# Patient Record
Sex: Female | Born: 1935 | Race: White | Hispanic: No | Smoking: Former smoker
Health system: Southern US, Community
[De-identification: ages and names within clinical notes are randomized; demographics above are authoritative.]

## PROBLEM LIST (undated history)

## (undated) DIAGNOSIS — J449 Chronic obstructive pulmonary disease, unspecified: Secondary | ICD-10-CM

## (undated) DIAGNOSIS — M160 Bilateral primary osteoarthritis of hip: Secondary | ICD-10-CM

## (undated) DIAGNOSIS — I480 Paroxysmal atrial fibrillation: Secondary | ICD-10-CM

## (undated) DIAGNOSIS — J309 Allergic rhinitis, unspecified: Secondary | ICD-10-CM

## (undated) DIAGNOSIS — J189 Pneumonia, unspecified organism: Secondary | ICD-10-CM

## (undated) DIAGNOSIS — N390 Urinary tract infection, site not specified: Secondary | ICD-10-CM

## (undated) DIAGNOSIS — M810 Age-related osteoporosis without current pathological fracture: Secondary | ICD-10-CM

## (undated) DIAGNOSIS — N3281 Overactive bladder: Secondary | ICD-10-CM

## (undated) DIAGNOSIS — R209 Unspecified disturbances of skin sensation: Secondary | ICD-10-CM

## (undated) DIAGNOSIS — S2239XA Fracture of one rib, unspecified side, initial encounter for closed fracture: Secondary | ICD-10-CM

## (undated) DIAGNOSIS — I872 Venous insufficiency (chronic) (peripheral): Secondary | ICD-10-CM

## (undated) DIAGNOSIS — M51369 Other intervertebral disc degeneration, lumbar region without mention of lumbar back pain or lower extremity pain: Secondary | ICD-10-CM

## (undated) DIAGNOSIS — E039 Hypothyroidism, unspecified: Secondary | ICD-10-CM

## (undated) DIAGNOSIS — N993 Prolapse of vaginal vault after hysterectomy: Secondary | ICD-10-CM

## (undated) DIAGNOSIS — N3021 Other chronic cystitis with hematuria: Secondary | ICD-10-CM

## (undated) DIAGNOSIS — N182 Chronic kidney disease, stage 2 (mild): Secondary | ICD-10-CM

## (undated) DIAGNOSIS — K529 Noninfective gastroenteritis and colitis, unspecified: Secondary | ICD-10-CM

## (undated) DIAGNOSIS — M48062 Spinal stenosis, lumbar region with neurogenic claudication: Secondary | ICD-10-CM

## (undated) DIAGNOSIS — H8110 Benign paroxysmal vertigo, unspecified ear: Secondary | ICD-10-CM

## (undated) DIAGNOSIS — B029 Zoster without complications: Secondary | ICD-10-CM

## (undated) DIAGNOSIS — Z9981 Dependence on supplemental oxygen: Secondary | ICD-10-CM

## (undated) DIAGNOSIS — G2581 Restless legs syndrome: Secondary | ICD-10-CM

## (undated) DIAGNOSIS — A0472 Enterocolitis due to Clostridium difficile, not specified as recurrent: Secondary | ICD-10-CM

## (undated) DIAGNOSIS — R200 Anesthesia of skin: Secondary | ICD-10-CM

## (undated) DIAGNOSIS — Z8489 Family history of other specified conditions: Secondary | ICD-10-CM

## (undated) DIAGNOSIS — M5030 Other cervical disc degeneration, unspecified cervical region: Secondary | ICD-10-CM

## (undated) DIAGNOSIS — N289 Disorder of kidney and ureter, unspecified: Secondary | ICD-10-CM

## (undated) DIAGNOSIS — A0471 Enterocolitis due to Clostridium difficile, recurrent: Secondary | ICD-10-CM

## (undated) DIAGNOSIS — R911 Solitary pulmonary nodule: Secondary | ICD-10-CM

## (undated) DIAGNOSIS — N952 Postmenopausal atrophic vaginitis: Secondary | ICD-10-CM

## (undated) DIAGNOSIS — J9611 Chronic respiratory failure with hypoxia: Secondary | ICD-10-CM

## (undated) DIAGNOSIS — D2360 Other benign neoplasm of skin of unspecified upper limb, including shoulder: Secondary | ICD-10-CM

## (undated) DIAGNOSIS — F418 Other specified anxiety disorders: Secondary | ICD-10-CM

## (undated) DIAGNOSIS — I5032 Chronic diastolic (congestive) heart failure: Secondary | ICD-10-CM

## (undated) DIAGNOSIS — I10 Essential (primary) hypertension: Secondary | ICD-10-CM

## (undated) DIAGNOSIS — M5136 Other intervertebral disc degeneration, lumbar region: Secondary | ICD-10-CM

## (undated) DIAGNOSIS — K219 Gastro-esophageal reflux disease without esophagitis: Secondary | ICD-10-CM

## (undated) DIAGNOSIS — Z8669 Personal history of other diseases of the nervous system and sense organs: Secondary | ICD-10-CM

## (undated) DIAGNOSIS — R0789 Other chest pain: Secondary | ICD-10-CM

## (undated) DIAGNOSIS — M19012 Primary osteoarthritis, left shoulder: Secondary | ICD-10-CM

## (undated) DIAGNOSIS — K5730 Diverticulosis of large intestine without perforation or abscess without bleeding: Secondary | ICD-10-CM

## (undated) DIAGNOSIS — N6019 Diffuse cystic mastopathy of unspecified breast: Secondary | ICD-10-CM

## (undated) HISTORY — DX: Venous insufficiency (chronic) (peripheral): I87.2

## (undated) HISTORY — DX: Solitary pulmonary nodule: R91.1

## (undated) HISTORY — DX: Benign paroxysmal vertigo, unspecified ear: H81.10

## (undated) HISTORY — DX: Other benign neoplasm of skin of unspecified upper limb, including shoulder: D23.60

## (undated) HISTORY — PX: OTHER SURGICAL HISTORY: SHX169

## (undated) HISTORY — DX: Allergic rhinitis, unspecified: J30.9

## (undated) HISTORY — DX: Other chronic cystitis with hematuria: N30.21

## (undated) HISTORY — DX: Unspecified disturbances of skin sensation: R20.9

## (undated) HISTORY — DX: Chronic respiratory failure with hypoxia: J96.11

## (undated) HISTORY — PX: COLONOSCOPY: SHX174

## (undated) HISTORY — DX: Noninfective gastroenteritis and colitis, unspecified: K52.9

## (undated) HISTORY — DX: Paroxysmal atrial fibrillation: I48.0

## (undated) HISTORY — DX: Enterocolitis due to Clostridium difficile, recurrent: A04.71

## (undated) HISTORY — DX: Diffuse cystic mastopathy of unspecified breast: N60.19

## (undated) HISTORY — DX: Other cervical disc degeneration, unspecified cervical region: M50.30

## (undated) HISTORY — DX: Chronic kidney disease, stage 2 (mild): N18.2

## (undated) HISTORY — DX: Other intervertebral disc degeneration, lumbar region without mention of lumbar back pain or lower extremity pain: M51.369

## (undated) HISTORY — DX: Bilateral primary osteoarthritis of hip: M16.0

## (undated) HISTORY — DX: Anesthesia of skin: R20.0

## (undated) HISTORY — DX: Restless legs syndrome: G25.81

## (undated) HISTORY — DX: Prolapse of vaginal vault after hysterectomy: N99.3

## (undated) HISTORY — DX: Other chest pain: R07.89

## (undated) HISTORY — DX: Hypothyroidism, unspecified: E03.9

## (undated) HISTORY — DX: Enterocolitis due to Clostridium difficile, not specified as recurrent: A04.72

## (undated) HISTORY — DX: Other intervertebral disc degeneration, lumbar region: M51.36

## (undated) HISTORY — DX: Gastro-esophageal reflux disease without esophagitis: K21.9

## (undated) HISTORY — DX: Spinal stenosis, lumbar region with neurogenic claudication: M48.062

## (undated) HISTORY — DX: Essential (primary) hypertension: I10

## (undated) HISTORY — PX: TONSILLECTOMY: SUR1361

## (undated) HISTORY — DX: Age-related osteoporosis without current pathological fracture: M81.0

## (undated) HISTORY — DX: Primary osteoarthritis, left shoulder: M19.012

## (undated) HISTORY — DX: Chronic diastolic (congestive) heart failure: I50.32

## (undated) HISTORY — DX: Other specified anxiety disorders: F41.8

## (undated) HISTORY — DX: Overactive bladder: N32.81

## (undated) HISTORY — DX: Diverticulosis of large intestine without perforation or abscess without bleeding: K57.30

## (undated) HISTORY — PX: BREAST CYST EXCISION: SHX579

## (undated) HISTORY — DX: Postmenopausal atrophic vaginitis: N95.2

## (undated) HISTORY — PX: APPENDECTOMY: SHX54

## (undated) HISTORY — DX: Personal history of other diseases of the nervous system and sense organs: Z86.69

## (undated) HISTORY — DX: Zoster without complications: B02.9

## (undated) HISTORY — DX: Urinary tract infection, site not specified: N39.0

## (undated) HISTORY — DX: Chronic obstructive pulmonary disease, unspecified: J44.9

## (undated) SURGERY — Surgical Case
Anesthesia: *Unknown

---
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
## 1972-02-04 HISTORY — PX: TUBAL LIGATION: SHX77

## 1976-02-04 HISTORY — PX: ABDOMINAL HYSTERECTOMY: SHX81

## 1998-01-19 ENCOUNTER — Ambulatory Visit (HOSPITAL_COMMUNITY): Admission: RE | Admit: 1998-01-19 | Discharge: 1998-01-20 | Payer: Self-pay

## 1998-12-29 ENCOUNTER — Emergency Department (HOSPITAL_COMMUNITY): Admission: EM | Admit: 1998-12-29 | Discharge: 1998-12-30 | Payer: Self-pay | Admitting: *Deleted

## 1998-12-30 ENCOUNTER — Encounter: Payer: Self-pay | Admitting: Emergency Medicine

## 1999-02-04 HISTORY — PX: CHOLECYSTECTOMY: SHX55

## 2000-07-14 ENCOUNTER — Ambulatory Visit (HOSPITAL_COMMUNITY): Admission: RE | Admit: 2000-07-14 | Discharge: 2000-07-14 | Payer: Self-pay | Admitting: Gastroenterology

## 2000-12-24 ENCOUNTER — Encounter: Payer: Self-pay | Admitting: Emergency Medicine

## 2000-12-24 ENCOUNTER — Emergency Department (HOSPITAL_COMMUNITY): Admission: EM | Admit: 2000-12-24 | Discharge: 2000-12-25 | Payer: Self-pay | Admitting: *Deleted

## 2004-02-04 LAB — HM COLONOSCOPY

## 2007-08-09 ENCOUNTER — Ambulatory Visit: Payer: Self-pay | Admitting: Pulmonary Disease

## 2007-08-09 DIAGNOSIS — J449 Chronic obstructive pulmonary disease, unspecified: Secondary | ICD-10-CM | POA: Insufficient documentation

## 2007-08-09 DIAGNOSIS — J309 Allergic rhinitis, unspecified: Secondary | ICD-10-CM | POA: Insufficient documentation

## 2007-08-10 ENCOUNTER — Telehealth (INDEPENDENT_AMBULATORY_CARE_PROVIDER_SITE_OTHER): Payer: Self-pay | Admitting: *Deleted

## 2007-09-01 ENCOUNTER — Ambulatory Visit: Payer: Self-pay | Admitting: Pulmonary Disease

## 2007-09-20 ENCOUNTER — Ambulatory Visit: Payer: Self-pay | Admitting: Pulmonary Disease

## 2007-10-06 ENCOUNTER — Encounter: Payer: Self-pay | Admitting: Pulmonary Disease

## 2007-11-08 ENCOUNTER — Ambulatory Visit: Payer: Self-pay | Admitting: Pulmonary Disease

## 2007-11-16 ENCOUNTER — Encounter (HOSPITAL_COMMUNITY): Admission: RE | Admit: 2007-11-16 | Discharge: 2008-02-03 | Payer: Self-pay | Admitting: Pulmonary Disease

## 2007-11-30 ENCOUNTER — Encounter: Payer: Self-pay | Admitting: Pulmonary Disease

## 2008-02-04 ENCOUNTER — Encounter (HOSPITAL_COMMUNITY): Admission: RE | Admit: 2008-02-04 | Discharge: 2008-02-17 | Payer: Self-pay | Admitting: Pulmonary Disease

## 2008-02-17 ENCOUNTER — Encounter: Payer: Self-pay | Admitting: Pulmonary Disease

## 2008-03-22 ENCOUNTER — Telehealth (INDEPENDENT_AMBULATORY_CARE_PROVIDER_SITE_OTHER): Payer: Self-pay | Admitting: *Deleted

## 2008-06-16 ENCOUNTER — Ambulatory Visit: Payer: Self-pay | Admitting: Diagnostic Radiology

## 2008-06-16 ENCOUNTER — Emergency Department (HOSPITAL_BASED_OUTPATIENT_CLINIC_OR_DEPARTMENT_OTHER): Admission: EM | Admit: 2008-06-16 | Discharge: 2008-06-16 | Payer: Self-pay | Admitting: Emergency Medicine

## 2008-06-19 ENCOUNTER — Ambulatory Visit: Payer: Self-pay | Admitting: Pulmonary Disease

## 2008-06-25 ENCOUNTER — Emergency Department (HOSPITAL_BASED_OUTPATIENT_CLINIC_OR_DEPARTMENT_OTHER): Admission: EM | Admit: 2008-06-25 | Discharge: 2008-06-25 | Payer: Self-pay | Admitting: Emergency Medicine

## 2008-10-30 ENCOUNTER — Encounter: Payer: Self-pay | Admitting: Family Medicine

## 2008-11-07 ENCOUNTER — Ambulatory Visit: Payer: Self-pay | Admitting: Pulmonary Disease

## 2008-11-29 ENCOUNTER — Ambulatory Visit: Payer: Self-pay | Admitting: Pulmonary Disease

## 2008-11-29 ENCOUNTER — Telehealth: Payer: Self-pay | Admitting: Pulmonary Disease

## 2009-01-10 ENCOUNTER — Ambulatory Visit: Payer: Self-pay | Admitting: Pulmonary Disease

## 2009-01-10 DIAGNOSIS — J984 Other disorders of lung: Secondary | ICD-10-CM | POA: Insufficient documentation

## 2009-01-10 DIAGNOSIS — R0902 Hypoxemia: Secondary | ICD-10-CM | POA: Insufficient documentation

## 2009-01-11 LAB — CONVERTED CEMR LAB
BUN: 9 mg/dL (ref 6–23)
CO2: 28 meq/L (ref 19–32)
Calcium: 9.5 mg/dL (ref 8.4–10.5)
Chloride: 106 meq/L (ref 96–112)
Creatinine, Ser: 0.7 mg/dL (ref 0.4–1.2)
GFR calc non Af Amer: 87.5 mL/min (ref >60)
Glucose, Bld: 92 mg/dL (ref 70–99)
Potassium: 4.1 meq/L (ref 3.5–5.1)
Sodium: 142 meq/L (ref 135–145)

## 2009-01-12 ENCOUNTER — Ambulatory Visit: Payer: Self-pay | Admitting: Cardiology

## 2009-01-16 ENCOUNTER — Telehealth (INDEPENDENT_AMBULATORY_CARE_PROVIDER_SITE_OTHER): Payer: Self-pay | Admitting: *Deleted

## 2009-02-03 LAB — CONVERTED CEMR LAB

## 2009-02-22 ENCOUNTER — Ambulatory Visit: Payer: Self-pay | Admitting: Pulmonary Disease

## 2009-02-23 ENCOUNTER — Telehealth (INDEPENDENT_AMBULATORY_CARE_PROVIDER_SITE_OTHER): Payer: Self-pay | Admitting: *Deleted

## 2009-03-01 ENCOUNTER — Encounter: Payer: Self-pay | Admitting: Family Medicine

## 2009-03-12 ENCOUNTER — Encounter: Payer: Self-pay | Admitting: Pulmonary Disease

## 2009-03-15 ENCOUNTER — Telehealth (INDEPENDENT_AMBULATORY_CARE_PROVIDER_SITE_OTHER): Payer: Self-pay | Admitting: *Deleted

## 2009-03-18 ENCOUNTER — Emergency Department (HOSPITAL_BASED_OUTPATIENT_CLINIC_OR_DEPARTMENT_OTHER): Admission: EM | Admit: 2009-03-18 | Discharge: 2009-03-18 | Payer: Self-pay | Admitting: Emergency Medicine

## 2009-03-18 ENCOUNTER — Ambulatory Visit: Payer: Self-pay | Admitting: Diagnostic Radiology

## 2009-03-19 ENCOUNTER — Telehealth: Payer: Self-pay | Admitting: Pulmonary Disease

## 2009-05-18 ENCOUNTER — Ambulatory Visit: Payer: Self-pay | Admitting: Pulmonary Disease

## 2009-05-21 LAB — CONVERTED CEMR LAB: Theophylline Lvl: 1.3 ug/mL — ABNORMAL LOW (ref 10.0–20.0)

## 2009-06-15 ENCOUNTER — Telehealth (INDEPENDENT_AMBULATORY_CARE_PROVIDER_SITE_OTHER): Payer: Self-pay | Admitting: *Deleted

## 2009-07-19 ENCOUNTER — Ambulatory Visit: Payer: Self-pay | Admitting: Cardiology

## 2009-08-01 ENCOUNTER — Ambulatory Visit: Payer: Self-pay | Admitting: Pulmonary Disease

## 2009-08-24 ENCOUNTER — Telehealth (INDEPENDENT_AMBULATORY_CARE_PROVIDER_SITE_OTHER): Payer: Self-pay | Admitting: *Deleted

## 2009-08-31 ENCOUNTER — Encounter: Payer: Self-pay | Admitting: Family Medicine

## 2009-10-29 ENCOUNTER — Telehealth (INDEPENDENT_AMBULATORY_CARE_PROVIDER_SITE_OTHER): Payer: Self-pay | Admitting: *Deleted

## 2009-11-03 LAB — HM MAMMOGRAPHY

## 2009-12-14 ENCOUNTER — Encounter: Payer: Self-pay | Admitting: Family Medicine

## 2010-01-04 ENCOUNTER — Ambulatory Visit: Payer: Self-pay | Admitting: Pulmonary Disease

## 2010-01-07 ENCOUNTER — Ambulatory Visit: Payer: Self-pay | Admitting: Family Medicine

## 2010-01-07 DIAGNOSIS — M722 Plantar fascial fibromatosis: Secondary | ICD-10-CM | POA: Insufficient documentation

## 2010-01-07 DIAGNOSIS — E063 Autoimmune thyroiditis: Secondary | ICD-10-CM

## 2010-01-07 DIAGNOSIS — I10 Essential (primary) hypertension: Secondary | ICD-10-CM | POA: Insufficient documentation

## 2010-01-07 DIAGNOSIS — E038 Other specified hypothyroidism: Secondary | ICD-10-CM | POA: Insufficient documentation

## 2010-01-07 DIAGNOSIS — Z9189 Other specified personal risk factors, not elsewhere classified: Secondary | ICD-10-CM | POA: Insufficient documentation

## 2010-01-07 DIAGNOSIS — M4800 Spinal stenosis, site unspecified: Secondary | ICD-10-CM | POA: Insufficient documentation

## 2010-01-14 ENCOUNTER — Ambulatory Visit: Payer: Self-pay | Admitting: Family Medicine

## 2010-01-14 DIAGNOSIS — J441 Chronic obstructive pulmonary disease with (acute) exacerbation: Secondary | ICD-10-CM | POA: Insufficient documentation

## 2010-01-14 DIAGNOSIS — J019 Acute sinusitis, unspecified: Secondary | ICD-10-CM | POA: Insufficient documentation

## 2010-01-15 ENCOUNTER — Encounter: Payer: Self-pay | Admitting: Pulmonary Disease

## 2010-02-01 ENCOUNTER — Ambulatory Visit
Admission: RE | Admit: 2010-02-01 | Discharge: 2010-02-01 | Payer: Self-pay | Source: Home / Self Care | Attending: Family Medicine | Admitting: Family Medicine

## 2010-02-01 DIAGNOSIS — J069 Acute upper respiratory infection, unspecified: Secondary | ICD-10-CM | POA: Insufficient documentation

## 2010-02-06 ENCOUNTER — Encounter: Payer: Self-pay | Admitting: Pulmonary Disease

## 2010-02-27 ENCOUNTER — Encounter: Payer: Self-pay | Admitting: Family Medicine

## 2010-03-05 NOTE — Assessment & Plan Note (Signed)
Summary: sob/apc   Visit Type:  Initial Consult PCP:  Corrin Parker  Chief Complaint:  asthma and copd.  History of Present Illness: I met [name] for evaluation of her dyspnea.  I recently evaluated her husband Annette Stable for his dyspnea.  After seeing his improvement she felt she needed further pulmonary evaluation to see if her symptoms could improve as well.  She has been having breathing problems for years.  She was told she had COPD.  She is not sure if she has every had breathing tests before.  She has been having a chronic cough since the 1970's.  She gets dyspneic when she walks up a hill or stairs.  She also occasionally gets hoarse.  She gets occasional wheezing, and cough with clear to yellow sputum.  She does have sinus congestion with postnasal drip, and globus sensation.  She does also get itchy eyes.  She gets hives from penicillin.  She used to have problems with shellfish, but this was more GI irritation.  She has 2 dogs.  She gets breathing problems around dust, smoke, and cats.  She has been using advair for about 6 years which has helped some.  She uses her proair intermittently.  She has been on lisinopril for years.  She had a chest xray from 2002 which showed hyperinflation.     Past Medical History:    Hypertension    COPD    Spinal Stenosis    GERD    Hypothyroidism    Depression    Insomnia  Past Surgical History:    Gallbladder 2001    Tubal Ligation 1974    Benign right breast cyst    Total Abdominal Hysterectomy   Family History:    Unremarkable  Social History:    Married.  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  Quit smoking in 1992, used to smoke 2 packs/day, and started at age 42.  Has second hand tobacco exposure.     Risk Factors:  Tobacco use:  quit    Year quit:  1992    Vital Signs:  Patient Profile:   75 Years Old Female Height:     60 inches Weight:      146.38 pounds O2 Sat:       96 % O2 treatment:    Room Air Temp:     97.8 degrees F oral Pulse rate:   68 / minute BP sitting:   108 / 64  (left arm) Cuff size:   regular  Vitals Entered By: Marinus Maw (August 09, 2007 3:47 PM)             Comments Medications reviewed with patient Marinus Maw  August 09, 2007 3:59 PM      Physical Exam  General:     normal appearance and thin.   Eyes:     PERRLA and EOMI.   Nose:     clear nasal discharge.   Mouth:     no oral lesion Neck:     no LAN, no thyromegaly Lungs:     decreased breath sounds, no wheezing or rales Heart:     regular rhythm, normal rate, and no murmurs.   Abdomen:     thin, soft, nontender Extremities:     no edema, cyanosis, or clubbing Neurologic:     normal strength    Pulmonary Function Test Height (in.): 60 Gender: Female    Problem # 1:  DYSPNEA (ICD-786.05) She has a diagnosis of asthma.  She does have a history of smoking as well as significant second hand smoke exposure.  I will arrange for her to have a chest xray and PFT's to further evaluate this.  I will continue her on advair and proair.  I will start her on spiriva one puff once daily.  Problem # 2:  COPD (ICD-496) WIll further evaluate this as above.  Problem # 3:  ALLERGIC RHINITIS (ICD-477.9) I suspect much of her symptoms are related to allergies and rhinitis.  I have advised her to use nasal irrigation and nasonex 2 sprays once daily.  If this is not improved, then further evaluation may needed for additional allergy therapies.  Problem # 4:  HYPERTENSION (ICD-401.9) She is on lisinopril.  I am not sure how much this is contributing to her respiratory symptoms.  If her symptoms are not improved, then she may need to be switched to an alternative blood pressure medication.  Medications Added to Medication List This Visit: 1)  Advair Diskus 100-50 Mcg/dose Misc (Fluticasone-salmeterol) .... One  inhalation  two times a day 2)  Proair Hfa 108  (90 Base) Mcg/act Aers (Albuterol sulfate) .... Two puffs as needed 3)  Lisinopril 20 Mg Tabs (Lisinopril) .... Take one tablet daily. 4)  Amlodipine Besylate 5 Mg Tabs (Amlodipine besylate) .... Take one tablet daily. 5)  Synthroid 88 Mcg Tabs (Levothyroxine sodium) .... Take one tablet daily. 6)  Venlafaxine Hcl 75 Mg Tabs (Venlafaxine hcl) .... Take 1/ 2 tablet daily. 7)  Diazepam 5 Mg Tabs (Diazepam) .... Take 1/2 by mouth at bedtime 8)  Percocet 5-325 Mg Tabs (Oxycodone-acetaminophen) .... Take one tablet every 6 hours as needed 9)  Spiriva Handihaler 18 Mcg Caps (Tiotropium bromide monohydrate) .... One puff once daily 10)  Nasonex 50 Mcg/act Susp (Mometasone furoate) .... 2 sprays once daily   Patient Instructions: 1)  Nasal irrigation once daily  2)  Nasonex 2 sprays once daily 3)  Spiriva one puff once daily  4)  Continue advair one puff two times a day, and rinse your mouth after using 5)  Continue ProAir 2 puffs upto 4 times per day as needed 6)  Chest Xray today 7)  Will schedule breathing test (PFT) 8)  Follow up in 3 to 4 weeks   Prescriptions: NASONEX 50 MCG/ACT  SUSP (MOMETASONE FUROATE) 2 sprays once daily  #1 x 2   Entered and Authorized by:   Coralyn Helling MD   Signed by:   Coralyn Helling MD on 08/09/2007   Method used:   [facility] to ...       [id number]       [address]       [place], [place]  [address]       Ph: [phone] or [phone]       Fax: [phone]   RxID:   [id number] SPIRIVA HANDIHALER 18 MCG  CAPS (TIOTROPIUM BROMIDE MONOHYDRATE) one puff once daily  #30 x 2   Entered and Authorized by:   Coralyn Helling MD   Signed by:   Coralyn Helling MD on 08/09/2007   Method used:   [facility] to ...       [phone]       [address]       [place], [place]  [address]  Ph: [phone] or [phone]       Fax: [phone]   RxID:   [id number]  ]

## 2010-03-05 NOTE — Assessment & Plan Note (Signed)
Summary: established visit/vfw   Vital Signs:  Patient profile:   75 year old female Height:      60.75 inches (154.31 cm) Weight:      154.50 pounds (70.23 kg) O2 Sat:      90 % on Room air Temp:     97.5 degrees F (36.39 degrees C) oral Pulse rate:   73 / minute BP sitting:   126 / 77  (right arm)  Vitals Entered By: Josph Macho RMA (January 07, 2010 9:39 AM)  O2 Flow:  Room air CC: Establish new patient/ CF Is Patient Diabetic? No   History of Present Illness: 75 y/o Cantrell here to establish care, formerly saw Dr. Dagoberto Ligas. Feeling fine today, no acute complaints. Main chronic problem is severe COPD for which she takes symbicort, spireva, and as needed proair.  Also on nocturnal oxygen.  Seeing pulmonologist regularly and flu and pneumovax are UTD. Quit smoking long ago. Asks for possible switch in diovan and venlafaxine due to insurance rules/pricing.  Preventive Screening-Counseling & Management  Alcohol-Tobacco     Alcohol drinks/day: 0     Smoking Status: quit > 6 months  Clinical Review Panels:  Prevention   Last Mammogram:  historical (11/03/2009)   Last Pap Smear:  historical (02/03/2009)   Last Colonoscopy:  historical (02/04/2004)  Immunizations   Last Flu Vaccine:  Historical (10/04/2009)  Diabetes Management   Creatinine:  0.7 (01/10/2009)   Last Flu Vaccine:  Historical (10/04/2009)   Current Medications (verified): 1)  Spiriva Handihaler 18 Mcg  Caps (Tiotropium Bromide Monohydrate) .... One Puff Every Other Day 2)  Symbicort 160-4.5 Mcg/act Aero (Budesonide-Formoterol Fumarate) .... Two Puffs Two Times A Day 3)  Proair Hfa 108 (90 Base) Mcg/act  Aers (Albuterol Sulfate) .... Two Puffs As Needed 4)  Nasonex 50 Mcg/act  Susp (Mometasone Furoate) .... 2 Sprays Once Daily 5)  Amlodipine Besylate 5 Mg  Tabs (Amlodipine Besylate) .... Take One Tablet Daily. 6)  Synthroid 88 Mcg  Tabs (Levothyroxine Sodium) .... Take One Tablet Daily. 7)  Diazepam 5 Mg   Tabs (Diazepam) .... Take 1/2 By Mouth At Bedtime 8)  Percocet 5-325 Mg  Tabs (Oxycodone-Acetaminophen) .... Take One Tablet Every 6 Hours As Needed 9)  Losartan Potassium 100 Mg Tabs (Losartan Potassium) .Marland Kitchen.. 1 Tab By Mouth Qd 10)  Venlafaxine Hcl 37.5 Mg Xr24h-Cap (Venlafaxine Hcl) .Marland Kitchen.. 1 Tab By Mouth Qd  Allergies (verified): 1)  ! Pcn 2)  ! Ace Inhibitors  Past History:  Past Medical History: Last updated: 08/01/2009 Hypertension COPD      - Spirometry 01/10/09 FEV1 0.97(52%), FVC 2.05(83%) FEV1% 47 Hypoxemia      - O2 86 % RA with exertion 01/10/09      - 2 liters oxygen with exertion and sleep Pulmonary nodule      - CT chest Dec. 2010, June 2011 Spinal Stenosis GERD Hypothyroidism Depression Insomnia Rhinitis ACE inhibitor cough  Family History: Last updated: 08/09/2007 Unremarkable  Social History: Last updated: 08/09/2007 Married.  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  Quit smoking in 1992, used to smoke 2 packs/day, and started at age 75.  Has second hand tobacco exposure.    Past Surgical History: Gallbladder 2001 Tubal Ligation 1974 Benign right breast cyst Total Abdominal Hysterectomy (no BSO per pt)--nonmalignant reasons  Social History: Smoking Status:  quit > 6 months  Physical Exam  General:  VS: noted, all normal. Gen: Alert, well appearing,  oriented x 4. No further exam today.   Impression & Recommendations:  Problem # 1:  HYPOTHYROIDISM (ICD-244.9) Continue synthroid at current dosing, will obtain records to see when last TSH was done.  Problem # 2:  ESSENTIAL HYPERTENSION (ICD-401.9) Stable: med changes due to insurance rules.  The following medications were removed from the medication list:    Diovan 80 Mg Tabs (Valsartan) ..... One by mouth once daily Her updated medication list for this problem includes:    Amlodipine Besylate 5 Mg Tabs (Amlodipine besylate) .Marland Kitchen... Take one tablet  daily.    Losartan Potassium 100 Mg Tabs (Losartan potassium) .Marland Kitchen... 1 tab by mouth qd  Problem # 3:  COPD (ICD-496) Continue current regimen and specialist f/u.   As per Dr. Craige Cotta,  she will start wearing oxygen with exertion and will be set up to get home pulse oximeter.  Her updated medication list for this problem includes:    Spiriva Handihaler 18 Mcg Caps (Tiotropium bromide monohydrate) ..... One puff every other day    Symbicort 160-4.5 Mcg/act Aero (Budesonide-formoterol fumarate) .Marland Kitchen..Marland Kitchen Two puffs two times a day    Proair Hfa 108 (90 Base) Mcg/act Aers (Albuterol sulfate) .Marland Kitchen..Marland Kitchen Two puffs as needed  Problem # 4:  SPINAL STENOSIS (ICD-724.00) Stable.  Continue pain mgmt with Dr. Vear Clock at [facility] pain clinic.  Complete Medication List: 1)  Spiriva Handihaler 18 Mcg Caps (Tiotropium bromide monohydrate) .... One puff every other day 2)  Symbicort 160-4.5 Mcg/act Aero (Budesonide-formoterol fumarate) .... Two puffs two times a day 3)  Proair Hfa 108 (90 Base) Mcg/act Aers (Albuterol sulfate) .... Two puffs as needed 4)  Nasonex 50 Mcg/act Susp (Mometasone furoate) .... 2 sprays once daily 5)  Amlodipine Besylate 5 Mg Tabs (Amlodipine besylate) .... Take one tablet daily. 6)  Synthroid 88 Mcg Tabs (Levothyroxine sodium) .... Take one tablet daily. 7)  Diazepam 5 Mg Tabs (Diazepam) .... Take 1/2 by mouth at bedtime 8)  Percocet 5-325 Mg Tabs (Oxycodone-acetaminophen) .... Take one tablet every 6 hours as needed 9)  Losartan Potassium 100 Mg Tabs (Losartan potassium) .Marland Kitchen.. 1 tab by mouth qd 10)  Venlafaxine Hcl 37.5 Mg Xr24h-cap (Venlafaxine hcl) .Marland Kitchen.. 1 tab by mouth qd  Patient Instructions: 1)  F/u appt in the next 4 months (fasting not necessary). 2)  Sign consent for records from Dr. Dagoberto Ligas. Prescriptions: NASONEX 50 MCG/ACT  SUSP (MOMETASONE FUROATE) 2 sprays once daily  #1 x 6   Entered and Authorized by:   Michell Heinrich M.D.   Signed by:   Michell Heinrich M.D. on  01/07/2010   Method used:   [facility]        [phone][facility]ail[facility]       [address] [place], [place]  [address]       Ph: [phone] or [phone]       Fax: [phone]   RxID:   [id number] VENLAFAXINE HCL 37.5 MG XR24H-CAP (VENLAFAXINE HCL) 1 tab by mouth qd  #30 x 6   Entered and Authorized by:   Michell Heinrich M.D.   Signed by:   Michell Heinrich M.D. on 01/07/2010   Method used:   [facility]        [facility] [phone]       [address]  [place], [place]  [address]       Ph: [phone] or [phone]       Fax: [phone]   RxID:   [id number] LOSARTAN POTASSIUM 100 MG TABS (LOSARTAN POTASSIUM) 1 tab by mouth qd  #30 x 6   Entered and Authorized by:   Michell Heinrich M.D.   Signed by:   Michell Heinrich M.D. on 01/07/2010   Method used:   [facility]        [phone][facility] ([facility]ail)       [address] [place], [place]  [address]       Ph: [phone] or [phone]       Fax: [phone]   RxID:   [id number]    Orders Added: 1)  New Patient Level III [[address]]    Preventive Care Screening  Bone Density:    Date:  11/03/2009    Results:  historical std dev  Mammogram:    Date:  11/03/2009    Results:  historical   Pap Smear:    Date:  02/03/2009    Results:  historical   Colonoscopy:    Date:  02/04/2004    Results:  historical

## 2010-03-05 NOTE — Progress Notes (Signed)
Summary: nurse call  Phone Note Call from Patient Call back at Home Phone [phone]   Caller: Patient Call For: Priscilla Cantrell Reason for Call: Talk to Nurse Summary of Call: pt went ed, had breathing treatment, need to speak to nurse about next move. Initial call taken by: Priscilla Cantrell,  March 19, 2009 12:03 PM  Follow-up for Phone Call        Pt just wanted to make Dr. Craige Cotta aware that she went to ER over the weekend. They gave her breathing treatment and an antibiotic. Pt states her cough is better as well as her breathing.  She just wanted VS to be aware and if he had any recs. Priscilla Cantrell CMA  March 19, 2009 12:16 PM

## 2010-03-05 NOTE — Procedures (Signed)
Summary: Oximetry/[facility]  Oximetry/[facility]   Imported By: Sherian Rein 03/16/2009 14:03:06  _____________________________________________________________________  External Attachment:    Type:   Image     Comment:   External Document

## 2010-03-05 NOTE — Assessment & Plan Note (Signed)
Summary: PER PT CALL/PT DUE/[facility]   Visit Type:  Follow-up Primary Provider/Referring Provider:  Corrin Parker  CC:  COPD follow-up...pt c/o slight increased sob with exertion and dry hacky cough x6-7 days.  History of Present Illness: 75 yo female with GOLD 3 COPD, ACE related cough, pulmonary nodule, and hypoxemia on 2 liters oxygen with exertion and sleep.  She has been more short of breath.  She gets winded more easily with activity.  She is better when she uses her proair air a head of activity.  She has gained some weight.  She has a dry, hacking cough for the past few days.  She is not having wheeze, fever, sputum, or hemoptysis.  She gets an occasional ache in her chest, but this only lasts for a few seconds.  She is using symbicort two times a day, and spiriva once daily.  She does not use proair everyday.  She feels like she is struggling to keep up with everything going on at home, work, and with her health and her husbands health.  Current Medications (verified): 1)  Spiriva Handihaler 18 Mcg  Caps (Tiotropium Bromide Monohydrate) .... One Puff Every Other Day 2)  Symbicort 160-4.5 Mcg/act Aero (Budesonide-Formoterol Fumarate) .... Two Puffs Two Times A Day 3)  Theo-24 100 Mg Xr24h-Cap (Theophylline) .... One By Mouth Once Daily 4)  Proair Hfa 108 (90 Base) Mcg/act  Aers (Albuterol Sulfate) .... Two Puffs As Needed 5)  Nasonex 50 Mcg/act  Susp (Mometasone Furoate) .... 2 Sprays Once Daily 6)  Diovan 80 Mg  Tabs (Valsartan) .... One By Mouth Once Daily 7)  Amlodipine Besylate 5 Mg  Tabs (Amlodipine Besylate) .... Take One Tablet Daily. 8)  Synthroid 88 Mcg  Tabs (Levothyroxine Sodium) .... Take One Tablet Daily. 9)  Venlafaxine Hcl 75 Mg  Tabs (Venlafaxine Hcl) .... Take 1/ 2 Tablet Daily. 10)  Diazepam 5 Mg  Tabs (Diazepam) .... Take 1/2 By Mouth At Bedtime 11)  Percocet 5-325 Mg  Tabs (Oxycodone-Acetaminophen) .... Take One Tablet Every 6 Hours As Needed  Allergies  (verified): 1)  ! Pcn  Past History:  Past Medical History: Last updated: 08/01/2009 Hypertension COPD      - Spirometry 01/10/09 FEV1 0.97(52%), FVC 2.05(83%) FEV1% 47 Hypoxemia      - O2 86 % RA with exertion 01/10/09      - 2 liters oxygen with exertion and sleep Pulmonary nodule      - CT chest Dec. 2010, June 2011 Spinal Stenosis GERD Hypothyroidism Depression Insomnia Rhinitis ACE inhibitor cough  Past Surgical History: Last updated: 08/09/2007 Gallbladder 2001 Tubal Ligation 1974 Benign right breast cyst Total Abdominal Hysterectomy  Social History: Last updated: 08/09/2007 Married.  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  Quit smoking in 1992, used to smoke 2 packs/day, and started at age 44.  Has second hand tobacco exposure.    Vital Signs:  Patient profile:   75 year old female Height:      60 inches (152.40 cm) Weight:      158.25 pounds (71.93 kg) BMI:     31.02 O2 Sat:      94 % on Room air Temp:     97.6 degrees F (36.44 degrees C) oral Pulse rate:   73 / minute BP sitting:   120 / 78  (left arm) Cuff size:   regular  Vitals Entered By: Michel Bickers CMA (January 04, 2010 3:39 PM)  O2  Sat at Rest %:  94 O2 Flow:  Room air CC: COPD follow-up...pt c/o slight increased sob with exertion and dry hacky cough x6-7 days Comments Medications reviewed with patient Michel Bickers CMA  January 04, 2010 3:49 PM   Physical Exam  General:  normal appearance.   Nose:  clear nasal discharge.   Mouth:  no deformity or lesions Neck:  no JVD.   Lungs:  prolonged exhalation, decreased breath sounds, no wheezing or rales Heart:  regular rate and rhythm, S1, S2 without murmurs, rubs, gallops, or clicks Extremities:  minimal ankle edema Neurologic:  normal CN II-XII and strength normal.   Cervical Nodes:  no significant adenopathy Psych:  depressed affect.     Impression & Recommendations:  Problem # 1:  COPD  (ICD-496) I suspect much of her current symptoms are related to deconditioning and natural progression of her disease.  She has not noticed any benefit from theophylline.  Will stop this, and see how this affects her breathing.  Will continue her other regimen.  Encouraged her to maintain her activity level as best as she can.  Problem # 2:  PULMONARY NODULE (ICD-518.89)  This has been stable.  Will plan to repeat CT chest in June 2012.  Problem # 3:  HYPOXEMIA (ICD-799.02)  She is to continue oxygen at 2 liters with exertion and sleep.  Will arrange for ONO on 2 liters.  Will arrange for pulse oximeter at home since her oxygen level does tend to fluctuate.  Problem # 4:  ALLERGIC RHINITIS (ICD-477.9) She is to continue her sinus regimen.  Complete Medication List: 1)  Spiriva Handihaler 18 Mcg Caps (Tiotropium bromide monohydrate) .... One puff every other day 2)  Symbicort 160-4.5 Mcg/act Aero (Budesonide-formoterol fumarate) .... Two puffs two times a day 3)  Proair Hfa 108 (90 Base) Mcg/act Aers (Albuterol sulfate) .... Two puffs as needed 4)  Nasonex 50 Mcg/act Susp (Mometasone furoate) .... 2 sprays once daily 5)  Diovan 80 Mg Tabs (Valsartan) .... One by mouth once daily 6)  Amlodipine Besylate 5 Mg Tabs (Amlodipine besylate) .... Take one tablet daily. 7)  Synthroid 88 Mcg Tabs (Levothyroxine sodium) .... Take one tablet daily. 8)  Venlafaxine Hcl 75 Mg Tabs (Venlafaxine hcl) .... Take 1/ 2 tablet daily. 9)  Diazepam 5 Mg Tabs (Diazepam) .... Take 1/2 by mouth at bedtime 10)  Percocet 5-325 Mg Tabs (Oxycodone-acetaminophen) .... Take one tablet every 6 hours as needed  Other Orders: Est. Patient Level III ([address]) Internal Medicine Referral (Internal) DME Referral (DME)  Patient Instructions: 1)  Stop theophylline 2)  Use oxygen with activity during the day and sleep at night 3)  Follow up in 4 months   Immunization History:  Influenza Immunization History:     Influenza:  historical (10/04/2009)   Appended Document: PER PT CALL/PT DUE/[facility] Ambulatory Pulse Oximetry  Resting; HR__80___    02 Sat__93% on room air___  Lap1 (185 feet)   HR__97___   02 Sat_92% on room air____ Lap2 (185 feet)   HR__111___   02 Sat_86% on room air____    Lap3 (185 feet)   HR_____   02 Sat_____  ___Test Completed without Difficulty _x__Test Stopped due to: patients sats dropped to 86% on room air after walking 2 laps...patient placed on 2 liters of oxygen and her sats recovered to 98%. Pulse was 84.

## 2010-03-05 NOTE — Progress Notes (Signed)
Summary: out of symbicort---rx sent to pharmacy.  Phone Note Call from Patient Call back at Home Phone [phone]   Caller: Patient Call For: sood Summary of Call: pt ran out of symbicort- [facility] as[facility]. (she thought if she called pharm that it would take longer0  Initial call taken by: Tivis Ringer, CNA,  October 29, 2009 12:34 PM  Follow-up for Phone Call        [facility] informing pt rx sent to pharmacy.  Aundra Millet Reynolds LPN  October 29, 2009 1:50 PM     Prescriptions: SYMBICORT 160-4.5 MCG/ACT AERO (BUDESONIDE-FORMOTEROL FUMARATE) two puffs two times a day  #10.2 Inhaler x 2   Entered by:   Arman Filter LPN   Authorized by:   Coralyn Helling MD   Signed by:   Arman Filter LPN on 03/47/4259   Method used:   [facility]        [phone]       [address] [place], [place]  [address]       Ph: [phone] or [phone]       Fax: [phone]   RxID:   [id number]

## 2010-03-05 NOTE — Miscellaneous (Signed)
Summary: Orders Update pft charges  Clinical Lists Changes  Orders: Added new Service order of Carbon Monoxide diffusing w/capacity (94720) - Signed Added new Service order of Lung Volumes (94240) - Signed Added new Service order of Spirometry (Pre & Post) (94060) - Signed 

## 2010-03-05 NOTE — Assessment & Plan Note (Signed)
Summary: 3 months/ mbw   Primary Provider/Referring Provider:  Corrin Parker  CC:  3 mo follow up.  Pt states breathing has been "up and down."  having inc SOB with activity, states she has to occasionally use proair at night x3-4 wks-has never had to do this before.  was started on symbicort at last ov-believes advair was helping more., and Hypertension Management.  History of Present Illness: 75 year old female with hx COPD, HTN, GERD, cough r/t ACE.  She was treated for an exacerbation in October.  She improved some, but is still having difficulty with her breathing.  This is more prominent with exertion.  She is not sure if advair worked better for her.  She  does not have much of a cough or sputum.  She has been getting some sinus congestion.  She is using her nasal rinse.  When she does bring up sputum it is thick and creamy.  She denies hemoptysis, and does not get much wheeze.  She has been using loratidine once daily.  Her husband was hospitalized last week for atrial fibrillation and NSTEMI.  Hypertension History:      Positive major cardiovascular risk factors include female age 61 years old or older and hypertension.  Negative major cardiovascular risk factors include non-tobacco-user status.    CXR  Procedure date:  01/10/2009  Findings:      CHEST - 2 VIEW   Comparison: 08/09/2007   Findings: Trachea is midline.  Heart size normal.  There are changes of COPD.  A vague nodular density projects over the left upper lobe, at the junction of the left sixth posterior and left third anterior ribs.  It is not well seen on the prior study. Bibasilar scarring.  No pleural fluid.   IMPRESSION: COPD.  Vague nodular density in the upper left hemithorax may represent a summation shadow.  Pulmonary nodule cannot be excluded. Chest CT is recommended.  This was discussed with Lawson Fiscal, in Dr. Evlyn Courier office, at 3:10pm on 01/10/2009.   Read By:  Reyes Ivan.,  M.D.         Current Medications (verified): 1)  Spiriva Handihaler 18 Mcg  Caps (Tiotropium Bromide Monohydrate) .... One Puff Once Daily 2)  Symbicort 160-4.5 Mcg/act Aero (Budesonide-Formoterol Fumarate) .... Two Puffs Two Times A Day 3)  Proair Hfa 108 (90 Base) Mcg/act  Aers (Albuterol Sulfate) .... Two Puffs As Needed 4)  Nasonex 50 Mcg/act  Susp (Mometasone Furoate) .... 2 Sprays Once Daily 5)  Diovan 80 Mg  Tabs (Valsartan) .... One By Mouth Once Daily 6)  Amlodipine Besylate 5 Mg  Tabs (Amlodipine Besylate) .... Take One Tablet Daily. 7)  Synthroid 88 Mcg  Tabs (Levothyroxine Sodium) .... Take One Tablet Daily. 8)  Venlafaxine Hcl 75 Mg  Tabs (Venlafaxine Hcl) .... Take 1/ 2 Tablet Daily. 9)  Diazepam 5 Mg  Tabs (Diazepam) .... Take 1/2 By Mouth At Bedtime 10)  Percocet 5-325 Mg  Tabs (Oxycodone-Acetaminophen) .... Take One Tablet Every 6 Hours As Needed 11)  Flax Seed Oil 1000 Mg Caps (Flaxseed (Linseed)) .... Once Daily  Allergies (verified): 1)  ! Pcn  Past History:  Past Medical History: Hypertension COPD      - Spirometry 01/10/09 FEV1 0.97(52%), FVC 2.05(83%) FEV1% 47 Hypoxemia      - O2 86 % RA with exertion 01/10/09      - 2 liters oxygen with exertion and sleep Spinal Stenosis GERD Hypothyroidism Depression Insomnia Rhinitis ACE inhibitor cough  Past Surgical History: Reviewed history from 08/09/2007 and no changes required. Gallbladder 2001 Tubal Ligation 1974 Benign right breast cyst Total Abdominal Hysterectomy  Social History: Reviewed history from 08/09/2007 and no changes required. Married.  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  Quit smoking in 1992, used to smoke 2 packs/day, and started at age 84.  Has second hand tobacco exposure.    Vital Signs:  Patient profile:   75 year old female Height:      60 inches Weight:      155.13 pounds BMI:     30.41 O2 Sat:      88 % on Room air Temp:     98.0 degrees F  oral Pulse rate:   84 / minute BP sitting:   134 / 76  (left arm) Cuff size:   regular  Vitals Entered By: Gweneth Dimitri RN (January 10, 2009 2:23 PM)  O2 Flow:  Room air  O2 Sat Comments pt arrived to exam room with 02 sat of 88%ra. After resting for a few minutes, o2 sat inc to 93% ra with pulse of 66.  Serial Vital Signs/Assessments:  Comments: Ambulatory Pulse Oximetry  Resting; HR__73___    02 Sat__94%RA___  Lap1 (185 feet)   HR_96____   02 Sat__91%RA___ Lap2 (185 feet)   HR_104____   02 Sat__86%RA___    Lap3 (185 feet)   HR_____   02 Sat_____  ___Test Completed without Difficulty _x__Test Stopped due to: pt became S.O.B and desat to 86%RA Zackery Barefoot CMA  January 10, 2009 3:29 PM    By: Zackery Barefoot CMA   CC: 3 mo follow up.  Pt states breathing has been "up and down."  having inc SOB with activity, states she has to occasionally use proair at night x3-4 wks-has never had to do this before.  was started on symbicort at last ov-believes advair was helping more., Hypertension Management Comments Medications reviewed with patient Gweneth Dimitri RN  January 10, 2009 2:23 PM    Physical Exam  General:  normal appearance.   Nose:  clear nasal discharge.   Mouth:  no deformity or lesions Neck:  no masses, thyromegaly, or abnormal cervical nodes Lungs:  prolonged exhalation, decreased breath sounds, no wheezing or rales Heart:  regular rate and rhythm, S1, S2 without murmurs, rubs, gallops, or clicks Abdomen:  bowel sounds positive; abdomen soft and non-tender without masses, or organomegaly Extremities:  no clubbing, cyanosis, edema, or deformity noted Cervical Nodes:  no significant adenopathy   Pulmonary Function Test Date: 01/10/2009 3:14 PM Gender: Female  Pre-Spirometry FVC    Value: 2.05 L/min   % Pred: 82.90 % FEV1    Value: 0.97 L     Pred: 1.86 L     % Pred: 52.40 % FEV1/FVC  Value: 47.44 %     % Pred: 62.70 %  Comments: Severe  obstruction.  Impression & Recommendations:  Problem # 1:  COPD (ICD-496) She has GOLD stage 3/4 COPD.  Will continue her current inhaler regimen.  I will also start her on low dose theophylline.  Problem # 2:  HYPOXEMIA (ICD-799.02) She has significant oxygen desaturation with exertion.  I will start her on 2 liters oxygen with exertion and sleep.  I will also arrange for an overnight oximetry on 2 liters.  Problem # 3:  PULMONARY NODULE (ICD-518.89) She has a vague nodular density in left upper lung on chest xray today.  Will arrange for CT chest with contrast to further evaluate.  Medications Added to Medication List This Visit: 1)  Theo-24 100 Mg Xr24h-cap (Theophylline) .... One by mouth once daily  Complete Medication List: 1)  Spiriva Handihaler 18 Mcg Caps (Tiotropium bromide monohydrate) .... One puff once daily 2)  Symbicort 160-4.5 Mcg/act Aero (Budesonide-formoterol fumarate) .... Two puffs two times a day 3)  Proair Hfa 108 (90 Base) Mcg/act Aers (Albuterol sulfate) .... Two puffs as needed 4)  Nasonex 50 Mcg/act Susp (Mometasone furoate) .... 2 sprays once daily 5)  Diovan 80 Mg Tabs (Valsartan) .... One by mouth once daily 6)  Amlodipine Besylate 5 Mg Tabs (Amlodipine besylate) .... Take one tablet daily. 7)  Synthroid 88 Mcg Tabs (Levothyroxine sodium) .... Take one tablet daily. 8)  Venlafaxine Hcl 75 Mg Tabs (Venlafaxine hcl) .... Take 1/ 2 tablet daily. 9)  Diazepam 5 Mg Tabs (Diazepam) .... Take 1/2 by mouth at bedtime 10)  Percocet 5-325 Mg Tabs (Oxycodone-acetaminophen) .... Take one tablet every 6 hours as needed 11)  Flax Seed Oil 1000 Mg Caps (Flaxseed (linseed)) .... Once daily 12)  Theo-24 100 Mg Xr24h-cap (Theophylline) .... One by mouth once daily  Other Orders: Spirometry w/Graph (94010) Est. Patient Level III (81191) T-2 View CXR (71020TC) DME Referral (DME) Radiology Referral (Radiology) TLB-BMP (Basic Metabolic Panel-BMET) (80048-METABOL)   Hypertension Assessment/Plan:      The patient's hypertensive risk group is category B: At least one risk factor (excluding diabetes) with no target organ damage.  Today's blood pressure is 134/76.    Patient Instructions: 1)  Theo-24, 100 mg once daily  2)  Will arrange for oxygen set up 3)  Will arrange for CT chest 4)  Will call to schedule follow up after review of CT chest Prescriptions: THEO-24 100 MG XR24H-CAP (THEOPHYLLINE) one by mouth once daily  #30 x 3   Entered and Authorized by:   Coralyn Helling MD   Signed by:   Coralyn Helling MD on 01/10/2009   Method used:   [phone][facility]ail[facility]       [address] [place], [place]  [address]       Ph: [phone] or [phone]       Fax: [phone]   RxID:   [id number]    [employer] Spirometry  ID: [id number] Patient: Priscilla Cantrell, Priscilla Cantrell DOB: [birth year] Age: 75 Years Old Sex: Female Race: Other Height: 60 Weight: 155.13 Smoker: No Status: Confirmed Past Medical History:  Hypertension COPD Spinal Stenosis GERD Hypothyroidism Depression Insomnia Rhinitis ACE inhibitor cough Recorded: 01/10/2009 3:14 PM  Parameter  Measured Predicted %Predicted FVC     2.05        2.47        82.90 FEV1     0.97        1.86        52.40 FEV1%   47.44        75.72        62.70 PEF    2.74        4.95        55.40   Interpretation: Severe Obstruction.

## 2010-03-05 NOTE — Progress Notes (Signed)
Summary: congested and cough  Phone Note Call from Patient Call back at [phone]   Caller: Patient Call For: Donatella Walski Summary of Call: pt congested with cough and green mucus taking mucinex [facility]  Initial call taken by: Rickard Patience,  November 29, 2008 9:23 AM  Follow-up for Phone Call        Pt c/o chest congestion, productive cough with green phlegm, and nasal congestion x 1 week. Scheduled pt to see KW at 2:30. Pt aware. Carron Curie CMA  November 29, 2008 9:52 AM

## 2010-03-05 NOTE — Progress Notes (Signed)
Summary: rx  Phone Note Call from Patient Call back at Home Phone [phone] Call back at Work Phone [phone]   Caller: Patient Call For: sood Reason for Call: Talk to Nurse Summary of Call: pt feeling worse today,awoke w/cough, wheezing, getting up greenish plegm, thinks it may be bronchitis.  zpac rx you gave her last time has expired.  What do you recommend?  Can you call something in? [phone] Initial call taken by: Eugene Gavia,  February 23, 2009 11:08 AM  Follow-up for Phone Call        Pt was just seen by Dr Craige Cotta on 1/20.  She now has prod cough with green sputum and c/o wheezing.  She had rx for zpack but this has expired.  Wants something called in.  Will forward to doc of the day.  Please advise thanks! Vernie Murders  February 23, 2009 11:11 AM  per rb on 1/21-(this was handwritten so i am now putting in the system)  should give dr sood's recs. more time call back if symptoms worsen, spoke with pt on 1/26 and she states she is feeling betterdoesn't think she needs anything else. msg was in rb's box not signed so since pt if taken care of i am signing note today--i did call pt back again today to verify she is fine and does not need anything else from [place] at this point  Follow-up by: Philipp Deputy CMA,  March 09, 2009 12:11 PM  Additional Follow-up for Phone Call Additional follow up Details #1::            Additional Follow-up for Phone Call Additional follow up Details #2::    lmtcb   Philipp Deputy [facility]  March 08, 2009 5:13 PM

## 2010-03-05 NOTE — Progress Notes (Signed)
Summary: Advair refill  Phone Note Call from Patient   Caller: Patient Summary of Call: HAVE QUESTIONS ABOUT HER MEDICATIONS Initial call taken by: Rickard Patience,  March 22, 2008 10:19 AM  Follow-up for Phone Call        Called spoke with pt.  Pt was rx Diovan by [facility] to replace Lisinopril.  Electronic refill auth denied  advised pt to have primary MD refill BP med.  Pt states she also needs refill on Advair.  Advised pt will sent [facility].  Pt aware. Follow-up by: Cloyde Reams RN,  March 22, 2008 10:25 AM      Prescriptions: ADVAIR DISKUS 100-50 MCG/DOSE  MISC (FLUTICASONE-SALMETEROL) One  inhalation  two times a day  #1 x 5   Entered by:   Cloyde Reams RN   Authorized by:   Coralyn Helling MD   Signed by:   Cloyde Reams RN on 03/22/2008   Method used:   [facility]ically to        [phone][facility] ([facility]ail)       [address] [place], [place]  [address]       Ph: [phone] or [phone]       Fax: [phone]   RxID:   [id number]

## 2010-03-05 NOTE — Assessment & Plan Note (Signed)
Summary: f/u ///kp   Primary Provider/Referring Provider:  Corrin Parker  CC:  fu visit....COPD...hoarseness.....  History of Present Illness: I saw Priscilla Cantrell in follow up for her COPD with asthma, rhinitis and ACE inhibitor cough.  Her allergies have gotten worse this season.  She has been using claritin and nasonex as needed.  She has been getting hoarse from advair at times.  Otherwise her breathing has been doing well.  She denies cough, wheeze, sputum, or hemoptysis.  Past History:  Past Medical History:    Reviewed history from 11/08/2007 and no changes required:    Hypertension    COPD    Spinal Stenosis    GERD    Hypothyroidism    Depression    Insomnia    Rhinitis    ACE inhibitor cough  Past Surgical History:    Reviewed history from 08/09/2007 and no changes required:    Gallbladder 2001    Tubal Ligation 1974    Benign right breast cyst    Total Abdominal Hysterectomy  Vital Signs:  Patient profile:   75 year old female Weight:      152.50 pounds BMI:     29.89 O2 Sat:      90 % Temp:     97.6 degrees F oral Pulse rate:   86 / minute BP sitting:   130 / 70  (left arm) Cuff size:   regular  Vitals Entered By: Clarise Cruz Duncan Dull) (Jun 19, 2008 3:01 PM)  O2 Sat at Rest %:  90 O2 Flow:  room air  Physical Exam  General:  normal appearance.   Nose:  clear nasal discharge.   Mouth:  no oral lesion Neck:  no LAN, no thyromegaly Lungs:  decreased breath sounds, no wheezing or rales Heart:  regular rhythm, normal rate, and no murmurs.   Abdomen:  thin, soft, nontender Extremities:  no edema, cyanosis, or clubbing Cervical Nodes:  no significant adenopathy   Impression & Recommendations:  Problem # 1:  COPD (ICD-496) She has been doing well.  Will continue her on spiriva.  Will decrease advair to one puff at bedtime, and if ok after a few weeks will try to d/c advair.  Problem # 2:  ALLERGIC RHINITIS (ICD-477.9)  Advised her to continue  nasal irrigation, and continue nasonex and claritin as needed.  Problem # 3:  HOARSENESS (ICD-784.49)  We may need to change her from advair to symbicort if she needs to stay on ICS.  Complete Medication List: 1)  Spiriva Handihaler 18 Mcg Caps (Tiotropium bromide monohydrate) .... One puff once daily 2)  Advair Diskus 100-50 Mcg/dose Misc (Fluticasone-salmeterol) .... One  inhalation  two times a day 3)  Proair Hfa 108 (90 Base) Mcg/act Aers (Albuterol sulfate) .... Two puffs as needed 4)  Nasonex 50 Mcg/act Susp (Mometasone furoate) .... 2 sprays once daily 5)  Diovan 80 Mg Tabs (Valsartan) .... One by mouth once daily 6)  Amlodipine Besylate 5 Mg Tabs (Amlodipine besylate) .... Take one tablet daily. 7)  Synthroid 88 Mcg Tabs (Levothyroxine sodium) .... Take one tablet daily. 8)  Venlafaxine Hcl 75 Mg Tabs (Venlafaxine hcl) .... Take 1/ 2 tablet daily. 9)  Diazepam 5 Mg Tabs (Diazepam) .... Take 1/2 by mouth at bedtime 10)  Percocet 5-325 Mg Tabs (Oxycodone-acetaminophen) .... Take one tablet every 6 hours as needed  Other Orders: Est. Patient Level II (09811)  Patient Instructions: 1)  Spiriva one puff once daily 2)  Advair one puff  in evening for 1 to 2 weeks, and if ok stop advair 3)  Continue nasal irrigation 4)  Use nasonex after nasal irrigation 5)  Use claritin as needed  6)  Follow up in 4 to 6 months

## 2010-03-05 NOTE — Assessment & Plan Note (Signed)
Summary: Acute OV - URI   Primary Provider/Referring Provider:  Corrin Parker  CC:  prod cough with green sputum, green nasal drainage, increased SOB, some wheezing, and low grade temp x1week - states delsym and mucinex do not help.  History of Present Illness: 75 year old female with hx COPD, HTN, GERD, cough r/t ACE.  November 29, 2008 - Acute OV - 1 week hx severe cough productive of green sputum as well as green sinus drainage.  Has had fevers/ chills.  Also c/o increased SOB and increased rescue inhaler use. Has taken mucinex and delsym without improvement. Denies hemoptysis, chest pain, orthopnea, PND, recent travel or antibiotics.    Medications Prior to Update: 1)  Spiriva Handihaler 18 Mcg  Caps (Tiotropium Bromide Monohydrate) .... One Puff Once Daily 2)  Symbicort 160-4.5 Mcg/act Aero (Budesonide-Formoterol Fumarate) .... Two Puffs Two Times A Day 3)  Proair Hfa 108 (90 Base) Mcg/act  Aers (Albuterol Sulfate) .... Two Puffs As Needed 4)  Nasonex 50 Mcg/act  Susp (Mometasone Furoate) .... 2 Sprays Once Daily 5)  Diovan 80 Mg  Tabs (Valsartan) .... One By Mouth Once Daily 6)  Amlodipine Besylate 5 Mg  Tabs (Amlodipine Besylate) .... Take One Tablet Daily. 7)  Synthroid 88 Mcg  Tabs (Levothyroxine Sodium) .... Take One Tablet Daily. 8)  Venlafaxine Hcl 75 Mg  Tabs (Venlafaxine Hcl) .... Take 1/ 2 Tablet Daily. 9)  Diazepam 5 Mg  Tabs (Diazepam) .... Take 1/2 By Mouth At Bedtime 10)  Percocet 5-325 Mg  Tabs (Oxycodone-Acetaminophen) .... Take One Tablet Every 6 Hours As Needed 11)  Flax Seed Oil 1000 Mg Caps (Flaxseed (Linseed)) .... Once Daily  Current Medications (verified): 1)  Spiriva Handihaler 18 Mcg  Caps (Tiotropium Bromide Monohydrate) .... One Puff Once Daily 2)  Symbicort 160-4.5 Mcg/act Aero (Budesonide-Formoterol Fumarate) .... Two Puffs Two Times A Day 3)  Proair Hfa 108 (90 Base) Mcg/act  Aers (Albuterol Sulfate) .... Two Puffs As Needed 4)  Nasonex 50 Mcg/act  Susp  (Mometasone Furoate) .... 2 Sprays Once Daily 5)  Diovan 80 Mg  Tabs (Valsartan) .... One By Mouth Once Daily 6)  Amlodipine Besylate 5 Mg  Tabs (Amlodipine Besylate) .... Take One Tablet Daily. 7)  Synthroid 88 Mcg  Tabs (Levothyroxine Sodium) .... Take One Tablet Daily. 8)  Venlafaxine Hcl 75 Mg  Tabs (Venlafaxine Hcl) .... Take 1/ 2 Tablet Daily. 9)  Diazepam 5 Mg  Tabs (Diazepam) .... Take 1/2 By Mouth At Bedtime 10)  Percocet 5-325 Mg  Tabs (Oxycodone-Acetaminophen) .... Take One Tablet Every 6 Hours As Needed 11)  Flax Seed Oil 1000 Mg Caps (Flaxseed (Linseed)) .... Once Daily  Allergies (verified): 1)  ! Pcn  Past History:  Family History: Last updated: 08/09/2007 Unremarkable  Social History: Last updated: 08/09/2007 Married.  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  Quit smoking in 1992, used to smoke 2 packs/day, and started at age 37.  Has second hand tobacco exposure.    Risk Factors: Smoking Status: quit (08/09/2007)  Review of Systems       The patient complains of shortness of breath with activity, shortness of breath at rest, productive cough, sore throat, headaches, nasal congestion/difficulty breathing through nose, change in color of mucus, and fever.  The patient denies non-productive cough, coughing up blood, chest pain, irregular heartbeats, acid heartburn, indigestion, loss of appetite, weight change, abdominal pain, difficulty swallowing, tooth/dental problems, sneezing, itching, ear ache, anxiety, depression,  hand/feet swelling, joint stiffness or pain, and rash.         See HPI  Vital Signs:  Patient profile:   75 year old female Height:      60 inches Weight:      159.38 pounds BMI:     31.24 O2 Sat:      93 % on Room air Temp:     98.9 degrees F oral Pulse rate:   69 / minute BP sitting:   126 / 74  (left arm) Cuff size:   regular  Vitals Entered By: Boone Master CNA (November 29, 2008 2:51 PM)  O2  Flow:  Room air CC: prod cough with green sputum, green nasal drainage, increased SOB, some wheezing, low grade temp x1week - states delsym and mucinex do not help Is Patient Diabetic? No Comments Medications reviewed with patient Daytime contact number verified with patient. Boone Master CNA  November 29, 2008 2:52 PM    Physical Exam  General:  normal appearance.   Head:  normocephalic and atraumatic Eyes:  PERRLA/EOM intact; conjunctiva and sclera clear Ears:  TMs intact and clear with normal canals Nose:  purulent nasal discharge.   Mouth:  postnasal drip.   Neck:  no masses, thyromegaly, or abnormal cervical nodes Lungs:  Mild bibasilar ronchi. No wheeze. No increased WOB.  Heart:  regular rate and rhythm, S1, S2 without murmurs, rubs, gallops, or clicks Abdomen:  bowel sounds positive; abdomen soft and non-tender without masses, or organomegaly Msk:  no deformity or scoliosis noted with normal posture Extremities:  no clubbing, cyanosis, edema, or deformity noted Skin:  intact without lesions or rashes   Impression & Recommendations:  Problem # 1:  URI (ICD-465.9)  No wheezes or increased WOB.   Avelox 400mg  by mouth daily x 7 days mucinex DM 2 tabs two times a day  Continue nasal irrigation and nasonex Drink lots of extra fluids follow up with Dr. Craige Cotta as preciously scheduled Call office for sooner follow up if needed   Orders: Est. Patient Level III (93235)  Medications Added to Medication List This Visit: 1)  Avelox 400 Mg Tabs (Moxifloxacin hcl) .Marland Kitchen.. 1 tab by mouth daily x 7 days  Patient Instructions: 1)  Avelox 400mg  by mouth daily x 7 days 2)  mucinex DM 2 tabs two times a day  3)  Continue nasal irrigation and nasonex 4)  Drink lots of extra fluids 5)  follow up with Dr. Craige Cotta as preciously scheduled 6)  Call office for sooner follow up if needed  Prescriptions: AVELOX 400 MG TABS (MOXIFLOXACIN HCL) 1 tab by mouth daily x 7 days  #7 x 0   Entered and  Authorized by:   Danford Bad NP   Signed by:   Danford Bad NP on 11/29/2008   Method used:   Samples Given   RxID:   [id number]

## 2010-03-05 NOTE — Assessment & Plan Note (Signed)
Summary: 4 months/apc   Primary Provider/Referring Provider:  Corrin Parker  CC:  COPD.  The patient says she is clearing her throat more and c/o a dry couth..  History of Present Illness: 75 year old female with hx GOLD 3/4 COPD, HTN, GERD, cough r/t ACE, hypoxemia on 2L oxygen with exertion and sleep, and pulmonary nodule.  She has a dry cough since her allergies have flared up some.  This is not bad.  She has also been getting a dry mouth.  She gets some junk in her throat at times.  She has some sinus congestion, but not too bad.  She is not having wheeze, or chest congestion.  Her breathing feels okay.   Current Medications (verified): 1)  Spiriva Handihaler 18 Mcg  Caps (Tiotropium Bromide Monohydrate) .... One Puff Once Daily 2)  Symbicort 160-4.5 Mcg/act Aero (Budesonide-Formoterol Fumarate) .... Two Puffs Two Times A Day 3)  Theo-24 100 Mg Xr24h-Cap (Theophylline) .... One By Mouth Once Daily 4)  Proair Hfa 108 (90 Base) Mcg/act  Aers (Albuterol Sulfate) .... Two Puffs As Needed 5)  Nasonex 50 Mcg/act  Susp (Mometasone Furoate) .... 2 Sprays Once Daily 6)  Diovan 80 Mg  Tabs (Valsartan) .... One By Mouth Once Daily 7)  Amlodipine Besylate 5 Mg  Tabs (Amlodipine Besylate) .... Take One Tablet Daily. 8)  Synthroid 88 Mcg  Tabs (Levothyroxine Sodium) .... Take One Tablet Daily. 9)  Venlafaxine Hcl 75 Mg  Tabs (Venlafaxine Hcl) .... Take 1/ 2 Tablet Daily. 10)  Diazepam 5 Mg  Tabs (Diazepam) .... Take 1/2 By Mouth At Bedtime 11)  Percocet 5-325 Mg  Tabs (Oxycodone-Acetaminophen) .... Take One Tablet Every 6 Hours As Needed 12)  Flax Seed Oil 1000 Mg Caps (Flaxseed (Linseed)) .... Once Daily  Allergies (verified): 1)  ! Pcn  Past History:  Past Medical History: Hypertension COPD      - Spirometry 01/10/09 FEV1 0.97(52%), FVC 2.05(83%) FEV1% 47 Hypoxemia      - O2 86 % RA with exertion 01/10/09      - 2 liters oxygen with exertion and sleep Pulmonary nodule      - CT chest  Dec. 2010 Spinal Stenosis GERD Hypothyroidism Depression Insomnia Rhinitis ACE inhibitor cough  Vital Signs:  Patient profile:   75 year old female Height:      60 inches (152.40 cm) Weight:      160.50 pounds (72.95 kg) BMI:     31.46 O2 Sat:      91 % on Room air Temp:     97.5 degrees F (36.39 degrees C) oral Pulse rate:   80 / minute BP sitting:   118 / 70  (left arm) Cuff size:   regular  Vitals Entered By: Michel Bickers CMA (May 18, 2009 3:27 PM)  O2 Sat at Rest %:  91 O2 Flow:  Room air  Physical Exam  General:  normal appearance.   Nose:  clear nasal discharge.   Mouth:  no deformity or lesions Neck:  no JVD.   Lungs:  prolonged exhalation, decreased breath sounds, no wheezing or rales Heart:  regular rate and rhythm, S1, S2 without murmurs, rubs, gallops, or clicks Extremities:  no clubbing, cyanosis, edema, or deformity noted Cervical Nodes:  no significant adenopathy   Impression & Recommendations:  Problem # 1:  COPD (ICD-496) She is to continue her inhaler regimen and low dose theophylline.  Will check her theophylline level.  Problem # 2:  HYPOXEMIA (ICD-799.02) Will  continue on 2 liters with exertion and sleep.  Problem # 3:  PULMONARY NODULE (ICD-518.89)  She will need follow up CT chest in June 2011.  Problem # 4:  DRY MOUTH (ICD-527.7) She has been getting mouth dryness.  Will have her change spiriva to every other day, and see if this improves her symptoms.  Medications Added to Medication List This Visit: 1)  Spiriva Handihaler 18 Mcg Caps (Tiotropium bromide monohydrate) .... One puff every other day  Complete Medication List: 1)  Spiriva Handihaler 18 Mcg Caps (Tiotropium bromide monohydrate) .... One puff every other day 2)  Symbicort 160-4.5 Mcg/act Aero (Budesonide-formoterol fumarate) .... Two puffs two times a day 3)  Theo-24 100 Mg Xr24h-cap (Theophylline) .... One by mouth once daily 4)  Proair Hfa 108 (90 Base) Mcg/act Aers  (Albuterol sulfate) .... Two puffs as needed 5)  Nasonex 50 Mcg/act Susp (Mometasone furoate) .... 2 sprays once daily 6)  Diovan 80 Mg Tabs (Valsartan) .... One by mouth once daily 7)  Amlodipine Besylate 5 Mg Tabs (Amlodipine besylate) .... Take one tablet daily. 8)  Synthroid 88 Mcg Tabs (Levothyroxine sodium) .... Take one tablet daily. 9)  Venlafaxine Hcl 75 Mg Tabs (Venlafaxine hcl) .... Take 1/ 2 tablet daily. 10)  Diazepam 5 Mg Tabs (Diazepam) .... Take 1/2 by mouth at bedtime 11)  Percocet 5-325 Mg Tabs (Oxycodone-acetaminophen) .... Take one tablet every 6 hours as needed 12)  Flax Seed Oil 1000 Mg Caps (Flaxseed (linseed)) .... Once daily  Other Orders: Est. Patient Level III (09811) T-Theophylline Level ([address]1478-29562)  Patient Instructions: 1)  Try using spiriva one puff every other day  2)  Blood test today 3)  Follow up in June 2011 after CT chest

## 2010-03-05 NOTE — Progress Notes (Signed)
Summary: cough- wants rx called in asap  Phone Note Call from Priscilla Cantrell   Caller: Priscilla Cantrell Call For: SOOD Summary of Call: pt was seen 1/20. says her cough is worse now. she has been taking OTC delsym and it hasn't helped. says cough is non-productive. she "coughs and coughs"; almost wheezing, sort of like an asthma cough. pt denies fever. requests a rx be called in for this asap. cell [phone]. [facility] [place] Initial call taken by: Tivis Ringer, CNA,  March 15, 2009 4:53 PM  Follow-up for Phone Call        dr sood pls advise if you want to give meds for cough Follow-up by: Philipp Deputy CMA,  March 15, 2009 4:55 PM  Additional Follow-up for Phone Call Additional follow up Details #1::        Cough getting worse, has some wheeze, inhaler helped some.  Not bringing up sputum much, thick and clear to yellow.  Not coughing up blood.  She denies fever.  Will give taper course of prednisone.  Advised to call if her symptoms get worse. Additional Follow-up by: Coralyn Helling MD,  March 15, 2009 5:48 PM    New/Updated Medications: PREDNISONE 10 MG TABS (PREDNISONE) 3 pills for 2 days, 2 pills for 2 days, 1 pill for 2 days, 1/2 pill for 2 days Prescriptions: PREDNISONE 10 MG TABS (PREDNISONE) 3 pills for 2 days, 2 pills for 2 days, 1 pill for 2 days, 1/2 pill for 2 days  #13 x 0   Entered and Authorized by:   Coralyn Helling MD   Signed by:   Coralyn Helling MD on 03/15/2009   Method used:   [facility]        [facility]  [address] [phone][facility] (retail)       [address] [place], [place]  [address]       Ph: [phone] or [phone]       Fax: [phone]   RxID:   [id number]

## 2010-03-05 NOTE — Assessment & Plan Note (Signed)
Summary: 4-6 months/apc   Primary Provider/Referring Provider:  Corrin Parker  CC:  5 mo follow up.  states she still has hoarseness but it comes and goes.  c/o wheezing x1day.  Marland Kitchen  History of Present Illness: I saw Priscilla Cantrell in follow up for her COPD with asthma, rhinitis and ACE inhibitor cough.  She has been getting some wheeze in her throat and chest.  She is not coughing much.  She has to clear her throat in the morning.  She was having some sinus congestion and headache.  She has been using nasal irrigation and nasonex.  She feels her breathing is doing better with advair.  She has not been using proair much.  She does get winded easily with activity.  When this happens, if she uses her    Current Medications (verified): 1)  Spiriva Handihaler 18 Mcg  Caps (Tiotropium Bromide Monohydrate) .... One Puff Once Daily 2)  Advair Diskus 100-50 Mcg/dose  Misc (Fluticasone-Salmeterol) .... One  Inhalation  Two Times A Day 3)  Proair Hfa 108 (90 Base) Mcg/act  Aers (Albuterol Sulfate) .... Two Puffs As Needed 4)  Nasonex 50 Mcg/act  Susp (Mometasone Furoate) .... 2 Sprays Once Daily 5)  Diovan 80 Mg  Tabs (Valsartan) .... One By Mouth Once Daily 6)  Amlodipine Besylate 5 Mg  Tabs (Amlodipine Besylate) .... Take One Tablet Daily. 7)  Synthroid 88 Mcg  Tabs (Levothyroxine Sodium) .... Take One Tablet Daily. 8)  Venlafaxine Hcl 75 Mg  Tabs (Venlafaxine Hcl) .... Take 1/ 2 Tablet Daily. 9)  Diazepam 5 Mg  Tabs (Diazepam) .... Take 1/2 By Mouth At Bedtime 10)  Percocet 5-325 Mg  Tabs (Oxycodone-Acetaminophen) .... Take One Tablet Every 6 Hours As Needed 11)  Flax Seed Oil 1000 Mg Caps (Flaxseed (Linseed)) .... Once Daily  Allergies (verified): 1)  ! Pcn  Past History:  Social History: Last updated: 08/09/2007 Married.  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  Quit smoking in 1992, used to smoke 2 packs/day, and started at age 57.  Has second  hand tobacco exposure.    Past Medical History: Reviewed history from 11/08/2007 and no changes required. Hypertension COPD Spinal Stenosis GERD Hypothyroidism Depression Insomnia Rhinitis ACE inhibitor cough  Past Surgical History: Reviewed history from 08/09/2007 and no changes required. Gallbladder 2001 Tubal Ligation 1974 Benign right breast cyst Total Abdominal Hysterectomy  Family History: Reviewed history from 08/09/2007 and no changes required. Unremarkable  Social History: Reviewed history from 08/09/2007 and no changes required. Married.  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  Quit smoking in 1992, used to smoke 2 packs/day, and started at age 17.  Has second hand tobacco exposure.    Vital Signs:  Patient profile:   75 year old female Height:      60 inches Weight:      158.13 pounds BMI:     30.99 O2 Sat:      95 % on Room air Temp:     97.4 degrees F oral Pulse rate:   73 / minute BP sitting:   114 / 78  (left arm) Cuff size:   regular  Vitals Entered By: Gweneth Dimitri RN (November 07, 2008 3:18 PM)  O2 Flow:  Room air CC: 5 mo follow up.  states she still has hoarseness but it comes and goes.  c/o wheezing x1day.   Comments Medications reviewed with  patient Gweneth Dimitri RN  November 07, 2008 3:16 PM    Physical Exam  General:  normal appearance.   Ears:  TMs intact and clear with normal canals Nose:  clear nasal discharge.   Mouth:  no oral lesion Neck:  no LAN, no thyromegaly Lungs:  decreased breath sounds, no wheezing or rales Heart:  regular rhythm, normal rate, and no murmurs.   Abdomen:  thin, soft, nontender Extremities:  no edema, cyanosis, or clubbing Cervical Nodes:  no significant adenopathy   Impression & Recommendations:  Problem # 1:  COPD (ICD-496) I will stop her advair.  I will start symbicort.  She is to continue with spiriva, and as needed proair.  Problem # 2:   HOARSENESS (EAV-409.81) Will see if changing her inhalers improves her hoarseness.  If not, she may need to have further assessment of her sinus and possible reflux.  She may also need ENT evaluation.  I have advised her to try to use salt water gargles.  Problem # 3:  ALLERGIC RHINITIS (ICD-477.9) She is to continue nasal irrigation and nasonex.  Medications Added to Medication List This Visit: 1)  Symbicort 160-4.5 Mcg/act Aero (Budesonide-formoterol fumarate) .... Two puffs two times a day 2)  Flax Seed Oil 1000 Mg Caps (Flaxseed (linseed)) .... Once daily  Complete Medication List: 1)  Spiriva Handihaler 18 Mcg Caps (Tiotropium bromide monohydrate) .... One puff once daily 2)  Symbicort 160-4.5 Mcg/act Aero (Budesonide-formoterol fumarate) .... Two puffs two times a day 3)  Proair Hfa 108 (90 Base) Mcg/act Aers (Albuterol sulfate) .... Two puffs as needed 4)  Nasonex 50 Mcg/act Susp (Mometasone furoate) .... 2 sprays once daily 5)  Diovan 80 Mg Tabs (Valsartan) .... One by mouth once daily 6)  Amlodipine Besylate 5 Mg Tabs (Amlodipine besylate) .... Take one tablet daily. 7)  Synthroid 88 Mcg Tabs (Levothyroxine sodium) .... Take one tablet daily. 8)  Venlafaxine Hcl 75 Mg Tabs (Venlafaxine hcl) .... Take 1/ 2 tablet daily. 9)  Diazepam 5 Mg Tabs (Diazepam) .... Take 1/2 by mouth at bedtime 10)  Percocet 5-325 Mg Tabs (Oxycodone-acetaminophen) .... Take one tablet every 6 hours as needed 11)  Flax Seed Oil 1000 Mg Caps (Flaxseed (linseed)) .... Once daily  Other Orders: Est. Patient Level III (19147)  Patient Instructions: 1)  Stop advair 2)  Symbicort two puffs two times a day 3)  Follow up in 3 to 4 months Prescriptions: SYMBICORT 160-4.5 MCG/ACT AERO (BUDESONIDE-FORMOTEROL FUMARATE) two puffs two times a day  #1 x 4   Entered and Authorized by:   Coralyn Helling MD   Signed by:   Coralyn Helling MD on 11/07/2008   Method used:   [facility]        [facility]  [address] [phone][facility] (retail)        [address] [place], [place]  [address]       Ph: [phone] or [phone]       Fax: [phone]   RxID:   [id number]

## 2010-03-05 NOTE — Assessment & Plan Note (Signed)
Summary: follow up/ mbw   PCP:  Corrin Parker  Chief Complaint:  fu visit....Marland Kitchenrehab will be scheduled in 4 wks.......Marland Kitchenreviewed meds......  History of Present Illness: I saw Priscilla Cantrell in follow up for her COPD with asthma, rhinitis and ACE inhibitor cough.  She has been doing much better since her blood pressure medicine was changed.  She is no longer having a hacking cough.  In fact she says that she is doing quite well.  She continues to use advair and spiriva.  She has not felt the need to use proair for some time.  She uses nasal irrigation daily, and this helps.  She only needs to use nasonex as needed.  She got her flu shot from Dr. Jerelene Redden office.  ahcking cough improvesw with change in bp med     Past Medical History:    Hypertension    COPD    Spinal Stenosis    GERD    Hypothyroidism    Depression    Insomnia    Rhinitis    ACE inhibitor cough      Vital Signs:  Patient Profile:   74 Years Old Female Height:     60 inches Weight:      153 pounds BMI:     29.99 O2 Sat:      93 % O2 treatment:    Room Air Temp:     97.5 degrees F oral Pulse rate:   72 / minute BP sitting:   120 / 80  (left arm) Cuff size:   regular  Vitals Entered By: Clarise Cruz Duncan Dull) (November 08, 2007 3:54 PM)                 Physical Exam  General:     normal appearance and thin.   Nose:     clear nasal discharge.   Mouth:     no oral lesion Neck:     no LAN, no thyromegaly Lungs:     decreased breath sounds, no wheezing or rales Heart:     regular rhythm, normal rate, and no murmurs.   Abdomen:     thin, soft, nontender Extremities:     no edema, cyanosis, or clubbing      Impression & Recommendations:  Problem # 1:  COPD (ICD-496) She is to continue on her current inhaler regimen.  Problem # 2:  ALLERGIC RHINITIS (ICD-477.9) She is to continue on her current sinus regimen.    Problem # 3:  COUGH DUE TO ACE INHIBITORS (ICD-786.2) She  has been doing much better since changing to Diovan.  Problem # 4:  HOARSENESS (ICD-784.49) I have advised her to try salt water gargles.  Complete Medication List: 1)  Spiriva Handihaler 18 Mcg Caps (Tiotropium bromide monohydrate) .... One puff once daily 2)  Advair Diskus 100-50 Mcg/dose Misc (Fluticasone-salmeterol) .... One  inhalation  two times a day 3)  Proair Hfa 108 (90 Base) Mcg/act Aers (Albuterol sulfate) .... Two puffs as needed 4)  Nasonex 50 Mcg/act Susp (Mometasone furoate) .... 2 sprays once daily 5)  Diovan 80 Mg Tabs (Valsartan) .... One by mouth once daily 6)  Amlodipine Besylate 5 Mg Tabs (Amlodipine besylate) .... Take one tablet daily. 7)  Synthroid 88 Mcg Tabs (Levothyroxine sodium) .... Take one tablet daily. 8)  Venlafaxine Hcl 75 Mg Tabs (Venlafaxine hcl) .... Take 1/ 2 tablet daily. 9)  Diazepam 5 Mg Tabs (Diazepam) .... Take 1/2 by mouth at bedtime 10)  Percocet 5-325  Mg Tabs (Oxycodone-acetaminophen) .... Take one tablet every 6 hours as needed 11)  Zithromax Z-pak 250 Mg Tabs (Azithromycin) .... Use as directed   Patient Instructions: 1)  Follow up in 6 months   ]

## 2010-03-05 NOTE — Assessment & Plan Note (Signed)
Summary: BP RECK per VS/LC  Nurse Visit   Vital Signs:  Patient Profile:   75 Years Old Female CC:      nurse visit - B/P check Height:     60 inches BP sitting:   112 / 68  (left arm) Cuff size:   regular  Vitals Entered By: Marinus Maw (October 06, 2007 3:37 PM)                 Prior Medications: SPIRIVA HANDIHALER 18 MCG  CAPS (TIOTROPIUM BROMIDE MONOHYDRATE) one puff once daily ADVAIR DISKUS 100-50 MCG/DOSE  MISC (FLUTICASONE-SALMETEROL) One  inhalation  two times a day PROAIR HFA 108 (90 BASE) MCG/ACT  AERS (ALBUTEROL SULFATE) Two puffs as needed NASONEX 50 MCG/ACT  SUSP (MOMETASONE FUROATE) 2 sprays once daily DIOVAN 80 MG  TABS (VALSARTAN) one by mouth once daily AMLODIPINE BESYLATE 5 MG  TABS (AMLODIPINE BESYLATE) Take one tablet daily. SYNTHROID 88 MCG  TABS (LEVOTHYROXINE SODIUM) Take one tablet daily. VENLAFAXINE HCL 75 MG  TABS (VENLAFAXINE HCL) Take 1/ 2 tablet daily. DIAZEPAM 5 MG  TABS (DIAZEPAM) Take 1/2 by mouth at bedtime PERCOCET 5-325 MG  TABS (OXYCODONE-ACETAMINOPHEN) Take one tablet every 6 hours as needed ZITHROMAX Z-PAK 250 MG  TABS (AZITHROMYCIN) use as directed       ]

## 2010-03-05 NOTE — Assessment & Plan Note (Signed)
Summary: 3-4 weeks/apc   PCP:  Corrin Parker  Chief Complaint:  Follow-up PFT.Marland Kitchen  History of Present Illness: I saw Priscilla Cantrell in follow up for her COPD with asthma, rhinitis and ACE inhibitor use.  Her breathing is some better.  The spiriva has helped.  She uses her proair once or twice per week.  She continues to have a cough with throat irritation.  She is also now starting to get more green colored sputum.  She denies fever.  Her sinus symptoms have improved since starting nasonex and nasal irrigation.     Prior Medications Reviewed Using: Patient Recall  Prior Medication List:  SPIRIVA HANDIHALER 18 MCG  CAPS (TIOTROPIUM BROMIDE MONOHYDRATE) one puff once daily ADVAIR DISKUS 100-50 MCG/DOSE  MISC (FLUTICASONE-SALMETEROL) One  inhalation  two times a day PROAIR HFA 108 (90 BASE) MCG/ACT  AERS (ALBUTEROL SULFATE) Two puffs as needed NASONEX 50 MCG/ACT  SUSP (MOMETASONE FUROATE) 2 sprays once daily LISINOPRIL 20 MG  TABS (LISINOPRIL) Take one tablet daily. AMLODIPINE BESYLATE 5 MG  TABS (AMLODIPINE BESYLATE) Take one tablet daily. SYNTHROID 88 MCG  TABS (LEVOTHYROXINE SODIUM) Take one tablet daily. VENLAFAXINE HCL 75 MG  TABS (VENLAFAXINE HCL) Take 1/ 2 tablet daily. DIAZEPAM 5 MG  TABS (DIAZEPAM) Take 1/2 by mouth at bedtime PERCOCET 5-325 MG  TABS (OXYCODONE-ACETAMINOPHEN) Take one tablet every 6 hours as needed     Past Medical History:    Hypertension    COPD    Spinal Stenosis    GERD    Hypothyroidism    Depression    Insomnia    Rhinitis      Vital Signs:  Patient Profile:   75 Years Old Female Height:     60 inches Weight:      148.6 pounds O2 Sat:      92 % O2 treatment:    Room Air Temp:     97.7 degrees F oral Pulse rate:   69 / minute BP sitting:   108 / 70  (right arm) Cuff size:   regular  Vitals Entered By: Michel Bickers CMA (September 20, 2007 4:31 PM)                 Physical Exam  Nose:     clear nasal discharge.   Mouth:     no  oral lesion Neck:     no LAN, no thyromegaly Lungs:     decreased breath sounds, no wheezing or rales Heart:     regular rhythm, normal rate, and no murmurs.   Abdomen:     thin, soft, nontender Extremities:     no edema, cyanosis, or clubbing    Pulmonary Function Test Date: 09/01/2007 Height (in.): 60 Gender: Female  Pre-Spirometry FVC    Value: 3.05 L/min   Pred: 2.37 L/min     % Pred: 129 % FEV1    Value: 1.45 L     Pred: 1.66 L     % Pred: 88 % FEV1/FVC  Value: 48 %    FEF 25-75  Value: 0.42 L/min   Pred: 2.05 L/min     % Pred: 21 %  Post-Spirometry FVC    Value: 3.03 L/min   % Pred: 128 % FEV1    Value: 1.45 L     % Pred: 88 % FEV1/FVC  Value: 48 %    FEF 25-75  Value: 0.48 L/min   % Pred: 23 %  Lung Volumes TLC    Value: 4.37  L   % Pred: 109 % RV    Value: 1.33 L   % Pred: 82 % DLCO    Value: 7.6 %   % Pred: 43 % DLCO/VA  Value: 1.83 %   % Pred: 52 %  Comments: Mild to moderate obstruction.  Normal lung volumes.  No bronchodilator response.  Severe diffusion defect.    Impression & Recommendations:  Problem # 1:  COPD (ICD-496) She is to continue on spiriva, advair, and proair.  I am concerned that her ACE inhibitor may be contributing to some of her upper airway symptoms.  I will change her from lisinopril to Diovan 80 mg once daily.  She will follow up in 2 to 3 weeks for a blood pressure check.  I have given her a prescription for zithromax, and advised her to fill this if her cough gets worse.  She may need to be switched to an alternative inhaler besides advair if her throat symptoms persist. Her updated medication list for this problem includes:    Spiriva Handihaler 18 Mcg Caps (Tiotropium bromide monohydrate) ..... One puff once daily    Advair Diskus 100-50 Mcg/dose Misc (Fluticasone-salmeterol) ..... One  inhalation  two times a day    Proair Hfa 108 (90 Base) Mcg/act Aers (Albuterol sulfate) .Marland Kitchen..Marland Kitchen Two puffs as needed    Zithromax Z-pak 250 Mg Tabs  (Azithromycin) ..... Use as directed  Orders: Est. Patient Level III (04540) Pulmonary Referral (Pulmonary)   Problem # 2:  ALLERGIC RHINITIS (ICD-477.9) She is to continue nasonex and nasal irrigation.  If her symptoms persist she may need further allergy testing.   Her updated medication list for this problem includes:    Nasonex 50 Mcg/act Susp (Mometasone furoate) .Marland Kitchen... 2 sprays once daily  Orders: Est. Patient Level III (98119) Pulmonary Referral (Pulmonary)   Medications Added to Medication List This Visit: 1)  Diovan 80 Mg Tabs (Valsartan) .... One by mouth once daily 2)  Zithromax Z-pak 250 Mg Tabs (Azithromycin) .... Use as directed  Complete Medication List: 1)  Spiriva Handihaler 18 Mcg Caps (Tiotropium bromide monohydrate) .... One puff once daily 2)  Advair Diskus 100-50 Mcg/dose Misc (Fluticasone-salmeterol) .... One  inhalation  two times a day 3)  Proair Hfa 108 (90 Base) Mcg/act Aers (Albuterol sulfate) .... Two puffs as needed 4)  Nasonex 50 Mcg/act Susp (Mometasone furoate) .... 2 sprays once daily 5)  Diovan 80 Mg Tabs (Valsartan) .... One by mouth once daily 6)  Amlodipine Besylate 5 Mg Tabs (Amlodipine besylate) .... Take one tablet daily. 7)  Synthroid 88 Mcg Tabs (Levothyroxine sodium) .... Take one tablet daily. 8)  Venlafaxine Hcl 75 Mg Tabs (Venlafaxine hcl) .... Take 1/ 2 tablet daily. 9)  Diazepam 5 Mg Tabs (Diazepam) .... Take 1/2 by mouth at bedtime 10)  Percocet 5-325 Mg Tabs (Oxycodone-acetaminophen) .... Take one tablet every 6 hours as needed 11)  Zithromax Z-pak 250 Mg Tabs (Azithromycin) .... Use as directed  Pulmonary Function Test Date: 09/01/2007 Height (in.): 60 Gender: Female  Pre-Spirometry FVC    Value: 3.05 L/min   Pred: 2.37 L/min     % Pred: 129 % FEV1    Value: 1.45 L     Pred: 1.66 L     % Pred: 88 % FEV1/FVC  Value: 48 %    FEF 25-75  Value: 0.42 L/min   Pred: 2.05 L/min     % Pred: 21 %  Post-Spirometry FVC    Value:  3.03  L/min   % Pred: 128 % FEV1    Value: 1.45 L     % Pred: 88 % FEV1/FVC  Value: 48 %    FEF 25-75  Value: 0.48 L/min   % Pred: 23 %  Lung Volumes TLC    Value: 4.37 L   % Pred: 109 % RV    Value: 1.33 L   % Pred: 82 % DLCO    Value: 7.6 %   % Pred: 43 % DLCO/VA  Value: 1.83 %   % Pred: 52 %  Comments: Mild to moderate obstruction.  Normal lung volumes.  No bronchodilator response.  Severe diffusion defect.   Patient Instructions: 1)  Continue spiriva, advair, and proair. 2)  Continue nasal irrigation and nasonex 3)  Stop lisinopril 4)  Start Diovan 80 mg once daily  5)  Follow up in 2 to 3 weeks blood pressure check 6)  Follow up in 2 months 7)  Will arrange for pulmonary rehab 8)  Take zithromax if cough gets worse   Prescriptions: PROAIR HFA 108 (90 BASE) MCG/ACT  AERS (ALBUTEROL SULFATE) Two puffs as needed  #1 x 2   Entered and Authorized by:   Coralyn Helling MD   Signed by:   Coralyn Helling MD on 09/20/2007   Method used:   Print then Give to Patient   RxID:   [id number] DIOVAN 80 MG  TABS (VALSARTAN) one by mouth once daily  #30 x 2   Entered and Authorized by:   Coralyn Helling MD   Signed by:   Coralyn Helling MD on 09/20/2007   Method used:   Print then Give to Patient   RxID:   [id number] ZITHROMAX Z-PAK 250 MG  TABS (AZITHROMYCIN) use as directed  #1 x 0   Entered and Authorized by:   Coralyn Helling MD   Signed by:   Coralyn Helling MD on 09/20/2007   Method used:   Print then Give to Patient   RxID:   [id number]  ]

## 2010-03-05 NOTE — Progress Notes (Signed)
Summary: dry mouth only slightly improved  Phone Note Call from Patient Call back at [facility] Phone [phone] Call back at Work Phone [phone]   Caller: Patient Call For: sood Reason for Call: Talk to Nurse Summary of Call: pt has dry mouth, wakes up in night w/really dry mouth.  Also experiencing dryness during the day.  Can you get her on something to help this? Be at work # until 1:30pm then at home #. Initial call taken by: Eugene Gavia,  Jun 15, 2009 9:40 AM  Follow-up for Phone Call        Spoke with pt.  She states that VS switched her to spiriva every other day to see if dry mouth would improve.  She states that she has only noticed a very slight improvement.  She states that she is still rinsing mouth well after each use of both symbicort and spiriva.  Please advise, thanks!  Follow-up by: Vernie Murders,  Jun 15, 2009 9:54 AM  Additional Follow-up for Phone Call Additional follow up Details #1::        called pt.  her mouth dryness improved some, but her breathing got worse after switching to every other day spiriva.  advised her to continue with spiriva.  she will d/w primary care about whether there is an additional cause of her mouth dryness. Additional Follow-up by: Coralyn Helling MD,  Jun 15, 2009 10:10 AM

## 2010-03-05 NOTE — Progress Notes (Signed)
Summary: Appt needed with Dr. Craige Cotta  ---- Converted from flag ---- ---- 01/15/2009 5:31 PM, Coralyn Helling MD wrote: Can you schedule Ms. Traxler for an ROV in 4 to 6 weeks. ------------------------------  Phone Note Outgoing Call   Call placed by: Michel Bickers CMA,  January 16, 2009 10:22 AM Call placed to: Patient Summary of Call: Patient needs OV with Dr. Craige Cotta in 4-6 weeks (middle to end of January).   LMTCB. Initial call taken by: Michel Bickers CMA,  January 16, 2009 10:23 AM  Follow-up for Phone Call        Patient is scheduled for f/u with VS on 02/20/08 @ 4pm. Follow-up by: Michel Bickers CMA,  January 16, 2009 11:05 AM

## 2010-03-05 NOTE — Assessment & Plan Note (Signed)
Summary: rov/mbw   Primary Provider/Referring Provider:  Corrin Parker  CC:  COPD.  The patient c/o a dry hacky cough x2-3 weeks.Priscilla Cantrell  History of Present Illness: 75 year old female with hx GOLD 3/4 COPD, HTN, GERD, cough r/t ACE, hypoxemia on 2L oxygen with exertion and sleep, and pulmonary nodule.  She has been having a hacking cough for the past 3 weeks.  She was bringing up thick, yellow sputum.  She denies hemoptysis.  She has not had fever.  She is still getting drainage from her sinuses.  She is using her nasal rinse.  She has not had wheeze.  She has more energy since she started using oxygen, and theophylline.  She is using her proair about once or twice per week.  Her overnight oximetry from Jan. 6 on 2 liters showed good control of her oxygen level.  Current Medications (verified): 1)  Spiriva Handihaler 18 Mcg  Caps (Tiotropium Bromide Monohydrate) .... One Puff Once Daily 2)  Symbicort 160-4.5 Mcg/act Aero (Budesonide-Formoterol Fumarate) .... Two Puffs Two Times A Day 3)  Proair Hfa 108 (90 Base) Mcg/act  Aers (Albuterol Sulfate) .... Two Puffs As Needed 4)  Nasonex 50 Mcg/act  Susp (Mometasone Furoate) .... 2 Sprays Once Daily 5)  Diovan 80 Mg  Tabs (Valsartan) .... One By Mouth Once Daily 6)  Amlodipine Besylate 5 Mg  Tabs (Amlodipine Besylate) .... Take One Tablet Daily. 7)  Synthroid 88 Mcg  Tabs (Levothyroxine Sodium) .... Take One Tablet Daily. 8)  Venlafaxine Hcl 75 Mg  Tabs (Venlafaxine Hcl) .... Take 1/ 2 Tablet Daily. 9)  Diazepam 5 Mg  Tabs (Diazepam) .... Take 1/2 By Mouth At Bedtime 10)  Percocet 5-325 Mg  Tabs (Oxycodone-Acetaminophen) .... Take One Tablet Every 6 Hours As Needed 11)  Flax Seed Oil 1000 Mg Caps (Flaxseed (Linseed)) .... Once Daily 12)  Theo-24 100 Mg Xr24h-Cap (Theophylline) .... One By Mouth Once Daily  Allergies (verified): 1)  ! Pcn  Past History:  Past Medical History: Reviewed history from 01/10/2009 and no changes  required. Hypertension COPD      - Spirometry 01/10/09 FEV1 0.97(52%), FVC 2.05(83%) FEV1% 47 Hypoxemia      - O2 86 % RA with exertion 01/10/09      - 2 liters oxygen with exertion and sleep Spinal Stenosis GERD Hypothyroidism Depression Insomnia Rhinitis ACE inhibitor cough  Past Surgical History: Reviewed history from 08/09/2007 and no changes required. Gallbladder 2001 Tubal Ligation 1974 Benign right breast cyst Total Abdominal Hysterectomy  Vital Signs:  Patient profile:   75 year old female Height:      60 inches (152.40 cm) Weight:      156.50 pounds (71.14 kg) BMI:     30.67 O2 Sat:      90 % on Room air Temp:     97.7 degrees F (36.50 degrees C) oral Pulse rate:   71 / minute BP sitting:   110 / 60  (left arm) Cuff size:   regular  Vitals Entered By: Michel Bickers CMA (February 22, 2009 2:06 PM)  O2 Sat at Rest %:  90 O2 Flow:  Room air  Physical Exam  General:  normal appearance.   Nose:  clear nasal discharge.   Mouth:  no deformity or lesions Neck:  no masses, thyromegaly, or abnormal cervical nodes Lungs:  prolonged exhalation, decreased breath sounds, no wheezing or rales Heart:  regular rate and rhythm, S1, S2 without murmurs, rubs, gallops, or clicks Abdomen:  bowel  sounds positive; abdomen soft and non-tender without masses, or organomegaly Extremities:  no clubbing, cyanosis, edema, or deformity noted Cervical Nodes:  no significant adenopathy   Impression & Recommendations:  Problem # 1:  COPD (ICD-496) I don't think her current symptoms are related to her COPD.  She is to continue her inhaler regimen and low dose theophylline.  She seems to be tolerating theophylline well.  Will need to check her level at next visit.  Problem # 2:  HYPOXEMIA (ICD-799.02) Her overnight oximetry showed good control of her oxgen levels.  Will continue on 2 liters with exertion and sleep.  Problem # 3:  PULMONARY NODULE (ICD-518.89) She will need follow up CT  chest in June 2011.  Problem # 4:  HOARSENESS (ICD-784.49) She likely had a upper respiratory infection, and residual cough from this.  I don't think she needs antibiotics or prednisone at this time.  I have advised her to use delsym as needed for her cough.  She should also try salt water gargles, and sugarless candy to maintain salivary flow.  Problem # 5:  ALLERGIC RHINITIS (ICD-477.9) Some of her cough may be related to her sinuses.  I have encouraged her to continue with her sinus regimen.  Problem # 6:  GERD (ICD-530.81) Her reflux may be contributing to her throat irritation and cough.  I have asked her to use prilosec two times a day for the next several days to see if this improves her cough.  Complete Medication List: 1)  Spiriva Handihaler 18 Mcg Caps (Tiotropium bromide monohydrate) .... One puff once daily 2)  Symbicort 160-4.5 Mcg/act Aero (Budesonide-formoterol fumarate) .... Two puffs two times a day 3)  Theo-24 100 Mg Xr24h-cap (Theophylline) .... One by mouth once daily 4)  Proair Hfa 108 (90 Base) Mcg/act Aers (Albuterol sulfate) .... Two puffs as needed 5)  Nasonex 50 Mcg/act Susp (Mometasone furoate) .... 2 sprays once daily 6)  Diovan 80 Mg Tabs (Valsartan) .... One by mouth once daily 7)  Amlodipine Besylate 5 Mg Tabs (Amlodipine besylate) .... Take one tablet daily. 8)  Synthroid 88 Mcg Tabs (Levothyroxine sodium) .... Take one tablet daily. 9)  Venlafaxine Hcl 75 Mg Tabs (Venlafaxine hcl) .... Take 1/ 2 tablet daily. 10)  Diazepam 5 Mg Tabs (Diazepam) .... Take 1/2 by mouth at bedtime 11)  Percocet 5-325 Mg Tabs (Oxycodone-acetaminophen) .... Take one tablet every 6 hours as needed 12)  Flax Seed Oil 1000 Mg Caps (Flaxseed (linseed)) .... Once daily  Other Orders: Est. Patient Level III ([address]) Radiology Referral (Radiology)  Patient Instructions: 1)  Try delsym for cough 2)  Try using prilosec two times a day for next few days 3)  Follow up in 3 months

## 2010-03-05 NOTE — Progress Notes (Signed)
Summary: rx  Phone Note Call from Patient Call back at Work Phone [phone]   Caller: Patient Call For: sood Reason for Call: Talk to Nurse Summary of Call: Took her Spiriva wrong - started taking it every day when her husband had a heart attack.  She forgot she was only to take every other day.  Just got off track.  She has 1 left and really needs.  The pharmacy won't let her refill yet without your call and ok.  Can you take care of this? [facility]. Initial call taken by: Eugene Gavia,  August 24, 2009 10:06 AM  Follow-up for Phone Call        okay for early refill.  rx called into pharmacy with 6 refills.  called spoke with patient, advised her of this.  pt verbalized her understanding. Follow-up by: Boone Master CNA/MA,  August 24, 2009 10:31 AM    Prescriptions: SPIRIVA HANDIHALER 18 MCG  CAPS (TIOTROPIUM BROMIDE MONOHYDRATE) one puff every other day  #15 x 6   Entered by:   Boone Master CNA/MA   Authorized by:   Coralyn Helling MD   Signed by:   Boone Master CNA/MA on 08/24/2009   Method used:   Telephoned to ...       [facility]  [address] [phone][facility]ail[facility]       [address]       [place], [place]  [address]       Ph: [phone] or [phone]       Fax: [phone]   RxID:   [id number]

## 2010-03-05 NOTE — Assessment & Plan Note (Signed)
Summary: discuss CT chest results/LC   Visit Type:  Follow-up Primary Provider/Referring Provider:  Corrin Parker  CC:  Patient here for CT chest results. she c/o hoarseness.Marland Kitchen  History of Present Illness: 75 yo female with GOLD 3 COPD, ACE related cough, pulmonary nodule, and hypoxemia on 2 liters oxygen with exertion and sleep.  She feels her breathing is okay.  She has been getting some sinus congestion and post-nasal drip.  She feels a scratchy throat.  She has been getting run down since her husband is back in the hospital.  She has not been using her albuterol much.  She does not have much cough or wheeze.  She denies fever.   CT chest from July 19, 2009 showed stable pulmonary nodule.  CT of Chest  Procedure date:  07/19/2009  Findings:      CT CHEST WITHOUT CONTRAST   Technique:  Multidetector CT imaging of the chest was performed following the standard protocol without IV contrast.   Comparison: CT 01/12/2009   Findings: Previously noted 7 mm nodular density in the medial aspect of the left upper lobe is stable.  This may represent focal scarring as opposed to an actual nodule.  It is appreciated on image 31.  Mild peribronchial thickening in the lower lobes.  There is some scarring in the medial aspect of the right middle lobe and lingula.   There are no pathologically enlarged lymph nodes.  Bony thorax intact.   IMPRESSION: Stable small nodular like density in the medial aspect of the left upper lobe.     Current Medications (verified): 1)  Spiriva Handihaler 18 Mcg  Caps (Tiotropium Bromide Monohydrate) .... One Puff Every Other Day 2)  Symbicort 160-4.5 Mcg/act Aero (Budesonide-Formoterol Fumarate) .... Two Puffs Two Times A Day 3)  Theo-24 100 Mg Xr24h-Cap (Theophylline) .... One By Mouth Once Daily 4)  Proair Hfa 108 (90 Base) Mcg/act  Aers (Albuterol Sulfate) .... Two Puffs As Needed 5)  Nasonex 50 Mcg/act  Susp (Mometasone Furoate) .... 2 Sprays Once  Daily 6)  Diovan 80 Mg  Tabs (Valsartan) .... One By Mouth Once Daily 7)  Amlodipine Besylate 5 Mg  Tabs (Amlodipine Besylate) .... Take One Tablet Daily. 8)  Synthroid 88 Mcg  Tabs (Levothyroxine Sodium) .... Take One Tablet Daily. 9)  Venlafaxine Hcl 75 Mg  Tabs (Venlafaxine Hcl) .... Take 1/ 2 Tablet Daily. 10)  Diazepam 5 Mg  Tabs (Diazepam) .... Take 1/2 By Mouth At Bedtime 11)  Percocet 5-325 Mg  Tabs (Oxycodone-Acetaminophen) .... Take One Tablet Every 6 Hours As Needed 12)  Flax Seed Oil 1000 Mg Caps (Flaxseed (Linseed)) .... Once Daily  Allergies (verified): 1)  ! Pcn  Past History:  Past Medical History: Hypertension COPD      - Spirometry 01/10/09 FEV1 0.97(52%), FVC 2.05(83%) FEV1% 47 Hypoxemia      - O2 86 % RA with exertion 01/10/09      - 2 liters oxygen with exertion and sleep Pulmonary nodule      - CT chest Dec. 2010, June 2011 Spinal Stenosis GERD Hypothyroidism Depression Insomnia Rhinitis ACE inhibitor cough  Past Surgical History: Reviewed history from 08/09/2007 and no changes required. Gallbladder 2001 Tubal Ligation 1974 Benign right breast cyst Total Abdominal Hysterectomy  Vital Signs:  Patient profile:   75 year old female Height:      60 inches (152.40 cm) Weight:      154 pounds (70 kg) BMI:     30.18 O2 Sat:  93 % on Room air Temp:     97.7 degrees F (36.50 degrees C) oral Pulse rate:   78 / minute BP sitting:   108 / 70  (left arm) Cuff size:   regular  Vitals Entered By: Michel Bickers CMA (August 01, 2009 2:52 PM)  O2 Sat at Rest %:  93 O2 Flow:  Room air CC: Patient here for CT chest results. she c/o hoarseness. Comments Medications reviewed. Daytime phone verified. Michel Bickers CMA  August 01, 2009 2:53 PM   Physical Exam  General:  normal appearance.   Nose:  clear nasal discharge.   Mouth:  no deformity or lesions Neck:  no JVD.   Lungs:  prolonged exhalation, decreased breath sounds, no wheezing or rales Heart:   regular rate and rhythm, S1, S2 without murmurs, rubs, gallops, or clicks Extremities:  no clubbing, cyanosis, edema, or deformity noted Cervical Nodes:  no significant adenopathy   Impression & Recommendations:  Problem # 1:  COPD (ICD-496)  This is stable.  She is to continue on her current regimen.  Problem # 2:  ALLERGIC RHINITIS (ICD-477.9) She likely has a flare of her allergies.  I have advised her to continue her current regimen.  I don't think she needs antibiotics at this time.  I have advised her to also try salt water gargles for her throat, and to use sugarless candy to keep her mouth moist.  Problem # 3:  PULMONARY NODULE (ICD-518.89) This has been stable.  Will plan to repeat CT chest in one year (June 2011).  Problem # 4:  HYPOXEMIA (ICD-799.02) She is to continue oxygen at 2 liters with exertion and sleep.  Complete Medication List: 1)  Spiriva Handihaler 18 Mcg Caps (Tiotropium bromide monohydrate) .... One puff every other day 2)  Symbicort 160-4.5 Mcg/act Aero (Budesonide-formoterol fumarate) .... Two puffs two times a day 3)  Theo-24 100 Mg Xr24h-cap (Theophylline) .... One by mouth once daily 4)  Proair Hfa 108 (90 Base) Mcg/act Aers (Albuterol sulfate) .... Two puffs as needed 5)  Nasonex 50 Mcg/act Susp (Mometasone furoate) .... 2 sprays once daily 6)  Diovan 80 Mg Tabs (Valsartan) .... One by mouth once daily 7)  Amlodipine Besylate 5 Mg Tabs (Amlodipine besylate) .... Take one tablet daily. 8)  Synthroid 88 Mcg Tabs (Levothyroxine sodium) .... Take one tablet daily. 9)  Venlafaxine Hcl 75 Mg Tabs (Venlafaxine hcl) .... Take 1/ 2 tablet daily. 10)  Diazepam 5 Mg Tabs (Diazepam) .... Take 1/2 by mouth at bedtime 11)  Percocet 5-325 Mg Tabs (Oxycodone-acetaminophen) .... Take one tablet every 6 hours as needed 12)  Flax Seed Oil 1000 Mg Caps (Flaxseed (linseed)) .... Once daily  Other Orders: Est. Patient Level III ([address])  Patient Instructions: 1)  Try  salt water gargle for throat 2)  Use sugarless candy to keep mouth moist 3)  Follow up in 4 to 6 months

## 2010-03-07 ENCOUNTER — Encounter (INDEPENDENT_AMBULATORY_CARE_PROVIDER_SITE_OTHER): Payer: Medicare Other | Admitting: Family Medicine

## 2010-03-07 ENCOUNTER — Encounter: Payer: Self-pay | Admitting: Family Medicine

## 2010-03-07 ENCOUNTER — Other Ambulatory Visit: Payer: Self-pay | Admitting: Family Medicine

## 2010-03-07 ENCOUNTER — Ambulatory Visit: Admit: 2010-03-07 | Payer: Self-pay | Admitting: Family Medicine

## 2010-03-07 DIAGNOSIS — I10 Essential (primary) hypertension: Secondary | ICD-10-CM

## 2010-03-07 DIAGNOSIS — Z23 Encounter for immunization: Secondary | ICD-10-CM

## 2010-03-07 DIAGNOSIS — E039 Hypothyroidism, unspecified: Secondary | ICD-10-CM

## 2010-03-07 DIAGNOSIS — J449 Chronic obstructive pulmonary disease, unspecified: Secondary | ICD-10-CM

## 2010-03-07 DIAGNOSIS — M79609 Pain in unspecified limb: Secondary | ICD-10-CM

## 2010-03-07 NOTE — Assessment & Plan Note (Signed)
Summary: bronchitis/dt   Vital Signs:  Patient profile:   75 year old female Height:      60.75 inches (154.31 cm) Weight:      153.75 pounds (69.89 kg) O2 Sat:      92 % on Room air Temp:     97.8 degrees F (36.56 degrees C) oral Pulse rate:   80 / minute BP sitting:   116 / 71  (right arm) Cuff size:   regular  Vitals Entered By: Josph Macho RMA (January 14, 2010 1:31 PM)  O2 Flow:  Room air CC: Bronchitis- cough, tightness in chest, head congestion X3 day/ CF, URI symptoms Is Patient Diabetic? No   History of Present Illness:       This is a 75 year old woman who presents with 10+ days of URI symptoms, hoarseness, cough--all sx's worsening.  The patient complains of nasal congestion, nasal discharge/PND, and dry cough.  Associated symptoms include dyspnea and wheezing.  The patient denies fever.   +Nasal steroid and saline nasal spray+OTC cold med for people with HTN has been tried.  Has used ProAir as recently as last night. Room air O2 sat at home after exertion was 87%, and this came up quickly to 94% on 2 L O2 by [place].  Denies chest pain.  Denies LE edema, denies palpitations.  No n/v or arm pain, no abd pain.  Current Problems (verified): 1)  Essential Hypertension  (ICD-401.9) 2)  Plantar Fasciitis  (ICD-728.71) 3)  Spinal Stenosis  (ICD-724.00) 4)  Insomnia, Hx of  (ICD-V15.89) 5)  Hypothyroidism  (ICD-244.9) 6)  Hypoxemia  (ICD-799.02) 7)  Pulmonary Nodule  (ICD-518.89) 8)  Allergic Rhinitis  (ICD-477.9) 9)  COPD  (ICD-496)  Current Medications (verified): 1)  Spiriva Handihaler 18 Mcg  Caps (Tiotropium Bromide Monohydrate) .... One Puff Every Other Day 2)  Symbicort 160-4.5 Mcg/act Aero (Budesonide-Formoterol Fumarate) .... Two Puffs Two Times A Day 3)  Proair Hfa 108 (90 Base) Mcg/act  Aers (Albuterol Sulfate) .... Two Puffs As Needed 4)  Nasonex 50 Mcg/act  Susp (Mometasone Furoate) .... 2 Sprays Once Daily 5)  Amlodipine Besylate 5 Mg  Tabs (Amlodipine  Besylate) .... Take One Tablet Daily. 6)  Synthroid 88 Mcg  Tabs (Levothyroxine Sodium) .... Take One Tablet Daily. 7)  Diazepam 5 Mg  Tabs (Diazepam) .... Take 1/2 By Mouth At Bedtime 8)  Percocet 5-325 Mg  Tabs (Oxycodone-Acetaminophen) .... Take One Tablet Every 6 Hours As Needed 9)  Losartan Potassium 100 Mg Tabs (Losartan Potassium) .Marland Kitchen.. 1 Tab By Mouth Qd 10)  Venlafaxine Hcl 37.5 Mg Xr24h-Cap (Venlafaxine Hcl) .Marland Kitchen.. 1 Tab By Mouth Qd  Allergies (verified): 1)  ! Pcn 2)  ! Ace Inhibitors  Past History:  Past medical, surgical, family and social histories (including risk factors) reviewed, and no changes noted (except as noted below).  Past Medical History: Reviewed history from 08/01/2009 and no changes required. Hypertension COPD      - Spirometry 01/10/09 FEV1 0.97(52%), FVC 2.05(83%) FEV1% 47 Hypoxemia      - O2 86 % RA with exertion 01/10/09      - 2 liters oxygen with exertion and sleep Pulmonary nodule      - CT chest Dec. 2010, June 2011 Spinal Stenosis GERD Hypothyroidism Depression Insomnia Rhinitis ACE inhibitor cough  Past Surgical History: Reviewed history from 01/07/2010 and no changes required. Gallbladder 2001 Tubal Ligation 1974 Benign right breast cyst Total Abdominal Hysterectomy (no BSO per pt)--nonmalignant reasons  Family History:  Reviewed history from 08/09/2007 and no changes required. Unremarkable  Social History: Reviewed history from 08/09/2007 and no changes required. Married.  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  Quit smoking in 1992, used to smoke 2 packs/day, and started at age 37.  Has second hand tobacco exposure.    Review of Systems  The patient denies weight loss, vision loss, decreased hearing, headaches, melena, and hematochezia.         Also, see HPI  Physical Exam  General:  VS: noted, all normal. Gen: Alert, tired appearing, oriented x 4. HEENT: Scalp without lesions  or hair loss.  Ears: EACs clear, normal epithelium.  TMs with good light reflex and landmarks bilaterally.  Eyes: no injection, icteris, swelling, or exudate.  EOMI, PERRLA. Nose: no drainage or turbinate edema/swelling.  No inection or focal lesion.  Mouth: lips without lesion/swelling.  Oral mucosa pink and moist.  Dentition intact and without obvious caries or gingival swelling.  Oropharynx without erythema, exudate, or swelling.  Neck: supple.  No lymphadenopathy, thyromegaly, or mass. Chest: symmetric expansion, with nonlabored respirations.  BS clear with calm insp/exp, but with forced exhalation she has diffuse coarse expiratory wheeze with post-exhalation coughing.   CV: RRR, no m/r/g.  Peripheral pulses 2+/symmetric. ABD: soft, NT, ND.    Impression & Recommendations:  Problem # 1:  SINUSITIS, ACUTE (ICD-461.9) Assessment New Continue nasal saline and nasonex, restart claritin and add doxycycline x 10d.  Problem # 2:  OBSTRUCTIVE CHRONIC BRONCHITIS WITH EXACERBATION (ICD-491.21) Assessment: New  Will rx 5 days of prednisone 40mg  once daily. Continue current inhalers, including q4h as needed proair HFA.  Orders: Nebulizer Tx ([address]) Albuterol Sulfate Sol 1mg  unit dose (U0454) Nebulizer Tx (09811)  Complete Medication List: 1)  Spiriva Handihaler 18 Mcg Caps (Tiotropium bromide monohydrate) .... One puff every other day 2)  Symbicort 160-4.5 Mcg/act Aero (Budesonide-formoterol fumarate) .... Two puffs two times a day 3)  Proair Hfa 108 (90 Base) Mcg/act Aers (Albuterol sulfate) .... Two puffs as needed 4)  Nasonex 50 Mcg/act Susp (Mometasone furoate) .... 2 sprays once daily 5)  Amlodipine Besylate 5 Mg Tabs (Amlodipine besylate) .... Take one tablet daily. 6)  Synthroid 88 Mcg Tabs (Levothyroxine sodium) .... Take one tablet daily. 7)  Diazepam 5 Mg Tabs (Diazepam) .... Take 1/2 by mouth at bedtime 8)  Percocet 5-325 Mg Tabs (Oxycodone-acetaminophen) .... Take one tablet  every 6 hours as needed 9)  Losartan Potassium 100 Mg Tabs (Losartan potassium) .Marland Kitchen.. 1 tab by mouth qd 10)  Venlafaxine Hcl 37.5 Mg Xr24h-cap (Venlafaxine hcl) .Marland Kitchen.. 1 tab by mouth qd 11)  Prednisone 20 Mg Tabs (Prednisone) .... 2 tabs by mouth once daily x5d 12)  Doxycycline Hyclate 100 Mg Tabs (Doxycycline hyclate) .Marland Kitchen.. 1 tab by mouth two times a day x 10d  Patient Instructions: 1)  F/u in office if not significantly improved in 5-6 days, earlier if needed. Prescriptions: DOXYCYCLINE HYCLATE 100 MG TABS (DOXYCYCLINE HYCLATE) 1 tab by mouth two times a day x 10d  #20 x 0   Entered and Authorized by:   Michell Heinrich M.D.   Signed by:   Michell Heinrich M.D. on 01/14/2010   Method used:   [facility]        [facility]  [address] [phone][facility]       [address]       [place], [place]  [address]       Ph: [phone] or [phone]       Fax: [phone]   RxID:   [id number] PREDNISONE 20 MG TABS (PREDNISONE) 2 tabs by mouth once daily x5d  #10 x 0   Entered and Authorized by:   Michell Heinrich M.D.   Signed by:   Michell Heinrich M.D. on 01/14/2010   Method used:   [facility]        [facility]  [address] [phone][facility] ([facility]ail[facility]       [address] [place], [place]  [address]       Ph: [phone] or [phone]       Fax: [phone]   RxID:   [id number]    Medication Administration  Medication # 1:    Medication: Albuterol Sulfate Sol 1mg  unit dose    Diagnosis: OBSTRUCTIVE CHRONIC BRONCHITIS WITH EXACERBATION (ICD-491.21)    Dose: 2.5 mg     Route: po    Exp Date: 7/13    Lot #: [id number]    Mfr: Nephron    Patient tolerated medication without complications    Given by: Josph Macho RMA (January 14, 2010 2:41 PM)  Orders Added: 1)  Nebulizer Tx [94640] 2)  Est. Patient Level IV [70623] 3)  Albuterol Sulfate Sol 1mg  unit dose [J7613] 4)  Nebulizer Tx [76283]

## 2010-03-07 NOTE — Letter (Signed)
Summary: CMN for Oximetry/[facility]  CMN for Oximetry/[facility]   Imported By: Sherian Rein 01/18/2010 14:05:16  _____________________________________________________________________  External Attachment:    Type:   Image     Comment:   External Document

## 2010-03-07 NOTE — Miscellaneous (Signed)
Summary: Overnight oximetry on 2 liters  Clinical Lists Changes Mean SpO2 95%, low 87%.  Spent 24 sec (0.1%) with SpO2 < 88%.  Total recording time 8 hours.  Will have my nurse inform patient that oxygen level looked good with 2 liters oxygen.  No change to current set up.  Appended Document: Overnight oximetry on 2 liters pt aware and had no questions

## 2010-03-07 NOTE — Assessment & Plan Note (Signed)
Summary: Green mucus still in nose/dt   Vital Signs:  Patient profile:   75 year old female Height:      60.75 inches Weight:      157 pounds BMI:     30.02 O2 Sat:      92 % on Room air Temp:     97.6 degrees F oral Pulse rate:   68 / minute BP sitting:   135 / 80  (right arm) Cuff size:   regular  Vitals Entered By: Francee Piccolo CMA Duncan Dull) (February 01, 2010 2:35 PM)  O2 Flow:  Room air CC: Green mucus from nose, dry, hacking cough began today.  Pt did not finish doxycycline due to stomach upset, URI symptoms Is Patient Diabetic? No   History of Present Illness:       This is a 75 year old woman who presents with URI symptoms for the last 4d.  The patient complains of nasal congestion and clear nasal discharge.  Has had onset of scratchy throat with slight dry/hacky cough today.  The patient denies fever, dyspnea, and wheezing.  The patient denies headache, muscle aches, and severe fatigue.   She had a prolonged URI with acute COPD exacerbation 01/14/10 and took 5d of prednisone and 5d of doxy (doxy irritated stomach too much to finish) and felt REMARKABLY better.  Her fatigue and URI/LRI symptoms completely abated.  The continues to feel a good amount of energy.  Current Medications (verified): 1)  Spiriva Handihaler 18 Mcg  Caps (Tiotropium Bromide Monohydrate) .... One Puff Every Other Day 2)  Symbicort 160-4.5 Mcg/act Aero (Budesonide-Formoterol Fumarate) .... Two Puffs Two Times A Day 3)  Proair Hfa 108 (90 Base) Mcg/act  Aers (Albuterol Sulfate) .... Two Puffs As Needed 4)  Nasonex 50 Mcg/act  Susp (Mometasone Furoate) .... 2 Sprays Once Daily 5)  Amlodipine Besylate 5 Mg  Tabs (Amlodipine Besylate) .... Take One Tablet Daily. 6)  Synthroid 88 Mcg  Tabs (Levothyroxine Sodium) .... Take One Tablet Daily. 7)  Diazepam 5 Mg  Tabs (Diazepam) .... Take 1/2 By Mouth At Bedtime 8)  Percocet 5-325 Mg  Tabs (Oxycodone-Acetaminophen) .... Take One Tablet Every 6 Hours As  Needed 9)  Losartan Potassium 100 Mg Tabs (Losartan Potassium) .Marland Kitchen.. 1 Tab By Mouth Qd 10)  Venlafaxine Hcl 37.5 Mg Xr24h-Cap (Venlafaxine Hcl) .Marland Kitchen.. 1 Tab By Mouth Qd  Allergies (verified): 1)  ! Pcn 2)  ! Ace Inhibitors  Past History:  Past Medical History: Last updated: 08/01/2009 Hypertension COPD      - Spirometry 01/10/09 FEV1 0.97(52%), FVC 2.05(83%) FEV1% 47 Hypoxemia      - O2 86 % RA with exertion 01/10/09      - 2 liters oxygen with exertion and sleep Pulmonary nodule      - CT chest Dec. 2010, June 2011 Spinal Stenosis GERD Hypothyroidism Depression Insomnia Rhinitis ACE inhibitor cough  Past Surgical History: Last updated: 01/07/2010 Gallbladder 2001 Tubal Ligation 1974 Benign right breast cyst Total Abdominal Hysterectomy (no BSO per pt)--nonmalignant reasons  Social History: Last updated: 08/09/2007 Married.  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  Quit smoking in 1992, used to smoke 2 packs/day, and started at age 6.  Has second hand tobacco exposure.    Review of Systems       see HPI.  Physical Exam  General:  VS: noted, all normal. Gen: Alert, well appearing, oriented x 4. HEENT: Scalp without lesions or hair  loss.  Ears: EACs clear, normal epithelium.  TMs with good light reflex and landmarks bilaterally.  Eyes: no injection, icteris, swelling, or exudate.  EOMI, PERRLA. Nose: no drainage or turbinate edema/swelling.  No inection or focal lesion.  Mouth: lips without lesion/swelling.  Oral mucosa pink and moist.  Dentition intact and without obvious caries or gingival swelling.  Oropharynx without erythema, exudate, or swelling.  Neck: supple.  No lymphadenopathy, thyromegaly, or mass. Chest: symmetric expansion, with nonlabored respirations.  Clear and equal breath sounds in all lung fields.   CV: RRR, no m/r/g.  Peripheral pulses 2+/symmetric. EXT: no clubbing, cyanosis, or edema.      Impression & Recommendations:  Problem # 1:  VIRAL URI (ICD-465.9) Assessment New Continue frequent nasal saline irrigation, nasonex, and claritin. Call or return if not showing signs of improvement in 3d. No new meds rx'd today.  Problem # 2:  COPD (ICD-496) Assessment: Unchanged Stable. Her updated medication list for this problem includes:    Spiriva Handihaler 18 Mcg Caps (Tiotropium bromide monohydrate) ..... One puff every other day    Symbicort 160-4.5 Mcg/act Aero (Budesonide-formoterol fumarate) .Marland Kitchen..Marland Kitchen Two puffs two times a day    Proair Hfa 108 (90 Base) Mcg/act Aers (Albuterol sulfate) .Marland Kitchen..Marland Kitchen Two puffs as needed  Complete Medication List: 1)  Spiriva Handihaler 18 Mcg Caps (Tiotropium bromide monohydrate) .... One puff every other day 2)  Symbicort 160-4.5 Mcg/act Aero (Budesonide-formoterol fumarate) .... Two puffs two times a day 3)  Proair Hfa 108 (90 Base) Mcg/act Aers (Albuterol sulfate) .... Two puffs as needed 4)  Nasonex 50 Mcg/act Susp (Mometasone furoate) .... 2 sprays once daily 5)  Amlodipine Besylate 5 Mg Tabs (Amlodipine besylate) .... Take one tablet daily. 6)  Synthroid 88 Mcg Tabs (Levothyroxine sodium) .... Take one tablet daily. 7)  Diazepam 5 Mg Tabs (Diazepam) .... Take 1/2 by mouth at bedtime 8)  Percocet 5-325 Mg Tabs (Oxycodone-acetaminophen) .... Take one tablet every 6 hours as needed 9)  Losartan Potassium 100 Mg Tabs (Losartan potassium) .Marland Kitchen.. 1 tab by mouth qd 10)  Venlafaxine Hcl 37.5 Mg Xr24h-cap (Venlafaxine hcl) .Marland Kitchen.. 1 tab by mouth qd  Patient Instructions: 1)  Please schedule a follow-up appointment as needed .    Orders Added: 1)  Est. Patient Level III [16109]

## 2010-03-08 ENCOUNTER — Other Ambulatory Visit: Payer: Medicare Other

## 2010-03-08 ENCOUNTER — Encounter (INDEPENDENT_AMBULATORY_CARE_PROVIDER_SITE_OTHER): Payer: Self-pay | Admitting: *Deleted

## 2010-03-08 LAB — HEPATIC FUNCTION PANEL
ALT: 18 U/L (ref 0–35)
AST: 20 U/L (ref 0–37)
Albumin: 3.8 g/dL (ref 3.5–5.2)
Alkaline Phosphatase: 86 U/L (ref 39–117)
Bilirubin, Direct: 0.1 mg/dL (ref 0.0–0.3)
Total Bilirubin: 0.4 mg/dL (ref 0.3–1.2)
Total Protein: 6.4 g/dL (ref 6.0–8.3)

## 2010-03-08 LAB — CBC WITH DIFFERENTIAL/PLATELET
Basophils Absolute: 0 10*3/uL (ref 0.0–0.1)
Basophils Relative: 0.5 % (ref 0.0–3.0)
Eosinophils Absolute: 0.3 10*3/uL (ref 0.0–0.7)
Eosinophils Relative: 3.6 % (ref 0.0–5.0)
HCT: 37 % (ref 36.0–46.0)
Hemoglobin: 12.3 g/dL (ref 12.0–15.0)
Lymphocytes Relative: 29 % (ref 12.0–46.0)
Lymphs Abs: 2 10*3/uL (ref 0.7–4.0)
MCHC: 33.2 g/dL (ref 30.0–36.0)
MCV: 80 fl (ref 78.0–100.0)
Monocytes Absolute: 0.7 10*3/uL (ref 0.1–1.0)
Monocytes Relative: 9.6 % (ref 3.0–12.0)
Neutro Abs: 3.9 10*3/uL (ref 1.4–7.7)
Neutrophils Relative %: 57.3 % (ref 43.0–77.0)
Platelets: 298 10*3/uL (ref 150.0–400.0)
RBC: 4.63 Mil/uL (ref 3.87–5.11)
RDW: 17.4 % — ABNORMAL HIGH (ref 11.5–14.6)
WBC: 6.9 10*3/uL (ref 4.5–10.5)

## 2010-03-08 LAB — BASIC METABOLIC PANEL
BUN: 12 mg/dL (ref 6–23)
CO2: 28 mEq/L (ref 19–32)
Calcium: 9 mg/dL (ref 8.4–10.5)
Chloride: 107 mEq/L (ref 96–112)
Creatinine, Ser: 0.9 mg/dL (ref 0.4–1.2)
GFR: 62.07 mL/min (ref >60.00)
Glucose, Bld: 81 mg/dL (ref 70–99)
Potassium: 4.2 mEq/L (ref 3.5–5.1)
Sodium: 141 mEq/L (ref 135–145)

## 2010-03-08 LAB — LIPID PANEL
Cholesterol: 202 mg/dL — ABNORMAL HIGH (ref 0–200)
HDL: 61.2 mg/dL (ref >39.00)
Total CHOL/HDL Ratio: 3
Triglycerides: 96 mg/dL (ref 0.0–149.0)
VLDL: 19.2 mg/dL (ref 0.0–40.0)

## 2010-03-08 LAB — LDL CHOLESTEROL, DIRECT: Direct LDL: 131.3 mg/dL

## 2010-03-08 LAB — TSH: TSH: 4.44 u[IU]/mL (ref 0.35–5.50)

## 2010-03-11 ENCOUNTER — Telehealth (INDEPENDENT_AMBULATORY_CARE_PROVIDER_SITE_OTHER): Payer: Self-pay | Admitting: *Deleted

## 2010-03-12 ENCOUNTER — Encounter: Payer: Self-pay | Admitting: Family Medicine

## 2010-03-13 NOTE — Assessment & Plan Note (Signed)
Summary: cpx/ dt   Vital Signs:  Patient profile:   75 year old female Height:      60.75 inches Weight:      157.75 pounds BMI:     30.16 Pulse rate:   60 / minute Pulse rhythm:   regular BP sitting:   120 / 72  (right arm) Cuff size:   regular  Vitals Entered By: Francee Piccolo CMA Duncan Dull) (March 07, 2010 2:45 PM) CC: CPX, no problems//SP Is Patient Diabetic? No   History of Present Illness: 75 y/o WF here for routine annual health maintenance exam. Feeling like lungs are at baseline.  Has minimal nasal congestion/rhinorrhea/PND that she has most of the time--no recent change.  Uses scheduled inhalers with great compliance and usually goes 2-3 weeks at a time between albuterol rescue use.      Does complain of longstanding pain in both legs with walking--starts at  buttock/thigh level and extends down the back of both legs to her ankles, associated with very tired feeling in legs like they may give out, symptoms go away with a short period of rest.  She is limited by this just as much as she is limited by SOB with walking. Denies CP, palpitations, or PND.   She is UTD on flu vaccine and pneumovax per her report, but she thinks it has been well over 5 years since last tetanus booster and she has never had Tdap. She gets mammograms yearly through her breast surgeon (Dr. Jamey Ripa, hx of benign breast disease), and will actually be getting her OB/GYN to take over this and her breast exams.  She gets annual pelvic exam at GYN.  She is almost due for colonoscopy as per her GI MD's recommendation to her--q68yrs (Dr. Kinnie Scales).  She relates a history of diverticular dz but no polyps/mass known to pt.  No GI records available at this time.  We still have not received records from Dr. Dagoberto Ligas.  He is apparently retiring and we'll continue our attempts to get her records from his office.  Preventive Screening-Counseling & Management  Alcohol-Tobacco     Alcohol drinks/day: 0     Smoking  Status: quit > 6 months  Current Medications (verified): 1)  Spiriva Handihaler 18 Mcg  Caps (Tiotropium Bromide Monohydrate) .... One Puff Every Other Day 2)  Symbicort 160-4.5 Mcg/act Aero (Budesonide-Formoterol Fumarate) .... Two Puffs Two Times A Day 3)  Proair Hfa 108 (90 Base) Mcg/act  Aers (Albuterol Sulfate) .... Two Puffs As Needed 4)  Nasonex 50 Mcg/act  Susp (Mometasone Furoate) .... 2 Sprays Once Daily 5)  Amlodipine Besylate 5 Mg  Tabs (Amlodipine Besylate) .... Take One Tablet Daily. 6)  Synthroid 88 Mcg  Tabs (Levothyroxine Sodium) .... Take One Tablet Daily. 7)  Diazepam 5 Mg  Tabs (Diazepam) .... Take 1/2 By Mouth At Bedtime 8)  Percocet 5-325 Mg  Tabs (Oxycodone-Acetaminophen) .... Take One Tablet Every 6 Hours As Needed 9)  Losartan Potassium 100 Mg Tabs (Losartan Potassium) .Marland Kitchen.. 1 Tab By Mouth Qd 10)  Venlafaxine Hcl 37.5 Mg Xr24h-Cap (Venlafaxine Hcl) .Marland Kitchen.. 1 Tab By Mouth Qd  Allergies (verified): 1)  ! Pcn 2)  ! Ace Inhibitors  Past History:  Past Medical History: Hypertension COPD      - Spirometry 01/10/09 FEV1 0.97(52%), FVC 2.05(83%) FEV1% 47 Hypoxemia      - O2 86 % RA with exertion 01/10/09      - 2 liters oxygen with exertion and sleep Pulmonary nodule      -  CT chest Dec. 2010, June 2011 Spinal Stenosis GERD Hypothyroidism Depression Insomnia Rhinitis ACE inhibitor cough Diverticulosis/diverticulitis (Dr. Kinnie Scales)  Past Surgical History: Reviewed history from 01/07/2010 and no changes required. Gallbladder 2001 Tubal Ligation 1974 Benign right breast cyst Total Abdominal Hysterectomy (no BSO per pt)--nonmalignant reasons  Family History: Reviewed history from 08/09/2007 and no changes required. Unremarkable  Social History: Reviewed history from 08/09/2007 and no changes required. Married.  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  Quit smoking in 1992, used to smoke 2 packs/day, and started at  age 51.  Has second hand tobacco exposure.    Review of Systems General:  Denies fatigue, fever, and weight loss. Eyes:  Denies discharge, double vision, eye irritation, eye pain, and red eye. ENT:  Denies decreased hearing, difficulty swallowing, earache, and nosebleeds. CV:  Complains of shortness of breath with exertion and swelling of feet; denies chest pain or discomfort, fainting, and lightheadness. Resp:  Denies chest discomfort and coughing up blood. GI:  Denies abdominal pain, bloody stools, and loss of appetite. GU:  Denies abnormal vaginal bleeding and discharge. MS:  Complains of low back pain; denies joint redness, joint swelling, and muscle weakness.  Physical Exam  General:  VS: noted, all normal. Gen: Alert, well appearing, oriented x 4. HEENT: Scalp without lesions or hair loss.  Ears: EACs clear, normal epithelium.  TMs with good light reflex and landmarks bilaterally.  Eyes: no injection, icteris, swelling, or exudate.  EOMI, PERRLA. Nose: no drainage or turbinate edema/swelling.  No injection or focal lesion.  Mouth: lips without lesion/swelling.  Oral mucosa pink and moist.  Dentition intact and without obvious caries or gingival swelling.  Oropharynx without erythema, exudate, or swelling.  Neck: supple.  No lymphadenopathy, thyromegaly, or mass. Chest: symmetric expansion, with nonlabored respirations.  Clear and equal breath sounds in all lung fields.   CV: RRR, no m/r/g.  Peripheral pulses 2+/symmetric. ABD: soft, NT, ND, BS normal.  No hepatospenomegaly or mass.  No bruits. EXT: no clubbing or cyanosis.  1+ pitting edema from mid tibial level into ankles bilaterally.  Femoral pulses diminished (trace) bilaterally, without bruit.  Popliteal pulses, DP, and PT pulses all 2+/symmetric. SKIN: many scattered seborrheic keratoses, many tiny cherry angiomata.  Varicosities in both LE's.   Impression & Recommendations:  Problem # 1:  HEALTH MAINTENANCE EXAM  (ICD-V70.0) Assessment New Reviewed prudent diet, exercise, gave Tdap today. EKG today normal. Will check cbc, bmet, hepatic panel, TSH, lipid panel. She'll continue routine follow up with her GYN and her GI doc.  Orders: Venipuncture ([id number])  Problem # 2:  LEG PAIN, BILATERAL (ICD-729.5) Assessment: New Suspicious for neurogenic claudication vs vascular claudication.  Has known history of spinal stenosis. Will check ABIs and LE art dopplers to start.  Orders: LE Arterial Doppler/ABI (Le arterial doppler)  Problem # 3:  ESSENTIAL HYPERTENSION (ICD-401.9) Assessment: Unchanged  Her updated medication list for this problem includes:    Amlodipine Besylate 5 Mg Tabs (Amlodipine besylate) .Marland Kitchen... Take one tablet daily.    Losartan Potassium 100 Mg Tabs (Losartan potassium) .Marland Kitchen... 1 tab by mouth qd  Orders: Venipuncture ([id number]) TLB-Lipid Panel (80061-LIPID) TLB-BMP (Basic Metabolic Panel-BMET) (80048-METABOL) TLB-CBC Platelet - w/Differential (85025-CBCD) TLB-Hepatic/Liver Function Pnl (80076-HEPATIC) TLB-TSH (Thyroid Stimulating Hormone) (84443-TSH) EKG w/ Interpretation (93000) LE Arterial Doppler/ABI (Le arterial doppler)  Problem # 4:  HYPOTHYROIDISM (ICD-244.9) Assessment: Unchanged  Her updated medication list for this problem includes:    Synthroid 88 Mcg  Tabs (Levothyroxine sodium) .Marland Kitchen... Take one tablet daily.  Orders: Venipuncture (16109) TLB-Lipid Panel (80061-LIPID) TLB-BMP (Basic Metabolic Panel-BMET) (80048-METABOL) TLB-CBC Platelet - w/Differential (85025-CBCD) TLB-Hepatic/Liver Function Pnl (80076-HEPATIC) TLB-TSH (Thyroid Stimulating Hormone) (84443-TSH)  Problem # 5:  COPD (ICD-496) Continue nocturnal O2 and meds listed below. Continue routine pulmonology f/u.  Her updated medication list for this problem includes:    Spiriva Handihaler 18 Mcg Caps (Tiotropium bromide monohydrate) ..... One puff every other day    Symbicort 160-4.5 Mcg/act Aero  (Budesonide-formoterol fumarate) .Marland Kitchen..Marland Kitchen Two puffs two times a day    Proair Hfa 108 (90 Base) Mcg/act Aers (Albuterol sulfate) .Marland Kitchen..Marland Kitchen Two puffs as needed  Orders: Venipuncture ([id number]) TLB-Lipid Panel (80061-LIPID) TLB-BMP (Basic Metabolic Panel-BMET) (80048-METABOL) TLB-CBC Platelet - w/Differential (85025-CBCD) TLB-Hepatic/Liver Function Pnl (80076-HEPATIC) TLB-TSH (Thyroid Stimulating Hormone) (84443-TSH) EKG w/ Interpretation (93000)  Complete Medication List: 1)  Spiriva Handihaler 18 Mcg Caps (Tiotropium bromide monohydrate) .... One puff every other day 2)  Symbicort 160-4.5 Mcg/act Aero (Budesonide-formoterol fumarate) .... Two puffs two times a day 3)  Proair Hfa 108 (90 Base) Mcg/act Aers (Albuterol sulfate) .... Two puffs as needed 4)  Nasonex 50 Mcg/act Susp (Mometasone furoate) .... 2 sprays once daily 5)  Amlodipine Besylate 5 Mg Tabs (Amlodipine besylate) .... Take one tablet daily. 6)  Synthroid 88 Mcg Tabs (Levothyroxine sodium) .... Take one tablet daily. 7)  Diazepam 5 Mg Tabs (Diazepam) .... Take 1/2 by mouth at bedtime 8)  Percocet 5-325 Mg Tabs (Oxycodone-acetaminophen) .... Take one tablet every 6 hours as needed 9)  Losartan Potassium 100 Mg Tabs (Losartan potassium) .Marland Kitchen.. 1 tab by mouth qd 10)  Venlafaxine Hcl 37.5 Mg Xr24h-cap (Venlafaxine hcl) .Marland Kitchen.. 1 tab by mouth qd  Patient Instructions: 1)  Please obtain records from Dr. Kinnie Scales (GI). 2)  Take your lab orders to [facility] on [address] (must be fasting). 3)  F/u appt 6 months, earlier if problems arise.   Orders Added: 1)  Venipuncture [36415] 2)  TLB-Lipid Panel [80061-LIPID] 3)  TLB-BMP (Basic Metabolic Panel-BMET) [80048-METABOL] 4)  TLB-CBC Platelet - w/Differential [85025-CBCD] 5)  TLB-Hepatic/Liver Function Pnl [80076-HEPATIC] 6)  TLB-TSH (Thyroid Stimulating Hormone) [84443-TSH] 7)  EKG w/ Interpretation [93000] 8)  LE Arterial Doppler/ABI [Le arterial doppler] 9)  New Patient 65&> [09811] 10)   Est. Patient Level III [91478]  Appended Document: cpx/ dt    Clinical Lists Changes  Orders: Added new Service order of Tdap => 45yrs IM (29562) - Signed Added new Service order of Admin 1st Vaccine (13086) - Signed Observations: Added new observation of TD BOOST VIS: 12/22/07 version given March 07, 2010. (03/07/2010 16:48) Added new observation of TD BOOSTERLO: [id number] (03/07/2010 16:48) Added new observation of TD BOOST EXP: 11/23/2011 (03/07/2010 16:48) Added new observation of TD BOOSTERBY: Francee Piccolo CMA (AAMA) (03/07/2010 16:48) Added new observation of TD BOOSTERRT: IM (03/07/2010 16:48) Added new observation of TDBOOSTERDSE: 0.5 ml (03/07/2010 16:48) Added new observation of TD BOOSTERMF: GlaxoSmith[employer] (03/07/2010 16:48) Added new observation of TD BOOST SIT: right deltoid (03/07/2010 16:48) Added new observation of TD BOOSTER: Tdap (03/07/2010 16:48)       Immunizations Administered:  Tetanus Vaccine:    Vaccine Type: Tdap    Site: right deltoid    Mfr: GlaxoSmithKline    Dose: 0.5 ml    Route: IM    Given by: Francee Piccolo CMA (AAMA)    Exp. Date: 11/23/2011    Lot #: [id number]    VIS given: 12/22/07 version given March 07, 2010.

## 2010-03-17 ENCOUNTER — Emergency Department (HOSPITAL_BASED_OUTPATIENT_CLINIC_OR_DEPARTMENT_OTHER)
Admission: EM | Admit: 2010-03-17 | Discharge: 2010-03-17 | Disposition: A | Discharge status: Home / Self Care | Payer: Medicare Other | Attending: Emergency Medicine | Admitting: Emergency Medicine

## 2010-03-17 ENCOUNTER — Emergency Department (INDEPENDENT_AMBULATORY_CARE_PROVIDER_SITE_OTHER): Payer: Medicare Other

## 2010-03-17 DIAGNOSIS — I10 Essential (primary) hypertension: Secondary | ICD-10-CM | POA: Insufficient documentation

## 2010-03-17 DIAGNOSIS — R05 Cough: Secondary | ICD-10-CM | POA: Insufficient documentation

## 2010-03-17 DIAGNOSIS — Z8739 Personal history of other diseases of the musculoskeletal system and connective tissue: Secondary | ICD-10-CM | POA: Insufficient documentation

## 2010-03-17 DIAGNOSIS — N390 Urinary tract infection, site not specified: Secondary | ICD-10-CM | POA: Insufficient documentation

## 2010-03-17 DIAGNOSIS — R509 Fever, unspecified: Secondary | ICD-10-CM

## 2010-03-17 DIAGNOSIS — J449 Chronic obstructive pulmonary disease, unspecified: Secondary | ICD-10-CM | POA: Insufficient documentation

## 2010-03-17 DIAGNOSIS — R059 Cough, unspecified: Secondary | ICD-10-CM

## 2010-03-17 DIAGNOSIS — Z79899 Other long term (current) drug therapy: Secondary | ICD-10-CM | POA: Insufficient documentation

## 2010-03-17 DIAGNOSIS — J4489 Other specified chronic obstructive pulmonary disease: Secondary | ICD-10-CM | POA: Insufficient documentation

## 2010-03-17 LAB — DIFFERENTIAL
Basophils Absolute: 0 10*3/uL (ref 0.0–0.1)
Basophils Relative: 0 % (ref 0–1)
Eosinophils Absolute: 0.1 10*3/uL (ref 0.0–0.7)
Eosinophils Relative: 1 % (ref 0–5)
Lymphocytes Relative: 13 % (ref 12–46)
Lymphs Abs: 1.9 10*3/uL (ref 0.7–4.0)
Monocytes Absolute: 1.2 10*3/uL — ABNORMAL HIGH (ref 0.1–1.0)
Monocytes Relative: 8 % (ref 3–12)
Neutro Abs: 11.5 10*3/uL — ABNORMAL HIGH (ref 1.7–7.7)
Neutrophils Relative %: 79 % — ABNORMAL HIGH (ref 43–77)

## 2010-03-17 LAB — CBC
HCT: 36.6 % (ref 36.0–46.0)
Hemoglobin: 12 g/dL (ref 12.0–15.0)
MCH: 25.6 pg — ABNORMAL LOW (ref 26.0–34.0)
MCHC: 32.8 g/dL (ref 30.0–36.0)
MCV: 78 fL (ref 78.0–100.0)
Platelets: 298 10*3/uL (ref 150–400)
RBC: 4.69 MIL/uL (ref 3.87–5.11)
RDW: 16.8 % — ABNORMAL HIGH (ref 11.5–15.5)
WBC: 14.7 10*3/uL — ABNORMAL HIGH (ref 4.0–10.5)

## 2010-03-17 LAB — URINE MICROSCOPIC-ADD ON

## 2010-03-17 LAB — URINALYSIS, ROUTINE W REFLEX MICROSCOPIC
Bilirubin Urine: NEGATIVE
Hgb urine dipstick: NEGATIVE
Ketones, ur: NEGATIVE mg/dL
Nitrite: NEGATIVE
Protein, ur: NEGATIVE mg/dL
Specific Gravity, Urine: 1.019 (ref 1.005–1.030)
Urine Glucose, Fasting: NEGATIVE mg/dL
Urobilinogen, UA: 1 mg/dL (ref 0.0–1.0)
pH: 6 (ref 5.0–8.0)

## 2010-03-17 LAB — BASIC METABOLIC PANEL
BUN: 10 mg/dL (ref 6–23)
CO2: 22 mEq/L (ref 19–32)
Calcium: 9.2 mg/dL (ref 8.4–10.5)
Chloride: 107 mEq/L (ref 96–112)
Creatinine, Ser: 0.8 mg/dL (ref 0.4–1.2)
GFR calc Af Amer: >60 mL/min (ref >60)
GFR calc non Af Amer: >60 mL/min (ref >60)
Glucose, Bld: 110 mg/dL — ABNORMAL HIGH (ref 70–99)
Potassium: 4 mEq/L (ref 3.5–5.1)
Sodium: 141 mEq/L (ref 135–145)

## 2010-03-18 ENCOUNTER — Ambulatory Visit (INDEPENDENT_AMBULATORY_CARE_PROVIDER_SITE_OTHER): Payer: Medicare Other | Admitting: Pulmonary Disease

## 2010-03-18 ENCOUNTER — Encounter: Payer: Self-pay | Admitting: Pulmonary Disease

## 2010-03-18 DIAGNOSIS — J441 Chronic obstructive pulmonary disease with (acute) exacerbation: Secondary | ICD-10-CM

## 2010-03-18 DIAGNOSIS — J309 Allergic rhinitis, unspecified: Secondary | ICD-10-CM

## 2010-03-18 DIAGNOSIS — J4489 Other specified chronic obstructive pulmonary disease: Secondary | ICD-10-CM

## 2010-03-18 DIAGNOSIS — R0902 Hypoxemia: Secondary | ICD-10-CM

## 2010-03-18 DIAGNOSIS — J449 Chronic obstructive pulmonary disease, unspecified: Secondary | ICD-10-CM

## 2010-03-19 LAB — URINE CULTURE
Colony Count: 50000
Culture  Setup Time: 201202122012

## 2010-03-21 NOTE — Miscellaneous (Signed)
  Clinical Lists Changes  Observations: Added new observation of PAST MED HX: Hypertension COPD      - Spirometry 01/10/09 FEV1 0.97(52%), FVC 2.05(83%) FEV1% 47 Hypoxemia      - O2 86 % RA with exertion 01/10/09      - 2 liters oxygen with exertion and sleep Pulmonary nodule      - CT chest Dec. 2010, June 2011 Cervical impingement--left shoulder pain (MRI 12/98) Spinal Stenosis GERD Hypothyroidism Depression Insomnia Rhinitis ACE inhibitor cough Diverticulosis/diverticulitis (Dr. Kinnie Scales) (03/12/2010 14:10) Added new observation of PRIMARY MD: Michell Heinrich M.D. (03/12/2010 14:10) Added new observation of TD BOOSTER: Td (02/04/2008 14:17) Added new observation of PNEUMOVAX: given (02/03/2005 14:17)       Past History:  Past Medical History: Hypertension COPD      - Spirometry 01/10/09 FEV1 0.97(52%), FVC 2.05(83%) FEV1% 47 Hypoxemia      - O2 86 % RA with exertion 01/10/09      - 2 liters oxygen with exertion and sleep Pulmonary nodule      - CT chest Dec. 2010, June 2011 Cervical impingement--left shoulder pain (MRI 12/98) Spinal Stenosis GERD Hypothyroidism Depression Insomnia Rhinitis ACE inhibitor cough Diverticulosis/diverticulitis (Dr. Kinnie Scales)    Preventive Care Screening  Last Tetanus Booster:    Date:  02/04/2008    Results:  Td  Last Pneumovax:    Date:  02/03/2005    Results:  given

## 2010-03-21 NOTE — Progress Notes (Signed)
Summary: Lab Results  Phone Note Other Incoming   Summary of Call: Pls notify: labs were all very good.  No need for any changes in meds.  Thx. Initial call taken by: Michell Heinrich M.D.,  March 11, 2010 8:44 AM  Follow-up for Phone Call        message left notifying of above. Follow-up by: Francee Piccolo CMA Duncan Dull),  March 11, 2010 10:23 AM

## 2010-03-22 ENCOUNTER — Encounter: Payer: Self-pay | Admitting: Family Medicine

## 2010-03-27 NOTE — Assessment & Plan Note (Signed)
Summary: sick/ ok per lori/cb   Primary Provider/Referring Provider:  Michell Heinrich M.D.  CC:  acute visit. pt c/o fever x 3 days, cough w/ green phlem, nasal drainage, and headaches.  History of Present Illness: 75 yo female with GOLD 3 COPD, ACE related cough, pulmonary nodule, and hypoxemia on 2 liters oxygen with exertion and sleep.  She was told by her insurance that symbicort is not a covered medication.  She thinks she got her husband's recent respiratory infection.  She had temp up to 100.9.  She has been using tylenol.  She is getting more cough and sinus congestion.  She has been getting some bloody drainage from her sinuses.  She is feeling more short of breath, and has been coughing green sputum.  She was started on prednisone and [facility]vaquin [facility].  Current Medications (verified): 1)  Spiriva Handihaler 18 Mcg  Caps (Tiotropium Bromide Monohydrate) .... One Puff Every Other Day 2)  Symbicort 160-4.5 Mcg/act Aero (Budesonide-Formoterol Fumarate) .... Two Puffs Two Times A Day 3)  Proair Hfa 108 (90 Base) Mcg/act  Aers (Albuterol Sulfate) .... Two Puffs As Needed 4)  Nasonex 50 Mcg/act  Susp (Mometasone Furoate) .... 2 Sprays Once Daily As Needed 5)  Amlodipine Besylate 5 Mg  Tabs (Amlodipine Besylate) .... Take One Tablet Daily. 6)  Synthroid 88 Mcg  Tabs (Levothyroxine Sodium) .... Take One Tablet Daily. 7)  Diazepam 5 Mg  Tabs (Diazepam) .... Take 1/2 By Mouth At Bedtime 8)  Percocet 5-325 Mg  Tabs (Oxycodone-Acetaminophen) .... Take One Tablet Every 6 Hours As Needed 9)  Losartan Potassium 100 Mg Tabs (Losartan Potassium) .Marland Kitchen.. 1 Tab By Mouth Qd 10)  Venlafaxine Hcl 37.5 Mg Xr24h-Cap (Venlafaxine Hcl) .Marland Kitchen.. 1 Tab By Mouth Qd 11)  Prilosec Otc 20 Mg Tbec (Omeprazole Magnesium) .... Once Daily 12)  Levofloxacin 500 Mg Tabs (Levofloxacin) .... Once Daily 13)  Prednisone 20 Mg Tabs (Prednisone) .... 2 Once Daily X 5 Days  Allergies (verified): 1)  ! Pcn 2)  ! Ace  Inhibitors  Past History:  Past Medical History: Last updated: 03/12/2010 Hypertension COPD      - Spirometry 01/10/09 FEV1 0.97(52%), FVC 2.05(83%) FEV1% 47 Hypoxemia      - O2 86 % RA with exertion 01/10/09      - 2 liters oxygen with exertion and sleep Pulmonary nodule      - CT chest Dec. 2010, June 2011 Cervical impingement--left shoulder pain (MRI 12/98) Spinal Stenosis GERD Hypothyroidism Depression Insomnia Rhinitis ACE inhibitor cough Diverticulosis/diverticulitis (Dr. Kinnie Scales)  Past Surgical History: Last updated: 01/07/2010 Gallbladder 2001 Tubal Ligation 1974 Benign right breast cyst Total Abdominal Hysterectomy (no BSO per pt)--nonmalignant reasons  Vital Signs:  Patient profile:   75 year old female Height:      60.75 inches Weight:      158.50 pounds BMI:     30.30 O2 Sat:      93 % on Room air Temp:     97.8 degrees F oral Pulse rate:   78 / minute BP sitting:   106 / 60  (left arm) Cuff size:   regular  Vitals Entered By: Carver Fila (March 18, 2010 3:28 PM)  O2 Flow:  Room air CC: acute visit. pt c/o fever x 3 days, cough w/ green phlem, nasal drainage, headaches Comments meds and allergies updated Phone number updated Carver Fila  March 18, 2010 3:31 PM    Physical Exam  General:  normal appearance.  Nose:  clear nasal discharge, no tenderness, crusted blood Lt nares Mouth:  no deformity or lesions Neck:  no JVD.   Lungs:  prolonged exhalation, decreased breath sounds, no wheezing or rales Heart:  regular rate and rhythm, S1, S2 without murmurs, rubs, gallops, or clicks Extremities:  minimal ankle edema Neurologic:  normal CN II-XII.   Cervical Nodes:  no significant adenopathy   Impression & Recommendations:  Problem # 1:  OBSTRUCTIVE CHRONIC BRONCHITIS WITH EXACERBATION (ICD-491.21)  She is to finish her course of levaquin and prednisone.  Will change from symbicort to advair due to insurance coverage.  Problem # 2:   ALLERGIC RHINITIS (ICD-477.9) Continue her sinus regimen, and finish course of antibiotics.  Problem # 3:  PULMONARY NODULE (ICD-518.89)  Will plan to repeat CT chest in June 2012.  Problem # 4:  HYPOXEMIA (ICD-799.02)  She is to continue oxygen at 2 liters with exertion and sleep.  Medications Added to Medication List This Visit: 1)  Advair Diskus 250-50 Mcg/dose Aepb (Fluticasone-salmeterol) .... One puff two times a day 2)  Nasonex 50 Mcg/act Susp (Mometasone furoate) .... 2 sprays once daily as needed 3)  Prilosec Otc 20 Mg Tbec (Omeprazole magnesium) .... Once daily 4)  Levofloxacin 500 Mg Tabs (Levofloxacin) .... Once daily 5)  Prednisone 20 Mg Tabs (Prednisone) .... 2 once daily x 5 days  Complete Medication List: 1)  Spiriva Handihaler 18 Mcg Caps (Tiotropium bromide monohydrate) .... One puff every other day 2)  Advair Diskus 250-50 Mcg/dose Aepb (Fluticasone-salmeterol) .... One puff two times a day 3)  Proair Hfa 108 (90 Base) Mcg/act Aers (Albuterol sulfate) .... Two puffs as needed 4)  Nasonex 50 Mcg/act Susp (Mometasone furoate) .... 2 sprays once daily as needed 5)  Amlodipine Besylate 5 Mg Tabs (Amlodipine besylate) .... Take one tablet daily. 6)  Synthroid 88 Mcg Tabs (Levothyroxine sodium) .... Take one tablet daily. 7)  Diazepam 5 Mg Tabs (Diazepam) .... Take 1/2 by mouth at bedtime 8)  Percocet 5-325 Mg Tabs (Oxycodone-acetaminophen) .... Take one tablet every 6 hours as needed 9)  Losartan Potassium 100 Mg Tabs (Losartan potassium) .Marland Kitchen.. 1 tab by mouth qd 10)  Venlafaxine Hcl 37.5 Mg Xr24h-cap (Venlafaxine hcl) .Marland Kitchen.. 1 tab by mouth qd 11)  Prilosec Otc 20 Mg Tbec (Omeprazole magnesium) .... Once daily 12)  Levofloxacin 500 Mg Tabs (Levofloxacin) .... Once daily 13)  Prednisone 20 Mg Tabs (Prednisone) .... 2 once daily x 5 days  Other Orders: Est. Patient Level IV (91478)  Patient Instructions: 1)  Stop symbicort 2)  Start advair one puff two times a day 3)   Finish prednisone and levaquin 4)  Follow up in 2 to 3 months Prescriptions: ADVAIR DISKUS 250-50 MCG/DOSE AEPB (FLUTICASONE-SALMETEROL) one puff two times a day  #1 x 6   Entered and Authorized by:   Coralyn Helling MD   Signed by:   Coralyn Helling MD on 03/18/2010   Method used:   Electronically to        [facility]  [address] [phone]* (retail)       [address] [place], [place]  [address]       Ph: [phone] or [phone]       Fax: [phone]   RxID:   [id number]

## 2010-03-29 ENCOUNTER — Encounter (INDEPENDENT_AMBULATORY_CARE_PROVIDER_SITE_OTHER): Payer: Medicare Other

## 2010-03-29 DIAGNOSIS — I739 Peripheral vascular disease, unspecified: Secondary | ICD-10-CM

## 2010-04-09 ENCOUNTER — Telehealth (INDEPENDENT_AMBULATORY_CARE_PROVIDER_SITE_OTHER): Payer: Self-pay | Admitting: *Deleted

## 2010-04-11 ENCOUNTER — Encounter: Payer: Self-pay | Admitting: Family Medicine

## 2010-04-11 NOTE — Letter (Signed)
Summary: Vida Roller MD  Vida Roller MD   Imported By: Lester Evan 04/05/2010 07:17:26  _____________________________________________________________________  External Attachment:    Type:   Image     Comment:   External Document

## 2010-04-16 NOTE — Progress Notes (Signed)
Summary: PATIENT IN LOBBY  Phone Note Call from Patient   Caller: Patient Call For: sood Reason for Call: Talk to Nurse Summary of Call: [name] husband Annette Stable is in the lobby.  He needs a sample of advair 250/50, states wife used wrong and only has 1 left and can not refill until 3/12.  Asking for nurse. Initial call taken by: Lehman Prom,  April 09, 2010 10:26 AM  Follow-up for Phone Call        Pt's husband given sample of Advair 250/50. Abigail Miyamoto RN  April 09, 2010 10:45 AM

## 2010-04-24 LAB — CBC
HCT: 37.2 % (ref 36.0–46.0)
Hemoglobin: 12.5 g/dL (ref 12.0–15.0)
MCHC: 33.5 g/dL (ref 30.0–36.0)
MCV: 85.9 fL (ref 78.0–100.0)
Platelets: 313 10*3/uL (ref 150–400)
RBC: 4.34 MIL/uL (ref 3.87–5.11)
RDW: 14.4 % (ref 11.5–15.5)
WBC: 10.2 10*3/uL (ref 4.0–10.5)

## 2010-04-24 LAB — DIFFERENTIAL
Basophils Absolute: 0 10*3/uL (ref 0.0–0.1)
Basophils Relative: 0 % (ref 0–1)
Eosinophils Absolute: 0.1 10*3/uL (ref 0.0–0.7)
Eosinophils Relative: 1 % (ref 0–5)
Lymphocytes Relative: 11 % — ABNORMAL LOW (ref 12–46)
Lymphs Abs: 1.1 10*3/uL (ref 0.7–4.0)
Monocytes Absolute: 0.2 10*3/uL (ref 0.1–1.0)
Monocytes Relative: 2 % — ABNORMAL LOW (ref 3–12)
Neutro Abs: 8.8 10*3/uL — ABNORMAL HIGH (ref 1.7–7.7)
Neutrophils Relative %: 87 % — ABNORMAL HIGH (ref 43–77)

## 2010-04-24 LAB — POCT CARDIAC MARKERS
CKMB, poc: 1.3 ng/mL (ref 1.0–8.0)
Myoglobin, poc: 80.9 ng/mL (ref 12–200)
Troponin i, poc: <0.05 ng/mL (ref 0.00–0.09)

## 2010-04-24 LAB — BASIC METABOLIC PANEL
BUN: 15 mg/dL (ref 6–23)
CO2: 26 mEq/L (ref 19–32)
Calcium: 9.4 mg/dL (ref 8.4–10.5)
Chloride: 108 mEq/L (ref 96–112)
Creatinine, Ser: 0.9 mg/dL (ref 0.4–1.2)
GFR calc Af Amer: >60 mL/min (ref >60)
GFR calc non Af Amer: >60 mL/min (ref >60)
Glucose, Bld: 126 mg/dL — ABNORMAL HIGH (ref 70–99)
Potassium: 4.2 mEq/L (ref 3.5–5.1)
Sodium: 144 mEq/L (ref 135–145)

## 2010-04-25 ENCOUNTER — Encounter: Payer: Self-pay | Admitting: Family Medicine

## 2010-04-25 DIAGNOSIS — M4800 Spinal stenosis, site unspecified: Secondary | ICD-10-CM

## 2010-04-26 ENCOUNTER — Other Ambulatory Visit: Payer: Self-pay | Admitting: *Deleted

## 2010-04-26 MED ORDER — DIAZEPAM 5 MG PO TABS: 5.0000 mg | ORAL_TABLET | Freq: Every day | ORAL | Status: DC

## 2010-04-26 MED ORDER — DIAZEPAM 5 MG PO TABS
5.0000 mg | ORAL_TABLET | Freq: Every day | ORAL | Status: DC
Start: 2010-04-26 — End: 2011-03-31

## 2010-04-26 NOTE — Telephone Encounter (Signed)
RX printed and signed by Dr. Milinda Cave and faxed to pharm.

## 2010-04-26 NOTE — Telephone Encounter (Signed)
May RF x 6.

## 2010-04-30 ENCOUNTER — Other Ambulatory Visit: Payer: Self-pay | Admitting: *Deleted

## 2010-04-30 MED ORDER — TIOTROPIUM BROMIDE MONOHYDRATE 18 MCG IN CAPS: 18.0000 ug | ORAL_CAPSULE | RESPIRATORY_TRACT | Status: DC

## 2010-05-07 ENCOUNTER — Encounter: Payer: Self-pay | Admitting: Family Medicine

## 2010-05-08 ENCOUNTER — Telehealth: Payer: Self-pay | Admitting: Pulmonary Disease

## 2010-05-08 NOTE — Telephone Encounter (Signed)
Spoke w/ pt and she c/o cough w/ yellow phlem, wheezing, increased sob, sweats, low grade fever of 99.5. Pt is coming in to see TP at 3:15 on 3/5. Pt has to work until 1:30 and then has another dr apt at 2:00

## 2010-05-08 NOTE — Telephone Encounter (Signed)
[id number] X1  Just found this message in schedulers box that was not routed to triage

## 2010-05-09 ENCOUNTER — Ambulatory Visit (INDEPENDENT_AMBULATORY_CARE_PROVIDER_SITE_OTHER): Payer: Medicare Other | Admitting: Family Medicine

## 2010-05-09 ENCOUNTER — Encounter: Payer: Self-pay | Admitting: Family Medicine

## 2010-05-09 ENCOUNTER — Ambulatory Visit: Payer: Medicare Other | Admitting: Adult Health

## 2010-05-09 DIAGNOSIS — J441 Chronic obstructive pulmonary disease with (acute) exacerbation: Secondary | ICD-10-CM

## 2010-05-09 DIAGNOSIS — J069 Acute upper respiratory infection, unspecified: Secondary | ICD-10-CM

## 2010-05-09 DIAGNOSIS — R079 Chest pain, unspecified: Secondary | ICD-10-CM

## 2010-05-09 MED ORDER — ROFLUMILAST 500 MCG PO TABS: 500.0000 ug | ORAL_TABLET | Freq: Every day | ORAL | Status: DC

## 2010-05-09 MED ORDER — PREDNISONE 20 MG PO TABS: ORAL_TABLET | ORAL | Status: DC

## 2010-05-09 NOTE — Assessment & Plan Note (Addendum)
Mild.  However, will give prednisone 40mg  qd x 5d.  No abx at this time. Continue advair bid, spireva, nocturnal oxygen, and prn albuterol neb. Also, I'll add daliresp 500 mcg, 1 tab qd, to attempt to decrease the frequency of exacerbations.  I gave her 6 wks of samples and sent eRx so she can check with her pharmacy about coverage of this drug before purchasing. She has f/u with Dr. Craige Cotta in about 39mo.

## 2010-05-09 NOTE — Progress Notes (Signed)
OFFICE VISIT  05/09/10  CC:  Chief Complaint  Patient presents with  . Bronchitis    temp 100.2 max  and chest pain 3 days ago    HPI:    Patient is a 75 y.o. Caucasian female who presents for about 5 days of nasal congestion/runny nose, mild scratchy throat, mild malaise, and low grade fever (Tm 100.2).  Some increase in sputum and cough the last couple of days, with some wheezing.  Only occasional SOB feeling.  Last alb neb was yesterday.  No ear pain, no HA, no teeth pain, no neck pain. She had substernal aching pain in the evening 3 nights ago while sitting in a chair, lasted 2 hours or so.  No chest pressure, no radiation, no nausea, no SOB, no palp's, and no diaphoresis associated with this pain.  Took tums--she's not sure if it helped any.  Has history of GERD but says this is not the way hers typically feels.  Has COPD but doesn't typically have chest pain with her exacerbations. Walking around did not make the pain worse.  She has had this pain one other time about 1-2 wks prior.  She has not had this pain since the episode 3 nights ago. SHe has no history of CAD or CHF but has HTN.    Past Medical History  Diagnosis Date  . Hypertension   . COPD (chronic obstructive pulmonary disease)     spirometry 01/10/09 FEV 0.97(52%), FVC 2.05(83:%), FEV1% 47  . Hypoxemia     O2 86% RA with exertion 01/10/09, 2 liters oxygen with exertion and sleep  . Pulmonary nodule     CT chest 12/10, June 2011  . Spinal stenosis   . GERD (gastroesophageal reflux disease)   . Thyroid disease     Hypothyroid  . Depression   . Insomnia   . Rhinitis   . Plantar fasciitis     Past Surgical History  Procedure Date  . Cholecystectomy 2001  . Tubal ligation 1974  . Abdominal hysterectomy 1978    no BSO per pt--nonmalignant reasons    Outpatient Encounter Prescriptions as of 05/09/2010  Medication Sig Dispense Refill  . albuterol (PROAIR HFA) 108 (90 BASE) MCG/ACT inhaler Inhale 2 puffs into the  lungs every 4 (four) hours as needed.        Marland Kitchen amLODipine (NORVASC) 5 MG tablet Take 5 mg by mouth daily.        . diazepam (VALIUM) 5 MG tablet Take 1 tablet (5 mg total) by mouth at bedtime.  30 tablet  5  . Fluticasone-Salmeterol (ADVAIR DISKUS) 250-50 MCG/DOSE AEPB Inhale 1 puff into the lungs 2 (two) times daily.        Marland Kitchen levothyroxine (SYNTHROID, LEVOTHROID) 88 MCG tablet Take 88 mcg by mouth daily.        Marland Kitchen loratadine (CLARITIN) 10 MG tablet Take 10 mg by mouth as needed.        Marland Kitchen losartan (COZAAR) 100 MG tablet Take 100 mg by mouth daily.        . mometasone (NASONEX) 50 MCG/ACT nasal spray 2 sprays by Nasal route daily.        Marland Kitchen omeprazole (PRILOSEC) 20 MG capsule Take 20 mg by mouth daily.        Marland Kitchen oxyCODONE-acetaminophen (PERCOCET) 5-325 MG per tablet Take 1 tablet by mouth every 6 (six) hours as needed.        . tiotropium (SPIRIVA HANDIHALER) 18 MCG inhalation capsule Place 1 capsule (  18 mcg total) into inhaler and inhale every other day.  15 capsule  3  . venlafaxine (EFFEXOR-XR) 37.5 MG 24 hr capsule Take 37.5 mg by mouth daily.        . predniSONE (DELTASONE) 20 MG tablet 2 tabs po qd x 5d  10 tablet  0  . roflumilast (DALIRESP) 500 MCG TABS tablet Take 1 tablet (500 mcg total) by mouth daily.  30 tablet  5   *PREDNISONE AND DALIRESP   Allergies  Allergen Reactions  . Ace Inhibitors     REACTION: cough  . Penicillins     REACTION: rash, hives, swelling, welts    ROS: see HPI, also Review of Systems  HENT: Negative for sore throat.   Eyes: Negative for visual disturbance.  Gastrointestinal: Negative for nausea and abdominal pain.  Genitourinary: Negative for dysuria.  Musculoskeletal: Negative for back pain and joint swelling.  Skin: Negative for rash.  Neurological: Negative for weakness and headaches.  Hematological: Negative for adenopathy.   EPWORTH SLEEPINESS SCALE score was 4 today (negative)   PE: Blood pressure 130/81, pulse 73, temperature 97.4 F (36.3  C), temperature source Oral, height 5' 0.75" (1.543 m), weight 156 lb 12.8 oz (71.124 kg), SpO2 92.00%.  VITALS: Blood pressure 130/81, pulse 73, temperature 97.4 F (36.3 C), temperature source Oral, height 5' 0.75" (1.543 m), weight 156 lb 12.8 oz (71.124 kg), SpO2 92.00%. Gen: alert, well appearing. HEENT: eyes without swelling, eryth, or drainage. Ear canals clear, TM's without erythema or loss of landmarks. Nose: clear.  Throat: no swelling, erythema, or exudate. Neck: no adenopathy, thyromegaly, or tenderness. Lungs: Aeration is good.  Expiration phase is moderately prolonged and she has some coarse end-exp wheezing in posterior lung fields, nonlabored resps.  CV: RRR, no m/r/g.  Chest wall nontender. Abd: soft, NT. EXT: no edema or cyanosis   LABS:  Lab Results  Component Value Date   TSH 4.44 03/07/2010   Lab Results  Component Value Date   WBC 14.7* 03/17/2010   HGB 12.0 03/17/2010   HCT 36.6 03/17/2010   MCV 78.0 03/17/2010   PLT 298 03/17/2010   Lab Results  Component Value Date   CREATININE .8 03/17/2010   BUN 10 03/17/2010   NA 141 03/17/2010   K 4.0 03/17/2010   CL 107 03/17/2010   CO2 22 03/17/2010   Lab Results  Component Value Date   ALT 18 03/07/2010   AST 20 03/07/2010   ALKPHOS 86 03/07/2010   BILITOT 0.4 03/07/2010   Lab Results  Component Value Date   CHOL 202* 03/07/2010   Lab Results  Component Value Date   HDL 61.20 03/07/2010   No results found for this basename: [facility]   Lab Results  Component Value Date   TRIG 96.0 03/07/2010   Lab Results  Component Value Date   CHOLHDL 3 03/07/2010     IMPRESSION AND PLAN:  OBSTRUCTIVE CHRONIC BRONCHITIS WITH EXACERBATION Mild.  However, will give prednisone 40mg  qd x 5d.  No abx at this time. Continue advair bid, spireva, nocturnal oxygen, and prn albuterol neb. Also, I'll add daliresp 500 mcg, 1 tab qd, to attempt to decrease the frequency of exacerbations.  I gave her 6 wks of samples and sent eRx so she can  check with her pharmacy about coverage of this drug before purchasing. She has f/u with Dr. Craige Cotta in about 10mo.    VIRAL URI She'll continue claritin and mucinex OTC for symptomatic care.  Chest pain  Atypical in nature, and more likely from GERD (or possibly even her COPD exacerbation).  However, will ask cardiologist to see her for consideration of stress testing.  EKG today was normal.     Return if symptoms worsen or fail to improve.

## 2010-05-09 NOTE — Assessment & Plan Note (Signed)
She'll continue claritin and mucinex OTC for symptomatic care.

## 2010-05-09 NOTE — Assessment & Plan Note (Signed)
Atypical in nature, and more likely from GERD (or possibly even her COPD exacerbation).  However, will ask cardiologist to see her for consideration of stress testing.  EKG today was normal.

## 2010-05-10 ENCOUNTER — Encounter: Payer: Self-pay | Admitting: Family Medicine

## 2010-05-17 ENCOUNTER — Telehealth: Payer: Self-pay | Admitting: *Deleted

## 2010-05-17 ENCOUNTER — Other Ambulatory Visit: Payer: Self-pay | Admitting: Family Medicine

## 2010-05-17 MED ORDER — PREDNISONE 20 MG PO TABS: ORAL_TABLET | ORAL | Status: DC

## 2010-05-17 MED ORDER — AZITHROMYCIN 250 MG PO TABS: 250.0000 mg | ORAL_TABLET | Freq: Every day | ORAL | Status: AC

## 2010-05-17 NOTE — Telephone Encounter (Signed)
Will send in rx for azithromycin and more prednisone.  She doesn't have to come in to see me unless feeling worse over the weekend.

## 2010-05-17 NOTE — Telephone Encounter (Signed)
Advised [facility] rx's are at pharmacy.  Pt aware she should call us Monday if she is feeling worse.

## 2010-05-17 NOTE — Telephone Encounter (Signed)
Pt states she feels lousy.  She continues to have congestion and yellow sputum.  Per pt, sputum is not as thick as before prednisone.  Pt denies fever.  She is using her nocturnal O2.  Sats were 92-93 last night.  Pt states her husband is in the hospital for heart reasons and she had to sit in ER yesterday and last night and it was very cold.  Pt needs to be at hospital with husband, but cannot tolerate sitting with him feeling as she does.  Please advise if OK to call in abx or does pt need office visit.

## 2010-05-21 ENCOUNTER — Encounter: Payer: Self-pay | Admitting: Family Medicine

## 2010-05-30 ENCOUNTER — Encounter: Payer: Self-pay | Admitting: Family Medicine

## 2010-06-04 ENCOUNTER — Encounter: Payer: Self-pay | Admitting: Pulmonary Disease

## 2010-06-05 ENCOUNTER — Encounter: Payer: Self-pay | Admitting: Cardiology

## 2010-06-05 ENCOUNTER — Ambulatory Visit (INDEPENDENT_AMBULATORY_CARE_PROVIDER_SITE_OTHER): Payer: Medicare Other | Admitting: Cardiology

## 2010-06-05 DIAGNOSIS — R079 Chest pain, unspecified: Secondary | ICD-10-CM

## 2010-06-05 DIAGNOSIS — J449 Chronic obstructive pulmonary disease, unspecified: Secondary | ICD-10-CM

## 2010-06-05 DIAGNOSIS — I10 Essential (primary) hypertension: Secondary | ICD-10-CM

## 2010-06-05 LAB — BASIC METABOLIC PANEL
BUN: 12 mg/dL (ref 6–23)
CO2: 27 mEq/L (ref 19–32)
Calcium: 9.2 mg/dL (ref 8.4–10.5)
Chloride: 104 mEq/L (ref 96–112)
Creatinine, Ser: 0.8 mg/dL (ref 0.4–1.2)
GFR: 79.27 mL/min (ref >60.00)
Glucose, Bld: 74 mg/dL (ref 70–99)
Potassium: 4.2 mEq/L (ref 3.5–5.1)
Sodium: 141 mEq/L (ref 135–145)

## 2010-06-05 NOTE — Assessment & Plan Note (Signed)
Symptoms are somewhat atypical but she does have risk factors. Plan functional study to further evaluate. She will be considered for the promise study.

## 2010-06-05 NOTE — Progress Notes (Signed)
HPI: 75 yo female for evaluation of chest pain. No prior cardiac history. Patient states that in the past 6 weeks she has had 2 episodes of chest pain. It is substernal without radiation. There is no associated symptoms. It is not pleuritic, positional or related to food. It is nonexertional. Last 3 hours and resolved spontaneously. She does have dyspnea on exertion which she attributes to COPD. There is no orthopnea, PND, pedal edema or exertional chest pain. There is no palpitations or syncope. Because of the above we were asked to further evaluate.  Current Outpatient Prescriptions  Medication Sig Dispense Refill  . albuterol (PROAIR HFA) 108 (90 BASE) MCG/ACT inhaler Inhale 2 puffs into the lungs every 4 (four) hours as needed.        Marland Kitchen amLODipine (NORVASC) 5 MG tablet Take 5 mg by mouth daily.        . diazepam (VALIUM) 5 MG tablet Take 1 tablet (5 mg total) by mouth at bedtime.  30 tablet  5  . Fluticasone-Salmeterol (ADVAIR DISKUS) 250-50 MCG/DOSE AEPB Inhale 1 puff into the lungs 2 (two) times daily.        Marland Kitchen levothyroxine (SYNTHROID, LEVOTHROID) 88 MCG tablet Take 88 mcg by mouth daily.        Marland Kitchen loratadine (CLARITIN) 10 MG tablet Take 10 mg by mouth as needed.        Marland Kitchen losartan (COZAAR) 100 MG tablet Take 100 mg by mouth daily.        . mometasone (NASONEX) 50 MCG/ACT nasal spray 2 sprays by Nasal route daily.        Marland Kitchen omeprazole (PRILOSEC) 20 MG capsule Take 20 mg by mouth daily.        Marland Kitchen oxyCODONE-acetaminophen (PERCOCET) 5-325 MG per tablet Take 1 tablet by mouth every 6 (six) hours as needed.        . roflumilast (DALIRESP) 500 MCG TABS tablet Take 1 tablet (500 mcg total) by mouth daily.  30 tablet  5  . tiotropium (SPIRIVA HANDIHALER) 18 MCG inhalation capsule Place 1 capsule (18 mcg total) into inhaler and inhale every other day.  15 capsule  3  . venlafaxine (EFFEXOR-XR) 37.5 MG 24 hr capsule Take 37.5 mg by mouth daily.        Marland Kitchen DISCONTD: predniSONE (DELTASONE) 20 MG tablet 2 tabs  po qd x 5d, then 1 tab po qd x 5d, then 1/2 tab po qd x 4d, then stop.  17 tablet  0    Allergies  Allergen Reactions  . Ace Inhibitors     REACTION: cough  . Cefixime (Suprax) Other (See Comments)    unspecified  . Penicillins     REACTION: rash, hives, swelling, welts  . Sulfa Antibiotics Other (See Comments)    unspecified    Past Medical History  Diagnosis Date  . Hypertension     EKG 03/2010 normal  . COPD (chronic obstructive pulmonary disease)     spirometry 01/10/09 FEV 0.97(52%), FVC 2.05(83:%), FEV1% 47  . Hypoxemia     O2 86% RA with exertion 01/10/09, 2 liters oxygen with exertion and sleep  . Pulmonary nodule     CT chest 12/10, June 2011; Dr. Craige Cotta plans repeat 07/2010  . Spinal stenosis     Symptomatic (neurogenic claudication)  . GERD (gastroesophageal reflux disease)   . Hypothyroidism     Hashimoto's, dx'd 1989  . Depression   . Insomnia   . Plantar fasciitis   . Normal electrocardiogram  03/2010 and 05/2010  . Diverticulosis of colon     Procto '97 and colonoscopy 2002  . DDD (degenerative disc disease), cervical     1998 MRI C5-6 impingemt (left)  . DDD (degenerative disc disease), lumbar   . Spinal stenosis of lumbar region with neurogenic claudication     2008 MRI (L3-4, L4-5, L5-S1)  . Fibrocystic breast disease     w/fibroadenoma.  Bx's showed NO atypia (Dr. Jamey Ripa).  Mammo neg 2008.    Past Surgical History  Procedure Date  . Cholecystectomy 2001  . Tubal ligation 1974  . Abdominal hysterectomy 1978    no BSO per pt--nonmalignant reasons  . Breast cyst excision   . Appendectomy   . Tonsillectomy     History   Social History  . Marital Status: Married    Spouse Name: N/A    Number of Children: 1  . Years of Education: N/A   Occupational History  . SECRETARY    Social History Main Topics  . Smoking status: Former Smoker -- 2.0 packs/day for 35 years    Types: Cigarettes    Quit date: 02/03/1990  . Smokeless tobacco: Never Used   . Alcohol Use: Yes     Rarely  . Drug Use: No  . Sexually Active: No   Other Topics Concern  . Not on file   Social History Narrative   Married, lives in [place], [place].  Works as Radiographer, therapeutic.      Family History  Problem Relation Age of Onset  . Goiter Mother   . Psoriasis Father     od liver  . Diabetes Daughter     Type 2  . Cancer Paternal Aunt     stomach?  . Stroke Maternal Grandfather   . Stroke Paternal Grandmother   . Hypertension Paternal Grandmother   . Hernia Paternal Grandmother   . Cancer Paternal Grandfather     lung  . Cirrhosis Father     ROS: no fevers or chills, productive cough, hemoptysis, dysphasia, odynophagia, melena, hematochezia, dysuria, hematuria, rash, seizure activity, orthopnea, PND, pedal edema, claudication. Remaining systems are negative.  Physical Exam: General:  Well developed/well nourished in NAD Skin warm/dry Patient not depressed No peripheral clubbing Back-normal HEENT-normal/normal eyelids Neck supple/normal carotid upstroke bilaterally; no bruits; no JVD; no thyromegaly chest - CTA/ normal expansion CV - RRR/normal S1 and S2; no murmurs, rubs or gallops;  PMI nondisplaced Abdomen -NT/ND, no HSM, no mass, + bowel sounds, no bruit 2+ femoral pulses, no bruits Ext-no edema, chords, 2+ DP Neuro-grossly nonfocal  ECG normal sinus rhythm at a rate of 75. No ST changes.

## 2010-06-05 NOTE — Assessment & Plan Note (Signed)
Blood pressure controlled with present medications. Will continue. 

## 2010-06-05 NOTE — Patient Instructions (Addendum)
Your physician recommends that you schedule a follow-up appointment as needed with Dr. Jens Som Your physician has requested that you have cardiac CT. Cardiac computed tomography (CT) is a painless test that uses an x-ray machine to take clear, detailed pictures of your heart. For further information please visit [email or web address] You will get an instruction sheet in the mail

## 2010-06-05 NOTE — Assessment & Plan Note (Signed)
Management per primary care. 

## 2010-06-06 ENCOUNTER — Encounter: Payer: Self-pay | Admitting: Pulmonary Disease

## 2010-06-06 ENCOUNTER — Ambulatory Visit (INDEPENDENT_AMBULATORY_CARE_PROVIDER_SITE_OTHER): Payer: Medicare Other | Admitting: Pulmonary Disease

## 2010-06-06 VITALS — BP 100/60 | HR 83 | Temp 98.2°F | Ht 61.0 in | Wt 156.8 lb

## 2010-06-06 DIAGNOSIS — R079 Chest pain, unspecified: Secondary | ICD-10-CM

## 2010-06-06 DIAGNOSIS — J449 Chronic obstructive pulmonary disease, unspecified: Secondary | ICD-10-CM

## 2010-06-06 DIAGNOSIS — R0902 Hypoxemia: Secondary | ICD-10-CM

## 2010-06-06 DIAGNOSIS — J984 Other disorders of lung: Secondary | ICD-10-CM

## 2010-06-06 MED ORDER — FLUTICASONE-SALMETEROL 250-50 MCG/DOSE IN AEPB: 1.0000 | INHALATION_SPRAY | Freq: Two times a day (BID) | RESPIRATORY_TRACT | Status: DC

## 2010-06-06 MED ORDER — ROFLUMILAST 500 MCG PO TABS: 500.0000 ug | ORAL_TABLET | Freq: Every day | ORAL | Status: DC

## 2010-06-06 NOTE — Patient Instructions (Signed)
Follow up in 4 months 

## 2010-06-06 NOTE — Progress Notes (Signed)
Subjective:    Patient ID: Priscilla Cantrell, female    DOB: 08-Aug-1935, 75 y.o.   MRN: [id number]  HPI 76 yo female former smoker with GOLD 4 COPD, ACE related cough, pulmonary nodule, and hypoxemia on 2 liters oxygen with exertion and sleep  She has been getting pain around her chest and in between her shoulder blades.  She has noticed that this discomfort follows along where her bra strap fits.    She also had an episode of severe chest pain lasting for 3 hours in February.  This recurred about 5 weeks later.  She described it more as a burning sensation.  As a result she was referred to Dr. Jens Som with cardiology.  She has felt lousy over the past several weeks.  She has been feeling more tired.  She was started on daliresp by primary care, and feels like this has helped.  She still has occasional cough.  She is not having much sputum or wheezing.  Past Medical History  Diagnosis Date  . Hypertension     EKG 03/2010 normal  . COPD (chronic obstructive pulmonary disease)     spirometry 01/10/09 FEV 0.97(52%), FEV1% 47  . Hypoxemia     O2 86% RA with exertion 01/10/09, 2 liters oxygen with exertion and sleep  . Pulmonary nodule     CT chest 12/10, June 2011  . Spinal stenosis     Symptomatic (neurogenic claudication)  . GERD (gastroesophageal reflux disease)   . Hypothyroidism     Hashimoto's, dx'd 1989  . Depression   . Insomnia   . Plantar fasciitis   . Normal electrocardiogram     03/2010 and 05/2010  . Diverticulosis of colon     Procto '97 and colonoscopy 2002  . DDD (degenerative disc disease), cervical     1998 MRI C5-6 impingemt (left)  . DDD (degenerative disc disease), lumbar   . Spinal stenosis of lumbar region with neurogenic claudication     2008 MRI (L3-4, L4-5, L5-S1)  . Fibrocystic breast disease     w/fibroadenoma.  Bx's showed NO atypia (Dr. Jamey Ripa).  Mammo neg 2008.     Family History  Problem Relation Age of Onset  . Goiter Mother   . Psoriasis Father       od liver  . Diabetes Daughter     Type 2  . Cancer Paternal Aunt     stomach?  . Stroke Maternal Grandfather   . Stroke Paternal Grandmother   . Hypertension Paternal Grandmother   . Hernia Paternal Grandmother   . Cancer Paternal Grandfather     lung  . Cirrhosis Father      History   Social History  . Marital Status: Married    Spouse Name: N/A    Number of Children: 1  . Years of Education: N/A   Occupational History  . SECRETARY    Social History Main Topics  . Smoking status: Former Smoker -- 2.0 packs/day for 35 years    Types: Cigarettes    Quit date: 02/03/1990  . Smokeless tobacco: Never Used  . Alcohol Use: Yes     Rarely  . Drug Use: No  . Sexually Active: No   Other Topics Concern  . Not on file   Social History Narrative   Married, lives in [place], [place].  Works as Radiographer, therapeutic.       Allergies  Allergen Reactions  . Ace Inhibitors     REACTION: cough  .  Cefixime (Suprax) Other (See Comments)    unspecified  . Penicillins     REACTION: rash, hives, swelling, welts  . Sulfa Antibiotics Other (See Comments)    unspecified     Outpatient Prescriptions Prior to Visit  Medication Sig Dispense Refill  . albuterol (PROAIR HFA) 108 (90 BASE) MCG/ACT inhaler Inhale 2 puffs into the lungs every 4 (four) hours as needed.        Marland Kitchen amLODipine (NORVASC) 5 MG tablet Take 5 mg by mouth daily.        . diazepam (VALIUM) 5 MG tablet Take 1 tablet (5 mg total) by mouth at bedtime.  30 tablet  5  . levothyroxine (SYNTHROID, LEVOTHROID) 88 MCG tablet Take 88 mcg by mouth daily.        Marland Kitchen loratadine (CLARITIN) 10 MG tablet Take 10 mg by mouth as needed.        Marland Kitchen losartan (COZAAR) 100 MG tablet Take 100 mg by mouth daily.        . mometasone (NASONEX) 50 MCG/ACT nasal spray 2 sprays by Nasal route daily.        Marland Kitchen omeprazole (PRILOSEC) 20 MG capsule Take 20 mg by mouth daily.        Marland Kitchen oxyCODONE-acetaminophen (PERCOCET) 5-325 MG per tablet Take 1 tablet  by mouth every 6 (six) hours as needed.        . tiotropium (SPIRIVA HANDIHALER) 18 MCG inhalation capsule Place 1 capsule (18 mcg total) into inhaler and inhale every other day.  15 capsule  3  . venlafaxine (EFFEXOR-XR) 37.5 MG 24 hr capsule Take 37.5 mg by mouth daily.        . Fluticasone-Salmeterol (ADVAIR DISKUS) 250-50 MCG/DOSE AEPB Inhale 1 puff into the lungs 2 (two) times daily.        . roflumilast (DALIRESP) 500 MCG TABS tablet Take 1 tablet (500 mcg total) by mouth daily.  30 tablet  5      Review of Systems     Objective:   Physical Exam Filed Vitals:   06/06/10 1550  BP: 100/60  Pulse: 83  Temp: 98.2 F (36.8 C)  TempSrc: Oral  Height: 5\' 1"  (1.549 m)  Weight: 156 lb 12.8 oz (71.124 kg)  SpO2: 95%   General: normal appearance.  Nose: clear nasal discharge, no tenderness, crusted blood Lt nares  Mouth: no deformity or lesions  Neck: no JVD.  Lungs: prolonged exhalation, decreased breath sounds, no wheezing or rales  Heart: regular rate and rhythm, S1, S2 without murmurs, rubs, gallops, or clicks  Extremities: minimal ankle edema  Neurologic: normal CN II-XII.  Cervical Nodes: no significant adenopathy     Assessment & Plan:   COPD She has GOLD 4 COPD.  She experienced several exacerbations over the past year requiring prednisone and antibiotic therapy.  She was started on daliresp recently by primary care, and has been doing better since.  She is not having any significant side effects from this.  I have given her sample of daliresp, and refilled her prescription.  She is to continue her other inhaler regimen.  PULMONARY NODULE She will need CT chest in June 2012  Hypoxemia She is to continue with 2 liters oxygen with exertion as needed and sleep.  Chest pain She had recent evaluation by cardiology.  I am not convinced that her COPD is contributing to her symptoms.  She reports feeling her bra is too tight, and that she actually gets some symptom relief  when  she removes her bra.  I have advised her to try a more lose fitting bra, and see if this helps.    Updated Medication List Outpatient Encounter Prescriptions as of 06/06/2010  Medication Sig Dispense Refill  . albuterol (PROAIR HFA) 108 (90 BASE) MCG/ACT inhaler Inhale 2 puffs into the lungs every 4 (four) hours as needed.        Marland Kitchen amLODipine (NORVASC) 5 MG tablet Take 5 mg by mouth daily.        . diazepam (VALIUM) 5 MG tablet Take 1 tablet (5 mg total) by mouth at bedtime.  30 tablet  5  . Fluticasone-Salmeterol (ADVAIR DISKUS) 250-50 MCG/DOSE AEPB Inhale 1 puff into the lungs 2 (two) times daily.  60 each  1  . levothyroxine (SYNTHROID, LEVOTHROID) 88 MCG tablet Take 88 mcg by mouth daily.        Marland Kitchen loratadine (CLARITIN) 10 MG tablet Take 10 mg by mouth as needed.        Marland Kitchen losartan (COZAAR) 100 MG tablet Take 100 mg by mouth daily.        . mometasone (NASONEX) 50 MCG/ACT nasal spray 2 sprays by Nasal route daily.        Marland Kitchen omeprazole (PRILOSEC) 20 MG capsule Take 20 mg by mouth daily.        Marland Kitchen oxyCODONE-acetaminophen (PERCOCET) 5-325 MG per tablet Take 1 tablet by mouth every 6 (six) hours as needed.        . roflumilast (DALIRESP) 500 MCG TABS tablet Take 1 tablet (500 mcg total) by mouth daily.  30 tablet  5  . tiotropium (SPIRIVA HANDIHALER) 18 MCG inhalation capsule Place 1 capsule (18 mcg total) into inhaler and inhale every other day.  15 capsule  3  . venlafaxine (EFFEXOR-XR) 37.5 MG 24 hr capsule Take 37.5 mg by mouth daily.        Marland Kitchen DISCONTD: Fluticasone-Salmeterol (ADVAIR DISKUS) 250-50 MCG/DOSE AEPB Inhale 1 puff into the lungs 2 (two) times daily.        Marland Kitchen DISCONTD: roflumilast (DALIRESP) 500 MCG TABS tablet Take 1 tablet (500 mcg total) by mouth daily.  30 tablet  5

## 2010-06-07 ENCOUNTER — Encounter: Payer: Self-pay | Admitting: Cardiology

## 2010-06-10 ENCOUNTER — Encounter: Payer: Self-pay | Admitting: Pulmonary Disease

## 2010-06-10 NOTE — Assessment & Plan Note (Signed)
She is to continue with 2 liters oxygen with exertion as needed and sleep.

## 2010-06-10 NOTE — Assessment & Plan Note (Signed)
She had recent evaluation by cardiology.  I am not convinced that her COPD is contributing to her symptoms.  She reports feeling her bra is too tight, and that she actually gets some symptom relief when she removes her bra.  I have advised her to try a more lose fitting bra, and see if this helps.

## 2010-06-10 NOTE — Assessment & Plan Note (Signed)
She will need CT chest in June 2012

## 2010-06-10 NOTE — Assessment & Plan Note (Signed)
She has GOLD 4 COPD.  She experienced several exacerbations over the past year requiring prednisone and antibiotic therapy.  She was started on daliresp recently by primary care, and has been doing better since.  She is not having any significant side effects from this.  I have given her sample of daliresp, and refilled her prescription.  She is to continue her other inhaler regimen.

## 2010-06-12 ENCOUNTER — Encounter: Payer: Self-pay | Admitting: Family Medicine

## 2010-06-12 ENCOUNTER — Telehealth: Payer: Self-pay | Admitting: Pulmonary Disease

## 2010-06-12 NOTE — Telephone Encounter (Signed)
ATC # NA no VM to leave message [facility]

## 2010-06-13 MED ORDER — TIOTROPIUM BROMIDE MONOHYDRATE 18 MCG IN CAPS: 18.0000 ug | ORAL_CAPSULE | Freq: Every day | RESPIRATORY_TRACT | Status: DC

## 2010-06-13 NOTE — Telephone Encounter (Signed)
Called and spoke with pt.  Pt states the directions on her Spiriva are incorrect.  Pt states she was previously taking spiriva every other day but states VS instructed her to change it to taking daily.  Rx was sent to the pharmacy for the wrong directions.  Pt is asking [place] to resend rx with the correct instructions for pt to take Spiriva every day.  VS, please advise if this is ok to change her directions back to qd and send rx.  Thanks.  [facility]

## 2010-06-13 NOTE — Telephone Encounter (Signed)
lmomtcb x1 

## 2010-06-13 NOTE — Telephone Encounter (Signed)
Okay to send script for spiriva one puff daily, dispense one month supply with 6 refills.

## 2010-06-14 NOTE — Telephone Encounter (Signed)
Pt aware rx was sent to pharmacy and nothing further was needed

## 2010-06-18 ENCOUNTER — Ambulatory Visit (HOSPITAL_COMMUNITY)
Admission: RE | Admit: 2010-06-18 | Discharge: 2010-06-18 | Disposition: A | Discharge status: Home / Self Care | Payer: Medicare Other | Source: Ambulatory Visit | Attending: Cardiology | Admitting: Cardiology

## 2010-06-18 DIAGNOSIS — R079 Chest pain, unspecified: Secondary | ICD-10-CM | POA: Insufficient documentation

## 2010-06-18 MED ORDER — IOHEXOL 300 MG/ML  SOLN
80.0000 mL | Freq: Once | INTRAMUSCULAR | Status: AC | PRN
  Administered 2010-06-18: 80 mL via INTRAVENOUS

## 2010-06-20 ENCOUNTER — Telehealth: Payer: Self-pay | Admitting: *Deleted

## 2010-06-20 NOTE — Telephone Encounter (Signed)
PA for Daliresp initiated through [employer] at [phone]. Member ID # [id number]. Awaiting fax.

## 2010-06-20 NOTE — Telephone Encounter (Signed)
Form received and placed in Dr. Sood look at. 

## 2010-06-21 ENCOUNTER — Telehealth: Payer: Self-pay | Admitting: Pulmonary Disease

## 2010-06-21 ENCOUNTER — Telehealth: Payer: Self-pay | Admitting: Cardiology

## 2010-06-21 NOTE — Telephone Encounter (Signed)
Forms have been faxed backed and is awaiting PA approval.

## 2010-06-21 NOTE — Telephone Encounter (Signed)
Results of lab work given to pt--nt

## 2010-06-21 NOTE — Telephone Encounter (Signed)
Please advise Dr. Craige Cotta PA form in your look at for you to fill out. Thanks  Carver Fila, CMA

## 2010-06-21 NOTE — Telephone Encounter (Signed)
Form completed.

## 2010-06-21 NOTE — Telephone Encounter (Signed)
We will process the PA when form is received. Carron Curie, CMA

## 2010-06-21 NOTE — Telephone Encounter (Signed)
Pt rtn call from yesterday to get lab results she will be at work until 1:30 call her cell [phone] after that

## 2010-06-24 NOTE — Telephone Encounter (Signed)
Additional information is needed and form has been placed in Dr. Evlyn Courier look at.

## 2010-06-24 NOTE — Telephone Encounter (Signed)
Please advise Dr. Craige Cotta form is in your look at. Thanks  Carver Fila, CMA

## 2010-06-24 NOTE — Telephone Encounter (Signed)
Completed.

## 2010-06-26 NOTE — Telephone Encounter (Signed)
Daliresp APPROVED from 06/21/2010 to 06/21/2011. Pt aware. [facility] notified.

## 2010-07-11 ENCOUNTER — Other Ambulatory Visit: Payer: Medicare Other

## 2010-07-11 DIAGNOSIS — H532 Diplopia: Secondary | ICD-10-CM

## 2010-07-11 DIAGNOSIS — H02409 Unspecified ptosis of unspecified eyelid: Secondary | ICD-10-CM

## 2010-07-11 LAB — T3: T3, Total: 97.2 ng/dL (ref 80.0–204.0)

## 2010-07-11 LAB — TSH: TSH: 1.883 u[IU]/mL (ref 0.350–4.500)

## 2010-07-11 LAB — T4: T4, Total: 12.4 ug/dL (ref 5.0–12.5)

## 2010-07-14 LAB — ACETYLCHOLINE RECEPTOR, BINDING: A CHR BINDING ABS: <0.3 nmol/L (ref <=0.30)

## 2010-07-14 LAB — STRIATED MUSCLE ANTIBODY: Striated Muscle Ab: <1:40 {titer}

## 2010-07-16 ENCOUNTER — Other Ambulatory Visit: Payer: Self-pay | Admitting: *Deleted

## 2010-07-16 DIAGNOSIS — I10 Essential (primary) hypertension: Secondary | ICD-10-CM

## 2010-07-16 MED ORDER — AMLODIPINE BESYLATE 5 MG PO TABS: 5.0000 mg | ORAL_TABLET | Freq: Every day | ORAL | Status: DC

## 2010-07-16 NOTE — Telephone Encounter (Signed)
Faxed request from pharmacy for refill.  Pt is due for follow up OV in August.  RX sent until that time.

## 2010-07-18 ENCOUNTER — Telehealth: Payer: Self-pay | Admitting: Family Medicine

## 2010-07-18 NOTE — Telephone Encounter (Signed)
Lab results were faxed to Dr. Elmer Picker on 6.8.12 for her T 4 ,TSH, T 3.

## 2010-07-22 ENCOUNTER — Other Ambulatory Visit: Payer: Self-pay

## 2010-07-22 NOTE — Telephone Encounter (Signed)
Medication was already refilled on 07-16-10

## 2010-07-29 ENCOUNTER — Encounter: Payer: Self-pay | Admitting: Family Medicine

## 2010-07-29 ENCOUNTER — Encounter: Payer: Self-pay | Admitting: Cardiology

## 2010-07-29 ENCOUNTER — Ambulatory Visit (INDEPENDENT_AMBULATORY_CARE_PROVIDER_SITE_OTHER): Payer: Medicare Other | Admitting: Family Medicine

## 2010-07-29 VITALS — BP 126/76 | HR 83 | Temp 97.8°F | Ht 60.75 in | Wt 152.8 lb

## 2010-07-29 DIAGNOSIS — I872 Venous insufficiency (chronic) (peripheral): Secondary | ICD-10-CM

## 2010-07-29 DIAGNOSIS — S46019A Strain of muscle(s) and tendon(s) of the rotator cuff of unspecified shoulder, initial encounter: Secondary | ICD-10-CM | POA: Insufficient documentation

## 2010-07-29 DIAGNOSIS — J449 Chronic obstructive pulmonary disease, unspecified: Secondary | ICD-10-CM

## 2010-07-29 DIAGNOSIS — E039 Hypothyroidism, unspecified: Secondary | ICD-10-CM

## 2010-07-29 DIAGNOSIS — M545 Low back pain, unspecified: Secondary | ICD-10-CM

## 2010-07-29 DIAGNOSIS — R079 Chest pain, unspecified: Secondary | ICD-10-CM

## 2010-07-29 DIAGNOSIS — R609 Edema, unspecified: Secondary | ICD-10-CM

## 2010-07-29 DIAGNOSIS — S43429A Sprain of unspecified rotator cuff capsule, initial encounter: Secondary | ICD-10-CM

## 2010-07-29 DIAGNOSIS — J984 Other disorders of lung: Secondary | ICD-10-CM

## 2010-07-29 LAB — POCT URINALYSIS DIPSTICK
Bilirubin, UA: NEGATIVE
Blood, UA: NEGATIVE
Glucose, UA: NEGATIVE
Ketones, UA: NEGATIVE
Nitrite, UA: NEGATIVE
Protein, UA: NEGATIVE
Spec Grav, UA: <=1
Urobilinogen, UA: 0.2
pH, UA: 5.5

## 2010-07-29 NOTE — Assessment & Plan Note (Signed)
CT chest Dec. 2010 and June 2011 stable.  Cardiac CT done 06/2010 showed NO PULM NODULE. Keep routine f/u with Dr. Craige Cotta. Cont current med regimen + hs and prn O2 use. I think the looser stools she is having is a side effect of the daliresp.  She is tolerating this fine and has not had any COPD flares since being on the med.

## 2010-07-29 NOTE — Assessment & Plan Note (Signed)
Discussed options, offered subacromial steroid injection today but pt declined for now. Gentle ROM exercises demonstrated, ice hs, return prn.

## 2010-07-29 NOTE — Assessment & Plan Note (Signed)
Suspect osteoarthritic pain. Check plain film due to spinous process/focal TTP. Cont. Current pain med/f/u with Dr. Vear Clock.

## 2010-07-29 NOTE — Assessment & Plan Note (Signed)
Atypical.  No further recurrences since 03/2010. Cardiac CT done per Dr. Jens Som, has f/u with him in 1 wk.

## 2010-07-29 NOTE — Assessment & Plan Note (Signed)
Discussed dx with pt. Low sodium intake discussed, elevation of LEs prn. No further tx recommended at this time. UA today showed no protein (as did a UA in 03/2010).

## 2010-07-29 NOTE — Assessment & Plan Note (Signed)
Problem stable.  Continue current medications and diet appropriate for this condition.  We have reviewed our general long term plan for this problem and also reviewed symptoms and signs that should prompt the patient to call or return to the office.  

## 2010-07-29 NOTE — Progress Notes (Signed)
OFFICE VISIT  07/29/2010   CC:  Chief Complaint  Patient presents with  . Edema    ankles swollen and discusse thyroid therapy     HPI:    Patient is a 75 y.o. Caucasian female who presents for back pain, shoulder pain, and swelling in legs. Reviewed interval hx since 03/2010:   1) Having soft/mildly loose bm after most meals since starting daliresp. No abd pain or GI upset.  She has had NO acute COPD flare since starting daliresp 4+ mo ago. 2) Cardiac CT done 06/05/10 as part of PROMISE study: only mild obstructive plaque noted in [place] and LAD.  Has f/u appt scheduled with Dr. Jens Som 08/06/10.   No episodes of CP. 3) Has seen eye MD lately w/ c/o bilat lid drooping, some stringy mucous collection in a.m.  Concern for myasthenia gravis so ach ab testing done and also striated mm ab testing done---both neg.  Thyroid testing repeated --all normal. Has f/u appt scheduled (moisturizing drops not helping any per pt) with optho. 4) Has seen Dr. Vear Clock in pain mgmt recently b/c of worsened low back pain--central lumbar region.  Has hx of spinal stenosis and she was hoping he could inject it.  He did not inject it.  She has f/u appt with him.  She takes only occasional 1/2 oxycodone tab when the pain keeps her from sleeping.  She tolerates the pain during the day with fairly irregular dosing of OTC pain med. 5) Right shoulder hurting x 2 wks, worse at night when lying on it, worse with overhead arm lifting, started with upper arm pain.  No paresthesias or pain radiating down the arm.  No weakness.  Some trapezius area soreness yesterday but no neck pain.  No injury to arm or trauma. 6) both lower legs with swelling x 2 wks, not noted by pt to be a problem in the past.  No redness or pain.  Past Medical History  Diagnosis Date  . Hypertension     EKG 03/2010 normal  . COPD (chronic obstructive pulmonary disease)     spirometry 01/10/09 FEV 0.97(52%), FEV1% 47  . Hypoxemia     O2 86% RA with  exertion 01/10/09, 2 liters oxygen with exertion and sleep  . Pulmonary nodule     CT chest 12/10, June 2011.  No nodule on CT 06/2010.  Marland Kitchen Spinal stenosis     Symptomatic (neurogenic claudication)  . GERD (gastroesophageal reflux disease)   . Hypothyroidism     Hashimoto's, dx'd 1989  . Depression   . Insomnia   . Plantar fasciitis   . Normal electrocardiogram     03/2010 and 05/2010  . Diverticulosis of colon     Procto '97 and colonoscopy 2002  . DDD (degenerative disc disease), cervical     1998 MRI C5-6 impingemt (left)  . DDD (degenerative disc disease), lumbar   . Spinal stenosis of lumbar region with neurogenic claudication     2008 MRI (L3-4, L4-5, L5-S1)  . Fibrocystic breast disease     w/fibroadenoma.  Bx's showed NO atypia (Dr. Jamey Ripa).  Mammo neg 2008.    Past Surgical History  Procedure Date  . Cholecystectomy 2001  . Tubal ligation 1974  . Abdominal hysterectomy 1978    no BSO per pt--nonmalignant reasons  . Breast cyst excision   . Appendectomy   . Tonsillectomy     Outpatient Prescriptions Prior to Visit  Medication Sig Dispense Refill  . albuterol (PROAIR HFA) 108 (90  BASE) MCG/ACT inhaler Inhale 2 puffs into the lungs every 4 (four) hours as needed.        Marland Kitchen amLODipine (NORVASC) 5 MG tablet Take 1 tablet (5 mg total) by mouth daily.  30 tablet  2  . diazepam (VALIUM) 5 MG tablet Take 1 tablet (5 mg total) by mouth at bedtime.  30 tablet  5  . Fluticasone-Salmeterol (ADVAIR DISKUS) 250-50 MCG/DOSE AEPB Inhale 1 puff into the lungs 2 (two) times daily.  60 each  1  . levothyroxine (SYNTHROID, LEVOTHROID) 88 MCG tablet Take 88 mcg by mouth daily.        Marland Kitchen loratadine (CLARITIN) 10 MG tablet Take 10 mg by mouth as needed.        Marland Kitchen losartan (COZAAR) 100 MG tablet Take 100 mg by mouth daily.        . mometasone (NASONEX) 50 MCG/ACT nasal spray 2 sprays by Nasal route daily.        Marland Kitchen omeprazole (PRILOSEC) 20 MG capsule Take 20 mg by mouth daily.        Marland Kitchen  oxyCODONE-acetaminophen (PERCOCET) 5-325 MG per tablet Take 1 tablet by mouth every 6 (six) hours as needed.        . roflumilast (DALIRESP) 500 MCG TABS tablet Take 1 tablet (500 mcg total) by mouth daily.  30 tablet  5  . tiotropium (SPIRIVA HANDIHALER) 18 MCG inhalation capsule Place 1 capsule (18 mcg total) into inhaler and inhale daily.  30 capsule  6  . venlafaxine (EFFEXOR-XR) 37.5 MG 24 hr capsule Take 37.5 mg by mouth daily.          Allergies  Allergen Reactions  . Ace Inhibitors     REACTION: cough  . Cefixime (Suprax) Other (See Comments)    unspecified  . Penicillins     REACTION: rash, hives, swelling, welts  . Sulfa Antibiotics Other (See Comments)    unspecified    ROS As per HPI  PE: Blood pressure 126/76, pulse 83, temperature 97.8 F (36.6 C), temperature source Oral, height 5' 0.75" (1.543 m), weight 152 lb 12.8 oz (69.31 kg), SpO2 91.00%. Gen: Alert, well appearing.  Patient is oriented to person, place, time, and situation. HEENT: Scalp without lesions or hair loss.  Ears: EACs clear, normal epithelium.  TMs with good light reflex and landmarks bilaterally.  Eyes: no injection, icteris, swelling, or exudate.  EOMI, PERRLA. Nose: no drainage or turbinate edema/swelling.  No injection or focal lesion.  Mouth: lips without lesion/swelling.  Oral mucosa pink and moist.  Dentition intact and without obvious caries or gingival swelling.  Oropharynx without erythema, exudate, or swelling.  Neck: nontender.  ROM intact without stiffness or pain. Chest: symmetric expansion, nonlabored respirations.  Clear and equal breath sounds in all lung fields.   CV: RRR, no m/r/g.  Peripheral pulses 2+ and symmetric. EXT: 1 + pitting edema from mid tibia level down into ankles.  Multiple nonerythematous and nontender varicose veins.  No skin breakdown. Shoulders: right shoulder with mild TTP under acromion.  No AC joint TTP.  Mild discomfort with abduction, minimal disc with ER and  IR.  Negative drop sign testing.  No UE weakness.  Triceps/biceps/brachioradialis reflexes 2+ bilat.   BACK: mild TTP over spinous processes of lower lumbar region.  No paraspinous tenderness.  Mild pain with L/S spine ROM.  No LE weakness.  LABS:  CC UA today normal.  IMPRESSION AND PLAN:  Low back pain Suspect osteoarthritic pain. Check plain film  due to spinous process/focal TTP. Cont. Current pain med/f/u with Dr. Vear Clock.  Rotator cuff strain Discussed options, offered subacromial steroid injection today but pt declined for now. Gentle ROM exercises demonstrated, ice hs, return prn.  Chronic venous insufficiency Discussed dx with pt. Low sodium intake discussed, elevation of LEs prn. No further tx recommended at this time. UA today showed no protein (as did a UA in 03/2010).  Chest pain Atypical.  No further recurrences since 03/2010. Cardiac CT done per Dr. Jens Som, has f/u with him in 1 wk.  COPD Problem stable.  Continue current medications and diet appropriate for this condition.  We have reviewed our general long term plan for this problem and also reviewed symptoms and signs that should prompt the patient to call or return to the office.   HYPOTHYROIDISM Problem stable.  Continue current medications and diet appropriate for this condition.  We have reviewed our general long term plan for this problem and also reviewed symptoms and signs that should prompt the patient to call or return to the office. Lab Results  Component Value Date   TSH 1.883 07/11/2010     PULMONARY NODULE CT chest Dec. 2010 and June 2011 stable.  Cardiac CT done 06/2010 showed NO PULM NODULE. Keep routine f/u with Dr. Craige Cotta. Cont current med regimen + hs and prn O2 use. I think the looser stools she is having is a side effect of the daliresp.  She is tolerating this fine and has not had any COPD flares since being on the med.     Eye complaints/? Ptosis: w/u for myas grav neg. Suspect dry  eye syndrome.  She'll keep her f/u with her ophthalmologist.  FOLLOW UP: Return in about 4 months (around 11/28/2010) for f/u COPD, hypoth, HTN.

## 2010-07-29 NOTE — Assessment & Plan Note (Signed)
Problem stable.  Continue current medications and diet appropriate for this condition.  We have reviewed our general long term plan for this problem and also reviewed symptoms and signs that should prompt the patient to call or return to the office. Lab Results  Component Value Date   TSH 1.883 07/11/2010

## 2010-08-06 ENCOUNTER — Ambulatory Visit: Payer: Medicare Other | Admitting: Cardiology

## 2010-08-08 ENCOUNTER — Telehealth: Payer: Self-pay | Admitting: Family Medicine

## 2010-08-08 ENCOUNTER — Other Ambulatory Visit: Payer: Self-pay | Admitting: Family Medicine

## 2010-08-08 ENCOUNTER — Ambulatory Visit (HOSPITAL_BASED_OUTPATIENT_CLINIC_OR_DEPARTMENT_OTHER)
Admission: RE | Admit: 2010-08-08 | Discharge: 2010-08-08 | Disposition: A | Discharge status: Home / Self Care | Payer: Medicare Other | Source: Ambulatory Visit | Attending: Family Medicine | Admitting: Family Medicine

## 2010-08-08 DIAGNOSIS — M519 Unspecified thoracic, thoracolumbar and lumbosacral intervertebral disc disorder: Secondary | ICD-10-CM | POA: Insufficient documentation

## 2010-08-08 DIAGNOSIS — M47817 Spondylosis without myelopathy or radiculopathy, lumbosacral region: Secondary | ICD-10-CM

## 2010-08-08 DIAGNOSIS — M545 Low back pain, unspecified: Secondary | ICD-10-CM

## 2010-08-08 NOTE — Telephone Encounter (Signed)
Discussed x-ray results: lumbar spondylosis, no fracture or listhesis. She'll try increased exercise at the [facility] and we'll defer PT at this time.

## 2010-08-23 ENCOUNTER — Other Ambulatory Visit: Payer: Self-pay | Admitting: *Deleted

## 2010-08-23 DIAGNOSIS — I10 Essential (primary) hypertension: Secondary | ICD-10-CM

## 2010-08-23 MED ORDER — LOSARTAN POTASSIUM 100 MG PO TABS: 100.0000 mg | ORAL_TABLET | Freq: Every day | ORAL | Status: DC

## 2010-08-23 MED ORDER — VENLAFAXINE HCL ER 37.5 MG PO CP24: 37.5000 mg | ORAL_CAPSULE | Freq: Every day | ORAL | Status: DC

## 2010-08-23 NOTE — Telephone Encounter (Signed)
Last seen 07/29/10, follow up 11/28/10.  RF sent until that time.

## 2010-08-26 ENCOUNTER — Encounter: Payer: Self-pay | Admitting: Cardiology

## 2010-08-26 ENCOUNTER — Ambulatory Visit (INDEPENDENT_AMBULATORY_CARE_PROVIDER_SITE_OTHER): Payer: Medicare Other | Admitting: Cardiology

## 2010-08-26 VITALS — BP 121/75 | HR 72 | Resp 12 | Ht 61.0 in | Wt 154.0 lb

## 2010-08-26 DIAGNOSIS — R079 Chest pain, unspecified: Secondary | ICD-10-CM

## 2010-08-26 DIAGNOSIS — I10 Essential (primary) hypertension: Secondary | ICD-10-CM

## 2010-08-26 DIAGNOSIS — R072 Precordial pain: Secondary | ICD-10-CM

## 2010-08-26 LAB — LIPID PANEL
Cholesterol: 172 mg/dL (ref 0–200)
HDL: 52.3 mg/dL (ref >39.00)
LDL Cholesterol: 94 mg/dL (ref 0–99)
Total CHOL/HDL Ratio: 3
Triglycerides: 130 mg/dL (ref 0.0–149.0)
VLDL: 26 mg/dL (ref 0.0–40.0)

## 2010-08-26 MED ORDER — ASPIRIN EC 81 MG PO TBEC: 81.0000 mg | DELAYED_RELEASE_TABLET | Freq: Every day | ORAL | Status: AC

## 2010-08-26 NOTE — Assessment & Plan Note (Addendum)
Blood pressure controlled. Continue present medications. 

## 2010-08-26 NOTE — Progress Notes (Signed)
HPI:75 yo female I initially saw in May of 2012 for evaluation of chest pain. [place] CTA performed as part of [facility] study; nonobstructive plaque; calcium score of 69; emphysema noted. Since then, she does have dyspnea on exertion but improved. No orthopnea, PND, pedal edema or syncope. Her chest pain has improved. She occasionally feels a substernal sensation described as a shooting pain or burning pain. It is not exertional. It is not pleuritic or positional. Her most recent episode improved with Gas-X.  Current Outpatient Prescriptions  Medication Sig Dispense Refill  . albuterol (PROAIR HFA) 108 (90 BASE) MCG/ACT inhaler Inhale 2 puffs into the lungs every 4 (four) hours as needed.        Marland Kitchen amLODipine (NORVASC) 5 MG tablet Take 1 tablet (5 mg total) by mouth daily.  30 tablet  2  . Fluticasone-Salmeterol (ADVAIR DISKUS) 250-50 MCG/DOSE AEPB Inhale 1 puff into the lungs 2 (two) times daily.  60 each  1  . levothyroxine (SYNTHROID, LEVOTHROID) 88 MCG tablet Take 88 mcg by mouth daily.        Marland Kitchen loratadine (CLARITIN) 10 MG tablet Take 10 mg by mouth as needed.        Marland Kitchen losartan (COZAAR) 100 MG tablet Take 1 tablet (100 mg total) by mouth daily.  30 tablet  3  . mometasone (NASONEX) 50 MCG/ACT nasal spray 2 sprays by Nasal route daily.        Marland Kitchen omeprazole (PRILOSEC) 20 MG capsule Take 20 mg by mouth daily.        Marland Kitchen oxyCODONE-acetaminophen (PERCOCET) 5-325 MG per tablet Take 1 tablet by mouth every 6 (six) hours as needed.        . roflumilast (DALIRESP) 500 MCG TABS tablet Take 1 tablet (500 mcg total) by mouth daily.  30 tablet  5  . tiotropium (SPIRIVA HANDIHALER) 18 MCG inhalation capsule Place 1 capsule (18 mcg total) into inhaler and inhale daily.  30 capsule  6  . venlafaxine (EFFEXOR-XR) 37.5 MG 24 hr capsule Take 1 capsule (37.5 mg total) by mouth daily.  30 capsule  3  . aspirin EC 81 MG tablet Take 1 tablet (81 mg total) by mouth daily.  150 tablet  2  . diazepam (VALIUM) 5 MG tablet Take  1 tablet (5 mg total) by mouth at bedtime.  30 tablet  5     Past Medical History  Diagnosis Date  . Hypertension     EKG 03/2010 normal  . COPD (chronic obstructive pulmonary disease)     spirometry 01/10/09 FEV 0.97(52%), FEV1% 47  . Hypoxemia     O2 86% RA with exertion 01/10/09, 2 liters oxygen with exertion and sleep  . Pulmonary nodule     CT chest 12/10, June 2011.  No nodule on CT 06/2010.  Marland Kitchen Spinal stenosis     Symptomatic (neurogenic claudication)  . GERD (gastroesophageal reflux disease)   . Hypothyroidism     Hashimoto's, dx'd 1989  . Depression   . Insomnia   . Plantar fasciitis   . Normal electrocardiogram     03/2010 and 05/2010  . Diverticulosis of colon     Procto '97 and colonoscopy 2002  . DDD (degenerative disc disease), cervical     1998 MRI C5-6 impingemt (left)  . DDD (degenerative disc disease), lumbar   . Spinal stenosis of lumbar region with neurogenic claudication     2008 MRI (L3-4, L4-5, L5-S1)  . Fibrocystic breast disease     w/fibroadenoma.  Bx's showed NO atypia (Dr. Jamey Ripa).  Mammo neg 2008.    Past Surgical History  Procedure Date  . Cholecystectomy 2001  . Tubal ligation 1974  . Abdominal hysterectomy 1978    no BSO per pt--nonmalignant reasons  . Breast cyst excision   . Appendectomy   . Tonsillectomy     History   Social History  . Marital Status: Married    Spouse Name: N/A    Number of Children: 1  . Years of Education: N/A   Occupational History  . SECRETARY    Social History Main Topics  . Smoking status: Former Smoker -- 2.0 packs/day for 35 years    Types: Cigarettes    Quit date: 02/03/1990  . Smokeless tobacco: Never Used  . Alcohol Use: Yes     Rarely  . Drug Use: No  . Sexually Active: No   Other Topics Concern  . Not on file   Social History Narrative   Married, lives in [place], [place].  Works as Radiographer, therapeutic.      ROS: no fevers or chills, productive cough, hemoptysis, dysphasia, odynophagia,  melena, hematochezia, dysuria, hematuria, rash, seizure activity, orthopnea, PND, pedal edema, claudication. Remaining systems are negative.  Physical Exam: Well-developed well-nourished in no acute distress.  Skin is warm and dry.  HEENT is normal.  Neck is supple. No thyromegaly.  Chest is clear to auscultation with normal expansion.  Cardiovascular exam is regular rate and rhythm.  Abdominal exam nontender or distended. No masses palpated. Extremities show no edema. neuro grossly intact  ECG normal sinus rhythm at rate of 72. No ST changes.

## 2010-08-26 NOTE — Assessment & Plan Note (Signed)
Symptoms atypical. Chest CT showed no obstructive disease. She did have a mildly elevated calcium score. Plan lifestyle modification including diet and exercise. She does not smoke. Add enteric-coated aspirin 81 mg daily. Check lipids and LDL elevated add statin.

## 2010-08-26 NOTE — Patient Instructions (Signed)
Start Aspirin 81 mg daily  Lab today  Your physician wants you to follow-up in: 6 months.  You will receive a reminder letter in the mail two months in advance. If you don't receive a letter, please call our office to schedule the follow-up appointment.

## 2010-08-27 ENCOUNTER — Encounter: Payer: Self-pay | Admitting: Family Medicine

## 2010-08-28 ENCOUNTER — Encounter: Payer: Self-pay | Admitting: *Deleted

## 2010-09-16 ENCOUNTER — Telehealth: Payer: Self-pay | Admitting: Pulmonary Disease

## 2010-09-16 NOTE — Telephone Encounter (Signed)
Pt is aware VS schedule is full on 8/16 and she is fine with this. Pt says she is having no problems and has made an appt on 9/19 with VS and will just plan to come then or call if having trouble before then.

## 2010-09-17 ENCOUNTER — Other Ambulatory Visit: Payer: Self-pay | Admitting: *Deleted

## 2010-09-17 MED ORDER — ALBUTEROL SULFATE HFA 108 (90 BASE) MCG/ACT IN AERS: 2.0000 | INHALATION_SPRAY | RESPIRATORY_TRACT | Status: DC | PRN

## 2010-10-03 ENCOUNTER — Ambulatory Visit (INDEPENDENT_AMBULATORY_CARE_PROVIDER_SITE_OTHER): Payer: Medicare Other | Admitting: Family Medicine

## 2010-10-03 ENCOUNTER — Encounter: Payer: Self-pay | Admitting: Family Medicine

## 2010-10-03 VITALS — BP 117/67 | HR 76 | Temp 97.4°F | Ht 60.75 in | Wt 147.5 lb

## 2010-10-03 DIAGNOSIS — S43429A Sprain of unspecified rotator cuff capsule, initial encounter: Secondary | ICD-10-CM

## 2010-10-03 DIAGNOSIS — S46019A Strain of muscle(s) and tendon(s) of the rotator cuff of unspecified shoulder, initial encounter: Secondary | ICD-10-CM

## 2010-10-04 NOTE — Assessment & Plan Note (Signed)
Recurrent.  No sign of tear on exam. Since her exacerbation of this problem just started yesterday, and conservative measures (REST/ICE/NSAIDs) helped resolve this problem in the recent past, we chose to give this some time again.  She'll call next week if she is not better and we can do injection at that time if appropriate still.

## 2010-10-04 NOTE — Progress Notes (Signed)
OFFICE NOTE  10/04/2010  CC:  Chief Complaint  Patient presents with  . Shoulder Pain    right shoulder/ injection     HPI:   Patient is a 75 y.o. Caucasian female who is here for f/u right shoulder pain. Onset 2 mo ago, seen here by me and hx/exam consistent with rotator cuff strain.  Conservative measures were undertaken and she improved to the point of the shoulder feeling completely normal again until yesterday when she tossed her purse into her car in a way that strained the same area again.  Mild pain in the area of the right trapezius as well, but no neck pain.  Feels most of the pain over acromion/deltoid area.  Mild at rest, worse with abduction and ER.  No weakness or paresthesias or radiation of the pain into arm/hand.    Pertinent PMH:  No prior shoulder injury or surgery.   +Cervical DDD  MEDS;   Outpatient Prescriptions Prior to Visit  Medication Sig Dispense Refill  . albuterol (PROAIR HFA) 108 (90 BASE) MCG/ACT inhaler Inhale 2 puffs into the lungs every 4 (four) hours as needed.  1 Inhaler  5  . amLODipine (NORVASC) 5 MG tablet Take 1 tablet (5 mg total) by mouth daily.  30 tablet  2  . aspirin EC 81 MG tablet Take 1 tablet (81 mg total) by mouth daily.  150 tablet  2  . diazepam (VALIUM) 5 MG tablet Take 1 tablet (5 mg total) by mouth at bedtime.  30 tablet  5  . Fluticasone-Salmeterol (ADVAIR DISKUS) 250-50 MCG/DOSE AEPB Inhale 1 puff into the lungs 2 (two) times daily.  60 each  1  . levothyroxine (SYNTHROID, LEVOTHROID) 88 MCG tablet Take 88 mcg by mouth daily.        Marland Kitchen loratadine (CLARITIN) 10 MG tablet Take 10 mg by mouth as needed.        Marland Kitchen losartan (COZAAR) 100 MG tablet Take 1 tablet (100 mg total) by mouth daily.  30 tablet  3  . mometasone (NASONEX) 50 MCG/ACT nasal spray 2 sprays by Nasal route daily.        Marland Kitchen omeprazole (PRILOSEC) 20 MG capsule Take 20 mg by mouth daily.        Marland Kitchen oxyCODONE-acetaminophen (PERCOCET) 5-325 MG per tablet Take 1 tablet by  mouth every 6 (six) hours as needed.        . roflumilast (DALIRESP) 500 MCG TABS tablet Take 1 tablet (500 mcg total) by mouth daily.  30 tablet  5  . tiotropium (SPIRIVA HANDIHALER) 18 MCG inhalation capsule Place 1 capsule (18 mcg total) into inhaler and inhale daily.  30 capsule  6  . venlafaxine (EFFEXOR-XR) 37.5 MG 24 hr capsule Take 1 capsule (37.5 mg total) by mouth daily.  30 capsule  3    PE: Blood pressure 117/67, pulse 76, temperature 97.4 F (36.3 C), temperature source Oral, height 5' 0.75" (1.543 m), weight 147 lb 8 oz (66.906 kg), SpO2 90.00%. Gen: Alert, well appearing.  Patient is oriented to person, place, time, and situation. Neck: nontender, ROM fully intact. Right shoulder mildly TTP in right acromion area and right trapezius diffusely.  Abduction and ER limited by pain. No AC joint tenderness.   No weakness, no sensory deficits, DTRs intact and symmetric.  IMPRESSION AND PLAN:  Rotator cuff strain Recurrent.  No sign of tear on exam. Since her exacerbation of this problem just started yesterday, and conservative measures (REST/ICE/NSAIDs) helped resolve this problem  in the recent past, we chose to give this some time again.  She'll call next week if she is not better and we can do injection at that time if appropriate still.     FOLLOW UP:  Return if symptoms worsen or fail to improve.

## 2010-10-08 ENCOUNTER — Other Ambulatory Visit: Payer: Self-pay | Admitting: *Deleted

## 2010-10-08 DIAGNOSIS — I10 Essential (primary) hypertension: Secondary | ICD-10-CM

## 2010-10-08 MED ORDER — AMLODIPINE BESYLATE 5 MG PO TABS: 5.0000 mg | ORAL_TABLET | Freq: Every day | ORAL | Status: DC

## 2010-10-08 NOTE — Telephone Encounter (Signed)
Faxed refill request received from pharmacy.  Last seen on 07/29/10, follow up scheduled on 11/28/10.  Refill sent until that time.

## 2010-10-23 ENCOUNTER — Ambulatory Visit: Payer: Medicare Other | Admitting: Pulmonary Disease

## 2010-11-04 ENCOUNTER — Ambulatory Visit (INDEPENDENT_AMBULATORY_CARE_PROVIDER_SITE_OTHER): Payer: Medicare Other | Admitting: *Deleted

## 2010-11-04 DIAGNOSIS — Z23 Encounter for immunization: Secondary | ICD-10-CM

## 2010-11-13 ENCOUNTER — Encounter: Payer: Self-pay | Admitting: Surgery

## 2010-11-28 ENCOUNTER — Ambulatory Visit (INDEPENDENT_AMBULATORY_CARE_PROVIDER_SITE_OTHER): Payer: Medicare Other | Admitting: Family Medicine

## 2010-11-28 ENCOUNTER — Encounter: Payer: Self-pay | Admitting: Family Medicine

## 2010-11-28 VITALS — BP 109/69 | HR 70 | Wt 146.0 lb

## 2010-11-28 DIAGNOSIS — I10 Essential (primary) hypertension: Secondary | ICD-10-CM

## 2010-11-28 DIAGNOSIS — Z23 Encounter for immunization: Secondary | ICD-10-CM

## 2010-11-28 DIAGNOSIS — R079 Chest pain, unspecified: Secondary | ICD-10-CM

## 2010-11-28 DIAGNOSIS — S43429A Sprain of unspecified rotator cuff capsule, initial encounter: Secondary | ICD-10-CM

## 2010-11-28 DIAGNOSIS — J449 Chronic obstructive pulmonary disease, unspecified: Secondary | ICD-10-CM

## 2010-11-28 DIAGNOSIS — S46019A Strain of muscle(s) and tendon(s) of the rotator cuff of unspecified shoulder, initial encounter: Secondary | ICD-10-CM

## 2010-11-28 DIAGNOSIS — E039 Hypothyroidism, unspecified: Secondary | ICD-10-CM

## 2010-11-28 NOTE — Progress Notes (Signed)
OFFICE NOTE  12/01/2010  CC:  Chief Complaint  Patient presents with  . Follow-up     HPI:   Patient is a 75 y.o. Caucasian female who is here for routine f/u for hypothyroidism, COPD, HTN, and atypical chest pain. Says she is doing well.  Even the shoulder pain she was having last visit (mild rotator cuff tendonopathy) is much improved.  She's doing some resistance ROM exercises and has found this very helpful. Has only rare chest discomfort, mainly sharp or burning, not exertional, not associated with any other symptoms. She saw Dr. Jens Som in cardiology, w/u low risk. Says breathing is fine, has less cough and wheeze and uses less albuterol (2-3 times per month!) since getting started on daliresp 6 months ago.  She is compliant with all meds, denies side effects. She does some leisurely walking occasionally but no exercise otherwise. She got her mammogram 10/2010 and this was normal.  Pertinent PMH:  COPD, nocturnal O2 supplement HTN Hypothyroidism GERD Spinal stenosis, with neurogenic claudication  MEDS;   Outpatient Prescriptions Prior to Visit  Medication Sig Dispense Refill  . albuterol (PROAIR HFA) 108 (90 BASE) MCG/ACT inhaler Inhale 2 puffs into the lungs every 4 (four) hours as needed.  1 Inhaler  5  . amLODipine (NORVASC) 5 MG tablet Take 1 tablet (5 mg total) by mouth daily.  30 tablet  1  . aspirin EC 81 MG tablet Take 1 tablet (81 mg total) by mouth daily.  150 tablet  2  . diazepam (VALIUM) 5 MG tablet Take 1 tablet (5 mg total) by mouth at bedtime.  30 tablet  5  . Fluticasone-Salmeterol (ADVAIR DISKUS) 250-50 MCG/DOSE AEPB Inhale 1 puff into the lungs 2 (two) times daily.  60 each  1  . levothyroxine (SYNTHROID, LEVOTHROID) 88 MCG tablet Take 88 mcg by mouth daily.        Marland Kitchen loratadine (CLARITIN) 10 MG tablet Take 10 mg by mouth as needed.        Marland Kitchen losartan (COZAAR) 100 MG tablet Take 1 tablet (100 mg total) by mouth daily.  30 tablet  3  . mometasone  (NASONEX) 50 MCG/ACT nasal spray 2 sprays by Nasal route daily.        Marland Kitchen omeprazole (PRILOSEC) 20 MG capsule Take 20 mg by mouth daily.        Marland Kitchen oxyCODONE-acetaminophen (PERCOCET) 5-325 MG per tablet Take 1 tablet by mouth every 6 (six) hours as needed.        . roflumilast (DALIRESP) 500 MCG TABS tablet Take 1 tablet (500 mcg total) by mouth daily.  30 tablet  5  . tiotropium (SPIRIVA HANDIHALER) 18 MCG inhalation capsule Place 1 capsule (18 mcg total) into inhaler and inhale daily.  30 capsule  6  . venlafaxine (EFFEXOR-XR) 37.5 MG 24 hr capsule Take 1 capsule (37.5 mg total) by mouth daily.  30 capsule  3    PE: Blood pressure 109/69, pulse 70, weight 146 lb (66.225 kg), SpO2 94.00%. Gen: Alert, well appearing.  Patient is oriented to person, place, time, and situation. No further exam today.  IMPRESSION AND PLAN:  COPD Problem stable.  Clinically improved since getting on daliresp. Continue current medications and diet appropriate for this condition.  We have reviewed our general long term plan for this problem and also reviewed symptoms and signs that should prompt the patient to call or return to the office.   ESSENTIAL HYPERTENSION Problem stable.  Continue current medications and diet appropriate  for this condition.  We have reviewed our general long term plan for this problem and also reviewed symptoms and signs that should prompt the patient to call or return to the office.   Rotator cuff strain Much improved. Continue resistance ROM exercises.  HYPOTHYROIDISM Lab Results  Component Value Date   TSH 1.883 07/11/2010   Problem stable.  Continue current medications and diet appropriate for this condition.  We have reviewed our general long term plan for this problem and also reviewed symptoms and signs that should prompt the patient to call or return to the office. Recheck TSH at next f/u in 35mo.   Chest pain Doing well. Atypical in nature.   Cardiac CT showed no  obstructive plaque, coronary calcium score slightly high. She had recent f/u with Dr. Jens Som and he recommended starting 81mg  qd ASA and check lipids--start statin if LDL up.  Chol panel at that time was good (LDL 94), so no statin was added.  Of note, she says she was not going to start a statin anyway b/c of various fears/beliefs about these meds based on past experiences of relatives/friends.  Nevertheless, it is not an issue at this time.  She is not clear about f/u plan with Dr. Jens Som (vs [facility]) b/c she is involved in the [facility] study. We'll contact Dr. Ludwig Clarks office to try to clear this up for her.     FOLLOW UP:  Return in about 4 months (around 03/31/2011) for f/u hypothyroidism, copd, and HTN.

## 2010-12-01 NOTE — Assessment & Plan Note (Signed)
Much improved. Continue resistance ROM exercises.

## 2010-12-01 NOTE — Assessment & Plan Note (Addendum)
Doing well. Atypical in nature.   Cardiac CT showed no obstructive plaque, coronary calcium score slightly high. She had recent f/u with Dr. Jens Som and he recommended starting 81mg  qd ASA and check lipids--start statin if LDL up.  Chol panel at that time was good (LDL 94), so no statin was added.  Of note, she says she was not going to start a statin anyway b/c of various fears/beliefs about these meds based on past experiences of relatives/friends.  Nevertheless, it is not an issue at this time.  She is not clear about f/u plan with Dr. Jens Som (vs [facility]) b/c she is involved in the [facility] study. We'll contact Dr. Ludwig Clarks office to try to clear this up for her.

## 2010-12-01 NOTE — Assessment & Plan Note (Signed)
Problem stable.  Clinically improved since getting on daliresp. Continue current medications and diet appropriate for this condition.  We have reviewed our general long term plan for this problem and also reviewed symptoms and signs that should prompt the patient to call or return to the office.

## 2010-12-01 NOTE — Assessment & Plan Note (Signed)
Lab Results  Component Value Date   TSH 1.883 07/11/2010   Problem stable.  Continue current medications and diet appropriate for this condition.  We have reviewed our general long term plan for this problem and also reviewed symptoms and signs that should prompt the patient to call or return to the office. Recheck TSH at next f/u in 65mo.

## 2010-12-01 NOTE — Assessment & Plan Note (Signed)
Problem stable.  Continue current medications and diet appropriate for this condition.  We have reviewed our general long term plan for this problem and also reviewed symptoms and signs that should prompt the patient to call or return to the office.  

## 2010-12-04 ENCOUNTER — Other Ambulatory Visit: Payer: Self-pay | Admitting: *Deleted

## 2010-12-04 DIAGNOSIS — I10 Essential (primary) hypertension: Secondary | ICD-10-CM

## 2010-12-04 MED ORDER — AMLODIPINE BESYLATE 5 MG PO TABS: 5.0000 mg | ORAL_TABLET | Freq: Every day | ORAL | Status: DC

## 2010-12-04 NOTE — Telephone Encounter (Signed)
Pt seen on 10/25, 6 month refill given.  Pt has follow up appt on 03/31/11

## 2010-12-16 ENCOUNTER — Other Ambulatory Visit: Payer: Self-pay | Admitting: *Deleted

## 2010-12-16 DIAGNOSIS — I10 Essential (primary) hypertension: Secondary | ICD-10-CM

## 2010-12-16 MED ORDER — LOSARTAN POTASSIUM 100 MG PO TABS: 100.0000 mg | ORAL_TABLET | Freq: Every day | ORAL | Status: DC

## 2010-12-16 MED ORDER — VENLAFAXINE HCL ER 37.5 MG PO CP24: 37.5000 mg | ORAL_CAPSULE | Freq: Every day | ORAL | Status: DC

## 2010-12-16 NOTE — Telephone Encounter (Signed)
Last seen 10/25, follow up on 03/31/11.  RX sent.

## 2011-01-07 ENCOUNTER — Encounter: Payer: Self-pay | Admitting: Emergency Medicine

## 2011-01-07 ENCOUNTER — Telehealth: Payer: Self-pay | Admitting: Pulmonary Disease

## 2011-01-07 ENCOUNTER — Ambulatory Visit (INDEPENDENT_AMBULATORY_CARE_PROVIDER_SITE_OTHER): Payer: Medicare Other | Admitting: Emergency Medicine

## 2011-01-07 VITALS — BP 90/58 | HR 88 | Temp 98.5°F | Ht 61.0 in | Wt 148.4 lb

## 2011-01-07 DIAGNOSIS — J449 Chronic obstructive pulmonary disease, unspecified: Secondary | ICD-10-CM

## 2011-01-07 MED ORDER — PREDNISONE 10 MG PO TABS: ORAL_TABLET | ORAL | Status: DC

## 2011-01-07 MED ORDER — AZITHROMYCIN 250 MG PO TABS: ORAL_TABLET | ORAL | Status: AC

## 2011-01-07 NOTE — Assessment & Plan Note (Signed)
With mild AE in setting probable URI.  - will rx with pred + azithro - continue her usual regimen - she will call if not improving - rov Dr Craige Cotta 3-4 weeks to insure resolved

## 2011-01-07 NOTE — Progress Notes (Signed)
Subjective:    Patient ID: Priscilla Cantrell, female    DOB: Jul 01, 1935, 75 y.o.   MRN: [id number] HPI 75 yo female former smoker with GOLD 4 COPD, ACE related cough, pulmonary nodule, and hypoxemia on 2 liters oxygen with exertion and sleep She has been getting pain around her chest and in between her shoulder blades.  She has noticed that this discomfort follows along where her bra strap fits.  She also had an episode of severe chest pain lasting for 3 hours in February.  This recurred about 5 weeks later.  She described it more as a burning sensation.  As a result she was referred to Dr. Jens Som with cardiology.cShe has felt lousy over the past several weeks.  She has been feeling more tired.  She was started on daliresp by primary care, and feels like this has helped.  She still has occasional cough.  She is not having much sputum or wheezing.  Acute Visit 01/07/11 -- followed by Dr Craige Cotta for GOLD 4 COPD. Last seen 06/2010. Presents today describing nasal congestion, throat soreness, fatigue and malaise x 3-4 days.  She has had some dry cough, rare thick sputum. More wheezing x 3 days.  Has needed SABA more frequently - every day. Maintanance meds are Advair + Spiriva + Daliresp.       Objective:   Physical Exam Filed Vitals:   01/07/11 1442  BP: 90/58  Pulse: 88  Temp: 98.5 F (36.9 C)  TempSrc: Oral  Height: 5\' 1"  (1.549 m)  Weight: 148 lb 6.4 oz (67.314 kg)  SpO2: 91%   General: normal appearance.  Nose: clear nasal discharge, no tenderness,  Mouth: no deformity or lesions  Neck: no JVD.  Lungs: prolonged exhalation, decreased breath sounds, B low pitched wheeze Heart: regular rate and rhythm, S1, S2 without murmurs, rubs, gallops, or clicks  Extremities: minimal ankle edema  Neurologic: normal CN II-XII.  Cervical Nodes: no significant adenopathy     Assessment & Plan:   No problem-specific assessment & plan notes found for this encounter.   Updated Medication  List Outpatient Encounter Prescriptions as of 01/07/2011  Medication Sig Dispense Refill  . albuterol (PROAIR HFA) 108 (90 BASE) MCG/ACT inhaler Inhale 2 puffs into the lungs every 4 (four) hours as needed.  1 Inhaler  5  . amLODipine (NORVASC) 5 MG tablet Take 1 tablet (5 mg total) by mouth daily.  30 tablet  5  . aspirin EC 81 MG tablet Take 1 tablet (81 mg total) by mouth daily.  150 tablet  2  . diazepam (VALIUM) 5 MG tablet Take 1 tablet (5 mg total) by mouth at bedtime.  30 tablet  5  . Fluticasone-Salmeterol (ADVAIR DISKUS) 250-50 MCG/DOSE AEPB Inhale 1 puff into the lungs 2 (two) times daily.  60 each  1  . levothyroxine (SYNTHROID, LEVOTHROID) 88 MCG tablet Take 88 mcg by mouth daily.        Marland Kitchen loratadine (CLARITIN) 10 MG tablet Take 10 mg by mouth as needed.        Marland Kitchen losartan (COZAAR) 100 MG tablet Take 1 tablet (100 mg total) by mouth daily.  30 tablet  4  . mometasone (NASONEX) 50 MCG/ACT nasal spray 2 sprays by Nasal route daily.        Marland Kitchen omeprazole (PRILOSEC) 20 MG capsule Take 20 mg by mouth daily.        Marland Kitchen oxyCODONE-acetaminophen (PERCOCET) 5-325 MG per tablet Take 1 tablet by mouth every 6 (  six) hours as needed.        . roflumilast (DALIRESP) 500 MCG TABS tablet Take 1 tablet (500 mcg total) by mouth daily.  30 tablet  5  . tiotropium (SPIRIVA HANDIHALER) 18 MCG inhalation capsule Place 1 capsule (18 mcg total) into inhaler and inhale daily.  30 capsule  6  . venlafaxine (EFFEXOR-XR) 37.5 MG 24 hr capsule Take 1 capsule (37.5 mg total) by mouth daily.  30 capsule  4    COPD With mild AE in setting probable URI.  - will rx with pred + azithro - continue her usual regimen - she will call if not improving - rov Dr Craige Cotta 3-4 weeks to insure resolved

## 2011-01-07 NOTE — Progress Notes (Signed)
Addended by: Leslye Peer on: 01/07/2011 03:04 PM   Modules accepted: Orders

## 2011-01-07 NOTE — Telephone Encounter (Signed)
I spoke with pt and she states she is wheezing bad and coughing a lot. Pt requesting to be seen today. Pt is scheduled to come in and see RB today at 2:30

## 2011-01-07 NOTE — Patient Instructions (Signed)
Please take prednisone and azithromycin as directed Continue your usual inhaled medications and Daliresp Follow with Dr Craige Cotta in 3 -4 weeks to insure that you are improved Call our office if you worsen in any way

## 2011-01-10 ENCOUNTER — Telehealth: Payer: Self-pay | Admitting: Pulmonary Disease

## 2011-01-10 MED ORDER — HYDROCODONE-HOMATROPINE 5-1.5 MG/5ML PO SYRP: 5.0000 mL | ORAL_SOLUTION | Freq: Four times a day (QID) | ORAL | Status: AC | PRN

## 2011-01-10 NOTE — Telephone Encounter (Signed)
I spoke with pt and she is wanting cough syrup called into the pharmacy. Pt states she is coughing her head off at night. Pt has tried delsym OTC and mucinex. Pt was in on 01/07/11 and saw RB and was given a zpak and pred taper. Please advise Dr. Craige Cotta, thanks  Allergies  Allergen Reactions  . Ace Inhibitors     REACTION: cough  . Cefixime (YNW:GNFAOZHY) Other (See Comments)    unspecified  . Penicillins     REACTION: rash, hives, swelling, welts  . Sulfa Antibiotics Other (See Comments)    unspecified

## 2011-01-10 NOTE — Telephone Encounter (Signed)
Hycodan called to [facility] in [facility] and the pt is aware.

## 2011-01-10 NOTE — Telephone Encounter (Signed)
Please send script for hycodan 5-1.5 mg/5 ml, Sig: 5 ml every 6 hours as needed, dispense 120 ml with no refills.

## 2011-01-15 ENCOUNTER — Telehealth: Payer: Self-pay | Admitting: Pulmonary Disease

## 2011-01-15 DIAGNOSIS — J449 Chronic obstructive pulmonary disease, unspecified: Secondary | ICD-10-CM

## 2011-01-15 MED ORDER — FLUTICASONE-SALMETEROL 250-50 MCG/DOSE IN AEPB: 1.0000 | INHALATION_SPRAY | Freq: Two times a day (BID) | RESPIRATORY_TRACT | Status: DC

## 2011-01-15 NOTE — Telephone Encounter (Signed)
Spoke with pt an is aware rx for advair 250/50 with 1 rf  [facility] [place].Pt has an appt with tammy  In Jan 2013.

## 2011-01-23 ENCOUNTER — Encounter: Payer: Self-pay | Admitting: Pulmonary Disease

## 2011-01-23 ENCOUNTER — Ambulatory Visit: Payer: Medicare Other | Admitting: Emergency Medicine

## 2011-01-23 ENCOUNTER — Ambulatory Visit (INDEPENDENT_AMBULATORY_CARE_PROVIDER_SITE_OTHER): Payer: Medicare Other | Admitting: Pulmonary Disease

## 2011-01-23 DIAGNOSIS — J449 Chronic obstructive pulmonary disease, unspecified: Secondary | ICD-10-CM

## 2011-01-23 DIAGNOSIS — R0902 Hypoxemia: Secondary | ICD-10-CM

## 2011-01-23 NOTE — Progress Notes (Signed)
Chief Complaint  Patient presents with  . Follow-up    Pt states her cough is not getting any better. Pt gets phlem up occasionally and it is yellow to clear. Pt c/o wheezing last night, unknown fever. Cough syrup does not help    History of Present Illness: Priscilla Cantrell is a 75 y.o. female former smoker with GOLD 4 COPD, ACE related cough, pulmonary nodule, and hypoxemia on 2 liters oxygen with exertion and sleep  She was treated for an exacerbation earlier this month with prednisone and zpak.  She has improved some, but still has a cough.  She gets wheezing occasionally, but less than before.  She is still using her proair several times per week.  She has some sinus congestion and cough with clear sputum.  She denies sore throat or fever.  She feels daliresp has helped her breathing.  Past Medical History  Diagnosis Date  . Hypertension     EKG 03/2010 normal  . COPD (chronic obstructive pulmonary disease)     spirometry 01/10/09 FEV 0.97(52%), FEV1% 47  . Hypoxemia     O2 86% RA with exertion 01/10/09, 2 liters oxygen with exertion and sleep  . Pulmonary nodule     CT chest 12/10, June 2011.  No nodule on CT 06/2010.  Marland Kitchen Spinal stenosis     Symptomatic (neurogenic claudication)  . GERD (gastroesophageal reflux disease)   . Hypothyroidism     Hashimoto's, dx'd 1989  . Depression   . Insomnia   . Plantar fasciitis   . Normal electrocardiogram     03/2010 and 05/2010  . Diverticulosis of colon     Procto '97 and colonoscopy 2002  . DDD (degenerative disc disease), cervical     1998 MRI C5-6 impingemt (left)  . DDD (degenerative disc disease), lumbar   . Spinal stenosis of lumbar region with neurogenic claudication     2008 MRI (L3-4, L4-5, L5-S1)  . Fibrocystic breast disease     w/fibroadenoma.  Bx's showed NO atypia (Dr. Jamey Ripa).  Mammo neg 2008.  Marland Kitchen Chest pain, non-cardiac     Cardiac CT showed no signif obstructive dz, mildly elevated calcium level (Dr. Jens Som).    Past  Surgical History  Procedure Date  . Cholecystectomy 2001  . Tubal ligation 1974  . Abdominal hysterectomy 1978    no BSO per pt--nonmalignant reasons  . Breast cyst excision     Last screening mammogram 10/2010 was normal.  . Appendectomy   . Tonsillectomy     Allergies  Allergen Reactions  . Ace Inhibitors     REACTION: cough  . Cefixime (ZOX:WRUEAVWU) Other (See Comments)    unspecified  . Penicillins     REACTION: rash, hives, swelling, welts  . Sulfa Antibiotics Other (See Comments)    unspecified    Physical Exam:  Blood pressure 118/72, pulse 73, temperature 98.6 F (37 C), temperature source Oral, height 5' 1.5" (1.562 m), weight 148 lb 6.4 oz (67.314 kg), SpO2 96.00%. Body mass index is 27.59 kg/(m^2). Wt Readings from Last 2 Encounters:  01/23/11 148 lb 6.4 oz (67.314 kg)  01/07/11 148 lb 6.4 oz (67.314 kg)    General - Thin HEENT - No sinus tenderness, no oral exudate, no lan, no thyromegaly Cardiac - s1s2 no murmur Chest - prolonged exhalation, no wheeze/rales Abdomen - soft, nontender Extremities - no edema Skin - no rashes Neurologic - normal strength Psychiatric - normal mood, behavior   Assessment/Plan:  Outpatient Encounter Prescriptions as  of 01/23/2011  Medication Sig Dispense Refill  . albuterol (PROAIR HFA) 108 (90 BASE) MCG/ACT inhaler Inhale 2 puffs into the lungs every 4 (four) hours as needed.  1 Inhaler  5  . amLODipine (NORVASC) 5 MG tablet Take 1 tablet (5 mg total) by mouth daily.  30 tablet  5  . aspirin EC 81 MG tablet Take 1 tablet (81 mg total) by mouth daily.  150 tablet  2  . diazepam (VALIUM) 5 MG tablet Take 1 tablet (5 mg total) by mouth at bedtime.  30 tablet  5  . Fluticasone-Salmeterol (ADVAIR DISKUS) 250-50 MCG/DOSE AEPB Inhale 1 puff into the lungs 2 (two) times daily.  60 each  1  . HYDROcodone-homatropine (HYCODAN) 5-1.5 MG/5ML syrup Take 5 mLs by mouth every 6 (six) hours as needed.        Marland Kitchen levothyroxine (SYNTHROID,  LEVOTHROID) 88 MCG tablet Take 88 mcg by mouth daily.        Marland Kitchen loratadine (CLARITIN) 10 MG tablet Take 10 mg by mouth as needed.        Marland Kitchen losartan (COZAAR) 100 MG tablet Take 1 tablet (100 mg total) by mouth daily.  30 tablet  4  . mometasone (NASONEX) 50 MCG/ACT nasal spray 2 sprays by Nasal route daily.        Marland Kitchen omeprazole (PRILOSEC) 20 MG capsule Take 20 mg by mouth daily.        Marland Kitchen oxyCODONE-acetaminophen (PERCOCET) 5-325 MG per tablet Take 1 tablet by mouth every 6 (six) hours as needed.        . roflumilast (DALIRESP) 500 MCG TABS tablet Take 1 tablet (500 mcg total) by mouth daily.  30 tablet  5  . tiotropium (SPIRIVA HANDIHALER) 18 MCG inhalation capsule Place 1 capsule (18 mcg total) into inhaler and inhale daily.  30 capsule  6  . venlafaxine (EFFEXOR-XR) 37.5 MG 24 hr capsule Take 1 capsule (37.5 mg total) by mouth daily.  30 capsule  4  . DISCONTD: predniSONE (DELTASONE) 10 MG tablet Take 40mg  daily for 3 days, then 30mg  daily for 3 days, then 20mg  daily for 3 days, then 10mg  daily for 3 days, then stop   30 tablet  0    Kayden Hutmacher Pager:  [phone] 01/23/2011, 3:24 PM

## 2011-01-23 NOTE — Assessment & Plan Note (Signed)
She is to continue with 2 liters oxygen with exertion as needed and sleep.

## 2011-01-23 NOTE — Patient Instructions (Signed)
Try vapor rub at night to help with cough Follow up in 6 months

## 2011-01-23 NOTE — Assessment & Plan Note (Signed)
She has slow to resolve exacerbation.  I don't think she needs additional prednisone or antibiotics at this time.  She is to continue her current inhaler regimen and daliresp.  I have given samples of daliresp.  Discussed symptomatic therapy for her cough.

## 2011-01-29 ENCOUNTER — Encounter: Payer: Self-pay | Admitting: Family Medicine

## 2011-02-03 ENCOUNTER — Other Ambulatory Visit: Payer: Self-pay | Admitting: *Deleted

## 2011-02-03 MED ORDER — MOMETASONE FUROATE 50 MCG/ACT NA SUSP: 2.0000 | Freq: Every day | NASAL | Status: DC

## 2011-02-06 ENCOUNTER — Ambulatory Visit: Payer: Medicare Other | Admitting: Adult Health

## 2011-02-10 DIAGNOSIS — IMO0002 Reserved for concepts with insufficient information to code with codable children: Secondary | ICD-10-CM | POA: Diagnosis not present

## 2011-02-10 DIAGNOSIS — M47817 Spondylosis without myelopathy or radiculopathy, lumbosacral region: Secondary | ICD-10-CM | POA: Diagnosis not present

## 2011-02-25 ENCOUNTER — Ambulatory Visit (INDEPENDENT_AMBULATORY_CARE_PROVIDER_SITE_OTHER): Payer: Medicare Other | Admitting: Cardiology

## 2011-02-25 ENCOUNTER — Encounter: Payer: Self-pay | Admitting: Cardiology

## 2011-02-25 DIAGNOSIS — Z79899 Other long term (current) drug therapy: Secondary | ICD-10-CM | POA: Diagnosis not present

## 2011-02-25 DIAGNOSIS — I10 Essential (primary) hypertension: Secondary | ICD-10-CM | POA: Diagnosis not present

## 2011-02-25 DIAGNOSIS — E7800 Pure hypercholesterolemia, unspecified: Secondary | ICD-10-CM | POA: Diagnosis not present

## 2011-02-25 DIAGNOSIS — R079 Chest pain, unspecified: Secondary | ICD-10-CM | POA: Diagnosis not present

## 2011-02-25 MED ORDER — PRAVASTATIN SODIUM 20 MG PO TABS: 20.0000 mg | ORAL_TABLET | Freq: Every evening | ORAL | Status: DC

## 2011-02-25 NOTE — Progress Notes (Signed)
HPI: Pleasant female I initially saw in May of 2012 for evaluation of chest pain. [place] CTA performed as part of Promise study; nonobstructive plaque; calcium score of 69; emphysema noted. I last saw her in July of 2012. Since then, she has dyspnea with more moderate activities but not routine activities. No orthopnea, PND, pedal edema or syncope. She occasionally has brief episodes of chest pain in various locations on her chest. They last one to 2 seconds and resolves spontaneously. No exertional chest pain.   Current Outpatient Prescriptions  Medication Sig Dispense Refill  . albuterol (PROAIR HFA) 108 (90 BASE) MCG/ACT inhaler Inhale 2 puffs into the lungs every 4 (four) hours as needed.  1 Inhaler  5  . amLODipine (NORVASC) 5 MG tablet Take 1 tablet (5 mg total) by mouth daily.  30 tablet  5  . aspirin EC 81 MG tablet Take 1 tablet (81 mg total) by mouth daily.  150 tablet  2  . diazepam (VALIUM) 5 MG tablet Take 1 tablet (5 mg total) by mouth at bedtime.  30 tablet  5  . Fluticasone-Salmeterol (ADVAIR DISKUS) 250-50 MCG/DOSE AEPB Inhale 1 puff into the lungs 2 (two) times daily.  60 each  1  . HYDROcodone-homatropine (HYCODAN) 5-1.5 MG/5ML syrup Take 5 mLs by mouth every 6 (six) hours as needed.        Marland Kitchen levothyroxine (SYNTHROID, LEVOTHROID) 88 MCG tablet Take 88 mcg by mouth daily.        Marland Kitchen loratadine (CLARITIN) 10 MG tablet Take 10 mg by mouth as needed.        Marland Kitchen losartan (COZAAR) 100 MG tablet Take 1 tablet (100 mg total) by mouth daily.  30 tablet  4  . omeprazole (PRILOSEC) 20 MG capsule Take 20 mg by mouth daily.        Marland Kitchen oxyCODONE-acetaminophen (PERCOCET) 5-325 MG per tablet Take 1 tablet by mouth every 6 (six) hours as needed.        . roflumilast (DALIRESP) 500 MCG TABS tablet Take 1 tablet (500 mcg total) by mouth daily.  30 tablet  5  . tiotropium (SPIRIVA HANDIHALER) 18 MCG inhalation capsule Place 1 capsule (18 mcg total) into inhaler and inhale daily.  30 capsule  6  .  venlafaxine (EFFEXOR-XR) 37.5 MG 24 hr capsule Take 1 capsule (37.5 mg total) by mouth daily.  30 capsule  4  . mometasone (NASONEX) 50 MCG/ACT nasal spray Place 2 sprays into the nose daily.  17 g  1     Past Medical History  Diagnosis Date  . Hypertension     EKG 03/2010 normal  . COPD (chronic obstructive pulmonary disease)     spirometry 01/10/09 FEV 0.97(52%), FEV1% 47  . Hypoxemia     O2 86% RA with exertion 01/10/09, 2 liters oxygen with exertion and sleep  . Pulmonary nodule     CT chest 12/10, June 2011.  No nodule on CT 06/2010.  Marland Kitchen Spinal stenosis     Symptomatic (neurogenic claudication)  . GERD (gastroesophageal reflux disease)   . Hypothyroidism     Hashimoto's, dx'd 1989  . Depression   . Insomnia   . Plantar fasciitis   . Diverticulosis of colon     Procto '97 and colonoscopy 2002  . DDD (degenerative disc disease), cervical     1998 MRI C5-6 impingemt (left)  . DDD (degenerative disc disease), lumbar   . Spinal stenosis of lumbar region with neurogenic claudication  2008 MRI (L3-4, L4-5, L5-S1)  . Fibrocystic breast disease     w/fibroadenoma.  Bx's showed NO atypia (Dr. Jamey Ripa).  Mammo neg 2008.  Marland Kitchen Chest pain, non-cardiac     Cardiac CT showed no signif obstructive dz, mildly elevated calcium level (Dr. Jens Som).  . History of double vision     Ophthalmologist, Dr. Elmer Picker, is further evaluating this with MRI  orbits and limited brain (myesthenia gravis testing neg 07/2010)    Past Surgical History  Procedure Date  . Cholecystectomy 2001  . Tubal ligation 1974  . Abdominal hysterectomy 1978    no BSO per pt--nonmalignant reasons  . Breast cyst excision     Last screening mammogram 10/2010 was normal.  . Appendectomy   . Tonsillectomy     History   Social History  . Marital Status: Married    Spouse Name: N/A    Number of Children: 1  . Years of Education: N/A   Occupational History  . SECRETARY    Social History Main Topics  . Smoking  status: Former Smoker -- 2.0 packs/day for 35 years    Types: Cigarettes    Quit date: 02/03/1990  . Smokeless tobacco: Never Used  . Alcohol Use: Yes     Rarely  . Drug Use: No  . Sexually Active: No   Other Topics Concern  . Not on file   Social History Narrative   Married, lives in [place], [place].  Works as Radiographer, therapeutic.      ROS: no fevers or chills, productive cough, hemoptysis, dysphasia, odynophagia, melena, hematochezia, dysuria, hematuria, rash, seizure activity, orthopnea, PND, pedal edema, claudication. Remaining systems are negative.  Physical Exam: Well-developed well-nourished in no acute distress.  Skin is warm and dry.  HEENT is normal.  Neck is supple. No thyromegaly.  Chest is clear to auscultation with normal expansion.  Cardiovascular exam is regular rate and rhythm.  Abdominal exam nontender or distended. No masses palpated. Extremities show no edema. neuro grossly intact  ECG sinus rhythm at a rate of 75. Axis normal. No ST changes appear

## 2011-02-25 NOTE — Assessment & Plan Note (Signed)
Blood pressure controlled. Continue present medications. 

## 2011-02-25 NOTE — Patient Instructions (Signed)
Your physician wants you to follow-up in: ONE YEAR You will receive a reminder letter in the mail two months in advance. If you don't receive a letter, please call our office to schedule the follow-up appointment  START PRAVACHOL 20 MG ONCE DAILY AT BEDTIME  Your physician recommends that you return for lab work in: 6 WEEKS=1ST WEEK OF MARCH=8:30 AM

## 2011-02-25 NOTE — Assessment & Plan Note (Signed)
Symptoms not consistent with cardiac pain. Her chest CT shows elevated calcium score. No obstructive disease. Continue aspirin. Given elevated calcium score will add Pravachol 20 mg daily. Check lipids and liver in 6 weeks.

## 2011-03-03 DIAGNOSIS — H532 Diplopia: Secondary | ICD-10-CM | POA: Diagnosis not present

## 2011-03-03 DIAGNOSIS — H04129 Dry eye syndrome of unspecified lacrimal gland: Secondary | ICD-10-CM | POA: Diagnosis not present

## 2011-03-03 DIAGNOSIS — H53149 Visual discomfort, unspecified: Secondary | ICD-10-CM | POA: Diagnosis not present

## 2011-03-03 DIAGNOSIS — H5319 Other subjective visual disturbances: Secondary | ICD-10-CM | POA: Diagnosis not present

## 2011-03-10 ENCOUNTER — Other Ambulatory Visit: Payer: Self-pay | Admitting: Pulmonary Disease

## 2011-03-11 ENCOUNTER — Other Ambulatory Visit: Payer: Self-pay | Admitting: Family Medicine

## 2011-03-11 MED ORDER — ZOSTER VACCINE LIVE 19400 UNT/0.65ML ~~LOC~~ SOLR: 0.6500 mL | Freq: Once | SUBCUTANEOUS | Status: AC

## 2011-03-11 NOTE — Progress Notes (Signed)
A user error has taken place: encounter opened in error, closed for administrative reasons.

## 2011-03-17 ENCOUNTER — Telehealth: Payer: Self-pay | Admitting: Internal Medicine

## 2011-03-17 NOTE — Telephone Encounter (Signed)
This is Dr. Dietrich Pates patient, she hasnt had a mammogram since October, and it was normal.

## 2011-03-17 NOTE — Telephone Encounter (Signed)
Her mammogram was incomplete.  Have they called her for additional views on the right?

## 2011-03-17 NOTE — Telephone Encounter (Signed)
Wrong Priscilla Cantrell.  l

## 2011-03-19 ENCOUNTER — Telehealth: Payer: Self-pay | Admitting: Cardiology

## 2011-03-19 NOTE — Telephone Encounter (Signed)
New Problem   Patient would like a return call regarding Pravastatin RX, she is having side affects. She can be reached at hm# [phone]

## 2011-03-19 NOTE — Telephone Encounter (Signed)
Spoke with pt, she is having pain in both legs that she feels is related to taking pravastatin. Okay given for pt to stop the pravachol. She is not interested in trying a different statin at this time. Labs scheduled for march will be canceled.

## 2011-03-20 ENCOUNTER — Encounter: Payer: Self-pay | Admitting: Family Medicine

## 2011-03-20 ENCOUNTER — Other Ambulatory Visit: Payer: Self-pay | Admitting: Pulmonary Disease

## 2011-03-20 ENCOUNTER — Ambulatory Visit (INDEPENDENT_AMBULATORY_CARE_PROVIDER_SITE_OTHER): Payer: Medicare Other | Admitting: Family Medicine

## 2011-03-20 VITALS — BP 115/74 | HR 82 | Temp 99.4°F | Ht 60.75 in | Wt 147.0 lb

## 2011-03-20 DIAGNOSIS — J209 Acute bronchitis, unspecified: Secondary | ICD-10-CM | POA: Diagnosis not present

## 2011-03-20 MED ORDER — AZITHROMYCIN 250 MG PO TABS: ORAL_TABLET | ORAL | Status: DC

## 2011-03-20 MED ORDER — PREDNISONE 20 MG PO TABS: ORAL_TABLET | ORAL | Status: DC

## 2011-03-20 NOTE — Progress Notes (Signed)
OFFICE NOTE  03/20/2011  CC:  Chief Complaint  Patient presents with  . URI    ? Bronchitis     HPI: Patient is a 76 y.o. Caucasian female who is here for cough. Pt presents complaining of respiratory symptoms for 4 days.  Mostly nasal congestion/runny nose, sneezing, and PND cough.  She feels like some of her cough is PND cough but also feels some is "chest" cough.  Sites green and thick secretions from nose, also coughs some up..  Lately the symptoms seem to be worsening.  +Hoarse voice. No fevers, no wheezing, and no SOB.  No pain in face or teeth.  No significant HA.  ST mild at most.  Symptoms made worse by cold air.  Symptoms improved by nothing. Smoker? ex Muscle or joint aches? no Flu shot this season at least 2 wks ago? yes  ROS: no n/v/d or abdominal pain.  No rash.  No neck stiffness.   +Mild fatigue.  +Mild appetite loss.    Pertinent PMH:  Past Medical History  Diagnosis Date  . Hypertension     EKG 03/2010 normal  . COPD (chronic obstructive pulmonary disease)     spirometry 01/10/09 FEV 0.97(52%), FEV1% 47  . Hypoxemia     O2 86% RA with exertion 01/10/09, 2 liters oxygen with exertion and sleep  . Pulmonary nodule     CT chest 12/10, June 2011.  No nodule on CT 06/2010.  Marland Kitchen Spinal stenosis     Symptomatic (neurogenic claudication)  . GERD (gastroesophageal reflux disease)   . Hypothyroidism     Hashimoto's, dx'd 1989  . Depression   . Insomnia   . Plantar fasciitis   . Diverticulosis of colon     Procto '97 and colonoscopy 2002  . DDD (degenerative disc disease), cervical     1998 MRI C5-6 impingemt (left)  . DDD (degenerative disc disease), lumbar   . Spinal stenosis of lumbar region with neurogenic claudication     2008 MRI (L3-4, L4-5, L5-S1)  . Fibrocystic breast disease     w/fibroadenoma.  Bx's showed NO atypia (Dr. Jamey Ripa).  Mammo neg 2008.  Marland Kitchen Chest pain, non-cardiac     Cardiac CT showed no signif obstructive dz, mildly elevated calcium level  (Dr. Jens Som).  . History of double vision     Ophthalmologist, Dr. Elmer Picker, is further evaluating this with MRI  orbits and limited brain (myesthenia gravis testing neg 07/2010)   Past surgical, social, and family history reviewed and no changes noted since last office visit.  MEDS:  Outpatient Prescriptions Prior to Visit  Medication Sig Dispense Refill  . albuterol (PROAIR HFA) 108 (90 BASE) MCG/ACT inhaler Inhale 2 puffs into the lungs every 4 (four) hours as needed.  1 Inhaler  5  . amLODipine (NORVASC) 5 MG tablet Take 1 tablet (5 mg total) by mouth daily.  30 tablet  5  . aspirin EC 81 MG tablet Take 1 tablet (81 mg total) by mouth daily.  150 tablet  2  . diazepam (VALIUM) 5 MG tablet Take 1 tablet (5 mg total) by mouth at bedtime.  30 tablet  5  . HYDROcodone-homatropine (HYCODAN) 5-1.5 MG/5ML syrup Take 5 mLs by mouth every 6 (six) hours as needed.        Marland Kitchen levothyroxine (SYNTHROID, LEVOTHROID) 88 MCG tablet Take 88 mcg by mouth daily.        Marland Kitchen loratadine (CLARITIN) 10 MG tablet Take 10 mg by mouth as needed.        Marland Kitchen  losartan (COZAAR) 100 MG tablet Take 1 tablet (100 mg total) by mouth daily.  30 tablet  4  . mometasone (NASONEX) 50 MCG/ACT nasal spray Place 2 sprays into the nose daily.  17 g  1  . omeprazole (PRILOSEC) 20 MG capsule Take 20 mg by mouth daily.        Marland Kitchen oxyCODONE-acetaminophen (PERCOCET) 5-325 MG per tablet Take 1 tablet by mouth every 6 (six) hours as needed.        . roflumilast (DALIRESP) 500 MCG TABS tablet Take 1 tablet (500 mcg total) by mouth daily.  30 tablet  5  . tiotropium (SPIRIVA HANDIHALER) 18 MCG inhalation capsule Place 1 capsule (18 mcg total) into inhaler and inhale daily.  30 capsule  6  . venlafaxine (EFFEXOR-XR) 37.5 MG 24 hr capsule Take 1 capsule (37.5 mg total) by mouth daily.  30 capsule  4  . ADVAIR DISKUS 250-50 MCG/DOSE AEPB INHALE 1 PUFF INTO THE LUNGS TWICE A DAY .  60 each  1  . pravastatin (PRAVACHOL) 20 MG tablet Take 1 tablet (20 mg  total) by mouth every evening.  30 tablet  11    PE: Blood pressure 115/74, pulse 82, temperature 99.4 F (37.4 C), temperature source Temporal, height 5' 0.75" (1.543 m), weight 147 lb (66.679 kg), SpO2 90.00%. VS: noted--normal. Gen: alert, NAD, NONTOXIC APPEARING. HEENT: eyes without injection, drainage, or swelling.  Ears: EACs clear, TMs with normal light reflex and landmarks.  Nose: Clear rhinorrhea, with some dried, crusty exudate adherent to mildly injected mucosa.  No purulent d/c.  No paranasal sinus TTP.  No facial swelling.  Throat and mouth without focal lesion.  No pharyngial swelling, erythema, or exudate.   Neck: supple, no LAD.   LUNGS: CTA bilat, nonlabored resps.   CV: RRR, no m/r/g. EXT: no c/c/e SKIN: no rash    IMPRESSION AND PLAN: Acute bronchitis She has an illness c/w URI (likely viral) plus has mild bronchitis without signs of any worsening of her RAD at this time. Given her severe COPD she is at high risk of bacterial bronchitis, so will start azithromycin x 5d. Will hold off on any systemic steroids at this time but will be quick to add them if wheezing, SOB, chest tightness, increased bronchodilator requirement, etc occurs.  Continue mucinex but change to DM instead of plain.  Continue saline nasal rinse and daily nasonex.      FOLLOW UP: prn

## 2011-03-21 DIAGNOSIS — J209 Acute bronchitis, unspecified: Secondary | ICD-10-CM | POA: Insufficient documentation

## 2011-03-21 NOTE — Assessment & Plan Note (Signed)
She has an illness c/w URI (likely viral) plus has mild bronchitis without signs of any worsening of her RAD at this time. Given her severe COPD she is at high risk of bacterial bronchitis, so will start azithromycin x 5d. Will hold off on any systemic steroids at this time but will be quick to add them if wheezing, SOB, chest tightness, increased bronchodilator requirement, etc occurs.  Continue mucinex but change to DM instead of plain.  Continue saline nasal rinse and daily nasonex.

## 2011-03-25 ENCOUNTER — Other Ambulatory Visit: Payer: Self-pay | Admitting: Family Medicine

## 2011-03-25 ENCOUNTER — Telehealth: Payer: Self-pay | Admitting: *Deleted

## 2011-03-25 DIAGNOSIS — E039 Hypothyroidism, unspecified: Secondary | ICD-10-CM

## 2011-03-25 DIAGNOSIS — I10 Essential (primary) hypertension: Secondary | ICD-10-CM

## 2011-03-25 DIAGNOSIS — J449 Chronic obstructive pulmonary disease, unspecified: Secondary | ICD-10-CM

## 2011-03-25 NOTE — Telephone Encounter (Signed)
Yes, I'll enter some labs (does NOT need to be fasting) so go ahead and get her on the lab schedule.  thx

## 2011-03-25 NOTE — Telephone Encounter (Signed)
Pt has appt on 1/25 and would like to know if she is due for labs.  Pt able to come this week to have results at appt.  Last TSH in 07/2010, but I can not find any notes stating when repeat is due.  Please advise.

## 2011-03-26 ENCOUNTER — Other Ambulatory Visit (INDEPENDENT_AMBULATORY_CARE_PROVIDER_SITE_OTHER): Payer: Medicare Other

## 2011-03-26 DIAGNOSIS — E039 Hypothyroidism, unspecified: Secondary | ICD-10-CM

## 2011-03-26 DIAGNOSIS — J449 Chronic obstructive pulmonary disease, unspecified: Secondary | ICD-10-CM

## 2011-03-26 DIAGNOSIS — I10 Essential (primary) hypertension: Secondary | ICD-10-CM

## 2011-03-26 NOTE — Telephone Encounter (Signed)
Pt notified.  Added to lab schedule for this PM.

## 2011-03-27 LAB — CBC WITH DIFFERENTIAL/PLATELET
Basophils Absolute: 0 10*3/uL (ref 0.0–0.1)
Basophils Relative: 0 % (ref 0–1)
Eosinophils Absolute: 0 10*3/uL (ref 0.0–0.7)
Eosinophils Relative: 0 % (ref 0–5)
HCT: 39.5 % (ref 36.0–46.0)
Hemoglobin: 12.4 g/dL (ref 12.0–15.0)
Lymphocytes Relative: 11 % — ABNORMAL LOW (ref 12–46)
Lymphs Abs: 1.2 10*3/uL (ref 0.7–4.0)
MCH: 26.4 pg (ref 26.0–34.0)
MCHC: 31.4 g/dL (ref 30.0–36.0)
MCV: 84.2 fL (ref 78.0–100.0)
Monocytes Absolute: 0.2 10*3/uL (ref 0.1–1.0)
Monocytes Relative: 2 % — ABNORMAL LOW (ref 3–12)
Neutro Abs: 9.3 10*3/uL — ABNORMAL HIGH (ref 1.7–7.7)
Neutrophils Relative %: 87 % — ABNORMAL HIGH (ref 43–77)
Platelets: 399 10*3/uL (ref 150–400)
RBC: 4.69 MIL/uL (ref 3.87–5.11)
RDW: 14.8 % (ref 11.5–15.5)
WBC: 10.7 10*3/uL — ABNORMAL HIGH (ref 4.0–10.5)

## 2011-03-27 LAB — BASIC METABOLIC PANEL
BUN: 16 mg/dL (ref 6–23)
CO2: 21 mEq/L (ref 19–32)
Calcium: 9.4 mg/dL (ref 8.4–10.5)
Chloride: 106 mEq/L (ref 96–112)
Creat: 0.8 mg/dL (ref 0.50–1.10)
Glucose, Bld: 140 mg/dL — ABNORMAL HIGH (ref 70–99)
Potassium: 4.1 mEq/L (ref 3.5–5.3)
Sodium: 141 mEq/L (ref 135–145)

## 2011-03-27 LAB — TSH: TSH: 0.772 u[IU]/mL (ref 0.350–4.500)

## 2011-03-31 ENCOUNTER — Ambulatory Visit (INDEPENDENT_AMBULATORY_CARE_PROVIDER_SITE_OTHER): Payer: Medicare Other | Admitting: Family Medicine

## 2011-03-31 ENCOUNTER — Encounter: Payer: Self-pay | Admitting: Family Medicine

## 2011-03-31 DIAGNOSIS — I10 Essential (primary) hypertension: Secondary | ICD-10-CM | POA: Diagnosis not present

## 2011-03-31 DIAGNOSIS — J449 Chronic obstructive pulmonary disease, unspecified: Secondary | ICD-10-CM

## 2011-03-31 DIAGNOSIS — E039 Hypothyroidism, unspecified: Secondary | ICD-10-CM | POA: Diagnosis not present

## 2011-03-31 DIAGNOSIS — J209 Acute bronchitis, unspecified: Secondary | ICD-10-CM | POA: Diagnosis not present

## 2011-03-31 MED ORDER — DIAZEPAM 5 MG PO TABS: 5.0000 mg | ORAL_TABLET | Freq: Every day | ORAL | Status: DC

## 2011-03-31 NOTE — Assessment & Plan Note (Signed)
Lab Results  Component Value Date   TSH 0.772 03/26/2011   Problem stable.  Continue current medications and diet appropriate for this condition.  We have reviewed our general long term plan for this problem and also reviewed symptoms and signs that should prompt the patient to call or return to the office.

## 2011-03-31 NOTE — Assessment & Plan Note (Signed)
Problem stable.  Continue current medications and diet appropriate for this condition.  We have reviewed our general long term plan for this problem and also reviewed symptoms and signs that should prompt the patient to call or return to the office.  

## 2011-03-31 NOTE — Progress Notes (Signed)
OFFICE NOTE  03/31/2011  CC:  Chief Complaint  Patient presents with  . Hypothyroidism  . Hypertension  . COPD     HPI: Patient is a 76 y.o. Caucasian female who is here for f/u acute exac of chronic bronchitis, plus routine f/u for hypothyroidism and HTN.  Finishing up prednisone taper today.  Nasal irrigation starting to help.  Using nasonex.   Not requiring albuterol.  Energy level returning to normal.  Appetite fine.  Taking synthroid regularly, no problems. Taking antihypertensives regularly, no problems.  She does not monitor bp outside of the office. Denies HA, CP, dizziness, or focal weakness.    Pertinent PMH:  Past Medical History  Diagnosis Date  . Hypertension     EKG 03/2010 normal  . COPD (chronic obstructive pulmonary disease)     spirometry 01/10/09 FEV 0.97(52%), FEV1% 47  . Hypoxemia     O2 86% RA with exertion 01/10/09, 2 liters oxygen with exertion and sleep  . Pulmonary nodule     CT chest 12/10, June 2011.  No nodule on CT 06/2010.  Marland Kitchen Spinal stenosis     Symptomatic (neurogenic claudication)  . GERD (gastroesophageal reflux disease)   . Hypothyroidism     Hashimoto's, dx'd 1989  . Depression   . Insomnia   . Plantar fasciitis   . Diverticulosis of colon     Procto '97 and colonoscopy 2002  . DDD (degenerative disc disease), cervical     1998 MRI C5-6 impingemt (left)  . DDD (degenerative disc disease), lumbar   . Spinal stenosis of lumbar region with neurogenic claudication     2008 MRI (L3-4, L4-5, L5-S1)  . Fibrocystic breast disease     w/fibroadenoma.  Bx's showed NO atypia (Dr. Jamey Ripa).  Mammo neg 2008.  Marland Kitchen Chest pain, non-cardiac     Cardiac CT showed no signif obstructive dz, mildly elevated calcium level (Dr. Jens Som).  . History of double vision     Ophthalmologist, Dr. Elmer Picker, is further evaluating this with MRI  orbits and limited brain (myesthenia gravis testing neg 07/2010)   Past surgical, social, and family history reviewed and no  changes noted since last office visit.  MEDS:  Outpatient Prescriptions Prior to Visit  Medication Sig Dispense Refill  . ADVAIR DISKUS 250-50 MCG/DOSE AEPB INHALE 1 PUFF INTO THE LUNGS TWICE A DAY .  60 each  3  . albuterol (PROAIR HFA) 108 (90 BASE) MCG/ACT inhaler Inhale 2 puffs into the lungs every 4 (four) hours as needed.  1 Inhaler  5  . amLODipine (NORVASC) 5 MG tablet Take 1 tablet (5 mg total) by mouth daily.  30 tablet  5  . aspirin EC 81 MG tablet Take 1 tablet (81 mg total) by mouth daily.  150 tablet  2  . HYDROcodone-homatropine (HYCODAN) 5-1.5 MG/5ML syrup Take 5 mLs by mouth every 6 (six) hours as needed.        Marland Kitchen levothyroxine (SYNTHROID, LEVOTHROID) 88 MCG tablet Take 88 mcg by mouth daily.        Marland Kitchen loratadine (CLARITIN) 10 MG tablet Take 10 mg by mouth as needed.        Marland Kitchen losartan (COZAAR) 100 MG tablet Take 1 tablet (100 mg total) by mouth daily.  30 tablet  4  . mometasone (NASONEX) 50 MCG/ACT nasal spray Place 2 sprays into the nose daily.  17 g  1  . omeprazole (PRILOSEC) 20 MG capsule Take 20 mg by mouth daily.        Marland Kitchen  oxyCODONE-acetaminophen (PERCOCET) 5-325 MG per tablet Take 1 tablet by mouth every 6 (six) hours as needed.        . roflumilast (DALIRESP) 500 MCG TABS tablet Take 1 tablet (500 mcg total) by mouth daily.  30 tablet  5  . tiotropium (SPIRIVA HANDIHALER) 18 MCG inhalation capsule Place 1 capsule (18 mcg total) into inhaler and inhale daily.  30 capsule  6  . venlafaxine (EFFEXOR-XR) 37.5 MG 24 hr capsule Take 1 capsule (37.5 mg total) by mouth daily.  30 capsule  4  . diazepam (VALIUM) 5 MG tablet Take 1 tablet (5 mg total) by mouth at bedtime.  30 tablet  5  . azithromycin (ZITHROMAX) 250 MG tablet 2 tabs po qd x 1d then 1 tab po qd x 4d  6 each  0  . predniSONE (DELTASONE) 20 MG tablet 2 tabs po qd x 5d, then 1 tab po qd x 5d  15 tablet  0    PE: Blood pressure 110/68, pulse 73, height 5' 0.75" (1.543 m), weight 143 lb (64.864 kg). Gen: Alert,  well appearing.  Patient is oriented to person, place, time, and situation. ENT: Ears: EACs clear, normal epithelium.  TMs with good light reflex and landmarks bilaterally.  Eyes: no injection, icteris, swelling, or exudate.  EOMI, PERRLA. Nose: no drainage or turbinate edema/swelling.  No injection or focal lesion.  Mouth: lips without lesion/swelling.  Oral mucosa pink and moist.  Dentition intact and without obvious caries or gingival swelling.  Oropharynx without erythema, exudate, or swelling.  CV: RRR, no m/r/g.   LUNGS: CTA bilat, nonlabored resps, good aeration in all lung fields. EXT: no clubbing, cyanosis, or edema.   LABS: none today.   Chemistry      Component Value Date/Time   NA 141 03/26/2011 1435   K 4.1 03/26/2011 1435   CL 106 03/26/2011 1435   CO2 21 03/26/2011 1435   BUN 16 03/26/2011 1435   CREATININE 0.80 03/26/2011 1435   CREATININE 0.8 06/05/2010 1152      Component Value Date/Time   CALCIUM 9.4 03/26/2011 1435   ALKPHOS 86 03/07/2010 1526   AST 20 03/07/2010 1526   ALT 18 03/07/2010 1526   BILITOT 0.4 03/07/2010 1526     Lab Results  Component Value Date   WBC 10.7* 03/26/2011   HGB 12.4 03/26/2011   HCT 39.5 03/26/2011   MCV 84.2 03/26/2011   PLT 399 03/26/2011   Lab Results  Component Value Date   TSH 0.772 03/26/2011     IMPRESSION AND PLAN:  HYPOTHYROIDISM Lab Results  Component Value Date   TSH 0.772 03/26/2011   Problem stable.  Continue current medications and diet appropriate for this condition.  We have reviewed our general long term plan for this problem and also reviewed symptoms and signs that should prompt the patient to call or return to the office.   ESSENTIAL HYPERTENSION Problem stable.  Continue current medications and diet appropriate for this condition.  We have reviewed our general long term plan for this problem and also reviewed symptoms and signs that should prompt the patient to call or return to the office.   COPD With recent acute  exac, resolving appropriately on systemic steroids and a course of zithromax. Continue current chronic COPD meds +nocturnal Oxygen, also continue routine pulmonologist follow up.     FOLLOW UP: 42mo

## 2011-03-31 NOTE — Assessment & Plan Note (Signed)
With recent acute exac, resolving appropriately on systemic steroids and a course of zithromax. Continue current chronic COPD meds +nocturnal Oxygen, also continue routine pulmonologist follow up.

## 2011-04-02 ENCOUNTER — Telehealth: Payer: Self-pay | Admitting: *Deleted

## 2011-04-02 MED ORDER — DIAZEPAM 5 MG PO TABS: 5.0000 mg | ORAL_TABLET | Freq: Every day | ORAL | Status: DC

## 2011-04-02 NOTE — Telephone Encounter (Signed)
Addended by: Luisa Dago on: 04/02/2011 03:24 PM   Modules accepted: Orders

## 2011-04-02 NOTE — Telephone Encounter (Signed)
Verbal called to [facility] at [facility] for 90 x 1 (can not do two refills as this is controlled substance).  Corrected in med list.

## 2011-04-02 NOTE — Telephone Encounter (Signed)
Patient needs RX faxed to [facility].  They will not accept printed RX we gave patient at last visit.

## 2011-04-07 ENCOUNTER — Other Ambulatory Visit: Payer: Medicare Other | Admitting: *Deleted

## 2011-04-15 ENCOUNTER — Ambulatory Visit (INDEPENDENT_AMBULATORY_CARE_PROVIDER_SITE_OTHER): Payer: Medicare Other | Admitting: Family Medicine

## 2011-04-15 ENCOUNTER — Encounter: Payer: Self-pay | Admitting: Family Medicine

## 2011-04-15 VITALS — BP 117/70 | HR 70 | Temp 98.2°F | Ht 61.0 in | Wt 141.4 lb

## 2011-04-15 DIAGNOSIS — F329 Major depressive disorder, single episode, unspecified: Secondary | ICD-10-CM

## 2011-04-15 DIAGNOSIS — F341 Dysthymic disorder: Secondary | ICD-10-CM

## 2011-04-15 DIAGNOSIS — F32A Depression, unspecified: Secondary | ICD-10-CM

## 2011-04-15 DIAGNOSIS — F418 Other specified anxiety disorders: Secondary | ICD-10-CM | POA: Insufficient documentation

## 2011-04-15 DIAGNOSIS — F419 Anxiety disorder, unspecified: Secondary | ICD-10-CM

## 2011-04-15 DIAGNOSIS — I10 Essential (primary) hypertension: Secondary | ICD-10-CM | POA: Diagnosis not present

## 2011-04-15 HISTORY — DX: Other specified anxiety disorders: F41.8

## 2011-04-15 MED ORDER — DIAZEPAM 5 MG PO TABS: 5.0000 mg | ORAL_TABLET | Freq: Two times a day (BID) | ORAL | Status: DC | PRN

## 2011-04-15 MED ORDER — VENLAFAXINE HCL 50 MG PO TABS: 50.0000 mg | ORAL_TABLET | Freq: Two times a day (BID) | ORAL | Status: DC

## 2011-04-15 NOTE — Progress Notes (Signed)
Patient ID: Priscilla Cantrell, female   DOB: [birth year], 76 y.o.   MRN: [id number] Priscilla Cantrell [id number] 1935/02/28 04/15/2011      Progress Note-Follow Up  Subjective  Chief Complaint  Chief Complaint  Patient presents with  . light headed    "feels like she is going to fly away"  . Shaking    HPI  Patient is a 76 year old Caucasian female who is in today for evaluation of increasing anxiety depression. 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 He's become a temperature as a result she feels she comes in. She feels shaky and anxious a lot of the time. She reports at that site over the weekend and she is here today to consider increasing her medications. She's been on venlafaxine 37.5 mg for quite some time and has tolerated well. She's been using Aleve but only 2.5 mg daily she also does find it helpful. She denies any recent or recent illness, fevers chills chest pain, palpations, shortness of breath, GI or GU complaints noted.  Past Medical History  Diagnosis Date  . Hypertension     EKG 03/2010 normal  . COPD (chronic obstructive pulmonary disease)     spirometry 01/10/09 FEV 0.97(52%), FEV1% 47  . Hypoxemia     O2 86% RA with exertion 01/10/09, 2 liters oxygen with exertion and sleep  . Pulmonary nodule     CT chest 12/10, June 2011.  No nodule on CT 06/2010.  Marland Kitchen Spinal stenosis     Symptomatic (neurogenic claudication)  . GERD (gastroesophageal reflux disease)   . Hypothyroidism     Hashimoto's, dx'd 1989  . Depression   . Insomnia   . Plantar fasciitis   . Diverticulosis of colon     Procto '97 and colonoscopy 2002  . DDD (degenerative disc disease), cervical     1998 MRI C5-6 impingemt (left)  . DDD (degenerative disc disease), lumbar   . Spinal stenosis of lumbar region with neurogenic claudication     2008 MRI (L3-4, L4-5, L5-S1)  . Fibrocystic breast disease     w/fibroadenoma.  Bx's showed NO atypia (Dr. Jamey Ripa).  Mammo neg 2008.  Marland Kitchen Chest pain,  non-cardiac     Cardiac CT showed no signif obstructive dz, mildly elevated calcium level (Dr. Jens Som).  . History of double vision     Ophthalmologist, Dr. Elmer Picker, is further evaluating this with MRI  orbits and limited brain (myesthenia gravis testing neg 07/2010)  . Depression with anxiety 04/15/2011    Past Surgical History  Procedure Date  . Cholecystectomy 2001  . Tubal ligation 1974  . Abdominal hysterectomy 1978    no BSO per pt--nonmalignant reasons  . Breast cyst excision     Last screening mammogram 10/2010 was normal.  . Appendectomy   . Tonsillectomy     Family History  Problem Relation Age of Onset  . Goiter Mother   . Psoriasis Father     od liver  . Diabetes Daughter     Type 2  . Cancer Paternal Aunt     stomach?  . Stroke Maternal Grandfather   . Stroke Paternal Grandmother   . Hypertension Paternal Grandmother   . Hernia Paternal Grandmother   . Cancer Paternal Grandfather     lung  . Cirrhosis Father     History   Social History  . Marital Status: Married    Spouse Name: N/A    Number of Children: 1  . Years of Education: N/A  Occupational History  . SECRETARY    Social History Main Topics  . Smoking status: Former Smoker -- 2.0 packs/day for 35 years    Types: Cigarettes    Quit date: 02/03/1990  . Smokeless tobacco: Never Used  . Alcohol Use: Yes     Rarely  . Drug Use: No  . Sexually Active: No   Other Topics Concern  . Not on file   Social History Narrative   Married, lives in [place], [place].  Works as Radiographer, therapeutic.      Current Outpatient Prescriptions on File Prior to Visit  Medication Sig Dispense Refill  . ADVAIR DISKUS 250-50 MCG/DOSE AEPB INHALE 1 PUFF INTO THE LUNGS TWICE A DAY .  60 each  3  . albuterol (PROAIR HFA) 108 (90 BASE) MCG/ACT inhaler Inhale 2 puffs into the lungs every 4 (four) hours as needed.  1 Inhaler  5  . amLODipine (NORVASC) 5 MG tablet Take 1 tablet (5 mg total) by mouth daily.  30 tablet   5  . aspirin EC 81 MG tablet Take 1 tablet (81 mg total) by mouth daily.  150 tablet  2  . levothyroxine (SYNTHROID, LEVOTHROID) 88 MCG tablet Take 88 mcg by mouth daily.        Marland Kitchen loratadine (CLARITIN) 10 MG tablet Take 10 mg by mouth as needed.        Marland Kitchen losartan (COZAAR) 100 MG tablet Take 1 tablet (100 mg total) by mouth daily.  30 tablet  4  . mometasone (NASONEX) 50 MCG/ACT nasal spray Place 2 sprays into the nose daily.  17 g  1  . omeprazole (PRILOSEC) 20 MG capsule Take 20 mg by mouth daily.        Marland Kitchen oxyCODONE-acetaminophen (PERCOCET) 5-325 MG per tablet Take 1 tablet by mouth every 6 (six) hours as needed.        . roflumilast (DALIRESP) 500 MCG TABS tablet Take 1 tablet (500 mcg total) by mouth daily.  30 tablet  5  . tiotropium (SPIRIVA HANDIHALER) 18 MCG inhalation capsule Place 1 capsule (18 mcg total) into inhaler and inhale daily.  30 capsule  6  . DISCONTD: diazepam (VALIUM) 5 MG tablet Take 1 tablet (5 mg total) by mouth at bedtime.  90 tablet  1    Allergies  Allergen Reactions  . Ace Inhibitors     REACTION: cough  . Cefixime (ZOX:WRUEAVWU) Other (See Comments)    unspecified  . Penicillins     REACTION: rash, hives, swelling, welts  . Sulfa Antibiotics Other (See Comments)    unspecified    Review of Systems  Review of Systems  Constitutional: Negative for fever and malaise/fatigue.  HENT: Negative for congestion.   Eyes: Negative for discharge.  Respiratory: Negative for shortness of breath.   Cardiovascular: Negative for chest pain, palpitations and leg swelling.  Gastrointestinal: Negative for nausea, abdominal pain and diarrhea.  Genitourinary: Negative for dysuria.  Musculoskeletal: Negative for falls.  Skin: Negative for rash.  Neurological: Negative for loss of consciousness and headaches.  Endo/Heme/Allergies: Negative for polydipsia.  Psychiatric/Behavioral: Positive for depression. Negative for suicidal ideas. The patient is nervous/anxious and has  insomnia.     Objective  BP 117/70  Pulse 70  Temp(Src) 98.2 F (36.8 C) (Temporal)  Ht 5\' 1"  (1.549 m)  Wt 141 lb 6.4 oz (64.139 kg)  BMI 26.72 kg/m2  SpO2 91%  Physical Exam  Physical Exam  Constitutional: She is oriented to person, place, and  time and well-developed, well-nourished, and in no distress. No distress.  HENT:  Head: Normocephalic and atraumatic.  Eyes: Conjunctivae are normal.  Neck: Neck supple. No thyromegaly present.  Cardiovascular: Normal rate, regular rhythm and normal heart sounds.   No murmur heard. Pulmonary/Chest: Effort normal and breath sounds normal. She has no wheezes.  Abdominal: She exhibits no distension and no mass.  Musculoskeletal: She exhibits no edema.  Lymphadenopathy:    She has no cervical adenopathy.  Neurological: She is alert and oriented to person, place, and time.  Skin: Skin is warm and dry. No rash noted. She is not diaphoretic.  Psychiatric: Memory, affect and judgment normal.    Lab Results  Component Value Date   TSH 0.772 03/26/2011   Lab Results  Component Value Date   WBC 10.7* 03/26/2011   HGB 12.4 03/26/2011   HCT 39.5 03/26/2011   MCV 84.2 03/26/2011   PLT 399 03/26/2011   Lab Results  Component Value Date   CREATININE 0.80 03/26/2011   BUN 16 03/26/2011   NA 141 03/26/2011   K 4.1 03/26/2011   CL 106 03/26/2011   CO2 21 03/26/2011   Lab Results  Component Value Date   ALT 18 03/07/2010   AST 20 03/07/2010   ALKPHOS 86 03/07/2010   BILITOT 0.4 03/07/2010   Lab Results  Component Value Date   CHOL 172 08/26/2010   Lab Results  Component Value Date   HDL 52.30 08/26/2010   Lab Results  Component Value Date   LDLCALC 94 08/26/2010   Lab Results  Component Value Date   TRIG 130.0 08/26/2010   Lab Results  Component Value Date   CHOLHDL 3 08/26/2010     Assessment & Plan  ESSENTIAL HYPERTENSION Adequately controlled at today's visit  Depression with anxiety Patient struggling with increased with her  husband. He is increasingly irritable and mean and she is getting more depressed and irritable. She has been on Venlafaxine 37.5 mg for a long while and is ready to increase her dosing. Will increase to 50 mg tab 1 tab in am and 1/2 tab in pm. She will return in 6 weeks or as needed for further evaluation. She is currently taking Diazepam 5 mg 1/2 tab daily and is given permission to take 1/2 to 1 tab po bid for the next couple of weeks as needed during this time

## 2011-04-15 NOTE — Patient Instructions (Signed)
Depression  You have signs of depression. This is a common problem. It can occur at any age. It is often hard to recognize. People can suffer from depression and still have moments of enjoyment. Depression interferes with your basic ability to function in life. It upsets your relationships, sleep, eating, and work habits.  CAUSES   Depression is believed to be caused by an imbalance in brain chemicals. It may be triggered by an unpleasant event. Relationship crises, a death in the family, financial worries, retirement, or other stressors are normal causes of depression. Depression may also start for no known reason. Other factors that may play a part include medical illnesses, some medicines, genetics, and alcohol or drug abuse.  SYMPTOMS    Feeling unhappy or worthless.   Long-lasting (chronic) tiredness or worn-out feeling.   Self-destructive thoughts and actions.   Not being able to sleep or sleeping too much.   Eating more than usual or not eating at all.   Headaches or feeling anxious.   Trouble concentrating or making decisions.   Unexplained physical problems and substance abuse.  TREATMENT   Depression usually gets better with treatment. This can include:   Antidepressant medicines. It can take weeks before the proper dose is achieved and benefits are reached.   Talking with a therapist, clergyperson, counselor, or friend. These people can help you gain insight into your problem and regain control of your life.   Eating a good diet.   Getting regular physical exercise, such as walking for 30 minutes every day.   Not abusing alcohol or drugs.  Treating depression often takes 6 months or longer. This length of treatment is needed to keep symptoms from returning. Call your caregiver and arrange for follow-up care as suggested.  SEEK IMMEDIATE MEDICAL CARE IF:    You start to have thoughts of hurting yourself or others.   Call your local emergency services (911 in [place]   Go to your local  medical emergency department.   Call the National Suicide Prevention Lifeline: 1-800-273-TALK (1-800-273-8255).  Document Released: 01/20/2005 Document Revised: 01/09/2011 Document Reviewed: 06/22/2009  Exit[employer] Patient Information 2012 [employer].

## 2011-04-15 NOTE — Assessment & Plan Note (Signed)
Adequately controlled at today's visit 

## 2011-04-15 NOTE — Assessment & Plan Note (Signed)
Patient struggling with increased with her husband. He is increasingly irritable and mean and she is getting more depressed and irritable. She has been on Venlafaxine 37.5 mg for a long while and is ready to increase her dosing. Will increase to 50 mg tab 1 tab in am and 1/2 tab in pm. She will return in 6 weeks or as needed for further evaluation. She is currently taking Diazepam 5 mg 1/2 tab daily and is given permission to take 1/2 to 1 tab po bid for the next couple of weeks as needed during this time

## 2011-04-24 ENCOUNTER — Other Ambulatory Visit: Payer: Self-pay | Admitting: Family Medicine

## 2011-04-25 MED ORDER — LEVOTHYROXINE SODIUM 88 MCG PO TABS: 88.0000 ug | ORAL_TABLET | Freq: Every day | ORAL | Status: DC

## 2011-04-25 NOTE — Telephone Encounter (Signed)
Pt current with follow up.  Pt states she is taking brand.  RX sent.

## 2011-05-21 ENCOUNTER — Telehealth: Payer: Self-pay | Admitting: Pulmonary Disease

## 2011-05-21 DIAGNOSIS — M47817 Spondylosis without myelopathy or radiculopathy, lumbosacral region: Secondary | ICD-10-CM | POA: Diagnosis not present

## 2011-05-21 DIAGNOSIS — M48061 Spinal stenosis, lumbar region without neurogenic claudication: Secondary | ICD-10-CM | POA: Diagnosis not present

## 2011-05-21 DIAGNOSIS — IMO0002 Reserved for concepts with insufficient information to code with codable children: Secondary | ICD-10-CM | POA: Diagnosis not present

## 2011-05-21 NOTE — Telephone Encounter (Signed)
Called and spoke with spouse--VS has an opening tomorrow and can add pt in then. He is only here 1 day next week and was under the impression pt already had an apt scheduled to see VS next week--Spouse is going to have pt call back

## 2011-05-21 NOTE — Telephone Encounter (Signed)
appt set for tomorrow at 11:45. Pt is aware.Carron Curie, CMA

## 2011-05-21 NOTE — Telephone Encounter (Signed)
Returning call.  [phone]

## 2011-05-21 NOTE — Telephone Encounter (Signed)
Mindy please advise as to where to add patient on. Thanks.

## 2011-05-22 ENCOUNTER — Ambulatory Visit (INDEPENDENT_AMBULATORY_CARE_PROVIDER_SITE_OTHER): Payer: Medicare Other | Admitting: Pulmonary Disease

## 2011-05-22 ENCOUNTER — Encounter: Payer: Self-pay | Admitting: Pulmonary Disease

## 2011-05-22 ENCOUNTER — Other Ambulatory Visit: Payer: Self-pay | Admitting: Anesthesiology

## 2011-05-22 VITALS — BP 110/64 | HR 65 | Temp 97.6°F | Ht 61.5 in | Wt 147.2 lb

## 2011-05-22 DIAGNOSIS — J449 Chronic obstructive pulmonary disease, unspecified: Secondary | ICD-10-CM

## 2011-05-22 DIAGNOSIS — R0902 Hypoxemia: Secondary | ICD-10-CM | POA: Insufficient documentation

## 2011-05-22 DIAGNOSIS — M549 Dorsalgia, unspecified: Secondary | ICD-10-CM

## 2011-05-22 MED ORDER — ROFLUMILAST 500 MCG PO TABS: 500.0000 ug | ORAL_TABLET | Freq: Every day | ORAL | Status: DC

## 2011-05-22 NOTE — Patient Instructions (Signed)
Follow up in 6 months 

## 2011-05-22 NOTE — Progress Notes (Signed)
Chief Complaint  Patient presents with  . Follow-up    Pt states her breathing is getting better--not having to use the albuterol inhaler. denies any cough, wheezing, chest tx    History of Present Illness: Priscilla Cantrell is a 76 y.o. female former smoker with GOLD 4 COPD, ACE related cough, pulmonary nodule, and hypoxemia on 2 liters oxygen with exertion and sleep  She has been doing well.  She feels daliresp has helped.  She does not need to use her albuterol much any more.  She has lost about 25 lbs over the past 1.5 years, and feels this has helped her breathing also.  She is not having much cough, wheeze, or sputum  Her main limitation at present is related to back pain from spinal stenosis.  She is followed by Dr. Thyra Breed with pain management.  She is being followed by surgery also.  She has MRI scheduled, and may need surgery.  Past Medical History  Diagnosis Date  . Hypertension     EKG 03/2010 normal  . COPD (chronic obstructive pulmonary disease)     spirometry 01/10/09 FEV 0.97(52%), FEV1% 47  . Hypoxemia     O2 86% RA with exertion 01/10/09, 2 liters oxygen with exertion and sleep  . Pulmonary nodule     CT chest 12/10, June 2011.  No nodule on CT 06/2010.  Marland Kitchen Spinal stenosis     Symptomatic (neurogenic claudication)  . GERD (gastroesophageal reflux disease)   . Hypothyroidism     Hashimoto's, dx'd 1989  . Depression   . Insomnia   . Plantar fasciitis   . Diverticulosis of colon     Procto '97 and colonoscopy 2002  . DDD (degenerative disc disease), cervical     1998 MRI C5-6 impingemt (left)  . DDD (degenerative disc disease), lumbar   . Spinal stenosis of lumbar region with neurogenic claudication     2008 MRI (L3-4, L4-5, L5-S1)  . Fibrocystic breast disease     w/fibroadenoma.  Bx's showed NO atypia (Dr. Jamey Ripa).  Mammo neg 2008.  Marland Kitchen Chest pain, non-cardiac     Cardiac CT showed no signif obstructive dz, mildly elevated calcium level (Dr. Jens Som).  .  History of double vision     Ophthalmologist, Dr. Elmer Picker, is further evaluating this with MRI  orbits and limited brain (myesthenia gravis testing neg 07/2010)  . Depression with anxiety 04/15/2011    Past Surgical History  Procedure Date  . Cholecystectomy 2001  . Tubal ligation 1974  . Abdominal hysterectomy 1978    no BSO per pt--nonmalignant reasons  . Breast cyst excision     Last screening mammogram 10/2010 was normal.  . Appendectomy   . Tonsillectomy     Allergies  Allergen Reactions  . Ace Inhibitors     REACTION: cough  . Cefixime (HYQ:[id number]) Other (See Comments)    unspecified  . Penicillins     REACTION: rash, hives, swelling, welts  . Sulfa Antibiotics Other (See Comments)    unspecified    Physical Exam:  Blood pressure 110/64, pulse 65, temperature 97.6 F (36.4 C), temperature source Oral, height 5' 1.5" (1.562 m), weight 147 lb 3.2 oz (66.769 kg), SpO2 95.00%. Body mass index is 27.36 kg/(m^2). Wt Readings from Last 2 Encounters:  05/22/11 147 lb 3.2 oz (66.769 kg)  04/15/11 141 lb 6.4 oz (64.139 kg)    General - Thin HEENT - No sinus tenderness, no oral exudate, no lan, no thyromegaly Cardiac - s1s2  no murmur Chest - prolonged exhalation, no wheeze/rales Abdomen - soft, nontender Extremities - no edema Skin - no rashes Neurologic - normal strength Psychiatric - normal mood, behavior   Assessment/Plan:  Outpatient Encounter Prescriptions as of 05/22/2011  Medication Sig Dispense Refill  . ADVAIR DISKUS 250-50 MCG/DOSE AEPB INHALE 1 PUFF INTO THE LUNGS TWICE A DAY .  60 each  3  . albuterol (PROAIR HFA) 108 (90 BASE) MCG/ACT inhaler Inhale 2 puffs into the lungs every 4 (four) hours as needed.  1 Inhaler  5  . amLODipine (NORVASC) 5 MG tablet Take 1 tablet (5 mg total) by mouth daily.  30 tablet  5  . aspirin EC 81 MG tablet Take 1 tablet (81 mg total) by mouth daily.  150 tablet  2  . diazepam (VALIUM) 5 MG tablet Take 1 tablet (5 mg total) by  mouth every 12 (twelve) hours as needed for anxiety or sleep.  90 tablet  1  . levothyroxine (SYNTHROID, LEVOTHROID) 88 MCG tablet Take 1 tablet (88 mcg total) by mouth daily.  30 tablet  6  . loratadine (CLARITIN) 10 MG tablet Take 10 mg by mouth as needed.        Marland Kitchen losartan (COZAAR) 100 MG tablet Take 1 tablet (100 mg total) by mouth daily.  30 tablet  4  . mometasone (NASONEX) 50 MCG/ACT nasal spray Place 2 sprays into the nose daily.  17 g  1  . omeprazole (PRILOSEC) 20 MG capsule Take 20 mg by mouth daily.        Marland Kitchen oxyCODONE-acetaminophen (PERCOCET) 5-325 MG per tablet Take 1 tablet by mouth every 6 (six) hours as needed.        . roflumilast (DALIRESP) 500 MCG TABS tablet Take 1 tablet (500 mcg total) by mouth daily.  30 tablet  5  . tiotropium (SPIRIVA HANDIHALER) 18 MCG inhalation capsule Place 1 capsule (18 mcg total) into inhaler and inhale daily.  30 capsule  6  . venlafaxine (EFFEXOR) 50 MG tablet Take 1 tablet (50 mg total) by mouth 2 (two) times daily.  60 tablet  2    Reily Treloar Pager:  [phone] 05/22/2011, 12:09 PM

## 2011-05-22 NOTE — Assessment & Plan Note (Signed)
She is doing reasonably well.  I advised her that she could try to decrease her inhaler regimen as tolerated.  If she decides to do this, then she will use spiriva in the morning and one puff of advair at night.

## 2011-05-22 NOTE — Assessment & Plan Note (Signed)
She is to continue oxygen at night and as needed during the day.     

## 2011-05-26 ENCOUNTER — Ambulatory Visit
Admission: RE | Admit: 2011-05-26 | Discharge: 2011-05-26 | Disposition: A | Discharge status: Home / Self Care | Payer: Medicare Other | Source: Ambulatory Visit | Attending: Anesthesiology | Admitting: Anesthesiology

## 2011-05-26 DIAGNOSIS — M549 Dorsalgia, unspecified: Secondary | ICD-10-CM

## 2011-05-26 DIAGNOSIS — M5126 Other intervertebral disc displacement, lumbar region: Secondary | ICD-10-CM | POA: Diagnosis not present

## 2011-05-26 DIAGNOSIS — M47817 Spondylosis without myelopathy or radiculopathy, lumbosacral region: Secondary | ICD-10-CM | POA: Diagnosis not present

## 2011-05-26 DIAGNOSIS — M5137 Other intervertebral disc degeneration, lumbosacral region: Secondary | ICD-10-CM | POA: Diagnosis not present

## 2011-05-27 ENCOUNTER — Other Ambulatory Visit: Payer: Self-pay | Admitting: Pulmonary Disease

## 2011-05-27 ENCOUNTER — Ambulatory Visit (INDEPENDENT_AMBULATORY_CARE_PROVIDER_SITE_OTHER): Payer: Medicare Other | Admitting: Family Medicine

## 2011-05-27 ENCOUNTER — Encounter: Payer: Self-pay | Admitting: Family Medicine

## 2011-05-27 VITALS — BP 118/80 | HR 68 | Temp 97.9°F | Ht 60.75 in | Wt 145.0 lb

## 2011-05-27 DIAGNOSIS — F341 Dysthymic disorder: Secondary | ICD-10-CM | POA: Diagnosis not present

## 2011-05-27 DIAGNOSIS — I10 Essential (primary) hypertension: Secondary | ICD-10-CM | POA: Diagnosis not present

## 2011-05-27 DIAGNOSIS — F32A Depression, unspecified: Secondary | ICD-10-CM

## 2011-05-27 DIAGNOSIS — F419 Anxiety disorder, unspecified: Secondary | ICD-10-CM

## 2011-05-27 DIAGNOSIS — F418 Other specified anxiety disorders: Secondary | ICD-10-CM

## 2011-05-27 DIAGNOSIS — F329 Major depressive disorder, single episode, unspecified: Secondary | ICD-10-CM

## 2011-05-27 MED ORDER — VENLAFAXINE HCL 50 MG PO TABS: 50.0000 mg | ORAL_TABLET | Freq: Two times a day (BID) | ORAL | Status: DC

## 2011-05-27 NOTE — Assessment & Plan Note (Signed)
Well controlled, no change in meds 

## 2011-05-27 NOTE — Progress Notes (Signed)
Patient ID: Priscilla Cantrell, female   DOB: [birth year], 76 y.o.   MRN: [id number] Priscilla Cantrell [id number] 03/05/1935 05/27/2011      Progress Note-Follow Up  Subjective  Chief Complaint  Chief Complaint  Patient presents with  . Follow-up    6 week     HPI  Patient is a 76 year old Caucasian female who is in today for followup on depression and anxiety. At her last visit we placed her on venlafaxine 50 mg twice a day and she is pleased with her response. She reports she feels much calmer and less anxious. She is less temperamental and aggravated with her husband. She's had no further breakouts her angry outbursts. She notes also her husband has been less critical and short tempered with her as well as he understands the seriousness of her stress she is happy to stay on this dose of medication at this time. The only concern is that he has disrupted her sleep slightly. She takes one dose first thing in the morning and another dose at 9 PM at night. She tried diazepam 5 mg last night and still has some trouble sleeping. She denies any physical concerns other than her chronic back pain for which she took an MRI yesterday. No fevers, congestion, headache, chest pain, palpitations, shortness of breath, GI or GU concerns noted with the new medication  Past Medical History  Diagnosis Date  . Hypertension     EKG 03/2010 normal  . COPD (chronic obstructive pulmonary disease)     spirometry 01/10/09 FEV 0.97(52%), FEV1% 47  . Hypoxemia     O2 86% RA with exertion 01/10/09, 2 liters oxygen with exertion and sleep  . Pulmonary nodule     CT chest 12/10, June 2011.  No nodule on CT 06/2010.  Marland Kitchen Spinal stenosis     Symptomatic (neurogenic claudication)  . GERD (gastroesophageal reflux disease)   . Hypothyroidism     Hashimoto's, dx'd 1989  . Depression   . Insomnia   . Plantar fasciitis   . Diverticulosis of colon     Procto '97 and colonoscopy 2002  . DDD (degenerative disc disease), cervical     1998 MRI C5-6 impingemt (left)  . DDD (degenerative disc disease), lumbar   . Spinal stenosis of lumbar region with neurogenic claudication     2008 MRI (L3-4, L4-5, L5-S1)  . Fibrocystic breast disease     w/fibroadenoma.  Bx's showed NO atypia (Dr. Jamey Ripa).  Mammo neg 2008.  Marland Kitchen Chest pain, non-cardiac     Cardiac CT showed no signif obstructive dz, mildly elevated calcium level (Dr. Jens Som).  . History of double vision     Ophthalmologist, Dr. Elmer Picker, is further evaluating this with MRI  orbits and limited brain (myesthenia gravis testing neg 07/2010)  . Depression with anxiety 04/15/2011    Past Surgical History  Procedure Date  . Cholecystectomy 2001  . Tubal ligation 1974  . Abdominal hysterectomy 1978    no BSO per pt--nonmalignant reasons  . Breast cyst excision     Last screening mammogram 10/2010 was normal.  . Appendectomy   . Tonsillectomy     Family History  Problem Relation Age of Onset  . Goiter Mother   . Psoriasis Father     od liver  . Diabetes Daughter     Type 2  . Cancer Paternal Aunt     stomach?  . Stroke Maternal Grandfather   . Stroke Paternal Grandmother   . Hypertension Paternal Grandmother   .  Hernia Paternal Grandmother   . Cancer Paternal Grandfather     lung  . Cirrhosis Father     History   Social History  . Marital Status: Married    Spouse Name: N/A    Number of Children: 1  . Years of Education: N/A   Occupational History  . SECRETARY    Social History Main Topics  . Smoking status: Former Smoker -- 2.0 packs/day for 35 years    Types: Cigarettes    Quit date: 02/03/1990  . Smokeless tobacco: Never Used  . Alcohol Use: Yes     Rarely  . Drug Use: No  . Sexually Active: No   Other Topics Concern  . Not on file   Social History Narrative   Married, lives in [place], [place].  Works as Radiographer, therapeutic.      Current Outpatient Prescriptions on File Prior to Visit  Medication Sig Dispense Refill  . albuterol  (PROAIR HFA) 108 (90 BASE) MCG/ACT inhaler Inhale 2 puffs into the lungs every 4 (four) hours as needed.  1 Inhaler  5  . amLODipine (NORVASC) 5 MG tablet Take 1 tablet (5 mg total) by mouth daily.  30 tablet  5  . diazepam (VALIUM) 5 MG tablet Take 1 tablet (5 mg total) by mouth every 12 (twelve) hours as needed for anxiety or sleep.  90 tablet  1  . levothyroxine (SYNTHROID, LEVOTHROID) 88 MCG tablet Take 1 tablet (88 mcg total) by mouth daily.  30 tablet  6  . loratadine (CLARITIN) 10 MG tablet Take 10 mg by mouth as needed.        Marland Kitchen losartan (COZAAR) 100 MG tablet Take 1 tablet (100 mg total) by mouth daily.  30 tablet  4  . mometasone (NASONEX) 50 MCG/ACT nasal spray Place 2 sprays into the nose daily.  17 g  1  . omeprazole (PRILOSEC) 20 MG capsule Take 20 mg by mouth daily.        Marland Kitchen oxyCODONE-acetaminophen (PERCOCET) 5-325 MG per tablet Take 1 tablet by mouth every 6 (six) hours as needed.        . roflumilast (DALIRESP) 500 MCG TABS tablet Take 1 tablet (500 mcg total) by mouth daily.  30 tablet  5  . DISCONTD: ADVAIR DISKUS 250-50 MCG/DOSE AEPB INHALE 1 PUFF INTO THE LUNGS TWICE A DAY .  60 each  3  . DISCONTD: tiotropium (SPIRIVA HANDIHALER) 18 MCG inhalation capsule Place 1 capsule (18 mcg total) into inhaler and inhale daily.  30 capsule  6  . aspirin EC 81 MG tablet Take 1 tablet (81 mg total) by mouth daily.  150 tablet  2    Allergies  Allergen Reactions  . Ace Inhibitors     REACTION: cough  . Cefixime (WUJ:WJXBJYNW) Other (See Comments)    unspecified  . Penicillins     REACTION: rash, hives, swelling, welts  . Sulfa Antibiotics Other (See Comments)    unspecified    Review of Systems  Review of Systems  Constitutional: Negative for fever and malaise/fatigue.  HENT: Negative for congestion.   Eyes: Negative for discharge.  Respiratory: Negative for shortness of breath.   Cardiovascular: Negative for chest pain, palpitations and leg swelling.  Gastrointestinal:  Negative for nausea, abdominal pain and diarrhea.  Genitourinary: Negative for dysuria.  Musculoskeletal: Negative for falls.  Skin: Negative for rash.  Neurological: Negative for loss of consciousness and headaches.  Endo/Heme/Allergies: Negative for polydipsia.  Psychiatric/Behavioral: Positive for depression. Negative  for suicidal ideas. The patient is nervous/anxious and has insomnia.        Depression and anxiety are improving    Objective  BP 118/80  Pulse 68  Temp(Src) 97.9 F (36.6 C) (Temporal)  Ht 5' 0.75" (1.543 m)  Wt 145 lb (65.772 kg)  BMI 27.62 kg/m2  SpO2 92%  Physical Exam  Physical Exam  Constitutional: She is oriented to person, place, and time and well-developed, well-nourished, and in no distress. No distress.  HENT:  Head: Normocephalic and atraumatic.  Eyes: Conjunctivae are normal.  Neck: Neck supple. No thyromegaly present.  Cardiovascular: Normal rate, regular rhythm and normal heart sounds.   No murmur heard. Pulmonary/Chest: Effort normal and breath sounds normal. She has no wheezes.  Abdominal: She exhibits no distension and no mass.  Musculoskeletal: She exhibits no edema.  Lymphadenopathy:    She has no cervical adenopathy.  Neurological: She is alert and oriented to person, place, and time.  Skin: Skin is warm and dry. No rash noted. She is not diaphoretic.  Psychiatric: Memory, affect and judgment normal.    Lab Results  Component Value Date   TSH 0.772 03/26/2011   Lab Results  Component Value Date   WBC 10.7* 03/26/2011   HGB 12.4 03/26/2011   HCT 39.5 03/26/2011   MCV 84.2 03/26/2011   PLT 399 03/26/2011   Lab Results  Component Value Date   CREATININE 0.80 03/26/2011   BUN 16 03/26/2011   NA 141 03/26/2011   K 4.1 03/26/2011   CL 106 03/26/2011   CO2 21 03/26/2011   Lab Results  Component Value Date   ALT 18 03/07/2010   AST 20 03/07/2010   ALKPHOS 86 03/07/2010   BILITOT 0.4 03/07/2010   Lab Results  Component Value Date   CHOL  172 08/26/2010   Lab Results  Component Value Date   HDL 52.30 08/26/2010   Lab Results  Component Value Date   LDLCALC 94 08/26/2010   Lab Results  Component Value Date   TRIG 130.0 08/26/2010   Lab Results  Component Value Date   CHOLHDL 3 08/26/2010     Assessment & Plan  ESSENTIAL HYPERTENSION Well controlled, no change in meds  Depression with anxiety Patient is happy with her response to Venlafaxine 50 mg bid, she reports her mood is improved and she feels more in control of her emotions. The only concern is her sleep has been more disrupted. She has been taking first dose at 8am and the second dose at 9 pm, she is encouraged to move her evening dose to dinner time. May take Diazepam 5 to 10 mg qhs and see if that is more helpful.

## 2011-05-27 NOTE — Patient Instructions (Signed)

## 2011-05-27 NOTE — Assessment & Plan Note (Signed)
Patient is happy with her response to Venlafaxine 50 mg bid, she reports her mood is improved and she feels more in control of her emotions. The only concern is her sleep has been more disrupted. She has been taking first dose at 8am and the second dose at 9 pm, she is encouraged to move her evening dose to dinner time. May take Diazepam 5 to 10 mg qhs and see if that is more helpful.

## 2011-05-29 ENCOUNTER — Other Ambulatory Visit: Payer: Self-pay | Admitting: Pulmonary Disease

## 2011-05-30 ENCOUNTER — Other Ambulatory Visit: Payer: Self-pay

## 2011-05-30 DIAGNOSIS — I10 Essential (primary) hypertension: Secondary | ICD-10-CM

## 2011-05-30 MED ORDER — LOSARTAN POTASSIUM 100 MG PO TABS: 100.0000 mg | ORAL_TABLET | Freq: Every day | ORAL | Status: DC

## 2011-06-04 ENCOUNTER — Ambulatory Visit (INDEPENDENT_AMBULATORY_CARE_PROVIDER_SITE_OTHER): Payer: Medicare Other | Admitting: Family Medicine

## 2011-06-04 ENCOUNTER — Encounter: Payer: Self-pay | Admitting: Family Medicine

## 2011-06-04 VITALS — BP 120/74 | HR 83 | Temp 98.4°F | Ht 61.0 in | Wt 145.1 lb

## 2011-06-04 DIAGNOSIS — N390 Urinary tract infection, site not specified: Secondary | ICD-10-CM | POA: Diagnosis not present

## 2011-06-04 DIAGNOSIS — R319 Hematuria, unspecified: Secondary | ICD-10-CM

## 2011-06-04 DIAGNOSIS — R300 Dysuria: Secondary | ICD-10-CM

## 2011-06-04 LAB — POCT URINALYSIS DIPSTICK
Bilirubin, UA: NEGATIVE
Glucose, UA: NEGATIVE
Ketones, UA: NEGATIVE
Nitrite, UA: NEGATIVE
Protein, UA: 30
Spec Grav, UA: 1.01
Urobilinogen, UA: 0.2
pH, UA: 6

## 2011-06-04 MED ORDER — NITROFURANTOIN MONOHYD MACRO 100 MG PO CAPS: 100.0000 mg | ORAL_CAPSULE | Freq: Two times a day (BID) | ORAL | Status: DC

## 2011-06-04 NOTE — Assessment & Plan Note (Signed)
Sent urine for c/s. Based on symptoms and UA today will start macrobid x 5.

## 2011-06-04 NOTE — Progress Notes (Addendum)
OFFICE NOTE  06/04/2011  CC:  Chief Complaint  Patient presents with  . Urinary Tract Infection    X today-painful urination, blood when urinating     HPI: Patient is a 76 y.o. Caucasian female who is here for urinary complaints. Onset of burning with urination this morning (about 4 hours ago).  Denies urinary urgency or frequency. No gross hematuria.  No fever/chills/malaise. She also relates that 5 d/a she fell onto her back, has had a bit more acute on chronic back pain since then. No problems going to the bathroom.  NO nausea or stomach ache.  No vag d/c or bleeding. Some redness on toilet tissue after wiping after urinating.  Pertinent PMH:  No hx of recurrent UTIs.  MEDS:  Outpatient Prescriptions Prior to Visit  Medication Sig Dispense Refill  . albuterol (PROAIR HFA) 108 (90 BASE) MCG/ACT inhaler Inhale 2 puffs into the lungs every 4 (four) hours as needed.  1 Inhaler  5  . amLODipine (NORVASC) 5 MG tablet Take 1 tablet (5 mg total) by mouth daily.  30 tablet  5  . aspirin EC 81 MG tablet Take 1 tablet (81 mg total) by mouth daily.  150 tablet  2  . diazepam (VALIUM) 5 MG tablet Take 1 tablet (5 mg total) by mouth every 12 (twelve) hours as needed for anxiety or sleep.  90 tablet  1  . Fluticasone-Salmeterol (ADVAIR DISKUS) 250-50 MCG/DOSE AEPB       . levothyroxine (SYNTHROID, LEVOTHROID) 88 MCG tablet Take 1 tablet (88 mcg total) by mouth daily.  30 tablet  6  . loratadine (CLARITIN) 10 MG tablet Take 10 mg by mouth as needed.        Marland Kitchen losartan (COZAAR) 100 MG tablet Take 1 tablet (100 mg total) by mouth daily.  30 tablet  2  . mometasone (NASONEX) 50 MCG/ACT nasal spray Place 2 sprays into the nose daily.  17 g  1  . omeprazole (PRILOSEC) 20 MG capsule Take 20 mg by mouth daily.        Marland Kitchen oxyCODONE-acetaminophen (PERCOCET) 5-325 MG per tablet Take 1 tablet by mouth every 6 (six) hours as needed.        . roflumilast (DALIRESP) 500 MCG TABS tablet Take 1 tablet (500 mcg  total) by mouth daily.  30 tablet  5  . SPIRIVA HANDIHALER 18 MCG inhalation capsule PLACE 1 CAPSULE (18 MCG TOTAL) INTO INHALER AND INHALE DAILY.  30 each  6  . venlafaxine (EFFEXOR) 50 MG tablet Take 1 tablet (50 mg total) by mouth 2 (two) times daily.  60 tablet  3    PE: Blood pressure 120/74, pulse 83, temperature 98.4 F (36.9 C), temperature source Temporal, height 5\' 1"  (1.549 m), weight 145 lb 1.9 oz (65.826 kg), SpO2 91.00%. Gen: Alert, well appearing.  Patient is oriented to person, place, time, and situation. No further exam.  LAB: CC UA today showed large blood, 30 mg/dl protein, and moderate LEU   IMPRESSION AND PLAN:  UTI (lower urinary tract infection) Sent urine for c/s. Based on symptoms and UA today will start macrobid x 5.     FOLLOW UP: prn

## 2011-06-06 LAB — URINE CULTURE: Colony Count: 7000

## 2011-06-09 ENCOUNTER — Other Ambulatory Visit: Payer: Self-pay | Admitting: *Deleted

## 2011-06-09 DIAGNOSIS — I10 Essential (primary) hypertension: Secondary | ICD-10-CM

## 2011-06-09 MED ORDER — AMLODIPINE BESYLATE 5 MG PO TABS: 5.0000 mg | ORAL_TABLET | Freq: Every day | ORAL | Status: DC

## 2011-06-09 NOTE — Telephone Encounter (Signed)
Faxed refill request received from pharmacy for amlodipine Last filled by MD on 12/04/10 Last seen on 03/31/11 Follow up 07/29/11 RX sent.

## 2011-06-10 DIAGNOSIS — IMO0002 Reserved for concepts with insufficient information to code with codable children: Secondary | ICD-10-CM | POA: Diagnosis not present

## 2011-06-10 DIAGNOSIS — M47817 Spondylosis without myelopathy or radiculopathy, lumbosacral region: Secondary | ICD-10-CM | POA: Diagnosis not present

## 2011-06-10 DIAGNOSIS — M48061 Spinal stenosis, lumbar region without neurogenic claudication: Secondary | ICD-10-CM | POA: Diagnosis not present

## 2011-06-25 ENCOUNTER — Other Ambulatory Visit: Payer: Self-pay | Admitting: Family Medicine

## 2011-06-25 MED ORDER — LEVOTHYROXINE SODIUM 88 MCG PO TABS: 88.0000 ug | ORAL_TABLET | Freq: Every day | ORAL | Status: DC

## 2011-06-25 NOTE — Telephone Encounter (Signed)
Levothyroxine RX printed.

## 2011-06-26 NOTE — Telephone Encounter (Signed)
Pt notified RX ready.

## 2011-07-08 DIAGNOSIS — M47817 Spondylosis without myelopathy or radiculopathy, lumbosacral region: Secondary | ICD-10-CM | POA: Diagnosis not present

## 2011-07-08 DIAGNOSIS — M25569 Pain in unspecified knee: Secondary | ICD-10-CM | POA: Diagnosis not present

## 2011-07-08 DIAGNOSIS — M25559 Pain in unspecified hip: Secondary | ICD-10-CM | POA: Diagnosis not present

## 2011-07-15 ENCOUNTER — Other Ambulatory Visit: Payer: Self-pay | Admitting: Pulmonary Disease

## 2011-07-29 ENCOUNTER — Ambulatory Visit: Payer: Medicare Other | Admitting: Family Medicine

## 2011-07-30 ENCOUNTER — Ambulatory Visit (INDEPENDENT_AMBULATORY_CARE_PROVIDER_SITE_OTHER): Payer: Medicare Other | Admitting: Family Medicine

## 2011-07-30 ENCOUNTER — Encounter: Payer: Self-pay | Admitting: Family Medicine

## 2011-07-30 VITALS — BP 122/79 | HR 76 | Ht 61.0 in | Wt 145.0 lb

## 2011-07-30 DIAGNOSIS — E039 Hypothyroidism, unspecified: Secondary | ICD-10-CM

## 2011-07-30 DIAGNOSIS — J449 Chronic obstructive pulmonary disease, unspecified: Secondary | ICD-10-CM

## 2011-07-30 DIAGNOSIS — F418 Other specified anxiety disorders: Secondary | ICD-10-CM

## 2011-07-30 DIAGNOSIS — I10 Essential (primary) hypertension: Secondary | ICD-10-CM

## 2011-07-30 DIAGNOSIS — M4800 Spinal stenosis, site unspecified: Secondary | ICD-10-CM | POA: Diagnosis not present

## 2011-07-30 DIAGNOSIS — J4489 Other specified chronic obstructive pulmonary disease: Secondary | ICD-10-CM

## 2011-07-30 DIAGNOSIS — F341 Dysthymic disorder: Secondary | ICD-10-CM | POA: Diagnosis not present

## 2011-07-30 MED ORDER — GABAPENTIN 300 MG PO CAPS: 300.0000 mg | ORAL_CAPSULE | Freq: Three times a day (TID) | ORAL | Status: DC

## 2011-07-30 NOTE — Patient Instructions (Signed)
Take neurontin 300mg  tab once at night for 7d, then 1 in morning and 1 at night x 7d, then 1 tab three times per day.

## 2011-07-30 NOTE — Progress Notes (Signed)
OFFICE VISIT  07/30/2011   CC:  Chief Complaint  Patient presents with  . Follow-up    COPD, HTN     HPI:    Patient is a 76 y.o. Caucasian female who presents for routine f/u COPD, HTN, chronic low back pain. Reports she is doing pretty good overall. Compliant with meds.  Recently went to advair night-time dose only but began to feel a difference in her breathing so she restarted bid dosing.  No change in sputum, cough, or breathing other than that brief period. No outside bp's to report.  Denies HAs, dizziness, CP, or palpitations. Ongoing management/eval of her chronic low back pain by her neurosurgeon most recently showed diffuse lumbar spondylosis, DDD, with spinal stenosis.  Surgeon does not recommend surgery.  She complains of intermittent right leg radiating pain down to ankle, and at other times only the lateral aspect of her ankle hurts--like a deep ache.  No ankle swelling, erythema, or warmth per pt report. She takes very small doses of oxycodone for pain, mostly at night in order to help get to sleep. She says she is doing well from a depression and anxiety standpoint on current dosing of venlafaxine and diazepam..  Past Medical History  Diagnosis Date  . Hypertension     EKG 03/2010 normal  . COPD (chronic obstructive pulmonary disease)     spirometry 01/10/09 FEV 0.97(52%), FEV1% 47  . Hypoxemia     O2 86% RA with exertion 01/10/09, 2 liters oxygen with exertion and sleep  . Pulmonary nodule     CT chest 12/10, June 2011.  No nodule on CT 06/2010.  Marland Kitchen Spinal stenosis     Symptomatic (neurogenic claudication)  . GERD (gastroesophageal reflux disease)   . Hypothyroidism     Hashimoto's, dx'd 1989  . Depression   . Insomnia   . Plantar fasciitis   . Diverticulosis of colon     Procto '97 and colonoscopy 2002  . DDD (degenerative disc disease), cervical     1998 MRI C5-6 impingemt (left)  . DDD (degenerative disc disease), lumbar   . Spinal stenosis of lumbar region  with neurogenic claudication     2008 MRI (L3-4, L4-5, L5-S1)  . Fibrocystic breast disease     w/fibroadenoma.  Bx's showed NO atypia (Dr. Jamey Ripa).  Mammo neg 2008.  Marland Kitchen Chest pain, non-cardiac     Cardiac CT showed no signif obstructive dz, mildly elevated calcium level (Dr. Jens Som).  . History of double vision     Ophthalmologist, Dr. Elmer Picker, is further evaluating this with MRI  orbits and limited brain (myesthenia gravis testing neg 07/2010)  . Depression with anxiety 04/15/2011    Past Surgical History  Procedure Date  . Cholecystectomy 2001  . Tubal ligation 1974  . Abdominal hysterectomy 1978    no BSO per pt--nonmalignant reasons  . Breast cyst excision     Last screening mammogram 10/2010 was normal.  . Appendectomy   . Tonsillectomy    Past social and family history reviewed and no changes noted since last office visit.  Outpatient Prescriptions Prior to Visit  Medication Sig Dispense Refill  . ADVAIR DISKUS 250-50 MCG/DOSE AEPB INHALE 1 PUFF INTO THE LUNGS TWICE A DAY .  60 each  4  . albuterol (PROAIR HFA) 108 (90 BASE) MCG/ACT inhaler Inhale 2 puffs into the lungs every 4 (four) hours as needed.  1 Inhaler  5  . amLODipine (NORVASC) 5 MG tablet Take 1 tablet (5 mg total)  by mouth daily.  30 tablet  5  . aspirin EC 81 MG tablet Take 1 tablet (81 mg total) by mouth daily.  150 tablet  2  . diazepam (VALIUM) 5 MG tablet Take 1 tablet (5 mg total) by mouth every 12 (twelve) hours as needed for anxiety or sleep.  90 tablet  1  . Fluticasone-Salmeterol (ADVAIR DISKUS) 250-50 MCG/DOSE AEPB       . levothyroxine (SYNTHROID, LEVOTHROID) 88 MCG tablet Take 1 tablet (88 mcg total) by mouth daily.  90 tablet  1  . loratadine (CLARITIN) 10 MG tablet Take 10 mg by mouth as needed.        Marland Kitchen losartan (COZAAR) 100 MG tablet Take 1 tablet (100 mg total) by mouth daily.  30 tablet  2  . mometasone (NASONEX) 50 MCG/ACT nasal spray Place 2 sprays into the nose daily.  17 g  1  . omeprazole  (PRILOSEC) 20 MG capsule Take 20 mg by mouth daily.        Marland Kitchen oxyCODONE-acetaminophen (PERCOCET) 5-325 MG per tablet Take 1 tablet by mouth every 6 (six) hours as needed.        . roflumilast (DALIRESP) 500 MCG TABS tablet Take 1 tablet (500 mcg total) by mouth daily.  30 tablet  5  . SPIRIVA HANDIHALER 18 MCG inhalation capsule PLACE 1 CAPSULE (18 MCG TOTAL) INTO INHALER AND INHALE DAILY.  30 each  6  . venlafaxine (EFFEXOR) 50 MG tablet Take 1 tablet (50 mg total) by mouth 2 (two) times daily.  60 tablet  3    Allergies  Allergen Reactions  . Ace Inhibitors     REACTION: cough  . Cefixime (RUE:[id number]) Other (See Comments)    unspecified  . Penicillins     REACTION: rash, hives, swelling, welts  . Sulfa Antibiotics Other (See Comments)    unspecified    ROS As per HPI  PE: Blood pressure 122/79, pulse 76, height 5\' 1"  (1.549 m), weight 145 lb (65.772 kg). Gen: Alert, well appearing.  Patient is oriented to person, place, time, and situation. ENT: Eyes: no injection, icteris, swelling, or exudate.  EOMI, PERRLA. Nose: no drainage or turbinate edema/swelling.  No injection or focal lesion.  Mouth: lips without lesion/swelling.  Oral mucosa pink and moist.  Dentition intact and without obvious caries or gingival swelling.  Oropharynx without erythema, exudate, or swelling.  Neck: supple/nontender.  No LAD, mass, or TM.  Carotid pulses 2+ bilaterally, without bruits. CV: RRR, no m/r/g.   LUNGS: CTA bilat, nonlabored resps, good aeration in all lung fields. EXT: no clubbing, cyanosis, or edema.   LABS:  None today  IMPRESSION AND PLAN:  SPINAL STENOSIS With some neuropathic pain. Will do trial of neurontin (pt says she gained lots of wt on amitriptyline in the past): 300mg  qd and titrate up to 300mg  tid.  ESSENTIAL HYPERTENSION Problem stable.  Continue current medications and diet appropriate for this condition.  We have reviewed our general long term plan for this problem and  also reviewed symptoms and signs that should prompt the patient to call or return to the office. Will check lytes/cr next f/u in 45mo.  Depression with anxiety Problem stable.  Continue current medications and diet appropriate for this condition.  We have reviewed our general long term plan for this problem and also reviewed symptoms and signs that should prompt the patient to call or return to the office.   COPD Problem stable.  Continue current medications and  diet appropriate for this condition.  We have reviewed our general long term plan for this problem and also reviewed symptoms and signs that should prompt the patient to call or return to the office.   HYPOTHYROIDISM Lab Results  Component Value Date   TSH 0.772 03/26/2011   Will do next TSH at next f/u in 23mo.    FOLLOW UP: Return in about 3 months (around 10/30/2011) for f/u radiculopathy pain, HTN, COPD.

## 2011-07-30 NOTE — Assessment & Plan Note (Signed)
Problem stable.  Continue current medications and diet appropriate for this condition.  We have reviewed our general long term plan for this problem and also reviewed symptoms and signs that should prompt the patient to call or return to the office. Will check lytes/cr next f/u in 3mo. 

## 2011-07-30 NOTE — Assessment & Plan Note (Signed)
Problem stable.  Continue current medications and diet appropriate for this condition.  We have reviewed our general long term plan for this problem and also reviewed symptoms and signs that should prompt the patient to call or return to the office.  

## 2011-07-30 NOTE — Assessment & Plan Note (Signed)
With some neuropathic pain. Will do trial of neurontin (pt says she gained lots of wt on amitriptyline in the past): 300mg  qd and titrate up to 300mg  tid.

## 2011-07-30 NOTE — Assessment & Plan Note (Signed)
Lab Results  Component Value Date   TSH 0.772 03/26/2011   Will do next TSH at next f/u in 26mo.

## 2011-08-19 DIAGNOSIS — M47817 Spondylosis without myelopathy or radiculopathy, lumbosacral region: Secondary | ICD-10-CM | POA: Diagnosis not present

## 2011-08-19 DIAGNOSIS — M48061 Spinal stenosis, lumbar region without neurogenic claudication: Secondary | ICD-10-CM | POA: Diagnosis not present

## 2011-08-19 DIAGNOSIS — IMO0002 Reserved for concepts with insufficient information to code with codable children: Secondary | ICD-10-CM | POA: Diagnosis not present

## 2011-08-19 DIAGNOSIS — M1990 Unspecified osteoarthritis, unspecified site: Secondary | ICD-10-CM | POA: Diagnosis not present

## 2011-08-21 DIAGNOSIS — H02409 Unspecified ptosis of unspecified eyelid: Secondary | ICD-10-CM | POA: Diagnosis not present

## 2011-08-21 DIAGNOSIS — H538 Other visual disturbances: Secondary | ICD-10-CM | POA: Diagnosis not present

## 2011-08-21 DIAGNOSIS — H04129 Dry eye syndrome of unspecified lacrimal gland: Secondary | ICD-10-CM | POA: Diagnosis not present

## 2011-08-21 DIAGNOSIS — H2510 Age-related nuclear cataract, unspecified eye: Secondary | ICD-10-CM | POA: Diagnosis not present

## 2011-08-22 ENCOUNTER — Encounter: Payer: Self-pay | Admitting: Family Medicine

## 2011-08-22 ENCOUNTER — Ambulatory Visit (INDEPENDENT_AMBULATORY_CARE_PROVIDER_SITE_OTHER): Payer: Medicare Other | Admitting: Family Medicine

## 2011-08-22 VITALS — BP 119/76 | HR 74 | Temp 97.2°F | Ht 61.0 in | Wt 145.0 lb

## 2011-08-22 DIAGNOSIS — L293 Anogenital pruritus, unspecified: Secondary | ICD-10-CM | POA: Diagnosis not present

## 2011-08-22 DIAGNOSIS — R319 Hematuria, unspecified: Secondary | ICD-10-CM | POA: Diagnosis not present

## 2011-08-22 DIAGNOSIS — N390 Urinary tract infection, site not specified: Secondary | ICD-10-CM

## 2011-08-22 DIAGNOSIS — N898 Other specified noninflammatory disorders of vagina: Secondary | ICD-10-CM

## 2011-08-22 LAB — POCT URINALYSIS DIPSTICK
Bilirubin, UA: NEGATIVE
Glucose, UA: NEGATIVE
Ketones, UA: NEGATIVE
Nitrite, UA: NEGATIVE
Protein, UA: NEGATIVE
Spec Grav, UA: 1.01
Urobilinogen, UA: 0.2
pH, UA: 7

## 2011-08-22 MED ORDER — NITROFURANTOIN MONOHYD MACRO 100 MG PO CAPS: ORAL_CAPSULE | ORAL | Status: DC

## 2011-08-22 NOTE — Progress Notes (Signed)
OFFICE NOTE  08/22/2011  CC:  Chief Complaint  Patient presents with  . Vaginal Itching    burning, pessary in place     HPI: Patient is a 76 y.o. Caucasian female who is here for several days of burning with urination, urinary frequency, urgency, and some vaginal itching.  No vag d/c.  No gross hematuria.  Says urine looks cloudy. No abd pain, nausea, or fevers.   Pertinent PMH:  UTI 06/2011  MEDS:  Outpatient Prescriptions Prior to Visit  Medication Sig Dispense Refill  . ADVAIR DISKUS 250-50 MCG/DOSE AEPB INHALE 1 PUFF INTO THE LUNGS TWICE A DAY .  60 each  4  . albuterol (PROAIR HFA) 108 (90 BASE) MCG/ACT inhaler Inhale 2 puffs into the lungs every 4 (four) hours as needed.  1 Inhaler  5  . amLODipine (NORVASC) 5 MG tablet Take 1 tablet (5 mg total) by mouth daily.  30 tablet  5  . aspirin EC 81 MG tablet Take 1 tablet (81 mg total) by mouth daily.  150 tablet  2  . diazepam (VALIUM) 5 MG tablet Take 1 tablet (5 mg total) by mouth every 12 (twelve) hours as needed for anxiety or sleep.  90 tablet  1  . Fluticasone-Salmeterol (ADVAIR DISKUS) 250-50 MCG/DOSE AEPB       . gabapentin (NEURONTIN) 300 MG capsule Take 1 capsule (300 mg total) by mouth 3 (three) times daily.  90 capsule  3  . levothyroxine (SYNTHROID, LEVOTHROID) 88 MCG tablet Take 1 tablet (88 mcg total) by mouth daily.  90 tablet  1  . loratadine (CLARITIN) 10 MG tablet Take 10 mg by mouth as needed.        Marland Kitchen losartan (COZAAR) 100 MG tablet Take 1 tablet (100 mg total) by mouth daily.  30 tablet  2  . mometasone (NASONEX) 50 MCG/ACT nasal spray Place 2 sprays into the nose daily.  17 g  1  . omeprazole (PRILOSEC) 20 MG capsule Take 20 mg by mouth daily.        Marland Kitchen oxyCODONE-acetaminophen (PERCOCET) 5-325 MG per tablet Take 1 tablet by mouth every 6 (six) hours as needed.        . roflumilast (DALIRESP) 500 MCG TABS tablet Take 1 tablet (500 mcg total) by mouth daily.  30 tablet  5  . SPIRIVA HANDIHALER 18 MCG  inhalation capsule PLACE 1 CAPSULE (18 MCG TOTAL) INTO INHALER AND INHALE DAILY.  30 each  6  . venlafaxine (EFFEXOR) 50 MG tablet Take 1 tablet (50 mg total) by mouth 2 (two) times daily.  60 tablet  3    PE: Blood pressure 119/76, pulse 74, temperature 97.2 F (36.2 C), temperature source Temporal, height 5\' 1"  (1.549 m), weight 145 lb (65.772 kg). Gen: Alert, well appearing.  Patient is oriented to person, place, time, and situation. CV: RRR, no m/r/g.   LUNGS: CTA bilat, nonlabored resps, good aeration in all lung fields. ABD: soft, NT, ND, BS normal.  No hepatospenomegaly or mass.  No bruits.  Lab: CC UA today showed moderate LEU, small blood  IMPRESSION AND PLAN:  UTI (lower urinary tract infection) Start macrobid 100mg  bid x 5d. Send urine for c/s.      FOLLOW UP: prn

## 2011-08-25 LAB — URINE CULTURE: Colony Count: >=100000

## 2011-08-25 NOTE — Assessment & Plan Note (Signed)
Start macrobid 100mg  bid x 5d. Send urine for c/s.

## 2011-09-02 DIAGNOSIS — H2510 Age-related nuclear cataract, unspecified eye: Secondary | ICD-10-CM | POA: Diagnosis not present

## 2011-09-02 DIAGNOSIS — H269 Unspecified cataract: Secondary | ICD-10-CM | POA: Diagnosis not present

## 2011-09-05 ENCOUNTER — Other Ambulatory Visit: Payer: Self-pay | Admitting: *Deleted

## 2011-09-05 DIAGNOSIS — I10 Essential (primary) hypertension: Secondary | ICD-10-CM

## 2011-09-05 MED ORDER — LOSARTAN POTASSIUM 100 MG PO TABS: 100.0000 mg | ORAL_TABLET | Freq: Every day | ORAL | Status: DC

## 2011-09-05 NOTE — Telephone Encounter (Signed)
Faxed refill request received from pharmacy for losartan Last filled by MD on 05/30/11, #30 x 2  Last seen on 07/30/11 Follow up 10/30/11

## 2011-09-22 ENCOUNTER — Other Ambulatory Visit: Payer: Self-pay | Admitting: Family Medicine

## 2011-09-22 DIAGNOSIS — F32A Depression, unspecified: Secondary | ICD-10-CM

## 2011-09-22 DIAGNOSIS — F419 Anxiety disorder, unspecified: Secondary | ICD-10-CM

## 2011-09-22 DIAGNOSIS — F329 Major depressive disorder, single episode, unspecified: Secondary | ICD-10-CM

## 2011-09-22 MED ORDER — DIAZEPAM 5 MG PO TABS: 5.0000 mg | ORAL_TABLET | Freq: Two times a day (BID) | ORAL | Status: DC | PRN

## 2011-09-22 NOTE — Telephone Encounter (Signed)
Please advise Diazepam refill? Last RX wrote on 04-15-11 quantity 90 with 1 refill.

## 2011-10-07 MED ORDER — DIAZEPAM 5 MG PO TABS: 5.0000 mg | ORAL_TABLET | Freq: Two times a day (BID) | ORAL | Status: DC | PRN

## 2011-10-07 NOTE — Addendum Note (Signed)
Addended by: Luisa Dago on: 10/07/2011 05:01 PM   Modules accepted: Orders

## 2011-10-07 NOTE — Telephone Encounter (Signed)
Pt presented at office wanting to know if refill was completed.  Review of chart shows "no print".  RX will be faxed to [facility] after physician signs on 10/08/11.

## 2011-10-09 DIAGNOSIS — Z961 Presence of intraocular lens: Secondary | ICD-10-CM | POA: Diagnosis not present

## 2011-10-09 DIAGNOSIS — H2510 Age-related nuclear cataract, unspecified eye: Secondary | ICD-10-CM | POA: Diagnosis not present

## 2011-10-14 DIAGNOSIS — Z961 Presence of intraocular lens: Secondary | ICD-10-CM | POA: Diagnosis not present

## 2011-10-14 DIAGNOSIS — H04129 Dry eye syndrome of unspecified lacrimal gland: Secondary | ICD-10-CM | POA: Diagnosis not present

## 2011-10-14 DIAGNOSIS — H43819 Vitreous degeneration, unspecified eye: Secondary | ICD-10-CM | POA: Diagnosis not present

## 2011-10-14 DIAGNOSIS — H2510 Age-related nuclear cataract, unspecified eye: Secondary | ICD-10-CM | POA: Diagnosis not present

## 2011-10-14 DIAGNOSIS — H43399 Other vitreous opacities, unspecified eye: Secondary | ICD-10-CM | POA: Diagnosis not present

## 2011-10-16 DIAGNOSIS — M1710 Unilateral primary osteoarthritis, unspecified knee: Secondary | ICD-10-CM | POA: Diagnosis not present

## 2011-10-16 DIAGNOSIS — IMO0002 Reserved for concepts with insufficient information to code with codable children: Secondary | ICD-10-CM | POA: Diagnosis not present

## 2011-10-16 DIAGNOSIS — M47817 Spondylosis without myelopathy or radiculopathy, lumbosacral region: Secondary | ICD-10-CM | POA: Diagnosis not present

## 2011-10-16 DIAGNOSIS — M48061 Spinal stenosis, lumbar region without neurogenic claudication: Secondary | ICD-10-CM | POA: Diagnosis not present

## 2011-10-22 ENCOUNTER — Encounter (INDEPENDENT_AMBULATORY_CARE_PROVIDER_SITE_OTHER): Payer: Medicare Other | Admitting: Ophthalmology

## 2011-10-22 DIAGNOSIS — Q120 Congenital cataract: Secondary | ICD-10-CM

## 2011-10-22 DIAGNOSIS — H35039 Hypertensive retinopathy, unspecified eye: Secondary | ICD-10-CM | POA: Diagnosis not present

## 2011-10-22 DIAGNOSIS — I10 Essential (primary) hypertension: Secondary | ICD-10-CM

## 2011-10-22 DIAGNOSIS — H2700 Aphakia, unspecified eye: Secondary | ICD-10-CM

## 2011-10-22 DIAGNOSIS — H43819 Vitreous degeneration, unspecified eye: Secondary | ICD-10-CM | POA: Diagnosis not present

## 2011-10-23 ENCOUNTER — Ambulatory Visit (INDEPENDENT_AMBULATORY_CARE_PROVIDER_SITE_OTHER): Payer: Medicare Other | Admitting: *Deleted

## 2011-10-23 DIAGNOSIS — Z23 Encounter for immunization: Secondary | ICD-10-CM | POA: Diagnosis not present

## 2011-10-29 ENCOUNTER — Encounter: Payer: Self-pay | Admitting: Family Medicine

## 2011-10-29 ENCOUNTER — Ambulatory Visit (INDEPENDENT_AMBULATORY_CARE_PROVIDER_SITE_OTHER): Payer: Medicare Other | Admitting: Family Medicine

## 2011-10-29 VITALS — BP 129/72 | HR 71 | Ht 61.0 in | Wt 149.0 lb

## 2011-10-29 DIAGNOSIS — G4710 Hypersomnia, unspecified: Secondary | ICD-10-CM

## 2011-10-29 DIAGNOSIS — M4800 Spinal stenosis, site unspecified: Secondary | ICD-10-CM

## 2011-10-29 DIAGNOSIS — E039 Hypothyroidism, unspecified: Secondary | ICD-10-CM

## 2011-10-29 DIAGNOSIS — J449 Chronic obstructive pulmonary disease, unspecified: Secondary | ICD-10-CM

## 2011-10-29 DIAGNOSIS — I10 Essential (primary) hypertension: Secondary | ICD-10-CM | POA: Diagnosis not present

## 2011-10-29 DIAGNOSIS — R7309 Other abnormal glucose: Secondary | ICD-10-CM

## 2011-10-29 DIAGNOSIS — R400 Somnolence: Secondary | ICD-10-CM

## 2011-10-29 NOTE — Progress Notes (Signed)
OFFICE NOTE  10/29/2011  CC:  Chief Complaint  Patient presents with  . Follow-up    pain, HTN, COPD     HPI: Patient is a 76 y.o. Caucasian female who is here for 3 mo f/u LBP with radiculopathy, HTN, COPD. Neurontin caused too much sedation so she did not take this long after last visit.  She says she has occasional mild pain but is feeling pretty good and doesn't want to try more nerve medication.  Her breathing has been stable, no acute changes in respiratory status.  She is compliant with all her COPD meds.  She says she feels like she could sleep all day.  She does not know if she snores or has apneic periods in sleep--she and her husband sleep in separate rooms.   She has a lot of stress: working, husband chronically ill, no time off lately. Quality of sleep questionnaire done today and she scored a 6 and this is just over the cutoff to be considered "high risk" for OSA.  Pertinent PMH:  Past Medical History  Diagnosis Date  . Hypertension     EKG 03/2010 normal  . COPD (chronic obstructive pulmonary disease)     spirometry 01/10/09 FEV 0.97(52%), FEV1% 47  . Hypoxemia     O2 86% RA with exertion 01/10/09, 2 liters oxygen with exertion and sleep  . Pulmonary nodule     CT chest 12/10, June 2011.  No nodule on CT 06/2010.  Marland Kitchen Spinal stenosis     Symptomatic (neurogenic claudication)  . GERD (gastroesophageal reflux disease)   . Hypothyroidism     Hashimoto's, dx'd 1989  . Depression   . Insomnia   . Plantar fasciitis   . Diverticulosis of colon     Procto '97 and colonoscopy 2002  . DDD (degenerative disc disease), cervical     1998 MRI C5-6 impingemt (left)  . DDD (degenerative disc disease), lumbar   . Spinal stenosis of lumbar region with neurogenic claudication     2008 MRI (L3-4, L4-5, L5-S1)  . Fibrocystic breast disease     w/fibroadenoma.  Bx's showed NO atypia (Dr. Jamey Ripa).  Mammo neg 2008.  Marland Kitchen Chest pain, non-cardiac     Cardiac CT showed no signif  obstructive dz, mildly elevated calcium level (Dr. Jens Som).  . History of double vision     Ophthalmologist, Dr. Elmer Picker, is further evaluating this with MRI  orbits and limited brain (myesthenia gravis testing neg 07/2010)  . Depression with anxiety 04/15/2011    MEDS:  Outpatient Prescriptions Prior to Visit  Medication Sig Dispense Refill  . ADVAIR DISKUS 250-50 MCG/DOSE AEPB INHALE 1 PUFF INTO THE LUNGS TWICE A DAY .  60 each  4  . albuterol (PROAIR HFA) 108 (90 BASE) MCG/ACT inhaler Inhale 2 puffs into the lungs every 4 (four) hours as needed.  1 Inhaler  5  . amLODipine (NORVASC) 5 MG tablet Take 1 tablet (5 mg total) by mouth daily.  30 tablet  5  . diazepam (VALIUM) 5 MG tablet Take 1 tablet (5 mg total) by mouth every 12 (twelve) hours as needed for anxiety or sleep.  90 tablet  1  . Fluticasone-Salmeterol (ADVAIR DISKUS) 250-50 MCG/DOSE AEPB       . levothyroxine (SYNTHROID, LEVOTHROID) 88 MCG tablet Take 1 tablet (88 mcg total) by mouth daily.  90 tablet  1  . loratadine (CLARITIN) 10 MG tablet Take 10 mg by mouth as needed.        Marland Kitchen  losartan (COZAAR) 100 MG tablet Take 1 tablet (100 mg total) by mouth daily.  30 tablet  5  . mometasone (NASONEX) 50 MCG/ACT nasal spray Place 2 sprays into the nose daily.  17 g  1  . nitrofurantoin, macrocrystal-monohydrate, (MACROBID) 100 MG capsule 1 tab po bid x 10d  20 capsule  0  . omeprazole (PRILOSEC) 20 MG capsule Take 20 mg by mouth daily.        Marland Kitchen oxyCODONE-acetaminophen (PERCOCET) 5-325 MG per tablet Take 1 tablet by mouth every 6 (six) hours as needed.        . roflumilast (DALIRESP) 500 MCG TABS tablet Take 1 tablet (500 mcg total) by mouth daily.  30 tablet  5  . SPIRIVA HANDIHALER 18 MCG inhalation capsule PLACE 1 CAPSULE (18 MCG TOTAL) INTO INHALER AND INHALE DAILY.  30 each  6  . gabapentin (NEURONTIN) 300 MG capsule Take 1 capsule (300 mg total) by mouth 3 (three) times daily.  90 capsule  3    PE: Blood pressure 129/72, pulse  71, height 5\' 1"  (1.549 m), weight 149 lb (67.586 kg). Gen: Alert, well appearing.  Patient is oriented to person, place, time, and situation. CV: RRR, no m/r/g.   LUNGS: CTA bilat, nonlabored resps, good aeration in all lung fields. EXT: no clubbing, cyanosis, or edema.   IMPRESSION AND PLAN:  SPINAL STENOSIS She has chronic LBP with radiculopathy sx's BUT says that lately she has not had much pain at all. She does not want to take any new meds at this time.  HYPOTHYROIDISM Due for TSH check today.  ESSENTIAL HYPERTENSION Problem stable.  Continue current medications and diet appropriate for this condition.  We have reviewed our general long term plan for this problem and also reviewed symptoms and signs that should prompt the patient to call or return to the office. Check cr/lytes today.  COPD Problem stable.  Continue current medications and diet appropriate for this condition.  We have reviewed our general long term plan for this problem and also reviewed symptoms and signs that should prompt the patient to call or return to the office.   Daytime somnolence Her sleep quality questionnaire scored just over the cutoff for "high risk" for OSA. I recommended she go ahead and get a sleep study to r/o OSA and she was agreeable to this.   An After Visit Summary was printed and given to the patient.  FOLLOW UP: 3 mo

## 2011-10-30 ENCOUNTER — Ambulatory Visit (INDEPENDENT_AMBULATORY_CARE_PROVIDER_SITE_OTHER): Payer: Medicare Other | Admitting: Family Medicine

## 2011-10-30 ENCOUNTER — Other Ambulatory Visit: Payer: Self-pay | Admitting: Family Medicine

## 2011-10-30 DIAGNOSIS — G4710 Hypersomnia, unspecified: Secondary | ICD-10-CM

## 2011-10-30 DIAGNOSIS — E875 Hyperkalemia: Secondary | ICD-10-CM

## 2011-10-30 DIAGNOSIS — R400 Somnolence: Secondary | ICD-10-CM | POA: Insufficient documentation

## 2011-10-30 DIAGNOSIS — E162 Hypoglycemia, unspecified: Secondary | ICD-10-CM

## 2011-10-30 LAB — HEMOGLOBIN A1C: Hgb A1c MFr Bld: 5.7 % (ref 4.6–6.5)

## 2011-10-30 LAB — BASIC METABOLIC PANEL
BUN: 15 mg/dL (ref 6–23)
CO2: 18 mEq/L — ABNORMAL LOW (ref 19–32)
Calcium: 9.7 mg/dL (ref 8.4–10.5)
Chloride: 99 mEq/L (ref 96–112)
Creat: 0.88 mg/dL (ref 0.50–1.10)
Glucose, Bld: 32 mg/dL — CL (ref 70–99)
Potassium: 5.4 mEq/L — ABNORMAL HIGH (ref 3.5–5.3)
Sodium: 144 mEq/L (ref 135–145)

## 2011-10-30 LAB — TSH: TSH: 3.563 u[IU]/mL (ref 0.350–4.500)

## 2011-10-30 NOTE — Addendum Note (Signed)
Addended by: [name] R on: 10/30/2011 09:59 AM   Modules accepted: Orders

## 2011-10-30 NOTE — Assessment & Plan Note (Signed)
Her sleep quality questionnaire scored just over the cutoff for "high risk" for OSA. I recommended she go ahead and get a sleep study to r/o OSA and she was agreeable to this.

## 2011-10-30 NOTE — Assessment & Plan Note (Signed)
Problem stable.  Continue current medications and diet appropriate for this condition.  We have reviewed our general long term plan for this problem and also reviewed symptoms and signs that should prompt the patient to call or return to the office.  

## 2011-10-30 NOTE — Assessment & Plan Note (Signed)
Problem stable.  Continue current medications and diet appropriate for this condition.  We have reviewed our general long term plan for this problem and also reviewed symptoms and signs that should prompt the patient to call or return to the office. Check cr/lytes today. 

## 2011-10-30 NOTE — Assessment & Plan Note (Signed)
Due for TSH check today 

## 2011-10-30 NOTE — Progress Notes (Signed)
This is an orders only encounter-PM

## 2011-10-30 NOTE — Assessment & Plan Note (Signed)
She has chronic LBP with radiculopathy sx's BUT says that lately she has not had much pain at all. She does not want to take any new meds at this time.

## 2011-11-04 DIAGNOSIS — H2510 Age-related nuclear cataract, unspecified eye: Secondary | ICD-10-CM | POA: Diagnosis not present

## 2011-11-04 DIAGNOSIS — H269 Unspecified cataract: Secondary | ICD-10-CM | POA: Diagnosis not present

## 2011-11-06 ENCOUNTER — Other Ambulatory Visit (INDEPENDENT_AMBULATORY_CARE_PROVIDER_SITE_OTHER): Payer: Medicare Other

## 2011-11-06 DIAGNOSIS — E875 Hyperkalemia: Secondary | ICD-10-CM

## 2011-11-06 DIAGNOSIS — E162 Hypoglycemia, unspecified: Secondary | ICD-10-CM

## 2011-11-06 LAB — BASIC METABOLIC PANEL
BUN: 13 mg/dL (ref 6–23)
CO2: 25 mEq/L (ref 19–32)
Calcium: 9.1 mg/dL (ref 8.4–10.5)
Chloride: 103 mEq/L (ref 96–112)
Creatinine, Ser: 0.8 mg/dL (ref 0.4–1.2)
GFR: 76.63 mL/min (ref >60.00)
Glucose, Bld: 85 mg/dL (ref 70–99)
Potassium: 3.9 mEq/L (ref 3.5–5.1)
Sodium: 136 mEq/L (ref 135–145)

## 2011-11-07 NOTE — Progress Notes (Signed)
I gave patient results of her lab test and told her to continue monitoring her BP 3x a week.  She knows to call if BP is continuously 140/90.

## 2011-11-14 ENCOUNTER — Ambulatory Visit (HOSPITAL_BASED_OUTPATIENT_CLINIC_OR_DEPARTMENT_OTHER): Payer: Medicare Other | Attending: Family Medicine | Admitting: Sleep Medicine

## 2011-11-14 VITALS — Ht 61.0 in | Wt 145.0 lb

## 2011-11-14 DIAGNOSIS — G4710 Hypersomnia, unspecified: Secondary | ICD-10-CM | POA: Diagnosis not present

## 2011-11-14 DIAGNOSIS — R400 Somnolence: Secondary | ICD-10-CM

## 2011-11-20 ENCOUNTER — Ambulatory Visit (INDEPENDENT_AMBULATORY_CARE_PROVIDER_SITE_OTHER): Payer: Medicare Other | Admitting: Pulmonary Disease

## 2011-11-20 ENCOUNTER — Encounter: Payer: Self-pay | Admitting: Pulmonary Disease

## 2011-11-20 VITALS — BP 108/62 | HR 77 | Temp 97.5°F | Ht 61.0 in | Wt 147.0 lb

## 2011-11-20 DIAGNOSIS — R400 Somnolence: Secondary | ICD-10-CM

## 2011-11-20 DIAGNOSIS — J449 Chronic obstructive pulmonary disease, unspecified: Secondary | ICD-10-CM | POA: Diagnosis not present

## 2011-11-20 DIAGNOSIS — R0902 Hypoxemia: Secondary | ICD-10-CM | POA: Diagnosis not present

## 2011-11-20 DIAGNOSIS — G4710 Hypersomnia, unspecified: Secondary | ICD-10-CM | POA: Diagnosis not present

## 2011-11-20 NOTE — Patient Instructions (Signed)
Follow up in 6 months 

## 2011-11-20 NOTE — Progress Notes (Signed)
Chief Complaint  Patient presents with  . Follow-up    Pt states breathing has been good since last ov. no complaints today.    History of Present Illness: Priscilla Cantrell is a 76 y.o. female former smoker with GOLD 4 COPD, ACE related cough, pulmonary nodule, and hypoxemia on 2 liters oxygen with exertion and sleep.  She has noticed more trouble feeling sleepy during the day.  Her husband says that she snores.  As a result she had a sleep study 11/14/11.  She has not heard about the results yet.  Her breathing has been doing okay.  She is not having much cough.  She is keeping up with her activities w/o too much trouble with her breathing.  She had cataract surgery.  Tests: Spirometry 01/10/09>>FEV1 0.97(52%), FEV1% 47 March 2012>>Daliresp started   Past Medical History  Diagnosis Date  . Hypertension     EKG 03/2010 normal  . COPD (chronic obstructive pulmonary disease)     spirometry 01/10/09 FEV 0.97(52%), FEV1% 47  . Hypoxemia     O2 86% RA with exertion 01/10/09, 2 liters oxygen with exertion and sleep  . Pulmonary nodule     CT chest 12/10, June 2011.  No nodule on CT 06/2010.  Marland Kitchen Spinal stenosis     Symptomatic (neurogenic claudication)  . GERD (gastroesophageal reflux disease)   . Hypothyroidism     Hashimoto's, dx'd 1989  . Depression   . Insomnia   . Plantar fasciitis   . Diverticulosis of colon     Procto '97 and colonoscopy 2002  . DDD (degenerative disc disease), cervical     1998 MRI C5-6 impingemt (left)  . DDD (degenerative disc disease), lumbar   . Spinal stenosis of lumbar region with neurogenic claudication     2008 MRI (L3-4, L4-5, L5-S1)  . Fibrocystic breast disease     w/fibroadenoma.  Bx's showed NO atypia (Dr. Jamey Ripa).  Mammo neg 2008.  Marland Kitchen Chest pain, non-cardiac     Cardiac CT showed no signif obstructive dz, mildly elevated calcium level (Dr. Jens Som).  . History of double vision     Ophthalmologist, Dr. Elmer Picker, is further evaluating this with  MRI  orbits and limited brain (myesthenia gravis testing neg 07/2010)  . Depression with anxiety 04/15/2011    Past Surgical History  Procedure Date  . Cholecystectomy 2001  . Tubal ligation 1974  . Abdominal hysterectomy 1978    no BSO per pt--nonmalignant reasons  . Breast cyst excision     Last screening mammogram 10/2010 was normal.  . Appendectomy   . Tonsillectomy     Allergies  Allergen Reactions  . Ace Inhibitors     REACTION: cough  . Cefixime (ZOX:WRUEAVWU) Other (See Comments)    unspecified  . Losartan Other (See Comments)    hyperkalemia  . Penicillins     REACTION: rash, hives, swelling, welts  . Sulfa Antibiotics Other (See Comments)    unspecified    Physical Exam:  Filed Vitals:   11/20/11 1542  BP: 108/62  Pulse: 77  Temp: 97.5 F (36.4 C)  TempSrc: Oral  Height: 5\' 1"  (1.549 m)  Weight: 147 lb (66.679 kg)  SpO2: 92%    Body mass index is 27.78 kg/(m^2).  Wt Readings from Last 2 Encounters:  11/20/11 147 lb (66.679 kg)  11/14/11 145 lb (65.772 kg)    General - Thin HEENT - No sinus tenderness, no oral exudate, no lan, no thyromegaly Cardiac - s1s2 no murmur  Chest - prolonged exhalation, no wheeze/rales Abdomen - soft, nontender Extremities - no edema Skin - no rashes Neurologic - normal strength Psychiatric - normal mood, behavior   Assessment/Plan:  Outpatient Encounter Prescriptions as of 11/20/2011  Medication Sig Dispense Refill  . ADVAIR DISKUS 250-50 MCG/DOSE AEPB INHALE 1 PUFF INTO THE LUNGS TWICE A DAY .  60 each  4  . albuterol (PROAIR HFA) 108 (90 BASE) MCG/ACT inhaler Inhale 2 puffs into the lungs every 4 (four) hours as needed.  1 Inhaler  5  . amLODipine (NORVASC) 5 MG tablet Take 1 tablet (5 mg total) by mouth daily.  30 tablet  5  . diazepam (VALIUM) 5 MG tablet Take 1 tablet (5 mg total) by mouth every 12 (twelve) hours as needed for anxiety or sleep.  90 tablet  1  . levothyroxine (SYNTHROID, LEVOTHROID) 88 MCG  tablet Take 1 tablet (88 mcg total) by mouth daily.  90 tablet  1  . loratadine (CLARITIN) 10 MG tablet Take 10 mg by mouth as needed.        Marland Kitchen omeprazole (PRILOSEC) 20 MG capsule Take 20 mg by mouth daily.        Marland Kitchen oxyCODONE-acetaminophen (PERCOCET) 5-325 MG per tablet Take 0.25 tablets by mouth every 6 (six) hours as needed.       . roflumilast (DALIRESP) 500 MCG TABS tablet Take 1 tablet (500 mcg total) by mouth daily.  30 tablet  5  . SPIRIVA HANDIHALER 18 MCG inhalation capsule PLACE 1 CAPSULE (18 MCG TOTAL) INTO INHALER AND INHALE DAILY.  30 each  6  . DISCONTD: mometasone (NASONEX) 50 MCG/ACT nasal spray Place 2 sprays into the nose daily.  17 g  1  . losartan (COZAAR) 100 MG tablet Take 1 tablet (100 mg total) by mouth daily.  30 tablet  5  . DISCONTD: Fluticasone-Salmeterol (ADVAIR DISKUS) 250-50 MCG/DOSE AEPB       . DISCONTD: nitrofurantoin, macrocrystal-monohydrate, (MACROBID) 100 MG capsule 1 tab po bid x 10d  20 capsule  0    Coralyn Helling, MD [facility] Pulmonary/Critical Care 11/23/2011, 10:34 AM Pager:  [phone] After 3pm call: [phone]

## 2011-11-23 DIAGNOSIS — G4730 Sleep apnea, unspecified: Secondary | ICD-10-CM | POA: Diagnosis not present

## 2011-11-23 DIAGNOSIS — G4710 Hypersomnia, unspecified: Secondary | ICD-10-CM | POA: Diagnosis not present

## 2011-11-23 NOTE — Assessment & Plan Note (Signed)
She had sleep study earlier this month, but results are pending.

## 2011-11-23 NOTE — Assessment & Plan Note (Signed)
She is doing reasonably well.  Will continue her current regimen.  I have given her samples of advair and spiriva.

## 2011-11-23 NOTE — Assessment & Plan Note (Signed)
She is to continue oxygen at night and as needed during the day.     

## 2011-11-24 ENCOUNTER — Telehealth: Payer: Self-pay | Admitting: Family Medicine

## 2011-11-24 ENCOUNTER — Encounter: Payer: Self-pay | Admitting: Family Medicine

## 2011-11-24 ENCOUNTER — Other Ambulatory Visit: Payer: Self-pay | Admitting: Family Medicine

## 2011-11-24 DIAGNOSIS — G4761 Periodic limb movement disorder: Secondary | ICD-10-CM | POA: Insufficient documentation

## 2011-11-24 MED ORDER — PRAMIPEXOLE DIHYDROCHLORIDE 0.125 MG PO TABS: ORAL_TABLET | ORAL | Status: DC

## 2011-11-24 NOTE — Procedures (Signed)
Priscilla Cantrell, Priscilla Cantrell NO.:  [id number]  MEDICAL RECORD NO.:  [id number]          PATIENT TYPE:  OUT  LOCATION:  SLEEP CENTER                 FACILITY:  [facility]  PHYSICIAN:  Barbaraann Share, MD,FCCPDATE OF BIRTH:  04-07-37  DATE OF STUDY:  11/14/2011                           NOCTURNAL POLYSOMNOGRAM  REFERRING PHYSICIAN:  Jeoffrey Massed, MD  REFERRING PHYSICIAN:  Jeoffrey Massed, MD  INDICATION FOR STUDY:  Hypersomnia with sleep apnea.  EPWORTH SLEEPINESS SCORE:  9.  SLEEP ARCHITECTURE:  The patient had a total sleep time of 209 minutes with very little slow-wave sleep and only 19 minutes of REM.  Sleep onset latency was mildly prolonged at 42 minutes, and REM onset was normal at 76 minutes.  Sleep efficiency was poor at 56%.  RESPIRATORY DATA:  The patient was found to have 1 central apnea the entire night, giving her an apnea/hypopnea index of less than 1 event per hour.  The patient did have decreased quantity of REM, and therefore her degree of sleep apnea may be somewhat underestimated.  However, she had more than adequate time to manifest clinically significant sleep- disordered breathing.  There was loud snoring noted throughout.  OXYGEN DATA:  There was O2 desaturation as low as 86% that was transient in nature during the night.  The patient spent only 15 minutes the entire night less than 88%.  It should be noted this was done on room air, and the patient told the sleep technician that she normally wears 2 L of oxygen at night while sleeping only.  CARDIAC DATA:  No clinically significant arrhythmias were noted.  MOVEMENT/PARASOMNIA:  The patient was found to have 262 leg jerks with 7 per hour resulting in arousal or awakening.  There were no abnormal behaviors seen.  IMPRESSION/RECOMMENDATION: 1. Small numbers of events which do not meet the AHI criteria for the     obstructive sleep apnea syndrome.  The patient was noted to have     loud  snoring and would benefit from a trial of weight loss and     positional therapy. 2. Transient O2 desaturation as low as 86% on room air during the     night, and the patient only spent 15 minutes the entire night less     than 88%. 3. Large numbers of periodic limb movements with significant sleep     disruption.  This is very suspicious for a primary movement     disorder of sleep such as the restless legs syndrome, or the     periodic limb movement     syndrome.  Clinical correlation is recommended, specifically     whether the patient may benefit from a trial of a dopamine agonist.     Barbaraann Share, MD,FCCP Diplomate, American Board of Sleep Medicine    [facility]/MEDQ  D:  11/23/2011 15:29:44  T:  11/24/2011 05:05:37  Job:  [id number]

## 2011-11-24 NOTE — Telephone Encounter (Signed)
Discussed pt's sleep study results with her on phone today. She does not have OSA but does have significant limb movement disorder of sleep, caused many awakenings.  A trial of dopamine agonist for RLS/PLMD was suggested by Dr. Shelle Iron, the interpreting sleep MD.  Pt expressed understanding and we agreed that she would use her diazepam for anxiety in daytime prn but she could stop using this as a sleep aid.  Will start pramipexole 0.125mg , 1 tab qhs 2-3 hours prior to bedtime.  Discussed appropriate titration of med q5d or so, max of 4 pills qhs.  Pt to call and let me know how it goes and if it helps then we'll eventually get this med for her by mail order.-PM

## 2011-11-26 DIAGNOSIS — Z1231 Encounter for screening mammogram for malignant neoplasm of breast: Secondary | ICD-10-CM | POA: Diagnosis not present

## 2011-11-28 ENCOUNTER — Other Ambulatory Visit: Payer: Self-pay | Admitting: Family Medicine

## 2011-11-28 ENCOUNTER — Telehealth: Payer: Self-pay | Admitting: Family Medicine

## 2011-11-28 MED ORDER — GABAPENTIN ENACARBIL ER 600 MG PO TBCR: 1.0000 | EXTENDED_RELEASE_TABLET | Freq: Every evening | ORAL | Status: DC

## 2011-11-28 NOTE — Telephone Encounter (Signed)
Please advise 

## 2011-11-28 NOTE — Telephone Encounter (Signed)
I talked to pt on phone, advised her to stop mirapex.  I called in Horizant 600mg  to take instead of mirapex, but I recommended she wait to start this med until Monday, 12/01/11.

## 2011-12-03 ENCOUNTER — Other Ambulatory Visit: Payer: Self-pay | Admitting: Family Medicine

## 2011-12-03 DIAGNOSIS — E039 Hypothyroidism, unspecified: Secondary | ICD-10-CM

## 2011-12-03 DIAGNOSIS — F419 Anxiety disorder, unspecified: Secondary | ICD-10-CM

## 2011-12-03 DIAGNOSIS — F32A Depression, unspecified: Secondary | ICD-10-CM

## 2011-12-03 MED ORDER — VENLAFAXINE HCL 50 MG PO TABS: 50.0000 mg | ORAL_TABLET | Freq: Two times a day (BID) | ORAL | Status: DC

## 2011-12-03 NOTE — Telephone Encounter (Signed)
eScribe request for refill on SYNTHROID Last filled - 04/25/11, #30 X 6 Last TSH - 9.25 Refill sent per [facility] refill protocol.   Faxed refill request received from pharmacy for VENLAFAXINE Last filled by MD on 05/27/11, #60 X 3 Last seen on 10/29/11 Follow up 6 MONTHS 01/26/12 Refill sent per [facility] refill protocol.

## 2012-01-12 ENCOUNTER — Encounter: Payer: Self-pay | Admitting: Family Medicine

## 2012-01-12 ENCOUNTER — Ambulatory Visit (INDEPENDENT_AMBULATORY_CARE_PROVIDER_SITE_OTHER): Payer: Medicare Other | Admitting: Family Medicine

## 2012-01-12 VITALS — BP 125/75 | HR 68 | Temp 98.4°F | Ht 61.0 in | Wt 145.0 lb

## 2012-01-12 DIAGNOSIS — N952 Postmenopausal atrophic vaginitis: Secondary | ICD-10-CM | POA: Diagnosis not present

## 2012-01-12 DIAGNOSIS — R350 Frequency of micturition: Secondary | ICD-10-CM

## 2012-01-12 LAB — POCT URINALYSIS DIPSTICK
Bilirubin, UA: NEGATIVE
Blood, UA: NEGATIVE
Glucose, UA: NEGATIVE
Ketones, UA: NEGATIVE
Nitrite, UA: NEGATIVE
Protein, UA: NEGATIVE
Spec Grav, UA: 1.01
Urobilinogen, UA: 0.2
pH, UA: 6.5

## 2012-01-12 MED ORDER — ESTROGENS, CONJUGATED 0.625 MG/GM VA CREA: TOPICAL_CREAM | Freq: Every day | VAGINAL | Status: DC

## 2012-01-12 NOTE — Progress Notes (Signed)
OFFICE NOTE  01/12/2012  CC:  Chief Complaint  Patient presents with  . Urinary Frequency    since last week, odor not as bad today     HPI: Patient is a 76 y.o. Caucasian female who is here for urinary frequency, foul smelling odor to urine. Ten days ago or so she developed smelly urine but had no other urinary sx's.  Denies recent decrease in fluid intake but does sometimes note concentrated urine.  Last night she developed urinary frequency with discomfort on urinating, urgency.  This has all resolved today.  Some cream colored vag d/c lately but no itching.   She recalls today in the office that she had a glass of caffeinated tea last night prior to onset of her sx's (she NEVER has caffeine usually).  This likely caused her sx's of frequency/urgency last night--gone today. No fever, no flank pain, no malaise, no hematuria, no nausea.  Pertinent PMH:  Past Medical History  Diagnosis Date  . Hypertension     EKG 03/2010 normal  . COPD (chronic obstructive pulmonary disease)     spirometry 01/10/09 FEV 0.97(52%), FEV1% 47  . Hypoxemia     O2 86% RA with exertion 01/10/09, 2 liters oxygen with exertion and sleep  . Pulmonary nodule     CT chest 12/10, June 2011.  No nodule on CT 06/2010.  Marland Kitchen Spinal stenosis     Symptomatic (neurogenic claudication)  . GERD (gastroesophageal reflux disease)   . Hypothyroidism     Hashimoto's, dx'd 1989  . Depression   . Insomnia   . Plantar fasciitis   . Diverticulosis of colon     Procto '97 and colonoscopy 2002  . DDD (degenerative disc disease), cervical     1998 MRI C5-6 impingemt (left)  . DDD (degenerative disc disease), lumbar   . Spinal stenosis of lumbar region with neurogenic claudication     2008 MRI (L3-4, L4-5, L5-S1)  . Fibrocystic breast disease     w/fibroadenoma.  Bx's showed NO atypia (Dr. Jamey Ripa).  Mammo neg 2008.  Marland Kitchen Chest pain, non-cardiac     Cardiac CT showed no signif obstructive dz, mildly elevated calcium level (Dr.  Jens Som).  . History of double vision     Ophthalmologist, Dr. Elmer Picker, is further evaluating this with MRI  orbits and limited brain (myesthenia gravis testing neg 07/2010)  . Depression with anxiety 04/15/2011    MEDS:  Outpatient Prescriptions Prior to Visit  Medication Sig Dispense Refill  . ADVAIR DISKUS 250-50 MCG/DOSE AEPB INHALE 1 PUFF INTO THE LUNGS TWICE A DAY .  60 each  4  . albuterol (PROAIR HFA) 108 (90 BASE) MCG/ACT inhaler Inhale 2 puffs into the lungs every 4 (four) hours as needed.  1 Inhaler  5  . amLODipine (NORVASC) 5 MG tablet Take 1 tablet (5 mg total) by mouth daily.  30 tablet  5  . diazepam (VALIUM) 5 MG tablet Take 1 tablet (5 mg total) by mouth every 12 (twelve) hours as needed for anxiety or sleep.  90 tablet  1  . loratadine (CLARITIN) 10 MG tablet Take 10 mg by mouth as needed.        Marland Kitchen losartan (COZAAR) 100 MG tablet Take 1 tablet (100 mg total) by mouth daily.  30 tablet  5  . omeprazole (PRILOSEC) 20 MG capsule Take 20 mg by mouth daily.        Marland Kitchen oxyCODONE-acetaminophen (PERCOCET) 5-325 MG per tablet Take 0.25 tablets by mouth every  6 (six) hours as needed.       . roflumilast (DALIRESP) 500 MCG TABS tablet Take 1 tablet (500 mcg total) by mouth daily.  30 tablet  5  . SPIRIVA HANDIHALER 18 MCG inhalation capsule PLACE 1 CAPSULE (18 MCG TOTAL) INTO INHALER AND INHALE DAILY.  30 each  6  . SYNTHROID 88 MCG tablet TAKE 1 TABLET BY MOUTH EVERY DAY  30 tablet  10  . venlafaxine (EFFEXOR) 50 MG tablet Take 1 tablet (50 mg total) by mouth 2 (two) times daily.  60 tablet  3  . [DISCONTINUED] Gabapentin Enacarbil (HORIZANT) 600 MG TB24 Take 1 tablet by mouth every evening. (around 5 pm, take with food)  30 tablet  1   Last reviewed on 01/12/2012  4:04 PM by Luisa Dago, CMA  PE: Blood pressure 125/75, pulse 68, temperature 98.4 F (36.9 C), temperature source Temporal, height 5\' 1"  (1.549 m), weight 145 lb (65.772 kg). Gen: Alert, well appearing.  Patient  is oriented to person, place, time, and situation. No further exam today.  LAB: CC UA today showed trace LEU and was otherwise normal.  IMPRESSION AND PLAN:  Atrophic vaginitis I think she has some problems from this that are leading to more frequent UTIs + vaginal discomfort. Will start vaginal estrogen cream daily. Will send her urine from today for c/s.  No abx started today since all sx's gone and UA minimally abnl.   An After Visit Summary was printed and given to the patient.  FOLLOW UP: she has a routine f/u appt already scheduled.

## 2012-01-12 NOTE — Assessment & Plan Note (Signed)
I think she has some problems from this that are leading to more frequent UTIs + vaginal discomfort. Will start vaginal estrogen cream daily. Will send her urine from today for c/s.  No abx started today since all sx's gone and UA minimally abnl.

## 2012-01-15 LAB — URINE CULTURE: Colony Count: 40000

## 2012-01-23 ENCOUNTER — Other Ambulatory Visit: Payer: Self-pay | Admitting: Family Medicine

## 2012-01-23 MED ORDER — CIPROFLOXACIN HCL 500 MG PO TABS: 500.0000 mg | ORAL_TABLET | Freq: Two times a day (BID) | ORAL | Status: DC

## 2012-01-26 ENCOUNTER — Ambulatory Visit: Payer: PRIVATE HEALTH INSURANCE | Admitting: Family Medicine

## 2012-01-26 ENCOUNTER — Other Ambulatory Visit: Payer: Self-pay | Admitting: Family Medicine

## 2012-02-02 ENCOUNTER — Other Ambulatory Visit: Payer: Self-pay | Admitting: Family Medicine

## 2012-02-03 NOTE — Telephone Encounter (Signed)
eScribe request for refill on AMLODIPINE Last filled -  Last seen on - 01/12/12 RX sent per protocol

## 2012-02-09 ENCOUNTER — Ambulatory Visit: Payer: Medicare Other | Admitting: Family Medicine

## 2012-02-11 ENCOUNTER — Encounter: Payer: Self-pay | Admitting: Family Medicine

## 2012-02-11 ENCOUNTER — Other Ambulatory Visit: Payer: Self-pay | Admitting: Pulmonary Disease

## 2012-02-11 ENCOUNTER — Ambulatory Visit (INDEPENDENT_AMBULATORY_CARE_PROVIDER_SITE_OTHER): Payer: Medicare Other | Admitting: Family Medicine

## 2012-02-11 VITALS — BP 145/77 | HR 67 | Ht 61.0 in | Wt 142.0 lb

## 2012-02-11 DIAGNOSIS — R400 Somnolence: Secondary | ICD-10-CM

## 2012-02-11 DIAGNOSIS — I10 Essential (primary) hypertension: Secondary | ICD-10-CM | POA: Diagnosis not present

## 2012-02-11 DIAGNOSIS — J449 Chronic obstructive pulmonary disease, unspecified: Secondary | ICD-10-CM | POA: Diagnosis not present

## 2012-02-11 DIAGNOSIS — G4710 Hypersomnia, unspecified: Secondary | ICD-10-CM | POA: Diagnosis not present

## 2012-02-11 DIAGNOSIS — N390 Urinary tract infection, site not specified: Secondary | ICD-10-CM

## 2012-02-11 DIAGNOSIS — E039 Hypothyroidism, unspecified: Secondary | ICD-10-CM

## 2012-02-11 NOTE — Progress Notes (Signed)
OFFICE NOTE  02/11/2012  CC:  Chief Complaint  Patient presents with  . Follow-up    COPD, HTN, somlnolence     HPI: Patient is a 77 y.o. Caucasian female who is here for 3 mo f/u HTN, COPD, excessive daytime somnolence. Last f/u we started pramipexole for limb movement d/o of sleep--couldn't tolerate it.  Same with trial of long-acting gabapentin.  She has responded well to diazepam hs and sleeps well and has no more significant daytime somnolence. Has been taking 2 OTC ibuprofen bid for arthritis in LB and this is helping. Has had some itchy/dry skin and recently has had a few little red bumps come up on right arm.  OTC hydrocortisone ointment helps the itching some.  She uses lotions but not creams.  Breathing has felt fine lately: pulse ox ranges from 87-93 on RA.  Wears oxygen hs only. Says her lung condition stabilized greatly after starting daliresp last year.  Blood pressure has been normal at home. No urinary complaints today.  Abx helped her sx's last time when urine clx was low colony count.  Pertinent PMH:  Past Medical History  Diagnosis Date  . Hypertension     EKG 03/2010 normal  . COPD (chronic obstructive pulmonary disease)     spirometry 01/10/09 FEV 0.97(52%), FEV1% 47  . Hypoxemia     O2 86% RA with exertion 01/10/09, 2 liters oxygen with exertion and sleep  . Pulmonary nodule     CT chest 12/10, June 2011.  No nodule on CT 06/2010.  Marland Kitchen Spinal stenosis     Symptomatic (neurogenic claudication)  . GERD (gastroesophageal reflux disease)   . Hypothyroidism     Hashimoto's, dx'd 1989  . Depression   . Insomnia   . Plantar fasciitis   . Diverticulosis of colon     Procto '97 and colonoscopy 2002  . DDD (degenerative disc disease), cervical     1998 MRI C5-6 impingemt (left)  . DDD (degenerative disc disease), lumbar   . Spinal stenosis of lumbar region with neurogenic claudication     2008 MRI (L3-4, L4-5, L5-S1)  . Fibrocystic breast disease    w/fibroadenoma.  Bx's showed NO atypia (Dr. Jamey Ripa).  Mammo neg 2008.  Marland Kitchen Chest pain, non-cardiac     Cardiac CT showed no signif obstructive dz, mildly elevated calcium level (Dr. Jens Som).  . History of double vision     Ophthalmologist, Dr. Elmer Picker, is further evaluating this with MRI  orbits and limited brain (myesthenia gravis testing neg 07/2010)  . Depression with anxiety 04/15/2011    MEDS: **Not taking  Outpatient Prescriptions Prior to Visit  Medication Sig Dispense Refill  . ADVAIR DISKUS 250-50 MCG/DOSE AEPB INHALE 1 PUFF INTO THE LUNGS TWICE A DAY .  60 each  4  . albuterol (PROAIR HFA) 108 (90 BASE) MCG/ACT inhaler Inhale 2 puffs into the lungs every 4 (four) hours as needed.  1 Inhaler  5  . amLODipine (NORVASC) 5 MG tablet TAKE 1 TABLET (5 MG TOTAL) BY MOUTH DAILY.  30 tablet  5  . conjugated estrogens (PREMARIN) vaginal cream Place vaginally daily.  42.5 g  12  . diazepam (VALIUM) 5 MG tablet Take 1 tablet (5 mg total) by mouth every 12 (twelve) hours as needed for anxiety or sleep.  90 tablet  1  . loratadine (CLARITIN) 10 MG tablet Take 10 mg by mouth as needed.        Marland Kitchen omeprazole (PRILOSEC) 20 MG capsule Take 20  mg by mouth daily.        Marland Kitchen oxyCODONE-acetaminophen (PERCOCET) 5-325 MG per tablet Take 0.25 tablets by mouth every 6 (six) hours as needed.       . roflumilast (DALIRESP) 500 MCG TABS tablet Take 1 tablet (500 mcg total) by mouth daily.  30 tablet  5  . SPIRIVA HANDIHALER 18 MCG inhalation capsule PLACE 1 CAPSULE (18 MCG TOTAL) INTO INHALER AND INHALE DAILY.  30 each  6  . SYNTHROID 88 MCG tablet TAKE 1 TABLET BY MOUTH EVERY DAY  30 tablet  10  . venlafaxine (EFFEXOR) 50 MG tablet Take 1 tablet (50 mg total) by mouth 2 (two) times daily.  60 tablet  3  . [DISCONTINUED] amLODipine (NORVASC) 5 MG tablet TAKE 1 TABLET (5 MG TOTAL) BY MOUTH DAILY.  30 tablet  5  . [DISCONTINUED] ciprofloxacin (CIPRO) 500 MG tablet Take 1 tablet (500 mg total) by mouth 2 (two) times  daily.  10 tablet  0  . [DISCONTINUED] losartan (COZAAR) 100 MG tablet Take 1 tablet (100 mg total) by mouth daily.  30 tablet  5  Last reviewed on 02/11/2012  2:28 PM by Jeoffrey Massed, MD  PE: Blood pressure 145/77, pulse 67, height 5\' 1"  (1.549 m), weight 142 lb (64.411 kg). Gen: Alert, well appearing.  Patient is oriented to person, place, time, and situation. AFFECT: pleasant.  Displays lucid thought and speech. CV: RRR, distant S1 and S2.  No ectopy or audible murmur, rub, or gallop. PULM: CTA bilat except a trace amount of early insp soft crackles in both bases--c/w atelectasis.  Decent aeration.  Nonlabored resps.  IMPRESSION AND PLAN:  ESSENTIAL HYPERTENSION Problem stable.  Continue current medications and diet appropriate for this condition.  We have reviewed our general long term plan for this problem and also reviewed symptoms and signs that should prompt the patient to call or return to the office.   Daytime somnolence Resolved since she has been able to get good night-time sleep with use of diazepam.  COPD Problem stable.  Continue current medications and diet appropriate for this condition.  We have reviewed our general long term plan for this problem and also reviewed symptoms and signs that should prompt the patient to call or return to the office.   HYPOTHYROIDISM Next TSH is due at f/u in office in 569mo.  UTI (lower urinary tract infection) Resolved with abx last visit. These have been somewhat recurrent. I rx'd vaginal estrogen cream last visit with the thought that some of her sx's may be related to vaginal atrophy (plus this condition may lead to increased risk of UTIs), but it seems she has not started this yet.   An After Visit Summary was printed and given to the patient.  FOLLOW UP: 569mo

## 2012-02-14 NOTE — Assessment & Plan Note (Addendum)
Resolved since she has been able to get good night-time sleep with use of diazepam.

## 2012-02-14 NOTE — Assessment & Plan Note (Signed)
Problem stable.  Continue current medications and diet appropriate for this condition.  We have reviewed our general long term plan for this problem and also reviewed symptoms and signs that should prompt the patient to call or return to the office.  

## 2012-02-14 NOTE — Assessment & Plan Note (Signed)
Next TSH is due at f/u in office in 70mo.

## 2012-02-14 NOTE — Assessment & Plan Note (Signed)
Resolved with abx last visit. These have been somewhat recurrent. I rx'd vaginal estrogen cream last visit with the thought that some of her sx's may be related to vaginal atrophy (plus this condition may lead to increased risk of UTIs), but it seems she has not started this yet.

## 2012-02-17 ENCOUNTER — Other Ambulatory Visit: Payer: Self-pay | Admitting: Family Medicine

## 2012-02-17 MED ORDER — ALBUTEROL SULFATE HFA 108 (90 BASE) MCG/ACT IN AERS: 2.0000 | INHALATION_SPRAY | RESPIRATORY_TRACT | Status: DC | PRN

## 2012-02-17 NOTE — Telephone Encounter (Signed)
Refill request for ALBUTEROL INHALER Last filled-09/17/10, #1 X 5 Last seen- 02/11/12 Follow up - 4 MONTHS RX SENT.

## 2012-03-12 ENCOUNTER — Ambulatory Visit: Payer: Medicare Other | Admitting: Cardiology

## 2012-04-02 ENCOUNTER — Encounter: Payer: Self-pay | Admitting: Cardiology

## 2012-04-02 ENCOUNTER — Ambulatory Visit (INDEPENDENT_AMBULATORY_CARE_PROVIDER_SITE_OTHER): Payer: Medicare Other | Admitting: Cardiology

## 2012-04-02 VITALS — BP 120/80 | HR 66 | Wt 145.0 lb

## 2012-04-02 DIAGNOSIS — E7800 Pure hypercholesterolemia, unspecified: Secondary | ICD-10-CM | POA: Diagnosis not present

## 2012-04-02 DIAGNOSIS — I10 Essential (primary) hypertension: Secondary | ICD-10-CM | POA: Diagnosis not present

## 2012-04-02 DIAGNOSIS — R079 Chest pain, unspecified: Secondary | ICD-10-CM | POA: Diagnosis not present

## 2012-04-02 NOTE — Patient Instructions (Addendum)
Your physician recommends that you schedule a follow-up appointment as needed  

## 2012-04-02 NOTE — Assessment & Plan Note (Signed)
Blood pressure controlled. Continue present medications. 

## 2012-04-02 NOTE — Assessment & Plan Note (Signed)
Present symptoms are most consistent with reflux. No plans for further ischemia evaluation. Continue aspirin. She did not tolerate statin.

## 2012-04-02 NOTE — Progress Notes (Signed)
HPI: Pleasant female I initially saw in May of 2012 for evaluation of chest pain. Cardiac CTA performed as part of Promise study; nonobstructive plaque; calcium score of 69; emphysema noted. I last saw her in Jan 2013. Since then, she has dyspnea with more moderate activities but not routine activities. No orthopnea, PND, pedal edema or syncope. She occasionally has chest discomfort after drinking or swallowing food. There is associated water brash. She does not have exertional chest pain.   Current Outpatient Prescriptions  Medication Sig Dispense Refill  . ADVAIR DISKUS 250-50 MCG/DOSE AEPB INHALE 1 PUFF BY MOUTH TWICE A DAY  60 each  6  . albuterol (PROAIR HFA) 108 (90 BASE) MCG/ACT inhaler Inhale 2 puffs into the lungs every 4 (four) hours as needed.  1 Inhaler  5  . amLODipine (NORVASC) 5 MG tablet Take 5 mg by mouth daily.      Marland Kitchen conjugated estrogens (PREMARIN) vaginal cream Place vaginally daily.  42.5 g  12  . diazepam (VALIUM) 5 MG tablet Take 1 tablet (5 mg total) by mouth every 12 (twelve) hours as needed for anxiety or sleep.  90 tablet  1  . loratadine (CLARITIN) 10 MG tablet Take 10 mg by mouth as needed.        Marland Kitchen omeprazole (PRILOSEC) 20 MG capsule Take 20 mg by mouth daily.        Marland Kitchen oxyCODONE-acetaminophen (PERCOCET) 5-325 MG per tablet Take 0.25 tablets by mouth every 6 (six) hours as needed.       . roflumilast (DALIRESP) 500 MCG TABS tablet Take 1 tablet (500 mcg total) by mouth daily.  30 tablet  5  . SPIRIVA HANDIHALER 18 MCG inhalation capsule PLACE 1 CAPSULE (18 MCG TOTAL) INTO INHALER AND INHALE DAILY.  30 each  6  . SYNTHROID 88 MCG tablet TAKE 1 TABLET BY MOUTH EVERY DAY  30 tablet  10   No current facility-administered medications for this visit.     Past Medical History  Diagnosis Date  . Hypertension     EKG 03/2010 normal  . COPD (chronic obstructive pulmonary disease)     spirometry 01/10/09 FEV 0.97(52%), FEV1% 47  . Hypoxemia     O2 86% RA with exertion  01/10/09, 2 liters oxygen with exertion and sleep  . Pulmonary nodule     CT chest 12/10, June 2011.  No nodule on CT 06/2010.  Marland Kitchen Spinal stenosis     Symptomatic (neurogenic claudication)  . GERD (gastroesophageal reflux disease)   . Hypothyroidism     Hashimoto's, dx'd 1989  . Depression   . Insomnia   . Plantar fasciitis   . Diverticulosis of colon     Procto '97 and colonoscopy 2002  . DDD (degenerative disc disease), cervical     1998 MRI C5-6 impingemt (left)  . DDD (degenerative disc disease), lumbar   . Spinal stenosis of lumbar region with neurogenic claudication     2008 MRI (L3-4, L4-5, L5-S1)  . Fibrocystic breast disease     w/fibroadenoma.  Bx's showed NO atypia (Dr. Jamey Ripa).  Mammo neg 2008.  Marland Kitchen Chest pain, non-cardiac     Cardiac CT showed no signif obstructive dz, mildly elevated calcium level (Dr. Jens Som).  . History of double vision     Ophthalmologist, Dr. Elmer Picker, is further evaluating this with MRI  orbits and limited brain (myesthenia gravis testing neg 07/2010)  . Depression with anxiety 04/15/2011    Past Surgical History  Procedure Laterality Date  .  Cholecystectomy  2001  . Tubal ligation  1974  . Abdominal hysterectomy  1978    no BSO per pt--nonmalignant reasons  . Breast cyst excision      Last screening mammogram 10/2010 was normal.  . Appendectomy    . Tonsillectomy      History   Social History  . Marital Status: Married    Spouse Name: N/A    Number of Children: 1  . Years of Education: N/A   Occupational History  . SECRETARY    Social History Main Topics  . Smoking status: Former Smoker -- 2.00 packs/day for 35 years    Types: Cigarettes    Quit date: 02/03/1990  . Smokeless tobacco: Never Used  . Alcohol Use: Yes     Comment: Rarely  . Drug Use: No  . Sexually Active: No   Other Topics Concern  . Not on file   Social History Narrative   Married, lives in [place], [place].  Works as Radiographer, therapeutic.      ROS: no fevers  or chills, productive cough, hemoptysis, dysphasia, odynophagia, melena, hematochezia, dysuria, hematuria, rash, seizure activity, orthopnea, PND, pedal edema, claudication. Remaining systems are negative.  Physical Exam: Well-developed well-nourished in no acute distress.  Skin is warm and dry.  HEENT is normal.  Neck is supple.  Chest is clear to auscultation with normal expansion.  Cardiovascular exam is regular rate and rhythm.  Abdominal exam nontender or distended. No masses palpated. Extremities show no edema. neuro grossly intact  ECG sinus rhythm at a rate of 66. No ST changes.

## 2012-04-14 DIAGNOSIS — M47817 Spondylosis without myelopathy or radiculopathy, lumbosacral region: Secondary | ICD-10-CM | POA: Diagnosis not present

## 2012-04-14 DIAGNOSIS — IMO0002 Reserved for concepts with insufficient information to code with codable children: Secondary | ICD-10-CM | POA: Diagnosis not present

## 2012-04-14 DIAGNOSIS — M48061 Spinal stenosis, lumbar region without neurogenic claudication: Secondary | ICD-10-CM | POA: Diagnosis not present

## 2012-04-28 ENCOUNTER — Other Ambulatory Visit: Payer: Self-pay | Admitting: Family Medicine

## 2012-04-28 DIAGNOSIS — F32A Depression, unspecified: Secondary | ICD-10-CM

## 2012-04-28 DIAGNOSIS — F419 Anxiety disorder, unspecified: Secondary | ICD-10-CM

## 2012-04-28 MED ORDER — DIAZEPAM 5 MG PO TABS: 5.0000 mg | ORAL_TABLET | Freq: Two times a day (BID) | ORAL | Status: DC | PRN

## 2012-05-03 ENCOUNTER — Encounter: Payer: Self-pay | Admitting: Family Medicine

## 2012-05-03 ENCOUNTER — Ambulatory Visit (INDEPENDENT_AMBULATORY_CARE_PROVIDER_SITE_OTHER): Payer: Medicare Other | Admitting: Family Medicine

## 2012-05-03 VITALS — BP 106/68 | HR 96 | Temp 98.1°F | Ht 61.0 in | Wt 138.0 lb

## 2012-05-03 DIAGNOSIS — J069 Acute upper respiratory infection, unspecified: Secondary | ICD-10-CM | POA: Diagnosis not present

## 2012-05-03 DIAGNOSIS — J441 Chronic obstructive pulmonary disease with (acute) exacerbation: Secondary | ICD-10-CM | POA: Diagnosis not present

## 2012-05-03 MED ORDER — IPRATROPIUM BROMIDE 0.03 % NA SOLN: NASAL | Status: DC

## 2012-05-03 MED ORDER — PREDNISONE 20 MG PO TABS: ORAL_TABLET | ORAL | Status: DC

## 2012-05-03 MED ORDER — AZITHROMYCIN 250 MG PO TABS: ORAL_TABLET | ORAL | Status: DC

## 2012-05-03 NOTE — Progress Notes (Signed)
OFFICE NOTE  05/03/2012  CC:  Chief Complaint  Patient presents with  . Cough    congestion, fever; began Wednesday or Thursday     HPI: Patient is a 77 y.o. Caucasian female who is here for respiratory complaints. Onset 5-6 days ago, sniffles that progressed to profuse clear rhinorrhea with PND.  Cough and wheezing and subjective fever last 2d, +HA.  NO SOB at rest but mod SOB with walking around today.  Hasn't worn her oxygen in daytime any but wears 2L at night routinely.  Appetite and energy level down.  Trying some nasal saline spray and mucinex in addition to her chronic meds.  Used her albuterol inhaler this morning as well as several times per day the last couple of days.  Pertinent PMH:  Past Medical History  Diagnosis Date  . Hypertension     EKG 03/2010 normal  . COPD (chronic obstructive pulmonary disease)     spirometry 01/10/09 FEV 0.97(52%), FEV1% 47  . Hypoxemia     O2 86% RA with exertion 01/10/09, 2 liters oxygen with exertion and sleep  . Pulmonary nodule     CT chest 12/10, June 2011.  No nodule on CT 06/2010.  Marland Kitchen Spinal stenosis     Symptomatic (neurogenic claudication)  . GERD (gastroesophageal reflux disease)   . Hypothyroidism     Hashimoto's, dx'd 1989  . Depression   . Insomnia   . Plantar fasciitis   . Diverticulosis of colon     Procto '97 and colonoscopy 2002  . DDD (degenerative disc disease), cervical     1998 MRI C5-6 impingemt (left)  . DDD (degenerative disc disease), lumbar   . Spinal stenosis of lumbar region with neurogenic claudication     2008 MRI (L3-4, L4-5, L5-S1)  . Fibrocystic breast disease     w/fibroadenoma.  Bx's showed NO atypia (Dr. Jamey Ripa).  Mammo neg 2008.  Marland Kitchen Chest pain, non-cardiac     Cardiac CT showed no signif obstructive dz, mildly elevated calcium level (Dr. Jens Som).  . History of double vision     Ophthalmologist, Dr. Elmer Picker, is further evaluating this with MRI  orbits and limited brain (myesthenia gravis testing  neg 07/2010)  . Depression with anxiety 04/15/2011   Past surgical, social, and family history reviewed and no changes noted since last office visit.  MEDS:  Outpatient Prescriptions Prior to Visit  Medication Sig Dispense Refill  . ADVAIR DISKUS 250-50 MCG/DOSE AEPB INHALE 1 PUFF BY MOUTH TWICE A DAY  60 each  6  . albuterol (PROAIR HFA) 108 (90 BASE) MCG/ACT inhaler Inhale 2 puffs into the lungs every 4 (four) hours as needed.  1 Inhaler  5  . amLODipine (NORVASC) 5 MG tablet Take 5 mg by mouth daily.      Marland Kitchen conjugated estrogens (PREMARIN) vaginal cream Place vaginally daily.  42.5 g  12  . diazepam (VALIUM) 5 MG tablet Take 1 tablet (5 mg total) by mouth every 12 (twelve) hours as needed for anxiety or sleep.  90 tablet  3  . loratadine (CLARITIN) 10 MG tablet Take 10 mg by mouth as needed.        Marland Kitchen omeprazole (PRILOSEC) 20 MG capsule Take 20 mg by mouth daily.        Marland Kitchen oxyCODONE-acetaminophen (PERCOCET) 5-325 MG per tablet Take 0.25 tablets by mouth every 6 (six) hours as needed.       . roflumilast (DALIRESP) 500 MCG TABS tablet Take 1 tablet (500 mcg total)  by mouth daily.  30 tablet  5  . SPIRIVA HANDIHALER 18 MCG inhalation capsule PLACE 1 CAPSULE (18 MCG TOTAL) INTO INHALER AND INHALE DAILY.  30 each  6  . SYNTHROID 88 MCG tablet TAKE 1 TABLET BY MOUTH EVERY DAY  30 tablet  10   No facility-administered medications prior to visit.    PE: Blood pressure 106/68, pulse 96, temperature 98.1 F (36.7 C), temperature source Temporal, height 5\' 1"  (1.549 m), weight 138 lb (62.596 kg), SpO2 87.00%. VS: noted--normal. Gen: alert, NAD, NONTOXIC APPEARING. HEENT: eyes without injection, drainage, or swelling.  Ears: EACs clear, TMs with normal light reflex and landmarks.  Nose: Clear rhinorrhea, with some dried, crusty exudate adherent to mildly injected mucosa.  No purulent d/c.  No paranasal sinus TTP.  No facial swelling.  Throat and mouth without focal lesion.  No pharyngial swelling,  erythema, or exudate.   Neck: supple, no LAD.   LUNGS: CTA bilat, nonlabored resps.   CV: RRR, no m/r/g. EXT: no c/c/e SKIN: no rash  IMPRESSION AND PLAN:  URI with acute exac of COPD: ipratropium nasal spray0.3% for runny nose/PND. Zpack and mucinex DM. Prednisone 40mg  qd x 5d. Oxygen 2L daytime and night-time until back to baseline. Signs/symptoms to call or return for were reviewed and pt expressed understanding.  An After Visit Summary was printed and given to the patient.  FOLLOW UP: 4d

## 2012-05-04 ENCOUNTER — Encounter: Payer: Self-pay | Admitting: Family Medicine

## 2012-05-04 ENCOUNTER — Other Ambulatory Visit: Payer: Self-pay | Admitting: Pulmonary Disease

## 2012-05-07 ENCOUNTER — Encounter: Payer: Self-pay | Admitting: Family Medicine

## 2012-05-07 ENCOUNTER — Ambulatory Visit (INDEPENDENT_AMBULATORY_CARE_PROVIDER_SITE_OTHER): Payer: Medicare Other | Admitting: Family Medicine

## 2012-05-07 VITALS — BP 130/70 | HR 78 | Ht 61.0 in | Wt 139.0 lb

## 2012-05-07 DIAGNOSIS — J069 Acute upper respiratory infection, unspecified: Secondary | ICD-10-CM

## 2012-05-07 DIAGNOSIS — J441 Chronic obstructive pulmonary disease with (acute) exacerbation: Secondary | ICD-10-CM | POA: Diagnosis not present

## 2012-05-07 NOTE — Progress Notes (Signed)
OFFICE NOTE  05/07/2012  CC:  Chief Complaint  Patient presents with  . Follow-up    URI     HPI: Patient is a 77 y.o. Caucasian female who is here for 4d f/u URI with COPD exacerbation. On prednisone, azith, nebs, symptomatic URI meds + mucinex DM. Feeling better, nasal/sinus congestion breaking up. She is using albut less often.  Oxygen level at home on RA anywhere from 96% at rest to 88% with household activity (RA).  Pertinent PMH:  Past Medical History  Diagnosis Date  . Hypertension     EKG 03/2010 normal  . COPD (chronic obstructive pulmonary disease)     spirometry 01/10/09 FEV 0.97(52%), FEV1% 47  . Hypoxemia     O2 86% RA with exertion 01/10/09, 2 liters oxygen with exertion and sleep  . Pulmonary nodule     CT chest 12/10, June 2011.  No nodule on CT 06/2010.  Marland Kitchen Spinal stenosis     Symptomatic (neurogenic claudication)  . GERD (gastroesophageal reflux disease)   . Hypothyroidism     Hashimoto's, dx'd 1989  . Depression   . Insomnia   . Plantar fasciitis   . Diverticulosis of colon     Procto '97 and colonoscopy 2002  . DDD (degenerative disc disease), cervical     1998 MRI C5-6 impingemt (left)  . DDD (degenerative disc disease), lumbar   . Spinal stenosis of lumbar region with neurogenic claudication     2008 MRI (L3-4, L4-5, L5-S1)  . Fibrocystic breast disease     w/fibroadenoma.  Bx's showed NO atypia (Dr. Jamey Ripa).  Mammo neg 2008.  Marland Kitchen Chest pain, non-cardiac     Cardiac CT showed no signif obstructive dz, mildly elevated calcium level (Dr. Jens Som).  . History of double vision     Ophthalmologist, Dr. Elmer Picker, is further evaluating this with MRI  orbits and limited brain (myesthenia gravis testing neg 07/2010)  . Depression with anxiety 04/15/2011    MEDS:  Outpatient Prescriptions Prior to Visit  Medication Sig Dispense Refill  . ADVAIR DISKUS 250-50 MCG/DOSE AEPB INHALE 1 PUFF BY MOUTH TWICE A DAY  60 each  6  . albuterol (PROAIR HFA) 108 (90 BASE)  MCG/ACT inhaler Inhale 2 puffs into the lungs every 4 (four) hours as needed.  1 Inhaler  5  . amLODipine (NORVASC) 5 MG tablet Take 5 mg by mouth daily.      Marland Kitchen azithromycin (ZITHROMAX) 250 MG tablet 2 tabs po qd x 1d, then 1 tab po qd x 4d  6 each  0  . conjugated estrogens (PREMARIN) vaginal cream Place vaginally daily.  42.5 g  12  . diazepam (VALIUM) 5 MG tablet Take 1 tablet (5 mg total) by mouth every 12 (twelve) hours as needed for anxiety or sleep.  90 tablet  3  . ipratropium (ATROVENT) 0.03 % nasal spray 2 sprays into each nostril q12h prn  30 mL  1  . loratadine (CLARITIN) 10 MG tablet Take 10 mg by mouth as needed.        Marland Kitchen omeprazole (PRILOSEC) 20 MG capsule Take 20 mg by mouth daily.        Marland Kitchen oxyCODONE-acetaminophen (PERCOCET) 5-325 MG per tablet Take 0.25 tablets by mouth every 6 (six) hours as needed.       . predniSONE (DELTASONE) 20 MG tablet 2 tabs po qd x 5d  10 tablet  0  . roflumilast (DALIRESP) 500 MCG TABS tablet Take 1 tablet (500 mcg total) by  mouth daily.  30 tablet  5  . SPIRIVA HANDIHALER 18 MCG inhalation capsule PLACE 1 CAPSULE (18 MCG TOTAL) INTO INHALER AND INHALE DAILY.  30 capsule  4  . SYNTHROID 88 MCG tablet TAKE 1 TABLET BY MOUTH EVERY DAY  30 tablet  10   No facility-administered medications prior to visit.    PE: Blood pressure 130/70, pulse 78, height 5\' 1"  (1.549 m), weight 139 lb (63.05 kg), SpO2 98.00%. Gen: Alert, well appearing.  Patient is oriented to person, place, time, and situation. ENT:  Eyes: no injection, icteris, swelling, or exudate.  EOMI, PERRLA. Nose: no drainage or turbinate edema/swelling.  No injection or focal lesion.  Mouth: lips without lesion/swelling.  Oral mucosa pink and moist.  Oropharynx without erythema, exudate, or swelling.  Neck: supple/nontender.  No LAD, mass, or TM. CV: RRR LUNGS: Rare insp rhonchi, trace end exp wheeze with mildly prolonged exp phase, nonlabored resps.   IMPRESSION AND PLAN:  URI with COPD  exacerbation. Finishes steroids today--she is appropriately improved.   We discussed her stress lately with her own illnesses and her husband's illnesses and I recommended she REST more and also try taking her diazepam 5mg  qAM as well as her bedtime dose.  FOLLOW UP: she has appt scheduled for next mo already (prev scheduled routine f/u).

## 2012-05-20 ENCOUNTER — Telehealth: Payer: Self-pay | Admitting: *Deleted

## 2012-05-20 MED ORDER — HYDROCODONE-HOMATROPINE 5-1.5 MG/5ML PO SYRP: 5.0000 mL | ORAL_SOLUTION | Freq: Four times a day (QID) | ORAL | Status: DC | PRN

## 2012-05-20 NOTE — Telephone Encounter (Signed)
Fax script [facility]pharmacy

## 2012-05-31 ENCOUNTER — Other Ambulatory Visit: Payer: Self-pay | Admitting: Pulmonary Disease

## 2012-05-31 DIAGNOSIS — Z961 Presence of intraocular lens: Secondary | ICD-10-CM | POA: Diagnosis not present

## 2012-05-31 DIAGNOSIS — H43819 Vitreous degeneration, unspecified eye: Secondary | ICD-10-CM | POA: Diagnosis not present

## 2012-05-31 DIAGNOSIS — H26499 Other secondary cataract, unspecified eye: Secondary | ICD-10-CM | POA: Diagnosis not present

## 2012-05-31 DIAGNOSIS — H04129 Dry eye syndrome of unspecified lacrimal gland: Secondary | ICD-10-CM | POA: Diagnosis not present

## 2012-06-03 ENCOUNTER — Other Ambulatory Visit: Payer: Self-pay | Admitting: Family Medicine

## 2012-06-04 NOTE — Telephone Encounter (Signed)
Refill request for Effexor Last filled-12/03/11, #60 x3 Last seen-05/07/12 Follow up -06/11/12 Please advise refill?

## 2012-06-08 ENCOUNTER — Other Ambulatory Visit: Payer: Self-pay | Admitting: Family Medicine

## 2012-06-10 ENCOUNTER — Ambulatory Visit: Payer: Medicare Other | Admitting: Family Medicine

## 2012-06-11 ENCOUNTER — Ambulatory Visit (INDEPENDENT_AMBULATORY_CARE_PROVIDER_SITE_OTHER): Payer: Medicare Other | Admitting: Family Medicine

## 2012-06-11 ENCOUNTER — Encounter: Payer: Self-pay | Admitting: Family Medicine

## 2012-06-11 VITALS — BP 108/72 | HR 86 | Temp 97.4°F | Resp 14 | Wt 140.0 lb

## 2012-06-11 DIAGNOSIS — Z862 Personal history of diseases of the blood and blood-forming organs and certain disorders involving the immune mechanism: Secondary | ICD-10-CM

## 2012-06-11 DIAGNOSIS — Z8639 Personal history of other endocrine, nutritional and metabolic disease: Secondary | ICD-10-CM | POA: Diagnosis not present

## 2012-06-11 DIAGNOSIS — J449 Chronic obstructive pulmonary disease, unspecified: Secondary | ICD-10-CM

## 2012-06-11 DIAGNOSIS — E039 Hypothyroidism, unspecified: Secondary | ICD-10-CM

## 2012-06-11 DIAGNOSIS — J4489 Other specified chronic obstructive pulmonary disease: Secondary | ICD-10-CM | POA: Diagnosis not present

## 2012-06-11 DIAGNOSIS — I10 Essential (primary) hypertension: Secondary | ICD-10-CM

## 2012-06-11 LAB — BASIC METABOLIC PANEL
BUN: 13 mg/dL (ref 6–23)
CO2: 21 mEq/L (ref 19–32)
Calcium: 9.6 mg/dL (ref 8.4–10.5)
Chloride: 104 mEq/L (ref 96–112)
Creat: 0.81 mg/dL (ref 0.50–1.10)
Glucose, Bld: 89 mg/dL (ref 70–99)
Potassium: 4.3 mEq/L (ref 3.5–5.3)
Sodium: 139 mEq/L (ref 135–145)

## 2012-06-11 LAB — TSH: TSH: 1.016 u[IU]/mL (ref 0.350–4.500)

## 2012-06-11 NOTE — Progress Notes (Signed)
OFFICE NOTE  06/11/2012  CC:  Chief Complaint  Patient presents with  . Follow-up    4-mth. [HTN; Recurrent UTIs; COPD]     HPI: Patient is a 77 y.o. Caucasian female who is here for routine f/u for HTN, hypothyroidism, COPD, anxiety/depression, polypharmacy.  She is hanging in there despite excessive chronic stress, mainly to do with her ill husband and then recent family discord (daughter with marriage problems). Says cough is about the same as always-"still coughing some".  No persistent SOB, no CP. She is compliant with meds, says that despite her struggles lately with stress she wishes to keep her current treatment for this the same..  Pertinent PMH:  Past Medical History  Diagnosis Date  . Hypertension     EKG 03/2010 normal  . COPD (chronic obstructive pulmonary disease)     spirometry 01/10/09 FEV 0.97(52%), FEV1% 47  . Hypoxemia     O2 86% RA with exertion 01/10/09, 2 liters oxygen with exertion and sleep  . Pulmonary nodule     CT chest 12/10, June 2011.  No nodule on CT 06/2010.  Marland Kitchen Spinal stenosis     Symptomatic (neurogenic claudication)  . GERD (gastroesophageal reflux disease)   . Hypothyroidism     Hashimoto's, dx'd 1989  . Depression   . Insomnia   . Plantar fasciitis   . Diverticulosis of colon     Procto '97 and colonoscopy 2002  . DDD (degenerative disc disease), cervical     1998 MRI C5-6 impingemt (left)  . DDD (degenerative disc disease), lumbar   . Spinal stenosis of lumbar region with neurogenic claudication     2008 MRI (L3-4, L4-5, L5-S1)  . Fibrocystic breast disease     w/fibroadenoma.  Bx's showed NO atypia (Dr. Jamey Ripa).  Mammo neg 2008.  Marland Kitchen Chest pain, non-cardiac     Cardiac CT showed no signif obstructive dz, mildly elevated calcium level (Dr. Jens Som).  . History of double vision     Ophthalmologist, Dr. Elmer Picker, is further evaluating this with MRI  orbits and limited brain (myesthenia gravis testing neg 07/2010)  . Depression with anxiety  04/15/2011   Past surgical, social, and family history reviewed and no changes noted since last office visit.  MEDS:  Outpatient Prescriptions Prior to Visit  Medication Sig Dispense Refill  . ADVAIR DISKUS 250-50 MCG/DOSE AEPB INHALE 1 PUFF BY MOUTH TWICE A DAY  60 each  6  . albuterol (PROAIR HFA) 108 (90 BASE) MCG/ACT inhaler Inhale 2 puffs into the lungs every 4 (four) hours as needed.  1 Inhaler  5  . amLODipine (NORVASC) 5 MG tablet Take 5 mg by mouth daily.      Marland Kitchen conjugated estrogens (PREMARIN) vaginal cream Place vaginally daily.  42.5 g  12  . DALIRESP 500 MCG TABS tablet TAKE 1 TABLET (500 MCG TOTAL) BY MOUTH DAILY.  30 tablet  5  . diazepam (VALIUM) 5 MG tablet Take 1 tablet (5 mg total) by mouth every 12 (twelve) hours as needed for anxiety or sleep.  90 tablet  3  . HYDROcodone-homatropine (HYCODAN) 5-1.5 MG/5ML syrup Take 5 mLs by mouth every 6 (six) hours as needed.  120 mL  0  . ipratropium (ATROVENT) 0.03 % nasal spray 2 sprays into each nostril q12h prn  30 mL  1  . loratadine (CLARITIN) 10 MG tablet Take 10 mg by mouth as needed.        Marland Kitchen omeprazole (PRILOSEC) 20 MG capsule Take 20 mg  by mouth daily.        Marland Kitchen oxyCODONE-acetaminophen (PERCOCET) 5-325 MG per tablet Take 0.25 tablets by mouth every 6 (six) hours as needed.       . predniSONE (DELTASONE) 20 MG tablet 2 tabs po qd x 5d  10 tablet  0  . SPIRIVA HANDIHALER 18 MCG inhalation capsule PLACE 1 CAPSULE (18 MCG TOTAL) INTO INHALER AND INHALE DAILY.  30 capsule  4  . SYNTHROID 88 MCG tablet TAKE 1 TABLET BY MOUTH EVERY DAY  30 tablet  10  . venlafaxine (EFFEXOR) 50 MG tablet Take 1 tablet (50 mg total) by mouth 2 (two) times daily.  60 tablet  6  . azithromycin (ZITHROMAX) 250 MG tablet 2 tabs po qd x 1d, then 1 tab po qd x 4d  6 each  0   No facility-administered medications prior to visit.  *She is not taking zithromax or prednisone as listed above.  PE: Blood pressure 108/72, pulse 86, temperature 97.4 F (36.3  C), temperature source Oral, resp. rate 14, weight 140 lb (63.504 kg), SpO2 95.00%. Gen: Alert, well appearing.  Patient is oriented to person, place, time, and situation. ENT: Ears: EACs clear, normal epithelium.  TMs with good light reflex and landmarks bilaterally.  Eyes: no injection, icteris, swelling, or exudate.  EOMI, PERRLA. Nose: no drainage or turbinate edema/swelling.  No injection or focal lesion.  Mouth: lips without lesion/swelling.  Oral mucosa pink and moist.  Dentition intact and without obvious caries or gingival swelling.  Oropharynx without erythema, exudate, or swelling.  Neck - No masses or thyromegaly or limitation in range of motion CV: RRR, no m/r/g. LUNGS: CTA bilat, bronchial BS in upper regions of both lungs, aeration is decent, exp phase minimally prolonged.  Nonlabored resps. EXT: no clubbing, cyanosis, or edema.    IMPRESSION AND PLAN:  1) Depression with anxiety: continue effexor and diazepam at current dosing.  2) COPD: stable.  Continue oxygen during sleep and continue current med regimen and keep routine pulmonology f/u.  3) HTN: stable.  Continue current meds and diet.    4) Hx of hyperkalemia: check BMET today.  5) hypothyroidism: due for TSH check today.  FOLLOW UP: 3 mo

## 2012-06-14 ENCOUNTER — Encounter: Payer: Self-pay | Admitting: *Deleted

## 2012-06-14 ENCOUNTER — Ambulatory Visit (INDEPENDENT_AMBULATORY_CARE_PROVIDER_SITE_OTHER): Payer: Medicare Other | Admitting: Pulmonary Disease

## 2012-06-14 ENCOUNTER — Encounter: Payer: Self-pay | Admitting: Pulmonary Disease

## 2012-06-14 VITALS — BP 122/70 | HR 84 | Ht 61.5 in | Wt 140.8 lb

## 2012-06-14 DIAGNOSIS — R0902 Hypoxemia: Secondary | ICD-10-CM | POA: Diagnosis not present

## 2012-06-14 DIAGNOSIS — J449 Chronic obstructive pulmonary disease, unspecified: Secondary | ICD-10-CM | POA: Diagnosis not present

## 2012-06-14 NOTE — Assessment & Plan Note (Signed)
She is to continue spiriva, advair, and daliresp.

## 2012-06-14 NOTE — Assessment & Plan Note (Signed)
She is to continue oxygen at night and as needed during the day.

## 2012-06-14 NOTE — Patient Instructions (Signed)
Follow up in 6 months 

## 2012-06-14 NOTE — Progress Notes (Signed)
Chief Complaint  Patient presents with  . Follow-up    Priscilla Cantrell states she has her good and bad days with her breathing. Priscilla Cantrell has occasional cough. denies any wheezing, no chest tx, no nasal congestion, no facial pressure.     History of Present Illness: Priscilla Cantrell is a 77 y.o. female former smoker with GOLD 4 COPD, ACE related cough, and hypoxemia on 2 liters oxygen with exertion and sleep.  She was treated for an exacerbation in March with zithromax and prednisone.  She remains on spiriva, advair, and daliresp.  She has not needed to use proair much recently.  She continues to use oxygen at night.  She feels daliresp has helped considerably.  She has occasional cough and scratch throat >> especially when she goes outside.  She denies wheeze, sputum or hemoptysis.  She keeps up with her activities at an even pace.  Tests: Spirometry 01/10/09>>FEV1 0.97(52%), FEV1% 47 March 2012>>Daliresp started  She  has a past medical history of Hypertension; COPD (chronic obstructive pulmonary disease); Hypoxemia; Pulmonary nodule; Spinal stenosis; GERD (gastroesophageal reflux disease); Hypothyroidism; Depression; Insomnia; Plantar fasciitis; Diverticulosis of colon; DDD (degenerative disc disease), cervical; DDD (degenerative disc disease), lumbar; Spinal stenosis of lumbar region with neurogenic claudication; Fibrocystic breast disease; Chest pain, non-cardiac; History of double vision; and Depression with anxiety (04/15/2011).  She  has past surgical history that includes Cholecystectomy (2001); Tubal ligation (1974); Abdominal hysterectomy (1978); Breast cyst excision; Appendectomy; and Tonsillectomy.  Current Outpatient Prescriptions on File Prior to Visit  Medication Sig Dispense Refill  . ADVAIR DISKUS 250-50 MCG/DOSE AEPB INHALE 1 PUFF BY MOUTH TWICE A DAY  60 each  6  . albuterol (PROAIR HFA) 108 (90 BASE) MCG/ACT inhaler Inhale 2 puffs into the lungs every 4 (four) hours as needed.  1 Inhaler  5   . amLODipine (NORVASC) 5 MG tablet Take 5 mg by mouth daily.      Marland Kitchen conjugated estrogens (PREMARIN) vaginal cream Place vaginally daily.  42.5 g  12  . DALIRESP 500 MCG TABS tablet TAKE 1 TABLET (500 MCG TOTAL) BY MOUTH DAILY.  30 tablet  5  . diazepam (VALIUM) 5 MG tablet Take 1 tablet (5 mg total) by mouth every 12 (twelve) hours as needed for anxiety or sleep.  90 tablet  3  . HYDROcodone-homatropine (HYCODAN) 5-1.5 MG/5ML syrup Take 5 mLs by mouth every 6 (six) hours as needed.  120 mL  0  . ipratropium (ATROVENT) 0.03 % nasal spray 2 sprays into each nostril q12h prn  30 mL  1  . loratadine (CLARITIN) 10 MG tablet Take 10 mg by mouth as needed.        Marland Kitchen omeprazole (PRILOSEC) 20 MG capsule Take 20 mg by mouth daily.        Marland Kitchen oxyCODONE-acetaminophen (PERCOCET) 5-325 MG per tablet Take 0.25 tablets by mouth every 6 (six) hours as needed.       Marland Kitchen SPIRIVA HANDIHALER 18 MCG inhalation capsule PLACE 1 CAPSULE (18 MCG TOTAL) INTO INHALER AND INHALE DAILY.  30 capsule  4  . SYNTHROID 88 MCG tablet TAKE 1 TABLET BY MOUTH EVERY DAY  30 tablet  10  . venlafaxine (EFFEXOR) 50 MG tablet Take 1 tablet (50 mg total) by mouth 2 (two) times daily.  60 tablet  6   No current facility-administered medications on file prior to visit.    Allergies  Allergen Reactions  . Ace Inhibitors     REACTION: cough  . Cefixime (FAO:ZHYQMVHQ)  Other (See Comments)    unspecified  . Losartan Other (See Comments)    hyperkalemia  . Penicillins     REACTION: rash, hives, swelling, welts  . Statins   . Sulfa Antibiotics Other (See Comments)    unspecified    Physical Exam:  General - Thin HEENT - No sinus tenderness, no oral exudate, no LAN Cardiac - s1s2 no murmur Chest - prolonged exhalation, no wheeze/rales Abdomen - soft, nontender Extremities - no edema Skin - no rashes Neurologic - normal strength Psychiatric - normal mood, behavior   Assessment/Plan:  Coralyn Helling, MD [facility] Pager:  [phone]

## 2012-06-20 ENCOUNTER — Encounter: Payer: Self-pay | Admitting: Family Medicine

## 2012-08-11 ENCOUNTER — Other Ambulatory Visit: Payer: Self-pay | Admitting: Family Medicine

## 2012-08-11 MED ORDER — AMLODIPINE BESYLATE 5 MG PO TABS: 5.0000 mg | ORAL_TABLET | Freq: Every day | ORAL | Status: DC

## 2012-09-03 ENCOUNTER — Ambulatory Visit: Payer: Medicare Other | Admitting: Family Medicine

## 2012-09-29 ENCOUNTER — Ambulatory Visit: Payer: Medicare Other | Admitting: Family Medicine

## 2012-10-04 ENCOUNTER — Other Ambulatory Visit: Payer: Self-pay | Admitting: Pulmonary Disease

## 2012-10-07 ENCOUNTER — Ambulatory Visit (INDEPENDENT_AMBULATORY_CARE_PROVIDER_SITE_OTHER): Payer: Medicare Other | Admitting: Family Medicine

## 2012-10-07 ENCOUNTER — Encounter: Payer: Self-pay | Admitting: Family Medicine

## 2012-10-07 VITALS — BP 120/60 | HR 64 | Temp 97.8°F | Ht 61.0 in | Wt 137.8 lb

## 2012-10-07 DIAGNOSIS — F418 Other specified anxiety disorders: Secondary | ICD-10-CM

## 2012-10-07 DIAGNOSIS — Z23 Encounter for immunization: Secondary | ICD-10-CM

## 2012-10-07 DIAGNOSIS — F411 Generalized anxiety disorder: Secondary | ICD-10-CM | POA: Diagnosis not present

## 2012-10-07 DIAGNOSIS — F341 Dysthymic disorder: Secondary | ICD-10-CM

## 2012-10-07 DIAGNOSIS — E039 Hypothyroidism, unspecified: Secondary | ICD-10-CM

## 2012-10-07 DIAGNOSIS — J449 Chronic obstructive pulmonary disease, unspecified: Secondary | ICD-10-CM

## 2012-10-07 DIAGNOSIS — I10 Essential (primary) hypertension: Secondary | ICD-10-CM | POA: Diagnosis not present

## 2012-10-07 NOTE — Progress Notes (Addendum)
OFFICE NOTE  11/04/2012  CC:  Chief Complaint  Patient presents with  . Follow-up    3 month     HPI: Patient is a 77 y.o. Caucasian female who is here for 3 mo f/u COPD, HTN, hypoth, anxiety/dep. Feeling fine.  No SOB, no cough, no fevers.  Describes brief (1-2 min), random, diffuse pain in upper chest (mild intensity), not assoc with nausea, diaphoresis, palpitations, or lightheadedness.  No known triggers, no known alleviating factors.   She is dealing with even more stress than usual lately: her daughter's marriage has dissolved over the last several months and this has not been a friendly split.  She vented about this some today.  She is able to talk to her husband and some other family/friends about it.  Goes to pain clinic in December next-this is where she gets oxycodone rx'd.   Pertinent PMH:  Past Medical History  Diagnosis Date  . Hypertension     EKG 03/2010 normal  . COPD (chronic obstructive pulmonary disease)     spirometry 01/10/09 FEV 0.97(52%), FEV1% 47  . Hypoxemia     O2 86% RA with exertion 01/10/09, 2 liters oxygen with exertion and sleep  . Pulmonary nodule     CT chest 12/10, June 2011.  No nodule on CT 06/2010.  Marland Kitchen Spinal stenosis     Symptomatic (neurogenic claudication)  . GERD (gastroesophageal reflux disease)   . Hypothyroidism     Hashimoto's, dx'd 1989  . Depression   . Insomnia   . Plantar fasciitis   . Diverticulosis of colon     Procto '97 and colonoscopy 2002  . DDD (degenerative disc disease), cervical     1998 MRI C5-6 impingemt (left)  . DDD (degenerative disc disease), lumbar   . Spinal stenosis of lumbar region with neurogenic claudication     2008 MRI (L3-4, L4-5, L5-S1)  . Fibrocystic breast disease     w/fibroadenoma.  Bx's showed NO atypia (Dr. Jamey Ripa).  Mammo neg 2008.  Marland Kitchen Chest pain, non-cardiac     Cardiac CT showed no signif obstructive dz, mildly elevated calcium level (Dr. Jens Som).  . History of double vision    Ophthalmologist, Dr. Elmer Picker, is further evaluating this with MRI  orbits and limited brain (myesthenia gravis testing neg 07/2010)  . Depression with anxiety 04/15/2011   Past surgical, social, and family history reviewed and no changes noted since last office visit.  MEDS:  Outpatient Prescriptions Prior to Visit  Medication Sig Dispense Refill  . ADVAIR DISKUS 250-50 MCG/DOSE AEPB INHALE 1 PUFF BY MOUTH TWICE A DAY  60 each  6  . albuterol (PROAIR HFA) 108 (90 BASE) MCG/ACT inhaler Inhale 2 puffs into the lungs every 4 (four) hours as needed.  1 Inhaler  5  . amLODipine (NORVASC) 5 MG tablet Take 1 tablet (5 mg total) by mouth daily.  30 tablet  3  . DALIRESP 500 MCG TABS tablet TAKE 1 TABLET (500 MCG TOTAL) BY MOUTH DAILY.  30 tablet  5  . diazepam (VALIUM) 5 MG tablet Take 1 tablet (5 mg total) by mouth every 12 (twelve) hours as needed for anxiety or sleep.  90 tablet  3  . HYDROcodone-homatropine (HYCODAN) 5-1.5 MG/5ML syrup Take 5 mLs by mouth every 6 (six) hours as needed.  120 mL  0  . ipratropium (ATROVENT) 0.03 % nasal spray 2 sprays into each nostril q12h prn  30 mL  1  . loratadine (CLARITIN) 10 MG tablet Take  10 mg by mouth as needed.        Marland Kitchen omeprazole (PRILOSEC) 20 MG capsule Take 20 mg by mouth daily.        Marland Kitchen oxyCODONE-acetaminophen (PERCOCET) 5-325 MG per tablet Take 0.25 tablets by mouth every 6 (six) hours as needed.       Marland Kitchen SYNTHROID 88 MCG tablet TAKE 1 TABLET BY MOUTH EVERY DAY  30 tablet  10  . venlafaxine (EFFEXOR) 50 MG tablet Take 1 tablet (50 mg total) by mouth 2 (two) times daily.  60 tablet  6  . SPIRIVA HANDIHALER 18 MCG inhalation capsule PLACE 1 CAPSULE (18 MCG TOTAL) INTO INHALER AND INHALE DAILY.  30 capsule  4  . conjugated estrogens (PREMARIN) vaginal cream Place vaginally daily.  42.5 g  12   No facility-administered medications prior to visit.    PE: Blood pressure 120/60, pulse 64, temperature 97.8 F (36.6 C), temperature source Oral, height 5\' 1"   (1.549 m), weight 137 lb 12 oz (62.483 kg), SpO2 88.00%. Gen: Alert, well appearing.  Patient is oriented to person, place, time, and situation. CV: RRR, no m/r/g LUNGS: clear, nonlabored resps, equal aeration in all lung fields, mildly diminished BS diffusely. Trace bilat LE pitting edema  LAB: none today    Chemistry      Component Value Date/Time   NA 139 06/11/2012 1549   K 4.3 06/11/2012 1549   CL 104 06/11/2012 1549   CO2 21 06/11/2012 1549   BUN 13 06/11/2012 1549   CREATININE 0.81 06/11/2012 1549   CREATININE 0.8 11/06/2011 1555      Component Value Date/Time   CALCIUM 9.6 06/11/2012 1549   ALKPHOS 86 03/07/2010 1526   AST 20 03/07/2010 1526   ALT 18 03/07/2010 1526   BILITOT 0.4 03/07/2010 1526     Lab Results  Component Value Date   TSH 1.016 06/11/2012     IMPRESSION AND PLAN  ESSENTIAL HYPERTENSION Problem stable.  Continue current medications and diet appropriate for this condition.  We have reviewed our general long term plan for this problem and also reviewed symptoms and signs that should prompt the patient to call or return to the office.   COPD Problem stable.  Continue current medications and diet appropriate for this condition.  We have reviewed our general long term plan for this problem and also reviewed symptoms and signs that should prompt the patient to call or return to the office.   Depression with anxiety Dealing pretty well with a lot of issues at the same time---as per her usual.    HYPOTHYROIDISM Stable TSH about 65mo ago. Continue current levothyroxine dosing. Plan on rechecking this approx 06/2013.   An After Visit Summary was printed and given to the patient.  FOLLOW UP: 4 mo f/u chronic probs

## 2012-11-01 ENCOUNTER — Other Ambulatory Visit: Payer: Self-pay | Admitting: Pulmonary Disease

## 2012-11-03 ENCOUNTER — Other Ambulatory Visit: Payer: Self-pay | Admitting: Pulmonary Disease

## 2012-11-03 DIAGNOSIS — M1710 Unilateral primary osteoarthritis, unspecified knee: Secondary | ICD-10-CM | POA: Diagnosis not present

## 2012-11-03 DIAGNOSIS — IMO0002 Reserved for concepts with insufficient information to code with codable children: Secondary | ICD-10-CM | POA: Diagnosis not present

## 2012-11-03 DIAGNOSIS — M47817 Spondylosis without myelopathy or radiculopathy, lumbosacral region: Secondary | ICD-10-CM | POA: Diagnosis not present

## 2012-11-04 NOTE — Assessment & Plan Note (Signed)
Problem stable.  Continue current medications and diet appropriate for this condition.  We have reviewed our general long term plan for this problem and also reviewed symptoms and signs that should prompt the patient to call or return to the office.  

## 2012-11-04 NOTE — Assessment & Plan Note (Signed)
Stable TSH about 88mo ago. Continue current levothyroxine dosing. Plan on rechecking this approx 06/2013.

## 2012-11-04 NOTE — Addendum Note (Signed)
Addended by: Jeoffrey Massed on: 11/04/2012 02:47 PM   Modules accepted: Level of Service

## 2012-11-04 NOTE — Assessment & Plan Note (Signed)
Dealing pretty well with a lot of issues at the same time---as per her usual.

## 2012-12-08 ENCOUNTER — Other Ambulatory Visit: Payer: Self-pay | Admitting: Family Medicine

## 2012-12-08 MED ORDER — AMLODIPINE BESYLATE 5 MG PO TABS: 5.0000 mg | ORAL_TABLET | Freq: Every day | ORAL | Status: DC

## 2012-12-10 ENCOUNTER — Other Ambulatory Visit: Payer: Self-pay | Admitting: Family Medicine

## 2012-12-10 DIAGNOSIS — E039 Hypothyroidism, unspecified: Secondary | ICD-10-CM

## 2012-12-10 MED ORDER — SYNTHROID 88 MCG PO TABS: ORAL_TABLET | ORAL | Status: DC

## 2012-12-13 ENCOUNTER — Telehealth: Payer: Self-pay | Admitting: Family Medicine

## 2012-12-13 DIAGNOSIS — F329 Major depressive disorder, single episode, unspecified: Secondary | ICD-10-CM

## 2012-12-13 DIAGNOSIS — F419 Anxiety disorder, unspecified: Secondary | ICD-10-CM

## 2012-12-13 DIAGNOSIS — F32A Depression, unspecified: Secondary | ICD-10-CM

## 2012-12-15 MED ORDER — DIAZEPAM 5 MG PO TABS: 5.0000 mg | ORAL_TABLET | Freq: Two times a day (BID) | ORAL | Status: DC | PRN

## 2012-12-15 NOTE — Telephone Encounter (Signed)
Patient requesting rx for valium.  Patient last seen 10/07/12.  Last rx was 04/28/12 x 3 refills.   Please advise refill.

## 2012-12-15 NOTE — Telephone Encounter (Signed)
rx faxed to pharmacy

## 2012-12-15 NOTE — Telephone Encounter (Signed)
Rx printed

## 2012-12-16 ENCOUNTER — Ambulatory Visit (INDEPENDENT_AMBULATORY_CARE_PROVIDER_SITE_OTHER): Payer: Medicare Other | Admitting: Pulmonary Disease

## 2012-12-16 ENCOUNTER — Encounter: Payer: Self-pay | Admitting: Pulmonary Disease

## 2012-12-16 VITALS — BP 110/72 | HR 84 | Ht 61.5 in | Wt 144.0 lb

## 2012-12-16 DIAGNOSIS — J449 Chronic obstructive pulmonary disease, unspecified: Secondary | ICD-10-CM | POA: Diagnosis not present

## 2012-12-16 DIAGNOSIS — R0902 Hypoxemia: Secondary | ICD-10-CM

## 2012-12-16 DIAGNOSIS — J4489 Other specified chronic obstructive pulmonary disease: Secondary | ICD-10-CM

## 2012-12-16 NOTE — Patient Instructions (Signed)
Follow up in 6 months 

## 2012-12-16 NOTE — Progress Notes (Signed)
Chief Complaint  Patient presents with  . COPD    Breathing is unchanged. Reports slight SOB when lifting heavy objects. Denies chest tightness, coughing or wheezing.    History of Present Illness: Priscilla Cantrell is a 77 y.o. female former smoker with GOLD 4 COPD, ACE related cough, and hypoxemia on 2 liters oxygen with exertion and sleep.  She has been having a rough time these past few weeks after her husband passed away >> he had ruptured aneurysm that was non-operable.  She has lots of family support, and knew this day was going to come.  Her breathing has been stable otherwise.  She is not having much cough or sputum.  She is not wheezing  She denies sinus congestion, chest pain, or leg swelling.  She feels her exercise tolerance is better than it used to be.  Tests: Spirometry 01/10/09>>FEV1 0.97(52%), FEV1% 47 March 2012>>Daliresp started  She  has a past medical history of Hypertension; COPD (chronic obstructive pulmonary disease); Hypoxemia; Pulmonary nodule; Spinal stenosis; GERD (gastroesophageal reflux disease); Hypothyroidism; Depression; Insomnia; Plantar fasciitis; Diverticulosis of colon; DDD (degenerative disc disease), cervical; DDD (degenerative disc disease), lumbar; Spinal stenosis of lumbar region with neurogenic claudication; Fibrocystic breast disease; Chest pain, non-cardiac; History of double vision; and Depression with anxiety (04/15/2011).  She  has past surgical history that includes Cholecystectomy (2001); Tubal ligation (1974); Abdominal hysterectomy (1978); Breast cyst excision; Appendectomy; and Tonsillectomy.  Current Outpatient Prescriptions on File Prior to Visit  Medication Sig Dispense Refill  . ADVAIR DISKUS 250-50 MCG/DOSE AEPB INHALE 1 PUFF BY MOUTH TWICE A DAY  60 each  6  . albuterol (PROAIR HFA) 108 (90 BASE) MCG/ACT inhaler Inhale 2 puffs into the lungs every 4 (four) hours as needed.  1 Inhaler  5  . amLODipine (NORVASC) 5 MG tablet Take 1  tablet (5 mg total) by mouth daily.  30 tablet  3  . conjugated estrogens (PREMARIN) vaginal cream Place vaginally daily.  42.5 g  12  . DALIRESP 500 MCG TABS tablet TAKE 1 TABLET (500 MCG TOTAL) BY MOUTH DAILY.  30 tablet  5  . diazepam (VALIUM) 5 MG tablet Take 1 tablet (5 mg total) by mouth every 12 (twelve) hours as needed.  90 tablet  3  . HYDROcodone-homatropine (HYCODAN) 5-1.5 MG/5ML syrup Take 5 mLs by mouth every 6 (six) hours as needed.  120 mL  0  . ipratropium (ATROVENT) 0.03 % nasal spray 2 sprays into each nostril q12h prn  30 mL  1  . loratadine (CLARITIN) 10 MG tablet Take 10 mg by mouth as needed.        Marland Kitchen omeprazole (PRILOSEC) 20 MG capsule Take 20 mg by mouth daily.        Marland Kitchen oxyCODONE-acetaminophen (PERCOCET) 5-325 MG per tablet Take 0.25 tablets by mouth every 6 (six) hours as needed.       Marland Kitchen SPIRIVA HANDIHALER 18 MCG inhalation capsule INHALE 1 CAPSULE VIA HANDIHALER ONCE DAILY AT THE SAME TIME EVERY DAY  3 capsule  2  . SYNTHROID 88 MCG tablet TAKE 1 TABLET BY MOUTH EVERY DAY  30 tablet  6  . venlafaxine (EFFEXOR) 50 MG tablet Take 1 tablet (50 mg total) by mouth 2 (two) times daily.  60 tablet  6   No current facility-administered medications on file prior to visit.    Allergies  Allergen Reactions  . Ace Inhibitors     REACTION: cough  . Cefixime [Kdc:Cefixime] Other (See Comments)  unspecified  . Losartan Other (See Comments)    hyperkalemia  . Penicillins     REACTION: rash, hives, swelling, welts  . Statins   . Sulfa Antibiotics Other (See Comments)    unspecified    Physical Exam:  General - Thin HEENT - No sinus tenderness, no oral exudate, no LAN Cardiac - s1s2 no murmur Chest - prolonged exhalation, no wheeze/rales Abdomen - soft, nontender Extremities - no edema Skin - no rashes Neurologic - normal strength Psychiatric - normal mood, behavior   Assessment/Plan:  Coralyn Helling, MD [facility] Pager:   [phone]

## 2012-12-17 NOTE — Assessment & Plan Note (Signed)
Stable on current regimen of spiriva, advair, daliresp, and prn proair.   

## 2012-12-17 NOTE — Assessment & Plan Note (Signed)
Continue supplemental oxygen at night and as needed during the day.   

## 2013-01-17 ENCOUNTER — Ambulatory Visit: Payer: Medicare Other | Admitting: Family Medicine

## 2013-01-18 ENCOUNTER — Encounter: Payer: Self-pay | Admitting: Family Medicine

## 2013-01-18 ENCOUNTER — Ambulatory Visit (INDEPENDENT_AMBULATORY_CARE_PROVIDER_SITE_OTHER): Payer: Medicare Other | Admitting: Family Medicine

## 2013-01-18 VITALS — BP 116/79 | HR 90 | Temp 98.3°F | Resp 18 | Ht 61.0 in | Wt 138.0 lb

## 2013-01-18 DIAGNOSIS — IMO0002 Reserved for concepts with insufficient information to code with codable children: Secondary | ICD-10-CM | POA: Diagnosis not present

## 2013-01-18 DIAGNOSIS — L089 Local infection of the skin and subcutaneous tissue, unspecified: Secondary | ICD-10-CM

## 2013-01-18 MED ORDER — CLINDAMYCIN HCL 300 MG PO CAPS: 300.0000 mg | ORAL_CAPSULE | Freq: Three times a day (TID) | ORAL | Status: DC

## 2013-01-18 MED ORDER — MUPIROCIN 2 % EX OINT: 1.0000 "application " | TOPICAL_OINTMENT | Freq: Three times a day (TID) | CUTANEOUS | Status: DC

## 2013-01-18 NOTE — Progress Notes (Addendum)
Pre visit review using our clinic review tool, if applicable. No additional management support is needed unless otherwise documented below in the visit note. OFFICE NOTE  01/18/2013  CC:  Chief Complaint  Patient presents with  . Laceration    on hand x Thanksgiving     HPI: Patient is a 77 y.o. Caucasian female who is here for suspicion of infection of hand. Cut right pinky on cement--abrasive injury--about 3 wks ago.  Has used vaseline, neosporin, and peroxide.  It hurts when it is hit against something, otherwise no pain. No fever.   Pertinent PMH:  Past Medical History  Diagnosis Date  . Hypertension     EKG 03/2010 normal  . COPD (chronic obstructive pulmonary disease)     spirometry 01/10/09 FEV 0.97(52%), FEV1% 47  . Hypoxemia     O2 86% RA with exertion 01/10/09, 2 liters oxygen with exertion and sleep  . Pulmonary nodule     CT chest 12/10, June 2011.  No nodule on CT 06/2010.  Marland Kitchen Spinal stenosis     Symptomatic (neurogenic claudication)  . GERD (gastroesophageal reflux disease)   . Hypothyroidism     Hashimoto's, dx'd 1989  . Depression   . Insomnia   . Plantar fasciitis   . Diverticulosis of colon     Procto '97 and colonoscopy 2002  . DDD (degenerative disc disease), cervical     1998 MRI C5-6 impingemt (left)  . DDD (degenerative disc disease), lumbar   . Spinal stenosis of lumbar region with neurogenic claudication     2008 MRI (L3-4, L4-5, L5-S1)  . Fibrocystic breast disease     w/fibroadenoma.  Bx's showed NO atypia (Dr. Jamey Ripa).  Mammo neg 2008.  Marland Kitchen Chest pain, non-cardiac     Cardiac CT showed no signif obstructive dz, mildly elevated calcium level (Dr. Jens Som).  . History of double vision     Ophthalmologist, Dr. Elmer Picker, is further evaluating this with MRI  orbits and limited brain (myesthenia gravis testing neg 07/2010)  . Depression with anxiety 04/15/2011   Past surgical, social, and family history reviewed and no changes noted since last office  visit.  MEDS:  Outpatient Prescriptions Prior to Visit  Medication Sig Dispense Refill  . ADVAIR DISKUS 250-50 MCG/DOSE AEPB INHALE 1 PUFF BY MOUTH TWICE A DAY  60 each  6  . albuterol (PROAIR HFA) 108 (90 BASE) MCG/ACT inhaler Inhale 2 puffs into the lungs every 4 (four) hours as needed.  1 Inhaler  5  . amLODipine (NORVASC) 5 MG tablet Take 1 tablet (5 mg total) by mouth daily.  30 tablet  3  . conjugated estrogens (PREMARIN) vaginal cream Place vaginally daily.  42.5 g  12  . DALIRESP 500 MCG TABS tablet TAKE 1 TABLET (500 MCG TOTAL) BY MOUTH DAILY.  30 tablet  5  . diazepam (VALIUM) 5 MG tablet Take 1 tablet (5 mg total) by mouth every 12 (twelve) hours as needed.  90 tablet  3  . ipratropium (ATROVENT) 0.03 % nasal spray 2 sprays into each nostril q12h prn  30 mL  1  . loratadine (CLARITIN) 10 MG tablet Take 10 mg by mouth as needed.        Marland Kitchen omeprazole (PRILOSEC) 20 MG capsule Take 20 mg by mouth daily.        Marland Kitchen oxyCODONE-acetaminophen (PERCOCET) 5-325 MG per tablet Take 0.25 tablets by mouth every 6 (six) hours as needed.       Marland Kitchen SPIRIVA HANDIHALER 18  MCG inhalation capsule INHALE 1 CAPSULE VIA HANDIHALER ONCE DAILY AT THE SAME TIME EVERY DAY  3 capsule  2  . SYNTHROID 88 MCG tablet TAKE 1 TABLET BY MOUTH EVERY DAY  30 tablet  6  . venlafaxine (EFFEXOR) 50 MG tablet Take 1 tablet (50 mg total) by mouth 2 (two) times daily.  60 tablet  6  . HYDROcodone-homatropine (HYCODAN) 5-1.5 MG/5ML syrup Take 5 mLs by mouth every 6 (six) hours as needed.  120 mL  0   No facility-administered medications prior to visit.    PE: Blood pressure 116/79, pulse 90, temperature 98.3 F (36.8 C), temperature source Temporal, resp. rate 18, height 5\' 1"  (1.549 m), weight 138 lb (62.596 kg), SpO2 94.00%. Gen: Alert, well appearing.  Patient is oriented to person, place, time, and situation. Right pinky finger: lateral aspect, proximal portion with 1 cm scab with mild blanching erythema surrounding it.  No  streaking.  Mildly tender to palpation.  ROM of finger intact.  IMPRESSION AND PLAN:  Finger abrasion, slow to heal.  Possibly a bit of bacterial infection. Will treat with clindamycin given her multiple antibiotic allergies.  Also gave rx for bactroban ointment to apply tid x 10d.  FOLLOW UP: has appt scheduled for next week (routine)

## 2013-01-20 ENCOUNTER — Ambulatory Visit (INDEPENDENT_AMBULATORY_CARE_PROVIDER_SITE_OTHER): Payer: Medicare Other | Admitting: Nurse Practitioner

## 2013-01-20 ENCOUNTER — Encounter: Payer: Self-pay | Admitting: Nurse Practitioner

## 2013-01-20 VITALS — BP 100/60 | HR 72 | Temp 98.0°F | Ht 61.0 in | Wt 140.5 lb

## 2013-01-20 DIAGNOSIS — N952 Postmenopausal atrophic vaginitis: Secondary | ICD-10-CM | POA: Diagnosis not present

## 2013-01-20 DIAGNOSIS — N8110 Cystocele, unspecified: Secondary | ICD-10-CM

## 2013-01-20 DIAGNOSIS — N8111 Cystocele, midline: Secondary | ICD-10-CM | POA: Diagnosis not present

## 2013-01-20 MED ORDER — ESTROGENS, CONJUGATED 0.625 MG/GM VA CREA: TOPICAL_CREAM | Freq: Every day | VAGINAL | Status: DC

## 2013-01-20 NOTE — Patient Instructions (Signed)
I think the discharge gets trapped behind the bladder, accumulates, then dispels at once. There is no odor, I am not concerned about infection. If your symptoms change, I recommend that you follow up with Dr Henderson Cloud. Pleasure to see you!

## 2013-01-20 NOTE — Progress Notes (Signed)
Pre-visit discussion using our clinic review tool. No additional management support is needed unless otherwise documented below in the visit note.  

## 2013-01-21 LAB — POCT URINALYSIS DIPSTICK
Bilirubin, UA: NEGATIVE
Blood, UA: NEGATIVE
Glucose, UA: NEGATIVE
Ketones, UA: NEGATIVE
Nitrite, UA: NEGATIVE
Protein, UA: NEGATIVE
Spec Grav, UA: 1.01
Urobilinogen, UA: 0.2
pH, UA: 5.5

## 2013-01-21 NOTE — Progress Notes (Addendum)
Subjective:     Priscilla Cantrell is a 77 y.o. female who presents for evaluation of an abnormal vaginal discharge. She states she had 2-3 episodes of moderate discharge in underwear, then 1 episode if a larger amount.  She denies malodorous discharge, pruritus, or rash. No dysuria, back pain, or abdominal pain. Her husband passed 2 months ago. She is not sexually active. She has a history of bladder prolapse and atrophic vaginitis for which she uses premarin cream PRN.  The following portions of the patient's history were reviewed and updated as appropriate: allergies, current medications, past family history, past medical history, past social history, past surgical history and problem list.   Review of Systems Constitutional: negative for chills, fatigue, fevers, night sweats and weight loss Gastrointestinal: negative for abdominal pain, change in bowel habits, dyspepsia, nausea and vomiting Genitourinary:negative for genital lesions, dysuria, frequency, hematuria and hesitancy Integument/breast: negative for rash and positive for occasional pruritus, not r/t episodes of discharge Behavioral/Psych: recently lost husband, cpoing well, staying busy, having expected moments of sadness.    Objective:    BP 100/60  Pulse 72  Temp(Src) 98 F (36.7 C) (Oral)  Ht 5\' 1"  (1.549 m)  Wt 140 lb 8 oz (63.73 kg)  BMI 26.56 kg/m2  SpO2 94% General appearance: alert, cooperative, appears stated age and no distress Head: Normocephalic, without obvious abnormality, atraumatic Eyes: negative findings: lids and lashes normal and conjunctivae and sclerae normal Lungs: clear to auscultation bilaterally Heart: regular rate and rhythm, S1, S2 normal, no murmur, click, rub or gallop Abdomen: soft, non-tender; bowel sounds normal; no masses,  no organomegaly Pelvic: adnexae not palpable, no adnexal masses or tenderness, perianal skin: no external genital warts noted and positive findings: bladder tenderness,  cystocele, vaginal discharge:  normal and physiologic, scant, clear and thin or unable to visualize cervix due to prolapsed bladder-pt had discomfort when lifted cystocele to pass speculum behind bladder.    Assessment:    normal vaginal discharge, likely getting trapped behind cystocele, accumulating, then discharges sporadically. Recommend ref to gyn as unable to visualize cervix due to bladder prolapse.Marland Kitchen   POC ua- +leuks, neg nites, blood, protein Plan:    pt declined gyn referral. Will consider if discharge changes, becomes malodorous, or wishes to discuss cystocele repair. Currently not causing bladder incontinence or discomfort.

## 2013-01-24 ENCOUNTER — Telehealth: Payer: Self-pay | Admitting: Family Medicine

## 2013-01-24 MED ORDER — AZITHROMYCIN 250 MG PO TABS: ORAL_TABLET | ORAL | Status: DC

## 2013-01-24 NOTE — Telephone Encounter (Signed)
Pls have her stop her clindamycin. Z-pack eRx'd.

## 2013-01-24 NOTE — Telephone Encounter (Signed)
Asked patient if she is still taking clindamycin. Patient stated that she is not taking the clindamycin. Informed patient that [facility].

## 2013-01-24 NOTE — Telephone Encounter (Signed)
Patient is requesting a zpak to be sent to [facility]. She feels like she is getting bronchitis. She is coughing up a mucus.

## 2013-01-31 ENCOUNTER — Ambulatory Visit (INDEPENDENT_AMBULATORY_CARE_PROVIDER_SITE_OTHER): Payer: Medicare Other | Admitting: Family Medicine

## 2013-01-31 ENCOUNTER — Encounter: Payer: Self-pay | Admitting: Family Medicine

## 2013-01-31 VITALS — BP 113/73 | HR 73 | Temp 97.6°F | Resp 16 | Ht 61.0 in | Wt 138.0 lb

## 2013-01-31 DIAGNOSIS — J449 Chronic obstructive pulmonary disease, unspecified: Secondary | ICD-10-CM | POA: Diagnosis not present

## 2013-01-31 DIAGNOSIS — Z5189 Encounter for other specified aftercare: Secondary | ICD-10-CM | POA: Diagnosis not present

## 2013-01-31 DIAGNOSIS — R252 Cramp and spasm: Secondary | ICD-10-CM

## 2013-01-31 DIAGNOSIS — L089 Local infection of the skin and subcutaneous tissue, unspecified: Secondary | ICD-10-CM

## 2013-01-31 LAB — BASIC METABOLIC PANEL
BUN: 12 mg/dL (ref 6–23)
CO2: 29 mEq/L (ref 19–32)
Calcium: 9.9 mg/dL (ref 8.4–10.5)
Chloride: 103 mEq/L (ref 96–112)
Creatinine, Ser: 0.8 mg/dL (ref 0.4–1.2)
GFR: 71.09 mL/min (ref >60.00)
Glucose, Bld: 100 mg/dL — ABNORMAL HIGH (ref 70–99)
Potassium: 4.4 mEq/L (ref 3.5–5.1)
Sodium: 141 mEq/L (ref 135–145)

## 2013-01-31 LAB — PHOSPHORUS: Phosphorus: 3.8 mg/dL (ref 2.3–4.6)

## 2013-01-31 LAB — MAGNESIUM: Magnesium: 2.1 mg/dL (ref 1.5–2.5)

## 2013-01-31 NOTE — Progress Notes (Signed)
OFFICE NOTE  01/31/2013  CC:  Chief Complaint  Patient presents with  . Follow-up  . Nail Problem    toe nails and finger nails     HPI: Patient is a 77 y.o. Caucasian female who is here for f/u recent bronchitis illness, feeling lots better but still with some coughing.  Had some fever but this has resolved.  Finished a Z-pack that I had called in to her pharmacy. Has had some mild superficial peeling and discoloration of some nails on and off.  No pain.  Has intermittent right medial thigh muscle cramping, lasts 5 min or so, no swelling or palpable abnormality per pt.  Comes on spontaneously.    Right 5th finger infected abrasion has healed up.  Pertinent PMH:  Past Medical History  Diagnosis Date  . Hypertension     EKG 03/2010 normal  . COPD (chronic obstructive pulmonary disease)     spirometry 01/10/09 FEV 0.97(52%), FEV1% 47  . Hypoxemia     O2 86% RA with exertion 01/10/09, 2 liters oxygen with exertion and sleep  . Pulmonary nodule     CT chest 12/10, June 2011.  No nodule on CT 06/2010.  Marland Kitchen Spinal stenosis     Symptomatic (neurogenic claudication)  . GERD (gastroesophageal reflux disease)   . Hypothyroidism     Hashimoto's, dx'd 1989  . Depression   . Insomnia   . Plantar fasciitis   . Diverticulosis of colon     Procto '97 and colonoscopy 2002  . DDD (degenerative disc disease), cervical     1998 MRI C5-6 impingemt (left)  . DDD (degenerative disc disease), lumbar   . Spinal stenosis of lumbar region with neurogenic claudication     2008 MRI (L3-4, L4-5, L5-S1)  . Fibrocystic breast disease     w/fibroadenoma.  Bx's showed NO atypia (Dr. Jamey Ripa).  Mammo neg 2008.  Marland Kitchen Chest pain, non-cardiac     Cardiac CT showed no signif obstructive dz, mildly elevated calcium level (Dr. Jens Som).  . History of double vision     Ophthalmologist, Dr. Elmer Picker, is further evaluating this with MRI  orbits and limited brain (myesthenia gravis testing neg 07/2010)  . Depression  with anxiety 04/15/2011    MEDS:  Outpatient Prescriptions Prior to Visit  Medication Sig Dispense Refill  . ADVAIR DISKUS 250-50 MCG/DOSE AEPB INHALE 1 PUFF BY MOUTH TWICE A DAY  60 each  6  . albuterol (PROAIR HFA) 108 (90 BASE) MCG/ACT inhaler Inhale 2 puffs into the lungs every 4 (four) hours as needed.  1 Inhaler  5  . amLODipine (NORVASC) 5 MG tablet Take 1 tablet (5 mg total) by mouth daily.  30 tablet  3  . aspirin 81 MG tablet Take 81 mg by mouth daily.      Marland Kitchen conjugated estrogens (PREMARIN) vaginal cream Place vaginally daily.  42.5 g  12  . DALIRESP 500 MCG TABS tablet TAKE 1 TABLET (500 MCG TOTAL) BY MOUTH DAILY.  30 tablet  5  . diazepam (VALIUM) 5 MG tablet Take 1 tablet (5 mg total) by mouth every 12 (twelve) hours as needed.  90 tablet  3  . HYDROcodone-homatropine (HYCODAN) 5-1.5 MG/5ML syrup Take 5 mLs by mouth every 6 (six) hours as needed.  120 mL  0  . ipratropium (ATROVENT) 0.03 % nasal spray 2 sprays into each nostril q12h prn  30 mL  1  . loratadine (CLARITIN) 10 MG tablet Take 10 mg by mouth as needed.        Marland Kitchen  mupirocin ointment (BACTROBAN) 2 % Apply 1 application topically 3 (three) times daily.  15 g  1  . omeprazole (PRILOSEC) 20 MG capsule Take 20 mg by mouth daily.        Marland Kitchen oxyCODONE-acetaminophen (PERCOCET) 5-325 MG per tablet Take 0.25 tablets by mouth every 6 (six) hours as needed.       Marland Kitchen SPIRIVA HANDIHALER 18 MCG inhalation capsule INHALE 1 CAPSULE VIA HANDIHALER ONCE DAILY AT THE SAME TIME EVERY DAY  3 capsule  2  . SYNTHROID 88 MCG tablet TAKE 1 TABLET BY MOUTH EVERY DAY  30 tablet  6  . venlafaxine (EFFEXOR) 50 MG tablet Take 1 tablet (50 mg total) by mouth 2 (two) times daily.  60 tablet  6  . azithromycin (ZITHROMAX) 250 MG tablet 2 tabs po qd x 1d, then 1 tab po qd x 4d  6 each  0  . clindamycin (CLEOCIN) 300 MG capsule Take 1 capsule (300 mg total) by mouth 3 (three) times daily.  30 capsule  0   No facility-administered medications prior to visit.   Not taking clindamycin noted above (her finger infection has cleared up).  PE: Blood pressure 113/73, pulse 73, temperature 97.6 F (36.4 C), temperature source Temporal, resp. rate 16, height 5\' 1"  (1.549 m), weight 138 lb (62.596 kg), SpO2 94.00%. Gen: Alert, well appearing.  Patient is oriented to person, place, time, and situation. Fingernails and toenails: no peeling/breaking noted.  She has some normal-appearing distal nail color variation.  Normal nail ridging noted. No swelling or erythema of the skin adjacent to her nails. Right 5th finger with no erythema, swelling, or tenderness.  IMPRESSION AND PLAN:  Acute bronchitis/COPD flare: resolving appropriately with z-pack and prn albuterol (no systemic steroids required). I reassured pt regarding her nail concerns and her muscle cramps: nothing she needs to worry about at this time. Her right 5th finger infected abrasion is healed.  Will check BMET and Mag and Phos today.  FOLLOW UP: 3 mo

## 2013-01-31 NOTE — Progress Notes (Signed)
Pre visit review using our clinic review tool, if applicable. No additional management support is needed unless otherwise documented below in the visit note. 

## 2013-02-07 ENCOUNTER — Ambulatory Visit: Payer: Medicare Other | Admitting: Family Medicine

## 2013-02-09 ENCOUNTER — Ambulatory Visit: Payer: Medicare Other

## 2013-02-20 ENCOUNTER — Other Ambulatory Visit: Payer: Self-pay | Admitting: Pulmonary Disease

## 2013-02-24 ENCOUNTER — Telehealth: Payer: Self-pay | Admitting: Pulmonary Disease

## 2013-02-24 NOTE — Telephone Encounter (Signed)
Received a fax from [facility] in regards to pt's Daliresp. This medication is to expensive and the pt is requesting a cheaper medication.  VS - please advise. Thanks.

## 2013-02-24 NOTE — Telephone Encounter (Signed)
Spoke to the pt. She would like to continue Daliresp but can't afford it. Advised her of patient assistance. She would like to go ahead with the process of the paperwork. I am going to complete our portion of the forms and she will come by tomorrow to pick them up to complete her information. Once she has done this, she will return them and I will fax them into the company.  I am going to keep this message open to follow up on.

## 2013-02-24 NOTE — Telephone Encounter (Signed)
Please inform pt that there is no other less expensive alternative to daliresp.  She could try to do without daliresp, and continue with advair and spiriva.  If she tries this, then should call back if her breathing gets worse off of daliresp.

## 2013-02-24 NOTE — Telephone Encounter (Signed)
lmtcb x1 

## 2013-02-25 NOTE — Telephone Encounter (Signed)
[facility] CALLED BACK ABOUT THE DELISRESP STATED THEY TALKED TO THE PATIENT AND SHE STATED [facility] COVERS IT BUT THEY WERE TOLD IT DOESN'T COVER IT    [phone] [facility]

## 2013-02-25 NOTE — Telephone Encounter (Signed)
Pt is going to complete pt assistance forms.

## 2013-03-02 ENCOUNTER — Telehealth: Payer: Self-pay | Admitting: Family Medicine

## 2013-03-02 NOTE — Telephone Encounter (Signed)
Relevant patient education assigned to patient using Emmi. ° °

## 2013-04-14 ENCOUNTER — Other Ambulatory Visit: Payer: Self-pay

## 2013-04-14 ENCOUNTER — Telehealth: Payer: Self-pay

## 2013-04-14 MED ORDER — VENLAFAXINE HCL 50 MG PO TABS: 50.0000 mg | ORAL_TABLET | Freq: Two times a day (BID) | ORAL | Status: DC

## 2013-04-14 NOTE — Telephone Encounter (Signed)
PA has been initiated.  Will route to nurse to follow up on.  

## 2013-04-15 ENCOUNTER — Other Ambulatory Visit: Payer: Self-pay | Admitting: Pulmonary Disease

## 2013-04-18 ENCOUNTER — Ambulatory Visit (INDEPENDENT_AMBULATORY_CARE_PROVIDER_SITE_OTHER): Payer: Medicare Other | Admitting: Family Medicine

## 2013-04-18 ENCOUNTER — Encounter: Payer: Self-pay | Admitting: Family Medicine

## 2013-04-18 VITALS — BP 125/71 | HR 75 | Temp 97.2°F | Resp 16 | Ht 61.0 in | Wt 141.0 lb

## 2013-04-18 DIAGNOSIS — B070 Plantar wart: Secondary | ICD-10-CM | POA: Diagnosis not present

## 2013-04-18 DIAGNOSIS — J449 Chronic obstructive pulmonary disease, unspecified: Secondary | ICD-10-CM | POA: Diagnosis not present

## 2013-04-18 DIAGNOSIS — H6120 Impacted cerumen, unspecified ear: Secondary | ICD-10-CM

## 2013-04-18 DIAGNOSIS — M5430 Sciatica, unspecified side: Secondary | ICD-10-CM | POA: Diagnosis not present

## 2013-04-18 DIAGNOSIS — J4489 Other specified chronic obstructive pulmonary disease: Secondary | ICD-10-CM

## 2013-04-18 DIAGNOSIS — M5387 Other specified dorsopathies, lumbosacral region: Secondary | ICD-10-CM

## 2013-04-18 MED ORDER — PREDNISONE 20 MG PO TABS: ORAL_TABLET | ORAL | Status: DC

## 2013-04-18 NOTE — Progress Notes (Signed)
OFFICE NOTE  04/18/2013  CC:  Chief Complaint  Patient presents with  . Foot Problem    pain right foot     HPI: Patient is a 78 y.o. Caucasian female who is here for pain in right foot, also pain down the back of both glut surfaces and both upper legs, present several weeks, waxes and wanes in intensity, uncomfortable when sitting.  Ibuprofen at night helps (2 tabs).   Not currently taking oxycodone but she has some at her disposal from her pain mgmt MD, Dr. Hardin Negus.  She does say this med relieves her pain but she doesn't like taking them and she HARDLY EVER takes them.  No tingling or numbness in legs.  No fall or injury prior to onset of current pain. She has a long hx of spinal stenosis that she sees pain specialist for but says her pain from this is usually down sides of both legs.  Feels a sore spot on bottom of right foot for a couple of weeks, starting to bother her more when she wears her shoes now.  The skin is not broken in the area.  Breathing/cough is stable.  No SOB.  No wheezing.  Sputum production clear/mainly in mornings as per her usual. No recent changes despite not having been on daliresp regularly lately ($ issue).   Pertinent PMH:  Past medical, surgical, social, and family history reviewed and no changes are noted since last office visit.  MEDS:  Outpatient Prescriptions Prior to Visit  Medication Sig Dispense Refill  . ADVAIR DISKUS 250-50 MCG/DOSE AEPB INHALE 1 PUFF BY MOUTH TWICE A DAY  60 each  6  . albuterol (PROAIR HFA) 108 (90 BASE) MCG/ACT inhaler Inhale 2 puffs into the lungs every 4 (four) hours as needed.  1 Inhaler  5  . amLODipine (NORVASC) 5 MG tablet Take 1 tablet (5 mg total) by mouth daily.  30 tablet  3  . aspirin 81 MG tablet Take 81 mg by mouth daily.      Marland Kitchen conjugated estrogens (PREMARIN) vaginal cream Place vaginally daily.  42.5 g  12  . DALIRESP 500 MCG TABS tablet TAKE 1 TABLET BY MOUTH DAILY  30 tablet  5  . diazepam (VALIUM) 5 MG  tablet Take 1 tablet (5 mg total) by mouth every 12 (twelve) hours as needed.  90 tablet  3  . ipratropium (ATROVENT) 0.03 % nasal spray 2 sprays into each nostril q12h prn  30 mL  1  . loratadine (CLARITIN) 10 MG tablet Take 10 mg by mouth as needed.        Marland Kitchen omeprazole (PRILOSEC) 20 MG capsule Take 20 mg by mouth daily.        Marland Kitchen SPIRIVA HANDIHALER 18 MCG inhalation capsule INHALE 1 CAPSULE VIA HANDIHALER EVERY DAY AT THE SAME TIME  30 capsule  2  . SYNTHROID 88 MCG tablet TAKE 1 TABLET BY MOUTH EVERY DAY  30 tablet  6  . venlafaxine (EFFEXOR) 50 MG tablet Take 1 tablet (50 mg total) by mouth 2 (two) times daily.  60 tablet  1  . HYDROcodone-homatropine (HYCODAN) 5-1.5 MG/5ML syrup Take 5 mLs by mouth every 6 (six) hours as needed.  120 mL  0  . oxyCODONE-acetaminophen (PERCOCET) 5-325 MG per tablet Take 0.25 tablets by mouth every 6 (six) hours as needed.        No facility-administered medications prior to visit.    PE: Blood pressure 125/71, pulse 75, temperature 97.2 F (36.2  C), temperature source Temporal, resp. rate 16, height 5\' 1"  (1.549 m), weight 141 lb (63.957 kg), SpO2 91.00%. Gen: Alert, well appearing.  Patient is oriented to person, place, time, and situation. PFX:TKWI: no injection, icteris, swelling, or exudate.  EOMI, PERRLA. Mouth: lips without lesion/swelling.  Oral mucosa pink and moist. Oropharynx without erythema, exudate, or swelling.  CV: RRR LUNGS: trace, soft early inspiratory crackles in both bases--these cleared up as she took more and more deep breaths.  No wheezing or prolonged exp phase.  Nonlabored resps. BACK: nontender.  LE strength 5/5 prox/dist bilat.  Mild TTP over ischial tuberosity region bilat. No lateral hip tenderness.  SLR (supine) + at 10-15 deg bilat.  IMPRESSION AND PLAN:  Plantar wart of right foot Shaved callus present on top of this. Used cryo to freeze the lesion x 45 seconds. Pt tolerated procedure well.  Sciatica associated with  disorder of lumbosacral spine Will do trial of prednisone 40mg  qd x 5d.   If no improvement with this, will consider further/updated imaging of L/S spine.  COPD with chronic bronchitis The current medical regimen is effective;  continue present plan and medications. Samples of spiriva handihaler given x 1 today, samples of daliresp given x 5 boxes today.  An After Visit Summary was printed and given to the patient.   FOLLOW UP: keep appt scheduled for the end of this month (f/u back)

## 2013-04-18 NOTE — Progress Notes (Signed)
Pre visit review using our clinic review tool, if applicable. No additional management support is needed unless otherwise documented below in the visit note. 

## 2013-04-19 ENCOUNTER — Other Ambulatory Visit: Payer: Self-pay | Admitting: Family Medicine

## 2013-04-19 DIAGNOSIS — E039 Hypothyroidism, unspecified: Secondary | ICD-10-CM

## 2013-04-19 DIAGNOSIS — B070 Plantar wart: Secondary | ICD-10-CM | POA: Insufficient documentation

## 2013-04-19 DIAGNOSIS — M5387 Other specified dorsopathies, lumbosacral region: Secondary | ICD-10-CM | POA: Insufficient documentation

## 2013-04-19 MED ORDER — AMLODIPINE BESYLATE 5 MG PO TABS: 5.0000 mg | ORAL_TABLET | Freq: Every day | ORAL | Status: DC

## 2013-04-19 MED ORDER — ROFLUMILAST 500 MCG PO TABS: ORAL_TABLET | ORAL | Status: DC

## 2013-04-19 MED ORDER — FLUTICASONE-SALMETEROL 250-50 MCG/DOSE IN AEPB: INHALATION_SPRAY | RESPIRATORY_TRACT | Status: DC

## 2013-04-19 MED ORDER — TIOTROPIUM BROMIDE MONOHYDRATE 18 MCG IN CAPS: ORAL_CAPSULE | RESPIRATORY_TRACT | Status: DC

## 2013-04-19 MED ORDER — SYNTHROID 88 MCG PO TABS: ORAL_TABLET | ORAL | Status: DC

## 2013-04-19 NOTE — Assessment & Plan Note (Signed)
The current medical regimen is effective;  continue present plan and medications. Samples of spiriva handihaler given x 1 today, samples of daliresp given x 5 boxes today.

## 2013-04-19 NOTE — Assessment & Plan Note (Signed)
Will do trial of prednisone 40mg  qd x 5d.   If no improvement with this, will consider further/updated imaging of L/S spine.

## 2013-04-19 NOTE — Assessment & Plan Note (Signed)
Shaved callus present on top of this. Used cryo to freeze the lesion x 45 seconds. Pt tolerated procedure well.

## 2013-04-29 ENCOUNTER — Ambulatory Visit (INDEPENDENT_AMBULATORY_CARE_PROVIDER_SITE_OTHER): Payer: Medicare Other | Admitting: Family Medicine

## 2013-04-29 ENCOUNTER — Encounter: Payer: Self-pay | Admitting: Family Medicine

## 2013-04-29 VITALS — BP 117/74 | HR 67 | Temp 98.4°F | Resp 18 | Ht 61.0 in | Wt 142.0 lb

## 2013-04-29 DIAGNOSIS — J449 Chronic obstructive pulmonary disease, unspecified: Secondary | ICD-10-CM | POA: Diagnosis not present

## 2013-04-29 DIAGNOSIS — M4800 Spinal stenosis, site unspecified: Secondary | ICD-10-CM

## 2013-04-29 DIAGNOSIS — Z23 Encounter for immunization: Secondary | ICD-10-CM | POA: Diagnosis not present

## 2013-04-29 DIAGNOSIS — M5387 Other specified dorsopathies, lumbosacral region: Secondary | ICD-10-CM

## 2013-04-29 DIAGNOSIS — M5430 Sciatica, unspecified side: Secondary | ICD-10-CM

## 2013-04-29 MED ORDER — AMLODIPINE BESYLATE 5 MG PO TABS: 5.0000 mg | ORAL_TABLET | Freq: Every day | ORAL | Status: DC

## 2013-04-29 NOTE — Progress Notes (Signed)
Pre visit review using our clinic review tool, if applicable. No additional management support is needed unless otherwise documented below in the visit note. 

## 2013-04-29 NOTE — Progress Notes (Signed)
OFFICE NOTE  04/29/2013  CC:  Chief Complaint  Patient presents with  . Follow-up     HPI: Patient is a 78 y.o. Caucasian female who is here for 10d f/u presumed right sided sciatica treated with 5d burst of prednisone 40mg  qd.  She now feels back to normal.   Pertinent PMH:  Past medical, surgical, social, and family history reviewed and no changes are noted since last office visit.  MEDS:  Outpatient Prescriptions Prior to Visit  Medication Sig Dispense Refill  . albuterol (PROAIR HFA) 108 (90 BASE) MCG/ACT inhaler Inhale 2 puffs into the lungs every 4 (four) hours as needed.  1 Inhaler  5  . amLODipine (NORVASC) 5 MG tablet Take 1 tablet (5 mg total) by mouth daily.  90 tablet  1  . aspirin 81 MG tablet Take 81 mg by mouth daily.      Marland Kitchen conjugated estrogens (PREMARIN) vaginal cream Place vaginally daily.  42.5 g  12  . diazepam (VALIUM) 5 MG tablet Take 1 tablet (5 mg total) by mouth every 12 (twelve) hours as needed.  90 tablet  3  . Fluticasone-Salmeterol (ADVAIR DISKUS) 250-50 MCG/DOSE AEPB INHALE 1 PUFF BY MOUTH TWICE A DAY  180 each  1  . HYDROcodone-homatropine (HYCODAN) 5-1.5 MG/5ML syrup Take 5 mLs by mouth every 6 (six) hours as needed.  120 mL  0  . ipratropium (ATROVENT) 0.03 % nasal spray 2 sprays into each nostril q12h prn  30 mL  1  . loratadine (CLARITIN) 10 MG tablet Take 10 mg by mouth as needed.        Marland Kitchen omeprazole (PRILOSEC) 20 MG capsule Take 20 mg by mouth daily.        Marland Kitchen oxyCODONE-acetaminophen (PERCOCET) 5-325 MG per tablet Take 0.25 tablets by mouth every 6 (six) hours as needed.       . roflumilast (DALIRESP) 500 MCG TABS tablet TAKE 1 TABLET BY MOUTH DAILY  90 tablet  1  . SYNTHROID 88 MCG tablet TAKE 1 TABLET BY MOUTH EVERY DAY  90 tablet  1  . tiotropium (SPIRIVA HANDIHALER) 18 MCG inhalation capsule INHALE 1 CAPSULE VIA HANDIHALER EVERY DAY AT THE SAME TIME  90 capsule  1  . venlafaxine (EFFEXOR) 50 MG tablet Take 1 tablet (50 mg total) by mouth 2  (two) times daily.  60 tablet  1  . predniSONE (DELTASONE) 20 MG tablet 2 tabs po qd x 5d  10 tablet  0   No facility-administered medications prior to visit.    PE: Blood pressure 117/74, pulse 67, temperature 98.4 F (36.9 C), temperature source Temporal, resp. rate 18, height 5\' 1"  (1.549 m), weight 142 lb (64.411 kg), SpO2 94.00%. Gen: Alert, well appearing.  Patient is oriented to person, place, time, and situation. AFFECT: pleasant, lucid thought and speech.   IMPRESSION AND PLAN:  1) Sciatica: resolved.  Still with chronic spinal stenosis sx's but these are stable.  2) Grief response: doing well.  3) COPD: stable.  Prevnar 13 IM today.  An After Visit Summary was printed and given to the patient.  FOLLOW UP: 4 mo-will get CBC, CMET, TSH at that time.

## 2013-05-23 ENCOUNTER — Other Ambulatory Visit: Payer: Self-pay | Admitting: Pulmonary Disease

## 2013-06-03 ENCOUNTER — Ambulatory Visit (INDEPENDENT_AMBULATORY_CARE_PROVIDER_SITE_OTHER): Payer: Medicare Other | Admitting: Family Medicine

## 2013-06-03 ENCOUNTER — Encounter: Payer: Self-pay | Admitting: Family Medicine

## 2013-06-03 VITALS — BP 122/77 | HR 82 | Temp 98.7°F | Resp 18 | Ht 61.0 in | Wt 141.0 lb

## 2013-06-03 DIAGNOSIS — M25552 Pain in left hip: Secondary | ICD-10-CM

## 2013-06-03 DIAGNOSIS — M25559 Pain in unspecified hip: Secondary | ICD-10-CM | POA: Diagnosis not present

## 2013-06-03 DIAGNOSIS — IMO0001 Reserved for inherently not codable concepts without codable children: Secondary | ICD-10-CM | POA: Diagnosis not present

## 2013-06-03 DIAGNOSIS — M7918 Myalgia, other site: Secondary | ICD-10-CM

## 2013-06-03 DIAGNOSIS — M25551 Pain in right hip: Secondary | ICD-10-CM

## 2013-06-03 MED ORDER — PREDNISONE 20 MG PO TABS: ORAL_TABLET | ORAL | Status: DC

## 2013-06-03 NOTE — Progress Notes (Signed)
OFFICE NOTE  06/03/2013  CC:  Chief Complaint  Patient presents with  . Arthritis     HPI: Patient is a 78 y.o. Caucasian female who is here for pain in gluteal region off and on for several weeks, not bothering her during rainy days, but after rain is finished it hurts a lot.  Pain extends to mid posterior thigh and a bit on lateral thighs but no radiation further down.  With further interview it seems the lateral pain is the most prominent. Denies weakness or paresthesias.  No accident/trauma prior to the pain.  Walking on flat surfaces doesn't bother the regions of complaint.  Getting out of car and going up steps hurts worse. Takes 2 ibup hs, won't take her oxycodone b/c she doesn't like taking it.  Pertinent PMH:  Past medical, surgical, social, and family history reviewed and no changes are noted since last office visit.  MEDS:  Outpatient Prescriptions Prior to Visit  Medication Sig Dispense Refill  . ADVAIR DISKUS 250-50 MCG/DOSE AEPB USE 1 INHALATION BY MOUTH TWICE DAILY  60 each  1  . albuterol (PROAIR HFA) 108 (90 BASE) MCG/ACT inhaler Inhale 2 puffs into the lungs every 4 (four) hours as needed.  1 Inhaler  5  . amLODipine (NORVASC) 5 MG tablet Take 1 tablet (5 mg total) by mouth daily.  90 tablet  3  . aspirin 81 MG tablet Take 81 mg by mouth daily.      . diazepam (VALIUM) 5 MG tablet Take 1 tablet (5 mg total) by mouth every 12 (twelve) hours as needed.  90 tablet  3  . HYDROcodone-homatropine (HYCODAN) 5-1.5 MG/5ML syrup Take 5 mLs by mouth every 6 (six) hours as needed.  120 mL  0  . ipratropium (ATROVENT) 0.03 % nasal spray 2 sprays into each nostril q12h prn  30 mL  1  . loratadine (CLARITIN) 10 MG tablet Take 10 mg by mouth as needed.        Marland Kitchen omeprazole (PRILOSEC) 20 MG capsule Take 20 mg by mouth daily.        . roflumilast (DALIRESP) 500 MCG TABS tablet TAKE 1 TABLET BY MOUTH DAILY  90 tablet  1  . SYNTHROID 88 MCG tablet TAKE 1 TABLET BY MOUTH EVERY DAY  90  tablet  1  . tiotropium (SPIRIVA HANDIHALER) 18 MCG inhalation capsule INHALE 1 CAPSULE VIA HANDIHALER EVERY DAY AT THE SAME TIME  90 capsule  1  . venlafaxine (EFFEXOR) 50 MG tablet Take 1 tablet (50 mg total) by mouth 2 (two) times daily.  60 tablet  1  . conjugated estrogens (PREMARIN) vaginal cream Place vaginally daily.  42.5 g  12  . oxyCODONE-acetaminophen (PERCOCET) 5-325 MG per tablet Take 0.25 tablets by mouth every 6 (six) hours as needed.        No facility-administered medications prior to visit.    PE: Blood pressure 122/77, pulse 82, temperature 98.7 F (37.1 C), temperature source Temporal, resp. rate 18, height 5\' 1"  (1.549 m), weight 141 lb (63.957 kg), SpO2 91.00%. Gen: Alert, well appearing.  Patient is oriented to person, place, time, and situation. AFFECT: pleasant, lucid thought and speech. LB: No lumbar or sacral tenderness.  ROM of L spine intact. She has tenderness to palpation in soft tissue of lateral hip region proximal to the trochanteric bursa region bilaterally. ROM of both hips is mildly painful with ER.  Flexion and extension are OK.  Active and passive ROM bring about the  same response.  No ischial tuberosity tenderness on either side.  No tenderness over either troch burs. Leg strength intact.  IMPRESSION AND PLAN:  1) Bilateral Hip pains, muscular vs osteoarthritic pain. Check hip x-rays. Will do 5d burst of prednisone 40mg  qd. Tylenol [phone] of tylenol tid prn.  An After Visit Summary was printed and given to the patient.  FOLLOW UP: prn

## 2013-06-03 NOTE — Progress Notes (Signed)
Pre visit review using our clinic review tool, if applicable. No additional management support is needed unless otherwise documented below in the visit note. 

## 2013-06-07 ENCOUNTER — Ambulatory Visit (HOSPITAL_BASED_OUTPATIENT_CLINIC_OR_DEPARTMENT_OTHER)
Admission: RE | Admit: 2013-06-07 | Discharge: 2013-06-07 | Disposition: A | Discharge status: Home / Self Care | Payer: Medicare Other | Source: Ambulatory Visit | Attending: Family Medicine | Admitting: Family Medicine

## 2013-06-07 DIAGNOSIS — M47817 Spondylosis without myelopathy or radiculopathy, lumbosacral region: Secondary | ICD-10-CM | POA: Diagnosis not present

## 2013-06-07 DIAGNOSIS — M25552 Pain in left hip: Secondary | ICD-10-CM

## 2013-06-07 DIAGNOSIS — M7918 Myalgia, other site: Secondary | ICD-10-CM

## 2013-06-07 DIAGNOSIS — M169 Osteoarthritis of hip, unspecified: Secondary | ICD-10-CM | POA: Insufficient documentation

## 2013-06-07 DIAGNOSIS — M1610 Unilateral primary osteoarthritis, unspecified hip: Secondary | ICD-10-CM | POA: Insufficient documentation

## 2013-06-07 DIAGNOSIS — M25551 Pain in right hip: Secondary | ICD-10-CM

## 2013-06-12 ENCOUNTER — Other Ambulatory Visit: Payer: Self-pay | Admitting: Family Medicine

## 2013-06-29 ENCOUNTER — Ambulatory Visit: Payer: Medicare Other | Admitting: Family Medicine

## 2013-06-30 ENCOUNTER — Other Ambulatory Visit: Payer: Self-pay | Admitting: Pulmonary Disease

## 2013-07-04 ENCOUNTER — Ambulatory Visit (INDEPENDENT_AMBULATORY_CARE_PROVIDER_SITE_OTHER): Payer: Medicare Other | Admitting: Family Medicine

## 2013-07-04 ENCOUNTER — Encounter: Payer: Self-pay | Admitting: Family Medicine

## 2013-07-04 VITALS — BP 124/78 | HR 78 | Temp 98.2°F | Resp 18

## 2013-07-04 DIAGNOSIS — M4800 Spinal stenosis, site unspecified: Secondary | ICD-10-CM | POA: Diagnosis not present

## 2013-07-04 DIAGNOSIS — M1610 Unilateral primary osteoarthritis, unspecified hip: Secondary | ICD-10-CM

## 2013-07-04 DIAGNOSIS — M1611 Unilateral primary osteoarthritis, right hip: Secondary | ICD-10-CM

## 2013-07-04 DIAGNOSIS — M1612 Unilateral primary osteoarthritis, left hip: Secondary | ICD-10-CM

## 2013-07-04 NOTE — Progress Notes (Signed)
Pre visit review using our clinic review tool, if applicable. No additional management support is needed unless otherwise documented below in the visit note. 

## 2013-07-04 NOTE — Progress Notes (Signed)
OFFICE NOTE  07/04/2013  CC:  Chief Complaint  Patient presents with  . Back Pain  . Leg Pain     HPI: Patient is a 78 y.o. Caucasian female who is here for back of hips hurting, goes down back of thighs sometimes, sometimes sides of thighs and top of quads.  No extension of pain past knee level.  No paresthesias or muscle weakness noted.  Triggers seem to consistently be lots of bending at the waist or hips.  Once she gets to standing position she doesn't hurt.  Also says she can push mow her lawn and it doesn't hurt during or after.  Other times she seems to only be able to take a few steps and occ feels like hips "go out of socket".   Has nagging pain in middle of LB/sacral region nearly all the time--chronic.  Last time she was here we tried a prednisone burst x 5d but this did not seem to help much. She will not use her pain pills---says she doesn't want to resort to this unless it is her last resort and the pain is very severe.  She does sometimes take tylenol, also says rubbing a small amount of diclofenac gel on hip areas/glut areas helps significantly.  Pertinent PMH:  Past Medical History  Diagnosis Date  . Hypertension     EKG 03/2010 normal  . COPD (chronic obstructive pulmonary disease)     spirometry 01/10/09 FEV 0.97(52%), FEV1% 47  . Hypoxemia     O2 86% RA with exertion 01/10/09, 2 liters oxygen with exertion and sleep  . Pulmonary nodule     CT chest 12/10, June 2011.  No nodule on CT 06/2010.  Marland Kitchen Spinal stenosis     Symptomatic (neurogenic claudication)  . GERD (gastroesophageal reflux disease)   . Hypothyroidism     Hashimoto's, dx'd 1989  . Depression   . Insomnia   . Plantar fasciitis   . Diverticulosis of colon     Procto '97 and colonoscopy 2002  . DDD (degenerative disc disease), cervical     1998 MRI C5-6 impingemt (left)  . DDD (degenerative disc disease), lumbar   . Spinal stenosis of lumbar region with neurogenic claudication     2008 MRI (L3-4, L4-5,  L5-S1)  . Fibrocystic breast disease     w/fibroadenoma.  Bx's showed NO atypia (Dr. Margot Chimes).  Mammo neg 2008.  Marland Kitchen Chest pain, non-cardiac     Cardiac CT showed no signif obstructive dz, mildly elevated calcium level (Dr. [name]).  . History of double vision     Ophthalmologist, Dr. Herbert Deaner, is further evaluating this with MRI  orbits and limited brain (myesthenia gravis testing neg 07/2010)  . Depression with anxiety 04/15/2011   Past Surgical History  Procedure Laterality Date  . Cholecystectomy  2001  . Tubal ligation  1974  . Abdominal hysterectomy  1978    no BSO per pt--nonmalignant reasons  . Breast cyst excision      Last screening mammogram 10/2010 was normal.  . Appendectomy    . Tonsillectomy     History   Social History Narrative   Widowed since fall 2014   Lives in [place], [place].     Works as Research officer, trade [employer].      MEDS: Not taking hycodan, prednisone, or percocet listed below Outpatient Prescriptions Prior to Visit  Medication Sig Dispense Refill  . ADVAIR DISKUS 250-50 MCG/DOSE AEPB USE 1 INHALATION BY MOUTH TWICE DAILY  60 each  1  .  albuterol (PROAIR HFA) 108 (90 BASE) MCG/ACT inhaler Inhale 2 puffs into the lungs every 4 (four) hours as needed.  1 Inhaler  5  . amLODipine (NORVASC) 5 MG tablet Take 1 tablet (5 mg total) by mouth daily.  90 tablet  3  . aspirin 81 MG tablet Take 81 mg by mouth daily.      Marland Kitchen conjugated estrogens (PREMARIN) vaginal cream Place vaginally daily.  42.5 g  12  . diazepam (VALIUM) 5 MG tablet Take 1 tablet (5 mg total) by mouth every 12 (twelve) hours as needed.  90 tablet  3  . omeprazole (PRILOSEC) 20 MG capsule Take 20 mg by mouth daily.        Marland Kitchen oxyCODONE-acetaminophen (PERCOCET) 5-325 MG per tablet Take 0.25 tablets by mouth every 6 (six) hours as needed.       . predniSONE (DELTASONE) 20 MG tablet 2 tabs po qd x 5d  10 tablet  0  . roflumilast (DALIRESP) 500 MCG TABS tablet TAKE 1 TABLET BY MOUTH DAILY  90 tablet  1  .  SPIRIVA HANDIHALER 18 MCG inhalation capsule INHALE CONTENTS OF 1 CAPSULE ONCE DAILY USING HANDIHALER AT THE SAME TIME  30 capsule  0  . SYNTHROID 88 MCG tablet TAKE 1 TABLET BY MOUTH EVERY DAY  90 tablet  1  . venlafaxine (EFFEXOR) 50 MG tablet TAKE 1 TABLET BY MOUTH TWICE DAILY  60 tablet  0  . ipratropium (ATROVENT) 0.03 % nasal spray 2 sprays into each nostril q12h prn  30 mL  1  . loratadine (CLARITIN) 10 MG tablet Take 10 mg by mouth as needed.        Marland Kitchen HYDROcodone-homatropine (HYCODAN) 5-1.5 MG/5ML syrup Take 5 mLs by mouth every 6 (six) hours as needed.  120 mL  0   No facility-administered medications prior to visit.    PE: Blood pressure 124/78, pulse 78, temperature 98.2 F (36.8 C), temperature source Oral, resp. rate 18. Gen: Alert, well appearing.  Patient is oriented to person, place, time, and situation. Pushes with arms some to get up, steps slowly onto exam table. No LB TTP.  Posterolateral TTP on left hip area, with right hip more tender around greater troch region. Pain with hip ER>IR bilat, also mild pain in both hips with flexion/extension. ER ROM limited bilat. Sitting SLR neg bilat. LE strength 5/5 prox/dist bilat.    Labs: none today  Recent imaging: Bilat hip with pelvis 06/09/13 Impression: 1. Moderate degenerative changes in the hip joints bilaterally is  fairly symmetric.  2. Slight progression of spondylosis in the lower lumbar spine.  MRI L spine w/o contrast 05/27/11:  Impression: Degenerative lumbar spondylosis with disc disease and facet  disease. Multilevel spinal, lateral recess and foraminal stenosis  as specifically discussed above and most significant at L3-4.   IMPRESSION AND PLAN:  1) Chronic LB and hip pain, with intermittent proximal LE pain: I think the thing bothering her the most lately is hip osteoarthritis.  I will ask orthopedist to see her, in the meantime continue voltaren gel up to qid. She has lumbar spinal stenosis but I can't  convince myself that her current symptoms are suggestive of progression of this condition.  An After Visit Summary was printed and given to the patient.  Spent 25 min with pt today, with >50% of this time spent in counseling and care coordination regarding the above problems.  FOLLOW UP: has appt with me for end of July this year

## 2013-07-11 ENCOUNTER — Other Ambulatory Visit: Payer: Self-pay | Admitting: Family Medicine

## 2013-07-11 NOTE — Telephone Encounter (Signed)
Refill request for venlafaxine Last filled by MD on- 06/12/2013 #60 x0 Last Appt: 07/04/2013 Next Appt: 08/31/2013 Please advise refill?

## 2013-07-14 ENCOUNTER — Other Ambulatory Visit: Payer: Self-pay | Admitting: Family Medicine

## 2013-07-18 ENCOUNTER — Other Ambulatory Visit: Payer: Self-pay | Admitting: Family Medicine

## 2013-07-18 DIAGNOSIS — Z1231 Encounter for screening mammogram for malignant neoplasm of breast: Secondary | ICD-10-CM | POA: Diagnosis not present

## 2013-07-19 ENCOUNTER — Encounter: Payer: Self-pay | Admitting: Nurse Practitioner

## 2013-07-19 ENCOUNTER — Ambulatory Visit (INDEPENDENT_AMBULATORY_CARE_PROVIDER_SITE_OTHER): Payer: Medicare Other | Admitting: Nurse Practitioner

## 2013-07-19 VITALS — BP 103/64 | HR 91 | Temp 98.4°F | Ht 61.0 in | Wt 143.0 lb

## 2013-07-19 DIAGNOSIS — L0291 Cutaneous abscess, unspecified: Secondary | ICD-10-CM | POA: Diagnosis not present

## 2013-07-19 DIAGNOSIS — L0390 Cellulitis, unspecified: Secondary | ICD-10-CM | POA: Diagnosis not present

## 2013-07-19 MED ORDER — DOXYCYCLINE HYCLATE 100 MG PO TABS: 100.0000 mg | ORAL_TABLET | Freq: Two times a day (BID) | ORAL | Status: DC

## 2013-07-19 NOTE — Patient Instructions (Signed)
Start antibiotic.  Watch for spreading redness-after 24 hours should not get larger. If it does, let us know. Wash daily with [employer]. Pat dry. Follow up w/Dr McGowen next week.

## 2013-07-19 NOTE — Progress Notes (Signed)
   Subjective:    Patient ID: Priscilla Cantrell, female    DOB: 1935/03/04, 78 y.o.   MRN: [id number]  Rash This is a new problem. The current episode started yesterday. The problem has been gradually worsening since onset. The affected locations include the left arm. The rash is characterized by itchiness, redness and swelling. She was exposed to an insect bite/sting (did not see insect). Pertinent negatives include no congestion, cough, diarrhea, fatigue, fever, joint pain or shortness of breath. Past treatments include nothing.      Review of Systems  Constitutional: Negative for fever and fatigue.  HENT: Negative for congestion.   Respiratory: Negative for cough and shortness of breath.   Cardiovascular: Negative for chest pain and palpitations.  Gastrointestinal: Negative for abdominal pain and diarrhea.  Musculoskeletal: Negative for arthralgias, joint pain and neck stiffness.  Skin: Positive for rash.  Neurological: Negative for headaches.       Objective:   Physical Exam  Constitutional: She is oriented to person, place, and time. She appears well-developed and well-nourished. No distress.  HENT:  Head: Normocephalic and atraumatic.  Eyes: Conjunctivae are normal. Right eye exhibits no discharge. Left eye exhibits no discharge.  Cardiovascular: Normal rate, regular rhythm and normal heart sounds.   No murmur heard. Pulmonary/Chest: Effort normal and breath sounds normal. No respiratory distress. She has no wheezes. She has no rales.  Neurological: She is alert and oriented to person, place, and time.  Skin: Skin is warm and dry. Rash noted.     Psychiatric: She has a normal mood and affect. Her behavior is normal. Thought content normal.          Assessment & Plan:  1. Cellulitis Insect bite - doxycycline (VIBRA-TABS) 100 MG tablet; Take 1 tablet (100 mg total) by mouth 2 (two) times daily.  Dispense: 14 tablet; Refill: 0 See pt instructions.

## 2013-07-19 NOTE — Progress Notes (Signed)
Pre visit review using our clinic review tool, if applicable. No additional management support is needed unless otherwise documented below in the visit note. 

## 2013-07-27 ENCOUNTER — Encounter: Payer: Self-pay | Admitting: Family Medicine

## 2013-07-27 ENCOUNTER — Ambulatory Visit (INDEPENDENT_AMBULATORY_CARE_PROVIDER_SITE_OTHER): Payer: Medicare Other | Admitting: Family Medicine

## 2013-07-27 VITALS — BP 114/70 | HR 88 | Temp 97.5°F | Resp 18 | Ht 61.0 in | Wt 140.0 lb

## 2013-07-27 DIAGNOSIS — IMO0002 Reserved for concepts with insufficient information to code with codable children: Secondary | ICD-10-CM | POA: Diagnosis not present

## 2013-07-27 DIAGNOSIS — M4800 Spinal stenosis, site unspecified: Secondary | ICD-10-CM

## 2013-07-27 DIAGNOSIS — M160 Bilateral primary osteoarthritis of hip: Secondary | ICD-10-CM | POA: Insufficient documentation

## 2013-07-27 DIAGNOSIS — L03114 Cellulitis of left upper limb: Secondary | ICD-10-CM

## 2013-07-27 DIAGNOSIS — M1610 Unilateral primary osteoarthritis, unspecified hip: Secondary | ICD-10-CM

## 2013-07-27 NOTE — Progress Notes (Signed)
OFFICE NOTE  07/27/2013  CC:  Chief Complaint  Patient presents with  . Cellulitis    left arm     HPI: Patient is a 78 y.o. Caucasian female who is here for 1 wk f/u cellulitis.  Feeling no more pain or discomfort in left forearm. No skin abnormality anymore.  No fever/chills/malaise.  Took full course of abx.  Says backs of both hips bothering her, worse when sitting down.  Still some vague, intermittent LE pains---seems to be more in joints, not so much low back.  She wonders about another 5d course of prednisone b/c she feels like this has helped in the past.  However, she uses 400mg  ibuprofen qd -bid and this does help the pain.  Pertinent PMH:  Past medical, surgical, social, and family history reviewed and no changes are noted since last office visit.  MEDS:  Outpatient Prescriptions Prior to Visit  Medication Sig Dispense Refill  . ADVAIR DISKUS 250-50 MCG/DOSE AEPB USE 1 INHALATION BY MOUTH TWICE DAILY  60 each  1  . albuterol (PROAIR HFA) 108 (90 BASE) MCG/ACT inhaler Inhale 2 puffs into the lungs every 4 (four) hours as needed.  1 Inhaler  5  . amLODipine (NORVASC) 5 MG tablet Take 1 tablet (5 mg total) by mouth daily.  90 tablet  3  . aspirin 81 MG tablet Take 81 mg by mouth daily.      Marland Kitchen conjugated estrogens (PREMARIN) vaginal cream Place vaginally daily.  42.5 g  12  . DALIRESP 500 MCG TABS tablet Take 1 tablet by mouth  daily  90 tablet  1  . diazepam (VALIUM) 5 MG tablet Take 1 tablet (5 mg total) by mouth every 12 (twelve) hours as needed.  90 tablet  3  . doxycycline (VIBRA-TABS) 100 MG tablet Take 1 tablet (100 mg total) by mouth 2 (two) times daily.  14 tablet  0  . ipratropium (ATROVENT) 0.03 % nasal spray 2 sprays into each nostril q12h prn  30 mL  1  . loratadine (CLARITIN) 10 MG tablet Take 10 mg by mouth as needed.        Marland Kitchen omeprazole (PRILOSEC) 20 MG capsule Take 20 mg by mouth daily.        Marland Kitchen oxyCODONE-acetaminophen (PERCOCET) 5-325 MG per tablet Take 0.25  tablets by mouth every 6 (six) hours as needed.       Marland Kitchen SPIRIVA HANDIHALER 18 MCG inhalation capsule INHALE CONTENTS OF 1 CAPSULE ONCE DAILY USING HANDIHALER AT THE SAME TIME  30 capsule  0  . SYNTHROID 88 MCG tablet TAKE 1 TABLET BY MOUTH EVERY DAY  30 tablet  0  . venlafaxine (EFFEXOR) 50 MG tablet TAKE 1 TABLET BY MOUTH TWICE DAILY  60 tablet  11   No facility-administered medications prior to visit.    PE: Blood pressure 114/70, pulse 88, temperature 97.5 F (36.4 C), temperature source Oral, resp. rate 18, height 5\' 1"  (1.549 m), weight 140 lb (63.504 kg), SpO2 94.00%. Gen: Alert, well appearing.  Patient is oriented to person, place, time, and situation. Left arm: no warmth, erythema, tenderness, or induration.    IMPRESSION AND PLAN:  Cellulitis: resolved  Chronic LBP with hx of spinal stenosis, and hip osteoarthritis worsening lately: I encouraged her to continue ibup 400mg  bid-tid with food as long as this is helping.  She has appt with ortho for further eval later this summer.  An After Visit Summary was printed and given to the patient.  FOLLOW  UP: already set.

## 2013-08-08 DIAGNOSIS — M549 Dorsalgia, unspecified: Secondary | ICD-10-CM | POA: Diagnosis not present

## 2013-08-08 DIAGNOSIS — M25559 Pain in unspecified hip: Secondary | ICD-10-CM | POA: Diagnosis not present

## 2013-08-09 ENCOUNTER — Other Ambulatory Visit: Payer: Self-pay | Admitting: Orthopedic Surgery

## 2013-08-09 DIAGNOSIS — M549 Dorsalgia, unspecified: Principal | ICD-10-CM

## 2013-08-09 DIAGNOSIS — G8929 Other chronic pain: Secondary | ICD-10-CM

## 2013-08-11 ENCOUNTER — Telehealth: Payer: Self-pay | Admitting: Family Medicine

## 2013-08-11 ENCOUNTER — Ambulatory Visit: Payer: Medicare Other | Admitting: Pulmonary Disease

## 2013-08-11 ENCOUNTER — Encounter: Payer: Self-pay | Admitting: Pulmonary Disease

## 2013-08-11 VITALS — BP 122/76 | HR 74 | Ht 61.5 in | Wt 143.8 lb

## 2013-08-11 DIAGNOSIS — R0902 Hypoxemia: Secondary | ICD-10-CM

## 2013-08-11 DIAGNOSIS — J449 Chronic obstructive pulmonary disease, unspecified: Secondary | ICD-10-CM

## 2013-08-11 NOTE — Telephone Encounter (Signed)
Patient called in just to let Dr. Anitra Lauth know that she is having additional testing on her back, she likes to "keep him in the know".  Patient not requesting a CB.

## 2013-08-11 NOTE — Telephone Encounter (Signed)
noted 

## 2013-08-11 NOTE — Progress Notes (Signed)
Chief Complaint  Patient presents with  . Follow-up    Pt states that overall, breathing has been doing well and endurance levels are increased. Pt reports staying active.     History of Present Illness: Priscilla Cantrell is a 78 y.o. female former smoker with GOLD 4 COPD, ACE related cough, and hypoxemia on 2 liters oxygen with exertion and sleep.  Her breathing has been okay.  She tries to stay active.  This helps her cope after the loss of her husband.  Her mood has improved since last visit.  She is not having cough, wheeze, or sputum.  She does not need to use albuterol much.  She is not having diarrhea.  She uses her oxygen at night.  Her main difficulty is related to back pain from spinal stenosis.  She is followed by Dr. Gladstone Lighter with orthopedics for this.  She has been taking prednisone for this.  Tests: Spirometry 01/10/09>>FEV1 0.97(52%), FEV1% 47 March 2012>>Daliresp started PSG 11/14/11 >> AHI 0.3  She  has a past medical history of Hypertension; COPD (chronic obstructive pulmonary disease); Hypoxemia; Pulmonary nodule; Spinal stenosis; GERD (gastroesophageal reflux disease); Hypothyroidism; Depression; Insomnia; Plantar fasciitis; Diverticulosis of colon; DDD (degenerative disc disease), cervical; DDD (degenerative disc disease), lumbar; Spinal stenosis of lumbar region with neurogenic claudication; Fibrocystic breast disease; Chest pain, non-cardiac; History of double vision; and Depression with anxiety (04/15/2011).  She  has past surgical history that includes Cholecystectomy (2001); Tubal ligation (1974); Abdominal hysterectomy (1978); Breast cyst excision; Appendectomy; and Tonsillectomy.  Current Outpatient Prescriptions on File Prior to Visit  Medication Sig Dispense Refill  . ADVAIR DISKUS 250-50 MCG/DOSE AEPB USE 1 INHALATION BY MOUTH TWICE DAILY  60 each  1  . albuterol (PROAIR HFA) 108 (90 BASE) MCG/ACT inhaler Inhale 2 puffs into the lungs every 4 (four) hours as  needed.  1 Inhaler  5  . amLODipine (NORVASC) 5 MG tablet Take 1 tablet (5 mg total) by mouth daily.  90 tablet  3  . aspirin 81 MG tablet Take 81 mg by mouth daily.      Marland Kitchen conjugated estrogens (PREMARIN) vaginal cream Place vaginally daily.  42.5 g  12  . DALIRESP 500 MCG TABS tablet Take 1 tablet by mouth  daily  90 tablet  1  . diazepam (VALIUM) 5 MG tablet Take 1 tablet (5 mg total) by mouth every 12 (twelve) hours as needed.  90 tablet  3  . doxycycline (VIBRA-TABS) 100 MG tablet Take 1 tablet (100 mg total) by mouth 2 (two) times daily.  14 tablet  0  . ipratropium (ATROVENT) 0.03 % nasal spray 2 sprays into each nostril q12h prn  30 mL  1  . loratadine (CLARITIN) 10 MG tablet Take 10 mg by mouth as needed.        Marland Kitchen omeprazole (PRILOSEC) 20 MG capsule Take 20 mg by mouth daily.        Marland Kitchen oxyCODONE-acetaminophen (PERCOCET) 5-325 MG per tablet Take 0.25 tablets by mouth every 6 (six) hours as needed.       Marland Kitchen SPIRIVA HANDIHALER 18 MCG inhalation capsule INHALE CONTENTS OF 1 CAPSULE ONCE DAILY USING HANDIHALER AT THE SAME TIME  30 capsule  0  . SYNTHROID 88 MCG tablet TAKE 1 TABLET BY MOUTH EVERY DAY  30 tablet  0  . venlafaxine (EFFEXOR) 50 MG tablet TAKE 1 TABLET BY MOUTH TWICE DAILY  60 tablet  11   No current facility-administered medications on file prior to visit.  Allergies  Allergen Reactions  . Ace Inhibitors     REACTION: cough  . Cefixime [Kdc:Cefixime] Other (See Comments)    unspecified  . Losartan Other (See Comments)    hyperkalemia  . Penicillins     REACTION: rash, hives, swelling, welts  . Statins   . Sulfa Antibiotics Other (See Comments)    unspecified    Physical Exam:  General - Thin HEENT - No sinus tenderness, no oral exudate, no LAN Cardiac - s1s2 no murmur Chest - prolonged exhalation, no wheeze/rales Abdomen - soft, nontender Extremities - no edema Skin - no rashes Neurologic - normal strength Psychiatric - normal mood,  behavior   Assessment/Plan:  Chesley Mires, MD [place] Pager:  [phone]

## 2013-08-11 NOTE — Patient Instructions (Signed)
Follow up in 6 months 

## 2013-08-12 ENCOUNTER — Other Ambulatory Visit: Payer: Self-pay | Admitting: Pulmonary Disease

## 2013-08-13 NOTE — Assessment & Plan Note (Signed)
Stable on current regimen of spiriva, advair, daliresp, and prn proair.

## 2013-08-13 NOTE — Assessment & Plan Note (Signed)
Continue supplemental oxygen at night and as needed during the day.

## 2013-08-15 ENCOUNTER — Encounter: Payer: Self-pay | Admitting: Family Medicine

## 2013-08-26 ENCOUNTER — Ambulatory Visit
Admission: RE | Admit: 2013-08-26 | Discharge: 2013-08-26 | Disposition: A | Discharge status: Home / Self Care | Payer: Medicare Other | Source: Ambulatory Visit | Attending: Orthopedic Surgery | Admitting: Orthopedic Surgery

## 2013-08-26 VITALS — BP 92/53 | HR 80

## 2013-08-26 DIAGNOSIS — M545 Low back pain, unspecified: Secondary | ICD-10-CM | POA: Diagnosis not present

## 2013-08-26 DIAGNOSIS — G8929 Other chronic pain: Secondary | ICD-10-CM

## 2013-08-26 DIAGNOSIS — M4310 Spondylolisthesis, site unspecified: Secondary | ICD-10-CM | POA: Diagnosis not present

## 2013-08-26 DIAGNOSIS — M549 Dorsalgia, unspecified: Principal | ICD-10-CM

## 2013-08-26 DIAGNOSIS — M5126 Other intervertebral disc displacement, lumbar region: Secondary | ICD-10-CM | POA: Diagnosis not present

## 2013-08-26 DIAGNOSIS — M48061 Spinal stenosis, lumbar region without neurogenic claudication: Secondary | ICD-10-CM | POA: Diagnosis not present

## 2013-08-26 DIAGNOSIS — M47817 Spondylosis without myelopathy or radiculopathy, lumbosacral region: Secondary | ICD-10-CM | POA: Diagnosis not present

## 2013-08-26 DIAGNOSIS — M4800 Spinal stenosis, site unspecified: Secondary | ICD-10-CM

## 2013-08-26 MED ORDER — ONDANSETRON HCL 4 MG/2ML IJ SOLN
4.0000 mg | Freq: Once | INTRAMUSCULAR | Status: AC
  Administered 2013-08-26: 4 mg via INTRAMUSCULAR

## 2013-08-26 MED ORDER — DIAZEPAM 5 MG PO TABS
5.0000 mg | ORAL_TABLET | Freq: Once | ORAL | Status: AC
  Administered 2013-08-26: 5 mg via ORAL

## 2013-08-26 MED ORDER — MEPERIDINE HCL 100 MG/ML IJ SOLN
75.0000 mg | Freq: Once | INTRAMUSCULAR | Status: AC
  Administered 2013-08-26: 75 mg via INTRAMUSCULAR

## 2013-08-26 MED ORDER — IOHEXOL 180 MG/ML  SOLN
18.0000 mL | Freq: Once | INTRAMUSCULAR | Status: AC | PRN
  Administered 2013-08-26: 18 mL via INTRATHECAL

## 2013-08-26 NOTE — Discharge Instructions (Signed)
Myelogram Discharge Instructions  1. Go home and rest quietly for the next 24 hours.  It is important to lie flat for the next 24 hours.  Get up only to go to the restroom.  You may lie in the bed or on a couch on your back, your stomach, your left side or your right side.  You may have one pillow under your head.  You may have pillows between your knees while you are on your side or under your knees while you are on your back.  2. DO NOT drive today.  Recline the seat as far back as it will go, while still wearing your seat belt, on the way home.  3. You may get up to go to the bathroom as needed.  You may sit up for 10 minutes to eat.  You may resume your normal diet and medications unless otherwise indicated.  Drink plenty of extra fluids today and tomorrow.  4. The incidence of a spinal headache with nausea and/or vomiting is about 5% (one in 20 patients).  If you develop a headache, lie flat and drink plenty of fluids until the headache goes away.  Caffeinated beverages may be helpful.  If you develop severe nausea and vomiting or a headache that does not go away with flat bed rest, call [phone].  5. You may resume normal activities after your 24 hours of bed rest is over; however, do not exert yourself strongly or do any heavy lifting tomorrow.  6. Call your physician for a follow-up appointment.   You may resume Venlafaxine on Saturday, August 27, 2013 after 1:00p.m.

## 2013-08-26 NOTE — Progress Notes (Signed)
Patient states she has been off Venlafaxine for the past two days.  jkl

## 2013-08-31 ENCOUNTER — Encounter: Payer: Self-pay | Admitting: Family Medicine

## 2013-08-31 ENCOUNTER — Ambulatory Visit (INDEPENDENT_AMBULATORY_CARE_PROVIDER_SITE_OTHER): Payer: Medicare Other | Admitting: Family Medicine

## 2013-08-31 VITALS — BP 130/85 | HR 70 | Temp 98.2°F | Resp 16 | Ht 61.0 in | Wt 142.0 lb

## 2013-08-31 DIAGNOSIS — M545 Low back pain, unspecified: Secondary | ICD-10-CM | POA: Diagnosis not present

## 2013-08-31 DIAGNOSIS — G8929 Other chronic pain: Secondary | ICD-10-CM

## 2013-08-31 DIAGNOSIS — J438 Other emphysema: Secondary | ICD-10-CM

## 2013-08-31 DIAGNOSIS — I10 Essential (primary) hypertension: Secondary | ICD-10-CM

## 2013-08-31 DIAGNOSIS — E039 Hypothyroidism, unspecified: Secondary | ICD-10-CM | POA: Diagnosis not present

## 2013-08-31 DIAGNOSIS — M4800 Spinal stenosis, site unspecified: Secondary | ICD-10-CM

## 2013-08-31 LAB — LIPID PANEL
Cholesterol: 195 mg/dL (ref 0–200)
HDL: 62.5 mg/dL (ref >39.00)
LDL Cholesterol: 106 mg/dL — ABNORMAL HIGH (ref 0–99)
NonHDL: 132.5
Total CHOL/HDL Ratio: 3
Triglycerides: 132 mg/dL (ref 0.0–149.0)
VLDL: 26.4 mg/dL (ref 0.0–40.0)

## 2013-08-31 LAB — BASIC METABOLIC PANEL
BUN: 10 mg/dL (ref 6–23)
CO2: 29 mEq/L (ref 19–32)
Calcium: 9.6 mg/dL (ref 8.4–10.5)
Chloride: 103 mEq/L (ref 96–112)
Creatinine, Ser: 0.7 mg/dL (ref 0.4–1.2)
GFR: 82.31 mL/min (ref >60.00)
Glucose, Bld: 77 mg/dL (ref 70–99)
Potassium: 4.4 mEq/L (ref 3.5–5.1)
Sodium: 138 mEq/L (ref 135–145)

## 2013-08-31 LAB — TSH: TSH: 2.32 u[IU]/mL (ref 0.35–4.50)

## 2013-08-31 NOTE — Progress Notes (Signed)
OFFICE NOTE  08/31/2013  CC:  Chief Complaint  Patient presents with  . Follow-up   HPI: Patient is a 78 y.o. Caucasian female who is here for 4 mo f/u COPD, hypothyroidism, GERD, HTN, and anxiety/depression. Latest big issue has been worsening LBP with neurogenic claudication. LBP mild, but glut/buttocks pain prominent, particularly with ambulating and getting up from a chair. Occ radiating pain down legs to the level of the ankle/foot at times.   Saw ortho PA, CT myelogram was done and it showed a few levels of signif spondylosis/spondylolisthesis, and spinal nerve impingement.  Has appt with Dr. Gladstone Lighter in 5d to discuss this. Pain is well controlled with use of aspercream and tylenol or ibuprofen.   Pertinent PMH:  Past medical, surgical, social, and family history reviewed and no changes are noted since last office visit.  MEDS: Not on doxy or prednisone listed below Outpatient Prescriptions Prior to Visit  Medication Sig Dispense Refill  . ADVAIR DISKUS 250-50 MCG/DOSE AEPB USE 1 INHALATION BY MOUTH TWICE DAILY  1 each  6  . albuterol (PROAIR HFA) 108 (90 BASE) MCG/ACT inhaler Inhale 2 puffs into the lungs every 4 (four) hours as needed.  1 Inhaler  5  . amLODipine (NORVASC) 5 MG tablet Take 1 tablet (5 mg total) by mouth daily.  90 tablet  3  . aspirin 81 MG tablet Take 81 mg by mouth daily.      Marland Kitchen conjugated estrogens (PREMARIN) vaginal cream Place vaginally daily.  42.5 g  12  . DALIRESP 500 MCG TABS tablet Take 1 tablet by mouth  daily  90 tablet  1  . diazepam (VALIUM) 5 MG tablet Take 1 tablet (5 mg total) by mouth every 12 (twelve) hours as needed.  90 tablet  3  . ipratropium (ATROVENT) 0.03 % nasal spray 2 sprays into each nostril q12h prn  30 mL  1  . loratadine (CLARITIN) 10 MG tablet Take 10 mg by mouth as needed.        Marland Kitchen omeprazole (PRILOSEC) 20 MG capsule Take 20 mg by mouth daily.        Marland Kitchen oxyCODONE-acetaminophen (PERCOCET) 5-325 MG per tablet Take 0.25 tablets  by mouth every 6 (six) hours as needed.       Marland Kitchen SPIRIVA HANDIHALER 18 MCG inhalation capsule INHALE CONTENTS OF 1 CAPSULE ONCE DAILY USING HANDIHALER AT THE SAME TIME  30 capsule  0  . SYNTHROID 88 MCG tablet TAKE 1 TABLET BY MOUTH EVERY DAY  30 tablet  0  . venlafaxine (EFFEXOR) 50 MG tablet TAKE 1 TABLET BY MOUTH TWICE DAILY  60 tablet  11  . doxycycline (VIBRA-TABS) 100 MG tablet Take 1 tablet (100 mg total) by mouth 2 (two) times daily.  14 tablet  0  . predniSONE (DELTASONE) 10 MG tablet Take 10 mg by mouth as directed. Current dose 30mg  (08/11/13)       No facility-administered medications prior to visit.    PE: Blood pressure 130/85, pulse 70, temperature 98.2 F (36.8 C), temperature source Temporal, resp. rate 16, height 5\' 1"  (1.549 m), weight 142 lb (64.411 kg), SpO2 94.00%.Examined with CMA Ival Bible as chaperone. Gen: Alert, well appearing.  Patient is oriented to person, place, time, and situation. AFFECT: pleasant, lucid thought and speech. No ischial tuberosity TTP.  Heel walking is normal.  Toe lifts: she fatigues a bit earlier on left foot than right but shows minimal trouble doing this maneuver.  Patellar DTRs 3+ bilat.  Right achilles DTR 1+, left achilles DTR trace. FABER maneuver showed stiffness and markedly limited ROM in hips  IMPRESSION AND PLAN:  1) HTN; The current medical regimen is effective;  continue present plan and medications. BMET today.  2) COPD; The current medical regimen is effective;  continue present plan and medications + oxygen hs.  3) Hypothyroidism: recheck TSH today.  4) Depression w/anxiety.  Doing pretty well.  Continue current meds.  She is keeping busy.  5) Chronic low back pain with bilat radiculopathy.  Recent CT myelogram with multilevel spondylosis w/spinal nerve impingement.  Has f/u with Dr. Gladstone Lighter to discuss this imaging and her options for treatment in 5d.  An After Visit Summary was printed and given to the  patient.  FOLLOW UP: 94mo

## 2013-08-31 NOTE — Progress Notes (Signed)
Pre visit review using our clinic review tool, if applicable. No additional management support is needed unless otherwise documented below in the visit note. 

## 2013-09-04 ENCOUNTER — Other Ambulatory Visit: Payer: Self-pay | Admitting: Family Medicine

## 2013-09-05 DIAGNOSIS — M48061 Spinal stenosis, lumbar region without neurogenic claudication: Secondary | ICD-10-CM | POA: Diagnosis not present

## 2013-09-14 ENCOUNTER — Ambulatory Visit (HOSPITAL_BASED_OUTPATIENT_CLINIC_OR_DEPARTMENT_OTHER)
Admission: RE | Admit: 2013-09-14 | Discharge: 2013-09-14 | Disposition: A | Discharge status: Home / Self Care | Payer: Medicare Other | Source: Ambulatory Visit | Attending: Family Medicine | Admitting: Family Medicine

## 2013-09-14 ENCOUNTER — Encounter: Payer: Self-pay | Admitting: Family Medicine

## 2013-09-14 ENCOUNTER — Ambulatory Visit (INDEPENDENT_AMBULATORY_CARE_PROVIDER_SITE_OTHER): Payer: Medicare Other | Admitting: Family Medicine

## 2013-09-14 VITALS — BP 119/71 | HR 87 | Temp 98.6°F | Resp 18 | Ht 61.0 in | Wt 143.0 lb

## 2013-09-14 DIAGNOSIS — R609 Edema, unspecified: Secondary | ICD-10-CM | POA: Diagnosis not present

## 2013-09-14 DIAGNOSIS — M79609 Pain in unspecified limb: Secondary | ICD-10-CM

## 2013-09-14 DIAGNOSIS — M7989 Other specified soft tissue disorders: Secondary | ICD-10-CM

## 2013-09-14 DIAGNOSIS — M79662 Pain in left lower leg: Secondary | ICD-10-CM

## 2013-09-14 NOTE — Progress Notes (Signed)
Pre visit review using our clinic review tool, if applicable. No additional management support is needed unless otherwise documented below in the visit note. 

## 2013-09-14 NOTE — Progress Notes (Signed)
OFFICE NOTE  09/14/2013  CC:  Chief Complaint  Patient presents with  . Leg Swelling    left leg   HPI: Patient is a 78 y.o. Caucasian female who is here for onset of left lower leg swelling 2 days ago, it is down a little bit today compared to yesterday.  It felt uncomfortable due to the swelling yesterday, also some pain in back of leg behind knee. She elevated it last night, put ice on it.  No recent prolonged immobilization.  No recent trauma.  No recent surgery. She has no hx of DVT or PE.  NO SOB, no fever, no leg rash.  Pertinent PMH:  Past medical, surgical, social, and family history reviewed and no changes are noted since last office visit.  MEDS:  Outpatient Prescriptions Prior to Visit  Medication Sig Dispense Refill  . ADVAIR DISKUS 250-50 MCG/DOSE AEPB USE 1 INHALATION BY MOUTH TWICE DAILY  1 each  6  . albuterol (PROAIR HFA) 108 (90 BASE) MCG/ACT inhaler Inhale 2 puffs into the lungs every 4 (four) hours as needed.  1 Inhaler  5  . amLODipine (NORVASC) 5 MG tablet Take 1 tablet (5 mg total) by mouth daily.  90 tablet  3  . aspirin 81 MG tablet Take 81 mg by mouth daily.      Marland Kitchen conjugated estrogens (PREMARIN) vaginal cream Place vaginally daily.  42.5 g  12  . DALIRESP 500 MCG TABS tablet Take 1 tablet by mouth  daily  90 tablet  1  . diazepam (VALIUM) 5 MG tablet Take 1 tablet (5 mg total) by mouth every 12 (twelve) hours as needed.  90 tablet  3  . ipratropium (ATROVENT) 0.03 % nasal spray 2 sprays into each nostril q12h prn  30 mL  1  . loratadine (CLARITIN) 10 MG tablet Take 10 mg by mouth as needed.        Marland Kitchen omeprazole (PRILOSEC) 20 MG capsule Take 20 mg by mouth daily.        Marland Kitchen oxyCODONE-acetaminophen (PERCOCET) 5-325 MG per tablet Take 0.25 tablets by mouth every 6 (six) hours as needed.       Marland Kitchen SPIRIVA HANDIHALER 18 MCG inhalation capsule INHALE CONTENTS OF 1 CAPSULE ONCE DAILY USING HANDIHALER AT THE SAME TIME  30 capsule  0  . SYNTHROID 88 MCG tablet TAKE 1  TABLET BY MOUTH EVERY DAY  30 tablet  0  . venlafaxine (EFFEXOR) 50 MG tablet TAKE 1 TABLET BY MOUTH TWICE DAILY  60 tablet  11   No facility-administered medications prior to visit.    PE: Blood pressure 119/71, pulse 87, temperature 98.6 F (37 C), temperature source Temporal, resp. rate 18, height 5\' 1"  (1.549 m), weight 143 lb (64.864 kg), SpO2 93.00%. Gen: Alert, well appearing.  Patient is oriented to person, place, time, and situation. Right LEG with scattered lower leg varicose veins w/out inflammation but no edema. Left leg with tenderness and fullness but no distinct mass in left popliteal fossa. 2+ pitting edema in left LE from mid tibia level down into ankle.  No rash.    IMPRESSION AND PLAN:  Acute left lower leg edema and discomfort, need to r/o DVT. Could also be ruptured Baker's cyst. Arranged Left LE venous u/s to eval for DVT today.  An After Visit Summary was printed and given to the patient.  FOLLOW UP: To be determined based on results of pending workup.

## 2013-09-20 ENCOUNTER — Telehealth: Payer: Self-pay | Admitting: Family Medicine

## 2013-09-20 MED ORDER — AZITHROMYCIN 250 MG PO TABS: ORAL_TABLET | ORAL | Status: DC

## 2013-09-20 MED ORDER — PREDNISONE 20 MG PO TABS: ORAL_TABLET | ORAL | Status: DC

## 2013-09-20 NOTE — Telephone Encounter (Signed)
Pt LMOM stating she is starting to come down with bronchitis and wanted to know if you could send in z-pak.  Please advise.

## 2013-09-20 NOTE — Telephone Encounter (Signed)
Left detailed message on machine for patient.   Okay per DPR.

## 2013-09-20 NOTE — Telephone Encounter (Signed)
OK. I eRx'd a Z pack and a prednisone taper that I recommend she take as well.-thx

## 2013-09-26 DIAGNOSIS — M48061 Spinal stenosis, lumbar region without neurogenic claudication: Secondary | ICD-10-CM | POA: Diagnosis not present

## 2013-10-02 ENCOUNTER — Other Ambulatory Visit: Payer: Self-pay | Admitting: Family Medicine

## 2013-10-03 NOTE — Telephone Encounter (Signed)
Request for valium RF.  Patient last OV was 09/14/13.  Last Rx printed 12/17/12 x 3 rfs.  Please advise.

## 2013-10-06 ENCOUNTER — Telehealth: Payer: Self-pay | Admitting: Family Medicine

## 2013-10-06 NOTE — Telephone Encounter (Signed)
Spoke with pt, advised message from [place]. Pt understood. 

## 2013-10-06 NOTE — Telephone Encounter (Signed)
Cough syrups can bump up blood pressure and cause drowsiness. Neither is safe with her underlying condition of hypertension and other meds: valium & oxycodone.  She can use cough drops. Often cough is from post-nasal drip & gets worse when lying down. She should consider starting sinus rinse daily for 5-6 days.

## 2013-10-06 NOTE — Telephone Encounter (Signed)
Pt has developed a very bad dry cough.  Pt has been at hospital w/ her daughter and she thinks maybe she picked up a bug.  She was not sure what OTC cough medication she can take along w/ her medications.  Can you please advise?

## 2013-10-07 ENCOUNTER — Ambulatory Visit (INDEPENDENT_AMBULATORY_CARE_PROVIDER_SITE_OTHER): Payer: Medicare Other | Admitting: Family Medicine

## 2013-10-07 ENCOUNTER — Encounter: Payer: Self-pay | Admitting: Family Medicine

## 2013-10-07 VITALS — BP 106/69 | HR 86 | Temp 98.6°F | Resp 18 | Ht 61.0 in | Wt 144.0 lb

## 2013-10-07 DIAGNOSIS — J441 Chronic obstructive pulmonary disease with (acute) exacerbation: Secondary | ICD-10-CM

## 2013-10-07 DIAGNOSIS — J069 Acute upper respiratory infection, unspecified: Secondary | ICD-10-CM | POA: Diagnosis not present

## 2013-10-07 MED ORDER — LEVOFLOXACIN 500 MG PO TABS: 500.0000 mg | ORAL_TABLET | Freq: Every day | ORAL | Status: DC

## 2013-10-07 MED ORDER — PREDNISONE 20 MG PO TABS: ORAL_TABLET | ORAL | Status: DC

## 2013-10-07 MED ORDER — ALBUTEROL SULFATE HFA 108 (90 BASE) MCG/ACT IN AERS: 2.0000 | INHALATION_SPRAY | RESPIRATORY_TRACT | Status: DC | PRN

## 2013-10-07 NOTE — Progress Notes (Signed)
Pre visit review using our clinic review tool, if applicable. No additional management support is needed unless otherwise documented below in the visit note. 

## 2013-10-07 NOTE — Progress Notes (Signed)
OFFICE NOTE  10/07/2013  CC:  Chief Complaint  Patient presents with  . Cough     HPI: Patient is a 78 y.o. Caucasian female who is here for cough. She had called reporting sx's and requested z pack 09/20/13 so I called this in as well as a 10d prednisone taper. She had improved quite a bit but then 3 d/a she had onset of ST, runny nose, cough, worsening as time goes on.  Today the cough is productive.  Taking mucinex qAM and qPM.  No fever.  Today her fatigue has improved a little compared to yesterday. Oxygen sat on RA at home 90-93 at home.  She wears her oxygen at night.  Recently her ortho put her on indomethacin and this helped her LB pain but caused painful tongue and throat.  She stopped the med and tongue went back to normal.  She is awaiting him to call in a different anti-inflammitory.   Pertinent PMH:  Past medical, surgical, social, and family history reviewed and no changes are noted since last office visit.  MEDS: Not taking azithromycin or prednisone listed below Outpatient Prescriptions Prior to Visit  Medication Sig Dispense Refill  . ADVAIR DISKUS 250-50 MCG/DOSE AEPB USE 1 INHALATION BY MOUTH TWICE DAILY  1 each  6  . albuterol (PROAIR HFA) 108 (90 BASE) MCG/ACT inhaler Inhale 2 puffs into the lungs every 4 (four) hours as needed.  1 Inhaler  5  . amLODipine (NORVASC) 5 MG tablet Take 1 tablet (5 mg total) by mouth daily.  90 tablet  3  . aspirin 81 MG tablet Take 81 mg by mouth daily.      Marland Kitchen azithromycin (ZITHROMAX) 250 MG tablet 2 tabs po qd x 1d, then 1 tab po qd x 4d  6 tablet  0  . conjugated estrogens (PREMARIN) vaginal cream Place vaginally daily.  42.5 g  12  . DALIRESP 500 MCG TABS tablet Take 1 tablet by mouth  daily  90 tablet  1  . diazepam (VALIUM) 5 MG tablet TAKE 1 TABLET BY MOUTH EVERY 12 HOURS AS NEEDED  90 tablet  0  . ipratropium (ATROVENT) 0.03 % nasal spray 2 sprays into each nostril q12h prn  30 mL  1  . loratadine (CLARITIN) 10 MG tablet Take  10 mg by mouth as needed.        Marland Kitchen omeprazole (PRILOSEC) 20 MG capsule Take 20 mg by mouth daily.        Marland Kitchen oxyCODONE-acetaminophen (PERCOCET) 5-325 MG per tablet Take 0.25 tablets by mouth every 6 (six) hours as needed.       . predniSONE (DELTASONE) 20 MG tablet 1 tab po bid x 3d, then 1 tab po qd x 3d, then 1/2 tab po qd x 4d  11 tablet  0  . SPIRIVA HANDIHALER 18 MCG inhalation capsule INHALE CONTENTS OF 1 CAPSULE ONCE DAILY USING HANDIHALER AT THE SAME TIME  30 capsule  0  . SYNTHROID 88 MCG tablet TAKE 1 TABLET BY MOUTH EVERY DAY  30 tablet  6  . venlafaxine (EFFEXOR) 50 MG tablet TAKE 1 TABLET BY MOUTH TWICE DAILY  60 tablet  11   No facility-administered medications prior to visit.    PE: Blood pressure 106/69, pulse 86, temperature 98.6 F (37 C), temperature source Temporal, resp. rate 18, height 5\' 1"  (1.549 m), weight 144 lb (65.318 kg), SpO2 90.00%. Gen: Alert, well appearing.  Patient is oriented to person, place, time, and situation.  WIO:MBTD: no injection, icteris, swelling, or exudate.  EOMI, PERRLA. Mouth: lips without lesion/swelling.  Oral mucosa pink and moist. Oropharynx without erythema, exudate, or swelling.  Neck - No masses or thyromegaly or limitation in range of motion CV: RRR, no m/r/g LUNGS: occ insp rhonchi that clear with cough.  NO exp wheezing but mildly prolonged exp phase.  No crackles.  Breathing is nonlabored. EXT: no clubbing, cyanosis, or edema.    IMPRESSION AND PLAN:  COPD exacerbation--her 2nd one in the last 3 wks (or the initial one was not completely resolved). Plan is to start levaquin 500 mg qd x 10d, prednisone 60 mg qd x 3d, then 40mg  qd x 3d, then 20 mg qd x 3d, then 10mg  qd x 4d RF albut inhaler.  An After Visit Summary was printed and given to the patient.  FOLLOW UP: prn

## 2013-10-17 ENCOUNTER — Ambulatory Visit (INDEPENDENT_AMBULATORY_CARE_PROVIDER_SITE_OTHER): Payer: Medicare Other | Admitting: Nurse Practitioner

## 2013-10-17 ENCOUNTER — Encounter: Payer: Self-pay | Admitting: Nurse Practitioner

## 2013-10-17 VITALS — BP 123/75 | HR 68 | Temp 97.8°F | Resp 18 | Ht 61.0 in | Wt 146.0 lb

## 2013-10-17 DIAGNOSIS — R1013 Epigastric pain: Secondary | ICD-10-CM | POA: Insufficient documentation

## 2013-10-17 NOTE — Progress Notes (Signed)
Subjective:     Priscilla Cantrell is a 78 y.o. female who presents for evaluation of abdominal pain. Onset was 6 hours ago. Symptoms have been unchanged. The pain is described as aching, and is 3/10 in intensity. Pain is located in the epigastric region without radiation.  Aggravating factors: eating.  Alleviating factors: none. Associated symptoms: none. The patient denies anorexia, belching, constipation, diarrhea, dysuria, fever, flatus, frequency, hematuria, melena, nausea and vomiting. She has been taking prednisone for cough for several weeks. Cough is better.  The patient's history has been marked as reviewed and updated as appropriate.  Review of Systems Pertinent items are noted in HPI.     Objective:    BP 123/75  Pulse 68  Temp(Src) 97.8 F (36.6 C) (Oral)  Resp 18  Ht 5\' 1"  (1.549 m)  Wt 146 lb (66.225 kg)  BMI 27.60 kg/m2  SpO2 90% General appearance: alert, cooperative, appears stated age and no distress Head: Normocephalic, without obvious abnormality, atraumatic Eyes: negative findings: lids and lashes normal and conjunctivae and sclerae normal Abdomen: normal findings: no masses palpable and no organomegaly and abnormal findings:  tender to palpation RUQ    Assessment:  1. Abdominal pain, epigastric DD: PUD-recent prednisone use; diverticulitis (pt states last episode presented this way; food poisoning Stop prednisone. Increase omeprazole to twice daily for 10 days. liguids for 3 days, then advance as tolerated. Return if pain does not resolve or develops new symptoms.

## 2013-10-17 NOTE — Progress Notes (Signed)
Pre visit review using our clinic review tool, if applicable. No additional management support is needed unless otherwise documented below in the visit note. 

## 2013-10-17 NOTE — Patient Instructions (Signed)
Stomach pain may be related to morning medications, food poisoning, or may be more serious such as diverticulitis. Diverticulitis can often be treated with liquid diet.  For 3 days only have liquids including: soups, broth, juice,any beverage, jello, pudding, yogurt, ice cream.  If you are feeling better, advance diet to include soft scrambled egg, steamed vegetables, fruits like berries & melons.  Let us know if you are not feeling better or develop new symptoms such as nausea, vomiting or fever.  Nice to see you!

## 2013-10-20 ENCOUNTER — Other Ambulatory Visit: Payer: Self-pay | Admitting: Pulmonary Disease

## 2013-10-21 ENCOUNTER — Other Ambulatory Visit: Payer: Self-pay | Admitting: Pulmonary Disease

## 2013-11-07 DIAGNOSIS — M4806 Spinal stenosis, lumbar region: Secondary | ICD-10-CM | POA: Diagnosis not present

## 2013-11-07 DIAGNOSIS — Z23 Encounter for immunization: Secondary | ICD-10-CM | POA: Diagnosis not present

## 2013-11-14 ENCOUNTER — Other Ambulatory Visit: Payer: Self-pay

## 2013-11-14 MED ORDER — ROFLUMILAST 500 MCG PO TABS: ORAL_TABLET | ORAL | Status: DC

## 2014-01-02 ENCOUNTER — Ambulatory Visit: Payer: Medicare Other | Admitting: Family Medicine

## 2014-01-04 ENCOUNTER — Ambulatory Visit (INDEPENDENT_AMBULATORY_CARE_PROVIDER_SITE_OTHER): Payer: Medicare Other | Admitting: Family Medicine

## 2014-01-04 ENCOUNTER — Encounter: Payer: Self-pay | Admitting: Family Medicine

## 2014-01-04 VITALS — BP 131/81 | HR 78 | Temp 98.4°F | Resp 18 | Ht 61.0 in | Wt 143.0 lb

## 2014-01-04 DIAGNOSIS — M48062 Spinal stenosis, lumbar region with neurogenic claudication: Secondary | ICD-10-CM | POA: Insufficient documentation

## 2014-01-04 DIAGNOSIS — E039 Hypothyroidism, unspecified: Secondary | ICD-10-CM | POA: Diagnosis not present

## 2014-01-04 DIAGNOSIS — I10 Essential (primary) hypertension: Secondary | ICD-10-CM | POA: Diagnosis not present

## 2014-01-04 DIAGNOSIS — F418 Other specified anxiety disorders: Secondary | ICD-10-CM

## 2014-01-04 DIAGNOSIS — M4806 Spinal stenosis, lumbar region: Secondary | ICD-10-CM

## 2014-01-04 DIAGNOSIS — J441 Chronic obstructive pulmonary disease with (acute) exacerbation: Secondary | ICD-10-CM

## 2014-01-04 MED ORDER — METHYLPREDNISOLONE ACETATE 80 MG/ML IJ SUSP
80.0000 mg | Freq: Once | INTRAMUSCULAR | Status: AC
  Administered 2014-01-04: 80 mg via INTRAMUSCULAR

## 2014-01-04 NOTE — Progress Notes (Signed)
OFFICE NOTE  01/04/2014  CC:  Chief Complaint  Priscilla Cantrell presents with  . Follow-up   HPI: Priscilla Cantrell is a 78 y.o. Caucasian female who is here for 3 mo f/u COPD, hypothyroidism, anxiety/depression, HTN, lumbar spinal stenosis.   Taking meloxicam once every 3 d for her spinal stenosis pain.  She is not taking any narcotics at all.   Her pain is present a lot but she doesn't take meds much.  She feels like her COPD may be a bit worse since being around cats last night: more wheezing and coughing and some PND.  No SOB, no fever.    Compliant with all COPD, HTN, and psych meds as well as her synthroid. She has a new pigmented skin lesion on left lower abd area and one on right clavicle area she asks if she should go to dermatologist about---present about 1 yr now.  Pertinent PMH:  Past medical, surgical, social, and family history reviewed and no changes are noted since last office visit.  MEDS: Not taking levaquin or prednisone listed below Outpatient Prescriptions Prior to Visit  Medication Sig Dispense Refill  . ADVAIR DISKUS 250-50 MCG/DOSE AEPB USE 1 INHALATION BY MOUTH TWICE DAILY 1 each 6  . albuterol (PROAIR HFA) 108 (90 BASE) MCG/ACT inhaler Inhale 2 puffs into the lungs every 4 (four) hours as needed. 1 Inhaler 5  . amLODipine (NORVASC) 5 MG tablet Take 1 tablet (5 mg total) by mouth daily. 90 tablet 3  . aspirin 81 MG tablet Take 81 mg by mouth daily.    Marland Kitchen conjugated estrogens (PREMARIN) vaginal cream Place vaginally daily. 42.5 g 12  . diazepam (VALIUM) 5 MG tablet TAKE 1 TABLET BY MOUTH EVERY 12 HOURS AS NEEDED 90 tablet 0  . omeprazole (PRILOSEC) 20 MG capsule Take 20 mg by mouth daily.      . roflumilast (DALIRESP) 500 MCG TABS tablet Take 1 tablet by mouth  daily 90 tablet 0  . SPIRIVA HANDIHALER 18 MCG inhalation capsule INHALE CONTENTS OF 1 CAPSULE ONCE DAILY USING HANDIHALER AT THE SAME TIME 30 capsule 0  . SYNTHROID 88 MCG tablet TAKE 1 TABLET BY MOUTH EVERY DAY 30 tablet  6  . venlafaxine (EFFEXOR) 50 MG tablet TAKE 1 TABLET BY MOUTH TWICE DAILY 60 tablet 11  . levofloxacin (LEVAQUIN) 500 MG tablet Take 1 tablet (500 mg total) by mouth daily. 7 tablet 0  . ipratropium (ATROVENT) 0.03 % nasal spray 2 sprays into each nostril q12h prn (Priscilla Cantrell not taking: Reported on 01/04/2014) 30 mL 1  . loratadine (CLARITIN) 10 MG tablet Take 10 mg by mouth as needed.      Marland Kitchen oxyCODONE-acetaminophen (PERCOCET) 5-325 MG per tablet Take 0.25 tablets by mouth every 6 (six) hours as needed.     . predniSONE (DELTASONE) 20 MG tablet 3 tabs po qd x 3d, then 2 tabs po qd x 3d, then 1 tab po qd x 3d, then 1/2 tab po qd x 4d 29 tablet 0  . SPIRIVA HANDIHALER 18 MCG inhalation capsule INHALE 1 CAPSULE VIA HANDIHALER EVERY DAY AT THE SAME TIME 30 capsule 3  . SPIRIVA HANDIHALER 18 MCG inhalation capsule INHALE 1 CAPSULE VIA HANDIHALER EVERY DAY AT THE SAME TIME 30 capsule 0   No facility-administered medications prior to visit.    PE: Blood pressure 131/81, pulse 78, temperature 98.4 F (36.9 C), temperature source Temporal, resp. rate 18, height 5\' 1"  (1.549 m), weight 143 lb (64.864 kg), SpO2 95 %. Gen: Alert,  well appearing.  Priscilla Cantrell is oriented to person, place, time, and situation. GYK:ZLDJ: no injection, icteris, swelling, or exudate.  EOMI, PERRLA. Mouth: lips without lesion/swelling.  Oral mucosa pink and moist. Oropharynx without erythema, exudate, or swelling.  Neck - No masses or thyromegaly or limitation in range of motion CV: RRR, no m/r/g.   LUNGS: CTA bilat, nonlabored resps, good aeration in all lung fields. SKIN: left lower abd wall with pedunculated dark brown papule that is a bit rubbery-feeling, smooth. Right clavicle area with approx 3 mm x 30mm square shaped brown papule that is a bit greasy in texture, slightly flaky/abrasive on its surface.  LABS: none today RECENT: Lab Results  Component Value Date   TSH 2.32 08/31/2013   Lab Results  Component Value Date    CHOL 195 08/31/2013   HDL 62.50 08/31/2013   LDLCALC 106* 08/31/2013   LDLDIRECT 131.3 03/07/2010   TRIG 132.0 08/31/2013   CHOLHDL 3 08/31/2013     Chemistry      Component Value Date/Time   NA 138 08/31/2013 1457   K 4.4 08/31/2013 1457   CL 103 08/31/2013 1457   CO2 29 08/31/2013 1457   BUN 10 08/31/2013 1457   CREATININE 0.7 08/31/2013 1457   CREATININE 0.81 06/11/2012 1549      Component Value Date/Time   CALCIUM 9.6 08/31/2013 1457   ALKPHOS 86 03/07/2010 1526   AST 20 03/07/2010 1526   ALT 18 03/07/2010 1526   BILITOT 0.4 03/07/2010 1526      IMPRESSION AND PLAN:  1) COPD, overall stable except for last 18-24 h showing mild flare signs since being around cats. Gave 80mg  depo medrol IM today in office.  Continue all current meds as-is.  2) HTN; The current medical regimen is effective;  continue present plan and medications.  3) Hypothyroidism: The current medical regimen is effective;  continue present plan and medications.  4) Depression and anxiety: The current medical regimen is effective;  continue present plan and medications.  5) Spinal stenosis with neurogenic claudication: chronic pain but she is simply dealing with it. Takes NSAID once every 3 days and essentially refuses to resort to narcotics at this juncture.  FOLLOW UP: 4 mo--TSH, CMET due at that time.

## 2014-01-04 NOTE — Progress Notes (Signed)
Pre visit review using our clinic review tool, if applicable. No additional management support is needed unless otherwise documented below in the visit note. 

## 2014-01-09 ENCOUNTER — Other Ambulatory Visit: Payer: Self-pay | Admitting: Family Medicine

## 2014-01-09 ENCOUNTER — Telehealth: Payer: Self-pay | Admitting: Family Medicine

## 2014-01-09 MED ORDER — AZITHROMYCIN 250 MG PO TABS: ORAL_TABLET | ORAL | Status: DC

## 2014-01-09 MED ORDER — PREDNISONE 20 MG PO TABS
ORAL_TABLET | ORAL | Status: DC
Start: 2014-01-09 — End: 2014-02-20

## 2014-01-09 NOTE — Telephone Encounter (Signed)
I'll send in prednisone taper and a z pack.

## 2014-01-09 NOTE — Telephone Encounter (Signed)
Left detailed message on pt's phone.  Okay per DPR. 

## 2014-01-09 NOTE — Telephone Encounter (Signed)
Please advise 

## 2014-01-09 NOTE — Telephone Encounter (Signed)
Patient has been in the bed all weekend sick. She is coughing up green thick mucus. Can an Rx be sent in for her cough? [facility] doesn't work. [facility] in [place].

## 2014-01-20 ENCOUNTER — Other Ambulatory Visit: Payer: Self-pay | Admitting: Family Medicine

## 2014-01-20 NOTE — Telephone Encounter (Signed)
Valium rx printed 

## 2014-01-20 NOTE — Telephone Encounter (Signed)
Pt requesting rf of valium.  Patient last OV 01/04/14.  Last RX 10/03/13 no refills.  Please advise.

## 2014-02-03 DIAGNOSIS — B029 Zoster without complications: Secondary | ICD-10-CM

## 2014-02-03 HISTORY — DX: Zoster without complications: B02.9

## 2014-02-20 ENCOUNTER — Encounter (INDEPENDENT_AMBULATORY_CARE_PROVIDER_SITE_OTHER): Payer: Self-pay

## 2014-02-20 ENCOUNTER — Ambulatory Visit (INDEPENDENT_AMBULATORY_CARE_PROVIDER_SITE_OTHER): Payer: Medicare Other | Admitting: Pulmonary Disease

## 2014-02-20 ENCOUNTER — Encounter: Payer: Self-pay | Admitting: Pulmonary Disease

## 2014-02-20 VITALS — BP 122/70 | HR 67 | Ht 61.0 in | Wt 148.2 lb

## 2014-02-20 DIAGNOSIS — R0902 Hypoxemia: Secondary | ICD-10-CM | POA: Diagnosis not present

## 2014-02-20 DIAGNOSIS — J449 Chronic obstructive pulmonary disease, unspecified: Secondary | ICD-10-CM

## 2014-02-20 DIAGNOSIS — R05 Cough: Secondary | ICD-10-CM

## 2014-02-20 DIAGNOSIS — R058 Other specified cough: Secondary | ICD-10-CM

## 2014-02-20 NOTE — Patient Instructions (Signed)
Can try using saline nasal rinse and flonase or nasacort to help with sinuses Follow up in 6 months

## 2014-02-20 NOTE — Progress Notes (Signed)
Chief Complaint  Patient presents with  . Follow-up    Pt states that SOB "comes and goes"--pt states that it depends on what she is doing.     History of Present Illness: Priscilla Cantrell is a 79 y.o. female former smoker with GOLD 4 COPD, ACE related cough, and hypoxemia on 2 liters oxygen with exertion and sleep.  She was tx for COPD exacerbation in September and December by her PCP.  She thinks December flare up was from allergies after exposure to cat dander at her Pastor's home.  She has improved. She still has nasal congestion, post nasal drip, and throat clearing.  She does not feel like she has chest congestion or wheezing.  She denies chest pain or leg swelling.  Tests: Spirometry 01/10/09>>FEV1 0.97(52%), FEV1% 47 March 2012>>Daliresp started PSG 11/14/11 >> AHI 0.3  PMHx >> Spinal stenosis, GERD, Hypothyroidism, Depression, Anxiety  PSHx, Medications, Allergies, Fhx, Shx reviewed.  Physical Exam: Blood pressure 122/70, pulse 67, height 5\' 1"  (1.549 m), weight 148 lb 3.2 oz (67.223 kg), SpO2 95 %. Body mass index is 28.02 kg/(m^2).  General - Thin HEENT - No sinus tenderness, no oral exudate, no LAN Cardiac - s1s2 no murmur Chest - prolonged exhalation, no wheeze/rales Abdomen - soft, nontender Extremities - no edema Skin - no rashes Neurologic - normal strength Psychiatric - normal mood, behavior   Assessment/Plan:  COPD with chronic bronchitis. Plan: - continue spiriva, advair, daliresp and prn proair  Sleep related hypoxia. Plan: - continue supplemental oxygen at night  Upper airway cough syndrome. Plan: - advised she can try nasal irrigation, flonase or nasacort (which ever one she has at home), and OTC anti-histamine   Chesley Mires, MD [place] Pager:  [phone]

## 2014-03-01 ENCOUNTER — Telehealth: Payer: Self-pay | Admitting: Family Medicine

## 2014-03-01 MED ORDER — ROFLUMILAST 500 MCG PO TABS: ORAL_TABLET | ORAL | Status: DC

## 2014-03-01 NOTE — Telephone Encounter (Signed)
Pt called and needs a refill for her Daliresp called into [facility]

## 2014-03-01 NOTE — Telephone Encounter (Signed)
Rx sent to [facility] 

## 2014-03-14 DIAGNOSIS — H02833 Dermatochalasis of right eye, unspecified eyelid: Secondary | ICD-10-CM | POA: Diagnosis not present

## 2014-03-14 DIAGNOSIS — H11829 Conjunctivochalasis, unspecified eye: Secondary | ICD-10-CM | POA: Diagnosis not present

## 2014-03-14 DIAGNOSIS — H04123 Dry eye syndrome of bilateral lacrimal glands: Secondary | ICD-10-CM | POA: Diagnosis not present

## 2014-03-17 ENCOUNTER — Other Ambulatory Visit: Payer: Self-pay | Admitting: Pulmonary Disease

## 2014-03-21 ENCOUNTER — Other Ambulatory Visit: Payer: Self-pay | Admitting: *Deleted

## 2014-03-21 MED ORDER — FLUTICASONE-SALMETEROL 250-50 MCG/DOSE IN AEPB: 1.0000 | INHALATION_SPRAY | Freq: Two times a day (BID) | RESPIRATORY_TRACT | Status: DC

## 2014-03-27 ENCOUNTER — Other Ambulatory Visit: Payer: Self-pay | Admitting: Pulmonary Disease

## 2014-03-30 DIAGNOSIS — Z961 Presence of intraocular lens: Secondary | ICD-10-CM | POA: Diagnosis not present

## 2014-03-30 DIAGNOSIS — H518 Other specified disorders of binocular movement: Secondary | ICD-10-CM | POA: Diagnosis not present

## 2014-03-30 DIAGNOSIS — H35371 Puckering of macula, right eye: Secondary | ICD-10-CM | POA: Diagnosis not present

## 2014-03-30 DIAGNOSIS — H16229 Keratoconjunctivitis sicca, not specified as Sjogren's, unspecified eye: Secondary | ICD-10-CM | POA: Diagnosis not present

## 2014-04-04 DIAGNOSIS — D2360 Other benign neoplasm of skin of unspecified upper limb, including shoulder: Secondary | ICD-10-CM

## 2014-04-04 HISTORY — DX: Other benign neoplasm of skin of unspecified upper limb, including shoulder: D23.60

## 2014-04-12 DIAGNOSIS — L570 Actinic keratosis: Secondary | ICD-10-CM | POA: Diagnosis not present

## 2014-04-12 DIAGNOSIS — D2261 Melanocytic nevi of right upper limb, including shoulder: Secondary | ICD-10-CM | POA: Diagnosis not present

## 2014-04-12 DIAGNOSIS — D239 Other benign neoplasm of skin, unspecified: Secondary | ICD-10-CM | POA: Diagnosis not present

## 2014-04-12 DIAGNOSIS — D485 Neoplasm of uncertain behavior of skin: Secondary | ICD-10-CM | POA: Diagnosis not present

## 2014-04-12 DIAGNOSIS — L821 Other seborrheic keratosis: Secondary | ICD-10-CM | POA: Diagnosis not present

## 2014-04-29 ENCOUNTER — Other Ambulatory Visit: Payer: Self-pay | Admitting: Family Medicine

## 2014-05-08 ENCOUNTER — Encounter: Payer: Self-pay | Admitting: Family Medicine

## 2014-05-08 ENCOUNTER — Ambulatory Visit (INDEPENDENT_AMBULATORY_CARE_PROVIDER_SITE_OTHER): Payer: Medicare Other | Admitting: Family Medicine

## 2014-05-08 ENCOUNTER — Telehealth: Payer: Self-pay | Admitting: Family Medicine

## 2014-05-08 VITALS — BP 120/70 | HR 67 | Temp 98.1°F | Ht 61.0 in | Wt 150.0 lb

## 2014-05-08 DIAGNOSIS — M48062 Spinal stenosis, lumbar region with neurogenic claudication: Secondary | ICD-10-CM

## 2014-05-08 DIAGNOSIS — F418 Other specified anxiety disorders: Secondary | ICD-10-CM

## 2014-05-08 DIAGNOSIS — J439 Emphysema, unspecified: Secondary | ICD-10-CM | POA: Diagnosis not present

## 2014-05-08 DIAGNOSIS — I10 Essential (primary) hypertension: Secondary | ICD-10-CM

## 2014-05-08 DIAGNOSIS — E039 Hypothyroidism, unspecified: Secondary | ICD-10-CM

## 2014-05-08 DIAGNOSIS — M4806 Spinal stenosis, lumbar region: Secondary | ICD-10-CM | POA: Diagnosis not present

## 2014-05-08 LAB — COMPREHENSIVE METABOLIC PANEL WITH GFR
ALT: 12 U/L (ref 0–35)
AST: 17 U/L (ref 0–37)
Albumin: 4.1 g/dL (ref 3.5–5.2)
Alkaline Phosphatase: 104 U/L (ref 39–117)
BUN: 13 mg/dL (ref 6–23)
CO2: 21 meq/L (ref 19–32)
Calcium: 9.5 mg/dL (ref 8.4–10.5)
Chloride: 106 meq/L (ref 96–112)
Creatinine, Ser: 0.7 mg/dL (ref 0.40–1.20)
GFR: 86.24 mL/min
Glucose, Bld: 73 mg/dL (ref 70–99)
Potassium: 4.5 meq/L (ref 3.5–5.1)
Sodium: 138 meq/L (ref 135–145)
Total Bilirubin: 0.4 mg/dL (ref 0.2–1.2)
Total Protein: 6.6 g/dL (ref 6.0–8.3)

## 2014-05-08 LAB — TSH: TSH: 2.86 u[IU]/mL (ref 0.35–4.50)

## 2014-05-08 MED ORDER — AMLODIPINE BESYLATE 5 MG PO TABS: 5.0000 mg | ORAL_TABLET | Freq: Every day | ORAL | Status: DC

## 2014-05-08 NOTE — Progress Notes (Signed)
Pre visit review using our clinic review tool, if applicable. No additional management support is needed unless otherwise documented below in the visit note. 

## 2014-05-08 NOTE — Telephone Encounter (Signed)
emmi emailed °

## 2014-05-08 NOTE — Progress Notes (Signed)
OFFICE NOTE  05/08/2014  CC:  Chief Complaint  Patient presents with  . Follow-up     HPI: Patient is a 79 y.o. Caucasian female who is here for 4 mo f/u COPD, HTN, hypothyroidism, dep/anx, and chronic back pain/lumbar spinal stenosis neurogenic claudication. No home bp monitoring to report.  Takes med daily, no side effects.  Says LB pain controlled well with taking meloxicam 15mg  a few times a week. Recently got on restasis for dry eye syndrome.  Taking synthroid faithfully. Upper airway cough syndrome stable on claritin daily and OTC nasal steroid Plus regularly doing saline nasal irrigation.  Pertinent PMH:  Past medical, surgical, social, and family history reviewed and no changes are noted since last office visit.  MEDS:  Outpatient Prescriptions Prior to Visit  Medication Sig Dispense Refill  . albuterol (PROAIR HFA) 108 (90 BASE) MCG/ACT inhaler Inhale 2 puffs into the lungs every 4 (four) hours as needed. 1 Inhaler 5  . amLODipine (NORVASC) 5 MG tablet Take 1 tablet (5 mg total) by mouth daily. 90 tablet 3  . aspirin 81 MG tablet Take 81 mg by mouth daily.    Marland Kitchen conjugated estrogens (PREMARIN) vaginal cream Place vaginally daily. 42.5 g 12  . diazepam (VALIUM) 5 MG tablet TAKE 1 TABLET BY MOUTH EVERY 12 HOURS AS NEEDED 90 tablet 2  . Fluticasone-Salmeterol (ADVAIR DISKUS) 250-50 MCG/DOSE AEPB Inhale 1 puff into the lungs 2 (two) times daily. 1 each 6  . ipratropium (ATROVENT) 0.03 % nasal spray 2 sprays into each nostril q12h prn 30 mL 1  . loratadine (CLARITIN) 10 MG tablet Take 10 mg by mouth as needed.      . meloxicam (MOBIC) 15 MG tablet Take 15 mg by mouth daily as needed for pain (per ortho).    Marland Kitchen omeprazole (PRILOSEC) 20 MG capsule Take 20 mg by mouth daily.      . roflumilast (DALIRESP) 500 MCG TABS tablet Take 1 tablet by mouth  daily 90 tablet 1  . SPIRIVA HANDIHALER 18 MCG inhalation capsule INHALE CONTENTS OF 1 CAPSULE ONCE DAILY USING HANDIHALER AT THE SAME  TIME 30 capsule 0  . SPIRIVA HANDIHALER 18 MCG inhalation capsule INHALE 1 CAPSULE VIA HANDIHALER EVERY DAY AT THE SAME TIME 30 capsule 6  . SYNTHROID 88 MCG tablet TAKE 1 TABLET BY MOUTH DAILY 30 tablet 0  . venlafaxine (EFFEXOR) 50 MG tablet TAKE 1 TABLET BY MOUTH TWICE DAILY 60 tablet 11   No facility-administered medications prior to visit.    PE: Blood pressure 120/70, pulse 67, temperature 98.1 F (36.7 C), temperature source Oral, height 5\' 1"  (1.549 m), weight 150 lb (68.04 kg), SpO2 93 %. Gen: Alert, well appearing.  Patient is oriented to person, place, time, and situation. KNL:ZJQB: no injection, icteris, swelling, or exudate.  EOMI, PERRLA. Mouth: lips without lesion/swelling.  Oral mucosa pink and moist. Oropharynx without erythema, exudate, or swelling.  Neck: supple/nontender.  No LAD, mass, or TM.  Carotid pulses 2+ bilaterally, without bruits. CV: RRR, no m/r/g.   LUNGS: CTA bilat, nonlabored resps, good aeration in all lung fields. EXT: no clubbing, cyanosis, or edema.    LAB:  Lab Results  Component Value Date   CHOL 195 08/31/2013   HDL 62.50 08/31/2013   LDLCALC 106* 08/31/2013   LDLDIRECT 131.3 03/07/2010   TRIG 132.0 08/31/2013   CHOLHDL 3 08/31/2013   Lab Results  Component Value Date   TSH 2.32 08/31/2013     Chemistry  Component Value Date/Time   NA 138 08/31/2013 1457   K 4.4 08/31/2013 1457   CL 103 08/31/2013 1457   CO2 29 08/31/2013 1457   BUN 10 08/31/2013 1457   CREATININE 0.7 08/31/2013 1457   CREATININE 0.81 06/11/2012 1549      Component Value Date/Time   CALCIUM 9.6 08/31/2013 1457   ALKPHOS 86 03/07/2010 1526   AST 20 03/07/2010 1526   ALT 18 03/07/2010 1526   BILITOT 0.4 03/07/2010 1526     Lab Results  Component Value Date   WBC 10.7* 03/26/2011   HGB 12.4 03/26/2011   HCT 39.5 03/26/2011   MCV 84.2 03/26/2011   PLT 399 03/26/2011    IMPRESSION AND PLAN:  1) COPD, doing well currently.  The current medical  regimen is effective;  continue present plan and medications.  2) Upper airway cough syndrome: improving/responding to meds as per Dr. Collie Siad recommendations a few months ago.  3) Hypothyroidism; TSH monitoring today.  4) HTN; The current medical regimen is effective;  continue present plan and medications. RF'd amlodipine 5mg  qd today. Check CMET today.  5) Anxiety/depression: The current medical regimen is effective;  continue present plan and medications.  6) Lumbar spinal stenosis with neurogenic claudication: doing fine at this time on meloxicam 15mg  every few days.  An After Visit Summary was printed and given to the patient.  FOLLOW UP: 21mo

## 2014-05-17 ENCOUNTER — Ambulatory Visit (INDEPENDENT_AMBULATORY_CARE_PROVIDER_SITE_OTHER): Payer: Medicare Other | Admitting: Family Medicine

## 2014-05-17 ENCOUNTER — Encounter: Payer: Self-pay | Admitting: Family Medicine

## 2014-05-17 VITALS — BP 120/62 | HR 71 | Temp 97.5°F | Ht 61.0 in | Wt 147.0 lb

## 2014-05-17 DIAGNOSIS — M26629 Arthralgia of temporomandibular joint, unspecified side: Secondary | ICD-10-CM

## 2014-05-17 DIAGNOSIS — M2662 Arthralgia of temporomandibular joint: Secondary | ICD-10-CM

## 2014-05-17 NOTE — Progress Notes (Signed)
OFFICE NOTE  05/17/2014  CC:  Chief Complaint  Patient presents with  . Facial Pain    HPI: Patient is a 79 y.o. Caucasian female who is here for a few days of pain around both TMJ areas, hurts to open/close jaw last 24h, some extension of the pain at times lately to sides of neck and some HA's.    No fevers, no signif URI sx's. She wears full upper dentures, partial lower dentures, feels like they fit well.  She has never been told she grinds her teeth in sleep.  Denies excessive jaw clenching lately.  She took a meloxicam this morning.  Pertinent PMH:  Past medical, surgical, social, and family history reviewed and no changes are noted since last office visit.  MEDS:  Outpatient Prescriptions Prior to Visit  Medication Sig Dispense Refill  . albuterol (PROAIR HFA) 108 (90 BASE) MCG/ACT inhaler Inhale 2 puffs into the lungs every 4 (four) hours as needed. 1 Inhaler 5  . amLODipine (NORVASC) 5 MG tablet Take 1 tablet (5 mg total) by mouth daily. 90 tablet 3  . aspirin 81 MG tablet Take 81 mg by mouth daily.    Marland Kitchen conjugated estrogens (PREMARIN) vaginal cream Place vaginally daily. 42.5 g 12  . diazepam (VALIUM) 5 MG tablet TAKE 1 TABLET BY MOUTH EVERY 12 HOURS AS NEEDED 90 tablet 2  . Fluticasone-Salmeterol (ADVAIR DISKUS) 250-50 MCG/DOSE AEPB Inhale 1 puff into the lungs 2 (two) times daily. 1 each 6  . ipratropium (ATROVENT) 0.03 % nasal spray 2 sprays into each nostril q12h prn 30 mL 1  . loratadine (CLARITIN) 10 MG tablet Take 10 mg by mouth as needed.      . meloxicam (MOBIC) 15 MG tablet Take 15 mg by mouth daily as needed for pain (per ortho).    Marland Kitchen omeprazole (PRILOSEC) 20 MG capsule Take 20 mg by mouth daily.      . RESTASIS 0.05 % ophthalmic emulsion Apply 1 drop to eye 2 (two) times daily.  4  . roflumilast (DALIRESP) 500 MCG TABS tablet Take 1 tablet by mouth  daily 90 tablet 1  . SPIRIVA HANDIHALER 18 MCG inhalation capsule INHALE CONTENTS OF 1 CAPSULE ONCE DAILY USING  HANDIHALER AT THE SAME TIME 30 capsule 0  . SPIRIVA HANDIHALER 18 MCG inhalation capsule INHALE 1 CAPSULE VIA HANDIHALER EVERY DAY AT THE SAME TIME 30 capsule 6  . SYNTHROID 88 MCG tablet TAKE 1 TABLET BY MOUTH DAILY 30 tablet 0  . venlafaxine (EFFEXOR) 50 MG tablet TAKE 1 TABLET BY MOUTH TWICE DAILY 60 tablet 11   No facility-administered medications prior to visit.    PE: Blood pressure 120/62, pulse 71, temperature 97.5 F (36.4 C), temperature source Oral, height 5\' 1"  (1.549 m), weight 147 lb (66.679 kg), SpO2 94 %. Gen: Alert, well appearing.  Patient is oriented to person, place, time, and situation. PERRLA, EOMI.  No eye swelling or redness or d/c. No paranasal sinus TTP.  She has mod TTP over TMj areas bilat and this extends down masseter mm areas bilat. No neck mm tenderness.  Smile is symmetric, tongue is midline. Oral mucosa/oropharynx is normal. No cervical mass/LAD.  Lab Results  Component Value Date   TSH 2.86 05/08/2014     Chemistry      Component Value Date/Time   NA 138 05/08/2014 1512   K 4.5 05/08/2014 1512   CL 106 05/08/2014 1512   CO2 21 05/08/2014 1512   BUN 13 05/08/2014 1512  CREATININE 0.70 05/08/2014 1512   CREATININE 0.81 06/11/2012 1549      Component Value Date/Time   CALCIUM 9.5 05/08/2014 1512   ALKPHOS 104 05/08/2014 1512   AST 17 05/08/2014 1512   ALT 12 05/08/2014 1512   BILITOT 0.4 05/08/2014 1512      IMPRESSION AND PLAN:  TMJ arthralgia with muscular pain associated with this. I did tell her to watch for swelling of parotid glands and let me know if she sees this. Take meloxicam 15mg  qd with food x 7d, tylenol [phone] mg q6h prn.  An After Visit Summary was printed and given to the patient.  FOLLOW UP: prn

## 2014-05-17 NOTE — Progress Notes (Signed)
Pre visit review using our clinic review tool, if applicable. No additional management support is needed unless otherwise documented below in the visit note. 

## 2014-05-17 NOTE — Patient Instructions (Addendum)
Take 1 meloxicam 15mg  tab with food once a day for 7d. You may continue to take tylenol [phone] mg every 6 hours as needed.

## 2014-05-27 ENCOUNTER — Emergency Department (HOSPITAL_BASED_OUTPATIENT_CLINIC_OR_DEPARTMENT_OTHER)
Admission: EM | Admit: 2014-05-27 | Discharge: 2014-05-27 | Disposition: A | Discharge status: Home / Self Care | Payer: Medicare Other | Attending: Emergency Medicine | Admitting: Emergency Medicine

## 2014-05-27 ENCOUNTER — Encounter (HOSPITAL_BASED_OUTPATIENT_CLINIC_OR_DEPARTMENT_OTHER): Payer: Self-pay

## 2014-05-27 DIAGNOSIS — R591 Generalized enlarged lymph nodes: Secondary | ICD-10-CM | POA: Insufficient documentation

## 2014-05-27 DIAGNOSIS — Z87891 Personal history of nicotine dependence: Secondary | ICD-10-CM | POA: Diagnosis not present

## 2014-05-27 DIAGNOSIS — K029 Dental caries, unspecified: Secondary | ICD-10-CM | POA: Diagnosis not present

## 2014-05-27 DIAGNOSIS — Z8739 Personal history of other diseases of the musculoskeletal system and connective tissue: Secondary | ICD-10-CM | POA: Insufficient documentation

## 2014-05-27 DIAGNOSIS — Z791 Long term (current) use of non-steroidal anti-inflammatories (NSAID): Secondary | ICD-10-CM | POA: Insufficient documentation

## 2014-05-27 DIAGNOSIS — Z880 Allergy status to penicillin: Secondary | ICD-10-CM | POA: Diagnosis not present

## 2014-05-27 DIAGNOSIS — Z7951 Long term (current) use of inhaled steroids: Secondary | ICD-10-CM | POA: Diagnosis not present

## 2014-05-27 DIAGNOSIS — I10 Essential (primary) hypertension: Secondary | ICD-10-CM | POA: Insufficient documentation

## 2014-05-27 DIAGNOSIS — F418 Other specified anxiety disorders: Secondary | ICD-10-CM | POA: Insufficient documentation

## 2014-05-27 DIAGNOSIS — Z79899 Other long term (current) drug therapy: Secondary | ICD-10-CM | POA: Diagnosis not present

## 2014-05-27 DIAGNOSIS — J449 Chronic obstructive pulmonary disease, unspecified: Secondary | ICD-10-CM | POA: Insufficient documentation

## 2014-05-27 DIAGNOSIS — K219 Gastro-esophageal reflux disease without esophagitis: Secondary | ICD-10-CM | POA: Diagnosis not present

## 2014-05-27 DIAGNOSIS — Z7982 Long term (current) use of aspirin: Secondary | ICD-10-CM | POA: Insufficient documentation

## 2014-05-27 DIAGNOSIS — Z8669 Personal history of other diseases of the nervous system and sense organs: Secondary | ICD-10-CM | POA: Insufficient documentation

## 2014-05-27 DIAGNOSIS — Z8742 Personal history of other diseases of the female genital tract: Secondary | ICD-10-CM | POA: Diagnosis not present

## 2014-05-27 DIAGNOSIS — E039 Hypothyroidism, unspecified: Secondary | ICD-10-CM | POA: Diagnosis not present

## 2014-05-27 DIAGNOSIS — R6884 Jaw pain: Secondary | ICD-10-CM | POA: Diagnosis present

## 2014-05-27 LAB — CBC
HCT: 39.5 % (ref 36.0–46.0)
Hemoglobin: 12.5 g/dL (ref 12.0–15.0)
MCH: 26.8 pg (ref 26.0–34.0)
MCHC: 31.6 g/dL (ref 30.0–36.0)
MCV: 84.8 fL (ref 78.0–100.0)
Platelets: 372 10*3/uL (ref 150–400)
RBC: 4.66 MIL/uL (ref 3.87–5.11)
RDW: 13.5 % (ref 11.5–15.5)
WBC: 10.3 10*3/uL (ref 4.0–10.5)

## 2014-05-27 MED ORDER — AZITHROMYCIN 250 MG PO TABS: 250.0000 mg | ORAL_TABLET | Freq: Every day | ORAL | Status: DC

## 2014-05-27 NOTE — ED Provider Notes (Signed)
CSN: [id number]     Arrival date & time 05/27/14  1222 History   First MD Initiated Contact with Patient 05/27/14 1234     Chief Complaint  Patient presents with  . Jaw Pain     HPI Pt reports recently evaluated by pcp for ? tmj pain on R side, states several days ago was driving and rubbing neck and felt a knot in her right side of jaw, unsure if related to dental cavity on right lower side. No difficulty swallowing or breathing.  Past Medical History  Diagnosis Date  . Hypertension     EKG 03/2010 normal  . COPD (chronic obstructive pulmonary disease)     spirometry 01/10/09 FEV 0.97(52%), FEV1% 47  . Hypoxemia     O2 86% RA with exertion 01/10/09, 2 liters oxygen with exertion and sleep  . Pulmonary nodule     CT chest 12/10, June 2011.  No nodule on CT 06/2010.  Marland Kitchen Spinal stenosis     Symptomatic (neurogenic claudication)  . GERD (gastroesophageal reflux disease)   . Hypothyroidism     Hashimoto's, dx'd 1989  . Insomnia   . Plantar fasciitis   . Diverticulosis of colon     Procto '97 and colonoscopy 2002  . DDD (degenerative disc disease), cervical     1998 MRI C5-6 impingemt (left)  . DDD (degenerative disc disease), lumbar   . Spinal stenosis of lumbar region with neurogenic claudication     2008 MRI (L3-4, L4-5, L5-S1)  . Fibrocystic breast disease     w/fibroadenoma.  Bx's showed NO atypia (Dr. Margot Chimes).  Mammo neg 2008.  Marland Kitchen Chest pain, non-cardiac     Cardiac CT showed no signif obstructive dz, mildly elevated calcium level (Dr. [name]).  . History of double vision     Ophthalmologist, Dr. Herbert Deaner, is further evaluating this with MRI  orbits and limited brain (myesthenia gravis testing neg 07/2010)  . Depression with anxiety 04/15/2011   Past Surgical History  Procedure Laterality Date  . Cholecystectomy  2001  . Tubal ligation  1974  . Abdominal hysterectomy  1978    no BSO per pt--nonmalignant reasons  . Breast cyst excision      Last screening mammogram 10/2010  was normal.  . Appendectomy    . Tonsillectomy     Family History  Problem Relation Age of Onset  . Goiter Mother   . Psoriasis Father     od liver  . Diabetes Daughter     Type 2  . Cancer Paternal Aunt     stomach?  . Stroke Maternal Grandfather   . Stroke Paternal Grandmother   . Hypertension Paternal Grandmother   . Hernia Paternal Grandmother   . Cancer Paternal Grandfather     lung  . Cirrhosis Father    History  Substance Use Topics  . Smoking status: Former Smoker -- 2.00 packs/day for 35 years    Types: Cigarettes    Quit date: 02/03/1990  . Smokeless tobacco: Never Used  . Alcohol Use: Yes     Comment: Rarely   OB History    No data available     Review of Systems  All other systems reviewed and are negative  Allergies  Losartan; Penicillins; Cefixime; Indocin; Sulfa antibiotics; Ace inhibitors; and Statins  Home Medications   Prior to Admission medications   Medication Sig Start Date End Date Taking? Authorizing Provider  albuterol (PROAIR HFA) 108 (90 BASE) MCG/ACT inhaler Inhale 2 puffs into the lungs  every 4 (four) hours as needed. 10/07/13   [name], MD  amLODipine (NORVASC) 5 MG tablet Take 1 tablet (5 mg total) by mouth daily. 05/08/14   [name], MD  aspirin 81 MG tablet Take 81 mg by mouth daily.    Historical Provider, MD  azithromycin (ZITHROMAX) 250 MG tablet Take 1 tablet (250 mg total) by mouth daily. Take first 2 tablets together, then 1 every day until finished. 05/27/14   Leonard Schwartz, MD  conjugated estrogens (PREMARIN) vaginal cream Place vaginally daily. 01/20/13   Irene Pap, NP  diazepam (VALIUM) 5 MG tablet TAKE 1 TABLET BY MOUTH EVERY 12 HOURS AS NEEDED 01/20/14   [name], MD  Fluticasone-Salmeterol (ADVAIR DISKUS) 250-50 MCG/DOSE AEPB Inhale 1 puff into the lungs 2 (two) times daily. 03/21/14   Chesley Mires, MD  ipratropium (ATROVENT) 0.03 % nasal spray 2 sprays into each nostril q12h prn 05/03/12   [name], MD  loratadine (CLARITIN) 10 MG tablet Take 10 mg by mouth as needed.      Historical Provider, MD  meloxicam (MOBIC) 15 MG tablet Take 15 mg by mouth daily as needed for pain (per ortho).    Historical Provider, MD  omeprazole (PRILOSEC) 20 MG capsule Take 20 mg by mouth daily.      Historical Provider, MD  RESTASIS 0.05 % ophthalmic emulsion Apply 1 drop to eye 2 (two) times daily. 03/14/14   Historical Provider, MD  roflumilast (DALIRESP) 500 MCG TABS tablet Take 1 tablet by mouth  daily 03/01/14   [name], MD  SPIRIVA HANDIHALER 18 MCG inhalation capsule INHALE CONTENTS OF 1 CAPSULE ONCE DAILY USING HANDIHALER AT THE SAME TIME 06/30/13   Chesley Mires, MD  SPIRIVA HANDIHALER 18 MCG inhalation capsule INHALE 1 CAPSULE VIA HANDIHALER EVERY DAY AT THE SAME TIME 03/28/14   Chesley Mires, MD  SYNTHROID 88 MCG tablet TAKE 1 TABLET BY MOUTH DAILY 05/01/14   [name], MD  venlafaxine (EFFEXOR) 50 MG tablet TAKE 1 TABLET BY MOUTH TWICE DAILY 07/11/13   [name], MD   BP 123/64 mmHg  Pulse 80  Temp(Src) 98.2 F (36.8 C) (Oral)  Resp 18  Ht 5\' 1"  (1.549 m)  Wt 143 lb (64.864 kg)  BMI 27.03 kg/m2  SpO2 94% Physical Exam  Constitutional: She is oriented to person, place, and time. She appears well-developed and well-nourished. No distress.  HENT:  Head: Normocephalic and atraumatic.  Mouth/Throat:    Eyes: Pupils are equal, round, and reactive to light.  Neck: Normal range of motion.  Cardiovascular: Normal rate and intact distal pulses.   Pulmonary/Chest: No respiratory distress.  Abdominal: Normal appearance. She exhibits no distension.  Musculoskeletal: Normal range of motion.  Lymphadenopathy:    She has cervical adenopathy.       Right cervical: Superficial cervical adenopathy present.  Neurological: She is alert and oriented to person, place, and time. No cranial nerve deficit.  Skin: Skin is warm and dry. No rash noted.  Psychiatric: She has a normal mood and  affect. Her behavior is normal.  Nursing note and vitals reviewed.   ED Course  Procedures (including critical care time) Labs Review Labs Reviewed  CBC    Imaging Review No results found.    MDM   Final diagnoses:  Lymphadenopathy  Dental caries        Leonard Schwartz, MD 05/27/14 1317

## 2014-05-27 NOTE — ED Notes (Signed)
Patient C/O a lump in her right neck.  Also C/O A Carious tooth in her right lower jaw. RN notes a molar in her right lower jaw that is eroded to her gum line. Patient denies pain

## 2014-05-27 NOTE — Discharge Instructions (Signed)
Dental Caries Dental caries is tooth decay. This decay can cause a hole in teeth (cavity) that can get bigger and deeper over time. HOME CARE  Brush and floss your teeth. Do this at least two times a day.  Use a fluoride toothpaste.  Use a mouth rinse if told by your dentist or doctor.  Eat less sugary and starchy foods. Drink less sugary drinks.  Avoid snacking often on sugary and starchy foods. Avoid sipping often on sugary drinks.  Keep regular checkups and cleanings with your dentist.  Use fluoride supplements if told by your dentist or doctor.  Allow fluoride to be applied to teeth if told by your dentist or doctor. Document Released: 10/30/2007 Document Revised: 06/06/2013 Document Reviewed: 01/23/2012 [facility] Patient Information 2015 [place], [place]. This information is not intended to replace advice given to you by your health care provider. Make sure you discuss any questions you have with your health care provider.  Swollen Lymph Nodes The lymphatic system filters fluid from around cells. It is like a system of blood vessels. These channels carry lymph instead of blood. The lymphatic system is an important part of the immune (disease fighting) system. When people talk about "swollen glands in the neck," they are usually talking about swollen lymph nodes. The lymph nodes are like the little traps for infection. You and your caregiver may be able to feel lymph nodes, especially swollen nodes, in these common areas: the groin (inguinal area), armpits (axilla), and above the clavicle (supraclavicular). You may also feel them in the neck (cervical) and the back of the head just above the hairline (occipital). Swollen glands occur when there is any condition in which the body responds with an allergic type of reaction. For instance, the glands in the neck can become swollen from insect bites or any type of minor infection on the head. These are very noticeable in children with only minor  problems. Lymph nodes may also become swollen when there is a tumor or problem with the lymphatic system, such as Hodgkin's disease. TREATMENT   Most swollen glands do not require treatment. They can be observed (watched) for a short period of time, if your caregiver feels it is necessary. Most of the time, observation is not necessary.  Antibiotics (medicines that kill germs) may be prescribed by your caregiver. Your caregiver may prescribe these if he or she feels the swollen glands are due to a bacterial (germ) infection. Antibiotics are not used if the swollen glands are caused by a virus. HOME CARE INSTRUCTIONS   Take medications as directed by your caregiver. Only take over-the-counter or prescription medicines for pain, discomfort, or fever as directed by your caregiver. SEEK MEDICAL CARE IF:   If you begin to run a temperature greater than 102 F (38.9 C), or as your caregiver suggests. MAKE SURE YOU:   Understand these instructions.  Will watch your condition.  Will get help right away if you are not doing well or get worse. Document Released: 01/10/2002 Document Revised: 04/14/2011 Document Reviewed: 01/20/2005 [facility] Patient Information 2015 [place], [place]. This information is not intended to replace advice given to you by your health care provider. Make sure you discuss any questions you have with your health care provider.

## 2014-05-27 NOTE — ED Notes (Signed)
Pt reports recently evaluated by pcp for ? tmj pain on R side, states several days ago was driving and rubbing neck and felt a knot in her right side of jaw, unsure if related to dental cavity on right lower side.  No difficulty swallowing or breathing.

## 2014-05-29 ENCOUNTER — Telehealth: Payer: Self-pay | Admitting: Family Medicine

## 2014-05-29 NOTE — Telephone Encounter (Signed)
Patient aware.  She already got an appointment with a dentist in [place].

## 2014-05-29 NOTE — Telephone Encounter (Signed)
Dr. Lavonne Chick in [place].

## 2014-05-29 NOTE — Telephone Encounter (Signed)
Please advise 

## 2014-05-29 NOTE — Telephone Encounter (Signed)
Patient needs one of her teeth pulled. Her dentist retired. Patient is asking if there is anyone in this area that we can recommend?

## 2014-06-02 ENCOUNTER — Encounter: Payer: Self-pay | Admitting: Family Medicine

## 2014-06-02 ENCOUNTER — Other Ambulatory Visit: Payer: Self-pay | Admitting: Family Medicine

## 2014-06-02 ENCOUNTER — Ambulatory Visit (INDEPENDENT_AMBULATORY_CARE_PROVIDER_SITE_OTHER): Payer: Medicare Other | Admitting: Family Medicine

## 2014-06-02 VITALS — BP 123/77 | HR 78 | Temp 98.4°F | Resp 18 | Ht 61.0 in | Wt 145.0 lb

## 2014-06-02 DIAGNOSIS — R5383 Other fatigue: Secondary | ICD-10-CM | POA: Diagnosis not present

## 2014-06-02 DIAGNOSIS — R6884 Jaw pain: Secondary | ICD-10-CM

## 2014-06-02 DIAGNOSIS — R221 Localized swelling, mass and lump, neck: Secondary | ICD-10-CM | POA: Diagnosis not present

## 2014-06-02 LAB — CBC WITH DIFFERENTIAL/PLATELET
Basophils Absolute: 0 10*3/uL (ref 0.0–0.1)
Basophils Relative: 0 % (ref 0–1)
Eosinophils Absolute: 0.2 10*3/uL (ref 0.0–0.7)
Eosinophils Relative: 2 % (ref 0–5)
HCT: 36.2 % (ref 36.0–46.0)
Hemoglobin: 12.1 g/dL (ref 12.0–15.0)
Lymphocytes Relative: 21 % (ref 12–46)
Lymphs Abs: 2 10*3/uL (ref 0.7–4.0)
MCH: 26.8 pg (ref 26.0–34.0)
MCHC: 33.4 g/dL (ref 30.0–36.0)
MCV: 80.1 fL (ref 78.0–100.0)
MPV: 8.2 fL — ABNORMAL LOW (ref 8.6–12.4)
Monocytes Absolute: 0.8 10*3/uL (ref 0.1–1.0)
Monocytes Relative: 8 % (ref 3–12)
Neutro Abs: 6.6 10*3/uL (ref 1.7–7.7)
Neutrophils Relative %: 69 % (ref 43–77)
Platelets: 471 10*3/uL — ABNORMAL HIGH (ref 150–400)
RBC: 4.52 MIL/uL (ref 3.87–5.11)
RDW: 13.8 % (ref 11.5–15.5)
WBC: 9.5 10*3/uL (ref 4.0–10.5)

## 2014-06-02 LAB — COMPREHENSIVE METABOLIC PANEL
ALT: 11 U/L (ref 0–35)
AST: 12 U/L (ref 0–37)
Albumin: 3.7 g/dL (ref 3.5–5.2)
Alkaline Phosphatase: 101 U/L (ref 39–117)
BUN: 13 mg/dL (ref 6–23)
CO2: 22 mEq/L (ref 19–32)
Calcium: 9.2 mg/dL (ref 8.4–10.5)
Chloride: 101 mEq/L (ref 96–112)
Creat: 0.68 mg/dL (ref 0.50–1.10)
Glucose, Bld: 87 mg/dL (ref 70–99)
Potassium: 4.4 mEq/L (ref 3.5–5.3)
Sodium: 138 mEq/L (ref 135–145)
Total Bilirubin: 0.4 mg/dL (ref 0.2–1.2)
Total Protein: 6.8 g/dL (ref 6.0–8.3)

## 2014-06-02 LAB — C-REACTIVE PROTEIN: CRP: 10.5 mg/dL — ABNORMAL HIGH (ref <0.60)

## 2014-06-02 MED ORDER — AMLODIPINE BESYLATE 5 MG PO TABS: 5.0000 mg | ORAL_TABLET | Freq: Every day | ORAL | Status: DC

## 2014-06-02 NOTE — Progress Notes (Signed)
OFFICE NOTE  06/03/2014  CC: neck swelling  HPI: Patient is a 79 y.o. Caucasian female who is here for 1 wk of swelling on right side of anterior neck region, sore to touch.  No illness a the time of onset or just prior, no fevers in the preceding days.  She went to ED initially and got azith rx and was told it was likely lympadenopathy/adenitis related to a periodontal/tooth infection.  The abx did not improve anything and she went to a dentist and the dentist evaluated her and said it had nothing to do with the tooth.  The dentist put her on clindamycin, which she is currently on day 3 or 4 of.  Has night sweats vs nocturnal febrile episodes this week.   She is still also hurting all along the sides of both jaws/sides of neck.  Intermittently has had some paranasal sinus pain. No nasal congestion/mucous.  Mild diffuse HA's on and off.  ROS: no cats, no SOB, no cough.  Jaws hurt to open fully, no painful salivation. Ulcer on tip of tongue noted x 3d, also one on upper lip for the last week. Appetite is fine.  +Fatigue last 10d or so.  Denies feeling any swelling in underarm areas or groin.   Pertinent PMH:  Past medical, surgical, social, and family history reviewed and no changes are noted since last office visit.  MEDS:  Outpatient Prescriptions Prior to Visit  Medication Sig Dispense Refill  . albuterol (PROAIR HFA) 108 (90 BASE) MCG/ACT inhaler Inhale 2 puffs into the lungs every 4 (four) hours as needed. 1 Inhaler 5  . aspirin 81 MG tablet Take 81 mg by mouth daily.    Marland Kitchen conjugated estrogens (PREMARIN) vaginal cream Place vaginally daily. 42.5 g 12  . diazepam (VALIUM) 5 MG tablet TAKE 1 TABLET BY MOUTH EVERY 12 HOURS AS NEEDED 90 tablet 2  . Fluticasone-Salmeterol (ADVAIR DISKUS) 250-50 MCG/DOSE AEPB Inhale 1 puff into the lungs 2 (two) times daily. 1 each 6  . ipratropium (ATROVENT) 0.03 % nasal spray 2 sprays into each nostril q12h prn 30 mL 1  . loratadine (CLARITIN) 10 MG tablet  Take 10 mg by mouth as needed.      . meloxicam (MOBIC) 15 MG tablet Take 15 mg by mouth daily as needed for pain (per ortho).    Marland Kitchen omeprazole (PRILOSEC) 20 MG capsule Take 20 mg by mouth daily.      . RESTASIS 0.05 % ophthalmic emulsion Apply 1 drop to eye 2 (two) times daily.  4  . roflumilast (DALIRESP) 500 MCG TABS tablet Take 1 tablet by mouth  daily 90 tablet 1  . SPIRIVA HANDIHALER 18 MCG inhalation capsule INHALE CONTENTS OF 1 CAPSULE ONCE DAILY USING HANDIHALER AT THE SAME TIME 30 capsule 0  . SPIRIVA HANDIHALER 18 MCG inhalation capsule INHALE 1 CAPSULE VIA HANDIHALER EVERY DAY AT THE SAME TIME 30 capsule 6  . SYNTHROID 88 MCG tablet TAKE 1 TABLET BY MOUTH DAILY 30 tablet 0  . venlafaxine (EFFEXOR) 50 MG tablet TAKE 1 TABLET BY MOUTH TWICE DAILY 60 tablet 11  . amLODipine (NORVASC) 5 MG tablet Take 1 tablet (5 mg total) by mouth daily. 90 tablet 3  . azithromycin (ZITHROMAX) 250 MG tablet Take 1 tablet (250 mg total) by mouth daily. Take first 2 tablets together, then 1 every day until finished. 6 tablet 0   No facility-administered medications prior to visit.    PE: Blood pressure 123/77, pulse 78, temperature  98.4 F (36.9 C), temperature source Temporal, resp. rate 18, height _0  (1.549 m), weight 145 lb (65.772 kg), SpO2 92 %. Gen: Alert, well appearing.  Patient is oriented to person, place, time, and situation. HEENT: PERRLA, EOMI, no icterus.  Ears clear, TMs normal.  Nose clear. Lips: upper lip with small, crusted ulceration about 2-3 mm size on left side vermilion border. Tip of tongue with tiny superficial ulceration.  Otherwise oral mucosa is pink, moist, and without swelling or exudate or any focal lesion.  Teeth show no tenderness to palpation, no swelling gums.   She has mild/mod TTP in TMJ regions, L>R and seems to have a bit of limitation if full opening of mouth due to pain in these joints. No obvious swelling of parotid glands and these glands are really not that  tender, if at all. Right sided neck nodule palpable anteriorly just inferior to submandibular gland region.  Left submandibular gland itself feels tender but is soft and not enlarged.  Right submandibular gland soft, not enlarged, and nontender. No other nodule, mass, adenopathy in anterior, lateral, or posterior neck soft tissues.  No supraclavicular nodules/masses. CV: RRR, no m/r/g.   LUNGS: CTA bilat, nonlabored resps, good aeration in all lung fields. EXT: no clubbing, cyanosis, or edema.      IMPRESSION AND PLAN:  Acute right sided neck adenopathy/mass.  Unclear etiology.  Responding minimally, if any, to antibiotics at this time. 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 Will check CBC w/diff, CMET, CRP, ESR, and will continue her on clindamycin. Will order CT neck soft tissues with contrast.  An After Visit Summary was printed and given to the patient.  Spent 25 min with pt today, with >50% of this time spent in counseling and care coordination regarding the above problems.   FOLLOW UP: 1 wk

## 2014-06-03 LAB — SEDIMENTATION RATE: Sed Rate: 76 mm/hr — ABNORMAL HIGH (ref 0–30)

## 2014-06-05 ENCOUNTER — Encounter (HOSPITAL_BASED_OUTPATIENT_CLINIC_OR_DEPARTMENT_OTHER): Payer: Self-pay

## 2014-06-05 ENCOUNTER — Ambulatory Visit (HOSPITAL_BASED_OUTPATIENT_CLINIC_OR_DEPARTMENT_OTHER)
Admission: RE | Admit: 2014-06-05 | Discharge: 2014-06-05 | Disposition: A | Discharge status: Home / Self Care | Payer: Medicare Other | Source: Ambulatory Visit | Attending: Family Medicine | Admitting: Family Medicine

## 2014-06-05 DIAGNOSIS — R221 Localized swelling, mass and lump, neck: Secondary | ICD-10-CM | POA: Diagnosis not present

## 2014-06-05 DIAGNOSIS — J439 Emphysema, unspecified: Secondary | ICD-10-CM | POA: Diagnosis not present

## 2014-06-05 MED ORDER — IOHEXOL 300 MG/ML  SOLN
75.0000 mL | Freq: Once | INTRAMUSCULAR | Status: AC | PRN
  Administered 2014-06-05: 75 mL via INTRAVENOUS

## 2014-06-06 ENCOUNTER — Telehealth: Payer: Self-pay | Admitting: Pulmonary Disease

## 2014-06-06 ENCOUNTER — Other Ambulatory Visit: Payer: Self-pay | Admitting: Family Medicine

## 2014-06-06 MED ORDER — ROFLUMILAST 500 MCG PO TABS: ORAL_TABLET | ORAL | Status: DC

## 2014-06-06 MED ORDER — PREDNISONE 20 MG PO TABS: ORAL_TABLET | ORAL | Status: DC

## 2014-06-06 NOTE — Telephone Encounter (Signed)
Called pt and aware RX has been sent to [facility] for daliresp. Nothing further needed

## 2014-06-06 NOTE — Telephone Encounter (Signed)
Pt returned call - [phone]

## 2014-06-06 NOTE — Telephone Encounter (Signed)
lmomtcb x1 

## 2014-06-09 ENCOUNTER — Encounter: Payer: Self-pay | Admitting: Family Medicine

## 2014-06-09 ENCOUNTER — Ambulatory Visit (INDEPENDENT_AMBULATORY_CARE_PROVIDER_SITE_OTHER): Payer: Medicare Other | Admitting: Family Medicine

## 2014-06-09 VITALS — BP 155/94 | HR 69 | Temp 98.8°F | Resp 16 | Wt 147.0 lb

## 2014-06-09 DIAGNOSIS — R7982 Elevated C-reactive protein (CRP): Secondary | ICD-10-CM

## 2014-06-09 DIAGNOSIS — I10 Essential (primary) hypertension: Secondary | ICD-10-CM | POA: Diagnosis not present

## 2014-06-09 DIAGNOSIS — R700 Elevated erythrocyte sedimentation rate: Secondary | ICD-10-CM | POA: Diagnosis not present

## 2014-06-09 DIAGNOSIS — M2669 Other specified disorders of temporomandibular joint: Secondary | ICD-10-CM

## 2014-06-09 DIAGNOSIS — M26649 Arthritis of unspecified temporomandibular joint: Secondary | ICD-10-CM

## 2014-06-09 NOTE — Addendum Note (Signed)
Addended by: [name] on: 06/09/2014 06:10 PM   Modules accepted: Orders

## 2014-06-09 NOTE — Progress Notes (Addendum)
OFFICE NOTE  09/06/2014  CC:  Chief Complaint  Patient presents with  . Temporomandibular Joint Pain    follow up    HPI: Patient is a 79 y.o. Caucasian female who is here for  1 wk f/u recent problems with pain in both TMJ joints and slight swelling/tenderness in an area in right anterior neck region recently.  CT soft tissue of neck after most recent o/v was normal.  I started her on prednisone taper for bilat TMJ arthralgia/arthritis: this is her 3rd day of 29m qd dosing.  She has had no TA tenderness at recent visits, no complaints of any vision abnormalities. Reviewed all recent imaging and lab results in detail with pt: notable for increased ESR and CRP (normal ESR for this pt would be 48 or less).  She feels improved but jaw still tired when trying to chew, submandib/anter cerv region with much less pain/discomfort.  Some swelling-type feeling in the area of her lower central incisor partial region.   No fevers.  Has not required any tylenol today or yesterday.  No other joints bothering her.  Also of note, she ran out of bp meds 3 d/a, waiting for her mail order rx to get to her home.  Pertinent PMH:  Past medical, surgical, social, and family history reviewed and no changes are noted since last office visit.  MEDS:  Outpatient Prescriptions Prior to Visit  Medication Sig Dispense Refill  . albuterol (PROAIR HFA) 108 (90 BASE) MCG/ACT inhaler Inhale 2 puffs into the lungs every 4 (four) hours as needed. 1 Inhaler 5  . amLODipine (NORVASC) 5 MG tablet Take 1 tablet by mouth  daily 90 tablet 3  . amLODipine (NORVASC) 5 MG tablet Take 1 tablet (5 mg total) by mouth daily. 90 tablet 3  . aspirin 81 MG tablet Take 81 mg by mouth daily.    .Marland Kitchenconjugated estrogens (PREMARIN) vaginal cream Place vaginally daily. 42.5 g 12  . Fluticasone-Salmeterol (ADVAIR DISKUS) 250-50 MCG/DOSE AEPB Inhale 1 puff into the lungs 2 (two) times daily. 1 each 6  . ipratropium (ATROVENT) 0.03 % nasal spray  2 sprays into each nostril q12h prn 30 mL 1  . loratadine (CLARITIN) 10 MG tablet Take 10 mg by mouth as needed.      . meloxicam (MOBIC) 15 MG tablet Take 15 mg by mouth daily as needed for pain (per ortho).    .Marland Kitchenomeprazole (PRILOSEC) 20 MG capsule Take 20 mg by mouth daily.      . predniSONE (DELTASONE) 20 MG tablet 3 tabs po qd x 3d, then 2 tabs po qd x 3d, then 1 tab po qd x 3d, then 1/2 tab po qd x 4d, then stop. (Patient not taking: Reported on 08/16/2014) 20 tablet 0  . RESTASIS 0.05 % ophthalmic emulsion Apply 1 drop to eye 2 (two) times daily.  4  . roflumilast (DALIRESP) 500 MCG TABS tablet Take 1 tablet by mouth  daily 90 tablet 1  . SPIRIVA HANDIHALER 18 MCG inhalation capsule INHALE CONTENTS OF 1 CAPSULE ONCE DAILY USING HANDIHALER AT THE SAME TIME 30 capsule 0  . SPIRIVA HANDIHALER 18 MCG inhalation capsule INHALE 1 CAPSULE VIA HANDIHALER EVERY DAY AT THE SAME TIME 30 capsule 6  . venlafaxine (EFFEXOR) 50 MG tablet TAKE 1 TABLET BY MOUTH TWICE DAILY 60 tablet 11  . diazepam (VALIUM) 5 MG tablet TAKE 1 TABLET BY MOUTH EVERY 12 HOURS AS NEEDED 90 tablet 2  . SYNTHROID 88 MCG tablet  TAKE 1 TABLET BY MOUTH DAILY 30 tablet 0   No facility-administered medications prior to visit.    PE: Blood pressure 155/94, pulse 69, temperature 98.8 F (37.1 C), temperature source Temporal, resp. rate 16, weight 147 lb (66.679 kg), SpO2 91 %. Gen: Alert, well appearing.  Patient is oriented to person, place, time, and situation. QXI:HWTU: no injection, icteris, swelling, or exudate.  EOMI, PERRLA. Mouth: lips without lesion/swelling.  Oral mucosa pink and moist. Oropharynx without erythema, exudate, or swelling.  TMJs nontender, without crepitus or subluxation.  She can open jaw fully although this is uncomfortable (but not as painful as last exam). No warmth, swelling, or erythema of TMJs.  No tenderness in temples or forehead. Neck - No masses or thyromegaly or limitation in range of  motion Musculoskeletal: no joint swelling, erythema, warmth, or tenderness except as note.  ROM of all joints intact.   LABS:  Lab Results  Component Value Date   WBC 9.5 06/02/2014   HGB 12.1 06/02/2014   HCT 36.2 06/02/2014   MCV 80.1 06/02/2014   PLT 471* 06/02/2014   Lab Results  Component Value Date   ESRSEDRATE 76* 06/02/2014   Lab Results  Component Value Date   CRP 10.5* 06/02/2014     Chemistry      Component Value Date/Time   NA 141 08/16/2014 1501   K 4.2 08/16/2014 1501   CL 105 08/16/2014 1501   CO2 27 08/16/2014 1501   BUN 13 08/16/2014 1501   CREATININE 0.70 08/16/2014 1501   CREATININE 0.68 06/02/2014 1518      Component Value Date/Time   CALCIUM 9.6 08/16/2014 1501   ALKPHOS 104 08/16/2014 1501   AST 15 08/16/2014 1501   ALT 11 08/16/2014 1501   BILITOT 0.5 08/16/2014 1501     Lab Results  Component Value Date   TSH 2.86 05/08/2014    IMPRESSION AND PLAN:  1) Improving bilat TMJ arthralgia/arthritis: reassured pt. Continue prednisone taper. Recheck CRP and ESR in 3d.  Will check Rh factor and CCP antibody at that time since RA could present like this. After results are back, will discuss referral to rheumatologist with pt by phone.  2) HTN: out of med x 3d: we have arranged for her local pharmacy to dispense her amlodipine to bridge gap until mail order supply comes in for her.  An After Visit Summary was printed and given to the patient.  FOLLOW UP: prn

## 2014-06-09 NOTE — Progress Notes (Signed)
Pre visit review using our clinic review tool, if applicable. No additional management support is needed unless otherwise documented below in the visit note. 

## 2014-06-12 ENCOUNTER — Other Ambulatory Visit (INDEPENDENT_AMBULATORY_CARE_PROVIDER_SITE_OTHER): Payer: Medicare Other

## 2014-06-12 DIAGNOSIS — R7982 Elevated C-reactive protein (CRP): Secondary | ICD-10-CM | POA: Diagnosis not present

## 2014-06-12 DIAGNOSIS — R700 Elevated erythrocyte sedimentation rate: Secondary | ICD-10-CM

## 2014-06-12 DIAGNOSIS — M2669 Other specified disorders of temporomandibular joint: Secondary | ICD-10-CM

## 2014-06-12 DIAGNOSIS — M26649 Arthritis of unspecified temporomandibular joint: Secondary | ICD-10-CM

## 2014-06-12 LAB — RHEUMATOID FACTOR: Rhuematoid fact SerPl-aCnc: <10 IU/mL (ref <=14)

## 2014-06-13 ENCOUNTER — Telehealth: Payer: Self-pay | Admitting: Pulmonary Disease

## 2014-06-13 ENCOUNTER — Encounter: Payer: Self-pay | Admitting: Family Medicine

## 2014-06-13 NOTE — Telephone Encounter (Signed)
Pt calling for refill of Daliresp - states that her insurance is not wanting to cover this medication.  Pt states that she has hit the donut hole and she is having a hard time affording her monthly meds.  Samples put up front to cover the patient until we can get her insurance issue straightened out.  Pt refused Patient Assistance. I advised her that if she changes her mind its here and we can try it at any time.  ----- [facility] [phone] to check on coverage issue Daliresp needs PA, call [phone] Patient ID Priscilla Cantrell [id number] OR can be completed via [email or web address] (verified insurance with patient as what we have on file is different) ----- PA initiated through [email or web address] Determination within 5 business days Call to follow up [place] Pt aware.   Will hold in triage to follow up.

## 2014-06-14 LAB — CYCLIC CITRUL PEPTIDE ANTIBODY, IGG: Cyclic Citrullin Peptide Ab: <2 U/mL (ref 0.0–5.0)

## 2014-06-14 NOTE — Telephone Encounter (Signed)
Daliresp has been approved thru 06/13/15 under Medicare Part D GPI/NDC: [id number] Pt ID [id number] Pt and pharmacy notified.

## 2014-06-14 NOTE — Telephone Encounter (Signed)
PA request for Daliresp received from [facility] PA request submitted via covermymeds [id number]

## 2014-06-19 ENCOUNTER — Other Ambulatory Visit: Payer: Self-pay | Admitting: Family Medicine

## 2014-06-19 DIAGNOSIS — M26649 Arthritis of unspecified temporomandibular joint: Secondary | ICD-10-CM

## 2014-06-19 DIAGNOSIS — M2669 Other specified disorders of temporomandibular joint: Principal | ICD-10-CM

## 2014-06-22 ENCOUNTER — Telehealth: Payer: Self-pay | Admitting: Family Medicine

## 2014-06-22 NOTE — Telephone Encounter (Signed)
Pt. has requested Dr. Anitra Lauth call her as she has a couple things to talk to him about. She does not want to waste his time making an appt. For a few questions. Her Rheum appt is June 28. If Dr. Anitra Lauth calls before 1:30 pm please call [phone]. After 1:30 either cell or home phone are fine.

## 2014-06-23 ENCOUNTER — Telehealth: Payer: Self-pay | Admitting: Family Medicine

## 2014-06-23 DIAGNOSIS — IMO0001 Reserved for inherently not codable concepts without codable children: Secondary | ICD-10-CM

## 2014-06-23 DIAGNOSIS — M26649 Arthritis of unspecified temporomandibular joint: Secondary | ICD-10-CM

## 2014-06-23 DIAGNOSIS — M2669 Other specified disorders of temporomandibular joint: Principal | ICD-10-CM

## 2014-06-23 NOTE — Telephone Encounter (Signed)
Pt called to report that her bilat TMJ pain has been waxing and waning again (only 2-3/10 intensity) and she has also had intermittent days of feeling achiness and tenderness to palpation of the muscles in her legs (upper and lower, front and back)--but not joints or back. Ibuprofen 400 mg twice a day takes care of the pains. I told her to continue ibup 477m up to q6h prn but if she has to use it q6h for a few days in a row then she is to call and we'll either restart prednisone or see her in office. Otherwise, she is to come by the lab for CK total, ESR, CRP, and ANA at her convenience, preferably on a day in which she is hurting or right after she has been hurting.

## 2014-06-23 NOTE — Telephone Encounter (Signed)
LM on home and cell VM to call back at office.

## 2014-06-23 NOTE — Telephone Encounter (Signed)
Talked to pt today.  See telephone note for details.

## 2014-07-25 ENCOUNTER — Other Ambulatory Visit: Payer: Self-pay | Admitting: *Deleted

## 2014-07-25 MED ORDER — DIAZEPAM 5 MG PO TABS: 5.0000 mg | ORAL_TABLET | Freq: Two times a day (BID) | ORAL | Status: DC | PRN

## 2014-07-25 NOTE — Telephone Encounter (Addendum)
Dr. Anitra Lauth pt. RF request for diazepam. LOV: 05/08/14 (f/u in 4 months) Next ov: 09/06/14 Last written: 01/20/14 #90 w/ 2RF Please advise. Thanks.

## 2014-07-31 ENCOUNTER — Other Ambulatory Visit: Payer: Self-pay | Admitting: Family Medicine

## 2014-08-01 ENCOUNTER — Telehealth: Payer: Self-pay | Admitting: Family Medicine

## 2014-08-01 DIAGNOSIS — M25551 Pain in right hip: Secondary | ICD-10-CM | POA: Diagnosis not present

## 2014-08-01 DIAGNOSIS — M79642 Pain in left hand: Secondary | ICD-10-CM | POA: Diagnosis not present

## 2014-08-01 DIAGNOSIS — M25552 Pain in left hip: Secondary | ICD-10-CM | POA: Diagnosis not present

## 2014-08-01 DIAGNOSIS — M79641 Pain in right hand: Secondary | ICD-10-CM | POA: Diagnosis not present

## 2014-08-01 DIAGNOSIS — M25561 Pain in right knee: Secondary | ICD-10-CM | POA: Diagnosis not present

## 2014-08-01 DIAGNOSIS — M545 Low back pain: Secondary | ICD-10-CM | POA: Diagnosis not present

## 2014-08-01 DIAGNOSIS — M25562 Pain in left knee: Secondary | ICD-10-CM | POA: Diagnosis not present

## 2014-08-01 NOTE — Telephone Encounter (Signed)
RF request for Syntroid.  LOV: 06/09/14 Next ov: 09/18/14 Last written: 06/07/14 #30 w/ 0RF TSH checked on 05/08/14 result: 2.86

## 2014-08-01 NOTE — Telephone Encounter (Signed)
Patient is still having back pain. Patient said that it was mentioned during OV that she could take prednisone for her back. Can she get an Rx?

## 2014-08-02 DIAGNOSIS — M2550 Pain in unspecified joint: Secondary | ICD-10-CM | POA: Diagnosis not present

## 2014-08-02 DIAGNOSIS — R5381 Other malaise: Secondary | ICD-10-CM | POA: Diagnosis not present

## 2014-08-02 DIAGNOSIS — E559 Vitamin D deficiency, unspecified: Secondary | ICD-10-CM | POA: Diagnosis not present

## 2014-08-02 NOTE — Telephone Encounter (Signed)
Pls ask pt if her rheumatologist prescribed any new meds for her. Then I can make decision about the prednisone.-thx

## 2014-08-02 NOTE — Telephone Encounter (Signed)
Left message for pt to call back  °

## 2014-08-03 ENCOUNTER — Telehealth: Payer: Self-pay | Admitting: Family Medicine

## 2014-08-03 NOTE — Telephone Encounter (Signed)
Pt does not need RX for predisone. She is feeling better. Will call if she gets worse.

## 2014-08-03 NOTE — Telephone Encounter (Signed)
NOTED

## 2014-08-03 NOTE — Telephone Encounter (Signed)
FYI

## 2014-08-04 HISTORY — PX: TRANSTHORACIC ECHOCARDIOGRAM: SHX275

## 2014-08-16 ENCOUNTER — Ambulatory Visit (INDEPENDENT_AMBULATORY_CARE_PROVIDER_SITE_OTHER): Payer: Medicare Other | Admitting: Family Medicine

## 2014-08-16 ENCOUNTER — Encounter: Payer: Self-pay | Admitting: Family Medicine

## 2014-08-16 VITALS — BP 143/81 | HR 69 | Temp 97.5°F | Resp 16 | Ht 61.0 in | Wt 148.0 lb

## 2014-08-16 DIAGNOSIS — R609 Edema, unspecified: Secondary | ICD-10-CM | POA: Diagnosis not present

## 2014-08-16 DIAGNOSIS — I872 Venous insufficiency (chronic) (peripheral): Secondary | ICD-10-CM | POA: Diagnosis not present

## 2014-08-16 DIAGNOSIS — Z01818 Encounter for other preprocedural examination: Secondary | ICD-10-CM | POA: Diagnosis not present

## 2014-08-16 LAB — POCT URINALYSIS DIPSTICK
Bilirubin, UA: NEGATIVE
Blood, UA: NEGATIVE
Glucose, UA: NEGATIVE
Ketones, UA: NEGATIVE
Nitrite, UA: NEGATIVE
Protein, UA: NEGATIVE
Spec Grav, UA: 1.015
Urobilinogen, UA: 0.2
pH, UA: 7

## 2014-08-16 NOTE — Progress Notes (Signed)
OFFICE VISIT  08/16/2014   CC:  Chief Complaint  Patient presents with  . Leg Swelling    feet and ankles bilateral x 2-3 weeks   HPI:    Patient is a 79 y.o. Caucasian female who presents for bilat ankle/feet swelling. Last few weeks, fluid builds up in ankles/feet as the day goes on.  Gets up in morning and hardly has any. Today swelling has been worse since morning so she came in to get checked.  No pain.   Back has not been bothering her as much lately.  Sounds like relatively stable activity level and Na intake lately.  ROS: no SOB, CP, PND, or orthopnea, no rash of legs  Past Medical History  Diagnosis Date  . Hypertension     EKG 03/2010 normal  . COPD (chronic obstructive pulmonary disease)     spirometry 01/10/09 FEV 0.97(52%), FEV1% 47  . Hypoxemia     O2 86% RA with exertion 01/10/09, 2 liters oxygen with exertion and sleep  . Pulmonary nodule     CT chest 12/10, June 2011.  No nodule on CT 06/2010.  Marland Kitchen Spinal stenosis     Symptomatic (neurogenic claudication)  . GERD (gastroesophageal reflux disease)   . Hypothyroidism     Hashimoto's, dx'd 1989  . Insomnia   . Plantar fasciitis   . Diverticulosis of colon     Procto '97 and colonoscopy 2002  . DDD (degenerative disc disease), cervical     1998 MRI C5-6 impingemt (left)  . DDD (degenerative disc disease), lumbar   . Spinal stenosis of lumbar region with neurogenic claudication     2008 MRI (L3-4, L4-5, L5-S1)  . Fibrocystic breast disease     w/fibroadenoma.  Bx's showed NO atypia (Dr. Margot Chimes).  Mammo neg 2008.  Marland Kitchen Chest pain, non-cardiac     Cardiac CT showed no signif obstructive dz, mildly elevated calcium level (Dr. [name]).  . History of double vision     Ophthalmologist, Dr. Herbert Deaner, is further evaluating this with MRI  orbits and limited brain (myesthenia gravis testing neg 07/2010)  . Depression with anxiety 04/15/2011  . Asthma   . Dysplastic nevus of upper extremity 04/2014    R tricep ([name])     Past Surgical History  Procedure Laterality Date  . Cholecystectomy  2001  . Tubal ligation  1974  . Abdominal hysterectomy  1978    no BSO per pt--nonmalignant reasons  . Breast cyst excision      Last screening mammogram 10/2010 was normal.  . Appendectomy    . Tonsillectomy    . Colonoscopy  04/2005    NORMAL-recall 10 yrs   MEDS: not taking prednisone listed below Outpatient Prescriptions Prior to Visit  Medication Sig Dispense Refill  . albuterol (PROAIR HFA) 108 (90 BASE) MCG/ACT inhaler Inhale 2 puffs into the lungs every 4 (four) hours as needed. 1 Inhaler 5  . amLODipine (NORVASC) 5 MG tablet Take 1 tablet by mouth  daily 90 tablet 3  . amLODipine (NORVASC) 5 MG tablet Take 1 tablet (5 mg total) by mouth daily. 90 tablet 3  . aspirin 81 MG tablet Take 81 mg by mouth daily.    Marland Kitchen conjugated estrogens (PREMARIN) vaginal cream Place vaginally daily. 42.5 g 12  . diazepam (VALIUM) 5 MG tablet Take 1 tablet (5 mg total) by mouth every 12 (twelve) hours as needed. 45 tablet 0  . Fluticasone-Salmeterol (ADVAIR DISKUS) 250-50 MCG/DOSE AEPB Inhale 1 puff into the  lungs 2 (two) times daily. 1 each 6  . ipratropium (ATROVENT) 0.03 % nasal spray 2 sprays into each nostril q12h prn 30 mL 1  . loratadine (CLARITIN) 10 MG tablet Take 10 mg by mouth as needed.      Marland Kitchen omeprazole (PRILOSEC) 20 MG capsule Take 20 mg by mouth daily.      . RESTASIS 0.05 % ophthalmic emulsion Apply 1 drop to eye 2 (two) times daily.  4  . roflumilast (DALIRESP) 500 MCG TABS tablet Take 1 tablet by mouth  daily 90 tablet 1  . SPIRIVA HANDIHALER 18 MCG inhalation capsule INHALE CONTENTS OF 1 CAPSULE ONCE DAILY USING HANDIHALER AT THE SAME TIME 30 capsule 0  . SPIRIVA HANDIHALER 18 MCG inhalation capsule INHALE 1 CAPSULE VIA HANDIHALER EVERY DAY AT THE SAME TIME 30 capsule 6  . SYNTHROID 88 MCG tablet TAKE 1 TABLET BY MOUTH DAILY 30 tablet 6  . venlafaxine (EFFEXOR) 50 MG tablet TAKE 1 TABLET BY MOUTH TWICE DAILY  60 tablet 11  . meloxicam (MOBIC) 15 MG tablet Take 15 mg by mouth daily as needed for pain (per ortho).    . predniSONE (DELTASONE) 20 MG tablet 3 tabs po qd x 3d, then 2 tabs po qd x 3d, then 1 tab po qd x 3d, then 1/2 tab po qd x 4d, then stop. (Patient not taking: Reported on 08/16/2014) 20 tablet 0   No facility-administered medications prior to visit.    Allergies  Allergen Reactions  . Losartan Other (See Comments)    hyperkalemia  . Penicillins Hives, Swelling and Rash  . Cefixime [Kdc:Cefixime] Other (See Comments)    unspecified  . Indocin [Indomethacin] Other (See Comments)    Painful tongue and throat  . Sulfa Antibiotics Other (See Comments)    unspecified  . Ace Inhibitors Other (See Comments)    cough  . Statins Other (See Comments)    Myalgias     ROS As per HPI  PE: Blood pressure 143/81, pulse 69, temperature 97.5 F (36.4 C), temperature source Oral, resp. rate 16, height 5\' 1"  (1.549 m), weight 148 lb (67.132 kg), SpO2 93 %. Gen: Alert, well appearing.  Patient is oriented to person, place, time, and situation. Neck - No masses or thyromegaly or limitation in range of motion CV: RRR, no m/r/g.   LUNGS: CTA bilat, nonlabored resps, good aeration in all lung fields. EXT: no edema in arms or hands. She has 1+ pitting edema in both LL's from the knees down into feet.  She has some scattered non-inflamed varicosities and spider veins.  Mild diffuse freckling-type hyperpigmentation changes in LL's. DP and PT pulses 2+ bilat.  LABS:  CC UA today: normal except trace LEU  IMPRESSION AND PLAN:  Worsening of chronic LE venous insufficiency. Will check LE venous doppler u/s ASAP. Discussed low Na diet and elevation of legs above heart level 20 min qd prn. Discussed "no cure" for this, but can minimize by the above measures, occasionally have to add compression stockings and/or diuretic (not rx'd today). Check CMET today.  Will also order echocardiogram to  assess for elevated RV pressures/signs of cor pulmonale----this will likely be helpful in prep for her possible upcoming hip surgery (rheum said she has end stage DJD both hips and needs hip replacements, so she'll be contacting her orthopedist, Dr. Gladstone Lighter, in the near future to get further eval/opinion on this.  An After Visit Summary was printed and given to the patient.  FOLLOW UP:  already has appt 09/06/14

## 2014-08-16 NOTE — Patient Instructions (Signed)
Elevate lower legs above the level of the heart for 20 min daily. Try to eat a low sodium diet (2g per day or less)--see handout.

## 2014-08-16 NOTE — Progress Notes (Signed)
Pre visit review using our clinic review tool, if applicable. No additional management support is needed unless otherwise documented below in the visit note. 

## 2014-08-17 LAB — COMPREHENSIVE METABOLIC PANEL
ALT: 11 U/L (ref 0–35)
AST: 15 U/L (ref 0–37)
Albumin: 4.2 g/dL (ref 3.5–5.2)
Alkaline Phosphatase: 104 U/L (ref 39–117)
BUN: 13 mg/dL (ref 6–23)
CO2: 27 mEq/L (ref 19–32)
Calcium: 9.6 mg/dL (ref 8.4–10.5)
Chloride: 105 mEq/L (ref 96–112)
Creatinine, Ser: 0.7 mg/dL (ref 0.40–1.20)
GFR: 86.18 mL/min (ref >60.00)
Glucose, Bld: 83 mg/dL (ref 70–99)
Potassium: 4.2 mEq/L (ref 3.5–5.1)
Sodium: 141 mEq/L (ref 135–145)
Total Bilirubin: 0.5 mg/dL (ref 0.2–1.2)
Total Protein: 7 g/dL (ref 6.0–8.3)

## 2014-08-17 NOTE — Telephone Encounter (Signed)
NOTED

## 2014-08-17 NOTE — Telephone Encounter (Signed)
Patient called & asked that I let Dr. Anitra Lauth know that she thinks that part of the reason her legs were so swollen yesterday is because she fell asleep in the recliner the night before & didn't move to the bed until 2:30 AM. No CB requested.

## 2014-08-23 ENCOUNTER — Ambulatory Visit (HOSPITAL_BASED_OUTPATIENT_CLINIC_OR_DEPARTMENT_OTHER)
Admission: RE | Admit: 2014-08-23 | Discharge: 2014-08-23 | Disposition: A | Discharge status: Home / Self Care | Payer: Medicare Other | Source: Ambulatory Visit | Attending: Family Medicine | Admitting: Family Medicine

## 2014-08-23 DIAGNOSIS — R609 Edema, unspecified: Secondary | ICD-10-CM

## 2014-08-23 DIAGNOSIS — M7989 Other specified soft tissue disorders: Secondary | ICD-10-CM | POA: Diagnosis present

## 2014-08-30 ENCOUNTER — Ambulatory Visit (HOSPITAL_BASED_OUTPATIENT_CLINIC_OR_DEPARTMENT_OTHER)
Admission: RE | Admit: 2014-08-30 | Discharge: 2014-08-30 | Disposition: A | Discharge status: Home / Self Care | Payer: Medicare Other | Source: Ambulatory Visit | Attending: Family Medicine | Admitting: Family Medicine

## 2014-08-30 DIAGNOSIS — Z01818 Encounter for other preprocedural examination: Secondary | ICD-10-CM | POA: Diagnosis not present

## 2014-08-30 DIAGNOSIS — I10 Essential (primary) hypertension: Secondary | ICD-10-CM | POA: Diagnosis not present

## 2014-08-30 DIAGNOSIS — R609 Edema, unspecified: Secondary | ICD-10-CM

## 2014-08-30 NOTE — Progress Notes (Signed)
  Echocardiogram 2D Echocardiogram has been performed.  Darlina Sicilian M 08/30/2014, 10:41 AM

## 2014-08-31 ENCOUNTER — Encounter (HOSPITAL_BASED_OUTPATIENT_CLINIC_OR_DEPARTMENT_OTHER): Payer: Self-pay

## 2014-09-05 DIAGNOSIS — M160 Bilateral primary osteoarthritis of hip: Secondary | ICD-10-CM | POA: Diagnosis not present

## 2014-09-05 DIAGNOSIS — M19041 Primary osteoarthritis, right hand: Secondary | ICD-10-CM | POA: Diagnosis not present

## 2014-09-05 DIAGNOSIS — M5137 Other intervertebral disc degeneration, lumbosacral region: Secondary | ICD-10-CM | POA: Diagnosis not present

## 2014-09-05 DIAGNOSIS — E559 Vitamin D deficiency, unspecified: Secondary | ICD-10-CM | POA: Diagnosis not present

## 2014-09-06 ENCOUNTER — Ambulatory Visit (INDEPENDENT_AMBULATORY_CARE_PROVIDER_SITE_OTHER): Payer: Medicare Other | Admitting: Family Medicine

## 2014-09-06 ENCOUNTER — Encounter: Payer: Self-pay | Admitting: Family Medicine

## 2014-09-06 VITALS — BP 104/62 | HR 73 | Temp 97.6°F | Resp 16 | Ht 61.0 in | Wt 144.0 lb

## 2014-09-06 DIAGNOSIS — J438 Other emphysema: Secondary | ICD-10-CM

## 2014-09-06 DIAGNOSIS — I10 Essential (primary) hypertension: Secondary | ICD-10-CM

## 2014-09-06 DIAGNOSIS — M150 Primary generalized (osteo)arthritis: Secondary | ICD-10-CM

## 2014-09-06 DIAGNOSIS — E039 Hypothyroidism, unspecified: Secondary | ICD-10-CM | POA: Diagnosis not present

## 2014-09-06 DIAGNOSIS — I872 Venous insufficiency (chronic) (peripheral): Secondary | ICD-10-CM

## 2014-09-06 DIAGNOSIS — M8949 Other hypertrophic osteoarthropathy, multiple sites: Secondary | ICD-10-CM

## 2014-09-06 DIAGNOSIS — M159 Polyosteoarthritis, unspecified: Secondary | ICD-10-CM

## 2014-09-06 NOTE — Progress Notes (Signed)
Pre visit review using our clinic review tool, if applicable. No additional management support is needed unless otherwise documented below in the visit note. 

## 2014-09-06 NOTE — Progress Notes (Signed)
OFFICE NOTE  09/06/2014  CC:  Chief Complaint  Patient presents with  . Follow-up   HPI: Patient is a 79 y.o. Caucasian female who is here for f/u:  Has to get hip replacement surgery.  Very bad pain in R hip/thigh this morning, lasted 1/2 day and now is resolved.  Takes 2 ibup qAM and 2 tylenol hs. Swelling is not as bad. Has plans to see [name] in a couple weeks to get pre-op pulm clearance.  She currently feels good from a resp standpoint.   She is compliant with bp med and with low Na diet.    Pertinent PMH:  Past medical, surgical, social, and family history reviewed and no changes are noted since last office visit.  MEDS:  Outpatient Prescriptions Prior to Visit  Medication Sig Dispense Refill  . albuterol (PROAIR HFA) 108 (90 BASE) MCG/ACT inhaler Inhale 2 puffs into the lungs every 4 (four) hours as needed. 1 Inhaler 5  . amLODipine (NORVASC) 5 MG tablet Take 1 tablet (5 mg total) by mouth daily. 90 tablet 3  . aspirin 81 MG tablet Take 81 mg by mouth daily.    Marland Kitchen conjugated estrogens (PREMARIN) vaginal cream Place vaginally daily. 42.5 g 12  . diazepam (VALIUM) 5 MG tablet Take 1 tablet (5 mg total) by mouth every 12 (twelve) hours as needed. 45 tablet 0  . Fluticasone-Salmeterol (ADVAIR DISKUS) 250-50 MCG/DOSE AEPB Inhale 1 puff into the lungs 2 (two) times daily. 1 each 6  . ipratropium (ATROVENT) 0.03 % nasal spray 2 sprays into each nostril q12h prn 30 mL 1  . loratadine (CLARITIN) 10 MG tablet Take 10 mg by mouth as needed.      . meloxicam (MOBIC) 15 MG tablet Take 15 mg by mouth daily as needed for pain (per ortho).    Marland Kitchen omeprazole (PRILOSEC) 20 MG capsule Take 20 mg by mouth daily.      . RESTASIS 0.05 % ophthalmic emulsion Apply 1 drop to eye 2 (two) times daily.  4  . roflumilast (DALIRESP) 500 MCG TABS tablet Take 1 tablet by mouth  daily 90 tablet 1  . SPIRIVA HANDIHALER 18 MCG inhalation capsule INHALE 1 CAPSULE VIA HANDIHALER EVERY DAY AT THE SAME TIME 30  capsule 6  . SYNTHROID 88 MCG tablet TAKE 1 TABLET BY MOUTH DAILY 30 tablet 6  . venlafaxine (EFFEXOR) 50 MG tablet TAKE 1 TABLET BY MOUTH TWICE DAILY 60 tablet 11  . amLODipine (NORVASC) 5 MG tablet Take 1 tablet by mouth  daily (Patient not taking: Reported on 09/06/2014) 90 tablet 3  . predniSONE (DELTASONE) 20 MG tablet 3 tabs po qd x 3d, then 2 tabs po qd x 3d, then 1 tab po qd x 3d, then 1/2 tab po qd x 4d, then stop. (Patient not taking: Reported on 08/16/2014) 20 tablet 0  . SPIRIVA HANDIHALER 18 MCG inhalation capsule INHALE CONTENTS OF 1 CAPSULE ONCE DAILY USING HANDIHALER AT THE SAME TIME (Patient not taking: Reported on 09/06/2014) 30 capsule 0   No facility-administered medications prior to visit.    PE: Blood pressure 104/62, pulse 73, temperature 97.6 F (36.4 C), temperature source Oral, resp. rate 16, height 5\' 1"  (1.549 m), weight 144 lb (65.318 kg), SpO2 96 %. Gen: Alert, well appearing.  Patient is oriented to person, place, time, and situation. EXT: trace bilat pitting edema in both LL's, with scattered non-inflamed varicosities.  No erythema or skin breakdown.  LAB:  Lab Results  Component  Value Date   TSH 2.86 05/08/2014     Chemistry      Component Value Date/Time   NA 141 08/16/2014 1501   K 4.2 08/16/2014 1501   CL 105 08/16/2014 1501   CO2 27 08/16/2014 1501   BUN 13 08/16/2014 1501   CREATININE 0.70 08/16/2014 1501   CREATININE 0.68 06/02/2014 1518      Component Value Date/Time   CALCIUM 9.6 08/16/2014 1501   ALKPHOS 104 08/16/2014 1501   AST 15 08/16/2014 1501   ALT 11 08/16/2014 1501   BILITOT 0.5 08/16/2014 1501     IMPRESSION AND PLAN:  1) COPD: The current medical regimen is effective;  continue present plan and medications.  2) LE venous insufficiency: continue current measures.  3) Hypothyroidism: The current medical regimen is effective;  continue present plan and medications. Recheck TSH 05/2015.  4) HTN: well controlled now that she is  back on her meds.  5) Osteoarthritis, multiple sites--hips are the most symptomatic of late.  She is preparing for hip replacement surgery (? Dr. Gladstone Lighter).   Has f/u with rheum, Dr. Trudie Reed, in about 9 mo.  Spent 30 min with pt today, with >50% of this time spent in counseling and care coordination regarding the above problems.  An After Visit Summary was printed and given to the patient.  FOLLOW UP: 6 mo

## 2014-09-18 ENCOUNTER — Encounter: Payer: Self-pay | Admitting: Pulmonary Disease

## 2014-09-18 ENCOUNTER — Ambulatory Visit (INDEPENDENT_AMBULATORY_CARE_PROVIDER_SITE_OTHER): Payer: Medicare Other | Admitting: Pulmonary Disease

## 2014-09-18 VITALS — BP 110/62 | HR 73 | Ht 60.0 in | Wt 149.2 lb

## 2014-09-18 DIAGNOSIS — R0902 Hypoxemia: Secondary | ICD-10-CM | POA: Diagnosis not present

## 2014-09-18 DIAGNOSIS — R05 Cough: Secondary | ICD-10-CM | POA: Diagnosis not present

## 2014-09-18 DIAGNOSIS — R058 Other specified cough: Secondary | ICD-10-CM

## 2014-09-18 DIAGNOSIS — J449 Chronic obstructive pulmonary disease, unspecified: Secondary | ICD-10-CM | POA: Diagnosis not present

## 2014-09-18 NOTE — Patient Instructions (Signed)
Follow up in 6 months 

## 2014-09-18 NOTE — Progress Notes (Signed)
Chief Complaint  Patient presents with  . Follow-up    breathing not doing too well in the hot weather; hip pain.  oxygen level dropping.  Patient wants [name] to review xrays sent from [facility].  Pt says she may need to have surgery on her hip and may need surgical clearance in the future.    History of Present Illness: Priscilla Cantrell is a 79 y.o. female former smoker with GOLD 4 COPD, ACE related cough, and hypoxemia on 2 liters oxygen with sleep.  She was seen recently by Dr. Estanislado Pandy with rheumatology for evaluation of b/l hip pain.  There was concern that she could have RA, but was felt to actually be osteoarthritis.  She says her hips are "bone-on-bone".  This is causing significant pain, and severely limiting her activity.  She says she can't walk through grocery store on her own, and needs a cart.  She has to stop frequently to alleviate pain.  If she goes out anywhere, then she is wiped out for the rest of the day due to pain symptoms.  Her breathing has been stable.  She is not having cough, wheeze, sputum, or chest pain.  She has leg swelling in her ankles, but not worse than usual.  She uses 2 liters oxygen at night.  She feels daliresp has helped tremendously with her breathing.  Tests: Spirometry 01/10/09>>FEV1 0.97(52%), FEV1% 47 March 2012>>Daliresp started PSG 11/14/11 >> AHI 0.3 Echo 08/30/14 >> EF 60 to 51%, grade 1 diastolic dysfx, PAS 24 mmHG  PMHx >> Spinal stenosis, GERD, Hypothyroidism, Depression, Anxiety  PSHx, Medications, Allergies, Fhx, Shx reviewed.  Physical Exam: BP 110/62 mmHg  Pulse 73  Ht 5' (1.524 m)  Wt 149 lb 3.2 oz (67.677 kg)  BMI 29.14 kg/m2  SpO2 96%  General - Thin HEENT - No sinus tenderness, no oral exudate, no LAN Cardiac - s1s2 no murmur Chest - prolonged exhalation, no wheeze/rales Abdomen - soft, nontender Extremities - no edema Skin - no rashes Neurologic - normal strength Psychiatric - normal mood,  behavior   Assessment/Plan:  COPD with chronic bronchitis and emphysema. Stable on current regimen. Plan: - continue spiriva, advair, daliresp and prn proair  Chronic respiratory failure with sleep related hypoxia. Plan: - continue supplemental oxygen at night  Upper airway cough syndrome. Stable. Plan: - prn claritin  Pre-operative respiratory assessment. Discussion: She has pending appointment with orthopedics to assess for hip replacement surgery.  I explained that her hx of COPD and nocturnal hypoxia would put her at higher risk for peri-operative pulmonary complications if she needs to have hip surgery.  Her quality of life is significantly impacted by her pain symptoms.  As such, I think she can proceed with surgery if needed.  Concern will also be how well she will be able to keep up with physical therapy after surgery. Plan: - no additional pulmonary testing prior to her having surgery - pulmonary service can be available as needed after she has surgery   Chesley Mires, MD [place] Pager:  [phone]

## 2014-09-20 ENCOUNTER — Other Ambulatory Visit: Payer: Self-pay | Admitting: *Deleted

## 2014-09-20 MED ORDER — VENLAFAXINE HCL 50 MG PO TABS: 50.0000 mg | ORAL_TABLET | Freq: Two times a day (BID) | ORAL | Status: DC

## 2014-09-20 NOTE — Telephone Encounter (Signed)
RF request for venlafaxine LOV: 09/06/14 Next ov: 03/08/15 Last written: 07/11/13 #60 w/ 11RF

## 2014-09-21 DIAGNOSIS — M160 Bilateral primary osteoarthritis of hip: Secondary | ICD-10-CM | POA: Diagnosis not present

## 2014-09-21 DIAGNOSIS — M4806 Spinal stenosis, lumbar region: Secondary | ICD-10-CM | POA: Diagnosis not present

## 2014-09-22 ENCOUNTER — Encounter: Payer: Self-pay | Admitting: Family Medicine

## 2014-09-26 ENCOUNTER — Telehealth: Payer: Self-pay | Admitting: Pulmonary Disease

## 2014-09-26 NOTE — Telephone Encounter (Signed)
Patient called back and states it's [facility] but she does not know the fax number where to send notes.  She can be reached at [phone] or [phone].

## 2014-09-26 NOTE — Telephone Encounter (Signed)
Called [facility] at [phone]. Spoke with Margarita Grizzle and will need to fax notes to [phone]. OV note faxed.  Called and spoke to pt. Informed her the OV note has been faxed to Dr. Charlestine Night office. Pt verbalized understanding and denied any further questions or concerns at this time.

## 2014-09-26 NOTE — Telephone Encounter (Signed)
LMTCB x 1 for patient Need to know fax number of where to send notes.  Pt was cleared for surgery at last OV per [facility] Ov notes printed and are in triage.  Will await pt call back.

## 2014-09-28 ENCOUNTER — Other Ambulatory Visit: Payer: Self-pay | Admitting: Surgical

## 2014-10-11 ENCOUNTER — Encounter: Payer: Self-pay | Admitting: Family Medicine

## 2014-10-11 ENCOUNTER — Ambulatory Visit (INDEPENDENT_AMBULATORY_CARE_PROVIDER_SITE_OTHER): Payer: Medicare Other | Admitting: Family Medicine

## 2014-10-11 VITALS — BP 116/73 | HR 72 | Temp 97.4°F | Resp 16 | Ht 60.0 in | Wt 143.0 lb

## 2014-10-11 DIAGNOSIS — Z23 Encounter for immunization: Secondary | ICD-10-CM

## 2014-10-11 DIAGNOSIS — J441 Chronic obstructive pulmonary disease with (acute) exacerbation: Secondary | ICD-10-CM

## 2014-10-11 DIAGNOSIS — J069 Acute upper respiratory infection, unspecified: Secondary | ICD-10-CM

## 2014-10-11 MED ORDER — METHYLPREDNISOLONE ACETATE 80 MG/ML IJ SUSP
80.0000 mg | Freq: Once | INTRAMUSCULAR | Status: AC
  Administered 2014-10-11: 80 mg via INTRAMUSCULAR

## 2014-10-11 MED ORDER — CETIRIZINE HCL 10 MG PO TABS: 10.0000 mg | ORAL_TABLET | Freq: Every day | ORAL | Status: DC

## 2014-10-11 MED ORDER — AZITHROMYCIN 250 MG PO TABS: ORAL_TABLET | ORAL | Status: DC

## 2014-10-11 NOTE — Progress Notes (Signed)
OFFICE VISIT  10/11/2014   CC:  Chief Complaint  Patient presents with  . Cough    x 3 days non-productive, nasal congestion  . COPD   HPI:    Patient is a 79 y.o. Caucasian female who presents for concern of having bronchitis. Of note, she saw [name], her pulmonologist, on 09/18/14 and he cleared her for her hip surgery from a pulmonary standpoint, although she will be at higher peri-operative risk of pulm problems.  No further testing was required.  Onset about 4 days ago, PND, hacky cough, some increased breathlessness lately, O2 sat 88% at rest at home today briefly, would go up to low 90s. Subjective f/c night-time.  Took zyrtec recently but it was 2 yrs expired.  Took mucinex when it first started. No ST or HA.  No chest tightness.  Brief wheezing has been heard after a coughing spell.   Past Medical History  Diagnosis Date  . Hypertension     EKG 03/2010 normal  . COPD (chronic obstructive pulmonary disease)     spirometry 01/10/09 FEV 0.97(52%), FEV1% 47  . Hypoxemia     O2 86% RA with exertion 01/10/09, 2 liters oxygen with exertion and sleep  . Pulmonary nodule     CT chest 12/10, June 2011.  No nodule on CT 06/2010.  Marland Kitchen Spinal stenosis     Symptomatic (neurogenic claudication)  . GERD (gastroesophageal reflux disease)   . Hypothyroidism     Hashimoto's, dx'd 1989  . Insomnia   . Plantar fasciitis   . Diverticulosis of colon     Procto '97 and colonoscopy 2002  . DDD (degenerative disc disease), cervical     1998 MRI C5-6 impingemt (left)  . DDD (degenerative disc disease), lumbar   . Spinal stenosis of lumbar region with neurogenic claudication     2008 MRI (L3-4, L4-5, L5-S1)  . Fibrocystic breast disease     w/fibroadenoma.  Bx's showed NO atypia (Dr. Margot Chimes).  Mammo neg 2008.  Marland Kitchen Chest pain, non-cardiac     Cardiac CT showed no signif obstructive dz, mildly elevated calcium level (Dr. [name]).  . History of double vision     Ophthalmologist, Dr. Herbert Deaner, is  further evaluating this with MRI  orbits and limited brain (myesthenia gravis testing neg 07/2010)  . Depression with anxiety 04/15/2011  . Asthma   . Dysplastic nevus of upper extremity 04/2014    R tricep ([name])  . Osteoarthritis, hip, bilateral     End stage; plan for R THA w/[facility] as of 09/22/14    Past Surgical History  Procedure Laterality Date  . Cholecystectomy  2001  . Tubal ligation  1974  . Abdominal hysterectomy  1978    no BSO per pt--nonmalignant reasons  . Breast cyst excision      Last screening mammogram 10/2010 was normal.  . Appendectomy    . Tonsillectomy    . Colonoscopy  04/2005    NORMAL-recall 10 yrs  . Transthoracic echocardiogram  08/2014    EF 60-65%, grade I diast dysfxn    Outpatient Prescriptions Prior to Visit  Medication Sig Dispense Refill  . albuterol (PROAIR HFA) 108 (90 BASE) MCG/ACT inhaler Inhale 2 puffs into the lungs every 4 (four) hours as needed. 1 Inhaler 5  . amLODipine (NORVASC) 5 MG tablet Take 1 tablet (5 mg total) by mouth daily. 90 tablet 3  . aspirin 81 MG tablet Take 81 mg by mouth daily.    Marland Kitchen  conjugated estrogens (PREMARIN) vaginal cream Place vaginally daily. 42.5 g 12  . diazepam (VALIUM) 5 MG tablet Take 1 tablet (5 mg total) by mouth every 12 (twelve) hours as needed. 45 tablet 0  . Fluticasone-Salmeterol (ADVAIR DISKUS) 250-50 MCG/DOSE AEPB Inhale 1 puff into the lungs 2 (two) times daily. 1 each 6  . ipratropium (ATROVENT) 0.03 % nasal spray 2 sprays into each nostril q12h prn 30 mL 1  . omeprazole (PRILOSEC) 20 MG capsule Take 20 mg by mouth daily.      . RESTASIS 0.05 % ophthalmic emulsion Apply 1 drop to eye 2 (two) times daily.  4  . roflumilast (DALIRESP) 500 MCG TABS tablet Take 1 tablet by mouth  daily 90 tablet 1  . SPIRIVA HANDIHALER 18 MCG inhalation capsule INHALE 1 CAPSULE VIA HANDIHALER EVERY DAY AT THE SAME TIME 30 capsule 6  . SYNTHROID 88 MCG tablet TAKE 1 TABLET BY MOUTH DAILY 30 tablet 6  .  venlafaxine (EFFEXOR) 50 MG tablet Take 1 tablet (50 mg total) by mouth 2 (two) times daily. 60 tablet 3  . Vitamin D, Ergocalciferol, (DRISDOL) 50000 UNITS CAPS capsule Take 50,000 Units by mouth every 7 (seven) days.    Marland Kitchen loratadine (CLARITIN) 10 MG tablet Take 10 mg by mouth as needed.       No facility-administered medications prior to visit.    Allergies  Allergen Reactions  . Losartan Other (See Comments)    hyperkalemia  . Penicillins Hives, Swelling and Rash  . Cefixime [Kdc:Cefixime] Other (See Comments)    unspecified  . Indocin [Indomethacin] Other (See Comments)    Painful tongue and throat  . Sulfa Antibiotics Other (See Comments)    unspecified  . Ace Inhibitors Other (See Comments)    cough  . Statins Other (See Comments)    Myalgias     ROS As per HPI  PE: Blood pressure 116/73, pulse 72, temperature 97.4 F (36.3 C), temperature source Oral, resp. rate 16, height 5' (1.524 m), weight 143 lb (64.864 kg), SpO2 92 %.Room air VS: noted--normal. Gen: alert, NAD, NONTOXIC APPEARING. HEENT: eyes without injection, drainage, or swelling.  Ears: EACs clear, TMs with normal light reflex and landmarks.  Nose: Scant clear rhinorrhea, with some dried, crusty exudate adherent to pink, non-edematous mucosa.  No purulent d/c.  No paranasal sinus TTP.  No facial swelling.  Throat and mouth without focal lesion.  No pharyngial swelling, erythema, or exudate.   Neck: supple, no LAD.   LUNGS: CTA bilat, nonlabored resps.  Prolonged exp phase noted on forced expiration, but no wheezing or post-exhalation coughing. CV: RRR, no m/r/g. EXT: no c/c/e SKIN: no rash  LABS:  none  IMPRESSION AND PLAN:  1) URI vs allergic rhinitis, with mild/early COPD flare. Zyrtec 10 mg qd. Depo-medrol 80 mg IM today.   Z-pack. Mucinex DM OTC recommended. Flu vaccine today.  An After Visit Summary was printed and given to the patient.  FOLLOW UP: Return if symptoms worsen or fail to  improve.

## 2014-10-11 NOTE — Progress Notes (Signed)
Pre visit review using our clinic review tool, if applicable. No additional management support is needed unless otherwise documented below in the visit note. 

## 2014-10-11 NOTE — Addendum Note (Signed)
Addended by: Lanae Crumbly on: 10/11/2014 04:02 PM   Modules accepted: Orders

## 2014-10-24 ENCOUNTER — Other Ambulatory Visit (HOSPITAL_COMMUNITY): Payer: Self-pay | Admitting: *Deleted

## 2014-10-24 NOTE — Patient Instructions (Addendum)
Priscilla Cantrell  10/24/2014   Your procedure is scheduled on: Wednesday September 28th, 2016  Report to [facility] Main  Entrance take [place]  elevators to 3rd floor to  [place] at 530 AM.  Call this number if you have problems the morning of surgery [phone]   Remember: ONLY 1 PERSON MAY GO WITH YOU TO SHORT STAY TO GET  READY MORNING OF [place].  Do not eat food or drink liquids :After Midnight.     Take these medicines the morning of surgery with A SIP OF WATER: Amlodipine (Norvasc), Certrizine (Zyrtec), Advair, Atrivent Nasal Spray if needed, Omeprazole (Prilosec), Restasis eye drop, Spiriva Handihaler, Synthroid, Venlafaxine (Effexor), Roflumilast (Daliresp)                               You may not have any metal on your body including hair pins and              piercings  Do not wear jewelry, make-up, lotions, powders or perfumes, deodorant             Do not wear nail polish.  Do not shave  48 hours prior to surgery.              Men may shave face and neck.   Do not bring valuables to the hospital. [place].  Contacts, dentures or bridgework may not be worn into surgery.  Leave suitcase in the car. After surgery it may be brought to your room.     Patients discharged the day of surgery will not be allowed to drive home.  Name and phone number of your driver:  Special Instructions: N/A              Please read over the following fact sheets you were given: _____________________________________________________________________             [facility] - Preparing for Surgery Before surgery, you can play an important role.  Because skin is not sterile, your skin needs to be as free of germs as possible.  You can reduce the number of germs on your skin by washing with CHG (chlorahexidine gluconate) soap before surgery.  CHG is an antiseptic cleaner which kills germs and bonds with the  skin to continue killing germs even after washing. Please DO NOT use if you have an allergy to CHG or antibacterial soaps.  If your skin becomes reddened/irritated stop using the CHG and inform your nurse when you arrive at Short Stay. Do not shave (including legs and underarms) for at least 48 hours prior to the first CHG shower.  You may shave your face/neck. Please follow these instructions carefully:  1.  Shower with CHG Soap the night before surgery and the  morning of Surgery.  2.  If you choose to wash your hair, wash your hair first as usual with your  normal  shampoo.  3.  After you shampoo, rinse your hair and body thoroughly to remove the  shampoo.                           4.  Use CHG as you would any other liquid soap.  You  can apply chg directly  to the skin and wash                       Gently with a scrungie or clean washcloth.  5.  Apply the CHG Soap to your body ONLY FROM THE NECK DOWN.   Do not use on face/ open                           Wound or open sores. Avoid contact with eyes, ears mouth and genitals (private parts).                       Wash face,  Genitals (private parts) with your normal soap.             6.  Wash thoroughly, paying special attention to the area where your surgery  will be performed.  7.  Thoroughly rinse your body with warm water from the neck down.  8.  DO NOT shower/wash with your normal soap after using and rinsing off  the CHG Soap.                9.  Pat yourself dry with a clean towel.            10.  Wear clean pajamas.            11.  Place clean sheets on your bed the night of your first shower and do not  sleep with pets. Day of Surgery : Do not apply any lotions/deodorants the morning of surgery.  Please wear clean clothes to the hospital/surgery center.  FAILURE TO FOLLOW THESE INSTRUCTIONS MAY RESULT IN THE CANCELLATION OF YOUR SURGERY PATIENT SIGNATURE_________________________________  NURSE  SIGNATURE__________________________________  ________________________________________________________________________   Priscilla Cantrell  An incentive spirometer is a tool that can help keep your lungs clear and active. This tool measures how well you are filling your lungs with each breath. Taking long deep breaths may help reverse or decrease the chance of developing breathing (pulmonary) problems (especially infection) following:  A long period of time when you are unable to move or be active. BEFORE THE PROCEDURE   If the spirometer includes an indicator to show your best effort, your nurse or respiratory therapist will set it to a desired goal.  If possible, sit up straight or lean slightly forward. Try not to slouch.  Hold the incentive spirometer in an upright position. INSTRUCTIONS FOR USE   Sit on the edge of your bed if possible, or sit up as far as you can in bed or on a chair.  Hold the incentive spirometer in an upright position.  Breathe out normally.  Place the mouthpiece in your mouth and seal your lips tightly around it.  Breathe in slowly and as deeply as possible, raising the piston or the ball toward the top of the column.  Hold your breath for 3-5 seconds or for as long as possible. Allow the piston or ball to fall to the bottom of the column.  Remove the mouthpiece from your mouth and breathe out normally.  Rest for a few seconds and repeat Steps 1 through 7 at least 10 times every 1-2 hours when you are awake. Take your time and take a few normal breaths between deep breaths.  The spirometer may include an indicator to show your best effort. Use the indicator as a goal to work toward during  each repetition.  After each set of 10 deep breaths, practice coughing to be sure your lungs are clear. If you have an incision (the cut made at the time of surgery), support your incision when coughing by placing a pillow or rolled up towels firmly against it. Once  you are able to get out of bed, walk around indoors and cough well. You may stop using the incentive spirometer when instructed by your caregiver.  RISKS AND COMPLICATIONS  Take your time so you do not get dizzy or light-headed.  If you are in pain, you may need to take or ask for pain medication before doing incentive spirometry. It is harder to take a deep breath if you are having pain. AFTER USE  Rest and breathe slowly and easily.  It can be helpful to keep track of a log of your progress. Your caregiver can provide you with a simple table to help with this. If you are using the spirometer at home, follow these instructions: [place] IF:   You are having difficultly using the spirometer.  You have trouble using the spirometer as often as instructed.  Your pain medication is not giving enough relief while using the spirometer.  You develop fever of 100.5 F (38.1 C) or higher. SEEK IMMEDIATE MEDICAL CARE IF:   You cough up bloody sputum that had not been present before.  You develop fever of 102 F (38.9 C) or greater.  You develop worsening pain at or near the incision site. MAKE SURE YOU:   Understand these instructions.  Will watch your condition.  Will get help right away if you are not doing well or get worse. Document Released: 06/02/2006 Document Revised: 04/14/2011 Document Reviewed: 08/03/2006 Exit[employer] Patient Information 2014 Exit[employer], [place].   ________________________________________________________________________  WHAT IS A BLOOD TRANSFUSION? Blood Transfusion Information  A transfusion is the replacement of blood or some of its parts. Blood is made up of multiple cells which provide different functions.  Red blood cells carry oxygen and are used for blood loss replacement.  White blood cells fight against infection.  Platelets control bleeding.  Plasma helps clot blood.  Other blood products are available for specialized needs, such as  hemophilia or other clotting disorders. BEFORE THE TRANSFUSION  Who gives blood for transfusions?   Healthy volunteers who are fully evaluated to make sure their blood is safe. This is blood bank blood. Transfusion therapy is the safest it has ever been in the practice of medicine. Before blood is taken from a donor, a complete history is taken to make sure that person has no history of diseases nor engages in risky social behavior (examples are intravenous drug use or sexual activity with multiple partners). The donor's travel history is screened to minimize risk of transmitting infections, such as malaria. The donated blood is tested for signs of infectious diseases, such as HIV and hepatitis. The blood is then tested to be sure it is compatible with you in order to minimize the chance of a transfusion reaction. If you or a relative donates blood, this is often done in anticipation of surgery and is not appropriate for emergency situations. It takes many days to process the donated blood. RISKS AND COMPLICATIONS Although transfusion therapy is very safe and saves many lives, the main dangers of transfusion include:   Getting an infectious disease.  Developing a transfusion reaction. This is an allergic reaction to something in the blood you were given. Every precaution is taken to prevent  this. The decision to have a blood transfusion has been considered carefully by your caregiver before blood is given. Blood is not given unless the benefits outweigh the risks. AFTER THE TRANSFUSION  Right after receiving a blood transfusion, you will usually feel much better and more energetic. This is especially true if your red blood cells have gotten low (anemic). The transfusion raises the level of the red blood cells which carry oxygen, and this usually causes an energy increase.  The nurse administering the transfusion will monitor you carefully for complications. HOME CARE INSTRUCTIONS  No special  instructions are needed after a transfusion. You may find your energy is better. Speak with your caregiver about any limitations on activity for underlying diseases you may have. SEEK MEDICAL CARE IF:   Your condition is not improving after your transfusion.  You develop redness or irritation at the intravenous (IV) site. SEEK IMMEDIATE MEDICAL CARE IF:  Any of the following symptoms occur over the next 12 hours:  Shaking chills.  You have a temperature by mouth above 102 F (38.9 C), not controlled by medicine.  Chest, back, or muscle pain.  People around you feel you are not acting correctly or are confused.  Shortness of breath or difficulty breathing.  Dizziness and fainting.  You get a rash or develop hives.  You have a decrease in urine output.  Your urine turns a dark color or changes to pink, red, or brown. Any of the following symptoms occur over the next 10 days:  You have a temperature by mouth above 102 F (38.9 C), not controlled by medicine.  Shortness of breath.  Weakness after normal activity.  The white part of the eye turns yellow (jaundice).  You have a decrease in the amount of urine or are urinating less often.  Your urine turns a dark color or changes to pink, red, or brown. Document Released: 01/18/2000 Document Revised: 04/14/2011 Document Reviewed: 09/06/2007 [facility] Patient Information 2014 [place], [place].  _______________________________________________________________________

## 2014-10-24 NOTE — Progress Notes (Signed)
[facility]cho 08-29-14 epic lov dr Anitra Lauth 09-06-14 epic pulmonary clearance note dr [name] 09-18-14 epic

## 2014-10-26 ENCOUNTER — Encounter (HOSPITAL_COMMUNITY): Payer: Self-pay

## 2014-10-26 ENCOUNTER — Encounter (HOSPITAL_COMMUNITY)
Admission: RE | Admit: 2014-10-26 | Discharge: 2014-10-26 | Disposition: A | Discharge status: Home / Self Care | Payer: Medicare Other | Source: Ambulatory Visit | Attending: Orthopedic Surgery | Admitting: Orthopedic Surgery

## 2014-10-26 ENCOUNTER — Ambulatory Visit (HOSPITAL_COMMUNITY)
Admission: RE | Admit: 2014-10-26 | Discharge: 2014-10-26 | Disposition: A | Discharge status: Home / Self Care | Payer: Medicare Other | Source: Ambulatory Visit | Attending: Surgical | Admitting: Surgical

## 2014-10-26 DIAGNOSIS — Z01812 Encounter for preprocedural laboratory examination: Secondary | ICD-10-CM | POA: Diagnosis not present

## 2014-10-26 DIAGNOSIS — I700 Atherosclerosis of aorta: Secondary | ICD-10-CM | POA: Diagnosis not present

## 2014-10-26 DIAGNOSIS — J449 Chronic obstructive pulmonary disease, unspecified: Secondary | ICD-10-CM | POA: Diagnosis not present

## 2014-10-26 DIAGNOSIS — M1611 Unilateral primary osteoarthritis, right hip: Secondary | ICD-10-CM | POA: Diagnosis not present

## 2014-10-26 DIAGNOSIS — J439 Emphysema, unspecified: Secondary | ICD-10-CM | POA: Diagnosis not present

## 2014-10-26 DIAGNOSIS — Z79899 Other long term (current) drug therapy: Secondary | ICD-10-CM | POA: Diagnosis not present

## 2014-10-26 DIAGNOSIS — Z01818 Encounter for other preprocedural examination: Secondary | ICD-10-CM

## 2014-10-26 DIAGNOSIS — Z0183 Encounter for blood typing: Secondary | ICD-10-CM | POA: Diagnosis not present

## 2014-10-26 DIAGNOSIS — J45909 Unspecified asthma, uncomplicated: Secondary | ICD-10-CM | POA: Insufficient documentation

## 2014-10-26 HISTORY — DX: Family history of other specified conditions: Z84.89

## 2014-10-26 HISTORY — DX: Dependence on supplemental oxygen: Z99.81

## 2014-10-26 LAB — CBC WITH DIFFERENTIAL/PLATELET
Basophils Absolute: 0 10*3/uL (ref 0.0–0.1)
Basophils Relative: 0 %
Eosinophils Absolute: 0.2 10*3/uL (ref 0.0–0.7)
Eosinophils Relative: 2 %
HCT: 39.7 % (ref 36.0–46.0)
Hemoglobin: 12.6 g/dL (ref 12.0–15.0)
Lymphocytes Relative: 18 %
Lymphs Abs: 1.9 10*3/uL (ref 0.7–4.0)
MCH: 26 pg (ref 26.0–34.0)
MCHC: 31.7 g/dL (ref 30.0–36.0)
MCV: 81.9 fL (ref 78.0–100.0)
Monocytes Absolute: 1 10*3/uL (ref 0.1–1.0)
Monocytes Relative: 9 %
Neutro Abs: 7.7 10*3/uL (ref 1.7–7.7)
Neutrophils Relative %: 71 %
Platelets: 350 10*3/uL (ref 150–400)
RBC: 4.85 MIL/uL (ref 3.87–5.11)
RDW: 15.5 % (ref 11.5–15.5)
WBC: 10.7 10*3/uL — ABNORMAL HIGH (ref 4.0–10.5)

## 2014-10-26 LAB — COMPREHENSIVE METABOLIC PANEL
ALT: 14 U/L (ref 14–54)
AST: 19 U/L (ref 15–41)
Albumin: 4.1 g/dL (ref 3.5–5.0)
Alkaline Phosphatase: 98 U/L (ref 38–126)
Anion gap: 9 (ref 5–15)
BUN: 14 mg/dL (ref 6–20)
CO2: 26 mmol/L (ref 22–32)
Calcium: 9 mg/dL (ref 8.9–10.3)
Chloride: 105 mmol/L (ref 101–111)
Creatinine, Ser: 0.73 mg/dL (ref 0.44–1.00)
GFR calc Af Amer: >60 mL/min (ref >60)
GFR calc non Af Amer: >60 mL/min (ref >60)
Glucose, Bld: 90 mg/dL (ref 65–99)
Potassium: 4.1 mmol/L (ref 3.5–5.1)
Sodium: 140 mmol/L (ref 135–145)
Total Bilirubin: 0.4 mg/dL (ref 0.3–1.2)
Total Protein: 7.4 g/dL (ref 6.5–8.1)

## 2014-10-26 LAB — PROTIME-INR
INR: 0.92 (ref 0.00–1.49)
Prothrombin Time: 12.6 seconds (ref 11.6–15.2)

## 2014-10-26 LAB — URINALYSIS, ROUTINE W REFLEX MICROSCOPIC
Bilirubin Urine: NEGATIVE
Glucose, UA: NEGATIVE mg/dL
Hgb urine dipstick: NEGATIVE
Ketones, ur: NEGATIVE mg/dL
Nitrite: NEGATIVE
Protein, ur: NEGATIVE mg/dL
Specific Gravity, Urine: 1.006 (ref 1.005–1.030)
Urobilinogen, UA: 0.2 mg/dL (ref 0.0–1.0)
pH: 6 (ref 5.0–8.0)

## 2014-10-26 LAB — URINE MICROSCOPIC-ADD ON

## 2014-10-26 LAB — SURGICAL PCR SCREEN
MRSA, PCR: NEGATIVE
Staphylococcus aureus: NEGATIVE

## 2014-10-26 LAB — ABO/RH: ABO/RH(D): O POS

## 2014-10-26 LAB — APTT: aPTT: 29 seconds (ref 24–37)

## 2014-10-27 NOTE — Progress Notes (Addendum)
micro and ua results faxed by epic to dr Gladstone Lighter office.

## 2014-10-31 NOTE — H&P (Signed)
TOTAL HIP ADMISSION H&P  Patient is admitted for right total hip arthroplasty.  Subjective:  Chief Complaint: right hip pain  HPI: Priscilla Cantrell, 79 y.o. female, has a history of pain and functional disability in the right hip(s) due to arthritis and patient has failed non-surgical conservative treatments for greater than 12 weeks to include NSAID's and/or analgesics, use of assistive devices and activity modification.  Onset of symptoms was gradual starting 3 years ago with gradually worsening course since that time.The patient noted no past surgery on the right hip(s).  Patient currently rates pain in the right hip at 7 out of 10 with activity. Patient has night pain, worsening of pain with activity and weight bearing, pain that interfers with activities of daily living, pain with passive range of motion and crepitus. Patient has evidence of periarticular osteophytes and joint space narrowing by imaging studies. This condition presents safety issues increasing the risk of falls.  There is no current active infection.  Patient Active Problem List   Diagnosis Date Noted  . Spinal stenosis, lumbar region, with neurogenic claudication 01/04/2014  . Abdominal pain, epigastric 10/17/2013  . Osteoarthritis, hip, bilateral 07/27/2013  . Sciatica associated with disorder of lumbosacral spine 04/19/2013  . Plantar wart of right foot 04/19/2013  . Atrophic vaginitis 01/12/2012  . Periodic limb movement disorder 11/24/2011  . Hypoxia 05/22/2011  . Depression with anxiety 04/15/2011  . Chronic venous insufficiency 07/29/2010  . Rotator cuff strain 07/29/2010  . Low back pain 07/29/2010  . Chest pain 05/09/2010  . LEG PAIN, BILATERAL 03/07/2010  . Hypothyroidism 01/07/2010  . Hypertension 01/07/2010  . SPINAL STENOSIS 01/07/2010  . INSOMNIA, HX OF 01/07/2010  . COPD with chronic bronchitis 08/09/2007   Past Medical History  Diagnosis Date  . Hypertension     EKG 03/2010 normal  . COPD  (chronic obstructive pulmonary disease)     spirometry 01/10/09 FEV 0.97(52%), FEV1% 47  . Hypoxemia     O2 86% RA with exertion 01/10/09, 2 liters oxygen with exertion and sleep  . Pulmonary nodule     CT chest 12/10, June 2011.  No nodule on CT 06/2010.  Marland Kitchen Spinal stenosis     Symptomatic (neurogenic claudication)  . GERD (gastroesophageal reflux disease)   . Hypothyroidism     Hashimoto's, dx'd 1989  . Diverticulosis of colon     Procto '97 and colonoscopy 2002  . DDD (degenerative disc disease), cervical     1998 MRI C5-6 impingemt (left)  . DDD (degenerative disc disease), lumbar   . Spinal stenosis of lumbar region with neurogenic claudication     2008 MRI (L3-4, L4-5, L5-S1)  . Fibrocystic breast disease     w/fibroadenoma.  Bx's showed NO atypia (Dr. Margot Chimes).  Mammo neg 2008.  Marland Kitchen Chest pain, non-cardiac     Cardiac CT showed no signif obstructive dz, mildly elevated calcium level (Dr. [name]).  . History of double vision     Ophthalmologist, Dr. Herbert Deaner, is further evaluating this with MRI  orbits and limited brain (myesthenia gravis testing neg 07/2010)  . Depression with anxiety 04/15/2011  . Asthma   . Dysplastic nevus of upper extremity 04/2014    R tricep ([name])  . Osteoarthritis, hip, bilateral     End stage; plan for R THA w/[facility] as of 09/22/14  . History of home oxygen therapy     uses 2 liters at hs  . Family history of adverse reaction to anesthesia  mother died when patient age 89 receiving anesthesia for thyroid surgery    Past Surgical History  Procedure Laterality Date  . Cholecystectomy  2001  . Tubal ligation  1974  . Abdominal hysterectomy  1978    no BSO per pt--nonmalignant reasons  . Breast cyst excision      Last screening mammogram 10/2010 was normal.  . Appendectomy    . Tonsillectomy    . Colonoscopy  04/2005    NORMAL-recall 10 yrs  . Transthoracic echocardiogram  08/2014    EF 60-65%, grade I diast dysfxn     Current outpatient  prescriptions:  .  albuterol (PROAIR HFA) 108 (90 BASE) MCG/ACT inhaler, Inhale 2 puffs into the lungs every 4 (four) hours as needed. (Patient taking differently: Inhale 2 puffs into the lungs every 4 (four) hours as needed for wheezing or shortness of breath. ), Disp: 1 Inhaler, Rfl: 5 .  amLODipine (NORVASC) 5 MG tablet, Take 1 tablet (5 mg total) by mouth daily., Disp: 90 tablet, Rfl: 3 .  aspirin 81 MG tablet, Take 81 mg by mouth daily., Disp: , Rfl:  .  cetirizine (ZYRTEC) 10 MG tablet, Take 1 tablet (10 mg total) by mouth daily., Disp: 30 tablet, Rfl: 11 .  conjugated estrogens (PREMARIN) vaginal cream, Place vaginally daily. (Patient taking differently: Place 1 Applicatorful vaginally daily as needed (itching). ), Disp: 42.5 g, Rfl: 12 .  diazepam (VALIUM) 5 MG tablet, Take 1 tablet (5 mg total) by mouth every 12 (twelve) hours as needed. (Patient taking differently: Take 5 mg by mouth every 12 (twelve) hours as needed for anxiety. ), Disp: 45 tablet, Rfl: 0 .  Fluticasone-Salmeterol (ADVAIR DISKUS) 250-50 MCG/DOSE AEPB, Inhale 1 puff into the lungs 2 (two) times daily., Disp: 1 each, Rfl: 6 .  ipratropium (ATROVENT) 0.03 % nasal spray, 2 sprays into each nostril q12h prn (Patient taking differently: Place 2 sprays into both nostrils 2 (two) times daily as needed for rhinitis. ), Disp: 30 mL, Rfl: 1 .  omeprazole (PRILOSEC) 20 MG capsule, Take 20 mg by mouth daily.  , Disp: , Rfl:  .  RESTASIS 0.05 % ophthalmic emulsion, Apply 1 drop to eye 2 (two) times daily., Disp: , Rfl: 4 .  roflumilast (DALIRESP) 500 MCG TABS tablet, Take 1 tablet by mouth  daily, Disp: 90 tablet, Rfl: 1 .  SPIRIVA HANDIHALER 18 MCG inhalation capsule, INHALE 1 CAPSULE VIA HANDIHALER EVERY DAY AT THE SAME TIME, Disp: 30 capsule, Rfl: 6 .  SYNTHROID 88 MCG tablet, TAKE 1 TABLET BY MOUTH DAILY, Disp: 30 tablet, Rfl: 6 .  venlafaxine (EFFEXOR) 50 MG tablet, Take 1 tablet (50 mg total) by mouth 2 (two) times daily. (Patient  taking differently: Take 50 mg by mouth 2 (two) times daily. Takes 1 tab am, 1/2 tab pm), Disp: 60 tablet, Rfl: 3 .  Vitamin D, Ergocalciferol, (DRISDOL) 50000 UNITS CAPS capsule, Take 50,000 Units by mouth every 7 (seven) days., Disp: , Rfl:    Allergies  Allergen Reactions  . Losartan Other (See Comments)    hyperkalemia  . Penicillins Hives, Swelling and Rash    Has patient had a PCN reaction causing immediate rash, facial/tongue/throat swelling, SOB or lightheadedness with hypotension: YES Has patient had a PCN reaction causing severe rash involving mucus membranes or skin necrosis: NO Has patient had a PCN reaction that required hospitalization NO Has patient had a PCN reaction occurring within the last 10 years: NO If all of the above answers  are "NO", then may proceed with Cephalosporin use.   . Cefixime [Kdc:Cefixime] Other (See Comments)    unspecified  . Indocin [Indomethacin] Other (See Comments)    Painful tongue and throat  . Sulfa Antibiotics Other (See Comments)    unspecified  . Ace Inhibitors Other (See Comments)    cough  . Statins Other (See Comments)    Myalgias     Social History  Substance Use Topics  . Smoking status: Former Smoker -- 2.00 packs/day for 35 years    Types: Cigarettes    Quit date: 02/03/1990  . Smokeless tobacco: Never Used  . Alcohol Use: Yes     Comment: Rarely    Family History  Problem Relation Age of Onset  . Goiter Mother   . Psoriasis Father     od liver  . Diabetes Daughter     Type 2  . Cancer Paternal Aunt     stomach?  . Stroke Maternal Grandfather   . Stroke Paternal Grandmother   . Hypertension Paternal Grandmother   . Hernia Paternal Grandmother   . Cancer Paternal Grandfather     lung  . Cirrhosis Father      Review of Systems  Constitutional: Negative.   HENT: Negative.   Eyes: Negative.   Respiratory: Positive for shortness of breath. Negative for cough, hemoptysis, sputum production and wheezing.         SOB with exertion  Cardiovascular: Negative.   Gastrointestinal: Negative.   Genitourinary: Negative.   Musculoskeletal: Positive for myalgias, back pain and joint pain. Negative for falls and neck pain.       Right hip pain  Skin: Negative.   Neurological: Negative.   Endo/Heme/Allergies: Negative.   Psychiatric/Behavioral: Negative.     Objective:  Physical Exam  Constitutional: She is oriented to person, place, and time. She appears well-developed and well-nourished. No distress.  HENT:  Head: Normocephalic and atraumatic.  Right Ear: External ear normal.  Left Ear: External ear normal.  Nose: Nose normal.  Mouth/Throat: Oropharynx is clear and moist.  Eyes: Conjunctivae and EOM are normal.  Neck: Normal range of motion. Neck supple.  Cardiovascular: Normal rate, regular rhythm, normal heart sounds and intact distal pulses.   No murmur heard. Respiratory: Effort normal and breath sounds normal. No respiratory distress. She has no wheezes.  GI: Soft. Bowel sounds are normal. She exhibits no distension. There is no tenderness.  Musculoskeletal:       Right hip: She exhibits decreased range of motion, decreased strength and crepitus.       Left hip: Normal.       Right knee: Normal.       Left knee: Normal.  Neurological: She is alert and oriented to person, place, and time. She has normal strength and normal reflexes. No sensory deficit.  Skin: No rash noted. She is not diaphoretic. No erythema.  Psychiatric: She has a normal mood and affect. Her behavior is normal.   Vitals  Weight: 140 lb Height: 61in Body Surface Area: 1.62 m Body Mass Index: 26.45 kg/m  Pulse: 76 (Regular)  BP: 150/82 (Sitting, Right Arm, Standard)   Imaging Review Plain radiographs demonstrate severe degenerative joint disease of the right hip(s). The bone quality appears to be good for age and reported activity level.  Assessment/Plan:  End stage primary osteoarthritis, right  hip(s)  The patient history, physical examination, clinical judgement of the provider and imaging studies are consistent with end stage degenerative joint disease of the  right hip(s) and total hip arthroplasty is deemed medically necessary. The treatment options including medical management, injection therapy, arthroscopy and arthroplasty were discussed at length. The risks and benefits of total hip arthroplasty were presented and reviewed. The risks due to aseptic loosening, infection, stiffness, dislocation/subluxation,  thromboembolic complications and other imponderables were discussed.  The patient acknowledged the explanation, agreed to proceed with the plan and consent was signed. Patient is being admitted for inpatient treatment for surgery, pain control, PT, OT, prophylactic antibiotics, VTE prophylaxis, progressive ambulation and ADL's and discharge planning.The patient is planning to be discharged home with home health services    PCP: Dr. Anitra Lauth Cardio: Dr. [name] Pulm: [name]  TXA IV   Ardeen Jourdain, PA-C

## 2014-11-01 ENCOUNTER — Inpatient Hospital Stay (HOSPITAL_COMMUNITY)
Admission: RE | Admit: 2014-11-01 | Discharge: 2014-11-04 | DRG: 470 | Disposition: A | Discharge status: Skilled Nursing Facility | Payer: Medicare Other | Source: Ambulatory Visit | Attending: Orthopedic Surgery | Admitting: Orthopedic Surgery

## 2014-11-01 ENCOUNTER — Encounter (HOSPITAL_COMMUNITY)
Admission: RE | Disposition: A | Discharge status: Skilled Nursing Facility | Payer: Self-pay | Source: Ambulatory Visit | Attending: Orthopedic Surgery

## 2014-11-01 ENCOUNTER — Inpatient Hospital Stay (HOSPITAL_COMMUNITY): Payer: Medicare Other

## 2014-11-01 ENCOUNTER — Encounter (HOSPITAL_COMMUNITY): Payer: Self-pay | Admitting: *Deleted

## 2014-11-01 ENCOUNTER — Inpatient Hospital Stay (HOSPITAL_COMMUNITY): Payer: Medicare Other | Admitting: Registered Nurse

## 2014-11-01 DIAGNOSIS — Z96649 Presence of unspecified artificial hip joint: Secondary | ICD-10-CM

## 2014-11-01 DIAGNOSIS — M1611 Unilateral primary osteoarthritis, right hip: Principal | ICD-10-CM | POA: Diagnosis present

## 2014-11-01 DIAGNOSIS — J449 Chronic obstructive pulmonary disease, unspecified: Secondary | ICD-10-CM | POA: Diagnosis not present

## 2014-11-01 DIAGNOSIS — R278 Other lack of coordination: Secondary | ICD-10-CM | POA: Diagnosis not present

## 2014-11-01 DIAGNOSIS — E039 Hypothyroidism, unspecified: Secondary | ICD-10-CM | POA: Diagnosis present

## 2014-11-01 DIAGNOSIS — I10 Essential (primary) hypertension: Secondary | ICD-10-CM | POA: Diagnosis present

## 2014-11-01 DIAGNOSIS — M6281 Muscle weakness (generalized): Secondary | ICD-10-CM | POA: Diagnosis not present

## 2014-11-01 DIAGNOSIS — R2681 Unsteadiness on feet: Secondary | ICD-10-CM | POA: Diagnosis not present

## 2014-11-01 DIAGNOSIS — Z880 Allergy status to penicillin: Secondary | ICD-10-CM | POA: Diagnosis not present

## 2014-11-01 DIAGNOSIS — M25551 Pain in right hip: Secondary | ICD-10-CM | POA: Diagnosis not present

## 2014-11-01 DIAGNOSIS — M4806 Spinal stenosis, lumbar region: Secondary | ICD-10-CM | POA: Diagnosis present

## 2014-11-01 DIAGNOSIS — Z9889 Other specified postprocedural states: Secondary | ICD-10-CM

## 2014-11-01 DIAGNOSIS — Z79899 Other long term (current) drug therapy: Secondary | ICD-10-CM

## 2014-11-01 DIAGNOSIS — Z87891 Personal history of nicotine dependence: Secondary | ICD-10-CM

## 2014-11-01 DIAGNOSIS — Z96641 Presence of right artificial hip joint: Secondary | ICD-10-CM | POA: Diagnosis not present

## 2014-11-01 DIAGNOSIS — Z7982 Long term (current) use of aspirin: Secondary | ICD-10-CM | POA: Diagnosis not present

## 2014-11-01 DIAGNOSIS — M21251 Flexion deformity, right hip: Secondary | ICD-10-CM | POA: Diagnosis not present

## 2014-11-01 DIAGNOSIS — I739 Peripheral vascular disease, unspecified: Secondary | ICD-10-CM | POA: Diagnosis not present

## 2014-11-01 DIAGNOSIS — K219 Gastro-esophageal reflux disease without esophagitis: Secondary | ICD-10-CM | POA: Diagnosis present

## 2014-11-01 DIAGNOSIS — Z471 Aftercare following joint replacement surgery: Secondary | ICD-10-CM | POA: Diagnosis not present

## 2014-11-01 HISTORY — PX: TOTAL HIP ARTHROPLASTY: SHX124

## 2014-11-01 LAB — TYPE AND SCREEN
ABO/RH(D): O POS
Antibody Screen: NEGATIVE

## 2014-11-01 SURGERY — ARTHROPLASTY, HIP, TOTAL,POSTERIOR APPROACH
Anesthesia: General | Site: Hip | Laterality: Right

## 2014-11-01 MED ORDER — IPRATROPIUM BROMIDE 0.03 % NA SOLN: 2.0000 | Freq: Two times a day (BID) | NASAL | Status: DC | PRN

## 2014-11-01 MED ORDER — VENLAFAXINE HCL 50 MG PO TABS
50.0000 mg | ORAL_TABLET | Freq: Two times a day (BID) | ORAL | Status: DC
  Administered 2014-11-01 – 2014-11-04 (×7): 50 mg via ORAL
  Filled 2014-11-01 (×8): qty 1

## 2014-11-01 MED ORDER — METOCLOPRAMIDE HCL 5 MG/ML IJ SOLN: 5.0000 mg | Freq: Three times a day (TID) | INTRAMUSCULAR | Status: DC | PRN

## 2014-11-01 MED ORDER — PHENOL 1.4 % MT LIQD: 1.0000 | OROMUCOSAL | Status: DC | PRN

## 2014-11-01 MED ORDER — AMLODIPINE BESYLATE 5 MG PO TABS
5.0000 mg | ORAL_TABLET | Freq: Every day | ORAL | Status: DC
  Administered 2014-11-02 – 2014-11-04 (×3): 5 mg via ORAL
  Filled 2014-11-01 (×3): qty 1

## 2014-11-01 MED ORDER — SUCCINYLCHOLINE CHLORIDE 20 MG/ML IJ SOLN
INTRAMUSCULAR | Status: DC | PRN
  Administered 2014-11-01: 100 mg via INTRAVENOUS

## 2014-11-01 MED ORDER — ONDANSETRON HCL 4 MG PO TABS: 4.0000 mg | ORAL_TABLET | Freq: Four times a day (QID) | ORAL | Status: DC | PRN

## 2014-11-01 MED ORDER — ONDANSETRON HCL 4 MG/2ML IJ SOLN
INTRAMUSCULAR | Status: DC | PRN
  Administered 2014-11-01: 4 mg via INTRAVENOUS

## 2014-11-01 MED ORDER — TIOTROPIUM BROMIDE MONOHYDRATE 18 MCG IN CAPS
18.0000 ug | ORAL_CAPSULE | Freq: Every day | RESPIRATORY_TRACT | Status: DC
  Administered 2014-11-02 – 2014-11-04 (×3): 18 ug via RESPIRATORY_TRACT
  Filled 2014-11-01: qty 5

## 2014-11-01 MED ORDER — DEXAMETHASONE SODIUM PHOSPHATE 10 MG/ML IJ SOLN
INTRAMUSCULAR | Status: AC
  Filled 2014-11-01: qty 1

## 2014-11-01 MED ORDER — LEVOTHYROXINE SODIUM 88 MCG PO TABS
88.0000 ug | ORAL_TABLET | Freq: Every day | ORAL | Status: DC
  Administered 2014-11-02 – 2014-11-04 (×3): 88 ug via ORAL
  Filled 2014-11-01 (×4): qty 1

## 2014-11-01 MED ORDER — PANTOPRAZOLE SODIUM 40 MG PO TBEC
40.0000 mg | DELAYED_RELEASE_TABLET | Freq: Every day | ORAL | Status: DC
  Administered 2014-11-02 – 2014-11-04 (×3): 40 mg via ORAL
  Filled 2014-11-01 (×3): qty 1

## 2014-11-01 MED ORDER — LIDOCAINE HCL (CARDIAC) 20 MG/ML IV SOLN
INTRAVENOUS | Status: AC
  Filled 2014-11-01: qty 5

## 2014-11-01 MED ORDER — ALUM & MAG HYDROXIDE-SIMETH 200-200-20 MG/5ML PO SUSP: 30.0000 mL | ORAL | Status: DC | PRN

## 2014-11-01 MED ORDER — VANCOMYCIN HCL IN DEXTROSE 1-5 GM/200ML-% IV SOLN
1000.0000 mg | Freq: Two times a day (BID) | INTRAVENOUS | Status: AC
  Administered 2014-11-01: 1000 mg via INTRAVENOUS
  Filled 2014-11-01: qty 200

## 2014-11-01 MED ORDER — ROCURONIUM BROMIDE 100 MG/10ML IV SOLN
INTRAVENOUS | Status: AC
  Filled 2014-11-01: qty 1

## 2014-11-01 MED ORDER — ACETAMINOPHEN 650 MG RE SUPP: 650.0000 mg | Freq: Four times a day (QID) | RECTAL | Status: DC | PRN

## 2014-11-01 MED ORDER — METOCLOPRAMIDE HCL 10 MG PO TABS: 5.0000 mg | ORAL_TABLET | Freq: Three times a day (TID) | ORAL | Status: DC | PRN

## 2014-11-01 MED ORDER — VANCOMYCIN HCL IN DEXTROSE 1-5 GM/200ML-% IV SOLN
1000.0000 mg | INTRAVENOUS | Status: AC
  Administered 2014-11-01: 1000 mg via INTRAVENOUS
  Filled 2014-11-01: qty 200

## 2014-11-01 MED ORDER — POLYETHYLENE GLYCOL 3350 17 G PO PACK
17.0000 g | PACK | Freq: Every day | ORAL | Status: DC | PRN
  Administered 2014-11-04: 17 g via ORAL
  Filled 2014-11-01: qty 1

## 2014-11-01 MED ORDER — SODIUM CHLORIDE 0.9 % IJ SOLN
INTRAMUSCULAR | Status: AC
  Filled 2014-11-01: qty 50

## 2014-11-01 MED ORDER — NEOSTIGMINE METHYLSULFATE 10 MG/10ML IV SOLN
INTRAVENOUS | Status: DC | PRN
  Administered 2014-11-01: 3 mg via INTRAVENOUS

## 2014-11-01 MED ORDER — LACTATED RINGERS IV SOLN
INTRAVENOUS | Status: DC
  Administered 2014-11-01: 100 mL/h via INTRAVENOUS
  Administered 2014-11-01: 11:00:00 via INTRAVENOUS

## 2014-11-01 MED ORDER — MENTHOL 3 MG MT LOZG: 1.0000 | LOZENGE | OROMUCOSAL | Status: DC | PRN

## 2014-11-01 MED ORDER — SODIUM CHLORIDE 0.9 % IR SOLN
Status: AC
  Filled 2014-11-01: qty 1

## 2014-11-01 MED ORDER — HYDROMORPHONE HCL 1 MG/ML IJ SOLN
0.2500 mg | INTRAMUSCULAR | Status: DC | PRN
  Administered 2014-11-01: 0.5 mg via INTRAVENOUS
  Administered 2014-11-01 (×2): 0.25 mg via INTRAVENOUS

## 2014-11-01 MED ORDER — PROPOFOL 10 MG/ML IV BOLUS
INTRAVENOUS | Status: AC
  Filled 2014-11-01: qty 20

## 2014-11-01 MED ORDER — ALBUTEROL SULFATE (2.5 MG/3ML) 0.083% IN NEBU: 2.5000 mg | INHALATION_SOLUTION | RESPIRATORY_TRACT | Status: DC | PRN

## 2014-11-01 MED ORDER — HYDROMORPHONE HCL 1 MG/ML IJ SOLN
INTRAMUSCULAR | Status: AC
  Filled 2014-11-01: qty 1

## 2014-11-01 MED ORDER — GLYCOPYRROLATE 0.2 MG/ML IJ SOLN
INTRAMUSCULAR | Status: DC | PRN
  Administered 2014-11-01: 0.4 mg via INTRAVENOUS

## 2014-11-01 MED ORDER — LIDOCAINE HCL (CARDIAC) 20 MG/ML IV SOLN
INTRAVENOUS | Status: DC | PRN
  Administered 2014-11-01: 50 mg via INTRAVENOUS

## 2014-11-01 MED ORDER — THROMBIN 5000 UNITS EX SOLR
CUTANEOUS | Status: DC | PRN
  Administered 2014-11-01: 5000 [IU] via TOPICAL

## 2014-11-01 MED ORDER — PROPOFOL 10 MG/ML IV BOLUS
INTRAVENOUS | Status: DC | PRN
  Administered 2014-11-01: 160 mg via INTRAVENOUS

## 2014-11-01 MED ORDER — ONDANSETRON HCL 4 MG/2ML IJ SOLN
INTRAMUSCULAR | Status: AC
  Filled 2014-11-01: qty 2

## 2014-11-01 MED ORDER — THROMBIN 5000 UNITS EX SOLR
CUTANEOUS | Status: AC
  Filled 2014-11-01: qty 5000

## 2014-11-01 MED ORDER — HYDROMORPHONE HCL 1 MG/ML IJ SOLN
1.0000 mg | INTRAMUSCULAR | Status: DC | PRN
  Administered 2014-11-01: 1 mg via INTRAVENOUS
  Filled 2014-11-01: qty 1

## 2014-11-01 MED ORDER — METHOCARBAMOL 1000 MG/10ML IJ SOLN
500.0000 mg | Freq: Four times a day (QID) | INTRAVENOUS | Status: DC | PRN
  Administered 2014-11-01: 500 mg via INTRAVENOUS
  Filled 2014-11-01 (×2): qty 5

## 2014-11-01 MED ORDER — ROCURONIUM BROMIDE 100 MG/10ML IV SOLN
INTRAVENOUS | Status: DC | PRN
  Administered 2014-11-01: 5 mg via INTRAVENOUS
  Administered 2014-11-01: 35 mg via INTRAVENOUS

## 2014-11-01 MED ORDER — ALBUTEROL SULFATE HFA 108 (90 BASE) MCG/ACT IN AERS: 2.0000 | INHALATION_SPRAY | RESPIRATORY_TRACT | Status: DC | PRN

## 2014-11-01 MED ORDER — DEXAMETHASONE SODIUM PHOSPHATE 10 MG/ML IJ SOLN
INTRAMUSCULAR | Status: DC | PRN
  Administered 2014-11-01: 10 mg via INTRAVENOUS

## 2014-11-01 MED ORDER — BISACODYL 5 MG PO TBEC
5.0000 mg | DELAYED_RELEASE_TABLET | Freq: Every day | ORAL | Status: DC | PRN
  Administered 2014-11-04: 5 mg via ORAL
  Filled 2014-11-01: qty 1

## 2014-11-01 MED ORDER — METHOCARBAMOL 500 MG PO TABS
500.0000 mg | ORAL_TABLET | Freq: Four times a day (QID) | ORAL | Status: DC | PRN
  Administered 2014-11-02 – 2014-11-04 (×2): 500 mg via ORAL
  Filled 2014-11-01: qty 1

## 2014-11-01 MED ORDER — ACETAMINOPHEN 325 MG PO TABS: 650.0000 mg | ORAL_TABLET | Freq: Four times a day (QID) | ORAL | Status: DC | PRN

## 2014-11-01 MED ORDER — FENTANYL CITRATE (PF) 250 MCG/5ML IJ SOLN
INTRAMUSCULAR | Status: AC
  Filled 2014-11-01: qty 25

## 2014-11-01 MED ORDER — BUPIVACAINE LIPOSOME 1.3 % IJ SUSP
20.0000 mL | Freq: Once | INTRAMUSCULAR | Status: DC
  Filled 2014-11-01: qty 20

## 2014-11-01 MED ORDER — FENTANYL CITRATE (PF) 100 MCG/2ML IJ SOLN
INTRAMUSCULAR | Status: DC | PRN
  Administered 2014-11-01 (×5): 50 ug via INTRAVENOUS

## 2014-11-01 MED ORDER — FLEET ENEMA 7-19 GM/118ML RE ENEM: 1.0000 | ENEMA | Freq: Once | RECTAL | Status: DC | PRN

## 2014-11-01 MED ORDER — TRANEXAMIC ACID 1000 MG/10ML IV SOLN
1000.0000 mg | INTRAVENOUS | Status: AC
  Administered 2014-11-01: 1000 mg via INTRAVENOUS
  Filled 2014-11-01 (×2): qty 10

## 2014-11-01 MED ORDER — FERROUS SULFATE 325 (65 FE) MG PO TABS
325.0000 mg | ORAL_TABLET | Freq: Three times a day (TID) | ORAL | Status: DC
  Administered 2014-11-01 – 2014-11-04 (×7): 325 mg via ORAL
  Filled 2014-11-01 (×11): qty 1

## 2014-11-01 MED ORDER — ROFLUMILAST 500 MCG PO TABS
500.0000 ug | ORAL_TABLET | Freq: Every day | ORAL | Status: DC
  Administered 2014-11-02 – 2014-11-04 (×3): 500 ug via ORAL
  Filled 2014-11-01 (×3): qty 1

## 2014-11-01 MED ORDER — RIVAROXABAN 10 MG PO TABS
10.0000 mg | ORAL_TABLET | Freq: Every day | ORAL | Status: DC
  Administered 2014-11-02 – 2014-11-04 (×3): 10 mg via ORAL
  Filled 2014-11-01 (×4): qty 1

## 2014-11-01 MED ORDER — BUPIVACAINE LIPOSOME 1.3 % IJ SUSP
INTRAMUSCULAR | Status: DC | PRN
  Administered 2014-11-01: 20 mL

## 2014-11-01 MED ORDER — MOMETASONE FURO-FORMOTEROL FUM 100-5 MCG/ACT IN AERO
2.0000 | INHALATION_SPRAY | Freq: Two times a day (BID) | RESPIRATORY_TRACT | Status: DC
  Administered 2014-11-01 – 2014-11-04 (×6): 2 via RESPIRATORY_TRACT
  Filled 2014-11-01: qty 8.8

## 2014-11-01 MED ORDER — LACTATED RINGERS IV SOLN
INTRAVENOUS | Status: DC
  Administered 2014-11-01: 1000 mL via INTRAVENOUS
  Administered 2014-11-01 (×3): via INTRAVENOUS

## 2014-11-01 MED ORDER — SODIUM CHLORIDE 0.9 % IJ SOLN
INTRAMUSCULAR | Status: DC | PRN
  Administered 2014-11-01: 50 mL

## 2014-11-01 MED ORDER — CYCLOSPORINE 0.05 % OP EMUL
1.0000 [drp] | Freq: Two times a day (BID) | OPHTHALMIC | Status: DC
  Administered 2014-11-01 – 2014-11-04 (×6): 1 [drp] via OPHTHALMIC
  Filled 2014-11-01 (×7): qty 1

## 2014-11-01 MED ORDER — HYDROCODONE-ACETAMINOPHEN 5-325 MG PO TABS
1.0000 | ORAL_TABLET | ORAL | Status: DC | PRN
  Administered 2014-11-02: 1 via ORAL
  Administered 2014-11-02: 2 via ORAL
  Administered 2014-11-02: 1 via ORAL
  Administered 2014-11-03 – 2014-11-04 (×6): 2 via ORAL
  Filled 2014-11-01 (×4): qty 2
  Filled 2014-11-01: qty 1
  Filled 2014-11-01: qty 2
  Filled 2014-11-01: qty 1
  Filled 2014-11-01 (×3): qty 2

## 2014-11-01 MED ORDER — POLYMYXIN B SULFATE 500000 UNITS IJ SOLR
INTRAMUSCULAR | Status: DC | PRN
  Administered 2014-11-01 (×2): 500 mL

## 2014-11-01 MED ORDER — ONDANSETRON HCL 4 MG/2ML IJ SOLN: 4.0000 mg | Freq: Four times a day (QID) | INTRAMUSCULAR | Status: DC | PRN

## 2014-11-01 MED ORDER — OXYCODONE-ACETAMINOPHEN 5-325 MG PO TABS
2.0000 | ORAL_TABLET | ORAL | Status: DC | PRN
Start: 2014-11-01 — End: 2014-11-04
  Administered 2014-11-01 – 2014-11-02 (×3): 1 via ORAL
  Filled 2014-11-01 (×3): qty 2

## 2014-11-01 SURGICAL SUPPLY — 57 items
BAG ZIPLOCK 12X15 (MISCELLANEOUS) IMPLANT
BLADE SAW SAG 73X25 THK (BLADE) ×1
BLADE SAW SGTL 73X25 THK (BLADE) ×1 IMPLANT
CAPT HIP TOTAL 2 ×2 IMPLANT
DRAPE INCISE IOBAN 85X60 (DRAPES) ×2 IMPLANT
DRAPE ORTHO SPLIT 77X108 STRL (DRAPES) ×2
DRAPE POUCH INSTRU U-SHP 10X18 (DRAPES) ×2 IMPLANT
DRAPE SURG 17X11 SM STRL (DRAPES) ×2 IMPLANT
DRAPE SURG ORHT 6 SPLT 77X108 (DRAPES) ×2 IMPLANT
DRAPE U-SHAPE 47X51 STRL (DRAPES) ×2 IMPLANT
DRSG ADAPTIC 3X8 NADH LF (GAUZE/BANDAGES/DRESSINGS) IMPLANT
DRSG AQUACEL AG ADV 3.5X10 (GAUZE/BANDAGES/DRESSINGS) ×2 IMPLANT
DRSG AQUACEL AG ADV 3.5X14 (GAUZE/BANDAGES/DRESSINGS) IMPLANT
DRSG TEGADERM 4X4.75 (GAUZE/BANDAGES/DRESSINGS) IMPLANT
DURAPREP 26ML APPLICATOR (WOUND CARE) ×2 IMPLANT
ELECT BLADE TIP CTD 4 INCH (ELECTRODE) ×2 IMPLANT
ELECT REM PT RETURN 9FT ADLT (ELECTROSURGICAL) ×2
ELECTRODE REM PT RTRN 9FT ADLT (ELECTROSURGICAL) ×1 IMPLANT
EVACUATOR 1/8 PVC DRAIN (DRAIN) IMPLANT
FACESHIELD WRAPAROUND (MASK) ×8 IMPLANT
GAUZE SPONGE 2X2 8PLY STRL LF (GAUZE/BANDAGES/DRESSINGS) IMPLANT
GAUZE SPONGE 4X4 12PLY STRL (GAUZE/BANDAGES/DRESSINGS) IMPLANT
GLOVE BIOGEL PI IND STRL 6.5 (GLOVE) ×1 IMPLANT
GLOVE BIOGEL PI IND STRL 8 (GLOVE) ×1 IMPLANT
GLOVE BIOGEL PI INDICATOR 6.5 (GLOVE) ×1
GLOVE BIOGEL PI INDICATOR 8 (GLOVE) ×1
GLOVE ECLIPSE 8.0 STRL XLNG CF (GLOVE) ×4 IMPLANT
GLOVE SURG SS PI 6.5 STRL IVOR (GLOVE) IMPLANT
GOWN STRL REUS W/TWL LRG LVL3 (GOWN DISPOSABLE) ×2 IMPLANT
GOWN STRL REUS W/TWL XL LVL3 (GOWN DISPOSABLE) ×2 IMPLANT
IMMOBILIZER KNEE 20 (SOFTGOODS) ×2
IMMOBILIZER KNEE 20 THIGH 36 (SOFTGOODS) ×1 IMPLANT
KIT BASIN OR (CUSTOM PROCEDURE TRAY) ×2 IMPLANT
LIQUID BAND (GAUZE/BANDAGES/DRESSINGS) ×2 IMPLANT
MANIFOLD NEPTUNE II (INSTRUMENTS) ×2 IMPLANT
NDL SAFETY ECLIPSE 18X1.5 (NEEDLE) ×1 IMPLANT
NEEDLE HYPO 18GX1.5 SHARP (NEEDLE) ×1
PACK TOTAL JOINT (CUSTOM PROCEDURE TRAY) ×2 IMPLANT
PEN SKIN MARKING BROAD (MISCELLANEOUS) ×2 IMPLANT
POSITIONER SURGICAL ARM (MISCELLANEOUS) ×2 IMPLANT
SPONGE GAUZE 2X2 STER 10/PKG (GAUZE/BANDAGES/DRESSINGS)
SPONGE LAP 18X18 X RAY DECT (DISPOSABLE) IMPLANT
SPONGE LAP 4X18 X RAY DECT (DISPOSABLE) ×2 IMPLANT
SPONGE SURGIFOAM ABS GEL 100 (HEMOSTASIS) ×2 IMPLANT
STAPLER VISISTAT 35W (STAPLE) IMPLANT
SUCTION FRAZIER TIP 10 FR DISP (SUCTIONS) ×2 IMPLANT
SUT MNCRL AB 4-0 PS2 18 (SUTURE) IMPLANT
SUT VIC AB 1 CT1 27 (SUTURE) ×1
SUT VIC AB 1 CT1 27XBRD ANTBC (SUTURE) ×1 IMPLANT
SUT VIC AB 2-0 CT1 27 (SUTURE) ×3
SUT VIC AB 2-0 CT1 TAPERPNT 27 (SUTURE) ×3 IMPLANT
SUT VLOC 180 0 24IN GS25 (SUTURE) ×2 IMPLANT
SYR 20CC LL (SYRINGE) ×2 IMPLANT
TOWEL OR 17X26 10 PK STRL BLUE (TOWEL DISPOSABLE) ×4 IMPLANT
TRAY FOLEY W/METER SILVER 14FR (SET/KITS/TRAYS/PACK) ×2 IMPLANT
WATER STERILE IRR 1500ML POUR (IV SOLUTION) ×2 IMPLANT
YANKAUER SUCT BULB TIP 10FT TU (MISCELLANEOUS) ×2 IMPLANT

## 2014-11-01 NOTE — Interval H&P Note (Signed)
History and Physical Interval Note:  11/01/2014 6:58 AM  Priscilla Cantrell  has presented today for surgery, with the diagnosis of right hip osteoarthritis  The various methods of treatment have been discussed with the patient and family. After consideration of risks, benefits and other options for treatment, the patient has consented to  Procedure(s): RIGHT TOTAL HIP ARTHROPLASTY (Right) as a surgical intervention .  The patient's history has been reviewed, patient examined, no change in status, stable for surgery.  I have reviewed the patient's chart and labs.  Questions were answered to the patient's satisfaction.     Jacari Kirsten A

## 2014-11-01 NOTE — Progress Notes (Signed)
CSW consulted for SNF placement. [facility] reports pt plans to return home following hospital d/c. RNCM will assist with d/c planning.  Werner Lean LCSW [phone]

## 2014-11-01 NOTE — Plan of Care (Signed)
Problem: Phase I Progression Outcomes Goal: Initial discharge plan identified Outcome: Completed/Met Date Met:  11/01/14 Pt plans to go home at discharge with the assistance of her daughter and friends/neighbors.

## 2014-11-01 NOTE — Anesthesia Postprocedure Evaluation (Signed)
  Anesthesia Post-op Note  Patient: Priscilla Cantrell  Procedure(s) Performed: Procedure(s) (LRB): RIGHT TOTAL HIP ARTHROPLASTY (Right)  Patient Location: PACU  Anesthesia Type: General  Level of Consciousness: awake and alert   Airway and Oxygen Therapy: Patient Spontanous Breathing  Post-op Pain: mild  Post-op Assessment: Post-op Vital signs reviewed, [name] Cardiovascular Status Stable, Respiratory Function Stable, Patent Airway and No signs of Nausea or vomiting. Oxygen saturation in low 90's on nasal cannula. She had a room air saturation of 81% preoperatively. She wears oxygen at night at home. She denies dyspnea. Lungs clear on auscultation, no wheezes heard. She was offered an albuterol nebulization treatment which she declined as she didn't feel she needed it.  Last Vitals:  Filed Vitals:   11/01/14 1113  BP: 105/45  Pulse: 73  Temp: 36.6 C  Resp: 18    Post-op Vital Signs: stable   Complications: No apparent anesthesia complications

## 2014-11-01 NOTE — Brief Op Note (Signed)
11/01/2014  8:49 AM  PATIENT:  Priscilla Cantrell  79 y.o. female  PRE-OPERATIVE DIAGNOSIS:  right hip Primary osteoarthritis  POST-OPERATIVE DIAGNOSIS:  right hip Primary osteoarthritis  PROCEDURE:  Procedure(s): RIGHT TOTAL HIP ARTHROPLASTY (Right)  SURGEON:  Surgeon(s) and Role:    * Latanya Maudlin, MD - Primary  PHYSICIAN ASSISTANT: Ardeen Jourdain PA  ASSISTANTS:Amber Santiago PA  ANESTHESIA:   general  EBL:  Total I/O In: 1000 [I.V.:1000] Out: 1250 [Urine:900; Blood:350]  BLOOD ADMINISTERED:none  DRAINS: none   LOCAL MEDICATIONS USED:  BUPIVICAINE 20cc of Exparel mixed with 20cc of Normal Saline  SPECIMEN:  No Specimen  DISPOSITION OF SPECIMEN:  N/A  COUNTS:  YES  TOURNIQUET:  * No tourniquets in log *  DICTATION: .Other Dictation: Dictation Number [id number]  PLAN OF CARE: Admit to inpatient   PATIENT DISPOSITION:  Stable in OR   Delay start of Pharmacological VTE agent (>24hrs) due to surgical blood loss or risk of bleeding: yes

## 2014-11-01 NOTE — Progress Notes (Signed)
Utilization review completed.  

## 2014-11-01 NOTE — Transfer of Care (Signed)
Immediate Anesthesia Transfer of Care Note  Patient: Priscilla Cantrell  Procedure(s) Performed: Procedure(s): RIGHT TOTAL HIP ARTHROPLASTY (Right)  Patient Location: PACU  Anesthesia Type:General  Level of Consciousness: awake, alert  and oriented  Airway & Oxygen Therapy: Patient Spontanous Breathing and Patient connected to face mask oxygen  Post-op Assessment: Report given to RN and Post -op Vital signs reviewed and stable  Post vital signs: Reviewed and stable  Last Vitals:  Filed Vitals:   11/01/14 0534  BP: 126/60  Pulse: 75  Temp: 36.4 C  Resp: 18    Complications: No apparent anesthesia complications

## 2014-11-01 NOTE — Anesthesia Procedure Notes (Signed)
Procedure Name: Intubation Date/Time: 11/01/2014 7:34 AM Performed by: Noralyn Pick D Pre-anesthesia Checklist: Patient identified, Emergency Drugs available, Suction available and Patient being monitored Patient Re-evaluated:Patient Re-evaluated prior to inductionOxygen Delivery Method: Circle System Utilized Preoxygenation: Pre-oxygenation with 100% oxygen Intubation Type: IV induction Ventilation: Mask ventilation without difficulty Laryngoscope Size: Mac and 3 Grade View: Grade II Tube type: Oral Tube size: 7.5 mm Number of attempts: 1 Airway Equipment and Method: Stylet and Oral airway Placement Confirmation: ETT inserted through vocal cords under direct vision,  positive ETCO2 and breath sounds checked- equal and bilateral Secured at: 21 cm Tube secured with: Tape Dental Injury: Teeth and Oropharynx as per pre-operative assessment

## 2014-11-01 NOTE — Anesthesia Preprocedure Evaluation (Addendum)
Anesthesia Evaluation  Patient identified by MRN, date of birth, ID band Patient awake    Reviewed: Allergy & Precautions, NPO status , [name] Chart, lab work & pertinent test results  History of Anesthesia Complications (+) Family history of anesthesia reaction  Airway Mallampati: II  TM Distance: >3 FB Neck ROM: Full    Dental  (+) Poor Dentition, Edentulous Upper Missing some lower teeth. Denies loose.:   Pulmonary asthma , COPD,  COPD inhaler, former smoker,    Pulmonary exam normal breath sounds clear to auscultation       Cardiovascular hypertension, Pt. on medications + Peripheral Vascular Disease  negative cardio ROS Normal cardiovascular exam Rhythm:Regular Rate:Normal     Neuro/Psych PSYCHIATRIC DISORDERS  Neuromuscular disease    GI/Hepatic Neg liver ROS, GERD  Medicated,  Endo/Other  Hypothyroidism   Renal/GU negative Renal ROS  negative genitourinary   Musculoskeletal  (+) Arthritis ,   Abdominal   Peds negative pediatric ROS (+)  Hematology negative hematology ROS (+)   Anesthesia Other Findings   Reproductive/Obstetrics negative OB ROS                          Anesthesia Physical Anesthesia Plan  ASA: III  Anesthesia Plan: General   Post-op Pain Management:    Induction: Intravenous  Airway Management Planned: Oral ETT  Additional Equipment:   Intra-op Plan:   Post-operative Plan: Extubation in OR  Informed Consent: I have reviewed the patients History and Physical, chart, labs and discussed the procedure including the risks, benefits and alternatives for the proposed anesthesia with the patient or authorized representative who has indicated his/her understanding and acceptance.   Dental advisory given  Plan Discussed with: CRNA  Anesthesia Plan Comments: (Spinal stenosis with neurogenic claudication. Plan general.)       Anesthesia Quick  Evaluation

## 2014-11-01 NOTE — Evaluation (Signed)
Physical Therapy Evaluation Patient Details Name: Priscilla Cantrell MRN: [id number] DOB: 1935/02/12 Today's Date: 11/01/2014   History of Present Illness  Posterior THA R  Clinical Impression  Patient tolerated mobilizing to the bed edge and then took 5 steps forward and backward. Patient will benefit from PT to address problems listed in note below.    Follow Up Recommendations Home health PT;Supervision/Assistance - 24 hour    Equipment Recommendations  Rolling walker with 5" wheels    Recommendations for Other Services       Precautions / Restrictions Precautions Precautions: Posterior Hip Restrictions Weight Bearing Restrictions: Yes RLE Weight Bearing: Partial weight bearing RLE Partial Weight Bearing Percentage or Pounds: 50      Mobility  Bed Mobility Overal bed mobility: Needs Assistance Bed Mobility: Supine to Sit;Sit to Supine     Supine to sit: Mod assist;HOB elevated Sit to supine: Mod assist   General bed mobility comments: cues for precautions and technique  Transfers Overall transfer level: Needs assistance Equipment used: Rolling walker (2 wheeled) Transfers: Sit to/from Stand   Stand pivot transfers: Min assist;+2 safety/equipment       General transfer comment: cues  for  safety and precautions, hand pllacement  Ambulation/Gait Ambulation/Gait assistance: Mod assist;+2 safety/equipment Ambulation Distance (Feet): 5 Feet (forward and backward, 5 sidesteps) Assistive device: Rolling walker (2 wheeled) Gait Pattern/deviations: Step-to pattern;Decreased stance time - right     General Gait Details: cues for 50%  Stairs            Wheelchair Mobility    Modified Rankin (Stroke Patients Only)       Balance                                             Pertinent Vitals/Pain Pain Assessment: 0-10 Pain Score: 1  Pain Descriptors / Indicators: Aching Pain Intervention(s): Monitored during session;Repositioned     Home Living Family/patient expects to be discharged to:: Private residence Living Arrangements: Alone Available Help at Discharge: Family Type of Home: House Home Access: Stairs to enter Entrance Stairs-Rails: None   Home Layout: Two level;Able to live on main level with bedroom/bathroom Home Equipment: Cane - quad;Grab bars - tub/shower;Shower seat;Walker - 4 wheels      Prior Function Level of Independence: Independent with assistive device(s)               Hand Dominance        Extremity/Trunk Assessment   Upper Extremity Assessment: Overall WFL for tasks assessed           Lower Extremity Assessment: RLE deficits/detail RLE Deficits / Details: assist with moving on the bed, bears weight       Communication   Communication: No difficulties  Cognition Arousal/Alertness: Awake/alert Behavior During Therapy: WFL for tasks assessed/performed Overall Cognitive Status: Within Functional Limits for tasks assessed                      General Comments      Exercises        Assessment/Plan    PT Assessment Patient needs continued PT services  PT Diagnosis Difficulty walking;Acute pain   PT Problem List Decreased strength;Decreased range of motion;Decreased activity tolerance;Decreased mobility;Decreased knowledge of use of DME;Decreased safety awareness;Decreased knowledge of precautions;Pain  PT Treatment Interventions DME instruction;Gait training;Stair training;Functional mobility training;Therapeutic activities;Patient/family education;Therapeutic exercise  PT Goals (Current goals can be found in the Care Plan section) Acute Rehab PT Goals Patient Stated Goal: to go home PT Goal Formulation: With patient Time For Goal Achievement: 11/08/14 Potential to Achieve Goals: Good    Frequency 7X/week   Barriers to discharge        Co-evaluation               End of Session   Activity Tolerance: Patient tolerated treatment  well Patient left: in bed;with call bell/phone within reach;with bed alarm set Nurse Communication: Mobility status         Time: 1884-1660 PT Time Calculation (min) (ACUTE ONLY): 23 min   Charges:   PT Evaluation $Initial PT Evaluation Tier I: 1 Procedure PT Treatments $Gait Training: 8-22 mins   PT G Codes:        Claretha Cooper 11/01/2014, 5:12 PM

## 2014-11-01 NOTE — Op Note (Signed)
NAMERICHEL, MILLSPAUGH NO.:  [id number]  MEDICAL RECORD NO.:  [id number]  LOCATION:  [facility]                         FACILITY:  [facility]  PHYSICIAN:  Kipp Brood. Jahanna Raether, M.D.DATE OF BIRTH:  01-20-1936  DATE OF PROCEDURE:  11/01/2014 DATE OF DISCHARGE:                              OPERATIVE REPORT   SURGEON:  Kipp Brood. Gladstone Lighter, M.D.  ASSISTANT:  Ardeen Jourdain, [place].  PREOPERATIVE DIAGNOSIS:  Severe primary osteoarthritis, right hip with bone on bone.  POSTOPERATIVE DIAGNOSIS:  Severe primary osteoarthritis, right hip with bone on bone.  OPERATION:  Right total hip arthroplasty utilizing DePuy system.  I utilized a size 4 Tri-Lock stem standard offset.  The ball was a size 32.  It was a ceramic ball of +1 neck.  The cup size was a size 50 pinnacle cup with one screw measuring 30 mm in length for fixation and a hole eliminator.  The liner was a polyethylene liner 32 mm inside diameter.  ANESTHESIA:  General anesthesia.  PROCEDURE IN DETAIL:  Under general anesthesia, the patient was on left side, right side up.  Appropriate time-out was first carried out.  I also marked the appropriate right hip in the holding area.  She had 1 g of IV vancomycin since she was allergic to penicillin.  The posterolateral approach was carried out after we did a time-out. Bleeders were identified and cauterized.  Self-retaining retractors were inserted.  I then went down and incised the iliotibial band.  I then partially detached the external rotators, dislocated the hip, removed a few loose bodies.  The capsule was extremely thick, and I did a capsulectomy.  Following that, I amputated the femoral head at the appropriate neck length.  I then utilized the box osteotome, followed by the widening reamer, followed by the canal finer.  We then thoroughly irrigated out the canal, and I rasped the canal up to a size 4. Following that, I irrigated out the canal, packed the canal open with  a large sponge which was later removed.  Attention then was directed to the acetabulum.  I completed the capsulectomy.  I then reamed the acetabulum up to a 49 for a size 50 Pinnacle cup.  The Pinnacle cup then was inserted and then the Charnley guide then was used.  We had excellent position.  Following that, I inserted a hole eliminator, and then, a drill hole was made in the acetabulum with great care taken not to penetrate the sciatic notch.  I then inserted a 130 mm length screw. The cup then was inserted.  The in-liner was inserted.  I then directed attention to the femur.  I removed the sponge packing.  As I mentioned, there was a large sponge.  I irrigated the canal and then inserted my standard offset Tri-Lock stem size 4.  Once this was done, I then went ahead and utilized the +1 ceramic ball, inserted that over the femoral component, reduced the hip.  We made sure no tissue was in the hip per se.  We had good function and good leg lengths, and the hip was extremely stable.  We then irrigated out the area.  I injected a part of  the mixture of Exparel and normal saline.  The remaining part was injected in the soft tissue later.  I inserted some thrombin-soaked Gelfoam.  The wound was closed in layers in usual fashion.  We did irrigate with antibiotic solution prior to inserting any Gelfoam or Exparel.  Sterile dressings were applied.          ______________________________ Kipp Brood Gladstone Lighter, M.D.     RAG/MEDQ  D:  11/01/2014  T:  11/01/2014  Job:  [id number]

## 2014-11-02 LAB — BASIC METABOLIC PANEL
Anion gap: 5 (ref 5–15)
BUN: 11 mg/dL (ref 6–20)
CO2: 27 mmol/L (ref 22–32)
Calcium: 8.7 mg/dL — ABNORMAL LOW (ref 8.9–10.3)
Chloride: 107 mmol/L (ref 101–111)
Creatinine, Ser: 0.6 mg/dL (ref 0.44–1.00)
GFR calc Af Amer: >60 mL/min (ref >60)
GFR calc non Af Amer: >60 mL/min (ref >60)
Glucose, Bld: 123 mg/dL — ABNORMAL HIGH (ref 65–99)
Potassium: 4.1 mmol/L (ref 3.5–5.1)
Sodium: 139 mmol/L (ref 135–145)

## 2014-11-02 LAB — CBC
HCT: 30.4 % — ABNORMAL LOW (ref 36.0–46.0)
Hemoglobin: 9.4 g/dL — ABNORMAL LOW (ref 12.0–15.0)
MCH: 25.4 pg — ABNORMAL LOW (ref 26.0–34.0)
MCHC: 30.9 g/dL (ref 30.0–36.0)
MCV: 82.2 fL (ref 78.0–100.0)
Platelets: 234 10*3/uL (ref 150–400)
RBC: 3.7 MIL/uL — ABNORMAL LOW (ref 3.87–5.11)
RDW: 16.2 % — ABNORMAL HIGH (ref 11.5–15.5)
WBC: 13.1 10*3/uL — ABNORMAL HIGH (ref 4.0–10.5)

## 2014-11-02 MED ORDER — HYDROCODONE-ACETAMINOPHEN 5-325 MG PO TABS: 1.0000 | ORAL_TABLET | ORAL | Status: DC | PRN

## 2014-11-02 MED ORDER — METHOCARBAMOL 500 MG PO TABS: 500.0000 mg | ORAL_TABLET | Freq: Four times a day (QID) | ORAL | Status: DC | PRN

## 2014-11-02 MED ORDER — RIVAROXABAN 10 MG PO TABS: 10.0000 mg | ORAL_TABLET | Freq: Every day | ORAL | Status: DC

## 2014-11-02 NOTE — Progress Notes (Signed)
CSW contacted RNCM from [facility] to review d/c plan. RNCM reports that pt will return home with support from her sister. RNCM will assist with d/c planning.  Werner Lean LCSW [phone]

## 2014-11-02 NOTE — Discharge Instructions (Addendum)
INSTRUCTIONS AFTER JOINT REPLACEMENT   Remove items at home which could result in a fall. This includes throw rugs or furniture in walking pathways ICE to the affected joint every three hours while awake for 30 minutes at a time, for at least the first 3-5 days, and then as needed for pain and swelling.  Continue to use ice for pain and swelling. You may notice swelling that will progress down to the foot and ankle.  This is normal after surgery.  Elevate your leg when you are not up walking on it.   Continue to use the breathing machine you got in the hospital (incentive spirometer) which will help keep your temperature down.  It is common for your temperature to cycle up and down following surgery, especially at night when you are not up moving around and exerting yourself.  The breathing machine keeps your lungs expanded and your temperature down.   DIET:  As you were doing prior to hospitalization, we recommend a well-balanced diet.  DRESSING / WOUND CARE / SHOWERING  Keep the surgical dressing until follow up.  The dressing is water proof, so you can shower without any extra covering.  IF THE DRESSING FALLS OFF or the wound gets wet inside, change the dressing with sterile gauze.  Please use good hand washing techniques before changing the dressing.  Do not use any lotions or creams on the incision until instructed by your surgeon.    ACTIVITY  Increase activity slowly as tolerated, but follow the weight bearing instructions below.   No driving for 6 weeks or until further direction given by your physician.  You cannot drive while taking narcotics.  No lifting or carrying greater than 10 lbs. until further directed by your surgeon. Avoid periods of inactivity such as sitting longer than an hour when not asleep. This helps prevent blood clots.  You may return to work once you are authorized by your doctor.   WEIGHT BEARING   Partial weight bearing with assist device as directed.  50%     CONSTIPATION  Constipation is defined medically as fewer than three stools per week and severe constipation as less than one stool per week.  Even if you have a regular bowel pattern at home, your normal regimen is likely to be disrupted due to multiple reasons following surgery.  Combination of anesthesia, postoperative narcotics, change in appetite and fluid intake all can affect your bowels.   YOU MUST use at least one of the following options; they are listed in order of increasing strength to get the job done.  They are all available over the counter, and you may need to use some, POSSIBLY even all of these options:    Drink plenty of fluids (prune juice may be helpful) and high fiber foods Colace 100 mg by mouth twice a day  Senokot for constipation as directed and as needed Dulcolax (bisacodyl), take with full glass of water  Miralax (polyethylene glycol) once or twice a day as needed.  If you have tried all these things and are unable to have a bowel movement in the first 3-4 days after surgery call either your surgeon or your primary doctor.    If you experience loose stools or diarrhea, hold the medications until you stool forms back up.  If your symptoms do not get better within 1 week or if they get worse, check with your doctor.  If you experience "the worst abdominal pain ever" or develop nausea or vomiting,  please contact the office immediately for further recommendations for treatment.   ITCHING:  If you experience itching with your medications, try taking only a single pain pill, or even half a pain pill at a time.  You can also use Benadryl over the counter for itching or also to help with sleep.     MEDICATIONS:  See your medication summary on the After Visit Summary that nursing will review with you.  You may have some home medications which will be placed on hold until you complete the course of blood thinner medication.  It is important for you to complete the blood  thinner medication as prescribed.  PRECAUTIONS:  If you experience chest pain or shortness of breath - call 911 immediately for transfer to the hospital emergency department.   If you develop a fever greater that 101 F, purulent drainage from wound, increased redness or drainage from wound, foul odor from the wound/dressing, or calf pain - CONTACT YOUR SURGEON.                                                   FOLLOW-UP APPOINTMENTS:  If you do not already have a post-op appointment, please call the office for an appointment to be seen by your surgeon.  Guidelines for how soon to be seen are listed in your After Visit Summary, but are typically between 1-4 weeks after surgery.   MAKE SURE YOU:  Understand these instructions.  Get help right away if you are not doing well or get worse.    Thank you for letting us be a part of your medical care team.  It is a privilege we respect greatly.  We hope these instructions will help you stay on track for a fast and full recovery!    Information on my medicine - XARELTO (Rivaroxaban)  This medication education was reviewed with me or my healthcare representative as part of my discharge preparation.  The pharmacist that spoke with me during my hospital stay was:  WOFFORD, DREW A, RPH  Why was Xarelto prescribed for you? Xarelto was prescribed for you to reduce the risk of blood clots forming after orthopedic surgery. The medical term for these abnormal blood clots is venous thromboembolism (VTE).  What do you need to know about xarelto ? Take your Xarelto ONCE DAILY at the same time every day. You may take it either with or without food.  If you have difficulty swallowing the tablet whole, you may crush it and mix in applesauce just prior to taking your dose.  Take Xarelto exactly as prescribed by your doctor and DO NOT stop taking Xarelto without talking to the doctor who prescribed the medication.  Stopping without other VTE prevention  medication to take the place of Xarelto may increase your risk of developing a clot.  After discharge, you should have regular check-up appointments with your healthcare provider that is prescribing your Xarelto.    What do you do if you miss a dose? If you miss a dose, take it as soon as you remember on the same day then continue your regularly scheduled once daily regimen the next day. Do not take two doses of Xarelto on the same day.   Important Safety Information A possible side effect of Xarelto is bleeding. You should call your healthcare provider right away if you  experience any of the following: ? Bleeding from an injury or your nose that does not stop. ? Unusual colored urine (red or dark brown) or unusual colored stools (red or black). ? Unusual bruising for unknown reasons. ? A serious fall or if you hit your head (even if there is no bleeding).  Some medicines may interact with Xarelto and might increase your risk of bleeding while on Xarelto. To help avoid this, consult your healthcare provider or pharmacist prior to using any new prescription or non-prescription medications, including herbals, vitamins, non-steroidal anti-inflammatory drugs (NSAIDs) and supplements.  This website has more information on Xarelto: [email or web address]

## 2014-11-02 NOTE — Evaluation (Signed)
Occupational Therapy Evaluation Patient Details Name: Priscilla Cantrell MRN: [id number] DOB: [birth year] Today's Date: 11/02/2014    History of Present Illness Posterior Right THA   Clinical Impression   Patient presenting with decreased ADL and functional mobility independence secondary to above. Patient independent > mod I PTA. Patient currently functioning at a min assist level for functional mobility and requires up to max assist for LB ADLs. Patient will benefit from acute OT to increase overall independence in the areas of ADLs, functional mobility, education on posterior hip precautions, and overall safety in order to safely discharge to venue listed below.   Pt lives alone and has occasional intermittent assistance post acute d/c. Pt will benefit from post acute rehab prior to d/c>home alone. Pt min assist with functional mobility using RW and requires up to max assist with LB ADLs. Pt requires multimodal cueing to adhere to posterior hip precautions and PWB>RLE during functional activities/tasks and mobility.     Follow Up Recommendations  SNF;Supervision/Assistance - 24 hour    Equipment Recommendations  Other (comment) (AE - reacher, sock aid, LH sponge, LH shoe horn; all other equipment TBD next venue of care)    Recommendations for Other Services  None at this time   Precautions / Restrictions Precautions Precautions: Posterior Hip;Fall (high fall risk) Precaution Comments: Administerred posterior hip precautions and AE handout Restrictions Weight Bearing Restrictions: Yes RLE Weight Bearing: Partial weight bearing RLE Partial Weight Bearing Percentage or Pounds: 50%      Mobility Bed Mobility General bed mobility comments: Pt found seated in recliner upon OT entering/exiting room  Transfers Overall transfer level: Needs assistance Equipment used: Rolling walker (2 wheeled) Transfers: Sit to/from Stand   Stand pivot transfers: Min assist General transfer comment:  Min assist for power up using RW. Cues for hand placement, technique, and PWB(50%)>RLE    Balance Overall balance assessment: Needs assistance Sitting-balance support: No upper extremity supported;Feet supported Sitting balance-Leahy Scale: Fair     Standing balance support: Bilateral upper extremity supported;During functional activity Standing balance-Leahy Scale: Fair    ADL Overall ADL's : Needs assistance/impaired Eating/Feeding: Set up;Sitting   Grooming: Set up;Sitting   Upper Body Bathing: Set up;Sitting   Lower Body Bathing: Sit to/from stand;Moderate assistance   Upper Body Dressing : Set up;Sitting   Lower Body Dressing: Maximal assistance;Sit to/from stand   Toilet Transfer: Minimal assistance;RW;BSC General ADL Comments: Pt attempted to ambulate into BR for toilet transfer, but due to feeling "woozy" pt unable. Encouraged pt to call for assistance and have staff bring [facility] beside her. Pt with increased pain and low 02 sats during session (on RA). Sats=as low as 80% on RA, donned 3 L/min supplemental 02 and sats increased >96% while pt seated. Pt reports she has supplemental 02 at home and only wears this at night. Educated pt on posterior hip precautions with handout given, PWB>RLE, and AE with handout given. Pt states she does not feel safe going home alone and prefers rehab post acute d/c, this therapist agrees.     Pertinent Vitals/Pain Pain Assessment: 0-10 Pain Score: 8  Pain Location: right hip Pain Descriptors / Indicators: Aching;Sore Pain Intervention(s): Limited activity within patient's tolerance;Monitored during session;Repositioned     Hand Dominance Right   Extremity/Trunk Assessment Upper Extremity Assessment Upper Extremity Assessment: Overall WFL for tasks assessed   Lower Extremity Assessment Lower Extremity Assessment: Defer to PT evaluation   Cervical / Trunk Assessment Cervical / Trunk Assessment: Kyphotic   Communication  Communication Communication:  No difficulties   Cognition Arousal/Alertness: Awake/alert Behavior During Therapy: WFL for tasks assessed/performed Overall Cognitive Status: Within Functional Limits for tasks assessed             Home Living Family/patient expects to be discharged to:: Private residence Living Arrangements: Alone Available Help at Discharge: Family;Available PRN/intermittently Type of Home: House Home Access: Stairs to enter [employer] of Steps: 4 Entrance Stairs-Rails: None Home Layout: Two level;Able to live on main level with bedroom/bathroom     Bathroom Shower/Tub: Walk-in shower;Door   Bathroom Toilet: Handicapped height     Home Equipment: Cane - quad;Grab bars - tub/shower;Shower seat;Walker - 4 wheels    Prior Functioning/Environment Level of Independence: Independent with assistive device(s)  Comments: used cane PTA    OT Diagnosis: Generalized weakness;Acute pain   OT Problem List: Decreased strength;Decreased activity tolerance;Impaired balance (sitting and/or standing);Decreased safety awareness;Decreased knowledge of use of DME or AE;Decreased knowledge of precautions;Pain   OT Treatment/Interventions: Self-care/ADL training;Energy conservation;DME and/or AE instruction;Therapeutic activities;Patient/family education;Balance training    OT Goals(Current goals can be found in the care plan section) Acute Rehab OT Goals Patient Stated Goal: go to rehab OT Goal Formulation: With patient Time For Goal Achievement: 11/16/14 Potential to Achieve Goals: Good ADL Goals Pt Will Perform Grooming: with modified independence;standing Pt Will Perform Lower Body Bathing: with supervision;sit to/from stand;with adaptive equipment Pt Will Perform Lower Body Dressing: with supervision;sit to/from stand;with adaptive equipment Pt Will Transfer to Toilet: ambulating;bedside commode;with modified independence Pt Will Perform Tub/Shower Transfer:  Shower transfer;shower seat;rolling walker;ambulating;with supervision Additional ADL Goal #1: Pt will independently verbalize and adhere to posterior hip precautions and PWB>RLE 100% of the time during ADLs and functional mobility/transfers  OT Frequency: Min 2X/week   Barriers to D/C: Decreased caregiver support   End of Session Equipment Utilized During Treatment: Gait belt;Rolling walker;Oxygen  Activity Tolerance: Other (comment) (limited by feeling "woozy") Patient left: in chair;with call bell/phone within reach;with family/visitor present   Time: 1030-1050 OT Time Calculation (min): 20 min Charges:  OT General Charges $OT Visit: 1 Procedure OT Evaluation $Initial OT Evaluation Tier I: 1 Procedure  Zakary Kimura , MS, OTR/L, CLT Pager: [phone]  11/02/2014, 11:06 AM

## 2014-11-02 NOTE — Care Management Note (Signed)
Case Management Note  Patient Details  Name: Priscilla Cantrell MRN: [id number] Date of Birth: [birth year]  Subjective/Objective:                   RIGHT TOTAL HIP ARTHROPLASTY (Right) Action/Plan:  Discharge planning Expected Discharge Date:                  Expected Discharge Plan:  [place]  In-House Referral:     Discharge planning Services  CM Consult  Post Acute Care Choice:  Home Health Choice offered to:     DME Arranged:    DME Agency:     HH Arranged:  PT HH Agency:  [place]  Status of Service:  In process, will continue to follow  Medicare Important Message Given:    Date Medicare IM Given:    Medicare IM give by:    Date Additional Medicare IM Given:    Additional Medicare Important Message give by:     If discussed at [place] of Stay Meetings, dates discussed:    Additional Comments: CM met with pt in room to offer choice of home health agency.  Pt is unsure as to whether or not she will go to SNF but CM reminded this is only the first post op day and she will most likely progress to a level of stability for going home with HHPT as arranged presurgically.  Pt chooses [facility] to render HHPT.  Referral given to [facility] rep, Tim ( on unit).  CM will continue to follow for progress. Dellie Catholic, RN 11/02/2014, 4:56 PM

## 2014-11-02 NOTE — Progress Notes (Signed)
Subjective: 1 Day Post-Op Procedure(s) (LRB): RIGHT TOTAL HIP ARTHROPLASTY (Right) Patient reports pain as 2 on 0-10 scale.  Doing well today.  Objective: Vital signs in last 24 hours: Temp:  [97.4 F (36.3 C)-98.9 F (37.2 C)] 98.4 F (36.9 C) (09/29 0547) Pulse Rate:  [67-79] 73 (09/29 0547) Resp:  [11-18] 14 (09/29 0547) BP: (91-151)/(40-57) 121/53 mmHg (09/29 0547) SpO2:  [90 %-96 %] 91 % (09/29 0547) Weight:  [65.318 kg (144 lb)] 65.318 kg (144 lb) (09/28 1113)  Intake/Output from previous day: 09/28 0701 - 09/29 0700 In: 2840 [P.O.:840; I.V.:2000] Out: 3450 [Urine:3100; Blood:350] Intake/Output this shift:     Recent Labs  11/02/14 0515  HGB 9.4*    Recent Labs  11/02/14 0515  WBC 13.1*  RBC 3.70*  HCT 30.4*  PLT 234    Recent Labs  11/02/14 0515  NA 139  K 4.1  CL 107  CO2 27  BUN 11  CREATININE 0.60  GLUCOSE 123*  CALCIUM 8.7*   No results for input(s): LABPT, INR in the last 72 hours.  Dorsiflexion/Plantar flexion intact No cellulitis present Compartment soft  Assessment/Plan: 1 Day Post-Op Procedure(s) (LRB): RIGHT TOTAL HIP ARTHROPLASTY (Right) Up with therapy  Emaline Karnes A 11/02/2014, 7:10 AM

## 2014-11-02 NOTE — Progress Notes (Signed)
Physical Therapy Treatment Note    11/02/14 1600  PT Visit Information  Last PT Received On 11/02/14  Assistance Needed +1  History of Present Illness Pt is a 79 year old female s/p R posterior approach THA with hx of spinal stenosis, DDD, COPD, and chronic oxygen use  PT Time Calculation  PT Start Time (ACUTE ONLY) 1514  PT Stop Time (ACUTE ONLY) 1537  PT Time Calculation (min) (ACUTE ONLY) 23 min  Subjective Data  Subjective Pt performed LE exercises and then assisted into bathroom.  Pt to pull cord when finished and/or needing assist (due to void).    Precautions  Precautions Posterior Hip;Fall  Restrictions  Weight Bearing Restrictions Yes  RLE Weight Bearing PWB  RLE Partial Weight Bearing Percentage or Pounds 50%  Pain Assessment  Pain Assessment 0-10  Pain Score 4  Pain Location R hip  Pain Descriptors / Indicators Aching;Sore  Pain Intervention(s) Limited activity within patient's tolerance;Monitored during session  Cognition  Arousal/Alertness Awake/alert  Behavior During Therapy WFL for tasks assessed/performed  Overall Cognitive Status Within Functional Limits for tasks assessed  Bed Mobility  Overal bed mobility Needs Assistance  Bed Mobility Supine to Sit  Supine to sit Min assist  General bed mobility comments assist for R LE, verbal cues for self assist  Transfers  Overall transfer level Needs assistance  Equipment used Rolling walker (2 wheeled)  Transfers Sit to/from Stand  Sit to Stand Min assist  General transfer comment verbal cues for safe technique and precautions, assist to rise and steady  Ambulation/Gait  Ambulation/Gait assistance Min guard  Ambulation Distance (Feet) 9 Feet  Assistive device Rolling walker (2 wheeled)  Gait Pattern/deviations Step-to pattern;Antalgic  General Gait Details verbal cues for sequence, maintaining precautions, maintaining 3L O2 [place]  Exercises  Exercises Total Joint  Total Joint Exercises  Ankle Circles/Pumps  AROM;Both;10 reps  Quad Sets AROM;Both;10 reps  Short Arc Quad AROM;Right;10 reps  Heel Slides AAROM;Right;10 reps (within precautions)  Hip ABduction/ADduction Right;AAROM;10 reps  PT - End of Session  Equipment Utilized During Treatment Oxygen  Activity Tolerance Patient tolerated treatment well  Patient left (in bathroom, will use pull cord)  Nurse Communication (NT aware pt in bathroom and to use pull cord)  PT - Assessment/Plan  PT Plan Current plan remains appropriate  PT Frequency (ACUTE ONLY) 7X/week  Follow Up Recommendations Supervision/Assistance - 24 hour;SNF  PT equipment Rolling walker with 5" wheels  PT Goal Progression  Progress towards PT goals Progressing toward goals  PT General Charges  $$ ACUTE PT VISIT 1 Procedure  PT Treatments  $Gait Training 8-22 mins  $Therapeutic Exercise 8-22 mins   Carmelia Bake, PT, DPT 11/02/2014 Pager: [phone]

## 2014-11-02 NOTE — Progress Notes (Signed)
Physical Therapy Treatment Patient Details Name: Priscilla Cantrell MRN: [id number] DOB: January 26, 1936 Today's Date: 11/02/2014    History of Present Illness Pt is a 79 year old female s/p R posterior approach THA with hx of spinal stenosis, DDD, COPD, and chronic oxygen use    PT Comments    Pt making slow progress.  Pt requiring assist for mobility and cues for maintaining posterior hip precautions and PWB status.  Pt would benefit from ST-SNF prior to home.  Pt also reports chronic 2L O2 at baseline.  Follow Up Recommendations  Supervision/Assistance - 24 hour;SNF     Equipment Recommendations  Rolling walker with 5" wheels    Recommendations for Other Services       Precautions / Restrictions Precautions Precautions: Posterior Hip;Fall Precaution Comments: pt only able to recall 2/3 hip precautions Restrictions Weight Bearing Restrictions: Yes RLE Weight Bearing: Partial weight bearing RLE Partial Weight Bearing Percentage or Pounds: 50%    Mobility  Bed Mobility Overal bed mobility: Needs Assistance Bed Mobility: Supine to Sit     Supine to sit: Mod assist     General bed mobility comments: assist for R LE and scooting to EOB, utilized bed pad, cues for precautions  Transfers Overall transfer level: Needs assistance Equipment used: Rolling walker (2 wheeled) Transfers: Sit to/from Stand   Stand pivot transfers: Min assist       General transfer comment: verbal cues for safe technique and precautions, assist to rise and steady  Ambulation/Gait Ambulation/Gait assistance: Min assist Ambulation Distance (Feet): 40 Feet Assistive device: Rolling walker (2 wheeled) Gait Pattern/deviations: Step-to pattern;Wide base of support     General Gait Details: verbal cues for sequence, PWB, posterior hip precautions especially with turning, R foot with increased lateral positioning despite cues (possible knee valgus), maintained 3L O2 [place]   Stairs             Wheelchair Mobility    Modified Rankin (Stroke Patients Only)       Balance Overall balance assessment: Needs assistance Sitting-balance support: No upper extremity supported;Feet supported Sitting balance-Leahy Scale: Fair     Standing balance support: Bilateral upper extremity supported;During functional activity Standing balance-Leahy Scale: Fair                      Cognition Arousal/Alertness: Awake/alert Behavior During Therapy: WFL for tasks assessed/performed Overall Cognitive Status: Within Functional Limits for tasks assessed                      Exercises      General Comments        Pertinent Vitals/Pain Pain Assessment: 0-10 Pain Score: 6  Pain Location: R hip Pain Descriptors / Indicators: Aching;Sore Pain Intervention(s): Limited activity within patient's tolerance;Monitored during session;Premedicated before session;Repositioned    Home Living Family/patient expects to be discharged to:: Private residence Living Arrangements: Alone Available Help at Discharge: Family;Available PRN/intermittently Type of Home: House Home Access: Stairs to enter Entrance Stairs-Rails: None Home Layout: Two level;Able to live on main level with bedroom/bathroom Home Equipment: Cane - quad;Grab bars - tub/shower;Shower seat;Walker - 4 wheels      Prior Function Level of Independence: Independent with assistive device(s)      Comments: used cane PTA   PT Goals (current goals can now be found in the care plan section) Acute Rehab PT Goals Patient Stated Goal: go to rehab Progress towards PT goals: Progressing toward goals    Frequency  7X/week  PT Plan Current plan remains appropriate    Co-evaluation             End of Session Equipment Utilized During Treatment: Gait belt;Oxygen Activity Tolerance: Patient limited by fatigue Patient left: with call bell/phone within reach;in chair     Time: 0925-0950 PT Time Calculation  (min) (ACUTE ONLY): 25 min  Charges:  $Gait Training: 23-37 mins                    G Codes:      Kollyn Lingafelter,KATHrine E 11/16/2014, 12:46 PM Carmelia Bake, PT, DPT 11/16/14 Pager: [phone]

## 2014-11-03 LAB — CBC
HCT: 28.1 % — ABNORMAL LOW (ref 36.0–46.0)
Hemoglobin: 9 g/dL — ABNORMAL LOW (ref 12.0–15.0)
MCH: 26.6 pg (ref 26.0–34.0)
MCHC: 32 g/dL (ref 30.0–36.0)
MCV: 83.1 fL (ref 78.0–100.0)
Platelets: 217 10*3/uL (ref 150–400)
RBC: 3.38 MIL/uL — ABNORMAL LOW (ref 3.87–5.11)
RDW: 16.5 % — ABNORMAL HIGH (ref 11.5–15.5)
WBC: 11.8 10*3/uL — ABNORMAL HIGH (ref 4.0–10.5)

## 2014-11-03 LAB — BASIC METABOLIC PANEL
Anion gap: 6 (ref 5–15)
BUN: 11 mg/dL (ref 6–20)
CO2: 28 mmol/L (ref 22–32)
Calcium: 8.6 mg/dL — ABNORMAL LOW (ref 8.9–10.3)
Chloride: 105 mmol/L (ref 101–111)
Creatinine, Ser: 0.65 mg/dL (ref 0.44–1.00)
GFR calc Af Amer: >60 mL/min (ref >60)
GFR calc non Af Amer: >60 mL/min (ref >60)
Glucose, Bld: 107 mg/dL — ABNORMAL HIGH (ref 65–99)
Potassium: 3.6 mmol/L (ref 3.5–5.1)
Sodium: 139 mmol/L (ref 135–145)

## 2014-11-03 NOTE — Progress Notes (Signed)
Physical Therapy Treatment Note    11/03/14 1600  PT Visit Information  Last PT Received On 11/03/14  Assistance Needed +1  History of Present Illness Pt is a 79 year old female s/p R posterior approach THA with hx of spinal stenosis, DDD, COPD, and chronic oxygen use  PT Time Calculation  PT Start Time (ACUTE ONLY) 1344  PT Stop Time (ACUTE ONLY) 1405  PT Time Calculation (min) (ACUTE ONLY) 21 min  Subjective Data  Subjective Pt able to tolerate ambulation better this afternoon however required assist to steady upon standing from recliner. Pt continues to require cues during mobilization for precautions as well. Pt uncertain of d/c plan however aware that if home, recommended daughter be present for session tomorrow for education.  Precautions  Precautions Posterior Hip;Fall  Precaution Comments continues to require verbal cues for precautions during mobility  Restrictions  Weight Bearing Restrictions Yes  RLE Weight Bearing PWB  RLE Partial Weight Bearing Percentage or Pounds 50%  Pain Assessment  Pain Assessment 0-10  Pain Score 6  Pain Location R hip  Pain Descriptors / Indicators Aching;Sore  Pain Intervention(s) Limited activity within patient's tolerance;Monitored during session;Repositioned  Cognition  Arousal/Alertness Awake/alert  Behavior During Therapy [facility] for tasks assessed/performed  Memory Decreased recall of precautions  Bed Mobility  Overal bed mobility Needs Assistance  Bed Mobility Sit to Supine  Sit to supine Min assist  General bed mobility comments verbal cues for self assist however required support for R LE due to pain  Transfers  Overall transfer level Needs assistance  Equipment used Rolling walker (2 wheeled)  Transfers Sit to/from Stand  Sit to Stand Min assist  General transfer comment verbal cues for safe technique and precautions, assist to steady due to LOB after rise  Ambulation/Gait  Ambulation/Gait assistance Min guard  Ambulation  Distance (Feet) 80 Feet  Assistive device Rolling walker (2 wheeled)  Gait Pattern/deviations Step-to pattern;Antalgic  General Gait Details verbal cues for maintaining precautions and PWB, maintaining 2L O2 [place]  Stairs Yes  Stairs assistance Min guard  Stair Management Step to pattern;Forwards  Number of Stairs 1  General stair comments pt reports her steps at home will fit entire RW so practiced one step, verbal cues for safety, sequence  PT - End of Session  Equipment Utilized During Treatment Oxygen  Activity Tolerance Patient limited by pain  Patient left in bed;with call bell/phone within reach  PT - Assessment/Plan  PT Plan Current plan remains appropriate  PT Frequency (ACUTE ONLY) 7X/week  Follow Up Recommendations Supervision/Assistance - 24 hour;SNF  PT equipment Rolling walker with 5" wheels  PT Goal Progression  Progress towards PT goals Progressing toward goals  PT General Charges  $$ ACUTE PT VISIT 1 Procedure  PT Treatments  $Gait Training 8-22 mins   Carmelia Bake, PT, DPT 11/03/2014 Pager: [phone]

## 2014-11-03 NOTE — Progress Notes (Signed)
Occupational Therapy Treatment Patient Details Name: Priscilla Cantrell MRN: [id number] DOB: 1935-05-18 Today's Date: 11/03/2014    History of present illness Pt is a 79 year old female s/p R posterior approach THA with hx of spinal stenosis, DDD, COPD, and chronic oxygen use   OT comments  Patient progressing slowly towards OT goals, will continue plan of care for now. Pt requires up to max cueing for safety and use of AE/DME. Pt will need 24/7 supervision/assistance post acute d/c. Feel patient will benefit from post acute rehab prior to going home for her safety, to increase independence and quality of life. Pt reports she has family that can check on her intermittently, no one that can stay with her 24/7.    Follow Up Recommendations  SNF;Supervision/Assistance - 24 hour    Equipment Recommendations  Other (comment) (AE - reacher, sock aid, LH sponge, LH shoe horn; all other TBD next venue of care)    Recommendations for Other Services  None at this time   Precautions / Restrictions Precautions Precautions: Posterior Hip;Fall Precaution Comments: Pt only able to recall 1/3 precautions  Restrictions Weight Bearing Restrictions: Yes RLE Weight Bearing: Partial weight bearing RLE Partial Weight Bearing Percentage or Pounds: 50%    Mobility Bed Mobility General bed mobility comments: Pt found seated in recliner upon OT entering/exiting room  Transfers Overall transfer level: Needs assistance Equipment used: Rolling walker (2 wheeled) Transfers: Sit to/from Stand Sit to Stand: Min assist General transfer comment: verbal cues for safe technique, precautions, & hand placement; assist to rise and steady    Balance Overall balance assessment: Needs assistance Sitting-balance support: No upper extremity supported;Feet supported Sitting balance-Leahy Scale: Fair     Standing balance support: Bilateral upper extremity supported;During functional activity Standing balance-Leahy Scale:  Fair   ADL Overall ADL's : Needs assistance/impaired General ADL Comments: Pt found seated in recliner. Introduced, educated, demonstrated, pt return demonstrate use of AE to increase independence with LB ADLs. Pt required max cueing to use AE. Reiterated importance of adhering to posterior hip precautions and PWB>RLE. [name] 02 sats were low on 2 L/min supplemental after activity (as low as 79%). Encouraged seated rest break and pursed lip breathing. Took ~5 minutes on 2 L/min supplemental for sats to increase back to greater than 90%. Pt continues to feel that she needs rehab prior to home, this therapist agrees as pt does not have 24/7 supervision post acute d/c; she will need 24/7.      Cognition   Behavior During Therapy: WFL for tasks assessed/performed Overall Cognitive Status: Impaired/Different from baseline Area of Impairment: Following commands;Awareness     Memory: Decreased short-term memory;Decreased recall of precautions  Following Commands: Follows one step commands with increased time   Awareness: Emergent   General Comments: slow processing, requring up to max cueing for use of AE and safe use of DME                  Pertinent Vitals/ Pain       Pain Assessment: Faces Faces Pain Scale: Hurts even more Pain Location: R hip with movement  Pain Descriptors / Indicators: Sore;Aching Pain Intervention(s): Limited activity within patient's tolerance;Monitored during session;Repositioned   Frequency Min 2X/week     Progress Toward Goals  OT Goals(current goals can now befound in the care plan section)  Progress towards OT goals: Progressing toward goals (slow progression)     Plan Discharge plan remains appropriate    End of Session Equipment Utilized  During Treatment: Gait belt;Rolling walker;Oxygen   Activity Tolerance Patient tolerated treatment well   Patient Left in chair;with call bell/phone within reach;with family/visitor present    Time:  1110-1134 OT Time Calculation (min): 24 min  Charges: OT General Charges $OT Visit: 1 Procedure OT Treatments $Self Care/Home Management : 23-37 mins  Priscilla Cantrell , MS, OTR/L, CLT Pager: [phone]  11/03/2014, 12:17 PM

## 2014-11-03 NOTE — Progress Notes (Signed)
Physical Therapy Treatment Patient Details Name: Priscilla Cantrell MRN: [id number] DOB: 1935-06-14 Today's Date: 11/03/2014    History of Present Illness Pt is a 79 year old female s/p R posterior approach THA with hx of spinal stenosis, DDD, COPD, and chronic oxygen use    PT Comments    Pt assisted with ambulation however short distance due to pain and fatigue.  Pt continues to require cues for precautions during activity.  Pt also performed LE exercises.  Follow Up Recommendations  Supervision/Assistance - 24 hour;SNF     Equipment Recommendations  Rolling walker with 5" wheels    Recommendations for Other Services       Precautions / Restrictions Precautions Precautions: Posterior Hip;Fall Precaution Comments: Pt only able to recall 1/3 precautions, reviewed during physical activity Restrictions Weight Bearing Restrictions: Yes RLE Weight Bearing: Partial weight bearing RLE Partial Weight Bearing Percentage or Pounds: 50%    Mobility  Bed Mobility Overal bed mobility: Needs Assistance Bed Mobility: Supine to Sit     Supine to sit: Min guard     General bed mobility comments: verbal cues for self assist using sheet, better technique exiting to R side of bed  Transfers Overall transfer level: Needs assistance Equipment used: Rolling walker (2 wheeled) Transfers: Sit to/from Stand Sit to Stand: Min assist         General transfer comment: verbal cues for safe technique and precautions, assist to rise and steady  Ambulation/Gait Ambulation/Gait assistance: Min guard Ambulation Distance (Feet): 30 Feet Assistive device: Rolling walker (2 wheeled) Gait Pattern/deviations: Step-to pattern;Antalgic     General Gait Details: verbal cues for maintaining precautions and PWB, maintaining 2L O2 [place], distance limited today due to fatigue and pain   Stairs            Wheelchair Mobility    Modified Rankin (Stroke Patients Only)       Balance Overall  balance assessment: Needs assistance Sitting-balance support: No upper extremity supported;Feet supported Sitting balance-Leahy Scale: Fair     Standing balance support: Bilateral upper extremity supported;During functional activity Standing balance-Leahy Scale: Fair                      Cognition Arousal/Alertness: Awake/alert Behavior During Therapy: WFL for tasks assessed/performed Overall Cognitive Status: Impaired/Different from baseline Area of Impairment: Following commands;Awareness     Memory: Decreased recall of precautions Following Commands: Follows one step commands with increased time   Awareness: Emergent   General Comments: slow processing, requring up to max cueing for use of AE and safe use of DME     Exercises Total Joint Exercises Ankle Circles/Pumps: AROM;Both;10 reps Quad Sets: AROM;Both;10 reps Short Arc Quad: AROM;Right;10 reps Heel Slides: AAROM;Right;10 reps (within precautions) Hip ABduction/ADduction: Right;AAROM;10 reps    General Comments        Pertinent Vitals/Pain Pain Assessment: 0-10 Pain Score: 7  Faces Pain Scale: Hurts even more Pain Location: R hip Pain Descriptors / Indicators: Aching;Sore Pain Intervention(s): Limited activity within patient's tolerance;Monitored during session;Premedicated before session;Repositioned    Home Living                      Prior Function            PT Goals (current goals can now be found in the care plan section) Progress towards PT goals: Progressing toward goals    Frequency  7X/week    PT Plan Current plan remains appropriate  Co-evaluation             End of Session Equipment Utilized During Treatment: Oxygen Activity Tolerance: Patient limited by fatigue;Patient limited by pain Patient left: in chair;with call bell/phone within reach     Time: 0939-1009 PT Time Calculation (min) (ACUTE ONLY): 30 min  Charges:  $Gait Training: 8-22  mins $Therapeutic Exercise: 8-22 mins                    G Codes:      Teancum Brule,KATHrine E 11/16/2014, 1:05 PM Carmelia Bake, PT, DPT 11-16-14 Pager: [phone]

## 2014-11-03 NOTE — Progress Notes (Signed)
9/29, PT recommending SNF for patient following surgery.    Family saying they have no one during the day to take care of their mother. There is only one daughter, who works 2 jobs, and can only be around at night time.   Patient states there are "some people" who may be able to check on her during the day.   I am finding all this out right now speaking with the patient and her daughter.   Daughter states that she was "counting on" her mother going to rehab before going home.  Daughter also states that she was never informed about how "all this stuff with a hip replacement" works, that her mother never told her, and that she also has never been to a doctors appointment with her mother before the surgery was planned.   9/30, PT still recommending SNF placement for patient.   RN will communicate with PA/MD in the morning.

## 2014-11-03 NOTE — Progress Notes (Signed)
CSW spoke with [facility] from [place] today. D/c plan confirmed. Pt will return home with [facility] services and  assistance from family. RNCM reports pt is comfortable with this d/c plan.  Werner Lean LCSW [phone]

## 2014-11-03 NOTE — Progress Notes (Signed)
After discussion with the patient, we are going to see how the day goes. We anticipate that she will make progress with therapy. She is concerned about going home because her help will be "in and out". Her daughter works but not on the weekend. We are going to discuss with her the possibility of going home, having her daughter be with her over the weekend, and arrange a CNA on Monday and Tuesday while her daughter is at work. Nurse CM will discuss this option with the patient.    Ardeen Jourdain, PA-C

## 2014-11-03 NOTE — Progress Notes (Signed)
Subjective: 2 Days Post-Op Procedure(s) (LRB): RIGHT TOTAL HIP ARTHROPLASTY (Right) Patient reports pain as 2 on 0-10 scale and 3 on 0-10 scale.She does not feel secure enough to go home. Will have Education officer, museum evaluate for SNF.    Objective: Vital signs in last 24 hours: Temp:  [98.3 F (36.8 C)-98.4 F (36.9 C)] 98.3 F (36.8 C) (09/30 0536) Pulse Rate:  [77-79] 77 (09/30 0536) Resp:  [16] 16 (09/30 0536) BP: (108-120)/(46-55) 120/55 mmHg (09/30 0536) SpO2:  [85 %-96 %] 92 % (09/30 0536)  Intake/Output from previous day: 09/29 0701 - 09/30 0700 In: 1420 [P.O.:1420] Out: 1300 [Urine:1300] Intake/Output this shift:     Recent Labs  11/02/14 0515 11/03/14 0525  HGB 9.4* 9.0*    Recent Labs  11/02/14 0515 11/03/14 0525  WBC 13.1* 11.8*  RBC 3.70* 3.38*  HCT 30.4* 28.1*  PLT 234 217    Recent Labs  11/02/14 0515 11/03/14 0525  NA 139 139  K 4.1 3.6  CL 107 105  CO2 27 28  BUN 11 11  CREATININE 0.60 0.65  GLUCOSE 123* 107*  CALCIUM 8.7* 8.6*   No results for input(s): LABPT, INR in the last 72 hours.  Dorsiflexion/Plantar flexion intact No cellulitis present  Assessment/Plan: 2 Days Post-Op Procedure(s) (LRB): RIGHT TOTAL HIP ARTHROPLASTY (Right) Up with therapy Discharge to SNF  Priscilla Cantrell A 11/03/2014, 7:02 AM

## 2014-11-04 DIAGNOSIS — M21251 Flexion deformity, right hip: Secondary | ICD-10-CM | POA: Diagnosis not present

## 2014-11-04 DIAGNOSIS — K5901 Slow transit constipation: Secondary | ICD-10-CM | POA: Diagnosis not present

## 2014-11-04 DIAGNOSIS — R2681 Unsteadiness on feet: Secondary | ICD-10-CM | POA: Diagnosis not present

## 2014-11-04 DIAGNOSIS — R278 Other lack of coordination: Secondary | ICD-10-CM | POA: Diagnosis not present

## 2014-11-04 DIAGNOSIS — R600 Localized edema: Secondary | ICD-10-CM | POA: Diagnosis not present

## 2014-11-04 DIAGNOSIS — R5381 Other malaise: Secondary | ICD-10-CM | POA: Diagnosis not present

## 2014-11-04 DIAGNOSIS — J449 Chronic obstructive pulmonary disease, unspecified: Secondary | ICD-10-CM | POA: Diagnosis not present

## 2014-11-04 DIAGNOSIS — Z471 Aftercare following joint replacement surgery: Secondary | ICD-10-CM | POA: Diagnosis not present

## 2014-11-04 DIAGNOSIS — D62 Acute posthemorrhagic anemia: Secondary | ICD-10-CM | POA: Diagnosis not present

## 2014-11-04 DIAGNOSIS — E039 Hypothyroidism, unspecified: Secondary | ICD-10-CM | POA: Diagnosis not present

## 2014-11-04 DIAGNOSIS — M1611 Unilateral primary osteoarthritis, right hip: Secondary | ICD-10-CM | POA: Diagnosis not present

## 2014-11-04 DIAGNOSIS — M6281 Muscle weakness (generalized): Secondary | ICD-10-CM | POA: Diagnosis not present

## 2014-11-04 DIAGNOSIS — K219 Gastro-esophageal reflux disease without esophagitis: Secondary | ICD-10-CM | POA: Diagnosis not present

## 2014-11-04 DIAGNOSIS — I10 Essential (primary) hypertension: Secondary | ICD-10-CM | POA: Diagnosis not present

## 2014-11-04 DIAGNOSIS — Z96641 Presence of right artificial hip joint: Secondary | ICD-10-CM | POA: Diagnosis not present

## 2014-11-04 LAB — CBC
HCT: 27.5 % — ABNORMAL LOW (ref 36.0–46.0)
Hemoglobin: 8.8 g/dL — ABNORMAL LOW (ref 12.0–15.0)
MCH: 26.1 pg (ref 26.0–34.0)
MCHC: 32 g/dL (ref 30.0–36.0)
MCV: 81.6 fL (ref 78.0–100.0)
Platelets: 212 10*3/uL (ref 150–400)
RBC: 3.37 MIL/uL — ABNORMAL LOW (ref 3.87–5.11)
RDW: 16.4 % — ABNORMAL HIGH (ref 11.5–15.5)
WBC: 10.6 10*3/uL — ABNORMAL HIGH (ref 4.0–10.5)

## 2014-11-04 MED ORDER — FERROUS SULFATE 325 (65 FE) MG PO TABS: 325.0000 mg | ORAL_TABLET | Freq: Two times a day (BID) | ORAL | Status: DC

## 2014-11-04 NOTE — Discharge Summary (Signed)
Physician Discharge Summary   Patient ID: BLESSYN SOMMERVILLE MRN: [id number] DOB/AGE: [birth year] 77 y.o.  Admit date: 11/01/2014 Discharge date: 11/04/2014  Primary Diagnosis: Primary osteoarthritis, right hip   Admission Diagnoses:  Past Medical History  Diagnosis Date  . Hypertension     EKG 03/2010 normal  . COPD (chronic obstructive pulmonary disease)     spirometry 01/10/09 FEV 0.97(52%), FEV1% 47  . Hypoxemia     O2 86% RA with exertion 01/10/09, 2 liters oxygen with exertion and sleep  . Pulmonary nodule     CT chest 12/10, June 2011.  No nodule on CT 06/2010.  Marland Kitchen Spinal stenosis     Symptomatic (neurogenic claudication)  . GERD (gastroesophageal reflux disease)   . Hypothyroidism     Hashimoto's, dx'd 1989  . Diverticulosis of colon     Procto '97 and colonoscopy 2002  . DDD (degenerative disc disease), cervical     1998 MRI C5-6 impingemt (left)  . DDD (degenerative disc disease), lumbar   . Spinal stenosis of lumbar region with neurogenic claudication     2008 MRI (L3-4, L4-5, L5-S1)  . Fibrocystic breast disease     w/fibroadenoma.  Bx's showed NO atypia (Dr. Margot Chimes).  Mammo neg 2008.  Marland Kitchen Chest pain, non-cardiac     Cardiac CT showed no signif obstructive dz, mildly elevated calcium level (Dr. [name]).  . History of double vision     Ophthalmologist, Dr. Herbert Deaner, is further evaluating this with MRI  orbits and limited brain (myesthenia gravis testing neg 07/2010)  . Depression with anxiety 04/15/2011  . Asthma   . Dysplastic nevus of upper extremity 04/2014    R tricep ([name])  . Osteoarthritis, hip, bilateral     End stage; plan for R THA w/[place] [facility] as of 09/22/14  . History of home oxygen therapy     uses 2 liters at hs  . Family history of adverse reaction to anesthesia     mother died when patient age 82 receiving anesthesia for thyroid surgery   Discharge Diagnoses:   Active Problems:   H/O total hip arthroplasty  Estimated body mass index is 27.22  kg/(m^2) as calculated from the following:   Height as of this encounter: _0  (1.549 m).   Weight as of this encounter: 65.318 kg (144 lb).  Procedure(s) (LRB): RIGHT TOTAL HIP ARTHROPLASTY (Right)   Consults: None  HPI: Priscilla Cantrell, 79 y.o. female, has a history of pain and functional disability in the right hip(s) due to arthritis and patient has failed non-surgical conservative treatments for greater than 12 weeks to include NSAID's and/or analgesics, use of assistive devices and activity modification. Onset of symptoms was gradual starting 3 years ago with gradually worsening course since that time.The patient noted no past surgery on the right hip(s). Patient currently rates pain in the right hip at 7 out of 10 with activity. Patient has night pain, worsening of pain with activity and weight bearing, pain that interfers with activities of daily living, pain with passive range of motion and crepitus. Patient has evidence of periarticular osteophytes and joint space narrowing by imaging studies. This condition presents safety issues increasing the risk of falls. There is no current active infection.  Laboratory Data: Admission on 11/01/2014  Component Date Value Ref Range Status  . WBC 11/02/2014 13.1* 4.0 - 10.5 K/uL Final  . RBC 11/02/2014 3.70* 3.87 - 5.11 MIL/uL Final  . Hemoglobin 11/02/2014 9.4* 12.0 - 15.0 g/dL Final  .  HCT 11/02/2014 30.4* 36.0 - 46.0 % Final  . MCV 11/02/2014 82.2  78.0 - 100.0 fL Final  . MCH 11/02/2014 25.4* 26.0 - 34.0 pg Final  . MCHC 11/02/2014 30.9  30.0 - 36.0 g/dL Final  . RDW 11/02/2014 16.2* 11.5 - 15.5 % Final  . Platelets 11/02/2014 234  150 - 400 K/uL Final  . Sodium 11/02/2014 139  135 - 145 mmol/L Final  . Potassium 11/02/2014 4.1  3.5 - 5.1 mmol/L Final  . Chloride 11/02/2014 107  101 - 111 mmol/L Final  . CO2 11/02/2014 27  22 - 32 mmol/L Final  . Glucose, Bld 11/02/2014 123* 65 - 99 mg/dL Final  . BUN 11/02/2014 11  6 - 20 mg/dL  Final  . Creatinine, Ser 11/02/2014 0.60  0.44 - 1.00 mg/dL Final  . Calcium 11/02/2014 8.7* 8.9 - 10.3 mg/dL Final  . GFR calc non Af Amer 11/02/2014 >60  >60 mL/min Final  . GFR calc Af Amer 11/02/2014 >60  >60 mL/min Final   Comment: (NOTE) The eGFR has been calculated using the CKD EPI equation. This calculation has not been validated in all clinical situations. eGFR's persistently <60 mL/min signify possible Chronic Kidney Disease.   . Anion gap 11/02/2014 5  5 - 15 Final  . WBC 11/03/2014 11.8* 4.0 - 10.5 K/uL Final  . RBC 11/03/2014 3.38* 3.87 - 5.11 MIL/uL Final  . Hemoglobin 11/03/2014 9.0* 12.0 - 15.0 g/dL Final  . HCT 11/03/2014 28.1* 36.0 - 46.0 % Final  . MCV 11/03/2014 83.1  78.0 - 100.0 fL Final  . MCH 11/03/2014 26.6  26.0 - 34.0 pg Final  . MCHC 11/03/2014 32.0  30.0 - 36.0 g/dL Final  . RDW 11/03/2014 16.5* 11.5 - 15.5 % Final  . Platelets 11/03/2014 217  150 - 400 K/uL Final  . Sodium 11/03/2014 139  135 - 145 mmol/L Final  . Potassium 11/03/2014 3.6  3.5 - 5.1 mmol/L Final  . Chloride 11/03/2014 105  101 - 111 mmol/L Final  . CO2 11/03/2014 28  22 - 32 mmol/L Final  . Glucose, Bld 11/03/2014 107* 65 - 99 mg/dL Final  . BUN 11/03/2014 11  6 - 20 mg/dL Final  . Creatinine, Ser 11/03/2014 0.65  0.44 - 1.00 mg/dL Final  . Calcium 11/03/2014 8.6* 8.9 - 10.3 mg/dL Final  . GFR calc non Af Amer 11/03/2014 >60  >60 mL/min Final  . GFR calc Af Amer 11/03/2014 >60  >60 mL/min Final   Comment: (NOTE) The eGFR has been calculated using the CKD EPI equation. This calculation has not been validated in all clinical situations. eGFR's persistently <60 mL/min signify possible Chronic Kidney Disease.   . Anion gap 11/03/2014 6  5 - 15 Final  . WBC 11/04/2014 10.6* 4.0 - 10.5 K/uL Final  . RBC 11/04/2014 3.37* 3.87 - 5.11 MIL/uL Final  . Hemoglobin 11/04/2014 8.8* 12.0 - 15.0 g/dL Final  . HCT 11/04/2014 27.5* 36.0 - 46.0 % Final  . MCV 11/04/2014 81.6  78.0 - 100.0 fL  Final  . MCH 11/04/2014 26.1  26.0 - 34.0 pg Final  . MCHC 11/04/2014 32.0  30.0 - 36.0 g/dL Final  . RDW 11/04/2014 16.4* 11.5 - 15.5 % Final  . Platelets 11/04/2014 212  150 - 400 K/uL Final  Hospital Outpatient Visit on 10/26/2014  Component Date Value Ref Range Status  . WBC 10/26/2014 10.7* 4.0 - 10.5 K/uL Final  . RBC 10/26/2014 4.85  3.87 - 5.11 MIL/uL  Final  . Hemoglobin 10/26/2014 12.6  12.0 - 15.0 g/dL Final  . HCT 10/26/2014 39.7  36.0 - 46.0 % Final  . MCV 10/26/2014 81.9  78.0 - 100.0 fL Final  . MCH 10/26/2014 26.0  26.0 - 34.0 pg Final  . MCHC 10/26/2014 31.7  30.0 - 36.0 g/dL Final  . RDW 10/26/2014 15.5  11.5 - 15.5 % Final  . Platelets 10/26/2014 350  150 - 400 K/uL Final  . Neutrophils Relative % 10/26/2014 71   Final  . Neutro Abs 10/26/2014 7.7  1.7 - 7.7 K/uL Final  . Lymphocytes Relative 10/26/2014 18   Final  . Lymphs Abs 10/26/2014 1.9  0.7 - 4.0 K/uL Final  . Monocytes Relative 10/26/2014 9   Final  . Monocytes Absolute 10/26/2014 1.0  0.1 - 1.0 K/uL Final  . Eosinophils Relative 10/26/2014 2   Final  . Eosinophils Absolute 10/26/2014 0.2  0.0 - 0.7 K/uL Final  . Basophils Relative 10/26/2014 0   Final  . Basophils Absolute 10/26/2014 0.0  0.0 - 0.1 K/uL Final  . Sodium 10/26/2014 140  135 - 145 mmol/L Final  . Potassium 10/26/2014 4.1  3.5 - 5.1 mmol/L Final  . Chloride 10/26/2014 105  101 - 111 mmol/L Final  . CO2 10/26/2014 26  22 - 32 mmol/L Final  . Glucose, Bld 10/26/2014 90  65 - 99 mg/dL Final  . BUN 10/26/2014 14  6 - 20 mg/dL Final  . Creatinine, Ser 10/26/2014 0.73  0.44 - 1.00 mg/dL Final  . Calcium 10/26/2014 9.0  8.9 - 10.3 mg/dL Final  . Total Protein 10/26/2014 7.4  6.5 - 8.1 g/dL Final  . Albumin 10/26/2014 4.1  3.5 - 5.0 g/dL Final  . AST 10/26/2014 19  15 - 41 U/L Final  . ALT 10/26/2014 14  14 - 54 U/L Final  . Alkaline Phosphatase 10/26/2014 98  38 - 126 U/L Final  . Total Bilirubin 10/26/2014 0.4  0.3 - 1.2 mg/dL Final  . GFR  calc non Af Amer 10/26/2014 >60  >60 mL/min Final  . GFR calc Af Amer 10/26/2014 >60  >60 mL/min Final   Comment: (NOTE) The eGFR has been calculated using the CKD EPI equation. This calculation has not been validated in all clinical situations. eGFR's persistently <60 mL/min signify possible Chronic Kidney Disease.   . Anion gap 10/26/2014 9  5 - 15 Final  . Prothrombin Time 10/26/2014 12.6  11.6 - 15.2 seconds Final  . INR 10/26/2014 0.92  0.00 - 1.49 Final  . ABO/RH(D) 10/26/2014 O POS   Final  . Antibody Screen 10/26/2014 NEG   Final  . Sample Expiration 10/26/2014 11/04/2014   Final  . Color, Urine 10/26/2014 YELLOW  YELLOW Final  . APPearance 10/26/2014 CLEAR  CLEAR Final  . Specific Gravity, Urine 10/26/2014 1.006  1.005 - 1.030 Final  . pH 10/26/2014 6.0  5.0 - 8.0 Final  . Glucose, UA 10/26/2014 NEGATIVE  NEGATIVE mg/dL Final  . Hgb urine dipstick 10/26/2014 NEGATIVE  NEGATIVE Final  . Bilirubin Urine 10/26/2014 NEGATIVE  NEGATIVE Final  . Ketones, ur 10/26/2014 NEGATIVE  NEGATIVE mg/dL Final  . Protein, ur 10/26/2014 NEGATIVE  NEGATIVE mg/dL Final  . Urobilinogen, UA 10/26/2014 0.2  0.0 - 1.0 mg/dL Final  . Nitrite 10/26/2014 NEGATIVE  NEGATIVE Final  . Leukocytes, UA 10/26/2014 TRACE* NEGATIVE Final  . aPTT 10/26/2014 29  24 - 37 seconds Final  . MRSA, PCR 10/26/2014 NEGATIVE  NEGATIVE Final  .  Staphylococcus aureus 10/26/2014 NEGATIVE  NEGATIVE Final   Comment:        The Xpert SA Assay (FDA approved for NASAL specimens in patients over 51 years of age), is one component of a comprehensive surveillance program.  Test performance has been validated by [facility] for patients greater than or equal to 39 year old. It is not intended to diagnose infection nor to guide or monitor treatment.   . Squamous Epithelial / LPF 10/26/2014 RARE  RARE Final  . WBC, UA 10/26/2014 0-2  <3 WBC/hpf Final  . ABO/RH(D) 10/26/2014 O POS   Final     X-Rays:Dg Chest 2  View  10/26/2014   CLINICAL DATA:  Asthma. COPD. Preoperative for right total hip replacement.  EXAM: CHEST  2 VIEW  COMPARISON:  06/18/2010  FINDINGS: Emphysema. Mildly tortuous thoracic aorta with mild atherosclerotic calcification of the arch. Mild enlargement of the cardiopericardial silhouette. No edema.  IMPRESSION: 1. Severe emphysema. 2. Mild enlargement of the cardiopericardial silhouette, without edema.   Electronically Signed   By: Van Clines M.D.   On: 10/26/2014 15:36   Dg Hip Port Unilat With Pelvis 1v Right  11/01/2014   CLINICAL DATA:  Post right total hip replacement.  EXAM: DG HIP (WITH OR WITHOUT PELVIS) 1V PORT RIGHT  COMPARISON:  None.  FINDINGS: Changes of right hip replacement. No hardware or bony complicating feature. Normal AP alignment.  IMPRESSION: Right hip replacement.  No complicating feature.   Electronically Signed   By: Rolm Baptise M.D.   On: 11/01/2014 10:00    EKG: Orders placed or performed during the hospital encounter of 10/26/14  . EKG  . EKG     Hospital Course: Patient was admitted to [facility] and taken to the OR and underwent the above state procedure without complications.  Patient tolerated the procedure well and was later transferred to the recovery room and then to the orthopaedic floor for postoperative care.  They were given PO and IV analgesics for pain control following their surgery.  They were given 24 hours of postoperative antibiotics of  Anti-infectives    Start     Dose/Rate Route Frequency Ordered Stop   11/01/14 1800  vancomycin (VANCOCIN) IVPB 1000 mg/200 mL premix     1,000 mg 200 mL/hr over 60 Minutes Intravenous Every 12 hours 11/01/14 1145 11/01/14 1908   11/01/14 0806  polymyxin B 500,000 Units, bacitracin 50,000 Units in sodium chloride irrigation 0.9 % 500 mL irrigation  Status:  Discontinued       As needed 11/01/14 0807 11/01/14 0915   11/01/14 0542  vancomycin (VANCOCIN) IVPB 1000 mg/200 mL premix      1,000 mg 200 mL/hr over 60 Minutes Intravenous On call to O.R. 11/01/14 0539 11/01/14 0726     and started on DVT prophylaxis in the form of Xarelto.   PT and OT were ordered for total hip protocol.  The patient was allowed to be PWB 50% with therapy. Discharge planning was consulted to help with postop disposition and equipment needs.  Patient had a fair night on the evening of surgery.  They started to get up OOB with therapy on day one.  Continued to work with therapy into day two but was progressing very slowly.  By day three, the patient had continued to meet goal with therapy and family became unavailable to assist her at home. Decision was made to change disposition to SNF and DC to SNF when bed available.  Diet: Cardiac diet Activity:PWB No bending hip over 90 degrees- A "L" Angle Do not cross legs Do not let foot roll inward When turning these patients a pillow should be placed between the patient's legs to prevent crossing. Patients should have the affected knee fully extended when trying to sit or stand from all surfaces to prevent excessive hip flexion. When ambulating and turning toward the affected side the affected leg should have the toes turned out prior to moving the walker and the rest of patient's body as to prevent internal rotation/ turning in of the leg. Abduction pillows are the most effective way to prevent a patient from not crossing legs or turning toes in at rest. If an abduction pillow is not ordered placing a regular pillow length wise between the patient's legs is also an effective reminder. It is imperative that these precautions be maintained so that the surgical hip does not dislocate. Follow-up:in 2 weeks Disposition - Skilled nursing facility Discharged Condition: stable   Discharge Instructions    Call MD / Call 911    Complete by:  As directed   If you experience chest pain or shortness of breath, CALL 911 and be transported to the hospital emergency  room.  If you develope a fever above 101 F, pus (white drainage) or increased drainage or redness at the wound, or calf pain, call your surgeon's office.     Constipation Prevention    Complete by:  As directed   Drink plenty of fluids.  Prune juice may be helpful.  You may use a stool softener, such as Colace (over the counter) 100 mg twice a day.  Use MiraLax (over the counter) for constipation as needed.     Diet - low sodium heart healthy    Complete by:  As directed      Discharge instructions    Complete by:  As directed   INSTRUCTIONS AFTER JOINT REPLACEMENT   Remove items at home which could result in a fall. This includes throw rugs or furniture in walking pathways ICE to the affected joint every three hours while awake for 30 minutes at a time, for at least the first 3-5 days, and then as needed for pain and swelling.  Continue to use ice for pain and swelling. You may notice swelling that will progress down to the foot and ankle.  This is normal after surgery.  Elevate your leg when you are not up walking on it.   Continue to use the breathing machine you got in the hospital (incentive spirometer) which will help keep your temperature down.  It is common for your temperature to cycle up and down following surgery, especially at night when you are not up moving around and exerting yourself.  The breathing machine keeps your lungs expanded and your temperature down.   DIET:  As you were doing prior to hospitalization, we recommend a well-balanced diet.  DRESSING / WOUND CARE / SHOWERING  Keep the surgical dressing until follow up.  The dressing is water proof, so you can shower without any extra covering.  IF THE DRESSING FALLS OFF or the wound gets wet inside, change the dressing with sterile gauze.  Please use good hand washing techniques before changing the dressing.  Do not use any lotions or creams on the incision until instructed by your surgeon.    ACTIVITY  Increase activity  slowly as tolerated, but follow the weight bearing instructions below.   No driving for 6 weeks or  until further direction given by your physician.  You cannot drive while taking narcotics.  No lifting or carrying greater than 10 lbs. until further directed by your surgeon. Avoid periods of inactivity such as sitting longer than an hour when not asleep. This helps prevent blood clots.  You may return to work once you are authorized by your doctor.   WEIGHT BEARING   Partial weight bearing with assist device as directed.  50%    CONSTIPATION  Constipation is defined medically as fewer than three stools per week and severe constipation as less than one stool per week.  Even if you have a regular bowel pattern at home, your normal regimen is likely to be disrupted due to multiple reasons following surgery.  Combination of anesthesia, postoperative narcotics, change in appetite and fluid intake all can affect your bowels.   YOU MUST use at least one of the following options; they are listed in order of increasing strength to get the job done.  They are all available over the counter, and you may need to use some, POSSIBLY even all of these options:    Drink plenty of fluids (prune juice may be helpful) and high fiber foods Colace 100 mg by mouth twice a day  Senokot for constipation as directed and as needed Dulcolax (bisacodyl), take with full glass of water  Miralax (polyethylene glycol) once or twice a day as needed.  If you have tried all these things and are unable to have a bowel movement in the first 3-4 days after surgery call either your surgeon or your primary doctor.    If you experience loose stools or diarrhea, hold the medications until you stool forms back up.  If your symptoms do not get better within 1 week or if they get worse, check with your doctor.  If you experience "the worst abdominal pain ever" or develop nausea or vomiting, please contact the office immediately for  further recommendations for treatment.   ITCHING:  If you experience itching with your medications, try taking only a single pain pill, or even half a pain pill at a time.  You can also use Benadryl over the counter for itching or also to help with sleep.     MEDICATIONS:  See your medication summary on the "After Visit Summary" that nursing will review with you.  You may have some home medications which will be placed on hold until you complete the course of blood thinner medication.  It is important for you to complete the blood thinner medication as prescribed.  PRECAUTIONS:  If you experience chest pain or shortness of breath - call 911 immediately for transfer to the hospital emergency department.   If you develop a fever greater that 101 F, purulent drainage from wound, increased redness or drainage from wound, foul odor from the wound/dressing, or calf pain - CONTACT YOUR SURGEON.                                                   FOLLOW-UP APPOINTMENTS:  If you do not already have a post-op appointment, please call the office for an appointment to be seen by your surgeon.  Guidelines for how soon to be seen are listed in your "After Visit Summary", but are typically between 1-4 weeks after surgery.   MAKE SURE YOU:  Understand  these instructions.  Get help right away if you are not doing well or get worse.    Thank you for letting us be a part of your medical care team.  It is a privilege we respect greatly.  We hope these instructions will help you stay on track for a fast and full recovery!     Follow the hip precautions as taught in Physical Therapy    Complete by:  As directed      Increase activity slowly as tolerated    Complete by:  As directed             Medication List    STOP taking these medications        aspirin 81 MG tablet     azithromycin 250 MG tablet  Commonly known as:  ZITHROMAX     Vitamin D (Ergocalciferol) 50000 UNITS Caps capsule  Commonly  known as:  DRISDOL      TAKE these medications        albuterol 108 (90 BASE) MCG/ACT inhaler  Commonly known as:  PROAIR HFA  Inhale 2 puffs into the lungs every 4 (four) hours as needed.     amLODipine 5 MG tablet  Commonly known as:  NORVASC  Take 1 tablet (5 mg total) by mouth daily.     cetirizine 10 MG tablet  Commonly known as:  ZYRTEC  Take 1 tablet (10 mg total) by mouth daily.     conjugated estrogens vaginal cream  Commonly known as:  PREMARIN  Place vaginally daily.     diazepam 5 MG tablet  Commonly known as:  VALIUM  Take 1 tablet (5 mg total) by mouth every 12 (twelve) hours as needed.     ferrous sulfate 325 (65 FE) MG tablet  Take 1 tablet (325 mg total) by mouth 2 (two) times daily with a meal.     Fluticasone-Salmeterol 250-50 MCG/DOSE Aepb  Commonly known as:  ADVAIR DISKUS  Inhale 1 puff into the lungs 2 (two) times daily.     HYDROcodone-acetaminophen 5-325 MG tablet  Commonly known as:  NORCO/VICODIN  Take 1-2 tablets by mouth every 4 (four) hours as needed (breakthrough pain).     ipratropium 0.03 % nasal spray  Commonly known as:  ATROVENT  2 sprays into each nostril q12h prn     methocarbamol 500 MG tablet  Commonly known as:  ROBAXIN  Take 1 tablet (500 mg total) by mouth every 6 (six) hours as needed for muscle spasms.     omeprazole 20 MG capsule  Commonly known as:  PRILOSEC  Take 20 mg by mouth daily.     RESTASIS 0.05 % ophthalmic emulsion  Generic drug:  cycloSPORINE  Apply 1 drop to eye 2 (two) times daily.     rivaroxaban 10 MG Tabs tablet  Commonly known as:  XARELTO  Take 1 tablet (10 mg total) by mouth daily with breakfast.     roflumilast 500 MCG Tabs tablet  Commonly known as:  DALIRESP  Take 1 tablet by mouth  daily     SPIRIVA HANDIHALER 18 MCG inhalation capsule  Generic drug:  tiotropium  INHALE 1 CAPSULE VIA HANDIHALER EVERY DAY AT THE SAME TIME     SYNTHROID 88 MCG tablet  Generic drug:  levothyroxine   TAKE 1 TABLET BY MOUTH DAILY     venlafaxine 50 MG tablet  Commonly known as:  EFFEXOR  Take 1 tablet (50 mg total) by mouth 2 (two)  times daily.           Follow-up Information    Follow up with GIOFFRE,RONALD A, MD. Schedule an appointment as soon as possible for a visit in 2 weeks.   Specialty:  Orthopedic Surgery   Contact information:   [address] [place] [address] [phone]       Signed: Ardeen Jourdain, PA-C Orthopaedic Surgery 11/04/2014, 9:17 AM

## 2014-11-04 NOTE — Care Management Important Message (Signed)
Important Message  Patient Details  Name: Priscilla Cantrell MRN: [id number] Date of Birth: [birth year]   Medicare Important Message Given:  Yes-second notification given    Erenest Rasher, RN 11/04/2014, 9:50 AM

## 2014-11-04 NOTE — Clinical Social Work Placement (Signed)
   CLINICAL SOCIAL WORK PLACEMENT  NOTE  Date:  11/04/2014  Patient Details  Name: Priscilla Cantrell MRN: [id number] Date of Birth: 1935/11/29  Clinical Social Work is seeking post-discharge placement for this patient at the   level of care (*CSW will initial, date and re-position this form in  chart as items are completed):      Patient/family provided with [facility] Work Department's list of facilities offering this level of care within the geographic area requested by the patient (or if unable, by the patient's family).      Patient/family informed of their freedom to choose among providers that offer the needed level of care, that participate in Medicare, Medicaid or managed care program needed by the patient, have an available bed and are willing to accept the patient.      Patient/family informed of 's ownership interest in [facility] and [facility], as well as of the fact that they are under no obligation to receive care at these facilities.  PASRR submitted to EDS on       PASRR number received on       Existing PASRR number confirmed on       FL2 transmitted to all facilities in geographic area requested by pt/family on       FL2 transmitted to all facilities within larger geographic area on       Patient informed that his/her managed care company has contracts with or will negotiate with certain facilities, including the following:            Patient/family informed of bed offers received.  Patient chooses bed at   [facility]    Physician recommends and patient chooses bed at      Patient to be transferred to  [facility] on  November 04, 2014.  Patient to be transferred to facility by     ambulance  Patient family notified on   November 04, 2014 of transfer.  Name of family member notified:     Susan/daughter   PHYSICIAN       Additional Comment:    _______________________________________________ Carlean Jews,  LCSW 11/04/2014, 1:51 PM

## 2014-11-04 NOTE — Care Management Note (Signed)
Case Management Note  Patient Details  Name: Priscilla Cantrell MRN: [id number] Date of Birth: 06-Dec-1935  Subjective/Objective:        RIGHT TOTAL HIP ARTHROPLASTY             Action/Plan: SNF  Expected Discharge Date:  11/04/2014               Expected Discharge Plan:  Skilled Nursing Facility  In-House Referral:  Clinical Social Work  Discharge planning Services  CM Consult    Status of Service:  Completed, signed off  Medicare Important Message Given:  Yes-second notification given Date Medicare IM Given:    Medicare IM give by:    Date Additional Medicare IM Given:    Additional Medicare Important Message give by:     If discussed at [place] of Stay Meetings, dates discussed:    Additional Comments: NCM received permission to speak to dtr, Lawson Fiscal # [phone]. Dtr requesting SNF placement at [facility]. CSW referral for SNF placement. Notified [facility] agency that plan is for SNF.   Erenest Rasher, RN 11/04/2014, 12:28 PM

## 2014-11-04 NOTE — Progress Notes (Signed)
Subjective: 3 Days Post-Op Procedure(s) (LRB): RIGHT TOTAL HIP ARTHROPLASTY (Right) Patient reports pain as moderate.   Patient seen in rounds for Dr. Gladstone Lighter. Patient is continuing to progress slowly. She reports that her family is no longer able to commit ot being with her at home. No SOB or chest pain. Voiding well. Positive flatus Plan is to go Skilled nursing facility after hospital stay.  Objective: Vital signs in last 24 hours: Temp:  [97.3 F (36.3 C)-100.2 F (37.9 C)] 97.3 F (36.3 C) (10/01 0518) Pulse Rate:  [81-90] 81 (10/01 0838) Resp:  [16-24] 16 (10/01 0838) BP: (98-129)/(44-66) 129/66 mmHg (10/01 0518) SpO2:  [90 %-94 %] 92 % (10/01 0838)  Intake/Output from previous day:  Intake/Output Summary (Last 24 hours) at 11/04/14 0913 Last data filed at 11/04/14 0700  Gross per 24 hour  Intake   2100 ml  Output      0 ml  Net   2100 ml     Labs:  Recent Labs  11/02/14 0515 11/03/14 0525 11/04/14 0455  HGB 9.4* 9.0* 8.8*    Recent Labs  11/03/14 0525 11/04/14 0455  WBC 11.8* 10.6*  RBC 3.38* 3.37*  HCT 28.1* 27.5*  PLT 217 212    Recent Labs  11/02/14 0515 11/03/14 0525  NA 139 139  K 4.1 3.6  CL 107 105  CO2 27 28  BUN 11 11  CREATININE 0.60 0.65  GLUCOSE 123* 107*  CALCIUM 8.7* 8.6*    EXAM General - Patient is Alert and Oriented Extremity - Neurologically intact Intact pulses distally Dorsiflexion/Plantar flexion intact Compartment soft Dressing/Incision - clean, dry, no drainage Motor Function - intact, moving foot and toes well on exam.   Past Medical History  Diagnosis Date  . Hypertension     EKG 03/2010 normal  . COPD (chronic obstructive pulmonary disease)     spirometry 01/10/09 FEV 0.97(52%), FEV1% 47  . Hypoxemia     O2 86% RA with exertion 01/10/09, 2 liters oxygen with exertion and sleep  . Pulmonary nodule     CT chest 12/10, June 2011.  No nodule on CT 06/2010.  Marland Kitchen Spinal stenosis     Symptomatic (neurogenic  claudication)  . GERD (gastroesophageal reflux disease)   . Hypothyroidism     Hashimoto's, dx'd 1989  . Diverticulosis of colon     Procto '97 and colonoscopy 2002  . DDD (degenerative disc disease), cervical     1998 MRI C5-6 impingemt (left)  . DDD (degenerative disc disease), lumbar   . Spinal stenosis of lumbar region with neurogenic claudication     2008 MRI (L3-4, L4-5, L5-S1)  . Fibrocystic breast disease     w/fibroadenoma.  Bx's showed NO atypia (Dr. Margot Chimes).  Mammo neg 2008.  Marland Kitchen Chest pain, non-cardiac     Cardiac CT showed no signif obstructive dz, mildly elevated calcium level (Dr. [name]).  . History of double vision     Ophthalmologist, Dr. Herbert Deaner, is further evaluating this with MRI  orbits and limited brain (myesthenia gravis testing neg 07/2010)  . Depression with anxiety 04/15/2011  . Asthma   . Dysplastic nevus of upper extremity 04/2014    R tricep ([name])  . Osteoarthritis, hip, bilateral     End stage; plan for R THA w/[place] [facility] as of 09/22/14  . History of home oxygen therapy     uses 2 liters at hs  . Family history of adverse reaction to anesthesia     mother  died when patient age 89 receiving anesthesia for thyroid surgery    Assessment/Plan: 3 Days Post-Op Procedure(s) (LRB): RIGHT TOTAL HIP ARTHROPLASTY (Right) Active Problems:   H/O total hip arthroplasty  Estimated body mass index is 27.22 kg/(m^2) as calculated from the following:   Height as of this encounter: 5\' 1"  (1.549 m).   Weight as of this encounter: 65.318 kg (144 lb). Advance diet Up with therapy D/C IV fluids Discharge to SNF  DVT Prophylaxis - Xarelto PWB 50%  Unfortunately she has not been making the improvement that we had hoped she would make. In addition, her family has become unable to be with her at home. For these reasons, it is felt that it will be safer for her to be DC to SNF when bed available.  Ardeen Jourdain, PA-C Orthopaedic Surgery 11/04/2014, 9:13  AM

## 2014-11-04 NOTE — Clinical Social Work Note (Signed)
CSW received a call from RN stating that pt's discharge plans have changed. Pt was going home but now is requiring SNF and is ready for discharge today.  [facility] this [facility].  CSW will prepare paperwork and send information to SNF's for discharge today  .Dede Query, LCSW [facility] Clinical Social Worker - Weekend Coverage cell #: [phone]

## 2014-11-04 NOTE — Progress Notes (Signed)
Physical Therapy Treatment Patient Details Name: Priscilla Cantrell MRN: [id number] DOB: 12-01-1935 Today's Date: 11/04/2014    History of Present Illness Pt is a 79 year old female s/p R posterior approach THA with hx of spinal stenosis, DDD, COPD, and chronic oxygen use    PT Comments    Patient on RA, ambulated x 80', sats 80%. RN aware. Placed on 2 l [place] with sats incr. To 90%. Patient requires frequent cues for safety and PWB.plans SNF today.  Follow Up Recommendations  Supervision/Assistance - 24 hour;SNF     Equipment Recommendations  Rolling walker with 5" wheels    Recommendations for Other Services       Precautions / Restrictions Precautions Precaution Comments: monitor sats, may need O2 Restrictions RLE Weight Bearing: Partial weight bearing RLE Partial Weight Bearing Percentage or Pounds: 50    Mobility  Bed Mobility                  Transfers   Equipment used: Rolling walker (2 wheeled) Transfers: Sit to/from Stand Sit to Stand: Min guard         General transfer comment: verbal cues for safe technique and precautions,   Ambulation/Gait Ambulation/Gait assistance: Min guard Ambulation Distance (Feet): 80 Feet Assistive device: Rolling walker (2 wheeled) Gait Pattern/deviations: Step-to pattern;Step-through pattern     General Gait Details: verbal cues for maintaining precautions and PWB, frequent cues for sequence, tends to take an extra long step with R leg   Stairs            Wheelchair Mobility    Modified Rankin (Stroke Patients Only)       Balance                                    Cognition Arousal/Alertness: Awake/alert           Memory: Decreased recall of precautions Following Commands: Follows one step commands with increased time            Exercises      General Comments        Pertinent Vitals/Pain Pain Assessment: No/denies pain    Home Living                       Prior Function            PT Goals (current goals can now be found in the care plan section) Progress towards PT goals: Progressing toward goals    Frequency  7X/week    PT Plan Current plan remains appropriate    Co-evaluation             End of Session   Activity Tolerance: Patient tolerated treatment well Patient left: in chair;with call bell/phone within reach     Time: 1210-1230 PT Time Calculation (min) (ACUTE ONLY): 20 min  Charges:  $Gait Training: 8-22 mins                    G Codes:      Claretha Cooper 11/04/2014, 1:38 PM

## 2014-11-07 ENCOUNTER — Non-Acute Institutional Stay (SKILLED_NURSING_FACILITY): Payer: Medicare Other | Admitting: Internal Medicine

## 2014-11-07 DIAGNOSIS — D62 Acute posthemorrhagic anemia: Secondary | ICD-10-CM

## 2014-11-07 DIAGNOSIS — J449 Chronic obstructive pulmonary disease, unspecified: Secondary | ICD-10-CM | POA: Diagnosis not present

## 2014-11-07 DIAGNOSIS — E039 Hypothyroidism, unspecified: Secondary | ICD-10-CM

## 2014-11-07 DIAGNOSIS — K219 Gastro-esophageal reflux disease without esophagitis: Secondary | ICD-10-CM | POA: Diagnosis not present

## 2014-11-07 DIAGNOSIS — M1611 Unilateral primary osteoarthritis, right hip: Secondary | ICD-10-CM

## 2014-11-07 DIAGNOSIS — I10 Essential (primary) hypertension: Secondary | ICD-10-CM | POA: Diagnosis not present

## 2014-11-07 DIAGNOSIS — R600 Localized edema: Secondary | ICD-10-CM | POA: Diagnosis not present

## 2014-11-07 DIAGNOSIS — R5381 Other malaise: Secondary | ICD-10-CM

## 2014-11-07 DIAGNOSIS — K5901 Slow transit constipation: Secondary | ICD-10-CM

## 2014-11-07 NOTE — Progress Notes (Signed)
Patient ID: Priscilla Cantrell, female   DOB: 09-26-1935, 79 y.o.   MRN: [id number]      Facility: [facility]    PCP: [name], MD  Code Status: DNR  Allergies  Allergen Reactions  . Losartan Other (See Comments)    hyperkalemia  . Penicillins Hives, Swelling and Rash    Has patient had a PCN reaction causing immediate rash, facial/tongue/throat swelling, SOB or lightheadedness with hypotension: YES Has patient had a PCN reaction causing severe rash involving mucus membranes or skin necrosis: NO Has patient had a PCN reaction that required hospitalization NO Has patient had a PCN reaction occurring within the last 10 years: NO If all of the above answers are "NO", then may proceed with Cephalosporin use.   . Cefixime [Kdc:Cefixime] Other (See Comments)    unspecified  . Indocin [Indomethacin] Other (See Comments)    Painful tongue and throat  . Sulfa Antibiotics Other (See Comments)    unspecified  . Ace Inhibitors Other (See Comments)    cough  . Statins Other (See Comments)    Myalgias     Chief Complaint  Patient presents with  . New Admit To SNF     HPI:  79 y.o. patient is here for short term rehabilitation post hospital admission from 11/01/14-11/04/14 with right hip OA. She underwent right total hip arthroplasty. Her pain is under control with current regimen. Denies muscle spasm. Has been constipated and had bowel movement yesterday after 11/01/14. She feels tired easily and has generalized weakness  Review of Systems:  Constitutional: Negative for fever, chills, diaphoresis.  HENT: Negative for headache, congestion, difficulty swallowing.   Eyes: Negative for eye pain, blurred vision, double vision and discharge.  Respiratory: Negative for cough, wheezing.  has dyspnea with minimal exertion. Has history of copd and on o2 at night at home Cardiovascular: Negative for chest pain, palpitations, leg swelling.  Gastrointestinal:  Negative for heartburn, nausea, vomiting, abdominal pain Genitourinary: Negative for dysuria and flank pain.  Musculoskeletal: Negative for back pain, falls Skin: Negative for itching, rash.  Neurological: Negative for dizziness, tingling, focal weakness Psychiatric/Behavioral: Negative for depression   Past Medical History  Diagnosis Date  . Hypertension     EKG 03/2010 normal  . COPD (chronic obstructive pulmonary disease)     spirometry 01/10/09 FEV 0.97(52%), FEV1% 47  . Hypoxemia     O2 86% RA with exertion 01/10/09, 2 liters oxygen with exertion and sleep  . Pulmonary nodule     CT chest 12/10, June 2011.  No nodule on CT 06/2010.  Marland Kitchen Spinal stenosis     Symptomatic (neurogenic claudication)  . GERD (gastroesophageal reflux disease)   . Hypothyroidism     Hashimoto's, dx'd 1989  . Diverticulosis of colon     Procto '97 and colonoscopy 2002  . DDD (degenerative disc disease), cervical     1998 MRI C5-6 impingemt (left)  . DDD (degenerative disc disease), lumbar   . Spinal stenosis of lumbar region with neurogenic claudication     2008 MRI (L3-4, L4-5, L5-S1)  . Fibrocystic breast disease     w/fibroadenoma.  Bx's showed NO atypia (Dr. Margot Chimes).  Mammo neg 2008.  Marland Kitchen Chest pain, non-cardiac     Cardiac CT showed no signif obstructive dz, mildly elevated calcium level (Dr. [name]).  . History of double vision     Ophthalmologist, Dr. Herbert Deaner, is further evaluating this with MRI  orbits and limited brain (myesthenia gravis testing neg  07/2010)  . Depression with anxiety 04/15/2011  . Asthma   . Dysplastic nevus of upper extremity 04/2014    R tricep ([name])  . Osteoarthritis, hip, bilateral     End stage; plan for R THA w/[place] [facility] as of 09/22/14  . History of home oxygen therapy     uses 2 liters at hs  . Family history of adverse reaction to anesthesia     mother died when patient age 83 receiving anesthesia for thyroid surgery   Past Surgical History  Procedure  Laterality Date  . Cholecystectomy  2001  . Tubal ligation  1974  . Abdominal hysterectomy  1978    no BSO per pt--nonmalignant reasons  . Breast cyst excision      Last screening mammogram 10/2010 was normal.  . Appendectomy    . Tonsillectomy    . Colonoscopy  04/2005    NORMAL-recall 10 yrs  . Transthoracic echocardiogram  08/2014    EF 60-65%, grade I diast dysfxn  . Total hip arthroplasty Right 11/01/2014    Procedure: RIGHT TOTAL HIP ARTHROPLASTY;  Surgeon: Latanya Maudlin, MD;  Location: [facility];  Service: Orthopedics;  Laterality: Right;   Social History:   reports that she quit smoking about 24 years ago. Her smoking use included Cigarettes. She has a 70 pack-year smoking history. She has never used smokeless tobacco. She reports that she drinks alcohol. She reports that she does not use illicit drugs.  Family History  Problem Relation Age of Onset  . Goiter Mother   . Psoriasis Father     od liver  . Diabetes Daughter     Type 2  . Cancer Paternal Aunt     stomach?  . Stroke Maternal Grandfather   . Stroke Paternal Grandmother   . Hypertension Paternal Grandmother   . Hernia Paternal Grandmother   . Cancer Paternal Grandfather     lung  . Cirrhosis Father     Medications:   Medication List       This list is accurate as of: 11/07/14  4:44 PM.  Always use your most recent med list.               albuterol 108 (90 BASE) MCG/ACT inhaler  Commonly known as:  PROAIR HFA  Inhale 2 puffs into the lungs every 4 (four) hours as needed.     amLODipine 5 MG tablet  Commonly known as:  NORVASC  Take 1 tablet (5 mg total) by mouth daily.     cetirizine 10 MG tablet  Commonly known as:  ZYRTEC  Take 1 tablet (10 mg total) by mouth daily.     conjugated estrogens vaginal cream  Commonly known as:  PREMARIN  Place vaginally daily.     diazepam 5 MG tablet  Commonly known as:  VALIUM  Take 1 tablet (5 mg total) by mouth every 12 (twelve) hours as needed.      ferrous sulfate 325 (65 FE) MG tablet  Take 1 tablet (325 mg total) by mouth 2 (two) times daily with a meal.     Fluticasone-Salmeterol 250-50 MCG/DOSE Aepb  Commonly known as:  ADVAIR DISKUS  Inhale 1 puff into the lungs 2 (two) times daily.     HYDROcodone-acetaminophen 5-325 MG tablet  Commonly known as:  NORCO/VICODIN  Take 1-2 tablets by mouth every 4 (four) hours as needed (breakthrough pain).     ipratropium 0.03 % nasal spray  Commonly known as:  ATROVENT  2  sprays into each nostril q12h prn     methocarbamol 500 MG tablet  Commonly known as:  ROBAXIN  Take 1 tablet (500 mg total) by mouth every 6 (six) hours as needed for muscle spasms.     omeprazole 20 MG capsule  Commonly known as:  PRILOSEC  Take 20 mg by mouth daily.     RESTASIS 0.05 % ophthalmic emulsion  Generic drug:  cycloSPORINE  Apply 1 drop to eye 2 (two) times daily.     rivaroxaban 10 MG Tabs tablet  Commonly known as:  XARELTO  Take 1 tablet (10 mg total) by mouth daily with breakfast.     roflumilast 500 MCG Tabs tablet  Commonly known as:  DALIRESP  Take 1 tablet by mouth  daily     SPIRIVA HANDIHALER 18 MCG inhalation capsule  Generic drug:  tiotropium  INHALE 1 CAPSULE VIA HANDIHALER EVERY DAY AT THE SAME TIME     SYNTHROID 88 MCG tablet  Generic drug:  levothyroxine  TAKE 1 TABLET BY MOUTH DAILY     venlafaxine 50 MG tablet  Commonly known as:  EFFEXOR  Take 1 tablet (50 mg total) by mouth 2 (two) times daily.         Physical Exam: Filed Vitals:   11/07/14 1634  BP: 105/50  Pulse: 83  Temp: 98.7 F (37.1 C)  Resp: 18  SpO2: 95%    General- elderly female, well built, in no acute distress Head- normocephalic, atraumatic Nose- normal nasal mucosa, no maxillary or frontal sinus tenderness, no nasal discharge Throat- moist mucus membrane Eyes- PERRLA, EOMI, no pallor, no icterus, no discharge, normal conjunctiva, normal sclera Neck- no cervical  lymphadenopathy Cardiovascular- normal s1,s2, no murmurs, palpable dorsalis pedis and radial pulses, 1+ right leg edema Respiratory- bilateral clear to auscultation, no wheeze, no rhonchi, no crackles, no use of accessory muscles Abdomen- bowel sounds present, soft, non tender Musculoskeletal- able to move all 4 extremities, limited ROM with right hip Neurological- no focal deficit, alert and oriented to person, place and time Skin- warm and dry, right hip surgical incision with aquacel dressing Psychiatry- normal mood and affect    Labs reviewed: Basic Metabolic Panel:  Recent Labs  10/26/14 1500 11/02/14 0515 11/03/14 0525  NA 140 139 139  K 4.1 4.1 3.6  CL 105 107 105  CO2 26 27 28   GLUCOSE 90 123* 107*  BUN 14 11 11   CREATININE 0.73 0.60 0.65  CALCIUM 9.0 8.7* 8.6*   Liver Function Tests:  Recent Labs  06/02/14 1518 08/16/14 1501 10/26/14 1500  AST 12 15 19   ALT 11 11 14   ALKPHOS 101 104 98  BILITOT 0.4 0.5 0.4  PROT 6.8 7.0 7.4  ALBUMIN 3.7 4.2 4.1   No results for input(s): LIPASE, AMYLASE in the last 8760 hours. No results for input(s): AMMONIA in the last 8760 hours. CBC:  Recent Labs  06/02/14 1518 10/26/14 1500 11/02/14 0515 11/03/14 0525 11/04/14 0455  WBC 9.5 10.7* 13.1* 11.8* 10.6*  NEUTROABS 6.6 7.7  --   --   --   HGB 12.1 12.6 9.4* 9.0* 8.8*  HCT 36.2 39.7 30.4* 28.1* 27.5*  MCV 80.1 81.9 82.2 83.1 81.6  PLT 471* 350 234 217 212   11/06/14 na 144, k 4, bun 12, cr 0.65, glu 86, tsh 2.29, hb 8.9, hct 29.3, wbc 7.1, plt 305  Radiological Exams: Dg Chest 2 View  10/26/2014   CLINICAL DATA:  Asthma. COPD. Preoperative for right  total hip replacement.  EXAM: CHEST  2 VIEW  COMPARISON:  06/18/2010  FINDINGS: Emphysema. Mildly tortuous thoracic aorta with mild atherosclerotic calcification of the arch. Mild enlargement of the cardiopericardial silhouette. No edema.  IMPRESSION: 1. Severe emphysema. 2. Mild enlargement of the cardiopericardial  silhouette, without edema.   Electronically Signed   By: Van Clines M.D.   On: 10/26/2014 15:36    Assessment/Plan  Physical deconditioning Post right hip replacement. Will have patient work with PT/OT as tolerated to regain strength and restore function.  Fall precautions are in place.  Right hip OA S/p right total hip arthroplasty. Will have her work with physical therapy and occupational therapy team to help with gait training and muscle strengthening exercises.fall precautions. Skin care. Encourage to be out of bed. Continue norco 5-325 mg 1-2 tab q4h prn pain and robaxin 500 mg q6h prn muscle spasm. Continue xarelto for dvt prophylaxis. To f/u with orthopedics.   Blood loss anemia Post op, hb stable but low. Continue ferrous sulfate 325 mg bid and check cbc in 1 week  Constipation Limited mobility post surgery, being on pain medication and iron supplement could all be contributing to this. Add miralax daily for 3 days, then prn and add senna s 2 tab qhs and reassess. Hydration encouraged  Right leg edema Add ted hose to help with the swelling  Copd On o2, wean off to keep o2 sat > 885 with exertion and >90% with rest for now. Continue home regimen daliresp, atorvent/albuterol, advir and spiriva  HTN Stable bp with some low DBP reading. Currently on norvasc 5 mg daily, monitor bp daily and reassess if DBP remains below 60  gerd Continue prilosec 20 mg daily, symptom controlled  Hypothyroidism Continue synthroid 88 mcg daily, reviewed tsh stable   Goals of care: short term rehabilitation   Labs/tests ordered: cbc in 1 week  Family/ staff Communication: reviewed care plan with patient and nursing supervisor    Blanchie Serve, MD  [place] (Monday-Friday 8 am - 5 pm) [phone] (afterhours)

## 2014-11-08 ENCOUNTER — Non-Acute Institutional Stay (SKILLED_NURSING_FACILITY): Payer: Medicare Other | Admitting: Nurse Practitioner

## 2014-11-08 DIAGNOSIS — R5381 Other malaise: Secondary | ICD-10-CM | POA: Diagnosis not present

## 2014-11-08 DIAGNOSIS — I10 Essential (primary) hypertension: Secondary | ICD-10-CM

## 2014-11-08 DIAGNOSIS — D62 Acute posthemorrhagic anemia: Secondary | ICD-10-CM

## 2014-11-08 DIAGNOSIS — E039 Hypothyroidism, unspecified: Secondary | ICD-10-CM

## 2014-11-08 DIAGNOSIS — J449 Chronic obstructive pulmonary disease, unspecified: Secondary | ICD-10-CM | POA: Diagnosis not present

## 2014-11-08 DIAGNOSIS — R600 Localized edema: Secondary | ICD-10-CM | POA: Diagnosis not present

## 2014-11-08 DIAGNOSIS — K5901 Slow transit constipation: Secondary | ICD-10-CM

## 2014-11-08 DIAGNOSIS — M1611 Unilateral primary osteoarthritis, right hip: Secondary | ICD-10-CM | POA: Diagnosis not present

## 2014-11-08 NOTE — Progress Notes (Signed)
Patient ID: Priscilla Cantrell, female   DOB: October 21, 1935, 79 y.o.   MRN: [id number]    Nursing Home Location:  [place] of Service: SNF (31)  PCP: [name], MD  Allergies  Allergen Reactions  . Losartan Other (See Comments)    hyperkalemia  . Penicillins Hives, Swelling and Rash    Has patient had a PCN reaction causing immediate rash, facial/tongue/throat swelling, SOB or lightheadedness with hypotension: YES Has patient had a PCN reaction causing severe rash involving mucus membranes or skin necrosis: NO Has patient had a PCN reaction that required hospitalization NO Has patient had a PCN reaction occurring within the last 10 years: NO If all of the above answers are "NO", then may proceed with Cephalosporin use.   . Cefixime [Kdc:Cefixime] Other (See Comments)    unspecified  . Indocin [Indomethacin] Other (See Comments)    Painful tongue and throat  . Sulfa Antibiotics Other (See Comments)    unspecified  . Ace Inhibitors Other (See Comments)    cough  . Statins Other (See Comments)    Myalgias     Chief Complaint  Patient presents with  . Discharge Note    HPI:  Patient is a 79 y.o. female seen today at [facility] for discharge home. Pt is at [facility] for short term rehabilitation post hospital admission from 11/01/14-11/04/14 with right hip OA. She underwent right total hip arthroplasty. Her pain is under control with current regimen. Denies muscle spasm. Has been constipated and had bowel movement today. Pt planning on discharging home alone and reports ongoing generalized weakness. Patient stable and currently doing well with therapy however will need  to discharge home with home health for ongoing treatment.  Review of Systems:  Review of Systems  Constitutional: Negative for activity change, appetite change, fatigue and unexpected weight change.  HENT: Negative for congestion and hearing loss.   Eyes:  Negative.   Respiratory: Positive for shortness of breath (with exertion, improves with rest). Negative for cough.   Cardiovascular: Negative for chest pain, palpitations and leg swelling.  Gastrointestinal: Positive for constipation (had BM today, regimen increased yesterday). Negative for abdominal pain and diarrhea.  Genitourinary: Negative for dysuria and difficulty urinating.  Musculoskeletal: Negative for myalgias and arthralgias.       Pain controlled on current regimen   Skin: Negative for color change and wound.  Neurological: Negative for dizziness and weakness.  Psychiatric/Behavioral: Negative for behavioral problems, confusion and agitation.    Past Medical History  Diagnosis Date  . Hypertension     EKG 03/2010 normal  . COPD (chronic obstructive pulmonary disease)     spirometry 01/10/09 FEV 0.97(52%), FEV1% 47  . Hypoxemia     O2 86% RA with exertion 01/10/09, 2 liters oxygen with exertion and sleep  . Pulmonary nodule     CT chest 12/10, June 2011.  No nodule on CT 06/2010.  Marland Kitchen Spinal stenosis     Symptomatic (neurogenic claudication)  . GERD (gastroesophageal reflux disease)   . Hypothyroidism     Hashimoto's, dx'd 1989  . Diverticulosis of colon     Procto '97 and colonoscopy 2002  . DDD (degenerative disc disease), cervical     1998 MRI C5-6 impingemt (left)  . DDD (degenerative disc disease), lumbar   . Spinal stenosis of lumbar region with neurogenic claudication     2008 MRI (L3-4, L4-5, L5-S1)  . Fibrocystic breast disease  w/fibroadenoma.  Bx's showed NO atypia (Dr. Margot Chimes).  Mammo neg 2008.  Marland Kitchen Chest pain, non-cardiac     Cardiac CT showed no signif obstructive dz, mildly elevated calcium level (Dr. [name]).  . History of double vision     Ophthalmologist, Dr. Herbert Deaner, is further evaluating this with MRI  orbits and limited brain (myesthenia gravis testing neg 07/2010)  . Depression with anxiety 04/15/2011  . Asthma   . Dysplastic nevus of upper  extremity 04/2014    R tricep ([name])  . Osteoarthritis, hip, bilateral     End stage; plan for R THA w/[place] ortho as of 09/22/14  . History of home oxygen therapy     uses 2 liters at hs  . Family history of adverse reaction to anesthesia     mother died when patient age 25 receiving anesthesia for thyroid surgery   Past Surgical History  Procedure Laterality Date  . Cholecystectomy  2001  . Tubal ligation  1974  . Abdominal hysterectomy  1978    no BSO per pt--nonmalignant reasons  . Breast cyst excision      Last screening mammogram 10/2010 was normal.  . Appendectomy    . Tonsillectomy    . Colonoscopy  04/2005    NORMAL-recall 10 yrs  . Transthoracic echocardiogram  08/2014    EF 60-65%, grade I diast dysfxn  . Total hip arthroplasty Right 11/01/2014    Procedure: RIGHT TOTAL HIP ARTHROPLASTY;  Surgeon: Latanya Maudlin, MD;  Location: [facility];  Service: Orthopedics;  Laterality: Right;   Social History:   reports that she quit smoking about 24 years ago. Her smoking use included Cigarettes. She has a 70 pack-year smoking history. She has never used smokeless tobacco. She reports that she drinks alcohol. She reports that she does not use illicit drugs.  Family History  Problem Relation Age of Onset  . Goiter Mother   . Psoriasis Father     od liver  . Diabetes Daughter     Type 2  . Cancer Paternal Aunt     stomach?  . Stroke Maternal Grandfather   . Stroke Paternal Grandmother   . Hypertension Paternal Grandmother   . Hernia Paternal Grandmother   . Cancer Paternal Grandfather     lung  . Cirrhosis Father     Medications: [name] Medications  New Prescriptions   No medications on file  Previous Medications   ALBUTEROL (PROAIR HFA) 108 (90 BASE) MCG/ACT INHALER    Inhale 2 puffs into the lungs every 4 (four) hours as needed.   AMLODIPINE (NORVASC) 5 MG TABLET    Take 1 tablet (5 mg total) by mouth daily.   CETIRIZINE (ZYRTEC) 10 MG TABLET    Take 1 tablet (10 mg  total) by mouth daily.   CONJUGATED ESTROGENS (PREMARIN) VAGINAL CREAM    Place vaginally daily.   DIAZEPAM (VALIUM) 5 MG TABLET    Take 1 tablet (5 mg total) by mouth every 12 (twelve) hours as needed.   FERROUS SULFATE 325 (65 FE) MG TABLET    Take 1 tablet (325 mg total) by mouth 2 (two) times daily with a meal.   FLUTICASONE-SALMETEROL (ADVAIR DISKUS) 250-50 MCG/DOSE AEPB    Inhale 1 puff into the lungs 2 (two) times daily.   HYDROCODONE-ACETAMINOPHEN (NORCO/VICODIN) 5-325 MG TABLET    Take 1-2 tablets by mouth every 4 (four) hours as needed (breakthrough pain).   IPRATROPIUM (ATROVENT) 0.03 % NASAL SPRAY    2 sprays into  each nostril q12h prn   METHOCARBAMOL (ROBAXIN) 500 MG TABLET    Take 1 tablet (500 mg total) by mouth every 6 (six) hours as needed for muscle spasms.   OMEPRAZOLE (PRILOSEC) 20 MG CAPSULE    Take 20 mg by mouth daily.     POLYETHYLENE GLYCOL (MIRALAX / GLYCOLAX) PACKET    Take 17 g by mouth daily as needed.   RESTASIS 0.05 % OPHTHALMIC EMULSION    Apply 1 drop to eye 2 (two) times daily.   RIVAROXABAN (XARELTO) 10 MG TABS TABLET    Take 1 tablet (10 mg total) by mouth daily with breakfast.   ROFLUMILAST (DALIRESP) 500 MCG TABS TABLET    Take 1 tablet by mouth  daily   SENNOSIDES-DOCUSATE SODIUM (SENOKOT-S) 8.6-50 MG TABLET    Take 2 tablets by mouth daily.   SPIRIVA HANDIHALER 18 MCG INHALATION CAPSULE    INHALE 1 CAPSULE VIA HANDIHALER EVERY DAY AT THE SAME TIME   SYNTHROID 88 MCG TABLET    TAKE 1 TABLET BY MOUTH DAILY   VENLAFAXINE (EFFEXOR) 50 MG TABLET    Take 1 tablet (50 mg total) by mouth 2 (two) times daily.  Modified Medications   No medications on file  Discontinued Medications   No medications on file     Physical Exam: Filed Vitals:   11/08/14 1246  BP: 124/70  Pulse: 74  Temp: 98.1 F (36.7 C)  Resp: 20  SpO2: 98%    Physical Exam  Constitutional: She is oriented to person, place, and time. She appears well-developed and well-nourished. No  distress.  Generalized weakness  HENT:  Head: Normocephalic and atraumatic.  Mouth/Throat: Oropharynx is clear and moist. No oropharyngeal exudate.  Eyes: Conjunctivae are normal. Pupils are equal, round, and reactive to light.  Neck: Normal range of motion. Neck supple.  Cardiovascular: Normal rate, regular rhythm and normal heart sounds.   Pulmonary/Chest: Effort normal and breath sounds normal.  Abdominal: Soft. Bowel sounds are normal.  Musculoskeletal: She exhibits no edema or tenderness.  Neurological: She is alert and oriented to person, place, and time.  Skin: Skin is warm and dry. She is not diaphoretic.  Aquacel dressing to right hip  Psychiatric: She has a normal mood and affect.    Labs reviewed: Basic Metabolic Panel:  Recent Labs  10/26/14 1500 11/02/14 0515 11/03/14 0525  NA 140 139 139  K 4.1 4.1 3.6  CL 105 107 105  CO2 26 27 28   GLUCOSE 90 123* 107*  BUN 14 11 11   CREATININE 0.73 0.60 0.65  CALCIUM 9.0 8.7* 8.6*   Liver Function Tests:  Recent Labs  06/02/14 1518 08/16/14 1501 10/26/14 1500  AST 12 15 19   ALT 11 11 14   ALKPHOS 101 104 98  BILITOT 0.4 0.5 0.4  PROT 6.8 7.0 7.4  ALBUMIN 3.7 4.2 4.1   No results for input(s): LIPASE, AMYLASE in the last 8760 hours. No results for input(s): AMMONIA in the last 8760 hours. CBC:  Recent Labs  06/02/14 1518 10/26/14 1500 11/02/14 0515 11/03/14 0525 11/04/14 0455  WBC 9.5 10.7* 13.1* 11.8* 10.6*  NEUTROABS 6.6 7.7  --   --   --   HGB 12.1 12.6 9.4* 9.0* 8.8*  HCT 36.2 39.7 30.4* 28.1* 27.5*  MCV 80.1 81.9 82.2 83.1 81.6  PLT 471* 350 234 217 212   TSH:  Recent Labs  05/08/14 1512  TSH 2.86   A1C: Lab Results  Component Value Date   HGBA1C  5.7 10/29/2011   Lipid Panel: No results for input(s): CHOL, HDL, LDLCALC, TRIG, CHOLHDL, LDLDIRECT in the last 8760 hours.  Radiological Exams: Dg Chest 2 View  10/26/2014   CLINICAL DATA:  Asthma. COPD. Preoperative for right total hip  replacement.  EXAM: CHEST  2 VIEW  COMPARISON:  06/18/2010  FINDINGS: Emphysema. Mildly tortuous thoracic aorta with mild atherosclerotic calcification of the arch. Mild enlargement of the cardiopericardial silhouette. No edema.  IMPRESSION: 1. Severe emphysema. 2. Mild enlargement of the cardiopericardial silhouette, without edema.   Electronically Signed   By: Van Clines M.D.   On: 10/26/2014 15:36    Assessment/Plan 1. Primary osteoarthritis of right hip S/p right total hip arthroplasty.  Continue norco 5-325 mg 1-2 tab q4h prn pain and robaxin 500 mg q6h prn muscle spasm. Continue xarelto for dvt prophylaxis. Has follow up with orthopedics.   2. Slow transit constipation Has had BM today after several days without, cont on miralax and senna s daily  3. COPD with chronic bronchitis ([place]) On chronic O2, conts 2L to keep sats over 90%, Continue home regimen daliresp, atorvent/albuterol, advir and spiriva.  4. Essential hypertension Stable, cont norvasc 5 mg daily   5. Acute blood loss anemia Post-op, will cont iron 325 mg BID and follow up CBC with PCP  6. Hypothyroidism, unspecified hypothyroidism type -to cont synthroid 88 mcg daily   7. Leg edema, right improved with compression hose. To cont compression hose daily   8. Physical deconditioning Post right hip replacement being discharge from facility. For safe discharge will need PT/OT per home health regain strength and restore function. To cont fall precautions.   pt is stable for discharge-will need PT/OT/HHA per home health. No DME needed. Rx written.  will need to follow up with PCP within 2 weeks.    Carlos American. Harle Battiest  [facility] [phone] 8 am - 5 pm) [phone] (after hours)

## 2014-11-11 DIAGNOSIS — M6281 Muscle weakness (generalized): Secondary | ICD-10-CM | POA: Diagnosis not present

## 2014-11-11 DIAGNOSIS — M4806 Spinal stenosis, lumbar region: Secondary | ICD-10-CM | POA: Diagnosis not present

## 2014-11-11 DIAGNOSIS — M1612 Unilateral primary osteoarthritis, left hip: Secondary | ICD-10-CM | POA: Diagnosis not present

## 2014-11-11 DIAGNOSIS — M5030 Other cervical disc degeneration, unspecified cervical region: Secondary | ICD-10-CM | POA: Diagnosis not present

## 2014-11-11 DIAGNOSIS — M5136 Other intervertebral disc degeneration, lumbar region: Secondary | ICD-10-CM | POA: Diagnosis not present

## 2014-11-11 DIAGNOSIS — Z471 Aftercare following joint replacement surgery: Secondary | ICD-10-CM | POA: Diagnosis not present

## 2014-11-13 DIAGNOSIS — M6281 Muscle weakness (generalized): Secondary | ICD-10-CM | POA: Diagnosis not present

## 2014-11-13 DIAGNOSIS — M5030 Other cervical disc degeneration, unspecified cervical region: Secondary | ICD-10-CM | POA: Diagnosis not present

## 2014-11-13 DIAGNOSIS — M1612 Unilateral primary osteoarthritis, left hip: Secondary | ICD-10-CM | POA: Diagnosis not present

## 2014-11-13 DIAGNOSIS — M5136 Other intervertebral disc degeneration, lumbar region: Secondary | ICD-10-CM | POA: Diagnosis not present

## 2014-11-13 DIAGNOSIS — Z471 Aftercare following joint replacement surgery: Secondary | ICD-10-CM | POA: Diagnosis not present

## 2014-11-13 DIAGNOSIS — M4806 Spinal stenosis, lumbar region: Secondary | ICD-10-CM | POA: Diagnosis not present

## 2014-11-14 DIAGNOSIS — Z96641 Presence of right artificial hip joint: Secondary | ICD-10-CM | POA: Diagnosis not present

## 2014-11-14 DIAGNOSIS — Z471 Aftercare following joint replacement surgery: Secondary | ICD-10-CM | POA: Diagnosis not present

## 2014-11-15 ENCOUNTER — Ambulatory Visit (INDEPENDENT_AMBULATORY_CARE_PROVIDER_SITE_OTHER): Payer: Medicare Other | Admitting: Family Medicine

## 2014-11-15 ENCOUNTER — Encounter: Payer: Self-pay | Admitting: Family Medicine

## 2014-11-15 VITALS — BP 156/78 | HR 69 | Temp 98.3°F | Resp 16 | Ht 60.0 in | Wt 144.0 lb

## 2014-11-15 DIAGNOSIS — J449 Chronic obstructive pulmonary disease, unspecified: Secondary | ICD-10-CM

## 2014-11-15 DIAGNOSIS — R0902 Hypoxemia: Secondary | ICD-10-CM | POA: Diagnosis not present

## 2014-11-15 DIAGNOSIS — R0989 Other specified symptoms and signs involving the circulatory and respiratory systems: Secondary | ICD-10-CM | POA: Diagnosis not present

## 2014-11-15 DIAGNOSIS — I10 Essential (primary) hypertension: Secondary | ICD-10-CM

## 2014-11-15 DIAGNOSIS — D62 Acute posthemorrhagic anemia: Secondary | ICD-10-CM

## 2014-11-15 MED ORDER — AZITHROMYCIN 250 MG PO TABS: ORAL_TABLET | ORAL | Status: DC

## 2014-11-15 NOTE — Progress Notes (Signed)
OFFICE VISIT  11/15/2014   CC:  Chief Complaint  Patient presents with  . Follow-up    Breathing, hip replacement, and medications   HPI:    Patient is a 79 y.o. Caucasian female who presents for follow up. She got right total hip arthroplasty on 11/01/14--says surgery went ok as far as she can tell, but beyond that she describes it as "a fiasco." She feels she was d/c'd from hosp hastily and w/out any prep to go anywhere, was then sent to rehab facility in [place] and after 4-5 days she was going to be sent home, originally w/out any PT but sounds like home PT got workeout out but is less than ideal at this time. She feels like the home therapy set up is inadequate/not lasting the full hour and is upset. Yesterday she followed up at ortho and she was told to take TWO 325mg  ASA qd x 2 wks and ONE qd until he tells her to quit.  She had originally been d/c'd home on xarelto but insurer would not cover this and ortho was not contacted.  She is a bit confused about meds---exactly what she should be taking-- and her low oxygen.  Apparently her oxygen is dropping into 70s when she ambulates, has more SOB with ambulation than usual.  Has no cough.  No CP.  At rest she feels no SOB or chest tightness or wheezing.  Denies fevers. BP was fluctuating: up and down in hosp, sounds like daughter was worried about this as well.  Drinking fairly well but intake of solids not that good.   Past Medical History  Diagnosis Date  . Hypertension     EKG 03/2010 normal  . COPD (chronic obstructive pulmonary disease) ([place])     spirometry 01/10/09 FEV 0.97(52%), FEV1% 47  . Hypoxemia     O2 86% RA with exertion 01/10/09, 2 liters oxygen with exertion and sleep  . Pulmonary nodule     CT chest 12/10, June 2011.  No nodule on CT 06/2010.  Marland Kitchen Spinal stenosis     Symptomatic (neurogenic claudication)  . GERD (gastroesophageal reflux disease)   . Hypothyroidism     Hashimoto's, dx'd 1989  . Diverticulosis  of colon     Procto '97 and colonoscopy 2002  . DDD (degenerative disc disease), cervical     1998 MRI C5-6 impingemt (left)  . DDD (degenerative disc disease), lumbar   . Spinal stenosis of lumbar region with neurogenic claudication     2008 MRI (L3-4, L4-5, L5-S1)  . Fibrocystic breast disease     w/fibroadenoma.  Bx's showed NO atypia (Dr. Margot Chimes).  Mammo neg 2008.  Marland Kitchen Chest pain, non-cardiac     Cardiac CT showed no signif obstructive dz, mildly elevated calcium level (Dr. [name]).  . History of double vision     Ophthalmologist, Dr. Herbert Deaner, is further evaluating this with MRI  orbits and limited brain (myesthenia gravis testing neg 07/2010)  . Depression with anxiety 04/15/2011  . Asthma   . Dysplastic nevus of upper extremity 04/2014    R tricep ([name])  . Osteoarthritis, hip, bilateral     End stage; R THA  . History of home oxygen therapy     uses 2 liters at hs  . Family history of adverse reaction to anesthesia     mother died when patient age 58 receiving anesthesia for thyroid surgery    Past Surgical History  Procedure Laterality Date  . Cholecystectomy  2001  .  Tubal ligation  1974  . Abdominal hysterectomy  1978    no BSO per pt--nonmalignant reasons  . Breast cyst excision      Last screening mammogram 10/2010 was normal.  . Appendectomy    . Tonsillectomy    . Colonoscopy  04/2005    NORMAL-recall 10 yrs  . Transthoracic echocardiogram  08/2014    EF 60-65%, grade I diast dysfxn  . Total hip arthroplasty Right 11/01/2014    Procedure: RIGHT TOTAL HIP ARTHROPLASTY;  Surgeon: Latanya Maudlin, MD;  Location: [facility];  Service: Orthopedics;  Laterality: Right;    Outpatient Prescriptions Prior to Visit  Medication Sig Dispense Refill  . albuterol (PROAIR HFA) 108 (90 BASE) MCG/ACT inhaler Inhale 2 puffs into the lungs every 4 (four) hours as needed. (Patient taking differently: Inhale 2 puffs into the lungs every 4 (four) hours as needed for wheezing or shortness  of breath. ) 1 Inhaler 5  . amLODipine (NORVASC) 5 MG tablet Take 1 tablet (5 mg total) by mouth daily. 90 tablet 3  . cetirizine (ZYRTEC) 10 MG tablet Take 1 tablet (10 mg total) by mouth daily. 30 tablet 11  . diazepam (VALIUM) 5 MG tablet Take 1 tablet (5 mg total) by mouth every 12 (twelve) hours as needed. (Patient taking differently: Take 5 mg by mouth every 12 (twelve) hours as needed for anxiety. ) 45 tablet 0  . Fluticasone-Salmeterol (ADVAIR DISKUS) 250-50 MCG/DOSE AEPB Inhale 1 puff into the lungs 2 (two) times daily. 1 each 6  . HYDROcodone-acetaminophen (NORCO/VICODIN) 5-325 MG tablet Take 1-2 tablets by mouth every 4 (four) hours as needed (breakthrough pain). 80 tablet 0  . methocarbamol (ROBAXIN) 500 MG tablet Take 1 tablet (500 mg total) by mouth every 6 (six) hours as needed for muscle spasms. 40 tablet 1  . omeprazole (PRILOSEC) 20 MG capsule Take 20 mg by mouth daily.      . RESTASIS 0.05 % ophthalmic emulsion Apply 1 drop to eye 2 (two) times daily.  4  . roflumilast (DALIRESP) 500 MCG TABS tablet Take 1 tablet by mouth  daily 90 tablet 1  . SPIRIVA HANDIHALER 18 MCG inhalation capsule INHALE 1 CAPSULE VIA HANDIHALER EVERY DAY AT THE SAME TIME 30 capsule 6  . SYNTHROID 88 MCG tablet TAKE 1 TABLET BY MOUTH DAILY 30 tablet 6  . venlafaxine (EFFEXOR) 50 MG tablet Take 1 tablet (50 mg total) by mouth 2 (two) times daily. (Patient taking differently: Take 50 mg by mouth 2 (two) times daily. Takes 1 tab am, 1/2 tab pm) 60 tablet 3  . conjugated estrogens (PREMARIN) vaginal cream Place vaginally daily. (Patient not taking: Reported on 11/15/2014) 42.5 g 12  . ferrous sulfate 325 (65 FE) MG tablet Take 1 tablet (325 mg total) by mouth 2 (two) times daily with a meal. (Patient not taking: Reported on 11/15/2014) 30 tablet 0  . ipratropium (ATROVENT) 0.03 % nasal spray 2 sprays into each nostril q12h prn (Patient not taking: Reported on 11/15/2014) 30 mL 1  . polyethylene glycol (MIRALAX /  GLYCOLAX) packet Take 17 g by mouth daily as needed.    . rivaroxaban (XARELTO) 10 MG TABS tablet Take 1 tablet (10 mg total) by mouth daily with breakfast. (Patient not taking: Reported on 11/15/2014) 19 tablet 0  . sennosides-docusate sodium (SENOKOT-S) 8.6-50 MG tablet Take 2 tablets by mouth daily.     No facility-administered medications prior to visit.    Allergies  Allergen Reactions  . Losartan Other (  See Comments)    hyperkalemia  . Penicillins Hives, Swelling and Rash    Has patient had a PCN reaction causing immediate rash, facial/tongue/throat swelling, SOB or lightheadedness with hypotension: YES Has patient had a PCN reaction causing severe rash involving mucus membranes or skin necrosis: NO Has patient had a PCN reaction that required hospitalization NO Has patient had a PCN reaction occurring within the last 10 years: NO If all of the above answers are "NO", then may proceed with Cephalosporin use.   . Cefixime [Kdc:Cefixime] Other (See Comments)    unspecified  . Indocin [Indomethacin] Other (See Comments)    Painful tongue and throat  . Sulfa Antibiotics Other (See Comments)    unspecified  . Ace Inhibitors Other (See Comments)    cough  . Statins Other (See Comments)    Myalgias     ROS As per HPI  PE: Blood pressure 156/78, pulse 69, temperature 98.3 F (36.8 C), temperature source Oral, resp. rate 16, height 5' (1.524 m), weight 144 lb (65.318 kg), SpO2 98 %. pulse ox 97% on RA. Gen: alert, tired but otherwise well appearing, NAD.  Oriented x 4. JAS:NKNL: no injection, icteris, swelling, or exudate.  EOMI, PERRLA. Mouth: lips without lesion/swelling.  Oral mucosa pink and moist. Oropharynx without erythema, exudate, or swelling.  CV: RRR, no m/r/g LUNGS: bibasilar insp crackles, L>R.  Some of this is less audible after a deep breath and a couple of coughs. Aeration is good and symmetric.  Breathing nonlabored. No tachypnea. EXT: no cyanosis.  1+  pitting edema in both LL's. No calf, popliteal, or thigh tenderness.  LABS:  Lab Results  Component Value Date   WBC 10.6* 11/04/2014   HGB 8.8* 11/04/2014   HCT 27.5* 11/04/2014   MCV 81.6 11/04/2014   PLT 212 11/04/2014     Chemistry      Component Value Date/Time   NA 139 11/03/2014 0525   K 3.6 11/03/2014 0525   CL 105 11/03/2014 0525   CO2 28 11/03/2014 0525   BUN 11 11/03/2014 0525   CREATININE 0.65 11/03/2014 0525   CREATININE 0.68 06/02/2014 1518      Component Value Date/Time   CALCIUM 8.6* 11/03/2014 0525   ALKPHOS 98 10/26/2014 1500   AST 19 10/26/2014 1500   ALT 14 10/26/2014 1500   BILITOT 0.4 10/26/2014 1500      IMPRESSION AND PLAN:  1) COPD, severe.  Has some hypoxia with ambulation, quickly corrects with 2L oxygen. No hypoxia on RA at rest.   Abnormal lung sounds on auscultation: infiltrate vs atelectasis. Start azithromycin and check CXR ASAP.  2) Post-op R THA: anticoag (325mg  ASA x 2 qd x 2 wks, then 1 ASA 325mg  qd) per ortho, PT/rehab per ortho (Dr. Gladstone Lighter w/[facility] [facility]). Reviewed med list with pt to clarify meds today.  Pain relatively well controlled on vicodin 5/325.  3) Post-op anemia: needs to start FeSO4 325mg  bid as instructed upon d/c from hospital. She will start this now and we'll recheck Hb at f/u in office in 3 weeks.  4) HTN: bp a little up today but she is in pain.  Will hold off on any change at this time since she may still be a bit hypovolemic plus she had some probs with low bp's in hosp after surgery per pt's daughter's report today.  Spent 50 min with pt today, with >50% of this time spent in counseling and care coordination regarding the above problems.  FOLLOW UP:  Return in about 3 weeks (around 12/06/2014) for routine chronic illness f/u.

## 2014-11-15 NOTE — Patient Instructions (Signed)
Buy OTC generic ferrous sulfate 325, 1 tab twice daily.

## 2014-11-15 NOTE — Progress Notes (Signed)
Pre visit review using our clinic review tool, if applicable. No additional management support is needed unless otherwise documented below in the visit note. 

## 2014-11-16 ENCOUNTER — Ambulatory Visit (HOSPITAL_BASED_OUTPATIENT_CLINIC_OR_DEPARTMENT_OTHER)
Admission: RE | Admit: 2014-11-16 | Discharge: 2014-11-16 | Disposition: A | Discharge status: Home / Self Care | Payer: Medicare Other | Source: Ambulatory Visit | Attending: Family Medicine | Admitting: Family Medicine

## 2014-11-16 DIAGNOSIS — M5030 Other cervical disc degeneration, unspecified cervical region: Secondary | ICD-10-CM | POA: Diagnosis not present

## 2014-11-16 DIAGNOSIS — Z471 Aftercare following joint replacement surgery: Secondary | ICD-10-CM | POA: Diagnosis not present

## 2014-11-16 DIAGNOSIS — R0902 Hypoxemia: Secondary | ICD-10-CM

## 2014-11-16 DIAGNOSIS — M4806 Spinal stenosis, lumbar region: Secondary | ICD-10-CM | POA: Diagnosis not present

## 2014-11-16 DIAGNOSIS — R918 Other nonspecific abnormal finding of lung field: Secondary | ICD-10-CM | POA: Insufficient documentation

## 2014-11-16 DIAGNOSIS — R0602 Shortness of breath: Secondary | ICD-10-CM | POA: Insufficient documentation

## 2014-11-16 DIAGNOSIS — M1612 Unilateral primary osteoarthritis, left hip: Secondary | ICD-10-CM | POA: Diagnosis not present

## 2014-11-16 DIAGNOSIS — M5136 Other intervertebral disc degeneration, lumbar region: Secondary | ICD-10-CM | POA: Diagnosis not present

## 2014-11-16 DIAGNOSIS — R0989 Other specified symptoms and signs involving the circulatory and respiratory systems: Secondary | ICD-10-CM

## 2014-11-16 DIAGNOSIS — J449 Chronic obstructive pulmonary disease, unspecified: Secondary | ICD-10-CM

## 2014-11-16 DIAGNOSIS — M6281 Muscle weakness (generalized): Secondary | ICD-10-CM | POA: Diagnosis not present

## 2014-11-17 ENCOUNTER — Ambulatory Visit (INDEPENDENT_AMBULATORY_CARE_PROVIDER_SITE_OTHER): Payer: Medicare Other | Admitting: Pulmonary Disease

## 2014-11-17 ENCOUNTER — Encounter: Payer: Self-pay | Admitting: Pulmonary Disease

## 2014-11-17 VITALS — BP 110/60 | HR 79 | Temp 97.3°F | Ht 61.5 in | Wt 143.8 lb

## 2014-11-17 DIAGNOSIS — J449 Chronic obstructive pulmonary disease, unspecified: Secondary | ICD-10-CM | POA: Diagnosis not present

## 2014-11-17 DIAGNOSIS — J9611 Chronic respiratory failure with hypoxia: Secondary | ICD-10-CM

## 2014-11-17 NOTE — Progress Notes (Signed)
Chief Complaint  Patient presents with  . Follow-up    pt following for COPD.  pt sates she is having an increase of SOB, she needs to be evaluated for oxygen. pt recently had a hip replacement 9/28 and pt states since than shes had a need for more oxygen.  pt currently using O2 QHS at 2 LPM. DME: [facility]    History of Present Illness: Priscilla Cantrell is a 79 y.o. female former smoker with GOLD 4 COPD, ACE related cough, and hypoxemia on 2 liters oxygen with sleep.  She had hip surgery in September.  She had trouble with her oxygen after surgery.  She has noticed her oxygen dropping with activity.  She is not having cough, wheeze, congestion, or chest pain.  She is getting around with a walker, but hoping she can graduate to a can.  She was seen by her PCP on 11/15/14 >> there was concern bronchitis, and she was started on zithromax.  Her CXR showed atelectasis.  Tests: Spirometry 01/10/09>>FEV1 0.97(52%), FEV1% 47 March 2012>>Daliresp started PSG 11/14/11 >> AHI 0.3 Echo 08/30/14 >> EF 60 to 89%, grade 1 diastolic dysfx, PAS 24 mmHG  PMHx >> Spinal stenosis, GERD, Hypothyroidism, Depression, Anxiety  PSHx, Medications, Allergies, Fhx, Shx reviewed.  Physical Exam: BP 110/60 mmHg  Pulse 79  Temp(Src) 97.3 F (36.3 C) (Oral)  Ht 5' 1.5" (1.562 m)  Wt 143 lb 12.8 oz (65.227 kg)  BMI 26.73 kg/m2  SpO2 94%  General - Thin HEENT - No sinus tenderness, no oral exudate, no LAN Cardiac - s1s2 no murmur Chest - prolonged exhalation, no wheeze/rales Abdomen - soft, nontender Extremities - no edema Skin - no rashes Neurologic - normal strength Psychiatric - normal mood, behavior   Assessment/Plan:  COPD with chronic bronchitis and emphysema. Plan: - continue spiriva, advair, daliresp and prn proair  Chronic respiratory failure with hypoxia. Discussion: She has increased oxygen needs since her surgery.  I think this is combination of atelectasis, pain medication use, and natural  progression of her emphysema.  She maintained her oxygen with ambulation today, but was only able to walk 1 lap due to hip pain. Plan: - she is to continue supplemental oxygen at night - she will monitor her pulse ox at home >> advised she could use her oxygen concentrator at home as needed if her oxygen saturation stays below 90% - she will call in few weeks if her status does not improve, and then will arrange for home oxygen assessment through [facility]  Upper airway cough syndrome. Plan: - prn claritin   Chesley Mires, MD [place] Pager:  [phone]

## 2014-11-17 NOTE — Patient Instructions (Signed)
Follow up in 3 months

## 2014-11-19 ENCOUNTER — Other Ambulatory Visit: Payer: Self-pay | Admitting: Pulmonary Disease

## 2014-11-20 DIAGNOSIS — M4806 Spinal stenosis, lumbar region: Secondary | ICD-10-CM | POA: Diagnosis not present

## 2014-11-20 DIAGNOSIS — M5030 Other cervical disc degeneration, unspecified cervical region: Secondary | ICD-10-CM | POA: Diagnosis not present

## 2014-11-20 DIAGNOSIS — M6281 Muscle weakness (generalized): Secondary | ICD-10-CM | POA: Diagnosis not present

## 2014-11-20 DIAGNOSIS — Z471 Aftercare following joint replacement surgery: Secondary | ICD-10-CM | POA: Diagnosis not present

## 2014-11-20 DIAGNOSIS — M5136 Other intervertebral disc degeneration, lumbar region: Secondary | ICD-10-CM | POA: Diagnosis not present

## 2014-11-20 DIAGNOSIS — M1612 Unilateral primary osteoarthritis, left hip: Secondary | ICD-10-CM | POA: Diagnosis not present

## 2014-11-22 DIAGNOSIS — Z471 Aftercare following joint replacement surgery: Secondary | ICD-10-CM | POA: Diagnosis not present

## 2014-11-22 DIAGNOSIS — M5030 Other cervical disc degeneration, unspecified cervical region: Secondary | ICD-10-CM | POA: Diagnosis not present

## 2014-11-22 DIAGNOSIS — M4806 Spinal stenosis, lumbar region: Secondary | ICD-10-CM | POA: Diagnosis not present

## 2014-11-22 DIAGNOSIS — M6281 Muscle weakness (generalized): Secondary | ICD-10-CM | POA: Diagnosis not present

## 2014-11-22 DIAGNOSIS — M5136 Other intervertebral disc degeneration, lumbar region: Secondary | ICD-10-CM | POA: Diagnosis not present

## 2014-11-22 DIAGNOSIS — M1612 Unilateral primary osteoarthritis, left hip: Secondary | ICD-10-CM | POA: Diagnosis not present

## 2014-11-24 ENCOUNTER — Other Ambulatory Visit: Payer: Self-pay | Admitting: Pulmonary Disease

## 2014-12-02 DIAGNOSIS — M1612 Unilateral primary osteoarthritis, left hip: Secondary | ICD-10-CM | POA: Diagnosis not present

## 2014-12-06 ENCOUNTER — Encounter: Payer: Self-pay | Admitting: Family Medicine

## 2014-12-06 ENCOUNTER — Ambulatory Visit (INDEPENDENT_AMBULATORY_CARE_PROVIDER_SITE_OTHER): Payer: Medicare Other | Admitting: Family Medicine

## 2014-12-06 VITALS — BP 131/74 | HR 77 | Temp 98.2°F | Resp 16 | Ht 61.5 in | Wt 141.0 lb

## 2014-12-06 DIAGNOSIS — J438 Other emphysema: Secondary | ICD-10-CM

## 2014-12-06 DIAGNOSIS — D62 Acute posthemorrhagic anemia: Secondary | ICD-10-CM

## 2014-12-06 DIAGNOSIS — M160 Bilateral primary osteoarthritis of hip: Secondary | ICD-10-CM | POA: Diagnosis not present

## 2014-12-06 DIAGNOSIS — I10 Essential (primary) hypertension: Secondary | ICD-10-CM

## 2014-12-06 LAB — CBC WITH DIFFERENTIAL/PLATELET
Basophils Absolute: 0 10*3/uL (ref 0.0–0.1)
Basophils Relative: 0.5 % (ref 0.0–3.0)
Eosinophils Absolute: 0.2 10*3/uL (ref 0.0–0.7)
Eosinophils Relative: 2.8 % (ref 0.0–5.0)
HCT: 36 % (ref 36.0–46.0)
Hemoglobin: 11.4 g/dL — ABNORMAL LOW (ref 12.0–15.0)
Lymphocytes Relative: 22 % (ref 12.0–46.0)
Lymphs Abs: 1.8 10*3/uL (ref 0.7–4.0)
MCHC: 31.7 g/dL (ref 30.0–36.0)
MCV: 81.9 fl (ref 78.0–100.0)
Monocytes Absolute: 0.7 10*3/uL (ref 0.1–1.0)
Monocytes Relative: 7.9 % (ref 3.0–12.0)
Neutro Abs: 5.6 10*3/uL (ref 1.4–7.7)
Neutrophils Relative %: 66.8 % (ref 43.0–77.0)
Platelets: 417 10*3/uL — ABNORMAL HIGH (ref 150.0–400.0)
RBC: 4.39 Mil/uL (ref 3.87–5.11)
RDW: 18.7 % — ABNORMAL HIGH (ref 11.5–15.5)
WBC: 8.4 10*3/uL (ref 4.0–10.5)

## 2014-12-06 LAB — PROTIME-INR
INR: 1 ratio (ref 0.8–1.0)
Prothrombin Time: 11.3 s (ref 9.6–13.1)

## 2014-12-06 LAB — FERRITIN: Ferritin: 68.9 ng/mL (ref 10.0–291.0)

## 2014-12-06 NOTE — Progress Notes (Signed)
Pre visit review using our clinic review tool, if applicable. No additional management support is needed unless otherwise documented below in the visit note. 

## 2014-12-06 NOTE — Progress Notes (Signed)
OFFICE VISIT  12/06/2014   CC:  Chief Complaint  Patient presents with  . Follow-up    3 week f/u   HPI:    Patient is a 79 y.o. Caucasian female who presents for 3 week f/u:  She got right THA 11/01/14.   CXR last visit showed stable lung hyperexpansion and right infrahilar atelectasis/scar without acute cardiopulm dz.   She has now gone down from TWO ASA 325mg  to one of these qd per ortho rec's. She had blood loss anemia from her surgery and I started her on iron at last visit. She saw [name], pulm MD, 11/17/14 and he felt her hypoxia and increased SOB was due to a combo of atelectasis, pain med use, and natural progression of her emphysema.  She was told to continue oxygen overnight and prn oxygen sat <90.  She is back on oxygen only hs now, breathing feeling well now.  Home PT is finished, just doing "self PT" now. Took iron as rx'd except a couple missed doses (once daily). No home bp monitoring.   Not taking pain meds in the last week.  Appetite not quite back yet, drinking well. Sleeping well last 3 nights.  Has some RLS but uses cold water on washcloth on thighs and this helps.     Past Medical History  Diagnosis Date  . Hypertension     EKG 03/2010 normal  . COPD (chronic obstructive pulmonary disease) ([place])     spirometry 01/10/09 FEV 0.97(52%), FEV1% 47  . Hypoxemia     O2 86% RA with exertion 01/10/09, 2 liters oxygen with exertion and sleep  . Pulmonary nodule     CT chest 12/10, June 2011.  No nodule on CT 06/2010.  Marland Kitchen Spinal stenosis     Symptomatic (neurogenic claudication)  . GERD (gastroesophageal reflux disease)   . Hypothyroidism     Hashimoto's, dx'd 1989  . Diverticulosis of colon     Procto '97 and colonoscopy 2002  . DDD (degenerative disc disease), cervical     1998 MRI C5-6 impingemt (left)  . DDD (degenerative disc disease), lumbar   . Spinal stenosis of lumbar region with neurogenic claudication     2008 MRI (L3-4, L4-5, L5-S1)  . Fibrocystic  breast disease     w/fibroadenoma.  Bx's showed NO atypia (Dr. Margot Chimes).  Mammo neg 2008.  Marland Kitchen Chest pain, non-cardiac     Cardiac CT showed no signif obstructive dz, mildly elevated calcium level (Dr. [name]).  . History of double vision     Ophthalmologist, Dr. Herbert Deaner, is further evaluating this with MRI  orbits and limited brain (myesthenia gravis testing neg 07/2010)  . Depression with anxiety 04/15/2011  . Asthma   . Dysplastic nevus of upper extremity 04/2014    R tricep ([name])  . Osteoarthritis, hip, bilateral     End stage; R THA  . History of home oxygen therapy     uses 2 liters at hs  . Family history of adverse reaction to anesthesia     mother died when patient age 25 receiving anesthesia for thyroid surgery    Past Surgical History  Procedure Laterality Date  . Cholecystectomy  2001  . Tubal ligation  1974  . Abdominal hysterectomy  1978    no BSO per pt--nonmalignant reasons  . Breast cyst excision      Last screening mammogram 10/2010 was normal.  . Appendectomy    . Tonsillectomy    . Colonoscopy  04/2005  NORMAL-recall 10 yrs  . Transthoracic echocardiogram  08/2014    EF 60-65%, grade I diast dysfxn  . Total hip arthroplasty Right 11/01/2014    Procedure: RIGHT TOTAL HIP ARTHROPLASTY;  Surgeon: Latanya Maudlin, MD;  Location: [facility];  Service: Orthopedics;  Laterality: Right;    Outpatient Prescriptions Prior to Visit  Medication Sig Dispense Refill  . ADVAIR DISKUS 250-50 MCG/DOSE AEPB INHALE 1 PUFF INTO THE LUNGS TWICE DAILY 60 each 2  . albuterol (PROAIR HFA) 108 (90 BASE) MCG/ACT inhaler Inhale 2 puffs into the lungs every 4 (four) hours as needed. (Patient taking differently: Inhale 2 puffs into the lungs every 4 (four) hours as needed for wheezing or shortness of breath. ) 1 Inhaler 5  . amLODipine (NORVASC) 5 MG tablet Take 1 tablet (5 mg total) by mouth daily. 90 tablet 3  . cetirizine (ZYRTEC) 10 MG tablet Take 1 tablet (10 mg total) by mouth daily.  30 tablet 11  . diazepam (VALIUM) 5 MG tablet Take 1 tablet (5 mg total) by mouth every 12 (twelve) hours as needed. (Patient taking differently: Take 5 mg by mouth every 12 (twelve) hours as needed for anxiety. ) 45 tablet 0  . HYDROcodone-acetaminophen (NORCO/VICODIN) 5-325 MG tablet Take 1-2 tablets by mouth every 4 (four) hours as needed (breakthrough pain). 80 tablet 0  . methocarbamol (ROBAXIN) 500 MG tablet Take 1 tablet (500 mg total) by mouth every 6 (six) hours as needed for muscle spasms. 40 tablet 1  . omeprazole (PRILOSEC) 20 MG capsule Take 20 mg by mouth daily.      . RESTASIS 0.05 % ophthalmic emulsion Apply 1 drop to eye 2 (two) times daily.  4  . roflumilast (DALIRESP) 500 MCG TABS tablet Take 1 tablet by mouth  daily 90 tablet 1  . SPIRIVA HANDIHALER 18 MCG inhalation capsule INHALE 1 CAPSULE VIA HANDIHALER EVERY DAY AT THE SAME TIME 30 capsule 3  . SYNTHROID 88 MCG tablet TAKE 1 TABLET BY MOUTH DAILY 30 tablet 6  . venlafaxine (EFFEXOR) 50 MG tablet Take 1 tablet (50 mg total) by mouth 2 (two) times daily. (Patient taking differently: Take 50 mg by mouth 2 (two) times daily. Takes 1 tab am, 1/2 tab pm) 60 tablet 3  . azithromycin (ZITHROMAX) 250 MG tablet 2 tabs po qd x 1d, then 1 tab po qd x 4d (Patient not taking: Reported on 12/06/2014) 6 tablet 0   No facility-administered medications prior to visit.    Allergies  Allergen Reactions  . Losartan Other (See Comments)    hyperkalemia  . Penicillins Hives, Swelling and Rash    Has patient had a PCN reaction causing immediate rash, facial/tongue/throat swelling, SOB or lightheadedness with hypotension: YES Has patient had a PCN reaction causing severe rash involving mucus membranes or skin necrosis: NO Has patient had a PCN reaction that required hospitalization NO Has patient had a PCN reaction occurring within the last 10 years: NO If all of the above answers are "NO", then may proceed with Cephalosporin use.   .  Cefixime [Kdc:Cefixime] Other (See Comments)    unspecified  . Indocin [Indomethacin] Other (See Comments)    Painful tongue and throat  . Sulfa Antibiotics Other (See Comments)    unspecified  . Ace Inhibitors Other (See Comments)    cough  . Statins Other (See Comments)    Myalgias     ROS As per HPI  PE: Blood pressure 131/74, pulse 77, temperature 98.2 F (36.8 C),  temperature source Oral, resp. rate 16, height 5' 1.5" (1.562 m), weight 141 lb (63.957 kg), SpO2 95 %. room air.  Gen: Alert, well appearing.  Patient is oriented to person, place, time, and situation. Color is pink, pt happy/smiling. CV: RRR, no m/r/g.   LUNGS: CTA bilat, nonlabored resps, good aeration in all lung fields. EXT: no clubbing, cyanosis, or edema.   LABS:  Lab Results  Component Value Date   WBC 10.6* 11/04/2014   HGB 8.8* 11/04/2014   HCT 27.5* 11/04/2014   MCV 81.6 11/04/2014   PLT 212 11/04/2014     Chemistry      Component Value Date/Time   NA 139 11/03/2014 0525   K 3.6 11/03/2014 0525   CL 105 11/03/2014 0525   CO2 28 11/03/2014 0525   BUN 11 11/03/2014 0525   CREATININE 0.65 11/03/2014 0525   CREATININE 0.68 06/02/2014 1518      Component Value Date/Time   CALCIUM 8.6* 11/03/2014 0525   ALKPHOS 98 10/26/2014 1500   AST 19 10/26/2014 1500   ALT 14 10/26/2014 1500   BILITOT 0.4 10/26/2014 1500       IMPRESSION AND PLAN:  1) S/P R THA, doing well.  Has some ongoing L hip pain from severe osteo in that hip as well, complicated lately by increased pressure on the left hip with gait---she is to use her walker more often when L hip bothers her more. Continue home "self PT".  2) COPD: The current medical regimen is effective;  continue present plan and medications.  3) HTN; The current medical regimen is effective;  continue present plan and medications.  4) Anemia from blood loss of surgery: has been on iron about 3 wks: will recheck CBC, IBC panel, and ferritin  today. Continue 325mg  FeSO4 qd for now.  Suspect her recent re-emergence of RLS is related to her low iron.  An After Visit Summary was printed and given to the patient.  FOLLOW UP: Return in about 3 months (around 03/08/2015) for routine chronic illness f/u (30 min).

## 2014-12-15 ENCOUNTER — Other Ambulatory Visit: Payer: Self-pay | Admitting: Pulmonary Disease

## 2014-12-19 ENCOUNTER — Other Ambulatory Visit: Payer: Self-pay | Admitting: Pulmonary Disease

## 2015-01-03 ENCOUNTER — Other Ambulatory Visit: Payer: Self-pay | Admitting: Family Medicine

## 2015-01-03 ENCOUNTER — Telehealth: Payer: Self-pay | Admitting: Family Medicine

## 2015-01-03 MED ORDER — FLUCONAZOLE 150 MG PO TABS: 150.0000 mg | ORAL_TABLET | Freq: Once | ORAL | Status: DC

## 2015-01-03 NOTE — Telephone Encounter (Signed)
Pt called and states she has a vaginal d/c that is itching and is greenish/yellowish in color. When her urine exits her body when she is urinating it burns. Due to just having Rt hip surgery pt can not get into exam table stirrups. She would like a RX for Premarin inserts./dh

## 2015-01-03 NOTE — Telephone Encounter (Signed)
Please advise. Thanks.  

## 2015-01-03 NOTE — Telephone Encounter (Signed)
Pls notify pt I'll send in rx for yeast infection.-thx

## 2015-01-04 NOTE — Telephone Encounter (Signed)
Pt advised and voiced understanding.   

## 2015-01-08 ENCOUNTER — Ambulatory Visit: Payer: Medicare Other | Admitting: Family Medicine

## 2015-01-09 ENCOUNTER — Telehealth: Payer: Self-pay | Admitting: Family Medicine

## 2015-01-09 ENCOUNTER — Ambulatory Visit (HOSPITAL_BASED_OUTPATIENT_CLINIC_OR_DEPARTMENT_OTHER)
Admission: RE | Admit: 2015-01-09 | Discharge: 2015-01-09 | Disposition: A | Discharge status: Home / Self Care | Payer: Medicare Other | Source: Ambulatory Visit | Attending: Family Medicine | Admitting: Family Medicine

## 2015-01-09 ENCOUNTER — Ambulatory Visit (INDEPENDENT_AMBULATORY_CARE_PROVIDER_SITE_OTHER): Payer: Medicare Other | Admitting: Family Medicine

## 2015-01-09 ENCOUNTER — Encounter: Payer: Self-pay | Admitting: Family Medicine

## 2015-01-09 VITALS — BP 115/74 | HR 84 | Temp 98.0°F | Resp 20 | Wt 148.5 lb

## 2015-01-09 DIAGNOSIS — M79604 Pain in right leg: Secondary | ICD-10-CM | POA: Diagnosis not present

## 2015-01-09 DIAGNOSIS — N390 Urinary tract infection, site not specified: Secondary | ICD-10-CM | POA: Diagnosis not present

## 2015-01-09 DIAGNOSIS — R8290 Unspecified abnormal findings in urine: Secondary | ICD-10-CM

## 2015-01-09 DIAGNOSIS — R319 Hematuria, unspecified: Secondary | ICD-10-CM

## 2015-01-09 DIAGNOSIS — I872 Venous insufficiency (chronic) (peripheral): Secondary | ICD-10-CM

## 2015-01-09 DIAGNOSIS — R8299 Other abnormal findings in urine: Secondary | ICD-10-CM | POA: Diagnosis not present

## 2015-01-09 DIAGNOSIS — M7989 Other specified soft tissue disorders: Secondary | ICD-10-CM

## 2015-01-09 DIAGNOSIS — M79661 Pain in right lower leg: Secondary | ICD-10-CM

## 2015-01-09 DIAGNOSIS — L03115 Cellulitis of right lower limb: Secondary | ICD-10-CM

## 2015-01-09 DIAGNOSIS — R350 Frequency of micturition: Secondary | ICD-10-CM | POA: Diagnosis not present

## 2015-01-09 LAB — POCT URINALYSIS DIPSTICK
Bilirubin, UA: NEGATIVE
Glucose, UA: NEGATIVE
Ketones, UA: NEGATIVE
Nitrite, UA: NEGATIVE
Protein, UA: NEGATIVE
Spec Grav, UA: 1.015
Urobilinogen, UA: 0.2
pH, UA: 5.5

## 2015-01-09 MED ORDER — NITROFURANTOIN MONOHYD MACRO 100 MG PO CAPS: 100.0000 mg | ORAL_CAPSULE | Freq: Two times a day (BID) | ORAL | Status: DC

## 2015-01-09 MED ORDER — DOXYCYCLINE HYCLATE 100 MG PO TABS: 100.0000 mg | ORAL_TABLET | Freq: Two times a day (BID) | ORAL | Status: DC

## 2015-01-09 NOTE — Telephone Encounter (Signed)
Patient would like to know if she can come by to give a urine sample today to check for an UTI. Urination is painful. She did mention she would not be able to get in to stirrups due to recent hip surgery.

## 2015-01-09 NOTE — Progress Notes (Signed)
Subjective:    Patient ID: Priscilla Cantrell, female    DOB: 1935-04-09, 79 y.o.   MRN: [id number]  HPI  Right leg swelling: Patient presents for an acute office visit for right leg swelling of greater than a week duration. Patient states 2 days ago it started to become worse, with more swelling and more pain. She states the pain is mostly located behind her right knee and into her calf. Patient reports that she had a right hip replacement at the end of September. She has been walking with a cane for support. She denies any trauma or falls. She does admit to walking/shopping just prior to the onset of her swelling. Patient denies fever, chills, nausea or vomit. Patient denies any shortness of breath or palpitations. She is taking 325 mg of aspirin daily. She is allergic to multiple antibiotics. She does have a history of venous insufficiency, and she is on amlodipine. She reports she had a similar presentation in July of this year, prior to her hip surgery that was concerning for a blood clot. She has no history of blood clots prior, and the venous study completed at that time of the right extremity was normal. Patient states the leg wasn't swollen or painful in July. Patient is a former smoker.  Dysuria: Patient presents for acute onset of dysuria a few days in duration. She reports increasing urinary frequency. She denies fever, chills, nausea, vomit or increase in low back pain. She is eating and drinking well. She has had urinary tract in infections in the past which have felt similar to this presentation. She states her last urinary tract infection was years ago. MR review, showed Escherichia coli pansensitive as urinary cultures. Patient does admit to wearing depends at night secondary to difficulty getting to the bathroom on time in the middle of the night. She states this is new for her. She also endorses occasional diarrhea or loose stools.  Former smoker   Past Medical History  Diagnosis Date   . Hypertension     EKG 03/2010 normal  . COPD (chronic obstructive pulmonary disease) ([place])     spirometry 01/10/09 FEV 0.97(52%), FEV1% 47  . Hypoxemia     O2 86% RA with exertion 01/10/09, 2 liters oxygen with exertion and sleep  . Pulmonary nodule     CT chest 12/10, June 2011.  No nodule on CT 06/2010.  Marland Kitchen Spinal stenosis     Symptomatic (neurogenic claudication)  . GERD (gastroesophageal reflux disease)   . Hypothyroidism     Hashimoto's, dx'd 1989  . Diverticulosis of colon     Procto '97 and colonoscopy 2002  . DDD (degenerative disc disease), cervical     1998 MRI C5-6 impingemt (left)  . DDD (degenerative disc disease), lumbar   . Spinal stenosis of lumbar region with neurogenic claudication     2008 MRI (L3-4, L4-5, L5-S1)  . Fibrocystic breast disease     w/fibroadenoma.  Bx's showed NO atypia (Dr. Margot Chimes).  Mammo neg 2008.  Marland Kitchen Chest pain, non-cardiac     Cardiac CT showed no signif obstructive dz, mildly elevated calcium level (Dr. [name]).  . History of double vision     Ophthalmologist, Dr. Herbert Deaner, is further evaluating this with MRI  orbits and limited brain (myesthenia gravis testing neg 07/2010)  . Depression with anxiety 04/15/2011  . Asthma   . Dysplastic nevus of upper extremity 04/2014    R tricep ([name])  . Osteoarthritis, hip, bilateral  End stage; R THA  . History of home oxygen therapy     uses 2 liters at hs  . Family history of adverse reaction to anesthesia     mother died when patient age 36 receiving anesthesia for thyroid surgery   Allergies  Allergen Reactions  . Losartan Other (See Comments)    hyperkalemia  . Penicillins Hives, Swelling and Rash    Has patient had a PCN reaction causing immediate rash, facial/tongue/throat swelling, SOB or lightheadedness with hypotension: YES Has patient had a PCN reaction causing severe rash involving mucus membranes or skin necrosis: NO Has patient had a PCN reaction that required hospitalization  NO Has patient had a PCN reaction occurring within the last 10 years: NO If all of the above answers are "NO", then may proceed with Cephalosporin use.   . Cefixime [Kdc:Cefixime] Other (See Comments)    unspecified  . Indocin [Indomethacin] Other (See Comments)    Painful tongue and throat  . Sulfa Antibiotics Other (See Comments)    unspecified  . Ace Inhibitors Other (See Comments)    cough  . Statins Other (See Comments)    Myalgias      Review of Systems Negative, with the exception of above mentioned in HPI     Objective:   Physical Exam  Skin:      BP 115/74 mmHg  Pulse 84  Temp(Src) 98 F (36.7 C) (Oral)  Resp 20  Wt 140 lb (63.504 kg)  SpO2 95% Gen: Afebrile. No acute distress. Nontoxic in appearance, well-developed, well-nourished, Caucasian female. Walking with a cane. A pleasant. HENT: AT. [place]. MMM.  Eyes:Pupils Equal Round Reactive to light, Extraocular movements intact,  Conjunctiva without redness, discharge or icterus. CV: RRR  Chest: CTAB, no wheeze or crackles Abd: Soft. Round. ND. Mild suprapubic tenderness. BS present.  MSK: No CVA tenderness bilaterally.  Right lower extremity: Circumferential swelling right ankle to knee. Taut/shiny skin anterior shin. +1 pitting edema. Moderate Tenderness to palpation posterior knee and superior calf. Negative Homans. Neurovascularly intact distally. Skin: No  purpura or petechiae. Erythema right ankle with mild irritation/rash.     Assessment & Plan:  Urinary frequency/Urinary tract infection with hematuria, site unspecified - POCT Urinalysis Dipstick--> positive for moderate blood and leukocytes. - Urine Culture ordered - Patient with multiple allergies, treating with Macrobid. - nitrofurantoin, macrocrystal-monohydrate, (MACROBID) 100 MG capsule; Take 1 capsule (100 mg total) by mouth 2 (two) times daily.  Dispense: 14 capsule; Refill: 0  Pain and swelling of right lower leg/rule out DVT versus cellulitis  of right ankle. - Patient with acute swelling and pain of her right lower extremity. Patient has a history of venous insufficiency in the past. She has also had concerns for deep vein thrombosis just this past July secondary to a similar presentation at the same leg. However patient states at that time her whole leg wasn't swollen and she didn't have the pain behind her knee like she does today. Patient does have tenderness to palpation posterior knee and swelling distally. There is also warmth and redness around the ankle. Patient with total hip replacement, in that extremity, approximately 3 months ago. She is taking 325 mg aspirin daily. - Uncertain infectious/cellulitis versus DVT. Will treat with doxycycline and obtain ultrasound right lower extremity. - US Venous Img Lower Unilateral Right; Future - doxycycline (VIBRA-TABS) 100 MG tablet; Take 1 tablet (100 mg total) by mouth 2 (two) times daily.  Dispense: 14 tablet; Refill: 0 - Rest, compression stockings,  elevation. Red flags discussed with patient. She is to follow-up in one week, sooner if needed.  Chronic venous insufficiency - Patient encouraged to obtain compression stockings for medical supply store and wear when on her feet or exercising. - Compression stockings Follow-up one week

## 2015-01-09 NOTE — Patient Instructions (Signed)
I have called in 2 antibiotics. Macrobid: for Urine infection Doxcycline: for leg infection I have ordered compression stockings for to get at a medical supply store to help decrease your swelling in your leg especially when walking/exercising.  I have ordered an [place] of your leg that will be set up with a day.  If you have increased swelling, increased pain or coldness/numbness in your leg please been seen immediately.

## 2015-01-09 NOTE — Telephone Encounter (Signed)
Pt advised that she will need ov. 30 min ov made with Dr. Raoul Pitch for UTI and ankle swelling.

## 2015-01-10 ENCOUNTER — Telehealth: Payer: Self-pay | Admitting: Family Medicine

## 2015-01-10 NOTE — Telephone Encounter (Signed)
Spoke with patient reviewed US results and instructions. Patient verbalized understanding .  

## 2015-01-10 NOTE — Telephone Encounter (Signed)
Please call pt, no evidence of blood clot on the Ultrasound.  Continue medications and follow next week, if not improving or worsening be seen sooner.  Rest, elevate and wear compression stockings.

## 2015-01-11 DIAGNOSIS — Z96641 Presence of right artificial hip joint: Secondary | ICD-10-CM | POA: Diagnosis not present

## 2015-01-11 DIAGNOSIS — Z471 Aftercare following joint replacement surgery: Secondary | ICD-10-CM | POA: Diagnosis not present

## 2015-01-11 LAB — URINE CULTURE: Colony Count: 25000

## 2015-01-19 ENCOUNTER — Encounter: Payer: Self-pay | Admitting: Family Medicine

## 2015-01-19 ENCOUNTER — Telehealth: Payer: Self-pay | Admitting: Family Medicine

## 2015-01-19 NOTE — Telephone Encounter (Signed)
Yes, appt with me would be good. Will you see if you can get record of last 2 office visits from Dr. Gladstone Lighter ([facility], I think)?-thx

## 2015-01-19 NOTE — Telephone Encounter (Signed)
Left message on home phone for pt to call and schedule apt for ankle swelling.  Diane can you please request the records Dr. Anitra Lauth is asking for below. Thanks.

## 2015-01-19 NOTE — Telephone Encounter (Signed)
Patient is still having painful swelling of R ankle. The skin is somewhat red. She has had 2 ultrasounds, Dr. Raoul Pitch ordered one & Dr. Gladstone Lighter ordered the other. Patient states that when she sits down she has to keep moving that foot. Patient is wondering if she should make an appointment with Dr. Anitra Lauth.

## 2015-01-19 NOTE — Telephone Encounter (Signed)
Please advise. Thanks.  

## 2015-01-22 NOTE — Telephone Encounter (Signed)
I've faxed a records request to [facility].

## 2015-01-23 ENCOUNTER — Ambulatory Visit: Payer: Medicare Other | Admitting: Family Medicine

## 2015-01-24 ENCOUNTER — Telehealth: Payer: Self-pay | Admitting: Family Medicine

## 2015-01-24 ENCOUNTER — Encounter (HOSPITAL_COMMUNITY): Payer: Self-pay | Admitting: Vascular Surgery

## 2015-01-24 ENCOUNTER — Emergency Department (HOSPITAL_COMMUNITY): Payer: Medicare Other

## 2015-01-24 ENCOUNTER — Inpatient Hospital Stay (HOSPITAL_COMMUNITY)
Admission: EM | Admit: 2015-01-24 | Discharge: 2015-01-27 | DRG: 190 | Disposition: A | Discharge status: Home Health | Payer: Medicare Other | Attending: Internal Medicine | Admitting: Internal Medicine

## 2015-01-24 ENCOUNTER — Ambulatory Visit: Payer: Medicare Other | Admitting: Family Medicine

## 2015-01-24 DIAGNOSIS — N6019 Diffuse cystic mastopathy of unspecified breast: Secondary | ICD-10-CM | POA: Diagnosis present

## 2015-01-24 DIAGNOSIS — J45909 Unspecified asthma, uncomplicated: Secondary | ICD-10-CM | POA: Diagnosis present

## 2015-01-24 DIAGNOSIS — I10 Essential (primary) hypertension: Secondary | ICD-10-CM | POA: Diagnosis not present

## 2015-01-24 DIAGNOSIS — M4806 Spinal stenosis, lumbar region: Secondary | ICD-10-CM | POA: Diagnosis present

## 2015-01-24 DIAGNOSIS — R509 Fever, unspecified: Secondary | ICD-10-CM | POA: Diagnosis not present

## 2015-01-24 DIAGNOSIS — M7918 Myalgia, other site: Secondary | ICD-10-CM

## 2015-01-24 DIAGNOSIS — Z880 Allergy status to penicillin: Secondary | ICD-10-CM | POA: Diagnosis not present

## 2015-01-24 DIAGNOSIS — Z79899 Other long term (current) drug therapy: Secondary | ICD-10-CM | POA: Diagnosis not present

## 2015-01-24 DIAGNOSIS — Z7982 Long term (current) use of aspirin: Secondary | ICD-10-CM

## 2015-01-24 DIAGNOSIS — N649 Disorder of breast, unspecified: Secondary | ICD-10-CM | POA: Diagnosis present

## 2015-01-24 DIAGNOSIS — F419 Anxiety disorder, unspecified: Secondary | ICD-10-CM | POA: Diagnosis present

## 2015-01-24 DIAGNOSIS — R0602 Shortness of breath: Secondary | ICD-10-CM | POA: Diagnosis not present

## 2015-01-24 DIAGNOSIS — M5136 Other intervertebral disc degeneration, lumbar region: Secondary | ICD-10-CM | POA: Diagnosis present

## 2015-01-24 DIAGNOSIS — J441 Chronic obstructive pulmonary disease with (acute) exacerbation: Principal | ICD-10-CM | POA: Diagnosis present

## 2015-01-24 DIAGNOSIS — R000 Tachycardia, unspecified: Secondary | ICD-10-CM | POA: Diagnosis not present

## 2015-01-24 DIAGNOSIS — Z96641 Presence of right artificial hip joint: Secondary | ICD-10-CM | POA: Diagnosis present

## 2015-01-24 DIAGNOSIS — K219 Gastro-esophageal reflux disease without esophagitis: Secondary | ICD-10-CM | POA: Diagnosis present

## 2015-01-24 DIAGNOSIS — J9621 Acute and chronic respiratory failure with hypoxia: Secondary | ICD-10-CM | POA: Diagnosis present

## 2015-01-24 DIAGNOSIS — R05 Cough: Secondary | ICD-10-CM | POA: Diagnosis not present

## 2015-01-24 DIAGNOSIS — F329 Major depressive disorder, single episode, unspecified: Secondary | ICD-10-CM | POA: Diagnosis present

## 2015-01-24 DIAGNOSIS — J9600 Acute respiratory failure, unspecified whether with hypoxia or hypercapnia: Secondary | ICD-10-CM

## 2015-01-24 DIAGNOSIS — E039 Hypothyroidism, unspecified: Secondary | ICD-10-CM | POA: Diagnosis present

## 2015-01-24 DIAGNOSIS — J9601 Acute respiratory failure with hypoxia: Secondary | ICD-10-CM | POA: Diagnosis not present

## 2015-01-24 DIAGNOSIS — M6281 Muscle weakness (generalized): Secondary | ICD-10-CM | POA: Diagnosis not present

## 2015-01-24 DIAGNOSIS — E876 Hypokalemia: Secondary | ICD-10-CM | POA: Diagnosis present

## 2015-01-24 DIAGNOSIS — Z87891 Personal history of nicotine dependence: Secondary | ICD-10-CM | POA: Diagnosis not present

## 2015-01-24 DIAGNOSIS — F418 Other specified anxiety disorders: Secondary | ICD-10-CM | POA: Diagnosis not present

## 2015-01-24 DIAGNOSIS — M791 Myalgia: Secondary | ICD-10-CM | POA: Diagnosis present

## 2015-01-24 DIAGNOSIS — M5030 Other cervical disc degeneration, unspecified cervical region: Secondary | ICD-10-CM | POA: Diagnosis present

## 2015-01-24 LAB — BLOOD GAS, ARTERIAL
Acid-base deficit: 2.5 mmol/L — ABNORMAL HIGH (ref 0.0–2.0)
Bicarbonate: 21.8 mEq/L (ref 20.0–24.0)
Drawn by: 270271
FIO2: 0.5
O2 Saturation: 97.7 %
Patient temperature: 98.6
TCO2: 22.9 mmol/L (ref 0–100)
pCO2 arterial: 37.4 mmHg (ref 35.0–45.0)
pH, Arterial: 7.384 (ref 7.350–7.450)
pO2, Arterial: 100 mmHg (ref 80.0–100.0)

## 2015-01-24 LAB — I-STAT ARTERIAL BLOOD GAS, ED
Acid-base deficit: 2 mmol/L (ref 0.0–2.0)
Bicarbonate: 20.7 mEq/L (ref 20.0–24.0)
O2 Saturation: 89 %
Patient temperature: 37
TCO2: 22 mmol/L (ref 0–100)
pCO2 arterial: 29.9 mmHg — ABNORMAL LOW (ref 35.0–45.0)
pH, Arterial: 7.447 (ref 7.350–7.450)
pO2, Arterial: 53 mmHg — ABNORMAL LOW (ref 80.0–100.0)

## 2015-01-24 LAB — URINALYSIS, ROUTINE W REFLEX MICROSCOPIC
Bilirubin Urine: NEGATIVE
Glucose, UA: NEGATIVE mg/dL
Ketones, ur: 40 mg/dL — AB
Leukocytes, UA: NEGATIVE
Nitrite: NEGATIVE
Protein, ur: 100 mg/dL — AB
Specific Gravity, Urine: 1.026 (ref 1.005–1.030)
pH: 5.5 (ref 5.0–8.0)

## 2015-01-24 LAB — BASIC METABOLIC PANEL
Anion gap: 12 (ref 5–15)
BUN: 13 mg/dL (ref 6–20)
CO2: 24 mmol/L (ref 22–32)
Calcium: 9.3 mg/dL (ref 8.9–10.3)
Chloride: 101 mmol/L (ref 101–111)
Creatinine, Ser: 0.79 mg/dL (ref 0.44–1.00)
GFR calc Af Amer: >60 mL/min (ref >60)
GFR calc non Af Amer: >60 mL/min (ref >60)
Glucose, Bld: 119 mg/dL — ABNORMAL HIGH (ref 65–99)
Potassium: 4 mmol/L (ref 3.5–5.1)
Sodium: 137 mmol/L (ref 135–145)

## 2015-01-24 LAB — HEPATIC FUNCTION PANEL
ALT: 22 U/L (ref 14–54)
AST: 39 U/L (ref 15–41)
Albumin: 3.5 g/dL (ref 3.5–5.0)
Alkaline Phosphatase: 73 U/L (ref 38–126)
Bilirubin, Direct: 0.2 mg/dL (ref 0.1–0.5)
Indirect Bilirubin: 0.4 mg/dL (ref 0.3–0.9)
Total Bilirubin: 0.6 mg/dL (ref 0.3–1.2)
Total Protein: 7.4 g/dL (ref 6.5–8.1)

## 2015-01-24 LAB — CBC
HCT: 39.9 % (ref 36.0–46.0)
Hemoglobin: 12.7 g/dL (ref 12.0–15.0)
MCH: 26.5 pg (ref 26.0–34.0)
MCHC: 31.8 g/dL (ref 30.0–36.0)
MCV: 83.3 fL (ref 78.0–100.0)
Platelets: 251 10*3/uL (ref 150–400)
RBC: 4.79 MIL/uL (ref 3.87–5.11)
RDW: 15.3 % (ref 11.5–15.5)
WBC: 16.6 10*3/uL — ABNORMAL HIGH (ref 4.0–10.5)

## 2015-01-24 LAB — URINE MICROSCOPIC-ADD ON

## 2015-01-24 LAB — BRAIN NATRIURETIC PEPTIDE: B Natriuretic Peptide: 78.4 pg/mL (ref 0.0–100.0)

## 2015-01-24 LAB — I-STAT TROPONIN, ED: Troponin i, poc: 0.02 ng/mL (ref 0.00–0.08)

## 2015-01-24 LAB — I-STAT CG4 LACTIC ACID, ED: Lactic Acid, Venous: 1.72 mmol/L (ref 0.5–2.0)

## 2015-01-24 MED ORDER — ASPIRIN 325 MG PO TABS
325.0000 mg | ORAL_TABLET | Freq: Every day | ORAL | Status: DC
  Administered 2015-01-25 – 2015-01-27 (×3): 325 mg via ORAL
  Filled 2015-01-24 (×3): qty 1

## 2015-01-24 MED ORDER — MOMETASONE FURO-FORMOTEROL FUM 100-5 MCG/ACT IN AERO
2.0000 | INHALATION_SPRAY | Freq: Two times a day (BID) | RESPIRATORY_TRACT | Status: DC
  Filled 2015-01-24: qty 8.8

## 2015-01-24 MED ORDER — TIOTROPIUM BROMIDE MONOHYDRATE 18 MCG IN CAPS
18.0000 ug | ORAL_CAPSULE | Freq: Every day | RESPIRATORY_TRACT | Status: DC
  Filled 2015-01-24: qty 5

## 2015-01-24 MED ORDER — VENLAFAXINE HCL 50 MG PO TABS
50.0000 mg | ORAL_TABLET | Freq: Two times a day (BID) | ORAL | Status: DC
  Administered 2015-01-24 – 2015-01-27 (×6): 50 mg via ORAL
  Filled 2015-01-24 (×7): qty 1

## 2015-01-24 MED ORDER — SODIUM CHLORIDE 0.9 % IV BOLUS (SEPSIS)
500.0000 mL | Freq: Once | INTRAVENOUS | Status: AC
  Administered 2015-01-24: 500 mL via INTRAVENOUS

## 2015-01-24 MED ORDER — ACETAMINOPHEN 650 MG RE SUPP: 650.0000 mg | Freq: Four times a day (QID) | RECTAL | Status: DC | PRN

## 2015-01-24 MED ORDER — LEVOFLOXACIN IN D5W 750 MG/150ML IV SOLN
750.0000 mg | Freq: Once | INTRAVENOUS | Status: AC
  Administered 2015-01-24: 750 mg via INTRAVENOUS
  Filled 2015-01-24: qty 150

## 2015-01-24 MED ORDER — ROFLUMILAST 500 MCG PO TABS
500.0000 ug | ORAL_TABLET | Freq: Every day | ORAL | Status: DC
  Administered 2015-01-25 – 2015-01-27 (×3): 500 ug via ORAL
  Filled 2015-01-24 (×3): qty 1

## 2015-01-24 MED ORDER — ACETAMINOPHEN 325 MG PO TABS: 650.0000 mg | ORAL_TABLET | Freq: Four times a day (QID) | ORAL | Status: DC | PRN

## 2015-01-24 MED ORDER — CYCLOSPORINE 0.05 % OP EMUL
1.0000 [drp] | Freq: Two times a day (BID) | OPHTHALMIC | Status: DC
  Administered 2015-01-24 – 2015-01-27 (×6): 1 [drp] via OPHTHALMIC
  Filled 2015-01-24 (×7): qty 1

## 2015-01-24 MED ORDER — METHYLPREDNISOLONE SODIUM SUCC 125 MG IJ SOLR
60.0000 mg | Freq: Four times a day (QID) | INTRAMUSCULAR | Status: DC
  Administered 2015-01-24 – 2015-01-25 (×3): 60 mg via INTRAVENOUS
  Filled 2015-01-24 (×3): qty 2

## 2015-01-24 MED ORDER — ALBUTEROL (5 MG/ML) CONTINUOUS INHALATION SOLN
10.0000 mg/h | INHALATION_SOLUTION | RESPIRATORY_TRACT | Status: DC
  Administered 2015-01-24: 10 mg/h via RESPIRATORY_TRACT
  Filled 2015-01-24: qty 20

## 2015-01-24 MED ORDER — METHOCARBAMOL 500 MG PO TABS: 500.0000 mg | ORAL_TABLET | Freq: Four times a day (QID) | ORAL | Status: DC | PRN

## 2015-01-24 MED ORDER — AMLODIPINE BESYLATE 5 MG PO TABS
5.0000 mg | ORAL_TABLET | Freq: Every day | ORAL | Status: DC
  Administered 2015-01-25 – 2015-01-27 (×3): 5 mg via ORAL
  Filled 2015-01-24 (×3): qty 1

## 2015-01-24 MED ORDER — PANTOPRAZOLE SODIUM 40 MG PO TBEC
40.0000 mg | DELAYED_RELEASE_TABLET | Freq: Every day | ORAL | Status: DC
  Administered 2015-01-25 – 2015-01-27 (×3): 40 mg via ORAL
  Filled 2015-01-24 (×3): qty 1

## 2015-01-24 MED ORDER — SODIUM CHLORIDE 0.9 % IV SOLN: 250.0000 mL | INTRAVENOUS | Status: DC | PRN

## 2015-01-24 MED ORDER — GUAIFENESIN ER 600 MG PO TB12
1200.0000 mg | ORAL_TABLET | Freq: Two times a day (BID) | ORAL | Status: DC
  Administered 2015-01-24 – 2015-01-27 (×6): 1200 mg via ORAL
  Filled 2015-01-24 (×6): qty 2

## 2015-01-24 MED ORDER — METHYLPREDNISOLONE SODIUM SUCC 125 MG IJ SOLR
125.0000 mg | Freq: Once | INTRAMUSCULAR | Status: AC
  Administered 2015-01-24: 125 mg via INTRAVENOUS
  Filled 2015-01-24: qty 2

## 2015-01-24 MED ORDER — ENOXAPARIN SODIUM 40 MG/0.4ML ~~LOC~~ SOLN
40.0000 mg | SUBCUTANEOUS | Status: DC
Start: 2015-01-24 — End: 2015-01-27
  Administered 2015-01-24 – 2015-01-26 (×3): 40 mg via SUBCUTANEOUS
  Filled 2015-01-24 (×3): qty 0.4

## 2015-01-24 MED ORDER — DIAZEPAM 5 MG PO TABS
2.5000 mg | ORAL_TABLET | Freq: Two times a day (BID) | ORAL | Status: DC | PRN
  Administered 2015-01-25: 2.5 mg via ORAL
  Filled 2015-01-24: qty 1

## 2015-01-24 MED ORDER — LEVOFLOXACIN IN D5W 750 MG/150ML IV SOLN
750.0000 mg | INTRAVENOUS | Status: DC
  Administered 2015-01-25 – 2015-01-26 (×2): 750 mg via INTRAVENOUS
  Filled 2015-01-24 (×4): qty 150

## 2015-01-24 MED ORDER — LEVOTHYROXINE SODIUM 88 MCG PO TABS
88.0000 ug | ORAL_TABLET | Freq: Every day | ORAL | Status: DC
  Administered 2015-01-25 – 2015-01-27 (×3): 88 ug via ORAL
  Filled 2015-01-24 (×3): qty 1

## 2015-01-24 MED ORDER — SODIUM CHLORIDE 0.9 % IJ SOLN: 3.0000 mL | INTRAMUSCULAR | Status: DC | PRN

## 2015-01-24 MED ORDER — SODIUM CHLORIDE 0.9 % IJ SOLN
3.0000 mL | Freq: Two times a day (BID) | INTRAMUSCULAR | Status: DC
  Administered 2015-01-24 – 2015-01-26 (×5): 3 mL via INTRAVENOUS

## 2015-01-24 MED ORDER — ONDANSETRON HCL 4 MG/2ML IJ SOLN: 4.0000 mg | Freq: Four times a day (QID) | INTRAMUSCULAR | Status: DC | PRN

## 2015-01-24 MED ORDER — IPRATROPIUM-ALBUTEROL 0.5-2.5 (3) MG/3ML IN SOLN
3.0000 mL | RESPIRATORY_TRACT | Status: DC
  Administered 2015-01-24 – 2015-01-25 (×5): 3 mL via RESPIRATORY_TRACT
  Filled 2015-01-24 (×5): qty 3

## 2015-01-24 MED ORDER — ONDANSETRON HCL 4 MG PO TABS: 4.0000 mg | ORAL_TABLET | Freq: Four times a day (QID) | ORAL | Status: DC | PRN

## 2015-01-24 MED ORDER — ALBUTEROL SULFATE (2.5 MG/3ML) 0.083% IN NEBU
5.0000 mg | INHALATION_SOLUTION | Freq: Once | RESPIRATORY_TRACT | Status: AC
  Administered 2015-01-24: 5 mg via RESPIRATORY_TRACT
  Filled 2015-01-24: qty 6

## 2015-01-24 NOTE — Progress Notes (Signed)
MD paged due to family wanting patient back off of Bipap after patient wanted to be put on it, being it helped her breath better. MD wants ABG post 1 hour of being on ventimask.

## 2015-01-24 NOTE — Telephone Encounter (Signed)
Noted  

## 2015-01-24 NOTE — ED Notes (Signed)
Pt reports to the ED for eval of productive green/yellow cough. She also reports associated SOB and bilateral flank pain from coughing. Pt has hx of COPD and asthma and she used her rescue inhaler which only moderately improved her SOB. Pt denies any CP. Pt wears 2 L of O2 via nasal cannula at night and was 90% on RA so EMS placed her on 2 L. Pt mildly tachycardic in the 100s-110s. Pt A&Ox4 and skin warm and dry. Pt unable to speak in full sentences.

## 2015-01-24 NOTE — ED Provider Notes (Signed)
CSN: [place]:[id number]     Arrival date & time 01/24/15  1200 History   First MD Initiated Contact with Patient 01/24/15 1205     Chief Complaint  Patient presents with  . Cough     (Consider location/radiation/quality/duration/timing/severity/associated sxs/prior Treatment) HPI Patient presents with 4 days of progressive shortness of breath, cough productive of yellow-green sputum and subjective fever. She also states she has been wheezing but has not used her rescue inhaler. She's had some right lower extremity swelling for the past few months since her right hip replacement in September. This is unchanged. This been evaluated with ultrasound and no DVT was demonstrated. Patient denies any orthopnea. No current chest pain. Patient states she has had some chest soreness with cough. Denies nausea, vomiting or diarrhea. No urinary symptoms. Patient wears 2 L of oxygen at night for COPD. Past Medical History  Diagnosis Date  . Hypertension     EKG 03/2010 normal  . COPD (chronic obstructive pulmonary disease) ([place])     spirometry 01/10/09 FEV 0.97(52%), FEV1% 47  . Hypoxemia     O2 86% RA with exertion 01/10/09, 2 liters oxygen with exertion and sleep  . Pulmonary nodule     CT chest 12/10, June 2011.  No nodule on CT 06/2010.  Marland Kitchen Spinal stenosis     Symptomatic (neurogenic claudication)  . GERD (gastroesophageal reflux disease)   . Hypothyroidism     Hashimoto's, dx'd 1989  . Diverticulosis of colon     Procto '97 and colonoscopy 2002  . DDD (degenerative disc disease), cervical     1998 MRI C5-6 impingemt (left)  . DDD (degenerative disc disease), lumbar   . Spinal stenosis of lumbar region with neurogenic claudication     2008 MRI (L3-4, L4-5, L5-S1)  . Fibrocystic breast disease     w/fibroadenoma.  Bx's showed NO atypia (Dr. Margot Chimes).  Mammo neg 2008.  Marland Kitchen Chest pain, non-cardiac     Cardiac CT showed no signif obstructive dz, mildly elevated calcium level (Dr. [name]).  . History of  double vision     Ophthalmologist, Dr. Herbert Deaner, is further evaluating this with MRI  orbits and limited brain (myesthenia gravis testing neg 07/2010)  . Depression with anxiety 04/15/2011  . Asthma   . Dysplastic nevus of upper extremity 04/2014    R tricep ([name])  . Osteoarthritis, hip, bilateral     End stage; R THA  . History of home oxygen therapy     uses 2 liters at hs  . Family history of adverse reaction to anesthesia     mother died when patient age 26 receiving anesthesia for thyroid surgery   Past Surgical History  Procedure Laterality Date  . Cholecystectomy  2001  . Tubal ligation  1974  . Abdominal hysterectomy  1978    no BSO per pt--nonmalignant reasons  . Breast cyst excision      Last screening mammogram 10/2010 was normal.  . Appendectomy    . Tonsillectomy    . Colonoscopy  04/2005    NORMAL-recall 10 yrs  . Transthoracic echocardiogram  08/2014    EF 60-65%, grade I diast dysfxn  . Total hip arthroplasty Right 11/01/2014    Procedure: RIGHT TOTAL HIP ARTHROPLASTY;  Surgeon: Latanya Maudlin, MD;  Location: [facility];  Service: Orthopedics;  Laterality: Right;   Family History  Problem Relation Age of Onset  . Goiter Mother   . Psoriasis Father     od liver  . Diabetes Daughter  Type 2  . Cancer Paternal Aunt     stomach?  . Stroke Maternal Grandfather   . Stroke Paternal Grandmother   . Hypertension Paternal Grandmother   . Hernia Paternal Grandmother   . Cancer Paternal Grandfather     lung  . Cirrhosis Father    Social History  Substance Use Topics  . Smoking status: Former Smoker -- 2.00 packs/day for 35 years    Types: Cigarettes    Quit date: 02/03/1990  . Smokeless tobacco: Never Used  . Alcohol Use: Yes     Comment: Rarely   OB History    No data available     Review of Systems  Constitutional: Positive for fever, appetite change and fatigue. Negative for chills.  HENT: Negative for congestion and sore throat.   Respiratory:  Positive for cough, shortness of breath and wheezing.   Cardiovascular: Positive for leg swelling. Negative for chest pain and palpitations.  Gastrointestinal: Negative for nausea, vomiting, abdominal pain and diarrhea.  Genitourinary: Negative for dysuria, frequency, hematuria and flank pain.  Musculoskeletal: Negative for back pain, neck pain and neck stiffness.  Skin: Negative for rash and wound.  Neurological: Positive for weakness (generalized). Negative for dizziness, numbness and headaches.  Psychiatric/Behavioral: Negative for confusion and agitation.  All other systems reviewed and are negative.     Allergies  Losartan; Penicillins; Cefixime; Indocin; Sulfa antibiotics; Ace inhibitors; and Statins  Home Medications   Prior to Admission medications   Medication Sig Start Date End Date Taking? Authorizing Provider  ADVAIR DISKUS 250-50 MCG/DOSE AEPB INHALE 1 PUFF INTO THE LUNGS TWICE DAILY 12/15/14  Yes Chesley Mires, MD  albuterol (PROAIR HFA) 108 (90 BASE) MCG/ACT inhaler Inhale 2 puffs into the lungs every 4 (four) hours as needed. Patient taking differently: Inhale 2 puffs into the lungs every 4 (four) hours as needed for wheezing or shortness of breath.  10/07/13  Yes [name], MD  amLODipine (NORVASC) 5 MG tablet Take 1 tablet (5 mg total) by mouth daily. 06/02/14  Yes [name], MD  aspirin 325 MG tablet Take 325 mg by mouth daily.   Yes Historical Provider, MD  cetirizine (ZYRTEC) 10 MG tablet Take 1 tablet (10 mg total) by mouth daily. 10/11/14  Yes [name], MD  diazepam (VALIUM) 5 MG tablet Take 1 tablet (5 mg total) by mouth every 12 (twelve) hours as needed. Patient taking differently: Take 2.5 mg by mouth every 12 (twelve) hours as needed for anxiety.  07/25/14  Yes Irene Pap, NP  ferrous fumarate (HEMOCYTE - 106 MG FE) 325 (106 FE) MG TABS tablet Take 1 tablet by mouth daily.   Yes Historical Provider, MD  methocarbamol (ROBAXIN) 500 MG tablet  Take 1 tablet (500 mg total) by mouth every 6 (six) hours as needed for muscle spasms. 11/02/14  Yes Amber Cecilio Asper, PA-C  omeprazole (PRILOSEC) 20 MG capsule Take 20 mg by mouth daily.     Yes Historical Provider, MD  RESTASIS 0.05 % ophthalmic emulsion Apply 1 drop to eye 2 (two) times daily. 03/14/14  Yes Historical Provider, MD  roflumilast (DALIRESP) 500 MCG TABS tablet Take 1 tablet by mouth  daily 06/06/14  Yes Chesley Mires, MD  SPIRIVA HANDIHALER 18 MCG inhalation capsule INHALE 1 CAPSULE VIA HANDIHALER EVERY DAY AT THE SAME TIME 12/20/14  Yes Chesley Mires, MD  SYNTHROID 88 MCG tablet TAKE 1 TABLET BY MOUTH DAILY 08/01/14  Yes [name], MD  venlafaxine [facility]) 50  MG tablet Take 1 tablet (50 mg total) by mouth 2 (two) times daily. Patient taking differently: Take 50 mg by mouth 2 (two) times daily. Takes 1 tab am, 1/2 tab pm 09/20/14  Yes [name], MD  doxycycline (VIBRA-TABS) 100 MG tablet Take 1 tablet (100 mg total) by mouth 2 (two) times daily. Patient not taking: Reported on 01/24/2015 01/09/15   Renee A Kuneff, DO  HYDROcodone-acetaminophen (NORCO/VICODIN) 5-325 MG tablet Take 1-2 tablets by mouth every 4 (four) hours as needed (breakthrough pain). 11/02/14   Amber Constable, PA-C  nitrofurantoin, macrocrystal-monohydrate, (MACROBID) 100 MG capsule Take 1 capsule (100 mg total) by mouth 2 (two) times daily. Patient not taking: Reported on 01/24/2015 01/09/15   Renee A Kuneff, DO   BP 136/65 mmHg  Pulse 78  Temp(Src) 98.6 F (37 C) (Oral)  Resp 20  Ht 5' 1.5" (1.562 m)  Wt 147 lb 14.9 oz (67.1 kg)  BMI 27.50 kg/m2  SpO2 97% Physical Exam  Constitutional: She is oriented to person, place, and time. She appears well-developed and well-nourished. She appears distressed.  HENT:  Head: Normocephalic and atraumatic.  Mouth/Throat: No oropharyngeal exudate.  Dry mucous membranes  Eyes: EOM are normal. Pupils are equal, round, and reactive to light.  Neck: Normal range of  motion. Neck supple.  Cardiovascular: Regular rhythm.  Exam reveals no gallop and no friction rub.   No murmur heard. Tachycardia  Pulmonary/Chest: No stridor. She is in respiratory distress. She has wheezes. She has rales. She exhibits no tenderness.  Patient with increased work of breathing. Tachypnea. Wheezing throughout with crackles in bilateral bases. Patient speaking in full sentences.  Abdominal: Soft. Bowel sounds are normal. She exhibits no distension and no mass. There is no tenderness. There is no rebound and no guarding.  Musculoskeletal: Normal range of motion. She exhibits edema. She exhibits no tenderness.  1+ right lower extremity edema. No calf tenderness. Distal pulses intact.  Neurological: She is alert and oriented to person, place, and time.  5/5 motor in all extremities. Sensation fully intact. Patient is alert and oriented. Following commands.  Skin: Skin is warm and dry. No rash noted. No erythema.  Psychiatric: She has a normal mood and affect. Her behavior is normal.  Nursing note and vitals reviewed.   ED Course  Procedures (including critical care time) Labs Review Labs Reviewed  BASIC METABOLIC PANEL - Abnormal; Notable for the following:    Glucose, Bld 119 (*)    All other components within normal limits  CBC - Abnormal; Notable for the following:    WBC 16.6 (*)    All other components within normal limits  URINALYSIS, ROUTINE W REFLEX MICROSCOPIC (NOT AT [facility]) - Abnormal; Notable for the following:    Hgb urine dipstick MODERATE (*)    Ketones, ur 40 (*)    Protein, ur 100 (*)    All other components within normal limits  URINE MICROSCOPIC-ADD ON - Abnormal; Notable for the following:    Squamous Epithelial / LPF 0-5 (*)    Bacteria, UA FEW (*)    Casts GRANULAR CAST (*)    All other components within normal limits  CBC - Abnormal; Notable for the following:    WBC 14.2 (*)    Hemoglobin 11.4 (*)    HCT 35.5 (*)    All other components within  normal limits  COMPREHENSIVE METABOLIC PANEL - Abnormal; Notable for the following:    Potassium 3.3 (*)    Glucose, Bld  190 (*)    Albumin 2.9 (*)    All other components within normal limits  BLOOD GAS, ARTERIAL - Abnormal; Notable for the following:    Acid-base deficit 2.5 (*)    All other components within normal limits  CBC - Abnormal; Notable for the following:    WBC 18.9 (*)    Hemoglobin 11.4 (*)    HCT 35.2 (*)    All other components within normal limits  BASIC METABOLIC PANEL - Abnormal; Notable for the following:    Glucose, Bld 135 (*)    BUN 21 (*)    All other components within normal limits  I-STAT ARTERIAL BLOOD GAS, ED - Abnormal; Notable for the following:    pCO2 arterial 29.9 (*)    pO2, Arterial 53.0 (*)    All other components within normal limits  CULTURE, BLOOD (ROUTINE X 2)  CULTURE, BLOOD (ROUTINE X 2)  CULTURE, EXPECTORATED SPUTUM-ASSESSMENT  MRSA PCR SCREENING  CULTURE, RESPIRATORY (NON-EXPECTORATED)  BRAIN NATRIURETIC PEPTIDE  HEPATIC FUNCTION PANEL  HIV ANTIBODY (ROUTINE TESTING)  I-STAT TROPOININ, ED  I-STAT CG4 LACTIC ACID, ED    Imaging Review Dg Chest Port 1 View  01/26/2015  CLINICAL DATA:  Shortness of Breath EXAM: PORTABLE CHEST 1 VIEW COMPARISON:  January 24, 2015 FINDINGS: There is slight atelectasis in the right base. Lungs elsewhere clear. Heart is upper normal in size with pulmonary vascularity within normal limits. There is atherosclerotic calcification in the aorta. No adenopathy. No bone lesions. IMPRESSION: Slight right base atelectasis. No edema or consolidation. No change in cardiac silhouette. Electronically Signed   By: Lowella Grip III M.D.   On: 01/26/2015 07:56   I have personally reviewed and evaluated these images and lab results as part of my medical decision-making.   EKG Interpretation   Date/Time:  Wednesday January 24 2015 12:09:44 EST Ventricular Rate:  101 PR Interval:  121 QRS Duration: 92 QT  Interval:  329 QTC Calculation: 426 R Axis:   21 Text Interpretation:  Sinus tachycardia Multiple ventricular premature  complexes Probable left atrial enlargement Abnormal R-wave progression,  early transition Baseline wander in lead(s) V6 ED PHYSICIAN INTERPRETATION  AVAILABLE IN [facility] Confirmed by TEST, Record ([id number]) on  01/25/2015 6:31:21 AM      MDM   Final diagnoses:  SOB (shortness of breath)   Patient with persistent dyspnea despite multiple nebulized treatments. No evidence of pneumonia on x-ray. Trial of BiPAP with improvement of fourth of breathing. Discussed with [facility] hospitalist and will admit.     Julianne Rice, MD 01/27/15 [id number]

## 2015-01-24 NOTE — ED Notes (Signed)
Pt placed on bipap and work of breathing decreased. Pt states she does not feel that she has to work as hard.

## 2015-01-24 NOTE — Telephone Encounter (Signed)
FYI

## 2015-01-24 NOTE — H&P (Signed)
[facility] History and Physical  Priscilla Cantrell [name] DOB: Feb 26, 1935 DOA: 01/24/2015  Referring physician: Dr Lita Mains - MED PCP: [name], MD   Chief Complaint: SOB, cough  HPI: Priscilla Cantrell is a 79 y.o. female  Cough and SOB. Started 4 days ago. Associated w/ wheezing and yellow greenish productive sputum. Intermittent improvement with breathing treatments. Getting worse. On 2 L nasal cannula at home. Right lower extremity swelling at baseline. Symptoms are worse with ambulation. Denies any chest pain, orthopnea, palpitations, syncope, fevers.    Review of Systems:  Constitutional:  No weight loss, night sweats, Fevers, chills HEENT:  No headaches, Difficulty swallowing,Tooth/dental problems,Sore throat, Cardio-vascular:  No chest pain, Orthopnea, dizziness, palpitations  GI:  No heartburn, indigestion, abdominal pain, nausea, vomiting, diarrhea, change in bowel habits, loss of appetite  Resp: Per HPI \\Skin :  no rash or lesions.  GU:  no dysuria, change in color of urine, no urgency or frequency. No flank pain.  Musculoskeletal:   No joint pain or swelling. No decreased range of motion. No back pain.  Psych:  No change in mood or affect. No depression or anxiety. No memory loss.  Neuro:  No change in sensation, unilateral strength, or cognitive abilities  All other systems were reviewed and are negative.  Past Medical History  Diagnosis Date  . Hypertension     EKG 03/2010 normal  . COPD (chronic obstructive pulmonary disease) ([place])     spirometry 01/10/09 FEV 0.97(52%), FEV1% 47  . Hypoxemia     O2 86% RA with exertion 01/10/09, 2 liters oxygen with exertion and sleep  . Pulmonary nodule     CT chest 12/10, June 2011.  No nodule on CT 06/2010.  Marland Kitchen Spinal stenosis     Symptomatic (neurogenic claudication)  . GERD (gastroesophageal reflux disease)   . Hypothyroidism     Hashimoto's, dx'd 1989  . Diverticulosis of colon     Procto '97  and colonoscopy 2002  . DDD (degenerative disc disease), cervical     1998 MRI C5-6 impingemt (left)  . DDD (degenerative disc disease), lumbar   . Spinal stenosis of lumbar region with neurogenic claudication     2008 MRI (L3-4, L4-5, L5-S1)  . Fibrocystic breast disease     w/fibroadenoma.  Bx's showed NO atypia (Dr. Margot Chimes).  Mammo neg 2008.  Marland Kitchen Chest pain, non-cardiac     Cardiac CT showed no signif obstructive dz, mildly elevated calcium level (Dr. [name]).  . History of double vision     Ophthalmologist, Dr. Herbert Deaner, is further evaluating this with MRI  orbits and limited brain (myesthenia gravis testing neg 07/2010)  . Depression with anxiety 04/15/2011  . Asthma   . Dysplastic nevus of upper extremity 04/2014    R tricep ([name])  . Osteoarthritis, hip, bilateral     End stage; R THA  . History of home oxygen therapy     uses 2 liters at hs  . Family history of adverse reaction to anesthesia     mother died when patient age 80 receiving anesthesia for thyroid surgery   Past Surgical History  Procedure Laterality Date  . Cholecystectomy  2001  . Tubal ligation  1974  . Abdominal hysterectomy  1978    no BSO per pt--nonmalignant reasons  . Breast cyst excision      Last screening mammogram 10/2010 was normal.  . Appendectomy    . Tonsillectomy    . Colonoscopy  04/2005    NORMAL-recall  10 yrs  . Transthoracic echocardiogram  08/2014    EF 60-65%, grade I diast dysfxn  . Total hip arthroplasty Right 11/01/2014    Procedure: RIGHT TOTAL HIP ARTHROPLASTY;  Surgeon: Latanya Maudlin, MD;  Location: [facility];  Service: Orthopedics;  Laterality: Right;   Social History:  reports that she quit smoking about 24 years ago. Her smoking use included Cigarettes. She has a 70 pack-year smoking history. She has never used smokeless tobacco. She reports that she drinks alcohol. She reports that she does not use illicit drugs.  Allergies  Allergen Reactions  . Losartan Other (See Comments)      hyperkalemia  . Penicillins Hives, Swelling and Rash    Has patient had a PCN reaction causing immediate rash, facial/tongue/throat swelling, SOB or lightheadedness with hypotension: YES Has patient had a PCN reaction causing severe rash involving mucus membranes or skin necrosis: NO Has patient had a PCN reaction that required hospitalization NO Has patient had a PCN reaction occurring within the last 10 years: NO If all of the above answers are "NO", then may proceed with Cephalosporin use.   . Cefixime [Kdc:Cefixime] Other (See Comments)    unspecified  . Indocin [Indomethacin] Other (See Comments)    Painful tongue and throat  . Sulfa Antibiotics Other (See Comments)    unspecified  . Ace Inhibitors Other (See Comments)    cough  . Statins Other (See Comments)    Myalgias     Family History  Problem Relation Age of Onset  . Goiter Mother   . Psoriasis Father     od liver  . Diabetes Daughter     Type 2  . Cancer Paternal Aunt     stomach?  . Stroke Maternal Grandfather   . Stroke Paternal Grandmother   . Hypertension Paternal Grandmother   . Hernia Paternal Grandmother   . Cancer Paternal Grandfather     lung  . Cirrhosis Father      Prior to Admission medications   Medication Sig Start Date End Date Taking? Authorizing Provider  ADVAIR DISKUS 250-50 MCG/DOSE AEPB INHALE 1 PUFF INTO THE LUNGS TWICE DAILY 12/15/14  Yes Chesley Mires, MD  albuterol (PROAIR HFA) 108 (90 BASE) MCG/ACT inhaler Inhale 2 puffs into the lungs every 4 (four) hours as needed. Patient taking differently: Inhale 2 puffs into the lungs every 4 (four) hours as needed for wheezing or shortness of breath.  10/07/13  Yes [name], MD  amLODipine (NORVASC) 5 MG tablet Take 1 tablet (5 mg total) by mouth daily. 06/02/14  Yes [name], MD  aspirin 325 MG tablet Take 325 mg by mouth daily.   Yes Historical Provider, MD  cetirizine (ZYRTEC) 10 MG tablet Take 1 tablet (10 mg total) by mouth  daily. 10/11/14  Yes [name], MD  diazepam (VALIUM) 5 MG tablet Take 1 tablet (5 mg total) by mouth every 12 (twelve) hours as needed. Patient taking differently: Take 2.5 mg by mouth every 12 (twelve) hours as needed for anxiety.  07/25/14  Yes Irene Pap, NP  ferrous fumarate (HEMOCYTE - 106 MG FE) 325 (106 FE) MG TABS tablet Take 1 tablet by mouth daily.   Yes Historical Provider, MD  methocarbamol (ROBAXIN) 500 MG tablet Take 1 tablet (500 mg total) by mouth every 6 (six) hours as needed for muscle spasms. 11/02/14  Yes Amber Cecilio Asper, PA-C  omeprazole (PRILOSEC) 20 MG capsule Take 20 mg by mouth daily.  Yes Historical Provider, MD  RESTASIS 0.05 % ophthalmic emulsion Apply 1 drop to eye 2 (two) times daily. 03/14/14  Yes Historical Provider, MD  roflumilast (DALIRESP) 500 MCG TABS tablet Take 1 tablet by mouth  daily 06/06/14  Yes Chesley Mires, MD  SPIRIVA HANDIHALER 18 MCG inhalation capsule INHALE 1 CAPSULE VIA HANDIHALER EVERY DAY AT THE SAME TIME 12/20/14  Yes Chesley Mires, MD  SYNTHROID 88 MCG tablet TAKE 1 TABLET BY MOUTH DAILY 08/01/14  Yes [name], MD  venlafaxine (EFFEXOR) 50 MG tablet Take 1 tablet (50 mg total) by mouth 2 (two) times daily. Patient taking differently: Take 50 mg by mouth 2 (two) times daily. Takes 1 tab am, 1/2 tab pm 09/20/14  Yes [name], MD  doxycycline (VIBRA-TABS) 100 MG tablet Take 1 tablet (100 mg total) by mouth 2 (two) times daily. Patient not taking: Reported on 01/24/2015 01/09/15   Renee A Kuneff, DO  HYDROcodone-acetaminophen (NORCO/VICODIN) 5-325 MG tablet Take 1-2 tablets by mouth every 4 (four) hours as needed (breakthrough pain). 11/02/14   Amber Constable, PA-C  nitrofurantoin, macrocrystal-monohydrate, (MACROBID) 100 MG capsule Take 1 capsule (100 mg total) by mouth 2 (two) times daily. Patient not taking: Reported on 01/24/2015 01/09/15   Ma Hillock, DO   Physical Exam: Filed Vitals:   01/24/15 1337 01/24/15 1400  01/24/15 1406 01/24/15 1430  BP: 134/71 144/65    Pulse: 106 104    Temp:    99.6 F (37.6 C)  TempSrc:    Oral  Resp: 18 31    SpO2: 92% 93% 95%     Wt Readings from Last 3 Encounters:  01/09/15 67.359 kg (148 lb 8 oz)  12/06/14 63.957 kg (141 lb)  11/17/14 65.227 kg (143 lb 12.8 oz)    General: Mild distress.  Eyes:  PERRL, EOMI, normal lids, iris ENT:  grossly normal hearing, lips & tongue Neck:  no LAD, masses or thyromegaly Cardiovascular: Difficult to hear due to CAT. RRR, Trace RLE  Respiratory: Coarse breath sounds throughout, wheezing throughout, diminished in right lung base, increased effort, on CAT. Abdomen:  soft, ntnd Skin:  no rash or induration seen on limited exam Musculoskeletal:  grossly normal tone BUE/BLE Psychiatric:  grossly normal mood and affect, speech fluent and appropriate Neurologic:  CN 2-12 grossly intact, moves all extremities in coordinated fashion.          Labs on Admission:  Basic Metabolic Panel:  Recent Labs Lab 01/24/15 1217  NA 137  K 4.0  CL 101  CO2 24  GLUCOSE 119*  BUN 13  CREATININE 0.79  CALCIUM 9.3   Liver Function Tests:  Recent Labs Lab 01/24/15 1217  AST 39  ALT 22  ALKPHOS 73  BILITOT 0.6  PROT 7.4  ALBUMIN 3.5   No results for input(s): LIPASE, AMYLASE in the last 168 hours. No results for input(s): AMMONIA in the last 168 hours. CBC:  Recent Labs Lab 01/24/15 1217  WBC 16.6*  HGB 12.7  HCT 39.9  MCV 83.3  PLT 251   Cardiac Enzymes: No results for input(s): CKTOTAL, CKMB, CKMBINDEX, TROPONINI in the last 168 hours.  BNP (last 3 results)  Recent Labs  01/24/15 1217  BNP 78.4    ProBNP (last 3 results) No results for input(s): PROBNP in the last 8760 hours.   Creatinine clearance cannot be calculated (Unknown ideal weight.)  CBG: No results for input(s): GLUCAP in the last 168 hours.  Radiological Exams on Admission:  Dg Chest Portable 1 View  01/24/2015  CLINICAL DATA:   Productive cough, shortness of breath, nausea, low-grade fever. EXAM: PORTABLE CHEST 1 VIEW COMPARISON:  11/16/2014 FINDINGS: Increasing opacity at the right lung base concerning for possible pneumonia. Left lung is clear. Underlying hyperinflation/COPD. Heart is normal size. No effusions or acute bony abnormality. IMPRESSION: COPD/hyperinflation. Increasing right basilar opacity concerning for pneumonia. Electronically Signed   By: Rolm Baptise M.D.   On: 01/24/2015 12:33      Assessment/Plan Principal Problem:   Acute respiratory failure (HCC) Active Problems:   Depression with anxiety   COPD exacerbation (HCC)   Musculoskeletal pain   History of right hip replacement   Essential hypertension   Acute Respiratory Failure: secondary to COPD exacerbation and CAP. New O2 requirement (4L at 96%).  - Stepdown - ABG - Mucinex - Strep Pneumo, Legionella Ag, sputum Cx - O2 prn - Solu-Medrol 60mg  every 6 - Duo nebs every 4 - Daliresp - Restart Spiriva and Advair in am  MSK pain: chronic, made worse s/p R hip replacement on 10/2014.  - PT/OT - continue robaxin  Depression/Anxiety: - continue Valium, Effexor.   Hypothyroid: - continue synthroid  GERD: - continue PPI  HTN: - continue norvasc  Code Status: FULL  DVT Prophylaxis: Lovenox Family Communication: None Disposition Plan: Pending Improvement    Ameriah Lint Lenna Sciara, MD Family Medicine [facility] [email or web address] Password [email or web address]

## 2015-01-24 NOTE — Telephone Encounter (Signed)
[name] pastor called. She had Manuela Schwartz go to [employer]. They are going to have an ambulance come get the patient.

## 2015-01-24 NOTE — ED Notes (Signed)
bIPAP and Abg inadvertantley acknowledged under tech screen

## 2015-01-24 NOTE — ED Notes (Signed)
Pt states she feels hot and appears to be working harder to breaTH. Crackling noted at bilateral anterior chest cool cloth applied. Dr. Lita Mains aware.

## 2015-01-24 NOTE — ED Notes (Signed)
Paged Dr. Linna Darner to [facility], RN for pt.

## 2015-01-25 LAB — CBC
HCT: 35.5 % — ABNORMAL LOW (ref 36.0–46.0)
Hemoglobin: 11.4 g/dL — ABNORMAL LOW (ref 12.0–15.0)
MCH: 26.8 pg (ref 26.0–34.0)
MCHC: 32.1 g/dL (ref 30.0–36.0)
MCV: 83.3 fL (ref 78.0–100.0)
Platelets: 239 10*3/uL (ref 150–400)
RBC: 4.26 MIL/uL (ref 3.87–5.11)
RDW: 15.2 % (ref 11.5–15.5)
WBC: 14.2 10*3/uL — ABNORMAL HIGH (ref 4.0–10.5)

## 2015-01-25 LAB — COMPREHENSIVE METABOLIC PANEL
ALT: 24 U/L (ref 14–54)
AST: 40 U/L (ref 15–41)
Albumin: 2.9 g/dL — ABNORMAL LOW (ref 3.5–5.0)
Alkaline Phosphatase: 63 U/L (ref 38–126)
Anion gap: 12 (ref 5–15)
BUN: 10 mg/dL (ref 6–20)
CO2: 22 mmol/L (ref 22–32)
Calcium: 9 mg/dL (ref 8.9–10.3)
Chloride: 102 mmol/L (ref 101–111)
Creatinine, Ser: 0.65 mg/dL (ref 0.44–1.00)
GFR calc Af Amer: >60 mL/min (ref >60)
GFR calc non Af Amer: >60 mL/min (ref >60)
Glucose, Bld: 190 mg/dL — ABNORMAL HIGH (ref 65–99)
Potassium: 3.3 mmol/L — ABNORMAL LOW (ref 3.5–5.1)
Sodium: 136 mmol/L (ref 135–145)
Total Bilirubin: 0.4 mg/dL (ref 0.3–1.2)
Total Protein: 6.6 g/dL (ref 6.5–8.1)

## 2015-01-25 LAB — EXPECTORATED SPUTUM ASSESSMENT W REFEX TO RESP CULTURE

## 2015-01-25 LAB — MRSA PCR SCREENING: MRSA by PCR: NEGATIVE

## 2015-01-25 LAB — HIV ANTIBODY (ROUTINE TESTING W REFLEX): HIV Screen 4th Generation wRfx: NONREACTIVE

## 2015-01-25 LAB — EXPECTORATED SPUTUM ASSESSMENT W GRAM STAIN, RFLX TO RESP C

## 2015-01-25 MED ORDER — SALINE SPRAY 0.65 % NA SOLN
1.0000 | NASAL | Status: DC | PRN
  Filled 2015-01-25: qty 44

## 2015-01-25 MED ORDER — FLUTICASONE PROPIONATE 50 MCG/ACT NA SUSP
2.0000 | Freq: Every day | NASAL | Status: DC
  Administered 2015-01-26 – 2015-01-27 (×2): 2 via NASAL
  Filled 2015-01-25: qty 16

## 2015-01-25 MED ORDER — LORATADINE 10 MG PO TABS
10.0000 mg | ORAL_TABLET | Freq: Every day | ORAL | Status: DC
  Administered 2015-01-26 – 2015-01-27 (×2): 10 mg via ORAL
  Filled 2015-01-25 (×2): qty 1

## 2015-01-25 MED ORDER — IPRATROPIUM-ALBUTEROL 0.5-2.5 (3) MG/3ML IN SOLN
3.0000 mL | Freq: Four times a day (QID) | RESPIRATORY_TRACT | Status: DC
  Administered 2015-01-25 – 2015-01-26 (×2): 3 mL via RESPIRATORY_TRACT
  Filled 2015-01-25 (×3): qty 3

## 2015-01-25 MED ORDER — ENSURE ENLIVE PO LIQD
237.0000 mL | Freq: Two times a day (BID) | ORAL | Status: DC
  Administered 2015-01-25 – 2015-01-27 (×2): 237 mL via ORAL

## 2015-01-25 MED ORDER — POTASSIUM CHLORIDE CRYS ER 20 MEQ PO TBCR
40.0000 meq | EXTENDED_RELEASE_TABLET | Freq: Once | ORAL | Status: AC
  Administered 2015-01-25: 40 meq via ORAL
  Filled 2015-01-25: qty 2

## 2015-01-25 MED ORDER — BUDESONIDE 0.25 MG/2ML IN SUSP
0.2500 mg | Freq: Two times a day (BID) | RESPIRATORY_TRACT | Status: DC
  Administered 2015-01-25 – 2015-01-27 (×5): 0.25 mg via RESPIRATORY_TRACT
  Filled 2015-01-25 (×5): qty 2

## 2015-01-25 MED ORDER — ALBUTEROL SULFATE (2.5 MG/3ML) 0.083% IN NEBU: 2.5000 mg | INHALATION_SOLUTION | RESPIRATORY_TRACT | Status: DC | PRN

## 2015-01-25 MED ORDER — METHYLPREDNISOLONE SODIUM SUCC 125 MG IJ SOLR
60.0000 mg | Freq: Three times a day (TID) | INTRAMUSCULAR | Status: DC
  Administered 2015-01-25 – 2015-01-26 (×3): 60 mg via INTRAVENOUS
  Filled 2015-01-25 (×3): qty 2

## 2015-01-25 NOTE — Evaluation (Signed)
Physical Therapy Evaluation Patient Details Name: Priscilla Cantrell MRN: [id number] DOB: [birth year] Today's Date: 01/25/2015   History of Present Illness  Patient is a 79 y/o female admitted with Acute Respiratory Failure: secondary to COPD exacerbation and CAP.  PMH positive for SS, DDD, COPD on nightly O2 and R THA 10/2014.  Clinical Impression  Patient presents with decreased mobility due to deficits listed in PT problem list.  Mainly limited due to respiratory issues and shakiness from medication, but will benefit from skilled PT in the acute setting to allow return to independent at home.  Do feel HHPT for transition beneficial as pt lives alone.    Follow Up Recommendations Home health PT    Equipment Recommendations  None recommended by PT    Recommendations for Other Services       Precautions / Restrictions Precautions Precautions: Fall Precaution Comments: watch O2 sats      Mobility  Bed Mobility Overal bed mobility: Needs Assistance Bed Mobility: Supine to Sit     Supine to sit: Supervision     General bed mobility comments: assist for lines/safety; cues for hand placement  Transfers Overall transfer level: Needs assistance Equipment used: Rolling walker (2 wheeled) Transfers: Sit to/from Stand Sit to Stand: Supervision         General transfer comment: for safety, but able to rise unaided  Ambulation/Gait Ambulation/Gait assistance: Supervision;Min guard Ambulation Distance (Feet): 120 Feet Assistive device: Rolling walker (2 wheeled) Gait Pattern/deviations: Step-through pattern;Decreased stride length     General Gait Details: demonstrates safe use of walker, shaky due to meds so walker support needed  Stairs            Wheelchair Mobility    Modified Rankin (Stroke Patients Only)       Balance Overall balance assessment: Needs assistance         Standing balance support: No upper extremity supported Standing balance-Leahy  Scale: Fair Standing balance comment: able to balance static unsupported, but noted shaky and walker needed for ambulation                             Pertinent Vitals/Pain Pain Assessment: No/denies pain  SpO2 range with mobility on 4L O2 77-94%    Home Living Family/patient expects to be discharged to:: Private residence Living Arrangements: Alone Available Help at Discharge: Family;Available PRN/intermittently Type of Home: House Home Access: Stairs to enter Entrance Stairs-Rails: None Entrance Stairs-Number of Steps: 2 Home Layout: Two level;Able to live on main level with bedroom/bathroom Home Equipment: Gilford Rile - 2 wheels;Cane - quad;Tub bench;Grab bars - tub/shower (unsure if quad or SPC)      Prior Function Level of Independence: Independent with assistive device(s)         Comments: using cane at home and occasionally no device     Hand Dominance   Dominant Hand: Right    Extremity/Trunk Assessment   Upper Extremity Assessment: Defer to OT evaluation           Lower Extremity Assessment: RLE deficits/detail;LLE deficits/detail RLE Deficits / Details: AROM WFL, strength hip flexion 3+/5, knee extension 4/5, ankle DF 4/5 tremors (pt reports medication related) LLE Deficits / Details: AROM WFL, strength hip flexion 4/5, knee extension 4+/5, ankle DF 4/5 tremors (pt reports medication related)     Communication   Communication: No difficulties  Cognition Arousal/Alertness: Awake/alert Behavior During Therapy: WFL for tasks assessed/performed Overall Cognitive Status: Within Functional Limits for  tasks assessed                      General Comments      Exercises        Assessment/Plan    PT Assessment Patient needs continued PT services  PT Diagnosis Generalized weakness   PT Problem List Decreased strength;Decreased activity tolerance;Decreased balance;Decreased mobility;Cardiopulmonary status limiting activity  PT Treatment  Interventions DME instruction;Gait training;Functional mobility training;Stair training;Balance training;Therapeutic activities;Therapeutic exercise   PT Goals (Current goals can be found in the Care Plan section) Acute Rehab PT Goals Patient Stated Goal: To return to independent PT Goal Formulation: With patient Time For Goal Achievement: 02/01/15 Potential to Achieve Goals: Good    Frequency Min 3X/week   Barriers to discharge Decreased caregiver support      Co-evaluation PT/OT/SLP Co-Evaluation/Treatment: Yes Reason for Co-Treatment: Other (comment) (Limited activity tolerance) PT goals addressed during session: Mobility/safety with mobility;Balance;Proper use of DME         End of Session Equipment Utilized During Treatment: Gait belt;Oxygen Activity Tolerance: Patient tolerated treatment well;Treatment limited secondary to medical complications (Comment) (pt w/ increased SOB and hypoxia per monitor, but poor correlation of pulse.) Patient left: in chair;with call bell/phone within reach           Time: 0940-1009 PT Time Calculation (min) (ACUTE ONLY): 29 min   Charges:   PT Evaluation $Initial PT Evaluation Tier I: 1 Procedure     PT G CodesReginia Naas Jan 31, 2015, 11:53 AM  Magda Kiel, [place] 01-31-15

## 2015-01-25 NOTE — Evaluation (Signed)
Occupational Therapy Evaluation Patient Details Name: Priscilla Cantrell MRN: [id number] DOB: 1935/10/14 Today's Date: 01/25/2015    History of Present Illness Patient is a 79 y/o female admitted with Acute Respiratory Failure: secondary to COPD exacerbation and CAP.  PMH positive for SS, DDD, COPD on nightly O2 and R THA 10/2014.   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 She will benefit from continued acute OT with follow HHOT to make sure she gets back to her PLOF.    Follow Up Recommendations  Home health OT    Equipment Recommendations  None recommended by OT       Precautions / Restrictions Precautions Precautions: Fall Precaution Comments: watch O2 sats (down into 70s with talking and walking at same time      Mobility Bed Mobility Overal bed mobility: Needs Assistance Bed Mobility: Supine to Sit     Supine to sit: Supervision     General bed mobility comments: assist for lines/safety; cues for hand placement  Transfers Overall transfer level: Needs assistance Equipment used: Rolling walker (2 wheeled) Transfers: Sit to/from Stand Sit to Stand: Supervision         General transfer comment: for safety, but able to rise unaided    Balance Overall balance assessment: Needs assistance         Standing balance support: Bilateral upper extremity supported Standing balance-Leahy Scale: Fair Standing balance comment: able to static stand unsupported but due to shakiness needed RW for amubulation                            ADL Overall ADL's : Needs assistance/impaired Eating/Feeding: Independent;Sitting   Grooming: Set up;Sitting   Upper Body Bathing: Set  up;Sitting   Lower Body Bathing: Supervison/ safety;Set up;Sit to/from stand   Upper Body Dressing : Set up;Sitting   Lower Body Dressing: Supervision/safety;Set up;Sit to/from stand   Toilet Transfer: Supervision/safety;Ambulation;RW   Toileting- Clothing Manipulation and Hygiene: Supervision/safety;Sit to/from stand                         Pertinent Vitals/Pain Pain Assessment: No/denies pain     Hand Dominance Right   Extremity/Trunk Assessment Upper Extremity Assessment Upper Extremity Assessment: Overall WFL for tasks assessed   Lower Extremity Assessment Lower Extremity Assessment: RLE deficits/detail;LLE deficits/detail RLE Deficits / Details: AROM WFL, strength hip flexion 3+/5, knee extension 4/5, ankle DF 4/5 tremors (pt reports medication related) LLE Deficits / Details: AROM WFL, strength hip flexion 4/5, knee extension 4+/5, ankle DF 4/5 tremors (pt reports medication related)       Communication Communication Communication: No difficulties   Cognition Arousal/Alertness: Awake/alert Behavior During Therapy: WFL for tasks assessed/performed Overall Cognitive Status: Within Functional Limits for tasks assessed                                Home Living Family/patient expects to be discharged to:: Private residence Living Arrangements: Alone Available Help at Discharge: Family;Available PRN/intermittently Type of Home: House Home Access: Stairs to enter Entrance Stairs-Number of Steps: 2 Entrance Stairs-Rails: None  Home Layout: Two level;Able to live on main level with bedroom/bathroom     Bathroom Shower/Tub: Walk-in shower;Door   Bathroom Toilet: Handicapped height Bathroom Accessibility: Yes   Home Equipment: Environmental consultant - 2 wheels;Cane - quad;Tub bench;Grab bars - tub/shower          Prior Functioning/Environment Level of Independence: Independent with assistive device(s)        Comments: using cane at home and occasionally  no device    OT Diagnosis: Generalized weakness   OT Problem List: Cardiopulmonary status limiting activity;Impaired balance (sitting and/or standing)   OT Treatment/Interventions: Self-care/ADL training;Patient/family education;Balance training;Energy conservation    OT Goals(Current goals can be found in the care plan section) Acute Rehab OT Goals Patient Stated Goal: to hopefully go home by Christmas OT Goal Formulation: With patient Time For Goal Achievement: 02/01/15 Potential to Achieve Goals: Good  OT Frequency: Min 2X/week   Barriers to D/C: Decreased caregiver support          Co-evaluation PT/OT/SLP Co-Evaluation/Treatment: Yes Reason for Co-Treatment: Other (comment) (limited activity tolerance) PT goals addressed during session: Mobility/safety with mobility;Balance;Proper use of DME OT goals addressed during session: ADL's and self-care;Strengthening/ROM      End of Session Equipment Utilized During Treatment: Gait belt;Rolling walker;Oxygen (4 liters (left on 3 liters at rest as we had found her)) Nurse Communication: Mobility status  Activity Tolerance: Patient tolerated treatment well Patient left: in chair;with call bell/phone within reach   Time: 0940-1009 OT Time Calculation (min): 29 min Charges:  OT General Charges $OT Visit: 1 Procedure OT Evaluation $Initial OT Evaluation Tier I: 1 Procedure  [name] [id number] 01/25/2015, 12:41 PM

## 2015-01-25 NOTE — Progress Notes (Signed)
   01/25/15 1100  Clinical Encounter Type  Visited With Patient  Visit Type Spiritual support  Referral From Nurse  Spiritual Encounters  Spiritual Needs Emotional  Stress Factors  Patient Stress Factors Loss of control;Health changes  Chaplain and patient discussed patient's health and her hopes of being home for Christmas. We chatted for a long period of time about nothing important. She may have wanted the company.

## 2015-01-25 NOTE — Progress Notes (Signed)
Utilization Review Completed.  

## 2015-01-25 NOTE — Progress Notes (Signed)
Initial Nutrition Assessment  DOCUMENTATION CODES:   Not applicable  INTERVENTION:   Ensure Enlive po BID, each supplement provides 350 kcal and 20 grams of protein  NUTRITION DIAGNOSIS:   Increased nutrient needs related to acute illness as evidenced by estimated needs  GOAL:   Patient will meet greater than or equal to 90% of their needs  MONITOR:   PO intake, Supplement acceptance, Labs, Weight trends, I & O's  REASON FOR ASSESSMENT:   Consult Assessment of nutrition requirement/status  ASSESSMENT:   79 y.o. female admitted with cough and SOB. Started 4 days ago. Associated w/ wheezing and yellow greenish productive sputum. Intermittent improvement with breathing treatments. Getting worse. On 2 L nasal cannula at home. Right lower extremity swelling at baseline. Symptoms are worse with ambulation. Denies any chest pain, orthopnea, palpitations, syncope, fevers.  Patient working with PT upon RD visit.  Currently on a Heart Healthy diet.  No % PO intake records available.  Nutrient needs increased given acute illness.  Would benefit from addition of oral nutrition supplements.  RD to order.  RD unable to complete Nutrition Focused Physical Exam at this time.  Diet Order:  Diet Heart Room service appropriate?: Yes; Fluid consistency:: Thin  Skin:  Reviewed, no issues  Last BM:  N/A  Height:   Ht Readings from Last 1 Encounters:  01/24/15 5' 1.5" (1.562 m)    Weight:   Wt Readings from Last 1 Encounters:  01/24/15 147 lb 14.9 oz (67.1 kg)    Ideal Body Weight:  48 kg  BMI:  Body mass index is 27.5 kg/(m^2).  Estimated Nutritional Needs:   Kcal:  1650-1850  Protein:  80-90 gm  Fluid:  1.6-1.8 L  EDUCATION NEEDS:   No education needs identified at this time  Arthur Holms, RD, LDN Pager #: [phone] After-Hours Pager #: [phone]

## 2015-01-25 NOTE — Progress Notes (Signed)
PATIENT DETAILS Name: ADINAH SNAVELY Age: 79 y.o. Sex: female Date of Birth: 18-Jul-1935 Admit Date: 01/24/2015 Admitting Physician Waldemar Dickens, MD [id number] H, MD  Subjective: Much better, transitioned to nasal cannula.  Assessment/Plan: Principal Problem: Acute on chronic hypoxic respiratory failure: Secondary to COPD exacerbation, required BiPAP on admission-subsequently transitioned to eventually mask and then to nasal cannula. On home O2 at night  Active Problems: COPD exacerbation: Significantly improved, moving air well-some scattered coarse rhonchi, decrease Solu-Medrol, continue empiric bronchodilators and levofloxacin. Follow clinical course  Possible pneumonia: Continue levofloxacin. Await blood cultures  Hypothyroidism: Continue Synthroid  Depression with anxiety: Mildly anxious and given COPD exacerbation-but otherwise stable-continue with Valium and Effexor.  GERD: Continue PPI  Hypertension: Controlled with Norvasc  Hypokalemia: Replete and recheck  Musculoskeletal pain: Chronic issue-appears stable  Disposition: Remain inpatient  Antimicrobial agents  See below  Anti-infectives    Start     Dose/Rate Route Frequency Ordered Stop   01/25/15 1300  levofloxacin (LEVAQUIN) IVPB 750 mg     750 mg 100 mL/hr over 90 Minutes Intravenous Every 24 hours 01/24/15 1506 01/30/15 1259   01/24/15 1245  levofloxacin (LEVAQUIN) IVPB 750 mg     750 mg 100 mL/hr over 90 Minutes Intravenous  Once 01/24/15 1240 01/24/15 1535      DVT Prophylaxis: Prophylactic Lovenox  Code Status: Full code   Family Communication None at bedside  Procedures: None  CONSULTS:  None  Time spent 30 minutes-Greater than 50% of this time was spent in counseling, explanation of diagnosis, planning of further management, and coordination of care.  MEDICATIONS: Scheduled Meds: . amLODipine  5 mg Oral Daily  . aspirin  325 mg Oral Daily  .  budesonide (PULMICORT) nebulizer solution  0.25 mg Nebulization BID  . cycloSPORINE  1 drop Both Eyes BID  . enoxaparin (LOVENOX) injection  40 mg Subcutaneous Q24H  . feeding supplement (ENSURE ENLIVE)  237 mL Oral BID BM  . guaiFENesin  1,200 mg Oral BID  . ipratropium-albuterol  3 mL Nebulization Q4H  . levofloxacin (LEVAQUIN) IV  750 mg Intravenous Q24H  . levothyroxine  88 mcg Oral QAC breakfast  . methylPREDNISolone (SOLU-MEDROL) injection  60 mg Intravenous Q6H  . pantoprazole  40 mg Oral Daily  . roflumilast  500 mcg Oral Daily  . sodium chloride  3 mL Intravenous Q12H  . tiotropium  18 mcg Inhalation Daily  . venlafaxine  50 mg Oral BID   Continuous Infusions: . albuterol 10 mg/hr (01/24/15 1502)   PRN Meds:.sodium chloride, acetaminophen **OR** acetaminophen, diazepam, methocarbamol, ondansetron **OR** ondansetron (ZOFRAN) IV, sodium chloride    PHYSICAL EXAM: Vital signs in last 24 hours: Filed Vitals:   01/25/15 0827 01/25/15 0850 01/25/15 1147 01/25/15 1211  BP:  123/66 107/68 118/58  Pulse:  83 82 81  Temp:  97 F (36.1 C) 97.7 F (36.5 C)   TempSrc:  Oral Oral   Resp:  18 22 20   Height:      Weight:      SpO2: 92% 100% 96% 94%    Weight change:  Filed Weights   01/24/15 1656  Weight: 67.1 kg (147 lb 14.9 oz)   Body mass index is 27.5 kg/(m^2).   Gen Exam: Awake and alert with clear speech.  mildly tremulous  Neck: Supple, No JVD.   Chest:Good air entry bilaterally-some scattered rhonchi.  CVS: S1 S2 Regular, no murmurs.  Abdomen: soft, BS +, non tender, non distended.  Extremities: no edema, lower extremities warm to touch. Neurologic: Non Focal.   Skin: No Rash.   Wounds: N/A.   Intake/Output from previous day:  Intake/Output Summary (Last 24 hours) at 01/25/15 1250 Last data filed at 01/25/15 0900  Gross per 24 hour  Intake    360 ml  Output   1350 ml  Net   -990 ml     LAB RESULTS: CBC  Recent Labs Lab 01/24/15 1217  01/25/15 0309  WBC 16.6* 14.2*  HGB 12.7 11.4*  HCT 39.9 35.5*  PLT 251 239  MCV 83.3 83.3  MCH 26.5 26.8  MCHC 31.8 32.1  RDW 15.3 15.2    Chemistries   Recent Labs Lab 01/24/15 1217 01/25/15 0309  NA 137 136  K 4.0 3.3*  CL 101 102  CO2 24 22  GLUCOSE 119* 190*  BUN 13 10  CREATININE 0.79 0.65  CALCIUM 9.3 9.0    CBG: No results for input(s): GLUCAP in the last 168 hours.  GFR Estimated Creatinine Clearance: 52.2 mL/min (by C-G formula based on Cr of 0.65).  Coagulation profile No results for input(s): INR, PROTIME in the last 168 hours.  Cardiac Enzymes No results for input(s): CKMB, TROPONINI, MYOGLOBIN in the last 168 hours.  Invalid input(s): CK  Invalid input(s): POCBNP No results for input(s): DDIMER in the last 72 hours. No results for input(s): HGBA1C in the last 72 hours. No results for input(s): CHOL, HDL, LDLCALC, TRIG, CHOLHDL, LDLDIRECT in the last 72 hours. No results for input(s): TSH, T4TOTAL, T3FREE, THYROIDAB in the last 72 hours.  Invalid input(s): FREET3 No results for input(s): VITAMINB12, FOLATE, FERRITIN, TIBC, IRON, RETICCTPCT in the last 72 hours. No results for input(s): LIPASE, AMYLASE in the last 72 hours.  Urine Studies No results for input(s): UHGB, CRYS in the last 72 hours.  Invalid input(s): UACOL, UAPR, USPG, UPH, UTP, UGL, UKET, UBIL, UNIT, UROB, ULEU, UEPI, UWBC, URBC, UBAC, CAST, UCOM, BILUA  MICROBIOLOGY: Recent Results (from the past 240 hour(s))  MRSA PCR Screening     Status: None   Collection Time: 01/24/15  5:07 PM  Result Value Ref Range Status   MRSA by PCR NEGATIVE NEGATIVE Final    Comment:        The GeneXpert MRSA Assay (FDA approved for NASAL specimens only), is one component of a comprehensive MRSA colonization surveillance program. It is not intended to diagnose MRSA infection nor to guide or monitor treatment for MRSA infections.     RADIOLOGY STUDIES/RESULTS: US Venous Img Lower  Unilateral Right  01/10/2015  CLINICAL DATA:  Right lower extremity pain and swelling for 1 week. EXAM: Right LOWER EXTREMITY VENOUS DOPPLER ULTRASOUND TECHNIQUE: Gray-scale sonography with graded compression, as well as color Doppler and duplex ultrasound were performed to evaluate the lower extremity deep venous systems from the level of the common femoral vein and including the common femoral, femoral, profunda femoral, popliteal and calf veins including the posterior tibial, peroneal and gastrocnemius veins when visible. The superficial great saphenous vein was also interrogated. Spectral Doppler was utilized to evaluate flow at rest and with distal augmentation maneuvers in the common femoral, femoral and popliteal veins. COMPARISON:  None. FINDINGS: Contralateral Common Femoral Vein: Respiratory phasicity is normal and symmetric with the symptomatic side. No evidence of thrombus. Normal compressibility. Common Femoral Vein: No evidence of thrombus. Normal compressibility, respiratory phasicity and response to augmentation. Saphenofemoral Junction: No evidence of thrombus. Normal  compressibility and flow on color Doppler imaging. Profunda Femoral Vein: No evidence of thrombus. Normal compressibility and flow on color Doppler imaging. Femoral Vein: No evidence of thrombus. Normal compressibility, respiratory phasicity and response to augmentation. Popliteal Vein: No evidence of thrombus. Normal compressibility, respiratory phasicity and response to augmentation. Calf Veins: No evidence of thrombus. Normal compressibility and flow on color Doppler imaging. Superficial Great Saphenous Vein: No evidence of thrombus. Normal compressibility and flow on color Doppler imaging. Venous Reflux:  None. Other Findings:  None. IMPRESSION: No evidence of deep venous thrombosis seen in right lower extremity. Electronically Signed   By: Marijo Conception, M.D.   On: 01/10/2015 08:04   Dg Chest Portable 1 View  01/24/2015   CLINICAL DATA:  Productive cough, shortness of breath, nausea, low-grade fever. EXAM: PORTABLE CHEST 1 VIEW COMPARISON:  11/16/2014 FINDINGS: Increasing opacity at the right lung base concerning for possible pneumonia. Left lung is clear. Underlying hyperinflation/COPD. Heart is normal size. No effusions or acute bony abnormality. IMPRESSION: COPD/hyperinflation. Increasing right basilar opacity concerning for pneumonia. Electronically Signed   By: Rolm Baptise M.D.   On: 01/24/2015 12:33    Oren Binet, MD  [facility] Pager:[phone]  If 7PM-7AM, please contact night-coverage [email or web address] Password [email or web address] 01/25/2015, 12:50 PM   LOS: 1 day

## 2015-01-26 ENCOUNTER — Inpatient Hospital Stay (HOSPITAL_COMMUNITY): Payer: Medicare Other

## 2015-01-26 DIAGNOSIS — J9601 Acute respiratory failure with hypoxia: Secondary | ICD-10-CM

## 2015-01-26 LAB — CBC
HCT: 35.2 % — ABNORMAL LOW (ref 36.0–46.0)
Hemoglobin: 11.4 g/dL — ABNORMAL LOW (ref 12.0–15.0)
MCH: 27 pg (ref 26.0–34.0)
MCHC: 32.4 g/dL (ref 30.0–36.0)
MCV: 83.2 fL (ref 78.0–100.0)
Platelets: 290 10*3/uL (ref 150–400)
RBC: 4.23 MIL/uL (ref 3.87–5.11)
RDW: 15.2 % (ref 11.5–15.5)
WBC: 18.9 10*3/uL — ABNORMAL HIGH (ref 4.0–10.5)

## 2015-01-26 LAB — BASIC METABOLIC PANEL
Anion gap: 11 (ref 5–15)
BUN: 21 mg/dL — ABNORMAL HIGH (ref 6–20)
CO2: 23 mmol/L (ref 22–32)
Calcium: 9.6 mg/dL (ref 8.9–10.3)
Chloride: 103 mmol/L (ref 101–111)
Creatinine, Ser: 0.81 mg/dL (ref 0.44–1.00)
GFR calc Af Amer: >60 mL/min (ref >60)
GFR calc non Af Amer: >60 mL/min (ref >60)
Glucose, Bld: 135 mg/dL — ABNORMAL HIGH (ref 65–99)
Potassium: 4.2 mmol/L (ref 3.5–5.1)
Sodium: 137 mmol/L (ref 135–145)

## 2015-01-26 MED ORDER — METHYLPREDNISOLONE SODIUM SUCC 125 MG IJ SOLR
60.0000 mg | Freq: Two times a day (BID) | INTRAMUSCULAR | Status: DC
  Administered 2015-01-26 – 2015-01-27 (×2): 60 mg via INTRAVENOUS
  Filled 2015-01-26 (×2): qty 2

## 2015-01-26 MED ORDER — FUROSEMIDE 40 MG PO TABS
40.0000 mg | ORAL_TABLET | Freq: Once | ORAL | Status: AC
  Administered 2015-01-26: 40 mg via ORAL
  Filled 2015-01-26: qty 1

## 2015-01-26 MED ORDER — CALCIUM CARBONATE ANTACID 500 MG PO CHEW
1.0000 | CHEWABLE_TABLET | Freq: Four times a day (QID) | ORAL | Status: DC | PRN
  Administered 2015-01-26: 200 mg via ORAL
  Filled 2015-01-26: qty 1

## 2015-01-26 MED ORDER — IPRATROPIUM-ALBUTEROL 0.5-2.5 (3) MG/3ML IN SOLN: 3.0000 mL | Freq: Four times a day (QID) | RESPIRATORY_TRACT | Status: DC | PRN

## 2015-01-26 NOTE — Progress Notes (Signed)
SATURATION QUALIFICATIONS: (This note is used to comply with regulatory documentation for home oxygen)  Patient Saturations on Room Air at Rest = 91-94%  Patient Saturations on Room Air while Ambulating = 84-87%  Patient Saturations on 4 Liters of oxygen while Ambulating = 87-92% (84-88% on 3LO2)  Please briefly explain why patient needs home oxygen:Pt will need home O2 with activity as well as at night.  Pt has a lot of questions for O2 company such as how to get a portable tank and extra tubing.  Thanks.  [place] [phone] (pager)

## 2015-01-26 NOTE — Care Management Note (Signed)
Case Management Note  Patient Details  Name: SAMELLA SISKO MRN: [id number] Date of Birth: 20-Jul-1935  Subjective/Objective:    Patient lives alone, she has home oxygen that she uses at night but she would like to have portable oxygen that she can take on her shoulder,explained to patient that they will have to qualify her for that at home, NCM informed Jermaine of this information and he states she will have to let them know to come out to qualify her for this.  NCM offered choice for [facility] services, patient refused stating she was just in a facility for her hip surgery and she did not like that, NCM explained this is not a facility [facility] services will come out to patient's home to work with her at home  2 -3 times a week for 30- 40 minutes, patient states she has her niece who can do that with her.                  Action/Plan:   Expected Discharge Date:                  Expected Discharge Plan:  [address]  In-House Referral:     Discharge planning Services  CM Consult  Post Acute Care Choice:  Home Health Choice offered to:  Patient  DME Arranged:    DME Agency:     HH Arranged:  Patient Refused [place] Agency:     Status of Service:  In process, will continue to follow  Medicare Important Message Given:    Date Medicare IM Given:    Medicare IM give by:    Date Additional Medicare IM Given:    Additional Medicare Important Message give by:     If discussed at [place] of Stay Meetings, dates discussed:    Additional Comments:  Zenon Mayo, RN 01/26/2015, 2:57 PM

## 2015-01-26 NOTE — Progress Notes (Signed)
NURSING PROGRESS NOTE  Priscilla Cantrell [id number] Transfer Data: 01/26/2015 11:09 AM Attending Provider: Thurnell Lose, MD [id number] H, MD Code Status: FULL   Priscilla Cantrell is a 79 y.o. female patient transferred from [place]  -No acute distress noted.  -No complaints of shortness of breath.  -No complaints of chest pain.   Cardiac Monitoring: Box # 24 in place. Cardiac monitor yields:normal sinus rhythm.  Last Documented Vital Signs: Blood pressure 133/60, pulse 89, temperature 97.9 F (36.6 C), temperature source Oral, resp. rate 20, height 5' 1.5" (1.562 m), weight 67.1 kg (147 lb 14.9 oz), SpO2 91 %.  IV Fluids:  IV in place, occlusive dsg intact without redness, IV cath antecubital left, condition patent and no redness none.   Allergies:  Losartan; Penicillins; Cefixime; Indocin; Sulfa antibiotics; Ace inhibitors; and Statins  Past Medical History:   has a past medical history of Hypertension; COPD (chronic obstructive pulmonary disease) ([place]); Hypoxemia; Pulmonary nodule; Spinal stenosis; GERD (gastroesophageal reflux disease); Hypothyroidism; Diverticulosis of colon; DDD (degenerative disc disease), cervical; DDD (degenerative disc disease), lumbar; Spinal stenosis of lumbar region with neurogenic claudication; Fibrocystic breast disease; Chest pain, non-cardiac; History of double vision; Depression with anxiety (04/15/2011); Asthma; Dysplastic nevus of upper extremity (04/2014); Osteoarthritis, hip, bilateral; History of home oxygen therapy; and Family history of adverse reaction to anesthesia.  Past Surgical History:   has past surgical history that includes Cholecystectomy (2001); Tubal ligation (1974); Abdominal hysterectomy (1978); Breast cyst excision; Appendectomy; Tonsillectomy; Colonoscopy (04/2005); transthoracic echocardiogram (08/2014); and Total hip arthroplasty (Right, 11/01/2014).  Social History:   reports that she quit smoking about 24 years ago. Her smoking  use included Cigarettes. She has a 70 pack-year smoking history. She has never used smokeless tobacco. She reports that she drinks alcohol. She reports that she does not use illicit drugs.  Skin: intact  Patient/Family orientated to room. Information packet given to patient/family. Admission inpatient armband information verified with patient/family to include name and date of birth and placed on patient arm. Side rails up x 2, fall assessment and education completed with patient/family. Patient/family able to verbalize understanding of risk associated with falls and verbalized understanding to call for assistance before getting out of bed. Call light within reach. Patient/family able to voice and demonstrate understanding of unit orientation instructions.

## 2015-01-26 NOTE — Progress Notes (Signed)
Physical Therapy Treatment Patient Details Name: Priscilla Cantrell MRN: [id number] DOB: [birth year] Today's Date: 01/26/2015    History of Present Illness Patient is a 79 y/o female admitted with Acute Respiratory Failure: secondary to COPD exacerbation and CAP.  PMH positive for SS, DDD, COPD on nightly O2 and R THA 10/2014.    PT Comments    Pt admitted with above diagnosis. Pt currently with functional limitations due to the deficits listed below (see PT Problem List). Pt was able to ambulate with supervision with good stability with RW but did desat unless on 4LO2. Pt worried that she will not have a portable tank that she can carry as well as tubing as she was on O2 at night PTA and now will be on it 24 hours.    Will continue to follow acutely.  Pt will benefit from skilled PT to increase their independence and safety with mobility to allow discharge to the venue listed below.    Follow Up Recommendations  Home health PT, [facility]     Equipment Recommendations  None recommended by PT    Recommendations for Other Services       Precautions / Restrictions Precautions Precautions: Fall Precaution Comments: watch O2 sats (down into 80s with talking and walking at same time    Mobility  Bed Mobility Overal bed mobility: Needs Assistance Bed Mobility: Supine to Sit     Supine to sit: Supervision     General bed mobility comments: assist for lines/safety; cues for hand placement  Transfers Overall transfer level: Needs assistance Equipment used: Rolling walker (2 wheeled) Transfers: Sit to/from Stand Sit to Stand: Supervision         General transfer comment: for safety, but able to rise unaided  Ambulation/Gait Ambulation/Gait assistance: Supervision Ambulation Distance (Feet): 250 Feet Assistive device: Rolling walker (2 wheeled) Gait Pattern/deviations: Step-through pattern;Decreased stride length   Gait velocity interpretation: <1.8 ft/sec, indicative of risk for  recurrent falls General Gait Details: demonstrates safe use of walker   Stairs            Wheelchair Mobility    Modified Rankin (Stroke Patients Only)       Balance Overall balance assessment: Needs assistance Sitting-balance support: Feet supported;No upper extremity supported Sitting balance-Leahy Scale: Good     Standing balance support: Bilateral upper extremity supported;During functional activity Standing balance-Leahy Scale: Fair Standing balance comment: can stand statically unsupported                    Cognition Arousal/Alertness: Awake/alert Behavior During Therapy: WFL for tasks assessed/performed Overall Cognitive Status: Within Functional Limits for tasks assessed                      Exercises General Exercises - Lower Extremity Ankle Circles/Pumps: AROM;Both;10 reps;Supine Long Arc Quad: AROM;Both;5 reps;Seated    General Comments        Pertinent Vitals/Pain Pain Assessment: No/denies pain      Home Living   SATURATION QUALIFICATIONS: (This note is used to comply with regulatory documentation for home oxygen)  Patient Saturations on Room Air at Rest = 91-94%  Patient Saturations on Room Air while Ambulating = 84-87%  Patient Saturations on 4 Liters of oxygen while Ambulating = 87-92% (84-88% on 3LO2)  Please briefly explain why patient needs home oxygen:Pt will need home O2 with activity as well as at night.  Pt has a lot of questions for O2 company such as how to get  a portable tank and extra tubing.                    Prior Function            PT Goals (current goals can now be found in the care plan section) Progress towards PT goals: Progressing toward goals    Frequency  Min 3X/week    PT Plan Current plan remains appropriate    Co-evaluation             End of Session Equipment Utilized During Treatment: Gait belt;Oxygen Activity Tolerance: Patient tolerated treatment well Patient left:  in chair;with call bell/phone within reach;with chair alarm set;with family/visitor present     Time: [id number] PT Time Calculation (min) (ACUTE ONLY): 32 min  Charges:  $Gait Training: 23-37 mins                    G Codes:      Terika Pillard F 14-Feb-2015, 1:20 PM [employer] [facility] [phone] (pager)

## 2015-01-26 NOTE — Progress Notes (Signed)
Occupational Therapy Treatment Patient Details Name: Priscilla Cantrell MRN: [id number] DOB: 1935/03/17 Today's Date: 01/26/2015    History of present illness Patient is a 79 y/o female admitted with Acute Respiratory Failure: secondary to COPD exacerbation and CAP.  PMH positive for SS, DDD, COPD on nightly O2 and R THA 10/2014.   OT comments  Patient is making progress towards OT goals, pt will continue to benefit from acute OT during hospital stay and HHOT post acute. Pt requires min verbal cues for energy conservation and overall safety with use of RW.  Sats ranged from 86% - 90% on RA. With supplemental 02, sats =90% or greater.    Follow Up Recommendations  Home health OT    Equipment Recommendations  None recommended by OT    Recommendations for Other Services  None at this time   Precautions / Restrictions Precautions Precautions: Fall Precaution Comments: watch O2 sats (down into 80s with talking and walking at same time Restrictions Weight Bearing Restrictions: No    Mobility Bed Mobility Overal bed mobility: Needs Assistance Bed Mobility: Supine to Sit     Supine to sit: Supervision     General bed mobility comments: Pt found seated in recliner upon OT entering/exiting room  Transfers Overall transfer level: Needs assistance Equipment used: Rolling walker (2 wheeled) Transfers: Sit to/from Stand Sit to Stand: Supervision General transfer comment: for safety, but able to rise unaided    Balance Overall balance assessment: Needs assistance Sitting-balance support: No upper extremity supported;Feet supported Sitting balance-Leahy Scale: Good     Standing balance support: Bilateral upper extremity supported;During functional activity Standing balance-Leahy Scale: Fair Standing balance comment: can stand statically unsupported   ADL Overall ADL's : Needs assistance/impaired Eating/Feeding: Independent;Sitting   Grooming:  Supervision/safety;Standing Grooming Details (indicate cue type and reason): cues for pursed lip breathing and rest breaks when needed Upper Body Bathing: Set up;Sitting   Lower Body Bathing: Supervison/ safety;Set up;Sit to/from stand   Upper Body Dressing : Set up;Sitting   Lower Body Dressing: Supervision/safety;Set up;Sit to/from stand   Toilet Transfer: Supervision/safety;Ambulation;RW   Toileting- Clothing Manipulation and Hygiene: Supervision/safety;Sit to/from stand       Functional mobility during ADLs: Supervision/safety;Rolling walker;Cueing for safety General ADL Comments: Minimal cueing for energy conservation and overall safety. Patient with some unsafe use of RW during functional mobility.            Cognition   Behavior During Therapy: WFL for tasks assessed/performed Overall Cognitive Status: Within Functional Limits for tasks assessed                 Pertinent Vitals/ Pain       Pain Assessment: No/denies pain   Frequency Min 2X/week     Progress Toward Goals  OT Goals(current goals can now befound in the care plan section)  Progress towards OT goals: Progressing toward goals     Plan Discharge plan remains appropriate    End of Session Equipment Utilized During Treatment: Rolling walker;Oxygen   Activity Tolerance Patient tolerated treatment well   Patient Left in chair;with call bell/phone within reach;with chair alarm set    Time: [id number] OT Time Calculation (min): 33 min  Charges: OT General Charges $OT Visit: 1 Procedure OT Treatments $Self Care/Home Management : 23-37 mins  [name] , MS, OTR/L, CLT Pager: [phone]  01/26/2015, 3:55 PM

## 2015-01-26 NOTE — Progress Notes (Signed)
PATIENT DETAILS Name: Priscilla Cantrell Age: 79 y.o. Sex: female Date of Birth: 28-Nov-1935 Admit Date: 01/24/2015 Admitting Physician Waldemar Dickens, MD [id number] H, MD  Subjective:  Patient sitting in bed, denies any chest pain, shortness of breath much improved no wheezing today, no abdominal pain. No headache fever or focal weakness.   Assessment/Plan:  Acute on chronic hypoxic respiratory failure due to COPD exacerbation: Secondary to COPD exacerbation, required BiPAP on admission-subsequently transitioned to eventually mask and then to nasal cannula. On home O2 at night,  Significantly improved, moving air well-some scattered coarse rhonchi, continue steroid taper, continue empiric bronchodilators and levofloxacin. Follow clinical course  Possible pneumonia: Continue levofloxacin. Await blood cultures  Hypothyroidism: Continue Synthroid  Depression with anxiety: Mildly anxious and given COPD exacerbation-but otherwise stable-continue with Valium and Effexor.  GERD: Continue PPI  Hypertension: Controlled with Norvasc  Hypokalemia: Replete and recheck  Musculoskeletal pain: Chronic issue-appears stable    Disposition: Likely discharge in the morning  Antimicrobial agents  See below  Anti-infectives    Start     Dose/Rate Route Frequency Ordered Stop   01/25/15 1300  levofloxacin (LEVAQUIN) IVPB 750 mg     750 mg 100 mL/hr over 90 Minutes Intravenous Every 24 hours 01/24/15 1506 01/30/15 1259   01/24/15 1245  levofloxacin (LEVAQUIN) IVPB 750 mg     750 mg 100 mL/hr over 90 Minutes Intravenous  Once 01/24/15 1240 01/24/15 1535      DVT Prophylaxis: Prophylactic Lovenox  Code Status: Full code   Family Communication None at bedside  Procedures: None  CONSULTS:  None  Time spent 30 minutes-Greater than 50% of this time was spent in counseling, explanation of diagnosis, planning of further management, and coordination of  care.  MEDICATIONS: Scheduled Meds: . amLODipine  5 mg Oral Daily  . aspirin  325 mg Oral Daily  . budesonide (PULMICORT) nebulizer solution  0.25 mg Nebulization BID  . cycloSPORINE  1 drop Both Eyes BID  . enoxaparin (LOVENOX) injection  40 mg Subcutaneous Q24H  . feeding supplement (ENSURE ENLIVE)  237 mL Oral BID BM  . fluticasone  2 spray Each Nare Daily  . guaiFENesin  1,200 mg Oral BID  . ipratropium-albuterol  3 mL Nebulization Q6H  . levofloxacin (LEVAQUIN) IV  750 mg Intravenous Q24H  . levothyroxine  88 mcg Oral QAC breakfast  . loratadine  10 mg Oral Daily  . methylPREDNISolone (SOLU-MEDROL) injection  60 mg Intravenous Q12H  . pantoprazole  40 mg Oral Daily  . roflumilast  500 mcg Oral Daily  . venlafaxine  50 mg Oral BID   Continuous Infusions:   PRN Meds:.acetaminophen **OR** [DISCONTINUED] acetaminophen, albuterol, diazepam, methocarbamol, [DISCONTINUED] ondansetron **OR** ondansetron (ZOFRAN) IV, sodium chloride    PHYSICAL EXAM: Vital signs in last 24 hours: Filed Vitals:   01/26/15 0355 01/26/15 0744 01/26/15 0819 01/26/15 0954  BP:   126/65 133/60  Pulse:   92 89  Temp:   97.8 F (36.6 C) 97.9 F (36.6 C)  TempSrc:   Oral Oral  Resp: 22  22 20   Height:      Weight:      SpO2:  95% 95% 91%    Weight change:  Filed Weights   01/24/15 1656  Weight: 67.1 kg (147 lb 14.9 oz)   Body mass index is 27.5 kg/(m^2).   Gen Exam: Awake and alert with clear speech.  mildly  tremulous  Neck: Supple, No JVD.   Chest:Good air entry bilaterally-some scattered rhonchi.  CVS: S1 S2 Regular, no murmurs.  Abdomen: soft, BS +, non tender, non distended.  Extremities: no edema, lower extremities warm to touch. Neurologic: Non Focal.   Skin: No Rash.   Wounds: N/A.   Intake/Output from previous day:  Intake/Output Summary (Last 24 hours) at 01/26/15 1143 Last data filed at 01/26/15 [id number]  Gross per 24 hour  Intake    483 ml  Output   1850 ml  Net  -1367 ml      LAB RESULTS: CBC  Recent Labs Lab 01/24/15 1217 01/25/15 0309 01/26/15 0348  WBC 16.6* 14.2* 18.9*  HGB 12.7 11.4* 11.4*  HCT 39.9 35.5* 35.2*  PLT 251 239 290  MCV 83.3 83.3 83.2  MCH 26.5 26.8 27.0  MCHC 31.8 32.1 32.4  RDW 15.3 15.2 15.2    Chemistries   Recent Labs Lab 01/24/15 1217 01/25/15 0309 01/26/15 0348  NA 137 136 137  K 4.0 3.3* 4.2  CL 101 102 103  CO2 24 22 23   GLUCOSE 119* 190* 135*  BUN 13 10 21*  CREATININE 0.79 0.65 0.81  CALCIUM 9.3 9.0 9.6    CBG: No results for input(s): GLUCAP in the last 168 hours.  GFR Estimated Creatinine Clearance: 51.6 mL/min (by C-G formula based on Cr of 0.81).  Coagulation profile No results for input(s): INR, PROTIME in the last 168 hours.  Cardiac Enzymes No results for input(s): CKMB, TROPONINI, MYOGLOBIN in the last 168 hours.  Invalid input(s): CK  Invalid input(s): POCBNP No results for input(s): DDIMER in the last 72 hours. No results for input(s): HGBA1C in the last 72 hours. No results for input(s): CHOL, HDL, LDLCALC, TRIG, CHOLHDL, LDLDIRECT in the last 72 hours. No results for input(s): TSH, T4TOTAL, T3FREE, THYROIDAB in the last 72 hours.  Invalid input(s): FREET3 No results for input(s): VITAMINB12, FOLATE, FERRITIN, TIBC, IRON, RETICCTPCT in the last 72 hours. No results for input(s): LIPASE, AMYLASE in the last 72 hours.  Urine Studies No results for input(s): UHGB, CRYS in the last 72 hours.  Invalid input(s): UACOL, UAPR, USPG, UPH, UTP, UGL, UKET, UBIL, UNIT, UROB, ULEU, UEPI, UWBC, URBC, UBAC, CAST, UCOM, BILUA  MICROBIOLOGY: Recent Results (from the past 240 hour(s))  Culture, blood (Routine X 2) w Reflex to ID Panel     Status: None (Preliminary result)   Collection Time: 01/24/15 12:17 PM  Result Value Ref Range Status   Specimen Description BLOOD LEFT ANTECUBITAL  Final   Special Requests BOTTLES DRAWN AEROBIC AND ANAEROBIC 5CC  Final   Culture NO GROWTH 2 DAYS  Final    Report Status PENDING  Incomplete  Culture, blood (Routine X 2) w Reflex to ID Panel     Status: None (Preliminary result)   Collection Time: 01/24/15  1:12 PM  Result Value Ref Range Status   Specimen Description BLOOD RIGHT ANTECUBITAL  Final   Special Requests BOTTLES DRAWN AEROBIC AND ANAEROBIC 5CC  Final   Culture NO GROWTH 2 DAYS  Final   Report Status PENDING  Incomplete  MRSA PCR Screening     Status: None   Collection Time: 01/24/15  5:07 PM  Result Value Ref Range Status   MRSA by PCR NEGATIVE NEGATIVE Final    Comment:        The GeneXpert MRSA Assay (FDA approved for NASAL specimens only), is one component of a comprehensive MRSA colonization surveillance program. It  is not intended to diagnose MRSA infection nor to guide or monitor treatment for MRSA infections.   Culture, sputum-assessment     Status: None   Collection Time: 01/25/15  8:24 PM  Result Value Ref Range Status   Specimen Description SPUTUM  Final   Special Requests NONE  Final   Sputum evaluation   Final    THIS SPECIMEN IS ACCEPTABLE. RESPIRATORY CULTURE REPORT TO FOLLOW.   Report Status 01/25/2015 FINAL  Final  Culture, respiratory (NON-Expectorated)     Status: None (Preliminary result)   Collection Time: 01/25/15  8:24 PM  Result Value Ref Range Status   Specimen Description SPUTUM  Final   Special Requests NONE  Final   Gram Stain   Final    ABUNDANT WBC PRESENT, PREDOMINANTLY PMN FEW SQUAMOUS EPITHELIAL CELLS PRESENT MODERATE GRAM POSITIVE COCCI IN PAIRS IN CHAINS IN CLUSTERS RARE YEAST Performed at [facility]    Culture   Final    NO GROWTH 1 DAY Performed at [facility]    Report Status PENDING  Incomplete    RADIOLOGY STUDIES/RESULTS: US Venous Img Lower Unilateral Right  01/10/2015  CLINICAL DATA:  Right lower extremity pain and swelling for 1 week. EXAM: Right LOWER EXTREMITY VENOUS DOPPLER ULTRASOUND TECHNIQUE: Gray-scale sonography with graded  compression, as well as color Doppler and duplex ultrasound were performed to evaluate the lower extremity deep venous systems from the level of the common femoral vein and including the common femoral, femoral, profunda femoral, popliteal and calf veins including the posterior tibial, peroneal and gastrocnemius veins when visible. The superficial great saphenous vein was also interrogated. Spectral Doppler was utilized to evaluate flow at rest and with distal augmentation maneuvers in the common femoral, femoral and popliteal veins. COMPARISON:  None. FINDINGS: Contralateral Common Femoral Vein: Respiratory phasicity is normal and symmetric with the symptomatic side. No evidence of thrombus. Normal compressibility. Common Femoral Vein: No evidence of thrombus. Normal compressibility, respiratory phasicity and response to augmentation. Saphenofemoral Junction: No evidence of thrombus. Normal compressibility and flow on color Doppler imaging. Profunda Femoral Vein: No evidence of thrombus. Normal compressibility and flow on color Doppler imaging. Femoral Vein: No evidence of thrombus. Normal compressibility, respiratory phasicity and response to augmentation. Popliteal Vein: No evidence of thrombus. Normal compressibility, respiratory phasicity and response to augmentation. Calf Veins: No evidence of thrombus. Normal compressibility and flow on color Doppler imaging. Superficial Great Saphenous Vein: No evidence of thrombus. Normal compressibility and flow on color Doppler imaging. Venous Reflux:  None. Other Findings:  None. IMPRESSION: No evidence of deep venous thrombosis seen in right lower extremity. Electronically Signed   By: Marijo Conception, M.D.   On: 01/10/2015 08:04   Dg Chest Port 1 View  01/26/2015  CLINICAL DATA:  Shortness of Breath EXAM: PORTABLE CHEST 1 VIEW COMPARISON:  January 24, 2015 FINDINGS: There is slight atelectasis in the right base. Lungs elsewhere clear. Heart is upper normal in size  with pulmonary vascularity within normal limits. There is atherosclerotic calcification in the aorta. No adenopathy. No bone lesions. IMPRESSION: Slight right base atelectasis. No edema or consolidation. No change in cardiac silhouette. Electronically Signed   By: Lowella Grip III M.D.   On: 01/26/2015 07:56   Dg Chest Portable 1 View  01/24/2015  CLINICAL DATA:  Productive cough, shortness of breath, nausea, low-grade fever. EXAM: PORTABLE CHEST 1 VIEW COMPARISON:  11/16/2014 FINDINGS: Increasing opacity at the right lung base concerning for possible pneumonia. Left lung is  clear. Underlying hyperinflation/COPD. Heart is normal size. No effusions or acute bony abnormality. IMPRESSION: COPD/hyperinflation. Increasing right basilar opacity concerning for pneumonia. Electronically Signed   By: Rolm Baptise M.D.   On: 01/24/2015 12:33    Thurnell Lose, MD  [facility] Pager:[phone]  If 7PM-7AM, please contact night-coverage [email or web address] Password [facility] 01/26/2015, 11:43 AM   LOS: 2 days

## 2015-01-26 NOTE — Progress Notes (Signed)
Patient transferring to 5W. Report called to receiving nurse. [name] daughter notified of transfer.

## 2015-01-27 DIAGNOSIS — R0602 Shortness of breath: Secondary | ICD-10-CM

## 2015-01-27 DIAGNOSIS — I10 Essential (primary) hypertension: Secondary | ICD-10-CM

## 2015-01-27 DIAGNOSIS — Z96641 Presence of right artificial hip joint: Secondary | ICD-10-CM

## 2015-01-27 DIAGNOSIS — J441 Chronic obstructive pulmonary disease with (acute) exacerbation: Principal | ICD-10-CM

## 2015-01-27 MED ORDER — PREDNISONE 5 MG PO TABS: ORAL_TABLET | ORAL | Status: DC

## 2015-01-27 MED ORDER — LEVOFLOXACIN 500 MG PO TABS: 500.0000 mg | ORAL_TABLET | Freq: Every day | ORAL | Status: DC

## 2015-01-27 NOTE — Progress Notes (Signed)
Nsg Discharge Note  Admit Date:  01/24/2015 Discharge date: 01/27/2015   VICTORY ALVES to be D/C'd Home with Home health/ O2 needs per MD order.  AVS completed.  Copy for chart, and copy for patient signed, and dated. Patient/caregiver able to verbalize understanding.  Discharge Medication:   Medication List    STOP taking these medications        doxycycline 100 MG tablet  Commonly known as:  VIBRA-TABS     HYDROcodone-acetaminophen 5-325 MG tablet  Commonly known as:  NORCO/VICODIN      TAKE these medications        ADVAIR DISKUS 250-50 MCG/DOSE Aepb  Generic drug:  Fluticasone-Salmeterol  INHALE 1 PUFF INTO THE LUNGS TWICE DAILY     albuterol 108 (90 BASE) MCG/ACT inhaler  Commonly known as:  PROAIR HFA  Inhale 2 puffs into the lungs every 4 (four) hours as needed.     amLODipine 5 MG tablet  Commonly known as:  NORVASC  Take 1 tablet (5 mg total) by mouth daily.     aspirin 325 MG tablet  Take 325 mg by mouth daily.     cetirizine 10 MG tablet  Commonly known as:  ZYRTEC  Take 1 tablet (10 mg total) by mouth daily.     diazepam 5 MG tablet  Commonly known as:  VALIUM  Take 1 tablet (5 mg total) by mouth every 12 (twelve) hours as needed.     ferrous fumarate 325 (106 FE) MG Tabs tablet  Commonly known as:  HEMOCYTE - 106 mg FE  Take 1 tablet by mouth daily.     levofloxacin 500 MG tablet  Commonly known as:  LEVAQUIN  Take 1 tablet (500 mg total) by mouth daily.     methocarbamol 500 MG tablet  Commonly known as:  ROBAXIN  Take 1 tablet (500 mg total) by mouth every 6 (six) hours as needed for muscle spasms.     nitrofurantoin (macrocrystal-monohydrate) 100 MG capsule  Commonly known as:  MACROBID  Take 1 capsule (100 mg total) by mouth 2 (two) times daily.     omeprazole 20 MG capsule  Commonly known as:  PRILOSEC  Take 20 mg by mouth daily.     predniSONE 5 MG tablet  Commonly known as:  DELTASONE  Label  & dispense according to the  schedule below. 10 Pills PO for 3 days then, 8 Pills PO for 3 days, 6 Pills PO for 3 days, 4 Pills PO for 3 days, 2 Pills PO for 3 days, 1 Pills PO for 3 days, 1/2 Pill  PO for 3 days then STOP. Total 95 pills.     RESTASIS 0.05 % ophthalmic emulsion  Generic drug:  cycloSPORINE  Apply 1 drop to eye 2 (two) times daily.     roflumilast 500 MCG Tabs tablet  Commonly known as:  DALIRESP  Take 1 tablet by mouth  daily     SPIRIVA HANDIHALER 18 MCG inhalation capsule  Generic drug:  tiotropium  INHALE 1 CAPSULE VIA HANDIHALER EVERY DAY AT THE SAME TIME     SYNTHROID 88 MCG tablet  Generic drug:  levothyroxine  TAKE 1 TABLET BY MOUTH DAILY     venlafaxine 50 MG tablet  Commonly known as:  EFFEXOR  Take 1 tablet (50 mg total) by mouth 2 (two) times daily.        Discharge Assessment: Filed Vitals:   01/26/15 2127 01/27/15 0514  BP: 142/75 136/65  Pulse:  82 78  Temp: 98.1 F (36.7 C) 98.6 F (37 C)  Resp: 18 20   Skin clean, dry and intact without evidence of skin break down, no evidence of skin tears noted. IV catheter discontinued intact. Site without signs and symptoms of complications - no redness or edema noted at insertion site, patient denies c/o pain - only slight tenderness at site.  Dressing with slight pressure applied.  D/c Instructions-Education: Discharge instructions given to patient/family with verbalized understanding. D/c education completed with patient/family including follow up instructions, medication list, d/c activities limitations if indicated, with other d/c instructions as indicated by MD - patient able to verbalize understanding, all questions fully answered. Patient instructed to return to ED, call 911, or call MD for any changes in condition.  Patient escorted via [place], and D/C home via private auto.  Dayle Points, RN 01/27/2015 12:59 PM

## 2015-01-27 NOTE — Discharge Summary (Signed)
Priscilla Cantrell, is a 79 y.o. female  DOB 1936-01-11  MRN [id number].  Admission date:  01/24/2015  Admitting Physician  Waldemar Dickens, MD  Discharge Date:  01/27/2015   Primary MD  [name], MD  Recommendations for primary care physician for things to follow:   Close outpatient pulmonary follow-up, repeat CBC, BMP two-view chest x-ray in a week.   Admission Diagnosis  cough;hard time breathing    Discharge Diagnosis  cough;hard time breathing      Principal Problem:   Acute respiratory failure (HCC) Active Problems:   Depression with anxiety   COPD exacerbation (HCC)   Musculoskeletal pain   History of right hip replacement   Essential hypertension   SOB (shortness of breath)      Past Medical History  Diagnosis Date  . Hypertension     EKG 03/2010 normal  . COPD (chronic obstructive pulmonary disease) ([place])     spirometry 01/10/09 FEV 0.97(52%), FEV1% 47  . Hypoxemia     O2 86% RA with exertion 01/10/09, 2 liters oxygen with exertion and sleep  . Pulmonary nodule     CT chest 12/10, June 2011.  No nodule on CT 06/2010.  Marland Kitchen Spinal stenosis     Symptomatic (neurogenic claudication)  . GERD (gastroesophageal reflux disease)   . Hypothyroidism     Hashimoto's, dx'd 1989  . Diverticulosis of colon     Procto '97 and colonoscopy 2002  . DDD (degenerative disc disease), cervical     1998 MRI C5-6 impingemt (left)  . DDD (degenerative disc disease), lumbar   . Spinal stenosis of lumbar region with neurogenic claudication     2008 MRI (L3-4, L4-5, L5-S1)  . Fibrocystic breast disease     w/fibroadenoma.  Bx's showed NO atypia (Dr. Margot Chimes).  Mammo neg 2008.  Marland Kitchen Chest pain, non-cardiac     Cardiac CT showed no signif obstructive dz, mildly elevated calcium level (Dr. [name]).  . History of  double vision     Ophthalmologist, Dr. Herbert Deaner, is further evaluating this with MRI  orbits and limited brain (myesthenia gravis testing neg 07/2010)  . Depression with anxiety 04/15/2011  . Asthma   . Dysplastic nevus of upper extremity 04/2014    R tricep ([name])  . Osteoarthritis, hip, bilateral     End stage; R THA  . History of home oxygen therapy     uses 2 liters at hs  . Family history of adverse reaction to anesthesia     mother died when patient age 54 receiving anesthesia for thyroid surgery    Past Surgical History  Procedure Laterality Date  . Cholecystectomy  2001  . Tubal ligation  1974  . Abdominal hysterectomy  1978    no BSO per pt--nonmalignant reasons  . Breast cyst excision      Last screening mammogram 10/2010 was normal.  . Appendectomy    . Tonsillectomy    . Colonoscopy  04/2005    NORMAL-recall 10 yrs  . Transthoracic echocardiogram  08/2014    EF 60-65%, grade I diast dysfxn  . Total hip arthroplasty Right 11/01/2014    Procedure: RIGHT TOTAL HIP ARTHROPLASTY;  Surgeon: Latanya Maudlin, MD;  Location: [facility];  Service: Orthopedics;  Laterality: Right;       HPI  from the history and physical done on the day of admission:    Priscilla Cantrell is a 79 y.o. female presented with Cough and SOB. Started 4 days ago. Associated w/ wheezing and yellow greenish productive sputum. Intermittent improvement with breathing treatments. Getting worse. On 2 L nasal cannula at home. Right lower extremity swelling at baseline. Symptoms are worse with ambulation. Denies any chest pain, orthopnea, palpitations, syncope, fevers.    Hospital Course:     Acute on chronic hypoxic respiratory failure due to COPD exacerbation: Secondary to COPD exacerbation, required BiPAP on admission-subsequently transitioned to eventually mask and then to nasal cannula. On home O2 at night, Significantly improved, moving air minimal bilateral wheezing, continue steroid taper orally in the  outpatient setting, continue empiric bronchodilators and levofloxacin orally for 4 more days. He will qualify for daytime oxygen and she already was on nighttime oxygen, home PT and RN also ordered, requested to follow with PCP and pulmonary within a week.  CAP: Continue levofloxacin for 4 more days, cultures negative, afebrile and symptom free now.  Hypothyroidism: Continue Synthroid  Depression with anxiety: Mildly anxious and given COPD exacerbation-but otherwise stable-continue with Valium and Effexor.  GERD: Continue PPI  Hypertension: Controlled with Norvasc  Hypokalemia: Repleted and Stable.  Musculoskeletal pain: Chronic issue-appears stable   Discharge Condition: fair  Follow UP  Follow-up Information    Follow up with MCGOWEN,PHILIP H, MD. Schedule an appointment as soon as possible for a visit in 1 week.   Specialty:  Family Medicine   Contact information:   [address] [place] [address] [phone]       Follow up with SOOD,[name], MD. Schedule an appointment as soon as possible for a visit in 1 week.   Specialty:  Pulmonary Disease   Contact information:   [address] [address] [phone]        Consults obtained - None  Diet and Activity recommendation: See Discharge Instructions below  Discharge Instructions       Discharge Instructions    Diet - low sodium heart healthy    Complete by:  As directed      Discharge instructions    Complete by:  As directed   Follow with Primary MD MCGOWEN,PHILIP H, MD in 7 days   Get CBC, CMP, 2 view Chest X ray checked  by Primary MD next visit.    Activity: As tolerated with Full fall precautions use walker/cane & assistance as needed   Disposition Home     Diet:   Heart Healthy    For Heart failure patients - Check your Weight same time everyday, if you gain over 2 pounds, or you develop in leg swelling, experience more shortness of breath or chest pain, call your Primary  MD immediately. Follow Cardiac Low Salt Diet and 1.5 lit/day fluid restriction.   On your next visit with your primary care physician please Get Medicines reviewed and adjusted.   Please request your Prim.MD to go over all Hospital Tests and Procedure/Radiological results at the follow up, please get all Hospital records sent to your Prim MD by signing hospital release before you go home.   If you experience  worsening of your admission symptoms, develop shortness of breath, life threatening emergency, suicidal or homicidal thoughts you must seek medical attention immediately by calling 911 or calling your MD immediately  if symptoms less severe.  You Must read complete instructions/literature along with all the possible adverse reactions/side effects for all the Medicines you take and that have been prescribed to you. Take any new Medicines after you have completely understood and accpet all the possible adverse reactions/side effects.   Do not drive, operating heavy machinery, perform activities at heights, swimming or participation in water activities or provide baby sitting services if your were admitted for syncope or siezures until you have seen by Primary MD or a Neurologist and advised to do so again.  Do not drive when taking Pain medications.    Do not take more than prescribed Pain, Sleep and Anxiety Medications  Special Instructions: If you have smoked or chewed Tobacco  in the last 2 yrs please stop smoking, stop any regular Alcohol  and or any Recreational drug use.  Wear Seat belts while driving.   Please note  You were cared for by a hospitalist during your hospital stay. If you have any questions about your discharge medications or the care you received while you were in the hospital after you are discharged, you can call the unit and asked to speak with the hospitalist on call if the hospitalist that took care of you is not available. Once you are discharged, your primary  care physician will handle any further medical issues. Please note that NO REFILLS for any discharge medications will be authorized once you are discharged, as it is imperative that you return to your primary care physician (or establish a relationship with a primary care physician if you do not have one) for your aftercare needs so that they can reassess your need for medications and monitor your lab values.     Increase activity slowly    Complete by:  As directed              Discharge Medications       Medication List    STOP taking these medications        doxycycline 100 MG tablet  Commonly known as:  VIBRA-TABS     HYDROcodone-acetaminophen 5-325 MG tablet  Commonly known as:  NORCO/VICODIN      TAKE these medications        ADVAIR DISKUS 250-50 MCG/DOSE Aepb  Generic drug:  Fluticasone-Salmeterol  INHALE 1 PUFF INTO THE LUNGS TWICE DAILY     albuterol 108 (90 BASE) MCG/ACT inhaler  Commonly known as:  PROAIR HFA  Inhale 2 puffs into the lungs every 4 (four) hours as needed.     amLODipine 5 MG tablet  Commonly known as:  NORVASC  Take 1 tablet (5 mg total) by mouth daily.     aspirin 325 MG tablet  Take 325 mg by mouth daily.     cetirizine 10 MG tablet  Commonly known as:  ZYRTEC  Take 1 tablet (10 mg total) by mouth daily.     diazepam 5 MG tablet  Commonly known as:  VALIUM  Take 1 tablet (5 mg total) by mouth every 12 (twelve) hours as needed.     ferrous fumarate 325 (106 FE) MG Tabs tablet  Commonly known as:  HEMOCYTE - 106 mg FE  Take 1 tablet by mouth daily.     levofloxacin 500 MG tablet  Commonly known as:  LEVAQUIN  Take 1 tablet (500 mg total) by mouth daily.     methocarbamol 500 MG tablet  Commonly known as:  ROBAXIN  Take 1 tablet (500 mg total) by mouth every 6 (six) hours as needed for muscle spasms.     nitrofurantoin (macrocrystal-monohydrate) 100 MG capsule  Commonly known as:  MACROBID  Take 1 capsule (100 mg total) by mouth  2 (two) times daily.     omeprazole 20 MG capsule  Commonly known as:  PRILOSEC  Take 20 mg by mouth daily.     predniSONE 5 MG tablet  Commonly known as:  DELTASONE  Label  & dispense according to the schedule below. 10 Pills PO for 3 days then, 8 Pills PO for 3 days, 6 Pills PO for 3 days, 4 Pills PO for 3 days, 2 Pills PO for 3 days, 1 Pills PO for 3 days, 1/2 Pill  PO for 3 days then STOP. Total 95 pills.     RESTASIS 0.05 % ophthalmic emulsion  Generic drug:  cycloSPORINE  Apply 1 drop to eye 2 (two) times daily.     roflumilast 500 MCG Tabs tablet  Commonly known as:  DALIRESP  Take 1 tablet by mouth  daily     SPIRIVA HANDIHALER 18 MCG inhalation capsule  Generic drug:  tiotropium  INHALE 1 CAPSULE VIA HANDIHALER EVERY DAY AT THE SAME TIME     SYNTHROID 88 MCG tablet  Generic drug:  levothyroxine  TAKE 1 TABLET BY MOUTH DAILY     venlafaxine 50 MG tablet  Commonly known as:  EFFEXOR  Take 1 tablet (50 mg total) by mouth 2 (two) times daily.        Major procedures and Radiology Reports - PLEASE review detailed and final reports for all details, in brief -       US Venous Img Lower Unilateral Right  01/10/2015  CLINICAL DATA:  Right lower extremity pain and swelling for 1 week. EXAM: Right LOWER EXTREMITY VENOUS DOPPLER ULTRASOUND TECHNIQUE: Gray-scale sonography with graded compression, as well as color Doppler and duplex ultrasound were performed to evaluate the lower extremity deep venous systems from the level of the common femoral vein and including the common femoral, femoral, profunda femoral, popliteal and calf veins including the posterior tibial, peroneal and gastrocnemius veins when visible. The superficial great saphenous vein was also interrogated. Spectral Doppler was utilized to evaluate flow at rest and with distal augmentation maneuvers in the common femoral, femoral and popliteal veins. COMPARISON:  None. FINDINGS: Contralateral Common Femoral Vein:  Respiratory phasicity is normal and symmetric with the symptomatic side. No evidence of thrombus. Normal compressibility. Common Femoral Vein: No evidence of thrombus. Normal compressibility, respiratory phasicity and response to augmentation. Saphenofemoral Junction: No evidence of thrombus. Normal compressibility and flow on color Doppler imaging. Profunda Femoral Vein: No evidence of thrombus. Normal compressibility and flow on color Doppler imaging. Femoral Vein: No evidence of thrombus. Normal compressibility, respiratory phasicity and response to augmentation. Popliteal Vein: No evidence of thrombus. Normal compressibility, respiratory phasicity and response to augmentation. Calf Veins: No evidence of thrombus. Normal compressibility and flow on color Doppler imaging. Superficial Great Saphenous Vein: No evidence of thrombus. Normal compressibility and flow on color Doppler imaging. Venous Reflux:  None. Other Findings:  None. IMPRESSION: No evidence of deep venous thrombosis seen in right lower extremity. Electronically Signed   By: Marijo Conception, M.D.   On: 01/10/2015 08:04   Dg Chest Port 1 View  01/26/2015  CLINICAL DATA:  Shortness of Breath EXAM: PORTABLE CHEST 1 VIEW COMPARISON:  January 24, 2015 FINDINGS: There is slight atelectasis in the right base. Lungs elsewhere clear. Heart is upper normal in size with pulmonary vascularity within normal limits. There is atherosclerotic calcification in the aorta. No adenopathy. No bone lesions. IMPRESSION: Slight right base atelectasis. No edema or consolidation. No change in cardiac silhouette. Electronically Signed   By: Lowella Grip III M.D.   On: 01/26/2015 07:56   Dg Chest Portable 1 View  01/24/2015  CLINICAL DATA:  Productive cough, shortness of breath, nausea, low-grade fever. EXAM: PORTABLE CHEST 1 VIEW COMPARISON:  11/16/2014 FINDINGS: Increasing opacity at the right lung base concerning for possible pneumonia. Left lung is clear.  Underlying hyperinflation/COPD. Heart is normal size. No effusions or acute bony abnormality. IMPRESSION: COPD/hyperinflation. Increasing right basilar opacity concerning for pneumonia. Electronically Signed   By: Rolm Baptise M.D.   On: 01/24/2015 12:33    Micro Results      Recent Results (from the past 240 hour(s))  Culture, blood (Routine X 2) w Reflex to ID Panel     Status: None (Preliminary result)   Collection Time: 01/24/15 12:17 PM  Result Value Ref Range Status   Specimen Description BLOOD LEFT ANTECUBITAL  Final   Special Requests BOTTLES DRAWN AEROBIC AND ANAEROBIC 5CC  Final   Culture NO GROWTH 2 DAYS  Final   Report Status PENDING  Incomplete  Culture, blood (Routine X 2) w Reflex to ID Panel     Status: None (Preliminary result)   Collection Time: 01/24/15  1:12 PM  Result Value Ref Range Status   Specimen Description BLOOD RIGHT ANTECUBITAL  Final   Special Requests BOTTLES DRAWN AEROBIC AND ANAEROBIC 5CC  Final   Culture NO GROWTH 2 DAYS  Final   Report Status PENDING  Incomplete  MRSA PCR Screening     Status: None   Collection Time: 01/24/15  5:07 PM  Result Value Ref Range Status   MRSA by PCR NEGATIVE NEGATIVE Final    Comment:        The GeneXpert MRSA Assay (FDA approved for NASAL specimens only), is one component of a comprehensive MRSA colonization surveillance program. It is not intended to diagnose MRSA infection nor to guide or monitor treatment for MRSA infections.   Culture, sputum-assessment     Status: None   Collection Time: 01/25/15  8:24 PM  Result Value Ref Range Status   Specimen Description SPUTUM  Final   Special Requests NONE  Final   Sputum evaluation   Final    THIS SPECIMEN IS ACCEPTABLE. RESPIRATORY CULTURE REPORT TO FOLLOW.   Report Status 01/25/2015 FINAL  Final  Culture, respiratory (NON-Expectorated)     Status: None (Preliminary result)   Collection Time: 01/25/15  8:24 PM  Result Value Ref Range Status   Specimen  Description SPUTUM  Final   Special Requests NONE  Final   Gram Stain   Final    ABUNDANT WBC PRESENT, PREDOMINANTLY PMN FEW SQUAMOUS EPITHELIAL CELLS PRESENT MODERATE GRAM POSITIVE COCCI IN PAIRS IN CHAINS IN CLUSTERS RARE YEAST Performed at [facility]    Culture   Final    NO GROWTH 1 DAY Performed at [facility]    Report Status PENDING  Incomplete    Today   Subjective    Priscilla Cantrell today has no headache,no chest abdominal pain,no new weakness tingling or numbness, feels much better wants to go home today.  Objective   Blood pressure 136/65, pulse 78, temperature 98.6 F (37 C), temperature source Oral, resp. rate 20, height 5' 1.5" (1.562 m), weight 67.1 kg (147 lb 14.9 oz), SpO2 95 %.   Intake/Output Summary (Last 24 hours) at 01/27/15 0954 Last data filed at 01/27/15 0914  Gross per 24 hour  Intake    590 ml  Output    800 ml  Net   -210 ml    Exam Awake Alert, Oriented x 3, No new F.N deficits, Normal affect [place].AT,PERRAL Supple Neck,No JVD, No cervical lymphadenopathy appriciated.  Symmetrical Chest wall movement, Good air movement bilaterally, few wheezes RRR,No Gallops,Rubs or new Murmurs, No Parasternal Heave +ve B.Sounds, Abd Soft, Non tender, No organomegaly appriciated, No rebound -guarding or rigidity. No Cyanosis, Clubbing or edema, No new Rash or bruise   Data Review   CBC w Diff: Lab Results  Component Value Date   WBC 18.9* 01/26/2015   HGB 11.4* 01/26/2015   HCT 35.2* 01/26/2015   PLT 290 01/26/2015   LYMPHOPCT 22.0 12/06/2014   MONOPCT 7.9 12/06/2014   EOSPCT 2.8 12/06/2014   BASOPCT 0.5 12/06/2014    CMP: Lab Results  Component Value Date   NA 137 01/26/2015   K 4.2 01/26/2015   CL 103 01/26/2015   CO2 23 01/26/2015   BUN 21* 01/26/2015   CREATININE 0.81 01/26/2015   CREATININE 0.68 06/02/2014   PROT 6.6 01/25/2015   ALBUMIN 2.9* 01/25/2015   BILITOT 0.4 01/25/2015   ALKPHOS 63 01/25/2015   AST 40  01/25/2015   ALT 24 01/25/2015  .   Total Time in preparing paper work, data evaluation and todays exam - 35 minutes  Thurnell Lose M.D on 01/27/2015 at 9:54 AM  [facility]   Office  [phone]

## 2015-01-27 NOTE — Discharge Instructions (Signed)
Follow with Primary MD MCGOWEN,PHILIP H, MD in 7 days   Get CBC, CMP, 2 view Chest X ray checked  by Primary MD next visit.    Activity: As tolerated with Full fall precautions use walker/cane & assistance as needed   Disposition Home     Diet:   Heart Healthy    For Heart failure patients - Check your Weight same time everyday, if you gain over 2 pounds, or you develop in leg swelling, experience more shortness of breath or chest pain, call your Primary MD immediately. Follow Cardiac Low Salt Diet and 1.5 lit/day fluid restriction.   On your next visit with your primary care physician please Get Medicines reviewed and adjusted.   Please request your Prim.MD to go over all Hospital Tests and Procedure/Radiological results at the follow up, please get all Hospital records sent to your Prim MD by signing hospital release before you go home.   If you experience worsening of your admission symptoms, develop shortness of breath, life threatening emergency, suicidal or homicidal thoughts you must seek medical attention immediately by calling 911 or calling your MD immediately  if symptoms less severe.  You Must read complete instructions/literature along with all the possible adverse reactions/side effects for all the Medicines you take and that have been prescribed to you. Take any new Medicines after you have completely understood and accpet all the possible adverse reactions/side effects.   Do not drive, operating heavy machinery, perform activities at heights, swimming or participation in water activities or provide baby sitting services if your were admitted for syncope or siezures until you have seen by Primary MD or a Neurologist and advised to do so again.  Do not drive when taking Pain medications.    Do not take more than prescribed Pain, Sleep and Anxiety Medications  Special Instructions: If you have smoked or chewed Tobacco  in the last 2 yrs please stop smoking, stop any  regular Alcohol  and or any Recreational drug use.  Wear Seat belts while driving.   Please note  You were cared for by a hospitalist during your hospital stay. If you have any questions about your discharge medications or the care you received while you were in the hospital after you are discharged, you can call the unit and asked to speak with the hospitalist on call if the hospitalist that took care of you is not available. Once you are discharged, your primary care physician will handle any further medical issues. Please note that NO REFILLS for any discharge medications will be authorized once you are discharged, as it is imperative that you return to your primary care physician (or establish a relationship with a primary care physician if you do not have one) for your aftercare needs so that they can reassess your need for medications and monitor your lab values.

## 2015-01-28 LAB — CULTURE, RESPIRATORY: Culture: NORMAL

## 2015-01-28 LAB — CULTURE, RESPIRATORY W GRAM STAIN

## 2015-01-29 LAB — CULTURE, BLOOD (ROUTINE X 2)
Culture: NO GROWTH
Culture: NO GROWTH

## 2015-01-30 ENCOUNTER — Other Ambulatory Visit: Payer: Self-pay | Admitting: Pulmonary Disease

## 2015-02-02 ENCOUNTER — Ambulatory Visit (INDEPENDENT_AMBULATORY_CARE_PROVIDER_SITE_OTHER): Payer: Medicare Other | Admitting: Family Medicine

## 2015-02-02 ENCOUNTER — Encounter: Payer: Self-pay | Admitting: Family Medicine

## 2015-02-02 VITALS — BP 93/61 | HR 81 | Temp 97.6°F | Resp 16 | Ht 61.5 in | Wt 142.2 lb

## 2015-02-02 DIAGNOSIS — R031 Nonspecific low blood-pressure reading: Secondary | ICD-10-CM

## 2015-02-02 DIAGNOSIS — J441 Chronic obstructive pulmonary disease with (acute) exacerbation: Secondary | ICD-10-CM

## 2015-02-02 DIAGNOSIS — I10 Essential (primary) hypertension: Secondary | ICD-10-CM

## 2015-02-02 DIAGNOSIS — J9611 Chronic respiratory failure with hypoxia: Secondary | ICD-10-CM | POA: Insufficient documentation

## 2015-02-02 DIAGNOSIS — J189 Pneumonia, unspecified organism: Secondary | ICD-10-CM | POA: Diagnosis not present

## 2015-02-02 HISTORY — DX: Chronic respiratory failure with hypoxia: J96.11

## 2015-02-02 NOTE — Progress Notes (Signed)
OFFICE VISIT  02/02/2015   CC:  Chief Complaint  Patient presents with  . Hospitalization Follow-up   HPI:    Patient is a 79 y.o. Caucasian female who presents for hospital f/u for recent COPD exacerbation and R basilar pneumonia/atelectasis. Admitted 12/21-12/24, 2016 with acute resp failure.  Reviewed d/c summary, notes, imaging, labs today. She was d/c'd home on steroid taper and levaquin for 4 additional days.  Went home on 2L oxygen 24/7. Oxygen at home on RA at rest 90%, with exertion goes down to 88%. On 2L oxygen she gets above 94%.    Head feels congested/full, but does not feel like she has chest congestion or tightness currently. She feels like she is gradually improving.  Her pred dose goes down to 30 qd tomorrow. Has only needed albuterol HFA once since d/c.    She says she is eating/drinking. Feels no dizziness upon standing.  +Generalized weakness.  No pain or swelling in LLs/ankles.  No pain anywhere.  Sounds like she had some poor PO intake and loose/frequent BMs prior to getting admitted as well.  The loose BMs have resolved.     Past Medical History  Diagnosis Date  . Hypertension     EKG 03/2010 normal  . COPD (chronic obstructive pulmonary disease) ([place])     spirometry 01/10/09 FEV 0.97(52%), FEV1% 47  . Hypoxemia     O2 86% RA with exertion 01/10/09, 2 liters oxygen with exertion and sleep  . Pulmonary nodule     CT chest 12/10, June 2011.  No nodule on CT 06/2010.  Marland Kitchen Spinal stenosis     Symptomatic (neurogenic claudication)  . GERD (gastroesophageal reflux disease)   . Hypothyroidism     Hashimoto's, dx'd 1989  . Diverticulosis of colon     Procto '97 and colonoscopy 2002  . DDD (degenerative disc disease), cervical     1998 MRI C5-6 impingemt (left)  . DDD (degenerative disc disease), lumbar   . Spinal stenosis of lumbar region with neurogenic claudication     2008 MRI (L3-4, L4-5, L5-S1)  . Fibrocystic breast disease     w/fibroadenoma.  Bx's  showed NO atypia (Dr. Margot Chimes).  Mammo neg 2008.  Marland Kitchen Chest pain, non-cardiac     Cardiac CT showed no signif obstructive dz, mildly elevated calcium level (Dr. [name]).  . History of double vision     Ophthalmologist, Dr. Herbert Deaner, is further evaluating this with MRI  orbits and limited brain (myesthenia gravis testing neg 07/2010)  . Depression with anxiety 04/15/2011  . Asthma   . Dysplastic nevus of upper extremity 04/2014    R tricep ([name])  . Osteoarthritis, hip, bilateral     End stage; R THA  . History of home oxygen therapy     uses 2 liters at hs  . Family history of adverse reaction to anesthesia     mother died when patient age 34 receiving anesthesia for thyroid surgery    Past Surgical History  Procedure Laterality Date  . Cholecystectomy  2001  . Tubal ligation  1974  . Abdominal hysterectomy  1978    no BSO per pt--nonmalignant reasons  . Breast cyst excision      Last screening mammogram 10/2010 was normal.  . Appendectomy    . Tonsillectomy    . Colonoscopy  04/2005    NORMAL-recall 10 yrs  . Transthoracic echocardiogram  08/2014    EF 60-65%, grade I diast dysfxn  . Total hip arthroplasty Right  11/01/2014    Procedure: RIGHT TOTAL HIP ARTHROPLASTY;  Surgeon: Latanya Maudlin, MD;  Location: [facility];  Service: Orthopedics;  Laterality: Right;    Outpatient Prescriptions Prior to Visit  Medication Sig Dispense Refill  . ADVAIR DISKUS 250-50 MCG/DOSE AEPB INHALE 1 PUFF INTO THE LUNGS TWICE DAILY 60 each 0  . albuterol (PROAIR HFA) 108 (90 BASE) MCG/ACT inhaler Inhale 2 puffs into the lungs every 4 (four) hours as needed. (Patient taking differently: Inhale 2 puffs into the lungs every 4 (four) hours as needed for wheezing or shortness of breath. ) 1 Inhaler 5  . amLODipine (NORVASC) 5 MG tablet Take 1 tablet (5 mg total) by mouth daily. 90 tablet 3  . aspirin 325 MG tablet Take 325 mg by mouth daily.    Marland Kitchen DALIRESP 500 MCG TABS tablet TAKE 1 TABLET BY MOUTH DAILY 90  tablet 0  . diazepam (VALIUM) 5 MG tablet Take 1 tablet (5 mg total) by mouth every 12 (twelve) hours as needed. (Patient taking differently: Take 2.5 mg by mouth every 12 (twelve) hours as needed for anxiety. ) 45 tablet 0  . omeprazole (PRILOSEC) 20 MG capsule Take 20 mg by mouth daily.      . predniSONE (DELTASONE) 5 MG tablet Label  & dispense according to the schedule below. 10 Pills PO for 3 days then, 8 Pills PO for 3 days, 6 Pills PO for 3 days, 4 Pills PO for 3 days, 2 Pills PO for 3 days, 1 Pills PO for 3 days, 1/2 Pill  PO for 3 days then STOP. Total 95 pills. 95 tablet 0  . RESTASIS 0.05 % ophthalmic emulsion Apply 1 drop to eye 2 (two) times daily.  4  . SPIRIVA HANDIHALER 18 MCG inhalation capsule INHALE 1 CAPSULE VIA HANDIHALER EVERY DAY AT THE SAME TIME 30 capsule 5  . SYNTHROID 88 MCG tablet TAKE 1 TABLET BY MOUTH DAILY 30 tablet 6  . venlafaxine (EFFEXOR) 50 MG tablet Take 1 tablet (50 mg total) by mouth 2 (two) times daily. (Patient taking differently: Take 50 mg by mouth 2 (two) times daily. Takes 1 tab am, 1/2 tab pm) 60 tablet 3  . cetirizine (ZYRTEC) 10 MG tablet Take 1 tablet (10 mg total) by mouth daily. (Patient not taking: Reported on 02/02/2015) 30 tablet 11  . ferrous fumarate (HEMOCYTE - 106 MG FE) 325 (106 FE) MG TABS tablet Take 1 tablet by mouth daily. Reported on 02/02/2015    . levofloxacin (LEVAQUIN) 500 MG tablet Take 1 tablet (500 mg total) by mouth daily. (Patient not taking: Reported on 02/02/2015) 4 tablet 0  . methocarbamol (ROBAXIN) 500 MG tablet Take 1 tablet (500 mg total) by mouth every 6 (six) hours as needed for muscle spasms. (Patient not taking: Reported on 02/02/2015) 40 tablet 1  . nitrofurantoin, macrocrystal-monohydrate, (MACROBID) 100 MG capsule Take 1 capsule (100 mg total) by mouth 2 (two) times daily. (Patient not taking: Reported on 01/24/2015) 14 capsule 0   No facility-administered medications prior to visit.    Allergies  Allergen  Reactions  . Losartan Other (See Comments)    hyperkalemia  . Penicillins Hives, Swelling and Rash    Has patient had a PCN reaction causing immediate rash, facial/tongue/throat swelling, SOB or lightheadedness with hypotension: YES Has patient had a PCN reaction causing severe rash involving mucus membranes or skin necrosis: NO Has patient had a PCN reaction that required hospitalization NO Has patient had a PCN reaction occurring within the  last 10 years: NO If all of the above answers are "NO", then may proceed with Cephalosporin use.   . Cefixime [Kdc:Cefixime] Other (See Comments)    unspecified  . Indocin [Indomethacin] Other (See Comments)    Painful tongue and throat  . Sulfa Antibiotics Other (See Comments)    unspecified  . Ace Inhibitors Other (See Comments)    cough  . Statins Other (See Comments)    Myalgias     ROS As per HPI  PE: Blood pressure 93/61, pulse 81, temperature 97.6 F (36.4 C), temperature source Oral, resp. rate 16, height 5' 1.5" (1.562 m), weight 142 lb 4 oz (64.524 kg), SpO2 98 %. On oxygen 2L [place] Gen: Alert, well appearing.  Patient is oriented to person, place, time, and situation. CV: RRR, no m/r/g.   LUNGS: CTA bilat, nonlabored resps, good aeration in all lung fields. EXT: no clubbing, cyanosis, or edema.   LABS:    Chemistry      Component Value Date/Time   NA 137 01/26/2015 0348   K 4.2 01/26/2015 0348   CL 103 01/26/2015 0348   CO2 23 01/26/2015 0348   BUN 21* 01/26/2015 0348   CREATININE 0.81 01/26/2015 0348   CREATININE 0.68 06/02/2014 1518      Component Value Date/Time   CALCIUM 9.6 01/26/2015 0348   ALKPHOS 63 01/25/2015 0309   AST 40 01/25/2015 0309   ALT 24 01/25/2015 0309   BILITOT 0.4 01/25/2015 0309     Lab Results  Component Value Date   WBC 18.9* 01/26/2015   HGB 11.4* 01/26/2015   HCT 35.2* 01/26/2015   MCV 83.2 01/26/2015   PLT 290 01/26/2015   IMPRESSION AND PLAN:  1) COPD exacerbation with possible  CAP (see admission CXR interpretation). 6 day hospital f/u: gradually improving, finished with antibiotics. Continue prednisone taper, supplemental oxygen, spireva, daliresp, advair, and prn albuterol HFA. She has f/u with Dr. Juanetta Gosling PA on 02/08/15. No imaging or labs indicated today.  2) Low blood pressure reading---asymptomatic.  Encouraged pt to get home bp cuff so she can monitor bp at home daily and prn.  Stop amlodipine for now, restart if bp consistently >150/90. Discussed increasing oral hydration.  3) S/P THA (right) 10/2014: she got only a brief period of rehab but says she is w/out any pain at this time. Previous R LE edema has resolved. Has f/u with ortho 02/2015.  An After Visit Summary was printed and given to the patient.  FOLLOW UP: Return for keep appt w/me on 03/08/15.

## 2015-02-02 NOTE — Progress Notes (Signed)
Pre visit review using our clinic review tool, if applicable. No additional management support is needed unless otherwise documented below in the visit note. 

## 2015-02-02 NOTE — Patient Instructions (Signed)
Stop your amlodipine.  Restart amlodipine if your blood pressure is consistently >150 over 90.

## 2015-02-04 DIAGNOSIS — N289 Disorder of kidney and ureter, unspecified: Secondary | ICD-10-CM

## 2015-02-04 HISTORY — DX: Disorder of kidney and ureter, unspecified: N28.9

## 2015-02-08 ENCOUNTER — Ambulatory Visit (INDEPENDENT_AMBULATORY_CARE_PROVIDER_SITE_OTHER): Payer: Medicare Other | Admitting: Acute Care

## 2015-02-08 ENCOUNTER — Other Ambulatory Visit (INDEPENDENT_AMBULATORY_CARE_PROVIDER_SITE_OTHER): Payer: Medicare Other

## 2015-02-08 ENCOUNTER — Ambulatory Visit (INDEPENDENT_AMBULATORY_CARE_PROVIDER_SITE_OTHER)
Admission: RE | Admit: 2015-02-08 | Discharge: 2015-02-08 | Disposition: A | Discharge status: Home / Self Care | Payer: Medicare Other | Source: Ambulatory Visit | Attending: Acute Care | Admitting: Acute Care

## 2015-02-08 ENCOUNTER — Encounter: Payer: Self-pay | Admitting: Acute Care

## 2015-02-08 VITALS — BP 128/60 | HR 72 | Temp 97.7°F | Ht 60.0 in | Wt 144.0 lb

## 2015-02-08 DIAGNOSIS — J9601 Acute respiratory failure with hypoxia: Secondary | ICD-10-CM | POA: Diagnosis not present

## 2015-02-08 DIAGNOSIS — B370 Candidal stomatitis: Secondary | ICD-10-CM | POA: Diagnosis not present

## 2015-02-08 DIAGNOSIS — J441 Chronic obstructive pulmonary disease with (acute) exacerbation: Secondary | ICD-10-CM

## 2015-02-08 DIAGNOSIS — J189 Pneumonia, unspecified organism: Secondary | ICD-10-CM | POA: Diagnosis not present

## 2015-02-08 LAB — CBC WITH DIFFERENTIAL/PLATELET
Basophils Absolute: 0.1 10*3/uL (ref 0.0–0.1)
Basophils Relative: 0.6 % (ref 0.0–3.0)
Eosinophils Absolute: 0.1 10*3/uL (ref 0.0–0.7)
Eosinophils Relative: 0.9 % (ref 0.0–5.0)
HCT: 38.7 % (ref 36.0–46.0)
Hemoglobin: 12.2 g/dL (ref 12.0–15.0)
Lymphocytes Relative: 14.7 % (ref 12.0–46.0)
Lymphs Abs: 2.4 10*3/uL (ref 0.7–4.0)
MCHC: 31.6 g/dL (ref 30.0–36.0)
MCV: 82.4 fl (ref 78.0–100.0)
Monocytes Absolute: 1.2 10*3/uL — ABNORMAL HIGH (ref 0.1–1.0)
Monocytes Relative: 7.5 % (ref 3.0–12.0)
Neutro Abs: 12.5 10*3/uL — ABNORMAL HIGH (ref 1.4–7.7)
Neutrophils Relative %: 76.3 % (ref 43.0–77.0)
Platelets: 260 10*3/uL (ref 150.0–400.0)
RBC: 4.69 Mil/uL (ref 3.87–5.11)
RDW: 16.7 % — ABNORMAL HIGH (ref 11.5–15.5)
WBC: 16.4 10*3/uL — ABNORMAL HIGH (ref 4.0–10.5)

## 2015-02-08 LAB — BASIC METABOLIC PANEL
BUN: 11 mg/dL (ref 6–23)
CO2: 26 mEq/L (ref 19–32)
Calcium: 8.7 mg/dL (ref 8.4–10.5)
Chloride: 108 mEq/L (ref 96–112)
Creatinine, Ser: 0.75 mg/dL (ref 0.40–1.20)
GFR: 79.48 mL/min (ref >60.00)
Glucose, Bld: 59 mg/dL — ABNORMAL LOW (ref 70–99)
Potassium: 3.6 mEq/L (ref 3.5–5.1)
Sodium: 143 mEq/L (ref 135–145)

## 2015-02-08 MED ORDER — FIRST-DUKES MOUTHWASH MT SUSP: OROMUCOSAL | Status: DC

## 2015-02-08 MED ORDER — AZITHROMYCIN 250 MG PO TABS: ORAL_TABLET | ORAL | Status: DC

## 2015-02-08 NOTE — Patient Instructions (Addendum)
-   continue spiriva, advair, daliresp and prn proair. We will walk you today in the office to see how you do off the oxygen while walking. Wear  2 L oxygen for SaO2 of less than 88%, and when walking. Gradually increase activity. Listen to your body and don't over do it. Continue your prednisone taper. Continue your sinus irrigations. Continue your nasal gel. Z-pack for green nasal drainage and PND Eat yogurt while on antibiotic, or use probiotics OTC.+  Magic Mouthwash for oral thrush. Try Breath Right Strips for nasal congestion. Continue to try and eat well to help with strengthening. Follow up with [name]  On Jan. 17th, 2017. Hip replacement surgery clearance with [name] on 02/20/15. Please contact office for sooner follow up if symptoms do not improve or worsen or seek emergency care

## 2015-02-08 NOTE — Progress Notes (Signed)
Subjective:    Patient ID: Priscilla Cantrell, female    DOB: 06/08/1935, 80 y.o.   MRN: [id number]  HPI  CC:  Chief Complaint  Patient presents with  . Hospitalization Follow-up    Patient is a 80 y.o. Caucasian female who presents for hospital f/u for recent COPD exacerbation and R basilar pneumonia/atelectasis. Admitted 12/21-12/24, 2016 with acute resp failure. Reviewed d/c summary, notes, imaging, labs. She was d/c'd home on steroid taper and levaquin for 4 additional days. Went home on 2L oxygen 24/7. (Usually wears only at bedtime)         Significant tests:  Spirometry 01/10/09>>FEV1 0.97(52%), FEV1% 47 March 2012>>Daliresp started PSG 11/14/11 >> AHI 0.3 Echo 08/30/14 >> EF 60 to [id number], grade 1 diastolic dysfx, PAS 24 mmHG   FINDINGS:CXR 01/26/2015 There is slight atelectasis in the right base. Lungs elsewhere clear. Heart is upper normal in size with pulmonary vascularity within normal limits. There is atherosclerotic calcification in the aorta. No adenopathy. No bone lesions.  IMPRESSION: Slight right base atelectasis. No edema or consolidation. No change in cardiac silhouette.  EXAM: CHEST 2 VIEW 02/08/2015 COMPARISON: Portable chest x-ray of January 26, 2015  FINDINGS: The lungs are adequately inflated. There is no focal infiltrate. There are coarse lung markings at both lung bases which likely reflect scarring. There is no alveolar infiltrate. There is no pleural effusion. The heart and pulmonary vascularity are normal. There is mild stable tortuosity of the descending thoracic aorta. The bony thorax exhibits no acute abnormality.  IMPRESSION: There is no evidence of pneumonia. There is stable bibasilar scarring and stable changes of COPD.  CBC Latest Ref Rng 02/08/2015 01/26/2015 01/25/2015  WBC 4.0 - 10.5 K/uL 16.4(H) 18.9(H) 14.2(H)  Hemoglobin 12.0 - 15.0 g/dL 12.2 11.4(L) 11.4(L)  Hematocrit 36.0 - 46.0 % 38.7 35.2(L) 35.5(L)  Platelets  150.0 - 400.0 K/uL 260.0 290 239     PMHx >> Spinal stenosis, GERD, Hypothyroidism, Depression, Anxiety  PSHx, Medications, Allergies, Fhx, Shx reviewed.    Current outpatient prescriptions:  .  ADVAIR DISKUS 250-50 MCG/DOSE AEPB, INHALE 1 PUFF INTO THE LUNGS TWICE DAILY, Disp: 60 each, Rfl: 0 .  albuterol (PROAIR HFA) 108 (90 BASE) MCG/ACT inhaler, Inhale 2 puffs into the lungs every 4 (four) hours as needed. (Patient taking differently: Inhale 2 puffs into the lungs every 4 (four) hours as needed for wheezing or shortness of breath. ), Disp: 1 Inhaler, Rfl: 5 .  aspirin 325 MG tablet, Take 325 mg by mouth daily., Disp: , Rfl:  .  cetirizine (ZYRTEC) 10 MG tablet, Take 1 tablet (10 mg total) by mouth daily., Disp: 30 tablet, Rfl: 11 .  DALIRESP 500 MCG TABS tablet, TAKE 1 TABLET BY MOUTH DAILY, Disp: 90 tablet, Rfl: 0 .  diazepam (VALIUM) 5 MG tablet, Take 1 tablet (5 mg total) by mouth every 12 (twelve) hours as needed., Disp: 45 tablet, Rfl: 0 .  omeprazole (PRILOSEC) 20 MG capsule, Take 20 mg by mouth daily.  , Disp: , Rfl:  .  predniSONE (DELTASONE) 5 MG tablet, Label  & dispense according to the schedule below. 10 Pills PO for 3 days then, 8 Pills PO for 3 days, 6 Pills PO for 3 days, 4 Pills PO for 3 days, 2 Pills PO for 3 days, 1 Pills PO for 3 days, 1/2 Pill  PO for 3 days then STOP. Total 95 pills., Disp: 95 tablet, Rfl: 0 .  RESTASIS 0.05 % ophthalmic emulsion, Apply 1 drop  to eye 2 (two) times daily., Disp: , Rfl: 4 .  SPIRIVA HANDIHALER 18 MCG inhalation capsule, INHALE 1 CAPSULE VIA HANDIHALER EVERY DAY AT THE SAME TIME, Disp: 30 capsule, Rfl: 5 .  SYNTHROID 88 MCG tablet, TAKE 1 TABLET BY MOUTH DAILY, Disp: 30 tablet, Rfl: 6 .  venlafaxine (EFFEXOR) 50 MG tablet, Take 1 tablet (50 mg total) by mouth 2 (two) times daily. (Patient taking differently: Take 50 mg by mouth 2 (two) times daily. Takes 1 tab am, 1/2 tab pm), Disp: 60 tablet, Rfl: 3 .  amLODipine (NORVASC) 5 MG tablet,  Take 1 tablet (5 mg total) by mouth daily. (Patient not taking: Reported on 02/08/2015), Disp: 90 tablet, Rfl: 3 .  azithromycin (ZITHROMAX Z-PAK) 250 MG tablet, Take as directed, Disp: 6 each, Rfl: 0 .  Diphenhyd-Hydrocort-Nystatin (FIRST-DUKES MOUTHWASH) SUSP, Gargle and swallow 5 mL four times daily x 5 days, Disp: 100 mL, Rfl: 0 .  ferrous fumarate (HEMOCYTE - 106 MG FE) 325 (106 FE) MG TABS tablet, Take 1 tablet by mouth daily. Reported on 02/08/2015, Disp: , Rfl:   Hospital Follow up visit: 02/08/2015: She feels like she is gradually improving.Starts her 20 mg dose of taper 02/09/15  Head feels congested/full, but does not feel like she has chest congestion or tightness currently. Drainage is yellow green. Walked off O2, as she is requesting to stop using it during the day. Oxygen  on RA at rest 95%, with exertion goes down to 85 %. On 2L oxygen she gets above 96%., therefore instructed to use O2 while ambulating.  She says she is eating/drinking. Feels no dizziness upon standing. +Generalized weakness. No pain or swelling in LLs/ankles. No pain anywhere. Sounds like she had some poor PO intake and loose/frequent BMs prior to getting admitted. This has resolved.    Review of Systems Constitutional:   No  weight loss, night sweats,  Fevers, chills, fatigue, or  lassitude.  HEENT:   No headaches,  Difficulty swallowing,  Tooth/dental problems, or  Sore throat,                No sneezing, itching, ear ache, positive nasal congestion, positive post nasal drip,   CV:  No chest pain,  Orthopnea, PND, swelling in lower extremities, anasarca, dizziness, palpitations, syncope.   GI  No heartburn, indigestion, abdominal pain, nausea, vomiting, diarrhea, change in bowel habits, loss of appetite, bloody stools.   Resp: + shortness of breath with exertion not at rest.  No excess mucus, No productive cough,rare positive non productive cough,   No coughing up of blood.  +change in color of nasal  mucus.  No wheezing.  No chest wall deformity  Skin: no rash or lesions.  GU: no dysuria, change in color of urine, no urgency or frequency.  No flank pain, no hematuria   MS:  No joint pain or swelling.  No decreased range of motion.  No back pain.  Psych:  No change in mood or affect. No depression or anxiety.  No memory loss.     Objective:   Physical Exam Physical Exam:  General- Frail elderly female, No distress,  A&Ox3 ENT: No sinus tenderness, TM clear, pink  nasal mucosa, no oral exudate,positive post nasal drip, no LAN. Positive oral thrush. Cardiac: S1, S2, regular rate and rhythm, no murmur Chest: No wheeze/ rales/positive dullness per bases; no accessory muscle use, no nasal flaring, no sternal retractions Abd.: Soft Non-tender Ext: No edema Neuro:  Deconditioned, but getting stronger.  Skin: No rashes, warm and dry Psych: normal mood and behavior     Assessment & Plan:

## 2015-02-08 NOTE — Assessment & Plan Note (Signed)
Recovering from recent acute on chronic respiratory failure requiring hospitalization. CXR today 02/08/15 shows no evidence of pneumonia. There is stable bibasilar scarring and stable changes of COPD. Continues to need O2 while ambulating 2/2 desaturations into the 80's with exertion/ambulation.  Plan: Wear  2 L oxygen for SaO2 of less than 88%, and when walking. Continue your prednisone taper as previously prescribed Continue your sinus irrigations. Continue your nasal gel. Z-pack for green nasal drainage and PND Eat yogurt while on antibiotic, or use probiotics OTC.+ Follow up with [name] 02/20/15 at 2 pm

## 2015-02-08 NOTE — Assessment & Plan Note (Addendum)
Oral thrush after hospitalization and ongoing antibiotic treatment.  Plan: Magic Mouthwash swish and swallow 4 times daily for 5 days. Eat yogurt while on antibiotic, or use probiotics OTC.+  Follow up with [name] 02/20/15

## 2015-02-09 NOTE — Progress Notes (Signed)
Quick Note:  Called, spoke with pt. Discussed lab results and recs per SG. Pt verbalized understanding and is aware and understands the importance of eating breakfast in the mornings. She verbalized understanding, is in agreement with plan, and voiced no further questions or concerns at this time. ______

## 2015-02-13 NOTE — Progress Notes (Signed)
Reviewed and agree with assessment/plan. 

## 2015-02-15 ENCOUNTER — Ambulatory Visit: Payer: Medicare Other | Admitting: Family Medicine

## 2015-02-16 ENCOUNTER — Ambulatory Visit (INDEPENDENT_AMBULATORY_CARE_PROVIDER_SITE_OTHER): Payer: Medicare Other | Admitting: Family Medicine

## 2015-02-16 ENCOUNTER — Encounter: Payer: Self-pay | Admitting: Family Medicine

## 2015-02-16 VITALS — BP 112/71 | HR 72 | Temp 97.4°F | Resp 16 | Ht 60.0 in | Wt 143.8 lb

## 2015-02-16 DIAGNOSIS — M19072 Primary osteoarthritis, left ankle and foot: Secondary | ICD-10-CM

## 2015-02-16 DIAGNOSIS — E162 Hypoglycemia, unspecified: Secondary | ICD-10-CM

## 2015-02-16 DIAGNOSIS — M129 Arthropathy, unspecified: Secondary | ICD-10-CM

## 2015-02-16 NOTE — Progress Notes (Signed)
OFFICE VISIT  02/16/2015   CC:  Chief Complaint  Patient presents with  . Joint Swelling    left ankle  . Hypoglycemia   HPI:    Patient is a 80 y.o. Caucasian female who presents (not wearing her supplemental oxygen!) for noting medial aspect of L ankle with focal swelling about 3 days ago.  No bite mark on skin.  It does hurt sometimes when she walks, pain not as bad as initially.  Remainder of ankle and LL look/feel fine. No fevers.  Not esquisitely sensitive or painful like past gout flares.  Also asks if I can explain her recent low glucose on labs done 02/08/15 (it was 59 and she did not feel sx's of hypoglycemia).  She had not eaten anything yet that day.  Home bp checks 110-112 over 70s.  Rare measurement 150s/80s.   She is no longer on any bp med.  Breathing feeling better every day.  Only requiring supplemental oxyg if doing housework. She is finishing up her weening off of steroids pretty soon.  She has now officially retired!   Past Medical History  Diagnosis Date  . Hypertension     EKG 03/2010 normal  . COPD (chronic obstructive pulmonary disease) ([place])     spirometry 01/10/09 FEV 0.97(52%), FEV1% 47  . Hypoxemia     O2 86% RA with exertion 01/10/09, 2 liters oxygen with exertion and sleep  . Pulmonary nodule     CT chest 12/10, June 2011.  No nodule on CT 06/2010.  Marland Kitchen Spinal stenosis     Symptomatic (neurogenic claudication)  . GERD (gastroesophageal reflux disease)   . Hypothyroidism     Hashimoto's, dx'd 1989  . Diverticulosis of colon     Procto '97 and colonoscopy 2002  . DDD (degenerative disc disease), cervical     1998 MRI C5-6 impingemt (left)  . DDD (degenerative disc disease), lumbar   . Spinal stenosis of lumbar region with neurogenic claudication     2008 MRI (L3-4, L4-5, L5-S1)  . Fibrocystic breast disease     w/fibroadenoma.  Bx's showed NO atypia (Dr. Margot Chimes).  Mammo neg 2008.  Marland Kitchen Chest pain, non-cardiac     Cardiac CT showed no signif  obstructive dz, mildly elevated calcium level (Dr. [name]).  . History of double vision     Ophthalmologist, Dr. Herbert Deaner, is further evaluating this with MRI  orbits and limited brain (myesthenia gravis testing neg 07/2010)  . Depression with anxiety 04/15/2011  . Asthma   . Dysplastic nevus of upper extremity 04/2014    R tricep ([name])  . Osteoarthritis, hip, bilateral     End stage; R THA  . History of home oxygen therapy     uses 2 liters at hs  . Family history of adverse reaction to anesthesia     mother died when patient age 60 receiving anesthesia for thyroid surgery  . Chronic hypoxemic respiratory failure ([place]) 02/02/2015    2L oxygen 24/7    Past Surgical History  Procedure Laterality Date  . Cholecystectomy  2001  . Tubal ligation  1974  . Abdominal hysterectomy  1978    no BSO per pt--nonmalignant reasons  . Breast cyst excision      Last screening mammogram 10/2010 was normal.  . Appendectomy    . Tonsillectomy    . Colonoscopy  04/2005    NORMAL-recall 10 yrs  . Transthoracic echocardiogram  08/2014    EF 60-65%, grade I diast dysfxn  .  Total hip arthroplasty Right 11/01/2014    Procedure: RIGHT TOTAL HIP ARTHROPLASTY;  Surgeon: Latanya Maudlin, MD;  Location: [facility];  Service: Orthopedics;  Laterality: Right;    Outpatient Prescriptions Prior to Visit  Medication Sig Dispense Refill  . ADVAIR DISKUS 250-50 MCG/DOSE AEPB INHALE 1 PUFF INTO THE LUNGS TWICE DAILY 60 each 0  . albuterol (PROAIR HFA) 108 (90 BASE) MCG/ACT inhaler Inhale 2 puffs into the lungs every 4 (four) hours as needed. (Patient taking differently: Inhale 2 puffs into the lungs every 4 (four) hours as needed for wheezing or shortness of breath. ) 1 Inhaler 5  . aspirin 325 MG tablet Take 325 mg by mouth daily.    . cetirizine (ZYRTEC) 10 MG tablet Take 1 tablet (10 mg total) by mouth daily. 30 tablet 11  . DALIRESP 500 MCG TABS tablet TAKE 1 TABLET BY MOUTH DAILY 90 tablet 0  . diazepam (VALIUM)  5 MG tablet Take 1 tablet (5 mg total) by mouth every 12 (twelve) hours as needed. 45 tablet 0  . omeprazole (PRILOSEC) 20 MG capsule Take 20 mg by mouth daily.      . predniSONE (DELTASONE) 5 MG tablet Label  & dispense according to the schedule below. 10 Pills PO for 3 days then, 8 Pills PO for 3 days, 6 Pills PO for 3 days, 4 Pills PO for 3 days, 2 Pills PO for 3 days, 1 Pills PO for 3 days, 1/2 Pill  PO for 3 days then STOP. Total 95 pills. 95 tablet 0  . RESTASIS 0.05 % ophthalmic emulsion Apply 1 drop to eye 2 (two) times daily.  4  . SPIRIVA HANDIHALER 18 MCG inhalation capsule INHALE 1 CAPSULE VIA HANDIHALER EVERY DAY AT THE SAME TIME 30 capsule 5  . SYNTHROID 88 MCG tablet TAKE 1 TABLET BY MOUTH DAILY 30 tablet 6  . venlafaxine (EFFEXOR) 50 MG tablet Take 1 tablet (50 mg total) by mouth 2 (two) times daily. 60 tablet 3  . ferrous fumarate (HEMOCYTE - 106 MG FE) 325 (106 FE) MG TABS tablet Take 1 tablet by mouth daily. Reported on 02/16/2015    . amLODipine (NORVASC) 5 MG tablet Take 1 tablet (5 mg total) by mouth daily. (Patient not taking: Reported on 02/08/2015) 90 tablet 3  . azithromycin (ZITHROMAX Z-PAK) 250 MG tablet Take as directed (Patient not taking: Reported on 02/16/2015) 6 each 0  . Diphenhyd-Hydrocort-Nystatin (FIRST-DUKES MOUTHWASH) SUSP Gargle and swallow 5 mL four times daily x 5 days (Patient not taking: Reported on 02/16/2015) 100 mL 0   No facility-administered medications prior to visit.    Allergies  Allergen Reactions  . Losartan Other (See Comments)    hyperkalemia  . Penicillins Hives, Swelling and Rash    Has patient had a PCN reaction causing immediate rash, facial/tongue/throat swelling, SOB or lightheadedness with hypotension: YES Has patient had a PCN reaction causing severe rash involving mucus membranes or skin necrosis: NO Has patient had a PCN reaction that required hospitalization NO Has patient had a PCN reaction occurring within the last 10 years:  NO If all of the above answers are "NO", then may proceed with Cephalosporin use.   . Cefixime [Kdc:Cefixime] Other (See Comments)    unspecified  . Indocin [Indomethacin] Other (See Comments)    Painful tongue and throat  . Sulfa Antibiotics Other (See Comments)    unspecified  . Ace Inhibitors Other (See Comments)    cough  . Statins Other (See Comments)  Myalgias     ROS As per HPI  PE: Blood pressure 112/71, pulse 72, temperature 97.4 F (36.3 C), temperature source Oral, resp. rate 16, height 5' (1.524 m), weight 143 lb 12 oz (65.205 kg), SpO2 95 %. Gen: Alert, well appearing.  Patient is oriented to person, place, time, and situation. AFFECT: pleasant, lucid thought and speech. L ankle with mild focal swelling, erythema, warmth, and tenderness over the bony prominence just inferior to medial malleolus.  No streaking.  No foot or LE edema.  LABS:  None today  IMPRESSION AND PLAN:  1) L ankle arthritis: possible gout.  Doubt soft tissue or bony infection. Decided on watchful waiting approach at this time.  No med changes/additions: she is already on a systemic steroid taper for her COPD. Signs/symptoms to call or return for were reviewed and pt expressed understanding.  2) Recent hypoglycemia on BMET, asymptomatic.  I can't explain this really, but did encourage patient to not skip any meals.  She continues to improve regarding her recovery from her latest COPD exac/pneumonia. Her bp remains stable off of bp meds: plan to restart bp med if persistently >160/90.  I think she will do a lot better overall now that she has officially retired.  An After Visit Summary was printed and given to the patient.  FOLLOW UP: She'll keep her appt with me already set up for 03/08/15.

## 2015-02-16 NOTE — Progress Notes (Signed)
Pre visit review using our clinic review tool, if applicable. No additional management support is needed unless otherwise documented below in the visit note. 

## 2015-02-19 ENCOUNTER — Encounter (HOSPITAL_BASED_OUTPATIENT_CLINIC_OR_DEPARTMENT_OTHER): Payer: Self-pay | Admitting: *Deleted

## 2015-02-19 ENCOUNTER — Emergency Department (HOSPITAL_BASED_OUTPATIENT_CLINIC_OR_DEPARTMENT_OTHER)
Admission: EM | Admit: 2015-02-19 | Discharge: 2015-02-19 | Disposition: A | Discharge status: Home / Self Care | Payer: Medicare Other | Source: Home / Self Care | Attending: Emergency Medicine | Admitting: Emergency Medicine

## 2015-02-19 ENCOUNTER — Emergency Department (HOSPITAL_BASED_OUTPATIENT_CLINIC_OR_DEPARTMENT_OTHER): Payer: Medicare Other

## 2015-02-19 DIAGNOSIS — B029 Zoster without complications: Secondary | ICD-10-CM | POA: Diagnosis not present

## 2015-02-19 DIAGNOSIS — L03116 Cellulitis of left lower limb: Secondary | ICD-10-CM | POA: Diagnosis not present

## 2015-02-19 DIAGNOSIS — F418 Other specified anxiety disorders: Secondary | ICD-10-CM | POA: Insufficient documentation

## 2015-02-19 DIAGNOSIS — Z7982 Long term (current) use of aspirin: Secondary | ICD-10-CM | POA: Insufficient documentation

## 2015-02-19 DIAGNOSIS — R1031 Right lower quadrant pain: Secondary | ICD-10-CM

## 2015-02-19 DIAGNOSIS — J9611 Chronic respiratory failure with hypoxia: Secondary | ICD-10-CM | POA: Diagnosis not present

## 2015-02-19 DIAGNOSIS — Z87891 Personal history of nicotine dependence: Secondary | ICD-10-CM | POA: Insufficient documentation

## 2015-02-19 DIAGNOSIS — M160 Bilateral primary osteoarthritis of hip: Secondary | ICD-10-CM

## 2015-02-19 DIAGNOSIS — N10 Acute tubulo-interstitial nephritis: Secondary | ICD-10-CM | POA: Diagnosis not present

## 2015-02-19 DIAGNOSIS — E039 Hypothyroidism, unspecified: Secondary | ICD-10-CM | POA: Insufficient documentation

## 2015-02-19 DIAGNOSIS — K219 Gastro-esophageal reflux disease without esophagitis: Secondary | ICD-10-CM

## 2015-02-19 DIAGNOSIS — Z8742 Personal history of other diseases of the female genital tract: Secondary | ICD-10-CM

## 2015-02-19 DIAGNOSIS — Z881 Allergy status to other antibiotic agents status: Secondary | ICD-10-CM | POA: Diagnosis not present

## 2015-02-19 DIAGNOSIS — R109 Unspecified abdominal pain: Secondary | ICD-10-CM

## 2015-02-19 DIAGNOSIS — Z79899 Other long term (current) drug therapy: Secondary | ICD-10-CM

## 2015-02-19 DIAGNOSIS — J449 Chronic obstructive pulmonary disease, unspecified: Secondary | ICD-10-CM | POA: Insufficient documentation

## 2015-02-19 DIAGNOSIS — Z86018 Personal history of other benign neoplasm: Secondary | ICD-10-CM

## 2015-02-19 DIAGNOSIS — Z880 Allergy status to penicillin: Secondary | ICD-10-CM | POA: Insufficient documentation

## 2015-02-19 DIAGNOSIS — Z9049 Acquired absence of other specified parts of digestive tract: Secondary | ICD-10-CM | POA: Insufficient documentation

## 2015-02-19 DIAGNOSIS — Z9851 Tubal ligation status: Secondary | ICD-10-CM | POA: Insufficient documentation

## 2015-02-19 DIAGNOSIS — I10 Essential (primary) hypertension: Secondary | ICD-10-CM

## 2015-02-19 DIAGNOSIS — Z8669 Personal history of other diseases of the nervous system and sense organs: Secondary | ICD-10-CM | POA: Insufficient documentation

## 2015-02-19 DIAGNOSIS — N390 Urinary tract infection, site not specified: Secondary | ICD-10-CM | POA: Diagnosis not present

## 2015-02-19 DIAGNOSIS — R0902 Hypoxemia: Secondary | ICD-10-CM | POA: Diagnosis not present

## 2015-02-19 DIAGNOSIS — M549 Dorsalgia, unspecified: Secondary | ICD-10-CM | POA: Diagnosis not present

## 2015-02-19 DIAGNOSIS — Z9981 Dependence on supplemental oxygen: Secondary | ICD-10-CM | POA: Insufficient documentation

## 2015-02-19 LAB — URINE MICROSCOPIC-ADD ON

## 2015-02-19 LAB — URINALYSIS, ROUTINE W REFLEX MICROSCOPIC
Bilirubin Urine: NEGATIVE
Glucose, UA: NEGATIVE mg/dL
Hgb urine dipstick: NEGATIVE
Ketones, ur: NEGATIVE mg/dL
Nitrite: NEGATIVE
Protein, ur: NEGATIVE mg/dL
Specific Gravity, Urine: 1.013 (ref 1.005–1.030)
pH: 7 (ref 5.0–8.0)

## 2015-02-19 LAB — CBC WITH DIFFERENTIAL/PLATELET
Basophils Absolute: 0 10*3/uL (ref 0.0–0.1)
Basophils Relative: 0 %
Eosinophils Absolute: 0.2 10*3/uL (ref 0.0–0.7)
Eosinophils Relative: 2 %
HCT: 39.7 % (ref 36.0–46.0)
Hemoglobin: 12.4 g/dL (ref 12.0–15.0)
Lymphocytes Relative: 11 %
Lymphs Abs: 1 10*3/uL (ref 0.7–4.0)
MCH: 26 pg (ref 26.0–34.0)
MCHC: 31.2 g/dL (ref 30.0–36.0)
MCV: 83.2 fL (ref 78.0–100.0)
Monocytes Absolute: 0.6 10*3/uL (ref 0.1–1.0)
Monocytes Relative: 7 %
Neutro Abs: 6.8 10*3/uL (ref 1.7–7.7)
Neutrophils Relative %: 80 %
Platelets: 273 10*3/uL (ref 150–400)
RBC: 4.77 MIL/uL (ref 3.87–5.11)
RDW: 16.6 % — ABNORMAL HIGH (ref 11.5–15.5)
WBC: 8.5 10*3/uL (ref 4.0–10.5)

## 2015-02-19 LAB — COMPREHENSIVE METABOLIC PANEL
ALT: 14 U/L (ref 14–54)
AST: 16 U/L (ref 15–41)
Albumin: 3.8 g/dL (ref 3.5–5.0)
Alkaline Phosphatase: 77 U/L (ref 38–126)
Anion gap: 12 (ref 5–15)
BUN: 10 mg/dL (ref 6–20)
CO2: 26 mmol/L (ref 22–32)
Calcium: 9.5 mg/dL (ref 8.9–10.3)
Chloride: 103 mmol/L (ref 101–111)
Creatinine, Ser: 0.76 mg/dL (ref 0.44–1.00)
GFR calc Af Amer: >60 mL/min (ref >60)
GFR calc non Af Amer: >60 mL/min (ref >60)
Glucose, Bld: 113 mg/dL — ABNORMAL HIGH (ref 65–99)
Potassium: 4 mmol/L (ref 3.5–5.1)
Sodium: 141 mmol/L (ref 135–145)
Total Bilirubin: 0.4 mg/dL (ref 0.3–1.2)
Total Protein: 7.1 g/dL (ref 6.5–8.1)

## 2015-02-19 MED ORDER — SODIUM CHLORIDE 0.9 % IV BOLUS (SEPSIS)
1000.0000 mL | Freq: Once | INTRAVENOUS | Status: AC
  Administered 2015-02-19: 1000 mL via INTRAVENOUS

## 2015-02-19 MED ORDER — MORPHINE SULFATE (PF) 4 MG/ML IV SOLN
4.0000 mg | Freq: Once | INTRAVENOUS | Status: AC
  Administered 2015-02-19: 4 mg via INTRAVENOUS
  Filled 2015-02-19: qty 1

## 2015-02-19 MED ORDER — HYDROCODONE-ACETAMINOPHEN 5-325 MG PO TABS: 1.0000 | ORAL_TABLET | Freq: Four times a day (QID) | ORAL | Status: DC | PRN

## 2015-02-19 MED ORDER — IOHEXOL 300 MG/ML  SOLN
100.0000 mL | Freq: Once | INTRAMUSCULAR | Status: AC | PRN
  Administered 2015-02-19: 100 mL via INTRAVENOUS

## 2015-02-19 MED ORDER — IOHEXOL 300 MG/ML  SOLN
25.0000 mL | Freq: Once | INTRAMUSCULAR | Status: AC | PRN
  Administered 2015-02-19: 25 mL via ORAL

## 2015-02-19 MED ORDER — ONDANSETRON HCL 4 MG PO TABS: 4.0000 mg | ORAL_TABLET | Freq: Four times a day (QID) | ORAL | Status: DC

## 2015-02-19 MED ORDER — ONDANSETRON HCL 4 MG/2ML IJ SOLN
4.0000 mg | Freq: Once | INTRAMUSCULAR | Status: AC
  Administered 2015-02-19: 4 mg via INTRAVENOUS
  Filled 2015-02-19: qty 2

## 2015-02-19 MED FILL — HYDROCODON-APAP 5-325: 5-325 | 2 days supply | Qty: 15 | Fill #0

## 2015-02-19 MED FILL — ONDANSETRON HCL 4 MG TABLET: 4 | 4 days supply | Qty: 12 | Fill #0

## 2015-02-19 NOTE — ED Provider Notes (Signed)
CSN: [id number]     Arrival date & time 02/19/15  1023 History   First MD Initiated Contact with Patient 02/19/15 1037     Chief Complaint  Patient presents with  . Flank Pain     (Consider location/radiation/quality/duration/timing/severity/associated sxs/prior Treatment) Patient is a 80 y.o. female presenting with flank pain. The history is provided by the patient.  Flank Pain This is a new problem. The current episode started yesterday. The problem occurs constantly. The problem has been gradually worsening. Associated symptoms include abdominal pain. Associated symptoms comments: Pain starts in the right side and moves into the right lower quadrant. She has been nauseated and had one episode of vomiting upon arrival here. She denies any fever, leg pain, dysuria, frequency, urgency or hematuria. Normal bowel movement yesterday. She has not had an appetite and has not had anything to eaten since the pain became severe. Nothing (Nothing seems to make it worse and is just bad all the time. Very sharp in nature and 8 out of 10) aggravates the symptoms. Relieved by: Minimal improvement with some Vicodin she had left over from her hip surgery. The treatment provided no relief.    Past Medical History  Diagnosis Date  . Hypertension     EKG 03/2010 normal  . COPD (chronic obstructive pulmonary disease) ([place])     spirometry 01/10/09 FEV 0.97(52%), FEV1% 47  . Hypoxemia     O2 86% RA with exertion 01/10/09, 2 liters oxygen with exertion and sleep  . Pulmonary nodule     CT chest 12/10, June 2011.  No nodule on CT 06/2010.  Marland Kitchen Spinal stenosis     Symptomatic (neurogenic claudication)  . GERD (gastroesophageal reflux disease)   . Hypothyroidism     Hashimoto's, dx'd 1989  . Diverticulosis of colon     Procto '97 and colonoscopy 2002  . DDD (degenerative disc disease), cervical     1998 MRI C5-6 impingemt (left)  . DDD (degenerative disc disease), lumbar   . Spinal stenosis of lumbar region with  neurogenic claudication     2008 MRI (L3-4, L4-5, L5-S1)  . Fibrocystic breast disease     w/fibroadenoma.  Bx's showed NO atypia (Dr. Margot Chimes).  Mammo neg 2008.  Marland Kitchen Chest pain, non-cardiac     Cardiac CT showed no signif obstructive dz, mildly elevated calcium level (Dr. [name]).  . History of double vision     Ophthalmologist, Dr. Herbert Deaner, is further evaluating this with MRI  orbits and limited brain (myesthenia gravis testing neg 07/2010)  . Depression with anxiety 04/15/2011  . Asthma   . Dysplastic nevus of upper extremity 04/2014    R tricep ([name])  . Osteoarthritis, hip, bilateral     End stage; R THA  . History of home oxygen therapy     uses 2 liters at hs  . Family history of adverse reaction to anesthesia     mother died when patient age 62 receiving anesthesia for thyroid surgery  . Chronic hypoxemic respiratory failure ([place]) 02/02/2015    2L oxygen 24/7   Past Surgical History  Procedure Laterality Date  . Cholecystectomy  2001  . Tubal ligation  1974  . Abdominal hysterectomy  1978    no BSO per pt--nonmalignant reasons  . Breast cyst excision      Last screening mammogram 10/2010 was normal.  . Appendectomy    . Tonsillectomy    . Colonoscopy  04/2005    NORMAL-recall 10 yrs  . Transthoracic echocardiogram  08/2014    EF 60-65%, grade I diast dysfxn  . Total hip arthroplasty Right 11/01/2014    Procedure: RIGHT TOTAL HIP ARTHROPLASTY;  Surgeon: Latanya Maudlin, MD;  Location: [facility];  Service: Orthopedics;  Laterality: Right;   Family History  Problem Relation Age of Onset  . Goiter Mother   . Psoriasis Father     od liver  . Diabetes Daughter     Type 2  . Cancer Paternal Aunt     stomach?  . Stroke Maternal Grandfather   . Stroke Paternal Grandmother   . Hypertension Paternal Grandmother   . Hernia Paternal Grandmother   . Cancer Paternal Grandfather     lung  . Cirrhosis Father    Social History  Substance Use Topics  . Smoking status: Former  Smoker -- 2.00 packs/day for 35 years    Types: Cigarettes    Quit date: 02/03/1990  . Smokeless tobacco: Never Used  . Alcohol Use: Yes     Comment: Rarely   OB History    No data available     Review of Systems  Gastrointestinal: Positive for abdominal pain.  Genitourinary: Positive for flank pain.  All other systems reviewed and are negative.     Allergies  Losartan; Penicillins; Cefixime; Indocin; Sulfa antibiotics; Ace inhibitors; and Statins  Home Medications   Prior to Admission medications   Medication Sig Start Date End Date Taking? Authorizing Provider  ADVAIR DISKUS 250-50 MCG/DOSE AEPB INHALE 1 PUFF INTO THE LUNGS TWICE DAILY 12/15/14   Chesley Mires, MD  albuterol (PROAIR HFA) 108 (90 BASE) MCG/ACT inhaler Inhale 2 puffs into the lungs every 4 (four) hours as needed. Patient taking differently: Inhale 2 puffs into the lungs every 4 (four) hours as needed for wheezing or shortness of breath.  10/07/13   [name], MD  aspirin 325 MG tablet Take 325 mg by mouth daily.    Historical Provider, MD  cetirizine (ZYRTEC) 10 MG tablet Take 1 tablet (10 mg total) by mouth daily. 10/11/14   [name], MD  DALIRESP 500 MCG TABS tablet TAKE 1 TABLET BY MOUTH DAILY 01/30/15   Chesley Mires, MD  diazepam (VALIUM) 5 MG tablet Take 1 tablet (5 mg total) by mouth every 12 (twelve) hours as needed. 07/25/14   Irene Pap, NP  ferrous fumarate (HEMOCYTE - 106 MG FE) 325 (106 FE) MG TABS tablet Take 1 tablet by mouth daily. Reported on 02/16/2015    Historical Provider, MD  omeprazole (PRILOSEC) 20 MG capsule Take 20 mg by mouth daily.      Historical Provider, MD  predniSONE (DELTASONE) 5 MG tablet Label  & dispense according to the schedule below. 10 Pills PO for 3 days then, 8 Pills PO for 3 days, 6 Pills PO for 3 days, 4 Pills PO for 3 days, 2 Pills PO for 3 days, 1 Pills PO for 3 days, 1/2 Pill  PO for 3 days then STOP. Total 95 pills. 01/27/15   Thurnell Lose, MD  RESTASIS  0.05 % ophthalmic emulsion Apply 1 drop to eye 2 (two) times daily. 03/14/14   Historical Provider, MD  SPIRIVA HANDIHALER 18 MCG inhalation capsule INHALE 1 CAPSULE VIA HANDIHALER EVERY DAY AT THE SAME TIME 12/20/14   Chesley Mires, MD  SYNTHROID 88 MCG tablet TAKE 1 TABLET BY MOUTH DAILY 08/01/14   [name], MD  venlafaxine (EFFEXOR) 50 MG tablet Take 1 tablet (50 mg total) by mouth 2 (two)  times daily. 09/20/14   [name], MD   BP 153/91 mmHg  Pulse 88  Temp(Src) 97.9 F (36.6 C) (Oral)  Resp 18  Ht 5\' 1"  (1.549 m)  Wt 138 lb (62.596 kg)  BMI 26.09 kg/m2  SpO2 92% Physical Exam  Constitutional: She is oriented to person, place, and time. She appears well-developed and well-nourished. No distress.  HENT:  Head: Normocephalic and atraumatic.  Mouth/Throat: Oropharynx is clear and moist.  Eyes: Conjunctivae and EOM are normal. Pupils are equal, round, and reactive to light.  Neck: Normal range of motion. Neck supple.  Cardiovascular: Normal rate, regular rhythm and intact distal pulses.   No murmur heard. Pulmonary/Chest: Effort normal and breath sounds normal. No respiratory distress. She has no wheezes. She has no rales.  Abdominal: Soft. She exhibits no distension. There is tenderness in the right lower quadrant. There is guarding and CVA tenderness. There is no rebound.    Right CVA tenderness wrapping around to the right lower quadrant with some mild guarding  Musculoskeletal: Normal range of motion. She exhibits no edema or tenderness.  Well healing scar over the right hip no erythema, fluctuance or induration  Neurological: She is alert and oriented to person, place, and time.  Skin: Skin is warm and dry. No rash noted. No erythema.  Psychiatric: She has a normal mood and affect. Her behavior is normal.  Nursing note and vitals reviewed.   ED Course  Procedures (including critical care time) Labs Review Labs Reviewed  URINALYSIS, ROUTINE W REFLEX MICROSCOPIC  (NOT AT [facility]) - Abnormal; Notable for the following:    APPearance CLOUDY (*)    Leukocytes, UA MODERATE (*)    All other components within normal limits  URINE MICROSCOPIC-ADD ON - Abnormal; Notable for the following:    Squamous Epithelial / LPF 6-30 (*)    Bacteria, UA FEW (*)    All other components within normal limits  CBC WITH DIFFERENTIAL/PLATELET - Abnormal; Notable for the following:    RDW 16.6 (*)    All other components within normal limits  COMPREHENSIVE METABOLIC PANEL - Abnormal; Notable for the following:    Glucose, Bld 113 (*)    All other components within normal limits    Imaging Review Ct Abdomen Pelvis W Contrast  02/19/2015  CLINICAL DATA:  Right flank and right lower quadrant pain for 1 day. Prior cholecystectomy, appendectomy and hysterectomy. EXAM: CT ABDOMEN AND PELVIS WITH CONTRAST TECHNIQUE: Multidetector CT imaging of the abdomen and pelvis was performed using the standard protocol following bolus administration of intravenous contrast. CONTRAST:  6mL OMNIPAQUE IOHEXOL 300 MG/ML SOLN, 174mL OMNIPAQUE IOHEXOL 300 MG/ML SOLN COMPARISON:  No prior CT abdomen/ pelvis.  06/18/2010 coronary CT. FINDINGS: Lower chest: Right lower lobe 3 mm pulmonary nodule (series 3/ image 1) appears stable since 06/18/2010 and benign. Oral contrast is seen in the lower thoracic esophagus. Hepatobiliary: Simple 0.8 cm liver cyst in the anterior segment 4A left liver lobe. Otherwise normal liver. Cholecystectomy. No intrahepatic biliary ductal dilatation. Common bile duct diameter of 9 mm is within expected post cholecystectomy limits. No radiopaque choledocholithiasis. Pancreas: Normal, with no mass or duct dilation. Spleen: Normal size. No mass. Adrenals/Urinary Tract: Normal adrenals. No hydronephrosis. There is a 1.1 cm renal mass in the lateral lower right kidney (series 7/ image 21), which is minimally hypodense to the renal parenchyma. Bladder visualization is limited by streak  artifact from the right hip hardware. Minimal gas in the nondependent bladder lumen. No  bladder wall thickening. Stomach/Bowel: Grossly normal stomach. Normal caliber small bowel with no small bowel wall thickening. Status post appendectomy. Mild sigmoid diverticulosis, with no large bowel wall thickening or pericolonic fat stranding. There undigested pills throughout the colon. Vascular/Lymphatic: Atherosclerotic nonaneurysmal abdominal aorta. Patent portal, splenic, hepatic and renal veins. No pathologically enlarged lymph nodes in the abdomen or pelvis. Reproductive: Status post hysterectomy, with no abnormal findings at the vaginal cuff. No adnexal mass. Other: No pneumoperitoneum, ascites or focal fluid collection. Musculoskeletal: No aggressive appearing focal osseous lesions. Marked degenerative disc disease in the mid to lower lumbar spine. Status post right total hip arthroplasty, with no evidence of hardware fracture or loosening in the visualized portions of the right hip hardware. Severe left hip osteoarthritis. IMPRESSION: 1. No evidence of bowel obstruction or acute bowel inflammation. Mild sigmoid diverticulosis, with no evidence of acute diverticulitis. 2. No hydronephrosis. 3. Indeterminate 1.1 cm renal mass in the lower right kidney. Recommend further evaluation with MRI abdomen with and without intravenous contrast. 4. Oral contrast in the lower thoracic esophagus, suggesting esophageal dysmotility and/or gastroesophageal reflux. Electronically Signed   By: Ilona Sorrel M.D.   On: 02/19/2015 13:20   I have personally reviewed and evaluated these images and lab results as part of my medical decision-making.   EKG Interpretation None      MDM   Final diagnoses:  Right sided abdominal pain    Patient is a 80 year old female with a history of COPD, recent pneumonia approximate 3 weeks ago and prior cholecystectomy and appendectomy who presents today with sudden onset of severe right  flank pain radiating into her abdomen that started yesterday. It is cause one episode of vomiting but she denies any exacerbating factors. She has not eaten anything but denies any urinary or bowel changes. The pain does not go into her hip or her leg. She feels like she is breathing without difficulty.  On exam she has significant right flank and right lower quadrant abdominal tenderness. Concern for possible pyelonephritis versus kidney stone versus atypical AAA versus bowel obstruction versus colitis.  Given the abrupt nature colitis would be unusual. She denies any urinary symptoms however she does have 6-30 white blood cells and moderate leukocytes.  Patient given IV fluids, pain and nausea medication. CBC, CMP pending and patient will need a CT scan.  12:06 PM Labs wnl.  CT with contrast pending.  2:25 PM CT is negative for acute findings except for renal mass which will need MRI and that was discussed with the patient. On repeat evaluation there may be a very small area that could be the start of a vesicular rash as this may be shingles versus musculoskeletal. Patient at this time will be treated with pain control she has follow-up with her PCP and orthopedics. If patient does develop a rash consistent with shingles she will need to be treated.  Blanchie Dessert, MD 02/19/15 1427

## 2015-02-19 NOTE — ED Notes (Signed)
Dr Maryan Rued aware of differences in Pulse ox readings. Okay to discharge pt. Lungs clear x 2.

## 2015-02-19 NOTE — ED Notes (Signed)
Right flank pain since yesterday- states pain is severe- recent hospitalization for pneumonia at end of December and states her oxygen runs in the low 90s. Has home O2 if sats are below 88%

## 2015-02-19 NOTE — Discharge Instructions (Signed)

## 2015-02-19 NOTE — ED Notes (Signed)
Water given per pt request.

## 2015-02-20 ENCOUNTER — Encounter (HOSPITAL_BASED_OUTPATIENT_CLINIC_OR_DEPARTMENT_OTHER): Payer: Self-pay | Admitting: *Deleted

## 2015-02-20 ENCOUNTER — Inpatient Hospital Stay (HOSPITAL_BASED_OUTPATIENT_CLINIC_OR_DEPARTMENT_OTHER)
Admission: EM | Admit: 2015-02-20 | Discharge: 2015-02-22 | DRG: 690 | Disposition: A | Discharge status: Home / Self Care | Payer: Medicare Other | Attending: Internal Medicine | Admitting: Internal Medicine

## 2015-02-20 ENCOUNTER — Ambulatory Visit: Payer: Medicare Other | Admitting: Pulmonary Disease

## 2015-02-20 ENCOUNTER — Emergency Department (HOSPITAL_BASED_OUTPATIENT_CLINIC_OR_DEPARTMENT_OTHER): Payer: Medicare Other

## 2015-02-20 DIAGNOSIS — N2889 Other specified disorders of kidney and ureter: Secondary | ICD-10-CM | POA: Diagnosis present

## 2015-02-20 DIAGNOSIS — D509 Iron deficiency anemia, unspecified: Secondary | ICD-10-CM | POA: Diagnosis not present

## 2015-02-20 DIAGNOSIS — N10 Acute tubulo-interstitial nephritis: Secondary | ICD-10-CM | POA: Insufficient documentation

## 2015-02-20 DIAGNOSIS — R109 Unspecified abdominal pain: Secondary | ICD-10-CM | POA: Diagnosis not present

## 2015-02-20 DIAGNOSIS — M109 Gout, unspecified: Secondary | ICD-10-CM | POA: Diagnosis present

## 2015-02-20 DIAGNOSIS — K219 Gastro-esophageal reflux disease without esophagitis: Secondary | ICD-10-CM | POA: Diagnosis present

## 2015-02-20 DIAGNOSIS — I10 Essential (primary) hypertension: Secondary | ICD-10-CM | POA: Diagnosis present

## 2015-02-20 DIAGNOSIS — Z881 Allergy status to other antibiotic agents status: Secondary | ICD-10-CM

## 2015-02-20 DIAGNOSIS — R0902 Hypoxemia: Secondary | ICD-10-CM | POA: Diagnosis not present

## 2015-02-20 DIAGNOSIS — Z7982 Long term (current) use of aspirin: Secondary | ICD-10-CM

## 2015-02-20 DIAGNOSIS — Z87891 Personal history of nicotine dependence: Secondary | ICD-10-CM

## 2015-02-20 DIAGNOSIS — E038 Other specified hypothyroidism: Secondary | ICD-10-CM | POA: Diagnosis present

## 2015-02-20 DIAGNOSIS — E063 Autoimmune thyroiditis: Secondary | ICD-10-CM

## 2015-02-20 DIAGNOSIS — F418 Other specified anxiety disorders: Secondary | ICD-10-CM | POA: Diagnosis present

## 2015-02-20 DIAGNOSIS — Z9981 Dependence on supplemental oxygen: Secondary | ICD-10-CM

## 2015-02-20 DIAGNOSIS — J9611 Chronic respiratory failure with hypoxia: Secondary | ICD-10-CM | POA: Diagnosis present

## 2015-02-20 DIAGNOSIS — N390 Urinary tract infection, site not specified: Secondary | ICD-10-CM

## 2015-02-20 DIAGNOSIS — Z96641 Presence of right artificial hip joint: Secondary | ICD-10-CM | POA: Diagnosis present

## 2015-02-20 DIAGNOSIS — L03116 Cellulitis of left lower limb: Secondary | ICD-10-CM

## 2015-02-20 DIAGNOSIS — B029 Zoster without complications: Secondary | ICD-10-CM | POA: Diagnosis present

## 2015-02-20 DIAGNOSIS — J449 Chronic obstructive pulmonary disease, unspecified: Secondary | ICD-10-CM | POA: Diagnosis present

## 2015-02-20 DIAGNOSIS — E039 Hypothyroidism, unspecified: Secondary | ICD-10-CM | POA: Diagnosis present

## 2015-02-20 LAB — URINALYSIS, ROUTINE W REFLEX MICROSCOPIC
Glucose, UA: NEGATIVE mg/dL
Ketones, ur: 15 mg/dL — AB
Nitrite: NEGATIVE
Protein, ur: 100 mg/dL — AB
Specific Gravity, Urine: 1.043 — ABNORMAL HIGH (ref 1.005–1.030)
pH: 5.5 (ref 5.0–8.0)

## 2015-02-20 LAB — CBC WITH DIFFERENTIAL/PLATELET
Basophils Absolute: 0 10*3/uL (ref 0.0–0.1)
Basophils Relative: 0 %
Eosinophils Absolute: 0.1 10*3/uL (ref 0.0–0.7)
Eosinophils Relative: 2 %
HCT: 37.8 % (ref 36.0–46.0)
Hemoglobin: 11.6 g/dL — ABNORMAL LOW (ref 12.0–15.0)
Lymphocytes Relative: 10 %
Lymphs Abs: 0.9 10*3/uL (ref 0.7–4.0)
MCH: 26 pg (ref 26.0–34.0)
MCHC: 30.7 g/dL (ref 30.0–36.0)
MCV: 84.6 fL (ref 78.0–100.0)
Monocytes Absolute: 0.7 10*3/uL (ref 0.1–1.0)
Monocytes Relative: 8 %
Neutro Abs: 7.3 10*3/uL (ref 1.7–7.7)
Neutrophils Relative %: 80 %
Platelets: 254 10*3/uL (ref 150–400)
RBC: 4.47 MIL/uL (ref 3.87–5.11)
RDW: 16.7 % — ABNORMAL HIGH (ref 11.5–15.5)
WBC: 9 10*3/uL (ref 4.0–10.5)

## 2015-02-20 LAB — COMPREHENSIVE METABOLIC PANEL
ALT: 13 U/L — ABNORMAL LOW (ref 14–54)
AST: 15 U/L (ref 15–41)
Albumin: 3.5 g/dL (ref 3.5–5.0)
Alkaline Phosphatase: 69 U/L (ref 38–126)
Anion gap: 6 (ref 5–15)
BUN: 13 mg/dL (ref 6–20)
CO2: 28 mmol/L (ref 22–32)
Calcium: 9.1 mg/dL (ref 8.9–10.3)
Chloride: 105 mmol/L (ref 101–111)
Creatinine, Ser: 0.83 mg/dL (ref 0.44–1.00)
GFR calc Af Amer: >60 mL/min (ref >60)
GFR calc non Af Amer: >60 mL/min (ref >60)
Glucose, Bld: 120 mg/dL — ABNORMAL HIGH (ref 65–99)
Potassium: 4 mmol/L (ref 3.5–5.1)
Sodium: 139 mmol/L (ref 135–145)
Total Bilirubin: 0.2 mg/dL — ABNORMAL LOW (ref 0.3–1.2)
Total Protein: 6.6 g/dL (ref 6.5–8.1)

## 2015-02-20 LAB — URINE MICROSCOPIC-ADD ON

## 2015-02-20 LAB — CBC
HCT: 36.2 % (ref 36.0–46.0)
Hemoglobin: 11.3 g/dL — ABNORMAL LOW (ref 12.0–15.0)
MCH: 26.5 pg (ref 26.0–34.0)
MCHC: 31.2 g/dL (ref 30.0–36.0)
MCV: 85 fL (ref 78.0–100.0)
Platelets: 208 10*3/uL (ref 150–400)
RBC: 4.26 MIL/uL (ref 3.87–5.11)
RDW: 16.3 % — ABNORMAL HIGH (ref 11.5–15.5)
WBC: 7.1 10*3/uL (ref 4.0–10.5)

## 2015-02-20 LAB — LIPASE, BLOOD: Lipase: 17 U/L (ref 11–51)

## 2015-02-20 MED ORDER — SODIUM CHLORIDE 0.9 % IV BOLUS (SEPSIS)
500.0000 mL | Freq: Once | INTRAVENOUS | Status: AC
  Administered 2015-02-20: 500 mL via INTRAVENOUS

## 2015-02-20 MED ORDER — ROFLUMILAST 500 MCG PO TABS
500.0000 ug | ORAL_TABLET | Freq: Every day | ORAL | Status: DC
  Administered 2015-02-21 – 2015-02-22 (×2): 500 ug via ORAL
  Filled 2015-02-20 (×2): qty 1

## 2015-02-20 MED ORDER — TIOTROPIUM BROMIDE MONOHYDRATE 18 MCG IN CAPS
18.0000 ug | ORAL_CAPSULE | Freq: Every day | RESPIRATORY_TRACT | Status: DC
  Administered 2015-02-21: 18 ug via RESPIRATORY_TRACT
  Filled 2015-02-20 (×2): qty 5

## 2015-02-20 MED ORDER — SENNOSIDES-DOCUSATE SODIUM 8.6-50 MG PO TABS: 1.0000 | ORAL_TABLET | Freq: Every evening | ORAL | Status: DC | PRN

## 2015-02-20 MED ORDER — HYDROCODONE-ACETAMINOPHEN 5-325 MG PO TABS
1.0000 | ORAL_TABLET | ORAL | Status: DC | PRN
  Administered 2015-02-21 – 2015-02-22 (×5): 2 via ORAL
  Filled 2015-02-20 (×6): qty 2

## 2015-02-20 MED ORDER — SODIUM CHLORIDE 0.9 % IV SOLN
INTRAVENOUS | Status: DC
  Administered 2015-02-20 – 2015-02-22 (×3): via INTRAVENOUS

## 2015-02-20 MED ORDER — ALBUTEROL SULFATE (2.5 MG/3ML) 0.083% IN NEBU: 3.0000 mL | INHALATION_SOLUTION | RESPIRATORY_TRACT | Status: DC | PRN

## 2015-02-20 MED ORDER — HYDROMORPHONE HCL 1 MG/ML IJ SOLN
0.5000 mg | INTRAMUSCULAR | Status: DC | PRN
  Administered 2015-02-20 – 2015-02-21 (×3): 0.5 mg via INTRAVENOUS
  Filled 2015-02-20 (×3): qty 1

## 2015-02-20 MED ORDER — ACETAMINOPHEN 650 MG RE SUPP: 650.0000 mg | Freq: Four times a day (QID) | RECTAL | Status: DC | PRN

## 2015-02-20 MED ORDER — VANCOMYCIN HCL IN DEXTROSE 1-5 GM/200ML-% IV SOLN
1000.0000 mg | Freq: Once | INTRAVENOUS | Status: AC
  Administered 2015-02-20: 1000 mg via INTRAVENOUS
  Filled 2015-02-20: qty 200

## 2015-02-20 MED ORDER — VENLAFAXINE HCL 50 MG PO TABS
50.0000 mg | ORAL_TABLET | Freq: Two times a day (BID) | ORAL | Status: DC
Start: 2015-02-20 — End: 2015-02-22
  Administered 2015-02-20 – 2015-02-22 (×4): 50 mg via ORAL
  Filled 2015-02-20 (×7): qty 1

## 2015-02-20 MED ORDER — MOMETASONE FURO-FORMOTEROL FUM 100-5 MCG/ACT IN AERO
2.0000 | INHALATION_SPRAY | Freq: Two times a day (BID) | RESPIRATORY_TRACT | Status: DC
  Administered 2015-02-20 – 2015-02-21 (×2): 2 via RESPIRATORY_TRACT
  Filled 2015-02-20 (×2): qty 8.8

## 2015-02-20 MED ORDER — FENTANYL CITRATE (PF) 100 MCG/2ML IJ SOLN
50.0000 ug | Freq: Once | INTRAMUSCULAR | Status: AC
  Administered 2015-02-20: 50 ug via INTRAVENOUS
  Filled 2015-02-20: qty 2

## 2015-02-20 MED ORDER — LEVOFLOXACIN IN D5W 750 MG/150ML IV SOLN
750.0000 mg | INTRAVENOUS | Status: DC
  Administered 2015-02-21 – 2015-02-22 (×2): 750 mg via INTRAVENOUS
  Filled 2015-02-20 (×2): qty 150

## 2015-02-20 MED ORDER — ASPIRIN 325 MG PO TABS
325.0000 mg | ORAL_TABLET | Freq: Every day | ORAL | Status: DC
Start: 2015-02-21 — End: 2015-02-22
  Administered 2015-02-21 – 2015-02-22 (×2): 325 mg via ORAL
  Filled 2015-02-20 (×2): qty 1

## 2015-02-20 MED ORDER — ONDANSETRON HCL 4 MG/2ML IJ SOLN
4.0000 mg | Freq: Four times a day (QID) | INTRAMUSCULAR | Status: DC | PRN
Start: 2015-02-20 — End: 2015-02-22

## 2015-02-20 MED ORDER — LEVOFLOXACIN IN D5W 750 MG/150ML IV SOLN
750.0000 mg | Freq: Once | INTRAVENOUS | Status: AC
  Administered 2015-02-20: 750 mg via INTRAVENOUS
  Filled 2015-02-20: qty 150

## 2015-02-20 MED ORDER — ONDANSETRON HCL 4 MG/2ML IJ SOLN
4.0000 mg | Freq: Once | INTRAMUSCULAR | Status: AC
  Administered 2015-02-20: 4 mg via INTRAVENOUS
  Filled 2015-02-20: qty 2

## 2015-02-20 MED ORDER — LEVOTHYROXINE SODIUM 88 MCG PO TABS
88.0000 ug | ORAL_TABLET | Freq: Every day | ORAL | Status: DC
  Administered 2015-02-21 – 2015-02-22 (×2): 88 ug via ORAL
  Filled 2015-02-20 (×2): qty 1

## 2015-02-20 MED ORDER — ONDANSETRON HCL 4 MG PO TABS: 4.0000 mg | ORAL_TABLET | Freq: Four times a day (QID) | ORAL | Status: DC | PRN

## 2015-02-20 MED ORDER — HYDROCODONE-ACETAMINOPHEN 5-325 MG PO TABS: 1.0000 | ORAL_TABLET | Freq: Four times a day (QID) | ORAL | Status: DC | PRN

## 2015-02-20 MED ORDER — ACETAMINOPHEN 325 MG PO TABS: 650.0000 mg | ORAL_TABLET | Freq: Four times a day (QID) | ORAL | Status: DC | PRN

## 2015-02-20 MED ORDER — PANTOPRAZOLE SODIUM 40 MG PO TBEC
40.0000 mg | DELAYED_RELEASE_TABLET | Freq: Every day | ORAL | Status: DC
  Administered 2015-02-21 – 2015-02-22 (×2): 40 mg via ORAL
  Filled 2015-02-20 (×2): qty 1

## 2015-02-20 MED ORDER — FERROUS FUMARATE 325 (106 FE) MG PO TABS
1.0000 | ORAL_TABLET | Freq: Every day | ORAL | Status: DC
  Administered 2015-02-21 – 2015-02-22 (×2): 106 mg via ORAL
  Filled 2015-02-20 (×2): qty 1

## 2015-02-20 MED ORDER — ENOXAPARIN SODIUM 40 MG/0.4ML ~~LOC~~ SOLN
40.0000 mg | SUBCUTANEOUS | Status: DC
  Administered 2015-02-21 – 2015-02-22 (×2): 40 mg via SUBCUTANEOUS
  Filled 2015-02-20 (×2): qty 0.4

## 2015-02-20 NOTE — ED Notes (Signed)
Patient transported to X-ray 

## 2015-02-20 NOTE — ED Provider Notes (Signed)
CSN: [id number]     Arrival date & time 02/20/15  [id number] History   First MD Initiated Contact with Patient 02/20/15 1002     Chief Complaint  Patient presents with  . Back Pain  . Dysuria     (Consider location/radiation/quality/duration/timing/severity/associated sxs/prior Treatment) HPI Patient presents with right flank pain and dysuria worsened since being seen yesterday. Patient states she's had shaking chills but denies nausea or vomiting. Had CT scan performed yesterday which showed small renal mass on the right side. Concern for developing rash on the right flank concerning for shingles. Patient states she has had the shingles vaccine. Patient also has had persistent hypoxia with some shortness of breath. She was recently admitted for COPD exacerbation and pneumonia. States she's been on prednisone until recently. Occasionally uses home oxygen for hypoxia. Past Medical History  Diagnosis Date  . Hypertension     EKG 03/2010 normal  . COPD (chronic obstructive pulmonary disease) ([place])     spirometry 01/10/09 FEV 0.97(52%), FEV1% 47  . Hypoxemia     O2 86% RA with exertion 01/10/09, 2 liters oxygen with exertion and sleep  . Pulmonary nodule     CT chest 12/10, June 2011.  No nodule on CT 06/2010.  Marland Kitchen Spinal stenosis     Symptomatic (neurogenic claudication)  . GERD (gastroesophageal reflux disease)   . Hypothyroidism     Hashimoto's, dx'd 1989  . Diverticulosis of colon     Procto '97 and colonoscopy 2002  . DDD (degenerative disc disease), cervical     1998 MRI C5-6 impingemt (left)  . DDD (degenerative disc disease), lumbar   . Spinal stenosis of lumbar region with neurogenic claudication     2008 MRI (L3-4, L4-5, L5-S1)  . Fibrocystic breast disease     w/fibroadenoma.  Bx's showed NO atypia (Dr. Margot Chimes).  Mammo neg 2008.  Marland Kitchen Chest pain, non-cardiac     Cardiac CT showed no signif obstructive dz, mildly elevated calcium level (Dr. [name]).  . History of double vision      Ophthalmologist, Dr. Herbert Deaner, is further evaluating this with MRI  orbits and limited brain (myesthenia gravis testing neg 07/2010)  . Depression with anxiety 04/15/2011  . Asthma   . Dysplastic nevus of upper extremity 04/2014    R tricep ([name])  . Osteoarthritis, hip, bilateral     End stage; R THA  . History of home oxygen therapy     uses 2 liters at hs  . Family history of adverse reaction to anesthesia     mother died when patient age 66 receiving anesthesia for thyroid surgery  . Chronic hypoxemic respiratory failure ([place]) 02/02/2015    2L oxygen 24/7   Past Surgical History  Procedure Laterality Date  . Cholecystectomy  2001  . Tubal ligation  1974  . Abdominal hysterectomy  1978    no BSO per pt--nonmalignant reasons  . Breast cyst excision      Last screening mammogram 10/2010 was normal.  . Appendectomy    . Tonsillectomy    . Colonoscopy  04/2005    NORMAL-recall 10 yrs  . Transthoracic echocardiogram  08/2014    EF 60-65%, grade I diast dysfxn  . Total hip arthroplasty Right 11/01/2014    Procedure: RIGHT TOTAL HIP ARTHROPLASTY;  Surgeon: Latanya Maudlin, MD;  Location: [facility];  Service: Orthopedics;  Laterality: Right;   Family History  Problem Relation Age of Onset  . Goiter Mother   . Psoriasis Father  od liver  . Diabetes Daughter     Type 2  . Cancer Paternal Aunt     stomach?  . Stroke Maternal Grandfather   . Stroke Paternal Grandmother   . Hypertension Paternal Grandmother   . Hernia Paternal Grandmother   . Cancer Paternal Grandfather     lung  . Cirrhosis Father    Social History  Substance Use Topics  . Smoking status: Former Smoker -- 2.00 packs/day for 35 years    Types: Cigarettes    Quit date: 02/03/1990  . Smokeless tobacco: Never Used  . Alcohol Use: Yes     Comment: Rarely   OB History    No data available     Review of Systems  Constitutional: Positive for chills and fatigue. Negative for fever.  Respiratory: Positive for  shortness of breath. Negative for cough.   Cardiovascular: Negative for chest pain.  Gastrointestinal: Positive for abdominal pain. Negative for nausea, vomiting and diarrhea.  Genitourinary: Positive for dysuria and flank pain. Negative for hematuria and difficulty urinating.  Musculoskeletal: Positive for back pain. Negative for neck pain and neck stiffness.  Skin: Positive for rash. Negative for wound.  Neurological: Negative for dizziness, weakness, light-headedness, numbness and headaches.  All other systems reviewed and are negative.     Allergies  Losartan; Penicillins; Cefixime; Indocin; Sulfa antibiotics; Ace inhibitors; and Statins  Home Medications   Prior to Admission medications   Medication Sig Start Date End Date Taking? Authorizing Provider  ADVAIR DISKUS 250-50 MCG/DOSE AEPB INHALE 1 PUFF INTO THE LUNGS TWICE DAILY 12/15/14   Chesley Mires, MD  albuterol (PROAIR HFA) 108 (90 BASE) MCG/ACT inhaler Inhale 2 puffs into the lungs every 4 (four) hours as needed. Patient taking differently: Inhale 2 puffs into the lungs every 4 (four) hours as needed for wheezing or shortness of breath.  10/07/13   [name], MD  aspirin 325 MG tablet Take 325 mg by mouth daily.    Historical Provider, MD  cetirizine (ZYRTEC) 10 MG tablet Take 1 tablet (10 mg total) by mouth daily. 10/11/14   [name], MD  DALIRESP 500 MCG TABS tablet TAKE 1 TABLET BY MOUTH DAILY 01/30/15   Chesley Mires, MD  diazepam (VALIUM) 5 MG tablet Take 1 tablet (5 mg total) by mouth every 12 (twelve) hours as needed. 07/25/14   Irene Pap, NP  ferrous fumarate (HEMOCYTE - 106 MG FE) 325 (106 FE) MG TABS tablet Take 1 tablet by mouth daily. Reported on 02/16/2015    Historical Provider, MD  HYDROcodone-acetaminophen (NORCO/VICODIN) 5-325 MG tablet Take 1-2 tablets by mouth every 6 (six) hours as needed. 02/19/15   Blanchie Dessert, MD  omeprazole (PRILOSEC) 20 MG capsule Take 20 mg by mouth daily.      Historical  Provider, MD  ondansetron (ZOFRAN) 4 MG tablet Take 1 tablet (4 mg total) by mouth every 6 (six) hours. 02/19/15   Blanchie Dessert, MD  predniSONE (DELTASONE) 5 MG tablet Label  & dispense according to the schedule below. 10 Pills PO for 3 days then, 8 Pills PO for 3 days, 6 Pills PO for 3 days, 4 Pills PO for 3 days, 2 Pills PO for 3 days, 1 Pills PO for 3 days, 1/2 Pill  PO for 3 days then STOP. Total 95 pills. 01/27/15   Thurnell Lose, MD  RESTASIS 0.05 % ophthalmic emulsion Apply 1 drop to eye 2 (two) times daily. 03/14/14   Historical Provider, MD  [facility]  HANDIHALER 18 MCG inhalation capsule INHALE 1 CAPSULE VIA HANDIHALER EVERY DAY AT THE SAME TIME 12/20/14   Chesley Mires, MD  SYNTHROID 88 MCG tablet TAKE 1 TABLET BY MOUTH DAILY 08/01/14   [name], MD  venlafaxine (EFFEXOR) 50 MG tablet Take 1 tablet (50 mg total) by mouth 2 (two) times daily. 09/20/14   [name], MD   BP 136/65 mmHg  Pulse 73  Temp(Src) 98.8 F (37.1 C) (Oral)  Resp 16  Ht 5\' 1"  (1.549 m)  Wt 140 lb (63.504 kg)  BMI 26.47 kg/m2  SpO2 96% Physical Exam  Constitutional: She is oriented to person, place, and time. She appears well-developed and well-nourished. No distress.  HENT:  Head: Normocephalic and atraumatic.  Mouth/Throat: Oropharynx is clear and moist.  Eyes: EOM are normal. Pupils are equal, round, and reactive to light.  Neck: Normal range of motion. Neck supple.  Cardiovascular: Normal rate and regular rhythm.  Exam reveals no gallop and no friction rub.   No murmur heard. Pulmonary/Chest: Effort normal and breath sounds normal. No respiratory distress. She has no wheezes. She has no rales. She exhibits no tenderness.  Abdominal: Soft. Bowel sounds are normal. She exhibits no distension and no mass. There is tenderness (tenderness to deep palpation in the right upper quadrant. There is no rebound or guarding.). There is no rebound and no guarding.  Musculoskeletal: Normal range of motion.  She exhibits tenderness. She exhibits no edema.  Patient has tenderness over the right flank. No midline thoracic or lumbar tenderness. Patient also with left medial foot swelling, erythema and tenderness to palpation. Distal pulses intact. Full range of motion of the left ankle without pain.  Neurological: She is alert and oriented to person, place, and time.  5/5 motor in all extremities. Sensation is fully intact.  Skin: Skin is warm and dry. Rash noted. There is erythema.  Patient with erythematous papular rash over the right flank area extending to the right side of the upper abdomen. Does not appear to cross midline.  Psychiatric: She has a normal mood and affect. Her behavior is normal.  Nursing note and vitals reviewed.   ED Course  Procedures (including critical care time) Labs Review Labs Reviewed  URINALYSIS, ROUTINE W REFLEX MICROSCOPIC (NOT AT [facility]) - Abnormal; Notable for the following:    Color, Urine RED (*)    APPearance TURBID (*)    Specific Gravity, Urine 1.043 (*)    Hgb urine dipstick LARGE (*)    Bilirubin Urine MODERATE (*)    Ketones, ur 15 (*)    Protein, ur 100 (*)    Leukocytes, UA MODERATE (*)    All other components within normal limits  URINE MICROSCOPIC-ADD ON - Abnormal; Notable for the following:    Squamous Epithelial / LPF 0-5 (*)    Bacteria, UA MANY (*)    Crystals CA OXALATE CRYSTALS (*)    All other components within normal limits  CBC WITH DIFFERENTIAL/PLATELET - Abnormal; Notable for the following:    Hemoglobin 11.6 (*)    RDW 16.7 (*)    All other components within normal limits  COMPREHENSIVE METABOLIC PANEL - Abnormal; Notable for the following:    Glucose, Bld 120 (*)    ALT 13 (*)    Total Bilirubin 0.2 (*)    All other components within normal limits  CULTURE, BLOOD (ROUTINE X 2)  CULTURE, BLOOD (ROUTINE X 2)  LIPASE, BLOOD    Imaging Review Dg Chest  2 View  02/20/2015  CLINICAL DATA:  Right flank pain since yesterday.   Hypoxia. EXAM: CHEST  2 VIEW COMPARISON:  02/08/2015 FINDINGS: There is mild bilateral chronic interstitial thickening. There is no focal parenchymal opacity. There is no pleural effusion or pneumothorax. The heart and mediastinal contours are unremarkable. The osseous structures are unremarkable. IMPRESSION: No active cardiopulmonary disease. Electronically Signed   By: Kathreen Devoid   On: 02/20/2015 11:07   Ct Abdomen Pelvis W Contrast  02/19/2015  CLINICAL DATA:  Right flank and right lower quadrant pain for 1 day. Prior cholecystectomy, appendectomy and hysterectomy. EXAM: CT ABDOMEN AND PELVIS WITH CONTRAST TECHNIQUE: Multidetector CT imaging of the abdomen and pelvis was performed using the standard protocol following bolus administration of intravenous contrast. CONTRAST:  71mL OMNIPAQUE IOHEXOL 300 MG/ML SOLN, 173mL OMNIPAQUE IOHEXOL 300 MG/ML SOLN COMPARISON:  No prior CT abdomen/ pelvis.  06/18/2010 coronary CT. FINDINGS: Lower chest: Right lower lobe 3 mm pulmonary nodule (series 3/ image 1) appears stable since 06/18/2010 and benign. Oral contrast is seen in the lower thoracic esophagus. Hepatobiliary: Simple 0.8 cm liver cyst in the anterior segment 4A left liver lobe. Otherwise normal liver. Cholecystectomy. No intrahepatic biliary ductal dilatation. Common bile duct diameter of 9 mm is within expected post cholecystectomy limits. No radiopaque choledocholithiasis. Pancreas: Normal, with no mass or duct dilation. Spleen: Normal size. No mass. Adrenals/Urinary Tract: Normal adrenals. No hydronephrosis. There is a 1.1 cm renal mass in the lateral lower right kidney (series 7/ image 21), which is minimally hypodense to the renal parenchyma. Bladder visualization is limited by streak artifact from the right hip hardware. Minimal gas in the nondependent bladder lumen. No bladder wall thickening. Stomach/Bowel: Grossly normal stomach. Normal caliber small bowel with no small bowel wall thickening. Status  post appendectomy. Mild sigmoid diverticulosis, with no large bowel wall thickening or pericolonic fat stranding. There undigested pills throughout the colon. Vascular/Lymphatic: Atherosclerotic nonaneurysmal abdominal aorta. Patent portal, splenic, hepatic and renal veins. No pathologically enlarged lymph nodes in the abdomen or pelvis. Reproductive: Status post hysterectomy, with no abnormal findings at the vaginal cuff. No adnexal mass. Other: No pneumoperitoneum, ascites or focal fluid collection. Musculoskeletal: No aggressive appearing focal osseous lesions. Marked degenerative disc disease in the mid to lower lumbar spine. Status post right total hip arthroplasty, with no evidence of hardware fracture or loosening in the visualized portions of the right hip hardware. Severe left hip osteoarthritis. IMPRESSION: 1. No evidence of bowel obstruction or acute bowel inflammation. Mild sigmoid diverticulosis, with no evidence of acute diverticulitis. 2. No hydronephrosis. 3. Indeterminate 1.1 cm renal mass in the lower right kidney. Recommend further evaluation with MRI abdomen with and without intravenous contrast. 4. Oral contrast in the lower thoracic esophagus, suggesting esophageal dysmotility and/or gastroesophageal reflux. Electronically Signed   By: Ilona Sorrel M.D.   On: 02/19/2015 13:20   I have personally reviewed and evaluated these images and lab results as part of my medical decision-making.   EKG Interpretation None      MDM   Final diagnoses:  UTI (lower urinary tract infection)  Right flank pain  Cellulitis of left lower extremity   Patient with extended course of prednisone presenting with dysuria and right-sided flank pain. UA without obvious evidence of uric tract infection. Questionable right-sided pyelonephritis though early shingles rash is certainly a possibility. Patient also has evidence of left foot cellulitis. Do not believe this is a septic joint in that the erythema  does not overlie the  joint and patient has full range of motion of the joint without pain. Blood cultures drawn and will repeat labs. Also given patient's hypoxia will repeat chest x-ray. Likely need for admission.  Patient with penicillin/cephalosporin allergy. Started on Levaquin and neck myosin for UTI and cellulitis. Vital signs remained stable emergency department. Discuss with Dr. Barbaraann Faster who accepted the patient in transfer to a MedSurg bed for observation.  Julianne Rice, MD 02/20/15 1309

## 2015-02-20 NOTE — H&P (Signed)
History and Physical  Patient Name: Priscilla Cantrell     [name]    DOB: 1935-10-10    DOA: 02/20/2015 Referring physician: Julianne Rice, MD PCP: [name], MD      Chief Complaint: Flank pain  HPI: [name] is a 80 y.o. female with a past medical history significant for COPD on home O2 since pneumonia in Dec 2016, HTN, low back pain, hypothyroidism, and depression who presents with flank pain.  The patient has been packet home after discharge from the hospital for pneumonia 1 month ago recuperating. She seemed to be getting better until the last few days when she started to develop right-sided flank pain. The pain is constant, stabbing and aching, located in the right flank and radiating to the front. Also associated with hematuria and dysuria. The pain was bad enough and associated with shaking chills and so she came to the ER yesterday where she was diagnosed with shingles and sent home with hydrocodone and ondansetron. This helped last night, but today the pain was back and more severe so she came to the ER.  In the ED, she was tachycardic and hypoxic but afebrile. Urinalysis showed full field RBCs, WBCs, and bacteria.  Of note there is a question of some ankle redness and swelling from a couple weeks ago that seemed to be getting better. This was evidently very red and patient first presented to the emergency room, there was concern for cellulitis. The patient's clear to me that this red spot has been getting better. She does have a history of gout.   Review of Systems:  Pt complains of flank pain, hematuria, dysuria, ankle redness, right flank rash. Pt denies any fever, vomiting, diarrhea.  All other systems negative except as just noted or noted in the history of present illness.  Allergies  Allergen Reactions  . Losartan Other (See Comments)    hyperkalemia  . Penicillins Hives, Swelling and Rash    Has patient had a PCN reaction causing immediate rash,  facial/tongue/throat swelling, SOB or lightheadedness with hypotension: YES Has patient had a PCN reaction causing severe rash involving mucus membranes or skin necrosis: NO Has patient had a PCN reaction that required hospitalization NO Has patient had a PCN reaction occurring within the last 10 years: NO If all of the above answers are "NO", then may proceed with Cephalosporin use.   . Cefixime [Kdc:Cefixime] Other (See Comments)    unspecified  . Indocin [Indomethacin] Other (See Comments)    Painful tongue and throat  . Sulfa Antibiotics Other (See Comments)    unspecified  . Ace Inhibitors Other (See Comments)    cough  . Statins Other (See Comments)    Myalgias     Prior to Admission medications   Medication Sig Start Date End Date Taking? Authorizing Provider  ADVAIR DISKUS 250-50 MCG/DOSE AEPB INHALE 1 PUFF INTO THE LUNGS TWICE DAILY 12/15/14   Chesley Mires, MD  albuterol (PROAIR HFA) 108 (90 BASE) MCG/ACT inhaler Inhale 2 puffs into the lungs every 4 (four) hours as needed. Patient taking differently: Inhale 2 puffs into the lungs every 4 (four) hours as needed for wheezing or shortness of breath.  10/07/13   [name], MD  aspirin 325 MG tablet Take 325 mg by mouth daily.    Historical Provider, MD  cetirizine (ZYRTEC) 10 MG tablet Take 1 tablet (10 mg total) by mouth daily. 10/11/14   [name], MD  DALIRESP 500 MCG TABS tablet  TAKE 1 TABLET BY MOUTH DAILY 01/30/15   Chesley Mires, MD  diazepam (VALIUM) 5 MG tablet Take 1 tablet (5 mg total) by mouth every 12 (twelve) hours as needed. 07/25/14   Irene Pap, NP  ferrous fumarate (HEMOCYTE - 106 MG FE) 325 (106 FE) MG TABS tablet Take 1 tablet by mouth daily. Reported on 02/16/2015    Historical Provider, MD  HYDROcodone-acetaminophen (NORCO/VICODIN) 5-325 MG tablet Take 1-2 tablets by mouth every 6 (six) hours as needed. 02/19/15   Blanchie Dessert, MD  omeprazole (PRILOSEC) 20 MG capsule Take 20 mg by mouth daily.       Historical Provider, MD  ondansetron (ZOFRAN) 4 MG tablet Take 1 tablet (4 mg total) by mouth every 6 (six) hours. 02/19/15   Blanchie Dessert, MD  predniSONE (DELTASONE) 5 MG tablet Label  & dispense according to the schedule below. 10 Pills PO for 3 days then, 8 Pills PO for 3 days, 6 Pills PO for 3 days, 4 Pills PO for 3 days, 2 Pills PO for 3 days, 1 Pills PO for 3 days, 1/2 Pill  PO for 3 days then STOP. Total 95 pills. 01/27/15   Thurnell Lose, MD  RESTASIS 0.05 % ophthalmic emulsion Apply 1 drop to eye 2 (two) times daily. 03/14/14   Historical Provider, MD  SPIRIVA HANDIHALER 18 MCG inhalation capsule INHALE 1 CAPSULE VIA HANDIHALER EVERY DAY AT THE SAME TIME 12/20/14   Chesley Mires, MD  SYNTHROID 88 MCG tablet TAKE 1 TABLET BY MOUTH DAILY 08/01/14   [name], MD  venlafaxine (EFFEXOR) 50 MG tablet Take 1 tablet (50 mg total) by mouth 2 (two) times daily. 09/20/14   [name], MD    Past Medical History  Diagnosis Date  . Hypertension     EKG 03/2010 normal  . COPD (chronic obstructive pulmonary disease) ([place])     spirometry 01/10/09 FEV 0.97(52%), FEV1% 47  . Hypoxemia     O2 86% RA with exertion 01/10/09, 2 liters oxygen with exertion and sleep  . Pulmonary nodule     CT chest 12/10, June 2011.  No nodule on CT 06/2010.  Marland Kitchen Spinal stenosis     Symptomatic (neurogenic claudication)  . GERD (gastroesophageal reflux disease)   . Hypothyroidism     Hashimoto's, dx'd 1989  . Diverticulosis of colon     Procto '97 and colonoscopy 2002  . DDD (degenerative disc disease), cervical     1998 MRI C5-6 impingemt (left)  . DDD (degenerative disc disease), lumbar   . Spinal stenosis of lumbar region with neurogenic claudication     2008 MRI (L3-4, L4-5, L5-S1)  . Fibrocystic breast disease     w/fibroadenoma.  Bx's showed NO atypia (Dr. Margot Chimes).  Mammo neg 2008.  Marland Kitchen Chest pain, non-cardiac     Cardiac CT showed no signif obstructive dz, mildly elevated calcium level (Dr.  [name]).  . History of double vision     Ophthalmologist, Dr. Herbert Deaner, is further evaluating this with MRI  orbits and limited brain (myesthenia gravis testing neg 07/2010)  . Depression with anxiety 04/15/2011  . Asthma   . Dysplastic nevus of upper extremity 04/2014    R tricep ([name])  . Osteoarthritis, hip, bilateral     End stage; R THA  . History of home oxygen therapy     uses 2 liters at hs  . Family history of adverse reaction to anesthesia     mother died when patient  age 57 receiving anesthesia for thyroid surgery  . Chronic hypoxemic respiratory failure ([place]) 02/02/2015    2L oxygen 24/7    Past Surgical History  Procedure Laterality Date  . Cholecystectomy  2001  . Tubal ligation  1974  . Abdominal hysterectomy  1978    no BSO per pt--nonmalignant reasons  . Breast cyst excision      Last screening mammogram 10/2010 was normal.  . Appendectomy    . Tonsillectomy    . Colonoscopy  04/2005    NORMAL-recall 10 yrs  . Transthoracic echocardiogram  08/2014    EF 60-65%, grade I diast dysfxn  . Total hip arthroplasty Right 11/01/2014    Procedure: RIGHT TOTAL HIP ARTHROPLASTY;  Surgeon: Latanya Maudlin, MD;  Location: [facility];  Service: Orthopedics;  Laterality: Right;    Family history: family history includes Cancer in her paternal aunt and paternal grandfather; Cirrhosis in her father; Diabetes in her daughter; Goiter in her mother; Hernia in her paternal grandmother; Hypertension in her paternal grandmother; Psoriasis in her father; Stroke in her maternal grandfather and paternal grandmother.  Social History: Patient lives by herself in an apare[place]net near her daughter.  She is a remote former smoker.  She does not use alcohol.  She does not use a cane.  She has a dog.  She worked as a Solicitor.       Physical Exam: BP 171/63 mmHg  Pulse 68  Temp(Src) 98.7 F (37.1 C) (Oral)  Resp 19  Ht 5\' 1"  (1.549 m)  Wt 68.3 kg (150 lb 9.2 oz)  BMI 28.47 kg/m2   SpO2 93% General appearance: Well-developed, adult female, alert and in no acute distress.  Tired appearing, moderate pain in back.   Eyes: Anicteric, conjunctiva pink, lids and lashes normal.     ENT: No nasal deformity, discharge, or epistaxis.  OP moist without lesions.   Skin: Warm and dry.  No jaundice.  There is a chain of 5-6 small clusters of pink, non-vesicular papules without excoriation low on the right flank in to the groin.  On the LEFT ankle, a small area of redness is outlined, without surrounding warmth or erythema. Cardiac: RRR, nl S1-S2, no murmurs appreciated.  Capillary refill is brisk.  No LE edema.  Radial pulses 2+ and symmetric. Respiratory: Normal respiratory rate and rhythm.  CTAB without rales or wheezes. Abdomen: Abdomen soft without rigidity.  Mild diffuse TTP. No ascites, distension.   MSK: No deformities or effusions.  There is point tenderenss over the navicular on the LEFT ankle. Neuro: Sensorium intact and responding to questions, attention normal.  Speech is fluent.  Moves all extremities equally and with normal coordination.   Cranial nerves normal. Psych: Behavior appropriate.  Affect normal.  No evidence of aural or visual hallucinations or delusions.       Labs on Admission:  The metabolic panel shows normal electrolytes and renal function.  Bicarb high normal.  Glucose normal. LFTs normal. UA shows full field WBC and RBC, and also Ca oxalate crystals. Lipase normal. The complete blood count shows no leukocytosis, chronic stable anemia.   Radiological Exams on Admission: Personally reviewed: Dg Chest 2 View 02/20/2015  Chronic bilateral streaky opacities, fibrosis.  No new focal infiltrate.   Ct Abdomen Pelvis W Contrast 02/19/2015 "IMPRESSION: 1. No evidence of bowel obstruction or acute bowel inflammation. Mild sigmoid diverticulosis, with no evidence of acute diverticulitis. 2. No hydronephrosis. 3. Indeterminate 1.1 cm renal mass in the lower  right kidney.  Recommend further evaluation with MRI abdomen with and without intravenous contrast. 4. Oral contrast in the lower thoracic esophagus, suggesting esophageal dysmotility and/or gastroesophageal reflux."      Assessment/Plan 1. UTI and flank pain:  Mild pyelonephritis, based on flank pain.  The patient has tachycardia but no other systemic inflammation.  No history of DR organisms in our system.  Penicillin and cephalosporin allergy.  Other causes of her flank pain could be shingles (mild case in someone vaccinated) or renal stone.  Pain from renal mass doubted.  Muscle strain or DDD also possible, but doubted.  CT abdomen from yesterday showed no hydronephrosis or stone, noted that contrasted CT less than ideal for imaging renal stone. -Levofloxacin 750 mg IV daily -Urine culture added -Hydrocodone-acetaminophen PRN for pain -If pain poorly controlled overnight, flat radiograph or non-contrast CT for renal stone would be reasonable. -Gentle IVF, to be discontinued overnight if taking PO tomorrow   2. COPD with chronic respiratory failure:  Used to be on nocturnal O2.  On continuous O2 since discharge last month for pneumonia, COPD exacerbation. -Continue supplemental O2 -Continue home roflimulast -Continue home Advair and Spiriva  3. Ankle swelling/redness:  This is improving before today, per patient.  This appears to be a peroneal tendinitis or arthritis.  Cellulitis is doubted.   -I will discontinue vancomycin and monitor clinically.  4. Anemia:  Stable. -Continue home iron  5. Renal mass:  Patient aware.   -Follow up MRI with and without contrast as outpatient  6. Depression/anxiety:  -Contineu home venlafaxine  7. HTN:  Normotensive at admission.  Has been off antihypertensives lately because of hypotension. -Cotninue to hold home amlodipine  8. Hypothyroidism:  Stable.  -Continue home levothyroxine    DVT PPx: Lovenox Diet: Regular Consultants:  None Code Status: Full Family Communication: Daughter, present at bedside. CODE STATUS confirmed.  Medical decision making: What exists of the patient's previous chart was reviewed in depth. Patient seen 9:33 PM on 02/20/2015.  Disposition Plan:  Admit to observation for UTI/pyelonephritis.  The patient's chronic respiratory failure, multiple antibiotic allergies, and worsening pain poorly controlled with outpatient treatment I believe warrant hospital observation overnight.It is anticipated that the patient may be ready for discharge within 24-48 hours.      Edwin Dada [facility] Pager [phone]

## 2015-02-20 NOTE — ED Notes (Signed)
Seen here yesterday and d/c home with dx of possible shingles. States pain was worse this morning so came back

## 2015-02-20 NOTE — ED Notes (Signed)
Pt. Wanting more of the pain meds.  EDP would like to wait a bit due to Pt. Just had approx. 1 hr ago.  Explained to pt. That we need to wait a bit.  Pt. Okay with this process.  Pt. Reports pain "not bad".

## 2015-02-20 NOTE — ED Notes (Signed)
Pt also c/o dysuria which is new since yesterday

## 2015-02-20 NOTE — ED Notes (Signed)
MD at bedside. 

## 2015-02-20 NOTE — ED Notes (Signed)
[facility] is aware of bed 6N25 for transfer

## 2015-02-20 NOTE — Progress Notes (Signed)
80 year old female HTN, COPD, GERD, hypothyroidism presenting with progressive worsening pyelonephritis and possible right foot and ankle cellulitis. No evidence of sepsis. AF VSS. Patient to be transferred from [facility] [place] to [facility] for medical bed for observation and IV antibiotics.   Linna Darner, MD Family Medicine 02/20/2015, 1:01 PM

## 2015-02-21 DIAGNOSIS — J449 Chronic obstructive pulmonary disease, unspecified: Secondary | ICD-10-CM | POA: Diagnosis present

## 2015-02-21 DIAGNOSIS — Z7982 Long term (current) use of aspirin: Secondary | ICD-10-CM | POA: Diagnosis not present

## 2015-02-21 DIAGNOSIS — N390 Urinary tract infection, site not specified: Secondary | ICD-10-CM | POA: Diagnosis not present

## 2015-02-21 DIAGNOSIS — Z881 Allergy status to other antibiotic agents status: Secondary | ICD-10-CM | POA: Diagnosis not present

## 2015-02-21 DIAGNOSIS — B029 Zoster without complications: Secondary | ICD-10-CM | POA: Diagnosis present

## 2015-02-21 DIAGNOSIS — M549 Dorsalgia, unspecified: Secondary | ICD-10-CM | POA: Diagnosis not present

## 2015-02-21 DIAGNOSIS — E039 Hypothyroidism, unspecified: Secondary | ICD-10-CM | POA: Diagnosis present

## 2015-02-21 DIAGNOSIS — Z96641 Presence of right artificial hip joint: Secondary | ICD-10-CM | POA: Diagnosis present

## 2015-02-21 DIAGNOSIS — N2889 Other specified disorders of kidney and ureter: Secondary | ICD-10-CM | POA: Diagnosis present

## 2015-02-21 DIAGNOSIS — K219 Gastro-esophageal reflux disease without esophagitis: Secondary | ICD-10-CM | POA: Diagnosis present

## 2015-02-21 DIAGNOSIS — D509 Iron deficiency anemia, unspecified: Secondary | ICD-10-CM | POA: Diagnosis not present

## 2015-02-21 DIAGNOSIS — I10 Essential (primary) hypertension: Secondary | ICD-10-CM | POA: Diagnosis not present

## 2015-02-21 DIAGNOSIS — M109 Gout, unspecified: Secondary | ICD-10-CM | POA: Diagnosis present

## 2015-02-21 DIAGNOSIS — N10 Acute tubulo-interstitial nephritis: Secondary | ICD-10-CM | POA: Diagnosis present

## 2015-02-21 DIAGNOSIS — Z87891 Personal history of nicotine dependence: Secondary | ICD-10-CM | POA: Diagnosis not present

## 2015-02-21 DIAGNOSIS — E063 Autoimmune thyroiditis: Secondary | ICD-10-CM

## 2015-02-21 DIAGNOSIS — Z9981 Dependence on supplemental oxygen: Secondary | ICD-10-CM | POA: Diagnosis not present

## 2015-02-21 DIAGNOSIS — F418 Other specified anxiety disorders: Secondary | ICD-10-CM | POA: Diagnosis not present

## 2015-02-21 DIAGNOSIS — J9611 Chronic respiratory failure with hypoxia: Secondary | ICD-10-CM | POA: Diagnosis not present

## 2015-02-21 LAB — BASIC METABOLIC PANEL
Anion gap: 6 (ref 5–15)
BUN: <5 mg/dL — ABNORMAL LOW (ref 6–20)
CO2: 26 mmol/L (ref 22–32)
Calcium: 8.9 mg/dL (ref 8.9–10.3)
Chloride: 103 mmol/L (ref 101–111)
Creatinine, Ser: 0.62 mg/dL (ref 0.44–1.00)
GFR calc Af Amer: >60 mL/min (ref >60)
GFR calc non Af Amer: >60 mL/min (ref >60)
Glucose, Bld: 111 mg/dL — ABNORMAL HIGH (ref 65–99)
Potassium: 3.5 mmol/L (ref 3.5–5.1)
Sodium: 135 mmol/L (ref 135–145)

## 2015-02-21 LAB — CREATININE, SERUM
Creatinine, Ser: 0.73 mg/dL (ref 0.44–1.00)
GFR calc Af Amer: >60 mL/min (ref >60)
GFR calc non Af Amer: >60 mL/min (ref >60)

## 2015-02-21 LAB — CBC
HCT: 35.7 % — ABNORMAL LOW (ref 36.0–46.0)
Hemoglobin: 11.2 g/dL — ABNORMAL LOW (ref 12.0–15.0)
MCH: 26.4 pg (ref 26.0–34.0)
MCHC: 31.4 g/dL (ref 30.0–36.0)
MCV: 84.2 fL (ref 78.0–100.0)
Platelets: 233 10*3/uL (ref 150–400)
RBC: 4.24 MIL/uL (ref 3.87–5.11)
RDW: 16 % — ABNORMAL HIGH (ref 11.5–15.5)
WBC: 5.9 10*3/uL (ref 4.0–10.5)

## 2015-02-21 MED ORDER — VALACYCLOVIR HCL 500 MG PO TABS
1000.0000 mg | ORAL_TABLET | Freq: Three times a day (TID) | ORAL | Status: DC
  Administered 2015-02-21 – 2015-02-22 (×4): 1000 mg via ORAL
  Filled 2015-02-21 (×5): qty 2

## 2015-02-21 NOTE — Progress Notes (Signed)
Patient arrived on unit via wheelchair from 6N.  No family at bedside.

## 2015-02-21 NOTE — Progress Notes (Signed)
Patient Demographics  Priscilla Cantrell, is a 80 y.o. female, DOB - July 19, 1935, [id number]  Admit date - 02/20/2015   Admitting Physician Waldemar Dickens, MD  Outpatient Primary MD for the patient is [name], MD  LOS -    Chief Complaint  Patient presents with  . Back Pain  . Dysuria       Admission HPI/Brief narrative: 80 y.o. female with a past medical history significant for COPD on home O2 since pneumonia in Dec 2016, HTN, low back pain, hypothyroidism, and depression who presents with flank pain. Workup was significant for UTI, but patient with no evidence of CVA tenderness, as well patient noticed to have shingles and left abdominal area.  Subjective:   Priscilla Cantrell today has, No headache, No chest pain, No abdominal pain - No Nausea, No new weakness tingling or numbness, No Cough - SOB.  Assessment & Plan    Principal Problem:   Acute pyelonephritis Active Problems:   Hypothyroidism due to Hashimoto's thyroiditis   Depression with anxiety   Essential hypertension   Chronic hypoxemic respiratory failure (HCC)   Anemia, iron deficiency  UTI - Continue with levofloxacin, follow on urine cultures.  Shingles - We'll start on Valtrex  Chronic respiratory failure - At baseline, 2 L nasal cannula bedtime and as needed  COPD - No wheezing, continue with oxygen as needed, continue home meds including Advair, Spiriva, and roflimulast.  Ankle swelling/redness - Does not appear to be cellulitis  Anemia:  Stable. - Continue home iron  Renal mass - Patient aware.  - Follow up MRI with and without contrast as outpatient  Depression/anxiety - Contineu home venlafaxine  HTN - Has been off antihypertensives lately because of hypotension. - Cotninue to hold home amlodipine   Hypothyroidism - Continue home levothyroxine   Code Status: stable  Family  Communication: Discussed with patient  Disposition Plan: home   Procedures  none   Consults   none   Medications  Scheduled Meds: . aspirin  325 mg Oral Daily  . enoxaparin (LOVENOX) injection  40 mg Subcutaneous Q24H  . ferrous fumarate  1 tablet Oral Daily  . levofloxacin (LEVAQUIN) IV  750 mg Intravenous Q24H  . levothyroxine  88 mcg Oral QAC breakfast  . mometasone-formoterol  2 puff Inhalation BID  . pantoprazole  40 mg Oral Daily  . roflumilast  500 mcg Oral Daily  . tiotropium  18 mcg Inhalation Daily  . valACYclovir  1,000 mg Oral TID  . venlafaxine  50 mg Oral BID   Continuous Infusions: . sodium chloride 75 mL/hr at 02/21/15 1328   PRN Meds:.acetaminophen **OR** acetaminophen, albuterol, HYDROcodone-acetaminophen, HYDROmorphone (DILAUDID) injection, ondansetron **OR** ondansetron (ZOFRAN) IV, senna-docusate  DVT Prophylaxis  Lovenox   Lab Results  Component Value Date   PLT 233 02/21/2015    Antibiotics    Anti-infectives    Start     Dose/Rate Route Frequency Ordered Stop   02/21/15 1600  valACYclovir (VALTREX) tablet 1,000 mg     1,000 mg Oral 3 times daily 02/21/15 1136     02/21/15 1000  levofloxacin (LEVAQUIN) IVPB 750 mg     750 mg 100 mL/hr over 90 Minutes Intravenous Every 24 hours 02/20/15 2156  02/20/15 1230  vancomycin (VANCOCIN) IVPB 1000 mg/200 mL premix     1,000 mg 200 mL/hr over 60 Minutes Intravenous  Once 02/20/15 1218 02/20/15 1625   02/20/15 1100  levofloxacin (LEVAQUIN) IVPB 750 mg     750 mg 100 mL/hr over 90 Minutes Intravenous  Once 02/20/15 1047 02/20/15 1345          Objective:   Filed Vitals:   02/20/15 2044 02/20/15 2104 02/20/15 2359 02/21/15 0654  BP: 184/72 171/63  125/57  Pulse: 71 68  70  Temp: 98.7 F (37.1 C)   98.4 F (36.9 C)  TempSrc: Oral     Resp: 19   18  Height: 5\' 1"  (1.549 m)     Weight: 68.3 kg (150 lb 9.2 oz)     SpO2: 93%  93% 94%    Wt Readings from Last 3 Encounters:  02/20/15  68.3 kg (150 lb 9.2 oz)  02/19/15 62.596 kg (138 lb)  02/16/15 65.205 kg (143 lb 12 oz)     Intake/Output Summary (Last 24 hours) at 02/21/15 1528 Last data filed at 02/21/15 0900  Gross per 24 hour  Intake 1182.5 ml  Output   2775 ml  Net -1592.5 ml     Physical Exam  Awake Alert, Oriented X 3, No new F.N deficits, Normal affect [place].AT,PERRAL Supple Neck,No JVD, No cervical lymphadenopathy appriciated.  Symmetrical Chest wall movement, Good air movement bilaterally, CTAB RRR,No Gallops,Rubs or new Murmurs, No Parasternal Heave +ve B.Sounds, Abd Soft, No tenderness, No organomegaly appriciated, patient with area of linear macular blistering erythema in right abdomen. No Cyanosis, Clubbing or edema, No new Rash or bruise     Data Review   Micro Results Recent Results (from the past 240 hour(s))  Blood culture (routine x 2)     Status: None (Preliminary result)   Collection Time: 02/20/15 11:15 AM  Result Value Ref Range Status   Specimen Description BLOOD LEFT AC  Final   Special Requests BOTTLES DRAWN AEROBIC AND ANAEROBIC 5CC EACH  Final   Culture   Final    NO GROWTH < 24 HOURS Performed at [facility]    Report Status PENDING  Incomplete  Blood culture (routine x 2)     Status: None (Preliminary result)   Collection Time: 02/20/15 11:20 AM  Result Value Ref Range Status   Specimen Description BLOOD LEFT AC  Final   Special Requests BOTTLES DRAWN AEROBIC AND ANAEROBIC 5CC EACH  Final   Culture   Final    NO GROWTH < 24 HOURS Performed at [facility]    Report Status PENDING  Incomplete    Radiology Reports Dg Chest 2 View  02/20/2015  CLINICAL DATA:  Right flank pain since yesterday.  Hypoxia. EXAM: CHEST  2 VIEW COMPARISON:  02/08/2015 FINDINGS: There is mild bilateral chronic interstitial thickening. There is no focal parenchymal opacity. There is no pleural effusion or pneumothorax. The heart and mediastinal contours are unremarkable. The  osseous structures are unremarkable. IMPRESSION: No active cardiopulmonary disease. Electronically Signed   By: Kathreen Devoid   On: 02/20/2015 11:07   Dg Chest 2 View  02/08/2015  CLINICAL DATA:  Follow-up of pneumonia, currently no chest complaints, history of COPD, hypertension, and previous heavy tobacco use. EXAM: CHEST  2 VIEW COMPARISON:  Portable chest x-ray of January 26, 2015 FINDINGS: The lungs are adequately inflated. There is no focal infiltrate. There are coarse lung markings at both lung bases which likely  reflect scarring. There is no alveolar infiltrate. There is no pleural effusion. The heart and pulmonary vascularity are normal. There is mild stable tortuosity of the descending thoracic aorta. The bony thorax exhibits no acute abnormality. IMPRESSION: There is no evidence of pneumonia. There is stable bibasilar scarring and stable changes of COPD. Electronically Signed   By: David  Martinique M.D.   On: 02/08/2015 10:21   Ct Abdomen Pelvis W Contrast  02/19/2015  CLINICAL DATA:  Right flank and right lower quadrant pain for 1 day. Prior cholecystectomy, appendectomy and hysterectomy. EXAM: CT ABDOMEN AND PELVIS WITH CONTRAST TECHNIQUE: Multidetector CT imaging of the abdomen and pelvis was performed using the standard protocol following bolus administration of intravenous contrast. CONTRAST:  23mL OMNIPAQUE IOHEXOL 300 MG/ML SOLN, 143mL OMNIPAQUE IOHEXOL 300 MG/ML SOLN COMPARISON:  No prior CT abdomen/ pelvis.  06/18/2010 coronary CT. FINDINGS: Lower chest: Right lower lobe 3 mm pulmonary nodule (series 3/ image 1) appears stable since 06/18/2010 and benign. Oral contrast is seen in the lower thoracic esophagus. Hepatobiliary: Simple 0.8 cm liver cyst in the anterior segment 4A left liver lobe. Otherwise normal liver. Cholecystectomy. No intrahepatic biliary ductal dilatation. Common bile duct diameter of 9 mm is within expected post cholecystectomy limits. No radiopaque choledocholithiasis.  Pancreas: Normal, with no mass or duct dilation. Spleen: Normal size. No mass. Adrenals/Urinary Tract: Normal adrenals. No hydronephrosis. There is a 1.1 cm renal mass in the lateral lower right kidney (series 7/ image 21), which is minimally hypodense to the renal parenchyma. Bladder visualization is limited by streak artifact from the right hip hardware. Minimal gas in the nondependent bladder lumen. No bladder wall thickening. Stomach/Bowel: Grossly normal stomach. Normal caliber small bowel with no small bowel wall thickening. Status post appendectomy. Mild sigmoid diverticulosis, with no large bowel wall thickening or pericolonic fat stranding. There undigested pills throughout the colon. Vascular/Lymphatic: Atherosclerotic nonaneurysmal abdominal aorta. Patent portal, splenic, hepatic and renal veins. No pathologically enlarged lymph nodes in the abdomen or pelvis. Reproductive: Status post hysterectomy, with no abnormal findings at the vaginal cuff. No adnexal mass. Other: No pneumoperitoneum, ascites or focal fluid collection. Musculoskeletal: No aggressive appearing focal osseous lesions. Marked degenerative disc disease in the mid to lower lumbar spine. Status post right total hip arthroplasty, with no evidence of hardware fracture or loosening in the visualized portions of the right hip hardware. Severe left hip osteoarthritis. IMPRESSION: 1. No evidence of bowel obstruction or acute bowel inflammation. Mild sigmoid diverticulosis, with no evidence of acute diverticulitis. 2. No hydronephrosis. 3. Indeterminate 1.1 cm renal mass in the lower right kidney. Recommend further evaluation with MRI abdomen with and without intravenous contrast. 4. Oral contrast in the lower thoracic esophagus, suggesting esophageal dysmotility and/or gastroesophageal reflux. Electronically Signed   By: Ilona Sorrel M.D.   On: 02/19/2015 13:20   Dg Chest Port 1 View  01/26/2015  CLINICAL DATA:  Shortness of Breath EXAM:  PORTABLE CHEST 1 VIEW COMPARISON:  January 24, 2015 FINDINGS: There is slight atelectasis in the right base. Lungs elsewhere clear. Heart is upper normal in size with pulmonary vascularity within normal limits. There is atherosclerotic calcification in the aorta. No adenopathy. No bone lesions. IMPRESSION: Slight right base atelectasis. No edema or consolidation. No change in cardiac silhouette. Electronically Signed   By: Lowella Grip III M.D.   On: 01/26/2015 07:56   Dg Chest Portable 1 View  01/24/2015  CLINICAL DATA:  Productive cough, shortness of breath, nausea, low-grade fever. EXAM: PORTABLE CHEST 1  VIEW COMPARISON:  11/16/2014 FINDINGS: Increasing opacity at the right lung base concerning for possible pneumonia. Left lung is clear. Underlying hyperinflation/COPD. Heart is normal size. No effusions or acute bony abnormality. IMPRESSION: COPD/hyperinflation. Increasing right basilar opacity concerning for pneumonia. Electronically Signed   By: Rolm Baptise M.D.   On: 01/24/2015 12:33     CBC  Recent Labs Lab 02/19/15 1120 02/20/15 1115 02/20/15 2331 02/21/15 0527  WBC 8.5 9.0 7.1 5.9  HGB 12.4 11.6* 11.3* 11.2*  HCT 39.7 37.8 36.2 35.7*  PLT 273 254 208 233  MCV 83.2 84.6 85.0 84.2  MCH 26.0 26.0 26.5 26.4  MCHC 31.2 30.7 31.2 31.4  RDW 16.6* 16.7* 16.3* 16.0*  LYMPHSABS 1.0 0.9  --   --   MONOABS 0.6 0.7  --   --   EOSABS 0.2 0.1  --   --   BASOSABS 0.0 0.0  --   --     Chemistries   Recent Labs Lab 02/19/15 1120 02/20/15 1115 02/20/15 2331 02/21/15 0527  NA 141 139  --  135  K 4.0 4.0  --  3.5  CL 103 105  --  103  CO2 26 28  --  26  GLUCOSE 113* 120*  --  111*  BUN 10 13  --  <5*  CREATININE 0.76 0.83 0.73 0.62  CALCIUM 9.5 9.1  --  8.9  AST 16 15  --   --   ALT 14 13*  --   --   ALKPHOS 77 69  --   --   BILITOT 0.4 0.2*  --   --     ------------------------------------------------------------------------------------------------------------------ estimated creatinine clearance is 52.1 mL/min (by C-G formula based on Cr of 0.62). ------------------------------------------------------------------------------------------------------------------ No results for input(s): HGBA1C in the last 72 hours. ------------------------------------------------------------------------------------------------------------------ No results for input(s): CHOL, HDL, LDLCALC, TRIG, CHOLHDL, LDLDIRECT in the last 72 hours. ------------------------------------------------------------------------------------------------------------------ No results for input(s): TSH, T4TOTAL, T3FREE, THYROIDAB in the last 72 hours.  Invalid input(s): FREET3 ------------------------------------------------------------------------------------------------------------------ No results for input(s): VITAMINB12, FOLATE, FERRITIN, TIBC, IRON, RETICCTPCT in the last 72 hours.  Coagulation profile No results for input(s): INR, PROTIME in the last 168 hours.  No results for input(s): DDIMER in the last 72 hours.  Cardiac Enzymes No results for input(s): CKMB, TROPONINI, MYOGLOBIN in the last 168 hours.  Invalid input(s): CK ------------------------------------------------------------------------------------------------------------------ Invalid input(s): POCBNP     Time Spent in minutes   30 minutes   Trashawn Oquendo M.D on 02/21/2015 at 3:28 PM  Between 7am to 7pm - Pager - [phone]  After 7pm go to [email or web address] - password [facility]  [facility]   Office  [phone]

## 2015-02-22 ENCOUNTER — Ambulatory Visit: Payer: Medicare Other | Admitting: Family Medicine

## 2015-02-22 DIAGNOSIS — J9611 Chronic respiratory failure with hypoxia: Secondary | ICD-10-CM

## 2015-02-22 DIAGNOSIS — R319 Hematuria, unspecified: Secondary | ICD-10-CM

## 2015-02-22 DIAGNOSIS — B029 Zoster without complications: Secondary | ICD-10-CM

## 2015-02-22 LAB — BASIC METABOLIC PANEL
Anion gap: 6 (ref 5–15)
BUN: 5 mg/dL — ABNORMAL LOW (ref 6–20)
CO2: 25 mmol/L (ref 22–32)
Calcium: 8.5 mg/dL — ABNORMAL LOW (ref 8.9–10.3)
Chloride: 108 mmol/L (ref 101–111)
Creatinine, Ser: 0.63 mg/dL (ref 0.44–1.00)
GFR calc Af Amer: >60 mL/min (ref >60)
GFR calc non Af Amer: >60 mL/min (ref >60)
Glucose, Bld: 97 mg/dL (ref 65–99)
Potassium: 3.7 mmol/L (ref 3.5–5.1)
Sodium: 139 mmol/L (ref 135–145)

## 2015-02-22 LAB — CBC
HCT: 36.2 % (ref 36.0–46.0)
Hemoglobin: 11.3 g/dL — ABNORMAL LOW (ref 12.0–15.0)
MCH: 26.3 pg (ref 26.0–34.0)
MCHC: 31.2 g/dL (ref 30.0–36.0)
MCV: 84.2 fL (ref 78.0–100.0)
Platelets: 217 10*3/uL (ref 150–400)
RBC: 4.3 MIL/uL (ref 3.87–5.11)
RDW: 16.1 % — ABNORMAL HIGH (ref 11.5–15.5)
WBC: 4.8 10*3/uL (ref 4.0–10.5)

## 2015-02-22 MED ORDER — HYDROCODONE-ACETAMINOPHEN 5-325 MG PO TABS: 1.0000 | ORAL_TABLET | Freq: Four times a day (QID) | ORAL | Status: DC | PRN

## 2015-02-22 MED ORDER — PREGABALIN 25 MG PO CAPS: 25.0000 mg | ORAL_CAPSULE | Freq: Three times a day (TID) | ORAL | Status: DC

## 2015-02-22 MED ORDER — PREGABALIN 25 MG PO CAPS
25.0000 mg | ORAL_CAPSULE | Freq: Three times a day (TID) | ORAL | Status: DC
  Administered 2015-02-22 (×2): 25 mg via ORAL
  Filled 2015-02-22 (×2): qty 1

## 2015-02-22 MED ORDER — LEVOFLOXACIN 750 MG PO TABS: 750.0000 mg | ORAL_TABLET | Freq: Every day | ORAL | Status: DC

## 2015-02-22 MED ORDER — VALACYCLOVIR HCL 1 G PO TABS: 1000.0000 mg | ORAL_TABLET | Freq: Three times a day (TID) | ORAL | Status: DC

## 2015-02-22 MED ORDER — LEVOFLOXACIN 750 MG PO TABS: 750.0000 mg | ORAL_TABLET | Freq: Every day | ORAL | Status: AC

## 2015-02-22 NOTE — Evaluation (Signed)
Physical Therapy Evaluation Patient Details Name: Priscilla Cantrell MRN: [id number] DOB: 10/19/1935 Today's Date: 02/22/2015   History of Present Illness  pt is a 80 y/o female with COPD on home O2, DDD/stenosis cervical and lumbar spine, recent R THA and recent d/c from hospital with PNA, admitted with R flank pain.  Found to have shingles and UTI.  Clinical Impression  Pt is at or close to baseline functioning and should be safe at home.  Her mild instabilities and mild activity intolerances can be dealt with in her home environment. There are no further acute PT needs.  Will sign off at this time.     Follow Up Recommendations No PT follow up;Supervision - Intermittent    Equipment Recommendations  None recommended by PT    Recommendations for Other Services       Precautions / Restrictions Precautions Precautions: None      Mobility  Bed Mobility Overal bed mobility: Modified Independent                Transfers Overall transfer level: Modified independent                  Ambulation/Gait Ambulation/Gait assistance: Supervision;Independent Ambulation Distance (Feet): 120 Feet Assistive device: None Gait Pattern/deviations: Step-through pattern   Gait velocity interpretation: at or above normal speed for age/gender General Gait Details: Steady overall.  Independent in close environment of her room.  Stairs            Wheelchair Mobility    Modified Rankin (Stroke Patients Only)       Balance                                             Pertinent Vitals/Pain Pain Assessment: Faces Faces Pain Scale: Hurts a little bit Pain Location: flank Pain Descriptors / Indicators: Discomfort Pain Intervention(s): Monitored during session    Home Living Family/patient expects to be discharged to:: Private residence Living Arrangements: Alone Available Help at Discharge: Family;Available PRN/intermittently Type of Home:  House Home Access: Stairs to enter Entrance Stairs-Rails: None Entrance Stairs-Number of Steps: 2 Home Layout: Two level;Able to live on main level with bedroom/bathroom Home Equipment: Gilford Rile - 2 wheels;Cane - quad;Tub bench;Grab bars - tub/shower      Prior Function Level of Independence: Independent with assistive device(s);Independent         Comments: using cane at home and occasionally no device     Hand Dominance   Dominant Hand: Right    Extremity/Trunk Assessment   Upper Extremity Assessment: Overall WFL for tasks assessed           Lower Extremity Assessment: Overall WFL for tasks assessed         Communication   Communication: No difficulties  Cognition Arousal/Alertness: Awake/alert Behavior During Therapy: WFL for tasks assessed/performed Overall Cognitive Status: Within Functional Limits for tasks assessed                      General Comments General comments (skin integrity, edema, etc.): During ambulation, SpO2 on RA dropped into the 70's with HR in the 90's, on 3L [place] , she dropped to 89%. Discussed continued use of the oxygen at home.    Exercises        Assessment/Plan    PT Assessment Patent does not need any further PT services  PT Diagnosis Abnormality of gait;Other (comment) (activity tolerance)   PT Problem List    PT Treatment Interventions     PT Goals (Current goals can be found in the Care Plan section) Acute Rehab PT Goals Patient Stated Goal: To get home PT Goal Formulation: With patient    Frequency     Barriers to discharge        Co-evaluation               End of Session   Activity Tolerance: Patient tolerated treatment well Patient left: in bed;with call bell/phone within reach Nurse Communication: Mobility status         Time: [id number] PT Time Calculation (min) (ACUTE ONLY): 36 min   Charges:   PT Evaluation $PT Eval Moderate Complexity: 1 Procedure PT Treatments $Gait Training:  8-22 mins   PT G Codes:        Priscilla Cantrell, Priscilla Cantrell 02/22/2015, 3:57 PM   02/22/2015  Priscilla Cantrell, [place] [phone]  (pager)

## 2015-02-22 NOTE — Discharge Summary (Signed)
Priscilla Cantrell, is a 80 y.o. female  DOB 1935-02-10  MRN [id number].  Admission date:  02/20/2015  Admitting Physician  Waldemar Dickens, MD  Discharge Date:  02/22/2015   Primary MD  [name], MD  Recommendations for primary care physician for things to follow:  -check CBC, BMP to your next visit. - Titrate Lyrica as needed for shingles pain. - Patient will need MRI abdomen with and without contrast to follow on the renal mass   Admission Diagnosis  UTI (lower urinary tract infection) [N39.0] Right flank pain [R10.9] Cellulitis of left lower extremity [L03.116]   Discharge Diagnosis  UTI (lower urinary tract infection) [N39.0] Right flank pain [R10.9] Cellulitis of left lower extremity [L03.116]    Active Problems:   Hypothyroidism due to Hashimoto's thyroiditis   Depression with anxiety   UTI (urinary tract infection)   Essential hypertension   Chronic hypoxemic respiratory failure (HCC)   Shingles      Past Medical History  Diagnosis Date  . Hypertension     EKG 03/2010 normal  . COPD (chronic obstructive pulmonary disease) ([place])     spirometry 01/10/09 FEV 0.97(52%), FEV1% 47  . Hypoxemia     O2 86% RA with exertion 01/10/09, 2 liters oxygen with exertion and sleep  . Pulmonary nodule     CT chest 12/10, June 2011.  No nodule on CT 06/2010.  Marland Kitchen Spinal stenosis     Symptomatic (neurogenic claudication)  . GERD (gastroesophageal reflux disease)   . Hypothyroidism     Hashimoto's, dx'd 1989  . Diverticulosis of colon     Procto '97 and colonoscopy 2002  . DDD (degenerative disc disease), cervical     1998 MRI C5-6 impingemt (left)  . DDD (degenerative disc disease), lumbar   . Spinal stenosis of lumbar region with neurogenic claudication     2008 MRI (L3-4, L4-5, L5-S1)  . Fibrocystic breast disease     w/fibroadenoma.  Bx's showed NO atypia (Dr. Margot Chimes).  Mammo neg 2008.  Marland Kitchen  Chest pain, non-cardiac     Cardiac CT showed no signif obstructive dz, mildly elevated calcium level (Dr. [name]).  . History of double vision     Ophthalmologist, Dr. Herbert Deaner, is further evaluating this with MRI  orbits and limited brain (myesthenia gravis testing neg 07/2010)  . Depression with anxiety 04/15/2011  . Asthma   . Dysplastic nevus of upper extremity 04/2014    R tricep ([name])  . Osteoarthritis, hip, bilateral     End stage; R THA  . History of home oxygen therapy     uses 2 liters at hs  . Family history of adverse reaction to anesthesia     mother died when patient age 18 receiving anesthesia for thyroid surgery  . Chronic hypoxemic respiratory failure ([place]) 02/02/2015    2L oxygen 24/7    Past Surgical History  Procedure Laterality Date  . Cholecystectomy  2001  . Tubal ligation  1974  . Abdominal hysterectomy  1978  no BSO per pt--nonmalignant reasons  . Breast cyst excision      Last screening mammogram 10/2010 was normal.  . Appendectomy    . Tonsillectomy    . Colonoscopy  04/2005    NORMAL-recall 10 yrs  . Transthoracic echocardiogram  08/2014    EF 60-65%, grade I diast dysfxn  . Total hip arthroplasty Right 11/01/2014    Procedure: RIGHT TOTAL HIP ARTHROPLASTY;  Surgeon: Latanya Maudlin, MD;  Location: [facility];  Service: Orthopedics;  Laterality: Right;       History of present illness and  Hospital Course:     Kindly see H&P for history of present illness and admission details, please review complete Labs, Consult reports and Test reports for all details in brief  HPI  from the history and physical done on the day of admission 02/20/2015 Tessla Goldson Pureco is a 80 y.o. female with a past medical history significant for COPD on home O2 since pneumonia in Dec 2016, HTN, low back pain, hypothyroidism, and depression who presents with flank pain.  The patient has been packet home after discharge from the hospital for pneumonia 1 month ago recuperating.  She seemed to be getting better until the last few days when she started to develop right-sided flank pain. The pain is constant, stabbing and aching, located in the right flank and radiating to the front. Also associated with hematuria and dysuria. The pain was bad enough and associated with shaking chills and so she came to the ER yesterday where she was diagnosed with shingles and sent home with hydrocodone and ondansetron. This helped last night, but today the pain was back and more severe so she came to the ER.  In the ED, she was tachycardic and hypoxic but afebrile. Urinalysis showed full field RBCs, WBCs, and bacteria.  Of note there is a question of some ankle redness and swelling from a couple weeks ago that seemed to be getting better. This was evidently very red and patient first presented to the emergency room, there was concern for cellulitis. The patient's clear to me that this red spot has been getting better. She does have a history of gout.   Hospital Course  80 y.o. female with a past medical history significant for COPD on home O2 since pneumonia in Dec 2016, HTN, low back pain, hypothyroidism, and depression who presents with flank pain. Workup was significant for UTI, but patient with no evidence of CVA tenderness, as well patient noticed to have shingles and left abdominal area.  UTI - treated with IV levofloxacin X2 days in the hospital, urine culture growing enterococcus species, responding to levofloxacin, finished another day of by mouth as an outpatient - No CVA tenderness, unlikely pyelonephritis  shingles - will treat with Valtrex total of 7 days, started on Lyrica for neuropathic pain   chronic respiratory failure - At baseline, 2 L nasal cannula bedtime and as needed  COPD - No wheezing, continue with oxygen as needed, continue home meds including Advair, Spiriva, and roflimulast.  Ankle swelling/redness - Does not appear to be cellulitis  Anemia:   Stable. - Continue home iron  Renal mass - Patient is aware.  - Follow up MRI with and without contrast as outpatient  Depression/anxiety - Contineu home venlafaxine  HTN - Has been off antihypertensives lately because of hypotension. - Cotninue to hold home amlodipine   Hypothyroidism - Continue home levothyroxine   Discharge Condition:  stable   Follow UP  Follow-up Information  Follow up with MCGOWEN,PHILIP H, MD. Schedule an appointment as soon as possible for a visit in 1 week.   Specialty:  Family Medicine   Why:  Posthospitalization follow-up   Contact information:   [address] [place] [address] [phone]         Discharge Instructions  and  Discharge Medications         Discharge Instructions    Diet - low sodium heart healthy    Complete by:  As directed      Discharge instructions    Complete by:  As directed   Follow with Primary MD [name], MD in 7 days   Get CBC, CMP, checked  by Primary MD next visit.    Activity: As tolerated with Full fall precautions use walker/cane & assistance as needed   Disposition Home    Diet: Heart Healthy  , with feeding assistance and aspiration precautions.  For Heart failure patients - Check your Weight same time everyday, if you gain over 2 pounds, or you develop in leg swelling, experience more shortness of breath or chest pain, call your Primary MD immediately. Follow Cardiac Low Salt Diet and 1.5 lit/day fluid restriction.   On your next visit with your primary care physician please Get Medicines reviewed and adjusted.   Please request your Prim.MD to go over all Hospital Tests and Procedure/Radiological results at the follow up, please get all Hospital records sent to your Prim MD by signing hospital release before you go home.   If you experience worsening of your admission symptoms, develop shortness of breath, life threatening emergency, suicidal or homicidal  thoughts you must seek medical attention immediately by calling 911 or calling your MD immediately  if symptoms less severe.  You Must read complete instructions/literature along with all the possible adverse reactions/side effects for all the Medicines you take and that have been prescribed to you. Take any new Medicines after you have completely understood and accpet all the possible adverse reactions/side effects.   Do not drive, operating heavy machinery, perform activities at heights, swimming or participation in water activities or provide baby sitting services if your were admitted for syncope or siezures until you have seen by Primary MD or a Neurologist and advised to do so again.  Do not drive when taking Pain medications.    Do not take more than prescribed Pain, Sleep and Anxiety Medications  Special Instructions: If you have smoked or chewed Tobacco  in the last 2 yrs please stop smoking, stop any regular Alcohol  and or any Recreational drug use.  Wear Seat belts while driving.   Please note  You were cared for by a hospitalist during your hospital stay. If you have any questions about your discharge medications or the care you received while you were in the hospital after you are discharged, you can call the unit and asked to speak with the hospitalist on call if the hospitalist that took care of you is not available. Once you are discharged, your primary care physician will handle any further medical issues. Please note that NO REFILLS for any discharge medications will be authorized once you are discharged, as it is imperative that you return to your primary care physician (or establish a relationship with a primary care physician if you do not have one) for your aftercare needs so that they can reassess your need for medications and monitor your lab values.     Increase activity slowly  Complete by:  As directed             Medication List    TAKE these medications         ADVAIR DISKUS 250-50 MCG/DOSE Aepb  Generic drug:  Fluticasone-Salmeterol  INHALE 1 PUFF INTO THE LUNGS TWICE DAILY     albuterol 108 (90 Base) MCG/ACT inhaler  Commonly known as:  PROAIR HFA  Inhale 2 puffs into the lungs every 4 (four) hours as needed.     aspirin 325 MG tablet  Take 325 mg by mouth daily.     cetirizine 10 MG tablet  Commonly known as:  ZYRTEC  Take 1 tablet (10 mg total) by mouth daily.     DALIRESP 500 MCG Tabs tablet  Generic drug:  roflumilast  TAKE 1 TABLET BY MOUTH DAILY     diazepam 5 MG tablet  Commonly known as:  VALIUM  Take 1 tablet (5 mg total) by mouth every 12 (twelve) hours as needed.     HYDROcodone-acetaminophen 5-325 MG tablet  Commonly known as:  NORCO/VICODIN  Take 1-2 tablets by mouth every 6 (six) hours as needed.     levofloxacin 750 MG tablet  Commonly known as:  LEVAQUIN  Take 1 tablet (750 mg total) by mouth daily. These take 1 tablet then stop  Start taking on:  02/23/2015     omeprazole 20 MG capsule  Commonly known as:  PRILOSEC  Take 20 mg by mouth daily.     ondansetron 4 MG tablet  Commonly known as:  ZOFRAN  Take 1 tablet (4 mg total) by mouth every 6 (six) hours.     pregabalin 25 MG capsule  Commonly known as:  LYRICA  Take 1 capsule (25 mg total) by mouth 3 (three) times daily.     RESTASIS 0.05 % ophthalmic emulsion  Generic drug:  cycloSPORINE  Apply 1 drop to eye 2 (two) times daily.     SPIRIVA HANDIHALER 18 MCG inhalation capsule  Generic drug:  tiotropium  INHALE 1 CAPSULE VIA HANDIHALER EVERY DAY AT THE SAME TIME     SYNTHROID 88 MCG tablet  Generic drug:  levothyroxine  TAKE 1 TABLET BY MOUTH DAILY     valACYclovir 1000 MG tablet  Commonly known as:  VALTREX  Take 1 tablet (1,000 mg total) by mouth 3 (three) times daily.     venlafaxine 50 MG tablet  Commonly known as:  EFFEXOR  Take 1 tablet (50 mg total) by mouth 2 (two) times daily.          Diet and Activity recommendation: See  Discharge Instructions above   Consults obtained -  none   Major procedures and Radiology Reports - PLEASE review detailed and final reports for all details, in brief -      Dg Chest 2 View  02/20/2015  CLINICAL DATA:  Right flank pain since yesterday.  Hypoxia. EXAM: CHEST  2 VIEW COMPARISON:  02/08/2015 FINDINGS: There is mild bilateral chronic interstitial thickening. There is no focal parenchymal opacity. There is no pleural effusion or pneumothorax. The heart and mediastinal contours are unremarkable. The osseous structures are unremarkable. IMPRESSION: No active cardiopulmonary disease. Electronically Signed   By: Kathreen Devoid   On: 02/20/2015 11:07   Dg Chest 2 View  02/08/2015  CLINICAL DATA:  Follow-up of pneumonia, currently no chest complaints, history of COPD, hypertension, and previous heavy tobacco use. EXAM: CHEST  2 VIEW COMPARISON:  Portable chest x-ray of January 26, 2015 FINDINGS: The lungs are adequately inflated. There is no focal infiltrate. There are coarse lung markings at both lung bases which likely reflect scarring. There is no alveolar infiltrate. There is no pleural effusion. The heart and pulmonary vascularity are normal. There is mild stable tortuosity of the descending thoracic aorta. The bony thorax exhibits no acute abnormality. IMPRESSION: There is no evidence of pneumonia. There is stable bibasilar scarring and stable changes of COPD. Electronically Signed   By: David  Martinique M.D.   On: 02/08/2015 10:21   Ct Abdomen Pelvis W Contrast  02/19/2015  CLINICAL DATA:  Right flank and right lower quadrant pain for 1 day. Prior cholecystectomy, appendectomy and hysterectomy. EXAM: CT ABDOMEN AND PELVIS WITH CONTRAST TECHNIQUE: Multidetector CT imaging of the abdomen and pelvis was performed using the standard protocol following bolus administration of intravenous contrast. CONTRAST:  37mL OMNIPAQUE IOHEXOL 300 MG/ML SOLN, 145mL OMNIPAQUE IOHEXOL 300 MG/ML SOLN  COMPARISON:  No prior CT abdomen/ pelvis.  06/18/2010 coronary CT. FINDINGS: Lower chest: Right lower lobe 3 mm pulmonary nodule (series 3/ image 1) appears stable since 06/18/2010 and benign. Oral contrast is seen in the lower thoracic esophagus. Hepatobiliary: Simple 0.8 cm liver cyst in the anterior segment 4A left liver lobe. Otherwise normal liver. Cholecystectomy. No intrahepatic biliary ductal dilatation. Common bile duct diameter of 9 mm is within expected post cholecystectomy limits. No radiopaque choledocholithiasis. Pancreas: Normal, with no mass or duct dilation. Spleen: Normal size. No mass. Adrenals/Urinary Tract: Normal adrenals. No hydronephrosis. There is a 1.1 cm renal mass in the lateral lower right kidney (series 7/ image 21), which is minimally hypodense to the renal parenchyma. Bladder visualization is limited by streak artifact from the right hip hardware. Minimal gas in the nondependent bladder lumen. No bladder wall thickening. Stomach/Bowel: Grossly normal stomach. Normal caliber small bowel with no small bowel wall thickening. Status post appendectomy. Mild sigmoid diverticulosis, with no large bowel wall thickening or pericolonic fat stranding. There undigested pills throughout the colon. Vascular/Lymphatic: Atherosclerotic nonaneurysmal abdominal aorta. Patent portal, splenic, hepatic and renal veins. No pathologically enlarged lymph nodes in the abdomen or pelvis. Reproductive: Status post hysterectomy, with no abnormal findings at the vaginal cuff. No adnexal mass. Other: No pneumoperitoneum, ascites or focal fluid collection. Musculoskeletal: No aggressive appearing focal osseous lesions. Marked degenerative disc disease in the mid to lower lumbar spine. Status post right total hip arthroplasty, with no evidence of hardware fracture or loosening in the visualized portions of the right hip hardware. Severe left hip osteoarthritis. IMPRESSION: 1. No evidence of bowel obstruction or  acute bowel inflammation. Mild sigmoid diverticulosis, with no evidence of acute diverticulitis. 2. No hydronephrosis. 3. Indeterminate 1.1 cm renal mass in the lower right kidney. Recommend further evaluation with MRI abdomen with and without intravenous contrast. 4. Oral contrast in the lower thoracic esophagus, suggesting esophageal dysmotility and/or gastroesophageal reflux. Electronically Signed   By: Ilona Sorrel M.D.   On: 02/19/2015 13:20   Dg Chest Port 1 View  01/26/2015  CLINICAL DATA:  Shortness of Breath EXAM: PORTABLE CHEST 1 VIEW COMPARISON:  January 24, 2015 FINDINGS: There is slight atelectasis in the right base. Lungs elsewhere clear. Heart is upper normal in size with pulmonary vascularity within normal limits. There is atherosclerotic calcification in the aorta. No adenopathy. No bone lesions. IMPRESSION: Slight right base atelectasis. No edema or consolidation. No change in cardiac silhouette. Electronically Signed   By: Lowella Grip III M.D.   On: 01/26/2015 07:56  Dg Chest Portable 1 View  01/24/2015  CLINICAL DATA:  Productive cough, shortness of breath, nausea, low-grade fever. EXAM: PORTABLE CHEST 1 VIEW COMPARISON:  11/16/2014 FINDINGS: Increasing opacity at the right lung base concerning for possible pneumonia. Left lung is clear. Underlying hyperinflation/COPD. Heart is normal size. No effusions or acute bony abnormality. IMPRESSION: COPD/hyperinflation. Increasing right basilar opacity concerning for pneumonia. Electronically Signed   By: Rolm Baptise M.D.   On: 01/24/2015 12:33    Micro Results     Recent Results (from the past 240 hour(s))  Culture, Urine     Status: None (Preliminary result)   Collection Time: 02/20/15 10:20 AM  Result Value Ref Range Status   Specimen Description   Final    URINE, CLEAN CATCH COLLECTED AT [facility] ED Performed at [place] Requests NONE  Final   Culture 80,000 COLONIES/ml ENTEROCOCCUS SPECIES  Final    Report Status PENDING  Incomplete  Blood culture (routine x 2)     Status: None (Preliminary result)   Collection Time: 02/20/15 11:15 AM  Result Value Ref Range Status   Specimen Description BLOOD LEFT AC  Final   Special Requests BOTTLES DRAWN AEROBIC AND ANAEROBIC 5CC EACH  Final   Culture   Final    NO GROWTH 2 DAYS Performed at [facility]    Report Status PENDING  Incomplete  Blood culture (routine x 2)     Status: None (Preliminary result)   Collection Time: 02/20/15 11:20 AM  Result Value Ref Range Status   Specimen Description BLOOD LEFT AC  Final   Special Requests BOTTLES DRAWN AEROBIC AND ANAEROBIC 5CC EACH  Final   Culture   Final    NO GROWTH 2 DAYS Performed at [facility]    Report Status PENDING  Incomplete       Today   Subjective:   Tyeasha Kueny today has no headache,no chestl pain,no new weakness tingling or numbness, feels much better today.   Objective:   Blood pressure 158/68, pulse 74, temperature 98.2 F (36.8 C), temperature source Oral, resp. rate 19, height 5\' 1"  (1.549 m), weight 63.9 kg (140 lb 14 oz), SpO2 95 %.   Intake/Output Summary (Last 24 hours) at 02/22/15 1444 Last data filed at 02/22/15 1011  Gross per 24 hour  Intake   3005 ml  Output    550 ml  Net   2455 ml    Exam  Awake Alert, Oriented X 3, No new F.N deficits, Normal affect [place].AT,PERRAL Supple Neck,No JVD, No cervical lymphadenopathy appriciated.  Symmetrical Chest wall movement, Good air movement bilaterally, CTAB RRR,No Gallops,Rubs or new Murmurs, No Parasternal Heave +ve B.Sounds, Abd Soft, No tenderness, No organomegaly appriciated, patient with area of linear macular blistering erythema in right abdomen. No Cyanosis, Clubbing or edema, No new Rash or bruise   Data Review   CBC w Diff:  Lab Results  Component Value Date   WBC 4.8 02/22/2015   HGB 11.3* 02/22/2015   HCT 36.2 02/22/2015   PLT 217 02/22/2015   LYMPHOPCT 10  02/20/2015   MONOPCT 8 02/20/2015   EOSPCT 2 02/20/2015   BASOPCT 0 02/20/2015    CMP:  Lab Results  Component Value Date   NA 139 02/22/2015   K 3.7 02/22/2015   CL 108 02/22/2015   CO2 25 02/22/2015   BUN 5* 02/22/2015   CREATININE 0.63 02/22/2015   CREATININE 0.68 06/02/2014   PROT 6.6  02/20/2015   ALBUMIN 3.5 02/20/2015   BILITOT 0.2* 02/20/2015   ALKPHOS 69 02/20/2015   AST 15 02/20/2015   ALT 13* 02/20/2015  .   Total Time in preparing paper work, data evaluation and todays exam - 35 minutes  Jandy Brackens M.D on 02/22/2015 at 2:44 PM  [facility]   Office  [phone]

## 2015-02-22 NOTE — Discharge Instructions (Signed)
Follow with Primary MD [name], MD in 7 days   Get CBC, CMP, checked  by Primary MD next visit.    Activity: As tolerated with Full fall precautions use walker/cane & assistance as needed.   Disposition Home    Diet: Heart Healthy  , encouraged to increase fluid intake  For Heart failure patients - Check your Weight same time everyday, if you gain over 2 pounds, or you develop in leg swelling, experience more shortness of breath or chest pain, call your Primary MD immediately. Follow Cardiac Low Salt Diet and 1.5 lit/day fluid restriction.   On your next visit with your primary care physician please Get Medicines reviewed and adjusted.   Please request your Prim.MD to go over all Hospital Tests and Procedure/Radiological results at the follow up, please get all Hospital records sent to your Prim MD by signing hospital release before you go home.   If you experience worsening of your admission symptoms, develop shortness of breath, life threatening emergency, suicidal or homicidal thoughts you must seek medical attention immediately by calling 911 or calling your MD immediately  if symptoms less severe.  You Must read complete instructions/literature along with all the possible adverse reactions/side effects for all the Medicines you take and that have been prescribed to you. Take any new Medicines after you have completely understood and accpet all the possible adverse reactions/side effects.   Do not drive, operating heavy machinery, perform activities at heights, swimming or participation in water activities or provide baby sitting services if your were admitted for syncope or siezures until you have seen by Primary MD or a Neurologist and advised to do so again.  Do not drive when taking Pain medications.    Do not take more than prescribed Pain, Sleep and Anxiety Medications  Special Instructions: If you have smoked or chewed Tobacco  in the last 2 yrs please stop  smoking, stop any regular Alcohol  and or any Recreational drug use.  Wear Seat belts while driving.   Please note  You were cared for by a hospitalist during your hospital stay. If you have any questions about your discharge medications or the care you received while you were in the hospital after you are discharged, you can call the unit and asked to speak with the hospitalist on call if the hospitalist that took care of you is not available. Once you are discharged, your primary care physician will handle any further medical issues. Please note that NO REFILLS for any discharge medications will be authorized once you are discharged, as it is imperative that you return to your primary care physician (or establish a relationship with a primary care physician if you do not have one) for your aftercare needs so that they can reassess your need for medications and monitor your lab values.

## 2015-02-23 ENCOUNTER — Telehealth: Payer: Self-pay | Admitting: Family Medicine

## 2015-02-23 LAB — URINE CULTURE: Culture: 80000

## 2015-02-23 NOTE — Telephone Encounter (Signed)
Transition Care Management Follow-up Telephone Call   Date discharged? 02/22/15   How have you been since you were released from the hospital? Patient just states she feels weak   Do you understand why you were in the hospital? yes   Do you understand the discharge instructions? yes   Where were you discharged to? Home   Items Reviewed:  Medications reviewed: yes  Allergies reviewed: yes  Dietary changes reviewed: no  Referrals reviewed: no   Functional Questionnaire:   Activities of Daily Living (ADLs):   She states they are independent in the following: none States they require assistance with the following: none   Any transportation issues/concerns?: no   Any patient concerns? yes   Confirmed importance and date/time of follow-up visits scheduled yes  Provider Appointment booked with Dr Anitra Lauth on 03/07/15 @ 10:15.  Confirmed with patient if condition begins to worsen call PCP or go to the ER.  Patient was given the office number and encouraged to call back with question or concerns.  : yes

## 2015-02-25 LAB — CULTURE, BLOOD (ROUTINE X 2)
Culture: NO GROWTH
Culture: NO GROWTH

## 2015-02-26 ENCOUNTER — Telehealth: Payer: Self-pay | Admitting: Family Medicine

## 2015-02-26 NOTE — Telephone Encounter (Signed)
Please advise. Thanks.  

## 2015-02-26 NOTE — Telephone Encounter (Signed)
Pt advised and voiced understanding.   

## 2015-02-26 NOTE — Telephone Encounter (Signed)
Not contagious as long as no one touches the rash. However, I still would advise her to not have close contact with any young infants or any people on chemotherapy currently---until the shingles rash is dried up and scarring over.-thx

## 2015-02-26 NOTE — Telephone Encounter (Signed)
Patient wants to know if shingles are contagious and how long are they contagious?

## 2015-02-28 ENCOUNTER — Telehealth: Payer: Self-pay | Admitting: Family Medicine

## 2015-02-28 ENCOUNTER — Encounter: Payer: Self-pay | Admitting: Family Medicine

## 2015-02-28 ENCOUNTER — Ambulatory Visit (INDEPENDENT_AMBULATORY_CARE_PROVIDER_SITE_OTHER): Payer: Medicare Other | Admitting: Family Medicine

## 2015-02-28 VITALS — BP 143/85 | HR 78 | Temp 98.0°F | Resp 20 | Wt 146.0 lb

## 2015-02-28 DIAGNOSIS — K5900 Constipation, unspecified: Secondary | ICD-10-CM | POA: Diagnosis not present

## 2015-02-28 DIAGNOSIS — Z87898 Personal history of other specified conditions: Secondary | ICD-10-CM | POA: Diagnosis not present

## 2015-02-28 DIAGNOSIS — N2889 Other specified disorders of kidney and ureter: Secondary | ICD-10-CM

## 2015-02-28 DIAGNOSIS — N390 Urinary tract infection, site not specified: Secondary | ICD-10-CM | POA: Diagnosis not present

## 2015-02-28 DIAGNOSIS — R319 Hematuria, unspecified: Secondary | ICD-10-CM

## 2015-02-28 DIAGNOSIS — Z9289 Personal history of other medical treatment: Secondary | ICD-10-CM | POA: Insufficient documentation

## 2015-02-28 DIAGNOSIS — B029 Zoster without complications: Secondary | ICD-10-CM

## 2015-02-28 DIAGNOSIS — R109 Unspecified abdominal pain: Secondary | ICD-10-CM

## 2015-02-28 LAB — COMPREHENSIVE METABOLIC PANEL
ALT: 11 U/L (ref 0–35)
AST: 13 U/L (ref 0–37)
Albumin: 3.9 g/dL (ref 3.5–5.2)
Alkaline Phosphatase: 70 U/L (ref 39–117)
BUN: 13 mg/dL (ref 6–23)
CO2: 27 mEq/L (ref 19–32)
Calcium: 9.1 mg/dL (ref 8.4–10.5)
Chloride: 103 mEq/L (ref 96–112)
Creatinine, Ser: 0.72 mg/dL (ref 0.40–1.20)
GFR: 83.3 mL/min (ref >60.00)
Glucose, Bld: 80 mg/dL (ref 70–99)
Potassium: 4.3 mEq/L (ref 3.5–5.1)
Sodium: 139 mEq/L (ref 135–145)
Total Bilirubin: 0.3 mg/dL (ref 0.2–1.2)
Total Protein: 6.4 g/dL (ref 6.0–8.3)

## 2015-02-28 LAB — CBC WITH DIFFERENTIAL/PLATELET
Basophils Absolute: 0 10*3/uL (ref 0.0–0.1)
Basophils Relative: 0.4 % (ref 0.0–3.0)
Eosinophils Absolute: 0.1 10*3/uL (ref 0.0–0.7)
Eosinophils Relative: 1.2 % (ref 0.0–5.0)
HCT: 36.8 % (ref 36.0–46.0)
Hemoglobin: 11.8 g/dL — ABNORMAL LOW (ref 12.0–15.0)
Lymphocytes Relative: 23.2 % (ref 12.0–46.0)
Lymphs Abs: 1.5 10*3/uL (ref 0.7–4.0)
MCHC: 32 g/dL (ref 30.0–36.0)
MCV: 81.8 fl (ref 78.0–100.0)
Monocytes Absolute: 0.6 10*3/uL (ref 0.1–1.0)
Monocytes Relative: 9.1 % (ref 3.0–12.0)
Neutro Abs: 4.3 10*3/uL (ref 1.4–7.7)
Neutrophils Relative %: 66.1 % (ref 43.0–77.0)
Platelets: 351 10*3/uL (ref 150.0–400.0)
RBC: 4.49 Mil/uL (ref 3.87–5.11)
RDW: 17.7 % — ABNORMAL HIGH (ref 11.5–15.5)
WBC: 6.4 10*3/uL (ref 4.0–10.5)

## 2015-02-28 MED ORDER — SENNOSIDES-DOCUSATE SODIUM 8.6-50 MG PO TABS: 2.0000 | ORAL_TABLET | Freq: Every day | ORAL | Status: DC

## 2015-02-28 NOTE — Patient Instructions (Signed)
Constipation, Adult Constipation is when a person has fewer than three bowel movements a week, has difficulty having a bowel movement, or has stools that are dry, hard, or larger than normal. As people grow older, constipation is more common. A low-fiber diet, not taking in enough fluids, and taking certain medicines may make constipation worse.  CAUSES   Certain medicines, such as antidepressants, pain medicine, iron supplements, antacids, and water pills.   Certain diseases, such as diabetes, irritable bowel syndrome (IBS), thyroid disease, or depression.   Not drinking enough water.   Not eating enough fiber-rich foods.   Stress or travel.   Lack of physical activity or exercise.   Ignoring the urge to have a bowel movement.   Using laxatives too much.  SIGNS AND SYMPTOMS   Having fewer than three bowel movements a week.   Straining to have a bowel movement.   Having stools that are hard, dry, or larger than normal.   Feeling full or bloated.   Pain in the lower abdomen.   Not feeling relief after having a bowel movement.  DIAGNOSIS  Your health care provider will take a medical history and perform a physical exam. Further testing may be done for severe constipation. Some tests may include:  A barium enema X-ray to examine your rectum, colon, and, sometimes, your small intestine.   A sigmoidoscopy to examine your lower colon.   A colonoscopy to examine your entire colon. TREATMENT  Treatment will depend on the severity of your constipation and what is causing it. Some dietary treatments include drinking more fluids and eating more fiber-rich foods. Lifestyle treatments may include regular exercise. If these diet and lifestyle recommendations do not help, your health care provider may recommend taking over-the-counter laxative medicines to help you have bowel movements. Prescription medicines may be prescribed if over-the-counter medicines do not work.   HOME CARE INSTRUCTIONS   Eat foods that have a lot of fiber, such as fruits, vegetables, whole grains, and beans.  Limit foods high in fat and processed sugars, such as french fries, hamburgers, cookies, candies, and soda.   A fiber supplement may be added to your diet if you cannot get enough fiber from foods.   Drink enough fluids to keep your urine clear or pale yellow.   Exercise regularly or as directed by your health care provider.   Go to the restroom when you have the urge to go. Do not hold it.   Only take over-the-counter or prescription medicines as directed by your health care provider. Do not take other medicines for constipation without talking to your health care provider first.  [place] IF:   You have bright red blood in your stool.   Your constipation lasts for more than 4 days or gets worse.   You have abdominal or rectal pain.   You have thin, pencil-like stools.   You have unexplained weight loss. MAKE SURE YOU:   Understand these instructions.  Will watch your condition.  Will get help right away if you are not doing well or get worse.   This information is not intended to replace advice given to you by your health care provider. Make sure you discuss any questions you have with your health care provider.   Document Released: 10/19/2003 Document Revised: 02/10/2014 Document Reviewed: 11/01/2012 Elsevier Interactive Patient Education [employer].   I have collected labs today and will call you with results once available. Make certain to drink water daily,  unless told you need to watch your fluid intake. Start Miralax (over the counter 1 cap in 8 ounces daily) until stools are soft.  I have called in senna/stool softener combo for you to start to help with movement. Take daily.  Increase Lyrica to 50 mg three times a day/ = 2 pills three times a day. Try to not use narcotic if able.  Once out of lyrica call in and  we will call the higher dose in for you.

## 2015-02-28 NOTE — Progress Notes (Signed)
Subjective:    Patient ID: Priscilla Cantrell, female    DOB: 19-Jun-1935, 80 y.o.   MRN: [id number]  HPI  CC: Pt presents for TCM hospital follow up with new acute abdominal dicomfort  Admission date: 02/19/2015(ED --> admission) Admission Diagnosis  UTI (lower urinary tract infection) [N39.0] Right flank pain [R10.9] Cellulitis of left lower extremity [L03.116]  Discharge Date: 02/22/2015  Discharge Diagnosis  UTI (lower urinary tract infection) [N39.0] Right flank pain [R10.9] Shingles rash   Priscilla Cantrell is a 80 y.o. female with a past medical history significant for COPD on home O2 since pneumonia in Dec 2016, HTN, low back pain, hypothyroidism, and depression who presented to the ED with right flank pain. She was admitted to [facility]  for UTI enterococcus with hematuria, which was treated with 3 days of Levaquin. Symptoms of dysuria have resolved with treatment.  CT scan was obtained: Showed no evidence of obstruction, acute bowel inflammation, diverticulosis/diverticulitis, no hydronephrosis. There was an incidentally found 1.1 cm renal mass in the lower right kidney. MRI abdomen with and without IV contrast was recommended. This was to be done outpatient. Results of some evidence suggesting esophageal dysmotility recurred. Patient states they told her she could wait up to 6 months to follow-up on her MRI, but she would like to proceed with imaging immediately. She incidentally developed shingles right lumbar to suprapubic area during her hospital stay. Her shingles rash was treated with valtrex 7 day, narcotic  and Lyrica. Lyrica was started on the 1/18 of 25 mg TID. She is tolerating lyrica, however stll needing narcotic medication for pain.   New onset right abd discomfort: Patient complains today of new onset right upper abdomen discomfort with a "mass". She states the area of concern is just about her singles rash in his new since her discharge from the hospital.  Patient was prescribed narcotics, in which she is needing to take daily for her shingles pain. Patient is not on a stool softener. She denies any dysuria. She is eating and drinking well. Denies any fevers or chills. He does admit to having a bowel movement while she was in the hospital, and having only one very small bowel movement since being out of the hospital. She does endorse having passed a hard small stool and flatus.      Past Medical History  Diagnosis Date  . Hypertension     EKG 03/2010 normal  . COPD (chronic obstructive pulmonary disease) ([place])     spirometry 01/10/09 FEV 0.97(52%), FEV1% 47  . Hypoxemia     O2 86% RA with exertion 01/10/09, 2 liters oxygen with exertion and sleep  . Pulmonary nodule     CT chest 12/10, June 2011.  No nodule on CT 06/2010.  Marland Kitchen Spinal stenosis     Symptomatic (neurogenic claudication)  . GERD (gastroesophageal reflux disease)   . Hypothyroidism     Hashimoto's, dx'd 1989  . Diverticulosis of colon     Procto '97 and colonoscopy 2002  . DDD (degenerative disc disease), cervical     1998 MRI C5-6 impingemt (left)  . DDD (degenerative disc disease), lumbar   . Spinal stenosis of lumbar region with neurogenic claudication     2008 MRI (L3-4, L4-5, L5-S1)  . Fibrocystic breast disease     w/fibroadenoma.  Bx's showed NO atypia (Dr. Margot Chimes).  Mammo neg 2008.  Marland Kitchen Chest pain, non-cardiac     Cardiac CT showed no signif obstructive dz, mildly elevated calcium  level (Dr. [name]).  . History of double vision     Ophthalmologist, Dr. Herbert Deaner, is further evaluating this with MRI  orbits and limited brain (myesthenia gravis testing neg 07/2010)  . Depression with anxiety 04/15/2011  . Asthma   . Dysplastic nevus of upper extremity 04/2014    R tricep ([name])  . Osteoarthritis, hip, bilateral     End stage; R THA  . History of home oxygen therapy     uses 2 liters at hs  . Family history of adverse reaction to anesthesia     mother died when  patient age 53 receiving anesthesia for thyroid surgery  . Chronic hypoxemic respiratory failure ([place]) 02/02/2015    2L oxygen 24/7   Allergies  Allergen Reactions  . Losartan Other (See Comments)    hyperkalemia  . Penicillins Hives, Swelling and Rash    Has patient had a PCN reaction causing immediate rash, facial/tongue/throat swelling, SOB or lightheadedness with hypotension: YES Has patient had a PCN reaction causing severe rash involving mucus membranes or skin necrosis: NO Has patient had a PCN reaction that required hospitalization NO Has patient had a PCN reaction occurring within the last 10 years: NO If all of the above answers are "NO", then may proceed with Cephalosporin use.   . Cefixime [Kdc:Cefixime] Other (See Comments)    unspecified  . Indocin [Indomethacin] Other (See Comments)    Painful tongue and throat  . Sulfa Antibiotics Other (See Comments)    unspecified  . Ace Inhibitors Other (See Comments)    cough  . Statins Other (See Comments)    Myalgias    Past Surgical History  Procedure Laterality Date  . Cholecystectomy  2001  . Tubal ligation  1974  . Abdominal hysterectomy  1978    no BSO per pt--nonmalignant reasons  . Breast cyst excision      Last screening mammogram 10/2010 was normal.  . Appendectomy    . Tonsillectomy    . Colonoscopy  04/2005    NORMAL-recall 10 yrs  . Transthoracic echocardiogram  08/2014    EF 60-65%, grade I diast dysfxn  . Total hip arthroplasty Right 11/01/2014    Procedure: RIGHT TOTAL HIP ARTHROPLASTY;  Surgeon: Latanya Maudlin, MD;  Location: [facility];  Service: Orthopedics;  Laterality: Right;     Family History  Problem Relation Age of Onset  . Goiter Mother   . Psoriasis Father     od liver  . Diabetes Daughter     Type 2  . Cancer Paternal Aunt     stomach?  . Stroke Maternal Grandfather   . Stroke Paternal Grandmother   . Hypertension Paternal Grandmother   . Hernia Paternal Grandmother   . Cancer Paternal  Grandfather     lung  . Cirrhosis Father    Social History   Social History  . Marital Status: Widowed    Spouse Name: N/A  . Number of Children: 1  . Years of Education: N/A   Occupational History  . SECRETARY    Social History Main Topics  . Smoking status: Former Smoker -- 2.00 packs/day for 35 years    Types: Cigarettes    Quit date: 02/03/1990  . Smokeless tobacco: Never Used  . Alcohol Use: Yes     Comment: Rarely  . Drug Use: No  . Sexual Activity: No   Other Topics Concern  . Not on file   Social History Narrative   Widowed since fall 2014  Lives in [place], [place].     Works as Research officer, trade union.      Review of Systems Negative, with the exception of above mentioned in HPI     Objective:   Physical Exam BP 143/85 mmHg  Pulse 78  Temp(Src) 98 F (36.7 C)  Resp 20  Wt 146 lb (66.225 kg)  SpO2 94%  Body mass index is 27.6 kg/(m^2). Gen: Afebrile. No acute distress.  Nontoxic in appearance, well-developed, well-nourished, Caucasian female. HENT: AT. [place]. Bilateral TM visualized and normal in appearance. MMM.  Eyes:Pupils Equal Round Reactive to light, Extraocular movements intact,  Conjunctiva without redness, discharge or icterus. Neck/lymp/endocrine: Supple, no lymphadenopathy, range of motion, no masses  CV: RRR,  Trace edema, +2/4 P posterior tibialis pulses Chest: CTAB, no wheeze or crackles. Normal respiratory effort, good air movement.  Abd: Soft. round. Mild TTP diffusly. BS present. Moderate stool burden palpated.   MSK: No deformities, normal range of motion. Norvasc intact distally. Skin: healing bruise left lower abdomen. hyperpigmented, macular papular rash right T12 dermatome mid back to the suprapubic area.  1 intact 2 mm vesicle with clear fluid right flank.. No erythema or drainage. Neuro: Normal gait. PERLA. EOMi. Alert. Oriented x3  Psych: Normal affect, dress and demeanor. Normal speech. Normal thought content and judgment..        Assessment & Plan:  Priscilla Cantrell is  A 80 y.o.  female present for transition of care management after recent hospitalization for enterococcus UTI, right flank pain, shingles outbreak with new acute right abdomen discomfort.  History of recent hospitalization - Follow-up labs are pigmented by discharging team. CBC and CMP today. - CBC w/Diff - Comp Met (CMET)  Constipation, unspecified constipation type - New-onset abdominal discomfort, with moderate stool burden on palpation. Believed to be coming from narcotic use/constipation. -Patient encouraged to drink plenty of water, and was otherwise told she had to watch her fluid intake. Start Miralax  over-the-counter 1 In 8 ounces of water daily until stools are soft. - Senna/stool softener combo was called into her pharmacy for her to start. - Could consider suppository unable to have normal bowel movement in 2-3 days. - Pt encouraged follow-up sooner if pain increases or unable to produce bowel movement of above regimen.  Renal mass - New incidental 1.1 cm right renal mass identified on CT. - Patient desiring recommended MRI to be completed as soon as possible. - MRI abdomen with  contrast ordered today. - CMET ordered today.  Urinary tract infection with hematuria, site unspecified/right flank pain - resolved with treatment - CBC w/Diff  Shingles   - Patient finished antiviral, still having pain, needing narcotics. Advised patient to increase her Lyrica to 50 mg 3 times a day. Patient will call in one week prior to needing refill, and this will be called at the higher dose. - CBC w/Diff - Comp Met (CMET)  She  has scheduled follow-up primary care provider next week, shingles can be followed up also at that time.

## 2015-02-28 NOTE — Telephone Encounter (Signed)
Please call pt: - labs are stable (re-checked after hospital discharge)

## 2015-03-01 NOTE — Telephone Encounter (Signed)
Left message with patient lab results on home voice mail per [facility]

## 2015-03-02 ENCOUNTER — Other Ambulatory Visit: Payer: Self-pay | Admitting: Family Medicine

## 2015-03-03 ENCOUNTER — Ambulatory Visit (HOSPITAL_BASED_OUTPATIENT_CLINIC_OR_DEPARTMENT_OTHER)
Admission: RE | Admit: 2015-03-03 | Discharge: 2015-03-03 | Disposition: A | Discharge status: Home / Self Care | Payer: Medicare Other | Source: Ambulatory Visit | Attending: Family Medicine | Admitting: Family Medicine

## 2015-03-03 DIAGNOSIS — N2889 Other specified disorders of kidney and ureter: Secondary | ICD-10-CM

## 2015-03-05 ENCOUNTER — Telehealth: Payer: Self-pay | Admitting: Family Medicine

## 2015-03-05 ENCOUNTER — Ambulatory Visit: Payer: Medicare Other | Admitting: Pulmonary Disease

## 2015-03-05 MED ORDER — HYDROCODONE-ACETAMINOPHEN 5-325 MG PO TABS: 1.0000 | ORAL_TABLET | Freq: Four times a day (QID) | ORAL | Status: DC | PRN

## 2015-03-05 NOTE — Telephone Encounter (Signed)
Patient is requesting Rx for Hydrocodone for shingles. Patient is having trouble sleeping. Please call when ready.

## 2015-03-05 NOTE — Telephone Encounter (Signed)
Please advise. Thanks.  

## 2015-03-05 NOTE — Telephone Encounter (Signed)
Rx put up front for p/u. Pt advised and voiced understanding.   

## 2015-03-05 NOTE — Telephone Encounter (Signed)
Vicodin rx printed. 

## 2015-03-07 ENCOUNTER — Telehealth: Payer: Self-pay | Admitting: *Deleted

## 2015-03-07 ENCOUNTER — Ambulatory Visit: Payer: Medicare Other | Admitting: Family Medicine

## 2015-03-07 ENCOUNTER — Ambulatory Visit (INDEPENDENT_AMBULATORY_CARE_PROVIDER_SITE_OTHER): Payer: Medicare Other | Admitting: Family Medicine

## 2015-03-07 ENCOUNTER — Encounter: Payer: Self-pay | Admitting: Family Medicine

## 2015-03-07 VITALS — BP 127/76 | HR 85 | Temp 97.5°F | Resp 20 | Wt 140.5 lb

## 2015-03-07 DIAGNOSIS — N390 Urinary tract infection, site not specified: Secondary | ICD-10-CM

## 2015-03-07 DIAGNOSIS — B0229 Other postherpetic nervous system involvement: Secondary | ICD-10-CM | POA: Diagnosis not present

## 2015-03-07 DIAGNOSIS — R3989 Other symptoms and signs involving the genitourinary system: Secondary | ICD-10-CM | POA: Diagnosis not present

## 2015-03-07 DIAGNOSIS — R8299 Other abnormal findings in urine: Secondary | ICD-10-CM | POA: Diagnosis not present

## 2015-03-07 DIAGNOSIS — R319 Hematuria, unspecified: Secondary | ICD-10-CM | POA: Diagnosis not present

## 2015-03-07 LAB — POC URINALSYSI DIPSTICK (AUTOMATED)
Glucose, UA: NEGATIVE
Ketones, UA: NEGATIVE
Nitrite, UA: NEGATIVE
Spec Grav, UA: >=1.03
Urobilinogen, UA: 0.2
pH, UA: 5.5

## 2015-03-07 MED ORDER — LEVOFLOXACIN 250 MG PO TABS: 250.0000 mg | ORAL_TABLET | Freq: Every day | ORAL | Status: DC

## 2015-03-07 MED ORDER — PREGABALIN 150 MG PO CAPS: 150.0000 mg | ORAL_CAPSULE | Freq: Two times a day (BID) | ORAL | Status: DC

## 2015-03-07 NOTE — Telephone Encounter (Signed)
-----   Message from Nickola Major sent at 03/07/2015 10:38 AM EST ----- Patient was seen today & has follow-up appt with Dr. Anitra Lauth tomorrow. Does she need to keep that appt or should it be moved out? Thanks

## 2015-03-07 NOTE — Telephone Encounter (Signed)
OK to reschedule tomorrow's f/u visit for 10-14 days from now.

## 2015-03-07 NOTE — Progress Notes (Signed)
Patient ID: Priscilla Cantrell, female   DOB: 1935-02-25, 80 y.o.   MRN: [id number]    Priscilla Cantrell , Sep 18, 1935, 80 y.o., female MRN: [id number] Subjective:   Shingles/postherpetic neuralgia: pt was treated for shingles 10 days ago with antiviral x7 days. She had an increase in her lyrica dose to taper up currently at 50 mg TID. Patient is throat tolerating the Lyrica without negative side effects. She is however having pain that is not controlled by the Lyrica at this time. Patient has received a narcotics refill through her PCP, which she states does help with her pain. Patient reports no blister formations remain.  Abnormal color of urine: Patient states she has noticed an abnormal color of her urine for the last few days. He reports very mild burning with urination over the last day. She reports leg pain, but states this is possibly coming from her shingles. Patient denies fever, chills, nausea, vomiting or diarrhea. She is eating and drinking well. She was recently hospitalized for right flank pain, and was treated for enterococcus UTI with Levaquin. Patient did have resolution of symptoms after completion of antibiotics. Culture and sensitivities at that time was enterococcus sensitive to Levaquin and Macrobid. [name] GFR is 80.  Allergies  Allergen Reactions  . Losartan Other (See Comments)    hyperkalemia  . Penicillins Hives, Swelling and Rash    Has patient had a PCN reaction causing immediate rash, facial/tongue/throat swelling, SOB or lightheadedness with hypotension: YES Has patient had a PCN reaction causing severe rash involving mucus membranes or skin necrosis: NO Has patient had a PCN reaction that required hospitalization NO Has patient had a PCN reaction occurring within the last 10 years: NO If all of the above answers are "NO", then may proceed with Cephalosporin use.   . Cefixime [Kdc:Cefixime] Other (See Comments)    unspecified  . Indocin [Indomethacin] Other (See  Comments)    Painful tongue and throat  . Sulfa Antibiotics Other (See Comments)    unspecified  . Ace Inhibitors Other (See Comments)    cough  . Statins Other (See Comments)    Myalgias    Social History  Substance Use Topics  . Smoking status: Former Smoker -- 2.00 packs/day for 35 years    Types: Cigarettes    Quit date: 02/03/1990  . Smokeless tobacco: Never Used  . Alcohol Use: Yes     Comment: Rarely   Past Medical History  Diagnosis Date  . Hypertension     EKG 03/2010 normal  . COPD (chronic obstructive pulmonary disease) ([place])     spirometry 01/10/09 FEV 0.97(52%), FEV1% 47  . Hypoxemia     O2 86% RA with exertion 01/10/09, 2 liters oxygen with exertion and sleep  . Pulmonary nodule     CT chest 12/10, June 2011.  No nodule on CT 06/2010.  Marland Kitchen Spinal stenosis     Symptomatic (neurogenic claudication)  . GERD (gastroesophageal reflux disease)   . Hypothyroidism     Hashimoto's, dx'd 1989  . Diverticulosis of colon     Procto '97 and colonoscopy 2002  . DDD (degenerative disc disease), cervical     1998 MRI C5-6 impingemt (left)  . DDD (degenerative disc disease), lumbar   . Spinal stenosis of lumbar region with neurogenic claudication     2008 MRI (L3-4, L4-5, L5-S1)  . Fibrocystic breast disease     w/fibroadenoma.  Bx's showed NO atypia (Dr. Margot Chimes).  Mammo neg 2008.  Marland Kitchen Chest pain,  non-cardiac     Cardiac CT showed no signif obstructive dz, mildly elevated calcium level (Dr. [name]).  . History of double vision     Ophthalmologist, Dr. Herbert Deaner, is further evaluating this with MRI  orbits and limited brain (myesthenia gravis testing neg 07/2010)  . Depression with anxiety 04/15/2011  . Asthma   . Dysplastic nevus of upper extremity 04/2014    R tricep ([name])  . Osteoarthritis, hip, bilateral     End stage; R THA  . History of home oxygen therapy     uses 2 liters at hs  . Family history of adverse reaction to anesthesia     mother died when patient age  19 receiving anesthesia for thyroid surgery  . Chronic hypoxemic respiratory failure ([place]) 02/02/2015    2L oxygen 24/7  . Shingles    Past Surgical History  Procedure Laterality Date  . Cholecystectomy  2001  . Tubal ligation  1974  . Abdominal hysterectomy  1978    no BSO per pt--nonmalignant reasons  . Breast cyst excision      Last screening mammogram 10/2010 was normal.  . Appendectomy    . Tonsillectomy    . Colonoscopy  04/2005    NORMAL-recall 10 yrs  . Transthoracic echocardiogram  08/2014    EF 60-65%, grade I diast dysfxn  . Total hip arthroplasty Right 11/01/2014    Procedure: RIGHT TOTAL HIP ARTHROPLASTY;  Surgeon: Latanya Maudlin, MD;  Location: [facility];  Service: Orthopedics;  Laterality: Right;   Family History  Problem Relation Age of Onset  . Goiter Mother   . Psoriasis Father     od liver  . Diabetes Daughter     Type 2  . Cancer Paternal Aunt     stomach?  . Stroke Maternal Grandfather   . Stroke Paternal Grandmother   . Hypertension Paternal Grandmother   . Hernia Paternal Grandmother   . Cancer Paternal Grandfather     lung  . Cirrhosis Father      Medication List       This list is accurate as of: 03/07/15  9:56 AM.  Always use your most recent med list.               ADVAIR DISKUS 250-50 MCG/DOSE Aepb  Generic drug:  Fluticasone-Salmeterol  INHALE 1 PUFF INTO THE LUNGS TWICE DAILY     albuterol 108 (90 Base) MCG/ACT inhaler  Commonly known as:  PROAIR HFA  Inhale 2 puffs into the lungs every 4 (four) hours as needed.     aspirin 325 MG tablet  Take 325 mg by mouth daily.     cetirizine 10 MG tablet  Commonly known as:  ZYRTEC  Take 1 tablet (10 mg total) by mouth daily.     DALIRESP 500 MCG Tabs tablet  Generic drug:  roflumilast  TAKE 1 TABLET BY MOUTH DAILY     diazepam 5 MG tablet  Commonly known as:  VALIUM  Take 1 tablet (5 mg total) by mouth every 12 (twelve) hours as needed.     HYDROcodone-acetaminophen 5-325 MG tablet    Commonly known as:  NORCO/VICODIN  Take 1-2 tablets by mouth every 6 (six) hours as needed.     omeprazole 20 MG capsule  Commonly known as:  PRILOSEC  Take 20 mg by mouth daily.     ondansetron 4 MG tablet  Commonly known as:  ZOFRAN  Take 1 tablet (4 mg total) by mouth every 6 (  six) hours.     pregabalin 25 MG capsule  Commonly known as:  LYRICA  Take 1 capsule (25 mg total) by mouth 3 (three) times daily.     RESTASIS 0.05 % ophthalmic emulsion  Generic drug:  cycloSPORINE  Apply 1 drop to eye 2 (two) times daily.     senna-docusate 8.6-50 MG tablet  Commonly known as:  Senokot-S  Take 2 tablets by mouth daily.     SPIRIVA HANDIHALER 18 MCG inhalation capsule  Generic drug:  tiotropium  INHALE 1 CAPSULE VIA HANDIHALER EVERY DAY AT THE SAME TIME     SYNTHROID 88 MCG tablet  Generic drug:  levothyroxine  TAKE 1 TABLET BY MOUTH DAILY     venlafaxine 50 MG tablet  Commonly known as:  EFFEXOR  TAKE 1 TABLET(50 MG) BY MOUTH TWICE DAILY       ROS: Negative, with the exception of above mentioned in HPI Objective:  BP 127/76 mmHg  Pulse 85  Temp(Src) 97.5 F (36.4 C)  Resp 20  Wt 140 lb 8 oz (63.73 kg)  SpO2 93% Body mass index is 26.56 kg/(m^2). Gen: Afebrile. No acute distress. Nontoxic in appearance, well-developed, well-nourished, elderly Caucasian female. With oxygen tank, however no nasal cannula on face. HENT: AT. [place]. MMM, no oral lesions.  Eyes:Pupils Equal Round Reactive to light, Extraocular movements intact,  Conjunctiva without redness, discharge or icterus. CV: RRR  Chest: CTAB, no wheeze or crackles.  Abd: Soft. Round. ND. Suprapubic tenderness to palpation BS present  MSK: No CVA tenderness bilaterally Skin: No erythema, no vesicles, no drainage. Skin intact. Macular hyperpigmented rash present right T12 dermatome, just above suprapubic area to spine. Mild tenderness to palpation over this area. Neuro: Unchanged l gait. PERLA. EOMi. Alert. Oriented x3   Psych: Normal affect, dress and demeanor. Normal speech. Normal thought content and judgment..   Assessment/Plan: Priscilla Cantrell is a 80 y.o. female present for acute OV for  1. Abnormal urine color - POCT Urinalysis Dipstick (Automated)--> positive for trace blood, small leuks - Urine Culture sent  2. Urinary tract infection with hematuria, site unspecified - Will treat with Levaquin 250 mg 10 days, assuming recurrence of enterococcus UTI. - levofloxacin (LEVAQUIN) 250 MG tablet; Take 1 tablet (250 mg total) by mouth daily.  Dispense: 10 tablet; Refill: 0 - Urine culture has been sent.  3. Postherpetic neuralgia - We'll begin taper up on the Lyrica today, making her current dose 150 mg twice a day. New dosage has been explained to patient in great detail, as well as written in AVS. - She can continue narcotics only as needed for moderate to severe pain provided to her by her PCP. - Discussed postherpetic neuralgia and its potential courses. AVS education on postherpetic neuralgia was provided to patient today. - Patient will need a follow-up in 4 weeks after increasing Lyrica dose.  > 25 minutes spent with patient, >50% of time spent face to face counseling patient and coordinating care.   Renee Raoul Pitch, DO  [place]

## 2015-03-07 NOTE — Telephone Encounter (Signed)
Please advise. Thanks.  

## 2015-03-07 NOTE — Patient Instructions (Signed)
Increasing Lyrica to 150 mg two times a day (increase your home dose to 6 pills two times a day- your current pill is 25 mg) I have called in an antibiotic for you to take once daily for 10 days.  Postherpetic Neuralgia Postherpetic neuralgia (PHN) is nerve pain that occurs after a shingles infection. Shingles is a painful rash that appears on one side of the body, usually on your trunk or face. Shingles is caused by the varicella-zoster virus. This is the same virus that causes chickenpox. In people who have had chickenpox, the virus can resurface years later and cause shingles. You may have PHN if you continue to have pain for 3 months after your shingles rash has gone away. PHN appears in the same area where you had the shingles rash. For most people, PHN goes away within 1 year.  Getting a vaccination for shingles can prevent PHN. This vaccine is recommended for people older than 50. It may prevent shingles and may also lower your risk of PHN if you do get shingles. CAUSES PHN is caused by damage to your nerves from the varicella-zoster virus. This damage makes your nerves overly sensitive.  RISK FACTORS Aging is the biggest risk factor for developing PHN. Most people who get PHN are older than 34. Other risk factors include:  Having very bad pain before your shingles rash starts.  Having a very bad rash.  Having shingles in the nerve that supplies your face and eye (trigeminal nerve). SIGNS AND SYMPTOMS Pain is the main symptom of PHN. The pain is often very bad and may be described as stabbing, burning, or feeling like an electric shock. The pain may come and go or may be there all the time. Pain may be triggered by light touches on the skin or changes in temperature. You may have itching along with the pain. DIAGNOSIS  Your health care provider may diagnose PHN based on your symptoms and your history of shingles. Lab studies and other diagnostic tests are usually not needed. TREATMENT   There is no cure for PHN. Treatment for PHN will focus on pain relief. Over-the-counter pain relievers do not usually relieve PHN pain. You may need to work with a pain specialist. Treatment may include:  Antidepressant medicines to help with pain and improve sleep.  Antiseizure medicines to relieve nerve pain.  Strong pain relievers (opioids).  A numbing patch worn on the skin (lidocaine patch). HOME CARE INSTRUCTIONS It may take a long time to recover from PHN. Work closely with your health care provider, and have a good support system at home.   Take all medicines as directed by your health care provider.  Wear loose, comfortable clothing.  Cover sensitive areas with a dressing to reduce friction from clothing rubbing on the area.  If cold does not make your pain worse, try applying a cool compress or cooling gel pack to the area.  Talk to your health care provider if you feel depressed or desperate. Living with long-term pain can be depressing. SEEK MEDICAL CARE IF:  Your medicine is not helping.  You are struggling to manage your pain at home.   This information is not intended to replace advice given to you by your health care provider. Make sure you discuss any questions you have with your health care provider.   Document Released: 04/12/2002 Document Revised: 02/10/2014 Document Reviewed: 01/11/2013 Elsevier Interactive Patient Education [employer].

## 2015-03-08 ENCOUNTER — Ambulatory Visit: Payer: Medicare Other | Admitting: Family Medicine

## 2015-03-08 LAB — URINE CULTURE: Colony Count: 15000

## 2015-03-09 ENCOUNTER — Telehealth: Payer: Self-pay | Admitting: Family Medicine

## 2015-03-09 NOTE — Telephone Encounter (Signed)
Please call pt: - Her urine culture did not show evidence of a urinary tract infection. If her symptoms have improved with abx she may continue. If they have not improved we would need to further evalaute

## 2015-03-13 ENCOUNTER — Other Ambulatory Visit: Payer: Self-pay | Admitting: Family Medicine

## 2015-03-13 NOTE — Telephone Encounter (Signed)
RF request for synthroid 25mcg LOV: 09/06/14 Next ov: 03/20/15 Last written: 08/01/14 #30 w/ 6RF 05/08/14 TSH 2.86

## 2015-03-13 NOTE — Telephone Encounter (Signed)
Spoke with patient reviewed results and instructions patient states she will call and schedule an appt if her symptoms persist states she has had some improvement.

## 2015-03-20 ENCOUNTER — Encounter: Payer: Self-pay | Admitting: Family Medicine

## 2015-03-20 ENCOUNTER — Ambulatory Visit (INDEPENDENT_AMBULATORY_CARE_PROVIDER_SITE_OTHER): Payer: Medicare Other | Admitting: Family Medicine

## 2015-03-20 VITALS — BP 96/64 | HR 96 | Temp 97.6°F | Resp 16 | Ht 60.0 in | Wt 141.2 lb

## 2015-03-20 DIAGNOSIS — E039 Hypothyroidism, unspecified: Secondary | ICD-10-CM

## 2015-03-20 DIAGNOSIS — J449 Chronic obstructive pulmonary disease, unspecified: Secondary | ICD-10-CM

## 2015-03-20 DIAGNOSIS — N2889 Other specified disorders of kidney and ureter: Secondary | ICD-10-CM | POA: Diagnosis not present

## 2015-03-20 DIAGNOSIS — F418 Other specified anxiety disorders: Secondary | ICD-10-CM | POA: Diagnosis not present

## 2015-03-20 DIAGNOSIS — B0229 Other postherpetic nervous system involvement: Secondary | ICD-10-CM

## 2015-03-20 LAB — TSH: TSH: 2.92 u[IU]/mL (ref 0.35–4.50)

## 2015-03-20 NOTE — Progress Notes (Signed)
OFFICE VISIT  03/20/2015   CC:  Chief Complaint  Patient presents with  . Follow-up    Pt is not fasting.      HPI:    Patient is a 80 y.o. Caucasian female who presents for f/u COPD, anx/dep, and postherpetic neuralgia that she has been taking lyrica for (for the last several weeks). Still with fleeting but fairly strong and frequent pains in area of PHN.  She is taking lyrica 300 mg TID. She has vicodin to use IF she chooses to but often tries NSAIDs at small/med doses first.  Breathing: at rest no SOB.  Has no hypoxia at home when she checks it while doing housework.  Still feels generalized weakness/fatigue that gets worse with mild activity--a sign of her debilitation since getting her hip surgery 10/2014.  Generally is sad/down in spirits lately, esp b/c her dog died of cancer recently--she got tearful today talking about this.  Discussed recent R renal mass (1.1 cm, indeterminate) found on 02/19/15 CT abd/pelv w/contrast. MRI f/u recommended in order to further eval.  She is compliant with her levothyroxine daily.   Past Medical History  Diagnosis Date  . Hypertension     EKG 03/2010 normal  . COPD (chronic obstructive pulmonary disease) ([place])     spirometry 01/10/09 FEV 0.97(52%), FEV1% 47  . Hypoxemia     O2 86% RA with exertion 01/10/09, 2 liters oxygen with exertion and sleep  . Pulmonary nodule     CT chest 12/10, June 2011.  No nodule on CT 06/2010.  Marland Kitchen Spinal stenosis     Symptomatic (neurogenic claudication)  . GERD (gastroesophageal reflux disease)   . Hypothyroidism     Hashimoto's, dx'd 1989  . Diverticulosis of colon     Procto '97 and colonoscopy 2002  . DDD (degenerative disc disease), cervical     1998 MRI C5-6 impingemt (left)  . DDD (degenerative disc disease), lumbar   . Spinal stenosis of lumbar region with neurogenic claudication     2008 MRI (L3-4, L4-5, L5-S1)  . Fibrocystic breast disease     w/fibroadenoma.  Bx's showed NO atypia (Dr.  Margot Chimes).  Mammo neg 2008.  Marland Kitchen Chest pain, non-cardiac     Cardiac CT showed no signif obstructive dz, mildly elevated calcium level (Dr. [name]).  . History of double vision     Ophthalmologist, Dr. Herbert Deaner, is further evaluating this with MRI  orbits and limited brain (myesthenia gravis testing neg 07/2010)  . Depression with anxiety 04/15/2011  . Asthma   . Dysplastic nevus of upper extremity 04/2014    R tricep ([name])  . Osteoarthritis, hip, bilateral     End stage; R THA  . History of home oxygen therapy     uses 2 liters at hs  . Family history of adverse reaction to anesthesia     mother died when patient age 81 receiving anesthesia for thyroid surgery  . Chronic hypoxemic respiratory failure ([place]) 02/02/2015    2L oxygen 24/7  . Shingles 02/2014    R abd/flank    Past Surgical History  Procedure Laterality Date  . Cholecystectomy  2001  . Tubal ligation  1974  . Abdominal hysterectomy  1978    no BSO per pt--nonmalignant reasons  . Breast cyst excision      Last screening mammogram 10/2010 was normal.  . Appendectomy    . Tonsillectomy    . Colonoscopy  04/2005    NORMAL-recall 10 yrs  . Transthoracic  echocardiogram  08/2014    EF 60-65%, grade I diast dysfxn  . Total hip arthroplasty Right 11/01/2014    Procedure: RIGHT TOTAL HIP ARTHROPLASTY;  Surgeon: Latanya Maudlin, MD;  Location: [facility];  Service: Orthopedics;  Laterality: Right;    Outpatient Prescriptions Prior to Visit  Medication Sig Dispense Refill  . ADVAIR DISKUS 250-50 MCG/DOSE AEPB INHALE 1 PUFF INTO THE LUNGS TWICE DAILY 60 each 0  . albuterol (PROAIR HFA) 108 (90 BASE) MCG/ACT inhaler Inhale 2 puffs into the lungs every 4 (four) hours as needed. (Patient taking differently: Inhale 2 puffs into the lungs every 4 (four) hours as needed for wheezing or shortness of breath. ) 1 Inhaler 5  . aspirin 325 MG tablet Take 325 mg by mouth daily.    . cetirizine (ZYRTEC) 10 MG tablet Take 1 tablet (10 mg total) by  mouth daily. 30 tablet 11  . DALIRESP 500 MCG TABS tablet TAKE 1 TABLET BY MOUTH DAILY 90 tablet 0  . diazepam (VALIUM) 5 MG tablet Take 1 tablet (5 mg total) by mouth every 12 (twelve) hours as needed. 45 tablet 0  . HYDROcodone-acetaminophen (NORCO/VICODIN) 5-325 MG tablet Take 1-2 tablets by mouth every 6 (six) hours as needed. 30 tablet 0  . omeprazole (PRILOSEC) 20 MG capsule Take 20 mg by mouth daily.      . pregabalin (LYRICA) 150 MG capsule Take 1 capsule (150 mg total) by mouth 2 (two) times daily. 60 capsule 2  . RESTASIS 0.05 % ophthalmic emulsion Apply 1 drop to eye 2 (two) times daily.  4  . senna-docusate (SENOKOT-S) 8.6-50 MG tablet Take 2 tablets by mouth daily. 30 tablet 0  . SPIRIVA HANDIHALER 18 MCG inhalation capsule INHALE 1 CAPSULE VIA HANDIHALER EVERY DAY AT THE SAME TIME 30 capsule 5  . SYNTHROID 88 MCG tablet TAKE 1 TABLET BY MOUTH DAILY 30 tablet 3  . venlafaxine (EFFEXOR) 50 MG tablet TAKE 1 TABLET(50 MG) BY MOUTH TWICE DAILY 60 tablet 1  . levofloxacin (LEVAQUIN) 250 MG tablet Take 1 tablet (250 mg total) by mouth daily. (Patient not taking: Reported on 03/20/2015) 10 tablet 0  . ondansetron (ZOFRAN) 4 MG tablet Take 1 tablet (4 mg total) by mouth every 6 (six) hours. (Patient not taking: Reported on 03/20/2015) 12 tablet 0   No facility-administered medications prior to visit.    Allergies  Allergen Reactions  . Losartan Other (See Comments)    hyperkalemia  . Penicillins Hives, Swelling and Rash    Has patient had a PCN reaction causing immediate rash, facial/tongue/throat swelling, SOB or lightheadedness with hypotension: YES Has patient had a PCN reaction causing severe rash involving mucus membranes or skin necrosis: NO Has patient had a PCN reaction that required hospitalization NO Has patient had a PCN reaction occurring within the last 10 years: NO If all of the above answers are "NO", then may proceed with Cephalosporin use.   . Cefixime [Kdc:Cefixime]  Other (See Comments)    unspecified  . Indocin [Indomethacin] Other (See Comments)    Painful tongue and throat  . Sulfa Antibiotics Other (See Comments)    unspecified  . Ace Inhibitors Other (See Comments)    cough  . Statins Other (See Comments)    Myalgias     ROS As per HPI  PE: Blood pressure 96/64, pulse 96, temperature 97.6 F (36.4 C), temperature source Oral, resp. rate 16, height 5' (1.524 m), weight 141 lb 4 oz (64.071 kg), SpO2 95 %.  Gen: Alert, well appearing.  Patient is oriented to person, place, time, and situation. [id number]: no injection, icteris, swelling, or exudate.  EOMI, PERRLA. Mouth: lips without lesion/swelling.  Oral mucosa pink and moist. Oropharynx without erythema, exudate, or swelling.  CV: RRR, no m/r/g.   LUNGS: CTA bilat, nonlabored resps, good aeration in all lung fields. EXT: no clubbing, cyanosis, or edema.    LABS:  Lab Results  Component Value Date   TSH 2.86 05/08/2014   Lab Results  Component Value Date   CHOL 195 08/31/2013   HDL 62.50 08/31/2013   LDLCALC 106* 08/31/2013   LDLDIRECT 131.3 03/07/2010   TRIG 132.0 08/31/2013   CHOLHDL 3 08/31/2013     Chemistry      Component Value Date/Time   NA 139 02/28/2015 1144   K 4.3 02/28/2015 1144   CL 103 02/28/2015 1144   CO2 27 02/28/2015 1144   BUN 13 02/28/2015 1144   CREATININE 0.72 02/28/2015 1144   CREATININE 0.68 06/02/2014 1518      Component Value Date/Time   CALCIUM 9.1 02/28/2015 1144   ALKPHOS 70 02/28/2015 1144   AST 13 02/28/2015 1144   ALT 11 02/28/2015 1144   BILITOT 0.3 02/28/2015 1144     Lab Results  Component Value Date   WBC 6.4 02/28/2015   HGB 11.8* 02/28/2015   HCT 36.8 02/28/2015   MCV 81.8 02/28/2015   PLT 351.0 02/28/2015    IMPRESSION AND PLAN:  1) COPD: stable, on prn oxygen only. Continue chronic and prn meds.  2) Postherpetic neuralgia: responding fairly well to lyrica at max dose of 300mg  tid. We'll continue this until  hopefully she gets to a point where she is pain-free and then we can try to ween her down off this med.  3) Anx/dep: currently grieving over the death of her long time companion dog. She'll continue venlafaxine 50mg  bid, which is her "increased dose".  When she feels signif improved then she always backs the dose down to 50mg  once a day per her choosing.  4) Hypothyroidism: check TSH today.  5) R renal mass on CT abd/pelv w/contrast 02/19/15.   Radiologist recommended MRI abd w and w/o contrast to follow this up---ordered and pt had to cancel b/c she had shingles.  She'll call med center [facility] radiology and reschedule this.  6) Screening: she is due for screening mammogram and for screening colonoscopy but I recommended she put these off for at least 6 mo to allow herself to recover some from the recent problems she has had both physically and emotionally.  She agreed with this approach. Regarding her colonoscopy, she thinks she got her 2007 TCS by Dr. Earlean Shawl and says she was "dismissed from his practice b/c he has all of his patients on a 5 yr f/u schedule" per pt report today.  For this reason, she asks to be referred to [facility] when it comes time to repeat her colonoscopy.  An After Visit Summary was printed and given to the patient.  FOLLOW UP: Return in about 3 months (around 06/17/2015) for routine chronic illness f/u (30 min).

## 2015-03-20 NOTE — Progress Notes (Signed)
Pre visit review using our clinic review tool, if applicable. No additional management support is needed unless otherwise documented below in the visit note. 

## 2015-03-21 ENCOUNTER — Other Ambulatory Visit: Payer: Self-pay | Admitting: Emergency Medicine

## 2015-03-21 ENCOUNTER — Other Ambulatory Visit: Payer: Self-pay | Admitting: *Deleted

## 2015-03-21 MED ORDER — FLUTICASONE-SALMETEROL 250-50 MCG/DOSE IN AEPB: INHALATION_SPRAY | RESPIRATORY_TRACT | Status: DC

## 2015-03-31 ENCOUNTER — Ambulatory Visit (HOSPITAL_BASED_OUTPATIENT_CLINIC_OR_DEPARTMENT_OTHER): Payer: Medicare Other

## 2015-03-31 ENCOUNTER — Ambulatory Visit (HOSPITAL_BASED_OUTPATIENT_CLINIC_OR_DEPARTMENT_OTHER)
Admission: RE | Admit: 2015-03-31 | Discharge: 2015-03-31 | Disposition: A | Discharge status: Home / Self Care | Payer: Medicare Other | Source: Ambulatory Visit | Attending: Family Medicine | Admitting: Family Medicine

## 2015-03-31 DIAGNOSIS — N2889 Other specified disorders of kidney and ureter: Secondary | ICD-10-CM | POA: Insufficient documentation

## 2015-03-31 MED ORDER — GADOBENATE DIMEGLUMINE 529 MG/ML IV SOLN
12.0000 mL | Freq: Once | INTRAVENOUS | Status: AC | PRN
  Administered 2015-03-31: 12 mL via INTRAVENOUS

## 2015-04-02 ENCOUNTER — Telehealth: Payer: Self-pay | Admitting: Family Medicine

## 2015-04-02 ENCOUNTER — Other Ambulatory Visit: Payer: Self-pay | Admitting: Nurse Practitioner

## 2015-04-02 DIAGNOSIS — N2889 Other specified disorders of kidney and ureter: Secondary | ICD-10-CM

## 2015-04-02 NOTE — Telephone Encounter (Signed)
Please call pt: - the small mass on her right kidney, that was incidentally found during her hospital stay by CT, does not seem to be all that impressive via MRI. In fact it was barely visible, and much smaller. - It is recommended to follow-up in 6 months with a CT just to ensure no growth. This doesn't seem to be anything worrisome, and if anything self resolving.

## 2015-04-02 NOTE — Telephone Encounter (Signed)
Spoke with patient reviewed information regarding MRI results . Patient verbalized understanding

## 2015-04-04 DIAGNOSIS — H518 Other specified disorders of binocular movement: Secondary | ICD-10-CM | POA: Diagnosis not present

## 2015-04-04 DIAGNOSIS — Z961 Presence of intraocular lens: Secondary | ICD-10-CM | POA: Diagnosis not present

## 2015-04-04 DIAGNOSIS — H524 Presbyopia: Secondary | ICD-10-CM | POA: Diagnosis not present

## 2015-04-04 DIAGNOSIS — H43813 Vitreous degeneration, bilateral: Secondary | ICD-10-CM | POA: Diagnosis not present

## 2015-04-04 DIAGNOSIS — H16229 Keratoconjunctivitis sicca, not specified as Sjogren's, unspecified eye: Secondary | ICD-10-CM | POA: Diagnosis not present

## 2015-04-18 ENCOUNTER — Ambulatory Visit (INDEPENDENT_AMBULATORY_CARE_PROVIDER_SITE_OTHER): Payer: Medicare Other | Admitting: Family Medicine

## 2015-04-18 ENCOUNTER — Encounter: Payer: Self-pay | Admitting: Family Medicine

## 2015-04-18 VITALS — BP 143/84 | HR 78 | Temp 97.8°F | Resp 16 | Ht 60.0 in | Wt 140.5 lb

## 2015-04-18 DIAGNOSIS — N390 Urinary tract infection, site not specified: Secondary | ICD-10-CM | POA: Diagnosis not present

## 2015-04-18 DIAGNOSIS — R300 Dysuria: Secondary | ICD-10-CM | POA: Diagnosis not present

## 2015-04-18 DIAGNOSIS — F4321 Adjustment disorder with depressed mood: Secondary | ICD-10-CM

## 2015-04-18 DIAGNOSIS — F5105 Insomnia due to other mental disorder: Secondary | ICD-10-CM | POA: Diagnosis not present

## 2015-04-18 DIAGNOSIS — K529 Noninfective gastroenteritis and colitis, unspecified: Secondary | ICD-10-CM

## 2015-04-18 LAB — POCT URINALYSIS DIPSTICK
Glucose, UA: NEGATIVE
Ketones, UA: NEGATIVE
Nitrite, UA: POSITIVE
Spec Grav, UA: >=1.03
Urobilinogen, UA: 0.2
pH, UA: 5.5

## 2015-04-18 MED ORDER — CIPROFLOXACIN HCL 500 MG PO TABS: 500.0000 mg | ORAL_TABLET | Freq: Two times a day (BID) | ORAL | Status: DC

## 2015-04-18 MED ORDER — DIAZEPAM 5 MG PO TABS: ORAL_TABLET | ORAL | Status: DC

## 2015-04-18 MED ORDER — METRONIDAZOLE 500 MG PO TABS: 500.0000 mg | ORAL_TABLET | Freq: Two times a day (BID) | ORAL | Status: DC

## 2015-04-18 NOTE — Progress Notes (Signed)
OFFICE VISIT  04/18/2015  CC:  Chief Complaint  Patient presents with  . Insomnia  . Frequent BM's  . Urinary Tract Infection    burning when she urinates   HPI:    Patient is a 80 y.o. Caucasian female who presents for about 1 mo of increased frequency of stools, usually morning time is the time period when she has this issue.  Says she goes anywhere from 1-4 times, has solid stool but it "falls apart easily when she flushes".  No diarrhea or stool leakage.  No longer taking any laxatives/stool softeners.  Eating has improved over the last couple months and also includes fruit now.  No abd discomfort and no blood or pus in the stool.  Color of stool is brown.   She does not feel bloated or "backed up"/constipated.  Also has urinary sx's: burns when she urinates, urine color is cloudy yellow and has foul odor to it.  Has been going on for 2 wks or so.  She has no signif urgency but has chronic/unchanged urinary frequency. Has some vag d/c and mild vag itching.  Tried monistat but she could only get it to go in correctly x 1 dose. D/C is creamy colored and thick, says it smells foul.  Spends a lot of nights tossing and turning, mind goes over and over things.  Some nights stays up the entire night w/out sleep.  Began weeping at end of visit today, says she is still not over the recent death of her dog, admits to feeling depressed about this and knows that the sleep problem could be related. Says she usually takes 1/2-1 of the 5mg  valium qAM and a whole 5mg  valium qhs.  Past Medical History  Diagnosis Date  . Hypertension     EKG 03/2010 normal  . COPD (chronic obstructive pulmonary disease) ([place])     spirometry 01/10/09 FEV 0.97(52%), FEV1% 47  . Hypoxemia     O2 86% RA with exertion 01/10/09, 2 liters oxygen with exertion and sleep  . Pulmonary nodule     CT chest 12/10, June 2011.  No nodule on CT 06/2010.  Marland Kitchen Spinal stenosis     Symptomatic (neurogenic claudication)  . GERD  (gastroesophageal reflux disease)   . Hypothyroidism     Hashimoto's, dx'd 1989  . Diverticulosis of colon     Procto '97 and colonoscopy 2002  . DDD (degenerative disc disease), cervical     1998 MRI C5-6 impingemt (left)  . DDD (degenerative disc disease), lumbar   . Spinal stenosis of lumbar region with neurogenic claudication     2008 MRI (L3-4, L4-5, L5-S1)  . Fibrocystic breast disease     w/fibroadenoma.  Bx's showed NO atypia (Dr. Margot Chimes).  Mammo neg 2008.  Marland Kitchen Chest pain, non-cardiac     Cardiac CT showed no signif obstructive dz, mildly elevated calcium level (Dr. [name]).  . History of double vision     Ophthalmologist, Dr. Herbert Deaner, is further evaluating this with MRI  orbits and limited brain (myesthenia gravis testing neg 07/2010)  . Depression with anxiety 04/15/2011  . Asthma   . Dysplastic nevus of upper extremity 04/2014    R tricep ([name])  . Osteoarthritis, hip, bilateral     End stage; R THA  . History of home oxygen therapy     uses 2 liters at hs  . Family history of adverse reaction to anesthesia     mother died when patient age 64 receiving anesthesia  for thyroid surgery  . Chronic hypoxemic respiratory failure ([place]) 02/02/2015    2L oxygen 24/7  . Shingles 02/2014    R abd/flank  . Right renal mass 2017    found on CT.  F/u MRI 03/31/15 showed it to be smaller and less likely of any significance but plan for repeat CT with renal protocol 6 mo per radiologist's recommendations.    Past Surgical History  Procedure Laterality Date  . Cholecystectomy  2001  . Tubal ligation  1974  . Abdominal hysterectomy  1978    no BSO per pt--nonmalignant reasons  . Breast cyst excision      Last screening mammogram 10/2010 was normal.  . Appendectomy    . Tonsillectomy    . Colonoscopy  04/2005    NORMAL-recall 10 yrs  . Transthoracic echocardiogram  08/2014    EF 60-65%, grade I diast dysfxn  . Total hip arthroplasty Right 11/01/2014    Procedure: RIGHT TOTAL HIP  ARTHROPLASTY;  Surgeon: Latanya Maudlin, MD;  Location: [facility];  Service: Orthopedics;  Laterality: Right;    Outpatient Prescriptions Prior to Visit  Medication Sig Dispense Refill  . albuterol (PROAIR HFA) 108 (90 BASE) MCG/ACT inhaler Inhale 2 puffs into the lungs every 4 (four) hours as needed. (Patient taking differently: Inhale 2 puffs into the lungs every 4 (four) hours as needed for wheezing or shortness of breath. ) 1 Inhaler 5  . aspirin 325 MG tablet Take 325 mg by mouth daily.    . cetirizine (ZYRTEC) 10 MG tablet Take 1 tablet (10 mg total) by mouth daily. 30 tablet 11  . DALIRESP 500 MCG TABS tablet TAKE 1 TABLET BY MOUTH DAILY 90 tablet 0  . Fluticasone-Salmeterol (ADVAIR DISKUS) 250-50 MCG/DOSE AEPB INHALE 1 PUFF INTO THE LUNGS TWICE DAILY 180 each 3  . HYDROcodone-acetaminophen (NORCO/VICODIN) 5-325 MG tablet Take 1-2 tablets by mouth every 6 (six) hours as needed. 30 tablet 0  . omeprazole (PRILOSEC) 20 MG capsule Take 20 mg by mouth daily.      . RESTASIS 0.05 % ophthalmic emulsion Apply 1 drop to eye 2 (two) times daily.  4  . SPIRIVA HANDIHALER 18 MCG inhalation capsule INHALE 1 CAPSULE VIA HANDIHALER EVERY DAY AT THE SAME TIME 30 capsule 5  . SYNTHROID 88 MCG tablet TAKE 1 TABLET BY MOUTH DAILY 30 tablet 3  . venlafaxine (EFFEXOR) 50 MG tablet TAKE 1 TABLET(50 MG) BY MOUTH TWICE DAILY 60 tablet 1  . diazepam (VALIUM) 5 MG tablet Take 1 tablet (5 mg total) by mouth every 12 (twelve) hours as needed. 45 tablet 0  . senna-docusate (SENOKOT-S) 8.6-50 MG tablet Take 2 tablets by mouth daily. (Patient not taking: Reported on 04/18/2015) 30 tablet 0  . pregabalin (LYRICA) 150 MG capsule Take 1 capsule (150 mg total) by mouth 2 (two) times daily. (Patient not taking: Reported on 04/18/2015) 60 capsule 2   No facility-administered medications prior to visit.    Allergies  Allergen Reactions  . Losartan Other (See Comments)    hyperkalemia  . Penicillins Hives, Swelling and Rash     Has patient had a PCN reaction causing immediate rash, facial/tongue/throat swelling, SOB or lightheadedness with hypotension: YES Has patient had a PCN reaction causing severe rash involving mucus membranes or skin necrosis: NO Has patient had a PCN reaction that required hospitalization NO Has patient had a PCN reaction occurring within the last 10 years: NO If all of the above answers are "NO", then may proceed  with Cephalosporin use.   . Cefixime [Kdc:Cefixime] Other (See Comments)    unspecified  . Indocin [Indomethacin] Other (See Comments)    Painful tongue and throat  . Sulfa Antibiotics Other (See Comments)    unspecified  . Ace Inhibitors Other (See Comments)    cough  . Statins Other (See Comments)    Myalgias     ROS As per HPI  PE: Blood pressure 143/84, pulse 78, temperature 97.8 F (36.6 C), temperature source Oral, resp. rate 16, height 5' (1.524 m), weight 140 lb 8 oz (63.73 kg), SpO2 98 %. Gen: Alert, well appearing.  Patient is oriented to person, place, time, and situation. AFFECT: pleasant, lucid thought and speech. She cried at the end of today's visit when discussing her depressed mood and how she is still mourning the death of her dog---we discussed how this insomnia lately is likely as symptom of her depression. ABD: soft, nondistended.  No mass.  Mild RLQ TTP diffusely--she associates this with "soreness" due to relatively recent case of shingles.  Focal LLQ TTP that I found once but on re-exam today could not reproduce. No guarding or rebound tenderness.  NO bruit, no HSM, no mass.     LABS:  CC UA today: trace LEU, trace blood, +nitrite, 30 mg/dl protein, SG >1.030.  Lab Results  Component Value Date   WBC 6.4 02/28/2015   HGB 11.8* 02/28/2015   HCT 36.8 02/28/2015   MCV 81.8 02/28/2015   PLT 351.0 02/28/2015     Chemistry      Component Value Date/Time   NA 139 02/28/2015 1144   K 4.3 02/28/2015 1144   CL 103 02/28/2015 1144   CO2 27  02/28/2015 1144   BUN 13 02/28/2015 1144   CREATININE 0.72 02/28/2015 1144   CREATININE 0.68 06/02/2014 1518      Component Value Date/Time   CALCIUM 9.1 02/28/2015 1144   ALKPHOS 70 02/28/2015 1144   AST 13 02/28/2015 1144   ALT 11 02/28/2015 1144   BILITOT 0.3 02/28/2015 1144     Lab Results  Component Value Date   TSH 2.92 03/20/2015   IMPRESSION AND PLAN:  1) UTI: cipro 500 mg bid x 5d.  2) Bacterial vaginosis (by history): flagyl 500 mg bid x 7d.  3) Frequent stooling the last 1 mo: I think this is dietary related.  Reassured pt. I suggested she try an OTC fiber supplement to bulk things up some.    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 She is getting extra support emotionally from her daughter and from her church support group. I recommended she increase her valium qhs to TWO of the 5mg  tabs.  An After Visit Summary was printed and given to the patient.  FOLLOW UP: Return in about 2 weeks (around 05/02/2015) for f/u UTI/BV/insomnia (30 min).

## 2015-04-18 NOTE — Progress Notes (Signed)
Pre visit review using our clinic review tool, if applicable. No additional management support is needed unless otherwise documented below in the visit note. 

## 2015-04-20 LAB — URINE CULTURE: Colony Count: >=100000

## 2015-04-25 ENCOUNTER — Telehealth: Payer: Self-pay | Admitting: *Deleted

## 2015-04-25 NOTE — Telephone Encounter (Signed)
Pt LMOM on 04/25/15 stating that she has had diarrhea x 2 days. She stated that her stools have been loose and dark brown/greenish in color. She stated that she is concerned that the medications Dr. Anitra Lauth put her on at her last ov (cipro and flagyl) may be causing this. She is not sure if she should keep taking these medications. Please advise. Thanks.

## 2015-04-25 NOTE — Telephone Encounter (Signed)
OK to stop antibiotics now.

## 2015-04-26 NOTE — Telephone Encounter (Signed)
Pt advised and voiced understanding.  She wanted to know if it was okay for her to take imodium. Per Dr. Anitra Lauth okay to take but only two dose no more than that. Pt advised and voiced understanding.

## 2015-05-01 ENCOUNTER — Other Ambulatory Visit: Payer: Self-pay | Admitting: Pulmonary Disease

## 2015-05-02 ENCOUNTER — Encounter: Payer: Self-pay | Admitting: Family Medicine

## 2015-05-02 ENCOUNTER — Ambulatory Visit (INDEPENDENT_AMBULATORY_CARE_PROVIDER_SITE_OTHER): Payer: Medicare Other | Admitting: Family Medicine

## 2015-05-02 VITALS — BP 126/84 | HR 81 | Temp 98.2°F | Resp 16 | Ht 60.0 in | Wt 141.5 lb

## 2015-05-02 DIAGNOSIS — R293 Abnormal posture: Secondary | ICD-10-CM

## 2015-05-02 DIAGNOSIS — R2681 Unsteadiness on feet: Secondary | ICD-10-CM

## 2015-05-02 DIAGNOSIS — N3000 Acute cystitis without hematuria: Secondary | ICD-10-CM

## 2015-05-02 DIAGNOSIS — Z96641 Presence of right artificial hip joint: Secondary | ICD-10-CM

## 2015-05-02 DIAGNOSIS — R197 Diarrhea, unspecified: Secondary | ICD-10-CM

## 2015-05-02 NOTE — Progress Notes (Signed)
Pre visit review using our clinic review tool, if applicable. No additional management support is needed unless otherwise documented below in the visit note. 

## 2015-05-02 NOTE — Progress Notes (Signed)
OFFICE VISIT  05/02/2015   CC:  Chief Complaint  Patient presents with  . Follow-up    UTI, BV and Insomnia   HPI:    Patient is a 80 y.o. Caucasian female who presents for 2 week f/u UTI. She had diarrhea towards the end abx for recent UTI and vaginitis so she stopped them (cipro/flagyl)--says all UTI sx's are gone. All vaginitis sx's gone. The diarrhea has nearly resolved since d/c of the cipro and flagyl.  Had 2 BMs this morning, almost normally formed.  Area where she had shingles is much improved: barely itching.  Has some instability and balance issues with getting in/out of car and with walking.  This has been since finishing the small amount of rehab she got after her R THA 10/2014.  BP checks at home: normal or near normal since last visit.  No HAs, no CP, no palpitations, no dizziness.   Past Medical History  Diagnosis Date  . Hypertension     EKG 03/2010 normal  . COPD (chronic obstructive pulmonary disease) ([place])     spirometry 01/10/09 FEV 0.97(52%), FEV1% 47  . Hypoxemia     O2 86% RA with exertion 01/10/09, 2 liters oxygen with exertion and sleep  . Pulmonary nodule     CT chest 12/10, June 2011.  No nodule on CT 06/2010.  Marland Kitchen Spinal stenosis     Symptomatic (neurogenic claudication)  . GERD (gastroesophageal reflux disease)   . Hypothyroidism     Hashimoto's, dx'd 1989  . Diverticulosis of colon     Procto '97 and colonoscopy 2002  . DDD (degenerative disc disease), cervical     1998 MRI C5-6 impingemt (left)  . DDD (degenerative disc disease), lumbar   . Spinal stenosis of lumbar region with neurogenic claudication     2008 MRI (L3-4, L4-5, L5-S1)  . Fibrocystic breast disease     w/fibroadenoma.  Bx's showed NO atypia (Dr. Margot Chimes).  Mammo neg 2008.  Marland Kitchen Chest pain, non-cardiac     Cardiac CT showed no signif obstructive dz, mildly elevated calcium level (Dr. [name]).  . History of double vision     Ophthalmologist, Dr. Herbert Deaner, is further evaluating this  with MRI  orbits and limited brain (myesthenia gravis testing neg 07/2010)  . Depression with anxiety 04/15/2011  . Asthma   . Dysplastic nevus of upper extremity 04/2014    R tricep ([name])  . Osteoarthritis, hip, bilateral     End stage; R THA  . History of home oxygen therapy     uses 2 liters at hs  . Family history of adverse reaction to anesthesia     mother died when patient age 84 receiving anesthesia for thyroid surgery  . Chronic hypoxemic respiratory failure ([place]) 02/02/2015    2L oxygen 24/7  . Shingles 02/2014    R abd/flank  . Right renal mass 2017    found on CT.  F/u MRI 03/31/15 showed it to be smaller and less likely of any significance but plan for repeat CT with renal protocol 6 mo per radiologist's recommendations.    Past Surgical History  Procedure Laterality Date  . Cholecystectomy  2001  . Tubal ligation  1974  . Abdominal hysterectomy  1978    no BSO per pt--nonmalignant reasons  . Breast cyst excision      Last screening mammogram 10/2010 was normal.  . Appendectomy    . Tonsillectomy    . Colonoscopy  04/2005    NORMAL-recall  10 yrs  . Transthoracic echocardiogram  08/2014    EF 60-65%, grade I diast dysfxn  . Total hip arthroplasty Right 11/01/2014    Procedure: RIGHT TOTAL HIP ARTHROPLASTY;  Surgeon: Latanya Maudlin, MD;  Location: [facility];  Service: Orthopedics;  Laterality: Right;    Outpatient Prescriptions Prior to Visit  Medication Sig Dispense Refill  . albuterol (PROAIR HFA) 108 (90 BASE) MCG/ACT inhaler Inhale 2 puffs into the lungs every 4 (four) hours as needed. (Patient taking differently: Inhale 2 puffs into the lungs every 4 (four) hours as needed for wheezing or shortness of breath. ) 1 Inhaler 5  . aspirin 325 MG tablet Take 325 mg by mouth daily.    . cetirizine (ZYRTEC) 10 MG tablet Take 1 tablet (10 mg total) by mouth daily. 30 tablet 11  . DALIRESP 500 MCG TABS tablet TAKE 1 TABLET BY MOUTH DAILY 90 tablet 0  . diazepam (VALIUM) 5  MG tablet 1 tab po qAM and 2 tabs po qhs 90 tablet 0  . Fluticasone-Salmeterol (ADVAIR DISKUS) 250-50 MCG/DOSE AEPB INHALE 1 PUFF INTO THE LUNGS TWICE DAILY 180 each 3  . HYDROcodone-acetaminophen (NORCO/VICODIN) 5-325 MG tablet Take 1-2 tablets by mouth every 6 (six) hours as needed. 30 tablet 0  . omeprazole (PRILOSEC) 20 MG capsule Take 20 mg by mouth daily.      . RESTASIS 0.05 % ophthalmic emulsion Apply 1 drop to eye 2 (two) times daily.  4  . senna-docusate (SENOKOT-S) 8.6-50 MG tablet Take 2 tablets by mouth daily. 30 tablet 0  . SPIRIVA HANDIHALER 18 MCG inhalation capsule INHALE 1 CAPSULE VIA HANDIHALER EVERY DAY AT THE SAME TIME 30 capsule 5  . SYNTHROID 88 MCG tablet TAKE 1 TABLET BY MOUTH DAILY 30 tablet 3  . venlafaxine (EFFEXOR) 50 MG tablet TAKE 1 TABLET(50 MG) BY MOUTH TWICE DAILY 60 tablet 1  . ciprofloxacin (CIPRO) 500 MG tablet Take 1 tablet (500 mg total) by mouth 2 (two) times daily. (Patient not taking: Reported on 05/02/2015) 10 tablet 0  . metroNIDAZOLE (FLAGYL) 500 MG tablet Take 1 tablet (500 mg total) by mouth 2 (two) times daily. (Patient not taking: Reported on 05/02/2015) 14 tablet 0   No facility-administered medications prior to visit.    Allergies  Allergen Reactions  . Losartan Other (See Comments)    hyperkalemia  . Penicillins Hives, Swelling and Rash    Has patient had a PCN reaction causing immediate rash, facial/tongue/throat swelling, SOB or lightheadedness with hypotension: YES Has patient had a PCN reaction causing severe rash involving mucus membranes or skin necrosis: NO Has patient had a PCN reaction that required hospitalization NO Has patient had a PCN reaction occurring within the last 10 years: NO If all of the above answers are "NO", then may proceed with Cephalosporin use.   . Cefixime [Kdc:Cefixime] Other (See Comments)    unspecified  . Indocin [Indomethacin] Other (See Comments)    Painful tongue and throat  . Sulfa Antibiotics Other  (See Comments)    unspecified  . Ace Inhibitors Other (See Comments)    cough  . Statins Other (See Comments)    Myalgias     ROS As per HPI  PE: Blood pressure 126/84, pulse 81, temperature 98.2 F (36.8 C), temperature source Oral, resp. rate 16, height 5' (1.524 m), weight 141 lb 8 oz (64.184 kg), SpO2 93 %. Gen: Alert, well appearing.  Patient is oriented to person, place, time, and situation. AFFECT: pleasant, lucid thought  and speech. CV: RRR, no m/r/g.   LUNGS: CTA bilat, nonlabored resps, good aeration in all lung fields. EXT: no clubbing, cyanosis, or edema.   LABS:  NONE today  IMPRESSION AND PLAN:  1) UTI; resolved with cipro.  2) BV: resolved with flagyl.  3) Antibiotic-induced diarrhea: resolving since d/c of abx.  4) Gait instability and postural imbalance with walking, all starting since having her R THA 10/2014. Will refer to [facility] for help with this.  5) HTN: Stable off meds at this time.  Continue with periodic home bp monitoring.  An After Visit Summary was printed and given to the patient.  FOLLOW UP: Return for keep 06/18/15 appt.  Signed:  Crissie Sickles, MD           05/02/2015

## 2015-05-15 DIAGNOSIS — Z1231 Encounter for screening mammogram for malignant neoplasm of breast: Secondary | ICD-10-CM | POA: Diagnosis not present

## 2015-05-15 DIAGNOSIS — Z853 Personal history of malignant neoplasm of breast: Secondary | ICD-10-CM | POA: Diagnosis not present

## 2015-05-23 ENCOUNTER — Ambulatory Visit (INDEPENDENT_AMBULATORY_CARE_PROVIDER_SITE_OTHER): Payer: Medicare Other | Admitting: Pulmonary Disease

## 2015-05-23 ENCOUNTER — Encounter: Payer: Self-pay | Admitting: Pulmonary Disease

## 2015-05-23 VITALS — BP 108/70 | HR 84 | Ht 60.0 in | Wt 141.4 lb

## 2015-05-23 DIAGNOSIS — J449 Chronic obstructive pulmonary disease, unspecified: Secondary | ICD-10-CM | POA: Diagnosis not present

## 2015-05-23 DIAGNOSIS — J9611 Chronic respiratory failure with hypoxia: Secondary | ICD-10-CM | POA: Diagnosis not present

## 2015-05-23 NOTE — Progress Notes (Signed)
Current Outpatient Prescriptions on File Prior to Visit  Medication Sig  . albuterol (PROAIR HFA) 108 (90 BASE) MCG/ACT inhaler Inhale 2 puffs into the lungs every 4 (four) hours as needed. (Patient taking differently: Inhale 2 puffs into the lungs every 4 (four) hours as needed for wheezing or shortness of breath. )  . aspirin 325 MG tablet Take 325 mg by mouth daily.  . cetirizine (ZYRTEC) 10 MG tablet Take 1 tablet (10 mg total) by mouth daily.  Marland Kitchen DALIRESP 500 MCG TABS tablet TAKE 1 TABLET BY MOUTH DAILY  . diazepam (VALIUM) 5 MG tablet 1 tab po qAM and 2 tabs po qhs  . Fluticasone-Salmeterol (ADVAIR DISKUS) 250-50 MCG/DOSE AEPB INHALE 1 PUFF INTO THE LUNGS TWICE DAILY  . HYDROcodone-acetaminophen (NORCO/VICODIN) 5-325 MG tablet Take 1-2 tablets by mouth every 6 (six) hours as needed.  Marland Kitchen omeprazole (PRILOSEC) 20 MG capsule Take 20 mg by mouth daily.    . RESTASIS 0.05 % ophthalmic emulsion Apply 1 drop to eye 2 (two) times daily.  Marland Kitchen senna-docusate (SENOKOT-S) 8.6-50 MG tablet Take 2 tablets by mouth daily.  Marland Kitchen SPIRIVA HANDIHALER 18 MCG inhalation capsule INHALE 1 CAPSULE VIA HANDIHALER EVERY DAY AT THE SAME TIME  . SYNTHROID 88 MCG tablet TAKE 1 TABLET BY MOUTH DAILY  . venlafaxine (EFFEXOR) 50 MG tablet TAKE 1 TABLET(50 MG) BY MOUTH TWICE DAILY   No current facility-administered medications on file prior to visit.    Chief Complaint  Patient presents with  . Follow-up    Pt denies any current breathing issues. Pt seen in hospital since last OV for PNA   Tests Spirometry 01/10/09>>FEV1 0.97(52%), FEV1% 47 March 2012>>Daliresp started PSG 11/14/11 >> AHI 0.3 Echo 08/30/14 >> EF 60 to [id number], grade 1 diastolic dysfx, PAS 24 mmHG  Past medical history Spinal stenosis, GERD, Hypothyroidism, Depression, Anxiety  Past surgical history, Allergies, Family history, Social history reviewed.  Vital signs BP 108/70 mmHg  Pulse 84  Ht 5' (1.524 m)  Wt 141 lb 6.4 oz (64.139 kg)  BMI 27.62  kg/m2  SpO2 96%  History of Present Illness: Priscilla Cantrell is a 80 y.o. female former smoker with GOLD 4 COPD, ACE related cough, and hypoxemia on 2 liters oxygen with sleep.  Since I saw her last she was in hospital twice >> for pneumonia and then UTI/cellulitis.  Her chest xray from 02/20/2015 didn't show residual infiltrate.  Her breathing has been doing better.  She is not having cough, wheeze, or sputum.  She denies chest pain or leg swelling.  She keeps up with activities okay.  Her main difficulty getting around is related to her legs being different lengths since she had hip problem >> she sometimes has to balance herself by holding onto something.  She continues to use oxygen at ngiht.  She was quite depressed after her dog passed away.  She had to increase her antidepressant medication.  She is doing better recently, and was able to decrease medication dose.   Physical Exam:  General - Thin HEENT - No sinus tenderness, no oral exudate, no LAN Cardiac - s1s2 no murmur Chest - prolonged exhalation, no wheeze/rales Abdomen - soft, nontender Extremities - no edema Skin - no rashes Neurologic - normal strength Psychiatric - normal mood, behavior   Assessment/Plan:  COPD with chronic bronchitis and emphysema. Plan: - continue spiriva, advair, daliresp and prn proair  Chronic respiratory failure with hypoxia. Plan: - she is to continue supplemental oxygen at night  Upper airway cough syndrome. Plan: - prn zyrtec   Patient Instructions  Follow up in 6 months    Chesley Mires, MD [place] Pager:  [phone] 05/23/2015

## 2015-05-23 NOTE — Patient Instructions (Signed)
Follow up in 6 months 

## 2015-05-28 DIAGNOSIS — R293 Abnormal posture: Secondary | ICD-10-CM | POA: Diagnosis not present

## 2015-05-28 DIAGNOSIS — R2681 Unsteadiness on feet: Secondary | ICD-10-CM | POA: Diagnosis not present

## 2015-05-28 DIAGNOSIS — Z96641 Presence of right artificial hip joint: Secondary | ICD-10-CM | POA: Diagnosis not present

## 2015-05-30 ENCOUNTER — Encounter: Payer: Self-pay | Admitting: Family Medicine

## 2015-05-31 DIAGNOSIS — R2681 Unsteadiness on feet: Secondary | ICD-10-CM | POA: Diagnosis not present

## 2015-05-31 DIAGNOSIS — R293 Abnormal posture: Secondary | ICD-10-CM | POA: Diagnosis not present

## 2015-05-31 DIAGNOSIS — Z96641 Presence of right artificial hip joint: Secondary | ICD-10-CM | POA: Diagnosis not present

## 2015-06-04 ENCOUNTER — Ambulatory Visit (INDEPENDENT_AMBULATORY_CARE_PROVIDER_SITE_OTHER): Payer: Medicare Other | Admitting: Family Medicine

## 2015-06-04 ENCOUNTER — Encounter: Payer: Self-pay | Admitting: Family Medicine

## 2015-06-04 VITALS — BP 135/79 | HR 74 | Temp 98.0°F | Resp 16 | Ht 60.0 in | Wt 141.5 lb

## 2015-06-04 DIAGNOSIS — R159 Full incontinence of feces: Secondary | ICD-10-CM | POA: Diagnosis not present

## 2015-06-04 DIAGNOSIS — R2681 Unsteadiness on feet: Secondary | ICD-10-CM | POA: Diagnosis not present

## 2015-06-04 DIAGNOSIS — Z96641 Presence of right artificial hip joint: Secondary | ICD-10-CM | POA: Diagnosis not present

## 2015-06-04 DIAGNOSIS — R293 Abnormal posture: Secondary | ICD-10-CM | POA: Diagnosis not present

## 2015-06-04 NOTE — Progress Notes (Signed)
Pre visit review using our clinic review tool, if applicable. No additional management support is needed unless otherwise documented below in the visit note. 

## 2015-06-04 NOTE — Progress Notes (Signed)
OFFICE VISIT  06/04/2015   CC:  Chief Complaint  Priscilla Cantrell presents with  . Encopresis    x 2 weeks   HPI:    Priscilla Cantrell is a 80 y.o. Caucasian female who presents for about 2 week history of leakage of clear, thick liquid from anus. Pt says she doesn't realize it is happening.  The liquid is NOT c/w urine per pt.  Sometimes has specks of different colors in it.  No pain in anal area or abdomen except today she has noted some "uncomfortable feeling in R side of abdomen. She does have one dark brown formed but brittle BM per day.  She does feel like she has evacuated her rectum when she has a BM.  No stool softeners being taken.  She is taking an otc fiber supplement which has apparently not helped anything. No diarrhea BMs.    Past Medical History  Diagnosis Date  . Hypertension     EKG 03/2010 normal  . COPD (chronic obstructive pulmonary disease) ([place])     spirometry 01/10/09 FEV 0.97(52%), FEV1% 47  . Hypoxemia     O2 86% RA with exertion 01/10/09, 2 liters oxygen with exertion and sleep  . Pulmonary nodule     CT chest 12/10, June 2011.  No nodule on CT 06/2010.  Marland Kitchen Spinal stenosis     Symptomatic (neurogenic claudication)  . GERD (gastroesophageal reflux disease)   . Hypothyroidism     Hashimoto's, dx'd 1989  . Diverticulosis of colon     Procto '97 and colonoscopy 2002  . DDD (degenerative disc disease), cervical     1998 MRI C5-6 impingemt (left)  . DDD (degenerative disc disease), lumbar   . Spinal stenosis of lumbar region with neurogenic claudication     2008 MRI (L3-4, L4-5, L5-S1)  . Fibrocystic breast disease     w/fibroadenoma.  Bx's showed NO atypia (Dr. Margot Chimes).  Mammo neg 2008.  Marland Kitchen Chest pain, non-cardiac     Cardiac CT showed no signif obstructive dz, mildly elevated calcium level (Dr. [name]).  . History of double vision     Ophthalmologist, Dr. Herbert Deaner, is further evaluating this with MRI  orbits and limited brain (myesthenia gravis testing neg 07/2010)  .  Depression with anxiety 04/15/2011  . Asthma   . Dysplastic nevus of upper extremity 04/2014    R tricep ([name])  . Osteoarthritis, hip, bilateral     End stage; R THA  . History of home oxygen therapy     uses 2 liters at hs  . Family history of adverse reaction to anesthesia     mother died when Priscilla Cantrell age 76 receiving anesthesia for thyroid surgery  . Chronic hypoxemic respiratory failure ([place]) 02/02/2015    2L oxygen 24/7  . Shingles 02/2014    R abd/flank  . Right renal mass 2017    found on CT.  F/u MRI 03/31/15 showed it to be smaller and less likely of any significance but plan for repeat CT with renal protocol 6 mo per radiologist's recommendations.    Past Surgical History  Procedure Laterality Date  . Cholecystectomy  2001  . Tubal ligation  1974  . Abdominal hysterectomy  1978    no BSO per pt--nonmalignant reasons  . Breast cyst excision      Last screening mammogram 10/2010 was normal.  . Appendectomy    . Tonsillectomy    . Colonoscopy  04/2005    NORMAL-recall 10 yrs  . Transthoracic echocardiogram  08/2014    EF 60-65%, grade I diast dysfxn  . Total hip arthroplasty Right 11/01/2014    Procedure: RIGHT TOTAL HIP ARTHROPLASTY;  Surgeon: Latanya Maudlin, MD;  Location: [facility];  Service: Orthopedics;  Laterality: Right;    Outpatient Prescriptions Prior to Visit  Medication Sig Dispense Refill  . albuterol (PROAIR HFA) 108 (90 BASE) MCG/ACT inhaler Inhale 2 puffs into the lungs every 4 (four) hours as needed. (Priscilla Cantrell taking differently: Inhale 2 puffs into the lungs every 4 (four) hours as needed for wheezing or shortness of breath. ) 1 Inhaler 5  . aspirin 325 MG tablet Take 325 mg by mouth daily.    . cetirizine (ZYRTEC) 10 MG tablet Take 1 tablet (10 mg total) by mouth daily. 30 tablet 11  . DALIRESP 500 MCG TABS tablet TAKE 1 TABLET BY MOUTH DAILY 90 tablet 0  . diazepam (VALIUM) 5 MG tablet 1 tab po qAM and 2 tabs po qhs 90 tablet 0  . Fluticasone-Salmeterol  (ADVAIR DISKUS) 250-50 MCG/DOSE AEPB INHALE 1 PUFF INTO THE LUNGS TWICE DAILY 180 each 3  . omeprazole (PRILOSEC) 20 MG capsule Take 20 mg by mouth daily.      . RESTASIS 0.05 % ophthalmic emulsion Apply 1 drop to eye 2 (two) times daily.  4  . SPIRIVA HANDIHALER 18 MCG inhalation capsule INHALE 1 CAPSULE VIA HANDIHALER EVERY DAY AT THE SAME TIME 30 capsule 5  . SYNTHROID 88 MCG tablet TAKE 1 TABLET BY MOUTH DAILY 30 tablet 3  . venlafaxine (EFFEXOR) 50 MG tablet TAKE 1 TABLET(50 MG) BY MOUTH TWICE DAILY 60 tablet 1  . HYDROcodone-acetaminophen (NORCO/VICODIN) 5-325 MG tablet Take 1-2 tablets by mouth every 6 (six) hours as needed. (Priscilla Cantrell not taking: Reported on 06/04/2015) 30 tablet 0  . senna-docusate (SENOKOT-S) 8.6-50 MG tablet Take 2 tablets by mouth daily. (Priscilla Cantrell not taking: Reported on 06/04/2015) 30 tablet 0   No facility-administered medications prior to visit.    Allergies  Allergen Reactions  . Losartan Other (See Comments)    hyperkalemia  . Penicillins Hives, Swelling and Rash    Has Priscilla Cantrell had a PCN reaction causing immediate rash, facial/tongue/throat swelling, SOB or lightheadedness with hypotension: YES Has Priscilla Cantrell had a PCN reaction causing severe rash involving mucus membranes or skin necrosis: NO Has Priscilla Cantrell had a PCN reaction that required hospitalization NO Has Priscilla Cantrell had a PCN reaction occurring within the last 10 years: NO If all of the above answers are "NO", then may proceed with Cephalosporin use.   . Cefixime [Kdc:Cefixime] Other (See Comments)    unspecified  . Indocin [Indomethacin] Other (See Comments)    Painful tongue and throat  . Sulfa Antibiotics Other (See Comments)    unspecified  . Ace Inhibitors Other (See Comments)    cough  . Statins Other (See Comments)    Myalgias     ROS As per HPI  PE: Blood pressure 135/79, pulse 74, temperature 98 F (36.7 C), temperature source Oral, resp. rate 16, height 5' (1.524 m), weight 141 lb 8 oz  (64.184 kg), SpO2 94 %.  Pt examined with Etheleen Sia, nurse, as chaperone.  Gen: Alert, well appearing.  Priscilla Cantrell is oriented to person, place, time, and situation. AFFECT: pleasant, lucid thought and speech. ABD: soft, ND, mild TTP in RLQ--this pain does not change any when she does an abdominal crunch. BS +.  No bruit or mass. EXT: no clubbing, cyanosis, or edema.  LE strength 5/5 prox/dist bilat. Rectal: normal  rectal tone, no mass or stool palpable in vault.  No tenderness, no blood on glove.  LABS:  none  IMPRESSION AND PLAN:  Encopresis: she does not sound like she has been suffering from constipation, but will check KUB for stool burden. If constipation/excessive stool burden is not the issue, will refer Priscilla Cantrell to GI for further evaluation.  An After Visit Summary was printed and given to the Priscilla Cantrell.  Spent 25 min with pt today, with >50% of this time spent in counseling and care coordination regarding the above problems.  FOLLOW UP: Return if symptoms worsen or fail to improve.  Signed:  Crissie Sickles, MD           06/04/2015

## 2015-06-05 ENCOUNTER — Encounter: Payer: Self-pay | Admitting: Gastroenterology

## 2015-06-06 ENCOUNTER — Ambulatory Visit (HOSPITAL_BASED_OUTPATIENT_CLINIC_OR_DEPARTMENT_OTHER)
Admission: RE | Admit: 2015-06-06 | Discharge: 2015-06-06 | Disposition: A | Discharge status: Home / Self Care | Payer: Medicare Other | Source: Ambulatory Visit | Attending: Family Medicine | Admitting: Family Medicine

## 2015-06-06 DIAGNOSIS — R159 Full incontinence of feces: Secondary | ICD-10-CM | POA: Diagnosis not present

## 2015-06-06 DIAGNOSIS — R109 Unspecified abdominal pain: Secondary | ICD-10-CM | POA: Diagnosis not present

## 2015-06-07 DIAGNOSIS — Z96641 Presence of right artificial hip joint: Secondary | ICD-10-CM | POA: Diagnosis not present

## 2015-06-07 DIAGNOSIS — R293 Abnormal posture: Secondary | ICD-10-CM | POA: Diagnosis not present

## 2015-06-07 DIAGNOSIS — R2681 Unsteadiness on feet: Secondary | ICD-10-CM | POA: Diagnosis not present

## 2015-06-11 DIAGNOSIS — R2681 Unsteadiness on feet: Secondary | ICD-10-CM | POA: Diagnosis not present

## 2015-06-11 DIAGNOSIS — R293 Abnormal posture: Secondary | ICD-10-CM | POA: Diagnosis not present

## 2015-06-11 DIAGNOSIS — Z96641 Presence of right artificial hip joint: Secondary | ICD-10-CM | POA: Diagnosis not present

## 2015-06-14 ENCOUNTER — Other Ambulatory Visit (INDEPENDENT_AMBULATORY_CARE_PROVIDER_SITE_OTHER): Payer: Medicare Other

## 2015-06-14 DIAGNOSIS — R319 Hematuria, unspecified: Secondary | ICD-10-CM

## 2015-06-14 DIAGNOSIS — R2681 Unsteadiness on feet: Secondary | ICD-10-CM | POA: Diagnosis not present

## 2015-06-14 DIAGNOSIS — Z96641 Presence of right artificial hip joint: Secondary | ICD-10-CM | POA: Diagnosis not present

## 2015-06-14 DIAGNOSIS — R293 Abnormal posture: Secondary | ICD-10-CM | POA: Diagnosis not present

## 2015-06-14 LAB — URINALYSIS, ROUTINE W REFLEX MICROSCOPIC
Ketones, ur: NEGATIVE
Nitrite: NEGATIVE
Specific Gravity, Urine: 1.02 (ref 1.000–1.030)
Total Protein, Urine: >=300 — AB
Urine Glucose: NEGATIVE
Urobilinogen, UA: 0.2 (ref 0.0–1.0)
pH: 6.5 (ref 5.0–8.0)

## 2015-06-15 ENCOUNTER — Ambulatory Visit (INDEPENDENT_AMBULATORY_CARE_PROVIDER_SITE_OTHER): Payer: Medicare Other | Admitting: Family Medicine

## 2015-06-15 ENCOUNTER — Encounter: Payer: Self-pay | Admitting: Family Medicine

## 2015-06-15 VITALS — BP 134/75 | HR 84 | Temp 97.8°F | Resp 16 | Ht 60.0 in | Wt 142.5 lb

## 2015-06-15 DIAGNOSIS — M1000 Idiopathic gout, unspecified site: Secondary | ICD-10-CM

## 2015-06-15 DIAGNOSIS — N3001 Acute cystitis with hematuria: Secondary | ICD-10-CM | POA: Diagnosis not present

## 2015-06-15 LAB — URIC ACID: Uric Acid, Serum: 5.5 mg/dL (ref 2.4–7.0)

## 2015-06-15 MED ORDER — CIPROFLOXACIN HCL 500 MG PO TABS: 500.0000 mg | ORAL_TABLET | Freq: Two times a day (BID) | ORAL | Status: DC

## 2015-06-15 MED ORDER — PREDNISONE 20 MG PO TABS: ORAL_TABLET | ORAL | Status: DC

## 2015-06-15 NOTE — Progress Notes (Signed)
Pre visit review using our clinic review tool, if applicable. No additional management support is needed unless otherwise documented below in the visit note. 

## 2015-06-15 NOTE — Progress Notes (Signed)
OFFICE VISIT  06/15/2015   CC:  Chief Complaint  Patient presents with  . Ankle Pain    left x 2 days     HPI:    Patient is a 80 y.o. Caucasian female who presents for left ankle pain. Two nights ago woke up with L ankle hurting.  It had a little red area on medial ankle region and it hurt when she pressed on it. Seemed to have improve over the last 24h but still limping.  No fever, no feeling sick. She is in the midst of PT for hip and low back at this time.   Also with recent urinary complaints: dysuria, urinary urinary urgency, urinary frequency. She came in yesterday and left a urine sample--results below.   She is doing better last 24h on AZO (started this AFTER she gave urine sample). No abd pain.  No flank pain.  Past Medical History  Diagnosis Date  . Hypertension     EKG 03/2010 normal  . COPD (chronic obstructive pulmonary disease) ([place])     spirometry 01/10/09 FEV 0.97(52%), FEV1% 47  . Hypoxemia     O2 86% RA with exertion 01/10/09, 2 liters oxygen with exertion and sleep  . Pulmonary nodule     CT chest 12/10, June 2011.  No nodule on CT 06/2010.  Marland Kitchen Spinal stenosis     Symptomatic (neurogenic claudication)  . GERD (gastroesophageal reflux disease)   . Hypothyroidism     Hashimoto's, dx'd 1989  . Diverticulosis of colon     Procto '97 and colonoscopy 2002  . DDD (degenerative disc disease), cervical     1998 MRI C5-6 impingemt (left)  . DDD (degenerative disc disease), lumbar   . Spinal stenosis of lumbar region with neurogenic claudication     2008 MRI (L3-4, L4-5, L5-S1)  . Fibrocystic breast disease     w/fibroadenoma.  Bx's showed NO atypia (Dr. Margot Chimes).  Mammo neg 2008.  Marland Kitchen Chest pain, non-cardiac     Cardiac CT showed no signif obstructive dz, mildly elevated calcium level (Dr. [name]).  . History of double vision     Ophthalmologist, Dr. Herbert Deaner, is further evaluating this with MRI  orbits and limited brain (myesthenia gravis testing neg 07/2010)  .  Depression with anxiety 04/15/2011  . Asthma   . Dysplastic nevus of upper extremity 04/2014    R tricep ([name])  . Osteoarthritis, hip, bilateral     End stage; R THA  . History of home oxygen therapy     uses 2 liters at hs  . Family history of adverse reaction to anesthesia     mother died when patient age 45 receiving anesthesia for thyroid surgery  . Chronic hypoxemic respiratory failure ([place]) 02/02/2015    2L oxygen 24/7  . Shingles 02/2014    R abd/flank  . Right renal mass 2017    found on CT.  F/u MRI 03/31/15 showed it to be smaller and less likely of any significance but plan for repeat CT with renal protocol 6 mo per radiologist's recommendations.    Past Surgical History  Procedure Laterality Date  . Cholecystectomy  2001  . Tubal ligation  1974  . Abdominal hysterectomy  1978    no BSO per pt--nonmalignant reasons  . Breast cyst excision      Last screening mammogram 10/2010 was normal.  . Appendectomy    . Tonsillectomy    . Colonoscopy  04/2005    NORMAL-recall 10 yrs  .  Transthoracic echocardiogram  08/2014    EF 60-65%, grade I diast dysfxn  . Total hip arthroplasty Right 11/01/2014    Procedure: RIGHT TOTAL HIP ARTHROPLASTY;  Surgeon: Latanya Maudlin, MD;  Location: [facility];  Service: Orthopedics;  Laterality: Right;    Outpatient Prescriptions Prior to Visit  Medication Sig Dispense Refill  . albuterol (PROAIR HFA) 108 (90 BASE) MCG/ACT inhaler Inhale 2 puffs into the lungs every 4 (four) hours as needed. (Patient taking differently: Inhale 2 puffs into the lungs every 4 (four) hours as needed for wheezing or shortness of breath. ) 1 Inhaler 5  . aspirin 325 MG tablet Take 325 mg by mouth daily.    . cetirizine (ZYRTEC) 10 MG tablet Take 1 tablet (10 mg total) by mouth daily. 30 tablet 11  . DALIRESP 500 MCG TABS tablet TAKE 1 TABLET BY MOUTH DAILY 90 tablet 0  . diazepam (VALIUM) 5 MG tablet 1 tab po qAM and 2 tabs po qhs 90 tablet 0  . Fluticasone-Salmeterol  (ADVAIR DISKUS) 250-50 MCG/DOSE AEPB INHALE 1 PUFF INTO THE LUNGS TWICE DAILY 180 each 3  . omeprazole (PRILOSEC) 20 MG capsule Take 20 mg by mouth daily.      . RESTASIS 0.05 % ophthalmic emulsion Apply 1 drop to eye 2 (two) times daily.  4  . SPIRIVA HANDIHALER 18 MCG inhalation capsule INHALE 1 CAPSULE VIA HANDIHALER EVERY DAY AT THE SAME TIME 30 capsule 5  . SYNTHROID 88 MCG tablet TAKE 1 TABLET BY MOUTH DAILY 30 tablet 3  . venlafaxine (EFFEXOR) 50 MG tablet TAKE 1 TABLET(50 MG) BY MOUTH TWICE DAILY 60 tablet 1   No facility-administered medications prior to visit.    Allergies  Allergen Reactions  . Losartan Other (See Comments)    hyperkalemia  . Penicillins Hives, Swelling and Rash    Has patient had a PCN reaction causing immediate rash, facial/tongue/throat swelling, SOB or lightheadedness with hypotension: YES Has patient had a PCN reaction causing severe rash involving mucus membranes or skin necrosis: NO Has patient had a PCN reaction that required hospitalization NO Has patient had a PCN reaction occurring within the last 10 years: NO If all of the above answers are "NO", then may proceed with Cephalosporin use.   . Cefixime [Kdc:Cefixime] Other (See Comments)    unspecified  . Indocin [Indomethacin] Other (See Comments)    Painful tongue and throat  . Sulfa Antibiotics Other (See Comments)    unspecified  . Ace Inhibitors Other (See Comments)    cough  . Statins Other (See Comments)    Myalgias     ROS As per HPI  PE: Blood pressure 134/75, pulse 84, temperature 97.8 F (36.6 C), temperature source Oral, resp. rate 16, height 5' (1.524 m), weight 142 lb 8 oz (64.638 kg), SpO2 95 %. Gen: Alert, well appearing.  Patient is oriented to person, place, time, and situation. R foot w/out any swelling, erythema, warmth, or tenderness. L foot with medial foot soft tissue swelling--mild.  No signif warmth in the area but some tenderness is noted.  No erythema.    ROM of  ankle intact.  LABS:  CC UA 06/14/15: large blood and LEU, TNTC RBCs and LEUKs, 300 mg/dl protein, nitrite neg. Specimen sent for c/s.    Chemistry      Component Value Date/Time   NA 139 02/28/2015 1144   K 4.3 02/28/2015 1144   CL 103 02/28/2015 1144   CO2 27 02/28/2015 1144   BUN  13 02/28/2015 1144   CREATININE 0.72 02/28/2015 1144   CREATININE 0.68 06/02/2014 1518      Component Value Date/Time   CALCIUM 9.1 02/28/2015 1144   ALKPHOS 70 02/28/2015 1144   AST 13 02/28/2015 1144   ALT 11 02/28/2015 1144   BILITOT 0.3 02/28/2015 1144     Lab Results  Component Value Date   WBC 6.4 02/28/2015   HGB 11.8* 02/28/2015   HCT 36.8 02/28/2015   MCV 81.8 02/28/2015   PLT 351.0 02/28/2015    IMPRESSION AND PLAN:  1) L medial foot pain, suspicious for gouty arthritis.  She recalls having one prior episode about 30 yrs ago. Will treat with low dose prednisone: 20mg  qd x 3d--since she is already improved over the last 24h. Check baseline uric acid level today.  2) UTI: cipro 500mg  bid x 5d.  Need to recheck UA after treatment to see if blood clears.  An After Visit Summary was printed and given to the patient.  FOLLOW UP: Return for lab visit to give urine sample in 1 week (for UA with reflex micro).  Signed:  Crissie Sickles, MD           06/15/2015

## 2015-06-16 LAB — URINE CULTURE
Colony Count: NO GROWTH
Organism ID, Bacteria: NO GROWTH

## 2015-06-18 ENCOUNTER — Encounter: Payer: Self-pay | Admitting: Family Medicine

## 2015-06-18 ENCOUNTER — Ambulatory Visit (INDEPENDENT_AMBULATORY_CARE_PROVIDER_SITE_OTHER): Payer: Medicare Other | Admitting: Family Medicine

## 2015-06-18 VITALS — BP 162/92 | HR 67 | Temp 97.6°F | Resp 16 | Ht 60.0 in | Wt 141.8 lb

## 2015-06-18 DIAGNOSIS — E039 Hypothyroidism, unspecified: Secondary | ICD-10-CM

## 2015-06-18 DIAGNOSIS — F32A Depression, unspecified: Secondary | ICD-10-CM

## 2015-06-18 DIAGNOSIS — I10 Essential (primary) hypertension: Secondary | ICD-10-CM | POA: Diagnosis not present

## 2015-06-18 DIAGNOSIS — F419 Anxiety disorder, unspecified: Secondary | ICD-10-CM

## 2015-06-18 DIAGNOSIS — R310 Gross hematuria: Secondary | ICD-10-CM

## 2015-06-18 DIAGNOSIS — F329 Major depressive disorder, single episode, unspecified: Secondary | ICD-10-CM

## 2015-06-18 DIAGNOSIS — J438 Other emphysema: Secondary | ICD-10-CM | POA: Diagnosis not present

## 2015-06-18 DIAGNOSIS — F418 Other specified anxiety disorders: Secondary | ICD-10-CM

## 2015-06-18 LAB — LIPID PANEL
Cholesterol: 193 mg/dL (ref 0–200)
HDL: 61.3 mg/dL (ref >39.00)
LDL Cholesterol: 106 mg/dL — ABNORMAL HIGH (ref 0–99)
NonHDL: 131.26
Total CHOL/HDL Ratio: 3
Triglycerides: 124 mg/dL (ref 0.0–149.0)
VLDL: 24.8 mg/dL (ref 0.0–40.0)

## 2015-06-18 LAB — CBC WITH DIFFERENTIAL/PLATELET
Basophils Absolute: 0.1 10*3/uL (ref 0.0–0.1)
Basophils Relative: 0.5 % (ref 0.0–3.0)
Eosinophils Absolute: 0.2 10*3/uL (ref 0.0–0.7)
Eosinophils Relative: 1.6 % (ref 0.0–5.0)
HCT: 39.3 % (ref 36.0–46.0)
Hemoglobin: 12.9 g/dL (ref 12.0–15.0)
Lymphocytes Relative: 23 % (ref 12.0–46.0)
Lymphs Abs: 2.2 10*3/uL (ref 0.7–4.0)
MCHC: 32.9 g/dL (ref 30.0–36.0)
MCV: 84 fl (ref 78.0–100.0)
Monocytes Absolute: 0.9 10*3/uL (ref 0.1–1.0)
Monocytes Relative: 8.9 % (ref 3.0–12.0)
Neutro Abs: 6.3 10*3/uL (ref 1.4–7.7)
Neutrophils Relative %: 66 % (ref 43.0–77.0)
Platelets: 343 10*3/uL (ref 150.0–400.0)
RBC: 4.67 Mil/uL (ref 3.87–5.11)
RDW: 14.1 % (ref 11.5–15.5)
WBC: 9.6 10*3/uL (ref 4.0–10.5)

## 2015-06-18 LAB — POCT URINALYSIS DIPSTICK
Bilirubin, UA: NEGATIVE
Blood, UA: NEGATIVE
Glucose, UA: NEGATIVE
Ketones, UA: NEGATIVE
Leukocytes, UA: NEGATIVE
Nitrite, UA: NEGATIVE
Protein, UA: NEGATIVE
Spec Grav, UA: 1.02
Urobilinogen, UA: 0.2
pH, UA: 6

## 2015-06-18 LAB — BASIC METABOLIC PANEL
BUN: 17 mg/dL (ref 6–23)
CO2: 28 mEq/L (ref 19–32)
Calcium: 9.8 mg/dL (ref 8.4–10.5)
Chloride: 105 mEq/L (ref 96–112)
Creatinine, Ser: 0.8 mg/dL (ref 0.40–1.20)
GFR: 73.71 mL/min (ref >60.00)
Glucose, Bld: 66 mg/dL — ABNORMAL LOW (ref 70–99)
Potassium: 3.3 mEq/L — ABNORMAL LOW (ref 3.5–5.1)
Sodium: 144 mEq/L (ref 135–145)

## 2015-06-18 MED ORDER — AMLODIPINE BESYLATE 5 MG PO TABS: 5.0000 mg | ORAL_TABLET | Freq: Every day | ORAL | Status: DC

## 2015-06-18 NOTE — Progress Notes (Signed)
Pre visit review using our clinic review tool, if applicable. No additional management support is needed unless otherwise documented below in the visit note. 

## 2015-06-18 NOTE — Progress Notes (Signed)
OFFICE VISIT  06/18/2015   CC:  Chief Complaint  Patient presents with  . Follow-up    Pt is not fasting.     HPI:    Patient is a 80 y.o. Caucasian female who presents for f/u COPD, hypothyroidism, and anxiety/depression. COPD: bothered by outside pollen but not having any wheezing or SOB.  She has recent elevated bp at home: she checked bp b/c of feeling dizzy--she took her amlodipine pill two consecutive days. No dizziness today.    She takes her thyroid pill with her other pills, on empty stomach most of the time.  Mood and anxiety stable on venlafaxine 50mg  bid.  She is fasting today.  Says her recent urine sx's are improved: she has no dysuria.  She does have sense of having to urinate but has to sit there a minute before anything will flow.  No abnormal color of urine--"it is very light".   No hemorrhoid or anal pain, no vag d/c or bleeding. She describes her pain from last week again as being sharp in urethral area and much more intense than what she has ever had with a UTI.  She does recall some R flank pain radiating to groin in the time prior to her urethral pain.  Her left foot pain is gone now--she says the prednisone helped.    Past Medical History  Diagnosis Date  . Hypertension     EKG 03/2010 normal  . COPD (chronic obstructive pulmonary disease) ([place])     spirometry 01/10/09 FEV 0.97(52%), FEV1% 47  . Hypoxemia     O2 86% RA with exertion 01/10/09, 2 liters oxygen with exertion and sleep  . Pulmonary nodule     CT chest 12/10, June 2011.  No nodule on CT 06/2010.  Marland Kitchen Spinal stenosis     Symptomatic (neurogenic claudication)  . GERD (gastroesophageal reflux disease)   . Hypothyroidism     Hashimoto's, dx'd 1989  . Diverticulosis of colon     Procto '97 and colonoscopy 2002  . DDD (degenerative disc disease), cervical     1998 MRI C5-6 impingemt (left)  . DDD (degenerative disc disease), lumbar   . Spinal stenosis of lumbar region with neurogenic  claudication     2008 MRI (L3-4, L4-5, L5-S1)  . Fibrocystic breast disease     w/fibroadenoma.  Bx's showed NO atypia (Dr. Margot Chimes).  Mammo neg 2008.  Marland Kitchen Chest pain, non-cardiac     Cardiac CT showed no signif obstructive dz, mildly elevated calcium level (Dr. [name]).  . History of double vision     Ophthalmologist, Dr. Herbert Deaner, is further evaluating this with MRI  orbits and limited brain (myesthenia gravis testing neg 07/2010)  . Depression with anxiety 04/15/2011  . Asthma   . Dysplastic nevus of upper extremity 04/2014    R tricep ([name])  . Osteoarthritis, hip, bilateral     End stage; R THA  . History of home oxygen therapy     uses 2 liters at hs  . Family history of adverse reaction to anesthesia     mother died when patient age 5 receiving anesthesia for thyroid surgery  . Chronic hypoxemic respiratory failure ([place]) 02/02/2015    2L oxygen 24/7  . Shingles 02/2014    R abd/flank  . Right renal mass 2017    found on CT.  F/u MRI 03/31/15 showed it to be smaller and less likely of any significance but plan for repeat CT with renal protocol 6 mo  per radiologist's recommendations.    Past Surgical History  Procedure Laterality Date  . Cholecystectomy  2001  . Tubal ligation  1974  . Abdominal hysterectomy  1978    no BSO per pt--nonmalignant reasons  . Breast cyst excision      Last screening mammogram 10/2010 was normal.  . Appendectomy    . Tonsillectomy    . Colonoscopy  04/2005    NORMAL-recall 10 yrs  . Transthoracic echocardiogram  08/2014    EF 60-65%, grade I diast dysfxn  . Total hip arthroplasty Right 11/01/2014    Procedure: RIGHT TOTAL HIP ARTHROPLASTY;  Surgeon: Latanya Maudlin, MD;  Location: [facility];  Service: Orthopedics;  Laterality: Right;   MEDS: Not taking prednisone listed below Outpatient Prescriptions Prior to Visit  Medication Sig Dispense Refill  . albuterol (PROAIR HFA) 108 (90 BASE) MCG/ACT inhaler Inhale 2 puffs into the lungs every 4 (four)  hours as needed. (Patient taking differently: Inhale 2 puffs into the lungs every 4 (four) hours as needed for wheezing or shortness of breath. ) 1 Inhaler 5  . aspirin 325 MG tablet Take 325 mg by mouth daily.    . cetirizine (ZYRTEC) 10 MG tablet Take 1 tablet (10 mg total) by mouth daily. 30 tablet 11  . ciprofloxacin (CIPRO) 500 MG tablet Take 1 tablet (500 mg total) by mouth 2 (two) times daily. 10 tablet 0  . DALIRESP 500 MCG TABS tablet TAKE 1 TABLET BY MOUTH DAILY 90 tablet 0  . diazepam (VALIUM) 5 MG tablet 1 tab po qAM and 2 tabs po qhs 90 tablet 0  . Fluticasone-Salmeterol (ADVAIR DISKUS) 250-50 MCG/DOSE AEPB INHALE 1 PUFF INTO THE LUNGS TWICE DAILY 180 each 3  . omeprazole (PRILOSEC) 20 MG capsule Take 20 mg by mouth daily.      . RESTASIS 0.05 % ophthalmic emulsion Apply 1 drop to eye 2 (two) times daily.  4  . SPIRIVA HANDIHALER 18 MCG inhalation capsule INHALE 1 CAPSULE VIA HANDIHALER EVERY DAY AT THE SAME TIME 30 capsule 5  . SYNTHROID 88 MCG tablet TAKE 1 TABLET BY MOUTH DAILY 30 tablet 3  . venlafaxine (EFFEXOR) 50 MG tablet TAKE 1 TABLET(50 MG) BY MOUTH TWICE DAILY 60 tablet 1  . predniSONE (DELTASONE) 20 MG tablet 1 tab po qd x 3d (Patient not taking: Reported on 06/18/2015) 3 tablet 0   No facility-administered medications prior to visit.    Allergies  Allergen Reactions  . Losartan Other (See Comments)    hyperkalemia  . Penicillins Hives, Swelling and Rash    Has patient had a PCN reaction causing immediate rash, facial/tongue/throat swelling, SOB or lightheadedness with hypotension: YES Has patient had a PCN reaction causing severe rash involving mucus membranes or skin necrosis: NO Has patient had a PCN reaction that required hospitalization NO Has patient had a PCN reaction occurring within the last 10 years: NO If all of the above answers are "NO", then may proceed with Cephalosporin use.   . Cefixime [Kdc:Cefixime] Other (See Comments)    unspecified  .  Indocin [Indomethacin] Other (See Comments)    Painful tongue and throat  . Sulfa Antibiotics Other (See Comments)    unspecified  . Ace Inhibitors Other (See Comments)    cough  . Statins Other (See Comments)    Myalgias     ROS As per HPI  PE: Blood pressure 162/92, pulse 67, temperature 97.6 F (36.4 C), temperature source Oral, resp. rate 16, height 5' (1.524  m), weight 141 lb 12 oz (64.297 kg), SpO2 96 %. Room air.  Repeat bp 162/92. Gen: Alert, well appearing.  Patient is oriented to person, place, time, and situation. Mild R scapular region TTP. No flank tenderness.   Left medial foot w/out swelling or tenderness.  Mild pinkish hue over L MTP joint but no warmth. CV: RRR, no m/r/g.   LUNGS: CTA bilat, nonlabored resps, good aeration in all lung fields. EXT: no clubbing, cyanosis, or edema.    LABS:  Lab Results  Component Value Date   TSH 2.92 03/20/2015     Chemistry      Component Value Date/Time   NA 139 02/28/2015 1144   K 4.3 02/28/2015 1144   CL 103 02/28/2015 1144   CO2 27 02/28/2015 1144   BUN 13 02/28/2015 1144   CREATININE 0.72 02/28/2015 1144   CREATININE 0.68 06/02/2014 1518      Component Value Date/Time   CALCIUM 9.1 02/28/2015 1144   ALKPHOS 70 02/28/2015 1144   AST 13 02/28/2015 1144   ALT 11 02/28/2015 1144   BILITOT 0.3 02/28/2015 1144     Lab Results  Component Value Date   WBC 6.4 02/28/2015   HGB 11.8* 02/28/2015   HCT 36.8 02/28/2015   MCV 81.8 02/28/2015   PLT 351.0 02/28/2015   Lab Results  Component Value Date   CHOL 195 08/31/2013   HDL 62.50 08/31/2013   LDLCALC 106* 08/31/2013   LDLDIRECT 131.3 03/07/2010   TRIG 132.0 08/31/2013   CHOLHDL 3 08/31/2013   Lab Results  Component Value Date   LABURIC 5.5 06/15/2015   Lab Results  Component Value Date   HGBA1C 5.7 10/29/2011   IMPRESSION AND PLAN:  1) Essential HTN: restart amlodipine 5mg  qd. Check FLP today as well as BMET.  2) Recent uretheral pain with  gross hematuria: urine clx NEG. It sounds like she may have passed a stone (?).  Will recheck urine for blood today. No further imaging to be done at this time. (see recent CT abd/pelv and MRI abd done to further eval R renal mass).  3) COPD: The current medical regimen is effective;  continue present plan and medications.  4) Anxiety/depression: The current medical regimen is effective;  continue present plan and medications.  5) Hypothyrodism: TSH UTD.  The current medical regimen is effective;  continue present plan and medications.  An After Visit Summary was printed and given to the patient.  FOLLOW UP: Return in about 3 months (around 09/18/2015) for routine chronic illness f/u (30 min).  Signed:  Crissie Sickles, MD           06/18/2015

## 2015-06-19 DIAGNOSIS — R293 Abnormal posture: Secondary | ICD-10-CM | POA: Diagnosis not present

## 2015-06-19 DIAGNOSIS — R2681 Unsteadiness on feet: Secondary | ICD-10-CM | POA: Diagnosis not present

## 2015-06-19 DIAGNOSIS — Z96641 Presence of right artificial hip joint: Secondary | ICD-10-CM | POA: Diagnosis not present

## 2015-06-22 DIAGNOSIS — Z96641 Presence of right artificial hip joint: Secondary | ICD-10-CM | POA: Diagnosis not present

## 2015-06-22 DIAGNOSIS — R2681 Unsteadiness on feet: Secondary | ICD-10-CM | POA: Diagnosis not present

## 2015-06-22 DIAGNOSIS — R293 Abnormal posture: Secondary | ICD-10-CM | POA: Diagnosis not present

## 2015-06-23 ENCOUNTER — Other Ambulatory Visit: Payer: Self-pay | Admitting: Nurse Practitioner

## 2015-06-26 DIAGNOSIS — R2681 Unsteadiness on feet: Secondary | ICD-10-CM | POA: Diagnosis not present

## 2015-06-26 DIAGNOSIS — Z96641 Presence of right artificial hip joint: Secondary | ICD-10-CM | POA: Diagnosis not present

## 2015-06-26 DIAGNOSIS — R293 Abnormal posture: Secondary | ICD-10-CM | POA: Diagnosis not present

## 2015-06-28 DIAGNOSIS — R2681 Unsteadiness on feet: Secondary | ICD-10-CM | POA: Diagnosis not present

## 2015-06-28 DIAGNOSIS — Z96641 Presence of right artificial hip joint: Secondary | ICD-10-CM | POA: Diagnosis not present

## 2015-06-28 DIAGNOSIS — R293 Abnormal posture: Secondary | ICD-10-CM | POA: Diagnosis not present

## 2015-07-01 ENCOUNTER — Emergency Department (HOSPITAL_BASED_OUTPATIENT_CLINIC_OR_DEPARTMENT_OTHER): Payer: Medicare Other

## 2015-07-01 ENCOUNTER — Encounter (HOSPITAL_BASED_OUTPATIENT_CLINIC_OR_DEPARTMENT_OTHER): Payer: Self-pay | Admitting: Emergency Medicine

## 2015-07-01 ENCOUNTER — Emergency Department (HOSPITAL_BASED_OUTPATIENT_CLINIC_OR_DEPARTMENT_OTHER)
Admission: EM | Admit: 2015-07-01 | Discharge: 2015-07-01 | Disposition: A | Discharge status: Home / Self Care | Payer: Medicare Other | Attending: Emergency Medicine | Admitting: Emergency Medicine

## 2015-07-01 DIAGNOSIS — Z7982 Long term (current) use of aspirin: Secondary | ICD-10-CM | POA: Diagnosis not present

## 2015-07-01 DIAGNOSIS — F329 Major depressive disorder, single episode, unspecified: Secondary | ICD-10-CM | POA: Insufficient documentation

## 2015-07-01 DIAGNOSIS — Z87891 Personal history of nicotine dependence: Secondary | ICD-10-CM | POA: Insufficient documentation

## 2015-07-01 DIAGNOSIS — Z79899 Other long term (current) drug therapy: Secondary | ICD-10-CM | POA: Insufficient documentation

## 2015-07-01 DIAGNOSIS — E039 Hypothyroidism, unspecified: Secondary | ICD-10-CM | POA: Diagnosis not present

## 2015-07-01 DIAGNOSIS — J449 Chronic obstructive pulmonary disease, unspecified: Secondary | ICD-10-CM | POA: Insufficient documentation

## 2015-07-01 DIAGNOSIS — K6389 Other specified diseases of intestine: Secondary | ICD-10-CM | POA: Diagnosis not present

## 2015-07-01 DIAGNOSIS — R1030 Lower abdominal pain, unspecified: Secondary | ICD-10-CM | POA: Diagnosis present

## 2015-07-01 DIAGNOSIS — K529 Noninfective gastroenteritis and colitis, unspecified: Secondary | ICD-10-CM

## 2015-07-01 DIAGNOSIS — I10 Essential (primary) hypertension: Secondary | ICD-10-CM | POA: Insufficient documentation

## 2015-07-01 LAB — CBC
HCT: 37.4 % (ref 36.0–46.0)
Hemoglobin: 12.2 g/dL (ref 12.0–15.0)
MCH: 27.7 pg (ref 26.0–34.0)
MCHC: 32.6 g/dL (ref 30.0–36.0)
MCV: 85 fL (ref 78.0–100.0)
Platelets: 294 10*3/uL (ref 150–400)
RBC: 4.4 MIL/uL (ref 3.87–5.11)
RDW: 14.3 % (ref 11.5–15.5)
WBC: 15.1 10*3/uL — ABNORMAL HIGH (ref 4.0–10.5)

## 2015-07-01 LAB — COMPREHENSIVE METABOLIC PANEL
ALT: 9 U/L — ABNORMAL LOW (ref 14–54)
AST: 16 U/L (ref 15–41)
Albumin: 3.6 g/dL (ref 3.5–5.0)
Alkaline Phosphatase: 66 U/L (ref 38–126)
Anion gap: 9 (ref 5–15)
BUN: 13 mg/dL (ref 6–20)
CO2: 23 mmol/L (ref 22–32)
Calcium: 9.1 mg/dL (ref 8.9–10.3)
Chloride: 106 mmol/L (ref 101–111)
Creatinine, Ser: 0.8 mg/dL (ref 0.44–1.00)
GFR calc Af Amer: >60 mL/min (ref >60)
GFR calc non Af Amer: >60 mL/min (ref >60)
Glucose, Bld: 107 mg/dL — ABNORMAL HIGH (ref 65–99)
Potassium: 3.2 mmol/L — ABNORMAL LOW (ref 3.5–5.1)
Sodium: 138 mmol/L (ref 135–145)
Total Bilirubin: 0.7 mg/dL (ref 0.3–1.2)
Total Protein: 6.7 g/dL (ref 6.5–8.1)

## 2015-07-01 LAB — LIPASE, BLOOD: Lipase: 14 U/L (ref 11–51)

## 2015-07-01 MED ORDER — IOPAMIDOL (ISOVUE-300) INJECTION 61%
100.0000 mL | Freq: Once | INTRAVENOUS | Status: AC | PRN
  Administered 2015-07-01: 100 mL via INTRAVENOUS

## 2015-07-01 MED ORDER — SODIUM CHLORIDE 0.9 % IV BOLUS (SEPSIS)
500.0000 mL | Freq: Once | INTRAVENOUS | Status: AC
  Administered 2015-07-01: 500 mL via INTRAVENOUS

## 2015-07-01 MED ORDER — METRONIDAZOLE 500 MG PO TABS: 500.0000 mg | ORAL_TABLET | Freq: Three times a day (TID) | ORAL | Status: DC

## 2015-07-01 MED ORDER — POTASSIUM CHLORIDE CRYS ER 20 MEQ PO TBCR
40.0000 meq | EXTENDED_RELEASE_TABLET | Freq: Once | ORAL | Status: AC
  Administered 2015-07-01: 40 meq via ORAL
  Filled 2015-07-01: qty 2

## 2015-07-01 NOTE — ED Provider Notes (Signed)
CSN: [id number]     Arrival date & time 07/01/15  1653 History  By signing my name below, I, Soijett Blue, attest that this documentation has been prepared under the direction and in the presence of Malvin Johns, MD. Electronically Signed: Soijett Blue, ED Scribe. 07/01/2015. 5:19 PM.   Chief Complaint  Patient presents with  . Abdominal Pain      The history is provided by the patient. No language interpreter was used.    HPI Comments: Priscilla Cantrell is a 80 y.o. female who presents to the Emergency Department complaining of mild lower abdominal pain onset 4 days. Pt states that she was seen by her PCP on 06/15/2015 for similar symptoms with dysuria and Rx cipro for cystitis.  She currently is having watery, nonbloody stools several times per day.  She was referred to a GI specialist for some intermittent diarrhea and her appointment is in June. Pt reports that this is the 3rd-4th time that these symptoms have occurred and she was seen by her PCP and her symptoms resolved with prescriptions for antidiarrheal meds. Pt denies ever having C. Diff. She states that she is having associated symptoms of loose mucous stools x 4 days and resolved fever of 100.2 yesterday. She states that she has tried wearing depends and imodium with mild relief for her symptoms. She denies n/v, difficulty urinating, cough, cold symptoms, and any other symptoms.   Past Medical History  Diagnosis Date  . Hypertension     EKG 03/2010 normal  . COPD (chronic obstructive pulmonary disease) ([place])     spirometry 01/10/09 FEV 0.97(52%), FEV1% 47  . Hypoxemia     O2 86% RA with exertion 01/10/09, 2 liters oxygen with exertion and sleep  . Pulmonary nodule     CT chest 12/10, June 2011.  No nodule on CT 06/2010.  Marland Kitchen Spinal stenosis     Symptomatic (neurogenic claudication)  . GERD (gastroesophageal reflux disease)   . Hypothyroidism     Hashimoto's, dx'd 1989  . Diverticulosis of colon     Procto '97 and colonoscopy  2002  . DDD (degenerative disc disease), cervical     1998 MRI C5-6 impingemt (left)  . DDD (degenerative disc disease), lumbar   . Spinal stenosis of lumbar region with neurogenic claudication     2008 MRI (L3-4, L4-5, L5-S1)  . Fibrocystic breast disease     w/fibroadenoma.  Bx's showed NO atypia (Dr. Margot Chimes).  Mammo neg 2008.  Marland Kitchen Chest pain, non-cardiac     Cardiac CT showed no signif obstructive dz, mildly elevated calcium level (Dr. [name]).  . History of double vision     Ophthalmologist, Dr. Herbert Deaner, is further evaluating this with MRI  orbits and limited brain (myesthenia gravis testing neg 07/2010)  . Depression with anxiety 04/15/2011  . Asthma   . Dysplastic nevus of upper extremity 04/2014    R tricep ([name])  . Osteoarthritis, hip, bilateral     End stage; R THA  . History of home oxygen therapy     uses 2 liters at hs  . Family history of adverse reaction to anesthesia     mother died when patient age 43 receiving anesthesia for thyroid surgery  . Chronic hypoxemic respiratory failure ([place]) 02/02/2015    2L oxygen 24/7  . Shingles 02/2014    R abd/flank  . Right renal mass 2017    found on CT.  F/u MRI 03/31/15 showed it to be smaller and less likely  of any significance but plan for repeat CT with renal protocol 6 mo per radiologist's recommendations.   Past Surgical History  Procedure Laterality Date  . Cholecystectomy  2001  . Tubal ligation  1974  . Abdominal hysterectomy  1978    no BSO per pt--nonmalignant reasons  . Breast cyst excision      Last screening mammogram 10/2010 was normal.  . Appendectomy    . Tonsillectomy    . Colonoscopy  04/2005    NORMAL-recall 10 yrs  . Transthoracic echocardiogram  08/2014    EF 60-65%, grade I diast dysfxn  . Total hip arthroplasty Right 11/01/2014    Procedure: RIGHT TOTAL HIP ARTHROPLASTY;  Surgeon: Latanya Maudlin, MD;  Location: [facility];  Service: Orthopedics;  Laterality: Right;   Family History  Problem Relation  Age of Onset  . Goiter Mother   . Psoriasis Father     od liver  . Diabetes Daughter     Type 2  . Cancer Paternal Aunt     stomach?  . Stroke Maternal Grandfather   . Stroke Paternal Grandmother   . Hypertension Paternal Grandmother   . Hernia Paternal Grandmother   . Cancer Paternal Grandfather     lung  . Cirrhosis Father    Social History  Substance Use Topics  . Smoking status: Former Smoker -- 2.00 packs/day for 35 years    Types: Cigarettes    Quit date: 02/03/1990  . Smokeless tobacco: Never Used  . Alcohol Use: Yes     Comment: Rarely   OB History    No data available     Review of Systems  Constitutional: Positive for fever (resolved). Negative for chills, diaphoresis and fatigue.  HENT: Negative for congestion, rhinorrhea and sneezing.   Eyes: Negative.   Respiratory: Negative for cough, chest tightness and shortness of breath.   Cardiovascular: Negative for chest pain and leg swelling.  Gastrointestinal: Positive for abdominal pain and diarrhea. Negative for nausea, vomiting and blood in stool.  Genitourinary: Negative for frequency, hematuria, flank pain and difficulty urinating.  Musculoskeletal: Negative for back pain and arthralgias.  Skin: Negative for rash.  Neurological: Negative for dizziness, speech difficulty, weakness, numbness and headaches.      Allergies  Losartan; Penicillins; Cefixime; Indocin; Sulfa antibiotics; Ace inhibitors; and Statins  Home Medications   Prior to Admission medications   Medication Sig Start Date End Date Taking? Authorizing Provider  albuterol (PROAIR HFA) 108 (90 BASE) MCG/ACT inhaler Inhale 2 puffs into the lungs every 4 (four) hours as needed. Patient taking differently: Inhale 2 puffs into the lungs every 4 (four) hours as needed for wheezing or shortness of breath.  10/07/13   [name], MD  amLODipine (NORVASC) 5 MG tablet Take 1 tablet (5 mg total) by mouth daily. 06/18/15   [name], MD   aspirin 325 MG tablet Take 325 mg by mouth daily.    Historical Provider, MD  cetirizine (ZYRTEC) 10 MG tablet Take 1 tablet (10 mg total) by mouth daily. 10/11/14   [name], MD  ciprofloxacin (CIPRO) 500 MG tablet Take 1 tablet (500 mg total) by mouth 2 (two) times daily. 06/15/15   [name], MD  DALIRESP 500 MCG TABS tablet TAKE 1 TABLET BY MOUTH DAILY 05/01/15   Chesley Mires, MD  diazepam (VALIUM) 5 MG tablet 1 tab po qAM and 2 tabs po qhs 04/18/15   [name], MD  Fluticasone-Salmeterol (ADVAIR DISKUS) 250-50 MCG/DOSE AEPB  INHALE 1 PUFF INTO THE LUNGS TWICE DAILY 03/21/15   Chesley Mires, MD  metroNIDAZOLE (FLAGYL) 500 MG tablet Take 1 tablet (500 mg total) by mouth 3 (three) times daily. One po bid x 7 days 07/01/15   Malvin Johns, MD  omeprazole (PRILOSEC) 20 MG capsule Take 20 mg by mouth daily.      Historical Provider, MD  RESTASIS 0.05 % ophthalmic emulsion Apply 1 drop to eye 2 (two) times daily. 03/14/14   Historical Provider, MD  SPIRIVA HANDIHALER 18 MCG inhalation capsule INHALE 1 CAPSULE VIA HANDIHALER EVERY DAY AT THE SAME TIME 12/20/14   Chesley Mires, MD  SYNTHROID 88 MCG tablet TAKE 1 TABLET BY MOUTH DAILY 03/13/15   [name], MD  venlafaxine (EFFEXOR) 50 MG tablet TAKE 1 TABLET(50 MG) BY MOUTH TWICE DAILY 03/02/15   [name], MD   BP 126/71 mmHg  Pulse 83  Temp(Src) 98.7 F (37.1 C) (Oral)  Resp 18  Ht 5' (1.524 m)  Wt 141 lb (63.957 kg)  BMI 27.54 kg/m2  SpO2 95% Physical Exam  Constitutional: She is oriented to person, place, and time. She appears well-developed and well-nourished.  HENT:  Head: Normocephalic and atraumatic.  Eyes: Pupils are equal, round, and reactive to light.  Neck: Normal range of motion. Neck supple.  Cardiovascular: Normal rate, regular rhythm and normal heart sounds.  Exam reveals no gallop and no friction rub.   No murmur heard. Pulmonary/Chest: Effort normal and breath sounds normal. No respiratory distress. She  has no wheezes. She has no rales. She exhibits no tenderness.  Abdominal: Soft. Bowel sounds are normal. There is no tenderness. There is no rebound and no guarding.  Musculoskeletal: Normal range of motion. She exhibits no edema.  Lymphadenopathy:    She has no cervical adenopathy.  Neurological: She is alert and oriented to person, place, and time.  Skin: Skin is warm and dry. No rash noted.  Psychiatric: She has a normal mood and affect.  Nursing note and vitals reviewed.   ED Course  Procedures (including critical care time) DIAGNOSTIC STUDIES: Oxygen Saturation is 97% on RA, nl by my interpretation.    COORDINATION OF CARE: 5:16 PM Discussed treatment plan with pt at bedside which includes labs and CT abdomen pelvis with contrast and pt agreed to plan.    Labs Review Results for orders placed or performed during the hospital encounter of 07/01/15  Lipase, blood  Result Value Ref Range   Lipase 14 11 - 51 U/L  Comprehensive metabolic panel  Result Value Ref Range   Sodium 138 135 - 145 mmol/L   Potassium 3.2 (L) 3.5 - 5.1 mmol/L   Chloride 106 101 - 111 mmol/L   CO2 23 22 - 32 mmol/L   Glucose, Bld 107 (H) 65 - 99 mg/dL   BUN 13 6 - 20 mg/dL   Creatinine, Ser 0.80 0.44 - 1.00 mg/dL   Calcium 9.1 8.9 - 10.3 mg/dL   Total Protein 6.7 6.5 - 8.1 g/dL   Albumin 3.6 3.5 - 5.0 g/dL   AST 16 15 - 41 U/L   ALT 9 (L) 14 - 54 U/L   Alkaline Phosphatase 66 38 - 126 U/L   Total Bilirubin 0.7 0.3 - 1.2 mg/dL   GFR calc non Af Amer >60 >60 mL/min   GFR calc Af Amer >60 >60 mL/min   Anion gap 9 5 - 15  CBC  Result Value Ref Range   WBC 15.1 (H)  4.0 - 10.5 K/uL   RBC 4.40 3.87 - 5.11 MIL/uL   Hemoglobin 12.2 12.0 - 15.0 g/dL   HCT 37.4 36.0 - 46.0 %   MCV 85.0 78.0 - 100.0 fL   MCH 27.7 26.0 - 34.0 pg   MCHC 32.6 30.0 - 36.0 g/dL   RDW 14.3 11.5 - 15.5 %   Platelets 294 150 - 400 K/uL   Dg Abd 1 View  06/06/2015  CLINICAL DATA:  Anal leakage that she states is not diarhhea  x 2 weeks. Pt denies pain or nausea or vomiting. She states she had shingles a couple of months ago but finished up her antibiotics x 1 month ago. EXAM: ABDOMEN - 1 VIEW COMPARISON:  None. FINDINGS: There is no bowel dilatation to suggest obstruction. There is no evidence of pneumoperitoneum, portal venous gas or pneumatosis. There are no pathologic calcifications along the expected course of the ureters. There is a right total hip arthroplasty. There is moderate osteoarthritis of the left hip. There is lumbar spine spondylosis. IMPRESSION: Unremarkable KUB. Electronically Signed   By: Kathreen Devoid   On: 06/06/2015 16:07   Ct Abdomen Pelvis W Contrast  07/01/2015  CLINICAL DATA:  Acute onset of lower abdominal pain and diarrhea. Leukocytosis. Low-grade fever. Initial encounter. EXAM: CT ABDOMEN AND PELVIS WITH CONTRAST TECHNIQUE: Multidetector CT imaging of the abdomen and pelvis was performed using the standard protocol following bolus administration of intravenous contrast. CONTRAST:  174mL ISOVUE-300 IOPAMIDOL (ISOVUE-300) INJECTION 61% COMPARISON:  CT of the abdomen and pelvis performed 02/19/2015, and MRA of the abdomen performed 03/31/2015 FINDINGS: Minimal bibasilar atelectasis or scarring is noted. Relatively stable hypodensities within the liver measure up to 9 mm in size. The liver and spleen are otherwise unremarkable. The patient is status post cholecystectomy, with clips noted at the gallbladder fossa. The pancreas and adrenal glands are unremarkable. The kidneys are unremarkable in appearance. There is no evidence of hydronephrosis. No renal or ureteral stones are seen. No perinephric stranding is appreciated. No free fluid is identified. The small bowel is unremarkable in appearance. The stomach is within normal limits. No acute vascular abnormalities are seen. Mild calcification is seen along the abdominal aorta and its branches. The patient is status post appendectomy. Contrast progresses to the  level of the descending colon. There is mild wall thickening along the sigmoid colon, which could reflect a mild infectious or inflammatory process. The bladder is moderately distended. A tiny air bubble is noted within the bladder. Would correlate for recent Foley catheter placement. The patient is status post hysterectomy. No suspicious adnexal masses are seen. No inguinal lymphadenopathy is seen. No acute osseous abnormalities are identified. The patient's right hip total arthroplasty is grossly unremarkable in appearance. There is chronic osseous fusion at L4-L5, with underlying facet disease. IMPRESSION: 1. Mild wall thickening along the sigmoid colon could reflect a mild infectious or inflammatory process. 2. Tiny air bubble noted within the bladder. Would correlate for recent Foley catheter placement. 3. Small nonspecific hypodensities within the liver measure up to 9 mm in size, relatively stable in appearance. 4. Mild calcification along the abdominal aorta and its branches. Electronically Signed   By: Garald Balding M.D.   On: 07/01/2015 19:53      Imaging Review Ct Abdomen Pelvis W Contrast  07/01/2015  CLINICAL DATA:  Acute onset of lower abdominal pain and diarrhea. Leukocytosis. Low-grade fever. Initial encounter. EXAM: CT ABDOMEN AND PELVIS WITH CONTRAST TECHNIQUE: Multidetector CT imaging of the abdomen and  pelvis was performed using the standard protocol following bolus administration of intravenous contrast. CONTRAST:  141mL ISOVUE-300 IOPAMIDOL (ISOVUE-300) INJECTION 61% COMPARISON:  CT of the abdomen and pelvis performed 02/19/2015, and MRA of the abdomen performed 03/31/2015 FINDINGS: Minimal bibasilar atelectasis or scarring is noted. Relatively stable hypodensities within the liver measure up to 9 mm in size. The liver and spleen are otherwise unremarkable. The patient is status post cholecystectomy, with clips noted at the gallbladder fossa. The pancreas and adrenal glands are  unremarkable. The kidneys are unremarkable in appearance. There is no evidence of hydronephrosis. No renal or ureteral stones are seen. No perinephric stranding is appreciated. No free fluid is identified. The small bowel is unremarkable in appearance. The stomach is within normal limits. No acute vascular abnormalities are seen. Mild calcification is seen along the abdominal aorta and its branches. The patient is status post appendectomy. Contrast progresses to the level of the descending colon. There is mild wall thickening along the sigmoid colon, which could reflect a mild infectious or inflammatory process. The bladder is moderately distended. A tiny air bubble is noted within the bladder. Would correlate for recent Foley catheter placement. The patient is status post hysterectomy. No suspicious adnexal masses are seen. No inguinal lymphadenopathy is seen. No acute osseous abnormalities are identified. The patient's right hip total arthroplasty is grossly unremarkable in appearance. There is chronic osseous fusion at L4-L5, with underlying facet disease. IMPRESSION: 1. Mild wall thickening along the sigmoid colon could reflect a mild infectious or inflammatory process. 2. Tiny air bubble noted within the bladder. Would correlate for recent Foley catheter placement. 3. Small nonspecific hypodensities within the liver measure up to 9 mm in size, relatively stable in appearance. 4. Mild calcification along the abdominal aorta and its branches. Electronically Signed   By: Garald Balding M.D.   On: 07/01/2015 19:53   I have personally reviewed and evaluated these images and lab results as part of my medical decision-making.   EKG Interpretation None      MDM   Final diagnoses:  Colitis    PT presents with diarrhea.  Has had abx use in last 3 weeks.  CT shows colitis.  PT Otherwise well-appearing. Her vital signs are stable with no hypotension. She has no vomiting and feels better in the ED. She's  ready to go home. I feel that she is at risk for C. difficile. Stool studies and C. difficile testing was sent. Due to the colitis on CT, I will start her on Flagyl. I encouraged her to follow-up with her PCP within the next few days. She was advised to use a clear liquid diet. Return precautions were given.  I personally performed the services described in this documentation, which was scribed in my presence.  The recorded information has been reviewed and considered.    Malvin Johns, MD 07/01/15 2100

## 2015-07-01 NOTE — Discharge Instructions (Signed)
°  Colitis °Colitis is inflammation of the colon. Colitis may last a short time (acute) or it may last a long time (chronic). °CAUSES °This condition may be caused by: °· Viruses. °· Bacteria. °· Reactions to medicine. °· Certain autoimmune diseases, such as Crohn disease or ulcerative colitis. °SYMPTOMS °Symptoms of this condition include: °· Diarrhea. °· Passing bloody or tarry stool. °· Pain. °· Fever. °· Vomiting. °· Tiredness (fatigue). °· Weight loss. °· Bloating. °· Sudden increase in abdominal pain. °· Having fewer bowel movements than usual. °DIAGNOSIS °This condition is diagnosed with a stool test or a blood test. You may also have other tests, including X-rays, a CT scan, or a colonoscopy. °TREATMENT °Treatment may include: °· Resting the bowel. This involves not eating or drinking for a period of time. °· Fluids that are given through an IV tube. °· Medicine for pain and diarrhea. °· Antibiotic medicines. °· Cortisone medicines. °· Surgery. °HOME CARE INSTRUCTIONS °Eating and Drinking °· Follow instructions from your health care provider about eating or drinking restrictions. °· Drink enough fluid to keep your urine clear or pale yellow. °· Work with a dietitian to determine which foods cause your condition to flare up. °· Avoid foods that cause flare-ups. °· Eat a well-balanced diet. °Medicines °· Take over-the-counter and prescription medicines only as told by your health care provider. °· If you were prescribed an antibiotic medicine, take it as told by your health care provider. Do not stop taking the antibiotic even if you start to feel better. °General Instructions °· Keep all follow-up visits as told by your health care provider. This is important. °SEEK MEDICAL CARE IF: °· Your symptoms do not go away. °· You develop new symptoms. °SEEK IMMEDIATE MEDICAL CARE IF: °· You have a fever that does not go away with treatment. °· You develop chills. °· You have extreme weakness, fainting, or  dehydration. °· You have repeated vomiting. °· You develop severe pain in your abdomen. °· You pass bloody or tarry stool. °  °This information is not intended to replace advice given to you by your health care provider. Make sure you discuss any questions you have with your health care provider. °  °Document Released: 02/28/2004 Document Revised: 10/11/2014 Document Reviewed: 05/15/2014 °Elsevier Interactive Patient Education ©2016 [employer] ° °

## 2015-07-01 NOTE — ED Notes (Signed)
Pt c/o frequent and soft "mucous stools" since Thursday, along with mild lower abdominal pain.

## 2015-07-01 NOTE — ED Notes (Signed)
Patient given nasal cannula and put on oxygen 2Lpm

## 2015-07-02 LAB — GASTROINTESTINAL PANEL BY PCR, STOOL (REPLACES STOOL CULTURE)

## 2015-07-02 LAB — C DIFFICILE QUICK SCREEN W PCR REFLEX
C Diff antigen: POSITIVE — AB
C Diff toxin: NEGATIVE

## 2015-07-04 ENCOUNTER — Encounter: Payer: Self-pay | Admitting: Family Medicine

## 2015-07-04 ENCOUNTER — Ambulatory Visit (INDEPENDENT_AMBULATORY_CARE_PROVIDER_SITE_OTHER): Payer: Medicare Other | Admitting: Family Medicine

## 2015-07-04 VITALS — BP 117/76 | HR 83 | Temp 97.6°F | Resp 16 | Ht 60.0 in | Wt 140.8 lb

## 2015-07-04 DIAGNOSIS — D72829 Elevated white blood cell count, unspecified: Secondary | ICD-10-CM | POA: Diagnosis not present

## 2015-07-04 DIAGNOSIS — A047 Enterocolitis due to Clostridium difficile: Secondary | ICD-10-CM | POA: Diagnosis not present

## 2015-07-04 DIAGNOSIS — E876 Hypokalemia: Secondary | ICD-10-CM

## 2015-07-04 DIAGNOSIS — A0472 Enterocolitis due to Clostridium difficile, not specified as recurrent: Secondary | ICD-10-CM

## 2015-07-04 LAB — CBC WITH DIFFERENTIAL/PLATELET
Basophils Absolute: 0 10*3/uL (ref 0.0–0.1)
Basophils Relative: 0.3 % (ref 0.0–3.0)
Eosinophils Absolute: 0.2 10*3/uL (ref 0.0–0.7)
Eosinophils Relative: 2 % (ref 0.0–5.0)
HCT: 37.2 % (ref 36.0–46.0)
Hemoglobin: 12.1 g/dL (ref 12.0–15.0)
Lymphocytes Relative: 17.1 % (ref 12.0–46.0)
Lymphs Abs: 1.7 10*3/uL (ref 0.7–4.0)
MCHC: 32.5 g/dL (ref 30.0–36.0)
MCV: 82.9 fl (ref 78.0–100.0)
Monocytes Absolute: 0.9 10*3/uL (ref 0.1–1.0)
Monocytes Relative: 9 % (ref 3.0–12.0)
Neutro Abs: 7.2 10*3/uL (ref 1.4–7.7)
Neutrophils Relative %: 71.6 % (ref 43.0–77.0)
Platelets: 356 10*3/uL (ref 150.0–400.0)
RBC: 4.48 Mil/uL (ref 3.87–5.11)
RDW: 14 % (ref 11.5–15.5)
WBC: 10 10*3/uL (ref 4.0–10.5)

## 2015-07-04 LAB — BASIC METABOLIC PANEL
BUN: 10 mg/dL (ref 6–23)
CO2: 25 mEq/L (ref 19–32)
Calcium: 9.5 mg/dL (ref 8.4–10.5)
Chloride: 105 mEq/L (ref 96–112)
Creatinine, Ser: 0.77 mg/dL (ref 0.40–1.20)
GFR: 77.02 mL/min (ref >60.00)
Glucose, Bld: 81 mg/dL (ref 70–99)
Potassium: 3.4 mEq/L — ABNORMAL LOW (ref 3.5–5.1)
Sodium: 140 mEq/L (ref 135–145)

## 2015-07-04 MED ORDER — METRONIDAZOLE 500 MG PO TABS: 500.0000 mg | ORAL_TABLET | Freq: Three times a day (TID) | ORAL | Status: DC

## 2015-07-04 NOTE — Progress Notes (Signed)
OFFICE VISIT  07/04/2015   CC:  Chief Complaint  Patient presents with  . Follow-up    ER visit.   HPI:    Patient is a 80 y.o. Caucasian female who presents for f/u of a recent ED visit on 07/01/15 for a diagnosis of colitis. I reviewed the ED records, including labs and imaging. She presented with several days of watery, nonbloody diarrhea and one day of fever.  She had some lower abd pain. CT abd/pelv showed colitis.  She did have hx of antibiotics w/in the last month.   She was therefore treated empirically with flagyl for C diff colitis, plus stool testing for C diff was obtained. This returned showing C diff antigen positive but toxin negative.  Since she clinically had colitis, these results are likely confirmation of C diff being the causative agent of her infection.  She has been on abx since the evening of 07/02/15 and reports that her diarrhea has lessened some the last 48h.  No nocturnal BM last night. She has eaten jello, toast, broth, boullion cubes, banana.  She feels fatigued b/c not eating much. Only has occasional suprapubic region pain-mild.  No fevers.  No n/v.   Past Medical History  Diagnosis Date  . Hypertension     EKG 03/2010 normal  . COPD (chronic obstructive pulmonary disease) ([place])     spirometry 01/10/09 FEV 0.97(52%), FEV1% 47  . Hypoxemia     O2 86% RA with exertion 01/10/09, 2 liters oxygen with exertion and sleep  . Pulmonary nodule     CT chest 12/10, June 2011.  No nodule on CT 06/2010.  Marland Kitchen Spinal stenosis     Symptomatic (neurogenic claudication)  . GERD (gastroesophageal reflux disease)   . Hypothyroidism     Hashimoto's, dx'd 1989  . Diverticulosis of colon     Procto '97 and colonoscopy 2002  . DDD (degenerative disc disease), cervical     1998 MRI C5-6 impingemt (left)  . DDD (degenerative disc disease), lumbar   . Spinal stenosis of lumbar region with neurogenic claudication     2008 MRI (L3-4, L4-5, L5-S1)  . Fibrocystic breast disease      w/fibroadenoma.  Bx's showed NO atypia (Dr. Margot Chimes).  Mammo neg 2008.  Marland Kitchen Chest pain, non-cardiac     Cardiac CT showed no signif obstructive dz, mildly elevated calcium level (Dr. [name]).  . History of double vision     Ophthalmologist, Dr. Herbert Deaner, is further evaluating this with MRI  orbits and limited brain (myesthenia gravis testing neg 07/2010)  . Depression with anxiety 04/15/2011  . Asthma   . Dysplastic nevus of upper extremity 04/2014    R tricep ([name])  . Osteoarthritis, hip, bilateral     End stage; R THA  . History of home oxygen therapy     uses 2 liters at hs  . Family history of adverse reaction to anesthesia     mother died when patient age 26 receiving anesthesia for thyroid surgery  . Chronic hypoxemic respiratory failure ([place]) 02/02/2015    2L oxygen 24/7  . Shingles 02/2014    R abd/flank  . Right renal mass 2017    found on CT.  F/u MRI 03/31/15 showed it to be smaller and less likely of any significance but plan for repeat CT with renal protocol 6 mo per radiologist's recommendations.    Past Surgical History  Procedure Laterality Date  . Cholecystectomy  2001  . Tubal ligation  1974  .  Abdominal hysterectomy  1978    no BSO per pt--nonmalignant reasons  . Breast cyst excision      Last screening mammogram 10/2010 was normal.  . Appendectomy    . Tonsillectomy    . Colonoscopy  04/2005    NORMAL-recall 10 yrs  . Transthoracic echocardiogram  08/2014    EF 60-65%, grade I diast dysfxn  . Total hip arthroplasty Right 11/01/2014    Procedure: RIGHT TOTAL HIP ARTHROPLASTY;  Surgeon: Latanya Maudlin, MD;  Location: [facility];  Service: Orthopedics;  Laterality: Right;    Outpatient Prescriptions Prior to Visit  Medication Sig Dispense Refill  . albuterol (PROAIR HFA) 108 (90 BASE) MCG/ACT inhaler Inhale 2 puffs into the lungs every 4 (four) hours as needed. (Patient taking differently: Inhale 2 puffs into the lungs every 4 (four) hours as needed for wheezing  or shortness of breath. ) 1 Inhaler 5  . amLODipine (NORVASC) 5 MG tablet Take 1 tablet (5 mg total) by mouth daily. 90 tablet 3  . aspirin 325 MG tablet Take 325 mg by mouth daily.    . cetirizine (ZYRTEC) 10 MG tablet Take 1 tablet (10 mg total) by mouth daily. 30 tablet 11  . DALIRESP 500 MCG TABS tablet TAKE 1 TABLET BY MOUTH DAILY 90 tablet 0  . diazepam (VALIUM) 5 MG tablet 1 tab po qAM and 2 tabs po qhs 90 tablet 0  . Fluticasone-Salmeterol (ADVAIR DISKUS) 250-50 MCG/DOSE AEPB INHALE 1 PUFF INTO THE LUNGS TWICE DAILY 180 each 3  . omeprazole (PRILOSEC) 20 MG capsule Take 20 mg by mouth daily.      . RESTASIS 0.05 % ophthalmic emulsion Apply 1 drop to eye 2 (two) times daily.  4  . SPIRIVA HANDIHALER 18 MCG inhalation capsule INHALE 1 CAPSULE VIA HANDIHALER EVERY DAY AT THE SAME TIME 30 capsule 5  . SYNTHROID 88 MCG tablet TAKE 1 TABLET BY MOUTH DAILY 30 tablet 3  . venlafaxine (EFFEXOR) 50 MG tablet TAKE 1 TABLET(50 MG) BY MOUTH TWICE DAILY 60 tablet 1  . metroNIDAZOLE (FLAGYL) 500 MG tablet Take 1 tablet (500 mg total) by mouth 3 (three) times daily. One po bid x 7 days 21 tablet 0  . ciprofloxacin (CIPRO) 500 MG tablet Take 1 tablet (500 mg total) by mouth 2 (two) times daily. (Patient not taking: Reported on 07/04/2015) 10 tablet 0   No facility-administered medications prior to visit.    Allergies  Allergen Reactions  . Losartan Other (See Comments)    hyperkalemia  . Penicillins Hives, Swelling and Rash    Has patient had a PCN reaction causing immediate rash, facial/tongue/throat swelling, SOB or lightheadedness with hypotension: YES Has patient had a PCN reaction causing severe rash involving mucus membranes or skin necrosis: NO Has patient had a PCN reaction that required hospitalization NO Has patient had a PCN reaction occurring within the last 10 years: NO If all of the above answers are "NO", then may proceed with Cephalosporin use.   . Cefixime [Kdc:Cefixime] Other  (See Comments)    unspecified  . Indocin [Indomethacin] Other (See Comments)    Painful tongue and throat  . Sulfa Antibiotics Other (See Comments)    unspecified  . Ace Inhibitors Other (See Comments)    cough  . Statins Other (See Comments)    Myalgias     ROS As per HPI  PE: Blood pressure 117/76, pulse 83, temperature 97.6 F (36.4 C), temperature source Oral, resp. rate 16, height 5' (1.524  m), weight 140 lb 12 oz (63.844 kg), SpO2 93 %. Gen: Alert, well appearing.  Patient is oriented to person, place, time, and situation. [id number]: no injection, icteris, swelling, or exudate.  EOMI, PERRLA. Mouth: lips without lesion/swelling.  Oral mucosa pink and moist. Oropharynx without erythema, exudate, or swelling.  CV: RRR, no m/r/g.   LUNGS: CTA bilat, nonlabored resps, good aeration in all lung fields. ABD: soft, NT except for mild TTP in LLQ w/out guarding or rebound, ND, BS hypoactive.  No hepatospenomegaly or mass.  No bruits. EXT: no clubbing, cyanosis, or edema.    LABS:  Lab Results  Component Value Date   WBC 15.1* 07/01/2015   HGB 12.2 07/01/2015   HCT 37.4 07/01/2015   MCV 85.0 07/01/2015   PLT 294 07/01/2015     Chemistry      Component Value Date/Time   NA 138 07/01/2015 1730   K 3.2* 07/01/2015 1730   CL 106 07/01/2015 1730   CO2 23 07/01/2015 1730   BUN 13 07/01/2015 1730   CREATININE 0.80 07/01/2015 1730   CREATININE 0.68 06/02/2014 1518      Component Value Date/Time   CALCIUM 9.1 07/01/2015 1730   ALKPHOS 66 07/01/2015 1730   AST 16 07/01/2015 1730   ALT 9* 07/01/2015 1730   BILITOT 0.7 07/01/2015 1730     C diff antigen on 07/01/15: pos C diff toxin on 07/01/15: neg  IMPRESSION AND PLAN:  1) Colitis, suspect C diff: continue flagyl 500mg  tid for a full 14 day course (additional 7d rx sent in today). Start probiotic 1 qd. Push fluids, advance diet as tolerated. Recheck CBC today--she had leukocytosis on recent CBC.  2) Hypokalemia, mild.   Secondary to her diarrhea.  She got some replacement from the ED. Will recheck this today: BMET.  An After Visit Summary was printed and given to the patient.  FOLLOW UP: Return in about 1 week (around 07/11/2015) for f/u colitis. Pt requests to be put on the cancellation list at [facility] for her upcoming appt scheduled for 08/02/15 so I did this today.  Signed:  Crissie Sickles, MD           07/04/2015

## 2015-07-04 NOTE — Addendum Note (Signed)
Addended by: Onalee Hua on: 07/04/2015 03:12 PM   Modules accepted: Orders

## 2015-07-04 NOTE — Patient Instructions (Signed)
Take a probiotic once a day (make sure it says that it is a "gastrointestinal" probiotic) for the next 1 month.

## 2015-07-04 NOTE — Progress Notes (Signed)
Pre visit review using our clinic review tool, if applicable. No additional management support is needed unless otherwise documented below in the visit note. 

## 2015-07-10 DIAGNOSIS — R2681 Unsteadiness on feet: Secondary | ICD-10-CM | POA: Diagnosis not present

## 2015-07-10 DIAGNOSIS — Z96641 Presence of right artificial hip joint: Secondary | ICD-10-CM | POA: Diagnosis not present

## 2015-07-10 DIAGNOSIS — R293 Abnormal posture: Secondary | ICD-10-CM | POA: Diagnosis not present

## 2015-07-12 DIAGNOSIS — Z96641 Presence of right artificial hip joint: Secondary | ICD-10-CM | POA: Diagnosis not present

## 2015-07-12 DIAGNOSIS — R2681 Unsteadiness on feet: Secondary | ICD-10-CM | POA: Diagnosis not present

## 2015-07-12 DIAGNOSIS — R293 Abnormal posture: Secondary | ICD-10-CM | POA: Diagnosis not present

## 2015-07-17 DIAGNOSIS — R293 Abnormal posture: Secondary | ICD-10-CM | POA: Diagnosis not present

## 2015-07-17 DIAGNOSIS — Z96641 Presence of right artificial hip joint: Secondary | ICD-10-CM | POA: Diagnosis not present

## 2015-07-17 DIAGNOSIS — R2681 Unsteadiness on feet: Secondary | ICD-10-CM | POA: Diagnosis not present

## 2015-07-19 ENCOUNTER — Encounter: Payer: Self-pay | Admitting: Family Medicine

## 2015-07-19 ENCOUNTER — Ambulatory Visit (INDEPENDENT_AMBULATORY_CARE_PROVIDER_SITE_OTHER): Payer: Medicare Other | Admitting: Family Medicine

## 2015-07-19 VITALS — BP 146/76 | HR 86 | Temp 97.5°F | Resp 16 | Ht 60.0 in | Wt 141.5 lb

## 2015-07-19 DIAGNOSIS — A047 Enterocolitis due to Clostridium difficile: Secondary | ICD-10-CM

## 2015-07-19 DIAGNOSIS — A0472 Enterocolitis due to Clostridium difficile, not specified as recurrent: Secondary | ICD-10-CM

## 2015-07-19 NOTE — Progress Notes (Signed)
Pre visit review using our clinic review tool, if applicable. No additional management support is needed unless otherwise documented below in the visit note. 

## 2015-07-19 NOTE — Progress Notes (Signed)
OFFICE VISIT  07/19/2015   CC:  Chief Complaint  Patient presents with  . Follow-up    Colitis     HPI:    Patient is a 80 y.o. Caucasian female who presents for 2 week f/u colitis. Treated with flagyl for possible C diff.  She is also taking a probiotic the last two weeks.  No abd pains/cramping. I feel better.  BM lessening lessening in frequency and starting to show a bit of form/consistency.  No nocturnal BMs.  Some days has only 1-2 BMs, others up to 5.  No fevers. Rare nausea---from flagyl.  No blood in stool.   Some lower abd soreness on each side.    Past Medical History  Diagnosis Date  . Hypertension     EKG 03/2010 normal  . COPD (chronic obstructive pulmonary disease) ([place])     spirometry 01/10/09 FEV 0.97(52%), FEV1% 47  . Hypoxemia     O2 86% RA with exertion 01/10/09, 2 liters oxygen with exertion and sleep  . Pulmonary nodule     CT chest 12/10, June 2011.  No nodule on CT 06/2010.  Marland Kitchen Spinal stenosis     Symptomatic (neurogenic claudication)  . GERD (gastroesophageal reflux disease)   . Hypothyroidism     Hashimoto's, dx'd 1989  . Diverticulosis of colon     Procto '97 and colonoscopy 2002  . DDD (degenerative disc disease), cervical     1998 MRI C5-6 impingemt (left)  . DDD (degenerative disc disease), lumbar   . Spinal stenosis of lumbar region with neurogenic claudication     2008 MRI (L3-4, L4-5, L5-S1)  . Fibrocystic breast disease     w/fibroadenoma.  Bx's showed NO atypia (Dr. Margot Chimes).  Mammo neg 2008.  Marland Kitchen Chest pain, non-cardiac     Cardiac CT showed no signif obstructive dz, mildly elevated calcium level (Dr. [name]).  . History of double vision     Ophthalmologist, Dr. Herbert Deaner, is further evaluating this with MRI  orbits and limited brain (myesthenia gravis testing neg 07/2010)  . Depression with anxiety 04/15/2011  . Asthma   . Dysplastic nevus of upper extremity 04/2014    R tricep ([name])  . Osteoarthritis, hip, bilateral     End stage;  R THA  . History of home oxygen therapy     uses 2 liters at hs  . Family history of adverse reaction to anesthesia     mother died when patient age 35 receiving anesthesia for thyroid surgery  . Chronic hypoxemic respiratory failure ([place]) 02/02/2015    2L oxygen 24/7  . Shingles 02/2014    R abd/flank  . Right renal mass 2017    found on CT.  F/u MRI 03/31/15 showed it to be smaller and less likely of any significance but plan for repeat CT with renal protocol 6 mo per radiologist's recommendations.    Past Surgical History  Procedure Laterality Date  . Cholecystectomy  2001  . Tubal ligation  1974  . Abdominal hysterectomy  1978    no BSO per pt--nonmalignant reasons  . Breast cyst excision      Last screening mammogram 10/2010 was normal.  . Appendectomy    . Tonsillectomy    . Colonoscopy  04/2005    NORMAL-recall 10 yrs  . Transthoracic echocardiogram  08/2014    EF 60-65%, grade I diast dysfxn  . Total hip arthroplasty Right 11/01/2014    Procedure: RIGHT TOTAL HIP ARTHROPLASTY;  Surgeon: Latanya Maudlin, MD;  Location: [facility];  Service: Orthopedics;  Laterality: Right;    Outpatient Prescriptions Prior to Visit  Medication Sig Dispense Refill  . albuterol (PROAIR HFA) 108 (90 BASE) MCG/ACT inhaler Inhale 2 puffs into the lungs every 4 (four) hours as needed. (Patient taking differently: Inhale 2 puffs into the lungs every 4 (four) hours as needed for wheezing or shortness of breath. ) 1 Inhaler 5  . amLODipine (NORVASC) 5 MG tablet Take 1 tablet (5 mg total) by mouth daily. 90 tablet 3  . aspirin 325 MG tablet Take 325 mg by mouth daily.    . cetirizine (ZYRTEC) 10 MG tablet Take 1 tablet (10 mg total) by mouth daily. 30 tablet 11  . DALIRESP 500 MCG TABS tablet TAKE 1 TABLET BY MOUTH DAILY 90 tablet 0  . diazepam (VALIUM) 5 MG tablet 1 tab po qAM and 2 tabs po qhs 90 tablet 0  . Fluticasone-Salmeterol (ADVAIR DISKUS) 250-50 MCG/DOSE AEPB INHALE 1 PUFF INTO THE LUNGS TWICE DAILY  180 each 3  . metroNIDAZOLE (FLAGYL) 500 MG tablet Take 1 tablet (500 mg total) by mouth 3 (three) times daily. 21 tablet 0  . omeprazole (PRILOSEC) 20 MG capsule Take 20 mg by mouth daily.      . RESTASIS 0.05 % ophthalmic emulsion Apply 1 drop to eye 2 (two) times daily.  4  . SPIRIVA HANDIHALER 18 MCG inhalation capsule INHALE 1 CAPSULE VIA HANDIHALER EVERY DAY AT THE SAME TIME 30 capsule 5  . SYNTHROID 88 MCG tablet TAKE 1 TABLET BY MOUTH DAILY 30 tablet 3  . venlafaxine (EFFEXOR) 50 MG tablet TAKE 1 TABLET(50 MG) BY MOUTH TWICE DAILY 60 tablet 1   No facility-administered medications prior to visit.    Allergies  Allergen Reactions  . Losartan Other (See Comments)    hyperkalemia  . Penicillins Hives, Swelling and Rash    Has patient had a PCN reaction causing immediate rash, facial/tongue/throat swelling, SOB or lightheadedness with hypotension: YES Has patient had a PCN reaction causing severe rash involving mucus membranes or skin necrosis: NO Has patient had a PCN reaction that required hospitalization NO Has patient had a PCN reaction occurring within the last 10 years: NO If all of the above answers are "NO", then may proceed with Cephalosporin use.   . Cefixime [Kdc:Cefixime] Other (See Comments)    unspecified  . Indocin [Indomethacin] Other (See Comments)    Painful tongue and throat  . Sulfa Antibiotics Other (See Comments)    unspecified  . Ace Inhibitors Other (See Comments)    cough  . Statins Other (See Comments)    Myalgias     ROS As per HPI  PE: Blood pressure 146/76, pulse 86, temperature 97.5 F (36.4 C), temperature source Oral, resp. rate 16, height 5' (1.524 m), weight 141 lb 8 oz (64.184 kg), SpO2 93 %. Gen: Alert, well appearing.  Patient is oriented to person, place, time, and situation. ABD: soft, NT/ND, BS normal.  EXT: no clubbing, cyanosis, or edema.    LABS:  Lab Results  Component Value Date   WBC 10.0 07/04/2015   HGB 12.1  07/04/2015   HCT 37.2 07/04/2015   MCV 82.9 07/04/2015   PLT 356.0 07/04/2015     Chemistry      Component Value Date/Time   NA 140 07/04/2015 1435   K 3.4* 07/04/2015 1435   CL 105 07/04/2015 1435   CO2 25 07/04/2015 1435   BUN 10 07/04/2015 1435   CREATININE  0.77 07/04/2015 1435   CREATININE 0.68 06/02/2014 1518      Component Value Date/Time   CALCIUM 9.5 07/04/2015 1435   ALKPHOS 66 07/01/2015 1730   AST 16 07/01/2015 1730   ALT 9* 07/01/2015 1730   BILITOT 0.7 07/01/2015 1730       IMPRESSION AND PLAN:  Colitis, suspected C diff. Resolving.  Finish flagyl.  Continue probiotic qd for full month. Keep GI appt set for the end of this month.  An After Visit Summary was printed and given to the patient.  FOLLOW UP: Return in about 2 months (around 09/18/2015) for AWV.  Signed:  Crissie Sickles, MD           07/19/2015

## 2015-07-24 DIAGNOSIS — R293 Abnormal posture: Secondary | ICD-10-CM | POA: Diagnosis not present

## 2015-07-24 DIAGNOSIS — Z96641 Presence of right artificial hip joint: Secondary | ICD-10-CM | POA: Diagnosis not present

## 2015-07-24 DIAGNOSIS — R2681 Unsteadiness on feet: Secondary | ICD-10-CM | POA: Diagnosis not present

## 2015-07-26 DIAGNOSIS — H16229 Keratoconjunctivitis sicca, not specified as Sjogren's, unspecified eye: Secondary | ICD-10-CM | POA: Diagnosis not present

## 2015-07-26 DIAGNOSIS — H518 Other specified disorders of binocular movement: Secondary | ICD-10-CM | POA: Diagnosis not present

## 2015-07-27 ENCOUNTER — Other Ambulatory Visit: Payer: Self-pay | Admitting: Family Medicine

## 2015-07-27 NOTE — Telephone Encounter (Signed)
Rx faxed

## 2015-07-27 NOTE — Telephone Encounter (Signed)
RF request for diazepam LOV: 06/18/15 Next ov: 09/18/15 Last written: 04/18/15 #90 w/ 0RF  Please advise. Thanks.

## 2015-08-02 ENCOUNTER — Telehealth: Payer: Self-pay | Admitting: Family Medicine

## 2015-08-02 ENCOUNTER — Telehealth: Payer: Self-pay | Admitting: Gastroenterology

## 2015-08-02 ENCOUNTER — Ambulatory Visit: Payer: Medicare Other | Admitting: Gastroenterology

## 2015-08-02 ENCOUNTER — Other Ambulatory Visit: Payer: Self-pay | Admitting: Family Medicine

## 2015-08-02 MED ORDER — CHOLESTYRAMINE 4 G PO PACK: 4.0000 g | PACK | Freq: Three times a day (TID) | ORAL | Status: DC

## 2015-08-02 MED ORDER — METRONIDAZOLE 500 MG PO TABS: 500.0000 mg | ORAL_TABLET | Freq: Three times a day (TID) | ORAL | Status: DC

## 2015-08-02 NOTE — Telephone Encounter (Signed)
No charge. 

## 2015-08-02 NOTE — Telephone Encounter (Signed)
Priscilla Cantrell, can you see if Dr. Earlean Shawl can see her any quicker--she was already an established pt with him I believe.  [name], tell her we're checking into her scheduling question, and I am sending in a medication to try to help with her diarrhea.  Tell her she can stop any current anti-diarrhea med she is taking when she starts this one.-thx

## 2015-08-02 NOTE — Telephone Encounter (Signed)
Patient got mixed up and thought her eye doctor appt was the day of her GI appt so she missed her GI appt. They have rescheduled her for August. Patient has already had 5 bm's today. Is there any way she can be seen earlier? She is okay with going back to Dr. Earlean Shawl if she can get an earlier appt.

## 2015-08-02 NOTE — Telephone Encounter (Signed)
Please advise. Thanks.  

## 2015-08-02 NOTE — Telephone Encounter (Signed)
Left message on home and cell vm for pt to call back.

## 2015-08-02 NOTE — Telephone Encounter (Signed)
Also, tell her I want her to get back on Flagyl, so I'll send this to her pharmacy as well.-thx

## 2015-08-03 NOTE — Telephone Encounter (Signed)
Pt advised and voiced understanding.  She is waiting on call from [facility].

## 2015-08-03 NOTE — Telephone Encounter (Signed)
Advised patient that I have faxed referral to [facility]. When I called [facility] office they advised their first available appointment in mid August. Patient is also going to call [facility] to check to see if they get any cancellations next week.Patient did mention that hasn't gotten Rx yet because the pharmacy was having trouble with her insurance coverage.

## 2015-08-04 NOTE — Telephone Encounter (Signed)
Noted  

## 2015-08-10 DIAGNOSIS — H5032 Intermittent alternating esotropia: Secondary | ICD-10-CM | POA: Diagnosis not present

## 2015-08-10 DIAGNOSIS — Z961 Presence of intraocular lens: Secondary | ICD-10-CM | POA: Diagnosis not present

## 2015-08-16 DIAGNOSIS — R2681 Unsteadiness on feet: Secondary | ICD-10-CM | POA: Diagnosis not present

## 2015-08-16 DIAGNOSIS — Z96641 Presence of right artificial hip joint: Secondary | ICD-10-CM | POA: Diagnosis not present

## 2015-08-16 DIAGNOSIS — R293 Abnormal posture: Secondary | ICD-10-CM | POA: Diagnosis not present

## 2015-08-17 NOTE — Telephone Encounter (Signed)
FYI

## 2015-08-17 NOTE — Telephone Encounter (Signed)
Patient called back to let you know she was to get an appointment with Dr. Earlean Shawl 08/30/15. I inquired as to how she was doing & she stated that she was having "good & bad" days, she happy that she was able to get an earlier GI appt. She did not request a CB.

## 2015-08-17 NOTE — Telephone Encounter (Signed)
Noted  

## 2015-08-20 ENCOUNTER — Encounter: Payer: Self-pay | Admitting: Family Medicine

## 2015-08-20 ENCOUNTER — Ambulatory Visit (INDEPENDENT_AMBULATORY_CARE_PROVIDER_SITE_OTHER): Payer: Medicare Other | Admitting: Family Medicine

## 2015-08-20 VITALS — BP 168/76 | HR 71 | Temp 97.4°F | Resp 16 | Ht 60.0 in | Wt 140.5 lb

## 2015-08-20 DIAGNOSIS — K529 Noninfective gastroenteritis and colitis, unspecified: Secondary | ICD-10-CM

## 2015-08-20 DIAGNOSIS — H8110 Benign paroxysmal vertigo, unspecified ear: Secondary | ICD-10-CM

## 2015-08-20 NOTE — Progress Notes (Signed)
Pre visit review using our clinic review tool, if applicable. No additional management support is needed unless otherwise documented below in the visit note. 

## 2015-08-20 NOTE — Progress Notes (Signed)
OFFICE VISIT  08/20/2015   CC:  Chief Complaint  Patient presents with  . Dizziness    x 1 day     HPI:    Patient is a 80 y.o. Caucasian female who presents for "dizziness". Rolled over in bed yesterday and "the world was spinning", lasted a minute or so and throughout the day she felt mild sensation of being off balance.  No meds taken for the symptoms.   No fever.  No signif nausea.  No vomiting.  No HA.  She has not fallen. This morning the scenario repeated itself. Has never had positional vertigo before. No vision or hearing complaints. No fevers, no vomiting.  No recent head trauma.  No focal weakness, slurred speech, swallowing problems, or paresthesias.  No HA.  ROS: ongoing loose BMs since about the middle of May this year.  Past Medical History  Diagnosis Date  . Hypertension     EKG 03/2010 normal  . COPD (chronic obstructive pulmonary disease) ([place])     spirometry 01/10/09 FEV 0.97(52%), FEV1% 47  . Hypoxemia     O2 86% RA with exertion 01/10/09, 2 liters oxygen with exertion and sleep  . Pulmonary nodule     CT chest 12/10, June 2011.  No nodule on CT 06/2010.  Marland Kitchen Spinal stenosis     Symptomatic (neurogenic claudication)  . GERD (gastroesophageal reflux disease)   . Hypothyroidism     Hashimoto's, dx'd 1989  . Diverticulosis of colon     Procto '97 and colonoscopy 2002  . DDD (degenerative disc disease), cervical     1998 MRI C5-6 impingemt (left)  . DDD (degenerative disc disease), lumbar   . Spinal stenosis of lumbar region with neurogenic claudication     2008 MRI (L3-4, L4-5, L5-S1)  . Fibrocystic breast disease     w/fibroadenoma.  Bx's showed NO atypia (Dr. Margot Chimes).  Mammo neg 2008.  Marland Kitchen Chest pain, non-cardiac     Cardiac CT showed no signif obstructive dz, mildly elevated calcium level (Dr. [name]).  . History of double vision     Ophthalmologist, Dr. Herbert Deaner, is further evaluating this with MRI  orbits and limited brain (myesthenia gravis testing  neg 07/2010)  . Depression with anxiety 04/15/2011  . Asthma   . Dysplastic nevus of upper extremity 04/2014    R tricep ([name])  . Osteoarthritis, hip, bilateral     End stage; R THA  . History of home oxygen therapy     uses 2 liters at hs  . Family history of adverse reaction to anesthesia     mother died when patient age 76 receiving anesthesia for thyroid surgery  . Chronic hypoxemic respiratory failure ([place]) 02/02/2015    2L oxygen 24/7  . Shingles 02/2014    R abd/flank  . Right renal mass 2017    found on CT.  F/u MRI 03/31/15 showed it to be smaller and less likely of any significance but plan for repeat CT with renal protocol 6 mo per radiologist's recommendations.    Past Surgical History  Procedure Laterality Date  . Cholecystectomy  2001  . Tubal ligation  1974  . Abdominal hysterectomy  1978    no BSO per pt--nonmalignant reasons  . Breast cyst excision      Last screening mammogram 10/2010 was normal.  . Appendectomy    . Tonsillectomy    . Colonoscopy  04/2005    NORMAL-recall 10 yrs  . Transthoracic echocardiogram  08/2014  EF 60-65%, grade I diast dysfxn  . Total hip arthroplasty Right 11/01/2014    Procedure: RIGHT TOTAL HIP ARTHROPLASTY;  Surgeon: Latanya Maudlin, MD;  Location: [facility];  Service: Orthopedics;  Laterality: Right;    Outpatient Prescriptions Prior to Visit  Medication Sig Dispense Refill  . albuterol (PROAIR HFA) 108 (90 BASE) MCG/ACT inhaler Inhale 2 puffs into the lungs every 4 (four) hours as needed. (Patient taking differently: Inhale 2 puffs into the lungs every 4 (four) hours as needed for wheezing or shortness of breath. ) 1 Inhaler 5  . amLODipine (NORVASC) 5 MG tablet Take 1 tablet (5 mg total) by mouth daily. 90 tablet 3  . aspirin 325 MG tablet Take 325 mg by mouth daily.    . cetirizine (ZYRTEC) 10 MG tablet Take 1 tablet (10 mg total) by mouth daily. 30 tablet 11  . cholestyramine (QUESTRAN) 4 g packet TAKE 1 PACKET THREE TIMES  DAILY WITH MEALS 270 packet 1  . DALIRESP 500 MCG TABS tablet TAKE 1 TABLET BY MOUTH DAILY 90 tablet 0  . diazepam (VALIUM) 5 MG tablet TAKE 1 TABLET BY MOUTH EVERY MORNING AND 2 TABLETS BY MOUTH EVERY NIGHT AT BEDTIME 90 tablet 5  . Fluticasone-Salmeterol (ADVAIR DISKUS) 250-50 MCG/DOSE AEPB INHALE 1 PUFF INTO THE LUNGS TWICE DAILY 180 each 3  . metroNIDAZOLE (FLAGYL) 500 MG tablet Take 1 tablet (500 mg total) by mouth 3 (three) times daily. 42 tablet 0  . omeprazole (PRILOSEC) 20 MG capsule Take 20 mg by mouth daily.      . Probiotic Product (PROBIOTIC DAILY PO) Take 1 capsule by mouth daily.    . RESTASIS 0.05 % ophthalmic emulsion Apply 1 drop to eye 2 (two) times daily.  4  . SPIRIVA HANDIHALER 18 MCG inhalation capsule INHALE 1 CAPSULE VIA HANDIHALER EVERY DAY AT THE SAME TIME 30 capsule 5  . SYNTHROID 88 MCG tablet TAKE 1 TABLET BY MOUTH DAILY 30 tablet 3  . venlafaxine (EFFEXOR) 50 MG tablet TAKE 1 TABLET(50 MG) BY MOUTH TWICE DAILY 60 tablet 1   No facility-administered medications prior to visit.    Allergies  Allergen Reactions  . Losartan Other (See Comments)    hyperkalemia  . Penicillins Hives, Swelling and Rash    Has patient had a PCN reaction causing immediate rash, facial/tongue/throat swelling, SOB or lightheadedness with hypotension: YES Has patient had a PCN reaction causing severe rash involving mucus membranes or skin necrosis: NO Has patient had a PCN reaction that required hospitalization NO Has patient had a PCN reaction occurring within the last 10 years: NO If all of the above answers are "NO", then may proceed with Cephalosporin use.   . Cefixime [Kdc:Cefixime] Other (See Comments)    unspecified  . Indocin [Indomethacin] Other (See Comments)    Painful tongue and throat  . Sulfa Antibiotics Other (See Comments)    unspecified  . Ace Inhibitors Other (See Comments)    cough  . Statins Other (See Comments)    Myalgias     ROS As per  HPI  PE: Blood pressure 168/76, pulse 71, temperature 97.4 F (36.3 C), temperature source Oral, resp. rate 16, height 5' (1.524 m), weight 140 lb 8 oz (63.73 kg), SpO2 95 %. Gen: Alert, well appearing.  Patient is oriented to person, place, time, and situation. AFFECT: pleasant, lucid thought and speech. [id number]: no injection, icteris, swelling, or exudate.  EOMI, PERRLA.  No nystagmus. Mouth: lips without lesion/swelling.  Oral mucosa pink  and moist. Oropharynx without erythema, exudate, or swelling.   CV: RRR, no m/r/g.   LUNGS: CTA bilat, nonlabored resps, good aeration in all lung fields. EXT: no clubbing, cyanosis, or edema.  Neuro: CN 2-12 intact bilaterally, strength 5/5 in proximal and distal upper extremities and lower extremities bilaterally.   Mild resting tremor.  No prob w/FNF with either hand..  No ataxia.   Dix-halpike maneuvers in office today were negative for vertigo or nystagmus.  LABS:  none  IMPRESSION AND PLAN:  1) BPPV. Discussed dx with pt today.  Reviewed home Epley's maneuvers and gave patient a handout demonstrating these. If no further vertigo then she doesn't need to do these, but if vertigo recurs then she is to start doing these.   2) Diarrhea x 2 mo: presumed C diff, with ? Recurrence. Stools have become a bit more formed with addition of cholestyramine lately.  She is finishing up the last of her flagyl now. Has GI appt with Dr. Earlean Shawl 08/30/15.  An After Visit Summary was printed and given to the patient.  FOLLOW UP: Return if symptoms worsen or fail to improve.  Signed:  Crissie Sickles, MD           08/20/2015

## 2015-08-21 ENCOUNTER — Other Ambulatory Visit: Payer: Self-pay | Admitting: Family Medicine

## 2015-08-21 NOTE — Telephone Encounter (Signed)
RF request for venlafaxine LOV: 06/18/15 Next ov: 09/18/15 Last written: 03/02/15 #60 w/ 1RF

## 2015-08-22 DIAGNOSIS — Z96641 Presence of right artificial hip joint: Secondary | ICD-10-CM | POA: Diagnosis not present

## 2015-08-22 DIAGNOSIS — R2681 Unsteadiness on feet: Secondary | ICD-10-CM | POA: Diagnosis not present

## 2015-08-22 DIAGNOSIS — R293 Abnormal posture: Secondary | ICD-10-CM | POA: Diagnosis not present

## 2015-08-28 DIAGNOSIS — R293 Abnormal posture: Secondary | ICD-10-CM | POA: Diagnosis not present

## 2015-08-28 DIAGNOSIS — R2681 Unsteadiness on feet: Secondary | ICD-10-CM | POA: Diagnosis not present

## 2015-08-28 DIAGNOSIS — Z96641 Presence of right artificial hip joint: Secondary | ICD-10-CM | POA: Diagnosis not present

## 2015-08-30 ENCOUNTER — Encounter: Payer: Self-pay | Admitting: Family Medicine

## 2015-08-30 DIAGNOSIS — R197 Diarrhea, unspecified: Secondary | ICD-10-CM | POA: Diagnosis not present

## 2015-09-06 DIAGNOSIS — L219 Seborrheic dermatitis, unspecified: Secondary | ICD-10-CM | POA: Diagnosis not present

## 2015-09-06 DIAGNOSIS — D485 Neoplasm of uncertain behavior of skin: Secondary | ICD-10-CM | POA: Diagnosis not present

## 2015-09-06 DIAGNOSIS — D239 Other benign neoplasm of skin, unspecified: Secondary | ICD-10-CM | POA: Diagnosis not present

## 2015-09-06 DIAGNOSIS — L820 Inflamed seborrheic keratosis: Secondary | ICD-10-CM | POA: Diagnosis not present

## 2015-09-06 DIAGNOSIS — L821 Other seborrheic keratosis: Secondary | ICD-10-CM | POA: Diagnosis not present

## 2015-09-10 DIAGNOSIS — H5032 Intermittent alternating esotropia: Secondary | ICD-10-CM | POA: Diagnosis not present

## 2015-09-12 DIAGNOSIS — R197 Diarrhea, unspecified: Secondary | ICD-10-CM | POA: Diagnosis not present

## 2015-09-14 ENCOUNTER — Other Ambulatory Visit: Payer: Self-pay | Admitting: Pulmonary Disease

## 2015-09-17 DIAGNOSIS — R197 Diarrhea, unspecified: Secondary | ICD-10-CM | POA: Diagnosis not present

## 2015-09-18 ENCOUNTER — Encounter: Payer: Self-pay | Admitting: Family Medicine

## 2015-09-18 ENCOUNTER — Other Ambulatory Visit: Payer: Self-pay | Admitting: Family Medicine

## 2015-09-18 ENCOUNTER — Ambulatory Visit (INDEPENDENT_AMBULATORY_CARE_PROVIDER_SITE_OTHER): Payer: Medicare Other | Admitting: Family Medicine

## 2015-09-18 VITALS — BP 141/89 | HR 83 | Temp 97.9°F | Resp 16 | Ht 60.5 in | Wt 138.0 lb

## 2015-09-18 DIAGNOSIS — Z0000 Encounter for general adult medical examination without abnormal findings: Secondary | ICD-10-CM | POA: Diagnosis not present

## 2015-09-18 DIAGNOSIS — E2839 Other primary ovarian failure: Secondary | ICD-10-CM | POA: Diagnosis not present

## 2015-09-18 DIAGNOSIS — Z131 Encounter for screening for diabetes mellitus: Secondary | ICD-10-CM

## 2015-09-18 MED ORDER — METRONIDAZOLE 500 MG PO TABS: 500.0000 mg | ORAL_TABLET | Freq: Three times a day (TID) | ORAL | 0 refills | Status: DC

## 2015-09-18 NOTE — Progress Notes (Signed)
The patient is here for annual Medicare wellness examination and management of other chronic and acute problems. Other problems discussed today: none  Lab Results  Component Value Date   TSH 2.92 03/20/2015   Lab Results  Component Value Date   WBC 10.0 07/04/2015   HGB 12.1 07/04/2015   HCT 37.2 07/04/2015   MCV 82.9 07/04/2015   PLT 356.0 07/04/2015   Lab Results  Component Value Date   CREATININE 0.77 07/04/2015   BUN 10 07/04/2015   NA 140 07/04/2015   K 3.4 (L) 07/04/2015   CL 105 07/04/2015   CO2 25 07/04/2015   Lab Results  Component Value Date   ALT 9 (L) 07/01/2015   AST 16 07/01/2015   ALKPHOS 66 07/01/2015   BILITOT 0.7 07/01/2015   Lab Results  Component Value Date   CHOL 193 06/18/2015   Lab Results  Component Value Date   HDL 61.30 06/18/2015   Lab Results  Component Value Date   LDLCALC 106 (H) 06/18/2015   Lab Results  Component Value Date   TRIG 124.0 06/18/2015   Lab Results  Component Value Date   CHOLHDL 3 06/18/2015   Lab Results  Component Value Date   HGBA1C 5.7 10/29/2011    AWV DATA The risk factors are reflected in the social history.  The roster of all physicians providing medical care to patient is listed in the Snapshot section of the chart.  Activities of daily living:  The patient is 100% independent in all ADLs: dressing, toileting, feeding as well as independent mobility.  Home safety : The patient has smoke detectors in the home. They wear seatbelts. No firearms at home ( firearms are present in the home, kept in a safe fashion). There is no violence in the home.   There is no risks for hepatitis, STDs or HIV. There is no history of blood transfusion. They have no travel history to infectious disease endemic areas of the world.  The patient has not seen their dentist in the last six months: she has mostly false teeth.. They have seen their eye doctor in the last year. They deny (admit to) any hearing difficulty and  have not had audiologic testing in the last year.  They do not  have excessive sun exposure. Discussed the need for sun protection: hats, long sleeves and use of sunscreen if there is significant sun exposure.   Diet: the importance of a healthy diet is discussed. They do have a moderately healthy diet.  The patient does not have a regular exercise program.  Pt waiting on strength to return.  The benefits of regular aerobic exercise were discussed.  Depression screen: there are no signs or vegative symptoms of depression- irritability, change in appetite, anhedonia, sadness/tearfullness.  Cognitive assessment: the patient manages all their financial and personal affairs and is actively engaged. They could relate day,date,year and events; recalled 3/3 objects at 3 minutes; performed clock-face test normally.  Reviewed advance directives with pt today.  She is currently in the process of setting this up.  The following portions of the patient's history were reviewed and updated as appropriate: allergies, current medications, past family history, past medical history,  past surgical history, past social history  and problem list.  Vision, hearing, body mass index were assessed and reviewed. Body mass index is 26.51 kg/m.   During the course of the visit the patient was educated and counseled about appropriate screening and preventive services including :  Annual wellness  visit : doing currently. diabetes screening: will have pt get fasting glucose at next opportunity. colorectal cancer screening: pt is to get colonoscopy with Dr. Earlean Shawl this year. recommended immunizations (influenza, pneumococcal, Hep B): Pt UTD. Bone mass measurement: will arrange DEXA  Counseling to prevent tobacco use: n/a Depression screening: NEG today. Glaucoma screening: via her eye MD Hepatitis C virus screening: pt does not qualify HIV virus screening: Pt declines. Lung cancer screening: pt doesn't  qualify. Medical nutrition therapy: n/a Prostate cancer screening: n/a Screening mammography: pt is UTD, needs next mammogram 05/2016. Screening pap tests, pelvic exam, and clinical breast exam: pt doesn't qualify due to having had a hysterectomy for benign reasons. Ultrasound screening for AAA: pt doesn't qualify.  A written plan of action regarding the above screening and preventative services was given to the patient today.  I ordered a fasting glucose--future, as well as a DEXA ([facility]) today.  An After Visit Summary was printed and given to the patient.  Signed:  Crissie Sickles, MD           09/18/2015

## 2015-09-18 NOTE — Progress Notes (Signed)
Pre visit review using our clinic review tool, if applicable. No additional management support is needed unless otherwise documented below in the visit note. 

## 2015-09-18 NOTE — Addendum Note (Signed)
Addended by: Onalee Hua on: 09/18/2015 12:19 PM   Modules accepted: Orders

## 2015-09-24 ENCOUNTER — Other Ambulatory Visit: Payer: Self-pay | Admitting: Pulmonary Disease

## 2015-09-26 ENCOUNTER — Other Ambulatory Visit (INDEPENDENT_AMBULATORY_CARE_PROVIDER_SITE_OTHER): Payer: Medicare Other

## 2015-09-26 DIAGNOSIS — Z131 Encounter for screening for diabetes mellitus: Secondary | ICD-10-CM

## 2015-09-26 LAB — GLUCOSE, RANDOM: Glucose, Bld: 82 mg/dL (ref 70–99)

## 2015-10-05 ENCOUNTER — Telehealth: Payer: Self-pay | Admitting: Family Medicine

## 2015-10-05 ENCOUNTER — Encounter: Payer: Self-pay | Admitting: Family Medicine

## 2015-10-05 ENCOUNTER — Ambulatory Visit: Payer: Medicare Other | Admitting: Gastroenterology

## 2015-10-05 NOTE — Telephone Encounter (Signed)
Patient called in to advise that Dr. Earlean Shawl dx her with cdiff. She is taking flagil & cholestyramine. No CB required.

## 2015-10-05 NOTE — Telephone Encounter (Signed)
Noted  

## 2015-10-12 DIAGNOSIS — H5032 Intermittent alternating esotropia: Secondary | ICD-10-CM | POA: Diagnosis not present

## 2015-10-12 DIAGNOSIS — Z961 Presence of intraocular lens: Secondary | ICD-10-CM | POA: Diagnosis not present

## 2015-10-14 ENCOUNTER — Other Ambulatory Visit: Payer: Self-pay | Admitting: Pulmonary Disease

## 2015-10-24 ENCOUNTER — Encounter: Payer: Self-pay | Admitting: Family Medicine

## 2015-10-24 ENCOUNTER — Ambulatory Visit (INDEPENDENT_AMBULATORY_CARE_PROVIDER_SITE_OTHER): Payer: Medicare Other | Admitting: Family Medicine

## 2015-10-24 VITALS — BP 154/91 | HR 71 | Temp 97.5°F | Resp 18 | Wt 137.8 lb

## 2015-10-24 DIAGNOSIS — J438 Other emphysema: Secondary | ICD-10-CM | POA: Diagnosis not present

## 2015-10-24 DIAGNOSIS — N2889 Other specified disorders of kidney and ureter: Secondary | ICD-10-CM | POA: Diagnosis not present

## 2015-10-24 DIAGNOSIS — I10 Essential (primary) hypertension: Secondary | ICD-10-CM

## 2015-10-24 DIAGNOSIS — J9611 Chronic respiratory failure with hypoxia: Secondary | ICD-10-CM | POA: Diagnosis not present

## 2015-10-24 DIAGNOSIS — E039 Hypothyroidism, unspecified: Secondary | ICD-10-CM

## 2015-10-24 LAB — BASIC METABOLIC PANEL
BUN: 13 mg/dL (ref 6–23)
CO2: 29 mEq/L (ref 19–32)
Calcium: 9.1 mg/dL (ref 8.4–10.5)
Chloride: 107 mEq/L (ref 96–112)
Creatinine, Ser: 0.78 mg/dL (ref 0.40–1.20)
GFR: 75.83 mL/min (ref >60.00)
Glucose, Bld: 76 mg/dL (ref 70–99)
Potassium: 4.4 mEq/L (ref 3.5–5.1)
Sodium: 143 mEq/L (ref 135–145)

## 2015-10-24 NOTE — Progress Notes (Signed)
Pre visit review using our clinic review tool, if applicable. No additional management support is needed unless otherwise documented below in the visit note. 

## 2015-10-24 NOTE — Progress Notes (Signed)
OFFICE VISIT  10/26/2015   CC:  Chief Complaint  Patient presents with  . Follow-up    HPI:    Patient is a 80 y.o. Caucasian female who presents for COPD with chronic hypoxic resp failure, HTN, and hypothyroidism. Has had recurrent/persistent C diff colitis last few months and is currently on vancomycin and followed by GI. Currently having a BM once qAM and it is soft/formed.  She no longer takes cholestyramine.   No dairy, no caffeine.  Eating healthier overall.  Breathing feels fine.  Only wears oxygen hs or with exertion in daytime or if oxygen 88% or less.  Energy level is improved.  No home bp monitoring lately.    Takes thyroid med with other AM meds w/out regard to food/eating.  She has always taken it this way.  Past Medical History:  Diagnosis Date  . Asthma   . Chest pain, non-cardiac    Cardiac CT showed no signif obstructive dz, mildly elevated calcium level (Dr. [name]).  . Chronic hypoxemic respiratory failure ([place]) 02/02/2015   2L oxygen 24/7  . COPD (chronic obstructive pulmonary disease) ([place])    spirometry 01/10/09 FEV 0.97(52%), FEV1% 47  . DDD (degenerative disc disease), cervical    1998 MRI C5-6 impingemt (left)  . DDD (degenerative disc disease), lumbar   . Depression with anxiety 04/15/2011  . Diarrheal disease Summer 2017   ? 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).  As of 10/05/15 pt reports GI dx'd her with C diff and rx'd more flagyl and she is also on cholestyramine.  . Diverticulosis of colon    Procto '97 and colonoscopy 2002  . Dysplastic nevus of upper extremity 04/2014   R tricep ([name])  . Family history of adverse reaction to anesthesia    mother died when patient age 30 receiving anesthesia for thyroid surgery  . Fibrocystic breast disease    w/fibroadenoma.  Bx's showed NO atypia (Dr. Margot Chimes).  Mammo neg 2008.  Marland Kitchen GERD  (gastroesophageal reflux disease)   . History of double vision    Ophthalmologist, Dr. Herbert Deaner, is further evaluating this with MRI  orbits and limited brain (myesthenia gravis testing neg 07/2010)  . History of home oxygen therapy    uses 2 liters at hs  . Hypertension    EKG 03/2010 normal  . Hypothyroidism    Hashimoto's, dx'd 1989  . Hypoxemia    O2 86% RA with exertion 01/10/09, 2 liters oxygen with exertion and sleep  . Osteoarthritis, hip, bilateral    End stage; R THA  . Pulmonary nodule    CT chest 12/10, June 2011.  No nodule on CT 06/2010.  . Right renal mass 2017   found on CT.  F/u MRI 03/31/15 showed it to be smaller and less likely of any significance but plan for repeat CT with renal protocol 6 mo per radiologist's recommendations.  . Shingles 02/2014   R abd/flank  . Spinal stenosis    Symptomatic (neurogenic claudication)  . Spinal stenosis of lumbar region with neurogenic claudication    2008 MRI (L3-4, L4-5, L5-S1)    Past Surgical History:  Procedure Laterality Date  . ABDOMINAL HYSTERECTOMY  1978   no BSO per pt--nonmalignant reasons  . APPENDECTOMY    . BREAST CYST EXCISION     Last screening mammogram 10/2010 was normal.  . CHOLECYSTECTOMY  2001  . COLONOSCOPY  04/2005  NORMAL-recall 10 yrs  . TONSILLECTOMY    . TOTAL HIP ARTHROPLASTY Right 11/01/2014   Procedure: RIGHT TOTAL HIP ARTHROPLASTY;  Surgeon: Latanya Maudlin, MD;  Location: [facility];  Service: Orthopedics;  Laterality: Right;  . TRANSTHORACIC ECHOCARDIOGRAM  08/2014   EF 60-65%, grade I diast dysfxn  . TUBAL LIGATION  1974    Outpatient Medications Prior to Visit  Medication Sig Dispense Refill  . ADVAIR DISKUS 250-50 MCG/DOSE AEPB INHALE 1 PUFF INTO THE LUNGS TWICE DAILY 1 each 6  . albuterol (PROAIR HFA) 108 (90 BASE) MCG/ACT inhaler Inhale 2 puffs into the lungs every 4 (four) hours as needed. (Patient taking differently: Inhale 2 puffs into the lungs every 4 (four) hours as needed for wheezing  or shortness of breath. ) 1 Inhaler 5  . aspirin 325 MG tablet Take 325 mg by mouth daily.    . cetirizine (ZYRTEC) 10 MG tablet Take 1 tablet (10 mg total) by mouth daily. 30 tablet 11  . DALIRESP 500 MCG TABS tablet TAKE 1 TABLET BY MOUTH EVERY DAY 90 tablet 0  . diazepam (VALIUM) 5 MG tablet TAKE 1 TABLET BY MOUTH EVERY MORNING AND 2 TABLETS BY MOUTH EVERY NIGHT AT BEDTIME 90 tablet 5  . Fluticasone-Salmeterol (ADVAIR DISKUS) 250-50 MCG/DOSE AEPB INHALE 1 PUFF INTO THE LUNGS TWICE DAILY 180 each 3  . Probiotic Product (PROBIOTIC DAILY PO) Take 1 capsule by mouth daily.    Marland Kitchen SPIRIVA HANDIHALER 18 MCG inhalation capsule INHALE 1 CAPSULE VIA HANDIHALER EVERY DAY AT THE SAME TIME 30 capsule 0  . SYNTHROID 88 MCG tablet TAKE 1 TABLET BY MOUTH DAILY 30 tablet 6  . venlafaxine (EFFEXOR) 50 MG tablet TAKE 1 TABLET(50 MG) BY MOUTH TWICE DAILY 60 tablet 11  . amLODipine (NORVASC) 5 MG tablet Take 1 tablet (5 mg total) by mouth daily. (Patient not taking: Reported on 10/24/2015) 90 tablet 3  . omeprazole (PRILOSEC) 20 MG capsule Take 20 mg by mouth daily.      . RESTASIS 0.05 % ophthalmic emulsion Apply 1 drop to eye 2 (two) times daily.  4  . cholestyramine (QUESTRAN) 4 g packet TAKE 1 PACKET THREE TIMES DAILY WITH MEALS (Patient not taking: Reported on 10/24/2015) 270 packet 1  . metroNIDAZOLE (FLAGYL) 500 MG tablet Take 1 tablet (500 mg total) by mouth 3 (three) times daily. 42 tablet 0   No facility-administered medications prior to visit.     Allergies  Allergen Reactions  . Losartan Other (See Comments)    hyperkalemia  . Penicillins Hives, Swelling and Rash    Has patient had a PCN reaction causing immediate rash, facial/tongue/throat swelling, SOB or lightheadedness with hypotension: YES Has patient had a PCN reaction causing severe rash involving mucus membranes or skin necrosis: NO Has patient had a PCN reaction that required hospitalization NO Has patient had a PCN reaction occurring  within the last 10 years: NO If all of the above answers are "NO", then may proceed with Cephalosporin use.   . Cefixime [Kdc:Cefixime] Other (See Comments)    unspecified  . Indocin [Indomethacin] Other (See Comments)    Painful tongue and throat  . Sulfa Antibiotics Other (See Comments)    unspecified  . Ace Inhibitors Other (See Comments)    cough  . Statins Other (See Comments)    Myalgias     ROS As per HPI  PE: Blood pressure (!) 154/91, pulse 71, temperature 97.5 F (36.4 C), temperature source Oral, resp. rate 18, weight  137 lb 12.8 oz (62.5 kg), SpO2 96 %. Gen: Alert, well appearing.  Patient is oriented to person, place, time, and situation. AFFECT: pleasant, lucid thought and speech. CV: RRR, no m/r/g.   LUNGS: CTA bilat, nonlabored resps, good aeration in all lung fields. EXT: no clubbing, cyanosis, or edema.    LABS:  Lab Results  Component Value Date   TSH 2.92 03/20/2015   Lab Results  Component Value Date   WBC 10.0 07/04/2015   HGB 12.1 07/04/2015   HCT 37.2 07/04/2015   MCV 82.9 07/04/2015   PLT 356.0 07/04/2015   Lab Results  Component Value Date   CREATININE 0.78 10/24/2015   BUN 13 10/24/2015   NA 143 10/24/2015   K 4.4 10/24/2015   CL 107 10/24/2015   CO2 29 10/24/2015   Lab Results  Component Value Date   ALT 9 (L) 07/01/2015   AST 16 07/01/2015   ALKPHOS 66 07/01/2015   BILITOT 0.7 07/01/2015   Lab Results  Component Value Date   CHOL 193 06/18/2015   Lab Results  Component Value Date   HDL 61.30 06/18/2015   Lab Results  Component Value Date   LDLCALC 106 (H) 06/18/2015   Lab Results  Component Value Date   TRIG 124.0 06/18/2015   Lab Results  Component Value Date   CHOLHDL 3 06/18/2015    IMPRESSION AND PLAN:  1) HTN; The current medical regimen is effective;  continue present plan and medications. Lytes/cr today.  2) Hypothyroidism: The current medical regimen is effective;  continue present plan and  medications. Recheck TSH at next f/u visit in 4 mo.  3) COPD: stable.  The current medical regimen is effective;  continue present plan and medications.  4) Hx of R renal mass, indeterminate: as per radiologist's recommendations, we'll do a CT abd w/renal protocol to follow this up--ordered today.  An After Visit Summary was printed and given to the patient.  FOLLOW UP: Return in about 4 months (around 02/23/2016) for routine chronic illness f/u.  Signed:  Crissie Sickles, MD           10/26/2015

## 2015-10-26 ENCOUNTER — Encounter (HOSPITAL_BASED_OUTPATIENT_CLINIC_OR_DEPARTMENT_OTHER): Payer: Self-pay

## 2015-10-26 ENCOUNTER — Other Ambulatory Visit: Payer: Self-pay | Admitting: Family Medicine

## 2015-10-26 ENCOUNTER — Ambulatory Visit (HOSPITAL_BASED_OUTPATIENT_CLINIC_OR_DEPARTMENT_OTHER)
Admission: RE | Admit: 2015-10-26 | Discharge: 2015-10-26 | Disposition: A | Discharge status: Home / Self Care | Payer: Medicare Other | Source: Ambulatory Visit | Attending: Family Medicine | Admitting: Family Medicine

## 2015-10-26 DIAGNOSIS — N289 Disorder of kidney and ureter, unspecified: Secondary | ICD-10-CM

## 2015-10-26 DIAGNOSIS — N2889 Other specified disorders of kidney and ureter: Secondary | ICD-10-CM | POA: Diagnosis not present

## 2015-10-26 HISTORY — DX: Disorder of kidney and ureter, unspecified: N28.9

## 2015-10-26 MED ORDER — IOPAMIDOL (ISOVUE-370) INJECTION 76%
100.0000 mL | Freq: Once | INTRAVENOUS | Status: AC | PRN
  Administered 2015-10-26: 100 mL via INTRAVENOUS

## 2015-11-01 DIAGNOSIS — Z23 Encounter for immunization: Secondary | ICD-10-CM | POA: Diagnosis not present

## 2015-11-05 ENCOUNTER — Telehealth: Payer: Self-pay | Admitting: Family Medicine

## 2015-11-05 NOTE — Telephone Encounter (Signed)
Pt calling to check the stats of her referral to [facility].

## 2015-11-06 ENCOUNTER — Telehealth: Payer: Self-pay | Admitting: Family Medicine

## 2015-11-06 ENCOUNTER — Other Ambulatory Visit (INDEPENDENT_AMBULATORY_CARE_PROVIDER_SITE_OTHER): Payer: Medicare Other

## 2015-11-06 DIAGNOSIS — R350 Frequency of micturition: Secondary | ICD-10-CM | POA: Diagnosis not present

## 2015-11-06 LAB — POC URINALSYSI DIPSTICK (AUTOMATED)
Glucose, UA: NEGATIVE
Nitrite, UA: NEGATIVE
Spec Grav, UA: 1.025
Urobilinogen, UA: 0.2
pH, UA: 5.5

## 2015-11-06 MED ORDER — CIPROFLOXACIN HCL 500 MG PO TABS: 500.0000 mg | ORAL_TABLET | Freq: Two times a day (BID) | ORAL | 0 refills | Status: AC

## 2015-11-06 NOTE — Telephone Encounter (Signed)
[facility], they advised they are still working to get patient scheduled.  Returned called to patient and informed her the status of referral and gave her the number to [facility] to call them back if she hasn't heard back from them within the next 24-48 hours.  Patient voiced her understanding and stated she would call their office if she hasn't heard back from them in that time frame.

## 2015-11-06 NOTE — Telephone Encounter (Signed)
Patient notified and will come in to give UA specimen.

## 2015-11-06 NOTE — Telephone Encounter (Signed)
Patient noticed odor to her urine yesterday & today she is having urinary urgency along with burning sensation.

## 2015-11-06 NOTE — Telephone Encounter (Signed)
Pt can come in and leave urine sample for dipstick UA and urine culture, dx is UTI. After she leaves a urine sample, she can start the cipro that I sent to her pharmacy just now.-thx

## 2015-11-08 ENCOUNTER — Encounter: Payer: Self-pay | Admitting: Family Medicine

## 2015-11-09 LAB — URINE CULTURE

## 2015-11-12 DIAGNOSIS — R195 Other fecal abnormalities: Secondary | ICD-10-CM | POA: Diagnosis not present

## 2015-11-26 ENCOUNTER — Encounter: Payer: Self-pay | Admitting: Pulmonary Disease

## 2015-11-26 ENCOUNTER — Ambulatory Visit (INDEPENDENT_AMBULATORY_CARE_PROVIDER_SITE_OTHER): Payer: Medicare Other | Admitting: Pulmonary Disease

## 2015-11-26 VITALS — BP 134/90 | HR 73 | Ht 60.0 in | Wt 143.0 lb

## 2015-11-26 DIAGNOSIS — J449 Chronic obstructive pulmonary disease, unspecified: Secondary | ICD-10-CM

## 2015-11-26 DIAGNOSIS — R05 Cough: Secondary | ICD-10-CM | POA: Diagnosis not present

## 2015-11-26 DIAGNOSIS — J9611 Chronic respiratory failure with hypoxia: Secondary | ICD-10-CM | POA: Diagnosis not present

## 2015-11-26 DIAGNOSIS — R058 Other specified cough: Secondary | ICD-10-CM

## 2015-11-26 NOTE — Progress Notes (Signed)
Current Outpatient Prescriptions on File Prior to Visit  Medication Sig  . ADVAIR DISKUS 250-50 MCG/DOSE AEPB INHALE 1 PUFF INTO THE LUNGS TWICE DAILY  . albuterol (PROAIR HFA) 108 (90 BASE) MCG/ACT inhaler Inhale 2 puffs into the lungs every 4 (four) hours as needed. (Patient taking differently: Inhale 2 puffs into the lungs every 4 (four) hours as needed for wheezing or shortness of breath. )  . aspirin 325 MG tablet Take 325 mg by mouth daily.  . cetirizine (ZYRTEC) 10 MG tablet Take 1 tablet (10 mg total) by mouth daily.  Marland Kitchen DALIRESP 500 MCG TABS tablet TAKE 1 TABLET BY MOUTH EVERY DAY  . diazepam (VALIUM) 5 MG tablet TAKE 1 TABLET BY MOUTH EVERY MORNING AND 2 TABLETS BY MOUTH EVERY NIGHT AT BEDTIME  . Fluticasone-Salmeterol (ADVAIR DISKUS) 250-50 MCG/DOSE AEPB INHALE 1 PUFF INTO THE LUNGS TWICE DAILY  . omeprazole (PRILOSEC) 20 MG capsule Take 20 mg by mouth daily.    . Probiotic Product (PROBIOTIC DAILY PO) Take 1 capsule by mouth daily.  . RESTASIS 0.05 % ophthalmic emulsion Apply 1 drop to eye 2 (two) times daily.  Marland Kitchen SPIRIVA HANDIHALER 18 MCG inhalation capsule INHALE 1 CAPSULE VIA HANDIHALER EVERY DAY AT THE SAME TIME  . SYNTHROID 88 MCG tablet TAKE 1 TABLET BY MOUTH DAILY  . vancomycin (VANCOCIN) 125 MG capsule TK 1 C PO QID FOR 14 DAYS  . venlafaxine (EFFEXOR) 50 MG tablet TAKE 1 TABLET(50 MG) BY MOUTH TWICE DAILY   No current facility-administered medications on file prior to visit.     Chief Complaint  Patient presents with  . Follow-up    Priscilla Cantrell states that her breathing has been doing okay since last OV. Priscilla Cantrell denies any current SOB. Priscilla Cantrell having some throat congestion and PND -- uses Ocean nasal spray. Priscilla Cantrell only uses O2 mostly at night.  Doing well on the Daliresp and Spiriva   Pulmonary tests Spirometry 01/10/09>>FEV1 0.97(52%), FEV1% 47 March 2012>>Daliresp started  Sleep tests PSG 11/14/11 >> AHI 0.3  Cardiac tests Echo 08/30/14 >> EF 60 to [id number], grade 1 diastolic dysfx, PAS 24  mmHG  Past medical history Spinal stenosis, GERD, Hypothyroidism, Depression, Anxiety  Past surgical history, Allergies, Family history, Social history reviewed  Vital signs BP 134/90 (BP Location: Left Arm, Cuff Size: Normal)   Pulse 73   Ht 5' (1.524 m)   Wt 143 lb (64.9 kg)   SpO2 96%   BMI 27.93 kg/m   History of Present Illness: Priscilla Cantrell is a 80 y.o. female former smoker with GOLD 4 COPD, ACE related cough, and hypoxemia on 2 liters oxygen with sleep.  She had trouble with diarrhea over the Summer.  She was seen by Dr. Earlean Shawl.  There didn't seem to be any concern about use of daliresp.  Her diarrhea has improved.  She is not having much cough, wheeze, or sputum.  She denies chest pain.  She uses oxygen at night.  She has been getting more runny nose and post nasal drip.  Physical Exam:  General - Thin HEENT - No sinus tenderness, no oral exudate, no LAN Cardiac - s1s2 no murmur Chest - prolonged exhalation, no wheeze/rales Abdomen - soft, nontender Extremities - no edema Skin - no rashes Neurologic - normal strength Psychiatric - normal mood, behavior   Assessment/Plan:  COPD with chronic bronchitis and emphysema. - continue spiriva, advair, daliresp and prn proair  Chronic respiratory failure with hypoxia. - she is to continue supplemental oxygen  at night  Upper airway cough syndrome. - will have her try OTC flonase - prn zyrtec   Patient Instructions  Can try flonase one spray each nostril daily  Follow up in 6 months   Chesley Mires, MD [place] Pager:  [phone] 05/23/2015 11/26/2015, 3:58 PM

## 2015-11-26 NOTE — Patient Instructions (Signed)
Can try flonase one spray each nostril daily  Follow up in 6 months

## 2015-12-03 ENCOUNTER — Other Ambulatory Visit: Payer: Self-pay | Admitting: Pulmonary Disease

## 2015-12-06 DIAGNOSIS — K5730 Diverticulosis of large intestine without perforation or abscess without bleeding: Secondary | ICD-10-CM | POA: Diagnosis not present

## 2015-12-06 DIAGNOSIS — D122 Benign neoplasm of ascending colon: Secondary | ICD-10-CM | POA: Diagnosis not present

## 2015-12-06 DIAGNOSIS — D123 Benign neoplasm of transverse colon: Secondary | ICD-10-CM | POA: Diagnosis not present

## 2015-12-06 DIAGNOSIS — K635 Polyp of colon: Secondary | ICD-10-CM | POA: Diagnosis not present

## 2015-12-06 DIAGNOSIS — K5790 Diverticulosis of intestine, part unspecified, without perforation or abscess without bleeding: Secondary | ICD-10-CM | POA: Diagnosis not present

## 2015-12-06 DIAGNOSIS — Z1211 Encounter for screening for malignant neoplasm of colon: Secondary | ICD-10-CM | POA: Diagnosis not present

## 2015-12-09 ENCOUNTER — Encounter: Payer: Self-pay | Admitting: Family Medicine

## 2015-12-11 ENCOUNTER — Telehealth: Payer: Self-pay | Admitting: Family Medicine

## 2015-12-11 NOTE — Telephone Encounter (Signed)
Pls rx to her local pharmacy: Metronidazole 500 mg tabs, 1 tab po tid x 14d, #42, no RF.  Call/come in if not improving in 2d on the medication. Encourage increased fluid intake.--thx

## 2015-12-11 NOTE — Telephone Encounter (Signed)
Patient thinks she has C-Diff again.  She started having diarrhea around midnight on Sunday and it has been very persistent and she says it has the same odor.  Patient states that she isn't able to make it to the office.  Can you please advise?

## 2015-12-12 ENCOUNTER — Ambulatory Visit: Payer: Medicare Other | Admitting: Family Medicine

## 2015-12-12 MED ORDER — METRONIDAZOLE 500 MG PO TABS: 500.0000 mg | ORAL_TABLET | Freq: Three times a day (TID) | ORAL | 0 refills | Status: DC

## 2015-12-12 NOTE — Telephone Encounter (Signed)
Patient aware of RX and instructions to come in if no better.  Rx sent.

## 2015-12-14 ENCOUNTER — Ambulatory Visit (INDEPENDENT_AMBULATORY_CARE_PROVIDER_SITE_OTHER): Payer: Medicare Other | Admitting: Family Medicine

## 2015-12-14 ENCOUNTER — Encounter: Payer: Self-pay | Admitting: Family Medicine

## 2015-12-14 VITALS — BP 118/76 | HR 78 | Temp 97.5°F | Resp 18 | Wt 142.8 lb

## 2015-12-14 DIAGNOSIS — N3000 Acute cystitis without hematuria: Secondary | ICD-10-CM

## 2015-12-14 DIAGNOSIS — R3915 Urgency of urination: Secondary | ICD-10-CM | POA: Diagnosis not present

## 2015-12-14 DIAGNOSIS — A0471 Enterocolitis due to Clostridium difficile, recurrent: Secondary | ICD-10-CM | POA: Diagnosis not present

## 2015-12-14 DIAGNOSIS — N390 Urinary tract infection, site not specified: Secondary | ICD-10-CM

## 2015-12-14 LAB — POC URINALSYSI DIPSTICK (AUTOMATED)
Blood, UA: NEGATIVE
Glucose, UA: NEGATIVE
Nitrite, UA: NEGATIVE
Spec Grav, UA: 1.015
Urobilinogen, UA: 1
pH, UA: 6

## 2015-12-14 MED ORDER — CIPROFLOXACIN HCL 500 MG PO TABS: 500.0000 mg | ORAL_TABLET | Freq: Two times a day (BID) | ORAL | 0 refills | Status: AC

## 2015-12-14 NOTE — Progress Notes (Signed)
OFFICE VISIT  12/14/2015   CC:  Chief Complaint  Patient presents with  . Follow-up    HTN - cc: urinary symptoms   HPI:    Patient is a 80 y.o. Caucasian female who presents for urinary complaints. Onset yesterday evening, urinary hesitancy, urgency, can't go very well when she feels like she has to go.  No dysuria.  No hematuria. Mild malaise last few days.  Not having abd pain or n/v.  No fever.   Of note, pt had return of watery, frequent diarrhea recently (about 5d/a) and I eRx'd metronidazole to treat presumptive recurrence of c diff colitis (on 12/11/15).  She reports the diarrhea is occurring less frequently since getting on metronidazole.  She is taking a probiotic.  Past Medical History:  Diagnosis Date  . Asthma   . Chest pain, non-cardiac    Cardiac CT showed no signif obstructive dz, mildly elevated calcium level (Dr. [name]).  . Chronic hypoxemic respiratory failure ([address]) 02/02/2015   2L oxygen 24/7  . COPD (chronic obstructive pulmonary disease) ([place])    spirometry 01/10/09 FEV 0.97(52%), FEV1% 47  . DDD (degenerative disc disease), cervical    1998 MRI C5-6 impingemt (left)  . DDD (degenerative disc disease), lumbar   . Depression with anxiety 04/15/2011  . Diarrheal disease Summer 2017   ? 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).  As of 10/05/15 pt reports GI dx'd her with C diff and rx'd more flagyl and she is also on cholestyramine.  . Diverticulosis of colon    Procto '97 and colonoscopy 2002  . Dysplastic nevus of upper extremity 04/2014   R tricep ([name])  . Family history of adverse reaction to anesthesia    mother died when patient age 10 receiving anesthesia for thyroid surgery  . Fibrocystic breast disease    w/fibroadenoma.  Bx's showed NO atypia (Dr. Margot Chimes).  Mammo neg 2008.  Marland Kitchen GERD (gastroesophageal reflux disease)   . History of double  vision    Ophthalmologist, Dr. Herbert Deaner, is further evaluating this with MRI  orbits and limited brain (myesthenia gravis testing neg 07/2010)  . History of home oxygen therapy    uses 2 liters at hs  . Hypertension    EKG 03/2010 normal  . Hypothyroidism    Hashimoto's, dx'd 1989  . Hypoxemia    O2 86% RA with exertion 01/10/09, 2 liters oxygen with exertion and sleep  . Lesion of right native kidney 2017   found on CT 02/2015.  F/u MRI 03/31/15 showed it to be smaller and less likely of any significance but repeat CT renal protocol in 54mo was recommended by radiology.  This was done 10/26/15 and showed no interval change in the lesion, but b/c the lesion enhances with contrast, radiol suggested urol consultation (b/c papillary renal cell ca could not be excluded)---so this was ordered 10/26/15.  . Osteoarthritis, hip, bilateral    End stage; R THA  . Pulmonary nodule    CT chest 12/10, June 2011.  No nodule on CT 06/2010.  . Shingles 02/2014   R abd/flank  . Spinal stenosis    Symptomatic (neurogenic claudication)  . 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 Upper airway cough syndrome     Past Surgical History:  Procedure Laterality Date  . ABDOMINAL HYSTERECTOMY  1978   no BSO per  pt--nonmalignant reasons  . APPENDECTOMY    . BREAST CYST EXCISION     Last screening mammogram 10/2010 was normal.  . CHOLECYSTECTOMY  2001  . COLONOSCOPY  04/2005; 12/06/2015   2007 NORMAL.  2017 Polyp x 1--path pending as of 12/2015.  Mod sigmoid diverticulosis (Dr. Earlean Shawl).    . TONSILLECTOMY    . TOTAL HIP ARTHROPLASTY Right 11/01/2014   Procedure: RIGHT TOTAL HIP ARTHROPLASTY;  Surgeon: Latanya Maudlin, MD;  Location: [facility];  Service: Orthopedics;  Laterality: Right;  . TRANSTHORACIC ECHOCARDIOGRAM  08/2014   EF 60-65%, grade I diast dysfxn  . TUBAL LIGATION  1974   MEDS: not taking vancomycin listed below Outpatient Medications Prior to Visit   Medication Sig Dispense Refill  . ADVAIR DISKUS 250-50 MCG/DOSE AEPB INHALE 1 PUFF INTO THE LUNGS TWICE DAILY 1 each 6  . albuterol (PROAIR HFA) 108 (90 BASE) MCG/ACT inhaler Inhale 2 puffs into the lungs every 4 (four) hours as needed. (Patient taking differently: Inhale 2 puffs into the lungs every 4 (four) hours as needed for wheezing or shortness of breath. ) 1 Inhaler 5  . aspirin 325 MG tablet Take 325 mg by mouth daily.    . cetirizine (ZYRTEC) 10 MG tablet Take 1 tablet (10 mg total) by mouth daily. 30 tablet 11  . DALIRESP 500 MCG TABS tablet TAKE 1 TABLET BY MOUTH EVERY DAY 90 tablet 0  . diazepam (VALIUM) 5 MG tablet TAKE 1 TABLET BY MOUTH EVERY MORNING AND 2 TABLETS BY MOUTH EVERY NIGHT AT BEDTIME 90 tablet 5  . Fluticasone-Salmeterol (ADVAIR DISKUS) 250-50 MCG/DOSE AEPB INHALE 1 PUFF INTO THE LUNGS TWICE DAILY 180 each 3  . metroNIDAZOLE (FLAGYL) 500 MG tablet Take 1 tablet (500 mg total) by mouth 3 (three) times daily. 42 tablet 0  . omeprazole (PRILOSEC) 20 MG capsule Take 20 mg by mouth daily.      . Probiotic Product (PROBIOTIC DAILY PO) Take 1 capsule by mouth daily.    . RESTASIS 0.05 % ophthalmic emulsion Apply 1 drop to eye 2 (two) times daily.  4  . SPIRIVA HANDIHALER 18 MCG inhalation capsule INHALE 1 CAPSULE VIA HANDIHALER EVERY DAY AT THE SAME TIME 30 capsule 0  . SPIRIVA HANDIHALER 18 MCG inhalation capsule INHALE 1 CAPSULE VIA HANDIHALER EVERY DAY AT THE SAME TIME 30 capsule 3  . SYNTHROID 88 MCG tablet TAKE 1 TABLET BY MOUTH DAILY 30 tablet 6  . venlafaxine (EFFEXOR) 50 MG tablet TAKE 1 TABLET(50 MG) BY MOUTH TWICE DAILY 60 tablet 11  . vancomycin (VANCOCIN) 125 MG capsule TK 1 C PO QID FOR 14 DAYS  0   No facility-administered medications prior to visit.     Allergies  Allergen Reactions  . Losartan Other (See Comments)    hyperkalemia  . Penicillins Hives, Swelling and Rash    Has patient had a PCN reaction causing immediate rash, facial/tongue/throat  swelling, SOB or lightheadedness with hypotension: YES Has patient had a PCN reaction causing severe rash involving mucus membranes or skin necrosis: NO Has patient had a PCN reaction that required hospitalization NO Has patient had a PCN reaction occurring within the last 10 years: NO If all of the above answers are "NO", then may proceed with Cephalosporin use.   . Cefixime [Kdc:Cefixime] Other (See Comments)    unspecified  . Indocin [Indomethacin] Other (See Comments)    Painful tongue and throat  . Sulfa Antibiotics Other (See Comments)    unspecified  . Ace  Inhibitors Other (See Comments)    cough  . Statins Other (See Comments)    Myalgias     ROS As per HPI  PE: Blood pressure 118/76, pulse 78, temperature 97.5 F (36.4 C), temperature source Temporal, resp. rate 18, weight 142 lb 12.8 oz (64.8 kg), SpO2 95 %. Gen: Alert, well appearing.  Patient is oriented to person, place, time, and situation. CV: RRR, no m/r/g.   LUNGS: CTA bilat, nonlabored resps, good aeration in all lung fields. ABD: soft, NT/ND, BS normal  LABS:  CC UA today showed trace ket, 100 mg/dl protein, LEU trace.  Otherwise negative.  Urine color was dark/concentrated yellow and cloudy.    Chemistry      Component Value Date/Time   NA 143 10/24/2015 0932   K 4.4 10/24/2015 0932   CL 107 10/24/2015 0932   CO2 29 10/24/2015 0932   BUN 13 10/24/2015 0932   CREATININE 0.78 10/24/2015 0932   CREATININE 0.68 06/02/2014 1518      Component Value Date/Time   CALCIUM 9.1 10/24/2015 0932   ALKPHOS 66 07/01/2015 1730   AST 16 07/01/2015 1730   ALT 9 (L) 07/01/2015 1730   BILITOT 0.7 07/01/2015 1730       IMPRESSION AND PLAN:  1) Diarrhea: likely C diff recurrence (#2). Improving gradually on metronidazole. Signs/symptoms to call or return for were reviewed and pt expressed understanding.  2) UTI: likely early in the course: send urine for c/s. Add cipro 500 mg bid x 5d.  An After Visit  Summary was printed and given to the patient.  FOLLOW UP: Return if symptoms worsen or fail to improve.  Signed:  Crissie Sickles, MD           12/14/2015

## 2015-12-16 LAB — URINE CULTURE

## 2015-12-21 ENCOUNTER — Other Ambulatory Visit: Payer: Self-pay | Admitting: Family Medicine

## 2015-12-21 NOTE — Telephone Encounter (Signed)
Metronivazole is making patient nauseous. Is there anything she can take for the nausea? Please call

## 2015-12-24 NOTE — Telephone Encounter (Signed)
Left message on home vm for pt to call back.

## 2015-12-24 NOTE — Telephone Encounter (Signed)
Phenergan ordered but pended b/c I don't know which local pharmacy she wants it sent to.

## 2015-12-24 NOTE — Telephone Encounter (Signed)
Please advise. Thanks.  

## 2015-12-25 ENCOUNTER — Emergency Department (HOSPITAL_COMMUNITY)
Admission: EM | Admit: 2015-12-25 | Discharge: 2015-12-25 | Disposition: A | Discharge status: Home / Self Care | Payer: Medicare Other | Attending: Orthopedic Surgery | Admitting: Orthopedic Surgery

## 2015-12-25 ENCOUNTER — Encounter (HOSPITAL_COMMUNITY)
Admission: EM | Disposition: A | Discharge status: Home / Self Care | Payer: Self-pay | Source: Home / Self Care | Attending: Emergency Medicine

## 2015-12-25 ENCOUNTER — Emergency Department (HOSPITAL_COMMUNITY): Payer: Medicare Other

## 2015-12-25 ENCOUNTER — Emergency Department (HOSPITAL_COMMUNITY): Payer: Medicare Other | Admitting: Anesthesiology

## 2015-12-25 ENCOUNTER — Encounter (HOSPITAL_COMMUNITY): Payer: Self-pay | Admitting: Emergency Medicine

## 2015-12-25 ENCOUNTER — Inpatient Hospital Stay: Admit: 2015-12-25 | Payer: Medicare Other | Admitting: Orthopedic Surgery

## 2015-12-25 DIAGNOSIS — Y831 Surgical operation with implant of artificial internal device as the cause of abnormal reaction of the patient, or of later complication, without mention of misadventure at the time of the procedure: Secondary | ICD-10-CM | POA: Insufficient documentation

## 2015-12-25 DIAGNOSIS — F329 Major depressive disorder, single episode, unspecified: Secondary | ICD-10-CM | POA: Diagnosis not present

## 2015-12-25 DIAGNOSIS — I10 Essential (primary) hypertension: Secondary | ICD-10-CM | POA: Diagnosis not present

## 2015-12-25 DIAGNOSIS — T148XXA Other injury of unspecified body region, initial encounter: Secondary | ICD-10-CM | POA: Diagnosis not present

## 2015-12-25 DIAGNOSIS — J449 Chronic obstructive pulmonary disease, unspecified: Secondary | ICD-10-CM | POA: Diagnosis not present

## 2015-12-25 DIAGNOSIS — Z7982 Long term (current) use of aspirin: Secondary | ICD-10-CM | POA: Insufficient documentation

## 2015-12-25 DIAGNOSIS — T84020A Dislocation of internal right hip prosthesis, initial encounter: Secondary | ICD-10-CM | POA: Insufficient documentation

## 2015-12-25 DIAGNOSIS — Z79899 Other long term (current) drug therapy: Secondary | ICD-10-CM | POA: Diagnosis not present

## 2015-12-25 DIAGNOSIS — E039 Hypothyroidism, unspecified: Secondary | ICD-10-CM | POA: Insufficient documentation

## 2015-12-25 DIAGNOSIS — Z87891 Personal history of nicotine dependence: Secondary | ICD-10-CM | POA: Insufficient documentation

## 2015-12-25 DIAGNOSIS — S73004A Unspecified dislocation of right hip, initial encounter: Secondary | ICD-10-CM | POA: Diagnosis not present

## 2015-12-25 DIAGNOSIS — F419 Anxiety disorder, unspecified: Secondary | ICD-10-CM | POA: Diagnosis not present

## 2015-12-25 DIAGNOSIS — Z96641 Presence of right artificial hip joint: Secondary | ICD-10-CM | POA: Diagnosis not present

## 2015-12-25 DIAGNOSIS — Z419 Encounter for procedure for purposes other than remedying health state, unspecified: Secondary | ICD-10-CM

## 2015-12-25 DIAGNOSIS — M25551 Pain in right hip: Secondary | ICD-10-CM | POA: Diagnosis not present

## 2015-12-25 HISTORY — PX: HIP CLOSED REDUCTION: SHX983

## 2015-12-25 SURGERY — CLOSED REDUCTION, HIP
Anesthesia: Choice | Site: Hip | Laterality: Right

## 2015-12-25 MED ORDER — FENTANYL CITRATE (PF) 100 MCG/2ML IJ SOLN
INTRAMUSCULAR | Status: AC
  Filled 2015-12-25: qty 2

## 2015-12-25 MED ORDER — PROPOFOL 10 MG/ML IV BOLUS
INTRAVENOUS | Status: AC
  Filled 2015-12-25: qty 20

## 2015-12-25 MED ORDER — PROPOFOL 10 MG/ML IV BOLUS
INTRAVENOUS | Status: DC | PRN
  Administered 2015-12-25: 100 mg via INTRAVENOUS

## 2015-12-25 MED ORDER — KETAMINE HCL 10 MG/ML IJ SOLN
INTRAMUSCULAR | Status: AC | PRN
  Administered 2015-12-25: 10 mg via INTRAVENOUS
  Administered 2015-12-25: 30 mg via INTRAVENOUS

## 2015-12-25 MED ORDER — MEPERIDINE HCL 50 MG/ML IJ SOLN: 6.2500 mg | INTRAMUSCULAR | Status: DC | PRN

## 2015-12-25 MED ORDER — LACTATED RINGERS IV SOLN
INTRAVENOUS | Status: DC | PRN
  Administered 2015-12-25: 17:00:00 via INTRAVENOUS

## 2015-12-25 MED ORDER — KETAMINE HCL-SODIUM CHLORIDE 100-0.9 MG/10ML-% IV SOSY
1.0000 mg/kg | PREFILLED_SYRINGE | Freq: Once | INTRAVENOUS | Status: DC
  Filled 2015-12-25: qty 10

## 2015-12-25 MED ORDER — PHENYLEPHRINE 40 MCG/ML (10ML) SYRINGE FOR IV PUSH (FOR BLOOD PRESSURE SUPPORT)
PREFILLED_SYRINGE | INTRAVENOUS | Status: AC
  Filled 2015-12-25: qty 10

## 2015-12-25 MED ORDER — ONDANSETRON HCL 4 MG/2ML IJ SOLN
INTRAMUSCULAR | Status: AC
  Filled 2015-12-25: qty 2

## 2015-12-25 MED ORDER — LACTATED RINGERS IV SOLN: INTRAVENOUS | Status: DC

## 2015-12-25 MED ORDER — FENTANYL CITRATE (PF) 100 MCG/2ML IJ SOLN: 25.0000 ug | INTRAMUSCULAR | Status: DC | PRN

## 2015-12-25 MED ORDER — SUCCINYLCHOLINE CHLORIDE 20 MG/ML IJ SOLN
INTRAMUSCULAR | Status: DC | PRN
  Administered 2015-12-25: 140 mg via INTRAVENOUS

## 2015-12-25 MED ORDER — PROPOFOL 10 MG/ML IV BOLUS
INTRAVENOUS | Status: AC | PRN
  Administered 2015-12-25: 30 mg via INTRAVENOUS
  Administered 2015-12-25: 10 mg via INTRAVENOUS

## 2015-12-25 MED ORDER — HYDROCODONE-ACETAMINOPHEN 5-325 MG PO TABS: 1.0000 | ORAL_TABLET | Freq: Four times a day (QID) | ORAL | 0 refills | Status: DC | PRN

## 2015-12-25 MED ORDER — PROPOFOL 10 MG/ML IV BOLUS
100.0000 mg | Freq: Once | INTRAVENOUS | Status: DC
  Filled 2015-12-25: qty 20

## 2015-12-25 MED ORDER — HYDROMORPHONE HCL 1 MG/ML IJ SOLN
0.5000 mg | Freq: Once | INTRAMUSCULAR | Status: AC
  Administered 2015-12-25: 0.5 mg via INTRAVENOUS
  Filled 2015-12-25: qty 1

## 2015-12-25 MED ORDER — ONDANSETRON HCL 4 MG/2ML IJ SOLN: 4.0000 mg | Freq: Once | INTRAMUSCULAR | Status: DC | PRN

## 2015-12-25 MED ORDER — ONDANSETRON HCL 4 MG/2ML IJ SOLN
INTRAMUSCULAR | Status: DC | PRN
  Administered 2015-12-25: 4 mg via INTRAVENOUS

## 2015-12-25 MED ORDER — LIDOCAINE HCL (CARDIAC) 20 MG/ML IV SOLN
INTRAVENOUS | Status: DC | PRN
  Administered 2015-12-25: 100 mg via INTRAVENOUS

## 2015-12-25 SURGICAL SUPPLY — 1 items: IMMOBILIZER KNEE 16 UNIV (MISCELLANEOUS) ×2 IMPLANT

## 2015-12-25 NOTE — ED Notes (Addendum)
Patient wiped down with CHG wipes. [name] belongings given to patient's visitor. OR tech at bedside to transport patient to operating room.

## 2015-12-25 NOTE — Discharge Instructions (Signed)
Aspirin 325 mg po daily x 4 weeks

## 2015-12-25 NOTE — Anesthesia Preprocedure Evaluation (Addendum)
Anesthesia Evaluation  Patient identified by MRN, date of birth, ID band Patient awake    Reviewed: Allergy & Precautions, H&P , [name] Chart, lab work & pertinent test results, reviewed documented beta blocker date and time   Airway Mallampati: II  TM Distance: >3 FB Neck ROM: full    Dental no notable dental hx.    Pulmonary former smoker,    Pulmonary exam normal breath sounds clear to auscultation       Cardiovascular hypertension, (-) Cardiac Defibrillator  Rhythm:regular Rate:Normal     Neuro/Psych    GI/Hepatic   Endo/Other    Renal/GU      Musculoskeletal   Abdominal   Peds  Hematology   Anesthesia Other Findings Hypertension   COPD........FEV 0.97(52%), FEV1% 47 Hypoxemia O2  86% RA with exertion 01/10/09, 2 liters oxygen with exertion and sleep  GERD    Hypothyroidism   Family history of adverse reaction to anesthesiamother died when patient age 70 receiving anesthesia for thyroid surgery Chronic hypoxemic respiratory failure ([place]) 02/02/2015 2L oxygen 24/7  Shingles    Reproductive/Obstetrics                            Anesthesia Physical Anesthesia Plan  ASA: III  Anesthesia Plan:    Post-op Pain Management:    Induction: Intravenous  Airway Management Planned: LMA  Additional Equipment:   Intra-op Plan:   Post-operative Plan:   Informed Consent: I have reviewed the patients History and Physical, chart, labs and discussed the procedure including the risks, benefits and alternatives for the proposed anesthesia with the patient or authorized representative who has indicated his/her understanding and acceptance.   Dental Advisory Given and Dental advisory given  Plan Discussed with: CRNA and Surgeon  Anesthesia Plan Comments: (Discussed GA with LMA, possible sore throat, potential need to switch to ETT, N/V, pulmonary aspiration. Questions answered. )         Anesthesia Quick Evaluation

## 2015-12-25 NOTE — ED Notes (Signed)
Bed: [id number] Expected date:  Expected time:  Means of arrival:  Comments: EMS-hip dislocation

## 2015-12-25 NOTE — Anesthesia Postprocedure Evaluation (Signed)
Anesthesia Post Note  Patient: Priscilla Cantrell  Procedure(s) Performed: Procedure(s) (LRB): CLOSED REDUCTION HIP (Right)  Patient location during evaluation: PACU Anesthesia Type: General Level of consciousness: sedated Pain management: satisfactory to patient Vital Signs Assessment: post-procedure vital signs reviewed and stable Respiratory status: spontaneous breathing Cardiovascular status: stable Anesthetic complications: no    Last Vitals:  Vitals:   12/25/15 1815 12/25/15 1829  BP: (!) 165/63 134/82  Pulse: 69 75  Resp: 15 16  Temp: 36.9 C 37.1 C    Last Pain:  Vitals:   12/25/15 1829  TempSrc:   PainSc: 0-No pain                 Leroy Trim EDWARD

## 2015-12-25 NOTE — ED Triage Notes (Addendum)
Per EMS, patient states she was reaching for something in her kitchen when she felt her right hip pop. Patient reports pain/numbness to right hip. States she cannot move her leg. Hx of right hip replacement. Patient received 200 mcg of fentanyl IV en route with EMS.

## 2015-12-25 NOTE — ED Notes (Signed)
X-ray at bedside

## 2015-12-25 NOTE — ED Provider Notes (Signed)
[facility] DEPT Provider Note   CSN: [id number] Arrival date & time: 12/25/15  [id number]     History   Chief Complaint Chief Complaint  Patient presents with  . Hip Pain    Right    HPI Priscilla Cantrell is a 80 y.o. female.  80 year old female presents with acute onset of right hip pain which occurred when she was bending over. Patient unable to bear weight after the pain started. Has a history of right hip replacement done last year. No prior history of joint dislocation. Pain characterized as sharp and worse with any kind of movement. Pain is better with remaining still Denies any numbness or tingling to her right foot. Called EMS and was given 200 g of fentanyl and transported here.      Past Medical History:  Diagnosis Date  . Asthma   . Chest pain, non-cardiac    Cardiac CT showed no signif obstructive dz, mildly elevated calcium level (Dr. [name]).  . Chronic hypoxemic respiratory failure ([place]) 02/02/2015   2L oxygen 24/7  . COPD (chronic obstructive pulmonary disease) ([place])    spirometry 01/10/09 FEV 0.97(52%), FEV1% 47  . DDD (degenerative disc disease), cervical    1998 MRI C5-6 impingemt (left)  . DDD (degenerative disc disease), lumbar   . Depression with anxiety 04/15/2011  . Diarrheal disease Summer 2017   ? 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).  As of 10/05/15 pt reports GI dx'd her with C diff and rx'd more flagyl and she is also on cholestyramine.  . Diverticulosis of colon    Procto '97 and colonoscopy 2002  . Dysplastic nevus of upper extremity 04/2014   R tricep ([name])  . Family history of adverse reaction to anesthesia    mother died when patient age 86 receiving anesthesia for thyroid surgery  . Fibrocystic breast disease    w/fibroadenoma.  Bx's showed NO atypia (Dr. Margot Chimes).  Mammo neg 2008.  Marland Kitchen GERD (gastroesophageal reflux disease)   .  History of double vision    Ophthalmologist, Dr. Herbert Deaner, is further evaluating this with MRI  orbits and limited brain (myesthenia gravis testing neg 07/2010)  . History of home oxygen therapy    uses 2 liters at hs  . Hypertension    EKG 03/2010 normal  . Hypothyroidism    Hashimoto's, dx'd 1989  . Hypoxemia    O2 86% RA with exertion 01/10/09, 2 liters oxygen with exertion and sleep  . Lesion of right native kidney 2017   found on CT 02/2015.  F/u MRI 03/31/15 showed it to be smaller and less likely of any significance but repeat CT renal protocol in 23mo was recommended by radiology.  This was done 10/26/15 and showed no interval change in the lesion, but b/c the lesion enhances with contrast, radiol suggested urol consultation (b/c papillary renal cell ca could not be excluded)---so this was ordered 10/26/15.  . Osteoarthritis, hip, bilateral    End stage; R THA  . Pulmonary nodule    CT chest 12/10, June 2011.  No nodule on CT 06/2010.  . Shingles 02/2014   R abd/flank  . Spinal stenosis    Symptomatic (neurogenic claudication)  . 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 Upper airway cough syndrome     Patient Active Problem List   Diagnosis Date Noted  . Postherpetic  neuralgia 03/07/2015  . Constipation 02/28/2015  . Renal mass 02/28/2015  . Acute pyelonephritis 02/20/2015  . Oral thrush 02/08/2015  . Chronic hypoxemic respiratory failure ([place]) 02/02/2015  . SOB (shortness of breath)   . Acute respiratory failure ([place]) 01/24/2015  . Essential hypertension 01/24/2015  . UTI (urinary tract infection) 01/09/2015  . Pain and swelling of right lower leg 01/09/2015  . H/O total hip arthroplasty 11/01/2014  . Spinal stenosis, lumbar region, with neurogenic claudication 01/04/2014  . Osteoarthritis, hip, bilateral 07/27/2013  . Sciatica associated with disorder of lumbosacral spine 04/19/2013  . Atrophic vaginitis 01/12/2012  .  Periodic limb movement disorder 11/24/2011  . Hypoxia 05/22/2011  . Depression with anxiety 04/15/2011  . Chronic venous insufficiency 07/29/2010  . Hypothyroidism due to Hashimoto's thyroiditis 01/07/2010  . SPINAL STENOSIS 01/07/2010  . COPD with chronic bronchitis ([place]) 08/09/2007    Past Surgical History:  Procedure Laterality Date  . ABDOMINAL HYSTERECTOMY  1978   no BSO per pt--nonmalignant reasons  . APPENDECTOMY    . BREAST CYST EXCISION     Last screening mammogram 10/2010 was normal.  . CHOLECYSTECTOMY  2001  . COLONOSCOPY  04/2005; 12/06/2015   2007 NORMAL.  2017 Polyp x 1--path pending as of 12/2015.  Mod sigmoid diverticulosis (Dr. Earlean Shawl).    . TONSILLECTOMY    . TOTAL HIP ARTHROPLASTY Right 11/01/2014   Procedure: RIGHT TOTAL HIP ARTHROPLASTY;  Surgeon: Latanya Maudlin, MD;  Location: [facility];  Service: Orthopedics;  Laterality: Right;  . TRANSTHORACIC ECHOCARDIOGRAM  08/2014   EF 60-65%, grade I diast dysfxn  . TUBAL LIGATION  1974    OB History    No data available       Home Medications    Prior to Admission medications   Medication Sig Start Date End Date Taking? Authorizing Provider  ADVAIR DISKUS 250-50 MCG/DOSE AEPB INHALE 1 PUFF INTO THE LUNGS TWICE DAILY 09/24/15   Noralee Space, MD  albuterol (PROAIR HFA) 108 (90 BASE) MCG/ACT inhaler Inhale 2 puffs into the lungs every 4 (four) hours as needed. Patient taking differently: Inhale 2 puffs into the lungs every 4 (four) hours as needed for wheezing or shortness of breath.  10/07/13   [name], MD  aspirin 325 MG tablet Take 325 mg by mouth daily.    Historical Provider, MD  cetirizine (ZYRTEC) 10 MG tablet Take 1 tablet (10 mg total) by mouth daily. 10/11/14   [name], MD  DALIRESP 500 MCG TABS tablet TAKE 1 TABLET BY MOUTH EVERY DAY 10/15/15   Chesley Mires, MD  diazepam (VALIUM) 5 MG tablet TAKE 1 TABLET BY MOUTH EVERY MORNING AND 2 TABLETS BY MOUTH EVERY NIGHT AT BEDTIME 07/27/15   [name], MD  Fluticasone-Salmeterol (ADVAIR DISKUS) 250-50 MCG/DOSE AEPB INHALE 1 PUFF INTO THE LUNGS TWICE DAILY 03/21/15   Chesley Mires, MD  metroNIDAZOLE (FLAGYL) 500 MG tablet Take 1 tablet (500 mg total) by mouth 3 (three) times daily. 12/12/15   [name], MD  omeprazole (PRILOSEC) 20 MG capsule Take 20 mg by mouth daily.      Historical Provider, MD  Probiotic Product (PROBIOTIC DAILY PO) Take 1 capsule by mouth daily.    Historical Provider, MD  RESTASIS 0.05 % ophthalmic emulsion Apply 1 drop to eye 2 (two) times daily. 03/14/14   Historical Provider, MD  SPIRIVA HANDIHALER 18 MCG inhalation capsule INHALE 1 CAPSULE VIA HANDIHALER EVERY DAY AT THE SAME TIME 09/14/15   [name]  Sood, MD  SPIRIVA HANDIHALER 18 MCG inhalation capsule INHALE 1 CAPSULE VIA HANDIHALER EVERY DAY AT THE SAME TIME 12/03/15   Chesley Mires, MD  SYNTHROID 88 MCG tablet TAKE 1 TABLET BY MOUTH DAILY 09/18/15   [name], MD  venlafaxine (EFFEXOR) 50 MG tablet TAKE 1 TABLET(50 MG) BY MOUTH TWICE DAILY 08/21/15   [name], MD    Family History Family History  Problem Relation Age of Onset  . Goiter Mother   . Psoriasis Father     od liver  . Cirrhosis Father   . Diabetes Daughter     Type 2  . Cancer Paternal Aunt     stomach?  . Stroke Maternal Grandfather   . Stroke Paternal Grandmother   . Hypertension Paternal Grandmother   . Hernia Paternal Grandmother   . Cancer Paternal Grandfather     lung    Social History Social History  Substance Use Topics  . Smoking status: Former Smoker    Packs/day: 2.00    Years: 35.00    Types: Cigarettes    Quit date: 02/03/1990  . Smokeless tobacco: Never Used  . Alcohol use Yes     Comment: Rarely     Allergies   Losartan; Penicillins; Cefixime [kdc:cefixime]; Indocin [indomethacin]; Sulfa antibiotics; Ace inhibitors; and Statins   Review of Systems Review of Systems  All other systems reviewed and are negative.    Physical Exam Updated  Vital Signs BP 178/73 (BP Location: Right Arm)   Pulse (!) 54   Temp 97.6 F (36.4 C) (Oral)   Resp 18   Ht 5' (1.524 m)   Wt 61.2 kg   SpO2 90%   BMI 26.37 kg/m   Physical Exam  Constitutional: She is oriented to person, place, and time. She appears well-developed and well-nourished.  Non-toxic appearance. No distress.  HENT:  Head: Normocephalic and atraumatic.  Eyes: Conjunctivae, EOM and lids are normal. Pupils are equal, round, and reactive to light.  Neck: Normal range of motion. Neck supple. No tracheal deviation present. No thyroid mass present.  Cardiovascular: Normal rate, regular rhythm and normal heart sounds.  Exam reveals no gallop.   No murmur heard. Pulmonary/Chest: Effort normal and breath sounds normal. No stridor. No respiratory distress. She has no decreased breath sounds. She has no wheezes. She has no rhonchi. She has no rales.  Abdominal: Soft. Normal appearance and bowel sounds are normal. She exhibits no distension. There is no tenderness. There is no rebound and no CVA tenderness.  Musculoskeletal: Normal range of motion. She exhibits no edema or tenderness.  Right lower extremity is shortened and rotated. Pain to palpation at right lateral hip. Right foot dorsalis pedis pulse 2+. Neurovascular status is intact  Neurological: She is alert and oriented to person, place, and time. She has normal strength. No cranial nerve deficit or sensory deficit. GCS eye subscore is 4. GCS verbal subscore is 5. GCS motor subscore is 6.  Skin: Skin is warm and dry. No abrasion and no rash noted.  Psychiatric: She has a normal mood and affect. Her speech is normal and behavior is normal.  Nursing note and vitals reviewed.    ED Treatments / Results  Labs (all labs ordered are listed, but only abnormal results are displayed) Labs Reviewed - No data to display  EKG  EKG Interpretation None       Radiology No results found.  Procedures .Sedation Date/Time:  12/25/2015 12:20 PM Performed by: Lacretia Leigh Authorized  by: Zenia Resides, Kailea Dannemiller   Consent:    Consent obtained:  Written   Consent given by:  Patient   Risks discussed:  Inadequate sedation Indications:    Procedure performed:  Dislocation reduction   Procedure necessitating sedation performed by:  Physician performing sedation   Intended level of sedation:  Moderate (conscious sedation) Pre-sedation assessment:    NPO status caution: urgency dictates proceeding with non-ideal NPO status     ASA classification: class 2 - patient with mild systemic disease     Neck mobility: normal     Mouth opening:  3 or more finger widths   Mallampati score:  I - soft palate, uvula, fauces, pillars visible   Pre-sedation assessments completed and reviewed: airway patency     History of difficult intubation: no     Pre-sedation assessment completed:  12/25/2015 11:30 AM Procedure details (see MAR for exact dosages):    Sedation start time:  12/25/2015 11:52 AM   Preoxygenation:  Nasal cannula   Sedation:  Propofol   Intra-procedure monitoring:  Blood pressure monitoring, continuous capnometry, continuous pulse oximetry, cardiac monitor, frequent LOC assessments and frequent vital sign checks   Intra-procedure events: none     Sedation end time:  12/25/2015 11:45 AM Post-procedure details:    Post-sedation assessment completed:  12/25/2015 12:15 PM   Attendance: Constant attendance by certified staff until patient recovered     Recovery: Patient returned to pre-procedure baseline     Post-sedation assessments completed and reviewed: airway patency     Patient tolerance:  Tolerated well, no immediate complications Reduction of dislocation Date/Time: 12/25/2015 11:30 AM Performed by: Lacretia Leigh Authorized by: Lacretia Leigh  Consent: Written consent obtained. Risks and benefits: risks, benefits and alternatives were discussed Consent given by: patient Patient understanding: patient states  understanding of the procedure being performed Patient identity confirmed: arm band Time out: Immediately prior to procedure a "time out" was called to verify the correct patient, procedure, equipment, support staff and site/side marked as required. Patient tolerance: Patient tolerated the procedure well with no immediate complications Comments: Attempted to reduce the hip with traction and countertraction unsuccessfully    (including critical care time)  Medications Ordered in ED Medications - No data to display   Initial Impression / Assessment and Plan / ED Course  I have reviewed the triage vital signs and the nursing notes.  Pertinent labs & imaging results that were available during my care of the patient were reviewed by me and considered in my medical decision making (see chart for details).  Clinical Course     Attempted to reduce the patient's hip but this was unsuccessful. Consult orthopedics and spoke to Dr. Stann Mainland who will come and see the patient  Final Clinical Impressions(s) / ED Diagnoses   Final diagnoses:  None    New Prescriptions New Prescriptions   No medications on file     Lacretia Leigh, MD 12/25/15 1225

## 2015-12-25 NOTE — Transfer of Care (Signed)
Immediate Anesthesia Transfer of Care Note  Patient: Priscilla Cantrell  Procedure(s) Performed: Procedure(s): CLOSED REDUCTION HIP (Right)  Patient Location: PACU  Anesthesia Type:General  Level of Consciousness:  sedated, patient cooperative and responds to stimulation  Airway & Oxygen Therapy:Patient Spontanous Breathing and Patient connected to face mask oxgen  Post-op Assessment:  Report given to PACU RN and Post -op Vital signs reviewed and stable  Post vital signs:  Reviewed and stable  Last Vitals:  Vitals:   12/25/15 1600 12/25/15 1745  BP: 147/66 (!) 145/80  Pulse: (!) 57 71  Resp: 19 20  Temp:  [id number] C    Complications: No apparent anesthesia complications

## 2015-12-25 NOTE — Consult Note (Signed)
ORTHOPAEDIC CONSULTATION  REQUESTING PHYSICIAN: Nicholes Stairs, MD  PCP:  [name], MD  Chief Complaint: right hip dislocation  HPI: Priscilla Cantrell is a 80 y.o. female who complains of  she complains of right hip pain and immobility following an event earlier today. She presented to the emergency department was found to have a right hip dislocation. They had failed attempts to reduce that 2. On my exam today she continues to complain of a shortened externally rotated right lower extremity. No complaints of distal paresthesias. No motor deficits.  Past Medical History:  Diagnosis Date  . Asthma   . Chest pain, non-cardiac    Cardiac CT showed no signif obstructive dz, mildly elevated calcium level (Dr. [name]).  . Chronic hypoxemic respiratory failure ([place]) 02/02/2015   2L oxygen 24/7  . COPD (chronic obstructive pulmonary disease) ([place])    spirometry 01/10/09 FEV 0.97(52%), FEV1% 47  . DDD (degenerative disc disease), cervical    1998 MRI C5-6 impingemt (left)  . DDD (degenerative disc disease), lumbar   . Depression with anxiety 04/15/2011  . Diarrheal disease Summer 2017   ? 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).  As of 10/05/15 pt reports GI dx'd her with C diff and rx'd more flagyl and she is also on cholestyramine.  . Diverticulosis of colon    Procto '97 and colonoscopy 2002  . Dysplastic nevus of upper extremity 04/2014   R tricep ([name])  . Family history of adverse reaction to anesthesia    mother died when patient age 86 receiving anesthesia for thyroid surgery  . Fibrocystic breast disease    w/fibroadenoma.  Bx's showed NO atypia (Dr. Margot Chimes).  Mammo neg 2008.  Marland Kitchen GERD (gastroesophageal reflux disease)   . History of double vision    Ophthalmologist, Dr. Herbert Deaner, is further evaluating this with MRI  orbits and limited brain (myesthenia gravis  testing neg 07/2010)  . History of home oxygen therapy    uses 2 liters at hs  . Hypertension    EKG 03/2010 normal  . Hypothyroidism    Hashimoto's, dx'd 1989  . Hypoxemia    O2 86% RA with exertion 01/10/09, 2 liters oxygen with exertion and sleep  . Lesion of right native kidney 2017   found on CT 02/2015.  F/u MRI 03/31/15 showed it to be smaller and less likely of any significance but repeat CT renal protocol in 92mo was recommended by radiology.  This was done 10/26/15 and showed no interval change in the lesion, but b/c the lesion enhances with contrast, radiol suggested urol consultation (b/c papillary renal cell ca could not be excluded)---so this was ordered 10/26/15.  . Osteoarthritis, hip, bilateral    End stage; R THA  . Pulmonary nodule    CT chest 12/10, June 2011.  No nodule on CT 06/2010.  . Shingles 02/2014   R abd/flank  . Spinal stenosis    Symptomatic (neurogenic claudication)  . 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 Upper airway cough syndrome    Past Surgical History:  Procedure Laterality Date  . ABDOMINAL HYSTERECTOMY  1978   no BSO per pt--nonmalignant reasons  . APPENDECTOMY    . BREAST CYST EXCISION     Last screening mammogram 10/2010 was normal.  . CHOLECYSTECTOMY  2001  . COLONOSCOPY  04/2005; 12/06/2015  2007 NORMAL.  2017 Polyp x 1--path pending as of 12/2015.  Mod sigmoid diverticulosis (Dr. Earlean Shawl).    . TONSILLECTOMY    . TOTAL HIP ARTHROPLASTY Right 11/01/2014   Procedure: RIGHT TOTAL HIP ARTHROPLASTY;  Surgeon: Latanya Maudlin, MD;  Location: [facility];  Service: Orthopedics;  Laterality: Right;  . TRANSTHORACIC ECHOCARDIOGRAM  08/2014   EF 60-65%, grade I diast dysfxn  . TUBAL LIGATION  1974   Social History   Social History  . Marital status: Widowed    Spouse name: N/A  . Number of children: 1  . Years of education: N/A   Occupational History  . SECRETARY [place]    Social History Main Topics  . Smoking status: Former Smoker    Packs/day: 2.00    Years: 35.00    Types: Cigarettes    Quit date: 02/03/1990  . Smokeless tobacco: Never Used  . Alcohol use Yes     Comment: Rarely  . Drug use: No  . Sexual activity: No   Other Topics Concern  . None   Social History Narrative   Widowed since fall 2014   Lives in [place], [place].     Worked as Research officer, trade [employer], retired 2017.        Family History  Problem Relation Age of Onset  . Goiter Mother   . Psoriasis Father     od liver  . Cirrhosis Father   . Diabetes Daughter     Type 2  . Cancer Paternal Aunt     stomach?  . Stroke Maternal Grandfather   . Stroke Paternal Grandmother   . Hypertension Paternal Grandmother   . Hernia Paternal Grandmother   . Cancer Paternal Grandfather     lung   Allergies  Allergen Reactions  . Losartan Other (See Comments)    hyperkalemia  . Penicillins Hives, Swelling and Rash    Has patient had a PCN reaction causing immediate rash, facial/tongue/throat swelling, SOB or lightheadedness with hypotension: YES Has patient had a PCN reaction causing severe rash involving mucus membranes or skin necrosis: NO Has patient had a PCN reaction that required hospitalization NO Has patient had a PCN reaction occurring within the last 10 years: NO If all of the above answers are "NO", then may proceed with Cephalosporin use.   . Cefixime [Kdc:Cefixime] Other (See Comments)    unspecified  . Indocin [Indomethacin] Other (See Comments)    Painful tongue and throat  . Sulfa Antibiotics Other (See Comments)    unspecified  . Ace Inhibitors Other (See Comments)    cough  . Statins Other (See Comments)    Myalgias    Prior to Admission medications   Medication Sig Start Date End Date Taking? Authorizing Provider  acetaminophen (TYLENOL) 500 MG tablet Take 500 mg by mouth every 6 (six) hours as needed for mild pain, moderate pain, fever or headache.   Yes  Historical Provider, MD  ADVAIR DISKUS 250-50 MCG/DOSE AEPB INHALE 1 PUFF INTO THE LUNGS TWICE DAILY 09/24/15  Yes Noralee Space, MD  albuterol (PROAIR HFA) 108 (90 BASE) MCG/ACT inhaler Inhale 2 puffs into the lungs every 4 (four) hours as needed. Patient taking differently: Inhale 2 puffs into the lungs every 4 (four) hours as needed for wheezing or shortness of breath.  10/07/13  Yes [name], MD  aspirin EC 81 MG tablet Take 81 mg by mouth daily.   Yes Historical Provider, MD  cetirizine (ZYRTEC) 10 MG tablet Take 1  tablet (10 mg total) by mouth daily. Patient taking differently: Take 10-20 mg by mouth daily as needed for allergies.  10/11/14  Yes [name], MD  DALIRESP 500 MCG TABS tablet TAKE 1 TABLET BY MOUTH EVERY DAY 10/15/15  Yes Chesley Mires, MD  diazepam (VALIUM) 5 MG tablet Take 2.5 mg by mouth 2 (two) times daily.   Yes Historical Provider, MD  metroNIDAZOLE (FLAGYL) 500 MG tablet Take 1 tablet (500 mg total) by mouth 3 (three) times daily. Patient taking differently: Take 500 mg by mouth 3 (three) times daily. Started 11/08 for 12/12/15  Yes [name], MD  polyvinyl alcohol-povidone (REFRESH) 1.4-0.6 % ophthalmic solution Place 1 drop into both eyes as needed (for drys eyes).   Yes Historical Provider, MD  Probiotic Product (PROBIOTIC DAILY PO) Take 2 capsules by mouth daily.    Yes Historical Provider, MD  SPIRIVA HANDIHALER 18 MCG inhalation capsule INHALE 1 CAPSULE VIA HANDIHALER EVERY DAY AT THE SAME TIME 12/03/15  Yes Chesley Mires, MD  SYNTHROID 88 MCG tablet TAKE 1 TABLET BY MOUTH DAILY 09/18/15  Yes [name], MD  venlafaxine (EFFEXOR) 50 MG tablet TAKE 1 TABLET(50 MG) BY MOUTH TWICE DAILY Patient taking differently: TAKE 25-50 mg BY MOUTH twice a day 08/21/15  Yes [name], MD  diazepam (VALIUM) 5 MG tablet TAKE 1 TABLET BY MOUTH EVERY MORNING AND 2 TABLETS BY MOUTH EVERY NIGHT AT BEDTIME Patient not taking: Reported on 12/25/2015 07/27/15   [name], MD  Fluticasone-Salmeterol (ADVAIR DISKUS) 250-50 MCG/DOSE AEPB INHALE 1 PUFF INTO THE LUNGS TWICE DAILY Patient not taking: Reported on 12/25/2015 03/21/15   Chesley Mires, MD  SPIRIVA HANDIHALER 18 MCG inhalation capsule INHALE 1 CAPSULE VIA HANDIHALER EVERY DAY AT [place] Patient not taking: Reported on 12/25/2015 09/14/15   Chesley Mires, MD   Dg Hip Port Unilat W Or Wo Pelvis 1 View Right  Result Date: 12/25/2015 CLINICAL DATA:  Right total hip prosthesis dislocation EXAM: DG HIP 1V PORT RIGHT COMPARISON:  Study obtained earlier in the day FINDINGS: Frontal view shows persistent dislocation of the total hip prosthesis on the right with the femoral component displaced laterally and superiorly with respect the acetabular component. No fracture evident. IMPRESSION: Persistent total hip prosthesis dislocation, not appreciably changed from earlier in the day. Electronically Signed   By: Lowella Grip III M.D.   On: 12/25/2015 11:53   Dg Hip Unilat  With Pelvis 2-3 Views Right  Result Date: 12/25/2015 CLINICAL DATA:  Hip dislocation. EXAM: DG HIP (WITH OR WITHOUT PELVIS) 2-3V RIGHT COMPARISON:  CT 10/26/2015. FINDINGS: Right hip for dislocation. Patient has had a prior total hip replacement. The hardware appears to be intact. No fracture noted. IMPRESSION: Right hip dislocation. Electronically Signed   By: Marcello Moores  Register   On: 12/25/2015 10:23    Positive ROS: All other systems have been reviewed and were otherwise negative with the exception of those mentioned in the HPI and as above.  Physical Exam: General: Alert, no acute distress Cardiovascular: No pedal edema Respiratory: No cyanosis, no use of accessory musculature GI: No organomegaly, abdomen is soft and non-tender Skin: No lesions in the area of chief complaint Neurologic: Sensation intact distally Psychiatric: Patient is competent for consent with normal mood and affect Lymphatic: No axillary or cervical  lymphadenopathy  MUSCULOSKELETAL: Right lower extremity examination reveals a shortened and externally rotated limb. Sensation is intact to light touch throughout. Motor is intact. 2+ dorsalis  pedis pulse.    Assessment: Right total hip arthroplasty dislocation.    Plan: Plan for OR closed reduction of right hip. We will place her in a knee immobilizer postoperatively. She will be discharged from PACU home with follow-up in our clinic with Dr. Gladstone Lighter in 1-2 weeks.   Nicholes Stairs, MD Cell [phone]    12/25/2015 5:10 PM

## 2015-12-25 NOTE — Op Note (Signed)
Date of Surgery: 12/25/2015  INDICATIONS: Priscilla Cantrell is a 80 y.o.-year-old female with a right total hip dislocation;  The patient did consent to the procedure after discussion of the risks and benefits.  PREOPERATIVE DIAGNOSIS: Right total hip replacement dislocation  POSTOPERATIVE DIAGNOSIS: Same.  PROCEDURE: closed reduction of right hip  SURGEON: Geralynn Rile, M.D.  ASSIST: none.  ANESTHESIA:  general  IV FLUIDS AND URINE: See anesthesia.  ESTIMATED BLOOD LOSS: 0 mL.  IMPLANTS: none  DRAINS: none  COMPLICATIONS: None.  DESCRIPTION OF PROCEDURE: The patient was brought to the operating room and placed supine on the operating table.  The patient had been signed prior to the procedure and this was documented. The patient had the anesthesia placed by the anesthesiologist.  A time-out was performed to confirm that this was the correct patient, site, side and location.   Once adequate relaxation was noted from the anesthesia team we proceeded with closed reduction maneuvers of the right hip. The hip was flexed and internally rotated and adducted, and gentle longitudinal traction was applied to the hip. An audible and palpable clunk was noted. The leg was extended back into resting position and noted to be symmetric in length with the contralateral limb as well as in rotation. A plain x-ray was obtained using a portable plate which did confirm concentric reduction of the right total hip arthroplasty.  The patient was awakened from the anesthetic and transported back to the PACU in stable condition. A knee immobilizer was placed prior to leaving the OR on the right knee.   POSTOPERATIVE PLAN: Priscilla Cantrell can be weightbearing as tolerated to the right lower extremity in her knee immobilizer. She will be in the knee immobilizer at all times unless bathing her clothing up until her follow-up appointment in 2 weeks. We'll place her on an aspirin a day for DVT prophylaxis for the next  few weeks.

## 2015-12-26 ENCOUNTER — Encounter (HOSPITAL_COMMUNITY): Payer: Self-pay | Admitting: Orthopedic Surgery

## 2015-12-26 NOTE — Telephone Encounter (Signed)
Spoke to pt. She stated that she had to go to the hospital yesterday because she dislocated her hip and is now is a brace from her ankle up. She stated that she feels a little better and only has 3 more days of the metronivazole, so she is going to "tough it out". She stated that she does not need the phenergan.

## 2015-12-31 ENCOUNTER — Telehealth: Payer: Self-pay | Admitting: Family Medicine

## 2015-12-31 NOTE — Telephone Encounter (Signed)
Noted  

## 2015-12-31 NOTE — Telephone Encounter (Signed)
FYI

## 2015-12-31 NOTE — Telephone Encounter (Signed)
[name] right hip came out of joint last Tues. Went to ER they got it back in but patient will contact Dr. Gladstone Lighter to make an appointment. No CB requested.

## 2016-01-01 ENCOUNTER — Encounter (HOSPITAL_COMMUNITY): Payer: Self-pay | Admitting: Orthopedic Surgery

## 2016-01-08 DIAGNOSIS — Z4789 Encounter for other orthopedic aftercare: Secondary | ICD-10-CM | POA: Diagnosis not present

## 2016-01-08 DIAGNOSIS — T84020S Dislocation of internal right hip prosthesis, sequela: Secondary | ICD-10-CM | POA: Diagnosis not present

## 2016-01-27 ENCOUNTER — Other Ambulatory Visit: Payer: Self-pay | Admitting: Family Medicine

## 2016-01-27 ENCOUNTER — Other Ambulatory Visit: Payer: Self-pay | Admitting: Nurse Practitioner

## 2016-01-27 ENCOUNTER — Other Ambulatory Visit: Payer: Self-pay | Admitting: Pulmonary Disease

## 2016-01-29 NOTE — Telephone Encounter (Signed)
RF request for diazepam LOV: 10/24/15 Next ov: 02/22/16 Last written: 07/27/15 #90 w/ 5RF  Please advise. Thanks.

## 2016-01-29 NOTE — Telephone Encounter (Signed)
Rx faxed

## 2016-02-07 DIAGNOSIS — T84020S Dislocation of internal right hip prosthesis, sequela: Secondary | ICD-10-CM | POA: Diagnosis not present

## 2016-02-22 ENCOUNTER — Ambulatory Visit (INDEPENDENT_AMBULATORY_CARE_PROVIDER_SITE_OTHER): Payer: Medicare HMO | Admitting: Family Medicine

## 2016-02-22 ENCOUNTER — Encounter: Payer: Self-pay | Admitting: Family Medicine

## 2016-02-22 ENCOUNTER — Ambulatory Visit: Payer: Medicare Other | Admitting: Family Medicine

## 2016-02-22 VITALS — BP 157/95 | HR 83 | Temp 98.1°F | Resp 16 | Wt 134.0 lb

## 2016-02-22 DIAGNOSIS — R8290 Unspecified abnormal findings in urine: Secondary | ICD-10-CM | POA: Diagnosis not present

## 2016-02-22 DIAGNOSIS — N390 Urinary tract infection, site not specified: Secondary | ICD-10-CM | POA: Diagnosis not present

## 2016-02-22 DIAGNOSIS — R319 Hematuria, unspecified: Secondary | ICD-10-CM

## 2016-02-22 DIAGNOSIS — R300 Dysuria: Secondary | ICD-10-CM

## 2016-02-22 LAB — POC URINALSYSI DIPSTICK (AUTOMATED)
Bilirubin, UA: NEGATIVE
Glucose, UA: NEGATIVE
Ketones, UA: NEGATIVE
Nitrite, UA: POSITIVE
Spec Grav, UA: 1.025
Urobilinogen, UA: 0.2
pH, UA: 7

## 2016-02-22 MED ORDER — ESTROGENS, CONJUGATED 0.625 MG/GM VA CREA: 1.0000 | TOPICAL_CREAM | Freq: Every day | VAGINAL | 0 refills | Status: DC

## 2016-02-22 MED ORDER — CIPROFLOXACIN HCL 500 MG PO TABS: 500.0000 mg | ORAL_TABLET | Freq: Two times a day (BID) | ORAL | 0 refills | Status: DC

## 2016-02-22 NOTE — Progress Notes (Signed)
Priscilla Cantrell , 02-20-1935, 81 y.o., female MRN: [id number] Patient Care Team    Relationship Specialty Notifications Start End  [name], MD PCP - General   01/16/10    Comment: Priscilla Gula, MD Consulting Physician Cardiology  06/06/10   Monna Fam, MD Consulting Physician Ophthalmology  01/29/11   Chesley Mires, MD Consulting Physician Pulmonary Disease  12/21/12   Latanya Maudlin, MD Consulting Physician Orthopedic Surgery  09/26/14   Richmond Campbell, MD Consulting Physician Gastroenterology  08/30/15     CC: urinary discomfort.  Subjective: Pt presents for an acute OV with complaints of urethral discomfort.  She states her urinary is not burning, but even the touch of her pants are irritation to the area. She did endorse frequency of urination 2 days ago. She denies nausea, vomit, diarrhea or abd pain. She did notice this morning her urine was red in color, but feels it is improving as the day progresses. She has an appt with urology next week concerning frequent UTI/kidney mass.    Allergies  Allergen Reactions  . Losartan Other (See Comments)    hyperkalemia  . Penicillins Hives, Swelling and Rash    Has patient had a PCN reaction causing immediate rash, facial/tongue/throat swelling, SOB or lightheadedness with hypotension: YES Has patient had a PCN reaction causing severe rash involving mucus membranes or skin necrosis: NO Has patient had a PCN reaction that required hospitalization NO Has patient had a PCN reaction occurring within the last 10 years: NO If all of the above answers are "NO", then may proceed with Cephalosporin use.   . Cefixime [Kdc:Cefixime] Other (See Comments)    unspecified  . Indocin [Indomethacin] Other (See Comments)    Painful tongue and throat  . Sulfa Antibiotics Other (See Comments)    unspecified  . Ace Inhibitors Other (See Comments)    cough  . Statins Other (See Comments)    Myalgias    Social History  Substance  Use Topics  . Smoking status: Former Smoker    Packs/day: 2.00    Years: 35.00    Types: Cigarettes    Quit date: 02/03/1990  . Smokeless tobacco: Never Used  . Alcohol use Yes     Comment: Rarely   Past Medical History:  Diagnosis Date  . Asthma   . Chest pain, non-cardiac    Cardiac CT showed no signif obstructive dz, mildly elevated calcium level (Dr. [name]).  . Chronic hypoxemic respiratory failure ([place]) 02/02/2015   2L oxygen 24/7  . COPD (chronic obstructive pulmonary disease) ([place])    spirometry 01/10/09 FEV 0.97(52%), FEV1% 47  . DDD (degenerative disc disease), cervical    1998 MRI C5-6 impingemt (left)  . DDD (degenerative disc disease), lumbar   . Depression with anxiety 04/15/2011  . Diarrheal disease Summer 2017   ? 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).  As of 10/05/15 pt reports GI dx'd her with C diff and rx'd more flagyl and she is also on cholestyramine.  . Diverticulosis of colon    Procto '97 and colonoscopy 2002  . Dysplastic nevus of upper extremity 04/2014   R tricep ([name])  . Family history of adverse reaction to anesthesia    mother died when patient age 45 receiving anesthesia for thyroid surgery  . Fibrocystic breast disease    w/fibroadenoma.  Bx's showed NO atypia (Dr. Margot Chimes).  Mammo  neg 2008.  Marland Kitchen GERD (gastroesophageal reflux disease)   . History of double vision    Ophthalmologist, Dr. Herbert Deaner, is further evaluating this with MRI  orbits and limited brain (myesthenia gravis testing neg 07/2010)  . History of home oxygen therapy    uses 2 liters at hs  . Hypertension    EKG 03/2010 normal  . Hypothyroidism    Hashimoto's, dx'd 1989  . Hypoxemia    O2 86% RA with exertion 01/10/09, 2 liters oxygen with exertion and sleep  . Lesion of right native kidney 2017   found on CT 02/2015.  F/u MRI 03/31/15 showed it to be smaller and less likely of  any significance but repeat CT renal protocol in 63mo was recommended by radiology.  This was done 10/26/15 and showed no interval change in the lesion, but b/c the lesion enhances with contrast, radiol suggested urol consultation (b/c papillary renal cell ca could not be excluded)---so this was ordered 10/26/15.  . Osteoarthritis, hip, bilateral    End stage; R THA  . Pulmonary nodule    CT chest 12/10, June 2011.  No nodule on CT 06/2010.  . Shingles 02/2014   R abd/flank  . Spinal stenosis    Symptomatic (neurogenic claudication)  . 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 Upper airway cough syndrome    Past Surgical History:  Procedure Laterality Date  . ABDOMINAL HYSTERECTOMY  1978   no BSO per pt--nonmalignant reasons  . APPENDECTOMY    . BREAST CYST EXCISION     Last screening mammogram 10/2010 was normal.  . CHOLECYSTECTOMY  2001  . COLONOSCOPY  04/2005; 12/06/2015   2007 NORMAL.  2017 Polyp x 1--path pending as of 12/2015.  Mod sigmoid diverticulosis (Dr. Earlean Shawl).    Marland Kitchen HIP CLOSED REDUCTION Right 12/25/2015   Procedure: CLOSED REDUCTION HIP;  Surgeon: Nicholes Stairs, MD;  Location: [facility];  Service: Orthopedics;  Laterality: Right;  . TONSILLECTOMY    . TOTAL HIP ARTHROPLASTY Right 11/01/2014   Procedure: RIGHT TOTAL HIP ARTHROPLASTY;  Surgeon: Latanya Maudlin, MD;  Location: [facility];  Service: Orthopedics;  Laterality: Right;  . TRANSTHORACIC ECHOCARDIOGRAM  08/2014   EF 60-65%, grade I diast dysfxn  . TUBAL LIGATION  1974   Family History  Problem Relation Age of Onset  . Goiter Mother   . Psoriasis Father     od liver  . Cirrhosis Father   . Diabetes Daughter     Type 2  . Cancer Paternal Aunt     stomach?  . Stroke Maternal Grandfather   . Stroke Paternal Grandmother   . Hypertension Paternal Grandmother   . Hernia Paternal Grandmother   . Cancer Paternal Grandfather     lung   Allergies as of 02/22/2016       Reactions   Losartan Other (See Comments)   hyperkalemia   Penicillins Hives, Swelling, Rash   Has patient had a PCN reaction causing immediate rash, facial/tongue/throat swelling, SOB or lightheadedness with hypotension: YES Has patient had a PCN reaction causing severe rash involving mucus membranes or skin necrosis: NO Has patient had a PCN reaction that required hospitalization NO Has patient had a PCN reaction occurring within the last 10 years: NO If all of the above answers are "NO", then may proceed with Cephalosporin use.   Cefixime [kdc:cefixime] Other (See Comments)   unspecified   Indocin [indomethacin] Other (See Comments)  Painful tongue and throat   Sulfa Antibiotics Other (See Comments)   unspecified   Ace Inhibitors Other (See Comments)   cough   Statins Other (See Comments)   Myalgias      Medication List       Accurate as of 02/22/16  2:26 PM. Always use your most recent med list.          acetaminophen 500 MG tablet Commonly known as:  TYLENOL Take 500 mg by mouth every 6 (six) hours as needed for mild pain, moderate pain, fever or headache.   albuterol 108 (90 Base) MCG/ACT inhaler Commonly known as:  PROAIR HFA Inhale 2 puffs into the lungs every 4 (four) hours as needed.   aspirin EC 81 MG tablet Take 81 mg by mouth daily.   cetirizine 10 MG tablet Commonly known as:  ZYRTEC Take 1 tablet (10 mg total) by mouth daily.   DALIRESP 500 MCG Tabs tablet Generic drug:  roflumilast TAKE 1 TABLET BY MOUTH EVERY DAY   diazepam 5 MG tablet Commonly known as:  VALIUM Take 2.5 mg by mouth 2 (two) times daily.   diazepam 5 MG tablet Commonly known as:  VALIUM TAKE 1 TABLET BY MOUTH EVERY MORNING AND 2 TABLETS BY MOUTH EVERY NIGHT AT BEDTIME   diazepam 5 MG tablet Commonly known as:  VALIUM TAKE 1 TABLET BY MOUTH EVERY MORNING AND 2 TABLETS BY MOUTH EVERY NIGHT AT BEDTIME   Fluticasone-Salmeterol 250-50 MCG/DOSE Aepb Commonly known as:  ADVAIR  DISKUS INHALE 1 PUFF INTO THE LUNGS TWICE DAILY   ADVAIR DISKUS 250-50 MCG/DOSE Aepb Generic drug:  Fluticasone-Salmeterol INHALE 1 PUFF INTO THE LUNGS TWICE DAILY   HYDROcodone-acetaminophen 5-325 MG tablet Commonly known as:  NORCO Take 1-2 tablets by mouth every 6 (six) hours as needed for moderate pain.   metroNIDAZOLE 500 MG tablet Commonly known as:  FLAGYL Take 1 tablet (500 mg total) by mouth 3 (three) times daily.   PROBIOTIC DAILY PO Take 2 capsules by mouth daily.   REFRESH 1.4-0.6 % ophthalmic solution Generic drug:  polyvinyl alcohol-povidone Place 1 drop into both eyes as needed (for drys eyes).   SPIRIVA HANDIHALER 18 MCG inhalation capsule Generic drug:  tiotropium INHALE 1 CAPSULE VIA HANDIHALER EVERY DAY AT THE SAME TIME   SPIRIVA HANDIHALER 18 MCG inhalation capsule Generic drug:  tiotropium INHALE 1 CAPSULE VIA HANDIHALER EVERY DAY AT THE SAME TIME   SYNTHROID 88 MCG tablet Generic drug:  levothyroxine TAKE 1 TABLET BY MOUTH DAILY   venlafaxine 50 MG tablet Commonly known as:  EFFEXOR TAKE 1 TABLET(50 MG) BY MOUTH TWICE DAILY       No results found for this or any previous visit (from the past 24 hour(s)). No results found.   ROS: Negative, with the exception of above mentioned in HPI   Objective:  BP (!) 157/95 (BP Location: Right Arm, Patient Position: Sitting, Cuff Size: Normal)   Pulse 83   Temp 98.1 F (36.7 C) (Oral)   Resp 16   Wt 134 lb (60.8 kg)   SpO2 96%   BMI 26.17 kg/m  Body mass index is 26.17 kg/m. Gen: Afebrile. No acute distress. Nontoxic in appearance, well developed, well nourished.  HENT: AT. [place].  MMM Abd: Soft. NTND. BS presnet. no Masses palpated. No rebound or guarding.  MSK: No CVA tenderness Neuro: Normal gait. PERLA. EOMi. Alert. Oriented x3  GU: No rash, mild area of redness surrounding urethral opening. No lesions noted. Atrophy  present.   Results for orders placed or performed in visit on 02/22/16  (from the past 24 hour(s))  POCT Urinalysis Dipstick (Automated)     Status: Abnormal   Collection Time: 02/22/16  2:26 PM  Result Value Ref Range   Color, UA amber    Clarity, UA cloudy    Glucose, UA negative    Bilirubin, UA negative    Ketones, UA negative    Spec Grav, UA 1.025    Blood, UA large    pH, UA 7.0    Protein, UA 100mg     Urobilinogen, UA 0.2    Nitrite, UA positive    Leukocytes, UA moderate (2+) (A) Negative     Assessment/Plan: ELEFTHERIA WEAVIL is a 81 y.o. female present for acute OV for  Dysuria/Abnormal urine/Urinary tract infection with hematuria, site unspecified - POCT Urinalysis Dipstick (Automated) - Urine Culture - cipro bid x 5 days - discussed use of very mall amount of estrogen cream daily over urethral area. She has a mild irritation to this area and complains of discomfort there today.  - She has a urology appt scheduled this month.  - F/u PRN  electronically signed by:  Howard Pouch, DO  [place]

## 2016-02-22 NOTE — Patient Instructions (Signed)
cipro called in for UTI and cream called into to start over urethral area daily.

## 2016-02-22 NOTE — Progress Notes (Signed)
Pre visit review using our clinic review tool, if applicable. No additional management support is needed unless otherwise documented below in the visit note. 

## 2016-02-25 ENCOUNTER — Telehealth: Payer: Self-pay | Admitting: Family Medicine

## 2016-02-25 LAB — URINE CULTURE

## 2016-02-25 NOTE — Telephone Encounter (Signed)
Please call pt: - her urine culture grew a bacteria called Proteus and the antibiotic she was provided will work for this particular bacteria.

## 2016-02-25 NOTE — Telephone Encounter (Signed)
Spoke with patient reviewed results and instructions . Patient verbalized understanding. 

## 2016-02-27 ENCOUNTER — Ambulatory Visit (INDEPENDENT_AMBULATORY_CARE_PROVIDER_SITE_OTHER): Payer: Medicare HMO | Admitting: Family Medicine

## 2016-02-27 ENCOUNTER — Encounter: Payer: Self-pay | Admitting: Family Medicine

## 2016-02-27 VITALS — BP 166/88 | HR 71 | Temp 97.4°F | Resp 16 | Ht 60.0 in | Wt 136.5 lb

## 2016-02-27 DIAGNOSIS — F418 Other specified anxiety disorders: Secondary | ICD-10-CM

## 2016-02-27 DIAGNOSIS — E039 Hypothyroidism, unspecified: Secondary | ICD-10-CM | POA: Diagnosis not present

## 2016-02-27 DIAGNOSIS — I10 Essential (primary) hypertension: Secondary | ICD-10-CM | POA: Diagnosis not present

## 2016-02-27 DIAGNOSIS — F32A Depression, unspecified: Secondary | ICD-10-CM

## 2016-02-27 DIAGNOSIS — N390 Urinary tract infection, site not specified: Secondary | ICD-10-CM

## 2016-02-27 DIAGNOSIS — F419 Anxiety disorder, unspecified: Secondary | ICD-10-CM

## 2016-02-27 DIAGNOSIS — N289 Disorder of kidney and ureter, unspecified: Secondary | ICD-10-CM

## 2016-02-27 DIAGNOSIS — F329 Major depressive disorder, single episode, unspecified: Secondary | ICD-10-CM

## 2016-02-27 LAB — BASIC METABOLIC PANEL
BUN: 16 mg/dL (ref 7–25)
CO2: 26 mmol/L (ref 20–31)
Calcium: 9.6 mg/dL (ref 8.6–10.4)
Chloride: 107 mmol/L (ref 98–110)
Creat: 0.84 mg/dL (ref 0.60–0.93)
Glucose, Bld: 95 mg/dL (ref 65–99)
Potassium: 3.9 mmol/L (ref 3.5–5.3)
Sodium: 141 mmol/L (ref 135–146)

## 2016-02-27 LAB — TSH: TSH: 1.87 mIU/L

## 2016-02-27 NOTE — Progress Notes (Signed)
Pre visit review using our clinic review tool, if applicable. No additional management support is needed unless otherwise documented below in the visit note. 

## 2016-02-27 NOTE — Progress Notes (Signed)
OFFICE VISIT  02/27/2016   CC:  Chief Complaint  Patient presents with  . Follow-up    [facility],    HPI:    Patient is a 81 y.o. Caucasian female who presents for 4 mo f/u chronic anxiety/depression, HTN, hypothyroidism. Was here 5 d/a for UTI sx's and saw Dr. Raoul Pitch, was put on cipro 500mg  bid x 5d and the urine clx grew proteus that was sensitive to cipro.  All UTI sx's are gone now.  She has her initial appt with urology tomorrow (recurrent UTI and hx of indistinct renal mass.  HTN: not doing any home monitoring.  No HA's, no palp's, no CP, no SOB. Hypothyroidism: takes med every morning on empty stomach with other meds--has always taken it this way. Anxiety/depression: doing fairly stable.  Her daughter worries her a lot.  Quinnisha gets easily frustrated.  No crying spells. Takes one 5mg  valium bid prn--she doesn't take this very regularly.  ROS: has one formed bm daily--no diarrhea, no blood in stool.  No abd pain.     Past Medical History:  Diagnosis Date  . Asthma   . Chest pain, non-cardiac    Cardiac CT showed no signif obstructive dz, mildly elevated calcium level (Dr. [name]).  . Chronic hypoxemic respiratory failure ([place]) 02/02/2015   2L oxygen 24/7  . COPD (chronic obstructive pulmonary disease) ([place])    spirometry 01/10/09 FEV 0.97(52%), FEV1% 47  . DDD (degenerative disc disease), cervical    1998 MRI C5-6 impingemt (left)  . DDD (degenerative disc disease), lumbar   . Depression with anxiety 04/15/2011  . Diarrheal disease Summer 2017   ? 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).  As of 10/05/15 pt reports GI dx'd her with C diff and rx'd more flagyl and she is also on cholestyramine.  . Diverticulosis of colon    Procto '97 and colonoscopy 2002  . Dysplastic nevus of upper extremity 04/2014   R tricep ([name])  . Family history of adverse reaction to anesthesia     mother died when patient age 10 receiving anesthesia for thyroid surgery  . Fibrocystic breast disease    w/fibroadenoma.  Bx's showed NO atypia (Dr. Margot Chimes).  Mammo neg 2008.  Marland Kitchen GERD (gastroesophageal reflux disease)   . History of double vision    Ophthalmologist, Dr. Herbert Deaner, is further evaluating this with MRI  orbits and limited brain (myesthenia gravis testing neg 07/2010)  . History of home oxygen therapy    uses 2 liters at hs  . Hypertension    EKG 03/2010 normal  . Hypothyroidism    Hashimoto's, dx'd 1989  . Hypoxemia    O2 86% RA with exertion 01/10/09, 2 liters oxygen with exertion and sleep  . Lesion of right native kidney 2017   found on CT 02/2015.  F/u MRI 03/31/15 showed it to be smaller and less likely of any significance but repeat CT renal protocol in 62mo was recommended by radiology.  This was done 10/26/15 and showed no interval change in the lesion, but b/c the lesion enhances with contrast, radiol suggested urol consultation (b/c papillary renal cell ca could not be excluded)---so this was ordered 10/26/15.  . Osteoarthritis, hip, bilateral    End stage; R THA  . Pulmonary nodule    CT chest 12/10, June 2011.  No nodule on CT 06/2010.  . Shingles 02/2014   R abd/flank  . Spinal stenosis  Symptomatic (neurogenic claudication)  . 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 Upper airway cough syndrome     Past Surgical History:  Procedure Laterality Date  . ABDOMINAL HYSTERECTOMY  1978   no BSO per pt--nonmalignant reasons  . APPENDECTOMY    . BREAST CYST EXCISION     Last screening mammogram 10/2010 was normal.  . CHOLECYSTECTOMY  2001  . COLONOSCOPY  04/2005; 12/06/2015   2007 NORMAL.  2017 Polyp x 1--path pending as of 12/2015.  Mod sigmoid diverticulosis (Dr. Earlean Shawl).    Marland Kitchen HIP CLOSED REDUCTION Right 12/25/2015   Procedure: CLOSED REDUCTION HIP;  Surgeon: Nicholes Stairs, MD;  Location: [facility];  Service:  Orthopedics;  Laterality: Right;  . TONSILLECTOMY    . TOTAL HIP ARTHROPLASTY Right 11/01/2014   Procedure: RIGHT TOTAL HIP ARTHROPLASTY;  Surgeon: Latanya Maudlin, MD;  Location: [facility];  Service: Orthopedics;  Laterality: Right;  . TRANSTHORACIC ECHOCARDIOGRAM  08/2014   EF 60-65%, grade I diast dysfxn  . TUBAL LIGATION  1974    Outpatient Medications Prior to Visit  Medication Sig Dispense Refill  . acetaminophen (TYLENOL) 500 MG tablet Take 500 mg by mouth every 6 (six) hours as needed for mild pain, moderate pain, fever or headache.    . albuterol (PROAIR HFA) 108 (90 BASE) MCG/ACT inhaler Inhale 2 puffs into the lungs every 4 (four) hours as needed. (Patient taking differently: Inhale 2 puffs into the lungs every 4 (four) hours as needed for wheezing or shortness of breath. ) 1 Inhaler 5  . aspirin EC 81 MG tablet Take 81 mg by mouth daily.    . cetirizine (ZYRTEC) 10 MG tablet Take 1 tablet (10 mg total) by mouth daily. (Patient taking differently: Take 10-20 mg by mouth daily as needed for allergies. ) 30 tablet 11  . ciprofloxacin (CIPRO) 500 MG tablet Take 1 tablet (500 mg total) by mouth 2 (two) times daily. 10 tablet 0  . conjugated estrogens (PREMARIN) vaginal cream Place 1 Applicatorful vaginally daily. Place a small amount of estrogen cream on finger and place over urethral area daily 42.5 g 0  . DALIRESP 500 MCG TABS tablet TAKE 1 TABLET BY MOUTH EVERY DAY 90 tablet 0  . diazepam (VALIUM) 5 MG tablet TAKE 1 TABLET BY MOUTH EVERY MORNING AND 2 TABLETS BY MOUTH EVERY NIGHT AT BEDTIME 90 tablet 5  . Fluticasone-Salmeterol (ADVAIR DISKUS) 250-50 MCG/DOSE AEPB INHALE 1 PUFF INTO THE LUNGS TWICE DAILY 180 each 3  . polyvinyl alcohol-povidone (REFRESH) 1.4-0.6 % ophthalmic solution Place 1 drop into both eyes as needed (for drys eyes).    . Probiotic Product (PROBIOTIC DAILY PO) Take 2 capsules by mouth daily.     Marland Kitchen SPIRIVA HANDIHALER 18 MCG inhalation capsule INHALE 1 CAPSULE VIA  HANDIHALER EVERY DAY AT THE SAME TIME 30 capsule 3  . SYNTHROID 88 MCG tablet TAKE 1 TABLET BY MOUTH DAILY 30 tablet 6  . venlafaxine (EFFEXOR) 50 MG tablet TAKE 1 TABLET(50 MG) BY MOUTH TWICE DAILY (Patient taking differently: TAKE 25-50 mg BY MOUTH twice a day) 60 tablet 11  . ADVAIR DISKUS 250-50 MCG/DOSE AEPB INHALE 1 PUFF INTO THE LUNGS TWICE DAILY (Patient not taking: Reported on 02/27/2016) 1 each 6  . diazepam (VALIUM) 5 MG tablet Take 2.5 mg by mouth 2 (two) times daily.    . diazepam (VALIUM) 5 MG tablet TAKE 1 TABLET BY MOUTH EVERY MORNING AND 2  TABLETS BY MOUTH EVERY NIGHT AT BEDTIME (Patient not taking: Reported on 02/27/2016) 90 tablet 5  . HYDROcodone-acetaminophen (NORCO) 5-325 MG tablet Take 1-2 tablets by mouth every 6 (six) hours as needed for moderate pain. (Patient not taking: Reported on 02/27/2016) 60 tablet 0  . metroNIDAZOLE (FLAGYL) 500 MG tablet Take 1 tablet (500 mg total) by mouth 3 (three) times daily. (Patient not taking: Reported on 02/27/2016) 42 tablet 0  . SPIRIVA HANDIHALER 18 MCG inhalation capsule INHALE 1 CAPSULE VIA HANDIHALER EVERY DAY AT THE SAME TIME (Patient not taking: Reported on 02/27/2016) 30 capsule 0   No facility-administered medications prior to visit.     Allergies  Allergen Reactions  . Losartan Other (See Comments)    hyperkalemia  . Penicillins Hives, Swelling and Rash    Has patient had a PCN reaction causing immediate rash, facial/tongue/throat swelling, SOB or lightheadedness with hypotension: YES Has patient had a PCN reaction causing severe rash involving mucus membranes or skin necrosis: NO Has patient had a PCN reaction that required hospitalization NO Has patient had a PCN reaction occurring within the last 10 years: NO If all of the above answers are "NO", then may proceed with Cephalosporin use.   . Cefixime [Kdc:Cefixime] Other (See Comments)    unspecified  . Indocin [Indomethacin] Other (See Comments)    Painful tongue and  throat  . Sulfa Antibiotics Other (See Comments)    unspecified  . Ace Inhibitors Other (See Comments)    cough  . Statins Other (See Comments)    Myalgias     ROS As per HPI  PE: Blood pressure (!) 166/88, pulse 71, temperature 97.4 F (36.3 C), temperature source Oral, resp. rate 16, height 5' (1.524 m), weight 136 lb 8 oz (61.9 kg), SpO2 94 %. Body mass index is 26.66 kg/m.  Gen: Alert, well appearing.  Patient is oriented to person, place, time, and situation. AFFECT: pleasant, lucid thought and speech. CV: RRR, no m/r/g.   LUNGS: CTA bilat, nonlabored resps, good aeration in all lung fields. EXT: no clubbing, cyanosis, or edema.    LABS:    Chemistry      Component Value Date/Time   NA 143 10/24/2015 0932   K 4.4 10/24/2015 0932   CL 107 10/24/2015 0932   CO2 29 10/24/2015 0932   BUN 13 10/24/2015 0932   CREATININE 0.78 10/24/2015 0932   CREATININE 0.68 06/02/2014 1518      Component Value Date/Time   CALCIUM 9.1 10/24/2015 0932   ALKPHOS 66 07/01/2015 1730   AST 16 07/01/2015 1730   ALT 9 (L) 07/01/2015 1730   BILITOT 0.7 07/01/2015 1730     Lab Results  Component Value Date   TSH 2.92 03/20/2015   Lab Results  Component Value Date   WBC 10.0 07/04/2015   HGB 12.1 07/04/2015   HCT 37.2 07/04/2015   MCV 82.9 07/04/2015   PLT 356.0 07/04/2015   Lab Results  Component Value Date   CHOL 193 06/18/2015   HDL 61.30 06/18/2015   LDLCALC 106 (H) 06/18/2015   LDLDIRECT 131.3 03/07/2010   TRIG 124.0 06/18/2015   CHOLHDL 3 06/18/2015   Lab Results  Component Value Date   HGBA1C 5.7 10/29/2011   IMPRESSION AND PLAN:  1) Anxiety/depression: stable.  Some situational issues but she deals with these well.  2) Hypothyroidism: The current medical regimen is effective;  continue present plan and medications. Check TSH today.  3) HTN: systolic bp up here the  last 2 visits.  She will restart home bp monitoring and she'll call if avg>140/90.  4) UTI:  resolved s/p tx with cipro.  5) Right kidney lesion: imaging could not r/o papillary renal cell carcinoma, so she gets consult with urologist tomorrow.  An After Visit Summary was printed and given to the patient.  FOLLOW UP: Return in about 4 months (around 06/26/2016) for annual CPE (fasting).  Signed:  Crissie Sickles, MD           02/27/2016

## 2016-03-05 ENCOUNTER — Other Ambulatory Visit: Payer: Self-pay | Admitting: Family Medicine

## 2016-03-05 MED ORDER — AMLODIPINE BESYLATE 5 MG PO TABS: 5.0000 mg | ORAL_TABLET | Freq: Every day | ORAL | 1 refills | Status: DC

## 2016-03-05 NOTE — Telephone Encounter (Signed)
Patient brought in a list of BP's 02/29/16 -  149/85 03/01/16 - 172/100, 158/77 03/02/16 - 162/71, 153/89 03/03/16 - 159/90 03/04/16 - 163/92, 165/89 03/05/16 - 174/60, 170/97  Please advise what patient needs to do?

## 2016-03-05 NOTE — Telephone Encounter (Signed)
Patient aware.  She would like it sent to [facility].  Rx sent, appt scheduled.

## 2016-03-05 NOTE — Telephone Encounter (Signed)
I will send in a low dose of a bp med but I need to know what pharmacy she wants me to send it to. She should f/u in 7-10 days with a record of daily blood pressures and heart rates.--thx

## 2016-03-06 DIAGNOSIS — N3021 Other chronic cystitis with hematuria: Secondary | ICD-10-CM | POA: Diagnosis not present

## 2016-03-10 ENCOUNTER — Encounter: Payer: Self-pay | Admitting: Family Medicine

## 2016-03-12 ENCOUNTER — Ambulatory Visit: Payer: Medicare HMO | Admitting: Family Medicine

## 2016-03-19 ENCOUNTER — Encounter: Payer: Self-pay | Admitting: Family Medicine

## 2016-03-19 ENCOUNTER — Other Ambulatory Visit: Payer: Self-pay | Admitting: Family Medicine

## 2016-03-19 ENCOUNTER — Ambulatory Visit (INDEPENDENT_AMBULATORY_CARE_PROVIDER_SITE_OTHER): Payer: Medicare Other | Admitting: Family Medicine

## 2016-03-19 VITALS — BP 136/95 | HR 71 | Temp 98.3°F | Resp 16 | Ht 60.0 in | Wt 136.5 lb

## 2016-03-19 DIAGNOSIS — I10 Essential (primary) hypertension: Secondary | ICD-10-CM

## 2016-03-19 MED ORDER — AMLODIPINE BESYLATE 10 MG PO TABS: 10.0000 mg | ORAL_TABLET | Freq: Every day | ORAL | 1 refills | Status: DC

## 2016-03-19 NOTE — Progress Notes (Signed)
OFFICE VISIT  03/19/2016   CC:  Chief Complaint  Patient presents with  . Follow-up    HTN, pt is fasting.    HPI:    Patient is a 81 y.o. Caucasian female who presents for f/u HTN.  Up until recently she had not needed medication, but she called recently reporting consistently elevated blood pressures at home so on 03/05/16 I rx'd her amlodipine 5mg  qd.  Says she feels better since getting on amlodipine; more energy. No adverse effects note. BP has trended down slowly since getting on med.  BPs now avg 140/80s, HR 80 avg. Goal for her 130/80.  Past Medical History:  Diagnosis Date  . Asthma   . Chest pain, non-cardiac    Cardiac CT showed no signif obstructive dz, mildly elevated calcium level (Dr. [name]).  . Chronic hypoxemic respiratory failure ([place]) 02/02/2015   2L oxygen 24/7  . COPD (chronic obstructive pulmonary disease) ([place])    spirometry 01/10/09 FEV 0.97(52%), FEV1% 47  . DDD (degenerative disc disease), cervical    1998 MRI C5-6 impingemt (left)  . DDD (degenerative disc disease), lumbar   . Depression with anxiety 04/15/2011  . Diarrheal disease Summer 2017   ? 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).  As of 10/05/15 pt reports GI dx'd her with C diff and rx'd more flagyl and she is also on cholestyramine.  As of 02/2016, pt's sx's resolved completely.  . Diverticulosis of colon    Procto '97 and colonoscopy 2002  . Dysplastic nevus of upper extremity 04/2014   R tricep ([name])  . Family history of adverse reaction to anesthesia    mother died when patient age 44 receiving anesthesia for thyroid surgery  . Fibrocystic breast disease    w/fibroadenoma.  Bx's showed NO atypia (Dr. Margot Chimes).  Mammo neg 2008.  Marland Kitchen GERD (gastroesophageal reflux disease)   . History of double vision    Ophthalmologist, Dr. Herbert Deaner, is further evaluating this with MRI  orbits and  limited brain (myesthenia gravis testing neg 07/2010)  . History of home oxygen therapy    uses 2 liters at hs  . Hypertension    EKG 03/2010 normal  . Hypothyroidism    Hashimoto's, dx'd 1989  . Hypoxemia    O2 86% RA with exertion 01/10/09, 2 liters oxygen with exertion and sleep  . Lesion of right native kidney 2017   found on CT 02/2015.  F/u MRI 03/31/15 showed it to be smaller and less likely of any significance but repeat CT renal protocol in 82mo was recommended by radiology.  This was done 10/26/15 and showed no interval change in the lesion, but b/c the lesion enhances with contrast, radiol suggested urol consultation (b/c papillary renal cell ca could not be excluded).  Pt saw urol 03/06/16--they will follow.  . Osteoarthritis, hip, bilateral    End stage; R THA  . Pulmonary nodule    CT chest 12/10, June 2011.  No nodule on CT 06/2010.  Marland Kitchen Recurrent UTI    likely related to pt's atrophic vaginitis.  Urol started vaginal estrace 03/2016.  Marland Kitchen Shingles 02/2014   R abd/flank  . Spinal stenosis    Symptomatic (neurogenic claudication)  . 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 Upper airway cough syndrome     Past Surgical History:  Procedure Laterality Date  .  ABDOMINAL HYSTERECTOMY  1978   no BSO per pt--nonmalignant reasons  . APPENDECTOMY    . BREAST CYST EXCISION     Last screening mammogram 10/2010 was normal.  . CHOLECYSTECTOMY  2001  . COLONOSCOPY  04/2005; 12/06/2015   2007 NORMAL.  2017 Polyp x 1--path pending as of 12/2015.  Mod sigmoid diverticulosis (Dr. Earlean Shawl).    Marland Kitchen HIP CLOSED REDUCTION Right 12/25/2015   Procedure: CLOSED REDUCTION HIP;  Surgeon: Nicholes Stairs, MD;  Location: [facility];  Service: Orthopedics;  Laterality: Right;  . TONSILLECTOMY    . TOTAL HIP ARTHROPLASTY Right 11/01/2014   Procedure: RIGHT TOTAL HIP ARTHROPLASTY;  Surgeon: Latanya Maudlin, MD;  Location: [facility];  Service: Orthopedics;   Laterality: Right;  . TRANSTHORACIC ECHOCARDIOGRAM  08/2014   EF 60-65%, grade I diast dysfxn  . TUBAL LIGATION  1974    Outpatient Medications Prior to Visit  Medication Sig Dispense Refill  . acetaminophen (TYLENOL) 500 MG tablet Take 500 mg by mouth every 6 (six) hours as needed for mild pain, moderate pain, fever or headache.    . albuterol (PROAIR HFA) 108 (90 BASE) MCG/ACT inhaler Inhale 2 puffs into the lungs every 4 (four) hours as needed. (Patient taking differently: Inhale 2 puffs into the lungs every 4 (four) hours as needed for wheezing or shortness of breath. ) 1 Inhaler 5  . aspirin EC 81 MG tablet Take 81 mg by mouth daily.    . cetirizine (ZYRTEC) 10 MG tablet Take 1 tablet (10 mg total) by mouth daily. (Patient taking differently: Take 10-20 mg by mouth daily as needed for allergies. ) 30 tablet 11  . DALIRESP 500 MCG TABS tablet TAKE 1 TABLET BY MOUTH EVERY DAY 90 tablet 0  . diazepam (VALIUM) 5 MG tablet TAKE 1 TABLET BY MOUTH EVERY MORNING AND 2 TABLETS BY MOUTH EVERY NIGHT AT BEDTIME 90 tablet 5  . Fluticasone-Salmeterol (ADVAIR DISKUS) 250-50 MCG/DOSE AEPB INHALE 1 PUFF INTO THE LUNGS TWICE DAILY 180 each 3  . polyvinyl alcohol-povidone (REFRESH) 1.4-0.6 % ophthalmic solution Place 1 drop into both eyes as needed (for drys eyes).    . Probiotic Product (PROBIOTIC DAILY PO) Take 2 capsules by mouth daily.     Marland Kitchen SPIRIVA HANDIHALER 18 MCG inhalation capsule INHALE 1 CAPSULE VIA HANDIHALER EVERY DAY AT THE SAME TIME 30 capsule 3  . SYNTHROID 88 MCG tablet TAKE 1 TABLET BY MOUTH DAILY 30 tablet 6  . venlafaxine (EFFEXOR) 50 MG tablet TAKE 1 TABLET(50 MG) BY MOUTH TWICE DAILY (Patient taking differently: TAKE 25-50 mg BY MOUTH twice a day) 60 tablet 11  . amLODipine (NORVASC) 5 MG tablet Take 1 tablet (5 mg total) by mouth daily. 30 tablet 1  . ciprofloxacin (CIPRO) 500 MG tablet Take 1 tablet (500 mg total) by mouth 2 (two) times daily. (Patient not taking: Reported on 03/19/2016)  10 tablet 0  . conjugated estrogens (PREMARIN) vaginal cream Place 1 Applicatorful vaginally daily. Place a small amount of estrogen cream on finger and place over urethral area daily (Patient not taking: Reported on 03/19/2016) 42.5 g 0   No facility-administered medications prior to visit.     Allergies  Allergen Reactions  . Losartan Other (See Comments)    hyperkalemia  . Penicillins Hives, Swelling and Rash    Has patient had a PCN reaction causing immediate rash, facial/tongue/throat swelling, SOB or lightheadedness with hypotension: YES Has patient had a PCN reaction causing severe rash involving mucus membranes or skin  necrosis: NO Has patient had a PCN reaction that required hospitalization NO Has patient had a PCN reaction occurring within the last 10 years: NO If all of the above answers are "NO", then may proceed with Cephalosporin use.   . Cefixime [Kdc:Cefixime] Other (See Comments)    unspecified  . Indocin [Indomethacin] Other (See Comments)    Painful tongue and throat  . Sulfa Antibiotics Other (See Comments)    unspecified  . Ace Inhibitors Other (See Comments)    cough  . Statins Other (See Comments)    Myalgias     ROS As per HPI  PE: Blood pressure (!) 136/95, pulse 71, temperature 98.3 F (36.8 C), temperature source Oral, resp. rate 16, height 5' (1.524 m), weight 136 lb 8 oz (61.9 kg), SpO2 97 %. Gen: Alert, well appearing.  Patient is oriented to person, place, time, and situation. AFFECT: pleasant, lucid thought and speech. CV: RRR, no m/r/g.   LUNGS: CTA bilat, nonlabored resps, good aeration in all lung fields. EXT: no clubbing or cyanosis.  1+ bilat LL edema.  LABS:    Chemistry      Component Value Date/Time   NA 141 02/27/2016 1533   K 3.9 02/27/2016 1533   CL 107 02/27/2016 1533   CO2 26 02/27/2016 1533   BUN 16 02/27/2016 1533   CREATININE 0.84 02/27/2016 1533      Component Value Date/Time   CALCIUM 9.6 02/27/2016 1533   ALKPHOS  66 07/01/2015 1730   AST 16 07/01/2015 1730   ALT 9 (L) 07/01/2015 1730   BILITOT 0.7 07/01/2015 1730     Lab Results  Component Value Date   TSH 1.87 02/27/2016   IMPRESSION AND PLAN:  1) Uncontrolled HTN: improved on 5mg  amlodipine (which she has been on in the past). Will increase to 10mg  qd; goal bp 130/80. She'll continue home bp monitoring.    An After Visit Summary was printed and given to the patient.  FOLLOW UP: Return in about 4 weeks (around 04/16/2016) for f/u HTN.  Arrange AWV with Maudie Mercury at [employer].  Signed:  Crissie Sickles, MD           03/19/2016

## 2016-03-19 NOTE — Progress Notes (Signed)
Pre visit review using our clinic review tool, if applicable. No additional management support is needed unless otherwise documented below in the visit note. 

## 2016-03-27 DIAGNOSIS — L299 Pruritus, unspecified: Secondary | ICD-10-CM | POA: Diagnosis not present

## 2016-03-27 DIAGNOSIS — D229 Melanocytic nevi, unspecified: Secondary | ICD-10-CM | POA: Diagnosis not present

## 2016-03-27 DIAGNOSIS — D0471 Carcinoma in situ of skin of right lower limb, including hip: Secondary | ICD-10-CM | POA: Diagnosis not present

## 2016-03-27 DIAGNOSIS — D492 Neoplasm of unspecified behavior of bone, soft tissue, and skin: Secondary | ICD-10-CM | POA: Diagnosis not present

## 2016-04-01 ENCOUNTER — Ambulatory Visit (INDEPENDENT_AMBULATORY_CARE_PROVIDER_SITE_OTHER): Payer: Medicare Other | Admitting: Family Medicine

## 2016-04-01 ENCOUNTER — Encounter: Payer: Self-pay | Admitting: Family Medicine

## 2016-04-01 VITALS — BP 134/75 | HR 81 | Temp 98.7°F | Resp 16 | Ht 60.0 in | Wt 136.5 lb

## 2016-04-01 DIAGNOSIS — I10 Essential (primary) hypertension: Secondary | ICD-10-CM

## 2016-04-01 DIAGNOSIS — J441 Chronic obstructive pulmonary disease with (acute) exacerbation: Secondary | ICD-10-CM

## 2016-04-01 MED ORDER — ALBUTEROL SULFATE (2.5 MG/3ML) 0.083% IN NEBU: 2.5000 mg | INHALATION_SOLUTION | Freq: Four times a day (QID) | RESPIRATORY_TRACT | 5 refills | Status: DC | PRN

## 2016-04-01 MED ORDER — ALBUTEROL SULFATE HFA 108 (90 BASE) MCG/ACT IN AERS: 2.0000 | INHALATION_SPRAY | RESPIRATORY_TRACT | 5 refills | Status: DC | PRN

## 2016-04-01 MED ORDER — PREDNISONE 20 MG PO TABS: ORAL_TABLET | ORAL | 0 refills | Status: DC

## 2016-04-01 MED ORDER — AZITHROMYCIN 250 MG PO TABS: ORAL_TABLET | ORAL | 0 refills | Status: DC

## 2016-04-01 NOTE — Progress Notes (Signed)
Pre visit review using our clinic review tool, if applicable. No additional management support is needed unless otherwise documented below in the visit note. 

## 2016-04-01 NOTE — Patient Instructions (Signed)
Get otc generic robitussin DM OR Mucinex DM and use as directed on the packaging for cough and congestion. Use otc generic saline nasal spray 2-3 times per day to irrigate/moisturize your nasal passages.   

## 2016-04-01 NOTE — Progress Notes (Signed)
OFFICE VISIT  04/01/2016   CC:  Chief Complaint  Patient presents with  . Cough    productive x 3 days, has been taking mucinex DM   HPI:    Patient is a 81 y.o. Caucasian female with COPD who presents for cough. Onset 4 nights ago after walking into her church kitchen.  She doesn't know of any specific thing in the kitchen that may have triggered this.  This cough then resolved that night but upon awakening the next morning she started having increased cough and some thick, green sputum.  Some nasal congestion, PND. Tmax 100.  She started wearing her oxygen 2L the night this started and sats have been in 90s. RA sat in 80s, but with 2L oxygen it remains in low/mid 90s.  Denies any persistent SOB. No CP.  She has had wheezing with walking up steps.  She has some hoarseness.  Pt's baseline oxygenation info is: Patient Saturations on Room Air at Rest = 97% Patient Saturations on Room Air while Ambulating = 88% Patient Saturations on 2 Liters of oxygen while Ambulating = 91%  Apparently pt stopped taking her amlodipine b/c her bp was 123456 systolic and she thought her goal was 140, so she thought her bp was too low.  BP monitoring reviewed today and it shows bp consistently 120s/60s, HR 80s. No lightheadedness or fatigue.  Past Medical History:  Diagnosis Date  . Asthma   . Chest pain, non-cardiac    Cardiac CT showed no signif obstructive dz, mildly elevated calcium level (Dr. [name]).  . Chronic hypoxemic respiratory failure ([place]) 02/02/2015   2L oxygen 24/7  . COPD (chronic obstructive pulmonary disease) ([place])    spirometry 01/10/09 FEV 0.97(52%), FEV1% 47  . DDD (degenerative disc disease), cervical    1998 MRI C5-6 impingemt (left)  . DDD (degenerative disc disease), lumbar   . Depression with anxiety 04/15/2011  . Diarrheal disease Summer 2017   ? 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).  As of 10/05/15 pt reports GI dx'd her with C diff and rx'd more flagyl and she is also on cholestyramine.  As of 02/2016, pt's sx's resolved completely.  . Diverticulosis of colon    Procto '97 and colonoscopy 2002  . Dysplastic nevus of upper extremity 04/2014   R tricep ([name])  . Family history of adverse reaction to anesthesia    mother died when patient age 73 receiving anesthesia for thyroid surgery  . Fibrocystic breast disease    w/fibroadenoma.  Bx's showed NO atypia (Dr. Margot Chimes).  Mammo neg 2008.  Marland Kitchen GERD (gastroesophageal reflux disease)   . History of double vision    Ophthalmologist, Dr. Herbert Deaner, is further evaluating this with MRI  orbits and limited brain (myesthenia gravis testing neg 07/2010)  . History of home oxygen therapy    uses 2 liters at hs  . Hypertension    EKG 03/2010 normal  . Hypothyroidism    Hashimoto's, dx'd 1989  . Hypoxemia    O2 86% RA with exertion 01/10/09, 2 liters oxygen with exertion and sleep  . Lesion of right native kidney 2017   found on CT 02/2015.  F/u MRI 03/31/15 showed it to be smaller and less likely of any significance but repeat CT renal protocol in 29mo was recommended by radiology.  This was done 10/26/15 and showed no interval change in the lesion, but b/c the lesion  enhances with contrast, radiol suggested urol consultation (b/c papillary renal cell ca could not be excluded).  Pt saw urol 03/06/16--they will follow.  . Osteoarthritis, hip, bilateral    End stage; R THA  . Pulmonary nodule    CT chest 12/10, June 2011.  No nodule on CT 06/2010.  Marland Kitchen Recurrent UTI    likely related to pt's atrophic vaginitis.  Urol started vaginal estrace 03/2016.  Marland Kitchen Shingles 02/2014   R abd/flank  . Spinal stenosis    Symptomatic (neurogenic claudication)  . 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 Upper airway cough syndrome     Past Surgical History:  Procedure Laterality  Date  . ABDOMINAL HYSTERECTOMY  1978   no BSO per pt--nonmalignant reasons  . APPENDECTOMY    . BREAST CYST EXCISION     Last screening mammogram 10/2010 was normal.  . CHOLECYSTECTOMY  2001  . COLONOSCOPY  04/2005; 12/06/2015   2007 NORMAL.  2017 Polyp x 1--path pending as of 12/2015.  Mod sigmoid diverticulosis (Dr. Earlean Shawl).    Marland Kitchen HIP CLOSED REDUCTION Right 12/25/2015   Procedure: CLOSED REDUCTION HIP;  Surgeon: Nicholes Stairs, MD;  Location: [facility];  Service: Orthopedics;  Laterality: Right;  . TONSILLECTOMY    . TOTAL HIP ARTHROPLASTY Right 11/01/2014   Procedure: RIGHT TOTAL HIP ARTHROPLASTY;  Surgeon: Latanya Maudlin, MD;  Location: [facility];  Service: Orthopedics;  Laterality: Right;  . TRANSTHORACIC ECHOCARDIOGRAM  08/2014   EF 60-65%, grade I diast dysfxn  . TUBAL LIGATION  1974    Outpatient Medications Prior to Visit  Medication Sig Dispense Refill  . acetaminophen (TYLENOL) 500 MG tablet Take 500 mg by mouth every 6 (six) hours as needed for mild pain, moderate pain, fever or headache.    Marland Kitchen aspirin EC 81 MG tablet Take 81 mg by mouth daily.    . cetirizine (ZYRTEC) 10 MG tablet Take 1 tablet (10 mg total) by mouth daily. (Patient taking differently: Take 10-20 mg by mouth daily as needed for allergies. ) 30 tablet 11  . DALIRESP 500 MCG TABS tablet TAKE 1 TABLET BY MOUTH EVERY DAY 90 tablet 0  . diazepam (VALIUM) 5 MG tablet TAKE 1 TABLET BY MOUTH EVERY MORNING AND 2 TABLETS BY MOUTH EVERY NIGHT AT BEDTIME 90 tablet 5  . Fluticasone-Salmeterol (ADVAIR DISKUS) 250-50 MCG/DOSE AEPB INHALE 1 PUFF INTO THE LUNGS TWICE DAILY 180 each 3  . polyvinyl alcohol-povidone (REFRESH) 1.4-0.6 % ophthalmic solution Place 1 drop into both eyes as needed (for drys eyes).    . Probiotic Product (PROBIOTIC DAILY PO) Take 2 capsules by mouth daily.     Marland Kitchen SPIRIVA HANDIHALER 18 MCG inhalation capsule INHALE 1 CAPSULE VIA HANDIHALER EVERY DAY AT THE SAME TIME 30 capsule 3  . SYNTHROID 88 MCG tablet  TAKE 1 TABLET BY MOUTH DAILY 30 tablet 6  . venlafaxine (EFFEXOR) 50 MG tablet TAKE 1 TABLET(50 MG) BY MOUTH TWICE DAILY (Patient taking differently: TAKE 25-50 mg BY MOUTH twice a day) 60 tablet 11  . albuterol (PROAIR HFA) 108 (90 BASE) MCG/ACT inhaler Inhale 2 puffs into the lungs every 4 (four) hours as needed. 1 Inhaler 5  . amLODipine (NORVASC) 10 MG tablet TAKE 1 TABLET BY MOUTH DAILY (Patient not taking: Reported on 04/01/2016) 90 tablet 1   No facility-administered medications prior to visit.     Allergies  Allergen Reactions  . Losartan Other (See Comments)  hyperkalemia  . Penicillins Hives, Swelling and Rash    Has patient had a PCN reaction causing immediate rash, facial/tongue/throat swelling, SOB or lightheadedness with hypotension: YES Has patient had a PCN reaction causing severe rash involving mucus membranes or skin necrosis: NO Has patient had a PCN reaction that required hospitalization NO Has patient had a PCN reaction occurring within the last 10 years: NO If all of the above answers are "NO", then may proceed with Cephalosporin use.   . Cefixime [Kdc:Cefixime] Other (See Comments)    unspecified  . Indocin [Indomethacin] Other (See Comments)    Painful tongue and throat  . Sulfa Antibiotics Other (See Comments)    unspecified  . Ace Inhibitors Other (See Comments)    cough  . Statins Other (See Comments)    Myalgias     ROS As per HPI  PE: Blood pressure 134/75, pulse 81, temperature 98.7 F (37.1 C), temperature source Oral, resp. rate 16, height 5' (1.524 m), weight 136 lb 8 oz (61.9 kg), SpO2 92 %.At rest without oxygen today her sat was 92%.  With ambulation today, carrying her purse and coat, her oxygen sat was mid 80s briefly, then is recovered to low to mid 90s in about 45 seconds. Gen: Alert, well appearing.  Patient is oriented to person, place, time, and situation. AFFECT: pleasant, lucid thought and speech. ENT: Ears: EACs clear, normal  epithelium.  TMs with good light reflex and landmarks bilaterally.  Eyes: no injection, icteris, swelling, or exudate.  EOMI, PERRLA. Nose: mild clear rhinorrhea noted on non-inflamed mucosa and no turbinate edema.  No injection or focal lesion.  Mouth: lips without lesion/swelling.  Oral mucosa pink and moist.  Dentition intact and without obvious caries or gingival swelling.  Oropharynx without erythema, exudate, or swelling.  CV: RRR, no m/r/g.   LUNGS: CTA bilat, nonlabored resps, good aeration in all lung fields.  On forced exhalation she has decreased aeration on expiration + markedly prolonged expiratory phase.   EXT: no clubbing, cyanosis, or edema.   LABS:  None today  IMPRESSION AND PLAN:  1) COPD exacerbation: start Z-pack.  Start prednisone taper: 40mg  qd x 5d, then 20mg  qd x 5d, then 10mg  qd x 4d.  Albuterol q6h prn: I rx'd her a nebulizer machine today and albuterol neb solution b/c she should have this at home for use if she is having a flare of COPD.  Also refilled proair inhaler. Use oxygen to keep O2 sats at 92% or better.  2) HTN: her bp is great on amlodipine 5mg  qd.  Restart this med and stay at 5mg  dosing.  An After Visit Summary was printed and given to the patient.  FOLLOW UP: Return in about 3 days (around 04/04/2016) for f/u COPD exac.  Signed:  Crissie Sickles, MD           04/01/2016

## 2016-04-04 ENCOUNTER — Encounter: Payer: Self-pay | Admitting: Family Medicine

## 2016-04-04 ENCOUNTER — Ambulatory Visit (INDEPENDENT_AMBULATORY_CARE_PROVIDER_SITE_OTHER): Payer: Medicare Other | Admitting: Family Medicine

## 2016-04-04 VITALS — BP 165/92 | HR 72 | Temp 97.9°F | Resp 16 | Ht 60.0 in | Wt 137.5 lb

## 2016-04-04 DIAGNOSIS — R3915 Urgency of urination: Secondary | ICD-10-CM

## 2016-04-04 DIAGNOSIS — J441 Chronic obstructive pulmonary disease with (acute) exacerbation: Secondary | ICD-10-CM

## 2016-04-04 DIAGNOSIS — N952 Postmenopausal atrophic vaginitis: Secondary | ICD-10-CM | POA: Diagnosis not present

## 2016-04-04 DIAGNOSIS — R300 Dysuria: Secondary | ICD-10-CM | POA: Diagnosis not present

## 2016-04-04 DIAGNOSIS — N3001 Acute cystitis with hematuria: Secondary | ICD-10-CM

## 2016-04-04 DIAGNOSIS — R350 Frequency of micturition: Secondary | ICD-10-CM | POA: Diagnosis not present

## 2016-04-04 LAB — POCT URINALYSIS DIPSTICK
Bilirubin, UA: NEGATIVE
Glucose, UA: NEGATIVE
Ketones, UA: NEGATIVE
Nitrite, UA: NEGATIVE
Protein, UA: 100
Spec Grav, UA: 1.025
Urobilinogen, UA: 0.2
pH, UA: 5.5

## 2016-04-04 MED ORDER — CIPROFLOXACIN HCL 500 MG PO TABS: 500.0000 mg | ORAL_TABLET | Freq: Two times a day (BID) | ORAL | 0 refills | Status: AC

## 2016-04-04 NOTE — Progress Notes (Signed)
Pre visit review using our clinic review tool, if applicable. No additional management support is needed unless otherwise documented below in the visit note. 

## 2016-04-04 NOTE — Progress Notes (Signed)
OFFICE VISIT  04/04/2016   CC:  Chief Complaint  Patient presents with  . Follow-up    COPD exacerbation  . Urinary Tract Infection    symtoms started last night   HPI:    Patient is a 81 y.o. Caucasian female who presents for 3 day f/u COPD exacerbation. She is improving: less cough, more productive cough when she does cough, nebulized albuterol helping. No fevers.  Not much wheezing.  Still wearing oxygen with ambulation at home--trying to use it at rest lately as well.  Started having some pain in perineum area with urination this morning, not the urethral area.  She did have some urinary urgency today as well as some urinary frequency.  Feels pain when sitting intermittently, also notes soreness when wiping after urination and has smudges of blood on toilet tissue.   Past Medical History:  Diagnosis Date  . Asthma   . Chest pain, non-cardiac    Cardiac CT showed no signif obstructive dz, mildly elevated calcium level (Dr. [name]).  . Chronic hypoxemic respiratory failure ([place]) 02/02/2015   2L oxygen 24/7  . COPD (chronic obstructive pulmonary disease) ([place])    spirometry 01/10/09 FEV 0.97(52%), FEV1% 47  . DDD (degenerative disc disease), cervical    1998 MRI C5-6 impingemt (left)  . DDD (degenerative disc disease), lumbar   . Depression with anxiety 04/15/2011  . Diarrheal disease Summer 2017   ? 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).  As of 10/05/15 pt reports GI dx'd her with C diff and rx'd more flagyl and she is also on cholestyramine.  As of 02/2016, pt's sx's resolved completely.  . Diverticulosis of colon    Procto '97 and colonoscopy 2002  . Dysplastic nevus of upper extremity 04/2014   R tricep ([name])  . Family history of adverse reaction to anesthesia    mother died when patient age 82 receiving anesthesia for thyroid surgery  . Fibrocystic breast disease     w/fibroadenoma.  Bx's showed NO atypia (Dr. Margot Chimes).  Mammo neg 2008.  Marland Kitchen GERD (gastroesophageal reflux disease)   . History of double vision    Ophthalmologist, Dr. Herbert Deaner, is further evaluating this with MRI  orbits and limited brain (myesthenia gravis testing neg 07/2010)  . History of home oxygen therapy    uses 2 liters at hs  . Hypertension    EKG 03/2010 normal  . Hypothyroidism    Hashimoto's, dx'd 1989  . Hypoxemia    O2 86% RA with exertion 01/10/09, 2 liters oxygen with exertion and sleep  . Lesion of right native kidney 2017   found on CT 02/2015.  F/u MRI 03/31/15 showed it to be smaller and less likely of any significance but repeat CT renal protocol in 9mo was recommended by radiology.  This was done 10/26/15 and showed no interval change in the lesion, but b/c the lesion enhances with contrast, radiol suggested urol consultation (b/c papillary renal cell ca could not be excluded).  Pt saw urol 03/06/16--they will follow.  . Osteoarthritis, hip, bilateral    End stage; R THA  . Pulmonary nodule    CT chest 12/10, June 2011.  No nodule on CT 06/2010.  Marland Kitchen Recurrent UTI    likely related to pt's atrophic vaginitis.  Urol started vaginal estrace 03/2016.  Marland Kitchen Shingles 02/2014   R abd/flank  . Spinal stenosis    Symptomatic (neurogenic claudication)  .  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 Upper airway cough syndrome     Past Surgical History:  Procedure Laterality Date  . ABDOMINAL HYSTERECTOMY  1978   no BSO per pt--nonmalignant reasons  . APPENDECTOMY    . BREAST CYST EXCISION     Last screening mammogram 10/2010 was normal.  . CHOLECYSTECTOMY  2001  . COLONOSCOPY  04/2005; 12/06/2015   2007 NORMAL.  2017 Polyp x 1--path pending as of 12/2015.  Mod sigmoid diverticulosis (Dr. Earlean Shawl).    Marland Kitchen HIP CLOSED REDUCTION Right 12/25/2015   Procedure: CLOSED REDUCTION HIP;  Surgeon: Nicholes Stairs, MD;  Location: [facility];   Service: Orthopedics;  Laterality: Right;  . TONSILLECTOMY    . TOTAL HIP ARTHROPLASTY Right 11/01/2014   Procedure: RIGHT TOTAL HIP ARTHROPLASTY;  Surgeon: Latanya Maudlin, MD;  Location: [facility];  Service: Orthopedics;  Laterality: Right;  . TRANSTHORACIC ECHOCARDIOGRAM  08/2014   EF 60-65%, grade I diast dysfxn  . TUBAL LIGATION  1974    Outpatient Medications Prior to Visit  Medication Sig Dispense Refill  . acetaminophen (TYLENOL) 500 MG tablet Take 500 mg by mouth every 6 (six) hours as needed for mild pain, moderate pain, fever or headache.    . albuterol (PROAIR HFA) 108 (90 Base) MCG/ACT inhaler Inhale 2 puffs into the lungs every 4 (four) hours as needed. 1 Inhaler 5  . albuterol (PROVENTIL) (2.5 MG/3ML) 0.083% nebulizer solution Take 3 mLs (2.5 mg total) by nebulization every 6 (six) hours as needed for wheezing or shortness of breath. 75 mL 5  . amLODipine (NORVASC) 10 MG tablet TAKE 1 TABLET BY MOUTH DAILY 90 tablet 1  . aspirin EC 81 MG tablet Take 81 mg by mouth daily.    Marland Kitchen azithromycin (ZITHROMAX) 250 MG tablet 2 tabs po qd x 1d, then 1 tab po qd x 4d 6 tablet 0  . cetirizine (ZYRTEC) 10 MG tablet Take 1 tablet (10 mg total) by mouth daily. (Patient taking differently: Take 10-20 mg by mouth daily as needed for allergies. ) 30 tablet 11  . DALIRESP 500 MCG TABS tablet TAKE 1 TABLET BY MOUTH EVERY DAY 90 tablet 0  . diazepam (VALIUM) 5 MG tablet TAKE 1 TABLET BY MOUTH EVERY MORNING AND 2 TABLETS BY MOUTH EVERY NIGHT AT BEDTIME 90 tablet 5  . Fluticasone-Salmeterol (ADVAIR DISKUS) 250-50 MCG/DOSE AEPB INHALE 1 PUFF INTO THE LUNGS TWICE DAILY 180 each 3  . polyvinyl alcohol-povidone (REFRESH) 1.4-0.6 % ophthalmic solution Place 1 drop into both eyes as needed (for drys eyes).    . predniSONE (DELTASONE) 20 MG tablet 2 tabs po qd x 5d, then 1 tab po qd x 5d, then 1/2 tab po qd x 4d 17 tablet 0  . Probiotic Product (PROBIOTIC DAILY PO) Take 2 capsules by mouth daily.     Marland Kitchen SPIRIVA  HANDIHALER 18 MCG inhalation capsule INHALE 1 CAPSULE VIA HANDIHALER EVERY DAY AT THE SAME TIME 30 capsule 3  . SYNTHROID 88 MCG tablet TAKE 1 TABLET BY MOUTH DAILY 30 tablet 6  . venlafaxine (EFFEXOR) 50 MG tablet TAKE 1 TABLET(50 MG) BY MOUTH TWICE DAILY (Patient taking differently: TAKE 25-50 mg BY MOUTH twice a day) 60 tablet 11   No facility-administered medications prior to visit.     Allergies  Allergen Reactions  . Losartan Other (See Comments)    hyperkalemia  . Penicillins Hives, Swelling and Rash  Has patient had a PCN reaction causing immediate rash, facial/tongue/throat swelling, SOB or lightheadedness with hypotension: YES Has patient had a PCN reaction causing severe rash involving mucus membranes or skin necrosis: NO Has patient had a PCN reaction that required hospitalization NO Has patient had a PCN reaction occurring within the last 10 years: NO If all of the above answers are "NO", then may proceed with Cephalosporin use.   . Cefixime [Kdc:Cefixime] Other (See Comments)    unspecified  . Indocin [Indomethacin] Other (See Comments)    Painful tongue and throat  . Sulfa Antibiotics Other (See Comments)    unspecified  . Ace Inhibitors Other (See Comments)    cough  . Statins Other (See Comments)    Myalgias     ROS As per HPI  PE: Blood pressure (!) 165/92, pulse 72, temperature 97.9 F (36.6 C), temperature source Oral, resp. rate 16, height 5' (1.524 m), weight 137 lb 8 oz (62.4 kg), SpO2 94 %. Gen: Alert, well appearing.  Patient is oriented to person, place, time, and situation. AFFECT: pleasant, lucid thought and speech. CV: RRR, no m/r/g.   LUNGS: CTA bilat, nonlabored resps, good aeration in all lung fields.  With a forced expiration she has prolonged exp phase but no wheeze. EXT: no clubbing, cyanosis, or edema.  Chaperoned by Starla Link, CMA. Vagina with noted mucosal atrophy but no bleeding.  Lower aspect of inner labia majora are TTP, R>L. She  has a protruding cervix/uterus with coughing--indicating a cystocele or rectocele, which she says is chronic and has been asymptomatic.  I don't feel any abscess in area of bartholin gland, nor do I even feel the gland itself.   LABS:   CC UA today: large blood, small leukocytes, 100 mg/dl protein, o/w normal.  IMPRESSION AND PLAN:  1) COPD exacerbation: improving.  Continue current course of treatment.  2) UTI: start cipro and send urine for c/s.  3) Vaginal atrophy, symptomatic: restart estrace cream that she already has at home---once daily x 2 weeks and then decrease to 1-2 times per week if feeling improved.  An After Visit Summary was printed and given to the patient.  FOLLOW UP: Return if symptoms worsen or fail to improve.  Signed:  Crissie Sickles, MD           04/04/2016

## 2016-04-05 LAB — URINE CULTURE

## 2016-04-07 ENCOUNTER — Telehealth: Payer: Self-pay | Admitting: Family Medicine

## 2016-04-07 NOTE — Telephone Encounter (Signed)
Noted  

## 2016-04-07 NOTE — Telephone Encounter (Signed)
Patient states she had a scare this weekend, she said when she drank something it was like she couldn't catch her breath, not like she was strangled but just maybe her COPD flared up.   She states that she just wanted me to make note of it.  Her O2 has been around 91-92 all weekend.  Patient states over all she does feel better but she just wanted you to be aware of her scare.

## 2016-04-18 ENCOUNTER — Ambulatory Visit (INDEPENDENT_AMBULATORY_CARE_PROVIDER_SITE_OTHER): Payer: Medicare Other | Admitting: Family Medicine

## 2016-04-18 ENCOUNTER — Encounter: Payer: Self-pay | Admitting: Family Medicine

## 2016-04-18 ENCOUNTER — Ambulatory Visit: Payer: Medicare Other

## 2016-04-18 VITALS — BP 126/70 | HR 71 | Temp 97.8°F | Resp 16 | Ht 60.0 in | Wt 140.5 lb

## 2016-04-18 DIAGNOSIS — I10 Essential (primary) hypertension: Secondary | ICD-10-CM | POA: Diagnosis not present

## 2016-04-18 NOTE — Progress Notes (Signed)
OFFICE VISIT  04/18/2016   CC:  Chief Complaint  Patient presents with  . Follow-up    HTN,    HPI:    Patient is a 81 y.o. Caucasian female who presents for 4 week f/u HTN.  BP: 120-140s/pt doesn't remember.  HR in the 80s Taking  5mg  amlodipine as per my instructions from when she was here for COPD 2-3 weeks ago. Just finished a prednisone taper yesterday.  Past Medical History:  Diagnosis Date  . Asthma   . Chest pain, non-cardiac    Cardiac CT showed no signif obstructive dz, mildly elevated calcium level (Dr. [name]).  . Chronic hypoxemic respiratory failure ([place]) 02/02/2015   2L oxygen 24/7  . COPD (chronic obstructive pulmonary disease) ([place])    spirometry 01/10/09 FEV 0.97(52%), FEV1% 47  . DDD (degenerative disc disease), cervical    1998 MRI C5-6 impingemt (left)  . DDD (degenerative disc disease), lumbar   . Depression with anxiety 04/15/2011  . Diarrheal disease Summer 2017   ? 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).  As of 10/05/15 pt reports GI dx'd her with C diff and rx'd more flagyl and she is also on cholestyramine.  As of 02/2016, pt's sx's resolved completely.  . Diverticulosis of colon    Procto '97 and colonoscopy 2002  . Dysplastic nevus of upper extremity 04/2014   R tricep ([name])  . Family history of adverse reaction to anesthesia    mother died when patient age 25 receiving anesthesia for thyroid surgery  . Fibrocystic breast disease    w/fibroadenoma.  Bx's showed NO atypia (Dr. Margot Chimes).  Mammo neg 2008.  Marland Kitchen GERD (gastroesophageal reflux disease)   . History of double vision    Ophthalmologist, Dr. Herbert Deaner, is further evaluating this with MRI  orbits and limited brain (myesthenia gravis testing neg 07/2010)  . History of home oxygen therapy    uses 2 liters at hs  . Hypertension    EKG 03/2010 normal  . Hypothyroidism    Hashimoto's, dx'd  1989  . Hypoxemia    O2 86% RA with exertion 01/10/09, 2 liters oxygen with exertion and sleep  . Lesion of right native kidney 2017   found on CT 02/2015.  F/u MRI 03/31/15 showed it to be smaller and less likely of any significance but repeat CT renal protocol in 11mo was recommended by radiology.  This was done 10/26/15 and showed no interval change in the lesion, but b/c the lesion enhances with contrast, radiol suggested urol consultation (b/c papillary renal cell ca could not be excluded).  Pt saw urol 03/06/16--they will follow.  . Osteoarthritis, hip, bilateral    End stage; R THA  . Pulmonary nodule    CT chest 12/10, June 2011.  No nodule on CT 06/2010.  Marland Kitchen Recurrent UTI    likely related to pt's atrophic vaginitis.  Urol started vaginal estrace 03/2016.  Marland Kitchen Shingles 02/2014   R abd/flank  . Spinal stenosis    Symptomatic (neurogenic claudication)  . 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 Upper airway cough syndrome     Past Surgical History:  Procedure Laterality Date  . ABDOMINAL HYSTERECTOMY  1978   no BSO per pt--nonmalignant reasons  . APPENDECTOMY    . BREAST CYST EXCISION     Last screening mammogram 10/2010 was normal.  .  CHOLECYSTECTOMY  2001  . COLONOSCOPY  04/2005; 12/06/2015   2007 NORMAL.  2017 Polyp x 1--path pending as of 12/2015.  Mod sigmoid diverticulosis (Dr. Earlean Shawl).    Marland Kitchen HIP CLOSED REDUCTION Right 12/25/2015   Procedure: CLOSED REDUCTION HIP;  Surgeon: Nicholes Stairs, MD;  Location: [facility];  Service: Orthopedics;  Laterality: Right;  . TONSILLECTOMY    . TOTAL HIP ARTHROPLASTY Right 11/01/2014   Procedure: RIGHT TOTAL HIP ARTHROPLASTY;  Surgeon: Latanya Maudlin, MD;  Location: [facility];  Service: Orthopedics;  Laterality: Right;  . TRANSTHORACIC ECHOCARDIOGRAM  08/2014   EF 60-65%, grade I diast dysfxn  . TUBAL LIGATION  1974    Outpatient Medications Prior to Visit  Medication Sig Dispense Refill  .  acetaminophen (TYLENOL) 500 MG tablet Take 500 mg by mouth every 6 (six) hours as needed for mild pain, moderate pain, fever or headache.    . albuterol (PROAIR HFA) 108 (90 Base) MCG/ACT inhaler Inhale 2 puffs into the lungs every 4 (four) hours as needed. 1 Inhaler 5  . albuterol (PROVENTIL) (2.5 MG/3ML) 0.083% nebulizer solution Take 3 mLs (2.5 mg total) by nebulization every 6 (six) hours as needed for wheezing or shortness of breath. 75 mL 5  . amLODipine (NORVASC) 10 MG tablet TAKE 1 TABLET BY MOUTH DAILY 90 tablet 1  . aspirin EC 81 MG tablet Take 81 mg by mouth daily.    . cetirizine (ZYRTEC) 10 MG tablet Take 1 tablet (10 mg total) by mouth daily. (Patient taking differently: Take 10-20 mg by mouth daily as needed for allergies. ) 30 tablet 11  . DALIRESP 500 MCG TABS tablet TAKE 1 TABLET BY MOUTH EVERY DAY 90 tablet 0  . diazepam (VALIUM) 5 MG tablet TAKE 1 TABLET BY MOUTH EVERY MORNING AND 2 TABLETS BY MOUTH EVERY NIGHT AT BEDTIME 90 tablet 5  . Fluticasone-Salmeterol (ADVAIR DISKUS) 250-50 MCG/DOSE AEPB INHALE 1 PUFF INTO THE LUNGS TWICE DAILY 180 each 3  . polyvinyl alcohol-povidone (REFRESH) 1.4-0.6 % ophthalmic solution Place 1 drop into both eyes as needed (for drys eyes).    . Probiotic Product (PROBIOTIC DAILY PO) Take 2 capsules by mouth daily.     Marland Kitchen SPIRIVA HANDIHALER 18 MCG inhalation capsule INHALE 1 CAPSULE VIA HANDIHALER EVERY DAY AT THE SAME TIME 30 capsule 3  . SYNTHROID 88 MCG tablet TAKE 1 TABLET BY MOUTH DAILY 30 tablet 6  . venlafaxine (EFFEXOR) 50 MG tablet TAKE 1 TABLET(50 MG) BY MOUTH TWICE DAILY (Patient taking differently: TAKE 25-50 mg BY MOUTH twice a day) 60 tablet 11  . azithromycin (ZITHROMAX) 250 MG tablet 2 tabs po qd x 1d, then 1 tab po qd x 4d (Patient not taking: Reported on 04/18/2016) 6 tablet 0  . predniSONE (DELTASONE) 20 MG tablet 2 tabs po qd x 5d, then 1 tab po qd x 5d, then 1/2 tab po qd x 4d (Patient not taking: Reported on 04/18/2016) 17 tablet 0    No facility-administered medications prior to visit.     Allergies  Allergen Reactions  . Losartan Other (See Comments)    hyperkalemia  . Penicillins Hives, Swelling and Rash    Has patient had a PCN reaction causing immediate rash, facial/tongue/throat swelling, SOB or lightheadedness with hypotension: YES Has patient had a PCN reaction causing severe rash involving mucus membranes or skin necrosis: NO Has patient had a PCN reaction that required hospitalization NO Has patient had a PCN reaction occurring within the last 10 years: NO  If all of the above answers are "NO", then may proceed with Cephalosporin use.   . Cefixime [Kdc:Cefixime] Other (See Comments)    unspecified  . Indocin [Indomethacin] Other (See Comments)    Painful tongue and throat  . Sulfa Antibiotics Other (See Comments)    unspecified  . Ace Inhibitors Other (See Comments)    cough  . Statins Other (See Comments)    Myalgias     ROS As per HPI  PE: Blood pressure (!) 147/97, pulse 71, temperature 97.8 F (36.6 C), temperature source Oral, resp. rate 16, height 5' (1.524 m), weight 140 lb 8 oz (63.7 kg), SpO2 92 %. BP recheck today 126/70. Gen: Alert, well appearing.  Patient is oriented to person, place, time, and situation. AFFECT: pleasant, lucid thought and speech. CV: RRR, P 75 by me today, no m/r/g.   LUNGS: CTA bilat, nonlabored resps, good aeration in all lung fields.   LABS:    Chemistry      Component Value Date/Time   NA 141 02/27/2016 1533   K 3.9 02/27/2016 1533   CL 107 02/27/2016 1533   CO2 26 02/27/2016 1533   BUN 16 02/27/2016 1533   CREATININE 0.84 02/27/2016 1533      Component Value Date/Time   CALCIUM 9.6 02/27/2016 1533   ALKPHOS 66 07/01/2015 1730   AST 16 07/01/2015 1730   ALT 9 (L) 07/01/2015 1730   BILITOT 0.7 07/01/2015 1730      IMPRESSION AND PLAN:  HTN; control is adequate.  Will have her continue to keep a close eye on bp at home and if consistently  getting bp >140/90 then she will increase her amlodipine to 10mg  qd and give [place] a call.  An After Visit Summary was printed and given to the patient.  FOLLOW UP: No Follow-up on file.  Signed:  Crissie Sickles, MD           04/18/2016

## 2016-04-18 NOTE — Progress Notes (Signed)
Pre visit review using our clinic review tool, if applicable. No additional management support is needed unless otherwise documented below in the visit note. 

## 2016-04-21 DIAGNOSIS — H35363 Drusen (degenerative) of macula, bilateral: Secondary | ICD-10-CM | POA: Diagnosis not present

## 2016-04-21 DIAGNOSIS — H35373 Puckering of macula, bilateral: Secondary | ICD-10-CM | POA: Diagnosis not present

## 2016-04-21 DIAGNOSIS — Z961 Presence of intraocular lens: Secondary | ICD-10-CM | POA: Diagnosis not present

## 2016-04-21 DIAGNOSIS — H26492 Other secondary cataract, left eye: Secondary | ICD-10-CM | POA: Diagnosis not present

## 2016-04-24 DIAGNOSIS — D0471 Carcinoma in situ of skin of right lower limb, including hip: Secondary | ICD-10-CM | POA: Diagnosis not present

## 2016-04-30 ENCOUNTER — Other Ambulatory Visit: Payer: Self-pay | Admitting: Family Medicine

## 2016-05-01 ENCOUNTER — Other Ambulatory Visit: Payer: Self-pay | Admitting: Pulmonary Disease

## 2016-05-06 DIAGNOSIS — H26492 Other secondary cataract, left eye: Secondary | ICD-10-CM | POA: Diagnosis not present

## 2016-05-09 DIAGNOSIS — J449 Chronic obstructive pulmonary disease, unspecified: Secondary | ICD-10-CM | POA: Diagnosis not present

## 2016-05-09 DIAGNOSIS — G4733 Obstructive sleep apnea (adult) (pediatric): Secondary | ICD-10-CM | POA: Diagnosis not present

## 2016-05-09 DIAGNOSIS — R0602 Shortness of breath: Secondary | ICD-10-CM | POA: Diagnosis not present

## 2016-05-10 DIAGNOSIS — G4733 Obstructive sleep apnea (adult) (pediatric): Secondary | ICD-10-CM | POA: Diagnosis not present

## 2016-05-10 DIAGNOSIS — J449 Chronic obstructive pulmonary disease, unspecified: Secondary | ICD-10-CM | POA: Diagnosis not present

## 2016-05-10 DIAGNOSIS — R0602 Shortness of breath: Secondary | ICD-10-CM | POA: Diagnosis not present

## 2016-05-16 ENCOUNTER — Encounter: Payer: Self-pay | Admitting: Family Medicine

## 2016-05-16 ENCOUNTER — Ambulatory Visit (INDEPENDENT_AMBULATORY_CARE_PROVIDER_SITE_OTHER): Payer: Medicare Other | Admitting: Family Medicine

## 2016-05-16 VITALS — BP 122/78 | HR 77 | Temp 97.6°F | Resp 16 | Ht 60.0 in | Wt 142.0 lb

## 2016-05-16 DIAGNOSIS — Z792 Long term (current) use of antibiotics: Secondary | ICD-10-CM

## 2016-05-16 DIAGNOSIS — I10 Essential (primary) hypertension: Secondary | ICD-10-CM

## 2016-05-16 DIAGNOSIS — IMO0002 Reserved for concepts with insufficient information to code with codable children: Secondary | ICD-10-CM

## 2016-05-16 MED ORDER — CLINDAMYCIN HCL 300 MG PO CAPS: ORAL_CAPSULE | ORAL | 1 refills | Status: DC

## 2016-05-16 NOTE — Progress Notes (Signed)
Pre visit review using our clinic review tool, if applicable. No additional management support is needed unless otherwise documented below in the visit note. 

## 2016-05-16 NOTE — Progress Notes (Signed)
OFFICE VISIT  05/16/2016   CC:  Chief Complaint  Patient presents with  . Follow-up    HTN, pt is not fasting. Did not take the BP medication this morning.    HPI:    Patient is a 81 y.o. Caucasian female who presents for 4 week f/u HTN. Last visit bp was well controlled on 5 mg amlodipine.  BP: home monitoring shows avg systolic 696V, diastolic avg 89F, HR avg low 80s. Eats high sodium BF each morning.  Takes 5 mg amlod qd, adds another 5 mg tab if bp >145/80---she has had to add another 5mg  tab about 1/3 of the days.. Gets some LE swelling sometimes, better after propping legs up.  She did not take her bp med today.  She has to get some teeth pulled in near future.  Has prosthetic hip.  Pt asks about antibiotic prophylaxis.  ROS: no CP, no recent SOB issues, no HAs, no dizziness, no palpitations.  +intermittent mild fatigue.  Past Medical History:  Diagnosis Date  . Asthma   . Chest pain, non-cardiac    Cardiac CT showed no signif obstructive dz, mildly elevated calcium level (Dr. [name]).  . Chronic hypoxemic respiratory failure ([place]) 02/02/2015   2L oxygen 24/7  . COPD (chronic obstructive pulmonary disease) ([place])    spirometry 01/10/09 FEV 0.97(52%), FEV1% 47  . DDD (degenerative disc disease), cervical    1998 MRI C5-6 impingemt (left)  . DDD (degenerative disc disease), lumbar   . Depression with anxiety 04/15/2011  . Diarrheal disease Summer 2017   ? 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).  As of 10/05/15 pt reports GI dx'd her with C diff and rx'd more flagyl and she is also on cholestyramine.  As of 02/2016, pt's sx's resolved completely.  . Diverticulosis of colon    Procto '97 and colonoscopy 2002  . Dysplastic nevus of upper extremity 04/2014   R tricep ([name])  . Family history of adverse reaction to anesthesia    mother died when patient age 61  receiving anesthesia for thyroid surgery  . Fibrocystic breast disease    w/fibroadenoma.  Bx's showed NO atypia (Dr. Margot Chimes).  Mammo neg 2008.  Marland Kitchen GERD (gastroesophageal reflux disease)   . History of double vision    Ophthalmologist, Dr. Herbert Deaner, is further evaluating this with MRI  orbits and limited brain (myesthenia gravis testing neg 07/2010)  . History of home oxygen therapy    uses 2 liters at hs  . Hypertension    EKG 03/2010 normal  . Hypothyroidism    Hashimoto's, dx'd 1989  . Hypoxemia    O2 86% RA with exertion 01/10/09, 2 liters oxygen with exertion and sleep  . Lesion of right native kidney 2017   found on CT 02/2015.  F/u MRI 03/31/15 showed it to be smaller and less likely of any significance but repeat CT renal protocol in 52mo was recommended by radiology.  This was done 10/26/15 and showed no interval change in the lesion, but b/c the lesion enhances with contrast, radiol suggested urol consultation (b/c papillary renal cell ca could not be excluded).  Pt saw urol 03/06/16--they will follow.  . Osteoarthritis, hip, bilateral    End stage; R THA  . Pulmonary nodule    CT chest 12/10, June 2011.  No nodule on CT 06/2010.  Marland Kitchen Recurrent UTI    likely related to pt's atrophic vaginitis.  Urol started vaginal estrace 03/2016.  Marland Kitchen Shingles 02/2014   R abd/flank  . Spinal stenosis    Symptomatic (neurogenic claudication)  . 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 Upper airway cough syndrome     Past Surgical History:  Procedure Laterality Date  . ABDOMINAL HYSTERECTOMY  1978   no BSO per pt--nonmalignant reasons  . APPENDECTOMY    . BREAST CYST EXCISION     Last screening mammogram 10/2010 was normal.  . CHOLECYSTECTOMY  2001  . COLONOSCOPY  04/2005; 12/06/2015   2007 NORMAL.  2017 Polyp x 1--path pending as of 12/2015.  Mod sigmoid diverticulosis (Dr. Earlean Shawl).    Marland Kitchen HIP CLOSED REDUCTION Right 12/25/2015   Procedure: CLOSED  REDUCTION HIP;  Surgeon: Nicholes Stairs, MD;  Location: [facility];  Service: Orthopedics;  Laterality: Right;  . TONSILLECTOMY    . TOTAL HIP ARTHROPLASTY Right 11/01/2014   Procedure: RIGHT TOTAL HIP ARTHROPLASTY;  Surgeon: Latanya Maudlin, MD;  Location: [facility];  Service: Orthopedics;  Laterality: Right;  . TRANSTHORACIC ECHOCARDIOGRAM  08/2014   EF 60-65%, grade I diast dysfxn  . TUBAL LIGATION  1974    Outpatient Medications Prior to Visit  Medication Sig Dispense Refill  . acetaminophen (TYLENOL) 500 MG tablet Take 500 mg by mouth every 6 (six) hours as needed for mild pain, moderate pain, fever or headache.    . albuterol (PROAIR HFA) 108 (90 Base) MCG/ACT inhaler Inhale 2 puffs into the lungs every 4 (four) hours as needed. 1 Inhaler 5  . albuterol (PROVENTIL) (2.5 MG/3ML) 0.083% nebulizer solution Take 3 mLs (2.5 mg total) by nebulization every 6 (six) hours as needed for wheezing or shortness of breath. 75 mL 5  . aspirin EC 81 MG tablet Take 81 mg by mouth daily.    . cetirizine (ZYRTEC) 10 MG tablet Take 1 tablet (10 mg total) by mouth daily. (Patient taking differently: Take 10-20 mg by mouth daily as needed for allergies. ) 30 tablet 11  . DALIRESP 500 MCG TABS tablet TAKE 1 TABLET BY MOUTH EVERY DAY 90 tablet 0  . diazepam (VALIUM) 5 MG tablet TAKE 1 TABLET BY MOUTH EVERY MORNING AND 2 TABLETS BY MOUTH EVERY NIGHT AT BEDTIME 90 tablet 5  . Fluticasone-Salmeterol (ADVAIR DISKUS) 250-50 MCG/DOSE AEPB INHALE 1 PUFF INTO THE LUNGS TWICE DAILY 180 each 3  . polyvinyl alcohol-povidone (REFRESH) 1.4-0.6 % ophthalmic solution Place 1 drop into both eyes as needed (for drys eyes).    . Probiotic Product (PROBIOTIC DAILY PO) Take 2 capsules by mouth daily.     Marland Kitchen SPIRIVA HANDIHALER 18 MCG inhalation capsule INHALE 1 CAPSULE VIA HANDIHALER EVERY DAY AT THE SAME TIME 30 capsule 3  . SYNTHROID 88 MCG tablet TAKE 1 TABLET BY MOUTH DAILY 30 tablet 3  . venlafaxine (EFFEXOR) 50 MG tablet TAKE 1  TABLET(50 MG) BY MOUTH TWICE DAILY (Patient taking differently: TAKE 25-50 mg BY MOUTH twice a day) 60 tablet 11  . amLODipine (NORVASC) 10 MG tablet TAKE 1 TABLET BY MOUTH DAILY 90 tablet 1   No facility-administered medications prior to visit.     Allergies  Allergen Reactions  . Losartan Other (See Comments)    hyperkalemia  . Penicillins Hives, Swelling and Rash    Has patient had a PCN reaction causing immediate rash, facial/tongue/throat swelling, SOB or lightheadedness with hypotension: YES Has patient had a PCN reaction causing severe rash involving mucus  membranes or skin necrosis: NO Has patient had a PCN reaction that required hospitalization NO Has patient had a PCN reaction occurring within the last 10 years: NO If all of the above answers are "NO", then may proceed with Cephalosporin use.   . Cefixime [Kdc:Cefixime] Other (See Comments)    unspecified  . Indocin [Indomethacin] Other (See Comments)    Painful tongue and throat  . Sulfa Antibiotics Other (See Comments)    unspecified  . Ace Inhibitors Other (See Comments)    cough  . Statins Other (See Comments)    Myalgias     ROS As per HPI  PE: Blood pressure 122/78, pulse 77, temperature 97.6 F (36.4 C), temperature source Oral, resp. rate 16, height 5' (1.524 m), weight 142 lb (64.4 kg), SpO2 97 %. Gen: Alert, well appearing.  Patient is oriented to person, place, time, and situation. AFFECT: pleasant, lucid thought and speech. CV: RRR, no m/r/g.   LUNGS: CTA bilat, nonlabored resps, good aeration in all lung fields. EXT: no clubbing, cyanosis, or edema.   LABS:   Lab Results  Component Value Date   TSH 1.87 02/27/2016      Chemistry      Component Value Date/Time   NA 141 02/27/2016 1533   K 3.9 02/27/2016 1533   CL 107 02/27/2016 1533   CO2 26 02/27/2016 1533   BUN 16 02/27/2016 1533   CREATININE 0.84 02/27/2016 1533      Component Value Date/Time   CALCIUM 9.6 02/27/2016 1533   ALKPHOS  66 07/01/2015 1730   AST 16 07/01/2015 1730   ALT 9 (L) 07/01/2015 1730   BILITOT 0.7 07/01/2015 1730       IMPRESSION AND PLAN:  1) HTN; well controlled with taking 10mg  (two 5 mg tabs) daily. The current medical regimen is effective;  continue present plan and medications.  2) Dental procedure/teeth extraction coming up in near future: due to having prosthetic hip, will eRx prophylactic clindamycin 600mg  po 1 hour prior to procedure (penicillin allergic).  An After Visit Summary was printed and given to the patient.  FOLLOW UP: Return in about 6 months (around 11/15/2016) for routine chronic illness f/u.  Signed:  Crissie Sickles, MD           05/16/2016

## 2016-05-19 DIAGNOSIS — Z1231 Encounter for screening mammogram for malignant neoplasm of breast: Secondary | ICD-10-CM | POA: Diagnosis not present

## 2016-05-19 DIAGNOSIS — Z853 Personal history of malignant neoplasm of breast: Secondary | ICD-10-CM | POA: Diagnosis not present

## 2016-05-19 LAB — HM MAMMOGRAPHY

## 2016-05-26 ENCOUNTER — Ambulatory Visit: Payer: Medicare Other | Admitting: Pulmonary Disease

## 2016-05-26 ENCOUNTER — Ambulatory Visit: Payer: Medicare Other | Admitting: Adult Health

## 2016-05-29 ENCOUNTER — Telehealth: Payer: Self-pay | Admitting: Family Medicine

## 2016-05-29 NOTE — Telephone Encounter (Signed)
These bp averages are great.  She should keep taking her medication as long as she is not feeling any significant dizziness upon standing up.-thx

## 2016-05-29 NOTE — Telephone Encounter (Signed)
Pt advised and voiced understanding.   

## 2016-05-29 NOTE — Telephone Encounter (Signed)
Patient calling to report BP averages.  Her lowest reading was 109/62 - she's worried they are too low.  Patient 10 mg amlodipine daily. 4.16.18 147/90 4.18.18 133/76 4.22.18 130/76 4.24.18 120/74 4.26.18 113/61 Please contact patient, she states she has not taken her medication today, she was worried her BP was too low to take it.  Please advise.

## 2016-05-29 NOTE — Telephone Encounter (Signed)
Please advise. Thanks.  

## 2016-06-04 ENCOUNTER — Encounter: Payer: Self-pay | Admitting: Family Medicine

## 2016-06-05 ENCOUNTER — Other Ambulatory Visit: Payer: Self-pay | Admitting: Pulmonary Disease

## 2016-06-05 ENCOUNTER — Telehealth: Payer: Self-pay | Admitting: Family Medicine

## 2016-06-05 NOTE — Telephone Encounter (Signed)
Please advise. Thanks.  

## 2016-06-05 NOTE — Telephone Encounter (Signed)
Pt advised and voiced understanding.  Apt made for 06/06/16 at  3:15pm.

## 2016-06-05 NOTE — Telephone Encounter (Signed)
Needs ov thx 

## 2016-06-05 NOTE — Telephone Encounter (Signed)
Patient legs and feet are swelling. She has been propping her feet up when she sits down. It doesn't seem like that helps. Please call her

## 2016-06-06 ENCOUNTER — Encounter: Payer: Self-pay | Admitting: Family Medicine

## 2016-06-06 ENCOUNTER — Ambulatory Visit (INDEPENDENT_AMBULATORY_CARE_PROVIDER_SITE_OTHER): Payer: Medicare Other | Admitting: Family Medicine

## 2016-06-06 VITALS — BP 118/72 | HR 73 | Temp 97.2°F | Resp 16 | Wt 144.2 lb

## 2016-06-06 DIAGNOSIS — R600 Localized edema: Secondary | ICD-10-CM

## 2016-06-06 DIAGNOSIS — T887XXA Unspecified adverse effect of drug or medicament, initial encounter: Secondary | ICD-10-CM | POA: Diagnosis not present

## 2016-06-06 DIAGNOSIS — I10 Essential (primary) hypertension: Secondary | ICD-10-CM

## 2016-06-06 NOTE — Progress Notes (Signed)
OFFICE VISIT  06/06/2016   CC:  Chief Complaint  Patient presents with  . Leg Swelling    Bilateral lower extremities   HPI:    Patient is a 81 y.o. Caucasian female who presents for swelling in lower legs. Onset about 2-3 weeks ago when we increased her amlodipine from 5mg  qd to 10mg  qd. Swell during daytime when up and around, goes down to normal in morning.  No swelling in any other parts of her body.  No SOB, no cough, no CP. Right leg a bit more swollen than left.  Minimal pain in calves, has had to take tylenol once.  Home bp monitoring: 120/70s, HR 80s.  Goes to urology tomorrow to get recheck on R kidney lesion.   Past Medical History:  Diagnosis Date  . Asthma   . Chest pain, non-cardiac    Cardiac CT showed no signif obstructive dz, mildly elevated calcium level (Dr. [name]).  . Chronic hypoxemic respiratory failure ([place]) 02/02/2015   2L oxygen 24/7  . COPD (chronic obstructive pulmonary disease) ([place])    spirometry 01/10/09 FEV 0.97(52%), FEV1% 47  . DDD (degenerative disc disease), cervical    1998 MRI C5-6 impingemt (left)  . DDD (degenerative disc disease), lumbar   . Depression with anxiety 04/15/2011  . Diarrheal disease Summer 2017   ? 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).  As of 10/05/15 pt reports GI dx'd her with C diff and rx'd more flagyl and she is also on cholestyramine.  As of 02/2016, pt's sx's resolved completely.  . Diverticulosis of colon    Procto '97 and colonoscopy 2002  . Dysplastic nevus of upper extremity 04/2014   R tricep ([name])  . Family history of adverse reaction to anesthesia    mother died when patient age 41 receiving anesthesia for thyroid surgery  . Fibrocystic breast disease    w/fibroadenoma.  Bx's showed NO atypia (Dr. Margot Chimes).  Mammo neg 2008.  Marland Kitchen GERD (gastroesophageal reflux disease)   . History of double vision     Ophthalmologist, Dr. Herbert Deaner, is further evaluating this with MRI  orbits and limited brain (myesthenia gravis testing neg 07/2010)  . History of home oxygen therapy    uses 2 liters at hs  . Hypertension    EKG 03/2010 normal  . Hypothyroidism    Hashimoto's, dx'd 1989  . Hypoxemia    O2 86% RA with exertion 01/10/09, 2 liters oxygen with exertion and sleep  . Lesion of right native kidney 2017   found on CT 02/2015.  F/u MRI 03/31/15 showed it to be smaller and less likely of any significance but repeat CT renal protocol in 56mo was recommended by radiology.  This was done 10/26/15 and showed no interval change in the lesion, but b/c the lesion enhances with contrast, radiol suggested urol consultation (b/c papillary renal cell ca could not be excluded).  Pt saw urol 03/06/16--they will follow.  . Osteoarthritis, hip, bilateral    End stage; R THA  . Pulmonary nodule    CT chest 12/10, June 2011.  No nodule on CT 06/2010.  Marland Kitchen Recurrent UTI    likely related to pt's atrophic vaginitis.  Urol started vaginal estrace 03/2016.  Marland Kitchen Shingles 02/2014   R abd/flank  . Spinal stenosis    Symptomatic (neurogenic claudication)  . 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 Upper airway cough syndrome     Past Surgical History:  Procedure Laterality Date  . ABDOMINAL HYSTERECTOMY  1978   no BSO per pt--nonmalignant reasons  . APPENDECTOMY    . BREAST CYST EXCISION     Last screening mammogram 10/2010 was normal.  . CHOLECYSTECTOMY  2001  . COLONOSCOPY  04/2005; 12/06/2015   2007 NORMAL.  2017 Polyp x 1--path pending as of 12/2015.  Mod sigmoid diverticulosis (Dr. Earlean Shawl).    Marland Kitchen HIP CLOSED REDUCTION Right 12/25/2015   Procedure: CLOSED REDUCTION HIP;  Surgeon: Nicholes Stairs, MD;  Location: [facility];  Service: Orthopedics;  Laterality: Right;  . TONSILLECTOMY    . TOTAL HIP ARTHROPLASTY Right 11/01/2014   Procedure: RIGHT TOTAL HIP ARTHROPLASTY;   Surgeon: Latanya Maudlin, MD;  Location: [facility];  Service: Orthopedics;  Laterality: Right;  . TRANSTHORACIC ECHOCARDIOGRAM  08/2014   EF 60-65%, grade I diast dysfxn  . TUBAL LIGATION  1974    Outpatient Medications Prior to Visit  Medication Sig Dispense Refill  . acetaminophen (TYLENOL) 500 MG tablet Take 500 mg by mouth every 6 (six) hours as needed for mild pain, moderate pain, fever or headache.    . albuterol (PROAIR HFA) 108 (90 Base) MCG/ACT inhaler Inhale 2 puffs into the lungs every 4 (four) hours as needed. 1 Inhaler 5  . albuterol (PROVENTIL) (2.5 MG/3ML) 0.083% nebulizer solution Take 3 mLs (2.5 mg total) by nebulization every 6 (six) hours as needed for wheezing or shortness of breath. 75 mL 5  . aspirin EC 81 MG tablet Take 81 mg by mouth daily.    . cetirizine (ZYRTEC) 10 MG tablet Take 1 tablet (10 mg total) by mouth daily. (Patient taking differently: Take 10-20 mg by mouth daily as needed for allergies. ) 30 tablet 11  . clindamycin (CLEOCIN) 300 MG capsule 2 tabs po 1 hour prior to dental procedure 2 capsule 1  . DALIRESP 500 MCG TABS tablet TAKE 1 TABLET BY MOUTH EVERY DAY 90 tablet 0  . diazepam (VALIUM) 5 MG tablet TAKE 1 TABLET BY MOUTH EVERY MORNING AND 2 TABLETS BY MOUTH EVERY NIGHT AT BEDTIME 90 tablet 5  . Fluticasone-Salmeterol (ADVAIR DISKUS) 250-50 MCG/DOSE AEPB INHALE 1 PUFF INTO THE LUNGS TWICE DAILY 180 each 3  . polyvinyl alcohol-povidone (REFRESH) 1.4-0.6 % ophthalmic solution Place 1 drop into both eyes as needed (for drys eyes).    . Probiotic Product (PROBIOTIC DAILY PO) Take 2 capsules by mouth daily.     Marland Kitchen SPIRIVA HANDIHALER 18 MCG inhalation capsule INHALE 1 CAPSULE VIA HANDIHALER EVERY DAY AT THE SAME TIME 30 capsule 3  . SPIRIVA HANDIHALER 18 MCG inhalation capsule INHALE 1 CAPSULE VIA HANDIHALER EVERY DAY AT THE SAME TIME 30 capsule 0  . SYNTHROID 88 MCG tablet TAKE 1 TABLET BY MOUTH DAILY 30 tablet 3  . venlafaxine (EFFEXOR) 50 MG tablet TAKE 1  TABLET(50 MG) BY MOUTH TWICE DAILY (Patient taking differently: TAKE 25-50 mg BY MOUTH twice a day) 60 tablet 11   No facility-administered medications prior to visit.     Allergies  Allergen Reactions  . Losartan Other (See Comments)    hyperkalemia  . Penicillins Hives, Swelling and Rash    Has patient had a PCN reaction causing immediate rash, facial/tongue/throat swelling, SOB or lightheadedness with hypotension: YES Has patient had a PCN reaction causing severe rash involving mucus membranes or skin necrosis: NO Has patient had a PCN reaction that required hospitalization  NO Has patient had a PCN reaction occurring within the last 10 years: NO If all of the above answers are "NO", then may proceed with Cephalosporin use.   . Cefixime [Kdc:Cefixime] Other (See Comments)    unspecified  . Indocin [Indomethacin] Other (See Comments)    Painful tongue and throat  . Sulfa Antibiotics Other (See Comments)    unspecified  . Ace Inhibitors Other (See Comments)    cough  . Statins Other (See Comments)    Myalgias     ROS As per HPI  PE: Blood pressure 118/72, pulse 73, temperature 97.2 F (36.2 C), temperature source Oral, resp. rate 16, weight 144 lb 4 oz (65.4 kg), SpO2 93 %.Room air. Gen: Alert, well appearing.  Patient is oriented to person, place, time, and situation. AFFECT: pleasant, lucid thought and speech. Lower legs: 2+ pitting edema in both ankles.  Anterior pretibial areas down into ankles has some sparse freckling-type hemosiderin changes.    LABS:    Chemistry      Component Value Date/Time   NA 141 02/27/2016 1533   K 3.9 02/27/2016 1533   CL 107 02/27/2016 1533   CO2 26 02/27/2016 1533   BUN 16 02/27/2016 1533   CREATININE 0.84 02/27/2016 1533      Component Value Date/Time   CALCIUM 9.6 02/27/2016 1533   ALKPHOS 66 07/01/2015 1730   AST 16 07/01/2015 1730   ALT 9 (L) 07/01/2015 1730   BILITOT 0.7 07/01/2015 1730       IMPRESSION AND  PLAN:  1) Bilateral LE edema; suspect she has a chronic amount of very mild LE venous insufficiency edema, recently worsened by our increase in her amlodipine from 5mg  to 10 mg daily. Decrease amlodipine to 5mg  qd and continue bp monitoring.  2) HTN: good control on 10mg  amlodipine, but we are cutting back to 5mg  as noted in #1 above. She'll monitor bp.  An After Visit Summary was printed and given to the patient.  FOLLOW UP: Return if symptoms worsen or fail to improve.  Signed:  Crissie Sickles, MD           06/06/2016

## 2016-06-06 NOTE — Patient Instructions (Addendum)
BP goal is <140 over <90.    Cut back amlodipine to 5mg  once daily.  Call in 2 wks with report of blood pressure average and tell if your swelling is better or same.

## 2016-06-06 NOTE — Progress Notes (Signed)
Pre visit review using our clinic review tool, if applicable. No additional management support is needed unless otherwise documented below in the visit note. 

## 2016-06-17 ENCOUNTER — Encounter: Payer: Self-pay | Admitting: Family Medicine

## 2016-06-17 ENCOUNTER — Ambulatory Visit (INDEPENDENT_AMBULATORY_CARE_PROVIDER_SITE_OTHER): Payer: Medicare Other | Admitting: Family Medicine

## 2016-06-17 VITALS — BP 116/68 | HR 81 | Temp 98.0°F | Resp 16 | Ht 60.0 in | Wt 145.0 lb

## 2016-06-17 DIAGNOSIS — N3021 Other chronic cystitis with hematuria: Secondary | ICD-10-CM | POA: Diagnosis not present

## 2016-06-17 DIAGNOSIS — N952 Postmenopausal atrophic vaginitis: Secondary | ICD-10-CM | POA: Diagnosis not present

## 2016-06-17 DIAGNOSIS — L089 Local infection of the skin and subcutaneous tissue, unspecified: Secondary | ICD-10-CM

## 2016-06-17 MED ORDER — DOXYCYCLINE HYCLATE 100 MG PO TABS: 100.0000 mg | ORAL_TABLET | Freq: Two times a day (BID) | ORAL | 0 refills | Status: DC

## 2016-06-17 NOTE — Patient Instructions (Signed)
Take doxycyline every 12 hours for 5 days. Watch to make certain it does not start to blister more, then would think maybe shingles, but just does not look that way right now.  Use neosporin over the area.   This looks more like a little skin infection.

## 2016-06-17 NOTE — Progress Notes (Signed)
Priscilla Cantrell , 1935-03-01, 81 y.o., female MRN: [id number] Patient Care Team    Relationship Specialty Notifications Start End  [name], MD PCP - General   01/16/10    Comment: Arnetha Gula, MD Consulting Physician Cardiology  06/06/10   Monna Fam, MD Consulting Physician Ophthalmology  01/29/11   Chesley Mires, MD Consulting Physician Pulmonary Disease  12/21/12   Latanya Maudlin, MD Consulting Physician Orthopedic Surgery  09/26/14   Richmond Campbell, MD Consulting Physician Gastroenterology  08/30/15   Cleon Gustin, MD Consulting Physician Urology  03/10/16     Chief Complaint  Patient presents with  . bump with red streak    left side x 2 - 3 days     Subjective: Pt presents for an OV with complaints of Red bump on right side of torso of 2-3 days duration.  Associated symptoms include redness. Patient states the area is not painful, or itching. There is no change in skin sensation. She noticed it in the mirror. She reports initially it looked like a little blister, and now it has a red base and a "red line "coming from it. She denies fevers, chills or drainage to the area. She has been putting Neosporin over the area. She has a history of recent shingles, however this was in a different location.  Depression screen [facility] 2/9 09/18/2015 09/06/2014  Decreased Interest 0 0  Down, Depressed, Hopeless 1 0  PHQ - 2 Score 1 0  Some recent data might be hidden    Allergies  Allergen Reactions  . Losartan Other (See Comments)    hyperkalemia  . Penicillins Hives, Swelling and Rash    Has patient had a PCN reaction causing immediate rash, facial/tongue/throat swelling, SOB or lightheadedness with hypotension: YES Has patient had a PCN reaction causing severe rash involving mucus membranes or skin necrosis: NO Has patient had a PCN reaction that required hospitalization NO Has patient had a PCN reaction occurring within the last 10 years: NO If all of the  above answers are "NO", then may proceed with Cephalosporin use.   . Cefixime [Kdc:Cefixime] Other (See Comments)    unspecified  . Indocin [Indomethacin] Other (See Comments)    Painful tongue and throat  . Sulfa Antibiotics Other (See Comments)    unspecified  . Ace Inhibitors Other (See Comments)    cough  . Statins Other (See Comments)    Myalgias    Social History  Substance Use Topics  . Smoking status: Former Smoker    Packs/day: 2.00    Years: 35.00    Types: Cigarettes    Quit date: 02/03/1990  . Smokeless tobacco: Never Used  . Alcohol use Yes     Comment: Rarely   Past Medical History:  Diagnosis Date  . Asthma   . Chest pain, non-cardiac    Cardiac CT showed no signif obstructive dz, mildly elevated calcium level (Dr. [name]).  . Chronic hypoxemic respiratory failure ([place]) 02/02/2015   2L oxygen 24/7  . COPD (chronic obstructive pulmonary disease) ([place])    spirometry 01/10/09 FEV 0.97(52%), FEV1% 47  . DDD (degenerative disc disease), cervical    1998 MRI C5-6 impingemt (left)  . DDD (degenerative disc disease), lumbar   . Depression with anxiety 04/15/2011  . Diarrheal disease Summer 2017   ? 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).  As of 10/05/15 pt  reports GI dx'd her with C diff and rx'd more flagyl and she is also on cholestyramine.  As of 02/2016, pt's sx's resolved completely.  . Diverticulosis of colon    Procto '97 and colonoscopy 2002  . Dysplastic nevus of upper extremity 04/2014   R tricep ([name])  . Family history of adverse reaction to anesthesia    mother died when patient age 24 receiving anesthesia for thyroid surgery  . Fibrocystic breast disease    w/fibroadenoma.  Bx's showed NO atypia (Dr. Margot Chimes).  Mammo neg 2008.  Marland Kitchen GERD (gastroesophageal reflux disease)   . History of double vision    Ophthalmologist, Dr. Herbert Deaner, is further  evaluating this with MRI  orbits and limited brain (myesthenia gravis testing neg 07/2010)  . History of home oxygen therapy    uses 2 liters at hs  . Hypertension    EKG 03/2010 normal  . Hypothyroidism    Hashimoto's, dx'd 1989  . Hypoxemia    O2 86% RA with exertion 01/10/09, 2 liters oxygen with exertion and sleep  . Lesion of right native kidney 2017   found on CT 02/2015.  F/u MRI 03/31/15 showed it to be smaller and less likely of any significance but repeat CT renal protocol in 34mo was recommended by radiology.  This was done 10/26/15 and showed no interval change in the lesion, but b/c the lesion enhances with contrast, radiol suggested urol consultation (b/c papillary renal cell ca could not be excluded).  Pt saw urol 03/06/16--they will follow.  . Osteoarthritis, hip, bilateral    End stage; R THA  . Pulmonary nodule    CT chest 12/10, June 2011.  No nodule on CT 06/2010.  Marland Kitchen Recurrent UTI    likely related to pt's atrophic vaginitis.  Urol started vaginal estrace 03/2016.  Marland Kitchen Shingles 02/2014   R abd/flank  . Spinal stenosis    Symptomatic (neurogenic claudication)  . 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 Upper airway cough syndrome    Past Surgical History:  Procedure Laterality Date  . ABDOMINAL HYSTERECTOMY  1978   no BSO per pt--nonmalignant reasons  . APPENDECTOMY    . BREAST CYST EXCISION     Last screening mammogram 10/2010 was normal.  . CHOLECYSTECTOMY  2001  . COLONOSCOPY  04/2005; 12/06/2015   2007 NORMAL.  2017 Polyp x 1--path pending as of 12/2015.  Mod sigmoid diverticulosis (Dr. Earlean Shawl).    Marland Kitchen HIP CLOSED REDUCTION Right 12/25/2015   Procedure: CLOSED REDUCTION HIP;  Surgeon: Nicholes Stairs, MD;  Location: [facility] ORS;  Service: Orthopedics;  Laterality: Right;  . TONSILLECTOMY    . TOTAL HIP ARTHROPLASTY Right 11/01/2014   Procedure: RIGHT TOTAL HIP ARTHROPLASTY;  Surgeon: Latanya Maudlin, MD;  Location: [facility]  ORS;  Service: Orthopedics;  Laterality: Right;  . TRANSTHORACIC ECHOCARDIOGRAM  08/2014   EF 60-65%, grade I diast dysfxn  . TUBAL LIGATION  1974   Family History  Problem Relation Age of Onset  . Goiter Mother   . Psoriasis Father        od liver  . Cirrhosis Father   . Diabetes Daughter        Type 2  . Cancer Paternal Aunt        stomach?  . Stroke Maternal Grandfather   . Stroke Paternal Grandmother   . Hypertension Paternal Grandmother   . Hernia Paternal Grandmother   . Cancer Paternal  Grandfather        lung   Allergies as of 06/17/2016      Reactions   Losartan Other (See Comments)   hyperkalemia   Penicillins Hives, Swelling, Rash   Has patient had a PCN reaction causing immediate rash, facial/tongue/throat swelling, SOB or lightheadedness with hypotension: YES Has patient had a PCN reaction causing severe rash involving mucus membranes or skin necrosis: NO Has patient had a PCN reaction that required hospitalization NO Has patient had a PCN reaction occurring within the last 10 years: NO If all of the above answers are "NO", then may proceed with Cephalosporin use.   Cefixime [kdc:cefixime] Other (See Comments)   unspecified   Indocin [indomethacin] Other (See Comments)   Painful tongue and throat   Sulfa Antibiotics Other (See Comments)   unspecified   Ace Inhibitors Other (See Comments)   cough   Statins Other (See Comments)   Myalgias      Medication List       Accurate as of 06/17/16  2:58 PM. Always use your most recent med list.          acetaminophen 500 MG tablet Commonly known as:  TYLENOL Take 500 mg by mouth every 6 (six) hours as needed for mild pain, moderate pain, fever or headache.   albuterol 108 (90 Base) MCG/ACT inhaler Commonly known as:  PROAIR HFA Inhale 2 puffs into the lungs every 4 (four) hours as needed.   albuterol (2.5 MG/3ML) 0.083% nebulizer solution Commonly known as:  PROVENTIL Take 3 mLs (2.5 mg total) by  nebulization every 6 (six) hours as needed for wheezing or shortness of breath.   amLODipine 10 MG tablet Commonly known as:  NORVASC Take 10 mg by mouth daily.   aspirin EC 81 MG tablet Take 81 mg by mouth daily.   cetirizine 10 MG tablet Commonly known as:  ZYRTEC Take 1 tablet (10 mg total) by mouth daily.   clindamycin 300 MG capsule Commonly known as:  CLEOCIN 2 tabs po 1 hour prior to dental procedure   DALIRESP 500 MCG Tabs tablet Generic drug:  roflumilast TAKE 1 TABLET BY MOUTH EVERY DAY   diazepam 5 MG tablet Commonly known as:  VALIUM TAKE 1 TABLET BY MOUTH EVERY MORNING AND 2 TABLETS BY MOUTH EVERY NIGHT AT BEDTIME   Fluticasone-Salmeterol 250-50 MCG/DOSE Aepb Commonly known as:  ADVAIR DISKUS INHALE 1 PUFF INTO THE LUNGS TWICE DAILY   PROBIOTIC DAILY PO Take 2 capsules by mouth daily.   REFRESH 1.4-0.6 % ophthalmic solution Generic drug:  polyvinyl alcohol-povidone Place 1 drop into both eyes as needed (for drys eyes).   SPIRIVA HANDIHALER 18 MCG inhalation capsule Generic drug:  tiotropium INHALE 1 CAPSULE VIA HANDIHALER EVERY DAY AT THE SAME TIME   SPIRIVA HANDIHALER 18 MCG inhalation capsule Generic drug:  tiotropium INHALE 1 CAPSULE VIA HANDIHALER EVERY DAY AT THE SAME TIME   SYNTHROID 88 MCG tablet Generic drug:  levothyroxine TAKE 1 TABLET BY MOUTH DAILY   venlafaxine 50 MG tablet Commonly known as:  EFFEXOR TAKE 1 TABLET(50 MG) BY MOUTH TWICE DAILY       All past medical history, surgical history, allergies, family history, immunizations andmedications were updated in the EMR today and reviewed under the history and medication portions of their EMR.     ROS: Negative, with the exception of above mentioned in HPI   Objective:  BP 116/68 (BP Location: Left Arm, Patient Position: Sitting, Cuff Size: Normal)  Pulse 81   Temp 98 F (36.7 C) (Oral)   Resp 16   Ht 5' (1.524 m)   Wt 145 lb (65.8 kg)   SpO2 92%   BMI 28.32 kg/m  Body  mass index is 28.32 kg/m. Gen: Afebrile. No acute distress. Nontoxic in appearance, well developed, well nourished.  HENT: AT. [place].  MMM, no oral lesions.  Eyes:Pupils Equal Round Reactive to light, Extraocular movements intact,  Conjunctiva without redness, discharge or icterus. Skin: 1 small (<79mm) raised excoriated rash/?vesicle on erythemic base. Diagonal red, mildly raised area extending up from excoriation in a linear fashion. No purpura or petechiae.  Neuro: Normal gait. PERLA. EOMi. Alert. Oriented x3  No exam data present No results found. No results found for this or any previous visit (from the past 24 hour(s)).  Assessment/Plan: CHARMIKA MACDONNELL is a 81 y.o. female present for OV for  Skin infection - Area appears to be a small boil/skin infection that has been excoriated. The red linear area above does not appear to be any type of infection but more  irritation from the area being scratched. Uncertain etiology given history of shingles outbreak, however her shingles outbreak was in a different location and current condition is nonpruritic and not painful with no prodrome. - Favor skin infection, will provide short course of doxycycline since she is allergic to cephalosporins, sulfa and penicillin. - Patient is to continue monitoring the area. If area does not heal, or starts to resemble more of a shingles outbreak she can call in and I will prescribe Valtrex for her immediately. Patient understands and is in agreement with plan. -Follow-up when necessary   Reviewed expectations re: course of current medical issues.  Discussed self-management of symptoms.  Outlined signs and symptoms indicating need for more acute intervention.  Patient verbalized understanding and all questions were answered.  Patient received an After-Visit Summary.     Note is dictated utilizing voice recognition software. Although note has been proof read prior to signing, occasional typographical  errors still can be missed. If any questions arise, please do not hesitate to call for verification.   electronically signed by:  Howard Pouch, DO  [place]

## 2016-06-19 ENCOUNTER — Encounter: Payer: Self-pay | Admitting: Family Medicine

## 2016-06-20 ENCOUNTER — Telehealth: Payer: Self-pay | Admitting: *Deleted

## 2016-06-20 NOTE — Telephone Encounter (Signed)
Pt called to report BP Readings.   06/11/16 - 145/82 HR 72 06/05/16 - 149/89 HR 74 06/19/16 - 115/81 HR 82 06/20/16 - 120/70 HR 90

## 2016-06-20 NOTE — Telephone Encounter (Signed)
Pt advised and voiced understanding.   

## 2016-06-20 NOTE — Telephone Encounter (Signed)
These numbers are fine.  Reassure her.-thx

## 2016-07-03 ENCOUNTER — Other Ambulatory Visit: Payer: Self-pay | Admitting: Pulmonary Disease

## 2016-07-06 ENCOUNTER — Other Ambulatory Visit: Payer: Self-pay | Admitting: Pulmonary Disease

## 2016-07-25 DIAGNOSIS — Z85828 Personal history of other malignant neoplasm of skin: Secondary | ICD-10-CM | POA: Diagnosis not present

## 2016-07-25 DIAGNOSIS — D229 Melanocytic nevi, unspecified: Secondary | ICD-10-CM | POA: Diagnosis not present

## 2016-07-29 ENCOUNTER — Encounter: Payer: Self-pay | Admitting: Pulmonary Disease

## 2016-07-29 ENCOUNTER — Ambulatory Visit (INDEPENDENT_AMBULATORY_CARE_PROVIDER_SITE_OTHER): Payer: Medicare Other | Admitting: Pulmonary Disease

## 2016-07-29 VITALS — BP 132/74 | HR 72 | Ht 60.0 in | Wt 142.0 lb

## 2016-07-29 DIAGNOSIS — J42 Unspecified chronic bronchitis: Secondary | ICD-10-CM

## 2016-07-29 DIAGNOSIS — J9611 Chronic respiratory failure with hypoxia: Secondary | ICD-10-CM | POA: Diagnosis not present

## 2016-07-29 NOTE — Patient Instructions (Signed)
Will arrange for overnight oxygen test  Follow up in 6 months 

## 2016-07-29 NOTE — Progress Notes (Signed)
Current Outpatient Prescriptions on File Prior to Visit  Medication Sig  . acetaminophen (TYLENOL) 500 MG tablet Take 500 mg by mouth every 6 (six) hours as needed for mild pain, moderate pain, fever or headache.  . ADVAIR DISKUS 250-50 MCG/DOSE AEPB INHALE 1 PUFF INTO THE LUNGS TWICE DAILY  . albuterol (PROAIR HFA) 108 (90 Base) MCG/ACT inhaler Inhale 2 puffs into the lungs every 4 (four) hours as needed.  Marland Kitchen albuterol (PROVENTIL) (2.5 MG/3ML) 0.083% nebulizer solution Take 3 mLs (2.5 mg total) by nebulization every 6 (six) hours as needed for wheezing or shortness of breath.  Marland Kitchen amLODipine (NORVASC) 10 MG tablet Take 10 mg by mouth daily.  Marland Kitchen aspirin EC 81 MG tablet Take 81 mg by mouth daily.  . cetirizine (ZYRTEC) 10 MG tablet Take 1 tablet (10 mg total) by mouth daily. (Patient taking differently: Take 10-20 mg by mouth daily as needed for allergies. )  . DALIRESP 500 MCG TABS tablet TAKE 1 TABLET BY MOUTH EVERY DAY  . diazepam (VALIUM) 5 MG tablet TAKE 1 TABLET BY MOUTH EVERY MORNING AND 2 TABLETS BY MOUTH EVERY NIGHT AT BEDTIME  . polyvinyl alcohol-povidone (REFRESH) 1.4-0.6 % ophthalmic solution Place 1 drop into both eyes as needed (for drys eyes).  . Probiotic Product (PROBIOTIC DAILY PO) Take 2 capsules by mouth daily.   Marland Kitchen SPIRIVA HANDIHALER 18 MCG inhalation capsule INHALE 1 CAPSULE VIA HANDIHALER EVERY DAY AT THE SAME TIME  . SPIRIVA HANDIHALER 18 MCG inhalation capsule INHALE 1 CAPSULE VIA HANDIHALER EVERY DAY AT THE SAME TIME  . SYNTHROID 88 MCG tablet TAKE 1 TABLET BY MOUTH DAILY  . venlafaxine (EFFEXOR) 50 MG tablet TAKE 1 TABLET(50 MG) BY MOUTH TWICE DAILY (Patient taking differently: TAKE 25-50 mg BY MOUTH twice a day)   No current facility-administered medications on file prior to visit.     Chief Complaint  Patient presents with  . Follow-up    Pt denies any cough. Pt notes increased SOB with hot humid weather. Pt uses O2 at night @ 2 liters and as needed for low O2 levels.  Notes increased fatigue x past several months.    Pulmonary tests Spirometry 01/10/09>>FEV1 0.97(52%), FEV1% 47 March 2012>>Daliresp started  Sleep tests PSG 11/14/11 >> AHI 0.3  Cardiac tests Echo 08/30/14 >> EF 60 to 81%, grade 1 diastolic dysfx, PAS 24 mmHG  Past medical history Spinal stenosis, GERD, Hypothyroidism, Depression, Anxiety  Past surgical history, Allergies, Family history, Social history reviewed  Vital signs BP 132/74 (BP Location: Left Arm, Cuff Size: Normal)   Pulse 72   Ht 5' (1.524 m)   Wt 142 lb (64.4 kg)   SpO2 96%   BMI 27.73 kg/m   History of Present Illness: Priscilla Cantrell is a 81 y.o. female former smoker with GOLD 4 COPD, ACE related cough, and hypoxemia on 2 liters oxygen with sleep.  She has noticed more fatigue and trouble with her breathing at times.  Other days she feels okay.  She is not having cough, wheeze, sputum.  She gets intermittent random pains in her back and side that last for a few seconds.  She uses her oxygen during the day with exercise and at night.  She wonders if she could get something that is more portable to travel with >> she has been renting POC before.  She does get achy feeling in her legs when she walks too much.  Her mood has been okay otherwise.   Physical Exam:  General - Thin HEENT - No sinus tenderness, no oral exudate, no LAN Cardiac - s1s2 no murmur Chest - prolonged exhalation, no wheeze/rales Abdomen - soft, nontender Extremities - no edema Skin - no rashes Neurologic - normal strength Psychiatric - normal mood, behavior   Assessment/Plan:  COPD with chronic bronchitis and emphysema. - continue spiriva, advair, and daliresp - prn proair - had detailed discussion about natural history of COPD, and role of follow up testing  Chronic respiratory failure with hypoxia. - will arrange for ONO with room air to determine her current oxygen needs - continue oxygen during the day - she will let us know  if she wants to switch to a POC  Time spent 28 minutes  Patient Instructions  Will arrange for overnight oxygen test  Follow up in 6 months   Priscilla Mires, MD [place] Pager:  [phone] 05/23/2015 07/29/2016, 10:34 AM

## 2016-07-30 ENCOUNTER — Encounter: Payer: Self-pay | Admitting: Pulmonary Disease

## 2016-07-30 DIAGNOSIS — J984 Other disorders of lung: Secondary | ICD-10-CM | POA: Diagnosis not present

## 2016-07-30 DIAGNOSIS — J449 Chronic obstructive pulmonary disease, unspecified: Secondary | ICD-10-CM | POA: Diagnosis not present

## 2016-07-30 DIAGNOSIS — R0902 Hypoxemia: Secondary | ICD-10-CM | POA: Diagnosis not present

## 2016-07-31 DIAGNOSIS — J449 Chronic obstructive pulmonary disease, unspecified: Secondary | ICD-10-CM | POA: Diagnosis not present

## 2016-07-31 DIAGNOSIS — J984 Other disorders of lung: Secondary | ICD-10-CM | POA: Diagnosis not present

## 2016-07-31 DIAGNOSIS — R0902 Hypoxemia: Secondary | ICD-10-CM | POA: Diagnosis not present

## 2016-08-05 ENCOUNTER — Telehealth: Payer: Self-pay | Admitting: Pulmonary Disease

## 2016-08-05 NOTE — Telephone Encounter (Signed)
Pt states that she completed the ONO and is wanting to know the results.  Called [facility] for ONO results to be faxed over - spoke with Amy - results are being faxed to main fax ATTN: Dr [name].  Will hold in my box to ensure these are received.

## 2016-08-08 ENCOUNTER — Ambulatory Visit (INDEPENDENT_AMBULATORY_CARE_PROVIDER_SITE_OTHER): Payer: Medicare Other | Admitting: Family Medicine

## 2016-08-08 ENCOUNTER — Encounter: Payer: Self-pay | Admitting: Family Medicine

## 2016-08-08 VITALS — BP 122/75 | HR 77 | Temp 97.6°F | Resp 20 | Wt 143.5 lb

## 2016-08-08 DIAGNOSIS — W57XXXA Bitten or stung by nonvenomous insect and other nonvenomous arthropods, initial encounter: Secondary | ICD-10-CM

## 2016-08-08 DIAGNOSIS — S60561A Insect bite (nonvenomous) of right hand, initial encounter: Secondary | ICD-10-CM

## 2016-08-08 DIAGNOSIS — B0223 Postherpetic polyneuropathy: Secondary | ICD-10-CM | POA: Diagnosis not present

## 2016-08-08 MED ORDER — TRIAMCINOLONE ACETONIDE 0.1 % EX CREA: 1.0000 "application " | TOPICAL_CREAM | Freq: Two times a day (BID) | CUTANEOUS | 0 refills | Status: DC

## 2016-08-08 NOTE — Patient Instructions (Addendum)
Try steroid cream called in for local reaction.  Start antihistamine of choice next few days.  Ask pharmacist about lidocaine patches/creams for shingles.   I think your current situation is a histamine reaction, not infection.     Please help [place] help you:  We are honored you have chosen [facility] for your Primary Care home. Below you will find basic instructions that you may need to access in the future. Please help [place] help you by reading the instructions, which cover many of the frequent questions we experience.   Prescription refills and request:  -In order to allow more efficient response time, please call your pharmacy for all refills. They will forward the request electronically to [place]. This allows for the quickest possible response. Request left on a nurse line can take longer to refill, since these are checked as time allows between office patients and other phone calls.  - refill request can take up to 3-5 working days to complete.  - If request is sent electronically and request is appropiate, it is usually completed in 1-2 business days.  - all patients will need to be seen routinely for all chronic medical conditions requiring prescription medications (see follow-up below). If you are overdue for follow up on your condition, you will be asked to make an appointment and we will call in enough medication to cover you until your appointment (up to 30 days).  - all controlled substances will require a face to face visit to request/refill.  - if you desire your prescriptions to go through a new pharmacy, and have an active script at original pharmacy, you will need to call your pharmacy and have scripts transferred to new pharmacy. This is completed between the pharmacy locations and not by your provider.    Results: If any images or labs were ordered, it can take up to 1 week to get results depending on the test ordered and the lab/facility running and resulting the test. -  Normal or stable results, which do not need further discussion, may be released to your mychart immediately with attached note to you. A call may not be generated for normal results. Please make certain to sign up for mychart. If you have questions on how to activate your mychart you can call the front office.  - If your results need further discussion, our office will attempt to contact you via phone, and if unable to reach you after 2 attempts, we will release your abnormal result to your mychart with instructions.  - All results will be automatically released in mychart after 1 week.  - Your provider will provide you with explanation and instruction on all relevant material in your results. Please keep in mind, results and labs may appear confusing or abnormal to the untrained eye, but it does not mean they are actually abnormal for you personally. If you have any questions about your results that are not covered, or you desire more detailed explanation than what was provided, you should make an appointment with your provider to do so.   Our office handles many outgoing and incoming calls daily. If we have not contacted you within 1 week about your results, please check your mychart to see if there is a message first and if not, then contact our office.  In helping with this matter, you help decrease call volume, and therefore allow [place] to be able to respond to patients needs more efficiently.   Acute office visits (sick visit):  An acute visit is intended for a new problem and are scheduled in shorter time slots to allow schedule openings for patients with new problems. This is the appropriate visit to discuss a new problem. In order to provide you with excellent quality medical care with proper time for you to explain your problem, have an exam and receive treatment with instructions, these appointments should be limited to one new problem per visit. If you experience a new problem, in which you desire  to be addressed, please make an acute office visit, we save openings on the schedule to accommodate you. Please do not save your new problem for any other type of visit, let us take care of it properly and quickly for you.   Follow up visits:  Depending on your condition(s) your provider will need to see you routinely in order to provide you with quality care and prescribe medication(s). Most chronic conditions (Example: hypertension, Diabetes, depression/anxiety... etc), require visits a couple times a year. Your provider will instruct you on proper follow up for your personal medical conditions and history. Please make certain to make follow up appointments for your condition as instructed. Failing to do so could result in lapse in your medication treatment/refills. If you request a refill, and are overdue to be seen on a condition, we will always provide you with a 30 day script (once) to allow you time to schedule.    Medicare wellness (well visit): - we have a wonderful Nurse Maudie Mercury), that will meet with you and provide you will yearly medicare wellness visits. These visits should occur yearly (can not be scheduled less than 1 calendar year apart) and cover preventive health, immunizations, advance directives and screenings you are entitled to yearly through your medicare benefits. Do not miss out on your entitled benefits, this is when medicare will pay for these benefits to be ordered for you.  These are strongly encouraged by your provider and is the appropriate type of visit to make certain you are up to date with all preventive health benefits. If you have not had your medicare wellness exam in the last 12 months, please make certain to schedule one by calling the office and schedule your medicare wellness with Maudie Mercury as soon as possible.   Yearly physical (well visit):  - Adults are recommended to be seen yearly for physicals. Check with your insurance and date of your last physical, most insurances  require one calendar year between physicals. Physicals include all preventive health topics, screenings, medical exam and labs that are appropriate for gender/age and history. You may have fasting labs needed at this visit. This is a well visit (not a sick visit), new problems should not be covered during this visit (see acute visit).  - Pediatric patients are seen more frequently when they are younger. Your provider will advise you on well child visit timing that is appropriate for your their age. - This is not a medicare wellness visit. Medicare wellness exams do not have an exam portion to the visit. Some medicare companies allow for a physical, some do not allow a yearly physical. If your medicare allows a yearly physical you can schedule the medicare wellness with our nurse Maudie Mercury and have your physical with your provider after, on the same day. Please check with insurance for your full benefits.   Late Policy/No Shows:  - all new patients should arrive 15-30 minutes earlier than appointment to allow [place] time  to  obtain all personal demographics,  insurance information and for you to complete office paperwork. - All established patients should arrive 10-15 minutes earlier than appointment time to update all information and be checked in .  - In our best efforts to run on time, if you are late for your appointment you will be asked to either reschedule or if able, we will work you back into the schedule. There will be a wait time to work you back in the schedule,  depending on availability.  - If you are unable to make it to your appointment as scheduled, please call 24 hours ahead of time to allow [place] to fill the time slot with someone else who needs to be seen. If you do not cancel your appointment ahead of time, you may be charged a no show fee.

## 2016-08-08 NOTE — Progress Notes (Signed)
Priscilla Cantrell , 01-06-1936, 81 y.o., female MRN: [id number] Patient Care Team    Relationship Specialty Notifications Start End  [name], MD PCP - General   01/16/10    Comment: Arnetha Gula, MD Consulting Physician Cardiology  06/06/10   Monna Fam, MD Consulting Physician Ophthalmology  01/29/11   Chesley Mires, MD Consulting Physician Pulmonary Disease  12/21/12   Latanya Maudlin, MD Consulting Physician Orthopedic Surgery  09/26/14   Richmond Campbell, MD Consulting Physician Gastroenterology  08/30/15   Cleon Gustin, MD Consulting Physician Urology  03/10/16     Chief Complaint  Patient presents with  . Insect Bite    right hand 2 days ago     Subjective: Pt presents for an OV with complaints of bug bite of 2 days duration.  Associated symptoms include redness and itchiness. Patient states she has to but bites 1 on her right hand and one on her right wrist. He denies any fevers, chills or drainage. She reports the sites are mildly red and raised. Patient has tried baking soda paste to help with itching, that does help. Allergic to PCN, sulfa and cephalosporins.    Depression screen [facility] 2/9 09/18/2015 09/06/2014  Decreased Interest 0 0  Down, Depressed, Hopeless 1 0  PHQ - 2 Score 1 0  Some recent data might be hidden    Allergies  Allergen Reactions  . Losartan Other (See Comments)    hyperkalemia  . Penicillins Hives, Swelling and Rash    Has patient had a PCN reaction causing immediate rash, facial/tongue/throat swelling, SOB or lightheadedness with hypotension: YES Has patient had a PCN reaction causing severe rash involving mucus membranes or skin necrosis: NO Has patient had a PCN reaction that required hospitalization NO Has patient had a PCN reaction occurring within the last 10 years: NO If all of the above answers are "NO", then may proceed with Cephalosporin use.   . Cefixime [Kdc:Cefixime] Other (See Comments)    unspecified  .  Indocin [Indomethacin] Other (See Comments)    Painful tongue and throat  . Sulfa Antibiotics Other (See Comments)    unspecified  . Ace Inhibitors Other (See Comments)    cough  . Statins Other (See Comments)    Myalgias    Social History  Substance Use Topics  . Smoking status: Former Smoker    Packs/day: 2.00    Years: 35.00    Types: Cigarettes    Quit date: 02/03/1990  . Smokeless tobacco: Never Used  . Alcohol use Yes     Comment: Rarely   Past Medical History:  Diagnosis Date  . Asthma   . Chest pain, non-cardiac    Cardiac CT showed no signif obstructive dz, mildly elevated calcium level (Dr. [name]).  . Chronic cystitis with hematuria    [name]  . Chronic hypoxemic respiratory failure ([place]) 02/02/2015   2L oxygen 24/7  . COPD (chronic obstructive pulmonary disease) ([place])    spirometry 01/10/09 FEV 0.97(52%), FEV1% 47  . DDD (degenerative disc disease), cervical    1998 MRI C5-6 impingemt (left)  . DDD (degenerative disc disease), lumbar   . Depression with anxiety 04/15/2011  . Diarrheal disease Summer 2017   ? 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).  As of 10/05/15 pt reports GI dx'd her with C diff and rx'd more flagyl and she is also on cholestyramine.  As of 02/2016, pt's sx's resolved completely.  . Diverticulosis of colon    Procto '97 and colonoscopy 2002  . Dysplastic nevus of upper extremity 04/2014   R tricep ([name])  . Family history of adverse reaction to anesthesia    mother died when patient age 54 receiving anesthesia for thyroid surgery  . Fibrocystic breast disease    w/fibroadenoma.  Bx's showed NO atypia (Dr. Margot Chimes).  Mammo neg 2008.  Marland Kitchen GERD (gastroesophageal reflux disease)   . History of double vision    Ophthalmologist, Dr. Herbert Deaner, is further evaluating this with MRI  orbits and limited brain (myesthenia gravis testing  neg 07/2010)  . History of home oxygen therapy    uses 2 liters at hs  . Hypertension    EKG 03/2010 normal  . Hypothyroidism    Hashimoto's, dx'd 1989  . Hypoxemia    O2 86% RA with exertion 01/10/09, 2 liters oxygen with exertion and sleep  . Lesion of right native kidney 2017   found on CT 02/2015.  F/u MRI 03/31/15 showed it to be smaller and less likely of any significance but repeat CT renal protocol in 90mo was recommended by radiology.  This was done 10/26/15 and showed no interval change in the lesion, but b/c the lesion enhances with contrast, radiol suggested urol consultation (b/c papillary renal cell ca could not be excluded).  Pt saw urol 03/06/16--they will follow.  . Osteoarthritis, hip, bilateral    End stage; R THA  . Postmenopausal atrophic vaginitis    improved with estrace 2 X/week.  . Pulmonary nodule    CT chest 12/10, June 2011.  No nodule on CT 06/2010.  Marland Kitchen Recurrent UTI    likely related to pt's atrophic vaginitis.  Urol started vaginal estrace 03/2016.  Marland Kitchen Shingles 02/2014   R abd/flank  . Spinal stenosis    Symptomatic (neurogenic claudication)  . 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 Upper airway cough syndrome    Past Surgical History:  Procedure Laterality Date  . ABDOMINAL HYSTERECTOMY  1978   no BSO per pt--nonmalignant reasons  . APPENDECTOMY    . BREAST CYST EXCISION     Last screening mammogram 10/2010 was normal.  . CHOLECYSTECTOMY  2001  . COLONOSCOPY  04/2005; 12/06/2015   2007 NORMAL.  2017 Polyp x 1--path pending as of 12/2015.  Mod sigmoid diverticulosis (Dr. Earlean Shawl).    Marland Kitchen HIP CLOSED REDUCTION Right 12/25/2015   Procedure: CLOSED REDUCTION HIP;  Surgeon: Nicholes Stairs, MD;  Location: [facility];  Service: Orthopedics;  Laterality: Right;  . TONSILLECTOMY    . TOTAL HIP ARTHROPLASTY Right 11/01/2014   Procedure: RIGHT TOTAL HIP ARTHROPLASTY;  Surgeon: Latanya Maudlin, MD;  Location: [facility];   Service: Orthopedics;  Laterality: Right;  . TRANSTHORACIC ECHOCARDIOGRAM  08/2014   EF 60-65%, grade I diast dysfxn  . TUBAL LIGATION  1974   Family History  Problem Relation Age of Onset  . Goiter Mother   . Psoriasis Father        od liver  . Cirrhosis Father   . Diabetes Daughter        Type 2  . Cancer Paternal Aunt        stomach?  . Stroke Maternal Grandfather   . Stroke Paternal Grandmother   . Hypertension Paternal Grandmother   . Hernia Paternal Grandmother   . Cancer Paternal Grandfather  lung   Allergies as of 08/08/2016      Reactions   Losartan Other (See Comments)   hyperkalemia   Penicillins Hives, Swelling, Rash   Has patient had a PCN reaction causing immediate rash, facial/tongue/throat swelling, SOB or lightheadedness with hypotension: YES Has patient had a PCN reaction causing severe rash involving mucus membranes or skin necrosis: NO Has patient had a PCN reaction that required hospitalization NO Has patient had a PCN reaction occurring within the last 10 years: NO If all of the above answers are "NO", then may proceed with Cephalosporin use.   Cefixime [kdc:cefixime] Other (See Comments)   unspecified   Indocin [indomethacin] Other (See Comments)   Painful tongue and throat   Sulfa Antibiotics Other (See Comments)   unspecified   Ace Inhibitors Other (See Comments)   cough   Statins Other (See Comments)   Myalgias      Medication List       Accurate as of 08/08/16 10:22 AM. Always use your most recent med list.          acetaminophen 500 MG tablet Commonly known as:  TYLENOL Take 500 mg by mouth every 6 (six) hours as needed for mild pain, moderate pain, fever or headache.   ADVAIR DISKUS 250-50 MCG/DOSE Aepb Generic drug:  Fluticasone-Salmeterol INHALE 1 PUFF INTO THE LUNGS TWICE DAILY   albuterol 108 (90 Base) MCG/ACT inhaler Commonly known as:  PROAIR HFA Inhale 2 puffs into the lungs every 4 (four) hours as needed.     albuterol (2.5 MG/3ML) 0.083% nebulizer solution Commonly known as:  PROVENTIL Take 3 mLs (2.5 mg total) by nebulization every 6 (six) hours as needed for wheezing or shortness of breath.   amLODipine 10 MG tablet Commonly known as:  NORVASC Take 10 mg by mouth daily.   aspirin EC 81 MG tablet Take 81 mg by mouth daily.   cetirizine 10 MG tablet Commonly known as:  ZYRTEC Take 1 tablet (10 mg total) by mouth daily.   DALIRESP 500 MCG Tabs tablet Generic drug:  roflumilast TAKE 1 TABLET BY MOUTH EVERY DAY   diazepam 5 MG tablet Commonly known as:  VALIUM TAKE 1 TABLET BY MOUTH EVERY MORNING AND 2 TABLETS BY MOUTH EVERY NIGHT AT BEDTIME   fluorometholone 0.1 % ophthalmic suspension Commonly known as:  FML SHAKE LQ AND INT 1 GTT IN OU TID   PROBIOTIC DAILY PO Take 2 capsules by mouth daily.   REFRESH 1.4-0.6 % ophthalmic solution Generic drug:  polyvinyl alcohol-povidone Place 1 drop into both eyes as needed (for drys eyes).   SPIRIVA HANDIHALER 18 MCG inhalation capsule Generic drug:  tiotropium INHALE 1 CAPSULE VIA HANDIHALER EVERY DAY AT THE SAME TIME   SPIRIVA HANDIHALER 18 MCG inhalation capsule Generic drug:  tiotropium INHALE 1 CAPSULE VIA HANDIHALER EVERY DAY AT THE SAME TIME   SYNTHROID 88 MCG tablet Generic drug:  levothyroxine TAKE 1 TABLET BY MOUTH DAILY   venlafaxine 50 MG tablet Commonly known as:  EFFEXOR TAKE 1 TABLET(50 MG) BY MOUTH TWICE DAILY       All past medical history, surgical history, allergies, family history, immunizations andmedications were updated in the EMR today and reviewed under the history and medication portions of their EMR.     ROS: Negative, with the exception of above mentioned in HPI   Objective:  BP 122/75 (BP Location: Left Arm, Patient Position: Sitting, Cuff Size: Normal)   Pulse 77   Temp 97.6 F (36.4  C)   Resp 20   Wt 143 lb 8 oz (65.1 kg)   SpO2 93%   BMI 28.03 kg/m  Body mass index is 28.03  kg/m. Gen: Afebrile. No acute distress. Nontoxic in appearance, well developed, well nourished.  Skin: No rashes, purpura or petechiae. Mild redness surrounding insect bite right ulnar aspect of palm and midline right wrist. No drainage. No swelling. No exam data present No results found. No results found for this or any previous visit (from the past 24 hour(s)).  Assessment/Plan: Priscilla Cantrell is a 81 y.o. female present for OV for  Insect bite, initial encounter - Both areas appear to be a mild histamine reaction. Encourage patient to take a daily antihistamine of choice if able to tolerate. - Triamcinolone cream prescribed for topical comfort. - Discussed bug repellent use to try to prevent further insect bites.  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 She asked about medication to help with this. Discussed different types of medication since to help with neuropathy secondary to herpes zoster and also different types of topical/lidocaine type preparations that are available over-the-counter. She would like to try the over-the-counter, and if does not seem to resolve and she will discuss with her PCP.   Reviewed expectations re: course of current medical issues.  Discussed self-management of symptoms.  Outlined signs and symptoms indicating need for more acute intervention.  Patient verbalized understanding and all questions were answered.  Patient received an After-Visit Summary.    No orders of the defined types were placed in this encounter.  Note is dictated utilizing voice recognition software. Although note has been proof read prior to signing, occasional typographical errors still can be missed. If any questions arise, please do not hesitate to call for verification.   electronically signed by:  Howard Pouch, DO  [place]

## 2016-08-08 NOTE — Telephone Encounter (Signed)
ONO with RA 07/31/16 >> test time 6 hrs 41 min.  Average SpO2 87%, low SpO2 74%.  Spent 4 hrs 13 min with SpO2 < 88%.   Please let her know that oxygen level remains low at night, and she should continue 2 liters oxygen at night.

## 2016-08-08 NOTE — Telephone Encounter (Signed)
Spoke with pt and informed her of her results per VS. Pt understood and had no further questions at this time. Nothing further is needed

## 2016-08-15 ENCOUNTER — Telehealth: Payer: Self-pay | Admitting: Family Medicine

## 2016-08-15 MED ORDER — AMLODIPINE BESYLATE 5 MG PO TABS
5.0000 mg | ORAL_TABLET | Freq: Every day | ORAL | 3 refills | Status: DC
Start: 2016-08-15 — End: 2017-09-19

## 2016-08-15 NOTE — Telephone Encounter (Signed)
Amlodipine 5mg  qd eRx'd

## 2016-08-15 NOTE — Telephone Encounter (Signed)
Patient is requesting refill Amlodipine 5 MG. This is a change. She has been using a pill cutter but is losing part of the pill. [facility] [place]. Patient will be taking 1 a day.

## 2016-08-15 NOTE — Telephone Encounter (Signed)
Please advise. Thanks.  

## 2016-08-15 NOTE — Telephone Encounter (Signed)
Pt advised and voiced understanding.   

## 2016-08-19 ENCOUNTER — Other Ambulatory Visit: Payer: Self-pay | Admitting: Pulmonary Disease

## 2016-08-22 DIAGNOSIS — H16229 Keratoconjunctivitis sicca, not specified as Sjogren's, unspecified eye: Secondary | ICD-10-CM | POA: Diagnosis not present

## 2016-08-22 DIAGNOSIS — H04123 Dry eye syndrome of bilateral lacrimal glands: Secondary | ICD-10-CM | POA: Diagnosis not present

## 2016-08-28 ENCOUNTER — Other Ambulatory Visit: Payer: Self-pay | Admitting: Family Medicine

## 2016-09-09 DIAGNOSIS — N952 Postmenopausal atrophic vaginitis: Secondary | ICD-10-CM | POA: Diagnosis not present

## 2016-09-09 DIAGNOSIS — N3021 Other chronic cystitis with hematuria: Secondary | ICD-10-CM | POA: Diagnosis not present

## 2016-09-09 DIAGNOSIS — D49511 Neoplasm of unspecified behavior of right kidney: Secondary | ICD-10-CM | POA: Diagnosis not present

## 2016-09-11 ENCOUNTER — Encounter: Payer: Self-pay | Admitting: Family Medicine

## 2016-09-17 ENCOUNTER — Encounter: Payer: Self-pay | Admitting: Family Medicine

## 2016-09-17 ENCOUNTER — Telehealth: Payer: Self-pay | Admitting: Family Medicine

## 2016-09-17 ENCOUNTER — Ambulatory Visit (INDEPENDENT_AMBULATORY_CARE_PROVIDER_SITE_OTHER): Payer: Medicare Other | Admitting: Family Medicine

## 2016-09-17 VITALS — BP 123/71 | HR 75 | Temp 97.3°F | Resp 16 | Ht 60.0 in | Wt 143.5 lb

## 2016-09-17 DIAGNOSIS — Z8619 Personal history of other infectious and parasitic diseases: Secondary | ICD-10-CM | POA: Diagnosis not present

## 2016-09-17 DIAGNOSIS — R197 Diarrhea, unspecified: Secondary | ICD-10-CM

## 2016-09-17 DIAGNOSIS — F331 Major depressive disorder, recurrent, moderate: Secondary | ICD-10-CM | POA: Diagnosis not present

## 2016-09-17 DIAGNOSIS — A0471 Enterocolitis due to Clostridium difficile, recurrent: Secondary | ICD-10-CM

## 2016-09-17 DIAGNOSIS — Z860100 Personal history of colon polyps, unspecified: Secondary | ICD-10-CM

## 2016-09-17 MED ORDER — HYOSCYAMINE SULFATE 0.125 MG SL SUBL: SUBLINGUAL_TABLET | SUBLINGUAL | 3 refills | Status: DC

## 2016-09-17 MED ORDER — VENLAFAXINE HCL 50 MG PO TABS: ORAL_TABLET | ORAL | 3 refills | Status: DC

## 2016-09-17 NOTE — Telephone Encounter (Signed)
Noted  

## 2016-09-17 NOTE — Telephone Encounter (Signed)
Please advise. Thanks.  

## 2016-09-17 NOTE — Telephone Encounter (Signed)
Patient states the doctor who she has been treating her for C-diff is no longer practicing.  She wants to know if Dr. Anitra Lauth can treat her for this or if can recommend another doctor for her.

## 2016-09-17 NOTE — Telephone Encounter (Signed)
Pls see if pt can come in to see me this afternoon.-thx

## 2016-09-17 NOTE — Telephone Encounter (Signed)
Patient scheduled at 3pm.

## 2016-09-17 NOTE — Progress Notes (Signed)
OFFICE VISIT  09/17/2016   CC:  Chief Complaint  Patient presents with  . Diarrhea    C.diff?  Marland Kitchen Abdominal Pain    lower   HPI:    Patient is a 81 y.o. Caucasian female with a history of recurrent/refractory C diff infection in 2017, who presents for gastrointestinal complaints. Onset of diarrhea about 1 mo ago, initially w/out "that C diff odor".  Avg frequency of loose BMs is 3-4 per day. BMs loose but not watery. She took herself off dairy and caffeine just 1 week ago, can't tell any difference in sx's yet. Has had some lower abd on and off x 1 mo as well.   No n/v.  No fever.  No blood in stool.  No melena. Foul "c diff" odor noted this morning.  She has tried otc imodium and this does help but she uses it sparingly. Her BMs are urgent but she can make it to bathroom.  When she had c diff the urgency was more intense.  Her most recent antibiotic course was 06/17/16---doxycycline.  We talked some about her mood: depressed and hopeless some days, gets emotional easily watching something sad on TV, doesn't want to be around people.  Sounds like about 1/3 of her days are like this. Other days she is a bit more normal, gets out into yard and "piddles".  Having some irritability lately. No SI or HI.  Eating ok.  Not sleeping very well.  She had been taking 1/2 venlafaxine 50mg  tab once daily, but has gradually increased to a whole tab at least once a day and some days lately she has taken 1 whole twice daily.  Past Medical History:  Diagnosis Date  . Asthma   . Chest pain, non-cardiac    Cardiac CT showed no signif obstructive dz, mildly elevated calcium level (Dr. [name]).  . Chronic cystitis with hematuria    [name]  . Chronic hypoxemic respiratory failure ([place]) 02/02/2015   2L oxygen 24/7  . COPD (chronic obstructive pulmonary disease) ([place])    spirometry 01/10/09 FEV 0.97(52%), FEV1% 47  . DDD (degenerative disc disease), cervical    1998 MRI C5-6 impingemt  (left)  . DDD (degenerative disc disease), lumbar   . Depression with anxiety 04/15/2011  . Diarrheal disease Summer 2017   ? 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).  As of 10/05/15 pt reports GI dx'd her with C diff and rx'd more flagyl and she is also on cholestyramine.  As of 02/2016, pt's sx's resolved completely.  . Diverticulosis of colon    Procto '97 and colonoscopy 2002  . Dysplastic nevus of upper extremity 04/2014   R tricep ([name])  . Family history of adverse reaction to anesthesia    mother died when patient age 68 receiving anesthesia for thyroid surgery  . Fibrocystic breast disease    w/fibroadenoma.  Bx's showed NO atypia (Dr. Margot Chimes).  Mammo neg 2008.  Marland Kitchen GERD (gastroesophageal reflux disease)   . History of double vision    Ophthalmologist, Dr. Herbert Deaner, is further evaluating this with MRI  orbits and limited brain (myesthenia gravis testing neg 07/2010)  . History of home oxygen therapy    uses 2 liters at hs  . Hypertension    EKG 03/2010 normal  . Hypothyroidism    Hashimoto's, dx'd 1989  . Hypoxemia    O2 86% RA with exertion 01/10/09, 2 liters  oxygen with exertion and sleep  . Lesion of right native kidney 2017   found on CT 02/2015.  F/u MRI 03/31/15 showed it to be smaller and less likely of any significance but repeat CT renal protocol in 24mo was recommended by radiology.  This was done 10/26/15 and showed no interval change in the lesion, but b/c the lesion enhances with contrast, radiol suggested urol consultation (b/c papillary renal cell ca could not be excluded).  Saw urol 03/06/16--stable on u/s 09/2016  . Osteoarthritis, hip, bilateral    End stage; R THA  . Postmenopausal atrophic vaginitis    improved with estrace 2 X/week.  . Pulmonary nodule    CT chest 12/10, June 2011.  No nodule on CT 06/2010.  Marland Kitchen Recurrent UTI    likely related to pt's atrophic  vaginitis.  Urol started vaginal estrace 03/2016.  Marland Kitchen Shingles 02/2014   R abd/flank  . 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 Upper airway cough syndrome     Past Surgical History:  Procedure Laterality Date  . ABDOMINAL HYSTERECTOMY  1978   no BSO per pt--nonmalignant reasons  . APPENDECTOMY    . BREAST CYST EXCISION     Last screening mammogram 10/2010 was normal.  . CHOLECYSTECTOMY  2001  . COLONOSCOPY  04/2005; 12/06/2015   2007 NORMAL.  2017 Polyp x 1--path pending as of 12/2015.  Mod sigmoid diverticulosis (Dr. Earlean Shawl).    Marland Kitchen HIP CLOSED REDUCTION Right 12/25/2015   Procedure: CLOSED REDUCTION HIP;  Surgeon: Nicholes Stairs, MD;  Location: [facility];  Service: Orthopedics;  Laterality: Right;  . TONSILLECTOMY    . TOTAL HIP ARTHROPLASTY Right 11/01/2014   Procedure: RIGHT TOTAL HIP ARTHROPLASTY;  Surgeon: Latanya Maudlin, MD;  Location: [facility];  Service: Orthopedics;  Laterality: Right;  . TRANSTHORACIC ECHOCARDIOGRAM  08/2014   EF 60-65%, grade I diast dysfxn  . TUBAL LIGATION  1974    Outpatient Medications Prior to Visit  Medication Sig Dispense Refill  . acetaminophen (TYLENOL) 500 MG tablet Take 500 mg by mouth every 6 (six) hours as needed for mild pain, moderate pain, fever or headache.    . ADVAIR DISKUS 250-50 MCG/DOSE AEPB INHALE 1 PUFF INTO THE LUNGS TWICE DAILY 60 each 5  . albuterol (PROAIR HFA) 108 (90 Base) MCG/ACT inhaler Inhale 2 puffs into the lungs every 4 (four) hours as needed. 1 Inhaler 5  . albuterol (PROVENTIL) (2.5 MG/3ML) 0.083% nebulizer solution Take 3 mLs (2.5 mg total) by nebulization every 6 (six) hours as needed for wheezing or shortness of breath. 75 mL 5  . amLODipine (NORVASC) 5 MG tablet Take 1 tablet (5 mg total) by mouth daily. 90 tablet 3  . aspirin EC 81 MG tablet Take 81 mg by mouth daily.    . cetirizine (ZYRTEC) 10 MG tablet Take 1 tablet (10 mg total) by mouth daily. (Patient taking  differently: Take 10-20 mg by mouth daily as needed for allergies. ) 30 tablet 11  . DALIRESP 500 MCG TABS tablet TAKE 1 TABLET BY MOUTH EVERY DAY 90 tablet 0  . diazepam (VALIUM) 5 MG tablet TAKE 1 TABLET BY MOUTH EVERY MORNING AND 2 TABLETS BY MOUTH EVERY NIGHT AT BEDTIME 90 tablet 5  . Probiotic Product (PROBIOTIC DAILY PO) Take 2 capsules by mouth daily.     Marland Kitchen SPIRIVA HANDIHALER 18 MCG inhalation capsule INHALE 1 CAPSULE VIA HANDIHALER EVERY DAY AT THE SAME  TIME 30 capsule 0  . SYNTHROID 88 MCG tablet TAKE 1 TABLET BY MOUTH DAILY 30 tablet 0  . triamcinolone cream (KENALOG) 0.1 % Apply 1 application topically 2 (two) times daily. 30 g 0  . SPIRIVA HANDIHALER 18 MCG inhalation capsule INHALE 1 CAPSULE VIA HANDIHALER EVERY DAY AT THE SAME TIME 30 capsule 6  . venlafaxine (EFFEXOR) 50 MG tablet TAKE 1 TABLET(50 MG) BY MOUTH TWICE DAILY (Patient taking differently: TAKE 25-50 mg BY MOUTH twice a day) 60 tablet 11  . fluorometholone (FML) 0.1 % ophthalmic suspension SHAKE LQ AND INT 1 GTT IN OU TID  0  . polyvinyl alcohol-povidone (REFRESH) 1.4-0.6 % ophthalmic solution Place 1 drop into both eyes as needed (for drys eyes).     No facility-administered medications prior to visit.     Allergies  Allergen Reactions  . Losartan Other (See Comments)    hyperkalemia  . Penicillins Hives, Swelling and Rash    Has patient had a PCN reaction causing immediate rash, facial/tongue/throat swelling, SOB or lightheadedness with hypotension: YES Has patient had a PCN reaction causing severe rash involving mucus membranes or skin necrosis: NO Has patient had a PCN reaction that required hospitalization NO Has patient had a PCN reaction occurring within the last 10 years: NO If all of the above answers are "NO", then may proceed with Cephalosporin use.   . Cefixime [Kdc:Cefixime] Other (See Comments)    unspecified  . Indocin [Indomethacin] Other (See Comments)    Painful tongue and throat  . Sulfa  Antibiotics Other (See Comments)    unspecified  . Ace Inhibitors Other (See Comments)    cough  . Statins Other (See Comments)    Myalgias     ROS As per HPI  PE: Blood pressure 123/71, pulse 75, temperature (!) 97.3 F (36.3 C), temperature source Oral, resp. rate 16, height 5' (1.524 m), weight 143 lb 8 oz (65.1 kg), SpO2 92 %. Pt examined with Sharen Hones, CMA, as chaperone. Gen: Alert, well appearing.  Patient is oriented to person, place, time, and situation. AFFECT: pleasant but also a bit morose at times, lucid thought and speech. ATF:TDDU: no injection, icteris, swelling, or exudate.  EOMI, PERRLA. Mouth: lips without lesion/swelling.  Oral mucosa pink and moist. Oropharynx without erythema, exudate, or swelling.  Neck - No masses or thyromegaly or limitation in range of motion CV: RRR, no m/r/g.   LUNGS: CTA bilat, nonlabored resps, good aeration in all lung fields. ABD: soft, nondistended, BS normal, no HSM, no bruit, no mass.  She has mild TTP in lower abdomen diffusely, R>L.  No guarding or rebound tenderness.  LABS:    Chemistry      Component Value Date/Time   NA 141 02/27/2016 1533   K 3.9 02/27/2016 1533   CL 107 02/27/2016 1533   CO2 26 02/27/2016 1533   BUN 16 02/27/2016 1533   CREATININE 0.84 02/27/2016 1533      Component Value Date/Time   CALCIUM 9.6 02/27/2016 1533   ALKPHOS 66 07/01/2015 1730   AST 16 07/01/2015 1730   ALT 9 (L) 07/01/2015 1730   BILITOT 0.7 07/01/2015 1730     Lab Results  Component Value Date   WBC 10.0 07/04/2015   HGB 12.1 07/04/2015   HCT 37.2 07/04/2015   MCV 82.9 07/04/2015   PLT 356.0 07/04/2015   Lab Results  Component Value Date   TSH 1.87 02/27/2016   IMPRESSION AND PLAN:  1) Diarrhea: sx's sound  compatible with post-infectious IBS, but definitely want to get stool sample to check for C diff PCR (as well as bact clx, fecal lactoferrin, and giardia/crypto). No abx at this time. Start levsin 0.125, 1-2  tabs qid prn, #30, RF x 3. Continue to use otc imodium sparingly the way she is currently doing. Replace fluid losses.  Given her hx of refractory/recurrent c diff infection, we'll refer her to get established with a new GI MD since her current one, Dr. Earlean Shawl, is apparently not going to practice in the area anymore.  2) MDD, recurrent, mild episode (w/out psychotic features):  I encouraged her to continue taking venlafaxine 50mg  twice a day EVERY DAY. She'll continue a whole 5 mg valium hs to try to get consistently good rest.  An After Visit Summary was printed and given to the patient.  FOLLOW UP: Return for keep 11/18/16 appt already set.  Signed:  Crissie Sickles, MD           09/17/2016

## 2016-09-17 NOTE — Telephone Encounter (Signed)
Patient states that she has been having diarrhea 3-4 times daily and just yesterday she noticed really foul smell, some stomach pain.  Patient is taking probiotics every day now.

## 2016-09-17 NOTE — Telephone Encounter (Signed)
Tried calling pt NA and unable to leave a message.  

## 2016-09-17 NOTE — Telephone Encounter (Signed)
I just ordered a referral to [facility]. Is she currently having symptoms consistent with past episodes of C diff?

## 2016-09-18 DIAGNOSIS — Z8619 Personal history of other infectious and parasitic diseases: Secondary | ICD-10-CM | POA: Diagnosis not present

## 2016-09-18 DIAGNOSIS — R197 Diarrhea, unspecified: Secondary | ICD-10-CM | POA: Diagnosis not present

## 2016-09-18 NOTE — Addendum Note (Signed)
Addended by: Ralph Dowdy on: 09/18/2016 11:31 AM   Modules accepted: Orders

## 2016-09-19 ENCOUNTER — Telehealth: Payer: Self-pay

## 2016-09-19 ENCOUNTER — Other Ambulatory Visit: Payer: Self-pay | Admitting: Family Medicine

## 2016-09-19 ENCOUNTER — Encounter: Payer: Self-pay | Admitting: Family Medicine

## 2016-09-19 LAB — FECAL LACTOFERRIN, QUANT: Lactoferrin: POSITIVE

## 2016-09-19 LAB — CLOSTRIDIUM DIFFICILE BY PCR: Toxigenic C. Difficile by PCR: DETECTED — CR

## 2016-09-19 MED ORDER — METRONIDAZOLE 500 MG PO TABS: ORAL_TABLET | ORAL | 0 refills | Status: DC

## 2016-09-19 NOTE — Telephone Encounter (Signed)
Lab called with +(positive) C. Diff.

## 2016-09-22 LAB — STOOL CULTURE

## 2016-09-23 ENCOUNTER — Other Ambulatory Visit: Payer: Self-pay | Admitting: Family Medicine

## 2016-09-23 LAB — GIARDIA/CRYPTOSPORIDIUM (EIA)

## 2016-09-23 NOTE — Telephone Encounter (Signed)
[facility] [place] 

## 2016-09-29 ENCOUNTER — Ambulatory Visit (INDEPENDENT_AMBULATORY_CARE_PROVIDER_SITE_OTHER): Payer: Medicare Other | Admitting: Family Medicine

## 2016-09-29 ENCOUNTER — Encounter: Payer: Self-pay | Admitting: Family Medicine

## 2016-09-29 VITALS — BP 147/81 | HR 71 | Temp 97.8°F | Resp 16 | Ht 60.0 in | Wt 142.8 lb

## 2016-09-29 DIAGNOSIS — F330 Major depressive disorder, recurrent, mild: Secondary | ICD-10-CM | POA: Diagnosis not present

## 2016-09-29 DIAGNOSIS — R310 Gross hematuria: Secondary | ICD-10-CM | POA: Diagnosis not present

## 2016-09-29 DIAGNOSIS — R400 Somnolence: Secondary | ICD-10-CM

## 2016-09-29 DIAGNOSIS — R5382 Chronic fatigue, unspecified: Secondary | ICD-10-CM

## 2016-09-29 DIAGNOSIS — A0471 Enterocolitis due to Clostridium difficile, recurrent: Secondary | ICD-10-CM | POA: Diagnosis not present

## 2016-09-29 LAB — CBC WITH DIFFERENTIAL/PLATELET
Basophils Absolute: 0 10*3/uL (ref 0.0–0.1)
Basophils Relative: 0.6 % (ref 0.0–3.0)
Eosinophils Absolute: 0.1 10*3/uL (ref 0.0–0.7)
Eosinophils Relative: 1.6 % (ref 0.0–5.0)
HCT: 42.9 % (ref 36.0–46.0)
Hemoglobin: 14 g/dL (ref 12.0–15.0)
Lymphocytes Relative: 26.2 % (ref 12.0–46.0)
Lymphs Abs: 1.6 10*3/uL (ref 0.7–4.0)
MCHC: 32.6 g/dL (ref 30.0–36.0)
MCV: 88.6 fl (ref 78.0–100.0)
Monocytes Absolute: 0.5 10*3/uL (ref 0.1–1.0)
Monocytes Relative: 8.4 % (ref 3.0–12.0)
Neutro Abs: 3.9 10*3/uL (ref 1.4–7.7)
Neutrophils Relative %: 63.2 % (ref 43.0–77.0)
Platelets: 284 10*3/uL (ref 150.0–400.0)
RBC: 4.85 Mil/uL (ref 3.87–5.11)
RDW: 14.3 % (ref 11.5–15.5)
WBC: 6.2 10*3/uL (ref 4.0–10.5)

## 2016-09-29 LAB — POCT URINALYSIS DIPSTICK
Bilirubin, UA: NEGATIVE
Blood, UA: NEGATIVE
Glucose, UA: NEGATIVE
Ketones, UA: NEGATIVE
Nitrite, UA: NEGATIVE
Protein, UA: NEGATIVE
Spec Grav, UA: 1.015 (ref 1.010–1.025)
Urobilinogen, UA: 0.2 E.U./dL
pH, UA: 7 (ref 5.0–8.0)

## 2016-09-29 LAB — COMPREHENSIVE METABOLIC PANEL
ALT: 12 U/L (ref 0–35)
AST: 15 U/L (ref 0–37)
Albumin: 4.2 g/dL (ref 3.5–5.2)
Alkaline Phosphatase: 77 U/L (ref 39–117)
BUN: 14 mg/dL (ref 6–23)
CO2: 29 mEq/L (ref 19–32)
Calcium: 9.5 mg/dL (ref 8.4–10.5)
Chloride: 106 mEq/L (ref 96–112)
Creatinine, Ser: 0.72 mg/dL (ref 0.40–1.20)
GFR: 82.96 mL/min (ref >60.00)
Glucose, Bld: 86 mg/dL (ref 70–99)
Potassium: 4 mEq/L (ref 3.5–5.1)
Sodium: 141 mEq/L (ref 135–145)
Total Bilirubin: 0.6 mg/dL (ref 0.2–1.2)
Total Protein: 6.4 g/dL (ref 6.0–8.3)

## 2016-09-29 LAB — TSH: TSH: 1.65 u[IU]/mL (ref 0.35–4.50)

## 2016-09-29 NOTE — Progress Notes (Signed)
OFFICE VISIT  09/29/2016   CC:  Chief Complaint  Patient presents with  . Follow-up    C. Diff - didn't finish antibiotic  . Urinary Tract Infection    ?     HPI:    Patient is a 81 y.o. Caucasian female who presents for 12 f/u diarrhea.  She had stool study + for c diff PCR on 09/19/16 and we started her on metronidazole that day. Also started prn levsin.  She is taking probiotic daily.  Overall, from a GI standpoint she is improved. Energy level is down, though.  The "C diff smell" is gone, diarrhea frequency is less.  Still with mild lower abd pain, took levsin a couple times and it did help.  No fevers or n/v.  Says decreased urine output.  No foul odor, says she saw "orangy" urine color like she had when taking AZO in the past but she had not been taking any AZO recently.  No dysuria, incomplete emptying, or urinary urgency.  She recalls her bladder/cervical prolapse coming out into vagina worse once in the last few days--she pushed this back with her fingers w/out problem.  Drank water all day yesterday and today urine clear.   She stopped the flagyl yesterday b/c she didn't know what else to try.  Complains of excessive daytime somnolence.  Takes 5 mg valium hs and sleeps all night long and has some excessive grogginess.   This seems like a complaint that waxes and wanes.  If she is stimulated by doing something then she doesn't feel this grogginess at all. She does not snore or have hx of witnessed apneic spells in sleep. Mood is improved since increasing her venlafaxine up to 50mg  bid.  Past Medical History:  Diagnosis Date  . Asthma   . Chest pain, non-cardiac    Cardiac CT showed no signif obstructive dz, mildly elevated calcium level (Dr. [name]).  . Chronic cystitis with hematuria    [name]  . Chronic hypoxemic respiratory failure ([place]) 02/02/2015   2L oxygen 24/7  . COPD (chronic obstructive pulmonary disease) ([place])    spirometry 01/10/09 FEV  0.97(52%), FEV1% 47  . DDD (degenerative disc disease), cervical    1998 MRI C5-6 impingemt (left)  . DDD (degenerative disc disease), lumbar   . Depression with anxiety 04/15/2011  . Diarrheal disease Summer 2017   ? 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).  As of 10/05/15 pt reports GI dx'd her with C diff and rx'd more flagyl and she is also on cholestyramine.  As of 02/2016, pt's sx's resolved completely.  . Diverticulosis of colon    Procto '97 and colonoscopy 2002  . Dysplastic nevus of upper extremity 04/2014   R tricep ([name])  . Family history of adverse reaction to anesthesia    mother died when patient age 63 receiving anesthesia for thyroid surgery  . Fibrocystic breast disease    w/fibroadenoma.  Bx's showed NO atypia (Dr. Margot Chimes).  Mammo neg 2008.  Marland Kitchen GERD (gastroesophageal reflux disease)   . History of double vision    Ophthalmologist, Dr. Herbert Deaner, is further evaluating this with MRI  orbits and limited brain (myesthenia gravis testing neg 07/2010)  . History of home oxygen therapy    uses 2 liters at hs  . Hypertension    EKG 03/2010 normal  . Hypothyroidism    Hashimoto's, dx'd 1989  . Hypoxemia  O2 86% RA with exertion 01/10/09, 2 liters oxygen with exertion and sleep  . Lesion of right native kidney 2017   found on CT 02/2015.  F/u MRI 03/31/15 showed it to be smaller and less likely of any significance but repeat CT renal protocol in 61mo was recommended by radiology.  This was done 10/26/15 and showed no interval change in the lesion, but b/c the lesion enhances with contrast, radiol suggested urol consultation (b/c papillary renal cell ca could not be excluded).  Saw urol 03/06/16--stable on u/s 09/2016  . Osteoarthritis, hip, bilateral    End stage; R THA  . Postmenopausal atrophic vaginitis    improved with estrace 2 X/week.  . Pulmonary nodule    CT chest 12/10,  June 2011.  No nodule on CT 06/2010.  Marland Kitchen Recurrent Clostridium difficile diarrhea    Most recent episode 09/19/2016  . Recurrent UTI    likely related to pt's atrophic vaginitis.  Urol started vaginal estrace 03/2016.  Marland Kitchen Shingles 02/2014   R abd/flank  . 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 Upper airway cough syndrome     Past Surgical History:  Procedure Laterality Date  . ABDOMINAL HYSTERECTOMY  1978   no BSO per pt--nonmalignant reasons  . APPENDECTOMY    . BREAST CYST EXCISION     Last screening mammogram 10/2010 was normal.  . CHOLECYSTECTOMY  2001  . COLONOSCOPY  04/2005; 12/06/2015   2007 NORMAL.  2017 Polyp x 1--path pending as of 12/2015.  Mod sigmoid diverticulosis (Dr. Earlean Shawl).    Marland Kitchen HIP CLOSED REDUCTION Right 12/25/2015   Procedure: CLOSED REDUCTION HIP;  Surgeon: Nicholes Stairs, MD;  Location: [facility];  Service: Orthopedics;  Laterality: Right;  . TONSILLECTOMY    . TOTAL HIP ARTHROPLASTY Right 11/01/2014   Procedure: RIGHT TOTAL HIP ARTHROPLASTY;  Surgeon: Latanya Maudlin, MD;  Location: [facility];  Service: Orthopedics;  Laterality: Right;  . TRANSTHORACIC ECHOCARDIOGRAM  08/2014   EF 60-65%, grade I diast dysfxn  . TUBAL LIGATION  1974    Outpatient Medications Prior to Visit  Medication Sig Dispense Refill  . acetaminophen (TYLENOL) 500 MG tablet Take 500 mg by mouth every 6 (six) hours as needed for mild pain, moderate pain, fever or headache.    . ADVAIR DISKUS 250-50 MCG/DOSE AEPB INHALE 1 PUFF INTO THE LUNGS TWICE DAILY 60 each 5  . albuterol (PROAIR HFA) 108 (90 Base) MCG/ACT inhaler Inhale 2 puffs into the lungs every 4 (four) hours as needed. 1 Inhaler 5  . albuterol (PROVENTIL) (2.5 MG/3ML) 0.083% nebulizer solution Take 3 mLs (2.5 mg total) by nebulization every 6 (six) hours as needed for wheezing or shortness of breath. 75 mL 5  . amLODipine (NORVASC) 5 MG tablet Take 1 tablet (5 mg total) by mouth  daily. 90 tablet 3  . aspirin EC 81 MG tablet Take 81 mg by mouth daily.    . cetirizine (ZYRTEC) 10 MG tablet Take 1 tablet (10 mg total) by mouth daily. (Patient taking differently: Take 10-20 mg by mouth daily as needed for allergies. ) 30 tablet 11  . DALIRESP 500 MCG TABS tablet TAKE 1 TABLET BY MOUTH EVERY DAY 90 tablet 0  . diazepam (VALIUM) 5 MG tablet TAKE 1 TABLET BY MOUTH EVERY MORNING AND 2 TABLETS BY MOUTH EVERY NIGHT AT BEDTIME 90 tablet 5  . hyoscyamine (LEVSIN SL) 0.125 MG SL tablet 1-2 tabs po q6h  prn lower abd pain and urgent loose stools 30 tablet 3  . Lifitegrast (XIIDRA) 5 % SOLN Place 1 drop into both eyes 2 (two) times daily.    . Probiotic Product (PROBIOTIC DAILY PO) Take 2 capsules by mouth daily.     Marland Kitchen SPIRIVA HANDIHALER 18 MCG inhalation capsule INHALE 1 CAPSULE VIA HANDIHALER EVERY DAY AT THE SAME TIME 30 capsule 0  . SYNTHROID 88 MCG tablet TAKE 1 TABLET BY MOUTH DAILY 30 tablet 5  . Tetrahydrozoline HCl (EYE DROPS OP) Apply to eye. SootheXP    . triamcinolone cream (KENALOG) 0.1 % Apply 1 application topically 2 (two) times daily. 30 g 0  . venlafaxine (EFFEXOR) 50 MG tablet TAKE 1 TABLET(50 MG) BY MOUTH TWICE DAILY 180 tablet 3  . metroNIDAZOLE (FLAGYL) 500 MG tablet 1 tab po tid x 14d (Patient not taking: Reported on 09/29/2016) 42 tablet 0   No facility-administered medications prior to visit.     Allergies  Allergen Reactions  . Losartan Other (See Comments)    hyperkalemia  . Penicillins Hives, Swelling and Rash    Has patient had a PCN reaction causing immediate rash, facial/tongue/throat swelling, SOB or lightheadedness with hypotension: YES Has patient had a PCN reaction causing severe rash involving mucus membranes or skin necrosis: NO Has patient had a PCN reaction that required hospitalization NO Has patient had a PCN reaction occurring within the last 10 years: NO If all of the above answers are "NO", then may proceed with Cephalosporin use.   .  Cefixime [Kdc:Cefixime] Other (See Comments)    unspecified  . Indocin [Indomethacin] Other (See Comments)    Painful tongue and throat  . Sulfa Antibiotics Other (See Comments)    unspecified  . Ace Inhibitors Other (See Comments)    cough  . Statins Other (See Comments)    Myalgias     ROS As per HPI  PE: Blood pressure (!) 147/81, pulse 71, temperature 97.8 F (36.6 C), temperature source Oral, resp. rate 16, height 5' (1.524 m), weight 142 lb 12 oz (64.8 kg), SpO2 95 %. Gen: Alert, well appearing.  Patient is oriented to person, place, time, and situation. AFFECT: pleasant, lucid thought and speech. No further exam today.  LABS:  CC UA today: PT UNABLE TO GIVE SPECIMEN.    Chemistry      Component Value Date/Time   NA 141 02/27/2016 1533   K 3.9 02/27/2016 1533   CL 107 02/27/2016 1533   CO2 26 02/27/2016 1533   BUN 16 02/27/2016 1533   CREATININE 0.84 02/27/2016 1533      Component Value Date/Time   CALCIUM 9.6 02/27/2016 1533   ALKPHOS 66 07/01/2015 1730   AST 16 07/01/2015 1730   ALT 9 (L) 07/01/2015 1730   BILITOT 0.7 07/01/2015 1730     Lab Results  Component Value Date   WBC 10.0 07/04/2015   HGB 12.1 07/04/2015   HCT 37.2 07/04/2015   MCV 82.9 07/04/2015   PLT 356.0 07/04/2015   Lab Results  Component Value Date   FERRITIN 68.9 12/06/2014   Lab Results  Component Value Date   TSH 1.87 02/27/2016   IMPRESSION AND PLAN:  1) Recurrent c diff colitis; recent episode responding to metronidazole. Encouraged pt to restart this medication b/c I don't think it had anything to do with her urine complaints. Continue efforts at good hydration, continue probiotic, continue levsin prn, continue diet w/out dairy and w/out concentrated sugars. We have recently ordered  a referral to a new GI MD for her to get established, since her prior one is not practicing in the area anymore. Check lytes/cr today.  2) Chronic fatigue, with excessive daytime  somnolence.   TAke valium a little earlier in evening to try to decrease morning "grogginess" effect.  Keep busy during daytime to avoid situation that could lead to boredom/sleep.  Will check CBC, TSH, and CMET today.  3) Question of gross hematuria, with decreased urine output.  This was likely concentrated urine due to being dehydrated. This resolved with increased water intake. UA today: PT UNABLE TO GIVE SPECIMEN.  Encouraged her to return to office in the next 1-2 to give urine sample (lab visit).  4) Depression: improved with taking 50mg  venlafaxine regularly.  An After Visit Summary was printed and given to the patient.  FOLLOW UP: Return in about 2 weeks (around 10/13/2016) for f/u c diff and fatigue/somnolence.  Signed:  Crissie Sickles, MD           09/29/2016

## 2016-09-29 NOTE — Addendum Note (Signed)
Addended by: Gordy Councilman on: 09/29/2016 01:30 PM   Modules accepted: Orders

## 2016-09-30 LAB — URINE CULTURE: Organism ID, Bacteria: NO GROWTH

## 2016-10-10 NOTE — Progress Notes (Signed)
Subjective:   Priscilla Cantrell is a 81 y.o. female who presents for Medicare Annual (Subsequent) preventive examination.  Review of Systems:  No ROS.  Medicare Wellness Visit. Additional risk factors are reflected in the social history.  Cardiac Risk Factors include: advanced age (>58men, >63 women);hypertension;family history of premature cardiovascular disease   Sleep patterns: Sleeps 7-8 hours, uses Oxygen 2L via [place] at night.  Home Safety/Smoke Alarms: Feels safe in home. Smoke alarms in place.  Living environment; residence and Firearm Safety: Lives alone in 2 story home.  Seat Belt Safety/Bike Helmet: Wears seat belt.   Female:   Pap-N/A     Mammo-05/19/2016, negative.       Dexa scan-09/18/2015 [facility]-Colonoscopy 12/06/2015, polyps.      Objective:     Vitals: BP 110/64 (BP Location: Left Arm, Patient Position: Sitting, Cuff Size: Normal)   Pulse 77   Resp 18   Ht 5' (1.524 m)   Wt 144 lb 12.8 oz (65.7 kg)   SpO2 96%   BMI 28.28 kg/m   Body mass index is 28.28 kg/m.   Tobacco History  Smoking Status  . Former Smoker  . Packs/day: 2.00  . Years: 35.00  . Types: Cigarettes  . Quit date: 02/03/1990  Smokeless Tobacco  . Never Used     Counseling given: Not Answered   Past Medical History:  Diagnosis Date  . Asthma   . Chest pain, non-cardiac    Cardiac CT showed no signif obstructive dz, mildly elevated calcium level (Dr. [name]).  . Chronic cystitis with hematuria    [name]  . Chronic hypoxemic respiratory failure ([place]) 02/02/2015   2L oxygen 24/7  . COPD (chronic obstructive pulmonary disease) ([place])    spirometry 01/10/09 FEV 0.97(52%), FEV1% 47  . DDD (degenerative disc disease), cervical    1998 MRI C5-6 impingemt (left)  . DDD (degenerative disc disease), lumbar   . Depression with anxiety 04/15/2011  . Diarrheal disease Summer 2017   ? 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).  As of 10/05/15 pt reports GI dx'd her with C diff and rx'd more flagyl and she is also on cholestyramine.  As of 02/2016, pt's sx's resolved completely.  . Diverticulosis of colon    Procto '97 and colonoscopy 2002  . Dysplastic nevus of upper extremity 04/2014   R tricep ([name])  . Family history of adverse reaction to anesthesia    mother died when patient age 39 receiving anesthesia for thyroid surgery  . Fibrocystic breast disease    w/fibroadenoma.  Bx's showed NO atypia (Dr. Margot Chimes).  Mammo neg 2008.  Marland Kitchen GERD (gastroesophageal reflux disease)   . History of double vision    Ophthalmologist, Dr. Herbert Deaner, is further evaluating this with MRI  orbits and limited brain (myesthenia gravis testing neg 07/2010)  . History of home oxygen therapy    uses 2 liters at hs  . Hypertension    EKG 03/2010 normal  . Hypothyroidism    Hashimoto's, dx'd 1989  . Hypoxemia    O2 86% RA with exertion 01/10/09, 2 liters oxygen with exertion and sleep  . Lesion of right native kidney 2017   found on CT 02/2015.  F/u MRI 03/31/15 showed it to be smaller and less likely of any significance but repeat CT renal protocol in 14mo was recommended by radiology.  This was done 10/26/15 and showed no interval  change in the lesion, but b/c the lesion enhances with contrast, radiol suggested urol consultation (b/c papillary renal cell ca could not be excluded).  Saw urol 03/06/16--stable on u/s 09/2016  . Osteoarthritis, hip, bilateral    End stage; R THA  . Postmenopausal atrophic vaginitis    improved with estrace 2 X/week.  . Pulmonary nodule    CT chest 12/10, June 2011.  No nodule on CT 06/2010.  Marland Kitchen Recurrent Clostridium difficile diarrhea    Most recent episode 09/19/2016  . Recurrent UTI    likely related to pt's atrophic vaginitis.  Urol started vaginal estrace 03/2016.  Marland Kitchen Shingles 02/2014   R abd/flank  . Spinal stenosis of lumbar region with neurogenic  claudication    2008 MRI (L3-4, L4-5, L5-S1)  . Upper airway cough syndrome    Past Surgical History:  Procedure Laterality Date  . ABDOMINAL HYSTERECTOMY  1978   no BSO per pt--nonmalignant reasons  . APPENDECTOMY    . BREAST CYST EXCISION     Last screening mammogram 10/2010 was normal.  . CHOLECYSTECTOMY  2001  . COLONOSCOPY  04/2005; 12/06/2015   2007 NORMAL.  2017 Polyp x 1--path pending as of 12/2015.  Mod sigmoid diverticulosis (Dr. Earlean Shawl).    Marland Kitchen HIP CLOSED REDUCTION Right 12/25/2015   Procedure: CLOSED REDUCTION HIP;  Surgeon: Nicholes Stairs, MD;  Location: [facility];  Service: Orthopedics;  Laterality: Right;  . TONSILLECTOMY    . TOTAL HIP ARTHROPLASTY Right 11/01/2014   Procedure: RIGHT TOTAL HIP ARTHROPLASTY;  Surgeon: Latanya Maudlin, MD;  Location: [facility];  Service: Orthopedics;  Laterality: Right;  . TRANSTHORACIC ECHOCARDIOGRAM  08/2014   EF 60-65%, grade I diast dysfxn  . TUBAL LIGATION  1974   Family History  Problem Relation Age of Onset  . Goiter Mother   . Psoriasis Father        od liver  . Cirrhosis Father   . Diabetes Daughter        Type 2  . Cancer Paternal Aunt        stomach?  . Stroke Maternal Grandfather   . Stroke Paternal Grandmother   . Hypertension Paternal Grandmother   . Hernia Paternal Grandmother   . Cancer Paternal Grandfather        lung   History  Sexual Activity  . Sexual activity: No    Outpatient Encounter Prescriptions as of 10/13/2016  Medication Sig  . acetaminophen (TYLENOL) 500 MG tablet Take 500 mg by mouth every 6 (six) hours as needed for mild pain, moderate pain, fever or headache.  . ADVAIR DISKUS 250-50 MCG/DOSE AEPB INHALE 1 PUFF INTO THE LUNGS TWICE DAILY  . albuterol (PROAIR HFA) 108 (90 Base) MCG/ACT inhaler Inhale 2 puffs into the lungs every 4 (four) hours as needed.  Marland Kitchen albuterol (PROVENTIL) (2.5 MG/3ML) 0.083% nebulizer solution Take 3 mLs (2.5 mg total) by nebulization every 6 (six) hours as needed for  wheezing or shortness of breath.  Marland Kitchen amLODipine (NORVASC) 5 MG tablet Take 1 tablet (5 mg total) by mouth daily.  Marland Kitchen aspirin EC 81 MG tablet Take 81 mg by mouth daily.  . cetirizine (ZYRTEC) 10 MG tablet Take 1 tablet (10 mg total) by mouth daily. (Patient taking differently: Take 10-20 mg by mouth daily as needed for allergies. )  . DALIRESP 500 MCG TABS tablet TAKE 1 TABLET BY MOUTH EVERY DAY  . diazepam (VALIUM) 5 MG tablet TAKE 1 TABLET BY MOUTH EVERY MORNING AND 2 TABLETS BY  MOUTH EVERY NIGHT AT BEDTIME (Patient taking differently: nightly)  . hyoscyamine (LEVSIN SL) 0.125 MG SL tablet 1-2 tabs po q6h prn lower abd pain and urgent loose stools  . Lifitegrast (XIIDRA) 5 % SOLN Place 1 drop into both eyes 2 (two) times daily.  . Probiotic Product (PROBIOTIC DAILY PO) Take 2 capsules by mouth daily.   Marland Kitchen SPIRIVA HANDIHALER 18 MCG inhalation capsule INHALE 1 CAPSULE VIA HANDIHALER EVERY DAY AT THE SAME TIME  . SYNTHROID 88 MCG tablet TAKE 1 TABLET BY MOUTH DAILY  . Tetrahydrozoline HCl (EYE DROPS OP) Apply to eye. SootheXP  . triamcinolone cream (KENALOG) 0.1 % Apply 1 application topically 2 (two) times daily.  Marland Kitchen venlafaxine (EFFEXOR) 50 MG tablet TAKE 1 TABLET(50 MG) BY MOUTH TWICE DAILY   No facility-administered encounter medications on file as of 10/13/2016.     Activities of Daily Living In your present state of health, do you have any difficulty performing the following activities: 10/13/2016 12/25/2015  Hearing? [place]? N -  Difficulty concentrating or making decisions? N -  Walking or climbing stairs? Y -  Comment hip pain; walks with cane -  Dressing or bathing? N -  Doing errands, shopping? N N  Preparing Food and eating ? N -  Using the Toilet? N -  In the past six months, have you accidently leaked urine? N -  Do you have problems with loss of bowel control? N -  Managing your Medications? N -  Managing your Finances? N -  Housekeeping or managing your Housekeeping? N -   Some recent data might be hidden    Patient Care Team: [name], MD as PCP - General [name], Denice Bors, MD as Consulting Physician (Cardiology) Monna Fam, MD as Consulting Physician (Ophthalmology) Chesley Mires, MD as Consulting Physician (Pulmonary Disease) Latanya Maudlin, MD as Consulting Physician (Orthopedic Surgery) Alyson Ingles [name], MD as Consulting Physician (Urology)    Assessment:    Physical assessment deferred to PCP.  Exercise Activities and Dietary recommendations Current Exercise Habits: The patient does not participate in regular exercise at present, Exercise limited by: orthopedic condition(s);Other - see comments   Diet (meal preparation, eat out, water intake, caffeinated beverages, dairy products, fruits and vegetables): Drinks water, tea and diet gingerale.   Breakfast: cereal, fruit, almond milk; sausage, egg, toast, coffee Lunch: skips occasionally; fast food take out Dinner: protein and vegetables.   Goals      Patient Stated   . patient (pt-stated)          Maintain current health by staying active.       Fall Risk Fall Risk  10/13/2016 09/18/2015 09/06/2014  Falls in the past year? Yes No Yes  Number falls in past yr: 1 - 1  Injury with Fall? No - Yes  Comment - - left knee pain  Risk for fall due to : Impaired balance/gait - -  Follow up Falls prevention discussed - -   Depression Screen PHQ 2/9 Scores 10/13/2016 09/18/2015 09/06/2014  PHQ - 2 Score 0 1 0     Cognitive Function       Ad8 score reviewed for issues:  Issues making decisions: no  Less interest in hobbies / activities: no  Repeats questions, stories (family complaining): no  Trouble using ordinary gadgets (microwave, computer, phone): no  Forgets the month or year: no  Mismanaging finances: no  Remembering appts: no  Daily problems with thinking and/or memory: no Ad8 score is=0  Immunization History  Administered Date(s) Administered  .  Influenza Split 11/04/2010, 10/23/2011  . Influenza Whole 10/04/2009  . Influenza,inj,Quad PF,6+ Mos 10/07/2012, 10/11/2014  . Influenza-Unspecified 11/07/2013, 11/01/2015  . Pneumococcal Conjugate-13 04/29/2013  . Pneumococcal Polysaccharide-23 02/03/2005, 11/28/2010  . Td 02/04/2008, 03/07/2010  . Zoster 02/10/2013   Screening Tests Health Maintenance  Topic Date Due  . INFLUENZA VACCINE  09/03/2016  . MAMMOGRAM  05/19/2017  . TETANUS/TDAP  03/07/2020  . DEXA SCAN  Completed  . PNA vac Low Risk Adult  Completed   Plans to have Influ[facility] at [facility].   Postponing Shingrix until after C-Diff is cleared.      Plan:    Bring a copy of your living will and/or healthcare power of attorney to your next office visit.  Continue doing brain stimulating activities (puzzles, reading, adult coloring books, staying active) to keep memory sharp.     I have personally reviewed and noted the following in the patient's chart:   . Medical and social history . Use of alcohol, tobacco or illicit drugs  . Current medications and supplements . Functional ability and status . Nutritional status . Physical activity . Advanced directives . List of other physicians . Hospitalizations, surgeries, and ER visits in previous 12 months . Vitals . Screenings to include cognitive, depression, and falls . Referrals and appointments  In addition, I have reviewed and discussed with patient certain preventive protocols, quality metrics, and best practice recommendations. A written personalized care plan for preventive services as well as general preventive health recommendations were provided to patient.     Gerilyn Nestle, RN  10/13/2016

## 2016-10-13 ENCOUNTER — Ambulatory Visit: Payer: Medicare Other

## 2016-10-13 ENCOUNTER — Ambulatory Visit (INDEPENDENT_AMBULATORY_CARE_PROVIDER_SITE_OTHER): Payer: Medicare Other | Admitting: Family Medicine

## 2016-10-13 ENCOUNTER — Encounter: Payer: Self-pay | Admitting: Family Medicine

## 2016-10-13 VITALS — BP 110/64 | HR 77 | Temp 98.2°F | Resp 18 | Ht 60.0 in | Wt 144.8 lb

## 2016-10-13 DIAGNOSIS — Z0000 Encounter for general adult medical examination without abnormal findings: Secondary | ICD-10-CM

## 2016-10-13 DIAGNOSIS — F32A Depression, unspecified: Secondary | ICD-10-CM

## 2016-10-13 DIAGNOSIS — F419 Anxiety disorder, unspecified: Secondary | ICD-10-CM

## 2016-10-13 DIAGNOSIS — A0472 Enterocolitis due to Clostridium difficile, not specified as recurrent: Secondary | ICD-10-CM

## 2016-10-13 DIAGNOSIS — F329 Major depressive disorder, single episode, unspecified: Secondary | ICD-10-CM | POA: Diagnosis not present

## 2016-10-13 DIAGNOSIS — R400 Somnolence: Secondary | ICD-10-CM | POA: Diagnosis not present

## 2016-10-13 MED ORDER — METRONIDAZOLE 500 MG PO TABS: 500.0000 mg | ORAL_TABLET | Freq: Three times a day (TID) | ORAL | 0 refills | Status: AC

## 2016-10-13 NOTE — Patient Instructions (Signed)
Bring a copy of your living will and/or healthcare power of attorney to your next office visit.  Continue doing brain stimulating activities (puzzles, reading, adult coloring books, staying active) to keep memory sharp.    Health Maintenance, Female Adopting a healthy lifestyle and getting preventive care can go a long way to promote health and wellness. Talk with your health care provider about what schedule of regular examinations is right for you. This is a good chance for you to check in with your provider about disease prevention and staying healthy. In between checkups, there are plenty of things you can do on your own. Experts have done a lot of research about which lifestyle changes and preventive measures are most likely to keep you healthy. Ask your health care provider for more information. Weight and diet Eat a healthy diet  Be sure to include plenty of vegetables, fruits, low-fat dairy products, and lean protein.  Do not eat a lot of foods high in solid fats, added sugars, or salt.  Get regular exercise. This is one of the most important things you can do for your health. ? Most adults should exercise for at least 150 minutes each week. The exercise should increase your heart rate and make you sweat (moderate-intensity exercise). ? Most adults should also do strengthening exercises at least twice a week. This is in addition to the moderate-intensity exercise.  Maintain a healthy weight  Body mass index (BMI) is a measurement that can be used to identify possible weight problems. It estimates body fat based on height and weight. Your health care provider can help determine your BMI and help you achieve or maintain a healthy weight.  For females 62 years of age and older: ? A BMI below 18.5 is considered underweight. ? A BMI of 18.5 to 24.9 is normal. ? A BMI of 25 to 29.9 is considered overweight. ? A BMI of 30 and above is considered obese.  Watch levels of cholesterol and  blood lipids  You should start having your blood tested for lipids and cholesterol at 79 years of age, then have this test every 5 years.  You may need to have your cholesterol levels checked more often if: ? Your lipid or cholesterol levels are high. ? You are older than 79 years of age. ? You are at high risk for heart disease.  Cancer screening Lung Cancer  Lung cancer screening is recommended for adults 87-17 years old who are at high risk for lung cancer because of a history of smoking.  A yearly low-dose CT scan of the lungs is recommended for people who: ? Currently smoke. ? Have quit within the past 15 years. ? Have at least a 30-pack-year history of smoking. A pack year is smoking an average of one pack of cigarettes a day for 1 year.  Yearly screening should continue until it has been 15 years since you quit.  Yearly screening should stop if you develop a health problem that would prevent you from having lung cancer treatment.  Breast Cancer  Practice breast self-awareness. This means understanding how your breasts normally appear and feel.  It also means doing regular breast self-exams. Let your health care provider know about any changes, no matter how small.  If you are in your 20s or 30s, you should have a clinical breast exam (CBE) by a health care provider every 1-3 years as part of a regular health exam.  If you are 38 or older, have a CBE  year. Also consider having a breast X-ray (mammogram) every year.  If you have a family history of breast cancer, talk to your health care provider about genetic screening.  If you are at high risk for breast cancer, talk to your health care provider about having an MRI and a mammogram every year.  Breast cancer gene (BRCA) assessment is recommended for women who have family members with BRCA-related cancers. BRCA-related cancers include: ? Breast. ? Ovarian. ? Tubal. ? Peritoneal cancers.  Results of the assessment  will determine the need for genetic counseling and BRCA1 and BRCA2 testing.  Cervical Cancer Your health care provider may recommend that you be screened regularly for cancer of the pelvic organs (ovaries, uterus, and vagina). This screening involves a pelvic examination, including checking for microscopic changes to the surface of your cervix (Pap test). You may be encouraged to have this screening done every 3 years, beginning at age 21.  For women ages 30-65, health care providers may recommend pelvic exams and Pap testing every 3 years, or they may recommend the Pap and pelvic exam, combined with testing for human papilloma virus (HPV), every 5 years. Some types of HPV increase your risk of cervical cancer. Testing for HPV may also be done on women of any age with unclear Pap test results.  Other health care providers may not recommend any screening for nonpregnant women who are considered low risk for pelvic cancer and who do not have symptoms. Ask your health care provider if a screening pelvic exam is right for you.  If you have had past treatment for cervical cancer or a condition that could lead to cancer, you need Pap tests and screening for cancer for at least 20 years after your treatment. If Pap tests have been discontinued, your risk factors (such as having a new sexual partner) need to be reassessed to determine if screening should resume. Some women have medical problems that increase the chance of getting cervical cancer. In these cases, your health care provider may recommend more frequent screening and Pap tests.  Colorectal Cancer  This type of cancer can be detected and often prevented.  Routine colorectal cancer screening usually begins at 79 years of age and continues through 79 years of age.  Your health care provider may recommend screening at an earlier age if you have risk factors for colon cancer.  Your health care provider may also recommend using home test kits to  check for hidden blood in the stool.  A small camera at the end of a tube can be used to examine your colon directly (sigmoidoscopy or colonoscopy). This is done to check for the earliest forms of colorectal cancer.  Routine screening usually begins at age 50.  Direct examination of the colon should be repeated every 5-10 years through 79 years of age. However, you may need to be screened more often if early forms of precancerous polyps or small growths are found.  Skin Cancer  Check your skin from head to toe regularly.  Tell your health care provider about any new moles or changes in moles, especially if there is a change in a mole's shape or color.  Also tell your health care provider if you have a mole that is larger than the size of a pencil eraser.  Always use sunscreen. Apply sunscreen liberally and repeatedly throughout the day.  Protect yourself by wearing long sleeves, pants, a wide-brimmed hat, and sunglasses whenever you are outside.  Heart disease, diabetes, and   and high blood pressure  High blood pressure causes heart disease and increases the risk of stroke. High blood pressure is more likely to develop in: ? People who have blood pressure in the high end of the normal range (130-139/85-89 mm Hg). ? People who are overweight or obese. ? People who are African American.  If you are 29-55 years of age, have your blood pressure checked every 3-5 years. If you are 61 years of age or older, have your blood pressure checked every year. You should have your blood pressure measured twice-once when you are at a hospital or clinic, and once when you are not at a hospital or clinic. Record the average of the two measurements. To check your blood pressure when you are not at a hospital or clinic, you can use: ? An automated blood pressure machine at a pharmacy. ? A home blood pressure monitor.  If you are between 65 years and 14 years old, ask your health care provider if you should  take aspirin to prevent strokes.  Have regular diabetes screenings. This involves taking a blood sample to check your fasting blood sugar level. ? If you are at a normal weight and have a low risk for diabetes, have this test once every three years after 79 years of age. ? If you are overweight and have a high risk for diabetes, consider being tested at a younger age or more often. Preventing infection Hepatitis B  If you have a higher risk for hepatitis B, you should be screened for this virus. You are considered at high risk for hepatitis B if: ? You were born in a country where hepatitis B is common. Ask your health care provider which countries are considered high risk. ? Your parents were born in a high-risk country, and you have not been immunized against hepatitis B (hepatitis B vaccine). ? You have HIV or AIDS. ? You use needles to inject street drugs. ? You live with someone who has hepatitis B. ? You have had sex with someone who has hepatitis B. ? You get hemodialysis treatment. ? You take certain medicines for conditions, including cancer, organ transplantation, and autoimmune conditions.  Hepatitis C  Blood testing is recommended for: ? Everyone born from [birth year] through 1963. ? Anyone with known risk factors for hepatitis C.  Sexually transmitted infections (STIs)  You should be screened for sexually transmitted infections (STIs) including gonorrhea and chlamydia if: ? You are sexually active and are younger than 79 years of age. ? You are older than 79 years of age and your health care provider tells you that you are at risk for this type of infection. ? Your sexual activity has changed since you were last screened and you are at an increased risk for chlamydia or gonorrhea. Ask your health care provider if you are at risk.  If you do not have HIV, but are at risk, it may be recommended that you take a prescription medicine daily to prevent HIV infection. This is called  pre-exposure prophylaxis (PrEP). You are considered at risk if: ? You are sexually active and do not regularly use condoms or know the HIV status of your partner(s). ? You take drugs by injection. ? You are sexually active with a partner who has HIV.  Talk with your health care provider about whether you are at high risk of being infected with HIV. If you choose to begin PrEP, you should first be tested for HIV. You should then  be tested every 3 months for as long as you are taking PrEP. Pregnancy  If you are premenopausal and you may become pregnant, ask your health care provider about preconception counseling.  If you may become pregnant, take 400 to 800 micrograms (mcg) of folic acid every day.  If you want to prevent pregnancy, talk to your health care provider about birth control (contraception). Osteoporosis and menopause  Osteoporosis is a disease in which the bones lose minerals and strength with aging. This can result in serious bone fractures. Your risk for osteoporosis can be identified using a bone density scan.  If you are 40 years of age or older, or if you are at risk for osteoporosis and fractures, ask your health care provider if you should be screened.  Ask your health care provider whether you should take a calcium or vitamin D supplement to lower your risk for osteoporosis.  Menopause may have certain physical symptoms and risks.  Hormone replacement therapy may reduce some of these symptoms and risks. Talk to your health care provider about whether hormone replacement therapy is right for you. Follow these instructions at home:  Schedule regular health, dental, and eye exams.  Stay current with your immunizations.  Do not use any tobacco products including cigarettes, chewing tobacco, or electronic cigarettes.  If you are pregnant, do not drink alcohol.  If you are breastfeeding, limit how much and how often you drink alcohol.  Limit alcohol intake to no more  than 1 drink per day for nonpregnant women. One drink equals 12 ounces of beer, 5 ounces of wine, or 1 ounces of hard liquor.  Do not use street drugs.  Do not share needles.  Ask your health care provider for help if you need support or information about quitting drugs.  Tell your health care provider if you often feel depressed.  Tell your health care provider if you have ever been abused or do not feel safe at home. This information is not intended to replace advice given to you by your health care provider. Make sure you discuss any questions you have with your health care provider. Document Released: 08/05/2010 Document Revised: 06/28/2015 Document Reviewed: 10/24/2014 Elsevier Interactive Patient Education  [employer].

## 2016-10-13 NOTE — Progress Notes (Signed)
OFFICE VISIT  10/13/2016   CC:  Chief Complaint  Patient presents with  . Medicare Wellness   HPI:    Patient is a 81 y.o. Caucasian female who presents for 2 week f/u recurrent C diff + chronic fatigue/somnolence. She finished the flagyl a few days ago. Still has a big loose stool every morning.  Also on some days has smaller loose bm or two later in the day.  The stool urgency is much improved.  Stool is not all water.  No blood or pus in stool.  No rectal/anal pain.  No BRBPR. Trying to stay well hydrated.  She is avoiding dairy and simple sugars.  She made an appt with Dr. Moreen Fowler found out where he is practicing now--she has appt 11/03/16 with him.  [facility] bought out his practice but he still works in [place].  No n/v.  No signif abd pain.  Appetite is fine.  Energy level is up and down, as per her usual.  Mood is pretty stable, better since increasing her venlafaxine to 50mg  bid. Daytime somnolence not as bad since moving her evening dose of valium to earlier in the evening.  Past Medical History:  Diagnosis Date  . Asthma   . Chest pain, non-cardiac    Cardiac CT showed no signif obstructive dz, mildly elevated calcium level (Dr. [name]).  . Chronic cystitis with hematuria    [name]  . Chronic hypoxemic respiratory failure ([place]) 02/02/2015   2L oxygen 24/7  . COPD (chronic obstructive pulmonary disease) ([place])    spirometry 01/10/09 FEV 0.97(52%), FEV1% 47  . DDD (degenerative disc disease), cervical    1998 MRI C5-6 impingemt (left)  . DDD (degenerative disc disease), lumbar   . Depression with anxiety 04/15/2011  . Diarrheal disease Summer 2017   ? 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).  As of 10/05/15 pt reports GI dx'd her with C diff and rx'd more flagyl and she is also on cholestyramine.  As of 02/2016, pt's sx's resolved completely.  .  Diverticulosis of colon    Procto '97 and colonoscopy 2002  . Dysplastic nevus of upper extremity 04/2014   R tricep ([name])  . Family history of adverse reaction to anesthesia    mother died when patient age 65 receiving anesthesia for thyroid surgery  . Fibrocystic breast disease    w/fibroadenoma.  Bx's showed NO atypia (Dr. Margot Chimes).  Mammo neg 2008.  Marland Kitchen GERD (gastroesophageal reflux disease)   . History of double vision    Ophthalmologist, Dr. Herbert Deaner, is further evaluating this with MRI  orbits and limited brain (myesthenia gravis testing neg 07/2010)  . History of home oxygen therapy    uses 2 liters at hs  . Hypertension    EKG 03/2010 normal  . Hypothyroidism    Hashimoto's, dx'd 1989  . Hypoxemia    O2 86% RA with exertion 01/10/09, 2 liters oxygen with exertion and sleep  . Lesion of right native kidney 2017   found on CT 02/2015.  F/u MRI 03/31/15 showed it to be smaller and less likely of any significance but repeat CT renal protocol in 23mo was recommended by radiology.  This was done 10/26/15 and showed no interval change in the lesion, but b/c the lesion enhances with contrast, radiol suggested urol consultation (b/c papillary renal cell ca could not be excluded).  Saw urol 03/06/16--stable on u/s 09/2016  .  Osteoarthritis, hip, bilateral    End stage; R THA  . Postmenopausal atrophic vaginitis    improved with estrace 2 X/week.  . Pulmonary nodule    CT chest 12/10, June 2011.  No nodule on CT 06/2010.  Marland Kitchen Recurrent Clostridium difficile diarrhea    Most recent episode 09/19/2016  . Recurrent UTI    likely related to pt's atrophic vaginitis.  Urol started vaginal estrace 03/2016.  Marland Kitchen Shingles 02/2014   R abd/flank  . 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 Upper airway cough syndrome     Past Surgical History:  Procedure Laterality Date  . ABDOMINAL HYSTERECTOMY  1978   no BSO per pt--nonmalignant reasons  .  APPENDECTOMY    . BREAST CYST EXCISION     Last screening mammogram 10/2010 was normal.  . CHOLECYSTECTOMY  2001  . COLONOSCOPY  04/2005; 12/06/2015   2007 NORMAL.  2017 Polyp x 1--path pending as of 12/2015.  Mod sigmoid diverticulosis (Dr. Earlean Shawl).    Marland Kitchen HIP CLOSED REDUCTION Right 12/25/2015   Procedure: CLOSED REDUCTION HIP;  Surgeon: Nicholes Stairs, MD;  Location: [facility];  Service: Orthopedics;  Laterality: Right;  . TONSILLECTOMY    . TOTAL HIP ARTHROPLASTY Right 11/01/2014   Procedure: RIGHT TOTAL HIP ARTHROPLASTY;  Surgeon: Latanya Maudlin, MD;  Location: [facility];  Service: Orthopedics;  Laterality: Right;  . TRANSTHORACIC ECHOCARDIOGRAM  08/2014   EF 60-65%, grade I diast dysfxn  . TUBAL LIGATION  1974    Outpatient Medications Prior to Visit  Medication Sig Dispense Refill  . acetaminophen (TYLENOL) 500 MG tablet Take 500 mg by mouth every 6 (six) hours as needed for mild pain, moderate pain, fever or headache.    . ADVAIR DISKUS 250-50 MCG/DOSE AEPB INHALE 1 PUFF INTO THE LUNGS TWICE DAILY 60 each 5  . albuterol (PROAIR HFA) 108 (90 Base) MCG/ACT inhaler Inhale 2 puffs into the lungs every 4 (four) hours as needed. 1 Inhaler 5  . albuterol (PROVENTIL) (2.5 MG/3ML) 0.083% nebulizer solution Take 3 mLs (2.5 mg total) by nebulization every 6 (six) hours as needed for wheezing or shortness of breath. 75 mL 5  . amLODipine (NORVASC) 5 MG tablet Take 1 tablet (5 mg total) by mouth daily. 90 tablet 3  . aspirin EC 81 MG tablet Take 81 mg by mouth daily.    . cetirizine (ZYRTEC) 10 MG tablet Take 1 tablet (10 mg total) by mouth daily. (Patient taking differently: Take 10-20 mg by mouth daily as needed for allergies. ) 30 tablet 11  . DALIRESP 500 MCG TABS tablet TAKE 1 TABLET BY MOUTH EVERY DAY 90 tablet 0  . diazepam (VALIUM) 5 MG tablet TAKE 1 TABLET BY MOUTH EVERY MORNING AND 2 TABLETS BY MOUTH EVERY NIGHT AT BEDTIME (Patient taking differently: nightly) 90 tablet 5  . hyoscyamine  (LEVSIN SL) 0.125 MG SL tablet 1-2 tabs po q6h prn lower abd pain and urgent loose stools 30 tablet 3  . Lifitegrast (XIIDRA) 5 % SOLN Place 1 drop into both eyes 2 (two) times daily.    . Probiotic Product (PROBIOTIC DAILY PO) Take 2 capsules by mouth daily.     Marland Kitchen SPIRIVA HANDIHALER 18 MCG inhalation capsule INHALE 1 CAPSULE VIA HANDIHALER EVERY DAY AT THE SAME TIME 30 capsule 0  . SYNTHROID 88 MCG tablet TAKE 1 TABLET BY MOUTH DAILY 30 tablet 5  . Tetrahydrozoline HCl (EYE DROPS OP) Apply  to eye. SootheXP    . triamcinolone cream (KENALOG) 0.1 % Apply 1 application topically 2 (two) times daily. 30 g 0  . venlafaxine (EFFEXOR) 50 MG tablet TAKE 1 TABLET(50 MG) BY MOUTH TWICE DAILY 180 tablet 3   No facility-administered medications prior to visit.     Allergies  Allergen Reactions  . Losartan Other (See Comments)    hyperkalemia  . Penicillins Hives, Swelling and Rash    Has patient had a PCN reaction causing immediate rash, facial/tongue/throat swelling, SOB or lightheadedness with hypotension: YES Has patient had a PCN reaction causing severe rash involving mucus membranes or skin necrosis: NO Has patient had a PCN reaction that required hospitalization NO Has patient had a PCN reaction occurring within the last 10 years: NO If all of the above answers are "NO", then may proceed with Cephalosporin use.   . Cefixime [Kdc:Cefixime] Other (See Comments)    unspecified  . Indocin [Indomethacin] Other (See Comments)    Painful tongue and throat  . Sulfa Antibiotics Other (See Comments)    unspecified  . Ace Inhibitors Other (See Comments)    cough  . Statins Other (See Comments)    Myalgias     ROS As per HPI  PE: Blood pressure 110/64, pulse 77, temperature 98.2 F (36.8 C), temperature source Oral, resp. rate 18, height 5' (1.524 m), weight 144 lb 12.8 oz (65.7 kg), SpO2 96 %. Gen: Alert, well appearing.  Patient is oriented to person, place, time, and situation. AFFECT:  pleasant, lucid thought and speech. No further exam today.  LABS:  None today  IMPRESSION AND PLAN:  1) Recurrent C diff colitis: current episode is significantly improved but not resolved. Will treat with another 10 days of flagyl 500 mg tid. She will restart probiotic.  2) Anxiety/depression: stable.  The current medical regimen is effective;  continue present plan and medications.  3) Daytime somnolence: improved since taking her valium earlier in the evening.  An After Visit Summary was printed and given to the patient.  FOLLOW UP: Return for pt to call in 10d with report of how she's doing.  Signed:  Crissie Sickles, MD           10/13/2016

## 2016-10-13 NOTE — Progress Notes (Signed)
AWV reviewed and agree.  Signed:  Phil McGowen, MD           10/13/2016  

## 2016-10-15 ENCOUNTER — Telehealth: Payer: Self-pay | Admitting: Family Medicine

## 2016-10-15 NOTE — Telephone Encounter (Signed)
Pt advised and voiced understanding.   

## 2016-10-15 NOTE — Telephone Encounter (Signed)
Patient is having watery diarrhea. She has taken imodium. Please call.

## 2016-10-15 NOTE — Telephone Encounter (Signed)
Patient requesting call back on her cell phone since she is about to leave the house, tel # [phone].

## 2016-10-15 NOTE — Telephone Encounter (Signed)
She is on metronidazole currently for ongoing treatment of her c diff. Needs to be on metamucil otc 1-2 times per day. Needs to be on probiotic daily. If having 5 or more watery BMs per day, she may take otc imodium as directed on packaging to try to decrease the frequency of her BMs.  Tell her our goal is not to completely stop her diarrhea at this time, just diminish it. Make sure she is remembering to drink plenty of water + gatorade mix.   Continue to avoid dairy and simple sugars.

## 2016-10-15 NOTE — Telephone Encounter (Signed)
Anything I need to ask pt specifically be for I call her? I know she has a history of C.Diff. Please advise. Thanks.

## 2016-10-16 ENCOUNTER — Telehealth: Payer: Self-pay | Admitting: Family Medicine

## 2016-10-16 ENCOUNTER — Encounter: Payer: Self-pay | Admitting: Family Medicine

## 2016-10-16 ENCOUNTER — Ambulatory Visit (INDEPENDENT_AMBULATORY_CARE_PROVIDER_SITE_OTHER): Payer: Medicare Other | Admitting: Family Medicine

## 2016-10-16 VITALS — BP 96/60 | HR 82 | Temp 97.3°F | Resp 16 | Ht 60.0 in | Wt 142.0 lb

## 2016-10-16 DIAGNOSIS — T63461A Toxic effect of venom of wasps, accidental (unintentional), initial encounter: Secondary | ICD-10-CM

## 2016-10-16 MED ORDER — METHYLPREDNISOLONE ACETATE 80 MG/ML IJ SUSP
80.0000 mg | Freq: Once | INTRAMUSCULAR | Status: AC
  Administered 2016-10-16: 80 mg via INTRAMUSCULAR

## 2016-10-16 NOTE — Telephone Encounter (Signed)
[place] [facility] - Client [facility] Patient Name: Priscilla Cantrell DOB: 1936-01-27 Initial Comment She was bitten by 15-20 yellow jackets. Has some spots that are swollen. Nurse Assessment Nurse: Markus Daft, RN, Sherre Poot Date/Time (Eastern Time): 10/16/2016 9:45:46 AM Confirm and document reason for call. If symptomatic, describe symptoms. ---Caller states that she was bitten by 15-20 yellow jackets yesterday afternoon. The bites are on her arms, left shoulder, right wrist, waist, and middle of back, and they are swollen. She mixed baking soda paste and placed this on all the bites yesterday. Used Benadryl last night. Does the patient have any new or worsening symptoms? ---Yes Will a triage be completed? ---Yes Related visit to physician within the last 2 weeks? ---No Does the PT have any chronic conditions? (i.e. diabetes, asthma, etc.) ---Yes List chronic conditions. ---COPD, h/o C Diff in last 3 months or longer Is this a behavioral health or substance abuse call? ---No Guidelines Guideline Title Affirmed Question Affirmed Notes Bee or Yellow Jacket Sting Swelling is huge (e.g., > 4 inches or 10 cm, spreads beyond wrist or ankle) Final Disposition User See Physician within [place], RN, [place] Comments Seen earlier this week for C-Diff. Appt made for Dr. Anitra Lauth today at 1:30 pm. Referrals REFERRED TO PCP OFFICE Disagree/Comply: Comply

## 2016-10-16 NOTE — Telephone Encounter (Signed)
Noted  

## 2016-10-16 NOTE — Progress Notes (Signed)
OFFICE VISIT  10/16/2016   CC:  Chief Complaint  Patient presents with  . Insect Bite   HPI:    Patient is a 81 y.o. Caucasian female who presents for multiple yellow jacket stings. Happened about 18 hours ago.  Tried taking benadryl.  Some pain but mostly itching. Stings on wrists, forearms, shoulders, back.  None on face, scalp, or legs. No tylenol or NSAIDs used.  Gold bond anti-itch applied today.  No swelling in lips, throat, eyes/face.  No wheezing.   Also currently in the midst of antibiotic treatment for recurrence of C diff colitis. Has not had a loose bm today!   Past Medical History:  Diagnosis Date  . Asthma   . Chest pain, non-cardiac    Cardiac CT showed no signif obstructive dz, mildly elevated calcium level (Dr. [name]).  . Chronic cystitis with hematuria    [name]  . Chronic hypoxemic respiratory failure ([place]) 02/02/2015   2L oxygen 24/7  . COPD (chronic obstructive pulmonary disease) ([place])    spirometry 01/10/09 FEV 0.97(52%), FEV1% 47  . DDD (degenerative disc disease), cervical    1998 MRI C5-6 impingemt (left)  . DDD (degenerative disc disease), lumbar   . Depression with anxiety 04/15/2011  . Diarrheal disease Summer 2017   ? 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).  As of 10/05/15 pt reports GI dx'd her with C diff and rx'd more flagyl and she is also on cholestyramine.  As of 02/2016, pt's sx's resolved completely.  . Diverticulosis of colon    Procto '97 and colonoscopy 2002  . Dysplastic nevus of upper extremity 04/2014   R tricep ([name])  . Family history of adverse reaction to anesthesia    mother died when patient age 19 receiving anesthesia for thyroid surgery  . Fibrocystic breast disease    w/fibroadenoma.  Bx's showed NO atypia (Dr. Margot Chimes).  Mammo neg 2008.  Marland Kitchen GERD (gastroesophageal reflux disease)   . History of  double vision    Ophthalmologist, Dr. Herbert Deaner, is further evaluating this with MRI  orbits and limited brain (myesthenia gravis testing neg 07/2010)  . History of home oxygen therapy    uses 2 liters at hs  . Hypertension    EKG 03/2010 normal  . Hypothyroidism    Hashimoto's, dx'd 1989  . Hypoxemia    O2 86% RA with exertion 01/10/09, 2 liters oxygen with exertion and sleep  . Lesion of right native kidney 2017   found on CT 02/2015.  F/u MRI 03/31/15 showed it to be smaller and less likely of any significance but repeat CT renal protocol in 46mo was recommended by radiology.  This was done 10/26/15 and showed no interval change in the lesion, but b/c the lesion enhances with contrast, radiol suggested urol consultation (b/c papillary renal cell ca could not be excluded).  Saw urol 03/06/16--stable on u/s 09/2016  . Osteoarthritis, hip, bilateral    End stage; R THA  . Postmenopausal atrophic vaginitis    improved with estrace 2 X/week.  . Pulmonary nodule    CT chest 12/10, June 2011.  No nodule on CT 06/2010.  Marland Kitchen Recurrent Clostridium difficile diarrhea    Most recent episode 09/19/2016  . Recurrent UTI    likely related to pt's atrophic vaginitis.  Urol started vaginal estrace 03/2016.  Marland Kitchen Shingles 02/2014   R abd/flank  . Spinal stenosis of lumbar region  with neurogenic claudication    2008 MRI (L3-4, L4-5, L5-S1)  . Upper airway cough syndrome     Past Surgical History:  Procedure Laterality Date  . ABDOMINAL HYSTERECTOMY  1978   no BSO per pt--nonmalignant reasons  . APPENDECTOMY    . BREAST CYST EXCISION     Last screening mammogram 10/2010 was normal.  . CHOLECYSTECTOMY  2001  . COLONOSCOPY  04/2005; 12/06/2015   2007 NORMAL.  2017 Polyp x 1--path pending as of 12/2015.  Mod sigmoid diverticulosis (Dr. Earlean Shawl).    Marland Kitchen HIP CLOSED REDUCTION Right 12/25/2015   Procedure: CLOSED REDUCTION HIP;  Surgeon: Nicholes Stairs, MD;  Location: [facility];  Service: Orthopedics;  Laterality: Right;  .  TONSILLECTOMY    . TOTAL HIP ARTHROPLASTY Right 11/01/2014   Procedure: RIGHT TOTAL HIP ARTHROPLASTY;  Surgeon: Latanya Maudlin, MD;  Location: [facility];  Service: Orthopedics;  Laterality: Right;  . TRANSTHORACIC ECHOCARDIOGRAM  08/2014   EF 60-65%, grade I diast dysfxn  . TUBAL LIGATION  1974    Outpatient Medications Prior to Visit  Medication Sig Dispense Refill  . acetaminophen (TYLENOL) 500 MG tablet Take 500 mg by mouth every 6 (six) hours as needed for mild pain, moderate pain, fever or headache.    . ADVAIR DISKUS 250-50 MCG/DOSE AEPB INHALE 1 PUFF INTO THE LUNGS TWICE DAILY 60 each 5  . albuterol (PROAIR HFA) 108 (90 Base) MCG/ACT inhaler Inhale 2 puffs into the lungs every 4 (four) hours as needed. 1 Inhaler 5  . albuterol (PROVENTIL) (2.5 MG/3ML) 0.083% nebulizer solution Take 3 mLs (2.5 mg total) by nebulization every 6 (six) hours as needed for wheezing or shortness of breath. 75 mL 5  . amLODipine (NORVASC) 5 MG tablet Take 1 tablet (5 mg total) by mouth daily. 90 tablet 3  . aspirin EC 81 MG tablet Take 81 mg by mouth daily.    . cetirizine (ZYRTEC) 10 MG tablet Take 1 tablet (10 mg total) by mouth daily. (Patient taking differently: Take 10-20 mg by mouth daily as needed for allergies. ) 30 tablet 11  . DALIRESP 500 MCG TABS tablet TAKE 1 TABLET BY MOUTH EVERY DAY 90 tablet 0  . diazepam (VALIUM) 5 MG tablet TAKE 1 TABLET BY MOUTH EVERY MORNING AND 2 TABLETS BY MOUTH EVERY NIGHT AT BEDTIME (Patient taking differently: nightly) 90 tablet 5  . hyoscyamine (LEVSIN SL) 0.125 MG SL tablet 1-2 tabs po q6h prn lower abd pain and urgent loose stools 30 tablet 3  . Lifitegrast (XIIDRA) 5 % SOLN Place 1 drop into both eyes 2 (two) times daily.    . metroNIDAZOLE (FLAGYL) 500 MG tablet Take 1 tablet (500 mg total) by mouth 3 (three) times daily. 30 tablet 0  . Probiotic Product (PROBIOTIC DAILY PO) Take 2 capsules by mouth daily.     Marland Kitchen SPIRIVA HANDIHALER 18 MCG inhalation capsule INHALE 1  CAPSULE VIA HANDIHALER EVERY DAY AT THE SAME TIME 30 capsule 0  . SYNTHROID 88 MCG tablet TAKE 1 TABLET BY MOUTH DAILY 30 tablet 5  . Tetrahydrozoline HCl (EYE DROPS OP) Apply to eye. SootheXP    . triamcinolone cream (KENALOG) 0.1 % Apply 1 application topically 2 (two) times daily. 30 g 0  . venlafaxine (EFFEXOR) 50 MG tablet TAKE 1 TABLET(50 MG) BY MOUTH TWICE DAILY 180 tablet 3   No facility-administered medications prior to visit.     Allergies  Allergen Reactions  . Losartan Other (See Comments)  hyperkalemia  . Penicillins Hives, Swelling and Rash    Has patient had a PCN reaction causing immediate rash, facial/tongue/throat swelling, SOB or lightheadedness with hypotension: YES Has patient had a PCN reaction causing severe rash involving mucus membranes or skin necrosis: NO Has patient had a PCN reaction that required hospitalization NO Has patient had a PCN reaction occurring within the last 10 years: NO If all of the above answers are "NO", then may proceed with Cephalosporin use.   . Cefixime [Kdc:Cefixime] Other (See Comments)    unspecified  . Indocin [Indomethacin] Other (See Comments)    Painful tongue and throat  . Sulfa Antibiotics Other (See Comments)    unspecified  . Ace Inhibitors Other (See Comments)    cough  . Statins Other (See Comments)    Myalgias     ROS As per HPI  PE: Blood pressure 96/60, pulse 82, temperature (!) 97.3 F (36.3 C), temperature source Oral, resp. rate 16, height 5' (1.524 m), weight 142 lb (64.4 kg), SpO2 93 %. Gen: Alert, well appearing.  Patient is oriented to person, place, time, and situation. AFFECT: pleasant, lucid thought and speech. About 15 mildly erythematous, slightly raised macular lesions about 1 cm up to 3-4 cm in size. Areas affected: low and mid back, shoulders, wrists, forearms, elbows.   LABS:    Chemistry      Component Value Date/Time   NA 141 09/29/2016 1013   K 4.0 09/29/2016 1013   CL 106  09/29/2016 1013   CO2 29 09/29/2016 1013   BUN 14 09/29/2016 1013   CREATININE 0.72 09/29/2016 1013   CREATININE 0.84 02/27/2016 1533      Component Value Date/Time   CALCIUM 9.5 09/29/2016 1013   ALKPHOS 77 09/29/2016 1013   AST 15 09/29/2016 1013   ALT 12 09/29/2016 1013   BILITOT 0.6 09/29/2016 1013       IMPRESSION AND PLAN:  Multiple yellow jacket stings:  Methylprednisolone 80mg  IM in office today. Instructions: Take either allegra or zyrtec during the day for itching.  May also take a dose of benadryl at bedtime if needed. Apply hypoallergenic lotion to sting sites.  An After Visit Summary was printed and given to the patient.  FOLLOW UP: Return if symptoms worsen or fail to improve.  Signed:  Crissie Sickles, MD           10/16/2016

## 2016-10-16 NOTE — Telephone Encounter (Signed)
Apt today at 1:30pm with Dr. Anitra Lauth.

## 2016-10-16 NOTE — Patient Instructions (Signed)
Take either allegra or zyrtec during the day for itching.  May also take a dose of benadryl at bedtime if needed.

## 2016-10-17 ENCOUNTER — Telehealth: Payer: Self-pay | Admitting: Family Medicine

## 2016-10-17 MED ORDER — PREDNISONE 20 MG PO TABS: ORAL_TABLET | ORAL | 0 refills | Status: DC

## 2016-10-17 NOTE — Telephone Encounter (Signed)
Patient advised, voiced understanding.

## 2016-10-17 NOTE — Telephone Encounter (Signed)
May apply aloe vera cream/lotion/ointment. No other topical meds will help with this. Prednisone eRx'd.

## 2016-10-17 NOTE — Telephone Encounter (Signed)
Patient called stating her arms is itching terrible, arms are getting pink and moving up toward the elbow.    Patient advised that Dr Jerilynn Mages was going to send in some prednisone to [facility].   **Patient wants to know if there is something she can get topical for itch?  Please advise.

## 2016-10-27 DIAGNOSIS — R195 Other fecal abnormalities: Secondary | ICD-10-CM | POA: Diagnosis not present

## 2016-10-27 DIAGNOSIS — R197 Diarrhea, unspecified: Secondary | ICD-10-CM | POA: Diagnosis not present

## 2016-10-28 ENCOUNTER — Encounter: Payer: Self-pay | Admitting: Family Medicine

## 2016-11-07 ENCOUNTER — Ambulatory Visit (INDEPENDENT_AMBULATORY_CARE_PROVIDER_SITE_OTHER): Payer: Medicare Other

## 2016-11-07 DIAGNOSIS — Z961 Presence of intraocular lens: Secondary | ICD-10-CM | POA: Diagnosis not present

## 2016-11-07 DIAGNOSIS — H16229 Keratoconjunctivitis sicca, not specified as Sjogren's, unspecified eye: Secondary | ICD-10-CM | POA: Diagnosis not present

## 2016-11-07 DIAGNOSIS — Z23 Encounter for immunization: Secondary | ICD-10-CM | POA: Diagnosis not present

## 2016-11-07 DIAGNOSIS — H04123 Dry eye syndrome of bilateral lacrimal glands: Secondary | ICD-10-CM | POA: Diagnosis not present

## 2016-11-07 DIAGNOSIS — H35363 Drusen (degenerative) of macula, bilateral: Secondary | ICD-10-CM | POA: Diagnosis not present

## 2016-11-07 DIAGNOSIS — H35373 Puckering of macula, bilateral: Secondary | ICD-10-CM | POA: Diagnosis not present

## 2016-11-14 ENCOUNTER — Other Ambulatory Visit: Payer: Self-pay | Admitting: Pulmonary Disease

## 2016-11-18 ENCOUNTER — Encounter: Payer: Self-pay | Admitting: Family Medicine

## 2016-11-18 ENCOUNTER — Ambulatory Visit (INDEPENDENT_AMBULATORY_CARE_PROVIDER_SITE_OTHER): Payer: Medicare Other | Admitting: Family Medicine

## 2016-11-18 VITALS — BP 121/73 | HR 73 | Temp 98.1°F | Resp 16 | Ht 60.0 in | Wt 141.5 lb

## 2016-11-18 DIAGNOSIS — A0471 Enterocolitis due to Clostridium difficile, recurrent: Secondary | ICD-10-CM

## 2016-11-18 DIAGNOSIS — F411 Generalized anxiety disorder: Secondary | ICD-10-CM | POA: Diagnosis not present

## 2016-11-18 DIAGNOSIS — J9611 Chronic respiratory failure with hypoxia: Secondary | ICD-10-CM | POA: Diagnosis not present

## 2016-11-18 DIAGNOSIS — F3342 Major depressive disorder, recurrent, in full remission: Secondary | ICD-10-CM | POA: Diagnosis not present

## 2016-11-18 DIAGNOSIS — J449 Chronic obstructive pulmonary disease, unspecified: Secondary | ICD-10-CM

## 2016-11-18 NOTE — Progress Notes (Signed)
OFFICE VISIT  11/18/2016   CC:  Chief Complaint  Patient presents with  . Follow-up    RCI. pt is not fasting.    HPI:    Patient is a 81 y.o. Caucasian female who presents for f/u COPD/chronic hypoxemic resp failure, anx/dep, hx of recurrent C diff colitis.   Still having loose BMs---somewhat formed but falls apart easily when flushes.  Having 3-4 BMs per morning still. She'll be contacting her GI MD later today to see if anything different to do.  No fevers, no pus or blood in stool. Taking metamucil bid and this has helped.  No signif abd pain/cramping.  Appetite is fine.  Some slight cough lately with increased time spent around leaves outside, but nothing persistent or productive.  Overall she says her breathing is feeling stable.  She does wear 1-2 L oxygen when she does moderate activity.  Anxiety/depression: feeling good from a standpoint of these problems.  Staying busy helps a lot. Tolerating meds well.  Sleeping well.    Past Medical History:  Diagnosis Date  . Asthma   . Chest pain, non-cardiac    Cardiac CT showed no signif obstructive dz, mildly elevated calcium level (Dr. [name]).  . Chronic cystitis with hematuria    [name]  . Chronic hypoxemic respiratory failure ([place]) 02/02/2015   2L oxygen 24/7  . COPD (chronic obstructive pulmonary disease) ([place])    spirometry 01/10/09 FEV 0.97(52%), FEV1% 47  . DDD (degenerative disc disease), cervical    1998 MRI C5-6 impingemt (left)  . DDD (degenerative disc disease), lumbar   . Depression with anxiety 04/15/2011  . Diarrheal disease Summer 2017   ? 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).  As of 10/05/15 pt reports GI dx'd her with C diff and rx'd more flagyl and she is also on cholestyramine.  As of 02/2016, pt's sx's resolved completely.  . Diverticulosis of colon    Procto '97 and colonoscopy 2002   . Dysplastic nevus of upper extremity 04/2014   R tricep ([name])  . Family history of adverse reaction to anesthesia    mother died when patient age 67 receiving anesthesia for thyroid surgery  . Fibrocystic breast disease    w/fibroadenoma.  Bx's showed NO atypia (Dr. Margot Chimes).  Mammo neg 2008.  Marland Kitchen GERD (gastroesophageal reflux disease)   . History of double vision    Ophthalmologist, Dr. Herbert Deaner, is further evaluating this with MRI  orbits and limited brain (myesthenia gravis testing neg 07/2010)  . History of home oxygen therapy    uses 2 liters at hs  . Hypertension    EKG 03/2010 normal  . Hypothyroidism    Hashimoto's, dx'd 1989  . Hypoxemia    O2 86% RA with exertion 01/10/09, 2 liters oxygen with exertion and sleep  . Lesion of right native kidney 2017   found on CT 02/2015.  F/u MRI 03/31/15 showed it to be smaller and less likely of any significance but repeat CT renal protocol in 54mo was recommended by radiology.  This was done 10/26/15 and showed no interval change in the lesion, but b/c the lesion enhances with contrast, radiol suggested urol consultation (b/c papillary renal cell ca could not be excluded).  Saw urol 03/06/16--stable on u/s 09/2016  . Osteoarthritis, hip, bilateral    End stage; R THA  . Postmenopausal atrophic vaginitis    improved with estrace 2 X/week.  Marland Kitchen  Pulmonary nodule    CT chest 12/10, June 2011.  No nodule on CT 06/2010.  Marland Kitchen Recurrent Clostridium difficile diarrhea    Most recent episode 09/19/2016: resolved with prolonged course of flagyl.  If recurrence again, treat with oral vanco per GI MD recommendations.  . Recurrent UTI    likely related to pt's atrophic vaginitis.  Urol started vaginal estrace 03/2016.  Marland Kitchen Shingles 02/2014   R abd/flank  . 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 Upper airway cough syndrome     Past Surgical History:  Procedure Laterality Date  . ABDOMINAL HYSTERECTOMY   1978   no BSO per pt--nonmalignant reasons  . APPENDECTOMY    . BREAST CYST EXCISION     Last screening mammogram 10/2010 was normal.  . CHOLECYSTECTOMY  2001  . COLONOSCOPY  04/2005; 12/06/2015   2007 NORMAL.  2017 adenomatous polyp x 1.  Mod sigmoid diverticulosis (Dr. Earlean Shawl).    Marland Kitchen HIP CLOSED REDUCTION Right 12/25/2015   Procedure: CLOSED REDUCTION HIP;  Surgeon: Nicholes Stairs, MD;  Location: [facility];  Service: Orthopedics;  Laterality: Right;  . TONSILLECTOMY    . TOTAL HIP ARTHROPLASTY Right 11/01/2014   Procedure: RIGHT TOTAL HIP ARTHROPLASTY;  Surgeon: Latanya Maudlin, MD;  Location: [facility];  Service: Orthopedics;  Laterality: Right;  . TRANSTHORACIC ECHOCARDIOGRAM  08/2014   EF 60-65%, grade I diast dysfxn  . TUBAL LIGATION  1974    Outpatient Medications Prior to Visit  Medication Sig Dispense Refill  . acetaminophen (TYLENOL) 500 MG tablet Take 500 mg by mouth every 6 (six) hours as needed for mild pain, moderate pain, fever or headache.    . ADVAIR DISKUS 250-50 MCG/DOSE AEPB INHALE 1 PUFF INTO THE LUNGS TWICE DAILY 60 each 5  . albuterol (PROAIR HFA) 108 (90 Base) MCG/ACT inhaler Inhale 2 puffs into the lungs every 4 (four) hours as needed. 1 Inhaler 5  . albuterol (PROVENTIL) (2.5 MG/3ML) 0.083% nebulizer solution Take 3 mLs (2.5 mg total) by nebulization every 6 (six) hours as needed for wheezing or shortness of breath. 75 mL 5  . amLODipine (NORVASC) 5 MG tablet Take 1 tablet (5 mg total) by mouth daily. 90 tablet 3  . aspirin EC 81 MG tablet Take 81 mg by mouth daily.    . cetirizine (ZYRTEC) 10 MG tablet Take 1 tablet (10 mg total) by mouth daily. (Patient taking differently: Take 10-20 mg by mouth daily as needed for allergies. ) 30 tablet 11  . DALIRESP 500 MCG TABS tablet TAKE 1 TABLET BY MOUTH EVERY DAY 90 tablet 1  . diazepam (VALIUM) 5 MG tablet TAKE 1 TABLET BY MOUTH EVERY MORNING AND 2 TABLETS BY MOUTH EVERY NIGHT AT BEDTIME (Patient taking differently: nightly)  90 tablet 5  . hyoscyamine (LEVSIN SL) 0.125 MG SL tablet 1-2 tabs po q6h prn lower abd pain and urgent loose stools 30 tablet 3  . Lifitegrast (XIIDRA) 5 % SOLN Place 1 drop into both eyes 2 (two) times daily.    . Probiotic Product (PROBIOTIC DAILY PO) Take 2 capsules by mouth daily.     Marland Kitchen SPIRIVA HANDIHALER 18 MCG inhalation capsule INHALE 1 CAPSULE VIA HANDIHALER EVERY DAY AT THE SAME TIME 30 capsule 0  . SYNTHROID 88 MCG tablet TAKE 1 TABLET BY MOUTH DAILY 30 tablet 5  . Tetrahydrozoline HCl (EYE DROPS OP) Apply to eye. SootheXP    . triamcinolone cream (KENALOG)  0.1 % Apply 1 application topically 2 (two) times daily. 30 g 0  . venlafaxine (EFFEXOR) 50 MG tablet TAKE 1 TABLET(50 MG) BY MOUTH TWICE DAILY 180 tablet 3  . predniSONE (DELTASONE) 20 MG tablet 2 tabs po qd x 3d, then 1 tab po qd x 3d, then 1/2 tab po qd x 2d (Patient not taking: Reported on 11/18/2016) 10 tablet 0   No facility-administered medications prior to visit.     Allergies  Allergen Reactions  . Losartan Other (See Comments)    hyperkalemia  . Penicillins Hives, Swelling and Rash    Has patient had a PCN reaction causing immediate rash, facial/tongue/throat swelling, SOB or lightheadedness with hypotension: YES Has patient had a PCN reaction causing severe rash involving mucus membranes or skin necrosis: NO Has patient had a PCN reaction that required hospitalization NO Has patient had a PCN reaction occurring within the last 10 years: NO If all of the above answers are "NO", then may proceed with Cephalosporin use.   . Cefixime [Kdc:Cefixime] Other (See Comments)    unspecified  . Indocin [Indomethacin] Other (See Comments)    Painful tongue and throat  . Sulfa Antibiotics Other (See Comments)    unspecified  . Ace Inhibitors Other (See Comments)    cough  . Statins Other (See Comments)    Myalgias     ROS As per HPI  PE: Blood pressure 121/73, pulse 73, temperature 98.1 F (36.7 C), temperature  source Oral, resp. rate 16, height 5' (1.524 m), weight 141 lb 8 oz (64.2 kg), SpO2 91 %.RA Gen: Alert, well appearing.  Patient is oriented to person, place, time, and situation. AFFECT: pleasant, lucid thought and speech. CV: RRR, no m/r/g.   LUNGS: CTA bilat, nonlabored resps, good aeration in all lung fields. EXT: no clubbing or cyanosis.  She has 1+ bilat pitting edema.  LABS:  Lab Results  Component Value Date   TSH 1.65 09/29/2016   Lab Results  Component Value Date   WBC 6.2 09/29/2016   HGB 14.0 09/29/2016   HCT 42.9 09/29/2016   MCV 88.6 09/29/2016   PLT 284.0 09/29/2016   Lab Results  Component Value Date   CREATININE 0.72 09/29/2016   BUN 14 09/29/2016   NA 141 09/29/2016   K 4.0 09/29/2016   CL 106 09/29/2016   CO2 29 09/29/2016   Lab Results  Component Value Date   ALT 12 09/29/2016   AST 15 09/29/2016   ALKPHOS 77 09/29/2016   BILITOT 0.6 09/29/2016   Lab Results  Component Value Date   CHOL 193 06/18/2015   Lab Results  Component Value Date   HDL 61.30 06/18/2015   Lab Results  Component Value Date   LDLCALC 106 (H) 06/18/2015   Lab Results  Component Value Date   TRIG 124.0 06/18/2015   Lab Results  Component Value Date   CHOLHDL 3 06/18/2015   Lab Results  Component Value Date   HGBA1C 5.7 10/29/2011    IMPRESSION AND PLAN:  1) COPD/chronic hypoxic resp failure: stable. The current medical regimen is effective;  continue present plan and medications + supplemental oxygen.  2) GAD with episodic mild depression: in remission.  The current medical regimen is effective;  continue present plan and medications.  3) Hx of recurrent C diff.  I think the current bowel functioning she is having is more of functional disorder s/p her latest C diff episode.  She is going to contact her GI MD,  Dr. Watt Climes, to inquire about anything new to do at this time based on current sx's.  I did not add/change any meds today.  No lab work due today. Flu  vaccine UTD.  An After Visit Summary was printed and given to the patient.  FOLLOW UP: Return in about 6 months (around 05/19/2017) for routine chronic illness f/u.  Signed:  Crissie Sickles, MD           11/18/2016

## 2016-11-18 NOTE — Patient Instructions (Addendum)
Toxigenic C diff PCR.  Result was "detected" (09/18/16).

## 2016-11-26 DIAGNOSIS — R197 Diarrhea, unspecified: Secondary | ICD-10-CM | POA: Diagnosis not present

## 2016-12-02 ENCOUNTER — Telehealth: Payer: Self-pay | Admitting: Family Medicine

## 2016-12-02 NOTE — Telephone Encounter (Signed)
Pt advised and voiced understanding.   

## 2016-12-02 NOTE — Telephone Encounter (Signed)
May 2017.

## 2016-12-02 NOTE — Telephone Encounter (Signed)
Patient would like to know when she was first diagnosed with C-diff.  She is requesting call back with this info.

## 2016-12-02 NOTE — Telephone Encounter (Signed)
Please advise. Thanks.  

## 2016-12-17 DIAGNOSIS — R197 Diarrhea, unspecified: Secondary | ICD-10-CM | POA: Diagnosis not present

## 2016-12-17 DIAGNOSIS — B9689 Other specified bacterial agents as the cause of diseases classified elsewhere: Secondary | ICD-10-CM | POA: Diagnosis not present

## 2016-12-25 ENCOUNTER — Other Ambulatory Visit: Payer: Self-pay | Admitting: Family Medicine

## 2016-12-29 NOTE — Telephone Encounter (Signed)
Rx faxed

## 2016-12-29 NOTE — Telephone Encounter (Signed)
[facility] [place]  RF request for diazepam LOV: 11/18/16 Next ov: 05/20/17 Last written: 07/27/15 #90 w/ 5RF  Please advise. Thanks.

## 2017-01-07 DIAGNOSIS — R3915 Urgency of urination: Secondary | ICD-10-CM | POA: Diagnosis not present

## 2017-01-14 DIAGNOSIS — R197 Diarrhea, unspecified: Secondary | ICD-10-CM | POA: Diagnosis not present

## 2017-01-19 ENCOUNTER — Encounter: Payer: Self-pay | Admitting: Family Medicine

## 2017-01-19 DIAGNOSIS — L814 Other melanin hyperpigmentation: Secondary | ICD-10-CM | POA: Diagnosis not present

## 2017-01-19 DIAGNOSIS — D229 Melanocytic nevi, unspecified: Secondary | ICD-10-CM | POA: Diagnosis not present

## 2017-01-19 DIAGNOSIS — D492 Neoplasm of unspecified behavior of bone, soft tissue, and skin: Secondary | ICD-10-CM | POA: Diagnosis not present

## 2017-01-19 DIAGNOSIS — L820 Inflamed seborrheic keratosis: Secondary | ICD-10-CM | POA: Diagnosis not present

## 2017-01-21 ENCOUNTER — Ambulatory Visit (INDEPENDENT_AMBULATORY_CARE_PROVIDER_SITE_OTHER): Payer: Medicare Other | Admitting: Pulmonary Disease

## 2017-01-21 ENCOUNTER — Encounter: Payer: Self-pay | Admitting: Pulmonary Disease

## 2017-01-21 VITALS — BP 114/64 | HR 74 | Ht 60.0 in | Wt 139.0 lb

## 2017-01-21 DIAGNOSIS — J449 Chronic obstructive pulmonary disease, unspecified: Secondary | ICD-10-CM

## 2017-01-21 DIAGNOSIS — J9611 Chronic respiratory failure with hypoxia: Secondary | ICD-10-CM | POA: Diagnosis not present

## 2017-01-21 DIAGNOSIS — R058 Other specified cough: Secondary | ICD-10-CM

## 2017-01-21 DIAGNOSIS — R05 Cough: Secondary | ICD-10-CM | POA: Diagnosis not present

## 2017-01-21 MED ORDER — FLUTICASONE PROPIONATE 50 MCG/ACT NA SUSP: 1.0000 | Freq: Every day | NASAL | 2 refills | Status: DC

## 2017-01-21 NOTE — Progress Notes (Signed)
[facility]  Chief Complaint  Patient presents with  . Follow-up    Pt has horseness and sore throat for last 3-4 weeks. Pt is having harder time breathing, has SOB with exertion. Pt uses 2 liters of O2 during day PRN and 2 liters each night.    Vital signs: BP 114/64 (BP Location: Left Arm, Cuff Size: Normal)   Pulse 74   Ht 5' (1.524 m)   Wt 139 lb (63 kg)   SpO2 95%   BMI 27.15 kg/m   History of Present Illness: Priscilla Cantrell is a 81 y.o. female former smoker with GOLD 4 COPD, ACE cough, and nocturnal hypoxemia.  She had extended tx for C diff and finally cleared in November.  No further diarrhea.  Not having cough, wheeze, sputum.  Still using oxygen at night and during the day with exertion.  She has been getting sinus congestion, post nasal drip, and throat irritation.  No fever or gland swelling.  Got a new dog.  Physical Exam:  General - pleasant Eyes - pupils reactive ENT - no sinus tenderness, no oral exudate, no LAN Cardiac - regular, no murmur Chest - no wheeze, rales Abd - soft, non tender Ext - no edema Skin - no rashes Neuro - normal strength Psych - normal mood  Assessment/Plan:  GOLD 4 COPD. - continue spiriva, advair, daliresp - prn proair  Chronic respiratory failure with hypoxia. - 2 liters oxygen with exertion and at night  Upper airway cough. - zyrtec, flonase, nasal irrigation for 2 weeks and then as needed   Patient Instructions  Flonase 1 spray each nostril daily for 2 weeks, then as needed   Follow up in 6 months    Chesley Mires, MD [place] 01/21/2017, 11:47 AM Pager:  [phone]  Flow Sheet  Pulmonary tests Spirometry 01/10/09>>FEV1 0.97(52%), FEV1% 47 March 2012>>Daliresp started  Sleep tests PSG 11/14/11 >> AHI 0.3 ONO with RA 07/31/16 >> test time 6 hrs 41 min.  Average SpO2 87%, low SpO2 74%.  Spent 4 hrs 13 min with SpO2 < 88%.  Cardiac  tests Echo 08/30/14 >> EF 60 to 54%, grade 1 diastolic dysfx, PAS 24 mmHG  Past Medical History: She  has a past medical history of Asthma, Chest pain, non-cardiac, Chronic cystitis with hematuria, Chronic hypoxemic respiratory failure ([place]) (02/02/2015), COPD (chronic obstructive pulmonary disease) ([place]), DDD (degenerative disc disease), cervical, DDD (degenerative disc disease), lumbar, Depression with anxiety (04/15/2011), Diarrheal disease (Summer 2017), Diverticulosis of colon, Dysplastic nevus of upper extremity (04/2014), Family history of adverse reaction to anesthesia, Fibrocystic breast disease, GERD (gastroesophageal reflux disease), History of double vision, History of home oxygen therapy, Hypertension, Hypothyroidism, Hypoxemia, Lesion of right native kidney (2017), Osteoarthritis, hip, bilateral, Postmenopausal atrophic vaginitis, Pulmonary nodule, Recurrent Clostridium difficile diarrhea, Recurrent UTI, Shingles (02/2014), Spinal stenosis of lumbar region with neurogenic claudication, and Upper airway cough syndrome.  Past Surgical History: She  has a past surgical history that includes Cholecystectomy (2001); Tubal ligation (1974); Abdominal hysterectomy (1978); Breast cyst excision; Appendectomy; Tonsillectomy; Colonoscopy (04/2005; 12/06/2015); transthoracic echocardiogram (08/2014); Total hip arthroplasty (Right, 11/01/2014); and Hip Closed Reduction (Right, 12/25/2015).  Family History: Her family history includes Cancer in her paternal aunt and paternal grandfather; Cirrhosis in her father; Diabetes in her daughter; Goiter in her mother; Hernia in her paternal grandmother; Hypertension in her paternal grandmother; Psoriasis in her father; Stroke in her maternal grandfather and paternal grandmother.  Social History: She  reports  that she quit smoking about 26 years ago. Her smoking use included cigarettes. She has a 70.00 pack-year smoking history. she has never used smokeless tobacco. She  reports that she drinks alcohol. She reports that she does not use drugs.  Medications: Allergies as of 01/21/2017      Reactions   Losartan Other (See Comments)   hyperkalemia   Penicillins Hives, Swelling, Rash   Has patient had a PCN reaction causing immediate rash, facial/tongue/throat swelling, SOB or lightheadedness with hypotension: YES Has patient had a PCN reaction causing severe rash involving mucus membranes or skin necrosis: NO Has patient had a PCN reaction that required hospitalization NO Has patient had a PCN reaction occurring within the last 10 years: NO If all of the above answers are "NO", then may proceed with Cephalosporin use.   Cefixime [kdc:cefixime] Other (See Comments)   unspecified   Indocin [indomethacin] Other (See Comments)   Painful tongue and throat   Sulfa Antibiotics Other (See Comments)   unspecified   Ace Inhibitors Other (See Comments)   cough   Statins Other (See Comments)   Myalgias      Medication List        Accurate as of 01/21/17 11:47 AM. Always use your most recent med list.          acetaminophen 500 MG tablet Commonly known as:  TYLENOL Take 500 mg by mouth every 6 (six) hours as needed for mild pain, moderate pain, fever or headache.   ADVAIR DISKUS 250-50 MCG/DOSE Aepb Generic drug:  Fluticasone-Salmeterol INHALE 1 PUFF INTO THE LUNGS TWICE DAILY   albuterol 108 (90 Base) MCG/ACT inhaler Commonly known as:  PROAIR HFA Inhale 2 puffs into the lungs every 4 (four) hours as needed.   albuterol (2.5 MG/3ML) 0.083% nebulizer solution Commonly known as:  PROVENTIL Take 3 mLs (2.5 mg total) by nebulization every 6 (six) hours as needed for wheezing or shortness of breath.   amLODipine 5 MG tablet Commonly known as:  NORVASC Take 1 tablet (5 mg total) by mouth daily.   aspirin EC 81 MG tablet Take 81 mg by mouth daily.   cetirizine 10 MG tablet Commonly known as:  ZYRTEC Take 1 tablet (10 mg total) by mouth daily.    DALIRESP 500 MCG Tabs tablet Generic drug:  roflumilast TAKE 1 TABLET BY MOUTH EVERY DAY   diazepam 5 MG tablet Commonly known as:  VALIUM TAKE 1 TABLET BY MOUTH EVERY MORNING AND 2 TABLETS EVERY NIGHT AT BEDTIME   EYE DROPS OP Apply to eye. SootheXP   fluticasone 50 MCG/ACT nasal spray Commonly known as:  FLONASE Place 1 spray into both nostrils daily.   hyoscyamine 0.125 MG SL tablet Commonly known as:  LEVSIN SL 1-2 tabs po q6h prn lower abd pain and urgent loose stools   PROBIOTIC DAILY PO Take 2 capsules by mouth daily.   SPIRIVA HANDIHALER 18 MCG inhalation capsule Generic drug:  tiotropium INHALE 1 CAPSULE VIA HANDIHALER EVERY DAY AT THE SAME TIME   SYNTHROID 88 MCG tablet Generic drug:  levothyroxine TAKE 1 TABLET BY MOUTH DAILY   triamcinolone cream 0.1 % Commonly known as:  KENALOG Apply 1 application topically 2 (two) times daily.   venlafaxine 50 MG tablet Commonly known as:  EFFEXOR TAKE 1 TABLET(50 MG) BY MOUTH TWICE DAILY   XIIDRA 5 % Soln Generic drug:  Lifitegrast Place 1 drop into both eyes 2 (two) times daily.

## 2017-01-21 NOTE — Patient Instructions (Signed)
Flonase 1 spray each nostril daily for 2 weeks, then as needed   Follow up in 6 months

## 2017-01-27 ENCOUNTER — Other Ambulatory Visit: Payer: Self-pay | Admitting: Pulmonary Disease

## 2017-01-29 ENCOUNTER — Telehealth: Payer: Self-pay | Admitting: Family Medicine

## 2017-01-29 NOTE — Telephone Encounter (Signed)
Noted.  Will try my best.

## 2017-01-29 NOTE — Telephone Encounter (Signed)
Copied from [place]. Topic: Quick Communication - See Telephone Encounter >> Jan 29, 2017 10:25 AM Ivar Drape wrote: CRM for notification. See Telephone encounter for:  01/29/17. [name] GI doctor, Dr. Earlean Shawl said the patient is not to have any kind of antibiotics. So the patient would like that noted in her chart.

## 2017-01-29 NOTE — Telephone Encounter (Signed)
FYI

## 2017-02-13 ENCOUNTER — Ambulatory Visit (INDEPENDENT_AMBULATORY_CARE_PROVIDER_SITE_OTHER): Payer: Medicare Other | Admitting: Family Medicine

## 2017-02-13 ENCOUNTER — Encounter: Payer: Self-pay | Admitting: Family Medicine

## 2017-02-13 VITALS — BP 142/76 | HR 71 | Temp 97.8°F | Wt 138.4 lb

## 2017-02-13 DIAGNOSIS — R350 Frequency of micturition: Secondary | ICD-10-CM | POA: Diagnosis not present

## 2017-02-13 DIAGNOSIS — R8290 Unspecified abnormal findings in urine: Secondary | ICD-10-CM | POA: Diagnosis not present

## 2017-02-13 LAB — POC URINALSYSI DIPSTICK (AUTOMATED)
Bilirubin, UA: NEGATIVE
Glucose, UA: NEGATIVE
Ketones, UA: NEGATIVE
Nitrite, UA: NEGATIVE
Spec Grav, UA: 1.02 (ref 1.010–1.025)
Urobilinogen, UA: 0.2 E.U./dL
pH, UA: 6 (ref 5.0–8.0)

## 2017-02-13 MED ORDER — NITROFURANTOIN MONOHYD MACRO 100 MG PO CAPS: 100.0000 mg | ORAL_CAPSULE | Freq: Two times a day (BID) | ORAL | 0 refills | Status: DC

## 2017-02-13 NOTE — Progress Notes (Signed)
Priscilla Cantrell , [birth year], 82 y.o., female MRN: [id number] Patient Care Team    Relationship Specialty Notifications Start End  [name], MD PCP - General   01/16/10    Comment: Arnetha Gula, MD Consulting Physician Cardiology  06/06/10   Monna Fam, MD Consulting Physician Ophthalmology  01/29/11   Chesley Mires, MD Consulting Physician Pulmonary Disease  12/21/12   Latanya Maudlin, MD Consulting Physician Orthopedic Surgery  09/26/14   Cleon Gustin, MD Consulting Physician Urology  03/10/16   Richmond Campbell, MD Consulting Physician Gastroenterology  10/28/16     Chief Complaint  Patient presents with  . Urinary Tract Infection    pts c/o frequent cloudy and maladorous urine but denies pain and incomplete emptying     Subjective: Pt presents for an OV with complaints of foul smelling urine and cloudy urine of 2.5 months duration.  Associated symptoms include urinary frequency. Spasm sensation at the end of her urinary stream. She denies fever, chills, nausea, vomit, dysuria, low back pain.  Pt has reports of "frequent UTI". Last positive culture in EMR 1/19//2018 proteus Mirabilis. She was seen by urology x2 since Thanksgiving. Pt reports she has had UTI since that time treated with macrobid. She does not feel her symptoms were ever completely resolved.  Pt has been treated for Chronic C.Diff infection over the last 1.5 years. Her gastroenterologist has requested for her not to have any type of antibiotics (per pt). Pt has a right renal mass, which is followed by urology with [place] due this this Summer.   Depression screen [facility] 2/9 11/18/2016 10/13/2016 10/13/2016 09/18/2015 09/06/2014  Decreased Interest 0 0 0 0 0  Down, Depressed, Hopeless 0 0 0 1 0  PHQ - 2 Score 0 0 0 1 0  Altered sleeping 0 0 - - -  Tired, decreased energy 0 1 - - -  Change in appetite 0 0 - - -  Feeling bad or failure about yourself  0 0 - - -  Trouble concentrating 0 0 - - -  Moving  slowly or fidgety/restless 0 0 - - -  Suicidal thoughts 0 0 - - -  PHQ-9 Score 0 1 - - -  Difficult doing work/chores - Somewhat difficult - - -  Some recent data might be hidden    Allergies  Allergen Reactions  . Losartan Other (See Comments)    hyperkalemia  . Penicillins Hives, Swelling and Rash    Has patient had a PCN reaction causing immediate rash, facial/tongue/throat swelling, SOB or lightheadedness with hypotension: YES Has patient had a PCN reaction causing severe rash involving mucus membranes or skin necrosis: NO Has patient had a PCN reaction that required hospitalization NO Has patient had a PCN reaction occurring within the last 10 years: NO If all of the above answers are "NO", then may proceed with Cephalosporin use.   . Cefixime [Kdc:Cefixime] Other (See Comments)    unspecified  . Indocin [Indomethacin] Other (See Comments)    Painful tongue and throat  . Sulfa Antibiotics Other (See Comments)    unspecified  . Ace Inhibitors Other (See Comments)    cough  . Statins Other (See Comments)    Myalgias    Social History   Tobacco Use  . Smoking status: Former Smoker    Packs/day: 2.00    Years: 35.00    Pack years: 70.00    Types: Cigarettes    Last attempt to quit: 02/03/1990  Years since quitting: 27.0  . Smokeless tobacco: Never Used  Substance Use Topics  . Alcohol use: Yes    Comment: Rarely   Past Medical History:  Diagnosis Date  . Asthma   . Chest pain, non-cardiac    Cardiac CT showed no signif obstructive dz, mildly elevated calcium level (Dr. [name]).  . Chronic cystitis with hematuria    [name]  . Chronic hypoxemic respiratory failure ([place]) 02/02/2015   2L oxygen 24/7  . COPD (chronic obstructive pulmonary disease) ([place])    spirometry 01/10/09 FEV 0.97(52%), FEV1% 47  . DDD (degenerative disc disease), cervical    1998 MRI C5-6 impingemt (left)  . DDD (degenerative disc disease), lumbar   . Depression with  anxiety 04/15/2011  . Diarrheal disease Summer 2017   ? 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).  As of 10/05/15 pt reports GI dx'd her with C diff and rx'd more flagyl and she is also on cholestyramine.  As of 02/2016, pt's sx's resolved completely.  . Diverticulosis of colon    Procto '97 and colonoscopy 2002  . Dysplastic nevus of upper extremity 04/2014   R tricep ([name])  . Family history of adverse reaction to anesthesia    mother died when patient age 77 receiving anesthesia for thyroid surgery  . Fibrocystic breast disease    w/fibroadenoma.  Bx's showed NO atypia (Dr. Margot Chimes).  Mammo neg 2008.  Marland Kitchen GERD (gastroesophageal reflux disease)   . History of double vision    Ophthalmologist, Dr. Herbert Deaner, is further evaluating this with MRI  orbits and limited brain (myesthenia gravis testing neg 07/2010)  . History of home oxygen therapy    uses 2 liters at hs  . Hypertension    EKG 03/2010 normal  . Hypothyroidism    Hashimoto's, dx'd 1989  . Hypoxemia    O2 86% RA with exertion 01/10/09, 2 liters oxygen with exertion and sleep  . Lesion of right native kidney 2017   found on CT 02/2015.  F/u MRI 03/31/15 showed it to be smaller and less likely of any significance but repeat CT renal protocol in 34mo was recommended by radiology.  This was done 10/26/15 and showed no interval change in the lesion, but b/c the lesion enhances with contrast, radiol suggested urol consultation (b/c papillary renal cell ca could not be excluded).  Saw urol 03/06/16--stable on u/s 09/2016  . Osteoarthritis, hip, bilateral    End stage; R THA  . Postmenopausal atrophic vaginitis    improved with estrace 2 X/week.  . Pulmonary nodule    CT chest 12/10, June 2011.  No nodule on CT 06/2010.  Marland Kitchen Recurrent Clostridium difficile diarrhea    Episode 09/19/2016: resolved with prolonged course of flagyl.  11/2016 recurrence  treated with 10d of Dificid (Fidaxomicin) by GI (Dr. Earlean Shawl).  . Recurrent UTI    likely related to pt's atrophic vaginitis.  Urol started vaginal estrace 03/2016.  Marland Kitchen Shingles 02/2014   R abd/flank  . 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 Upper airway cough syndrome    Past Surgical History:  Procedure Laterality Date  . ABDOMINAL HYSTERECTOMY  1978   no BSO per pt--nonmalignant reasons  . APPENDECTOMY    . BREAST CYST EXCISION     Last screening mammogram 10/2010 was normal.  . CHOLECYSTECTOMY  2001  . COLONOSCOPY  04/2005; 12/06/2015   2007 NORMAL.  2017 adenomatous polyp x 1.  Mod sigmoid diverticulosis (Dr. Earlean Shawl).    Marland Kitchen HIP CLOSED REDUCTION Right 12/25/2015   Procedure: CLOSED REDUCTION HIP;  Surgeon: Nicholes Stairs, MD;  Location: [facility];  Service: Orthopedics;  Laterality: Right;  . TONSILLECTOMY    . TOTAL HIP ARTHROPLASTY Right 11/01/2014   Procedure: RIGHT TOTAL HIP ARTHROPLASTY;  Surgeon: Latanya Maudlin, MD;  Location: [facility];  Service: Orthopedics;  Laterality: Right;  . TRANSTHORACIC ECHOCARDIOGRAM  08/2014   EF 60-65%, grade I diast dysfxn  . TUBAL LIGATION  1974   Family History  Problem Relation Age of Onset  . Goiter Mother   . Psoriasis Father        od liver  . Cirrhosis Father   . Diabetes Daughter        Type 2  . Cancer Paternal Aunt        stomach?  . Stroke Maternal Grandfather   . Stroke Paternal Grandmother   . Hypertension Paternal Grandmother   . Hernia Paternal Grandmother   . Cancer Paternal Grandfather        lung   Allergies as of 02/13/2017      Reactions   Losartan Other (See Comments)   hyperkalemia   Penicillins Hives, Swelling, Rash   Has patient had a PCN reaction causing immediate rash, facial/tongue/throat swelling, SOB or lightheadedness with hypotension: YES Has patient had a PCN reaction causing severe rash involving mucus membranes or skin necrosis: NO Has patient  had a PCN reaction that required hospitalization NO Has patient had a PCN reaction occurring within the last 10 years: NO If all of the above answers are "NO", then may proceed with Cephalosporin use.   Cefixime [kdc:cefixime] Other (See Comments)   unspecified   Indocin [indomethacin] Other (See Comments)   Painful tongue and throat   Sulfa Antibiotics Other (See Comments)   unspecified   Ace Inhibitors Other (See Comments)   cough   Statins Other (See Comments)   Myalgias      Medication List        Accurate as of 02/13/17  2:28 PM. Always use your most recent med list.          acetaminophen 500 MG tablet Commonly known as:  TYLENOL Take 500 mg by mouth every 6 (six) hours as needed for mild pain, moderate pain, fever or headache.   ADVAIR DISKUS 250-50 MCG/DOSE Aepb Generic drug:  Fluticasone-Salmeterol INHALE 1 PUFF INTO THE LUNGS TWICE DAILY   albuterol 108 (90 Base) MCG/ACT inhaler Commonly known as:  PROAIR HFA Inhale 2 puffs into the lungs every 4 (four) hours as needed.   albuterol (2.5 MG/3ML) 0.083% nebulizer solution Commonly known as:  PROVENTIL Take 3 mLs (2.5 mg total) by nebulization every 6 (six) hours as needed for wheezing or shortness of breath.   amLODipine 5 MG tablet Commonly known as:  NORVASC Take 1 tablet (5 mg total) by mouth daily.   aspirin EC 81 MG tablet Take 81 mg by mouth daily.   cetirizine 10 MG tablet Commonly known as:  ZYRTEC Take 1 tablet (10 mg total) by mouth daily.   DALIRESP 500 MCG Tabs tablet Generic drug:  roflumilast TAKE 1 TABLET BY MOUTH EVERY DAY   diazepam 5 MG tablet Commonly known as:  VALIUM TAKE 1 TABLET BY MOUTH EVERY MORNING AND 2 TABLETS EVERY NIGHT AT BEDTIME   EYE DROPS OP Apply to eye. SootheXP  fluticasone 50 MCG/ACT nasal spray Commonly known as:  FLONASE Place 1 spray into both nostrils daily.   hyoscyamine 0.125 MG SL tablet Commonly known as:  LEVSIN SL 1-2 tabs po q6h prn lower abd  pain and urgent loose stools   nitrofurantoin (macrocrystal-monohydrate) 100 MG capsule Commonly known as:  MACROBID Take 1 capsule (100 mg total) by mouth 2 (two) times daily.   PROBIOTIC DAILY PO Take 2 capsules by mouth daily.   SPIRIVA HANDIHALER 18 MCG inhalation capsule Generic drug:  tiotropium INHALE 1 CAPSULE VIA HANDIHALER EVERY DAY AT THE SAME TIME   SYNTHROID 88 MCG tablet Generic drug:  levothyroxine TAKE 1 TABLET BY MOUTH DAILY   triamcinolone cream 0.1 % Commonly known as:  KENALOG Apply 1 application topically 2 (two) times daily.   venlafaxine 50 MG tablet Commonly known as:  EFFEXOR TAKE 1 TABLET(50 MG) BY MOUTH TWICE DAILY   XIIDRA 5 % Soln Generic drug:  Lifitegrast Place 1 drop into both eyes 2 (two) times daily.       All past medical history, surgical history, allergies, family history, immunizations andmedications were updated in the EMR today and reviewed under the history and medication portions of their EMR.     ROS: Negative, with the exception of above mentioned in HPI   Objective:  BP (!) 142/76 (BP Location: Right Arm, Patient Position: Sitting, Cuff Size: Normal)   Pulse 71   Temp 97.8 F (36.6 C) (Oral)   Wt 138 lb 6.4 oz (62.8 kg)   SpO2 95%   BMI 27.03 kg/m  Body mass index is 27.03 kg/m. Gen: Afebrile. No acute distress. Nontoxic in appearance, well developed, well nourished.  HENT: AT. [place]. MMM. Eyes:Pupils Equal Round Reactive to light, Extraocular movements intact,  Conjunctiva without redness, discharge or icterus. CV: RRR  Abd: Soft.NTND. BS present MSK: No CVA tenderness Neuro: Normal gait. PERLA. EOMi. Alert. Oriented x3   No exam data present No results found. Results for orders placed or performed in visit on 02/13/17 (from the past 24 hour(s))  POCT Urinalysis Dipstick (Automated)     Status: Abnormal   Collection Time: 02/13/17  2:01 PM  Result Value Ref Range   Color, UA yellow    Clarity, UA cloudy     Glucose, UA negative    Bilirubin, UA negative    Ketones, UA negative    Spec Grav, UA 1.020 1.010 - 1.025   Blood, UA small    pH, UA 6.0 5.0 - 8.0   Protein, UA 30mg     Urobilinogen, UA 0.2 0.2 or 1.0 E.U./dL   Nitrite, UA negative    Leukocytes, UA Large (3+) (A) Negative    Assessment/Plan: Priscilla Cantrell is a 82 y.o. female present for OV for  Urinary frequency/abnl urine - Pt with recent UTI, tx with macrobid, uncertain if complete resolution. No recent uro notes/results available.  - POCT Urinalysis Dipstick (Automated)--> looks infectious and is comparable to last noted UTI in our system. Of note: that infection was Proteus and resistant to macrobid.  - Urine Culture Sent - Discussed options with pt. Her complaints today have been ongoing since Thanksgiving. We certainly would not want to add abx unless absolutely neccessary given her Chronic C.diff. She was provided with a script today of macrobid for her to hold and use over the weekend if worsening only (fever, pain etc). Otherwise, we will wait for culture results.  - encouraged her to increase her probiotic per GI instructions  if abx is used.  - OTC AZO can be used short term. Plenty of Engineer, water.  - AVS instructions on S/S on pyelonephritis for her education only and when to seek urgent tx.  - F/U dependent on culture results. If urine culture negative, I would suggest she see her urologist for further evaluation.    Reviewed expectations re: course of current medical issues.  Discussed self-management of symptoms.  Outlined signs and symptoms indicating need for more acute intervention.  Patient verbalized understanding and all questions were answered.  Patient received an After-Visit Summary.    Orders Placed This Encounter  Procedures  . Urine Culture  . POCT Urinalysis Dipstick (Automated)     Note is dictated utilizing voice recognition software. Although note has been proof read prior to  signing, occasional typographical errors still can be missed. If any questions arise, please do not hesitate to call for verification.   electronically signed by:  Howard Pouch, DO  [place]

## 2017-02-13 NOTE — Patient Instructions (Addendum)
If you have to start the antibiotic, please increase your probiotic suggested by your gastroenterologist.  Drink plenty of water and/or cranberry. I will provide you with a script, but do not start unless infectious symptoms we discussed today present. Otherwise wait until you hear from [place] Monday on culture results.    Pyelonephritis, Adult Pyelonephritis is a kidney infection. The kidneys are organs that help clean your blood by moving waste out of your blood and into your pee (urine). This infection can happen quickly, or it can last for a long time. In most cases, it clears up with treatment and does not cause other problems. Follow these instructions at home: Medicines  Take over-the-counter and prescription medicines only as told by your doctor.  Take your antibiotic medicine as told by your doctor. Do not stop taking the medicine even if you start to feel better. General instructions  Drink enough fluid to keep your pee clear or pale yellow.  Avoid caffeine, tea, and carbonated drinks.  Pee (urinate) often. Avoid holding in pee for long periods of time.  Pee before and after sex.  After pooping (having a bowel movement), women should wipe from front to back. Use each tissue only once.  Keep all follow-up visits as told by your doctor. This is important. Contact a doctor if:  You do not feel better after 2 days.  Your symptoms get worse.  You have a fever. Get help right away if:  You cannot take your medicine or drink fluids as told.  You have chills and shaking.  You throw up (vomit).  You have very bad pain in your side (flank) or back.  You feel very weak or you pass out (faint). This information is not intended to replace advice given to you by your health care provider. Make sure you discuss any questions you have with your health care provider. Document Released: 02/28/2004 Document Revised: 06/28/2015 Document Reviewed: 05/15/2014 Elsevier Interactive  Patient Education  [employer].

## 2017-02-16 ENCOUNTER — Telehealth: Payer: Self-pay | Admitting: Family Medicine

## 2017-02-16 LAB — URINE CULTURE
MICRO NUMBER:: 90049249
SPECIMEN QUALITY:: ADEQUATE

## 2017-02-16 MED ORDER — CIPROFLOXACIN HCL 250 MG PO TABS: 250.0000 mg | ORAL_TABLET | Freq: Two times a day (BID) | ORAL | 0 refills | Status: DC

## 2017-02-16 NOTE — Telephone Encounter (Signed)
Please call pt: - her urine culture is positive for a bacteria called Klebsiella. The script she was given to hold until being contacted, will not work on this bacteria.  - In order to treat her UTI she will need another abx. She his chronic C.Diff and allergies to abx, which makes this very complicated for appropriate tx.  - We should try to use an abx that is least likley to cause worsening C.diff. Unfortunatly, that is on her allergy list.  - Our choices are bactrim, keflex or Cipro.    - Please ask her the "allergy" reaction  to sulfa ? If a true allergy to sulfa would need to use cipro to treat .  - I would also recommend she call her GI to inform him of need of abx.

## 2017-02-16 NOTE — Telephone Encounter (Signed)
Spoke with patient she states she cant tolerate Sulfa drugs it gives her severe abdominal pain she thinks since its been so long ago she is unsure but doesn't want to take the chance of having a severe reaction. Patient will contact her GI Dr and inform them the need to take antibiotic. She is also requesting we sent copy of results to Dr Alyson Ingles her urologist.

## 2017-02-16 NOTE — Telephone Encounter (Signed)
Cipro 250 mg BID prescribed x3 days. Please fax culture results to her urologist. - pt was asked to inform her GI of need of abx.  - discussed case with her PCP.

## 2017-02-16 NOTE — Telephone Encounter (Signed)
Patient aware culture results faxed to Dr Alyson Ingles [facility].

## 2017-03-04 ENCOUNTER — Encounter: Payer: Self-pay | Admitting: Family Medicine

## 2017-03-04 ENCOUNTER — Ambulatory Visit (INDEPENDENT_AMBULATORY_CARE_PROVIDER_SITE_OTHER): Payer: Medicare Other | Admitting: Family Medicine

## 2017-03-04 VITALS — BP 146/75 | HR 88 | Temp 99.2°F | Resp 16 | Ht 60.0 in | Wt 138.5 lb

## 2017-03-04 DIAGNOSIS — J069 Acute upper respiratory infection, unspecified: Secondary | ICD-10-CM

## 2017-03-04 DIAGNOSIS — J449 Chronic obstructive pulmonary disease, unspecified: Secondary | ICD-10-CM | POA: Diagnosis not present

## 2017-03-04 DIAGNOSIS — B9789 Other viral agents as the cause of diseases classified elsewhere: Secondary | ICD-10-CM

## 2017-03-04 MED ORDER — PREDNISONE 20 MG PO TABS: ORAL_TABLET | ORAL | 0 refills | Status: DC

## 2017-03-04 NOTE — Progress Notes (Signed)
OFFICE VISIT  03/04/2017   CC:  Chief Complaint  Patient presents with  . Cough   HPI:    Patient is a 82 y.o. Caucasian female with COPD with hx of oxygen requirement who presents for cough. Onset 3 d/a, started off with a "hacky" cough--"like an allergy cough".  Started new laundry detergent (Gain)--caused itching and cough started.  Now coughing up some PND.  Flonase recently started by her pulm MD has helped her nasal congestion. No change in level of SOB, wheezing.  No fevers.  Some frontal HA today.  Ribs have been hurting in mid R back region, soreness on/off X 3 wks, mild itching as well (??).  No hx of shingles in that area. Occ tingly feeling in the area.  She cannot see the area to see if there is any rash currently.    Past Medical History:  Diagnosis Date  . Asthma   . Chest pain, non-cardiac    Cardiac CT showed no signif obstructive dz, mildly elevated calcium level (Dr. [name]).  . Chronic cystitis with hematuria    [name]  . Chronic hypoxemic respiratory failure ([place]) 02/02/2015   2L oxygen 24/7  . COPD (chronic obstructive pulmonary disease) ([place])    spirometry 01/10/09 FEV 0.97(52%), FEV1% 47  . DDD (degenerative disc disease), cervical    1998 MRI C5-6 impingemt (left)  . DDD (degenerative disc disease), lumbar   . Depression with anxiety 04/15/2011  . Diarrheal disease Summer 2017   ? 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).  As of 10/05/15 pt reports GI dx'd her with C diff and rx'd more flagyl and she is also on cholestyramine.  As of 02/2016, pt's sx's resolved completely.  . Diverticulosis of colon    Procto '97 and colonoscopy 2002  . Dysplastic nevus of upper extremity 04/2014   R tricep ([name])  . Family history of adverse reaction to anesthesia    mother died when patient age 70 receiving anesthesia for thyroid surgery  .  Fibrocystic breast disease    w/fibroadenoma.  Bx's showed NO atypia (Dr. Margot Chimes).  Mammo neg 2008.  Marland Kitchen GERD (gastroesophageal reflux disease)   . History of double vision    Ophthalmologist, Dr. Herbert Deaner, is further evaluating this with MRI  orbits and limited brain (myesthenia gravis testing neg 07/2010)  . History of home oxygen therapy    uses 2 liters at hs  . Hypertension    EKG 03/2010 normal  . Hypothyroidism    Hashimoto's, dx'd 1989  . Hypoxemia    O2 86% RA with exertion 01/10/09, 2 liters oxygen with exertion and sleep  . Lesion of right native kidney 2017   found on CT 02/2015.  F/u MRI 03/31/15 showed it to be smaller and less likely of any significance but repeat CT renal protocol in 13mo was recommended by radiology.  This was done 10/26/15 and showed no interval change in the lesion, but b/c the lesion enhances with contrast, radiol suggested urol consultation (b/c papillary renal cell ca could not be excluded).  Saw urol 03/06/16--stable on u/s 09/2016  . Osteoarthritis, hip, bilateral    End stage; R THA  . Postmenopausal atrophic vaginitis    improved with estrace 2 X/week.  . Pulmonary nodule    CT chest 12/10, June 2011.  No nodule on CT 06/2010.  Marland Kitchen Recurrent Clostridium difficile diarrhea    Episode 09/19/2016:  resolved with prolonged course of flagyl.  11/2016 recurrence treated with 10d of Dificid (Fidaxomicin) by GI (Dr. Earlean Shawl).  . Recurrent UTI    likely related to pt's atrophic vaginitis.  Urol started vaginal estrace 03/2016.  Marland Kitchen Shingles 02/2014   R abd/flank  . 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 Upper airway cough syndrome     Past Surgical History:  Procedure Laterality Date  . ABDOMINAL HYSTERECTOMY  1978   no BSO per pt--nonmalignant reasons  . APPENDECTOMY    . BREAST CYST EXCISION     Last screening mammogram 10/2010 was normal.  . CHOLECYSTECTOMY  2001  . COLONOSCOPY  04/2005; 12/06/2015   2007  NORMAL.  2017 adenomatous polyp x 1.  Mod sigmoid diverticulosis (Dr. Earlean Shawl).    Marland Kitchen HIP CLOSED REDUCTION Right 12/25/2015   Procedure: CLOSED REDUCTION HIP;  Surgeon: Nicholes Stairs, MD;  Location: [facility];  Service: Orthopedics;  Laterality: Right;  . TONSILLECTOMY    . TOTAL HIP ARTHROPLASTY Right 11/01/2014   Procedure: RIGHT TOTAL HIP ARTHROPLASTY;  Surgeon: Latanya Maudlin, MD;  Location: [facility];  Service: Orthopedics;  Laterality: Right;  . TRANSTHORACIC ECHOCARDIOGRAM  08/2014   EF 60-65%, grade I diast dysfxn  . TUBAL LIGATION  1974    Outpatient Medications Prior to Visit  Medication Sig Dispense Refill  . acetaminophen (TYLENOL) 500 MG tablet Take 500 mg by mouth every 6 (six) hours as needed for mild pain, moderate pain, fever or headache.    . ADVAIR DISKUS 250-50 MCG/DOSE AEPB INHALE 1 PUFF INTO THE LUNGS TWICE DAILY 1 each 2  . albuterol (PROAIR HFA) 108 (90 Base) MCG/ACT inhaler Inhale 2 puffs into the lungs every 4 (four) hours as needed. 1 Inhaler 5  . albuterol (PROVENTIL) (2.5 MG/3ML) 0.083% nebulizer solution Take 3 mLs (2.5 mg total) by nebulization every 6 (six) hours as needed for wheezing or shortness of breath. 75 mL 5  . amLODipine (NORVASC) 5 MG tablet Take 1 tablet (5 mg total) by mouth daily. 90 tablet 3  . aspirin EC 81 MG tablet Take 81 mg by mouth daily.    . cetirizine (ZYRTEC) 10 MG tablet Take 1 tablet (10 mg total) by mouth daily. (Patient taking differently: Take 10-20 mg by mouth daily as needed for allergies. ) 30 tablet 11  . DALIRESP 500 MCG TABS tablet TAKE 1 TABLET BY MOUTH EVERY DAY 90 tablet 1  . diazepam (VALIUM) 5 MG tablet TAKE 1 TABLET BY MOUTH EVERY MORNING AND 2 TABLETS EVERY NIGHT AT BEDTIME 90 tablet 5  . estradiol (ESTRACE) 0.1 MG/GM vaginal cream APPLY 1/2 INCH RIBBON VAGINALLY QHS FOR 14 DAYS THEN TWICE WEEKLY    . fluticasone (FLONASE) 50 MCG/ACT nasal spray Place 1 spray into both nostrils daily. 16 g 2  . hyoscyamine (LEVSIN SL)  0.125 MG SL tablet 1-2 tabs po q6h prn lower abd pain and urgent loose stools 30 tablet 3  . Lifitegrast (XIIDRA) 5 % SOLN Place 1 drop into both eyes 2 (two) times daily.    . Probiotic Product (PROBIOTIC DAILY PO) Take 2 capsules by mouth daily.     Marland Kitchen SPIRIVA HANDIHALER 18 MCG inhalation capsule INHALE 1 CAPSULE VIA HANDIHALER EVERY DAY AT THE SAME TIME 30 capsule 0  . SYNTHROID 88 MCG tablet TAKE 1 TABLET BY MOUTH DAILY 30 tablet 5  . Tetrahydrozoline HCl (EYE DROPS OP) Apply to eye. SootheXP    .  triamcinolone cream (KENALOG) 0.1 % Apply 1 application topically 2 (two) times daily. 30 g 0  . venlafaxine (EFFEXOR) 50 MG tablet TAKE 1 TABLET(50 MG) BY MOUTH TWICE DAILY 180 tablet 3  . ciprofloxacin (CIPRO) 250 MG tablet Take 1 tablet (250 mg total) by mouth 2 (two) times daily. (Patient not taking: Reported on 03/04/2017) 6 tablet 0   No facility-administered medications prior to visit.     Allergies  Allergen Reactions  . Losartan Other (See Comments)    hyperkalemia  . Penicillins Hives, Swelling and Rash    Has patient had a PCN reaction causing immediate rash, facial/tongue/throat swelling, SOB or lightheadedness with hypotension: YES Has patient had a PCN reaction causing severe rash involving mucus membranes or skin necrosis: NO Has patient had a PCN reaction that required hospitalization NO Has patient had a PCN reaction occurring within the last 10 years: NO If all of the above answers are "NO", then may proceed with Cephalosporin use.   . Cefixime [Kdc:Cefixime] Other (See Comments)    unspecified  . Indocin [Indomethacin] Other (See Comments)    Painful tongue and throat  . Sulfa Antibiotics Other (See Comments)    unspecified  . Ace Inhibitors Other (See Comments)    cough  . Statins Other (See Comments)    Myalgias     ROS As per HPI  PE: Blood pressure (!) 146/75, pulse 88, temperature 99.2 F (37.3 C), temperature source Oral, resp. rate 16, height 5' (1.524  m), weight 138 lb 8 oz (62.8 kg), SpO2 92 %. VS: noted--normal. Gen: alert, NAD, NONTOXIC APPEARING. HEENT: eyes without injection, drainage, or swelling.  Ears: EACs clear, TMs with normal light reflex and landmarks.  Nose: Clear rhinorrhea, with some dried, crusty exudate adherent to mildly injected mucosa.  No purulent d/c.  No paranasal sinus TTP.  No facial swelling.  Throat and mouth without focal lesion.  No pharyngial swelling, erythema, or exudate.   Neck: supple, no LAD.   LUNGS: CTA bilat, nonlabored resps.   CV: RRR, no m/r/g. EXT: no c/c/e SKIN: no rash (R back region checked as well)    LABS:  None today  IMPRESSION AND PLAN:  1) Viral URI with PND cough. No sign of COPD exacerbation or bacterial infection at this time. However, given her propensity for getting COPD flares after/during a URI, I printed out/handed her a prednisone rx with the following instructions:  Fill prednisone rx and start taking it IF you start to feel like a COPD flare up is starting over the next 5-10 days. If you have to start this medication and it is not helping after 2d, then return for recheck.  An After Visit Summary was printed and given to the patient.  FOLLOW UP: Return if symptoms worsen or fail to improve.  Signed:  Crissie Sickles, MD           03/04/2017

## 2017-03-04 NOTE — Patient Instructions (Signed)
Fill prednisone rx and start taking it IF you start to feel like a COPD flare up is starting over the next 5-10 days. If you have to start this medication and it is not helping after 2d, then return for recheck.

## 2017-03-06 ENCOUNTER — Ambulatory Visit: Payer: Self-pay | Admitting: *Deleted

## 2017-03-06 NOTE — Telephone Encounter (Signed)
Patient notified and verbalized understanding. 

## 2017-03-06 NOTE — Telephone Encounter (Signed)
Pt  Was   Seen   sev  Days  Ago by  Dr Anitra Lauth   Pt  Was   rx  Prednisone  And  Has  Taken  2  Of them  Pt   Is  Asking  Is  It  Asking   If  She  Can take  The  Nebulizer  As  Directed .  Pt is    Seeking  Advice   From  Dr  Anitra Lauth . Answer Assessment - Initial Assessment Questions 1. SYMPTOMS: "Do you have any symptoms?"       Short  Of  Breath   On  Exertion   Last  Night had  Some  Wheezing about 4  Am   Got  Better   On  Its   On     Had  Coughing  Episode  As   Well   About [place]  Now Pt  Has   Copd  And  Chronic  Bronchitis   Pt has  A  Nebulizer  Machine  With  Albuterol  Has  Not  Had to  Use  It lately she  Also  Has  A  Hand held  Emergency  Albuterol  Used  It last  Pm  And it  Helped  Pt   Reports she  Was  Better      2. SEVERITY: If symptoms are present, ask "Are they mild, moderate or severe?"     Pt  Is  Better  Now  Protocols used: MEDICATION QUESTION CALL-A-AH

## 2017-03-06 NOTE — Telephone Encounter (Signed)
Yes, I definitely encourage her to take albuterol nebulizer treatment every 4 hours as needed for wheezing, chest tightness, or shortness of breath. No o/v needed at this time.

## 2017-03-08 DIAGNOSIS — J180 Bronchopneumonia, unspecified organism: Secondary | ICD-10-CM | POA: Diagnosis not present

## 2017-03-11 ENCOUNTER — Ambulatory Visit (INDEPENDENT_AMBULATORY_CARE_PROVIDER_SITE_OTHER): Payer: Medicare Other | Admitting: Family Medicine

## 2017-03-11 ENCOUNTER — Encounter: Payer: Self-pay | Admitting: Family Medicine

## 2017-03-11 VITALS — BP 144/75 | HR 65 | Temp 97.6°F | Resp 16 | Wt 138.0 lb

## 2017-03-11 DIAGNOSIS — J441 Chronic obstructive pulmonary disease with (acute) exacerbation: Secondary | ICD-10-CM

## 2017-03-11 DIAGNOSIS — R0689 Other abnormalities of breathing: Secondary | ICD-10-CM | POA: Diagnosis not present

## 2017-03-11 DIAGNOSIS — J069 Acute upper respiratory infection, unspecified: Secondary | ICD-10-CM | POA: Diagnosis not present

## 2017-03-11 MED ORDER — PREDNISONE 20 MG PO TABS: ORAL_TABLET | ORAL | 0 refills | Status: DC

## 2017-03-11 MED ORDER — AZELASTINE HCL 0.1 % NA SOLN: 2.0000 | Freq: Two times a day (BID) | NASAL | 3 refills | Status: DC

## 2017-03-11 NOTE — Progress Notes (Signed)
OFFICE VISIT  03/11/2017   CC:  Chief Complaint  Patient presents with  . Cough    Head and chest congestion, shortness of breath   HPI:    Patient is a 82 y.o. Caucasian female who presents for cough. I saw her 8 d/a for URI and at that time she was not having any sx's of COPD exacerbation. I gave her a prednisone 10d taper rx to hold and fill if COPD flare sx's started, with instructions to seek medical care if not improving any after 2 d of prednisone use. She presented to [facility] in [place] 03/08/17.  At that time she had finished her steroids, VS stable except RA sat was 81%.  Sat on 2L oxygen was 97% (24/7 during last 5d or so). She was dx'd with bronchopneumonia, rx'd a Z pack.  Ongoing SOB but better today, has been wearing her oxygen 2L for the last 4 days.  Cough and wheeze, still present.  Sx's worse at night.  Feels very glogged in nose, PND as well. Has three days of 20mg  qd prednisone left.  No fevers. She has rationed her albuterol neb sol'n the last 3 d b/c only had one dose left, which she used this morning.  No n/v/d.  Past Medical History:  Diagnosis Date  . Asthma   . Chest pain, non-cardiac    Cardiac CT showed no signif obstructive dz, mildly elevated calcium level (Dr. [name]).  . Chronic cystitis with hematuria    [name]  . Chronic hypoxemic respiratory failure ([place]) 02/02/2015   2L oxygen 24/7  . COPD (chronic obstructive pulmonary disease) ([place])    spirometry 01/10/09 FEV 0.97(52%), FEV1% 47  . DDD (degenerative disc disease), cervical    1998 MRI C5-6 impingemt (left)  . DDD (degenerative disc disease), lumbar   . Depression with anxiety 04/15/2011  . Diarrheal disease Summer 2017   ? 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).  As of 10/05/15 pt reports GI dx'd her with C diff and rx'd more flagyl and she is also on  cholestyramine.  As of 02/2016, pt's sx's resolved completely.  . Diverticulosis of colon    Procto '97 and colonoscopy 2002  . Dysplastic nevus of upper extremity 04/2014   R tricep ([name])  . Family history of adverse reaction to anesthesia    mother died when patient age 16 receiving anesthesia for thyroid surgery  . Fibrocystic breast disease    w/fibroadenoma.  Bx's showed NO atypia (Dr. Margot Chimes).  Mammo neg 2008.  Marland Kitchen GERD (gastroesophageal reflux disease)   . History of double vision    Ophthalmologist, Dr. Herbert Deaner, is further evaluating this with MRI  orbits and limited brain (myesthenia gravis testing neg 07/2010)  . History of home oxygen therapy    uses 2 liters at hs  . Hypertension    EKG 03/2010 normal  . Hypothyroidism    Hashimoto's, dx'd 1989  . Hypoxemia    O2 86% RA with exertion 01/10/09, 2 liters oxygen with exertion and sleep  . Lesion of right native kidney 2017   found on CT 02/2015.  F/u MRI 03/31/15 showed it to be smaller and less likely of any significance but repeat CT renal protocol in 60mo was recommended by radiology.  This was done 10/26/15 and showed no interval change in the lesion, but b/c the lesion enhances with contrast, radiol suggested urol  consultation (b/c papillary renal cell ca could not be excluded).  Saw urol 03/06/16--stable on u/s 09/2016  . Osteoarthritis, hip, bilateral    End stage; R THA  . Postmenopausal atrophic vaginitis    improved with estrace 2 X/week.  . Pulmonary nodule    CT chest 12/10, June 2011.  No nodule on CT 06/2010.  Marland Kitchen Recurrent Clostridium difficile diarrhea    Episode 09/19/2016: resolved with prolonged course of flagyl.  11/2016 recurrence treated with 10d of Dificid (Fidaxomicin) by GI (Dr. Earlean Shawl).  . Recurrent UTI    likely related to pt's atrophic vaginitis.  Urol started vaginal estrace 03/2016.  Marland Kitchen Shingles 02/2014   R abd/flank  . Spinal stenosis of lumbar region with neurogenic claudication    2008 MRI (L3-4, L4-5,  L5-S1)  . Upper airway cough syndrome     Past Surgical History:  Procedure Laterality Date  . ABDOMINAL HYSTERECTOMY  1978   no BSO per pt--nonmalignant reasons  . APPENDECTOMY    . BREAST CYST EXCISION     Last screening mammogram 10/2010 was normal.  . CHOLECYSTECTOMY  2001  . COLONOSCOPY  04/2005; 12/06/2015   2007 NORMAL.  2017 adenomatous polyp x 1.  Mod sigmoid diverticulosis (Dr. Earlean Shawl).    Marland Kitchen HIP CLOSED REDUCTION Right 12/25/2015   Procedure: CLOSED REDUCTION HIP;  Surgeon: Nicholes Stairs, MD;  Location: [facility];  Service: Orthopedics;  Laterality: Right;  . TONSILLECTOMY    . TOTAL HIP ARTHROPLASTY Right 11/01/2014   Procedure: RIGHT TOTAL HIP ARTHROPLASTY;  Surgeon: Latanya Maudlin, MD;  Location: [facility];  Service: Orthopedics;  Laterality: Right;  . TRANSTHORACIC ECHOCARDIOGRAM  08/2014   EF 60-65%, grade I diast dysfxn  . TUBAL LIGATION  1974    Outpatient Medications Prior to Visit  Medication Sig Dispense Refill  . acetaminophen (TYLENOL) 500 MG tablet Take 500 mg by mouth every 6 (six) hours as needed for mild pain, moderate pain, fever or headache.    . ADVAIR DISKUS 250-50 MCG/DOSE AEPB INHALE 1 PUFF INTO THE LUNGS TWICE DAILY 1 each 2  . albuterol (PROAIR HFA) 108 (90 Base) MCG/ACT inhaler Inhale 2 puffs into the lungs every 4 (four) hours as needed. 1 Inhaler 5  . albuterol (PROVENTIL) (2.5 MG/3ML) 0.083% nebulizer solution Take 3 mLs (2.5 mg total) by nebulization every 6 (six) hours as needed for wheezing or shortness of breath. 75 mL 5  . amLODipine (NORVASC) 5 MG tablet Take 1 tablet (5 mg total) by mouth daily. 90 tablet 3  . aspirin EC 81 MG tablet Take 81 mg by mouth daily.    Marland Kitchen azithromycin (ZITHROMAX) 250 MG tablet   0  . cetirizine (ZYRTEC) 10 MG tablet Take 1 tablet (10 mg total) by mouth daily. (Patient taking differently: Take 10-20 mg by mouth daily as needed for allergies. ) 30 tablet 11  . DALIRESP 500 MCG TABS tablet TAKE 1 TABLET BY MOUTH EVERY  DAY 90 tablet 1  . diazepam (VALIUM) 5 MG tablet TAKE 1 TABLET BY MOUTH EVERY MORNING AND 2 TABLETS EVERY NIGHT AT BEDTIME 90 tablet 5  . estradiol (ESTRACE) 0.1 MG/GM vaginal cream APPLY 1/2 INCH RIBBON VAGINALLY QHS FOR 14 DAYS THEN TWICE WEEKLY    . fluticasone (FLONASE) 50 MCG/ACT nasal spray Place 1 spray into both nostrils daily. 16 g 2  . hyoscyamine (LEVSIN SL) 0.125 MG SL tablet 1-2 tabs po q6h prn lower abd pain and urgent loose stools 30 tablet 3  . Lifitegrast (  XIIDRA) 5 % SOLN Place 1 drop into both eyes 2 (two) times daily.    . Probiotic Product (PROBIOTIC DAILY PO) Take 2 capsules by mouth daily.     Marland Kitchen SPIRIVA HANDIHALER 18 MCG inhalation capsule INHALE 1 CAPSULE VIA HANDIHALER EVERY DAY AT THE SAME TIME 30 capsule 0  . SYNTHROID 88 MCG tablet TAKE 1 TABLET BY MOUTH DAILY 30 tablet 5  . Tetrahydrozoline HCl (EYE DROPS OP) Apply to eye. SootheXP    . triamcinolone cream (KENALOG) 0.1 % Apply 1 application topically 2 (two) times daily. 30 g 0  . venlafaxine (EFFEXOR) 50 MG tablet TAKE 1 TABLET(50 MG) BY MOUTH TWICE DAILY 180 tablet 3  . predniSONE (DELTASONE) 20 MG tablet 2 tabs po qd x 5d, then 1 tab po qd x 5d 15 tablet 0   No facility-administered medications prior to visit.     Allergies  Allergen Reactions  . Losartan Other (See Comments)    hyperkalemia  . Penicillins Hives, Swelling and Rash    Has patient had a PCN reaction causing immediate rash, facial/tongue/throat swelling, SOB or lightheadedness with hypotension: YES Has patient had a PCN reaction causing severe rash involving mucus membranes or skin necrosis: NO Has patient had a PCN reaction that required hospitalization NO Has patient had a PCN reaction occurring within the last 10 years: NO If all of the above answers are "NO", then may proceed with Cephalosporin use.   . Cefixime [Kdc:Cefixime] Other (See Comments)    unspecified  . Indocin [Indomethacin] Other (See Comments)    Painful tongue and  throat  . Sulfa Antibiotics Other (See Comments)    unspecified  . Ace Inhibitors Other (See Comments)    cough  . Statins Other (See Comments)    Myalgias     ROS As per HPI  PE: Blood pressure (!) 144/75, pulse 65, temperature 97.6 F (36.4 C), temperature source Oral, resp. rate 16, weight 138 lb (62.6 kg), SpO2 93 %.RA VS: noted--normal. Gen: alert, NAD, NONTOXIC APPEARING. HEENT: eyes without injection, drainage, or swelling.  Ears: EACs clear, TMs with normal light reflex and landmarks.  Nose: Clear rhinorrhea, with some dried, crusty exudate adherent to mildly injected mucosa.  No purulent d/c.  No paranasal sinus TTP.  No facial swelling.  Throat and mouth without focal lesion.  No pharyngial swelling, erythema, or exudate.   Neck: supple, no LAD.   LUNGS: CTA bilat on inspiration, with some bronchial breath sounds in L base.  Trace exp wheezing, mildly prolonged exp phase.  Nonlabored resps. CV: RRR, no m/r/g. EXT: no c/c/e SKIN: no rash    LABS:    Chemistry      Component Value Date/Time   NA 141 09/29/2016 1013   K 4.0 09/29/2016 1013   CL 106 09/29/2016 1013   CO2 29 09/29/2016 1013   BUN 14 09/29/2016 1013   CREATININE 0.72 09/29/2016 1013   CREATININE 0.84 02/27/2016 1533      Component Value Date/Time   CALCIUM 9.5 09/29/2016 1013   ALKPHOS 77 09/29/2016 1013   AST 15 09/29/2016 1013   ALT 12 09/29/2016 1013   BILITOT 0.6 09/29/2016 1013      IMPRESSION AND PLAN:  COPD exacerbation, with question of acute bacterial bronchopneumonia. Slow to improve.  CXR today. Prednisone 40mg  qd x 5d, then 20mg  qd x 5d. Finish azith as rx'd by UC MD. Continue 2L oxygen at home 24/7.  An After Visit Summary was printed and given  to the patient.  FOLLOW UP: Return in about 5 days (around 03/16/2017) for copd flare.  Signed:  Crissie Sickles, MD           03/11/2017

## 2017-03-12 ENCOUNTER — Ambulatory Visit (HOSPITAL_BASED_OUTPATIENT_CLINIC_OR_DEPARTMENT_OTHER)
Admission: RE | Admit: 2017-03-12 | Discharge: 2017-03-12 | Disposition: A | Discharge status: Home / Self Care | Payer: Medicare Other | Source: Ambulatory Visit | Attending: Family Medicine | Admitting: Family Medicine

## 2017-03-12 DIAGNOSIS — J449 Chronic obstructive pulmonary disease, unspecified: Secondary | ICD-10-CM | POA: Insufficient documentation

## 2017-03-12 DIAGNOSIS — J441 Chronic obstructive pulmonary disease with (acute) exacerbation: Secondary | ICD-10-CM | POA: Diagnosis present

## 2017-03-12 DIAGNOSIS — R05 Cough: Secondary | ICD-10-CM | POA: Diagnosis not present

## 2017-03-12 DIAGNOSIS — R0689 Other abnormalities of breathing: Secondary | ICD-10-CM | POA: Diagnosis present

## 2017-03-12 DIAGNOSIS — R062 Wheezing: Secondary | ICD-10-CM | POA: Diagnosis not present

## 2017-03-13 ENCOUNTER — Other Ambulatory Visit: Payer: Self-pay | Admitting: Pulmonary Disease

## 2017-03-16 ENCOUNTER — Ambulatory Visit: Payer: Medicare Other | Admitting: Family Medicine

## 2017-03-18 ENCOUNTER — Ambulatory Visit (INDEPENDENT_AMBULATORY_CARE_PROVIDER_SITE_OTHER): Payer: Medicare Other | Admitting: Family Medicine

## 2017-03-18 ENCOUNTER — Encounter: Payer: Self-pay | Admitting: Family Medicine

## 2017-03-18 VITALS — BP 146/72 | HR 65 | Temp 97.5°F | Resp 16 | Wt 141.0 lb

## 2017-03-18 DIAGNOSIS — J441 Chronic obstructive pulmonary disease with (acute) exacerbation: Secondary | ICD-10-CM

## 2017-03-18 DIAGNOSIS — J069 Acute upper respiratory infection, unspecified: Secondary | ICD-10-CM | POA: Diagnosis not present

## 2017-03-18 NOTE — Progress Notes (Signed)
OFFICE VISIT  03/18/2017   CC:  Chief Complaint  Patient presents with  . COPD   HPI:    Patient is a 82 y.o. Caucasian female who presents for 7 day f/u COPD exacerbation. Last visit I extended her prednisone taper and checked a CXR.  Feeling some improvement last 1-2 d.   Robitussin DM and albuterol q4h prn for cough---says cough feels coming from upper airway, not chest. Much less wheezing.  Using 2L oxygen during day lately to help feeling of mild SOB, plus wears it when sleeping. Today, she has not felt SOB and has not taken a neb or used her oxygen.  No fevers. Some PND is there--a bit cloudy white.  Using flonase, saline nasal spray, and astelin nasal spray.  CXR 03/13/17: IMPRESSION: Chronic findings of COPD. Interstitial coarsening appears increased from 2017, question COPD exacerbation/bronchitis. No focal pneumonia.  Past Medical History:  Diagnosis Date  . Asthma   . Chest pain, non-cardiac    Cardiac CT showed no signif obstructive dz, mildly elevated calcium level (Dr. [name]).  . Chronic cystitis with hematuria    [name]  . Chronic hypoxemic respiratory failure ([place]) 02/02/2015   2L oxygen 24/7  . COPD (chronic obstructive pulmonary disease) ([place])    spirometry 01/10/09 FEV 0.97(52%), FEV1% 47  . DDD (degenerative disc disease), cervical    1998 MRI C5-6 impingemt (left)  . DDD (degenerative disc disease), lumbar   . Depression with anxiety 04/15/2011  . Diarrheal disease Summer 2017   ? 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).  As of 10/05/15 pt reports GI dx'd her with C diff and rx'd more flagyl and she is also on cholestyramine.  As of 02/2016, pt's sx's resolved completely.  . Diverticulosis of colon    Procto '97 and colonoscopy 2002  . Dysplastic nevus of upper extremity 04/2014   R tricep ([name])  . Family history of adverse  reaction to anesthesia    mother died when patient age 33 receiving anesthesia for thyroid surgery  . Fibrocystic breast disease    w/fibroadenoma.  Bx's showed NO atypia (Dr. Margot Chimes).  Mammo neg 2008.  Marland Kitchen GERD (gastroesophageal reflux disease)   . History of double vision    Ophthalmologist, Dr. Herbert Deaner, is further evaluating this with MRI  orbits and limited brain (myesthenia gravis testing neg 07/2010)  . History of home oxygen therapy    uses 2 liters at hs  . Hypertension    EKG 03/2010 normal  . Hypothyroidism    Hashimoto's, dx'd 1989  . Hypoxemia    O2 86% RA with exertion 01/10/09, 2 liters oxygen with exertion and sleep  . Lesion of right native kidney 2017   found on CT 02/2015.  F/u MRI 03/31/15 showed it to be smaller and less likely of any significance but repeat CT renal protocol in 68mo was recommended by radiology.  This was done 10/26/15 and showed no interval change in the lesion, but b/c the lesion enhances with contrast, radiol suggested urol consultation (b/c papillary renal cell ca could not be excluded).  Saw urol 03/06/16--stable on u/s 09/2016  . Osteoarthritis, hip, bilateral    End stage; R THA  . Postmenopausal atrophic vaginitis    improved with estrace 2 X/week.  . Pulmonary nodule    CT chest 12/10, June 2011.  No nodule on CT 06/2010.  Marland Kitchen Recurrent Clostridium difficile diarrhea  Episode 09/19/2016: resolved with prolonged course of flagyl.  11/2016 recurrence treated with 10d of Dificid (Fidaxomicin) by GI (Dr. Earlean Shawl).  . Recurrent UTI    likely related to pt's atrophic vaginitis.  Urol started vaginal estrace 03/2016.  Marland Kitchen Shingles 02/2014   R abd/flank  . 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 Upper airway cough syndrome     Past Surgical History:  Procedure Laterality Date  . ABDOMINAL HYSTERECTOMY  1978   no BSO per pt--nonmalignant reasons  . APPENDECTOMY    . BREAST CYST EXCISION     Last  screening mammogram 10/2010 was normal.  . CHOLECYSTECTOMY  2001  . COLONOSCOPY  04/2005; 12/06/2015   2007 NORMAL.  2017 adenomatous polyp x 1.  Mod sigmoid diverticulosis (Dr. Earlean Shawl).    Marland Kitchen HIP CLOSED REDUCTION Right 12/25/2015   Procedure: CLOSED REDUCTION HIP;  Surgeon: Nicholes Stairs, MD;  Location: [facility];  Service: Orthopedics;  Laterality: Right;  . TONSILLECTOMY    . TOTAL HIP ARTHROPLASTY Right 11/01/2014   Procedure: RIGHT TOTAL HIP ARTHROPLASTY;  Surgeon: Latanya Maudlin, MD;  Location: [facility];  Service: Orthopedics;  Laterality: Right;  . TRANSTHORACIC ECHOCARDIOGRAM  08/2014   EF 60-65%, grade I diast dysfxn  . TUBAL LIGATION  1974    Outpatient Medications Prior to Visit  Medication Sig Dispense Refill  . acetaminophen (TYLENOL) 500 MG tablet Take 500 mg by mouth every 6 (six) hours as needed for mild pain, moderate pain, fever or headache.    . ADVAIR DISKUS 250-50 MCG/DOSE AEPB INHALE 1 PUFF INTO THE LUNGS TWICE DAILY 1 each 2  . albuterol (PROAIR HFA) 108 (90 Base) MCG/ACT inhaler Inhale 2 puffs into the lungs every 4 (four) hours as needed. 1 Inhaler 5  . albuterol (PROVENTIL) (2.5 MG/3ML) 0.083% nebulizer solution Take 3 mLs (2.5 mg total) by nebulization every 6 (six) hours as needed for wheezing or shortness of breath. 75 mL 5  . amLODipine (NORVASC) 5 MG tablet Take 1 tablet (5 mg total) by mouth daily. 90 tablet 3  . aspirin EC 81 MG tablet Take 81 mg by mouth daily.    Marland Kitchen azelastine (ASTELIN) 0.1 % nasal spray Place 2 sprays into both nostrils 2 (two) times daily. Use in each nostril as directed 30 mL 3  . cetirizine (ZYRTEC) 10 MG tablet Take 1 tablet (10 mg total) by mouth daily. (Patient taking differently: Take 10-20 mg by mouth daily as needed for allergies. ) 30 tablet 11  . DALIRESP 500 MCG TABS tablet TAKE 1 TABLET BY MOUTH EVERY DAY 90 tablet 1  . diazepam (VALIUM) 5 MG tablet TAKE 1 TABLET BY MOUTH EVERY MORNING AND 2 TABLETS EVERY NIGHT AT BEDTIME 90 tablet  5  . estradiol (ESTRACE) 0.1 MG/GM vaginal cream APPLY 1/2 INCH RIBBON VAGINALLY QHS FOR 14 DAYS THEN TWICE WEEKLY    . fluticasone (FLONASE) 50 MCG/ACT nasal spray Place 1 spray into both nostrils daily. 16 g 2  . hyoscyamine (LEVSIN SL) 0.125 MG SL tablet 1-2 tabs po q6h prn lower abd pain and urgent loose stools 30 tablet 3  . Lifitegrast (XIIDRA) 5 % SOLN Place 1 drop into both eyes 2 (two) times daily.    . predniSONE (DELTASONE) 20 MG tablet 2 tabs po qd x 5d, then 1 tab po qd x 5d 15 tablet 0  . Probiotic Product (PROBIOTIC DAILY PO) Take 2 capsules by mouth daily.     Marland Kitchen  SPIRIVA HANDIHALER 18 MCG inhalation capsule INHALE 1 CAPSULE VIA HANDIHALER EVERY DAY AT THE SAME TIME 30 capsule 0  . SYNTHROID 88 MCG tablet TAKE 1 TABLET BY MOUTH DAILY 30 tablet 5  . Tetrahydrozoline HCl (EYE DROPS OP) Apply to eye. SootheXP    . triamcinolone cream (KENALOG) 0.1 % Apply 1 application topically 2 (two) times daily. 30 g 0  . venlafaxine (EFFEXOR) 50 MG tablet TAKE 1 TABLET(50 MG) BY MOUTH TWICE DAILY 180 tablet 3  . azithromycin (ZITHROMAX) 250 MG tablet   0  . SPIRIVA HANDIHALER 18 MCG inhalation capsule INHALE 1 CAPSULE VIA HANDIHALER EVERY DAY AT THE SAME TIME 30 capsule 0   No facility-administered medications prior to visit.     Allergies  Allergen Reactions  . Losartan Other (See Comments)    hyperkalemia  . Penicillins Hives, Swelling and Rash    Has patient had a PCN reaction causing immediate rash, facial/tongue/throat swelling, SOB or lightheadedness with hypotension: YES Has patient had a PCN reaction causing severe rash involving mucus membranes or skin necrosis: NO Has patient had a PCN reaction that required hospitalization NO Has patient had a PCN reaction occurring within the last 10 years: NO If all of the above answers are "NO", then may proceed with Cephalosporin use.   . Cefixime [Kdc:Cefixime] Other (See Comments)    unspecified  . Indocin [Indomethacin] Other (See  Comments)    Painful tongue and throat  . Sulfa Antibiotics Other (See Comments)    unspecified  . Ace Inhibitors Other (See Comments)    cough  . Statins Other (See Comments)    Myalgias     ROS As per HPI  PE: Blood pressure (!) 146/72, pulse 65, temperature (!) 97.5 F (36.4 C), temperature source Oral, resp. rate 16, weight 141 lb (64 kg), SpO2 92 %.Room Air Gen: Alert, well appearing.  Patient is oriented to person, place, time, and situation. AFFECT: pleasant, lucid thought and speech. ENT: Ears: EACs clear, normal epithelium.  TMs with good light reflex and landmarks bilaterally.  Eyes: no injection, icteris, swelling, or exudate.  EOMI, PERRLA. Nose: no drainage or turbinate edema/swelling.  No injection or focal lesion.  Mouth: lips without lesion/swelling.  Oral mucosa pink and moist.  Dentition intact and without obvious caries or gingival swelling.  Oropharynx without erythema, exudate, or swelling.  Neck - No masses or thyromegaly or limitation in range of motion CV: RRR, distant S1 and S2, no m/r/g LUNGS: CTA bilat, BS good, has bronchial BS in mid and upper lung fields bilat. Nonlabored resps. No clubbing or cyanosis.  LABS:    Chemistry      Component Value Date/Time   NA 141 09/29/2016 1013   K 4.0 09/29/2016 1013   CL 106 09/29/2016 1013   CO2 29 09/29/2016 1013   BUN 14 09/29/2016 1013   CREATININE 0.72 09/29/2016 1013   CREATININE 0.84 02/27/2016 1533      Component Value Date/Time   CALCIUM 9.5 09/29/2016 1013   ALKPHOS 77 09/29/2016 1013   AST 15 09/29/2016 1013   ALT 12 09/29/2016 1013   BILITOT 0.6 09/29/2016 1013      IMPRESSION AND PLAN:  URI with COPD exacerbation. She is improving now. Upper airway cough is her biggest issue at this time. Will simplify treatments some-- Instructions: Change cough med to generic delsym. Stop flonase and astelin nasal sprays for now. Continue saline nasal spray (ocean) 3-4 times per day. Finish  steroids. Continue albuterol  nebulizer treatments every 4h as needed.  An After Visit Summary was printed and given to the patient.  FOLLOW UP: Return if symptoms worsen or fail to improve.  Signed:  Crissie Sickles, MD           03/18/2017

## 2017-03-18 NOTE — Patient Instructions (Signed)
Change cough med to generic delsym. Stop flonase and astelin nasal sprays for now. Continue saline nasal spray (ocean) 3-4 times per day. Finish steroids. Continue albuterol nebulizer treatments every 4h as needed.

## 2017-03-26 ENCOUNTER — Other Ambulatory Visit: Payer: Self-pay | Admitting: Family Medicine

## 2017-04-06 ENCOUNTER — Encounter: Payer: Self-pay | Admitting: Adult Health

## 2017-04-06 ENCOUNTER — Ambulatory Visit (INDEPENDENT_AMBULATORY_CARE_PROVIDER_SITE_OTHER): Payer: Medicare Other | Admitting: Adult Health

## 2017-04-06 ENCOUNTER — Ambulatory Visit (INDEPENDENT_AMBULATORY_CARE_PROVIDER_SITE_OTHER)
Admission: RE | Admit: 2017-04-06 | Discharge: 2017-04-06 | Disposition: A | Discharge status: Home / Self Care | Payer: Medicare Other | Source: Ambulatory Visit | Attending: Adult Health | Admitting: Adult Health

## 2017-04-06 VITALS — BP 124/82 | HR 74 | Temp 97.7°F | Ht 60.0 in | Wt 142.4 lb

## 2017-04-06 DIAGNOSIS — J449 Chronic obstructive pulmonary disease, unspecified: Secondary | ICD-10-CM

## 2017-04-06 DIAGNOSIS — R05 Cough: Secondary | ICD-10-CM | POA: Diagnosis not present

## 2017-04-06 DIAGNOSIS — N952 Postmenopausal atrophic vaginitis: Secondary | ICD-10-CM

## 2017-04-06 MED ORDER — LEVALBUTEROL HCL 0.63 MG/3ML IN NEBU
0.6300 mg | INHALATION_SOLUTION | Freq: Once | RESPIRATORY_TRACT | Status: AC
  Administered 2017-04-06: 0.63 mg via RESPIRATORY_TRACT

## 2017-04-06 NOTE — Patient Instructions (Addendum)
Chest xray today .  Mucinex DM Twice daily  As needed  Cough/congestion.  Continue on Advair Spiriva and Daliresp May use albuterol nebulizer as needed for wheezing and congestion. Saline nasal spray as needed. Continue on Zyrtec daily Continue on Astelin nasal spray Follow-up with [name]  in 6-8 weeks and as needed Please contact office for sooner follow up if symptoms do not improve or worsen or seek emergency care

## 2017-04-06 NOTE — Assessment & Plan Note (Signed)
Slow to resolve flare  Check cxr today  Hold on abx and steroids at this time  Control for triggers  Plan  Patient Instructions  Chest xray today .  Mucinex DM Twice daily  As needed  Cough/congestion.  Continue on Advair Spiriva and Daliresp May use albuterol nebulizer as needed for wheezing and congestion. Saline nasal spray as needed. Continue on Zyrtec daily Continue on Astelin nasal spray Follow-up with [name]  in 6-8 weeks and as needed Please contact office for sooner follow up if symptoms do not improve or worsen or seek emergency care

## 2017-04-06 NOTE — Assessment & Plan Note (Signed)
Cont on o2 At bedtime   

## 2017-04-06 NOTE — Progress Notes (Signed)
@Patient  ID: Priscilla Cantrell, female    DOB: [birth year], 82 y.o.   MRN: [id number]  Chief Complaint  Patient presents with  . Acute Visit    COPD     Referring provider: [name], MD  HPI: 82 year old female former smoker followed for gold for COPD, ACE related cough and nocturnal hypoxemia  Pulmonary tests Spirometry 01/10/09>>FEV1 0.97(52%), FEV1% 47 March 2012>>Daliresp started  Sleep tests PSG 11/14/11 >> AHI 0.3 ONO with RA 07/31/16 >>test time 6 hrs 41 min. Average SpO2 87%, low SpO2 74%. Spent 4 hrs 13 min with SpO2 <88%.  Cardiac tests Echo 08/30/14 >> EF 60 to 30%, grade 1 diastolic dysfx, PAS 24 mmHG  04/06/2017 Acute OV : COPD  Patient presents for an acute office visit.  Complains of 1 month of cough, congestion  And wheezing .  Seen by PCP initially , dx with bronchitis . She was treated ZPack and steroid taper initially .  CXR was done with COPD changes. Has some improvement but still has ongoing cough and wheezing . Still has some nasal drainage . Mucus is clear.  She is on Advair Twice daily  , Daliresp  And Spiriva .   She remains on O2 At bedtime  .      Allergies  Allergen Reactions  . Losartan Other (See Comments)    hyperkalemia  . Penicillins Hives, Swelling and Rash    Has patient had a PCN reaction causing immediate rash, facial/tongue/throat swelling, SOB or lightheadedness with hypotension: YES Has patient had a PCN reaction causing severe rash involving mucus membranes or skin necrosis: NO Has patient had a PCN reaction that required hospitalization NO Has patient had a PCN reaction occurring within the last 10 years: NO If all of the above answers are "NO", then may proceed with Cephalosporin use.   . Cefixime [Kdc:Cefixime] Other (See Comments)    unspecified  . Indocin [Indomethacin] Other (See Comments)    Painful tongue and throat  . Sulfa Antibiotics Other (See Comments)    unspecified  . Ace Inhibitors Other (See  Comments)    cough  . Statins Other (See Comments)    Myalgias     Immunization History  Administered Date(s) Administered  . Influenza Split 11/04/2010, 10/23/2011  . Influenza Whole 10/04/2009  . Influenza, High Dose Seasonal PF 11/07/2016  . Influenza,inj,Quad PF,6+ Mos 10/07/2012, 10/11/2014  . Influenza-Unspecified 11/07/2013, 11/01/2015  . Pneumococcal Conjugate-13 04/29/2013  . Pneumococcal Polysaccharide-23 02/03/2005, 11/28/2010  . Td 02/04/2008, 03/07/2010  . Zoster 02/10/2013  . Zoster Recombinat (Shingrix) 10/30/2016, 12/29/2016    Past Medical History:  Diagnosis Date  . Asthma   . Chest pain, non-cardiac    Cardiac CT showed no signif obstructive dz, mildly elevated calcium level (Dr. [name]).  . Chronic cystitis with hematuria    [name]  . Chronic hypoxemic respiratory failure ([place]) 02/02/2015   2L oxygen 24/7  . COPD (chronic obstructive pulmonary disease) ([place])    spirometry 01/10/09 FEV 0.97(52%), FEV1% 47  . DDD (degenerative disc disease), cervical    1998 MRI C5-6 impingemt (left)  . DDD (degenerative disc disease), lumbar   . Depression with anxiety 04/15/2011  . Diarrheal disease Summer 2017   ? 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).  As of 10/05/15 pt reports GI dx'd her with C diff and rx'd more flagyl and she is also on cholestyramine.  As of  02/2016, pt's sx's resolved completely.  . Diverticulosis of colon    Procto '97 and colonoscopy 2002  . Dysplastic nevus of upper extremity 04/2014   R tricep ([name])  . Family history of adverse reaction to anesthesia    mother died when patient age 26 receiving anesthesia for thyroid surgery  . Fibrocystic breast disease    w/fibroadenoma.  Bx's showed NO atypia (Dr. Margot Chimes).  Mammo neg 2008.  Marland Kitchen GERD (gastroesophageal reflux disease)   . History of double vision    Ophthalmologist,  Dr. Herbert Deaner, is further evaluating this with MRI  orbits and limited brain (myesthenia gravis testing neg 07/2010)  . History of home oxygen therapy    uses 2 liters at hs  . Hypertension    EKG 03/2010 normal  . Hypothyroidism    Hashimoto's, dx'd 1989  . Hypoxemia    O2 86% RA with exertion 01/10/09, 2 liters oxygen with exertion and sleep  . Lesion of right native kidney 2017   found on CT 02/2015.  F/u MRI 03/31/15 showed it to be smaller and less likely of any significance but repeat CT renal protocol in 60mo was recommended by radiology.  This was done 10/26/15 and showed no interval change in the lesion, but b/c the lesion enhances with contrast, radiol suggested urol consultation (b/c papillary renal cell ca could not be excluded).  Saw urol 03/06/16--stable on u/s 09/2016  . Osteoarthritis, hip, bilateral    End stage; R THA  . Postmenopausal atrophic vaginitis    improved with estrace 2 X/week.  . Pulmonary nodule    CT chest 12/10, June 2011.  No nodule on CT 06/2010.  Marland Kitchen Recurrent Clostridium difficile diarrhea    Episode 09/19/2016: resolved with prolonged course of flagyl.  11/2016 recurrence treated with 10d of Dificid (Fidaxomicin) by GI (Dr. Earlean Shawl).  . Recurrent UTI    likely related to pt's atrophic vaginitis.  Urol started vaginal estrace 03/2016.  Marland Kitchen Shingles 02/2014   R abd/flank  . 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 Upper airway cough syndrome     Tobacco History: Social History   Tobacco Use  Smoking Status Former Smoker  . Packs/day: 2.00  . Years: 35.00  . Pack years: 70.00  . Types: Cigarettes  . Last attempt to quit: 02/03/1990  . Years since quitting: 27.1  Smokeless Tobacco Never Used   Counseling given: Not Answered   Outpatient Encounter Medications as of 04/06/2017  Medication Sig  . acetaminophen (TYLENOL) 500 MG tablet Take 500 mg by mouth every 6 (six) hours as needed for mild pain, moderate  pain, fever or headache.  . ADVAIR DISKUS 250-50 MCG/DOSE AEPB INHALE 1 PUFF INTO THE LUNGS TWICE DAILY  . albuterol (PROAIR HFA) 108 (90 Base) MCG/ACT inhaler Inhale 2 puffs into the lungs every 4 (four) hours as needed.  Marland Kitchen amLODipine (NORVASC) 5 MG tablet Take 1 tablet (5 mg total) by mouth daily.  Marland Kitchen aspirin EC 81 MG tablet Take 81 mg by mouth daily.  . cetirizine (ZYRTEC) 10 MG tablet Take 1 tablet (10 mg total) by mouth daily. (Patient taking differently: Take 10-20 mg by mouth daily as needed for allergies. )  . DALIRESP 500 MCG TABS tablet TAKE 1 TABLET BY MOUTH EVERY DAY  . diazepam (VALIUM) 5 MG tablet TAKE 1 TABLET BY MOUTH EVERY MORNING AND 2 TABLETS EVERY NIGHT AT BEDTIME  . fluticasone (FLONASE) 50 MCG/ACT  nasal spray Place 1 spray into both nostrils daily.  . hyoscyamine (LEVSIN SL) 0.125 MG SL tablet 1-2 tabs po q6h prn lower abd pain and urgent loose stools  . Lactobacillus (PROBIOTIC ACIDOPHILUS) CAPS Take 2 capsules by mouth daily.  . Probiotic Product (PROBIOTIC DAILY PO) Take 2 capsules by mouth daily.   Marland Kitchen SPIRIVA HANDIHALER 18 MCG inhalation capsule INHALE 1 CAPSULE VIA HANDIHALER EVERY DAY AT THE SAME TIME  . SYNTHROID 88 MCG tablet TAKE 1 TABLET BY MOUTH DAILY  . Tetrahydrozoline HCl (EYE DROPS OP) Apply to eye. SootheXP  . triamcinolone cream (KENALOG) 0.1 % Apply 1 application topically 2 (two) times daily.  Marland Kitchen venlafaxine (EFFEXOR) 50 MG tablet TAKE 1 TABLET(50 MG) BY MOUTH TWICE DAILY  . albuterol (PROVENTIL) (2.5 MG/3ML) 0.083% nebulizer solution Take 3 mLs (2.5 mg total) by nebulization every 6 (six) hours as needed for wheezing or shortness of breath.  Marland Kitchen azelastine (ASTELIN) 0.1 % nasal spray Place 2 sprays into both nostrils 2 (two) times daily. Use in each nostril as directed (Patient not taking: Reported on 04/06/2017)  . estradiol (ESTRACE) 0.1 MG/GM vaginal cream APPLY 1/2 INCH RIBBON VAGINALLY QHS FOR 14 DAYS THEN TWICE WEEKLY  . Lifitegrast (XIIDRA) 5 % SOLN Place  1 drop into both eyes 2 (two) times daily.  . [DISCONTINUED] predniSONE (DELTASONE) 20 MG tablet 2 tabs po qd x 5d, then 1 tab po qd x 5d (Patient not taking: Reported on 04/06/2017)  . [EXPIRED] levalbuterol (XOPENEX) nebulizer solution 0.63 mg    No facility-administered encounter medications on file as of 04/06/2017.      Review of Systems  Constitutional:   No  weight loss, night sweats,  Fevers, chills, fatigue, or  lassitude.  HEENT:   No headaches,  Difficulty swallowing,  Tooth/dental problems, or  Sore throat,                No sneezing, itching, ear ache,  +nasal congestion, post nasal drip,   CV:  No chest pain,  Orthopnea, PND, swelling in lower extremities, anasarca, dizziness, palpitations, syncope.   GI  No heartburn, indigestion, abdominal pain, nausea, vomiting, diarrhea, change in bowel habits, loss of appetite, bloody stools.   Resp: .  No chest wall deformity  Skin: no rash or lesions.  GU: no dysuria, change in color of urine, no urgency or frequency.  No flank pain, no hematuria   MS:  No joint pain or swelling.  No decreased range of motion.  No back pain.    Physical Exam  BP 124/82 (BP Location: Left Arm, Cuff Size: Normal)   Pulse 74   Temp 97.7 F (36.5 C) (Oral)   Ht 5' (1.524 m)   Wt 142 lb 6.4 oz (64.6 kg)   SpO2 92%   BMI 27.81 kg/m   GEN: A/Ox3; pleasant , NAD , elderly    HEENT:  [place]/AT,  EACs-clear, TMs-wnl, NOSE-clear, THROAT-clear, no lesions, no postnasal drip or exudate noted.   NECK:  Supple w/ fair ROM; no JVD; normal carotid impulses w/o bruits; no thyromegaly or nodules palpated; no lymphadenopathy.    RESP  Clear  P & A; w/o, wheezes/ rales/ or rhonchi. no accessory muscle use, no dullness to percussion  CARD:  RRR, no m/r/g, no peripheral edema, pulses intact, no cyanosis or clubbing.  GI:   Soft & nt; nml bowel sounds; no organomegaly or masses detected.   Musco: Warm bil, no deformities or joint swelling noted.   Neuro:  alert, no focal deficits noted.    Skin: Warm, no lesions or rashes    Lab Results:  CBC    Component Value Date/Time   WBC 6.2 09/29/2016 1013   RBC 4.85 09/29/2016 1013   HGB 14.0 09/29/2016 1013   HCT 42.9 09/29/2016 1013   PLT 284.0 09/29/2016 1013   MCV 88.6 09/29/2016 1013   MCH 27.7 07/01/2015 1730   MCHC 32.6 09/29/2016 1013   RDW 14.3 09/29/2016 1013   LYMPHSABS 1.6 09/29/2016 1013   MONOABS 0.5 09/29/2016 1013   EOSABS 0.1 09/29/2016 1013   BASOSABS 0.0 09/29/2016 1013    BMET    Component Value Date/Time   NA 141 09/29/2016 1013   K 4.0 09/29/2016 1013   CL 106 09/29/2016 1013   CO2 29 09/29/2016 1013   GLUCOSE 86 09/29/2016 1013   BUN 14 09/29/2016 1013   CREATININE 0.72 09/29/2016 1013   CREATININE 0.84 02/27/2016 1533   CALCIUM 9.5 09/29/2016 1013   GFRNONAA >60 07/01/2015 1730   GFRAA >60 07/01/2015 1730    BNP    Component Value Date/Time   BNP 78.4 01/24/2015 1217    ProBNP No results found for: PROBNP  Imaging: Dg Chest 2 View  Result Date: 03/13/2017 CLINICAL DATA:  Cough and wheezing.  Smoker. EXAM: CHEST  2 VIEW COMPARISON:  02/20/2015 FINDINGS: Chronic hyperinflation. There is interstitial coarsening that is chronic but may have increased at both bases. No focal pneumonia or collapse. No effusion or edema. Borderline heart size. Stable mild aortic tortuosity. IMPRESSION: Chronic findings of COPD. Interstitial coarsening appears increased from 2017, question COPD exacerbation/bronchitis. No focal pneumonia. Electronically Signed   By: Monte Fantasia M.D.   On: 03/13/2017 08:46     Assessment & Plan:   COPD with chronic bronchitis Slow to resolve flare  Check cxr today  Hold on abx and steroids at this time  Control for triggers  Plan  Patient Instructions  Chest xray today .  Mucinex DM Twice daily  As needed  Cough/congestion.  Continue on Advair Spiriva and Daliresp May use albuterol nebulizer as needed for wheezing and  congestion. Saline nasal spray as needed. Continue on Zyrtec daily Continue on Astelin nasal spray Follow-up with [name]  in 6-8 weeks and as needed Please contact office for sooner follow up if symptoms do not improve or worsen or seek emergency care       Atrophic vaginitis Cont on o2 At bedtime       Rexene Edison, NP 04/06/2017

## 2017-04-07 DIAGNOSIS — H04123 Dry eye syndrome of bilateral lacrimal glands: Secondary | ICD-10-CM | POA: Diagnosis not present

## 2017-04-07 DIAGNOSIS — H16229 Keratoconjunctivitis sicca, not specified as Sjogren's, unspecified eye: Secondary | ICD-10-CM | POA: Diagnosis not present

## 2017-04-07 NOTE — Progress Notes (Signed)
Reviewed and agree with assessment/plan.   Jodeci Roarty, MD [facility] 01/30/2016, 12:24 PM Pager:  [phone]  

## 2017-04-09 ENCOUNTER — Other Ambulatory Visit: Payer: Self-pay | Admitting: Pulmonary Disease

## 2017-04-26 ENCOUNTER — Other Ambulatory Visit: Payer: Self-pay | Admitting: Pulmonary Disease

## 2017-05-05 ENCOUNTER — Telehealth: Payer: Self-pay | Admitting: Pulmonary Disease

## 2017-05-05 NOTE — Telephone Encounter (Signed)
Called and spoke with pt regarding RX Daliresp 563mcg tabs Pt advised that pt was prescribed Daliresp from [facility]. pt said that it is over $1200 pt wants something else prescribed  Pt's insurance plan changed first of the year 2019; pt states her responsibility would be to pay $1200.00  [facility] please advise if there is a different medication pt could have instead of Daliresp

## 2017-05-05 NOTE — Telephone Encounter (Signed)
There isn't an effective alternative to daliresp.  Can you check with the pharmacy rep about whether there is a patient assistance program.

## 2017-05-06 ENCOUNTER — Ambulatory Visit (INDEPENDENT_AMBULATORY_CARE_PROVIDER_SITE_OTHER): Payer: Medicare Other | Admitting: Family Medicine

## 2017-05-06 ENCOUNTER — Encounter: Payer: Self-pay | Admitting: Family Medicine

## 2017-05-06 VITALS — BP 116/67 | HR 74 | Temp 97.6°F | Resp 16 | Ht 60.0 in | Wt 140.4 lb

## 2017-05-06 DIAGNOSIS — J449 Chronic obstructive pulmonary disease, unspecified: Secondary | ICD-10-CM

## 2017-05-06 DIAGNOSIS — J069 Acute upper respiratory infection, unspecified: Secondary | ICD-10-CM

## 2017-05-06 DIAGNOSIS — J301 Allergic rhinitis due to pollen: Secondary | ICD-10-CM | POA: Diagnosis not present

## 2017-05-06 DIAGNOSIS — B9789 Other viral agents as the cause of diseases classified elsewhere: Secondary | ICD-10-CM | POA: Diagnosis not present

## 2017-05-06 MED ORDER — BENZONATATE 200 MG PO CAPS: 200.0000 mg | ORAL_CAPSULE | Freq: Two times a day (BID) | ORAL | 0 refills | Status: DC | PRN

## 2017-05-06 MED ORDER — PREDNISONE 20 MG PO TABS: ORAL_TABLET | ORAL | 0 refills | Status: DC

## 2017-05-06 NOTE — Progress Notes (Signed)
OFFICE VISIT  05/06/2017   CC:  Chief Complaint  Patient presents with  . URI   HPI:    Patient is a 82 y.o. Caucasian female with a history of recurrent c diff infection who presents for respiratory complaints.  Onset of more mucous and phlegm in nose and throat about 2 wks ago.  Slight PND cough until last night and her cough increased quite a bit.  No signif increase in her cough during daytime.  No change in baseline SOB with walking.  Nose not runny.  Some face pain upon awakening this morning, went away with heat pack.  No HA, no ST.  No fever. The mucous is thick and green.  She is compliant with allerg rhin sx's and copd meds. Also using some saline nasal spray lately.  She has not tried any albuterol with this illness. No diarrhea, no abd pain.   Past Medical History:  Diagnosis Date  . Asthma   . Chest pain, non-cardiac    Cardiac CT showed no signif obstructive dz, mildly elevated calcium level (Dr. [name]).  . Chronic cystitis with hematuria    [name]  . Chronic hypoxemic respiratory failure ([place]) 02/02/2015   2L oxygen 24/7  . COPD (chronic obstructive pulmonary disease) ([place])    spirometry 01/10/09 FEV 0.97(52%), FEV1% 47  . DDD (degenerative disc disease), cervical    1998 MRI C5-6 impingemt (left)  . DDD (degenerative disc disease), lumbar   . Depression with anxiety 04/15/2011  . Diarrheal disease Summer 2017   ? 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).  As of 10/05/15 pt reports GI dx'd her with C diff and rx'd more flagyl and she is also on cholestyramine.  As of 02/2016, pt's sx's resolved completely.  . Diverticulosis of colon    Procto '97 and colonoscopy 2002  . Dysplastic nevus of upper extremity 04/2014   R tricep ([name])  . Family history of adverse reaction to anesthesia    mother died when patient age 11 receiving anesthesia for  thyroid surgery  . Fibrocystic breast disease    w/fibroadenoma.  Bx's showed NO atypia (Dr. Margot Chimes).  Mammo neg 2008.  Marland Kitchen GERD (gastroesophageal reflux disease)   . History of double vision    Ophthalmologist, Dr. Herbert Deaner, is further evaluating this with MRI  orbits and limited brain (myesthenia gravis testing neg 07/2010)  . History of home oxygen therapy    uses 2 liters at hs  . Hypertension    EKG 03/2010 normal  . Hypothyroidism    Hashimoto's, dx'd 1989  . Hypoxemia    O2 86% RA with exertion 01/10/09, 2 liters oxygen with exertion and sleep  . Lesion of right native kidney 2017   found on CT 02/2015.  F/u MRI 03/31/15 showed it to be smaller and less likely of any significance but repeat CT renal protocol in 6mo was recommended by radiology.  This was done 10/26/15 and showed no interval change in the lesion, but b/c the lesion enhances with contrast, radiol suggested urol consultation (b/c papillary renal cell ca could not be excluded).  Saw urol 03/06/16--stable on u/s 09/2016  . Osteoarthritis, hip, bilateral    End stage; R THA  . Postmenopausal atrophic vaginitis    improved with estrace 2 X/week.  . Pulmonary nodule    CT chest 12/10, June 2011.  No nodule on CT 06/2010.  Marland Kitchen Recurrent Clostridium  difficile diarrhea    Episode 09/19/2016: resolved with prolonged course of flagyl.  11/2016 recurrence treated with 10d of Dificid (Fidaxomicin) by GI (Dr. Earlean Shawl).  . Recurrent UTI    likely related to pt's atrophic vaginitis.  Urol started vaginal estrace 03/2016.  Marland Kitchen Shingles 02/2014   R abd/flank  . 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 Upper airway cough syndrome     Past Surgical History:  Procedure Laterality Date  . ABDOMINAL HYSTERECTOMY  1978   no BSO per pt--nonmalignant reasons  . APPENDECTOMY    . BREAST CYST EXCISION     Last screening mammogram 10/2010 was normal.  . CHOLECYSTECTOMY  2001  . COLONOSCOPY  04/2005;  12/06/2015   2007 NORMAL.  2017 adenomatous polyp x 1.  Mod sigmoid diverticulosis (Dr. Earlean Shawl).    Marland Kitchen HIP CLOSED REDUCTION Right 12/25/2015   Procedure: CLOSED REDUCTION HIP;  Surgeon: Nicholes Stairs, MD;  Location: [facility];  Service: Orthopedics;  Laterality: Right;  . TONSILLECTOMY    . TOTAL HIP ARTHROPLASTY Right 11/01/2014   Procedure: RIGHT TOTAL HIP ARTHROPLASTY;  Surgeon: Latanya Maudlin, MD;  Location: [facility];  Service: Orthopedics;  Laterality: Right;  . TRANSTHORACIC ECHOCARDIOGRAM  08/2014   EF 60-65%, grade I diast dysfxn  . TUBAL LIGATION  1974    Outpatient Medications Prior to Visit  Medication Sig Dispense Refill  . acetaminophen (TYLENOL) 500 MG tablet Take 500 mg by mouth every 6 (six) hours as needed for mild pain, moderate pain, fever or headache.    . ADVAIR DISKUS 250-50 MCG/DOSE AEPB INHALE 1 PUFF INTO THE LUNGS TWICE DAILY 1 each 2  . albuterol (PROAIR HFA) 108 (90 Base) MCG/ACT inhaler Inhale 2 puffs into the lungs every 4 (four) hours as needed. 1 Inhaler 5  . amLODipine (NORVASC) 5 MG tablet Take 1 tablet (5 mg total) by mouth daily. 90 tablet 3  . aspirin EC 81 MG tablet Take 81 mg by mouth daily.    Marland Kitchen azelastine (ASTELIN) 0.1 % nasal spray Place 2 sprays into both nostrils 2 (two) times daily. Use in each nostril as directed 30 mL 3  . cetirizine (ZYRTEC) 10 MG tablet Take 1 tablet (10 mg total) by mouth daily. (Patient taking differently: Take 10-20 mg by mouth daily as needed for allergies. ) 30 tablet 11  . DALIRESP 500 MCG TABS tablet TAKE 1 TABLET BY MOUTH EVERY DAY 90 tablet 0  . diazepam (VALIUM) 5 MG tablet TAKE 1 TABLET BY MOUTH EVERY MORNING AND 2 TABLETS EVERY NIGHT AT BEDTIME 90 tablet 5  . estradiol (ESTRACE) 0.1 MG/GM vaginal cream APPLY 1/2 INCH RIBBON VAGINALLY QHS FOR 14 DAYS THEN TWICE WEEKLY    . fluticasone (FLONASE) 50 MCG/ACT nasal spray Place 1 spray into both nostrils daily. 16 g 2  . hyoscyamine (LEVSIN SL) 0.125 MG SL tablet 1-2 tabs  po q6h prn lower abd pain and urgent loose stools 30 tablet 3  . Lactobacillus (PROBIOTIC ACIDOPHILUS) CAPS Take 2 capsules by mouth daily.    Marland Kitchen Lifitegrast (XIIDRA) 5 % SOLN Place 1 drop into both eyes 2 (two) times daily.    . Probiotic Product (PROBIOTIC DAILY PO) Take 2 capsules by mouth daily.     Marland Kitchen SPIRIVA HANDIHALER 18 MCG inhalation capsule INHALE 1 CAPSULE VIA HANDIHALER EVERY DAY AT THE SAME TIME 30 capsule 6  . SYNTHROID 88 MCG tablet TAKE 1 TABLET BY MOUTH  DAILY 30 tablet 5  . Tetrahydrozoline HCl (EYE DROPS OP) Apply to eye. SootheXP    . triamcinolone cream (KENALOG) 0.1 % Apply 1 application topically 2 (two) times daily. 30 g 0  . venlafaxine (EFFEXOR) 50 MG tablet TAKE 1 TABLET(50 MG) BY MOUTH TWICE DAILY 180 tablet 3  . albuterol (PROVENTIL) (2.5 MG/3ML) 0.083% nebulizer solution Take 3 mLs (2.5 mg total) by nebulization every 6 (six) hours as needed for wheezing or shortness of breath. 75 mL 5  . SPIRIVA HANDIHALER 18 MCG inhalation capsule INHALE 1 CAPSULE VIA HANDIHALER EVERY DAY AT THE SAME TIME (Patient not taking: Reported on 05/06/2017) 30 capsule 0   No facility-administered medications prior to visit.     Allergies  Allergen Reactions  . Losartan Other (See Comments)    hyperkalemia  . Penicillins Hives, Swelling and Rash    Has patient had a PCN reaction causing immediate rash, facial/tongue/throat swelling, SOB or lightheadedness with hypotension: YES Has patient had a PCN reaction causing severe rash involving mucus membranes or skin necrosis: NO Has patient had a PCN reaction that required hospitalization NO Has patient had a PCN reaction occurring within the last 10 years: NO If all of the above answers are "NO", then may proceed with Cephalosporin use.   . Cefixime [Kdc:Cefixime] Other (See Comments)    unspecified  . Indocin [Indomethacin] Other (See Comments)    Painful tongue and throat  . Sulfa Antibiotics Other (See Comments)    unspecified  . Ace  Inhibitors Other (See Comments)    cough  . Statins Other (See Comments)    Myalgias     ROS As per HPI  PE: Blood pressure 116/67, pulse 74, temperature 97.6 F (36.4 C), temperature source Oral, resp. rate 16, height 5' (1.524 m), weight 140 lb 6 oz (63.7 kg), SpO2 95 %. VS: noted--normal. Gen: alert, NAD, NONTOXIC APPEARING. HEENT: eyes without injection, drainage, or swelling.  Ears: EACs clear, TMs with normal light reflex and landmarks.  Nose: Clear rhinorrhea, with some dried, crusty exudate adherent to mildly injected mucosa.  No purulent d/c.  No paranasal sinus TTP.  No facial swelling.  Throat and mouth without focal lesion.  No pharyngial swelling, erythema, or exudate.   Neck: supple, no LAD.   LUNGS: CTA bilat, nonlabored resps.   CV: RRR, no m/r/g. EXT: no c/c/e SKIN: no rash  LABS:    Chemistry      Component Value Date/Time   NA 141 09/29/2016 1013   K 4.0 09/29/2016 1013   CL 106 09/29/2016 1013   CO2 29 09/29/2016 1013   BUN 14 09/29/2016 1013   CREATININE 0.72 09/29/2016 1013   CREATININE 0.84 02/27/2016 1533      Component Value Date/Time   CALCIUM 9.5 09/29/2016 1013   ALKPHOS 77 09/29/2016 1013   AST 15 09/29/2016 1013   ALT 12 09/29/2016 1013   BILITOT 0.6 09/29/2016 1013     Lab Results  Component Value Date   TSH 1.65 09/29/2016    IMPRESSION AND PLAN:  Viral URI superimposed on allergic rhinitis. No sign of bacterial sinusitis. No sign of acute COPD exacerbation. Treat with tessalon pearls for cough. Prednisone 40mg  qd x 5d. Try 2 p of albuterol hs.  An After Visit Summary was printed and given to the patient.  FOLLOW UP: Return for keep appt scheduled for later this month.  Signed:  Crissie Sickles, MD           05/06/2017

## 2017-05-06 NOTE — Telephone Encounter (Signed)
[place] does offer patient [place] for Daliresp.   Spoke with patient, she is interested in applying for patient assistance. She wishes to have the application mailed to her. She will bring the application back to the office on the 13th for her appt. Verified patient's address over the phone, everything is correct.   Will mail out application today.

## 2017-05-08 ENCOUNTER — Telehealth: Payer: Self-pay | Admitting: Family Medicine

## 2017-05-08 ENCOUNTER — Other Ambulatory Visit: Payer: Self-pay | Admitting: Family Medicine

## 2017-05-08 NOTE — Telephone Encounter (Signed)
Medication sent to pharmacy by provider today.

## 2017-05-08 NOTE — Telephone Encounter (Signed)
Copied from [place]. Topic: Quick Communication - Rx Refill/Question >> May 08, 2017 11:58 AM Boyd Kerbs wrote:  Medication: albuterol (PROAIR HFA) 108 (90 Base) MCG/ACT inhaler   Has the patient contacted their pharmacy? Yes.    (Agent: If no, request that the patient contact the pharmacy for the refill.) Preferred Pharmacy (with phone number or street name):   [facility],  - [address] [place] [address] [place] [address] [place]  [address] Phone: [phone] Fax: [phone]   Agent: Please be advised that RX refills may take up to 3 business days. We ask that you follow-up with your pharmacy.

## 2017-05-10 ENCOUNTER — Encounter (HOSPITAL_BASED_OUTPATIENT_CLINIC_OR_DEPARTMENT_OTHER): Payer: Self-pay | Admitting: Emergency Medicine

## 2017-05-10 ENCOUNTER — Other Ambulatory Visit: Payer: Self-pay

## 2017-05-10 ENCOUNTER — Emergency Department (HOSPITAL_BASED_OUTPATIENT_CLINIC_OR_DEPARTMENT_OTHER)
Admission: EM | Admit: 2017-05-10 | Discharge: 2017-05-11 | Disposition: A | Discharge status: Home / Self Care | Payer: Medicare Other | Attending: Emergency Medicine | Admitting: Emergency Medicine

## 2017-05-10 DIAGNOSIS — Z87891 Personal history of nicotine dependence: Secondary | ICD-10-CM | POA: Insufficient documentation

## 2017-05-10 DIAGNOSIS — J45909 Unspecified asthma, uncomplicated: Secondary | ICD-10-CM | POA: Diagnosis not present

## 2017-05-10 DIAGNOSIS — R509 Fever, unspecified: Secondary | ICD-10-CM | POA: Diagnosis not present

## 2017-05-10 DIAGNOSIS — I10 Essential (primary) hypertension: Secondary | ICD-10-CM | POA: Diagnosis not present

## 2017-05-10 DIAGNOSIS — J069 Acute upper respiratory infection, unspecified: Secondary | ICD-10-CM | POA: Insufficient documentation

## 2017-05-10 DIAGNOSIS — J441 Chronic obstructive pulmonary disease with (acute) exacerbation: Secondary | ICD-10-CM | POA: Diagnosis not present

## 2017-05-10 DIAGNOSIS — E039 Hypothyroidism, unspecified: Secondary | ICD-10-CM | POA: Diagnosis not present

## 2017-05-10 DIAGNOSIS — R05 Cough: Secondary | ICD-10-CM | POA: Diagnosis not present

## 2017-05-10 DIAGNOSIS — Z96641 Presence of right artificial hip joint: Secondary | ICD-10-CM | POA: Diagnosis not present

## 2017-05-10 DIAGNOSIS — Z79899 Other long term (current) drug therapy: Secondary | ICD-10-CM | POA: Diagnosis not present

## 2017-05-10 DIAGNOSIS — R0602 Shortness of breath: Secondary | ICD-10-CM | POA: Diagnosis present

## 2017-05-10 DIAGNOSIS — R0989 Other specified symptoms and signs involving the circulatory and respiratory systems: Secondary | ICD-10-CM | POA: Diagnosis not present

## 2017-05-10 DIAGNOSIS — R0981 Nasal congestion: Secondary | ICD-10-CM | POA: Diagnosis not present

## 2017-05-10 DIAGNOSIS — J449 Chronic obstructive pulmonary disease, unspecified: Secondary | ICD-10-CM | POA: Insufficient documentation

## 2017-05-10 DIAGNOSIS — Z9981 Dependence on supplemental oxygen: Secondary | ICD-10-CM | POA: Insufficient documentation

## 2017-05-10 DIAGNOSIS — Z7982 Long term (current) use of aspirin: Secondary | ICD-10-CM | POA: Insufficient documentation

## 2017-05-10 NOTE — ED Triage Notes (Signed)
C/o "cold symptoms" that she felt like were "only up here" (pt motions from neck upward). States she has had 2 CXR in last two weeks. Endorses difficulty sleeping, SHOB, and feeling "tight". States she took a neb tx at home. RRT at [facility] during triage. Pt coughing up thick mucous.

## 2017-05-11 ENCOUNTER — Emergency Department (HOSPITAL_BASED_OUTPATIENT_CLINIC_OR_DEPARTMENT_OTHER): Payer: Medicare Other

## 2017-05-11 DIAGNOSIS — J449 Chronic obstructive pulmonary disease, unspecified: Secondary | ICD-10-CM | POA: Diagnosis not present

## 2017-05-11 DIAGNOSIS — R509 Fever, unspecified: Secondary | ICD-10-CM | POA: Diagnosis not present

## 2017-05-11 MED ORDER — DOXYCYCLINE HYCLATE 100 MG PO TABS
100.0000 mg | ORAL_TABLET | Freq: Once | ORAL | Status: AC
  Administered 2017-05-11: 100 mg via ORAL
  Filled 2017-05-11: qty 1

## 2017-05-11 MED ORDER — DOXYCYCLINE HYCLATE 100 MG PO CAPS: 100.0000 mg | ORAL_CAPSULE | Freq: Two times a day (BID) | ORAL | 0 refills | Status: DC

## 2017-05-11 MED ORDER — IPRATROPIUM-ALBUTEROL 0.5-2.5 (3) MG/3ML IN SOLN
3.0000 mL | Freq: Four times a day (QID) | RESPIRATORY_TRACT | Status: DC
  Administered 2017-05-11: 3 mL via RESPIRATORY_TRACT
  Filled 2017-05-11: qty 3

## 2017-05-11 NOTE — ED Notes (Signed)
Patient transported to X-ray 

## 2017-05-11 NOTE — ED Provider Notes (Signed)
[facility] DEPARTMENT Provider Note   CSN: [id number] Arrival date & time: 05/10/17  2313     History   Chief Complaint Chief Complaint  Patient presents with  . Shortness of Breath    HPI Priscilla Cantrell is a 82 y.o. female.  HPI  This is an 82 year old female with a history of COPD on 2 L O2 as needed at home and at night who presents with cough and upper respiratory congestion.  Patient reports that she was seen and evaluated by her primary physician on Thursday.  At that time she was treated with a course of prednisone, nasal saline, nasal spray.  Patient reports persistent cough and shortness of breath since that time.  She had not been using her nebulizer but did use it once earlier today with some relief.  Cough has become more productive.  She denies any fever.  She states "I can feel things running down the back of my throat."  She denies any chest pain, nausea, vomiting, diarrhea.  Denies any lower extremity swelling.  Denies any difficulty swallowing or sore throat.  Past Medical History:  Diagnosis Date  . Asthma   . Chest pain, non-cardiac    Cardiac CT showed no signif obstructive dz, mildly elevated calcium level (Dr. [name]).  . Chronic cystitis with hematuria    [name]  . Chronic hypoxemic respiratory failure ([place]) 02/02/2015   2L oxygen 24/7  . COPD (chronic obstructive pulmonary disease) ([place])    spirometry 01/10/09 FEV 0.97(52%), FEV1% 47  . DDD (degenerative disc disease), cervical    1998 MRI C5-6 impingemt (left)  . DDD (degenerative disc disease), lumbar   . Depression with anxiety 04/15/2011  . Diarrheal disease Summer 2017   ? 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).  As of 10/05/15 pt reports GI dx'd her with C diff and rx'd more flagyl and she is also on cholestyramine.  As of 02/2016, pt's sx's resolved  completely.  . Diverticulosis of colon    Procto '97 and colonoscopy 2002  . Dysplastic nevus of upper extremity 04/2014   R tricep ([name])  . Family history of adverse reaction to anesthesia    mother died when patient age 75 receiving anesthesia for thyroid surgery  . Fibrocystic breast disease    w/fibroadenoma.  Bx's showed NO atypia (Dr. Margot Chimes).  Mammo neg 2008.  Marland Kitchen GERD (gastroesophageal reflux disease)   . History of double vision    Ophthalmologist, Dr. Herbert Deaner, is further evaluating this with MRI  orbits and limited brain (myesthenia gravis testing neg 07/2010)  . History of home oxygen therapy    uses 2 liters at hs  . Hypertension    EKG 03/2010 normal  . Hypothyroidism    Hashimoto's, dx'd 1989  . Hypoxemia    O2 86% RA with exertion 01/10/09, 2 liters oxygen with exertion and sleep  . Lesion of right native kidney 2017   found on CT 02/2015.  F/u MRI 03/31/15 showed it to be smaller and less likely of any significance but repeat CT renal protocol in 27mo was recommended by radiology.  This was done 10/26/15 and showed no interval change in the lesion, but b/c the lesion enhances with contrast, radiol suggested urol consultation (b/c papillary renal cell ca could not be excluded).  Saw urol 03/06/16--stable on u/s 09/2016  . Osteoarthritis, hip, bilateral    End stage;  R THA  . Postmenopausal atrophic vaginitis    improved with estrace 2 X/week.  . Pulmonary nodule    CT chest 12/10, June 2011.  No nodule on CT 06/2010.  Marland Kitchen Recurrent Clostridium difficile diarrhea    Episode 09/19/2016: resolved with prolonged course of flagyl.  11/2016 recurrence treated with 10d of Dificid (Fidaxomicin) by GI (Dr. Earlean Shawl).  . Recurrent UTI    likely related to pt's atrophic vaginitis.  Urol started vaginal estrace 03/2016.  Marland Kitchen Shingles 02/2014   R abd/flank  . 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 Upper airway cough syndrome     Patient  Active Problem List   Diagnosis Date Noted  . Postherpetic neuralgia 03/07/2015  . Constipation 02/28/2015  . Renal mass 02/28/2015  . Acute pyelonephritis 02/20/2015  . Oral thrush 02/08/2015  . Chronic hypoxemic respiratory failure ([place]) 02/02/2015  . SOB (shortness of breath)   . Acute respiratory failure ([place]) 01/24/2015  . Essential hypertension 01/24/2015  . UTI (urinary tract infection) 01/09/2015  . Pain and swelling of right lower leg 01/09/2015  . H/O total hip arthroplasty 11/01/2014  . Spinal stenosis, lumbar region, with neurogenic claudication 01/04/2014  . Osteoarthritis, hip, bilateral 07/27/2013  . Sciatica associated with disorder of lumbosacral spine 04/19/2013  . Atrophic vaginitis 01/12/2012  . Periodic limb movement disorder 11/24/2011  . Hypoxia 05/22/2011  . Depression with anxiety 04/15/2011  . Chronic venous insufficiency 07/29/2010  . Hypothyroidism due to Hashimoto's thyroiditis 01/07/2010  . SPINAL STENOSIS 01/07/2010  . COPD with chronic bronchitis ([place]) 08/09/2007    Past Surgical History:  Procedure Laterality Date  . ABDOMINAL HYSTERECTOMY  1978   no BSO per pt--nonmalignant reasons  . APPENDECTOMY    . BREAST CYST EXCISION     Last screening mammogram 10/2010 was normal.  . CHOLECYSTECTOMY  2001  . COLONOSCOPY  04/2005; 12/06/2015   2007 NORMAL.  2017 adenomatous polyp x 1.  Mod sigmoid diverticulosis (Dr. Earlean Shawl).    Marland Kitchen HIP CLOSED REDUCTION Right 12/25/2015   Procedure: CLOSED REDUCTION HIP;  Surgeon: Nicholes Stairs, MD;  Location: [facility];  Service: Orthopedics;  Laterality: Right;  . TONSILLECTOMY    . TOTAL HIP ARTHROPLASTY Right 11/01/2014   Procedure: RIGHT TOTAL HIP ARTHROPLASTY;  Surgeon: Latanya Maudlin, MD;  Location: [facility];  Service: Orthopedics;  Laterality: Right;  . TRANSTHORACIC ECHOCARDIOGRAM  08/2014   EF 60-65%, grade I diast dysfxn  . TUBAL LIGATION  1974     OB History   None      Home Medications    Prior to  Admission medications   Medication Sig Start Date End Date Taking? Authorizing Provider  acetaminophen (TYLENOL) 500 MG tablet Take 500 mg by mouth every 6 (six) hours as needed for mild pain, moderate pain, fever or headache.    [provider]  ADVAIR DISKUS 250-50 MCG/DOSE AEPB INHALE 1 PUFF INTO THE LUNGS TWICE DAILY 01/28/17   Chesley Mires, MD  albuterol (PROVENTIL HFA;VENTOLIN HFA) 108 (90 Base) MCG/ACT inhaler INHALE 2 PUFFS BY MOUTH EVERY 4 HOURS AS NEEDED 05/08/17   McGowen, Adrian Blackwater, MD  albuterol (PROVENTIL) (2.5 MG/3ML) 0.083% nebulizer solution Take 3 mLs (2.5 mg total) by nebulization every 6 (six) hours as needed for wheezing or shortness of breath. 04/01/16 04/01/17  McGowen, Adrian Blackwater, MD  amLODipine (NORVASC) 5 MG tablet Take 1 tablet (5 mg total) by mouth daily. 08/15/16   McGowen,  Adrian Blackwater, MD  aspirin EC 81 MG tablet Take 81 mg by mouth daily.    [provider]  azelastine (ASTELIN) 0.1 % nasal spray Place 2 sprays into both nostrils 2 (two) times daily. Use in each nostril as directed 03/11/17   McGowen, Adrian Blackwater, MD  benzonatate (TESSALON) 200 MG capsule Take 1 capsule (200 mg total) by mouth 2 (two) times daily as needed for cough. 05/06/17   McGowen, Adrian Blackwater, MD  cetirizine (ZYRTEC) 10 MG tablet Take 1 tablet (10 mg total) by mouth daily. Patient taking differently: Take 10-20 mg by mouth daily as needed for allergies.  10/11/14   McGowen, Adrian Blackwater, MD  DALIRESP 500 MCG TABS tablet TAKE 1 TABLET BY MOUTH EVERY DAY 04/28/17   Chesley Mires, MD  diazepam (VALIUM) 5 MG tablet TAKE 1 TABLET BY MOUTH EVERY MORNING AND 2 TABLETS EVERY NIGHT AT BEDTIME 12/29/16   McGowen, Adrian Blackwater, MD  doxycycline (VIBRAMYCIN) 100 MG capsule Take 1 capsule (100 mg total) by mouth 2 (two) times daily. 05/11/17   Katrin Grabel, Barbette Hair, MD  estradiol (ESTRACE) 0.1 MG/GM vaginal cream APPLY 1/2 INCH RIBBON VAGINALLY QHS FOR 14 DAYS THEN TWICE WEEKLY 01/08/17   [provider]  fluticasone  (FLONASE) 50 MCG/ACT nasal spray Place 1 spray into both nostrils daily. 01/21/17   Chesley Mires, MD  hyoscyamine (LEVSIN SL) 0.125 MG SL tablet 1-2 tabs po q6h prn lower abd pain and urgent loose stools 09/17/16   McGowen, Adrian Blackwater, MD  Lactobacillus (PROBIOTIC ACIDOPHILUS) CAPS Take 2 capsules by mouth daily.    [provider]  Lifitegrast Shirley Friar) 5 % SOLN Place 1 drop into both eyes 2 (two) times daily.    [provider]  predniSONE (DELTASONE) 20 MG tablet 2 tabs po qd x 5d 05/06/17   McGowen, Adrian Blackwater, MD  Probiotic Product (PROBIOTIC DAILY PO) Take 2 capsules by mouth daily.     [provider]  SPIRIVA HANDIHALER 18 MCG inhalation capsule INHALE 1 CAPSULE VIA HANDIHALER EVERY DAY AT THE SAME TIME 04/09/17   Chesley Mires, MD  SYNTHROID 88 MCG tablet TAKE 1 TABLET BY MOUTH DAILY 03/26/17   McGowen, Adrian Blackwater, MD  Tetrahydrozoline HCl (EYE DROPS OP) Apply to eye. SootheXP    [provider]  triamcinolone cream (KENALOG) 0.1 % Apply 1 application topically 2 (two) times daily. 08/08/16   Kuneff, Renee A, DO  venlafaxine (EFFEXOR) 50 MG tablet TAKE 1 TABLET(50 MG) BY MOUTH TWICE DAILY 09/17/16   McGowen, Adrian Blackwater, MD    Family History Family History  Problem Relation Age of Onset  . Goiter Mother   . Psoriasis Father        od liver  . Cirrhosis Father   . Diabetes Daughter        Type 2  . Cancer Paternal Aunt        stomach?  . Stroke Maternal Grandfather   . Stroke Paternal Grandmother   . Hypertension Paternal Grandmother   . Hernia Paternal Grandmother   . Cancer Paternal Grandfather        lung    Social History Social History   Tobacco Use  . Smoking status: Former Smoker    Packs/day: 2.00    Years: 35.00    Pack years: 70.00    Types: Cigarettes    Last attempt to quit: 02/03/1990    Years since quitting: 27.2  . Smokeless tobacco: Never Used  Substance Use Topics  .  Alcohol use: Yes    Comment: Rarely  . Drug use: No      Allergies   Losartan; Penicillins; Cefixime [kdc:cefixime]; Indocin [indomethacin]; Sulfa antibiotics; Ace inhibitors; and Statins   Review of Systems Review of Systems  Constitutional: Negative for fever.  HENT: Positive for congestion and voice change. Negative for sore throat and trouble swallowing.   Respiratory: Positive for cough, shortness of breath and wheezing.   Cardiovascular: Negative for chest pain.  Gastrointestinal: Negative for abdominal pain, nausea and vomiting.  Genitourinary: Negative for dysuria.  All other systems reviewed and are negative.    Physical Exam Updated Vital Signs BP 136/72   Pulse 81   Temp 98.5 F (36.9 C) (Oral)   Resp (!) 27   Ht 5' (1.524 m)   Wt 63.5 kg (140 lb)   SpO2 95%   BMI 27.34 kg/m   Physical Exam  Constitutional: She is oriented to person, place, and time. She appears well-developed and well-nourished. No distress.  HENT:  Head: Normocephalic and atraumatic.  Mouth/Throat: No oropharyngeal exudate.  Postnasal drip noted, swollen nasal turbinates, scratchy voice, no hoarseness noted  Eyes: Pupils are equal, round, and reactive to light.  Neck: Normal range of motion. Neck supple.  Cardiovascular: Normal rate, regular rhythm and normal heart sounds.  Pulmonary/Chest: Effort normal. No respiratory distress. She has wheezes in the right upper field and the left upper field. She has no rales.  Abdominal: Soft. Bowel sounds are normal.  Neurological: She is alert and oriented to person, place, and time.  Skin: Skin is warm and dry.  Psychiatric: She has a normal mood and affect.  Nursing note and vitals reviewed.    ED Treatments / Results  Labs (all labs ordered are listed, but only abnormal results are displayed) Labs Reviewed - No data to display  EKG EKG Interpretation  Date/Time:  Sunday May 10 2017 23:26:27 EDT Ventricular Rate:  78 PR Interval:    QRS Duration: 87 QT Interval:  371 QTC  Calculation: 423 R Axis:   23 Text Interpretation:  Sinus rhythm Confirmed by [facility] [phone]) on 05/11/2017 1:08:04 AM   Radiology Dg Chest 2 View  Result Date: 05/11/2017 CLINICAL DATA:  82 y/o F; 2 weeks of cold symptoms with fever today. EXAM: CHEST - 2 VIEW COMPARISON:  04/06/2017 chest radiograph FINDINGS: Stable cardiac silhouette within normal limits given projection and technique. Aortic atherosclerosis with calcification. Stable chronic lung changes. No focal consolidation. No pleural effusion or pneumothorax. No acute osseous abnormality is evident. IMPRESSION: Stable chronic lung changes. No focal consolidation. Aortic atherosclerosis Electronically Signed   By: Kristine Garbe M.D.   On: 05/11/2017 01:00    Procedures Procedures (including critical care time)  Medications Ordered in ED Medications  ipratropium-albuterol (DUONEB) 0.5-2.5 (3) MG/3ML nebulizer solution 3 mL (3 mLs Nebulization Given 05/11/17 0042)  doxycycline (VIBRA-TABS) tablet 100 mg (100 mg Oral Given 05/11/17 0122)     Initial Impression / Assessment and Plan / ED Course  I have reviewed the triage vital signs and the nursing notes.  Pertinent labs & imaging results that were available during my care of the patient were reviewed by me and considered in my medical decision making (see chart for details).     Patient presents with persistent shortness of breath, cough, upper respiratory congestion.  She is overall nontoxic appearing.  She is afebrile.  Vital signs are otherwise reassuring.  She is on her home O2 and in no respiratory distress.  She is wheezing on exam and does have persistent coughing.  She has evidence of postnasal drip and swollen nasal turbinates.  EKG shows no evidence of ischemia.  Suspect upper respiratory etiology.  Patient is reporting that her cough seems to be more "in her throat."  Given postnasal drip, suspect this is triggering her cough.  Patient was given a DuoNeb  with much improvement of her breath sounds.  She does have a persistent cough.  She is currently taking benzonatate.  I hesitate to give her anything with codeine or Phenergan given her age.  Recommend continued supportive measures.  Chest x-ray shows no evidence of pneumonia; however, given history of COPD and persistent symptoms, will add doxycycline.  On recheck, patient continues to cough but her breath sounds are clear and she is in no respiratory distress.  Continued management at home recommended.  Follow-up with the pulmonologist recommended.  After history, exam, and medical workup I feel the patient has been appropriately medically screened and is safe for discharge home. Pertinent diagnoses were discussed with the patient. Patient was given return precautions.     Final Clinical Impressions(s) / ED Diagnoses   Final diagnoses:  COPD exacerbation ([place])  URI with cough and congestion    ED Discharge Orders        Ordered    doxycycline (VIBRAMYCIN) 100 MG capsule  2 times daily     05/11/17 0139       Merryl Hacker, MD 05/11/17 [phone]

## 2017-05-11 NOTE — Discharge Instructions (Addendum)
You were seen today for persistent cough and wheezing.  Continue your steroids at home.  Continue nasal saline and medications per your primary physician.  You may use your nebulizer every 4 hours.  Follow-up with your primary physician if not improving in 1-2 days.

## 2017-05-11 NOTE — ED Notes (Signed)
Pt. returned from XR. 

## 2017-05-12 ENCOUNTER — Telehealth: Payer: Self-pay | Admitting: Pulmonary Disease

## 2017-05-12 NOTE — Telephone Encounter (Signed)
PA request received for Daliresp 589mcg from [email or web address]  Key: [id number] PA has been initiated and sent to plan, is pending review.  A determination is expected within 72 hours.  Sending to [facility] for follow-up.

## 2017-05-12 NOTE — Telephone Encounter (Signed)
PA for [name] has been approved through 02/02/2018.  PA# [id number].  [place] to make aware- copay is still high with approval.  Per previous phone note, pt is applying through financial assistance for this medication and will bring these forms to her office visit on 4/16.  Nothing further needed at this time.

## 2017-05-14 ENCOUNTER — Telehealth: Payer: Self-pay | Admitting: Pulmonary Disease

## 2017-05-14 ENCOUNTER — Encounter: Payer: Self-pay | Admitting: Adult Health

## 2017-05-14 ENCOUNTER — Ambulatory Visit (INDEPENDENT_AMBULATORY_CARE_PROVIDER_SITE_OTHER): Payer: Medicare Other | Admitting: Adult Health

## 2017-05-14 ENCOUNTER — Other Ambulatory Visit (INDEPENDENT_AMBULATORY_CARE_PROVIDER_SITE_OTHER): Payer: Medicare Other

## 2017-05-14 VITALS — BP 116/68 | HR 76 | Temp 97.4°F | Ht 61.0 in | Wt 143.0 lb

## 2017-05-14 DIAGNOSIS — J301 Allergic rhinitis due to pollen: Secondary | ICD-10-CM | POA: Diagnosis not present

## 2017-05-14 DIAGNOSIS — R05 Cough: Secondary | ICD-10-CM

## 2017-05-14 DIAGNOSIS — J449 Chronic obstructive pulmonary disease, unspecified: Secondary | ICD-10-CM

## 2017-05-14 DIAGNOSIS — R058 Other specified cough: Secondary | ICD-10-CM

## 2017-05-14 DIAGNOSIS — J9611 Chronic respiratory failure with hypoxia: Secondary | ICD-10-CM

## 2017-05-14 LAB — CBC WITH DIFFERENTIAL/PLATELET
Basophils Absolute: 0.1 10*3/uL (ref 0.0–0.1)
Basophils Relative: 0.7 % (ref 0.0–3.0)
Eosinophils Absolute: 0.2 10*3/uL (ref 0.0–0.7)
Eosinophils Relative: 1.8 % (ref 0.0–5.0)
HCT: 40.4 % (ref 36.0–46.0)
Hemoglobin: 13.4 g/dL (ref 12.0–15.0)
Lymphocytes Relative: 16.8 % (ref 12.0–46.0)
Lymphs Abs: 1.7 10*3/uL (ref 0.7–4.0)
MCHC: 33 g/dL (ref 30.0–36.0)
MCV: 86 fl (ref 78.0–100.0)
Monocytes Absolute: 0.9 10*3/uL (ref 0.1–1.0)
Monocytes Relative: 8.7 % (ref 3.0–12.0)
Neutro Abs: 7.4 10*3/uL (ref 1.4–7.7)
Neutrophils Relative %: 72 % (ref 43.0–77.0)
Platelets: 370 10*3/uL (ref 150.0–400.0)
RBC: 4.7 Mil/uL (ref 3.87–5.11)
RDW: 14.7 % (ref 11.5–15.5)
WBC: 10.3 10*3/uL (ref 4.0–10.5)

## 2017-05-14 MED ORDER — ROFLUMILAST 500 MCG PO TABS: 500.0000 ug | ORAL_TABLET | Freq: Every day | ORAL | 1 refills | Status: DC

## 2017-05-14 NOTE — Telephone Encounter (Signed)
Spoke with pt and advised rx sent to pharmacy. Nothing further is needed.   

## 2017-05-14 NOTE — Progress Notes (Signed)
@Patient  ID: Priscilla Cantrell, female    DOB: 1935-02-24, 82 y.o.   MRN: [id number]  Chief Complaint  Patient presents with  . Follow-up    COPD     Referring provider: [name], MD  HPI: 82 year old female former smoker followed for gold for COPD, ACE related cough and nocturnal hypoxemia  Pulmonary tests Spirometry 01/10/09>>FEV1 0.97(52%), FEV1% 47 March 2012>>Daliresp started  Sleep tests PSG 11/14/11 >> AHI 0.3 ONO with RA 07/31/16 >>test time 6 hrs 41 min. Average SpO2 87%, low SpO2 74%. Spent 4 hrs 13 min with SpO2 <88%.  Cardiac tests Echo 08/30/14 >> EF 60 to 69%, grade 1 diastolic dysfx, PAS 24 mmHG  05/14/2017 Follow up : COPD  , O2 RF  Patient returns for a follow-up for COPD and ER follow-up.  Patient was seen in the emergency room 3 days ago for COPD exacerbation given doxycycline.  CXR showed COPD changes. She is feeling some better . Feels she has an allergy that could be causing her attacks.  Has post nasal drainage that is thick at times. Taking zyrtec and astelin.   She is on Advair and Spiriva.  She also takes Daliresp., feels it really helps .  Daliresp is going to cost her $450 /month . We discussed options to help with cost.   She remains on oxygen at bedtime feels it helps.   Allergies  Allergen Reactions  . Losartan Other (See Comments)    hyperkalemia  . Penicillins Hives, Swelling and Rash    Has patient had a PCN reaction causing immediate rash, facial/tongue/throat swelling, SOB or lightheadedness with hypotension: YES Has patient had a PCN reaction causing severe rash involving mucus membranes or skin necrosis: NO Has patient had a PCN reaction that required hospitalization NO Has patient had a PCN reaction occurring within the last 10 years: NO If all of the above answers are "NO", then may proceed with Cephalosporin use.   . Cefixime [Kdc:Cefixime] Other (See Comments)    unspecified  . Indocin [Indomethacin] Other (See  Comments)    Painful tongue and throat  . Sulfa Antibiotics Other (See Comments)    unspecified  . Ace Inhibitors Other (See Comments)    cough  . Statins Other (See Comments)    Myalgias     Immunization History  Administered Date(s) Administered  . Influenza Split 11/04/2010, 10/23/2011  . Influenza Whole 10/04/2009  . Influenza, High Dose Seasonal PF 11/07/2016  . Influenza,inj,Quad PF,6+ Mos 10/07/2012, 10/11/2014  . Influenza-Unspecified 11/07/2013, 11/01/2015  . Pneumococcal Conjugate-13 04/29/2013  . Pneumococcal Polysaccharide-23 02/03/2005, 11/28/2010  . Td 02/04/2008, 03/07/2010  . Zoster 02/10/2013  . Zoster Recombinat (Shingrix) 10/30/2016, 12/29/2016    Past Medical History:  Diagnosis Date  . Asthma   . Chest pain, non-cardiac    Cardiac CT showed no signif obstructive dz, mildly elevated calcium level (Dr. [name]).  . Chronic cystitis with hematuria    [name]  . Chronic hypoxemic respiratory failure ([place]) 02/02/2015   2L oxygen 24/7  . COPD (chronic obstructive pulmonary disease) ([place])    spirometry 01/10/09 FEV 0.97(52%), FEV1% 47  . DDD (degenerative disc disease), cervical    1998 MRI C5-6 impingemt (left)  . DDD (degenerative disc disease), lumbar   . Depression with anxiety 04/15/2011  . Diarrheal disease Summer 2017   ? 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).  As of 10/05/15 pt reports  GI dx'd her with C diff and rx'd more flagyl and she is also on cholestyramine.  As of 02/2016, pt's sx's resolved completely.  . Diverticulosis of colon    Procto '97 and colonoscopy 2002  . Dysplastic nevus of upper extremity 04/2014   R tricep ([name])  . Family history of adverse reaction to anesthesia    mother died when patient age 73 receiving anesthesia for thyroid surgery  . Fibrocystic breast disease    w/fibroadenoma.  Bx's showed NO  atypia (Dr. Margot Chimes).  Mammo neg 2008.  Marland Kitchen GERD (gastroesophageal reflux disease)   . History of double vision    Ophthalmologist, Dr. Herbert Deaner, is further evaluating this with MRI  orbits and limited brain (myesthenia gravis testing neg 07/2010)  . History of home oxygen therapy    uses 2 liters at hs  . Hypertension    EKG 03/2010 normal  . Hypothyroidism    Hashimoto's, dx'd 1989  . Hypoxemia    O2 86% RA with exertion 01/10/09, 2 liters oxygen with exertion and sleep  . Lesion of right native kidney 2017   found on CT 02/2015.  F/u MRI 03/31/15 showed it to be smaller and less likely of any significance but repeat CT renal protocol in 46mo was recommended by radiology.  This was done 10/26/15 and showed no interval change in the lesion, but b/c the lesion enhances with contrast, radiol suggested urol consultation (b/c papillary renal cell ca could not be excluded).  Saw urol 03/06/16--stable on u/s 09/2016  . Osteoarthritis, hip, bilateral    End stage; R THA  . Postmenopausal atrophic vaginitis    improved with estrace 2 X/week.  . Pulmonary nodule    CT chest 12/10, June 2011.  No nodule on CT 06/2010.  Marland Kitchen Recurrent Clostridium difficile diarrhea    Episode 09/19/2016: resolved with prolonged course of flagyl.  11/2016 recurrence treated with 10d of Dificid (Fidaxomicin) by GI (Dr. Earlean Shawl).  . Recurrent UTI    likely related to pt's atrophic vaginitis.  Urol started vaginal estrace 03/2016.  Marland Kitchen Shingles 02/2014   R abd/flank  . 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 Upper airway cough syndrome     Tobacco History: Social History   Tobacco Use  Smoking Status Former Smoker  . Packs/day: 2.00  . Years: 35.00  . Pack years: 70.00  . Types: Cigarettes  . Last attempt to quit: 02/03/1990  . Years since quitting: 27.2  Smokeless Tobacco Never Used   Counseling given: Not Answered   Outpatient Encounter Medications as of 05/14/2017    Medication Sig  . acetaminophen (TYLENOL) 500 MG tablet Take 500 mg by mouth every 6 (six) hours as needed for mild pain, moderate pain, fever or headache.  . ADVAIR DISKUS 250-50 MCG/DOSE AEPB INHALE 1 PUFF INTO THE LUNGS TWICE DAILY  . albuterol (PROVENTIL HFA;VENTOLIN HFA) 108 (90 Base) MCG/ACT inhaler INHALE 2 PUFFS BY MOUTH EVERY 4 HOURS AS NEEDED  . amLODipine (NORVASC) 5 MG tablet Take 1 tablet (5 mg total) by mouth daily.  Marland Kitchen aspirin EC 81 MG tablet Take 81 mg by mouth daily.  Marland Kitchen azelastine (ASTELIN) 0.1 % nasal spray Place 2 sprays into both nostrils 2 (two) times daily. Use in each nostril as directed  . benzonatate (TESSALON) 200 MG capsule Take 1 capsule (200 mg total) by mouth 2 (two) times daily as needed for cough.  . cetirizine (ZYRTEC) 10  MG tablet Take 1 tablet (10 mg total) by mouth daily. (Patient taking differently: Take 10-20 mg by mouth daily as needed for allergies. )  . DALIRESP 500 MCG TABS tablet TAKE 1 TABLET BY MOUTH EVERY DAY  . diazepam (VALIUM) 5 MG tablet TAKE 1 TABLET BY MOUTH EVERY MORNING AND 2 TABLETS EVERY NIGHT AT BEDTIME  . doxycycline (VIBRAMYCIN) 100 MG capsule Take 1 capsule (100 mg total) by mouth 2 (two) times daily.  Marland Kitchen estradiol (ESTRACE) 0.1 MG/GM vaginal cream APPLY 1/2 INCH RIBBON VAGINALLY QHS FOR 14 DAYS THEN TWICE WEEKLY  . fluticasone (FLONASE) 50 MCG/ACT nasal spray Place 1 spray into both nostrils daily.  . hyoscyamine (LEVSIN SL) 0.125 MG SL tablet 1-2 tabs po q6h prn lower abd pain and urgent loose stools  . Lactobacillus (PROBIOTIC ACIDOPHILUS) CAPS Take 2 capsules by mouth daily.  Marland Kitchen Lifitegrast (XIIDRA) 5 % SOLN Place 1 drop into both eyes 2 (two) times daily.  . predniSONE (DELTASONE) 20 MG tablet 2 tabs po qd x 5d  . Probiotic Product (PROBIOTIC DAILY PO) Take 2 capsules by mouth daily.   Marland Kitchen SPIRIVA HANDIHALER 18 MCG inhalation capsule INHALE 1 CAPSULE VIA HANDIHALER EVERY DAY AT THE SAME TIME  . SYNTHROID 88 MCG tablet TAKE 1 TABLET BY  MOUTH DAILY  . Tetrahydrozoline HCl (EYE DROPS OP) Apply to eye. SootheXP  . triamcinolone cream (KENALOG) 0.1 % Apply 1 application topically 2 (two) times daily.  Marland Kitchen venlafaxine (EFFEXOR) 50 MG tablet TAKE 1 TABLET(50 MG) BY MOUTH TWICE DAILY  . albuterol (PROVENTIL) (2.5 MG/3ML) 0.083% nebulizer solution Take 3 mLs (2.5 mg total) by nebulization every 6 (six) hours as needed for wheezing or shortness of breath.   No facility-administered encounter medications on file as of 05/14/2017.      Review of Systems  Constitutional:   No  weight loss, night sweats,  Fevers, chills, fatigue, or  lassitude.  HEENT:   No headaches,  Difficulty swallowing,  Tooth/dental problems, or  Sore throat,                No sneezing, itching, ear ache, + nasal congestion, post nasal drip,   CV:  No chest pain,  Orthopnea, PND, swelling in lower extremities, anasarca, dizziness, palpitations, syncope.   GI  No heartburn, indigestion, abdominal pain, nausea, vomiting, diarrhea, change in bowel habits, loss of appetite, bloody stools.   Resp:    No chest wall deformity  Skin: no rash or lesions.  GU: no dysuria, change in color of urine, no urgency or frequency.  No flank pain, no hematuria   MS:  No joint pain or swelling.  No decreased range of motion.  No back pain.    Physical Exam  BP 116/68 (BP Location: Left Arm, Patient Position: Sitting, Cuff Size: Normal)   Pulse 76   Temp (!) 97.4 F (36.3 C) (Oral)   Ht 5\' 1"  (1.549 m)   Wt 143 lb (64.9 kg)   SpO2 94%   BMI 27.02 kg/m   GEN: A/Ox3; pleasant , NAD, elderly    HEENT:  [place]/AT,  EACs-clear, TMs-wnl, NOSE-clear drainage  THROAT-clear, no lesions, no postnasal drip or exudate noted.   NECK:  Supple w/ fair ROM; no JVD; normal carotid impulses w/o bruits; no thyromegaly or nodules palpated; no lymphadenopathy.    RESP  Clear  P & A; w/o, wheezes/ rales/ or rhonchi. no accessory muscle use, no dullness to percussion  CARD:  RRR, no  m/r/g, no  peripheral edema, pulses intact, no cyanosis or clubbing.  GI:   Soft & nt; nml bowel sounds; no organomegaly or masses detected.   Musco: Warm bil, no deformities or joint swelling noted.   Neuro: alert, no focal deficits noted.    Skin: Warm, no lesions or rashes    Lab Results:   BMET   ProBNP No results found for: PROBNP  Imaging: Dg Chest 2 View  Result Date: 05/11/2017 CLINICAL DATA:  82 y/o F; 2 weeks of cold symptoms with fever today. EXAM: CHEST - 2 VIEW COMPARISON:  04/06/2017 chest radiograph FINDINGS: Stable cardiac silhouette within normal limits given projection and technique. Aortic atherosclerosis with calcification. Stable chronic lung changes. No focal consolidation. No pleural effusion or pneumothorax. No acute osseous abnormality is evident. IMPRESSION: Stable chronic lung changes. No focal consolidation. Aortic atherosclerosis Electronically Signed   By: Kristine Garbe M.D.   On: 05/11/2017 01:00     Assessment & Plan:   No problem-specific Assessment & Plan notes found for this encounter.     Rexene Edison, NP 05/14/2017

## 2017-05-14 NOTE — Patient Instructions (Addendum)
Mucinex DM Twice daily  As needed  Cough/congestion.  Continue on Advair Spiriva and Daliresp May use albuterol nebulizer as needed for wheezing and congestion. Saline nasal spray as needed. Continue on Zyrtec daily Continue on Astelin nasal spray Labs today .  Continue on oxygen 2l/m At bedtime  .  Follow-up with [name]  in 3 months and As needed   Please contact office for sooner follow up if symptoms do not improve or worsen or seek emergency care

## 2017-05-15 LAB — RESPIRATORY ALLERGY PROFILE REGION II ~~LOC~~

## 2017-05-15 LAB — INTERPRETATION:

## 2017-05-15 NOTE — Assessment & Plan Note (Signed)
Flare  Check RAST/IgE , CBC w/ diff

## 2017-05-15 NOTE — Assessment & Plan Note (Signed)
Recent flare, improving on abx  CXR w/ no acute process  Trigger control   Plan  Patient Instructions  Mucinex DM Twice daily  As needed  Cough/congestion.  Continue on Advair Spiriva and Daliresp May use albuterol nebulizer as needed for wheezing and congestion. Saline nasal spray as needed. Continue on Zyrtec daily Continue on Astelin nasal spray Labs today .  Continue on oxygen 2l/m At bedtime  .  Follow-up with [name]  in 3 months and As needed   Please contact office for sooner follow up if symptoms do not improve or worsen or seek emergency care

## 2017-05-15 NOTE — Assessment & Plan Note (Signed)
Cont on Oxygen 2l/m At bedtime

## 2017-05-15 NOTE — Progress Notes (Signed)
Reviewed and agree with assessment/plan.   Syriah Delisi, MD [facility] 01/30/2016, 12:24 PM Pager:  [phone]  

## 2017-05-18 DIAGNOSIS — Z780 Asymptomatic menopausal state: Secondary | ICD-10-CM | POA: Diagnosis not present

## 2017-05-18 DIAGNOSIS — Z1231 Encounter for screening mammogram for malignant neoplasm of breast: Secondary | ICD-10-CM | POA: Diagnosis not present

## 2017-05-18 HISTORY — PX: OTHER SURGICAL HISTORY: SHX169

## 2017-05-18 LAB — HM DEXA SCAN

## 2017-05-18 LAB — HM MAMMOGRAPHY: HM Mammogram: NORMAL (ref 0–4)

## 2017-05-19 ENCOUNTER — Telehealth: Payer: Self-pay | Admitting: Pulmonary Disease

## 2017-05-19 ENCOUNTER — Ambulatory Visit: Payer: Medicare Other | Admitting: Pulmonary Disease

## 2017-05-19 NOTE — Telephone Encounter (Signed)
Left VM for pt regarding Deliresp 264mcg samples up front in the first cabinet in brown bag. X1 Pt RX for Daliresp is 576mcg, pt will need to take two tabs of the samples as they are 251mcg to equal her dose.

## 2017-05-20 ENCOUNTER — Encounter: Payer: Self-pay | Admitting: Family Medicine

## 2017-05-20 ENCOUNTER — Ambulatory Visit (INDEPENDENT_AMBULATORY_CARE_PROVIDER_SITE_OTHER): Payer: Medicare Other | Admitting: Family Medicine

## 2017-05-20 VITALS — BP 128/75 | HR 75 | Temp 97.3°F | Resp 16 | Ht 61.0 in | Wt 141.0 lb

## 2017-05-20 DIAGNOSIS — I10 Essential (primary) hypertension: Secondary | ICD-10-CM

## 2017-05-20 DIAGNOSIS — F3340 Major depressive disorder, recurrent, in remission, unspecified: Secondary | ICD-10-CM

## 2017-05-20 DIAGNOSIS — J441 Chronic obstructive pulmonary disease with (acute) exacerbation: Secondary | ICD-10-CM

## 2017-05-20 DIAGNOSIS — F419 Anxiety disorder, unspecified: Secondary | ICD-10-CM | POA: Diagnosis not present

## 2017-05-20 DIAGNOSIS — E039 Hypothyroidism, unspecified: Secondary | ICD-10-CM

## 2017-05-20 MED ORDER — PREDNISONE 10 MG PO TABS: ORAL_TABLET | ORAL | 0 refills | Status: DC

## 2017-05-20 NOTE — Progress Notes (Signed)
OFFICE VISIT  05/20/2017   CC:  Chief Complaint  Patient presents with  . Follow-up    RCI, (cough)   HPI:    Patient is a 82 y.o. Caucasian female who presents for f/u HTN, hypothyroidism, depression/anxiety. She has COPD, gets periodic f/u with pulmonology.  Three weeks or so of generalized malaise, lots more coughing, feels PND sensation and sense of increased SOB with walking INTERMITTENTLY. Feeling of nasal congestion and sinus/head fullness with this.  No ST.   Has increased need for albuterol during this time--feels relief from this. Taking mucinex DM and zyrtec.  Taking astelin. No face pain, no HA.  She is compliant with all COPD meds. She was seen in the ED 05/12/17, dx'd with COPD exac and rx'd doxycycline x 10d and no systemic steroids.  HTN: regularly monitoring at home and consistently <130/80.  Dep/anx: stable.  Compliant with med.     Past Medical History:  Diagnosis Date  . Asthma   . Chest pain, non-cardiac    Cardiac CT showed no signif obstructive dz, mildly elevated calcium level (Dr. [name]).  . Chronic cystitis with hematuria    [name]  . Chronic hypoxemic respiratory failure ([place]) 02/02/2015   2L oxygen 24/7  . COPD (chronic obstructive pulmonary disease) ([place])    spirometry 01/10/09 FEV 0.97(52%), FEV1% 47  . DDD (degenerative disc disease), cervical    1998 MRI C5-6 impingemt (left)  . DDD (degenerative disc disease), lumbar   . Depression with anxiety 04/15/2011  . Diarrheal disease Summer 2017   ? 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).  As of 10/05/15 pt reports GI dx'd her with C diff and rx'd more flagyl and she is also on cholestyramine.  As of 02/2016, pt's sx's resolved completely.  . Diverticulosis of colon    Procto '97 and colonoscopy 2002  . Dysplastic nevus of upper extremity 04/2014   R tricep ([name])  .  Family history of adverse reaction to anesthesia    mother died when patient age 8 receiving anesthesia for thyroid surgery  . Fibrocystic breast disease    w/fibroadenoma.  Bx's showed NO atypia (Dr. Margot Chimes).  Mammo neg 2008.  Marland Kitchen GERD (gastroesophageal reflux disease)   . History of double vision    Ophthalmologist, Dr. Herbert Deaner, is further evaluating this with MRI  orbits and limited brain (myesthenia gravis testing neg 07/2010)  . History of home oxygen therapy    uses 2 liters at hs  . Hypertension    EKG 03/2010 normal  . Hypothyroidism    Hashimoto's, dx'd 1989  . Hypoxemia    O2 86% RA with exertion 01/10/09, 2 liters oxygen with exertion and sleep  . Lesion of right native kidney 2017   found on CT 02/2015.  F/u MRI 03/31/15 showed it to be smaller and less likely of any significance but repeat CT renal protocol in 71mo was recommended by radiology.  This was done 10/26/15 and showed no interval change in the lesion, but b/c the lesion enhances with contrast, radiol suggested urol consultation (b/c papillary renal cell ca could not be excluded).  Saw urol 03/06/16--stable on u/s 09/2016  . Osteoarthritis, hip, bilateral    End stage; R THA  . Postmenopausal atrophic vaginitis    improved with estrace 2 X/week.  . Pulmonary nodule    CT chest 12/10, June 2011.  No nodule on CT 06/2010.  Marland Kitchen  Recurrent Clostridium difficile diarrhea    Episode 09/19/2016: resolved with prolonged course of flagyl.  11/2016 recurrence treated with 10d of Dificid (Fidaxomicin) by GI (Dr. Earlean Shawl).  . Recurrent UTI    likely related to pt's atrophic vaginitis.  Urol started vaginal estrace 03/2016.  Marland Kitchen Shingles 02/2014   R abd/flank  . 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 Upper airway cough syndrome     Past Surgical History:  Procedure Laterality Date  . ABDOMINAL HYSTERECTOMY  1978   no BSO per pt--nonmalignant reasons  . APPENDECTOMY    . BREAST CYST  EXCISION     Last screening mammogram 10/2010 was normal.  . CHOLECYSTECTOMY  2001  . COLONOSCOPY  04/2005; 12/06/2015   2007 NORMAL.  2017 adenomatous polyp x 1.  Mod sigmoid diverticulosis (Dr. Earlean Shawl).    Marland Kitchen HIP CLOSED REDUCTION Right 12/25/2015   Procedure: CLOSED REDUCTION HIP;  Surgeon: Nicholes Stairs, MD;  Location: [facility];  Service: Orthopedics;  Laterality: Right;  . TONSILLECTOMY    . TOTAL HIP ARTHROPLASTY Right 11/01/2014   Procedure: RIGHT TOTAL HIP ARTHROPLASTY;  Surgeon: Latanya Maudlin, MD;  Location: [facility];  Service: Orthopedics;  Laterality: Right;  . TRANSTHORACIC ECHOCARDIOGRAM  08/2014   EF 60-65%, grade I diast dysfxn  . TUBAL LIGATION  1974    Outpatient Medications Prior to Visit  Medication Sig Dispense Refill  . acetaminophen (TYLENOL) 500 MG tablet Take 500 mg by mouth every 6 (six) hours as needed for mild pain, moderate pain, fever or headache.    . ADVAIR DISKUS 250-50 MCG/DOSE AEPB INHALE 1 PUFF INTO THE LUNGS TWICE DAILY 1 each 2  . albuterol (PROVENTIL HFA;VENTOLIN HFA) 108 (90 Base) MCG/ACT inhaler INHALE 2 PUFFS BY MOUTH EVERY 4 HOURS AS NEEDED 8.5 g 1  . amLODipine (NORVASC) 5 MG tablet Take 1 tablet (5 mg total) by mouth daily. 90 tablet 3  . aspirin EC 81 MG tablet Take 81 mg by mouth daily.    Marland Kitchen azelastine (ASTELIN) 0.1 % nasal spray Place 2 sprays into both nostrils 2 (two) times daily. Use in each nostril as directed 30 mL 3  . benzonatate (TESSALON) 200 MG capsule Take 1 capsule (200 mg total) by mouth 2 (two) times daily as needed for cough. 20 capsule 0  . cetirizine (ZYRTEC) 10 MG tablet Take 1 tablet (10 mg total) by mouth daily. (Patient taking differently: Take 10-20 mg by mouth daily as needed for allergies. ) 30 tablet 11  . diazepam (VALIUM) 5 MG tablet TAKE 1 TABLET BY MOUTH EVERY MORNING AND 2 TABLETS EVERY NIGHT AT BEDTIME 90 tablet 5  . doxycycline (VIBRAMYCIN) 100 MG capsule Take 1 capsule (100 mg total) by mouth 2 (two) times daily.  20 capsule 0  . estradiol (ESTRACE) 0.1 MG/GM vaginal cream APPLY 1/2 INCH RIBBON VAGINALLY QHS FOR 14 DAYS THEN TWICE WEEKLY    . fluticasone (FLONASE) 50 MCG/ACT nasal spray Place 1 spray into both nostrils daily. 16 g 2  . hyoscyamine (LEVSIN SL) 0.125 MG SL tablet 1-2 tabs po q6h prn lower abd pain and urgent loose stools 30 tablet 3  . Lactobacillus (PROBIOTIC ACIDOPHILUS) CAPS Take 2 capsules by mouth daily.    Marland Kitchen Lifitegrast (XIIDRA) 5 % SOLN Place 1 drop into both eyes 2 (two) times daily.    . Probiotic Product (PROBIOTIC DAILY PO) Take 2 capsules by mouth daily.     Marland Kitchen  roflumilast (DALIRESP) 500 MCG TABS tablet Take 1 tablet (500 mcg total) by mouth daily. 90 tablet 1  . SPIRIVA HANDIHALER 18 MCG inhalation capsule INHALE 1 CAPSULE VIA HANDIHALER EVERY DAY AT THE SAME TIME 30 capsule 6  . SYNTHROID 88 MCG tablet TAKE 1 TABLET BY MOUTH DAILY 30 tablet 5  . Tetrahydrozoline HCl (EYE DROPS OP) Apply to eye. SootheXP    . triamcinolone cream (KENALOG) 0.1 % Apply 1 application topically 2 (two) times daily. 30 g 0  . venlafaxine (EFFEXOR) 50 MG tablet TAKE 1 TABLET(50 MG) BY MOUTH TWICE DAILY 180 tablet 3  . predniSONE (DELTASONE) 20 MG tablet 2 tabs po qd x 5d 10 tablet 0  . albuterol (PROVENTIL) (2.5 MG/3ML) 0.083% nebulizer solution Take 3 mLs (2.5 mg total) by nebulization every 6 (six) hours as needed for wheezing or shortness of breath. 75 mL 5   No facility-administered medications prior to visit.     Allergies  Allergen Reactions  . Losartan Other (See Comments)    hyperkalemia  . Penicillins Hives, Swelling and Rash    Has patient had a PCN reaction causing immediate rash, facial/tongue/throat swelling, SOB or lightheadedness with hypotension: YES Has patient had a PCN reaction causing severe rash involving mucus membranes or skin necrosis: NO Has patient had a PCN reaction that required hospitalization NO Has patient had a PCN reaction occurring within the last 10 years:  NO If all of the above answers are "NO", then may proceed with Cephalosporin use.   . Cefixime [Kdc:Cefixime] Other (See Comments)    unspecified  . Indocin [Indomethacin] Other (See Comments)    Painful tongue and throat  . Sulfa Antibiotics Other (See Comments)    unspecified  . Ace Inhibitors Other (See Comments)    cough  . Statins Other (See Comments)    Myalgias     ROS As per HPI  PE: Blood pressure 128/75, pulse 75, temperature (!) 97.3 F (36.3 C), temperature source Oral, resp. rate 16, height 5\' 1"  (1.549 m), weight 141 lb (64 kg), SpO2 97 %. Gen: Alert, well appearing.  Patient is oriented to person, place, time, and situation. AFFECT: pleasant, lucid thought and speech. CZY:SAYT: no injection, icteris, swelling, or exudate.  EOMI, PERRLA. Mouth: lips without lesion/swelling.  Oral mucosa pink and moist. Oropharynx without erythema, exudate, or swelling.  Neck - No masses or thyromegaly or limitation in range of motion CV: RRR, no m/r/g.   LUNGS: CTA bilat, nonlabored resps, mildly prolonged exp phase AND some wheezing with forced exp maneuver. EXT: no clubbing, cyanosis, or edema.    LABS:  Lab Results  Component Value Date   TSH 1.65 09/29/2016   Lab Results  Component Value Date   WBC 10.3 05/14/2017   HGB 13.4 05/14/2017   HCT 40.4 05/14/2017   MCV 86.0 05/14/2017   PLT 370.0 05/14/2017   Lab Results  Component Value Date   CREATININE 0.72 09/29/2016   BUN 14 09/29/2016   NA 141 09/29/2016   K 4.0 09/29/2016   CL 106 09/29/2016   CO2 29 09/29/2016   Lab Results  Component Value Date   ALT 12 09/29/2016   AST 15 09/29/2016   ALKPHOS 77 09/29/2016   BILITOT 0.6 09/29/2016   Lab Results  Component Value Date   CHOL 193 06/18/2015   Lab Results  Component Value Date   HDL 61.30 06/18/2015   Lab Results  Component Value Date   LDLCALC 106 (H) 06/18/2015  Lab Results  Component Value Date   TRIG 124.0 06/18/2015   Lab Results   Component Value Date   CHOLHDL 3 06/18/2015   Lab Results  Component Value Date   HGBA1C 5.7 10/29/2011    IMPRESSION AND PLAN:  1) COPD exacerbation: question triggered by environmental allergies vs virus. No sign of bacterial infection.  Pt with hx of recurrent C. Diff, so I want to avoid antibiotic use unless very clearly necessary. Prednisone taper: 40mg  qd x 5d, then 20mg  qd x 5d, then 10mg  qd x 5d.. .  2) HTN: The current medical regimen is effective;  continue present plan and medications.  3) Hypothyroidism: The current medical regimen is effective;  continue present plan and medications.  4) Chronic anxiety, hx of recurrent MDD: The current medical regimen is effective;  continue present plan and medications.  No labs due today--we'll do them at next routine office f/u for chronic illness f/u.  An After Visit Summary was printed and given to the patient.  FOLLOW UP: Return in about 10 days (around 05/30/2017) for f/u resp.  Signed:  Crissie Sickles, MD           05/20/2017

## 2017-05-24 ENCOUNTER — Other Ambulatory Visit: Payer: Self-pay | Admitting: Family Medicine

## 2017-05-26 IMAGING — DX DG ABDOMEN 1V
2 series · 2 of 2 positions shown · non-contrast
Comparison: None.

CLINICAL DATA: Anal leakage that she states is not diarhhea x 2
weeks. Pt denies pain or nausea or vomiting. She states she had
shingles a couple of months ago but finished up her antibiotics x 1
month ago.

EXAM:
ABDOMEN - 1 VIEW

[abdomen kub (1 of 2)]
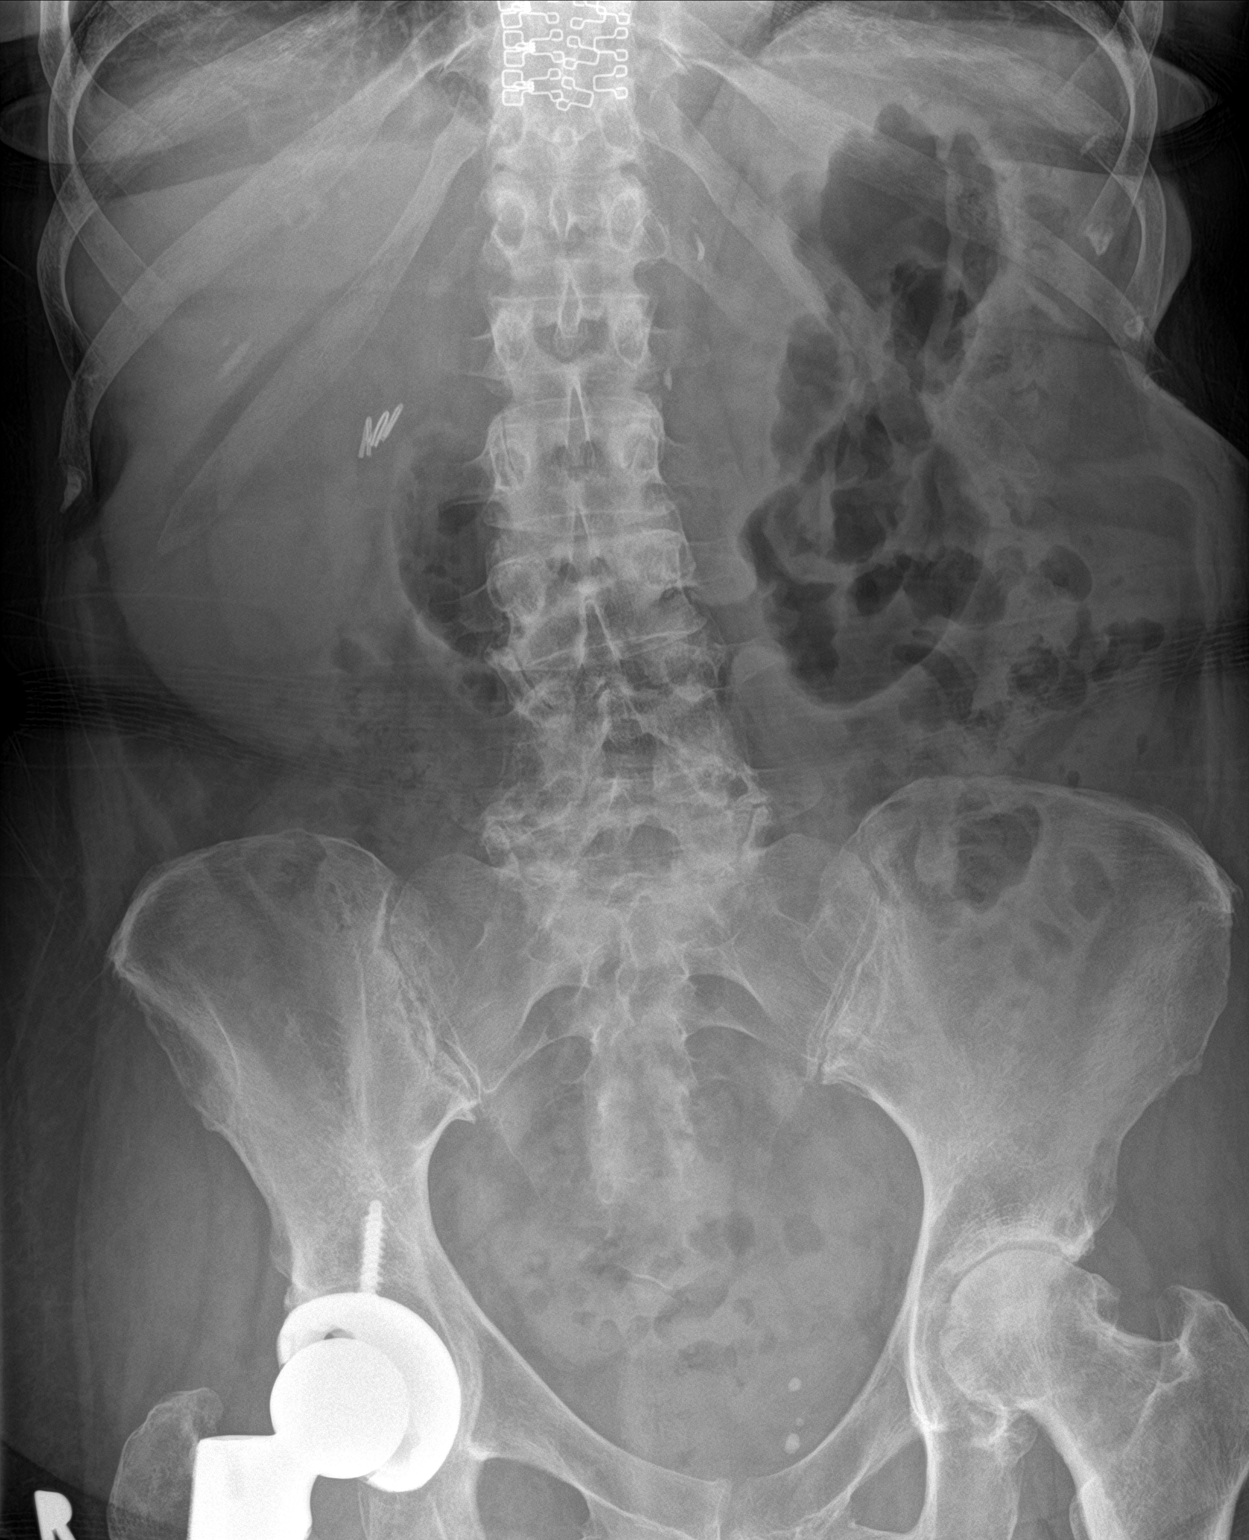

[abdomen kub (2 of 2)]
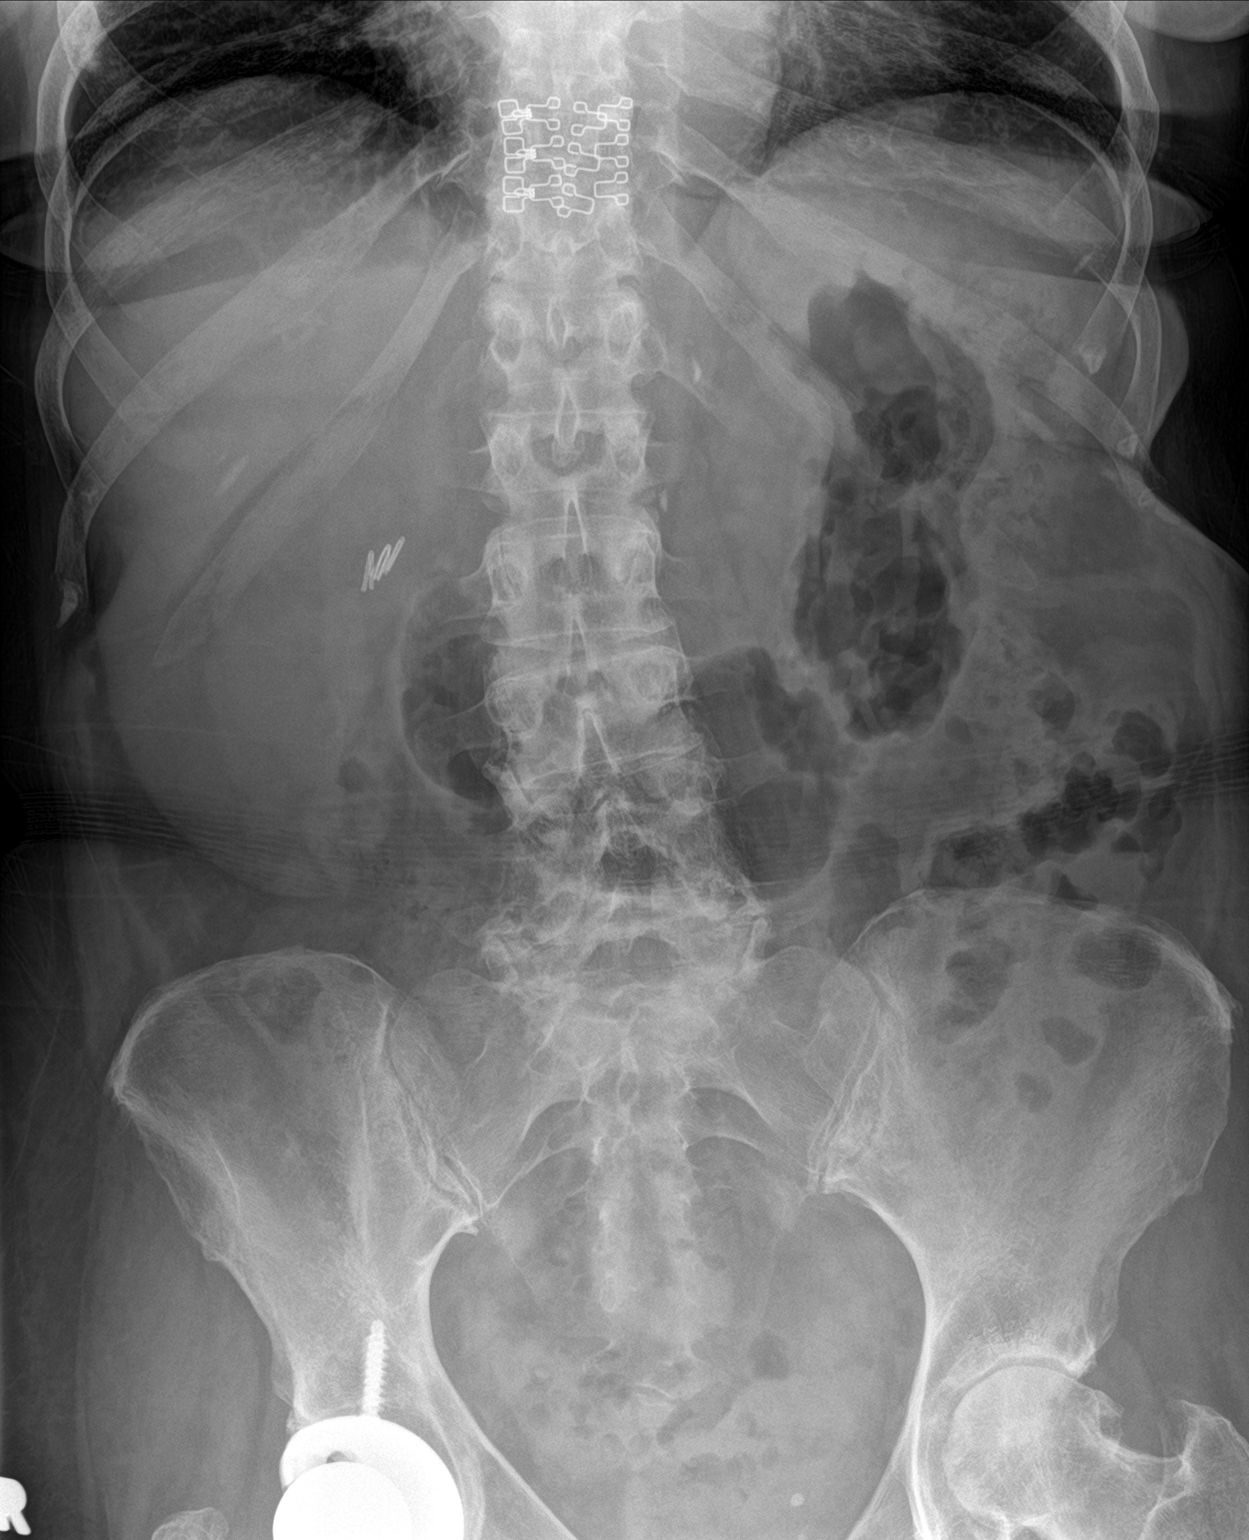

[2 of 2 positions shown; findings below may reference images not displayed]

FINDINGS: There is no bowel dilatation to suggest obstruction. There is no
evidence of pneumoperitoneum, portal venous gas or pneumatosis.
There are no pathologic calcifications along the expected course of
the ureters. There is a right total hip arthroplasty. There is
moderate osteoarthritis of the left hip. There is lumbar spine
spondylosis.
IMPRESSION: Unremarkable KUB.

## 2017-05-27 ENCOUNTER — Encounter: Payer: Self-pay | Admitting: Family Medicine

## 2017-05-27 NOTE — Telephone Encounter (Signed)
lmtcb x2 for pt. 

## 2017-05-28 ENCOUNTER — Telehealth: Payer: Self-pay | Admitting: Family Medicine

## 2017-05-28 ENCOUNTER — Encounter: Payer: Self-pay | Admitting: Family Medicine

## 2017-05-28 NOTE — Telephone Encounter (Signed)
Bone density test was normal. Her bones are strong! Continue vit D and calcium supplements.

## 2017-05-28 NOTE — Telephone Encounter (Signed)
Tried calling home phone, NA and unable to leave a message.

## 2017-05-29 ENCOUNTER — Encounter: Payer: Self-pay | Admitting: Family Medicine

## 2017-05-29 ENCOUNTER — Ambulatory Visit (INDEPENDENT_AMBULATORY_CARE_PROVIDER_SITE_OTHER): Payer: Medicare Other | Admitting: Family Medicine

## 2017-05-29 VITALS — BP 126/65 | HR 73 | Temp 98.1°F | Resp 16 | Ht 61.0 in | Wt 141.0 lb

## 2017-05-29 DIAGNOSIS — J441 Chronic obstructive pulmonary disease with (acute) exacerbation: Secondary | ICD-10-CM

## 2017-05-29 DIAGNOSIS — J301 Allergic rhinitis due to pollen: Secondary | ICD-10-CM | POA: Diagnosis not present

## 2017-05-29 MED ORDER — CLINDAMYCIN HCL 300 MG PO CAPS: ORAL_CAPSULE | ORAL | 2 refills | Status: DC

## 2017-05-29 MED ORDER — PREDNISONE 10 MG PO TABS: ORAL_TABLET | ORAL | 0 refills | Status: DC

## 2017-05-29 NOTE — Telephone Encounter (Signed)
Pt advised and voiced understanding.   

## 2017-05-29 NOTE — Progress Notes (Signed)
OFFICE VISIT  05/29/2017   CC:  Chief Complaint  Patient presents with  . Follow-up    Resp. Illness   HPI:    Patient is a 82 y.o. Caucasian female who presents for 10 d f/u respiratory illness. I dx'd her with a COPD exacerbation that was likely triggered by worsened allergic rhinitis or viral URI. I rx'd her a 15d prednisone taper. Cough and work of breathing is improved.  She got outside in the daytime for 2 days recently and says she did pretty well with this. No runny nose.  Occ blows some thick phelgm out of her nose and occ coughs some up from back of throat.  No fever. Wears her oxygen at night.  She has not felt like she has needed her oxygen at home. Occ feeling of SOB---she takes 3 deep breaths and this resolves.  Climbed stairs today and had no problems. Feels like nose is clearing up some.  Past Medical History:  Diagnosis Date  . Asthma   . Chest pain, non-cardiac    Cardiac CT showed no signif obstructive dz, mildly elevated calcium level (Dr. [name]).  . Chronic cystitis with hematuria    [name]  . Chronic hypoxemic respiratory failure ([place]) 02/02/2015   2L oxygen 24/7  . COPD (chronic obstructive pulmonary disease) ([place])    spirometry 01/10/09 FEV 0.97(52%), FEV1% 47  . DDD (degenerative disc disease), cervical    1998 MRI C5-6 impingemt (left)  . DDD (degenerative disc disease), lumbar   . Depression with anxiety 04/15/2011  . Diarrheal disease Summer 2017   ? 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).  As of 10/05/15 pt reports GI dx'd her with C diff and rx'd more flagyl and she is also on cholestyramine.  As of 02/2016, pt's sx's resolved completely.  . Diverticulosis of colon    Procto '97 and colonoscopy 2002  . Dysplastic nevus of upper extremity 04/2014   R tricep ([name])  . Family history of adverse reaction to anesthesia    mother died when patient age 53 receiving anesthesia for thyroid surgery  . Fibrocystic breast disease    w/fibroadenoma.  Bx's showed NO atypia (Dr. Margot Chimes).  Mammo neg 2008.  Marland Kitchen GERD (gastroesophageal reflux disease)   . History of double vision    Ophthalmologist, Dr. Herbert Deaner, is further evaluating this with MRI  orbits and limited brain (myesthenia gravis testing neg 07/2010)  . History of home oxygen therapy    uses 2 liters at hs  . Hypertension    EKG 03/2010 normal  . Hypothyroidism    Hashimoto's, dx'd 1989  . Hypoxemia    O2 86% RA with exertion 01/10/09, 2 liters oxygen with exertion and sleep  . Lesion of right native kidney 2017   found on CT 02/2015.  F/u MRI 03/31/15 showed it to be smaller and less likely of any significance but repeat CT renal protocol in 72mo was recommended by radiology.  This was done 10/26/15 and showed no interval change in the lesion, but b/c the lesion enhances with contrast, radiol suggested urol consultation (b/c papillary renal cell ca could not be excluded).  Saw urol 03/06/16--stable on u/s 09/2016  . Osteoarthritis, hip, bilateral    End stage; R THA  . Postmenopausal atrophic vaginitis    improved with estrace 2 X/week.  . Pulmonary nodule    CT chest 12/10, June 2011.  No  nodule on CT 06/2010.  Marland Kitchen Recurrent Clostridium difficile diarrhea    Episode 09/19/2016: resolved with prolonged course of flagyl.  11/2016 recurrence treated with 10d of Dificid (Fidaxomicin) by GI (Dr. Earlean Shawl).  . Recurrent UTI    likely related to pt's atrophic vaginitis.  Urol started vaginal estrace 03/2016.  Marland Kitchen Shingles 02/2014   R abd/flank  . 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 Upper airway cough syndrome     Past Surgical History:  Procedure Laterality Date  . ABDOMINAL HYSTERECTOMY  1978   no BSO per pt--nonmalignant reasons  . APPENDECTOMY    . BREAST CYST EXCISION     Last screening mammogram 10/2010 was  normal.  . CHOLECYSTECTOMY  2001  . COLONOSCOPY  04/2005; 12/06/2015   2007 NORMAL.  2017 adenomatous polyp x 1.  Mod sigmoid diverticulosis (Dr. Earlean Shawl).    Marland Kitchen DEXA  05/18/2017   NORMAL (T-score 0.1)  . HIP CLOSED REDUCTION Right 12/25/2015   Procedure: CLOSED REDUCTION HIP;  Surgeon: Nicholes Stairs, MD;  Location: [facility];  Service: Orthopedics;  Laterality: Right;  . TONSILLECTOMY    . TOTAL HIP ARTHROPLASTY Right 11/01/2014   Procedure: RIGHT TOTAL HIP ARTHROPLASTY;  Surgeon: Latanya Maudlin, MD;  Location: [facility];  Service: Orthopedics;  Laterality: Right;  . TRANSTHORACIC ECHOCARDIOGRAM  08/2014   EF 60-65%, grade I diast dysfxn  . TUBAL LIGATION  1974    Outpatient Medications Prior to Visit  Medication Sig Dispense Refill  . acetaminophen (TYLENOL) 500 MG tablet Take 500 mg by mouth every 6 (six) hours as needed for mild pain, moderate pain, fever or headache.    . ADVAIR DISKUS 250-50 MCG/DOSE AEPB INHALE 1 PUFF INTO THE LUNGS TWICE DAILY 1 each 2  . albuterol (PROVENTIL HFA;VENTOLIN HFA) 108 (90 Base) MCG/ACT inhaler INHALE 2 PUFFS BY MOUTH EVERY 4 HOURS AS NEEDED 8.5 g 1  . albuterol (PROVENTIL) (2.5 MG/3ML) 0.083% nebulizer solution INHALE 1 VIAL VIA NEBULIZER EVERY 6 HOURS AS NEEDED FOR WHEEZING OR SHORTNESS OF BREATH 75 mL 0  . amLODipine (NORVASC) 5 MG tablet Take 1 tablet (5 mg total) by mouth daily. 90 tablet 3  . aspirin EC 81 MG tablet Take 81 mg by mouth daily.    Marland Kitchen azelastine (ASTELIN) 0.1 % nasal spray Place 2 sprays into both nostrils 2 (two) times daily. Use in each nostril as directed 30 mL 3  . cetirizine (ZYRTEC) 10 MG tablet Take 1 tablet (10 mg total) by mouth daily. (Patient taking differently: Take 10-20 mg by mouth daily as needed for allergies. ) 30 tablet 11  . diazepam (VALIUM) 5 MG tablet TAKE 1 TABLET BY MOUTH EVERY MORNING AND 2 TABLETS EVERY NIGHT AT BEDTIME 90 tablet 5  . doxycycline (VIBRAMYCIN) 100 MG capsule Take 1 capsule (100 mg total) by mouth 2  (two) times daily. 20 capsule 0  . estradiol (ESTRACE) 0.1 MG/GM vaginal cream APPLY 1/2 INCH RIBBON VAGINALLY QHS FOR 14 DAYS THEN TWICE WEEKLY    . fluticasone (FLONASE) 50 MCG/ACT nasal spray Place 1 spray into both nostrils daily. 16 g 2  . hyoscyamine (LEVSIN SL) 0.125 MG SL tablet 1-2 tabs po q6h prn lower abd pain and urgent loose stools 30 tablet 3  . Lactobacillus (PROBIOTIC ACIDOPHILUS) CAPS Take 2 capsules by mouth daily.    Marland Kitchen Lifitegrast (XIIDRA) 5 % SOLN Place 1 drop into both eyes 2 (two) times daily.    Marland Kitchen  predniSONE (DELTASONE) 10 MG tablet 4 tabs po qd x 5d, then 2 tabs po qd x 5d, then 1 tab po qd x 5d 35 tablet 0  . Probiotic Product (PROBIOTIC DAILY PO) Take 2 capsules by mouth daily.     . roflumilast (DALIRESP) 500 MCG TABS tablet Take 1 tablet (500 mcg total) by mouth daily. 90 tablet 1  . SPIRIVA HANDIHALER 18 MCG inhalation capsule INHALE 1 CAPSULE VIA HANDIHALER EVERY DAY AT THE SAME TIME 30 capsule 6  . SYNTHROID 88 MCG tablet TAKE 1 TABLET BY MOUTH DAILY 30 tablet 5  . Tetrahydrozoline HCl (EYE DROPS OP) Apply to eye. SootheXP    . triamcinolone cream (KENALOG) 0.1 % Apply 1 application topically 2 (two) times daily. 30 g 0  . venlafaxine (EFFEXOR) 50 MG tablet TAKE 1 TABLET(50 MG) BY MOUTH TWICE DAILY 180 tablet 3  . benzonatate (TESSALON) 200 MG capsule Take 1 capsule (200 mg total) by mouth 2 (two) times daily as needed for cough. (Patient not taking: Reported on 05/29/2017) 20 capsule 0   No facility-administered medications prior to visit.     Allergies  Allergen Reactions  . Losartan Other (See Comments)    hyperkalemia  . Penicillins Hives, Swelling and Rash    Has patient had a PCN reaction causing immediate rash, facial/tongue/throat swelling, SOB or lightheadedness with hypotension: YES Has patient had a PCN reaction causing severe rash involving mucus membranes or skin necrosis: NO Has patient had a PCN reaction that required hospitalization NO Has  patient had a PCN reaction occurring within the last 10 years: NO If all of the above answers are "NO", then may proceed with Cephalosporin use.   . Cefixime [Kdc:Cefixime] Other (See Comments)    unspecified  . Indocin [Indomethacin] Other (See Comments)    Painful tongue and throat  . Sulfa Antibiotics Other (See Comments)    unspecified  . Ace Inhibitors Other (See Comments)    cough  . Statins Other (See Comments)    Myalgias     ROS As per HPI  PE: Blood pressure 126/65, pulse 73, temperature 98.1 F (36.7 C), temperature source Oral, resp. rate 16, height 5\' 1"  (1.549 m), weight 141 lb (64 kg), SpO2 (!) 89 %.Room air Gen: Alert, well appearing.  Patient is oriented to person, place, time, and situation. AFFECT: pleasant, lucid thought and speech. ENT: Ears: EACs clear, normal epithelium.  TMs with good light reflex and landmarks bilaterally.  Eyes: no injection, icteris, swelling, or exudate.  EOMI, PERRLA. Nose: no drainage or turbinate edema/swelling.  No injection or focal lesion.  Mouth: lips without lesion/swelling.  Oral mucosa pink and moist.  Dentition intact and without obvious caries or gingival swelling.  Oropharynx without erythema, exudate, or swelling.  CV: RRR, no m/r/g.   LUNGS: CTA bilat, nonlabored resps, good aeration in all lung fields. EXT: no c/c/e  LABS:    Chemistry      Component Value Date/Time   NA 141 09/29/2016 1013   K 4.0 09/29/2016 1013   CL 106 09/29/2016 1013   CO2 29 09/29/2016 1013   BUN 14 09/29/2016 1013   CREATININE 0.72 09/29/2016 1013   CREATININE 0.84 02/27/2016 1533      Component Value Date/Time   CALCIUM 9.5 09/29/2016 1013   ALKPHOS 77 09/29/2016 1013   AST 15 09/29/2016 1013   ALT 12 09/29/2016 1013   BILITOT 0.6 09/29/2016 1013       IMPRESSION AND PLAN:  Allergic  rhinitis (recent worsening) with acute exacerbation of COPD. Improving appropriately.  She often takes quite a while to get back to baseline during  times like this. She'll finish her current taper (5 more days) but if she goes a couple days after this and begins to feel return of sx's, I sent in a rx for prednisone to fill to continue taper (20mg  qd x 3d then 10mg  qd x 3d).  I ALSO encouraged pt to make appt at that time at her discretion.  She has vast experience with her physical status in the past and has good judgement on when to come in or go do ED, or when to just stay home.  She has hx of hip replacement and has dental procedure coming up: I sent in clindamycin 300mg , 2 tabs x 1d to take 1 hour prior to procedure (penicillin allergy).  An After Visit Summary was printed and given to the patient.   FOLLOW UP: No follow-ups on file.

## 2017-05-29 NOTE — Telephone Encounter (Signed)
Left several messages for pt to return call regarding samples of daliresp ready for p/u in office Mailing letter today to remind to contact our office Nothing further needed at this time

## 2017-06-03 ENCOUNTER — Encounter: Payer: Self-pay | Admitting: Family Medicine

## 2017-06-03 ENCOUNTER — Ambulatory Visit (INDEPENDENT_AMBULATORY_CARE_PROVIDER_SITE_OTHER): Payer: Medicare Other | Admitting: Family Medicine

## 2017-06-03 ENCOUNTER — Ambulatory Visit (HOSPITAL_BASED_OUTPATIENT_CLINIC_OR_DEPARTMENT_OTHER)
Admission: RE | Admit: 2017-06-03 | Discharge: 2017-06-03 | Disposition: A | Discharge status: Home / Self Care | Payer: Medicare Other | Source: Ambulatory Visit | Attending: Family Medicine | Admitting: Family Medicine

## 2017-06-03 VITALS — BP 112/58 | HR 82 | Temp 98.2°F | Resp 20 | Ht 61.0 in | Wt 141.0 lb

## 2017-06-03 DIAGNOSIS — J0100 Acute maxillary sinusitis, unspecified: Secondary | ICD-10-CM

## 2017-06-03 DIAGNOSIS — R05 Cough: Secondary | ICD-10-CM

## 2017-06-03 DIAGNOSIS — R059 Cough, unspecified: Secondary | ICD-10-CM

## 2017-06-03 DIAGNOSIS — J441 Chronic obstructive pulmonary disease with (acute) exacerbation: Secondary | ICD-10-CM | POA: Insufficient documentation

## 2017-06-03 MED ORDER — IPRATROPIUM-ALBUTEROL 0.5-2.5 (3) MG/3ML IN SOLN
3.0000 mL | Freq: Once | RESPIRATORY_TRACT | Status: AC
  Administered 2017-06-03: 3 mL via RESPIRATORY_TRACT

## 2017-06-03 MED ORDER — AZITHROMYCIN 250 MG PO TABS: ORAL_TABLET | ORAL | 0 refills | Status: DC

## 2017-06-03 MED ORDER — ALBUTEROL SULFATE (2.5 MG/3ML) 0.083% IN NEBU: INHALATION_SOLUTION | RESPIRATORY_TRACT | 1 refills | Status: DC

## 2017-06-03 NOTE — Progress Notes (Signed)
OFFICE VISIT  06/15/2017   CC:  Chief Complaint  Patient presents with  . Cough   HPI:    Patient is a 82 y.o. Caucasian female who presents for coughing and nasal drainage. Took the prednisone for 5 more days after last visit, has not filled the rx I gave to fill if she got worse (yet). Lots of nasal mucous, loud wheezing, had to sleep in recliner.  Yesterday her nose started to run, yellowish thick and gooey. She stresses that she feels like her sx's are most prominent from nose to back of throat. Using astelin, mucinex, zyrted. Using albuterol neb prior to going to bed.  Most mornings as well.  Uses inhaler multiple times per day as well. No alb neb used today.  No fever. Has had to wear 2L oxygen for the last 4-5d, although her oxygen sat at rest and with ambulation on RA at home was 93-94%.   Past Medical History:  Diagnosis Date  . Asthma   . Chest pain, non-cardiac    Cardiac CT showed no signif obstructive dz, mildly elevated calcium level (Dr. [name]).  . Chronic cystitis with hematuria    [name]  . Chronic hypoxemic respiratory failure ([place]) 02/02/2015   2L oxygen 24/7  . COPD (chronic obstructive pulmonary disease) ([place])    spirometry 01/10/09 FEV 0.97(52%), FEV1% 47  . DDD (degenerative disc disease), cervical    1998 MRI C5-6 impingemt (left)  . DDD (degenerative disc disease), lumbar   . Depression with anxiety 04/15/2011  . Diarrheal disease Summer 2017   ? 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).  As of 10/05/15 pt reports GI dx'd her with C diff and rx'd more flagyl and she is also on cholestyramine.  As of 02/2016, pt's sx's resolved completely.  . Diverticulosis of colon    Procto '97 and colonoscopy 2002  . Dysplastic nevus of upper extremity 04/2014   R tricep ([name])  . Family history of adverse reaction to anesthesia    mother died  when patient age 89 receiving anesthesia for thyroid surgery  . Fibrocystic breast disease    w/fibroadenoma.  Bx's showed NO atypia (Dr. Margot Chimes).  Mammo neg 2008.  Marland Kitchen GERD (gastroesophageal reflux disease)   . History of double vision    Ophthalmologist, Dr. Herbert Deaner, is further evaluating this with MRI  orbits and limited brain (myesthenia gravis testing neg 07/2010)  . History of home oxygen therapy    uses 2 liters at hs  . Hypertension    EKG 03/2010 normal  . Hypothyroidism    Hashimoto's, dx'd 1989  . Hypoxemia    O2 86% RA with exertion 01/10/09, 2 liters oxygen with exertion and sleep  . Lesion of right native kidney 2017   found on CT 02/2015.  F/u MRI 03/31/15 showed it to be smaller and less likely of any significance but repeat CT renal protocol in 33mo was recommended by radiology.  This was done 10/26/15 and showed no interval change in the lesion, but b/c the lesion enhances with contrast, radiol suggested urol consultation (b/c papillary renal cell ca could not be excluded).  Saw urol 03/06/16--stable on u/s 09/2016  . Osteoarthritis, hip, bilateral    End stage; R THA  . Postmenopausal atrophic vaginitis    improved with estrace 2 X/week.  . Pulmonary nodule    CT chest 12/10, June 2011.  No nodule on  CT 06/2010.  Marland Kitchen Recurrent Clostridium difficile diarrhea    Episode 09/19/2016: resolved with prolonged course of flagyl.  11/2016 recurrence treated with 10d of Dificid (Fidaxomicin) by GI (Dr. Earlean Shawl).  . Recurrent UTI    likely related to pt's atrophic vaginitis.  Urol started vaginal estrace 03/2016.  Marland Kitchen Shingles 02/2014   R abd/flank  . 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 Upper airway cough syndrome     Past Surgical History:  Procedure Laterality Date  . ABDOMINAL HYSTERECTOMY  1978   no BSO per pt--nonmalignant reasons  . APPENDECTOMY    . BREAST CYST EXCISION     Last screening mammogram 10/2010 was normal.  .  CHOLECYSTECTOMY  2001  . COLONOSCOPY  04/2005; 12/06/2015   2007 NORMAL.  2017 adenomatous polyp x 1.  Mod sigmoid diverticulosis (Dr. Earlean Shawl).    Marland Kitchen DEXA  05/18/2017   NORMAL (T-score 0.1)  . HIP CLOSED REDUCTION Right 12/25/2015   Procedure: CLOSED REDUCTION HIP;  Surgeon: Nicholes Stairs, MD;  Location: [facility];  Service: Orthopedics;  Laterality: Right;  . TONSILLECTOMY    . TOTAL HIP ARTHROPLASTY Right 11/01/2014   Procedure: RIGHT TOTAL HIP ARTHROPLASTY;  Surgeon: Latanya Maudlin, MD;  Location: [facility];  Service: Orthopedics;  Laterality: Right;  . TRANSTHORACIC ECHOCARDIOGRAM  08/2014   EF 60-65%, grade I diast dysfxn  . TUBAL LIGATION  1974    Outpatient Medications Prior to Visit  Medication Sig Dispense Refill  . acetaminophen (TYLENOL) 500 MG tablet Take 500 mg by mouth every 6 (six) hours as needed for mild pain, moderate pain, fever or headache.    . albuterol (PROVENTIL HFA;VENTOLIN HFA) 108 (90 Base) MCG/ACT inhaler INHALE 2 PUFFS BY MOUTH EVERY 4 HOURS AS NEEDED 8.5 g 1  . amLODipine (NORVASC) 5 MG tablet Take 1 tablet (5 mg total) by mouth daily. 90 tablet 3  . aspirin EC 81 MG tablet Take 81 mg by mouth daily.    Marland Kitchen azelastine (ASTELIN) 0.1 % nasal spray Place 2 sprays into both nostrils 2 (two) times daily. Use in each nostril as directed 30 mL 3  . cetirizine (ZYRTEC) 10 MG tablet Take 1 tablet (10 mg total) by mouth daily. (Patient taking differently: Take 10-20 mg by mouth daily as needed for allergies. ) 30 tablet 11  . diazepam (VALIUM) 5 MG tablet TAKE 1 TABLET BY MOUTH EVERY MORNING AND 2 TABLETS EVERY NIGHT AT BEDTIME 90 tablet 5  . estradiol (ESTRACE) 0.1 MG/GM vaginal cream APPLY 1/2 INCH RIBBON VAGINALLY QHS FOR 14 DAYS THEN TWICE WEEKLY    . fluticasone (FLONASE) 50 MCG/ACT nasal spray Place 1 spray into both nostrils daily. 16 g 2  . hyoscyamine (LEVSIN SL) 0.125 MG SL tablet 1-2 tabs po q6h prn lower abd pain and urgent loose stools 30 tablet 3  .  Lactobacillus (PROBIOTIC ACIDOPHILUS) CAPS Take 2 capsules by mouth daily.    Marland Kitchen Lifitegrast (XIIDRA) 5 % SOLN Place 1 drop into both eyes 2 (two) times daily.    . Probiotic Product (PROBIOTIC DAILY PO) Take 2 capsules by mouth daily.     . roflumilast (DALIRESP) 500 MCG TABS tablet Take 1 tablet (500 mcg total) by mouth daily. 90 tablet 1  . SPIRIVA HANDIHALER 18 MCG inhalation capsule INHALE 1 CAPSULE VIA HANDIHALER EVERY DAY AT THE SAME TIME 30 capsule 6  . SYNTHROID 88 MCG tablet TAKE 1 TABLET BY MOUTH  DAILY 30 tablet 5  . Tetrahydrozoline HCl (EYE DROPS OP) Apply to eye. SootheXP    . triamcinolone cream (KENALOG) 0.1 % Apply 1 application topically 2 (two) times daily. 30 g 0  . venlafaxine (EFFEXOR) 50 MG tablet TAKE 1 TABLET(50 MG) BY MOUTH TWICE DAILY 180 tablet 3  . ADVAIR DISKUS 250-50 MCG/DOSE AEPB INHALE 1 PUFF INTO THE LUNGS TWICE DAILY 1 each 2  . albuterol (PROVENTIL) (2.5 MG/3ML) 0.083% nebulizer solution INHALE 1 VIAL VIA NEBULIZER EVERY 6 HOURS AS NEEDED FOR WHEEZING OR SHORTNESS OF BREATH 75 mL 0  . predniSONE (DELTASONE) 10 MG tablet 2 tabs po qd x 3d, then 1 tab po qd x 3d 9 tablet 0  . clindamycin (CLEOCIN) 300 MG capsule 2 tabs po x 1 dose (1 hour prior to dental procedure) 2 capsule 2  . benzonatate (TESSALON) 200 MG capsule Take 1 capsule (200 mg total) by mouth 2 (two) times daily as needed for cough. (Patient not taking: Reported on 05/29/2017) 20 capsule 0  . doxycycline (VIBRAMYCIN) 100 MG capsule Take 1 capsule (100 mg total) by mouth 2 (two) times daily. (Patient not taking: Reported on 06/03/2017) 20 capsule 0   No facility-administered medications prior to visit.     Allergies  Allergen Reactions  . Losartan Other (See Comments)    hyperkalemia  . Penicillins Hives, Swelling and Rash    Has patient had a PCN reaction causing immediate rash, facial/tongue/throat swelling, SOB or lightheadedness with hypotension: YES Has patient had a PCN reaction causing severe  rash involving mucus membranes or skin necrosis: NO Has patient had a PCN reaction that required hospitalization NO Has patient had a PCN reaction occurring within the last 10 years: NO If all of the above answers are "NO", then may proceed with Cephalosporin use.   . Cefixime [Kdc:Cefixime] Other (See Comments)    unspecified  . Indocin [Indomethacin] Other (See Comments)    Painful tongue and throat  . Sulfa Antibiotics Other (See Comments)    unspecified  . Ace Inhibitors Other (See Comments)    cough  . Statins Other (See Comments)    Myalgias     ROS As per HPI  PE: Blood pressure (!) 112/58, pulse 82, temperature 98.2 F (36.8 C), temperature source Oral, resp. rate 20, height 5\' 1"  (1.549 m), weight 141 lb (64 kg), SpO2 98 %. Gen: appears tired but in NAD.   AFFECT: pleasant, lucid thought and speech. HEENT: eyes without injection, drainage, or swelling.  Ears: EACs clear, TMs with normal light reflex and landmarks.  Nose: Clear rhinorrhea, with some dried, crusty exudate adherent to mildly injected mucosa.  No purulent d/c.  No paranasal sinus TTP.  No facial swelling.  Throat and mouth without focal lesion.  No pharyngial swelling, erythema, or exudate.   Neck: supple, no LAD.   LUNGS: CTA bilat on inspiration, with diffuse exp wheeze, RR 30/min, mildly llabored resps.   CV: RRR, no m/r/g. EXT: no c/c/e SKIN: no rash   LABS:    Chemistry      Component Value Date/Time   NA 141 09/29/2016 1013   K 4.0 09/29/2016 1013   CL 106 09/29/2016 1013   CO2 29 09/29/2016 1013   BUN 14 09/29/2016 1013   CREATININE 0.72 09/29/2016 1013   CREATININE 0.84 02/27/2016 1533      Component Value Date/Time   CALCIUM 9.5 09/29/2016 1013   ALKPHOS 77 09/29/2016 1013   AST 15 09/29/2016 1013  ALT 12 09/29/2016 1013   BILITOT 0.6 09/29/2016 1013     Lab Results  Component Value Date   WBC 10.3 05/14/2017   HGB 13.4 05/14/2017   HCT 40.4 05/14/2017   MCV 86.0 05/14/2017    PLT 370.0 05/14/2017   05/11/17 DG chest 2 view: CHEST - 2 VIEW  COMPARISON:  04/06/2017 chest radiograph  FINDINGS: Stable cardiac silhouette within normal limits given projection and technique. Aortic atherosclerosis with calcification. Stable chronic lung changes. No focal consolidation. No pleural effusion or pneumothorax. No acute osseous abnormality is evident.  IMPRESSION: Stable chronic lung changes. No focal consolidation. Aortic Atherosclerosis  IMPRESSION AND PLAN:  URI/sinusitis (this part worsening), with ongoing COPD exacerbation. Alb/atr neb in office today:some improvement in RR and work of breathing was noted. CXR today. She'll fill the most recent prednisone rx I gave at last visit to hold and fill if necessary. Zpack rx'd. Increase use of albuterol neb to q4h prn.  An After Visit Summary was printed and given to the patient.   FOLLOW UP: Return in about 4 days (around 06/07/2017).   Signed:  Crissie Sickles, MD           06/15/2017

## 2017-06-04 ENCOUNTER — Telehealth: Payer: Self-pay | Admitting: Family Medicine

## 2017-06-04 NOTE — Telephone Encounter (Signed)
Pt calling for xray results. Please advise. Thanks.

## 2017-06-04 NOTE — Telephone Encounter (Signed)
Copied from [place] [id number]. Topic: Quick Communication - See Telephone Encounter >> Jun 04, 2017  4:03 PM Cleaster Corin, NT wrote: CRM for notification. See Telephone encounter for: 06/04/17.  Pt. Calling to receive x ray results pt. Can be reached at [phone]

## 2017-06-05 NOTE — Telephone Encounter (Signed)
See result note. Pt was advised.  

## 2017-06-08 ENCOUNTER — Ambulatory Visit (INDEPENDENT_AMBULATORY_CARE_PROVIDER_SITE_OTHER): Payer: Medicare Other | Admitting: Pulmonary Disease

## 2017-06-08 ENCOUNTER — Ambulatory Visit: Payer: Medicare Other | Admitting: Family Medicine

## 2017-06-08 ENCOUNTER — Encounter: Payer: Self-pay | Admitting: Pulmonary Disease

## 2017-06-08 ENCOUNTER — Other Ambulatory Visit: Payer: Self-pay | Admitting: Pulmonary Disease

## 2017-06-08 VITALS — BP 122/78 | HR 76 | Ht 61.0 in | Wt 142.0 lb

## 2017-06-08 DIAGNOSIS — J441 Chronic obstructive pulmonary disease with (acute) exacerbation: Secondary | ICD-10-CM | POA: Diagnosis not present

## 2017-06-08 MED ORDER — LEVOFLOXACIN 500 MG PO TABS: 500.0000 mg | ORAL_TABLET | Freq: Every day | ORAL | 0 refills | Status: DC

## 2017-06-08 MED ORDER — PREDNISONE 10 MG PO TABS: ORAL_TABLET | ORAL | 0 refills | Status: DC

## 2017-06-08 NOTE — Patient Instructions (Signed)
Prednisone 10 mg tabs Take 4 tabs  daily with food x 5 days, then 3 tabs daily x 5 days, then 2 tabs daily x 5 days, then 1 tab daily x 5days then stop. #50  Levaquin 500 mg daily for 5 days. Take probiotic with this or yogurt.  Okay to use albuterol nebs every 6 hours as needed for wheezing. Check sputum culture today

## 2017-06-08 NOTE — Progress Notes (Signed)
Subjective:    Patient ID: Priscilla Cantrell, female    DOB: [birth year], 82 y.o.   MRN: [id number]  HPI  82 year old ex-smoker with goal for COPD and nocturnal  Oxygen.  Seems like her current illness has been ongoing since February 2019, she has received Z-Pak and short course of prednisone and then at another time doxycycline short course of prednisone.  Note multiple visits to her PCP, also reviewedER visit 05/2017 given doxycycline and prednisone.   labs from 05/2017 -unremarkable. Chest x-ray 06/03/2017 shows bibasilar infiltrate/atelectasis. CT abdomen from 2017 does not show any bibasilar disease.  Acute visit today -June episode of wheezing last night Required nebs and she almost went back to emergency room. She feels that congestion is right there points to between her nose and throat, complains of thick yellow sputum. She has been using her oxygen daily   She has a history of C. difficile infection in the past    Significant tests/ events reviewed  Spirometry 01/10/09>>FEV1 0.97(52%), FEV1% 47  Past Medical History:  Diagnosis Date  . Asthma   . Chest pain, non-cardiac    Cardiac CT showed no signif obstructive dz, mildly elevated calcium level (Dr. [name]).  . Chronic cystitis with hematuria    [name]  . Chronic hypoxemic respiratory failure ([place]) 02/02/2015   2L oxygen 24/7  . COPD (chronic obstructive pulmonary disease) ([place])    spirometry 01/10/09 FEV 0.97(52%), FEV1% 47  . DDD (degenerative disc disease), cervical    1998 MRI C5-6 impingemt (left)  . DDD (degenerative disc disease), lumbar   . Depression with anxiety 04/15/2011  . Diarrheal disease Summer 2017   ? 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).  As of 10/05/15 pt reports GI dx'd her with C diff and rx'd more flagyl and she is also on cholestyramine.  As of 02/2016, pt's sx's resolved  completely.  . Diverticulosis of colon    Procto '97 and colonoscopy 2002  . Dysplastic nevus of upper extremity 04/2014   R tricep ([name])  . Family history of adverse reaction to anesthesia    mother died when patient age 4 receiving anesthesia for thyroid surgery  . Fibrocystic breast disease    w/fibroadenoma.  Bx's showed NO atypia (Dr. Margot Chimes).  Mammo neg 2008.  Marland Kitchen GERD (gastroesophageal reflux disease)   . History of double vision    Ophthalmologist, Dr. Herbert Deaner, is further evaluating this with MRI  orbits and limited brain (myesthenia gravis testing neg 07/2010)  . History of home oxygen therapy    uses 2 liters at hs  . Hypertension    EKG 03/2010 normal  . Hypothyroidism    Hashimoto's, dx'd 1989  . Hypoxemia    O2 86% RA with exertion 01/10/09, 2 liters oxygen with exertion and sleep  . Lesion of right native kidney 2017   found on CT 02/2015.  F/u MRI 03/31/15 showed it to be smaller and less likely of any significance but repeat CT renal protocol in 40mo was recommended by radiology.  This was done 10/26/15 and showed no interval change in the lesion, but b/c the lesion enhances with contrast, radiol suggested urol consultation (b/c papillary renal cell ca could not be excluded).  Saw urol 03/06/16--stable on u/s 09/2016  . Osteoarthritis, hip, bilateral    End stage; R THA  . Postmenopausal atrophic vaginitis    improved with estrace 2 X/week.  Marland Kitchen  Pulmonary nodule    CT chest 12/10, June 2011.  No nodule on CT 06/2010.  Marland Kitchen Recurrent Clostridium difficile diarrhea    Episode 09/19/2016: resolved with prolonged course of flagyl.  11/2016 recurrence treated with 10d of Dificid (Fidaxomicin) by GI (Dr. Earlean Shawl).  . Recurrent UTI    likely related to pt's atrophic vaginitis.  Urol started vaginal estrace 03/2016.  Marland Kitchen Shingles 02/2014   R abd/flank  . 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 Upper airway cough syndrome       Review of Systems neg for any significant sore throat, dysphagia, itching, sneezing, nasal congestion or excess/ purulent secretions, fever, chills, sweats, unintended wt loss, pleuritic or exertional cp, hempoptysis, orthopnea pnd or change in chronic leg swelling. Also denies presyncope, palpitations, heartburn, abdominal pain, nausea, vomiting, diarrhea or change in bowel or urinary habits, dysuria,hematuria, rash, arthralgias, visual complaints, headache, numbness weakness or ataxia.     Objective:   Physical Exam   Gen. Pleasant, well-nourished, in no distress ENT - no thrush, no post nasal drip Neck: No JVD, no thyromegaly, no carotid bruits Lungs: no use of accessory muscles, no dullness to percussion, left basal rales no rhonchi , upper airway pseudowheeze + Cardiovascular: Rhythm regular, heart sounds  normal, no murmurs or gallops, no peripheral edema Musculoskeletal: No deformities, no cyanosis or clubbing         Assessment & Plan:

## 2017-06-08 NOTE — Assessment & Plan Note (Addendum)
She will continue to use her oxygen nocturnally on an as-needed basis during the daytime  She will revisit in 1 week to assess whether this plan is working or not. If persistent infiltrates then will need to reimage with CT chest

## 2017-06-08 NOTE — Assessment & Plan Note (Signed)
This exacerbation is been ongoing since from 04/22/2017.  Seems like she has never gotten a stronger antibiotic or longer and good dose of prednisone.  Current chest x-ray also seems to suggest bibasilar pneumonia  Prednisone 10 mg tabs Take 4 tabs  daily with food x 5 days, then 3 tabs daily x 5 days, then 2 tabs daily x 5 days, then 1 tab daily x 5days then stop. #50  Levaquin 500 mg daily for 5 days. Faythe Ghee to use albuterol nebs every 6 hours as needed for wheezing. Check sputum culture today

## 2017-06-09 ENCOUNTER — Other Ambulatory Visit: Payer: Medicare Other

## 2017-06-09 DIAGNOSIS — J441 Chronic obstructive pulmonary disease with (acute) exacerbation: Secondary | ICD-10-CM | POA: Diagnosis not present

## 2017-06-12 LAB — RESPIRATORY CULTURE OR RESPIRATORY AND SPUTUM CULTURE
MICRO NUMBER:: 90555549
RESULT:: NORMAL
SPECIMEN QUALITY:: ADEQUATE

## 2017-06-15 ENCOUNTER — Ambulatory Visit (INDEPENDENT_AMBULATORY_CARE_PROVIDER_SITE_OTHER)
Admission: RE | Admit: 2017-06-15 | Discharge: 2017-06-15 | Disposition: A | Discharge status: Home / Self Care | Payer: Medicare Other | Source: Ambulatory Visit | Attending: Adult Health | Admitting: Adult Health

## 2017-06-15 ENCOUNTER — Ambulatory Visit (INDEPENDENT_AMBULATORY_CARE_PROVIDER_SITE_OTHER): Payer: Medicare Other | Admitting: Adult Health

## 2017-06-15 ENCOUNTER — Encounter: Payer: Self-pay | Admitting: Adult Health

## 2017-06-15 VITALS — BP 140/86 | HR 63 | Ht <= 58 in | Wt 143.0 lb

## 2017-06-15 DIAGNOSIS — J181 Lobar pneumonia, unspecified organism: Secondary | ICD-10-CM

## 2017-06-15 DIAGNOSIS — J9611 Chronic respiratory failure with hypoxia: Secondary | ICD-10-CM | POA: Diagnosis not present

## 2017-06-15 DIAGNOSIS — R0602 Shortness of breath: Secondary | ICD-10-CM | POA: Diagnosis not present

## 2017-06-15 DIAGNOSIS — R05 Cough: Secondary | ICD-10-CM | POA: Diagnosis not present

## 2017-06-15 DIAGNOSIS — J449 Chronic obstructive pulmonary disease, unspecified: Secondary | ICD-10-CM | POA: Diagnosis not present

## 2017-06-15 DIAGNOSIS — J302 Other seasonal allergic rhinitis: Secondary | ICD-10-CM

## 2017-06-15 NOTE — Progress Notes (Signed)
Reviewed and agree with assessment/plan.   Alistair Senft, MD [facility] 01/30/2016, 12:24 PM Pager:  [phone]  

## 2017-06-15 NOTE — Assessment & Plan Note (Signed)
Continue on current regimen with saline nasal rinses and Astelin. Tinea on Claritin daily

## 2017-06-15 NOTE — Assessment & Plan Note (Signed)
Continue on nocturnal oxygen at 2 L 

## 2017-06-15 NOTE — Progress Notes (Signed)
@Patient  ID: Priscilla Cantrell, female    DOB: 1936-01-22, 82 y.o.   MRN: [id number]  Chief Complaint  Patient presents with  . Follow-up    COPD     Referring provider: [name], MD  HPI: 82 year old female former smoker followed for COPD and chronic hypoxic respiratory failure on nocturnal oxygen  Significant tests/ events reviewed  Spirometry 01/10/09>>FEV1 0.97(52%), FEV1% 47  06/15/2017 Follow up : COPD Exacerbation  Patient presents for a one-week follow-up.  Patient was seen last visit for a slow to resolve COPD exacerbation in the spring and ongoing since February with multiple courses of antibiotics including Z-Pak, doxycycline x2 and prednisone taper.  Chest x-ray second 2019 showed COPD with bibasilar airspace disease.  She was treated with Levaquin and a prednisone taper. She remains on Advair, Spiriva and Daliresp. She says she is feeling better her cough has decreased.  She continues to have sinus drainage despite using Zyrtec, saline nasal rinses and Astelin.  She is taking Mucinex. She denies any hemoptysis, chest pain, orthopnea, edema. Says her appetite is good.  She is remaining active.  Allergies  Allergen Reactions  . Losartan Other (See Comments)    hyperkalemia  . Penicillins Hives, Swelling and Rash    Has patient had a PCN reaction causing immediate rash, facial/tongue/throat swelling, SOB or lightheadedness with hypotension: YES Has patient had a PCN reaction causing severe rash involving mucus membranes or skin necrosis: NO Has patient had a PCN reaction that required hospitalization NO Has patient had a PCN reaction occurring within the last 10 years: NO If all of the above answers are "NO", then may proceed with Cephalosporin use.   . Cefixime [Kdc:Cefixime] Other (See Comments)    unspecified  . Indocin [Indomethacin] Other (See Comments)    Painful tongue and throat  . Sulfa Antibiotics Other (See Comments)    unspecified  . Ace  Inhibitors Other (See Comments)    cough  . Statins Other (See Comments)    Myalgias     Immunization History  Administered Date(s) Administered  . Influenza Split 11/04/2010, 10/23/2011  . Influenza Whole 10/04/2009  . Influenza, High Dose Seasonal PF 11/07/2016  . Influenza,inj,Quad PF,6+ Mos 10/07/2012, 10/11/2014  . Influenza-Unspecified 11/07/2013, 11/01/2015  . Pneumococcal Conjugate-13 04/29/2013  . Pneumococcal Polysaccharide-23 02/03/2005, 11/28/2010  . Td 02/04/2008, 03/07/2010  . Zoster 02/10/2013  . Zoster Recombinat (Shingrix) 10/30/2016, 12/29/2016    Past Medical History:  Diagnosis Date  . Asthma   . Chest pain, non-cardiac    Cardiac CT showed no signif obstructive dz, mildly elevated calcium level (Dr. [name]).  . Chronic cystitis with hematuria    [name]  . Chronic hypoxemic respiratory failure ([place]) 02/02/2015   2L oxygen 24/7  . COPD (chronic obstructive pulmonary disease) ([place])    spirometry 01/10/09 FEV 0.97(52%), FEV1% 47  . DDD (degenerative disc disease), cervical    1998 MRI C5-6 impingemt (left)  . DDD (degenerative disc disease), lumbar   . Depression with anxiety 04/15/2011  . Diarrheal disease Summer 2017   ? 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).  As of 10/05/15 pt reports GI dx'd her with C diff and rx'd more flagyl and she is also on cholestyramine.  As of 02/2016, pt's sx's resolved completely.  . Diverticulosis of colon    Procto '97 and colonoscopy 2002  . Dysplastic nevus of upper extremity 04/2014  R tricep ([name])  . Family history of adverse reaction to anesthesia    mother died when patient age 40 receiving anesthesia for thyroid surgery  . Fibrocystic breast disease    w/fibroadenoma.  Bx's showed NO atypia (Dr. Margot Chimes).  Mammo neg 2008.  Marland Kitchen GERD (gastroesophageal reflux disease)   . History of double  vision    Ophthalmologist, Dr. Herbert Deaner, is further evaluating this with MRI  orbits and limited brain (myesthenia gravis testing neg 07/2010)  . History of home oxygen therapy    uses 2 liters at hs  . Hypertension    EKG 03/2010 normal  . Hypothyroidism    Hashimoto's, dx'd 1989  . Hypoxemia    O2 86% RA with exertion 01/10/09, 2 liters oxygen with exertion and sleep  . Lesion of right native kidney 2017   found on CT 02/2015.  F/u MRI 03/31/15 showed it to be smaller and less likely of any significance but repeat CT renal protocol in 35mo was recommended by radiology.  This was done 10/26/15 and showed no interval change in the lesion, but b/c the lesion enhances with contrast, radiol suggested urol consultation (b/c papillary renal cell ca could not be excluded).  Saw urol 03/06/16--stable on u/s 09/2016  . Osteoarthritis, hip, bilateral    End stage; R THA  . Postmenopausal atrophic vaginitis    improved with estrace 2 X/week.  . Pulmonary nodule    CT chest 12/10, June 2011.  No nodule on CT 06/2010.  Marland Kitchen Recurrent Clostridium difficile diarrhea    Episode 09/19/2016: resolved with prolonged course of flagyl.  11/2016 recurrence treated with 10d of Dificid (Fidaxomicin) by GI (Dr. Earlean Shawl).  . Recurrent UTI    likely related to pt's atrophic vaginitis.  Urol started vaginal estrace 03/2016.  Marland Kitchen Shingles 02/2014   R abd/flank  . 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 Upper airway cough syndrome     Tobacco History: Social History   Tobacco Use  Smoking Status Former Smoker  . Packs/day: 2.00  . Years: 35.00  . Pack years: 70.00  . Types: Cigarettes  . Last attempt to quit: 02/03/1990  . Years since quitting: 27.3  Smokeless Tobacco Never Used   Counseling given: Not Answered   Outpatient Encounter Medications as of 06/15/2017  Medication Sig  . acetaminophen (TYLENOL) 500 MG tablet Take 500 mg by mouth every 6 (six) hours as  needed for mild pain, moderate pain, fever or headache.  . albuterol (PROVENTIL HFA;VENTOLIN HFA) 108 (90 Base) MCG/ACT inhaler INHALE 2 PUFFS BY MOUTH EVERY 4 HOURS AS NEEDED  . albuterol (PROVENTIL) (2.5 MG/3ML) 0.083% nebulizer solution INHALE 1 VIAL VIA NEBULIZER EVERY 6 HOURS AS NEEDED FOR WHEEZING OR SHORTNESS OF BREATH  . amLODipine (NORVASC) 5 MG tablet Take 1 tablet (5 mg total) by mouth daily.  Marland Kitchen aspirin EC 81 MG tablet Take 81 mg by mouth daily.  Marland Kitchen azelastine (ASTELIN) 0.1 % nasal spray Place 2 sprays into both nostrils 2 (two) times daily. Use in each nostril as directed  . cetirizine (ZYRTEC) 10 MG tablet Take 1 tablet (10 mg total) by mouth daily. (Patient taking differently: Take 10-20 mg by mouth daily as needed for allergies. )  . clindamycin (CLEOCIN) 300 MG capsule 2 tabs po x 1 dose (1 hour prior to dental procedure)  . diazepam (VALIUM) 5 MG tablet TAKE 1 TABLET BY MOUTH EVERY MORNING AND 2 TABLETS  EVERY NIGHT AT BEDTIME  . estradiol (ESTRACE) 0.1 MG/GM vaginal cream APPLY 1/2 INCH RIBBON VAGINALLY QHS FOR 14 DAYS THEN TWICE WEEKLY  . fluticasone (FLONASE) 50 MCG/ACT nasal spray Place 1 spray into both nostrils daily.  . hyoscyamine (LEVSIN SL) 0.125 MG SL tablet 1-2 tabs po q6h prn lower abd pain and urgent loose stools  . Lactobacillus (PROBIOTIC ACIDOPHILUS) CAPS Take 2 capsules by mouth daily.  Marland Kitchen levofloxacin (LEVAQUIN) 500 MG tablet Take 1 tablet (500 mg total) by mouth daily.  Marland Kitchen Lifitegrast (XIIDRA) 5 % SOLN Place 1 drop into both eyes 2 (two) times daily.  . predniSONE (DELTASONE) 10 MG tablet Take 40mg x5days,30mg x5days,20mg x5days,10mg x5days,then stop  . Probiotic Product (PROBIOTIC DAILY PO) Take 2 capsules by mouth daily.   . roflumilast (DALIRESP) 500 MCG TABS tablet Take 1 tablet (500 mcg total) by mouth daily.  Marland Kitchen SPIRIVA HANDIHALER 18 MCG inhalation capsule INHALE 1 CAPSULE VIA HANDIHALER EVERY DAY AT THE SAME TIME  . SYNTHROID 88 MCG tablet TAKE 1 TABLET BY MOUTH  DAILY  . Tetrahydrozoline HCl (EYE DROPS OP) Apply to eye. SootheXP  . triamcinolone cream (KENALOG) 0.1 % Apply 1 application topically 2 (two) times daily.  Marland Kitchen venlafaxine (EFFEXOR) 50 MG tablet TAKE 1 TABLET(50 MG) BY MOUTH TWICE DAILY  . WIXELA INHUB 250-50 MCG/DOSE AEPB INHALE 1 PUFF INTO THE LUNGS TWICE DAILY  . [DISCONTINUED] predniSONE (DELTASONE) 10 MG tablet 2 tabs po qd x 3d, then 1 tab po qd x 3d   No facility-administered encounter medications on file as of 06/15/2017.      Review of Systems  Constitutional:   No  weight loss, night sweats,  Fevers, chills, fatigue, or  lassitude.  HEENT:   No headaches,  Difficulty swallowing,  Tooth/dental problems, or  Sore throat,                No sneezing, itching, ear ache, + nasal congestion, post nasal drip,   CV:  No chest pain,  Orthopnea, PND, swelling in lower extremities, anasarca, dizziness, palpitations, syncope.   GI  No heartburn, indigestion, abdominal pain, nausea, vomiting, diarrhea, change in bowel habits, loss of appetite, bloody stools.   Resp:   No chest wall deformity  Skin: no rash or lesions.  GU: no dysuria, change in color of urine, no urgency or frequency.  No flank pain, no hematuria   MS:  No joint pain or swelling.  No decreased range of motion.  No back pain.    Physical Exam  BP 140/86 (BP Location: Left Arm, Cuff Size: Normal)   Pulse 63   Ht 4' 9.5" (1.461 m)   Wt 143 lb (64.9 kg)   SpO2 97%   BMI 30.41 kg/m   GEN: A/Ox3; pleasant , NAD, elderly   HEENT:  [place]/AT,  EACs-clear, TMs-wnl, NOSE-clear drainage, THROAT-clear, no lesions, no postnasal drip or exudate noted.   NECK:  Supple w/ fair ROM; no JVD; normal carotid impulses w/o bruits; no thyromegaly or nodules palpated; no lymphadenopathy.    RESP  Clear  P & A; w/o, wheezes/ rales/ or rhonchi. no accessory muscle use, no dullness to percussion  CARD:  RRR, no m/r/g, no peripheral edema, pulses intact, no cyanosis or  clubbing.  GI:   Soft & nt; nml bowel sounds; no organomegaly or masses detected.   Musco: Warm bil, no deformities or joint swelling noted.   Neuro: alert, no focal deficits noted.    Skin: Warm, no lesions or rashes  Lab Results:  CBC    Component Value Date/Time   WBC 10.3 05/14/2017 1215   RBC 4.70 05/14/2017 1215   HGB 13.4 05/14/2017 1215   HCT 40.4 05/14/2017 1215   PLT 370.0 05/14/2017 1215   MCV 86.0 05/14/2017 1215   MCH 27.7 07/01/2015 1730   MCHC 33.0 05/14/2017 1215   RDW 14.7 05/14/2017 1215   LYMPHSABS 1.7 05/14/2017 1215   MONOABS 0.9 05/14/2017 1215   EOSABS 0.2 05/14/2017 1215   BASOSABS 0.1 05/14/2017 1215    BMET    Component Value Date/Time   NA 141 09/29/2016 1013   K 4.0 09/29/2016 1013   CL 106 09/29/2016 1013   CO2 29 09/29/2016 1013   GLUCOSE 86 09/29/2016 1013   BUN 14 09/29/2016 1013   CREATININE 0.72 09/29/2016 1013   CREATININE 0.84 02/27/2016 1533   CALCIUM 9.5 09/29/2016 1013   GFRNONAA >60 07/01/2015 1730   GFRAA >60 07/01/2015 1730    BNP    Component Value Date/Time   BNP 78.4 01/24/2015 1217    ProBNP No results found for: PROBNP  Imaging: Dg Chest 2 View  Result Date: 06/04/2017 CLINICAL DATA:  COPD exacerbation with cough EXAM: CHEST - 2 VIEW COMPARISON:  05/11/2017 FINDINGS: COPD with pulmonary hyperinflation. Streaky lung markings in the bases are more prominent than on the prior study and could represent pneumonia or atelectasis. Heart size upper normal.  Negative for heart failure or effusion. IMPRESSION: COPD with mild bibasilar airspace disease. Electronically Signed   By: Franchot Gallo M.D.   On: 06/04/2017 07:59     Assessment & Plan:   COPD with chronic bronchitis Slow to resolve COPD exacerbation with chronic rhinitis plus or minus pneumonia Seems to be clinically improving with recent course of antibiotics and steroids. Patient is to finish her antibiotics and prednisone as discussed. Chest x-ray  today. On her current aggressive regimen with Spiriva Advair and [place]  Patient Instructions  Finish Levaquin as directed.  Mucinex twice daily  As needed  Cough/congestion.  Continue on Advair,  Spiriva and Daliresp May use albuterol nebulizer as needed for wheezing and congestion. Saline nasal spray as needed. Continue on Zyrtec daily Continue on Astelin nasal spray Continue on oxygen 2l/m At bedtime  .  Follow-up with [name]  in 2-3 months and As needed   Please contact office for sooner follow up if symptoms do not improve or worsen or seek emergency care       Chronic hypoxemic respiratory failure ([place]) Continue on nocturnal oxygen at 2 L  Allergic rhinitis Continue on current regimen with saline nasal rinses and Astelin. Tinea on Claritin daily     Rexene Edison, NP 06/15/2017

## 2017-06-15 NOTE — Assessment & Plan Note (Signed)
Slow to resolve COPD exacerbation with chronic rhinitis plus or minus pneumonia Seems to be clinically improving with recent course of antibiotics and steroids. Patient is to finish her antibiotics and prednisone as discussed. Chest x-ray today. On her current aggressive regimen with Spiriva Advair and [place]  Patient Instructions  Finish Levaquin as directed.  Mucinex twice daily  As needed  Cough/congestion.  Continue on Advair,  Spiriva and Daliresp May use albuterol nebulizer as needed for wheezing and congestion. Saline nasal spray as needed. Continue on Zyrtec daily Continue on Astelin nasal spray Continue on oxygen 2l/m At bedtime  .  Follow-up with [name]  in 2-3 months and As needed   Please contact office for sooner follow up if symptoms do not improve or worsen or seek emergency care

## 2017-06-15 NOTE — Patient Instructions (Signed)
Finish Levaquin as directed.  Mucinex twice daily  As needed  Cough/congestion.  Continue on Advair,  Spiriva and Daliresp May use albuterol nebulizer as needed for wheezing and congestion. Saline nasal spray as needed. Continue on Zyrtec daily Continue on Astelin nasal spray Continue on oxygen 2l/m At bedtime  .  Follow-up with [name]  in 2-3 months and As needed   Please contact office for sooner follow up if symptoms do not improve or worsen or seek emergency care

## 2017-06-20 IMAGING — CT CT ABD-PELV W/ CM
2 of 5 series · 16 of 46 positions shown, 18 images · IV contrast (APPLIED)
Comparison: CT of the abdomen and pelvis performed 02/19/2015, and
MRA of the abdomen performed 03/31/2015

CLINICAL DATA: Acute onset of lower abdominal pain and diarrhea.
Leukocytosis. Low-grade fever. Initial encounter.

EXAM:
CT ABDOMEN AND PELVIS WITH CONTRAST
TECHNIQUE: Multidetector CT imaging of the abdomen and pelvis was performed
using the standard protocol following bolus administration of
intravenous contrast.
CONTRAST:  100mL 188V86-GPP IOPAMIDOL (188V86-GPP) INJECTION 61%

[Series 2: axial st · axial · 0.83mm/px · z∈[-422,-57]mm · 13 of 83 slices shown, 15 images]
[im 5/83  soft-tissue]
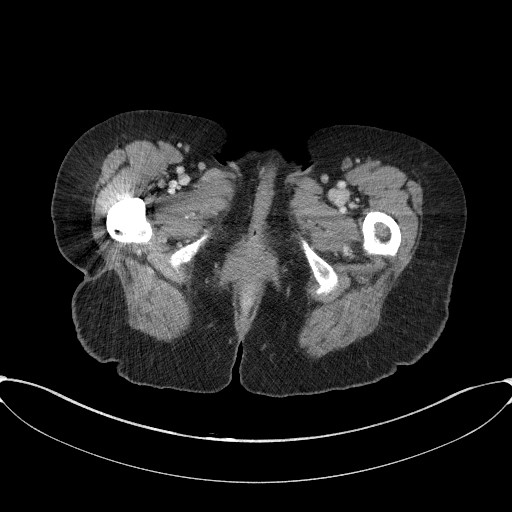
[im 5/83  bone]
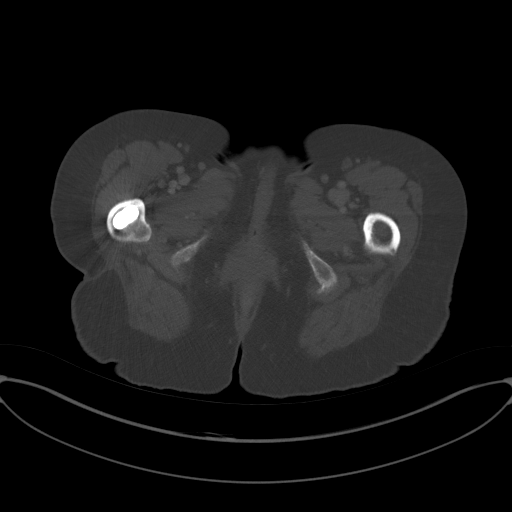
[im 13/83  soft-tissue]
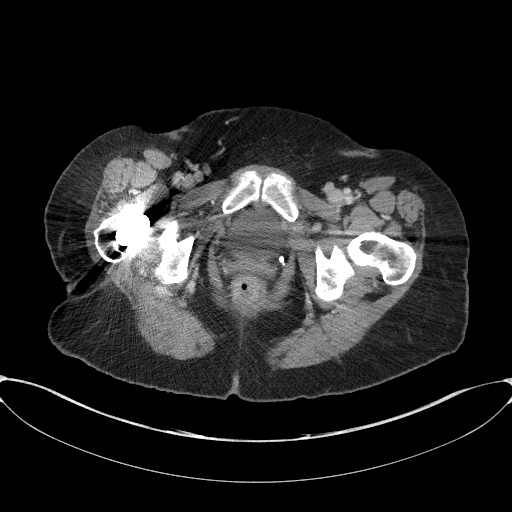
[im 18/83  soft-tissue]
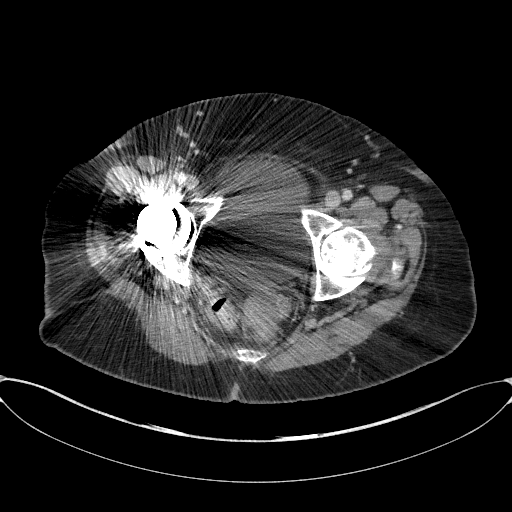
[im 22/83  soft-tissue]
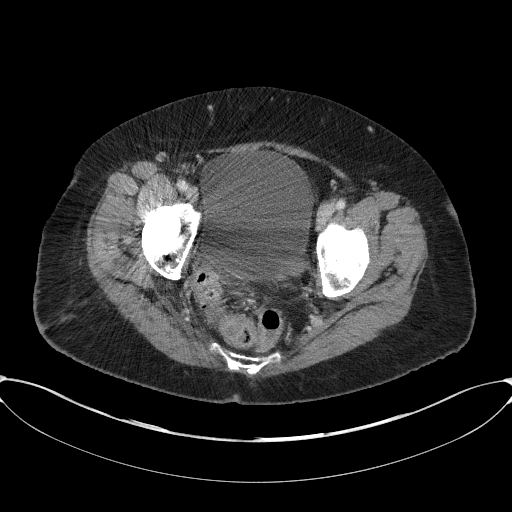
[im 31/83  soft-tissue]
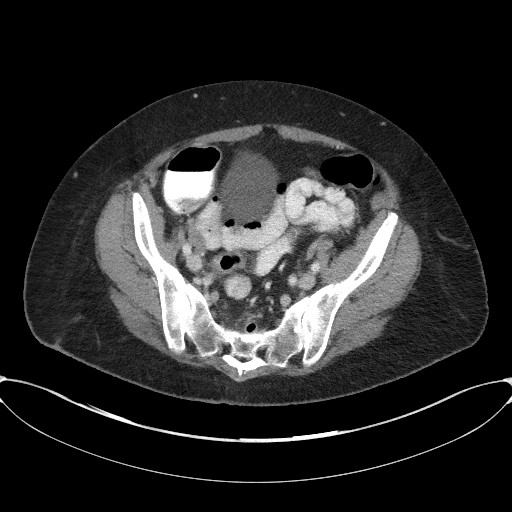
[im 35/83  soft-tissue]
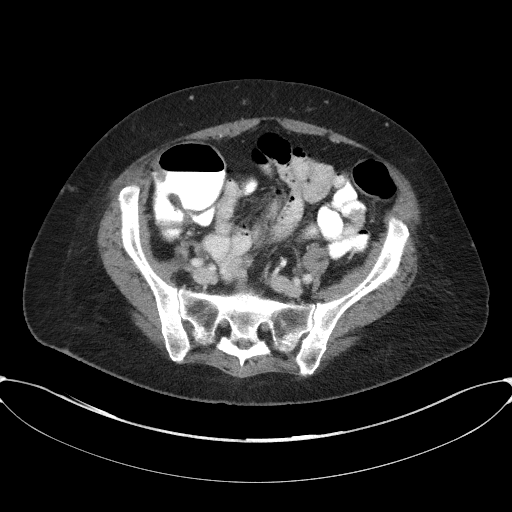
[im 44/83  soft-tissue]
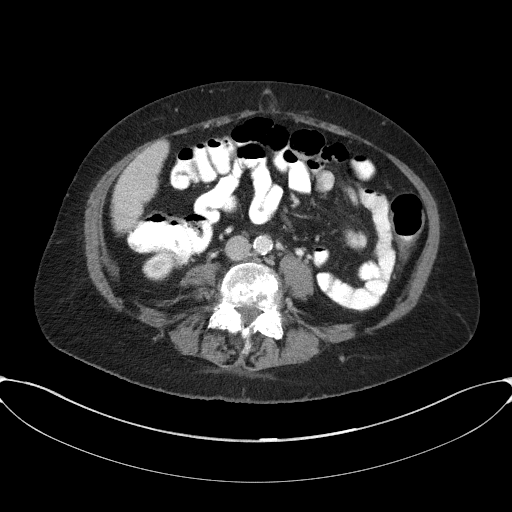
[im 48/83  soft-tissue]
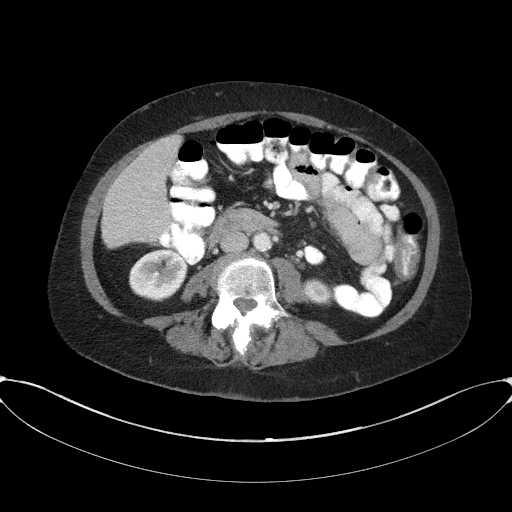
[im 52/83  soft-tissue]
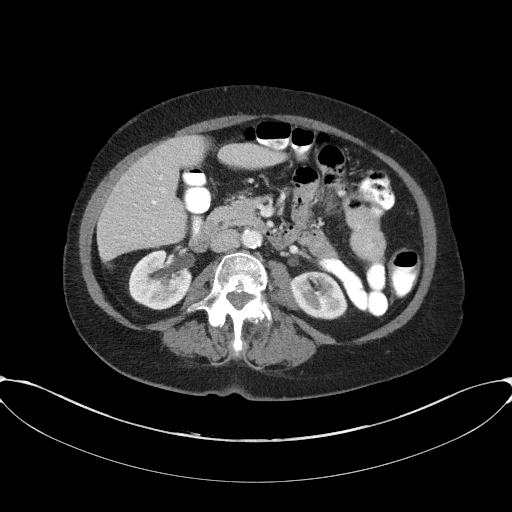
[im 52/83  bone]
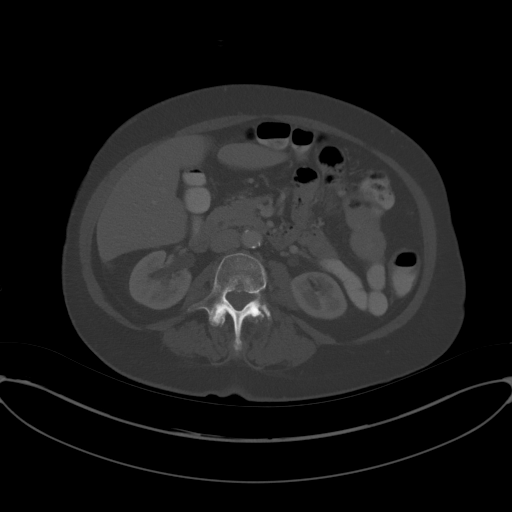
[im 61/83  soft-tissue]
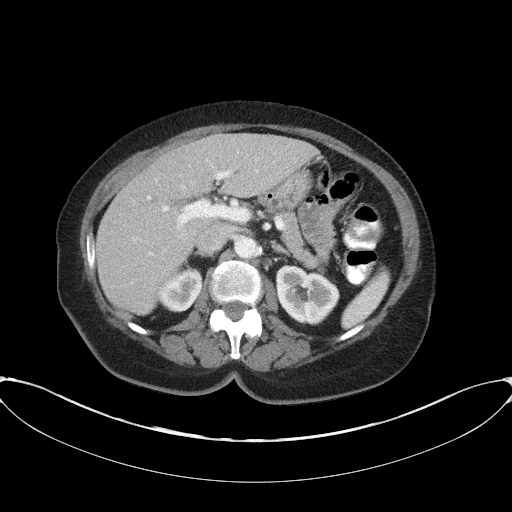
[im 65/83  soft-tissue]
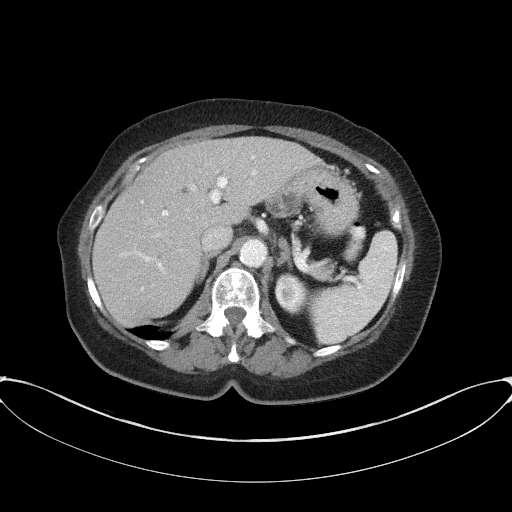
[im 70/83  soft-tissue]
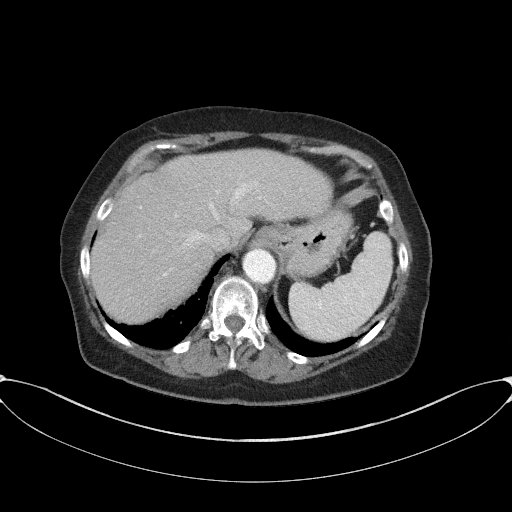
[im 78/83  soft-tissue]
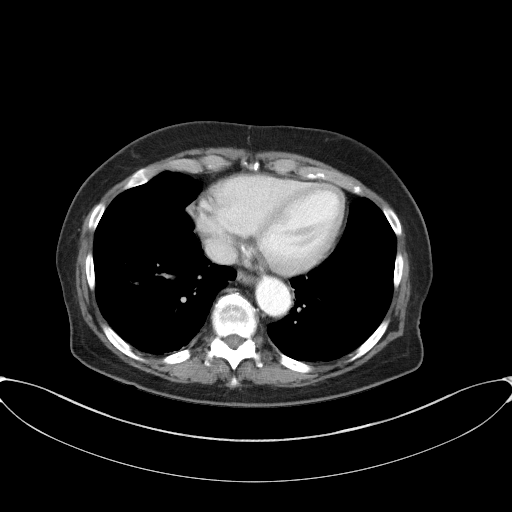

[Series 5: coronal st · coronal · 0.86mm/px · 3 of 87 slices shown]
[im 29/87  soft-tissue]
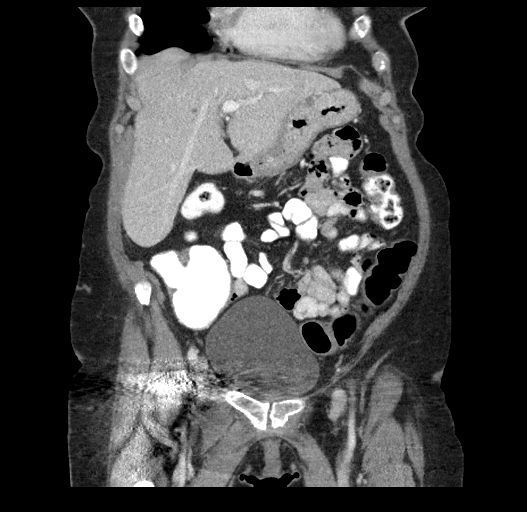
[im 39/87  soft-tissue]
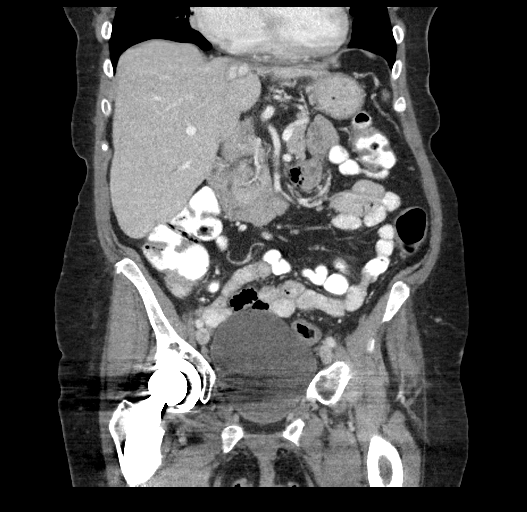
[im 48/87  soft-tissue]
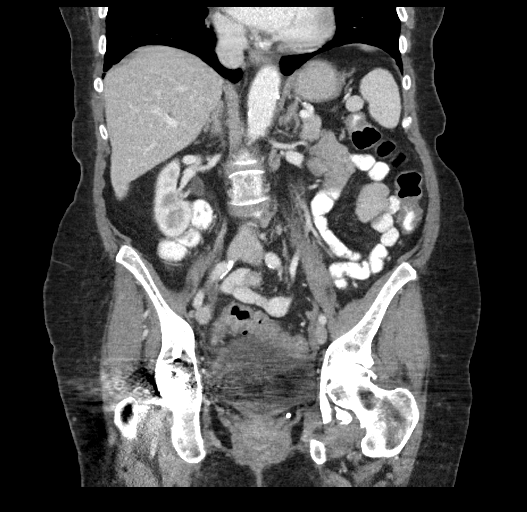

[16 of 46 positions shown; findings below may reference images not displayed]

FINDINGS: Minimal bibasilar atelectasis or scarring is noted.

Relatively stable hypodensities within the liver measure up to 9 mm
in size. The liver and spleen are otherwise unremarkable. The
patient is status post cholecystectomy, with clips noted at the
gallbladder fossa. The pancreas and adrenal glands are unremarkable.

The kidneys are unremarkable in appearance. There is no evidence of
hydronephrosis. No renal or ureteral stones are seen. No perinephric
stranding is appreciated.

No free fluid is identified. The small bowel is unremarkable in
appearance. The stomach is within normal limits. No acute vascular
abnormalities are seen. Mild calcification is seen along the
abdominal aorta and its branches.

The patient is status post appendectomy. Contrast progresses to the
level of the descending colon. There is mild wall thickening along
the sigmoid colon, which could reflect a mild infectious or
inflammatory process.

The bladder is moderately distended. A tiny air bubble is noted
within the bladder. Would correlate for recent Foley catheter
placement. The patient is status post hysterectomy. No suspicious
adnexal masses are seen. No inguinal lymphadenopathy is seen.

No acute osseous abnormalities are identified. The patient's right
hip total arthroplasty is grossly unremarkable in appearance. There
is chronic osseous fusion at L4-L5, with underlying facet disease.
IMPRESSION: 1. Mild wall thickening along the sigmoid colon could reflect a mild
infectious or inflammatory process.
2. Tiny air bubble noted within the bladder. Would correlate for
recent Foley catheter placement.
3. Small nonspecific hypodensities within the liver measure up to 9
mm in size, relatively stable in appearance.
4. Mild calcification along the abdominal aorta and its branches.

## 2017-07-08 ENCOUNTER — Encounter: Payer: Self-pay | Admitting: Pulmonary Disease

## 2017-07-08 ENCOUNTER — Ambulatory Visit (INDEPENDENT_AMBULATORY_CARE_PROVIDER_SITE_OTHER): Payer: Medicare Other | Admitting: Pulmonary Disease

## 2017-07-08 VITALS — BP 130/78 | HR 81 | Ht <= 58 in | Wt 144.0 lb

## 2017-07-08 DIAGNOSIS — J449 Chronic obstructive pulmonary disease, unspecified: Secondary | ICD-10-CM | POA: Diagnosis not present

## 2017-07-08 DIAGNOSIS — J9611 Chronic respiratory failure with hypoxia: Secondary | ICD-10-CM | POA: Diagnosis not present

## 2017-07-08 DIAGNOSIS — R05 Cough: Secondary | ICD-10-CM

## 2017-07-08 DIAGNOSIS — J302 Other seasonal allergic rhinitis: Secondary | ICD-10-CM | POA: Diagnosis not present

## 2017-07-08 DIAGNOSIS — R058 Other specified cough: Secondary | ICD-10-CM

## 2017-07-08 NOTE — Patient Instructions (Signed)
Try using astelin, flonase, and nasal saline spray daily until sinus symptoms better Try using spoon of local honey daily  Follow up in 4 months

## 2017-07-08 NOTE — Progress Notes (Signed)
[facility]  Chief Complaint  Patient presents with  . Follow-up    Pt has productive cough- not sure of color, and SOB with exertion.    Vital signs: BP 130/78 (BP Location: Left Arm, Cuff Size: Normal)   Pulse 81   Ht 4\' 10"  (1.473 m)   Wt 144 lb (65.3 kg)   SpO2 94%   BMI 30.10 kg/m   History of Present Illness: Priscilla Cantrell is a 82 y.o. female former smoker with GOLD 4 COPD, ACE cough, and nocturnal hypoxemia.  She has been on multiple rounds of ABx and prednisone over the past several months.  There was concern for COPD exacerbation and possible pneumonia.  She feels most of her symptoms have been related to her sinuses and allergies.    She is getting sinus congestion, post nasal drip, and this triggers a cough.  She coughs up clear sputum.  Not having fever, sweats, hemoptysis, chest pain, leg swelling, abdominal pain.  Not needing to use albuterol much.  She hasn't been using astelin, flonase, or nasal irrigation on regular basis.  CBC and RAST from 05/14/17 were normal.  CXR from 06/15/17 showed changes of COPD.     Physical Exam:  General - pleasant Eyes - pupils reactive ENT - no sinus tenderness, no oral exudate, no LAN Cardiac - regular, no murmur Chest - no wheeze, rales Abd - soft, non tender Ext - no edema Skin - no rashes Neuro - normal strength Psych - normal mood  Assessment/Plan:  Allergic rhinitis with upper airway cough. - advised her to use astelin, flonase, nasal irrigation on regular basis until her symptoms are improved - continue zyrtec  GOLD 4 COPD with chronic bronchitis. - continue spiriva, wixela, daliresp - prn albuterol  Chronic respiratory failure with hypoxia. - 2 liters oxygen at night and prn with exertion   Patient Instructions  Try using astelin, flonase, and nasal saline spray daily until sinus symptoms better Try using spoon of local honey daily  Follow up in 4  months    Chesley Mires, MD [place] 07/08/2017, 11:00 AM Pager:  [phone]  Flow Sheet  Pulmonary tests Spirometry 01/10/09>>FEV1 0.97(52%), FEV1% 47 March 2012>>Daliresp started RAST 05/04/17 >> negative, IgE 8 Spirometry 05/14/17 >> FEV1 0.91 (55%), FEV1% 48  Sleep tests PSG 11/14/11 >> AHI 0.3 ONO with RA 07/31/16 >> test time 6 hrs 41 min.  Average SpO2 87%, low SpO2 74%.  Spent 4 hrs 13 min with SpO2 < 88%.  Cardiac tests Echo 08/30/14 >> EF 60 to 61%, grade 1 diastolic dysfx, PAS 24 mmHG  Past Medical History: She  has a past medical history of Asthma, Chest pain, non-cardiac, Chronic cystitis with hematuria, Chronic hypoxemic respiratory failure ([place]) (02/02/2015), COPD (chronic obstructive pulmonary disease) ([place]), DDD (degenerative disc disease), cervical, DDD (degenerative disc disease), lumbar, Depression with anxiety (04/15/2011), Diarrheal disease (Summer 2017), Diverticulosis of colon, Dysplastic nevus of upper extremity (04/2014), Family history of adverse reaction to anesthesia, Fibrocystic breast disease, GERD (gastroesophageal reflux disease), History of double vision, History of home oxygen therapy, Hypertension, Hypothyroidism, Hypoxemia, Lesion of right native kidney (2017), Osteoarthritis, hip, bilateral, Postmenopausal atrophic vaginitis, Pulmonary nodule, Recurrent Clostridium difficile diarrhea, Recurrent UTI, Shingles (02/2014), Spinal stenosis of lumbar region with neurogenic claudication, and Upper airway cough syndrome.  Past Surgical History: She  has a past surgical history that includes Cholecystectomy (2001); Tubal ligation (1974); Abdominal hysterectomy (1978); Breast cyst excision; Appendectomy; Tonsillectomy; Colonoscopy (04/2005;  12/06/2015); transthoracic echocardiogram (08/2014); Total hip arthroplasty (Right, 11/01/2014); Hip Closed Reduction (Right, 12/25/2015); and DEXA (05/18/2017).  Family History: Her family history includes  Cancer in her paternal aunt and paternal grandfather; Cirrhosis in her father; Diabetes in her daughter; Goiter in her mother; Hernia in her paternal grandmother; Hypertension in her paternal grandmother; Psoriasis in her father; Stroke in her maternal grandfather and paternal grandmother.  Social History: She  reports that she quit smoking about 27 years ago. Her smoking use included cigarettes. She has a 70.00 pack-year smoking history. She has never used smokeless tobacco. She reports that she drinks alcohol. She reports that she does not use drugs.  Medications: Allergies as of 07/08/2017      Reactions   Losartan Other (See Comments)   hyperkalemia   Penicillins Hives, Swelling, Rash   Has patient had a PCN reaction causing immediate rash, facial/tongue/throat swelling, SOB or lightheadedness with hypotension: YES Has patient had a PCN reaction causing severe rash involving mucus membranes or skin necrosis: NO Has patient had a PCN reaction that required hospitalization NO Has patient had a PCN reaction occurring within the last 10 years: NO If all of the above answers are "NO", then may proceed with Cephalosporin use.   Cefixime [kdc:cefixime] Other (See Comments)   unspecified   Indocin [indomethacin] Other (See Comments)   Painful tongue and throat   Sulfa Antibiotics Other (See Comments)   unspecified   Ace Inhibitors Other (See Comments)   cough   Statins Other (See Comments)   Myalgias      Medication List        Accurate as of 07/08/17 11:00 AM. Always use your most recent med list.          acetaminophen 500 MG tablet Commonly known as:  TYLENOL Take 500 mg by mouth every 6 (six) hours as needed for mild pain, moderate pain, fever or headache.   albuterol 108 (90 Base) MCG/ACT inhaler Commonly known as:  PROVENTIL HFA;VENTOLIN HFA INHALE 2 PUFFS BY MOUTH EVERY 4 HOURS AS NEEDED   albuterol (2.5 MG/3ML) 0.083% nebulizer solution Commonly known as:   PROVENTIL INHALE 1 VIAL VIA NEBULIZER EVERY 6 HOURS AS NEEDED FOR WHEEZING OR SHORTNESS OF BREATH   amLODipine 5 MG tablet Commonly known as:  NORVASC Take 1 tablet (5 mg total) by mouth daily.   aspirin EC 81 MG tablet Take 81 mg by mouth daily.   azelastine 0.1 % nasal spray Commonly known as:  ASTELIN Place 2 sprays into both nostrils 2 (two) times daily. Use in each nostril as directed   Biotin 10 MG Caps Take by mouth.   cetirizine 10 MG tablet Commonly known as:  ZYRTEC Take 1 tablet (10 mg total) by mouth daily.   clindamycin 300 MG capsule Commonly known as:  CLEOCIN 2 tabs po x 1 dose (1 hour prior to dental procedure)   diazepam 5 MG tablet Commonly known as:  VALIUM TAKE 1 TABLET BY MOUTH EVERY MORNING AND 2 TABLETS EVERY NIGHT AT BEDTIME   estradiol 0.1 MG/GM vaginal cream Commonly known as:  ESTRACE APPLY 1/2 INCH RIBBON VAGINALLY QHS FOR 14 DAYS THEN TWICE WEEKLY   EYE DROPS OP Apply to eye. SootheXP   Fish Oil 1000 MG Cpdr Take by mouth.   fluticasone 50 MCG/ACT nasal spray Commonly known as:  FLONASE Place 1 spray into both nostrils daily.   hyoscyamine 0.125 MG SL tablet Commonly known as:  LEVSIN SL 1-2 tabs po q6h  prn lower abd pain and urgent loose stools   PROBIOTIC ACIDOPHILUS Caps Take 2 capsules by mouth daily.   PROBIOTIC DAILY PO Take 2 capsules by mouth daily.   roflumilast 500 MCG Tabs tablet Commonly known as:  DALIRESP Take 1 tablet (500 mcg total) by mouth daily.   SPIRIVA HANDIHALER 18 MCG inhalation capsule Generic drug:  tiotropium INHALE 1 CAPSULE VIA HANDIHALER EVERY DAY AT THE SAME TIME   SYNTHROID 88 MCG tablet Generic drug:  levothyroxine TAKE 1 TABLET BY MOUTH DAILY   triamcinolone cream 0.1 % Commonly known as:  KENALOG Apply 1 application topically 2 (two) times daily.   venlafaxine 50 MG tablet Commonly known as:  EFFEXOR TAKE 1 TABLET(50 MG) BY MOUTH TWICE DAILY   WIXELA INHUB 250-50 MCG/DOSE  Aepb Generic drug:  Fluticasone-Salmeterol INHALE 1 PUFF INTO THE LUNGS TWICE DAILY   XIIDRA 5 % Soln Generic drug:  Lifitegrast Place 1 drop into both eyes 2 (two) times daily.

## 2017-07-09 ENCOUNTER — Encounter: Payer: Self-pay | Admitting: Family Medicine

## 2017-07-27 DIAGNOSIS — H00022 Hordeolum internum right lower eyelid: Secondary | ICD-10-CM | POA: Diagnosis not present

## 2017-08-12 DIAGNOSIS — M25552 Pain in left hip: Secondary | ICD-10-CM | POA: Diagnosis not present

## 2017-08-12 DIAGNOSIS — M1612 Unilateral primary osteoarthritis, left hip: Secondary | ICD-10-CM | POA: Diagnosis not present

## 2017-09-01 ENCOUNTER — Encounter: Payer: Self-pay | Admitting: Pulmonary Disease

## 2017-09-01 ENCOUNTER — Ambulatory Visit (INDEPENDENT_AMBULATORY_CARE_PROVIDER_SITE_OTHER): Payer: Medicare Other | Admitting: Pulmonary Disease

## 2017-09-01 ENCOUNTER — Ambulatory Visit: Payer: Self-pay | Admitting: Family Medicine

## 2017-09-01 VITALS — BP 116/64 | HR 82 | Ht <= 58 in | Wt 142.0 lb

## 2017-09-01 DIAGNOSIS — J9611 Chronic respiratory failure with hypoxia: Secondary | ICD-10-CM

## 2017-09-01 DIAGNOSIS — J302 Other seasonal allergic rhinitis: Secondary | ICD-10-CM | POA: Diagnosis not present

## 2017-09-01 DIAGNOSIS — R058 Other specified cough: Secondary | ICD-10-CM

## 2017-09-01 DIAGNOSIS — J449 Chronic obstructive pulmonary disease, unspecified: Secondary | ICD-10-CM

## 2017-09-01 DIAGNOSIS — R05 Cough: Secondary | ICD-10-CM | POA: Diagnosis not present

## 2017-09-01 DIAGNOSIS — Z01811 Encounter for preprocedural respiratory examination: Secondary | ICD-10-CM

## 2017-09-01 NOTE — Telephone Encounter (Signed)
Spoke with patient regarding symptoms. Pt reports left ankle swelling up her leg and pain in groin area. Pt denies CP, states SOB has not increased from normal status d/t COPD. Scheduled to see PCP on 09/02/17 for assessment.  FYI--Also schedule for surgical clearance on Friday, 09/04/17, advised to keep both appointments.

## 2017-09-01 NOTE — Telephone Encounter (Signed)
She called in after seeing her pulmonologist this today.   She is for left hip replacement surgery.  It's not scheduled yet because she recently had some dental surgery plus she is being evaluated for surgical clearance.   But she was instructed to call her PCP due to her left foot and ankle being swollen.   See the triage notes below.   She is c/o having numbness on the bottom of her left foot and outer portion of her left ankle.   She noticed the swelling on Saturday.    She had 2 nurses that she goes to church with look at her legs.   One mentioned that her left leg looked a little purple in color.    There is no temperature difference in her legs when asked.   She denies redness or pain except for pain in her left groin area when she moves certain ways.   She thinks this is related to her hip that is needing replacement.    She denies chest pain but has shortness of breath that is unchanged for her.  She has COPD which is why she saw [name] today for surgical clearance.   She mentioned she had pain under her right breast that she took some Tums for with relief.   "I haven't felt good lately".    Not able to expand on this statement when asked other than not feeling good.  I contacted the flow coordinator who is going to have "Maudie Mercury" a clinical person look over the notes and they will contact the pt as to what the course of action is for her.  The pt was agreeable to this plan of someone calling her back from the office.   Reason for Disposition . [1] Thigh, calf, or ankle swelling AND [2] only 1 side  Answer Assessment - Initial Assessment Questions 1. LOCATION: "Which joint is swollen?"     It's swollen from left ankle.   I'm for a hip replacement on my left hip.    My left foot feels numb on the bottom.   I move a certain way I have pain in my groin area.   2. ONSET: "When did the swelling start?"     Saturday.     3. SIZE: "How large is the swelling?"     I can see my ankle bone.   It feels  a little numb too. 4. PAIN: "Is there any pain?" If so, ask: "How bad is it?" (Scale 1-10; or mild, moderate, severe)     Pain is in the groin area when I move a certain way.     It's been there for 2-3 days.   They feel the same as far as temperature.    It has some purple to it per a nurse that looked at it Sunday at church for me.    5. CAUSE: "What do you think caused the swollen joint?"     I figured it is coming from the hip.    6. OTHER SYMPTOMS: "Do you have any other symptoms?" (e.g., fever, chest pain, difficulty breathing, calf pain)     I COPD so I can be short of breath.   I had some pain in my right side behind my right breast.   I took some Tums and it went away.   I think it was what I had eaten.   I have not felt good.    7. PREGNANCY: "Is there any chance  you are pregnant?" "When was your last menstrual period?"     *No Answer*  Protocols used: ANKLE SWELLING-A-AH

## 2017-09-01 NOTE — Patient Instructions (Signed)
Follow up in 4 months 

## 2017-09-01 NOTE — Progress Notes (Signed)
[facility]  Chief Complaint  Patient presents with  . Follow-up    surgical clearance for hip replacement    Constitutional: BP 116/64 (BP Location: Left Arm, Cuff Size: Normal)   Pulse 82   Ht 4\' 9"  (1.448 m)   Wt 142 lb (64.4 kg)   SpO2 96%   BMI 30.73 kg/m   History of Present Illness: Priscilla Cantrell is a 82 y.o. female former smoker with GOLD 4 COPD, ACE cough, and nocturnal hypoxemia.  Her left hip pain has gotten worse.  She is being assessed by Dr. Adriana Mccallum for hip surgery.  She isn't having much cough.  No fever, chest pain, wheeze.  She has post nasal drip and this triggers cough.  Needs to use oxygen more in hot weather.  Better in air conditioning.  She had dental extractions and fitted with partial dentures.   Comprehensive Respiratory Exam:  Appearance - well kempt  ENMT - nasal mucosa moist, turbinates clear, midline nasal septum, partial dentures, no gingival bleeding, no oral exudates, no tonsillar hypertrophy Neck - no masses, trachea midline, no thyromegaly, no elevation in JVP Respiratory - normal appearance of chest wall, normal respiratory effort w/o accessory muscle use, no dullness on percussion, no wheezing or rales CV - s1s2 regular rate and rhythm, no murmurs, no peripheral edema, radial pulses symmetric GI - soft, non tender, no masses, no hepatosplenomegaly Lymph - no adenopathy noted in neck and axillary areas MSK - normal muscle strength and tone, walks with a cane Ext - no cyanosis, clubbing, or joint inflammation noted Skin - no rashes, lesions, or ulcers Neuro - oriented to person, place, and time Psych - normal mood and affect   Assessment/Plan:  Preoperative respiratory assessment. - she is being assessed for left hip surgery - she is at moderate risk for perioperative pulmonary complications based on her COPD and nocturnal hypoxemia - she can proceed with surgery from a pulmonary stand  point - she does not need any additional pulmonary testing at this time prior to her having surgery  Allergic rhinitis with upper airway cough. - most of her current symptoms are related to this - continue astelin, flonase, zyrtec, and nasal irrigation as needed  GOLD 4 COPD with chronic bronchitis. - continue spiriva, wixela, and daliresp - prn albuterol  Chronic respiratory failure with hypoxia. - 2 liters oxygen at night   Patient Instructions  Follow up in 4 months    Chesley Mires, MD [place] 09/01/2017, 10:10 AM Pager:  [phone]  Flow Sheet  Pulmonary tests Spirometry 01/10/09>>FEV1 0.97(52%), FEV1% 47 March 2012>>Daliresp started RAST 05/04/17 >> negative, IgE 8 Spirometry 05/14/17 >> FEV1 0.91 (55%), FEV1% 48  Sleep tests PSG 11/14/11 >> AHI 0.3 ONO with RA 07/31/16 >> test time 6 hrs 41 min.  Average SpO2 87%, low SpO2 74%.  Spent 4 hrs 13 min with SpO2 < 88%.  Cardiac tests Echo 08/30/14 >> EF 60 to 17%, grade 1 diastolic dysfx, PAS 24 mmHG  Past Medical History: She  has a past medical history of Allergic rhinitis, Chest pain, non-cardiac, Chronic cystitis with hematuria, Chronic hypoxemic respiratory failure ([place]) (02/02/2015), COPD (chronic obstructive pulmonary disease) ([place]), DDD (degenerative disc disease), cervical, DDD (degenerative disc disease), lumbar, Depression with anxiety (04/15/2011), Diarrheal disease (Summer 2017), Diverticulosis of colon, Dysplastic nevus of upper extremity (04/2014), Family history of adverse reaction to anesthesia, Fibrocystic breast disease, GERD (gastroesophageal reflux disease), History of double vision, History of  home oxygen therapy, Hypertension, Hypothyroidism, Hypoxemia, Lesion of right native kidney (2017), Osteoarthritis, hip, bilateral, Postmenopausal atrophic vaginitis, Pulmonary nodule, Recurrent Clostridium difficile diarrhea, Recurrent UTI, Shingles (02/2014), and Spinal stenosis of lumbar region  with neurogenic claudication.  Past Surgical History: She  has a past surgical history that includes Cholecystectomy (2001); Tubal ligation (1974); Abdominal hysterectomy (1978); Breast cyst excision; Appendectomy; Tonsillectomy; Colonoscopy (04/2005; 12/06/2015); transthoracic echocardiogram (08/2014); Total hip arthroplasty (Right, 11/01/2014); Hip Closed Reduction (Right, 12/25/2015); and DEXA (05/18/2017).  Family History: Her family history includes Cancer in her paternal aunt and paternal grandfather; Cirrhosis in her father; Diabetes in her daughter; Goiter in her mother; Hernia in her paternal grandmother; Hypertension in her paternal grandmother; Psoriasis in her father; Stroke in her maternal grandfather and paternal grandmother.  Social History: She  reports that she quit smoking about 27 years ago. Her smoking use included cigarettes. She has a 70.00 pack-year smoking history. She has never used smokeless tobacco. She reports that she drinks alcohol. She reports that she does not use drugs.  Medications: Allergies as of 09/01/2017      Reactions   Losartan Other (See Comments)   hyperkalemia   Penicillins Hives, Swelling, Rash   Has patient had a PCN reaction causing immediate rash, facial/tongue/throat swelling, SOB or lightheadedness with hypotension: YES Has patient had a PCN reaction causing severe rash involving mucus membranes or skin necrosis: NO Has patient had a PCN reaction that required hospitalization NO Has patient had a PCN reaction occurring within the last 10 years: NO If all of the above answers are "NO", then may proceed with Cephalosporin use.   Cefixime [kdc:cefixime] Other (See Comments)   unspecified   Indocin [indomethacin] Other (See Comments)   Painful tongue and throat   Sulfa Antibiotics Other (See Comments)   unspecified   Ace Inhibitors Other (See Comments)   cough   Statins Other (See Comments)   Myalgias      Medication List        Accurate  as of 09/01/17 10:10 AM. Always use your most recent med list.          acetaminophen 500 MG tablet Commonly known as:  TYLENOL Take 500 mg by mouth every 6 (six) hours as needed for mild pain, moderate pain, fever or headache.   albuterol 108 (90 Base) MCG/ACT inhaler Commonly known as:  PROVENTIL HFA;VENTOLIN HFA INHALE 2 PUFFS BY MOUTH EVERY 4 HOURS AS NEEDED   albuterol (2.5 MG/3ML) 0.083% nebulizer solution Commonly known as:  PROVENTIL INHALE 1 VIAL VIA NEBULIZER EVERY 6 HOURS AS NEEDED FOR WHEEZING OR SHORTNESS OF BREATH   amLODipine 5 MG tablet Commonly known as:  NORVASC Take 1 tablet (5 mg total) by mouth daily.   aspirin EC 81 MG tablet Take 81 mg by mouth daily.   azelastine 0.1 % nasal spray Commonly known as:  ASTELIN Place 2 sprays into both nostrils 2 (two) times daily. Use in each nostril as directed   Biotin 10 MG Caps Take by mouth.   cetirizine 10 MG tablet Commonly known as:  ZYRTEC Take 1 tablet (10 mg total) by mouth daily.   diazepam 5 MG tablet Commonly known as:  VALIUM TAKE 1 TABLET BY MOUTH EVERY MORNING AND 2 TABLETS EVERY NIGHT AT BEDTIME   estradiol 0.1 MG/GM vaginal cream Commonly known as:  ESTRACE APPLY 1/2 INCH RIBBON VAGINALLY QHS FOR 14 DAYS THEN TWICE WEEKLY   EYE DROPS OP Apply to eye. SootheXP   Fish Oil  1000 MG Cpdr Take by mouth.   fluticasone 50 MCG/ACT nasal spray Commonly known as:  FLONASE Place 1 spray into both nostrils daily.   hyoscyamine 0.125 MG SL tablet Commonly known as:  LEVSIN SL 1-2 tabs po q6h prn lower abd pain and urgent loose stools   PROBIOTIC ACIDOPHILUS Caps Take 2 capsules by mouth daily.   PROBIOTIC DAILY PO Take 2 capsules by mouth daily.   roflumilast 500 MCG Tabs tablet Commonly known as:  DALIRESP Take 1 tablet (500 mcg total) by mouth daily.   SPIRIVA HANDIHALER 18 MCG inhalation capsule Generic drug:  tiotropium INHALE 1 CAPSULE VIA HANDIHALER EVERY DAY AT THE SAME TIME     SYNTHROID 88 MCG tablet Generic drug:  levothyroxine TAKE 1 TABLET BY MOUTH DAILY   triamcinolone cream 0.1 % Commonly known as:  KENALOG Apply 1 application topically 2 (two) times daily.   venlafaxine 50 MG tablet Commonly known as:  EFFEXOR TAKE 1 TABLET(50 MG) BY MOUTH TWICE DAILY   WIXELA INHUB 250-50 MCG/DOSE Aepb Generic drug:  Fluticasone-Salmeterol INHALE 1 PUFF INTO THE LUNGS TWICE DAILY   XIIDRA 5 % Soln Generic drug:  Lifitegrast Place 1 drop into both eyes 2 (two) times daily.

## 2017-09-01 NOTE — Telephone Encounter (Signed)
Noted  

## 2017-09-02 ENCOUNTER — Encounter: Payer: Self-pay | Admitting: Family Medicine

## 2017-09-02 ENCOUNTER — Ambulatory Visit (HOSPITAL_BASED_OUTPATIENT_CLINIC_OR_DEPARTMENT_OTHER)
Admission: RE | Admit: 2017-09-02 | Discharge: 2017-09-02 | Disposition: A | Discharge status: Home / Self Care | Payer: Medicare Other | Source: Ambulatory Visit | Attending: Family Medicine | Admitting: Family Medicine

## 2017-09-02 ENCOUNTER — Ambulatory Visit (INDEPENDENT_AMBULATORY_CARE_PROVIDER_SITE_OTHER): Payer: Medicare Other | Admitting: Family Medicine

## 2017-09-02 VITALS — BP 136/79 | HR 72 | Temp 97.4°F | Resp 16 | Ht <= 58 in | Wt 141.0 lb

## 2017-09-02 DIAGNOSIS — M79662 Pain in left lower leg: Secondary | ICD-10-CM

## 2017-09-02 DIAGNOSIS — I83893 Varicose veins of bilateral lower extremities with other complications: Secondary | ICD-10-CM

## 2017-09-02 DIAGNOSIS — E039 Hypothyroidism, unspecified: Secondary | ICD-10-CM | POA: Diagnosis not present

## 2017-09-02 DIAGNOSIS — M7989 Other specified soft tissue disorders: Secondary | ICD-10-CM

## 2017-09-02 DIAGNOSIS — I872 Venous insufficiency (chronic) (peripheral): Secondary | ICD-10-CM | POA: Insufficient documentation

## 2017-09-02 DIAGNOSIS — I10 Essential (primary) hypertension: Secondary | ICD-10-CM

## 2017-09-02 DIAGNOSIS — R600 Localized edema: Secondary | ICD-10-CM | POA: Diagnosis not present

## 2017-09-02 LAB — COMPREHENSIVE METABOLIC PANEL
ALT: 12 U/L (ref 0–35)
AST: 16 U/L (ref 0–37)
Albumin: 4.5 g/dL (ref 3.5–5.2)
Alkaline Phosphatase: 81 U/L (ref 39–117)
BUN: 11 mg/dL (ref 6–23)
CO2: 28 mEq/L (ref 19–32)
Calcium: 10 mg/dL (ref 8.4–10.5)
Chloride: 105 mEq/L (ref 96–112)
Creatinine, Ser: 0.87 mg/dL (ref 0.40–1.20)
GFR: 66.53 mL/min (ref >60.00)
Glucose, Bld: 111 mg/dL — ABNORMAL HIGH (ref 70–99)
Potassium: 3.5 mEq/L (ref 3.5–5.1)
Sodium: 142 mEq/L (ref 135–145)
Total Bilirubin: 0.8 mg/dL (ref 0.2–1.2)
Total Protein: 7 g/dL (ref 6.0–8.3)

## 2017-09-02 LAB — CBC WITH DIFFERENTIAL/PLATELET
Basophils Absolute: 0 10*3/uL (ref 0.0–0.1)
Basophils Relative: 0.5 % (ref 0.0–3.0)
Eosinophils Absolute: 0.1 10*3/uL (ref 0.0–0.7)
Eosinophils Relative: 1.5 % (ref 0.0–5.0)
HCT: 43 % (ref 36.0–46.0)
Hemoglobin: 14.2 g/dL (ref 12.0–15.0)
Lymphocytes Relative: 23.4 % (ref 12.0–46.0)
Lymphs Abs: 2 10*3/uL (ref 0.7–4.0)
MCHC: 33 g/dL (ref 30.0–36.0)
MCV: 88.4 fl (ref 78.0–100.0)
Monocytes Absolute: 0.4 10*3/uL (ref 0.1–1.0)
Monocytes Relative: 4.7 % (ref 3.0–12.0)
Neutro Abs: 5.8 10*3/uL (ref 1.4–7.7)
Neutrophils Relative %: 69.9 % (ref 43.0–77.0)
Platelets: 311 10*3/uL (ref 150.0–400.0)
RBC: 4.86 Mil/uL (ref 3.87–5.11)
RDW: 14 % (ref 11.5–15.5)
WBC: 8.4 10*3/uL (ref 4.0–10.5)

## 2017-09-02 LAB — PROTIME-INR
INR: 1.1 ratio — ABNORMAL HIGH (ref 0.8–1.0)
Prothrombin Time: 12.4 s (ref 9.6–13.1)

## 2017-09-02 LAB — TSH: TSH: 0.79 u[IU]/mL (ref 0.35–4.50)

## 2017-09-02 NOTE — Progress Notes (Signed)
Office Note 09/02/2017  CC:  Chief Complaint  Patient presents with  . Joint Swelling    left foot and ankle swelling    HPI:  Priscilla Cantrell is a 82 y.o. White female with hx of COPD, dep/anx, recurrent C diff, HTN, hypothyroidism, and multijoint DJD with hx of R THA and currently with end-stage L hip DJD who is currently being assessed by orthopedist for L THA. She was cleared for surgery from a pulm standpoint by her pulmonologist yesterday.  She complains of left leg swelling:  Onset 5d/a, worse with standing, better after sleeping all night.  Hurts some in L LL pretibial region.   R LL no swelling or pain.   She has had hx of bilat LL varicose veins but no signif venous insufficiency edema in the past.  No hx of DVT.  No fevers.  No SOB.  No cp.   No recent prolonged immobility, no recent surgical procedure.   Past Medical History:  Diagnosis Date  . Allergic rhinitis    with upper airway cough: as of 07/2017 pulm f/u she was instructed to use astelin, flonase, and saline more consistently  . Chest pain, non-cardiac    Cardiac CT showed no signif obstructive dz, mildly elevated calcium level (Dr. [name]).  . Chronic cystitis with hematuria    [name]  . Chronic hypoxemic respiratory failure ([place]) 02/02/2015   2L oxygen 24/7  . COPD (chronic obstructive pulmonary disease) ([facility])    GOLD 4.  spirometry 01/10/09 FEV 0.97(52%), FEV1% 47.  With chronic bronchitis as of 07/2017.  . DDD (degenerative disc disease), cervical    1998 MRI C5-6 impingemt (left)  . DDD (degenerative disc disease), lumbar   . Depression with anxiety 04/15/2011  . Diarrheal disease Summer 2017   ? 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).  As of 10/05/15 pt reports GI dx'd her with C diff and rx'd more flagyl and she is also on cholestyramine.  As of 02/2016, pt's sx's resolved  completely.  . Diverticulosis of colon    Procto '97 and colonoscopy 2002  . Dysplastic nevus of upper extremity 04/2014   R tricep ([name])  . Edema of both lower extremities due to peripheral venous insufficiency    Varicosities bilat, swelling L>R.  R baker's cyst.  . Family history of adverse reaction to anesthesia    mother died when patient age 71 receiving anesthesia for thyroid surgery  . Fibrocystic breast disease    w/fibroadenoma.  Bx's showed NO atypia (Dr. Margot Chimes).  Mammo neg 2008.  Marland Kitchen GERD (gastroesophageal reflux disease)   . History of double vision    Ophthalmologist, Dr. Herbert Deaner, is further evaluating this with MRI  orbits and limited brain (myesthenia gravis testing neg 07/2010)  . History of home oxygen therapy    uses 2 liters at hs  . Hypertension    EKG 03/2010 normal  . Hypothyroidism    Hashimoto's, dx'd 1989  . Hypoxemia    O2 86% RA with exertion 01/10/09, 2 liters oxygen with exertion and sleep  . Lesion of right native kidney 2017   found on CT 02/2015.  F/u MRI 03/31/15 showed it to be smaller and less likely of any significance but repeat CT renal protocol in 81mo was recommended by radiology.  This was done 10/26/15 and showed no interval change in the lesion, but b/c the lesion enhances with contrast,  radiol suggested urol consultation (b/c papillary renal cell ca could not be excluded).  Saw urol 03/06/16--stable on u/s 09/2016  . Osteoarthritis, hip, bilateral    End stage; Hx of R THA.  L THA planned.  Marland Kitchen Postmenopausal atrophic vaginitis    improved with estrace 2 X/week.  . Pulmonary nodule    CT chest 12/10, June 2011.  No nodule on CT 06/2010.  Marland Kitchen Recurrent Clostridium difficile diarrhea    Episode 09/19/2016: resolved with prolonged course of flagyl.  11/2016 recurrence treated with 10d of Dificid (Fidaxomicin) by GI (Dr. Earlean Shawl).  . Recurrent UTI    likely related to pt's atrophic vaginitis.  Urol started vaginal estrace 03/2016.  Marland Kitchen Shingles 02/2014   R  abd/flank  . Spinal stenosis of lumbar region with neurogenic claudication    2008 MRI (L3-4, L4-5, L5-S1)    Past Surgical History:  Procedure Laterality Date  . ABDOMINAL HYSTERECTOMY  1978   no BSO per pt--nonmalignant reasons  . APPENDECTOMY    . BREAST CYST EXCISION     Last screening mammogram 10/2010 was normal.  . CHOLECYSTECTOMY  2001  . COLONOSCOPY  04/2005; 12/06/2015   2007 NORMAL.  2017 adenomatous polyp x 1.  Mod sigmoid diverticulosis (Dr. Earlean Shawl).    Marland Kitchen DEXA  05/18/2017   NORMAL (T-score 0.1)  . HIP CLOSED REDUCTION Right 12/25/2015   Procedure: CLOSED REDUCTION HIP;  Surgeon: Nicholes Stairs, MD;  Location: [facility];  Service: Orthopedics;  Laterality: Right;  . TONSILLECTOMY    . TOTAL HIP ARTHROPLASTY Right 11/01/2014   Procedure: RIGHT TOTAL HIP ARTHROPLASTY;  Surgeon: Latanya Maudlin, MD;  Location: [facility];  Service: Orthopedics;  Laterality: Right;  . TRANSTHORACIC ECHOCARDIOGRAM  08/2014   EF 60-65%, grade I diast dysfxn  . TUBAL LIGATION  1974    Family History  Problem Relation Age of Onset  . Goiter Mother   . Psoriasis Father        od liver  . Cirrhosis Father   . Diabetes Daughter        Type 2  . Cancer Paternal Aunt        stomach?  . Stroke Maternal Grandfather   . Stroke Paternal Grandmother   . Hypertension Paternal Grandmother   . Hernia Paternal Grandmother   . Cancer Paternal Grandfather        lung    Social History   Socioeconomic History  . Marital status: Widowed    Spouse name: Not on file  . Number of children: 1  . Years of education: Not on file  . Highest education level: Not on file  Occupational History  . 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 Financial resource strain: Not on file  . Food insecurity:    Worry: Not on file    Inability: Not on file  . Transportation needs:    Medical: Not on file    Non-medical: Not on file  Tobacco Use  . Smoking status: Former Smoker     Packs/day: 2.00    Years: 35.00    Pack years: 70.00    Types: Cigarettes    Last attempt to quit: 02/03/1990    Years since quitting: 27.5  . Smokeless tobacco: Never Used  Substance and Sexual Activity  . Alcohol use: Yes    Comment: Rarely  . Drug use: No  . Sexual activity: Never  Lifestyle  . Physical activity:    Days per  week: Not on file    Minutes per session: Not on file  . Stress: Not on file  Relationships  . Social connections:    Talks on phone: Not on file    Gets together: Not on file    Attends religious service: Not on file    Active member of club or organization: Not on file    Attends meetings of clubs or organizations: Not on file    Relationship status: Not on file  . Intimate partner violence:    Fear of current or ex partner: Not on file    Emotionally abused: Not on file    Physically abused: Not on file    Forced sexual activity: Not on file  Other Topics Concern  . Not on file  Social History Narrative   Widowed since fall 2014   Lives in [place], [place].     Worked as Research officer, trade union, retired 2017.         Outpatient Medications Prior to Visit  Medication Sig Dispense Refill  . acetaminophen (TYLENOL) 500 MG tablet Take 500 mg by mouth every 6 (six) hours as needed for mild pain, moderate pain, fever or headache.    . albuterol (PROVENTIL HFA;VENTOLIN HFA) 108 (90 Base) MCG/ACT inhaler INHALE 2 PUFFS BY MOUTH EVERY 4 HOURS AS NEEDED 8.5 g 1  . albuterol (PROVENTIL) (2.5 MG/3ML) 0.083% nebulizer solution INHALE 1 VIAL VIA NEBULIZER EVERY 6 HOURS AS NEEDED FOR WHEEZING OR SHORTNESS OF BREATH 75 mL 1  . amLODipine (NORVASC) 5 MG tablet Take 1 tablet (5 mg total) by mouth daily. 90 tablet 3  . aspirin EC 81 MG tablet Take 81 mg by mouth daily.    Marland Kitchen azelastine (ASTELIN) 0.1 % nasal spray Place 2 sprays into both nostrils 2 (two) times daily. Use in each nostril as directed 30 mL 3  . cetirizine (ZYRTEC) 10 MG tablet Take 1 tablet (10 mg total)  by mouth daily. (Patient taking differently: Take 10-20 mg by mouth daily as needed for allergies. ) 30 tablet 11  . diazepam (VALIUM) 5 MG tablet TAKE 1 TABLET BY MOUTH EVERY MORNING AND 2 TABLETS EVERY NIGHT AT BEDTIME 90 tablet 5  . fluticasone (FLONASE) 50 MCG/ACT nasal spray Place 1 spray into both nostrils daily. 16 g 2  . Lactobacillus (PROBIOTIC ACIDOPHILUS) CAPS Take 2 capsules by mouth daily.    Marland Kitchen Lifitegrast (XIIDRA) 5 % SOLN Place 1 drop into both eyes 2 (two) times daily.    . Omega-3 Fatty Acids (FISH OIL) 1000 MG CPDR Take by mouth.    . Probiotic Product (PROBIOTIC DAILY PO) Take 2 capsules by mouth daily.     . roflumilast (DALIRESP) 500 MCG TABS tablet Take 1 tablet (500 mcg total) by mouth daily. 90 tablet 1  . SPIRIVA HANDIHALER 18 MCG inhalation capsule INHALE 1 CAPSULE VIA HANDIHALER EVERY DAY AT THE SAME TIME 30 capsule 6  . SYNTHROID 88 MCG tablet TAKE 1 TABLET BY MOUTH DAILY 30 tablet 5  . Tetrahydrozoline HCl (EYE DROPS OP) Apply to eye. SootheXP    . venlafaxine (EFFEXOR) 50 MG tablet TAKE 1 TABLET(50 MG) BY MOUTH TWICE DAILY 180 tablet 3  . WIXELA INHUB 250-50 MCG/DOSE AEPB INHALE 1 PUFF INTO THE LUNGS TWICE DAILY 1 each 3  . Biotin 10 MG CAPS Take by mouth.    . estradiol (ESTRACE) 0.1 MG/GM vaginal cream APPLY 1/2 INCH RIBBON VAGINALLY QHS FOR 14 DAYS THEN TWICE WEEKLY    .  hyoscyamine (LEVSIN SL) 0.125 MG SL tablet 1-2 tabs po q6h prn lower abd pain and urgent loose stools (Patient not taking: Reported on 09/02/2017) 30 tablet 3  . triamcinolone cream (KENALOG) 0.1 % Apply 1 application topically 2 (two) times daily. (Patient not taking: Reported on 09/02/2017) 30 g 0   No facility-administered medications prior to visit.     Allergies  Allergen Reactions  . Losartan Other (See Comments)    hyperkalemia  . Penicillins Hives, Swelling and Rash    Has patient had a PCN reaction causing immediate rash, facial/tongue/throat swelling, SOB or lightheadedness with  hypotension: YES Has patient had a PCN reaction causing severe rash involving mucus membranes or skin necrosis: NO Has patient had a PCN reaction that required hospitalization NO Has patient had a PCN reaction occurring within the last 10 years: NO If all of the above answers are "NO", then may proceed with Cephalosporin use.   . Cefixime [Kdc:Cefixime] Other (See Comments)    unspecified  . Indocin [Indomethacin] Other (See Comments)    Painful tongue and throat  . Sulfa Antibiotics Other (See Comments)    unspecified  . Ace Inhibitors Other (See Comments)    cough  . Statins Other (See Comments)    Myalgias      PE; Blood pressure 136/79, pulse 72, temperature (!) 97.4 F (36.3 C), temperature source Oral, resp. rate 16, height 4\' 9"  (1.448 m), weight 141 lb (64 kg), SpO2 96 %. Gen: Alert, well appearing.  Patient is oriented to person, place, time, and situation. AFFECT: pleasant, lucid thought and speech. LEGS: R LE with varicosities that don't appear inflamed.  1+pitting edema only in ankle.  No erythema or tenderness. L LL with mild increased circumference of calf compared to R calf. Left lower leg with 3+ pitting edema from just below knee down into ankle (1+ in ankle and over top of foot). There are scattered varicose veins w/out erythema. Mild TTP over pretibial region and medial aspect of popliteal fossa.   Small splotch of mild erythema/pinkish hued skin in lateral aspect of L pretibial region.  No warmth, no tenderness of this region.  Borders NOT well demarcated.  No streaking. Pt states she thinks she got a mosquito bite there and has been scratching at it. Definitely no firm signs of cellulitis or stasis dermatitis.  Pertinent labs:  Lab Results  Component Value Date   TSH 0.79 09/02/2017   Lab Results  Component Value Date   WBC 8.4 09/02/2017   HGB 14.2 09/02/2017   HCT 43.0 09/02/2017   MCV 88.4 09/02/2017   PLT 311.0 09/02/2017   Lab Results   Component Value Date   CREATININE 0.87 09/02/2017   BUN 11 09/02/2017   NA 142 09/02/2017   K 3.5 09/02/2017   CL 105 09/02/2017   CO2 28 09/02/2017   Lab Results  Component Value Date   ALT 12 09/02/2017   AST 16 09/02/2017   ALKPHOS 81 09/02/2017   BILITOT 0.8 09/02/2017   Lab Results  Component Value Date   CHOL 193 06/18/2015   Lab Results  Component Value Date   HDL 61.30 06/18/2015   Lab Results  Component Value Date   LDLCALC 106 (H) 06/18/2015   Lab Results  Component Value Date   TRIG 124.0 06/18/2015   Lab Results  Component Value Date   CHOLHDL 3 06/18/2015   Lab Results  Component Value Date   HGBA1C 5.7 10/29/2011   ASSESSMENT AND  PLAN:   Left lower leg swelling. She does have evidence of LE venous insufficiency bilat. I think she has asymmetric edema related to this. Will obtain venous doppler u/s bilat to further evaluate, r/o DVT. Low suspicion of any cellulitis---but I circled the little pinkish splotch on L LL to see what this does over the next 1-2 d. No abx at this time, esp with her high risk of recurrent C diff. Check CBC w/diff, CMET, PT/INR, TSH.  She is getting L THA surgery soon, has appt for medical clearance with me in 1-2d.  An After Visit Summary was printed and given to the patient.  FOLLOW UP:  Return for keep f/u already arranged for tomorrow.  Signed:  Crissie Sickles, MD           09/02/2017

## 2017-09-04 ENCOUNTER — Telehealth: Payer: Self-pay | Admitting: General Practice

## 2017-09-04 ENCOUNTER — Other Ambulatory Visit: Payer: Self-pay

## 2017-09-04 ENCOUNTER — Encounter: Payer: Self-pay | Admitting: Family Medicine

## 2017-09-04 ENCOUNTER — Ambulatory Visit (INDEPENDENT_AMBULATORY_CARE_PROVIDER_SITE_OTHER): Payer: Medicare Other | Admitting: Family Medicine

## 2017-09-04 VITALS — BP 133/79 | HR 79 | Temp 98.0°F | Resp 16 | Ht 61.0 in | Wt 142.1 lb

## 2017-09-04 DIAGNOSIS — Z01818 Encounter for other preprocedural examination: Secondary | ICD-10-CM

## 2017-09-04 DIAGNOSIS — J449 Chronic obstructive pulmonary disease, unspecified: Secondary | ICD-10-CM

## 2017-09-04 DIAGNOSIS — I872 Venous insufficiency (chronic) (peripheral): Secondary | ICD-10-CM

## 2017-09-04 NOTE — Progress Notes (Signed)
Office Note 09/04/2017  CC:  Chief Complaint  Patient presents with  . Pre-op Exam    Hip replacement    HPI:  Priscilla Cantrell is a 82 y.o. White female who is here for medical clearance for L hip replacement surgery planned with [name] soon. She has recently gotten cleared by her pulmonologist. Also here for f/u recent L LL swelling.  LE venous doppler neg for DVT. No infection was suspected. I suspect this is asymmetric chronic venous insufficiency edema.  She says she feels pretty good, no change in LL swelling.  No new sx's.  Patient has hx of well controlled HTN and hypothyroidism. No hx of cardiac valvular dz, dysrhythmia, CAD, or CHF. No hx of OSA. She has COPD/chronic bronchitis, requires oxygen hs and prn daytime.  This has been well controlled lately.  She tolerated general anesthesia w/out problem for her most recent surgery for R hip replacement: 10/2014. Echocardiogram just prior to that surgery showed EF 60-65%, grd I DD.   Past Medical History:  Diagnosis Date  . Allergic rhinitis    with upper airway cough: as of 07/2017 pulm f/u she was instructed to use astelin, flonase, and saline more consistently  . Chest pain, non-cardiac    Cardiac CT showed no signif obstructive dz, mildly elevated calcium level (Dr. [name]).  . Chronic cystitis with hematuria    [name]  . Chronic hypoxemic respiratory failure ([place] [place]) 02/02/2015   2L oxygen 24/7  . COPD (chronic obstructive pulmonary disease) ([facility])    GOLD 4.  spirometry 01/10/09 FEV 0.97(52%), FEV1% 47.  With chronic bronchitis as of 07/2017.  . DDD (degenerative disc disease), cervical    1998 MRI C5-6 impingemt (left)  . DDD (degenerative disc disease), lumbar   . Depression with anxiety 04/15/2011  . Diarrheal disease Summer 2017   ? 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).  As of 10/05/15 pt  reports GI dx'd her with C diff and rx'd more flagyl and she is also on cholestyramine.  As of 02/2016, pt's sx's resolved completely.  . Diverticulosis of colon    Procto '97 and colonoscopy 2002  . Dysplastic nevus of upper extremity 04/2014   R tricep ([name])  . Edema of both lower extremities due to peripheral venous insufficiency    Varicosities bilat, swelling L>R.  R baker's cyst.  . Family history of adverse reaction to anesthesia    mother died when patient age 67 receiving anesthesia for thyroid surgery  . Fibrocystic breast disease    w/fibroadenoma.  Bx's showed NO atypia (Dr. Margot Chimes).  Mammo neg 2008.  Marland Kitchen GERD (gastroesophageal reflux disease)   . History of double vision    Ophthalmologist, Dr. Herbert Deaner, is further evaluating this with MRI  orbits and limited brain (myesthenia gravis testing neg 07/2010)  . History of home oxygen therapy    uses 2 liters at hs  . Hypertension    EKG 03/2010 normal  . Hypothyroidism    Hashimoto's, dx'd 1989  . Hypoxemia    O2 86% RA with exertion 01/10/09, 2 liters oxygen with exertion and sleep  . Lesion of right native kidney 2017   found on CT 02/2015.  F/u MRI 03/31/15 showed it to be smaller and less likely of any significance but repeat CT renal protocol in 52mo was recommended by radiology.  This was done 10/26/15 and showed no interval change in  the lesion, but b/c the lesion enhances with contrast, radiol suggested urol consultation (b/c papillary renal cell ca could not be excluded).  Saw urol 03/06/16--stable on u/s 09/2016  . Osteoarthritis, hip, bilateral    End stage; Hx of R THA.  L THA planned.  Marland Kitchen Postmenopausal atrophic vaginitis    improved with estrace 2 X/week.  . Pulmonary nodule    CT chest 12/10, June 2011.  No nodule on CT 06/2010.  Marland Kitchen Recurrent Clostridium difficile diarrhea    Episode 09/19/2016: resolved with prolonged course of flagyl.  11/2016 recurrence treated with 10d of Dificid (Fidaxomicin) by GI (Dr. Earlean Shawl).  .  Recurrent UTI    likely related to pt's atrophic vaginitis.  Urol started vaginal estrace 03/2016.  Marland Kitchen Shingles 02/2014   R abd/flank  . Spinal stenosis of lumbar region with neurogenic claudication    2008 MRI (L3-4, L4-5, L5-S1)    Past Surgical History:  Procedure Laterality Date  . ABDOMINAL HYSTERECTOMY  1978   no BSO per pt--nonmalignant reasons  . APPENDECTOMY    . BREAST CYST EXCISION     Last screening mammogram 10/2010 was normal.  . CHOLECYSTECTOMY  2001  . COLONOSCOPY  04/2005; 12/06/2015   2007 NORMAL.  2017 adenomatous polyp x 1.  Mod sigmoid diverticulosis (Dr. Earlean Shawl).    Marland Kitchen DEXA  05/18/2017   NORMAL (T-score 0.1)  . HIP CLOSED REDUCTION Right 12/25/2015   Procedure: CLOSED REDUCTION HIP;  Surgeon: Nicholes Stairs, MD;  Location: [facility];  Service: Orthopedics;  Laterality: Right;  . TONSILLECTOMY    . TOTAL HIP ARTHROPLASTY Right 11/01/2014   Procedure: RIGHT TOTAL HIP ARTHROPLASTY;  Surgeon: Latanya Maudlin, MD;  Location: [facility];  Service: Orthopedics;  Laterality: Right;  . TRANSTHORACIC ECHOCARDIOGRAM  08/2014   EF 60-65%, grade I diast dysfxn  . TUBAL LIGATION  1974    Family History  Problem Relation Age of Onset  . Goiter Mother   . Psoriasis Father        od liver  . Cirrhosis Father   . Diabetes Daughter        Type 2  . Cancer Paternal Aunt        stomach?  . Stroke Maternal Grandfather   . Stroke Paternal Grandmother   . Hypertension Paternal Grandmother   . Hernia Paternal Grandmother   . Cancer Paternal Grandfather        lung    Social History   Socioeconomic History  . Marital status: Widowed    Spouse name: Not on file  . Number of children: 1  . Years of education: Not on file  . Highest education level: Not on file  Occupational History  . 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 Financial resource strain: Not on file  . Food insecurity:    Worry: Not on file    Inability: Not on file  .  Transportation needs:    Medical: Not on file    Non-medical: Not on file  Tobacco Use  . Smoking status: Former Smoker    Packs/day: 2.00    Years: 35.00    Pack years: 70.00    Types: Cigarettes    Last attempt to quit: 02/03/1990    Years since quitting: 27.6  . Smokeless tobacco: Never Used  Substance and Sexual Activity  . Alcohol use: Yes    Comment: Rarely  . Drug use: No  . Sexual activity: Never  Lifestyle  .  Physical activity:    Days per week: Not on file    Minutes per session: Not on file  . Stress: Not on file  Relationships  . Social connections:    Talks on phone: Not on file    Gets together: Not on file    Attends religious service: Not on file    Active member of club or organization: Not on file    Attends meetings of clubs or organizations: Not on file    Relationship status: Not on file  . Intimate partner violence:    Fear of current or ex partner: Not on file    Emotionally abused: Not on file    Physically abused: Not on file    Forced sexual activity: Not on file  Other Topics Concern  . Not on file  Social History Narrative   Widowed since fall 2014   Lives in [place], [place].     Worked as Research officer, trade union, retired 2017.         Outpatient Medications Prior to Visit  Medication Sig Dispense Refill  . acetaminophen (TYLENOL) 500 MG tablet Take 500 mg by mouth every 6 (six) hours as needed for mild pain, moderate pain, fever or headache.    . albuterol (PROVENTIL HFA;VENTOLIN HFA) 108 (90 Base) MCG/ACT inhaler INHALE 2 PUFFS BY MOUTH EVERY 4 HOURS AS NEEDED 8.5 g 1  . albuterol (PROVENTIL) (2.5 MG/3ML) 0.083% nebulizer solution INHALE 1 VIAL VIA NEBULIZER EVERY 6 HOURS AS NEEDED FOR WHEEZING OR SHORTNESS OF BREATH 75 mL 1  . amLODipine (NORVASC) 5 MG tablet Take 1 tablet (5 mg total) by mouth daily. 90 tablet 3  . aspirin EC 81 MG tablet Take 81 mg by mouth daily.    Marland Kitchen azelastine (ASTELIN) 0.1 % nasal spray Place 2 sprays into both  nostrils 2 (two) times daily. Use in each nostril as directed 30 mL 3  . Biotin 10 MG CAPS Take by mouth.    . cetirizine (ZYRTEC) 10 MG tablet Take 1 tablet (10 mg total) by mouth daily. (Patient taking differently: Take 10-20 mg by mouth daily as needed for allergies. ) 30 tablet 11  . diazepam (VALIUM) 5 MG tablet TAKE 1 TABLET BY MOUTH EVERY MORNING AND 2 TABLETS EVERY NIGHT AT BEDTIME 90 tablet 5  . estradiol (ESTRACE) 0.1 MG/GM vaginal cream APPLY 1/2 INCH RIBBON VAGINALLY QHS FOR 14 DAYS THEN TWICE WEEKLY    . fluticasone (FLONASE) 50 MCG/ACT nasal spray Place 1 spray into both nostrils daily. 16 g 2  . hyoscyamine (LEVSIN SL) 0.125 MG SL tablet 1-2 tabs po q6h prn lower abd pain and urgent loose stools 30 tablet 3  . Lactobacillus (PROBIOTIC ACIDOPHILUS) CAPS Take 2 capsules by mouth daily.    Marland Kitchen Lifitegrast (XIIDRA) 5 % SOLN Place 1 drop into both eyes 2 (two) times daily.    . Omega-3 Fatty Acids (FISH OIL) 1000 MG CPDR Take by mouth.    . Probiotic Product (PROBIOTIC DAILY PO) Take 2 capsules by mouth daily.     . roflumilast (DALIRESP) 500 MCG TABS tablet Take 1 tablet (500 mcg total) by mouth daily. 90 tablet 1  . SPIRIVA HANDIHALER 18 MCG inhalation capsule INHALE 1 CAPSULE VIA HANDIHALER EVERY DAY AT THE SAME TIME 30 capsule 6  . SYNTHROID 88 MCG tablet TAKE 1 TABLET BY MOUTH DAILY 30 tablet 5  . Tetrahydrozoline HCl (EYE DROPS OP) Apply to eye. SootheXP    . triamcinolone cream (KENALOG) 0.1 %  Apply 1 application topically 2 (two) times daily. 30 g 0  . venlafaxine (EFFEXOR) 50 MG tablet TAKE 1 TABLET(50 MG) BY MOUTH TWICE DAILY 180 tablet 3  . WIXELA INHUB 250-50 MCG/DOSE AEPB INHALE 1 PUFF INTO THE LUNGS TWICE DAILY 1 each 3   No facility-administered medications prior to visit.     Allergies  Allergen Reactions  . Losartan Other (See Comments)    hyperkalemia  . Penicillins Hives, Swelling and Rash    Has patient had a PCN reaction causing immediate rash,  facial/tongue/throat swelling, SOB or lightheadedness with hypotension: YES Has patient had a PCN reaction causing severe rash involving mucus membranes or skin necrosis: NO Has patient had a PCN reaction that required hospitalization NO Has patient had a PCN reaction occurring within the last 10 years: NO If all of the above answers are "NO", then may proceed with Cephalosporin use.   . Cefixime [Kdc:Cefixime] Other (See Comments)    unspecified  . Indocin [Indomethacin] Other (See Comments)    Painful tongue and throat  . Sulfa Antibiotics Other (See Comments)    unspecified  . Ace Inhibitors Other (See Comments)    cough  . Statins Other (See Comments)    Myalgias     ROS Review of Systems  Constitutional: Negative for appetite change, chills, fatigue and fever.  HENT: Negative for congestion, dental problem, ear pain and sore throat.   Eyes: Negative for discharge, redness and visual disturbance.  Respiratory: Negative for cough, chest tightness, shortness of breath and wheezing.   Cardiovascular: Positive for leg swelling (Bilat LL venous insuff edema-stable). Negative for chest pain and palpitations.  Gastrointestinal: Negative for abdominal pain, blood in stool, diarrhea, nausea and vomiting.  Genitourinary: Negative for difficulty urinating, dysuria, flank pain, frequency, hematuria and urgency.  Musculoskeletal: Negative for arthralgias, back pain, joint swelling, myalgias and neck stiffness.  Skin: Negative for pallor and rash.  Neurological: Negative for dizziness, speech difficulty, weakness and headaches.  Hematological: Negative for adenopathy. Does not bruise/bleed easily.  Psychiatric/Behavioral: Negative for confusion and sleep disturbance. The patient is not nervous/anxious.     PE; Blood pressure 133/79, pulse 79, temperature 98 F (36.7 C), temperature source Oral, resp. rate 16, height 5\' 1"  (1.549 m), weight 142 lb 2 oz (64.5 kg), SpO2 97 %. Body mass index  is 26.85 kg/m.  Gen: Alert, well appearing.  Patient is oriented to person, place, time, and situation. AFFECT: pleasant, lucid thought and speech. CVE:LFYB: no injection, icteris, swelling, or exudate.  EOMI, PERRLA. Mouth: lips without lesion/swelling.  Oral mucosa pink and moist. Oropharynx without erythema, exudate, or swelling.  Neck - No masses or thyromegaly or limitation in range of motion CV: RRR, no m/r/g.   LUNGS: CTA bilat, nonlabored resps, good aeration in all lung fields.  Mildly prolonged exp phase. ABD: soft, NT, ND, BS normal.  No hepatospenomegaly or mass.  No bruits. EXT: no clubbing or cyanosis.  She has 2-3 + pitting edema in both LL's from knees down into ankles, maybe slightly more in L than R.  No erythema or excessive warmth.  Non inflamed varicose veins scattered throughout both legs.  Pertinent labs:  Lab Results  Component Value Date   TSH 0.79 09/02/2017   Lab Results  Component Value Date   WBC 8.4 09/02/2017   HGB 14.2 09/02/2017   HCT 43.0 09/02/2017   MCV 88.4 09/02/2017   PLT 311.0 09/02/2017   Lab Results  Component Value Date   CREATININE  0.87 09/02/2017   BUN 11 09/02/2017   NA 142 09/02/2017   K 3.5 09/02/2017   CL 105 09/02/2017   CO2 28 09/02/2017   Lab Results  Component Value Date   ALT 12 09/02/2017   AST 16 09/02/2017   ALKPHOS 81 09/02/2017   BILITOT 0.8 09/02/2017   Lab Results  Component Value Date   CHOL 193 06/18/2015   Lab Results  Component Value Date   HDL 61.30 06/18/2015   Lab Results  Component Value Date   LDLCALC 106 (H) 06/18/2015   Lab Results  Component Value Date   TRIG 124.0 06/18/2015   Lab Results  Component Value Date   CHOLHDL 3 06/18/2015   Lab Results  Component Value Date   HGBA1C 5.7 10/29/2011    ASSESSMENT AND PLAN:   1) COPD: stable. The current medical regimen is effective;  continue present plan and medications. She recently got pulm clearance for surgery.  2) Medical  clearance: no hx of CAD or other heart dz.  No uncontrolled systemic dz process occurring. No hx of past problems with anesthesia. Recent CBC, CMET, and TSH all normal.  3) LE venous insufficiency edema: discussed dx. Compression hose rx'd today. Low Na diet. Elevation of legs discussed. LE venous u/s 2 days ago NEG for DVT.  An After Visit Summary was printed and given to the patient.  FOLLOW UP:  Return in about 6 months (around 03/07/2018) for routine chronic illness f/u.  Signed:  Crissie Sickles, MD           09/04/2017

## 2017-09-04 NOTE — Telephone Encounter (Signed)
Called pt and left a detailed message on voicemail per DPR to inform that her paperwork was faxed to [employer]. Wanted to know if pt would like to pick up the originals at the front desk or if she would like them mailed to her.   Original papers were left on [name]'s Desk

## 2017-09-07 NOTE — Telephone Encounter (Signed)
SW pt, she requested that the original form be mailed to her.   Copy was made for chart, original has been mailed to pt as requested.

## 2017-09-10 DIAGNOSIS — D3001 Benign neoplasm of right kidney: Secondary | ICD-10-CM | POA: Diagnosis not present

## 2017-09-10 DIAGNOSIS — N3021 Other chronic cystitis with hematuria: Secondary | ICD-10-CM | POA: Diagnosis not present

## 2017-09-10 DIAGNOSIS — R3915 Urgency of urination: Secondary | ICD-10-CM | POA: Diagnosis not present

## 2017-09-19 ENCOUNTER — Other Ambulatory Visit: Payer: Self-pay | Admitting: Family Medicine

## 2017-09-20 ENCOUNTER — Encounter: Payer: Self-pay | Admitting: Family Medicine

## 2017-10-01 NOTE — H&P (Signed)
TOTAL HIP ADMISSION H&P  Patient is admitted for left total hip arthroplasty, anterior approach.  Subjective:  Chief Complaint:   Left hip primary OA /pain  HPI: Priscilla Cantrell, 82 y.o. female, has a history of pain and functional disability in the left hip(s) due to arthritis and patient has failed non-surgical conservative treatments for greater than 12 weeks to include NSAID's and/or analgesics, use of assistive devices and activity modification.  Onset of symptoms was gradual starting 3-4 years ago with gradually worsening course since that time.The patient noted prior procedures of the hip to include arthroplasty on the right hip 3-4 years ago by Dr. Gladstone Lighter.  Patient currently rates pain in the left hip at 9 out of 10 with activity. Patient has worsening of pain with activity and weight bearing, trendelenberg gait, pain that interfers with activities of daily living and pain with passive range of motion. Patient has evidence of periarticular osteophytes and joint space narrowing by imaging studies. This condition presents safety issues increasing the risk of falls.  There is no current active infection.  Risks, benefits and expectations were discussed with the patient.  Risks including but not limited to the risk of anesthesia, blood clots, nerve damage, blood vessel damage, failure of the prosthesis, infection and up to and including death.  Patient understand the risks, benefits and expectations and wishes to proceed with surgery.   PCP: [name], MD  D/C Plans:       Home   Post-op Meds:       No Rx given   Tranexamic Acid:      To be given - IV   Decadron:      Is to be given  FYI:     ASA  Norco  DME:   Pt already has equipment   PT:   No PT    Patient Active Problem List   Diagnosis Date Noted  . Postherpetic neuralgia 03/07/2015  . Constipation 02/28/2015  . Renal mass 02/28/2015  . Acute pyelonephritis 02/20/2015  . Oral thrush 02/08/2015  . Chronic  hypoxemic respiratory failure ([place]) 02/02/2015  . SOB (shortness of breath)   . Essential hypertension 01/24/2015  . UTI (urinary tract infection) 01/09/2015  . Pain and swelling of right lower leg 01/09/2015  . H/O total hip arthroplasty 11/01/2014  . Spinal stenosis, lumbar region, with neurogenic claudication 01/04/2014  . Osteoarthritis, hip, bilateral 07/27/2013  . Sciatica associated with disorder of lumbosacral spine 04/19/2013  . Atrophic vaginitis 01/12/2012  . Periodic limb movement disorder 11/24/2011  . Hypoxia 05/22/2011  . Depression with anxiety 04/15/2011  . Chronic venous insufficiency 07/29/2010  . Hypothyroidism due to Hashimoto's thyroiditis 01/07/2010  . SPINAL STENOSIS 01/07/2010  . Allergic rhinitis 08/09/2007  . COPD with chronic bronchitis ([place]) 08/09/2007   Past Medical History:  Diagnosis Date  . Allergic rhinitis    with upper airway cough: as of 07/2017 pulm f/u she was instructed to use astelin, flonase, and saline more consistently  . Chest pain, non-cardiac    Cardiac CT showed no signif obstructive dz, mildly elevated calcium level (Dr. [name]).  . Chronic cystitis with hematuria    [name]  . Chronic hypoxemic respiratory failure ([place]) 02/02/2015   2L oxygen 24/7  . COPD (chronic obstructive pulmonary disease) ([facility])    GOLD 4.  spirometry 01/10/09 FEV 0.97(52%), FEV1% 47.  With chronic bronchitis as of 07/2017.  . DDD (degenerative disc disease), cervical    1998 MRI C5-6 impingemt (left)  .  DDD (degenerative disc disease), lumbar   . Depression with anxiety 04/15/2011  . Diarrheal disease Summer 2017   ? 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).  As of 10/05/15 pt reports GI dx'd her with C diff and rx'd more flagyl and she is also on cholestyramine.  As of 02/2016, pt's sx's resolved completely.  . Diverticulosis of colon    Procto  '97 and colonoscopy 2002  . Dysplastic nevus of upper extremity 04/2014   R tricep ([name])  . Edema of both lower extremities due to peripheral venous insufficiency    Varicosities bilat, swelling L>R.  R baker's cyst.  . Family history of adverse reaction to anesthesia    mother died when patient age 13 receiving anesthesia for thyroid surgery  . Fibrocystic breast disease    w/fibroadenoma.  Bx's showed NO atypia (Dr. Margot Chimes).  Mammo neg 2008.  Marland Kitchen GERD (gastroesophageal reflux disease)   . History of double vision    Ophthalmologist, Dr. Herbert Deaner, is further evaluating this with MRI  orbits and limited brain (myesthenia gravis testing neg 07/2010)  . History of home oxygen therapy    uses 2 liters at hs  . Hypertension    EKG 03/2010 normal  . Hypothyroidism    Hashimoto's, dx'd 1989  . Hypoxemia    O2 86% RA with exertion 01/10/09, 2 liters oxygen with exertion and sleep  . Lesion of right native kidney 2017   found on CT 02/2015.  F/u MRI 03/31/15 showed it to be smaller and less likely of any significance but repeat CT renal protocol in 64mo was recommended by radiology.  This was done 10/26/15 and showed no interval change in the lesion, but b/c the lesion enhances with contrast, radiol rec'd urol consult (b/c pap renal RCC could not be excluded).  Saw urol 03/06/16--stable on u/s 09/2016 and 09/2017.  . Osteoarthritis, hip, bilateral    End stage; Hx of R THA.  L THA planned.  Marland Kitchen Postmenopausal atrophic vaginitis    improved with estrace 2 X/week.  . Pulmonary nodule    CT chest 12/10, June 2011.  No nodule on CT 06/2010.  Marland Kitchen Recurrent Clostridium difficile diarrhea    Episode 09/19/2016: resolved with prolonged course of flagyl.  11/2016 recurrence treated with 10d of Dificid (Fidaxomicin) by GI (Dr. Earlean Shawl).  . Recurrent UTI    likely related to pt's atrophic vaginitis.  Urol started vaginal estrace 03/2016.  Marland Kitchen Shingles 02/2014   R abd/flank  . Spinal stenosis of lumbar region with  neurogenic claudication    2008 MRI (L3-4, L4-5, L5-S1)    Past Surgical History:  Procedure Laterality Date  . ABDOMINAL HYSTERECTOMY  1978   no BSO per pt--nonmalignant reasons  . APPENDECTOMY    . BREAST CYST EXCISION     Last screening mammogram 10/2010 was normal.  . CHOLECYSTECTOMY  2001  . COLONOSCOPY  04/2005; 12/06/2015   2007 NORMAL.  2017 adenomatous polyp x 1.  Mod sigmoid diverticulosis (Dr. Earlean Shawl).    Marland Kitchen DEXA  05/18/2017   NORMAL (T-score 0.1)  . HIP CLOSED REDUCTION Right 12/25/2015   Procedure: CLOSED REDUCTION HIP;  Surgeon: Nicholes Stairs, MD;  Location: [facility];  Service: Orthopedics;  Laterality: Right;  . TONSILLECTOMY    . TOTAL HIP ARTHROPLASTY Right 11/01/2014   Procedure: RIGHT TOTAL HIP ARTHROPLASTY;  Surgeon: Latanya Maudlin, MD;  Location: [facility];  Service: Orthopedics;  Laterality: Right;  . TRANSTHORACIC ECHOCARDIOGRAM  08/2014   EF 60-65%, grade I diast dysfxn  . TUBAL LIGATION  1974    No current facility-administered medications for this encounter.    Current Outpatient Medications  Medication Sig Dispense Refill Last Dose  . acetaminophen (TYLENOL) 500 MG tablet Take 500 mg by mouth every 6 (six) hours as needed for mild pain, moderate pain, fever or headache.   Taking  . albuterol (PROVENTIL HFA;VENTOLIN HFA) 108 (90 Base) MCG/ACT inhaler INHALE 2 PUFFS BY MOUTH EVERY 4 HOURS AS NEEDED 8.5 g 1 Taking  . albuterol (PROVENTIL) (2.5 MG/3ML) 0.083% nebulizer solution INHALE 1 VIAL VIA NEBULIZER EVERY 6 HOURS AS NEEDED FOR WHEEZING OR SHORTNESS OF BREATH 75 mL 1 Taking  . amLODipine (NORVASC) 5 MG tablet TAKE 1 TABLET BY MOUTH EVERY DAY 90 tablet 1   . aspirin EC 81 MG tablet Take 81 mg by mouth daily.   Taking  . azelastine (ASTELIN) 0.1 % nasal spray Place 2 sprays into both nostrils 2 (two) times daily. Use in each nostril as directed 30 mL 3 Taking  . Biotin 10 MG CAPS Take by mouth.   Taking  . cetirizine (ZYRTEC) 10 MG tablet Take 1 tablet (10 mg  total) by mouth daily. (Patient taking differently: Take 10-20 mg by mouth daily as needed for allergies. ) 30 tablet 11 Taking  . diazepam (VALIUM) 5 MG tablet TAKE 1 TABLET BY MOUTH EVERY MORNING AND 2 TABLETS EVERY NIGHT AT BEDTIME 90 tablet 5 Taking  . estradiol (ESTRACE) 0.1 MG/GM vaginal cream APPLY 1/2 INCH RIBBON VAGINALLY QHS FOR 14 DAYS THEN TWICE WEEKLY   Taking  . fluticasone (FLONASE) 50 MCG/ACT nasal spray Place 1 spray into both nostrils daily. 16 g 2 Taking  . hyoscyamine (LEVSIN SL) 0.125 MG SL tablet 1-2 tabs po q6h prn lower abd pain and urgent loose stools 30 tablet 3 Taking  . Lactobacillus (PROBIOTIC ACIDOPHILUS) CAPS Take 2 capsules by mouth daily.   Taking  . Lifitegrast (XIIDRA) 5 % SOLN Place 1 drop into both eyes 2 (two) times daily.   Taking  . Omega-3 Fatty Acids (FISH OIL) 1000 MG CPDR Take by mouth.   Taking  . Probiotic Product (PROBIOTIC DAILY PO) Take 2 capsules by mouth daily.    Taking  . roflumilast (DALIRESP) 500 MCG TABS tablet Take 1 tablet (500 mcg total) by mouth daily. 90 tablet 1 Taking  . SPIRIVA HANDIHALER 18 MCG inhalation capsule INHALE 1 CAPSULE VIA HANDIHALER EVERY DAY AT THE SAME TIME 30 capsule 6 Taking  . SYNTHROID 88 MCG tablet TAKE 1 TABLET BY MOUTH DAILY 30 tablet 5 Taking  . Tetrahydrozoline HCl (EYE DROPS OP) Apply to eye. SootheXP   Taking  . triamcinolone cream (KENALOG) 0.1 % Apply 1 application topically 2 (two) times daily. 30 g 0 Taking  . venlafaxine (EFFEXOR) 50 MG tablet TAKE 1 TABLET(50 MG) BY MOUTH TWICE DAILY 180 tablet 3 Taking  . WIXELA INHUB 250-50 MCG/DOSE AEPB INHALE 1 PUFF INTO THE LUNGS TWICE DAILY 1 each 3 Taking   Allergies  Allergen Reactions  . Losartan Other (See Comments)    hyperkalemia  . Penicillins Hives, Swelling and Rash    Has patient had a PCN reaction causing immediate rash, facial/tongue/throat swelling, SOB or lightheadedness with hypotension: YES Has patient had a PCN reaction causing severe rash  involving mucus membranes or skin necrosis: NO Has patient had a PCN reaction that required hospitalization NO Has patient  had a PCN reaction occurring within the last 10 years: NO If all of the above answers are "NO", then may proceed with Cephalosporin use.   . Cefixime [Kdc:Cefixime] Other (See Comments)    unspecified  . Indocin [Indomethacin] Other (See Comments)    Painful tongue and throat  . Sulfa Antibiotics Other (See Comments)    unspecified  . Ace Inhibitors Other (See Comments)    cough  . Statins Other (See Comments)    Myalgias     Social History   Tobacco Use  . Smoking status: Former Smoker    Packs/day: 2.00    Years: 35.00    Pack years: 70.00    Types: Cigarettes    Last attempt to quit: 02/03/1990    Years since quitting: 27.6  . Smokeless tobacco: Never Used  Substance Use Topics  . Alcohol use: Yes    Comment: Rarely    Family History  Problem Relation Age of Onset  . Goiter Mother   . Psoriasis Father        od liver  . Cirrhosis Father   . Diabetes Daughter        Type 2  . Cancer Paternal Aunt        stomach?  . Stroke Maternal Grandfather   . Stroke Paternal Grandmother   . Hypertension Paternal Grandmother   . Hernia Paternal Grandmother   . Cancer Paternal Grandfather        lung     Review of Systems  Constitutional: Negative.   HENT: Negative.   Eyes: Negative.   Respiratory: Positive for shortness of breath (with exertion).   Cardiovascular: Negative.   Gastrointestinal: Negative.   Genitourinary: Negative.   Musculoskeletal: Positive for back pain and joint pain.  Skin: Negative.   Neurological: Negative.   Endo/Heme/Allergies: Positive for environmental allergies.  Psychiatric/Behavioral: Positive for depression. The patient is nervous/anxious.     Objective:  Physical Exam  Constitutional: She is oriented to person, place, and time. She appears well-developed.  HENT:  Head: Normocephalic.  Eyes: Pupils are equal,  round, and reactive to light.  Neck: Neck supple. No JVD present. No tracheal deviation present. No thyromegaly present.  Cardiovascular: Normal rate, regular rhythm and intact distal pulses.  Respiratory: Effort normal and breath sounds normal. No respiratory distress. She has no wheezes.  GI: Soft. There is no tenderness. There is no guarding.  Musculoskeletal:       Left hip: She exhibits decreased range of motion, decreased strength, tenderness and bony tenderness. She exhibits no swelling, no deformity and no laceration.  Lymphadenopathy:    She has no cervical adenopathy.  Neurological: She is alert and oriented to person, place, and time. A sensory deficit (tingling bilateral feet) is present.  Skin: Skin is warm and dry.  Psychiatric: She has a normal mood and affect.      Labs:  Estimated body mass index is 26.85 kg/m as calculated from the following:   Height as of 09/04/17: 5\' 1"  (1.549 m).   Weight as of 09/04/17: 64.5 kg.   Imaging Review Plain radiographs demonstrate severe degenerative joint disease of the left hip. The bone quality appears to be good for age and reported activity level.    Preoperative templating of the joint replacement has been completed, documented, and submitted to the Operating Room personnel in order to optimize intra-operative equipment management.     Assessment/Plan:  End stage arthritis, left hip  The patient history, physical examination, clinical judgement  of the provider and imaging studies are consistent with end stage degenerative joint disease of the left hip and total hip arthroplasty is deemed medically necessary. The treatment options including medical management, injection therapy, arthroscopy and arthroplasty were discussed at length. The risks and benefits of total hip arthroplasty were presented and reviewed. The risks due to aseptic loosening, infection, stiffness, dislocation/subluxation,  thromboembolic complications and  other imponderables were discussed.  The patient acknowledged the explanation, agreed to proceed with the plan and consent was signed. Patient is being admitted for inpatient treatment for surgery, pain control, PT, OT, prophylactic antibiotics, VTE prophylaxis, progressive ambulation and ADL's and discharge planning.The patient is planning to be discharged home.    [place] Austan Nicholl   PA-C  10/01/2017, 1:09 PM

## 2017-10-07 NOTE — Progress Notes (Signed)
Medical clearance Dr Anitra Lauth 09-04-17 epi c  pulm clearance Dr [name] 09-01-17 epic   ekg 05-10-17 epic   cxr 06-15-17 epic

## 2017-10-07 NOTE — Patient Instructions (Addendum)
Priscilla Cantrell  10/07/2017   Your procedure is scheduled on: 10-13-17   Report to [facility] Main  Entrance    Report to admitting at 6:00AM    Call this number if you have problems the morning of surgery [phone]     Remember: Do not eat food or drink liquids :After Midnight.     Take these medicines the morning of surgery with A SIP OF WATER: amlodipine, roflumilast (daliresp), synthroid, venlafaxine, tylenol if needed,nasal sprays, Spiriva inhaler, wixela inhaler, albuterol inhaler (please bring)                                You may not have any metal on your body including hair pins and              piercings  Do not wear jewelry, make-up, lotions, powders or perfumes, deodorant             Do not wear nail polish.  Do not shave  48 hours prior to surgery.              Do not bring valuables to the hospital. [place].  Contacts, dentures or bridgework may not be worn into surgery.  Leave suitcase in the car. After surgery it may be brought to your room.                Please read over the following fact sheets you were given: _____________________________________________________________________             [facility] - Preparing for Surgery Before surgery, you can play an important role.  Because skin is not sterile, your skin needs to be as free of germs as possible.  You can reduce the number of germs on your skin by washing with CHG (chlorahexidine gluconate) soap before surgery.  CHG is an antiseptic cleaner which kills germs and bonds with the skin to continue killing germs even after washing. Please DO NOT use if you have an allergy to CHG or antibacterial soaps.  If your skin becomes reddened/irritated stop using the CHG and inform your nurse when you arrive at Short Stay. Do not shave (including legs and underarms) for at least 48 hours prior to the first CHG shower.  You may shave  your face/neck. Please follow these instructions carefully:  1.  Shower with CHG Soap the night before surgery and the  morning of Surgery.  2.  If you choose to wash your hair, wash your hair first as usual with your  normal  shampoo.  3.  After you shampoo, rinse your hair and body thoroughly to remove the  shampoo.                           4.  Use CHG as you would any other liquid soap.  You can apply chg directly  to the skin and wash                       Gently with a scrungie or clean washcloth.  5.  Apply the CHG Soap to your body ONLY FROM THE NECK DOWN.   Do not use on face/  open                           Wound or open sores. Avoid contact with eyes, ears mouth and genitals (private parts).                       Wash face,  Genitals (private parts) with your normal soap.             6.  Wash thoroughly, paying special attention to the area where your surgery  will be performed.  7.  Thoroughly rinse your body with warm water from the neck down.  8.  DO NOT shower/wash with your normal soap after using and rinsing off  the CHG Soap.                9.  Pat yourself dry with a clean towel.            10.  Wear clean pajamas.            11.  Place clean sheets on your bed the night of your first shower and do not  sleep with pets. Day of Surgery : Do not apply any lotions/deodorants the morning of surgery.  Please wear clean clothes to the hospital/surgery center.  FAILURE TO FOLLOW THESE INSTRUCTIONS MAY RESULT IN THE CANCELLATION OF YOUR SURGERY PATIENT SIGNATURE_________________________________  NURSE SIGNATURE__________________________________  ________________________________________________________________________   [name]  An incentive spirometer is a tool that can help keep your lungs clear and active. This tool measures how well you are filling your lungs with each breath. Taking long deep breaths may help reverse or decrease the chance of developing  breathing (pulmonary) problems (especially infection) following:  A long period of time when you are unable to move or be active. BEFORE THE PROCEDURE   If the spirometer includes an indicator to show your best effort, your nurse or respiratory therapist will set it to a desired goal.  If possible, sit up straight or lean slightly forward. Try not to slouch.  Hold the incentive spirometer in an upright position. INSTRUCTIONS FOR USE  1. Sit on the edge of your bed if possible, or sit up as far as you can in bed or on a chair. 2. Hold the incentive spirometer in an upright position. 3. Breathe out normally. 4. Place the mouthpiece in your mouth and seal your lips tightly around it. 5. Breathe in slowly and as deeply as possible, raising the piston or the ball toward the top of the column. 6. Hold your breath for 3-5 seconds or for as long as possible. Allow the piston or ball to fall to the bottom of the column. 7. Remove the mouthpiece from your mouth and breathe out normally. 8. Rest for a few seconds and repeat Steps 1 through 7 at least 10 times every 1-2 hours when you are awake. Take your time and take a few normal breaths between deep breaths. 9. The spirometer may include an indicator to show your best effort. Use the indicator as a goal to work toward during each repetition. 10. After each set of 10 deep breaths, practice coughing to be sure your lungs are clear. If you have an incision (the cut made at the time of surgery), support your incision when coughing by placing a pillow or rolled up towels firmly against it. Once you are able to get out of bed, walk around indoors and  cough well. You may stop using the incentive spirometer when instructed by your caregiver.  RISKS AND COMPLICATIONS  Take your time so you do not get dizzy or light-headed.  If you are in pain, you may need to take or ask for pain medication before doing incentive spirometry. It is harder to take a deep  breath if you are having pain. AFTER USE  Rest and breathe slowly and easily.  It can be helpful to keep track of a log of your progress. Your caregiver can provide you with a simple table to help with this. If you are using the spirometer at home, follow these instructions: [place] IF:   You are having difficultly using the spirometer.  You have trouble using the spirometer as often as instructed.  Your pain medication is not giving enough relief while using the spirometer.  You develop fever of 100.5 F (38.1 C) or higher. SEEK IMMEDIATE MEDICAL CARE IF:   You cough up bloody sputum that had not been present before.  You develop fever of 102 F (38.9 C) or greater.  You develop worsening pain at or near the incision site. MAKE SURE YOU:   Understand these instructions.  Will watch your condition.  Will get help right away if you are not doing well or get worse. Document Released: 06/02/2006 Document Revised: 04/14/2011 Document Reviewed: 08/03/2006 ExitCare Patient Information 2014 ExitCare, [place].   ________________________________________________________________________  WHAT IS A BLOOD TRANSFUSION? Blood Transfusion Information  A transfusion is the replacement of blood or some of its parts. Blood is made up of multiple cells which provide different functions.  Red blood cells carry oxygen and are used for blood loss replacement.  White blood cells fight against infection.  Platelets control bleeding.  Plasma helps clot blood.  Other blood products are available for specialized needs, such as hemophilia or other clotting disorders. BEFORE THE TRANSFUSION  Who gives blood for transfusions?   Healthy volunteers who are fully evaluated to make sure their blood is safe. This is blood bank blood. Transfusion therapy is the safest it has ever been in the practice of medicine. Before blood is taken from a donor, a complete history is taken to make sure  that person has no history of diseases nor engages in risky social behavior (examples are intravenous drug use or sexual activity with multiple partners). The donor's travel history is screened to minimize risk of transmitting infections, such as malaria. The donated blood is tested for signs of infectious diseases, such as HIV and hepatitis. The blood is then tested to be sure it is compatible with you in order to minimize the chance of a transfusion reaction. If you or a relative donates blood, this is often done in anticipation of surgery and is not appropriate for emergency situations. It takes many days to process the donated blood. RISKS AND COMPLICATIONS Although transfusion therapy is very safe and saves many lives, the main dangers of transfusion include:   Getting an infectious disease.  Developing a transfusion reaction. This is an allergic reaction to something in the blood you were given. Every precaution is taken to prevent this. The decision to have a blood transfusion has been considered carefully by your caregiver before blood is given. Blood is not given unless the benefits outweigh the risks. AFTER THE TRANSFUSION  Right after receiving a blood transfusion, you will usually feel much better and more energetic. This is especially true if your red blood cells have gotten low (anemic).  The transfusion raises the level of the red blood cells which carry oxygen, and this usually causes an energy increase.  The nurse administering the transfusion will monitor you carefully for complications. HOME CARE INSTRUCTIONS  No special instructions are needed after a transfusion. You may find your energy is better. Speak with your caregiver about any limitations on activity for underlying diseases you may have. SEEK MEDICAL CARE IF:   Your condition is not improving after your transfusion.  You develop redness or irritation at the intravenous (IV) site. SEEK IMMEDIATE MEDICAL CARE IF:  Any of  the following symptoms occur over the next 12 hours:  Shaking chills.  You have a temperature by mouth above 102 F (38.9 C), not controlled by medicine.  Chest, back, or muscle pain.  People around you feel you are not acting correctly or are confused.  Shortness of breath or difficulty breathing.  Dizziness and fainting.  You get a rash or develop hives.  You have a decrease in urine output.  Your urine turns a dark color or changes to pink, red, or brown. Any of the following symptoms occur over the next 10 days:  You have a temperature by mouth above 102 F (38.9 C), not controlled by medicine.  Shortness of breath.  Weakness after normal activity.  The white part of the eye turns yellow (jaundice).  You have a decrease in the amount of urine or are urinating less often.  Your urine turns a dark color or changes to pink, red, or brown. Document Released: 01/18/2000 Document Revised: 04/14/2011 Document Reviewed: 09/06/2007 [facility] Patient Information 2014 [place], [place].  _______________________________________________________________________

## 2017-10-08 ENCOUNTER — Encounter: Payer: Self-pay | Admitting: Family Medicine

## 2017-10-08 ENCOUNTER — Ambulatory Visit (INDEPENDENT_AMBULATORY_CARE_PROVIDER_SITE_OTHER): Payer: Medicare Other | Admitting: Family Medicine

## 2017-10-08 ENCOUNTER — Encounter (HOSPITAL_COMMUNITY): Payer: Self-pay

## 2017-10-08 ENCOUNTER — Other Ambulatory Visit: Payer: Self-pay

## 2017-10-08 ENCOUNTER — Encounter (HOSPITAL_COMMUNITY)
Admission: RE | Admit: 2017-10-08 | Discharge: 2017-10-08 | Disposition: A | Discharge status: Home / Self Care | Payer: Medicare Other | Source: Ambulatory Visit | Attending: Orthopedic Surgery | Admitting: Orthopedic Surgery

## 2017-10-08 ENCOUNTER — Other Ambulatory Visit: Payer: Self-pay | Admitting: Pulmonary Disease

## 2017-10-08 VITALS — BP 133/73 | HR 65 | Temp 97.8°F | Resp 16 | Ht 61.0 in | Wt 142.1 lb

## 2017-10-08 DIAGNOSIS — J441 Chronic obstructive pulmonary disease with (acute) exacerbation: Secondary | ICD-10-CM

## 2017-10-08 DIAGNOSIS — J069 Acute upper respiratory infection, unspecified: Secondary | ICD-10-CM | POA: Diagnosis not present

## 2017-10-08 DIAGNOSIS — Z01812 Encounter for preprocedural laboratory examination: Secondary | ICD-10-CM | POA: Diagnosis not present

## 2017-10-08 DIAGNOSIS — M1612 Unilateral primary osteoarthritis, left hip: Secondary | ICD-10-CM | POA: Insufficient documentation

## 2017-10-08 DIAGNOSIS — M25552 Pain in left hip: Secondary | ICD-10-CM | POA: Diagnosis not present

## 2017-10-08 LAB — CBC
HCT: 41.7 % (ref 36.0–46.0)
Hemoglobin: 13.4 g/dL (ref 12.0–15.0)
MCH: 28.6 pg (ref 26.0–34.0)
MCHC: 32.1 g/dL (ref 30.0–36.0)
MCV: 89.1 fL (ref 78.0–100.0)
Platelets: 277 10*3/uL (ref 150–400)
RBC: 4.68 MIL/uL (ref 3.87–5.11)
RDW: 13.4 % (ref 11.5–15.5)
WBC: 6.5 10*3/uL (ref 4.0–10.5)

## 2017-10-08 LAB — SURGICAL PCR SCREEN
MRSA, PCR: NEGATIVE
Staphylococcus aureus: NEGATIVE

## 2017-10-08 LAB — BASIC METABOLIC PANEL
Anion gap: 8 (ref 5–15)
BUN: 17 mg/dL (ref 8–23)
CO2: 27 mmol/L (ref 22–32)
Calcium: 9.3 mg/dL (ref 8.9–10.3)
Chloride: 111 mmol/L (ref 98–111)
Creatinine, Ser: 0.94 mg/dL (ref 0.44–1.00)
GFR calc Af Amer: >60 mL/min (ref >60)
GFR calc non Af Amer: 56 mL/min — ABNORMAL LOW (ref >60)
Glucose, Bld: 120 mg/dL — ABNORMAL HIGH (ref 70–99)
Potassium: 3.7 mmol/L (ref 3.5–5.1)
Sodium: 146 mmol/L — ABNORMAL HIGH (ref 135–145)

## 2017-10-08 MED ORDER — PREDNISONE 20 MG PO TABS: ORAL_TABLET | ORAL | 0 refills | Status: DC

## 2017-10-08 MED ORDER — METHYLPREDNISOLONE ACETATE 80 MG/ML IJ SUSP
80.0000 mg | Freq: Once | INTRAMUSCULAR | Status: AC
  Administered 2017-10-08: 80 mg via INTRAMUSCULAR

## 2017-10-08 NOTE — Addendum Note (Signed)
Addended by: Gordy Councilman on: 10/08/2017 02:11 PM   Modules accepted: Orders

## 2017-10-08 NOTE — Progress Notes (Signed)
OFFICE VISIT  10/08/2017   CC:  Chief Complaint  Patient presents with  . Cough    congestion, runny nose   HPI:    Patient is a 82 y.o. Caucasian female with severe COPD who presents for cough. Runny nose, clear, onset 1 week ago.  No cough initially, but coughing did start yesterday.  No face pain, no ST.  No HA. Mucous production x 2d, whitish. Taking mucinex q12h and antihistamine and saline nasal spray. Today she started to feel fatigued.  No fever.  SOB only in extreme heat.  No change in oxygen requirement. No alb neb requirement.  No n/v/d or abd pain.  No CP, palpitations, or dizziness.   Of note, she is scheduled to get left total hip arthroplasty in 5 days. Oxygen sat at her pre-op visit today was 99%.  Past Medical History:  Diagnosis Date  . Allergic rhinitis    with upper airway cough: as of 07/2017 pulm f/u she was instructed to use astelin, flonase, and saline more consistently  . Chest pain, non-cardiac    Cardiac CT showed no signif obstructive dz, mildly elevated calcium level (Dr. [name]).  . Chronic cystitis with hematuria    [name]  . Chronic hypoxemic respiratory failure ([place]) 02/02/2015   2L oxygen 24/7  . COPD (chronic obstructive pulmonary disease) (HCC)    GOLD 4.  spirometry 01/10/09 FEV 0.97(52%), FEV1% 47.  With chronic bronchitis as of 07/2017.  . DDD (degenerative disc disease), cervical    1998 MRI C5-6 impingemt (left)  . DDD (degenerative disc disease), lumbar   . Depression with anxiety 04/15/2011  . Diarrheal disease Summer 2017   ? 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).  As of 10/05/15 pt reports GI dx'd her with C diff and rx'd more flagyl and she is also on cholestyramine.  As of 02/2016, pt's sx's resolved completely.  . Diverticulosis of colon    Procto '97 and colonoscopy 2002  . Dysplastic nevus of upper extremity  04/2014   R tricep ([name])  . Dyspnea    HX OF COPD   . Edema of both lower extremities due to peripheral venous insufficiency    Varicosities bilat, swelling L>R.  R baker's cyst.  . Family history of adverse reaction to anesthesia    mother died when patient age 44 receiving anesthesia for thyroid surgery  . Fibrocystic breast disease    w/fibroadenoma.  Bx's showed NO atypia (Dr. Margot Chimes).  Mammo neg 2008.  Marland Kitchen GERD (gastroesophageal reflux disease)   . History of double vision    Ophthalmologist, Dr. Herbert Deaner, is further evaluating this with MRI  orbits and limited brain (myesthenia gravis testing neg 07/2010)  . History of home oxygen therapy    uses 2 liters at hs  . Hypertension    EKG 03/2010 normal  . Hypothyroidism    Hashimoto's, dx'd 1989  . Hypoxemia    O2 86% RA with exertion 01/10/09, 2 liters oxygen with exertion and sleep  . Lesion of right native kidney 2017   found on CT 02/2015.  F/u MRI 03/31/15 showed it to be smaller and less likely of any significance but repeat CT renal protocol in 38mo was recommended by radiology.  This was done 10/26/15 and showed no interval change in the lesion, but b/c the lesion enhances with contrast, radiol rec'd urol consult (b/c pap renal RCC could not be  excluded).  Saw urol 03/06/16--stable on u/s 09/2016 and 09/2017.  . Osteoarthritis, hip, bilateral    End stage; Hx of R THA.  L THA planned.  Marland Kitchen Postmenopausal atrophic vaginitis    improved with estrace 2 X/week.  . Pulmonary nodule    CT chest 12/10, June 2011.  No nodule on CT 06/2010.  Marland Kitchen Recurrent Clostridium difficile diarrhea    Episode 09/19/2016: resolved with prolonged course of flagyl.  11/2016 recurrence treated with 10d of Dificid (Fidaxomicin) by GI (Dr. Earlean Shawl).  . Recurrent UTI    likely related to pt's atrophic vaginitis.  Urol started vaginal estrace 03/2016.  Marland Kitchen Shingles 02/2014   R abd/flank  . 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 Stuffy and runny nose    REPORTS TODAY 10-08-17 , SAYS THAT SHE'S MADE APPT WITH HER PCP AND WILL CONTACT HER SURGOEN IF PLACED ON ABX    Past Surgical History:  Procedure Laterality Date  . ABDOMINAL HYSTERECTOMY  1978   no BSO per pt--nonmalignant reasons  . APPENDECTOMY    . BREAST CYST EXCISION     Last screening mammogram 10/2010 was normal.  . CHOLECYSTECTOMY  2001  . COLONOSCOPY  04/2005; 12/06/2015   2007 NORMAL.  2017 adenomatous polyp x 1.  Mod sigmoid diverticulosis (Dr. Earlean Shawl).    Marland Kitchen DEXA  05/18/2017   NORMAL (T-score 0.1)  . HIP CLOSED REDUCTION Right 12/25/2015   Procedure: CLOSED REDUCTION HIP;  Surgeon: Nicholes Stairs, MD;  Location: [facility];  Service: Orthopedics;  Laterality: Right;  . TONSILLECTOMY    . TOTAL HIP ARTHROPLASTY Right 11/01/2014   Procedure: RIGHT TOTAL HIP ARTHROPLASTY;  Surgeon: Latanya Maudlin, MD;  Location: [facility];  Service: Orthopedics;  Laterality: Right;  . TRANSTHORACIC ECHOCARDIOGRAM  08/2014   EF 60-65%, grade I diast dysfxn  . TUBAL LIGATION  1974    Outpatient Medications Prior to Visit  Medication Sig Dispense Refill  . acetaminophen (TYLENOL) 500 MG tablet Take 1,000 mg by mouth 2 (two) times daily as needed for mild pain, moderate pain, fever or headache.     . albuterol (PROVENTIL HFA;VENTOLIN HFA) 108 (90 Base) MCG/ACT inhaler INHALE 2 PUFFS BY MOUTH EVERY 4 HOURS AS NEEDED (Patient taking differently: Inhale 2 puffs into the lungs every 4 (four) hours as needed for wheezing or shortness of breath. ) 8.5 g 1  . albuterol (PROVENTIL) (2.5 MG/3ML) 0.083% nebulizer solution INHALE 1 VIAL VIA NEBULIZER EVERY 6 HOURS AS NEEDED FOR WHEEZING OR SHORTNESS OF BREATH 75 mL 1  . amLODipine (NORVASC) 5 MG tablet TAKE 1 TABLET BY MOUTH EVERY DAY 90 tablet 1  . Artificial Tear Solution (SOOTHE XP) SOLN Place 1 drop into both eyes 2 (two) times daily as needed (irritation).    Marland Kitchen azelastine (ASTELIN) 0.1 % nasal spray Place 2 sprays  into both nostrils 2 (two) times daily. Use in each nostril as directed (Patient taking differently: Place 2 sprays into both nostrils 2 (two) times daily as needed for rhinitis or allergies. Use in each nostril as directed) 30 mL 3  . diazepam (VALIUM) 5 MG tablet TAKE 1 TABLET BY MOUTH EVERY MORNING AND 2 TABLETS EVERY NIGHT AT BEDTIME (Patient taking differently: Take 5 mg by mouth at bedtime. May take an additional 5 mg dose as needed for anxiety) 90 tablet 5  . diphenhydrAMINE (BENADRYL) 25 MG tablet Take 25 mg by mouth 3 (three) times daily  as needed for allergies.    Marland Kitchen estradiol (ESTRACE) 0.1 MG/GM vaginal cream Place 1 Applicatorful vaginally daily as needed (irritation).     . fluticasone (FLONASE) 50 MCG/ACT nasal spray Place 1 spray into both nostrils daily. (Patient taking differently: Place 1 spray into both nostrils daily as needed for allergies or rhinitis. ) 16 g 2  . guaiFENesin (MUCINEX) 600 MG 12 hr tablet Take 600 mg by mouth 2 (two) times daily as needed for to loosen phlegm.    . hydrocortisone cream 1 % Apply 1 application topically daily as needed for itching.    . Lactase (LACTAID FAST ACT) 9000 units TABS Take 9,000 Units by mouth daily as needed (dairy).    Marland Kitchen Lifitegrast (XIIDRA) 5 % SOLN Place 1 drop into both eyes 2 (two) times daily.    . Probiotic Product (PROBIOTIC DAILY PO) Take 1 capsule by mouth daily.     . roflumilast (DALIRESP) 500 MCG TABS tablet Take 1 tablet (500 mcg total) by mouth daily. 90 tablet 1  . SPIRIVA HANDIHALER 18 MCG inhalation capsule INHALE 1 CAPSULE VIA HANDIHALER EVERY DAY AT THE SAME TIME (Patient taking differently: Place 18 mcg into inhaler and inhale daily. ) 30 capsule 6  . SYNTHROID 88 MCG tablet TAKE 1 TABLET BY MOUTH DAILY 30 tablet 5  . venlafaxine (EFFEXOR) 50 MG tablet TAKE 1 TABLET(50 MG) BY MOUTH TWICE DAILY 180 tablet 3  . WIXELA INHUB 250-50 MCG/DOSE AEPB INHALE 1 PUFF INTO THE LUNGS TWICE DAILY 1 each 5  . aspirin EC 81 MG  tablet Take 81 mg by mouth daily.    . Omega-3 Fatty Acids (OMEGA-3 FISH OIL PO) Take 2,000 mg by mouth daily.    Marland Kitchen triamcinolone cream (KENALOG) 0.1 % Apply 1 application topically 2 (two) times daily. (Patient not taking: Reported on 10/06/2017) 30 g 0   No facility-administered medications prior to visit.     Allergies  Allergen Reactions  . Losartan Other (See Comments)    hyperkalemia  . Penicillins Hives, Swelling and Rash    Has patient had a PCN reaction causing immediate rash, facial/tongue/throat swelling, SOB or lightheadedness with hypotension: YES Has patient had a PCN reaction causing severe rash involving mucus membranes or skin necrosis: NO Has patient had a PCN reaction that required hospitalization NO Has patient had a PCN reaction occurring within the last 10 years: NO If all of the above answers are "NO", then may proceed with Cephalosporin use.   . Cefixime [Kdc:Cefixime] Other (See Comments)    unspecified  . Indocin [Indomethacin] Other (See Comments)    Painful tongue and throat  . Sulfa Antibiotics Other (See Comments)    unspecified  . Ace Inhibitors Other (See Comments)    cough  . Statins Other (See Comments)    Myalgias     ROS As per HPI  PE: Blood pressure 133/73, pulse 65, temperature 97.8 F (36.6 C), temperature source Oral, resp. rate 16, height 5\' 1"  (1.549 m), weight 142 lb 2 oz (64.5 kg), SpO2 95 %. Room air. VS: noted--normal. Gen: alert, NAD, NONTOXIC APPEARING. HEENT: eyes without injection, drainage, or swelling.  Ears: EACs clear, TMs with normal light reflex and landmarks.  Nose: Clear rhinorrhea, with some dried, crusty exudate adherent to mildly injected mucosa.  No purulent d/c.  No paranasal sinus TTP.  No facial swelling.  Throat and mouth without focal lesion.  No pharyngial swelling, erythema, or exudate.   Neck: supple, no LAD.  LUNGS: CTA bilat, nonlabored resps.   CV: RRR, no m/r/g. EXT: no c/c/e SKIN: no rash  LABS:     Chemistry      Component Value Date/Time   NA 146 (H) 10/08/2017 1142   K 3.7 10/08/2017 1142   CL 111 10/08/2017 1142   CO2 27 10/08/2017 1142   BUN 17 10/08/2017 1142   CREATININE 0.94 10/08/2017 1142   CREATININE 0.84 02/27/2016 1533      Component Value Date/Time   CALCIUM 9.3 10/08/2017 1142   ALKPHOS 81 09/02/2017 1057   AST 16 09/02/2017 1057   ALT 12 09/02/2017 1057   BILITOT 0.8 09/02/2017 1057     Lab Results  Component Value Date   WBC 6.5 10/08/2017   HGB 13.4 10/08/2017   HCT 41.7 10/08/2017   MCV 89.1 10/08/2017   PLT 277 10/08/2017    IMPRESSION AND PLAN:  URI with acute early/mild exacerbation of COPD suspected. Given her overall situation today and with surgery scheduled for 5d from now, plan is to give 80mg  depo medrol in office today, and start a 3d burst of prednisone tomorrow (40mg  qd x 3d).  She'll continue all chronic inhalers and has albuterol to use q4h prn. Mucinex for cough.  Oxygen as needed.  An After Visit Summary was printed and given to the patient.  FOLLOW UP: Return in about 4 days (around 10/12/2017) for f/u URI/COPD exac.  Signed:  Crissie Sickles, MD           10/08/2017

## 2017-10-08 NOTE — Patient Instructions (Signed)
Start taking your prednisone tomorrow (Friday).

## 2017-10-12 ENCOUNTER — Encounter: Payer: Self-pay | Admitting: Family Medicine

## 2017-10-12 ENCOUNTER — Ambulatory Visit: Payer: Medicare Other | Admitting: Family Medicine

## 2017-10-12 ENCOUNTER — Ambulatory Visit (HOSPITAL_BASED_OUTPATIENT_CLINIC_OR_DEPARTMENT_OTHER)
Admission: RE | Admit: 2017-10-12 | Discharge: 2017-10-12 | Disposition: A | Discharge status: Home / Self Care | Payer: Medicare Other | Source: Ambulatory Visit | Attending: Family Medicine | Admitting: Family Medicine

## 2017-10-12 ENCOUNTER — Ambulatory Visit (INDEPENDENT_AMBULATORY_CARE_PROVIDER_SITE_OTHER): Payer: Medicare Other | Admitting: Family Medicine

## 2017-10-12 VITALS — BP 129/73 | HR 74 | Temp 97.6°F | Resp 20 | Ht 61.0 in | Wt 143.2 lb

## 2017-10-12 DIAGNOSIS — J449 Chronic obstructive pulmonary disease, unspecified: Secondary | ICD-10-CM

## 2017-10-12 DIAGNOSIS — J42 Unspecified chronic bronchitis: Secondary | ICD-10-CM | POA: Diagnosis present

## 2017-10-12 DIAGNOSIS — R05 Cough: Secondary | ICD-10-CM | POA: Diagnosis not present

## 2017-10-12 MED ORDER — PREDNISONE 50 MG PO TABS: ORAL_TABLET | ORAL | 0 refills | Status: DC

## 2017-10-12 MED ORDER — GENTAMICIN SULFATE 40 MG/ML IJ SOLN
5.0000 mg/kg | INTRAVENOUS | Status: DC
  Filled 2017-10-12: qty 6.75

## 2017-10-12 MED ORDER — IPRATROPIUM-ALBUTEROL 0.5-2.5 (3) MG/3ML IN SOLN
3.0000 mL | Freq: Once | RESPIRATORY_TRACT | Status: AC
  Administered 2017-10-12: 3 mL via RESPIRATORY_TRACT

## 2017-10-12 NOTE — Patient Instructions (Signed)
I will call in antibiotic after chest xray received. Please call your orthopedic office and advise them they will need to postpone secondary to COPD flare.  I will extend the prednisone as well.  I will call you after I get the CXR back.

## 2017-10-12 NOTE — Progress Notes (Signed)
Priscilla Cantrell , 16-Mar-1935, 82 y.o., female MRN: [id number] Patient Care Team    Relationship Specialty Notifications Start End  [name], MD PCP - General   01/16/10    Comment: Arnetha Gula, MD Consulting Physician Cardiology  06/06/10   Monna Fam, MD Consulting Physician Ophthalmology  01/29/11   Chesley Mires, MD Consulting Physician Pulmonary Disease  12/21/12   Latanya Maudlin, MD Consulting Physician Orthopedic Surgery  09/26/14   Cleon Gustin, MD Consulting Physician Urology  03/10/16   Richmond Campbell, MD Consulting Physician Gastroenterology  10/28/16   Paralee Cancel, MD Consulting Physician Orthopedic Surgery  09/04/17     Chief Complaint  Patient presents with  . COPD     Subjective: Pt presents for an OV with complaints of worsening wheezing and phlegm production. She was evalated by her PCP 4 days ago and prescribed prednisone and steroid shot. She states she does not feel any better. She has noticed wheezing that woke her from sleep last few nights, increased phlegm production that is thick and white and fatigue, nasal congestion, runny nose (more than usual). She denies fever, chills, nausea or vomit. She used her albuterol inhaler about 6:30 am today.  She has past h/o of having difficulty recovering from COPD flares. Her Oxygen sat at pulm are 96-99%. She is on 2L [place] at night of supplemental O2.   Depression screen [facility] 2/9 09/04/2017 11/18/2016 10/13/2016 10/13/2016 09/18/2015  Decreased Interest 0 0 0 0 0  Down, Depressed, Hopeless 0 0 0 0 1  PHQ - 2 Score 0 0 0 0 1  Altered sleeping 0 0 0 - -  Tired, decreased energy 0 0 1 - -  Change in appetite 0 0 0 - -  Feeling bad or failure about yourself  0 0 0 - -  Trouble concentrating 0 0 0 - -  Moving slowly or fidgety/restless 0 0 0 - -  Suicidal thoughts 0 0 0 - -  PHQ-9 Score 0 0 1 - -  Difficult doing work/chores Not difficult at all - Somewhat difficult - -  Some recent data might be  hidden    Allergies  Allergen Reactions  . Losartan Other (See Comments)    hyperkalemia  . Penicillins Hives, Swelling and Rash    Has patient had a PCN reaction causing immediate rash, facial/tongue/throat swelling, SOB or lightheadedness with hypotension: YES Has patient had a PCN reaction causing severe rash involving mucus membranes or skin necrosis: NO Has patient had a PCN reaction that required hospitalization NO Has patient had a PCN reaction occurring within the last 10 years: NO If all of the above answers are "NO", then may proceed with Cephalosporin use.   . Cefixime [Kdc:Cefixime] Other (See Comments)    unspecified  . Indocin [Indomethacin] Other (See Comments)    Painful tongue and throat  . Sulfa Antibiotics Other (See Comments)    unspecified  . Ace Inhibitors Other (See Comments)    cough  . Statins Other (See Comments)    Myalgias    Social History   Tobacco Use  . Smoking status: Former Smoker    Packs/day: 2.00    Years: 35.00    Pack years: 70.00    Types: Cigarettes    Last attempt to quit: 02/03/1990    Years since quitting: 27.7  . Smokeless tobacco: Never Used  Substance Use Topics  . Alcohol use: Yes    Comment: Rarely  Past Medical History:  Diagnosis Date  . Allergic rhinitis    with upper airway cough: as of 07/2017 pulm f/u she was instructed to use astelin, flonase, and saline more consistently  . Chest pain, non-cardiac    Cardiac CT showed no signif obstructive dz, mildly elevated calcium level (Dr. [name]).  . Chronic cystitis with hematuria    [name]  . Chronic hypoxemic respiratory failure ([place]) 02/02/2015   2L oxygen 24/7  . COPD (chronic obstructive pulmonary disease) (HCC)    GOLD 4.  spirometry 01/10/09 FEV 0.97(52%), FEV1% 47.  With chronic bronchitis as of 07/2017.  . DDD (degenerative disc disease), cervical    1998 MRI C5-6 impingemt (left)  . DDD (degenerative disc disease), lumbar   . Depression with  anxiety 04/15/2011  . Diarrheal disease Summer 2017   ? 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).  As of 10/05/15 pt reports GI dx'd her with C diff and rx'd more flagyl and she is also on cholestyramine.  As of 02/2016, pt's sx's resolved completely.  . Diverticulosis of colon    Procto '97 and colonoscopy 2002  . Dysplastic nevus of upper extremity 04/2014   R tricep ([name])  . Dyspnea    HX OF COPD   . Edema of both lower extremities due to peripheral venous insufficiency    Varicosities bilat, swelling L>R.  R baker's cyst.  . Family history of adverse reaction to anesthesia    mother died when patient age 34 receiving anesthesia for thyroid surgery  . Fibrocystic breast disease    w/fibroadenoma.  Bx's showed NO atypia (Dr. Margot Chimes).  Mammo neg 2008.  Marland Kitchen GERD (gastroesophageal reflux disease)   . History of double vision    Ophthalmologist, Dr. Herbert Deaner, is further evaluating this with MRI  orbits and limited brain (myesthenia gravis testing neg 07/2010)  . History of home oxygen therapy    uses 2 liters at hs  . Hypertension    EKG 03/2010 normal  . Hypothyroidism    Hashimoto's, dx'd 1989  . Hypoxemia    O2 86% RA with exertion 01/10/09, 2 liters oxygen with exertion and sleep  . Lesion of right native kidney 2017   found on CT 02/2015.  F/u MRI 03/31/15 showed it to be smaller and less likely of any significance but repeat CT renal protocol in 43mo was recommended by radiology.  This was done 10/26/15 and showed no interval change in the lesion, but b/c the lesion enhances with contrast, radiol rec'd urol consult (b/c pap renal RCC could not be excluded).  Saw urol 03/06/16--stable on u/s 09/2016 and 09/2017.  . Osteoarthritis, hip, bilateral    End stage; Hx of R THA.  L THA planned.  Marland Kitchen Postmenopausal atrophic vaginitis    improved with estrace 2 X/week.  . Pulmonary nodule    CT chest  12/10, June 2011.  No nodule on CT 06/2010.  Marland Kitchen Recurrent Clostridium difficile diarrhea    Episode 09/19/2016: resolved with prolonged course of flagyl.  11/2016 recurrence treated with 10d of Dificid (Fidaxomicin) by GI (Dr. Earlean Shawl).  . Recurrent UTI    likely related to pt's atrophic vaginitis.  Urol started vaginal estrace 03/2016.  Marland Kitchen Shingles 02/2014   R abd/flank  . 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 Stuffy and runny nose    REPORTS  TODAY 10-08-17 , SAYS THAT SHE'S MADE APPT WITH HER PCP AND WILL CONTACT HER SURGOEN IF PLACED ON ABX   Past Surgical History:  Procedure Laterality Date  . ABDOMINAL HYSTERECTOMY  1978   no BSO per pt--nonmalignant reasons  . APPENDECTOMY    . BREAST CYST EXCISION     Last screening mammogram 10/2010 was normal.  . CHOLECYSTECTOMY  2001  . COLONOSCOPY  04/2005; 12/06/2015   2007 NORMAL.  2017 adenomatous polyp x 1.  Mod sigmoid diverticulosis (Dr. Earlean Shawl).    Marland Kitchen DEXA  05/18/2017   NORMAL (T-score 0.1)  . HIP CLOSED REDUCTION Right 12/25/2015   Procedure: CLOSED REDUCTION HIP;  Surgeon: Nicholes Stairs, MD;  Location: [facility];  Service: Orthopedics;  Laterality: Right;  . TONSILLECTOMY    . TOTAL HIP ARTHROPLASTY Right 11/01/2014   Procedure: RIGHT TOTAL HIP ARTHROPLASTY;  Surgeon: Latanya Maudlin, MD;  Location: [facility];  Service: Orthopedics;  Laterality: Right;  . TRANSTHORACIC ECHOCARDIOGRAM  08/2014   EF 60-65%, grade I diast dysfxn  . TUBAL LIGATION  1974   Family History  Problem Relation Age of Onset  . Goiter Mother   . Psoriasis Father        od liver  . Cirrhosis Father   . Diabetes Daughter        Type 2  . Cancer Paternal Aunt        stomach?  . Stroke Maternal Grandfather   . Stroke Paternal Grandmother   . Hypertension Paternal Grandmother   . Hernia Paternal Grandmother   . Cancer Paternal Grandfather        lung   Allergies as of 10/12/2017      Reactions   Losartan  Other (See Comments)   hyperkalemia   Penicillins Hives, Swelling, Rash   Has patient had a PCN reaction causing immediate rash, facial/tongue/throat swelling, SOB or lightheadedness with hypotension: YES Has patient had a PCN reaction causing severe rash involving mucus membranes or skin necrosis: NO Has patient had a PCN reaction that required hospitalization NO Has patient had a PCN reaction occurring within the last 10 years: NO If all of the above answers are "NO", then may proceed with Cephalosporin use.   Cefixime [kdc:cefixime] Other (See Comments)   unspecified   Indocin [indomethacin] Other (See Comments)   Painful tongue and throat   Sulfa Antibiotics Other (See Comments)   unspecified   Ace Inhibitors Other (See Comments)   cough   Statins Other (See Comments)   Myalgias      Medication List        Accurate as of 10/12/17  3:18 PM. Always use your most recent med list.          acetaminophen 500 MG tablet Commonly known as:  TYLENOL Take 1,000 mg by mouth 2 (two) times daily as needed for mild pain, moderate pain, fever or headache.   albuterol 108 (90 Base) MCG/ACT inhaler Commonly known as:  PROVENTIL HFA;VENTOLIN HFA INHALE 2 PUFFS BY MOUTH EVERY 4 HOURS AS NEEDED   albuterol (2.5 MG/3ML) 0.083% nebulizer solution Commonly known as:  PROVENTIL INHALE 1 VIAL VIA NEBULIZER EVERY 6 HOURS AS NEEDED FOR WHEEZING OR SHORTNESS OF BREATH   amLODipine 5 MG tablet Commonly known as:  NORVASC TAKE 1 TABLET BY MOUTH EVERY DAY   aspirin EC 81 MG tablet Take 81 mg by mouth daily.   azelastine 0.1 % nasal spray Commonly known as:  ASTELIN Place 2 sprays into both nostrils  2 (two) times daily. Use in each nostril as directed   diazepam 5 MG tablet Commonly known as:  VALIUM TAKE 1 TABLET BY MOUTH EVERY MORNING AND 2 TABLETS EVERY NIGHT AT BEDTIME   diphenhydrAMINE 25 MG tablet Commonly known as:  BENADRYL Take 25 mg by mouth 3 (three) times daily as needed for  allergies.   estradiol 0.1 MG/GM vaginal cream Commonly known as:  ESTRACE Place 1 Applicatorful vaginally daily as needed (irritation).   fluticasone 50 MCG/ACT nasal spray Commonly known as:  FLONASE Place 1 spray into both nostrils daily.   guaiFENesin 600 MG 12 hr tablet Commonly known as:  MUCINEX Take 600 mg by mouth 2 (two) times daily as needed for to loosen phlegm.   hydrocortisone cream 1 % Apply 1 application topically daily as needed for itching.   LACTAID FAST ACT 9000 units Tabs Generic drug:  Lactase Take 9,000 Units by mouth daily as needed (dairy).   OMEGA-3 FISH OIL PO Take 2,000 mg by mouth daily.   predniSONE 20 MG tablet Commonly known as:  DELTASONE 2 tabs po qd x 3d   predniSONE 50 MG tablet Commonly known as:  DELTASONE Take 1 tab daily for 4 days.   PROBIOTIC DAILY PO Take 1 capsule by mouth daily.   roflumilast 500 MCG Tabs tablet Commonly known as:  DALIRESP Take 1 tablet (500 mcg total) by mouth daily.   SOOTHE XP Soln Place 1 drop into both eyes 2 (two) times daily as needed (irritation).   SPIRIVA HANDIHALER 18 MCG inhalation capsule Generic drug:  tiotropium INHALE 1 CAPSULE VIA HANDIHALER EVERY DAY AT THE SAME TIME   SYNTHROID 88 MCG tablet Generic drug:  levothyroxine TAKE 1 TABLET BY MOUTH DAILY   triamcinolone cream 0.1 % Commonly known as:  KENALOG Apply 1 application topically 2 (two) times daily.   venlafaxine 50 MG tablet Commonly known as:  EFFEXOR TAKE 1 TABLET(50 MG) BY MOUTH TWICE DAILY   WIXELA INHUB 250-50 MCG/DOSE Aepb Generic drug:  Fluticasone-Salmeterol INHALE 1 PUFF INTO THE LUNGS TWICE DAILY   XIIDRA 5 % Soln Generic drug:  Lifitegrast Place 1 drop into both eyes 2 (two) times daily.       All past medical history, surgical history, allergies, family history, immunizations andmedications were updated in the EMR today and reviewed under the history and medication portions of their EMR.     ROS:  Negative, with the exception of above mentioned in HPI   Objective:  BP 129/73 (BP Location: Left Arm, Patient Position: Sitting, Cuff Size: Large)   Pulse 74   Temp 97.6 F (36.4 C)   Resp 20   Ht 5\' 1"  (1.549 m)   Wt 143 lb 4 oz (65 kg)   SpO2 97%   BMI 27.07 kg/m  Body mass index is 27.07 kg/m. Gen: Afebrile. No acute distress. Nontoxic in appearance, well developed, well nourished. 94% O2 initially.  HENT: AT. [place]. Bilateral TM visualized without erythema or fullness. MMM, no oral lesions. Bilateral nares without erythema, mild drainage present. Throat without erythema or exudates. Cough and hoarseness present. Eyes:Pupils Equal Round Reactive to light, Extraocular movements intact,  Conjunctiva without redness, discharge or icterus. Neck/lymp/endocrine: Supple,no lymphadenopathy CV: RRR, no edema Chest: Bilateral wheeze present > LLL. Mildly diminished air movement.  Abd: Soft. NTND. BS present. no Masses palpated. No rebound or guarding.  Neuro:  Normal gait. PERLA. EOMi. Alert. Oriented x3   No exam data present No results found. No  results found for this or any previous visit (from the past 24 hour(s)).  Assessment/Plan: KIMYATA MILICH is a 82 y.o. female present for OV for  COPD with chronic bronchitis ([place]) - Initial O2% 94%--> 97% after duoneb. Wheezing improved after tx, however LLL crackle/wheeze remained. Lengthy discussion today on increased risk for her general anesthesia tomorrow for her replacement, given COPD flare. Would recommened she postpone. - extended prednisone 4 days.  - CXR---> will call in abx after results. azith or doxy for COPD flare/ LQ if PNA.  - ipratropium-albuterol (DUONEB) 0.5-2.5 (3) MG/3ML nebulizer solution 3 mL - DG Chest 2 View; Future - F/U dependent on CXR   Reviewed expectations re: course of current medical issues.  Discussed self-management of symptoms.  Outlined signs and symptoms indicating need for more acute  intervention.  Patient verbalized understanding and all questions were answered.  Patient received an After-Visit Summary.    Orders Placed This Encounter  Procedures  . DG Chest 2 View     Note is dictated utilizing voice recognition software. Although note has been proof read prior to signing, occasional typographical errors still can be missed. If any questions arise, please do not hesitate to call for verification.   electronically signed by:  Howard Pouch, DO  [place]

## 2017-10-13 ENCOUNTER — Inpatient Hospital Stay (HOSPITAL_COMMUNITY): Admission: RE | Admit: 2017-10-13 | Payer: Medicare Other | Source: Ambulatory Visit | Admitting: Orthopedic Surgery

## 2017-10-13 ENCOUNTER — Telehealth: Payer: Self-pay | Admitting: Family Medicine

## 2017-10-13 ENCOUNTER — Encounter (HOSPITAL_COMMUNITY): Admission: RE | Payer: Self-pay | Source: Ambulatory Visit

## 2017-10-13 LAB — TYPE AND SCREEN
ABO/RH(D): O POS
Antibody Screen: NEGATIVE

## 2017-10-13 SURGERY — ARTHROPLASTY, HIP, TOTAL, ANTERIOR APPROACH
Anesthesia: Spinal | Site: Hip | Laterality: Left

## 2017-10-13 MED ORDER — DOXYCYCLINE HYCLATE 100 MG PO TABS: 100.0000 mg | ORAL_TABLET | Freq: Two times a day (BID) | ORAL | 0 refills | Status: DC

## 2017-10-13 NOTE — Telephone Encounter (Signed)
Patient notified and verbalized understanding. 

## 2017-10-13 NOTE — Telephone Encounter (Signed)
Please inform patient the following information: Her cxr did not show evidence of PNA. I have called a round of doxycycline to treat her COPD exacerbation. I called in extension of prednisone yesterday of 4 pills. The last pil she is to split in 1/2... Make the tx 6 days with the steroids. Follow up with her PCP in 2 weeks

## 2017-10-15 NOTE — Progress Notes (Signed)
@Patient  ID: Priscilla Cantrell, female    DOB: 11-Dec-1935, 82 y.o.   MRN: [id number]  Chief Complaint  Patient presents with  . Acute Visit    Short of breath, COPD    Referring provider: [name], MD  HPI:  82 year old female former smoker followed in our office for GOLD III COPD (based on: Spirometry 05/14/17 >> FEV1 0.91 (55%), FEV1% 48), nocturnal hypoxemia  PMH: GERD, hip pain, osteoarthritis Smoker/ Smoking History: Former smoker.  Quit in 1992.  70 pack years. Maintenance: Spiriva, Wixella, Daliresp  [name]  Recent [facility] Encounters:   09/01/2017-office visit-Sood Patient presents today for surgical clearance for hip replacement patient is not having much Plan: Moderate risk for perioperative perioperative pulmonary complications based on COPD and nocturnal hypoxemia she can proceed forward with surgery, continue Astelin, Flonase, Zyrtec and nasal irrigation, continue Spiriva, wixella, Daliresp, continue 2 L of oxygen at night, follow-up in 4 months  10/16/2017  - Visit   Patient reporting today after being evaluated by primary care.  Patient is currently on a doxycycline treatment as well as on her last day of a prednisone taper for COPD exacerbation.  Patient does endorse the fact that she is had increased shortness of breath and has been using her oxygen more throughout the day.  Patient is typically prescribed 2 L of nasal cannula at night.  Of note patient arrived today and was 86% on room air after ambulating to room.  With deep breaths patient was able to resume oxygenation around 90%.  Patient has been adherent to wixella, Spiriva, Daliresp.  Patient also reports over the last 3 days she is had to use her rescue inhaler about 2-3 times a day.   Patient reports that on 10/13/17 cough started and she started having trouble sleeping.  Patient reports that she did sleep better last night (10/15/17 in recliner).  9/10 - cough started  Of note  patient did report that prior to getting ill she was doing lots of cleaning around her house with lots of dust, using lots of chemicals and cleaning supplies, and cleaning her carpet by hand.  Patient reports she did that over the last 2 to 3 weeks and after her extensive cleaning episodes she started to have worsening of her breathing.  MMRC - Breathlessness Score 3 - I stop for breath after walking about 100 yards or after a few minutes on level ground (isle at grocery store is 112ft)    Tests:  10/12/2017-chest x-ray-stable chronic lung disease no acute findings with bronchitis, allergic rhinitis, nocturnal hypoxemia  Pulmonary tests Spirometry 01/10/09>>FEV1 0.97(52%), FEV1% 47 March 2012>>Daliresp started RAST 05/04/17 >> negative, IgE 8 Spirometry 05/14/17 >> FEV1 0.91 (55%), FEV1% 48  Sleep tests PSG 11/14/11 >> AHI 0.3 ONO with RA 07/31/16 >> test time 6 hrs 41 min.  Average SpO2 87%, low SpO2 74%.  Spent 4 hrs 13 min with SpO2 < 88%.  Cardiac tests Echo 08/30/14 >> EF 60 to 25%, grade 1 diastolic dysfx, PAS 24 mmHG  Chart Review:  10/13/2017-telephone encounter- patient treated with doxycycline and extended prednisone taper, patient follow-up with PCP 2 weeks   Specialty Problems      Pulmonary Problems   Allergic rhinitis    Qualifier: Diagnosis of  By: [name] MD, [name]        COPD GOLD III B           Hypoxia   SOB (shortness of breath)   Chronic  hypoxemic respiratory failure (HCC)      Allergies  Allergen Reactions  . Losartan Other (See Comments)    hyperkalemia  . Penicillins Hives, Swelling and Rash    Has patient had a PCN reaction causing immediate rash, facial/tongue/throat swelling, SOB or lightheadedness with hypotension: YES Has patient had a PCN reaction causing severe rash involving mucus membranes or skin necrosis: NO Has patient had a PCN reaction that required hospitalization NO Has patient had a PCN reaction occurring within the last 10 years:  NO If all of the above answers are "NO", then may proceed with Cephalosporin use.   . Cefixime [Kdc:Cefixime] Other (See Comments)    unspecified  . Indocin [Indomethacin] Other (See Comments)    Painful tongue and throat  . Sulfa Antibiotics Other (See Comments)    unspecified  . Ace Inhibitors Other (See Comments)    cough  . Statins Other (See Comments)    Myalgias     Immunization History  Administered Date(s) Administered  . Influenza Split 11/04/2010, 10/23/2011  . Influenza Whole 10/04/2009  . Influenza, High Dose Seasonal PF 11/07/2016  . Influenza,inj,Quad PF,6+ Mos 10/07/2012, 10/11/2014  . Influenza-Unspecified 11/07/2013, 11/01/2015  . Pneumococcal Conjugate-13 04/29/2013  . Pneumococcal Polysaccharide-23 02/03/2005, 11/28/2010  . Td 02/04/2008, 03/07/2010  . Zoster 02/10/2013  . Zoster Recombinat (Shingrix) 10/30/2016, 12/29/2016   Hold flu vaccine at this point in time due to his patient's COPD exacerbation  Past Medical History:  Diagnosis Date  . Allergic rhinitis    with upper airway cough: as of 07/2017 pulm f/u she was instructed to use astelin, flonase, and saline more consistently  . Chest pain, non-cardiac    Cardiac CT showed no signif obstructive dz, mildly elevated calcium level (Dr. [name]).  . Chronic cystitis with hematuria    [name]  . Chronic hypoxemic respiratory failure ([place]) 02/02/2015   2L oxygen 24/7  . COPD (chronic obstructive pulmonary disease) (HCC)    GOLD 4.  spirometry 01/10/09 FEV 0.97(52%), FEV1% 47.  With chronic bronchitis as of 07/2017.  . DDD (degenerative disc disease), cervical    1998 MRI C5-6 impingemt (left)  . DDD (degenerative disc disease), lumbar   . Depression with anxiety 04/15/2011  . Diarrheal disease Summer 2017   ? 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).  As of 10/05/15 pt reports GI dx'd  her with C diff and rx'd more flagyl and she is also on cholestyramine.  As of 02/2016, pt's sx's resolved completely.  . Diverticulosis of colon    Procto '97 and colonoscopy 2002  . Dysplastic nevus of upper extremity 04/2014   R tricep ([name])  . Dyspnea    HX OF COPD   . Edema of both lower extremities due to peripheral venous insufficiency    Varicosities bilat, swelling L>R.  R baker's cyst.  . Family history of adverse reaction to anesthesia    mother died when patient age 26 receiving anesthesia for thyroid surgery  . Fibrocystic breast disease    w/fibroadenoma.  Bx's showed NO atypia (Dr. Margot Chimes).  Mammo neg 2008.  Marland Kitchen GERD (gastroesophageal reflux disease)   . History of double vision    Ophthalmologist, Dr. Herbert Deaner, is further evaluating this with MRI  orbits and limited brain (myesthenia gravis testing neg 07/2010)  . History of home oxygen therapy    uses 2 liters at hs  . Hypertension  EKG 03/2010 normal  . Hypothyroidism    Hashimoto's, dx'd 1989  . Hypoxemia    O2 86% RA with exertion 01/10/09, 2 liters oxygen with exertion and sleep  . Lesion of right native kidney 2017   found on CT 02/2015.  F/u MRI 03/31/15 showed it to be smaller and less likely of any significance but repeat CT renal protocol in 71mo was recommended by radiology.  This was done 10/26/15 and showed no interval change in the lesion, but b/c the lesion enhances with contrast, radiol rec'd urol consult (b/c pap renal RCC could not be excluded).  Saw urol 03/06/16--stable on u/s 09/2016 and 09/2017.  . Osteoarthritis, hip, bilateral    End stage; Hx of R THA.  L THA planned.  Marland Kitchen Postmenopausal atrophic vaginitis    improved with estrace 2 X/week.  . Pulmonary nodule    CT chest 12/10, June 2011.  No nodule on CT 06/2010.  Marland Kitchen Recurrent Clostridium difficile diarrhea    Episode 09/19/2016: resolved with prolonged course of flagyl.  11/2016 recurrence treated with 10d of Dificid (Fidaxomicin) by GI (Dr. Earlean Shawl).  .  Recurrent UTI    likely related to pt's atrophic vaginitis.  Urol started vaginal estrace 03/2016.  Marland Kitchen Shingles 02/2014   R abd/flank  . 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 Stuffy and runny nose    REPORTS TODAY 10-08-17 , SAYS THAT SHE'S MADE APPT WITH HER PCP AND WILL CONTACT HER SURGOEN IF PLACED ON ABX    Tobacco History: Social History   Tobacco Use  Smoking Status Former Smoker  . Packs/day: 2.00  . Years: 35.00  . Pack years: 70.00  . Types: Cigarettes  . Last attempt to quit: 02/03/1990  . Years since quitting: 27.7  Smokeless Tobacco Never Used   Counseling given: Not Answered Continue to not smoke  Outpatient Encounter Medications as of 10/16/2017  Medication Sig  . acetaminophen (TYLENOL) 500 MG tablet Take 1,000 mg by mouth 2 (two) times daily as needed for mild pain, moderate pain, fever or headache.   . albuterol (PROVENTIL HFA;VENTOLIN HFA) 108 (90 Base) MCG/ACT inhaler INHALE 2 PUFFS BY MOUTH EVERY 4 HOURS AS NEEDED (Patient taking differently: Inhale 2 puffs into the lungs every 4 (four) hours as needed for wheezing or shortness of breath. )  . albuterol (PROVENTIL) (2.5 MG/3ML) 0.083% nebulizer solution INHALE 1 VIAL VIA NEBULIZER EVERY 6 HOURS AS NEEDED FOR WHEEZING OR SHORTNESS OF BREATH  . amLODipine (NORVASC) 5 MG tablet TAKE 1 TABLET BY MOUTH EVERY DAY  . Artificial Tear Solution (SOOTHE XP) SOLN Place 1 drop into both eyes 2 (two) times daily as needed (irritation).  Marland Kitchen aspirin EC 81 MG tablet Take 81 mg by mouth daily.  Marland Kitchen azelastine (ASTELIN) 0.1 % nasal spray Place 2 sprays into both nostrils 2 (two) times daily. Use in each nostril as directed (Patient taking differently: Place 2 sprays into both nostrils 2 (two) times daily as needed for rhinitis or allergies. Use in each nostril as directed)  . diazepam (VALIUM) 5 MG tablet TAKE 1 TABLET BY MOUTH EVERY MORNING AND 2 TABLETS EVERY NIGHT AT BEDTIME  (Patient taking differently: Take 5 mg by mouth at bedtime. May take an additional 5 mg dose as needed for anxiety)  . diphenhydrAMINE (BENADRYL) 25 MG tablet Take 25 mg by mouth 3 (three) times daily as needed for allergies.  Marland Kitchen doxycycline (VIBRA-TABS) 100 MG tablet  Take 1 tablet (100 mg total) by mouth 2 (two) times daily.  Marland Kitchen estradiol (ESTRACE) 0.1 MG/GM vaginal cream Place 1 Applicatorful vaginally daily as needed (irritation).   . fluticasone (FLONASE) 50 MCG/ACT nasal spray Place 1 spray into both nostrils daily. (Patient taking differently: Place 1 spray into both nostrils daily as needed for allergies or rhinitis. )  . guaiFENesin (MUCINEX) 600 MG 12 hr tablet Take 600 mg by mouth 2 (two) times daily as needed for to loosen phlegm.  . hydrocortisone cream 1 % Apply 1 application topically daily as needed for itching.  . Lactase (LACTAID FAST ACT) 9000 units TABS Take 9,000 Units by mouth daily as needed (dairy).  Marland Kitchen Lifitegrast (XIIDRA) 5 % SOLN Place 1 drop into both eyes 2 (two) times daily.  . Omega-3 Fatty Acids (OMEGA-3 FISH OIL PO) Take 2,000 mg by mouth daily.  . Probiotic Product (PROBIOTIC DAILY PO) Take 1 capsule by mouth daily.   . roflumilast (DALIRESP) 500 MCG TABS tablet Take 1 tablet (500 mcg total) by mouth daily.  Marland Kitchen SPIRIVA HANDIHALER 18 MCG inhalation capsule INHALE 1 CAPSULE VIA HANDIHALER EVERY DAY AT THE SAME TIME (Patient taking differently: Place 18 mcg into inhaler and inhale daily. )  . SYNTHROID 88 MCG tablet TAKE 1 TABLET BY MOUTH DAILY  . triamcinolone cream (KENALOG) 0.1 % Apply 1 application topically 2 (two) times daily.  Marland Kitchen venlafaxine (EFFEXOR) 50 MG tablet TAKE 1 TABLET(50 MG) BY MOUTH TWICE DAILY  . WIXELA INHUB 250-50 MCG/DOSE AEPB INHALE 1 PUFF INTO THE LUNGS TWICE DAILY  . [DISCONTINUED] predniSONE (DELTASONE) 50 MG tablet Take 1 tab daily for 4 days.  . predniSONE (DELTASONE) 10 MG tablet 4 tabs for 3 days, then 3 tabs for 3 days, 2 tabs for 3 days, then  1 tab for 3 days, then stop. Take with food in morning.  . [DISCONTINUED] predniSONE (DELTASONE) 20 MG tablet 2 tabs po qd x 3d (Patient not taking: Reported on 10/16/2017)  . [EXPIRED] ipratropium-albuterol (DUONEB) 0.5-2.5 (3) MG/3ML nebulizer solution 3 mL    No facility-administered encounter medications on file as of 10/16/2017.     Review of Systems  Review of Systems  Constitutional: Positive for fatigue. Negative for chills, fever and unexpected weight change.  HENT: Negative for congestion, ear pain, postnasal drip, sinus pressure and sinus pain.   Respiratory: Positive for cough (started 9/10 - thick white/brown mucous, dry wet cough, ) and shortness of breath. Negative for chest tightness and wheezing.   Cardiovascular: Negative for chest pain and palpitations.  Gastrointestinal: Negative for blood in stool, diarrhea, nausea and vomiting.  Genitourinary: Negative for dysuria, frequency and urgency.  Musculoskeletal: Negative for arthralgias.  Skin: Negative for color change.  Allergic/Immunologic: Negative for environmental allergies and food allergies.  Neurological: Negative for dizziness, light-headedness and headaches.  Psychiatric/Behavioral: Positive for sleep disturbance (10/13/17, did better in recliner ). Negative for dysphoric mood. The patient is not nervous/anxious.   All other systems reviewed and are negative.    Physical Exam  BP 124/70 (BP Location: Left Arm, Cuff Size: Normal)   Pulse 69   Temp 97.9 F (36.6 C) (Oral)   Ht 5' (1.524 m)   Wt 145 lb 3.2 oz (65.9 kg)   SpO2 90%   BMI 28.36 kg/m   Wt Readings from Last 5 Encounters:  10/16/17 145 lb 3.2 oz (65.9 kg)  10/12/17 143 lb 4 oz (65 kg)  10/08/17 142 lb (64.4 kg)  10/08/17 142 lb  2 oz (64.5 kg)  09/04/17 142 lb 2 oz (64.5 kg)     Physical Exam  Constitutional: She is oriented to person, place, and time and well-developed, well-nourished, and in no distress. No distress.  HENT:  Head:  Normocephalic and atraumatic.  Right Ear: Hearing, tympanic membrane, external ear and ear canal normal.  Left Ear: Hearing, tympanic membrane, external ear and ear canal normal.  Nose: Nose normal. Right sinus exhibits no maxillary sinus tenderness and no frontal sinus tenderness. Left sinus exhibits no maxillary sinus tenderness and no frontal sinus tenderness.  Mouth/Throat: Uvula is midline and oropharynx is clear and moist. No oropharyngeal exudate.  Eyes: Pupils are equal, round, and reactive to light.  Neck: Normal range of motion. Neck supple. No JVD present.  Cardiovascular: Normal rate, regular rhythm and normal heart sounds.  Pulmonary/Chest: Effort normal. No accessory muscle usage. No respiratory distress. She has no decreased breath sounds. She has wheezes (exp wheeze throughout entire exam ). She has no rhonchi.  Abdominal: Soft. Bowel sounds are normal. There is no tenderness.  Musculoskeletal: Normal range of motion. She exhibits no edema.  Lymphadenopathy:    She has no cervical adenopathy.  Neurological: She is alert and oriented to person, place, and time. Gait normal.  Walks with cane   Skin: Skin is warm and dry. She is not diaphoretic. No erythema.  Psychiatric: Mood, memory, affect and judgment normal.  Nursing note and vitals reviewed.   DuoNeb nebulizer in office Respiratory: Improved expiratory wheeze, still present on exam  Lab Results:  CBC    Component Value Date/Time   WBC 6.5 10/08/2017 1142   RBC 4.68 10/08/2017 1142   HGB 13.4 10/08/2017 1142   HCT 41.7 10/08/2017 1142   PLT 277 10/08/2017 1142   MCV 89.1 10/08/2017 1142   MCH 28.6 10/08/2017 1142   MCHC 32.1 10/08/2017 1142   RDW 13.4 10/08/2017 1142   LYMPHSABS 2.0 09/02/2017 1057   MONOABS 0.4 09/02/2017 1057   EOSABS 0.1 09/02/2017 1057   BASOSABS 0.0 09/02/2017 1057    BMET    Component Value Date/Time   NA 146 (H) 10/08/2017 1142   K 3.7 10/08/2017 1142   CL 111 10/08/2017 1142    CO2 27 10/08/2017 1142   GLUCOSE 120 (H) 10/08/2017 1142   BUN 17 10/08/2017 1142   CREATININE 0.94 10/08/2017 1142   CREATININE 0.84 02/27/2016 1533   CALCIUM 9.3 10/08/2017 1142   GFRNONAA 56 (L) 10/08/2017 1142   GFRAA >60 10/08/2017 1142    BNP    Component Value Date/Time   BNP 78.4 01/24/2015 1217    ProBNP No results found for: PROBNP  Imaging: Dg Chest 2 View  Result Date: 10/13/2017 CLINICAL DATA:  COPD, cough and congestion. EXAM: CHEST - 2 VIEW COMPARISON:  06/15/2017 FINDINGS: The heart size and mediastinal contours are within normal limits. Stable chronic lung disease with chronic bronchial and interstitial prominence present throughout both lungs. Stable bibasilar scarring. There is no evidence of pulmonary edema, consolidation, pneumothorax, nodule or pleural fluid. The visualized skeletal structures are unremarkable. IMPRESSION: Stable chronic lung disease.  No acute findings. Electronically Signed   By: Aletta Edouard M.D.   On: 10/13/2017 08:10      Assessment & Plan:   Pleasant 82 year old patient seen today for COPD exacerbation.  Will extend prednisone taper at this time.  I do not believe patient needs x-ray.  Patient to follow-up with our office in 2 weeks as well as complete  antibiotics from primary care.  Explained to patient that we need to know if her symptoms are worsening or not improving.  I will see her back in 2 weeks.  Patient agrees.  At 2-week follow-up can consider increasing with sella or switching patient's controller inhalers to help better manage her COPD.  COPD GOLD III B Duoneb neb treatment today   Prednisone 10mg  tablet  >>>4 tabs for 3 days, then 3 tabs for 3 days, 2 tabs for 3 days, then 1 tab for 3 days, then stop >>>take with food  >>>take in the morning    Continue doxycycline dose >>> 1 100 mg tablet every 12 hours till gone  >>>take with food  >>>wear sunscreen   Follow-up with our office in 2 weeks  Continue [place]  Do not think we need a chest x-ray today as chest x-ray was recently completed on 10/13/17  Note your daily symptoms > remember "red flags" for COPD:   >>>Increase in cough >>>increase in sputum production >>>increase in shortness of breath or activity  intolerance.   If you notice these symptoms, please call the office to be seen.    Chronic hypoxemic respiratory failure (HCC) Continue oxygen therapy as prescribed  Continue to monitor oxygen saturations when at home.  If oxygen saturations dropped below 90% then please apply oxygen.  Follow-up with our office in 2 weeks  Osteoarthritis, hip, bilateral Continue follow-up with primary care noted to work towards completing left hip surgery        Lauraine Rinne, NP 10/16/2017

## 2017-10-16 ENCOUNTER — Encounter: Payer: Self-pay | Admitting: Pulmonary Disease

## 2017-10-16 ENCOUNTER — Ambulatory Visit (INDEPENDENT_AMBULATORY_CARE_PROVIDER_SITE_OTHER): Payer: Medicare Other | Admitting: Pulmonary Disease

## 2017-10-16 VITALS — BP 124/70 | HR 69 | Temp 97.9°F | Ht 60.0 in | Wt 145.2 lb

## 2017-10-16 DIAGNOSIS — J441 Chronic obstructive pulmonary disease with (acute) exacerbation: Secondary | ICD-10-CM | POA: Diagnosis not present

## 2017-10-16 DIAGNOSIS — M160 Bilateral primary osteoarthritis of hip: Secondary | ICD-10-CM

## 2017-10-16 DIAGNOSIS — J9611 Chronic respiratory failure with hypoxia: Secondary | ICD-10-CM

## 2017-10-16 DIAGNOSIS — J449 Chronic obstructive pulmonary disease, unspecified: Secondary | ICD-10-CM

## 2017-10-16 MED ORDER — IPRATROPIUM-ALBUTEROL 0.5-2.5 (3) MG/3ML IN SOLN
3.0000 mL | Freq: Once | RESPIRATORY_TRACT | Status: AC
  Administered 2017-10-16: 3 mL via RESPIRATORY_TRACT

## 2017-10-16 MED ORDER — PREDNISONE 10 MG PO TABS: ORAL_TABLET | ORAL | 0 refills | Status: DC

## 2017-10-16 NOTE — Patient Instructions (Addendum)
Duoneb neb treatment today   Prednisone 10mg  tablet  >>>4 tabs for 3 days, then 3 tabs for 3 days, 2 tabs for 3 days, then 1 tab for 3 days, then stop >>>take with food  >>>take in the morning    Continue doxycycline dose >>> 1 100 mg tablet every 12 hours till gone  >>>take with food  >>>wear sunscreen    Follow-up with our office in 2 weeks  Continue [place]   Do not think we need a chest x-ray today as chest x-ray was recently completed on 94/10/19  Continue follow-up with primary care noted to work towards completing left hip surgery     Note your daily symptoms > remember "red flags" for COPD:   >>>Increase in cough >>>increase in sputum production >>>increase in shortness of breath or activity  intolerance.   If you notice these symptoms, please call the office to be seen.        It is flu season:   >>>Remember to be washing your hands regularly, using hand sanitizer, be careful to use around herself with has contact with people who are sick will increase her chances of getting sick yourself. >>> Best ways to protect herself from the flu: Receive the yearly flu vaccine, practice good hand hygiene washing with soap and also using hand sanitizer when available, eat a nutritious meals, get adequate rest, hydrate appropriately   Please contact the office if your symptoms worsen or you have concerns that you are not improving.   Thank you for choosing [facility] for your healthcare, and for allowing [place] to partner with you on your healthcare journey. I am thankful to be able to provide care to you today.   Wyn Quaker FNP-C        Chronic Obstructive Pulmonary Disease Exacerbation Chronic obstructive pulmonary disease (COPD) is a common lung problem. In COPD, the flow of air from the lungs is limited. COPD exacerbations are times that breathing gets worse and you need extra treatment. Without treatment they can be life threatening. If  they happen often, your lungs can become more damaged. If your COPD gets worse, your doctor may treat you with:  Medicines.  Oxygen.  Different ways to clear your airway, such as using a mask.  Follow these instructions at home:  Do not smoke.  Avoid tobacco smoke and other things that bother your lungs.  If given, take your antibiotic medicine as told. Finish the medicine even if you start to feel better.  Only take medicines as told by your doctor.  Drink enough fluids to keep your pee (urine) clear or pale yellow (unless your doctor has told you not to).  Use a cool mist machine (vaporizer).  If you use oxygen or a machine that turns liquid medicine into a mist (nebulizer), continue to use them as told.  Keep up with shots (vaccinations) as told by your doctor.  Exercise regularly.  Eat healthy foods.  Keep all doctor visits as told. Get help right away if:  You are very short of breath and it gets worse.  You have trouble talking.  You have bad chest pain.  You have blood in your spit (sputum).  You have a fever.  You keep throwing up (vomiting).  You feel weak, or you pass out (faint).  You feel confused.  You keep getting worse. This information is not intended to replace advice given to you by your health care provider. Make sure you  discuss any questions you have with your health care provider. Document Released: 01/09/2011 Document Revised: 06/28/2015 Document Reviewed: 09/24/2012 Elsevier Interactive Patient Education  2017 [employer].

## 2017-10-16 NOTE — Assessment & Plan Note (Signed)
Continue follow-up with primary care noted to work towards completing left hip surgery

## 2017-10-16 NOTE — Assessment & Plan Note (Addendum)
Duoneb neb treatment today   Prednisone 10mg  tablet  >>>4 tabs for 3 days, then 3 tabs for 3 days, 2 tabs for 3 days, then 1 tab for 3 days, then stop >>>take with food  >>>take in the morning    Continue doxycycline dose >>> 1 100 mg tablet every 12 hours till gone  >>>take with food  >>>wear sunscreen   Follow-up with our office in 2 weeks  Continue [place]  Do not think we need a chest x-ray today as chest x-ray was recently completed on 10/13/17  Note your daily symptoms > remember "red flags" for COPD:   >>>Increase in cough >>>increase in sputum production >>>increase in shortness of breath or activity  intolerance.   If you notice these symptoms, please call the office to be seen.

## 2017-10-16 NOTE — Assessment & Plan Note (Signed)
Continue oxygen therapy as prescribed  Continue to monitor oxygen saturations when at home.  If oxygen saturations dropped below 90% then please apply oxygen.  Follow-up with our office in 2 weeks

## 2017-10-20 NOTE — Progress Notes (Signed)
Reviewed and agree with assessment/plan.   Gordie Crumby, MD [facility] 01/30/2016, 12:24 PM Pager:  [phone]  

## 2017-10-28 ENCOUNTER — Ambulatory Visit (INDEPENDENT_AMBULATORY_CARE_PROVIDER_SITE_OTHER): Payer: Medicare Other | Admitting: Family Medicine

## 2017-10-28 ENCOUNTER — Encounter: Payer: Self-pay | Admitting: Family Medicine

## 2017-10-28 VITALS — BP 141/68 | HR 67 | Temp 98.1°F | Resp 16 | Ht 60.0 in | Wt 144.5 lb

## 2017-10-28 DIAGNOSIS — M1612 Unilateral primary osteoarthritis, left hip: Secondary | ICD-10-CM

## 2017-10-28 DIAGNOSIS — J302 Other seasonal allergic rhinitis: Secondary | ICD-10-CM | POA: Diagnosis not present

## 2017-10-28 DIAGNOSIS — Z01818 Encounter for other preprocedural examination: Secondary | ICD-10-CM | POA: Diagnosis not present

## 2017-10-28 DIAGNOSIS — J449 Chronic obstructive pulmonary disease, unspecified: Secondary | ICD-10-CM | POA: Diagnosis not present

## 2017-10-28 DIAGNOSIS — J9611 Chronic respiratory failure with hypoxia: Secondary | ICD-10-CM | POA: Diagnosis not present

## 2017-10-28 MED ORDER — MONTELUKAST SODIUM 10 MG PO TABS: 10.0000 mg | ORAL_TABLET | Freq: Every day | ORAL | 6 refills | Status: DC

## 2017-10-28 NOTE — Progress Notes (Signed)
OFFICE VISIT  10/28/2017   CC:  Chief Complaint  Patient presents with  . Follow-up    COPD   HPI:    Patient is a 82 y.o. Caucasian female who presents for f/u COPD. I last saw her 10/12/17 and she was beginning an acute exacerbation.  I treated her but unfortunately her hip surgery she had planned had to be postponed.  She saw pulm 10/16/17 and her prednisone taper was extended but no further abx rx'd, no imaging done.  Interim hx: Says COPD exac is resolved now.  Energy level still recovering some, but she is CHRONICALLY suffering from generalized weakness, esp due to the altered gait from her chronic hip pain..  No cough.  Some runny nose/pnd is bothering her constantly. Uses oxygen when doing housework, otherwise only using it during sleep. Has not had to use any albuterol in the last few days.  No fevers, no purulent mucous.   No diarrhea.    Patient has hx of well controlled HTN and hypothyroidism. No hx of cardiac valvular dz, dysrhythmia, CAD, or CHF. No hx of OSA. She has COPD/chronic bronchitis, requires oxygen hs and prn daytime.  She tolerated general anesthesia w/out problem for her most recent surgery for R hip replacement: 10/2014. Echocardiogram just prior to that surgery showed EF 60-65%, grd I DD.  Review of Systems  Constitutional: Positive for fatigue (chronic, mild). Negative for appetite change, chills and fever.  HENT: Positive for postnasal drip. Negative for congestion, dental problem, ear pain and sore throat.   Eyes: Negative for discharge, redness and visual disturbance.  Respiratory: Negative for cough, chest tightness, shortness of breath and wheezing.   Cardiovascular: Negative for chest pain, palpitations and leg swelling.  Gastrointestinal: Negative for abdominal pain, blood in stool, diarrhea, nausea and vomiting.  Genitourinary: Negative for difficulty urinating, dysuria, flank pain, frequency, hematuria and urgency.  Musculoskeletal: Positive for  arthralgias (primarily L hip). Negative for back pain, joint swelling, myalgias and neck stiffness.  Skin: Negative for pallor and rash.  Neurological: Negative for dizziness, speech difficulty, weakness and headaches.  Hematological: Negative for adenopathy. Does not bruise/bleed easily.  Psychiatric/Behavioral: Negative for confusion and sleep disturbance. The patient is not nervous/anxious.     Past Medical History:  Diagnosis Date  . Allergic rhinitis    with upper airway cough: as of 07/2017 pulm f/u she was instructed to use astelin, flonase, and saline more consistently  . Chest pain, non-cardiac    Cardiac CT showed no signif obstructive dz, mildly elevated calcium level (Dr. [name]).  . Chronic cystitis with hematuria    [name]  . Chronic hypoxemic respiratory failure ([place]) 02/02/2015   2L oxygen 24/7  . COPD (chronic obstructive pulmonary disease) ([facility])    GOLD 4.  spirometry 01/10/09 FEV 0.97(52%), FEV1% 47.  With chronic bronchitis as of 07/2017.  . DDD (degenerative disc disease), cervical    1998 MRI C5-6 impingemt (left)  . DDD (degenerative disc disease), lumbar   . Depression with anxiety 04/15/2011  . Diarrheal disease Summer 2017   ? 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).  As of 10/05/15 pt reports GI dx'd her with C diff and rx'd more flagyl and she is also on cholestyramine.  As of 02/2016, pt's sx's resolved completely.  . Diverticulosis of colon    Procto '97 and colonoscopy 2002  . Dysplastic nevus of upper extremity 04/2014  R tricep ([name])  . Dyspnea    HX OF COPD   . Edema of both lower extremities due to peripheral venous insufficiency    Varicosities bilat, swelling L>R.  R baker's cyst.  . Family history of adverse reaction to anesthesia    mother died when patient age 69 receiving anesthesia for thyroid surgery  . Fibrocystic  breast disease    w/fibroadenoma.  Bx's showed NO atypia (Dr. Margot Chimes).  Mammo neg 2008.  Marland Kitchen GERD (gastroesophageal reflux disease)   . History of double vision    Ophthalmologist, Dr. Herbert Deaner, is further evaluating this with MRI  orbits and limited brain (myesthenia gravis testing neg 07/2010)  . History of home oxygen therapy    uses 2 liters at hs  . Hypertension    EKG 03/2010 normal  . Hypothyroidism    Hashimoto's, dx'd 1989  . Hypoxemia    O2 86% RA with exertion 01/10/09, 2 liters oxygen with exertion and sleep  . Lesion of right native kidney 2017   found on CT 02/2015.  F/u MRI 03/31/15 showed it to be smaller and less likely of any significance but repeat CT renal protocol in 39mo was recommended by radiology.  This was done 10/26/15 and showed no interval change in the lesion, but b/c the lesion enhances with contrast, radiol rec'd urol consult (b/c pap renal RCC could not be excluded).  Saw urol 03/06/16--stable on u/s 09/2016 and 09/2017.  . Osteoarthritis, hip, bilateral    End stage; Hx of R THA.  L THA planned.  Marland Kitchen Postmenopausal atrophic vaginitis    improved with estrace 2 X/week.  . Pulmonary nodule    CT chest 12/10, June 2011.  No nodule on CT 06/2010.  Marland Kitchen Recurrent Clostridium difficile diarrhea    Episode 09/19/2016: resolved with prolonged course of flagyl.  11/2016 recurrence treated with 10d of Dificid (Fidaxomicin) by GI (Dr. Earlean Shawl).  . Recurrent UTI    likely related to pt's atrophic vaginitis.  Urol started vaginal estrace 03/2016.  Marland Kitchen Shingles 02/2014   R abd/flank  . 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 Stuffy and runny nose    REPORTS TODAY 10-08-17 , SAYS THAT SHE'S MADE APPT WITH HER PCP AND WILL CONTACT HER SURGOEN IF PLACED ON ABX    Past Surgical History:  Procedure Laterality Date  . ABDOMINAL HYSTERECTOMY  1978   no BSO per pt--nonmalignant reasons  . APPENDECTOMY    . BREAST CYST EXCISION     Last  screening mammogram 10/2010 was normal.  . CHOLECYSTECTOMY  2001  . COLONOSCOPY  04/2005; 12/06/2015   2007 NORMAL.  2017 adenomatous polyp x 1.  Mod sigmoid diverticulosis (Dr. Earlean Shawl).    Marland Kitchen DEXA  05/18/2017   NORMAL (T-score 0.1)  . HIP CLOSED REDUCTION Right 12/25/2015   Procedure: CLOSED REDUCTION HIP;  Surgeon: Nicholes Stairs, MD;  Location: [facility];  Service: Orthopedics;  Laterality: Right;  . TONSILLECTOMY    . TOTAL HIP ARTHROPLASTY Right 11/01/2014   Procedure: RIGHT TOTAL HIP ARTHROPLASTY;  Surgeon: Latanya Maudlin, MD;  Location: [facility];  Service: Orthopedics;  Laterality: Right;  . TRANSTHORACIC ECHOCARDIOGRAM  08/2014   EF 60-65%, grade I diast dysfxn  . TUBAL LIGATION  1974    Outpatient Medications Prior to Visit  Medication Sig Dispense Refill  . acetaminophen (TYLENOL) 500 MG tablet Take 1,000 mg by mouth 2 (two) times daily as needed  for mild pain, moderate pain, fever or headache.     . albuterol (PROVENTIL HFA;VENTOLIN HFA) 108 (90 Base) MCG/ACT inhaler INHALE 2 PUFFS BY MOUTH EVERY 4 HOURS AS NEEDED (Patient taking differently: Inhale 2 puffs into the lungs every 4 (four) hours as needed for wheezing or shortness of breath. ) 8.5 g 1  . albuterol (PROVENTIL) (2.5 MG/3ML) 0.083% nebulizer solution INHALE 1 VIAL VIA NEBULIZER EVERY 6 HOURS AS NEEDED FOR WHEEZING OR SHORTNESS OF BREATH 75 mL 1  . amLODipine (NORVASC) 5 MG tablet TAKE 1 TABLET BY MOUTH EVERY DAY 90 tablet 1  . Artificial Tear Solution (SOOTHE XP) SOLN Place 1 drop into both eyes 2 (two) times daily as needed (irritation).    Marland Kitchen aspirin EC 81 MG tablet Take 81 mg by mouth daily.    Marland Kitchen azelastine (ASTELIN) 0.1 % nasal spray Place 2 sprays into both nostrils 2 (two) times daily. Use in each nostril as directed (Patient taking differently: Place 2 sprays into both nostrils 2 (two) times daily as needed for rhinitis or allergies. Use in each nostril as directed) 30 mL 3  . diazepam (VALIUM) 5 MG tablet TAKE 1  TABLET BY MOUTH EVERY MORNING AND 2 TABLETS EVERY NIGHT AT BEDTIME (Patient taking differently: Take 5 mg by mouth at bedtime. May take an additional 5 mg dose as needed for anxiety) 90 tablet 5  . diphenhydrAMINE (BENADRYL) 25 MG tablet Take 25 mg by mouth 3 (three) times daily as needed for allergies.    Marland Kitchen estradiol (ESTRACE) 0.1 MG/GM vaginal cream Place 1 Applicatorful vaginally daily as needed (irritation).     . fluticasone (FLONASE) 50 MCG/ACT nasal spray Place 1 spray into both nostrils daily. (Patient taking differently: Place 1 spray into both nostrils daily as needed for allergies or rhinitis. ) 16 g 2  . guaiFENesin (MUCINEX) 600 MG 12 hr tablet Take 600 mg by mouth 2 (two) times daily as needed for to loosen phlegm.    . hydrocortisone cream 1 % Apply 1 application topically daily as needed for itching.    . Lactase (LACTAID FAST ACT) 9000 units TABS Take 9,000 Units by mouth daily as needed (dairy).    Marland Kitchen Lifitegrast (XIIDRA) 5 % SOLN Place 1 drop into both eyes 2 (two) times daily.    . Omega-3 Fatty Acids (OMEGA-3 FISH OIL PO) Take 2,000 mg by mouth daily.    . Probiotic Product (PROBIOTIC DAILY PO) Take 1 capsule by mouth daily.     . roflumilast (DALIRESP) 500 MCG TABS tablet Take 1 tablet (500 mcg total) by mouth daily. 90 tablet 1  . SPIRIVA HANDIHALER 18 MCG inhalation capsule INHALE 1 CAPSULE VIA HANDIHALER EVERY DAY AT THE SAME TIME (Patient taking differently: Place 18 mcg into inhaler and inhale daily. ) 30 capsule 6  . SYNTHROID 88 MCG tablet TAKE 1 TABLET BY MOUTH DAILY 30 tablet 5  . triamcinolone cream (KENALOG) 0.1 % Apply 1 application topically 2 (two) times daily. 30 g 0  . venlafaxine (EFFEXOR) 50 MG tablet TAKE 1 TABLET(50 MG) BY MOUTH TWICE DAILY 180 tablet 3  . WIXELA INHUB 250-50 MCG/DOSE AEPB INHALE 1 PUFF INTO THE LUNGS TWICE DAILY 1 each 5  . doxycycline (VIBRA-TABS) 100 MG tablet Take 1 tablet (100 mg total) by mouth 2 (two) times daily. (Patient not taking:  Reported on 10/28/2017) 20 tablet 0  . predniSONE (DELTASONE) 10 MG tablet 4 tabs for 3 days, then 3 tabs for 3 days, 2  tabs for 3 days, then 1 tab for 3 days, then stop. Take with food in morning. (Patient not taking: Reported on 10/28/2017) 30 tablet 0   No facility-administered medications prior to visit.     Allergies  Allergen Reactions  . Losartan Other (See Comments)    hyperkalemia  . Penicillins Hives, Swelling and Rash    Has patient had a PCN reaction causing immediate rash, facial/tongue/throat swelling, SOB or lightheadedness with hypotension: YES Has patient had a PCN reaction causing severe rash involving mucus membranes or skin necrosis: NO Has patient had a PCN reaction that required hospitalization NO Has patient had a PCN reaction occurring within the last 10 years: NO If all of the above answers are "NO", then may proceed with Cephalosporin use.   . Cefixime [Kdc:Cefixime] Other (See Comments)    unspecified  . Indocin [Indomethacin] Other (See Comments)    Painful tongue and throat  . Sulfa Antibiotics Other (See Comments)    unspecified  . Ace Inhibitors Other (See Comments)    cough  . Statins Other (See Comments)    Myalgias     ROS As per HPI  PE: Blood pressure (!) 141/68, pulse 67, temperature 98.1 F (36.7 C), temperature source Oral, resp. rate 16, height 5' (1.524 m), weight 144 lb 8 oz (65.5 kg), SpO2 95 %. Room air.  Body mass index is 28.22 kg/m.  Gen: Alert, well appearing.  Patient is oriented to person, place, time, and situation. AFFECT: pleasant, lucid thought and speech. ENT: Ears: EACs clear, normal epithelium.  TMs with good light reflex and landmarks bilaterally.  Eyes: no injection, icteris, swelling, or exudate.  EOMI, PERRLA. Nose: no drainage or turbinate edema/swelling.  No injection or focal lesion.  Mouth: lips without lesion/swelling.  Oral mucosa pink and moist. Dentures intact, and without gingival swelling.  Oropharynx  without erythema, exudate, or swelling.  Neck: supple/nontender.  No LAD, mass, or TM.  Carotid pulses 2+ bilaterally, without bruits. CV: RRR, no m/r/g.   LUNGS: CTA bilat, nonlabored resps, good aeration in all lung fields.  Exp phase not prolonged.   ABD: soft, NT, ND, BS normal.  No hepatospenomegaly or mass.  No bruits. EXT: no clubbing, cyanosis, or edema.  Musculoskeletal: no joint swelling, erythema, warmth, or tenderness.  ROM of all joints intact except L hip ER/IR ROM signif limited and PAINFUL.  Flexion/extension L hip with minimal discomfort. Skin - no sores or suspicious lesions or rashes or color changes   LABS:    Chemistry      Component Value Date/Time   NA 146 (H) 10/08/2017 1142   K 3.7 10/08/2017 1142   CL 111 10/08/2017 1142   CO2 27 10/08/2017 1142   BUN 17 10/08/2017 1142   CREATININE 0.94 10/08/2017 1142   CREATININE 0.84 02/27/2016 1533      Component Value Date/Time   CALCIUM 9.3 10/08/2017 1142   ALKPHOS 81 09/02/2017 1057   AST 16 09/02/2017 1057   ALT 12 09/02/2017 1057   BILITOT 0.8 09/02/2017 1057     Lab Results  Component Value Date   TSH 0.79 09/02/2017   Lab Results  Component Value Date   WBC 6.5 10/08/2017   HGB 13.4 10/08/2017   HCT 41.7 10/08/2017   MCV 89.1 10/08/2017   PLT 277 10/08/2017    IMPRESSION AND PLAN:  1) COPD, chronic hypoxic resp failure: recent exacerbation resolved. Continue current preventative inhaler/med regimen, oxygen prn activity and during sleep.  She  will f/u with pulm in 2d and hopefully still be feeling well and get surgical clearance again at that time. I will do medical clearance for her L hip surgery today again.  2) Medical clearance: no hx of CAD or other heart dz.  No uncontrolled systemic dz process occurring. No hx of past problems with anesthesia. Recent CBC, CMET, and TSH all normal.  3) LE venous insufficiency edema: discussed dx. Compression hose rx'd today. Low Na diet. Elevation of  legs discussed. An After Visit Summary was printed and given to the patient.  4) Chronic allergic rhinitis: continue flonase, azelastine, daily nonsedating antihistamine, and will add singulair 10mg  qd today.  FOLLOW UP: Return in about 6 months (around 04/28/2018) for routine chronic illness f/u.  Signed:  Crissie Sickles, MD           10/28/2017   '

## 2017-10-29 NOTE — Progress Notes (Signed)
@Patient  ID: Priscilla Cantrell, female    DOB: 1935-08-24, 82 y.o.   MRN: [id number]  Chief Complaint  Patient presents with  . Follow-up    COPD follow-up    Referring provider: [name], MD  HPI:  82 year old female former smoker followed in our office for GOLD III COPD (based on: Spirometry 05/14/17 >> FEV1 0.91 (55%), FEV1% 48), nocturnal hypoxemia  PMH: GERD, hip pain, osteoarthritis Smoker/ Smoking History: Former smoker.  Quit in 1992.  70 pack years. Maintenance: Spiriva, Wixella, Daliresp  Pt of: Priscilla Cantrell  Recent [place] [facility] Encounters:   10/16/2017  - Visit - BM Patient presenting today currently being treated for COPD exacerbation.  MMRC- 3.  Patient reporting that her hip surgery has been rescheduled. Plan: Continue doxycycline, extend prednisone taper, follow-up in 2 weeks, consider Trelegy Ellipta at next office visit  10/30/2017  - Visit   82 year old female patient presenting today for 2-week follow-up visit from COPD exacerbation.  Patient reports that she is been doing quite well since last office visit.  Patient has completed doxycycline as well as her prednisone taper.  Patient reports she is had no coughing and feels the nasal congestion has significantly improved.  Patient reports she is not having any productive mucus.  She only used her nebulizers 3 times since last office visit.  Patient reports that she has not used her rescue inhaler at all in the last week.  Patient is sleeping okay.  Patient denies indigestion.  For trigger management patient has been doing better with wearing a mask when cleaning and taking breaks during her cleaning episodes.  Patient is already completed follow-up with primary care.  Primary care is added Singulair to her medication regimen.  Primary care thinks the patient is appropriate for hip surgery.  Patient would like to be evaluated for pulmonary concerns today.  Priscilla Cantrell is who we will be performing the hip  surgery.   Tests:  10/12/2017-chest x-ray-stable chronic lung disease no acute findings with bronchitis, allergic rhinitis, nocturnal hypoxemia  Pulmonary tests Spirometry 01/10/09>>FEV1 0.97(52%), FEV1% 47 March 2012>>Daliresp started RAST 05/04/17 >> negative, IgE 8 Spirometry 05/14/17 >> FEV1 0.91 (55%), FEV1% 48  Sleep tests PSG 11/14/11 >> AHI 0.3 ONO with RA 07/31/16 >> test time 6 hrs 41 min.  Average SpO2 87%, low SpO2 74%.  Spent 4 hrs 13 min with SpO2 < 88%.  Cardiac tests Echo 08/30/14 >> EF 60 to 40%, grade 1 diastolic dysfx, PAS 24 mmHG   Chart Review:  10/13/2017-telephone encounter- patient treated with doxycycline and extended prednisone taper, patient follow-up with PCP 2 weeks   Specialty Problems      Pulmonary Problems   Allergic rhinitis    Qualifier: Diagnosis of  By: Priscilla Cantrell        COPD GOLD III B    Spirometry 01/10/09>>FEV1 0.97(52%), FEV1% 47 March 2012>>Daliresp started RAST 05/04/17 >> negative, IgE 8 Spirometry 05/14/17 >> FEV1 0.91 (55%), FEV1% 48       Hypoxia   SOB (shortness of breath)   Chronic hypoxemic respiratory failure (HCC)    ONO with RA 07/31/16 >> test time 6 hrs 41 min.  Average SpO2 87%, low SpO2 74%.  Spent 4 hrs 13 min with SpO2 < 88%.         Allergies  Allergen Reactions  . Losartan Other (See Comments)    hyperkalemia  . Penicillins Hives, Swelling and Rash    Has patient had a PCN  reaction causing immediate rash, facial/tongue/throat swelling, SOB or lightheadedness with hypotension: YES Has patient had a PCN reaction causing severe rash involving mucus membranes or skin necrosis: NO Has patient had a PCN reaction that required hospitalization NO Has patient had a PCN reaction occurring within the last 10 years: NO If all of the above answers are "NO", then may proceed with Cephalosporin use.   . Cefixime [Kdc:Cefixime] Other (See Comments)    unspecified  . Indocin [Indomethacin] Other (See Comments)     Painful tongue and throat  . Sulfa Antibiotics Other (See Comments)    unspecified  . Ace Inhibitors Other (See Comments)    cough  . Statins Other (See Comments)    Myalgias     Immunization History  Administered Date(s) Administered  . Influenza Split 11/04/2010, 10/23/2011  . Influenza Whole 10/04/2009  . Influenza, High Dose Seasonal PF 11/07/2016  . Influenza,inj,Quad PF,6+ Mos 10/07/2012, 10/11/2014  . Influenza-Unspecified 11/07/2013, 11/01/2015  . Pneumococcal Conjugate-13 04/29/2013  . Pneumococcal Polysaccharide-23 02/03/2005, 11/28/2010  . Td 02/04/2008, 03/07/2010  . Zoster 02/10/2013  . Zoster Recombinat (Shingrix) 10/30/2016, 12/29/2016   >>> Patient plans to receive flu vaccine at [facility]  Past Medical History:  Diagnosis Date  . Allergic rhinitis    with upper airway cough: as of 07/2017 pulm f/u she was instructed to use astelin, flonase, and saline more consistently  . Chest pain, non-cardiac    Cardiac CT showed no signif obstructive dz, mildly elevated calcium level (Priscilla Cantrell).  . Chronic cystitis with hematuria    Priscilla Cantrell  . Chronic hypoxemic respiratory failure ([name]) 02/02/2015   2L oxygen 24/7  . COPD (chronic obstructive pulmonary disease) (HCC)    GOLD 4.  spirometry 01/10/09 FEV 0.97(52%), FEV1% 47.  With chronic bronchitis as of 07/2017.  . DDD (degenerative disc disease), cervical    1998 MRI C5-6 impingemt (left)  . DDD (degenerative disc disease), lumbar   . Depression with anxiety 04/15/2011  . Diarrheal disease Summer 2017   ? refractor C diff vs post infectious diarrhea predominant syndrome--GI told her to avoid lactose, sorbitol, and caffeine (08/30/15--Priscilla Cantrell).  As of 10/05/15 pt reports GI dx'd her with C diff and rx'd more flagyl and she is also on cholestyramine.  As of 02/2016, pt's sx's resolved completely.  . Diverticulosis of colon    Procto '97 and colonoscopy 2002  . Dysplastic nevus of upper extremity 04/2014    R tricep (Priscilla Cantrell)  . Dyspnea    HX OF COPD   . Edema of both lower extremities due to peripheral venous insufficiency    Varicosities bilat, swelling L>R.  R baker's cyst.  . Family history of adverse reaction to anesthesia    mother died when patient age 34 receiving anesthesia for thyroid surgery  . Fibrocystic breast disease    w/fibroadenoma.  Bx's showed NO atypia (Dr. Margot Chimes).  Mammo neg 2008.  Marland Kitchen GERD (gastroesophageal reflux disease)   . History of double vision    Ophthalmologist, Dr. Herbert Deaner, is further evaluating this with MRI  orbits and limited brain (myesthenia gravis testing neg 07/2010)  . History of home oxygen therapy    uses 2 liters at hs  . Hypertension    EKG 03/2010 normal  . Hypothyroidism    Hashimoto's, dx'd 1989  . Hypoxemia    O2 86% RA with exertion 01/10/09, 2 liters oxygen with exertion and sleep  . Lesion of right native kidney 2017   found on  CT 02/2015.  F/u MRI 03/31/15 showed it to be smaller and less likely of any significance but repeat CT renal protocol in 53mo was recommended by radiology.  This was done 10/26/15 and showed no interval change in the lesion, but b/c the lesion enhances with contrast, radiol rec'd urol consult (b/c pap renal RCC could not be excluded).  Saw urol 03/06/16--stable on u/s 09/2016 and 09/2017.  . Osteoarthritis, hip, bilateral    End stage; Hx of R THA.  L THA planned.  Marland Kitchen Postmenopausal atrophic vaginitis    improved with estrace 2 X/week.  . Pulmonary nodule    CT chest 12/10, June 2011.  No nodule on CT 06/2010.  Marland Kitchen Recurrent Clostridium difficile diarrhea    Episode 09/19/2016: resolved with prolonged course of flagyl.  11/2016 recurrence treated with 10d of Dificid (Fidaxomicin) by GI (Priscilla Cantrell).  . Recurrent UTI    likely related to pt's atrophic vaginitis.  Urol started vaginal estrace 03/2016.  Marland Kitchen Shingles 02/2014   R abd/flank  . Spinal stenosis of lumbar region with neurogenic claudication    2008 MRI (L3-4, L4-5,  L5-S1)  . Stuffy and runny nose    REPORTS TODAY 10-08-17 , SAYS THAT SHE'S MADE APPT WITH HER PCP AND WILL CONTACT HER SURGOEN IF PLACED ON ABX    Tobacco History: Social History   Tobacco Use  Smoking Status Former Smoker  . Packs/day: 2.00  . Years: 35.00  . Pack years: 70.00  . Types: Cigarettes  . Last attempt to quit: 02/03/1990  . Years since quitting: 27.7  Smokeless Tobacco Never Used   Counseling given: Yes  Continue not smoking  Outpatient Encounter Medications as of 10/30/2017  Medication Sig  . acetaminophen (TYLENOL) 500 MG tablet Take 1,000 mg by mouth 2 (two) times daily as needed for mild pain, moderate pain, fever or headache.   . albuterol (PROVENTIL HFA;VENTOLIN HFA) 108 (90 Base) MCG/ACT inhaler INHALE 2 PUFFS BY MOUTH EVERY 4 HOURS AS NEEDED (Patient taking differently: Inhale 2 puffs into the lungs every 4 (four) hours as needed for wheezing or shortness of breath. )  . albuterol (PROVENTIL) (2.5 MG/3ML) 0.083% nebulizer solution INHALE 1 VIAL VIA NEBULIZER EVERY 6 HOURS AS NEEDED FOR WHEEZING OR SHORTNESS OF BREATH  . amLODipine (NORVASC) 5 MG tablet TAKE 1 TABLET BY MOUTH EVERY DAY  . Artificial Tear Solution (SOOTHE XP) SOLN Place 1 drop into both eyes 2 (two) times daily as needed (irritation).  Marland Kitchen aspirin EC 81 MG tablet Take 81 mg by mouth daily.  Marland Kitchen azelastine (ASTELIN) 0.1 % nasal spray Place 2 sprays into both nostrils 2 (two) times daily. Use in each nostril as directed (Patient taking differently: Place 2 sprays into both nostrils 2 (two) times daily as needed for rhinitis or allergies. Use in each nostril as directed)  . diazepam (VALIUM) 5 MG tablet TAKE 1 TABLET BY MOUTH EVERY MORNING AND 2 TABLETS EVERY NIGHT AT BEDTIME (Patient taking differently: Take 5 mg by mouth at bedtime. May take an additional 5 mg dose as needed for anxiety)  . diphenhydrAMINE (BENADRYL) 25 MG tablet Take 25 mg by mouth 3 (three) times daily as needed for allergies.  Marland Kitchen  estradiol (ESTRACE) 0.1 MG/GM vaginal cream Place 1 Applicatorful vaginally daily as needed (irritation).   . fluticasone (FLONASE) 50 MCG/ACT nasal spray Place 1 spray into both nostrils daily. (Patient taking differently: Place 1 spray into both nostrils daily as needed for allergies or rhinitis. )  .  hydrocortisone cream 1 % Apply 1 application topically daily as needed for itching.  . Lactase (LACTAID FAST ACT) 9000 units TABS Take 9,000 Units by mouth daily as needed (dairy).  Marland Kitchen Lifitegrast (XIIDRA) 5 % SOLN Place 1 drop into both eyes 2 (two) times daily.  . Omega-3 Fatty Acids (OMEGA-3 FISH OIL PO) Take 2,000 mg by mouth daily.  . Probiotic Product (PROBIOTIC DAILY PO) Take 1 capsule by mouth daily.   . roflumilast (DALIRESP) 500 MCG TABS tablet Take 1 tablet (500 mcg total) by mouth daily.  Marland Kitchen SPIRIVA HANDIHALER 18 MCG inhalation capsule INHALE 1 CAPSULE VIA HANDIHALER EVERY DAY AT THE SAME TIME (Patient taking differently: Place 18 mcg into inhaler and inhale daily. )  . SYNTHROID 88 MCG tablet TAKE 1 TABLET BY MOUTH DAILY  . triamcinolone cream (KENALOG) 0.1 % Apply 1 application topically 2 (two) times daily.  Marland Kitchen venlafaxine (EFFEXOR) 50 MG tablet TAKE 1 TABLET(50 MG) BY MOUTH TWICE DAILY  . WIXELA INHUB 250-50 MCG/DOSE AEPB INHALE 1 PUFF INTO THE LUNGS TWICE DAILY  . guaiFENesin (MUCINEX) 600 MG 12 hr tablet Take 600 mg by mouth 2 (two) times daily as needed for to loosen phlegm.  . montelukast (SINGULAIR) 10 MG tablet Take 1 tablet (10 mg total) by mouth at bedtime. (Patient not taking: Reported on 10/30/2017)  . Roflumilast (DALIRESP) 250 MCG TABS Take 1 tablet by mouth daily.  . [DISCONTINUED] SYNTHROID 88 MCG tablet TAKE 1 TABLET BY MOUTH DAILY   No facility-administered encounter medications on file as of 10/30/2017.      Review of Systems  Review of Systems  Constitutional: Positive for fatigue. Negative for chills, fever and unexpected weight change.  HENT: Positive for  congestion (Improved) and voice change (hoarse ). Negative for ear pain, postnasal drip, sinus pressure and sinus pain.   Respiratory: Negative for cough, chest tightness, shortness of breath and wheezing.   Cardiovascular: Negative for chest pain and palpitations.  Gastrointestinal: Negative for blood in stool, diarrhea, nausea and vomiting.  Genitourinary: Negative for dysuria, frequency and urgency.  Musculoskeletal: Positive for gait problem (Walking with cane, preparing for hip surgery). Negative for arthralgias.  Skin: Negative for color change.  Allergic/Immunologic: Negative for environmental allergies and food allergies.  Neurological: Negative for dizziness, light-headedness and headaches.  Psychiatric/Behavioral: Negative for dysphoric mood. The patient is not nervous/anxious.   All other systems reviewed and are negative.    Physical Exam  BP 124/60 (BP Location: Left Arm, Cuff Size: Normal)   Pulse 80   Ht 5' (1.524 m)   Wt 145 lb 12.8 oz (66.1 kg)   SpO2 94%   BMI 28.47 kg/m   Wt Readings from Last 5 Encounters:  10/30/17 145 lb 12.8 oz (66.1 kg)  10/28/17 144 lb 8 oz (65.5 kg)  10/16/17 145 lb 3.2 oz (65.9 kg)  10/12/17 143 lb 4 oz (65 kg)  10/08/17 142 lb (64.4 kg)     Physical Exam  Constitutional: She is oriented to person, place, and time and well-developed, well-nourished, and in no distress. Vital signs are normal. No distress.  HENT:  Head: Normocephalic and atraumatic.  Right Ear: Hearing, tympanic membrane, external ear and ear canal normal.  Left Ear: Hearing, tympanic membrane, external ear and ear canal normal.  Nose: Nose normal. Right sinus exhibits no maxillary sinus tenderness and no frontal sinus tenderness. Left sinus exhibits no maxillary sinus tenderness and no frontal sinus tenderness.  Mouth/Throat: Uvula is midline and oropharynx is clear  and moist. No oropharyngeal exudate.  Eyes: Pupils are equal, round, and reactive to light.  Neck:  Normal range of motion. Neck supple. No JVD present.  Cardiovascular: Normal rate, regular rhythm and normal heart sounds.  Pulmonary/Chest: Effort normal and breath sounds normal. No accessory muscle usage. No respiratory distress. She has no decreased breath sounds. She has no wheezes. She has no rhonchi.  Improved lung exam from previous office visit.  No wheezing inspiratory or expiratory.  Musculoskeletal: Normal range of motion. She exhibits edema (1+ le edema bilaterally).  Lymphadenopathy:    She has no cervical adenopathy.  Neurological: She is alert and oriented to person, place, and time.  Walks with cane  Skin: Skin is warm and dry. She is not diaphoretic. No erythema.  Psychiatric: Mood, memory, affect and judgment normal.  Nursing note and vitals reviewed.     Lab Results:  CBC    Component Value Date/Time   WBC 6.5 10/08/2017 1142   RBC 4.68 10/08/2017 1142   HGB 13.4 10/08/2017 1142   HCT 41.7 10/08/2017 1142   PLT 277 10/08/2017 1142   MCV 89.1 10/08/2017 1142   MCH 28.6 10/08/2017 1142   MCHC 32.1 10/08/2017 1142   RDW 13.4 10/08/2017 1142   LYMPHSABS 2.0 09/02/2017 1057   MONOABS 0.4 09/02/2017 1057   EOSABS 0.1 09/02/2017 1057   BASOSABS 0.0 09/02/2017 1057    BMET    Component Value Date/Time   NA 146 (H) 10/08/2017 1142   K 3.7 10/08/2017 1142   CL 111 10/08/2017 1142   CO2 27 10/08/2017 1142   GLUCOSE 120 (H) 10/08/2017 1142   BUN 17 10/08/2017 1142   CREATININE 0.94 10/08/2017 1142   CREATININE 0.84 02/27/2016 1533   CALCIUM 9.3 10/08/2017 1142   GFRNONAA 56 (L) 10/08/2017 1142   GFRAA >60 10/08/2017 1142    BNP    Component Value Date/Time   BNP 78.4 01/24/2015 1217    ProBNP No results found for: PROBNP  Imaging: Dg Chest 2 View  Result Date: 10/13/2017 CLINICAL DATA:  COPD, cough and congestion. EXAM: CHEST - 2 VIEW COMPARISON:  06/15/2017 FINDINGS: The heart size and mediastinal contours are within normal limits. Stable  chronic lung disease with chronic bronchial and interstitial prominence present throughout both lungs. Stable bibasilar scarring. There is no evidence of pulmonary edema, consolidation, pneumothorax, nodule or pleural fluid. The visualized skeletal structures are unremarkable. IMPRESSION: Stable chronic lung disease.  No acute findings. Electronically Signed   By: Aletta Edouard M.D.   On: 10/13/2017 08:10      Assessment & Plan:   Pleasant 82 year old patient seen for follow-up visit today.  I believe the patient can proceed forward with hip surgery.  I will make sure Dr. Aurea Graff office at [facility] has a copy of our note.  Pulmonary Recommendations:   Major Pulmonary risks identified in the multifactorial risk analysis are but not limited to a) pneumonia; b) recurrent intubation risk; c) prolonged or recurrent acute respiratory failure needing mechanical ventilation; d) prolonged hospitalization; e) DVT/Pulmonary embolism; f) Acute Pulmonary edema  Recommend 1. Short duration of surgery as much as possible and avoid paralytic if possible 2. If requiring inpatient stay: Recovery in step down or ICU with Pulmonary consultation 3. DVT prophylaxis 4. Aggressive pulmonary toilet with o2, bronchodilatation, and incentive spirometry and early ambulation  We will continue with Wixela and Spiriva at this time.  Patient to keep follow-up with our office in December/2019.  Patient received flu  vaccine at [facility].  Patient to follow-up with our office sooner if she starting to have any concerns with her respiratory status.   COPD GOLD III B Continue Wixella >>> Rinse mouth after use  Continue Spiriva  Continue Daliresp  Continue Singulair  Continue daily antihistamine  Note your daily symptoms > remember "red flags" for COPD:   >>>Increase in cough >>>increase in sputum production >>>increase in shortness of breath or activity  intolerance.   If you notice these symptoms, please call  the office to be seen.    Please obtain your flu vaccine from [facility] as we discussed in our office visit >>>You can contact our office and let us know so we can update your shot records   I believe he can proceed forward with hip surgery.  We will make sure that we have our office note sent over to Dr. Aurea Graff office.  Please ensure that you are adherent to your incentive spirometer and flutter valve use in your up ambulating quickly and working regularly with PT.   Please contact our office if you have any complications regarding your surgery  Keep follow-up with Priscilla Cantrell in December/03/2017   Chronic hypoxemic respiratory failure ([place]) Continue oxygen therapy as prescribed Continue to check oxygenation at home Apply oxygen of oxygenation never drops below 90%  Allergic rhinitis Continue Singulair Continue daily antihistamine   This appointment was 27 minutes along with her 50% that time direct face-to-face patient care, assessment, plan of care follow-up.  Lauraine Rinne, NP 10/30/2017

## 2017-10-30 ENCOUNTER — Ambulatory Visit (INDEPENDENT_AMBULATORY_CARE_PROVIDER_SITE_OTHER): Payer: Medicare Other | Admitting: Pulmonary Disease

## 2017-10-30 ENCOUNTER — Encounter: Payer: Self-pay | Admitting: Pulmonary Disease

## 2017-10-30 ENCOUNTER — Other Ambulatory Visit: Payer: Self-pay | Admitting: Family Medicine

## 2017-10-30 DIAGNOSIS — J449 Chronic obstructive pulmonary disease, unspecified: Secondary | ICD-10-CM | POA: Diagnosis not present

## 2017-10-30 DIAGNOSIS — J302 Other seasonal allergic rhinitis: Secondary | ICD-10-CM | POA: Diagnosis not present

## 2017-10-30 DIAGNOSIS — J9611 Chronic respiratory failure with hypoxia: Secondary | ICD-10-CM | POA: Diagnosis not present

## 2017-10-30 MED ORDER — ROFLUMILAST 250 MCG PO TABS: 1.0000 | ORAL_TABLET | Freq: Every day | ORAL | 0 refills | Status: DC

## 2017-10-30 NOTE — Assessment & Plan Note (Signed)
Continue oxygen therapy as prescribed Continue to check oxygenation at home Apply oxygen of oxygenation never drops below 90%

## 2017-10-30 NOTE — Patient Instructions (Addendum)
Continue Wixella >>> Rinse mouth after use  Continue Spiriva  Continue Daliresp  Continue Singulair  Continue daily antihistamine  Note your daily symptoms > remember "red flags" for COPD:   >>>Increase in cough >>>increase in sputum production >>>increase in shortness of breath or activity  intolerance.   If you notice these symptoms, please call the office to be seen.    Please obtain your flu vaccine from [facility] as we discussed in our office visit >>>You can contact our office and let us know so we can update your shot records   I believe he can proceed forward with hip surgery.  We will make sure that we have our office note sent over to Dr. Aurea Graff office.  Please ensure that you are adherent to your incentive spirometer and flutter valve use in your up ambulating quickly and working regularly with PT.   Please contact our office if you have any complications regarding your surgery   Keep follow-up with [name] in December/03/2017      It is flu season:   >>>Remember to be washing your hands regularly, using hand sanitizer, be careful to use around herself with has contact with people who are sick will increase her chances of getting sick yourself. >>> Best ways to protect herself from the flu: Receive the yearly flu vaccine, practice good hand hygiene washing with soap and also using hand sanitizer when available, eat a nutritious meals, get adequate rest, hydrate appropriately    As of 12/07/2017 we will be moving! We will no longer be at our [place] location.   Our new address and phone number will be:  [address] [place], [place] [address] Telephone number: [phone]   Please contact the office if your symptoms worsen or you have concerns that you are not improving.   Thank you for choosing [facility] for your healthcare, and for allowing [place] to partner with you on your healthcare journey. I am thankful to be able to provide care to  you today.   Wyn Quaker FNP-C

## 2017-10-30 NOTE — Assessment & Plan Note (Signed)
Continue Singulair Continue daily antihistamine

## 2017-10-30 NOTE — Assessment & Plan Note (Signed)
Continue Wixella >>> Rinse mouth after use  Continue Spiriva  Continue Daliresp  Continue Singulair  Continue daily antihistamine  Note your daily symptoms > remember "red flags" for COPD:   >>>Increase in cough >>>increase in sputum production >>>increase in shortness of breath or activity  intolerance.   If you notice these symptoms, please call the office to be seen.    Please obtain your flu vaccine from [facility] as we discussed in our office visit >>>You can contact our office and let us know so we can update your shot records   I believe he can proceed forward with hip surgery.  We will make sure that we have our office note sent over to Dr. Aurea Graff office.  Please ensure that you are adherent to your incentive spirometer and flutter valve use in your up ambulating quickly and working regularly with PT.   Please contact our office if you have any complications regarding your surgery  Keep follow-up with [name] in December/03/2017

## 2017-11-03 NOTE — Progress Notes (Signed)
Reviewed and agree with assessment/plan.   Laisha Rau, MD [facility] 01/30/2016, 12:24 PM Pager:  [phone]  

## 2017-11-04 ENCOUNTER — Other Ambulatory Visit: Payer: Self-pay

## 2017-11-04 ENCOUNTER — Encounter: Payer: Self-pay | Admitting: Family Medicine

## 2017-11-04 ENCOUNTER — Ambulatory Visit (INDEPENDENT_AMBULATORY_CARE_PROVIDER_SITE_OTHER): Payer: Medicare Other | Admitting: Family Medicine

## 2017-11-04 VITALS — BP 104/68 | HR 84 | Temp 98.1°F | Resp 14 | Ht 60.0 in | Wt 142.0 lb

## 2017-11-04 DIAGNOSIS — Z23 Encounter for immunization: Secondary | ICD-10-CM | POA: Diagnosis not present

## 2017-11-04 DIAGNOSIS — L720 Epidermal cyst: Secondary | ICD-10-CM

## 2017-11-04 MED ORDER — MUPIROCIN 2 % EX OINT: TOPICAL_OINTMENT | CUTANEOUS | 0 refills | Status: DC

## 2017-11-04 NOTE — Patient Instructions (Signed)
Apply warm compress or warm water-soaked towel to the area for 20 min 1-2 times per day and this will encourage it to drain completely.   Cover with band-aid until it is drying up.  Apply the ointment I rx'd three times per day for 10d

## 2017-11-04 NOTE — Progress Notes (Signed)
OFFICE VISIT  11/04/2017   CC:  Chief Complaint  Patient presents with  . Cyst    noticed on yesterday. Some burning sensation. Has drained. Applied neosporin    HPI:    Patient is a 82 y.o. Caucasian female who presents for a sore/bite on her back. She first noted a burning sensation yesterday on R side of back, saw it in the mirror.  She rubbed her hand over it and it caused a sharp pain. Daughter expressed it like a pimple and some watery substance came out.  It feels fine to pt, has been applying neosporin.  Past Medical History:  Diagnosis Date  . Allergic rhinitis    with upper airway cough: as of 07/2017 pulm f/u she was instructed to use astelin, flonase, and saline more consistently  . Chest pain, non-cardiac    Cardiac CT showed no signif obstructive dz, mildly elevated calcium level (Dr. [name]).  . Chronic cystitis with hematuria    [name]  . Chronic hypoxemic respiratory failure ([facility]) 02/02/2015   2L oxygen 24/7  . COPD (chronic obstructive pulmonary disease) (HCC)    GOLD 4.  spirometry 01/10/09 FEV 0.97(52%), FEV1% 47.  With chronic bronchitis as of 07/2017.  . DDD (degenerative disc disease), cervical    1998 MRI C5-6 impingemt (left)  . DDD (degenerative disc disease), lumbar   . Depression with anxiety 04/15/2011  . Diarrheal disease Summer 2017   ? 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).  As of 10/05/15 pt reports GI dx'd her with C diff and rx'd more flagyl and she is also on cholestyramine.  As of 02/2016, pt's sx's resolved completely.  . Diverticulosis of colon    Procto '97 and colonoscopy 2002  . Dysplastic nevus of upper extremity 04/2014   R tricep ([name])  . Dyspnea    HX OF COPD   . Edema of both lower extremities due to peripheral venous insufficiency    Varicosities bilat, swelling L>R.  R baker's cyst.  . Family history of  adverse reaction to anesthesia    mother died when patient age 54 receiving anesthesia for thyroid surgery  . Fibrocystic breast disease    w/fibroadenoma.  Bx's showed NO atypia (Dr. Margot Chimes).  Mammo neg 2008.  Marland Kitchen GERD (gastroesophageal reflux disease)   . History of double vision    Ophthalmologist, Dr. Herbert Deaner, is further evaluating this with MRI  orbits and limited brain (myesthenia gravis testing neg 07/2010)  . History of home oxygen therapy    uses 2 liters at hs  . Hypertension    EKG 03/2010 normal  . Hypothyroidism    Hashimoto's, dx'd 1989  . Hypoxemia    O2 86% RA with exertion 01/10/09, 2 liters oxygen with exertion and sleep  . Lesion of right native kidney 2017   found on CT 02/2015.  F/u MRI 03/31/15 showed it to be smaller and less likely of any significance but repeat CT renal protocol in 45mo was recommended by radiology.  This was done 10/26/15 and showed no interval change in the lesion, but b/c the lesion enhances with contrast, radiol rec'd urol consult (b/c pap renal RCC could not be excluded).  Saw urol 03/06/16--stable on u/s 09/2016 and 09/2017.  . Osteoarthritis, hip, bilateral    End stage; Hx of R THA.  L THA planned.  Marland Kitchen Postmenopausal atrophic vaginitis    improved with estrace 2  X/week.  . Pulmonary nodule    CT chest 12/10, June 2011.  No nodule on CT 06/2010.  Marland Kitchen Recurrent Clostridium difficile diarrhea    Episode 09/19/2016: resolved with prolonged course of flagyl.  11/2016 recurrence treated with 10d of Dificid (Fidaxomicin) by GI (Dr. Earlean Shawl).  . Recurrent UTI    likely related to pt's atrophic vaginitis.  Urol started vaginal estrace 03/2016.  Marland Kitchen Shingles 02/2014   R abd/flank  . 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 Stuffy and runny nose    REPORTS TODAY 10-08-17 , SAYS THAT SHE'S MADE APPT WITH HER PCP AND WILL CONTACT HER SURGOEN IF PLACED ON ABX    Past Surgical History:  Procedure Laterality Date  .  ABDOMINAL HYSTERECTOMY  1978   no BSO per pt--nonmalignant reasons  . APPENDECTOMY    . BREAST CYST EXCISION     Last screening mammogram 10/2010 was normal.  . CHOLECYSTECTOMY  2001  . COLONOSCOPY  04/2005; 12/06/2015   2007 NORMAL.  2017 adenomatous polyp x 1.  Mod sigmoid diverticulosis (Dr. Earlean Shawl).    Marland Kitchen DEXA  05/18/2017   NORMAL (T-score 0.1)  . HIP CLOSED REDUCTION Right 12/25/2015   Procedure: CLOSED REDUCTION HIP;  Surgeon: Nicholes Stairs, MD;  Location: [facility];  Service: Orthopedics;  Laterality: Right;  . TONSILLECTOMY    . TOTAL HIP ARTHROPLASTY Right 11/01/2014   Procedure: RIGHT TOTAL HIP ARTHROPLASTY;  Surgeon: Latanya Maudlin, MD;  Location: [facility];  Service: Orthopedics;  Laterality: Right;  . TRANSTHORACIC ECHOCARDIOGRAM  08/2014   EF 60-65%, grade I diast dysfxn  . TUBAL LIGATION  1974    Outpatient Medications Prior to Visit  Medication Sig Dispense Refill  . acetaminophen (TYLENOL) 500 MG tablet Take 1,000 mg by mouth 2 (two) times daily as needed for mild pain, moderate pain, fever or headache.     . albuterol (PROVENTIL HFA;VENTOLIN HFA) 108 (90 Base) MCG/ACT inhaler INHALE 2 PUFFS BY MOUTH EVERY 4 HOURS AS NEEDED (Patient taking differently: Inhale 2 puffs into the lungs every 4 (four) hours as needed for wheezing or shortness of breath. ) 8.5 g 1  . albuterol (PROVENTIL) (2.5 MG/3ML) 0.083% nebulizer solution INHALE 1 VIAL VIA NEBULIZER EVERY 6 HOURS AS NEEDED FOR WHEEZING OR SHORTNESS OF BREATH 75 mL 1  . amLODipine (NORVASC) 5 MG tablet TAKE 1 TABLET BY MOUTH EVERY DAY 90 tablet 1  . Artificial Tear Solution (SOOTHE XP) SOLN Place 1 drop into both eyes 2 (two) times daily as needed (irritation).    Marland Kitchen aspirin EC 81 MG tablet Take 81 mg by mouth daily.    Marland Kitchen azelastine (ASTELIN) 0.1 % nasal spray Place 2 sprays into both nostrils 2 (two) times daily. Use in each nostril as directed (Patient taking differently: Place 2 sprays into both nostrils 2 (two) times daily as  needed for rhinitis or allergies. Use in each nostril as directed) 30 mL 3  . diazepam (VALIUM) 5 MG tablet TAKE 1 TABLET BY MOUTH EVERY MORNING AND 2 TABLETS EVERY NIGHT AT BEDTIME (Patient taking differently: Take 5 mg by mouth at bedtime. May take an additional 5 mg dose as needed for anxiety) 90 tablet 5  . diphenhydrAMINE (BENADRYL) 25 MG tablet Take 25 mg by mouth 3 (three) times daily as needed for allergies.    Marland Kitchen estradiol (ESTRACE) 0.1 MG/GM vaginal cream Place 1 Applicatorful vaginally daily as needed (irritation).     Marland Kitchen  fluticasone (FLONASE) 50 MCG/ACT nasal spray Place 1 spray into both nostrils daily. (Patient taking differently: Place 1 spray into both nostrils daily as needed for allergies or rhinitis. ) 16 g 2  . guaiFENesin (MUCINEX) 600 MG 12 hr tablet Take 600 mg by mouth 2 (two) times daily as needed for to loosen phlegm.    . hydrocortisone cream 1 % Apply 1 application topically daily as needed for itching.    . Lactase (LACTAID FAST ACT) 9000 units TABS Take 9,000 Units by mouth daily as needed (dairy).    Marland Kitchen Lifitegrast (XIIDRA) 5 % SOLN Place 1 drop into both eyes 2 (two) times daily.    . montelukast (SINGULAIR) 10 MG tablet Take 1 tablet (10 mg total) by mouth at bedtime. 30 tablet 6  . Omega-3 Fatty Acids (OMEGA-3 FISH OIL PO) Take 2,000 mg by mouth daily.    . Probiotic Product (PROBIOTIC DAILY PO) Take 1 capsule by mouth daily.     . Roflumilast (DALIRESP) 250 MCG TABS Take 1 tablet by mouth daily. 28 tablet 0  . roflumilast (DALIRESP) 500 MCG TABS tablet Take 1 tablet (500 mcg total) by mouth daily. 90 tablet 1  . SPIRIVA HANDIHALER 18 MCG inhalation capsule INHALE 1 CAPSULE VIA HANDIHALER EVERY DAY AT THE SAME TIME (Patient taking differently: Place 18 mcg into inhaler and inhale daily. ) 30 capsule 6  . SYNTHROID 88 MCG tablet TAKE 1 TABLET BY MOUTH DAILY 30 tablet 0  . triamcinolone cream (KENALOG) 0.1 % Apply 1 application topically 2 (two) times daily. 30 g 0  .  venlafaxine (EFFEXOR) 50 MG tablet TAKE 1 TABLET(50 MG) BY MOUTH TWICE DAILY 180 tablet 3  . WIXELA INHUB 250-50 MCG/DOSE AEPB INHALE 1 PUFF INTO THE LUNGS TWICE DAILY 1 each 5   No facility-administered medications prior to visit.     Allergies  Allergen Reactions  . Losartan Other (See Comments)    hyperkalemia  . Penicillins Hives, Swelling and Rash    Has patient had a PCN reaction causing immediate rash, facial/tongue/throat swelling, SOB or lightheadedness with hypotension: YES Has patient had a PCN reaction causing severe rash involving mucus membranes or skin necrosis: NO Has patient had a PCN reaction that required hospitalization NO Has patient had a PCN reaction occurring within the last 10 years: NO If all of the above answers are "NO", then may proceed with Cephalosporin use.   . Cefixime [Kdc:Cefixime] Other (See Comments)    unspecified  . Indocin [Indomethacin] Other (See Comments)    Painful tongue and throat  . Sulfa Antibiotics Other (See Comments)    unspecified  . Ace Inhibitors Other (See Comments)    cough  . Statins Other (See Comments)    Myalgias     ROS As per HPI  PE: Blood pressure 104/68, pulse 84, temperature 98.1 F (36.7 C), temperature source Oral, resp. rate 14, height 5' (1.524 m), weight 142 lb (64.4 kg), SpO2 95 %. Gen: Alert, well appearing.  Patient is oriented to person, place, time, and situation. Skin: R mid back in bra line--quarter sized erythematous macule with small papule in center.  Minimal, if any, fluctuance felt. I could not express any fluid from this lesion.    LABS:    Chemistry      Component Value Date/Time   NA 146 (H) 10/08/2017 1142   K 3.7 10/08/2017 1142   CL 111 10/08/2017 1142   CO2 27 10/08/2017 1142   BUN 17 10/08/2017  1142   CREATININE 0.94 10/08/2017 1142   CREATININE 0.84 02/27/2016 1533      Component Value Date/Time   CALCIUM 9.3 10/08/2017 1142   ALKPHOS 81 09/02/2017 1057   AST 16  09/02/2017 1057   ALT 12 09/02/2017 1057   BILITOT 0.8 09/02/2017 1057     Lab Results  Component Value Date   WBC 6.5 10/08/2017   HGB 13.4 10/08/2017   HCT 41.7 10/08/2017   MCV 89.1 10/08/2017   PLT 277 10/08/2017    IMPRESSION AND PLAN:  Small epidermal inclusion cyst, partially drained.   Instructions: Apply warm compress or warm water-soaked towel to the area for 20 min 1-2 times per day and this will encourage it to drain completely.   Cover with band-aid until it is drying up. Apply the bactroban ointment I rx'd three times per day for 10d   An After Visit Summary was printed and given to the patient.  FOLLOW UP: Return if symptoms worsen or fail to improve.  Signed:  Crissie Sickles, MD           11/04/2017

## 2017-11-10 ENCOUNTER — Telehealth: Payer: Self-pay | Admitting: Family Medicine

## 2017-11-10 NOTE — Telephone Encounter (Signed)
Copied from [facility] [id number]. Topic: General - Other >> Nov 10, 2017 12:43 PM Marin Olp L wrote: Reason for CRM: Patient calling to report that she wants montelukast (SINGULAIR) 10 MG tablet removed from her medication list and is not interest in taking it. She feels worse when on this medication.

## 2017-11-10 NOTE — Telephone Encounter (Signed)
FYI. I will remove this medication as pt requested.

## 2017-11-11 NOTE — Telephone Encounter (Signed)
OK, done

## 2017-11-13 DIAGNOSIS — Z961 Presence of intraocular lens: Secondary | ICD-10-CM | POA: Diagnosis not present

## 2017-11-13 DIAGNOSIS — H04123 Dry eye syndrome of bilateral lacrimal glands: Secondary | ICD-10-CM | POA: Diagnosis not present

## 2017-11-13 DIAGNOSIS — H35373 Puckering of macula, bilateral: Secondary | ICD-10-CM | POA: Diagnosis not present

## 2017-11-13 DIAGNOSIS — H35363 Drusen (degenerative) of macula, bilateral: Secondary | ICD-10-CM | POA: Diagnosis not present

## 2017-11-26 ENCOUNTER — Other Ambulatory Visit: Payer: Self-pay | Admitting: Family Medicine

## 2017-11-30 NOTE — H&P (Signed)
TOTAL HIP ADMISSION H&P  Patient is admitted for left total hip arthroplasty.  Subjective:  Chief Complaint:  Left hip primary OA / pain  HPI: Priscilla Cantrell, 82 y.o. female, has a history of pain and functional disability in the left hip(s) due to arthritis and patient has failed non-surgical conservative treatments for greater than 12 weeks to include NSAID's and/or analgesics, use of assistive devices and activity modification.  Onset of symptoms was gradual starting 3-4 years ago with gradually worsening course since that time.The patient noted prior procedures of the hip to include arthroplasty on the right hip by Dr. Gladstone Lighter.  Patient currently rates pain in the left hip at 9 out of 10 with activity. Patient has night pain, worsening of pain with activity and weight bearing, trendelenberg gait, pain that interfers with activities of daily living and pain with passive range of motion. Patient has evidence of periarticular osteophytes and joint space narrowing by imaging studies. This condition presents safety issues increasing the risk of falls.  There is no current active infection.  Risks, benefits and expectations were discussed with the patient.  Risks including but not limited to the risk of anesthesia, blood clots, nerve damage, blood vessel damage, failure of the prosthesis, infection and up to and including death.  Patient understand the risks, benefits and expectations and wishes to proceed with surgery.   PCP: [name], MD  D/C Plans:       Home  Post-op Meds:       No Rx given  Tranexamic Acid:      To be given - IV   Decadron:      Is to be given  FYI:      ASA  Norco  DME:   Pt already has equipment   PT:   No PT    Patient Active Problem List   Diagnosis Date Noted  . Postherpetic neuralgia 03/07/2015  . Constipation 02/28/2015  . Renal mass 02/28/2015  . Acute pyelonephritis 02/20/2015  . Oral thrush 02/08/2015  . Chronic hypoxemic respiratory failure  ([place]) 02/02/2015  . SOB (shortness of breath)   . Essential hypertension 01/24/2015  . UTI (urinary tract infection) 01/09/2015  . Pain and swelling of right lower leg 01/09/2015  . H/O total hip arthroplasty 11/01/2014  . Spinal stenosis, lumbar region, with neurogenic claudication 01/04/2014  . Osteoarthritis, hip, bilateral 07/27/2013  . Sciatica associated with disorder of lumbosacral spine 04/19/2013  . Atrophic vaginitis 01/12/2012  . Periodic limb movement disorder 11/24/2011  . Hypoxia 05/22/2011  . Depression with anxiety 04/15/2011  . Chronic venous insufficiency 07/29/2010  . Hypothyroidism due to Hashimoto's thyroiditis 01/07/2010  . SPINAL STENOSIS 01/07/2010  . Allergic rhinitis 08/09/2007  . COPD GOLD III B 08/09/2007   Past Medical History:  Diagnosis Date  . Allergic rhinitis    with upper airway cough: as of 07/2017 pulm f/u she was instructed to use astelin, flonase, and saline more consistently  . Chest pain, non-cardiac    Cardiac CT showed no signif obstructive dz, mildly elevated calcium level (Dr. [name]).  . Chronic cystitis with hematuria    [name]  . Chronic hypoxemic respiratory failure ([place]) 02/02/2015   2L oxygen 24/7  . COPD (chronic obstructive pulmonary disease) ([facility])    GOLD 4.  spirometry 01/10/09 FEV 0.97(52%), FEV1% 47.  With chronic bronchitis as of 07/2017.  . DDD (degenerative disc disease), cervical    1998 MRI C5-6 impingemt (left)  . DDD (degenerative disc  disease), lumbar   . Depression with anxiety 04/15/2011  . Diarrheal disease Summer 2017   ? 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).  As of 10/05/15 pt reports GI dx'd her with C diff and rx'd more flagyl and she is also on cholestyramine.  As of 02/2016, pt's sx's resolved completely.  . Diverticulosis of colon    Procto '97 and colonoscopy 2002  . Dysplastic nevus of  upper extremity 04/2014   R tricep ([name])  . Dyspnea    HX OF COPD   . Edema of both lower extremities due to peripheral venous insufficiency    Varicosities bilat, swelling L>R.  R baker's cyst.  . Family history of adverse reaction to anesthesia    mother died when patient age 79 receiving anesthesia for thyroid surgery  . Fibrocystic breast disease    w/fibroadenoma.  Bx's showed NO atypia (Dr. Margot Chimes).  Mammo neg 2008.  Marland Kitchen GERD (gastroesophageal reflux disease)   . History of double vision    Ophthalmologist, Dr. Herbert Deaner, is further evaluating this with MRI  orbits and limited brain (myesthenia gravis testing neg 07/2010)  . History of home oxygen therapy    uses 2 liters at hs  . Hypertension    EKG 03/2010 normal  . Hypothyroidism    Hashimoto's, dx'd 1989  . Hypoxemia    O2 86% RA with exertion 01/10/09, 2 liters oxygen with exertion and sleep  . Lesion of right native kidney 2017   found on CT 02/2015.  F/u MRI 03/31/15 showed it to be smaller and less likely of any significance but repeat CT renal protocol in 26mo was recommended by radiology.  This was done 10/26/15 and showed no interval change in the lesion, but b/c the lesion enhances with contrast, radiol rec'd urol consult (b/c pap renal RCC could not be excluded).  Saw urol 03/06/16--stable on u/s 09/2016 and 09/2017.  . Osteoarthritis, hip, bilateral    End stage; Hx of R THA.  L THA planned.  Marland Kitchen Postmenopausal atrophic vaginitis    improved with estrace 2 X/week.  . Pulmonary nodule    CT chest 12/10, June 2011.  No nodule on CT 06/2010.  Marland Kitchen Recurrent Clostridium difficile diarrhea    Episode 09/19/2016: resolved with prolonged course of flagyl.  11/2016 recurrence treated with 10d of Dificid (Fidaxomicin) by GI (Dr. Earlean Shawl).  . Recurrent UTI    likely related to pt's atrophic vaginitis.  Urol started vaginal estrace 03/2016.  Marland Kitchen Shingles 02/2014   R abd/flank  . Spinal stenosis of lumbar region with neurogenic claudication     2008 MRI (L3-4, L4-5, L5-S1)  . Stuffy and runny nose    REPORTS TODAY 10-08-17 , SAYS THAT SHE'S MADE APPT WITH HER PCP AND WILL CONTACT HER SURGOEN IF PLACED ON ABX    Past Surgical History:  Procedure Laterality Date  . ABDOMINAL HYSTERECTOMY  1978   no BSO per pt--nonmalignant reasons  . APPENDECTOMY    . BREAST CYST EXCISION     Last screening mammogram 10/2010 was normal.  . CHOLECYSTECTOMY  2001  . COLONOSCOPY  04/2005; 12/06/2015   2007 NORMAL.  2017 adenomatous polyp x 1.  Mod sigmoid diverticulosis (Dr. Earlean Shawl).    Marland Kitchen DEXA  05/18/2017   NORMAL (T-score 0.1)  . HIP CLOSED REDUCTION Right 12/25/2015   Procedure: CLOSED REDUCTION HIP;  Surgeon: Nicholes Stairs, MD;  Location: [facility];  Service: Orthopedics;  Laterality: Right;  . TONSILLECTOMY    . TOTAL HIP ARTHROPLASTY Right 11/01/2014   Procedure: RIGHT TOTAL HIP ARTHROPLASTY;  Surgeon: Latanya Maudlin, MD;  Location: [facility];  Service: Orthopedics;  Laterality: Right;  . TRANSTHORACIC ECHOCARDIOGRAM  08/2014   EF 60-65%, grade I diast dysfxn  . TUBAL LIGATION  1974    No current facility-administered medications for this encounter.    Current Outpatient Medications  Medication Sig Dispense Refill Last Dose  . acetaminophen (TYLENOL) 500 MG tablet Take 1,000 mg by mouth 2 (two) times daily as needed for mild pain, moderate pain, fever or headache.    Taking  . albuterol (PROVENTIL HFA;VENTOLIN HFA) 108 (90 Base) MCG/ACT inhaler INHALE 2 PUFFS BY MOUTH EVERY 4 HOURS AS NEEDED (Patient taking differently: Inhale 2 puffs into the lungs every 4 (four) hours as needed for wheezing or shortness of breath. ) 8.5 g 1 Taking  . albuterol (PROVENTIL) (2.5 MG/3ML) 0.083% nebulizer solution INHALE 1 VIAL VIA NEBULIZER EVERY 6 HOURS AS NEEDED FOR WHEEZING OR SHORTNESS OF BREATH 75 mL 1 Taking  . amLODipine (NORVASC) 5 MG tablet TAKE 1 TABLET BY MOUTH EVERY DAY 90 tablet 1 Taking  . Artificial Tear Solution (SOOTHE XP) SOLN Place 1 drop into  both eyes 2 (two) times daily as needed (irritation).   Taking  . aspirin EC 81 MG tablet Take 81 mg by mouth daily.   Taking  . azelastine (ASTELIN) 0.1 % nasal spray Place 2 sprays into both nostrils 2 (two) times daily. Use in each nostril as directed (Patient taking differently: Place 2 sprays into both nostrils 2 (two) times daily as needed for rhinitis or allergies. Use in each nostril as directed) 30 mL 3 Taking  . diazepam (VALIUM) 5 MG tablet TAKE 1 TABLET BY MOUTH EVERY MORNING AND 2 TABLETS EVERY NIGHT AT BEDTIME (Patient taking differently: Take 5 mg by mouth at bedtime. May take an additional 5 mg dose as needed for anxiety) 90 tablet 5 Taking  . diphenhydrAMINE (BENADRYL) 25 MG tablet Take 25 mg by mouth 3 (three) times daily as needed for allergies.   Taking  . estradiol (ESTRACE) 0.1 MG/GM vaginal cream Place 1 Applicatorful vaginally daily as needed (irritation).    Taking  . fluticasone (FLONASE) 50 MCG/ACT nasal spray Place 1 spray into both nostrils daily. (Patient taking differently: Place 1 spray into both nostrils daily as needed for allergies or rhinitis. ) 16 g 2 Taking  . guaiFENesin (MUCINEX) 600 MG 12 hr tablet Take 600 mg by mouth 2 (two) times daily as needed for to loosen phlegm.   Taking  . hydrocortisone cream 1 % Apply 1 application topically daily as needed for itching.   Taking  . Lactase (LACTAID FAST ACT) 9000 units TABS Take 9,000 Units by mouth daily as needed (dairy).   Taking  . Lifitegrast (XIIDRA) 5 % SOLN Place 1 drop into both eyes 2 (two) times daily.   Taking  . Omega-3 Fatty Acids (OMEGA-3 FISH OIL PO) Take 2,000 mg by mouth daily.   Taking  . Probiotic Product (PROBIOTIC DAILY PO) Take 1 capsule by mouth daily.    Taking  . Roflumilast (DALIRESP) 250 MCG TABS Take 1 tablet by mouth daily. 28 tablet 0 Taking  . roflumilast (DALIRESP) 500 MCG TABS tablet Take 1 tablet (500 mcg total) by mouth daily. 90 tablet 1 Taking  . SPIRIVA HANDIHALER 18 MCG  inhalation capsule INHALE 1 CAPSULE VIA HANDIHALER EVERY DAY  AT THE SAME TIME (Patient taking differently: Place 18 mcg into inhaler and inhale daily. ) 30 capsule 6 Taking  . SYNTHROID 88 MCG tablet TAKE 1 TABLET BY MOUTH DAILY 30 tablet 5   . triamcinolone cream (KENALOG) 0.1 % Apply 1 application topically 2 (two) times daily. (Patient taking differently: Apply 1 application topically 2 (two) times daily as needed (irritation). ) 30 g 0 Taking  . venlafaxine (EFFEXOR) 50 MG tablet TAKE 1 TABLET(50 MG) BY MOUTH TWICE DAILY 180 tablet 3 Taking  . WIXELA INHUB 250-50 MCG/DOSE AEPB INHALE 1 PUFF INTO THE LUNGS TWICE DAILY (Patient taking differently: Inhale 1 puff into the lungs 2 (two) times daily. ) 1 each 5 Taking   Allergies  Allergen Reactions  . Losartan Other (See Comments)    hyperkalemia  . Penicillins Hives, Swelling and Rash    Has patient had a PCN reaction causing immediate rash, facial/tongue/throat swelling, SOB or lightheadedness with hypotension: YES Has patient had a PCN reaction causing severe rash involving mucus membranes or skin necrosis: NO Has patient had a PCN reaction that required hospitalization NO Has patient had a PCN reaction occurring within the last 10 years: NO If all of the above answers are "NO", then may proceed with Cephalosporin use.   . Cefixime [Kdc:Cefixime] Other (See Comments)    unspecified  . Indocin [Indomethacin] Other (See Comments)    Painful tongue and throat  . Sulfa Antibiotics Other (See Comments)    unspecified  . Ace Inhibitors Other (See Comments)    cough  . Statins Other (See Comments)    Myalgias     Social History   Tobacco Use  . Smoking status: Former Smoker    Packs/day: 2.00    Years: 35.00    Pack years: 70.00    Types: Cigarettes    Last attempt to quit: 02/03/1990    Years since quitting: 27.8  . Smokeless tobacco: Never Used  Substance Use Topics  . Alcohol use: Yes    Comment: Rarely    Family History   Problem Relation Age of Onset  . Goiter Mother   . Psoriasis Father        od liver  . Cirrhosis Father   . Diabetes Daughter        Type 2  . Cancer Paternal Aunt        stomach?  . Stroke Maternal Grandfather   . Stroke Paternal Grandmother   . Hypertension Paternal Grandmother   . Hernia Paternal Grandmother   . Cancer Paternal Grandfather        lung     Review of Systems  Constitutional: Negative.   HENT: Negative.   Eyes: Negative.   Respiratory: Positive for shortness of breath (with exertion).   Cardiovascular: Negative.   Gastrointestinal: Negative.   Genitourinary: Negative.   Musculoskeletal: Positive for back pain and joint pain.  Skin: Negative.   Neurological: Negative.   Endo/Heme/Allergies: Negative.   Psychiatric/Behavioral: Positive for depression. The patient is nervous/anxious.     Objective:  Physical Exam  Constitutional: She is oriented to person, place, and time. She appears well-developed.  HENT:  Head: Normocephalic.  Eyes: Pupils are equal, round, and reactive to light.  Neck: Neck supple. No JVD present. No tracheal deviation present. No thyromegaly present.  Cardiovascular: Normal rate, regular rhythm and intact distal pulses.  Respiratory: Effort normal and breath sounds normal. No respiratory distress. She has no wheezes.  GI: Soft. There is no tenderness. There  is no guarding.  Musculoskeletal:       Left hip: She exhibits decreased range of motion, decreased strength, tenderness and bony tenderness. She exhibits no swelling, no deformity and no laceration.  Lymphadenopathy:    She has no cervical adenopathy.  Neurological: She is alert and oriented to person, place, and time. A sensory deficit (tingling bilateral feet) is present.  Skin: Skin is warm and dry.  Psychiatric: She has a normal mood and affect.      Labs:  Estimated body mass index is 27.73 kg/m as calculated from the following:   Height as of 11/04/17: 5' (1.524  m).   Weight as of 11/04/17: 64.4 kg.   Imaging Review Plain radiographs demonstrate severe degenerative joint disease of the left hip. The bone quality appears to be good for age and reported activity level.    Preoperative templating of the joint replacement has been completed, documented, and submitted to the Operating Room personnel in order to optimize intra-operative equipment management.     Assessment/Plan:  End stage arthritis, left hip  The patient history, physical examination, clinical judgement of the provider and imaging studies are consistent with end stage degenerative joint disease of the left hip and total hip arthroplasty is deemed medically necessary. The treatment options including medical management, injection therapy, arthroscopy and arthroplasty were discussed at length. The risks and benefits of total hip arthroplasty were presented and reviewed. The risks due to aseptic loosening, infection, stiffness, dislocation/subluxation,  thromboembolic complications and other imponderables were discussed.  The patient acknowledged the explanation, agreed to proceed with the plan and consent was signed. Patient is being admitted for inpatient treatment for surgery, pain control, PT, OT, prophylactic antibiotics, VTE prophylaxis, progressive ambulation and ADL's and discharge planning.The patient is planning to be discharged home.     [place] Kemani Demarais   PA-C  11/30/2017, 9:18 AM

## 2017-12-01 NOTE — Progress Notes (Signed)
ekg 05-12-17 epic  cxr 10-13-17 epic

## 2017-12-01 NOTE — Patient Instructions (Signed)
TEGHAN PHILBIN  12/01/2017   Your procedure is scheduled on: 12-08-17  Report to [facility] Main  Entrance  Report to admitting at      0900 AM    Call this number if you have problems the morning of surgery [phone]    Remember: Do not eat food or drink liquids :After Midnight. BRUSH YOUR TEETH MORNING OF SURGERY AND RINSE YOUR MOUTH OUT, NO CHEWING GUM CANDY OR MINTS.     Take these medicines the morning of surgery with A SIP OF WATER: inhalers and bring, effexor, synthroid, roflumilast, eyedrops as usual, amlodipine                                You may not have any metal on your body including hair pins and              piercings  Do not wear jewelry, make-up, lotions, powders or perfumes, deodorant             Do not wear nail polish.  Do not shave  48 hours prior to surgery.              Do not bring valuables to the hospital. [place].  Contacts, dentures or bridgework may not be worn into surgery.  Leave suitcase in the car. After surgery it may be brought to your room.                 Please read over the following fact sheets you were given: _____________________________________________________________________           [facility] - Preparing for Surgery Before surgery, you can play an important role.  Because skin is not sterile, your skin needs to be as free of germs as possible.  You can reduce the number of germs on your skin by washing with CHG (chlorahexidine gluconate) soap before surgery.  CHG is an antiseptic cleaner which kills germs and bonds with the skin to continue killing germs even after washing. Please DO NOT use if you have an allergy to CHG or antibacterial soaps.  If your skin becomes reddened/irritated stop using the CHG and inform your nurse when you arrive at Short Stay. Do not shave (including legs and underarms) for at least 48 hours prior to the first CHG shower.   You may shave your face/neck. Please follow these instructions carefully:  1.  Shower with CHG Soap the night before surgery and the  morning of Surgery.  2.  If you choose to wash your hair, wash your hair first as usual with your  normal  shampoo.  3.  After you shampoo, rinse your hair and body thoroughly to remove the  shampoo.                           4.  Use CHG as you would any other liquid soap.  You can apply chg directly  to the skin and wash                       Gently with a scrungie or clean washcloth.  5.  Apply the CHG Soap to your body ONLY  FROM THE NECK DOWN.   Do not use on face/ open                           Wound or open sores. Avoid contact with eyes, ears mouth and genitals (private parts).                       Wash face,  Genitals (private parts) with your normal soap.             6.  Wash thoroughly, paying special attention to the area where your surgery  will be performed.  7.  Thoroughly rinse your body with warm water from the neck down.  8.  DO NOT shower/wash with your normal soap after using and rinsing off  the CHG Soap.                9.  Pat yourself dry with a clean towel.            10.  Wear clean pajamas.            11.  Place clean sheets on your bed the night of your first shower and do not  sleep with pets. Day of Surgery : Do not apply any lotions/deodorants the morning of surgery.  Please wear clean clothes to the hospital/surgery center.  FAILURE TO FOLLOW THESE INSTRUCTIONS MAY RESULT IN THE CANCELLATION OF YOUR SURGERY PATIENT SIGNATURE_________________________________  NURSE SIGNATURE__________________________________  ________________________________________________________________________  WHAT IS A BLOOD TRANSFUSION? Blood Transfusion Information  A transfusion is the replacement of blood or some of its parts. Blood is made up of multiple cells which provide different functions.  Red blood cells carry oxygen and are used for blood  loss replacement.  White blood cells fight against infection.  Platelets control bleeding.  Plasma helps clot blood.  Other blood products are available for specialized needs, such as hemophilia or other clotting disorders. BEFORE THE TRANSFUSION  Who gives blood for transfusions?   Healthy volunteers who are fully evaluated to make sure their blood is safe. This is blood bank blood. Transfusion therapy is the safest it has ever been in the practice of medicine. Before blood is taken from a donor, a complete history is taken to make sure that person has no history of diseases nor engages in risky social behavior (examples are intravenous drug use or sexual activity with multiple partners). The donor's travel history is screened to minimize risk of transmitting infections, such as malaria. The donated blood is tested for signs of infectious diseases, such as HIV and hepatitis. The blood is then tested to be sure it is compatible with you in order to minimize the chance of a transfusion reaction. If you or a relative donates blood, this is often done in anticipation of surgery and is not appropriate for emergency situations. It takes many days to process the donated blood. RISKS AND COMPLICATIONS Although transfusion therapy is very safe and saves many lives, the main dangers of transfusion include:   Getting an infectious disease.  Developing a transfusion reaction. This is an allergic reaction to something in the blood you were given. Every precaution is taken to prevent this. The decision to have a blood transfusion has been considered carefully by your caregiver before blood is given. Blood is not given unless the benefits outweigh the risks. AFTER THE TRANSFUSION  Right after receiving a blood transfusion, you will usually feel much better  and more energetic. This is especially true if your red blood cells have gotten low (anemic). The transfusion raises the level of the red blood cells  which carry oxygen, and this usually causes an energy increase.  The nurse administering the transfusion will monitor you carefully for complications. HOME CARE INSTRUCTIONS  No special instructions are needed after a transfusion. You may find your energy is better. Speak with your caregiver about any limitations on activity for underlying diseases you may have. SEEK MEDICAL CARE IF:   Your condition is not improving after your transfusion.  You develop redness or irritation at the intravenous (IV) site. SEEK IMMEDIATE MEDICAL CARE IF:  Any of the following symptoms occur over the next 12 hours:  Shaking chills.  You have a temperature by mouth above 102 F (38.9 C), not controlled by medicine.  Chest, back, or muscle pain.  People around you feel you are not acting correctly or are confused.  Shortness of breath or difficulty breathing.  Dizziness and fainting.  You get a rash or develop hives.  You have a decrease in urine output.  Your urine turns a dark color or changes to pink, red, or brown. Any of the following symptoms occur over the next 10 days:  You have a temperature by mouth above 102 F (38.9 C), not controlled by medicine.  Shortness of breath.  Weakness after normal activity.  The white part of the eye turns yellow (jaundice).  You have a decrease in the amount of urine or are urinating less often.  Your urine turns a dark color or changes to pink, red, or brown. Document Released: 01/18/2000 Document Revised: 04/14/2011 Document Reviewed: 09/06/2007 Exit[employer] Patient Information 2014 [place].  _______________________________________________________________________  Incentive Spirometer  An incentive spirometer is a tool that can help keep your lungs clear and active. This tool measures how well you are filling your lungs with each breath. Taking long deep breaths may help reverse or decrease the chance of developing breathing (pulmonary)  problems (especially infection) following:  A long period of time when you are unable to move or be active. BEFORE THE PROCEDURE   If the spirometer includes an indicator to show your best effort, your nurse or respiratory therapist will set it to a desired goal.  If possible, sit up straight or lean slightly forward. Try not to slouch.  Hold the incentive spirometer in an upright position. INSTRUCTIONS FOR USE  1. Sit on the edge of your bed if possible, or sit up as far as you can in bed or on a chair. 2. Hold the incentive spirometer in an upright position. 3. Breathe out normally. 4. Place the mouthpiece in your mouth and seal your lips tightly around it. 5. Breathe in slowly and as deeply as possible, raising the piston or the ball toward the top of the column. 6. Hold your breath for 3-5 seconds or for as long as possible. Allow the piston or ball to fall to the bottom of the column. 7. Remove the mouthpiece from your mouth and breathe out normally. 8. Rest for a few seconds and repeat Steps 1 through 7 at least 10 times every 1-2 hours when you are awake. Take your time and take a few normal breaths between deep breaths. 9. The spirometer may include an indicator to show your best effort. Use the indicator as a goal to work toward during each repetition. 10. After each set of 10 deep breaths, practice coughing to be sure your  lungs are clear. If you have an incision (the cut made at the time of surgery), support your incision when coughing by placing a pillow or rolled up towels firmly against it. Once you are able to get out of bed, walk around indoors and cough well. You may stop using the incentive spirometer when instructed by your caregiver.  RISKS AND COMPLICATIONS  Take your time so you do not get dizzy or light-headed.  If you are in pain, you may need to take or ask for pain medication before doing incentive spirometry. It is harder to take a deep breath if you are having  pain. AFTER USE  Rest and breathe slowly and easily.  It can be helpful to keep track of a log of your progress. Your caregiver can provide you with a simple table to help with this. If you are using the spirometer at home, follow these instructions: [place] IF:   You are having difficultly using the spirometer.  You have trouble using the spirometer as often as instructed.  Your pain medication is not giving enough relief while using the spirometer.  You develop fever of 100.5 F (38.1 C) or higher. SEEK IMMEDIATE MEDICAL CARE IF:   You cough up bloody sputum that had not been present before.  You develop fever of 102 F (38.9 C) or greater.  You develop worsening pain at or near the incision site. MAKE SURE YOU:   Understand these instructions.  Will watch your condition.  Will get help right away if you are not doing well or get worse. Document Released: 06/02/2006 Document Revised: 04/14/2011 Document Reviewed: 08/03/2006 [facility] Patient Information 2014 [place], [place].   ________________________________________________________________________

## 2017-12-02 ENCOUNTER — Other Ambulatory Visit: Payer: Self-pay

## 2017-12-02 ENCOUNTER — Encounter (HOSPITAL_COMMUNITY): Payer: Self-pay

## 2017-12-02 ENCOUNTER — Encounter (HOSPITAL_COMMUNITY)
Admission: RE | Admit: 2017-12-02 | Discharge: 2017-12-02 | Disposition: A | Discharge status: Home / Self Care | Payer: Medicare Other | Source: Ambulatory Visit | Attending: Orthopedic Surgery | Admitting: Orthopedic Surgery

## 2017-12-02 DIAGNOSIS — Z01812 Encounter for preprocedural laboratory examination: Secondary | ICD-10-CM | POA: Insufficient documentation

## 2017-12-02 HISTORY — DX: Pneumonia, unspecified organism: J18.9

## 2017-12-02 LAB — BASIC METABOLIC PANEL
Anion gap: 8 (ref 5–15)
BUN: 16 mg/dL (ref 8–23)
CO2: 26 mmol/L (ref 22–32)
Calcium: 9.1 mg/dL (ref 8.9–10.3)
Chloride: 109 mmol/L (ref 98–111)
Creatinine, Ser: 0.81 mg/dL (ref 0.44–1.00)
GFR calc Af Amer: >60 mL/min (ref >60)
GFR calc non Af Amer: >60 mL/min (ref >60)
Glucose, Bld: 95 mg/dL (ref 70–99)
Potassium: 3.7 mmol/L (ref 3.5–5.1)
Sodium: 143 mmol/L (ref 135–145)

## 2017-12-02 LAB — CBC
HCT: 41 % (ref 36.0–46.0)
Hemoglobin: 12.6 g/dL (ref 12.0–15.0)
MCH: 28.3 pg (ref 26.0–34.0)
MCHC: 30.7 g/dL (ref 30.0–36.0)
MCV: 92.1 fL (ref 80.0–100.0)
Platelets: 247 10*3/uL (ref 150–400)
RBC: 4.45 MIL/uL (ref 3.87–5.11)
RDW: 14.3 % (ref 11.5–15.5)
WBC: 8 10*3/uL (ref 4.0–10.5)
nRBC: 0 % (ref 0.0–0.2)

## 2017-12-02 LAB — SURGICAL PCR SCREEN
MRSA, PCR: NEGATIVE
Staphylococcus aureus: NEGATIVE

## 2017-12-07 MED ORDER — GENTAMICIN SULFATE 40 MG/ML IJ SOLN
5.0000 mg/kg | INTRAVENOUS | Status: AC
  Administered 2017-12-08: 270 mg via INTRAVENOUS
  Filled 2017-12-07: qty 6.75

## 2017-12-08 ENCOUNTER — Inpatient Hospital Stay (HOSPITAL_COMMUNITY): Payer: Medicare Other

## 2017-12-08 ENCOUNTER — Inpatient Hospital Stay (HOSPITAL_COMMUNITY)
Admission: RE | Admit: 2017-12-08 | Discharge: 2017-12-10 | DRG: 470 | Disposition: A | Discharge status: Home / Self Care | Payer: Medicare Other | Attending: Orthopedic Surgery | Admitting: Orthopedic Surgery

## 2017-12-08 ENCOUNTER — Other Ambulatory Visit: Payer: Self-pay

## 2017-12-08 ENCOUNTER — Encounter (HOSPITAL_COMMUNITY): Payer: Self-pay | Admitting: Emergency Medicine

## 2017-12-08 ENCOUNTER — Inpatient Hospital Stay (HOSPITAL_COMMUNITY): Payer: Medicare Other | Admitting: Anesthesiology

## 2017-12-08 ENCOUNTER — Encounter (HOSPITAL_COMMUNITY)
Admission: RE | Disposition: A | Discharge status: Home / Self Care | Payer: Self-pay | Source: Home / Self Care | Attending: Orthopedic Surgery

## 2017-12-08 DIAGNOSIS — M25752 Osteophyte, left hip: Secondary | ICD-10-CM | POA: Diagnosis present

## 2017-12-08 DIAGNOSIS — M48062 Spinal stenosis, lumbar region with neurogenic claudication: Secondary | ICD-10-CM | POA: Diagnosis not present

## 2017-12-08 DIAGNOSIS — Z8349 Family history of other endocrine, nutritional and metabolic diseases: Secondary | ICD-10-CM

## 2017-12-08 DIAGNOSIS — Z882 Allergy status to sulfonamides status: Secondary | ICD-10-CM

## 2017-12-08 DIAGNOSIS — N952 Postmenopausal atrophic vaginitis: Secondary | ICD-10-CM | POA: Diagnosis present

## 2017-12-08 DIAGNOSIS — Z8379 Family history of other diseases of the digestive system: Secondary | ICD-10-CM

## 2017-12-08 DIAGNOSIS — Z96641 Presence of right artificial hip joint: Secondary | ICD-10-CM | POA: Diagnosis not present

## 2017-12-08 DIAGNOSIS — Z419 Encounter for procedure for purposes other than remedying health state, unspecified: Secondary | ICD-10-CM

## 2017-12-08 DIAGNOSIS — Z96642 Presence of left artificial hip joint: Secondary | ICD-10-CM

## 2017-12-08 DIAGNOSIS — Z7982 Long term (current) use of aspirin: Secondary | ICD-10-CM

## 2017-12-08 DIAGNOSIS — Z9981 Dependence on supplemental oxygen: Secondary | ICD-10-CM | POA: Diagnosis not present

## 2017-12-08 DIAGNOSIS — K219 Gastro-esophageal reflux disease without esophagitis: Secondary | ICD-10-CM | POA: Diagnosis not present

## 2017-12-08 DIAGNOSIS — Z880 Allergy status to penicillin: Secondary | ICD-10-CM

## 2017-12-08 DIAGNOSIS — Z87442 Personal history of urinary calculi: Secondary | ICD-10-CM

## 2017-12-08 DIAGNOSIS — Z881 Allergy status to other antibiotic agents status: Secondary | ICD-10-CM

## 2017-12-08 DIAGNOSIS — J309 Allergic rhinitis, unspecified: Secondary | ICD-10-CM | POA: Diagnosis not present

## 2017-12-08 DIAGNOSIS — Z8619 Personal history of other infectious and parasitic diseases: Secondary | ICD-10-CM | POA: Diagnosis not present

## 2017-12-08 DIAGNOSIS — E063 Autoimmune thyroiditis: Secondary | ICD-10-CM | POA: Diagnosis present

## 2017-12-08 DIAGNOSIS — Z6827 Body mass index (BMI) 27.0-27.9, adult: Secondary | ICD-10-CM

## 2017-12-08 DIAGNOSIS — Z96652 Presence of left artificial knee joint: Secondary | ICD-10-CM | POA: Insufficient documentation

## 2017-12-08 DIAGNOSIS — Z96649 Presence of unspecified artificial hip joint: Secondary | ICD-10-CM

## 2017-12-08 DIAGNOSIS — I872 Venous insufficiency (chronic) (peripheral): Secondary | ICD-10-CM | POA: Diagnosis not present

## 2017-12-08 DIAGNOSIS — Z833 Family history of diabetes mellitus: Secondary | ICD-10-CM

## 2017-12-08 DIAGNOSIS — I10 Essential (primary) hypertension: Secondary | ICD-10-CM | POA: Diagnosis present

## 2017-12-08 DIAGNOSIS — Z8249 Family history of ischemic heart disease and other diseases of the circulatory system: Secondary | ICD-10-CM | POA: Diagnosis not present

## 2017-12-08 DIAGNOSIS — J9611 Chronic respiratory failure with hypoxia: Secondary | ICD-10-CM | POA: Diagnosis present

## 2017-12-08 DIAGNOSIS — F418 Other specified anxiety disorders: Secondary | ICD-10-CM | POA: Diagnosis present

## 2017-12-08 DIAGNOSIS — Z90710 Acquired absence of both cervix and uterus: Secondary | ICD-10-CM | POA: Diagnosis not present

## 2017-12-08 DIAGNOSIS — Z823 Family history of stroke: Secondary | ICD-10-CM

## 2017-12-08 DIAGNOSIS — Z888 Allergy status to other drugs, medicaments and biological substances status: Secondary | ICD-10-CM

## 2017-12-08 DIAGNOSIS — Z7951 Long term (current) use of inhaled steroids: Secondary | ICD-10-CM

## 2017-12-08 DIAGNOSIS — Z9089 Acquired absence of other organs: Secondary | ICD-10-CM

## 2017-12-08 DIAGNOSIS — Z79890 Hormone replacement therapy (postmenopausal): Secondary | ICD-10-CM

## 2017-12-08 DIAGNOSIS — Z801 Family history of malignant neoplasm of trachea, bronchus and lung: Secondary | ICD-10-CM

## 2017-12-08 DIAGNOSIS — Z79899 Other long term (current) drug therapy: Secondary | ICD-10-CM

## 2017-12-08 DIAGNOSIS — Z9049 Acquired absence of other specified parts of digestive tract: Secondary | ICD-10-CM | POA: Diagnosis not present

## 2017-12-08 DIAGNOSIS — E663 Overweight: Secondary | ICD-10-CM | POA: Diagnosis present

## 2017-12-08 DIAGNOSIS — Z87891 Personal history of nicotine dependence: Secondary | ICD-10-CM

## 2017-12-08 DIAGNOSIS — Z471 Aftercare following joint replacement surgery: Secondary | ICD-10-CM | POA: Diagnosis not present

## 2017-12-08 DIAGNOSIS — M1612 Unilateral primary osteoarthritis, left hip: Secondary | ICD-10-CM | POA: Diagnosis not present

## 2017-12-08 DIAGNOSIS — J449 Chronic obstructive pulmonary disease, unspecified: Secondary | ICD-10-CM | POA: Diagnosis not present

## 2017-12-08 HISTORY — PX: TOTAL HIP ARTHROPLASTY: SHX124

## 2017-12-08 LAB — TYPE AND SCREEN
ABO/RH(D): O POS
Antibody Screen: NEGATIVE

## 2017-12-08 SURGERY — ARTHROPLASTY, HIP, TOTAL, ANTERIOR APPROACH
Anesthesia: Spinal | Site: Hip | Laterality: Left

## 2017-12-08 MED ORDER — ACETAMINOPHEN 325 MG PO TABS: 325.0000 mg | ORAL_TABLET | Freq: Four times a day (QID) | ORAL | Status: DC | PRN

## 2017-12-08 MED ORDER — LIFITEGRAST 5 % OP SOLN: 1.0000 [drp] | Freq: Two times a day (BID) | OPHTHALMIC | Status: DC

## 2017-12-08 MED ORDER — CHLORHEXIDINE GLUCONATE 4 % EX LIQD: 60.0000 mL | Freq: Once | CUTANEOUS | Status: DC

## 2017-12-08 MED ORDER — VANCOMYCIN HCL IN DEXTROSE 1-5 GM/200ML-% IV SOLN
1000.0000 mg | INTRAVENOUS | Status: AC
  Administered 2017-12-08: 1000 mg via INTRAVENOUS
  Filled 2017-12-08: qty 200

## 2017-12-08 MED ORDER — POLYETHYLENE GLYCOL 3350 17 G PO PACK
17.0000 g | PACK | Freq: Two times a day (BID) | ORAL | Status: DC
  Administered 2017-12-09 – 2017-12-10 (×2): 17 g via ORAL
  Filled 2017-12-08 (×3): qty 1

## 2017-12-08 MED ORDER — ALBUTEROL SULFATE (2.5 MG/3ML) 0.083% IN NEBU: 2.5000 mg | INHALATION_SOLUTION | Freq: Four times a day (QID) | RESPIRATORY_TRACT | Status: DC | PRN

## 2017-12-08 MED ORDER — SUGAMMADEX SODIUM 200 MG/2ML IV SOLN
INTRAVENOUS | Status: DC | PRN
  Administered 2017-12-08: 150 mg via INTRAVENOUS

## 2017-12-08 MED ORDER — AMLODIPINE BESYLATE 5 MG PO TABS
5.0000 mg | ORAL_TABLET | Freq: Every day | ORAL | Status: DC
  Administered 2017-12-09 – 2017-12-10 (×2): 5 mg via ORAL
  Filled 2017-12-08 (×2): qty 1

## 2017-12-08 MED ORDER — DOCUSATE SODIUM 100 MG PO CAPS
100.0000 mg | ORAL_CAPSULE | Freq: Two times a day (BID) | ORAL | Status: DC
  Administered 2017-12-08 – 2017-12-10 (×4): 100 mg via ORAL
  Filled 2017-12-08 (×4): qty 1

## 2017-12-08 MED ORDER — HYDROCODONE-ACETAMINOPHEN 7.5-325 MG PO TABS: 1.0000 | ORAL_TABLET | ORAL | 0 refills | Status: DC | PRN

## 2017-12-08 MED ORDER — TIOTROPIUM BROMIDE MONOHYDRATE 18 MCG IN CAPS: 18.0000 ug | ORAL_CAPSULE | Freq: Every day | RESPIRATORY_TRACT | Status: DC

## 2017-12-08 MED ORDER — VENLAFAXINE HCL 50 MG PO TABS
50.0000 mg | ORAL_TABLET | Freq: Two times a day (BID) | ORAL | Status: DC
  Administered 2017-12-08 – 2017-12-10 (×4): 50 mg via ORAL
  Filled 2017-12-08 (×5): qty 1

## 2017-12-08 MED ORDER — FERROUS SULFATE 325 (65 FE) MG PO TABS
325.0000 mg | ORAL_TABLET | Freq: Three times a day (TID) | ORAL | Status: DC
  Administered 2017-12-08 – 2017-12-10 (×6): 325 mg via ORAL
  Filled 2017-12-08 (×6): qty 1

## 2017-12-08 MED ORDER — METOCLOPRAMIDE HCL 5 MG/ML IJ SOLN: 5.0000 mg | Freq: Three times a day (TID) | INTRAMUSCULAR | Status: DC | PRN

## 2017-12-08 MED ORDER — MENTHOL 3 MG MT LOZG: 1.0000 | LOZENGE | OROMUCOSAL | Status: DC | PRN

## 2017-12-08 MED ORDER — METHOCARBAMOL 500 MG PO TABS: 500.0000 mg | ORAL_TABLET | Freq: Four times a day (QID) | ORAL | 0 refills | Status: DC | PRN

## 2017-12-08 MED ORDER — ALUM & MAG HYDROXIDE-SIMETH 200-200-20 MG/5ML PO SUSP: 15.0000 mL | ORAL | Status: DC | PRN

## 2017-12-08 MED ORDER — FENTANYL CITRATE (PF) 100 MCG/2ML IJ SOLN
INTRAMUSCULAR | Status: AC
  Filled 2017-12-08: qty 2

## 2017-12-08 MED ORDER — METHOCARBAMOL 500 MG IVPB - SIMPLE MED
500.0000 mg | Freq: Four times a day (QID) | INTRAVENOUS | Status: DC | PRN
  Administered 2017-12-08: 500 mg via INTRAVENOUS
  Filled 2017-12-08: qty 50

## 2017-12-08 MED ORDER — SUGAMMADEX SODIUM 200 MG/2ML IV SOLN
INTRAVENOUS | Status: AC
  Filled 2017-12-08: qty 2

## 2017-12-08 MED ORDER — VANCOMYCIN HCL IN DEXTROSE 1-5 GM/200ML-% IV SOLN
1000.0000 mg | Freq: Once | INTRAVENOUS | Status: AC
  Administered 2017-12-08: 1000 mg via INTRAVENOUS
  Filled 2017-12-08: qty 200

## 2017-12-08 MED ORDER — ASPIRIN 81 MG PO CHEW
81.0000 mg | CHEWABLE_TABLET | Freq: Two times a day (BID) | ORAL | Status: DC
  Administered 2017-12-08 – 2017-12-10 (×4): 81 mg via ORAL
  Filled 2017-12-08 (×4): qty 1

## 2017-12-08 MED ORDER — TRAMADOL HCL 50 MG PO TABS
50.0000 mg | ORAL_TABLET | Freq: Four times a day (QID) | ORAL | Status: DC
  Administered 2017-12-08 – 2017-12-10 (×8): 50 mg via ORAL
  Filled 2017-12-08 (×8): qty 1

## 2017-12-08 MED ORDER — TRANEXAMIC ACID-NACL 1000-0.7 MG/100ML-% IV SOLN
1000.0000 mg | Freq: Once | INTRAVENOUS | Status: AC
  Administered 2017-12-08: 1000 mg via INTRAVENOUS
  Filled 2017-12-08: qty 100

## 2017-12-08 MED ORDER — FENTANYL CITRATE (PF) 100 MCG/2ML IJ SOLN
25.0000 ug | INTRAMUSCULAR | Status: DC | PRN
  Administered 2017-12-08: 50 ug via INTRAVENOUS
  Administered 2017-12-08: 25 ug via INTRAVENOUS

## 2017-12-08 MED ORDER — ORAL CARE MOUTH RINSE
15.0000 mL | Freq: Two times a day (BID) | OROMUCOSAL | Status: DC
  Administered 2017-12-09 – 2017-12-10 (×3): 15 mL via OROMUCOSAL

## 2017-12-08 MED ORDER — METHOCARBAMOL 500 MG IVPB - SIMPLE MED
INTRAVENOUS | Status: AC
  Filled 2017-12-08: qty 50

## 2017-12-08 MED ORDER — HYDROMORPHONE HCL 1 MG/ML IJ SOLN: 0.5000 mg | INTRAMUSCULAR | Status: DC | PRN

## 2017-12-08 MED ORDER — LIDOCAINE 2% (20 MG/ML) 5 ML SYRINGE
INTRAMUSCULAR | Status: AC
  Filled 2017-12-08: qty 5

## 2017-12-08 MED ORDER — GUAIFENESIN ER 600 MG PO TB12: 600.0000 mg | ORAL_TABLET | Freq: Two times a day (BID) | ORAL | Status: DC | PRN

## 2017-12-08 MED ORDER — METOCLOPRAMIDE HCL 5 MG PO TABS: 5.0000 mg | ORAL_TABLET | Freq: Three times a day (TID) | ORAL | Status: DC | PRN

## 2017-12-08 MED ORDER — MOMETASONE FURO-FORMOTEROL FUM 200-5 MCG/ACT IN AERO
2.0000 | INHALATION_SPRAY | Freq: Two times a day (BID) | RESPIRATORY_TRACT | Status: DC
  Administered 2017-12-08 – 2017-12-10 (×4): 2 via RESPIRATORY_TRACT
  Filled 2017-12-08: qty 8.8

## 2017-12-08 MED ORDER — ROCURONIUM BROMIDE 50 MG/5ML IV SOSY
PREFILLED_SYRINGE | INTRAVENOUS | Status: DC | PRN
  Administered 2017-12-08: 50 mg via INTRAVENOUS

## 2017-12-08 MED ORDER — SODIUM CHLORIDE 0.9 % IV SOLN
INTRAVENOUS | Status: DC
  Administered 2017-12-08 – 2017-12-09 (×2): via INTRAVENOUS

## 2017-12-08 MED ORDER — ALBUTEROL SULFATE (2.5 MG/3ML) 0.083% IN NEBU
2.5000 mg | INHALATION_SOLUTION | Freq: Four times a day (QID) | RESPIRATORY_TRACT | Status: DC | PRN
  Administered 2017-12-08: 2.5 mg via RESPIRATORY_TRACT

## 2017-12-08 MED ORDER — ROCURONIUM BROMIDE 10 MG/ML (PF) SYRINGE
PREFILLED_SYRINGE | INTRAVENOUS | Status: AC
  Filled 2017-12-08: qty 10

## 2017-12-08 MED ORDER — FERROUS SULFATE 325 (65 FE) MG PO TABS: 325.0000 mg | ORAL_TABLET | Freq: Three times a day (TID) | ORAL | 3 refills | Status: DC

## 2017-12-08 MED ORDER — DOCUSATE SODIUM 100 MG PO CAPS: 100.0000 mg | ORAL_CAPSULE | Freq: Two times a day (BID) | ORAL | 0 refills | Status: DC

## 2017-12-08 MED ORDER — FENTANYL CITRATE (PF) 100 MCG/2ML IJ SOLN
INTRAMUSCULAR | Status: DC | PRN
  Administered 2017-12-08 (×2): 50 ug via INTRAVENOUS
  Administered 2017-12-08 (×2): 25 ug via INTRAVENOUS
  Administered 2017-12-08: 100 ug via INTRAVENOUS

## 2017-12-08 MED ORDER — PHENYLEPHRINE HCL 10 MG/ML IJ SOLN
INTRAVENOUS | Status: DC | PRN
  Administered 2017-12-08: 25 ug/min via INTRAVENOUS

## 2017-12-08 MED ORDER — DIPHENHYDRAMINE HCL 12.5 MG/5ML PO ELIX: 12.5000 mg | ORAL_SOLUTION | ORAL | Status: DC | PRN

## 2017-12-08 MED ORDER — LIDOCAINE 2% (20 MG/ML) 5 ML SYRINGE
INTRAMUSCULAR | Status: DC | PRN
  Administered 2017-12-08: 60 mg via INTRAVENOUS

## 2017-12-08 MED ORDER — LEVOTHYROXINE SODIUM 88 MCG PO TABS
88.0000 ug | ORAL_TABLET | Freq: Every day | ORAL | Status: DC
  Administered 2017-12-09 – 2017-12-10 (×2): 88 ug via ORAL
  Filled 2017-12-08 (×2): qty 1

## 2017-12-08 MED ORDER — PHENYLEPHRINE HCL 10 MG/ML IJ SOLN
INTRAMUSCULAR | Status: AC
  Filled 2017-12-08: qty 1

## 2017-12-08 MED ORDER — ONDANSETRON HCL 4 MG/2ML IJ SOLN
INTRAMUSCULAR | Status: AC
  Filled 2017-12-08: qty 2

## 2017-12-08 MED ORDER — PHENOL 1.4 % MT LIQD
1.0000 | OROMUCOSAL | Status: DC | PRN
  Filled 2017-12-08: qty 177

## 2017-12-08 MED ORDER — SODIUM CHLORIDE 0.9 % IR SOLN
Status: DC | PRN
  Administered 2017-12-08: 1000 mL

## 2017-12-08 MED ORDER — OXYCODONE HCL 5 MG/5ML PO SOLN: 5.0000 mg | Freq: Once | ORAL | Status: DC | PRN

## 2017-12-08 MED ORDER — DEXAMETHASONE SODIUM PHOSPHATE 10 MG/ML IJ SOLN
10.0000 mg | Freq: Once | INTRAMUSCULAR | Status: AC
  Administered 2017-12-08: 10 mg via INTRAVENOUS

## 2017-12-08 MED ORDER — PROPOFOL 10 MG/ML IV BOLUS
INTRAVENOUS | Status: DC | PRN
  Administered 2017-12-08: 10 mg via INTRAVENOUS
  Administered 2017-12-08: 100 mg via INTRAVENOUS
  Administered 2017-12-08: 10 mg via INTRAVENOUS

## 2017-12-08 MED ORDER — ONDANSETRON HCL 4 MG/2ML IJ SOLN
INTRAMUSCULAR | Status: DC | PRN
  Administered 2017-12-08: 4 mg via INTRAVENOUS

## 2017-12-08 MED ORDER — HYDROCODONE-ACETAMINOPHEN 7.5-325 MG PO TABS
1.0000 | ORAL_TABLET | ORAL | Status: DC | PRN
  Administered 2017-12-09 – 2017-12-10 (×3): 1 via ORAL
  Filled 2017-12-08 (×3): qty 1

## 2017-12-08 MED ORDER — METHOCARBAMOL 500 MG PO TABS
500.0000 mg | ORAL_TABLET | Freq: Four times a day (QID) | ORAL | Status: DC | PRN
  Administered 2017-12-08 – 2017-12-10 (×4): 500 mg via ORAL
  Filled 2017-12-08 (×4): qty 1

## 2017-12-08 MED ORDER — TRANEXAMIC ACID-NACL 1000-0.7 MG/100ML-% IV SOLN
1000.0000 mg | INTRAVENOUS | Status: AC
  Administered 2017-12-08: 1000 mg via INTRAVENOUS
  Filled 2017-12-08: qty 100

## 2017-12-08 MED ORDER — LACTATED RINGERS IV SOLN
INTRAVENOUS | Status: DC
  Administered 2017-12-08 (×2): via INTRAVENOUS

## 2017-12-08 MED ORDER — UMECLIDINIUM BROMIDE 62.5 MCG/INH IN AEPB
1.0000 | INHALATION_SPRAY | Freq: Every day | RESPIRATORY_TRACT | Status: DC
  Administered 2017-12-09 – 2017-12-10 (×2): 1 via RESPIRATORY_TRACT
  Filled 2017-12-08: qty 7

## 2017-12-08 MED ORDER — EPHEDRINE SULFATE-NACL 50-0.9 MG/10ML-% IV SOSY
PREFILLED_SYRINGE | INTRAVENOUS | Status: DC | PRN
  Administered 2017-12-08 (×2): 10 mg via INTRAVENOUS

## 2017-12-08 MED ORDER — OXYCODONE HCL 5 MG PO TABS: 5.0000 mg | ORAL_TABLET | Freq: Once | ORAL | Status: DC | PRN

## 2017-12-08 MED ORDER — ASPIRIN 81 MG PO CHEW: 81.0000 mg | CHEWABLE_TABLET | Freq: Two times a day (BID) | ORAL | 0 refills | Status: AC

## 2017-12-08 MED ORDER — EPHEDRINE 5 MG/ML INJ
INTRAVENOUS | Status: AC
  Filled 2017-12-08: qty 10

## 2017-12-08 MED ORDER — ONDANSETRON HCL 4 MG/2ML IJ SOLN: 4.0000 mg | Freq: Four times a day (QID) | INTRAMUSCULAR | Status: DC | PRN

## 2017-12-08 MED ORDER — DEXAMETHASONE SODIUM PHOSPHATE 10 MG/ML IJ SOLN
10.0000 mg | Freq: Once | INTRAMUSCULAR | Status: AC
  Administered 2017-12-09: 10 mg via INTRAVENOUS
  Filled 2017-12-08: qty 1

## 2017-12-08 MED ORDER — STERILE WATER FOR IRRIGATION IR SOLN
Status: DC | PRN
  Administered 2017-12-08: 2000 mL

## 2017-12-08 MED ORDER — ROFLUMILAST 500 MCG PO TABS
500.0000 ug | ORAL_TABLET | Freq: Every day | ORAL | Status: DC
  Administered 2017-12-09 – 2017-12-10 (×2): 500 ug via ORAL
  Filled 2017-12-08 (×2): qty 1

## 2017-12-08 MED ORDER — FLUTICASONE PROPIONATE 50 MCG/ACT NA SUSP
1.0000 | Freq: Every day | NASAL | Status: DC | PRN
  Filled 2017-12-08: qty 16

## 2017-12-08 MED ORDER — AZELASTINE HCL 0.1 % NA SOLN
2.0000 | Freq: Two times a day (BID) | NASAL | Status: DC | PRN
  Administered 2017-12-09 – 2017-12-10 (×2): 2 via NASAL
  Filled 2017-12-08: qty 30

## 2017-12-08 MED ORDER — DEXAMETHASONE SODIUM PHOSPHATE 10 MG/ML IJ SOLN
INTRAMUSCULAR | Status: AC
  Filled 2017-12-08: qty 1

## 2017-12-08 MED ORDER — DIAZEPAM 5 MG PO TABS
5.0000 mg | ORAL_TABLET | Freq: Every day | ORAL | Status: DC
  Administered 2017-12-08 – 2017-12-09 (×2): 5 mg via ORAL
  Filled 2017-12-08 (×2): qty 1

## 2017-12-08 MED ORDER — POLYETHYLENE GLYCOL 3350 17 G PO PACK: 17.0000 g | PACK | Freq: Two times a day (BID) | ORAL | 0 refills | Status: DC

## 2017-12-08 MED ORDER — PROPOFOL 10 MG/ML IV BOLUS
INTRAVENOUS | Status: AC
  Filled 2017-12-08: qty 40

## 2017-12-08 MED ORDER — ALBUTEROL SULFATE (2.5 MG/3ML) 0.083% IN NEBU
INHALATION_SOLUTION | RESPIRATORY_TRACT | Status: AC
  Filled 2017-12-08: qty 3

## 2017-12-08 MED ORDER — ONDANSETRON HCL 4 MG PO TABS: 4.0000 mg | ORAL_TABLET | Freq: Four times a day (QID) | ORAL | Status: DC | PRN

## 2017-12-08 MED ORDER — MAGNESIUM CITRATE PO SOLN: 1.0000 | Freq: Once | ORAL | Status: DC | PRN

## 2017-12-08 MED ORDER — CEFAZOLIN SODIUM-DEXTROSE 2-4 GM/100ML-% IV SOLN: 2.0000 g | Freq: Four times a day (QID) | INTRAVENOUS | Status: DC

## 2017-12-08 MED ORDER — ONDANSETRON HCL 4 MG/2ML IJ SOLN: 4.0000 mg | Freq: Once | INTRAMUSCULAR | Status: DC | PRN

## 2017-12-08 MED ORDER — BISACODYL 10 MG RE SUPP: 10.0000 mg | Freq: Every day | RECTAL | Status: DC | PRN

## 2017-12-08 MED ORDER — HYDROCODONE-ACETAMINOPHEN 5-325 MG PO TABS
1.0000 | ORAL_TABLET | ORAL | Status: DC | PRN
  Filled 2017-12-08: qty 2

## 2017-12-08 MED ORDER — ALBUTEROL SULFATE (2.5 MG/3ML) 0.083% IN NEBU: 3.0000 mL | INHALATION_SOLUTION | RESPIRATORY_TRACT | Status: DC | PRN

## 2017-12-08 MED ORDER — ROFLUMILAST 250 MCG PO TABS
500.0000 ug | ORAL_TABLET | Freq: Every day | ORAL | Status: DC
  Administered 2017-12-09: 500 ug via ORAL
  Filled 2017-12-08 (×2): qty 2

## 2017-12-08 SURGICAL SUPPLY — 46 items
BAG DECANTER FOR FLEXI CONT (MISCELLANEOUS) IMPLANT
BAG ZIPLOCK 12X15 (MISCELLANEOUS) IMPLANT
BLADE SAG 18X100X1.27 (BLADE) IMPLANT
BLADE SAW SGTL 18X1.27X75 (BLADE) ×2 IMPLANT
COVER PERINEAL POST (MISCELLANEOUS) ×2 IMPLANT
COVER SURGICAL LIGHT HANDLE (MISCELLANEOUS) ×2 IMPLANT
COVER WAND RF STERILE (DRAPES) ×2 IMPLANT
CUP ACET PINNACLE SECTR 50MM (Hips) ×1 IMPLANT
DERMABOND ADVANCED (GAUZE/BANDAGES/DRESSINGS) ×1
DERMABOND ADVANCED .7 DNX12 (GAUZE/BANDAGES/DRESSINGS) ×1 IMPLANT
DRAPE STERI IOBAN 125X83 (DRAPES) ×2 IMPLANT
DRAPE U-SHAPE 47X51 STRL (DRAPES) ×4 IMPLANT
DRESSING AQUACEL AG SP 3.5X10 (GAUZE/BANDAGES/DRESSINGS) ×1 IMPLANT
DRSG AQUACEL AG ADV 3.5X10 (GAUZE/BANDAGES/DRESSINGS) ×2 IMPLANT
DRSG AQUACEL AG SP 3.5X10 (GAUZE/BANDAGES/DRESSINGS) ×2
DURAPREP 26ML APPLICATOR (WOUND CARE) ×2 IMPLANT
ELECT REM PT RETURN 15FT ADLT (MISCELLANEOUS) ×2 IMPLANT
ELIMINATOR HOLE APEX DEPUY (Hips) ×2 IMPLANT
GLOVE BIOGEL M STRL SZ7.5 (GLOVE) IMPLANT
GLOVE BIOGEL PI IND STRL 7.5 (GLOVE) ×1 IMPLANT
GLOVE BIOGEL PI IND STRL 8.5 (GLOVE) ×1 IMPLANT
GLOVE BIOGEL PI INDICATOR 7.5 (GLOVE) ×1
GLOVE BIOGEL PI INDICATOR 8.5 (GLOVE) ×1
GLOVE ECLIPSE 8.0 STRL XLNG CF (GLOVE) ×4 IMPLANT
GLOVE ORTHO TXT STRL SZ7.5 (GLOVE) ×2 IMPLANT
GOWN STRL REUS W/TWL 2XL LVL3 (GOWN DISPOSABLE) ×2 IMPLANT
GOWN STRL REUS W/TWL LRG LVL3 (GOWN DISPOSABLE) ×2 IMPLANT
HEAD FEM STD 32X+1 STRL (Hips) ×2 IMPLANT
HOLDER FOLEY CATH W/STRAP (MISCELLANEOUS) ×2 IMPLANT
LINER ACET PNNCL PLUS4 NEUTRAL (Hips) ×1 IMPLANT
PACK ANTERIOR HIP CUSTOM (KITS) ×2 IMPLANT
PINNACLE PLUS 4 NEUTRAL (Hips) ×2 IMPLANT
PINNACLE SECTOR CUP 50MM (Hips) ×2 IMPLANT
SCREW 6.5MMX25MM (Screw) ×2 IMPLANT
STEM TRI LOC BPS SZ4 W GRIPTON (Hips) ×1 IMPLANT
SUT MNCRL AB 4-0 PS2 18 (SUTURE) ×2 IMPLANT
SUT STRATAFIX 0 PDS 27 VIOLET (SUTURE) ×2
SUT VIC AB 1 CT1 36 (SUTURE) ×8 IMPLANT
SUT VIC AB 2-0 CT1 27 (SUTURE) ×2
SUT VIC AB 2-0 CT1 TAPERPNT 27 (SUTURE) ×2 IMPLANT
SUTURE STRATFX 0 PDS 27 VIOLET (SUTURE) ×1 IMPLANT
TRAY FOLEY CATH 14FRSI W/METER (CATHETERS) ×2 IMPLANT
TRAY FOLEY MTR SLVR 16FR STAT (SET/KITS/TRAYS/PACK) IMPLANT
TRI LOC BPS SZ 4 W GRIPTON (Hips) ×2 IMPLANT
WATER STERILE IRR 1000ML POUR (IV SOLUTION) ×2 IMPLANT
YANKAUER SUCT BULB TIP 10FT TU (MISCELLANEOUS) ×2 IMPLANT

## 2017-12-08 NOTE — Op Note (Signed)
NAME:  Priscilla Cantrell NO.: [id number]      MEDICAL RECORD NO.: [id number]      FACILITY:  [facility]      PHYSICIAN:  Mauri Pole  DATE OF BIRTH:  [birth year]     DATE OF PROCEDURE:  12/08/2017                                 OPERATIVE REPORT         PREOPERATIVE DIAGNOSIS: Left  hip osteoarthritis.      POSTOPERATIVE DIAGNOSIS:  Left hip osteoarthritis.      PROCEDURE:  Left total hip replacement through an anterior approach   utilizing DePuy THR system, component size 41mm pinnacle cup, a size 32+4 neutral   Altrex liner, a size 4 Hi Tri Lock stem with a 32+1 Articuleze metal head ball.      SURGEON:  Pietro Cassis. [name], M.D.      ASSISTANT:  Nehemiah Massed, PA-C     ANESTHESIA:  General.      SPECIMENS:  None.      COMPLICATIONS:  None.      BLOOD LOSS:  350 cc     DRAINS:  None.      INDICATION OF THE PROCEDURE:  Priscilla Cantrell is a 82 y.o. female who had   presented to office for evaluation of left hip pain.  Radiographs revealed   progressive degenerative changes with bone-on-bone   articulation of the  hip joint, including subchondral cystic changes and osteophytes.  The patient had painful limited range of   motion significantly affecting their overall quality of life and function.  The patient was failing to    respond to conservative measures including medications and/or injections and activity modification and at this point was ready   to proceed with more definitive measures.  Consent was obtained for   benefit of pain relief.  Specific risks of infection, DVT, component   failure, dislocation, neurovascular injury, and need for revision surgery were reviewed in the office as well discussion of   the anterior versus posterior approach were reviewed.     PROCEDURE IN DETAIL:  The patient was brought to operative theater.   Once adequate anesthesia, preoperative antibiotics, 1 gm of Vancomycin, wt dosed gentamycin, 1  gm of Tranexamic Acid, and 10 mg of Decadron were administered, the patient was positioned supine on the [employer] table.  Once the patient was safely positioned with adequate padding of boney prominences we predraped out the hip, and used fluoroscopy to confirm orientation of the pelvis.      The left hip was then prepped and draped from proximal iliac crest to   mid thigh with a shower curtain technique.      Time-out was performed identifying the patient, planned procedure, and the appropriate extremity.     An incision was then made 2 cm lateral to the   anterior superior iliac spine extending over the orientation of the   tensor fascia lata muscle and sharp dissection was carried down to the   fascia of the muscle.      The fascia was then incised.  The muscle belly was identified and swept   laterally and retractor placed along the superior neck.  Following   cauterization of the circumflex vessels and  removing some pericapsular   fat, a second cobra retractor was placed on the inferior neck.  A T-capsulotomy was made along the line of the   superior neck to the trochanteric fossa, then extended proximally and   distally.  Tag sutures were placed and the retractors were then placed   intracapsular.  We then identified the trochanteric fossa and   orientation of my neck cut and then made a neck osteotomy with the femur on traction.  The femoral   head was removed without difficulty or complication.  Traction was let   off and retractors were placed posterior and anterior around the   acetabulum.      The labrum and foveal tissue were debrided.  I began reaming with a 44 mm   reamer and reamed up to 49 mm reamer with good bony bed preparation and a 50 mm  cup was chosen.  The final 50 mm Pinnacle cup was then impacted under fluoroscopy to confirm the depth of penetration and orientation with respect to   Abduction and forward flexion.  A screw was placed into the ilium followed by the  hole eliminator.  The final   32+4 neutral Altrex liner was impacted with good visualized rim fit.  The cup was positioned anatomically within the acetabular portion of the pelvis.      At this point, the femur was rolled to 100 degrees.  Further capsule was   released off the inferior aspect of the femoral neck.  I then   released the superior capsule proximally.  With the leg in a neutral position the hook was placed laterally   along the femur under the vastus lateralis origin and elevated manually and then held in position using the hook attachment on the bed.  The leg was then extended and adducted with the leg rolled to 100   degrees of external rotation.  Retractors were placed along the medial calcar and posteriorly over the greater trochanter.  Once the proximal femur was fully   exposed, I used a box osteotome to set orientation.  I then began   broaching with the starting chili pepper broach and passed this by hand and then broached up to 4 matching the other hip previously replaced.  With the 4 broach in place I chose a high offset neck and did several trial reductions.  The offset was appropriate, leg lengths   appeared to be equal best matched with the +1 head ball trial confirmed radiographically.   Given these findings, I went ahead and dislocated the hip, repositioned all   retractors and positioned the right hip in the extended and abducted position.  The final 4 Hi Tri Lock stem was   chosen and it was impacted down to the level of neck cut.  Based on this   and the trial reductions, a final 32+1 Articuleze metal head ball was chosen and   impacted onto a clean and dry trunnion, and the hip was reduced.  The   hip had been irrigated throughout the case again at this point.  I did   reapproximate the superior capsular leaflet to the anterior leaflet   using #1 Vicryl.  The fascia of the   tensor fascia lata muscle was then reapproximated using #1 Vicryl and #0 Stratafix sutures.   The   remaining wound was closed with 2-0 Vicryl and running 4-0 Monocryl.   The hip was cleaned, dried, and dressed sterilely using Dermabond and   Aquacel  dressing.  The patient was then brought   to recovery room in stable condition tolerating the procedure well.    Nehemiah Massed, PA-C was present for the entirety of the case involved from   preoperative positioning, perioperative retractor management, general   facilitation of the case, as well as primary wound closure as assistant.            Pietro Cassis [name], M.D.        12/08/2017 12:47 PM

## 2017-12-08 NOTE — Discharge Instructions (Signed)

## 2017-12-08 NOTE — Anesthesia Postprocedure Evaluation (Signed)
Anesthesia Post Note  Patient: Priscilla Cantrell  Procedure(s) Performed: LEFT TOTAL HIP ARTHROPLASTY ANTERIOR APPROACH (Left Hip)     Patient location during evaluation: PACU Anesthesia Type: General Level of consciousness: awake and alert Pain management: pain level controlled Vital Signs Assessment: post-procedure vital signs reviewed and stable Respiratory status: spontaneous breathing, nonlabored ventilation, respiratory function stable and patient connected to nasal cannula oxygen Cardiovascular status: blood pressure returned to baseline and stable Postop Assessment: no apparent nausea or vomiting Anesthetic complications: no    Last Vitals:  Vitals:   12/08/17 1530 12/08/17 1546  BP: 127/64 136/69  Pulse: 77 77  Resp: 15 15  Temp: 36.6 C 36.9 C  SpO2: 92% 92%    Last Pain:  Vitals:   12/08/17 1546  TempSrc:   PainSc: [place]

## 2017-12-08 NOTE — Evaluation (Signed)
Physical Therapy Evaluation Patient Details Name: Priscilla Cantrell MRN: [id number] DOB: 17-Jun-1935 Today's Date: 12/08/2017   History of Present Illness  82 yo female s/p L DA-THA on 12/08/17. PMH includes pyelonephritis, chronic hypoxemic respiratory failure, SOB, HTN, UTI, R THA 2016, spinal stenosis, depression, anxiety, COPD on 2LO2 at night, R hip closed reduction 2017, SOB.   Clinical Impression   Pt presents with L hip pain, decreased LLE ROM limited by pain, generalized LE weakness, difficulty with bed mobility/transfers/ambulation, and decreased tolerance to mobility secondary to pain. Pt to benefit from acute PT to address deficits. Pt ambulated 15 ft with RW with min assist for steadying, and required mod assist for bed mobility. Pt educated on quad sets (5-10/hour), ankle pumps (20/hour), and heel slides (5-10/hour) to perform this afternoon/evening to lessen stiffness and increase circulation, to pt's tolerance and limited by pain. PT recommending HHPT at this time given pt's deficits, current MD plan is home with HEP. PT to progress mobility as tolerated, and will continue to follow acutely.      Follow Up Recommendations Supervision for mobility/OOB;Home health PT    Equipment Recommendations  None recommended by PT    Recommendations for Other Services       Precautions / Restrictions Precautions Precautions: Fall Restrictions Weight Bearing Restrictions: No LLE Weight Bearing: Weight bearing as tolerated      Mobility  Bed Mobility Overal bed mobility: Needs Assistance Bed Mobility: Supine to Sit     Supine to sit: HOB elevated;+2 for safety/equipment;Mod assist     General bed mobility comments: Mod assist for trunk elevation, LE management, scooting to EOB with bed pad. Pt relying on UE propping for sitting balance. Pt with dyspnea upon sitting EOB, needing 1 minute rest break to recover.   Transfers Overall transfer level: Needs assistance Equipment used:  Rolling walker (2 wheeled) Transfers: Sit to/from Stand Sit to Stand: Mod assist;From elevated surface;+2 safety/equipment         General transfer comment: Mod assist for LLE guarding (L foot sliding forward with WB), power up, and steadying. Verbal cuing for hand placement. Increased time to stand.   Ambulation/Gait Ambulation/Gait assistance: Min assist;+2 safety/equipment(chair follow ) Gait Distance (Feet): 15 Feet Assistive device: Rolling walker (2 wheeled) Gait Pattern/deviations: Step-to pattern;Decreased stride length;Decreased stance time - left;Decreased weight shift to left Gait velocity: very decr    General Gait Details: min assist for steadying. VC for sequencing, placement in RW.   Stairs            Wheelchair Mobility    Modified Rankin (Stroke Patients Only)       Balance Overall balance assessment: Needs assistance Sitting-balance support: Single extremity supported Sitting balance-Leahy Scale: Fair     Standing balance support: Bilateral upper extremity supported Standing balance-Leahy Scale: Poor Standing balance comment: relies on PT support and RW                              Pertinent Vitals/Pain Pain Assessment: 0-10 Pain Score: 6  Pain Location: L hip  Pain Descriptors / Indicators: Sore Pain Intervention(s): Limited activity within patient's tolerance;Repositioned;Ice applied;Monitored during session    Iraan expects to be discharged to:: Private residence Living Arrangements: Alone Available Help at Discharge: Family;Available PRN/intermittently;Available 24 hours/day;Friend(s)(daughter lives 1 mile from her and will be there overnight with pt and on weekends. one of pt's friends from church is staying with her all the time  for as long as she needs) Type of Home: House Home Access: Stairs to enter Entrance Stairs-Rails: None Entrance Stairs-Number of Steps: 2 Home Layout: Two level;Able to live on  main level with bedroom/bathroom Home Equipment: Shower seat;Walker - 2 wheels;Cane - quad;Grab bars - tub/shower;Walker - 4 wheels;Toilet riser      Prior Function Level of Independence: Independent with assistive device(s)         Comments: using cane for the past 1.5 weeks.      Hand Dominance   Dominant Hand: Right    Extremity/Trunk Assessment   Upper Extremity Assessment Upper Extremity Assessment: Generalized weakness    Lower Extremity Assessment Lower Extremity Assessment: Generalized weakness;LLE deficits/detail LLE Deficits / Details: suspected post-surgical hip weakness; able to perform weak quad set, ankle pumps  LLE Sensation: WNL    Cervical / Trunk Assessment Cervical / Trunk Assessment: Normal  Communication   Communication: No difficulties  Cognition Arousal/Alertness: Awake/alert Behavior During Therapy: WFL for tasks assessed/performed Overall Cognitive Status: Within Functional Limits for tasks assessed                                        General Comments      Exercises Total Joint Exercises Ankle Circles/Pumps: AROM;Both;10 reps;Seated   Assessment/Plan    PT Assessment Patient needs continued PT services  PT Problem List Decreased strength;Pain;Decreased range of motion;Decreased activity tolerance;Decreased knowledge of use of DME;Decreased balance;Decreased safety awareness;Decreased mobility       PT Treatment Interventions DME instruction;Therapeutic activities;Gait training;Therapeutic exercise;Patient/family education;Stair training;Balance training;Functional mobility training    PT Goals (Current goals can be found in the Care Plan section)  Acute Rehab PT Goals Patient Stated Goal: go home  PT Goal Formulation: With patient Time For Goal Achievement: 12/15/17 Potential to Achieve Goals: Good    Frequency 7X/week   Barriers to discharge        Co-evaluation               AM-PAC PT "6 Clicks"  Daily Activity  Outcome Measure Difficulty turning over in bed (including adjusting bedclothes, sheets and blankets)?: Unable Difficulty moving from lying on back to sitting on the side of the bed? : Unable Difficulty sitting down on and standing up from a chair with arms (e.g., wheelchair, bedside commode, etc,.)?: Unable Help needed moving to and from a bed to chair (including a wheelchair)?: A Little Help needed walking in hospital room?: A Little Help needed climbing 3-5 steps with a railing? : A Lot 6 Click Score: 11    End of Session Equipment Utilized During Treatment: Gait belt Activity Tolerance: Patient tolerated treatment well;Patient limited by fatigue Patient left: in chair;with chair alarm set;with call bell/phone within reach;with SCD's reapplied   PT Visit Diagnosis: Other abnormalities of gait and mobility (R26.89);Difficulty in walking, not elsewhere classified (R26.2)    Time: 9833-8250 PT Time Calculation (min) (ACUTE ONLY): 26 min   Charges:   PT Evaluation $PT Eval Low Complexity: 1 Low PT Treatments $Gait Training: 8-22 mins        Julien Girt, PT Acute Rehabilitation Services Pager [phone]  Office [phone]   Jacquees Gongora D Elonda Husky 12/08/2017, 7:28 PM

## 2017-12-08 NOTE — Transfer of Care (Signed)
Immediate Anesthesia Transfer of Care Note  Patient: Priscilla Cantrell  Procedure(s) Performed: LEFT TOTAL HIP ARTHROPLASTY ANTERIOR APPROACH (Left Hip)  Patient Location: PACU  Anesthesia Type:General  Level of Consciousness: awake, alert  and oriented  Airway & Oxygen Therapy: Patient Spontanous Breathing and Patient connected to face mask oxygen  Post-op Assessment: Report given to RN and Post -op Vital signs reviewed and stable  Post vital signs: Reviewed and stable  Last Vitals:  Vitals Value Taken Time  BP 148/67 12/08/2017  1:30 PM  Temp    Pulse 79 12/08/2017  1:31 PM  Resp 23 12/08/2017  1:31 PM  SpO2 97 % 12/08/2017  1:31 PM  Vitals shown include unvalidated device data.  Last Pain:  Vitals:   12/08/17 0941  TempSrc:   PainSc: 0-No pain      Patients Stated Pain Goal: 4 (15/17/61 6073)  Complications: No apparent anesthesia complications

## 2017-12-08 NOTE — Plan of Care (Signed)
  Problem: Health Behavior/Discharge Planning: Goal: Ability to manage health-related needs will improve Outcome: Progressing   Problem: Clinical Measurements: Goal: Ability to maintain clinical measurements within normal limits will improve Outcome: Progressing Goal: Will remain free from infection Outcome: Progressing Goal: Diagnostic test results will improve Outcome: Progressing Goal: Respiratory complications will improve Outcome: Progressing Goal: Cardiovascular complication will be avoided Outcome: Progressing   Problem: Activity: Goal: Risk for activity intolerance will decrease Outcome: Progressing   Problem: Nutrition: Goal: Adequate nutrition will be maintained Outcome: Progressing   Problem: Elimination: Goal: Will not experience complications related to bowel motility Outcome: Progressing Goal: Will not experience complications related to urinary retention Outcome: Progressing   Problem: Pain Managment: Goal: General experience of comfort will improve Outcome: Progressing   Problem: Safety: Goal: Ability to remain free from injury will improve Outcome: Progressing   Problem: Skin Integrity: Goal: Risk for impaired skin integrity will decrease Outcome: Progressing   Problem: Education: Goal: Knowledge of the prescribed therapeutic regimen will improve Outcome: Progressing Goal: Understanding of discharge needs will improve Outcome: Progressing Goal: Individualized Educational Video(s) Outcome: Progressing   Problem: Activity: Goal: Ability to avoid complications of mobility impairment will improve Outcome: Progressing Goal: Ability to tolerate increased activity will improve Outcome: Progressing   Problem: Clinical Measurements: Goal: Postoperative complications will be avoided or minimized Outcome: Progressing   Problem: Pain Management: Goal: Pain level will decrease with appropriate interventions Outcome: Progressing

## 2017-12-08 NOTE — Care Plan (Signed)
Ortho Bundle Case Management Note  Patient Details  Name: Priscilla Cantrell MRN: [id number] Date of Birth: [birth year]  DCP:  Home with friend.  2 story home with 4 ste.  BR is on the 1st floor. P/O PT:  HEP                   DME Arranged:  N/A(Has a RW and a seat elevator on toilet) DME Agency:  NA  HH Arranged:  NA HH Agency:  NA  Additional Comments: Please contact me with any questions of if this plan should need to change.  Marianne Sofia, RN,CCM [facility]  [phone] 12/08/2017, 9:26 AM

## 2017-12-08 NOTE — Interval H&P Note (Signed)
History and Physical Interval Note:  12/08/2017 9:55 AM  Priscilla Cantrell  has presented today for surgery, with the diagnosis of Left hip osteoarthritis  The various methods of treatment have been discussed with the patient and family. After consideration of risks, benefits and other options for treatment, the patient has consented to  Procedure(s) with comments: LEFT TOTAL HIP ARTHROPLASTY ANTERIOR APPROACH (Left) - 70 mins as a surgical intervention .  The patient's history has been reviewed, patient examined, no change in status, stable for surgery.  I have reviewed the patient's chart and labs.  Questions were answered to the patient's satisfaction.     Mauri Pole

## 2017-12-08 NOTE — Anesthesia Preprocedure Evaluation (Addendum)
Anesthesia Evaluation  Patient identified by MRN, date of birth, ID band Patient awake    Reviewed: Allergy & Precautions, NPO status , [name] Chart, lab work & pertinent test results  History of Anesthesia Complications (+) Family history of anesthesia reaction and history of anesthetic complications (unclear family history of mother's death during thyroid operation)  Airway Mallampati: II  TM Distance: >3 FB Neck ROM: Full    Dental  (+) Edentulous Upper, Edentulous Lower   Pulmonary COPD (nocturnal and exertional O2),  COPD inhaler, former smoker,    breath sounds clear to auscultation       Cardiovascular hypertension, Pt. on medications  Rhythm:Regular Rate:Normal   '16 TTE - EF 60% to 65%. Grade 1 diastolic dysfunction.    Neuro/Psych PSYCHIATRIC DISORDERS Anxiety Depression  Spinal stenosis with bilateral foot numbness     GI/Hepatic Neg liver ROS, GERD  Medicated,  Endo/Other  Hypothyroidism   Renal/GU     Chronic cystitis with hematuria     Musculoskeletal  (+) Arthritis ,   Abdominal   Peds  Hematology negative hematology ROS (+)   Anesthesia Other Findings PCN - Anaphylaxis  Reproductive/Obstetrics                            Anesthesia Physical Anesthesia Plan  ASA: III  Anesthesia Plan: Spinal   Post-op Pain Management:    Induction:   PONV Risk Score and Plan: 2 and Treatment may vary due to age or medical condition and Propofol infusion  Airway Management Planned: Natural Airway and Simple Face Mask  Additional Equipment: None  Intra-op Plan:   Post-operative Plan:   Informed Consent: I have reviewed the patients History and Physical, chart, labs and discussed the procedure including the risks, benefits and alternatives for the proposed anesthesia with the patient or authorized representative who has indicated his/her understanding and acceptance.      Plan Discussed with: CRNA and Anesthesiologist  Anesthesia Plan Comments: (Labs reviewed, platelets acceptable. Discussed risks and benefits of spinal, including spinal/epidural hematoma, infection, failed block, and PDPH. Patient expressed understanding and wished to proceed. )       Anesthesia Quick Evaluation

## 2017-12-08 NOTE — Anesthesia Procedure Notes (Signed)
Procedure Name: Intubation Date/Time: 12/08/2017 11:37 AM Performed by: Maxwell Caul, CRNA Pre-anesthesia Checklist: Patient identified, Emergency Drugs available, Suction available and Patient being monitored Patient Re-evaluated:Patient Re-evaluated prior to induction Oxygen Delivery Method: Circle system utilized Preoxygenation: Pre-oxygenation with 100% oxygen Induction Type: IV induction Ventilation: Mask ventilation without difficulty Laryngoscope Size: Mac and 4 Grade View: Grade I Tube type: Oral Tube size: 7.0 mm Number of attempts: 1 Airway Equipment and Method: Stylet Placement Confirmation: ETT inserted through vocal cords under direct vision,  positive ETCO2 and breath sounds checked- equal and bilateral Secured at: 18 cm Tube secured with: Tape Dental Injury: Teeth and Oropharynx as per pre-operative assessment

## 2017-12-09 DIAGNOSIS — E663 Overweight: Secondary | ICD-10-CM | POA: Diagnosis present

## 2017-12-09 LAB — CBC
HCT: 34.6 % — ABNORMAL LOW (ref 36.0–46.0)
Hemoglobin: 10.9 g/dL — ABNORMAL LOW (ref 12.0–15.0)
MCH: 28.8 pg (ref 26.0–34.0)
MCHC: 31.5 g/dL (ref 30.0–36.0)
MCV: 91.5 fL (ref 80.0–100.0)
Platelets: 207 10*3/uL (ref 150–400)
RBC: 3.78 MIL/uL — ABNORMAL LOW (ref 3.87–5.11)
RDW: 14 % (ref 11.5–15.5)
WBC: 12.2 10*3/uL — ABNORMAL HIGH (ref 4.0–10.5)
nRBC: 0 % (ref 0.0–0.2)

## 2017-12-09 LAB — BASIC METABOLIC PANEL
Anion gap: 5 (ref 5–15)
BUN: 11 mg/dL (ref 8–23)
CO2: 23 mmol/L (ref 22–32)
Calcium: 8.6 mg/dL — ABNORMAL LOW (ref 8.9–10.3)
Chloride: 111 mmol/L (ref 98–111)
Creatinine, Ser: 0.67 mg/dL (ref 0.44–1.00)
GFR calc Af Amer: >60 mL/min (ref >60)
GFR calc non Af Amer: >60 mL/min (ref >60)
Glucose, Bld: 148 mg/dL — ABNORMAL HIGH (ref 70–99)
Potassium: 4 mmol/L (ref 3.5–5.1)
Sodium: 139 mmol/L (ref 135–145)

## 2017-12-09 NOTE — Progress Notes (Signed)
     Subjective: 1 Day Post-Op Procedure(s) (LRB): LEFT TOTAL HIP ARTHROPLASTY ANTERIOR APPROACH (Left)   Patient reports pain as mild, pain controlled.  No events throughout the night. Encouragement offered that she will do well with recovery.  Looking forward to recovering.  Ready to be discharged home if she does well with PT.    Objective:   VITALS:   Vitals:   12/09/17 0200 12/09/17 0552  BP: (!) 120/55 136/66  Pulse: 77 76  Resp: 15 15  Temp: 98 F (36.7 C) 98 F (36.7 C)  SpO2: 91% 92%    Dorsiflexion/Plantar flexion intact Incision: dressing C/D/I No cellulitis present Compartment soft  LABS Recent Labs    12/09/17 0531  HGB 10.9*  HCT 34.6*  WBC 12.2*  PLT 207    Recent Labs    12/09/17 0531  NA 139  K 4.0  BUN 11  CREATININE 0.67  GLUCOSE 148*     Assessment/Plan: 1 Day Post-Op Procedure(s) (LRB): LEFT TOTAL HIP ARTHROPLASTY ANTERIOR APPROACH (Left) Foley cath d/c'ed Advance diet Up with therapy D/C IV fluids Discharge home Follow up in 2 weeks at [facility] ([place]). Follow up with [name],Rosabell Geyer D in 2 weeks.  Contact information:  [facility]) [address], [place] [phone]    Overweight (BMI 25-29.9) Estimated body mass index is 27.73 kg/m as calculated from the following:   Height as of this encounter: 5' (1.524 m).   Weight as of this encounter: 64.4 kg. Patient also counseled that weight may inhibit the healing process Patient counseled that losing weight will help with future health issues       [place] Pugh. Lyana Asbill   PAC  12/09/2017, 8:46 AM

## 2017-12-09 NOTE — Evaluation (Signed)
Occupational Therapy Evaluation Patient Details Name: Priscilla Cantrell MRN: [id number] DOB: 06-Jun-1935 Today's Date: 12/09/2017    History of Present Illness 82 yo female s/p L DA-THA on 12/08/17. PMH includes pyelonephritis, chronic hypoxemic respiratory failure, SOB, HTN, UTI, R THA 2016, spinal stenosis, depression, anxiety, COPD on 2LO2 at night, R hip closed reduction 2017, SOB.    Clinical Impression   Pt was admitted for the above. She did better during OT session than she did with PT earlier today.  Pt will have 24/7 assistance at home.  If pt remains here, will practice bathroom transfers again with supervision to min guard level goals.  Pt was unable to clear feet high enough for shower transfer at time of evaluation.     Follow Up Recommendations  Supervision/Assistance - 24 hour    Equipment Recommendations  None recommended by OT    Recommendations for Other Services       Precautions / Restrictions Precautions Precautions: Fall Restrictions LLE Weight Bearing: Weight bearing as tolerated      Mobility Bed Mobility   Bed Mobility: Supine to Sit          General bed mobility comments: in recliner  Transfers Overall transfer level: Needs assistance Equipment used: 4-wheeled walker;Rolling walker (2 wheeled) Transfers: Sit to/from Stand Sit to Stand: Min assist         General transfer comment: light assist to stand and steady    Balance                                           ADL either performed or assessed with clinical judgement   ADL Overall ADL's : Needs assistance/impaired     Grooming: Wash/dry hands;Min guard;Standing       Lower Body Bathing: Moderate assistance;Sit to/from stand       Lower Body Dressing: Maximal assistance;Sit to/from stand   Toilet Transfer: Minimal assistance;Ambulation;BSC;RW   Toileting- Clothing Manipulation and Hygiene: Minimal assistance;Sit to/from stand         General ADL  Comments: ambulated to bathroom and performed toileting and grooming.  Pt does not use AE at home for adls. Friend who is a Marine scientist, will stay with her 24/7 as needed.  Simulated shower transfer:  pt unable to clear leg high enough right now.  Handout on sequence provided. Pt can perform UB adls with set up     Vision         Perception     Praxis      Pertinent Vitals/Pain Pain Score: 1  Pain Location: L hip Pain Descriptors / Indicators: Sore Pain Intervention(s): Limited activity within patient's tolerance;Monitored during session;Premedicated before session;Repositioned     Hand Dominance     Extremity/Trunk Assessment Upper Extremity Assessment Upper Extremity Assessment: Generalized weakness           Communication Communication Communication: No difficulties   Cognition Arousal/Alertness: Awake/alert Behavior During Therapy: WFL for tasks assessed/performed Overall Cognitive Status: Within Functional Limits for tasks assessed                                 General Comments: less anxious   General Comments  sats 80 after ambulating to bathroom without 02.  When 02 placed on her, she reports that she was using it all the time for the  past couple of weeks    Exercises Total Joint Exercises Ankle Circles/Pumps: AROM;10 reps;Both Short Arc Quad: AAROM;Left;10 reps Heel Slides: AAROM;Left;10 reps;Supine Hip ABduction/ADduction: AAROM;Left;10 reps;Supine   Shoulder Instructions      Home Living Family/patient expects to be discharged to:: Private residence Living Arrangements: Alone Available Help at Discharge: Family;Available PRN/intermittently;Available 24 hours/day;Friend(s)               Bathroom Shower/Tub: Occupational psychologist: Handicapped height     Home Equipment: Clinical cytogeneticist - 2 wheels;Cane - quad;Grab bars - tub/shower;Walker - 4 wheels;Toilet riser          Prior Functioning/Environment Level of  Independence: Independent with assistive device(s)        Comments: using cane last 1.5 weeks and 02 most of the time for past couple of weeks. Prior to this she used at night        OT Problem List: Decreased strength;Decreased activity tolerance;Cardiopulmonary status limiting activity;Pain;Decreased knowledge of use of DME or AE      OT Treatment/Interventions: Self-care/ADL training;DME and/or AE instruction;Patient/family education;Therapeutic activities    OT Goals(Current goals can be found in the care plan section) Acute Rehab OT Goals Patient Stated Goal: go home  OT Goal Formulation: With patient Time For Goal Achievement: 12/23/17 Potential to Achieve Goals: Good ADL Goals Pt Will Transfer to Toilet: with supervision;ambulating;bedside commode Pt Will Perform Tub/Shower Transfer: Shower transfer;with min guard assist;ambulating;shower seat Additional ADL Goal #1: pt will verbalize vs demonstrate use of AE for adls  OT Frequency: Min 2X/week   Barriers to D/C:            Co-evaluation              AM-PAC PT "6 Clicks" Daily Activity     Outcome Measure Help from another person eating meals?: None Help from another person taking care of personal grooming?: A Little Help from another person toileting, which includes using toliet, bedpan, or urinal?: A Little Help from another person bathing (including washing, rinsing, drying)?: A Lot Help from another person to put on and taking off regular upper body clothing?: A Little Help from another person to put on and taking off regular lower body clothing?: A Lot 6 Click Score: 17   End of Session    Activity Tolerance: Patient tolerated treatment well Patient left: in chair;with call bell/phone within reach;with chair alarm set  OT Visit Diagnosis: Pain Pain - Right/Left: Left Pain - part of body: Hip                Time: 4098-1191 OT Time Calculation (min): 20 min Charges:  OT General Charges $OT Visit: 1  Visit OT Evaluation $OT Eval Low Complexity: 1 Low  Lesle Chris, OTR/L Acute Rehabilitation Services [phone] [facility] pager [phone] office 12/09/2017  [place] 12/09/2017, 11:17 AM

## 2017-12-09 NOTE — Progress Notes (Addendum)
Physical Therapy Treatment Patient Details Name: Priscilla Cantrell MRN: [id number] DOB: 1935-04-20 Today's Date: 12/09/2017    History of Present Illness 82 yo female s/p L DA-THA on 12/08/17. PMH includes pyelonephritis, chronic hypoxemic respiratory failure, SOB, HTN, UTI, R THA 2016, spinal stenosis, depression, anxiety, COPD on 2LO2 at night, R hip closed reduction 2017, SOB.     PT Comments    The Patient  Is quire anxious, tremulous and very guarded for activity to LLE and mobility. Also noted SOB. Patient reports uses inhaler which is not in room. RN aware.  Assisted to recliner and was medicated. Will check back. Patient has a friend who is caregiver. May need extra PT sessions to achieve goals for DC.  Follow Up Recommendations  Supervision for mobility/OOB;Home health PT     Equipment Recommendations  None recommended by PT    Recommendations for Other Services       Precautions / Restrictions Precautions Precautions: Fall Restrictions LLE Weight Bearing: Weight bearing as tolerated    Mobility  Bed Mobility   Bed Mobility: Supine to Sit     Supine to sit: Max assist;HOB elevated     General bed mobility comments: patient  began to  get anxious , left leg is quite tight and spasming.    Transfers Overall transfer level: Needs assistance Equipment used: Rolling walker (4 wheeled) Transfers: Sit to/from Stand Sit to Stand: Mod assist         General transfer comment: Mod assist for LLE guarding  assist to power up, and steadying. Verbal cuing for hand placement. Increased time to stand.   Ambulation/Gait Ambulation/Gait assistance: Mod assist;+2 safety/equipment Gait Distance (Feet): 5 Feet Assistive device: Rolling walker (2 wheeled)       General Gait Details: patient complaining of spasms, decreased weigh bearing tolerated. Assisted to recliner. will ambulate after meds are effective.   Stairs             Wheelchair Mobility    Modified  Rankin (Stroke Patients Only)       Balance                                            Cognition Arousal/Alertness: Awake/alert Behavior During Therapy: Anxious                                          Exercises Total Joint Exercises Ankle Circles/Pumps: AROM;10 reps;Both Short Arc Quad: AAROM;Left;10 reps Heel Slides: AAROM;Left;10 reps;Supine Hip ABduction/ADduction: AAROM;Left;10 reps;Supine    General Comments        Pertinent Vitals/Pain Pain Score: 10-Worst pain ever Pain Location: left thigh Pain Descriptors / Indicators: Tightness;Grimacing;Guarding;Crying;Cramping;Sharp;Shooting;Jabbing Pain Intervention(s): Monitored during session;Patient requesting pain meds-RN notified;RN gave pain meds during session;Ice applied;Repositioned    Home Living                      Prior Function            PT Goals (current goals can now be found in the care plan section) Progress towards PT goals: Progressing toward goals    Frequency    7X/week      PT Plan Current plan remains appropriate    Co-evaluation  AM-PAC PT "6 Clicks" Daily Activity  Outcome Measure  Difficulty turning over in bed (including adjusting bedclothes, sheets and blankets)?: Unable Difficulty moving from lying on back to sitting on the side of the bed? : Unable Difficulty sitting down on and standing up from a chair with arms (e.g., wheelchair, bedside commode, etc,.)?: Unable Help needed moving to and from a bed to chair (including a wheelchair)?: Total Help needed walking in hospital room?: Total Help needed climbing 3-5 steps with a railing? : Total 6 Click Score: 6    End of Session Equipment Utilized During Treatment: Gait belt Activity Tolerance: Patient limited by pain Patient left: in chair;with call bell/phone within reach;with chair alarm set;with nursing/sitter in room Nurse Communication: Mobility status PT  Visit Diagnosis: Other abnormalities of gait and mobility (R26.89);Difficulty in walking, not elsewhere classified (R26.2)     Time: 0315-9458 PT Time Calculation (min) (ACUTE ONLY): 49 min  Charges:  $Gait Training: 8-22 mins $Therapeutic Exercise: 8-22 mins $Self Care/Home Management: [place] Pager [phone] Office [phone]    Claretha Cooper 12/09/2017, 11:02 AM

## 2017-12-09 NOTE — Progress Notes (Signed)
Physical Therapy Treatment Patient Details Name: Priscilla Cantrell MRN: [id number] DOB: 1935-09-13 Today's Date: 12/09/2017    History of Present Illness 82 yo female s/p L DA-THA on 12/08/17. PMH includes pyelonephritis, chronic hypoxemic respiratory failure, SOB, HTN, UTI, R THA 2016, spinal stenosis, depression, anxiety, COPD on 2LO2 at night, R hip closed reduction 2017, SOB.     PT Comments    POD # 1 pm session Pt progressing slowly and still required much assist for mobility.  Assisted OOB to amb in hallway, practiced stairs, assisted to bathroom then back to bed.  very unsteady mild shaky gait with 25% VC's for proper walker to self distance and safety with turns   trial amb on RA sats decreased to 79% reapplied 2 lts to achieve 88- 90% Pt has a nurse friend who plans to stay with pt at home. Pt isl too unsteady to D/C to home today.  SATURATION QUALIFICATIONS: (This note is used to comply with regulatory documentation for home oxygen)  Patient Saturations on Room Air at Rest = 90%  Patient Saturations on Room Air while Ambulating = 79%  Patient Saturations on 2 Liters of oxygen while Ambulating = 89%  Please briefly explain why patient needs home oxygen:  Pt requires 2 lts O2 to achieve therapeutic levels.    Follow Up Recommendations  Supervision for mobility/OOB;Home health PT;Supervision/Assistance - 24 hour(pt will need 24/7 initial assist at home)     Equipment Recommendations  None recommended by PT    Recommendations for Other Services       Precautions / Restrictions Precautions Precautions: Fall Precaution Comments: HOME O2 2 lts  Restrictions Weight Bearing Restrictions: No LLE Weight Bearing: Weight bearing as tolerated    Mobility  Bed Mobility Overal bed mobility: Needs Assistance Bed Mobility: Supine to Sit;Sit to Supine     Supine to sit: Mod assist;Max assist Sit to supine: Max assist;Total assist   General bed mobility comments: required  assist OOB and back into bed   Transfers Overall transfer level: Needs assistance Equipment used: Rolling walker (2 wheeled) Transfers: Sit to/from [facility] Sit to Stand: Min assist Stand pivot transfers: Min assist;Mod assist       General transfer comment: 25% VC's on proper hand placement and safety with turns  Ambulation/Gait Ambulation/Gait assistance: Mod assist;+2 safety/equipment Gait Distance (Feet): 28 Feet Assistive device: Rolling walker (2 wheeled) Gait Pattern/deviations: Step-to pattern;Step-through pattern Gait velocity: decreased    General Gait Details: very unsteady mild shaky gait with 25% VC's for proper walker to self distance and safety with turns   trial amb on RA sats decreased to 79% reapplied 2 lts to achieve 88- 90%   Stairs             Wheelchair Mobility    Modified Rankin (Stroke Patients Only)       Balance                                            Cognition Arousal/Alertness: Awake/alert Behavior During Therapy: WFL for tasks assessed/performed Overall Cognitive Status: Within Functional Limits for tasks assessed                                        Exercises      General  Comments        Pertinent Vitals/Pain Pain Assessment: 0-10 Pain Score: 8  Pain Location: L hip Pain Descriptors / Indicators: Sore;Discomfort;Operative site guarding Pain Intervention(s): Monitored during session;RN gave pain meds during session;Repositioned;Ice applied    Home Living                      Prior Function            PT Goals (current goals can now be found in the care plan section) Progress towards PT goals: Progressing toward goals    Frequency    7X/week      PT Plan Current plan remains appropriate    Co-evaluation              AM-PAC PT "6 Clicks" Daily Activity  Outcome Measure  Difficulty turning over in bed (including adjusting  bedclothes, sheets and blankets)?: Unable Difficulty moving from lying on back to sitting on the side of the bed? : Unable Difficulty sitting down on and standing up from a chair with arms (e.g., wheelchair, bedside commode, etc,.)?: Unable Help needed moving to and from a bed to chair (including a wheelchair)?: Total Help needed walking in hospital room?: Total Help needed climbing 3-5 steps with a railing? : Total 6 Click Score: 6    End of Session Equipment Utilized During Treatment: Gait belt Activity Tolerance: Patient limited by fatigue;No increased pain Patient left: in bed;with bed alarm set;with call bell/phone within reach;with family/visitor present Nurse Communication: Mobility status PT Visit Diagnosis: Other abnormalities of gait and mobility (R26.89);Difficulty in walking, not elsewhere classified (R26.2)     Time: 9628-3662 PT Time Calculation (min) (ACUTE ONLY): 25 min  Charges:  $Gait Training: 8-22 mins $Therapeutic Activity: 8-22 mins                     Rica Koyanagi  PTA Acute  Rehabilitation Services Pager      [phone] Office      [phone]

## 2017-12-09 NOTE — Progress Notes (Signed)
Physical Therapy Treatment Patient Details Name: Priscilla Cantrell MRN: [id number] DOB: [birth year] Today's Date: 12/09/2017    History of Present Illness 82 yo female s/p L DA-THA on 12/08/17. PMH includes pyelonephritis, chronic hypoxemic respiratory failure, SOB, HTN, UTI, R THA 2016, spinal stenosis, depression, anxiety, COPD on 2LO2 at night, R hip closed reduction 2017, SOB.     PT Comments     The patient ambulated to BR using 4 wheeled RW. Patient may do better and safer with 2 wheeled RW.. Will assess next visit.    Follow Up Recommendations  Supervision for mobility/OOB;Home health PT     Equipment Recommendations  None recommended by PT    Recommendations for Other Services       Precautions / Restrictions Precautions Precautions: Fall Restrictions LLE Weight Bearing: Weight bearing as tolerated    Mobility  Bed Mobility   Bed Mobility: Supine to Sit     Supine to sit: Max assist;HOB elevated     General bed mobility comments: in recliner  Transfers Overall transfer level: Needs assistance Equipment used: 4-wheeled walker Transfers: Sit to/from Stand Sit to Stand: Mod assist         General transfer comment: multimodal cues for safety and unlocking brakes. patient with very little weight on left leg to stand up. Steady assist for standing.  Ambulation/Gait Ambulation/Gait assistance: Mod assist;+2 safety/equipment Gait Distance (Feet): 20 Feet Assistive device: 4-wheeled walker Gait Pattern/deviations: Step-to pattern     General Gait Details: cues for safety, assistance to get around [facility] in bathroom. cues  to stay more inside RW.   Stairs             Wheelchair Mobility    Modified Rankin (Stroke Patients Only)       Balance                                            Cognition Arousal/Alertness: Awake/alert Behavior During Therapy: Anxious                                   General Comments:  less anxious      Exercises Total Joint Exercises Ankle Circles/Pumps: AROM;10 reps;Both Short Arc Quad: AAROM;Left;10 reps Heel Slides: AAROM;Left;10 reps;Supine Hip ABduction/ADduction: AAROM;Left;10 reps;Supine    General Comments        Pertinent Vitals/Pain Pain Score: 7  Pain Location: left thigh Pain Descriptors / Indicators: Tightness;Grimacing;Guarding;Cramping;Sharp;Shooting;Jabbing Pain Intervention(s): Monitored during session;Premedicated before session    Home Living                      Prior Function            PT Goals (current goals can now be found in the care plan section) Progress towards PT goals: Progressing toward goals    Frequency    7X/week      PT Plan Current plan remains appropriate    Co-evaluation              AM-PAC PT "6 Clicks" Daily Activity  Outcome Measure  Difficulty turning over in bed (including adjusting bedclothes, sheets and blankets)?: Unable Difficulty moving from lying on back to sitting on the side of the bed? : Unable Difficulty sitting down on and standing up from a chair with arms (e.g.,  wheelchair, bedside commode, etc,.)?: Unable Help needed moving to and from a bed to chair (including a wheelchair)?: Total Help needed walking in hospital room?: A Lot Help needed climbing 3-5 steps with a railing? : Total 6 Click Score: 6    End of Session Equipment Utilized During Treatment: Gait belt Activity Tolerance: Patient tolerated treatment well Patient left: (in BR with OT) Nurse Communication: Mobility status PT Visit Diagnosis: Other abnormalities of gait and mobility (R26.89);Difficulty in walking, not elsewhere classified (R26.2)     Time: 2182-8833 PT Time Calculation (min) (ACUTE ONLY): 8 min  Charges:  $Gait Training: 8-22 mins                   Claretha Cooper 12/09/2017, 11:07 AM

## 2017-12-10 ENCOUNTER — Encounter (HOSPITAL_COMMUNITY): Payer: Self-pay | Admitting: Orthopedic Surgery

## 2017-12-10 LAB — CBC
HCT: 32 % — ABNORMAL LOW (ref 36.0–46.0)
Hemoglobin: 9.9 g/dL — ABNORMAL LOW (ref 12.0–15.0)
MCH: 28.4 pg (ref 26.0–34.0)
MCHC: 30.9 g/dL (ref 30.0–36.0)
MCV: 91.7 fL (ref 80.0–100.0)
Platelets: 205 10*3/uL (ref 150–400)
RBC: 3.49 MIL/uL — ABNORMAL LOW (ref 3.87–5.11)
RDW: 14.5 % (ref 11.5–15.5)
WBC: 13.3 10*3/uL — ABNORMAL HIGH (ref 4.0–10.5)
nRBC: 0 % (ref 0.0–0.2)

## 2017-12-10 LAB — BASIC METABOLIC PANEL
Anion gap: 6 (ref 5–15)
BUN: 12 mg/dL (ref 8–23)
CO2: 25 mmol/L (ref 22–32)
Calcium: 8.8 mg/dL — ABNORMAL LOW (ref 8.9–10.3)
Chloride: 109 mmol/L (ref 98–111)
Creatinine, Ser: 0.6 mg/dL (ref 0.44–1.00)
GFR calc Af Amer: >60 mL/min (ref >60)
GFR calc non Af Amer: >60 mL/min (ref >60)
Glucose, Bld: 105 mg/dL — ABNORMAL HIGH (ref 70–99)
Potassium: 3.8 mmol/L (ref 3.5–5.1)
Sodium: 140 mmol/L (ref 135–145)

## 2017-12-10 NOTE — Progress Notes (Signed)
Discharge paperwork discussed with pt and friend at the bedside.  They demonstrated understanding.  Pt was escorted in stable condition by wheelchair to main lobby.

## 2017-12-10 NOTE — Progress Notes (Signed)
     Subjective: 2 Days Post-Op Procedure(s) (LRB): LEFT TOTAL HIP ARTHROPLASTY ANTERIOR APPROACH (Left)   Patient reports pain as mild, pain controlled. No events throughout the night.  Didn't progress well with PT yesterday, hopeful she will do better today.  Ready to be discharged home if she does well with PT.    Objective:   VITALS:   Vitals:   12/09/17 2159 12/10/17 0530  BP: 127/61 124/65  Pulse: 81 81  Resp: 16 16  Temp: 97.8 F (36.6 C) 98 F (36.7 C)  SpO2: 93% 91%    Dorsiflexion/Plantar flexion intact Incision: dressing C/D/I No cellulitis present Compartment soft  LABS Recent Labs    12/09/17 0531 12/10/17 0559  HGB 10.9* 9.9*  HCT 34.6* 32.0*  WBC 12.2* 13.3*  PLT 207 205    Recent Labs    12/09/17 0531 12/10/17 0559  NA 139 140  K 4.0 3.8  BUN 11 12  CREATININE 0.67 0.60  GLUCOSE 148* 105*     Assessment/Plan: 2 Days Post-Op Procedure(s) (LRB): LEFT TOTAL HIP ARTHROPLASTY ANTERIOR APPROACH (Left) Up with therapy Discharge home Follow up in 2 weeks at [name] Follow up with [name],[name] in 2 weeks.  Contact information:  [facility]) [address], [address] [place]. Priscilla Cantrell   PAC  12/10/2017, 7:27 AM

## 2017-12-10 NOTE — Progress Notes (Signed)
Occupational Therapy Treatment Patient Details Name: Priscilla Cantrell MRN: [id number] DOB: 1935-04-02 Today's Date: 12/10/2017    History of present illness 82 yo female s/p L DA-THA on 12/08/17. PMH includes pyelonephritis, chronic hypoxemic respiratory failure, SOB, HTN, UTI, R THA 2016, spinal stenosis, depression, anxiety, COPD on 2LO2 at night, R hip closed reduction 2017, SOB.    OT comments  Ambulated with min guard to bathroom; min A for transfer. Assisted with adl. Pt is familiar with AE and will have help at home  Follow Up Recommendations  Supervision/Assistance - 24 hour    Equipment Recommendations  None recommended by OT    Recommendations for Other Services      Precautions / Restrictions Precautions Precautions: Fall Precaution Comments: HOME O2 2 lts  Restrictions LLE Weight Bearing: Weight bearing as tolerated       Mobility Bed Mobility         Supine to sit: Min assist     General bed mobility comments: assist for LLE  Transfers   Equipment used: Rolling walker (2 wheeled)   Sit to Stand: Min assist         General transfer comment: light assist from high bed; 25% help from comfort height commode    Balance                                           ADL either performed or assessed with clinical judgement   ADL       Grooming: Oral care;Supervision/safety;Standing       Lower Body Bathing: Minimal assistance;Moderate assistance;Sit to/from stand           Toilet Transfer: Minimal assistance;Transfer board;Comfort height toilet;RW   Toileting- Clothing Manipulation and Hygiene: Minimal assistance;Sit to/from stand         General ADL Comments: ambulated to bathroom for toileting and completed adl from toilet level to standing     Vision       Perception     Praxis      Cognition Arousal/Alertness: Awake/alert Behavior During Therapy: WFL for tasks assessed/performed Overall Cognitive Status:  Within Functional Limits for tasks assessed                                          Exercises     Shoulder Instructions       General Comments sats remained at 91% on 2 liters throughout tx    Pertinent Vitals/ Pain       Pain Score: 4  Pain Location: L hip Pain Descriptors / Indicators: Sore;Discomfort;Operative site guarding Pain Intervention(s): Limited activity within patient's tolerance;Monitored during session;Repositioned(NT to bring ice)  Home Living                                          Prior Functioning/Environment              Frequency           Progress Toward Goals  OT Goals(current goals can now be found in the care plan section)  Progress towards OT goals: Progressing toward goals     Plan      Co-evaluation  AM-PAC PT "6 Clicks" Daily Activity     Outcome Measure   Help from another person eating meals?: None Help from another person taking care of personal grooming?: A Little Help from another person toileting, which includes using toliet, bedpan, or urinal?: A Little Help from another person bathing (including washing, rinsing, drying)?: A Lot Help from another person to put on and taking off regular upper body clothing?: A Little Help from another person to put on and taking off regular lower body clothing?: A Lot 6 Click Score: 17    End of Session    OT Visit Diagnosis: Pain Pain - Right/Left: Left Pain - part of body: Hip   Activity Tolerance     Patient Left     Nurse Communication          Time: 2103-1281 OT Time Calculation (min): 33 min  Charges: OT General Charges $OT Visit: 1 Visit OT Treatments $Self Care/Home Management : 23-37 mins  Lesle Chris, OTR/L Acute Rehabilitation Services [phone] [facility] pager [phone] office 12/10/2017   [place] 12/10/2017, 9:07 AM

## 2017-12-10 NOTE — Progress Notes (Signed)
Physical Therapy Treatment Patient Details Name: Priscilla Cantrell MRN: [id number] DOB: [birth year] Today's Date: 12/10/2017    History of Present Illness 82 yo female s/p L DA-THA on 12/08/17. PMH includes pyelonephritis, chronic hypoxemic respiratory failure, SOB, HTN, UTI, R THA 2016, spinal stenosis, depression, anxiety, COPD on 2LO2 at night, R hip closed reduction 2017, SOB.     PT Comments    POD # 2 Pt feeling and performing better to D/C to home today.   Follow Up Recommendations  Supervision for mobility/OOB;Home health PT;Supervision/Assistance - 24 hour     Equipment Recommendations  None recommended by PT    Recommendations for Other Services       Precautions / Restrictions Precautions Precautions: Fall Precaution Comments: HOME O2 2 lts  Restrictions Weight Bearing Restrictions: No LLE Weight Bearing: Weight bearing as tolerated    Mobility  Bed Mobility               General bed mobility comments: OOB in recliner   Transfers Overall transfer level: Needs assistance Equipment used: Rolling walker (2 wheeled) Transfers: Sit to/from [facility] Sit to Stand: Supervision;Min guard Stand pivot transfers: Supervision;Min guard       General transfer comment: <25% VC's on safety with turns  Ambulation/Gait Ambulation/Gait assistance: Min assist Gait Distance (Feet): 45 Feet Assistive device: Rolling walker (2 wheeled) Gait Pattern/deviations: Step-to pattern;Step-through pattern Gait velocity: decreased    General Gait Details: tolerated an increased distance with decreased unsteadiness.  remained on 2 lts nasal avg sats 92%.   Stairs Stairs: Yes Stairs assistance: Min assist Stair Management: Two rails;Forwards Number of Stairs: 2 General stair comments: only required one VC on proper tech and sequencing   Wheelchair Mobility    Modified Rankin (Stroke Patients Only)       Balance                                             Cognition Arousal/Alertness: Awake/alert Behavior During Therapy: WFL for tasks assessed/performed Overall Cognitive Status: Within Functional Limits for tasks assessed                                 General Comments: feeling better      Exercises      General Comments        Pertinent Vitals/Pain Pain Assessment: Faces Faces Pain Scale: Hurts little more Pain Location: L hip Pain Descriptors / Indicators: Sore;Discomfort;Operative site guarding Pain Intervention(s): Monitored during session;Repositioned;Ice applied    Home Living                      Prior Function            PT Goals (current goals can now be found in the care plan section) Progress towards PT goals: Progressing toward goals    Frequency    7X/week      PT Plan Current plan remains appropriate    Co-evaluation              AM-PAC PT "6 Clicks" Daily Activity  Outcome Measure    Difficulty moving from lying on back to sitting on the side of the bed? : A Little Difficulty sitting down on and standing up from a chair with arms (e.g., wheelchair, bedside commode, etc,.)?:  A Little Help needed moving to and from a bed to chair (including a wheelchair)?: A Little Help needed walking in hospital room?: A Little Help needed climbing 3-5 steps with a railing? : A Little 6 Click Score: 15    End of Session Equipment Utilized During Treatment: Gait belt Activity Tolerance: Patient tolerated treatment well Patient left: in chair Nurse Communication: (pt ready for D/C to home) PT Visit Diagnosis: Other abnormalities of gait and mobility (R26.89);Difficulty in walking, not elsewhere classified (R26.2)     Time: 4580-9983 PT Time Calculation (min) (ACUTE ONLY): 32 min  Charges:  $Gait Training: 8-22 mins $Therapeutic Activity: 8-22 mins                     Rica Koyanagi  PTA Acute  Rehabilitation Services Pager       [phone] Office      [phone]

## 2017-12-14 IMAGING — DX DG PORTABLE PELVIS
1 series · 1 of 1 positions shown · non-contrast
Comparison: Plain films the right hip earlier today.

CLINICAL DATA: Status post reduction of right hip dislocation which
occurred day.

EXAM:
PORTABLE PELVIS 1-2 VIEWS

[pelvis ap]
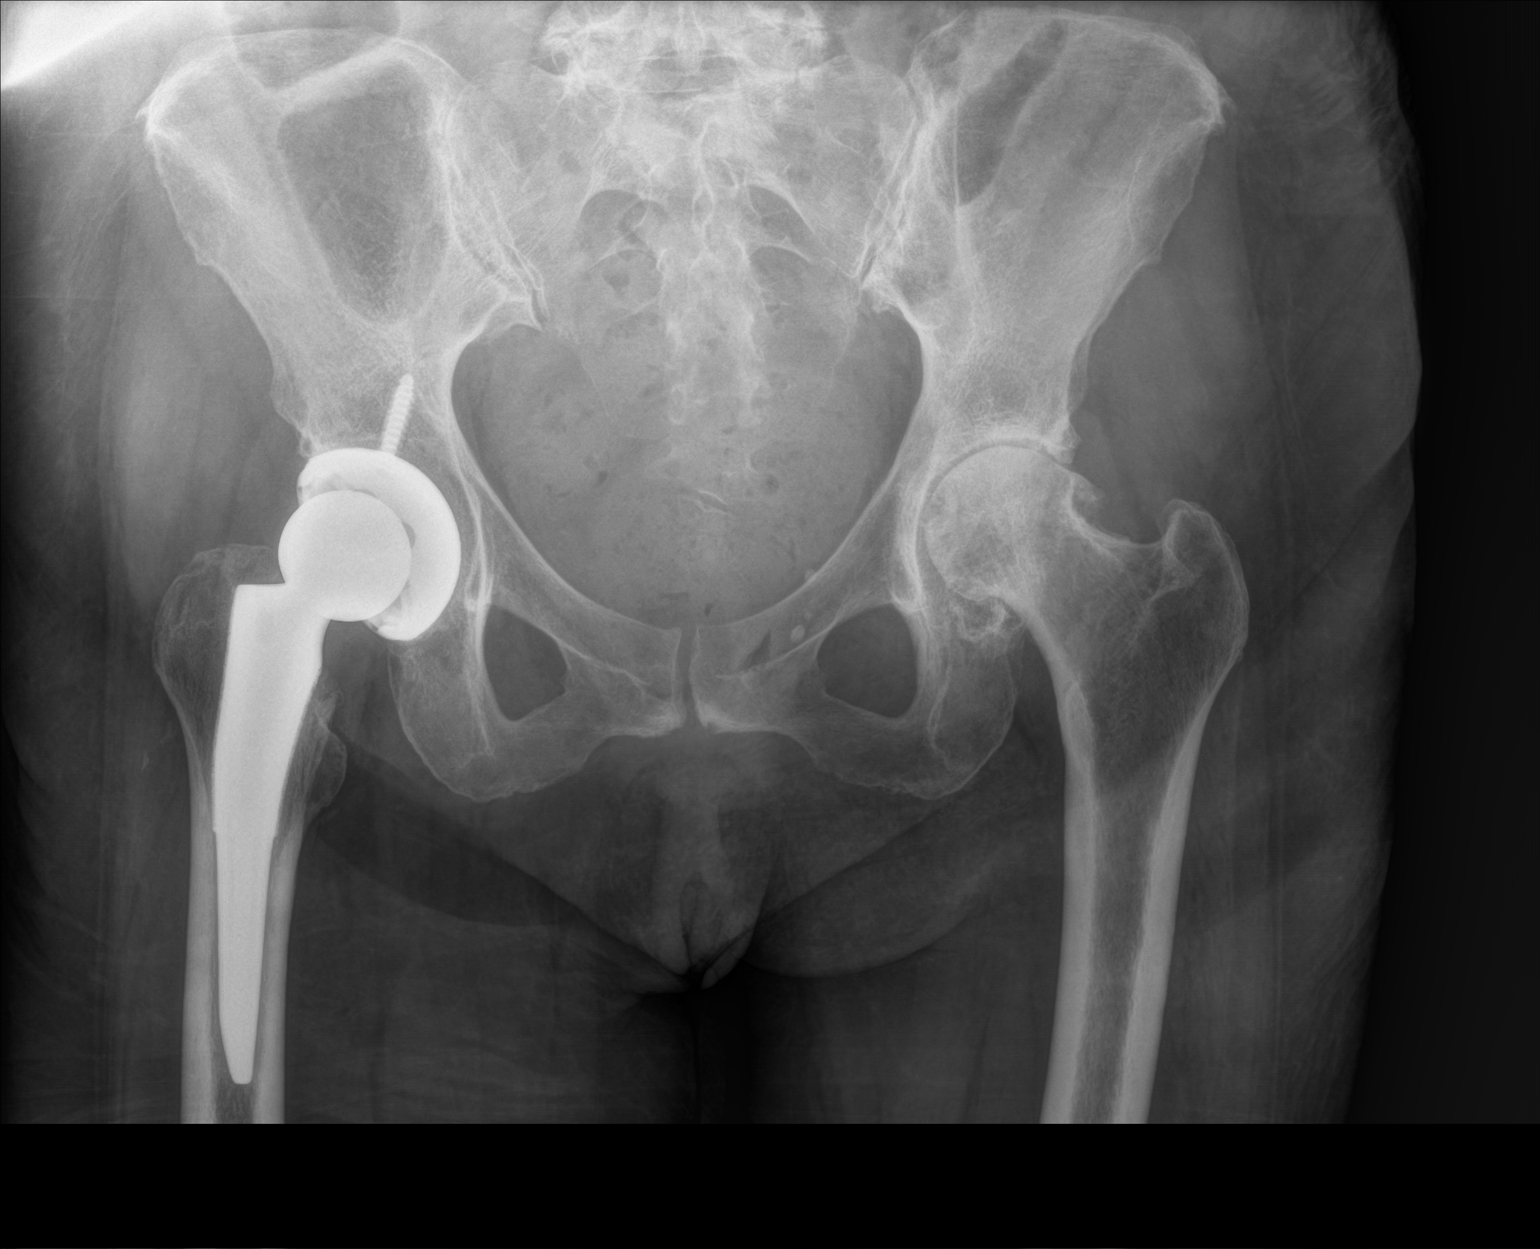

[1 of 1 positions shown; findings below may reference images not displayed]

FINDINGS: Right hip arthroplasty has been reduced. No new abnormality is
identified. Advanced left hip osteoarthritis noted.
IMPRESSION: Successful reduction of right hip dislocation.

Advanced left hip osteoarthritis.

## 2017-12-15 NOTE — Discharge Summary (Signed)
Physician Discharge Summary  Patient ID: Priscilla Cantrell MRN: [id number] DOB/AGE: 1935/05/20 82 y.o.  Admit date: 12/08/2017 Discharge date: 12/10/2017   Procedures:  Procedure(s) (LRB): LEFT TOTAL HIP ARTHROPLASTY ANTERIOR APPROACH (Left)  Attending Physician:  Dr. Paralee Cantrell   Admission Diagnoses:   Left hip primary OA / pain  Discharge Diagnoses:  Principal Problem:   S/P left THA, AA Active Problems:   Overweight (BMI 25.0-29.9)    HPI:    Priscilla Cantrell, 82 y.o. female, has a history of pain and functional disability in the left hip(s) due to arthritis and patient has failed non-surgical conservative treatments for greater than 12 weeks to include NSAID's and/or analgesics, use of assistive devices and activity modification.  Onset of symptoms was gradual starting 3-4 years ago with gradually worsening course since that time.The patient noted prior procedures of the hip to include arthroplasty on the right hip by Dr. Gladstone Cantrell.  Patient currently rates pain in the left hip at 9 out of 10 with activity. Patient has night pain, worsening of pain with activity and weight bearing, trendelenberg gait, pain that interfers with activities of daily living and pain with passive range of motion. Patient has evidence of periarticular osteophytes and joint space narrowing by imaging studies. This condition presents safety issues increasing the risk of falls. There is no current active infection.  Risks, benefits and expectations were discussed with the patient.  Risks including but not limited to the risk of anesthesia, blood clots, nerve damage, blood vessel damage, failure of the prosthesis, infection and up to and including death.  Patient understand the risks, benefits and expectations and wishes to proceed with surgery.   PCP: Priscilla Sou, MD   Discharged Condition: good  Hospital Course:  Patient underwent the above stated procedure on 12/08/2017. Patient tolerated the procedure  well and brought to the recovery room in good condition and subsequently to the floor.  POD #1 BP: 136/66 ; Pulse: 76 ; Temp: 98 F (36.7 C) ; Resp: 15 Patient reports pain as mild, pain controlled.  No events throughout the night. Encouragement offered that she will do well with recovery.  Looking forward to recovering. Ended up not doing great with PT and needed additional day / sessions to make sure she was safe to be discharged home. Dorsiflexion/plantar flexion intact, incision: dressing C/D/I, no cellulitis present and compartment soft.   LABS  Basename    HGB     10.9  HCT     34.6   POD #2  BP: 124/65 ; Pulse: 81 ; Temp: 98 F (36.7 C) ; Resp: 16 Patient reports pain as mild, pain controlled. No events throughout the night.  Didn't progress well with PT yesterday, hopeful she will do better today.  Ready to be discharged home  LABS  Basename    HGB     9.9  HCT     32.0    Discharge Exam: General appearance: alert, cooperative and no distress Extremities: Homans sign is negative, no sign of DVT, no edema, redness or tenderness in the calves or thighs and no ulcers, gangrene or trophic changes  Disposition:  Home with follow up in 2 weeks   Follow-up Information    Priscilla Cancel, MD. Schedule an appointment as soon as possible for a visit in 2 weeks.   Specialty:  Orthopedic Surgery Contact information: [address] [place] [address] [phone]           Discharge  Instructions    Call MD / Call 911   Complete by:  As directed    If you experience chest pain or shortness of breath, CALL 911 and be transported to the hospital emergency room.  If you develope a fever above 101 F, pus (white drainage) or increased drainage or redness at the wound, or calf pain, call your surgeon's office.   Change dressing   Complete by:  As directed    Maintain surgical dressing until follow up in the clinic. If the edges start to pull up, may reinforce with  tape. If the dressing is no longer working, may remove and cover with gauze and tape, but must keep the area dry and clean.  Call with any questions or concerns.   Constipation Prevention   Complete by:  As directed    Drink plenty of fluids.  Prune juice may be helpful.  You may use a stool softener, such as Colace (over the counter) 100 mg twice a day.  Use MiraLax (over the counter) for constipation as needed.   Diet - low sodium heart healthy   Complete by:  As directed    Discharge instructions   Complete by:  As directed    Maintain surgical dressing until follow up in the clinic. If the edges start to pull up, may reinforce with tape. If the dressing is no longer working, may remove and cover with gauze and tape, but must keep the area dry and clean.  Follow up in 2 weeks at [facility]. Call with any questions or concerns.   Increase activity slowly as tolerated   Complete by:  As directed    Weight bearing as tolerated with assist device (walker, cane, etc) as directed, use it as long as suggested by your surgeon or therapist, typically at least 4-6 weeks.   TED hose   Complete by:  As directed    Use stockings (TED hose) for 2 weeks on both leg(s).  You may remove them at night for sleeping.      Allergies as of 12/10/2017      Reactions   Losartan Other (See Comments)   hyperkalemia   Penicillins Hives, Swelling, Rash   Has patient had a PCN reaction causing immediate rash, facial/tongue/throat swelling, SOB or lightheadedness with hypotension: YES Has patient had a PCN reaction causing severe rash involving mucus membranes or skin necrosis: NO Has patient had a PCN reaction that required hospitalization NO Has patient had a PCN reaction occurring within the last 10 years: NO If all of the above answers are "NO", then may proceed with Cephalosporin use.   Cefixime [kdc:cefixime] Other (See Comments)   unspecified   Indocin [indomethacin] Other (See Comments)    Painful tongue and throat   Singulair [montelukast Sodium]    Chest tightness    Sulfa Antibiotics Other (See Comments)   unspecified   Ace Inhibitors Other (See Comments)   cough   Statins Other (See Comments)   Myalgias      Medication List    STOP taking these medications   acetaminophen 500 MG tablet Commonly known as:  TYLENOL   aspirin EC 81 MG tablet Replaced by:  aspirin 81 MG chewable tablet   estradiol 0.1 MG/GM vaginal cream Commonly known as:  ESTRACE     TAKE these medications   albuterol 108 (90 Base) MCG/ACT inhaler Commonly known as:  PROVENTIL HFA;VENTOLIN HFA INHALE 2 PUFFS BY MOUTH EVERY 4 HOURS AS NEEDED What changed:  reasons to take this   albuterol (2.5 MG/3ML) 0.083% nebulizer solution Commonly known as:  PROVENTIL INHALE 1 VIAL VIA NEBULIZER EVERY 6 HOURS AS NEEDED FOR WHEEZING OR SHORTNESS OF BREATH What changed:  Another medication with the same name was changed. Make sure you understand how and when to take each.   amLODipine 5 MG tablet Commonly known as:  NORVASC TAKE 1 TABLET BY MOUTH EVERY DAY   aspirin 81 MG chewable tablet Chew 1 tablet (81 mg total) by mouth 2 (two) times daily. Take for 4 weeks, then resume regular dose. Replaces:  aspirin EC 81 MG tablet   azelastine 0.1 % nasal spray Commonly known as:  ASTELIN Place 2 sprays into both nostrils 2 (two) times daily. Use in each nostril as directed What changed:    when to take this  reasons to take this   diazepam 5 MG tablet Commonly known as:  VALIUM TAKE 1 TABLET BY MOUTH EVERY MORNING AND 2 TABLETS EVERY NIGHT AT BEDTIME What changed:  See the new instructions.   diphenhydrAMINE 25 MG tablet Commonly known as:  BENADRYL Take 25 mg by mouth 3 (three) times daily as needed for allergies.   docusate sodium 100 MG capsule Commonly known as:  COLACE Take 1 capsule (100 mg total) by mouth 2 (two) times daily.   ferrous sulfate 325 (65 FE) MG tablet Take 1 tablet (325  mg total) by mouth 3 (three) times daily with meals.   fluticasone 50 MCG/ACT nasal spray Commonly known as:  FLONASE Place 1 spray into both nostrils daily. What changed:    when to take this  reasons to take this   guaiFENesin 600 MG 12 hr tablet Commonly known as:  MUCINEX Take 600 mg by mouth 2 (two) times daily as needed for to loosen phlegm.   HYDROcodone-acetaminophen 7.5-325 MG tablet Commonly known as:  NORCO Take 1-2 tablets by mouth every 4 (four) hours as needed for moderate pain.   HYDROcodone-acetaminophen 7.5-325 MG tablet Commonly known as:  NORCO Take 1-2 tablets by mouth every 4 (four) hours as needed for moderate pain.   hydrocortisone cream 1 % Apply 1 application topically daily as needed for itching.   LACTAID FAST ACT 9000 units Tabs Generic drug:  Lactase Take 9,000 Units by mouth daily as needed (dairy).   methocarbamol 500 MG tablet Commonly known as:  ROBAXIN Take 1 tablet (500 mg total) by mouth every 6 (six) hours as needed for muscle spasms.   OMEGA-3 FISH OIL PO Take 2,000 mg by mouth daily.   polyethylene glycol packet Commonly known as:  MIRALAX / GLYCOLAX Take 17 g by mouth 2 (two) times daily.   PROBIOTIC DAILY PO Take 1 capsule by mouth daily.   Roflumilast 250 MCG Tabs Take 1 tablet by mouth daily. What changed:  how much to take   SOOTHE XP Soln Place 1 drop into both eyes 2 (two) times daily as needed (irritation).   SPIRIVA HANDIHALER 18 MCG inhalation capsule Generic drug:  tiotropium INHALE 1 CAPSULE VIA HANDIHALER EVERY DAY AT THE SAME TIME What changed:  See the new instructions.   SYNTHROID 88 MCG tablet Generic drug:  levothyroxine TAKE 1 TABLET BY MOUTH DAILY   triamcinolone cream 0.1 % Commonly known as:  KENALOG Apply 1 application topically 2 (two) times daily. What changed:    when to take this  reasons to take this   venlafaxine 50 MG tablet Commonly known as:  EFFEXOR TAKE 1  TABLET(50 MG) BY  MOUTH TWICE DAILY   WIXELA INHUB 250-50 MCG/DOSE Aepb Generic drug:  Fluticasone-Salmeterol INHALE 1 PUFF INTO THE LUNGS TWICE DAILY What changed:  See the new instructions.   XIIDRA 5 % Soln Generic drug:  Lifitegrast Place 1 drop into both eyes 2 (two) times daily.            Discharge Care Instructions  (From admission, onward)         Start     Ordered   12/09/17 0000  Change dressing    Comments:  Maintain surgical dressing until follow up in the clinic. If the edges start to pull up, may reinforce with tape. If the dressing is no longer working, may remove and cover with gauze and tape, but must keep the area dry and clean.  Call with any questions or concerns.   12/09/17 [id number]           Signed: West Pugh. Meerab Maselli   PA-C  12/15/2017, 3:06 PM

## 2017-12-18 ENCOUNTER — Other Ambulatory Visit: Payer: Self-pay | Admitting: Pulmonary Disease

## 2017-12-21 ENCOUNTER — Ambulatory Visit: Payer: Medicare Other | Admitting: Family Medicine

## 2017-12-22 ENCOUNTER — Ambulatory Visit: Payer: Self-pay

## 2017-12-22 NOTE — Telephone Encounter (Signed)
Patient called, home number rang busy, left VM on mobile number to return call to the office to discuss her hand.

## 2017-12-22 NOTE — Telephone Encounter (Signed)
I called the patient back and she says that she had a nurse friend and the fire chief to look at her hand. The nurse friend put some cream on it and wrapped it loosely. The patient says her hand is not hurting now. She says I have an appointment tomorrow for my hip replacement follow up and I'll have them look at it tomorrow. I asked is the hand blistered now, she says it is wrapped and she didn't want to take the wrapping off. I advised to call us back if she needs to.  Patient call to [facility]: Patient states she was heating up a sausage biscuit and she opened the door of it and and grease got on the palm of her left hand. She is keeping it in cold water but is afraid it is going to blister. She did have the sheriff dept come and look at it. She wants to know is there a time frame she needs to have it looked at?

## 2018-01-04 ENCOUNTER — Ambulatory Visit (INDEPENDENT_AMBULATORY_CARE_PROVIDER_SITE_OTHER): Payer: Medicare Other | Admitting: Family Medicine

## 2018-01-04 ENCOUNTER — Other Ambulatory Visit: Payer: Self-pay

## 2018-01-04 ENCOUNTER — Ambulatory Visit: Payer: Medicare Other | Admitting: Pulmonary Disease

## 2018-01-04 ENCOUNTER — Encounter: Payer: Self-pay | Admitting: Family Medicine

## 2018-01-04 VITALS — BP 138/84 | HR 84 | Temp 98.4°F | Ht 60.0 in | Wt 144.4 lb

## 2018-01-04 DIAGNOSIS — R300 Dysuria: Secondary | ICD-10-CM | POA: Diagnosis not present

## 2018-01-04 LAB — POCT URINALYSIS DIPSTICK
Bilirubin, UA: NEGATIVE
Glucose, UA: NEGATIVE
Ketones, UA: NEGATIVE
Leukocytes, UA: NEGATIVE
Nitrite, UA: NEGATIVE
Protein, UA: POSITIVE — AB
Spec Grav, UA: 1.02 (ref 1.010–1.025)
Urobilinogen, UA: 1 E.U./dL
pH, UA: 6 (ref 5.0–8.0)

## 2018-01-04 MED ORDER — CIPROFLOXACIN HCL 250 MG PO TABS: 250.0000 mg | ORAL_TABLET | Freq: Two times a day (BID) | ORAL | 0 refills | Status: DC

## 2018-01-04 NOTE — Progress Notes (Signed)
Meda Coffee

## 2018-01-04 NOTE — Progress Notes (Signed)
Subjective   CC:  Chief Complaint  Patient presents with  . Dysuria    and frequency since this morning     HPI: Priscilla Cantrell is a 82 y.o. female who presents to the office today to address the problems listed above in the chief complaint. Same day acute visit; PCP not available. New pt to me. Chart reviewed.    Patient reports dysuria and urinary frequency.  She denies the sensation of increased urinary pressure.  She denies fevers flank pain nausea vomiting or gross hematuria.  Symptoms have been present for several hours. However, she did not have dysuria with giving her sample this afternoon.  She denies history of interstitial cystitis.  She denies vaginal symptoms including vaginal discharge or pelvic pain. She is 1 months post op from left THR. She reports h/o recurrent UTI last year treated with several rounds of abx and resulting in c.diff colitis. She had cefazolin in preop but no other abx for > 6 months by chart review. No diarrhea. Eating ok.  Assessment  1. Dysuria      Plan   Possible UTI but unclear. Check culture. Education and counseling given. Pt prefers starting abx and awaiting urine culture results. Will f/u if worsens or persists. She appears well today.   Follow up: Return if symptoms worsen or fail to improve.  Orders Placed This Encounter  Procedures  . Urine Culture  . POCT Urinalysis Dipstick   Meds ordered this encounter  Medications  . ciprofloxacin (CIPRO) 250 MG tablet    Sig: Take 1 tablet (250 mg total) by mouth 2 (two) times daily.    Dispense:  6 tablet    Refill:  0      I reviewed the patients updated PMH, FH, and SocHx.    Patient Active Problem List   Diagnosis Date Noted  . Overweight (BMI 25.0-29.9) 12/09/2017  . S/P left KA 12/08/2017  . S/P left THA, AA 12/08/2017  . Postherpetic neuralgia 03/07/2015  . Constipation 02/28/2015  . Renal mass 02/28/2015  . Acute pyelonephritis 02/20/2015  . Oral thrush 02/08/2015  .  Chronic hypoxemic respiratory failure ([place]) 02/02/2015  . SOB (shortness of breath)   . Essential hypertension 01/24/2015  . UTI (urinary tract infection) 01/09/2015  . Pain and swelling of right lower leg 01/09/2015  . H/O total hip arthroplasty 11/01/2014  . Spinal stenosis, lumbar region, with neurogenic claudication 01/04/2014  . Osteoarthritis, hip, bilateral 07/27/2013  . Sciatica associated with disorder of lumbosacral spine 04/19/2013  . Atrophic vaginitis 01/12/2012  . Periodic limb movement disorder 11/24/2011  . Hypoxia 05/22/2011  . Depression with anxiety 04/15/2011  . Chronic venous insufficiency 07/29/2010  . Hypothyroidism due to Hashimoto's thyroiditis 01/07/2010  . SPINAL STENOSIS 01/07/2010  . Allergic rhinitis 08/09/2007  . COPD GOLD III B 08/09/2007   Current Meds  Medication Sig  . albuterol (PROVENTIL HFA;VENTOLIN HFA) 108 (90 Base) MCG/ACT inhaler INHALE 2 PUFFS BY MOUTH EVERY 4 HOURS AS NEEDED (Patient taking differently: Inhale 2 puffs into the lungs every 4 (four) hours as needed for wheezing or shortness of breath. )  . albuterol (PROVENTIL) (2.5 MG/3ML) 0.083% nebulizer solution INHALE 1 VIAL VIA NEBULIZER EVERY 6 HOURS AS NEEDED FOR WHEEZING OR SHORTNESS OF BREATH  . amLODipine (NORVASC) 5 MG tablet TAKE 1 TABLET BY MOUTH EVERY DAY  . Artificial Tear Solution (SOOTHE XP) SOLN Place 1 drop into both eyes 2 (two) times daily as needed (irritation).  Marland Kitchen aspirin (ASPIRIN  CHILDRENS) 81 MG chewable tablet Chew 1 tablet (81 mg total) by mouth 2 (two) times daily. Take for 4 weeks, then resume regular dose.  Marland Kitchen azelastine (ASTELIN) 0.1 % nasal spray Place 2 sprays into both nostrils 2 (two) times daily. Use in each nostril as directed (Patient taking differently: Place 2 sprays into both nostrils 2 (two) times daily as needed for rhinitis or allergies. Use in each nostril as directed)  . diazepam (VALIUM) 5 MG tablet TAKE 1 TABLET BY MOUTH EVERY MORNING AND 2 TABLETS  EVERY NIGHT AT BEDTIME (Patient taking differently: Take 5 mg by mouth at bedtime. May take an additional 5 mg dose as needed for anxiety)  . diphenhydrAMINE (BENADRYL) 25 MG tablet Take 25 mg by mouth 3 (three) times daily as needed for allergies.  Marland Kitchen HYDROcodone-acetaminophen (NORCO) 7.5-325 MG tablet Take 1-2 tablets by mouth every 4 (four) hours as needed for moderate pain.  . hydrocortisone cream 1 % Apply 1 application topically daily as needed for itching.  . Lactase (LACTAID FAST ACT) 9000 units TABS Take 9,000 Units by mouth daily as needed (dairy).  Marland Kitchen Lifitegrast (XIIDRA) 5 % SOLN Place 1 drop into both eyes 2 (two) times daily.  . methocarbamol (ROBAXIN) 500 MG tablet Take 1 tablet (500 mg total) by mouth every 6 (six) hours as needed for muscle spasms.  . Probiotic Product (PROBIOTIC DAILY PO) Take 1 capsule by mouth daily.   . [DISCONTINUED] docusate sodium (COLACE) 100 MG capsule Take 1 capsule (100 mg total) by mouth 2 (two) times daily.  . [DISCONTINUED] HYDROcodone-acetaminophen (NORCO) 7.5-325 MG tablet Take 1-2 tablets by mouth every 4 (four) hours as needed for moderate pain.    Review of Systems: Cardiovascular: negative for chest pain Respiratory: negative for SOB or persistent cough Gastrointestinal: negative for abdominal pain Constitutional: Negative for fever malaise or anorexia  Objective  Vitals: BP 138/84   Pulse 84   Temp 98.4 F (36.9 C)   Ht 5' (1.524 m)   Wt 144 lb 6.4 oz (65.5 kg)   SpO2 98%   BMI 28.20 kg/m  General: no acute distress  Psych:  Alert and oriented, normal mood and affect Cardiovascular:  RRR Respiratory:  Good breath sounds bilaterally, CTAB with normal respiratory effort Gastrointestinal: soft, flat abdomen, normal active bowel sounds, no palpable masses, no hepatosplenomegaly, no appreciated hernias, NO CVAT, mild suprapubic ttp w/o rebound or guarding Skin:  Warm, no rashes Neurologic:   Mental status is normal. normal gait Office  Visit on 01/04/2018  Component Date Value Ref Range Status  . Color, UA 01/04/2018 amber   Final  . Clarity, UA 01/04/2018 cloudy   Final  . Glucose, UA 01/04/2018 Negative  Negative Final  . Bilirubin, UA 01/04/2018 negative   Final  . Ketones, UA 01/04/2018 negative   Final  . Spec Grav, UA 01/04/2018 1.020  1.010 - 1.025 Final  . Blood, UA 01/04/2018 2+   Final  . pH, UA 01/04/2018 6.0  5.0 - 8.0 Final  . Protein, UA 01/04/2018 Positive* Negative Final  . Urobilinogen, UA 01/04/2018 1.0  0.2 or 1.0 E.U./dL Final  . Nitrite, UA 01/04/2018 negative   Final  . Leukocytes, UA 01/04/2018 Negative  Negative Final    Commons side effects, risks, benefits, and alternatives for medications and treatment plan prescribed today were discussed, and the patient expressed understanding of the given instructions. Patient is instructed to call or message via MyChart if he/she has any questions or concerns regarding  our treatment plan. No barriers to understanding were identified. We discussed Red Flag symptoms and signs in detail. Patient expressed understanding regarding what to do in case of urgent or emergency type symptoms.   Medication list was reconciled, printed and provided to the patient in AVS. Patient instructions and summary information was reviewed with the patient as documented in the AVS. This note was prepared with assistance of Dragon voice recognition software. Occasional wrong-word or sound-a-like substitutions may have occurred due to the inherent limitations of voice recognition software

## 2018-01-04 NOTE — Patient Instructions (Addendum)
Please follow up if symptoms do not improve or as needed.   We will call you with your urine culture results.   Urinary Tract Infection, Adult A urinary tract infection (UTI) is an infection of any part of the urinary tract. The urinary tract includes the:  Kidneys.  Ureters.  Bladder.  Urethra.  These organs make, store, and get rid of pee (urine) in the body. Follow these instructions at home:  Take over-the-counter and prescription medicines only as told by your doctor.  If you were prescribed an antibiotic medicine, take it as told by your doctor. Do not stop taking the antibiotic even if you start to feel better.  Avoid the following drinks: ? Alcohol. ? Caffeine. ? Tea. ? Carbonated drinks.  Drink enough fluid to keep your pee clear or pale yellow.  Keep all follow-up visits as told by your doctor. This is important.  Make sure to: ? Empty your bladder often and completely. Do not to hold pee for long periods of time. ? Empty your bladder before and after sex. ? Wipe from front to back after a bowel movement if you are female. Use each tissue one time when you wipe. Contact a doctor if:  You have back pain.  You have a fever.  You feel sick to your stomach (nauseous).  You throw up (vomit).  Your symptoms do not get better after 3 days.  Your symptoms go away and then come back. Get help right away if:  You have very bad back pain.  You have very bad lower belly (abdominal) pain.  You are throwing up and cannot keep down any medicines or water. This information is not intended to replace advice given to you by your health care provider. Make sure you discuss any questions you have with your health care provider. Document Released: 07/09/2007 Document Revised: 06/28/2015 Document Reviewed: 12/11/2014 Elsevier Interactive Patient Education  [employer].

## 2018-01-05 ENCOUNTER — Ambulatory Visit (INDEPENDENT_AMBULATORY_CARE_PROVIDER_SITE_OTHER): Payer: Medicare Other | Admitting: Pulmonary Disease

## 2018-01-05 ENCOUNTER — Encounter: Payer: Self-pay | Admitting: Pulmonary Disease

## 2018-01-05 VITALS — BP 138/76 | HR 93 | Ht 60.0 in | Wt 142.0 lb

## 2018-01-05 DIAGNOSIS — J301 Allergic rhinitis due to pollen: Secondary | ICD-10-CM | POA: Diagnosis not present

## 2018-01-05 DIAGNOSIS — J9611 Chronic respiratory failure with hypoxia: Secondary | ICD-10-CM | POA: Diagnosis not present

## 2018-01-05 DIAGNOSIS — J449 Chronic obstructive pulmonary disease, unspecified: Secondary | ICD-10-CM | POA: Diagnosis not present

## 2018-01-05 NOTE — Patient Instructions (Signed)
Follow up in 6 months 

## 2018-01-05 NOTE — Progress Notes (Signed)
[facility]  Chief Complaint  Patient presents with  . Follow-up    Pt has increase fatigue, had left hip replacement one month ago. Pt is having increase SOB with exertion, and wheezing in last month.     Constitutional:  BP 138/76 (BP Location: Left Arm, Cuff Size: Normal)   Pulse 93   Ht 5' (1.524 m)   Wt 142 lb (64.4 kg)   SpO2 96%   BMI 27.73 kg/m   Past Medical History:  Chronic cystitis, DDD, Anxiety, Depression, C diff 2017 and 2018, Diverticulosis, GERD, HTN, Hypothyroidism, PNA, Shingles, Spinal stenosis  Brief Summary:  Priscilla Cantrell is a 82 y.o. female  former smoker with GOLD 4 COPD, ACE cough, and nocturnal hypoxemia.  She had hip surgery about 1 month ago.  She is still walking with a cane.  She doesn't feel like her breathing is back to baseline.  She is okay at rest, but has more trouble with activity.  She hasn't been as active since she had hip surgery.  She isn't having cough, wheeze, sputum, or chest pain.  She was having trouble with dysuria.  Saw her PCP and prescribed cipro for possible UTI.  She hasn't started taking this yet.  Physical Exam:   Appearance - well kempt   ENMT - clear nasal mucosa, midline nasal  septum, no oral exudates, no LAN, trachea midline  Respiratory - normal chest wall, normal respiratory effort, no accessory muscle use, no wheeze/rales  CV - s1s2 regular rate and rhythm, no murmurs, no peripheral edema, radial pulses symmetric  GI - soft, non tender, no masses  Lymph - no adenopathy noted in neck and axillary areas  MSK - normal gait  Ext - no cyanosis, clubbing, or joint inflammation noted  Skin - no rashes, lesions, or ulcers  Neuro - normal strength, oriented x 3  Psych - normal mood and affect  Discussion:  I think her current symptoms are related to deconditioning after recent hip surgery.  I don't think she has progression of her COPD.  Assessment/Plan:   GOLD 4  COPD with chronic bronchitis. - continue spiriva, wixela, daliresp - prn albuterol  Allergic rhinitis with upper airway cough. - continue flonase, astelin  Chronic respiratory failure with hypoxia. - 2 liters oxygen at night  Dyspnea on exertion. - likely from deconditioning after recent hip surgery - discussed gradual exercise regimen  UTI. - advised her to start cipro as prescribed by her PCP  Patient Instructions  Follow up in 6 months    Chesley Mires, MD [place] Pager: [phone] 01/05/2018, 1:57 PM  Flow Sheet     Pulmonary tests:  Spirometry 01/10/09>>FEV1 0.97(52%), FEV1% 47 March 2012>>Daliresp started RAST 05/04/17 >> negative, IgE 8 Spirometry 05/14/17 >> FEV1 0.91 (55%), FEV1% 48  Sleep tests:  PSG 11/14/11 >> AHI 0.3 ONO with RA 07/31/16 >>test time 6 hrs 41 min. Average SpO2 87%, low SpO2 74%. Spent 4 hrs 13 min with SpO2 <88%.  Cardiac tests:  Echo 08/30/14 >> EF 60 to 50%, grade 1 diastolic dysfx, PAS 24 mmHG  Medications:   Allergies as of 01/05/2018      Reactions   Losartan Other (See Comments)   hyperkalemia   Penicillins Hives, Swelling, Rash   Has patient had a PCN reaction causing immediate rash, facial/tongue/throat swelling, SOB or lightheadedness with hypotension: YES Has patient had a PCN reaction causing severe rash involving mucus membranes or skin necrosis: NO  Has patient had a PCN reaction that required hospitalization NO Has patient had a PCN reaction occurring within the last 10 years: NO If all of the above answers are "NO", then may proceed with Cephalosporin use.   Cefixime [kdc:cefixime] Other (See Comments)   unspecified   Indocin [indomethacin] Other (See Comments)   Painful tongue and throat   Singulair [montelukast Sodium]    Chest tightness    Sulfa Antibiotics Other (See Comments)   unspecified   Ace Inhibitors Other (See Comments)   cough   Statins Other (See Comments)   Myalgias        Medication List        Accurate as of 01/05/18  1:57 PM. Always use your most recent med list.          albuterol 108 (90 Base) MCG/ACT inhaler Commonly known as:  PROVENTIL HFA;VENTOLIN HFA INHALE 2 PUFFS BY MOUTH EVERY 4 HOURS AS NEEDED   albuterol (2.5 MG/3ML) 0.083% nebulizer solution Commonly known as:  PROVENTIL INHALE 1 VIAL VIA NEBULIZER EVERY 6 HOURS AS NEEDED FOR WHEEZING OR SHORTNESS OF BREATH   amLODipine 5 MG tablet Commonly known as:  NORVASC TAKE 1 TABLET BY MOUTH EVERY DAY   aspirin 81 MG chewable tablet Chew 1 tablet (81 mg total) by mouth 2 (two) times daily. Take for 4 weeks, then resume regular dose.   azelastine 0.1 % nasal spray Commonly known as:  ASTELIN Place 2 sprays into both nostrils 2 (two) times daily. Use in each nostril as directed   ciprofloxacin 250 MG tablet Commonly known as:  CIPRO Take 1 tablet (250 mg total) by mouth 2 (two) times daily.   diazepam 5 MG tablet Commonly known as:  VALIUM TAKE 1 TABLET BY MOUTH EVERY MORNING AND 2 TABLETS EVERY NIGHT AT BEDTIME   diphenhydrAMINE 25 MG tablet Commonly known as:  BENADRYL Take 25 mg by mouth 3 (three) times daily as needed for allergies.   ferrous sulfate 325 (65 FE) MG tablet Take 1 tablet (325 mg total) by mouth 3 (three) times daily with meals.   fluticasone 50 MCG/ACT nasal spray Commonly known as:  FLONASE Place 1 spray into both nostrils daily.   HYDROcodone-acetaminophen 7.5-325 MG tablet Commonly known as:  NORCO Take 1-2 tablets by mouth every 4 (four) hours as needed for moderate pain.   hydrocortisone cream 1 % Apply 1 application topically daily as needed for itching.   LACTAID FAST ACT 9000 units Tabs Generic drug:  Lactase Take 9,000 Units by mouth daily as needed (dairy).   methocarbamol 500 MG tablet Commonly known as:  ROBAXIN Take 1 tablet (500 mg total) by mouth every 6 (six) hours as needed for muscle spasms.   OMEGA-3 FISH OIL PO Take 2,000 mg by  mouth daily.   polyethylene glycol packet Commonly known as:  MIRALAX / GLYCOLAX Take 17 g by mouth 2 (two) times daily.   PROBIOTIC DAILY PO Take 1 capsule by mouth daily.   Roflumilast 250 MCG Tabs Take 1 tablet by mouth daily.   SOOTHE XP Soln Place 1 drop into both eyes 2 (two) times daily as needed (irritation).   SPIRIVA HANDIHALER 18 MCG inhalation capsule Generic drug:  tiotropium INHALE 1 CAPSULE VIA HANDIHALER EVERY DAY AT THE SAME TIME   SYNTHROID 88 MCG tablet Generic drug:  levothyroxine TAKE 1 TABLET BY MOUTH DAILY   triamcinolone cream 0.1 % Commonly known as:  KENALOG Apply 1 application topically 2 (two) times daily.  venlafaxine 50 MG tablet Commonly known as:  EFFEXOR TAKE 1 TABLET(50 MG) BY MOUTH TWICE DAILY   WIXELA INHUB 250-50 MCG/DOSE Aepb Generic drug:  Fluticasone-Salmeterol INHALE 1 PUFF INTO THE LUNGS TWICE DAILY   XIIDRA 5 % Soln Generic drug:  Lifitegrast Place 1 drop into both eyes 2 (two) times daily.       Past Surgical History:  She  has a past surgical history that includes Cholecystectomy (2001); Tubal ligation (1974); Abdominal hysterectomy (1978); Breast cyst excision; Appendectomy; Tonsillectomy; Colonoscopy (04/2005; 12/06/2015); transthoracic echocardiogram (08/2014); Total hip arthroplasty (Right, 11/01/2014); Hip Closed Reduction (Right, 12/25/2015); DEXA (05/18/2017); and Total hip arthroplasty (Left, 12/08/2017).  Family History:  Her family history includes Cancer in her paternal aunt and paternal grandfather; Cirrhosis in her father; Diabetes in her daughter; Goiter in her mother; Hernia in her paternal grandmother; Hypertension in her paternal grandmother; Psoriasis in her father; Stroke in her maternal grandfather and paternal grandmother.  Social History:  She  reports that she quit smoking about 27 years ago. Her smoking use included cigarettes. She has a 70.00 pack-year smoking history. She has never used smokeless  tobacco. She reports that she drank alcohol. She reports that she does not use drugs.

## 2018-01-06 LAB — URINE CULTURE
MICRO NUMBER:: 91440164
SPECIMEN QUALITY:: ADEQUATE

## 2018-01-07 NOTE — Progress Notes (Signed)
+   UTI sensitive to abx used. No further action needed

## 2018-01-20 ENCOUNTER — Encounter: Payer: Self-pay | Admitting: Family Medicine

## 2018-01-20 ENCOUNTER — Ambulatory Visit (INDEPENDENT_AMBULATORY_CARE_PROVIDER_SITE_OTHER): Payer: Medicare Other | Admitting: Family Medicine

## 2018-01-20 VITALS — BP 134/76 | HR 82 | Temp 97.4°F | Resp 16 | Ht 60.0 in | Wt 141.4 lb

## 2018-01-20 DIAGNOSIS — M79662 Pain in left lower leg: Secondary | ICD-10-CM

## 2018-01-20 DIAGNOSIS — I83812 Varicose veins of left lower extremities with pain: Secondary | ICD-10-CM

## 2018-01-20 NOTE — Progress Notes (Signed)
OFFICE VISIT  01/20/2018   CC:  Chief Complaint  Patient presents with  . Leg Swelling    left leg   HPI:    Patient is a 82 y.o. Caucasian female who presents for left leg swelling. She had L THA on 12/08/17 and she says this went well.  Only hurts when walks sometimes. Uses a cane still b/c she is being careful on steps and uneven surfaces.  She fell on her deck 6 d/a.  It was night time and she had a long cape/coat on and when she stepped on first step her foot got caught in her cape.  She hit her L knee but did not hurt.  It was bruised a little.  Today her concern is an area of focal pain in lateral aspect of left lower leg. She has some varicose veins/spider veins in this region.  No recent trauma.  No redness of skin.  No edema. ROM of left knee and ankle intact w/out pain.  Feet pink, cap refill 1 sec.  DP and PT pulses 1+ and symmetric.  Past Medical History:  Diagnosis Date  . Allergic rhinitis    with upper airway cough: as of 07/2017 pulm f/u she was instructed to use astelin, flonase, and saline more consistently  . Chest pain, non-cardiac    Cardiac CT showed no signif obstructive dz, mildly elevated calcium level (Dr. [name]).  . Chronic cystitis with hematuria    [name]  . Chronic hypoxemic respiratory failure ([facility] [place]) 02/02/2015   2L oxygen 24/7  . COPD (chronic obstructive pulmonary disease) ([facility])    GOLD 4.  spirometry 01/10/09 FEV 0.97(52%), FEV1% 47.  With chronic bronchitis as of 07/2017.  . DDD (degenerative disc disease), cervical    1998 MRI C5-6 impingemt (left)  . DDD (degenerative disc disease), lumbar   . Depression with anxiety 04/15/2011  . Diarrheal disease Summer 2017   ? 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).  As of 10/05/15 pt reports GI dx'd her with C diff and rx'd more flagyl and she is also on cholestyramine.  As of 02/2016,  pt's sx's resolved completely.  . Diverticulosis of colon    Procto '97 and colonoscopy 2002  . Dysplastic nevus of upper extremity 04/2014   R tricep ([name])  . Dyspnea    HX OF COPD   . Edema of both lower extremities due to peripheral venous insufficiency    Varicosities bilat, swelling L>R.  R baker's cyst.  . Family history of adverse reaction to anesthesia    mother died when patient age 31 receiving anesthesia for thyroid surgery  . Fibrocystic breast disease    w/fibroadenoma.  Bx's showed NO atypia (Dr. Margot Chimes).  Mammo neg 2008.  Marland Kitchen GERD (gastroesophageal reflux disease)   . History of double vision    Ophthalmologist, Dr. Herbert Deaner, is further evaluating this with MRI  orbits and limited brain (myesthenia gravis testing neg 07/2010)  . History of home oxygen therapy    uses 2 liters at hs  . Hypertension    EKG 03/2010 normal  . Hypothyroidism    Hashimoto's, dx'd 1989  . Hypoxemia    O2 86% RA with exertion 01/10/09, 2 liters oxygen with exertion and sleep  . Lesion of right native kidney 2017   found on CT 02/2015.  F/u MRI 03/31/15 showed it to be smaller and less likely of any significance  but repeat CT renal protocol in 41mo was recommended by radiology.  This was done 10/26/15 and showed no interval change in the lesion, but b/c the lesion enhances with contrast, radiol rec'd urol consult (b/c pap renal RCC could not be excluded).  Saw urol 03/06/16--stable on u/s 09/2016 and 09/2017.  . Osteoarthritis, hip, bilateral    End stage; Hx of R THA.  L THA planned.  . Pneumonia   . Postmenopausal atrophic vaginitis    improved with estrace 2 X/week.  . Pulmonary nodule    CT chest 12/10, June 2011.  No nodule on CT 06/2010.  Marland Kitchen Recurrent Clostridium difficile diarrhea    Episode 09/19/2016: resolved with prolonged course of flagyl.  11/2016 recurrence treated with 10d of Dificid (Fidaxomicin) by GI (Dr. Earlean Shawl).  . Recurrent UTI    likely related to pt's atrophic vaginitis.  Urol  started vaginal estrace 03/2016.  Marland Kitchen Shingles 02/2014   R abd/flank  . 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 Stuffy and runny nose    REPORTS TODAY 10-08-17 , SAYS THAT SHE'S MADE APPT WITH HER PCP AND WILL CONTACT HER SURGOEN IF PLACED ON ABX    Past Surgical History:  Procedure Laterality Date  . ABDOMINAL HYSTERECTOMY  1978   no BSO per pt--nonmalignant reasons  . APPENDECTOMY    . BREAST CYST EXCISION     Last screening mammogram 10/2010 was normal.  . CHOLECYSTECTOMY  2001  . COLONOSCOPY  04/2005; 12/06/2015   2007 NORMAL.  2017 adenomatous polyp x 1.  Mod sigmoid diverticulosis (Dr. Earlean Shawl).    Marland Kitchen DEXA  05/18/2017   NORMAL (T-score 0.1)  . HIP CLOSED REDUCTION Right 12/25/2015   Procedure: CLOSED REDUCTION HIP;  Surgeon: Nicholes Stairs, MD;  Location: [facility];  Service: Orthopedics;  Laterality: Right;  . TONSILLECTOMY    . TOTAL HIP ARTHROPLASTY Right 11/01/2014   Procedure: RIGHT TOTAL HIP ARTHROPLASTY;  Surgeon: Latanya Maudlin, MD;  Location: [facility];  Service: Orthopedics;  Laterality: Right;  . TOTAL HIP ARTHROPLASTY Left 12/08/2017   Procedure: LEFT TOTAL HIP ARTHROPLASTY ANTERIOR APPROACH;  Surgeon: Paralee Cancel, MD;  Location: [facility];  Service: Orthopedics;  Laterality: Left;  70 mins  . TRANSTHORACIC ECHOCARDIOGRAM  08/2014   EF 60-65%, grade I diast dysfxn  . TUBAL LIGATION  1974    Outpatient Medications Prior to Visit  Medication Sig Dispense Refill  . albuterol (PROVENTIL HFA;VENTOLIN HFA) 108 (90 Base) MCG/ACT inhaler INHALE 2 PUFFS BY MOUTH EVERY 4 HOURS AS NEEDED (Patient taking differently: Inhale 2 puffs into the lungs every 4 (four) hours as needed for wheezing or shortness of breath. ) 8.5 g 1  . albuterol (PROVENTIL) (2.5 MG/3ML) 0.083% nebulizer solution INHALE 1 VIAL VIA NEBULIZER EVERY 6 HOURS AS NEEDED FOR WHEEZING OR SHORTNESS OF BREATH 75 mL 1  . amLODipine (NORVASC) 5 MG tablet TAKE 1 TABLET BY  MOUTH EVERY DAY 90 tablet 1  . Artificial Tear Solution (SOOTHE XP) SOLN Place 1 drop into both eyes 2 (two) times daily as needed (irritation).    Marland Kitchen azelastine (ASTELIN) 0.1 % nasal spray Place 2 sprays into both nostrils 2 (two) times daily. Use in each nostril as directed (Patient taking differently: Place 2 sprays into both nostrils 2 (two) times daily as needed for rhinitis or allergies. Use in each nostril as directed) 30 mL 3  . diazepam (VALIUM) 5 MG tablet TAKE 1 TABLET  BY MOUTH EVERY MORNING AND 2 TABLETS EVERY NIGHT AT BEDTIME (Patient taking differently: Take 5 mg by mouth at bedtime. May take an additional 5 mg dose as needed for anxiety) 90 tablet 5  . diphenhydrAMINE (BENADRYL) 25 MG tablet Take 25 mg by mouth 3 (three) times daily as needed for allergies.    . ferrous sulfate (FERROUSUL) 325 (65 FE) MG tablet Take 1 tablet (325 mg total) by mouth 3 (three) times daily with meals.  3  . fluticasone (FLONASE) 50 MCG/ACT nasal spray Place 1 spray into both nostrils daily. 16 g 2  . hydrocortisone cream 1 % Apply 1 application topically daily as needed for itching.    . Lactase (LACTAID FAST ACT) 9000 units TABS Take 9,000 Units by mouth daily as needed (dairy).    Marland Kitchen Lifitegrast (XIIDRA) 5 % SOLN Place 1 drop into both eyes 2 (two) times daily.    . Omega-3 Fatty Acids (OMEGA-3 FISH OIL PO) Take 2,000 mg by mouth daily.    . polyethylene glycol (MIRALAX / GLYCOLAX) packet Take 17 g by mouth 2 (two) times daily. 14 each 0  . Probiotic Product (PROBIOTIC DAILY PO) Take 1 capsule by mouth daily.     . Roflumilast (DALIRESP) 250 MCG TABS Take 1 tablet by mouth daily. (Patient taking differently: Take 2 tablets by mouth daily. ) 28 tablet 0  . SPIRIVA HANDIHALER 18 MCG inhalation capsule INHALE 1 CAPSULE VIA HANDIHALER EVERY DAY AT THE SAME TIME 30 capsule 5  . SYNTHROID 88 MCG tablet TAKE 1 TABLET BY MOUTH DAILY 30 tablet 5  . triamcinolone cream (KENALOG) 0.1 % Apply 1 application topically  2 (two) times daily. (Patient taking differently: Apply 1 application topically 2 (two) times daily as needed (irritation). ) 30 g 0  . venlafaxine (EFFEXOR) 50 MG tablet TAKE 1 TABLET(50 MG) BY MOUTH TWICE DAILY 180 tablet 3  . WIXELA INHUB 250-50 MCG/DOSE AEPB INHALE 1 PUFF INTO THE LUNGS TWICE DAILY (Patient taking differently: Inhale 1 puff into the lungs 2 (two) times daily. ) 1 each 5  . ciprofloxacin (CIPRO) 250 MG tablet Take 1 tablet (250 mg total) by mouth 2 (two) times daily. (Patient not taking: Reported on 01/20/2018) 6 tablet 0  . HYDROcodone-acetaminophen (NORCO) 7.5-325 MG tablet Take 1-2 tablets by mouth every 4 (four) hours as needed for moderate pain. (Patient not taking: Reported on 01/20/2018) 60 tablet 0  . methocarbamol (ROBAXIN) 500 MG tablet Take 1 tablet (500 mg total) by mouth every 6 (six) hours as needed for muscle spasms. (Patient not taking: Reported on 01/20/2018) 40 tablet 0   No facility-administered medications prior to visit.     Allergies  Allergen Reactions  . Losartan Other (See Comments)    hyperkalemia  . Penicillins Hives, Swelling and Rash    Has patient had a PCN reaction causing immediate rash, facial/tongue/throat swelling, SOB or lightheadedness with hypotension: YES Has patient had a PCN reaction causing severe rash involving mucus membranes or skin necrosis: NO Has patient had a PCN reaction that required hospitalization NO Has patient had a PCN reaction occurring within the last 10 years: NO If all of the above answers are "NO", then may proceed with Cephalosporin use.   . Cefixime [Kdc:Cefixime] Other (See Comments)    unspecified  . Indocin [Indomethacin] Other (See Comments)    Painful tongue and throat  . Singulair [Montelukast Sodium]     Chest tightness   . Sulfa Antibiotics Other (See Comments)  unspecified  . Ace Inhibitors Other (See Comments)    cough  . Statins Other (See Comments)    Myalgias     ROS As per  HPI  PE: Blood pressure 134/76, pulse 82, temperature (!) 97.4 F (36.3 C), temperature source Oral, resp. rate 16, height 5' (1.524 m), weight 141 lb 6 oz (64.1 kg), SpO2 97 %. Gen: Alert, well appearing.  Patient is oriented to person, place, time, and situation. AFFECT: pleasant, lucid thought and speech. R leg w/out pitting edema, erythema, skin breakdown, or tenderness. ROM of left knee and ankle intact w/out pain.  Feet pink, cap refill 1 sec.  DP and PT pulses 1+ and symmetric. This are 4-6 small segments of palpable varicosities in the area of concern on lateral aspect of L lower leg.  No erythema or warmth.  Mildly TTP.  No nodule/fluctuance.  Trace pitting edema. R leg entirely normal except a few scattered varicosities w/out sign of inflammation.   LABS:    Chemistry      Component Value Date/Time   NA 140 12/10/2017 0559   K 3.8 12/10/2017 0559   CL 109 12/10/2017 0559   CO2 25 12/10/2017 0559   BUN 12 12/10/2017 0559   CREATININE 0.60 12/10/2017 0559   CREATININE 0.84 02/27/2016 1533      Component Value Date/Time   CALCIUM 8.8 (L) 12/10/2017 0559   ALKPHOS 81 09/02/2017 1057   AST 16 09/02/2017 1057   ALT 12 09/02/2017 1057   BILITOT 0.8 09/02/2017 1057     Lab Results  Component Value Date   WBC 13.3 (H) 12/10/2017   HGB 9.9 (L) 12/10/2017   HCT 32.0 (L) 12/10/2017   MCV 91.7 12/10/2017   PLT 205 12/10/2017   Lab Results  Component Value Date   TSH 0.79 09/02/2017    IMPRESSION AND PLAN:  1) Left lower leg symptomatic varicose veins: refer to vein clinic in [place] for further eval/mgmt.  2) S/p L THA 12/2017-->doing very well.  An After Visit Summary was printed and given to the patient.  FOLLOW UP: Return in about 3 months (around 04/21/2018) for routine chronic illness f/u.  Signed:  Crissie Sickles, MD           01/20/2018

## 2018-01-26 ENCOUNTER — Encounter

## 2018-01-28 DIAGNOSIS — Z96642 Presence of left artificial hip joint: Secondary | ICD-10-CM | POA: Diagnosis not present

## 2018-01-28 DIAGNOSIS — Z471 Aftercare following joint replacement surgery: Secondary | ICD-10-CM | POA: Diagnosis not present

## 2018-01-29 ENCOUNTER — Other Ambulatory Visit: Payer: Self-pay | Admitting: Family Medicine

## 2018-02-01 ENCOUNTER — Ambulatory Visit (HOSPITAL_BASED_OUTPATIENT_CLINIC_OR_DEPARTMENT_OTHER)
Admission: RE | Admit: 2018-02-01 | Discharge: 2018-02-01 | Disposition: A | Discharge status: Home / Self Care | Payer: Medicare Other | Source: Ambulatory Visit | Attending: Family Medicine | Admitting: Family Medicine

## 2018-02-01 ENCOUNTER — Ambulatory Visit (INDEPENDENT_AMBULATORY_CARE_PROVIDER_SITE_OTHER): Payer: Medicare Other | Admitting: Family Medicine

## 2018-02-01 ENCOUNTER — Ambulatory Visit: Payer: Self-pay

## 2018-02-01 ENCOUNTER — Encounter: Payer: Self-pay | Admitting: Family Medicine

## 2018-02-01 VITALS — BP 132/76 | HR 80 | Temp 97.4°F | Resp 16 | Ht 60.0 in | Wt 142.0 lb

## 2018-02-01 DIAGNOSIS — M25552 Pain in left hip: Secondary | ICD-10-CM

## 2018-02-01 DIAGNOSIS — R0781 Pleurodynia: Secondary | ICD-10-CM

## 2018-02-01 DIAGNOSIS — W19XXXA Unspecified fall, initial encounter: Secondary | ICD-10-CM | POA: Insufficient documentation

## 2018-02-01 DIAGNOSIS — S299XXA Unspecified injury of thorax, initial encounter: Secondary | ICD-10-CM | POA: Diagnosis not present

## 2018-02-01 DIAGNOSIS — S79912A Unspecified injury of left hip, initial encounter: Secondary | ICD-10-CM | POA: Diagnosis not present

## 2018-02-01 DIAGNOSIS — Z96642 Presence of left artificial hip joint: Secondary | ICD-10-CM | POA: Insufficient documentation

## 2018-02-01 DIAGNOSIS — I700 Atherosclerosis of aorta: Secondary | ICD-10-CM | POA: Insufficient documentation

## 2018-02-01 DIAGNOSIS — R0789 Other chest pain: Secondary | ICD-10-CM

## 2018-02-01 DIAGNOSIS — J42 Unspecified chronic bronchitis: Secondary | ICD-10-CM | POA: Diagnosis not present

## 2018-02-01 NOTE — Patient Instructions (Signed)
We will call you as soon as we get your xray results.  We are checking your chest, ribs and hip.  You can use tylenol or advil for pain.    In the meantime- rest.

## 2018-02-01 NOTE — Progress Notes (Signed)
Priscilla Cantrell , 07/11/1935, 82 y.o., female MRN: [id number] Patient Care Team    Relationship Specialty Notifications Start End  [name], MD PCP - General   01/16/10    Comment: Arnetha Gula, MD Consulting Physician Cardiology  06/06/10   Monna Fam, MD Consulting Physician Ophthalmology  01/29/11   Chesley Mires, MD Consulting Physician Pulmonary Disease  12/21/12   Latanya Maudlin, MD Consulting Physician Orthopedic Surgery  09/26/14   Cleon Gustin, MD Consulting Physician Urology  03/10/16   Richmond Campbell, MD Consulting Physician Gastroenterology  10/28/16   Paralee Cancel, MD Consulting Physician Orthopedic Surgery  09/04/17     Chief Complaint  Patient presents with  . [place] on Friday. Hit Lt knee and hit Rt knee. Also hit Rt hand. Then noticed she hit her Rt side of breast and is hurting underneath her shoulder, near her axilla, cant move her arm down. Also complaining in her lower back.       Subjective: Pt presents for an OV with complaints of a fall 3 days ago when her dogs leash became caught around her ankle. She tripped over small ledge of her carport and fell into grass on her knees and right chest. She felt she was doing alright until yesterday in church she noticed feeling mildly winded with singing and mild discomfort with deep breaths. She reports if she coughs or sneezes she gets a pain in her right chest and under her right axilla. She recently underwent left hip surgery on 12/08/2017. She reports she did not fall on her hip. Her hip did start hurting her with walking yesterday-but not with every stride. She states she has bruises on her chest and bilateral knees.  Pt has tried nothing to ease their symptoms.   Depression screen [facility] 2/9 09/04/2017 11/18/2016 10/13/2016 10/13/2016 09/18/2015  Decreased Interest 0 0 0 0 0  Down, Depressed, Hopeless 0 0 0 0 1  PHQ - 2 Score 0 0 0 0 1  Altered sleeping 0 0 0 - -  Tired, decreased energy 0  0 1 - -  Change in appetite 0 0 0 - -  Feeling bad or failure about yourself  0 0 0 - -  Trouble concentrating 0 0 0 - -  Moving slowly or fidgety/restless 0 0 0 - -  Suicidal thoughts 0 0 0 - -  PHQ-9 Score 0 0 1 - -  Difficult doing work/chores Not difficult at all - Somewhat difficult - -  Some recent data might be hidden    Allergies  Allergen Reactions  . Losartan Other (See Comments)    hyperkalemia  . Penicillins Hives, Swelling and Rash    Has patient had a PCN reaction causing immediate rash, facial/tongue/throat swelling, SOB or lightheadedness with hypotension: YES Has patient had a PCN reaction causing severe rash involving mucus membranes or skin necrosis: NO Has patient had a PCN reaction that required hospitalization NO Has patient had a PCN reaction occurring within the last 10 years: NO If all of the above answers are "NO", then may proceed with Cephalosporin use.   . Cefixime [Kdc:Cefixime] Other (See Comments)    unspecified  . Indocin [Indomethacin] Other (See Comments)    Painful tongue and throat  . Singulair [Montelukast Sodium]     Chest tightness   . Sulfa Antibiotics Other (See Comments)    unspecified  . Ace Inhibitors Other (See Comments)  cough  . Statins Other (See Comments)    Myalgias    Social History   Tobacco Use  . Smoking status: Former Smoker    Packs/day: 2.00    Years: 35.00    Pack years: 70.00    Types: Cigarettes    Last attempt to quit: 02/03/1990    Years since quitting: 28.0  . Smokeless tobacco: Never Used  Substance Use Topics  . Alcohol use: Not Currently    Comment: Rarely   Past Medical History:  Diagnosis Date  . Allergic rhinitis    with upper airway cough: as of 07/2017 pulm f/u she was instructed to use astelin, flonase, and saline more consistently  . Chest pain, non-cardiac    Cardiac CT showed no signif obstructive dz, mildly elevated calcium level (Dr. [name]).  . Chronic cystitis with hematuria     [name]  . Chronic hypoxemic respiratory failure ([place]) 02/02/2015   2L oxygen 24/7  . COPD (chronic obstructive pulmonary disease) (H[facility])    GOLD 4.  spirometry 01/10/09 FEV 0.97(52%), FEV1% 47.  With chronic bronchitis as of 07/2017.  . DDD (degenerative disc disease), cervical    1998 MRI C5-6 impingemt (left)  . DDD (degenerative disc disease), lumbar   . Depression with anxiety 04/15/2011  . Diarrheal disease Summer 2017   ? 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).  As of 10/05/15 pt reports GI dx'd her with C diff and rx'd more flagyl and she is also on cholestyramine.  As of 02/2016, pt's sx's resolved completely.  . Diverticulosis of colon    Procto '97 and colonoscopy 2002  . Dysplastic nevus of upper extremity 04/2014   R tricep ([name])  . Dyspnea    HX OF COPD   . Edema of both lower extremities due to peripheral venous insufficiency    Varicosities bilat, swelling L>R.  R baker's cyst.  . Family history of adverse reaction to anesthesia    mother died when patient age 45 receiving anesthesia for thyroid surgery  . Fibrocystic breast disease    w/fibroadenoma.  Bx's showed NO atypia (Dr. Margot Chimes).  Mammo neg 2008.  Marland Kitchen GERD (gastroesophageal reflux disease)   . History of double vision    Ophthalmologist, Dr. Herbert Deaner, is further evaluating this with MRI  orbits and limited brain (myesthenia gravis testing neg 07/2010)  . History of home oxygen therapy    uses 2 liters at hs  . Hypertension    EKG 03/2010 normal  . Hypothyroidism    Hashimoto's, dx'd 1989  . Hypoxemia    O2 86% RA with exertion 01/10/09, 2 liters oxygen with exertion and sleep  . Lesion of right native kidney 2017   found on CT 02/2015.  F/u MRI 03/31/15 showed it to be smaller and less likely of any significance but repeat CT renal protocol in 49mo was recommended by radiology.  This was done 10/26/15  and showed no interval change in the lesion, but b/c the lesion enhances with contrast, radiol rec'd urol consult (b/c pap renal RCC could not be excluded).  Saw urol 03/06/16--stable on u/s 09/2016 and 09/2017.  . Osteoarthritis, hip, bilateral    End stage; Hx of R THA.  L THA planned.  . Pneumonia   . Postmenopausal atrophic vaginitis    improved with estrace 2 X/week.  . Pulmonary nodule    CT chest 12/10, June 2011.  No  nodule on CT 06/2010.  Marland Kitchen Recurrent Clostridium difficile diarrhea    Episode 09/19/2016: resolved with prolonged course of flagyl.  11/2016 recurrence treated with 10d of Dificid (Fidaxomicin) by GI (Dr. Earlean Shawl).  . Recurrent UTI    likely related to pt's atrophic vaginitis.  Urol started vaginal estrace 03/2016.  Marland Kitchen Shingles 02/2014   R abd/flank  . 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 Stuffy and runny nose    REPORTS TODAY 10-08-17 , SAYS THAT SHE'S MADE APPT WITH HER PCP AND WILL CONTACT HER SURGOEN IF PLACED ON ABX   Past Surgical History:  Procedure Laterality Date  . ABDOMINAL HYSTERECTOMY  1978   no BSO per pt--nonmalignant reasons  . APPENDECTOMY    . BREAST CYST EXCISION     Last screening mammogram 10/2010 was normal.  . CHOLECYSTECTOMY  2001  . COLONOSCOPY  04/2005; 12/06/2015   2007 NORMAL.  2017 adenomatous polyp x 1.  Mod sigmoid diverticulosis (Dr. Earlean Shawl).    Marland Kitchen DEXA  05/18/2017   NORMAL (T-score 0.1)  . HIP CLOSED REDUCTION Right 12/25/2015   Procedure: CLOSED REDUCTION HIP;  Surgeon: Nicholes Stairs, MD;  Location: [facility];  Service: Orthopedics;  Laterality: Right;  . TONSILLECTOMY    . TOTAL HIP ARTHROPLASTY Right 11/01/2014   Procedure: RIGHT TOTAL HIP ARTHROPLASTY;  Surgeon: Latanya Maudlin, MD;  Location: [facility];  Service: Orthopedics;  Laterality: Right;  . TOTAL HIP ARTHROPLASTY Left 12/08/2017   Procedure: LEFT TOTAL HIP ARTHROPLASTY ANTERIOR APPROACH;  Surgeon: Paralee Cancel, MD;   Location: [facility];  Service: Orthopedics;  Laterality: Left;  70 mins  . TRANSTHORACIC ECHOCARDIOGRAM  08/2014   EF 60-65%, grade I diast dysfxn  . TUBAL LIGATION  1974   Family History  Problem Relation Age of Onset  . Goiter Mother   . Psoriasis Father        od liver  . Cirrhosis Father   . Diabetes Daughter        Type 2  . Cancer Paternal Aunt        stomach?  . Stroke Maternal Grandfather   . Stroke Paternal Grandmother   . Hypertension Paternal Grandmother   . Hernia Paternal Grandmother   . Cancer Paternal Grandfather        lung   Allergies as of 02/01/2018      Reactions   Losartan Other (See Comments)   hyperkalemia   Penicillins Hives, Swelling, Rash   Has patient had a PCN reaction causing immediate rash, facial/tongue/throat swelling, SOB or lightheadedness with hypotension: YES Has patient had a PCN reaction causing severe rash involving mucus membranes or skin necrosis: NO Has patient had a PCN reaction that required hospitalization NO Has patient had a PCN reaction occurring within the last 10 years: NO If all of the above answers are "NO", then may proceed with Cephalosporin use.   Cefixime [kdc:cefixime] Other (See Comments)   unspecified   Indocin [indomethacin] Other (See Comments)   Painful tongue and throat   Singulair [montelukast Sodium]    Chest tightness    Sulfa Antibiotics Other (See Comments)   unspecified   Ace Inhibitors Other (See Comments)   cough   Statins Other (See Comments)   Myalgias      Medication List       Accurate as of February 01, 2018  3:26 PM. Always use your most recent med list.  albuterol 108 (90 Base) MCG/ACT inhaler Commonly known as:  PROVENTIL HFA;VENTOLIN HFA INHALE 2 PUFFS BY MOUTH EVERY 4 HOURS AS NEEDED   albuterol (2.5 MG/3ML) 0.083% nebulizer solution Commonly known as:  PROVENTIL INHALE 1 VIAL VIA NEBULIZER EVERY 6 HOURS AS NEEDED FOR WHEEZING OR SHORTNESS OF BREATH   amLODipine 5 MG  tablet Commonly known as:  NORVASC TAKE 1 TABLET BY MOUTH EVERY DAY   azelastine 0.1 % nasal spray Commonly known as:  ASTELIN Place 2 sprays into both nostrils 2 (two) times daily. Use in each nostril as directed   diazepam 5 MG tablet Commonly known as:  VALIUM Take 1 tablet (5 mg total) by mouth at bedtime. May take an additional 5 mg dose as needed for anxiety   diphenhydrAMINE 25 MG tablet Commonly known as:  BENADRYL Take 25 mg by mouth 3 (three) times daily as needed for allergies.   ferrous sulfate 325 (65 FE) MG tablet Commonly known as:  FERROUSUL Take 1 tablet (325 mg total) by mouth 3 (three) times daily with meals.   fluticasone 50 MCG/ACT nasal spray Commonly known as:  FLONASE Place 1 spray into both nostrils daily.   hydrocortisone cream 1 % Apply 1 application topically daily as needed for itching.   LACTAID FAST ACT 9000 units Tabs Generic drug:  Lactase Take 9,000 Units by mouth daily as needed (dairy).   OMEGA-3 FISH OIL PO Take 2,000 mg by mouth daily.   polyethylene glycol packet Commonly known as:  MIRALAX / GLYCOLAX Take 17 g by mouth 2 (two) times daily.   PROBIOTIC DAILY PO Take 1 capsule by mouth daily.   Roflumilast 250 MCG Tabs Commonly known as:  DALIRESP Take 1 tablet by mouth daily.   SOOTHE XP Soln Place 1 drop into both eyes 2 (two) times daily as needed (irritation).   SPIRIVA HANDIHALER 18 MCG inhalation capsule Generic drug:  tiotropium INHALE 1 CAPSULE VIA HANDIHALER EVERY DAY AT THE SAME TIME   SYNTHROID 88 MCG tablet Generic drug:  levothyroxine TAKE 1 TABLET BY MOUTH DAILY   triamcinolone cream 0.1 % Commonly known as:  KENALOG Apply 1 application topically 2 (two) times daily.   venlafaxine 50 MG tablet Commonly known as:  EFFEXOR TAKE 1 TABLET(50 MG) BY MOUTH TWICE DAILY   WIXELA INHUB 250-50 MCG/DOSE Aepb Generic drug:  Fluticasone-Salmeterol INHALE 1 PUFF INTO THE LUNGS TWICE DAILY   XIIDRA 5 %  Soln Generic drug:  Lifitegrast Place 1 drop into both eyes 2 (two) times daily.       All past medical history, surgical history, allergies, family history, immunizations andmedications were updated in the EMR today and reviewed under the history and medication portions of their EMR.     ROS: Negative, with the exception of above mentioned in HPI  Objective:  BP 132/76 (BP Location: Left Arm, Patient Position: Sitting, Cuff Size: Normal)   Pulse 80   Temp (!) 97.4 F (36.3 C) (Oral)   Resp 16   Ht 5' (1.524 m)   Wt 142 lb (64.4 kg)   SpO2 95%   BMI 27.73 kg/m  Body mass index is 27.73 kg/m. Gen: Afebrile. No acute distress. Nontoxic in appearance, well developed, well nourished.  HENT: AT. [place]. MMM Eyes:Pupils Equal Round Reactive to light, Extraocular movements intact,  Conjunctiva without redness, discharge or icterus. CV: RRR  Chest: CTAB, no wheeze or crackles. Good air movement all lung fields, normal resp effort. Skin/EXT: bruising bilateral knees and right  chest wall. TTP right chest wall, right axilla over rib shafts. No TTP lateral hip, discomfort with left hip flexion and weight bearing stride. MS 5/5 RLE, 5/5 LLE with exception of  4+/5 hip flexion. no rashes, purpura or petechiae.  Neuro: mild limp. PERLA. EOMi. Alert. Oriented x3 Psych: Normal affect, dress and demeanor. Normal speech. Normal thought content and judgment.  No exam data present No results found. No results found for this or any previous visit (from the past 24 hour(s)).  Assessment/Plan: RAYYA YAGI is a 82 y.o. female present for OV for  Fall, initial encounter/ Rib pain/Chest wall pain/Left hip pain - lung exam is normal; however she is rather tender over right chest wall and axilla- pain with cough, deep breath and sneezing--> concern for rib fracture or less likely small pneumothorax. Pain with ambulation of left hip, which started yesterday (3 days after fall)- however her hip surgery was  only 6 weeks ago.  - xray of ribs/chest and  hip ordered today.  - tylenol or nsaids for discomfort.  - advised her to make her orthopedic aware- but wait until after xray results.  - DG HIP UNILAT WITH PELVIS 2-3 VIEWS LEFT; Future - DG Ribs Unilateral Right; Future - DG Chest 2 View; Future - f/u dependent on xray results.     Reviewed expectations re: course of current medical issues.  Discussed self-management of symptoms.  Outlined signs and symptoms indicating need for more acute intervention.  Patient verbalized understanding and all questions were answered.  Patient received an After-Visit Summary.    No orders of the defined types were placed in this encounter.    Note is dictated utilizing voice recognition software. Although note has been proof read prior to signing, occasional typographical errors still can be missed. If any questions arise, please do not hesitate to call for verification.   electronically signed by:  Howard Pouch, DO  [place]

## 2018-02-01 NOTE — Telephone Encounter (Signed)
Incoming coming call from patient, who states that she fell on Friday and landed on her breast " boobs" and knees.  Can move arms. Cant bend over, hard to take deep breath.  Sings on choir was hard to sing in choir yesterday.  Painful to cough, and sneeze.   Chest pain rated  From 8 to 10.  Denies fever or rash.  Patient scheduled for an appointment 01/3018 @3 :30 with arrival at 3:15pm Patient voiced understanding.  Patient states that she is  using, Aspercreme.  Reports that It does help.   Reviewed care advice with Patient.  Voiced understanding.  Patient state that she will be at appointment at 3:15pm.     Reason for Disposition . [1] MODERATE pain (e.g., interferes with normal activities) AND [2] pain is WORSE with breathing  Answer Assessment - Initial Assessment Questions 1. MECHANISM: "How did the injury happen?"     *No Answer* 2. ONSET: "When did the injury happen?" (e.g., minutes, hours, days ago)     Friday 3. LOCATION: "Where on the chest is the injury located?" "Where does it hurt?"      Under arm top of the breast part underarm can move arm if Patient bends over rib cage? 4. CHEST OR RIB PAIN SEVERITY: "How bad is the pain?"  (e.g., Scale 1-10; mild, moderate, or severe) 8 to 9    - MILD (1-3): doesn't interfere with normal activities     - MODERATE (4-7): interferes with normal activities or awakens from sleep    - SEVERE (8-10): excruciating pain, unable to do any normal activities       8 to 9 5. BREATHING DIFFICULTY: "Are you having any difficulty breathing?" If so, ask "How bad is it?"  (e.g., none, mild, moderate, severe)  6: OTHER SYMPTOMS (e.g., cough, fever, rash)     denies 7. PREGNANCY: "Is there any chance you are pregnant?" "When was your last menstrual period?"     na  Protocols used: CHEST INJURY - BENDING, LIFTING, OR TWISTING-A-AH

## 2018-02-01 NOTE — Telephone Encounter (Signed)
Pt has apt with Dr. Raoul Pitch today (02/01/18) at 3:30pm.

## 2018-02-01 NOTE — Telephone Encounter (Signed)
RF request for Valium Last OV 01/20/18 Next OV 04/28/18  Last RX 12/29/16 # 90 x 5 RFS  Please advise.

## 2018-02-04 ENCOUNTER — Other Ambulatory Visit: Payer: Self-pay | Admitting: Family Medicine

## 2018-02-05 DIAGNOSIS — M79604 Pain in right leg: Secondary | ICD-10-CM | POA: Diagnosis not present

## 2018-02-05 DIAGNOSIS — I8311 Varicose veins of right lower extremity with inflammation: Secondary | ICD-10-CM | POA: Diagnosis not present

## 2018-02-05 DIAGNOSIS — I8312 Varicose veins of left lower extremity with inflammation: Secondary | ICD-10-CM | POA: Diagnosis not present

## 2018-02-05 DIAGNOSIS — M79605 Pain in left leg: Secondary | ICD-10-CM | POA: Diagnosis not present

## 2018-02-08 ENCOUNTER — Ambulatory Visit (INDEPENDENT_AMBULATORY_CARE_PROVIDER_SITE_OTHER): Payer: Medicare Other | Admitting: Family Medicine

## 2018-02-08 ENCOUNTER — Ambulatory Visit: Payer: Self-pay | Admitting: *Deleted

## 2018-02-08 ENCOUNTER — Encounter: Payer: Self-pay | Admitting: Family Medicine

## 2018-02-08 VITALS — BP 144/72 | HR 74 | Temp 98.5°F | Resp 16 | Ht 60.0 in | Wt 139.2 lb

## 2018-02-08 DIAGNOSIS — R0789 Other chest pain: Secondary | ICD-10-CM

## 2018-02-08 DIAGNOSIS — S20211D Contusion of right front wall of thorax, subsequent encounter: Secondary | ICD-10-CM | POA: Diagnosis not present

## 2018-02-08 DIAGNOSIS — S29011A Strain of muscle and tendon of front wall of thorax, initial encounter: Secondary | ICD-10-CM

## 2018-02-08 NOTE — Telephone Encounter (Signed)
Noted  

## 2018-02-08 NOTE — Telephone Encounter (Signed)
Pt called with having some pain that goes across the right breast and under the right arm (pit). She was seen in the office and had xrays done. No fractures found. This happened on Dec 30 th and she still feels the pain when she sneezes or coughs. It does not bother her when she uses her arm.  She is using Aspercreme roll on, ice and heat. Appointment scheduled for a recheck of her pain. Routing to flow at [facility] at [facility].  Reason for Disposition . Nursing judgment or information in reference  Answer Assessment - Initial Assessment Questions 1. REASON FOR CALL: "What is your main concern right now?"     Still having pain under the right arm after falling on Dec 30 th. 2. ONSET: "When did the pain start?"     After the fall 3. SEVERITY: "How bad is the pain?"      Only when she sneezes or cough 4. FEVER: "Do you have a fever?"     no 5. OTHER SYMPTOMS: "Do you have any other new symptoms?"     no 6. INTERVENTIONS AND RESPONSE: "What have you done so far to try to make this better? What medications have you used?"     Used Aspercreme roll on, heat and ice 7. PREGNANCY: "Is there any chance you are pregnant?"     n/a  Protocols used: NO GUIDELINE AVAILABLE-A-AH

## 2018-02-08 NOTE — Progress Notes (Signed)
OFFICE VISIT  02/08/2018   CC:  Chief Complaint  Patient presents with  . Chest Pain    right side from fall end of December   HPI:    Patient is a 83 y.o. Caucasian female who presents for right sided chest wall pain. She had a fall 1 week ago: tangled feet up with dog leashes, stepped off carport step about 6 inches high and she hit L knee and R axillary and R breast region on ground.    Was seen here 12/30/19and had a normal left hip x-ray as well as right side ribs x-ray which showed no fractures. Pt states that since that visit she has become much more sore in right axillary region, but only when she takes a very deep breath, coughs, or sneezes.  No feeling of SOB that is worse than her baseline.   The area in question does not hurt sitting still with arms and body/torso at rest.    Pertinent imaging: DG ribs unilateral right on 02/01/18: CLINICAL DATA:  Status post fall at home 3 days ago. The patient is complaining of right upper lateral rib discomfort with a pleuritic component.  EXAM: RIGHT RIBS - 2 VIEW  COMPARISON:  Chest x-ray of February 01, 2018 and March 12, 2017.  FINDINGS: Two views of the right ribs reveal the bones to be subjectively adequately mineralized. No acute displaced fracture is observed. There is a 4 mm diameter soft tissue density nodule that projects over the anterior aspect of the right 6th rib which is stable. There is no pleural effusion or pneumothorax.  IMPRESSION: No acute right rib fracture is observed.  Past Medical History:  Diagnosis Date  . Allergic rhinitis    with upper airway cough: as of 07/2017 pulm f/u she was instructed to use astelin, flonase, and saline more consistently  . Chest pain, non-cardiac    Cardiac CT showed no signif obstructive dz, mildly elevated calcium level (Dr. [name]).  . Chronic cystitis with hematuria    [name]  . Chronic hypoxemic respiratory failure ([place]) 02/02/2015   2L  oxygen 24/7  . COPD (chronic obstructive pulmonary disease) ([facility])    GOLD 4.  spirometry 01/10/09 FEV 0.97(52%), FEV1% 47.  With chronic bronchitis as of 07/2017.  . DDD (degenerative disc disease), cervical    1998 MRI C5-6 impingemt (left)  . DDD (degenerative disc disease), lumbar   . Depression with anxiety 04/15/2011  . Diarrheal disease Summer 2017   ? 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).  As of 10/05/15 pt reports GI dx'd her with C diff and rx'd more flagyl and she is also on cholestyramine.  As of 02/2016, pt's sx's resolved completely.  . Diverticulosis of colon    Procto '97 and colonoscopy 2002  . Dysplastic nevus of upper extremity 04/2014   R tricep ([name])  . Dyspnea    HX OF COPD   . Edema of both lower extremities due to peripheral venous insufficiency    Varicosities bilat, swelling L>R.  R baker's cyst.  . Family history of adverse reaction to anesthesia    mother died when patient age 51 receiving anesthesia for thyroid surgery  . Fibrocystic breast disease    w/fibroadenoma.  Bx's showed NO atypia (Dr. Margot Chimes).  Mammo neg 2008.  Marland Kitchen GERD (gastroesophageal reflux disease)   . History of double vision    Ophthalmologist, Dr. Herbert Deaner, is  further evaluating this with MRI  orbits and limited brain (myesthenia gravis testing neg 07/2010)  . History of home oxygen therapy    uses 2 liters at hs  . Hypertension    EKG 03/2010 normal  . Hypothyroidism    Hashimoto's, dx'd 1989  . Hypoxemia    O2 86% RA with exertion 01/10/09, 2 liters oxygen with exertion and sleep  . Lesion of right native kidney 2017   found on CT 02/2015.  F/u MRI 03/31/15 showed it to be smaller and less likely of any significance but repeat CT renal protocol in 60mo was recommended by radiology.  This was done 10/26/15 and showed no interval change in the lesion, but b/c the lesion enhances with contrast,  radiol rec'd urol consult (b/c pap renal RCC could not be excluded).  Saw urol 03/06/16--stable on u/s 09/2016 and 09/2017.  . Osteoarthritis, hip, bilateral    End stage; Hx of R THA.  L THA planned.  . Pneumonia   . Postmenopausal atrophic vaginitis    improved with estrace 2 X/week.  . Pulmonary nodule    CT chest 12/10, June 2011.  No nodule on CT 06/2010.  Marland Kitchen Recurrent Clostridium difficile diarrhea    Episode 09/19/2016: resolved with prolonged course of flagyl.  11/2016 recurrence treated with 10d of Dificid (Fidaxomicin) by GI (Dr. Earlean Shawl).  . Recurrent UTI    likely related to pt's atrophic vaginitis.  Urol started vaginal estrace 03/2016.  Marland Kitchen Shingles 02/2014   R abd/flank  . 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 Stuffy and runny nose    REPORTS TODAY 10-08-17 , SAYS THAT SHE'S MADE APPT WITH HER PCP AND WILL CONTACT HER SURGOEN IF PLACED ON ABX    Past Surgical History:  Procedure Laterality Date  . ABDOMINAL HYSTERECTOMY  1978   no BSO per pt--nonmalignant reasons  . APPENDECTOMY    . BREAST CYST EXCISION     Last screening mammogram 10/2010 was normal.  . CHOLECYSTECTOMY  2001  . COLONOSCOPY  04/2005; 12/06/2015   2007 NORMAL.  2017 adenomatous polyp x 1.  Mod sigmoid diverticulosis (Dr. Earlean Shawl).    Marland Kitchen DEXA  05/18/2017   NORMAL (T-score 0.1)  . HIP CLOSED REDUCTION Right 12/25/2015   Procedure: CLOSED REDUCTION HIP;  Surgeon: Nicholes Stairs, MD;  Location: [facility];  Service: Orthopedics;  Laterality: Right;  . TONSILLECTOMY    . TOTAL HIP ARTHROPLASTY Right 11/01/2014   Procedure: RIGHT TOTAL HIP ARTHROPLASTY;  Surgeon: Latanya Maudlin, MD;  Location: [facility];  Service: Orthopedics;  Laterality: Right;  . TOTAL HIP ARTHROPLASTY Left 12/08/2017   Procedure: LEFT TOTAL HIP ARTHROPLASTY ANTERIOR APPROACH;  Surgeon: Paralee Cancel, MD;  Location: [facility];  Service: Orthopedics;  Laterality: Left;  70 mins  . TRANSTHORACIC  ECHOCARDIOGRAM  08/2014   EF 60-65%, grade I diast dysfxn  . TUBAL LIGATION  1974    Outpatient Medications Prior to Visit  Medication Sig Dispense Refill  . albuterol (PROAIR HFA) 108 (90 Base) MCG/ACT inhaler Inhale 2 puffs into the lungs every 4 (four) hours as needed for wheezing or shortness of breath. 8.5 g 1  . albuterol (PROVENTIL) (2.5 MG/3ML) 0.083% nebulizer solution INHALE 1 VIAL VIA NEBULIZER EVERY 6 HOURS AS NEEDED FOR WHEEZING OR SHORTNESS OF BREATH 75 mL 1  . amLODipine (NORVASC) 5 MG tablet TAKE 1 TABLET BY MOUTH EVERY DAY 90 tablet 1  . Artificial Tear  Solution (SOOTHE XP) SOLN Place 1 drop into both eyes 2 (two) times daily as needed (irritation).    Marland Kitchen azelastine (ASTELIN) 0.1 % nasal spray Place 2 sprays into both nostrils 2 (two) times daily. Use in each nostril as directed (Patient taking differently: Place 2 sprays into both nostrils 2 (two) times daily as needed for rhinitis or allergies. Use in each nostril as directed) 30 mL 3  . diazepam (VALIUM) 5 MG tablet Take 1 tablet (5 mg total) by mouth at bedtime. May take an additional 5 mg dose as needed for anxiety 60 tablet 5  . diphenhydrAMINE (BENADRYL) 25 MG tablet Take 25 mg by mouth 3 (three) times daily as needed for allergies.    . ferrous sulfate (FERROUSUL) 325 (65 FE) MG tablet Take 1 tablet (325 mg total) by mouth 3 (three) times daily with meals.  3  . fluticasone (FLONASE) 50 MCG/ACT nasal spray Place 1 spray into both nostrils daily. 16 g 2  . hydrocortisone cream 1 % Apply 1 application topically daily as needed for itching.    . Lactase (LACTAID FAST ACT) 9000 units TABS Take 9,000 Units by mouth daily as needed (dairy).    Marland Kitchen Lifitegrast (XIIDRA) 5 % SOLN Place 1 drop into both eyes 2 (two) times daily.    . Omega-3 Fatty Acids (OMEGA-3 FISH OIL PO) Take 2,000 mg by mouth daily.    . polyethylene glycol (MIRALAX / GLYCOLAX) packet Take 17 g by mouth 2 (two) times daily. 14 each 0  . Probiotic Product  (PROBIOTIC DAILY PO) Take 1 capsule by mouth daily.     . Roflumilast (DALIRESP) 250 MCG TABS Take 1 tablet by mouth daily. (Patient taking differently: Take 2 tablets by mouth daily. ) 28 tablet 0  . SPIRIVA HANDIHALER 18 MCG inhalation capsule INHALE 1 CAPSULE VIA HANDIHALER EVERY DAY AT THE SAME TIME 30 capsule 5  . SYNTHROID 88 MCG tablet TAKE 1 TABLET BY MOUTH DAILY 30 tablet 5  . triamcinolone cream (KENALOG) 0.1 % Apply 1 application topically 2 (two) times daily. (Patient taking differently: Apply 1 application topically 2 (two) times daily as needed (irritation). ) 30 g 0  . venlafaxine (EFFEXOR) 50 MG tablet TAKE 1 TABLET(50 MG) BY MOUTH TWICE DAILY 180 tablet 3  . WIXELA INHUB 250-50 MCG/DOSE AEPB INHALE 1 PUFF INTO THE LUNGS TWICE DAILY (Patient taking differently: Inhale 1 puff into the lungs 2 (two) times daily. ) 1 each 5   No facility-administered medications prior to visit.     Allergies  Allergen Reactions  . Losartan Other (See Comments)    hyperkalemia  . Penicillins Hives, Swelling and Rash    Has patient had a PCN reaction causing immediate rash, facial/tongue/throat swelling, SOB or lightheadedness with hypotension: YES Has patient had a PCN reaction causing severe rash involving mucus membranes or skin necrosis: NO Has patient had a PCN reaction that required hospitalization NO Has patient had a PCN reaction occurring within the last 10 years: NO If all of the above answers are "NO", then may proceed with Cephalosporin use.   . Cefixime [Kdc:Cefixime] Other (See Comments)    unspecified  . Indocin [Indomethacin] Other (See Comments)    Painful tongue and throat  . Singulair [Montelukast Sodium]     Chest tightness   . Sulfa Antibiotics Other (See Comments)    unspecified  . Ace Inhibitors Other (See Comments)    cough  . Statins Other (See Comments)    Myalgias  ROS As per HPI  PE: Blood pressure (!) 144/72, pulse 74, temperature 98.5 F (36.9 C),  temperature source Oral, resp. rate 16, height 5' (1.524 m), weight 139 lb 4 oz (63.2 kg), SpO2 91 %. Gen: Alert, well appearing.  Patient is oriented to person, place, time, and situation. AFFECT: pleasant, lucid thought and speech. CV: RRR, no m/r/g.   LUNGS: CTA bilat, nonlabored resps, good aeration in all lung fields. She has some TTP along the R axillary chest wall at about the 7th-9th rib levels diffusely, w/out crepitus or deformity.   LABS:    Chemistry      Component Value Date/Time   NA 140 12/10/2017 0559   K 3.8 12/10/2017 0559   CL 109 12/10/2017 0559   CO2 25 12/10/2017 0559   BUN 12 12/10/2017 0559   CREATININE 0.60 12/10/2017 0559   CREATININE 0.84 02/27/2016 1533      Component Value Date/Time   CALCIUM 8.8 (L) 12/10/2017 0559   ALKPHOS 81 09/02/2017 1057   AST 16 09/02/2017 1057   ALT 12 09/02/2017 1057   BILITOT 0.8 09/02/2017 1057       IMPRESSION AND PLAN:  Right sided chest wall pain, most consistent with soft tissue contusion--intercostal strain. History and physical exam today points away from any fracture. Plan: heat, relative rest, stretch, tylenol q6h prn. Signs/symptoms to call or return for were reviewed and pt expressed understanding.  An After Visit Summary was printed and given to the patient.  FOLLOW UP: Return for as needed.  Signed:  Crissie Sickles, MD           02/08/2018

## 2018-02-11 DIAGNOSIS — I8312 Varicose veins of left lower extremity with inflammation: Secondary | ICD-10-CM | POA: Diagnosis not present

## 2018-02-11 DIAGNOSIS — M79605 Pain in left leg: Secondary | ICD-10-CM | POA: Diagnosis not present

## 2018-02-11 DIAGNOSIS — M79604 Pain in right leg: Secondary | ICD-10-CM | POA: Diagnosis not present

## 2018-02-11 DIAGNOSIS — I8311 Varicose veins of right lower extremity with inflammation: Secondary | ICD-10-CM | POA: Diagnosis not present

## 2018-02-18 DIAGNOSIS — I83813 Varicose veins of bilateral lower extremities with pain: Secondary | ICD-10-CM | POA: Diagnosis not present

## 2018-03-03 ENCOUNTER — Encounter: Payer: Self-pay | Admitting: Family Medicine

## 2018-03-06 DIAGNOSIS — H8110 Benign paroxysmal vertigo, unspecified ear: Secondary | ICD-10-CM

## 2018-03-06 HISTORY — DX: Benign paroxysmal vertigo, unspecified ear: H81.10

## 2018-03-16 ENCOUNTER — Encounter: Payer: Self-pay | Admitting: Family Medicine

## 2018-03-29 ENCOUNTER — Ambulatory Visit: Payer: Self-pay | Admitting: *Deleted

## 2018-03-29 ENCOUNTER — Ambulatory Visit (INDEPENDENT_AMBULATORY_CARE_PROVIDER_SITE_OTHER): Payer: Medicare Other | Admitting: Family Medicine

## 2018-03-29 ENCOUNTER — Encounter: Payer: Self-pay | Admitting: Family Medicine

## 2018-03-29 VITALS — BP 111/67 | HR 80 | Temp 97.4°F | Resp 16 | Ht 60.0 in | Wt 135.8 lb

## 2018-03-29 DIAGNOSIS — H8110 Benign paroxysmal vertigo, unspecified ear: Secondary | ICD-10-CM | POA: Diagnosis not present

## 2018-03-29 NOTE — Telephone Encounter (Signed)
Patient calls with dizziness with vertigo that she noticed this morning upon waking. Walking her dog this morning, she bent down and ended with sitting on her bottom due to the vertigo. She is using a cane and will move to using her walker to stable her today. She does have nausea with this. Denies CP/SOB/weakness/numbness/fever/HA/ Ear pain. Reports mild nasal congestion at times. Denies cough and fever. Reports she has had this in the past and had to perform head exercises to relieve the dizziness. Tried those today to no avail. Drinks approximately 3 glasses of liquids daily. Reviewed advice care regarding safety and urgent symptoms. Discussed it is not safe for her to drive at this time. No availability at practice today/tomorrow. Routing to PCP for appointment consideration.    Reason for Disposition . [1] MODERATE dizziness (e.g., vertigo; feels very unsteady, interferes with normal activities) AND [2] has NOT been evaluated by physician for this  Answer Assessment - Initial Assessment Questions 1. DESCRIPTION: "Describe your dizziness."     "room spinning" when she bent over this morning. Ended up on her bottom. 2. VERTIGO: "Do you feel like either you or the room is spinning or tilting?"      vertigo 3. LIGHTHEADED: "Do you feel lightheaded?" (e.g., somewhat faint, woozy, weak upon standing)    Feels the room is spinning 4. SEVERITY: "How bad is it?"  "Can you walk?"   - MILD - Feels unsteady but walking normally.   - MODERATE - Feels very unsteady when walking, but not falling; interferes with normal activities (e.g., school, work) .   - SEVERE - Unable to walk without falling (requires assistance).     Severe, using cane to assist her.  5. ONSET:  "When did the dizziness begin?"     This morning. 6. AGGRAVATING FACTORS: "Does anything make it worse?" (e.g., standing, change in head position)     Yes, changing head, standing up and bending over. 7. CAUSE: "What do you think is causing  the dizziness?"     Does not know. 8. RECURRENT SYMPTOM: "Have you had dizziness before?" If so, ask: "When was the last time?" "What happened that time?"     Several years ago. 9. OTHER SYMPTOMS: "Do you have any other symptoms?" (e.g., headache, weakness, numbness, vomiting, earache)     Just nausea  10. PREGNANCY: "Is there any chance you are pregnant?" "When was your last menstrual period?"       no  Protocols used: DIZZINESS - VERTIGO-A-AH

## 2018-03-29 NOTE — Progress Notes (Signed)
OFFICE VISIT  03/29/2018   CC:  Chief Complaint  Patient presents with  . Dizziness   HPI:    Patient is a 83 y.o. Caucasian female who presents for dizziness. Onset of positional vertigo this morning, very nauseated with the vertigo.  Head turning would provoke the vertigo again.  Didn't vomit.  Lasted approx 1 minute. She fell over in the yard when she went to feed her dog, fell gently onto R hip, no pain. No HA preceding.  No meds taken to help this. She checked her bp after the fall and it was 665L systolic. No facial or extremity weakness.  No paresthesias.  Feels like she is "drunk" when walking when in between vertigo spells.  No recent fevers, cough, resp sx's, or ear pains.   Has hx of very brief periods of BPPV in the past, hardly ever has to do Epley's.  She notes that her feet have felt more numb lately, severity seems to wax and wane.  No color change of hands or feet.  Past Medical History:  Diagnosis Date  . Allergic rhinitis    with upper airway cough: as of 07/2017 pulm f/u she was instructed to use astelin, flonase, and saline more consistently  . Chest pain, non-cardiac    Cardiac CT showed no signif obstructive dz, mildly elevated calcium level (Dr. [name]).  . Chronic cystitis with hematuria    [name]  . Chronic hypoxemic respiratory failure ([place]) 02/02/2015   2L oxygen 24/7  . COPD (chronic obstructive pulmonary disease) (HCC)    GOLD 4.  spirometry 01/10/09 FEV 0.97(52%), FEV1% 47.  With chronic bronchitis as of 07/2017.  . DDD (degenerative disc disease), cervical    1998 MRI C5-6 impingemt (left)  . DDD (degenerative disc disease), lumbar   . Depression with anxiety 04/15/2011  . Diarrheal disease Summer 2017   ? 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).  As of 10/05/15 pt reports GI dx'd her with C diff and rx'd more flagyl and she is  also on cholestyramine.  As of 02/2016, pt's sx's resolved completely.  . Diverticulosis of colon    Procto '97 and colonoscopy 2002  . Dysplastic nevus of upper extremity 04/2014   R tricep ([name])  . Edema of both lower extremities due to peripheral venous insufficiency    Varicosities bilat, swelling L>R.  R baker's cyst.  Got vein clinic eval 02/2018->sclerotherapy recommended but since no hemorrhage or ulceration, insurance will not cover the procedure.  . Family history of adverse reaction to anesthesia    mother died when patient age 56 receiving anesthesia for thyroid surgery  . Fibrocystic breast disease    w/fibroadenoma.  Bx's showed NO atypia (Dr. Margot Chimes).  Mammo neg 2008.  Marland Kitchen GERD (gastroesophageal reflux disease)   . History of double vision    Ophthalmologist, Dr. Herbert Deaner, is further evaluating this with MRI  orbits and limited brain (myesthenia gravis testing neg 07/2010)  . History of home oxygen therapy    uses 2 liters at hs  . Hypertension    EKG 03/2010 normal  . Hypothyroidism    Hashimoto's, dx'd 1989  . Lesion of right native kidney 2017   found on CT 02/2015.  F/u MRI 03/31/15 showed it to be smaller and less likely of any significance but repeat CT renal protocol in 62mo was recommended by radiology.  This was done 10/26/15 and showed  no interval change in the lesion, but b/c the lesion enhances with contrast, radiol rec'd urol consult (b/c pap renal RCC could not be excluded).  Saw urol 03/06/16--stable on u/s 09/2016 and 09/2017.  . Osteoarthritis, hip, bilateral    she is s/p bilat THA as of 02/2018.  Marland Kitchen Pneumonia   . Postmenopausal atrophic vaginitis    improved with estrace 2 X/week.  . Pulmonary nodule    CT chest 12/10, June 2011.  No nodule on CT 06/2010.  Marland Kitchen Recurrent Clostridium difficile diarrhea    Episode 09/19/2016: resolved with prolonged course of flagyl.  11/2016 recurrence treated with 10d of Dificid (Fidaxomicin) by GI (Dr. Earlean Shawl).  . Recurrent UTI     likely related to pt's atrophic vaginitis.  Urol started vaginal estrace 03/2016.  Marland Kitchen Shingles 02/2014   R abd/flank  . Spinal stenosis of lumbar region with neurogenic claudication    2008 MRI (L3-4, L4-5, L5-S1)    Past Surgical History:  Procedure Laterality Date  . ABDOMINAL HYSTERECTOMY  1978   no BSO per pt--nonmalignant reasons  . APPENDECTOMY    . BREAST CYST EXCISION     Last screening mammogram 10/2010 was normal.  . CHOLECYSTECTOMY  2001  . COLONOSCOPY  04/2005; 12/06/2015   2007 NORMAL.  2017 adenomatous polyp x 1.  Mod sigmoid diverticulosis (Dr. Earlean Shawl).    Marland Kitchen DEXA  05/18/2017   NORMAL (T-score 0.1)  . HIP CLOSED REDUCTION Right 12/25/2015   Procedure: CLOSED REDUCTION HIP;  Surgeon: Nicholes Stairs, MD;  Location: [facility];  Service: Orthopedics;  Laterality: Right;  . TONSILLECTOMY    . TOTAL HIP ARTHROPLASTY Right 11/01/2014   Procedure: RIGHT TOTAL HIP ARTHROPLASTY;  Surgeon: Latanya Maudlin, MD;  Location: [facility];  Service: Orthopedics;  Laterality: Right;  . TOTAL HIP ARTHROPLASTY Left 12/08/2017   Procedure: LEFT TOTAL HIP ARTHROPLASTY ANTERIOR APPROACH;  Surgeon: Paralee Cancel, MD;  Location: [facility];  Service: Orthopedics;  Laterality: Left;  70 mins  . TRANSTHORACIC ECHOCARDIOGRAM  08/2014   EF 60-65%, grade I diast dysfxn  . TUBAL LIGATION  1974    Outpatient Medications Prior to Visit  Medication Sig Dispense Refill  . albuterol (PROAIR HFA) 108 (90 Base) MCG/ACT inhaler Inhale 2 puffs into the lungs every 4 (four) hours as needed for wheezing or shortness of breath. 8.5 g 1  . albuterol (PROVENTIL) (2.5 MG/3ML) 0.083% nebulizer solution INHALE 1 VIAL VIA NEBULIZER EVERY 6 HOURS AS NEEDED FOR WHEEZING OR SHORTNESS OF BREATH 75 mL 1  . amLODipine (NORVASC) 5 MG tablet TAKE 1 TABLET BY MOUTH EVERY DAY 90 tablet 1  . Artificial Tear Solution (SOOTHE XP) SOLN Place 1 drop into both eyes 2 (two) times daily as needed (irritation).    Marland Kitchen azelastine (ASTELIN) 0.1 % nasal  spray Place 2 sprays into both nostrils 2 (two) times daily. Use in each nostril as directed (Patient taking differently: Place 2 sprays into both nostrils 2 (two) times daily as needed for rhinitis or allergies. Use in each nostril as directed) 30 mL 3  . diazepam (VALIUM) 5 MG tablet Take 1 tablet (5 mg total) by mouth at bedtime. May take an additional 5 mg dose as needed for anxiety 60 tablet 5  . hydrocortisone cream 1 % Apply 1 application topically daily as needed for itching.    Marland Kitchen Lifitegrast (XIIDRA) 5 % SOLN Place 1 drop into both eyes 2 (two) times daily.    . Probiotic Product (PROBIOTIC DAILY PO) Take  1 capsule by mouth daily.     Marland Kitchen SPIRIVA HANDIHALER 18 MCG inhalation capsule INHALE 1 CAPSULE VIA HANDIHALER EVERY DAY AT THE SAME TIME 30 capsule 5  . SYNTHROID 88 MCG tablet TAKE 1 TABLET BY MOUTH DAILY 30 tablet 5  . venlafaxine (EFFEXOR) 50 MG tablet TAKE 1 TABLET(50 MG) BY MOUTH TWICE DAILY 180 tablet 3  . WIXELA INHUB 250-50 MCG/DOSE AEPB INHALE 1 PUFF INTO THE LUNGS TWICE DAILY (Patient taking differently: Inhale 1 puff into the lungs 2 (two) times daily. ) 1 each 5  . diphenhydrAMINE (BENADRYL) 25 MG tablet Take 25 mg by mouth 3 (three) times daily as needed for allergies.    . ferrous sulfate (FERROUSUL) 325 (65 FE) MG tablet Take 1 tablet (325 mg total) by mouth 3 (three) times daily with meals. (Patient not taking: Reported on 03/29/2018)  3  . fluticasone (FLONASE) 50 MCG/ACT nasal spray Place 1 spray into both nostrils daily. (Patient not taking: Reported on 03/29/2018) 16 g 2  . Roflumilast (DALIRESP) 250 MCG TABS Take 1 tablet by mouth daily. (Patient taking differently: Take 2 tablets by mouth daily. ) 28 tablet 0  . triamcinolone cream (KENALOG) 0.1 % Apply 1 application topically 2 (two) times daily. (Patient not taking: Reported on 03/29/2018) 30 g 0  . Lactase (LACTAID FAST ACT) 9000 units TABS Take 9,000 Units by mouth daily as needed (dairy).    . Omega-3 Fatty Acids  (OMEGA-3 FISH OIL PO) Take 2,000 mg by mouth daily.    . polyethylene glycol (MIRALAX / GLYCOLAX) packet Take 17 g by mouth 2 (two) times daily. (Patient not taking: Reported on 03/29/2018) 14 each 0   No facility-administered medications prior to visit.     Allergies  Allergen Reactions  . Losartan Other (See Comments)    hyperkalemia  . Penicillins Hives, Swelling and Rash    Has patient had a PCN reaction causing immediate rash, facial/tongue/throat swelling, SOB or lightheadedness with hypotension: YES Has patient had a PCN reaction causing severe rash involving mucus membranes or skin necrosis: NO Has patient had a PCN reaction that required hospitalization NO Has patient had a PCN reaction occurring within the last 10 years: NO If all of the above answers are "NO", then may proceed with Cephalosporin use.   . Cefixime [Kdc:Cefixime] Other (See Comments)    unspecified  . Indocin [Indomethacin] Other (See Comments)    Painful tongue and throat  . Singulair [Montelukast Sodium]     Chest tightness   . Sulfa Antibiotics Other (See Comments)    unspecified  . Ace Inhibitors Other (See Comments)    cough  . Statins Other (See Comments)    Myalgias     ROS As per HPI  PE: Blood pressure 111/67, pulse 80, temperature (!) 97.4 F (36.3 C), temperature source Oral, resp. rate 16, height 5' (1.524 m), weight 135 lb 12.8 oz (61.6 kg), SpO2 95 %. Gen: Alert, well appearing.  Patient is oriented to person, place, time, and situation. AFFECT: pleasant, lucid thought and speech. Neuro: CN 2-12 intact bilaterally, strength 5/5 in proximal and distal upper extremities and lower extremities bilaterally.  Slight UE tremor bilat (hands/fingers) with arms held outstretched.  No disdiadochokinesis.  Heel-shin-ankle normal bilat.  No ataxia.  Walks fairly slow with cane, without wavering.  Upper extremity and lower extremity DTRs symmetric.  No pronator drift. Dix Halpike: elicited significant  vertigo + nausea but no nystagmus.  R > L. Sx's lasted about 30  seconds.   LABS:    Chemistry      Component Value Date/Time   NA 140 12/10/2017 0559   K 3.8 12/10/2017 0559   CL 109 12/10/2017 0559   CO2 25 12/10/2017 0559   BUN 12 12/10/2017 0559   CREATININE 0.60 12/10/2017 0559   CREATININE 0.84 02/27/2016 1533      Component Value Date/Time   CALCIUM 8.8 (L) 12/10/2017 0559   ALKPHOS 81 09/02/2017 1057   AST 16 09/02/2017 1057   ALT 12 09/02/2017 1057   BILITOT 0.8 09/02/2017 1057     Lab Results  Component Value Date   WBC 13.3 (H) 12/10/2017   HGB 9.9 (L) 12/10/2017   HCT 32.0 (L) 12/10/2017   MCV 91.7 12/10/2017   PLT 205 12/10/2017   Lab Results  Component Value Date   HGBA1C 5.7 10/29/2011   Lab Results  Component Value Date   TSH 0.79 09/02/2017   Lab Results  Component Value Date   CHOL 193 06/18/2015   HDL 61.30 06/18/2015   LDLCALC 106 (H) 06/18/2015   LDLDIRECT 131.3 03/07/2010   TRIG 124.0 06/18/2015   CHOLHDL 3 06/18/2015    IMPRESSION AND PLAN:  BPPV. Has had brief episodes/isolated episodes in the past, milder intensity. Epley maneuvers to do at home discussed with pt today, handout given. Recommended she take her 5mg  valium twice a day until sx's resolve.  Spent 25 min with pt today, with >50% of this time spent in counseling and care coordination regarding the above problems.  An After Visit Summary was printed and given to the patient.  FOLLOW UP: Return if symptoms worsen or fail to improve.  Signed:  Crissie Sickles, MD           03/29/2018

## 2018-04-04 DIAGNOSIS — G609 Hereditary and idiopathic neuropathy, unspecified: Secondary | ICD-10-CM

## 2018-04-04 HISTORY — DX: Hereditary and idiopathic neuropathy, unspecified: G60.9

## 2018-04-06 ENCOUNTER — Ambulatory Visit (INDEPENDENT_AMBULATORY_CARE_PROVIDER_SITE_OTHER): Payer: Medicare Other | Admitting: Family Medicine

## 2018-04-06 ENCOUNTER — Encounter: Payer: Self-pay | Admitting: Family Medicine

## 2018-04-06 VITALS — BP 132/76 | HR 72 | Temp 97.4°F | Resp 16 | Ht 60.0 in | Wt 136.0 lb

## 2018-04-06 DIAGNOSIS — G63 Polyneuropathy in diseases classified elsewhere: Secondary | ICD-10-CM | POA: Diagnosis not present

## 2018-04-06 DIAGNOSIS — M4807 Spinal stenosis, lumbosacral region: Secondary | ICD-10-CM

## 2018-04-06 DIAGNOSIS — R200 Anesthesia of skin: Secondary | ICD-10-CM | POA: Diagnosis not present

## 2018-04-06 NOTE — Progress Notes (Signed)
OFFICE VISIT  04/06/2018   CC:  Chief Complaint  Patient presents with  . numbness in feet    pt states numbness for several months     HPI:    Patient is a 83 y.o. Caucasian female who presents for numbness in feet.  I last saw her 03/29/2018 for BPPV.  Epley's maneuvers and instructions to take her valium 5mg  bid at that time. This has completely resolved.  Reports approx 6 mo hx of feeling numbness in bottoms of both feet.  NO pain. Feels like bottoms of all toes and pads of metatarsal heads and over plantar surface of heels don't feel the floor normally.  Her sense of balance in the feet does feel off sometimes.   Has some chronic pain in midline lower lumbar region but no radiating pain or paresthesias. Plantar flexion of toes is a little weak/impared per pt.   Past Medical History:  Diagnosis Date  . Allergic rhinitis    with upper airway cough: as of 07/2017 pulm f/u she was instructed to use astelin, flonase, and saline more consistently  . BPPV (benign paroxysmal positional vertigo) 03/2018  . Chest pain, non-cardiac    Cardiac CT showed no signif obstructive dz, mildly elevated calcium level (Dr. [name]).  . Chronic cystitis with hematuria    [name]  . Chronic hypoxemic respiratory failure ([place]) 02/02/2015   2L oxygen 24/7  . COPD (chronic obstructive pulmonary disease) ([facility])    GOLD 4.  spirometry 01/10/09 FEV 0.97(52%), FEV1% 47.  With chronic bronchitis as of 07/2017.  . DDD (degenerative disc disease), cervical    1998 MRI C5-6 impingemt (left)  . DDD (degenerative disc disease), lumbar   . Depression with anxiety 04/15/2011  . Diarrheal disease Summer 2017   ? 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).  As of 10/05/15 pt reports GI dx'd her with C diff and rx'd more flagyl and she is also on cholestyramine.  As of 02/2016, pt's sx's resolved  completely.  . Diverticulosis of colon    Procto '97 and colonoscopy 2002  . Dysplastic nevus of upper extremity 04/2014   R tricep ([name])  . Edema of both lower extremities due to peripheral venous insufficiency    Varicosities bilat, swelling L>R.  R baker's cyst.  Got vein clinic eval 02/2018->sclerotherapy recommended but since no hemorrhage or ulceration, insurance will not cover the procedure.  . Family history of adverse reaction to anesthesia    mother died when patient age 89 receiving anesthesia for thyroid surgery  . Fibrocystic breast disease    w/fibroadenoma.  Bx's showed NO atypia (Dr. Margot Chimes).  Mammo neg 2008.  Marland Kitchen GERD (gastroesophageal reflux disease)   . History of double vision    Ophthalmologist, Dr. Herbert Deaner, is further evaluating this with MRI  orbits and limited brain (myesthenia gravis testing neg 07/2010)  . History of home oxygen therapy    uses 2 liters at hs  . Hypertension    EKG 03/2010 normal  . Hypothyroidism    Hashimoto's, dx'd 1989  . Lesion of right native kidney 2017   found on CT 02/2015.  F/u MRI 03/31/15 showed it to be smaller and less likely of any significance but repeat CT renal protocol in 42mo was recommended by radiology.  This was done 10/26/15 and showed no interval change in the lesion, but b/c the lesion enhances with contrast, radiol rec'd urol  consult (b/c pap renal RCC could not be excluded).  Saw urol 03/06/16--stable on u/s 09/2016 and 09/2017.  . Osteoarthritis, hip, bilateral    she is s/p bilat THA as of 02/2018.  Marland Kitchen Pneumonia   . Postmenopausal atrophic vaginitis    improved with estrace 2 X/week.  . Pulmonary nodule    CT chest 12/10, June 2011.  No nodule on CT 06/2010.  Marland Kitchen Recurrent Clostridium difficile diarrhea    Episode 09/19/2016: resolved with prolonged course of flagyl.  11/2016 recurrence treated with 10d of Dificid (Fidaxomicin) by GI (Dr. Earlean Shawl).  . Recurrent UTI    likely related to pt's atrophic vaginitis.  Urol started  vaginal estrace 03/2016.  Marland Kitchen Shingles 02/2014   R abd/flank  . Spinal stenosis of lumbar region with neurogenic claudication    2008 MRI (L3-4, L4-5, L5-S1)    Past Surgical History:  Procedure Laterality Date  . ABDOMINAL HYSTERECTOMY  1978   no BSO per pt--nonmalignant reasons  . APPENDECTOMY    . BREAST CYST EXCISION     Last screening mammogram 10/2010 was normal.  . CHOLECYSTECTOMY  2001  . COLONOSCOPY  04/2005; 12/06/2015   2007 NORMAL.  2017 adenomatous polyp x 1.  Mod sigmoid diverticulosis (Dr. Earlean Shawl).    Marland Kitchen DEXA  05/18/2017   NORMAL (T-score 0.1)  . HIP CLOSED REDUCTION Right 12/25/2015   Procedure: CLOSED REDUCTION HIP;  Surgeon: Nicholes Stairs, MD;  Location: [facility];  Service: Orthopedics;  Laterality: Right;  . TONSILLECTOMY    . TOTAL HIP ARTHROPLASTY Right 11/01/2014   Procedure: RIGHT TOTAL HIP ARTHROPLASTY;  Surgeon: Latanya Maudlin, MD;  Location: [facility];  Service: Orthopedics;  Laterality: Right;  . TOTAL HIP ARTHROPLASTY Left 12/08/2017   Procedure: LEFT TOTAL HIP ARTHROPLASTY ANTERIOR APPROACH;  Surgeon: Paralee Cancel, MD;  Location: [facility];  Service: Orthopedics;  Laterality: Left;  70 mins  . TRANSTHORACIC ECHOCARDIOGRAM  08/2014   EF 60-65%, grade I diast dysfxn  . TUBAL LIGATION  1974    Outpatient Medications Prior to Visit  Medication Sig Dispense Refill  . albuterol (PROAIR HFA) 108 (90 Base) MCG/ACT inhaler Inhale 2 puffs into the lungs every 4 (four) hours as needed for wheezing or shortness of breath. 8.5 g 1  . albuterol (PROVENTIL) (2.5 MG/3ML) 0.083% nebulizer solution INHALE 1 VIAL VIA NEBULIZER EVERY 6 HOURS AS NEEDED FOR WHEEZING OR SHORTNESS OF BREATH 75 mL 1  . amLODipine (NORVASC) 5 MG tablet TAKE 1 TABLET BY MOUTH EVERY DAY 90 tablet 1  . Artificial Tear Solution (SOOTHE XP) SOLN Place 1 drop into both eyes 2 (two) times daily as needed (irritation).    Marland Kitchen azelastine (ASTELIN) 0.1 % nasal spray Place 2 sprays into both nostrils 2 (two) times  daily. Use in each nostril as directed (Patient taking differently: Place 2 sprays into both nostrils 2 (two) times daily as needed for rhinitis or allergies. Use in each nostril as directed) 30 mL 3  . diazepam (VALIUM) 5 MG tablet Take 1 tablet (5 mg total) by mouth at bedtime. May take an additional 5 mg dose as needed for anxiety 60 tablet 5  . diphenhydrAMINE (BENADRYL) 25 MG tablet Take 25 mg by mouth 3 (three) times daily as needed for allergies.    . ferrous sulfate (FERROUSUL) 325 (65 FE) MG tablet Take 1 tablet (325 mg total) by mouth 3 (three) times daily with meals.  3  . fluticasone (FLONASE) 50 MCG/ACT nasal spray Place 1 spray into  both nostrils daily. 16 g 2  . hydrocortisone cream 1 % Apply 1 application topically daily as needed for itching.    Marland Kitchen Lifitegrast (XIIDRA) 5 % SOLN Place 1 drop into both eyes 2 (two) times daily.    . montelukast (SINGULAIR) 10 MG tablet TK 1 T PO QHS    . Probiotic Product (PROBIOTIC DAILY PO) Take 1 capsule by mouth daily.     . Roflumilast (DALIRESP) 250 MCG TABS Take 1 tablet by mouth daily. (Patient taking differently: Take 2 tablets by mouth daily. ) 28 tablet 0  . SPIRIVA HANDIHALER 18 MCG inhalation capsule INHALE 1 CAPSULE VIA HANDIHALER EVERY DAY AT THE SAME TIME 30 capsule 5  . SYNTHROID 88 MCG tablet TAKE 1 TABLET BY MOUTH DAILY 30 tablet 5  . triamcinolone cream (KENALOG) 0.1 % Apply 1 application topically 2 (two) times daily. 30 g 0  . venlafaxine (EFFEXOR) 50 MG tablet TAKE 1 TABLET(50 MG) BY MOUTH TWICE DAILY 180 tablet 3  . WIXELA INHUB 250-50 MCG/DOSE AEPB INHALE 1 PUFF INTO THE LUNGS TWICE DAILY (Patient taking differently: Inhale 1 puff into the lungs 2 (two) times daily. ) 1 each 5   No facility-administered medications prior to visit.     Allergies  Allergen Reactions  . Losartan Other (See Comments)    hyperkalemia  . Penicillins Hives, Swelling and Rash    Has patient had a PCN reaction causing immediate rash,  facial/tongue/throat swelling, SOB or lightheadedness with hypotension: YES Has patient had a PCN reaction causing severe rash involving mucus membranes or skin necrosis: NO Has patient had a PCN reaction that required hospitalization NO Has patient had a PCN reaction occurring within the last 10 years: NO If all of the above answers are "NO", then may proceed with Cephalosporin use.   . Cefixime [Kdc:Cefixime] Other (See Comments)    unspecified  . Indocin [Indomethacin] Other (See Comments)    Painful tongue and throat  . Singulair [Montelukast Sodium]     Chest tightness   . Sulfa Antibiotics Other (See Comments)    unspecified  . Ace Inhibitors Other (See Comments)    cough  . Statins Other (See Comments)    Myalgias     ROS As per HPI  PE: Blood pressure 132/76, pulse 72, temperature (!) 97.4 F (36.3 C), temperature source Oral, resp. rate 16, height 5' (1.524 m), weight 136 lb (61.7 kg), SpO2 95 %. Gen: Alert, well appearing.  Patient is oriented to person, place, time, and situation. AFFECT: pleasant, lucid thought and speech. Ankles: normal ROM and strength, PT pulses 2+. Feet: DP pulses 2+.  No erythema, warmth, or tenderness.  No edema.   No deformity of feet.  Strength in toes 5/5 in flexion and extension bilat.   Sensation generally intact to monofilament, possibly a bit impaired on plantar surfaces of metatarsal head region and heels bilat.  Vibratory sense impaired on both feet diffusely.  Joint position sense intact in toes of both feet.  LABS:    Chemistry      Component Value Date/Time   NA 140 12/10/2017 0559   K 3.8 12/10/2017 0559   CL 109 12/10/2017 0559   CO2 25 12/10/2017 0559   BUN 12 12/10/2017 0559   CREATININE 0.60 12/10/2017 0559   CREATININE 0.84 02/27/2016 1533      Component Value Date/Time   CALCIUM 8.8 (L) 12/10/2017 0559   ALKPHOS 81 09/02/2017 1057   AST 16 09/02/2017 1057  ALT 12 09/02/2017 1057   BILITOT 0.8 09/02/2017 1057      Lab Results  Component Value Date   TSH 0.79 09/02/2017    IMPRESSION AND PLAN:  Numbness in both feet in the distribution of S1 (and L5?) bilaterally. +Hx of spinal stenosis. Reassured pt that this is not likely to be rapidly progressive.  As long as no pain component then no trial of meds for PN.  Her balance/gait is the issue most likely to be of concern as time goes on. At this time, she is very good at being aware of need for extra care with walking/stepping/turning when on feet. Use cane and railings as needed to keep aware of walking surface and ensure balance. If progressive then we will if neuro and/or PT has anything additional to offer.  Spent 25 min with pt today, with >50% of this time spent in counseling and care coordination regarding the above problems.  An After Visit Summary was printed and given to the patient.  FOLLOW UP: Return for keep f/u appt already scheduled later this month.  Signed:  Crissie Sickles, MD           04/06/2018

## 2018-04-08 ENCOUNTER — Other Ambulatory Visit: Payer: Self-pay | Admitting: Family Medicine

## 2018-04-22 ENCOUNTER — Other Ambulatory Visit: Payer: Self-pay | Admitting: Family Medicine

## 2018-04-28 ENCOUNTER — Ambulatory Visit: Payer: Medicare Other | Admitting: Family Medicine

## 2018-05-20 ENCOUNTER — Other Ambulatory Visit: Payer: Self-pay | Admitting: Pulmonary Disease

## 2018-05-24 ENCOUNTER — Ambulatory Visit (INDEPENDENT_AMBULATORY_CARE_PROVIDER_SITE_OTHER): Payer: Medicare Other | Admitting: Family Medicine

## 2018-05-24 ENCOUNTER — Other Ambulatory Visit: Payer: Self-pay | Admitting: Family Medicine

## 2018-05-24 ENCOUNTER — Encounter: Payer: Self-pay | Admitting: Family Medicine

## 2018-05-24 ENCOUNTER — Other Ambulatory Visit: Payer: Self-pay

## 2018-05-24 VITALS — BP 147/90 | HR 85 | Temp 97.2°F | Wt 134.0 lb

## 2018-05-24 DIAGNOSIS — R631 Polydipsia: Secondary | ICD-10-CM

## 2018-05-24 DIAGNOSIS — E039 Hypothyroidism, unspecified: Secondary | ICD-10-CM | POA: Diagnosis not present

## 2018-05-24 DIAGNOSIS — J9611 Chronic respiratory failure with hypoxia: Secondary | ICD-10-CM | POA: Diagnosis not present

## 2018-05-24 DIAGNOSIS — I10 Essential (primary) hypertension: Secondary | ICD-10-CM | POA: Diagnosis not present

## 2018-05-24 DIAGNOSIS — D649 Anemia, unspecified: Secondary | ICD-10-CM | POA: Diagnosis not present

## 2018-05-24 MED ORDER — AMLODIPINE BESYLATE 10 MG PO TABS: 10.0000 mg | ORAL_TABLET | Freq: Every day | ORAL | 1 refills | Status: DC

## 2018-05-24 NOTE — Telephone Encounter (Signed)
Will hold off until pt has lab work and has been resulted to ensure no changes needed.

## 2018-05-24 NOTE — Progress Notes (Signed)
Virtual Visit via Video Note  I connected with pt  on 05/24/18 at  9:30 AM EDT by a video enabled telemedicine application and verified that I am speaking with the correct person using two identifiers.  Location patient: home Location provider:work or home office Persons participating in the virtual visit: patient, provider  I discussed the limitations of evaluation and management by telemedicine and the availability of in person appointments. The patient expressed understanding and agreed to proceed.  Telemedicine visit is a necessity given the COVID-19 restrictions in place at the current time.  Patient Care Team    Relationship Specialty Notifications Start End  [name], MD PCP - General   01/16/10    Comment: Arnetha Gula, MD Consulting Physician Cardiology  06/06/10   Monna Fam, MD Consulting Physician Ophthalmology  01/29/11   Chesley Mires, MD Consulting Physician Pulmonary Disease  12/21/12   Latanya Maudlin, MD Consulting Physician Orthopedic Surgery  09/26/14   Cleon Gustin, MD Consulting Physician Urology  03/10/16   Richmond Campbell, MD Consulting Physician Gastroenterology  10/28/16   Paralee Cancel, MD Consulting Physician Orthopedic Surgery  09/04/17   Linus Mako, MD Consulting Physician Family Medicine  03/03/18    HPI: 83 y/o WF here for 6 mo f/u HTN, Hypothyroidism, and LE venous insufficiency edema. She has hx of postop anemia 12/2017 from her hip surgery. She has COPD, with hx of chronic hypoxic resp failure ([name]).  She has been well, just "bored out of my mind" due to restrictions/COVID crisis.  Not taking any iron.  HTN: home bp monitoring avg 140s/80s, lowest 120/60, highest 160/86.  Breathing is stable: uses oxygen hs and prn activity like housework.  Occ cough of PND, clear.  No wheezing.  No fevers/malaise. She mouth breaths due to chronic mild rhinitis and   Hypoth: TSH's have been normal in the last few years with  annual monitoring.  LE venous ins: says mild swelling stable, wears compression hose daily and this makes her chronic LE numbness/paresthesias a little more bothersome.  ROS: no CP. no dizziness, no HAs, no rashes, no melena/hematochezia.   Excessive thirst, but sounds more like chronic dry mouth.  Urine output appropriate for fluid intake.  No blurry vision. No new myalgias or arthralgias.   Past Medical History:  Diagnosis Date  . Allergic rhinitis    with upper airway cough: as of 07/2017 pulm f/u she was instructed to use astelin, flonase, and saline more consistently  . BPPV (benign paroxysmal positional vertigo) 03/2018  . Chest pain, non-cardiac    Cardiac CT showed no signif obstructive dz, mildly elevated calcium level (Dr. [name]).  . Chronic cystitis with hematuria    [name]  . Chronic hypoxemic respiratory failure ([place]) 02/02/2015   2L oxygen 24/7  . COPD (chronic obstructive pulmonary disease) ([facility])    GOLD 4.  spirometry 01/10/09 FEV 0.97(52%), FEV1% 47.  With chronic bronchitis as of 07/2017.  . DDD (degenerative disc disease), cervical    1998 MRI C5-6 impingemt (left)  . DDD (degenerative disc disease), lumbar   . Depression with anxiety 04/15/2011  . Diarrheal disease Summer 2017   ? 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).  As of 10/05/15 pt reports GI dx'd her with C diff and rx'd more flagyl and she is also on cholestyramine.  As of 02/2016, pt's sx's resolved completely.  . Diverticulosis of  colon    Procto '97 and colonoscopy 2002  . Dysplastic nevus of upper extremity 04/2014   R tricep ([name])  . Edema of both lower extremities due to peripheral venous insufficiency    Varicosities bilat, swelling L>R.  R baker's cyst.  Got vein clinic eval 02/2018->sclerotherapy recommended but since no hemorrhage or ulceration, insurance will not cover the  procedure.  . Family history of adverse reaction to anesthesia    mother died when patient age 87 receiving anesthesia for thyroid surgery  . Fibrocystic breast disease    w/fibroadenoma.  Bx's showed NO atypia (Dr. Margot Chimes).  Mammo neg 2008.  Marland Kitchen GERD (gastroesophageal reflux disease)   . History of double vision    Ophthalmologist, Dr. Herbert Deaner, is further evaluating this with MRI  orbits and limited brain (myesthenia gravis testing neg 07/2010)  . History of home oxygen therapy    uses 2 liters at hs  . Hypertension    EKG 03/2010 normal  . Hypothyroidism    Hashimoto's, dx'd 1989  . Lesion of right native kidney 2017   found on CT 02/2015.  F/u MRI 03/31/15 showed it to be smaller and less likely of any significance but repeat CT renal protocol in 66mo was recommended by radiology.  This was done 10/26/15 and showed no interval change in the lesion, but b/c the lesion enhances with contrast, radiol rec'd urol consult (b/c pap renal RCC could not be excluded).  Saw urol 03/06/16--stable on u/s 09/2016 and 09/2017.  . Numbness in feet 04/2018   S1 distribution bilat, c/w spinal stenosis sensory neuropathy (no pain).  . Osteoarthritis, hip, bilateral    she is s/p bilat THA as of 02/2018.  Marland Kitchen Pneumonia   . Postmenopausal atrophic vaginitis    improved with estrace 2 X/week.  . Pulmonary nodule    CT chest 12/10, June 2011.  No nodule on CT 06/2010.  Marland Kitchen Recurrent Clostridium difficile diarrhea    Episode 09/19/2016: resolved with prolonged course of flagyl.  11/2016 recurrence treated with 10d of Dificid (Fidaxomicin) by GI (Dr. Earlean Shawl).  . Recurrent UTI    likely related to pt's atrophic vaginitis.  Urol started vaginal estrace 03/2016.  Marland Kitchen Shingles 02/2014   R abd/flank  . Spinal stenosis of lumbar region with neurogenic claudication    2008 MRI (L3-4, L4-5, L5-S1).  +S1 distribution sensory loss bilat    Past Surgical History:  Procedure Laterality Date  . ABDOMINAL HYSTERECTOMY  1978   no BSO per  pt--nonmalignant reasons  . APPENDECTOMY    . BREAST CYST EXCISION     Last screening mammogram 10/2010 was normal.  . CHOLECYSTECTOMY  2001  . COLONOSCOPY  04/2005; 12/06/2015   2007 NORMAL.  2017 adenomatous polyp x 1.  Mod sigmoid diverticulosis (Dr. Earlean Shawl).    Marland Kitchen DEXA  05/18/2017   NORMAL (T-score 0.1)  . HIP CLOSED REDUCTION Right 12/25/2015   Procedure: CLOSED REDUCTION HIP;  Surgeon: Nicholes Stairs, MD;  Location: [facility];  Service: Orthopedics;  Laterality: Right;  . TONSILLECTOMY    . TOTAL HIP ARTHROPLASTY Right 11/01/2014   Procedure: RIGHT TOTAL HIP ARTHROPLASTY;  Surgeon: Latanya Maudlin, MD;  Location: [facility];  Service: Orthopedics;  Laterality: Right;  . TOTAL HIP ARTHROPLASTY Left 12/08/2017   Procedure: LEFT TOTAL HIP ARTHROPLASTY ANTERIOR APPROACH;  Surgeon: Paralee Cancel, MD;  Location: [facility];  Service: Orthopedics;  Laterality: Left;  70 mins  . TRANSTHORACIC ECHOCARDIOGRAM  08/2014   EF 60-65%, grade I  diast dysfxn  . TUBAL LIGATION  1974    Family History  Problem Relation Age of Onset  . Goiter Mother   . Psoriasis Father        od liver  . Cirrhosis Father   . Diabetes Daughter        Type 2  . Cancer Paternal Aunt        stomach?  . Stroke Maternal Grandfather   . Stroke Paternal Grandmother   . Hypertension Paternal Grandmother   . Hernia Paternal Grandmother   . Cancer Paternal Grandfather        lung     Current Outpatient Medications:  .  albuterol (PROAIR HFA) 108 (90 Base) MCG/ACT inhaler, Inhale 2 puffs into the lungs every 4 (four) hours as needed for wheezing or shortness of breath., Disp: 8.5 g, Rfl: 1 .  albuterol (PROVENTIL) (2.5 MG/3ML) 0.083% nebulizer solution, INHALE 1 VIAL VIA NEBULIZER EVERY 6 HOURS AS NEEDED FOR WHEEZING OR SHORTNESS OF BREATH, Disp: 75 mL, Rfl: 1 .  amLODipine (NORVASC) 5 MG tablet, TAKE 1 TABLET BY MOUTH EVERY DAY, Disp: 90 tablet, Rfl: 1 .  Artificial Tear Solution (SOOTHE XP) SOLN, Place 1 drop into both eyes  2 (two) times daily as needed (irritation)., Disp: , Rfl:  .  azelastine (ASTELIN) 0.1 % nasal spray, Place 2 sprays into both nostrils 2 (two) times daily. Use in each nostril as directed (Patient taking differently: Place 2 sprays into both nostrils 2 (two) times daily as needed for rhinitis or allergies. Use in each nostril as directed), Disp: 30 mL, Rfl: 3 .  diazepam (VALIUM) 5 MG tablet, Take 1 tablet (5 mg total) by mouth at bedtime. May take an additional 5 mg dose as needed for anxiety, Disp: 60 tablet, Rfl: 5 .  diphenhydrAMINE (BENADRYL) 25 MG tablet, Take 25 mg by mouth 3 (three) times daily as needed for allergies., Disp: , Rfl:  .  ferrous sulfate (FERROUSUL) 325 (65 FE) MG tablet, Take 1 tablet (325 mg total) by mouth 3 (three) times daily with meals., Disp: , Rfl: 3 .  fluticasone (FLONASE) 50 MCG/ACT nasal spray, Place 1 spray into both nostrils daily., Disp: 16 g, Rfl: 2 .  hydrocortisone cream 1 %, Apply 1 application topically daily as needed for itching., Disp: , Rfl:  .  Lifitegrast (XIIDRA) 5 % SOLN, Place 1 drop into both eyes 2 (two) times daily., Disp: , Rfl:  .  montelukast (SINGULAIR) 10 MG tablet, TK 1 T PO QHS, Disp: , Rfl:  .  Probiotic Product (PROBIOTIC DAILY PO), Take 1 capsule by mouth daily. , Disp: , Rfl:  .  Roflumilast (DALIRESP) 250 MCG TABS, Take 1 tablet by mouth daily. (Patient taking differently: Take 2 tablets by mouth daily. ), Disp: 28 tablet, Rfl: 0 .  SPIRIVA HANDIHALER 18 MCG inhalation capsule, INHALE 1 CAPSULE VIA HANDIHALER EVERY DAY AT THE SAME TIME, Disp: 30 capsule, Rfl: 5 .  SYNTHROID 88 MCG tablet, TAKE 1 TABLET BY MOUTH DAILY, Disp: 30 tablet, Rfl: 5 .  triamcinolone cream (KENALOG) 0.1 %, Apply 1 application topically 2 (two) times daily., Disp: 30 g, Rfl: 0 .  venlafaxine (EFFEXOR) 50 MG tablet, TAKE 1 TABLET BY MOUTH  TWICE A DAY, Disp: 180 tablet, Rfl: 0 .  WIXELA INHUB 250-50 MCG/DOSE AEPB, INHALE 1 PUFF INTO THE LUNGS TWICE DAILY, Disp:  60 each, Rfl: 1  EXAM:  VITALS per patient if applicable: BP (!) 371/06 (BP Location: Left Arm,  Patient Position: Sitting, Cuff Size: Normal)   Pulse 85   Temp (!) 97.2 F (36.2 C) (Oral)   Wt 134 lb (60.8 kg)   BMI 26.17 kg/m  BP recheck 145/80 today  GENERAL: alert, oriented, appears well and in no acute distress  HEENT: atraumatic, conjunttiva clear, no obvious abnormalities on inspection of external nose and ears  NECK: normal movements of the head and neck  LUNGS: on inspection no signs of respiratory distress, breathing rate appears normal, no obvious gross SOB, gasping or wheezing  CV: no obvious cyanosis  MS: moves all visible extremities without noticeable abnormality  PSYCH/NEURO: pleasant and cooperative, no obvious depression or anxiety, speech and thought processing grossly intact  LABS: none today    Chemistry      Component Value Date/Time   NA 140 12/10/2017 0559   K 3.8 12/10/2017 0559   CL 109 12/10/2017 0559   CO2 25 12/10/2017 0559   BUN 12 12/10/2017 0559   CREATININE 0.60 12/10/2017 0559   CREATININE 0.84 02/27/2016 1533      Component Value Date/Time   CALCIUM 8.8 (L) 12/10/2017 0559   ALKPHOS 81 09/02/2017 1057   AST 16 09/02/2017 1057   ALT 12 09/02/2017 1057   BILITOT 0.8 09/02/2017 1057     Lab Results  Component Value Date   CHOL 193 06/18/2015   HDL 61.30 06/18/2015   LDLCALC 106 (H) 06/18/2015   LDLDIRECT 131.3 03/07/2010   TRIG 124.0 06/18/2015   CHOLHDL 3 06/18/2015   Lab Results  Component Value Date   WBC 13.3 (H) 12/10/2017   HGB 9.9 (L) 12/10/2017   HCT 32.0 (L) 12/10/2017   MCV 91.7 12/10/2017   PLT 205 12/10/2017   Lab Results  Component Value Date   TSH 0.79 09/02/2017    ASSESSMENT AND PLAN:  Discussed the following assessment and plan:  1) Uncontrolled HTN: not bad, but will increase amlodipine to 10mg  qd. Lytes/cr future. FLP/hepatic panel as well.  2) Postop anemia 12/2017.  Not on any iron since  then. Will check CBC future.  3) Hypothyroidism: compliant with T4, taking correctly. TSH monitoring: future TSH ordered.  4) LE venous insuff edema: stable.  Low Na intake and compression hose daily to be continued.  5) COPD/chronic hypoxic resp failure: stable, doing well on oxygen prn activity and overnight. Compliant with COPD med regimen. She'll keep f/u with pulm as appropriate.  I discussed the assessment and treatment plan with the patient. The patient was provided an opportunity to ask questions and all were answered. The patient agreed with the plan and demonstrated an understanding of the instructions.   The patient was advised to call back or seek an in-person evaluation if the symptoms worsen or if the condition fails to improve as anticipated.  F/u: 1 mo virtual, for f/u uncontrolled HTN  Signed:  Crissie Sickles, MD           05/24/2018

## 2018-05-26 ENCOUNTER — Other Ambulatory Visit: Payer: Self-pay

## 2018-05-26 ENCOUNTER — Other Ambulatory Visit (INDEPENDENT_AMBULATORY_CARE_PROVIDER_SITE_OTHER): Payer: Medicare Other

## 2018-05-26 DIAGNOSIS — J9611 Chronic respiratory failure with hypoxia: Secondary | ICD-10-CM

## 2018-05-26 DIAGNOSIS — E039 Hypothyroidism, unspecified: Secondary | ICD-10-CM | POA: Diagnosis not present

## 2018-05-26 DIAGNOSIS — I10 Essential (primary) hypertension: Secondary | ICD-10-CM

## 2018-05-26 DIAGNOSIS — R631 Polydipsia: Secondary | ICD-10-CM

## 2018-05-26 DIAGNOSIS — D649 Anemia, unspecified: Secondary | ICD-10-CM

## 2018-05-26 LAB — CBC
HCT: 42 % (ref 36.0–46.0)
Hemoglobin: 13.7 g/dL (ref 12.0–15.0)
MCHC: 32.7 g/dL (ref 30.0–36.0)
MCV: 86.9 fl (ref 78.0–100.0)
Platelets: 269 10*3/uL (ref 150.0–400.0)
RBC: 4.83 Mil/uL (ref 3.87–5.11)
RDW: 16 % — ABNORMAL HIGH (ref 11.5–15.5)
WBC: 8.9 10*3/uL (ref 4.0–10.5)

## 2018-05-26 LAB — COMPREHENSIVE METABOLIC PANEL
ALT: 11 U/L (ref 0–35)
AST: 12 U/L (ref 0–37)
Albumin: 4.4 g/dL (ref 3.5–5.2)
Alkaline Phosphatase: 91 U/L (ref 39–117)
BUN: 16 mg/dL (ref 6–23)
CO2: 26 mEq/L (ref 19–32)
Calcium: 9.4 mg/dL (ref 8.4–10.5)
Chloride: 106 mEq/L (ref 96–112)
Creatinine, Ser: 0.82 mg/dL (ref 0.40–1.20)
GFR: 66.9 mL/min (ref >60.00)
Glucose, Bld: 68 mg/dL — ABNORMAL LOW (ref 70–99)
Potassium: 3.9 mEq/L (ref 3.5–5.1)
Sodium: 142 mEq/L (ref 135–145)
Total Bilirubin: 0.6 mg/dL (ref 0.2–1.2)
Total Protein: 6.5 g/dL (ref 6.0–8.3)

## 2018-05-26 LAB — LIPID PANEL
Cholesterol: 211 mg/dL — ABNORMAL HIGH (ref 0–200)
HDL: 68.2 mg/dL (ref >39.00)
LDL Cholesterol: 127 mg/dL — ABNORMAL HIGH (ref 0–99)
NonHDL: 142.41
Total CHOL/HDL Ratio: 3
Triglycerides: 79 mg/dL (ref 0.0–149.0)
VLDL: 15.8 mg/dL (ref 0.0–40.0)

## 2018-05-26 LAB — TSH: TSH: 1.01 u[IU]/mL (ref 0.35–4.50)

## 2018-06-03 ENCOUNTER — Encounter: Payer: Self-pay | Admitting: Family Medicine

## 2018-06-03 ENCOUNTER — Other Ambulatory Visit: Payer: Self-pay

## 2018-06-03 ENCOUNTER — Ambulatory Visit (INDEPENDENT_AMBULATORY_CARE_PROVIDER_SITE_OTHER): Payer: Medicare Other | Admitting: Family Medicine

## 2018-06-03 VITALS — BP 131/75 | HR 79 | Temp 96.0°F | Ht 60.0 in | Wt 135.0 lb

## 2018-06-03 DIAGNOSIS — S161XXA Strain of muscle, fascia and tendon at neck level, initial encounter: Secondary | ICD-10-CM | POA: Diagnosis not present

## 2018-06-03 DIAGNOSIS — R0789 Other chest pain: Secondary | ICD-10-CM | POA: Diagnosis not present

## 2018-06-03 NOTE — Progress Notes (Signed)
Virtual Visit via Video Note  I connected with Priscilla Cantrell on 06/03/18 at  1:40 PM EDT by a video enabled telemedicine application and verified that I am speaking with the correct person using two identifiers.  Location patient: home Location provider:work or home office Persons participating in the virtual visit: patient, provider  I discussed the limitations of evaluation and management by telemedicine and the availability of in person appointments. The patient expressed understanding and agreed to proceed.   HPI: 83 y/o WF who is being seen today for pain in her L side of neck and left breast area that came on after lifting heavy flower pots. Some pain in L side of neck while/after lifting heavy potted plants repeatedly a few days ago. Hurts worse with turning head to R.  Also pain in L upper chest area near sternum last couple days.  No chest pressure. No pain or paresthesias on either arm.  No SOB, diaphoresis, or nausea. No palpitations.  Pain in L chest/sternal area can occur at rest or with walking, lasts only seconds at a time. With palpation of the area she says it is a little tender to palpation, same on R side in same area, "but nothing bad at all". She says she is easily worried and wanted to run all this by me and make sure their was nothing I am worried about. No fever, no cough.  ROS: See pertinent positives and negatives per HPI.  Past Medical History:  Diagnosis Date  . Allergic rhinitis    with upper airway cough: as of 07/2017 pulm f/u she was instructed to use astelin, flonase, and saline more consistently  . BPPV (benign paroxysmal positional vertigo) 03/2018  . Chest pain, non-cardiac    Cardiac CT showed no signif obstructive dz, mildly elevated calcium level (Dr. [name]).  . Chronic cystitis with hematuria    [name]  . Chronic hypoxemic respiratory failure ([place]) 02/02/2015   2L oxygen 24/7  . COPD (chronic obstructive pulmonary disease) ([facility])    GOLD  4.  spirometry 01/10/09 FEV 0.97(52%), FEV1% 47.  With chronic bronchitis as of 07/2017.  . DDD (degenerative disc disease), cervical    1998 MRI C5-6 impingemt (left)  . DDD (degenerative disc disease), lumbar   . Depression with anxiety 04/15/2011  . Diarrheal disease Summer 2017   ? 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).  As of 10/05/15 Priscilla Cantrell reports GI dx'd her with C diff and rx'd more flagyl and she is also on cholestyramine.  As of 02/2016, Priscilla Cantrell's sx's resolved completely.  . Diverticulosis of colon    Procto '97 and colonoscopy 2002  . Dysplastic nevus of upper extremity 04/2014   R tricep ([name])  . Edema of both lower extremities due to peripheral venous insufficiency    Varicosities bilat, swelling L>R.  R baker's cyst.  Got vein clinic eval 02/2018->sclerotherapy recommended but since no hemorrhage or ulceration, insurance will not cover the procedure.  . Family history of adverse reaction to anesthesia    mother died when patient age 6 receiving anesthesia for thyroid surgery  . Fibrocystic breast disease    w/fibroadenoma.  Bx's showed NO atypia (Dr. Margot Chimes).  Mammo neg 2008.  Marland Kitchen GERD (gastroesophageal reflux disease)   . History of double vision    Ophthalmologist, Dr. Herbert Deaner, is further evaluating this with MRI  orbits and limited brain (myesthenia gravis testing neg 07/2010)  .  History of home oxygen therapy    uses 2 liters at hs  . Hypertension    EKG 03/2010 normal  . Hypothyroidism    Hashimoto's, dx'd 1989  . Lesion of right native kidney 2017   found on CT 02/2015.  F/u MRI 03/31/15 showed it to be smaller and less likely of any significance but repeat CT renal protocol in 69mo was recommended by radiology.  This was done 10/26/15 and showed no interval change in the lesion, but b/c the lesion enhances with contrast, radiol rec'd urol consult (b/c pap renal RCC could not be  excluded).  Saw urol 03/06/16--stable on u/s 09/2016 and 09/2017.  . Numbness in feet 04/2018   S1 distribution bilat, c/w spinal stenosis sensory neuropathy (no pain).  . Osteoarthritis, hip, bilateral    she is s/p bilat THA as of 02/2018.  Marland Kitchen Pneumonia   . Postmenopausal atrophic vaginitis    improved with estrace 2 X/week.  . Pulmonary nodule    CT chest 12/10, June 2011.  No nodule on CT 06/2010.  Marland Kitchen Recurrent Clostridium difficile diarrhea    Episode 09/19/2016: resolved with prolonged course of flagyl.  11/2016 recurrence treated with 10d of Dificid (Fidaxomicin) by GI (Dr. Earlean Shawl).  . Recurrent UTI    likely related to Priscilla Cantrell's atrophic vaginitis.  Urol started vaginal estrace 03/2016.  Marland Kitchen Shingles 02/2014   R abd/flank  . Spinal stenosis of lumbar region with neurogenic claudication    2008 MRI (L3-4, L4-5, L5-S1).  +S1 distribution sensory loss bilat    Past Surgical History:  Procedure Laterality Date  . ABDOMINAL HYSTERECTOMY  1978   no BSO per Priscilla Cantrell--nonmalignant reasons  . APPENDECTOMY    . BREAST CYST EXCISION     Last screening mammogram 10/2010 was normal.  . CHOLECYSTECTOMY  2001  . COLONOSCOPY  04/2005; 12/06/2015   2007 NORMAL.  2017 adenomatous polyp x 1.  Mod sigmoid diverticulosis (Dr. Earlean Shawl).    Marland Kitchen DEXA  05/18/2017   NORMAL (T-score 0.1)  . HIP CLOSED REDUCTION Right 12/25/2015   Procedure: CLOSED REDUCTION HIP;  Surgeon: Nicholes Stairs, MD;  Location: [facility];  Service: Orthopedics;  Laterality: Right;  . TONSILLECTOMY    . TOTAL HIP ARTHROPLASTY Right 11/01/2014   Procedure: RIGHT TOTAL HIP ARTHROPLASTY;  Surgeon: Latanya Maudlin, MD;  Location: [facility];  Service: Orthopedics;  Laterality: Right;  . TOTAL HIP ARTHROPLASTY Left 12/08/2017   Procedure: LEFT TOTAL HIP ARTHROPLASTY ANTERIOR APPROACH;  Surgeon: Paralee Cancel, MD;  Location: [facility];  Service: Orthopedics;  Laterality: Left;  70 mins  . TRANSTHORACIC ECHOCARDIOGRAM  08/2014   EF 60-65%, grade I diast dysfxn  .  TUBAL LIGATION  1974    Family History  Problem Relation Age of Onset  . Goiter Mother   . Psoriasis Father        od liver  . Cirrhosis Father   . Diabetes Daughter        Type 2  . Cancer Paternal Aunt        stomach?  . Stroke Maternal Grandfather   . Stroke Paternal Grandmother   . Hypertension Paternal Grandmother   . Hernia Paternal Grandmother   . Cancer Paternal Grandfather        lung     Current Outpatient Medications:  .  albuterol (PROAIR HFA) 108 (90 Base) MCG/ACT inhaler, Inhale 2 puffs into the lungs every 4 (four) hours as needed for wheezing or shortness of breath., Disp: 8.5 g, Rfl:  1 .  albuterol (PROVENTIL) (2.5 MG/3ML) 0.083% nebulizer solution, INHALE 1 VIAL VIA NEBULIZER EVERY 6 HOURS AS NEEDED FOR WHEEZING OR SHORTNESS OF BREATH, Disp: 75 mL, Rfl: 1 .  amLODipine (NORVASC) 10 MG tablet, Take 1 tablet (10 mg total) by mouth daily., Disp: 30 tablet, Rfl: 1 .  Artificial Tear Solution (SOOTHE XP) SOLN, Place 1 drop into both eyes 2 (two) times daily as needed (irritation)., Disp: , Rfl:  .  azelastine (ASTELIN) 0.1 % nasal spray, Place 2 sprays into both nostrils 2 (two) times daily. Use in each nostril as directed (Patient taking differently: Place 2 sprays into both nostrils 2 (two) times daily as needed for rhinitis or allergies. Use in each nostril as directed), Disp: 30 mL, Rfl: 3 .  diazepam (VALIUM) 5 MG tablet, Take 1 tablet (5 mg total) by mouth at bedtime. May take an additional 5 mg dose as needed for anxiety, Disp: 60 tablet, Rfl: 5 .  diphenhydrAMINE (BENADRYL) 25 MG tablet, Take 25 mg by mouth 3 (three) times daily as needed for allergies., Disp: , Rfl:  .  ferrous sulfate (FERROUSUL) 325 (65 FE) MG tablet, Take 1 tablet (325 mg total) by mouth 3 (three) times daily with meals., Disp: , Rfl: 3 .  fluticasone (FLONASE) 50 MCG/ACT nasal spray, Place 1 spray into both nostrils daily., Disp: 16 g, Rfl: 2 .  hydrocortisone cream 1 %, Apply 1 application  topically daily as needed for itching., Disp: , Rfl:  .  Lifitegrast (XIIDRA) 5 % SOLN, Place 1 drop into both eyes 2 (two) times daily., Disp: , Rfl:  .  montelukast (SINGULAIR) 10 MG tablet, TK 1 T PO QHS, Disp: , Rfl:  .  Probiotic Product (PROBIOTIC DAILY PO), Take 1 capsule by mouth daily. , Disp: , Rfl:  .  SPIRIVA HANDIHALER 18 MCG inhalation capsule, INHALE 1 CAPSULE VIA HANDIHALER EVERY DAY AT THE SAME TIME, Disp: 30 capsule, Rfl: 5 .  SYNTHROID 88 MCG tablet, TAKE 1 TABLET BY MOUTH DAILY, Disp: 30 tablet, Rfl: 5 .  triamcinolone cream (KENALOG) 0.1 %, Apply 1 application topically 2 (two) times daily., Disp: 30 g, Rfl: 0 .  venlafaxine (EFFEXOR) 50 MG tablet, TAKE 1 TABLET BY MOUTH  TWICE A DAY, Disp: 180 tablet, Rfl: 0 .  WIXELA INHUB 250-50 MCG/DOSE AEPB, INHALE 1 PUFF INTO THE LUNGS TWICE DAILY, Disp: 60 each, Rfl: 1  EXAM:  VITALS per patient if applicable:  GENERAL: alert, oriented, appears well and in no acute distress  HEENT: atraumatic, conjunttiva clear, no obvious abnormalities on inspection of external nose and ears  NECK: normal movements of the head and neck  LUNGS: on inspection no signs of respiratory distress, breathing rate appears normal, no obvious gross SOB, gasping or wheezing  CV: no obvious cyanosis  MS: moves all visible extremities without noticeable abnormality  PSYCH/NEURO: pleasant and cooperative, no obvious depression or anxiety, speech and thought processing grossly intact  LABS: none today    Chemistry      Component Value Date/Time   NA 142 05/26/2018 0952   K 3.9 05/26/2018 0952   CL 106 05/26/2018 0952   CO2 26 05/26/2018 0952   BUN 16 05/26/2018 0952   CREATININE 0.82 05/26/2018 0952   CREATININE 0.84 02/27/2016 1533      Component Value Date/Time   CALCIUM 9.4 05/26/2018 0952   ALKPHOS 91 05/26/2018 0952   AST 12 05/26/2018 0952   ALT 11 05/26/2018 0952   BILITOT 0.6  05/26/2018 0952     Lab Results  Component Value Date    WBC 8.9 05/26/2018   HGB 13.7 05/26/2018   HCT 42.0 05/26/2018   MCV 86.9 05/26/2018   PLT 269.0 05/26/2018    ASSESSMENT AND PLAN:  Discussed the following assessment and plan:  1) Acute neck strain on L and acute musculoskeletal chest wall pain on L. No red flags for neurologic or cardiopulmonary problem. Reassured Priscilla Cantrell.   Recommended gentle, slow ROM of neck so it gradually works the involved muscles. Apply heat to neck and L medial chest wall 20 min qd-bid. Tylenol for pain if needed, but currently she says this doesn't feel necessary b/c her pains are so brief/intermittent.  I discussed the assessment and treatment plan with the patient. The patient was provided an opportunity to ask questions and all were answered. The patient agreed with the plan and demonstrated an understanding of the instructions.   The patient was advised to call back or seek an in-person evaluation if the symptoms worsen or if the condition fails to improve as anticipated.  F/u: if not improving or if new sx's.  Signed:  Crissie Sickles, MD           06/03/2018

## 2018-06-15 ENCOUNTER — Ambulatory Visit: Payer: Self-pay | Admitting: *Deleted

## 2018-06-15 NOTE — Telephone Encounter (Signed)
Please call patient regarding notes from triage nurse. Advise if she needs a virtual appt or can provider call in something for vertigo and post nasal drip or advise something OTC to take  Thank you

## 2018-06-15 NOTE — Telephone Encounter (Signed)
Please advise, thanks.

## 2018-06-15 NOTE — Telephone Encounter (Signed)
Patient scheduled for telephone visit with PCP regarding vertigo

## 2018-06-15 NOTE — Telephone Encounter (Signed)
Pt reports mild vertigo this AM,. States H/O and she does [employer] as directed by Dr. Anitra Lauth. States effective. States she has "A lot of phlegm iin my throat and I'm concerned with my COPD." Denies cough, no SOB, states afebrile. On home O2, sats 97% on 2Ls, 92% on RA. States she tried to reach her pulmonologist, unable to do so. Also states she is allergic to cats and was around cats yesterday. [place] called practice, [facility], call transferred for consideration of appt. Pt does not have access to computer.  Reason for Disposition . [1] MILD dizziness (e.g., walking normally) AND [2] has been evaluated by physician for this  Answer Assessment - Initial Assessment Questions 1. DESCRIPTION: "Describe your dizziness."     Vertigo h/o 2. LIGHTHEADED: "Do you feel lightheaded?" (e.g., somewhat faint, woozy, weak upon standing)    yes 3. VERTIGO: "Do you feel like either you or the room is spinning or tilting?" (i.e. vertigo)    yes 4. SEVERITY: "How bad is it?"  "Do you feel like you are going to faint?" "Can you stand and walk?"   - MILD - walking normally   - MODERATE - interferes with normal activities (e.g., work, school)    - SEVERE - unable to stand, requires support to walk, feels like passing out now.      mild 5. ONSET:  "When did the dizziness begin?"     0530 6. AGGRAVATING FACTORS: "Does anything make it worse?" (e.g., standing, change in head position)     *positional 7. HEART RATE: "Can you tell me your heart rate?" "How many beats in 15 seconds?"  (Note: not all patients can do this)        8. CAUSE: "What do you think is causing the dizziness?"    H/O 9. RECURRENT SYMPTOM: "Have you had dizziness before?" If so, ask: "When was the last time?" "What happened that time?"    Occurs  Occasionally. 10. OTHER SYMPTOMS: "Do you have any other symptoms?" (e.g., fever, chest pain, vomiting, diarrhea, bleeding)       "Phelgm in throat."  Protocols used: DIZZINESS [facility]

## 2018-06-16 ENCOUNTER — Encounter: Payer: Self-pay | Admitting: Family Medicine

## 2018-06-16 ENCOUNTER — Ambulatory Visit (INDEPENDENT_AMBULATORY_CARE_PROVIDER_SITE_OTHER): Payer: Medicare Other | Admitting: Family Medicine

## 2018-06-16 ENCOUNTER — Other Ambulatory Visit: Payer: Self-pay

## 2018-06-16 VITALS — BP 143/81 | HR 77 | Wt 135.0 lb

## 2018-06-16 DIAGNOSIS — J309 Allergic rhinitis, unspecified: Secondary | ICD-10-CM

## 2018-06-16 NOTE — Telephone Encounter (Signed)
Noted  

## 2018-06-16 NOTE — Progress Notes (Addendum)
Virtual Visit via Video Note  I connected with Priscilla Cantrell on 06/16/18 at  2:00 PM EDT by telephone and verified that I am speaking with the correct person using two identifiers.  Location patient: home Location provider:work or home office Persons participating in the virtual visit: patient, provider  I discussed the limitations of evaluation and management by telemedicine/telephone and the availability of in person appointments. The patient expressed understanding and agreed to proceed.  Telemedicine/telephone visit is a necessity given the COVID-19 restrictions in place at the current time.  HPI: 83 y/o WF with whom I am doing a telephone visit today (due to COVID-19 pandemic restrictions). She has had a 36h period of increased nasal congestion/nasal and throat dryness, some sneezing, a bit of PND.  Has been around her daughter's cat in the last 1-2 days. No ST, cough, fever, or wheezing.  No SOB or DOE.  She is fearful that this is going to turn into bronchitis/COPD flare.  Her oxygen has been 94% or more on RA at home. She does drop into upper 80s/low 90s when she walks her dog in yard yesterday, but this is not a new thing for her.  She says she slept well last night propped up a bit more than normal and throat was not dry this morning like it had been.  Feels significantly improved today.    ROS: See pertinent positives and negatives per HPI.  Past Medical History:  Diagnosis Date  . Allergic rhinitis    with upper airway cough: as of 07/2017 pulm f/u she was instructed to use astelin, flonase, and saline more consistently  . BPPV (benign paroxysmal positional vertigo) 03/2018  . Chest pain, non-cardiac    Cardiac CT showed no signif obstructive dz, mildly elevated calcium level (Dr. [name]).  . Chronic cystitis with hematuria    [name]  . Chronic hypoxemic respiratory failure ([place]) 02/02/2015   2L oxygen 24/7  . COPD (chronic obstructive pulmonary disease) ([facility])     GOLD 4.  spirometry 01/10/09 FEV 0.97(52%), FEV1% 47.  With chronic bronchitis as of 07/2017.  . DDD (degenerative disc disease), cervical    1998 MRI C5-6 impingemt (left)  . DDD (degenerative disc disease), lumbar   . Depression with anxiety 04/15/2011  . Diarrheal disease Summer 2017   ? 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).  As of 10/05/15 Priscilla Cantrell reports GI dx'd her with C diff and rx'd more flagyl and she is also on cholestyramine.  As of 02/2016, Priscilla Cantrell's sx's resolved completely.  . Diverticulosis of colon    Procto '97 and colonoscopy 2002  . Dysplastic nevus of upper extremity 04/2014   R tricep ([name])  . Edema of both lower extremities due to peripheral venous insufficiency    Varicosities bilat, swelling L>R.  R baker's cyst.  Got vein clinic eval 02/2018->sclerotherapy recommended but since no hemorrhage or ulceration, insurance will not cover the procedure.  . Family history of adverse reaction to anesthesia    mother died when patient age 71 receiving anesthesia for thyroid surgery  . Fibrocystic breast disease    w/fibroadenoma.  Bx's showed NO atypia (Dr. Margot Chimes).  Mammo neg 2008.  Marland Kitchen GERD (gastroesophageal reflux disease)   . History of double vision    Ophthalmologist, Dr. Herbert Deaner, is further evaluating this with MRI  orbits and limited brain (myesthenia gravis testing neg 07/2010)  . History of home oxygen therapy  uses 2 liters at hs  . Hypertension    EKG 03/2010 normal  . Hypothyroidism    Hashimoto's, dx'd 1989  . Lesion of right native kidney 2017   found on CT 02/2015.  F/u MRI 03/31/15 showed it to be smaller and less likely of any significance but repeat CT renal protocol in 60mo was recommended by radiology.  This was done 10/26/15 and showed no interval change in the lesion, but b/c the lesion enhances with contrast, radiol rec'd urol consult (b/c pap renal RCC could not  be excluded).  Saw urol 03/06/16--stable on u/s 09/2016 and 09/2017.  . Numbness in feet 04/2018   S1 distribution bilat, c/w spinal stenosis sensory neuropathy (no pain).  . Osteoarthritis, hip, bilateral    she is s/p bilat THA as of 02/2018.  Marland Kitchen Pneumonia   . Postmenopausal atrophic vaginitis    improved with estrace 2 X/week.  . Pulmonary nodule    CT chest 12/10, June 2011.  No nodule on CT 06/2010.  Marland Kitchen Recurrent Clostridium difficile diarrhea    Episode 09/19/2016: resolved with prolonged course of flagyl.  11/2016 recurrence treated with 10d of Dificid (Fidaxomicin) by GI (Dr. Earlean Shawl).  . Recurrent UTI    likely related to Priscilla Cantrell's atrophic vaginitis.  Urol started vaginal estrace 03/2016.  Marland Kitchen Shingles 02/2014   R abd/flank  . Spinal stenosis of lumbar region with neurogenic claudication    2008 MRI (L3-4, L4-5, L5-S1).  +S1 distribution sensory loss bilat    Past Surgical History:  Procedure Laterality Date  . ABDOMINAL HYSTERECTOMY  1978   no BSO per Priscilla Cantrell--nonmalignant reasons  . APPENDECTOMY    . BREAST CYST EXCISION     Last screening mammogram 10/2010 was normal.  . CHOLECYSTECTOMY  2001  . COLONOSCOPY  04/2005; 12/06/2015   2007 NORMAL.  2017 adenomatous polyp x 1.  Mod sigmoid diverticulosis (Dr. Earlean Shawl).    Marland Kitchen DEXA  05/18/2017   NORMAL (T-score 0.1)  . HIP CLOSED REDUCTION Right 12/25/2015   Procedure: CLOSED REDUCTION HIP;  Surgeon: Nicholes Stairs, MD;  Location: [facility];  Service: Orthopedics;  Laterality: Right;  . TONSILLECTOMY    . TOTAL HIP ARTHROPLASTY Right 11/01/2014   Procedure: RIGHT TOTAL HIP ARTHROPLASTY;  Surgeon: Latanya Maudlin, MD;  Location: [facility];  Service: Orthopedics;  Laterality: Right;  . TOTAL HIP ARTHROPLASTY Left 12/08/2017   Procedure: LEFT TOTAL HIP ARTHROPLASTY ANTERIOR APPROACH;  Surgeon: Paralee Cancel, MD;  Location: [facility];  Service: Orthopedics;  Laterality: Left;  70 mins  . TRANSTHORACIC ECHOCARDIOGRAM  08/2014   EF 60-65%, grade I diast dysfxn   . TUBAL LIGATION  1974    Family History  Problem Relation Age of Onset  . Goiter Mother   . Psoriasis Father        od liver  . Cirrhosis Father   . Diabetes Daughter        Type 2  . Cancer Paternal Aunt        stomach?  . Stroke Maternal Grandfather   . Stroke Paternal Grandmother   . Hypertension Paternal Grandmother   . Hernia Paternal Grandmother   . Cancer Paternal Grandfather        lung      Current Outpatient Medications:  .  albuterol (PROAIR HFA) 108 (90 Base) MCG/ACT inhaler, Inhale 2 puffs into the lungs every 4 (four) hours as needed for wheezing or shortness of breath., Disp: 8.5 g, Rfl: 1 .  amLODipine (NORVASC) 10 MG  tablet, Take 1 tablet (10 mg total) by mouth daily., Disp: 30 tablet, Rfl: 1 .  Artificial Tear Solution (SOOTHE XP) SOLN, Place 1 drop into both eyes 2 (two) times daily as needed (irritation)., Disp: , Rfl:  .  azelastine (ASTELIN) 0.1 % nasal spray, Place 2 sprays into both nostrils 2 (two) times daily. Use in each nostril as directed (Patient taking differently: Place 2 sprays into both nostrils 2 (two) times daily as needed for rhinitis or allergies. Use in each nostril as directed), Disp: 30 mL, Rfl: 3 .  diazepam (VALIUM) 5 MG tablet, Take 1 tablet (5 mg total) by mouth at bedtime. May take an additional 5 mg dose as needed for anxiety, Disp: 60 tablet, Rfl: 5 .  fluticasone (FLONASE) 50 MCG/ACT nasal spray, Place 1 spray into both nostrils daily., Disp: 16 g, Rfl: 2 .  hydrocortisone cream 1 %, Apply 1 application topically daily as needed for itching., Disp: , Rfl:  .  Lifitegrast (XIIDRA) 5 % SOLN, Place 1 drop into both eyes 2 (two) times daily., Disp: , Rfl:  .  Probiotic Product (PROBIOTIC DAILY PO), Take 1 capsule by mouth daily. , Disp: , Rfl:  .  SPIRIVA HANDIHALER 18 MCG inhalation capsule, INHALE 1 CAPSULE VIA HANDIHALER EVERY DAY AT THE SAME TIME, Disp: 30 capsule, Rfl: 5 .  SYNTHROID 88 MCG tablet, TAKE 1 TABLET BY MOUTH DAILY,  Disp: 30 tablet, Rfl: 5 .  triamcinolone cream (KENALOG) 0.1 %, Apply 1 application topically 2 (two) times daily., Disp: 30 g, Rfl: 0 .  venlafaxine (EFFEXOR) 50 MG tablet, TAKE 1 TABLET BY MOUTH  TWICE A DAY, Disp: 180 tablet, Rfl: 0 .  WIXELA INHUB 250-50 MCG/DOSE AEPB, INHALE 1 PUFF INTO THE LUNGS TWICE DAILY, Disp: 60 each, Rfl: 1 .  albuterol (PROVENTIL) (2.5 MG/3ML) 0.083% nebulizer solution, INHALE 1 VIAL VIA NEBULIZER EVERY 6 HOURS AS NEEDED FOR WHEEZING OR SHORTNESS OF BREATH (Patient not taking: Reported on 06/16/2018), Disp: 75 mL, Rfl: 1 .  diphenhydrAMINE (BENADRYL) 25 MG tablet, Take 25 mg by mouth 3 (three) times daily as needed for allergies., Disp: , Rfl:  .  ferrous sulfate (FERROUSUL) 325 (65 FE) MG tablet, Take 1 tablet (325 mg total) by mouth 3 (three) times daily with meals. (Patient not taking: Reported on 06/16/2018), Disp: , Rfl: 3 .  montelukast (SINGULAIR) 10 MG tablet, TK 1 T PO QHS, Disp: , Rfl:   EXAM:  VITALS per patient if applicable: BP (!) 185/63 (BP Location: Left Arm, Patient Position: Sitting, Cuff Size: Normal)   Pulse 77   Wt 135 lb (61.2 kg)   BMI 26.37 kg/m   Gen: alert, lucid thought and speech, no SOB while talking to me, no audible wheezing with deep breaths.  ASSESSMENT AND PLAN:  Discussed the following assessment and plan:  Allergic rhinitis, has been around cats the 1-2 days prior to worsening of her chronic allergic rhinitis sx's. No sign of URI or COPD flare.  Priscilla Cantrell very anxious, admits she needed reassurance. We'll have her call back at the first signs of COPD flare: cough, wheezing, or SOB-->she has NONE of these now. No new med recommendations today.  I discussed the assessment and treatment plan with the patient. The patient was provided an opportunity to ask questions and all were answered. The patient agreed with the plan and demonstrated an understanding of the instructions.   The patient was advised to call back or seek an in-person  evaluation if the  symptoms worsen or if the condition fails to improve as anticipated.  Spent 15 min with Priscilla Cantrell today, with >50% of this time spent in counseling and care coordination regarding the above problems.  F/u: as needed.  Signed:  Crissie Sickles, MD           06/16/2018

## 2018-06-23 ENCOUNTER — Other Ambulatory Visit: Payer: Self-pay | Admitting: Family Medicine

## 2018-06-24 DIAGNOSIS — H04123 Dry eye syndrome of bilateral lacrimal glands: Secondary | ICD-10-CM | POA: Diagnosis not present

## 2018-06-24 DIAGNOSIS — H1045 Other chronic allergic conjunctivitis: Secondary | ICD-10-CM | POA: Diagnosis not present

## 2018-06-24 DIAGNOSIS — D229 Melanocytic nevi, unspecified: Secondary | ICD-10-CM | POA: Diagnosis not present

## 2018-06-24 DIAGNOSIS — L299 Pruritus, unspecified: Secondary | ICD-10-CM | POA: Diagnosis not present

## 2018-06-24 DIAGNOSIS — H16223 Keratoconjunctivitis sicca, not specified as Sjogren's, bilateral: Secondary | ICD-10-CM | POA: Diagnosis not present

## 2018-06-29 ENCOUNTER — Ambulatory Visit: Payer: Self-pay

## 2018-06-29 NOTE — Telephone Encounter (Signed)
Message from Jeri Cos sent at 06/29/2018 12:35 PM EDT   Summary: High pulse readings   Pt has been keeping track of her pulse. She has recorded her pulse as being 92,97,98,100 x 2 times, and currently 87. Her oxygen level is 91-92. Pt would like to know if these levels are ok or is there a possible issue. Pt does have COPD.

## 2018-06-29 NOTE — Telephone Encounter (Signed)
Patient called and says she misread the oxygen and pulse rate on on her oximeter. She says she was reading her heart rate as what her oxygen level was. I asked her to check her readings now, she says her pulse is 90, oxygen rate is 92-98. Advised to call back if any other questions or concerns.

## 2018-07-05 ENCOUNTER — Ambulatory Visit: Payer: Medicare Other | Admitting: Pulmonary Disease

## 2018-07-12 ENCOUNTER — Telehealth: Payer: Self-pay | Admitting: Family Medicine

## 2018-07-12 NOTE — Telephone Encounter (Signed)
Patient left a VM stating she is having trouble with her new bp medicine. Patient states in the VM that she has been taking her bp and would like a  CB to discuss.

## 2018-07-12 NOTE — Telephone Encounter (Signed)
Patient states that since starting amlodipine 10 mg she has felt lousy, no energy, and a bunch of verigo.  Patient went back to 5mg  amlodipine last Wednesday and has started feeling somewhat better at the beginning of this week.  She states her BP 110/65, 109/63, 119/68, and 114/65. I advised patient to continue on amlodipine 5mg  and to continue checking BP and HR.     Patient advised that Dr Anitra Lauth will be back 07/20/2018.  Dr Anitra Lauth, Please advise further instructions upon your return to office.

## 2018-07-13 DIAGNOSIS — N3021 Other chronic cystitis with hematuria: Secondary | ICD-10-CM | POA: Diagnosis not present

## 2018-07-13 DIAGNOSIS — R3915 Urgency of urination: Secondary | ICD-10-CM | POA: Diagnosis not present

## 2018-07-13 NOTE — Telephone Encounter (Signed)
I Agree with the plan of taking amlodipine 5 mg every day. Her blood pressures look excellent. Reassure her.

## 2018-07-14 NOTE — Telephone Encounter (Signed)
Pt was advised to continue taking Amlodipine 5mg  qd and blood pressures were excellent.

## 2018-07-16 ENCOUNTER — Telehealth: Payer: Self-pay | Admitting: Family Medicine

## 2018-07-16 NOTE — Telephone Encounter (Signed)
Patient experiencing urinary frequency and trace of blood in urine. No appointments available at [facility]. Patient will to come to [facility]. Please advise.

## 2018-07-16 NOTE — Telephone Encounter (Signed)
Patient called back. She was able to get an appt with her urologist.

## 2018-07-21 ENCOUNTER — Other Ambulatory Visit: Payer: Self-pay | Admitting: Pulmonary Disease

## 2018-07-29 ENCOUNTER — Other Ambulatory Visit: Payer: Self-pay

## 2018-07-29 ENCOUNTER — Ambulatory Visit (INDEPENDENT_AMBULATORY_CARE_PROVIDER_SITE_OTHER): Payer: Medicare Other | Admitting: Pulmonary Disease

## 2018-07-29 ENCOUNTER — Encounter: Payer: Self-pay | Admitting: Pulmonary Disease

## 2018-07-29 VITALS — BP 110/78 | HR 82 | Temp 98.2°F | Ht 60.0 in | Wt 134.8 lb

## 2018-07-29 DIAGNOSIS — J449 Chronic obstructive pulmonary disease, unspecified: Secondary | ICD-10-CM

## 2018-07-29 DIAGNOSIS — J9611 Chronic respiratory failure with hypoxia: Secondary | ICD-10-CM | POA: Diagnosis not present

## 2018-07-29 DIAGNOSIS — Z79899 Other long term (current) drug therapy: Secondary | ICD-10-CM

## 2018-07-29 DIAGNOSIS — J309 Allergic rhinitis, unspecified: Secondary | ICD-10-CM

## 2018-07-29 MED ORDER — ROFLUMILAST 500 MCG PO TABS: 500.0000 ug | ORAL_TABLET | Freq: Every day | ORAL | 11 refills | Status: DC

## 2018-07-29 MED ORDER — DALIRESP 250 MCG PO TABS: 500.0000 ug | ORAL_TABLET | Freq: Every day | ORAL | 0 refills | Status: DC

## 2018-07-29 NOTE — Progress Notes (Signed)
@Patient  ID: Priscilla Cantrell, female    DOB: 1936/01/11, 83 y.o.   MRN: [id number]  Chief Complaint  Patient presents with   Follow-up    COPD follow-up    Referring provider: [name], MD  HPI:  83 year old female former smoker followed in our office for GOLD III COPD (based on: Spirometry 05/14/17 >> FEV1 0.91 (55%), FEV1% 48), nocturnal hypoxemia  PMH: GERD, hip pain, osteoarthritis Smoker/ Smoking History: Former smoker.  Quit in 1992.  70 pack years. Maintenance: Spiriva, Wixella, Daliresp  [name]  07/29/2018  - Visit   83 year old female former smoker presenting to office today as a follow-up visit.  Patient reports that overall she feels that her breathing is doing well.  She has had some increased congestion.  She is coughing very little.  She is maintained on weeks Festus Holts, Spiriva as well as Daliresp.  She is wondering if we have any Daliresp samples today as the medication is quite expensive.  Patient also having occasional bouts of wheezing.  Patient has not completed full pulmonary function test yet.  She is 2 sets of office spirometry's.  MMRC - Breathlessness Score 3 - I stop for breath after walking about 100 yards or after a few minutes on level ground (isle at grocery store is 128ft)    Tests:   10/12/2017-chest x-ray-stable chronic lung disease no acute findings with bronchitis, allergic rhinitis, nocturnal hypoxemia  Pulmonary tests Spirometry 01/10/09>>FEV1 0.97(52%), FEV1% 47 March 2012>>Daliresp started RAST 05/04/17 >> negative, IgE 8 Spirometry 05/14/17 >> FEV1 0.91 (55%), FEV1% 48  Sleep tests PSG 11/14/11 >> AHI 0.3 ONO with RA 07/31/16 >> test time 6 hrs 41 min.  Average SpO2 87%, low SpO2 74%.  Spent 4 hrs 13 min with SpO2 < 88%.  Cardiac tests Echo 08/30/14 >> EF 60 to 83%, grade 1 diastolic dysfx, PAS 24 mmHG  SIX MIN WALK 07/29/2018 07/29/2016 02/08/2015 11/17/2014  Supplimental Oxygen during Test? (L/min) - No No No  Tech Comments:  Pt walked at a good pace. Pt dropped at 2nd lap and more on 3rd lap. O2 appilied after 3rd lap, walked 1 lap at 2 liters, maintained O2 sat above 92. pt walked at a slow pace - no desat - no c/o SOB, wheezing or chest tx--amg At end of 1st lap, o2 sat 88% on RA with pulse of 96.  pt placed on 2lpm continuous o2 with resting o2 sat of 98% and pulse of 76.  After ambulating 1 lap on 2lpm cont o2, o2 sat 91% with pulse of 97.  Lowest o2 sat during this lap was 91% on 2lpm.  Walk stopped after 1 lap on o2 per pt's request d/t weakness from recent hip surgery.   pt unable to complete test d/t recent hip replacement     FENO:  No results found for: NITRICOXIDE  PFT: No flowsheet data found.  Imaging: No results found.    Specialty Problems      Pulmonary Problems   Allergic rhinitis    Qualifier: Diagnosis of  By: [name] MD, [name]        COPD GOLD III B    Spirometry 01/10/09>>FEV1 0.97(52%), FEV1% 47 March 2012>>Daliresp started RAST 05/04/17 >> negative, IgE 8 Spirometry 05/14/17 >> FEV1 0.91 (55%), FEV1% 48       Hypoxia   SOB (shortness of breath)   Chronic hypoxemic respiratory failure (HCC)    ONO with RA 07/31/16 >> test time 6 hrs 41  min.  Average SpO2 87%, low SpO2 74%.  Spent 4 hrs 13 min with SpO2 < 88%.         Allergies  Allergen Reactions   Losartan Other (See Comments)    hyperkalemia   Penicillins Hives, Swelling and Rash    Has patient had a PCN reaction causing immediate rash, facial/tongue/throat swelling, SOB or lightheadedness with hypotension: YES Has patient had a PCN reaction causing severe rash involving mucus membranes or skin necrosis: NO Has patient had a PCN reaction that required hospitalization NO Has patient had a PCN reaction occurring within the last 10 years: NO If all of the above answers are "NO", then may proceed with Cephalosporin use.    Cefixime [Kdc:Cefixime] Other (See Comments)    unspecified   Indocin [Indomethacin] Other  (See Comments)    Painful tongue and throat   Singulair [Montelukast Sodium]     Chest tightness    Sulfa Antibiotics Other (See Comments)    unspecified   Ace Inhibitors Other (See Comments)    cough   Statins Other (See Comments)    Myalgias     Immunization History  Administered Date(s) Administered   Influenza Split 11/04/2010, 10/23/2011   Influenza Whole 10/04/2009   Influenza, High Dose Seasonal PF 11/07/2016, 11/04/2017   Influenza,inj,Quad PF,6+ Mos 10/07/2012, 10/11/2014   Influenza-Unspecified 11/07/2013, 11/01/2015   Pneumococcal Conjugate-13 04/29/2013   Pneumococcal Polysaccharide-23 02/03/2005, 11/28/2010   Td 02/04/2008, 03/07/2010   Zoster 02/10/2013   Zoster Recombinat (Shingrix) 10/30/2016, 12/29/2016    Past Medical History:  Diagnosis Date   Allergic rhinitis    with upper airway cough: as of 07/2017 pulm f/u she was instructed to use astelin, flonase, and saline more consistently   BPPV (benign paroxysmal positional vertigo) 03/2018   Chest pain, non-cardiac    Cardiac CT showed no signif obstructive dz, mildly elevated calcium level (Dr. [name]).   Chronic cystitis with hematuria    [name]   Chronic hypoxemic respiratory failure ([place]) 02/02/2015   2L oxygen 24/7   COPD (chronic obstructive pulmonary disease) (HCC)    GOLD 4.  spirometry 01/10/09 FEV 0.97(52%), FEV1% 47.  With chronic bronchitis as of 07/2017.   DDD (degenerative disc disease), cervical    1998 MRI C5-6 impingemt (left)   DDD (degenerative disc disease), lumbar    Depression with anxiety 04/15/2011   Diarrheal disease Summer 2017   ? 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).  As of 10/05/15 pt reports GI dx'd her with C diff and rx'd more flagyl and she is also on cholestyramine.  As of 02/2016, pt's sx's resolved completely.   Diverticulosis of  colon    Procto '97 and colonoscopy 2002   Dysplastic nevus of upper extremity 04/2014   R tricep ([name])   Edema of both lower extremities due to peripheral venous insufficiency    Varicosities bilat, swelling L>R.  R baker's cyst.  Got vein clinic eval 02/2018->sclerotherapy recommended but since no hemorrhage or ulceration, insurance will not cover the procedure.   Family history of adverse reaction to anesthesia    mother died when patient age 26 receiving anesthesia for thyroid surgery   Fibrocystic breast disease    w/fibroadenoma.  Bx's showed NO atypia (Dr. Margot Chimes).  Mammo neg 2008.   GERD (gastroesophageal reflux disease)    History of double vision    Ophthalmologist, Dr. Herbert Deaner, is further evaluating this  with MRI  orbits and limited brain (myesthenia gravis testing neg 07/2010)   History of home oxygen therapy    uses 2 liters at hs   Hypertension    EKG 03/2010 normal   Hypothyroidism    Hashimoto's, dx'd 1989   Lesion of right native kidney 2017   found on CT 02/2015.  F/u MRI 03/31/15 showed it to be smaller and less likely of any significance but repeat CT renal protocol in 22mo was recommended by radiology.  This was done 10/26/15 and showed no interval change in the lesion, but b/c the lesion enhances with contrast, radiol rec'd urol consult (b/c pap renal RCC could not be excluded).  Saw urol 03/06/16--stable on u/s 09/2016 and 09/2017.   Numbness in feet 04/2018   S1 distribution bilat, c/w spinal stenosis sensory neuropathy (no pain).   Osteoarthritis, hip, bilateral    she is s/p bilat THA as of 02/2018.   Pneumonia    Postmenopausal atrophic vaginitis    improved with estrace 2 X/week.   Pulmonary nodule    CT chest 12/10, June 2011.  No nodule on CT 06/2010.   Recurrent Clostridium difficile diarrhea    Episode 09/19/2016: resolved with prolonged course of flagyl.  11/2016 recurrence treated with 10d of Dificid (Fidaxomicin) by GI (Dr. Earlean Shawl).    Recurrent UTI    likely related to pt's atrophic vaginitis.  Urol started vaginal estrace 03/2016.   Shingles 02/2014   R abd/flank   Spinal stenosis of lumbar region with neurogenic claudication    2008 MRI (L3-4, L4-5, L5-S1).  +S1 distribution sensory loss bilat    Tobacco History: Social History   Tobacco Use  Smoking Status Former Smoker   Packs/day: 1.50   Years: 34.00   Pack years: 51.00   Types: Cigarettes   Start date: 65   Quit date: 02/03/1990   Years since quitting: 28.5  Smokeless Tobacco Never Used   Counseling given: Yes   Continue to not smoke  Outpatient Encounter Medications as of 07/29/2018  Medication Sig   albuterol (PROAIR HFA) 108 (90 Base) MCG/ACT inhaler Inhale 2 puffs into the lungs every 4 (four) hours as needed for wheezing or shortness of breath.   albuterol (PROVENTIL) (2.5 MG/3ML) 0.083% nebulizer solution INHALE 1 VIAL VIA NEBULIZER EVERY 6 HOURS AS NEEDED FOR WHEEZING OR SHORTNESS OF BREATH   amLODipine (NORVASC) 10 MG tablet Take 1 tablet (10 mg total) by mouth daily.   Artificial Tear Solution (SOOTHE XP) SOLN Place 1 drop into both eyes 2 (two) times daily as needed (irritation).   azelastine (ASTELIN) 0.1 % nasal spray Place 2 sprays into both nostrils 2 (two) times daily. Use in each nostril as directed (Patient taking differently: Place 2 sprays into both nostrils 2 (two) times daily as needed for rhinitis or allergies. Use in each nostril as directed)   diazepam (VALIUM) 5 MG tablet Take 1 tablet (5 mg total) by mouth at bedtime. May take an additional 5 mg dose as needed for anxiety   diphenhydrAMINE (BENADRYL) 25 MG tablet Take 25 mg by mouth 3 (three) times daily as needed for allergies.   ferrous sulfate (FERROUSUL) 325 (65 FE) MG tablet Take 1 tablet (325 mg total) by mouth 3 (three) times daily with meals.   fluticasone (FLONASE) 50 MCG/ACT nasal spray Place 1 spray into both nostrils daily.   hydrocortisone cream 1 %  Apply 1 application topically daily as needed for itching.   Lifitegrast (XIIDRA) 5 %  SOLN Place 1 drop into both eyes 2 (two) times daily.   montelukast (SINGULAIR) 10 MG tablet TK 1 T PO QHS   Probiotic Product (PROBIOTIC DAILY PO) Take 1 capsule by mouth daily.    SPIRIVA HANDIHALER 18 MCG inhalation capsule INHALE 1 CAPSULE VIA HANDIHALER EVERY DAY AT THE SAME TIME   SYNTHROID 88 MCG tablet TAKE 1 TABLET BY MOUTH DAILY   triamcinolone cream (KENALOG) 0.1 % Apply 1 application topically 2 (two) times daily.   venlafaxine (EFFEXOR) 50 MG tablet TAKE 1 TABLET BY MOUTH  TWICE DAILY   WIXELA INHUB 250-50 MCG/DOSE AEPB INHALE 1 PUFF INTO THE LUNGS TWICE DAILY   Roflumilast (DALIRESP) 250 MCG TABS Take 500 mcg by mouth daily.   roflumilast (DALIRESP) 500 MCG TABS tablet Take 1 tablet (500 mcg total) by mouth daily.   [DISCONTINUED] roflumilast (DALIRESP) 500 MCG TABS tablet Take 1 tablet (500 mcg total) by mouth daily.   No facility-administered encounter medications on file as of 07/29/2018.      Review of Systems  Review of Systems  Constitutional: Negative for chills, fatigue, fever and unexpected weight change.  HENT: Positive for congestion. Negative for postnasal drip, sinus pressure and sinus pain.   Respiratory: Positive for cough, shortness of breath and wheezing. Negative for chest tightness.   Cardiovascular: Negative for chest pain and palpitations.  Gastrointestinal: Negative for blood in stool, diarrhea, nausea and vomiting.  Genitourinary: Negative for dysuria, frequency and urgency.  Musculoskeletal: Negative for arthralgias.  Skin: Negative for color change.  Allergic/Immunologic: Negative for environmental allergies and food allergies.  Neurological: Negative for dizziness, light-headedness and headaches.  Psychiatric/Behavioral: Negative for dysphoric mood. The patient is not nervous/anxious.   All other systems reviewed and are negative.    Physical  Exam  BP 110/78    Pulse 82    Temp 98.2 F (36.8 C)    Ht 5' (1.524 m)    Wt 134 lb 12.8 oz (61.1 kg)    SpO2 93%    BMI 26.33 kg/m   Wt Readings from Last 5 Encounters:  07/29/18 134 lb 12.8 oz (61.1 kg)  06/16/18 135 lb (61.2 kg)  06/03/18 135 lb (61.2 kg)  05/24/18 134 lb (60.8 kg)  04/06/18 136 lb (61.7 kg)     Physical Exam  Constitutional: She is oriented to person, place, and time and well-developed, well-nourished, and in no distress. No distress.  HENT:  Head: Normocephalic and atraumatic.  Right Ear: Hearing, tympanic membrane, external ear and ear canal normal.  Left Ear: Hearing, tympanic membrane, external ear and ear canal normal.  Nose: Nose normal.  Mouth/Throat: Uvula is midline and oropharynx is clear and moist. No oropharyngeal exudate.  Eyes: Pupils are equal, round, and reactive to light.  Neck: Normal range of motion. Neck supple.  Cardiovascular: Normal rate, regular rhythm and normal heart sounds.  Pulmonary/Chest: Effort normal and breath sounds normal. No accessory muscle usage. No respiratory distress. She has no decreased breath sounds. She has no wheezes. She has no rhonchi. She has no rales.  Abdominal: Soft. Bowel sounds are normal. She exhibits no distension. There is no abdominal tenderness.  Musculoskeletal: Normal range of motion.        General: No edema.  Lymphadenopathy:    She has no cervical adenopathy.  Neurological: She is alert and oriented to person, place, and time. Gait normal.  Skin: Skin is warm and dry. She is not diaphoretic. No erythema.  Psychiatric: Mood, memory, affect and  judgment normal.  Nursing note and vitals reviewed.     Lab Results:  CBC    Component Value Date/Time   WBC 8.9 05/26/2018 0952   RBC 4.83 05/26/2018 0952   HGB 13.7 05/26/2018 0952   HCT 42.0 05/26/2018 0952   PLT 269.0 05/26/2018 0952   MCV 86.9 05/26/2018 0952   MCH 28.4 12/10/2017 0559   MCHC 32.7 05/26/2018 0952   RDW 16.0 (H)  05/26/2018 0952   LYMPHSABS 2.0 09/02/2017 1057   MONOABS 0.4 09/02/2017 1057   EOSABS 0.1 09/02/2017 1057   BASOSABS 0.0 09/02/2017 1057    BMET    Component Value Date/Time   NA 142 05/26/2018 0952   K 3.9 05/26/2018 0952   CL 106 05/26/2018 0952   CO2 26 05/26/2018 0952   GLUCOSE 68 (L) 05/26/2018 0952   BUN 16 05/26/2018 0952   CREATININE 0.82 05/26/2018 0952   CREATININE 0.84 02/27/2016 1533   CALCIUM 9.4 05/26/2018 0952   GFRNONAA >60 12/10/2017 0559   GFRAA >60 12/10/2017 0559    BNP    Component Value Date/Time   BNP 78.4 01/24/2015 1217    ProBNP No results found for: PROBNP    Assessment & Plan:   COPD GOLD III B Plan: Continue Wixella, Spiriva, Daliresp Continue Singulair, daily and histamine Need to complete pulmonary function testing [facility] for [facility] assistance as well as medication management  Chronic hypoxemic respiratory failure ([place]) Plan: Continue oxygen therapy as prescribed Wear oxygen with exertion, 2 L Order for POC today  Allergic rhinitis Plan: Continue Singulair Continue daily antihistamine Can use Flonase 1 spray each nostril as needed for nasal congestion  Medication management Plan: Local pharmacy saying that patient will need a prior authorization for [place] Ref[facility] for help with medication cost    Return in about 3 months (around 10/29/2018), or if symptoms worsen or fail to improve, for Follow up with Wyn Quaker FNP-C, Follow up with [name], Follow up for PFT.   Lauraine Rinne, NP 07/29/2018   This appointment was 42 minutes long with over 50% of the time in direct face-to-face patient care, assessment, plan of care, and follow-up.

## 2018-07-29 NOTE — Patient Instructions (Addendum)
Pulmonary Function test ordered today - complete in 3 months   Walk in office today   Referral to [facility] for medication cost help   Continue Wixella >>> Rinse mouth after use  Continue Spiriva  Continue Daliresp    Continue Singulair  Continue daily antihistamine  Note your daily symptoms >remember "red flags" for COPD:  >>>Increase in cough >>>increase in sputum production >>>increase in shortness of breath or activity  intolerance.   If you notice these symptoms, please call the office to be seen.     Return in about 3 months (around 10/29/2018), or if symptoms worsen or fail to improve, for Follow up with Wyn Quaker FNP-C, Follow up with [name], Follow up for PFT.   Coronavirus (COVID-19) Are you at risk?  Are you at risk for the Coronavirus (COVID-19)?  To be considered HIGH RISK for Coronavirus (COVID-19), you have to meet the following criteria:  . Traveled to [place], [place], [place], [place] or [place]; or in the [place] to [place], [place], [place], or [place]; and have fever, cough, and shortness of breath within the last 2 weeks of travel OR . Been in close contact with a person diagnosed with COVID-19 within the last 2 weeks and have fever, cough, and shortness of breath . IF YOU DO NOT MEET THESE CRITERIA, YOU ARE CONSIDERED LOW RISK FOR COVID-19.  What to do if you are HIGH RISK for COVID-19?  Marland Kitchen If you are having a medical emergency, call 911. . Seek medical care right away. Before you go to a doctor's office, urgent care or emergency department, call ahead and tell them about your recent travel, contact with someone diagnosed with COVID-19, and your symptoms. You should receive instructions from your physician's office regarding next steps of care.  . When you arrive at healthcare provider, tell the healthcare staff immediately you have returned from visiting [place], [place], [place], [place] or [place]; or traveled in the [place] to  [place], [place], [place], or [place]; in the last two weeks or you have been in close contact with a person diagnosed with COVID-19 in the last 2 weeks.   . Tell the health care staff about your symptoms: fever, cough and shortness of breath. . After you have been seen by a medical provider, you will be either: o Tested for (COVID-19) and discharged home on quarantine except to seek medical care if symptoms worsen, and asked to  - Stay home and avoid contact with others until you get your results (4-5 days)  - Avoid travel on public transportation if possible (such as bus, train, or airplane) or o Sent to the Emergency Department by EMS for evaluation, COVID-19 testing, and possible admission depending on your condition and test results.  What to do if you are LOW RISK for COVID-19?  Reduce your risk of any infection by using the same precautions used for avoiding the common cold or flu:  Marland Kitchen Wash your hands often with soap and warm water for at least 20 seconds.  If soap and water are not readily available, use an alcohol-based hand sanitizer with at least 60% alcohol.  . If coughing or sneezing, cover your mouth and nose by coughing or sneezing into the elbow areas of your shirt or coat, into a tissue or into your sleeve (not your hands). . Avoid shaking hands with others and consider head nods or verbal greetings only. . Avoid touching your eyes, nose, or mouth  with unwashed hands.  . Avoid close contact with people who are sick. . Avoid places or events with large numbers of people in one location, like concerts or sporting events. . Carefully consider travel plans you have or are making. . If you are planning any travel outside or inside the [place], visit the CDC's Travelers' Health webpage for the latest health notices. . If you have some symptoms but not all symptoms, continue to monitor at home and seek medical attention if your symptoms worsen. . If you are having a medical  emergency, call 911.   [place] / e-Visit: [email or web address]         [facility]: [facility]: [phone]                   [facility]: [phone]           It is flu season:   >>> Best ways to protect herself from the flu: Receive the yearly flu vaccine, practice good hand hygiene washing with soap and also using hand sanitizer when available, eat a nutritious meals, get adequate rest, hydrate appropriately   Please contact the office if your symptoms worsen or you have concerns that you are not improving.   Thank you for choosing [facility] for your healthcare, and for allowing [place] to partner with you on your healthcare journey. I am thankful to be able to provide care to you today.   Wyn Quaker FNP-C

## 2018-07-29 NOTE — Assessment & Plan Note (Addendum)
Plan: Continue Wixella, Spiriva, Daliresp Continue Singulair, daily and histamine Need to complete pulmonary function testing [facility] for COPD assistance as well as medication management

## 2018-07-29 NOTE — Assessment & Plan Note (Addendum)
Plan: Continue oxygen therapy as prescribed Wear oxygen with exertion, 2 L Order for POC today

## 2018-07-29 NOTE — Assessment & Plan Note (Signed)
Plan: Local pharmacy saying that patient will need a prior authorization for Daliresp [facility] for help with medication cost

## 2018-07-29 NOTE — Assessment & Plan Note (Signed)
Plan: Continue Singulair Continue daily antihistamine Can use Flonase 1 spray each nostril as needed for nasal congestion

## 2018-07-30 ENCOUNTER — Telehealth: Payer: Self-pay | Admitting: Pulmonary Disease

## 2018-07-30 NOTE — Telephone Encounter (Signed)
07/30/2018 1337  Priscilla Cantrell, can you please start a prior Auth for the patient for Daliresp.  The pharmacy states the patient needs this.  Patient will also be working with [facility] for medication management and med cost assistance.  Wyn Quaker, FNP

## 2018-08-02 ENCOUNTER — Other Ambulatory Visit: Payer: Self-pay | Admitting: *Deleted

## 2018-08-02 ENCOUNTER — Encounter: Payer: Self-pay | Admitting: *Deleted

## 2018-08-02 NOTE — Patient Outreach (Signed)
[facility]) Care Management  08/02/2018  ELSA PLOCH [birth year] [id number]  Telephone Screen  Referral Date: 07/30/18 (in RN CM absence) Referral Source: [facility], Wyn Quaker  Referral Reason: COPD - Med Management - Drug Cost   Diagnoses of COPD/ Pneumonia   [facility] for COPD assistance as well as medication management Expected date of contact 1-3 days (reserved for hospital discharges) Insurance: medicare and [place]   Patient returned a call to [facility] RN CM Patient is able to verify HIPAA Reviewed and addressed MD referral to [facility] with patient  [facility] RN CM reviewed all [facility] services. Mrs Annie Main confirms she is also has contact from [place] of [place] health care program care pathways q 4 6 weeks, but agrees to [place] coach RN CM services for COPD education.  Mrs Talwar reports she has not had a COPD exacerbation/bronchitis episode since the last time she detailed cleaned her home. She is aware that dust and cat dander are triggers for her COPD exacerbation She has not been hospitalized in 4 years She had a selective surgery for her hip in 2019.   Social: Mrs Javier is widowed and retired She has support of her daughter and grandson. She is independent with all care needs and transportation at this time. 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[place] and volunteered in a [employer] She reports having a short hair dog for 2 years  but denies noting any allergic reactions  Conditions: COPD GOLD III, Former smoker, GERD, hip pain, osteoarthritis, HTN, chronic venous insufficiency, allergic rhinitis, hypoxia, oral thrush, hypothyroidism due to hashimoto's thyroiditis, sciatica, UTI hx, depression with  anxiety, s/p left THA, KA, renal mass, constipation  DME:   Portable oxygen canisters x 2, home  concentrator She uses her oxygen when doing house work 3 years ago started O2 2 L [place] after a hospitalization  [facility] provides her oxygen supplies Discussed smaller portable tank options  She discussed that her present portable tanks are heavy She is already aware that [employer] is unable to assist with a smaller tank option  Has life alert  pulse oximeter is worn to check her pulse and oxygen rate prn Nebulizer   Medications:   Mr Annie Main is aware that she is not in the donut hole for v She reports having a letter she received on 08/01/18 indicating what she spent so far in 2020 related to her coverage She has a good support from her local [facility] staff She reports [facility] and a prior authorization will be needed from the pulmonology office from either [facility] or Dr [name]   Appointments:  Last virtual visit via video with Dr Shawnie Dapper on 06/16/18   Last pulmonology visit with Wyn Quaker on 07/29/18    Advance Directives: Denies need for assist with advance directives   Consent: [facility] RN CM reviewed [facility] services with patient. Patient gave verbal consent for services for [facility] telephonic RN CM, [facility] pharmacy and [facility] health coach RN CM     Plan [facility] RN CM will refer Mrs Rouillard to [place] related to medication assistance for [facility] RN CM will refer Mrs Schoeller to [facility] health coach RN CM per  pt's preference for more education and management on COPD   Pt encouraged to return a call to [facility] RN CM prn  [facility] RN CM followed up with [employer] portable tanks [place] RN CM left a voice message for Mrs Morss discussing the options to either purchase or rent this DME after the co payments, deductibles and 20% is paid upfront when calling [phone]. An order will be needed from an MD and the representative will work with the MD for forms and filling with  medicare    Routed note to MDs  Joelene Millin L. Lavina Hamman, RN, BSN, [place] Coordinator Office number [phone] Mobile number [phone]  Main [facility] number [phone] Fax number [phone]

## 2018-08-02 NOTE — Telephone Encounter (Signed)
PA request received from [facility] requested: daliresp CMM Key: [id number] PA request has been sent to plan, and a determination is expected within  days.   Routing to myself for follow-up.

## 2018-08-02 NOTE — Patient Outreach (Signed)
[facility]) Care Management  08/02/2018  MARCIEL OFFENBERGER 1936/01/13 [id number]   Telephone Screen  Referral Date: 07/30/18 (in RN CM absence) Referral Source: [facility], Wyn Quaker  Referral Reason: COPD - Med Management - Drug Cost   Diagnoses of COPD/ Pneumonia   [facility]erral [facility] for COPD assistance as well as medication management Expected date of contact 1-3 days (reserved for hospital discharges) Insurance: medicare and [employer] attempt # 1 No answer. [facility] RN CM left HIPAA compliant voicemail message along with CM's contact info.    Conditions: COPD GOLD III, Former smoker, GERD, hip pain, osteoarthritis, HTN, chronic venous insufficiency, allergic rhinitis, hypoxia, oral thruch, hypothyroidism due to hashimoto's thyroiditis, sciatica, UTI hx, depression with anxiety, s/p left THA, KA, reanl mass, constipation   Medications:   Daliresp medication concerns    Plan: [facility] RN CM sent an unsuccessful outreach letter and scheduled this patient for another call attempt within 4 business days    [place] L. Lavina Hamman, RN, BSN, [place] Coordinator Office number [phone] Mobile number [phone]  Main [facility] number [phone] Fax number [phone]

## 2018-08-03 ENCOUNTER — Other Ambulatory Visit: Payer: Self-pay | Admitting: Pharmacy Technician

## 2018-08-03 ENCOUNTER — Ambulatory Visit: Payer: Self-pay | Admitting: *Deleted

## 2018-08-03 ENCOUNTER — Other Ambulatory Visit: Payer: Self-pay

## 2018-08-03 ENCOUNTER — Other Ambulatory Visit: Payer: Self-pay | Admitting: Pharmacist

## 2018-08-03 ENCOUNTER — Ambulatory Visit: Payer: Self-pay | Admitting: Pharmacist

## 2018-08-03 NOTE — Patient Outreach (Signed)
[facility]) Care Management  08/03/2018  MIRIAN CASCO Jul 14, 1935 [id number]                          Medication Assistance Referral  Referral From: [place]  Medication/Company: [name] / [place] Patient application portion:  Mailed Provider application portion: Faxed  to B. Warner Mccreedy, FNP   Medication/Company: N/A / [place] Patient application portion:  Mailed Provider application portion:  N/A in process of clarifying medication change, will fax provider portion upon decision   Follow up:  Will follow up with patient in 7-10 business days to confirm application(s) have been received.  Maud Deed Chana Bode Yonah Certified Pharmacy Technician [place] Management Direct Dial:[phone]

## 2018-08-03 NOTE — Patient Outreach (Addendum)
[facility]) Care Management  [place]   08/03/2018  KAELAH HAYASHI 23-Mar-1935 [id number]  Reason for referral: Medication Assistance with Daliresp  Referral source: Dr. Wyn Quaker Current insurance: [facility]  PMHx includes but not limited to:  COPD on home 02, former smoker (51 pack years), GERD, OA s/p BL THA, allergic rhinitis, depression / anxiety, HTN, hypothyroidism,x shingles, CDiff  Outreach:  Successful telephone call with Kerry Dory.  HIPAA identifiers verified.   Subjective:  Patient reports she is having difficulty affording Daliresp but is able to pay for her other inhalers, Wixela + Spiriva.  She reports she received ~ 2 months of samples of Daliresp last week at office visit with pulmonary.   Pulmonary office submitted paperwork for PA for Daliresp to pharmacy, no update yet on approval.     Objective: The ASCVD Risk score Mikey Bussing DC Jr., et al., 2013) failed to calculate for the following reasons:   The 2013 ASCVD risk score is only valid for ages 20 to 74  Lab Results  Component Value Date   CREATININE 0.82 05/26/2018   CREATININE 0.60 12/10/2017   CREATININE 0.67 12/09/2017    Lab Results  Component Value Date   HGBA1C 5.7 10/29/2011    Lipid Panel     Component Value Date/Time   CHOL 211 (H) 05/26/2018 0952   TRIG 79.0 05/26/2018 0952   HDL 68.20 05/26/2018 0952   CHOLHDL 3 05/26/2018 0952   VLDL 15.8 05/26/2018 0952   LDLCALC 127 (H) 05/26/2018 0952   LDLDIRECT 131.3 03/07/2010 1526    BP Readings from Last 3 Encounters:  07/29/18 110/78  06/16/18 (!) 143/81  06/03/18 131/75    Allergies  Allergen Reactions  . Losartan Other (See Comments)    hyperkalemia  . Penicillins Hives, Swelling and Rash    Has patient had a PCN reaction causing immediate rash, facial/tongue/throat swelling, SOB or lightheadedness with hypotension: YES Has patient had a PCN reaction causing severe rash involving mucus  membranes or skin necrosis: NO Has patient had a PCN reaction that required hospitalization NO Has patient had a PCN reaction occurring within the last 10 years: NO If all of the above answers are "NO", then may proceed with Cephalosporin use.   . Cefixime [Kdc:Cefixime] Other (See Comments)    unspecified  . Indocin [Indomethacin] Other (See Comments)    Painful tongue and throat  . Singulair [Montelukast Sodium]     Chest tightness   . Sulfa Antibiotics Other (See Comments)    unspecified  . Ace Inhibitors Other (See Comments)    cough  . Statins Other (See Comments)    Myalgias     Medications Reviewed Today    Reviewed by Barbaraann Faster, RN (Registered Nurse) on 08/02/18 at 4  Med List Status: <None>  Medication Order Taking? Sig Documenting Provider Last Dose Status Informant  albuterol (PROAIR HFA) 108 (90 Base) MCG/ACT inhaler [id number] No Inhale 2 puffs into the lungs every 4 (four) hours as needed for wheezing or shortness of breath. [name], MD Taking Active   albuterol (PROVENTIL) (2.5 MG/3ML) 0.083% nebulizer solution [id number] No INHALE 1 VIAL VIA NEBULIZER EVERY 6 HOURS AS NEEDED FOR WHEEZING OR SHORTNESS OF BREATH McGowen, Adrian Blackwater, MD Taking Active Self  amLODipine (NORVASC) 10 MG tablet [id number] No Take 1 tablet (10 mg total) by mouth daily. McGowen, Adrian Blackwater, MD Taking Active   Artificial Tear Solution (SOOTHE XP) SOLN [id number] No Place  1 drop into both eyes 2 (two) times daily as needed (irritation). [provider] Taking Active Self  azelastine (ASTELIN) 0.1 % nasal spray [id number] No Place 2 sprays into both nostrils 2 (two) times daily. Use in each nostril as directed  Patient taking differently: Place 2 sprays into both nostrils 2 (two) times daily as needed for rhinitis or allergies. Use in each nostril as directed   McGowen, Adrian Blackwater, MD Taking Active Self  diazepam (VALIUM) 5 MG tablet [id number] No Take 1 tablet (5 mg total) by mouth  at bedtime. May take an additional 5 mg dose as needed for anxiety McGowen, Adrian Blackwater, MD Taking Active   diphenhydrAMINE (BENADRYL) 25 MG tablet [id number] No Take 25 mg by mouth 3 (three) times daily as needed for allergies. [provider] Taking Active Self  ferrous sulfate (FERROUSUL) 325 (65 FE) MG tablet [id number] No Take 1 tablet (325 mg total) by mouth 3 (three) times daily with meals. Danae Orleans, PA-C Taking Active   fluticasone (FLONASE) 50 MCG/ACT nasal spray [id number] No Place 1 spray into both nostrils daily. Chesley Mires, MD Taking Active Self  hydrocortisone cream 1 % [id number] No Apply 1 application topically daily as needed for itching. [provider] Taking Active Self  Lifitegrast Shirley Friar) 5 % SOLN [id number] No Place 1 drop into both eyes 2 (two) times daily. [provider] Taking Active Self  montelukast (SINGULAIR) 10 MG tablet [id number] No TK 1 T PO QHS [provider] Taking Active   Probiotic Product (PROBIOTIC DAILY PO) [id number] No Take 1 capsule by mouth daily.  [provider] Taking Active Self  Roflumilast (DALIRESP) 250 MCG TABS [id number]  Take 500 mcg by mouth daily. Lauraine Rinne, NP  Active   roflumilast (DALIRESP) 500 MCG TABS tablet [id number]  Take 1 tablet (500 mcg total) by mouth daily. Lauraine Rinne, NP  Active   [facility] HANDIHALER 18 MCG inhalation capsule [id number] No INHALE 1 CAPSULE VIA HANDIHALER EVERY DAY AT THE SAME TIME Chesley Mires, MD Taking Active   SYNTHROID 88 MCG tablet [id number] No TAKE 1 TABLET BY MOUTH DAILY McGowen, Adrian Blackwater, MD Taking Active   triamcinolone cream (KENALOG) 0.1 % [id number] No Apply 1 application topically 2 (two) times daily. Howard Pouch A, DO Taking Active Self  venlafaxine (EFFEXOR) 50 MG tablet [id number] No TAKE 1 TABLET BY MOUTH  TWICE DAILY McGowen, Adrian Blackwater, MD Taking Active   WIXELA INHUB 250-50 MCG/DOSE AEPB [id number] No INHALE 1 PUFF INTO THE LUNGS TWICE DAILY Chesley Mires, MD Taking Active           Assessment: Drugs sorted by system:  Neurologic/Psychologic: diazepam, venlafaxine, diphenhydramine  Hematologic: aspirin 37m  Cardiovascular: amlodipine  Pulmonary/Allergy: albuterol inhaler + nebulizer, Wixela inhaler (fluticasone-salmeterol), Spiriva (tiotropium) handihaler, roflumilast, montelukast, fluticasone NS, azelastine NS  Gastrointestinal: probiotic   Endocrine: synthroid  Topical: fluorometholone eye drops, artifical tear solution, triamcinolone cream  Medication Review Findings:  -Amlodipine: updated dose to 556m patient reports she could not tolerate 1047mue to orthostasis, MD aware -Ferrous sulfate, hydrocortisone cream, liftegrast solution: removed from medication list as patient no longer taking.    Medication Assistance Findings:  Medication assistance needs identified: Daliresp, inhalers if eligible  Extra Help:  Not eligible for Extra Help Low Income Subsidy based on reported income and assets  Patient Assistance Programs: Daliresp made by [place] met: Yes o Out-of-pocket prescription expenditure met:   Yes - Patient has met application requirements  to apply for this patient assistance program.   - Reviewed program requirements with patient.    Wixela has no known patient assistance program.  Could substitute to Advair made by [place] requirement met: Yes o Out-of-pocket prescription expenditure met:   Yes - Patient has met application requirements to apply for this patient assistance program.   - Reviewed program requirements with patient.    Spiriva made by [place] requirement met: No o Out-of-pocket prescription expenditure met:   Not Applicable - Patient has not met application requirements to apply for this patient assistance program at this time. - Alternative option is to apply for a similar product in the same therapeutic class which does not have a  required out of pocket expenditure.  A possible substitution is  Incruse Ellipta  made by [place] .  - Reviewed program requirements with patient.   - Can also include Ventolin into application as this is made by [place].     Additional medication assistance options reviewed with patient as warranted:  Administrator, sports.  Patient voiced understanding.  She will f/u with her insurance to see if she has any OTC benefits.    Plan: . Will route note to Dr. Warner Mccreedy regarding possible substitutions.  for Advair, Ventolin, Incruse . Will mail patient copy of OTC catalogue   Ralene Bathe, PharmD, [place] [phone]

## 2018-08-04 ENCOUNTER — Telehealth: Payer: Self-pay | Admitting: Pulmonary Disease

## 2018-08-04 ENCOUNTER — Other Ambulatory Visit: Payer: Self-pay | Admitting: Pharmacist

## 2018-08-04 MED ORDER — TRELEGY ELLIPTA 100-62.5-25 MCG/INH IN AEPB: 1.0000 | INHALATION_SPRAY | Freq: Every day | RESPIRATORY_TRACT | 11 refills | Status: DC

## 2018-08-04 NOTE — Patient Outreach (Addendum)
[facility]) Care Management  [place]  08/04/2018  Priscilla Cantrell 1935-04-22 [id number]  Reason for call: f/u regarding patient assistance program options for inhalers.  After discussion with pulmonary office, best possible Wixela and Spiriva inhaler substitutions include:   1) Trelegy (one inhaler with 3 ingredients: ICS, LAMA, LABA)   2) Breo Ellipta (ICS, LABA) + Incruse Ellipta (LAMA).    All products through [place] which patient will qualify for based on reported income.    Outreach:  Unsuccessful telephone call attempt #1 to patient.   HIPAA compliant voicemail left requesting a return call  Plan:  F/u with patient again early next week if no call back.   Ralene Bathe, PharmD, [place] [phone]   Addendum: Incoming call from patient.  She is agreeable to try Trelegy inhaler.  Will update provider and [facility] pharmacy technician.    Ralene Bathe, PharmD, [place] [phone]

## 2018-08-04 NOTE — Telephone Encounter (Signed)
Daliresp 500mg  was approved through 02-03-2019 through [facility] reference = [id number] further needed

## 2018-08-04 NOTE — Telephone Encounter (Signed)
Received fax regarding needing an RX for trelegy sent to [facility]. I will print out have provider sign and fax back.

## 2018-08-04 NOTE — Telephone Encounter (Signed)
08/04/2018 7425  Received message from [facility] pharmacist that patient also potentially will qualify for [place] patient assistance through [place].  Still proceed forward with Daliresp PA just in case.  Wyn Quaker FNP

## 2018-08-04 NOTE — Telephone Encounter (Signed)
Aaron Edelman out of office until 08/09/2018 Derl Barrow NP signed Rx to be sent back to [facility]

## 2018-08-09 ENCOUNTER — Telehealth: Payer: Self-pay | Admitting: Pulmonary Disease

## 2018-08-09 MED ORDER — ROFLUMILAST 500 MCG PO TABS: 500.0000 ug | ORAL_TABLET | Freq: Every day | ORAL | 11 refills | Status: DC

## 2018-08-09 NOTE — Telephone Encounter (Signed)
Received paperwork on Daliresp from [facility]. Rx printed and signed. Faxed back to [facility] 08/09/2018. Nothing further needed at this time

## 2018-08-09 NOTE — Telephone Encounter (Signed)
08/09/2018 1407  Lauren please see message below from [facility].  Patient will be transition to Trelegy Ellipta as well as Daliresp.  Be on the look out for the paperwork sent from [place].  Wyn Quaker, FNP

## 2018-08-09 NOTE — Telephone Encounter (Signed)
-----   Message from Rudean Haskell, [facility] sent at 08/04/2018  9:50 AM EDT ----- Patient on board with Trelegy.  Caryl Pina, could you please fax over paperwork for Daliresp and Trelegy to Brian's office?  Thank you!  ----- Message ----- From: Lauraine Rinne, NP Sent: 08/04/2018   9:03 AM EDT To: Rudean Haskell, RPH  Thank you  Had no idea!  Aaron Edelman  ----- Message ----- From: Rudean Haskell, [facility] Sent: 08/04/2018   8:55 AM EDT To: Lauraine Rinne, NP  I called patient this morning and left her messages.  Will let you know her preference as soon as I am able to speak with her.  We went ahead and mailed her the [place] and [place] applications to sign to get that process started and can fill in medication names after decision made.    Yup, Advair is [place] too!  There are so many products  / companies out there.  I have to use a reference guide we made to keep them all straight.     ----- Message ----- From: Lauraine Rinne, NP Sent: 08/03/2018   5:10 PM EDT To: Rudean Haskell, [place] whatever her preference is. Trelegy would be most ideal as lower ICS, still GSK, and one inhaler vs 2.   If she wants the breo / incruse then I can do that too.   Is advair gsk? Had no idea  Aaron Edelman  ----- Message ----- From: Rudean Haskell, [facility] Sent: 08/03/2018   3:56 PM EDT To: Adaline Sill, CPhT, Lauraine Rinne, NP  Sure, we can do Trelegy since this is [place] too.  I initially was trying to do as straight a conversion as possible with Wixela / Advair being same ingredients.  Going down to one inhaler is certainly easier for patient.  Do you want me to call patient to confirm this is ok with her first?   ----- Message ----- From: Lauraine Rinne, NP Sent: 08/03/2018   3:22 PM EDT To: Rudean Haskell, RPH  Can we not switch to:   Breo Ellipta 200 Incruse Ellipta   Or   Trelegy Ellipta   Thoughts?   Aaron Edelman  ----- Message ----- From: Rudean Haskell, [facility] Sent: 08/03/2018  10:58 AM EDT To: Lauraine Rinne,  NP  [place] Scarlet Aaron Edelman,  I was able to speak with Ms. Horsman on the phone today.  Based on her reported income, she will qualify for Daliresp patient assistance program so we'll get the paperwork started for this.  I could not find a program for [facility] and she does not qualify for Spiriva based on income.  She could however qualify for GSK products if you wanted to substitute Wixela --> Advair, Spiriva --> Incruse, and also add in Proventil.  She said she could probably afford the [place] for now but not sure how soon she'll go into the coverage gap.  Just let me know your thoughts on the substitutions.      Thanks! Ralene Bathe, PharmD, [place] [phone]

## 2018-08-11 ENCOUNTER — Other Ambulatory Visit: Payer: Self-pay | Admitting: Pharmacist

## 2018-08-11 NOTE — Patient Outreach (Signed)
[facility]) Care Management  [place] 08/11/2018  Priscilla Cantrell 1935-09-08 [id number]  Incoming call and voicemail received from patient.  Successful return call.  Patient states she has additional income in the form of dividends that she forgot to disclose earlier and wants to know if she is still eligible.  Dividends are income from 2019 and she does not know amount for 2020 as this is variable.  Based on yield from 2019, patient still qualifies for Daliresp but not Trelegy / Ventolin.  However, as this is not current income for 2020, I advised patient to continue to submit applications with current income statements (SSI + spouse pension).  If companies request 2019 income we can also submit with supporting statement as this is not reflective of current year.  Patient voiced understanding.  No further questions at this time.   Plan: Await return of applications from patient and provider.  [facility] will submit applications to patient assistance programs once received.   Ralene Bathe, PharmD, [place] [phone]

## 2018-08-12 DIAGNOSIS — H16223 Keratoconjunctivitis sicca, not specified as Sjogren's, bilateral: Secondary | ICD-10-CM | POA: Diagnosis not present

## 2018-08-12 DIAGNOSIS — H1045 Other chronic allergic conjunctivitis: Secondary | ICD-10-CM | POA: Diagnosis not present

## 2018-08-12 DIAGNOSIS — H04123 Dry eye syndrome of bilateral lacrimal glands: Secondary | ICD-10-CM | POA: Diagnosis not present

## 2018-08-18 ENCOUNTER — Telehealth: Payer: Self-pay | Admitting: Family Medicine

## 2018-08-18 DIAGNOSIS — R200 Anesthesia of skin: Secondary | ICD-10-CM

## 2018-08-18 DIAGNOSIS — R202 Paresthesia of skin: Secondary | ICD-10-CM

## 2018-08-18 NOTE — Telephone Encounter (Signed)
Patient is having numbness in her feet but not her entire foot. The stockings she has does not help. I have made an appointment for the patient to come in Friday 08/20/18 however patient said  if Dr. Anitra Lauth thinks she should see a neurologist she is agreeable with him setting up a referral and cancelling her appointment at our office since Dr. Anitra Lauth mentioned in prior OV that neuropathy can cause her symptoms.

## 2018-08-18 NOTE — Telephone Encounter (Signed)
Please advise, thanks.

## 2018-08-18 NOTE — Telephone Encounter (Signed)
Can cancel appt with me. I have ordered referral to neurologist.-thx

## 2018-08-18 NOTE — Telephone Encounter (Signed)
Appointment has been cancelled. Pt notified

## 2018-08-19 DIAGNOSIS — Z1231 Encounter for screening mammogram for malignant neoplasm of breast: Secondary | ICD-10-CM | POA: Diagnosis not present

## 2018-08-19 LAB — HM MAMMOGRAPHY

## 2018-08-20 ENCOUNTER — Ambulatory Visit: Payer: Medicare Other | Admitting: Family Medicine

## 2018-08-20 ENCOUNTER — Other Ambulatory Visit: Payer: Self-pay

## 2018-08-20 NOTE — Patient Outreach (Signed)
[facility]) Care Management  08/20/2018   Priscilla Cantrell 1935-06-22 [id number]      Outreach attempt # 1 to the patient for initial assessment.  HIPAA verified.  The patient was able to complete the assessment.  Social: The patient lives alone. She states her daughter and grandson are very supportive of her.  She is independent with her ADLS/IADLS.  She denies any pain or falls.  She drives herself to her appointments. The equipment in the home include: oxygen, nebulizer,pulse ox and life alert.  Conditions: Per chart review and talking with the patient her condition consist of: chronic venous insufficiency, HTN, COPD GOLD III B, Hypothyroidism, Osteoarthritis,  and spinal stenosis.  The patient states that she lives in the home with her dog.  She denies any COPD symptoms at this time.  [name] and Warner Mccreedy manage her COPD.  The patient states if she begins to be short of breath she will rest and do breathing exercise but she always has her rescue in haler with her.  The only irritant that she is around is dust when  she cleans the home.  She uses her oxygen at night on 2 litters. She states the only exercises she does is around the home. She eats a regular diet but monitors her food.  The patient is very independent and wants to stay that way.  Medications:  The patient is on about fifteen medications.  She manages her own meds.  She is currently working with [place] for medication assistance for Trelegy-Ellipta inhaler.  Appointments:  Last [facility] visit with Dr Anitra Lauth was on 06/16/18 The patient has an appointment to see  Neurologist on 7/31.  Advanced Directives: The patient states that she has a health care power of attorney and a living will.     Current Medications:  Current Outpatient Medications  Medication Sig Dispense Refill  . albuterol (PROAIR HFA) 108 (90 Base) MCG/ACT inhaler Inhale 2 puffs into the lungs every 4 (four) hours as needed for wheezing or  shortness of breath. 8.5 g 1  . albuterol (PROVENTIL) (2.5 MG/3ML) 0.083% nebulizer solution INHALE 1 VIAL VIA NEBULIZER EVERY 6 HOURS AS NEEDED FOR WHEEZING OR SHORTNESS OF BREATH 75 mL 1  . amLODipine (NORVASC) 5 MG tablet Take 5 mg by mouth daily.    . Artificial Tear Solution (SOOTHE XP) SOLN Place 1 drop into both eyes 2 (two) times daily as needed (irritation).    Marland Kitchen aspirin EC 81 MG tablet Take 81 mg by mouth daily.    Marland Kitchen azelastine (ASTELIN) 0.1 % nasal spray Place 2 sprays into both nostrils 2 (two) times daily. Use in each nostril as directed (Patient taking differently: Place 2 sprays into both nostrils 2 (two) times daily as needed for rhinitis or allergies. Use in each nostril as directed) 30 mL 3  . diazepam (VALIUM) 5 MG tablet Take 1 tablet (5 mg total) by mouth at bedtime. May take an additional 5 mg dose as needed for anxiety 60 tablet 5  . diphenhydrAMINE (BENADRYL) 25 MG tablet Take 25 mg by mouth 3 (three) times daily as needed for allergies.    . fluorometholone (FML) 0.1 % ophthalmic suspension Place 1 drop into both eyes as directed. Tapering off    . fluticasone (FLONASE) 50 MCG/ACT nasal spray Place 1 spray into both nostrils daily. (Patient taking differently: Place 1 spray into both nostrils as needed. ) 16 g 2  . montelukast (SINGULAIR) 10 MG tablet Take  10 mg by mouth at bedtime.     . Probiotic Product (PROBIOTIC DAILY PO) Take 1 capsule by mouth daily.     . roflumilast (DALIRESP) 500 MCG TABS tablet Take 1 tablet (500 mcg total) by mouth daily. 30 tablet 11  . SPIRIVA HANDIHALER 18 MCG inhalation capsule INHALE 1 CAPSULE VIA HANDIHALER EVERY DAY AT THE SAME TIME 30 capsule 2  . SYNTHROID 88 MCG tablet TAKE 1 TABLET BY MOUTH DAILY 30 tablet 5  . triamcinolone cream (KENALOG) 0.1 % Apply 1 application topically 2 (two) times daily. 30 g 0  . venlafaxine (EFFEXOR) 50 MG tablet TAKE 1 TABLET BY MOUTH  TWICE DAILY 180 tablet 0  . WIXELA INHUB 250-50 MCG/DOSE AEPB INHALE 1  PUFF INTO THE LUNGS TWICE DAILY 60 each 1  . Fluticasone-Umeclidin-Vilant (TRELEGY ELLIPTA) 100-62.5-25 MCG/INH AEPB Inhale 1 puff into the lungs daily. (Patient not taking: Reported on 08/20/2018) 1 each 11   No current facility-administered medications for this visit.     Functional Status:  In your present state of health, do you have any difficulty performing the following activities: 08/20/2018 12/08/2017  Hearing? N N  Vision? N N  Difficulty concentrating or making decisions? N N  Walking or climbing stairs? N Y  Dressing or bathing? N N  Doing errands, shopping? N N  Some recent data might be hidden    Fall/Depression Screening: Fall Risk  08/20/2018 09/04/2017 10/13/2016  Falls in the past year? 0 No Yes  Number falls in past yr: - - 1  Injury with Fall? - - No  Comment - - -  Risk for fall due to : - - Impaired balance/gait  Follow up - - Falls prevention discussed   PHQ 2/9 Scores 08/20/2018 08/02/2018 08/02/2018 09/04/2017 11/18/2016 10/13/2016 10/13/2016  PHQ - 2 Score 0 0 0 0 0 0 0  PHQ- 9 Score - - - 0 0 1 -    Assessment: Patient will benefit from health coach outreach for disease management and support.  THN CM Care Plan Problem One     Most Recent Value  Care Plan Problem One  Knowledge deficit related to diease management.  Role Documenting the Problem One  [place] for Problem One  Active  THN Long Term Goal   In 90 days the patient will verbalize no admission to the hospital for COPD exacerbation  THN Long Term Goal Start Date  08/20/18  Interventions for Problem One Long Term Goal  Reviewed signs and symptoms of COPD, discussed the action plan and medication adherence, Advised patient to avoid people who are sick, social distancing and washing hands with soap and water or using hand sanitizer.      Plan: RN Health Coach will provide ongoing education for patient on copd through phone calls and sending printed information to patient for further  discussion.  RN Health Coach will send Booklet on copd.  RN Health Coach will send initial barriers letter, assessment, and care plan to primary care physician.  [place] will send Consent form.  RN Health Coach will contact patient in the month of August and patient agrees to next outreach.    Lazaro Arms RN, BSN, [place] Direct Dial:  [phone]  Fax: [phone]

## 2018-08-23 ENCOUNTER — Other Ambulatory Visit: Payer: Self-pay | Admitting: Family Medicine

## 2018-08-23 NOTE — Telephone Encounter (Signed)
RF request for diazepam.  Last OV 06/16/2018 No upcoming appt Last RX 02/01/2018 # 60 X 5 rfs.  Please advise.

## 2018-08-24 ENCOUNTER — Other Ambulatory Visit: Payer: Self-pay | Admitting: Pharmacy Technician

## 2018-08-24 ENCOUNTER — Encounter: Payer: Self-pay | Admitting: Family Medicine

## 2018-08-24 NOTE — Patient Outreach (Signed)
[facility]) Care Management  08/24/2018  Priscilla Cantrell [birth year] [id number]    Successful call placed to patient regarding patient assistance application(s) for Trelegy, Ventolin and Daliresp , HIPAA identifiers verified. [name] states that she mailed out application last Wednesday or Thursday.  Follow up:  Will submit application to companies once documents have been received.  Maud Deed Chana Bode [place] Certified Pharmacy Technician [place] Management Direct Dial:[phone]

## 2018-08-25 ENCOUNTER — Other Ambulatory Visit: Payer: Self-pay | Admitting: Pharmacy Technician

## 2018-08-25 NOTE — Patient Outreach (Signed)
[facility]) Care Management  08/25/2018  Priscilla Cantrell 01-19-1936 [id number]   Received patient portion(s) of patient assistance application for Trelegy, Ventolin HFA and Daliresp.   Sent in-basket message to L. Reynolds @ [facility] requesting missing script for Ventolin HFA.  Successful call to patient, HIPAA identifiers verified, informing patient that I received applications and printout from pharmacy. Infromed her that she did not include her proof of income or copy of her insurance card. She states she will make copies of items and mail them back into.  Will submit applications to companies once all documents have been received.  Maud Deed Chana Bode [place] Certified Pharmacy Technician [place] Management Direct Dial:[phone]

## 2018-08-26 ENCOUNTER — Other Ambulatory Visit: Payer: Self-pay | Admitting: Pharmacist

## 2018-08-26 NOTE — Patient Outreach (Signed)
[place] [facility]) Care Management  [place] 08/26/2018  Priscilla Cantrell January 30, 1936 [id number]  Incoming call and voicemail from [name].   -She has questions about application documents that are needed for her applications.  -We reviewed that she still needs to send in copy of most recent insurance card and most recent proof of income  -Patient has tax information from 2019 and is waiting on 2020 documents which should arrive within 1 week.  -Patient will have daughter fax or email documents to [place]: Will update [facility] pharmacy technician re: possible incoming documents from patient  Ralene Bathe, PharmD, [place] [phone]

## 2018-08-31 ENCOUNTER — Telehealth: Payer: Self-pay | Admitting: Family Medicine

## 2018-08-31 NOTE — Telephone Encounter (Signed)
Pt has been scheduled with Dr.Kuneff tomorrow.

## 2018-08-31 NOTE — Telephone Encounter (Signed)
Patient is experiencing something is coming out of her vagina. She can push it back in however it doesn't stay. She is experiencing vaginal discharge. Patient does not have a GYN physician. Patient is requesting to make an in office visit with our female provider.

## 2018-09-01 ENCOUNTER — Encounter: Payer: Self-pay | Admitting: Neurology

## 2018-09-01 ENCOUNTER — Telehealth: Payer: Self-pay | Admitting: Pulmonary Disease

## 2018-09-01 ENCOUNTER — Other Ambulatory Visit (HOSPITAL_COMMUNITY)
Admission: RE | Admit: 2018-09-01 | Discharge: 2018-09-01 | Disposition: A | Discharge status: Home / Self Care | Payer: Medicare Other | Source: Ambulatory Visit | Attending: Family Medicine | Admitting: Family Medicine

## 2018-09-01 ENCOUNTER — Other Ambulatory Visit: Payer: Self-pay | Admitting: Pulmonary Disease

## 2018-09-01 ENCOUNTER — Other Ambulatory Visit: Payer: Self-pay

## 2018-09-01 ENCOUNTER — Ambulatory Visit (INDEPENDENT_AMBULATORY_CARE_PROVIDER_SITE_OTHER): Payer: Medicare Other | Admitting: Family Medicine

## 2018-09-01 ENCOUNTER — Encounter: Payer: Self-pay | Admitting: Family Medicine

## 2018-09-01 VITALS — BP 126/83 | HR 71 | Temp 97.9°F | Resp 18 | Ht 60.0 in | Wt 135.2 lb

## 2018-09-01 DIAGNOSIS — N8110 Cystocele, unspecified: Secondary | ICD-10-CM | POA: Diagnosis not present

## 2018-09-01 DIAGNOSIS — N898 Other specified noninflammatory disorders of vagina: Secondary | ICD-10-CM | POA: Diagnosis not present

## 2018-09-01 DIAGNOSIS — R8290 Unspecified abnormal findings in urine: Secondary | ICD-10-CM

## 2018-09-01 LAB — POCT URINALYSIS DIPSTICK
Bilirubin, UA: NEGATIVE
Blood, UA: 10
Glucose, UA: NEGATIVE
Ketones, UA: NEGATIVE
Nitrite, UA: NEGATIVE
Protein, UA: NEGATIVE
Spec Grav, UA: 1.025 (ref 1.010–1.025)
Urobilinogen, UA: 0.2 E.U./dL
pH, UA: 6 (ref 5.0–8.0)

## 2018-09-01 MED ORDER — FLUCONAZOLE 150 MG PO TABS: 150.0000 mg | ORAL_TABLET | Freq: Once | ORAL | 0 refills | Status: AC

## 2018-09-01 MED ORDER — FLUTICASONE-SALMETEROL 250-50 MCG/DOSE IN AEPB: INHALATION_SPRAY | RESPIRATORY_TRACT | 5 refills | Status: DC

## 2018-09-01 MED ORDER — ALBUTEROL SULFATE HFA 108 (90 BASE) MCG/ACT IN AERS: 2.0000 | INHALATION_SPRAY | Freq: Four times a day (QID) | RESPIRATORY_TRACT | 3 refills | Status: DC | PRN

## 2018-09-01 NOTE — Telephone Encounter (Signed)
Refill of pt's wixela inhaler was sent to preferred pharmacy. Called and spoke with pt letting her know this had been done and she verbalized understanding. Nothing further needed.

## 2018-09-01 NOTE — Progress Notes (Signed)
LEEAN Cantrell , [birth year], 83 y.o., female MRN: [id number] Patient Care Team    Relationship Specialty Notifications Start End  [name], MD PCP - General   01/16/10    Comment: Candida Peeling, Denice Bors, MD Consulting Physician Cardiology  06/06/10   Monna Fam, MD Consulting Physician Ophthalmology  01/29/11   Chesley Mires, MD Consulting Physician Pulmonary Disease  12/21/12   Latanya Maudlin, MD Consulting Physician Orthopedic Surgery  09/26/14   McKenzie, [name], MD Consulting Physician Urology  03/10/16   Richmond Campbell, MD Consulting Physician Gastroenterology  10/28/16   Paralee Cancel, MD Consulting Physician Orthopedic Surgery  09/04/17   Linus Mako, MD Consulting Physician Family Medicine  03/03/18   Lauraine Rinne, NP Nurse Practitioner Pulmonary Disease  08/02/18   Lazaro Arms, [facility]  Admissions 08/02/18   Rudean Haskell, [facility] Pharmacist  08/03/18   Coleman, Ashley N, [name] Pharmacy Technician  08/04/18     Chief Complaint  Patient presents with  . Vaginal Discharge    Pt has vaginal prolapse and this morning she noticed white discharge on her pad. Denies itching, blood, or fever.      Subjective: Pt presents for an OV with complaints of vaginal discharge that started yesterday.  She reports she wears a panty liner secondary to her history of occasional vaginal prolapse.  The vaginal discharge was thin and white.  She denies any vaginal irritation or lesions.  She denies dysuria.  She denies any fever, chills or abdominal pain.  She reports there are times when she will feel something prolapse through her vaginal opening and she will use her finger and "push it back up.  "She has a history of hysterectomy.    Depression screen [facility] 2/9 08/20/2018 08/02/2018 08/02/2018 09/04/2017 11/18/2016  Decreased Interest 0 0 0 0 0  Down, Depressed, Hopeless  0 0 0 0 0  PHQ - 2 Score 0 0 0 0 0  Altered sleeping - - - 0 0  Tired, decreased energy - - - 0 0  Change in appetite - - - 0 0  Feeling bad or failure about yourself  - - - 0 0  Trouble concentrating - - - 0 0  Moving slowly or fidgety/restless - - - 0 0  Suicidal thoughts - - - 0 0  PHQ-9 Score - - - 0 0  Difficult doing work/chores - - - Not difficult at all -  Some recent data might be hidden    Allergies  Allergen Reactions  . Losartan Other (See Comments)    hyperkalemia  . Penicillins Hives, Swelling and Rash    Has patient had a PCN reaction causing immediate rash, facial/tongue/throat swelling, SOB or lightheadedness with hypotension: YES Has patient had a PCN reaction causing severe rash involving mucus membranes or skin necrosis: NO Has patient had a PCN reaction that required hospitalization NO Has patient had a PCN reaction occurring within the last 10 years: NO If all of the above answers are "NO", then may proceed with Cephalosporin use.   . Cefixime [Kdc:Cefixime] Other (See Comments)    unspecified  . Indocin [Indomethacin] Other (See Comments)    Painful tongue and throat  . Singulair [Montelukast Sodium]     Chest tightness   . Sulfa Antibiotics Other (See Comments)    unspecified  . Ace Inhibitors Other (See Comments)  cough  . Statins Other (See Comments)    Myalgias    Social History   Social History Narrative   Widowed since fall 2014   Lives in [place], [place].      Worked as Research officer, trade union, retired 2017.   She has support of her daughter and grandson. She is independent with all care needs and transportation at this time. 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    She reports she is on SSI and receives another separate check    She reports her work hx included working in Engineer, mining for [employer] and volunteered in a [employer]   She reports  having a short hair dog for 2 years  but denies noting any allergic reactions   Right handed   Two story home   Past Medical History:  Diagnosis Date  . Allergic rhinitis    with upper airway cough: as of 07/2017 pulm f/u she was instructed to use astelin, flonase, and saline more consistently  . BPPV (benign paroxysmal positional vertigo) 03/2018  . Chest pain, non-cardiac    Cardiac CT showed no signif obstructive dz, mildly elevated calcium level (Dr. [name]).  . Chronic cystitis with hematuria    [name]  . Chronic hypoxemic respiratory failure ([facility]) 02/02/2015   2L oxygen 24/7  . COPD (chronic obstructive pulmonary disease) (HCC)    GOLD 4.  spirometry 01/10/09 FEV 0.97(52%), FEV1% 47.  With chronic bronchitis as of 07/2017.  . DDD (degenerative disc disease), cervical    1998 MRI C5-6 impingemt (left)  . DDD (degenerative disc disease), lumbar   . Depression with anxiety 04/15/2011  . Diarrheal disease Summer 2017   ? 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).  As of 10/05/15 pt reports GI dx'd her with C diff and rx'd more flagyl and she is also on cholestyramine.  As of 02/2016, pt's sx's resolved completely.  . Diverticulosis of colon    Procto '97 and colonoscopy 2002  . Dysplastic nevus of upper extremity 04/2014   R tricep ([name])  . Edema of both lower extremities due to peripheral venous insufficiency    Varicosities bilat, swelling L>R.  R baker's cyst.  Got vein clinic eval 02/2018->sclerotherapy recommended but since no hemorrhage or ulceration, insurance will not cover the procedure.  . Family history of adverse reaction to anesthesia    mother died when patient age 61 receiving anesthesia for thyroid surgery  . Fibrocystic breast disease    w/fibroadenoma.  Bx's showed NO atypia (Dr. Margot Chimes).  Mammo neg 2008.  Marland Kitchen GERD (gastroesophageal reflux disease)    . History of double vision    Ophthalmologist, Dr. Herbert Deaner, is further evaluating this with MRI  orbits and limited brain (myesthenia gravis testing neg 07/2010)  . History of home oxygen therapy    uses 2 liters at hs  . Hypertension    EKG 03/2010 normal  . Hypothyroidism    Hashimoto's, dx'd 1989  . Lesion of right native kidney 2017   found on CT 02/2015.  F/u MRI 03/31/15 showed it to be smaller and less likely of any significance but repeat CT renal protocol in 22mo was recommended by radiology.  This was done 10/26/15 and showed no interval change in the lesion, but b/c the lesion enhances with contrast, radiol rec'd  urol consult (b/c pap renal RCC could not be excluded).  Saw urol 03/06/16--stable on u/s 09/2016 and 09/2017.  . Numbness in feet 04/2018   S1 distribution bilat, c/w spinal stenosis sensory neuropathy (no pain).  . Osteoarthritis, hip, bilateral    she is s/p bilat THA as of 02/2018.  Marland Kitchen Pneumonia   . Postmenopausal atrophic vaginitis    improved with estrace 2 X/week.  . Pulmonary nodule    CT chest 12/10, June 2011.  No nodule on CT 06/2010.  Marland Kitchen Recurrent Clostridium difficile diarrhea    Episode 09/19/2016: resolved with prolonged course of flagyl.  11/2016 recurrence treated with 10d of Dificid (Fidaxomicin) by GI (Dr. Earlean Shawl).  . Recurrent UTI    likely related to pt's atrophic vaginitis.  Urol started vaginal estrace 03/2016.  Marland Kitchen Shingles 02/2014   R abd/flank  . Spinal stenosis of lumbar region with neurogenic claudication    2008 MRI (L3-4, L4-5, L5-S1).  +S1 distribution sensory loss bilat   Past Surgical History:  Procedure Laterality Date  . ABDOMINAL HYSTERECTOMY  1978   no BSO per pt--nonmalignant reasons  . APPENDECTOMY    . BREAST CYST EXCISION     Last screening mammogram 10/2010 was normal.  . CHOLECYSTECTOMY  2001  . COLONOSCOPY  04/2005; 12/06/2015   2007 NORMAL.  2017 adenomatous polyp x 1.  Mod sigmoid diverticulosis (Dr. Earlean Shawl).    Marland Kitchen DEXA  05/18/2017    NORMAL (T-score 0.1)  . HIP CLOSED REDUCTION Right 12/25/2015   Procedure: CLOSED REDUCTION HIP;  Surgeon: Nicholes Stairs, MD;  Location: [facility];  Service: Orthopedics;  Laterality: Right;  . TONSILLECTOMY    . TOTAL HIP ARTHROPLASTY Right 11/01/2014   Procedure: RIGHT TOTAL HIP ARTHROPLASTY;  Surgeon: Latanya Maudlin, MD;  Location: [facility];  Service: Orthopedics;  Laterality: Right;  . TOTAL HIP ARTHROPLASTY Left 12/08/2017   Procedure: LEFT TOTAL HIP ARTHROPLASTY ANTERIOR APPROACH;  Surgeon: Paralee Cancel, MD;  Location: [facility];  Service: Orthopedics;  Laterality: Left;  70 mins  . TRANSTHORACIC ECHOCARDIOGRAM  08/2014   EF 60-65%, grade I diast dysfxn  . TUBAL LIGATION  1974   Family History  Problem Relation Age of Onset  . Goiter Mother   . Psoriasis Father        od liver  . Cirrhosis Father   . Diabetes Daughter        Type 2  . Stroke Maternal Grandfather   . Stroke Paternal Grandmother   . Hypertension Paternal Grandmother   . Hernia Paternal Grandmother   . Cancer Paternal Grandfather        lung  . Cancer Maternal Aunt        cancer of the stomach   Allergies as of 09/01/2018      Reactions   Losartan Other (See Comments)   hyperkalemia   Penicillins Hives, Swelling, Rash   Has patient had a PCN reaction causing immediate rash, facial/tongue/throat swelling, SOB or lightheadedness with hypotension: YES Has patient had a PCN reaction causing severe rash involving mucus membranes or skin necrosis: NO Has patient had a PCN reaction that required hospitalization NO Has patient had a PCN reaction occurring within the last 10 years: NO If all of the above answers are "NO", then may proceed with Cephalosporin use.   Cefixime [kdc:cefixime] Other (See Comments)   unspecified   Indocin [indomethacin] Other (See Comments)   Painful tongue and throat   Singulair [montelukast Sodium]    Chest tightness  Sulfa Antibiotics Other (See Comments)   unspecified   Ace  Inhibitors Other (See Comments)   cough   Statins Other (See Comments)   Myalgias      Medication List       Accurate as of September 01, 2018  1:52 PM. If you have any questions, ask your nurse or doctor.        albuterol (2.5 MG/3ML) 0.083% nebulizer solution Commonly known as: PROVENTIL INHALE 1 VIAL VIA NEBULIZER EVERY 6 HOURS AS NEEDED FOR WHEEZING OR SHORTNESS OF BREATH What changed: Another medication with the same name was added. Make sure you understand how and when to take each. Changed by: Lauraine Rinne, NP   albuterol 108 (90 Base) MCG/ACT inhaler Commonly known as: ProAir HFA Inhale 2 puffs into the lungs every 4 (four) hours as needed for wheezing or shortness of breath. What changed: Another medication with the same name was added. Make sure you understand how and when to take each. Changed by: Lauraine Rinne, NP   albuterol 108 (90 Base) MCG/ACT inhaler Commonly known as: VENTOLIN HFA Inhale 2 puffs into the lungs every 6 (six) hours as needed for wheezing or shortness of breath. What changed: You were already taking a medication with the same name, and this prescription was added. Make sure you understand how and when to take each. Changed by: Lauraine Rinne, NP   amLODipine 5 MG tablet Commonly known as: NORVASC Take 5 mg by mouth daily.   aspirin EC 81 MG tablet Take 81 mg by mouth daily.   azelastine 0.1 % nasal spray Commonly known as: ASTELIN Place 2 sprays into both nostrils 2 (two) times daily. Use in each nostril as directed What changed:   when to take this  reasons to take this   diazepam 5 MG tablet Commonly known as: VALIUM TAKE 1 TABLET BY MOUTH EVERY NIGHT AT BEDTIME AND MAY TAKE AN ADDITIONAL TABLET AS NEEDED FOR ANXIETY   diphenhydrAMINE 25 MG tablet Commonly known as: BENADRYL Take 25 mg by mouth 3 (three) times daily as needed for allergies.   fluconazole 150 MG tablet Commonly known as: DIFLUCAN Take 1 tablet (150 mg total) by mouth  once for 1 dose. Started by: Howard Pouch, DO   fluorometholone 0.1 % ophthalmic suspension Commonly known as: FML Place 1 drop into both eyes as directed. Tapering off   fluticasone 50 MCG/ACT nasal spray Commonly known as: FLONASE Place 1 spray into both nostrils daily. What changed:   when to take this  reasons to take this   Fluticasone-Salmeterol 250-50 MCG/DOSE Aepb Commonly known as: Wixela Inhub INHALE 1 PUFF INTO THE LUNGS TWICE DAILY   montelukast 10 MG tablet Commonly known as: SINGULAIR Take 10 mg by mouth at bedtime.   PROBIOTIC DAILY PO Take 1 capsule by mouth daily.   roflumilast 500 MCG Tabs tablet Commonly known as: DALIRESP Take 1 tablet (500 mcg total) by mouth daily.   Soothe XP Soln Place 1 drop into both eyes 2 (two) times daily as needed (irritation).   Spiriva HandiHaler 18 MCG inhalation capsule Generic drug: tiotropium INHALE 1 CAPSULE VIA HANDIHALER EVERY DAY AT THE SAME TIME   Spiriva HandiHaler 18 MCG inhalation capsule Generic drug: tiotropium   Synthroid 88 MCG tablet Generic drug: levothyroxine TAKE 1 TABLET BY MOUTH DAILY   Trelegy Ellipta 100-62.5-25 MCG/INH Aepb Generic drug: Fluticasone-Umeclidin-Vilant Inhale 1 puff into the lungs daily.   triamcinolone cream 0.1 % Commonly known as:  KENALOG Apply 1 application topically 2 (two) times daily.   venlafaxine 50 MG tablet Commonly known as: EFFEXOR   venlafaxine 50 MG tablet Commonly known as: EFFEXOR TAKE 1 TABLET BY MOUTH  TWICE DAILY       All past medical history, surgical history, allergies, family history, immunizations andmedications were updated in the EMR today and reviewed under the history and medication portions of their EMR.     ROS: Negative, with the exception of above mentioned in HPI   Objective:  BP 126/83 (BP Location: Right Arm, Patient Position: Sitting, Cuff Size: Normal)   Pulse 71   Temp 97.9 F (36.6 C) (Temporal)   Resp 18   Ht 5'  (1.524 m)   Wt 135 lb 4 oz (61.3 kg)   SpO2 96%   BMI 26.41 kg/m  Body mass index is 26.41 kg/m. Gen: Afebrile. No acute distress. Nontoxic in appearance, well developed, well nourished.  HENT: AT. . MMM Eyes:Pupils Equal Round Reactive to light, Extraocular movements intact,  Conjunctiva without redness, discharge or icterus. Abd: Soft. NTND. BS present.  No masses palpated. Neuro:Normal gait. PERLA. EOMi. Alert. Oriented x3  GYN:  External genitalia within normal limits, normal hair distribution, no lesions. Urethral meatus normal, no lesions. No bladder/suprapubic fullness, masses or tenderness.  Minimal discharge noted  on vaginal swab.  Bimanual exam completed only, no speculum exam completed today.  Very minimal cystocele with patient bearing down.-However her bladder was completely empty.  Anus and perineum within normal limits, no lesions.  No exam data present No results found. Results for orders placed or performed in visit on 09/01/18 (from the past 24 hour(s))  POCT urinalysis dipstick     Status: Abnormal   Collection Time: 09/01/18 11:47 AM  Result Value Ref Range   Color, UA yellow    Clarity, UA clear    Glucose, UA Negative Negative   Bilirubin, UA negative    Ketones, UA negative    Spec Grav, UA 1.025 1.010 - 1.025   Blood, UA 10    pH, UA 6.0 5.0 - 8.0   Protein, UA Negative Negative   Urobilinogen, UA 0.2 0.2 or 1.0 E.U./dL   Nitrite, UA negative    Leukocytes, UA Moderate (2+) (A) Negative   Appearance     Odor      Assessment/Plan: Priscilla Cantrell is a 83 y.o. female present for OV for  Vaginal discharge/Female bladder prolapse -Was unable to appreciate much in the way of vaginal prolapse today in the office with patient bearing down and manual exam.  She did however just empty her bladder.  Her history certainly sounds like she is experiencing bladder prolapse possibly when her bladder is full.  Discussed options with her today and she would like  referral to a gynecologist to have a formal exam and discuss options.  Briefly, discussed pessaries today has a potential option if she is a candidate and is found to have the bladder prolapse. - For her mild vaginal discharge >>>wet prep sent for cytology today.  Diflucan x1 prescribed. - POCT urinalysis dipstick - Cervicovaginal ancillary only( [facility]) - Ambulatory referral to Gynecology  Abnormal urine - tx only if culture + - Urine Culture     Reviewed expectations re: course of current medical issues.  Discussed self-management of symptoms.  Outlined signs and symptoms indicating need for more acute intervention.  Patient verbalized understanding and all questions were answered.  Patient received an After-Visit Summary.  Orders Placed This Encounter  Procedures  . Urine Culture  . Ambulatory referral to Gynecology  . POCT urinalysis dipstick    > 25 minutes spent with patient, >50% of time spent face to face    Note is dictated utilizing voice recognition software. Although note has been proof read prior to signing, occasional typographical errors still can be missed. If any questions arise, please do not hesitate to call for verification.   electronically signed by:  Howard Pouch, DO  [place]

## 2018-09-01 NOTE — Patient Instructions (Signed)
I called in the diflucan one pill for you.  I referred you to GYN and they will call you to schedule.  We will call you when the urine results return as well.   Pelvic Organ Prolapse Pelvic organ prolapse is the stretching, bulging, or dropping of pelvic organs into an abnormal position. It happens when the muscles and tissues that surround and support pelvic structures become weak or stretched. Pelvic organ prolapse can involve the:  Vagina (vaginal prolapse).  Uterus (uterine prolapse).  Bladder (cystocele).  Rectum (rectocele).  Intestines (enterocele). When organs other than the vagina are involved, they often bulge into the vagina or protrude from the vagina, depending on how severe the prolapse is. What are the causes? This condition may be caused by:  Pregnancy, labor, and childbirth.  Past pelvic surgery.  Decreased production of the hormone estrogen associated with menopause.  Consistently lifting more than 50 lb (23 kg).  Obesity.  Long-term inability to pass stool (chronic constipation).  A cough that lasts a long time (chronic).  Buildup of fluid in the abdomen due to certain diseases and other conditions. What are the signs or symptoms? Symptoms of this condition include:  Passing a little urine (loss of bladder control) when you cough, sneeze, strain, and exercise (stress incontinence). This may be worse immediately after childbirth. It may gradually improve over time.  Feeling pressure in your pelvis or vagina. This pressure may increase when you cough or when you are passing stool.  A bulge that protrudes from the opening of your vagina.  Difficulty passing urine or stool.  Pain in your lower back.  Pain, discomfort, or disinterest in sex.  Repeated bladder infections (urinary tract infections).  Difficulty inserting a tampon. In some people, this condition causes no symptoms. How is this diagnosed? This condition may be diagnosed based on a  vaginal and rectal exam. During the exam, you may be asked to cough and strain while you are lying down, sitting, and standing up. Your health care provider will determine if other tests are required, such as bladder function tests. How is this treated? Treatment for this condition may depend on your symptoms. Treatment may include:  Lifestyle changes, such as changes to your diet.  Emptying your bladder at scheduled times (bladder training therapy). This can help reduce or avoid urinary incontinence.  Estrogen. Estrogen may help mild prolapse by increasing the strength and tone of pelvic floor muscles.  Kegel exercises. These may help mild cases of prolapse by strengthening and tightening the muscles of the pelvic floor.  A soft, flexible device that helps support the vaginal walls and keep pelvic organs in place (pessary). This is inserted into your vagina by your health care provider.  Surgery. This is often the only form of treatment for severe prolapse. Follow these instructions at home:  Avoid drinking beverages that contain caffeine or alcohol.  Increase your intake of high-fiber foods. This can help decrease constipation and straining during bowel movements.  Lose weight if recommended by your health care provider.  Wear a sanitary pad or adult diapers if you have urinary incontinence.  Avoid heavy lifting and straining with exercise and work. Do not hold your breath when you perform mild to moderate lifting and exercise activities. Limit your activities as directed by your health care provider.  Do Kegel exercises as directed by your health care provider. To do this: ? Squeeze your pelvic floor muscles tight. You should feel a tight lift in your rectal area  and a tightness in your vaginal area. Keep your stomach, buttocks, and legs relaxed. ? Hold the muscles tight for up to 10 seconds. ? Relax your muscles. ? Repeat this exercise 50 times a day, or as many times as told by  your health care provider. Continue to do this exercise for at least 4-6 weeks, or for as long as told by your health care provider.  Take over-the-counter and prescription medicines only as told by your health care provider.  If you have a pessary, take care of it as told by your health care provider.  Keep all follow-up visits as told by your health care provider. This is important. Contact a health care provider if you:  Have symptoms that interfere with your daily activities or sex life.  Need medicine to help with the discomfort.  Notice bleeding from your vagina that is not related to your period.  Have a fever.  Have pain or bleeding when you urinate.  Have bleeding when you pass stool.  Pass urine when you have sex.  Have chronic constipation.  Have a pessary that falls out.  Have bad smelling vaginal discharge.  Have an unusual, low pain in your abdomen. Summary  Pelvic organ prolapse is the stretching, bulging, or dropping of pelvic organs into an abnormal position. It happens when the muscles and tissues that surround and support pelvic structures become weak or stretched.  When organs other than the vagina are involved, they often bulge into the vagina or protrude from the vagina, depending on how severe the prolapse is.  In most cases, this condition needs to be treated only if it produces symptoms. Treatment may include lifestyle changes, estrogen, Kegel exercises, pessary insertion, or surgery.  Avoid heavy lifting and straining with exercise and work. Do not hold your breath when you perform mild to moderate lifting and exercise activities. Limit your activities as directed by your health care provider. This information is not intended to replace advice given to you by your health care provider. Make sure you discuss any questions you have with your health care provider. Document Released: 08/17/2013 Document Revised: 02/11/2017 Document Reviewed: 02/11/2017  Elsevier Patient Education  2020 [employer].

## 2018-09-02 ENCOUNTER — Telehealth: Payer: Self-pay | Admitting: Family Medicine

## 2018-09-02 LAB — CERVICOVAGINAL ANCILLARY ONLY
Bacterial vaginitis: POSITIVE — AB
Candida vaginitis: NEGATIVE

## 2018-09-02 MED ORDER — METRONIDAZOLE 500 MG PO TABS: 500.0000 mg | ORAL_TABLET | Freq: Two times a day (BID) | ORAL | 0 refills | Status: DC

## 2018-09-02 NOTE — Telephone Encounter (Signed)
Please inform patient the following information: Her wet prep resulted with bacterial vaginosis,  this is treated with metronidazole twice daily x7 days. I called this in for her.

## 2018-09-02 NOTE — Telephone Encounter (Signed)
Pt was called and given information. Pt verbalized understanding

## 2018-09-03 ENCOUNTER — Other Ambulatory Visit: Payer: Self-pay

## 2018-09-03 ENCOUNTER — Telehealth (INDEPENDENT_AMBULATORY_CARE_PROVIDER_SITE_OTHER): Payer: Medicare Other | Admitting: Neurology

## 2018-09-03 VITALS — Ht 61.0 in | Wt 134.0 lb

## 2018-09-03 DIAGNOSIS — R202 Paresthesia of skin: Secondary | ICD-10-CM | POA: Diagnosis not present

## 2018-09-03 LAB — URINE CULTURE
MICRO NUMBER:: 717193
Result:: NO GROWTH
SPECIMEN QUALITY:: ADEQUATE

## 2018-09-03 NOTE — Progress Notes (Signed)
New Patient Virtual Visit via Video Note The purpose of this virtual visit is to provide medical care while limiting exposure to the novel coronavirus.    Consent was obtained for video visit:  Yes.   Answered questions that patient had about telehealth interaction:  Yes.   I discussed the limitations, risks, security and privacy concerns of performing an evaluation and management service by telemedicine. I also discussed with the patient that there may be a patient responsible charge related to this service. The patient expressed understanding and agreed to proceed.  Pt location: Home Physician Location: office Name of referring provider:  [name], MD I connected with Priscilla Cantrell at patients initiation/request on 09/03/2018 at  1:50 PM EDT by video enabled telemedicine application and verified that I am speaking with the correct person using two identifiers. Pt MRN:  [id number] Pt DOB:  13-Mar-1935 Video Participants:  Priscilla Cantrell    History of Present Illness: Priscilla Cantrell is a 83 y.o. right-handed female with COPD, GERD, and hypertension  presenting for evaluation of bilateral feel numbness. Starting around 2019, she began having abnormal sensation in the feet, as if she was walking on bubbles/balls.  She has numbness over the soles of the feet and intermittent tingling. She has also noticed difficulty with flexing her toes on both feet.  No difficulty extending her toes.  She walks unassisted and has not had any falls.  She has some imbalance on uneven ground.  Symptoms are constant.  There is nothing which alleviates or exacerbates her symptoms.   No history of diabetes, alcohol use, or family history of neuropathy.   Out-side paper records, electronic medical record, and images have been reviewed where available and summarized as:  Lab Results  Component Value Date   HGBA1C 5.7 10/29/2011   Lab Results  Component Value Date   TSH 1.01 05/26/2018   Lab  Results  Component Value Date   ESRSEDRATE 76 (H) 06/02/2014    Past Medical History:  Diagnosis Date  . Allergic rhinitis    with upper airway cough: as of 07/2017 pulm f/u she was instructed to use astelin, flonase, and saline more consistently  . BPPV (benign paroxysmal positional vertigo) 03/2018  . Chest pain, non-cardiac    Cardiac CT showed no signif obstructive dz, mildly elevated calcium level (Priscilla Cantrell).  . Chronic cystitis with hematuria    Priscilla Cantrell  . Chronic hypoxemic respiratory failure ([place]) 02/02/2015   2L oxygen 24/7  . COPD (chronic obstructive pulmonary disease) ([facility])    GOLD 4.  spirometry 01/10/09 FEV 0.97(52%), FEV1% 47.  With chronic bronchitis as of 07/2017.  . DDD (degenerative disc disease), cervical    1998 MRI C5-6 impingemt (left)  . DDD (degenerative disc disease), lumbar   . Depression with anxiety 04/15/2011  . Diarrheal disease Summer 2017   ? refractor C diff vs post infectious diarrhea predominant syndrome--GI told her to avoid lactose, sorbitol, and caffeine (08/30/15--Priscilla Cantrell).  As of 10/05/15 pt reports GI dx'd her with C diff and rx'd more flagyl and she is also on cholestyramine.  As of 02/2016, pt's sx's resolved completely.  . Diverticulosis of colon    Procto '97 and colonoscopy 2002  . Dysplastic nevus of upper extremity 04/2014   R tricep (Priscilla Cantrell)  . Edema of both lower extremities due to peripheral venous insufficiency    Varicosities bilat, swelling L>R.  R baker's cyst.  Got vein clinic eval 02/2018->sclerotherapy  recommended but since no hemorrhage or ulceration, insurance will not cover the procedure.  . Family history of adverse reaction to anesthesia    mother died when patient age 24 receiving anesthesia for thyroid surgery  . Fibrocystic breast disease    w/fibroadenoma.  Bx's showed NO atypia (Priscilla Cantrell).  Mammo neg 2008.  Marland Kitchen GERD (gastroesophageal reflux disease)   . History of double vision     Ophthalmologist, Priscilla Cantrell, is further evaluating this with MRI  orbits and limited brain (myesthenia gravis testing neg 07/2010)  . History of home oxygen therapy    uses 2 liters at hs  . Hypertension    EKG 03/2010 normal  . Hypothyroidism    Hashimoto's, dx'd 1989  . Lesion of right native kidney 2017   found on CT 02/2015.  F/u MRI 03/31/15 showed it to be smaller and less likely of any significance but repeat CT renal protocol in 22mo was recommended by radiology.  This was done 10/26/15 and showed no interval change in the lesion, but b/c the lesion enhances with contrast, radiol rec'd urol consult (b/c pap renal RCC could not be excluded).  Saw urol 03/06/16--stable on u/s 09/2016 and 09/2017.  . Numbness in feet 04/2018   S1 distribution bilat, c/w spinal stenosis sensory neuropathy (no pain).  . Osteoarthritis, hip, bilateral    she is s/p bilat THA as of 02/2018.  Marland Kitchen Pneumonia   . Postmenopausal atrophic vaginitis    improved with estrace 2 X/week.  . Pulmonary nodule    CT chest 12/10, June 2011.  No nodule on CT 06/2010.  Marland Kitchen Recurrent Clostridium difficile diarrhea    Episode 09/19/2016: resolved with prolonged course of flagyl.  11/2016 recurrence treated with 10d of Dificid (Fidaxomicin) by GI (Priscilla Cantrell).  . Recurrent UTI    likely related to pt's atrophic vaginitis.  Urol started vaginal estrace 03/2016.  Marland Kitchen Shingles 02/2014   R abd/flank  . Spinal stenosis of lumbar region with neurogenic claudication    2008 MRI (L3-4, L4-5, L5-S1).  +S1 distribution sensory loss bilat    Past Surgical History:  Procedure Laterality Date  . ABDOMINAL HYSTERECTOMY  1978   no BSO per pt--nonmalignant reasons  . APPENDECTOMY    . BREAST CYST EXCISION     Last screening mammogram 10/2010 was normal.  . CHOLECYSTECTOMY  2001  . COLONOSCOPY  04/2005; 12/06/2015   2007 NORMAL.  2017 adenomatous polyp x 1.  Mod sigmoid diverticulosis (Priscilla Cantrell).    Marland Kitchen DEXA  05/18/2017   NORMAL (T-score 0.1)  . HIP  CLOSED REDUCTION Right 12/25/2015   Procedure: CLOSED REDUCTION HIP;  Surgeon: Priscilla Stairs, MD;  Location: [facility];  Service: Orthopedics;  Laterality: Right;  . TONSILLECTOMY    . TOTAL HIP ARTHROPLASTY Right 11/01/2014   Procedure: RIGHT TOTAL HIP ARTHROPLASTY;  Surgeon: Latanya Maudlin, MD;  Location: [facility];  Service: Orthopedics;  Laterality: Right;  . TOTAL HIP ARTHROPLASTY Left 12/08/2017   Procedure: LEFT TOTAL HIP ARTHROPLASTY ANTERIOR APPROACH;  Surgeon: Paralee Cancel, MD;  Location: [facility];  Service: Orthopedics;  Laterality: Left;  70 mins  . TRANSTHORACIC ECHOCARDIOGRAM  08/2014   EF 60-65%, grade I diast dysfxn  . TUBAL LIGATION  1974     Medications:  Outpatient Encounter Medications as of 09/03/2018  Medication Sig Note  . albuterol (PROAIR HFA) 108 (90 Base) MCG/ACT inhaler Inhale 2 puffs into the lungs every 4 (four) hours as needed for wheezing or shortness of breath.   Marland Kitchen  albuterol (PROVENTIL) (2.5 MG/3ML) 0.083% nebulizer solution INHALE 1 VIAL VIA NEBULIZER EVERY 6 HOURS AS NEEDED FOR WHEEZING OR SHORTNESS OF BREATH   . albuterol (VENTOLIN HFA) 108 (90 Base) MCG/ACT inhaler Inhale 2 puffs into the lungs every 6 (six) hours as needed for wheezing or shortness of breath.   Marland Kitchen amLODipine (NORVASC) 5 MG tablet Take 5 mg by mouth daily.   . Artificial Tear Solution (SOOTHE XP) SOLN Place 1 drop into both eyes 2 (two) times daily as needed (irritation).   Marland Kitchen aspirin EC 81 MG tablet Take 81 mg by mouth daily.   Marland Kitchen azelastine (ASTELIN) 0.1 % nasal spray Place 2 sprays into both nostrils 2 (two) times daily. Use in each nostril as directed (Patient taking differently: Place 2 sprays into both nostrils 2 (two) times daily as needed for rhinitis or allergies. Use in each nostril as directed)   . diazepam (VALIUM) 5 MG tablet TAKE 1 TABLET BY MOUTH EVERY NIGHT AT BEDTIME AND MAY TAKE AN ADDITIONAL TABLET AS NEEDED FOR ANXIETY   . diphenhydrAMINE (BENADRYL) 25 MG tablet Take 25 mg by  mouth 3 (three) times daily as needed for allergies.   . fluorometholone (FML) 0.1 % ophthalmic suspension Place 1 drop into both eyes as directed. Tapering off   . fluticasone (FLONASE) 50 MCG/ACT nasal spray Place 1 spray into both nostrils daily. (Patient taking differently: Place 1 spray into both nostrils as needed. )   . Fluticasone-Salmeterol (WIXELA INHUB) 250-50 MCG/DOSE AEPB INHALE 1 PUFF INTO THE LUNGS TWICE DAILY   . Fluticasone-Umeclidin-Vilant (TRELEGY ELLIPTA) 100-62.5-25 MCG/INH AEPB Inhale 1 puff into the lungs daily.   . metroNIDAZOLE (FLAGYL) 500 MG tablet Take 1 tablet (500 mg total) by mouth 2 (two) times daily.   . montelukast (SINGULAIR) 10 MG tablet Take 10 mg by mouth at bedtime.    . Probiotic Product (PROBIOTIC DAILY PO) Take 1 capsule by mouth daily.    . roflumilast (DALIRESP) 500 MCG TABS tablet Take 1 tablet (500 mcg total) by mouth daily.   Marland Kitchen SPIRIVA HANDIHALER 18 MCG inhalation capsule INHALE 1 CAPSULE VIA HANDIHALER EVERY DAY AT THE SAME TIME   . SPIRIVA HANDIHALER 18 MCG inhalation capsule    . SYNTHROID 88 MCG tablet TAKE 1 TABLET BY MOUTH DAILY   . triamcinolone cream (KENALOG) 0.1 % Apply 1 application topically 2 (two) times daily.   Marland Kitchen venlafaxine (EFFEXOR) 50 MG tablet TAKE 1 TABLET BY MOUTH  TWICE DAILY 08/03/2018: Usually only takes 1 tablet daily, takes additional tablet in the evening PRN anxiety  . venlafaxine (EFFEXOR) 50 MG tablet    . [DISCONTINUED] WIXELA INHUB 250-50 MCG/DOSE AEPB INHALE 1 PUFF INTO THE LUNGS TWICE DAILY    No facility-administered encounter medications on file as of 09/03/2018.     Allergies:  Allergies  Allergen Reactions  . Losartan Other (See Comments)    hyperkalemia  . Penicillins Hives, Swelling and Rash    Has patient had a PCN reaction causing immediate rash, facial/tongue/throat swelling, SOB or lightheadedness with hypotension: YES Has patient had a PCN reaction causing severe rash involving mucus membranes or skin  necrosis: NO Has patient had a PCN reaction that required hospitalization NO Has patient had a PCN reaction occurring within the last 10 years: NO If all of the above answers are "NO", then may proceed with Cephalosporin use.   . Cefixime [Kdc:Cefixime] Other (See Comments)    unspecified  . Indocin [Indomethacin] Other (See Comments)  Painful tongue and throat  . Singulair [Montelukast Sodium]     Chest tightness   . Sulfa Antibiotics Other (See Comments)    unspecified  . Ace Inhibitors Other (See Comments)    cough  . Statins Other (See Comments)    Myalgias     Family History: Family History  Problem Relation Age of Onset  . Goiter Mother   . Psoriasis Father        od liver  . Cirrhosis Father   . Diabetes Daughter        Type 2  . Stroke Maternal Grandfather   . Stroke Paternal Grandmother   . Hypertension Paternal Grandmother   . Hernia Paternal Grandmother   . Cancer Paternal Grandfather        lung  . Cancer Maternal Aunt        cancer of the stomach    Social History: Social History   Tobacco Use  . Smoking status: Former Smoker    Packs/day: 1.50    Years: 34.00    Pack years: 51.00    Types: Cigarettes    Start date: 36    Quit date: 02/03/1990    Years since quitting: 28.6  . Smokeless tobacco: Never Used  Substance Use Topics  . Alcohol use: Not Currently    Comment: Rarely  . Drug use: No   Social History   Social History Narrative   Widowed since fall 2014   Lives in [place], [place].      Worked as Research officer, trade union, retired 2017.   She has support of her daughter and grandson. She is independent with all care needs and transportation at this time. 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    She reports she is on SSI and receives another separate check    She reports her work hx included working in Engineer, mining for [employer] and volunteered in a  [employer]   She reports having a short hair dog for 2 years  but denies noting any allergic reactions   Right handed   Two story home    Review of Systems:  CONSTITUTIONAL: No fevers, chills, night sweats, or weight loss.   EYES: No visual changes or eye pain ENT: No hearing changes.  No history of nose bleeds.   RESPIRATORY: No cough, wheezing and shortness of breath.   CARDIOVASCULAR: Negative for chest pain, and palpitations.   GI: Negative for abdominal discomfort, blood in stools or black stools.  No recent change in bowel habits.   GU:  No history of incontinence.   MUSCLOSKELETAL: No history of joint pain or swelling.  No myalgias.   SKIN: Negative for lesions, rash, and itching.   HEMATOLOGY/ONCOLOGY: Negative for prolonged bleeding, bruising easily, and swollen nodes.  No history of cancer.   ENDOCRINE: Negative for cold or heat intolerance, polydipsia or goiter.   PSYCH:  No depression or anxiety symptoms.   NEURO: As Above.   Vital Signs:  Ht 5\' 1"  (1.549 m)   Wt 134 lb (60.8 kg)   BMI 25.32 kg/m    General Medical Exam:  Well appearing, comfortable.  Nonlabored breathing.  No deformity or edema.  No rash.  Neurological Exam: MENTAL STATUS including orientation to time, place, person, recent and remote memory, attention span and concentration, language, and fund of knowledge is normal.  Speech is not dysarthric.  CRANIAL NERVES:  Normal conjugate, extra-ocular eye movements in all directions of gaze.  No ptosis.  Normal facial symmetry and movements.  Normal shoulder shrug and head rotation.  Tongue is midline.  MOTOR:  Antigravity in all extremities, reduced range of motion with flexing the toes bilaterally.  No abnormal movements.  No pronator drift.   SENSORY & REFLEXES:  Unable to assess.   COORDINATION/GAIT:   Intact rapid alternating movements bilaterally.  Gait narrow based and stable.    IMPRESSION: Bilateral feet paresthesias, most suggestive of  distal symmetric peripheral neuropathy.  She does not have diabetes, history of alcohol use, or family history of neuropathy.  She will return to the office for formal neuromuscular exam and NCS/EMG of the legs.  Patient educated on daily foot inspection, fall prevention, and safety precautions around the home.   Follow Up Instructions:  I discussed the assessment and treatment plan with the patient. The patient was provided an opportunity to ask questions and all were answered. The patient agreed with the plan and demonstrated an understanding of the instructions.   The patient was advised to call back or seek an in-person evaluation if the symptoms worsen or if the condition fails to improve as anticipated.  Total Time spent:  45 min   Alda Berthold, DO

## 2018-09-03 NOTE — Telephone Encounter (Signed)
Pt was called and given results she verbalized understanding

## 2018-09-03 NOTE — Telephone Encounter (Signed)
Please inform patient the following information: Urine culture is negative for infection.

## 2018-09-04 DIAGNOSIS — S2239XA Fracture of one rib, unspecified side, initial encounter for closed fracture: Secondary | ICD-10-CM

## 2018-09-04 HISTORY — DX: Fracture of one rib, unspecified side, initial encounter for closed fracture: S22.39XA

## 2018-09-05 ENCOUNTER — Emergency Department (HOSPITAL_BASED_OUTPATIENT_CLINIC_OR_DEPARTMENT_OTHER): Payer: Medicare Other

## 2018-09-05 ENCOUNTER — Other Ambulatory Visit: Payer: Self-pay

## 2018-09-05 ENCOUNTER — Emergency Department (HOSPITAL_BASED_OUTPATIENT_CLINIC_OR_DEPARTMENT_OTHER)
Admission: EM | Admit: 2018-09-05 | Discharge: 2018-09-05 | Disposition: A | Discharge status: Home / Self Care | Payer: Medicare Other | Attending: Emergency Medicine | Admitting: Emergency Medicine

## 2018-09-05 ENCOUNTER — Encounter (HOSPITAL_BASED_OUTPATIENT_CLINIC_OR_DEPARTMENT_OTHER): Payer: Self-pay | Admitting: Emergency Medicine

## 2018-09-05 DIAGNOSIS — Z7982 Long term (current) use of aspirin: Secondary | ICD-10-CM | POA: Diagnosis not present

## 2018-09-05 DIAGNOSIS — R079 Chest pain, unspecified: Secondary | ICD-10-CM | POA: Diagnosis not present

## 2018-09-05 DIAGNOSIS — Z87891 Personal history of nicotine dependence: Secondary | ICD-10-CM | POA: Diagnosis not present

## 2018-09-05 DIAGNOSIS — Y9289 Other specified places as the place of occurrence of the external cause: Secondary | ICD-10-CM | POA: Insufficient documentation

## 2018-09-05 DIAGNOSIS — Y93K1 Activity, walking an animal: Secondary | ICD-10-CM | POA: Insufficient documentation

## 2018-09-05 DIAGNOSIS — M7989 Other specified soft tissue disorders: Secondary | ICD-10-CM | POA: Diagnosis not present

## 2018-09-05 DIAGNOSIS — Z79899 Other long term (current) drug therapy: Secondary | ICD-10-CM | POA: Diagnosis not present

## 2018-09-05 DIAGNOSIS — M25521 Pain in right elbow: Secondary | ICD-10-CM | POA: Diagnosis not present

## 2018-09-05 DIAGNOSIS — E033 Postinfectious hypothyroidism: Secondary | ICD-10-CM | POA: Diagnosis not present

## 2018-09-05 DIAGNOSIS — W0110XA Fall on same level from slipping, tripping and stumbling with subsequent striking against unspecified object, initial encounter: Secondary | ICD-10-CM | POA: Insufficient documentation

## 2018-09-05 DIAGNOSIS — J449 Chronic obstructive pulmonary disease, unspecified: Secondary | ICD-10-CM | POA: Insufficient documentation

## 2018-09-05 DIAGNOSIS — Z9981 Dependence on supplemental oxygen: Secondary | ICD-10-CM | POA: Insufficient documentation

## 2018-09-05 DIAGNOSIS — I959 Hypotension, unspecified: Secondary | ICD-10-CM | POA: Diagnosis not present

## 2018-09-05 DIAGNOSIS — R52 Pain, unspecified: Secondary | ICD-10-CM | POA: Diagnosis not present

## 2018-09-05 DIAGNOSIS — Y999 Unspecified external cause status: Secondary | ICD-10-CM | POA: Insufficient documentation

## 2018-09-05 DIAGNOSIS — S59901A Unspecified injury of right elbow, initial encounter: Secondary | ICD-10-CM | POA: Insufficient documentation

## 2018-09-05 DIAGNOSIS — I10 Essential (primary) hypertension: Secondary | ICD-10-CM | POA: Diagnosis not present

## 2018-09-05 DIAGNOSIS — S299XXA Unspecified injury of thorax, initial encounter: Secondary | ICD-10-CM | POA: Diagnosis present

## 2018-09-05 DIAGNOSIS — S2231XA Fracture of one rib, right side, initial encounter for closed fracture: Secondary | ICD-10-CM | POA: Diagnosis not present

## 2018-09-05 DIAGNOSIS — S50311A Abrasion of right elbow, initial encounter: Secondary | ICD-10-CM | POA: Diagnosis not present

## 2018-09-05 DIAGNOSIS — W19XXXA Unspecified fall, initial encounter: Secondary | ICD-10-CM | POA: Diagnosis not present

## 2018-09-05 MED ORDER — OXYCODONE HCL 5 MG PO TABS: 5.0000 mg | ORAL_TABLET | Freq: Four times a day (QID) | ORAL | 0 refills | Status: DC | PRN

## 2018-09-05 MED ORDER — HYDROCODONE-ACETAMINOPHEN 5-325 MG PO TABS
1.0000 | ORAL_TABLET | Freq: Once | ORAL | Status: AC
  Administered 2018-09-05: 18:00:00 1 via ORAL
  Filled 2018-09-05: qty 1

## 2018-09-05 NOTE — Discharge Instructions (Addendum)
Use incentive spirometer as discussed.  Usual lidocaine spray, Tylenol, Motrin.

## 2018-09-05 NOTE — ED Provider Notes (Signed)
[facility] EMERGENCY DEPARTMENT Provider Note   CSN: [id number] Arrival date & time: 09/05/18  1702    History   Chief Complaint Chief Complaint  Patient presents with  . Fall    HPI Priscilla Cantrell is a 83 y.o. female.     Patient was walking her dog when he pulled her over.  She hit the right side of her ribs.  Not hit her head.  No loss of consciousness.  Has abrasion to the right elbow.  Was ambulatory after the fall.  No difficulty breathing.  The history is provided by the patient.  Fall This is a new problem. The current episode started 1 to 2 hours ago. The problem occurs constantly. The problem has not changed since onset.Associated symptoms include chest pain. Pertinent negatives include no abdominal pain, no headaches and no shortness of breath. The symptoms are aggravated by twisting. Nothing relieves the symptoms. She has tried nothing for the symptoms. The treatment provided no relief.    Past Medical History:  Diagnosis Date  . Allergic rhinitis    with upper airway cough: as of 07/2017 pulm f/u she was instructed to use astelin, flonase, and saline more consistently  . BPPV (benign paroxysmal positional vertigo) 03/2018  . Chest pain, non-cardiac    Cardiac CT showed no signif obstructive dz, mildly elevated calcium level (Dr. [name]).  . Chronic cystitis with hematuria    [name]  . Chronic hypoxemic respiratory failure ([place]) 02/02/2015   2L oxygen 24/7  . COPD (chronic obstructive pulmonary disease) (HCC)    GOLD 4.  spirometry 01/10/09 FEV 0.97(52%), FEV1% 47.  With chronic bronchitis as of 07/2017.  . DDD (degenerative disc disease), cervical    1998 MRI C5-6 impingemt (left)  . DDD (degenerative disc disease), lumbar   . Depression with anxiety 04/15/2011  . Diarrheal disease Summer 2017   ? 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 Dr  Earlean Shawl).  As of 10/05/15 pt reports GI dx'd her with C diff and rx'd more flagyl and she is also on cholestyramine.  As of 02/2016, pt's sx's resolved completely.  . Diverticulosis of colon    Procto '97 and colonoscopy 2002  . Dysplastic nevus of upper extremity 04/2014   R tricep ([name])  . Edema of both lower extremities due to peripheral venous insufficiency    Varicosities bilat, swelling L>R.  R baker's cyst.  Got vein clinic eval 02/2018->sclerotherapy recommended but since no hemorrhage or ulceration, insurance will not cover the procedure.  . Family history of adverse reaction to anesthesia    mother died when patient age 33 receiving anesthesia for thyroid surgery  . Fibrocystic breast disease    w/fibroadenoma.  Bx's showed NO atypia (Dr. Margot Chimes).  Mammo neg 2008.  Marland Kitchen GERD (gastroesophageal reflux disease)   . History of double vision    Ophthalmologist, Dr. Herbert Deaner, is further evaluating this with MRI  orbits and limited brain (myesthenia gravis testing neg 07/2010)  . History of home oxygen therapy    uses 2 liters at hs  . Hypertension    EKG 03/2010 normal  . Hypothyroidism    Hashimoto's, dx'd 1989  . Lesion of right native kidney 2017   found on CT 02/2015.  F/u MRI 03/31/15 showed it to be smaller and less likely of any significance but repeat CT renal protocol in 6mo was recommended by radiology.  This was done  10/26/15 and showed no interval change in the lesion, but b/c the lesion enhances with contrast, radiol rec'd urol consult (b/c pap renal RCC could not be excluded).  Saw urol 03/06/16--stable on u/s 09/2016 and 09/2017.  . Numbness in feet 04/2018   S1 distribution bilat, c/w spinal stenosis sensory neuropathy (no pain).  . Osteoarthritis, hip, bilateral    she is s/p bilat THA as of 02/2018.  Marland Kitchen Pneumonia   . Postmenopausal atrophic vaginitis    improved with estrace 2 X/week.  . Pulmonary nodule    CT chest 12/10, June 2011.  No nodule on CT 06/2010.  Marland Kitchen Recurrent  Clostridium difficile diarrhea    Episode 09/19/2016: resolved with prolonged course of flagyl.  11/2016 recurrence treated with 10d of Dificid (Fidaxomicin) by GI (Dr. Earlean Shawl).  . Recurrent UTI    likely related to pt's atrophic vaginitis.  Urol started vaginal estrace 03/2016.  Marland Kitchen Shingles 02/2014   R abd/flank  . Spinal stenosis of lumbar region with neurogenic claudication    2008 MRI (L3-4, L4-5, L5-S1).  +S1 distribution sensory loss bilat    Patient Active Problem List   Diagnosis Date Noted  . Medication management 07/29/2018  . Overweight (BMI 25.0-29.9) 12/09/2017  . S/P left KA 12/08/2017  . S/P left THA, AA 12/08/2017  . Postherpetic neuralgia 03/07/2015  . Constipation 02/28/2015  . Renal mass 02/28/2015  . Acute pyelonephritis 02/20/2015  . Oral thrush 02/08/2015  . Chronic hypoxemic respiratory failure ([place]) 02/02/2015  . SOB (shortness of breath)   . Essential hypertension 01/24/2015  . UTI (urinary tract infection) 01/09/2015  . Pain and swelling of right lower leg 01/09/2015  . H/O total hip arthroplasty 11/01/2014  . Spinal stenosis, lumbar region, with neurogenic claudication 01/04/2014  . Osteoarthritis, hip, bilateral 07/27/2013  . Sciatica associated with disorder of lumbosacral spine 04/19/2013  . Atrophic vaginitis 01/12/2012  . Periodic limb movement disorder 11/24/2011  . Hypoxia 05/22/2011  . Depression with anxiety 04/15/2011  . Chronic venous insufficiency 07/29/2010  . Hypothyroidism due to Hashimoto's thyroiditis 01/07/2010  . SPINAL STENOSIS 01/07/2010  . Allergic rhinitis 08/09/2007  . COPD GOLD III B 08/09/2007    Past Surgical History:  Procedure Laterality Date  . ABDOMINAL HYSTERECTOMY  1978   no BSO per pt--nonmalignant reasons  . APPENDECTOMY    . BREAST CYST EXCISION     Last screening mammogram 10/2010 was normal.  . CHOLECYSTECTOMY  2001  . COLONOSCOPY  04/2005; 12/06/2015   2007 NORMAL.  2017 adenomatous polyp x 1.  Mod sigmoid  diverticulosis (Dr. Earlean Shawl).    Marland Kitchen DEXA  05/18/2017   NORMAL (T-score 0.1)  . HIP CLOSED REDUCTION Right 12/25/2015   Procedure: CLOSED REDUCTION HIP;  Surgeon: Nicholes Stairs, MD;  Location: [facility];  Service: Orthopedics;  Laterality: Right;  . TONSILLECTOMY    . TOTAL HIP ARTHROPLASTY Right 11/01/2014   Procedure: RIGHT TOTAL HIP ARTHROPLASTY;  Surgeon: Latanya Maudlin, MD;  Location: [facility];  Service: Orthopedics;  Laterality: Right;  . TOTAL HIP ARTHROPLASTY Left 12/08/2017   Procedure: LEFT TOTAL HIP ARTHROPLASTY ANTERIOR APPROACH;  Surgeon: Paralee Cancel, MD;  Location: [facility];  Service: Orthopedics;  Laterality: Left;  70 mins  . TRANSTHORACIC ECHOCARDIOGRAM  08/2014   EF 60-65%, grade I diast dysfxn  . TUBAL LIGATION  1974     OB History   No obstetric history on file.      Home Medications    Prior to Admission medications  Medication Sig Start Date End Date Taking? Authorizing Provider  albuterol (PROAIR HFA) 108 (90 Base) MCG/ACT inhaler Inhale 2 puffs into the lungs every 4 (four) hours as needed for wheezing or shortness of breath. 02/04/18  Yes McGowen, Adrian Blackwater, MD  amLODipine (NORVASC) 5 MG tablet Take 5 mg by mouth daily.   Yes [provider]  Artificial Tear Solution (SOOTHE XP) SOLN Place 1 drop into both eyes 2 (two) times daily as needed (irritation).   Yes [provider]  aspirin EC 81 MG tablet Take 81 mg by mouth daily.   Yes [provider]  azelastine (ASTELIN) 0.1 % nasal spray Place 2 sprays into both nostrils 2 (two) times daily. Use in each nostril as directed Patient taking differently: Place 2 sprays into both nostrils 2 (two) times daily as needed for rhinitis or allergies. Use in each nostril as directed 03/11/17  Yes McGowen, Adrian Blackwater, MD  diazepam (VALIUM) 5 MG tablet TAKE 1 TABLET BY MOUTH EVERY NIGHT AT BEDTIME AND MAY TAKE AN ADDITIONAL TABLET AS NEEDED FOR ANXIETY 08/23/18  Yes McGowen, Adrian Blackwater, MD  diphenhydrAMINE  (BENADRYL) 25 MG tablet Take 25 mg by mouth 3 (three) times daily as needed for allergies.   Yes [provider]  Fluticasone-Salmeterol (WIXELA INHUB) 250-50 MCG/DOSE AEPB INHALE 1 PUFF INTO THE LUNGS TWICE DAILY 09/01/18  Yes Chesley Mires, MD  metroNIDAZOLE (FLAGYL) 500 MG tablet Take 1 tablet (500 mg total) by mouth 2 (two) times daily. 09/02/18  Yes Kuneff, Renee A, DO  montelukast (SINGULAIR) 10 MG tablet Take 10 mg by mouth at bedtime.  02/05/18  Yes [provider]  Probiotic Product (PROBIOTIC DAILY PO) Take 1 capsule by mouth daily.    Yes [provider]  roflumilast (DALIRESP) 500 MCG TABS tablet Take 1 tablet (500 mcg total) by mouth daily. 08/09/18  Yes Lauraine Rinne, NP  SPIRIVA HANDIHALER 18 MCG inhalation capsule INHALE 1 CAPSULE VIA HANDIHALER EVERY DAY AT THE SAME TIME 07/22/18  Yes Chesley Mires, MD  SPIRIVA HANDIHALER 18 MCG inhalation capsule  07/22/18  Yes [provider]  SYNTHROID 88 MCG tablet TAKE 1 TABLET BY MOUTH DAILY 05/27/18  Yes McGowen, Adrian Blackwater, MD  venlafaxine (EFFEXOR) 50 MG tablet TAKE 1 TABLET BY MOUTH  TWICE DAILY 06/23/18  Yes McGowen, Adrian Blackwater, MD  albuterol (PROVENTIL) (2.5 MG/3ML) 0.083% nebulizer solution INHALE 1 VIAL VIA NEBULIZER EVERY 6 HOURS AS NEEDED FOR WHEEZING OR SHORTNESS OF BREATH 06/03/17   McGowen, Adrian Blackwater, MD  albuterol (VENTOLIN HFA) 108 (90 Base) MCG/ACT inhaler Inhale 2 puffs into the lungs every 6 (six) hours as needed for wheezing or shortness of breath. 09/01/18   Lauraine Rinne, NP  fluorometholone (FML) 0.1 % ophthalmic suspension Place 1 drop into both eyes as directed. Tapering off    [provider]  fluticasone (FLONASE) 50 MCG/ACT nasal spray Place 1 spray into both nostrils daily. Patient taking differently: Place 1 spray into both nostrils as needed.  01/21/17   Chesley Mires, MD  Fluticasone-Umeclidin-Vilant (TRELEGY ELLIPTA) 100-62.5-25 MCG/INH AEPB Inhale 1 puff into the lungs daily. 08/04/18    Martyn Ehrich, NP  oxyCODONE (ROXICODONE) 5 MG immediate release tablet Take 1 tablet (5 mg total) by mouth every 6 (six) hours as needed for up to 15 doses for severe pain. 09/05/18   Kiyomi Pallo, DO  triamcinolone cream (KENALOG) 0.1 % Apply 1 application topically 2 (two) times daily. 08/08/16   Howard Pouch  A, DO  venlafaxine (EFFEXOR) 50 MG tablet  04/22/18   [provider]    Family History Family History  Problem Relation Age of Onset  . Goiter Mother   . Psoriasis Father        od liver  . Cirrhosis Father   . Diabetes Daughter        Type 2  . Stroke Maternal Grandfather   . Stroke Paternal Grandmother   . Hypertension Paternal Grandmother   . Hernia Paternal Grandmother   . Cancer Paternal Grandfather        lung  . Cancer Maternal Aunt        cancer of the stomach    Social History Social History   Tobacco Use  . Smoking status: Former Smoker    Packs/day: 1.50    Years: 34.00    Pack years: 51.00    Types: Cigarettes    Start date: 32    Quit date: 02/03/1990    Years since quitting: 28.6  . Smokeless tobacco: Never Used  Substance Use Topics  . Alcohol use: Not Currently    Comment: Rarely  . Drug use: No     Allergies   Losartan, Penicillins, Cefixime [kdc:cefixime], Indocin [indomethacin], Singulair [montelukast sodium], Sulfa antibiotics, Ace inhibitors, and Statins   Review of Systems Review of Systems  Constitutional: Negative for chills and fever.  HENT: Negative for ear pain and sore throat.   Eyes: Negative for pain and visual disturbance.  Respiratory: Negative for cough and shortness of breath.   Cardiovascular: Positive for chest pain. Negative for palpitations.  Gastrointestinal: Negative for abdominal pain and vomiting.  Genitourinary: Negative for dysuria and hematuria.  Musculoskeletal: Negative for arthralgias and back pain.  Skin: Positive for wound. Negative for color change and rash.  Neurological: Negative for  seizures, syncope and headaches.  All other systems reviewed and are negative.    Physical Exam Updated Vital Signs BP 134/62 (BP Location: Right Arm)   Pulse 67   Temp 97.8 F (36.6 C) (Oral)   Resp 16   Ht 5' (1.524 m)   Wt 61.3 kg   SpO2 93%   BMI 26.41 kg/m   Physical Exam Vitals signs and nursing note reviewed.  Constitutional:      General: She is not in acute distress.    Appearance: She is well-developed.  HENT:     Head: Normocephalic and atraumatic.     Nose: Nose normal.     Mouth/Throat:     Mouth: Mucous membranes are moist.  Eyes:     Extraocular Movements: Extraocular movements intact.     Conjunctiva/sclera: Conjunctivae normal.     Pupils: Pupils are equal, round, and reactive to light.  Neck:     Musculoskeletal: Normal range of motion and neck supple. No muscular tenderness.  Cardiovascular:     Rate and Rhythm: Normal rate and regular rhythm.     Heart sounds: No murmur.  Pulmonary:     Effort: Pulmonary effort is normal. No respiratory distress.     Breath sounds: Normal breath sounds.  Abdominal:     Palpations: Abdomen is soft.     Tenderness: There is no abdominal tenderness.  Musculoskeletal: Normal range of motion.        General: Tenderness (TTP to right side of ribs, right elbow) present. No swelling.     Comments: No midline spinal tenderness  Skin:    General: Skin is warm and dry.  Comments: Abrasion to right elbow  Neurological:     General: No focal deficit present.     Mental Status: She is alert and oriented to person, place, and time.     Cranial Nerves: No cranial nerve deficit.     Sensory: No sensory deficit.     Motor: No weakness.     Coordination: Coordination normal.  Psychiatric:        Mood and Affect: Mood normal.      ED Treatments / Results  Labs (all labs ordered are listed, but only abnormal results are displayed) Labs Reviewed - No data to display  EKG None  Radiology Dg Chest 2 View  Result  Date: 09/05/2018 CLINICAL DATA:  Fall 2 hours ago.  Right chest pain. EXAM: CHEST - 2 VIEW COMPARISON:  02/01/2018 FINDINGS: Normal heart size. Aortic atherosclerosis. Lungs are clear. No pleural effusion or edema. Acute mildly displaced fracture deformity involves the lateral aspect of the seventh rib. Degenerative changes are noted within both glenohumeral joints and within the thoracic spine. IMPRESSION: 1. Acute right seventh rib fracture. Electronically Signed   By: Kerby Moors M.D.   On: 09/05/2018 18:29   Dg Elbow Complete Right  Result Date: 09/05/2018 CLINICAL DATA:  Fall today.  Right posterior elbow pain and swelling EXAM: RIGHT ELBOW - COMPLETE 3+ VIEW COMPARISON:  None FINDINGS: There is no evidence of fracture, dislocation, or joint effusion. There is no evidence of arthropathy or other focal bone abnormality. Soft tissues are unremarkable. IMPRESSION: Negative. Electronically Signed   By: Kerby Moors M.D.   On: 09/05/2018 18:31    Procedures Procedures (including critical care time)  Medications Ordered in ED Medications  HYDROcodone-acetaminophen (NORCO/VICODIN) 5-325 MG per tablet 1 tablet (1 tablet Oral Given 09/05/18 1730)     Initial Impression / Assessment and Plan / ED Course  I have reviewed the triage vital signs and the nursing notes.  Pertinent labs & imaging results that were available during my care of the patient were reviewed by me and considered in my medical decision making (see chart for details).        Priscilla Cantrell is an 83 year old female history of COPD on oxygen intermittently who presents to the ED after fall.  Patient with right-sided rib pain, right elbow pain after getting pulled out while walking her dog.  She did not hit her head.  She is not on blood thinners.  Neurologically she is intact.  Clear breath sounds bilaterally.  Good respiratory effort.  Will get x-ray of her chest, right elbow.  Patient ready has an incentive spirometer at  home.  Will give Norco for pain.  No neck pain.  No back pain.  Patient with single right-sided rib fracture.  No pneumothorax.  Will prescribe Roxicodone.  No active narcotic prescription.  Patient ready has lidocaine spray at home, Motrin, Tylenol.  Overall has good respiratory effort.  Has oxygen at home as needed as well.  Given return precautions.  Recommend close follow-up primary care doctor.  Educated about using incentive spirometer to avoid pneumonia.  This chart was dictated using voice recognition software.  Despite best efforts to proofread,  errors can occur which can change the documentation meaning.    Final Clinical Impressions(s) / ED Diagnoses   Final diagnoses:  Closed fracture of one rib of right side, initial encounter    ED Discharge Orders         Ordered    oxyCODONE (ROXICODONE) 5 MG  immediate release tablet  Every 6 hours PRN     09/05/18 Luna, Adelino, DO 09/05/18 1836

## 2018-09-05 NOTE — ED Triage Notes (Addendum)
Arrived via EMS, due to fall @ 1530 while walking her dog, no LOC, pain to right flank area. BP 124/66.HR 68, O2sat 92% RA, (wears O2 at hs prn)

## 2018-09-05 NOTE — ED Notes (Signed)
ED Provider at bedside. 

## 2018-09-06 ENCOUNTER — Encounter: Payer: Self-pay | Admitting: Family Medicine

## 2018-09-06 ENCOUNTER — Telehealth: Payer: Self-pay | Admitting: Family Medicine

## 2018-09-06 NOTE — Telephone Encounter (Signed)
FYI. Please see below. Patient just wanted PCP to know.

## 2018-09-06 NOTE — Telephone Encounter (Signed)
Noted  

## 2018-09-06 NOTE — Telephone Encounter (Signed)
Patient left a VM on front desk phone that she broke a rib in a fall yesterday. She was treated at the ER in [facility]. They gave her pain medication. She is unable to drive to fill however she has a few left over from prior Rx. Patient said the ER doctor told her to notify her PCP. Patient requests call back at her home number.

## 2018-09-07 ENCOUNTER — Other Ambulatory Visit: Payer: Self-pay | Admitting: Pulmonary Disease

## 2018-09-07 MED ORDER — ALBUTEROL SULFATE HFA 108 (90 BASE) MCG/ACT IN AERS: 2.0000 | INHALATION_SPRAY | Freq: Four times a day (QID) | RESPIRATORY_TRACT | 3 refills | Status: DC | PRN

## 2018-09-08 ENCOUNTER — Emergency Department (HOSPITAL_BASED_OUTPATIENT_CLINIC_OR_DEPARTMENT_OTHER)
Admission: EM | Admit: 2018-09-08 | Discharge: 2018-09-08 | Disposition: A | Discharge status: Home / Self Care | Payer: Medicare Other | Attending: Emergency Medicine | Admitting: Emergency Medicine

## 2018-09-08 ENCOUNTER — Emergency Department (HOSPITAL_BASED_OUTPATIENT_CLINIC_OR_DEPARTMENT_OTHER): Payer: Medicare Other

## 2018-09-08 ENCOUNTER — Other Ambulatory Visit: Payer: Self-pay

## 2018-09-08 ENCOUNTER — Encounter (HOSPITAL_BASED_OUTPATIENT_CLINIC_OR_DEPARTMENT_OTHER): Payer: Self-pay | Admitting: Emergency Medicine

## 2018-09-08 DIAGNOSIS — I10 Essential (primary) hypertension: Secondary | ICD-10-CM | POA: Diagnosis not present

## 2018-09-08 DIAGNOSIS — Z96643 Presence of artificial hip joint, bilateral: Secondary | ICD-10-CM | POA: Diagnosis not present

## 2018-09-08 DIAGNOSIS — W1839XD Other fall on same level, subsequent encounter: Secondary | ICD-10-CM | POA: Diagnosis not present

## 2018-09-08 DIAGNOSIS — S2231XD Fracture of one rib, right side, subsequent encounter for fracture with routine healing: Secondary | ICD-10-CM

## 2018-09-08 DIAGNOSIS — E063 Autoimmune thyroiditis: Secondary | ICD-10-CM | POA: Diagnosis not present

## 2018-09-08 DIAGNOSIS — Z79899 Other long term (current) drug therapy: Secondary | ICD-10-CM | POA: Insufficient documentation

## 2018-09-08 DIAGNOSIS — E038 Other specified hypothyroidism: Secondary | ICD-10-CM | POA: Diagnosis not present

## 2018-09-08 DIAGNOSIS — Z87891 Personal history of nicotine dependence: Secondary | ICD-10-CM | POA: Diagnosis not present

## 2018-09-08 DIAGNOSIS — J449 Chronic obstructive pulmonary disease, unspecified: Secondary | ICD-10-CM | POA: Diagnosis not present

## 2018-09-08 DIAGNOSIS — Z7982 Long term (current) use of aspirin: Secondary | ICD-10-CM | POA: Diagnosis not present

## 2018-09-08 DIAGNOSIS — R05 Cough: Secondary | ICD-10-CM | POA: Insufficient documentation

## 2018-09-08 DIAGNOSIS — R0602 Shortness of breath: Secondary | ICD-10-CM

## 2018-09-08 MED ORDER — DICLOFENAC SODIUM 1 % TD GEL: 4.0000 g | Freq: Four times a day (QID) | TRANSDERMAL | 1 refills | Status: DC

## 2018-09-08 MED ORDER — LIDOCAINE 5 % EX PTCH: 1.0000 | MEDICATED_PATCH | CUTANEOUS | 0 refills | Status: DC

## 2018-09-08 MED ORDER — KETOROLAC TROMETHAMINE 60 MG/2ML IM SOLN
30.0000 mg | Freq: Once | INTRAMUSCULAR | Status: AC
  Administered 2018-09-08: 16:00:00 30 mg via INTRAMUSCULAR
  Filled 2018-09-08: qty 2

## 2018-09-08 MED ORDER — HYDROCODONE-ACETAMINOPHEN 5-325 MG PO TABS: 1.0000 | ORAL_TABLET | Freq: Four times a day (QID) | ORAL | 0 refills | Status: DC | PRN

## 2018-09-08 NOTE — Discharge Instructions (Signed)
Start taking a stool softener to make sure you do not get constipated.  Use the Voltaren gel and Lidoderm patches for pain in the hydrocodone as needed.  Do not use hydrocodone and oxycodone at the same time.  If symptoms continue to worsen then please return but if they seem to be about the same you need to follow-up with your doctor.  Also make sure you are using the breathing machine regularly to make sure you are getting deep breaths and coughing.

## 2018-09-08 NOTE — ED Triage Notes (Signed)
Pt fell and broke ribs a couple days ago and her DC instructions said if she had increasing SOB with these broken ribs to come back. She is back.

## 2018-09-08 NOTE — ED Provider Notes (Signed)
[place] EMERGENCY DEPARTMENT Provider Note   CSN: [id number] Arrival date & time: 09/08/18  1458     History   Chief Complaint Chief Complaint  Patient presents with   Shortness of Breath    HPI Priscilla Cantrell is a 83 y.o. female.     Patient is an 83 year old female with a history of hypothyroidism, COPD who is presenting today with persistent right-sided chest pain and now shortness of breath.  Patient fell on Sunday when her dog pulled her over resulting in 7th right rib fracture.  She was given oxycodone but states that did not really help with her pain and she had some hydrocodone leftover that seemed to help a little bit more.  She states the pain was okay but today the pain became much worse and it made her feel like it was difficult to breathe.  She did use the incentive spirometer multiple times and was able to cough but it hurts badly.  She tried lidocaine patches and extra strength Tylenol today but states it has not helped.  Because she was having more shortness of breath and her discharge paperwork recommended she return so she is back for evaluation.  She feels like the shortness of breath is mostly related to the pain caused by breathing.  She denies any fever or mucus production.  No vomiting or diarrhea.  The history is provided by the patient.  Shortness of Breath Severity:  Moderate Onset quality:  Gradual Duration:  4 days Timing:  Intermittent Progression:  Worsening Chronicity:  New Context: activity   Relieved by:  Rest Worsened by:  Deep breathing, coughing and activity Associated symptoms: chest pain and cough   Associated symptoms: no abdominal pain, no fever, no headaches, no sputum production, no vomiting and no wheezing     Past Medical History:  Diagnosis Date   Allergic rhinitis    with upper airway cough: as of 07/2017 pulm f/u she was instructed to use astelin, flonase, and saline more consistently   BPPV (benign paroxysmal  positional vertigo) 03/2018   Chest pain, non-cardiac    Cardiac CT showed no signif obstructive dz, mildly elevated calcium level (Dr. [name]).   Chronic cystitis with hematuria    [name]   Chronic hypoxemic respiratory failure ([place]) 02/02/2015   2L oxygen 24/7   COPD (chronic obstructive pulmonary disease) (HCC)    GOLD 4.  spirometry 01/10/09 FEV 0.97(52%), FEV1% 47.  With chronic bronchitis as of 07/2017.   DDD (degenerative disc disease), cervical    1998 MRI C5-6 impingemt (left)   DDD (degenerative disc disease), lumbar    Depression with anxiety 04/15/2011   Diarrheal disease Summer 2017   ? 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).  As of 10/05/15 pt reports GI dx'd her with C diff and rx'd more flagyl and she is also on cholestyramine.  As of 02/2016, pt's sx's resolved completely.   Diverticulosis of colon    Procto '97 and colonoscopy 2002   Dysplastic nevus of upper extremity 04/2014   R tricep ([name])   Edema of both lower extremities due to peripheral venous insufficiency    Varicosities bilat, swelling L>R.  R baker's cyst.  Got vein clinic eval 02/2018->sclerotherapy recommended but since no hemorrhage or ulceration, insurance will not cover the procedure.   Family history of adverse reaction to anesthesia    mother died when patient  age 77 receiving anesthesia for thyroid surgery   Fibrocystic breast disease    w/fibroadenoma.  Bx's showed NO atypia (Dr. Margot Chimes).  Mammo neg 2008.   GERD (gastroesophageal reflux disease)    History of double vision    Ophthalmologist, Dr. Herbert Deaner, is further evaluating this with MRI  orbits and limited brain (myesthenia gravis testing neg 07/2010)   History of home oxygen therapy    uses 2 liters at hs   Hypertension    EKG 03/2010 normal   Hypothyroidism    Hashimoto's, dx'd 1989   Lesion of right  native kidney 2017   found on CT 02/2015.  F/u MRI 03/31/15 showed it to be smaller and less likely of any significance but repeat CT renal protocol in 46mo was recommended by radiology.  This was done 10/26/15 and showed no interval change in the lesion, but b/c the lesion enhances with contrast, radiol rec'd urol consult (b/c pap renal RCC could not be excluded).  Saw urol 03/06/16--stable on u/s 09/2016 and 09/2017.   Numbness in feet 04/2018   S1 distribution bilat, c/w spinal stenosis sensory neuropathy (no pain).  PN being evaluated with NCS/EMG by Dr. Posey Pronto as of 08/2018.   Osteoarthritis, hip, bilateral    she is s/p bilat THA as of 02/2018.   Pneumonia    Postmenopausal atrophic vaginitis    improved with estrace 2 X/week.   Pulmonary nodule    CT chest 12/10, June 2011.  No nodule on CT 06/2010.   Recurrent Clostridium difficile diarrhea    Episode 09/19/2016: resolved with prolonged course of flagyl.  11/2016 recurrence treated with 10d of Dificid (Fidaxomicin) by GI (Dr. Earlean Shawl).   Recurrent UTI    likely related to pt's atrophic vaginitis.  Urol started vaginal estrace 03/2016.   Rib fracture 09/2018   R 7th, laterally-->sustained when her dog pulled her over and she fell.   Shingles 02/2014   R abd/flank   Spinal stenosis of lumbar region with neurogenic claudication    2008 MRI (L3-4, L4-5, L5-S1).  +S1 distribution sensory loss bilat    Patient Active Problem List   Diagnosis Date Noted   Medication management 07/29/2018   Overweight (BMI 25.0-29.9) 12/09/2017   S/P left KA 12/08/2017   S/P left THA, AA 12/08/2017   Postherpetic neuralgia 03/07/2015   Constipation 02/28/2015   Renal mass 02/28/2015   Acute pyelonephritis 02/20/2015   Oral thrush 02/08/2015   Chronic hypoxemic respiratory failure ([place]) 02/02/2015   SOB (shortness of breath)    Essential hypertension 01/24/2015   UTI (urinary tract infection) 01/09/2015   Pain and swelling of right lower  leg 01/09/2015   H/O total hip arthroplasty 11/01/2014   Spinal stenosis, lumbar region, with neurogenic claudication 01/04/2014   Osteoarthritis, hip, bilateral 07/27/2013   Sciatica associated with disorder of lumbosacral spine 04/19/2013   Atrophic vaginitis 01/12/2012   Periodic limb movement disorder 11/24/2011   Hypoxia 05/22/2011   Depression with anxiety 04/15/2011   Chronic venous insufficiency 07/29/2010   Hypothyroidism due to Hashimoto's thyroiditis 01/07/2010   SPINAL STENOSIS 01/07/2010   Allergic rhinitis 08/09/2007   COPD GOLD III B 08/09/2007    Past Surgical History:  Procedure Laterality Date   ABDOMINAL HYSTERECTOMY  1978   no BSO per pt--nonmalignant reasons   APPENDECTOMY     BREAST CYST EXCISION     Last screening mammogram 10/2010 was normal.   CHOLECYSTECTOMY  2001   COLONOSCOPY  04/2005; 12/06/2015   2007 NORMAL.  2017 adenomatous polyp x 1.  Mod sigmoid diverticulosis (Dr. Earlean Shawl).     DEXA  05/18/2017   NORMAL (T-score 0.1)   HIP CLOSED REDUCTION Right 12/25/2015   Procedure: CLOSED REDUCTION HIP;  Surgeon: Nicholes Stairs, MD;  Location: [facility];  Service: Orthopedics;  Laterality: Right;   TONSILLECTOMY     TOTAL HIP ARTHROPLASTY Right 11/01/2014   Procedure: RIGHT TOTAL HIP ARTHROPLASTY;  Surgeon: Latanya Maudlin, MD;  Location: [facility];  Service: Orthopedics;  Laterality: Right;   TOTAL HIP ARTHROPLASTY Left 12/08/2017   Procedure: LEFT TOTAL HIP ARTHROPLASTY ANTERIOR APPROACH;  Surgeon: Paralee Cancel, MD;  Location: [facility];  Service: Orthopedics;  Laterality: Left;  70 mins   TRANSTHORACIC ECHOCARDIOGRAM  08/2014   EF 60-65%, grade I diast dysfxn   TUBAL LIGATION  1974     OB History   No obstetric history on file.      Home Medications    Prior to Admission medications   Medication Sig Start Date End Date Taking? Authorizing Provider  albuterol (PROAIR HFA) 108 (90 Base) MCG/ACT inhaler Inhale 2 puffs into the  lungs every 4 (four) hours as needed for wheezing or shortness of breath. 02/04/18   McGowen, Adrian Blackwater, MD  albuterol (PROVENTIL) (2.5 MG/3ML) 0.083% nebulizer solution INHALE 1 VIAL VIA NEBULIZER EVERY 6 HOURS AS NEEDED FOR WHEEZING OR SHORTNESS OF BREATH 06/03/17   McGowen, Adrian Blackwater, MD  albuterol (VENTOLIN HFA) 108 (90 Base) MCG/ACT inhaler Inhale 2 puffs into the lungs every 6 (six) hours as needed for wheezing or shortness of breath. 09/07/18   Lauraine Rinne, NP  amLODipine (NORVASC) 5 MG tablet Take 5 mg by mouth daily.    [provider]  Artificial Tear Solution (SOOTHE XP) SOLN Place 1 drop into both eyes 2 (two) times daily as needed (irritation).    [provider]  aspirin EC 81 MG tablet Take 81 mg by mouth daily.    [provider]  azelastine (ASTELIN) 0.1 % nasal spray Place 2 sprays into both nostrils 2 (two) times daily. Use in each nostril as directed Patient taking differently: Place 2 sprays into both nostrils 2 (two) times daily as needed for rhinitis or allergies. Use in each nostril as directed 03/11/17   McGowen, Adrian Blackwater, MD  diazepam (VALIUM) 5 MG tablet TAKE 1 TABLET BY MOUTH EVERY NIGHT AT BEDTIME AND MAY TAKE AN ADDITIONAL TABLET AS NEEDED FOR ANXIETY 08/23/18   McGowen, Adrian Blackwater, MD  diphenhydrAMINE (BENADRYL) 25 MG tablet Take 25 mg by mouth 3 (three) times daily as needed for allergies.    [provider]  fluorometholone (FML) 0.1 % ophthalmic suspension Place 1 drop into both eyes as directed. Tapering off    [provider]  fluticasone (FLONASE) 50 MCG/ACT nasal spray Place 1 spray into both nostrils daily. Patient taking differently: Place 1 spray into both nostrils as needed.  01/21/17   Chesley Mires, MD  Fluticasone-Salmeterol [facility] INHUB) 250-50 MCG/DOSE AEPB INHALE 1 PUFF INTO THE LUNGS TWICE DAILY 09/01/18   Chesley Mires, MD  Fluticasone-Umeclidin-Vilant (TRELEGY ELLIPTA) 100-62.5-25 MCG/INH AEPB Inhale 1 puff into the  lungs daily. 08/04/18   Martyn Ehrich, NP  metroNIDAZOLE (FLAGYL) 500 MG tablet Take 1 tablet (500 mg total) by mouth 2 (two) times daily. 09/02/18   Kuneff, Renee A, DO  montelukast (SINGULAIR) 10 MG tablet Take 10 mg by mouth at bedtime.  02/05/18   [provider]  oxyCODONE (ROXICODONE) 5 MG  immediate release tablet Take 1 tablet (5 mg total) by mouth every 6 (six) hours as needed for up to 15 doses for severe pain. 09/05/18   Curatolo, Adam, DO  Probiotic Product (PROBIOTIC DAILY PO) Take 1 capsule by mouth daily.     [provider]  roflumilast (DALIRESP) 500 MCG TABS tablet Take 1 tablet (500 mcg total) by mouth daily. 08/09/18   Lauraine Rinne, NP  SPIRIVA HANDIHALER 18 MCG inhalation capsule INHALE 1 CAPSULE VIA HANDIHALER EVERY DAY AT THE SAME TIME 07/22/18   Chesley Mires, MD  [facility] HANDIHALER 18 MCG inhalation capsule  07/22/18   [provider]  SYNTHROID 88 MCG tablet TAKE 1 TABLET BY MOUTH DAILY 05/27/18   McGowen, Adrian Blackwater, MD  triamcinolone cream (KENALOG) 0.1 % Apply 1 application topically 2 (two) times daily. 08/08/16   Raoul Pitch, Renee A, DO  venlafaxine (EFFEXOR) 50 MG tablet TAKE 1 TABLET BY MOUTH  TWICE DAILY 06/23/18   McGowen, Adrian Blackwater, MD  venlafaxine [facility]) 50 MG tablet  04/22/18   [provider]    Family History Family History  Problem Relation Age of Onset   Goiter Mother    Psoriasis Father        od liver   Cirrhosis Father    Diabetes Daughter        Type 2   Stroke Maternal Grandfather    Stroke Paternal Grandmother    Hypertension Paternal Grandmother    Hernia Paternal Grandmother    Cancer Paternal Grandfather        lung   Cancer Maternal Aunt        cancer of the stomach    Social History Social History   Tobacco Use   Smoking status: Former Smoker    Packs/day: 1.50    Years: 34.00    Pack years: 51.00    Types: Cigarettes    Start date: 1958    Quit date: 02/03/1990    Years since quitting: 28.6     Smokeless tobacco: Never Used  Substance Use Topics   Alcohol use: Not Currently    Comment: Rarely   Drug use: No     Allergies   Losartan, Penicillins, Cefixime [kdc:cefixime], Indocin [indomethacin], Singulair [montelukast sodium], Sulfa antibiotics, Ace inhibitors, and Statins   Review of Systems Review of Systems  Constitutional: Negative for fever.  Respiratory: Positive for cough and shortness of breath. Negative for sputum production and wheezing.   Cardiovascular: Positive for chest pain.  Gastrointestinal: Negative for abdominal pain and vomiting.  Neurological: Negative for headaches.  All other systems reviewed and are negative.    Physical Exam Updated Vital Signs BP 125/69 (BP Location: Right Arm)    Pulse 64    Temp 98.8 F (37.1 C) (Oral)    Resp 20    Ht 5' (1.524 m)    Wt 61.2 kg    SpO2 (!) 88%    BMI 26.37 kg/m   Physical Exam Vitals signs and nursing note reviewed.  Constitutional:      General: She is not in acute distress.    Appearance: She is well-developed and normal weight.  HENT:     Head: Normocephalic and atraumatic.  Eyes:     Conjunctiva/sclera: Conjunctivae normal.     Pupils: Pupils are equal, round, and reactive to light.  Neck:     Musculoskeletal: Normal range of motion and neck supple.  Cardiovascular:     Rate and Rhythm: Normal rate and regular  rhythm.     Pulses: Normal pulses.     Heart sounds: No murmur.  Pulmonary:     Effort: Pulmonary effort is normal. Tachypnea present. No respiratory distress.     Breath sounds: Decreased breath sounds present. No wheezing or rales.  Chest:     Chest wall: Tenderness present.  Abdominal:     General: There is no distension.     Palpations: Abdomen is soft.     Tenderness: There is no abdominal tenderness. There is no guarding or rebound.  Musculoskeletal: Normal range of motion.        General: No tenderness.     Comments: Healing ecchymosis over the right elbow  Skin:     General: Skin is warm and dry.     Capillary Refill: Capillary refill takes less than 2 seconds.     Findings: No erythema or rash.  Neurological:     Mental Status: She is alert and oriented to person, place, and time.  Psychiatric:        Behavior: Behavior normal.      ED Treatments / Results  Labs (all labs ordered are listed, but only abnormal results are displayed) Labs Reviewed - No data to display  EKG None  Radiology Dg Chest 2 View  Result Date: 09/08/2018 CLINICAL DATA:  Shortness of breath, chest wall pain EXAM: CHEST - 2 VIEW COMPARISON:  09/05/2018 FINDINGS: Stable cardiomediastinal contours. Calcific aortic knob. Lungs are clear without focal airspace consolidation, pleural effusion, or pneumothorax. Redemonstrated mildly displaced lateral right seventh rib fracture. IMPRESSION: Right lateral seventh rib fracture without pneumothorax. Electronically Signed   By: Davina Poke M.D.   On: 09/08/2018 16:28    Procedures Procedures (including critical care time)  Medications Ordered in ED Medications  ketorolac (TORADOL) injection 30 mg (has no administration in time range)     Initial Impression / Assessment and Plan / ED Course  I have reviewed the triage vital signs and the nursing notes.  Pertinent labs & imaging results that were available during my care of the patient were reviewed by me and considered in my medical decision making (see chart for details).       Patient returning today after having persistent worsening chest pain and now some shortness of breath.  Patient had a fall on Sunday evening and has a known rib fracture.  She has been trying the oxycodone but states it did not help so tried some leftover hydrocodone that she had at home with some improvement.  This morning she has had Tylenol and tried a lidocaine patch but states she still has shortness of breath and a lot of pain with breathing.  She has been using the incentive spirometer.   Upon arrival here patient was satting 88% on room air but at rest sitting in the bed and speaking to me in full sentences oxygen ranges from 90 to 94%.  Patient given pain control and will repeat x-ray to ensure no new changes or pneumothorax.  11:08 PM Chest x-ray is unchanged without pneumothorax or new pleural effusion or pneumonia.  After Toradol patient states the pain is better but still present.  Patient sats are between 89 and 94% on room air.  She has a walker at home, lift chair, rails in her shower to help her get around.  Encouraged her to continue to use the incentive spirometer and because she felt like hydrocodone was more helpful than the oxycodone she was given a prescription for that  and told to not use the oxycodone.  She was also given Voltaren gel and Lidoderm patches.  She was urged to follow-up with her doctor on Monday for recheck to ensure her oxygen was good and her pain was well controlled.  She was encouraged to return here if her symptoms worsened.  Final Clinical Impressions(s) / ED Diagnoses   Final diagnoses:  Closed fracture of one rib of right side with routine healing, subsequent encounter  Shortness of breath    ED Discharge Orders         Ordered    HYDROcodone-acetaminophen (NORCO/VICODIN) 5-325 MG tablet  Every 6 hours PRN     09/08/18 1742    lidocaine (LIDODERM) 5 %  Every 24 hours     09/08/18 1742    diclofenac sodium (VOLTAREN) 1 % GEL  4 times daily     09/08/18 1742           Blanchie Dessert, MD 09/08/18 2309

## 2018-09-09 ENCOUNTER — Other Ambulatory Visit: Payer: Self-pay

## 2018-09-09 NOTE — Telephone Encounter (Signed)
OK. I see that the ED doctor gave her #15 oxycodone tabs.  She can call if she needs more.-thx

## 2018-09-09 NOTE — Telephone Encounter (Signed)
FYI  Please see below

## 2018-09-09 NOTE — Telephone Encounter (Signed)
Patient called in. Is doing well with oxycodone Rx which is helping her sleep at night.

## 2018-09-09 NOTE — Telephone Encounter (Signed)
Pt was advised and will let us know if she needs more.

## 2018-09-09 NOTE — Patient Outreach (Signed)
[facility]) Care Management  09/09/2018  KIERSTYN BARANOWSKI 1935-12-08 [id number]    RN Health Coach received call from the patient to inform me of a fall she had.  Two patient identifiers given.  The patient states on 8/2 she went out on her deck in the back to take the dog out.  While walking down the steps a cat ran out and the dog went to chase it and pulled her and she fell. She states that she has a life alert and she pressed it several times and finally help came.  Advised the patient to call life alert to check the radius of her necklace for outside of the home in the yard as a preventative. The patient was taken to the ER.  On 8/5 the patient went back to the ER as she was told if she continued to have problems and she did.  She was given Hydrocodone 5-325 mg and colace.  She states that it does control her pain.  She denies any pain this morning. She states that she is not feeling any side effects from the medication and she is walking with her Rolator in the home, has a raise toilet seat, and she is sleeping in the recliner because it is easier for her right now.  She states that she does have some issues with breathing but feels that it comes from the soreness.  She is using her Spiriva and doing deep breathing exercises.  She has called her physician for a follow up and notification of her injury and waiting for a return call.  Advised the patient if she has not received a return call make sure to call the office back today.  She verbalized understanding.  RN Health Coach thanked the patient for her update.  Plan:  RN Health Coach will call the patient at the next scheduled outreach.  [place] will send the physician a copy of the note.  Lazaro Arms RN, BSN, [place] Direct Dial:  [phone]  Fax: [phone]

## 2018-09-13 ENCOUNTER — Other Ambulatory Visit: Payer: Self-pay | Admitting: Pharmacy Technician

## 2018-09-13 MED ORDER — OXYCODONE HCL 5 MG PO TABS: 5.0000 mg | ORAL_TABLET | Freq: Four times a day (QID) | ORAL | 0 refills | Status: DC | PRN

## 2018-09-13 NOTE — Telephone Encounter (Signed)
OK, oxycodone eRx'd.

## 2018-09-13 NOTE — Patient Outreach (Signed)
[facility] Care Management  09/13/2018  DOLORES EWING 07-Dec-1935 [id number]   Received patient portion(s) of patient assistance application for Trelegy, Ventolin HFA and Daliresp. Faxed completed application and required documents into [employer] and [employer].  Will follow up with companies in 5-7 business days to check status of applications.  Maud Deed Chana Bode [place] Certified Pharmacy Technician [place] Management Direct Dial:[phone]

## 2018-09-13 NOTE — Addendum Note (Signed)
Addended by: [name] on: 09/13/2018 09:27 AM   Modules accepted: Orders

## 2018-09-13 NOTE — Telephone Encounter (Signed)
Pt was notified.  

## 2018-09-13 NOTE — Telephone Encounter (Signed)
Patient only has 1 oxycodone left. Please send Rx to [facility].

## 2018-09-13 NOTE — Telephone Encounter (Signed)
Requesting refill for Oxycodone. Last written 09/05/18 (15,0).   Please advise, thanks.

## 2018-09-15 ENCOUNTER — Other Ambulatory Visit: Payer: Self-pay

## 2018-09-15 ENCOUNTER — Ambulatory Visit (INDEPENDENT_AMBULATORY_CARE_PROVIDER_SITE_OTHER): Payer: Medicare Other | Admitting: Family Medicine

## 2018-09-15 ENCOUNTER — Encounter: Payer: Self-pay | Admitting: Family Medicine

## 2018-09-15 VITALS — BP 117/65 | HR 74 | Temp 97.5°F | Ht 60.0 in | Wt 134.0 lb

## 2018-09-15 DIAGNOSIS — S2231XD Fracture of one rib, right side, subsequent encounter for fracture with routine healing: Secondary | ICD-10-CM

## 2018-09-15 NOTE — Progress Notes (Addendum)
Virtual Visit via Video Note  I connected with pt on 09/15/18 at  3:30 PM EDT by telephone (technology for a video enabled telemedicine application was not working) and verified that I am speaking with the correct person using two identifiers.  Location patient: home Location provider:work or home office Persons participating in the virtual visit: patient, provider  I discussed the limitations of evaluation and management by telephone and the availability of in person appointments. The patient expressed understanding and agreed to proceed.  Telephone visit is a necessity given the COVID-19 restrictions in place at the current time.  HPI: 83 y/o WF being seen today for recent rib fracture. Suffered fx of right side 7th rib in lateral portion on 09/05/18.  She was tripped up by a stray cat when she was going up her deck stairs.  Fell over onto [employer] onto concrete blocks. She returned to ED 09/08/18 for SOB but was found to be fine, x-ray no different.  Has been on oxycodone for pain.  Interim hx: Still in lots of pain, 1 oxycodone brings the pain down significantly. Still splinting when breathing.  Feels a little sedated/impaired with taking 1. She is using albut nebs instead of HFA. Applying ice/heat and this helps.  No SOB, diaphoresis, fevers, or dizziness.   ROS: See pertinent positives and negatives per HPI.  Past Medical History:  Diagnosis Date  . Allergic rhinitis    with upper airway cough: as of 07/2017 pulm f/u she was instructed to use astelin, flonase, and saline more consistently  . BPPV (benign paroxysmal positional vertigo) 03/2018  . Chest pain, non-cardiac    Cardiac CT showed no signif obstructive dz, mildly elevated calcium level (Dr. [name]).  . Chronic cystitis with hematuria    [name]  . Chronic hypoxemic respiratory failure ([place]) 02/02/2015   2L oxygen 24/7  . COPD (chronic obstructive pulmonary disease) (HCC)    GOLD 4.   spirometry 01/10/09 FEV 0.97(52%), FEV1% 47.  With chronic bronchitis as of 07/2017.  . DDD (degenerative disc disease), cervical    1998 MRI C5-6 impingemt (left)  . DDD (degenerative disc disease), lumbar   . Depression with anxiety 04/15/2011  . Diarrheal disease Summer 2017   ? 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).  As of 10/05/15 pt reports GI dx'd her with C diff and rx'd more flagyl and she is also on cholestyramine.  As of 02/2016, pt's sx's resolved completely.  . Diverticulosis of colon    Procto '97 and colonoscopy 2002  . Dysplastic nevus of upper extremity 04/2014   R tricep ([name])  . Edema of both lower extremities due to peripheral venous insufficiency    Varicosities bilat, swelling L>R.  R baker's cyst.  Got vein clinic eval 02/2018->sclerotherapy recommended but since no hemorrhage or ulceration, insurance will not cover the procedure.  . Family history of adverse reaction to anesthesia    mother died when patient age 10 receiving anesthesia for thyroid surgery  . Fibrocystic breast disease    w/fibroadenoma.  Bx's showed NO atypia (Dr. Margot Chimes).  Mammo neg 2008.  Marland Kitchen GERD (gastroesophageal reflux disease)   . History of double vision    Ophthalmologist, Dr. Herbert Deaner, is further evaluating this with MRI  orbits and limited brain (myesthenia gravis testing neg 07/2010)  . History of home oxygen therapy    uses 2 liters at hs  . Hypertension  EKG 03/2010 normal  . Hypothyroidism    Hashimoto's, dx'd 1989  . Lesion of right native kidney 2017   found on CT 02/2015.  F/u MRI 03/31/15 showed it to be smaller and less likely of any significance but repeat CT renal protocol in 71mo was recommended by radiology.  This was done 10/26/15 and showed no interval change in the lesion, but b/c the lesion enhances with contrast, radiol rec'd urol consult (b/c pap renal RCC could not be  excluded).  Saw urol 03/06/16--stable on u/s 09/2016 and 09/2017.  . Numbness in feet 04/2018   S1 distribution bilat, c/w spinal stenosis sensory neuropathy (no pain).  PN being evaluated with NCS/EMG by Dr. Posey Pronto as of 08/2018.  . Osteoarthritis, hip, bilateral    she is s/p bilat THA as of 02/2018.  Marland Kitchen Pneumonia   . Postmenopausal atrophic vaginitis    improved with estrace 2 X/week.  . Pulmonary nodule    CT chest 12/10, June 2011.  No nodule on CT 06/2010.  Marland Kitchen Recurrent Clostridium difficile diarrhea    Episode 09/19/2016: resolved with prolonged course of flagyl.  11/2016 recurrence treated with 10d of Dificid (Fidaxomicin) by GI (Dr. Earlean Shawl).  . Recurrent UTI    likely related to pt's atrophic vaginitis.  Urol started vaginal estrace 03/2016.  . Rib fracture 09/2018   R 7th, laterally-->sustained when her dog pulled her over and she fell.  . Shingles 02/2014   R abd/flank  . Spinal stenosis of lumbar region with neurogenic claudication    2008 MRI (L3-4, L4-5, L5-S1).  +S1 distribution sensory loss bilat    Past Surgical History:  Procedure Laterality Date  . ABDOMINAL HYSTERECTOMY  1978   no BSO per pt--nonmalignant reasons  . APPENDECTOMY    . BREAST CYST EXCISION     Last screening mammogram 10/2010 was normal.  . CHOLECYSTECTOMY  2001  . COLONOSCOPY  04/2005; 12/06/2015   2007 NORMAL.  2017 adenomatous polyp x 1.  Mod sigmoid diverticulosis (Dr. Earlean Shawl).    Marland Kitchen DEXA  05/18/2017   NORMAL (T-score 0.1)  . HIP CLOSED REDUCTION Right 12/25/2015   Procedure: CLOSED REDUCTION HIP;  Surgeon: Nicholes Stairs, MD;  Location: [facility];  Service: Orthopedics;  Laterality: Right;  . TONSILLECTOMY    . TOTAL HIP ARTHROPLASTY Right 11/01/2014   Procedure: RIGHT TOTAL HIP ARTHROPLASTY;  Surgeon: Latanya Maudlin, MD;  Location: [facility];  Service: Orthopedics;  Laterality: Right;  . TOTAL HIP ARTHROPLASTY Left 12/08/2017   Procedure: LEFT TOTAL HIP ARTHROPLASTY ANTERIOR APPROACH;  Surgeon: Paralee Cancel, MD;  Location: [facility];  Service: Orthopedics;  Laterality: Left;  70 mins  . TRANSTHORACIC ECHOCARDIOGRAM  08/2014   EF 60-65%, grade I diast dysfxn  . TUBAL LIGATION  1974    Family History  Problem Relation Age of Onset  . Goiter Mother   . Psoriasis Father        od liver  . Cirrhosis Father   . Diabetes Daughter        Type 2  . Stroke Maternal Grandfather   . Stroke Paternal Grandmother   . Hypertension Paternal Grandmother   . Hernia Paternal Grandmother   . Cancer Paternal Grandfather        lung  . Cancer Maternal Aunt        cancer of the stomach     Current Outpatient Medications:  .  albuterol (PROAIR HFA) 108 (90 Base) MCG/ACT inhaler, Inhale 2 puffs into the lungs every  4 (four) hours as needed for wheezing or shortness of breath., Disp: 8.5 g, Rfl: 1 .  albuterol (PROVENTIL) (2.5 MG/3ML) 0.083% nebulizer solution, INHALE 1 VIAL VIA NEBULIZER EVERY 6 HOURS AS NEEDED FOR WHEEZING OR SHORTNESS OF BREATH, Disp: 75 mL, Rfl: 1 .  albuterol (VENTOLIN HFA) 108 (90 Base) MCG/ACT inhaler, Inhale 2 puffs into the lungs every 6 (six) hours as needed for wheezing or shortness of breath., Disp: 8 g, Rfl: 3 .  amLODipine (NORVASC) 5 MG tablet, Take 5 mg by mouth daily., Disp: , Rfl:  .  Artificial Tear Solution (SOOTHE XP) SOLN, Place 1 drop into both eyes 2 (two) times daily as needed (irritation)., Disp: , Rfl:  .  aspirin EC 81 MG tablet, Take 81 mg by mouth daily., Disp: , Rfl:  .  azelastine (ASTELIN) 0.1 % nasal spray, Place 2 sprays into both nostrils 2 (two) times daily. Use in each nostril as directed (Patient taking differently: Place 2 sprays into both nostrils 2 (two) times daily as needed for rhinitis or allergies. Use in each nostril as directed), Disp: 30 mL, Rfl: 3 .  diazepam (VALIUM) 5 MG tablet, TAKE 1 TABLET BY MOUTH EVERY NIGHT AT BEDTIME AND MAY TAKE AN ADDITIONAL TABLET AS NEEDED FOR ANXIETY, Disp: 60 tablet, Rfl: 5 .  diclofenac sodium (VOLTAREN) 1 %  GEL, Apply 4 g topically 4 (four) times daily., Disp: 100 g, Rfl: 1 .  diphenhydrAMINE (BENADRYL) 25 MG tablet, Take 25 mg by mouth 3 (three) times daily as needed for allergies., Disp: , Rfl:  .  docusate sodium (COLACE) 100 MG capsule, Take 100 mg by mouth 2 (two) times daily., Disp: , Rfl:  .  fluorometholone (FML) 0.1 % ophthalmic suspension, Place 1 drop into both eyes as directed. Tapering off, Disp: , Rfl:  .  fluticasone (FLONASE) 50 MCG/ACT nasal spray, Place 1 spray into both nostrils daily. (Patient taking differently: Place 1 spray into both nostrils as needed. ), Disp: 16 g, Rfl: 2 .  Fluticasone-Salmeterol (WIXELA INHUB) 250-50 MCG/DOSE AEPB, INHALE 1 PUFF INTO THE LUNGS TWICE DAILY, Disp: 60 each, Rfl: 5 .  Fluticasone-Umeclidin-Vilant (TRELEGY ELLIPTA) 100-62.5-25 MCG/INH AEPB, Inhale 1 puff into the lungs daily., Disp: 1 each, Rfl: 11 .  lidocaine (LIDODERM) 5 %, Place 1 patch onto the skin daily. Remove & Discard patch within 12 hours or as directed by MD, Disp: 30 patch, Rfl: 0 .  metroNIDAZOLE (FLAGYL) 500 MG tablet, Take 1 tablet (500 mg total) by mouth 2 (two) times daily., Disp: 14 tablet, Rfl: 0 .  montelukast (SINGULAIR) 10 MG tablet, Take 10 mg by mouth at bedtime. , Disp: , Rfl:  .  oxyCODONE (ROXICODONE) 5 MG immediate release tablet, Take 1 tablet (5 mg total) by mouth every 6 (six) hours as needed for up to 15 doses for severe pain., Disp: 30 tablet, Rfl: 0 .  Probiotic Product (PROBIOTIC DAILY PO), Take 1 capsule by mouth daily. , Disp: , Rfl:  .  roflumilast (DALIRESP) 500 MCG TABS tablet, Take 1 tablet (500 mcg total) by mouth daily., Disp: 30 tablet, Rfl: 11 .  SPIRIVA HANDIHALER 18 MCG inhalation capsule, INHALE 1 CAPSULE VIA HANDIHALER EVERY DAY AT THE SAME TIME, Disp: 30 capsule, Rfl: 2 .  SPIRIVA HANDIHALER 18 MCG inhalation capsule, , Disp: , Rfl:  .  SYNTHROID 88 MCG tablet, TAKE 1 TABLET BY MOUTH DAILY, Disp: 30 tablet, Rfl: 5 .  triamcinolone cream (KENALOG)  0.1 %, Apply 1 application topically  2 (two) times daily., Disp: 30 g, Rfl: 0 .  venlafaxine (EFFEXOR) 50 MG tablet, TAKE 1 TABLET BY MOUTH  TWICE DAILY, Disp: 180 tablet, Rfl: 0 .  venlafaxine (EFFEXOR) 50 MG tablet, , Disp: , Rfl:  .  HYDROcodone-acetaminophen (NORCO/VICODIN) 5-325 MG tablet, Take 1-2 tablets by mouth every 6 (six) hours as needed. (Patient not taking: Reported on 09/15/2018), Disp: 15 tablet, Rfl: 0  EXAM:  VITALS per patient if applicable: BP 425/95   Pulse 74   Temp (!) 97.5 F (36.4 C) (Temporal)   Ht 5' (1.524 m)   Wt 134 lb (60.8 kg)   BMI 26.17 kg/m    GENERAL: alert, oriented,  in no acute distress  No further exam b/c telephone visit.  LABS: none today    Chemistry      Component Value Date/Time   NA 142 05/26/2018 0952   K 3.9 05/26/2018 0952   CL 106 05/26/2018 0952   CO2 26 05/26/2018 0952   BUN 16 05/26/2018 0952   CREATININE 0.82 05/26/2018 0952   CREATININE 0.84 02/27/2016 1533      Component Value Date/Time   CALCIUM 9.4 05/26/2018 0952   ALKPHOS 91 05/26/2018 0952   AST 12 05/26/2018 0952   ALT 11 05/26/2018 0952   BILITOT 0.6 05/26/2018 0952     ASSESSMENT AND PLAN:  Discussed the following assessment and plan:  R 7th rib fracture. Pain well controlled with 5mg  oxycodone q6h. Continue current treatments, watch for constipation. Call if more oxycodone is needed. Signs/symptoms to call or return for were reviewed and pt expressed understanding.  I discussed the assessment and treatment plan with the patient. The patient was provided an opportunity to ask questions and all were answered. The patient agreed with the plan and demonstrated an understanding of the instructions.   The patient was advised to call back or seek an in-person evaluation if the symptoms worsen or if the condition fails to improve as anticipated.  Spent 15 min with pt today, with >50% of this time spent in counseling and care coordination regarding the  above problems.  F/u: if not improving.  Signed:  Crissie Sickles, MD           09/15/2018

## 2018-09-16 ENCOUNTER — Other Ambulatory Visit: Payer: Self-pay | Admitting: Pharmacy Technician

## 2018-09-16 NOTE — Patient Outreach (Signed)
[facility]) Care Management  09/16/2018  Priscilla Cantrell January 28, 1936 [id number]    Follow up call placed to [place] regarding patient assistance application(s) for Trelegy and Ventolin HFA , [name] confirms patient has been approved as of 8/11 until 02/03/19. Medication arrive at patients home in 7-10 business days.  Follow up:  Will follow up with patient in 10-14 business days to confirm medication has been received.  Maud Deed Chana Bode [place] Certified Pharmacy Technician [place] Management Direct Dial:[phone]

## 2018-09-21 ENCOUNTER — Other Ambulatory Visit: Payer: Self-pay | Admitting: Family Medicine

## 2018-09-21 ENCOUNTER — Telehealth: Payer: Self-pay | Admitting: Family Medicine

## 2018-09-21 ENCOUNTER — Other Ambulatory Visit: Payer: Self-pay | Admitting: Pharmacy Technician

## 2018-09-21 MED ORDER — OXYCODONE HCL 5 MG PO TABS: 5.0000 mg | ORAL_TABLET | Freq: Four times a day (QID) | ORAL | 0 refills | Status: DC | PRN

## 2018-09-21 NOTE — Telephone Encounter (Signed)
Please advise, thanks.

## 2018-09-21 NOTE — Telephone Encounter (Signed)
Per Priscilla Cantrell, patient needs a follow up visit because her pain is not any better.  Dr. Isla Pence' 1st available appt is on Thursday. Patient states she will be out of pain pills prior to that date and is requesting to come before then.  Please advise. Do we work her or have her come in on Thursday 8/20?  Thank you

## 2018-09-21 NOTE — Telephone Encounter (Signed)
Called patient and lvm to give me a call back. If patients pain has not improved, schedule a VV or office visit. Patients oxycodone was refilled for 30 days on 09/13/18.

## 2018-09-21 NOTE — Telephone Encounter (Signed)
Pt was scheduled for Thursday and was called and given information

## 2018-09-21 NOTE — Telephone Encounter (Signed)
Thursday 8/20 is fine. I will send in more oxycodone pills for her now.

## 2018-09-21 NOTE — Telephone Encounter (Signed)
Medication Refill - Medication: oxyCODONE (ROXICODONE) 5 MG immediate release tablet    Has the patient contacted their pharmacy? Yes.   Pt states she has not been able to have a bowel movement for 2-3 days even though she has been taking suppositories. Please advise.  (Agent: If no, request that the patient contact the pharmacy for the refill.) (Agent: If yes, when and what did the pharmacy advise?)  Preferred Pharmacy (with phone number or street name):  [id number] - [place], [place] - [address] [place] [address]  [address] [place] [address]  Phone: [phone] Fax: [phone]  Not a 24 hour pharmacy; exact hours not known.     Agent: Please be advised that RX refills may take up to 3 business days. We ask that you follow-up with your pharmacy.

## 2018-09-21 NOTE — Patient Outreach (Signed)
[facility]) Care Management  09/21/2018  Priscilla Cantrell 1936/01/23 [id number]    Follow up call placed to [place] regarding patient assistance application(s) for [name] confirms patient has been approved as of 8/18 until 02/03/19. Medication to arrive at patients home in 10-14 business days.  Follow up:  Will follow up with patient in 10-14 business days to verify medication has been received.  Maud Deed Chana Bode [place] Certified Pharmacy Technician [place] Management Direct Dial:[phone]

## 2018-09-21 NOTE — Telephone Encounter (Signed)
Called patient back. Advised patient to make a OV since her pain had not improved. Patient stated that she had not had a bowel movement in 3+ days. Patient stated  She "had to pull out her bowel movement". Patient has used colace and a enema to help relieve some of the pain and to help use the restroom. Patient was sent up front to schedule a visit.

## 2018-09-23 ENCOUNTER — Other Ambulatory Visit: Payer: Self-pay

## 2018-09-23 ENCOUNTER — Encounter: Payer: Self-pay | Admitting: Family Medicine

## 2018-09-23 ENCOUNTER — Ambulatory Visit (INDEPENDENT_AMBULATORY_CARE_PROVIDER_SITE_OTHER): Payer: Medicare Other | Admitting: Family Medicine

## 2018-09-23 VITALS — BP 110/70 | HR 71 | Temp 98.3°F | Resp 17 | Ht 60.0 in | Wt 135.0 lb

## 2018-09-23 DIAGNOSIS — S2231XD Fracture of one rib, right side, subsequent encounter for fracture with routine healing: Secondary | ICD-10-CM

## 2018-09-23 DIAGNOSIS — R0602 Shortness of breath: Secondary | ICD-10-CM | POA: Diagnosis not present

## 2018-09-23 DIAGNOSIS — J9611 Chronic respiratory failure with hypoxia: Secondary | ICD-10-CM

## 2018-09-23 DIAGNOSIS — J449 Chronic obstructive pulmonary disease, unspecified: Secondary | ICD-10-CM | POA: Diagnosis not present

## 2018-09-23 MED ORDER — FUROSEMIDE 20 MG PO TABS: ORAL_TABLET | ORAL | 3 refills | Status: DC

## 2018-09-23 MED ORDER — OXYCODONE HCL 5 MG PO TABS: 5.0000 mg | ORAL_TABLET | Freq: Four times a day (QID) | ORAL | 0 refills | Status: DC | PRN

## 2018-09-23 NOTE — Progress Notes (Signed)
OFFICE VISIT  09/23/2018   CC:  Chief Complaint  Patient presents with  . Chest Pain    pt has a fractured rib. back ride side. pt states she has SOB. has not taken pain medication today. patient brought her oxygen with her. she used her neb once.    HPI:    Patient is a 83 y.o. Caucasian female who presents for ongoing severe pain from right 7th rib fracture sustained 09/05/2018. I last saw her 09/15/18, at which time 5mg  oxycodone several times a day was helfpul.  Interim hx: Still with significant pain R posterolat chest wall.  Hurts bad enough to affect breathing.  Minimal cough.  She is wearing her oxygen 2 L continuously lately b/c of feeling some SOB. Taking 1 oxycodone 5mg  helps SIGNIFICANTLY. Using neb q6h helps.  No fever, no malaise.  Eating/drinking fine.  C/o feeling various sensations in legs like tingling and numbness, says legs feel swollen.  Past Medical History:  Diagnosis Date  . Allergic rhinitis    with upper airway cough: as of 07/2017 pulm f/u she was instructed to use astelin, flonase, and saline more consistently  . BPPV (benign paroxysmal positional vertigo) 03/2018  . Chest pain, non-cardiac    Cardiac CT showed no signif obstructive dz, mildly elevated calcium level (Dr. [name]).  . Chronic cystitis with hematuria    [name]  . Chronic hypoxemic respiratory failure ([place]) 02/02/2015   2L oxygen 24/7  . COPD (chronic obstructive pulmonary disease) (HCC)    GOLD 4.  spirometry 01/10/09 FEV 0.97(52%), FEV1% 47.  With chronic bronchitis as of 07/2017.  . DDD (degenerative disc disease), cervical    1998 MRI C5-6 impingemt (left)  . DDD (degenerative disc disease), lumbar   . Depression with anxiety 04/15/2011  . Diarrheal disease Summer 2017   ? 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).  As of 10/05/15 pt reports GI dx'd her with C diff and  rx'd more flagyl and she is also on cholestyramine.  As of 02/2016, pt's sx's resolved completely.  . Diverticulosis of colon    Procto '97 and colonoscopy 2002  . Dysplastic nevus of upper extremity 04/2014   R tricep ([name])  . Edema of both lower extremities due to peripheral venous insufficiency    Varicosities bilat, swelling L>R.  R baker's cyst.  Got vein clinic eval 02/2018->sclerotherapy recommended but since no hemorrhage or ulceration, insurance will not cover the procedure.  . Family history of adverse reaction to anesthesia    mother died when patient age 26 receiving anesthesia for thyroid surgery  . Fibrocystic breast disease    w/fibroadenoma.  Bx's showed NO atypia (Dr. Margot Chimes).  Mammo neg 2008.  Marland Kitchen GERD (gastroesophageal reflux disease)   . History of double vision    Ophthalmologist, Dr. Herbert Deaner, is further evaluating this with MRI  orbits and limited brain (myesthenia gravis testing neg 07/2010)  . History of home oxygen therapy    uses 2 liters at hs  . Hypertension    EKG 03/2010 normal  . Hypothyroidism    Hashimoto's, dx'd 1989  . Lesion of right native kidney 2017   found on CT 02/2015.  F/u MRI 03/31/15 showed it to be smaller and less likely of any significance but repeat CT renal protocol in 43mo was recommended by radiology.  This was done 10/26/15 and showed no interval change in the lesion, but  b/c the lesion enhances with contrast, radiol rec'd urol consult (b/c pap renal RCC could not be excluded).  Saw urol 03/06/16--stable on u/s 09/2016 and 09/2017.  . Numbness in feet 04/2018   S1 distribution bilat, c/w spinal stenosis sensory neuropathy (no pain).  PN being evaluated with NCS/EMG by Dr. Posey Pronto as of 08/2018.  . Osteoarthritis, hip, bilateral    she is s/p bilat THA as of 02/2018.  Marland Kitchen Pneumonia   . Postmenopausal atrophic vaginitis    improved with estrace 2 X/week.  . Pulmonary nodule    CT chest 12/10, June 2011.  No nodule on CT 06/2010.  Marland Kitchen Recurrent  Clostridium difficile diarrhea    Episode 09/19/2016: resolved with prolonged course of flagyl.  11/2016 recurrence treated with 10d of Dificid (Fidaxomicin) by GI (Dr. Earlean Shawl).  . Recurrent UTI    likely related to pt's atrophic vaginitis.  Urol started vaginal estrace 03/2016.  . Rib fracture 09/2018   R 7th, laterally-->sustained when her dog pulled her over and she fell.  . Shingles 02/2014   R abd/flank  . Spinal stenosis of lumbar region with neurogenic claudication    2008 MRI (L3-4, L4-5, L5-S1).  +S1 distribution sensory loss bilat    Past Surgical History:  Procedure Laterality Date  . ABDOMINAL HYSTERECTOMY  1978   no BSO per pt--nonmalignant reasons  . APPENDECTOMY    . BREAST CYST EXCISION     Last screening mammogram 10/2010 was normal.  . CHOLECYSTECTOMY  2001  . COLONOSCOPY  04/2005; 12/06/2015   2007 NORMAL.  2017 adenomatous polyp x 1.  Mod sigmoid diverticulosis (Dr. Earlean Shawl).    Marland Kitchen DEXA  05/18/2017   NORMAL (T-score 0.1)  . HIP CLOSED REDUCTION Right 12/25/2015   Procedure: CLOSED REDUCTION HIP;  Surgeon: Nicholes Stairs, MD;  Location: [facility];  Service: Orthopedics;  Laterality: Right;  . TONSILLECTOMY    . TOTAL HIP ARTHROPLASTY Right 11/01/2014   Procedure: RIGHT TOTAL HIP ARTHROPLASTY;  Surgeon: Latanya Maudlin, MD;  Location: [facility];  Service: Orthopedics;  Laterality: Right;  . TOTAL HIP ARTHROPLASTY Left 12/08/2017   Procedure: LEFT TOTAL HIP ARTHROPLASTY ANTERIOR APPROACH;  Surgeon: Paralee Cancel, MD;  Location: [facility];  Service: Orthopedics;  Laterality: Left;  70 mins  . TRANSTHORACIC ECHOCARDIOGRAM  08/2014   EF 60-65%, grade I diast dysfxn  . TUBAL LIGATION  1974    Outpatient Medications Prior to Visit  Medication Sig Dispense Refill  . albuterol (PROAIR HFA) 108 (90 Base) MCG/ACT inhaler Inhale 2 puffs into the lungs every 4 (four) hours as needed for wheezing or shortness of breath. 8.5 g 1  . albuterol (PROVENTIL) (2.5 MG/3ML) 0.083% nebulizer  solution INHALE 1 VIAL VIA NEBULIZER EVERY 6 HOURS AS NEEDED FOR WHEEZING OR SHORTNESS OF BREATH 75 mL 1  . albuterol (VENTOLIN HFA) 108 (90 Base) MCG/ACT inhaler Inhale 2 puffs into the lungs every 6 (six) hours as needed for wheezing or shortness of breath. 8 g 3  . amLODipine (NORVASC) 5 MG tablet Take 5 mg by mouth daily.    . Artificial Tear Solution (SOOTHE XP) SOLN Place 1 drop into both eyes 2 (two) times daily as needed (irritation).    Marland Kitchen aspirin EC 81 MG tablet Take 81 mg by mouth daily.    Marland Kitchen azelastine (ASTELIN) 0.1 % nasal spray Place 2 sprays into both nostrils 2 (two) times daily. Use in each nostril as directed (Patient taking differently: Place 2 sprays into both nostrils 2 (two)  times daily as needed for rhinitis or allergies. Use in each nostril as directed) 30 mL 3  . diazepam (VALIUM) 5 MG tablet TAKE 1 TABLET BY MOUTH EVERY NIGHT AT BEDTIME AND MAY TAKE AN ADDITIONAL TABLET AS NEEDED FOR ANXIETY 60 tablet 5  . diclofenac sodium (VOLTAREN) 1 % GEL Apply 4 g topically 4 (four) times daily. 100 g 1  . diphenhydrAMINE (BENADRYL) 25 MG tablet Take 25 mg by mouth 3 (three) times daily as needed for allergies.    Marland Kitchen docusate sodium (COLACE) 100 MG capsule Take 100 mg by mouth 2 (two) times daily.    . fluorometholone (FML) 0.1 % ophthalmic suspension Place 1 drop into both eyes as directed. Tapering off    . fluticasone (FLONASE) 50 MCG/ACT nasal spray Place 1 spray into both nostrils daily. (Patient taking differently: Place 1 spray into both nostrils as needed. ) 16 g 2  . Fluticasone-Salmeterol (WIXELA INHUB) 250-50 MCG/DOSE AEPB INHALE 1 PUFF INTO THE LUNGS TWICE DAILY 60 each 5  . Fluticasone-Umeclidin-Vilant (TRELEGY ELLIPTA) 100-62.5-25 MCG/INH AEPB Inhale 1 puff into the lungs daily. 1 each 11  . lidocaine (LIDODERM) 5 % Place 1 patch onto the skin daily. Remove & Discard patch within 12 hours or as directed by MD 30 patch 0  . montelukast (SINGULAIR) 10 MG tablet Take 10 mg by  mouth at bedtime.     . Probiotic Product (PROBIOTIC DAILY PO) Take 1 capsule by mouth daily.     . roflumilast (DALIRESP) 500 MCG TABS tablet Take 1 tablet (500 mcg total) by mouth daily. 30 tablet 11  . SPIRIVA HANDIHALER 18 MCG inhalation capsule INHALE 1 CAPSULE VIA HANDIHALER EVERY DAY AT THE SAME TIME 30 capsule 2  . SPIRIVA HANDIHALER 18 MCG inhalation capsule     . SYNTHROID 88 MCG tablet TAKE 1 TABLET BY MOUTH DAILY 30 tablet 5  . triamcinolone cream (KENALOG) 0.1 % Apply 1 application topically 2 (two) times daily. 30 g 0  . venlafaxine (EFFEXOR) 50 MG tablet TAKE 1 TABLET BY MOUTH  TWICE DAILY 180 tablet 0  . venlafaxine (EFFEXOR) 50 MG tablet     . oxyCODONE (ROXICODONE) 5 MG immediate release tablet Take 1 tablet (5 mg total) by mouth every 6 (six) hours as needed for up to 15 doses for severe pain. 30 tablet 0  . HYDROcodone-acetaminophen (NORCO/VICODIN) 5-325 MG tablet Take 1-2 tablets by mouth every 6 (six) hours as needed. (Patient not taking: Reported on 09/15/2018) 15 tablet 0  . metroNIDAZOLE (FLAGYL) 500 MG tablet Take 1 tablet (500 mg total) by mouth 2 (two) times daily. (Patient not taking: Reported on 09/23/2018) 14 tablet 0   No facility-administered medications prior to visit.     Allergies  Allergen Reactions  . Losartan Other (See Comments)    hyperkalemia  . Penicillins Hives, Swelling and Rash    Has patient had a PCN reaction causing immediate rash, facial/tongue/throat swelling, SOB or lightheadedness with hypotension: YES Has patient had a PCN reaction causing severe rash involving mucus membranes or skin necrosis: NO Has patient had a PCN reaction that required hospitalization NO Has patient had a PCN reaction occurring within the last 10 years: NO If all of the above answers are "NO", then may proceed with Cephalosporin use.   . Cefixime [Kdc:Cefixime] Other (See Comments)    unspecified  . Indocin [Indomethacin] Other (See Comments)    Painful tongue  and throat  . Singulair [Montelukast Sodium]  Chest tightness   . Sulfa Antibiotics Other (See Comments)    unspecified  . Ace Inhibitors Other (See Comments)    cough  . Statins Other (See Comments)    Myalgias     ROS As per HPI  PE: Blood pressure 110/70, pulse 71, temperature 98.3 F (36.8 C), temperature source Temporal, resp. rate 17, height 5' (1.524 m), weight 135 lb (61.2 kg), SpO2 96 %.3L oxygen [place] Gen: Alert, well appearing.  Patient is oriented to person, place, time, and situation. AFFECT: pleasant, lucid thought and speech. BBC:WUGQ: no injection, icteris, swelling, or exudate.  EOMI, PERRLA. Mouth: lips without lesion/swelling.  Oral mucosa pink and moist. Oropharynx without erythema, exudate, or swelling.  CV: RRR, no m/r/g.   LUNGS: CTA bilat, nonlabored resps, good aeration in all lung fields. Signif TTP R posterolateral CW focally in area of 7th rib EXT: no clubbing or cyanosis.  She has 2-3+ pitting edema in both LL's.    LABS:  Lab Results  Component Value Date   WBC 8.9 05/26/2018   HGB 13.7 05/26/2018   HCT 42.0 05/26/2018   MCV 86.9 05/26/2018   PLT 269.0 05/26/2018     Chemistry      Component Value Date/Time   NA 142 05/26/2018 0952   K 3.9 05/26/2018 0952   CL 106 05/26/2018 0952   CO2 26 05/26/2018 0952   BUN 16 05/26/2018 0952   CREATININE 0.82 05/26/2018 0952   CREATININE 0.84 02/27/2016 1533      Component Value Date/Time   CALCIUM 9.4 05/26/2018 0952   ALKPHOS 91 05/26/2018 0952   AST 12 05/26/2018 0952   ALT 11 05/26/2018 0952   BILITOT 0.6 05/26/2018 0952      IMPRESSION AND PLAN:  1) Right 7th rib fx, with alteration of respirations due to pain. She has been to ED once since the fracture for feeling SOB and CXR showed no acute abnormalities. Lung exam normal today.  Feel like she may be having some atelectasis but low suspicion of pneumonia or PE. She has chronic hypoxic RF from her COPD at baseline. Continue oxygen,  check CXR, continue oxycodone 5 mg q6h prn. I sent in a rx for this, #40, for pharmacy to hold and fill if she should need more. Signs/symptoms to call or return for were reviewed and pt expressed understanding.  An After Visit Summary was printed and given to the patient.  FOLLOW UP: Return if symptoms worsen or fail to improve.  Signed:  Crissie Sickles, MD           09/23/2018

## 2018-09-23 NOTE — Patient Outreach (Signed)
[place] [facility]) Care Management  09/23/2018  Priscilla Cantrell 22-Sep-1935 [id number]    1 unsuccessful call to the patient.  No answer.  HIPAA compliant voicemail left with contact information.   Plan: RN Health Coach will send letter. [place] will make outreach attempt to the patient within thirty business days.   Lazaro Arms RN, BSN, [place] Direct Dial:  [phone]  Fax: [phone]

## 2018-09-24 ENCOUNTER — Telehealth: Payer: Self-pay | Admitting: Pulmonary Disease

## 2018-09-24 ENCOUNTER — Other Ambulatory Visit: Payer: Self-pay

## 2018-09-24 NOTE — Telephone Encounter (Signed)
Spoke with pt advised her that she should be taking Trelegy and Daliresp only. She does not need to take wilexa or spiriva because Trelegy has all three medications in one inhaler. Pt understood and nothing further is needed.

## 2018-09-24 NOTE — Patient Outreach (Signed)
[facility]) Care Management  09/24/2018   Priscilla Cantrell Jul 19, 1935 [id number]  Subjective: Received return call from the patient.  Two patient identifiers given.  The patient states that she is making it.  She denies wheezing , cough,  or falls.  She states she still is having pain that she rates at a 5/10 without medication.  She has some shortness of breath usually but more now with her broken rib.  She went to an appointment with her primary care yesterday and told that her lungs are clear.  As a preventative measure the patient states she is scheduled to have an xray.  She is taking her medications.  She reports receiving her Trelegy and Daliresp. Advised her I will call and notified Etter Sjogren CPhT that medications have been received. She reports that she has some swelling in her ankles and feet. She did talk with her physician about it.  She is resting staying off her legs and elevating them.  The patient has an appointment on 10/06/18 for follow up.  Current Medications:  Current Outpatient Medications  Medication Sig Dispense Refill  . albuterol (PROAIR HFA) 108 (90 Base) MCG/ACT inhaler Inhale 2 puffs into the lungs every 4 (four) hours as needed for wheezing or shortness of breath. 8.5 g 1  . albuterol (PROVENTIL) (2.5 MG/3ML) 0.083% nebulizer solution INHALE 1 VIAL VIA NEBULIZER EVERY 6 HOURS AS NEEDED FOR WHEEZING OR SHORTNESS OF BREATH 75 mL 1  . albuterol (VENTOLIN HFA) 108 (90 Base) MCG/ACT inhaler Inhale 2 puffs into the lungs every 6 (six) hours as needed for wheezing or shortness of breath. 8 g 3  . amLODipine (NORVASC) 5 MG tablet Take 5 mg by mouth daily.    . Artificial Tear Solution (SOOTHE XP) SOLN Place 1 drop into both eyes 2 (two) times daily as needed (irritation).    Marland Kitchen aspirin EC 81 MG tablet Take 81 mg by mouth daily.    Marland Kitchen azelastine (ASTELIN) 0.1 % nasal spray Place 2 sprays into both nostrils 2 (two) times daily. Use in each nostril as directed  (Patient taking differently: Place 2 sprays into both nostrils 2 (two) times daily as needed for rhinitis or allergies. Use in each nostril as directed) 30 mL 3  . diazepam (VALIUM) 5 MG tablet TAKE 1 TABLET BY MOUTH EVERY NIGHT AT BEDTIME AND MAY TAKE AN ADDITIONAL TABLET AS NEEDED FOR ANXIETY 60 tablet 5  . diclofenac sodium (VOLTAREN) 1 % GEL Apply 4 g topically 4 (four) times daily. 100 g 1  . diphenhydrAMINE (BENADRYL) 25 MG tablet Take 25 mg by mouth 3 (three) times daily as needed for allergies.    Marland Kitchen docusate sodium (COLACE) 100 MG capsule Take 100 mg by mouth 2 (two) times daily.    . fluorometholone (FML) 0.1 % ophthalmic suspension Place 1 drop into both eyes as directed. Tapering off    . fluticasone (FLONASE) 50 MCG/ACT nasal spray Place 1 spray into both nostrils daily. (Patient taking differently: Place 1 spray into both nostrils as needed. ) 16 g 2  . Fluticasone-Salmeterol (WIXELA INHUB) 250-50 MCG/DOSE AEPB INHALE 1 PUFF INTO THE LUNGS TWICE DAILY 60 each 5  . Fluticasone-Umeclidin-Vilant (TRELEGY ELLIPTA) 100-62.5-25 MCG/INH AEPB Inhale 1 puff into the lungs daily. 1 each 11  . furosemide (LASIX) 20 MG tablet 1 tab po qd prn increased fluid collection in legs 30 tablet 3  . HYDROcodone-acetaminophen (NORCO/VICODIN) 5-325 MG tablet Take 1-2 tablets by mouth every 6 (six)  hours as needed. 15 tablet 0  . lidocaine (LIDODERM) 5 % Place 1 patch onto the skin daily. Remove & Discard patch within 12 hours or as directed by MD 30 patch 0  . montelukast (SINGULAIR) 10 MG tablet Take 10 mg by mouth at bedtime.     Marland Kitchen oxyCODONE (ROXICODONE) 5 MG immediate release tablet Take 1 tablet (5 mg total) by mouth every 6 (six) hours as needed for up to 15 doses for severe pain. 40 tablet 0  . Probiotic Product (PROBIOTIC DAILY PO) Take 1 capsule by mouth daily.     . roflumilast (DALIRESP) 500 MCG TABS tablet Take 1 tablet (500 mcg total) by mouth daily. 30 tablet 11  . SPIRIVA HANDIHALER 18 MCG  inhalation capsule INHALE 1 CAPSULE VIA HANDIHALER EVERY DAY AT THE SAME TIME 30 capsule 2  . SPIRIVA HANDIHALER 18 MCG inhalation capsule     . SYNTHROID 88 MCG tablet TAKE 1 TABLET BY MOUTH DAILY 30 tablet 5  . triamcinolone cream (KENALOG) 0.1 % Apply 1 application topically 2 (two) times daily. 30 g 0  . venlafaxine (EFFEXOR) 50 MG tablet TAKE 1 TABLET BY MOUTH  TWICE DAILY 180 tablet 0  . venlafaxine (EFFEXOR) 50 MG tablet      No current facility-administered medications for this visit.     Functional Status:  In your present state of health, do you have any difficulty performing the following activities: 08/20/2018 12/08/2017  Hearing? N N  Vision? N N  Difficulty concentrating or making decisions? N N  Walking or climbing stairs? N Y  Dressing or bathing? N N  Doing errands, shopping? N N  Some recent data might be hidden    Fall/Depression Screening: Fall Risk  09/24/2018 09/09/2018 09/01/2018  Falls in the past year? 0 1 0  Number falls in past yr: - 0 0  Injury with Fall? - 1 0  Comment - patient fell and broke her right rib.  She was seen at the hospital on 8/2 -  Risk for fall due to : - Other (Comment) -  Risk for fall due to: Comment - Her dog ran to chase a cat and pulled he as she was going down some steps and there is no railing. -  Follow up - Falls prevention discussed -  Comment - Patient states that she is going to call a repair professional to look at the area and see what could be a solution for her. -   PHQ 2/9 Scores 08/20/2018 08/02/2018 08/02/2018 09/04/2017 11/18/2016 10/13/2016 10/13/2016  PHQ - 2 Score 0 0 0 0 0 0 0  PHQ- 9 Score - - - 0 0 1 -    Assessment: Patient will continue to benefit from health coach outreach for disease management and support. [facility]N CM Care Plan Problem One     Most Recent Value  THN Long Term Goal   In 90 days the patient will verbalize no admission to the hospital for COPD exacerbation  THN Long Term Goal Start Date  09/24/18   Interventions for Problem One Long Term Goal  Discussed signs and symptoms of COPD, encouraged medication adherence, encouraged the patient to discuss the continued use of wixela with her physician,,encouraged continued elevation of her legs for swelling     Plan: [place] will contact patient in the month of September and patient agrees to next outreach. Will follow up next month to check on patients progress.  Lazaro Arms RN, BSN, [facility]  [place] Direct Dial:  [phone]  Fax: [phone]

## 2018-09-24 NOTE — Progress Notes (Signed)
Reviewed and agree with assessment/plan.   Kaileen Bronkema, MD [place] [facility] 01/30/2016, 12:24 PM Pager:  [phone]  

## 2018-09-25 ENCOUNTER — Other Ambulatory Visit: Payer: Self-pay

## 2018-09-25 ENCOUNTER — Ambulatory Visit (HOSPITAL_BASED_OUTPATIENT_CLINIC_OR_DEPARTMENT_OTHER)
Admission: RE | Admit: 2018-09-25 | Discharge: 2018-09-25 | Disposition: A | Discharge status: Home / Self Care | Payer: Medicare Other | Source: Ambulatory Visit | Attending: Family Medicine | Admitting: Family Medicine

## 2018-09-25 DIAGNOSIS — J449 Chronic obstructive pulmonary disease, unspecified: Secondary | ICD-10-CM | POA: Diagnosis not present

## 2018-09-25 DIAGNOSIS — R0602 Shortness of breath: Secondary | ICD-10-CM | POA: Diagnosis not present

## 2018-09-25 DIAGNOSIS — S2231XD Fracture of one rib, right side, subsequent encounter for fracture with routine healing: Secondary | ICD-10-CM | POA: Diagnosis not present

## 2018-09-25 DIAGNOSIS — J9611 Chronic respiratory failure with hypoxia: Secondary | ICD-10-CM | POA: Insufficient documentation

## 2018-09-27 ENCOUNTER — Other Ambulatory Visit: Payer: Self-pay | Admitting: Pharmacy Technician

## 2018-09-27 ENCOUNTER — Other Ambulatory Visit: Payer: Self-pay | Admitting: Pharmacist

## 2018-09-27 ENCOUNTER — Other Ambulatory Visit: Payer: Self-pay | Admitting: Family Medicine

## 2018-09-27 NOTE — Patient Outreach (Signed)
[place] [facility]) Care Management  09/27/2018  Priscilla Cantrell May 01, 1935 [id number]    Successful call placed to patient regarding patient assistance medication delivery of Daliresp, Ventolin HFA and Trelegy, HIPAA identifiers verified. Ms, [name] confirms receiving 3 months supply of all 3 meds. Reviewed with her on obtaining refills and requested she contact me if she has any issues. [name] states that she has not additional questions.  Follow up:  Will route note to [place] for case closure  Maud Deed. Chana Bode [place] Certified Pharmacy Technician [place] Management Direct Dial:[phone]

## 2018-09-27 NOTE — Patient Outreach (Signed)
[facility]) Care Management [place]  09/27/2018  SYERRA ABDELRAHMAN [birth year] [id number]  Patient has been approved for Daliresp, Ventolin, and Trelegy patient assistance program(s).  Patient has been instructed on how to order refills and renewal process for 2020.    Plan: [place] case is being closed due to the following reasons: -Goals of care have been met. -Thank you for allowing [facility] pharmacy to be involved in this patient's care.    Ralene Bathe, PharmD, [place] [phone]

## 2018-09-28 ENCOUNTER — Ambulatory Visit: Payer: Medicare Other | Admitting: Gynecology

## 2018-10-01 DIAGNOSIS — L500 Allergic urticaria: Secondary | ICD-10-CM | POA: Diagnosis not present

## 2018-10-03 ENCOUNTER — Telehealth: Payer: Self-pay | Admitting: Internal Medicine

## 2018-10-03 NOTE — Telephone Encounter (Signed)
Breathing trouble insidious onset for past several weeks since breaking rib. Pulse ox okay No wheezing/cough Sleeping poorly Getting stressed regarding lack of improvement  Sent message to triage pool requesting appt Monday with CXR

## 2018-10-04 ENCOUNTER — Telehealth: Payer: Self-pay | Admitting: Family Medicine

## 2018-10-04 ENCOUNTER — Other Ambulatory Visit: Payer: Self-pay | Admitting: Family Medicine

## 2018-10-04 ENCOUNTER — Telehealth: Payer: Self-pay | Admitting: *Deleted

## 2018-10-04 ENCOUNTER — Telehealth: Payer: Self-pay | Admitting: Pulmonary Disease

## 2018-10-04 DIAGNOSIS — J449 Chronic obstructive pulmonary disease, unspecified: Secondary | ICD-10-CM

## 2018-10-04 DIAGNOSIS — R0781 Pleurodynia: Secondary | ICD-10-CM

## 2018-10-04 NOTE — Telephone Encounter (Signed)
New neb has been ordered.  Pt aware.  Nothing further needed at this time- will close encounter.

## 2018-10-04 NOTE — Telephone Encounter (Signed)
RF request for amlodipine 5mg - I see this on current med list but not RX'd by Dr Anitra Lauth per med list.    As of [place] 05/24/18, it looks like patient was switched to amlodipine 10mg .  Please advise.

## 2018-10-04 NOTE — Telephone Encounter (Signed)
[name] nebulizer is broken. She broke a rib and is having trouble breathing. Patient would like to get it today. Patient has been advised Dr. Anitra Lauth is out of the office. She would like to get it from [facility].

## 2018-10-04 NOTE — Telephone Encounter (Signed)
Called and spoke to patient. She is able to come in for a chest xray tomorrow 10/05/18 with an appt to follow with Wyn Quaker FNP at 0930.    Fractured rib (8 weeks ago)  on right side and is causing her pain (example was scrambling egg) but at night she can't sleep.   Per previous note DS wanted patient to have xray and follow up appt , both scheduled and ordered.

## 2018-10-04 NOTE — Telephone Encounter (Signed)
Okay to send order for new home nebulizer machine.

## 2018-10-04 NOTE — Telephone Encounter (Signed)
Pt was called and told to call Dr [name] office to get neb RX.Marland Kitchen Pt had called Dr [name] office yesterday to report breathing difficulties and sleeping poorly. Pt was advised to call back to pulm after we hung up, she agreed

## 2018-10-04 NOTE — Telephone Encounter (Signed)
-----   Message from [name], MD sent at 10/03/2018  7:05 AM EDT ----- Regarding: Appt tomorrow Worsening breathing for past few weeks since breaking rib. See if we can get her seen by midlevel with CXR for Monday and call her with appt time Priscilla Cantrell

## 2018-10-04 NOTE — Progress Notes (Signed)
@Patient  ID: Priscilla Cantrell, female    DOB: 1935/12/10, 83 y.o.   MRN: [id number]  Chief Complaint  Patient presents with  . Follow-up    Breathing has gotten worse since rib injury.     Referring provider: [name], MD  HPI:  83 year old female former smoker followed in our office for GOLD III COPD (based on: Spirometry 05/14/17 >> FEV1 0.91 (55%), FEV1% 48), nocturnal hypoxemia  PMH: GERD, hip pain, osteoarthritis Smoker/ Smoking History: Former smoker.  Quit in 1992.  70 pack years. Maintenance: Trelegy Ellipta, Daliresp  [name]  10/05/2018  - Visit   83 year old female former smoker followed in our office for COPD Gold 3.  Patient presenting to our office today as she is had worsening shortness of breath since falling at home on 09/08/2018.  Patient presented to the emergency room at that time and was found to have right sided rib fractures.  Patient had a chest x-ray performed prior to this office visit today also with right-sided rib fractures.  Patient reports that prior to her fall on 09/08/2018 she had been doing quite well since being transitioned from [employer] and [employer] to [employer].  Patient reports that since transitioning to Trelegy Ellipta since qualifying for [employer] for you coverage and received this medication for free she felt that her breathing had been improved.  Unfortunately shortness of breath is worsened since 09/08/2018 fall.  Patient reports she is having use her rescue inhaler more often averaging about 2-3 times a day.  This also includes nebulizer use.   Tests:   10/12/2017-chest x-ray-stable chronic lung disease no acute findings with bronchitis, allergic rhinitis, nocturnal hypoxemia  09/08/2018-chest x-ray-right lateral seventh rib fracture without pneumothorax  09/25/2018-chest x-ray- cardiomegaly, and severe venous change, probable small right pleural effusion and/or pleural thickening unchanged from prior, redemonstrated subacute to chronic  fractures of lateral right ribs no pneumothorax  10/05/2018-chest x-ray- no active cardiopulmonary disease, no evidence of pneumonia or pulmonary edema, hyperexpanded lungs indicating COPD, subacute to chronic right-sided rib fractures as previously described  Pulmonary tests Spirometry 01/10/09>>FEV1 0.97(52%), FEV1% 47 March 2012>>Daliresp started RAST 05/04/17 >> negative, IgE 8 Spirometry 05/14/17 >> FEV1 0.91 (55%), FEV1% 48  Sleep tests PSG 11/14/11 >> AHI 0.3 ONO with RA 07/31/16 >> test time 6 hrs 41 min.  Average SpO2 87%, low SpO2 74%.  Spent 4 hrs 13 min with SpO2 < 88%.  Cardiac tests Echo 08/30/14 >> EF 60 to [id number], grade 1 diastolic dysfx, PAS 24 mmHG  SIX MIN WALK 07/29/2018 07/29/2016 02/08/2015 11/17/2014  Supplimental Oxygen during Test? (L/min) - No No No  Tech Comments: Pt walked at a good pace. Pt dropped at 2nd lap and more on 3rd lap. O2 appilied after 3rd lap, walked 1 lap at 2 liters, maintained O2 sat above 92. pt walked at a slow pace - no desat - no c/o SOB, wheezing or chest tx--amg At end of 1st lap, o2 sat 88% on RA with pulse of 96.  pt placed on 2lpm continuous o2 with resting o2 sat of 98% and pulse of 76.  After ambulating 1 lap on 2lpm cont o2, o2 sat 91% with pulse of 97.  Lowest o2 sat during this lap was 91% on 2lpm.  Walk stopped after 1 lap on o2 per pt's request d/t weakness from recent hip surgery.   pt unable to complete test d/t recent hip replacement     FENO:  No results found for:  NITRICOXIDE  PFT: No flowsheet data found.  Imaging: Dg Chest 2 View  Result Date: 10/05/2018 CLINICAL DATA:  Difficulty breathing with RIGHT-sided rib pain. History of hypertension, COPD, recent fall. EXAM: CHEST - 2 VIEW COMPARISON:  Chest x-rays dated 09/25/2018 and 02/01/2018. FINDINGS: Heart size and mediastinal contours are stable. Lungs are hyperexpanded. Chronic bronchitic changes noted centrally. Coarse lung markings bilaterally suggests some degree of chronic  interstitial lung disease. No confluent opacity to suggest a developing pneumonia. No pleural effusion or pneumothorax seen. No acute or suspicious osseous finding. IMPRESSION: 1. No active cardiopulmonary disease. No evidence of pneumonia or pulmonary edema. 2. Hyperexpanded lungs indicating COPD. Associated chronic bronchitic changes and probable chronic interstitial lung disease. 3. Subacute to chronic RIGHT-sided rib fractures, as previously described. No new osseous abnormality. Electronically Signed   By: Franki Cabot M.D.   On: 10/05/2018 09:35   Dg Chest 2 View  Result Date: 09/25/2018 CLINICAL DATA:  Right seventh rib fracture, COPD, oxygen, increased shortness of breath EXAM: CHEST - 2 VIEW COMPARISON:  09/08/2018 FINDINGS: Cardiomegaly. Emphysematous change. Probable small right pleural effusion and/or pleural thickening unchanged from prior. Redemonstrated subacute to chronic fractures of the lateral right ribs. No pneumothorax. IMPRESSION: Cardiomegaly. Emphysematous change. Probable small right pleural effusion and/or pleural thickening unchanged from prior. Redemonstrated subacute to chronic fractures of the lateral right ribs. No pneumothorax. Electronically Signed   By: Eddie Candle M.D.   On: 09/25/2018 14:32   Dg Chest 2 View  Result Date: 09/08/2018 CLINICAL DATA:  Shortness of breath, chest wall pain EXAM: CHEST - 2 VIEW COMPARISON:  09/05/2018 FINDINGS: Stable cardiomediastinal contours. Calcific aortic knob. Lungs are clear without focal airspace consolidation, pleural effusion, or pneumothorax. Redemonstrated mildly displaced lateral right seventh rib fracture. IMPRESSION: Right lateral seventh rib fracture without pneumothorax. Electronically Signed   By: Davina Poke M.D.   On: 09/08/2018 16:28   Dg Chest 2 View  Result Date: 09/05/2018 CLINICAL DATA:  Fall 2 hours ago.  Right chest pain. EXAM: CHEST - 2 VIEW COMPARISON:  02/01/2018 FINDINGS: Normal heart size. Aortic  atherosclerosis. Lungs are clear. No pleural effusion or edema. Acute mildly displaced fracture deformity involves the lateral aspect of the seventh rib. Degenerative changes are noted within both glenohumeral joints and within the thoracic spine. IMPRESSION: 1. Acute right seventh rib fracture. Electronically Signed   By: Kerby Moors M.D.   On: 09/05/2018 18:29   Dg Elbow Complete Right  Result Date: 09/05/2018 CLINICAL DATA:  Fall today.  Right posterior elbow pain and swelling EXAM: RIGHT ELBOW - COMPLETE 3+ VIEW COMPARISON:  None FINDINGS: There is no evidence of fracture, dislocation, or joint effusion. There is no evidence of arthropathy or other focal bone abnormality. Soft tissues are unremarkable. IMPRESSION: Negative. Electronically Signed   By: Kerby Moors M.D.   On: 09/05/2018 18:31      Specialty Problems      Pulmonary Problems   Allergic rhinitis    Qualifier: Diagnosis of  By: [name] MD, [name]        COPD GOLD III B    Spirometry 01/10/09>>FEV1 0.97(52%), FEV1% 47 March 2012>>Daliresp started RAST 05/04/17 >> negative, IgE 8 Spirometry 05/14/17 >> FEV1 0.91 (55%), FEV1% 48       Hypoxia   SOB (shortness of breath)   Chronic hypoxemic respiratory failure (HCC)    ONO with RA 07/31/16 >> test time 6 hrs 41 min.  Average SpO2 87%, low SpO2 74%.  Spent 4 hrs 13  min with SpO2 < 88%.         Allergies  Allergen Reactions  . Losartan Other (See Comments)    hyperkalemia  . Penicillins Hives, Swelling and Rash    Has patient had a PCN reaction causing immediate rash, facial/tongue/throat swelling, SOB or lightheadedness with hypotension: YES Has patient had a PCN reaction causing severe rash involving mucus membranes or skin necrosis: NO Has patient had a PCN reaction that required hospitalization NO Has patient had a PCN reaction occurring within the last 10 years: NO If all of the above answers are "NO", then may proceed with Cephalosporin use.   . Cefixime  [Kdc:Cefixime] Other (See Comments)    unspecified  . Indocin [Indomethacin] Other (See Comments)    Painful tongue and throat  . Singulair [Montelukast Sodium]     Chest tightness   . Sulfa Antibiotics Other (See Comments)    unspecified  . Ace Inhibitors Other (See Comments)    cough  . Statins Other (See Comments)    Myalgias     Immunization History  Administered Date(s) Administered  . Fluad Quad(high Dose 65+) 10/05/2018  . Influenza Split 11/04/2010, 10/23/2011  . Influenza Whole 10/04/2009  . Influenza, High Dose Seasonal PF 11/07/2016, 11/04/2017  . Influenza,inj,Quad PF,6+ Mos 10/07/2012, 10/11/2014  . Influenza-Unspecified 11/07/2013, 11/01/2015  . Pneumococcal Conjugate-13 04/29/2013  . Pneumococcal Polysaccharide-23 02/03/2005, 11/28/2010  . Td 02/04/2008, 03/07/2010  . Zoster 02/10/2013  . Zoster Recombinat (Shingrix) 10/30/2016, 12/29/2016    Past Medical History:  Diagnosis Date  . Allergic rhinitis    with upper airway cough: as of 07/2017 pulm f/u she was instructed to use astelin, flonase, and saline more consistently  . BPPV (benign paroxysmal positional vertigo) 03/2018  . Chest pain, non-cardiac    Cardiac CT showed no signif obstructive dz, mildly elevated calcium level (Dr. [name]).  . Chronic cystitis with hematuria    [name]  . Chronic hypoxemic respiratory failure ([place]) 02/02/2015   2L oxygen 24/7  . COPD (chronic obstructive pulmonary disease) (HCC)    GOLD 4.  spirometry 01/10/09 FEV 0.97(52%), FEV1% 47.  With chronic bronchitis as of 07/2017.  . DDD (degenerative disc disease), cervical    1998 MRI C5-6 impingemt (left)  . DDD (degenerative disc disease), lumbar   . Depression with anxiety 04/15/2011  . Diarrheal disease Summer 2017   ? 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).  As of 10/05/15 pt reports GI dx'd her with C  diff and rx'd more flagyl and she is also on cholestyramine.  As of 02/2016, pt's sx's resolved completely.  . Diverticulosis of colon    Procto '97 and colonoscopy 2002  . Dysplastic nevus of upper extremity 04/2014   R tricep ([name])  . Edema of both lower extremities due to peripheral venous insufficiency    Varicosities bilat, swelling L>R.  R baker's cyst.  Got vein clinic eval 02/2018->sclerotherapy recommended but since no hemorrhage or ulceration, insurance will not cover the procedure.  . Family history of adverse reaction to anesthesia    mother died when patient age 65 receiving anesthesia for thyroid surgery  . Fibrocystic breast disease    w/fibroadenoma.  Bx's showed NO atypia (Dr. Margot Chimes).  Mammo neg 2008.  Marland Kitchen GERD (gastroesophageal reflux disease)   . History of double vision    Ophthalmologist, Dr. Herbert Deaner, is further evaluating this with MRI  orbits and limited  brain (myesthenia gravis testing neg 07/2010)  . History of home oxygen therapy    uses 2 liters at hs  . Hypertension    EKG 03/2010 normal  . Hypothyroidism    Hashimoto's, dx'd 1989  . Lesion of right native kidney 2017   found on CT 02/2015.  F/u MRI 03/31/15 showed it to be smaller and less likely of any significance but repeat CT renal protocol in 31mo was recommended by radiology.  This was done 10/26/15 and showed no interval change in the lesion, but b/c the lesion enhances with contrast, radiol rec'd urol consult (b/c pap renal RCC could not be excluded).  Saw urol 03/06/16--stable on u/s 09/2016 and 09/2017.  . Numbness in feet 04/2018   S1 distribution bilat, c/w spinal stenosis sensory neuropathy (no pain).  PN being evaluated with NCS/EMG by Dr. Posey Pronto as of 08/2018.  . Osteoarthritis, hip, bilateral    she is s/p bilat THA as of 02/2018.  Marland Kitchen Pneumonia   . Postmenopausal atrophic vaginitis    improved with estrace 2 X/week.  . Pulmonary nodule    CT chest 12/10, June 2011.  No nodule on CT 06/2010.  Marland Kitchen Recurrent  Clostridium difficile diarrhea    Episode 09/19/2016: resolved with prolonged course of flagyl.  11/2016 recurrence treated with 10d of Dificid (Fidaxomicin) by GI (Dr. Earlean Shawl).  . Recurrent UTI    likely related to pt's atrophic vaginitis.  Urol started vaginal estrace 03/2016.  . Rib fracture 09/2018   R 7th, laterally-->sustained when her dog pulled her over and she fell.  . Shingles 02/2014   R abd/flank  . Spinal stenosis of lumbar region with neurogenic claudication    2008 MRI (L3-4, L4-5, L5-S1).  +S1 distribution sensory loss bilat    Tobacco History: Social History   Tobacco Use  Smoking Status Former Smoker  . Packs/day: 1.50  . Years: 34.00  . Pack years: 51.00  . Types: Cigarettes  . Start date: 28  . Quit date: 02/03/1990  . Years since quitting: 28.6  Smokeless Tobacco Never Used   Counseling given: Yes   Continue to not smoke  Outpatient Encounter Medications as of 10/05/2018  Medication Sig  . albuterol (PROAIR HFA) 108 (90 Base) MCG/ACT inhaler Inhale 2 puffs into the lungs every 4 (four) hours as needed for wheezing or shortness of breath.  Marland Kitchen albuterol (PROVENTIL) (2.5 MG/3ML) 0.083% nebulizer solution INHALE 1 VIAL VIA NEBULIZER EVERY 6 HOURS AS NEEDED FOR WHEEZING OR SHORTNESS OF BREATH  . albuterol (VENTOLIN HFA) 108 (90 Base) MCG/ACT inhaler Inhale 2 puffs into the lungs every 6 (six) hours as needed for wheezing or shortness of breath.  Marland Kitchen amLODipine (NORVASC) 5 MG tablet TAKE 1 TABLET BY MOUTH DAILY  . Artificial Tear Solution (SOOTHE XP) SOLN Place 1 drop into both eyes 2 (two) times daily as needed (irritation).  Marland Kitchen aspirin EC 81 MG tablet Take 81 mg by mouth daily.  Marland Kitchen azelastine (ASTELIN) 0.1 % nasal spray Place 2 sprays into both nostrils 2 (two) times daily. Use in each nostril as directed (Patient taking differently: Place 2 sprays into both nostrils 2 (two) times daily as needed for rhinitis or allergies. Use in each nostril as directed)  . diazepam  (VALIUM) 5 MG tablet TAKE 1 TABLET BY MOUTH EVERY NIGHT AT BEDTIME AND MAY TAKE AN ADDITIONAL TABLET AS NEEDED FOR ANXIETY  . diclofenac sodium (VOLTAREN) 1 % GEL Apply 4 g topically 4 (four) times daily.  . diphenhydrAMINE (  BENADRYL) 25 MG tablet Take 25 mg by mouth 3 (three) times daily as needed for allergies.  Marland Kitchen docusate sodium (COLACE) 100 MG capsule Take 100 mg by mouth 2 (two) times daily.  . fluorometholone (FML) 0.1 % ophthalmic suspension Place 1 drop into both eyes as directed. Tapering off  . fluticasone (FLONASE) 50 MCG/ACT nasal spray Place 1 spray into both nostrils daily. (Patient taking differently: Place 1 spray into both nostrils as needed. )  . Fluticasone-Umeclidin-Vilant (TRELEGY ELLIPTA) 100-62.5-25 MCG/INH AEPB Inhale 1 puff into the lungs daily.  . furosemide (LASIX) 20 MG tablet 1 tab po qd prn increased fluid collection in legs  . HYDROcodone-acetaminophen (NORCO/VICODIN) 5-325 MG tablet Take 1-2 tablets by mouth every 6 (six) hours as needed.  . lidocaine (LIDODERM) 5 % Place 1 patch onto the skin daily. Remove & Discard patch within 12 hours or as directed by MD  . montelukast (SINGULAIR) 10 MG tablet Take 10 mg by mouth at bedtime.   Marland Kitchen oxyCODONE (ROXICODONE) 5 MG immediate release tablet Take 1 tablet (5 mg total) by mouth every 6 (six) hours as needed for up to 15 doses for severe pain.  . Probiotic Product (PROBIOTIC DAILY PO) Take 1 capsule by mouth daily.   . roflumilast (DALIRESP) 500 MCG TABS tablet Take 1 tablet (500 mcg total) by mouth daily.  Marland Kitchen SYNTHROID 88 MCG tablet TAKE 1 TABLET BY MOUTH DAILY  . triamcinolone cream (KENALOG) 0.1 % Apply 1 application topically 2 (two) times daily.  Marland Kitchen venlafaxine (EFFEXOR) 50 MG tablet TAKE 1 TABLET BY MOUTH  TWICE DAILY  . venlafaxine (EFFEXOR) 50 MG tablet   . [DISCONTINUED] SPIRIVA HANDIHALER 18 MCG inhalation capsule INHALE 1 CAPSULE VIA HANDIHALER EVERY DAY AT THE SAME TIME  . [DISCONTINUED] SPIRIVA HANDIHALER 18 MCG  inhalation capsule   . [DISCONTINUED] Fluticasone-Salmeterol ([place]) 250-50 MCG/DOSE AEPB INHALE 1 PUFF INTO THE LUNGS TWICE DAILY   No facility-administered encounter medications on file as of 10/05/2018.      Review of Systems  Review of Systems  Constitutional: Positive for fatigue. Negative for activity change and fever.  HENT: Negative for sinus pressure, sinus pain and sore throat.   Respiratory: Positive for shortness of breath. Negative for cough and wheezing.   Cardiovascular: Negative for chest pain and palpitations.  Gastrointestinal: Negative for diarrhea, nausea and vomiting.  Musculoskeletal: Negative for arthralgias.       Rib pain from fall and fractures   Neurological: Negative for dizziness.  Psychiatric/Behavioral: Positive for sleep disturbance (sleeping in recliners ). The patient is nervous/anxious (esp at sleeping ).      Physical Exam  BP 116/68 (BP Location: Left Arm, Patient Position: Sitting, Cuff Size: Normal)   Pulse 67   Temp (!) 97.4 F (36.3 C) (Oral)   Ht 5' (1.524 m)   Wt 136 lb (61.7 kg)   SpO2 97%   BMI 26.56 kg/m   Wt Readings from Last 5 Encounters:  10/05/18 136 lb (61.7 kg)  09/23/18 135 lb (61.2 kg)  09/15/18 134 lb (60.8 kg)  09/08/18 135 lb (61.2 kg)  09/05/18 135 lb 4 oz (61.3 kg)     Physical Exam Vitals signs and nursing note reviewed.  Constitutional:      General: She is not in acute distress.    Appearance: Normal appearance. She is normal weight.  HENT:     Head: Normocephalic and atraumatic.     Right Ear: Tympanic membrane, ear canal and external ear normal.  There is no impacted cerumen.     Left Ear: Tympanic membrane, ear canal and external ear normal. There is no impacted cerumen.     Nose: Nose normal. No congestion.     Mouth/Throat:     Mouth: Mucous membranes are dry.     Pharynx: Oropharynx is clear.  Eyes:     Pupils: Pupils are equal, round, and reactive to light.  Neck:     Musculoskeletal:  Normal range of motion.  Cardiovascular:     Rate and Rhythm: Normal rate and regular rhythm.     Pulses: Normal pulses.     Heart sounds: Normal heart sounds. No murmur.  Pulmonary:     Effort: Pulmonary effort is normal. No respiratory distress.     Breath sounds: No decreased air movement. No decreased breath sounds, wheezing or rales.     Comments: Diminished breath sounds  Skin:    General: Skin is warm and dry.     Capillary Refill: Capillary refill takes less than 2 seconds.  Neurological:     General: No focal deficit present.     Mental Status: She is alert and oriented to person, place, and time. Mental status is at baseline.     Gait: Gait normal.  Psychiatric:        Mood and Affect: Mood normal.        Behavior: Behavior normal.        Thought Content: Thought content normal.        Judgment: Judgment normal.      Lab Results:  CBC    Component Value Date/Time   WBC 8.9 05/26/2018 0952   RBC 4.83 05/26/2018 0952   HGB 13.7 05/26/2018 0952   HCT 42.0 05/26/2018 0952   PLT 269.0 05/26/2018 0952   MCV 86.9 05/26/2018 0952   MCH 28.4 12/10/2017 0559   MCHC 32.7 05/26/2018 0952   RDW 16.0 (H) 05/26/2018 0952   LYMPHSABS 2.0 09/02/2017 1057   MONOABS 0.4 09/02/2017 1057   EOSABS 0.1 09/02/2017 1057   BASOSABS 0.0 09/02/2017 1057    BMET    Component Value Date/Time   NA 142 05/26/2018 0952   K 3.9 05/26/2018 0952   CL 106 05/26/2018 0952   CO2 26 05/26/2018 0952   GLUCOSE 68 (L) 05/26/2018 0952   BUN 16 05/26/2018 0952   CREATININE 0.82 05/26/2018 0952   CREATININE 0.84 02/27/2016 1533   CALCIUM 9.4 05/26/2018 0952   GFRNONAA >60 12/10/2017 0559   GFRAA >60 12/10/2017 0559    BNP    Component Value Date/Time   BNP 78.4 01/24/2015 1217    ProBNP No results found for: PROBNP    Assessment & Plan:   COPD GOLD III B Plan: Continue Trelegy Ellipta Continue Daliresp Continue Singulair Continue daily antihistamine Continue rescue inhaler  and/or nebulized meds as needed for shortness of breath and wheezing We will need to get patient scheduled for pulmonary function test prior to next office visit High-dose flu vaccine today   Allergic rhinitis Plan: Continue daily Singulair Continue daily antihistamine  Chronic hypoxemic respiratory failure ([place]) Plan: Continue oxygen therapy as prescribed  Multiple closed fractures of ribs of right side Plan: Start taking Tylenol with 200 mg of ibuprofen to help with pain management Can also use hydrocodone as needed as instructed for management of right rib fracture Discussed with primary care tomorrow pain management strategies Review AVS material provided today   Depression with anxiety Plan: Follow-up with primary care regarding  depression anxiety management Need further clarifications on Effexor dose and Valium use  Medication management Plan: Continue Trelegy Ellipta Likely will need to apply again in 2021 for [place] for you to try to help with inhaler coverage Need to follow-up with primary care regarding Effexor dose  Healthcare maintenance Plan: High-dose flu vaccine today Patient needs to check with pharmacy if she has had the Shingrix vaccine, if she is not received this this may be an opportune time if she is qualified for [place] for you and could receive this vaccine for free    Return in about 2 months (around 12/05/2018), or if symptoms worsen or fail to improve, for Follow up with Wyn Quaker FNP-C, Follow up with [name].   Lauraine Rinne, NP 10/05/2018   This appointment was 28 minutes long with over 50% of the time in direct face-to-face patient care, assessment, plan of care, and follow-up.

## 2018-10-04 NOTE — Telephone Encounter (Signed)
Received this message, are you okay with filling? Called patient and advised that if she still has issues breathing and her inhaler is not helping, to go to the hospital. Patient said that she is fine and will wait until script is sent

## 2018-10-04 NOTE — Telephone Encounter (Signed)
Pt states her nebulizer is no longer working, request that we place an order for a new machine to [facility] in [place]. Pt states she has already spoken to them and they have the neb she needs, just needs an order from our office.  VS ok to order new neb machine?  Thanks!

## 2018-10-05 ENCOUNTER — Ambulatory Visit (INDEPENDENT_AMBULATORY_CARE_PROVIDER_SITE_OTHER): Payer: Medicare Other

## 2018-10-05 ENCOUNTER — Ambulatory Visit (INDEPENDENT_AMBULATORY_CARE_PROVIDER_SITE_OTHER): Payer: Medicare Other | Admitting: Pulmonary Disease

## 2018-10-05 ENCOUNTER — Other Ambulatory Visit: Payer: Self-pay

## 2018-10-05 ENCOUNTER — Telehealth: Payer: Self-pay | Admitting: Family Medicine

## 2018-10-05 ENCOUNTER — Encounter: Payer: Self-pay | Admitting: Pulmonary Disease

## 2018-10-05 VITALS — BP 116/68 | HR 67 | Temp 97.4°F | Ht 60.0 in | Wt 136.0 lb

## 2018-10-05 DIAGNOSIS — S2231XA Fracture of one rib, right side, initial encounter for closed fracture: Secondary | ICD-10-CM | POA: Diagnosis not present

## 2018-10-05 DIAGNOSIS — H1045 Other chronic allergic conjunctivitis: Secondary | ICD-10-CM | POA: Diagnosis not present

## 2018-10-05 DIAGNOSIS — H04123 Dry eye syndrome of bilateral lacrimal glands: Secondary | ICD-10-CM | POA: Diagnosis not present

## 2018-10-05 DIAGNOSIS — J4489 Other specified chronic obstructive pulmonary disease: Secondary | ICD-10-CM

## 2018-10-05 DIAGNOSIS — Z0000 Encounter for general adult medical examination without abnormal findings: Secondary | ICD-10-CM | POA: Insufficient documentation

## 2018-10-05 DIAGNOSIS — J449 Chronic obstructive pulmonary disease, unspecified: Secondary | ICD-10-CM | POA: Diagnosis not present

## 2018-10-05 DIAGNOSIS — J9611 Chronic respiratory failure with hypoxia: Secondary | ICD-10-CM | POA: Diagnosis not present

## 2018-10-05 DIAGNOSIS — Z23 Encounter for immunization: Secondary | ICD-10-CM | POA: Diagnosis not present

## 2018-10-05 DIAGNOSIS — S2241XD Multiple fractures of ribs, right side, subsequent encounter for fracture with routine healing: Secondary | ICD-10-CM

## 2018-10-05 DIAGNOSIS — R0781 Pleurodynia: Secondary | ICD-10-CM

## 2018-10-05 DIAGNOSIS — Z79899 Other long term (current) drug therapy: Secondary | ICD-10-CM

## 2018-10-05 DIAGNOSIS — H16223 Keratoconjunctivitis sicca, not specified as Sjogren's, bilateral: Secondary | ICD-10-CM | POA: Diagnosis not present

## 2018-10-05 DIAGNOSIS — F418 Other specified anxiety disorders: Secondary | ICD-10-CM | POA: Diagnosis not present

## 2018-10-05 DIAGNOSIS — S2241XA Multiple fractures of ribs, right side, initial encounter for closed fracture: Secondary | ICD-10-CM | POA: Insufficient documentation

## 2018-10-05 DIAGNOSIS — J309 Allergic rhinitis, unspecified: Secondary | ICD-10-CM

## 2018-10-05 HISTORY — PX: OTHER SURGICAL HISTORY: SHX169

## 2018-10-05 NOTE — Assessment & Plan Note (Signed)
Plan: Continue Trelegy Ellipta Likely will need to apply again in 2021 for [place] for you to try to help with inhaler coverage Need to follow-up with primary care regarding Effexor dose

## 2018-10-05 NOTE — Telephone Encounter (Signed)
Patient contacted Dr. Juanetta Gosling office and they ordered her a new neb. Just wanted you to know. Thank you

## 2018-10-05 NOTE — Assessment & Plan Note (Signed)
Plan: Continue daily Singulair Continue daily antihistamine

## 2018-10-05 NOTE — Progress Notes (Signed)
Reviewed and agree with assessment/plan.   Brinna Divelbiss, MD [facility] 01/30/2016, 12:24 PM Pager:  [phone]  

## 2018-10-05 NOTE — Assessment & Plan Note (Addendum)
Plan: Continue Trelegy Ellipta Continue Daliresp Continue Singulair Continue daily antihistamine Continue rescue inhaler and/or nebulized meds as needed for shortness of breath and wheezing We will need to get patient scheduled for pulmonary function test prior to next office visit High-dose flu vaccine today

## 2018-10-05 NOTE — Telephone Encounter (Signed)
OK to send rx for nebulizer machine with supplies to [facility]. Dx is copd unspecified AND chronic hypoxic resp failure.-thx

## 2018-10-05 NOTE — Assessment & Plan Note (Signed)
Plan: Continue oxygen therapy as prescribed 

## 2018-10-05 NOTE — Patient Instructions (Addendum)
You were seen today by Lauraine Rinne, NP  for:   1. COPD GOLD III B  Trelegy Ellipta  >>> 1 puff daily in the morning >>>rinse mouth out after use  >>> This inhaler contains 3 medications that help manage her respiratory status, contact our office if you cannot afford this medication or unable to remain on this medication  Only use your albuterol as a rescue medication to be used if you can't catch your breath by resting or doing a relaxed purse lip breathing pattern.  - The less you use it, the better it will work when you need it. - Ok to use up to 2 puffs  every 4 hours if you must but call for immediate appointment if use goes up over your usual need - Don't leave home without it !!  (think of it like the spare tire for your car)   Continue albuterol nebulized meds as prescribed  Note your daily symptoms > remember "red flags" for COPD:   >>>Increase in cough >>>increase in sputum production >>>increase in shortness of breath or activity  intolerance.   If you notice these symptoms, please call the office to be seen.   High-dose flu vaccine today  2. Chronic hypoxemic respiratory failure (HCC)   Continue oxygen therapy as prescribed  >>>maintain oxygen saturations greater than 88 percent  >>>if unable to maintain oxygen saturations please contact the office  >>>do not smoke with oxygen  >>>can use nasal saline gel or nasal saline rinses to moisturize nose if oxygen causes dryness  3. Closed fracture of multiple ribs of right side with routine healing, subsequent encounter  Chest x-ray today stable  Trying taking Tylenol/acetaminophen 500 mg with ibuprofen 200 mg every 6 hours as needed for pain  Can continue to take hydrocodone as prescribed if needed for pain, this medication is sedating  Keep follow-up with primary care tomorrow  4. Depression with anxiety  Continue follow-up with primary care, discussed these symptoms with primary care tomorrow  5. Medication  management  Follow-up with primary care regarding management of your Effexor  6. Healthcare maintenance  High-dose flu vaccine today  Check with your pharmacy to see if you have had your Shingrix vaccine, I believe that you could receive this for free through [place] for you which is high you receive your Trelegy Ellipta currently right now.   Follow Up:    Return in about 2 months (around 12/05/2018), or if symptoms worsen or fail to improve, for Follow up with Wyn Quaker FNP-C, Follow up with [name].   Please do your part to reduce the spread of COVID-19:      Reduce your risk of any infection  and COVID19 by using the similar precautions used for avoiding the common cold or flu:  Marland Kitchen Wash your hands often with soap and warm water for at least 20 seconds.  If soap and water are not readily available, use an alcohol-based hand sanitizer with at least 60% alcohol.  . If coughing or sneezing, cover your mouth and nose by coughing or sneezing into the elbow areas of your shirt or coat, into a tissue or into your sleeve (not your hands). Langley Gauss A MASK when in public  . Avoid shaking hands with others and consider head nods or verbal greetings only. . Avoid touching your eyes, nose, or mouth with unwashed hands.  . Avoid close contact with people who are sick. . Avoid places or events with large numbers of  people in one location, like concerts or sporting events. . If you have some symptoms but not all symptoms, continue to monitor at home and seek medical attention if your symptoms worsen. . If you are having a medical emergency, call 911.   [place] / e-Visit: [email or web address]         [facility]: [facility]: [id number]                   [facility]: [id number]     It is flu season:   >>> Best ways to protect herself  from the flu: Receive the yearly flu vaccine, practice good hand hygiene washing with soap and also using hand sanitizer when available, eat a nutritious meals, get adequate rest, hydrate appropriately   Please contact the office if your symptoms worsen or you have concerns that you are not improving.   Thank you for choosing [facility] for your healthcare, and for allowing [place] to partner with you on your healthcare journey. I am thankful to be able to provide care to you today.   Wyn Quaker FNP-C     Rib Fracture  A rib fracture is a break or crack in one of the bones of the ribs. The ribs are like a cage that goes around your upper chest. A broken or cracked rib is often painful, but most do not cause other problems. Most rib fractures usually heal on their own in 1-3 months. Follow these instructions at home: Managing pain, stiffness, and swelling  If directed, apply ice to the injured area. ? Put ice in a plastic bag. ? Place a towel between your skin and the bag. ? Leave the ice on for 20 minutes, 2-3 times a day.  Take over-the-counter and prescription medicines only as told by your doctor. Activity  Avoid activities that cause pain to the injured area. Protect your injured area.  Slowly increase activity as told by your doctor. General instructions  Do deep breathing as told by your doctor. You may be told to: ? Take deep breaths many times a day. ? Cough many times a day while hugging a pillow. ? Use a device (incentive spirometer) to do deep breathing many times a day.  Drink enough fluid to keep your pee (urine) clear or pale yellow.  Do not wear a rib belt or binder. These do not allow you to breathe deeply.  Keep all follow-up visits as told by your doctor. This is important. Contact a doctor if:  You have a fever. Get help right away if:  You have trouble breathing.  You are short of breath.  You cannot stop coughing.  You cough up thick or  bloody spit (sputum).  You feel sick to your stomach (nauseous), throw up (vomit), or have belly (abdominal) pain.  Your pain gets worse and medicine does not help. Summary  A rib fracture is a break or crack in one of the bones of the ribs.  Apply ice to the injured area and take medicines for pain as told by your doctor.  Take deep breaths and cough many times a day. Hug a pillow every time you cough. This information is not intended to replace advice given to you by your health care provider. Make sure you discuss any questions you have with your health care provider. Document Released: 10/30/2007 Document Revised: 01/02/2017 Document Reviewed: 04/22/2016 Elsevier Patient  Education  [employer].   Influenza Virus Vaccine injection What is this medicine? INFLUENZA VIRUS VACCINE (in floo EN zuh VAHY ruhs vak SEEN) helps to reduce the risk of getting influenza also known as the flu. The vaccine only helps protect you against some strains of the flu. This medicine may be used for other purposes; ask your health care provider or pharmacist if you have questions. COMMON BRAND NAME(S): Afluria, Afluria Quadrivalent, Agriflu, Alfuria, FLUAD, Fluarix, Fluarix Quadrivalent, Flublok, Flublok Quadrivalent, FLUCELVAX, Flulaval, Fluvirin, Fluzone, Fluzone High-Dose, Fluzone Intradermal What should I tell my health care provider before I take this medicine? They need to know if you have any of these conditions:  bleeding disorder like hemophilia  fever or infection  Guillain-Barre syndrome or other neurological problems  immune system problems  infection with the human immunodeficiency virus (HIV) or AIDS  low blood platelet counts  multiple sclerosis  an unusual or allergic reaction to influenza virus vaccine, latex, other medicines, foods, dyes, or preservatives. Different brands of vaccines contain different allergens. Some may contain latex or eggs. Talk to your doctor about  your allergies to make sure that you get the right vaccine.  pregnant or trying to get pregnant  breast-feeding How should I use this medicine? This vaccine is for injection into a muscle or under the skin. It is given by a health care professional. A copy of Vaccine Information Statements will be given before each vaccination. Read this sheet carefully each time. The sheet may change frequently. Talk to your healthcare provider to see which vaccines are right for you. Some vaccines should not be used in all age groups. Overdosage: If you think you have taken too much of this medicine contact a poison control center or emergency room at once. NOTE: This medicine is only for you. Do not share this medicine with others. What if I miss a dose? This does not apply. What may interact with this medicine?  chemotherapy or radiation therapy  medicines that lower your immune system like etanercept, anakinra, infliximab, and adalimumab  medicines that treat or prevent blood clots like warfarin  phenytoin  steroid medicines like prednisone or cortisone  theophylline  vaccines This list may not describe all possible interactions. Give your health care provider a list of all the medicines, herbs, non-prescription drugs, or dietary supplements you use. Also tell them if you smoke, drink alcohol, or use illegal drugs. Some items may interact with your medicine. What should I watch for while using this medicine? Report any side effects that do not go away within 3 days to your doctor or health care professional. Call your health care provider if any unusual symptoms occur within 6 weeks of receiving this vaccine. You may still catch the flu, but the illness is not usually as bad. You cannot get the flu from the vaccine. The vaccine will not protect against colds or other illnesses that may cause fever. The vaccine is needed every year. What side effects may I notice from receiving this medicine?  Side effects that you should report to your doctor or health care professional as soon as possible:  allergic reactions like skin rash, itching or hives, swelling of the face, lips, or tongue Side effects that usually do not require medical attention (report to your doctor or health care professional if they continue or are bothersome):  fever  headache  muscle aches and pains  pain, tenderness, redness, or swelling at the injection site  tiredness This list may  not describe all possible side effects. Call your doctor for medical advice about side effects. You may report side effects to FDA at 1-800-FDA-1088. Where should I keep my medicine? The vaccine will be given by a health care professional in a clinic, pharmacy, doctor's office, or other health care setting. You will not be given vaccine doses to store at home. NOTE: This sheet is a summary. It may not cover all possible information. If you have questions about this medicine, talk to your doctor, pharmacist, or health care provider.  2020 Elsevier/Gold Standard (2017-12-15 08:45:43)

## 2018-10-05 NOTE — Assessment & Plan Note (Signed)
Plan: Follow-up with primary care regarding depression anxiety management Need further clarifications on Effexor dose and Valium use

## 2018-10-05 NOTE — Telephone Encounter (Signed)
Patient is presenting with dermatograthism. She is scratching anywhere her clothes touch her. Provider would like to recommend any OTC antihistamine such as zyrtec or Claritin. She would like to get your input. Please call her

## 2018-10-05 NOTE — Telephone Encounter (Signed)
Noted  

## 2018-10-05 NOTE — Telephone Encounter (Signed)
Pt has been scheduled for tomorrow, in office.

## 2018-10-05 NOTE — Assessment & Plan Note (Signed)
Plan: High-dose flu vaccine today Patient needs to check with pharmacy if she has had the Shingrix vaccine, if she is not received this this may be an opportune time if she is qualified for [place] for you and could receive this vaccine for free

## 2018-10-05 NOTE — Assessment & Plan Note (Signed)
Plan: Start taking Tylenol with 200 mg of ibuprofen to help with pain management Can also use hydrocodone as needed as instructed for management of right rib fracture Discussed with primary care tomorrow pain management strategies Review AVS material provided today

## 2018-10-06 ENCOUNTER — Encounter: Payer: Self-pay | Admitting: Family Medicine

## 2018-10-06 ENCOUNTER — Ambulatory Visit (INDEPENDENT_AMBULATORY_CARE_PROVIDER_SITE_OTHER): Payer: Medicare Other | Admitting: Family Medicine

## 2018-10-06 ENCOUNTER — Other Ambulatory Visit: Payer: Self-pay

## 2018-10-06 VITALS — BP 122/67 | HR 80 | Temp 98.2°F | Resp 16 | Ht 60.0 in | Wt 135.8 lb

## 2018-10-06 DIAGNOSIS — F32A Depression, unspecified: Secondary | ICD-10-CM

## 2018-10-06 DIAGNOSIS — F419 Anxiety disorder, unspecified: Secondary | ICD-10-CM

## 2018-10-06 DIAGNOSIS — R0781 Pleurodynia: Secondary | ICD-10-CM | POA: Diagnosis not present

## 2018-10-06 DIAGNOSIS — S2231XD Fracture of one rib, right side, subsequent encounter for fracture with routine healing: Secondary | ICD-10-CM

## 2018-10-06 DIAGNOSIS — F329 Major depressive disorder, single episode, unspecified: Secondary | ICD-10-CM | POA: Diagnosis not present

## 2018-10-06 NOTE — Progress Notes (Signed)
OFFICE VISIT  10/06/2018   CC:  Chief Complaint  Patient presents with  . Follow-up    pain    HPI:    Patient is a 83 y.o. Caucasian female who presents for f/u pain from right sided rib fracture sustained in a fall. I last saw her 09/23/18. Date of injury/fracture was 09/05/18.    Interim hx: She saw pulm MD yesterday, no changes made.  Has required more albuterol since fracture but seems to be for increased SOB caused by the pain of breathing different with rib fractures. Pain is improved some lately but still there.  She still occ takes a vicodin, but none since day before yesterday. Took an advil and a tylenol last evening before bed. Anxiety and depression has been more of a problem lately, was crying a lot, isolating more.  She has this occasionally and she increases effexor to 50mg  bid instead of qd and increased diazepam from qhs to bid.  She increased them 4-5 d/a and is already feeling improved a little bit.  No SI or HI.  Says she has felt more SOB last couple days--breaths don't feel like they are going in completely normal.  No chest tightness or wheezing.  No cough.  Wearing her oxygen today.  Past Medical History:  Diagnosis Date  . Allergic rhinitis    with upper airway cough: as of 07/2017 pulm f/u she was instructed to use astelin, flonase, and saline more consistently  . BPPV (benign paroxysmal positional vertigo) 03/2018  . Chest pain, non-cardiac    Cardiac CT showed no signif obstructive dz, mildly elevated calcium level (Dr. [name]).  . Chronic cystitis with hematuria    [name]  . Chronic hypoxemic respiratory failure ([place]) 02/02/2015   2L oxygen 24/7  . COPD (chronic obstructive pulmonary disease) (HCC)    GOLD 4.  spirometry 01/10/09 FEV 0.97(52%), FEV1% 47.  With chronic bronchitis as of 07/2017.  . DDD (degenerative disc disease), cervical    1998 MRI C5-6 impingemt (left)  . DDD (degenerative disc disease), lumbar   . Depression with  anxiety 04/15/2011  . Diarrheal disease Summer 2017   ? 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).  As of 10/05/15 pt reports GI dx'd her with C diff and rx'd more flagyl and she is also on cholestyramine.  As of 02/2016, pt's sx's resolved completely.  . Diverticulosis of colon    Procto '97 and colonoscopy 2002  . Dysplastic nevus of upper extremity 04/2014   R tricep ([name])  . Edema of both lower extremities due to peripheral venous insufficiency    Varicosities bilat, swelling L>R.  R baker's cyst.  Got vein clinic eval 02/2018->sclerotherapy recommended but since no hemorrhage or ulceration, insurance will not cover the procedure.  . Family history of adverse reaction to anesthesia    mother died when patient age 38 receiving anesthesia for thyroid surgery  . Fibrocystic breast disease    w/fibroadenoma.  Bx's showed NO atypia (Dr. Margot Chimes).  Mammo neg 2008.  Marland Kitchen GERD (gastroesophageal reflux disease)   . History of double vision    Ophthalmologist, Dr. Herbert Deaner, is further evaluating this with MRI  orbits and limited brain (myesthenia gravis testing neg 07/2010)  . History of home oxygen therapy    uses 2 liters at hs  . Hypertension    EKG 03/2010 normal  . Hypothyroidism    Hashimoto's, dx'd 1989  .  Lesion of right native kidney 2017   found on CT 02/2015.  F/u MRI 03/31/15 showed it to be smaller and less likely of any significance but repeat CT renal protocol in 69mo was recommended by radiology.  This was done 10/26/15 and showed no interval change in the lesion, but b/c the lesion enhances with contrast, radiol rec'd urol consult (b/c pap renal RCC could not be excluded).  Saw urol 03/06/16--stable on u/s 09/2016 and 09/2017.  . Numbness in feet 04/2018   S1 distribution bilat, c/w spinal stenosis sensory neuropathy (no pain).  PN being evaluated with NCS/EMG by Dr. Posey Pronto as of 08/2018.  .  Osteoarthritis, hip, bilateral    she is s/p bilat THA as of 02/2018.  Marland Kitchen Pneumonia   . Postmenopausal atrophic vaginitis    improved with estrace 2 X/week.  . Pulmonary nodule    CT chest 12/10, June 2011.  No nodule on CT 06/2010.  Marland Kitchen Recurrent Clostridium difficile diarrhea    Episode 09/19/2016: resolved with prolonged course of flagyl.  11/2016 recurrence treated with 10d of Dificid (Fidaxomicin) by GI (Dr. Earlean Shawl).  . Recurrent UTI    likely related to pt's atrophic vaginitis.  Urol started vaginal estrace 03/2016.  . Rib fracture 09/2018   R 7th, laterally-->sustained when her dog pulled her over and she fell.  . Shingles 02/2014   R abd/flank  . Spinal stenosis of lumbar region with neurogenic claudication    2008 MRI (L3-4, L4-5, L5-S1).  +S1 distribution sensory loss bilat    Past Surgical History:  Procedure Laterality Date  . ABDOMINAL HYSTERECTOMY  1978   no BSO per pt--nonmalignant reasons  . APPENDECTOMY    . BREAST CYST EXCISION     Last screening mammogram 10/2010 was normal.  . CHOLECYSTECTOMY  2001  . COLONOSCOPY  04/2005; 12/06/2015   2007 NORMAL.  2017 adenomatous polyp x 1.  Mod sigmoid diverticulosis (Dr. Earlean Shawl).    Marland Kitchen DEXA  05/18/2017   NORMAL (T-score 0.1)  . HIP CLOSED REDUCTION Right 12/25/2015   Procedure: CLOSED REDUCTION HIP;  Surgeon: Nicholes Stairs, MD;  Location: [facility];  Service: Orthopedics;  Laterality: Right;  . TONSILLECTOMY    . TOTAL HIP ARTHROPLASTY Right 11/01/2014   Procedure: RIGHT TOTAL HIP ARTHROPLASTY;  Surgeon: Latanya Maudlin, MD;  Location: [facility];  Service: Orthopedics;  Laterality: Right;  . TOTAL HIP ARTHROPLASTY Left 12/08/2017   Procedure: LEFT TOTAL HIP ARTHROPLASTY ANTERIOR APPROACH;  Surgeon: Paralee Cancel, MD;  Location: [facility];  Service: Orthopedics;  Laterality: Left;  70 mins  . TRANSTHORACIC ECHOCARDIOGRAM  08/2014   EF 60-65%, grade I diast dysfxn  . TUBAL LIGATION  1974    Outpatient Medications Prior to Visit   Medication Sig Dispense Refill  . amLODipine (NORVASC) 5 MG tablet TAKE 1 TABLET BY MOUTH DAILY 90 tablet 3  . Artificial Tear Solution (SOOTHE XP) SOLN Place 1 drop into both eyes 2 (two) times daily as needed (irritation).    Marland Kitchen aspirin EC 81 MG tablet Take 81 mg by mouth daily.    Marland Kitchen azelastine (ASTELIN) 0.1 % nasal spray Place 2 sprays into both nostrils 2 (two) times daily. Use in each nostril as directed (Patient taking differently: Place 2 sprays into both nostrils 2 (two) times daily as needed for rhinitis or allergies. Use in each nostril as directed) 30 mL 3  . diazepam (VALIUM) 5 MG tablet TAKE 1 TABLET BY MOUTH EVERY NIGHT AT BEDTIME AND MAY TAKE AN  ADDITIONAL TABLET AS NEEDED FOR ANXIETY 60 tablet 5  . diclofenac sodium (VOLTAREN) 1 % GEL Apply 4 g topically 4 (four) times daily. 100 g 1  . diphenhydrAMINE (BENADRYL) 25 MG tablet Take 25 mg by mouth 3 (three) times daily as needed for allergies.    Marland Kitchen docusate sodium (COLACE) 100 MG capsule Take 100 mg by mouth 2 (two) times daily.    . fluorometholone (FML) 0.1 % ophthalmic suspension Place 1 drop into both eyes as directed. Tapering off    . fluticasone (FLONASE) 50 MCG/ACT nasal spray Place 1 spray into both nostrils daily. (Patient taking differently: Place 1 spray into both nostrils as needed. ) 16 g 2  . Fluticasone-Umeclidin-Vilant (TRELEGY ELLIPTA) 100-62.5-25 MCG/INH AEPB Inhale 1 puff into the lungs daily. 1 each 11  . furosemide (LASIX) 20 MG tablet 1 tab po qd prn increased fluid collection in legs 30 tablet 3  . HYDROcodone-acetaminophen (NORCO/VICODIN) 5-325 MG tablet Take 1-2 tablets by mouth every 6 (six) hours as needed. 15 tablet 0  . lidocaine (LIDODERM) 5 % Place 1 patch onto the skin daily. Remove & Discard patch within 12 hours or as directed by MD 30 patch 0  . montelukast (SINGULAIR) 10 MG tablet Take 10 mg by mouth at bedtime.     Marland Kitchen oxyCODONE (ROXICODONE) 5 MG immediate release tablet Take 1 tablet (5 mg total) by  mouth every 6 (six) hours as needed for up to 15 doses for severe pain. 40 tablet 0  . Probiotic Product (PROBIOTIC DAILY PO) Take 1 capsule by mouth daily.     . roflumilast (DALIRESP) 500 MCG TABS tablet Take 1 tablet (500 mcg total) by mouth daily. 30 tablet 11  . SYNTHROID 88 MCG tablet TAKE 1 TABLET BY MOUTH DAILY 30 tablet 5  . triamcinolone cream (KENALOG) 0.1 % Apply 1 application topically 2 (two) times daily. 30 g 0  . venlafaxine (EFFEXOR) 50 MG tablet TAKE 1 TABLET BY MOUTH  TWICE DAILY 180 tablet 0  . venlafaxine (EFFEXOR) 50 MG tablet     . albuterol (PROAIR HFA) 108 (90 Base) MCG/ACT inhaler Inhale 2 puffs into the lungs every 4 (four) hours as needed for wheezing or shortness of breath. (Patient not taking: Reported on 10/06/2018) 8.5 g 1  . albuterol (PROVENTIL) (2.5 MG/3ML) 0.083% nebulizer solution INHALE 1 VIAL VIA NEBULIZER EVERY 6 HOURS AS NEEDED FOR WHEEZING OR SHORTNESS OF BREATH (Patient not taking: Reported on 10/06/2018) 75 mL 1  . albuterol (VENTOLIN HFA) 108 (90 Base) MCG/ACT inhaler Inhale 2 puffs into the lungs every 6 (six) hours as needed for wheezing or shortness of breath. (Patient not taking: Reported on 10/06/2018) 8 g 3   No facility-administered medications prior to visit.     Allergies  Allergen Reactions  . Losartan Other (See Comments)    hyperkalemia  . Penicillins Hives, Swelling and Rash    Has patient had a PCN reaction causing immediate rash, facial/tongue/throat swelling, SOB or lightheadedness with hypotension: YES Has patient had a PCN reaction causing severe rash involving mucus membranes or skin necrosis: NO Has patient had a PCN reaction that required hospitalization NO Has patient had a PCN reaction occurring within the last 10 years: NO If all of the above answers are "NO", then may proceed with Cephalosporin use.   . Cefixime [Kdc:Cefixime] Other (See Comments)    unspecified  . Indocin [Indomethacin] Other (See Comments)    Painful tongue  and throat  . Singulair [employer]  Sodium]     Chest tightness   . Sulfa Antibiotics Other (See Comments)    unspecified  . Ace Inhibitors Other (See Comments)    cough  . Statins Other (See Comments)    Myalgias     ROS As per HPI  PE: Blood pressure 122/67, pulse 80, temperature 98.2 F (36.8 C), temperature source Temporal, resp. rate 16, height 5' (1.524 m), weight 135 lb 12.8 oz (61.6 kg), SpO2 92 %. Gen: Alert, well appearing.  Patient is oriented to person, place, time, and situation. AFFECT: pleasant, lucid thought and speech. CV: RRR, no m/r/g.   LUNGS: CTA bilat, nonlabored resps, good aeration in all lung fields. EXT: no clubbing or cyanosis.  1-2+ pitting edema in ankles, R a bit worse than L.  LABS:  None today  IMPRESSION AND PLAN:  1) Right sided rib fracture: now starting to get better. Continue tylenol/advil as first line, add vicodin or oxycodone for severe pain. No new rx's given today.  2) Anxiety and depression; she took a dip recently likely from the problems with pain/rib fx/multiple MD visits, etc. Now feeling a bit improved since increasing her effexor 75mg  to 2 tabs per day and her valium to 2 tabs per day.  I encouraged her to take these meds at this dosing CHRONICALLY rather than just lowering and increasing dose periodically but she has a psychological block to this idea.  An After Visit Summary was printed and given to the patient.  FOLLOW UP: Return in about 4 weeks (around 11/03/2018) for f/u mood and rib pain.  Signed:  Crissie Sickles, MD           10/06/2018

## 2018-10-07 ENCOUNTER — Telehealth: Payer: Self-pay | Admitting: Family Medicine

## 2018-10-07 NOTE — Telephone Encounter (Signed)
Pt was notified, she has been taking benadryl which I advised was also an antihistamine.

## 2018-10-07 NOTE — Telephone Encounter (Signed)
Spoke with patient and she was not sure name of antihistamine. Creams and other meds have not helped with itching. She just wants to clear it with you.  Please advise, thanks.

## 2018-10-07 NOTE — Telephone Encounter (Signed)
Patient forgotten mention to Dr. Anitra Lauth in her office appt yesterday: Patient sees a dermatologist last week, they want to give her something for itching but would not until PCP was aware of meds and okay to give to her. Antihistamine for itching  Please contact patient  Thank you

## 2018-10-07 NOTE — Telephone Encounter (Signed)
Yes, any antihistamine is fine.

## 2018-10-08 ENCOUNTER — Telehealth: Payer: Self-pay | Admitting: Pulmonary Disease

## 2018-10-08 NOTE — Telephone Encounter (Signed)
Any other symptoms?  Is she coughing up discolored mucus?  She having body aches or fatigue?  Residual simply the shortness of breath.  Patient has a mixture of contributing factors.  She has currently right rib fractures.  She has COPD.  She also has known anxiety which she has been working with primary care to help manage as her anxiety has worsened lately.   Unsure if this is an acute pulmonary process or potentially worsened anxiety which then albuterol likely does not help relief.  Does the patient feel that she has been more anxious lately at these times?  Wyn Quaker, FNP

## 2018-10-08 NOTE — Telephone Encounter (Signed)
Spoke with pt. States that she is having increased chest tightness and shortness of breath. Chest tightness started at 0400. Reports that she got out of bed and did an albuterol neb treatment, sat up in her recliner and the tightness lessened but then came back around 0730. At that time she used her albuterol HFA with some relief but now the chest tightness is back. Pt would like Brian's recommendations. She politely denied any type of visit.  Aaron Edelman - please advise. Thanks.

## 2018-10-08 NOTE — Telephone Encounter (Signed)
ATC pt, there was no answer and I could not leave a message. Will try back. 

## 2018-10-12 ENCOUNTER — Other Ambulatory Visit: Payer: Self-pay

## 2018-10-12 ENCOUNTER — Ambulatory Visit (INDEPENDENT_AMBULATORY_CARE_PROVIDER_SITE_OTHER): Payer: Medicare Other | Admitting: Neurology

## 2018-10-12 ENCOUNTER — Encounter: Payer: Self-pay | Admitting: Neurology

## 2018-10-12 ENCOUNTER — Other Ambulatory Visit (INDEPENDENT_AMBULATORY_CARE_PROVIDER_SITE_OTHER): Payer: Medicare Other

## 2018-10-12 VITALS — BP 110/82 | HR 78 | Ht 60.0 in | Wt 130.0 lb

## 2018-10-12 DIAGNOSIS — G959 Disease of spinal cord, unspecified: Secondary | ICD-10-CM

## 2018-10-12 DIAGNOSIS — G629 Polyneuropathy, unspecified: Secondary | ICD-10-CM

## 2018-10-12 DIAGNOSIS — R292 Abnormal reflex: Secondary | ICD-10-CM

## 2018-10-12 DIAGNOSIS — R202 Paresthesia of skin: Secondary | ICD-10-CM

## 2018-10-12 DIAGNOSIS — Z79899 Other long term (current) drug therapy: Secondary | ICD-10-CM

## 2018-10-12 NOTE — Patient Instructions (Signed)
MRI cervical spine without contrast  Check labs

## 2018-10-12 NOTE — Procedures (Signed)
[facility]  [address]  [place], [place] [address] Tel: [phone] Fax:  [phone] Test Date:  10/12/2018  Patient: Priscilla Cantrell DOB: Jan 04, 1936 Physician: Narda Amber, DO  Sex: Female Height: 5\' 0"  Ref Phys: Narda Amber, DO  ID#: [id number] Temp: 31.0C Technician:    Patient Complaints: This is an 83 year old female referred for evaluation of bilateral feet numbness.  NCV & EMG Findings: Extensive electrodiagnostic testing of the right lower extremity and additional studies of the left shows:  1. Bilateral sural and superficial peroneal sensory responses are absent. 2. Bilateral peroneal (EDB) and tibial motor responses show reduced amplitude, worse on the right.  Bilateral peroneal motor responses at the tibialis anterior is within normal limits. 3. Bilateral tibial H reflex studies are within normal limits. 4. Chronic motor axonal loss changes are isolated to the flexor digitorum longus muscles bilaterally, without accompanied active denervation  Impression: The electrophysiologic findings are consistent with a chronic and symmetric sensorimotor axonal polyneuropathy affecting the lower extremities.   ___________________________ Narda Amber, DO    Nerve Conduction Studies Anti Sensory Summary Table   Site NR Peak (ms) Norm Peak (ms) P-T Amp (V) Norm P-T Amp  Left Sup Peroneal Anti Sensory (Ant Lat Mall)  31C  12 cm NR  <4.6  >3  Right Sup Peroneal Anti Sensory (Ant Lat Mall)  31C  12 cm NR  <4.6  >3  Left Sural Anti Sensory (Lat Mall)  31C  Calf NR  <4.6  >3  Right Sural Anti Sensory (Lat Mall)  31C  Calf NR  <4.6  >3   Motor Summary Table   Site NR Onset (ms) Norm Onset (ms) O-P Amp (mV) Norm O-P Amp Site1 Site2 Delta-0 (ms) Dist (cm) Vel (m/s) Norm Vel (m/s)  Left Peroneal Motor (Ext Dig Brev)  31C  Ankle    3.6 <6.0 2.0 >2.5 B Fib Ankle 9.2 37.0 40 >40  B Fib    12.8  2.0  Poplt B Fib 1.7 7.0 41 >40  Poplt    14.5  1.9          Right Peroneal Motor (Ext Dig Brev)  31C  Ankle    3.2 <6.0 0.4 >2.5 B Fib Ankle 8.8 38.0 43 >40  B Fib    12.0  0.5  Poplt B Fib 1.2 7.0 58 >40  Poplt    13.2  0.3         Left Peroneal TA Motor (Tib Ant)  31C  Fib Head    2.7 <4.5 4.3 >3 Poplit Fib Head 1.3 7.0 54 >40  Poplit    4.0  4.1         Right Peroneal TA Motor (Tib Ant)  31C  Fib Head    1.9 <4.5 4.7 >3 Poplit Fib Head 1.2 7.0 58 >40  Poplit    3.1  4.7         Left Tibial Motor (Abd Hall Brev)  31C  Ankle    3.3 <6.0 2.5 >4 Knee Ankle 9.4 40.0 43 >40  Knee    12.7  2.4         Right Tibial Motor (Abd Hall Brev)  31C  Ankle    3.8 <6.0 0.7 >4 Knee Ankle  37.0  >40  Knee NR             H Reflex Studies   NR H-Lat (ms) Lat Norm (ms) L-R H-Lat (ms)  Left Tibial (Gastroc)  31C     31.02 <35 0.00  Right Tibial (Gastroc)  31C     31.02 <35 0.00   EMG   Side Muscle Ins Act Fibs Psw Fasc Number Recrt Dur Dur. Amp Amp. Poly Poly. Comment  Right AntTibialis Nml Nml Nml Nml Nml Nml Nml Nml Nml Nml Nml Nml N/A  Right Gastroc Nml Nml Nml Nml Nml Nml Nml Nml Nml Nml Nml Nml N/A  Right Flex Dig Long Nml Nml Nml Nml 1- Rapid Some 1+ Some 1+ Some 1+ N/A  Right RectFemoris Nml Nml Nml Nml Nml Nml Nml Nml Nml Nml Nml Nml N/A  Right GluteusMed Nml Nml Nml Nml Nml Nml Nml Nml Nml Nml Nml Nml N/A  Left AntTibialis Nml Nml Nml Nml Nml Nml Nml Nml Nml Nml Nml Nml N/A  Left Gastroc Nml Nml Nml Nml Nml Nml Nml Nml Nml Nml Nml Nml N/A  Left Flex Dig Long Nml Nml Nml Nml 1- Rapid Some 1+ Some 1+ Some 1+ N/A      Waveforms:

## 2018-10-12 NOTE — Telephone Encounter (Signed)
Called patient, she did not answer. Will call back later.

## 2018-10-12 NOTE — Progress Notes (Signed)
Follow-up Visit   Date: 10/12/18   Priscilla Cantrell MRN: [id number] DOB: 14-Apr-1935   Interim History: Priscilla Cantrell is a 83 y.o. right-handed Caucasian female with , GERD, and hypertension returning to the clinic for follow-up of bilateral feet numbness.  The patient was accompanied to the clinic by self.  History of present illness: Starting around 2019, she began having abnormal sensation in the feet, as if she was walking on bubbles/balls.  She has numbness over the soles of the feet and intermittent tingling. She has also noticed difficulty with flexing her toes on both feet.  No difficulty extending her toes.  She walks unassisted and has not had any falls.  She has some imbalance on uneven ground.  Symptoms are constant.  There is nothing which alleviates or exacerbates her symptoms.   No history of diabetes, alcohol use, or family history of neuropathy.   UPDATE 10/12/2018:  She is here for follow-up and EDX testing.  She continues to have numbness in the feet, no pain or tingling.  Today, she is also complaining of weakness in the hands, often dropping objects.  There is no numbness or tingling in the hands.  She denies neck pain.  She does complain of cramps, especially in the left leg.  Medications:  Current Outpatient Medications on File Prior to Visit  Medication Sig Dispense Refill  . albuterol (PROAIR HFA) 108 (90 Base) MCG/ACT inhaler Inhale 2 puffs into the lungs every 4 (four) hours as needed for wheezing or shortness of breath. 8.5 g 1  . albuterol (PROVENTIL) (2.5 MG/3ML) 0.083% nebulizer solution INHALE 1 VIAL VIA NEBULIZER EVERY 6 HOURS AS NEEDED FOR WHEEZING OR SHORTNESS OF BREATH 75 mL 1  . amLODipine (NORVASC) 5 MG tablet TAKE 1 TABLET BY MOUTH DAILY 90 tablet 3  . Artificial Tear Solution (SOOTHE XP) SOLN Place 1 drop into both eyes 2 (two) times daily as needed (irritation).    Marland Kitchen aspirin EC 81 MG tablet Take 81 mg by mouth daily.    Marland Kitchen azelastine (ASTELIN)  0.1 % nasal spray Place 2 sprays into both nostrils 2 (two) times daily. Use in each nostril as directed (Patient taking differently: Place 2 sprays into both nostrils 2 (two) times daily as needed for rhinitis or allergies. Use in each nostril as directed) 30 mL 3  . diazepam (VALIUM) 5 MG tablet TAKE 1 TABLET BY MOUTH EVERY NIGHT AT BEDTIME AND MAY TAKE AN ADDITIONAL TABLET AS NEEDED FOR ANXIETY 60 tablet 5  . fluorometholone (FML) 0.1 % ophthalmic suspension Place 1 drop into both eyes as directed. Tapering off    . fluticasone (FLONASE) 50 MCG/ACT nasal spray Place 1 spray into both nostrils daily. (Patient taking differently: Place 1 spray into both nostrils as needed. ) 16 g 2  . Fluticasone-Umeclidin-Vilant (TRELEGY ELLIPTA) 100-62.5-25 MCG/INH AEPB Inhale 1 puff into the lungs daily. 1 each 11  . furosemide (LASIX) 20 MG tablet 1 tab po qd prn increased fluid collection in legs 30 tablet 3  . HYDROcodone-acetaminophen (NORCO/VICODIN) 5-325 MG tablet Take 1-2 tablets by mouth every 6 (six) hours as needed. 15 tablet 0  . lidocaine (LIDODERM) 5 % Place 1 patch onto the skin daily. Remove & Discard patch within 12 hours or as directed by MD 30 patch 0  . montelukast (SINGULAIR) 10 MG tablet Take 10 mg by mouth at bedtime.     Marland Kitchen oxyCODONE (ROXICODONE) 5 MG immediate release tablet Take 1 tablet (5 mg  total) by mouth every 6 (six) hours as needed for up to 15 doses for severe pain. 40 tablet 0  . Probiotic Product (PROBIOTIC DAILY PO) Take 1 capsule by mouth daily.     . roflumilast (DALIRESP) 500 MCG TABS tablet Take 1 tablet (500 mcg total) by mouth daily. 30 tablet 11  . SYNTHROID 88 MCG tablet TAKE 1 TABLET BY MOUTH DAILY 30 tablet 5  . triamcinolone cream (KENALOG) 0.1 % Apply 1 application topically 2 (two) times daily. 30 g 0  . venlafaxine (EFFEXOR) 50 MG tablet TAKE 1 TABLET BY MOUTH  TWICE DAILY 180 tablet 0  . diclofenac sodium (VOLTAREN) 1 % GEL Apply 4 g topically 4 (four) times daily.  (Patient not taking: Reported on 10/12/2018) 100 g 1  . diphenhydrAMINE (BENADRYL) 25 MG tablet Take 25 mg by mouth 3 (three) times daily as needed for allergies.    Marland Kitchen docusate sodium (COLACE) 100 MG capsule Take 100 mg by mouth 2 (two) times daily.     No current facility-administered medications on file prior to visit.     Allergies:  Allergies  Allergen Reactions  . Losartan Other (See Comments)    hyperkalemia  . Penicillins Hives, Swelling and Rash    Has patient had a PCN reaction causing immediate rash, facial/tongue/throat swelling, SOB or lightheadedness with hypotension: YES Has patient had a PCN reaction causing severe rash involving mucus membranes or skin necrosis: NO Has patient had a PCN reaction that required hospitalization NO Has patient had a PCN reaction occurring within the last 10 years: NO If all of the above answers are "NO", then may proceed with Cephalosporin use.   . Cefixime [Kdc:Cefixime] Other (See Comments)    unspecified  . Indocin [Indomethacin] Other (See Comments)    Painful tongue and throat  . Singulair [Montelukast Sodium]     Chest tightness   . Sulfa Antibiotics Other (See Comments)    unspecified  . Ace Inhibitors Other (See Comments)    cough  . Statins Other (See Comments)    Myalgias     Review of Systems:  CONSTITUTIONAL: No fevers, chills, night sweats, or weight loss.  EYES: No visual changes or eye pain ENT: No hearing changes.  No history of nose bleeds.   RESPIRATORY: No cough, wheezing and shortness of breath.   CARDIOVASCULAR: Negative for chest pain, and palpitations.   GI: Negative for abdominal discomfort, blood in stools or black stools.  No recent change in bowel habits.   GU:  No history of incontinence.   MUSCLOSKELETAL: +history of joint pain or swelling.  No myalgias.   SKIN: Negative for lesions, rash, and itching.   ENDOCRINE: Negative for cold or heat intolerance, polydipsia or goiter.   PSYCH:  No depression  or anxiety symptoms.   NEURO: As Above.   Vital Signs:  BP 110/82   Pulse 78   Ht 5' (1.524 m)   Wt 130 lb (59 kg)   SpO2 95%   BMI 25.39 kg/m   Neurological Exam: MENTAL STATUS including orientation to time, place, person, recent and remote memory, attention span and concentration, language, and fund of knowledge is normal.  Speech is not dysarthric.  CRANIAL NERVES: Normal conjugate, extra-ocular eye movements in all directions of gaze.  No ptosis.  Face is symmetric.   MOTOR:  Motor strength is 5/5 in all extremities, except 5-/5 bilateral finger abductors.  No atrophy, fasciculations or abnormal movements.  No pronator drift.  Tone is  normal.  Arthritic changes are present in the hands [facility] joint bilaterally)  MSRs:  Reflexes are brisk 3+/4 throughout except 1+/4 at the Achilles.  There is medial pectoralis reflex bilaterally and crossed adductors.  Plantar response is extensor on the right only.  SENSORY: Vibration and temperature is reduced distal to ankles bilaterally.  Pin prick and proprioception is normal.  Rhomberg testing is normal  COORDINATION/GAIT:  Normal finger-to- nose-finger.  Intact rapid alternating movements bilaterally.  Gait mildly wide-based and stable, unassisted.   Data: Lab Results  Component Value Date   TSH 1.01 05/26/2018   Lab Results  Component Value Date   HGBA1C 5.7 10/29/2011   NCS/EMG of the legs 10/12/2018: The electrophysiologic findings are consistent with a chronic and symmetric sensorimotor axonal polyneuropathy affecting the lower extremities.  IMPRESSION/PLAN: 1. Peripheral neuropathy affecting the feet, no risk factors such as diabetes, alcohol use, or family history.    - Check vitamin B12, folate, copper, vitamin B1, SPEP with IFE.    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  2.  Hyperreflexia  secondary to suspected cervical canal stenosis   - MRI cervical spine without contrast  Further recommendations pending results.  Greater than 50% of this 25 minute visit was spent in counseling, explanation of diagnosis, planning of further management, and coordination of care.   Thank you for allowing me to participate in patient's care.  If I can answer any additional questions, I would be pleased to do so.    Sincerely,    Donika K. Posey Pronto, DO

## 2018-10-13 NOTE — Telephone Encounter (Signed)
ATC pt, no answer. Will try back due nature of the call.

## 2018-10-14 NOTE — Telephone Encounter (Signed)
ATC pt, no answer. We have attempted to contact pt several times with no success or call back from pt. Per triage protocol, message will be closed.

## 2018-10-15 LAB — IMMUNOFIXATION ELECTROPHORESIS
IgG (Immunoglobin G), Serum: 530 mg/dL — ABNORMAL LOW (ref 600–1540)
IgM, Serum: 103 mg/dL (ref 50–300)
Immunoglobulin A: 123 mg/dL (ref 70–320)

## 2018-10-15 LAB — PROTEIN ELECTROPHORESIS, SERUM
Albumin ELP: 4 g/dL (ref 3.8–4.8)
Alpha 1: 0.3 g/dL (ref 0.2–0.3)
Alpha 2: 1 g/dL — ABNORMAL HIGH (ref 0.5–0.9)
Beta 2: 0.3 g/dL (ref 0.2–0.5)
Beta Globulin: 0.5 g/dL (ref 0.4–0.6)
Gamma Globulin: 0.6 g/dL — ABNORMAL LOW (ref 0.8–1.7)
Total Protein: 6.6 g/dL (ref 6.1–8.1)

## 2018-10-15 LAB — VITAMIN B1: Vitamin B1 (Thiamine): 14 nmol/L (ref 8–30)

## 2018-10-15 LAB — COPPER, SERUM: Copper: 141 ug/dL (ref 70–175)

## 2018-10-15 LAB — B12 AND FOLATE PANEL
Folate: 15.8 ng/mL
Vitamin B-12: 314 pg/mL (ref 200–1100)

## 2018-10-19 ENCOUNTER — Encounter: Payer: Self-pay | Admitting: Family Medicine

## 2018-10-19 ENCOUNTER — Other Ambulatory Visit: Payer: Self-pay

## 2018-10-19 NOTE — Patient Outreach (Signed)
[facility]) Care Management  10/19/2018   Priscilla Cantrell [birth year] [id number]  Subjective: Successful call to the patient.  Two patient identifiers given.  The patient states that she is feeling better.  She denies any pain or falls.  She states she is continuing to be careful around the home.  She denies any COPD symptoms.  She is wearing her oxygen on 2 liters around the home.  She is taking her medications as prescribed.  She states she was given clarification on her inhalers.  She should only be using the Trelegy and Daliresp.    She has seen the neurologist and was told that she has neuropathy in her feet.  She had her Flu shot on 10/05/18.  She has an appointment with Dr [name] on the 24th and a pulmonary function test on the 25th.    Current Medications:  Current Outpatient Medications  Medication Sig Dispense Refill  . albuterol (PROAIR HFA) 108 (90 Base) MCG/ACT inhaler Inhale 2 puffs into the lungs every 4 (four) hours as needed for wheezing or shortness of breath. 8.5 g 1  . albuterol (PROVENTIL) (2.5 MG/3ML) 0.083% nebulizer solution INHALE 1 VIAL VIA NEBULIZER EVERY 6 HOURS AS NEEDED FOR WHEEZING OR SHORTNESS OF BREATH 75 mL 1  . amLODipine (NORVASC) 5 MG tablet TAKE 1 TABLET BY MOUTH DAILY 90 tablet 3  . Artificial Tear Solution (SOOTHE XP) SOLN Place 1 drop into both eyes 2 (two) times daily as needed (irritation).    Marland Kitchen aspirin EC 81 MG tablet Take 81 mg by mouth daily.    Marland Kitchen azelastine (ASTELIN) 0.1 % nasal spray Place 2 sprays into both nostrils 2 (two) times daily. Use in each nostril as directed (Patient taking differently: Place 2 sprays into both nostrils 2 (two) times daily as needed for rhinitis or allergies. Use in each nostril as directed) 30 mL 3  . diazepam (VALIUM) 5 MG tablet TAKE 1 TABLET BY MOUTH EVERY NIGHT AT BEDTIME AND MAY TAKE AN ADDITIONAL TABLET AS NEEDED FOR ANXIETY 60 tablet 5  . diclofenac sodium (VOLTAREN) 1 % GEL Apply 4 g topically 4  (four) times daily. 100 g 1  . diphenhydrAMINE (BENADRYL) 25 MG tablet Take 25 mg by mouth 3 (three) times daily as needed for allergies.    Marland Kitchen docusate sodium (COLACE) 100 MG capsule Take 100 mg by mouth 2 (two) times daily.    . fluorometholone (FML) 0.1 % ophthalmic suspension Place 1 drop into both eyes as directed. Tapering off    . fluticasone (FLONASE) 50 MCG/ACT nasal spray Place 1 spray into both nostrils daily. (Patient taking differently: Place 1 spray into both nostrils as needed. ) 16 g 2  . Fluticasone-Umeclidin-Vilant (TRELEGY ELLIPTA) 100-62.5-25 MCG/INH AEPB Inhale 1 puff into the lungs daily. 1 each 11  . furosemide (LASIX) 20 MG tablet 1 tab po qd prn increased fluid collection in legs 30 tablet 3  . HYDROcodone-acetaminophen (NORCO/VICODIN) 5-325 MG tablet Take 1-2 tablets by mouth every 6 (six) hours as needed. 15 tablet 0  . lidocaine (LIDODERM) 5 % Place 1 patch onto the skin daily. Remove & Discard patch within 12 hours or as directed by MD 30 patch 0  . montelukast (SINGULAIR) 10 MG tablet Take 10 mg by mouth at bedtime.     Marland Kitchen oxyCODONE (ROXICODONE) 5 MG immediate release tablet Take 1 tablet (5 mg total) by mouth every 6 (six) hours as needed for up to 15 doses for severe pain.  40 tablet 0  . Probiotic Product (PROBIOTIC DAILY PO) Take 1 capsule by mouth daily.     . roflumilast (DALIRESP) 500 MCG TABS tablet Take 1 tablet (500 mcg total) by mouth daily. 30 tablet 11  . SYNTHROID 88 MCG tablet TAKE 1 TABLET BY MOUTH DAILY 30 tablet 5  . triamcinolone cream (KENALOG) 0.1 % Apply 1 application topically 2 (two) times daily. 30 g 0  . venlafaxine (EFFEXOR) 50 MG tablet TAKE 1 TABLET BY MOUTH  TWICE DAILY 180 tablet 0   No current facility-administered medications for this visit.     Functional Status:  In your present state of health, do you have any difficulty performing the following activities: 08/20/2018 12/08/2017  Hearing? N N  Vision? N N  Difficulty concentrating  or making decisions? N N  Walking or climbing stairs? N Y  Dressing or bathing? N N  Doing errands, shopping? N N  Some recent data might be hidden    Fall/Depression Screening: Fall Risk  10/19/2018 10/12/2018 09/24/2018  Falls in the past year? 0 1 0  Number falls in past yr: - 1 -  Injury with Fall? - 1 -  Comment - - -  Risk for fall due to : - - -  Risk for fall due to: Comment - - -  Follow up - - -  Comment - - -   PHQ 2/9 Scores 08/20/2018 08/02/2018 08/02/2018 09/04/2017 11/18/2016 10/13/2016 10/13/2016  PHQ - 2 Score 0 0 0 0 0 0 0  PHQ- 9 Score - - - 0 0 1 -    Assessment: Patient will continue to benefit from health coach outreach for disease management and support. [facility] CM Care Plan Problem One     Most Recent Value  [facility] Long Term Goal   In 90 days the patient will verbalize no admission to the hospital for COPD exacerbation  [facility] Long Term Goal Start Date  10/19/18  Interventions for Problem One Long Term Goal  Discussed signs and symptoms and COPD action plan,  encouraged continued medication adherence, encouraged the patient to continue to go to her appointments     Plan: RN Health Coach will contact patient in the month of December and patient agrees to next outreach.    Lazaro Arms RN, BSN, [place] Direct Dial:  [phone]  Fax: [phone]

## 2018-10-26 ENCOUNTER — Other Ambulatory Visit (HOSPITAL_COMMUNITY)
Admission: RE | Admit: 2018-10-26 | Discharge: 2018-10-26 | Disposition: A | Discharge status: Home / Self Care | Payer: Medicare Other | Source: Ambulatory Visit | Attending: Pulmonary Disease | Admitting: Pulmonary Disease

## 2018-10-26 DIAGNOSIS — Z20828 Contact with and (suspected) exposure to other viral communicable diseases: Secondary | ICD-10-CM | POA: Insufficient documentation

## 2018-10-26 DIAGNOSIS — Z01812 Encounter for preprocedural laboratory examination: Secondary | ICD-10-CM | POA: Insufficient documentation

## 2018-10-27 LAB — NOVEL CORONAVIRUS, NAA (HOSP ORDER, SEND-OUT TO REF LAB; TAT 18-24 HRS): SARS-CoV-2, NAA: NOT DETECTED

## 2018-10-28 ENCOUNTER — Ambulatory Visit: Payer: Medicare Other | Admitting: Pulmonary Disease

## 2018-10-29 ENCOUNTER — Other Ambulatory Visit: Payer: Self-pay

## 2018-10-29 ENCOUNTER — Ambulatory Visit (INDEPENDENT_AMBULATORY_CARE_PROVIDER_SITE_OTHER): Payer: Medicare Other | Admitting: Pulmonary Disease

## 2018-10-29 DIAGNOSIS — J449 Chronic obstructive pulmonary disease, unspecified: Secondary | ICD-10-CM | POA: Diagnosis not present

## 2018-10-29 LAB — PULMONARY FUNCTION TEST
DL/VA % pred: 51 %
DL/VA: 2.15 ml/min/mmHg/L
DLCO unc % pred: 44 %
DLCO unc: 7.25 ml/min/mmHg
FEF 25-75 Post: 0.46 L/sec
FEF 25-75 Pre: 0.43 L/sec
FEF2575-%Change-Post: 5 %
FEF2575-%Pred-Post: 40 %
FEF2575-%Pred-Pre: 38 %
FEV1-%Change-Post: 0 %
FEV1-%Pred-Post: 68 %
FEV1-%Pred-Pre: 67 %
FEV1-Post: 1.05 L
FEV1-Pre: 1.04 L
FEV1FVC-%Change-Post: 0 %
FEV1FVC-%Pred-Pre: 70 %
FEV6-%Change-Post: 0 %
FEV6-%Pred-Post: 100 %
FEV6-%Pred-Pre: 100 %
FEV6-Post: 1.98 L
FEV6-Pre: 1.97 L
FEV6FVC-%Change-Post: 0 %
FEV6FVC-%Pred-Post: 103 %
FEV6FVC-%Pred-Pre: 104 %
FVC-%Change-Post: 1 %
FVC-%Pred-Post: 97 %
FVC-%Pred-Pre: 96 %
FVC-Post: 2.03 L
FVC-Pre: 2.01 L
Post FEV1/FVC ratio: 52 %
Post FEV6/FVC ratio: 97 %
Pre FEV1/FVC ratio: 52 %
Pre FEV6/FVC Ratio: 98 %
RV % pred: 76 %
RV: 1.68 L
TLC % pred: 96 %
TLC: 4.29 L

## 2018-10-29 NOTE — Progress Notes (Signed)
Patient completed pulmonary function test today.  Pulmonary function test results are listed below:  10/29/2018-pulmonary function test-FVC 2.01 (96% predicted), postbronchodilator ratio 52, postbronchodilator FEV1 1.05 (68% predicted), no bronchodilator response, DLCO 7.25 (44% predicted)  Still showing COPD Gold stage II.  Still showing a reduced diffusion capacity likely related to known emphysema.  Keep scheduled follow-up with Dr. stood next week for this can be reviewed in more detail.  Wyn Quaker, FNP

## 2018-10-29 NOTE — Progress Notes (Signed)
Full PFT performed today. °

## 2018-11-01 ENCOUNTER — Other Ambulatory Visit: Payer: Self-pay

## 2018-11-01 ENCOUNTER — Encounter: Payer: Self-pay | Admitting: Pulmonary Disease

## 2018-11-01 ENCOUNTER — Ambulatory Visit (INDEPENDENT_AMBULATORY_CARE_PROVIDER_SITE_OTHER): Payer: Medicare Other | Admitting: Pulmonary Disease

## 2018-11-01 VITALS — BP 128/76 | HR 72 | Temp 98.2°F | Ht 60.0 in | Wt 131.2 lb

## 2018-11-01 DIAGNOSIS — J9611 Chronic respiratory failure with hypoxia: Secondary | ICD-10-CM | POA: Diagnosis not present

## 2018-11-01 DIAGNOSIS — Z23 Encounter for immunization: Secondary | ICD-10-CM | POA: Diagnosis not present

## 2018-11-01 DIAGNOSIS — J449 Chronic obstructive pulmonary disease, unspecified: Secondary | ICD-10-CM | POA: Diagnosis not present

## 2018-11-01 DIAGNOSIS — Z7189 Other specified counseling: Secondary | ICD-10-CM

## 2018-11-01 NOTE — Progress Notes (Signed)
[facility]  Chief Complaint  Patient presents with  . COPD - DIscuss PFT Results    Feels like breathing is worse since last visit.    Constitutional:  BP 128/76 (BP Location: Right Arm, Patient Position: Sitting, Cuff Size: Normal)   Pulse 72   Temp 98.2 F (36.8 C)   Ht 5' (1.524 m)   Wt 131 lb 3.2 oz (59.5 kg)   SpO2 95% Comment: on room air  BMI 25.62 kg/m   Past Medical History:  Chronic cystitis, DDD, Anxiety, Depression, C diff 2017 and 2018, Diverticulosis, GERD, HTN, Hypothyroidism, PNA, Shingles, Spinal stenosis  Brief Summary:  Priscilla Cantrell is a 83 y.o. female  former smoker with GOLD 4 COPD, ACE cough, and nocturnal hypoxemia.  She has been getting more winded with activity.  She tripped over her cat in the Summer, and then fell.  She had several rib fractures on the right.  This is slowly healing up, but still feels sore.  She isn't having much cough or sputum.  Doesn't have fever, hemoptysis, chest pain, or leg swelling.  Physical Exam:   Appearance - well kempt   ENMT - no sinus tenderness, no nasal discharge, no oral exudate  Neck - no masses, trachea midline, no thyromegaly, no elevation in JVP  Respiratory - normal appearance of chest wall, normal respiratory effort w/o accessory muscle use, no dullness on percussion, no wheezing or rales  CV - s1s2 regular rate and rhythm, no murmurs, no peripheral edema, radial pulses symmetric  GI - soft, non tender  Lymph - no adenopathy noted in neck and axillary areas  MSK - normal gait  Ext - no cyanosis, clubbing, or joint inflammation noted  Skin - no rashes, lesions, or ulcers  Neuro - normal strength, oriented x 3  Psych - normal mood and affect  Assessment/Plan:   GOLD 4 COPD with chronic bronchitis. - continue trelegy and prn albuterol - pneumovax booster today  Allergic rhinitis with upper airway cough. - continue astelin, flonase  Chronic  respiratory failure with hypoxia. - 2 liters oxygen at night and prn during the day with household activities   Patient Instructions  Pneumonia 23 vaccine today  Follow up in 6 months  A total of  26 minutes were spent face to face with the patient and more than half of that time involved counseling or coordination of care.  Chesley Mires, MD [facility] Pager: [phone] 11/01/2018, 9:59 AM  Flow Sheet     Pulmonary tests:  Spirometry 01/10/09>>FEV1 0.97(52%), FEV1% 47 March 2012>>Daliresp started RAST 05/04/17 >> negative, IgE 8 Spirometry 05/14/17 >> FEV1 0.91 (55%), FEV1% 48  Sleep tests:  PSG 11/14/11 >> AHI 0.3 ONO with RA 07/31/16 >>test time 6 hrs 41 min. Average SpO2 87%, low SpO2 74%. Spent 4 hrs 13 min with SpO2 <88%.  Cardiac tests:  Echo 08/30/14 >> EF 60 to [id number], grade 1 diastolic dysfx, PAS 24 mmHG  Medications:   Allergies as of 11/01/2018      Reactions   Losartan Other (See Comments)   hyperkalemia   Penicillins Hives, Swelling, Rash   Has patient had a PCN reaction causing immediate rash, facial/tongue/throat swelling, SOB or lightheadedness with hypotension: YES Has patient had a PCN reaction causing severe rash involving mucus membranes or skin necrosis: NO Has patient had a PCN reaction that required hospitalization NO Has patient had a PCN reaction occurring within the last 10 years: NO If  all of the above answers are "NO", then may proceed with Cephalosporin use.   Cefixime [kdc:cefixime] Other (See Comments)   unspecified   Indocin [indomethacin] Other (See Comments)   Painful tongue and throat   Singulair [montelukast Sodium]    Chest tightness    Sulfa Antibiotics Other (See Comments)   unspecified   Ace Inhibitors Other (See Comments)   cough   Statins Other (See Comments)   Myalgias      Medication List       Accurate as of November 01, 2018  9:59 AM. If you have any questions, ask your nurse or doctor.         STOP taking these medications   docusate sodium 100 MG capsule Commonly known as: COLACE Stopped by: Chesley Mires, MD   furosemide 20 MG tablet Commonly known as: LASIX Stopped by: Chesley Mires, MD   HYDROcodone-acetaminophen 5-325 MG tablet Commonly known as: NORCO/VICODIN Stopped by: Chesley Mires, MD   lidocaine 5 % Commonly known as: Lidoderm Stopped by: Chesley Mires, MD   oxyCODONE 5 MG immediate release tablet Commonly known as: Roxicodone Stopped by: Chesley Mires, MD     TAKE these medications   albuterol 108 (90 Base) MCG/ACT inhaler Commonly known as: ProAir HFA Inhale 2 puffs into the lungs every 4 (four) hours as needed for wheezing or shortness of breath.   albuterol (2.5 MG/3ML) 0.083% nebulizer solution Commonly known as: PROVENTIL INHALE 1 VIAL VIA NEBULIZER EVERY 6 HOURS AS NEEDED FOR WHEEZING OR SHORTNESS OF BREATH   amLODipine 5 MG tablet Commonly known as: NORVASC TAKE 1 TABLET BY MOUTH DAILY   aspirin EC 81 MG tablet Take 81 mg by mouth daily.   azelastine 0.1 % nasal spray Commonly known as: ASTELIN Place 2 sprays into both nostrils 2 (two) times daily. Use in each nostril as directed What changed:   when to take this  reasons to take this   diazepam 5 MG tablet Commonly known as: VALIUM TAKE 1 TABLET BY MOUTH EVERY NIGHT AT BEDTIME AND MAY TAKE AN ADDITIONAL TABLET AS NEEDED FOR ANXIETY   diclofenac sodium 1 % Gel Commonly known as: Voltaren Apply 4 g topically 4 (four) times daily.   diphenhydrAMINE 25 MG tablet Commonly known as: BENADRYL Take 25 mg by mouth 3 (three) times daily as needed for allergies.   fluorometholone 0.1 % ophthalmic suspension Commonly known as: FML Place 1 drop into both eyes as directed. Tapering off   fluticasone 50 MCG/ACT nasal spray Commonly known as: FLONASE Place 1 spray into both nostrils daily. What changed:   when to take this  reasons to take this   montelukast 10 MG tablet Commonly  known as: SINGULAIR Take 10 mg by mouth at bedtime.   PROBIOTIC DAILY PO Take 1 capsule by mouth daily.   roflumilast 500 MCG Tabs tablet Commonly known as: DALIRESP Take 1 tablet (500 mcg total) by mouth daily.   Soothe XP Soln Place 1 drop into both eyes 2 (two) times daily as needed (irritation).   Synthroid 88 MCG tablet Generic drug: levothyroxine TAKE 1 TABLET BY MOUTH DAILY   Trelegy Ellipta 100-62.5-25 MCG/INH Aepb Generic drug: Fluticasone-Umeclidin-Vilant Inhale 1 puff into the lungs daily.   triamcinolone cream 0.1 % Commonly known as: KENALOG Apply 1 application topically 2 (two) times daily.   venlafaxine 50 MG tablet Commonly known as: EFFEXOR TAKE 1 TABLET BY MOUTH  TWICE DAILY       Past Surgical History:  She  has a past surgical history that includes Cholecystectomy (2001); Tubal ligation (1974); Abdominal hysterectomy (1978); Breast cyst excision; Appendectomy; Tonsillectomy; Colonoscopy (04/2005; 12/06/2015); transthoracic echocardiogram (08/2014); Total hip arthroplasty (Right, 11/01/2014); Hip Closed Reduction (Right, 12/25/2015); DEXA (05/18/2017); Total hip arthroplasty (Left, 12/08/2017); and NCV/EMS (10/2018).  Family History:  Her family history includes Cancer in her maternal aunt and paternal grandfather; Cirrhosis in her father; Diabetes in her daughter; Goiter in her mother; Hernia in her paternal grandmother; Hypertension in her paternal grandmother; Psoriasis in her father; Stroke in her maternal grandfather and paternal grandmother.  Social History:  She  reports that she quit smoking about 28 years ago. Her smoking use included cigarettes. She started smoking about 62 years ago. She has a 51.00 pack-year smoking history. She has never used smokeless tobacco. She reports previous alcohol use. She reports that she does not use drugs.

## 2018-11-01 NOTE — Patient Instructions (Signed)
Pneumonia 23 vaccine today  Follow up in 6 months

## 2018-11-02 ENCOUNTER — Other Ambulatory Visit: Payer: Self-pay

## 2018-11-02 ENCOUNTER — Encounter: Payer: Self-pay | Admitting: Gynecology

## 2018-11-02 ENCOUNTER — Ambulatory Visit (INDEPENDENT_AMBULATORY_CARE_PROVIDER_SITE_OTHER): Payer: Medicare Other | Admitting: Gynecology

## 2018-11-02 VITALS — BP 118/66 | Ht 60.0 in | Wt 132.0 lb

## 2018-11-02 DIAGNOSIS — N993 Prolapse of vaginal vault after hysterectomy: Secondary | ICD-10-CM | POA: Diagnosis not present

## 2018-11-02 NOTE — Progress Notes (Signed)
    ELNER SULA [birth year] [id number]        83 y.o.  G1P1 presents complaining of something protruding from the vagina intermittently over the past year or so.  Most of the time it occurs whenever she is having issues with coughing or straining.  No pain associated with this.  No urinary issues to include pain with urination or any incontinence.  No issues with bowel movements to include constipation, diarrhea or stool trapping.  Status post TAH a number of years ago for which the patient cannot remember the indication but states that it was not serious.  Past medical history,surgical history, problem list, medications, allergies, family history and social history were all reviewed and documented in the EPIC chart.  Directed ROS with pertinent positives and negatives documented in the history of present illness/assessment and plan.  Exam: Caryn Bee assistant Vitals:   11/02/18 1356  BP: 118/66  Weight: 132 lb (59.9 kg)  Height: 5' (1.524 m)   General appearance:  Normal Abdomen soft nontender without masses guarding rebound Pelvic external BUS vagina with atrophic changes.  Vaginal cuff prolapse noted with straining to the lower third of the vaginal canal.  No significant cystocele.  No evidence of irritation or erosion to the vaginal mucosa.  Bimanual without masses or tenderness.  Rectal exam is unremarkable without significant rectocele.  Assessment/Plan:  83 y.o. G1P1 with vaginal cuff prolapse following hysterectomy.  Patient notes that it protrudes from the vagina occasionally with episodes of straining but otherwise is not bothersome to her.  She digitally replaces it without difficulty.  Options for management reviewed to include continuing with what she is doing with digital replacement as needed, trial of pessary and surgery.  The pros and cons of each approach reviewed.  At this point as it is not overly bothersome to the patient she prefers just to monitor and keep doing what  she is doing.  She will follow-up if the prolapse increases and is causing her distress and will consider pessary placement.  Anastasio Auerbach MD, 2:28 PM 11/02/2018

## 2018-11-02 NOTE — Patient Instructions (Signed)
Follow-up with any issues concerning the prolapse

## 2018-11-03 ENCOUNTER — Ambulatory Visit (INDEPENDENT_AMBULATORY_CARE_PROVIDER_SITE_OTHER): Payer: Medicare Other | Admitting: Family Medicine

## 2018-11-03 ENCOUNTER — Encounter: Payer: Self-pay | Admitting: Family Medicine

## 2018-11-03 VITALS — BP 125/72 | HR 73 | Temp 98.3°F | Resp 16 | Ht 60.0 in | Wt 133.1 lb

## 2018-11-03 DIAGNOSIS — F411 Generalized anxiety disorder: Secondary | ICD-10-CM

## 2018-11-03 DIAGNOSIS — Z8781 Personal history of (healed) traumatic fracture: Secondary | ICD-10-CM

## 2018-11-03 DIAGNOSIS — F99 Mental disorder, not otherwise specified: Secondary | ICD-10-CM | POA: Diagnosis not present

## 2018-11-03 DIAGNOSIS — F5105 Insomnia due to other mental disorder: Secondary | ICD-10-CM | POA: Diagnosis not present

## 2018-11-03 DIAGNOSIS — F3340 Major depressive disorder, recurrent, in remission, unspecified: Secondary | ICD-10-CM | POA: Diagnosis not present

## 2018-11-03 MED ORDER — VENLAFAXINE HCL 50 MG PO TABS: ORAL_TABLET | ORAL | 3 refills | Status: DC

## 2018-11-03 NOTE — Progress Notes (Signed)
OFFICE VISIT  11/03/2018   CC:  Chief Complaint  Patient presents with  . Depression    with anxiety is better. took medication today  . Chest Pain    wearing a bra is not comfortable    HPI:    Patient is a 83 y.o. Caucasian female who presents for 4 wk f/u rib fracture pain and mood. Last visit she had increased her effexor to two 75mg  tabs per day and increased her valium to 2 tabs per day.  I encouraged her to continue taking these meds like this.  Feeling better still and taking meds as stated above. Takes 1-2 tylenol + an aleve hs for pain before bed and it allows her to sleep better. No vicodin or oxycodone.  Says she is not really even tender anymore in the area where she broke her rib.  Has bladder prolapse, GYN saw her and said that NOTHING should be done, and pt is happy with this.  Has C spine MRI planned after she saw the neurologist regarding pts peripheral neuropathy symptoms.  ROS: no CP, no SOB and no wheezing and no cough today.  No dizziness, no HAs, no rashes, no melena/hematochezia.  No polyuria or polydipsia.     Past Medical History:  Diagnosis Date  . Allergic rhinitis    with upper airway cough: as of 07/2017 pulm f/u she was instructed to use astelin, flonase, and saline more consistently  . BPPV (benign paroxysmal positional vertigo) 03/2018  . Chest pain, non-cardiac    Cardiac CT showed no signif obstructive dz, mildly elevated calcium level (Dr. [name]).  . Chronic cystitis with hematuria    [name]  . Chronic hypoxemic respiratory failure ([place]) 02/02/2015   2L oxygen 24/7  . COPD (chronic obstructive pulmonary disease) ([facility])    GOLD 4.  spirometry 01/10/09 FEV 0.97(52%), FEV1% 47.  With chronic bronchitis as of 07/2017.  . DDD (degenerative disc disease), cervical    1998 MRI C5-6 impingemt (left)  . DDD (degenerative disc disease), lumbar   . Depression with anxiety 04/15/2011  . Diarrheal disease Summer 2017   ? refractor C  diff vs post infectious diarrhea predominant syndrome--GI told her to avoid lactose, sorbitol, and caffeine (08/30/15--[name]).  As of 10/05/15 pt reports GI dx'd her with C diff and rx'd more flagyl and she is also on cholestyramine.  As of 02/2016, pt's sx's resolved completely.  . Diverticulosis of colon    Procto '97 and colonoscopy 2002  . Dysplastic nevus of upper extremity 04/2014   R tricep ([name])  . Edema of both lower extremities due to peripheral venous insufficiency    Varicosities bilat, swelling L>R.  R baker's cyst.  Got vein clinic eval 02/2018->sclerotherapy recommended but since no hemorrhage or ulceration, insurance will not cover the procedure.  . Family history of adverse reaction to anesthesia    mother died when patient age 57 receiving anesthesia for thyroid surgery  . Fibrocystic breast disease    w/fibroadenoma.  Bx's showed NO atypia (Dr. Margot Chimes).  Mammo neg 2008.  Marland Kitchen GERD (gastroesophageal reflux disease)   . History of double vision    Ophthalmologist, Dr. Herbert Deaner, is further evaluating this with MRI  orbits and limited brain (myesthenia gravis testing neg 07/2010)  . History of home oxygen therapy    uses 2 liters at hs  . Hypertension    EKG 03/2010 normal  . Hypothyroidism    Hashimoto's, dx'd 1989  . Lesion of  right native kidney 2017   found on CT 02/2015.  F/u MRI 03/31/15 showed it to be smaller and less likely of any significance but repeat CT renal protocol in 80mo was recommended by radiology.  This was done 10/26/15 and showed no interval change in the lesion, but b/c the lesion enhances with contrast, radiol rec'd urol consult (b/c pap renal RCC could not be excluded).  Saw urol 03/06/16--stable on u/s 09/2016 and 09/2017.  . Numbness in feet 04/2018   S1 distribution bilat, c/w spinal stenosis sensory neuropathy (no pain). 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  Labs and L spine MRI planned as  next step.  . Osteoarthritis, hip, bilateral    she is s/p bilat THA as of 02/2018.  Marland Kitchen Pneumonia   . Postmenopausal atrophic vaginitis    improved with estrace 2 X/week.  . Pulmonary nodule    CT chest 12/10, June 2011.  No nodule on CT 06/2010.  Marland Kitchen Recurrent Clostridium difficile diarrhea    Episode 09/19/2016: resolved with prolonged course of flagyl.  11/2016 recurrence treated with 10d of Dificid (Fidaxomicin) by GI (Dr. Earlean Shawl).  . Recurrent UTI    likely related to pt's atrophic vaginitis.  Urol started vaginal estrace 03/2016.  . Rib fracture 09/2018   R 7th, laterally-->sustained when her dog pulled her over and she fell.  . Shingles 02/2014   R abd/flank  . Spinal stenosis of lumbar region with neurogenic claudication    2008 MRI (L3-4, L4-5, L5-S1).  +S1 distribution sensory loss bilat    Past Surgical History:  Procedure Laterality Date  . ABDOMINAL HYSTERECTOMY  1978   no BSO per pt--nonmalignant reasons  . APPENDECTOMY    . BREAST CYST EXCISION     Last screening mammogram 10/2010 was normal.  . CHOLECYSTECTOMY  2001  . COLONOSCOPY  04/2005; 12/06/2015   2007 NORMAL.  2017 adenomatous polyp x 1.  Mod sigmoid diverticulosis (Dr. Earlean Shawl).    Marland Kitchen DEXA  05/18/2017   NORMAL (T-score 0.1)  . HIP CLOSED REDUCTION Right 12/25/2015   Procedure: CLOSED REDUCTION HIP;  Surgeon: Nicholes Stairs, MD;  Location: [facility];  Service: Orthopedics;  Laterality: Right;  . NCV/EMS  10/2018   chronic and symmetric sensorimotor axonal polyneuropathy affecting the lower extremities  . TONSILLECTOMY    . TOTAL HIP ARTHROPLASTY Right 11/01/2014   Procedure: RIGHT TOTAL HIP ARTHROPLASTY;  Surgeon: Latanya Maudlin, MD;  Location: [facility];  Service: Orthopedics;  Laterality: Right;  . TOTAL HIP ARTHROPLASTY Left 12/08/2017   Procedure: LEFT TOTAL HIP ARTHROPLASTY ANTERIOR APPROACH;  Surgeon: Paralee Cancel, MD;  Location: [facility];  Service: Orthopedics;  Laterality: Left;  70 mins  . TRANSTHORACIC  ECHOCARDIOGRAM  08/2014   EF 60-65%, grade I diast dysfxn  . TUBAL LIGATION  1974    Outpatient Medications Prior to Visit  Medication Sig Dispense Refill  . albuterol (PROAIR HFA) 108 (90 Base) MCG/ACT inhaler Inhale 2 puffs into the lungs every 4 (four) hours as needed for wheezing or shortness of breath. 8.5 g 1  . albuterol (PROVENTIL) (2.5 MG/3ML) 0.083% nebulizer solution INHALE 1 VIAL VIA NEBULIZER EVERY 6 HOURS AS NEEDED FOR WHEEZING OR SHORTNESS OF BREATH 75 mL 1  . amLODipine (NORVASC) 5 MG tablet TAKE 1 TABLET BY MOUTH DAILY 90 tablet 3  . Artificial Tear Solution (SOOTHE XP) SOLN Place 1 drop into both eyes 2 (two) times daily as needed (irritation).    Marland Kitchen aspirin EC  81 MG tablet Take 81 mg by mouth daily.    Marland Kitchen azelastine (ASTELIN) 0.1 % nasal spray Place 2 sprays into both nostrils 2 (two) times daily. Use in each nostril as directed (Patient taking differently: Place 2 sprays into both nostrils 2 (two) times daily as needed for rhinitis or allergies. Use in each nostril as directed) 30 mL 3  . diazepam (VALIUM) 5 MG tablet TAKE 1 TABLET BY MOUTH EVERY NIGHT AT BEDTIME AND MAY TAKE AN ADDITIONAL TABLET AS NEEDED FOR ANXIETY 60 tablet 5  . diphenhydrAMINE (BENADRYL) 25 MG tablet Take 25 mg by mouth 3 (three) times daily as needed for allergies.    . fluorometholone (FML) 0.1 % ophthalmic suspension Place 1 drop into both eyes as directed. Tapering off    . fluticasone (FLONASE) 50 MCG/ACT nasal spray Place 1 spray into both nostrils daily. (Patient taking differently: Place 1 spray into both nostrils as needed. ) 16 g 2  . Fluticasone-Umeclidin-Vilant (TRELEGY ELLIPTA) 100-62.5-25 MCG/INH AEPB Inhale 1 puff into the lungs daily. 1 each 11  . montelukast (SINGULAIR) 10 MG tablet Take 10 mg by mouth at bedtime.     . Probiotic Product (PROBIOTIC DAILY PO) Take 1 capsule by mouth daily.     . roflumilast (DALIRESP) 500 MCG TABS tablet Take 1 tablet (500 mcg total) by mouth daily. 30 tablet  11  . SYNTHROID 88 MCG tablet TAKE 1 TABLET BY MOUTH DAILY 30 tablet 5  . triamcinolone cream (KENALOG) 0.1 % Apply 1 application topically 2 (two) times daily. 30 g 0  . venlafaxine (EFFEXOR) 50 MG tablet TAKE 1 TABLET BY MOUTH  TWICE DAILY 180 tablet 0  . diclofenac sodium (VOLTAREN) 1 % GEL Apply 4 g topically 4 (four) times daily. (Patient not taking: Reported on 11/03/2018) 100 g 1   No facility-administered medications prior to visit.     Allergies  Allergen Reactions  . Losartan Other (See Comments)    hyperkalemia  . Penicillins Hives, Swelling and Rash    Has patient had a PCN reaction causing immediate rash, facial/tongue/throat swelling, SOB or lightheadedness with hypotension: YES Has patient had a PCN reaction causing severe rash involving mucus membranes or skin necrosis: NO Has patient had a PCN reaction that required hospitalization NO Has patient had a PCN reaction occurring within the last 10 years: NO If all of the above answers are "NO", then may proceed with Cephalosporin use.   . Cefixime [Kdc:Cefixime] Other (See Comments)    unspecified  . Indocin [Indomethacin] Other (See Comments)    Painful tongue and throat  . Singulair [Montelukast Sodium]     Chest tightness   . Sulfa Antibiotics Other (See Comments)    unspecified  . Ace Inhibitors Other (See Comments)    cough  . Statins Other (See Comments)    Myalgias     ROS As per HPI  PE: Blood pressure 125/72, pulse 73, temperature 98.3 F (36.8 C), temperature source Temporal, resp. rate 16, height 5' (1.524 m), weight 133 lb 2 oz (60.4 kg), SpO2 96 %. Body mass index is 26 kg/m. Pt examined with Jacklynn Ganong, CMA, as chaperone.  Gen: Alert, well appearing.  Patient is oriented to person, place, time, and situation. AFFECT: pleasant, lucid thought and speech. CV: RRR, no m/r/g.   LUNGS: CTA bilat, nonlabored resps, good aeration in all lung fields. EXT: no clubbing or cyanosis.  No edema on R,  trace pitting on left.  A few non-inflamed varicosities  are visible on left LL.  Small patch of stasis dermatitis on L ankle.  No warmth or tenderness. Rib cage all nontender, no deformity.    LABS:    Chemistry      Component Value Date/Time   NA 142 05/26/2018 0952   K 3.9 05/26/2018 0952   CL 106 05/26/2018 0952   CO2 26 05/26/2018 0952   BUN 16 05/26/2018 0952   CREATININE 0.82 05/26/2018 0952   CREATININE 0.84 02/27/2016 1533      Component Value Date/Time   CALCIUM 9.4 05/26/2018 0952   ALKPHOS 91 05/26/2018 0952   AST 12 05/26/2018 0952   ALT 11 05/26/2018 0952   BILITOT 0.6 05/26/2018 0952       IMPRESSION AND PLAN:  1) Hx of R 7th rib fracture 09/2018: her pain is essentially all gone and she will continue to use tylenol as needed. No longer needing any opioids.  2) Recurrent MDD + GAD with anxiety-related insomnia->improvement continues since she got back on appropriate doses of effexor and valium. RF'd effexor 50 mg bid today. No rx for valium was needed today.  3) COPD, with hx of chronic hypoxic RF->doing well, wearing oxygen some hs and sometimes with prolonged walking and/or exertional activity.  F/u with [name] as appropriate.  4) Peripheral neuropathy sx's: she is undergoing w/u with Dr. Posey Pronto in neurology.  Next step is C spine MRI.  An After Visit Summary was printed and given to the patient.  FOLLOW UP: No follow-ups on file.  Signed:  Crissie Sickles, MD           11/03/2018

## 2018-11-06 ENCOUNTER — Encounter: Payer: Self-pay | Admitting: Family Medicine

## 2018-11-08 ENCOUNTER — Other Ambulatory Visit: Payer: Self-pay

## 2018-11-08 MED ORDER — MONTELUKAST SODIUM 10 MG PO TABS: 10.0000 mg | ORAL_TABLET | Freq: Every day | ORAL | 3 refills | Status: DC

## 2018-11-11 ENCOUNTER — Encounter: Payer: Self-pay | Admitting: Gynecology

## 2018-11-12 ENCOUNTER — Other Ambulatory Visit: Payer: Medicare Other

## 2018-11-15 DIAGNOSIS — H532 Diplopia: Secondary | ICD-10-CM | POA: Diagnosis not present

## 2018-11-15 DIAGNOSIS — H35373 Puckering of macula, bilateral: Secondary | ICD-10-CM | POA: Diagnosis not present

## 2018-11-15 DIAGNOSIS — Z961 Presence of intraocular lens: Secondary | ICD-10-CM | POA: Diagnosis not present

## 2018-11-15 DIAGNOSIS — H04123 Dry eye syndrome of bilateral lacrimal glands: Secondary | ICD-10-CM | POA: Diagnosis not present

## 2018-11-15 LAB — HM DIABETES EYE EXAM

## 2018-11-18 DIAGNOSIS — Z96643 Presence of artificial hip joint, bilateral: Secondary | ICD-10-CM | POA: Diagnosis not present

## 2018-11-18 DIAGNOSIS — Z96642 Presence of left artificial hip joint: Secondary | ICD-10-CM | POA: Diagnosis not present

## 2018-11-18 DIAGNOSIS — Z471 Aftercare following joint replacement surgery: Secondary | ICD-10-CM | POA: Diagnosis not present

## 2018-11-18 DIAGNOSIS — Z96641 Presence of right artificial hip joint: Secondary | ICD-10-CM | POA: Diagnosis not present

## 2018-11-18 DIAGNOSIS — M5136 Other intervertebral disc degeneration, lumbar region: Secondary | ICD-10-CM | POA: Diagnosis not present

## 2018-11-30 ENCOUNTER — Encounter: Payer: Self-pay | Admitting: Family Medicine

## 2018-12-03 ENCOUNTER — Other Ambulatory Visit: Payer: Self-pay

## 2018-12-03 ENCOUNTER — Ambulatory Visit
Admission: RE | Admit: 2018-12-03 | Discharge: 2018-12-03 | Disposition: A | Discharge status: Home / Self Care | Payer: Medicare Other | Source: Ambulatory Visit | Attending: Neurology | Admitting: Neurology

## 2018-12-03 ENCOUNTER — Telehealth: Payer: Self-pay | Admitting: Neurology

## 2018-12-03 DIAGNOSIS — R292 Abnormal reflex: Secondary | ICD-10-CM

## 2018-12-03 DIAGNOSIS — M4802 Spinal stenosis, cervical region: Secondary | ICD-10-CM | POA: Diagnosis not present

## 2018-12-03 DIAGNOSIS — G629 Polyneuropathy, unspecified: Secondary | ICD-10-CM

## 2018-12-03 DIAGNOSIS — R202 Paresthesia of skin: Secondary | ICD-10-CM

## 2018-12-03 DIAGNOSIS — G959 Disease of spinal cord, unspecified: Secondary | ICD-10-CM

## 2018-12-03 NOTE — Telephone Encounter (Signed)
MRI results discussed with patient which shows degenerative changes of the cervical spine including by foraminal stenosis at C5-6 and cervical canal stenosis at 4-5 and 5-6.  She currently denies arm weakness, neck pain, or and paresthesias.  Neck PT was declined.  If she develops any new neurological symptoms suggesting myelopathy, low threshold to refer to spine surgery.   She is also complaining about gait instability.  EMG did confirm the presence of neuropathy in the legs.  Is interested in physical therapy for balance training and prefers to go to [facility].

## 2018-12-06 ENCOUNTER — Other Ambulatory Visit: Payer: Self-pay

## 2018-12-06 DIAGNOSIS — G629 Polyneuropathy, unspecified: Secondary | ICD-10-CM

## 2018-12-06 NOTE — Telephone Encounter (Signed)
Referral placed to [facility] Physical therapy, notes and order and insurance card.[phone], office number and fax to [phone].

## 2018-12-14 ENCOUNTER — Telehealth: Payer: Self-pay | Admitting: Neurology

## 2018-12-14 NOTE — Telephone Encounter (Signed)
Pt notified and clarified, patient has an appt tomorrow to start.

## 2018-12-14 NOTE — Telephone Encounter (Signed)
Patient left msg with after hours about having a referral for rehab and is needing some clarification. Thanks!

## 2018-12-15 DIAGNOSIS — R2689 Other abnormalities of gait and mobility: Secondary | ICD-10-CM | POA: Diagnosis not present

## 2018-12-17 DIAGNOSIS — R2689 Other abnormalities of gait and mobility: Secondary | ICD-10-CM | POA: Diagnosis not present

## 2018-12-20 ENCOUNTER — Other Ambulatory Visit: Payer: Self-pay | Admitting: *Deleted

## 2018-12-20 DIAGNOSIS — R2689 Other abnormalities of gait and mobility: Secondary | ICD-10-CM | POA: Diagnosis not present

## 2018-12-20 NOTE — Patient Outreach (Signed)
[facility]) Care Management  12/20/2018  Priscilla Cantrell 1935-04-07 [id number]   RN Health Coach received call from [place]. Per Traci the patient had left a message that she is about to run out of her inhalers and does not know how to reorder. Patient had been working with [place] notified Pharmacist to make her aware of the above information.  [place] Care Management [phone]

## 2018-12-22 DIAGNOSIS — R2689 Other abnormalities of gait and mobility: Secondary | ICD-10-CM | POA: Diagnosis not present

## 2018-12-27 DIAGNOSIS — R2689 Other abnormalities of gait and mobility: Secondary | ICD-10-CM | POA: Diagnosis not present

## 2018-12-29 DIAGNOSIS — R2689 Other abnormalities of gait and mobility: Secondary | ICD-10-CM | POA: Diagnosis not present

## 2019-01-03 DIAGNOSIS — R2689 Other abnormalities of gait and mobility: Secondary | ICD-10-CM | POA: Diagnosis not present

## 2019-01-05 DIAGNOSIS — R2689 Other abnormalities of gait and mobility: Secondary | ICD-10-CM | POA: Diagnosis not present

## 2019-01-06 DIAGNOSIS — L309 Dermatitis, unspecified: Secondary | ICD-10-CM | POA: Diagnosis not present

## 2019-01-06 DIAGNOSIS — D229 Melanocytic nevi, unspecified: Secondary | ICD-10-CM | POA: Diagnosis not present

## 2019-01-06 DIAGNOSIS — L821 Other seborrheic keratosis: Secondary | ICD-10-CM | POA: Diagnosis not present

## 2019-01-10 DIAGNOSIS — R2689 Other abnormalities of gait and mobility: Secondary | ICD-10-CM | POA: Diagnosis not present

## 2019-01-11 NOTE — Telephone Encounter (Signed)
Please advise on recommendations for physical therapy

## 2019-01-11 NOTE — Telephone Encounter (Signed)
She may continue PT as long as her insurance will continue to cover it.

## 2019-01-11 NOTE — Telephone Encounter (Signed)
Patient called wanting to know how much longer she needs to do rehabilitation. She said she is doing well and wants to continue. She scheduled a follow up virtual visit on 02/11/19 at 10:50 AM.  Aleatha Borer PT

## 2019-01-11 NOTE — Telephone Encounter (Signed)
Pt called and advised 

## 2019-01-12 DIAGNOSIS — R2689 Other abnormalities of gait and mobility: Secondary | ICD-10-CM | POA: Diagnosis not present

## 2019-01-17 DIAGNOSIS — R2689 Other abnormalities of gait and mobility: Secondary | ICD-10-CM | POA: Diagnosis not present

## 2019-01-19 ENCOUNTER — Other Ambulatory Visit: Payer: Self-pay | Admitting: *Deleted

## 2019-01-24 ENCOUNTER — Other Ambulatory Visit: Payer: Self-pay

## 2019-01-24 MED ORDER — SYNTHROID 88 MCG PO TABS: 88.0000 ug | ORAL_TABLET | Freq: Every day | ORAL | 5 refills | Status: DC

## 2019-01-25 ENCOUNTER — Other Ambulatory Visit: Payer: Self-pay

## 2019-01-25 MED ORDER — MONTELUKAST SODIUM 10 MG PO TABS: 10.0000 mg | ORAL_TABLET | Freq: Every day | ORAL | 0 refills | Status: DC

## 2019-01-25 MED ORDER — AMLODIPINE BESYLATE 5 MG PO TABS: 5.0000 mg | ORAL_TABLET | Freq: Every day | ORAL | 0 refills | Status: DC

## 2019-01-25 MED ORDER — DIAZEPAM 5 MG PO TABS: ORAL_TABLET | ORAL | 5 refills | Status: DC

## 2019-01-25 MED ORDER — SYNTHROID 88 MCG PO TABS: 88.0000 ug | ORAL_TABLET | Freq: Every day | ORAL | 5 refills | Status: DC

## 2019-01-25 NOTE — Telephone Encounter (Signed)
Requesting: [place] Contract: n/a UDS: n/a Last Visit: 11/03/18 Next Visit: advised to f/u 3 mo. Last Refill: 08/23/18 (60,5)  Please Advise. Medication pending, pt would like to use mail order instead.

## 2019-01-26 ENCOUNTER — Telehealth: Payer: Self-pay | Admitting: Family Medicine

## 2019-01-26 ENCOUNTER — Other Ambulatory Visit: Payer: Self-pay

## 2019-01-26 NOTE — Telephone Encounter (Signed)
[name] brother in law has tested positive for COVID.  She was not around him however she has been in the same house/surroundings. She reports she has no symptoms but she is very scared.  She has cried over night.  IF she should start to have any symptoms, when / what is time frame would she start having symptoms.    Patient can be contacted at [phone]

## 2019-01-26 NOTE — Telephone Encounter (Signed)
[name] brother in law tested positive and now his wife is also showing symptoms. Patient was advised to quarantine for the next 10 days. She does not have any symptoms but was within the same house on 12/19. Any other recommendations?

## 2019-01-26 NOTE — Telephone Encounter (Signed)
Patient advised and voiced understanding.  

## 2019-01-26 NOTE — Telephone Encounter (Signed)
Received a refill request for furosemide, pt's chart shows this med was discontinued on 11/01/18 due to pt reported as not taking. She was given (30,3) on 09/23/18. She has 13 tabs left. She has been having leg swelling for 3-4 weeks and has went back to taking this med.  Please advise if pt should continue, will find out what pharmacy she would like to use if this med is continued.

## 2019-01-26 NOTE — Telephone Encounter (Signed)
If she is infected then she will show symptoms by 7 days after exposure. So if she has not started getting any symptoms by 12/26 then she should be in the clear.-thx

## 2019-01-27 ENCOUNTER — Encounter: Payer: Self-pay | Admitting: Family Medicine

## 2019-01-27 ENCOUNTER — Other Ambulatory Visit: Payer: Self-pay | Admitting: Family Medicine

## 2019-01-27 MED ORDER — FUROSEMIDE 20 MG PO TABS: ORAL_TABLET | ORAL | 3 refills | Status: DC

## 2019-01-27 NOTE — Telephone Encounter (Signed)
Patient was advised that Rx sent to local pharmacy.

## 2019-01-27 NOTE — Progress Notes (Signed)
I sent lasix to [facility] in [place] just now. OK to use this med AS NEEDED for increased legs swelling.

## 2019-01-27 NOTE — Telephone Encounter (Signed)
OK, lasix sent to local pharmacy.

## 2019-01-31 NOTE — Patient Outreach (Addendum)
[facility]) Care Management  [place]  01/19/2019 Late Entry  Priscilla Cantrell 1935-03-19 [id number]  RN Health Coach telephone call to patient.  [place] compliance verified. Per patient she is doing fine.. Patient wears oxygen when doing strenuous activity such as mopping floors ,sweeping and making up bed. Patient is aware of purse lip breathing. Patient has been taking PT for balance and strengthening.  Per patient if she over exerts herself she has to stop and catch her breath. Patient has agreed to follow up outreach calls.   Encounter Medications:  Outpatient Encounter Medications as of 01/19/2019  Medication Sig  . albuterol (PROAIR HFA) 108 (90 Base) MCG/ACT inhaler Inhale 2 puffs into the lungs every 4 (four) hours as needed for wheezing or shortness of breath.  Marland Kitchen albuterol (PROVENTIL) (2.5 MG/3ML) 0.083% nebulizer solution INHALE 1 VIAL VIA NEBULIZER EVERY 6 HOURS AS NEEDED FOR WHEEZING OR SHORTNESS OF BREATH  . Artificial Tear Solution (SOOTHE XP) SOLN Place 1 drop into both eyes 2 (two) times daily as needed (irritation).  Marland Kitchen aspirin EC 81 MG tablet Take 81 mg by mouth daily.  Marland Kitchen azelastine (ASTELIN) 0.1 % nasal spray Place 2 sprays into both nostrils 2 (two) times daily. Use in each nostril as directed (Patient taking differently: Place 2 sprays into both nostrils 2 (two) times daily as needed for rhinitis or allergies. Use in each nostril as directed)  . diclofenac sodium (VOLTAREN) 1 % GEL Apply 4 g topically 4 (four) times daily. (Patient not taking: Reported on 11/03/2018)  . diphenhydrAMINE (BENADRYL) 25 MG tablet Take 25 mg by mouth 3 (three) times daily as needed for allergies.  . fluorometholone (FML) 0.1 % ophthalmic suspension Place 1 drop into both eyes as directed. Tapering off  . fluticasone (FLONASE) 50 MCG/ACT nasal spray Place 1 spray into both nostrils daily. (Patient taking differently: Place 1 spray into both nostrils as needed. )  .  Fluticasone-Umeclidin-Vilant (TRELEGY ELLIPTA) 100-62.5-25 MCG/INH AEPB Inhale 1 puff into the lungs daily.  . Probiotic Product (PROBIOTIC DAILY PO) Take 1 capsule by mouth daily.   . roflumilast (DALIRESP) 500 MCG TABS tablet Take 1 tablet (500 mcg total) by mouth daily.  Marland Kitchen triamcinolone cream (KENALOG) 0.1 % Apply 1 application topically 2 (two) times daily.  Marland Kitchen venlafaxine (EFFEXOR) 50 MG tablet TAKE 1 TABLET BY MOUTH  TWICE DAILY  . [DISCONTINUED] amLODipine (NORVASC) 5 MG tablet TAKE 1 TABLET BY MOUTH DAILY  . [DISCONTINUED] diazepam (VALIUM) 5 MG tablet TAKE 1 TABLET BY MOUTH EVERY NIGHT AT BEDTIME AND MAY TAKE AN ADDITIONAL TABLET AS NEEDED FOR ANXIETY  . [DISCONTINUED] montelukast (SINGULAIR) 10 MG tablet Take 1 tablet (10 mg total) by mouth at bedtime.  . [DISCONTINUED] SYNTHROID 88 MCG tablet TAKE 1 TABLET BY MOUTH DAILY   No facility-administered encounter medications on file as of 01/19/2019.    Functional Status:  In your present state of health, do you have any difficulty performing the following activities: 08/20/2018  Hearing? N  Vision? N  Difficulty concentrating or making decisions? N  Walking or climbing stairs? N  Dressing or bathing? N  Doing errands, shopping? N  Some recent data might be hidden    Fall/Depression Screening: Fall Risk  01/19/2019 10/19/2018 10/12/2018  Falls in the past year? 1 0 1  Number falls in past yr: 0 - 1  Injury with Fall? 1 - 1  Comment - - -  Risk for fall due to : History of fall(s) - -  Risk for fall due to: Comment - - -  Follow up Falls evaluation completed;Falls prevention discussed - -  Comment - - -   PHQ 2/9 Scores 01/19/2019 11/03/2018 11/03/2018 08/20/2018 08/02/2018 08/02/2018 09/04/2017  PHQ - 2 Score 0 0 0 0 0 0 0  PHQ- 9 Score - - - - - - 0   THN CM Care Plan Problem One     Most Recent Value  Care Plan Problem One  Knowledge deficit in self management of COPD  Role Documenting the Problem One  [place]  for Problem One  Active  THN Long Term Goal   Patient will not have any readmissions for Copd exacerbation within the next 90 days  THN Long Term Goal Start Date  01/19/19  Interventions for Problem One Long Term Goal  RN reiterated COPD exacerbation zones and action plan. RN sent educational material on COPD exacerbation. RN will follow up with further discussion.  THN CM Short Term Goal #1   Patient will verbalize having a better understanding of the Eating Plan for COPD patient within the next 30 days  THN CM Short Term Goal #1 Start Date  01/19/19  Interventions for Short Term Goal #1  RN discussed eating small frequent meals. RN sent educational material on Eating plan for COPD. RN will follow up with further discussion  THN CM Short Term Goal #2   Patient will verbalize having a better understanding of COPD and physical activity  THN CM Short Term Goal #2 Start Date  01/19/19  Interventions for Short Term Goal #2  RN discussed physical activity and a COPD patient. RN sent educational material about COPD and physical activity. RN will follow up with further discussion     Assessment:  Patient is aware of purse lip breathing Patient wears oxygen Patient is in physical therapy to help with balance and strength Patient will continue to benefit from [place] telephonic outreach for education and support for COPD self management.   Plan:  RN discussed purse liped breathing RN sent 2021 Calendar book RN discussed eating and COPD RN sent educational material on small frequent meals and COPD RN discussed COPD and activity RN sent educational on frequent rest periods with COPD RN will follow up within the month of March  Priscilla Cantrell Management [phone]

## 2019-02-03 ENCOUNTER — Telehealth: Payer: Self-pay

## 2019-02-03 NOTE — Telephone Encounter (Signed)
Please advise 

## 2019-02-03 NOTE — Telephone Encounter (Signed)
Priscilla Cantrell has had vertigo for 3 days. She would like to know if Dr. Anitra Lauth thinks physical therapy would help. Priscilla Cantrell is already established at [facility].

## 2019-02-07 DIAGNOSIS — R2689 Other abnormalities of gait and mobility: Secondary | ICD-10-CM | POA: Diagnosis not present

## 2019-02-08 NOTE — Telephone Encounter (Signed)
Spoke with patient and she is feeling okay. Vertigo has subsided. She had PT at [facility] yesterday and tests were done, they did not think it was vertigo. Patient is scheduled to come in on 01/07 for next f/u.

## 2019-02-08 NOTE — Telephone Encounter (Signed)
Noted. Thx.

## 2019-02-08 NOTE — Telephone Encounter (Signed)
If still having vertigo on daily basis since she called last week then I do recommend PT. Let me know.-thx

## 2019-02-10 ENCOUNTER — Encounter: Payer: Self-pay | Admitting: Family Medicine

## 2019-02-10 ENCOUNTER — Ambulatory Visit (INDEPENDENT_AMBULATORY_CARE_PROVIDER_SITE_OTHER): Payer: Medicare Other | Admitting: Family Medicine

## 2019-02-10 ENCOUNTER — Other Ambulatory Visit: Payer: Self-pay

## 2019-02-10 ENCOUNTER — Encounter: Payer: Self-pay | Admitting: Neurology

## 2019-02-10 VITALS — BP 148/79 | HR 66 | Temp 98.1°F | Resp 16 | Ht 60.0 in | Wt 130.8 lb

## 2019-02-10 DIAGNOSIS — I10 Essential (primary) hypertension: Secondary | ICD-10-CM

## 2019-02-10 DIAGNOSIS — J449 Chronic obstructive pulmonary disease, unspecified: Secondary | ICD-10-CM | POA: Diagnosis not present

## 2019-02-10 DIAGNOSIS — F419 Anxiety disorder, unspecified: Secondary | ICD-10-CM

## 2019-02-10 DIAGNOSIS — E039 Hypothyroidism, unspecified: Secondary | ICD-10-CM | POA: Diagnosis not present

## 2019-02-10 DIAGNOSIS — I872 Venous insufficiency (chronic) (peripheral): Secondary | ICD-10-CM | POA: Diagnosis not present

## 2019-02-10 NOTE — Progress Notes (Signed)
OFFICE VISIT  02/10/2019   CC:  Chief Complaint  Patient presents with  . Follow-up    RCI, pt is not fasting   HPI:    Patient is a 84 y.o. Caucasian female who presents for 3 mo f/u HTN, hypothyroidism, and LE venous insufficiency edema, and recurrent BPPV. She has copd with chronic hypoxic RF followed by [name]. Breathing has been good. Recently had several days of BPPV again, spontaneously resolved. Not typically using oxygen in daytime but always wears 2L oxygen hs.  HTN: bp up here today some.  Goes through spurts of monitoring at home, not checking last few weeks.  Some 140s recalled but also 130s, most of the time diast in 2s.  Interim hx: She has seen Dr. Posey Pronto in neurology for bilat feet numbness (chronic)--lab eval all normal, dx'd with idiopathic PN, no meds rx'd.  She got PT for balance/gait stability and she did well and continues to do home exercises. She also had hyperreflexia and an MRI of neck was done (see below).  Hypothyroidism: has always taken the med after eating breakfast and with all of her meds. Through this time, though, her TSH's have been consistently normal.  Anxiety: taking valium 5mg  bid--helping well with anxiety and mood and sleep.  LE venous insuff: says legs swelling has been "good" lately.  Not taking lasix lately at all.  Imaging: MRI w/out contrast of C spine 12/03/18 by Dr. Posey Pronto: IMPRESSION: 1. Moderate bilateral foraminal stenosis at C5-6 which could affect either or both C6 nerves. 2. Slight compression of the ventral aspect of the spinal cord at C4-5 and C5-6 without myelopathy.   Past Medical History:  Diagnosis Date  . Allergic rhinitis    with upper airway cough: as of 07/2017 pulm f/u she was instructed to use astelin, flonase, and saline more consistently  . BPPV (benign paroxysmal positional vertigo) 03/2018  . Chest pain, non-cardiac    Cardiac CT showed no signif obstructive dz, mildly elevated calcium level (Dr.  [name]).  . Chronic cystitis with hematuria    [name]  . Chronic hypoxemic respiratory failure ([place]) 02/02/2015   2L oxygen 24/7  . COPD (chronic obstructive pulmonary disease) (HCC)    GOLD 4.  spirometry 01/10/09 FEV 0.97(52%), FEV1% 47.  With chronic bronchitis as of 07/2017.  . DDD (degenerative disc disease), cervical    1998 MRI C5-6 impingemt (left)  . DDD (degenerative disc disease), lumbar   . Depression with anxiety 04/15/2011  . Diarrheal disease Summer 2017   ? 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).  As of 10/05/15 pt reports GI dx'd her with C diff and rx'd more flagyl and she is also on cholestyramine.  As of 02/2016, pt's sx's resolved completely.  . Diverticulosis of colon    Procto '97 and colonoscopy 2002  . Dysplastic nevus of upper extremity 04/2014   R tricep ([name])  . Edema of both lower extremities due to peripheral venous insufficiency    Varicosities bilat, swelling L>R.  R baker's cyst.  Got vein clinic eval 02/2018->sclerotherapy recommended but since no hemorrhage or ulceration, insurance will not cover the procedure.  . Family history of adverse reaction to anesthesia    mother died when patient age 66 receiving anesthesia for thyroid surgery  . Fibrocystic breast disease    w/fibroadenoma.  Bx's showed NO atypia (Dr. Margot Chimes).  Mammo neg 2008.  Marland Kitchen GERD (  gastroesophageal reflux disease)   . History of double vision    Ophthalmologist, Dr. Herbert Deaner, is further evaluating this with MRI  orbits and limited brain (myesthenia gravis testing neg 07/2010)  . History of home oxygen therapy    uses 2 liters at hs  . Hypertension    EKG 03/2010 normal  . Hypothyroidism    Hashimoto's, dx'd 1989  . Lesion of right native kidney 2017   found on CT 02/2015.  F/u MRI 03/31/15 showed it to be smaller and less likely of any significance but repeat CT renal  protocol in 32mo was recommended by radiology.  This was done 10/26/15 and showed no interval change in the lesion, but b/c the lesion enhances with contrast, radiol rec'd urol consult (b/c pap renal RCC could not be excluded).  Saw urol 03/06/16--stable on u/s 09/2016 and 09/2017.  . Numbness in feet 04/2018   S1 distribution bilat, c/w spinal stenosis sensory neuropathy (no pain). 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  Labs and L spine MRI planned as next step.  . Osteoarthritis, hip, bilateral    she is s/p bilat THA as of 02/2018.  Marland Kitchen Pneumonia   . Postmenopausal atrophic vaginitis    improved with estrace 2 X/week.  . Prolapse of vaginal cuff after hysterectomy    10/2018: Dr. Wayne Both  . Pulmonary nodule    CT chest 12/10, June 2011.  No nodule on CT 06/2010.  Marland Kitchen Recurrent Clostridium difficile diarrhea    Episode 09/19/2016: resolved with prolonged course of flagyl.  11/2016 recurrence treated with 10d of Dificid (Fidaxomicin) by GI (Dr. Earlean Shawl).  . Recurrent UTI    likely related to pt's atrophic vaginitis.  Urol started vaginal estrace 03/2016.  . Rib fracture 09/2018   R 7th, laterally-->sustained when her dog pulled her over and she fell.  . Shingles 02/2014   R abd/flank  . Spinal stenosis of lumbar region with neurogenic claudication    2008 MRI (L3-4, L4-5, L5-S1).  +S1 distribution sensory loss bilat    Past Surgical History:  Procedure Laterality Date  . ABDOMINAL HYSTERECTOMY  1978   no BSO per pt--nonmalignant reasons  . APPENDECTOMY    . BREAST CYST EXCISION     Last screening mammogram 10/2010 was normal.  . CHOLECYSTECTOMY  2001  . COLONOSCOPY  04/2005; 12/06/2015   2007 NORMAL.  2017 adenomatous polyp x 1.  Mod sigmoid diverticulosis (Dr. Earlean Shawl).    Marland Kitchen DEXA  05/18/2017   NORMAL (T-score 0.1)  . HIP CLOSED REDUCTION Right 12/25/2015   Procedure: CLOSED REDUCTION HIP;  Surgeon: Nicholes Stairs, MD;  Location:  [facility];  Service: Orthopedics;  Laterality: Right;  . NCV/EMS  10/2018   chronic and symmetric sensorimotor axonal polyneuropathy affecting the lower extremities  . TONSILLECTOMY    . TOTAL HIP ARTHROPLASTY Right 11/01/2014   Procedure: RIGHT TOTAL HIP ARTHROPLASTY;  Surgeon: Latanya Maudlin, MD;  Location: [facility];  Service: Orthopedics;  Laterality: Right;  . TOTAL HIP ARTHROPLASTY Left 12/08/2017   Procedure: LEFT TOTAL HIP ARTHROPLASTY ANTERIOR APPROACH;  Surgeon: Paralee Cancel, MD;  Location: [facility];  Service: Orthopedics;  Laterality: Left;  70 mins  . TRANSTHORACIC ECHOCARDIOGRAM  08/2014   EF 60-65%, grade I diast dysfxn  . TUBAL LIGATION  1974    Outpatient Medications Prior to Visit  Medication Sig Dispense Refill  . amLODipine (NORVASC) 5 MG tablet Take 1 tablet (5 mg total) by mouth daily. 90 tablet 0  .  aspirin EC 81 MG tablet Take 81 mg by mouth daily.    Marland Kitchen azelastine (ASTELIN) 0.1 % nasal spray Place 2 sprays into both nostrils 2 (two) times daily. Use in each nostril as directed (Patient taking differently: Place 2 sprays into both nostrils 2 (two) times daily as needed for rhinitis or allergies. Use in each nostril as directed) 30 mL 3  . diazepam (VALIUM) 5 MG tablet TAKE 1 TABLET BY MOUTH EVERY NIGHT AT BEDTIME AND MAY TAKE AN ADDITIONAL TABLET AS NEEDED FOR ANXIETY 60 tablet 5  . fluorometholone (FML) 0.1 % ophthalmic suspension Place 1 drop into both eyes as directed. Tapering off    . fluticasone (FLONASE) 50 MCG/ACT nasal spray Place 1 spray into both nostrils daily. (Patient taking differently: Place 1 spray into both nostrils as needed. ) 16 g 2  . Fluticasone-Umeclidin-Vilant (TRELEGY ELLIPTA) 100-62.5-25 MCG/INH AEPB Inhale 1 puff into the lungs daily. 1 each 11  . LACTASE ENZYME FAST ACTING PO Take 9,000 Units by mouth as needed.    . montelukast (SINGULAIR) 10 MG tablet Take 1 tablet (10 mg total) by mouth at bedtime. 90 tablet 0  . Probiotic Product (PROBIOTIC DAILY  PO) Take 1 capsule by mouth daily.     . roflumilast (DALIRESP) 500 MCG TABS tablet Take 1 tablet (500 mcg total) by mouth daily. 30 tablet 11  . SYNTHROID 88 MCG tablet Take 1 tablet (88 mcg total) by mouth daily. 30 tablet 5  . triamcinolone cream (KENALOG) 0.1 % Apply 1 application topically 2 (two) times daily. 30 g 0  . venlafaxine (EFFEXOR) 50 MG tablet TAKE 1 TABLET BY MOUTH  TWICE DAILY 180 tablet 3  . albuterol (PROAIR HFA) 108 (90 Base) MCG/ACT inhaler Inhale 2 puffs into the lungs every 4 (four) hours as needed for wheezing or shortness of breath. (Patient not taking: Reported on 02/10/2019) 8.5 g 1  . albuterol (PROVENTIL) (2.5 MG/3ML) 0.083% nebulizer solution INHALE 1 VIAL VIA NEBULIZER EVERY 6 HOURS AS NEEDED FOR WHEEZING OR SHORTNESS OF BREATH (Patient not taking: Reported on 02/10/2019) 75 mL 1  . Artificial Tear Solution (SOOTHE XP) SOLN Place 1 drop into both eyes 2 (two) times daily as needed (irritation).    Marland Kitchen diclofenac sodium (VOLTAREN) 1 % GEL Apply 4 g topically 4 (four) times daily. (Patient not taking: Reported on 11/03/2018) 100 g 1  . diphenhydrAMINE (BENADRYL) 25 MG tablet Take 25 mg by mouth 3 (three) times daily as needed for allergies.    . furosemide (LASIX) 20 MG tablet 1 tab po qd IF NEEDED for increased fluid collection in legs (Patient not taking: Reported on 02/10/2019) 30 tablet 3   No facility-administered medications prior to visit.    Allergies  Allergen Reactions  . Losartan Other (See Comments)    hyperkalemia  . Penicillins Hives, Swelling and Rash    Has patient had a PCN reaction causing immediate rash, facial/tongue/throat swelling, SOB or lightheadedness with hypotension: YES Has patient had a PCN reaction causing severe rash involving mucus membranes or skin necrosis: NO Has patient had a PCN reaction that required hospitalization NO Has patient had a PCN reaction occurring within the last 10 years: NO If all of the above answers are "NO", then may  proceed with Cephalosporin use.   . Cefixime [Kdc:Cefixime] Other (See Comments)    unspecified  . Indocin [Indomethacin] Other (See Comments)    Painful tongue and throat  . Singulair [Montelukast Sodium]     Chest  tightness   . Sulfa Antibiotics Other (See Comments)    unspecified  . Ace Inhibitors Other (See Comments)    cough  . Statins Other (See Comments)    Myalgias     ROS As per HPI  PE: Initial bp automated cuff was 148/79.  Repeat with manual cuff at end of visit was 128/76 Blood pressure (!) 148/79, pulse 66, temperature 98.1 F (36.7 C), temperature source Temporal, resp. rate 16, height 5' (1.524 m), weight 130 lb 12.8 oz (59.3 kg), SpO2 97 %.RA  Gen: Alert, well appearing.  Patient is oriented to person, place, time, and situation. AFFECT: pleasant, lucid thought and speech. CV: RRR, no m/r/g.   LUNGS: CTA bilat, nonlabored resps, good aeration in all lung fields. EXT: no clubbing or cyanosis.  1+ pitting edema in both ankles.   LABS:    Chemistry      Component Value Date/Time   NA 142 05/26/2018 0952   K 3.9 05/26/2018 0952   CL 106 05/26/2018 0952   CO2 26 05/26/2018 0952   BUN 16 05/26/2018 0952   CREATININE 0.82 05/26/2018 0952   CREATININE 0.84 02/27/2016 1533      Component Value Date/Time   CALCIUM 9.4 05/26/2018 0952   ALKPHOS 91 05/26/2018 0952   AST 12 05/26/2018 0952   ALT 11 05/26/2018 0952   BILITOT 0.6 05/26/2018 0952     Lab Results  Component Value Date   CHOL 211 (H) 05/26/2018   HDL 68.20 05/26/2018   LDLCALC 127 (H) 05/26/2018   LDLDIRECT 131.3 03/07/2010   TRIG 79.0 05/26/2018   CHOLHDL 3 05/26/2018   Lab Results  Component Value Date   TSH 1.01 05/26/2018   Lab Results  Component Value Date   WBC 8.9 05/26/2018   HGB 13.7 05/26/2018   HCT 42.0 05/26/2018   MCV 86.9 05/26/2018   PLT 269.0 05/26/2018   Lab Results  Component Value Date   VITAMINB12 314 10/12/2018   Lab Results  Component Value Date    HGBA1C 5.7 10/29/2011    IMPRESSION AND PLAN:  1) HTN: needs to start monitoring again some at home. Initial bp up today but manual recheck normal.  Sounds like some home systolics are mildly up sometimes. No changes in med today. Plan lytes/cr at next f/u in 3 mo.  2) Hypothyroidism: doing well taking the med "incorrectly" as she has always done. Plan recheck TSH 3 mo (annual check).  3) Anxiety: doing fine on 5mg  valium bid.  4) COPD: doing very well, esp since getting on trelelgy ellipta. Continue prn oxygen daytime, 2L continuous hs.  5) Peripheral neuropathy: idiopathic. She has f/u with Dr. Posey Pronto tomorrow.  6) LE venous insufficiency edema; doing very well with low Na diet, compression stockings, and elevation.  Has lasix to use prn but has not had to use this lately.  7) REcurrent BPPV: recent 3-4 day spell of this spontaneously resolved about 4 days ago.  An After Visit Summary was printed and given to the patient.  FOLLOW UP: Return in about 3 months (around 05/11/2019) for telemed or in office, her choice.  Signed:  Crissie Sickles, MD           02/10/2019

## 2019-02-11 ENCOUNTER — Telehealth (INDEPENDENT_AMBULATORY_CARE_PROVIDER_SITE_OTHER): Payer: Medicare Other | Admitting: Neurology

## 2019-02-11 VITALS — Ht 60.0 in | Wt 130.0 lb

## 2019-02-11 DIAGNOSIS — G629 Polyneuropathy, unspecified: Secondary | ICD-10-CM

## 2019-02-11 DIAGNOSIS — G959 Disease of spinal cord, unspecified: Secondary | ICD-10-CM

## 2019-02-11 DIAGNOSIS — G2581 Restless legs syndrome: Secondary | ICD-10-CM

## 2019-02-11 MED ORDER — GABAPENTIN 300 MG PO CAPS: 300.0000 mg | ORAL_CAPSULE | Freq: Every day | ORAL | 11 refills | Status: DC

## 2019-02-11 NOTE — Progress Notes (Signed)
Virtual Visit via Video Note The purpose of this virtual visit is to provide medical care while limiting exposure to the novel coronavirus.    Consent was obtained for video visit:  Yes.   Answered questions that patient had about telehealth interaction:  Yes.   I discussed the limitations, risks, security and privacy concerns of performing an evaluation and management service by telemedicine. I also discussed with the patient that there may be a patient responsible charge related to this service. The patient expressed understanding and agreed to proceed.  Pt location: Home Physician Location: office Name of referring provider:  [name], MD I connected with Priscilla Cantrell at patients initiation/request on 02/11/2019 at 10:50 AM EST by video enabled telemedicine application and verified that I am speaking with the correct person using two identifiers. Pt MRN:  [id number] Pt DOB:  1935-12-04 Video Participants:  Priscilla Cantrell   History of Present Illness: This is a 84 y.o. female returning for follow-up of peripheral neuropathy and new complaints of restless leg syndrome.  Her neuropathy has been stable and remains restricted to the feet with predominantly numbness.  Occasionally, she can get tingling and discomfort.  She had MRI cervical spine in October which showed cervical canal stenosis at C4-5 and C5-6 with by foraminal stenosis at the lateral level.  She has completed physical therapy which has helped her overall strength and stability.  She suffered one fall after tripping on her oxygen cord.  She complains of restless sensation in the legs, which often wakes her up at night due to the urge to move her legs.    Observations/Objective:   Vitals:   02/10/19 1508  Weight: 130 lb (59 kg)  Height: 5' (1.524 m)   Patient is awake, alert, and appears comfortable.  Oriented x 4.   Extraocular muscles are intact. No ptosis.  Face is symmetric.  Antigravity in all extremities.     DATA: MRI cervical spine wo contrast 12/03/2018: 1. Moderate bilateral foraminal stenosis at C5-6 which could affect either or both C6 nerves. 2. Slight compression of the ventral aspect of the spinal cord at C4-5 and C5-6 without myelopathy.  Labs 10/12/2018:  Vitamin B12 314, folate 15.8, vitamin 14, copper 141, SPEP with IFE no M protein  Assessment and Plan:  1.  Idiopathic peripheral neuropathy, stable  - Continue home balance exercises.  PT completed with marked benefit.  - Patient educated on daily foot inspection, fall prevention, and safety precautions around the home.   2.  Restless leg syndrome - new  - Start gabapentin 300mg  at bedtime  - She reports taking valium 5mg  since late 1970s for RLS which has been continued by her PCP.  If gabapentin controls symptoms adequately, she may be able to be slowly tapered off valium  3.  Cervical canal stenosis at C4-5 and C5-6 and biforaminal stenosis at C5-6, MRI was independently viewed and agree with findings.  She is clinically asymptomatic. If she developed new neck pain, arm weakness, or arm tingling, will need to seek opinion of spine surgery.    Follow Up Instructions:   I discussed the assessment and treatment plan with the patient. The patient was provided an opportunity to ask questions and all were answered. The patient agreed with the plan and demonstrated an understanding of the instructions.   The patient was advised to call back or seek an in-person evaluation if the symptoms worsen or if the condition fails to improve as anticipated.  Follow-up in 9 months   [place], DO

## 2019-02-13 ENCOUNTER — Encounter: Payer: Self-pay | Admitting: Family Medicine

## 2019-02-21 ENCOUNTER — Telehealth: Payer: Self-pay | Admitting: Neurology

## 2019-02-21 NOTE — Telephone Encounter (Signed)
No known injury, any suggestions

## 2019-02-21 NOTE — Telephone Encounter (Signed)
Did her left hand symptoms improve?  Does she have weakness?  She would need to be evaluated for these symptoms, since they are new.  We can offer a VV this Thursday or check with your PCP, if they have any sooner appointment.

## 2019-02-21 NOTE — Telephone Encounter (Signed)
Patient called needing to let Dr. Posey Pronto know that when woke up this morning that her Left hand "Felt like it was on Fire". Please Call. Thank you

## 2019-02-21 NOTE — Telephone Encounter (Signed)
Please schedule patient for a VV on Thursday Per Dr. Posey Pronto

## 2019-02-22 NOTE — Telephone Encounter (Signed)
Patient scheduled for a virtual visit on 02/24/19 at 7:50 AM.

## 2019-02-23 ENCOUNTER — Encounter: Payer: Self-pay | Admitting: Family Medicine

## 2019-02-23 ENCOUNTER — Encounter: Payer: Self-pay | Admitting: Neurology

## 2019-02-23 ENCOUNTER — Other Ambulatory Visit: Payer: Self-pay

## 2019-02-23 ENCOUNTER — Ambulatory Visit (INDEPENDENT_AMBULATORY_CARE_PROVIDER_SITE_OTHER): Payer: Medicare Other | Admitting: Family Medicine

## 2019-02-23 VITALS — BP 133/79 | HR 72 | Temp 98.2°F | Resp 16 | Ht 60.0 in | Wt 132.2 lb

## 2019-02-23 DIAGNOSIS — R202 Paresthesia of skin: Secondary | ICD-10-CM | POA: Diagnosis not present

## 2019-02-23 NOTE — Progress Notes (Signed)
OFFICE VISIT  02/23/2019   CC:  Chief Complaint  Patient presents with  . Hand Pain    left, feels like on fire, x3 days and happened once, no radiating or shooting pain   HPI:    Patient is a 84 y.o. Caucasian female who presents for left hand complaint. She noted her left hand felt hot to touch 5 days ago and hot feeling in skin but no pain. No color change at all.  She could move it all fine, used hand normally.  Noted upon awakening that morning. It lasted approx 2-3 hours.   Denies this happening in the past. Denies any raynaud's-like sensory or skin changes in the past.  Has appt with her neurologist tomorrow   Past Medical History:  Diagnosis Date  . Allergic rhinitis    with upper airway cough: as of 07/2017 pulm f/u she was instructed to use astelin, flonase, and saline more consistently  . BPPV (benign paroxysmal positional vertigo) 03/2018  . Chest pain, non-cardiac    Cardiac CT showed no signif obstructive dz, mildly elevated calcium level (Dr. [name]).  . Chronic cystitis with hematuria    [name]  . Chronic hypoxemic respiratory failure ([place]) 02/02/2015   2L oxygen 24/7  . COPD (chronic obstructive pulmonary disease) ([facility])    GOLD 4.  spirometry 01/10/09 FEV 0.97(52%), FEV1% 47.  With chronic bronchitis as of 07/2017.  . DDD (degenerative disc disease), cervical    1998 MRI C5-6 impingemt (left)  . DDD (degenerative disc disease), lumbar   . Depression with anxiety 04/15/2011  . Diarrheal disease Summer 2017   ? 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).  As of 10/05/15 pt reports GI dx'd her with C diff and rx'd more flagyl and she is also on cholestyramine.  As of 02/2016, pt's sx's resolved completely.  . Diverticulosis of colon    Procto '97 and colonoscopy 2002  . Dysplastic nevus of upper extremity 04/2014   R tricep ([name])  . Edema of  both lower extremities due to peripheral venous insufficiency    Varicosities bilat, swelling L>R.  R baker's cyst.  Got vein clinic eval 02/2018->sclerotherapy recommended but since no hemorrhage or ulceration, insurance will not cover the procedure.  . Family history of adverse reaction to anesthesia    mother died when patient age 67 receiving anesthesia for thyroid surgery  . Fibrocystic breast disease    w/fibroadenoma.  Bx's showed NO atypia (Dr. Margot Chimes).  Mammo neg 2008.  Marland Kitchen GERD (gastroesophageal reflux disease)   . History of double vision    Ophthalmologist, Dr. Herbert Deaner, is further evaluating this with MRI  orbits and limited brain (myesthenia gravis testing neg 07/2010)  . History of home oxygen therapy    uses 2 liters at hs  . Hypertension    EKG 03/2010 normal  . Hypothyroidism    Hashimoto's, dx'd 1989  . Idiopathic peripheral neuropathy 04/2018   Sensory (numb feet) S1 distribution bilat, c/w spinal stenosis sensory neuropathy (no pain). 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  Labs [place], MRI C spine->some spondylosis/stenosis  . Lesion of right native kidney 2017   found on CT 02/2015.  F/u MRI 03/31/15 showed it to be smaller and less likely of any significance but repeat CT renal protocol in 8mo was recommended by radiology.  This was done 10/26/15 and showed no interval change in  the lesion, but b/c the lesion enhances with contrast, radiol rec'd urol consult (b/c pap renal RCC could not be excluded).  Saw urol 03/06/16--stable on u/s 09/2016 and 09/2017.  . Osteoarthritis, hip, bilateral    she is s/p bilat THA as of 02/2018.  Marland Kitchen Pneumonia   . Postmenopausal atrophic vaginitis    improved with estrace 2 X/week.  . Prolapse of vaginal cuff after hysterectomy    10/2018: Dr. Wayne Both  . Pulmonary nodule    CT chest 12/10, June 2011.  No nodule on CT 06/2010.  Marland Kitchen Recurrent Clostridium difficile diarrhea    Episode  09/19/2016: resolved with prolonged course of flagyl.  11/2016 recurrence treated with 10d of Dificid (Fidaxomicin) by GI (Dr. Earlean Shawl).  . Recurrent UTI    likely related to pt's atrophic vaginitis.  Urol started vaginal estrace 03/2016.  . Rib fracture 09/2018   R 7th, laterally-->sustained when her dog pulled her over and she fell.  . Shingles 02/2014   R abd/flank  . Spinal stenosis of lumbar region with neurogenic claudication    2008 MRI (L3-4, L4-5, L5-S1).  +S1 distribution sensory loss bilat    Past Surgical History:  Procedure Laterality Date  . ABDOMINAL HYSTERECTOMY  1978   no BSO per pt--nonmalignant reasons  . APPENDECTOMY    . BREAST CYST EXCISION     Last screening mammogram 10/2010 was normal.  . CHOLECYSTECTOMY  2001  . COLONOSCOPY  04/2005; 12/06/2015   2007 NORMAL.  2017 adenomatous polyp x 1.  Mod sigmoid diverticulosis (Dr. Earlean Shawl).    Marland Kitchen DEXA  05/18/2017   NORMAL (T-score 0.1)  . HIP CLOSED REDUCTION Right 12/25/2015   Procedure: CLOSED REDUCTION HIP;  Surgeon: Nicholes Stairs, MD;  Location: [facility];  Service: Orthopedics;  Laterality: Right;  . NCV/EMS  10/2018   chronic and symmetric sensorimotor axonal polyneuropathy affecting the lower extremities  . TONSILLECTOMY    . TOTAL HIP ARTHROPLASTY Right 11/01/2014   Procedure: RIGHT TOTAL HIP ARTHROPLASTY;  Surgeon: Latanya Maudlin, MD;  Location: [facility];  Service: Orthopedics;  Laterality: Right;  . TOTAL HIP ARTHROPLASTY Left 12/08/2017   Procedure: LEFT TOTAL HIP ARTHROPLASTY ANTERIOR APPROACH;  Surgeon: Paralee Cancel, MD;  Location: [facility];  Service: Orthopedics;  Laterality: Left;  70 mins  . TRANSTHORACIC ECHOCARDIOGRAM  08/2014   EF 60-65%, grade I diast dysfxn  . TUBAL LIGATION  1974    Outpatient Medications Prior to Visit  Medication Sig Dispense Refill  . albuterol (PROAIR HFA) 108 (90 Base) MCG/ACT inhaler Inhale 2 puffs into the lungs every 4 (four) hours as needed for wheezing or shortness of breath.  8.5 g 1  . albuterol (PROVENTIL) (2.5 MG/3ML) 0.083% nebulizer solution INHALE 1 VIAL VIA NEBULIZER EVERY 6 HOURS AS NEEDED FOR WHEEZING OR SHORTNESS OF BREATH 75 mL 1  . amLODipine (NORVASC) 5 MG tablet Take 1 tablet (5 mg total) by mouth daily. 90 tablet 0  . Artificial Tear Solution (SOOTHE XP) SOLN Place 1 drop into both eyes 2 (two) times daily as needed (irritation).    Marland Kitchen aspirin EC 81 MG tablet Take 81 mg by mouth daily.    Marland Kitchen azelastine (ASTELIN) 0.1 % nasal spray Place 2 sprays into both nostrils 2 (two) times daily. Use in each nostril as directed (Patient taking differently: Place 2 sprays into both nostrils 2 (two) times daily as needed for rhinitis or allergies. Use in each nostril as directed) 30 mL 3  . diazepam (VALIUM) 5 MG  tablet TAKE 1 TABLET BY MOUTH EVERY NIGHT AT BEDTIME AND MAY TAKE AN ADDITIONAL TABLET AS NEEDED FOR ANXIETY 60 tablet 5  . diclofenac sodium (VOLTAREN) 1 % GEL Apply 4 g topically 4 (four) times daily. 100 g 1  . diphenhydrAMINE (BENADRYL) 25 MG tablet Take 25 mg by mouth 3 (three) times daily as needed for allergies.    . fluorometholone (FML) 0.1 % ophthalmic suspension Place 1 drop into both eyes as directed. Tapering off    . fluticasone (FLONASE) 50 MCG/ACT nasal spray Place 1 spray into both nostrils daily. (Patient taking differently: Place 1 spray into both nostrils as needed. ) 16 g 2  . Fluticasone-Umeclidin-Vilant (TRELEGY ELLIPTA) 100-62.5-25 MCG/INH AEPB Inhale 1 puff into the lungs daily. 1 each 11  . furosemide (LASIX) 20 MG tablet 1 tab po qd IF NEEDED for increased fluid collection in legs 30 tablet 3  . LACTASE ENZYME FAST ACTING PO Take 9,000 Units by mouth as needed.    . montelukast (SINGULAIR) 10 MG tablet Take 1 tablet (10 mg total) by mouth at bedtime. 90 tablet 0  . Probiotic Product (PROBIOTIC DAILY PO) Take 1 capsule by mouth daily.     . roflumilast (DALIRESP) 500 MCG TABS tablet Take 1 tablet (500 mcg total) by mouth daily. 30 tablet  11  . SYNTHROID 88 MCG tablet Take 1 tablet (88 mcg total) by mouth daily. 30 tablet 5  . triamcinolone cream (KENALOG) 0.1 % Apply 1 application topically 2 (two) times daily. 30 g 0  . venlafaxine (EFFEXOR) 50 MG tablet TAKE 1 TABLET BY MOUTH  TWICE DAILY 180 tablet 3  . gabapentin (NEURONTIN) 300 MG capsule Take 1 capsule (300 mg total) by mouth at bedtime. (Patient not taking: Reported on 02/23/2019) 30 capsule 11   No facility-administered medications prior to visit.    Allergies  Allergen Reactions  . Losartan Other (See Comments)    hyperkalemia  . Penicillins Hives, Swelling and Rash    Has patient had a PCN reaction causing immediate rash, facial/tongue/throat swelling, SOB or lightheadedness with hypotension: YES Has patient had a PCN reaction causing severe rash involving mucus membranes or skin necrosis: NO Has patient had a PCN reaction that required hospitalization NO Has patient had a PCN reaction occurring within the last 10 years: NO If all of the above answers are "NO", then may proceed with Cephalosporin use.   . Cefixime [Kdc:Cefixime] Other (See Comments)    unspecified  . Indocin [Indomethacin] Other (See Comments)    Painful tongue and throat  . Singulair [Montelukast Sodium]     Chest tightness   . Sulfa Antibiotics Other (See Comments)    unspecified  . Ace Inhibitors Other (See Comments)    cough  . Statins Other (See Comments)    Myalgias     ROS As per HPI  PE: Blood pressure 133/79, pulse 72, temperature 98.2 F (36.8 C), temperature source Temporal, resp. rate 16, height 5' (1.524 m), weight 132 lb 3.2 oz (60 kg), SpO2 96 %. Gen: Alert, well appearing.  Patient is oriented to person, place, time, and situation. AFFECT: pleasant, lucid thought and speech. Hands: normal temp, no pallor, rubor, or cyanosis.  Radial pulses 2+ bilat. Sensation to light touch and monofilament symmetric. Wrist/hand/fingers strength 5/5 bilat.  LABS:    Chemistry       Component Value Date/Time   NA 142 05/26/2018 0952   K 3.9 05/26/2018 0952   CL 106 05/26/2018 [id number]  CO2 26 05/26/2018 0952   BUN 16 05/26/2018 0952   CREATININE 0.82 05/26/2018 0952   CREATININE 0.84 02/27/2016 1533      Component Value Date/Time   CALCIUM 9.4 05/26/2018 0952   ALKPHOS 91 05/26/2018 0952   AST 12 05/26/2018 0952   ALT 11 05/26/2018 0952   BILITOT 0.6 05/26/2018 0952     Lab Results  Component Value Date   WBC 8.9 05/26/2018   HGB 13.7 05/26/2018   HCT 42.0 05/26/2018   MCV 86.9 05/26/2018   PLT 269.0 05/26/2018   Lab Results  Component Value Date   TSH 1.01 05/26/2018    IMPRESSION AND PLAN:  Paresthesia L hand, unknown etiology. One isolated episode thus far, resolved after a couple hours. Reassured her I have no red flags, no dx for her at this time. Watchful waiting approach. Signs/symptoms to call or return for were reviewed and pt expressed understanding.  An After Visit Summary was printed and given to the patient.  FOLLOW UP: Return if symptoms worsen or fail to improve.  Signed:  Crissie Sickles, MD           02/23/2019

## 2019-02-24 ENCOUNTER — Telehealth (INDEPENDENT_AMBULATORY_CARE_PROVIDER_SITE_OTHER): Payer: Medicare Other | Admitting: Neurology

## 2019-02-24 DIAGNOSIS — R202 Paresthesia of skin: Secondary | ICD-10-CM

## 2019-02-24 DIAGNOSIS — G2581 Restless legs syndrome: Secondary | ICD-10-CM

## 2019-02-24 MED ORDER — GABAPENTIN 100 MG PO CAPS: ORAL_CAPSULE | ORAL | 5 refills | Status: DC

## 2019-02-24 NOTE — Progress Notes (Signed)
Virtual Visit via Video Note The purpose of this virtual visit is to provide medical care while limiting exposure to the novel coronavirus.    Consent was obtained for video visit:  Yes.   Answered questions that patient had about telehealth interaction:  Yes.   I discussed the limitations, risks, security and privacy concerns of performing an evaluation and management service by telemedicine. I also discussed with the patient that there may be a patient responsible charge related to this service. The patient expressed understanding and agreed to proceed.  Pt location: Home Physician Location: office Name of referring provider:  [name], MD I connected with Priscilla Cantrell at patients initiation/request on 02/24/2019 at  7:50 AM EST by video enabled telemedicine application and verified that I am speaking with the correct person using two identifiers. Pt MRN:  [id number] Pt DOB:  September 12, 1935 Video Participants:  Priscilla Cantrell   History of Present Illness: This is a 84 y.o. female returning with new complains of left hand abnormal sensation and follow-up for neuropathy and restless leg.  Earlier this week, she woke up with hot sensation of the left hand, which lasted about 20-30 min, as if her hand was in warm water.  She had no numbness, tingling, or weakness.  There was no associated neck pain or radicular pain.  Symptoms have not recurred.   At her last visit, I recommended that she start gabapentin 300 mg at bedtime for restless leg syndrome.  After taking the first dose, she developed severe sedation and not take this again.  She also has been on Valium 5 mg for many years and was asking about potential interaction.  Neuropathy in the feet manifesting with numbness is unchanged.  Observations/Objective:   There were no vitals filed for this visit. Patient is awake, alert, and appears comfortable.  Oriented x 4.   Extraocular muscles are intact. No ptosis.  Face is  symmetric.   Antigravity in all extremities.  Muscle bulk in the hand appears preserved.  Finger tapping bilaterally is intact.     Assessment and Plan:  1.  Left hand dysesthesias described as hot sensation of the entire hand, lasting 20-min.  No recurrence.  No associated weakness.  Unclear etiology.  Given that she had symptoms upon awakening, entrapment neuropathy is considered, although she denies typical paresthesias of numbness or tingling.  If symptoms recur, she will return to the office for electrodiagnostic testing of the left hand. She has known cervical disease with bilateral foraminal stenosis at C5-6 and disc protrusion at C3-4 and C5-6 with mild compression of the cord.  Her left hand dysesthesias does not correlate with these findings.  2.  Restless leg syndrome, intolerant to gabapentin 300 mg at bedtime Reduce gabapentin to 100 mg at bedtime for 2 weeks, if tolerating, okay to titrate to 200 mg at bedtime. While she is taking gabapentin, I advised her to reduce Valium to 2.5 mg as both medications can cause sedation  3.  Idiopathic peripheral neuropathy affecting the feet, manifesting with numbness and sensory ataxia, stable   Follow Up Instructions:   I discussed the assessment and treatment plan with the patient. The patient was provided an opportunity to ask questions and all were answered. The patient agreed with the plan and demonstrated an understanding of the instructions.   The patient was advised to call back or seek an in-person evaluation if the symptoms worsen or if the condition fails to improve as anticipated.  Alda Berthold, DO

## 2019-02-25 ENCOUNTER — Telehealth: Payer: Self-pay | Admitting: Neurology

## 2019-02-25 DIAGNOSIS — H02885 Meibomian gland dysfunction left lower eyelid: Secondary | ICD-10-CM | POA: Diagnosis not present

## 2019-02-25 DIAGNOSIS — H04123 Dry eye syndrome of bilateral lacrimal glands: Secondary | ICD-10-CM | POA: Diagnosis not present

## 2019-02-25 DIAGNOSIS — H532 Diplopia: Secondary | ICD-10-CM | POA: Diagnosis not present

## 2019-02-25 NOTE — Telephone Encounter (Signed)
Office notes and labs faxed to dr [employer] office

## 2019-02-25 NOTE — Telephone Encounter (Signed)
Priscilla Cantrell from [facility] would like a copy of this patient's last office note as well as her lab work. Her fax is [phone]. Please make attention to [place]. Thank you

## 2019-02-28 ENCOUNTER — Encounter: Payer: Self-pay | Admitting: Family Medicine

## 2019-03-04 ENCOUNTER — Other Ambulatory Visit: Payer: Self-pay

## 2019-03-04 ENCOUNTER — Telehealth: Payer: Self-pay | Admitting: *Deleted

## 2019-03-04 MED ORDER — MONTELUKAST SODIUM 10 MG PO TABS: 10.0000 mg | ORAL_TABLET | Freq: Every day | ORAL | 0 refills | Status: DC

## 2019-03-04 NOTE — Telephone Encounter (Signed)
Last Diazepam RX:  02/04/19, #60 Last OV: 02/23/19 Next OV:  05/11/19 UDS:  Not on file CSC:  Not on file

## 2019-03-04 NOTE — Telephone Encounter (Signed)
Nothing further needed 

## 2019-03-04 NOTE — Telephone Encounter (Signed)
The last rx I did 01/2019 have 5 RFs on it-->she does not need new rx at this time.

## 2019-03-10 ENCOUNTER — Other Ambulatory Visit: Payer: Self-pay

## 2019-03-10 MED ORDER — MONTELUKAST SODIUM 10 MG PO TABS: 10.0000 mg | ORAL_TABLET | Freq: Every day | ORAL | 0 refills | Status: DC

## 2019-03-16 ENCOUNTER — Telehealth: Payer: Self-pay | Admitting: Neurology

## 2019-03-16 DIAGNOSIS — G629 Polyneuropathy, unspecified: Secondary | ICD-10-CM

## 2019-03-16 DIAGNOSIS — R202 Paresthesia of skin: Secondary | ICD-10-CM

## 2019-03-16 NOTE — Telephone Encounter (Signed)
Patient left vm wanting to give some info to Dr. Posey Pronto and nurse- I tried to call back to get the info to tell the nurse but she did not answer. Thanks!

## 2019-03-16 NOTE — Telephone Encounter (Signed)
Patient is sch for the EMG with Posey Pronto on 04-05-19 and she is aware of the appt

## 2019-03-16 NOTE — Telephone Encounter (Signed)
Patient stated that the last two mornings her left hand was numb, and this morning both hands were numb. She tried to send a message on mychart and was unable. She is unsure if this is normal or not. Please advise on how she should proceed?

## 2019-03-16 NOTE — Telephone Encounter (Signed)
Please inform patient that I would like to get a nerve testing to see what's causing these symptoms. Sometimes nerve impingement either from the neck or at the wrist (carpal tunnel syndrome) can do this.  If she is agreeable, please order NCS/EMG bilateral upper extremities and have the front schedule it. Thanks.

## 2019-03-16 NOTE — Telephone Encounter (Signed)
Please schedule order is in and patient is aware

## 2019-04-05 ENCOUNTER — Other Ambulatory Visit: Payer: Self-pay | Admitting: *Deleted

## 2019-04-05 ENCOUNTER — Ambulatory Visit (INDEPENDENT_AMBULATORY_CARE_PROVIDER_SITE_OTHER): Payer: Medicare Other | Admitting: Neurology

## 2019-04-05 ENCOUNTER — Other Ambulatory Visit: Payer: Self-pay

## 2019-04-05 DIAGNOSIS — G2581 Restless legs syndrome: Secondary | ICD-10-CM

## 2019-04-05 DIAGNOSIS — G629 Polyneuropathy, unspecified: Secondary | ICD-10-CM | POA: Diagnosis not present

## 2019-04-05 DIAGNOSIS — R202 Paresthesia of skin: Secondary | ICD-10-CM

## 2019-04-05 MED ORDER — GABAPENTIN 100 MG PO CAPS: 200.0000 mg | ORAL_CAPSULE | Freq: Every day | ORAL | 3 refills | Status: DC

## 2019-04-05 NOTE — Patient Outreach (Signed)
[facility]) Care Management  [place]  04/05/2019   Priscilla Cantrell 1935-08-21 [id number]  [name] received  telephone call from patient.  [employer] compliance verified.Patient had questions on getting Trelegy refilled. RN referred her to [place] for that information. Per patient she is taking her medications as prescribed. Patient has not been going out as much since COVID and stated that she is lonely. Per patient it is her and her dog. She sees her daughter every once in a while. Patient stated she is now going back to church. Patient is able to drive self. Patient has received her 2 COVID vaccines. Patient is using her oxygen at night.Patient is in the green zone. Per patient she does have numbness in her fingers get col feeling at night. They feel  Better with gloves on.  Patient has agreed to further outreach calls.      Encounter Medications:  Outpatient Encounter Medications as of 04/05/2019  Medication Sig  . albuterol (PROAIR HFA) 108 (90 Base) MCG/ACT inhaler Inhale 2 puffs into the lungs every 4 (four) hours as needed for wheezing or shortness of breath.  Marland Kitchen albuterol (PROVENTIL) (2.5 MG/3ML) 0.083% nebulizer solution INHALE 1 VIAL VIA NEBULIZER EVERY 6 HOURS AS NEEDED FOR WHEEZING OR SHORTNESS OF BREATH  . amLODipine (NORVASC) 5 MG tablet Take 1 tablet (5 mg total) by mouth daily.  . Artificial Tear Solution (SOOTHE XP) SOLN Place 1 drop into both eyes 2 (two) times daily as needed (irritation).  Marland Kitchen aspirin EC 81 MG tablet Take 81 mg by mouth daily.  Marland Kitchen azelastine (ASTELIN) 0.1 % nasal spray Place 2 sprays into both nostrils 2 (two) times daily. Use in each nostril as directed (Patient taking differently: Place 2 sprays into both nostrils 2 (two) times daily as needed for rhinitis or allergies. Use in each nostril as directed)  . diazepam (VALIUM) 5 MG tablet TAKE 1 TABLET BY MOUTH EVERY NIGHT AT BEDTIME AND MAY TAKE AN ADDITIONAL TABLET AS NEEDED FOR  ANXIETY  . diclofenac sodium (VOLTAREN) 1 % GEL Apply 4 g topically 4 (four) times daily.  . diphenhydrAMINE (BENADRYL) 25 MG tablet Take 25 mg by mouth 3 (three) times daily as needed for allergies.  . fluorometholone (FML) 0.1 % ophthalmic suspension Place 1 drop into both eyes as directed. Tapering off  . fluticasone (FLONASE) 50 MCG/ACT nasal spray Place 1 spray into both nostrils daily. (Patient taking differently: Place 1 spray into both nostrils as needed. )  . Fluticasone-Umeclidin-Vilant (TRELEGY ELLIPTA) 100-62.5-25 MCG/INH AEPB Inhale 1 puff into the lungs daily.  . furosemide (LASIX) 20 MG tablet 1 tab po qd IF NEEDED for increased fluid collection in legs  . gabapentin (NEURONTIN) 100 MG capsule Take 1 tablet at bedtime x 2 weeks, then increase to 2 tablets at bedtime, if no sedation.  Marland Kitchen LACTASE ENZYME FAST ACTING PO Take 9,000 Units by mouth as needed.  . montelukast (SINGULAIR) 10 MG tablet Take 1 tablet (10 mg total) by mouth at bedtime.  . Probiotic Product (PROBIOTIC DAILY PO) Take 1 capsule by mouth daily.   . roflumilast (DALIRESP) 500 MCG TABS tablet Take 1 tablet (500 mcg total) by mouth daily.  Marland Kitchen SYNTHROID 88 MCG tablet Take 1 tablet (88 mcg total) by mouth daily.  Marland Kitchen triamcinolone cream (KENALOG) 0.1 % Apply 1 application topically 2 (two) times daily.  Marland Kitchen venlafaxine (EFFEXOR) 50 MG tablet TAKE 1 TABLET BY MOUTH  TWICE DAILY   No facility-administered  encounter medications on file as of 04/05/2019.    Functional Status:  In your present state of health, do you have any difficulty performing the following activities: 04/05/2019 08/20/2018  Hearing? N N  Vision? N N  Difficulty concentrating or making decisions? - N  Walking or climbing stairs? N N  Dressing or bathing? N N  Doing errands, shopping? N N  Preparing Food and eating ? N -  Using the Toilet? N -  In the past six months, have you accidently leaked urine? Y -  Comment at night when sleeping -  Do you have  problems with loss of bowel control? N -  Managing your Medications? N -  Managing your Finances? Y -  Comment for medications -  Housekeeping or managing your Housekeeping? N -  Some recent data might be hidden    Fall/Depression Screening: Fall Risk  04/05/2019 02/10/2019 01/19/2019  Falls in the past year? _0 Number falls in past yr: 1 0 0  Injury with Fall? 1 0 1  Comment - - -  Risk for fall due to : History of fall(s) - History of fall(s)  Risk for fall due to: Comment - - -  Follow up Falls evaluation completed;Falls prevention discussed - Falls evaluation completed;Falls prevention discussed  Comment - - -   PHQ 2/9 Scores 04/05/2019 01/19/2019 11/03/2018 11/03/2018 08/20/2018 08/02/2018 08/02/2018  PHQ - 2 Score 1 0 0 0 0 0 0  PHQ- 9 Score - - - - - - -   [facility] CM Care Plan Problem One     Most Recent Value  Care Plan Problem One  Knowledge deficit in self management of COPD  Role Documenting the Problem One  [facility] [place] for Problem One  Active  [facility]N Long Term Goal   Patient will not have any readmissions for Copd exacerbation within the next 90 days  [facility]N Long Term Goal Start Date  04/05/19  Interventions for Problem One Long Term Goal  Rn reiterates medication adherence.RN reiterates the zones and action plan. RN will follow up for further discussion  [facility]N CM Short Term Goal #1   Patient will verbalize having a better understanding of the Eating Plan for COPD patient within the next 30 days  [facility] CM Short Term Goal #1 Met Date  04/05/19  [facility] CM Short Term Goal #2   Patient will verbalize having a better understanding of COPD and physical activity  THN CM Short Term Goal #2 Met Date  04/05/19  [facility] CM Short Term Goal #3  Patient will verbalize understanding Health Maintenance within the next 30 days  THN CM Short Term Goal #3 Start Date  04/05/19  Interventions for Short Tern Goal #3  RN discussed Health Maintenance.RN discussed pandemic precautions. Patient received  COVID shots x 2. RN discussed eye care. RN will follow up with further discussion     . Assessment:  Patient has received COVID vaccines Patient is in green zone Patient is taking medications as per ordered Patient has not had any recent falls  Plan:  RN discussed zones and action plan RN discussed health maintenance RN discussed medication adherence RN will follow up later within the month March   [place] Management [phone]

## 2019-04-05 NOTE — Procedures (Signed)
[facility]  [place], [place]  [place], [place] [address] Tel: [phone] Fax:  [phone] Test Date:  04/05/2019  Patient: Priscilla Cantrell DOB: [birth year] Physician: Narda Amber, DO  Sex: Female Height: 5' " Ref Phys: Narda Amber, DO  ID#: [id number] Temp: 32.0C Technician:    Patient Complaints: This is an 84 year-old female referred for evaluation of bilateral hand paresthesias, worse on the left.  NCV & EMG Findings: Extensive electrodiagnostic testing of the right upper extremity and additional studies of the left shows:  1. Bilateral median, ulnar, and mixed palmar sensory responses are within normal limits. 2. Bilateral median motor responses showed reduced amplitude and greater proximal response, consistent with a [name]-Gruber anastomoses.  Bilateral ulnar motor responses are within normal limits. 3. There is no evidence of active or chronic motor axonal loss changes affecting any of the tested muscles.  Motor unit configuration and recruitment pattern is within normal limits.  Impression: This is a normal study of the upper extremities.  In particular, there is no evidence of carpal tunnel syndrome, ulnar neuropathy, or cervical radiculopathy affecting the upper extremities.  Incidentally, there is evidence of bilateral [name]-Gruber anastomoses, a normal anatomic variant.   ___________________________ Narda Amber, DO    Nerve Conduction Studies Anti Sensory Summary Table   Stim Site NR Peak (ms) Norm Peak (ms) P-T Amp (V) Norm P-T Amp  Left Median Anti Sensory (2nd Digit)  32C  Wrist    3.6 <3.8 20.3 >10  Right Median Anti Sensory (2nd Digit)  32C  Wrist    3.2 <3.8 17.7 >10  Left Ulnar Anti Sensory (5th Digit)  32C  Wrist    3.1 <3.2 24.6 >5  Right Ulnar Anti Sensory (5th Digit)  32C  Wrist    3.0 <3.2 25.9 >5   Motor Summary Table   Stim Site NR Onset (ms) Norm Onset (ms) O-P Amp (mV) Norm O-P Amp Site1 Site2 Delta-0 (ms) Dist  (cm) Vel (m/s) Norm Vel (m/s)  Left Median Motor (Abd Poll Brev)  32C  Wrist    3.4 <4.0 3.6 >5 Elbow Wrist 5.0 25.0 50 >50  Elbow    8.4  3.1  Ulnar-wrist crossover Elbow 5.1 0.0    Ulnar-wrist crossover    3.3  2.9         Right Median Motor (Abd Poll Brev)  32C  Wrist    3.3 <4.0 3.9 >5 Elbow Wrist 4.7 26.0 55 >50  Elbow    8.0  4.1  Ulnar-wrist crossover Elbow 4.0 0.0    Ulnar-wrist crossover    4.0  3.1         Left Ulnar Motor (Abd Dig Minimi)  32C  Wrist    2.4 <3.1 11.5 >7 B Elbow Wrist 3.5 21.0 60 >50  B Elbow    5.9  10.7  A Elbow B Elbow 1.6 10.0 63 >50  A Elbow    7.5  10.3         Right Ulnar Motor (Abd Dig Minimi)  32C  Wrist    2.4 <3.1 11.2 >7 B Elbow Wrist 3.4 20.0 59 >50  B Elbow    5.8  10.4  A Elbow B Elbow 1.9 10.0 53 >50  A Elbow    7.7  10.1          Comparison Summary Table   Stim Site NR Peak (ms) Norm Peak (ms) P-T Amp (V) Site1 Site2 Delta-P (ms) Norm Delta (ms)  Left Median/Ulnar Palm Comparison (Wrist - 8cm)  32C  Median Palm    2.0 <2.2 47.6 Median Palm Ulnar Palm 0.2   Ulnar Palm    1.8 <2.2 15.4      Right Median/Ulnar Palm Comparison (Wrist - 8cm)  32C  Median Palm    2.0 <2.2 41.0 Median Palm Ulnar Palm 0.2   Ulnar Palm    1.8 <2.2 24.6       EMG   Side Muscle Ins Act Fibs Psw Fasc Number Recrt Dur Dur. Amp Amp. Poly Poly. Comment  Right 1stDorInt Nml Nml Nml Nml Nml Nml Nml Nml Nml Nml Nml Nml N/A  Right PronatorTeres Nml Nml Nml Nml Nml Nml Nml Nml Nml Nml Nml Nml N/A  Right Biceps Nml Nml Nml Nml Nml Nml Nml Nml Nml Nml Nml Nml N/A  Right Triceps Nml Nml Nml Nml Nml Nml Nml Nml Nml Nml Nml Nml N/A  Right Deltoid Nml Nml Nml Nml Nml Nml Nml Nml Nml Nml Nml Nml N/A  Left 1stDorInt Nml Nml Nml Nml Nml Nml Nml Nml Nml Nml Nml Nml N/A  Left PronatorTeres Nml Nml Nml Nml Nml Nml Nml Nml Nml Nml Nml Nml N/A  Left Biceps Nml Nml Nml Nml Nml Nml Nml Nml Nml Nml Nml Nml N/A  Left Triceps Nml Nml Nml Nml Nml Nml Nml Nml Nml Nml Nml Nml N/A    Left Deltoid Nml Nml Nml Nml Nml Nml Nml Nml Nml Nml Nml Nml N/A      Waveforms:

## 2019-04-05 NOTE — Progress Notes (Signed)
    Follow-up Visit   Date: 04/05/19   Priscilla Cantrell MRN: [id number] DOB: 08-15-1935   Interim History: Priscilla Cantrell is a 84 y.o. right-handed Caucasian female with GERD, hypertension, cervical canal stenosis returning to the clinic for follow-up of neuropathy, bilateral hand discomfort, and RLS.  The patient was accompanied to the clinic by self. She is here for electrodiagnostic testing of the hands.  Last month, she was waking up with severe cold sensation of the left hand which then also involve the right side.  She has been the sleeping with gloves and no longer experiences this sensation.  She denies any weakness.  She has known cervical canal stenosis at C5-6 and responded well to physical therapy.  She is requesting refills for gabapentin 200 mg at bedtime, which she takes for restless leg syndrome.   Data: NCS/EMG bilateral upper extremities 04/05/2019:   This is a normal study of the upper extremities.  In particular, there is no evidence of carpal tunnel syndrome, ulnar neuropathy, or cervical radiculopathy affecting the upper extremities.  Incidentally, there is evidence of bilateral [name]-Gruber anastomoses, a normal anatomic variant.  MRI cervical spine 12/03/2018: 1. Moderate bilateral foraminal stenosis at C5-6 which could affect either or both C6 nerves. 2. Slight compression of the ventral aspect of the spinal cord at C4-5 and C5-6 without myelopathy.  IMPRESSION/PLAN: 1. Left > right hand dysesthesias, improved.  Results of electrodiagnostic testing was discussed with patient which is normal.  Specifically, there is no evidence of entrapment neuropathy.  2.  Restless leg syndrome, stable.  Refills sent for gabapentin 200mg  at bedtime  3.  Idiopathic peripheral neuropathy manifesting with numbness, stable   4.  Cervical canal cervical spondylosis with foraminal stenosis at C5-6 and spinal cord stenosis at C4-5 and C5-6.  She responded well to neck PT which  significantly improved overall strength and stability.  If she develop new leg or arm symptoms or neck pain, OK to restart PT.  Thank you for allowing me to participate in patient's care.  If I can answer any additional questions, I would be pleased to do so.    Sincerely,    Dollene Mallery K. Posey Pronto, DO

## 2019-04-06 ENCOUNTER — Telehealth: Payer: Self-pay | Admitting: Pulmonary Disease

## 2019-04-06 DIAGNOSIS — J9611 Chronic respiratory failure with hypoxia: Secondary | ICD-10-CM

## 2019-04-06 DIAGNOSIS — J449 Chronic obstructive pulmonary disease, unspecified: Secondary | ICD-10-CM

## 2019-04-06 MED ORDER — TRELEGY ELLIPTA 100-62.5-25 MCG/INH IN AEPB: 1.0000 | INHALATION_SPRAY | Freq: Every day | RESPIRATORY_TRACT | 0 refills | Status: DC

## 2019-04-06 NOTE — Telephone Encounter (Signed)
Message routed to Johny Shock, RN with [facility]  Information below has been forwarded to you as an FYI as this patient has a follow up appointment with you on 04/20/19.  Based on the medication list the tatient was issued Trelegy on 08/04/18 with 11 refills. And does not need additional refills at this time.  However, I called the patient and she stated she only had 5 days worth of the Trelegy left. And was advised by [facility] that she will have to pay $600 before she would be able to obtain assistance to get the medication. Which she cannot afford. Patient stated her insurance would not cover the medication as of February 04, 2019.  Patient was provided 2 samples of Trelegy that will hold her over until she has been able to have her appointment with [facility] on 04/20/19. Patient stated she will come to the clinic today to pick them up.  Does [facility] engage any patient assistance programs to help the patient obtain the medication at a reduced cost?

## 2019-04-07 ENCOUNTER — Other Ambulatory Visit: Payer: Self-pay | Admitting: Pharmacist

## 2019-04-07 ENCOUNTER — Telehealth: Payer: Self-pay | Admitting: Pulmonary Disease

## 2019-04-07 DIAGNOSIS — J9611 Chronic respiratory failure with hypoxia: Secondary | ICD-10-CM

## 2019-04-07 DIAGNOSIS — J449 Chronic obstructive pulmonary disease, unspecified: Secondary | ICD-10-CM

## 2019-04-07 MED ORDER — TRELEGY ELLIPTA 100-62.5-25 MCG/INH IN AEPB: 1.0000 | INHALATION_SPRAY | Freq: Every day | RESPIRATORY_TRACT | 4 refills | Status: DC

## 2019-04-07 NOTE — Telephone Encounter (Signed)
-----  Message from Elayne Guerin, [facility] sent at 04/07/2019  4:02 PM EST ----- Regarding: Trelegy Good afternoon!  I am trying to help [name] with Trelegy. Since she hasn't met [employer]'s medication expense requirement yet, we will need to have a new prescription sent to her pharmacy. Would you please send in a new prescription for Trelegy to [facility]?  (It looks like the prescription from yesterday was for samples and it was printed vs being sent to [facility]).  Thank you so much!  Have a great day,  Elayne Guerin, PharmD, [place] Clinical Pharmacist [phone]

## 2019-04-07 NOTE — Telephone Encounter (Signed)
04/07/2019  C,  Please see the note below.  Can we please refill the patient's Trelegy Ellipta and sent to the [facility] on file.  Wyn Quaker, FNP

## 2019-04-07 NOTE — Telephone Encounter (Signed)
RX has been sent to [facility] in [place].   Will close encounter.

## 2019-04-07 NOTE — Patient Outreach (Addendum)
[facility]) Care Management  04/07/2019  ABAGAYLE KLUTTS 1935/03/19 [id number]   Patient was called regarding medication assistance. HIPAA identifiers were obtained.  Patient said she was at a restaurant at the time of my call so a medication review could not be completed.  Patient said she was told the retail price of Trelegy would be >$700 and she did not know how much it would be with her insurance.  Patient said she thought she had a deductible but was also not sure. Since she was at [employer], we were not able to call her insurance on conference call.  [facility] in [place] was called. They did not have an active prescription on file for the patient to fill.    A message was sent the CMA with her Provider's office requesting a new prescription be sent to [facility]  Last year, patient was able to receive Trelegy, Daliresp, and Ventolin via patient assistance programs.   Daliresp is available through [employer]'s Patient Assistance Program. For 2021, they have relaxed their out-of-pocket expenditure requirement.  Elayne Guerin, PharmD, BCACP [facility] Clinical Pharmacist [phone]  [facility]) Care Management  [place]   04/07/2019  TONEISHA SAVARY 82-21-1937 [id number]  Reason for referral: medication assistance  Referral source: Provider Referral medication(s): Trelegy Current insurance:[employer]  HPI:  Patient is an 84 year old female with multiple medical conditions including but not limited to:  Allergic rhinitis, COPD, depression with anxiety, hypothyroidism, osteoarthritis and spinal stenosis.    [facility] assisted the patient with medication assistance with Trelegy, Daliresp and Ventolin HFA last year.  She wondered about the cost of Trelegy for this year and how to access patient assistance.  Objective: Allergies  Allergen Reactions  . Losartan Other (See Comments)    hyperkalemia  . Penicillins Hives,  Swelling and Rash    Has patient had a PCN reaction causing immediate rash, facial/tongue/throat swelling, SOB or lightheadedness with hypotension: YES Has patient had a PCN reaction causing severe rash involving mucus membranes or skin necrosis: NO Has patient had a PCN reaction that required hospitalization NO Has patient had a PCN reaction occurring within the last 10 years: NO If all of the above answers are "NO", then may proceed with Cephalosporin use.   . Cefixime [Kdc:Cefixime] Other (See Comments)    unspecified  . Indocin [Indomethacin] Other (See Comments)    Painful tongue and throat  . Singulair [Montelukast Sodium]     Chest tightness   . Sulfa Antibiotics Other (See Comments)    unspecified  . Ace Inhibitors Other (See Comments)    cough  . Statins Other (See Comments)    Myalgias     Medications Reviewed Today    Reviewed by Elayne Guerin, [facility] (Pharmacist) on 04/07/19 at 1647  Med List Status: <None>  Medication Order Taking? Sig Documenting Provider Last Dose Status Informant  albuterol (PROAIR HFA) 108 (90 Base) MCG/ACT inhaler [id number] Yes Inhale 2 puffs into the lungs every 4 (four) hours as needed for wheezing or shortness of breath. [name], MD Taking Active   albuterol (PROVENTIL) (2.5 MG/3ML) 0.083% nebulizer solution [id number] Yes INHALE 1 VIAL VIA NEBULIZER EVERY 6 HOURS AS NEEDED FOR WHEEZING OR SHORTNESS OF BREATH McGowen, Adrian Blackwater, MD Taking Active   amLODipine (NORVASC) 5 MG tablet [id number] Yes Take 1 tablet (5 mg total) by mouth daily. McGowen, Adrian Blackwater, MD Taking Active   Artificial Tear Solution (SOOTHE XP) SOLN [id number]  Yes Place 1 drop into both eyes 2 (two) times daily as needed (irritation). [provider] Taking Active Self  aspirin EC 81 MG tablet [id number] Yes Take 81 mg by mouth daily. [provider] Taking Active   azelastine (ASTELIN) 0.1 % nasal spray [id number] Yes Place 2 sprays into both nostrils 2 (two)  times daily. Use in each nostril as directed  Patient taking differently: Place 2 sprays into both nostrils 2 (two) times daily as needed for rhinitis or allergies. Use in each nostril as directed   McGowen, Adrian Blackwater, MD Taking Active Self  diazepam (VALIUM) 5 MG tablet [id number] Yes TAKE 1 TABLET BY MOUTH EVERY NIGHT AT BEDTIME AND MAY TAKE AN ADDITIONAL TABLET AS NEEDED FOR ANXIETY McGowen, Adrian Blackwater, MD Taking Active   fluorometholone (FML) 0.1 % ophthalmic suspension [id number] Yes Place 1 drop into both eyes as directed. Tapering off [provider] Taking Active   Fluticasone-Umeclidin-Vilant (TRELEGY ELLIPTA) 100-62.5-25 MCG/INH AEPB [id number] Yes Inhale 1 puff into the lungs daily. Martyn Ehrich, NP Taking Active            Med Note [facility], [facility]   Thu Sep 23, 2018  1:35 PM)    Fluticasone-Umeclidin-Vilant (TRELEGY ELLIPTA) 100-62.5-25 MCG/INH AEPB [id number] Yes Inhale 1 puff into the lungs daily. Chesley Mires, MD Taking Active   furosemide (LASIX) 20 MG tablet [id number] No 1 tab po qd IF NEEDED for increased fluid collection in legs  Patient not taking: Reported on 04/07/2019   [name], MD Not Taking Active   gabapentin (NEURONTIN) 100 MG capsule [id number] Yes Take 2 capsules (200 mg total) by mouth at bedtime. Narda Amber K, DO Taking Active   montelukast (SINGULAIR) 10 MG tablet [id number] Yes Take 1 tablet (10 mg total) by mouth at bedtime. [name], MD Taking Active   Probiotic Product (PROBIOTIC DAILY PO) [id number] Yes Take 1 capsule by mouth daily.  [provider] Taking Active Self  roflumilast (DALIRESP) 500 MCG TABS tablet [id number] Yes Take 1 tablet (500 mcg total) by mouth daily. Lauraine Rinne, NP Taking Active   SYNTHROID 88 MCG tablet [id number] Yes Take 1 tablet (88 mcg total) by mouth daily. [name], MD Taking Active   triamcinolone cream (KENALOG) 0.1 % [id number] No Apply 1 application topically 2 (two) times daily.   Patient not taking: Reported on 04/07/2019   Howard Pouch A, DO Not Taking Active Self  venlafaxine (EFFEXOR) 50 MG tablet [id number] Yes TAKE 1 TABLET BY MOUTH  TWICE DAILY McGowen, Adrian Blackwater, MD Taking Active          Assessment:  Medication Assistance Findings:  Medication assistance needs identified: Daliresp, Trelegy, and Ventolin HFA  Patient is over income for LIS/Extra Help  [facility] was called with the patient on conference call.  Findings:  1.  She does not have a deductible. 2.  She currently has spent around $178 in copays 3.  If she were to get Trelegy today, it would cost her $45 for a 90 day day supply or  $120   She has not met the medication expense requirement for [employer] to get Trelegy and Ventolin HFA Additional medication assistance options reviewed with patient as warranted:  No other options identified  Plan: I will route patient assistance letter to [place] technician who will coordinate patient assistance program application process for medications listed above.  [facility] pharmacy technician will assist with obtaining all required documents from both patient  and provider(s) and submit application(s) once completed.    Sharee Pimple [place] will follow for medication assistance.  Follow up with the patient in 4-6 weeks.   Elayne Guerin, PharmD, [place] Clinical Pharmacist [phone]

## 2019-04-11 ENCOUNTER — Other Ambulatory Visit: Payer: Self-pay | Admitting: Pharmacy Technician

## 2019-04-11 NOTE — Patient Outreach (Signed)
[facility]) Care Management  04/11/2019  MELLINA KASSAY [birth year] [id number]                                        Medication Assistance Referral  Referral From: [place]  Medication/Company: [name] / [employer] Patient application portion:  Mailed Provider application portion: Faxed  to Wyn Quaker, NP Provider address/fax verified via: Office website    Follow up:  Will follow up with patient in 10-15 business days to confirm application(s) have been received.  Trice Aspinall P. Loana Salvaggio, [place]  [phone]

## 2019-04-14 ENCOUNTER — Other Ambulatory Visit: Payer: Self-pay

## 2019-04-18 ENCOUNTER — Ambulatory Visit: Payer: Medicare Other | Admitting: Obstetrics & Gynecology

## 2019-04-19 ENCOUNTER — Ambulatory Visit: Payer: Medicare Other | Admitting: Obstetrics & Gynecology

## 2019-04-19 ENCOUNTER — Encounter: Payer: Self-pay | Admitting: Family Medicine

## 2019-04-20 ENCOUNTER — Other Ambulatory Visit: Payer: Self-pay | Admitting: *Deleted

## 2019-04-20 ENCOUNTER — Ambulatory Visit (INDEPENDENT_AMBULATORY_CARE_PROVIDER_SITE_OTHER): Payer: Medicare Other | Admitting: Obstetrics & Gynecology

## 2019-04-20 ENCOUNTER — Encounter: Payer: Self-pay | Admitting: Obstetrics & Gynecology

## 2019-04-20 ENCOUNTER — Other Ambulatory Visit: Payer: Self-pay

## 2019-04-20 VITALS — BP 134/80

## 2019-04-20 DIAGNOSIS — N993 Prolapse of vaginal vault after hysterectomy: Secondary | ICD-10-CM

## 2019-04-20 DIAGNOSIS — R151 Fecal smearing: Secondary | ICD-10-CM

## 2019-04-20 NOTE — Progress Notes (Signed)
     Priscilla Cantrell Dec 29, 1935 [id number]        84 y.o.  G1P1L1 Widowed.  Daughter 82 yo.  RP: Vaginal bulging and stool incontinence  HPI: Vaginal bulging and stool incontinence.  Patient complaining of something protruding from the vagina intermittently over the past 1 1/2 to 2 yrs.  Most of the time it occurs whenever she is having issues with coughing or straining.  No pain associated with this.  No urinary issues to include pain with urination or any incontinence.  No issues with bowel movements to include constipation, diarrhea or stool trapping.  But does have mild stool incontinence, especially with loose stools.  Never a large amount of stool incontinence, just staining her underwear.  Status post TAH a number of years ago for which the patient cannot remember the indication but states that it was not serious.   OB History  Gravida Para Term Preterm AB Living  1 1       1   SAB TAB Ectopic Multiple Live Births               # Outcome Date GA Lbr Len/2nd Weight Sex Delivery Anes PTL Lv  1 Para             Past medical history,surgical history, problem list, medications, allergies, family history and social history were all reviewed and documented in the EPIC chart.   Directed ROS with pertinent positives and negatives documented in the history of present illness/assessment and plan.  Exam:  Vitals:   04/20/19 1233  BP: 134/80   General appearance:  Normal  Abdomen: Normal  Gynecologic exam: Vulva normal.  Laying down with valsalva:  Colpocele grade 2/3.  Standing with valsalva:  Colpocele grade 2-3/3, no protrusion at vulva.   Assessment/Plan:  84 y.o. G1P1   1. Vaginal vault prolapse after hysterectomy Very mildly symptomatic colpocele.  Surgery not recommended.  Patient declined the pessary.  Will avoid pelvic pressure as much as possible.  Recommendations reviewed.  2. Fecal smearing Dietary recommendations to avoid overly soft stools.  Recommend Kegel  exercises for the anal sphincter.  Physical therapy suggested, referral declined at this time.  Princess Bruins MD, 12:44 PM 04/20/2019

## 2019-04-20 NOTE — Patient Outreach (Signed)
[facility]) Care Management  04/20/2019  LALI POLLINS 11-29-1935 [id number]   RN Health Coach attempted follow up outreach call to patient.  Patient was unavailable. HIPPA compliance voicemail message left with return callback number.  Plan: RN will call patient again within 30 days.  [place] Care Management [phone]

## 2019-04-21 ENCOUNTER — Telehealth: Payer: Self-pay

## 2019-04-21 ENCOUNTER — Other Ambulatory Visit: Payer: Self-pay

## 2019-04-21 MED ORDER — VENLAFAXINE HCL 50 MG PO TABS: ORAL_TABLET | ORAL | 1 refills | Status: DC

## 2019-04-21 NOTE — Telephone Encounter (Signed)
Refill has been sent, patient notified.  

## 2019-04-21 NOTE — Telephone Encounter (Signed)
Patient is requesting venlafaxine (EFFEXOR) 50 MG tablet to be sent to [facility].

## 2019-04-28 ENCOUNTER — Encounter: Payer: Self-pay | Admitting: Obstetrics & Gynecology

## 2019-04-28 ENCOUNTER — Other Ambulatory Visit: Payer: Self-pay | Admitting: Pharmacy Technician

## 2019-04-28 NOTE — Patient Instructions (Signed)
1. Vaginal vault prolapse after hysterectomy Very mildly symptomatic colpocele.  Surgery not recommended.  Patient declined the pessary.  Will avoid pelvic pressure as much as possible.  Recommendations reviewed.  2. Fecal smearing Dietary recommendations to avoid overly soft stools.  Recommend Kegel exercises for the anal sphincter.  Physical therapy suggested, referral declined at this time.  Priscilla Cantrell, it was a pleasure meeting you today!

## 2019-04-28 NOTE — Patient Outreach (Signed)
[facility]) Care Management  04/28/2019  Priscilla Cantrell [birth year] [id number]   Successful call placed to patient regarding patient assistance application(s) for [employer] with [place] , HIPAA identifiers verified.   Patient informs she has not received the application that was mailed to her on 04/11/2019. She informs the mail in her town can be slow at times. Informed patient I would place another application in the mail to her today. Patient was agreeable to this plan.  Follow up:  Will follow up with patient in 5-7 business days to inquire if she has received the 2nd application.  Kristell Wooding P. Cleva Camero, [place]  [phone]

## 2019-05-05 ENCOUNTER — Other Ambulatory Visit: Payer: Self-pay | Admitting: Pharmacy Technician

## 2019-05-05 ENCOUNTER — Other Ambulatory Visit: Payer: Self-pay | Admitting: Family Medicine

## 2019-05-05 NOTE — Patient Outreach (Signed)
[facility]) Care Management  05/05/2019  Priscilla Cantrell Feb 10, 1935 [id number]    Unsuccessful call placed to patient regarding patient assistance application(s) for Daliresp with AZ&ME , HIPAA compliant voicemail left.   Was calling to inform patient that I received her applicatino back but it was not signed. Left HIPAA compliant voicemail requesting a return call.  Will re mail application to patient.  Follow up:  Will follow up with patient in 10-14 business days if call is not returned.  Shaarav Ripple P. Emersen Mascari, [place]  [phone]

## 2019-05-10 ENCOUNTER — Other Ambulatory Visit: Payer: Self-pay

## 2019-05-10 ENCOUNTER — Other Ambulatory Visit: Payer: Self-pay | Admitting: Pharmacy Technician

## 2019-05-10 NOTE — Patient Outreach (Signed)
[place] [facility]) Care Management  05/10/2019  Priscilla Cantrell 1935/12/27 [id number]  ADDENDUM  Incoming call received from patient in regards to AZ&ME application for [place].   Spoke to patient, HIPAA identifiers verified.  Patient informed she was placing the copy of her insurance card in the mail today. She informs she has not received the application back that she forgot to sign. She informed when she receives it then she would sign it and fax it back to me.  Will follow up with patient in 10-15 business days if information has not been returned.  Priscilla Cantrell P. [name], [place]  [phone]

## 2019-05-10 NOTE — Patient Outreach (Signed)
[facility] Care Management  05/10/2019  KATILYN DANGELO 1935-04-28 [id number]    Unsuccessful call placed to patient regarding patient assistance application(s) for Daliresp with AZ&ME , HIPAA compliant voicemail left.   Was returning patient's voicemail. Was calling to inform patient that her application was being returned to her to sign.  Follow up:  Will follow up with 2nd outreach attempt in 5-10 business days.  Keavon Sensing P. Haly Feher, [place]  [phone]

## 2019-05-11 ENCOUNTER — Encounter: Payer: Self-pay | Admitting: Family Medicine

## 2019-05-11 ENCOUNTER — Ambulatory Visit (INDEPENDENT_AMBULATORY_CARE_PROVIDER_SITE_OTHER): Payer: Medicare Other | Admitting: Family Medicine

## 2019-05-11 VITALS — BP 125/74 | HR 66 | Temp 98.3°F | Resp 16 | Ht 60.0 in | Wt 132.0 lb

## 2019-05-11 DIAGNOSIS — E039 Hypothyroidism, unspecified: Secondary | ICD-10-CM | POA: Diagnosis not present

## 2019-05-11 DIAGNOSIS — J449 Chronic obstructive pulmonary disease, unspecified: Secondary | ICD-10-CM

## 2019-05-11 DIAGNOSIS — E2839 Other primary ovarian failure: Secondary | ICD-10-CM

## 2019-05-11 DIAGNOSIS — I10 Essential (primary) hypertension: Secondary | ICD-10-CM | POA: Diagnosis not present

## 2019-05-11 DIAGNOSIS — Z1231 Encounter for screening mammogram for malignant neoplasm of breast: Secondary | ICD-10-CM | POA: Diagnosis not present

## 2019-05-11 NOTE — Progress Notes (Signed)
OFFICE VISIT  05/11/2019   CC:  Chief Complaint  Patient presents with  . Follow-up    RCI, pt is not fasting     HPI:    Patient is a 84 y.o. Caucasian female who presents for 3 mo f/u HTN, hypothyroidism, and LE venous insufficiency edema. She has copd with chronic hypoxic RF followed by [name].  A/P as of last visit: "1) HTN: needs to start monitoring again some at home. Initial bp up today but manual recheck normal.  Sounds like some home systolics are mildly up sometimes. No changes in med today. Plan lytes/cr at next f/u in 3 mo.  2) Hypothyroidism: doing well taking the med "incorrectly" as she has always done. Plan recheck TSH 3 mo (annual check).  3) Anxiety: doing fine on 5mg  valium bid.  4) COPD: doing very well, esp since getting on trelelgy ellipta. Continue prn oxygen daytime, 2L continuous hs.  5) Peripheral neuropathy: idiopathic. She has f/u with Dr. Posey Pronto tomorrow.  6) LE venous insufficiency edema; doing very well with low Na diet, compression stockings, and elevation.  Has lasix to use prn but has not had to use this lately.  7) REcurrent BPPV: recent 3-4 day spell of this spontaneously resolved about 4 days ago.   Interim hx: She got covid vaccine---2nd one was 04/14/19.  HTN: no home monitoring. Taking amlodipine daily.  Hypoth: takes AFTER eating in morning, with all other meds---always has.  LE: no legs swelling at all currently.  Has furosemide but she does not recall if she's taking just prn or daily.  RLS: better with use of gabapentin nightly-->neurol. Lack of sensation in feet (periph neurop) same. Anxiety: well controlled with regular use of diazepam.  COPD: no wheezing or coughing lately.  Has some DOE that is improved on her current inhalers.    ROS: no fevers, no CP, no dizziness, no HAs, no rashes, no melena/hematochezia.  No polyuria or polydipsia.  No myalgias or arthralgias.  No focal weakness, paresthesias, or tremors.   No acute vision or hearing abnormalities. No n/v/d or abd pain.  No palpitations.    Past Medical History:  Diagnosis Date  . Allergic rhinitis    with upper airway cough: as of 07/2017 pulm f/u she was instructed to use astelin, flonase, and saline more consistently  . BPPV (benign paroxysmal positional vertigo) 03/2018  . Chest pain, non-cardiac    Cardiac CT showed no signif obstructive dz, mildly elevated calcium level (Dr. [name]).  . Chronic cystitis with hematuria    [name]  . Chronic hypoxemic respiratory failure ([place]) 02/02/2015   2L oxygen 24/7  . Cold hands    NCS/EMGs normal.  Hand dysesthesias per neuro--reassured  . COPD (chronic obstructive pulmonary disease) (HCC)    GOLD 4.  spirometry 01/10/09 FEV 0.97(52%), FEV1% 47.  With chronic bronchitis as of 07/2017.  . DDD (degenerative disc disease), cervical    1998 MRI C5-6 impingemt (left).  MRI 2021 (neurol) 1 level canal stenosis, 1 level foraminal stenosis--->PT  . DDD (degenerative disc disease), lumbar   . Depression with anxiety 04/15/2011  . Diarrheal disease Summer 2017   ? 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).  As of 10/05/15 pt reports GI dx'd her with C diff and rx'd more flagyl and she is also on cholestyramine.  As of 02/2016, pt's sx's resolved completely.  . Diverticulosis of colon  Procto '97 and colonoscopy 2002  . Dysplastic nevus of upper extremity 04/2014   R tricep ([name])  . Edema of both lower extremities due to peripheral venous insufficiency    Varicosities bilat, swelling L>R.  R baker's cyst.  Got vein clinic eval 02/2018->sclerotherapy recommended but since no hemorrhage or ulceration, insurance will not cover the procedure.  . Family history of adverse reaction to anesthesia    mother died when patient age 71 receiving anesthesia for thyroid surgery  . Fibrocystic breast  disease    w/fibroadenoma.  Bx's showed NO atypia (Dr. Margot Chimes).  Mammo neg 2008.  Marland Kitchen GERD (gastroesophageal reflux disease)   . History of double vision    Ophthalmologist, Dr. Herbert Deaner, is further evaluating this with MRI  orbits and limited brain (myesthenia gravis testing neg 07/2010)  . History of home oxygen therapy    uses 2 liters at hs  . Hypertension    EKG 03/2010 normal  . Hypothyroidism    Hashimoto's, dx'd 1989  . Idiopathic peripheral neuropathy 04/2018   Sensory (numb feet) S1 distribution bilat, c/w spinal stenosis sensory neuropathy (no pain). 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  Labs Power, MRI C spine->some spondylosis/stenosis.  UE NCS/EMS: normal 04/2019.  Marland Kitchen Lesion of right native kidney 2017   found on CT 02/2015.  F/u MRI 03/31/15 showed it to be smaller and less likely of any significance but repeat CT renal protocol in 78mo was recommended by radiology.  This was done 10/26/15 and showed no interval change in the lesion, but b/c the lesion enhances with contrast, radiol rec'd urol consult (b/c pap renal RCC could not be excluded).  Saw urol 03/06/16--stable on u/s 09/2016 and 09/2017.  . Osteoarthritis, hip, bilateral    she is s/p bilat THA as of 02/2018.  Marland Kitchen Pneumonia   . Postmenopausal atrophic vaginitis    improved with estrace 2 X/week.  . Prolapse of vaginal cuff after hysterectomy    10/2018: Dr. Wayne Both  . Pulmonary nodule    CT chest 12/10, June 2011.  No nodule on CT 06/2010.  Marland Kitchen Recurrent Clostridium difficile diarrhea    Episode 09/19/2016: resolved with prolonged course of flagyl.  11/2016 recurrence treated with 10d of Dificid (Fidaxomicin) by GI (Dr. Earlean Shawl).  . Recurrent UTI    likely related to pt's atrophic vaginitis.  Urol started vaginal estrace 03/2016.  . Rib fracture 09/2018   R 7th, laterally-->sustained when her dog pulled her over and she fell.  Marland Kitchen RLS (restless legs syndrome)     neuro->gabapentin trial 2020/21  . Shingles 02/2014   R abd/flank  . Spinal stenosis of lumbar region with neurogenic claudication    2008 MRI (L3-4, L4-5, L5-S1).  +S1 distribution sensory loss bilat    Past Surgical History:  Procedure Laterality Date  . ABDOMINAL HYSTERECTOMY  1978   no BSO per pt--nonmalignant reasons  . APPENDECTOMY    . BREAST CYST EXCISION     Last screening mammogram 10/2010 was normal.  . CHOLECYSTECTOMY  2001  . COLONOSCOPY  04/2005; 12/06/2015   2007 NORMAL.  2017 adenomatous polyp x 1.  Mod sigmoid diverticulosis (Dr. Earlean Shawl).    Marland Kitchen DEXA  05/18/2017   NORMAL (T-score 0.1)  . HIP CLOSED REDUCTION Right 12/25/2015   Procedure: CLOSED REDUCTION HIP;  Surgeon: Nicholes Stairs, MD;  Location: [facility];  Service: Orthopedics;  Laterality: Right;  . NCV/EMS  10/2018   chronic and symmetric sensorimotor axonal polyneuropathy  affecting the lower extremities  . TONSILLECTOMY    . TOTAL HIP ARTHROPLASTY Right 11/01/2014   Procedure: RIGHT TOTAL HIP ARTHROPLASTY;  Surgeon: Latanya Maudlin, MD;  Location: [facility];  Service: Orthopedics;  Laterality: Right;  . TOTAL HIP ARTHROPLASTY Left 12/08/2017   Procedure: LEFT TOTAL HIP ARTHROPLASTY ANTERIOR APPROACH;  Surgeon: Paralee Cancel, MD;  Location: [facility];  Service: Orthopedics;  Laterality: Left;  70 mins  . TRANSTHORACIC ECHOCARDIOGRAM  08/2014   EF 60-65%, grade I diast dysfxn  . TUBAL LIGATION  1974    Outpatient Medications Prior to Visit  Medication Sig Dispense Refill  . albuterol (PROAIR HFA) 108 (90 Base) MCG/ACT inhaler Inhale 2 puffs into the lungs every 4 (four) hours as needed for wheezing or shortness of breath. 8.5 g 1  . amLODipine (NORVASC) 5 MG tablet Take 1 tablet (5 mg total) by mouth daily. 90 tablet 0  . Artificial Tear Solution (SOOTHE XP) SOLN Place 1 drop into both eyes 2 (two) times daily as needed (irritation).    Marland Kitchen aspirin EC 81 MG tablet Take 81 mg by mouth daily.    . clobetasol (TEMOVATE)  0.05 % external solution APPLY TO THE AFFECTED AREA EVERY DAY    . diazepam (VALIUM) 5 MG tablet TAKE 1 TABLET BY MOUTH EVERY NIGHT AT BEDTIME AND MAY TAKE AN ADDITIONAL TABLET AS NEEDED FOR ANXIETY 60 tablet 5  . fluorometholone (FML) 0.1 % ophthalmic suspension Place 1 drop into both eyes as directed. Tapering off    . Fluticasone-Umeclidin-Vilant (TRELEGY ELLIPTA) 100-62.5-25 MCG/INH AEPB Inhale 1 puff into the lungs daily. 60 each 4  . gabapentin (NEURONTIN) 100 MG capsule Take 2 capsules (200 mg total) by mouth at bedtime. 180 capsule 3  . montelukast (SINGULAIR) 10 MG tablet Take 1 tablet (10 mg total) by mouth at bedtime. 90 tablet 0  . Probiotic Product (PROBIOTIC DAILY PO) Take 1 capsule by mouth daily.     . roflumilast (DALIRESP) 500 MCG TABS tablet Take 1 tablet (500 mcg total) by mouth daily. 30 tablet 11  . SYNTHROID 88 MCG tablet Take 1 tablet (88 mcg total) by mouth daily. 30 tablet 5  . triamcinolone cream (KENALOG) 0.1 % Apply 1 application topically 2 (two) times daily. 30 g 0  . venlafaxine (EFFEXOR) 50 MG tablet TAKE 1 TABLET BY MOUTH  TWICE DAILY 180 tablet 1  . VITAMIN D PO Take by mouth daily.    . Fluticasone-Umeclidin-Vilant (TRELEGY ELLIPTA) 100-62.5-25 MCG/INH AEPB Inhale 1 puff into the lungs daily. 1 each 11  . albuterol (PROVENTIL) (2.5 MG/3ML) 0.083% nebulizer solution INHALE 1 VIAL VIA NEBULIZER EVERY 6 HOURS AS NEEDED FOR WHEEZING OR SHORTNESS OF BREATH (Patient not taking: Reported on 05/11/2019) 75 mL 1  . azelastine (ASTELIN) 0.1 % nasal spray Place 2 sprays into both nostrils 2 (two) times daily. Use in each nostril as directed (Patient not taking: Reported on 05/11/2019) 30 mL 3  . furosemide (LASIX) 20 MG tablet 1 tab po qd IF NEEDED for increased fluid collection in legs (Patient not taking: Reported on 05/11/2019) 30 tablet 3   No facility-administered medications prior to visit.    Allergies  Allergen Reactions  . Losartan Other (See Comments)     hyperkalemia  . Penicillins Hives, Swelling and Rash    Has patient had a PCN reaction causing immediate rash, facial/tongue/throat swelling, SOB or lightheadedness with hypotension: YES Has patient had a PCN reaction causing severe rash involving mucus membranes or skin necrosis:  NO Has patient had a PCN reaction that required hospitalization NO Has patient had a PCN reaction occurring within the last 10 years: NO If all of the above answers are "NO", then may proceed with Cephalosporin use.   . Cefixime [Kdc:Cefixime] Other (See Comments)    unspecified  . Indocin [Indomethacin] Other (See Comments)    Painful tongue and throat  . Singulair [Montelukast Sodium]     Chest tightness   . Sulfa Antibiotics Other (See Comments)    unspecified  . Ace Inhibitors Other (See Comments)    cough  . Statins Other (See Comments)    Myalgias     ROS As per HPI  PE: Blood pressure 125/74, pulse 66, temperature 98.3 F (36.8 C), temperature source Temporal, resp. rate 16, height 5' (1.524 m), weight 132 lb (59.9 kg), SpO2 95 %. Body mass index is 25.78 kg/m.  Gen: Alert, well appearing.  Patient is oriented to person, place, time, and situation. AFFECT: pleasant, lucid thought and speech. CV: RRR, no m/r/g.   LUNGS: CTA bilat, nonlabored resps, good aeration in all lung fields.  Mild/mod prolongation of exp phase. EXT: no clubbing or cyanosis.  no edema.    LABS:  Lab Results  Component Value Date   TSH 1.01 05/26/2018   Lab Results  Component Value Date   WBC 8.9 05/26/2018   HGB 13.7 05/26/2018   HCT 42.0 05/26/2018   MCV 86.9 05/26/2018   PLT 269.0 05/26/2018   Lab Results  Component Value Date   CREATININE 0.82 05/26/2018   BUN 16 05/26/2018   NA 142 05/26/2018   K 3.9 05/26/2018   CL 106 05/26/2018   CO2 26 05/26/2018   Lab Results  Component Value Date   ALT 11 05/26/2018   AST 12 05/26/2018   ALKPHOS 91 05/26/2018   BILITOT 0.6 05/26/2018   Lab Results   Component Value Date   CHOL 211 (H) 05/26/2018   Lab Results  Component Value Date   HDL 68.20 05/26/2018   Lab Results  Component Value Date   LDLCALC 127 (H) 05/26/2018   Lab Results  Component Value Date   TRIG 79.0 05/26/2018   Lab Results  Component Value Date   CHOLHDL 3 05/26/2018   Lab Results  Component Value Date   HGBA1C 5.7 10/29/2011    IMPRESSION AND PLAN:  1) HTN: The current medical regimen is effective;  continue present plan and medications. Lytes/cr today.  2) Hypothyroidism: takes med with food and other pills but says she has always done this. TSH level today.  3) Chronic anxiety: doing well with regular use of diazepam.  4) LE venous stasis edema: no problems with this lately. Has lasix to use prn.  5) Preventative health: ordered screening mammogram and DEXA today (to be done 08/2019).  6) COPD: stable/doing well on current med regimen.  She is followed by pulm.  7) RLS: doing well on neurontin 200 mg qhs per neurologist.  An After Visit Summary was printed and given to the patient.  FOLLOW UP: Return in about 3 months (around 08/10/2019) for routine chronic illness f/u.  Signed:  Crissie Sickles, MD           05/11/2019

## 2019-05-13 ENCOUNTER — Telehealth: Payer: Self-pay

## 2019-05-13 LAB — BASIC METABOLIC PANEL
BUN: 16 mg/dL (ref 6–23)
CO2: 26 mEq/L (ref 19–32)
Calcium: 9.1 mg/dL (ref 8.4–10.5)
Chloride: 106 mEq/L (ref 96–112)
Creatinine, Ser: 0.76 mg/dL (ref 0.40–1.20)
GFR: 72.85 mL/min (ref >60.00)
Glucose, Bld: 84 mg/dL (ref 70–99)
Potassium: 4 mEq/L (ref 3.5–5.1)
Sodium: 141 mEq/L (ref 135–145)

## 2019-05-13 LAB — TSH: TSH: 2.09 u[IU]/mL (ref 0.35–4.50)

## 2019-05-13 NOTE — Telephone Encounter (Signed)
Sent to Dr.McGowen as FYI °

## 2019-05-13 NOTE — Telephone Encounter (Signed)
Patient found a tick and had friend remove it yesterday. Offered patient an appointment. Patient declined. Patient stated she is going over to her friend's house to have her friend check to see if all of the tick is gone. Patient can see a small spot but she doesn't know if it a scab.

## 2019-05-13 NOTE — Telephone Encounter (Signed)
Noted  

## 2019-05-16 DIAGNOSIS — H04123 Dry eye syndrome of bilateral lacrimal glands: Secondary | ICD-10-CM | POA: Diagnosis not present

## 2019-05-16 DIAGNOSIS — H532 Diplopia: Secondary | ICD-10-CM | POA: Diagnosis not present

## 2019-05-17 ENCOUNTER — Encounter: Payer: Self-pay | Admitting: Family Medicine

## 2019-05-18 ENCOUNTER — Other Ambulatory Visit: Payer: Self-pay | Admitting: *Deleted

## 2019-05-18 ENCOUNTER — Telehealth: Payer: Self-pay | Admitting: Neurology

## 2019-05-18 DIAGNOSIS — H532 Diplopia: Secondary | ICD-10-CM

## 2019-05-18 NOTE — Patient Outreach (Signed)
[facility] [facility]) Care Management  05/18/2019  Priscilla Cantrell May 18, 1935 [id number]  RN Health Coach attempted follow up outreach call to patient.  Patient was unavailable. HIPPA compliance voicemail message left with return callback number.  Plan: RN will call patient again within 30 days.  [place] Care Management [phone]

## 2019-05-18 NOTE — Telephone Encounter (Signed)
Notes received from Dr. Quentin Ore at [facility] dated 05/16/2019.  Per notes, patient has fluctuating binocular diplopia which has been stable with prisms, however notes worsening and concern for myasthenia.   I contacted patient to elaborate on her history and she reports having diplopia which improves with eye closure, without fluctuation.  Symptoms are constant.  There is no ptosis, difficulty with swallowing, talking, or generalized weakness.  I will check myasthenia antibodies.  If this is normal, she will need to be referred to academic tertiary care center for single-fiber EMG as we do not perform this in the office.  Overall suspicion for myasthenia seems to be low if this is truly binocular diplopia, as mentioned by Dr. Kathlen Mody.  Wilmot Quevedo K. Posey Pronto, DO

## 2019-05-19 ENCOUNTER — Other Ambulatory Visit: Payer: Self-pay

## 2019-05-19 ENCOUNTER — Other Ambulatory Visit: Payer: Medicare Other

## 2019-05-19 ENCOUNTER — Other Ambulatory Visit: Payer: Self-pay | Admitting: Pharmacy Technician

## 2019-05-19 DIAGNOSIS — H532 Diplopia: Secondary | ICD-10-CM | POA: Diagnosis not present

## 2019-05-19 NOTE — Patient Outreach (Signed)
[facility]) Care Management  05/19/2019  Priscilla Cantrell Oct 06, 1935 [id number]   Successful call placed to patient regarding patient assistance application(s) for Daliresp with [place] , HIPAA identifiers verified.   Patient informed she has signed the application that was sent back to her and she plans to put it in the mail today.  Follow up:  Will route note to [place] for case closure if document(s) have not been received in the next 15 business days.  Mishawn Hemann P. [name], [place]  [phone]

## 2019-05-19 NOTE — Telephone Encounter (Signed)
Patient advised will come in. Directions given and ordered labs

## 2019-05-20 ENCOUNTER — Other Ambulatory Visit: Payer: Self-pay | Admitting: Pharmacist

## 2019-05-20 ENCOUNTER — Ambulatory Visit: Payer: Self-pay | Admitting: Pharmacist

## 2019-05-20 ENCOUNTER — Other Ambulatory Visit: Payer: Self-pay | Admitting: *Deleted

## 2019-05-20 NOTE — Patient Outreach (Signed)
[place] [facility]) Care Management  05/20/2019  Priscilla Cantrell 1935/12/13 [id number]    RN Health Coach attempted follow up outreach call to patient.  Patient was unavailable. HIPPA compliance voicemail message left with return callback number.  Plan: RN will send unsuccessful outreach letter   [place] Management [phone]

## 2019-05-20 NOTE — Patient Outreach (Addendum)
[facility]) Care Management  [place]  05/20/2019   Priscilla Cantrell 06-27-1935 [id number] [place] received return telephone call from patient.  Hipaa compliance verified. Per patient she is doing very well with her breathing. Patient is taking her oxygen when she goes out and keeps it in car if needed. Patient is not doing a regular exercise program but she walks her dog daily,  Per patient she is taking her medication as prescribed. Per patient she is having some problems with her vision seeing double if she takes her glasses off. Her Dr has ordered tests. Patient is awaiting results. Patient has received the COVID vaccine. Patient is eating a low sodium diet and is unable to tolerate milk products.Patient has agreed to follow up outreach calls.   Encounter Medications:  Outpatient Encounter Medications as of 05/20/2019  Medication Sig  . albuterol (PROAIR HFA) 108 (90 Base) MCG/ACT inhaler Inhale 2 puffs into the lungs every 4 (four) hours as needed for wheezing or shortness of breath.  Marland Kitchen albuterol (PROVENTIL) (2.5 MG/3ML) 0.083% nebulizer solution INHALE 1 VIAL VIA NEBULIZER EVERY 6 HOURS AS NEEDED FOR WHEEZING OR SHORTNESS OF BREATH (Patient not taking: Reported on 05/11/2019)  . amLODipine (NORVASC) 5 MG tablet Take 1 tablet (5 mg total) by mouth daily.  . Artificial Tear Solution (SOOTHE XP) SOLN Place 1 drop into both eyes 2 (two) times daily as needed (irritation).  Marland Kitchen aspirin EC 81 MG tablet Take 81 mg by mouth daily.  Marland Kitchen azelastine (ASTELIN) 0.1 % nasal spray Place 2 sprays into both nostrils 2 (two) times daily. Use in each nostril as directed (Patient not taking: Reported on 05/11/2019)  . clobetasol (TEMOVATE) 0.05 % external solution APPLY TO THE AFFECTED AREA EVERY DAY  . diazepam (VALIUM) 5 MG tablet TAKE 1 TABLET BY MOUTH EVERY NIGHT AT BEDTIME AND MAY TAKE AN ADDITIONAL TABLET AS NEEDED FOR ANXIETY  . fluorometholone (FML) 0.1 % ophthalmic suspension  Place 1 drop into both eyes as directed. Tapering off  . Fluticasone-Umeclidin-Vilant (TRELEGY ELLIPTA) 100-62.5-25 MCG/INH AEPB Inhale 1 puff into the lungs daily.  . furosemide (LASIX) 20 MG tablet 1 tab po qd IF NEEDED for increased fluid collection in legs (Patient not taking: Reported on 05/11/2019)  . gabapentin (NEURONTIN) 100 MG capsule Take 2 capsules (200 mg total) by mouth at bedtime.  . montelukast (SINGULAIR) 10 MG tablet Take 1 tablet (10 mg total) by mouth at bedtime.  . Probiotic Product (PROBIOTIC DAILY PO) Take 1 capsule by mouth daily.   . roflumilast (DALIRESP) 500 MCG TABS tablet Take 1 tablet (500 mcg total) by mouth daily.  Marland Kitchen SYNTHROID 88 MCG tablet Take 1 tablet (88 mcg total) by mouth daily.  Marland Kitchen triamcinolone cream (KENALOG) 0.1 % Apply 1 application topically 2 (two) times daily.  Marland Kitchen venlafaxine (EFFEXOR) 50 MG tablet TAKE 1 TABLET BY MOUTH  TWICE DAILY  . VITAMIN D PO Take by mouth daily.   No facility-administered encounter medications on file as of 05/20/2019.    Functional Status:  In your present state of health, do you have any difficulty performing the following activities: 04/05/2019 08/20/2018  Hearing? N N  Vision? N N  Difficulty concentrating or making decisions? - N  Walking or climbing stairs? N N  Dressing or bathing? N N  Doing errands, shopping? N N  Preparing Food and eating ? N -  Using the Toilet? N -  In the past six months, have you accidently  leaked urine? Y -  Comment at night when sleeping -  Do you have problems with loss of bowel control? N -  Managing your Medications? N -  Managing your Finances? Y -  Comment for medications -  Housekeeping or managing your Housekeeping? N -  Some recent data might be hidden    Fall/Depression Screening: Fall Risk  04/05/2019 02/10/2019 01/19/2019  Falls in the past year? '1 1 1  ' Number falls in past yr: 1 0 0  Injury with Fall? 1 0 1  Comment - - -  Risk for fall due to : History of fall(s) - History  of fall(s)  Risk for fall due to: Comment - - -  Follow up Falls evaluation completed;Falls prevention discussed - Falls evaluation completed;Falls prevention discussed  Comment - - -   PHQ 2/9 Scores 04/05/2019 01/19/2019 11/03/2018 11/03/2018 08/20/2018 08/02/2018 08/02/2018  PHQ - 2 Score 1 0 0 0 0 0 0  PHQ- 9 Score - - - - - - -   [facility] CM Care Plan Problem One     Most Recent Value  Care Plan Problem One  Knowledge deficit in self management of COPD  Role Documenting the Problem One  [facility] [place] for Problem One  Active  [facility] Long Term Goal   Patient will not have any readmissions for Copd exacerbation within the next 90 days  [facility] Long Term Goal Start Date  05/20/19  Interventions for Problem One Long Term Goal  RN reiterated the zones and action plan. RN discussed medication adherence.RN will follow up with further discussion  [facility] CM Short Term Goal #1   Patient will verbalize having a better understanding of the Eating Plan for COPD patient within the next 30 days  [facility] CM Short Term Goal #1 Met Date  05/20/19  [facility] CM Short Term Goal #2   Patient will verbalize having a better understanding of COPD and physical activity  [facility] CM Short Term Goal #2 Met Date  05/20/19  [facility] CM Short Term Goal #3  Patient will verbalize understanding Health Maintenance within the next 30 days  Interventions for Short Tern Goal #3  RN reiteratered the importance of HealthMaintenance. Patient has received her COVID vaccine. Patient will follow up with eye exam. RN will follow up with further discussion.      Assessment:  Patient is on a low sodium lactose free diet Patient has received her COVID vaccine Patient is being tested for double vision Patient is taking medications as prescribed except taking the synthroid and waiting 30 min-1 hr before eating.   Plan:  RN will send patient educations information on cholesterol Patient will continue to use pandemic precautions post vaccine RN will follow  up outreach within the month of July  Alyzae Hawkey [place] Management [phone]

## 2019-05-20 NOTE — Patient Outreach (Signed)
[facility] ([facility])  [facility] Quality Pharmacy Team    [facility] pharmacy case will be closed as our team is transitioning from the [facility] Care Management Department into the [facility] Quality Department and will no longer be using [facility] for documentation purposes.   [facility] pharmacy technician will continue to assist patient with medication assistance program applications until complete.       Yony Roulston J. Sashia Campas, PharmD, BCACP [facility] Clinical Pharmacist [phone]   

## 2019-05-26 ENCOUNTER — Other Ambulatory Visit: Payer: Self-pay | Admitting: Pharmacy Technician

## 2019-05-26 LAB — MYASTHENIA GRAVIS PANEL 2
A CHR BINDING ABS: <0.3 nmol/L
ACHR Blocking Abs: <15 % Inhibition (ref <15)
Acetylchol Modul Ab: <1 % Inhibition

## 2019-05-26 NOTE — Patient Outreach (Signed)
[facility]) Care Management  05/26/2019  Priscilla Cantrell Aug 17, 1935 [id number]  Received both patient and provider portion(s) of patient assistance application(s) for Daliresp. Faxed completed application and required documents into [employer].  Will follow up with company(ies) in 3-7 business days to check status of application(s).  [name], [place]  [phone]'

## 2019-05-27 ENCOUNTER — Other Ambulatory Visit: Payer: Self-pay

## 2019-05-27 DIAGNOSIS — H532 Diplopia: Secondary | ICD-10-CM

## 2019-05-27 NOTE — Progress Notes (Signed)
[facility] called to send referral they will call be back.  Number for [facility] [phone] opt. 2

## 2019-05-30 ENCOUNTER — Telehealth: Payer: Self-pay

## 2019-05-30 NOTE — Telephone Encounter (Signed)
-----   Message from Alda Berthold, DO sent at 05/27/2019 11:49 AM EDT ----- Please notify patient lab are within normal limits.  The next step is getting specializes nerve testing, where a small needle is inserted into the muscle around the eye to look for abnormalities in the nerve/muscle.  This test is only performed at a tertiary care center.    Please refer her to [place] for single fiber EMG - diagnosis diplopia (Call [phone] choose opt#3) -  pls fax telephone note from 4/14 and these labs. Thank you.

## 2019-05-30 NOTE — Telephone Encounter (Signed)
Please notify patient lab are within normal limits. The next step is getting specializes nerve testing, where a small needle is inserted into the muscle around the eye to look for abnormalities in the nerve/muscle. This test is only performed at a tertiary care center.    Referral faxed to [facility] at [phone]

## 2019-05-31 ENCOUNTER — Other Ambulatory Visit: Payer: Self-pay

## 2019-05-31 MED ORDER — AMLODIPINE BESYLATE 5 MG PO TABS: 5.0000 mg | ORAL_TABLET | Freq: Every day | ORAL | 0 refills | Status: DC

## 2019-06-01 ENCOUNTER — Other Ambulatory Visit: Payer: Self-pay | Admitting: Pharmacy Technician

## 2019-06-01 NOTE — Patient Outreach (Signed)
[facility]) Care Management  06/01/2019  LULIA FARR [birth year] [id number]   Care coordination call placed to [employer] in regards to patient's Daliresp application.  Spoke to [place] who informed patient has been APPROVED since 02/04/2019-02/03/2020. She informed next shipment is in progress as of 4/27/201 and should be arriving to patient's home in the next 5-10 business days.  Will follow up with patient in 7-10 business days to confirm medication was received and to discuss refill procedure.  Autumn Pruitt P. Ellakate Gonsalves, [place]  [phone]

## 2019-06-13 ENCOUNTER — Other Ambulatory Visit: Payer: Self-pay | Admitting: Pharmacy Technician

## 2019-06-13 NOTE — Patient Outreach (Signed)
[facility]) Care Management  06/13/2019  Priscilla Cantrell 09/20/1935 [id number]   Successful call placed to patient regarding patient assistance medication delivery of Daliresp with [employer], HIPAA identifiers verified.   Patient informed she received a 90 days supply of Daliresp from the patient assistance company. Discussed with patient the refill process which requires the patient to call [employer] approximately 2 weeks before running out of medication to request the refill. Informed patient where to find the phone number to the company and she was able to locate it while on the line. Patient verbalized understanding to the refill process and denies having any other questions or concerns. Confirmed patient had name and number if needed.  Follow up:  Will remove myself from care team as patient assistance has been completed.  Enrrique Mierzwa P. Chatara Lucente, [place]  [phone]

## 2019-06-23 ENCOUNTER — Other Ambulatory Visit: Payer: Self-pay

## 2019-06-23 ENCOUNTER — Ambulatory Visit (INDEPENDENT_AMBULATORY_CARE_PROVIDER_SITE_OTHER): Payer: Medicare Other | Admitting: Physician Assistant

## 2019-06-23 ENCOUNTER — Encounter: Payer: Self-pay | Admitting: Physician Assistant

## 2019-06-23 DIAGNOSIS — L309 Dermatitis, unspecified: Secondary | ICD-10-CM | POA: Diagnosis not present

## 2019-06-23 DIAGNOSIS — Z86007 Personal history of in-situ neoplasm of skin: Secondary | ICD-10-CM

## 2019-06-23 DIAGNOSIS — Z86018 Personal history of other benign neoplasm: Secondary | ICD-10-CM

## 2019-06-23 DIAGNOSIS — L089 Local infection of the skin and subcutaneous tissue, unspecified: Secondary | ICD-10-CM

## 2019-06-23 DIAGNOSIS — L821 Other seborrheic keratosis: Secondary | ICD-10-CM | POA: Diagnosis not present

## 2019-06-23 DIAGNOSIS — Z87898 Personal history of other specified conditions: Secondary | ICD-10-CM

## 2019-06-23 NOTE — Progress Notes (Signed)
   Follow up Visit  Subjective  Priscilla Cantrell is a 84 y.o. female who presents for the following: Skin Problem (Breakouts on face x several weeks. it comes and goes. Starts out like red bump with no head on it and then some are white heads and very sore. Some do have contents in them. Patient has not tried anything on them. ). Red bumps breaking out on face. Very sore. Sometimes they have pus in them and sometimes they don't. It has been going on for a few weeks. She has many milia that she picks at and cant get anything out of.     Objective  Well appearing patient in no apparent distress; mood and affect are within normal limits.  All skin waist up examined not including scalp. No suspicious moles noted on back.   Objective  Right Buccal Cheek : Inflamed papules and pustules lower face.   Objective  Right Upper Arm - Anterior: Dyspigmented scar. 2016  Objective  Right Thigh - Anterior: Dyspigmented scar. 2018  Assessment & Plan  Dermatitis Right Buccal Cheek   Bacterial culture.  Sample of Cerave BPO cleanser 3 times a week. 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

## 2019-06-27 ENCOUNTER — Telehealth: Payer: Self-pay

## 2019-06-27 NOTE — Telephone Encounter (Signed)
Phone call to patient with her bacteria culture results and [facility] [place] Clark-Bruning's recommendations.  Patient aware of results, patient stated that she's doing well with the topicals everything is starting to clear.  Jennifer Clark-Bruning aware.

## 2019-06-27 NOTE — Telephone Encounter (Signed)
-----   Message from Arlyss Gandy, [place] sent at 06/27/2019  1:22 PM EDT ----- No  need for oral antibiotic. How is she doing with topicals?

## 2019-06-29 LAB — ANAEROBIC AND AEROBIC CULTURE
MICRO NUMBER:: 10500996
MICRO NUMBER:: 10500997
SPECIMEN QUALITY:: ADEQUATE
SPECIMEN QUALITY:: ADEQUATE

## 2019-07-01 ENCOUNTER — Ambulatory Visit: Payer: Self-pay | Admitting: *Deleted

## 2019-07-08 ENCOUNTER — Other Ambulatory Visit: Payer: Self-pay

## 2019-07-11 ENCOUNTER — Other Ambulatory Visit: Payer: Self-pay

## 2019-07-11 MED ORDER — MONTELUKAST SODIUM 10 MG PO TABS: 10.0000 mg | ORAL_TABLET | Freq: Every day | ORAL | 0 refills | Status: DC

## 2019-07-20 ENCOUNTER — Telehealth: Payer: Self-pay | Admitting: Neurology

## 2019-07-20 NOTE — Telephone Encounter (Signed)
Faxed to [facility] [phone]

## 2019-07-20 NOTE — Telephone Encounter (Signed)
[facility] called in and wanted the notes for the patient's 04/05/19 appointment sent to them.

## 2019-08-01 ENCOUNTER — Encounter: Payer: Self-pay | Admitting: Family Medicine

## 2019-08-01 ENCOUNTER — Other Ambulatory Visit: Payer: Self-pay

## 2019-08-01 ENCOUNTER — Ambulatory Visit (INDEPENDENT_AMBULATORY_CARE_PROVIDER_SITE_OTHER): Payer: Medicare Other | Admitting: Family Medicine

## 2019-08-01 VITALS — BP 144/78 | HR 67 | Temp 98.1°F | Resp 16 | Ht 60.0 in | Wt 132.4 lb

## 2019-08-01 DIAGNOSIS — G3184 Mild cognitive impairment, so stated: Secondary | ICD-10-CM

## 2019-08-01 NOTE — Progress Notes (Signed)
OFFICE VISIT  08/09/2019   CC:  Chief Complaint  Patient presents with  . Mood changes    per patient, daughter and grandson believe she is exhibiting mood changes and would like PCP to have tests done.   HPI:    Patient is a 84 y.o. Caucasian female who presents for "mood changes". I last saw her about 2 mo ago. A/P as of last visit: "1) HTN: The current medical regimen is effective;  continue present plan and medications. Lytes/cr today.  2) Hypothyroidism: takes med with food and other pills but says she has always done this. TSH level today.  3) Chronic anxiety: doing well with regular use of diazepam.  4) LE venous stasis edema: no problems with this lately. Has lasix to use prn.  5) Preventative health: ordered screening mammogram and DEXA today (to be done 08/2019).  6) COPD: stable/doing well on current med regimen.  She is followed by pulm.  7) RLS: doing well on neurontin 200 mg qhs per neurologist."  INTERIM HX: She has been having persistent (long term) diplopia intermittently and her eye MD (Dr. Kathlen Mody at [facility]) made her neurologist, Dr. Posey Pronto, aware 05/18/19 and she requested MG lab and if neg recommended referral to tertiary care center for single fiber EMG. This lab was done 05/18/19 and was neg.  She is here b/c family says "I'm not acting right". She admits to having a female friend on Facebook, "that may turn into something more than friendship".  Family is afraid Ivalene is going to give him money.  She has never met the man in person.  He asked her for money b/c he said he broke his leg.  She says she gave him approx 10,000$---about 8-12 mo ago.  She says he has not asked for any money since then. She has maintained the same level of interaction with him since giving him money.  They have discussed possibly meeting in person but he told her he's "waiting on his covid shot". She says she was lonely and needed a friend, says her daughter and grandson  had stopped visiting her so she felt lonely and hurt, so the online friendship was something she turned to.  She denies depressed mood. She has chronic mild problem with word finding but o/w no problems cognitively or with memory difficulty--no changes recently.  Kemaria doesn't really want her daughter Manuela Schwartz or her grandson Annie Main involved in her life anymore--at least this is how she feels at the moment.  ROS: no fevers, no CP, no SOB, no wheezing, no cough, no dizziness, no HAs, no rashes, no melena/hematochezia.  No polyuria or polydipsia.  No myalgias or arthralgias.  No focal weakness, paresthesias, or tremors.  No acute vision or hearing abnormalities. No n/v/d or abd pain.  No palpitations.     Past Medical History:  Diagnosis Date  . Allergic rhinitis    with upper airway cough: as of 07/2017 pulm f/u she was instructed to use astelin, flonase, and saline more consistently  . BPPV (benign paroxysmal positional vertigo) 03/2018  . Chest pain, non-cardiac    Cardiac CT showed no signif obstructive dz, mildly elevated calcium level (Dr. [name]).  . Chronic cystitis with hematuria    [name]  . Chronic hypoxemic respiratory failure ([place]) 02/02/2015   2L oxygen 24/7  . Cold hands    NCS/EMGs normal.  Hand dysesthesias per neuro--reassured  . COPD (chronic obstructive pulmonary disease) ([place])    GOLD 4.  spirometry 01/10/09 FEV 0.97(52%), FEV1% 47.  With chronic bronchitis as of 07/2017.  . DDD (degenerative disc disease), cervical    1998 MRI C5-6 impingemt (left).  MRI 2021 (neurol) 1 level canal stenosis, 1 level foraminal stenosis--->PT  . DDD (degenerative disc disease), lumbar   . Depression with anxiety 04/15/2011  . Diarrheal disease Summer 2017   ? 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).  As of 10/05/15 pt reports GI dx'd her with C diff and rx'd more flagyl and  she is also on cholestyramine.  As of 02/2016, pt's sx's resolved completely.  . Diverticulosis of colon    Procto '97 and colonoscopy 2002  . Dysplastic nevus of upper extremity 04/2014   R tricep ([name])  . Edema of both lower extremities due to peripheral venous insufficiency    Varicosities bilat, swelling L>R.  R baker's cyst.  Got vein clinic eval 02/2018->sclerotherapy recommended but since no hemorrhage or ulceration, insurance will not cover the procedure.  . Family history of adverse reaction to anesthesia    mother died when patient age 18 receiving anesthesia for thyroid surgery  . Fibrocystic breast disease    w/fibroadenoma.  Bx's showed NO atypia (Dr. Margot Chimes).  Mammo neg 2008.  Marland Kitchen GERD (gastroesophageal reflux disease)   . History of double vision    Ophthalmologist, Dr. Herbert Deaner, is further evaluating this with MRI  orbits and limited brain (myesthenia gravis testing neg 07/2010)  . History of home oxygen therapy    uses 2 liters at hs  . Hypertension    EKG 03/2010 normal  . Hypothyroidism    Hashimoto's, dx'd 1989  . Idiopathic peripheral neuropathy 04/2018   Sensory (numb feet) S1 distribution bilat, c/w spinal stenosis sensory neuropathy (no pain). 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  Labs [place], MRI C spine->some spondylosis/stenosis.  UE NCS/EMS: normal 04/2019.  Marland Kitchen Lesion of right native kidney 2017   found on CT 02/2015.  F/u MRI 03/31/15 showed it to be smaller and less likely of any significance but repeat CT renal protocol in 72mowas recommended by radiology.  This was done 10/26/15 and showed no interval change in the lesion, but b/c the lesion enhances with contrast, radiol rec'd urol consult (b/c pap renal RCC could not be excluded).  Saw urol 03/06/16--stable on u/s 09/2016 and 09/2017.  . Osteoarthritis, hip, bilateral    she is s/p bilat THA as of 02/2018.  .Marland KitchenPneumonia   . Postmenopausal atrophic  vaginitis    improved with estrace 2 X/week.  . Prolapse of vaginal cuff after hysterectomy    10/2018: Dr. FWayne Both Mild colpocele 04/2019 per GYN (no surgery)  . Pulmonary nodule    CT chest 12/10, June 2011.  No nodule on CT 06/2010.  .Marland KitchenRecurrent Clostridium difficile diarrhea    Episode 09/19/2016: resolved with prolonged course of flagyl.  11/2016 recurrence treated with 10d of Dificid (Fidaxomicin) by GI (Dr. MEarlean Shawl.  . Recurrent UTI    likely related to pt's atrophic vaginitis.  Urol started vaginal estrace 03/2016.  . Rib fracture 09/2018   R 7th, laterally-->sustained when her dog pulled her over and she fell.  .Marland KitchenRLS (restless legs syndrome)    neuro->gabapentin trial 2020/21  . Shingles 02/2014   R abd/flank  . Spinal stenosis of lumbar region with neurogenic claudication    2008 MRI (L3-4, L4-5, L5-S1).  +S1  distribution sensory loss bilat    Past Surgical History:  Procedure Laterality Date  . ABDOMINAL HYSTERECTOMY  1978   no BSO per pt--nonmalignant reasons  . APPENDECTOMY    . BREAST CYST EXCISION     Last screening mammogram 10/2010 was normal.  . CHOLECYSTECTOMY  2001  . COLONOSCOPY  04/2005; 12/06/2015   2007 NORMAL.  2017 adenomatous polyp x 1.  Mod sigmoid diverticulosis (Dr. Earlean Shawl).    Marland Kitchen DEXA  05/18/2017   NORMAL (T-score 0.1)  . HIP CLOSED REDUCTION Right 12/25/2015   Procedure: CLOSED REDUCTION HIP;  Surgeon: Nicholes Stairs, MD;  Location: [facility];  Service: Orthopedics;  Laterality: Right;  . NCV/EMS  10/2018   chronic and symmetric sensorimotor axonal polyneuropathy affecting the lower extremities  . TONSILLECTOMY    . TOTAL HIP ARTHROPLASTY Right 11/01/2014   Procedure: RIGHT TOTAL HIP ARTHROPLASTY;  Surgeon: Latanya Maudlin, MD;  Location: [facility];  Service: Orthopedics;  Laterality: Right;  . TOTAL HIP ARTHROPLASTY Left 12/08/2017   Procedure: LEFT TOTAL HIP ARTHROPLASTY ANTERIOR APPROACH;  Surgeon: Paralee Cancel, MD;  Location: [facility];  Service:  Orthopedics;  Laterality: Left;  70 mins  . TRANSTHORACIC ECHOCARDIOGRAM  08/2014   EF 60-65%, grade I diast dysfxn  . TUBAL LIGATION  1974    Outpatient Medications Prior to Visit  Medication Sig Dispense Refill  . amLODipine (NORVASC) 5 MG tablet Take 1 tablet (5 mg total) by mouth daily. 90 tablet 0  . Artificial Tear Solution (SOOTHE XP) SOLN Place 1 drop into both eyes 2 (two) times daily as needed (irritation).    Marland Kitchen aspirin EC 81 MG tablet Take 81 mg by mouth daily.    . clobetasol (TEMOVATE) 0.05 % external solution APPLY TO THE AFFECTED AREA EVERY DAY    . diazepam (VALIUM) 5 MG tablet TAKE 1 TABLET BY MOUTH EVERY NIGHT AT BEDTIME AND MAY TAKE AN ADDITIONAL TABLET AS NEEDED FOR ANXIETY 60 tablet 5  . fluorometholone (FML) 0.1 % ophthalmic suspension Place 1 drop into both eyes as directed. Tapering off    . furosemide (LASIX) 20 MG tablet 1 tab po qd IF NEEDED for increased fluid collection in legs 30 tablet 3  . gabapentin (NEURONTIN) 100 MG capsule Take 2 capsules (200 mg total) by mouth at bedtime. 180 capsule 3  . montelukast (SINGULAIR) 10 MG tablet Take 1 tablet (10 mg total) by mouth at bedtime. 90 tablet 0  . Probiotic Product (PROBIOTIC DAILY PO) Take 1 capsule by mouth daily.     . roflumilast (DALIRESP) 500 MCG TABS tablet Take 1 tablet (500 mcg total) by mouth daily. 30 tablet 11  . triamcinolone cream (KENALOG) 0.1 % Apply 1 application topically 2 (two) times daily. 30 g 0  . venlafaxine (EFFEXOR) 50 MG tablet TAKE 1 TABLET BY MOUTH  TWICE DAILY 180 tablet 1  . VITAMIN D PO Take by mouth daily.    . Fluticasone-Umeclidin-Vilant (TRELEGY ELLIPTA) 100-62.5-25 MCG/INH AEPB Inhale 1 puff into the lungs daily. 60 each 4  . SYNTHROID 88 MCG tablet Take 1 tablet (88 mcg total) by mouth daily. 30 tablet 5  . albuterol (PROAIR HFA) 108 (90 Base) MCG/ACT inhaler Inhale 2 puffs into the lungs every 4 (four) hours as needed for wheezing or shortness of breath. (Patient not taking:  Reported on 08/01/2019) 8.5 g 1  . albuterol (PROVENTIL) (2.5 MG/3ML) 0.083% nebulizer solution INHALE 1 VIAL VIA NEBULIZER EVERY 6 HOURS AS NEEDED FOR WHEEZING OR SHORTNESS OF  BREATH (Patient not taking: Reported on 08/01/2019) 75 mL 1  . azelastine (ASTELIN) 0.1 % nasal spray Place 2 sprays into both nostrils 2 (two) times daily. Use in each nostril as directed (Patient not taking: Reported on 08/01/2019) 30 mL 3   No facility-administered medications prior to visit.    Allergies  Allergen Reactions  . Losartan Other (See Comments)    hyperkalemia  . Penicillins Hives, Swelling and Rash    Has patient had a PCN reaction causing immediate rash, facial/tongue/throat swelling, SOB or lightheadedness with hypotension: YES Has patient had a PCN reaction causing severe rash involving mucus membranes or skin necrosis: NO Has patient had a PCN reaction that required hospitalization NO Has patient had a PCN reaction occurring within the last 10 years: NO If all of the above answers are "NO", then may proceed with Cephalosporin use.   . Cefixime [Kdc:Cefixime] Other (See Comments)    unspecified  . Indocin [Indomethacin] Other (See Comments)    Painful tongue and throat  . Singulair [Montelukast Sodium]     Chest tightness   . Sulfa Antibiotics Other (See Comments)    unspecified  . Ace Inhibitors Other (See Comments)    cough  . Statins Other (See Comments)    Myalgias     ROS As per HPI  PE: Blood pressure (!) 144/78, pulse 67, temperature 98.1 F (36.7 C), temperature source Temporal, resp. rate 16, height 5' (1.524 m), weight 132 lb 6.4 oz (60.1 kg), SpO2 96 %. Gen: Alert, well appearing.  Patient is oriented to person, place, time, and situation. AFFECT: pleasant, lucid thought and speech. MMSE today 27/30 (1 off for date and 2 off for counting backwards from 100 by 7s).  LABS:  Lab Results  Component Value Date   TSH 2.09 05/11/2019   Lab Results  Component Value Date    WBC 8.9 05/26/2018   HGB 13.7 05/26/2018   HCT 42.0 05/26/2018   MCV 86.9 05/26/2018   PLT 269.0 05/26/2018   Lab Results  Component Value Date   CREATININE 0.76 05/11/2019   BUN 16 05/11/2019   NA 141 05/11/2019   K 4.0 05/11/2019   CL 106 05/11/2019   CO2 26 05/11/2019   Lab Results  Component Value Date   ALT 11 05/26/2018   AST 12 05/26/2018   ALKPHOS 91 05/26/2018   BILITOT 0.6 05/26/2018   Lab Results  Component Value Date   CHOL 211 (H) 05/26/2018   Lab Results  Component Value Date   HDL 68.20 05/26/2018   Lab Results  Component Value Date   LDLCALC 127 (H) 05/26/2018   Lab Results  Component Value Date   TRIG 79.0 05/26/2018   Lab Results  Component Value Date   CHOLHDL 3 05/26/2018   Lab Results  Component Value Date   HGBA1C 5.7 10/29/2011   Lab Results  Component Value Date   VITAMINB12 314 10/12/2018   Lab Results  Component Value Date   FERRITIN 68.9 12/06/2014    IMPRESSION AND PLAN:  Mild cognitive impairment with memory problems--->EXTREMELY MILD, and appropriate for age, chronic medical conditions, and medications used on regular basis.  NO SIGN OF DEMENTIA. No sign of signif worsening of her mild chronic anxiety d/o, no sign of depression. She is simply lonely, has developed an online friendship and made a poor choice to give him money.  She admits that she "half suspects" she's being scammed but at this point the friendship has meant enough  to her that she'd "be ok with it".   I reassured her that I don't think there is anything wrong with her, but strongly encouraged her to be careful with this online relationship--from a standpoint of both finances and safety. At this point she does NOT want me to share any information about her with her daughter or grandson.  I asked her to notify our administrators of these wishes so her chart can be up to date.  Spent 30 min with pt today, with >50% of this time spent in counseling and care  coordination regarding the above problems.  An After Visit Summary was printed and given to the patient.  FOLLOW UP: Return for prn.  Signed:  Crissie Sickles, MD           08/09/2019

## 2019-08-02 ENCOUNTER — Other Ambulatory Visit: Payer: Self-pay | Admitting: *Deleted

## 2019-08-02 ENCOUNTER — Telehealth: Payer: Self-pay | Admitting: Pulmonary Disease

## 2019-08-02 DIAGNOSIS — J9611 Chronic respiratory failure with hypoxia: Secondary | ICD-10-CM

## 2019-08-02 DIAGNOSIS — J449 Chronic obstructive pulmonary disease, unspecified: Secondary | ICD-10-CM

## 2019-08-02 MED ORDER — TRELEGY ELLIPTA 100-62.5-25 MCG/INH IN AEPB: 1.0000 | INHALATION_SPRAY | Freq: Every day | RESPIRATORY_TRACT | 4 refills | Status: DC

## 2019-08-02 NOTE — Telephone Encounter (Signed)
Called and spoke with pt to verify which pharmacy she wanted Rx to be sent to. Rx for Trelegy has been sent to pharmacy for pt. Also scheduled pt a f/u as she was due for an appt. Nothing further needed.

## 2019-08-02 NOTE — Patient Outreach (Signed)
[facility]) Care Management  08/02/2019  Priscilla Cantrell 1935-05-06 [id number]   RN Health Coach received  telephone call from patient.  [employer] compliance verified. Patient was calling to try to figure out how to reorder Trelegy. RN reminded patient what the pharmacy had explained to her.  Plan: Patient remembered and had numbers written sown Patient will call for refills.  [facility] [phone]

## 2019-08-03 ENCOUNTER — Other Ambulatory Visit: Payer: Self-pay

## 2019-08-03 MED ORDER — SYNTHROID 88 MCG PO TABS: 88.0000 ug | ORAL_TABLET | Freq: Every day | ORAL | 5 refills | Status: DC

## 2019-08-12 ENCOUNTER — Other Ambulatory Visit: Payer: Self-pay

## 2019-08-12 MED ORDER — DIAZEPAM 5 MG PO TABS: ORAL_TABLET | ORAL | 5 refills | Status: DC

## 2019-08-12 NOTE — Telephone Encounter (Signed)
RF request for Diazepam 5mg  tablets  LOV: 08/01/2019 and 05/11/2019 Next ov: Not scheduled  Last written: 01/25/2019 #60 x5 refills   [facility] [place]

## 2019-08-15 ENCOUNTER — Other Ambulatory Visit: Payer: Self-pay

## 2019-08-15 ENCOUNTER — Encounter: Payer: Self-pay | Admitting: Urology

## 2019-08-15 ENCOUNTER — Ambulatory Visit (INDEPENDENT_AMBULATORY_CARE_PROVIDER_SITE_OTHER): Payer: Medicare Other | Admitting: Urology

## 2019-08-15 VITALS — BP 122/69 | HR 65 | Temp 97.9°F | Ht 60.0 in | Wt 132.0 lb

## 2019-08-15 DIAGNOSIS — R3915 Urgency of urination: Secondary | ICD-10-CM | POA: Diagnosis not present

## 2019-08-15 DIAGNOSIS — N3021 Other chronic cystitis with hematuria: Secondary | ICD-10-CM | POA: Diagnosis not present

## 2019-08-15 MED ORDER — MIRABEGRON ER 25 MG PO TB24: 25.0000 mg | ORAL_TABLET | Freq: Every day | ORAL | 0 refills | Status: DC

## 2019-08-15 NOTE — Patient Instructions (Signed)
Overactive Bladder, Adult  Overactive bladder refers to a condition in which a person has a sudden need to pass urine. The person may leak urine if he or she cannot get to the bathroom fast enough (urinary incontinence). A person with this condition may also wake up several times in the night to go to the bathroom. Overactive bladder is associated with poor nerve signals between your bladder and your brain. Your bladder may get the signal to empty before it is full. You may also have very sensitive muscles that make your bladder squeeze too soon. These symptoms might interfere with daily work or social activities. What are the causes? This condition may be associated with or caused by:  Urinary tract infection.  Infection of nearby tissues, such as the prostate.  Prostate enlargement.  Surgery on the uterus or urethra.  Bladder stones, inflammation, or tumors.  Drinking too much caffeine or alcohol.  Certain medicines, especially medicines that get rid of extra fluid in the body (diuretics).  Muscle or nerve weakness, especially from: ? A spinal cord injury. ? Stroke. ? Multiple sclerosis. ? Parkinson's disease.  Diabetes.  Constipation. What increases the risk? You may be at greater risk for overactive bladder if you:  Are an older adult.  Smoke.  Are going through menopause.  Have prostate problems.  Have a neurological disease, such as stroke, dementia, Parkinson's disease, or multiple sclerosis (MS).  Eat or drink things that irritate the bladder. These include alcohol, spicy food, and caffeine.  Are overweight or obese. What are the signs or symptoms? Symptoms of this condition include:  Sudden, strong urge to urinate.  Leaking urine.  Urinating 8 or more times a day.  Waking up to urinate 2 or more times a night. How is this diagnosed? Your health care provider may suspect overactive bladder based on your symptoms. He or she will diagnose this condition  by:  A physical exam and medical history.  Blood or urine tests. You might need bladder or urine tests to help determine what is causing your overactive bladder. You might also need to see a health care provider who specializes in urinary tract problems (urologist). How is this treated? Treatment for overactive bladder depends on the cause of your condition and whether it is mild or severe. You can also make lifestyle changes at home. Options include:  Bladder training. This may include: ? Learning to control the urge to urinate by following a schedule that directs you to urinate at regular intervals (timed voiding). ? Doing Kegel exercises to strengthen your pelvic floor muscles, which support your bladder. Toning these muscles can help you control urination, even if your bladder muscles are overactive.  Special devices. This may include: ? Biofeedback, which uses sensors to help you become aware of your body's signals. ? Electrical stimulation, which uses electrodes placed inside the body (implanted) or outside the body. These electrodes send gentle pulses of electricity to strengthen the nerves or muscles that control the bladder. ? Women may use a plastic device that fits into the vagina and supports the bladder (pessary).  Medicines. ? Antibiotics to treat bladder infection. ? Antispasmodics to stop the bladder from releasing urine at the wrong time. ? Tricyclic antidepressants to relax bladder muscles. ? Injections of botulinum toxin type A directly into the bladder tissue to relax bladder muscles.  Lifestyle changes. This may include: ? Weight loss. Talk to your health care provider about weight loss methods that would work best for you. ?   Diet changes. This may include reducing how much alcohol and caffeine you consume, or drinking fluids at different times of the day. ? Not smoking. Do not use any products that contain nicotine or tobacco, such as cigarettes and e-cigarettes. If  you need help quitting, ask your health care provider.  Surgery. ? A device may be implanted to help manage the nerve signals that control urination. ? An electrode may be implanted to stimulate electrical signals in the bladder. ? A procedure may be done to change the shape of the bladder. This is done only in very severe cases. Follow these instructions at home: Lifestyle  Make any diet or lifestyle changes that are recommended by your health care provider. These may include: ? Drinking less fluid or drinking fluids at different times of the day. ? Cutting down on caffeine or alcohol. ? Doing Kegel exercises. ? Losing weight if needed. ? Eating a healthy and balanced diet to prevent constipation. This may include:  Eating foods that are high in fiber, such as fresh fruits and vegetables, whole grains, and beans.  Limiting foods that are high in fat and processed sugars, such as fried and sweet foods. General instructions  Take over-the-counter and prescription medicines only as told by your health care provider.  If you were prescribed an antibiotic medicine, take it as told by your health care provider. Do not stop taking the antibiotic even if you start to feel better.  Use any implants or pessary as told by your health care provider.  If needed, wear pads to absorb urine leakage.  Keep a journal or log to track how much and when you drink and when you feel the need to urinate. This will help your health care provider monitor your condition.  Keep all follow-up visits as told by your health care provider. This is important. Contact a health care provider if:  You have a fever.  Your symptoms do not get better with treatment.  Your pain and discomfort get worse.  You have more frequent urges to urinate. Get help right away if:  You are not able to control your bladder. Summary  Overactive bladder refers to a condition in which a person has a sudden need to pass  urine.  Several conditions may lead to an overactive bladder.  Treatment for overactive bladder depends on the cause and severity of your condition.  Follow your health care provider's instructions about lifestyle changes, doing Kegel exercises, keeping a journal, and taking medicines. This information is not intended to replace advice given to you by your health care provider. Make sure you discuss any questions you have with your health care provider. Document Revised: 05/13/2018 Document Reviewed: 02/05/2017 Elsevier Patient Education  2020 [employer]  

## 2019-08-15 NOTE — Progress Notes (Signed)
08/15/2019 1:50 PM   Priscilla Cantrell [birth year] [id number]  Referring provider: [name], MD [address] [place],  [place] [address]  Recurrent UTIs  HPI: Priscilla Cantrell is an 84yo here for followup for recurrent UTI and urinary urgency. No UTIs since last visit. She has stable urinary urgency. She has urinary incontinence when she wakes up in the morning. She drinks 12-20oz within 2 hours of going to bed.  She has urinary frequency every 2-3 hours. No dysuria or hematuria.  She has an intermittent clear discharge in her depend.    Her records from [facility] are as follows: I have chronic cystitis. HPI: Priscilla Cantrell is a 84 year-old female established patient who is here for chronic cystitis.  She does have a burning sensation when she urinates. She does have to strain or bear down to start her urinary stream. She is not having problems getting her urine stream started. She is not currently having trouble urinating. She is not having problems with emptying her bladder well.   She is not having problems with urinary control or incontinence. She is not urinating more frequently now than usual.   She does not have pelvic or rectal pain related to voiding.   03/06/2016: She does not wear pads. She denies urinary incontinence. She gets 4-5 UTIs per year. She was last treated with cipro 1 month ago for a UTI.   06/17/2016: No UTIs since last visit. No dysuria. No vaginal irritation. She uses estrace 2x per week   09/09/2016: No UTIs since last visit. No dysuria. no hematuria. She uses estrace 2x per week   09/10/2017: NO UTIs since last visit. She was treated for C diff which she had for 17 months and since treatment she has not had any UTIs.   07/13/2018: No UTIs since last visit.   CC: I have urinary urgency. HPI: She does have urgency. She does not have problems getting to the bathroom in time after she has the urge to urinate. The condition started approximately 09/03/2016. Her  symptoms have gotten worse over the last year.   She does wear protective pads. She wears 1-2 pads per day. She generally urinates every 4 hours in the daytime. She gets up at night to urinate 1 time. She is not having problems with emptying her bladder well.   09/10/2017: She has nocturia 1x and occasional nocturnal enuresis. She wears 1 pad and is not bothered by her incontinence   07/13/2018: She has stable urgency and nocturia since last visit. She wears 1 pad per day.      PMH: Past Medical History:  Diagnosis Date  . Allergic rhinitis    with upper airway cough: as of 07/2017 pulm f/u she was instructed to use astelin, flonase, and saline more consistently  . BPPV (benign paroxysmal positional vertigo) 03/2018  . Chest pain, non-cardiac    Cardiac CT showed no signif obstructive dz, mildly elevated calcium level (Dr. [name]).  . Chronic cystitis with hematuria    [name]  . Chronic hypoxemic respiratory failure ([place]) 02/02/2015   2L oxygen 24/7  . Cold hands    NCS/EMGs normal.  Hand dysesthesias per neuro--reassured  . COPD (chronic obstructive pulmonary disease) (HCC)    GOLD 4.  spirometry 01/10/09 FEV 0.97(52%), FEV1% 47.  With chronic bronchitis as of 07/2017.  . DDD (degenerative disc disease), cervical    1998 MRI C5-6 impingemt (left).  MRI 2021 (neurol) 1 level canal stenosis,  1 level foraminal stenosis--->PT  . DDD (degenerative disc disease), lumbar   . Depression with anxiety 04/15/2011  . Diarrheal disease Summer 2017   ? 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).  As of 10/05/15 pt reports GI dx'd her with C diff and rx'd more flagyl and she is also on cholestyramine.  As of 02/2016, pt's sx's resolved completely.  . Diverticulosis of colon    Procto '97 and colonoscopy 2002  . Dysplastic nevus of upper extremity 04/2014   R tricep ([name])  . Edema  of both lower extremities due to peripheral venous insufficiency    Varicosities bilat, swelling L>R.  R baker's cyst.  Got vein clinic eval 02/2018->sclerotherapy recommended but since no hemorrhage or ulceration, insurance will not cover the procedure.  . Family history of adverse reaction to anesthesia    mother died when patient age 28 receiving anesthesia for thyroid surgery  . Fibrocystic breast disease    w/fibroadenoma.  Bx's showed NO atypia (Dr. Margot Chimes).  Mammo neg 2008.  Marland Kitchen GERD (gastroesophageal reflux disease)   . History of double vision    Ophthalmologist, Dr. Herbert Deaner, is further evaluating this with MRI  orbits and limited brain (myesthenia gravis testing neg 07/2010)  . History of home oxygen therapy    uses 2 liters at hs  . Hypertension    EKG 03/2010 normal  . Hypothyroidism    Hashimoto's, dx'd 1989  . Idiopathic peripheral neuropathy 04/2018   Sensory (numb feet) S1 distribution bilat, c/w spinal stenosis sensory neuropathy (no pain). 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  Labs [place], MRI C spine->some spondylosis/stenosis.  UE NCS/EMS: normal 04/2019.  Marland Kitchen Lesion of right native kidney 2017   found on CT 02/2015.  F/u MRI 03/31/15 showed it to be smaller and less likely of any significance but repeat CT renal protocol in 73mo was recommended by radiology.  This was done 10/26/15 and showed no interval change in the lesion, but b/c the lesion enhances with contrast, radiol rec'd urol consult (b/c pap renal RCC could not be excluded).  Saw urol 03/06/16--stable on u/s 09/2016 and 09/2017.  . Osteoarthritis, hip, bilateral    she is s/p bilat THA as of 02/2018.  Marland Kitchen Pneumonia   . Postmenopausal atrophic vaginitis    improved with estrace 2 X/week.  . Prolapse of vaginal cuff after hysterectomy    10/2018: Dr. Wayne Both. Mild colpocele 04/2019 per GYN (no surgery)  . Pulmonary nodule    CT chest 12/10, June 2011.  No nodule on  CT 06/2010.  Marland Kitchen Recurrent Clostridium difficile diarrhea    Episode 09/19/2016: resolved with prolonged course of flagyl.  11/2016 recurrence treated with 10d of Dificid (Fidaxomicin) by GI (Dr. Earlean Shawl).  . Recurrent UTI    likely related to pt's atrophic vaginitis.  Urol started vaginal estrace 03/2016.  . Rib fracture 09/2018   R 7th, laterally-->sustained when her dog pulled her over and she fell.  Marland Kitchen RLS (restless legs syndrome)    neuro->gabapentin trial 2020/21  . Shingles 02/2014   R abd/flank  . Spinal stenosis of lumbar region with neurogenic claudication    2008 MRI (L3-4, L4-5, L5-S1).  +S1 distribution sensory loss bilat    Surgical History: Past Surgical History:  Procedure Laterality Date  . ABDOMINAL HYSTERECTOMY  1978   no BSO per pt--nonmalignant reasons  . APPENDECTOMY    .  BREAST CYST EXCISION     Last screening mammogram 10/2010 was normal.  . CHOLECYSTECTOMY  2001  . COLONOSCOPY  04/2005; 12/06/2015   2007 NORMAL.  2017 adenomatous polyp x 1.  Mod sigmoid diverticulosis (Dr. Earlean Shawl).    Marland Kitchen DEXA  05/18/2017   NORMAL (T-score 0.1)  . HIP CLOSED REDUCTION Right 12/25/2015   Procedure: CLOSED REDUCTION HIP;  Surgeon: Nicholes Stairs, MD;  Location: [facility];  Service: Orthopedics;  Laterality: Right;  . NCV/EMS  10/2018   chronic and symmetric sensorimotor axonal polyneuropathy affecting the lower extremities  . TONSILLECTOMY    . TOTAL HIP ARTHROPLASTY Right 11/01/2014   Procedure: RIGHT TOTAL HIP ARTHROPLASTY;  Surgeon: Latanya Maudlin, MD;  Location: [facility];  Service: Orthopedics;  Laterality: Right;  . TOTAL HIP ARTHROPLASTY Left 12/08/2017   Procedure: LEFT TOTAL HIP ARTHROPLASTY ANTERIOR APPROACH;  Surgeon: Paralee Cancel, MD;  Location: [facility];  Service: Orthopedics;  Laterality: Left;  70 mins  . TRANSTHORACIC ECHOCARDIOGRAM  08/2014   EF 60-65%, grade I diast dysfxn  . TUBAL LIGATION  1974    Home Medications:  Allergies as of 08/15/2019      Reactions    Losartan Other (See Comments)   hyperkalemia   Penicillins Hives, Swelling, Rash   Has patient had a PCN reaction causing immediate rash, facial/tongue/throat swelling, SOB or lightheadedness with hypotension: YES Has patient had a PCN reaction causing severe rash involving mucus membranes or skin necrosis: NO Has patient had a PCN reaction that required hospitalization NO Has patient had a PCN reaction occurring within the last 10 years: NO If all of the above answers are "NO", then may proceed with Cephalosporin use.   Cefixime [kdc:cefixime] Other (See Comments)   unspecified   Indocin [indomethacin] Other (See Comments)   Painful tongue and throat   Singulair [montelukast Sodium]    Chest tightness    Sulfa Antibiotics Other (See Comments)   unspecified   Ace Inhibitors Other (See Comments)   cough   Statins Other (See Comments)   Myalgias      Medication List       Accurate as of August 15, 2019  1:50 PM. If you have any questions, ask your nurse or doctor.        albuterol 108 (90 Base) MCG/ACT inhaler Commonly known as: ProAir HFA Inhale 2 puffs into the lungs every 4 (four) hours as needed for wheezing or shortness of breath.   albuterol (2.5 MG/3ML) 0.083% nebulizer solution Commonly known as: PROVENTIL INHALE 1 VIAL VIA NEBULIZER EVERY 6 HOURS AS NEEDED FOR WHEEZING OR SHORTNESS OF BREATH   amLODipine 5 MG tablet Commonly known as: NORVASC Take 1 tablet (5 mg total) by mouth daily.   aspirin EC 81 MG tablet Take 81 mg by mouth daily.   azelastine 0.1 % nasal spray Commonly known as: ASTELIN Place 2 sprays into both nostrils 2 (two) times daily. Use in each nostril as directed   clobetasol 0.05 % external solution Commonly known as: TEMOVATE APPLY TO THE AFFECTED AREA EVERY DAY   diazepam 5 MG tablet Commonly known as: VALIUM TAKE 1 TABLET BY MOUTH EVERY NIGHT AT BEDTIME AND MAY TAKE AN ADDITIONAL TABLET AS NEEDED FOR ANXIETY   fluorometholone 0.1 %  ophthalmic suspension Commonly known as: FML Place 1 drop into both eyes as directed. Tapering off   furosemide 20 MG tablet Commonly known as: LASIX 1 tab po qd IF NEEDED for increased fluid collection in legs  gabapentin 100 MG capsule Commonly known as: Neurontin Take 2 capsules (200 mg total) by mouth at bedtime.   montelukast 10 MG tablet Commonly known as: SINGULAIR Take 1 tablet (10 mg total) by mouth at bedtime.   Probiotic 250 MG Caps Take by mouth.   PROBIOTIC DAILY PO Take 1 capsule by mouth daily.   roflumilast 500 MCG Tabs tablet Commonly known as: DALIRESP Take 1 tablet (500 mcg total) by mouth daily.   Soothe XP Soln Place 1 drop into both eyes 2 (two) times daily as needed (irritation).   Synthroid 88 MCG tablet Generic drug: levothyroxine Take 1 tablet (88 mcg total) by mouth daily.   Trelegy Ellipta 100-62.5-25 MCG/INH Aepb Generic drug: Fluticasone-Umeclidin-Vilant Inhale 1 puff into the lungs daily.   triamcinolone cream 0.1 % Commonly known as: KENALOG Apply 1 application topically 2 (two) times daily.   venlafaxine 50 MG tablet Commonly known as: EFFEXOR TAKE 1 TABLET BY MOUTH  TWICE DAILY   vitamin B-12 100 MCG tablet Commonly known as: CYANOCOBALAMIN Take 100 mcg by mouth daily.   VITAMIN D PO Take by mouth daily.       Allergies:  Allergies  Allergen Reactions  . Losartan Other (See Comments)    hyperkalemia  . Penicillins Hives, Swelling and Rash    Has patient had a PCN reaction causing immediate rash, facial/tongue/throat swelling, SOB or lightheadedness with hypotension: YES Has patient had a PCN reaction causing severe rash involving mucus membranes or skin necrosis: NO Has patient had a PCN reaction that required hospitalization NO Has patient had a PCN reaction occurring within the last 10 years: NO If all of the above answers are "NO", then may proceed with Cephalosporin use.   . Cefixime [Kdc:Cefixime] Other (See  Comments)    unspecified  . Indocin [Indomethacin] Other (See Comments)    Painful tongue and throat  . Singulair [Montelukast Sodium]     Chest tightness   . Sulfa Antibiotics Other (See Comments)    unspecified  . Ace Inhibitors Other (See Comments)    cough  . Statins Other (See Comments)    Myalgias     Family History: Family History  Problem Relation Age of Onset  . Goiter Mother   . Psoriasis Father        od liver  . Cirrhosis Father   . Diabetes Daughter        Type 2  . Stroke Maternal Grandfather   . Stroke Paternal Grandmother   . Hypertension Paternal Grandmother   . Hernia Paternal Grandmother   . Cancer Paternal Grandfather        lung  . Cancer Maternal Aunt        cancer of the stomach    Social History:  reports that she quit smoking about 29 years ago. Her smoking use included cigarettes. She started smoking about 63 years ago. She has a 51.00 pack-year smoking history. She has never used smokeless tobacco. She reports current alcohol use. She reports that she does not use drugs.  ROS: All other review of systems were reviewed and are negative except what is noted above in HPI  Physical Exam: BP 122/69   Pulse 65   Temp 97.9 F (36.6 C)   Ht 5' (1.524 m)   Wt 132 lb (59.9 kg)   BMI 25.78 kg/m   Constitutional:  Alert and oriented, No acute distress. HEENT: [place] AT, moist mucus membranes.  Trachea midline, no masses. Cardiovascular: No clubbing, cyanosis,  or edema. Respiratory: Normal respiratory effort, no increased work of breathing. GI: Abdomen is soft, nontender, nondistended, no abdominal masses GU: No CVA tenderness.  Lymph: No cervical or inguinal lymphadenopathy. Skin: No rashes, bruises or suspicious lesions. Neurologic: Grossly intact, no focal deficits, moving all 4 extremities. Psychiatric: Normal mood and affect.  Laboratory Data: Lab Results  Component Value Date   WBC 8.9 05/26/2018   HGB 13.7 05/26/2018   HCT 42.0  05/26/2018   MCV 86.9 05/26/2018   PLT 269.0 05/26/2018    Lab Results  Component Value Date   CREATININE 0.76 05/11/2019    No results found for: PSA  No results found for: TESTOSTERONE  Lab Results  Component Value Date   HGBA1C 5.7 10/29/2011    Urinalysis    Component Value Date/Time   COLORURINE YELLOW 06/14/2015 1452   APPEARANCEUR CLOUDY (A) 06/14/2015 1452   LABSPEC 1.020 06/14/2015 1452   PHURINE 6.5 06/14/2015 1452   GLUCOSEU NEGATIVE 06/14/2015 1452   HGBUR LARGE (A) 06/14/2015 1452   BILIRUBINUR negative 09/01/2018 1147   KETONESUR NEGATIVE 06/14/2015 1452   PROTEINUR Negative 09/01/2018 1147   PROTEINUR 100 (A) 02/20/2015 1020   UROBILINOGEN 0.2 09/01/2018 1147   UROBILINOGEN 0.2 06/14/2015 1452   NITRITE negative 09/01/2018 1147   NITRITE NEGATIVE 06/14/2015 1452   LEUKOCYTESUR Moderate (2+) (A) 09/01/2018 1147    Lab Results  Component Value Date   BACTERIA Rare(<10/hpf) (A) 06/14/2015    Pertinent Imaging:  Results for orders placed during the hospital encounter of 06/06/15  DG Abd 1 View  Narrative CLINICAL DATA:  Anal leakage that she states is not diarhhea x 2 weeks. Pt denies pain or nausea or vomiting. She states she had shingles a couple of months ago but finished up her antibiotics x 1 month ago.  EXAM: ABDOMEN - 1 VIEW  COMPARISON:  None.  FINDINGS: There is no bowel dilatation to suggest obstruction. There is no evidence of pneumoperitoneum, portal venous gas or pneumatosis. There are no pathologic calcifications along the expected course of the ureters. There is a right total hip arthroplasty. There is moderate osteoarthritis of the left hip. There is lumbar spine spondylosis.  IMPRESSION: Unremarkable KUB.   Electronically Signed By: Kathreen Devoid On: 06/06/2015 16:07  Results for orders placed during the hospital encounter of 09/02/17  US Venous Img Lower Bilateral  Narrative CLINICAL DATA:  Bilateral lower  extremity pain and edema. History of chronic mild venous insufficiency. History of varicose veins. Evaluate for DVT.  EXAM: BILATERAL LOWER EXTREMITY VENOUS DOPPLER ULTRASOUND  TECHNIQUE: Gray-scale sonography with graded compression, as well as color Doppler and duplex ultrasound were performed to evaluate the lower extremity deep venous systems from the level of the common femoral vein and including the common femoral, femoral, profunda femoral, popliteal and calf veins including the posterior tibial, peroneal and gastrocnemius veins when visible. The superficial great saphenous vein was also interrogated. Spectral Doppler was utilized to evaluate flow at rest and with distal augmentation maneuvers in the common femoral, femoral and popliteal veins.  COMPARISON:  None.  FINDINGS: RIGHT LOWER EXTREMITY  Common Femoral Vein: No evidence of thrombus. Normal compressibility, respiratory phasicity and response to augmentation.  Saphenofemoral Junction: No evidence of thrombus. Normal compressibility and flow on color Doppler imaging.  Profunda Femoral Vein: No evidence of thrombus. Normal compressibility and flow on color Doppler imaging.  Femoral Vein: No evidence of thrombus. Normal compressibility, respiratory phasicity and response to augmentation.  Popliteal Vein: No evidence  of thrombus. Normal compressibility, respiratory phasicity and response to augmentation.  Calf Veins: No evidence of thrombus. Normal compressibility and flow on color Doppler imaging.  Superficial Great Saphenous Vein: No evidence of thrombus. Normal compressibility.  Venous Reflux:  None.  Other Findings: There is an approximately 3.8 x 1.6 x 3.3 cm anechoic serpiginous fluid collection with the right popliteal fossa compatible with a Baker cyst.  LEFT LOWER EXTREMITY  Common Femoral Vein: No evidence of thrombus. Normal compressibility, respiratory phasicity and response to augmentation.   Saphenofemoral Junction: No evidence of thrombus. Normal compressibility and flow on color Doppler imaging.  Profunda Femoral Vein: No evidence of thrombus. Normal compressibility and flow on color Doppler imaging.  Femoral Vein: No evidence of thrombus. Normal compressibility, respiratory phasicity and response to augmentation.  Popliteal Vein: No evidence of thrombus. Normal compressibility, respiratory phasicity and response to augmentation.  Calf Veins: No evidence of thrombus. Normal compressibility and flow on color Doppler imaging.  Superficial Great Saphenous Vein: No evidence of thrombus. Normal compressibility.  Venous Reflux:  None.  Other Findings:  None.  IMPRESSION: 1. No evidence of DVT within either lower extremity. 2. Incidentally noted 3.8 cm right-sided Baker's cyst.   Electronically Signed By: Sandi Mariscal M.D. On: 09/02/2017 15:46  No results found for this or any previous visit.  No results found for this or any previous visit.  No results found for this or any previous visit.  No results found for this or any previous visit.  No results found for this or any previous visit.  No results found for this or any previous visit.   Assessment & Plan:    1. Chronic cystitis with hematuria -continue timed voiding  2. Urinary urgency -We will trial mirabegron 25mg  daily   No follow-ups on file.  Nicolette Bang, MD  [facility] [place]

## 2019-08-15 NOTE — Progress Notes (Signed)

## 2019-08-16 ENCOUNTER — Other Ambulatory Visit: Payer: Self-pay | Admitting: Family Medicine

## 2019-08-16 ENCOUNTER — Telehealth: Payer: Self-pay | Admitting: Family Medicine

## 2019-08-16 MED ORDER — DIAZEPAM 5 MG PO TABS: ORAL_TABLET | ORAL | 5 refills | Status: DC

## 2019-08-16 NOTE — Telephone Encounter (Signed)
Tell her that b/c of all the laws we have to follow, she does need to file a police report stating the pills MAY have been stolen. Obviously I trust her and have no suspicion of misuse or diversion, so am going to send in a new rx with new instructions so she does not have withdrawal from this medication. -thx

## 2019-08-16 NOTE — Progress Notes (Signed)
New diazepam rx with new instructions today---this will be her new way of taking this med as of this time. Signed:  Crissie Sickles, MD           08/16/2019

## 2019-08-16 NOTE — Telephone Encounter (Signed)
LM for pt to returncall

## 2019-08-16 NOTE — Telephone Encounter (Signed)
Patient was last seen on 08/01/19 and last refill done 08/12/19(60,5) to [facility]. Unsure what to do regarding refill request.

## 2019-08-16 NOTE — Telephone Encounter (Signed)
Patient returned call and notified new Rx available at the pharmacy. Will call back if any other issues

## 2019-08-16 NOTE — Telephone Encounter (Signed)
Patient calling to report she did pick up her Valium prescription from the pharmacy last week and has since lost or misplaced it. Called the pharmacy to see what her options were and they told her that unless the dose is increased, they would not be able to give her any additional medication. Advised patient that there is a likelihood she will need to file a police report that it was lost or stolen. Patient is asking what she can do to obtain more medication as she has been out since last Thursday.

## 2019-08-18 DIAGNOSIS — H532 Diplopia: Secondary | ICD-10-CM | POA: Diagnosis not present

## 2019-08-22 ENCOUNTER — Encounter: Payer: Self-pay | Admitting: Pulmonary Disease

## 2019-08-22 ENCOUNTER — Other Ambulatory Visit: Payer: Self-pay

## 2019-08-22 ENCOUNTER — Ambulatory Visit (INDEPENDENT_AMBULATORY_CARE_PROVIDER_SITE_OTHER): Payer: Medicare Other | Admitting: Pulmonary Disease

## 2019-08-22 ENCOUNTER — Other Ambulatory Visit: Payer: Self-pay | Admitting: Family Medicine

## 2019-08-22 VITALS — BP 138/76 | HR 67 | Temp 97.9°F | Ht 60.0 in | Wt 133.4 lb

## 2019-08-22 DIAGNOSIS — J309 Allergic rhinitis, unspecified: Secondary | ICD-10-CM

## 2019-08-22 DIAGNOSIS — J449 Chronic obstructive pulmonary disease, unspecified: Secondary | ICD-10-CM | POA: Diagnosis not present

## 2019-08-22 DIAGNOSIS — J9611 Chronic respiratory failure with hypoxia: Secondary | ICD-10-CM | POA: Diagnosis not present

## 2019-08-22 NOTE — Patient Instructions (Addendum)
You were seen today by Lauraine Rinne, NP  for:   1. COPD GOLD III B  Trelegy Ellipta  >>> 1 puff daily in the morning >>>rinse mouth out after use  >>> This inhaler contains 3 medications that help manage her respiratory status, contact our office if you cannot afford this medication or unable to remain on this medication  Only use your albuterol as a rescue medication to be used if you can't catch your breath by resting or doing a relaxed purse lip breathing pattern.  - The less you use it, the better it will work when you need it. - Ok to use up to 2 puffs  every 4 hours if you must but call for immediate appointment if use goes up over your usual need - Don't leave home without it !!  (think of it like the spare tire for your car)   Continue albuterol nebulized meds as prescribed  Note your daily symptoms > remember "red flags" for COPD:   >>>Increase in cough >>>increase in sputum production >>>increase in shortness of breath or activity  intolerance.   If you notice these symptoms, please call the office to be seen.     2. Chronic hypoxemic respiratory failure (HCC)   Continue oxygen therapy as prescribed  >>>maintain oxygen saturations greater than 88 percent  >>>if unable to maintain oxygen saturations please contact the office  >>>do not smoke with oxygen  >>>can use nasal saline gel or nasal saline rinses to moisturize nose if oxygen causes dryness  Recommend that you receive the seasonal flu vaccine in fall/2022   Follow Up:    Return in about 6 months (around 02/22/2020), or if symptoms worsen or fail to improve, for Follow up with [name].   Please do your part to reduce the spread of COVID-19:      Reduce your risk of any infection  and COVID19 by using the similar precautions used for avoiding the common cold or flu:  Marland Kitchen Wash your hands often with soap and warm water for at least 20 seconds.  If soap and water are not readily available, use an alcohol-based  hand sanitizer with at least 60% alcohol.  . If coughing or sneezing, cover your mouth and nose by coughing or sneezing into the elbow areas of your shirt or coat, into a tissue or into your sleeve (not your hands). Langley Gauss A MASK when in public  . Avoid shaking hands with others and consider head nods or verbal greetings only. . Avoid touching your eyes, nose, or mouth with unwashed hands.  . Avoid close contact with people who are sick. . Avoid places or events with large numbers of people in one location, like concerts or sporting events. . If you have some symptoms but not all symptoms, continue to monitor at home and seek medical attention if your symptoms worsen. . If you are having a medical emergency, call 911.   [place] / e-Visit: [email or web address]         [facility]: [facility]: [phone]                   [facility]: [phone]     It is flu season:   >>> Best ways to protect herself from the flu: Receive the yearly flu vaccine, practice good hand hygiene washing with soap and also using hand sanitizer when available, eat  a nutritious meals, get adequate rest, hydrate appropriately   Please contact the office if your symptoms worsen or you have concerns that you are not improving.   Thank you for choosing [facility] for your healthcare, and for allowing [place] to partner with you on your healthcare journey. I am thankful to be able to provide care to you today.   Wyn Quaker FNP-C

## 2019-08-22 NOTE — Assessment & Plan Note (Signed)
Plan: Continue Trelegy Ellipta Continue Daliresp Continue Singulair Continue daily antihistamine Continue rescue inhaler and/or nebulized meds as needed for shortness of breath and wheezing

## 2019-08-22 NOTE — Assessment & Plan Note (Signed)
Plan: Continue Singulair Continue daily antihistamine Continue Flonase and ipratropium nasal spray

## 2019-08-22 NOTE — Progress Notes (Signed)
@Patient  ID: Priscilla Cantrell, female    DOB: Mar 16, 1935, 84 y.o.   MRN: [id number]  Chief Complaint  Patient presents with  . Follow-up    COPD, follow-up    Referring provider: [name], MD  HPI:  84 year old female former smoker followed in our office for GOLD III COPD (based on: Spirometry 05/14/17 >> FEV1 0.91 (55%), FEV1% 48), nocturnal hypoxemia  PMH: GERD, hip pain, osteoarthritis Smoker/ Smoking History: Former smoker.  Quit in 1992.  70 pack years. Maintenance: Trelegy Ellipta, Daliresp  [name]  08/22/2019  - Visit   84 year old female former smoker followed in our office for COPD and chronic respiratory failure.  She is followed by Dr. Earney Mallet.  She was last seen in our office back in September/2020.  At that time it was recommended that she remain on Trelegy Ellipta.  She received her Pneumovax booster shot at that visit.  She was encouraged to remain on 2 L of O2 at night as well as as needed throughout the day with household activities.  She was recommended to remain on Astelin nasal spray as well as Flonase for management of allergic rhinitis.  It was recommended that she follow-up in about 6 months.  She is presenting today for follow-up.  Patient reporting she is doing quite well since last being seen.  She reports no acute exacerbations any antibiotics or prednisone.  She remains adherent to Trelegy Ellipta as well as Daliresp.  She is working with [facility] to help with making these medications more affordable.  She has no acute respiratory issues today.  Questionaires / Pulmonary Flowsheets:   ACT:  No flowsheet data found.  MMRC: No flowsheet data found.  Epworth:  No flowsheet data found.  Tests:   Pulmonary tests:  Spirometry 01/10/09>>FEV1 0.97(52%), FEV1% 47 March 2012>>Daliresp started RAST 05/04/17 >> negative, IgE 8 Spirometry 05/14/17 >> FEV1 0.91 (55%), FEV1% 48  Sleep tests:  PSG 11/14/11 >> AHI 0.3 ONO with RA  07/31/16 >>test time 6 hrs 41 min. Average SpO2 87%, low SpO2 74%. Spent 4 hrs 13 min with SpO2 <88%.  Cardiac tests:  Echo 08/30/14 >> EF 60 to 58%, grade 1 diastolic dysfx, PAS 24 mmHG  FENO:  No results found for: NITRICOXIDE  PFT: PFT Results Latest Ref Rng & Units 10/29/2018  FVC-Pre L 2.01  FVC-Predicted Pre % 96  FVC-Post L 2.03  FVC-Predicted Post % 97  Pre FEV1/FVC % % 52  Post FEV1/FCV % % 52  FEV1-Pre L 1.04  FEV1-Predicted Pre % 67  FEV1-Post L 1.05  DLCO UNC% % 44  DLCO COR %Predicted % 51  TLC L 4.29  TLC % Predicted % 96  RV % Predicted % 76    WALK:  SIX MIN WALK 07/29/2018 07/29/2016 02/08/2015 11/17/2014  Supplimental Oxygen during Test? (L/min) - No No No  Tech Comments: Pt walked at a good pace. Pt dropped at 2nd lap and more on 3rd lap. O2 appilied after 3rd lap, walked 1 lap at 2 liters, maintained O2 sat above 92. pt walked at a slow pace - no desat - no c/o SOB, wheezing or chest tx--amg At end of 1st lap, o2 sat 88% on RA with pulse of 96.  pt placed on 2lpm continuous o2 with resting o2 sat of 98% and pulse of 76.  After ambulating 1 lap on 2lpm cont o2, o2 sat 91% with pulse of 97.  Lowest o2 sat during this lap  was 91% on 2lpm.  Walk stopped after 1 lap on o2 per pt's request d/t weakness from recent hip surgery.   pt unable to complete test d/t recent hip replacement    Imaging: No results found.  Lab Results:  CBC    Component Value Date/Time   WBC 8.9 05/26/2018 0952   RBC 4.83 05/26/2018 0952   HGB 13.7 05/26/2018 0952   HCT 42.0 05/26/2018 0952   PLT 269.0 05/26/2018 0952   MCV 86.9 05/26/2018 0952   MCH 28.4 12/10/2017 0559   MCHC 32.7 05/26/2018 0952   RDW 16.0 (H) 05/26/2018 0952   LYMPHSABS 2.0 09/02/2017 1057   MONOABS 0.4 09/02/2017 1057   EOSABS 0.1 09/02/2017 1057   BASOSABS 0.0 09/02/2017 1057    BMET    Component Value Date/Time   NA 141 05/11/2019 1331   K 4.0 05/11/2019 1331   CL 106 05/11/2019 1331   CO2 26  05/11/2019 1331   GLUCOSE 84 05/11/2019 1331   BUN 16 05/11/2019 1331   CREATININE 0.76 05/11/2019 1331   CREATININE 0.84 02/27/2016 1533   CALCIUM 9.1 05/11/2019 1331   GFRNONAA >60 12/10/2017 0559   GFRAA >60 12/10/2017 0559    BNP    Component Value Date/Time   BNP 78.4 01/24/2015 1217    ProBNP No results found for: PROBNP  Specialty Problems      Pulmonary Problems   Allergic rhinitis    Qualifier: Diagnosis of  By: [name] MD, [name]        COPD GOLD III B    Spirometry 01/10/09>>FEV1 0.97(52%), FEV1% 47 March 2012>>Daliresp started RAST 05/04/17 >> negative, IgE 8 Spirometry 05/14/17 >> FEV1 0.91 (55%), FEV1% 48       Hypoxia   SOB (shortness of breath)   Chronic hypoxemic respiratory failure (HCC)    ONO with RA 07/31/16 >> test time 6 hrs 41 min.  Average SpO2 87%, low SpO2 74%.  Spent 4 hrs 13 min with SpO2 < 88%.         Allergies  Allergen Reactions  . Losartan Other (See Comments)    hyperkalemia  . Penicillins Hives, Swelling and Rash    Has patient had a PCN reaction causing immediate rash, facial/tongue/throat swelling, SOB or lightheadedness with hypotension: YES Has patient had a PCN reaction causing severe rash involving mucus membranes or skin necrosis: NO Has patient had a PCN reaction that required hospitalization NO Has patient had a PCN reaction occurring within the last 10 years: NO If all of the above answers are "NO", then may proceed with Cephalosporin use.   . Cefixime [Kdc:Cefixime] Other (See Comments)    unspecified  . Indocin [Indomethacin] Other (See Comments)    Painful tongue and throat  . Singulair [Montelukast Sodium]     Chest tightness   . Sulfa Antibiotics Other (See Comments)    unspecified  . Ace Inhibitors Other (See Comments)    cough  . Statins Other (See Comments)    Myalgias     Immunization History  Administered Date(s) Administered  . Fluad Quad(high Dose 65+) 10/05/2018  . Influenza Split 11/04/2010,  10/23/2011  . Influenza Whole 10/04/2009  . Influenza, High Dose Seasonal PF 11/07/2016, 11/04/2017  . Influenza,inj,Quad PF,6+ Mos 10/07/2012, 10/11/2014  . Influenza-Unspecified 11/07/2013, 11/01/2015  . Moderna SARS-COVID-2 Vaccination 02/14/2019, 03/15/2019  . Pneumococcal Conjugate-13 04/29/2013  . Pneumococcal Polysaccharide-23 02/03/2005, 11/28/2010, 11/01/2018  . Td 02/04/2008, 03/07/2010  . Zoster 02/10/2013  . Zoster Recombinat (Shingrix) 10/30/2016, 12/29/2016  Past Medical History:  Diagnosis Date  . Allergic rhinitis    with upper airway cough: as of 07/2017 pulm f/u she was instructed to use astelin, flonase, and saline more consistently  . BPPV (benign paroxysmal positional vertigo) 03/2018  . Chest pain, non-cardiac    Cardiac CT showed no signif obstructive dz, mildly elevated calcium level (Dr. [name]).  . Chronic cystitis with hematuria    [name]  . Chronic hypoxemic respiratory failure ([place]) 02/02/2015   2L oxygen 24/7  . Cold hands    NCS/EMGs normal.  Hand dysesthesias per neuro--reassured  . COPD (chronic obstructive pulmonary disease) (HCC)    GOLD 4.  spirometry 01/10/09 FEV 0.97(52%), FEV1% 47.  With chronic bronchitis as of 07/2017.  . DDD (degenerative disc disease), cervical    1998 MRI C5-6 impingemt (left).  MRI 2021 (neurol) 1 level canal stenosis, 1 level foraminal stenosis--->PT  . DDD (degenerative disc disease), lumbar   . Depression with anxiety 04/15/2011  . Diarrheal disease Summer 2017   ? 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).  As of 10/05/15 pt reports GI dx'd her with C diff and rx'd more flagyl and she is also on cholestyramine.  As of 02/2016, pt's sx's resolved completely.  . Diverticulosis of colon    Procto '97 and colonoscopy 2002  . Dysplastic nevus of upper extremity 04/2014   R tricep ([name])  . Edema of  both lower extremities due to peripheral venous insufficiency    Varicosities bilat, swelling L>R.  R baker's cyst.  Got vein clinic eval 02/2018->sclerotherapy recommended but since no hemorrhage or ulceration, insurance will not cover the procedure.  . Family history of adverse reaction to anesthesia    mother died when patient age 60 receiving anesthesia for thyroid surgery  . Fibrocystic breast disease    w/fibroadenoma.  Bx's showed NO atypia (Dr. Margot Chimes).  Mammo neg 2008.  Marland Kitchen GERD (gastroesophageal reflux disease)   . History of double vision    Ophthalmologist, Dr. Herbert Deaner, is further evaluating this with MRI  orbits and limited brain (myesthenia gravis testing neg 07/2010)  . History of home oxygen therapy    uses 2 liters at hs  . Hypertension    EKG 03/2010 normal  . Hypothyroidism    Hashimoto's, dx'd 1989  . Idiopathic peripheral neuropathy 04/2018   Sensory (numb feet) S1 distribution bilat, c/w spinal stenosis sensory neuropathy (no pain). 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  Labs [place], MRI C spine->some spondylosis/stenosis.  UE NCS/EMS: normal 04/2019.  Marland Kitchen Lesion of right native kidney 2017   found on CT 02/2015.  F/u MRI 03/31/15 showed it to be smaller and less likely of any significance but repeat CT renal protocol in 35mo was recommended by radiology.  This was done 10/26/15 and showed no interval change in the lesion, but b/c the lesion enhances with contrast, radiol rec'd urol consult (b/c pap renal RCC could not be excluded).  Saw urol 03/06/16--stable on u/s 09/2016 and 09/2017.  . Osteoarthritis, hip, bilateral    she is s/p bilat THA as of 02/2018.  Marland Kitchen Pneumonia   . Postmenopausal atrophic vaginitis    improved with estrace 2 X/week.  . Prolapse of vaginal cuff after hysterectomy    10/2018: Dr. Wayne Both. Mild colpocele 04/2019 per GYN (no surgery)  . Pulmonary nodule    CT chest 12/10, June 2011.  No nodule on CT  06/2010.  Marland Kitchen Recurrent Clostridium difficile diarrhea    Episode 09/19/2016: resolved with prolonged course of flagyl.  11/2016 recurrence treated with 10d of Dificid (Fidaxomicin) by GI (Dr. Earlean Shawl).  . Recurrent UTI    likely related to pt's atrophic vaginitis.  Urol started vaginal estrace 03/2016.  . Rib fracture 09/2018   R 7th, laterally-->sustained when her dog pulled her over and she fell.  Marland Kitchen RLS (restless legs syndrome)    neuro->gabapentin trial 2020/21  . Shingles 02/2014   R abd/flank  . Spinal stenosis of lumbar region with neurogenic claudication    2008 MRI (L3-4, L4-5, L5-S1).  +S1 distribution sensory loss bilat    Tobacco History: Social History   Tobacco Use  Smoking Status Former Smoker  . Packs/day: 1.50  . Years: 34.00  . Pack years: 51.00  . Types: Cigarettes  . Start date: 23  . Quit date: 02/03/1990  . Years since quitting: 29.5  Smokeless Tobacco Never Used   Counseling given: Not Answered   Continue to not smoke  Outpatient Encounter Medications as of 08/22/2019  Medication Sig  . albuterol (PROAIR HFA) 108 (90 Base) MCG/ACT inhaler Inhale 2 puffs into the lungs every 4 (four) hours as needed for wheezing or shortness of breath.  Marland Kitchen albuterol (PROVENTIL) (2.5 MG/3ML) 0.083% nebulizer solution INHALE 1 VIAL VIA NEBULIZER EVERY 6 HOURS AS NEEDED FOR WHEEZING OR SHORTNESS OF BREATH  . amLODipine (NORVASC) 5 MG tablet Take 1 tablet (5 mg total) by mouth daily.  . Artificial Tear Solution (SOOTHE XP) SOLN Place 1 drop into both eyes 2 (two) times daily as needed (irritation).  Marland Kitchen aspirin EC 81 MG tablet Take 81 mg by mouth daily.  Marland Kitchen azelastine (ASTELIN) 0.1 % nasal spray Place 2 sprays into both nostrils 2 (two) times daily. Use in each nostril as directed  . clobetasol (TEMOVATE) 0.05 % external solution APPLY TO THE AFFECTED AREA EVERY DAY  . diazepam (VALIUM) 5 MG tablet TAKE 1 TABLET BY MOUTH EVERY NIGHT AT BEDTIME AND MAY TAKE AN ADDITIONAL TABLET AS  NEEDED FOR ANXIETY  . fluorometholone (FML) 0.1 % ophthalmic suspension Place 1 drop into both eyes as directed. Tapering off  . Fluticasone-Umeclidin-Vilant (TRELEGY ELLIPTA) 100-62.5-25 MCG/INH AEPB Inhale 1 puff into the lungs daily.  . furosemide (LASIX) 20 MG tablet 1 tab po qd IF NEEDED for increased fluid collection in legs  . gabapentin (NEURONTIN) 100 MG capsule Take 2 capsules (200 mg total) by mouth at bedtime.  . mirabegron ER (MYRBETRIQ) 25 MG TB24 tablet Take 1 tablet (25 mg total) by mouth daily.  . montelukast (SINGULAIR) 10 MG tablet Take 1 tablet (10 mg total) by mouth at bedtime.  . Probiotic Product (PROBIOTIC DAILY PO) Take 1 capsule by mouth daily.   . roflumilast (DALIRESP) 500 MCG TABS tablet Take 1 tablet (500 mcg total) by mouth daily.  . Saccharomyces boulardii (PROBIOTIC) 250 MG CAPS Take by mouth.  . SYNTHROID 88 MCG tablet Take 1 tablet (88 mcg total) by mouth daily.  Marland Kitchen triamcinolone cream (KENALOG) 0.1 % Apply 1 application topically 2 (two) times daily.  Marland Kitchen venlafaxine (EFFEXOR) 50 MG tablet TAKE 1 TABLET BY MOUTH  TWICE DAILY  . vitamin B-12 (CYANOCOBALAMIN) 100 MCG tablet Take 100 mcg by mouth daily.  Marland Kitchen VITAMIN D PO Take by mouth daily.   No facility-administered encounter medications on file as of 08/22/2019.     Review of Systems  Review of Systems  Constitutional: Positive for fatigue. Negative for activity change and fever.  HENT: Negative for sinus pressure, sinus pain and sore throat.   Respiratory: Positive for shortness of breath. Negative for cough and wheezing.   Cardiovascular: Negative for chest pain and palpitations.  Gastrointestinal: Negative for diarrhea, nausea and vomiting.  Musculoskeletal: Negative for arthralgias.  Neurological: Negative for dizziness.  Psychiatric/Behavioral: Negative for sleep disturbance. The patient is not nervous/anxious.      Physical Exam  BP 138/76 (BP Location: Left Arm, Cuff Size: Normal)   Pulse 67    Temp 97.9 F (36.6 C) (Oral)   Ht 5' (1.524 m)   Wt 133 lb 6.4 oz (60.5 kg)   SpO2 96%   BMI 26.05 kg/m   Wt Readings from Last 5 Encounters:  08/22/19 133 lb 6.4 oz (60.5 kg)  08/15/19 132 lb (59.9 kg)  08/01/19 132 lb 6.4 oz (60.1 kg)  05/11/19 132 lb (59.9 kg)  02/23/19 132 lb 3.2 oz (60 kg)    BMI Readings from Last 5 Encounters:  08/22/19 26.05 kg/m  08/15/19 25.78 kg/m  08/01/19 25.86 kg/m  05/11/19 25.78 kg/m  02/23/19 25.82 kg/m     Physical Exam Vitals and nursing note reviewed.  Constitutional:      General: She is not in acute distress.    Appearance: Normal appearance. She is normal weight.  HENT:     Head: Normocephalic and atraumatic.     Right Ear: External ear normal.     Left Ear: External ear normal.     Nose: Nose normal. No congestion or rhinorrhea.     Mouth/Throat:     Mouth: Mucous membranes are moist.     Pharynx: Oropharynx is clear.  Eyes:     Pupils: Pupils are equal, round, and reactive to light.  Cardiovascular:     Rate and Rhythm: Normal rate and regular rhythm.     Pulses: Normal pulses.     Heart sounds: Normal heart sounds. No murmur heard.   Pulmonary:     Effort: Pulmonary effort is normal. No respiratory distress.     Breath sounds: Normal breath sounds. No decreased air movement. No decreased breath sounds, wheezing or rales.  Musculoskeletal:     Cervical back: Normal range of motion.  Skin:    General: Skin is warm and dry.     Capillary Refill: Capillary refill takes less than 2 seconds.  Neurological:     General: No focal deficit present.     Mental Status: She is alert and oriented to person, place, and time. Mental status is at baseline.     Gait: Gait normal.  Psychiatric:        Mood and Affect: Mood normal.        Behavior: Behavior normal.        Thought Content: Thought content normal.        Judgment: Judgment normal.       Assessment & Plan:   COPD GOLD III B Plan: Continue Trelegy  Ellipta Continue Daliresp Continue Singulair Continue daily antihistamine Continue rescue inhaler and/or nebulized meds as needed for shortness of breath and wheezing   Chronic hypoxemic respiratory failure (HCC) Plan: Continue oxygen therapy as prescribed  Allergic rhinitis Plan: Continue Singulair Continue daily antihistamine Continue Flonase and ipratropium nasal spray    Return in about 6 months (around 02/22/2020), or if symptoms worsen or fail to improve, for Follow up with [name].   Lauraine Rinne, NP 08/22/2019  This appointment required 24 minutes of patient care (this includes precharting, chart review, review of results, face-to-face care, etc.).

## 2019-08-22 NOTE — Assessment & Plan Note (Signed)
Plan: Continue oxygen therapy as prescribed 

## 2019-08-22 NOTE — Progress Notes (Signed)
Reviewed and agree with assessment/plan.   Chesley Mires, MD [facility] Pulmonary/Critical Care 08/22/2019, 1:29 PM Pager:  [phone]

## 2019-08-29 ENCOUNTER — Telehealth: Payer: Self-pay

## 2019-08-29 NOTE — Telephone Encounter (Signed)
Requested single fiber emg has been requested from [facility].

## 2019-08-31 ENCOUNTER — Encounter: Payer: Self-pay | Admitting: Family Medicine

## 2019-08-31 ENCOUNTER — Other Ambulatory Visit: Payer: Self-pay | Admitting: *Deleted

## 2019-08-31 DIAGNOSIS — Z1231 Encounter for screening mammogram for malignant neoplasm of breast: Secondary | ICD-10-CM | POA: Diagnosis not present

## 2019-08-31 DIAGNOSIS — M810 Age-related osteoporosis without current pathological fracture: Secondary | ICD-10-CM | POA: Diagnosis not present

## 2019-08-31 LAB — HM MAMMOGRAPHY

## 2019-08-31 NOTE — Patient Outreach (Signed)
[place] [facility]) Care Management  08/31/2019  Priscilla Cantrell 10/16/1935 [id number]   RN Health Coach attempted follow up outreach call to patient.  Patient was unavailable. HIPPA compliance voicemail message left with return callback number.  Plan: RN will call patient again within 30 days.  [place] Care Management [phone]

## 2019-08-31 NOTE — Patient Outreach (Signed)
[place] [facility]) Care Management  [place]  08/31/2019   Priscilla Cantrell [birth year] [id number]  RN Health Coach telephone call to patient.  Hipaa compliance verified. Per patient she is using her control inhalers as ordered. Patient stated she rarely has to use her rescue inhaler. Per patient she stated that she uses the oxygen when doing chores. Patient is able to drive and make Dr appointments. Patient has agreed to follow up outreach calls.   Encounter Medications:  Outpatient Encounter Medications as of 08/31/2019  Medication Sig  . albuterol (PROAIR HFA) 108 (90 Base) MCG/ACT inhaler Inhale 2 puffs into the lungs every 4 (four) hours as needed for wheezing or shortness of breath.  Marland Kitchen albuterol (PROVENTIL) (2.5 MG/3ML) 0.083% nebulizer solution INHALE 1 VIAL VIA NEBULIZER EVERY 6 HOURS AS NEEDED FOR WHEEZING OR SHORTNESS OF BREATH  . amLODipine (NORVASC) 5 MG tablet Take 1 tablet (5 mg total) by mouth daily.  . Artificial Tear Solution (SOOTHE XP) SOLN Place 1 drop into both eyes 2 (two) times daily as needed (irritation).  Marland Kitchen aspirin EC 81 MG tablet Take 81 mg by mouth daily.  Marland Kitchen azelastine (ASTELIN) 0.1 % nasal spray Place 2 sprays into both nostrils 2 (two) times daily. Use in each nostril as directed  . clobetasol (TEMOVATE) 0.05 % external solution APPLY TO THE AFFECTED AREA EVERY DAY  . diazepam (VALIUM) 5 MG tablet TAKE 1 TABLET BY MOUTH EVERY NIGHT AT BEDTIME AND MAY TAKE AN ADDITIONAL TABLET AS NEEDED FOR ANXIETY  . fluorometholone (FML) 0.1 % ophthalmic suspension Place 1 drop into both eyes as directed. Tapering off  . Fluticasone-Umeclidin-Vilant (TRELEGY ELLIPTA) 100-62.5-25 MCG/INH AEPB Inhale 1 puff into the lungs daily.  . furosemide (LASIX) 20 MG tablet 1 tab po qd IF NEEDED for increased fluid collection in legs  . gabapentin (NEURONTIN) 100 MG capsule Take 2 capsules (200 mg total) by mouth at bedtime.  . mirabegron ER (MYRBETRIQ) 25 MG TB24 tablet  Take 1 tablet (25 mg total) by mouth daily.  . montelukast (SINGULAIR) 10 MG tablet TAKE 1 TABLET BY MOUTH AT  BEDTIME  . Probiotic Product (PROBIOTIC DAILY PO) Take 1 capsule by mouth daily.   . roflumilast (DALIRESP) 500 MCG TABS tablet Take 1 tablet (500 mcg total) by mouth daily.  . Saccharomyces boulardii (PROBIOTIC) 250 MG CAPS Take by mouth.  . SYNTHROID 88 MCG tablet Take 1 tablet (88 mcg total) by mouth daily.  Marland Kitchen triamcinolone cream (KENALOG) 0.1 % Apply 1 application topically 2 (two) times daily.  Marland Kitchen venlafaxine (EFFEXOR) 50 MG tablet TAKE 1 TABLET BY MOUTH  TWICE DAILY  . vitamin B-12 (CYANOCOBALAMIN) 100 MCG tablet Take 100 mcg by mouth daily.  Marland Kitchen VITAMIN D PO Take by mouth daily.   No facility-administered encounter medications on file as of 08/31/2019.    Functional Status:  In your present state of health, do you have any difficulty performing the following activities: 04/05/2019  Hearing? N  Vision? N  Walking or climbing stairs? N  Dressing or bathing? N  Doing errands, shopping? N  Preparing Food and eating ? N  Using the Toilet? N  In the past six months, have you accidently leaked urine? Y  Comment at night when sleeping  Do you have problems with loss of bowel control? N  Managing your Medications? N  Managing your Finances? Y  Comment for medications  Housekeeping or managing your Housekeeping? N  Some recent data might be hidden  Fall/Depression Screening: Fall Risk  08/31/2019 04/05/2019 02/10/2019  Falls in the past year? 1 1 1   Number falls in past yr: 1 1 0  Injury with Fall? 1 1 0  Comment - - -  Risk for fall due to : History of fall(s) History of fall(s) -  Risk for fall due to: Comment - - -  Follow up Falls evaluation completed Falls evaluation completed;Falls prevention discussed -  Comment - - -   PHQ 2/9 Scores 04/05/2019 01/19/2019 11/03/2018 11/03/2018 08/20/2018 08/02/2018 08/02/2018  PHQ - 2 Score 1 0 0 0 0 0 0  PHQ- 9 Score - - - - - - -    Goals Addressed            This Visit's Progress   . Client will report no worsening of symptoms of COPD within the next 3 months       CARE PLAN ENTRY (see longtitudinal plan of care for additional care plan information)  Current Barriers:  Marland Kitchen Knowledge deficits related to basic understanding of COPD disease process . Knowledge deficits related to basic COPD self care/management . Knowledge deficit related to importance of energy conservation   Case Manager Clinical Goal(s):  Over the next 90 days, patient will be able to verbalize understanding of COPD action plan and when to seek appropriate levels of medical care  Over the next 90 days, patient will verbalize basic understanding of COPD disease process and self care activities  Over the next 90 days, patient will not be hospitalized for COPD exacerbation   Interventions:   Provided patient with basic written and verbal COPD education on self care/management/and exacerbation prevention   Provided patient with COPD action plan and reinforced importance of daily self assessment  Provided patient with education about the role of exercise in the management of COPD  Advised patient to engage in light exercise as tolerated 3-5 days a week  Patient Self Care Activities:  . Takes medications as prescribed including inhalers . Self assesses COPD action plan zone and makes appointment with provider if in the yellow zone for 48 hours without improvement. . Engages in light exercise 3-5 days a week . Utilizes infection prevention strategies to reduce risk of respiratory infection  . Does not currently perform self care activities related to COPD management  RN will follow up with outreach within the month of August       Assessment:  Patient is using control inhalers as per ordered Patient is using rescue inhalers very seldom Patient is using oxygen prn  Plan:  Patient will continue to take inhalers as per  ordered Patient verbalizes action plan for COPD Patient understands oxygen safety RN will follow up outreach call within the month of August  Chyr[place] Management [phone]

## 2019-09-01 ENCOUNTER — Telehealth: Payer: Self-pay

## 2019-09-01 ENCOUNTER — Ambulatory Visit (INDEPENDENT_AMBULATORY_CARE_PROVIDER_SITE_OTHER): Payer: Medicare Other | Admitting: Family Medicine

## 2019-09-01 ENCOUNTER — Encounter: Payer: Self-pay | Admitting: Family Medicine

## 2019-09-01 ENCOUNTER — Other Ambulatory Visit: Payer: Self-pay

## 2019-09-01 ENCOUNTER — Ambulatory Visit: Payer: Medicare Other | Admitting: Family Medicine

## 2019-09-01 VITALS — BP 133/71 | HR 61 | Temp 98.1°F | Resp 16 | Ht 60.0 in | Wt 132.6 lb

## 2019-09-01 DIAGNOSIS — H9191 Unspecified hearing loss, right ear: Secondary | ICD-10-CM

## 2019-09-01 DIAGNOSIS — H6121 Impacted cerumen, right ear: Secondary | ICD-10-CM | POA: Diagnosis not present

## 2019-09-01 DIAGNOSIS — T50905A Adverse effect of unspecified drugs, medicaments and biological substances, initial encounter: Secondary | ICD-10-CM | POA: Diagnosis not present

## 2019-09-01 NOTE — Progress Notes (Signed)
This visit occurred during the SARS-CoV-2 public health emergency.  Safety protocols were in place, including screening questions prior to the visit, additional usage of staff PPE, and extensive cleaning of exam room while observing appropriate contact time as indicated for disinfecting solutions.    Priscilla Cantrell , Sep 05, 1935, 84 y.o., female MRN: [id number] Patient Care Team    Relationship Specialty Notifications Start End  [name], MD PCP - General   01/16/10    Comment: Candida Peeling, Denice Bors, MD Consulting Physician Cardiology  06/06/10   Monna Fam, MD Consulting Physician Ophthalmology  01/29/11   Chesley Mires, MD Consulting Physician Pulmonary Disease  12/21/12   Latanya Maudlin, MD Consulting Physician Orthopedic Surgery  09/26/14   McKenzie, [name], MD Consulting Physician Urology  03/10/16   Richmond Campbell, MD Consulting Physician Gastroenterology  10/28/16   Paralee Cancel, MD Consulting Physician Orthopedic Surgery  09/04/17   Linus Mako, MD Consulting Physician Family Medicine  03/03/18   Lauraine Rinne, NP Nurse Practitioner Pulmonary Disease  08/02/18   Alda Berthold, DO Consulting Physician Neurology  09/06/18   Phineas Real Belinda Block, MD (Inactive) Consulting Physician Gynecology  11/06/18   Pleasant, Eppie Gibson, RN [facility]  Admissions 12/03/18   Arlyss Gandy, PA-C  Dermatology  06/23/19     Chief Complaint  Patient presents with  . Dizziness    started after taking singulair 10mg  but stopped taking med 8-10 days ago     Subjective: Pt presents for an OV with complaints of right ear fullness and muffled hearing  of few weeks duration.  Associated symptoms include electric shock like pain that she feels in her ear about 2 times a day that last only a second.   Pt also reports she stopped her Singulair about 10 days ago secondary to she felt her decreased mental clarity was coming from the Singulair.  She  reports since stopping the Singulair though symptoms have resolved.  Patient also states she had a "muscle biopsy" completed on her forehead last week by a neurology team.  Per EMR review a single-fiber EMG was completed 08/18/2019 for work-up concerning myasthenia gravis.  Depression screen [facility] 2/9 04/05/2019 01/19/2019 11/03/2018 11/03/2018 08/20/2018  Decreased Interest 0 0 0 0 0  Down, Depressed, Hopeless 1 0 0 0 0  PHQ - 2 Score 1 0 0 0 0  Altered sleeping - - - - -  Tired, decreased energy - - 0 - -  Change in appetite - - 0 - -  Feeling bad or failure about yourself  - - 0 - -  Trouble concentrating - - - - -  Moving slowly or fidgety/restless - - 0 - -  Suicidal thoughts - - - - -  PHQ-9 Score - - - - -  Difficult doing work/chores - - Not difficult at all - -  Some recent data might be hidden    Allergies  Allergen Reactions  . Losartan Other (See Comments)    hyperkalemia  . Penicillins Hives, Swelling and Rash    Has patient had a PCN reaction causing immediate rash, facial/tongue/throat swelling, SOB or lightheadedness with hypotension: YES Has patient had a PCN reaction causing severe rash involving mucus membranes or skin necrosis: NO Has patient had a PCN reaction that required hospitalization NO Has patient had a PCN reaction occurring within the last 10 years: NO If all of the above answers are "NO", then may proceed  with Cephalosporin use.   . Cefixime [Kdc:Cefixime] Other (See Comments)    unspecified  . Indocin [Indomethacin] Other (See Comments)    Painful tongue and throat  . Singulair [Montelukast Sodium]     Chest tightness   . Sulfa Antibiotics Other (See Comments)    unspecified  . Ace Inhibitors Other (See Comments)    cough  . Statins Other (See Comments)    Myalgias    Social History   Social History Narrative   Widowed since fall 2014   Lives in [place], [place].      Worked as Research officer, trade union, retired 2017.   She has support of her  daughter and grandson. She is independent with all care needs and transportation at this time. 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    She reports she is on SSI and receives another separate check    She reports her work hx included working in Engineer, mining for [employer] and volunteered in a [employer]    She reports having a short hair dog for 2 years  but denies noting any allergic reactions   Right handed   Two story home   Past Medical History:  Diagnosis Date  . Allergic rhinitis    with upper airway cough: as of 07/2017 pulm f/u she was instructed to use astelin, flonase, and saline more consistently  . BPPV (benign paroxysmal positional vertigo) 03/2018  . Chest pain, non-cardiac    Cardiac CT showed no signif obstructive dz, mildly elevated calcium level (Dr. [name]).  . Chronic cystitis with hematuria    [name]  . Chronic hypoxemic respiratory failure ([place]) 02/02/2015   2L oxygen 24/7  . Cold hands    NCS/EMGs normal.  Hand dysesthesias per neuro--reassured  . COPD (chronic obstructive pulmonary disease) (HCC)    GOLD 4.  spirometry 01/10/09 FEV 0.97(52%), FEV1% 47.  With chronic bronchitis as of 07/2017.  . DDD (degenerative disc disease), cervical    1998 MRI C5-6 impingemt (left).  MRI 2021 (neurol) 1 level canal stenosis, 1 level foraminal stenosis--->PT  . DDD (degenerative disc disease), lumbar   . Depression with anxiety 04/15/2011  . Diarrheal disease Summer 2017   ? 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).  As of 10/05/15 pt reports GI dx'd her with C diff and rx'd more flagyl and she is also on cholestyramine.  As of 02/2016, pt's sx's resolved completely.  . Diverticulosis of colon    Procto '97 and colonoscopy 2002  . Dysplastic nevus of upper extremity 04/2014    R tricep ([name])  . Edema of both lower extremities due to peripheral venous insufficiency    Varicosities bilat, swelling L>R.  R baker's cyst.  Got vein clinic eval 02/2018->sclerotherapy recommended but since no hemorrhage or ulceration, insurance will not cover the procedure.  . Family history of adverse reaction to anesthesia    mother died when patient age 41 receiving anesthesia for thyroid surgery  . Fibrocystic breast disease    w/fibroadenoma.  Bx's showed NO atypia (Dr. Margot Chimes).  Mammo neg 2008.  Marland Kitchen GERD (gastroesophageal reflux disease)   . History of double vision    Ophthalmologist, Dr. Herbert Deaner, is further evaluating this with MRI  orbits and limited brain (myesthenia gravis testing neg 07/2010)  . History of home oxygen  therapy    uses 2 liters at hs  . Hypertension    EKG 03/2010 normal  . Hypothyroidism    Hashimoto's, dx'd 1989  . Idiopathic peripheral neuropathy 04/2018   Sensory (numb feet) S1 distribution bilat, c/w spinal stenosis sensory neuropathy (no pain). 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  Labs Leal, MRI C spine->some spondylosis/stenosis.  UE NCS/EMS: normal 04/2019.  Marland Kitchen Lesion of right native kidney 2017   found on CT 02/2015.  F/u MRI 03/31/15 showed it to be smaller and less likely of any significance but repeat CT renal protocol in 58mo was recommended by radiology.  This was done 10/26/15 and showed no interval change in the lesion, but b/c the lesion enhances with contrast, radiol rec'd urol consult (b/c pap renal RCC could not be excluded).  Saw urol 03/06/16--stable on u/s 09/2016 and 09/2017.  . Osteoarthritis, hip, bilateral    she is s/p bilat THA as of 02/2018.  Marland Kitchen Pneumonia   . Postmenopausal atrophic vaginitis    improved with estrace 2 X/week.  . Prolapse of vaginal cuff after hysterectomy    10/2018: Dr. Wayne Both. Mild colpocele 04/2019 per GYN (no surgery)  . Pulmonary nodule    CT  chest 12/10, June 2011.  No nodule on CT 06/2010.  Marland Kitchen Recurrent Clostridium difficile diarrhea    Episode 09/19/2016: resolved with prolonged course of flagyl.  11/2016 recurrence treated with 10d of Dificid (Fidaxomicin) by GI (Dr. Earlean Shawl).  . Recurrent UTI    likely related to pt's atrophic vaginitis.  Urol started vaginal estrace 03/2016.  . Rib fracture 09/2018   R 7th, laterally-->sustained when her dog pulled her over and she fell.  Marland Kitchen RLS (restless legs syndrome)    neuro->gabapentin trial 2020/21  . Shingles 02/2014   R abd/flank  . Spinal stenosis of lumbar region with neurogenic claudication    2008 MRI (L3-4, L4-5, L5-S1).  +S1 distribution sensory loss bilat   Past Surgical History:  Procedure Laterality Date  . ABDOMINAL HYSTERECTOMY  1978   no BSO per pt--nonmalignant reasons  . APPENDECTOMY    . BREAST CYST EXCISION     Last screening mammogram 10/2010 was normal.  . CHOLECYSTECTOMY  2001  . COLONOSCOPY  04/2005; 12/06/2015   2007 NORMAL.  2017 adenomatous polyp x 1.  Mod sigmoid diverticulosis (Dr. Earlean Shawl).    Marland Kitchen DEXA  05/18/2017   NORMAL (T-score 0.1)  . HIP CLOSED REDUCTION Right 12/25/2015   Procedure: CLOSED REDUCTION HIP;  Surgeon: Nicholes Stairs, MD;  Location: [facility];  Service: Orthopedics;  Laterality: Right;  . NCV/EMS  10/2018   chronic and symmetric sensorimotor axonal polyneuropathy affecting the lower extremities  . TONSILLECTOMY    . TOTAL HIP ARTHROPLASTY Right 11/01/2014   Procedure: RIGHT TOTAL HIP ARTHROPLASTY;  Surgeon: Latanya Maudlin, MD;  Location: [facility];  Service: Orthopedics;  Laterality: Right;  . TOTAL HIP ARTHROPLASTY Left 12/08/2017   Procedure: LEFT TOTAL HIP ARTHROPLASTY ANTERIOR APPROACH;  Surgeon: Paralee Cancel, MD;  Location: [facility];  Service: Orthopedics;  Laterality: Left;  70 mins  . TRANSTHORACIC ECHOCARDIOGRAM  08/2014   EF 60-65%, grade I diast dysfxn  . TUBAL LIGATION  1974   Family History  Problem Relation Age of Onset  .  Goiter Mother   . Psoriasis Father        od liver  . Cirrhosis Father   . Diabetes Daughter        Type 2  .  Stroke Maternal Grandfather   . Stroke Paternal Grandmother   . Hypertension Paternal Grandmother   . Hernia Paternal Grandmother   . Cancer Paternal Grandfather        lung  . Cancer Maternal Aunt        cancer of the stomach   Allergies as of 09/01/2019       Reactions   Losartan Other (See Comments)   hyperkalemia   Penicillins Hives, Swelling, Rash   Has patient had a PCN reaction causing immediate rash, facial/tongue/throat swelling, SOB or lightheadedness with hypotension: YES Has patient had a PCN reaction causing severe rash involving mucus membranes or skin necrosis: NO Has patient had a PCN reaction that required hospitalization NO Has patient had a PCN reaction occurring within the last 10 years: NO If all of the above answers are "NO", then may proceed with Cephalosporin use.   Cefixime [kdc:cefixime] Other (See Comments)   unspecified   Indocin [indomethacin] Other (See Comments)   Painful tongue and throat   Singulair [montelukast Sodium]    Chest tightness    Sulfa Antibiotics Other (See Comments)   unspecified   Ace Inhibitors Other (See Comments)   cough   Statins Other (See Comments)   Myalgias        Medication List        Accurate as of September 01, 2019 11:40 AM. If you have any questions, ask your nurse or doctor.          STOP taking these medications    Probiotic 250 MG Caps Stopped by: Howard Pouch, DO       TAKE these medications    albuterol 108 (90 Base) MCG/ACT inhaler Commonly known as: ProAir HFA Inhale 2 puffs into the lungs every 4 (four) hours as needed for wheezing or shortness of breath.   albuterol (2.5 MG/3ML) 0.083% nebulizer solution Commonly known as: PROVENTIL INHALE 1 VIAL VIA NEBULIZER EVERY 6 HOURS AS NEEDED FOR WHEEZING OR SHORTNESS OF BREATH   amLODipine 5 MG tablet Commonly known as:  NORVASC Take 1 tablet (5 mg total) by mouth daily.   aspirin EC 81 MG tablet Take 81 mg by mouth daily.   azelastine 0.1 % nasal spray Commonly known as: ASTELIN Place 2 sprays into both nostrils 2 (two) times daily. Use in each nostril as directed   clobetasol 0.05 % external solution Commonly known as: TEMOVATE APPLY TO THE AFFECTED AREA EVERY DAY   diazepam 5 MG tablet Commonly known as: VALIUM TAKE 1 TABLET BY MOUTH EVERY NIGHT AT BEDTIME AND MAY TAKE AN ADDITIONAL TABLET AS NEEDED FOR ANXIETY   fluorometholone 0.1 % ophthalmic suspension Commonly known as: FML Place 1 drop into both eyes as directed. Tapering off   furosemide 20 MG tablet Commonly known as: LASIX 1 tab po qd IF NEEDED for increased fluid collection in legs   gabapentin 100 MG capsule Commonly known as: Neurontin Take 2 capsules (200 mg total) by mouth at bedtime.   mirabegron ER 25 MG Tb24 tablet Commonly known as: MYRBETRIQ Take 1 tablet (25 mg total) by mouth daily.   montelukast 10 MG tablet Commonly known as: SINGULAIR TAKE 1 TABLET BY MOUTH AT  BEDTIME   PROBIOTIC DAILY PO Take 1 capsule by mouth daily.   roflumilast 500 MCG Tabs tablet Commonly known as: DALIRESP Take 1 tablet (500 mcg total) by mouth daily.   Soothe XP Soln Place 1 drop into both eyes 2 (two) times daily as needed (irritation).  Synthroid 88 MCG tablet Generic drug: levothyroxine Take 1 tablet (88 mcg total) by mouth daily.   Trelegy Ellipta 100-62.5-25 MCG/INH Aepb Generic drug: Fluticasone-Umeclidin-Vilant Inhale 1 puff into the lungs daily.   triamcinolone cream 0.1 % Commonly known as: KENALOG Apply 1 application topically 2 (two) times daily.   venlafaxine 50 MG tablet Commonly known as: EFFEXOR TAKE 1 TABLET BY MOUTH  TWICE DAILY   vitamin B-12 100 MCG tablet Commonly known as: CYANOCOBALAMIN Take 100 mcg by mouth daily.   VITAMIN D PO Take by mouth daily.        All past medical history,  surgical history, allergies, family history, immunizations andmedications were updated in the EMR today and reviewed under the history and medication portions of their EMR.     ROS: Negative, with the exception of above mentioned in HPI   Objective:  BP (!) 133/71 (BP Location: Right Arm, Patient Position: Sitting, Cuff Size: Normal)   Pulse 61   Temp 98.1 F (36.7 C) (Oral)   Resp 16   Ht 5' (1.524 m)   Wt 132 lb 9.6 oz (60.1 kg)   SpO2 96%   BMI 25.90 kg/m  Body mass index is 25.9 kg/m. Gen: Afebrile. No acute distress. Nontoxic in appearance, well developed, well nourished.  HENT: AT. [place]. Bilateral TM visualized LEFT TM WNL. Right TM unable to be visualized d/t cerumen.  Eyes:Pupils Equal Round Reactive to light, Extraocular movements intact,  Conjunctiva without redness, discharge or icterus. Neck/lymp/endocrine: Supple,no lymphadenopathy Neuro: Normal gait. PERLA. EOMi. Alert. Oriented x3   No exam data present No results found. No results found for this or any previous visit (from the past 24 hour(s)).  Assessment/Plan: DANETT PALAZZO is a 84 y.o. female present for OV for  Cerumen debris on tympanic membrane of right ear/Decreased hearing of right ear Procedure: Cerumen disimpaction Water-peroxide solution was applied and gentle ear lavage performed on right ear(s).  There were no complications.  Tympanic membrane was visualized after disimpaction.  Tympanic membrane(s) intact.  Auditory canal(s) without erythema or swelling.  Patient tolerated procedure well.  Patient reported relief of symptoms after removal of cerumen.  Adverse effect of drug, initial encounter Pt reports she was having dizziness and decreased mental clarity for a few months which she felt may have come from her Singulair, which has been prescribed since 2019.  She stopped med a little over a week ago and feels her symptoms resolved.  Med removed from pt active list and she was encouraged to call  [facility] to ensure they cancel script. If allergies worsen off med> follow with PCP .   Reviewed expectations re: course of current medical issues.  Discussed self-management of symptoms.  Outlined signs and symptoms indicating need for more acute intervention.  Patient verbalized understanding and all questions were answered.  Patient received an After-Visit Summary.     No orders of the defined types were placed in this encounter.  No orders of the defined types were placed in this encounter.  Referral Orders  No referral(s) requested today     Note is dictated utilizing voice recognition software. Although note has been proof read prior to signing, occasional typographical errors still can be missed. If any questions arise, please do not hesitate to call for verification.   electronically signed by:  Howard Pouch, DO  [place]

## 2019-09-01 NOTE — Telephone Encounter (Signed)
Received fax with bone density and mammogram results. HM updated and given to PCP for review.

## 2019-09-01 NOTE — Patient Instructions (Signed)
Nice to see you today.  You hopefully will see improvement in the next 1-2 days.  I will give Dr. Anitra Lauth the message about the Singulair and the test you had completed by neurology.

## 2019-09-05 ENCOUNTER — Other Ambulatory Visit: Payer: Self-pay

## 2019-09-05 ENCOUNTER — Telehealth: Payer: Self-pay | Admitting: Family Medicine

## 2019-09-05 ENCOUNTER — Encounter: Payer: Self-pay | Admitting: Family Medicine

## 2019-09-05 ENCOUNTER — Ambulatory Visit: Payer: Medicare Other | Admitting: Family Medicine

## 2019-09-05 MED ORDER — ALENDRONATE SODIUM 70 MG PO TABS: 70.0000 mg | ORAL_TABLET | ORAL | 3 refills | Status: DC

## 2019-09-05 NOTE — Telephone Encounter (Signed)
Pt's bone density test shows she has osteoporosis. I recommend starting a med that can help stop the decline in her bone density and therefore lower her risk of fracture. If pt agreeable, pls eRx fosamax 70mg , 1 tab po q week, #12, rf x 3. Drink in morning with empty stomach, drink with 8 oz of water and remain upright for 30 min after taking it. We'll repeat her bone density test in 1 yr.--thx

## 2019-09-05 NOTE — Telephone Encounter (Signed)
Patient contacted and advised of results. Agreeable to starting medication, Rx sent to local pharmacy.

## 2019-09-09 ENCOUNTER — Other Ambulatory Visit: Payer: Self-pay | Admitting: *Deleted

## 2019-09-09 NOTE — Patient Outreach (Signed)
[facility] [facility]) Care Management  09/09/2019  Priscilla Cantrell 1935/02/25 [id number]  RN Health Coach telephone call to patient.  [employer] compliance verified. She received a letter for patient assistance on her Trelegy. She is having a hard time understanding what it means.  [place] [facility] [phone]

## 2019-09-09 NOTE — Patient Outreach (Signed)
Referral from Frances Pleasant, RN to [facility] for Medication Assistance. 

## 2019-09-14 ENCOUNTER — Other Ambulatory Visit: Payer: Self-pay | Admitting: Family Medicine

## 2019-09-14 NOTE — Telephone Encounter (Signed)
Rx sent to pharmacy   

## 2019-09-15 ENCOUNTER — Other Ambulatory Visit: Payer: Self-pay

## 2019-09-15 DIAGNOSIS — R3915 Urgency of urination: Secondary | ICD-10-CM

## 2019-09-15 MED ORDER — MIRABEGRON ER 25 MG PO TB24: 25.0000 mg | ORAL_TABLET | Freq: Every day | ORAL | 0 refills | Status: DC

## 2019-09-15 MED ORDER — MIRABEGRON ER 25 MG PO TB24: 25.0000 mg | ORAL_TABLET | Freq: Every day | ORAL | 3 refills | Status: DC

## 2019-09-29 ENCOUNTER — Other Ambulatory Visit: Payer: Self-pay | Admitting: *Deleted

## 2019-09-29 NOTE — Patient Outreach (Signed)
[facility]) Care Management  [place]  09/29/2019   TERAN DAUGHENBAUGH 13-Feb-1935 [id number]  RN Health Coach telephone call to patient.  [employer] compliance verified. Per patient she is doing good. She has not any recent falls. Patient stated she is not having to use her nebulizer. Per patient she is using her nebulizer and they are working well. Patient is working with [place] on getting inhalers.  Patient is planning on getting flu shot and booster COVID vaccine. Patient has agreed to follow up outreach calls.   Encounter Medications:  Outpatient Encounter Medications as of 09/29/2019  Medication Sig  . albuterol (PROAIR HFA) 108 (90 Base) MCG/ACT inhaler Inhale 2 puffs into the lungs every 4 (four) hours as needed for wheezing or shortness of breath.  . Fluticasone-Umeclidin-Vilant (TRELEGY ELLIPTA) 100-62.5-25 MCG/INH AEPB Inhale 1 puff into the lungs daily.  Marland Kitchen albuterol (PROVENTIL) (2.5 MG/3ML) 0.083% nebulizer solution INHALE 1 VIAL VIA NEBULIZER EVERY 6 HOURS AS NEEDED FOR WHEEZING OR SHORTNESS OF BREATH (Patient not taking: Reported on 09/01/2019)  . alendronate (FOSAMAX) 70 MG tablet Take 1 tablet (70 mg total) by mouth once a week. Take with a full glass of water on an empty stomach.  Marland Kitchen amLODipine (NORVASC) 5 MG tablet TAKE 1 TABLET(5 MG) BY MOUTH DAILY  . Artificial Tear Solution (SOOTHE XP) SOLN Place 1 drop into both eyes 2 (two) times daily as needed (irritation).  Marland Kitchen aspirin EC 81 MG tablet Take 81 mg by mouth daily.  Marland Kitchen azelastine (ASTELIN) 0.1 % nasal spray Place 2 sprays into both nostrils 2 (two) times daily. Use in each nostril as directed  . clobetasol (TEMOVATE) 0.05 % external solution APPLY TO THE AFFECTED AREA EVERY DAY  . diazepam (VALIUM) 5 MG tablet TAKE 1 TABLET BY MOUTH EVERY NIGHT AT BEDTIME AND MAY TAKE AN ADDITIONAL TABLET AS NEEDED FOR ANXIETY  . fluorometholone (FML) 0.1 % ophthalmic suspension Place 1 drop into both eyes as directed.  Tapering off  . furosemide (LASIX) 20 MG tablet 1 tab po qd IF NEEDED for increased fluid collection in legs (Patient not taking: Reported on 09/01/2019)  . gabapentin (NEURONTIN) 100 MG capsule Take 2 capsules (200 mg total) by mouth at bedtime.  . mirabegron ER (MYRBETRIQ) 25 MG TB24 tablet Take 1 tablet (25 mg total) by mouth daily.  . Probiotic Product (PROBIOTIC DAILY PO) Take 1 capsule by mouth daily.   . roflumilast (DALIRESP) 500 MCG TABS tablet Take 1 tablet (500 mcg total) by mouth daily.  Marland Kitchen SYNTHROID 88 MCG tablet Take 1 tablet (88 mcg total) by mouth daily.  Marland Kitchen triamcinolone cream (KENALOG) 0.1 % Apply 1 application topically 2 (two) times daily.  Marland Kitchen venlafaxine (EFFEXOR) 50 MG tablet TAKE 1 TABLET BY MOUTH  TWICE DAILY  . vitamin B-12 (CYANOCOBALAMIN) 100 MCG tablet Take 100 mcg by mouth daily.  Marland Kitchen VITAMIN D PO Take by mouth daily. (Patient not taking: Reported on 09/01/2019)   No facility-administered encounter medications on file as of 09/29/2019.    Functional Status:  In your present state of health, do you have any difficulty performing the following activities: 04/05/2019  Hearing? N  Vision? N  Walking or climbing stairs? N  Dressing or bathing? N  Doing errands, shopping? N  Preparing Food and eating ? N  Using the Toilet? N  In the past six months, have you accidently leaked urine? Y  Comment at night when sleeping  Do you have problems with loss  of bowel control? N  Managing your Medications? N  Managing your Finances? Y  Comment for medications  Housekeeping or managing your Housekeeping? N  Some recent data might be hidden    Fall/Depression Screening: Fall Risk  09/29/2019 08/31/2019 04/05/2019  Falls in the past year? 1 1 1   Number falls in past yr: 0 1 1  Injury with Fall? 1 1 1   Comment - - -  Risk for fall due to : History of fall(s) History of fall(s) History of fall(s)  Risk for fall due to: Comment - - -  Follow up Falls evaluation completed Falls  evaluation completed Falls evaluation completed;Falls prevention discussed  Comment - - -   PHQ 2/9 Scores 04/05/2019 01/19/2019 11/03/2018 11/03/2018 08/20/2018 08/02/2018 08/02/2018  PHQ - 2 Score 1 0 0 0 0 0 0  PHQ- 9 Score - - - - - - -    Goals Addressed            This Visit's Progress   . Client will report no worsening of symptoms of COPD within the next 3 months   On track    [place] (see longtitudinal plan of care for additional care plan information)  Current Barriers:  Marland Kitchen Knowledge deficits related to basic understanding of COPD disease process . Knowledge deficits related to basic COPD self care/management . Knowledge deficit related to importance of energy conservation   Case Manager Clinical Goal(s):  Over the next 90 days, patient will be able to verbalize understanding of COPD action plan and when to seek appropriate levels of medical care  Over the next 90 days, patient will verbalize basic understanding of COPD disease process and self care activities  Over the next 90 days, patient will not be hospitalized for COPD exacerbation   Interventions:   Provided patient with basic written and verbal COPD education on self care/management/and exacerbation prevention   Provided patient with COPD action plan and reinforced importance of daily self assessment  Provided patient with education about the role of exercise in the management of COPD  Advised patient to engage in light exercise as tolerated 3-5 days a week  Patient Self Care Activities:  . Takes medications as prescribed including inhalers . Self assesses COPD action plan zone and makes appointment with provider if in the yellow zone for 48 hours without improvement. . Engages in light exercise 3-5 days a week . Utilizes infection prevention strategies to reduce risk of respiratory infection  . Does not currently perform self care activities related to COPD management  Please see past updates related  to this goal by clicking on past updates button in the selected goal        Assessment:  Patient has not had any readmissions for exacerbation Patient has COVID vaccine Patient is using inhalers as per ordered Patient has exercise routine of walking Patient is using oxygen intermittently as needed  Plan:  Patient will schedule Flu shot Patient will schedule COVID booster in November when available RN discussed exercise routine RN discussed healthy eating RN discussed rest periods RN will follow up within the month of November  Adan Baehr [place] [facility] [phone]

## 2019-10-04 ENCOUNTER — Other Ambulatory Visit: Payer: Self-pay | Admitting: Pulmonary Disease

## 2019-10-04 DIAGNOSIS — J9611 Chronic respiratory failure with hypoxia: Secondary | ICD-10-CM

## 2019-10-04 DIAGNOSIS — J449 Chronic obstructive pulmonary disease, unspecified: Secondary | ICD-10-CM

## 2019-10-04 MED ORDER — TRELEGY ELLIPTA 100-62.5-25 MCG/INH IN AEPB: 1.0000 | INHALATION_SPRAY | Freq: Every day | RESPIRATORY_TRACT | 12 refills | Status: DC

## 2019-10-06 ENCOUNTER — Telehealth: Payer: Self-pay | Admitting: Neurology

## 2019-10-06 NOTE — Telephone Encounter (Signed)
Patient called and said, "I'm continuing to have problems with the gabapentin. I don't feel right and I feel unsteady when walking. I took one last night and will not be taking this medication again."  [facility] in [place]

## 2019-10-07 NOTE — Telephone Encounter (Signed)
Patient notified and voiced understanding.

## 2019-10-07 NOTE — Telephone Encounter (Signed)
OK to stop gabapentin.

## 2019-10-11 DIAGNOSIS — H5032 Intermittent alternating esotropia: Secondary | ICD-10-CM | POA: Diagnosis not present

## 2019-10-11 DIAGNOSIS — H538 Other visual disturbances: Secondary | ICD-10-CM | POA: Diagnosis not present

## 2019-10-11 DIAGNOSIS — Z961 Presence of intraocular lens: Secondary | ICD-10-CM | POA: Diagnosis not present

## 2019-10-19 ENCOUNTER — Telehealth: Payer: Self-pay

## 2019-10-19 NOTE — Telephone Encounter (Signed)
Spoke with patient this afternoon and explains she felt like she was in a fog. She has been having issues with vision lately. BP was taken while on the phone and it was 148/75 with HR 72. She did take BP med this morning around 10 or 11. Please advise of recommendations if any.  Please advise, thanks.

## 2019-10-19 NOTE — Telephone Encounter (Signed)
Patient called in experiencing vision, took bp 117/80, 107/85, 110/82. Please call.

## 2019-10-19 NOTE — Telephone Encounter (Signed)
Patient called back to say to took a nap and feels much better.  She also stated that she thinks that the reason why the numbers were off on her blood machine cuff because she needs to replace the batteries. It is not giving an accurate result.

## 2019-10-19 NOTE — Telephone Encounter (Signed)
FYI  Please see below

## 2019-10-20 ENCOUNTER — Ambulatory Visit: Payer: Medicare Other

## 2019-10-20 ENCOUNTER — Other Ambulatory Visit: Payer: Self-pay

## 2019-10-20 NOTE — Telephone Encounter (Signed)
Patient came in today at 11:15 for a blood pressure machine calibration. When she got here Lattie Haw informed me that wasn't something that we could do. I went out and talked to the patient. She said her most recent blood pressure that she took this morning was 133/80. She stated she felt fine but wasn't sure if she should take additional medication when it gets elevated. I advised her that we would share this information with Dr. Anitra Lauth and that someone will call her back to give her further direction regarding her medication.

## 2019-10-20 NOTE — Telephone Encounter (Signed)
Noted  

## 2019-10-20 NOTE — Telephone Encounter (Signed)
This is the kind of problem that just needs a routine follow up visit.

## 2019-10-21 DIAGNOSIS — H04123 Dry eye syndrome of bilateral lacrimal glands: Secondary | ICD-10-CM | POA: Diagnosis not present

## 2019-10-21 DIAGNOSIS — H35373 Puckering of macula, bilateral: Secondary | ICD-10-CM | POA: Diagnosis not present

## 2019-10-21 DIAGNOSIS — H02054 Trichiasis without entropian left upper eyelid: Secondary | ICD-10-CM | POA: Diagnosis not present

## 2019-10-21 DIAGNOSIS — H532 Diplopia: Secondary | ICD-10-CM | POA: Diagnosis not present

## 2019-10-21 NOTE — Telephone Encounter (Signed)
Spoke with pt and scheduled for OV [phone]

## 2019-10-21 NOTE — Telephone Encounter (Signed)
LM for pt to returncall

## 2019-10-24 ENCOUNTER — Telehealth: Payer: Self-pay

## 2019-10-24 NOTE — Telephone Encounter (Signed)
Spoke with a lady at [facility] who states she will fax over a copy of the Single Fiber EMG report in about twenty minutes.

## 2019-10-25 ENCOUNTER — Encounter: Payer: Self-pay | Admitting: Family Medicine

## 2019-10-25 ENCOUNTER — Other Ambulatory Visit: Payer: Self-pay

## 2019-10-25 ENCOUNTER — Ambulatory Visit (INDEPENDENT_AMBULATORY_CARE_PROVIDER_SITE_OTHER): Payer: Medicare Other | Admitting: Family Medicine

## 2019-10-25 VITALS — BP 153/81 | HR 65 | Temp 98.3°F | Resp 16 | Ht 60.0 in | Wt 130.0 lb

## 2019-10-25 DIAGNOSIS — Z23 Encounter for immunization: Secondary | ICD-10-CM

## 2019-10-25 DIAGNOSIS — I872 Venous insufficiency (chronic) (peripheral): Secondary | ICD-10-CM | POA: Diagnosis not present

## 2019-10-25 DIAGNOSIS — I10 Essential (primary) hypertension: Secondary | ICD-10-CM

## 2019-10-25 NOTE — Progress Notes (Signed)
OFFICE VISIT  10/25/2019  CC:  Chief Complaint  Patient presents with  . Hypertension    142/60s, 160/80-checked at [employer]  . Leg Discoloration    several weeks, no pain    HPI:    Patient is a 84 y.o. Caucasian female who presents for "bp concerns and legs".  About a week ago she had mild disequilib feeling so she checked her bp, 150s/60s, went to FD and it was 160s80.  She was reassured b/c she was asymptomatic at that time. She went home and started checking her bp.   Highest has been 140s.systolic, never over 80 since then.  She stopped gabapentin b/c it caused cognitive slowing/side effects, same with singulair.  Has some questions about her discoloration of LL's from her venous insufficiency/varicose vein dz. No pain or ulceration or new prob.  Her eye MD simply wanted her to have her medical doctor take a look at it.   Past Medical History:  Diagnosis Date  . Allergic rhinitis    with upper airway cough: as of 07/2017 pulm f/u she was instructed to use astelin, flonase, and saline more consistently  . BPPV (benign paroxysmal positional vertigo) 03/2018  . Chest pain, non-cardiac    Cardiac CT showed no signif obstructive dz, mildly elevated calcium level (Dr. [name]).  . Chronic cystitis with hematuria    [name]  . Chronic hypoxemic respiratory failure ([place]) 02/02/2015   2L oxygen 24/7  . Cold hands    NCS/EMGs normal.  Hand dysesthesias per neuro--reassured  . COPD (chronic obstructive pulmonary disease) (H[facility])    GOLD 4.  spirometry 01/10/09 FEV 0.97(52%), FEV1% 47.  With chronic bronchitis as of 07/2017.  . DDD (degenerative disc disease), cervical    1998 MRI C5-6 impingemt (left).  MRI 2021 (neurol) 1 level canal stenosis, 1 level foraminal stenosis--->PT  . DDD (degenerative disc disease), lumbar   . Depression with anxiety 04/15/2011  . Diarrheal disease Summer 2017   ? 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).  As of 10/05/15 pt reports GI dx'd her with C diff and rx'd more flagyl and she is also on cholestyramine.  As of 02/2016, pt's sx's resolved completely.  . Diverticulosis of colon    Procto '97 and colonoscopy 2002  . Dysplastic nevus of upper extremity 04/2014   R tricep ([name])  . Edema of both lower extremities due to peripheral venous insufficiency    Varicosities bilat, swelling L>R.  R baker's cyst.  Got vein clinic eval 02/2018->sclerotherapy recommended but since no hemorrhage or ulceration, insurance will not cover the procedure.  . Family history of adverse reaction to anesthesia    mother died when patient age 45 receiving anesthesia for thyroid surgery  . Fibrocystic breast disease    w/fibroadenoma.  Bx's showed NO atypia (Dr. Margot Chimes).  Mammo neg 2008.  Marland Kitchen GERD (gastroesophageal reflux disease)   . History of double vision    Ophthalmologist, Dr. Herbert Deaner, is further evaluating this with MRI  orbits and limited brain (myesthenia gravis testing neg 07/2010)  . History of home oxygen therapy    uses 2 liters at hs  . Hypertension    EKG 03/2010 normal  . Hypothyroidism    Hashimoto's, dx'd 1989  . Idiopathic peripheral neuropathy 04/2018   Sensory (numb feet) S1 distribution bilat, c/w spinal stenosis sensory neuropathy (no pain). 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  Labs Biron, MRI C spine->some spondylosis/stenosis.  UE NCS/EMS: normal 04/2019.  Marland Kitchen Lesion of right native kidney 2017   found on CT 02/2015.  F/u MRI 03/31/15 showed it to be smaller and less likely of any significance but repeat CT renal protocol in 34mo was recommended by radiology.  This was done 10/26/15 and showed no interval change in the lesion, but b/c the lesion enhances with contrast, radiol rec'd urol consult (b/c pap renal RCC could not be excluded).  Saw urol  03/06/16--stable on u/s 09/2016 and 09/2017.  . Osteoarthritis, hip, bilateral    she is s/p bilat THA as of 02/2018.  . Osteoporosis    radius, T score -2.5->rec'd alendronate 08/2019.  Marland Kitchen Pneumonia   . Postmenopausal atrophic vaginitis    improved with estrace 2 X/week.  . Prolapse of vaginal cuff after hysterectomy    10/2018: Dr. Wayne Both. Mild colpocele 04/2019 per GYN (no surgery)  . Pulmonary nodule    CT chest 12/10, June 2011.  No nodule on CT 06/2010.  Marland Kitchen Recurrent Clostridium difficile diarrhea    Episode 09/19/2016: resolved with prolonged course of flagyl.  11/2016 recurrence treated with 10d of Dificid (Fidaxomicin) by GI (Dr. Earlean Shawl).  . Recurrent UTI    likely related to pt's atrophic vaginitis.  Urol started vaginal estrace 03/2016.  . Rib fracture 09/2018   R 7th, laterally-->sustained when her dog pulled her over and she fell.  Marland Kitchen RLS (restless legs syndrome)    neuro->gabapentin trial 2020/21  . Shingles 02/2014   R abd/flank  . Spinal stenosis of lumbar region with neurogenic claudication    2008 MRI (L3-4, L4-5, L5-S1).  +S1 distribution sensory loss bilat    Past Surgical History:  Procedure Laterality Date  . ABDOMINAL HYSTERECTOMY  1978   no BSO per pt--nonmalignant reasons  . APPENDECTOMY    . BREAST CYST EXCISION     Last screening mammogram 10/2010 was normal.  . CHOLECYSTECTOMY  2001  . COLONOSCOPY  04/2005; 12/06/2015   2007 NORMAL.  2017 adenomatous polyp x 1.  Mod sigmoid diverticulosis (Dr. Earlean Shawl).    Marland Kitchen DEXA  05/18/2017   2019 NORMAL (T-score 0.1).  08/2019->T score -2.5. Rpt 08/2020.  Marland Kitchen HIP CLOSED REDUCTION Right 12/25/2015   Procedure: CLOSED REDUCTION HIP;  Surgeon: Nicholes Stairs, MD;  Location: [facility];  Service: Orthopedics;  Laterality: Right;  . NCV/EMS  10/2018   chronic and symmetric sensorimotor axonal polyneuropathy affecting the lower extremities  . TONSILLECTOMY    . TOTAL HIP ARTHROPLASTY Right 11/01/2014   Procedure: RIGHT TOTAL HIP  ARTHROPLASTY;  Surgeon: Latanya Maudlin, MD;  Location: [facility];  Service: Orthopedics;  Laterality: Right;  . TOTAL HIP ARTHROPLASTY Left 12/08/2017   Procedure: LEFT TOTAL HIP ARTHROPLASTY ANTERIOR APPROACH;  Surgeon: Paralee Cancel, MD;  Location: [facility];  Service: Orthopedics;  Laterality: Left;  70 mins  . TRANSTHORACIC ECHOCARDIOGRAM  08/2014   EF 60-65%, grade I diast dysfxn  . TUBAL LIGATION  1974    Outpatient Medications Prior to Visit  Medication Sig Dispense Refill  . albuterol (PROAIR HFA) 108 (90 Base) MCG/ACT inhaler Inhale 2 puffs into the lungs every 4 (four) hours as needed for wheezing or shortness of breath. 8.5 g 1  . alendronate (FOSAMAX) 70 MG tablet Take 1 tablet (70 mg total) by mouth once a week. Take with a full glass of water on an empty stomach. 12 tablet 3  . amLODipine (NORVASC) 5 MG tablet  TAKE 1 TABLET(5 MG) BY MOUTH DAILY 90 tablet 0  . Artificial Tear Solution (SOOTHE XP) SOLN Place 1 drop into both eyes 2 (two) times daily as needed (irritation).    Marland Kitchen aspirin EC 81 MG tablet Take 81 mg by mouth daily.    Marland Kitchen azelastine (ASTELIN) 0.1 % nasal spray Place 2 sprays into both nostrils 2 (two) times daily. Use in each nostril as directed 30 mL 3  . clobetasol (TEMOVATE) 0.05 % external solution APPLY TO THE AFFECTED AREA EVERY DAY    . diazepam (VALIUM) 5 MG tablet TAKE 1 TABLET BY MOUTH EVERY NIGHT AT BEDTIME AND MAY TAKE AN ADDITIONAL TABLET AS NEEDED FOR ANXIETY 90 tablet 5  . fluorometholone (FML) 0.1 % ophthalmic suspension Place 1 drop into both eyes as directed. Tapering off    . Fluticasone-Umeclidin-Vilant (TRELEGY ELLIPTA) 100-62.5-25 MCG/INH AEPB Inhale 1 puff into the lungs daily. 60 each 12  . mirabegron ER (MYRBETRIQ) 25 MG TB24 tablet Take 1 tablet (25 mg total) by mouth daily. 30 tablet 3  . Probiotic Product (PROBIOTIC DAILY PO) Take 1 capsule by mouth daily.     . roflumilast (DALIRESP) 500 MCG TABS tablet Take 1 tablet (500 mcg total) by mouth daily. 30  tablet 11  . SYNTHROID 88 MCG tablet Take 1 tablet (88 mcg total) by mouth daily. 30 tablet 5  . triamcinolone cream (KENALOG) 0.1 % Apply 1 application topically 2 (two) times daily. 30 g 0  . venlafaxine (EFFEXOR) 50 MG tablet TAKE 1 TABLET BY MOUTH  TWICE DAILY 180 tablet 1  . vitamin B-12 (CYANOCOBALAMIN) 100 MCG tablet Take 100 mcg by mouth daily.    . furosemide (LASIX) 20 MG tablet 1 tab po qd IF NEEDED for increased fluid collection in legs (Patient not taking: Reported on 09/01/2019) 30 tablet 3  . albuterol (PROVENTIL) (2.5 MG/3ML) 0.083% nebulizer solution INHALE 1 VIAL VIA NEBULIZER EVERY 6 HOURS AS NEEDED FOR WHEEZING OR SHORTNESS OF BREATH (Patient not taking: Reported on 09/01/2019) 75 mL 1  . gabapentin (NEURONTIN) 100 MG capsule Take 2 capsules (200 mg total) by mouth at bedtime. (Patient not taking: Reported on 10/25/2019) 180 capsule 3  . VITAMIN D PO Take by mouth daily. (Patient not taking: Reported on 09/01/2019)     No facility-administered medications prior to visit.    Allergies  Allergen Reactions  . Losartan Other (See Comments)    hyperkalemia  . Penicillins Hives, Swelling and Rash    Has patient had a PCN reaction causing immediate rash, facial/tongue/throat swelling, SOB or lightheadedness with hypotension: YES Has patient had a PCN reaction causing severe rash involving mucus membranes or skin necrosis: NO Has patient had a PCN reaction that required hospitalization NO Has patient had a PCN reaction occurring within the last 10 years: NO If all of the above answers are "NO", then may proceed with Cephalosporin use.   . Cefixime [Kdc:Cefixime] Other (See Comments)    unspecified  . Indocin [Indomethacin] Other (See Comments)    Painful tongue and throat  . Singulair [Montelukast Sodium]     Chest tightness, decreased mental clarity  . Sulfa Antibiotics Other (See Comments)    unspecified  . Ace Inhibitors Other (See Comments)    cough  . Statins Other (See  Comments)    Myalgias     ROS As per HPI  PE: Vitals with BMI 10/25/2019 09/01/2019 08/22/2019  Height 5\' 0"  5\' 0"  5\' 0"   Weight 130 lbs 132 lbs  10 oz 133 lbs 6 oz  BMI 25.39 54.5 62.56  Systolic 389 373 428  Diastolic 81 71 76  Pulse 65 61 67  Some encounter information is confidential and restricted. Go to Review Flowsheets activity to see all data.     Gen: Alert, well appearing.  Patient is oriented to person, place, time, and situation. AFFECT: pleasant, lucid thought and speech. CV: RRR, no m/r/g.   LUNGS: CTA bilat, nonlabored resps, good aeration in all lung fields. EXT: no clubbing or cyanosis.  2+ bilat ankle pitting edema.  Entire LL's with diffuse light freckling pigment changes c/w chronic venous insuff/varicose vein dz.   LABS:    Chemistry      Component Value Date/Time   NA 141 05/11/2019 1331   K 4.0 05/11/2019 1331   CL 106 05/11/2019 1331   CO2 26 05/11/2019 1331   BUN 16 05/11/2019 1331   CREATININE 0.76 05/11/2019 1331   CREATININE 0.84 02/27/2016 1533      Component Value Date/Time   CALCIUM 9.1 05/11/2019 1331   ALKPHOS 91 05/26/2018 0952   AST 12 05/26/2018 0952   ALT 11 05/26/2018 0952   BILITOT 0.6 05/26/2018 0952     Lab Results  Component Value Date   WBC 8.9 05/26/2018   HGB 13.7 05/26/2018   HCT 42.0 05/26/2018   MCV 86.9 05/26/2018   PLT 269.0 05/26/2018   Lab Results  Component Value Date   TSH 2.09 05/11/2019    IMPRESSION AND PLAN:  1) Elev bp with dx of HTN: coincidental with feeling of mild disequilibrium briefly. Given the level of bp elevation she reported having, I don't think the two were connected.  I think she was simply having some of her "normal" variation in her bp's. She has subsequently felt well.  No w/u and no med changes at this time. Obs.  2) LE venous insufficiency edema with it's associated chronic skin pigmentation changes.  No change.  Reassured.  An After Visit Summary was printed and given to  the patient.  FOLLOW UP: Return in about 3 months (around 01/24/2020) for routine chronic illness f/u.  Signed:  Crissie Sickles, MD           10/25/2019

## 2019-10-27 ENCOUNTER — Telehealth: Payer: Self-pay | Admitting: Pulmonary Disease

## 2019-10-27 MED ORDER — TRELEGY ELLIPTA 100-62.5-25 MCG/INH IN AEPB
1.0000 | INHALATION_SPRAY | Freq: Every day | RESPIRATORY_TRACT | 0 refills | Status: DC
Start: 2019-10-27 — End: 2019-11-10

## 2019-10-27 NOTE — Telephone Encounter (Signed)
Rx has been printed for Aaron Edelman to sign. Routing the encounter back to him.

## 2019-10-27 NOTE — Telephone Encounter (Signed)
Records have been received per Dr Posey Pronto.

## 2019-10-27 NOTE — Telephone Encounter (Signed)
10/27/2019  Can provide patient with printed prescription of Trelegy Ellipta.  Trelegy Ellipta coupon card also needs to be provided.  Will route message to triage for follow-up and contacting for the patient.  Wyn Quaker, FNP

## 2019-10-27 NOTE — Telephone Encounter (Signed)
Rx has been handed back to me signed by Aaron Edelman. Called and spoke with pt letting her know that we were placing free trial card for a 30-day of trelegy up front as well as Rx that needs to go with it. Stated to her that she needed to take this info to the pharmacy as we do not have any samples of Trelegy. Pt verbalized understanding. Rx and card have been placed up front for pt to come by office to pick up. Nothing further needed.

## 2019-11-02 ENCOUNTER — Other Ambulatory Visit: Payer: Self-pay

## 2019-11-02 DIAGNOSIS — N390 Urinary tract infection, site not specified: Secondary | ICD-10-CM

## 2019-11-03 ENCOUNTER — Other Ambulatory Visit: Payer: Medicare Other

## 2019-11-03 ENCOUNTER — Other Ambulatory Visit: Payer: Self-pay

## 2019-11-03 DIAGNOSIS — N390 Urinary tract infection, site not specified: Secondary | ICD-10-CM | POA: Diagnosis not present

## 2019-11-03 LAB — MICROSCOPIC EXAMINATION
Epithelial Cells (non renal): >10 /hpf — AB (ref 0–10)
Renal Epithel, UA: NONE SEEN /hpf

## 2019-11-03 LAB — URINALYSIS, ROUTINE W REFLEX MICROSCOPIC
Bilirubin, UA: NEGATIVE
Glucose, UA: NEGATIVE
Ketones, UA: NEGATIVE
Nitrite, UA: NEGATIVE
Protein,UA: NEGATIVE
RBC, UA: NEGATIVE
Specific Gravity, UA: 1.015 (ref 1.005–1.030)
Urobilinogen, Ur: 0.2 mg/dL (ref 0.2–1.0)
pH, UA: 5.5 (ref 5.0–7.5)

## 2019-11-07 LAB — CULTURE, URINE COMPREHENSIVE

## 2019-11-09 NOTE — Progress Notes (Signed)
Sent mychart

## 2019-11-10 ENCOUNTER — Other Ambulatory Visit: Payer: Self-pay

## 2019-11-10 ENCOUNTER — Encounter: Payer: Self-pay | Admitting: Family Medicine

## 2019-11-10 ENCOUNTER — Ambulatory Visit (INDEPENDENT_AMBULATORY_CARE_PROVIDER_SITE_OTHER): Payer: Medicare Other | Admitting: Family Medicine

## 2019-11-10 VITALS — BP 120/75 | HR 65 | Temp 97.6°F | Resp 16 | Ht 60.0 in | Wt 135.4 lb

## 2019-11-10 DIAGNOSIS — I872 Venous insufficiency (chronic) (peripheral): Secondary | ICD-10-CM

## 2019-11-10 DIAGNOSIS — I8393 Asymptomatic varicose veins of bilateral lower extremities: Secondary | ICD-10-CM

## 2019-11-10 NOTE — Progress Notes (Signed)
OFFICE VISIT  11/10/2019  CC:  Chief Complaint  Patient presents with  . Rash    lower legs, x1 month. She has been using triamcinolone cream for relief    HPI:    Patient is a 84 y.o. Caucasian female who presents for rash on lower legs. I last saw her about 3 wks ago. A/P as of last visit: "1) Elev bp with dx of HTN: coincidental with feeling of mild disequilibrium briefly. Given the level of bp elevation she reported having, I don't think the two were connected.  I think she was simply having some of her "normal" variation in her bp's. She has subsequently felt well.  No w/u and no med changes at this time. Obs.  2) LE venous insufficiency edema with it's associated chronic skin pigmentation changes.  No change.  Reassured."  HPI: Itchy patch of skin on L LL posterolateral aspect, oval and about fist sized, dark pink.  Onset about 1 mo ago but got more concerning about 2 wks ago.  Started applying kenalog 0.1% ointment and it has gradually helped.  No pain or fever or malaise.  Gabapentin (Dr. Dennis Bast too much cognitive side effects so she stopped it.  Feels like she is sleeping too much. Easily sleeps early in evening, wakes up 3 AM. She is active/busy all day long.  She does not have any persistent feeling of depression. Unknown whether she snores or not or has apneic periods in sleep.  No awakening with gasp.  No morning headaches.   Past Medical History:  Diagnosis Date  . Allergic rhinitis    with upper airway cough: as of 07/2017 pulm f/u she was instructed to use astelin, flonase, and saline more consistently  . BPPV (benign paroxysmal positional vertigo) 03/2018  . Chest pain, non-cardiac    Cardiac CT showed no signif obstructive dz, mildly elevated calcium level (Dr. [name]).  . Chronic cystitis with hematuria    [name]  . Chronic hypoxemic respiratory failure ([place]) 02/02/2015   2L oxygen 24/7  . Cold hands    NCS/EMGs normal.  Hand  dysesthesias per neuro--reassured  . COPD (chronic obstructive pulmonary disease) ([facility])    GOLD 4.  spirometry 01/10/09 FEV 0.97(52%), FEV1% 47.  With chronic bronchitis as of 07/2017.  . DDD (degenerative disc disease), cervical    1998 MRI C5-6 impingemt (left).  MRI 2021 (neurol) 1 level canal stenosis, 1 level foraminal stenosis--->PT  . DDD (degenerative disc disease), lumbar   . Depression with anxiety 04/15/2011  . Diarrheal disease Summer 2017   ? 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).  As of 10/05/15 pt reports GI dx'd her with C diff and rx'd more flagyl and she is also on cholestyramine.  As of 02/2016, pt's sx's resolved completely.  . Diverticulosis of colon    Procto '97 and colonoscopy 2002  . Dysplastic nevus of upper extremity 04/2014   R tricep ([name])  . Edema of both lower extremities due to peripheral venous insufficiency    Varicosities bilat, swelling L>R.  R baker's cyst.  Got vein clinic eval 02/2018->sclerotherapy recommended but since no hemorrhage or ulceration, insurance will not cover the procedure.  . Family history of adverse reaction to anesthesia    mother died when patient age 77 receiving anesthesia for thyroid surgery  . Fibrocystic breast disease    w/fibroadenoma.  Bx's showed NO atypia (Dr. Margot Chimes).  Mammo neg 2008.  Marland Kitchen GERD (gastroesophageal reflux disease)   . History of double vision    Ophthalmologist, Dr. Herbert Deaner, is further evaluating this with MRI  orbits and limited brain (myesthenia gravis testing neg 07/2010)  . History of home oxygen therapy    uses 2 liters at hs  . Hypertension    EKG 03/2010 normal  . Hypothyroidism    Hashimoto's, dx'd 1989  . Idiopathic peripheral neuropathy 04/2018   Sensory (numb feet) S1 distribution bilat, c/w spinal stenosis sensory neuropathy (no pain). NCS/EMG 10/2018-> Chronic symmetric sensorimotor axonal  polyneuropathy affecting the lower extremities.  Labs [place], MRI C spine->some spondylosis/stenosis.  UE NCS/EMS: normal 04/2019.  Marland Kitchen Lesion of right native kidney 2017   found on CT 02/2015.  F/u MRI 03/31/15 showed it to be smaller and less likely of any significance but repeat CT renal protocol in 77mo was recommended by radiology.  This was done 10/26/15 and showed no interval change in the lesion, but b/c the lesion enhances with contrast, radiol rec'd urol consult (b/c pap renal RCC could not be excluded).  Saw urol 03/06/16--stable on u/s 09/2016 and 09/2017.  . Osteoarthritis, hip, bilateral    she is s/p bilat THA as of 02/2018.  . Osteoporosis    radius, T score -2.5->rec'd alendronate 08/2019.  Marland Kitchen Pneumonia   . Postmenopausal atrophic vaginitis    improved with estrace 2 X/week.  . Prolapse of vaginal cuff after hysterectomy    10/2018: Dr. Wayne Both. Mild colpocele 04/2019 per GYN (no surgery)  . Pulmonary nodule    CT chest 12/10, June 2011.  No nodule on CT 06/2010.  Marland Kitchen Recurrent Clostridium difficile diarrhea    Episode 09/19/2016: resolved with prolonged course of flagyl.  11/2016 recurrence treated with 10d of Dificid (Fidaxomicin) by GI (Dr. Earlean Shawl).  . Recurrent UTI    likely related to pt's atrophic vaginitis.  Urol started vaginal estrace 03/2016.  . Rib fracture 09/2018   R 7th, laterally-->sustained when her dog pulled her over and she fell.  Marland Kitchen RLS (restless legs syndrome)    neuro->gabapentin trial 2020/21  . Shingles 02/2014   R abd/flank  . Spinal stenosis of lumbar region with neurogenic claudication    2008 MRI (L3-4, L4-5, L5-S1).  +S1 distribution sensory loss bilat    Past Surgical History:  Procedure Laterality Date  . ABDOMINAL HYSTERECTOMY  1978   no BSO per pt--nonmalignant reasons  . APPENDECTOMY    . BREAST CYST EXCISION     Last screening mammogram 10/2010 was normal.  . CHOLECYSTECTOMY  2001  . COLONOSCOPY  04/2005; 12/06/2015   2007 NORMAL.  2017 adenomatous  polyp x 1.  Mod sigmoid diverticulosis (Dr. Earlean Shawl).    Marland Kitchen DEXA  05/18/2017   2019 NORMAL (T-score 0.1).  08/2019->T score -2.5. Rpt 08/2020.  Marland Kitchen HIP CLOSED REDUCTION Right 12/25/2015   Procedure: CLOSED REDUCTION HIP;  Surgeon: Nicholes Stairs, MD;  Location: [facility];  Service: Orthopedics;  Laterality: Right;  . NCV/EMS  10/2018   chronic and symmetric sensorimotor axonal polyneuropathy affecting the lower extremities  . TONSILLECTOMY    . TOTAL HIP ARTHROPLASTY Right 11/01/2014   Procedure: RIGHT TOTAL HIP ARTHROPLASTY;  Surgeon: Latanya Maudlin, MD;  Location: [facility];  Service: Orthopedics;  Laterality: Right;  . TOTAL HIP ARTHROPLASTY Left 12/08/2017   Procedure: LEFT TOTAL HIP ARTHROPLASTY ANTERIOR APPROACH;  Surgeon: Paralee Cancel, MD;  Location: [facility];  Service: Orthopedics;  Laterality: Left;  70 mins  . TRANSTHORACIC  ECHOCARDIOGRAM  08/2014   EF 60-65%, grade I diast dysfxn  . TUBAL LIGATION  1974    Outpatient Medications Prior to Visit  Medication Sig Dispense Refill  . alendronate (FOSAMAX) 70 MG tablet Take 1 tablet (70 mg total) by mouth once a week. Take with a full glass of water on an empty stomach. 12 tablet 3  . amLODipine (NORVASC) 5 MG tablet TAKE 1 TABLET(5 MG) BY MOUTH DAILY 90 tablet 0  . Artificial Tear Solution (SOOTHE XP) SOLN Place 1 drop into both eyes 2 (two) times daily as needed (irritation).    Marland Kitchen aspirin EC 81 MG tablet Take 81 mg by mouth daily.    . clobetasol (TEMOVATE) 0.05 % external solution APPLY TO THE AFFECTED AREA EVERY DAY    . diazepam (VALIUM) 5 MG tablet TAKE 1 TABLET BY MOUTH EVERY NIGHT AT BEDTIME AND MAY TAKE AN ADDITIONAL TABLET AS NEEDED FOR ANXIETY 90 tablet 5  . fluorometholone (FML) 0.1 % ophthalmic suspension Place 1 drop into both eyes as directed. Tapering off    . Fluticasone-Umeclidin-Vilant (TRELEGY ELLIPTA) 100-62.5-25 MCG/INH AEPB Inhale 1 puff into the lungs daily. 60 each 12  . mirabegron ER (MYRBETRIQ) 25 MG TB24 tablet Take 1  tablet (25 mg total) by mouth daily. 30 tablet 3  . Probiotic Product (PROBIOTIC DAILY PO) Take 1 capsule by mouth daily.     . roflumilast (DALIRESP) 500 MCG TABS tablet Take 1 tablet (500 mcg total) by mouth daily. 30 tablet 11  . SYNTHROID 88 MCG tablet Take 1 tablet (88 mcg total) by mouth daily. 30 tablet 5  . triamcinolone cream (KENALOG) 0.1 % Apply 1 application topically 2 (two) times daily. 30 g 0  . venlafaxine (EFFEXOR) 50 MG tablet TAKE 1 TABLET BY MOUTH  TWICE DAILY 180 tablet 1  . vitamin B-12 (CYANOCOBALAMIN) 100 MCG tablet Take 100 mcg by mouth daily.    Marland Kitchen albuterol (PROAIR HFA) 108 (90 Base) MCG/ACT inhaler Inhale 2 puffs into the lungs every 4 (four) hours as needed for wheezing or shortness of breath. (Patient not taking: Reported on 11/10/2019) 8.5 g 1  . azelastine (ASTELIN) 0.1 % nasal spray Place 2 sprays into both nostrils 2 (two) times daily. Use in each nostril as directed (Patient not taking: Reported on 11/10/2019) 30 mL 3  . furosemide (LASIX) 20 MG tablet 1 tab po qd IF NEEDED for increased fluid collection in legs (Patient not taking: Reported on 09/01/2019) 30 tablet 3  . Fluticasone-Umeclidin-Vilant (TRELEGY ELLIPTA) 100-62.5-25 MCG/INH AEPB Inhale 1 puff into the lungs daily. (Patient not taking: Reported on 11/10/2019) 60 each 0   No facility-administered medications prior to visit.    Allergies  Allergen Reactions  . Losartan Other (See Comments)    hyperkalemia  . Penicillins Hives, Swelling and Rash    Has patient had a PCN reaction causing immediate rash, facial/tongue/throat swelling, SOB or lightheadedness with hypotension: YES Has patient had a PCN reaction causing severe rash involving mucus membranes or skin necrosis: NO Has patient had a PCN reaction that required hospitalization NO Has patient had a PCN reaction occurring within the last 10 years: NO If all of the above answers are "NO", then may proceed with Cephalosporin use.   . Cefixime  [Kdc:Cefixime] Other (See Comments)    unspecified  . Gabapentin Other (See Comments)    Feels woozy  . Indocin [Indomethacin] Other (See Comments)    Painful tongue and throat  . Singulair [Montelukast Sodium]  Chest tightness, decreased mental clarity  . Sulfa Antibiotics Other (See Comments)    unspecified  . Ace Inhibitors Other (See Comments)    cough  . Statins Other (See Comments)    Myalgias     ROS As per HPI  PE: Vitals with BMI 11/10/2019 10/25/2019 09/01/2019  Height 5\' 0"  5\' 0"  5\' 0"   Weight 135 lbs 6 oz 130 lbs 132 lbs 10 oz  BMI 26.44 40.10 27.2  Systolic 536 644 034  Diastolic 75 81 71  Pulse 65 65 61  Some encounter information is confidential and restricted. Go to Review Flowsheets activity to see all data.     Gen: Alert, well appearing.  Patient is oriented to person, place, time, and situation. AFFECT: pleasant, lucid thought and speech. Bilat LL 2+ pitting edema in ankles. Posterolateral aspect of L calf with 6 cm x 10 cm oval-shaped area of pinkish macular rash that blanches some to touch. Scattered varicosities bilat LLs, no inflammation.    LABS:    Chemistry      Component Value Date/Time   NA 141 05/11/2019 1331   K 4.0 05/11/2019 1331   CL 106 05/11/2019 1331   CO2 26 05/11/2019 1331   BUN 16 05/11/2019 1331   CREATININE 0.76 05/11/2019 1331   CREATININE 0.84 02/27/2016 1533      Component Value Date/Time   CALCIUM 9.1 05/11/2019 1331   ALKPHOS 91 05/26/2018 0952   AST 12 05/26/2018 0952   ALT 11 05/26/2018 0952   BILITOT 0.6 05/26/2018 0952     Lab Results  Component Value Date   WBC 8.9 05/26/2018   HGB 13.7 05/26/2018   HCT 42.0 05/26/2018   MCV 86.9 05/26/2018   PLT 269.0 05/26/2018   Lab Results  Component Value Date   TSH 2.09 05/11/2019    IMPRESSION AND PLAN:  Venous stasis dermatitis, improving with triamcinolone 0.1% ointment. This is in the setting of bilat mild pitting edema from chronic venous  insufficiency + varicose veins (asymptomatic). Continue ointment, did more education on this condition and signs to watch for that should prompt her to call or return. She will continue to use lasix 20mg  qd PRN for her LL edema---she rarely has to use this and does not need to use it at the current time. Low Na diet reiterated.  Evening sleepiness--no red flags. Obs.  Paresthesias: in the midst of w/u and treatment by neurologist.  An After Visit Summary was printed and given to the patient.  FOLLOW UP: Return if symptoms worsen or fail to improve.  Signed:  Crissie Sickles, MD           11/10/2019

## 2019-11-11 ENCOUNTER — Ambulatory Visit (INDEPENDENT_AMBULATORY_CARE_PROVIDER_SITE_OTHER): Payer: Medicare Other | Admitting: Neurology

## 2019-11-11 ENCOUNTER — Encounter: Payer: Self-pay | Admitting: Neurology

## 2019-11-11 VITALS — BP 157/79 | HR 77 | Ht 60.0 in | Wt 135.0 lb

## 2019-11-11 DIAGNOSIS — G629 Polyneuropathy, unspecified: Secondary | ICD-10-CM

## 2019-11-11 DIAGNOSIS — G2581 Restless legs syndrome: Secondary | ICD-10-CM

## 2019-11-11 DIAGNOSIS — G959 Disease of spinal cord, unspecified: Secondary | ICD-10-CM | POA: Diagnosis not present

## 2019-11-11 NOTE — Patient Instructions (Signed)
Start physical therapy for balance training at [facility] your feet daily  Return to clinic in 1 year

## 2019-11-11 NOTE — Progress Notes (Signed)
    Follow-up Visit   Date: 11/11/19   Priscilla Cantrell MRN: [id number] DOB: [birth year]   Interim History: Priscilla Cantrell is a 83 y.o. right-handed Caucasian female with GERD, hypertension, cervical canal stenosis returning to the clinic for follow-up of neuropathy.  The patient was accompanied to the clinic by self.  She stopped gabapentin due to intolerance, but had not appreciated any change in RLS. She takes mustard as needed which helps.   She continues to have numbness in the feet, which is unchanged.  She has not suffered any falls or weakness. However, she does endorse imbalance, especially if she is looking at her feet when walking.  Her double vision has improved with prisms.  No evidence of NMJ disorder on single fiber EMG. She denies ongoing neck pain or arm paresthesias.    Neurological Exam: MENTAL STATUS including orientation to time, place, person, recent and remote memory, attention span and concentration, language, and fund of knowledge is normal.  Speech is not dysarthric.  CRANIAL NERVES:  Pupils equal round and reactive to light.  Normal conjugate, extra-ocular eye movements in all directions of gaze.  No ptosis. Face is symmetric.   MOTOR:  Motor strength is 5/5 in all extremities, including distally.  No atrophy, fasciculations or abnormal movements.  No pronator drift.  Tone is normal.    MSRs:  Reflexes are 2+/4 throughout, except absent in the ankles.  SENSORY:  Vibration is reduced at the ankles bilaterally.  COORDINATION/GAIT:  Normal finger-to- nose-finger.  Intact rapid alternating movements bilaterally.  Gait narrow based and stable, stooped posture.    Data: NCS/EMG bilateral upper extremities 04/05/2019:   This is a normal study of the upper extremities.  In particular, there is no evidence of carpal tunnel syndrome, ulnar neuropathy, or cervical radiculopathy affecting the upper extremities.  Incidentally, there is evidence of bilateral [name]-Gruber  anastomoses, a normal anatomic variant.  MRI cervical spine 12/03/2018: 1. Moderate bilateral foraminal stenosis at C5-6 which could affect either or both C6 nerves. 2. Slight compression of the ventral aspect of the spinal cord at C4-5 and C5-6 without myelopathy.  IMPRESSION/PLAN: 1.  Idiopathic peripheral neuropathy with sensory ataxia and numbness in the feet  - Start physical therapy for balance training  - Check feet daily  - She had questions regarding appropriate shoewear which I addressed, recommend soles with a hard sole and good ankle support.  Do not wear flipflops.   2.  Restless leg syndrome, stable  - Discontinue gabapentin due to cognitive side effects  - Monitor off medications, she seems to get adequate relief using mustard  3.   Cervical canal spondylosis with foraminal stenosis at C5-6 and canal stenosis at C4-5 and C5-6, stable.  Previously completed PT which helped arm paresthesia, strength, and stablility.  4.  Diplopia, no evidence of neuromuscular junction disorder.  Normal single fiber EMG - results reviewed.   - Symptoms improved with prisms  Return to clinic in 1 year  Thank you for allowing me to participate in patient's care.  If I can answer any additional questions, I would be pleased to do so.    Sincerely,    Telma Pyeatt K. Posey Pronto, DO

## 2019-11-11 NOTE — Addendum Note (Signed)
Addended by: Armen Pickup A on: 11/11/2019 11:34 AM   Modules accepted: Orders

## 2019-11-15 ENCOUNTER — Other Ambulatory Visit: Payer: Self-pay | Admitting: Family Medicine

## 2019-11-24 ENCOUNTER — Telehealth: Payer: Self-pay

## 2019-11-24 NOTE — Telephone Encounter (Signed)
Ok

## 2019-11-24 NOTE — Telephone Encounter (Signed)
Pt reports increased in UTI lately and  Feels like she has developed another UTI. Advise pt best to see MD since her symptoms have returned so quickly after last treatment. appt scheduled with pt.

## 2019-11-24 NOTE — Telephone Encounter (Signed)
Pt is scheduled for tomorrow. Dr. Alyson Ingles said she could drop off a urine. She keeps having frequent UTI?? Patient wants to see MD tomorow

## 2019-11-24 NOTE — Telephone Encounter (Signed)
Have her drop off a urine

## 2019-11-25 ENCOUNTER — Ambulatory Visit (INDEPENDENT_AMBULATORY_CARE_PROVIDER_SITE_OTHER): Payer: Medicare Other | Admitting: Urology

## 2019-11-25 ENCOUNTER — Other Ambulatory Visit: Payer: Self-pay | Admitting: Urology

## 2019-11-25 ENCOUNTER — Encounter: Payer: Self-pay | Admitting: Urology

## 2019-11-25 ENCOUNTER — Other Ambulatory Visit: Payer: Self-pay

## 2019-11-25 VITALS — BP 135/67 | HR 85 | Temp 97.0°F | Ht 60.0 in | Wt 135.0 lb

## 2019-11-25 DIAGNOSIS — R3915 Urgency of urination: Secondary | ICD-10-CM

## 2019-11-25 DIAGNOSIS — N3021 Other chronic cystitis with hematuria: Secondary | ICD-10-CM | POA: Diagnosis not present

## 2019-11-25 LAB — BLADDER SCAN AMB NON-IMAGING: Scan Result: 2

## 2019-11-25 MED ORDER — NITROFURANTOIN MONOHYD MACRO 100 MG PO CAPS: 100.0000 mg | ORAL_CAPSULE | Freq: Two times a day (BID) | ORAL | 0 refills | Status: DC

## 2019-11-25 MED ORDER — NITROFURANTOIN MACROCRYSTAL 50 MG PO CAPS: 50.0000 mg | ORAL_CAPSULE | Freq: Every day | ORAL | 3 refills | Status: DC

## 2019-11-25 NOTE — Progress Notes (Signed)
11/25/2019 8:59 AM   Priscilla Cantrell 08/14/1935 [id number]  Referring provider: [name], MD [address] [place],  [place] [address]  Dysuria and frequent UTI  HPI: Ms Priscilla Cantrell is a 84yo here for followup for recurrent UTI. Since she was last seen 08/2019 she has had 3 UTI. Her last UTI symptoms started 2 days ago. She has dysuria and vaginal irritation, urinary frequency, urgency and nocturia. She wears a pad which is rarely damp. She was using topical estrogen.    PMH: Past Medical History:  Diagnosis Date  . Allergic rhinitis    with upper airway cough: as of 07/2017 pulm f/u she was instructed to use astelin, flonase, and saline more consistently  . BPPV (benign paroxysmal positional vertigo) 03/2018  . Chest pain, non-cardiac    Cardiac CT showed no signif obstructive dz, mildly elevated calcium level (Dr. Stanford Cantrell).  . Chronic cystitis with hematuria    Dr. Demetrios Cantrell  . Chronic hypoxemic respiratory failure ([place]) 02/02/2015   2L oxygen 24/7  . Cold hands    NCS/EMGs normal.  Hand dysesthesias per neuro--reassured  . COPD (chronic obstructive pulmonary disease) ([facility])    GOLD 4.  spirometry 01/10/09 FEV 0.97(52%), FEV1% 47.  With chronic bronchitis as of 07/2017.  . DDD (degenerative disc disease), cervical    1998 MRI C5-6 impingemt (left).  MRI 2021 (neurol) 1 level canal stenosis, 1 level foraminal stenosis--->PT  . DDD (degenerative disc disease), lumbar   . Depression with anxiety 04/15/2011  . Diarrheal disease Summer 2017   ? 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 Dr Priscilla Cantrell).  As of 10/05/15 pt reports GI dx'd her with C diff and rx'd more flagyl and she is also on cholestyramine.  As of 02/2016, pt's sx's resolved completely.  . Diverticulosis of colon    Procto '97 and colonoscopy 2002  . Dysplastic nevus of upper extremity 04/2014   R tricep (Dr.  Denna Cantrell)  . Edema of Cantrell lower extremities due to peripheral venous insufficiency    Varicosities bilat, swelling L>R.  R baker's cyst.  Got vein clinic eval 02/2018->sclerotherapy recommended but since no hemorrhage or ulceration, insurance will not cover the procedure.  . Family history of adverse reaction to anesthesia    mother died when patient age 72 receiving anesthesia for thyroid surgery  . Fibrocystic breast disease    w/fibroadenoma.  Bx's showed NO atypia (Dr. Margot Cantrell).  Mammo neg 2008.  Marland Kitchen GERD (gastroesophageal reflux disease)   . History of double vision    Ophthalmologist, Dr. Herbert Cantrell, is further evaluating this with MRI  orbits and limited brain (myesthenia gravis testing neg 07/2010)  . History of home oxygen therapy    uses 2 liters at hs  . Hypertension    EKG 03/2010 normal  . Hypothyroidism    Hashimoto's, dx'd 1989  . Idiopathic peripheral neuropathy 04/2018   Sensory (numb feet) S1 distribution bilat, c/w spinal stenosis sensory neuropathy (no pain). 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  Labs [place], MRI C spine->some spondylosis/stenosis.  UE NCS/EMS: normal 04/2019.  Marland Kitchen Lesion of right native kidney 2017   found on CT 02/2015.  F/u MRI 03/31/15 showed it to be smaller and less likely of any significance but repeat CT renal protocol in 8mo was recommended by radiology.  This was done 10/26/15 and showed no interval change in the lesion, but b/c  the lesion enhances with contrast, radiol rec'd urol consult (b/c pap renal RCC could not be excluded).  Saw urol 03/06/16--stable on u/s 09/2016 and 09/2017.  . Osteoarthritis, hip, bilateral    she is s/p bilat THA as of 02/2018.  . Osteoporosis    radius, T score -2.5->rec'd alendronate 08/2019.  Marland Kitchen Pneumonia   . Postmenopausal atrophic vaginitis    improved with estrace 2 X/week.  . Prolapse of vaginal cuff after hysterectomy    10/2018: Dr. Wayne Cantrell. Mild colpocele 04/2019  per GYN (no surgery)  . Pulmonary nodule    CT chest 12/10, June 2011.  No nodule on CT 06/2010.  Marland Kitchen Recurrent Clostridium difficile diarrhea    Episode 09/19/2016: resolved with prolonged course of flagyl.  11/2016 recurrence treated with 10d of Dificid (Fidaxomicin) by GI (Dr. Earlean Cantrell).  . Recurrent UTI    likely related to pt's atrophic vaginitis.  Urol started vaginal estrace 03/2016.  . Rib fracture 09/2018   R 7th, laterally-->sustained when her dog pulled her over and she fell.  Marland Kitchen RLS (restless legs syndrome)    neuro->gabapentin trial 2020/21  . Shingles 02/2014   R abd/flank  . Spinal stenosis of lumbar region with neurogenic claudication    2008 MRI (L3-4, L4-5, L5-S1).  +S1 distribution sensory loss bilat    Surgical History: Past Surgical History:  Procedure Laterality Date  . ABDOMINAL HYSTERECTOMY  1978   no BSO per pt--nonmalignant reasons  . APPENDECTOMY    . BREAST CYST EXCISION     Last screening mammogram 10/2010 was normal.  . CHOLECYSTECTOMY  2001  . COLONOSCOPY  04/2005; 12/06/2015   2007 NORMAL.  2017 adenomatous polyp x 1.  Mod sigmoid diverticulosis (Dr. Earlean Cantrell).    Marland Kitchen DEXA  05/18/2017   2019 NORMAL (T-score 0.1).  08/2019->T score -2.5. Rpt 08/2020.  Marland Kitchen HIP CLOSED REDUCTION Right 12/25/2015   Procedure: CLOSED REDUCTION HIP;  Surgeon: Priscilla Stairs, MD;  Location: [facility];  Service: Orthopedics;  Laterality: Right;  . NCV/EMS  10/2018   chronic and symmetric sensorimotor axonal polyneuropathy affecting the lower extremities  . TONSILLECTOMY    . TOTAL HIP ARTHROPLASTY Right 11/01/2014   Procedure: RIGHT TOTAL HIP ARTHROPLASTY;  Surgeon: Priscilla Maudlin, MD;  Location: [facility];  Service: Orthopedics;  Laterality: Right;  . TOTAL HIP ARTHROPLASTY Left 12/08/2017   Procedure: LEFT TOTAL HIP ARTHROPLASTY ANTERIOR APPROACH;  Surgeon: Priscilla Cancel, MD;  Location: [facility];  Service: Orthopedics;  Laterality: Left;  70 mins  . TRANSTHORACIC ECHOCARDIOGRAM  08/2014   EF  60-65%, grade I diast dysfxn  . TUBAL LIGATION  1974    Home Medications:  Allergies as of 11/25/2019      Reactions   Losartan Other (See Comments)   hyperkalemia   Penicillins Hives, Swelling, Rash   Has patient had a PCN reaction causing immediate rash, facial/tongue/throat swelling, SOB or lightheadedness with hypotension: YES Has patient had a PCN reaction causing severe rash involving mucus membranes or skin necrosis: NO Has patient had a PCN reaction that required hospitalization NO Has patient had a PCN reaction occurring within the last 10 years: NO If all of the above answers are "NO", then may proceed with Cephalosporin use.   Cefixime [kdc:cefixime] Other (See Comments)   unspecified   Gabapentin Other (See Comments)   Feels woozy   Indocin [indomethacin] Other (See Comments)   Painful tongue and throat   Singulair [montelukast Sodium]    Chest tightness, decreased mental clarity  Sulfa Antibiotics Other (See Comments)   unspecified   Ace Inhibitors Other (See Comments)   cough   Statins Other (See Comments)   Myalgias      Medication List       Accurate as of November 25, 2019  8:59 AM. If you have any questions, ask your nurse or doctor.        albuterol 108 (90 Base) MCG/ACT inhaler Commonly known as: ProAir HFA Inhale 2 puffs into the lungs every 4 (four) hours as needed for wheezing or shortness of breath.   alendronate 70 MG tablet Commonly known as: FOSAMAX Take 1 tablet (70 mg total) by mouth once a week. Take with a full glass of water on an empty stomach.   amLODipine 5 MG tablet Commonly known as: NORVASC TAKE 1 TABLET(5 MG) BY MOUTH DAILY   aspirin EC 81 MG tablet Take 81 mg by mouth daily.   azelastine 0.1 % nasal spray Commonly known as: ASTELIN Place 2 sprays into Cantrell nostrils 2 (two) times daily. Use in each nostril as directed   clobetasol 0.05 % external solution Commonly known as: TEMOVATE APPLY TO THE AFFECTED AREA EVERY  DAY   diazepam 5 MG tablet Commonly known as: VALIUM TAKE 1 TABLET BY MOUTH EVERY NIGHT AT BEDTIME AND MAY TAKE AN ADDITIONAL TABLET AS NEEDED FOR ANXIETY   fluorometholone 0.1 % ophthalmic suspension Commonly known as: FML Place 1 drop into Cantrell eyes as directed. Tapering off   furosemide 20 MG tablet Commonly known as: LASIX 1 tab po qd IF NEEDED for increased fluid collection in legs   mirabegron ER 25 MG Tb24 tablet Commonly known as: MYRBETRIQ Take 1 tablet (25 mg total) by mouth daily.   PROBIOTIC DAILY PO Take 1 capsule by mouth daily.   roflumilast 500 MCG Tabs tablet Commonly known as: DALIRESP Take 1 tablet (500 mcg total) by mouth daily.   Soothe XP Soln Place 1 drop into Cantrell eyes 2 (two) times daily as needed (irritation).   Synthroid 88 MCG tablet Generic drug: levothyroxine Take 1 tablet (88 mcg total) by mouth daily.   Trelegy Ellipta 100-62.5-25 MCG/INH Aepb Generic drug: Fluticasone-Umeclidin-Vilant Inhale 1 puff into the lungs daily.   triamcinolone cream 0.1 % Commonly known as: KENALOG Apply 1 application topically 2 (two) times daily.   venlafaxine 50 MG tablet Commonly known as: EFFEXOR TAKE 1 TABLET BY MOUTH TWICE DAILY   vitamin B-12 100 MCG tablet Commonly known as: CYANOCOBALAMIN Take 100 mcg by mouth daily.       Allergies:  Allergies  Allergen Reactions  . Losartan Other (See Comments)    hyperkalemia  . Penicillins Hives, Swelling and Rash    Has patient had a PCN reaction causing immediate rash, facial/tongue/throat swelling, SOB or lightheadedness with hypotension: YES Has patient had a PCN reaction causing severe rash involving mucus membranes or skin necrosis: NO Has patient had a PCN reaction that required hospitalization NO Has patient had a PCN reaction occurring within the last 10 years: NO If all of the above answers are "NO", then may proceed with Cephalosporin use.   . Cefixime [Kdc:Cefixime] Other (See Comments)     unspecified  . Gabapentin Other (See Comments)    Feels woozy  . Indocin [Indomethacin] Other (See Comments)    Painful tongue and throat  . Singulair [Montelukast Sodium]     Chest tightness, decreased mental clarity  . Sulfa Antibiotics Other (See Comments)    unspecified  . Ace  Inhibitors Other (See Comments)    cough  . Statins Other (See Comments)    Myalgias     Family History: Family History  Problem Relation Age of Onset  . Goiter Mother   . Psoriasis Father        od liver  . Cirrhosis Father   . Diabetes Daughter        Type 2  . Stroke Maternal Grandfather   . Stroke Paternal Grandmother   . Hypertension Paternal Grandmother   . Hernia Paternal Grandmother   . Cancer Paternal Grandfather        lung  . Cancer Maternal Aunt        cancer of the stomach    Social History:  reports that she quit smoking about 29 years ago. Her smoking use included cigarettes. She started smoking about 63 years ago. She has a 51.00 pack-year smoking history. She has never used smokeless tobacco. She reports current alcohol use. She reports that she does not use drugs.  ROS: All other review of systems were reviewed and are negative except what is noted above in HPI  Physical Exam: BP 135/67   Pulse 85   Temp (!) 97 F (36.1 C)   Ht 5' (1.524 m)   Wt 135 lb (61.2 kg)   BMI 26.37 kg/m   Constitutional:  Alert and oriented, No acute distress. HEENT: [place] AT, moist mucus membranes.  Trachea midline, no masses. Cardiovascular: No clubbing, cyanosis, or edema. Respiratory: Normal respiratory effort, no increased work of breathing. GI: Abdomen is soft, nontender, nondistended, no abdominal masses GU: No CVA tenderness.  Lymph: No cervical or inguinal lymphadenopathy. Skin: No rashes, bruises or suspicious lesions. Neurologic: Grossly intact, no focal deficits, moving all 4 extremities. Psychiatric: Normal mood and affect.  Laboratory Data: Lab Results  Component Value  Date   WBC 8.9 05/26/2018   HGB 13.7 05/26/2018   HCT 42.0 05/26/2018   MCV 86.9 05/26/2018   PLT 269.0 05/26/2018    Lab Results  Component Value Date   CREATININE 0.76 05/11/2019    No results found for: PSA  No results found for: TESTOSTERONE  Lab Results  Component Value Date   HGBA1C 5.7 10/29/2011    Urinalysis    Component Value Date/Time   COLORURINE YELLOW 06/14/2015 1452   APPEARANCEUR Clear 11/03/2019 1125   LABSPEC 1.020 06/14/2015 1452   PHURINE 6.5 06/14/2015 1452   GLUCOSEU Negative 11/03/2019 1125   GLUCOSEU NEGATIVE 06/14/2015 1452   HGBUR LARGE (A) 06/14/2015 1452   BILIRUBINUR Negative 11/03/2019 1125   KETONESUR NEGATIVE 06/14/2015 1452   PROTEINUR Negative 11/03/2019 1125   PROTEINUR 100 (A) 02/20/2015 1020   UROBILINOGEN 0.2 09/01/2018 1147   UROBILINOGEN 0.2 06/14/2015 1452   NITRITE Negative 11/03/2019 1125   NITRITE NEGATIVE 06/14/2015 1452   LEUKOCYTESUR 1+ (A) 11/03/2019 1125    Lab Results  Component Value Date   LABMICR See below: 11/03/2019   WBCUA 6-10 (A) 11/03/2019   LABEPIT >10 (A) 11/03/2019   BACTERIA Few (A) 11/03/2019    Pertinent Imaging:  Results for orders placed during the hospital encounter of 06/06/15  DG Abd 1 View  Narrative CLINICAL DATA:  Anal leakage that she states is not diarhhea x 2 weeks. Pt denies pain or nausea or vomiting. She states she had shingles a couple of months ago but finished up her antibiotics x 1 month ago.  EXAM: ABDOMEN - 1 VIEW  COMPARISON:  None.  FINDINGS: There is  no bowel dilatation to suggest obstruction. There is no evidence of pneumoperitoneum, portal venous gas or pneumatosis. There are no pathologic calcifications along the expected course of the ureters. There is a right total hip arthroplasty. There is moderate osteoarthritis of the left hip. There is lumbar spine spondylosis.  IMPRESSION: Unremarkable KUB.   Electronically Signed By: Kathreen Devoid On:  06/06/2015 16:07  Results for orders placed during the hospital encounter of 09/02/17  US Venous Img Lower Bilateral  Narrative CLINICAL DATA:  Bilateral lower extremity pain and edema. History of chronic mild venous insufficiency. History of varicose veins. Evaluate for DVT.  EXAM: BILATERAL LOWER EXTREMITY VENOUS DOPPLER ULTRASOUND  TECHNIQUE: Gray-scale sonography with graded compression, as well as color Doppler and duplex ultrasound were performed to evaluate the lower extremity deep venous systems from the level of the common femoral vein and including the common femoral, femoral, profunda femoral, popliteal and calf veins including the posterior tibial, peroneal and gastrocnemius veins when visible. The superficial great saphenous vein was also interrogated. Spectral Doppler was utilized to evaluate flow at rest and with distal augmentation maneuvers in the common femoral, femoral and popliteal veins.  COMPARISON:  None.  FINDINGS: RIGHT LOWER EXTREMITY  Common Femoral Vein: No evidence of thrombus. Normal compressibility, respiratory phasicity and response to augmentation.  Saphenofemoral Junction: No evidence of thrombus. Normal compressibility and flow on color Doppler imaging.  Profunda Femoral Vein: No evidence of thrombus. Normal compressibility and flow on color Doppler imaging.  Femoral Vein: No evidence of thrombus. Normal compressibility, respiratory phasicity and response to augmentation.  Popliteal Vein: No evidence of thrombus. Normal compressibility, respiratory phasicity and response to augmentation.  Calf Veins: No evidence of thrombus. Normal compressibility and flow on color Doppler imaging.  Superficial Great Saphenous Vein: No evidence of thrombus. Normal compressibility.  Venous Reflux:  None.  Other Findings: There is an approximately 3.8 x 1.6 x 3.3 cm anechoic serpiginous fluid collection with the right popliteal fossa compatible  with a Baker cyst.  LEFT LOWER EXTREMITY  Common Femoral Vein: No evidence of thrombus. Normal compressibility, respiratory phasicity and response to augmentation.  Saphenofemoral Junction: No evidence of thrombus. Normal compressibility and flow on color Doppler imaging.  Profunda Femoral Vein: No evidence of thrombus. Normal compressibility and flow on color Doppler imaging.  Femoral Vein: No evidence of thrombus. Normal compressibility, respiratory phasicity and response to augmentation.  Popliteal Vein: No evidence of thrombus. Normal compressibility, respiratory phasicity and response to augmentation.  Calf Veins: No evidence of thrombus. Normal compressibility and flow on color Doppler imaging.  Superficial Great Saphenous Vein: No evidence of thrombus. Normal compressibility.  Venous Reflux:  None.  Other Findings:  None.  IMPRESSION: 1. No evidence of DVT within either lower extremity. 2. Incidentally noted 3.8 cm right-sided Baker's cyst.   Electronically Signed By: Sandi Mariscal M.D. On: 09/02/2017 15:46  No results found for this or any previous visit.  No results found for this or any previous visit.  No results found for this or any previous visit.  No results found for this or any previous visit.  No results found for this or any previous visit.  No results found for this or any previous visit.   Assessment & Plan:    1. Chronic cystitis with hematuria -Urine for culture, will call with results -Macrobid 100mg  BID for 7 days -We will start macrobid 50mg  qhs prophylaxis - Urinalysis, Routine w reflex microscopic - BLADDER SCAN AMB NON-IMAGING  2. Urinary urgency -  Mirabegron 25mg  daily   No follow-ups on file.  Nicolette Bang, MD  [facility] [place]

## 2019-11-25 NOTE — Progress Notes (Signed)
PVR=13ml   Urological Symptom Review  Patient is experiencing the following symptoms: Frequent urination Burning/pain with urination Leakage of urine Stream starts and stops Urinary tract infection   Review of Systems  Gastrointestinal (upper)  : Negative for upper GI symptoms  Gastrointestinal (lower) : Negative for lower GI symptoms  Constitutional : Negative for symptoms  Skin: Negative for skin symptoms  Eyes: Negative for eye symptoms  Ear/Nose/Throat : Negative for Ear/Nose/Throat symptoms  Hematologic/Lymphatic: Negative for Hematologic/Lymphatic symptoms  Cardiovascular : Negative for cardiovascular symptoms  Respiratory : Negative for respiratory symptoms  Endocrine: Negative for endocrine symptoms  Musculoskeletal: Negative for musculoskeletal symptoms  Neurological: Negative for neurological symptoms  Psychologic: Negative for psychiatric symptoms

## 2019-11-25 NOTE — Patient Instructions (Signed)
Urinary Tract Infection, Adult A urinary tract infection (UTI) is an infection of any part of the urinary tract. The urinary tract includes:  The kidneys.  The ureters.  The bladder.  The urethra. These organs make, store, and get rid of pee (urine) in the body. What are the causes? This is caused by germs (bacteria) in your genital area. These germs grow and cause swelling (inflammation) of your urinary tract. What increases the risk? You are more likely to develop this condition if:  You have a small, thin tube (catheter) to drain pee.  You cannot control when you pee or poop (incontinence).  You are female, and: ? You use these methods to prevent pregnancy:  A medicine that kills sperm (spermicide).  A device that blocks sperm (diaphragm). ? You have low levels of a female hormone (estrogen). ? You are pregnant.  You have genes that add to your risk.  You are sexually active.  You take antibiotic medicines.  You have trouble peeing because of: ? A prostate that is bigger than normal, if you are female. ? A blockage in the part of your body that drains pee from the bladder (urethra). ? A kidney stone. ? A nerve condition that affects your bladder (neurogenic bladder). ? Not getting enough to drink. ? Not peeing often enough.  You have other conditions, such as: ? Diabetes. ? A weak disease-fighting system (immune system). ? Sickle cell disease. ? Gout. ? Injury of the spine. What are the signs or symptoms? Symptoms of this condition include:  Needing to pee right away (urgently).  Peeing often.  Peeing small amounts often.  Pain or burning when peeing.  Blood in the pee.  Pee that smells bad or not like normal.  Trouble peeing.  Pee that is cloudy.  Fluid coming from the vagina, if you are female.  Pain in the belly or lower back. Other symptoms include:  Throwing up (vomiting).  No urge to eat.  Feeling mixed up (confused).  Being tired  and grouchy (irritable).  A fever.  Watery poop (diarrhea). How is this treated? This condition may be treated with:  Antibiotic medicine.  Other medicines.  Drinking enough water. Follow these instructions at home:  Medicines  Take over-the-counter and prescription medicines only as told by your doctor.  If you were prescribed an antibiotic medicine, take it as told by your doctor. Do not stop taking it even if you start to feel better. General instructions  Make sure you: ? Pee until your bladder is empty. ? Do not hold pee for a long time. ? Empty your bladder after sex. ? Wipe from front to back after pooping if you are a female. Use each tissue one time when you wipe.  Drink enough fluid to keep your pee pale yellow.  Keep all follow-up visits as told by your doctor. This is important. Contact a doctor if:  You do not get better after 1-2 days.  Your symptoms go away and then come back. Get help right away if:  You have very bad back pain.  You have very bad pain in your lower belly.  You have a fever.  You are sick to your stomach (nauseous).  You are throwing up. Summary  A urinary tract infection (UTI) is an infection of any part of the urinary tract.  This condition is caused by germs in your genital area.  There are many risk factors for a UTI. These include having a small, thin   tube to drain pee and not being able to control when you pee or poop.  Treatment includes antibiotic medicines for germs.  Drink enough fluid to keep your pee pale yellow. This information is not intended to replace advice given to you by your health care provider. Make sure you discuss any questions you have with your health care provider. Document Revised: 01/07/2018 Document Reviewed: 07/30/2017 Elsevier Patient Education  2020 [employer].  

## 2019-11-28 LAB — MICROSCOPIC EXAMINATION
RBC, Urine: NONE SEEN /hpf (ref 0–2)
Renal Epithel, UA: NONE SEEN /hpf

## 2019-11-28 LAB — URINALYSIS, ROUTINE W REFLEX MICROSCOPIC
Bilirubin, UA: NEGATIVE
Glucose, UA: NEGATIVE
Ketones, UA: NEGATIVE
Nitrite, UA: NEGATIVE
Protein,UA: NEGATIVE
Specific Gravity, UA: 1.025 (ref 1.005–1.030)
Urobilinogen, Ur: 0.2 mg/dL (ref 0.2–1.0)
pH, UA: 5.5 (ref 5.0–7.5)

## 2019-11-29 ENCOUNTER — Other Ambulatory Visit: Payer: Self-pay | Admitting: Pulmonary Disease

## 2019-11-29 DIAGNOSIS — J449 Chronic obstructive pulmonary disease, unspecified: Secondary | ICD-10-CM

## 2019-11-29 DIAGNOSIS — J9611 Chronic respiratory failure with hypoxia: Secondary | ICD-10-CM

## 2019-11-30 LAB — URINE CULTURE

## 2019-12-01 ENCOUNTER — Telehealth: Payer: Self-pay

## 2019-12-01 MED ORDER — TRIMETHOPRIM 100 MG PO TABS: 100.0000 mg | ORAL_TABLET | Freq: Every day | ORAL | 11 refills | Status: AC

## 2019-12-01 NOTE — Telephone Encounter (Signed)
Pt called saying she had taken Macrobid that Dr. Alyson Ingles had given her at last ov. She has taken it for 6 days she said and has felt very dizzy and "drunk" since. I talked with Dr. Alyson Ingles and he switched her to try Trimethoprim. Pt called and told to take new rx and stop the Macrobid.

## 2019-12-05 ENCOUNTER — Telehealth: Payer: Self-pay | Admitting: Pulmonary Disease

## 2019-12-05 NOTE — Telephone Encounter (Signed)
Spoke with pt. C/o increased SOB x 3-4 weeks - Worse on exertion Some nasal drainage - using saline rinses Denies cough, wheezing Pt given ov with Wyn Quaker, NP 12/08/19.

## 2019-12-05 NOTE — Telephone Encounter (Signed)
Called pt but unable to reach. Left message for her to return call. 

## 2019-12-05 NOTE — Telephone Encounter (Signed)
Pt returning a phone call. Pt can be reached at [phone]

## 2019-12-07 ENCOUNTER — Telehealth: Payer: Self-pay | Admitting: Family Medicine

## 2019-12-07 NOTE — Telephone Encounter (Signed)
Spoke with patient she stated she will be out of town this month and to call her back in December 2021

## 2019-12-08 ENCOUNTER — Ambulatory Visit (INDEPENDENT_AMBULATORY_CARE_PROVIDER_SITE_OTHER): Payer: Medicare Other | Admitting: Pulmonary Disease

## 2019-12-08 ENCOUNTER — Encounter: Payer: Self-pay | Admitting: Pulmonary Disease

## 2019-12-08 ENCOUNTER — Other Ambulatory Visit: Payer: Self-pay

## 2019-12-08 VITALS — BP 130/70 | HR 81 | Temp 97.5°F | Ht 60.0 in | Wt 131.4 lb

## 2019-12-08 DIAGNOSIS — M7989 Other specified soft tissue disorders: Secondary | ICD-10-CM | POA: Diagnosis not present

## 2019-12-08 DIAGNOSIS — J9611 Chronic respiratory failure with hypoxia: Secondary | ICD-10-CM

## 2019-12-08 DIAGNOSIS — J449 Chronic obstructive pulmonary disease, unspecified: Secondary | ICD-10-CM | POA: Diagnosis not present

## 2019-12-08 DIAGNOSIS — R0602 Shortness of breath: Secondary | ICD-10-CM

## 2019-12-08 DIAGNOSIS — Z0000 Encounter for general adult medical examination without abnormal findings: Secondary | ICD-10-CM

## 2019-12-08 DIAGNOSIS — J309 Allergic rhinitis, unspecified: Secondary | ICD-10-CM

## 2019-12-08 DIAGNOSIS — Z638 Other specified problems related to primary support group: Secondary | ICD-10-CM

## 2019-12-08 LAB — COMPREHENSIVE METABOLIC PANEL
ALT: 9 U/L (ref 0–35)
AST: 13 U/L (ref 0–37)
Albumin: 4.1 g/dL (ref 3.5–5.2)
Alkaline Phosphatase: 87 U/L (ref 39–117)
BUN: 15 mg/dL (ref 6–23)
CO2: 28 mEq/L (ref 19–32)
Calcium: 9.6 mg/dL (ref 8.4–10.5)
Chloride: 107 mEq/L (ref 96–112)
Creatinine, Ser: 1.06 mg/dL (ref 0.40–1.20)
GFR: 48.89 mL/min — ABNORMAL LOW (ref >60.00)
Glucose, Bld: 84 mg/dL (ref 70–99)
Potassium: 3.9 mEq/L (ref 3.5–5.1)
Sodium: 142 mEq/L (ref 135–145)
Total Bilirubin: 0.5 mg/dL (ref 0.2–1.2)
Total Protein: 6.7 g/dL (ref 6.0–8.3)

## 2019-12-08 NOTE — Patient Instructions (Addendum)
You were seen today by Lauraine Rinne, NP  for:   It was nice seeing you today.  I do believe it is important that you speak to your daughter/family regarding your friend coming to visit.  Nothing is important that your family is aware that this is happening.  I also believe will be helpful that if you continue to have this amount of emotional stress and burden that you discuss this with your PCP so he can appropriately support you.  Please let us know if you have any worsened symptoms or issues with your breathing.  Great seeing you, stay safe!  Aaron Edelman  1. Swelling of lower extremity 2. Shortness of breath  - Comp Met (CMET); Future  Lab work today  I am concerned about your lower extremity swelling  Please take the Lasix as prescribed by primary care  3. COPD with chronic bronchitis ([place])  Trelegy Ellipta  >>> 1 puff daily in the morning >>>rinse mouth out after use  >>> This inhaler contains 3 medications that help manage her respiratory status, contact our office if you cannot afford this medication or unable to remain on this medication  Only use your albuterol as a rescue medication to be used if you can't catch your breath by resting or doing a relaxed purse lip breathing pattern.  - The less you use it, the better it will work when you need it. - Ok to use up to 2 puffs  every 4 hours if you must but call for immediate appointment if use goes up over your usual need - Don't leave home without it !!  (think of it like the spare tire for your car)   Note your daily symptoms > remember "red flags" for COPD:   >>>Increase in cough >>>increase in sputum production >>>increase in shortness of breath or activity  intolerance.   If you notice these symptoms, please call the office to be seen.   4. Chronic hypoxemic respiratory failure (HCC)  Continue oxygen therapy as prescribed  >>>maintain oxygen saturations greater than 88 percent  >>>if unable to maintain oxygen  saturations please contact the office  >>>do not smoke with oxygen  >>>can use nasal saline gel or nasal saline rinses to moisturize nose if oxygen causes dryness   5. Allergic rhinitis, unspecified seasonality, unspecified trigger  Please start taking a daily antihistamine:  >>>choose one of: zyrtec, claritin, allegra, or xyzal  >>>these are over the counter medications  >>>can choose generic option  >>>take daily  >>>this medication helps with allergies, post nasal drip, and cough   6. Healthcare maintenance  We would recommend that you obtain your COVID-19 booster  It is okay for you to receive the Pfizer vaccine as a booster even though you received Moderna   We recommend today:  Orders Placed This Encounter  Procedures  . Comp Met (CMET)    Standing Status:   Future    Standing Expiration Date:   12/07/2020   Orders Placed This Encounter  Procedures  . Comp Met (CMET)   No orders of the defined types were placed in this encounter.   Follow Up:    Return in about 2 months (around 02/07/2020), or if symptoms worsen or fail to improve, for Follow up with [name], Follow up with Wyn Quaker FNP-C.   Notification of test results are managed in the following manner: If there are  any recommendations or changes to the  plan of care discussed in office today,  we will contact you and let you know what they are. If you do not hear from [place], then your results are normal and you can view them through your  MyChart account , or a letter will be sent to you. Thank you again for trusting [place] with your care  - Thank you, [facility]    It is flu season:   >>> Best ways to protect herself from the flu: Receive the yearly flu vaccine, practice good hand hygiene washing with soap and also using hand sanitizer when available, eat a nutritious meals, get adequate rest, hydrate appropriately       Please contact the office if your symptoms worsen or you have concerns that you  are not improving.   Thank you for choosing  [facility] for your healthcare, and for allowing [place] to partner with you on your healthcare journey. I am thankful to be able to provide care to you today.   Wyn Quaker FNP-C

## 2019-12-08 NOTE — Assessment & Plan Note (Signed)
Intolerant of Singulair Significant postnasal drip  Plan: Restart daily antihistamine Continue Astelin nasal spray

## 2019-12-08 NOTE — Progress Notes (Signed)
@Patient  ID: Priscilla Cantrell, female    DOB: Sep 19, 1935, 84 y.o.   MRN: [id number]  Chief Complaint  Patient presents with  . Follow-up    COPD, SOB for 4 weeks    Referring provider: [name], MD  HPI:  84 year old female former smoker followed in our office for GOLD III COPD (based on: Spirometry 05/14/17 >> FEV1 0.91 (55%), FEV1% 48), nocturnal hypoxemia  PMH: GERD, hip pain, osteoarthritis Smoker/ Smoking History: Former smoker.  Quit in 1992.  70 pack years. Maintenance: Trelegy Ellipta, Daliresp  [name]  12/08/2019  - Visit   84 year old female former smoker followed in our office for COPD.  Presenting to her office today because she reports she is had worsened shortness of breath over the last 4 weeks.  She is unsure if this is due to COPD or if it is due to the emotional stress that she is currently under going with her family.  She denies any wheezing, cough or congestion.  She remains adherent to Trelegy Ellipta as well as Daliresp.  She is not using her rescue inhaler more than usual.  She denies any fevers.  She has not been taking her as needed Lasix as she did not feel that she needed this.  Lower extremities are more swollen today.  We will discuss this.  Patient reports that there is added emotional stress as she has a online friend who she has been talking to for 2 years.  She has a significant emotional attachment to him.  She reports that this is strained with her daughter/son/grandson.  This is added significant emotional stress.  Apparently the online friend will be moving to [place].  Her family is unaware of this.  This will be happening within the next week.  This is been weighing on her.  She also has at one point in time sent this friend money.  She reports that she was not coerced into doing this and that she freely wanted to do this.    Questionaires / Pulmonary Flowsheets:   ACT:  No flowsheet data found.  MMRC: mMRC Dyspnea Scale  mMRC Score  12/08/2019 1    [place]:  No flowsheet data found.  Tests:   Pulmonary tests:  Spirometry 01/10/09>>FEV1 0.97(52%), FEV1% 47 March 2012>>Daliresp started RAST 05/04/17 >> negative, IgE 8 Spirometry 05/14/17 >> FEV1 0.91 (55%), FEV1% 48  Sleep tests:  PSG 11/14/11 >> AHI 0.3 ONO with RA 07/31/16 >>test time 6 hrs 41 min. Average SpO2 87%, low SpO2 74%. Spent 4 hrs 13 min with SpO2 <88%.  Cardiac tests:  Echo 08/30/14 >> EF 60 to 45%, grade 1 diastolic dysfx, PAS 24 mmHG   FENO:  No results found for: NITRICOXIDE  PFT: PFT Results Latest Ref Rng & Units 10/29/2018  FVC-Pre L 2.01  FVC-Predicted Pre % 96  FVC-Post L 2.03  FVC-Predicted Post % 97  Pre FEV1/FVC % % 52  Post FEV1/FCV % % 52  FEV1-Pre L 1.04  FEV1-Predicted Pre % 67  FEV1-Post L 1.05  DLCO uncorrected ml/min/mmHg 7.25  DLCO UNC% % 44  DLVA Predicted % 51  TLC L 4.29  TLC % Predicted % 96  RV % Predicted % 76    WALK:  SIX MIN WALK 07/29/2018 07/29/2016 02/08/2015 11/17/2014  Supplimental Oxygen during Test? (L/min) - No No No  Tech Comments: Pt walked at a good pace. Pt dropped at 2nd lap and more on 3rd lap. O2 appilied after 3rd  lap, walked 1 lap at 2 liters, maintained O2 sat above 92. pt walked at a slow pace - no desat - no c/o SOB, wheezing or chest tx--amg At end of 1st lap, o2 sat 88% on RA with pulse of 96.  pt placed on 2lpm continuous o2 with resting o2 sat of 98% and pulse of 76.  After ambulating 1 lap on 2lpm cont o2, o2 sat 91% with pulse of 97.  Lowest o2 sat during this lap was 91% on 2lpm.  Walk stopped after 1 lap on o2 per pt's request d/t weakness from recent hip surgery.   pt unable to complete test d/t recent hip replacement    Imaging: No results found.  Lab Results:  CBC    Component Value Date/Time   WBC 8.9 05/26/2018 0952   RBC 4.83 05/26/2018 0952   HGB 13.7 05/26/2018 0952   HCT 42.0 05/26/2018 0952   PLT 269.0 05/26/2018 0952   MCV 86.9 05/26/2018 0952    MCH 28.4 12/10/2017 0559   MCHC 32.7 05/26/2018 0952   RDW 16.0 (H) 05/26/2018 0952   LYMPHSABS 2.0 09/02/2017 1057   MONOABS 0.4 09/02/2017 1057   EOSABS 0.1 09/02/2017 1057   BASOSABS 0.0 09/02/2017 1057    BMET    Component Value Date/Time   NA 141 05/11/2019 1331   K 4.0 05/11/2019 1331   CL 106 05/11/2019 1331   CO2 26 05/11/2019 1331   GLUCOSE 84 05/11/2019 1331   BUN 16 05/11/2019 1331   CREATININE 0.76 05/11/2019 1331   CREATININE 0.84 02/27/2016 1533   CALCIUM 9.1 05/11/2019 1331   GFRNONAA >60 12/10/2017 0559   GFRAA >60 12/10/2017 0559    BNP    Component Value Date/Time   BNP 78.4 01/24/2015 1217    ProBNP No results found for: PROBNP  Specialty Problems      Pulmonary Problems   Allergic rhinitis    Qualifier: Diagnosis of  By: [name] MD, [name]        COPD GOLD III B    Spirometry 01/10/09>>FEV1 0.97(52%), FEV1% 47 March 2012>>Daliresp started RAST 05/04/17 >> negative, IgE 8 Spirometry 05/14/17 >> FEV1 0.91 (55%), FEV1% 48       Hypoxia   SOB (shortness of breath)   Chronic hypoxemic respiratory failure (HCC)    ONO with RA 07/31/16 >> test time 6 hrs 41 min.  Average SpO2 87%, low SpO2 74%.  Spent 4 hrs 13 min with SpO2 < 88%.      Shortness of breath      Allergies  Allergen Reactions  . Losartan Other (See Comments)    hyperkalemia  . Penicillins Hives, Swelling and Rash    Has patient had a PCN reaction causing immediate rash, facial/tongue/throat swelling, SOB or lightheadedness with hypotension: YES Has patient had a PCN reaction causing severe rash involving mucus membranes or skin necrosis: NO Has patient had a PCN reaction that required hospitalization NO Has patient had a PCN reaction occurring within the last 10 years: NO If all of the above answers are "NO", then may proceed with Cephalosporin use.   . Cefixime [Kdc:Cefixime] Other (See Comments)    unspecified  . Gabapentin Other (See Comments)    Feels woozy  . Indocin  [Indomethacin] Other (See Comments)    Painful tongue and throat  . Singulair [Montelukast Sodium]     Chest tightness, decreased mental clarity  . Sulfa Antibiotics Other (See Comments)    unspecified  . Ace Inhibitors  Other (See Comments)    cough  . Statins Other (See Comments)    Myalgias     Immunization History  Administered Date(s) Administered  . Fluad Quad(high Dose 65+) 10/05/2018, 10/25/2019  . Influenza Split 11/04/2010, 10/23/2011  . Influenza Whole 10/04/2009  . Influenza, High Dose Seasonal PF 11/07/2016, 11/04/2017  . Influenza,inj,Quad PF,6+ Mos 10/07/2012, 10/11/2014  . Influenza-Unspecified 11/07/2013, 11/01/2015  . Moderna SARS-COVID-2 Vaccination 02/14/2019, 03/15/2019  . Pneumococcal Conjugate-13 04/29/2013  . Pneumococcal Polysaccharide-23 02/03/2005, 11/28/2010, 11/01/2018  . Td 02/04/2008, 03/07/2010  . Zoster 02/10/2013  . Zoster Recombinat (Shingrix) 10/30/2016, 12/29/2016    Past Medical History:  Diagnosis Date  . Allergic rhinitis    with upper airway cough: as of 07/2017 pulm f/u she was instructed to use astelin, flonase, and saline more consistently  . BPPV (benign paroxysmal positional vertigo) 03/2018  . Chest pain, non-cardiac    Cardiac CT showed no signif obstructive dz, mildly elevated calcium level (Dr. [name]).  . Chronic cystitis with hematuria    [name]  . Chronic hypoxemic respiratory failure ([place]) 02/02/2015   2L oxygen 24/7  . Cold hands    NCS/EMGs normal.  Hand dysesthesias per neuro--reassured  . COPD (chronic obstructive pulmonary disease) (HCC)    GOLD 4.  spirometry 01/10/09 FEV 0.97(52%), FEV1% 47.  With chronic bronchitis as of 07/2017.  . DDD (degenerative disc disease), cervical    1998 MRI C5-6 impingemt (left).  MRI 2021 (neurol) 1 level canal stenosis, 1 level foraminal stenosis--->PT  . DDD (degenerative disc disease), lumbar   . Depression with anxiety 04/15/2011  . Diarrheal disease Summer 2017    ? 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).  As of 10/05/15 pt reports GI dx'd her with C diff and rx'd more flagyl and she is also on cholestyramine.  As of 02/2016, pt's sx's resolved completely.  . Diverticulosis of colon    Procto '97 and colonoscopy 2002  . Dysplastic nevus of upper extremity 04/2014   R tricep ([name])  . Edema of both lower extremities due to peripheral venous insufficiency    Varicosities bilat, swelling L>R.  R baker's cyst.  Got vein clinic eval 02/2018->sclerotherapy recommended but since no hemorrhage or ulceration, insurance will not cover the procedure.  . Family history of adverse reaction to anesthesia    mother died when patient age 29 receiving anesthesia for thyroid surgery  . Fibrocystic breast disease    w/fibroadenoma.  Bx's showed NO atypia (Dr. Margot Chimes).  Mammo neg 2008.  Marland Kitchen GERD (gastroesophageal reflux disease)   . History of double vision    Ophthalmologist, Dr. Herbert Deaner, is further evaluating this with MRI  orbits and limited brain (myesthenia gravis testing neg 07/2010)  . History of home oxygen therapy    uses 2 liters at hs  . Hypertension    EKG 03/2010 normal  . Hypothyroidism    Hashimoto's, dx'd 1989  . Idiopathic peripheral neuropathy 04/2018   Sensory (numb feet) S1 distribution bilat, c/w spinal stenosis sensory neuropathy (no pain). 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  Labs [place], MRI C spine->some spondylosis/stenosis.  UE NCS/EMS: normal 04/2019.  Marland Kitchen Lesion of right native kidney 2017   found on CT 02/2015.  F/u MRI 03/31/15 showed it to be smaller and less likely of any significance but repeat CT renal protocol in 72mo was recommended by radiology.  This was  done 10/26/15 and showed no interval change in the lesion, but b/c the lesion enhances with contrast, radiol rec'd urol  consult (b/c pap renal RCC could not be excluded).  Saw urol 03/06/16--stable on u/s 09/2016 and 09/2017.  . Osteoarthritis, hip, bilateral    she is s/p bilat THA as of 02/2018.  . Osteoporosis    radius, T score -2.5->rec'd alendronate 08/2019.  Marland Kitchen Pneumonia   . Postmenopausal atrophic vaginitis    improved with estrace 2 X/week.  . Prolapse of vaginal cuff after hysterectomy    10/2018: Dr. Wayne Both. Mild colpocele 04/2019 per GYN (no surgery)  . Pulmonary nodule    CT chest 12/10, June 2011.  No nodule on CT 06/2010.  Marland Kitchen Recurrent Clostridium difficile diarrhea    Episode 09/19/2016: resolved with prolonged course of flagyl.  11/2016 recurrence treated with 10d of Dificid (Fidaxomicin) by GI (Dr. Earlean Shawl).  . Recurrent UTI    likely related to pt's atrophic vaginitis.  Urol started vaginal estrace 03/2016.  . Rib fracture 09/2018   R 7th, laterally-->sustained when her dog pulled her over and she fell.  Marland Kitchen RLS (restless legs syndrome)    neuro->gabapentin trial 2020/21  . Shingles 02/2014   R abd/flank  . Spinal stenosis of lumbar region with neurogenic claudication    2008 MRI (L3-4, L4-5, L5-S1).  +S1 distribution sensory loss bilat    Tobacco History: Social History   Tobacco Use  Smoking Status Former Smoker  . Packs/day: 1.50  . Years: 34.00  . Pack years: 51.00  . Types: Cigarettes  . Start date: 65  . Quit date: 02/03/1990  . Years since quitting: 29.8  Smokeless Tobacco Never Used   Counseling given: Not Answered   Continue to not smoke  Outpatient Encounter Medications as of 12/08/2019  Medication Sig  . albuterol (PROAIR HFA) 108 (90 Base) MCG/ACT inhaler Inhale 2 puffs into the lungs every 4 (four) hours as needed for wheezing or shortness of breath.  Marland Kitchen alendronate (FOSAMAX) 70 MG tablet Take 1 tablet (70 mg total) by mouth once a week. Take with a full glass of water on an empty stomach.  Marland Kitchen amLODipine (NORVASC) 5 MG tablet TAKE 1 TABLET(5 MG) BY MOUTH DAILY  .  Artificial Tear Solution (SOOTHE XP) SOLN Place 1 drop into both eyes 2 (two) times daily as needed (irritation).  Marland Kitchen aspirin EC 81 MG tablet Take 81 mg by mouth daily.  Marland Kitchen azelastine (ASTELIN) 0.1 % nasal spray Place 2 sprays into both nostrils 2 (two) times daily. Use in each nostril as directed  . clobetasol (TEMOVATE) 0.05 % external solution APPLY TO THE AFFECTED AREA EVERY DAY  . diazepam (VALIUM) 5 MG tablet TAKE 1 TABLET BY MOUTH EVERY NIGHT AT BEDTIME AND MAY TAKE AN ADDITIONAL TABLET AS NEEDED FOR ANXIETY  . fluorometholone (FML) 0.1 % ophthalmic suspension Place 1 drop into both eyes as directed. Tapering off  . Fluticasone-Umeclidin-Vilant (TRELEGY ELLIPTA) 100-62.5-25 MCG/INH AEPB Inhale 1 puff into the lungs daily.  . furosemide (LASIX) 20 MG tablet 1 tab po qd IF NEEDED for increased fluid collection in legs  . mirabegron ER (MYRBETRIQ) 25 MG TB24 tablet Take 1 tablet (25 mg total) by mouth daily.  . nitrofurantoin (MACRODANTIN) 50 MG capsule Take 1 capsule (50 mg total) by mouth at bedtime.  . nitrofurantoin, macrocrystal-monohydrate, (MACROBID) 100 MG capsule Take 1 capsule (100 mg total) by mouth every 12 (twelve) hours.  . Probiotic Product (PROBIOTIC DAILY PO) Take 1 capsule by  mouth daily.   . roflumilast (DALIRESP) 500 MCG TABS tablet Take 1 tablet (500 mcg total) by mouth daily.  Marland Kitchen SYNTHROID 88 MCG tablet Take 1 tablet (88 mcg total) by mouth daily.  Marland Kitchen triamcinolone cream (KENALOG) 0.1 % Apply 1 application topically 2 (two) times daily.  Marland Kitchen trimethoprim (TRIMPEX) 100 MG tablet Take 1 tablet (100 mg total) by mouth daily.  Marland Kitchen venlafaxine (EFFEXOR) 50 MG tablet TAKE 1 TABLET BY MOUTH TWICE DAILY  . vitamin B-12 (CYANOCOBALAMIN) 100 MCG tablet Take 100 mcg by mouth daily.   No facility-administered encounter medications on file as of 12/08/2019.     Review of Systems  Review of Systems  Constitutional: Negative for activity change, fatigue and fever.  HENT: Positive for  postnasal drip and rhinorrhea. Negative for sinus pressure, sinus pain and sore throat.   Respiratory: Positive for shortness of breath. Negative for cough and wheezing.   Cardiovascular: Negative for chest pain and palpitations.  Gastrointestinal: Negative for diarrhea, nausea and vomiting.  Musculoskeletal: Negative for arthralgias.  Neurological: Negative for dizziness.  Psychiatric/Behavioral: Positive for dysphoric mood. Negative for sleep disturbance. The patient is not nervous/anxious.      Physical Exam  BP 130/70 (BP Location: Left Arm, Cuff Size: Normal)   Pulse 81   Temp (!) 97.5 F (36.4 C) (Oral)   Ht 5' (1.524 m)   Wt 131 lb 6.4 oz (59.6 kg)   SpO2 95%   BMI 25.66 kg/m   Wt Readings from Last 5 Encounters:  12/08/19 131 lb 6.4 oz (59.6 kg)  11/25/19 135 lb (61.2 kg)  11/11/19 135 lb (61.2 kg)  11/10/19 135 lb 6.4 oz (61.4 kg)  10/25/19 130 lb (59 kg)    BMI Readings from Last 5 Encounters:  12/08/19 25.66 kg/m  11/25/19 26.37 kg/m  11/11/19 26.37 kg/m  11/10/19 26.44 kg/m  10/25/19 25.39 kg/m     Physical Exam Vitals and nursing note reviewed.  Constitutional:      General: She is not in acute distress.    Appearance: Normal appearance. She is normal weight.  HENT:     Head: Normocephalic and atraumatic.     Right Ear: External ear normal.     Left Ear: External ear normal.     Nose: Rhinorrhea present. No congestion.     Mouth/Throat:     Mouth: Mucous membranes are moist.     Pharynx: Oropharynx is clear.     Comments: Postnasal drip Eyes:     Pupils: Pupils are equal, round, and reactive to light.  Cardiovascular:     Rate and Rhythm: Normal rate and regular rhythm.     Pulses: Normal pulses.     Heart sounds: Normal heart sounds. No murmur heard.   Pulmonary:     Effort: Pulmonary effort is normal. No respiratory distress.     Breath sounds: Normal breath sounds. No decreased air movement. No decreased breath sounds, wheezing or  rales.  Abdominal:     General: Abdomen is flat. Bowel sounds are normal.     Palpations: Abdomen is soft.  Musculoskeletal:     Cervical back: Normal range of motion.  Skin:    General: Skin is warm and dry.     Capillary Refill: Capillary refill takes less than 2 seconds.  Neurological:     General: No focal deficit present.     Mental Status: She is alert and oriented to person, place, and time. Mental status is at baseline.  Gait: Gait normal.  Psychiatric:        Mood and Affect: Mood is anxious and depressed. Affect is flat.        Behavior: Behavior normal.        Thought Content: Thought content normal.        Judgment: Judgment normal.     Comments: Patient is saddened by her family stress.  She is also anxious about her online friend traveling to see her.  Her family is unaware.       Assessment & Plan:   COPD GOLD III B Plan: Continue Trelegy Ellipta Continue Daliresp Start a daily antihistamine Continue rescue inhaler Follow-up with our office in 6 to 8 weeks  Chronic hypoxemic respiratory failure ([place]) Plan: Continue oxygen therapy as prescribed  Allergic rhinitis Intolerant of Singulair Significant postnasal drip  Plan: Restart daily antihistamine Continue Astelin nasal spray  Swelling of lower extremity Needed furosemide on med list Patient has not been utilizing this Lower extremities 3+ bilaterally pitting edema  Plan: Lab work today Start taking furosemide as prescribed at least for the next 3 days  Shortness of breath Given patient's 3+ pitting edema on exam today suspect patient shortness of breath is likely multifactorial given patient's known chronic lung disease of COPD.  I do not believe this is a COPD exacerbation.  Believe she may be retaining more fluid based off of her exam as well as under significant amount of emotional stress which is contributing.  Healthcare maintenance Plan: Recommend patient obtain COVID-19  booster  Stress due to family tension Currently under significant mount of family stress and estranged from daughter, son as well as grandson.  Patient reports this is due to a online relationship with a person the opposite sex.  They have been talking since 2019.  Patient did send money to this person one time.  She reports this was under her own accord and she was not coerced.  This is been a difficult topic for the patient's family members to understand.  [name] online friend is moving to potentially live with her within the next week.  [name] family is unaware.  Plan: Encourage patient to speak with family about upcoming visit Encourage patient to follow-up with primary care for further support on emotional stress    Return in about 2 months (around 02/07/2020), or if symptoms worsen or fail to improve, for Follow up with [name], Follow up with Wyn Quaker FNP-C.   Lauraine Rinne, NP 12/08/2019   This appointment required 35 minutes of patient care (this includes precharting, chart review, review of results, face-to-face care, etc.).

## 2019-12-08 NOTE — Assessment & Plan Note (Signed)
Plan: Continue Trelegy Ellipta Continue [place] Start a daily antihistamine Continue rescue inhaler Follow-up with our office in 6 to 8 weeks

## 2019-12-08 NOTE — Assessment & Plan Note (Signed)
Given patient's 3+ pitting edema on exam today suspect patient shortness of breath is likely multifactorial given patient's known chronic lung disease of COPD.  I do not believe this is a COPD exacerbation.  Believe she may be retaining more fluid based off of her exam as well as under significant amount of emotional stress which is contributing.

## 2019-12-08 NOTE — Assessment & Plan Note (Signed)
Needed furosemide on med list Patient has not been utilizing this Lower extremities 3+ bilaterally pitting edema  Plan: Lab work today Start taking furosemide as prescribed at least for the next 3 days

## 2019-12-08 NOTE — Assessment & Plan Note (Signed)
Plan: Recommend patient obtain COVID-19 booster

## 2019-12-08 NOTE — Assessment & Plan Note (Signed)
Plan: Continue oxygen therapy as prescribed 

## 2019-12-08 NOTE — Assessment & Plan Note (Signed)
Currently under significant mount of family stress and estranged from daughter, son as well as grandson.  Patient reports this is due to a online relationship with a person the opposite sex.  They have been talking since 2019.  Patient did send money to this person one time.  She reports this was under her own accord and she was not coerced.  This is been a difficult topic for the patient's family members to understand.  [name] online friend is moving to potentially live with her within the next week.  [name] family is unaware.  Plan: Encourage patient to speak with family about upcoming visit Encourage patient to follow-up with primary care for further support on emotional stress

## 2019-12-09 NOTE — Progress Notes (Signed)
Reviewed and agree with assessment/plan.   Chesley Mires, MD [facility] 12/09/2019, 8:42 AM Pager:  [phone]

## 2019-12-11 ENCOUNTER — Other Ambulatory Visit: Payer: Self-pay | Admitting: Family Medicine

## 2019-12-14 ENCOUNTER — Other Ambulatory Visit: Payer: Self-pay | Admitting: *Deleted

## 2019-12-14 NOTE — Patient Outreach (Signed)
[facility]) Care Management  12/14/2019  Priscilla Cantrell August 26, 1935 [id number]   RN Health Coach attempted follow up outreach call to patient.  Patient was unavailable. No voicemail pick up. Plan: RN will call patient again within 30 days.  [facility] [phone]

## 2019-12-15 DIAGNOSIS — Z23 Encounter for immunization: Secondary | ICD-10-CM | POA: Diagnosis not present

## 2019-12-16 ENCOUNTER — Other Ambulatory Visit: Payer: Self-pay

## 2019-12-16 DIAGNOSIS — R7989 Other specified abnormal findings of blood chemistry: Secondary | ICD-10-CM

## 2019-12-19 ENCOUNTER — Telehealth: Payer: Self-pay

## 2019-12-19 ENCOUNTER — Other Ambulatory Visit: Payer: Self-pay

## 2019-12-19 ENCOUNTER — Ambulatory Visit (INDEPENDENT_AMBULATORY_CARE_PROVIDER_SITE_OTHER): Payer: Medicare Other

## 2019-12-19 DIAGNOSIS — R7989 Other specified abnormal findings of blood chemistry: Secondary | ICD-10-CM | POA: Diagnosis not present

## 2019-12-19 LAB — BASIC METABOLIC PANEL
BUN: 16 mg/dL (ref 6–23)
CO2: 28 mEq/L (ref 19–32)
Calcium: 9.3 mg/dL (ref 8.4–10.5)
Chloride: 105 mEq/L (ref 96–112)
Creatinine, Ser: 0.8 mg/dL (ref 0.40–1.20)
GFR: 68.52 mL/min (ref >60.00)
Glucose, Bld: 82 mg/dL (ref 70–99)
Potassium: 4.3 mEq/L (ref 3.5–5.1)
Sodium: 141 mEq/L (ref 135–145)

## 2019-12-19 NOTE — Telephone Encounter (Signed)
Please advise,  Pt would like to speak with you about blood work and wanting to know about possible rehab. She says "you know how I am".

## 2019-12-19 NOTE — Telephone Encounter (Signed)
Pls have her set up appt to discuss these things-thx

## 2019-12-20 ENCOUNTER — Other Ambulatory Visit: Payer: Self-pay | Admitting: *Deleted

## 2019-12-20 NOTE — Progress Notes (Signed)
Left message for pt to call back (lab results)

## 2019-12-20 NOTE — Patient Outreach (Signed)
[facility]) Care Management  Priscilla Cantrell  12/20/2019   Priscilla Cantrell 1935/02/20 [id number]  RN Health Coach telephone call to patient.  [employer] compliance verified. Per patient she is doing good. Patient is having some shortness of breath on exertion. She is not having any coughing. She is wearing her oxygen at night and carries a portable tank when goes out during the day if needed.   She is having some ankle swelling also. Patient is taking medications as per ordered. She has received her flu shot and COVID booster.  Patient is under stress with daughter being diagnosed with cancer. Patient has agreed to follow up outreach calls and support.    Encounter Medications:  Outpatient Encounter Medications as of 12/20/2019  Medication Sig  . albuterol (PROAIR HFA) 108 (90 Base) MCG/ACT inhaler Inhale 2 puffs into the lungs every 4 (four) hours as needed for wheezing or shortness of breath.  Marland Kitchen alendronate (FOSAMAX) 70 MG tablet Take 1 tablet (70 mg total) by mouth once a week. Take with a full glass of water on an empty stomach.  Marland Kitchen amLODipine (NORVASC) 5 MG tablet TAKE 1 TABLET(5 MG) BY MOUTH DAILY  . Artificial Tear Solution (SOOTHE XP) SOLN Place 1 drop into both eyes 2 (two) times daily as needed (irritation).  Marland Kitchen aspirin EC 81 MG tablet Take 81 mg by mouth daily.  Marland Kitchen azelastine (ASTELIN) 0.1 % nasal spray Place 2 sprays into both nostrils 2 (two) times daily. Use in each nostril as directed  . clobetasol (TEMOVATE) 0.05 % external solution APPLY TO THE AFFECTED AREA EVERY DAY  . diazepam (VALIUM) 5 MG tablet TAKE 1 TABLET BY MOUTH EVERY NIGHT AT BEDTIME AND MAY TAKE AN ADDITIONAL TABLET AS NEEDED FOR ANXIETY  . fluorometholone (FML) 0.1 % ophthalmic suspension Place 1 drop into both eyes as directed. Tapering off  . Fluticasone-Umeclidin-Vilant (TRELEGY ELLIPTA) 100-62.5-25 MCG/INH AEPB Inhale 1 puff into the lungs daily.  . furosemide (LASIX) 20 MG tablet TAKE 1 TABLET  BY MOUTH EVERY DAY AS NEEDED FOR INCREASED FLUID COLLECTION IN LEGS  . mirabegron ER (MYRBETRIQ) 25 MG TB24 tablet Take 1 tablet (25 mg total) by mouth daily.  . nitrofurantoin (MACRODANTIN) 50 MG capsule Take 1 capsule (50 mg total) by mouth at bedtime.  . nitrofurantoin, macrocrystal-monohydrate, (MACROBID) 100 MG capsule Take 1 capsule (100 mg total) by mouth every 12 (twelve) hours.  . Probiotic Product (PROBIOTIC DAILY PO) Take 1 capsule by mouth daily.   . roflumilast (DALIRESP) 500 MCG TABS tablet Take 1 tablet (500 mcg total) by mouth daily.  Marland Kitchen SYNTHROID 88 MCG tablet Take 1 tablet (88 mcg total) by mouth daily.  Marland Kitchen triamcinolone cream (KENALOG) 0.1 % Apply 1 application topically 2 (two) times daily.  Marland Kitchen trimethoprim (TRIMPEX) 100 MG tablet Take 1 tablet (100 mg total) by mouth daily.  Marland Kitchen venlafaxine (EFFEXOR) 50 MG tablet TAKE 1 TABLET BY MOUTH TWICE DAILY  . vitamin B-12 (CYANOCOBALAMIN) 100 MCG tablet Take 100 mcg by mouth daily.   No facility-administered encounter medications on file as of 12/20/2019.    Functional Status:  In your present state of health, do you have any difficulty performing the following activities: 04/05/2019  Hearing? N  Vision? N  Walking or climbing stairs? N  Dressing or bathing? N  Doing errands, shopping? N  Preparing Food and eating ? N  Using the Toilet? N  In the past six months, have you accidently leaked urine? Y  Comment  at night when sleeping  Do you have problems with loss of bowel control? N  Managing your Medications? N  Managing your Finances? Y  Comment for medications  Housekeeping or managing your Housekeeping? N  Some recent data might be hidden    Fall/Depression Screening: Fall Risk  12/20/2019 11/11/2019 09/29/2019  Falls in the past year? 1 0 1  Number falls in past yr: 0 0 0  Injury with Fall? 1 0 1  Comment - - -  Risk for fall due to : History of fall(s) - History of fall(s)  Risk for fall due to: Comment - - -  Follow  up Falls evaluation completed - Falls evaluation completed  Comment - - -   PHQ 2/9 Scores 04/05/2019 01/19/2019 11/03/2018 11/03/2018 08/20/2018 08/02/2018 08/02/2018  PHQ - 2 Score 1 0 0 0 0 0 0  PHQ- 9 Score - - - - - - -    Assessment:  Goals Addressed            This Visit's Progress   . (THN)Make and Keep All Appointments       Follow Up Date [id number]   - call to cancel if needed - keep a calendar with prescription refill dates - keep a calendar with appointment dates    Why is this important?   Part of staying healthy is seeing the doctor for follow-up care.  If you forget your appointments, there are some things you can do to stay on track.    Notes:     . (THN)Manage Fatigue (Tiredness)       Follow Up Date [id number]   - eat healthy - get outdoors every day (weather permitting) - use devices that will help like a cane, sock-puller or reacher    Why is this important?   Feeling tired or worn out is a common symptom of COPD (chronic obstructive pulmonary disease).  Learning when you feel your best and when you need rest is important.  Managing the tiredness (fatigue) will help you be active and enjoy life.     Notes:     . (THN)Manage My Emotions       Follow Up Date [id number]   - talk about feelings with a friend, family or spiritual advisor    Why is this important?   When you are stressed, down or upset, your body reacts too.  For example, your blood pressure may get higher; you may have a headache or stomachache.  When your emotions get the best of you, your body's ability to fight off cold and flu gets weak.  These steps will help you manage your emotions.     Notes:  Patient daughter will be staying with her during recovery time.     . (THN)Track and Manage My Symptoms       Follow Up Date [id number]   - develop a rescue plan - eliminate symptom triggers at home - follow rescue plan if symptoms flare-up - keep follow-up appointments    Why is this  important?   Tracking your symptoms and other information about your health helps your doctor plan your care.  Write down the symptoms, the time of day, what you were doing and what medicine you are taking.  You will soon learn how to manage your symptoms.     Notes:     . (THN)Track and Manage My Triggers       Follow Up Date [id number]   - identify and  remove indoor air pollutants - limit outdoor activity during cold weather - listen for public air quality announcements every day    Why is this important?   Triggers are activities or things, like tobacco smoke or cold weather, that make your COPD (chronic obstructive pulmonary disease) flare-up.  Knowing these triggers helps you plan how to stay away from them.  When you cannot remove them, you can learn how to manage them.     Notes:        Plan:  Patient will wear compression hose to decrease swelling Patient will use inhalers as per ordered Patient will continue to use oxygen safety  RN will follow up within the month of February Patient understands action plan for COPD exacerbation RN sent update assessment to PCP  [facility] [phone]

## 2019-12-21 DIAGNOSIS — R0602 Shortness of breath: Secondary | ICD-10-CM | POA: Diagnosis not present

## 2019-12-21 DIAGNOSIS — J449 Chronic obstructive pulmonary disease, unspecified: Secondary | ICD-10-CM | POA: Diagnosis not present

## 2019-12-21 DIAGNOSIS — G4733 Obstructive sleep apnea (adult) (pediatric): Secondary | ICD-10-CM | POA: Diagnosis not present

## 2019-12-22 DIAGNOSIS — R0602 Shortness of breath: Secondary | ICD-10-CM | POA: Diagnosis not present

## 2019-12-22 DIAGNOSIS — J449 Chronic obstructive pulmonary disease, unspecified: Secondary | ICD-10-CM | POA: Diagnosis not present

## 2019-12-22 DIAGNOSIS — G4733 Obstructive sleep apnea (adult) (pediatric): Secondary | ICD-10-CM | POA: Diagnosis not present

## 2019-12-22 NOTE — Patient Instructions (Signed)
Goals Addressed            This Visit's Progress    (THN)Make and Keep All Appointments       Follow Up Date [id number]   - call to cancel if needed - keep a calendar with prescription refill dates - keep a calendar with appointment dates    Why is this important?   Part of staying healthy is seeing the doctor for follow-up care.  If you forget your appointments, there are some things you can do to stay on track.    Notes:      (THN)Manage Fatigue (Tiredness)       Follow Up Date [id number]   - eat healthy - get outdoors every day (weather permitting) - use devices that will help like a cane, sock-puller or reacher    Why is this important?   Feeling tired or worn out is a common symptom of COPD (chronic obstructive pulmonary disease).  Learning when you feel your best and when you need rest is important.  Managing the tiredness (fatigue) will help you be active and enjoy life.     Notes:      (THN)Manage My Emotions       Follow Up Date [id number]   - talk about feelings with a friend, family or spiritual advisor    Why is this important?   When you are stressed, down or upset, your body reacts too.  For example, your blood pressure may get higher; you may have a headache or stomachache.  When your emotions get the best of you, your body's ability to fight off cold and flu gets weak.  These steps will help you manage your emotions.     Notes:  Patient daughter will be staying with her during recovery time.      (THN)Track and Manage My Symptoms       Follow Up Date [id number]   - develop a rescue plan - eliminate symptom triggers at home - follow rescue plan if symptoms flare-up - keep follow-up appointments    Why is this important?   Tracking your symptoms and other information about your health helps your doctor plan your care.  Write down the symptoms, the time of day, what you were doing and what medicine you are taking.  You will soon learn how to manage  your symptoms.     Notes:      (THN)Track and Manage My Triggers       Follow Up Date [id number]   - identify and remove indoor air pollutants - limit outdoor activity during cold weather - listen for public air quality announcements every day    Why is this important?   Triggers are activities or things, like tobacco smoke or cold weather, that make your COPD (chronic obstructive pulmonary disease) flare-up.  Knowing these triggers helps you plan how to stay away from them.  When you cannot remove them, you can learn how to manage them.     Notes:

## 2019-12-23 ENCOUNTER — Telehealth: Payer: Self-pay

## 2019-12-23 NOTE — Telephone Encounter (Signed)
Spoke with pt and called pharmacy. Pt has found the bottle of lasix that she picked up from the pharmacy on 12/12/19. She is keeping a log of leg size to monitor swelling.   [facility] - Client Nonclinical Telephone Record  [facility] Client [facility] - Client Client Site [facility] - Day Physician Crissie Sickles - MD Contact Type Call Who Is Calling Patient / Member / Family / Caregiver Caller Name [place] Phone Number [phone] Patient Name Priscilla Cantrell Patient DOB Jan 26, 1936 Call Type Message Only Information Provided Reason for Call Medication Question / Request Initial Comment Caller states she had some fluid problems and the office was going to call her in a prescription and she isn't sure if it has been called in or not. Additional Comment Caller declined triage, provided office hours to caller. Disp. Time Disposition Final User 12/22/2019 2:09:17 PM General Information Provided Yes Mockler, Tiffany Call Closed By: Marlou Sa Transaction Date/Time: 12/22/2019 2:06:34 PM (ET

## 2019-12-25 ENCOUNTER — Other Ambulatory Visit: Payer: Self-pay | Admitting: Family Medicine

## 2020-01-09 ENCOUNTER — Ambulatory Visit: Payer: Medicare Other | Admitting: *Deleted

## 2020-01-09 ENCOUNTER — Other Ambulatory Visit: Payer: Self-pay

## 2020-01-12 ENCOUNTER — Encounter: Payer: Self-pay | Admitting: Family Medicine

## 2020-01-12 ENCOUNTER — Ambulatory Visit (INDEPENDENT_AMBULATORY_CARE_PROVIDER_SITE_OTHER): Payer: Medicare Other | Admitting: Family Medicine

## 2020-01-12 ENCOUNTER — Other Ambulatory Visit: Payer: Self-pay

## 2020-01-12 VITALS — BP 138/74 | HR 69 | Temp 98.3°F | Ht 60.0 in | Wt 129.0 lb

## 2020-01-12 DIAGNOSIS — I872 Venous insufficiency (chronic) (peripheral): Secondary | ICD-10-CM

## 2020-01-12 DIAGNOSIS — G6289 Other specified polyneuropathies: Secondary | ICD-10-CM | POA: Diagnosis not present

## 2020-01-12 DIAGNOSIS — F411 Generalized anxiety disorder: Secondary | ICD-10-CM

## 2020-01-12 LAB — BASIC METABOLIC PANEL
BUN: 19 mg/dL (ref 6–23)
CO2: 31 mEq/L (ref 19–32)
Calcium: 9.7 mg/dL (ref 8.4–10.5)
Chloride: 104 mEq/L (ref 96–112)
Creatinine, Ser: 0.99 mg/dL (ref 0.40–1.20)
GFR: 53.03 mL/min — ABNORMAL LOW (ref >60.00)
Glucose, Bld: 75 mg/dL (ref 70–99)
Potassium: 4.3 mEq/L (ref 3.5–5.1)
Sodium: 142 mEq/L (ref 135–145)

## 2020-01-12 NOTE — Progress Notes (Signed)
OFFICE VISIT  01/12/2020  CC:  Chief Complaint  Patient presents with  . Leg Swelling    Pt noticed leg edema x 1 mo   HPI:    Patient is a 84 y.o. Caucasian female who presents for 2 mo f/u chronic bilat leg edema from chronic venous insufficiency. A/P as of last visit: "Venous stasis dermatitis, improving with triamcinolone 0.1% ointment. This is in the setting of bilat mild pitting edema from chronic venous insufficiency + varicose veins (asymptomatic). Continue ointment, did more education on this condition and signs to watch for that should prompt her to call or return. She will continue to use lasix 20mg  qd PRN for her LL edema---she rarely has to use this and does not need to use it at the current time. Low Na diet reiterated.  Evening sleepiness--no red flags. Obs.  Paresthesias: in the midst of w/u and treatment by neurologist."  INTERIM HX: Doing well. Legs w/out swelling but she does say she has been taking lasix every day, not trying any time off of it to see if swelling develops. Breathing stable.  Still dealing with chronic bilat LL paresthesias and RLS. Could not tolerate gabapentin (psych effects), neuro monitoring her off meds and has recommended PT for balance.   PMP AWARE reviewed today: most recent rx for diaz 5mg  was filled 11/24/19, # 41, rx by me. No red flags.   Past Medical History:  Diagnosis Date  . Allergic rhinitis    with upper airway cough: as of 07/2017 pulm f/u she was instructed to use astelin, flonase, and saline more consistently  . BPPV (benign paroxysmal positional vertigo) 03/2018  . Chest pain, non-cardiac    Cardiac CT showed no signif obstructive dz, mildly elevated calcium level (Dr. [name]).  . Chronic cystitis with hematuria    [name]  . Chronic hypoxemic respiratory failure ([place]) 02/02/2015   2L oxygen 24/7  . Cold hands    NCS/EMGs normal.  Hand dysesthesias per neuro--reassured  . COPD (chronic  obstructive pulmonary disease) ([facility])    GOLD 4.  spirometry 01/10/09 FEV 0.97(52%), FEV1% 47.  With chronic bronchitis as of 07/2017.  . DDD (degenerative disc disease), cervical    1998 MRI C5-6 impingemt (left).  MRI 2021 (neurol) 1 level canal stenosis, 1 level foraminal stenosis--->PT  . DDD (degenerative disc disease), lumbar   . Depression with anxiety 04/15/2011  . Diarrheal disease Summer 2017   ? 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).  As of 10/05/15 pt reports GI dx'd her with C diff and rx'd more flagyl and she is also on cholestyramine.  As of 02/2016, pt's sx's resolved completely.  . Diverticulosis of colon    Procto '97 and colonoscopy 2002  . Dysplastic nevus of upper extremity 04/2014   R tricep ([name])  . Edema of both lower extremities due to peripheral venous insufficiency    Varicosities bilat, swelling L>R.  R baker's cyst.  Got vein clinic eval 02/2018->sclerotherapy recommended but since no hemorrhage or ulceration, insurance will not cover the procedure.  . Family history of adverse reaction to anesthesia    mother died when patient age 73 receiving anesthesia for thyroid surgery  . Fibrocystic breast disease    w/fibroadenoma.  Bx's showed NO atypia (Dr. Margot Chimes).  Mammo neg 2008.  Marland Kitchen GERD (gastroesophageal reflux disease)   . History of double vision    Ophthalmologist, Dr. Herbert Deaner,  is further evaluating this with MRI  orbits and limited brain (myesthenia gravis testing neg 07/2010)  . History of home oxygen therapy    uses 2 liters at hs  . Hypertension    EKG 03/2010 normal  . Hypothyroidism    Hashimoto's, dx'd 1989  . Idiopathic peripheral neuropathy 04/2018   Sensory (numb feet) S1 distribution bilat, c/w spinal stenosis sensory neuropathy (no pain). 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  Labs  [place], MRI C spine->some spondylosis/stenosis.  UE NCS/EMS: normal 04/2019.  Marland Kitchen Lesion of right native kidney 2017   found on CT 02/2015.  F/u MRI 03/31/15 showed it to be smaller and less likely of any significance but repeat CT renal protocol in 45mo was recommended by radiology.  This was done 10/26/15 and showed no interval change in the lesion, but b/c the lesion enhances with contrast, radiol rec'd urol consult (b/c pap renal RCC could not be excluded).  Saw urol 03/06/16--stable on u/s 09/2016 and 09/2017.  . Osteoarthritis, hip, bilateral    she is s/p bilat THA as of 02/2018.  . Osteoporosis    radius, T score -2.5->rec'd alendronate 08/2019.  Marland Kitchen Pneumonia   . Postmenopausal atrophic vaginitis    improved with estrace 2 X/week.  . Prolapse of vaginal cuff after hysterectomy    10/2018: Dr. Wayne Both. Mild colpocele 04/2019 per GYN (no surgery)  . Pulmonary nodule    CT chest 12/10, June 2011.  No nodule on CT 06/2010.  Marland Kitchen Recurrent Clostridium difficile diarrhea    Episode 09/19/2016: resolved with prolonged course of flagyl.  11/2016 recurrence treated with 10d of Dificid (Fidaxomicin) by GI (Dr. Earlean Shawl).  . Recurrent UTI    likely related to pt's atrophic vaginitis.  Urol started vaginal estrace 03/2016.  . Rib fracture 09/2018   R 7th, laterally-->sustained when her dog pulled her over and she fell.  Marland Kitchen RLS (restless legs syndrome)    neuro->gabapentin trial 2020/21  . Shingles 02/2014   R abd/flank  . Spinal stenosis of lumbar region with neurogenic claudication    2008 MRI (L3-4, L4-5, L5-S1).  +S1 distribution sensory loss bilat    Past Surgical History:  Procedure Laterality Date  . ABDOMINAL HYSTERECTOMY  1978   no BSO per pt--nonmalignant reasons  . APPENDECTOMY    . BREAST CYST EXCISION     Last screening mammogram 10/2010 was normal.  . CHOLECYSTECTOMY  2001  . COLONOSCOPY  04/2005; 12/06/2015   2007 NORMAL.  2017 adenomatous polyp x 1.  Mod sigmoid diverticulosis (Dr. Earlean Shawl).     Marland Kitchen DEXA  05/18/2017   2019 NORMAL (T-score 0.1).  08/2019->T score -2.5. Rpt 08/2020.  Marland Kitchen HIP CLOSED REDUCTION Right 12/25/2015   Procedure: CLOSED REDUCTION HIP;  Surgeon: Nicholes Stairs, MD;  Location: [facility];  Service: Orthopedics;  Laterality: Right;  . NCV/EMS  10/2018   chronic and symmetric sensorimotor axonal polyneuropathy affecting the lower extremities  . TONSILLECTOMY    . TOTAL HIP ARTHROPLASTY Right 11/01/2014   Procedure: RIGHT TOTAL HIP ARTHROPLASTY;  Surgeon: Latanya Maudlin, MD;  Location: [facility];  Service: Orthopedics;  Laterality: Right;  . TOTAL HIP ARTHROPLASTY Left 12/08/2017   Procedure: LEFT TOTAL HIP ARTHROPLASTY ANTERIOR APPROACH;  Surgeon: Paralee Cancel, MD;  Location: [facility];  Service: Orthopedics;  Laterality: Left;  70 mins  . TRANSTHORACIC ECHOCARDIOGRAM  08/2014   EF 60-65%, grade I diast dysfxn  . TUBAL LIGATION  1974    Outpatient Medications  Prior to Visit  Medication Sig Dispense Refill  . albuterol (PROAIR HFA) 108 (90 Base) MCG/ACT inhaler Inhale 2 puffs into the lungs every 4 (four) hours as needed for wheezing or shortness of breath. 8.5 g 1  . alendronate (FOSAMAX) 70 MG tablet Take 1 tablet (70 mg total) by mouth once a week. Take with a full glass of water on an empty stomach. 12 tablet 3  . amLODipine (NORVASC) 5 MG tablet TAKE 1 TABLET(5 MG) BY MOUTH DAILY 90 tablet 0  . Artificial Tear Solution (SOOTHE XP) SOLN Place 1 drop into both eyes 2 (two) times daily as needed (irritation).    Marland Kitchen aspirin EC 81 MG tablet Take 81 mg by mouth daily.    Marland Kitchen azelastine (ASTELIN) 0.1 % nasal spray Place 2 sprays into both nostrils 2 (two) times daily. Use in each nostril as directed 30 mL 3  . clobetasol (TEMOVATE) 0.05 % external solution APPLY TO THE AFFECTED AREA EVERY DAY    . diazepam (VALIUM) 5 MG tablet TAKE 1 TABLET BY MOUTH EVERY NIGHT AT BEDTIME AND MAY TAKE AN ADDITIONAL TABLET AS NEEDED FOR ANXIETY 90 tablet 5  . fluorometholone (FML) 0.1 %  ophthalmic suspension Place 1 drop into both eyes as directed. Tapering off    . Fluticasone-Umeclidin-Vilant (TRELEGY ELLIPTA) 100-62.5-25 MCG/INH AEPB Inhale 1 puff into the lungs daily. 60 each 12  . furosemide (LASIX) 20 MG tablet TAKE 1 TABLET BY MOUTH EVERY DAY AS NEEDED FOR INCREASED FLUID COLLECTION IN LEGS 30 tablet 3  . mirabegron ER (MYRBETRIQ) 25 MG TB24 tablet Take 1 tablet (25 mg total) by mouth daily. 30 tablet 3  . Probiotic Product (PROBIOTIC DAILY PO) Take 1 capsule by mouth daily.     . roflumilast (DALIRESP) 500 MCG TABS tablet Take 1 tablet (500 mcg total) by mouth daily. 30 tablet 11  . SYNTHROID 88 MCG tablet Take 1 tablet (88 mcg total) by mouth daily. 30 tablet 5  . triamcinolone cream (KENALOG) 0.1 % Apply 1 application topically 2 (two) times daily. 30 g 0  . venlafaxine (EFFEXOR) 50 MG tablet TAKE 1 TABLET BY MOUTH TWICE DAILY 180 tablet 1  . VISINE 0.05 % ophthalmic solution 1 drop  as needed    . vitamin B-12 (CYANOCOBALAMIN) 100 MCG tablet Take 100 mcg by mouth daily.    . nitrofurantoin (MACRODANTIN) 50 MG capsule Take 1 capsule (50 mg total) by mouth at bedtime. 90 capsule 3  . nitrofurantoin, macrocrystal-monohydrate, (MACROBID) 100 MG capsule Take 1 capsule (100 mg total) by mouth every 12 (twelve) hours. 14 capsule 0   No facility-administered medications prior to visit.    Allergies  Allergen Reactions  . Losartan Other (See Comments)    hyperkalemia  . Penicillins Hives, Swelling and Rash    Has patient had a PCN reaction causing immediate rash, facial/tongue/throat swelling, SOB or lightheadedness with hypotension: YES Has patient had a PCN reaction causing severe rash involving mucus membranes or skin necrosis: NO Has patient had a PCN reaction that required hospitalization NO Has patient had a PCN reaction occurring within the last 10 years: NO If all of the above answers are "NO", then may proceed with Cephalosporin use.   . Cefixime  [Kdc:Cefixime] Other (See Comments)    unspecified  . Gabapentin Other (See Comments)    Feels woozy  . Indocin [Indomethacin] Other (See Comments)    Painful tongue and throat  . Singulair [Montelukast Sodium]     Chest tightness,  decreased mental clarity  . Sulfa Antibiotics Other (See Comments)    unspecified  . Ace Inhibitors Other (See Comments)    cough  . Statins Other (See Comments)    Myalgias     ROS As per HPI  PE: Vitals with BMI 01/12/2020 12/08/2019 11/25/2019  Height 5\' 0"  5\' 0"  5\' 0"   Weight 129 lbs 131 lbs 6 oz 135 lbs  BMI 25.19 16.94 50.38  Systolic 882 800 349  Diastolic 74 70 67  Pulse 69 81 85  Some encounter information is confidential and restricted. Go to Review Flowsheets activity to see all data.   Gen: Alert, well appearing.  Patient is oriented to person, place, time, and situation. AFFECT: pleasant, lucid thought and speech. CV: RRR, no m/r/g.   LUNGS: CTA bilat, nonlabored resps, good aeration in all lung fields. EXT: no clubbing or cyanosis.  no edema.  She has some non-inflamed varicose veins bilat LL's. No erythema.  Some mild diffuse hyperpigmentation skin changes of both LL's.   LABS:    Chemistry      Component Value Date/Time   NA 141 12/19/2019 1045   K 4.3 12/19/2019 1045   CL 105 12/19/2019 1045   CO2 28 12/19/2019 1045   BUN 16 12/19/2019 1045   CREATININE 0.80 12/19/2019 1045   CREATININE 0.84 02/27/2016 1533      Component Value Date/Time   CALCIUM 9.3 12/19/2019 1045   ALKPHOS 87 12/08/2019 1131   AST 13 12/08/2019 1131   ALT 9 12/08/2019 1131   BILITOT 0.5 12/08/2019 1131     Lab Results  Component Value Date   TSH 2.09 05/11/2019    IMPRESSION AND PLAN:  1) Bilat LE venous insufficiency edema: doing very well and has no edema currently. Encouraged her to use her lasix only on prn basis and see how things go. She has had an issue with sCr bumping up from regular use of lasix in the past. BMET check  today.  2) Anxiety: stable on chronic daily dosing of diazepam.  3) Bilat LL periph neurop and RLS: no meds at this point.  Intol of gabapentin. Neuro following/obs. Pt to arrange PT that neuro ordered.  An After Visit Summary was printed and given to the patient.  FOLLOW UP: Return in about 4 months (around 05/12/2020) for routine chronic illness f/u.  Signed:  Crissie Sickles, MD           01/12/2020

## 2020-01-24 ENCOUNTER — Ambulatory Visit: Payer: Medicare Other | Admitting: Family Medicine

## 2020-02-01 ENCOUNTER — Ambulatory Visit (INDEPENDENT_AMBULATORY_CARE_PROVIDER_SITE_OTHER): Payer: Medicare Other

## 2020-02-01 VITALS — Ht 60.0 in | Wt 129.0 lb

## 2020-02-01 DIAGNOSIS — Z0000 Encounter for general adult medical examination without abnormal findings: Secondary | ICD-10-CM

## 2020-02-01 NOTE — Progress Notes (Signed)
Subjective:   Priscilla Cantrell is a 84 y.o. female who presents for Medicare Annual (Subsequent) preventive examination.  I connected with Jahdai today by telephone and verified that I am speaking with the correct person using two identifiers. Location patient: home Location provider: work Persons participating in the virtual visit: patient, Marine scientist.    I discussed the limitations, risks, security and privacy concerns of performing an evaluation and management service by telephone and the availability of in person appointments. I also discussed with the patient that there may be a patient responsible charge related to this service. The patient expressed understanding and verbally consented to this telephonic visit.    Interactive audio and video telecommunications were attempted between this provider and patient, however failed, due to patient having technical difficulties OR patient did not have access to video capability.  We continued and completed visit with audio only.  Some vital signs may be absent or patient reported.   Time Spent with patient on telephone encounter: 25 minutes  Review of Systems     Cardiac Risk Factors include: advanced age (>65men, >35 women);hypertension     Objective:    Today's Vitals   02/01/20 1545  Weight: 129 lb (58.5 kg)  Height: 5' (1.524 m)   Body mass index is 25.19 kg/m.  Advanced Directives 02/01/2020 11/11/2019 02/10/2019 10/12/2018 09/08/2018 09/05/2018 09/01/2018  Does Patient Have a Medical Advance Directive? Yes Yes Yes Yes Yes Yes Yes  Type of Paramedic of [place];Living will Living will;Healthcare Power of [place];Out of facility DNR (pink MOST or yellow form) [place];Living will - - [place];Living will -  Does patient want to make changes to medical advance directive? - - - - No - Patient declined - -  Copy of [place] in Chart? Yes - validated most  recent copy scanned in chart (See row information) - - - - - -  Would patient like information on creating a medical advance directive? - - - - - - -    Current Medications (verified) Outpatient Encounter Medications as of 02/01/2020  Medication Sig  . albuterol (PROAIR HFA) 108 (90 Base) MCG/ACT inhaler Inhale 2 puffs into the lungs every 4 (four) hours as needed for wheezing or shortness of breath.  Marland Kitchen alendronate (FOSAMAX) 70 MG tablet Take 1 tablet (70 mg total) by mouth once a week. Take with a full glass of water on an empty stomach.  Marland Kitchen amLODipine (NORVASC) 5 MG tablet TAKE 1 TABLET(5 MG) BY MOUTH DAILY  . Artificial Tear Solution (SOOTHE XP) SOLN Place 1 drop into both eyes 2 (two) times daily as needed (irritation).  Marland Kitchen aspirin EC 81 MG tablet Take 81 mg by mouth daily.  Marland Kitchen azelastine (ASTELIN) 0.1 % nasal spray Place 2 sprays into both nostrils 2 (two) times daily. Use in each nostril as directed  . clobetasol (TEMOVATE) 0.05 % external solution APPLY TO THE AFFECTED AREA EVERY DAY  . diazepam (VALIUM) 5 MG tablet TAKE 1 TABLET BY MOUTH EVERY NIGHT AT BEDTIME AND MAY TAKE AN ADDITIONAL TABLET AS NEEDED FOR ANXIETY  . fluorometholone (FML) 0.1 % ophthalmic suspension Place 1 drop into both eyes as directed. Tapering off  . Fluticasone-Umeclidin-Vilant (TRELEGY ELLIPTA) 100-62.5-25 MCG/INH AEPB Inhale 1 puff into the lungs daily.  . furosemide (LASIX) 20 MG tablet TAKE 1 TABLET BY MOUTH EVERY DAY AS NEEDED FOR INCREASED FLUID COLLECTION IN LEGS  . mirabegron ER (MYRBETRIQ) 25 MG TB24  tablet Take 1 tablet (25 mg total) by mouth daily.  . Probiotic Product (PROBIOTIC DAILY PO) Take 1 capsule by mouth daily.   . roflumilast (DALIRESP) 500 MCG TABS tablet Take 1 tablet (500 mcg total) by mouth daily.  Marland Kitchen SYNTHROID 88 MCG tablet Take 1 tablet (88 mcg total) by mouth daily.  Marland Kitchen triamcinolone cream (KENALOG) 0.1 % Apply 1 application topically 2 (two) times daily.  Marland Kitchen venlafaxine (EFFEXOR) 50 MG  tablet TAKE 1 TABLET BY MOUTH TWICE DAILY  . VISINE 0.05 % ophthalmic solution 1 drop  as needed  . vitamin B-12 (CYANOCOBALAMIN) 100 MCG tablet Take 100 mcg by mouth daily.   No facility-administered encounter medications on file as of 02/01/2020.    Allergies (verified) Losartan, Penicillins, Cefixime [kdc:cefixime], Gabapentin, Indocin [indomethacin], Singulair [montelukast sodium], Sulfa antibiotics, Ace inhibitors, and Statins   History: Past Medical History:  Diagnosis Date  . Allergic rhinitis    with upper airway cough: as of 07/2017 pulm f/u she was instructed to use astelin, flonase, and saline more consistently  . BPPV (benign paroxysmal positional vertigo) 03/2018  . Chest pain, non-cardiac    Cardiac CT showed no signif obstructive dz, mildly elevated calcium level (Dr. [name]).  . Chronic cystitis with hematuria    [name]  . Chronic hypoxemic respiratory failure ([place]) 02/02/2015   2L oxygen 24/7  . Cold hands    NCS/EMGs normal.  Hand dysesthesias per neuro--reassured  . COPD (chronic obstructive pulmonary disease) (HCC)    GOLD 4.  spirometry 01/10/09 FEV 0.97(52%), FEV1% 47.  With chronic bronchitis as of 07/2017.  . DDD (degenerative disc disease), cervical    1998 MRI C5-6 impingemt (left).  MRI 2021 (neurol) 1 level canal stenosis, 1 level foraminal stenosis--->PT  . DDD (degenerative disc disease), lumbar   . Depression with anxiety 04/15/2011  . Diarrheal disease Summer 2017   ? 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).  As of 10/05/15 pt reports GI dx'd her with C diff and rx'd more flagyl and she is also on cholestyramine.  As of 02/2016, pt's sx's resolved completely.  . Diverticulosis of colon    Procto '97 and colonoscopy 2002  . Dysplastic nevus of upper extremity 04/2014   R tricep ([name])  . Edema of both lower extremities due to  peripheral venous insufficiency    Varicosities bilat, swelling L>R.  R baker's cyst.  Got vein clinic eval 02/2018->sclerotherapy recommended but since no hemorrhage or ulceration, insurance will not cover the procedure.  . Family history of adverse reaction to anesthesia    mother died when patient age 39 receiving anesthesia for thyroid surgery  . Fibrocystic breast disease    w/fibroadenoma.  Bx's showed NO atypia (Dr. Margot Chimes).  Mammo neg 2008.  Marland Kitchen GERD (gastroesophageal reflux disease)   . History of double vision    Ophthalmologist, Dr. Herbert Deaner, is further evaluating this with MRI  orbits and limited brain (myesthenia gravis testing neg 07/2010)  . History of home oxygen therapy    uses 2 liters at hs  . Hypertension    EKG 03/2010 normal  . Hypothyroidism    Hashimoto's, dx'd 1989  . Idiopathic peripheral neuropathy 04/2018   Sensory (numb feet) S1 distribution bilat, c/w spinal stenosis sensory neuropathy (no pain). 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  Labs Eschbach, MRI C spine->some spondylosis/stenosis.  UE NCS/EMS: normal 04/2019.  Marland Kitchen  Lesion of right native kidney 2017   found on CT 02/2015.  F/u MRI 03/31/15 showed it to be smaller and less likely of any significance but repeat CT renal protocol in 64mo was recommended by radiology.  This was done 10/26/15 and showed no interval change in the lesion, but b/c the lesion enhances with contrast, radiol rec'd urol consult (b/c pap renal RCC could not be excluded).  Saw urol 03/06/16--stable on u/s 09/2016 and 09/2017.  . Osteoarthritis, hip, bilateral    she is s/p bilat THA as of 02/2018.  . Osteoporosis    radius, T score -2.5->rec'd alendronate 08/2019.  Marland Kitchen Pneumonia   . Postmenopausal atrophic vaginitis    improved with estrace 2 X/week.  . Prolapse of vaginal cuff after hysterectomy    10/2018: Dr. Katherina Right. Mild colpocele 04/2019 per GYN (no surgery)  . Pulmonary nodule    CT  chest 12/10, June 2011.  No nodule on CT 06/2010.  Marland Kitchen Recurrent Clostridium difficile diarrhea    Episode 09/19/2016: resolved with prolonged course of flagyl.  11/2016 recurrence treated with 10d of Dificid (Fidaxomicin) by GI (Dr. Kinnie Scales).  . Recurrent UTI    likely related to pt's atrophic vaginitis.  Urol started vaginal estrace 03/2016.  . Rib fracture 09/2018   R 7th, laterally-->sustained when her dog pulled her over and she fell.  Marland Kitchen RLS (restless legs syndrome)    neuro->gabapentin trial 2020/21  . Shingles 02/2014   R abd/flank  . Spinal stenosis of lumbar region with neurogenic claudication    2008 MRI (L3-4, L4-5, L5-S1).  +S1 distribution sensory loss bilat   Past Surgical History:  Procedure Laterality Date  . ABDOMINAL HYSTERECTOMY  1978   no BSO per pt--nonmalignant reasons  . APPENDECTOMY    . BREAST CYST EXCISION     Last screening mammogram 10/2010 was normal.  . CHOLECYSTECTOMY  2001  . COLONOSCOPY  04/2005; 12/06/2015   2007 NORMAL.  2017 adenomatous polyp x 1.  Mod sigmoid diverticulosis (Dr. Kinnie Scales).    Marland Kitchen DEXA  05/18/2017   2019 NORMAL (T-score 0.1).  08/2019->T score -2.5. Rpt 08/2020.  Marland Kitchen HIP CLOSED REDUCTION Right 12/25/2015   Procedure: CLOSED REDUCTION HIP;  Surgeon: Yolonda Kida, MD;  Location: [facility];  Service: Orthopedics;  Laterality: Right;  . NCV/EMS  10/2018   chronic and symmetric sensorimotor axonal polyneuropathy affecting the lower extremities  . TONSILLECTOMY    . TOTAL HIP ARTHROPLASTY Right 11/01/2014   Procedure: RIGHT TOTAL HIP ARTHROPLASTY;  Surgeon: Ranee Gosselin, MD;  Location: [facility];  Service: Orthopedics;  Laterality: Right;  . TOTAL HIP ARTHROPLASTY Left 12/08/2017   Procedure: LEFT TOTAL HIP ARTHROPLASTY ANTERIOR APPROACH;  Surgeon: Durene Romans, MD;  Location: [facility];  Service: Orthopedics;  Laterality: Left;  70 mins  . TRANSTHORACIC ECHOCARDIOGRAM  08/2014   EF 60-65%, grade I diast dysfxn  . TUBAL LIGATION  1974   Family  History  Problem Relation Age of Onset  . Goiter Mother   . Psoriasis Father        od liver  . Cirrhosis Father   . Diabetes Daughter        Type 2  . Stroke Maternal Grandfather   . Stroke Paternal Grandmother   . Hypertension Paternal Grandmother   . Hernia Paternal Grandmother   . Cancer Paternal Grandfather        lung  . Cancer Maternal Aunt        cancer of the stomach  Social History   Socioeconomic History  . Marital status: Widowed    Spouse name: Not on file  . Number of children: 1  . Years of education: Not on file  . Highest education level: Not on file  Occupational History  . Occupation: Producer, television/film/video: [employer]  Tobacco Use  . Smoking status: Former Smoker    Packs/day: 1.50    Years: 34.00    Pack years: 51.00    Types: Cigarettes    Start date: 24    Quit date: 02/03/1990    Years since quitting: 30.0  . Smokeless tobacco: Never Used  Vaping Use  . Vaping Use: Never used  Substance and Sexual Activity  . Alcohol use: Yes    Comment: Rarely  . Drug use: No  . Sexual activity: Not Currently    Comment: 1st intercourse 54 yo-1 partner  Other Topics Concern  . Not on file  Social History Narrative   Widowed since fall 2014   Lives in [place], [place].      Worked as Research officer, trade union, retired 2017.   She has support of her daughter and grandson. She is independent with all care needs and transportation at this time. 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    She reports she is on SSI and receives another separate check    She reports her work hx included working in Engineer, mining for [employer] and volunteered in a [employer]   She reports having a short hair dog for 2 years  but denies noting any allergic reactions   Right handed   Two story home   Social Determinants of Health   Financial Resource Strain: [place]   .  Difficulty of Paying Living Expenses: Not hard at all  Food Insecurity: No Food Insecurity  . Worried About Charity fundraiser in the Last Year: Never true  . Ran Out of Food in the Last Year: Never true  Transportation Needs: No Transportation Needs  . Lack of Transportation (Medical): No  . Lack of Transportation (Non-Medical): No  Physical Activity: Inactive  . Days of Exercise per Week: 0 days  . Minutes of Exercise per Session: 0 min  Stress: No Stress Concern Present  . Feeling of Stress : Not at all  Social Connections: Moderately Isolated  . Frequency of Communication with Friends and Family: More than three times a week  . Frequency of Social Gatherings with Friends and Family: Once a week  . Attends Religious Services: More than 4 times per year  . Active Member of Clubs or Organizations: No  . Attends Archivist Meetings: Never  . Marital Status: Widowed    Tobacco Counseling Counseling given: Not Answered   Clinical Intake:  Pre-visit preparation completed: Yes  Pain : No/denies pain     Nutritional Status: BMI 25 -29 Overweight Nutritional Risks: None Diabetes: No  How often do you need to have someone help you when you read instructions, pamphlets, or other written materials from your doctor or pharmacy?: 1 - Never What is the last grade level you completed in school?: 1.5 yrs of college  Diabetic?No  Interpreter Needed?: No  Information entered by :: Caroleen Hamman LPN   Activities of Daily Living In your present state of health, do you have any difficulty performing the following activities: 02/01/2020 04/05/2019  Hearing? N  N  Vision? N N  Difficulty concentrating or making decisions? N -  Walking or climbing stairs? N N  Dressing or bathing? N N  Doing errands, shopping? N N  Preparing Food and eating ? N N  Using the Toilet? N N  In the past six months, have you accidently leaked urine? N Y  Comment - at night when sleeping  Do  you have problems with loss of bowel control? Y N  Comment occasionally -  Managing your Medications? N N  Managing your Finances? N Y  Comment - for medications  Housekeeping or managing your Housekeeping? N N  Some recent data might be hidden    Patient Care Team: Jeoffrey Massed, MD as PCP - General Jens Som, Madolyn Frieze, MD as Consulting Physician (Cardiology) Mateo Flow, MD as Consulting Physician (Ophthalmology) Coralyn Helling, MD as Consulting Physician (Pulmonary Disease) Ranee Gosselin, MD as Consulting Physician (Orthopedic Surgery) McKenzie, Mardene Celeste, MD as Consulting Physician (Urology) Sharrell Ku, MD as Consulting Physician (Gastroenterology) Durene Romans, MD as Consulting Physician (Orthopedic Surgery) Consuela Mimes, MD as Consulting Physician (Family Medicine) Coral Ceo, NP as Nurse Practitioner (Pulmonary Disease) Glendale Chard, DO as Consulting Physician (Neurology) Audie Box, Nadyne Coombes, MD (Inactive) as Consulting Physician (Gynecology) Pleasant, Dennard Schaumann, RN as [facility] Care Management Clark-Burning, Victorino Dike, PA-C (Inactive) (Dermatology)  Indicate any recent Medical Services you may have received from other than [facility] providers in the past year (date may be approximate).     Assessment:   This is a routine wellness examination for Priscilla Cantrell.  Hearing/Vision screen  Hearing Screening   125Hz  250Hz  500Hz  1000Hz  2000Hz  3000Hz  4000Hz  6000Hz  8000Hz   Right ear:           Left ear:           Comments: Has noticed some mild hearing loss  Vision Screening Comments: Wears glasses Last eye exam-2021-Dr.  Dietary issues and exercise activities discussed: Current Exercise Habits: The patient does not participate in regular exercise at present, Intensity: Mild, Exercise limited by: Other - see comments (neuropathy)  Goals      Patient Stated   .  patient (pt-stated)      Maintain current health by staying active.        Other   .  [facility] and Keep All Appointments      Follow Up Date   - call to cancel if needed - keep a calendar with prescription refill dates - keep a calendar with appointment dates    Why is this important?   Part of staying healthy is seeing the doctor for follow-up care.  If you forget your appointments, there are some things you can do to stay on track.    Notes:     .  (THN)Manage Fatigue (Tiredness)      Follow Up Date   - eat healthy - get outdoors every day (weather permitting) - use devices that will help like a cane, sock-puller or reacher    Why is this important?   Feeling tired or worn out is a common symptom of COPD (chronic obstructive pulmonary disease).  Learning when you feel your best and when you need rest is important.  Managing the tiredness (fatigue) will help you be active and enjoy life.     Notes:     .  [facility] My Emotions      Follow Up Date   - talk about feelings with a friend,  family or spiritual advisor    Why is this important?   When you are stressed, down or upset, your body reacts too.  For example, your blood pressure may get higher; you may have a headache or stomachache.  When your emotions get the best of you, your body's ability to fight off cold and flu gets weak.  These steps will help you manage your emotions.     Notes:  Patient daughter will be staying with her during recovery time.     .  (THN)Track and Manage My Symptoms      Follow Up Date [id number]   - develop a rescue plan - eliminate symptom triggers at home - follow rescue plan if symptoms flare-up - keep follow-up appointments    Why is this important?   Tracking your symptoms and other information about your health helps your doctor plan your care.  Write down the symptoms, the time of day, what you were doing and what medicine you are taking.  You will soon learn how to manage your symptoms.     Notes:     .  (THN)Track  and Manage My Triggers      Follow Up Date [id number]   - identify and remove indoor air pollutants - limit outdoor activity during cold weather - listen for public air quality announcements every day    Why is this important?   Triggers are activities or things, like tobacco smoke or cold weather, that make your COPD (chronic obstructive pulmonary disease) flare-up.  Knowing these triggers helps you plan how to stay away from them.  When you cannot remove them, you can learn how to manage them.     Notes:       Depression Screen PHQ 2/9 Scores 02/01/2020 01/12/2020 04/05/2019 01/19/2019 11/03/2018 11/03/2018 08/20/2018  PHQ - 2 Score 1 0 1 0 0 0 0  PHQ- 9 Score - - - - - - -    Fall Risk Fall Risk  02/01/2020 12/20/2019 11/11/2019 09/29/2019 08/31/2019  Falls in the past year? 1 1 0 1 1  Number falls in past yr: 0 0 0 0 1  Injury with Fall? 1 1 0 1 1  Comment - - - - -  Risk for fall due to : - History of fall(s) - History of fall(s) History of fall(s)  Risk for fall due to: Comment - - - - -  Follow up Falls prevention discussed Falls evaluation completed - Falls evaluation completed Falls evaluation completed  Comment - - - - -    FALL RISK PREVENTION PERTAINING TO THE HOME:  Any stairs in or around the home? Yes  If so, are there any without handrails? No  Home free of loose throw rugs in walkways, pet beds, electrical cords, etc? Yes  Adequate lighting in your home to reduce risk of falls? Yes   ASSISTIVE DEVICES UTILIZED TO PREVENT FALLS:  Life alert? No  Use of a cane, walker or w/c? No  Grab bars in the bathroom? Yes  Shower chair or bench in shower? No  Elevated toilet seat or a handicapped toilet? No   TIMED UP AND GO:  Was the test performed? No . Phone visit   Cognitive Function:Normal cognitive status assessed by this Nurse Health Advisor. No abnormalities found.          Immunizations Immunization History  Administered Date(s) Administered  . Fluad  Quad(high Dose 65+) 10/05/2018, 10/25/2019  . Influenza Split 11/04/2010, 10/23/2011  .  Influenza Whole 10/04/2009  . Influenza, High Dose Seasonal PF 11/07/2016, 11/04/2017  . Influenza,inj,Quad PF,6+ Mos 10/07/2012, 10/11/2014  . Influenza-Unspecified 11/07/2013, 11/01/2015  . Moderna Sars-Covid-2 Vaccination 02/14/2019, 03/15/2019, 12/15/2019  . Pneumococcal Conjugate-13 04/29/2013  . Pneumococcal Polysaccharide-23 02/03/2005, 11/28/2010, 11/01/2018  . Td 02/04/2008, 03/07/2010  . Zoster 02/10/2013  . Zoster Recombinat (Shingrix) 10/30/2016, 12/29/2016    TDAP status: Up to date  Flu Vaccine status: Up to date  Pneumococcal vaccine status: Up to date  Covid-19 vaccine status: Completed vaccines  Qualifies for Shingles Vaccine? No   Zostavax completed Yes   Shingrix Completed?: Yes  Screening Tests Health Maintenance  Topic Date Due  . TETANUS/TDAP  03/07/2020  . COVID-19 Vaccine (4 - Booster for Moderna series) 06/13/2020  . MAMMOGRAM  08/30/2020  . INFLUENZA VACCINE  Completed  . DEXA SCAN  Completed  . PNA vac Low Risk Adult  Completed    Health Maintenance  There are no preventive care reminders to display for this patient.  Colorectal cancer screening: No longer required.   Mammogram status: Completed Bilateral 08/31/2019. Repeat every year  Bone Density status: Completed 08/31/2019. Results reflect: Bone density results: OSTEOPOROSIS. Repeat every 2 years.  Lung Cancer Screening: (Low Dose CT Chest recommended if Age 81-80 years, 30 pack-year currently smoking OR have quit w/in 15years.) does not qualify.    Additional Screening:  Hepatitis C Screening: does not qualify  Vision Screening: Recommended annual ophthalmology exams for early detection of glaucoma and other disorders of the eye. Is the patient up to date with their annual eye exam?  Yes  Who is the provider or what is the name of the office in which the patient attends annual eye exams? Dr.  Kathlen Mody   Dental Screening: Recommended annual dental exams for proper oral hygiene  Community Resource Referral / Chronic Care Management: CRR required this visit?  No   CCM required this visit?  No      Plan:     I have personally reviewed and noted the following in the patient's chart:   . Medical and social history . Use of alcohol, tobacco or illicit drugs  . Current medications and supplements . Functional ability and status . Nutritional status . Physical activity . Advanced directives . List of other physicians . Hospitalizations, surgeries, and ER visits in previous 12 months . Vitals . Screenings to include cognitive, depression, and falls . Referrals and appointments  In addition, I have reviewed and discussed with patient certain preventive protocols, quality metrics, and best practice recommendations. A written personalized care plan for preventive services as well as general preventive health recommendations were provided to patient.   Due to this being a telephonic visit, the after visit summary with patients personalized plan was offered to patient via mail or my-chart.  Per request, patient was mailed a copy of [place], LPN   [id number]  Nurse Health Advisor  Nurse Notes: None

## 2020-02-01 NOTE — Patient Instructions (Signed)
Priscilla Cantrell , Thank you for taking time to complete your Medicare Wellness Visit. I appreciate your ongoing commitment to your health goals. Please review the following plan we discussed and let me know if I can assist you in the future.   Screening recommendations/referrals: Colonoscopy: No longer required Mammogram: Completed 08/31/2019-Due 08/30/2020 Bone Density: Completed 08/31/19-Due 08/30/2021 Recommended yearly ophthalmology/optometry visit for glaucoma screening and checkup Recommended yearly dental visit for hygiene and checkup  Vaccinations: Influenza vaccine: Up to date Pneumococcal vaccine: Completed vaccines Tdap vaccine: Up to date-Due-03/25/2020 Shingles vaccine: Completed vaccines   Covid-19:Completed vaccines  Advanced directives: Copy in chart  Conditions/risks identified: See problem list  Next appointment: Follow up in one year for your annual wellness visit    Preventive Care 65 Years and Older, Female Preventive care refers to lifestyle choices and visits with your health care provider that can promote health and wellness. What does preventive care include?  A yearly physical exam. This is also called an annual well check.  Dental exams once or twice a year.  Routine eye exams. Ask your health care provider how often you should have your eyes checked.  Personal lifestyle choices, including:  Daily care of your teeth and gums.  Regular physical activity.  Eating a healthy diet.  Avoiding tobacco and drug use.  Limiting alcohol use.  Practicing safe sex.  Taking low-dose aspirin every day.  Taking vitamin and mineral supplements as recommended by your health care provider. What happens during an annual well check? The services and screenings done by your health care provider during your annual well check will depend on your age, overall health, lifestyle risk factors, and family history of disease. Counseling  Your health care provider may ask  you questions about your:  Alcohol use.  Tobacco use.  Drug use.  Emotional well-being.  Home and relationship well-being.  Sexual activity.  Eating habits.  History of falls.  Memory and ability to understand (cognition).  Work and work Astronomer.  Reproductive health. Screening  You may have the following tests or measurements:  Height, weight, and BMI.  Blood pressure.  Lipid and cholesterol levels. These may be checked every 5 years, or more frequently if you are over 42 years old.  Skin check.  Lung cancer screening. You may have this screening every year starting at age 74 if you have a 30-pack-year history of smoking and currently smoke or have quit within the past 15 years.  Fecal occult blood test (FOBT) of the stool. You may have this test every year starting at age 101.  Flexible sigmoidoscopy or colonoscopy. You may have a sigmoidoscopy every 5 years or a colonoscopy every 10 years starting at age 67.  Hepatitis C blood test.  Hepatitis B blood test.  Sexually transmitted disease (STD) testing.  Diabetes screening. This is done by checking your blood sugar (glucose) after you have not eaten for a while (fasting). You may have this done every 1-3 years.  Bone density scan. This is done to screen for osteoporosis. You may have this done starting at age 60.  Mammogram. This may be done every 1-2 years. Talk to your health care provider about how often you should have regular mammograms. Talk with your health care provider about your test results, treatment options, and if necessary, the need for more tests. Vaccines  Your health care provider may recommend certain vaccines, such as:  Influenza vaccine. This is recommended every year.  Tetanus, diphtheria, and acellular pertussis (Tdap, Td)  vaccine. You may need a Td booster every 10 years.  Zoster vaccine. You may need this after age 45.  Pneumococcal 13-valent conjugate (PCV13) vaccine. One dose  is recommended after age 3.  Pneumococcal polysaccharide (PPSV23) vaccine. One dose is recommended after age 36. Talk to your health care provider about which screenings and vaccines you need and how often you need them. This information is not intended to replace advice given to you by your health care provider. Make sure you discuss any questions you have with your health care provider. Document Released: 02/16/2015 Document Revised: 10/10/2015 Document Reviewed: 11/21/2014 Elsevier Interactive Patient Education  2017 [place] Prevention in the Home Falls can cause injuries. They can happen to people of all ages. There are many things you can do to make your home safe and to help prevent falls. What can I do on the outside of my home?  Regularly fix the edges of walkways and driveways and fix any cracks.  Remove anything that might make you trip as you walk through a door, such as a raised step or threshold.  Trim any bushes or trees on the path to your home.  Use bright outdoor lighting.  Clear any walking paths of anything that might make someone trip, such as rocks or tools.  Regularly check to see if handrails are loose or broken. Make sure that both sides of any steps have handrails.  Any raised decks and porches should have guardrails on the edges.  Have any leaves, snow, or ice cleared regularly.  Use sand or salt on walking paths during winter.  Clean up any spills in your garage right away. This includes oil or grease spills. What can I do in the bathroom?  Use night lights.  Install grab bars by the toilet and in the tub and shower. Do not use towel bars as grab bars.  Use non-skid mats or decals in the tub or shower.  If you need to sit down in the shower, use a plastic, non-slip stool.  Keep the floor dry. Clean up any water that spills on the floor as soon as it happens.  Remove soap buildup in the tub or shower regularly.  Attach bath mats  securely with double-sided non-slip rug tape.  Do not have throw rugs and other things on the floor that can make you trip. What can I do in the bedroom?  Use night lights.  Make sure that you have a light by your bed that is easy to reach.  Do not use any sheets or blankets that are too big for your bed. They should not hang down onto the floor.  Have a firm chair that has side arms. You can use this for support while you get dressed.  Do not have throw rugs and other things on the floor that can make you trip. What can I do in the kitchen?  Clean up any spills right away.  Avoid walking on wet floors.  Keep items that you use a lot in easy-to-reach places.  If you need to reach something above you, use a strong step stool that has a grab bar.  Keep electrical cords out of the way.  Do not use floor polish or wax that makes floors slippery. If you must use wax, use non-skid floor wax.  Do not have throw rugs and other things on the floor that can make you trip. What can I do with my stairs?  Do not leave  any items on the stairs.  Make sure that there are handrails on both sides of the stairs and use them. Fix handrails that are broken or loose. Make sure that handrails are as long as the stairways.  Check any carpeting to make sure that it is firmly attached to the stairs. Fix any carpet that is loose or worn.  Avoid having throw rugs at the top or bottom of the stairs. If you do have throw rugs, attach them to the floor with carpet tape.  Make sure that you have a light switch at the top of the stairs and the bottom of the stairs. If you do not have them, ask someone to add them for you. What else can I do to help prevent falls?  Wear shoes that:  Do not have high heels.  Have rubber bottoms.  Are comfortable and fit you well.  Are closed at the toe. Do not wear sandals.  If you use a stepladder:  Make sure that it is fully opened. Do not climb a closed  stepladder.  Make sure that both sides of the stepladder are locked into place.  Ask someone to hold it for you, if possible.  Clearly mark and make sure that you can see:  Any grab bars or handrails.  First and last steps.  Where the edge of each step is.  Use tools that help you move around (mobility aids) if they are needed. These include:  Canes.  Walkers.  Scooters.  Crutches.  Turn on the lights when you go into a dark area. Replace any light bulbs as soon as they burn out.  Set up your furniture so you have a clear path. Avoid moving your furniture around.  If any of your floors are uneven, fix them.  If there are any pets around you, be aware of where they are.  Review your medicines with your doctor. Some medicines can make you feel dizzy. This can increase your chance of falling. Ask your doctor what other things that you can do to help prevent falls. This information is not intended to replace advice given to you by your health care provider. Make sure you discuss any questions you have with your health care provider. Document Released: 11/16/2008 Document Revised: 06/28/2015 Document Reviewed: 02/24/2014 Elsevier Interactive Patient Education  2017 [employer].

## 2020-02-07 DIAGNOSIS — R197 Diarrhea, unspecified: Secondary | ICD-10-CM | POA: Diagnosis not present

## 2020-02-08 ENCOUNTER — Other Ambulatory Visit: Payer: Medicare Other

## 2020-02-08 ENCOUNTER — Other Ambulatory Visit: Payer: Self-pay

## 2020-02-08 DIAGNOSIS — N390 Urinary tract infection, site not specified: Secondary | ICD-10-CM

## 2020-02-10 ENCOUNTER — Telehealth: Payer: Self-pay | Admitting: Urology

## 2020-02-10 NOTE — Telephone Encounter (Signed)
Patient called and LM requesting results of urine drop off from the other day.

## 2020-02-12 ENCOUNTER — Emergency Department (HOSPITAL_BASED_OUTPATIENT_CLINIC_OR_DEPARTMENT_OTHER)
Admission: EM | Admit: 2020-02-12 | Discharge: 2020-02-12 | Disposition: A | Discharge status: Home / Self Care | Payer: Medicare Other | Attending: Emergency Medicine | Admitting: Emergency Medicine

## 2020-02-12 ENCOUNTER — Encounter (HOSPITAL_BASED_OUTPATIENT_CLINIC_OR_DEPARTMENT_OTHER): Payer: Self-pay | Admitting: Emergency Medicine

## 2020-02-12 ENCOUNTER — Other Ambulatory Visit: Payer: Self-pay

## 2020-02-12 DIAGNOSIS — Z79899 Other long term (current) drug therapy: Secondary | ICD-10-CM | POA: Diagnosis not present

## 2020-02-12 DIAGNOSIS — Z7951 Long term (current) use of inhaled steroids: Secondary | ICD-10-CM | POA: Diagnosis not present

## 2020-02-12 DIAGNOSIS — N3000 Acute cystitis without hematuria: Secondary | ICD-10-CM | POA: Insufficient documentation

## 2020-02-12 DIAGNOSIS — E039 Hypothyroidism, unspecified: Secondary | ICD-10-CM | POA: Diagnosis not present

## 2020-02-12 DIAGNOSIS — I10 Essential (primary) hypertension: Secondary | ICD-10-CM | POA: Insufficient documentation

## 2020-02-12 DIAGNOSIS — Z87891 Personal history of nicotine dependence: Secondary | ICD-10-CM | POA: Insufficient documentation

## 2020-02-12 DIAGNOSIS — Z96643 Presence of artificial hip joint, bilateral: Secondary | ICD-10-CM | POA: Diagnosis not present

## 2020-02-12 DIAGNOSIS — J449 Chronic obstructive pulmonary disease, unspecified: Secondary | ICD-10-CM | POA: Diagnosis not present

## 2020-02-12 DIAGNOSIS — R300 Dysuria: Secondary | ICD-10-CM | POA: Diagnosis present

## 2020-02-12 LAB — URINALYSIS, ROUTINE W REFLEX MICROSCOPIC
Bilirubin Urine: NEGATIVE
Glucose, UA: NEGATIVE mg/dL
Ketones, ur: NEGATIVE mg/dL
Nitrite: NEGATIVE
Protein, ur: >300 mg/dL — AB
Specific Gravity, Urine: 1.02 (ref 1.005–1.030)
pH: 6.5 (ref 5.0–8.0)

## 2020-02-12 LAB — URINALYSIS, MICROSCOPIC (REFLEX)
RBC / HPF: >50 RBC/hpf (ref 0–5)
WBC, UA: >50 WBC/hpf (ref 0–5)

## 2020-02-12 MED ORDER — CIPROFLOXACIN HCL 500 MG PO TABS: 500.0000 mg | ORAL_TABLET | Freq: Two times a day (BID) | ORAL | 0 refills | Status: AC

## 2020-02-12 NOTE — ED Notes (Signed)
Pt obtaining UA

## 2020-02-12 NOTE — ED Notes (Signed)
Spoke with Josh in lab to add on urine culture 

## 2020-02-12 NOTE — ED Notes (Signed)
Pt changed into gown, urine specimen provided in triage

## 2020-02-12 NOTE — ED Notes (Signed)
Pt discharged to home. Discharge instructions have been discussed with patient and/or family members. Pt verbally acknowledges understanding d/c instructions, and endorses comprehension to checkout at registration before leaving.  °

## 2020-02-12 NOTE — ED Triage Notes (Signed)
Pt reports dysuria and urinary frequency x 1 week.

## 2020-02-12 NOTE — ED Provider Notes (Signed)
[facility]PART[facility] Provider Note   CSN: [id number] Arrival date & time: 02/12/20  [id number]     History Chief Complaint  Patient presents with   Dysuria    Priscilla Cantrell is a 85 y.o. female.  HPI      Urinary symptoms, dysuria 12/22 began If walking will have urinary frequency, urgency No abdominal pain, no new back pain (hurts anyway, upper part, both sides) No nausea/vomiting or fevers Hx of prolapse but was not out the last time she checked  New job at [employer]  Past Medical History:  Diagnosis Date   Allergic rhinitis    with upper airway cough: as of 07/2017 pulm f/u she was instructed to use astelin, flonase, and saline more consistently   BPPV (benign paroxysmal positional vertigo) 03/2018   Chest pain, non-cardiac    Cardiac CT showed no signif obstructive dz, mildly elevated calcium level (Dr. [name]).   Chronic cystitis with hematuria    [name]   Chronic hypoxemic respiratory failure ([place]) 02/02/2015   2L oxygen 24/7   Cold hands    NCS/EMGs normal.  Hand dysesthesias per neuro--reassured   COPD (chronic obstructive pulmonary disease) ([place])    GOLD 4.  spirometry 01/10/09 FEV 0.97(52%), FEV1% 47.  With chronic bronchitis as of 07/2017.   DDD (degenerative disc disease), cervical    1998 MRI C5-6 impingemt (left).  MRI 2021 (neurol) 1 level canal stenosis, 1 level foraminal stenosis--->PT   DDD (degenerative disc disease), lumbar    Depression with anxiety 04/15/2011   Diarrheal disease Summer 2017   ? 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).  As of 10/05/15 pt reports GI dx'd her with C diff and rx'd more flagyl and she is also on cholestyramine.  As of 02/2016, pt's sx's resolved completely.   Diverticulosis of colon    Procto '97 and colonoscopy 2002   Dysplastic nevus of upper extremity 04/2014   R  tricep ([name])   Edema of both lower extremities due to peripheral venous insufficiency    Varicosities bilat, swelling L>R.  R baker's cyst.  Got vein clinic eval 02/2018->sclerotherapy recommended but since no hemorrhage or ulceration, insurance will not cover the procedure.   Family history of adverse reaction to anesthesia    mother died when patient age 74 receiving anesthesia for thyroid surgery   Fibrocystic breast disease    w/fibroadenoma.  Bx's showed NO atypia (Dr. Margot Chimes).  Mammo neg 2008.   GERD (gastroesophageal reflux disease)    History of double vision    Ophthalmologist, Dr. Herbert Deaner, is further evaluating this with MRI  orbits and limited brain (myesthenia gravis testing neg 07/2010)   History of home oxygen therapy    uses 2 liters at hs   Hypertension    EKG 03/2010 normal   Hypothyroidism    Hashimoto's, dx'd 1989   Idiopathic peripheral neuropathy 04/2018   Sensory (numb feet) S1 distribution bilat, c/w spinal stenosis sensory neuropathy (no pain). 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  Labs [place], MRI C spine->some spondylosis/stenosis.  UE NCS/EMS: normal 04/2019.   Lesion of right native kidney 2017   found on CT 02/2015.  F/u MRI 03/31/15 showed it to be smaller and less likely of any significance but repeat CT renal protocol in 57mo was recommended by radiology.  This was done 10/26/15 and showed no interval change  in the lesion, but b/c the lesion enhances with contrast, radiol rec'd urol consult (b/c pap renal RCC could not be excluded).  Saw urol 03/06/16--stable on u/s 09/2016 and 09/2017.   Osteoarthritis, hip, bilateral    she is s/p bilat THA as of 02/2018.   Osteoporosis    radius, T score -2.5->rec'd alendronate 08/2019.   Pneumonia    Postmenopausal atrophic vaginitis    improved with estrace 2 X/week.   Prolapse of vaginal cuff after hysterectomy    10/2018: Dr. Wayne Both. Mild  colpocele 04/2019 per GYN (no surgery)   Pulmonary nodule    CT chest 12/10, June 2011.  No nodule on CT 06/2010.   Recurrent Clostridium difficile diarrhea    Episode 09/19/2016: resolved with prolonged course of flagyl.  11/2016 recurrence treated with 10d of Dificid (Fidaxomicin) by GI (Dr. Earlean Shawl).   Recurrent UTI    likely related to pt's atrophic vaginitis.  Urol started vaginal estrace 03/2016.   Rib fracture 09/2018   R 7th, laterally-->sustained when her dog pulled her over and she fell.   RLS (restless legs syndrome)    neuro->gabapentin trial 2020/21   Shingles 02/2014   R abd/flank   Spinal stenosis of lumbar region with neurogenic claudication    2008 MRI (L3-4, L4-5, L5-S1).  +S1 distribution sensory loss bilat    Patient Active Problem List   Diagnosis Date Noted   Shortness of breath 12/08/2019   Swelling of lower extremity 12/08/2019   Stress due to family tension 12/08/2019   Chronic cystitis with hematuria 08/15/2019   Urinary urgency 08/15/2019   Multiple closed fractures of ribs of right side 10/05/2018   Healthcare maintenance 10/05/2018   Medication management 07/29/2018   Overweight (BMI 25.0-29.9) 12/09/2017   S/P left KA 12/08/2017   S/P left THA, AA 12/08/2017   Postherpetic neuralgia 03/07/2015   Constipation 02/28/2015   Renal mass 02/28/2015   Acute pyelonephritis 02/20/2015   Oral thrush 02/08/2015   Chronic hypoxemic respiratory failure ([place]) 02/02/2015   SOB (shortness of breath)    Essential hypertension 01/24/2015   UTI (urinary tract infection) 01/09/2015   Pain and swelling of right lower leg 01/09/2015   H/O total hip arthroplasty 11/01/2014   Spinal stenosis, lumbar region, with neurogenic claudication 01/04/2014   Osteoarthritis, hip, bilateral 07/27/2013   Sciatica associated with disorder of lumbosacral spine 04/19/2013   Atrophic vaginitis 01/12/2012   Periodic limb movement disorder 11/24/2011    Hypoxia 05/22/2011   Depression with anxiety 04/15/2011   Chronic venous insufficiency 07/29/2010   Hypothyroidism due to Hashimoto's thyroiditis 01/07/2010   SPINAL STENOSIS 01/07/2010   Allergic rhinitis 08/09/2007   COPD GOLD III B 08/09/2007    Past Surgical History:  Procedure Laterality Date   ABDOMINAL HYSTERECTOMY  1978   no BSO per pt--nonmalignant reasons   APPENDECTOMY     BREAST CYST EXCISION     Last screening mammogram 10/2010 was normal.   CHOLECYSTECTOMY  2001   COLONOSCOPY  04/2005; 12/06/2015   2007 NORMAL.  2017 adenomatous polyp x 1.  Mod sigmoid diverticulosis (Dr. Earlean Shawl).     DEXA  05/18/2017   2019 NORMAL (T-score 0.1).  08/2019->T score -2.5. Rpt 08/2020.   HIP CLOSED REDUCTION Right 12/25/2015   Procedure: CLOSED REDUCTION HIP;  Surgeon: Nicholes Stairs, MD;  Location: [facility];  Service: Orthopedics;  Laterality: Right;   NCV/EMS  10/2018   chronic and symmetric sensorimotor axonal polyneuropathy affecting the lower extremities   TONSILLECTOMY  TOTAL HIP ARTHROPLASTY Right 11/01/2014   Procedure: RIGHT TOTAL HIP ARTHROPLASTY;  Surgeon: Latanya Maudlin, MD;  Location: [facility];  Service: Orthopedics;  Laterality: Right;   TOTAL HIP ARTHROPLASTY Left 12/08/2017   Procedure: LEFT TOTAL HIP ARTHROPLASTY ANTERIOR APPROACH;  Surgeon: Paralee Cancel, MD;  Location: [facility];  Service: Orthopedics;  Laterality: Left;  70 mins   TRANSTHORACIC ECHOCARDIOGRAM  08/2014   EF 60-65%, grade I diast dysfxn   TUBAL LIGATION  1974     OB History    Gravida  1   Para  1   Term      Preterm      AB      Living  1     SAB      IAB      Ectopic      Multiple      Live Births              Family History  Problem Relation Age of Onset   Goiter Mother    Psoriasis Father        od liver   Cirrhosis Father    Diabetes Daughter        Type 2   Stroke Maternal Grandfather    Stroke Paternal Grandmother    Hypertension Paternal  Grandmother    Hernia Paternal Grandmother    Cancer Paternal Grandfather        lung   Cancer Maternal Aunt        cancer of the stomach    Social History   Tobacco Use   Smoking status: Former Smoker    Packs/day: 1.50    Years: 34.00    Pack years: 51.00    Types: Cigarettes    Start date: 22    Quit date: 02/03/1990    Years since quitting: 30.0   Smokeless tobacco: Never Used  Vaping Use   Vaping Use: Never used  Substance Use Topics   Alcohol use: Yes    Comment: Rarely   Drug use: No    Home Medications Prior to Admission medications   Medication Sig Start Date End Date Taking? Authorizing Provider  ciprofloxacin (CIPRO) 500 MG tablet Take 1 tablet (500 mg total) by mouth every 12 (twelve) hours for 7 days. 02/12/20 02/19/20 Yes Gareth Morgan, MD  albuterol (PROAIR HFA) 108 (90 Base) MCG/ACT inhaler Inhale 2 puffs into the lungs every 4 (four) hours as needed for wheezing or shortness of breath. 02/04/18   McGowen, Adrian Blackwater, MD  alendronate (FOSAMAX) 70 MG tablet Take 1 tablet (70 mg total) by mouth once a week. Take with a full glass of water on an empty stomach. 09/05/19   McGowen, Adrian Blackwater, MD  amLODipine (NORVASC) 5 MG tablet TAKE 1 TABLET(5 MG) BY MOUTH DAILY 12/26/19   McGowen, Adrian Blackwater, MD  Artificial Tear Solution (SOOTHE XP) SOLN Place 1 drop into both eyes 2 (two) times daily as needed (irritation).    [provider]  aspirin EC 81 MG tablet Take 81 mg by mouth daily.    [provider]  azelastine (ASTELIN) 0.1 % nasal spray Place 2 sprays into both nostrils 2 (two) times daily. Use in each nostril as directed 03/11/17   McGowen, Adrian Blackwater, MD  clobetasol (TEMOVATE) 0.05 % external solution APPLY TO THE AFFECTED AREA EVERY DAY 05/02/19   [provider]  diazepam (VALIUM) 5 MG tablet TAKE 1 TABLET BY MOUTH EVERY NIGHT AT BEDTIME AND MAY TAKE AN  ADDITIONAL TABLET AS NEEDED FOR ANXIETY 08/16/19   McGowen, Adrian Blackwater, MD  fluorometholone  (FML) 0.1 % ophthalmic suspension Place 1 drop into both eyes as directed. Tapering off    [provider]  Fluticasone-Umeclidin-Vilant (TRELEGY ELLIPTA) 100-62.5-25 MCG/INH AEPB Inhale 1 puff into the lungs daily. 10/04/19   Lauraine Rinne, NP  furosemide (LASIX) 20 MG tablet TAKE 1 TABLET BY MOUTH EVERY DAY AS NEEDED FOR INCREASED FLUID COLLECTION IN LEGS 12/12/19   McGowen, Adrian Blackwater, MD  mirabegron ER (MYRBETRIQ) 25 MG TB24 tablet Take 1 tablet (25 mg total) by mouth daily. 09/15/19   McKenzie, [name], MD  Probiotic Product (PROBIOTIC DAILY PO) Take 1 capsule by mouth daily.     [provider]  roflumilast (DALIRESP) 500 MCG TABS tablet Take 1 tablet (500 mcg total) by mouth daily. 08/09/18   Lauraine Rinne, NP  SYNTHROID 88 MCG tablet Take 1 tablet (88 mcg total) by mouth daily. 08/03/19   McGowen, Adrian Blackwater, MD  triamcinolone cream (KENALOG) 0.1 % Apply 1 application topically 2 (two) times daily. 08/08/16   Kuneff, Renee A, DO  venlafaxine (EFFEXOR) 50 MG tablet TAKE 1 TABLET BY MOUTH TWICE DAILY 11/15/19   McGowen, Adrian Blackwater, MD  VISINE 0.05 % ophthalmic solution 1 drop  as needed 12/14/19   [provider]  vitamin B-12 (CYANOCOBALAMIN) 100 MCG tablet Take 100 mcg by mouth daily.    [provider]    Allergies    Losartan, Penicillins, Cefixime [kdc:cefixime], Gabapentin, Indocin [indomethacin], Singulair [montelukast sodium], Sulfa antibiotics, Ace inhibitors, and Statins  Review of Systems   Review of Systems  Constitutional: Negative for fever.  Respiratory: Negative for cough and shortness of breath.   Gastrointestinal: Negative for abdominal pain, nausea and vomiting.  Genitourinary: Positive for dysuria and frequency.  Musculoskeletal: Positive for back pain.  Skin: Negative for rash.  Neurological: Negative for headaches.    Physical Exam Updated Vital Signs BP (!) 175/90 (BP Location: Right Arm)    Pulse 81    Temp 97.6 F (36.4 C) (Oral)     Resp 20    Ht 5' (1.524 m)    Wt 59 kg    SpO2 98%    BMI 25.39 kg/m   Physical Exam Vitals and nursing note reviewed.  Constitutional:      General: She is not in acute distress.    Appearance: Normal appearance. She is not ill-appearing, toxic-appearing or diaphoretic.  HENT:     Head: Normocephalic.  Eyes:     Conjunctiva/sclera: Conjunctivae normal.  Cardiovascular:     Rate and Rhythm: Normal rate and regular rhythm.     Pulses: Normal pulses.  Pulmonary:     Effort: Pulmonary effort is normal. No respiratory distress.  Abdominal:     General: Abdomen is flat. There is no distension.     Tenderness: There is no abdominal tenderness. There is no right CVA tenderness, left CVA tenderness or guarding.  Musculoskeletal:        General: No deformity or signs of injury.     Cervical back: No rigidity.  Skin:    General: Skin is warm and dry.     Coloration: Skin is not jaundiced or pale.  Neurological:     General: No focal deficit present.     Mental Status: She is alert and oriented to person, place, and time.     ED Results / Procedures / Treatments   Labs (all labs ordered  are listed, but only abnormal results are displayed) Labs Reviewed  URINE CULTURE - Abnormal; Notable for the following components:      Result Value   Culture   (*)    Value: <10,000 COLONIES/mL INSIGNIFICANT GROWTH Performed at [facility], [place] [address], [place], [place] [address]    All other components within normal limits  URINALYSIS, ROUTINE W REFLEX MICROSCOPIC - Abnormal; Notable for the following components:   APPearance CLOUDY (*)    Hgb urine dipstick LARGE (*)    Protein, ur >300 (*)    Leukocytes,Ua MODERATE (*)    All other components within normal limits  URINALYSIS, MICROSCOPIC (REFLEX) - Abnormal; Notable for the following components:   Bacteria, UA MANY (*)    All other components within normal limits    EKG None  Radiology No results  found.  Procedures Procedures (including critical care time)  Medications Ordered in ED Medications - No data to display  ED Course  I have reviewed the triage vital signs and the nursing notes.  Pertinent labs & imaging results that were available during my care of the patient were reviewed by me and considered in my medical decision making (see chart for details).    MDM Rules/Calculators/A&P                          85yo female presents with concern for dysuria, frequency, urgency.  No abdominal pain, flank pain, nausea or vomiting.  Urine consistent with UTI with greater than 50WBC, many bacteria.  Due to allergies, given rx for cipro. Discussed risks of CDiff returning given her history. Patient discharged in stable condition with understanding of reasons to return.    Final Clinical Impression(s) / ED Diagnoses Final diagnoses:  Acute cystitis without hematuria    Rx / DC Orders ED Discharge Orders         Ordered    ciprofloxacin (CIPRO) 500 MG tablet  Every 12 hours        02/12/20 1059           Gareth Morgan, MD 02/14/20 [phone]

## 2020-02-13 ENCOUNTER — Ambulatory Visit: Payer: Medicare Other | Admitting: Pulmonary Disease

## 2020-02-13 ENCOUNTER — Telehealth: Payer: Self-pay | Admitting: Family Medicine

## 2020-02-13 LAB — URINE CULTURE: Culture: <10000 — AB

## 2020-02-13 NOTE — Telephone Encounter (Signed)
Patient went to [facility] 02/12/20 for UTI symptoms and was given Cipro. Patient states she was told to followup with Dr. Anitra Lauth but we are unsure if she needs additional appointment. Please call patient to advise.

## 2020-02-13 NOTE — Telephone Encounter (Signed)
Patient will need follow up appointment regarding UTI symptoms. Please assist with scheduling, thanks.

## 2020-02-13 NOTE — Telephone Encounter (Signed)
Left message with patient's daughter for her to call the office regarding scheduling.

## 2020-02-13 NOTE — Telephone Encounter (Signed)
Pt went to urgent care in [place] Pt was given cipro 500mg  BID for 7 days. Started on 1/9  Reports symptoms are slightly improving.

## 2020-02-15 ENCOUNTER — Other Ambulatory Visit: Payer: Self-pay | Admitting: Family Medicine

## 2020-02-15 NOTE — Telephone Encounter (Signed)
RF request for valium LOV:01/12/20 Next ov: 02/20/20 Last written: 08/16/19 (90,5)

## 2020-02-16 ENCOUNTER — Other Ambulatory Visit: Payer: Self-pay | Admitting: Pulmonary Disease

## 2020-02-16 MED ORDER — ROFLUMILAST 500 MCG PO TABS: 500.0000 ug | ORAL_TABLET | Freq: Every day | ORAL | 11 refills | Status: DC

## 2020-02-20 ENCOUNTER — Ambulatory Visit: Payer: Medicare Other | Admitting: Family Medicine

## 2020-02-24 ENCOUNTER — Encounter: Payer: Self-pay | Admitting: Urology

## 2020-02-24 ENCOUNTER — Other Ambulatory Visit: Payer: Self-pay

## 2020-02-24 ENCOUNTER — Ambulatory Visit (INDEPENDENT_AMBULATORY_CARE_PROVIDER_SITE_OTHER): Payer: Medicare Other | Admitting: Urology

## 2020-02-24 VITALS — BP 134/71 | HR 81 | Temp 97.9°F | Ht 60.0 in | Wt 130.0 lb

## 2020-02-24 DIAGNOSIS — N3021 Other chronic cystitis with hematuria: Secondary | ICD-10-CM

## 2020-02-24 LAB — URINALYSIS, ROUTINE W REFLEX MICROSCOPIC
Bilirubin, UA: NEGATIVE
Glucose, UA: NEGATIVE
Nitrite, UA: NEGATIVE
Protein,UA: NEGATIVE
Specific Gravity, UA: 1.02 (ref 1.005–1.030)
Urobilinogen, Ur: 0.2 mg/dL (ref 0.2–1.0)
pH, UA: 5.5 (ref 5.0–7.5)

## 2020-02-24 LAB — MICROSCOPIC EXAMINATION: Renal Epithel, UA: NONE SEEN /hpf

## 2020-02-24 MED ORDER — NITROFURANTOIN MONOHYD MACRO 100 MG PO CAPS: 100.0000 mg | ORAL_CAPSULE | Freq: Two times a day (BID) | ORAL | 0 refills | Status: DC

## 2020-02-24 MED ORDER — NITROFURANTOIN MACROCRYSTAL 50 MG PO CAPS: 50.0000 mg | ORAL_CAPSULE | Freq: Every day | ORAL | 3 refills | Status: DC

## 2020-02-24 NOTE — Progress Notes (Signed)
02/24/2020 11:14 AM   Olegario Shearer Jerrel Ivory 09-14-1935 [id number]  Referring provider: [name], MD [address] [place],  [place] [address]  Recurrent UTI  HPI: Ms Lieber is 85yo here for followup recurrent UTI. She has had 2 UTIs since last visit, both sensitive to macrobid. She did not start macrobid 50mg  qhs after last visit. She has urinary urgency, frequency and dysuria for the past week. No hematuria. She is having fecal incontinence which is loose. She has a hx of C Diff and was tested recently and C Diff was negative.   PMH: Past Medical History:  Diagnosis Date  . Allergic rhinitis    with upper airway cough: as of 07/2017 pulm f/u she was instructed to use astelin, flonase, and saline more consistently  . BPPV (benign paroxysmal positional vertigo) 03/2018  . Chest pain, non-cardiac    Cardiac CT showed no signif obstructive dz, mildly elevated calcium level (Dr. [name]).  . Chronic cystitis with hematuria    [name]  . Chronic hypoxemic respiratory failure ([place]) 02/02/2015   2L oxygen 24/7  . Cold hands    NCS/EMGs normal.  Hand dysesthesias per neuro--reassured  . COPD (chronic obstructive pulmonary disease) (HCC)    GOLD 4.  spirometry 01/10/09 FEV 0.97(52%), FEV1% 47.  With chronic bronchitis as of 07/2017.  . DDD (degenerative disc disease), cervical    1998 MRI C5-6 impingemt (left).  MRI 2021 (neurol) 1 level canal stenosis, 1 level foraminal stenosis--->PT  . DDD (degenerative disc disease), lumbar   . Depression with anxiety 04/15/2011  . Diarrheal disease Summer 2017   ? 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).  As of 10/05/15 pt reports GI dx'd her with C diff and rx'd more flagyl and she is also on cholestyramine.  As of 02/2016, pt's sx's resolved completely.  . Diverticulosis of colon    Procto '97 and colonoscopy 2002  .  Dysplastic nevus of upper extremity 04/2014   R tricep ([name])  . Edema of both lower extremities due to peripheral venous insufficiency    Varicosities bilat, swelling L>R.  R baker's cyst.  Got vein clinic eval 02/2018->sclerotherapy recommended but since no hemorrhage or ulceration, insurance will not cover the procedure.  . Family history of adverse reaction to anesthesia    mother died when patient age 37 receiving anesthesia for thyroid surgery  . Fibrocystic breast disease    w/fibroadenoma.  Bx's showed NO atypia (Dr. Margot Chimes).  Mammo neg 2008.  Marland Kitchen GERD (gastroesophageal reflux disease)   . History of double vision    Ophthalmologist, Dr. Herbert Deaner, is further evaluating this with MRI  orbits and limited brain (myesthenia gravis testing neg 07/2010)  . History of home oxygen therapy    uses 2 liters at hs  . Hypertension    EKG 03/2010 normal  . Hypothyroidism    Hashimoto's, dx'd 1989  . Idiopathic peripheral neuropathy 04/2018   Sensory (numb feet) S1 distribution bilat, c/w spinal stenosis sensory neuropathy (no pain). 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  Labs [place], MRI C spine->some spondylosis/stenosis.  UE NCS/EMS: normal 04/2019.  Marland Kitchen Lesion of right native kidney 2017   found on CT 02/2015.  F/u MRI 03/31/15 showed it to be smaller and less likely of any significance but repeat CT renal protocol in 67mo was recommended by radiology.  This was done  10/26/15 and showed no interval change in the lesion, but b/c the lesion enhances with contrast, radiol rec'd urol consult (b/c pap renal RCC could not be excluded).  Saw urol 03/06/16--stable on u/s 09/2016 and 09/2017.  . Osteoarthritis, hip, bilateral    she is s/p bilat THA as of 02/2018.  . Osteoporosis    radius, T score -2.5->rec'd alendronate 08/2019.  Marland Kitchen Pneumonia   . Postmenopausal atrophic vaginitis    improved with estrace 2 X/week.  . Prolapse of vaginal cuff after  hysterectomy    10/2018: Dr. Wayne Both. Mild colpocele 04/2019 per GYN (no surgery)  . Pulmonary nodule    CT chest 12/10, June 2011.  No nodule on CT 06/2010.  Marland Kitchen Recurrent Clostridium difficile diarrhea    Episode 09/19/2016: resolved with prolonged course of flagyl.  11/2016 recurrence treated with 10d of Dificid (Fidaxomicin) by GI (Dr. Earlean Shawl).  . Recurrent UTI    likely related to pt's atrophic vaginitis.  Urol started vaginal estrace 03/2016.  . Rib fracture 09/2018   R 7th, laterally-->sustained when her dog pulled her over and she fell.  Marland Kitchen RLS (restless legs syndrome)    neuro->gabapentin trial 2020/21  . Shingles 02/2014   R abd/flank  . Spinal stenosis of lumbar region with neurogenic claudication    2008 MRI (L3-4, L4-5, L5-S1).  +S1 distribution sensory loss bilat    Surgical History: Past Surgical History:  Procedure Laterality Date  . ABDOMINAL HYSTERECTOMY  1978   no BSO per pt--nonmalignant reasons  . APPENDECTOMY    . BREAST CYST EXCISION     Last screening mammogram 10/2010 was normal.  . CHOLECYSTECTOMY  2001  . COLONOSCOPY  04/2005; 12/06/2015   2007 NORMAL.  2017 adenomatous polyp x 1.  Mod sigmoid diverticulosis (Dr. Earlean Shawl).    Marland Kitchen DEXA  05/18/2017   2019 NORMAL (T-score 0.1).  08/2019->T score -2.5. Rpt 08/2020.  Marland Kitchen HIP CLOSED REDUCTION Right 12/25/2015   Procedure: CLOSED REDUCTION HIP;  Surgeon: Nicholes Stairs, MD;  Location: [facility];  Service: Orthopedics;  Laterality: Right;  . NCV/EMS  10/2018   chronic and symmetric sensorimotor axonal polyneuropathy affecting the lower extremities  . TONSILLECTOMY    . TOTAL HIP ARTHROPLASTY Right 11/01/2014   Procedure: RIGHT TOTAL HIP ARTHROPLASTY;  Surgeon: Latanya Maudlin, MD;  Location: [facility];  Service: Orthopedics;  Laterality: Right;  . TOTAL HIP ARTHROPLASTY Left 12/08/2017   Procedure: LEFT TOTAL HIP ARTHROPLASTY ANTERIOR APPROACH;  Surgeon: Paralee Cancel, MD;  Location: [facility];  Service: Orthopedics;  Laterality:  Left;  70 mins  . TRANSTHORACIC ECHOCARDIOGRAM  08/2014   EF 60-65%, grade I diast dysfxn  . TUBAL LIGATION  1974    Home Medications:  Allergies as of 02/24/2020      Reactions   Losartan Other (See Comments)   hyperkalemia   Penicillins Hives, Swelling, Rash   Has patient had a PCN reaction causing immediate rash, facial/tongue/throat swelling, SOB or lightheadedness with hypotension: YES Has patient had a PCN reaction causing severe rash involving mucus membranes or skin necrosis: NO Has patient had a PCN reaction that required hospitalization NO Has patient had a PCN reaction occurring within the last 10 years: NO If all of the above answers are "NO", then may proceed with Cephalosporin use.   Cefixime [kdc:cefixime] Other (See Comments)   unspecified   Gabapentin Other (See Comments)   Feels woozy   Indocin [indomethacin] Other (See Comments)   Painful tongue and throat   Singulair [montelukast  Sodium]    Chest tightness, decreased mental clarity   Sulfa Antibiotics Other (See Comments)   unspecified   Ace Inhibitors Other (See Comments)   cough   Statins Other (See Comments)   Myalgias      Medication List       Accurate as of February 24, 2020 11:14 AM. If you have any questions, ask your nurse or doctor.        albuterol 108 (90 Base) MCG/ACT inhaler Commonly known as: ProAir HFA Inhale 2 puffs into the lungs every 4 (four) hours as needed for wheezing or shortness of breath.   alendronate 70 MG tablet Commonly known as: FOSAMAX Take 1 tablet (70 mg total) by mouth once a week. Take with a full glass of water on an empty stomach.   amLODipine 5 MG tablet Commonly known as: NORVASC TAKE 1 TABLET(5 MG) BY MOUTH DAILY   aspirin EC 81 MG tablet Take 81 mg by mouth daily.   azelastine 0.1 % nasal spray Commonly known as: ASTELIN Place 2 sprays into both nostrils 2 (two) times daily. Use in each nostril as directed   clobetasol 0.05 % external  solution Commonly known as: TEMOVATE APPLY TO THE AFFECTED AREA EVERY DAY   diazepam 5 MG tablet Commonly known as: VALIUM TAKE 1 TABLET BY MOUTH THREE TIMES DAILY AS NEEDED FOR ANXIETY AND INSOMNIA   fluorometholone 0.1 % ophthalmic suspension Commonly known as: FML Place 1 drop into both eyes as directed. Tapering off   furosemide 20 MG tablet Commonly known as: LASIX TAKE 1 TABLET BY MOUTH EVERY DAY AS NEEDED FOR INCREASED FLUID COLLECTION IN LEGS   mirabegron ER 25 MG Tb24 tablet Commonly known as: MYRBETRIQ Take 1 tablet (25 mg total) by mouth daily.   PROBIOTIC DAILY PO Take 1 capsule by mouth daily.   roflumilast 500 MCG Tabs tablet Commonly known as: DALIRESP Take 1 tablet (500 mcg total) by mouth daily.   Soothe XP Soln Place 1 drop into both eyes 2 (two) times daily as needed (irritation).   Synthroid 88 MCG tablet Generic drug: levothyroxine Take 1 tablet (88 mcg total) by mouth daily.   Trelegy Ellipta 100-62.5-25 MCG/INH Aepb Generic drug: Fluticasone-Umeclidin-Vilant Inhale 1 puff into the lungs daily.   triamcinolone 0.1 % Commonly known as: KENALOG Apply 1 application topically 2 (two) times daily.   venlafaxine 50 MG tablet Commonly known as: EFFEXOR TAKE 1 TABLET BY MOUTH TWICE DAILY   Visine 0.05 % ophthalmic solution Generic drug: tetrahydrozoline 1 drop  as needed   vitamin B-12 100 MCG tablet Commonly known as: CYANOCOBALAMIN Take 100 mcg by mouth daily.       Allergies:  Allergies  Allergen Reactions  . Losartan Other (See Comments)    hyperkalemia  . Penicillins Hives, Swelling and Rash    Has patient had a PCN reaction causing immediate rash, facial/tongue/throat swelling, SOB or lightheadedness with hypotension: YES Has patient had a PCN reaction causing severe rash involving mucus membranes or skin necrosis: NO Has patient had a PCN reaction that required hospitalization NO Has patient had a PCN reaction occurring within the  last 10 years: NO If all of the above answers are "NO", then may proceed with Cephalosporin use.   . Cefixime [Kdc:Cefixime] Other (See Comments)    unspecified  . Gabapentin Other (See Comments)    Feels woozy  . Indocin [Indomethacin] Other (See Comments)    Painful tongue and throat  . Singulair [Montelukast Sodium]  Chest tightness, decreased mental clarity  . Sulfa Antibiotics Other (See Comments)    unspecified  . Ace Inhibitors Other (See Comments)    cough  . Statins Other (See Comments)    Myalgias     Family History: Family History  Problem Relation Age of Onset  . Goiter Mother   . Psoriasis Father        od liver  . Cirrhosis Father   . Diabetes Daughter        Type 2  . Stroke Maternal Grandfather   . Stroke Paternal Grandmother   . Hypertension Paternal Grandmother   . Hernia Paternal Grandmother   . Cancer Paternal Grandfather        lung  . Cancer Maternal Aunt        cancer of the stomach    Social History:  reports that she quit smoking about 30 years ago. Her smoking use included cigarettes. She started smoking about 64 years ago. She has a 51.00 pack-year smoking history. She has never used smokeless tobacco. She reports current alcohol use. She reports that she does not use drugs.  ROS: All other review of systems were reviewed and are negative except what is noted above in HPI  Physical Exam: BP 134/71   Pulse 81   Temp 97.9 F (36.6 C)   Ht 5' (1.524 m)   Wt 130 lb (59 kg)   BMI 25.39 kg/m   Constitutional:  Alert and oriented, No acute distress. HEENT: [place] AT, moist mucus membranes.  Trachea midline, no masses. Cardiovascular: No clubbing, cyanosis, or edema. Respiratory: Normal respiratory effort, no increased work of breathing. GI: Abdomen is soft, nontender, nondistended, no abdominal masses GU: No CVA tenderness.  Lymph: No cervical or inguinal lymphadenopathy. Skin: No rashes, bruises or suspicious lesions. Neurologic:  Grossly intact, no focal deficits, moving all 4 extremities. Psychiatric: Normal mood and affect.  Laboratory Data: Lab Results  Component Value Date   WBC 8.9 05/26/2018   HGB 13.7 05/26/2018   HCT 42.0 05/26/2018   MCV 86.9 05/26/2018   PLT 269.0 05/26/2018    Lab Results  Component Value Date   CREATININE 0.99 01/12/2020    No results found for: PSA  No results found for: TESTOSTERONE  Lab Results  Component Value Date   HGBA1C 5.7 10/29/2011    Urinalysis    Component Value Date/Time   COLORURINE YELLOW 02/12/2020 1013   APPEARANCEUR CLOUDY (A) 02/12/2020 1013   APPEARANCEUR Cloudy (A) 11/25/2019 1319   LABSPEC 1.020 02/12/2020 1013   PHURINE 6.5 02/12/2020 1013   GLUCOSEU NEGATIVE 02/12/2020 1013   GLUCOSEU NEGATIVE 06/14/2015 1452   HGBUR LARGE (A) 02/12/2020 1013   BILIRUBINUR NEGATIVE 02/12/2020 1013   BILIRUBINUR Negative 11/25/2019 1319   KETONESUR NEGATIVE 02/12/2020 1013   PROTEINUR >300 (A) 02/12/2020 1013   UROBILINOGEN 0.2 09/01/2018 1147   UROBILINOGEN 0.2 06/14/2015 1452   NITRITE NEGATIVE 02/12/2020 1013   LEUKOCYTESUR MODERATE (A) 02/12/2020 1013    Lab Results  Component Value Date   LABMICR See below: 11/25/2019   WBCUA 11-30 (A) 11/25/2019   LABEPIT 0-10 11/25/2019   BACTERIA MANY (A) 02/12/2020    Pertinent Imaging:  Results for orders placed during the hospital encounter of 06/06/15  DG Abd 1 View  Narrative CLINICAL DATA:  Anal leakage that she states is not diarhhea x 2 weeks. Pt denies pain or nausea or vomiting. She states she had shingles a couple of months ago but finished up her  antibiotics x 1 month ago.  EXAM: ABDOMEN - 1 VIEW  COMPARISON:  None.  FINDINGS: There is no bowel dilatation to suggest obstruction. There is no evidence of pneumoperitoneum, portal venous gas or pneumatosis. There are no pathologic calcifications along the expected course of the ureters. There is a right total hip arthroplasty.  There is moderate osteoarthritis of the left hip. There is lumbar spine spondylosis.  IMPRESSION: Unremarkable KUB.   Electronically Signed By: Kathreen Devoid On: 06/06/2015 16:07  Results for orders placed during the hospital encounter of 09/02/17  US Venous Img Lower Bilateral  Narrative CLINICAL DATA:  Bilateral lower extremity pain and edema. History of chronic mild venous insufficiency. History of varicose veins. Evaluate for DVT.  EXAM: BILATERAL LOWER EXTREMITY VENOUS DOPPLER ULTRASOUND  TECHNIQUE: Gray-scale sonography with graded compression, as well as color Doppler and duplex ultrasound were performed to evaluate the lower extremity deep venous systems from the level of the common femoral vein and including the common femoral, femoral, profunda femoral, popliteal and calf veins including the posterior tibial, peroneal and gastrocnemius veins when visible. The superficial great saphenous vein was also interrogated. Spectral Doppler was utilized to evaluate flow at rest and with distal augmentation maneuvers in the common femoral, femoral and popliteal veins.  COMPARISON:  None.  FINDINGS: RIGHT LOWER EXTREMITY  Common Femoral Vein: No evidence of thrombus. Normal compressibility, respiratory phasicity and response to augmentation.  Saphenofemoral Junction: No evidence of thrombus. Normal compressibility and flow on color Doppler imaging.  Profunda Femoral Vein: No evidence of thrombus. Normal compressibility and flow on color Doppler imaging.  Femoral Vein: No evidence of thrombus. Normal compressibility, respiratory phasicity and response to augmentation.  Popliteal Vein: No evidence of thrombus. Normal compressibility, respiratory phasicity and response to augmentation.  Calf Veins: No evidence of thrombus. Normal compressibility and flow on color Doppler imaging.  Superficial Great Saphenous Vein: No evidence of thrombus.  Normal compressibility.  Venous Reflux:  None.  Other Findings: There is an approximately 3.8 x 1.6 x 3.3 cm anechoic serpiginous fluid collection with the right popliteal fossa compatible with a Baker cyst.  LEFT LOWER EXTREMITY  Common Femoral Vein: No evidence of thrombus. Normal compressibility, respiratory phasicity and response to augmentation.  Saphenofemoral Junction: No evidence of thrombus. Normal compressibility and flow on color Doppler imaging.  Profunda Femoral Vein: No evidence of thrombus. Normal compressibility and flow on color Doppler imaging.  Femoral Vein: No evidence of thrombus. Normal compressibility, respiratory phasicity and response to augmentation.  Popliteal Vein: No evidence of thrombus. Normal compressibility, respiratory phasicity and response to augmentation.  Calf Veins: No evidence of thrombus. Normal compressibility and flow on color Doppler imaging.  Superficial Great Saphenous Vein: No evidence of thrombus. Normal compressibility.  Venous Reflux:  None.  Other Findings:  None.  IMPRESSION: 1. No evidence of DVT within either lower extremity. 2. Incidentally noted 3.8 cm right-sided Baker's cyst.   Electronically Signed By: Sandi Mariscal M.D. On: 09/02/2017 15:46  No results found for this or any previous visit.  No results found for this or any previous visit.  No results found for this or any previous visit.  No results found for this or any previous visit.  No results found for this or any previous visit.  No results found for this or any previous visit.   Assessment & Plan:    1. Chronic cystitis with hematuria -urine for culture, will call with results -Macrobid 100mg  for 7 days -We will start macrobid  50mg  QHS - Urinalysis, Routine w reflex microscopic   No follow-ups on file.  Nicolette Bang, MD  [facility] [place]

## 2020-02-24 NOTE — Progress Notes (Signed)
Urological Symptom Review  Patient is experiencing the following symptoms: Burning/pain with urination Get up at night to urinate Leakage of urine Trouble starting stream Urinary tract infection   Review of Systems  Gastrointestinal (upper)  : Negative for upper GI symptoms  Gastrointestinal (lower) : Negative for lower GI symptoms  Constitutional : Negative for symptoms  Skin: Skin rash/lesion  itching  Eyes: Blurred vision  (some) Ear/Nose/Throat : Negative for Ear/Nose/Throat symptoms  Hematologic/Lymphatic: Easy bruising  Cardiovascular : Negative for cardiovascular symptoms  Respiratory : Negative for respiratory symptoms  Endocrine: Excessive thirst  Musculoskeletal: Back pain Joint pain  Neurological: Negative for neurological symptoms  Psychologic: Anxiety

## 2020-02-24 NOTE — Patient Instructions (Signed)
Urinary Tract Infection, Adult A urinary tract infection (UTI) is an infection of any part of the urinary tract. The urinary tract includes:  The kidneys.  The ureters.  The bladder.  The urethra. These organs make, store, and get rid of pee (urine) in the body. What are the causes? This infection is caused by germs (bacteria) in your genital area. These germs grow and cause swelling (inflammation) of your urinary tract. What increases the risk? The following factors may make you more likely to develop this condition:  Using a small, thin tube (catheter) to drain pee.  Not being able to control when you pee or poop (incontinence).  Being female. If you are female, these things can increase the risk: ? Using these methods to prevent pregnancy:  A medicine that kills sperm (spermicide).  A device that blocks sperm (diaphragm). ? Having low levels of a female hormone (estrogen). ? Being pregnant. You are more likely to develop this condition if:  You have genes that add to your risk.  You are sexually active.  You take antibiotic medicines.  You have trouble peeing because of: ? A prostate that is bigger than normal, if you are female. ? A blockage in the part of your body that drains pee from the bladder. ? A kidney stone. ? A nerve condition that affects your bladder. ? Not getting enough to drink. ? Not peeing often enough.  You have other conditions, such as: ? Diabetes. ? A weak disease-fighting system (immune system). ? Sickle cell disease. ? Gout. ? Injury of the spine. What are the signs or symptoms? Symptoms of this condition include:  Needing to pee right away.  Peeing small amounts often.  Pain or burning when peeing.  Blood in the pee.  Pee that smells bad or not like normal.  Trouble peeing.  Pee that is cloudy.  Fluid coming from the vagina, if you are female.  Pain in the belly or lower back. Other symptoms include:  Vomiting.  Not  feeling hungry.  Feeling mixed up (confused). This may be the first symptom in older adults.  Being tired and grouchy (irritable).  A fever.  Watery poop (diarrhea). How is this treated?  Taking antibiotic medicine.  Taking other medicines.  Drinking enough water. In some cases, you may need to see a specialist. Follow these instructions at home: Medicines  Take over-the-counter and prescription medicines only as told by your doctor.  If you were prescribed an antibiotic medicine, take it as told by your doctor. Do not stop taking it even if you start to feel better. General instructions  Make sure you: ? Pee until your bladder is empty. ? Do not hold pee for a long time. ? Empty your bladder after sex. ? Wipe from front to back after peeing or pooping if you are a female. Use each tissue one time when you wipe.  Drink enough fluid to keep your pee pale yellow.  Keep all follow-up visits.   Contact a doctor if:  You do not get better after 1-2 days.  Your symptoms go away and then come back. Get help right away if:  You have very bad back pain.  You have very bad pain in your lower belly.  You have a fever.  You have chills.  You feeling like you will vomit or you vomit. Summary  A urinary tract infection (UTI) is an infection of any part of the urinary tract.  This condition is caused by   germs in your genital area.  There are many risk factors for a UTI.  Treatment includes antibiotic medicines.  Drink enough fluid to keep your pee pale yellow. This information is not intended to replace advice given to you by your health care provider. Make sure you discuss any questions you have with your health care provider. Document Revised: 09/02/2019 Document Reviewed: 09/02/2019 Elsevier Patient Education  2021 [employer]  

## 2020-02-26 LAB — URINE CULTURE: Organism ID, Bacteria: NO GROWTH

## 2020-02-27 ENCOUNTER — Ambulatory Visit (INDEPENDENT_AMBULATORY_CARE_PROVIDER_SITE_OTHER): Payer: Medicare Other | Admitting: Family Medicine

## 2020-02-27 ENCOUNTER — Other Ambulatory Visit: Payer: Self-pay

## 2020-02-27 ENCOUNTER — Encounter: Payer: Self-pay | Admitting: Family Medicine

## 2020-02-27 VITALS — BP 116/65 | HR 98 | Temp 97.4°F | Resp 16 | Ht 60.0 in | Wt 131.2 lb

## 2020-02-27 DIAGNOSIS — N390 Urinary tract infection, site not specified: Secondary | ICD-10-CM | POA: Diagnosis not present

## 2020-02-27 NOTE — Progress Notes (Signed)
OFFICE VISIT  02/27/2020  CC:  Chief Complaint  Patient presents with  . Follow-up    UTI    HPI:    Patient is a 85 y.o. Caucasian female who presents for recurrent UTIs. On 02/12/20 went to ED for UTI->cipro.  Urine culture showed NO GROWTH. Followed up with urol 02/24/20, was still symptomatic, treated with macrobid and rx'd daily prophylactic nitrofurantoin 50mg  at that time.  Currently says her dysuria is almost completely gone, essentially feels like her bladder sx's are back to baseline.  Sounds like urgency/frequency responds some to myrbetriq but still with her good days and bad days.  Has some fecal leakage--brownish smears on underwear.   Her BMs are formed--has one BM every morning.  No anal pain and no blood in stool. Has plans to see GI provider soon for this [facility]).    Feeling well overall. No abd pain, no fevers, no n/v   Past Medical History:  Diagnosis Date  . Allergic rhinitis    with upper airway cough: as of 07/2017 pulm f/u she was instructed to use astelin, flonase, and saline more consistently  . BPPV (benign paroxysmal positional vertigo) 03/2018  . Chest pain, non-cardiac    Cardiac CT showed no signif obstructive dz, mildly elevated calcium level (Dr. [name]).  . Chronic cystitis with hematuria    [name]  . Chronic hypoxemic respiratory failure ([place]) 02/02/2015   2L oxygen 24/7  . Cold hands    NCS/EMGs normal.  Hand dysesthesias per neuro--reassured  . COPD (chronic obstructive pulmonary disease) (HCC)    GOLD 4.  spirometry 01/10/09 FEV 0.97(52%), FEV1% 47.  With chronic bronchitis as of 07/2017.  . DDD (degenerative disc disease), cervical    1998 MRI C5-6 impingemt (left).  MRI 2021 (neurol) 1 level canal stenosis, 1 level foraminal stenosis--->PT  . DDD (degenerative disc disease), lumbar   . Depression with anxiety 04/15/2011  . Diarrheal disease Summer 2017   ? refractor C diff vs post infectious diarrhea predominant  syndrome--GI told her to avoid lactose, sorbitol, and caffeine (08/30/15--[name]).  As of 10/05/15 pt reports GI dx'd her with C diff and rx'd more flagyl and she is also on cholestyramine.  As of 02/2016, pt's sx's resolved completely.  . Diverticulosis of colon    Procto '97 and colonoscopy 2002  . Dysplastic nevus of upper extremity 04/2014   R tricep ([name])  . Edema of both lower extremities due to peripheral venous insufficiency    Varicosities bilat, swelling L>R.  R baker's cyst.  Got vein clinic eval 02/2018->sclerotherapy recommended but since no hemorrhage or ulceration, insurance will not cover the procedure.  . Family history of adverse reaction to anesthesia    mother died when patient age 37 receiving anesthesia for thyroid surgery  . Fibrocystic breast disease    w/fibroadenoma.  Bx's showed NO atypia (Dr. Margot Chimes).  Mammo neg 2008.  Marland Kitchen GERD (gastroesophageal reflux disease)   . History of double vision    Ophthalmologist, Dr. Herbert Deaner, is further evaluating this with MRI  orbits and limited brain (myesthenia gravis testing neg 07/2010)  . History of home oxygen therapy    uses 2 liters at hs  . Hypertension    EKG 03/2010 normal  . Hypothyroidism    Hashimoto's, dx'd 1989  . Idiopathic peripheral neuropathy 04/2018   Sensory (numb feet) S1 distribution bilat, c/w spinal stenosis sensory neuropathy (no pain). 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  Labs [place], MRI C spine->some spondylosis/stenosis.  UE NCS/EMS: normal 04/2019.  Marland Kitchen Lesion of right native kidney 2017   found on CT 02/2015.  F/u MRI 03/31/15 showed it to be smaller and less likely of any significance but repeat CT renal protocol in 70mo was recommended by radiology.  This was done 10/26/15 and showed no interval change in the lesion, but b/c the lesion enhances with contrast, radiol rec'd urol consult (b/c pap renal RCC could not be excluded).  Saw urol  03/06/16--stable on u/s 09/2016 and 09/2017.  . Osteoarthritis, hip, bilateral    she is s/p bilat THA as of 02/2018.  . Osteoporosis    radius, T score -2.5->rec'd alendronate 08/2019.  Marland Kitchen Pneumonia   . Postmenopausal atrophic vaginitis    improved with estrace 2 X/week.  . Prolapse of vaginal cuff after hysterectomy    10/2018: Dr. Wayne Both. Mild colpocele 04/2019 per GYN (no surgery)  . Pulmonary nodule    CT chest 12/10, June 2011.  No nodule on CT 06/2010.  Marland Kitchen Recurrent Clostridium difficile diarrhea    Episode 09/19/2016: resolved with prolonged course of flagyl.  11/2016 recurrence treated with 10d of Dificid (Fidaxomicin) by GI (Dr. Earlean Shawl).  . Recurrent UTI    likely related to pt's atrophic vaginitis.  Urol started vaginal estrace 03/2016.  . Rib fracture 09/2018   R 7th, laterally-->sustained when her dog pulled her over and she fell.  Marland Kitchen RLS (restless legs syndrome)    neuro->gabapentin trial 2020/21  . Shingles 02/2014   R abd/flank  . Spinal stenosis of lumbar region with neurogenic claudication    2008 MRI (L3-4, L4-5, L5-S1).  +S1 distribution sensory loss bilat    Past Surgical History:  Procedure Laterality Date  . ABDOMINAL HYSTERECTOMY  1978   no BSO per pt--nonmalignant reasons  . APPENDECTOMY    . BREAST CYST EXCISION     Last screening mammogram 10/2010 was normal.  . CHOLECYSTECTOMY  2001  . COLONOSCOPY  04/2005; 12/06/2015   2007 NORMAL.  2017 adenomatous polyp x 1.  Mod sigmoid diverticulosis (Dr. Earlean Shawl).    Marland Kitchen DEXA  05/18/2017   2019 NORMAL (T-score 0.1).  08/2019->T score -2.5. Rpt 08/2020.  Marland Kitchen HIP CLOSED REDUCTION Right 12/25/2015   Procedure: CLOSED REDUCTION HIP;  Surgeon: Nicholes Stairs, MD;  Location: [facility];  Service: Orthopedics;  Laterality: Right;  . NCV/EMS  10/2018   chronic and symmetric sensorimotor axonal polyneuropathy affecting the lower extremities  . TONSILLECTOMY    . TOTAL HIP ARTHROPLASTY Right 11/01/2014   Procedure: RIGHT TOTAL HIP  ARTHROPLASTY;  Surgeon: Latanya Maudlin, MD;  Location: [facility];  Service: Orthopedics;  Laterality: Right;  . TOTAL HIP ARTHROPLASTY Left 12/08/2017   Procedure: LEFT TOTAL HIP ARTHROPLASTY ANTERIOR APPROACH;  Surgeon: Paralee Cancel, MD;  Location: [facility];  Service: Orthopedics;  Laterality: Left;  70 mins  . TRANSTHORACIC ECHOCARDIOGRAM  08/2014   EF 60-65%, grade I diast dysfxn  . TUBAL LIGATION  1974    Outpatient Medications Prior to Visit  Medication Sig Dispense Refill  . albuterol (PROAIR HFA) 108 (90 Base) MCG/ACT inhaler Inhale 2 puffs into the lungs every 4 (four) hours as needed for wheezing or shortness of breath. 8.5 g 1  . alendronate (FOSAMAX) 70 MG tablet Take 1 tablet (70 mg total) by mouth once a week. Take with a full glass of water on an empty stomach. 12 tablet 3  . amLODipine (NORVASC) 5 MG tablet TAKE 1  TABLET(5 MG) BY MOUTH DAILY 90 tablet 0  . Artificial Tear Solution (SOOTHE XP) SOLN Place 1 drop into both eyes 2 (two) times daily as needed (irritation).    Marland Kitchen aspirin EC 81 MG tablet Take 81 mg by mouth daily.    Marland Kitchen azelastine (ASTELIN) 0.1 % nasal spray Place 2 sprays into both nostrils 2 (two) times daily. Use in each nostril as directed 30 mL 3  . clobetasol (TEMOVATE) 0.05 % external solution APPLY TO THE AFFECTED AREA EVERY DAY    . diazepam (VALIUM) 5 MG tablet TAKE 1 TABLET BY MOUTH THREE TIMES DAILY AS NEEDED FOR ANXIETY AND INSOMNIA 90 tablet 5  . fluorometholone (FML) 0.1 % ophthalmic suspension Place 1 drop into both eyes as directed. Tapering off    . Fluticasone-Umeclidin-Vilant (TRELEGY ELLIPTA) 100-62.5-25 MCG/INH AEPB Inhale 1 puff into the lungs daily. 60 each 12  . mirabegron ER (MYRBETRIQ) 25 MG TB24 tablet Take 1 tablet (25 mg total) by mouth daily. 30 tablet 3  . nitrofurantoin (MACRODANTIN) 50 MG capsule Take 1 capsule (50 mg total) by mouth at bedtime. 90 capsule 3  . nitrofurantoin, macrocrystal-monohydrate, (MACROBID) 100 MG capsule Take 1 capsule  (100 mg total) by mouth every 12 (twelve) hours. 14 capsule 0  . Probiotic Product (PROBIOTIC DAILY PO) Take 1 capsule by mouth daily.     . roflumilast (DALIRESP) 500 MCG TABS tablet Take 1 tablet (500 mcg total) by mouth daily. 30 tablet 11  . SYNTHROID 88 MCG tablet Take 1 tablet (88 mcg total) by mouth daily. 30 tablet 5  . triamcinolone cream (KENALOG) 0.1 % Apply 1 application topically 2 (two) times daily. 30 g 0  . venlafaxine (EFFEXOR) 50 MG tablet TAKE 1 TABLET BY MOUTH TWICE DAILY 180 tablet 1  . VISINE 0.05 % ophthalmic solution 1 drop  as needed    . vitamin B-12 (CYANOCOBALAMIN) 100 MCG tablet Take 100 mcg by mouth daily.    . furosemide (LASIX) 20 MG tablet TAKE 1 TABLET BY MOUTH EVERY DAY AS NEEDED FOR INCREASED FLUID COLLECTION IN LEGS (Patient not taking: Reported on 02/27/2020) 30 tablet 3   No facility-administered medications prior to visit.    Allergies  Allergen Reactions  . Losartan Other (See Comments)    hyperkalemia  . Penicillins Hives, Swelling and Rash    Has patient had a PCN reaction causing immediate rash, facial/tongue/throat swelling, SOB or lightheadedness with hypotension: YES Has patient had a PCN reaction causing severe rash involving mucus membranes or skin necrosis: NO Has patient had a PCN reaction that required hospitalization NO Has patient had a PCN reaction occurring within the last 10 years: NO If all of the above answers are "NO", then may proceed with Cephalosporin use.   . Cefixime [Kdc:Cefixime] Other (See Comments)    unspecified  . Gabapentin Other (See Comments)    Feels woozy  . Indocin [Indomethacin] Other (See Comments)    Painful tongue and throat  . Singulair [Montelukast Sodium]     Chest tightness, decreased mental clarity  . Sulfa Antibiotics Other (See Comments)    unspecified  . Ace Inhibitors Other (See Comments)    cough  . Statins Other (See Comments)    Myalgias     ROS As per HPI  PE: Vitals with BMI  02/27/2020 02/24/2020 02/12/2020  Height 5\' 0"  5\' 0"  5\' 0"   Weight 131 lbs 3 oz 130 lbs 130 lbs  BMI 25.62 123456 123456  Systolic [id number]  Diastolic 65 71 90  Pulse 98 81 81  Some encounter information is confidential and restricted. Go to Review Flowsheets activity to see all data.     Gen: Alert, well appearing.  Patient is oriented to person, place, time, and situation. AFFECT: pleasant, lucid thought and speech. CV: RRR, no m/r/g.   LUNGS: CTA bilat, nonlabored resps, good aeration in all lung fields. EXT: no clubbing or cyanosis.  Trace bilat LL pitting edema.    LABS:    Chemistry      Component Value Date/Time   NA 142 01/12/2020 1206   K 4.3 01/12/2020 1206   CL 104 01/12/2020 1206   CO2 31 01/12/2020 1206   BUN 19 01/12/2020 1206   CREATININE 0.99 01/12/2020 1206   CREATININE 0.84 02/27/2016 1533      Component Value Date/Time   CALCIUM 9.7 01/12/2020 1206   ALKPHOS 87 12/08/2019 1131   AST 13 12/08/2019 1131   ALT 9 12/08/2019 1131   BILITOT 0.5 12/08/2019 1131     Lab Results  Component Value Date   WBC 8.9 05/26/2018   HGB 13.7 05/26/2018   HCT 42.0 05/26/2018   MCV 86.9 05/26/2018   PLT 269.0 05/26/2018   Lab Results  Component Value Date   TSH 2.09 05/11/2019    IMPRESSION AND PLAN:  1) Recurrent UTI in the setting of pelvic floor weakening/vag prolapse. Most recent acute infection now resolved. She'll start nitrofurantoin 50mg  qd now---as per urol instructions.  2) Fecal leakage: suspect secondary to mild dec anal sphincter tone from pelvic floor weakening. She does well to try to keep clean and dec potential for cross contamination of vaginal/urethral region. Keep plan for f/u with GI.  An After Visit Summary was printed and given to the patient.  FOLLOW UP: No follow-ups on file. 05/09/20 appt with me  Signed:  Crissie Sickles, MD           02/27/2020

## 2020-03-01 ENCOUNTER — Telehealth: Payer: Self-pay

## 2020-03-01 NOTE — Telephone Encounter (Signed)
-----   Message from Cleon Gustin, MD sent at 02/28/2020 11:04 AM EST ----- negative ----- Message ----- From: Dorisann Frames, RN Sent: 02/27/2020  10:30 AM EST To: Cleon Gustin, MD  Please review

## 2020-03-01 NOTE — Telephone Encounter (Signed)
Left message to return call for results

## 2020-03-05 NOTE — Telephone Encounter (Signed)
Pt called back  and was notified of culture results.

## 2020-03-06 ENCOUNTER — Other Ambulatory Visit: Payer: Self-pay | Admitting: Family Medicine

## 2020-03-06 DIAGNOSIS — R151 Fecal smearing: Secondary | ICD-10-CM | POA: Diagnosis not present

## 2020-03-08 ENCOUNTER — Encounter: Payer: Self-pay | Admitting: Physician Assistant

## 2020-03-08 ENCOUNTER — Ambulatory Visit (INDEPENDENT_AMBULATORY_CARE_PROVIDER_SITE_OTHER): Payer: Medicare Other | Admitting: Physician Assistant

## 2020-03-08 ENCOUNTER — Other Ambulatory Visit: Payer: Self-pay

## 2020-03-08 DIAGNOSIS — L820 Inflamed seborrheic keratosis: Secondary | ICD-10-CM | POA: Diagnosis not present

## 2020-03-08 DIAGNOSIS — L739 Follicular disorder, unspecified: Secondary | ICD-10-CM | POA: Diagnosis not present

## 2020-03-08 DIAGNOSIS — Z1283 Encounter for screening for malignant neoplasm of skin: Secondary | ICD-10-CM

## 2020-03-08 DIAGNOSIS — Z87898 Personal history of other specified conditions: Secondary | ICD-10-CM

## 2020-03-08 DIAGNOSIS — Z86018 Personal history of other benign neoplasm: Secondary | ICD-10-CM | POA: Diagnosis not present

## 2020-03-08 DIAGNOSIS — Z85828 Personal history of other malignant neoplasm of skin: Secondary | ICD-10-CM

## 2020-03-08 MED ORDER — CLOBETASOL PROPIONATE 0.05 % EX SOLN: CUTANEOUS | 4 refills | Status: DC

## 2020-03-12 ENCOUNTER — Other Ambulatory Visit: Payer: Self-pay | Admitting: *Deleted

## 2020-03-12 NOTE — Patient Outreach (Signed)
[facility]) Care Management  03/12/2020  Priscilla Cantrell 1935-08-24 [id number]    RN Health Coach attempted follow up outreach call to patient.  Patient was unavailable. No voicemail message pick. Plan: RN will call patient again within 30 days.  [place] [facility] [phone]

## 2020-03-20 ENCOUNTER — Other Ambulatory Visit: Payer: Self-pay | Admitting: *Deleted

## 2020-03-20 ENCOUNTER — Other Ambulatory Visit: Payer: Self-pay | Admitting: Family Medicine

## 2020-03-20 NOTE — Patient Instructions (Signed)
Goals Addressed            This Visit's Progress   . (THN)Make and Keep All Appointments   On track    Timeframe:  Long-Range Goal Priority:  Medium Start Date:   [id number]                          Expected End Date:  95621308                    Follow Up Date [id number]   - call to cancel if needed - keep a calendar with prescription refill dates - keep a calendar with appointment dates    Why is this important?   Part of staying healthy is seeing the doctor for follow-up care.  If you forget your appointments, there are some things you can do to stay on track.    Notes:     . (THN)Manage Fatigue (Tiredness)   On track    Timeframe:  Long-Range Goal Priority:  Medium Start Date: 29528413                            Expected End Date:  24401027                    Follow Up Date 25366440   - eat healthy - get outdoors every day (weather permitting) - use devices that will help like a cane, sock-puller or reacher    Why is this important?   Feeling tired or worn out is a common symptom of COPD (chronic obstructive pulmonary disease).  Learning when you feel your best and when you need rest is important.  Managing the tiredness (fatigue) will help you be active and enjoy life.     Notes:     . [facility] My Emotions   On track    Timeframe:  Long-Range Goal Priority:  Medium Start Date:    34742595                         Expected End Date:      63875643                Follow Up Date 32951884   - talk about feelings with a friend, family or spiritual advisor    Why is this important?   When you are stressed, down or upset, your body reacts too.  For example, your blood pressure may get higher; you may have a headache or stomachache.  When your emotions get the best of you, your body's ability to fight off cold and flu gets weak.  These steps will help you manage your emotions.     Notes:  Patient daughter will be staying with her during recovery time.  Patient  daughter has left/ Patient is feeling some lonliness    . (THN)Track and Manage My Symptoms   On track    Timeframe:  Long-Range Goal Priority:  Medium Start Date:   16606301                          Expected End Date:    60109323                  Follow Up Date 55732202 - develop a rescue plan - eliminate symptom triggers at home - follow rescue  plan if symptoms flare-up - keep follow-up appointments    Why is this important?   Tracking your symptoms and other information about your health helps your doctor plan your care.  Write down the symptoms, the time of day, what you were doing and what medicine you are taking.  You will soon learn how to manage your symptoms.     Notes:     . (THN)Track and Manage My Triggers   On track    Timeframe:  Long-Range Goal Priority:  Medium Start Date: 16109604                            Expected End Date:   54098119                   Follow Up Date 14782956 - identify and remove indoor air pollutants - limit outdoor activity during cold weather - listen for public air quality announcements every day    Why is this important?   Triggers are activities or things, like tobacco smoke or cold weather, that make your COPD (chronic obstructive pulmonary disease) flare-up.  Knowing these triggers helps you plan how to stay away from them.  When you cannot remove them, you can learn how to manage them.     Notes:

## 2020-03-20 NOTE — Patient Outreach (Signed)
[facility]) Care Management  [place]  03/20/2020   Priscilla Cantrell 19-Nov-1935 [id number]  [name] received telephone call from patient.  [employer] compliance verified. Per patient she is doing good. Patient stated her breathing is better. Patient is not coughing. Per patient she is only using her oxygen at night and when doing exertional activity such as mopping. Patient had a recent fall and skinned elbows. Patient has agreed to follow up outreach.    Encounter Medications:  Outpatient Encounter Medications as of 03/20/2020  Medication Sig  . albuterol (PROAIR HFA) 108 (90 Base) MCG/ACT inhaler Inhale 2 puffs into the lungs every 4 (four) hours as needed for wheezing or shortness of breath.  Marland Kitchen alendronate (FOSAMAX) 70 MG tablet Take 1 tablet (70 mg total) by mouth once a week. Take with a full glass of water on an empty stomach.  Marland Kitchen amLODipine (NORVASC) 5 MG tablet TAKE 1 TABLET(5 MG) BY MOUTH DAILY  . Artificial Tear Solution (SOOTHE XP) SOLN Place 1 drop into both eyes 2 (two) times daily as needed (irritation).  Marland Kitchen aspirin EC 81 MG tablet Take 81 mg by mouth daily.  Marland Kitchen azelastine (ASTELIN) 0.1 % nasal spray Place 2 sprays into both nostrils 2 (two) times daily. Use in each nostril as directed  . clobetasol (TEMOVATE) 0.05 % external solution APPLY TO THE AFFECTED AREA EVERY DAY  . diazepam (VALIUM) 5 MG tablet TAKE 1 TABLET BY MOUTH THREE TIMES DAILY AS NEEDED FOR ANXIETY AND INSOMNIA  . fluorometholone (FML) 0.1 % ophthalmic suspension Place 1 drop into both eyes as directed. Tapering off  . Fluticasone-Umeclidin-Vilant (TRELEGY ELLIPTA) 100-62.5-25 MCG/INH AEPB Inhale 1 puff into the lungs daily.  . furosemide (LASIX) 20 MG tablet TAKE 1 TABLET BY MOUTH EVERY DAY AS NEEDED FOR INCREASED FLUID COLLECTION IN LEGS  . mirabegron ER (MYRBETRIQ) 25 MG TB24 tablet Take 1 tablet (25 mg total) by mouth daily.  . nitrofurantoin (MACRODANTIN) 50 MG capsule Take 1  capsule (50 mg total) by mouth at bedtime.  . nitrofurantoin, macrocrystal-monohydrate, (MACROBID) 100 MG capsule Take 1 capsule (100 mg total) by mouth every 12 (twelve) hours.  . Probiotic Product (PROBIOTIC DAILY PO) Take 1 capsule by mouth daily.   . roflumilast (DALIRESP) 500 MCG TABS tablet Take 1 tablet (500 mcg total) by mouth daily.  Marland Kitchen SYNTHROID 88 MCG tablet TAKE 1 TABLET(88 MCG) BY MOUTH DAILY  . triamcinolone cream (KENALOG) 0.1 % Apply 1 application topically 2 (two) times daily.  Marland Kitchen venlafaxine (EFFEXOR) 50 MG tablet TAKE 1 TABLET BY MOUTH TWICE DAILY  . VISINE 0.05 % ophthalmic solution 1 drop  as needed  . vitamin B-12 (CYANOCOBALAMIN) 100 MCG tablet Take 100 mcg by mouth daily.   No facility-administered encounter medications on file as of 03/20/2020.    Functional Status:  In your present state of health, do you have any difficulty performing the following activities: 02/01/2020 04/05/2019  Hearing? N N  Vision? N N  Difficulty concentrating or making decisions? N -  Walking or climbing stairs? N N  Dressing or bathing? N N  Doing errands, shopping? N N  Preparing Food and eating ? N N  Using the Toilet? N N  In the past six months, have you accidently leaked urine? N Y  Comment - at night when sleeping  Do you have problems with loss of bowel control? Y N  Comment occasionally -  Managing your Medications? N N  Managing your Finances?  N Y  Comment - for medications  Housekeeping or managing your Housekeeping? N N  Some recent data might be hidden    Fall/Depression Screening: Fall Risk  03/20/2020 02/01/2020 12/20/2019  Falls in the past year? 1 1 1   Number falls in past yr: 0 0 0  Injury with Fall? 1 1 1   Comment - - -  Risk for fall due to : History of fall(s) - History of fall(s)  Risk for fall due to: Comment - - -  Follow up - Falls prevention discussed Falls evaluation completed  Comment - - -   PHQ 2/9 Scores 02/01/2020 01/12/2020 04/05/2019 01/19/2019  11/03/2018 11/03/2018 08/20/2018  PHQ - 2 Score 1 0 1 0 0 0 0  PHQ- 9 Score - - - - - - -    Assessment:  Goals Addressed            This Visit's Progress   . (THN)Make and Keep All Appointments   On track    Timeframe:  Long-Range Goal Priority:  Medium Start Date:   62836629                          Expected End Date:  47654650                    Follow Up Date 35465681   - call to cancel if needed - keep a calendar with prescription refill dates - keep a calendar with appointment dates    Why is this important?   Part of staying healthy is seeing the doctor for follow-up care.  If you forget your appointments, there are some things you can do to stay on track.    Notes:     . (THN)Manage Fatigue (Tiredness)   On track    Timeframe:  Long-Range Goal Priority:  Medium Start Date: 27517001                            Expected End Date:  74944967                    Follow Up Date 59163846   - eat healthy - get outdoors every day (weather permitting) - use devices that will help like a cane, sock-puller or reacher    Why is this important?   Feeling tired or worn out is a common symptom of COPD (chronic obstructive pulmonary disease).  Learning when you feel your best and when you need rest is important.  Managing the tiredness (fatigue) will help you be active and enjoy life.     Notes:     . [facility] My Emotions   On track    Timeframe:  Long-Range Goal Priority:  Medium Start Date:    65993570                         Expected End Date:      17793903                Follow Up Date 00923300   - talk about feelings with a friend, family or spiritual advisor    Why is this important?   When you are stressed, down or upset, your body reacts too.  For example, your blood pressure may get higher; you may have a headache or stomachache.  When your emotions get the best of  you, your body's ability to fight off cold and flu gets weak.  These steps will help you manage  your emotions.     Notes:  Patient daughter will be staying with her during recovery time.  Patient daughter has left/ Patient is feeling some lonliness    . (THN)Track and Manage My Symptoms   On track    Timeframe:  Long-Range Goal Priority:  Medium Start Date:   07680881                          Expected End Date:    10315945                  Follow Up Date 85929244 - develop a rescue plan - eliminate symptom triggers at home - follow rescue plan if symptoms flare-up - keep follow-up appointments    Why is this important?   Tracking your symptoms and other information about your health helps your doctor plan your care.  Write down the symptoms, the time of day, what you were doing and what medicine you are taking.  You will soon learn how to manage your symptoms.     Notes:     . (THN)Track and Manage My Triggers   On track    Timeframe:  Long-Range Goal Priority:  Medium Start Date: 62863817                            Expected End Date:   71165790                   Follow Up Date 38333832 - identify and remove indoor air pollutants - limit outdoor activity during cold weather - listen for public air quality announcements every day    Why is this important?   Triggers are activities or things, like tobacco smoke or cold weather, that make your COPD (chronic obstructive pulmonary disease) flare-up.  Knowing these triggers helps you plan how to stay away from them.  When you cannot remove them, you can learn how to manage them.     Notes:        Plan:  Follow-up:  Patient agrees to Care Plan and Follow-up.  Patient will use oxygen only at HS and during exertional activities Provided Calendar book RN assisted patient in getting government free [place] will follow up within the month of May  [facility] Buikema [place] [facility] [phone]

## 2020-03-26 ENCOUNTER — Encounter: Payer: Self-pay | Admitting: Physician Assistant

## 2020-03-26 NOTE — Progress Notes (Signed)
   Follow-Up Visit   Subjective  Priscilla Cantrell is a 85 y.o. female who presents for the following: Annual Exam ([place]).   The following portions of the chart were reviewed this encounter and updated as appropriate:  Tobacco  Allergies  Meds  Problems  Med Hx  Surg Hx  Fam Hx      Objective  Well appearing patient in no apparent distress; mood and affect are within normal limits.  A full examination was performed including scalp, head, eyes, ears, nose, lips, neck, chest, axillae, abdomen, back, buttocks, bilateral upper extremities, bilateral lower extremities, hands, feet, fingers, toes, fingernails, and toenails. All findings within normal limits unless otherwise noted below.  Objective  Left Breast: Waist up skin examination  Objective  Right Anterior Thigh: White scar- clear  Objective  Right Tricep: White scar -clear  Objective  Mid Back (12): Erythematous stuck-on, waxy papule or plaque.   Objective  Mid Frontal Scalp:    Assessment & Plan  Encounter for screening for malignant neoplasm of skin Left Breast  Yearly skin check  History of squamous cell carcinoma of skin Right Anterior Thigh  Yearly skin check  History of atypical nevus Right Tricep  Yearly  skin check  Seborrheic keratosis, inflamed (12) Mid Back  Destruction of lesion - Mid Back Complexity: simple   Destruction method: cryotherapy   Informed consent: discussed and consent obtained   Timeout:  patient name, date of birth, surgical site, and procedure verified Lesion destroyed using liquid nitrogen: Yes   Cryotherapy cycles:  3 Outcome: patient tolerated procedure well with no complications    Folliculitis Mid Frontal Scalp  clobetasol (TEMOVATE) 0.05 % external solution - Mid Frontal Scalp    I, Nishika Parkhurst, PA-C, have reviewed all documentation's for this visit.  The documentation on 03/26/20 for the exam, diagnosis, procedures and orders are  all accurate and complete.

## 2020-03-30 ENCOUNTER — Encounter: Payer: Self-pay | Admitting: Pulmonary Disease

## 2020-03-30 ENCOUNTER — Ambulatory Visit (INDEPENDENT_AMBULATORY_CARE_PROVIDER_SITE_OTHER): Payer: Medicare Other | Admitting: Pulmonary Disease

## 2020-03-30 ENCOUNTER — Other Ambulatory Visit: Payer: Self-pay

## 2020-03-30 VITALS — BP 116/78 | HR 84 | Temp 98.1°F | Ht 60.0 in | Wt 132.0 lb

## 2020-03-30 DIAGNOSIS — R058 Other specified cough: Secondary | ICD-10-CM

## 2020-03-30 DIAGNOSIS — J449 Chronic obstructive pulmonary disease, unspecified: Secondary | ICD-10-CM

## 2020-03-30 DIAGNOSIS — J9611 Chronic respiratory failure with hypoxia: Secondary | ICD-10-CM

## 2020-03-30 NOTE — Progress Notes (Signed)
[facility]  Chief Complaint  Patient presents with  . Follow-up    2 L at night. Trelegy working well.  DME- [employer]    Constitutional:  BP 116/78 (BP Location: Right Arm, Cuff Size: Normal)   Pulse 84   Temp 98.1 F (36.7 C)   Ht 5' (1.524 m)   Wt 132 lb (59.9 kg)   SpO2 93%   BMI 25.78 kg/m   Past Medical History:  Chronic cystitis, DDD, Anxiety, Depression, C diff 2017 and 2018, Diverticulosis, GERD, HTN, Hypothyroidism, PNA, Shingles, Spinal stenosis  Past Surgical History:  She  has a past surgical history that includes Cholecystectomy (2001); Tubal ligation (1974); Abdominal hysterectomy (1978); Breast cyst excision; Appendectomy; Tonsillectomy; Colonoscopy (04/2005; 12/06/2015); transthoracic echocardiogram (08/2014); Total hip arthroplasty (Right, 11/01/2014); Hip Closed Reduction (Right, 12/25/2015); DEXA (05/18/2017); Total hip arthroplasty (Left, 12/08/2017); and NCV/EMS (10/2018).  Brief Summary:  Priscilla Cantrell is a 85 y.o. female former smoker with GOLD 4 COPD, ACE cough, and nocturnal hypoxemia.       Subjective:   Breathing has been okay.  Not having cough, wheeze, or sputum.  Gets sinus congestion and post nasal drip, especially in the morning.  She is worried about her daughter and best friend who both still smoke.  Her dog help keeps her active.  She stays engaged with church activities.  Physical Exam:   Appearance - well kempt   ENMT - no sinus tenderness, no oral exudate, no LAN, Mallampati 2 airway, no stridor  Respiratory - equal breath sounds bilaterally, no wheezing or rales  CV - s1s2 regular rate and rhythm, no murmurs  Ext - no clubbing, no edema  Skin - no rashes  Psych - normal mood and affect   Pulmonary testing:   Spirometry 01/10/09>>FEV1 0.97(52%), FEV1% 47  March 2012>>Daliresp started  RAST 05/04/17 >> negative, IgE 8  Spirometry 05/14/17 >> FEV1 0.91 (55%), FEV1% 48  PFT  10/29/18 >> FEV1 1.05 (68%), FEV1% 52, TLC 4.29 (96%), DLCO 44%  Sleep Tests:   PSG 11/14/11 >> AHI 0.3  ONO with RA 07/31/16 >>test time 6 hrs 41 min. Average SpO2 87%, low SpO2 74%. Spent 4 hrs 13 min with SpO2 <88%.  Cardiac Tests:   Echo 08/30/14 >> EF 60 to 93%, grade 1 diastolic dysfx, PAS 24 mmHG  Social History:  She  reports that she quit smoking about 30 years ago. Her smoking use included cigarettes. She started smoking about 64 years ago. She has a 51.00 pack-year smoking history. She has never used smokeless tobacco. She reports current alcohol use. She reports that she does not use drugs.  Family History:  Her family history includes Cancer in her maternal aunt and paternal grandfather; Cirrhosis in her father; Diabetes in her daughter; Goiter in her mother; Hernia in her paternal grandmother; Hypertension in her paternal grandmother; Psoriasis in her father; Stroke in her maternal grandfather and paternal grandmother.     Assessment/Plan:   GOLD 4 COPD with chronic bronchitis. - continue trelegy, daliresp and prn albuterol  Allergic rhinitis with upper airway cough. - continue nasal irrigation - prn azelastine and fluticasone when her symptoms get worse  Chronic respiratory failure with hypoxia. - 2 liters oxygen at night and with activity as needed during the day  Time Spent Involved in Patient Care on Day of Examination:  22 minutes  Follow up:  Patient Instructions  Follow up in 6 months   Medication List:  Allergies as of 03/30/2020      Reactions   Losartan Other (See Comments)   hyperkalemia   Penicillins Hives, Swelling, Rash   Has patient had a PCN reaction causing immediate rash, facial/tongue/throat swelling, SOB or lightheadedness with hypotension: YES Has patient had a PCN reaction causing severe rash involving mucus membranes or skin necrosis: NO Has patient had a PCN reaction that required hospitalization NO Has patient had a PCN  reaction occurring within the last 10 years: NO If all of the above answers are "NO", then may proceed with Cephalosporin use.   Cefixime [kdc:cefixime] Other (See Comments)   unspecified   Gabapentin Other (See Comments)   Feels woozy   Indocin [indomethacin] Other (See Comments)   Painful tongue and throat   Singulair [montelukast Sodium]    Chest tightness, decreased mental clarity   Sulfa Antibiotics Other (See Comments)   unspecified   Ace Inhibitors Other (See Comments)   cough   Statins Other (See Comments)   Myalgias      Medication List       Accurate as of March 30, 2020  5:14 PM. If you have any questions, ask your nurse or doctor.        albuterol 108 (90 Base) MCG/ACT inhaler Commonly known as: ProAir HFA Inhale 2 puffs into the lungs every 4 (four) hours as needed for wheezing or shortness of breath.   alendronate 70 MG tablet Commonly known as: FOSAMAX Take 1 tablet (70 mg total) by mouth once a week. Take with a full glass of water on an empty stomach.   amLODipine 5 MG tablet Commonly known as: NORVASC TAKE 1 TABLET(5 MG) BY MOUTH DAILY   aspirin EC 81 MG tablet Take 81 mg by mouth daily.   azelastine 0.1 % nasal spray Commonly known as: ASTELIN Place 2 sprays into both nostrils 2 (two) times daily. Use in each nostril as directed   clobetasol 0.05 % external solution Commonly known as: TEMOVATE APPLY TO THE AFFECTED AREA EVERY DAY   diazepam 5 MG tablet Commonly known as: VALIUM TAKE 1 TABLET BY MOUTH THREE TIMES DAILY AS NEEDED FOR ANXIETY AND INSOMNIA   fluorometholone 0.1 % ophthalmic suspension Commonly known as: FML Place 1 drop into both eyes as directed. Tapering off   furosemide 20 MG tablet Commonly known as: LASIX TAKE 1 TABLET BY MOUTH EVERY DAY AS NEEDED FOR INCREASED FLUID COLLECTION IN LEGS   mirabegron ER 25 MG Tb24 tablet Commonly known as: MYRBETRIQ Take 1 tablet (25 mg total) by mouth daily.   nitrofurantoin  (macrocrystal-monohydrate) 100 MG capsule Commonly known as: MACROBID Take 1 capsule (100 mg total) by mouth every 12 (twelve) hours.   nitrofurantoin 50 MG capsule Commonly known as: MACRODANTIN Take 1 capsule (50 mg total) by mouth at bedtime.   PROBIOTIC DAILY PO Take 1 capsule by mouth daily.   roflumilast 500 MCG Tabs tablet Commonly known as: DALIRESP Take 1 tablet (500 mcg total) by mouth daily.   Soothe XP Soln Place 1 drop into both eyes 2 (two) times daily as needed (irritation).   Synthroid 88 MCG tablet Generic drug: levothyroxine TAKE 1 TABLET(88 MCG) BY MOUTH DAILY   Trelegy Ellipta 100-62.5-25 MCG/INH Aepb Generic drug: Fluticasone-Umeclidin-Vilant Inhale 1 puff into the lungs daily.   triamcinolone 0.1 % Commonly known as: KENALOG Apply 1 application topically 2 (two) times daily.   venlafaxine 50 MG tablet Commonly known as: EFFEXOR TAKE 1 TABLET BY MOUTH TWICE DAILY  Visine 0.05 % ophthalmic solution Generic drug: tetrahydrozoline 1 drop  as needed   vitamin B-12 100 MCG tablet Commonly known as: CYANOCOBALAMIN Take 100 mcg by mouth daily.       Signature:  Chesley Mires, MD [place] Pager - [phone] 03/30/2020, 5:14 PM

## 2020-03-30 NOTE — Patient Instructions (Signed)
Follow up in 6 months 

## 2020-04-03 ENCOUNTER — Ambulatory Visit: Payer: Self-pay | Admitting: *Deleted

## 2020-04-05 ENCOUNTER — Telehealth: Payer: Self-pay

## 2020-04-05 NOTE — Telephone Encounter (Signed)
Signed and put in box to go up front. Signed:  Crissie Sickles, MD           04/05/2020

## 2020-04-05 NOTE — Telephone Encounter (Signed)
Patient dropped off form to be signed by Dr. Anitra Lauth. Please call patient when form is ready for pick up at [phone] or [phone].  [place] - Application for renewal of Disability Parking Placard

## 2020-04-05 NOTE — Telephone Encounter (Signed)
Form started, Placed on PCP desk to review and sign, if appropriate.

## 2020-04-06 NOTE — Telephone Encounter (Signed)
Called patient and made aware. Stated she will pick up today or next week.

## 2020-04-25 NOTE — Progress Notes (Unsigned)
Follow-up Visit   Date: 04/25/20   Priscilla Cantrell MRN: [id number] DOB: 10/25/1935   Interim History: Priscilla Cantrell is a 85 y.o. right-handed Caucasian female with GERD, hypertension, cervical canal stenosis returning to the clinic for follow-up of neuropathy.  The patient was accompanied to the clinic by self.  She has noticed significant improvement in feet discomfort after massaging her feet with lotion.  Her cramps are well-controlled, if she takes mustard.  Numbness remains in the feet, no change. She continues to have some imbalance, no falls.  She walks unassisted.   She reports some proximal arm pain, such as when raising her arms to reach for something, as well as occasionally dropping objects.  NCS/EMG of the arms previous did not show entrapment neuropathy.   MRI cervical spine shows multilevel cervical spondylosis with foraminal stenosis and canal worse at C4-5 and C5-6.    Current Outpatient Medications on File Prior to Visit  Medication Sig  . albuterol (PROAIR HFA) 108 (90 Base) MCG/ACT inhaler Inhale 2 puffs into the lungs every 4 (four) hours as needed for wheezing or shortness of breath.  Marland Kitchen alendronate (FOSAMAX) 70 MG tablet Take 1 tablet (70 mg total) by mouth once a week. Take with a full glass of water on an empty stomach.  Marland Kitchen amLODipine (NORVASC) 5 MG tablet TAKE 1 TABLET(5 MG) BY MOUTH DAILY  . Artificial Tear Solution (SOOTHE XP) SOLN Place 1 drop into both eyes 2 (two) times daily as needed (irritation).  Marland Kitchen aspirin EC 81 MG tablet Take 81 mg by mouth daily.  Marland Kitchen azelastine (ASTELIN) 0.1 % nasal spray Place 2 sprays into both nostrils 2 (two) times daily. Use in each nostril as directed  . clobetasol (TEMOVATE) 0.05 % external solution APPLY TO THE AFFECTED AREA EVERY DAY  . diazepam (VALIUM) 5 MG tablet TAKE 1 TABLET BY MOUTH THREE TIMES DAILY AS NEEDED FOR ANXIETY AND INSOMNIA  . fluorometholone (FML) 0.1 % ophthalmic suspension Place 1 drop into both eyes  as directed. Tapering off  . Fluticasone-Umeclidin-Vilant (TRELEGY ELLIPTA) 100-62.5-25 MCG/INH AEPB Inhale 1 puff into the lungs daily.  . furosemide (LASIX) 20 MG tablet TAKE 1 TABLET BY MOUTH EVERY DAY AS NEEDED FOR INCREASED FLUID COLLECTION IN LEGS  . mirabegron ER (MYRBETRIQ) 25 MG TB24 tablet Take 1 tablet (25 mg total) by mouth daily.  . nitrofurantoin (MACRODANTIN) 50 MG capsule Take 1 capsule (50 mg total) by mouth at bedtime.  . nitrofurantoin, macrocrystal-monohydrate, (MACROBID) 100 MG capsule Take 1 capsule (100 mg total) by mouth every 12 (twelve) hours.  . Probiotic Product (PROBIOTIC DAILY PO) Take 1 capsule by mouth daily.   . roflumilast (DALIRESP) 500 MCG TABS tablet Take 1 tablet (500 mcg total) by mouth daily.  Marland Kitchen SYNTHROID 88 MCG tablet TAKE 1 TABLET(88 MCG) BY MOUTH DAILY  . triamcinolone cream (KENALOG) 0.1 % Apply 1 application topically 2 (two) times daily.  Marland Kitchen venlafaxine (EFFEXOR) 50 MG tablet TAKE 1 TABLET BY MOUTH TWICE DAILY  . VISINE 0.05 % ophthalmic solution 1 drop  as needed  . vitamin B-12 (CYANOCOBALAMIN) 100 MCG tablet Take 100 mcg by mouth daily.  Marland Kitchen VITAMIN D PO Take by mouth.   No current facility-administered medications on file prior to visit.   Allergies  Allergen Reactions  . Losartan Other (See Comments)    hyperkalemia  . Penicillins Hives, Swelling and Rash    Has patient had a PCN reaction causing immediate rash, facial/tongue/throat swelling, SOB or  lightheadedness with hypotension: YES Has patient had a PCN reaction causing severe rash involving mucus membranes or skin necrosis: NO Has patient had a PCN reaction that required hospitalization NO Has patient had a PCN reaction occurring within the last 10 years: NO If all of the above answers are "NO", then may proceed with Cephalosporin use.   . Cefixime [Kdc:Cefixime] Other (See Comments)    unspecified  . Gabapentin Other (See Comments)    Feels woozy  . Indocin [Indomethacin] Other  (See Comments)    Painful tongue and throat  . Singulair [Montelukast Sodium]     Chest tightness, decreased mental clarity  . Sulfa Antibiotics Other (See Comments)    unspecified  . Ace Inhibitors Other (See Comments)    cough  . Statins Other (See Comments)    Myalgias    Vitals:   04/26/20 0850  BP: 134/81  Pulse: 82  Height: 5' (1.524 m)  Weight: 134 lb (60.8 kg)  SpO2: 94%  BMI (Calculated): 26.17     Neurological Exam: MENTAL STATUS including orientation to time, place, person, recent and remote memory, attention span and concentration, language, and fund of knowledge is normal.  Speech is not dysarthric.  CRANIAL NERVES:  Pupils equal round and reactive to light.  Normal conjugate, extra-ocular eye movements in all directions of gaze.  No ptosis. Face is symmetric.   MOTOR:  Motor strength is 5/5 in all extremities, including distally.  There is mild loss of muscle bulk in the hands.  No fasciculations or abnormal movements.  No pronator drift.  Tone is normal.    MSRs:  Reflexes are 3+/4 throughout, except absent in the ankles.  SENSORY:  Vibration is reduced at the ankles bilaterally.  COORDINATION/GAIT:  Normal finger-to- nose-finger.  Intact rapid alternating movements bilaterally.  Gait narrow based and stable, stooped posture.    Data: NCS/EMG bilateral upper extremities 04/05/2019:   This is a normal study of the upper extremities.  In particular, there is no evidence of carpal tunnel syndrome, ulnar neuropathy, or cervical radiculopathy affecting the upper extremities.  Incidentally, there is evidence of bilateral [name]-Gruber anastomoses, a normal anatomic variant.  MRI cervical spine 12/03/2018: 1. Moderate bilateral foraminal stenosis at C5-6 which could affect either or both C6 nerves. 2. Slight compression of the ventral aspect of the spinal cord at C4-5 and C5-6 without myelopathy.  IMPRESSION/PLAN: 1. Multilevel cervical canal spondylosis with  foraminal stenosis and canal stenosis worse at C4-5 and C5-6.  Today, she reports proximal arm weakness and dropping objects.  No evidence of entrapment neuropathy on her electrodiagnostic testing, so its very likely that she is symptomatic at this level.  She completed physical therapy in 2021 which helped her arm paresthesias, strength, and stability.  I will refer her back to PT and also seek the opinion of neurosurgery.   2.  Idiopathic peripheral neuropathy manifesting with numbness and sensory ataxia, stable.  Patient educated on daily foot inspection, fall prevention, and safety precautions around the home.  3.  Restless leg syndrome, stable. She manages symptoms with mustard.   Return to clinic in 6 months  Thank you for allowing me to participate in patient's care.  If I can answer any additional questions, I would be pleased to do so.    Sincerely,    Donika K. Posey Pronto, DO

## 2020-04-26 ENCOUNTER — Encounter: Payer: Self-pay | Admitting: Neurology

## 2020-04-26 ENCOUNTER — Ambulatory Visit (INDEPENDENT_AMBULATORY_CARE_PROVIDER_SITE_OTHER): Payer: Medicare Other | Admitting: Neurology

## 2020-04-26 ENCOUNTER — Other Ambulatory Visit: Payer: Self-pay

## 2020-04-26 VITALS — BP 134/81 | HR 82 | Ht 60.0 in | Wt 134.0 lb

## 2020-04-26 DIAGNOSIS — M4712 Other spondylosis with myelopathy, cervical region: Secondary | ICD-10-CM | POA: Diagnosis not present

## 2020-04-26 NOTE — Patient Instructions (Addendum)
We will refer you to see [facility] for their opinion   Return to clinic in 6 months

## 2020-04-27 ENCOUNTER — Ambulatory Visit: Payer: Medicare Other | Admitting: Neurology

## 2020-05-09 ENCOUNTER — Ambulatory Visit (INDEPENDENT_AMBULATORY_CARE_PROVIDER_SITE_OTHER): Payer: Medicare Other | Admitting: Family Medicine

## 2020-05-09 ENCOUNTER — Other Ambulatory Visit: Payer: Self-pay

## 2020-05-09 ENCOUNTER — Encounter: Payer: Self-pay | Admitting: Family Medicine

## 2020-05-09 VITALS — BP 134/80 | HR 59 | Temp 98.0°F | Resp 16 | Ht 60.0 in | Wt 132.4 lb

## 2020-05-09 DIAGNOSIS — F411 Generalized anxiety disorder: Secondary | ICD-10-CM | POA: Diagnosis not present

## 2020-05-09 DIAGNOSIS — E039 Hypothyroidism, unspecified: Secondary | ICD-10-CM

## 2020-05-09 DIAGNOSIS — I10 Essential (primary) hypertension: Secondary | ICD-10-CM

## 2020-05-09 DIAGNOSIS — I872 Venous insufficiency (chronic) (peripheral): Secondary | ICD-10-CM

## 2020-05-09 LAB — CBC WITH DIFFERENTIAL/PLATELET
Basophils Absolute: 0 10*3/uL (ref 0.0–0.1)
Basophils Relative: 0.7 % (ref 0.0–3.0)
Eosinophils Absolute: 0.2 10*3/uL (ref 0.0–0.7)
Eosinophils Relative: 2.8 % (ref 0.0–5.0)
HCT: 44 % (ref 36.0–46.0)
Hemoglobin: 14.5 g/dL (ref 12.0–15.0)
Lymphocytes Relative: 25.7 % (ref 12.0–46.0)
Lymphs Abs: 1.6 10*3/uL (ref 0.7–4.0)
MCHC: 32.9 g/dL (ref 30.0–36.0)
MCV: 89.8 fl (ref 78.0–100.0)
Monocytes Absolute: 0.6 10*3/uL (ref 0.1–1.0)
Monocytes Relative: 9.9 % (ref 3.0–12.0)
Neutro Abs: 3.8 10*3/uL (ref 1.4–7.7)
Neutrophils Relative %: 60.9 % (ref 43.0–77.0)
Platelets: 255 10*3/uL (ref 150.0–400.0)
RBC: 4.9 Mil/uL (ref 3.87–5.11)
RDW: 14.2 % (ref 11.5–15.5)
WBC: 6.2 10*3/uL (ref 4.0–10.5)

## 2020-05-09 LAB — COMPREHENSIVE METABOLIC PANEL
ALT: 13 U/L (ref 0–35)
AST: 16 U/L (ref 0–37)
Albumin: 4.5 g/dL (ref 3.5–5.2)
Alkaline Phosphatase: 105 U/L (ref 39–117)
BUN: 20 mg/dL (ref 6–23)
CO2: 29 mEq/L (ref 19–32)
Calcium: 9.5 mg/dL (ref 8.4–10.5)
Chloride: 105 mEq/L (ref 96–112)
Creatinine, Ser: 0.91 mg/dL (ref 0.40–1.20)
GFR: 58.54 mL/min — ABNORMAL LOW (ref >60.00)
Glucose, Bld: 82 mg/dL (ref 70–99)
Potassium: 3.9 mEq/L (ref 3.5–5.1)
Sodium: 141 mEq/L (ref 135–145)
Total Bilirubin: 0.5 mg/dL (ref 0.2–1.2)
Total Protein: 6.9 g/dL (ref 6.0–8.3)

## 2020-05-09 LAB — TSH: TSH: 7.71 u[IU]/mL — ABNORMAL HIGH (ref 0.35–4.50)

## 2020-05-09 NOTE — Progress Notes (Signed)
OFFICE VISIT  05/09/2020  CC:  Chief Complaint  Patient presents with  . Follow-up    RCI, not fasting   HPI:    Patient is a 85 y.o. Caucasian female who presents for 4 mo f/u lower extremity edema, anxiety, HTN, and hypothyroidism.  She saw neurol for f/u 04/26/20 and was referred back for more PT and referred for neurosurg opinion for her multilevel cervical canal spondylosis with foraminal stenosis and canal stenosis worse at C4-5 and C5-6.   Pulm f/u 03/30/20: continued on daliresp and trelegy and nocturnal oxygen. GI ([facility]) 03/06/20: fecal smearing likely d/t incomplete evac, was put on fiber supp, If no relief than consider anorectal manometry.  HTN: home bp consistently <130/80.  Hypoth: Takes T4 on full stomach w/out any other meds--this is the way she has always done it.  LE edema: taking lasix 20mg  about qod and ankles are minimally swollen.  Some nocturia.  Anxiety and anxiety-related insomnia has been well controlled on venlafaxine 50mg  bid and diazepam 5mg  bid prn. PMP AWARE reviewed today: most recent rx for diaz 5mg  was filled 02/15/20, # 33, rx by me. No red flags.  ROS as above, plus--> no fevers, no CP, no SOB, no wheezing, no cough, no dizziness, no HAs, no rashes, no melena/hematochezia.  No polyuria or polydipsia.  No myalgias or arthralgias.  No focal weakness, paresthesias, or tremors.  No acute vision or hearing abnormalities.  No dysuria or unusual/new urinary urgency or frequency.  No recent changes in lower legs. No n/v/d or abd pain.  No palpitations.     Past Medical History:  Diagnosis Date  . Allergic rhinitis    with upper airway cough: as of 07/2017 pulm f/u she was instructed to use astelin, flonase, and saline more consistently  . BPPV (benign paroxysmal positional vertigo) 03/2018  . Chest pain, non-cardiac    Cardiac CT showed no signif obstructive dz, mildly elevated calcium level (Dr. [name]).  . Chronic cystitis with hematuria    Dr.  [name]  . Chronic hypoxemic respiratory failure ([place]) 02/02/2015   2L oxygen 24/7  . Cold hands    NCS/EMGs normal.  Hand dysesthesias per neuro--reassured  . COPD (chronic obstructive pulmonary disease) (HCC)    GOLD 4.  spirometry 01/10/09 FEV 0.97(52%), FEV1% 47.  With chronic bronchitis as of 07/2017.  . DDD (degenerative disc disease), cervical    1998 MRI C5-6 impingemt (left).  MRI 2021 (neurol) 1 level canal stenosis, 1 level foraminal stenosis--->PT  . DDD (degenerative disc disease), lumbar   . Depression with anxiety 04/15/2011  . Diarrheal disease Summer 2017   ? 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).  As of 10/05/15 pt reports GI dx'd her with C diff and rx'd more flagyl and she is also on cholestyramine.  As of 02/2016, pt's sx's resolved completely.  . Diverticulosis of colon    Procto '97 and colonoscopy 2002  . Dysplastic nevus of upper extremity 04/2014   R tricep ([name])  . Edema of both lower extremities due to peripheral venous insufficiency    Varicosities bilat, swelling L>R.  R baker's cyst.  Got vein clinic eval 02/2018->sclerotherapy recommended but since no hemorrhage or ulceration, insurance will not cover the procedure.  . Family history of adverse reaction to anesthesia    mother died when patient age 59 receiving anesthesia for thyroid surgery  . Fibrocystic breast disease  w/fibroadenoma.  Bx's showed NO atypia (Dr. Margot Chimes).  Mammo neg 2008.  Marland Kitchen GERD (gastroesophageal reflux disease)   . History of double vision    Ophthalmologist, Dr. Herbert Deaner, is further evaluating this with MRI  orbits and limited brain (myesthenia gravis testing neg 07/2010)  . History of home oxygen therapy    uses 2 liters at hs  . Hypertension    EKG 03/2010 normal  . Hypothyroidism    Hashimoto's, dx'd 1989  . Idiopathic peripheral neuropathy 04/2018   Sensory (numb  feet) S1 distribution bilat, c/w spinal stenosis sensory neuropathy (no pain). 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  Labs [place], MRI C spine->some spondylosis/stenosis.  UE NCS/EMS: normal 04/2019.  Marland Kitchen Lesion of right native kidney 2017   found on CT 02/2015.  F/u MRI 03/31/15 showed it to be smaller and less likely of any significance but repeat CT renal protocol in 31mo was recommended by radiology.  This was done 10/26/15 and showed no interval change in the lesion, but b/c the lesion enhances with contrast, radiol rec'd urol consult (b/c pap renal RCC could not be excluded).  Saw urol 03/06/16--stable on u/s 09/2016 and 09/2017.  . Osteoarthritis, hip, bilateral    she is s/p bilat THA as of 02/2018.  . Osteoporosis    radius, T score -2.5->rec'd alendronate 08/2019->plan rpt DEXA 08/2021.  Marland Kitchen Pneumonia   . Postmenopausal atrophic vaginitis    improved with estrace 2 X/week.  . Prolapse of vaginal cuff after hysterectomy    10/2018: Dr. Wayne Both. Mild colpocele 04/2019 per GYN (no surgery)  . Pulmonary nodule    CT chest 12/10, June 2011.  No nodule on CT 06/2010.  Marland Kitchen Recurrent Clostridium difficile diarrhea    Episode 09/19/2016: resolved with prolonged course of flagyl.  11/2016 recurrence treated with 10d of Dificid (Fidaxomicin) by GI (Dr. Earlean Shawl).  . Recurrent UTI    likely related to pt's atrophic vaginitis.  Urol started vaginal estrace 03/2016.  . Rib fracture 09/2018   R 7th, laterally-->sustained when her dog pulled her over and she fell.  Marland Kitchen RLS (restless legs syndrome)    neuro->gabapentin trial 2020/21  . Shingles 02/2014   R abd/flank  . Spinal stenosis of lumbar region with neurogenic claudication    2008 MRI (L3-4, L4-5, L5-S1).  +S1 distribution sensory loss bilat    Past Surgical History:  Procedure Laterality Date  . ABDOMINAL HYSTERECTOMY  1978   no BSO per pt--nonmalignant reasons  . APPENDECTOMY    . BREAST CYST  EXCISION     Last screening mammogram 10/2010 was normal.  . CHOLECYSTECTOMY  2001  . COLONOSCOPY  04/2005; 12/06/2015   2007 NORMAL.  2017 adenomatous polyp x 1.  Mod sigmoid diverticulosis (Dr. Earlean Shawl).    Marland Kitchen DEXA  05/18/2017; 08/7019   2019 NORMAL (T-score 0.1).  08/2019->T score -2.5->alendronate started->plan Rpt 08/2021.  Marland Kitchen HIP CLOSED REDUCTION Right 12/25/2015   Procedure: CLOSED REDUCTION HIP;  Surgeon: Nicholes Stairs, MD;  Location: [facility];  Service: Orthopedics;  Laterality: Right;  . NCV/EMS  10/2018   chronic and symmetric sensorimotor axonal polyneuropathy affecting the lower extremities  . TONSILLECTOMY    . TOTAL HIP ARTHROPLASTY Right 11/01/2014   Procedure: RIGHT TOTAL HIP ARTHROPLASTY;  Surgeon: Latanya Maudlin, MD;  Location: [facility];  Service: Orthopedics;  Laterality: Right;  . TOTAL HIP ARTHROPLASTY Left 12/08/2017   Procedure: LEFT TOTAL HIP ARTHROPLASTY ANTERIOR APPROACH;  Surgeon: Paralee Cancel, MD;  Location:  [facility];  Service: Orthopedics;  Laterality: Left;  70 mins  . TRANSTHORACIC ECHOCARDIOGRAM  08/2014   EF 60-65%, grade I diast dysfxn  . TUBAL LIGATION  1974    Outpatient Medications Prior to Visit  Medication Sig Dispense Refill  . alendronate (FOSAMAX) 70 MG tablet Take 1 tablet (70 mg total) by mouth once a week. Take with a full glass of water on an empty stomach. 12 tablet 3  . amLODipine (NORVASC) 5 MG tablet TAKE 1 TABLET(5 MG) BY MOUTH DAILY 90 tablet 0  . Artificial Tear Solution (SOOTHE XP) SOLN Place 1 drop into both eyes 2 (two) times daily as needed (irritation).    Marland Kitchen aspirin EC 81 MG tablet Take 81 mg by mouth daily.    Marland Kitchen azelastine (ASTELIN) 0.1 % nasal spray Place 2 sprays into both nostrils 2 (two) times daily. Use in each nostril as directed 30 mL 3  . clobetasol (TEMOVATE) 0.05 % external solution APPLY TO THE AFFECTED AREA EVERY DAY 50 mL 4  . diazepam (VALIUM) 5 MG tablet TAKE 1 TABLET BY MOUTH THREE TIMES DAILY AS NEEDED FOR ANXIETY AND  INSOMNIA 90 tablet 5  . fluorometholone (FML) 0.1 % ophthalmic suspension Place 1 drop into both eyes as directed. Tapering off    . Fluticasone-Umeclidin-Vilant (TRELEGY ELLIPTA) 100-62.5-25 MCG/INH AEPB Inhale 1 puff into the lungs daily. 60 each 12  . furosemide (LASIX) 20 MG tablet TAKE 1 TABLET BY MOUTH EVERY DAY AS NEEDED FOR INCREASED FLUID COLLECTION IN LEGS 30 tablet 3  . mirabegron ER (MYRBETRIQ) 25 MG TB24 tablet Take 1 tablet (25 mg total) by mouth daily. 30 tablet 3  . nitrofurantoin (MACRODANTIN) 50 MG capsule Take 1 capsule (50 mg total) by mouth at bedtime. 90 capsule 3  . nitrofurantoin, macrocrystal-monohydrate, (MACROBID) 100 MG capsule Take 1 capsule (100 mg total) by mouth every 12 (twelve) hours. 14 capsule 0  . Probiotic Product (PROBIOTIC DAILY PO) Take 1 capsule by mouth daily.     . roflumilast (DALIRESP) 500 MCG TABS tablet Take 1 tablet (500 mcg total) by mouth daily. 30 tablet 11  . SYNTHROID 88 MCG tablet TAKE 1 TABLET(88 MCG) BY MOUTH DAILY 30 tablet 5  . triamcinolone cream (KENALOG) 0.1 % Apply 1 application topically 2 (two) times daily. 30 g 0  . venlafaxine (EFFEXOR) 50 MG tablet TAKE 1 TABLET BY MOUTH TWICE DAILY 180 tablet 1  . VISINE 0.05 % ophthalmic solution 1 drop  as needed    . vitamin B-12 (CYANOCOBALAMIN) 100 MCG tablet Take 100 mcg by mouth daily.    Marland Kitchen VITAMIN D PO Take by mouth.    Marland Kitchen albuterol (PROAIR HFA) 108 (90 Base) MCG/ACT inhaler Inhale 2 puffs into the lungs every 4 (four) hours as needed for wheezing or shortness of breath. (Patient not taking: Reported on 05/09/2020) 8.5 g 1   No facility-administered medications prior to visit.    Allergies  Allergen Reactions  . Losartan Other (See Comments)    hyperkalemia  . Penicillins Hives, Swelling and Rash    Has patient had a PCN reaction causing immediate rash, facial/tongue/throat swelling, SOB or lightheadedness with hypotension: YES Has patient had a PCN reaction causing severe rash  involving mucus membranes or skin necrosis: NO Has patient had a PCN reaction that required hospitalization NO Has patient had a PCN reaction occurring within the last 10 years: NO If all of the above answers are "NO", then may proceed with Cephalosporin use.   Marland Kitchen  Cefixime [Kdc:Cefixime] Other (See Comments)    unspecified  . Gabapentin Other (See Comments)    Feels woozy  . Indocin [Indomethacin] Other (See Comments)    Painful tongue and throat  . Singulair [Montelukast Sodium]     Chest tightness, decreased mental clarity  . Sulfa Antibiotics Other (See Comments)    unspecified  . Ace Inhibitors Other (See Comments)    cough  . Statins Other (See Comments)    Myalgias     ROS As per HPI  PE: Vitals with BMI 05/09/2020 04/26/2020 03/30/2020  Height 5\' 0"  5\' 0"  5\' 0"   Weight 132 lbs 6 oz 134 lbs 132 lbs  BMI 25.86 73.71 06.26  Systolic 948 546 270  Diastolic 80 81 78  Pulse 59 82 84  Some encounter information is confidential and restricted. Go to Review Flowsheets activity to see all data.  O2 sat on RA today is 96%  Gen: Alert, well appearing.  Patient is oriented to person, place, time, and situation. AFFECT: pleasant, lucid thought and speech. CV: RRR, no m/r/g.   LUNGS: CTA bilat, nonlabored resps, good aeration in all lung fields. EXT: no clubbing or cyanosis.  1+ bilat pitting ankle edema.  Diffuse freckling type hyperpigmentation changes to LL's/ankles/feet skin. Radial pulses 2+ bilat. PT and DP pulses 2+ bilat.   LABS:  Lab Results  Component Value Date   TSH 2.09 05/11/2019   Lab Results  Component Value Date   WBC 8.9 05/26/2018   HGB 13.7 05/26/2018   HCT 42.0 05/26/2018   MCV 86.9 05/26/2018   PLT 269.0 05/26/2018   Lab Results  Component Value Date   CREATININE 0.99 01/12/2020   BUN 19 01/12/2020   NA 142 01/12/2020   K 4.3 01/12/2020   CL 104 01/12/2020   CO2 31 01/12/2020   Lab Results  Component Value Date   ALT 9 12/08/2019   AST 13  12/08/2019   ALKPHOS 87 12/08/2019   BILITOT 0.5 12/08/2019   Lab Results  Component Value Date   CHOL 211 (H) 05/26/2018   Lab Results  Component Value Date   HDL 68.20 05/26/2018   Lab Results  Component Value Date   LDLCALC 127 (H) 05/26/2018   Lab Results  Component Value Date   TRIG 79.0 05/26/2018   Lab Results  Component Value Date   CHOLHDL 3 05/26/2018   Lab Results  Component Value Date   HGBA1C 5.7 10/29/2011    IMPRESSION AND PLAN:  1) HTN: stable. Cont amlodipine 5mg  qd. Lytes/cr today.  2) LE venous insufficiency edema: minimally bothersome at this point. Cut lasix back to 1/2 of 20mg  dose qod prn.  3) Hypothyroidism, acquired: takes T4 with food but has done so habitually for years. TSH today.  4) GAD and anxiety-related insomnia: stable. Cont venlafaxine 50mg  bid and diazepam 5mg  bid prn.  5) COPD: stable on daliresp and trelegy, nocturnal oxygen supplement. Keep routine pulm f/u.  6) Fecal smearing: suspect incomplete evacuation periodically/pelvic floor incompetence. Per GI, is doing better with daily fiber supplement.  7) Preventative health care: Mammogram due 08/2020. Tdap due->will address next visit.  An After Visit Summary was printed and given to the patient.  FOLLOW UP: Return in about 4 months (around 09/08/2020) for routine chronic illness f/u.  Signed:  Crissie Sickles, MD           05/09/2020

## 2020-05-10 ENCOUNTER — Telehealth: Payer: Self-pay | Admitting: Family Medicine

## 2020-05-10 ENCOUNTER — Other Ambulatory Visit: Payer: Self-pay

## 2020-05-10 DIAGNOSIS — E039 Hypothyroidism, unspecified: Secondary | ICD-10-CM

## 2020-05-10 NOTE — Telephone Encounter (Signed)
Last o/v was 05/10/20 and last rx was 02/15/20 #90 RF x5. [place] for change?

## 2020-05-10 NOTE — Telephone Encounter (Signed)
Patient states she is only taking one valium per day, half in the morning and half at night. She would like her RX changed to qty 30 because she does not want that many extra. Per patient, the pharmacy needs [place] to send in a new RX with updated sig. Please send to same [facility] in [place].

## 2020-05-11 ENCOUNTER — Other Ambulatory Visit: Payer: Self-pay | Admitting: Family Medicine

## 2020-05-11 MED ORDER — DIAZEPAM 5 MG PO TABS: ORAL_TABLET | ORAL | 5 refills | Status: DC

## 2020-05-11 NOTE — Telephone Encounter (Signed)
Yes, okay for change-thx

## 2020-05-11 NOTE — Telephone Encounter (Signed)
New rx sent to [facility] in [place] with updated sig, patient was notified.

## 2020-05-22 ENCOUNTER — Telehealth: Payer: Self-pay

## 2020-05-22 MED ORDER — CETIRIZINE HCL 10 MG PO TABS: 10.0000 mg | ORAL_TABLET | Freq: Every day | ORAL | 6 refills | Status: DC

## 2020-05-22 MED ORDER — FLUTICASONE PROPIONATE 50 MCG/ACT NA SUSP: 2.0000 | Freq: Every day | NASAL | 6 refills | Status: DC

## 2020-05-22 MED ORDER — ALBUTEROL SULFATE HFA 108 (90 BASE) MCG/ACT IN AERS: 2.0000 | INHALATION_SPRAY | RESPIRATORY_TRACT | 1 refills | Status: DC | PRN

## 2020-05-22 MED ORDER — AZELASTINE HCL 0.1 % NA SOLN
2.0000 | Freq: Two times a day (BID) | NASAL | 3 refills | Status: DC
Start: 2020-05-22 — End: 2022-03-03

## 2020-05-22 NOTE — Telephone Encounter (Signed)
Please advise of other treatment or if appt is needed    [place] RECORD AccessNurse Patient Name: Priscilla Cantrell STEP HENS Gender: Female DOB: Jan 19, 1936 Age: 85 Y 71 D Return Phone Number: [phone] (Primary), [phone] (Secondary) Address: [place]  [address] Client  [facility] - Client Client Site [place] - Day Physician Crissie Sickles - MD Contact Type Call Who Is Calling Patient / Member / Family / Caregiver Call Type Triage / Clinical Relationship To Patient Self Return Phone Number [phone] (Primary) Chief Complaint Nasal Allergies (Hay Fever) Reason for Call Symptomatic / Request for Health Information Initial Comment Caller has sinus symptoms and thinks it a sinus infection. Caller takes trulogy and would like to know what she can take with it? Translation No Nurse Assessment Nurse: Hardin Negus, RN, Danae Chen Date/Time [name] Time): 05/18/2020 3:19:38 PM Confirm and document reason for call. If symptomatic, describe symptoms. ---Caller states she has sinus symptoms- nasal congestion with green nasal drainage- started this morning after working out in her yard yesterday. Hx of bronchitis after sinus symptoms. Has COPD- is on trelegy. Occasional dry cough. No fever. Has had chills. Took a nap today- more tired than usual. Does the patient have any new or worsening symptoms? ---Yes Will a triage be completed? ---Yes Related visit to physician within the last 2 weeks? ---No Does the PT have any chronic conditions? (i.e. diabetes, asthma, this includes High risk factors for pregnancy, etc.) ---Yes List chronic conditions. ---COPD thyroid HTN- amlodipine anxiety- valium deilrisp Is this a behavioral health or substance abuse call? ---No Guidelines Guideline Title Affirmed Question Affirmed Notes Nurse Date/Time (Eastern Time) Sinus Pain  or Congestion [1] Using nasal washes and pain medicine > 24 hours AND [2] sinus pain (around cheekbone or eye) persists Hardin Negus, RN, Danae Chen 05/18/2020 3:27:22 PM PLEASE NOTE: All timestamps contained within this report are represented as [place] Standard Time. CONFIDENTIALTY NOTICE: This fax transmission is intended only for the addressee. It contains information that is legally privileged, confidential or otherwise protected from use or disclosure. If you are not the intended recipient, you are strictly prohibited from reviewing, disclosing, copying using or disseminating any of this information or taking any action in reliance on or regarding this information. If you have received this fax in error, please notify us immediately by telephone so that we can arrange for its return to [place]. Phone: [phone], Toll-Free: [phone], Fax: [phone] Page: 2 of 2 Call Id: [id number] [place]. Time [name] Time) Disposition Final User 05/18/2020 3:00:29 PM Attempt made - line busy Hardin Negus, RN, Danae Chen 05/18/2020 3:37:31 PM SEE PCP WITHIN 3 DAYS Yes Hardin Negus, RN, Danae Chen Caller Disagree/Comply Comply Caller Understands Yes PreDisposition InappropriateToAsk Care Advice Given Per Guideline SEE PCP WITHIN 3 DAYS: * You need to be seen within 2 or 3 days. PAIN MEDICINES: * ACETAMINOPHEN - REGULAR STRENGTH TYLENOL: Take 650 mg (two 325 mg pills) by mouth every 4 to 6 hours as needed. Each Regular Strength Tylenol pill has 325 mg of acetaminophen. The most you should take each day is 3,250 mg (10 pills a day). PAIN MEDICINES - EXTRA NOTES AND WARNINGS: * Before taking any medicine, read all the instructions on the package. NASAL WASHES FOR A STUFFY NOSE: * Introduction: Saline (salt water) nasal irrigation (nasal wash) is an effective and simple home remedy for treating stuffy nose and sinus congestion. The nose can be irrigated by pouring, spraying, or squirting  salt water into the nose and then  letting it run back out. * How it Helps: The salt water rinses out excess mucus and washes out any irritants (dust, allergens) that might be present. It also moistens the nasal cavity. * STEP 4: Blow your nose to clean out the water and mucus. * STEP 3: Some of the water may run into the back of your throat. Spit this out. If you swallow the salt water it will not hurt you. * STEP 2: Gently squirt or spray warm salt water into one of your nostrils. * STEP 1: Lean over a sink. NASAL WASHES - STEP-BY-STEP INSTRUCTIONS: * STEP 5: Repeat steps 1 through 4 for the other nostril. You can do this a couple times a day if it seems to help you. LOCAL COLD: * For sinus pain unresponsive to pain medicine and nasal washes, apply a cold pack or ice in a wet washcloth for 20 minutes four times a day as needed. CALL BACK IF: * Difficulty breathing (and not relieved by cleaning out nose) * You become worse CARE ADVICE given per Sinus Pain or Congestion (Adult) guideline. Comments User: Ferdinand Cava, RN Date/Time [name] Time): 05/18/2020 3:28:17 PM *is on oxygen at night Referrals REFERRED TO PCP OFFICE

## 2020-05-22 NOTE — Telephone Encounter (Signed)
Spoke with patient regarding results/recommendations.  

## 2020-05-22 NOTE — Addendum Note (Signed)
Addended by: Octaviano Glow on: 05/22/2020 03:44 PM   Modules accepted: Orders

## 2020-05-22 NOTE — Telephone Encounter (Signed)
Sounds like we need to maximize her treatment for seasonal allergies. I reveiwed her med list and it looks like she takes astelin nasal spray. Continue this PLUS otc saline nasal spray 2-3 times a day to clear nasal passages. Also, pls eRx flonase, 2 sprays each nostril once every day, #1, RF x 6. Also, rx generic zyrtec 10mg , 1 tab po qd, #30, RF x 6. Continue Trelegy inhaler, and she should have an albuterol "rescue" inhaler for when she feels chest tightness, dry cough, or wheezing. If she doesn't have the albuterol pls do rx for albuterol generic inhaler, 1-2 p q4h prn, #1, RF x 3---thx

## 2020-05-24 DIAGNOSIS — H02054 Trichiasis without entropian left upper eyelid: Secondary | ICD-10-CM | POA: Diagnosis not present

## 2020-05-24 DIAGNOSIS — H524 Presbyopia: Secondary | ICD-10-CM | POA: Diagnosis not present

## 2020-05-24 DIAGNOSIS — H04123 Dry eye syndrome of bilateral lacrimal glands: Secondary | ICD-10-CM | POA: Diagnosis not present

## 2020-05-24 DIAGNOSIS — H532 Diplopia: Secondary | ICD-10-CM | POA: Diagnosis not present

## 2020-05-25 ENCOUNTER — Other Ambulatory Visit: Payer: Self-pay

## 2020-05-25 ENCOUNTER — Ambulatory Visit (INDEPENDENT_AMBULATORY_CARE_PROVIDER_SITE_OTHER): Payer: Medicare Other | Admitting: Urology

## 2020-05-25 ENCOUNTER — Encounter: Payer: Self-pay | Admitting: Urology

## 2020-05-25 VITALS — BP 108/71 | HR 81 | Temp 98.0°F | Ht 60.0 in | Wt 126.0 lb

## 2020-05-25 DIAGNOSIS — N3021 Other chronic cystitis with hematuria: Secondary | ICD-10-CM

## 2020-05-25 LAB — MICROSCOPIC EXAMINATION

## 2020-05-25 LAB — URINALYSIS, ROUTINE W REFLEX MICROSCOPIC
Bilirubin, UA: NEGATIVE
Glucose, UA: NEGATIVE
Nitrite, UA: NEGATIVE
Specific Gravity, UA: 1.025 (ref 1.005–1.030)
Urobilinogen, Ur: 0.2 mg/dL (ref 0.2–1.0)
pH, UA: 6 (ref 5.0–7.5)

## 2020-05-25 MED ORDER — DOXYCYCLINE HYCLATE 100 MG PO CAPS: 100.0000 mg | ORAL_CAPSULE | Freq: Two times a day (BID) | ORAL | 0 refills | Status: DC

## 2020-05-25 MED ORDER — NITROFURANTOIN MACROCRYSTAL 50 MG PO CAPS: 50.0000 mg | ORAL_CAPSULE | Freq: Every day | ORAL | 3 refills | Status: DC

## 2020-05-25 NOTE — Progress Notes (Signed)
Urological Symptom Review  Patient is experiencing the following symptoms: Get up at night to urinate   Review of Systems  Gastrointestinal (upper)  : Negative for upper GI symptoms  Gastrointestinal (lower) : Diarrhea  Constitutional : Negative for symptoms  Skin: Negative for skin symptoms  Eyes: Negative for eye symptoms  Ear/Nose/Throat : Sinus problems  Hematologic/Lymphatic: Negative for Hematologic/Lymphatic symptoms  Cardiovascular : Leg swelling  Respiratory : Shortness of breath  Endocrine: Excessive thirst  Musculoskeletal: Back pain Joint pain  Neurological: Negative for neurological symptoms  Psychologic: Depression Anxiety

## 2020-05-25 NOTE — Progress Notes (Signed)
05/25/2020 12:10 PM   Olegario Shearer Jerrel Ivory Jun 25, 1935 [id number]  Referring provider: [name], MD [address] [place],  [place] [address]  Recurrent UTI  HPI: Ms Priscilla Cantrell is a 85yo here for followup for recurrent UTI. No UTIs since last visit. She is on macrobid 50mg  qhs for prophylaxis. She denies any significant LUTS. Nocturia 1x. No dysuria or hematuria. She recently pulled a tick off her back and noted swelling at the bite site. No other complaints today   PMH: Past Medical History:  Diagnosis Date  . Allergic rhinitis    with upper airway cough: as of 07/2017 pulm f/u she was instructed to use astelin, flonase, and saline more consistently  . BPPV (benign paroxysmal positional vertigo) 03/2018  . Chest pain, non-cardiac    Cardiac CT showed no signif obstructive dz, mildly elevated calcium level (Dr. [name]).  . Chronic cystitis with hematuria    [name]  . Chronic hypoxemic respiratory failure ([place]) 02/02/2015   2L oxygen 24/7  . Cold hands    NCS/EMGs normal.  Hand dysesthesias per neuro--reassured  . COPD (chronic obstructive pulmonary disease) ([facility])    GOLD 4.  spirometry 01/10/09 FEV 0.97(52%), FEV1% 47.  With chronic bronchitis as of 07/2017.  . DDD (degenerative disc disease), cervical    1998 MRI C5-6 impingemt (left).  MRI 2021 (neurol) 1 level canal stenosis, 1 level foraminal stenosis--->PT  . DDD (degenerative disc disease), lumbar   . Depression with anxiety 04/15/2011  . Diarrheal disease Summer 2017   ? 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).  As of 10/05/15 pt reports GI dx'd her with C diff and rx'd more flagyl and she is also on cholestyramine.  As of 02/2016, pt's sx's resolved completely.  . Diverticulosis of colon    Procto '97 and colonoscopy 2002  . Dysplastic nevus of upper extremity 04/2014   R tricep ([name])  .  Edema of both lower extremities due to peripheral venous insufficiency    Varicosities bilat, swelling L>R.  R baker's cyst.  Got vein clinic eval 02/2018->sclerotherapy recommended but since no hemorrhage or ulceration, insurance will not cover the procedure.  . Family history of adverse reaction to anesthesia    mother died when patient age 57 receiving anesthesia for thyroid surgery  . Fibrocystic breast disease    w/fibroadenoma.  Bx's showed NO atypia (Dr. Margot Chimes).  Mammo neg 2008.  Marland Kitchen GERD (gastroesophageal reflux disease)   . History of double vision    Ophthalmologist, Dr. Herbert Deaner, is further evaluating this with MRI  orbits and limited brain (myesthenia gravis testing neg 07/2010)  . History of home oxygen therapy    uses 2 liters at hs  . Hypertension    EKG 03/2010 normal  . Hypothyroidism    Hashimoto's, dx'd 1989  . Idiopathic peripheral neuropathy 04/2018   Sensory (numb feet) S1 distribution bilat, c/w spinal stenosis sensory neuropathy (no pain). 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  Labs [place], MRI C spine->some spondylosis/stenosis.  UE NCS/EMS: normal 04/2019.  Marland Kitchen Lesion of right native kidney 2017   found on CT 02/2015.  F/u MRI 03/31/15 showed it to be smaller and less likely of any significance but repeat CT renal protocol in 15mo was recommended by radiology.  This was done 10/26/15 and showed no interval change in the lesion, but b/c the lesion enhances  with contrast, radiol rec'd urol consult (b/c pap renal RCC could not be excluded).  Saw urol 03/06/16--stable on u/s 09/2016 and 09/2017.  . Osteoarthritis, hip, bilateral    she is s/p bilat THA as of 02/2018.  . Osteoporosis    radius, T score -2.5->rec'd alendronate 08/2019->plan rpt DEXA 08/2021.  Marland Kitchen Pneumonia   . Postmenopausal atrophic vaginitis    improved with estrace 2 X/week.  . Prolapse of vaginal cuff after hysterectomy    10/2018: Dr. Wayne Both. Mild  colpocele 04/2019 per GYN (no surgery)  . Pulmonary nodule    CT chest 12/10, June 2011.  No nodule on CT 06/2010.  Marland Kitchen Recurrent Clostridium difficile diarrhea    Episode 09/19/2016: resolved with prolonged course of flagyl.  11/2016 recurrence treated with 10d of Dificid (Fidaxomicin) by GI (Dr. Earlean Shawl).  . Recurrent UTI    likely related to pt's atrophic vaginitis.  Urol started vaginal estrace 03/2016.  . Rib fracture 09/2018   R 7th, laterally-->sustained when her dog pulled her over and she fell.  Marland Kitchen RLS (restless legs syndrome)    neuro->gabapentin trial 2020/21  . Shingles 02/2014   R abd/flank  . Spinal stenosis of lumbar region with neurogenic claudication    2008 MRI (L3-4, L4-5, L5-S1).  +S1 distribution sensory loss bilat    Surgical History: Past Surgical History:  Procedure Laterality Date  . ABDOMINAL HYSTERECTOMY  1978   no BSO per pt--nonmalignant reasons  . APPENDECTOMY    . BREAST CYST EXCISION     Last screening mammogram 10/2010 was normal.  . CHOLECYSTECTOMY  2001  . COLONOSCOPY  04/2005; 12/06/2015   2007 NORMAL.  2017 adenomatous polyp x 1.  Mod sigmoid diverticulosis (Dr. Earlean Shawl).    Marland Kitchen DEXA  05/18/2017; 08/7019   2019 NORMAL (T-score 0.1).  08/2019->T score -2.5->alendronate started->plan Rpt 08/2021.  Marland Kitchen HIP CLOSED REDUCTION Right 12/25/2015   Procedure: CLOSED REDUCTION HIP;  Surgeon: Nicholes Stairs, MD;  Location: [facility];  Service: Orthopedics;  Laterality: Right;  . NCV/EMS  10/2018   chronic and symmetric sensorimotor axonal polyneuropathy affecting the lower extremities  . TONSILLECTOMY    . TOTAL HIP ARTHROPLASTY Right 11/01/2014   Procedure: RIGHT TOTAL HIP ARTHROPLASTY;  Surgeon: Latanya Maudlin, MD;  Location: [facility];  Service: Orthopedics;  Laterality: Right;  . TOTAL HIP ARTHROPLASTY Left 12/08/2017   Procedure: LEFT TOTAL HIP ARTHROPLASTY ANTERIOR APPROACH;  Surgeon: Paralee Cancel, MD;  Location: [facility];  Service: Orthopedics;  Laterality: Left;  70  mins  . TRANSTHORACIC ECHOCARDIOGRAM  08/2014   EF 60-65%, grade I diast dysfxn  . TUBAL LIGATION  1974    Home Medications:  Allergies as of 05/25/2020      Reactions   Losartan Other (See Comments)   hyperkalemia   Penicillins Hives, Swelling, Rash   Has patient had a PCN reaction causing immediate rash, facial/tongue/throat swelling, SOB or lightheadedness with hypotension: YES Has patient had a PCN reaction causing severe rash involving mucus membranes or skin necrosis: NO Has patient had a PCN reaction that required hospitalization NO Has patient had a PCN reaction occurring within the last 10 years: NO If all of the above answers are "NO", then may proceed with Cephalosporin use.   Cefixime [kdc:cefixime] Other (See Comments)   unspecified   Gabapentin Other (See Comments)   Feels woozy   Indocin [indomethacin] Other (See Comments)   Painful tongue and throat   Singulair [montelukast Sodium]    Chest tightness, decreased mental clarity  Sulfa Antibiotics Other (See Comments)   unspecified   Ace Inhibitors Other (See Comments)   cough   Statins Other (See Comments)   Myalgias      Medication List       Accurate as of May 25, 2020 12:10 PM. If you have any questions, ask your nurse or doctor.        albuterol 108 (90 Base) MCG/ACT inhaler Commonly known as: ProAir HFA Inhale 2 puffs into the lungs every 4 (four) hours as needed for wheezing or shortness of breath.   alendronate 70 MG tablet Commonly known as: FOSAMAX Take 1 tablet (70 mg total) by mouth once a week. Take with a full glass of water on an empty stomach.   amLODipine 5 MG tablet Commonly known as: NORVASC TAKE 1 TABLET(5 MG) BY MOUTH DAILY   aspirin EC 81 MG tablet Take 81 mg by mouth daily.   azelastine 0.1 % nasal spray Commonly known as: ASTELIN Place 2 sprays into both nostrils 2 (two) times daily. Use in each nostril as directed   cetirizine 10 MG tablet Commonly known as:  ZYRTEC Take 1 tablet (10 mg total) by mouth daily.   clobetasol 0.05 % external solution Commonly known as: TEMOVATE APPLY TO THE AFFECTED AREA EVERY DAY   diazepam 5 MG tablet Commonly known as: VALIUM TAKE 1 TABLET BY MOUTH DAILY AS NEEDED FOR ANXIETY AND INSOMNIA   fluorometholone 0.1 % ophthalmic suspension Commonly known as: FML Place 1 drop into both eyes as directed. Tapering off   fluticasone 50 MCG/ACT nasal spray Commonly known as: FLONASE Place 2 sprays into both nostrils daily.   furosemide 20 MG tablet Commonly known as: LASIX TAKE 1 TABLET BY MOUTH EVERY DAY AS NEEDED FOR INCREASED FLUID COLLECTION IN LEGS   mirabegron ER 25 MG Tb24 tablet Commonly known as: MYRBETRIQ Take 1 tablet (25 mg total) by mouth daily.   nitrofurantoin (macrocrystal-monohydrate) 100 MG capsule Commonly known as: MACROBID Take 1 capsule (100 mg total) by mouth every 12 (twelve) hours.   nitrofurantoin 50 MG capsule Commonly known as: MACRODANTIN Take 1 capsule (50 mg total) by mouth at bedtime.   PROBIOTIC DAILY PO Take 1 capsule by mouth daily.   roflumilast 500 MCG Tabs tablet Commonly known as: DALIRESP Take 1 tablet (500 mcg total) by mouth daily.   Soothe XP Soln Place 1 drop into both eyes 2 (two) times daily as needed (irritation).   Synthroid 88 MCG tablet Generic drug: levothyroxine TAKE 1 TABLET(88 MCG) BY MOUTH DAILY   Trelegy Ellipta 100-62.5-25 MCG/INH Aepb Generic drug: Fluticasone-Umeclidin-Vilant Inhale 1 puff into the lungs daily.   triamcinolone cream 0.1 % Commonly known as: KENALOG Apply 1 application topically 2 (two) times daily.   venlafaxine 50 MG tablet Commonly known as: EFFEXOR TAKE 1 TABLET BY MOUTH TWICE DAILY   Visine 0.05 % ophthalmic solution Generic drug: tetrahydrozoline 1 drop  as needed   vitamin B-12 100 MCG tablet Commonly known as: CYANOCOBALAMIN Take 100 mcg by mouth daily.   VITAMIN D PO Take by mouth.        Allergies:  Allergies  Allergen Reactions  . Losartan Other (See Comments)    hyperkalemia  . Penicillins Hives, Swelling and Rash    Has patient had a PCN reaction causing immediate rash, facial/tongue/throat swelling, SOB or lightheadedness with hypotension: YES Has patient had a PCN reaction causing severe rash involving mucus membranes or skin necrosis: NO Has patient had a PCN reaction  that required hospitalization NO Has patient had a PCN reaction occurring within the last 10 years: NO If all of the above answers are "NO", then may proceed with Cephalosporin use.   . Cefixime [Kdc:Cefixime] Other (See Comments)    unspecified  . Gabapentin Other (See Comments)    Feels woozy  . Indocin [Indomethacin] Other (See Comments)    Painful tongue and throat  . Singulair [Montelukast Sodium]     Chest tightness, decreased mental clarity  . Sulfa Antibiotics Other (See Comments)    unspecified  . Ace Inhibitors Other (See Comments)    cough  . Statins Other (See Comments)    Myalgias     Family History: Family History  Problem Relation Age of Onset  . Goiter Mother   . Psoriasis Father        od liver  . Cirrhosis Father   . Diabetes Daughter        Type 2  . Stroke Maternal Grandfather   . Stroke Paternal Grandmother   . Hypertension Paternal Grandmother   . Hernia Paternal Grandmother   . Cancer Paternal Grandfather        lung  . Cancer Maternal Aunt        cancer of the stomach    Social History:  reports that she quit smoking about 30 years ago. Her smoking use included cigarettes. She started smoking about 64 years ago. She has a 51.00 pack-year smoking history. She has never used smokeless tobacco. She reports current alcohol use. She reports that she does not use drugs.  ROS: All other review of systems were reviewed and are negative except what is noted above in HPI  Physical Exam: BP 108/71   Pulse 81   Temp 98 F (36.7 C)   Ht 5' (1.524 m)   Wt  126 lb (57.2 kg)   BMI 24.61 kg/m   Constitutional:  Alert and oriented, No acute distress. HEENT:  AT, moist mucus membranes.  Trachea midline, no masses. Cardiovascular: No clubbing, cyanosis, or edema. Respiratory: Normal respiratory effort, no increased work of breathing. GI: Abdomen is soft, nontender, nondistended, no abdominal masses GU: No CVA tenderness.  Lymph: No cervical or inguinal lymphadenopathy. Skin: No rashes, bruises or suspicious lesions. Neurologic: Grossly intact, no focal deficits, moving all 4 extremities. Psychiatric: Normal mood and affect.  Laboratory Data: Lab Results  Component Value Date   WBC 6.2 05/09/2020   HGB 14.5 05/09/2020   HCT 44.0 05/09/2020   MCV 89.8 05/09/2020   PLT 255.0 05/09/2020    Lab Results  Component Value Date   CREATININE 0.91 05/09/2020    No results found for: PSA  No results found for: TESTOSTERONE  Lab Results  Component Value Date   HGBA1C 5.7 10/29/2011    Urinalysis    Component Value Date/Time   COLORURINE YELLOW 02/12/2020 1013   APPEARANCEUR Clear 02/24/2020 1058   LABSPEC 1.020 02/12/2020 1013   PHURINE 6.5 02/12/2020 1013   GLUCOSEU Negative 02/24/2020 1058   GLUCOSEU NEGATIVE 06/14/2015 1452   HGBUR LARGE (A) 02/12/2020 1013   BILIRUBINUR Negative 02/24/2020 1058   KETONESUR NEGATIVE 02/12/2020 1013   PROTEINUR Negative 02/24/2020 1058   PROTEINUR >300 (A) 02/12/2020 1013   UROBILINOGEN 0.2 09/01/2018 1147   UROBILINOGEN 0.2 06/14/2015 1452   NITRITE Negative 02/24/2020 1058   NITRITE NEGATIVE 02/12/2020 1013   LEUKOCYTESUR 1+ (A) 02/24/2020 1058   LEUKOCYTESUR MODERATE (A) 02/12/2020 1013    Lab Results  Component Value  Date   LABMICR See below: 02/24/2020   WBCUA 6-10 (A) 02/24/2020   LABEPIT 0-10 02/24/2020   BACTERIA Many (A) 02/24/2020    Pertinent Imaging:  Results for orders placed during the hospital encounter of 06/06/15  DG Abd 1 View  Narrative CLINICAL DATA:  Anal  leakage that she states is not diarhhea x 2 weeks. Pt denies pain or nausea or vomiting. She states she had shingles a couple of months ago but finished up her antibiotics x 1 month ago.  EXAM: ABDOMEN - 1 VIEW  COMPARISON:  None.  FINDINGS: There is no bowel dilatation to suggest obstruction. There is no evidence of pneumoperitoneum, portal venous gas or pneumatosis. There are no pathologic calcifications along the expected course of the ureters. There is a right total hip arthroplasty. There is moderate osteoarthritis of the left hip. There is lumbar spine spondylosis.  IMPRESSION: Unremarkable KUB.   Electronically Signed By: Kathreen Devoid On: 06/06/2015 16:07  Results for orders placed during the hospital encounter of 09/02/17  US Venous Img Lower Bilateral  Narrative CLINICAL DATA:  Bilateral lower extremity pain and edema. History of chronic mild venous insufficiency. History of varicose veins. Evaluate for DVT.  EXAM: BILATERAL LOWER EXTREMITY VENOUS DOPPLER ULTRASOUND  TECHNIQUE: Gray-scale sonography with graded compression, as well as color Doppler and duplex ultrasound were performed to evaluate the lower extremity deep venous systems from the level of the common femoral vein and including the common femoral, femoral, profunda femoral, popliteal and calf veins including the posterior tibial, peroneal and gastrocnemius veins when visible. The superficial great saphenous vein was also interrogated. Spectral Doppler was utilized to evaluate flow at rest and with distal augmentation maneuvers in the common femoral, femoral and popliteal veins.  COMPARISON:  None.  FINDINGS: RIGHT LOWER EXTREMITY  Common Femoral Vein: No evidence of thrombus. Normal compressibility, respiratory phasicity and response to augmentation.  Saphenofemoral Junction: No evidence of thrombus. Normal compressibility and flow on color Doppler imaging.  Profunda Femoral Vein: No  evidence of thrombus. Normal compressibility and flow on color Doppler imaging.  Femoral Vein: No evidence of thrombus. Normal compressibility, respiratory phasicity and response to augmentation.  Popliteal Vein: No evidence of thrombus. Normal compressibility, respiratory phasicity and response to augmentation.  Calf Veins: No evidence of thrombus. Normal compressibility and flow on color Doppler imaging.  Superficial Great Saphenous Vein: No evidence of thrombus. Normal compressibility.  Venous Reflux:  None.  Other Findings: There is an approximately 3.8 x 1.6 x 3.3 cm anechoic serpiginous fluid collection with the right popliteal fossa compatible with a Baker cyst.  LEFT LOWER EXTREMITY  Common Femoral Vein: No evidence of thrombus. Normal compressibility, respiratory phasicity and response to augmentation.  Saphenofemoral Junction: No evidence of thrombus. Normal compressibility and flow on color Doppler imaging.  Profunda Femoral Vein: No evidence of thrombus. Normal compressibility and flow on color Doppler imaging.  Femoral Vein: No evidence of thrombus. Normal compressibility, respiratory phasicity and response to augmentation.  Popliteal Vein: No evidence of thrombus. Normal compressibility, respiratory phasicity and response to augmentation.  Calf Veins: No evidence of thrombus. Normal compressibility and flow on color Doppler imaging.  Superficial Great Saphenous Vein: No evidence of thrombus. Normal compressibility.  Venous Reflux:  None.  Other Findings:  None.  IMPRESSION: 1. No evidence of DVT within either lower extremity. 2. Incidentally noted 3.8 cm right-sided Baker's cyst.   Electronically Signed By: Sandi Mariscal M.D. On: 09/02/2017 15:46  No results found for this or  any previous visit.  No results found for this or any previous visit.  No results found for this or any previous visit.  No results found for this or any previous  visit.  No results found for this or any previous visit.  No results found for this or any previous visit.   Assessment & Plan:    1. Chronic cystitis with hematuria -continue macrobid 50mg  qhs -RTC 6 months - Urinalysis, Routine w reflex microscopic   No follow-ups on file.  Nicolette Bang, MD  [facility] [place]

## 2020-05-31 ENCOUNTER — Telehealth: Payer: Self-pay | Admitting: Pulmonary Disease

## 2020-05-31 MED ORDER — ROFLUMILAST 500 MCG PO TABS: 500.0000 ug | ORAL_TABLET | Freq: Every day | ORAL | 3 refills | Status: DC

## 2020-05-31 NOTE — Telephone Encounter (Signed)
Nothing further needed 

## 2020-05-31 NOTE — Telephone Encounter (Signed)
[place] regarding the Daliresp prescription needed.  Spoke with Lexi, confirmed patient information, placed on hold to be transferred to [facility]. Felicia was not available.   Lexi came to the phone and she verified that the patient needs a script for a 90 day supply with 3 refills.  Advised we would get it taken care of.   Script printed and APP signed it.  Form faxed to [facility].

## 2020-06-08 ENCOUNTER — Other Ambulatory Visit: Payer: Self-pay

## 2020-06-08 ENCOUNTER — Encounter: Payer: Self-pay | Admitting: Family Medicine

## 2020-06-08 ENCOUNTER — Ambulatory Visit (INDEPENDENT_AMBULATORY_CARE_PROVIDER_SITE_OTHER): Payer: Medicare Other | Admitting: Family Medicine

## 2020-06-08 VITALS — BP 104/61 | HR 70 | Temp 97.4°F | Resp 16 | Ht 60.0 in | Wt 129.6 lb

## 2020-06-08 DIAGNOSIS — I10 Essential (primary) hypertension: Secondary | ICD-10-CM | POA: Diagnosis not present

## 2020-06-08 DIAGNOSIS — R600 Localized edema: Secondary | ICD-10-CM

## 2020-06-08 NOTE — Progress Notes (Signed)
OFFICE VISIT  06/08/2020  CC:  Chief Complaint  Patient presents with  . Follow-up    HTN, edema    HPI:    Patient is a 85 y.o. Caucasian female who presents for "blood pressure, legs swelling".  Home bps 140s/90s lately, checking nearly daily lately whereas in the last couple months prior hadn't checked it very often.   A couple days ago she noted inc fluid in legs after working in yard for a couple hours and it hurt around lower legs and this scared her.  No change in Na intake lately. Takes lasix 20mg  qd to qod. The legs swelling has returned to her normal/baseline now.  ROS as above, plus--> no fevers, no CP, no SOB, no wheezing, no cough, no dizziness, no HAs, no rashes, no melena/hematochezia.  No polyuria or polydipsia.  No myalgias or arthralgias.  No focal weakness, paresthesias, or tremors.  No acute vision or hearing abnormalities.  No dysuria or unusual/new urinary urgency or frequency. No n/v/d or abd pain.  No palpitations.      Past Medical History:  Diagnosis Date  . Allergic rhinitis    with upper airway cough: as of 07/2017 pulm f/u she was instructed to use astelin, flonase, and saline more consistently  . BPPV (benign paroxysmal positional vertigo) 03/2018  . Chest pain, non-cardiac    Cardiac CT showed no signif obstructive dz, mildly elevated calcium level (Dr. [name]).  . Chronic cystitis with hematuria    [name]  . Chronic hypoxemic respiratory failure ([place]) 02/02/2015   2L oxygen 24/7  . Cold hands    NCS/EMGs normal.  Hand dysesthesias per neuro--reassured  . COPD (chronic obstructive pulmonary disease) (HCC)    GOLD 4.  spirometry 01/10/09 FEV 0.97(52%), FEV1% 47.  With chronic bronchitis as of 07/2017.  . DDD (degenerative disc disease), cervical    1998 MRI C5-6 impingemt (left).  MRI 2021 (neurol) 1 level canal stenosis, 1 level foraminal stenosis--->PT  . DDD (degenerative disc disease), lumbar   . Depression with anxiety  04/15/2011  . Diarrheal disease Summer 2017   ? 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).  As of 10/05/15 pt reports GI dx'd her with C diff and rx'd more flagyl and she is also on cholestyramine.  As of 02/2016, pt's sx's resolved completely.  . Diverticulosis of colon    Procto '97 and colonoscopy 2002  . Dysplastic nevus of upper extremity 04/2014   R tricep ([name])  . Edema of both lower extremities due to peripheral venous insufficiency    Varicosities bilat, swelling L>R.  R baker's cyst.  Got vein clinic eval 02/2018->sclerotherapy recommended but since no hemorrhage or ulceration, insurance will not cover the procedure.  . Family history of adverse reaction to anesthesia    mother died when patient age 42 receiving anesthesia for thyroid surgery  . Fibrocystic breast disease    w/fibroadenoma.  Bx's showed NO atypia (Dr. Margot Chimes).  Mammo neg 2008.  Marland Kitchen GERD (gastroesophageal reflux disease)   . History of double vision    Ophthalmologist, Dr. Herbert Deaner, is further evaluating this with MRI  orbits and limited brain (myesthenia gravis testing neg 07/2010)  . History of home oxygen therapy    uses 2 liters at hs  . Hypertension    EKG 03/2010 normal  . Hypothyroidism    Hashimoto's, dx'd 1989  . Idiopathic peripheral neuropathy 04/2018  Sensory (numb feet) S1 distribution bilat, c/w spinal stenosis sensory neuropathy (no pain). 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  Labs [place], MRI C spine->some spondylosis/stenosis.  UE NCS/EMS: normal 04/2019.  Marland Kitchen Lesion of right native kidney 2017   found on CT 02/2015.  F/u MRI 03/31/15 showed it to be smaller and less likely of any significance but repeat CT renal protocol in 34mo was recommended by radiology.  This was done 10/26/15 and showed no interval change in the lesion, but b/c the  lesion enhances with contrast, radiol rec'd urol consult (b/c pap renal RCC could not be excluded).  Saw urol 03/06/16--stable on u/s 09/2016 and 09/2017.  . Osteoarthritis, hip, bilateral    she is s/p bilat THA as of 02/2018.  . Osteoporosis    radius, T score -2.5->rec'd alendronate 08/2019->plan rpt DEXA 08/2021.  Marland Kitchen Pneumonia   . Postmenopausal atrophic vaginitis    improved with estrace 2 X/week.  . Prolapse of vaginal cuff after hysterectomy    10/2018: Dr. Wayne Both. Mild colpocele 04/2019 per GYN (no surgery)  . Pulmonary nodule    CT chest 12/10, June 2011.  No nodule on CT 06/2010.  Marland Kitchen Recurrent Clostridium difficile diarrhea    Episode 09/19/2016: resolved with prolonged course of flagyl.  11/2016 recurrence treated with 10d of Dificid (Fidaxomicin) by GI (Dr. Earlean Shawl).  . Recurrent UTI    likely related to pt's atrophic vaginitis.  Urol started vaginal estrace 03/2016.  . Rib fracture 09/2018   R 7th, laterally-->sustained when her dog pulled her over and she fell.  Marland Kitchen RLS (restless legs syndrome)    neuro->gabapentin trial 2020/21  . Shingles 02/2014   R abd/flank  . Spinal stenosis of lumbar region with neurogenic claudication    2008 MRI (L3-4, L4-5, L5-S1).  +S1 distribution sensory loss bilat    Past Surgical History:  Procedure Laterality Date  . ABDOMINAL HYSTERECTOMY  1978   no BSO per pt--nonmalignant reasons  . APPENDECTOMY    . BREAST CYST EXCISION     Last screening mammogram 10/2010 was normal.  . CHOLECYSTECTOMY  2001  . COLONOSCOPY  04/2005; 12/06/2015   2007 NORMAL.  2017 adenomatous polyp x 1.  Mod sigmoid diverticulosis (Dr. Earlean Shawl).    Marland Kitchen DEXA  05/18/2017; 08/7019   2019 NORMAL (T-score 0.1).  08/2019->T score -2.5->alendronate started->plan Rpt 08/2021.  Marland Kitchen HIP CLOSED REDUCTION Right 12/25/2015   Procedure: CLOSED REDUCTION HIP;  Surgeon: Nicholes Stairs, MD;  Location: [facility];  Service: Orthopedics;  Laterality: Right;  . NCV/EMS  10/2018   chronic and symmetric  sensorimotor axonal polyneuropathy affecting the lower extremities  . TONSILLECTOMY    . TOTAL HIP ARTHROPLASTY Right 11/01/2014   Procedure: RIGHT TOTAL HIP ARTHROPLASTY;  Surgeon: Latanya Maudlin, MD;  Location: [facility];  Service: Orthopedics;  Laterality: Right;  . TOTAL HIP ARTHROPLASTY Left 12/08/2017   Procedure: LEFT TOTAL HIP ARTHROPLASTY ANTERIOR APPROACH;  Surgeon: Paralee Cancel, MD;  Location: [facility];  Service: Orthopedics;  Laterality: Left;  70 mins  . TRANSTHORACIC ECHOCARDIOGRAM  08/2014   EF 60-65%, grade I diast dysfxn  . TUBAL LIGATION  1974    Outpatient Medications Prior to Visit  Medication Sig Dispense Refill  . alendronate (FOSAMAX) 70 MG tablet Take 1 tablet (70 mg total) by mouth once a week. Take with a full glass of water on an empty stomach. 12 tablet 3  . amLODipine (NORVASC) 5 MG tablet TAKE 1 TABLET(5 MG) BY  MOUTH DAILY 90 tablet 0  . Artificial Tear Solution (SOOTHE XP) SOLN Place 1 drop into both eyes 2 (two) times daily as needed (irritation).    Marland Kitchen aspirin EC 81 MG tablet Take 81 mg by mouth daily.    . cetirizine (ZYRTEC) 10 MG tablet Take 1 tablet (10 mg total) by mouth daily. 30 tablet 6  . clobetasol (TEMOVATE) 0.05 % external solution APPLY TO THE AFFECTED AREA EVERY DAY 50 mL 4  . diazepam (VALIUM) 5 MG tablet TAKE 1 TABLET BY MOUTH DAILY AS NEEDED FOR ANXIETY AND INSOMNIA 30 tablet 5  . fluorometholone (FML) 0.1 % ophthalmic suspension Place 1 drop into both eyes as directed. Tapering off    . Fluticasone-Umeclidin-Vilant (TRELEGY ELLIPTA) 100-62.5-25 MCG/INH AEPB Inhale 1 puff into the lungs daily. 60 each 12  . furosemide (LASIX) 20 MG tablet TAKE 1 TABLET BY MOUTH EVERY DAY AS NEEDED FOR INCREASED FLUID COLLECTION IN LEGS 30 tablet 3  . mirabegron ER (MYRBETRIQ) 25 MG TB24 tablet Take 1 tablet (25 mg total) by mouth daily. 30 tablet 3  . Probiotic Product (PROBIOTIC DAILY PO) Take 1 capsule by mouth daily.     . roflumilast (DALIRESP) 500 MCG TABS  tablet Take 1 tablet (500 mcg total) by mouth daily. 90 tablet 3  . SYNTHROID 88 MCG tablet TAKE 1 TABLET(88 MCG) BY MOUTH DAILY 30 tablet 5  . triamcinolone cream (KENALOG) 0.1 % Apply 1 application topically 2 (two) times daily. 30 g 0  . venlafaxine (EFFEXOR) 50 MG tablet TAKE 1 TABLET BY MOUTH TWICE DAILY 180 tablet 1  . VISINE 0.05 % ophthalmic solution 1 drop  as needed    . vitamin B-12 (CYANOCOBALAMIN) 100 MCG tablet Take 100 mcg by mouth daily.    Marland Kitchen VITAMIN D PO Take by mouth.    Marland Kitchen albuterol (PROAIR HFA) 108 (90 Base) MCG/ACT inhaler Inhale 2 puffs into the lungs every 4 (four) hours as needed for wheezing or shortness of breath. (Patient not taking: Reported on 06/08/2020) 8.5 g 1  . azelastine (ASTELIN) 0.1 % nasal spray Place 2 sprays into both nostrils 2 (two) times daily. Use in each nostril as directed (Patient not taking: Reported on 06/08/2020) 30 mL 3  . doxycycline (VIBRAMYCIN) 100 MG capsule Take 1 capsule (100 mg total) by mouth every 12 (twelve) hours. (Patient not taking: Reported on 06/08/2020) 20 capsule 0  . fluticasone (FLONASE) 50 MCG/ACT nasal spray Place 2 sprays into both nostrils daily. (Patient not taking: Reported on 06/08/2020) 16 g 6  . nitrofurantoin (MACRODANTIN) 50 MG capsule Take 1 capsule (50 mg total) by mouth at bedtime. (Patient not taking: Reported on 06/08/2020) 90 capsule 3  . nitrofurantoin, macrocrystal-monohydrate, (MACROBID) 100 MG capsule Take 1 capsule (100 mg total) by mouth every 12 (twelve) hours. (Patient not taking: Reported on 06/08/2020) 14 capsule 0   No facility-administered medications prior to visit.    Allergies  Allergen Reactions  . Losartan Other (See Comments)    hyperkalemia  . Penicillins Hives, Swelling and Rash    Has patient had a PCN reaction causing immediate rash, facial/tongue/throat swelling, SOB or lightheadedness with hypotension: YES Has patient had a PCN reaction causing severe rash involving mucus membranes or skin  necrosis: NO Has patient had a PCN reaction that required hospitalization NO Has patient had a PCN reaction occurring within the last 10 years: NO If all of the above answers are "NO", then may proceed with Cephalosporin use.   Marland Kitchen  Cefixime [Kdc:Cefixime] Other (See Comments)    unspecified  . Gabapentin Other (See Comments)    Feels woozy  . Indocin [Indomethacin] Other (See Comments)    Painful tongue and throat  . Singulair [Montelukast Sodium]     Chest tightness, decreased mental clarity  . Sulfa Antibiotics Other (See Comments)    unspecified  . Ace Inhibitors Other (See Comments)    cough  . Statins Other (See Comments)    Myalgias     ROS As per HPI  PE: Vitals with BMI 06/08/2020 05/25/2020 05/09/2020  Height 5\' 0"  5\' 0"  5\' 0"   Weight 129 lbs 10 oz 126 lbs 132 lbs 6 oz  BMI 25.31 [id number]  Systolic 123456 [id number] Q000111Q  Diastolic 61 71 80  Pulse 70 81 59  Some encounter information is confidential and restricted. Go to Review Flowsheets activity to see all data.  O2 sat 93% on RA today  Gen: Alert, well appearing.  Patient is oriented to person, place, time, and situation. AFFECT: pleasant, lucid thought and speech. CV: RRR, no m/r/g.   LUNGS: CTA bilat, nonlabored resps, good aeration in all lung fields. EXT: no clubbing or cyanosis.  NO PITTING or NONPITTING edema today.    LABS:    Chemistry      Component Value Date/Time   NA 141 05/09/2020 1347   K 3.9 05/09/2020 1347   CL 105 05/09/2020 1347   CO2 29 05/09/2020 1347   BUN 20 05/09/2020 1347   CREATININE 0.91 05/09/2020 1347   CREATININE 0.84 02/27/2016 1533      Component Value Date/Time   CALCIUM 9.5 05/09/2020 1347   ALKPHOS 105 05/09/2020 1347   AST 16 05/09/2020 1347   ALT 13 05/09/2020 1347   BILITOT 0.5 05/09/2020 1347     Lab Results  Component Value Date   WBC 6.2 05/09/2020   HGB 14.5 05/09/2020   HCT 44.0 05/09/2020   MCV 89.8 05/09/2020   PLT 255.0 05/09/2020   Lab Results   Component Value Date   HGBA1C 5.7 10/29/2011   Lab Results  Component Value Date   TSH 7.71 (H) 05/09/2020    IMPRESSION AND PLAN:  1) HTN: some mild elevations at home but low-normal in office today. Discussed approach of permissive HTN in this case, as long as her highs are only in 140/90 range.  If they start getting consistently higher we'll consider inc amlodipine or possibly prn hydralazine if it seems it is only sporadic/labile HTN pattern.  2) Chronic LE venous insuff edema: mild.  NO edema present today. She has more varicose veins/spider vein issue than she does edema issue. Nothing looks inflamed, though. Cont lasix 20mg  qd to qod prn.  Reassured today. No changes recommended at this time.  An After Visit Summary was printed and given to the patient.  FOLLOW UP: No follow-ups on file.  Signed:  Crissie Sickles, MD           06/08/2020

## 2020-06-15 ENCOUNTER — Other Ambulatory Visit: Payer: Self-pay | Admitting: *Deleted

## 2020-06-18 ENCOUNTER — Other Ambulatory Visit: Payer: Self-pay | Admitting: Family Medicine

## 2020-06-25 ENCOUNTER — Other Ambulatory Visit: Payer: Self-pay

## 2020-06-25 ENCOUNTER — Encounter: Payer: Self-pay | Admitting: Family Medicine

## 2020-06-25 ENCOUNTER — Telehealth: Payer: Self-pay | Admitting: Neurology

## 2020-06-25 ENCOUNTER — Ambulatory Visit (INDEPENDENT_AMBULATORY_CARE_PROVIDER_SITE_OTHER): Payer: Medicare Other | Admitting: Family Medicine

## 2020-06-25 VITALS — BP 140/74 | HR 58 | Temp 97.5°F | Resp 16 | Ht 60.0 in | Wt 130.6 lb

## 2020-06-25 DIAGNOSIS — S90122A Contusion of left lesser toe(s) without damage to nail, initial encounter: Secondary | ICD-10-CM | POA: Diagnosis not present

## 2020-06-25 NOTE — Patient Instructions (Signed)
Goals Addressed            This Visit's Progress   . (THN)Make and Keep All Appointments   On track    Timeframe:  Long-Range Goal Priority:  Medium Start Date:   [id number]                          Expected End Date:  [id number]                    Follow Up Date 1478295   - call to cancel if needed - keep a calendar with prescription refill dates - keep a calendar with appointment dates    Why is this important?   Part of staying healthy is seeing the doctor for follow-up care.  If you forget your appointments, there are some things you can do to stay on track.    Notes:     . (THN)Manage Fatigue (Tiredness)   On track    Timeframe:  Long-Range Goal Priority:  Medium Start Date: 62130865                            Expected End Date: [id number]                 Follow Up Date [id number]   - eat healthy - get outdoors every day (weather permitting) - use devices that will help like a cane, sock-puller or reacher    Why is this important?   Feeling tired or worn out is a common symptom of COPD (chronic obstructive pulmonary disease).  Learning when you feel your best and when you need rest is important.  Managing the tiredness (fatigue) will help you be active and enjoy life.     Notes:  [id number] Patient has been doing yard work. She takes rest periods    . [facility] My Emotions   On track    Timeframe:  Long-Range Goal Priority:  Medium Start Date:    36644034                         Expected End Date:      [id number]                Follow Up Date [id number]   - talk about feelings with a friend, family or spiritual advisor    Why is this important?   When you are stressed, down or upset, your body reacts too.  For example, your blood pressure may get higher; you may have a headache or stomachache.  When your emotions get the best of you, your body's ability to fight off cold and flu gets weak.  These steps will help you manage your emotions.     Notes:  Patient daughter  will be staying with her during recovery time.  Patient daughter has left/ Patient is feeling some loneliness [id number] Patient takes with friend and minister     . (THN)Track and Manage My Symptoms   On track    Timeframe:  Long-Range Goal Priority:  Medium Start Date:   60630160                          Expected End Date:    10932355 Follow Up Date [id number] - develop a rescue plan - eliminate symptom triggers at home - follow rescue plan if  symptoms flare-up - keep follow-up appointments    Why is this important?   Tracking your symptoms and other information about your health helps your doctor plan your care.  Write down the symptoms, the time of day, what you were doing and what medicine you are taking.  You will soon learn how to manage your symptoms.     Notes:  [id number] patient is monitoring symptoms. No COPD exacerbation    . (THN)Track and Manage My Triggers   On track    Timeframe:  Long-Range Goal Priority:  Medium Start Date: 25366440                            Expected End Date:   34742595                   Follow Up Date [id number] - identify and remove indoor air pollutants - limit outdoor activity during cold weather - listen for public air quality announcements every day    Why is this important?   Triggers are activities or things, like tobacco smoke or cold weather, that make your COPD (chronic obstructive pulmonary disease) flare-up.  Knowing these triggers helps you plan how to stay away from them.  When you cannot remove them, you can learn how to manage them.     Notes:

## 2020-06-25 NOTE — Telephone Encounter (Signed)
Spoke to pt --stated will call her PCP regarding the toe problems.

## 2020-06-25 NOTE — Telephone Encounter (Signed)
Patient called in stating she mashed 2 of her toes and now has a blood blister.

## 2020-06-25 NOTE — Patient Outreach (Signed)
[facility] [place]) Care Management  [facility] Care Manager  [id number] Late entry   Priscilla Cantrell 02/11/1935 [id number]  RN Health Coach telephone call to patient.  Hipaa compliance verified. Per patient she has been doing good. Per patient she has a little congestion. Patient does get short of breath on exertion. She has been doing yard work. The pollen does affect her some. Patient stated she can afford her medications. She has not had any recent falls. Patient is monitoring her symptoms.Patient has not had any COPD exacerbations since last outreach Patient has agreed to follow up outreach.  Encounter Medications:  Outpatient Encounter Medications as of 06/15/2020  Medication Sig  . albuterol (PROAIR HFA) 108 (90 Base) MCG/ACT inhaler Inhale 2 puffs into the lungs every 4 (four) hours as needed for wheezing or shortness of breath. (Patient not taking: No sig reported)  . alendronate (FOSAMAX) 70 MG tablet Take 1 tablet (70 mg total) by mouth once a week. Take with a full glass of water on an empty stomach.  . Artificial Tear Solution (SOOTHE XP) SOLN Place 1 drop into both eyes 2 (two) times daily as needed (irritation).  Marland Kitchen aspirin EC 81 MG tablet Take 81 mg by mouth daily.  Marland Kitchen azelastine (ASTELIN) 0.1 % nasal spray Place 2 sprays into both nostrils 2 (two) times daily. Use in each nostril as directed  . cetirizine (ZYRTEC) 10 MG tablet Take 1 tablet (10 mg total) by mouth daily.  . clobetasol (TEMOVATE) 0.05 % external solution APPLY TO THE AFFECTED AREA EVERY DAY  . diazepam (VALIUM) 5 MG tablet TAKE 1 TABLET BY MOUTH DAILY AS NEEDED FOR ANXIETY AND INSOMNIA  . doxycycline (VIBRAMYCIN) 100 MG capsule Take 1 capsule (100 mg total) by mouth every 12 (twelve) hours. (Patient not taking: No sig reported)  . fluorometholone (FML) 0.1 % ophthalmic suspension Place 1 drop into both eyes as directed. Tapering off  . fluticasone (FLONASE) 50 MCG/ACT nasal spray Place 2 sprays into both  nostrils daily.  . Fluticasone-Umeclidin-Vilant (TRELEGY ELLIPTA) 100-62.5-25 MCG/INH AEPB Inhale 1 puff into the lungs daily.  . furosemide (LASIX) 20 MG tablet TAKE 1 TABLET BY MOUTH EVERY DAY AS NEEDED FOR INCREASED FLUID COLLECTION IN LEGS  . mirabegron ER (MYRBETRIQ) 25 MG TB24 tablet Take 1 tablet (25 mg total) by mouth daily.  . nitrofurantoin (MACRODANTIN) 50 MG capsule Take 1 capsule (50 mg total) by mouth at bedtime. (Patient not taking: No sig reported)  . Probiotic Product (PROBIOTIC DAILY PO) Take 1 capsule by mouth daily.   . roflumilast (DALIRESP) 500 MCG TABS tablet Take 1 tablet (500 mcg total) by mouth daily.  Marland Kitchen SYNTHROID 88 MCG tablet TAKE 1 TABLET(88 MCG) BY MOUTH DAILY  . triamcinolone cream (KENALOG) 0.1 % Apply 1 application topically 2 (two) times daily. (Patient not taking: Reported on 06/25/2020)  . venlafaxine (EFFEXOR) 50 MG tablet TAKE 1 TABLET BY MOUTH TWICE DAILY  . VISINE 0.05 % ophthalmic solution 1 drop  as needed  . vitamin B-12 (CYANOCOBALAMIN) 100 MCG tablet Take 100 mcg by mouth daily.  Marland Kitchen VITAMIN D PO Take by mouth.  . [DISCONTINUED] amLODipine (NORVASC) 5 MG tablet TAKE 1 TABLET(5 MG) BY MOUTH DAILY   No facility-administered encounter medications on file as of 06/15/2020.    Functional Status:  In your present state of health, do you have any difficulty performing the following activities: 02/01/2020  Hearing? N  Vision? N  Difficulty concentrating or making decisions? N  Walking or  climbing stairs? N  Dressing or bathing? N  Doing errands, shopping? N  Preparing Food and eating ? N  Using the Toilet? N  In the past six months, have you accidently leaked urine? N  Do you have problems with loss of bowel control? Y  Comment occasionally  Managing your Medications? N  Managing your Finances? N  Housekeeping or managing your Housekeeping? N  Some recent data might be hidden    Fall/Depression Screening: Fall Risk  06/15/2020 04/26/2020 03/20/2020   Falls in the past year? 1 1 1   Number falls in past yr: 0 0 0  Injury with Fall? 1 0 1  Comment - - -  Risk for fall due to : History of fall(s) - History of fall(s)  Risk for fall due to: Comment - - -  Follow up Falls evaluation completed - -  Comment - - -   PHQ 2/9 Scores 02/01/2020 01/12/2020 04/05/2019 01/19/2019 11/03/2018 11/03/2018 08/20/2018  PHQ - 2 Score 1 0 1 0 0 0 0  PHQ- 9 Score - - - - - - -    Assessment:  Goals Addressed            This Visit's Progress   . (THN)Make and Keep All Appointments   On track    Timeframe:  Long-Range Goal Priority:  Medium Start Date:   [id number]                          Expected End Date:  37902409                    Follow Up Date 7353299   - call to cancel if needed - keep a calendar with prescription refill dates - keep a calendar with appointment dates    Why is this important?   Part of staying healthy is seeing the doctor for follow-up care.  If you forget your appointments, there are some things you can do to stay on track.    Notes:     . (THN)Manage Fatigue (Tiredness)   On track    Timeframe:  Long-Range Goal Priority:  Medium Start Date: 24268341                            Expected End Date: 96222979                 Follow Up Date [id number]   - eat healthy - get outdoors every day (weather permitting) - use devices that will help like a cane, sock-puller or reacher    Why is this important?   Feeling tired or worn out is a common symptom of COPD (chronic obstructive pulmonary disease).  Learning when you feel your best and when you need rest is important.  Managing the tiredness (fatigue) will help you be active and enjoy life.     Notes:  [id number] Patient has been doing yard work. She takes rest periods    . [facility] My Emotions   On track    Timeframe:  Long-Range Goal Priority:  Medium Start Date:    18563149                         Expected End Date:      70263785                Follow Up Date  38887579   - talk about feelings with a friend, family or spiritual advisor    Why is this important?   When you are stressed, down or upset, your body reacts too.  For example, your blood pressure may get higher; you may have a headache or stomachache.  When your emotions get the best of you, your body's ability to fight off cold and flu gets weak.  These steps will help you manage your emotions.     Notes:  Patient daughter will be staying with her during recovery time.  Patient daughter has left/ Patient is feeling some loneliness [id number] Patient takes with friend and minister     . (THN)Track and Manage My Symptoms   On track    Timeframe:  Long-Range Goal Priority:  Medium Start Date:   56153794                          Expected End Date:    32761470 Follow Up Date [id number] - develop a rescue plan - eliminate symptom triggers at home - follow rescue plan if symptoms flare-up - keep follow-up appointments    Why is this important?   Tracking your symptoms and other information about your health helps your doctor plan your care.  Write down the symptoms, the time of day, what you were doing and what medicine you are taking.  You will soon learn how to manage your symptoms.     Notes:  [id number] patient is monitoring symptoms. No COPD exacerbation    . (THN)Track and Manage My Triggers   On track    Timeframe:  Long-Range Goal Priority:  Medium Start Date: 38381840                            Expected End Date:   37543606                   Follow Up Date [id number] - identify and remove indoor air pollutants - limit outdoor activity during cold weather - listen for public air quality announcements every day    Why is this important?   Triggers are activities or things, like tobacco smoke or cold weather, that make your COPD (chronic obstructive pulmonary disease) flare-up.  Knowing these triggers helps you plan how to stay away from them.  When you cannot remove them,  you can learn how to manage them.     Notes:        Plan:  Follow-up:  Patient agrees to Care Plan and Follow-up. RN ordered free COVID testing kits for patient Patient will continue to monitor for symptoms of COPD exacerbation RN will follow up within the month of July RN sent PCP update assessment  [place] Management [phone]

## 2020-06-25 NOTE — Progress Notes (Signed)
OFFICE VISIT  06/25/2020  CC:  Chief Complaint  Patient presents with  . Blood blister    Dresser drawer fell on her foot 2 days ago. States toes sting a little bit but does not feel broke    HPI:    Patient is a 85 y.o. Caucasian female who presents for "smashed toes". A dresser drawer accidentally dropped on toes of her left foot 2 days ago. 2nd and 3rd toes bruised on top but no pain, can walk w/out problem.  Past Medical History:  Diagnosis Date  . Allergic rhinitis    with upper airway cough: as of 07/2017 pulm f/u she was instructed to use astelin, flonase, and saline more consistently  . BPPV (benign paroxysmal positional vertigo) 03/2018  . Chest pain, non-cardiac    Cardiac CT showed no signif obstructive dz, mildly elevated calcium level (Dr. [name]).  . Chronic cystitis with hematuria    [name]  . Chronic hypoxemic respiratory failure ([place]) 02/02/2015   2L oxygen 24/7  . Cold hands    NCS/EMGs normal.  Hand dysesthesias per neuro--reassured  . COPD (chronic obstructive pulmonary disease) (HCC)    GOLD 4.  spirometry 01/10/09 FEV 0.97(52%), FEV1% 47.  With chronic bronchitis as of 07/2017.  . DDD (degenerative disc disease), cervical    1998 MRI C5-6 impingemt (left).  MRI 2021 (neurol) 1 level canal stenosis, 1 level foraminal stenosis--->PT  . DDD (degenerative disc disease), lumbar   . Depression with anxiety 04/15/2011  . Diarrheal disease Summer 2017   ? 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).  As of 10/05/15 pt reports GI dx'd her with C diff and rx'd more flagyl and she is also on cholestyramine.  As of 02/2016, pt's sx's resolved completely.  . Diverticulosis of colon    Procto '97 and colonoscopy 2002  . Dysplastic nevus of upper extremity 04/2014   R tricep ([name])  . Edema of both lower extremities due to peripheral venous insufficiency     Varicosities bilat, swelling L>R.  R baker's cyst.  Got vein clinic eval 02/2018->sclerotherapy recommended but since no hemorrhage or ulceration, insurance will not cover the procedure.  . Family history of adverse reaction to anesthesia    mother died when patient age 73 receiving anesthesia for thyroid surgery  . Fibrocystic breast disease    w/fibroadenoma.  Bx's showed NO atypia (Dr. Margot Chimes).  Mammo neg 2008.  Marland Kitchen GERD (gastroesophageal reflux disease)   . History of double vision    Ophthalmologist, Dr. Herbert Deaner, is further evaluating this with MRI  orbits and limited brain (myesthenia gravis testing neg 07/2010)  . History of home oxygen therapy    uses 2 liters at hs  . Hypertension    EKG 03/2010 normal  . Hypothyroidism    Hashimoto's, dx'd 1989  . Idiopathic peripheral neuropathy 04/2018   Sensory (numb feet) S1 distribution bilat, c/w spinal stenosis sensory neuropathy (no pain). 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  Labs [place], MRI C spine->some spondylosis/stenosis.  UE NCS/EMS: normal 04/2019.  Marland Kitchen Lesion of right native kidney 2017   found on CT 02/2015.  F/u MRI 03/31/15 showed it to be smaller and less likely of any significance but repeat CT renal protocol in 54mo was recommended by radiology.  This was done 10/26/15 and showed no interval change in the lesion, but b/c the lesion enhances with contrast,  radiol rec'd urol consult (b/c pap renal RCC could not be excluded).  Saw urol 03/06/16--stable on u/s 09/2016 and 09/2017.  . Osteoarthritis, hip, bilateral    she is s/p bilat THA as of 02/2018.  . Osteoporosis    radius, T score -2.5->rec'd alendronate 08/2019->plan rpt DEXA 08/2021.  Marland Kitchen Pneumonia   . Postmenopausal atrophic vaginitis    improved with estrace 2 X/week.  . Prolapse of vaginal cuff after hysterectomy    10/2018: Dr. Wayne Both. Mild colpocele 04/2019 per GYN (no surgery)  . Pulmonary nodule    CT chest 12/10,  June 2011.  No nodule on CT 06/2010.  Marland Kitchen Recurrent Clostridium difficile diarrhea    Episode 09/19/2016: resolved with prolonged course of flagyl.  11/2016 recurrence treated with 10d of Dificid (Fidaxomicin) by GI (Dr. Earlean Shawl).  . Recurrent UTI    likely related to pt's atrophic vaginitis.  Urol started vaginal estrace 03/2016.  . Rib fracture 09/2018   R 7th, laterally-->sustained when her dog pulled her over and she fell.  Marland Kitchen RLS (restless legs syndrome)    neuro->gabapentin trial 2020/21  . Shingles 02/2014   R abd/flank  . Spinal stenosis of lumbar region with neurogenic claudication    2008 MRI (L3-4, L4-5, L5-S1).  +S1 distribution sensory loss bilat    Past Surgical History:  Procedure Laterality Date  . ABDOMINAL HYSTERECTOMY  1978   no BSO per pt--nonmalignant reasons  . APPENDECTOMY    . BREAST CYST EXCISION     Last screening mammogram 10/2010 was normal.  . CHOLECYSTECTOMY  2001  . COLONOSCOPY  04/2005; 12/06/2015   2007 NORMAL.  2017 adenomatous polyp x 1.  Mod sigmoid diverticulosis (Dr. Earlean Shawl).    Marland Kitchen DEXA  05/18/2017; 08/7019   2019 NORMAL (T-score 0.1).  08/2019->T score -2.5->alendronate started->plan Rpt 08/2021.  Marland Kitchen HIP CLOSED REDUCTION Right 12/25/2015   Procedure: CLOSED REDUCTION HIP;  Surgeon: Nicholes Stairs, MD;  Location: [facility];  Service: Orthopedics;  Laterality: Right;  . NCV/EMS  10/2018   chronic and symmetric sensorimotor axonal polyneuropathy affecting the lower extremities  . TONSILLECTOMY    . TOTAL HIP ARTHROPLASTY Right 11/01/2014   Procedure: RIGHT TOTAL HIP ARTHROPLASTY;  Surgeon: Latanya Maudlin, MD;  Location: [facility];  Service: Orthopedics;  Laterality: Right;  . TOTAL HIP ARTHROPLASTY Left 12/08/2017   Procedure: LEFT TOTAL HIP ARTHROPLASTY ANTERIOR APPROACH;  Surgeon: Paralee Cancel, MD;  Location: [facility];  Service: Orthopedics;  Laterality: Left;  70 mins  . TRANSTHORACIC ECHOCARDIOGRAM  08/2014   EF 60-65%, grade I diast dysfxn  . TUBAL LIGATION   1974    Outpatient Medications Prior to Visit  Medication Sig Dispense Refill  . alendronate (FOSAMAX) 70 MG tablet Take 1 tablet (70 mg total) by mouth once a week. Take with a full glass of water on an empty stomach. 12 tablet 3  . amLODipine (NORVASC) 5 MG tablet TAKE 1 TABLET(5 MG) BY MOUTH DAILY 90 tablet 0  . Artificial Tear Solution (SOOTHE XP) SOLN Place 1 drop into both eyes 2 (two) times daily as needed (irritation).    Marland Kitchen aspirin EC 81 MG tablet Take 81 mg by mouth daily.    Marland Kitchen azelastine (ASTELIN) 0.1 % nasal spray Place 2 sprays into both nostrils 2 (two) times daily. Use in each nostril as directed 30 mL 3  . cetirizine (ZYRTEC) 10 MG tablet Take 1 tablet (10 mg total) by mouth daily. 30 tablet 6  . clobetasol (TEMOVATE) 0.05 % external  solution APPLY TO THE AFFECTED AREA EVERY DAY 50 mL 4  . diazepam (VALIUM) 5 MG tablet TAKE 1 TABLET BY MOUTH DAILY AS NEEDED FOR ANXIETY AND INSOMNIA 30 tablet 5  . fluorometholone (FML) 0.1 % ophthalmic suspension Place 1 drop into both eyes as directed. Tapering off    . fluticasone (FLONASE) 50 MCG/ACT nasal spray Place 2 sprays into both nostrils daily. 16 g 6  . Fluticasone-Umeclidin-Vilant (TRELEGY ELLIPTA) 100-62.5-25 MCG/INH AEPB Inhale 1 puff into the lungs daily. 60 each 12  . furosemide (LASIX) 20 MG tablet TAKE 1 TABLET BY MOUTH EVERY DAY AS NEEDED FOR INCREASED FLUID COLLECTION IN LEGS 30 tablet 3  . mirabegron ER (MYRBETRIQ) 25 MG TB24 tablet Take 1 tablet (25 mg total) by mouth daily. 30 tablet 3  . Probiotic Product (PROBIOTIC DAILY PO) Take 1 capsule by mouth daily.     . roflumilast (DALIRESP) 500 MCG TABS tablet Take 1 tablet (500 mcg total) by mouth daily. 90 tablet 3  . SYNTHROID 88 MCG tablet TAKE 1 TABLET(88 MCG) BY MOUTH DAILY 30 tablet 5  . venlafaxine (EFFEXOR) 50 MG tablet TAKE 1 TABLET BY MOUTH TWICE DAILY 180 tablet 1  . VISINE 0.05 % ophthalmic solution 1 drop  as needed    . vitamin B-12 (CYANOCOBALAMIN) 100 MCG  tablet Take 100 mcg by mouth daily.    Marland Kitchen VITAMIN D PO Take by mouth.    Marland Kitchen albuterol (PROAIR HFA) 108 (90 Base) MCG/ACT inhaler Inhale 2 puffs into the lungs every 4 (four) hours as needed for wheezing or shortness of breath. (Patient not taking: No sig reported) 8.5 g 1  . doxycycline (VIBRAMYCIN) 100 MG capsule Take 1 capsule (100 mg total) by mouth every 12 (twelve) hours. (Patient not taking: No sig reported) 20 capsule 0  . nitrofurantoin (MACRODANTIN) 50 MG capsule Take 1 capsule (50 mg total) by mouth at bedtime. (Patient not taking: No sig reported) 90 capsule 3  . triamcinolone cream (KENALOG) 0.1 % Apply 1 application topically 2 (two) times daily. (Patient not taking: Reported on 06/25/2020) 30 g 0   No facility-administered medications prior to visit.    Allergies  Allergen Reactions  . Losartan Other (See Comments)    hyperkalemia  . Penicillins Hives, Swelling and Rash    Has patient had a PCN reaction causing immediate rash, facial/tongue/throat swelling, SOB or lightheadedness with hypotension: YES Has patient had a PCN reaction causing severe rash involving mucus membranes or skin necrosis: NO Has patient had a PCN reaction that required hospitalization NO Has patient had a PCN reaction occurring within the last 10 years: NO If all of the above answers are "NO", then may proceed with Cephalosporin use.   . Cefixime [Kdc:Cefixime] Other (See Comments)    unspecified  . Gabapentin Other (See Comments)    Feels woozy  . Indocin [Indomethacin] Other (See Comments)    Painful tongue and throat  . Singulair [Montelukast Sodium]     Chest tightness, decreased mental clarity  . Sulfa Antibiotics Other (See Comments)    unspecified  . Ace Inhibitors Other (See Comments)    cough  . Statins Other (See Comments)    Myalgias     ROS As per HPI  PE: Vitals with BMI 06/25/2020 06/08/2020 05/25/2020  Height 5\' 0"  5\' 0"  5\' 0"   Weight 130 lbs 10 oz 129 lbs 10 oz 126 lbs  BMI  25.51 27.06 23.76  Systolic 283 151 761  Diastolic 74 61 71  Pulse 58 70 81  Some encounter information is confidential and restricted. Go to Review Flowsheets activity to see all data.   Gen: Alert, well appearing.  Patient is oriented to person, place, time, and situation. AFFECT: pleasant, lucid thought and speech. 1 cm blood-filled vesicle on dorsal aspect of distal phalanx of 2nd and 3rd toes of left foot. Toenails normal/intact.  No abrasion.  Can move toes normally.  Cap RF brisk. Minimal tenderness to palpation over distal phalanges of 2nd and 3rd toes L foot.  LABS:    Chemistry      Component Value Date/Time   NA 141 05/09/2020 1347   K 3.9 05/09/2020 1347   CL 105 05/09/2020 1347   CO2 29 05/09/2020 1347   BUN 20 05/09/2020 1347   CREATININE 0.91 05/09/2020 1347   CREATININE 0.84 02/27/2016 1533      Component Value Date/Time   CALCIUM 9.5 05/09/2020 1347   ALKPHOS 105 05/09/2020 1347   AST 16 05/09/2020 1347   ALT 13 05/09/2020 1347   BILITOT 0.5 05/09/2020 1347     IMPRESSION AND PLAN:  1) Hematoma of toes x 2, traumatic. I used 18g needle to perforate each one and milked out the contents.  No immediate complications.  Pt tolerated this well.  Band aid placed on each. She does not have toe fractures, fortunately. Recommended bid epsom salt soaks x 79m in over the next 3-4d.  An After Visit Summary was printed and given to the patient.  FOLLOW UP: Return if symptoms worsen or fail to improve.  Signed:  Crissie Sickles, MD           06/25/2020

## 2020-06-25 NOTE — Telephone Encounter (Signed)
Please ask her to see her PCP or urgent care for this.

## 2020-06-25 NOTE — Telephone Encounter (Signed)
Spoke to pt --stated tried to lift the dresser and smashed the Left 2nd/3rd toe which developed blood blister/bruises, tender since yesterday. Tried bandage and Tylenol for pain. Please advise

## 2020-07-10 ENCOUNTER — Other Ambulatory Visit: Payer: Self-pay

## 2020-07-10 ENCOUNTER — Ambulatory Visit (INDEPENDENT_AMBULATORY_CARE_PROVIDER_SITE_OTHER): Payer: Medicare Other

## 2020-07-10 DIAGNOSIS — E039 Hypothyroidism, unspecified: Secondary | ICD-10-CM | POA: Diagnosis not present

## 2020-07-10 LAB — TSH: TSH: 1.12 u[IU]/mL (ref 0.35–4.50)

## 2020-07-12 DIAGNOSIS — H02054 Trichiasis without entropian left upper eyelid: Secondary | ICD-10-CM | POA: Diagnosis not present

## 2020-07-12 DIAGNOSIS — H04123 Dry eye syndrome of bilateral lacrimal glands: Secondary | ICD-10-CM | POA: Diagnosis not present

## 2020-07-17 ENCOUNTER — Other Ambulatory Visit: Payer: Self-pay | Admitting: Family Medicine

## 2020-07-17 DIAGNOSIS — Z23 Encounter for immunization: Secondary | ICD-10-CM | POA: Diagnosis not present

## 2020-07-24 ENCOUNTER — Ambulatory Visit: Payer: Medicare Other | Admitting: Adult Health

## 2020-07-26 ENCOUNTER — Telehealth: Payer: Self-pay | Admitting: Neurology

## 2020-07-26 NOTE — Telephone Encounter (Signed)
Pt called to follow up to see about her breathing issues she stated her breathing problems come and go. she stated that she has a new appointment with pulmonary soon, but in the mean time she was told by someone at the pulmonologist office to call neurology because her breathing problems may be coming from her neuropathy? Pt advised that she needed to call her PCP and her pulmonologist to be evaluated for her breathing problems her neuropathy does not have anything to do with it. Pt stated she will wait until her upcoming appointment to be seen she said she has home O2 pt advised to use her O2 and that if her shortness of breath became any worse than she can manage she needs to call 911 and go to the ER to be treated pt verbalized understanding,

## 2020-07-26 NOTE — Telephone Encounter (Signed)
Pt would like to call back regarding some issues she is having. She is having issues breathing, and wants to know if neuro affect that. She had an appt with pulmonary,  but she missed it. She has COPD,  but it doesn't seem to be that. Please call [phone].

## 2020-07-27 NOTE — Telephone Encounter (Signed)
Agree with recommendations. She has peripheral neuropathy, which would not affect her breathing.  Her evaluation for diplopia with single fiber EMG also was negative for myasthenia gravis, therefore, shortness of breath is very unlikely to be stemming from primary neurological pathology.  Follow-up with PCP or pulmonology.

## 2020-07-30 ENCOUNTER — Telehealth: Payer: Self-pay

## 2020-07-30 NOTE — Telephone Encounter (Signed)
Pt calling to report dizziness. Pt sched for next appt with PCP

## 2020-08-01 ENCOUNTER — Other Ambulatory Visit: Payer: Self-pay

## 2020-08-01 ENCOUNTER — Ambulatory Visit (INDEPENDENT_AMBULATORY_CARE_PROVIDER_SITE_OTHER): Payer: Medicare Other | Admitting: Family Medicine

## 2020-08-01 ENCOUNTER — Encounter: Payer: Self-pay | Admitting: Family Medicine

## 2020-08-01 VITALS — BP 115/64 | HR 69 | Temp 97.5°F | Resp 16 | Ht 60.0 in | Wt 126.2 lb

## 2020-08-01 DIAGNOSIS — E878 Other disorders of electrolyte and fluid balance, not elsewhere classified: Secondary | ICD-10-CM

## 2020-08-01 MED ORDER — TETANUS-DIPHTH-ACELL PERTUSSIS 5-2-15.5 LF-MCG/0.5 IM SUSP: 0.5000 mL | Freq: Once | INTRAMUSCULAR | 0 refills | Status: AC

## 2020-08-01 NOTE — Progress Notes (Signed)
OFFICE VISIT  08/01/2020  CC:  Chief Complaint  Patient presents with   Dizziness    Started Monday; went to the fire dept and had her BP checked. Tues morning, it got worse and contacted eye MD, advised to contact PCP   HPI:    Patient is a 85 y.o. Caucasian female who presents for "dizziness". Onset 2 days ago-->upon arriving home from bible study she felt a bit lightheaded (no spinning of sensation).  No nausea or feeling of presyncope.  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  Denies veering/unstable with walking.  Denies palpitations or heart racing. Eating and drinking normally.  No hearing dec and no ringing in ears.  She went to fire station and bp was normal sitting and did not change with standing up, oxygen sat normal as well. Since that day it has come and gone some but not as severe sx's.  No recent change in meds.  Not taking any lasix lately. Taking valium nightly for insomnia/RLS and helping well.  ROS as above, plus--> no fevers, no CP, no SOB (stable chronic DOE c/w her copd), no wheezing, no cough, no HAs, no rashes, no melena/hematochezia.  No polyuria or polydipsia.  No myalgias or arthralgias.  No focal weakness, paresthesias, or tremors.  No acute vision or hearing abnormalities.  No dysuria or unusual/new urinary urgency or frequency.  No recent changes in lower legs. No n/v/d or abd pain.    Past Medical History:  Diagnosis Date   Allergic rhinitis    with upper airway cough: as of 07/2017 pulm f/u she was instructed to use astelin, flonase, and saline more consistently   BPPV (benign paroxysmal positional vertigo) 03/2018   Chest pain, non-cardiac    Cardiac CT showed no signif obstructive dz, mildly elevated calcium level (Dr. [name]).   Chronic cystitis with hematuria    [name]   Chronic hypoxemic respiratory failure ([place]) 02/02/2015   2L oxygen 24/7   Cold hands    NCS/EMGs normal.  Hand  dysesthesias per neuro--reassured   COPD (chronic obstructive pulmonary disease) ([place])    GOLD 4.  spirometry 01/10/09 FEV 0.97(52%), FEV1% 47.  With chronic bronchitis as of 07/2017.   DDD (degenerative disc disease), cervical    1998 MRI C5-6 impingemt (left).  MRI 2021 (neurol) 1 level canal stenosis, 1 level foraminal stenosis--->PT   DDD (degenerative disc disease), lumbar    Depression with anxiety 04/15/2011   Diarrheal disease Summer 2017   ? 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).  As of 10/05/15 pt reports GI dx'd her with C diff and rx'd more flagyl and she is also on cholestyramine.  As of 02/2016, pt's sx's resolved completely.   Diverticulosis of colon    Procto '97 and colonoscopy 2002   Dysplastic nevus of upper extremity 04/2014   R tricep ([name])   Edema of both lower extremities due to peripheral venous insufficiency    Varicosities bilat, swelling L>R.  R baker's cyst.  Got vein clinic eval 02/2018->sclerotherapy recommended but since no hemorrhage or ulceration, insurance will not cover the procedure.   Family history of adverse reaction to anesthesia    mother died when patient age 23 receiving anesthesia for thyroid surgery   Fibrocystic breast disease    w/fibroadenoma.  Bx's showed NO atypia (Dr. Margot Chimes).  Mammo neg 2008.  GERD (gastroesophageal reflux disease)    History of double vision    Ophthalmologist, Dr. Herbert Deaner, is further evaluating this with MRI  orbits and limited brain (myesthenia gravis testing neg 07/2010)   History of home oxygen therapy    uses 2 liters at hs   Hypertension    EKG 03/2010 normal   Hypothyroidism    Hashimoto's, dx'd 1989   Idiopathic peripheral neuropathy 04/2018   Sensory (numb feet) S1 distribution bilat, c/w spinal stenosis sensory neuropathy (no pain). 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  Labs [place], MRI C spine->some spondylosis/stenosis.  UE NCS/EMS: normal 04/2019.   Lesion of right native kidney 2017   found on CT 02/2015.  F/u MRI 03/31/15 showed it to be smaller and less likely of any significance but repeat CT renal protocol in 35mo was recommended by radiology.  This was done 10/26/15 and showed no interval change in the lesion, but b/c the lesion enhances with contrast, radiol rec'd urol consult (b/c pap renal RCC could not be excluded).  Saw urol 03/06/16--stable on u/s 09/2016 and 09/2017.   Osteoarthritis, hip, bilateral    she is s/p bilat THA as of 02/2018.   Osteoporosis    radius, T score -2.5->rec'd alendronate 08/2019->plan rpt DEXA 08/2021.   Pneumonia    Postmenopausal atrophic vaginitis    improved with estrace 2 X/week.   Prolapse of vaginal cuff after hysterectomy    10/2018: Dr. Wayne Both. Mild colpocele 04/2019 per GYN (no surgery)   Pulmonary nodule    CT chest 12/10, June 2011.  No nodule on CT 06/2010.   Recurrent Clostridium difficile diarrhea    Episode 09/19/2016: resolved with prolonged course of flagyl.  11/2016 recurrence treated with 10d of Dificid (Fidaxomicin) by GI (Dr. Earlean Shawl).   Recurrent UTI    likely related to pt's atrophic vaginitis.  Urol started vaginal estrace 03/2016.   Rib fracture 09/2018   R 7th, laterally-->sustained when her dog pulled her over and she fell.   RLS (restless legs syndrome)    neuro->gabapentin trial 2020/21   Shingles 02/2014   R abd/flank   Spinal stenosis of lumbar region with neurogenic claudication    2008 MRI (L3-4, L4-5, L5-S1).  +S1 distribution sensory loss bilat    Past Surgical History:  Procedure Laterality Date   ABDOMINAL HYSTERECTOMY  1978   no BSO per pt--nonmalignant reasons   APPENDECTOMY     BREAST CYST EXCISION     Last screening mammogram 10/2010 was normal.   CHOLECYSTECTOMY  2001   COLONOSCOPY  04/2005; 12/06/2015   2007 NORMAL.  2017 adenomatous polyp x 1.   Mod sigmoid diverticulosis (Dr. Earlean Shawl).     DEXA  05/18/2017; 08/7019   2019 NORMAL (T-score 0.1).  08/2019->T score -2.5->alendronate started->plan Rpt 08/2021.   HIP CLOSED REDUCTION Right 12/25/2015   Procedure: CLOSED REDUCTION HIP;  Surgeon: Nicholes Stairs, MD;  Location: [facility];  Service: Orthopedics;  Laterality: Right;   NCV/EMS  10/2018   chronic and symmetric sensorimotor axonal polyneuropathy affecting the lower extremities   TONSILLECTOMY     TOTAL HIP ARTHROPLASTY Right 11/01/2014   Procedure: RIGHT TOTAL HIP ARTHROPLASTY;  Surgeon: Latanya Maudlin, MD;  Location: [facility];  Service: Orthopedics;  Laterality: Right;   TOTAL HIP ARTHROPLASTY Left 12/08/2017   Procedure: LEFT TOTAL HIP ARTHROPLASTY ANTERIOR APPROACH;  Surgeon: Paralee Cancel, MD;  Location: [facility];  Service: Orthopedics;  Laterality: Left;  70 mins   TRANSTHORACIC  ECHOCARDIOGRAM  08/2014   EF 60-65%, grade I diast dysfxn   TUBAL LIGATION  1974    Outpatient Medications Prior to Visit  Medication Sig Dispense Refill   albuterol (PROAIR HFA) 108 (90 Base) MCG/ACT inhaler Inhale 2 puffs into the lungs every 4 (four) hours as needed for wheezing or shortness of breath. 8.5 g 1   alendronate (FOSAMAX) 70 MG tablet Take 1 tablet (70 mg total) by mouth once a week. Take with a full glass of water on an empty stomach. 12 tablet 3   amLODipine (NORVASC) 5 MG tablet TAKE 1 TABLET(5 MG) BY MOUTH DAILY 90 tablet 0   Artificial Tear Solution (SOOTHE XP) SOLN Place 1 drop into both eyes 2 (two) times daily as needed (irritation).     aspirin EC 81 MG tablet Take 81 mg by mouth daily.     azelastine (ASTELIN) 0.1 % nasal spray Place 2 sprays into both nostrils 2 (two) times daily. Use in each nostril as directed 30 mL 3   cetirizine (ZYRTEC) 10 MG tablet Take 1 tablet (10 mg total) by mouth daily. 30 tablet 6   clobetasol (TEMOVATE) 0.05 % external solution APPLY TO THE AFFECTED AREA EVERY DAY 50 mL 4   diazepam (VALIUM) 5 MG  tablet TAKE 1 TABLET BY MOUTH DAILY AS NEEDED FOR ANXIETY AND INSOMNIA 30 tablet 5   fluorometholone (FML) 0.1 % ophthalmic suspension Place 1 drop into both eyes as directed. Tapering off     fluticasone (FLONASE) 50 MCG/ACT nasal spray Place 2 sprays into both nostrils daily. 16 g 6   Fluticasone-Umeclidin-Vilant (TRELEGY ELLIPTA) 100-62.5-25 MCG/INH AEPB Inhale 1 puff into the lungs daily. 60 each 12   furosemide (LASIX) 20 MG tablet TAKE 1 TABLET BY MOUTH EVERY DAY AS NEEDED FOR INCREASED FLUID COLLECTION IN LEGS 30 tablet 3   mirabegron ER (MYRBETRIQ) 25 MG TB24 tablet Take 1 tablet (25 mg total) by mouth daily. 30 tablet 3   Probiotic Product (PROBIOTIC DAILY PO) Take 1 capsule by mouth daily.      roflumilast (DALIRESP) 500 MCG TABS tablet Take 1 tablet (500 mcg total) by mouth daily. 90 tablet 3   SYNTHROID 88 MCG tablet TAKE 1 TABLET(88 MCG) BY MOUTH DAILY 30 tablet 5   venlafaxine (EFFEXOR) 50 MG tablet TAKE 1 TABLET BY MOUTH TWICE DAILY 180 tablet 1   VISINE 0.05 % ophthalmic solution 1 drop  as needed     vitamin B-12 (CYANOCOBALAMIN) 100 MCG tablet Take 100 mcg by mouth daily.     VITAMIN D PO Take by mouth.     doxycycline (VIBRAMYCIN) 100 MG capsule Take 1 capsule (100 mg total) by mouth every 12 (twelve) hours. (Patient not taking: No sig reported) 20 capsule 0   nitrofurantoin (MACRODANTIN) 50 MG capsule Take 1 capsule (50 mg total) by mouth at bedtime. (Patient not taking: No sig reported) 90 capsule 3   triamcinolone cream (KENALOG) 0.1 % Apply 1 application topically 2 (two) times daily. (Patient not taking: No sig reported) 30 g 0   No facility-administered medications prior to visit.    Allergies  Allergen Reactions   Losartan Other (See Comments)    hyperkalemia   Penicillins Hives, Swelling and Rash    Has patient had a PCN reaction causing immediate rash, facial/tongue/throat swelling, SOB or lightheadedness with hypotension: YES Has patient had a PCN reaction  causing severe rash involving mucus membranes or skin necrosis: NO Has patient had a PCN reaction that  required hospitalization NO Has patient had a PCN reaction occurring within the last 10 years: NO If all of the above answers are "NO", then may proceed with Cephalosporin use.    Cefixime [Kdc:Cefixime] Other (See Comments)    unspecified   Gabapentin Other (See Comments)    Feels woozy   Indocin [Indomethacin] Other (See Comments)    Painful tongue and throat   Singulair [Montelukast Sodium]     Chest tightness, decreased mental clarity   Sulfa Antibiotics Other (See Comments)    unspecified   Ace Inhibitors Other (See Comments)    cough   Statins Other (See Comments)    Myalgias     ROS As per HPI  PE: Vitals with BMI 08/01/2020 06/25/2020 06/08/2020  Height 5\' 0"  5\' 0"  5\' 0"   Weight 126 lbs 3 oz 130 lbs 10 oz 129 lbs 10 oz  BMI 24.65 81.82 99.37  Systolic 169 678 938  Diastolic 64 74 61  Pulse 69 58 70  Some encounter information is confidential and restricted. Go to Review Flowsheets activity to see all data.    Gen: Alert, well appearing.  Patient is oriented to person, place, time, and situation. AFFECT: pleasant, lucid thought and speech. ENT: Ears: L EAC clear, normal epithelium. TM normal.  R EAC mostly occluded with cerumen, can't see TM.  Eyes: no injection, icteris, swelling, or exudate.  EOMI, PERRLA. Nose: no drainage or turbinate edema/swelling.  No injection or focal lesion.  Mouth: lips without lesion/swelling.  Oral mucosa pink and moist.  Dentition intact and without obvious caries or gingival swelling.  Oropharynx without erythema, exudate, or swelling.  Neck: no bruits. CV: RRR, no m/r/g.   LUNGS: CTA bilat, nonlabored resps, good aeration in all lung fields. ABD: soft, NT/ND EXT: no clubbing or cyanosis.  no edema.  Neuro: CN 2-12 intact bilaterally, strength 5/5 in proximal and distal upper extremities and lower extremities bilaterally.  No sensory  deficits.  No tremor.  No disdiadochokinesis.  No ataxia.  Upper extremity and lower extremity DTRs symmetric.  No pronator drift.   LABS:  Lab Results  Component Value Date   TSH 1.12 07/10/2020   Lab Results  Component Value Date   WBC 6.2 05/09/2020   HGB 14.5 05/09/2020   HCT 44.0 05/09/2020   MCV 89.8 05/09/2020   PLT 255.0 05/09/2020   Lab Results  Component Value Date   CREATININE 0.91 05/09/2020   BUN 20 05/09/2020   NA 141 05/09/2020   K 3.9 05/09/2020   CL 105 05/09/2020   CO2 29 05/09/2020   Lab Results  Component Value Date   ALT 13 05/09/2020   AST 16 05/09/2020   ALKPHOS 105 05/09/2020   BILITOT 0.5 05/09/2020   Lab Results  Component Value Date   CHOL 211 (H) 05/26/2018   Lab Results  Component Value Date   HDL 68.20 05/26/2018   Lab Results  Component Value Date   LDLCALC 127 (H) 05/26/2018   Lab Results  Component Value Date   TRIG 79.0 05/26/2018   Lab Results  Component Value Date   CHOLHDL 3 05/26/2018   Lab Results  Component Value Date   HGBA1C 5.7 10/29/2011    IMPRESSION AND PLAN:  1) Disequilibrium/dizzy-->mild and improving, etiology unknown. Anxiety contributing but not the sole cause.  Given history and exam today I have very low suspicion of cns ischemia or cardiac etiology. No sign of dehydration or low bp issues.  This does not  match inner ear pathology. I don't think any labs will help [place] and I don't feel that any imaging is indicated at this time. Reassured, told to obs sx's and she'll call or return for any new developments.  Spent 32 min with pt today reviewing HPI, reviewing relevant past history, doing exam, reviewing and discussing lab and imaging data, and formulating plans.  An After Visit Summary was printed and given to the patient.  FOLLOW UP: Return if symptoms worsen or fail to improve.  Signed:  Crissie Sickles, MD           08/01/2020

## 2020-08-09 ENCOUNTER — Ambulatory Visit: Payer: Medicare Other | Admitting: Primary Care

## 2020-08-09 ENCOUNTER — Ambulatory Visit: Payer: Medicare Other | Admitting: Adult Health

## 2020-08-13 ENCOUNTER — Ambulatory Visit: Payer: Medicare Other | Admitting: Urology

## 2020-08-16 ENCOUNTER — Ambulatory Visit: Payer: Medicare Other | Admitting: Urology

## 2020-08-17 ENCOUNTER — Other Ambulatory Visit: Payer: Self-pay

## 2020-08-17 ENCOUNTER — Ambulatory Visit (INDEPENDENT_AMBULATORY_CARE_PROVIDER_SITE_OTHER): Payer: Medicare Other | Admitting: Family Medicine

## 2020-08-17 ENCOUNTER — Encounter: Payer: Self-pay | Admitting: Family Medicine

## 2020-08-17 VITALS — BP 152/73 | HR 59 | Temp 97.4°F | Ht 60.0 in | Wt 126.0 lb

## 2020-08-17 DIAGNOSIS — I872 Venous insufficiency (chronic) (peripheral): Secondary | ICD-10-CM | POA: Diagnosis not present

## 2020-08-17 DIAGNOSIS — S80262A Insect bite (nonvenomous), left knee, initial encounter: Secondary | ICD-10-CM

## 2020-08-17 DIAGNOSIS — W57XXXA Bitten or stung by nonvenomous insect and other nonvenomous arthropods, initial encounter: Secondary | ICD-10-CM | POA: Diagnosis not present

## 2020-08-17 MED ORDER — DOXYCYCLINE HYCLATE 100 MG PO TABS: 100.0000 mg | ORAL_TABLET | Freq: Two times a day (BID) | ORAL | 0 refills | Status: DC

## 2020-08-17 MED ORDER — TRIAMCINOLONE ACETONIDE 0.1 % EX CREA: 1.0000 "application " | TOPICAL_CREAM | Freq: Two times a day (BID) | CUTANEOUS | 2 refills | Status: DC

## 2020-08-17 NOTE — Progress Notes (Signed)
This visit occurred during the SARS-CoV-2 public health emergency.  Safety protocols were in place, including screening questions prior to the visit, additional usage of staff PPE, and extensive cleaning of exam room while observing appropriate contact time as indicated for disinfecting solutions.    Priscilla Cantrell , February 28, 1935, 85 y.o., female MRN: [id number] Patient Care Team    Relationship Specialty Notifications Start End  [name], MD PCP - General   01/16/10    Comment: Arnetha Gula, MD Consulting Physician Cardiology  06/06/10   Monna Fam, MD Consulting Physician Ophthalmology  01/29/11   Chesley Mires, MD Consulting Physician Pulmonary Disease  12/21/12   Latanya Maudlin, MD Consulting Physician Orthopedic Surgery  09/26/14   Cleon Gustin, MD Consulting Physician Urology  03/10/16   Paralee Cancel, MD Consulting Physician Orthopedic Surgery  09/04/17   Linus Mako, MD Consulting Physician Family Medicine  03/03/18   Lauraine Rinne, NP Nurse Practitioner Pulmonary Disease  08/02/18   Alda Berthold, DO Consulting Physician Neurology  09/06/18   Phineas Real Belinda Block, MD (Inactive) Consulting Physician Gynecology  11/06/18   Pleasant, Eppie Gibson, [facility]  Admissions 12/03/18   Janne Napoleon, Hershal Coria (Inactive)  Dermatology  06/23/19   Starlyn Skeans Physician Assistant Dermatology  03/08/20   Rolene Course, PA-C Consulting Physician Gastroenterology  03/14/20    Comment: PA with [facility] in [place], [place] Physician Assistant Gastroenterology  05/09/20     Chief Complaint  Patient presents with   Rash    Pt c/o rash that itches on both legs x 2 weeks     Subjective: Pt presents for an OV with complaints of rash and itching of her bilateral lower extremity for 2 weeks.  This is a recurrent issue for her.  She has venous stasis dermatitis chronically.  She also has a  separate issue of itchiness behind her left knee that has been present for at least 10 days.  She is unable to see the rash she easily given location.  She reports her neighbor said it had something black on it and they brushed it off.  She reports it was some type of insect possibly.  She denies fever, arthralgias, headache or new rash in other location.  Depression screen [facility] 2/9 02/01/2020 01/12/2020 04/05/2019 01/19/2019 11/03/2018  Decreased Interest 0 0 0 0 0  Down, Depressed, Hopeless 1 0 1 0 0  PHQ - 2 Score 1 0 1 0 0  Altered sleeping - - - - -  Tired, decreased energy - - - - 0  Change in appetite - - - - 0  Feeling bad or failure about yourself  - - - - 0  Trouble concentrating - - - - -  Moving slowly or fidgety/restless - - - - 0  Suicidal thoughts - - - - -  PHQ-9 Score - - - - -  Difficult doing work/chores - - - - Not difficult at all  Some recent data might be hidden    Allergies  Allergen Reactions   Losartan Other (See Comments)    hyperkalemia   Penicillins Hives, Swelling and Rash    Has patient had a PCN reaction causing immediate rash, facial/tongue/throat swelling, SOB or lightheadedness with hypotension: YES Has patient had a PCN reaction causing severe rash involving mucus membranes or skin necrosis: NO Has patient had a PCN reaction that required hospitalization  NO Has patient had a PCN reaction occurring within the last 10 years: NO If all of the above answers are "NO", then may proceed with Cephalosporin use.    Cefixime [Kdc:Cefixime] Other (See Comments)    unspecified   Gabapentin Other (See Comments)    Feels woozy   Indocin [Indomethacin] Other (See Comments)    Painful tongue and throat   Singulair [Montelukast Sodium]     Chest tightness, decreased mental clarity   Sulfa Antibiotics Other (See Comments)    unspecified   Ace Inhibitors Other (See Comments)    cough   Statins Other (See Comments)    Myalgias    Social History   Social  History Narrative   Widowed since fall 2014   Lives in [place], [place].      Worked as Research officer, trade union, retired 2017.   She has support of her daughter and grandson. She is independent with all care needs and transportation at this time. 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    She reports she is on SSI and receives another separate check    She reports her work hx included working in Engineer, mining for [employer] and volunteered in a [employer]    She reports having a short hair dog for 2 years  but denies noting any allergic reactions   Right handed   Two story home   Past Medical History:  Diagnosis Date   Allergic rhinitis    with upper airway cough: as of 07/2017 pulm f/u she was instructed to use astelin, flonase, and saline more consistently   BPPV (benign paroxysmal positional vertigo) 03/2018   Chest pain, non-cardiac    Cardiac CT showed no signif obstructive dz, mildly elevated calcium level (Dr. [name]).   Chronic cystitis with hematuria    [name]   Chronic hypoxemic respiratory failure ([place]) 02/02/2015   2L oxygen 24/7   Cold hands    NCS/EMGs normal.  Hand dysesthesias per neuro--reassured   COPD (chronic obstructive pulmonary disease) ([place])    GOLD 4.  spirometry 01/10/09 FEV 0.97(52%), FEV1% 47.  With chronic bronchitis as of 07/2017.   DDD (degenerative disc disease), cervical    1998 MRI C5-6 impingemt (left).  MRI 2021 (neurol) 1 level canal stenosis, 1 level foraminal stenosis--->PT   DDD (degenerative disc disease), lumbar    Depression with anxiety 04/15/2011   Diarrheal disease Summer 2017   ? 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).  As of 10/05/15 pt reports GI dx'd her with C diff and rx'd more flagyl and she is also on cholestyramine.  As of 02/2016,  pt's sx's resolved completely.   Diverticulosis of colon    Procto '97 and colonoscopy 2002   Dysplastic nevus of upper extremity 04/2014   R tricep ([name])   Edema of both lower extremities due to peripheral venous insufficiency    Varicosities bilat, swelling L>R.  R baker's cyst.  Got vein clinic eval 02/2018->sclerotherapy recommended but since no hemorrhage or ulceration, insurance will not cover the procedure.   Family history of adverse reaction to anesthesia    mother died when patient age 5 receiving anesthesia for thyroid surgery   Fibrocystic breast disease    w/fibroadenoma.  Bx's showed NO atypia (Dr. Margot Chimes).  Mammo neg 2008.   GERD (  gastroesophageal reflux disease)    History of double vision    Ophthalmologist, Dr. Herbert Deaner, is further evaluating this with MRI  orbits and limited brain (myesthenia gravis testing neg 07/2010)   History of home oxygen therapy    uses 2 liters at hs   Hypertension    EKG 03/2010 normal   Hypothyroidism    Hashimoto's, dx'd 1989   Idiopathic peripheral neuropathy 04/2018   Sensory (numb feet) S1 distribution bilat, c/w spinal stenosis sensory neuropathy (no pain). 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  Labs [place], MRI C spine->some spondylosis/stenosis.  UE NCS/EMS: normal 04/2019.   Lesion of right native kidney 2017   found on CT 02/2015.  F/u MRI 03/31/15 showed it to be smaller and less likely of any significance but repeat CT renal protocol in 23mo was recommended by radiology.  This was done 10/26/15 and showed no interval change in the lesion, but b/c the lesion enhances with contrast, radiol rec'd urol consult (b/c pap renal RCC could not be excluded).  Saw urol 03/06/16--stable on u/s 09/2016 and 09/2017.   Osteoarthritis, hip, bilateral    she is s/p bilat THA as of 02/2018.   Osteoporosis    radius, T score -2.5->rec'd alendronate 08/2019->plan rpt DEXA 08/2021.   Pneumonia     Postmenopausal atrophic vaginitis    improved with estrace 2 X/week.   Prolapse of vaginal cuff after hysterectomy    10/2018: Dr. Wayne Both. Mild colpocele 04/2019 per GYN (no surgery)   Pulmonary nodule    CT chest 12/10, June 2011.  No nodule on CT 06/2010.   Recurrent Clostridium difficile diarrhea    Episode 09/19/2016: resolved with prolonged course of flagyl.  11/2016 recurrence treated with 10d of Dificid (Fidaxomicin) by GI (Dr. Earlean Shawl).   Recurrent UTI    likely related to pt's atrophic vaginitis.  Urol started vaginal estrace 03/2016.   Rib fracture 09/2018   R 7th, laterally-->sustained when her dog pulled her over and she fell.   RLS (restless legs syndrome)    neuro->gabapentin trial 2020/21   Shingles 02/2014   R abd/flank   Spinal stenosis of lumbar region with neurogenic claudication    2008 MRI (L3-4, L4-5, L5-S1).  +S1 distribution sensory loss bilat   Past Surgical History:  Procedure Laterality Date   ABDOMINAL HYSTERECTOMY  1978   no BSO per pt--nonmalignant reasons   APPENDECTOMY     BREAST CYST EXCISION     Last screening mammogram 10/2010 was normal.   CHOLECYSTECTOMY  2001   COLONOSCOPY  04/2005; 12/06/2015   2007 NORMAL.  2017 adenomatous polyp x 1.  Mod sigmoid diverticulosis (Dr. Earlean Shawl).     DEXA  05/18/2017; 08/7019   2019 NORMAL (T-score 0.1).  08/2019->T score -2.5->alendronate started->plan Rpt 08/2021.   HIP CLOSED REDUCTION Right 12/25/2015   Procedure: CLOSED REDUCTION HIP;  Surgeon: Nicholes Stairs, MD;  Location: [facility];  Service: Orthopedics;  Laterality: Right;   NCV/EMS  10/2018   chronic and symmetric sensorimotor axonal polyneuropathy affecting the lower extremities   TONSILLECTOMY     TOTAL HIP ARTHROPLASTY Right 11/01/2014   Procedure: RIGHT TOTAL HIP ARTHROPLASTY;  Surgeon: Latanya Maudlin, MD;  Location: [facility];  Service: Orthopedics;  Laterality: Right;   TOTAL HIP ARTHROPLASTY Left 12/08/2017   Procedure: LEFT TOTAL HIP ARTHROPLASTY  ANTERIOR APPROACH;  Surgeon: Paralee Cancel, MD;  Location: [facility];  Service: Orthopedics;  Laterality: Left;  70 mins   TRANSTHORACIC ECHOCARDIOGRAM  08/2014   EF 60-65%, grade I diast dysfxn   TUBAL LIGATION  1974   Family History  Problem Relation Age of Onset   Goiter Mother    Psoriasis Father        od liver   Cirrhosis Father    Diabetes Daughter        Type 2   Stroke Maternal Grandfather    Stroke Paternal Grandmother    Hypertension Paternal Grandmother    Hernia Paternal Grandmother    Cancer Paternal Grandfather        lung   Cancer Maternal Aunt        cancer of the stomach   Allergies as of 08/17/2020       Reactions   Losartan Other (See Comments)   hyperkalemia   Penicillins Hives, Swelling, Rash   Has patient had a PCN reaction causing immediate rash, facial/tongue/throat swelling, SOB or lightheadedness with hypotension: YES Has patient had a PCN reaction causing severe rash involving mucus membranes or skin necrosis: NO Has patient had a PCN reaction that required hospitalization NO Has patient had a PCN reaction occurring within the last 10 years: NO If all of the above answers are "NO", then may proceed with Cephalosporin use.   Cefixime [kdc:cefixime] Other (See Comments)   unspecified   Gabapentin Other (See Comments)   Feels woozy   Indocin [indomethacin] Other (See Comments)   Painful tongue and throat   Singulair [montelukast Sodium]    Chest tightness, decreased mental clarity   Sulfa Antibiotics Other (See Comments)   unspecified   Ace Inhibitors Other (See Comments)   cough   Statins Other (See Comments)   Myalgias        Medication List        Accurate as of August 17, 2020 11:12 AM. If you have any questions, ask your nurse or doctor.          STOP taking these medications    doxycycline 100 MG capsule Commonly known as: VIBRAMYCIN Replaced by: doxycycline 100 MG tablet Stopped by: Howard Pouch, DO       TAKE these  medications    albuterol 108 (90 Base) MCG/ACT inhaler Commonly known as: ProAir HFA Inhale 2 puffs into the lungs every 4 (four) hours as needed for wheezing or shortness of breath.   alendronate 70 MG tablet Commonly known as: FOSAMAX Take 1 tablet (70 mg total) by mouth once a week. Take with a full glass of water on an empty stomach.   amLODipine 5 MG tablet Commonly known as: NORVASC TAKE 1 TABLET(5 MG) BY MOUTH DAILY   aspirin EC 81 MG tablet Take 81 mg by mouth daily.   azelastine 0.1 % nasal spray Commonly known as: ASTELIN Place 2 sprays into both nostrils 2 (two) times daily. Use in each nostril as directed   cetirizine 10 MG tablet Commonly known as: ZYRTEC Take 1 tablet (10 mg total) by mouth daily.   clobetasol 0.05 % external solution Commonly known as: TEMOVATE APPLY TO THE AFFECTED AREA EVERY DAY   diazepam 5 MG tablet Commonly known as: VALIUM TAKE 1 TABLET BY MOUTH DAILY AS NEEDED FOR ANXIETY AND INSOMNIA   doxycycline 100 MG tablet Commonly known as: VIBRA-TABS Take 1 tablet (100 mg total) by mouth 2 (two) times daily. Replaces: doxycycline 100 MG capsule Started by: Howard Pouch, DO   fluorometholone 0.1 % ophthalmic suspension Commonly known as: FML Place 1 drop into both eyes as directed. Tapering  off   fluticasone 50 MCG/ACT nasal spray Commonly known as: FLONASE Place 2 sprays into both nostrils daily.   furosemide 20 MG tablet Commonly known as: LASIX TAKE 1 TABLET BY MOUTH EVERY DAY AS NEEDED FOR INCREASED FLUID COLLECTION IN LEGS   mirabegron ER 25 MG Tb24 tablet Commonly known as: MYRBETRIQ Take 1 tablet (25 mg total) by mouth daily.   nitrofurantoin 50 MG capsule Commonly known as: MACRODANTIN Take 1 capsule (50 mg total) by mouth at bedtime.   PROBIOTIC DAILY PO Take 1 capsule by mouth daily.   roflumilast 500 MCG Tabs tablet Commonly known as: DALIRESP Take 1 tablet (500 mcg total) by mouth daily.   Soothe XP Soln Place  1 drop into both eyes 2 (two) times daily as needed (irritation).   Synthroid 88 MCG tablet Generic drug: levothyroxine TAKE 1 TABLET(88 MCG) BY MOUTH DAILY   Trelegy Ellipta 100-62.5-25 MCG/INH Aepb Generic drug: Fluticasone-Umeclidin-Vilant Inhale 1 puff into the lungs daily.   triamcinolone cream 0.1 % Commonly known as: KENALOG Apply 1 application topically 2 (two) times daily.   venlafaxine 50 MG tablet Commonly known as: EFFEXOR TAKE 1 TABLET BY MOUTH TWICE DAILY   Visine 0.05 % ophthalmic solution Generic drug: tetrahydrozoline 1 drop  as needed   vitamin B-12 100 MCG tablet Commonly known as: CYANOCOBALAMIN Take 100 mcg by mouth daily.   VITAMIN D PO Take by mouth.        All past medical history, surgical history, allergies, family history, immunizations andmedications were updated in the EMR today and reviewed under the history and medication portions of their EMR.     ROS: Negative, with the exception of above mentioned in HPI   Objective:  BP (!) 152/73   Pulse (!) 59   Temp (!) 97.4 F (36.3 C) (Oral)   Ht 5' (1.524 m)   Wt 126 lb (57.2 kg)   SpO2 98%   BMI 24.61 kg/m  Body mass index is 24.61 kg/m. Gen: Afebrile. No acute distress. Nontoxic in appearance, well developed, well nourished.  HENT: AT. [place].  Eyes:Pupils Equal Round Reactive to light, Extraocular movements intact,  Conjunctiva without redness, discharge or icterus. Skin: Venous stasis hyperpigmentation present bilateral lower extremities with dry scaly skin.  No obvious other rash at this location.  Posterior left knee has an area that appears to be an insect bite with a very dark center.  Surrounding this area is erythema for approximately 2 cm with some mild swelling. no purpura or petechiae.  Neuro: Normal gait. PERLA. EOMi. Alert. Oriented x3  Psych: Normal affect, dress and demeanor. Normal speech. Normal thought content and judgment.  No results found. No results found. No  results found. However, due to the size of the patient record, not all encounters were searched. Please check Results Review for a complete set of results.  Assessment/Plan: WRENNA SAKS is a 85 y.o. female present for OV for  Insect bite, initial encounter And visualized under magnification it appears to be remnants of an insect remaining.  Area was cleaned with alcohol swab and 18-gauge needle used to easily remove foreign body. No bleeding noted.  Bacitracin ointment and 2 x 2/paper tape dressing applied. Doxycycline twice daily x7 days prescribed-possible mild cellulitis and tick exposure. Patient was encouraged to apply Benadryl gel to help with itching.  Venous stasis dermatitis of both lower extremities Kenalog cream prescribed.  Patient is not fluid overloaded today. Encouraged her to keep her feet elevated when at  all possible.  Follow-up with PCP 1 to 2 weeks if symptoms are not continuing to improve.  Reviewed expectations re: course of current medical issues. Discussed self-management of symptoms. Outlined signs and symptoms indicating need for more acute intervention. Patient verbalized understanding and all questions were answered. Patient received an After-Visit Summary.    No orders of the defined types were placed in this encounter.  Meds ordered this encounter  Medications   doxycycline (VIBRA-TABS) 100 MG tablet    Sig: Take 1 tablet (100 mg total) by mouth 2 (two) times daily.    Dispense:  14 tablet    Refill:  0   triamcinolone cream (KENALOG) 0.1 %    Sig: Apply 1 application topically 2 (two) times daily.    Dispense:  45 g    Refill:  2   Referral Orders  No referral(s) requested today     Note is dictated utilizing voice recognition software. Although note has been proof read prior to signing, occasional typographical errors still can be missed. If any questions arise, please do not hesitate to call for verification.   electronically signed  by:  Howard Pouch, DO  [place]

## 2020-08-17 NOTE — Patient Instructions (Signed)
Kenalog cream for your lower legs/itchiness. Doxycyline every 12 hours for 7 days for redness behind knee Pick up benadryl gel for itching behind knee

## 2020-08-20 ENCOUNTER — Ambulatory Visit: Payer: Medicare Other | Admitting: Family Medicine

## 2020-08-22 ENCOUNTER — Other Ambulatory Visit: Payer: Self-pay | Admitting: *Deleted

## 2020-08-22 NOTE — Patient Outreach (Signed)
[facility]) Care Management  08/22/2020  Priscilla Cantrell 1935/09/08 [id number]   RN Health Coach attempted follow up outreach call to patient.  Patient was unavailable. No voicemail message pick up.  Plan: RN will call patient again within 30 days.  [facility] [phone]

## 2020-08-29 ENCOUNTER — Other Ambulatory Visit: Payer: Self-pay | Admitting: Pulmonary Disease

## 2020-08-29 DIAGNOSIS — J9611 Chronic respiratory failure with hypoxia: Secondary | ICD-10-CM

## 2020-08-29 DIAGNOSIS — J449 Chronic obstructive pulmonary disease, unspecified: Secondary | ICD-10-CM

## 2020-09-03 DIAGNOSIS — Z1231 Encounter for screening mammogram for malignant neoplasm of breast: Secondary | ICD-10-CM | POA: Diagnosis not present

## 2020-09-03 LAB — HM MAMMOGRAPHY

## 2020-09-07 ENCOUNTER — Other Ambulatory Visit: Payer: Self-pay

## 2020-09-07 ENCOUNTER — Ambulatory Visit (INDEPENDENT_AMBULATORY_CARE_PROVIDER_SITE_OTHER): Payer: Medicare Other | Admitting: Family Medicine

## 2020-09-07 ENCOUNTER — Encounter: Payer: Self-pay | Admitting: Family Medicine

## 2020-09-07 VITALS — BP 153/69 | HR 63 | Temp 97.5°F | Resp 16 | Ht 60.0 in | Wt 127.4 lb

## 2020-09-07 DIAGNOSIS — I10 Essential (primary) hypertension: Secondary | ICD-10-CM | POA: Diagnosis not present

## 2020-09-07 DIAGNOSIS — I872 Venous insufficiency (chronic) (peripheral): Secondary | ICD-10-CM

## 2020-09-07 MED ORDER — SYNTHROID 88 MCG PO TABS: ORAL_TABLET | ORAL | 3 refills | Status: DC

## 2020-09-07 MED ORDER — AMLODIPINE BESYLATE 5 MG PO TABS: ORAL_TABLET | ORAL | 3 refills | Status: DC

## 2020-09-07 NOTE — Addendum Note (Signed)
Addended by: Octaviano Glow on: 09/07/2020 02:44 PM   Modules accepted: Orders

## 2020-09-07 NOTE — Progress Notes (Signed)
OFFICE VISIT  09/07/2020  CC:  Chief Complaint  Patient presents with   Follow-up    RCI,    HPI:   Patient is a 85 y.o. Caucasian female who presents for f/u HTN, GAD, and chronic venous insufficiency edema (+hx of stasis dermatitis). Reports doing fine.  HTN: amlodipine '5mg'$  qd. No home bp monitoring.  Anxiety/mood: venlafaxine '50mg'$  bid long term and diazepam '5mg'$  qd prn long term. Takes the diaz '5mg'$  every night b/c helps her rest despite her legs neuropathy sx's.  LEGS: treated for stasis dermatitis with kenalog cream by Dr. Raoul Pitch on 7/15, also doxy x 7d for possible tick bite. Her dermatitis is resolved.  Legs w/out edema lately. She continues to have some little red spots on legs.   Past Medical History:  Diagnosis Date   Allergic rhinitis    with upper airway cough: as of 07/2017 pulm f/u she was instructed to use astelin, flonase, and saline more consistently   BPPV (benign paroxysmal positional vertigo) 03/2018   Chest pain, non-cardiac    Cardiac CT showed no signif obstructive dz, mildly elevated calcium level (Dr. [name]).   Chronic cystitis with hematuria    [name]   Chronic hypoxemic respiratory failure ([place]) 02/02/2015   2L oxygen 24/7   Cold hands    NCS/EMGs normal.  Hand dysesthesias per neuro--reassured   COPD (chronic obstructive pulmonary disease) ([place])    GOLD 4.  spirometry 01/10/09 FEV 0.97(52%), FEV1% 47.  With chronic bronchitis as of 07/2017.   DDD (degenerative disc disease), cervical    1998 MRI C5-6 impingemt (left).  MRI 2021 (neurol) 1 level canal stenosis, 1 level foraminal stenosis--->PT   DDD (degenerative disc disease), lumbar    Depression with anxiety 04/15/2011   Diarrheal disease Summer 2017   ? 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).  As of 10/05/15 pt reports GI dx'd her with C diff and rx'd more flagyl and she is also  on cholestyramine.  As of 02/2016, pt's sx's resolved completely.   Diverticulosis of colon    Procto '97 and colonoscopy 2002   Dysplastic nevus of upper extremity 04/2014   R tricep ([name])   Edema of both lower extremities due to peripheral venous insufficiency    Varicosities bilat, swelling L>R.  R baker's cyst.  Got vein clinic eval 02/2018->sclerotherapy recommended but since no hemorrhage or ulceration, insurance will not cover the procedure.   Family history of adverse reaction to anesthesia    mother died when patient age 42 receiving anesthesia for thyroid surgery   Fibrocystic breast disease    w/fibroadenoma.  Bx's showed NO atypia (Dr. Margot Chimes).  Mammo neg 2008.   GERD (gastroesophageal reflux disease)    History of double vision    Ophthalmologist, Dr. Herbert Deaner, is further evaluating this with MRI  orbits and limited brain (myesthenia gravis testing neg 07/2010)   History of home oxygen therapy    uses 2 liters at hs   Hypertension    EKG 03/2010 normal   Hypothyroidism    Hashimoto's, dx'd 1989   Idiopathic peripheral neuropathy 04/2018   Sensory (numb feet) S1 distribution bilat, c/w spinal stenosis sensory neuropathy (no pain). 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  Labs [place], MRI C spine->some spondylosis/stenosis.  UE NCS/EMS: normal 04/2019.   Lesion of right native kidney 2017   found on CT 02/2015.  F/u  MRI 03/31/15 showed it to be smaller and less likely of any significance but repeat CT renal protocol in [place]mow[place] recommended by radiology.  This was done 10/26/15 and showed no interval change in the lesion, but b/c the lesion enhances with contrast, radiol rec'd urol consult (b/c pap renal RCC could not be excluded).  Saw urol 03/06/16--stable on u/s 09/2016 and 09/2017.   Osteoarthritis, hip, bilateral    she is s/p bilat THA as of 02/2018.   Osteoporosis    radius, T score -2.5->rec'd alendronate 08/2019->plan  rpt DEXA 08/2021.   Pneumonia    Postmenopausal atrophic vaginitis    improved with estrace 2 X/week.   Prolapse of vaginal cuff after hysterectomy    10/2018: Dr. FWayne Both Mild colpocele 04/2019 per GYN (no surgery)   Pulmonary nodule    CT chest 12/10, June 2011.  No nodule on CT 06/2010.   Recurrent Clostridium difficile diarrhea    Episode 09/19/2016: resolved with prolonged course of flagyl.  11/2016 recurrence treated with 10d of Dificid (Fidaxomicin) by GI (Dr. MEarlean Shawl.   Recurrent UTI    likely related to pt's atrophic vaginitis.  Urol started vaginal estrace 03/2016.   Rib fracture 09/2018   R 7th, laterally-->sustained when her dog pulled her over and she fell.   RLS (restless legs syndrome)    neuro->gabapentin trial 2020/21   Shingles 02/2014   R abd/flank   Spinal stenosis of lumbar region with neurogenic claudication    2008 MRI (L3-4, L4-5, L5-S1).  +S1 distribution sensory loss bilat    Past Surgical History:  Procedure Laterality Date   ABDOMINAL HYSTERECTOMY  1978   no BSO per pt--nonmalignant reasons   APPENDECTOMY     BREAST CYST EXCISION     Last screening mammogram 10/2010 was normal.   CHOLECYSTECTOMY  2001   COLONOSCOPY  04/2005; 12/06/2015   2007 NORMAL.  2017 adenomatous polyp x 1.  Mod sigmoid diverticulosis (Dr. MEarlean Shawl.     DEXA  05/18/2017; 08/7019   2019 NORMAL (T-score 0.1).  08/2019->T score -2.5->alendronate started->plan Rpt 08/2021.   HIP CLOSED REDUCTION Right 12/25/2015   Procedure: CLOSED REDUCTION HIP;  Surgeon: JNicholes Stairs MD;  Location: [facility];  Service: Orthopedics;  Laterality: Right;   NCV/EMS  10/2018   chronic and symmetric sensorimotor axonal polyneuropathy affecting the lower extremities   TONSILLECTOMY     TOTAL HIP ARTHROPLASTY Right 11/01/2014   Procedure: RIGHT TOTAL HIP ARTHROPLASTY;  Surgeon: RLatanya Maudlin MD;  Location: [facility];  Service: Orthopedics;  Laterality: Right;   TOTAL HIP ARTHROPLASTY Left 12/08/2017   Procedure:  LEFT TOTAL HIP ARTHROPLASTY ANTERIOR APPROACH;  Surgeon: OParalee Cancel MD;  Location: [facility];  Service: Orthopedics;  Laterality: Left;  70 mins   TRANSTHORACIC ECHOCARDIOGRAM  08/2014   EF 60-65%, grade I diast dysfxn   TUBAL LIGATION  1974    Outpatient Medications Prior to Visit  Medication Sig Dispense Refill   albuterol (PROAIR HFA) 108 (90 Base) MCG/ACT inhaler Inhale 2 puffs into the lungs every 4 (four) hours as needed for wheezing or shortness of breath. 8.5 g 1   alendronate (FOSAMAX) 70 MG tablet Take 1 tablet (70 mg total) by mouth once a week. Take with a full glass of water on an empty stomach. 12 tablet 3   Artificial Tear Solution (SOOTHE XP) SOLN Place 1 drop into both eyes 2 (two) times daily as needed (irritation).     aspirin EC 81 MG tablet Take 81 mg  by mouth daily.     azelastine (ASTELIN) 0.1 % nasal spray Place 2 sprays into both nostrils 2 (two) times daily. Use in each nostril as directed 30 mL 3   cetirizine (ZYRTEC) 10 MG tablet Take 1 tablet (10 mg total) by mouth daily. 30 tablet 6   clobetasol (TEMOVATE) 0.05 % external solution APPLY TO THE AFFECTED AREA EVERY DAY 50 mL 4   diazepam (VALIUM) 5 MG tablet TAKE 1 TABLET BY MOUTH DAILY AS NEEDED FOR ANXIETY AND INSOMNIA 30 tablet 5   fluorometholone (FML) 0.1 % ophthalmic suspension Place 1 drop into both eyes as directed. Tapering off     fluticasone (FLONASE) 50 MCG/ACT nasal spray Place 2 sprays into both nostrils daily. 16 g 6   furosemide (LASIX) 20 MG tablet TAKE 1 TABLET BY MOUTH EVERY DAY AS NEEDED FOR INCREASED FLUID COLLECTION IN LEGS 30 tablet 3   mirabegron ER (MYRBETRIQ) 25 MG TB24 tablet Take 1 tablet (25 mg total) by mouth daily. 30 tablet 3   Probiotic Product (PROBIOTIC DAILY PO) Take 1 capsule by mouth daily.      roflumilast (DALIRESP) 500 MCG TABS tablet Take 1 tablet (500 mcg total) by mouth daily. 90 tablet 3   TRELEGY ELLIPTA 100-62.5-25 MCG/INH AEPB INHALE 1 PUFF INTO THE LUNGS DAILY 60 each  6   triamcinolone cream (KENALOG) 0.1 % Apply 1 application topically 2 (two) times daily. 45 g 2   venlafaxine (EFFEXOR) 50 MG tablet TAKE 1 TABLET BY MOUTH TWICE DAILY 180 tablet 1   VISINE 0.05 % ophthalmic solution 1 drop  as needed     vitamin B-12 (CYANOCOBALAMIN) 100 MCG tablet Take 100 mcg by mouth daily.     VITAMIN D PO Take by mouth.     nitrofurantoin (MACRODANTIN) 50 MG capsule Take 1 capsule (50 mg total) by mouth at bedtime. (Patient not taking: Reported on 09/07/2020) 90 capsule 3   amLODipine (NORVASC) 5 MG tablet TAKE 1 TABLET(5 MG) BY MOUTH DAILY 90 tablet 0   doxycycline (VIBRA-TABS) 100 MG tablet Take 1 tablet (100 mg total) by mouth 2 (two) times daily. (Patient not taking: Reported on 09/07/2020) 14 tablet 0   SYNTHROID 88 MCG tablet TAKE 1 TABLET(88 MCG) BY MOUTH DAILY 30 tablet 5   No facility-administered medications prior to visit.    Allergies  Allergen Reactions   Losartan Other (See Comments)    hyperkalemia   Penicillins Hives, Swelling and Rash    Has patient had a PCN reaction causing immediate rash, facial/tongue/throat swelling, SOB or lightheadedness with hypotension: YES Has patient had a PCN reaction causing severe rash involving mucus membranes or skin necrosis: NO Has patient had a PCN reaction that required hospitalization NO Has patient had a PCN reaction occurring within the last 10 years: NO If all of the above answers are "NO", then may proceed with Cephalosporin use.    Cefixime [Kdc:Cefixime] Other (See Comments)    unspecified   Gabapentin Other (See Comments)    Feels woozy   Indocin [Indomethacin] Other (See Comments)    Painful tongue and throat   Singulair [Montelukast Sodium]     Chest tightness, decreased mental clarity   Sulfa Antibiotics Other (See Comments)    unspecified   Ace Inhibitors Other (See Comments)    cough   Statins Other (See Comments)    Myalgias     ROS As per HPI  PE: Vitals with BMI 09/07/2020 08/17/2020  08/01/2020  Height '5\' 0"'$   $'5\' 0"'A$  '5\' 0"'$   Weight 127 lbs 6 oz 126 lbs 126 lbs 3 oz  BMI 24.88 [id number]  Systolic 0000000 0000000 [id number]  Diastolic 69 73 64  Pulse 63 59 69  Some encounter information is confidential and restricted. Go to Review Flowsheets activity to see all data.  Manual bp at end of visit today was 150/80  Gen: Alert, well appearing.  Patient is oriented to person, place, time, and situation. AFFECT: pleasant, lucid thought and speech. CV: RRR, no m/r/g.   LUNGS: CTA bilat, nonlabored resps, good aeration in all lung fields. EXT: no clubbing or cyanosis.  No edema.  Diffuse non--inflamed varicosities bilat, diffuse freckling skin changes in LLs.  No erythema. 1-2 small pinkish macules on each LL.  LABS:  Lab Results  Component Value Date   TSH 1.12 07/10/2020   Lab Results  Component Value Date   WBC 6.2 05/09/2020   HGB 14.5 05/09/2020   HCT 44.0 05/09/2020   MCV 89.8 05/09/2020   PLT 255.0 05/09/2020   Lab Results  Component Value Date   CREATININE 0.91 05/09/2020   BUN 20 05/09/2020   NA 141 05/09/2020   K 3.9 05/09/2020   CL 105 05/09/2020   CO2 29 05/09/2020   Lab Results  Component Value Date   ALT 13 05/09/2020   AST 16 05/09/2020   ALKPHOS 105 05/09/2020   BILITOT 0.5 05/09/2020   Lab Results  Component Value Date   CHOL 211 (H) 05/26/2018   Lab Results  Component Value Date   HDL 68.20 05/26/2018   Lab Results  Component Value Date   LDLCALC 127 (H) 05/26/2018   Lab Results  Component Value Date   TRIG 79.0 05/26/2018   Lab Results  Component Value Date   CHOLHDL 3 05/26/2018   Lab Results  Component Value Date   HGBA1C 5.7 10/29/2011   IMPRESSION AND PLAN:  1) HTN: elevated bp today. She's been sensitive to bp med alterations in the past so I won't change anything today.  I asked her to check her bp 3 days out of the next week and call with report. BMET today.  2) Varicose vein dz, bilat LE venous insufficiency with  intermittent edema: doing well now. Has lasix for prn use but has not needed lately. Low Na diet.  Elevation prn. Recent stasis dermatitis has resolved.  3) Anxiety/insomnia: taking '5mg'$  diazepam qhs and helping well.  An After Visit Summary was printed and given to the patient.  FOLLOW UP: Return in about 3 months (around 12/08/2020) for routine chronic illness f/u.  Signed:  Crissie Sickles, MD           09/07/2020

## 2020-09-08 LAB — BASIC METABOLIC PANEL
BUN/Creatinine Ratio: 17 (calc) (ref 6–22)
BUN: 18 mg/dL (ref 7–25)
CO2: 23 mmol/L (ref 20–32)
Calcium: 8.9 mg/dL (ref 8.6–10.4)
Chloride: 107 mmol/L (ref 98–110)
Creat: 1.04 mg/dL — ABNORMAL HIGH (ref 0.60–0.95)
Glucose, Bld: 73 mg/dL (ref 65–99)
Potassium: 4 mmol/L (ref 3.5–5.3)
Sodium: 140 mmol/L (ref 135–146)

## 2020-09-09 ENCOUNTER — Other Ambulatory Visit: Payer: Self-pay | Admitting: Family Medicine

## 2020-09-10 ENCOUNTER — Ambulatory Visit: Payer: Medicare Other | Admitting: Urology

## 2020-09-10 ENCOUNTER — Encounter: Payer: Self-pay | Admitting: Family Medicine

## 2020-09-11 ENCOUNTER — Telehealth: Payer: Self-pay | Admitting: Family Medicine

## 2020-09-11 NOTE — Telephone Encounter (Signed)
These blood pressures are fine. No changes recommended.  Reassure.-thx

## 2020-09-11 NOTE — Telephone Encounter (Signed)
Left detailed message in regards to pt BP readings

## 2020-09-11 NOTE — Telephone Encounter (Signed)
Patient called to report the following readings but states she had the cuff on wrong 8/6 and 8/7: 09/08/20 115/64 HR 63 (cuff on wrong)  09/09/20 116/68 HR 68 (cuff on wrong) 09/10/20 142/89 HR 75 09/11/20 134/68 HR 72

## 2020-09-11 NOTE — Telephone Encounter (Signed)
Pt was advised to call with BP readings after recent o/v.   Please advise, thanks.

## 2020-09-20 ENCOUNTER — Other Ambulatory Visit: Payer: Self-pay | Admitting: *Deleted

## 2020-09-20 NOTE — Patient Instructions (Signed)
Goals Addressed             This Visit's Progress    (THN)Make and Keep All Appointments   On track    Timeframe:  Long-Range Goal Priority:  Medium Start Date:   [id number]                          Expected End Date:  [id number]                    Follow Up Date [id number]   - call to cancel if needed - keep a calendar with prescription refill dates - keep a calendar with appointment dates    Why is this important?   Part of staying healthy is seeing the doctor for follow-up care.  If you forget your appointments, there are some things you can do to stay on track.    Notes:  [id number] Patient is keeping all appointments     [facility] Fatigue (Tiredness)   On track    Timeframe:  Long-Range Goal Priority:  Medium Start Date: [id number]                            Expected End Date: [id number]                 Follow Up Date [id number]   - eat healthy - get outdoors every day (weather permitting) - use devices that will help like a cane, sock-puller or reacher    Why is this important?   Feeling tired or worn out is a common symptom of COPD (chronic obstructive pulmonary disease).  Learning when you feel your best and when you need rest is important.  Managing the tiredness (fatigue) will help you be active and enjoy life.     Notes:  [id number] Patient has been doing yard work. She takes rest periods      [facility] My Emotions   On track    Timeframe:  Long-Range Goal Priority:  Medium Start Date:    [id number]                         Expected End Date:      [id number]                Follow Up [id number]   - talk about feelings with a friend, family or spiritual advisor    Why is this important?   When you are stressed, down or upset, your body reacts too.  For example, your blood pressure may get higher; you may have a headache or stomachache.  When your emotions get the best of you, your body's ability to fight off cold and flu gets weak.  These steps will help you manage  your emotions.     Notes:  Patient daughter will be staying with her during recovery time.  Patient daughter has left/ Patient is feeling some loneliness [id number] Patient takes with friend and minister  [id number] Patient has spiritual support from minister and friends     (THN)Track and Manage My Symptoms   On track    Timeframe:  Long-Range Goal Priority:  Medium Start Date:   [id number]                          Expected End Date:    [id number] Follow Up Date [id number] -  develop a rescue plan - eliminate symptom triggers at home - follow rescue plan if symptoms flare-up - keep follow-up appointments    Why is this important?   Tracking your symptoms and other information about your health helps your doctor plan your care.  Write down the symptoms, the time of day, what you were doing and what medicine you are taking.  You will soon learn how to manage your symptoms.     Notes:  [id number] patient is monitoring symptoms. No COPD exacerbation [id number] no admissions for COPD since last outreach     (THN)Track and Manage My Triggers   On track    Timeframe:  Long-Range Goal Priority:  Medium Start Date: [id number]                            Expected End Date:   [id number]                   Follow Up Date [id number] - identify and remove indoor air pollutants - limit outdoor activity during cold weather - listen for public air quality announcements every day    Why is this important?   Triggers are activities or things, like tobacco smoke or cold weather, that make your COPD (chronic obstructive pulmonary disease) flare-up.  Knowing these triggers helps you plan how to stay away from them.  When you cannot remove them, you can learn how to manage them.     Notes:  [id number] Patient is monitoring her symptoms

## 2020-09-20 NOTE — Patient Outreach (Signed)
[place] [facility]) Care Management  [place]  09/20/2020   Priscilla Cantrell August 12, 1935 [id number]  Craig received return telephone call from patient.  [employer] compliance verified. Per patient the heat bothers her. She monitors the outdoor conditions.Patient stated that she is doing better. Patient stated that she has been taking medications as per ordered. Patient is going out for socialization with church group. She is working a part time job on Mondays and Tuesdays. Patient has agreed to follow up outreach calls.   Encounter Medications:  Outpatient Encounter Medications as of 09/20/2020  Medication Sig   albuterol (PROAIR HFA) 108 (90 Base) MCG/ACT inhaler Inhale 2 puffs into the lungs every 4 (four) hours as needed for wheezing or shortness of breath.   alendronate (FOSAMAX) 70 MG tablet TAKE 1 TABLET(70 MG) BY MOUTH 1 TIME A WEEK WITH A FULL GLASS OF WATER AND ON AN EMPTY STOMACH   amLODipine (NORVASC) 5 MG tablet TAKE 1 TABLET(5 MG) BY MOUTH DAILY   Artificial Tear Solution (SOOTHE XP) SOLN Place 1 drop into both eyes 2 (two) times daily as needed (irritation).   aspirin EC 81 MG tablet Take 81 mg by mouth daily.   azelastine (ASTELIN) 0.1 % nasal spray Place 2 sprays into both nostrils 2 (two) times daily. Use in each nostril as directed   cetirizine (ZYRTEC) 10 MG tablet Take 1 tablet (10 mg total) by mouth daily.   clobetasol (TEMOVATE) 0.05 % external solution APPLY TO THE AFFECTED AREA EVERY DAY   diazepam (VALIUM) 5 MG tablet TAKE 1 TABLET BY MOUTH DAILY AS NEEDED FOR ANXIETY AND INSOMNIA   fluorometholone (FML) 0.1 % ophthalmic suspension Place 1 drop into both eyes as directed. Tapering off   fluticasone (FLONASE) 50 MCG/ACT nasal spray Place 2 sprays into both nostrils daily.   furosemide (LASIX) 20 MG tablet TAKE 1 TABLET BY MOUTH EVERY DAY AS NEEDED FOR INCREASED FLUID COLLECTION IN LEGS   mirabegron ER (MYRBETRIQ) 25 MG TB24 tablet Take 1 tablet  (25 mg total) by mouth daily.   nitrofurantoin (MACRODANTIN) 50 MG capsule Take 1 capsule (50 mg total) by mouth at bedtime. (Patient not taking: Reported on 09/07/2020)   Probiotic Product (PROBIOTIC DAILY PO) Take 1 capsule by mouth daily.    roflumilast (DALIRESP) 500 MCG TABS tablet Take 1 tablet (500 mcg total) by mouth daily.   SYNTHROID 88 MCG tablet TAKE 1 TABLET(88 MCG) BY MOUTH DAILY   TRELEGY ELLIPTA 100-62.5-25 MCG/INH AEPB INHALE 1 PUFF INTO THE LUNGS DAILY   triamcinolone cream (KENALOG) 0.1 % Apply 1 application topically 2 (two) times daily.   venlafaxine (EFFEXOR) 50 MG tablet TAKE 1 TABLET BY MOUTH TWICE DAILY   VISINE 0.05 % ophthalmic solution 1 drop  as needed   vitamin B-12 (CYANOCOBALAMIN) 100 MCG tablet Take 100 mcg by mouth daily.   VITAMIN D PO Take by mouth.   No facility-administered encounter medications on file as of 09/20/2020.    Functional Status:  In your present state of health, do you have any difficulty performing the following activities: 02/01/2020  Hearing? N  Vision? N  Difficulty concentrating or making decisions? N  Walking or climbing stairs? N  Dressing or bathing? N  Doing errands, shopping? N  Preparing Food and eating ? N  Using the Toilet? N  In the past six months, have you accidently leaked urine? N  Do you have problems with loss of bowel control? Y  Comment occasionally  Managing your Medications? N  Managing your Finances? N  Housekeeping or managing your Housekeeping? N  Some recent data might be hidden    Fall/Depression Screening: Fall Risk  09/20/2020 06/15/2020 04/26/2020  Falls in the past year? '1 1 1  '$ Number falls in past yr: 0 0 0  Injury with Fall? 1 1 0  Comment - - -  Risk for fall due to : History of fall(s) History of fall(s) -  Risk for fall due to: Comment - - -  Follow up Falls evaluation completed Falls evaluation completed -  Comment - - -   PHQ 2/9 Scores 02/01/2020 01/12/2020 04/05/2019 01/19/2019 11/03/2018  11/03/2018 08/20/2018  PHQ - 2 Score 1 0 1 0 0 0 0  PHQ- 9 Score - - - - - - -    Assessment:   Care Plan Care Plan : COPD (Adult)  Updates made by Verlin Grills, RN since 09/20/2020 12:00 AM     Problem: Psychological Adjustment to Diagnosis (COPD)   Priority: Medium  Onset Date: 12/20/2019     Long-Range Goal: Adjustment to Disease Achieved   Start Date: 03/20/2020  Expected End Date: 02/01/2021  This Visit's Progress: On track  Priority: Medium  Note:   Evidence-based guidance:  Explore the patient and family's understanding of the disease; use open-ended questions to encourage patient and family to share what is important to them.  Assess and provide support around experience of loss and lifestyle adjustments, such as loss of function, roles, social ties, independence and hobbies.  Support adjustment to ?onew normal? with focus on maintaining daily life as closely as possible to life before chronic obstructive pulmonary disease (COPD) diagnosis.  Express empathy; listen actively by encouraging the patient and family to express feelings, concerns and fears; ask questions and encourage open communication regarding embarrassing or disturbing topics.  Assess for factors that may impact coping or adjustment, such as a preexisting mental health condition, prognosis, lack of social support, debilitating disease or financial difficulty that include no or insufficient insurance coverage.  Assess and address fear or anxiety related to dyspnea or perceived stigma of a noninfectious cough.  Prepare for re-entry into work, school and social activities; establish links or collaborate with mental health resources in the community, such as peer support or advocacy groups.  Assess anxiety, feelings of panic when breathing becomes labored, as well as impact on family/caregiver; consider cognitive behavioral therapy, deep breathing, meditation, visualization, photo or light therapy.  Encourage  advanced care planning discussion to clarify goals of care; palliative care or hospice may be considered for advanced COPD patients if it aligns with patient's goals of care.  Explore common risk factors for depression, such as personal or family history of depression, substance use and stressors that include missed work, losing ability to do what patient was used to doing, changes in appearance, physical   or sexual abuse.  Destigmatize depression by presenting it as a problem requiring treatment, rather than a personal weakness.  Use a validated screening tool to identify level of suicide risk. If at risk, develop safety plan and implement additional safety measures based on level of risk, such as behavioral health referral or immediate assessment.   Notes:     Task: Support Psychosocial Response to Chronic Obstructive Pulmonary Disease   Due Date: 02/01/2021  Note:   Care Management Activities:    - decision-making supported - depression screen reviewed - emotional support provided - verbalization of feelings encouraged  Notes:     Problem: Disease Progression (COPD)   Priority: Medium  Onset Date: 10/02/2020     Long-Range Goal: Disease Progression Minimized or Managed   Start Date: 03/20/2020  Expected End Date: 02/01/2021  This Visit's Progress: On track  Note:   Evidence-based guidance:  Identify current smoking/tobacco use; provide smoking cessation intervention.  Assess symptom control by the frequency and type of symptoms, reliever use and activity limitation at every encounter.  Assess risk for exacerbation (flare up) by evaluating spirometry, pulse oximetry, reliever use, presentation of symptoms and activity limitation; anticipate treatment adjustment based on risks and resources.  Develop and/or review and reinforce use of COPD rescue (action) plan even when symptoms are controlled or infrequent.  Ask patient to bring inhaler to all visits; assess and reinforce  correct technique; address barriers to proper inhaler use, such as older age, use of multiple devices and lack of understanding.   Identify symptom triggers, such as smoking, virus, weather change, emotional upset, exercise, obesity and environmental allergen; consider reduction of work-exposure versus elimination to avoid compromising employment.  Correlate presentation to comorbidity, such as diabetes, heart failure, obstructive sleep apnea, depression and anxiety, which may worsen symptoms.  Prepare for individualized pharmacologic therapy that may include LABA (long-acting beta-2 agonist), LAMA (long-acting muscarinic antagonist), SABA (short-acting beta-2 agonist) oral or inhaled corticosteroid.  Promote participation in pulmonary rehabilitation for breathing exercises, skills training, improved exercise capacity, mood and quality of life; address barriers to participation.  Promote physical activity or exercise to improve or maintain exercise capacity, based on tolerance that may include walking, water exercise, cycling or limb muscle strength training.  Promote use of energy conservation and activity pacing techniques.  Promote use of breathing and coughing techniques, such as inspiratory muscle training, pursed-lip breathing, diaphragmatic breathing, pranayama yoga breathing or huff cough.  Screen for malnutrition risk factors, such as unintentional weight loss and poor oral intake; refer to dietitian if identified.  Consider recommendation for oral drink supplement or multivitamin and mineral supplements if suspect inadequate oral intake or micronutrient deficiencies.   Screen for obstructive sleep apnea; prepare patient for polysomnography based on risk and presentation.  Prepare patient for use of long-term oxygen and noninvasive ventilation to relieve hypercapnia, hypoxemia, obstructive sleep apnea and reduce work of breathing.  Prepare patient with worsening disease for surgical  interventions that may include bronchoscopy, lung volume reduction surgery, bullectomy or lung transplantation.   Notes:     Task: Alleviate Barriers to COPD Management   Due Date: 02/01/2021  Note:   Care Management Activities:    - activity or exercise based on tolerance encouraged - barriers to treatment managed - communicable disease prevention promoted - medication-adherence assessment completed - rescue (action) plan reviewed - self-awareness of symptom triggers encouraged    Notes:     Problem: Symptom Exacerbation (COPD)   Priority: Medium  Onset Date: 03/20/2020     Long-Range Goal: Symptom Exacerbation Prevented or Minimized   Start Date: 12/20/2019  Expected End Date: 10/02/2020  Priority: Medium  Note:   Evidence-based guidance:  Monitor for signs of respiratory infection, including changes in sputum color, volume and thickness, as well as fever.  Encourage infection prevention strategies that may include prophylactic antibiotic therapy for patients with history of frequent exacerbations or antibiotic administration during exacerbation based on presentation, risk and benefit.  Encourage receipt of influenza and pneumococcal vaccine.  Prepare patient for use of home long-term oxygen therapy in presence of sever resting hypoxemia.  Prepare  patients for laboratory studies or diagnostic exams, such as spirometry, pulse oximetry and arterial blood gas based on current symptoms, risk factors and presentation.  Assess barriers and manage adherence, including inhaler technique and persistent trigger exposure; encourage adherence, even when symptoms are controlled or infrequent.  Assess and monitor for signs/symptoms of psychosocial concerns, such as shortness of breath-anxiety cycle or depression that may impact stability of symptoms.  Identify economic resources, sociocultural beliefs, social factors and health literacy that may interfere with adherence.  Promote lifestyle  changes when needed, including regular physical activity based on tolerance, weight loss, healthy eating and stress management.  Consider referral to nurse or community health worker or home-visiting program for intensive support and education (disease-management program).  Increase frequency of follow-up following exacerbation or hospitalization; consider transition of care interventions, such as hospital visit, home visit, telephone follow-up, review of discharge summary and resource referrals.   Notes:     Task: Identify and Minimize Risk of COPD Exacerbation   Due Date: 02/01/2021  Note:   Care Management Activities:    - healthy lifestyle promoted - necessary immunization facilitated - rescue (action) plan reviewed - signs/symptoms of infection reviewed - signs/symptoms of worsening disease assessed - symptom triggers identified - treatment plan reviewed    Notes:       Goals Addressed             This Visit's Progress    (THN)Make and Keep All Appointments   On track    Timeframe:  Long-Range Goal Priority:  Medium Start Date:   [id number]                          Expected End Date:  [id number]                    Follow Up Date [id number]   - call to cancel if needed - keep a calendar with prescription refill dates - keep a calendar with appointment dates    Why is this important?   Part of staying healthy is seeing the doctor for follow-up care.  If you forget your appointments, there are some things you can do to stay on track.    Notes:  [id number] Patient is keeping all appointments     [facility] Fatigue (Tiredness)   On track    Timeframe:  Long-Range Goal Priority:  Medium Start Date: [id number]                            Expected End Date: [id number]                 Follow Up Date [id number]   - eat healthy - get outdoors every day (weather permitting) - use devices that will help like a cane, sock-puller or reacher    Why is this important?   Feeling  tired or worn out is a common symptom of COPD (chronic obstructive pulmonary disease).  Learning when you feel your best and when you need rest is important.  Managing the tiredness (fatigue) will help you be active and enjoy life.     Notes:  [id number] Patient has been doing yard work. She takes rest periods      [facility] My Emotions   On track    Timeframe:  Long-Range Goal Priority:  Medium Start Date:    [id number]  Expected End Date:      [id number]                Follow Up [id number]   - talk about feelings with a friend, family or spiritual advisor    Why is this important?   When you are stressed, down or upset, your body reacts too.  For example, your blood pressure may get higher; you may have a headache or stomachache.  When your emotions get the best of you, your body's ability to fight off cold and flu gets weak.  These steps will help you manage your emotions.     Notes:  Patient daughter will be staying with her during recovery time.  Patient daughter has left/ Patient is feeling some loneliness [id number] Patient takes with friend and minister  [id number] Patient has spiritual support from minister and friends     (THN)Track and Manage My Symptoms   On track    Timeframe:  Long-Range Goal Priority:  Medium Start Date:   [id number]                          Expected End Date:    [id number] Follow Up Date [id number] - develop a rescue plan - eliminate symptom triggers at home - follow rescue plan if symptoms flare-up - keep follow-up appointments    Why is this important?   Tracking your symptoms and other information about your health helps your doctor plan your care.  Write down the symptoms, the time of day, what you were doing and what medicine you are taking.  You will soon learn how to manage your symptoms.     Notes:  [id number] patient is monitoring symptoms. No COPD exacerbation [id number] no admissions for COPD since last outreach      (THN)Track and Manage My Triggers   On track    Timeframe:  Long-Range Goal Priority:  Medium Start Date: [id number]                            Expected End Date:   [id number]                   Follow Up Date [id number] - identify and remove indoor air pollutants - limit outdoor activity during cold weather - listen for public air quality announcements every day    Why is this important?   Triggers are activities or things, like tobacco smoke or cold weather, that make your COPD (chronic obstructive pulmonary disease) flare-up.  Knowing these triggers helps you plan how to stay away from them.  When you cannot remove them, you can learn how to manage them.     Notes:  [id number] Patient is monitoring her symptoms        Plan:  Follow-up: Follow-up in 3 month(s) RN sent out COPD quick tips RN sent update assessment to PCP   [place] Management [phone]

## 2020-09-20 NOTE — Patient Outreach (Signed)
[place] [facility]) Care Management  09/20/2020  KALYNNE GOODACRE Jun 25, 1935 [id number]   RN Health Coach attempted follow up outreach call to patient.  Patient was unavailable. No voicemail pick up.  Plan: RN will call patient again within 30 days.  [place] Care Management [phone]

## 2020-10-01 ENCOUNTER — Other Ambulatory Visit: Payer: Self-pay

## 2020-10-01 ENCOUNTER — Encounter: Payer: Self-pay | Admitting: Pulmonary Disease

## 2020-10-01 ENCOUNTER — Ambulatory Visit (INDEPENDENT_AMBULATORY_CARE_PROVIDER_SITE_OTHER): Payer: Medicare Other | Admitting: Pulmonary Disease

## 2020-10-01 VITALS — BP 140/70 | HR 64 | Temp 97.7°F | Ht 60.0 in | Wt 127.2 lb

## 2020-10-01 DIAGNOSIS — R058 Other specified cough: Secondary | ICD-10-CM | POA: Diagnosis not present

## 2020-10-01 DIAGNOSIS — J449 Chronic obstructive pulmonary disease, unspecified: Secondary | ICD-10-CM

## 2020-10-01 DIAGNOSIS — J9611 Chronic respiratory failure with hypoxia: Secondary | ICD-10-CM

## 2020-10-01 DIAGNOSIS — J4489 Other specified chronic obstructive pulmonary disease: Secondary | ICD-10-CM

## 2020-10-01 NOTE — Patient Instructions (Addendum)
Try using flonase as needed for sinus congestion, runny nose and post nasal drip  Follow up in 6 months

## 2020-10-01 NOTE — Progress Notes (Signed)
[facility]  Chief Complaint  Patient presents with   Follow-up    Pt states when doing something heavy she get SOB.      Constitutional:  BP 140/70 (BP Location: Left Arm, Patient Position: Sitting, Cuff Size: Normal)   Pulse 64   Temp 97.7 F (36.5 C) (Oral)   Ht 5' (1.524 m)   Wt 127 lb 3.2 oz (57.7 kg)   SpO2 97%   BMI 24.84 kg/m   Past Medical History:  Chronic cystitis, DDD, Anxiety, Depression, C diff 2017 and 2018, Diverticulosis, GERD, HTN, Hypothyroidism, PNA, Shingles, Spinal stenosis  Past Surgical History:  She  has a past surgical history that includes Cholecystectomy (2001); Tubal ligation (1974); Abdominal hysterectomy (1978); Breast cyst excision; Appendectomy; Tonsillectomy; Colonoscopy (04/2005; 12/06/2015); transthoracic echocardiogram (08/2014); Total hip arthroplasty (Right, 11/01/2014); Hip Closed Reduction (Right, 12/25/2015); DEXA (05/18/2017; 08/7019); Total hip arthroplasty (Left, 12/08/2017); and NCV/EMS (10/2018).  Brief Summary:  Priscilla Cantrell is a 85 y.o. female former smoker with GOLD 4 COPD, ACE cough, and nocturnal hypoxemia.       Subjective:   She has been getting episodes in which she can't control her bladder when she is doing more strenuous activity.  She doesn't usually wear oxygen during the day.  She has been getting more sinus congestion and post nasal drip.  This triggers throat irritation and cough.  Happens mostly in the morning.  She goes for walks few times per day with her dog.  She plans to take up a job at [employer] to help stay active.  She sleeps okay.  Uses 2 liters oxygen at night.  Her SpO2 dropped to 87% on room with ambulatory oximetry.  She was recovered on 2 liters oxygen to 96%.  Physical Exam:   Appearance - well kempt   ENMT - no sinus tenderness, no oral exudate, no LAN, Mallampati 2 airway, no stridor  Respiratory - equal breath sounds bilaterally, no wheezing  or rales  CV - s1s2 regular rate and rhythm, no murmurs  Ext - no clubbing, no edema  Skin - no rashes  Psych - normal mood and affect    Pulmonary testing:  Spirometry 01/10/09>>FEV1 0.97(52%), FEV1% 47 March 2012>>Daliresp started RAST 05/04/17 >> negative, IgE 8 Spirometry 05/14/17 >> FEV1 0.91 (55%), FEV1% 48 PFT 10/29/18 >> FEV1 1.05 (68%), FEV1% 52, TLC 4.29 (96%), DLCO 44%  Sleep Tests:  PSG 11/14/11 >> AHI 0.3  ONO with RA 07/31/16 >> test time 6 hrs 41 min.  Average SpO2 87%, low SpO2 74%.  Spent 4 hrs 13 min with SpO2 < 88%.  Cardiac Tests:  Echo 08/30/14 >> EF 60 to [id number], grade 1 diastolic dysfx, PAS 24 mmHG  Social History:  She  reports that she quit smoking about 30 years ago. Her smoking use included cigarettes. She started smoking about 64 years ago. She has a 51.00 pack-year smoking history. She has never used smokeless tobacco. She reports current alcohol use. She reports that she does not use drugs.  Family History:  Her family history includes Cancer in her maternal aunt and paternal grandfather; Cirrhosis in her father; Diabetes in her daughter; Goiter in her mother; Hernia in her paternal grandmother; Hypertension in her paternal grandmother; Psoriasis in her father; Stroke in her maternal grandfather and paternal grandmother.     Assessment/Plan:   GOLD 4 COPD with chronic bronchitis. - continue trelegy and daliresp - prn albuterol - encouraged her to maintain her  physical activity  Allergic rhinitis with upper airway cough. - could be related to dust mite exposure - discussed environmental control techniques - advised her to try using flonase and azelastine on regular basis for now - prn zyrtec   Chronic respiratory failure with hypoxia. - discussed role for supplemental oxygen - goal SpO2 > 90% - advised her to use supplemental oxygen on more regular basis during the day and continue 2 liters at night  Time Spent Involved in Patient Care on Day of  Examination:  35 minutes  Follow up:   Patient Instructions  Try using flonase as needed for sinus congestion, runny nose and post nasal drip  Follow up in 6 months  Medication List:   Allergies as of 10/01/2020       Reactions   Losartan Other (See Comments)   hyperkalemia   Penicillins Hives, Swelling, Rash   Has patient had a PCN reaction causing immediate rash, facial/tongue/throat swelling, SOB or lightheadedness with hypotension: YES Has patient had a PCN reaction causing severe rash involving mucus membranes or skin necrosis: NO Has patient had a PCN reaction that required hospitalization NO Has patient had a PCN reaction occurring within the last 10 years: NO If all of the above answers are "NO", then may proceed with Cephalosporin use.   Cefixime [kdc:cefixime] Other (See Comments)   unspecified   Gabapentin Other (See Comments)   Feels woozy   Indocin [indomethacin] Other (See Comments)   Painful tongue and throat   Singulair [montelukast Sodium]    Chest tightness, decreased mental clarity   Sulfa Antibiotics Other (See Comments)   unspecified   Ace Inhibitors Other (See Comments)   cough   Statins Other (See Comments)   Myalgias        Medication List        Accurate as of October 01, 2020 12:59 PM. If you have any questions, ask your nurse or doctor.          albuterol 108 (90 Base) MCG/ACT inhaler Commonly known as: ProAir HFA Inhale 2 puffs into the lungs every 4 (four) hours as needed for wheezing or shortness of breath.   alendronate 70 MG tablet Commonly known as: FOSAMAX TAKE 1 TABLET(70 MG) BY MOUTH 1 TIME A WEEK WITH A FULL GLASS OF WATER AND ON AN EMPTY STOMACH   amLODipine 5 MG tablet Commonly known as: NORVASC TAKE 1 TABLET(5 MG) BY MOUTH DAILY   aspirin EC 81 MG tablet Take 81 mg by mouth daily.   azelastine 0.1 % nasal spray Commonly known as: ASTELIN Place 2 sprays into both nostrils 2 (two) times daily. Use in each nostril  as directed   cetirizine 10 MG tablet Commonly known as: ZYRTEC Take 1 tablet (10 mg total) by mouth daily.   clobetasol 0.05 % external solution Commonly known as: TEMOVATE APPLY TO THE AFFECTED AREA EVERY DAY   diazepam 5 MG tablet Commonly known as: VALIUM TAKE 1 TABLET BY MOUTH DAILY AS NEEDED FOR ANXIETY AND INSOMNIA   fluorometholone 0.1 % ophthalmic suspension Commonly known as: FML Place 1 drop into both eyes as directed. Tapering off   fluticasone 50 MCG/ACT nasal spray Commonly known as: FLONASE Place 2 sprays into both nostrils daily.   furosemide 20 MG tablet Commonly known as: LASIX TAKE 1 TABLET BY MOUTH EVERY DAY AS NEEDED FOR INCREASED FLUID COLLECTION IN LEGS   mirabegron ER 25 MG Tb24 tablet Commonly known as: MYRBETRIQ Take 1 tablet (25 mg total)  by mouth daily.   nitrofurantoin 50 MG capsule Commonly known as: MACRODANTIN Take 1 capsule (50 mg total) by mouth at bedtime.   PROBIOTIC DAILY PO Take 1 capsule by mouth daily.   roflumilast 500 MCG Tabs tablet Commonly known as: DALIRESP Take 1 tablet (500 mcg total) by mouth daily.   Soothe XP Soln Place 1 drop into both eyes 2 (two) times daily as needed (irritation).   Synthroid 88 MCG tablet Generic drug: levothyroxine TAKE 1 TABLET(88 MCG) BY MOUTH DAILY   Trelegy Ellipta 100-62.5-25 MCG/INH Aepb Generic drug: Fluticasone-Umeclidin-Vilant INHALE 1 PUFF INTO THE LUNGS DAILY   triamcinolone cream 0.1 % Commonly known as: KENALOG Apply 1 application topically 2 (two) times daily.   venlafaxine 50 MG tablet Commonly known as: EFFEXOR TAKE 1 TABLET BY MOUTH TWICE DAILY   Visine 0.05 % ophthalmic solution Generic drug: tetrahydrozoline 1 drop  as needed   vitamin B-12 100 MCG tablet Commonly known as: CYANOCOBALAMIN Take 100 mcg by mouth daily.   VITAMIN D PO Take by mouth.        Signature:  Chesley Mires, MD [place] Pager - [phone] 10/01/2020,  12:59 PM

## 2020-10-02 ENCOUNTER — Encounter: Payer: Self-pay | Admitting: Urology

## 2020-10-02 ENCOUNTER — Telehealth (INDEPENDENT_AMBULATORY_CARE_PROVIDER_SITE_OTHER): Payer: Medicare Other | Admitting: Urology

## 2020-10-02 DIAGNOSIS — R3915 Urgency of urination: Secondary | ICD-10-CM

## 2020-10-02 DIAGNOSIS — N3021 Other chronic cystitis with hematuria: Secondary | ICD-10-CM | POA: Diagnosis not present

## 2020-10-02 MED ORDER — NITROFURANTOIN MACROCRYSTAL 50 MG PO CAPS: 50.0000 mg | ORAL_CAPSULE | Freq: Every day | ORAL | 3 refills | Status: DC

## 2020-10-02 MED ORDER — GEMTESA 75 MG PO TABS: 1.0000 | ORAL_TABLET | Freq: Every day | ORAL | 0 refills | Status: DC

## 2020-10-02 NOTE — Patient Instructions (Signed)
Overactive Bladder, Adult °Overactive bladder is a condition in which a person has a sudden and frequent need to urinate. A person might also leak urine if he or she cannot get to the bathroom fast enough (urinary incontinence). Sometimes, symptoms can interfere with work or social activities. °What are the causes? °Overactive bladder is associated with poor nerve signals between your bladder and your brain. Your bladder may get the signal to empty before it is full. You may also have very sensitive muscles that make your bladder squeeze too soon. This condition may also be caused by other factors, such as: °Medical conditions: °Urinary tract infection. °Infection of nearby tissues. °Prostate enlargement. °Bladder stones, inflammation, or tumors. °Diabetes. °Muscle or nerve weakness, especially from these conditions: °A spinal cord injury. °Stroke. °Multiple sclerosis. °Parkinson's disease. °Other causes: °Surgery on the uterus or urethra. °Drinking too much caffeine or alcohol. °Certain medicines, especially those that eliminate extra fluid in the body (diuretics). °Constipation. °What increases the risk? °You may be at greater risk for overactive bladder if you: °Are an older adult. °Smoke. °Are going through menopause. °Have prostate problems. °Have a neurological disease, such as stroke, dementia, Parkinson's disease, or multiple sclerosis (MS). °Eat or drink alcohol, spicy food, caffeine, and other things that irritate the bladder. °Are overweight or obese. °What are the signs or symptoms? °Symptoms of this condition include a sudden, strong urge to urinate. Other symptoms include: °Leaking urine. °Urinating 8 or more times a day. °Waking up to urinate 2 or more times overnight. °How is this diagnosed? °This condition may be diagnosed based on: °Your symptoms and medical history. °A physical exam. °Blood or urine tests to check for possible causes, such as infection. °You may also need to see a health care  provider who specializes in urinary tract problems. This is called a urologist. °How is this treated? °Treatment for overactive bladder depends on the cause of your condition and whether it is mild or severe. Treatment may include: °Bladder training, such as: °Learning to control the urge to urinate by following a schedule to urinate at regular intervals. °Doing Kegel exercises to strengthen the pelvic floor muscles that support your bladder. °Special devices, such as: °Biofeedback. This uses sensors to help you become aware of your body's signals. °Electrical stimulation. This uses electrodes placed inside the body (implanted) or outside the body. These electrodes send gentle pulses of electricity to strengthen the nerves or muscles that control the bladder. °Women may use a plastic device, called a pessary, that fits into the vagina and supports the bladder. °Medicines, such as: °Antibiotics to treat bladder infection. °Antispasmodics to stop the bladder from releasing urine at the wrong time. °Tricyclic antidepressants to relax bladder muscles. °Injections of botulinum toxin type A directly into the bladder tissue to relax bladder muscles. °Surgery, such as: °A device may be implanted to help manage the nerve signals that control urination. °An electrode may be implanted to stimulate electrical signals in the bladder. °A procedure may be done to change the shape of the bladder. This is done only in very severe cases. °Follow these instructions at home: °Eating and drinking ° °Make diet or lifestyle changes recommended by your health care provider. These may include: °Drinking fluids throughout the day and not only with meals. °Cutting down on caffeine or alcohol. °Eating a healthy and balanced diet to prevent constipation. This may include: °Choosing foods that are high in fiber, such as beans, whole grains, and fresh fruits and vegetables. °Limiting foods that are   high in fat and processed sugars, such as fried  and sweet foods. °Lifestyle ° °Lose weight if needed. °Do not use any products that contain nicotine or tobacco. These include cigarettes, chewing tobacco, and vaping devices, such as e-cigarettes. If you need help quitting, ask your health care provider. °General instructions °Take over-the-counter and prescription medicines only as told by your health care provider. °If you were prescribed an antibiotic medicine, take it as told by your health care provider. Do not stop taking the antibiotic even if you start to feel better. °Use any implants or pessary as told by your health care provider. °If needed, wear pads to absorb urine leakage. °Keep a log to track how much and when you drink, and when you need to urinate. This will help your health care provider monitor your condition. °Keep all follow-up visits. This is important. °Contact a health care provider if: °You have a fever or chills. °Your symptoms do not get better with treatment. °Your pain and discomfort get worse. °You have more frequent urges to urinate. °Get help right away if: °You are not able to control your bladder. °Summary °Overactive bladder refers to a condition in which a person has a sudden and frequent need to urinate. °Several conditions may lead to an overactive bladder. °Treatment for overactive bladder depends on the cause and severity of your condition. °Making lifestyle changes, doing Kegel exercises, keeping a log, and taking medicines can help with this condition. °This information is not intended to replace advice given to you by your health care provider. Make sure you discuss any questions you have with your health care provider. °Document Revised: 10/10/2019 Document Reviewed: 10/10/2019 °[employer] Patient Education © 2022 [employer] ° °

## 2020-10-02 NOTE — Progress Notes (Signed)
10/02/2020 1:19 PM   Priscilla Cantrell 1935/12/05 [id number]  Referring provider: [name], MD [address] [place],  [place] [address]  Patient location: home Physician location: office I connected with  Priscilla Cantrell on 10/02/20 by a video enabled telemedicine application and verified that I am speaking with the correct person using two identifiers.   I discussed the limitations of evaluation and management by telemedicine. The patient expressed understanding and agreed to proceed.    Followup recurrent UTI  HPI: Priscilla Cantrell is a 85yo here for followuop for recurrent UTI. She takes macrobid '50mg'$  qhs prophylaxis.  No UTIs since last visit. She has nocturia 1-2x and occasional urgency and urge incontinence. She has previously tried mirabegron which failed to improve her urgency. No other complaints today.    PMH: Past Medical History:  Diagnosis Date   Allergic rhinitis    with upper airway cough: as of 07/2017 pulm f/u she was instructed to use astelin, flonase, and saline more consistently   BPPV (benign paroxysmal positional vertigo) 03/2018   Chest pain, non-cardiac    Cardiac CT showed no signif obstructive dz, mildly elevated calcium level (Dr. [name]).   Chronic cystitis with hematuria    [name]   Chronic hypoxemic respiratory failure ([place]) 02/02/2015   2L oxygen 24/7   Chronic renal insufficiency, stage 2 (mild)    GFR approx 60   Cold hands    NCS/EMGs normal.  Hand dysesthesias per neuro--reassured   COPD (chronic obstructive pulmonary disease) ([place])    GOLD 4.  spirometry 01/10/09 FEV 0.97(52%), FEV1% 47.  With chronic bronchitis as of 07/2017.   DDD (degenerative disc disease), cervical    1998 MRI C5-6 impingemt (left).  MRI 2021 (neurol) 1 level canal stenosis, 1 level foraminal stenosis--->PT   DDD (degenerative disc disease), lumbar    Depression with anxiety 04/15/2011   Diarrheal disease Summer 2017   ? refractor C diff vs  post infectious diarrhea predominant syndrome--GI told her to avoid lactose, sorbitol, and caffeine (08/30/15--[name]).  As of 10/05/15 pt reports GI dx'd her with C diff and rx'd more flagyl and she is also on cholestyramine.  As of 02/2016, pt's sx's resolved completely.   Diverticulosis of colon    Procto '97 and colonoscopy 2002   Dysplastic nevus of upper extremity 04/2014   R tricep ([name])   Edema of both lower extremities due to peripheral venous insufficiency    Varicosities bilat, swelling L>R.  R baker's cyst.  Got vein clinic eval 02/2018->sclerotherapy recommended but since no hemorrhage or ulceration, insurance will not cover the procedure.   Family history of adverse reaction to anesthesia    mother died when patient age 76 receiving anesthesia for thyroid surgery   Fibrocystic breast disease    w/fibroadenoma.  Bx's showed NO atypia (Dr. Margot Chimes).  Mammo neg 2008.   GERD (gastroesophageal reflux disease)    History of double vision    Ophthalmologist, Dr. Herbert Deaner, is further evaluating this with MRI  orbits and limited brain (myesthenia gravis testing neg 07/2010)   History of home oxygen therapy    uses 2 liters at hs   Hypertension    EKG 03/2010 normal   Hypothyroidism    Hashimoto's, dx'd 1989   Idiopathic peripheral neuropathy 04/2018   Sensory (numb feet) S1 distribution bilat, c/w spinal stenosis sensory neuropathy (no pain). 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  Labs [place],  MRI C spine->some spondylosis/stenosis.  UE NCS/EMS: normal 04/2019.   Lesion of right native kidney 2017   found on CT 02/2015.  F/u MRI 03/31/15 showed it to be smaller and less likely of any significance but repeat CT renal protocol in [place] recommended by radiology.  This was done 10/26/15 and showed no interval change in the lesion, but b/c the lesion enhances with contrast, radiol rec'd urol consult (b/c pap renal RCC could  not be excluded).  Saw urol 03/06/16--stable on u/s 09/2016 and 09/2017.   Osteoarthritis, hip, bilateral    she is s/p bilat THA as of 02/2018.   Osteoporosis    radius, T score -2.5->rec'd alendronate 08/2019->plan rpt DEXA 08/2021.   Pneumonia    Postmenopausal atrophic vaginitis    improved with estrace 2 X/week.   Prolapse of vaginal cuff after hysterectomy    10/2018: Dr. FWayne Both Mild colpocele 04/2019 per GYN (no surgery)   Pulmonary nodule    CT chest 12/10, June 2011.  No nodule on CT 06/2010.   Recurrent Clostridium difficile diarrhea    Episode 09/19/2016: resolved with prolonged course of flagyl.  11/2016 recurrence treated with 10d of Dificid (Fidaxomicin) by GI (Dr. MEarlean Shawl.   Recurrent UTI    likely related to pt's atrophic vaginitis.  Urol started vaginal estrace 03/2016.   Rib fracture 09/2018   R 7th, laterally-->sustained when her dog pulled her over and she fell.   RLS (restless legs syndrome)    neuro->gabapentin trial 2020/21   Shingles 02/2014   R abd/flank   Spinal stenosis of lumbar region with neurogenic claudication    2008 MRI (L3-4, L4-5, L5-S1).  +S1 distribution sensory loss bilat    Surgical History: Past Surgical History:  Procedure Laterality Date   ABDOMINAL HYSTERECTOMY  1978   no BSO per pt--nonmalignant reasons   APPENDECTOMY     BREAST CYST EXCISION     Last screening mammogram 10/2010 was normal.   CHOLECYSTECTOMY  2001   COLONOSCOPY  04/2005; 12/06/2015   2007 NORMAL.  2017 adenomatous polyp x 1.  Mod sigmoid diverticulosis (Dr. MEarlean Shawl.     DEXA  05/18/2017; 08/7019   2019 NORMAL (T-score 0.1).  08/2019->T score -2.5->alendronate started->plan Rpt 08/2021.   HIP CLOSED REDUCTION Right 12/25/2015   Procedure: CLOSED REDUCTION HIP;  Surgeon: JNicholes Stairs MD;  Location: [facility];  Service: Orthopedics;  Laterality: Right;   NCV/EMS  10/2018   chronic and symmetric sensorimotor axonal polyneuropathy affecting the lower extremities   TONSILLECTOMY      TOTAL HIP ARTHROPLASTY Right 11/01/2014   Procedure: RIGHT TOTAL HIP ARTHROPLASTY;  Surgeon: RLatanya Maudlin MD;  Location: [facility];  Service: Orthopedics;  Laterality: Right;   TOTAL HIP ARTHROPLASTY Left 12/08/2017   Procedure: LEFT TOTAL HIP ARTHROPLASTY ANTERIOR APPROACH;  Surgeon: OParalee Cancel MD;  Location: [facility];  Service: Orthopedics;  Laterality: Left;  70 mins   TRANSTHORACIC ECHOCARDIOGRAM  08/2014   EF 60-65%, grade I diast dysfxn   TUBAL LIGATION  1974    Home Medications:  Allergies as of 10/02/2020       Reactions   Losartan Other (See Comments)   hyperkalemia   Penicillins Hives, Swelling, Rash   Has patient had a PCN reaction causing immediate rash, facial/tongue/throat swelling, SOB or lightheadedness with hypotension: YES Has patient had a PCN reaction causing severe rash involving mucus membranes or skin necrosis: NO Has patient had a PCN reaction that required hospitalization NO Has patient had a PCN  reaction occurring within the last 10 years: NO If all of the above answers are "NO", then may proceed with Cephalosporin use.   Cefixime [kdc:cefixime] Other (See Comments)   unspecified   Gabapentin Other (See Comments)   Feels woozy   Indocin [indomethacin] Other (See Comments)   Painful tongue and throat   Singulair [montelukast Sodium]    Chest tightness, decreased mental clarity   Sulfa Antibiotics Other (See Comments)   unspecified   Ace Inhibitors Other (See Comments)   cough   Statins Other (See Comments)   Myalgias        Medication List        Accurate as of October 02, 2020  1:19 PM. If you have any questions, ask your nurse or doctor.          albuterol 108 (90 Base) MCG/ACT inhaler Commonly known as: ProAir HFA Inhale 2 puffs into the lungs every 4 (four) hours as needed for wheezing or shortness of breath.   alendronate 70 MG tablet Commonly known as: FOSAMAX TAKE 1 TABLET(70 MG) BY MOUTH 1 TIME A WEEK WITH A FULL GLASS OF WATER  AND ON AN EMPTY STOMACH   amLODipine 5 MG tablet Commonly known as: NORVASC TAKE 1 TABLET(5 MG) BY MOUTH DAILY   aspirin EC 81 MG tablet Take 81 mg by mouth daily.   azelastine 0.1 % nasal spray Commonly known as: ASTELIN Place 2 sprays into both nostrils 2 (two) times daily. Use in each nostril as directed   cetirizine 10 MG tablet Commonly known as: ZYRTEC Take 1 tablet (10 mg total) by mouth daily.   clobetasol 0.05 % external solution Commonly known as: TEMOVATE APPLY TO THE AFFECTED AREA EVERY DAY   diazepam 5 MG tablet Commonly known as: VALIUM TAKE 1 TABLET BY MOUTH DAILY AS NEEDED FOR ANXIETY AND INSOMNIA   fluorometholone 0.1 % ophthalmic suspension Commonly known as: FML Place 1 drop into both eyes as directed. Tapering off   fluticasone 50 MCG/ACT nasal spray Commonly known as: FLONASE Place 2 sprays into both nostrils daily.   furosemide 20 MG tablet Commonly known as: LASIX TAKE 1 TABLET BY MOUTH EVERY DAY AS NEEDED FOR INCREASED FLUID COLLECTION IN LEGS   mirabegron ER 25 MG Tb24 tablet Commonly known as: MYRBETRIQ Take 1 tablet (25 mg total) by mouth daily.   nitrofurantoin 50 MG capsule Commonly known as: MACRODANTIN Take 1 capsule (50 mg total) by mouth at bedtime.   PROBIOTIC DAILY PO Take 1 capsule by mouth daily.   roflumilast 500 MCG Tabs tablet Commonly known as: DALIRESP Take 1 tablet (500 mcg total) by mouth daily.   Soothe XP Soln Place 1 drop into both eyes 2 (two) times daily as needed (irritation).   Synthroid 88 MCG tablet Generic drug: levothyroxine TAKE 1 TABLET(88 MCG) BY MOUTH DAILY   Trelegy Ellipta 100-62.5-25 MCG/INH Aepb Generic drug: Fluticasone-Umeclidin-Vilant INHALE 1 PUFF INTO THE LUNGS DAILY   triamcinolone cream 0.1 % Commonly known as: KENALOG Apply 1 application topically 2 (two) times daily.   venlafaxine 50 MG tablet Commonly known as: EFFEXOR TAKE 1 TABLET BY MOUTH TWICE DAILY   Visine 0.05 %  ophthalmic solution Generic drug: tetrahydrozoline 1 drop  as needed   vitamin B-12 100 MCG tablet Commonly known as: CYANOCOBALAMIN Take 100 mcg by mouth daily.   VITAMIN D PO Take by mouth.        Allergies:  Allergies  Allergen Reactions   Losartan Other (See Comments)  hyperkalemia   Penicillins Hives, Swelling and Rash    Has patient had a PCN reaction causing immediate rash, facial/tongue/throat swelling, SOB or lightheadedness with hypotension: YES Has patient had a PCN reaction causing severe rash involving mucus membranes or skin necrosis: NO Has patient had a PCN reaction that required hospitalization NO Has patient had a PCN reaction occurring within the last 10 years: NO If all of the above answers are "NO", then may proceed with Cephalosporin use.    Cefixime [Kdc:Cefixime] Other (See Comments)    unspecified   Gabapentin Other (See Comments)    Feels woozy   Indocin [Indomethacin] Other (See Comments)    Painful tongue and throat   Singulair [Montelukast Sodium]     Chest tightness, decreased mental clarity   Sulfa Antibiotics Other (See Comments)    unspecified   Ace Inhibitors Other (See Comments)    cough   Statins Other (See Comments)    Myalgias     Family History: Family History  Problem Relation Age of Onset   Goiter Mother    Psoriasis Father        od liver   Cirrhosis Father    Diabetes Daughter        Type 2   Stroke Maternal Grandfather    Stroke Paternal Grandmother    Hypertension Paternal Grandmother    Hernia Paternal Grandmother    Cancer Paternal Grandfather        lung   Cancer Maternal Aunt        cancer of the stomach    Social History:  reports that she quit smoking about 30 years ago. Her smoking use included cigarettes. She started smoking about 64 years ago. She has a 51.00 pack-year smoking history. She has never used smokeless tobacco. She reports current alcohol use. She reports that she does not use  drugs.  ROS: All other review of systems were reviewed and are negative except what is noted above in HPI   Laboratory Data: Lab Results  Component Value Date   WBC 6.2 05/09/2020   HGB 14.5 05/09/2020   HCT 44.0 05/09/2020   MCV 89.8 05/09/2020   PLT 255.0 05/09/2020    Lab Results  Component Value Date   CREATININE 1.04 (H) 09/07/2020    No results found for: PSA  No results found for: TESTOSTERONE  Lab Results  Component Value Date   HGBA1C 5.7 10/29/2011    Urinalysis    Component Value Date/Time   COLORURINE YELLOW 02/12/2020 1013   APPEARANCEUR Clear 05/25/2020 1141   LABSPEC 1.020 02/12/2020 1013   PHURINE 6.5 02/12/2020 1013   GLUCOSEU Negative 05/25/2020 1141   GLUCOSEU NEGATIVE 06/14/2015 1452   HGBUR LARGE (A) 02/12/2020 1013   BILIRUBINUR Negative 05/25/2020 1141   KETONESUR NEGATIVE 02/12/2020 1013   PROTEINUR Trace (A) 05/25/2020 1141   PROTEINUR >300 (A) 02/12/2020 1013   UROBILINOGEN 0.2 09/01/2018 1147   UROBILINOGEN 0.2 06/14/2015 1452   NITRITE Negative 05/25/2020 1141   NITRITE NEGATIVE 02/12/2020 1013   LEUKOCYTESUR 1+ (A) 05/25/2020 1141   LEUKOCYTESUR MODERATE (A) 02/12/2020 1013    Lab Results  Component Value Date   LABMICR See below: 05/25/2020   WBCUA 6-10 (A) 05/25/2020   LABEPIT 0-10 05/25/2020   MUCUS Present 05/25/2020   BACTERIA Moderate (A) 05/25/2020    Pertinent Imaging:  Results for orders placed during the hospital encounter of 06/06/15  DG Abd 1 View  Narrative CLINICAL DATA:  Anal leakage that she  states is not diarhhea x 2 weeks. Pt denies pain or nausea or vomiting. She states she had shingles a couple of months ago but finished up her antibiotics x 1 month ago.  EXAM: ABDOMEN - 1 VIEW  COMPARISON:  None.  FINDINGS: There is no bowel dilatation to suggest obstruction. There is no evidence of pneumoperitoneum, portal venous gas or pneumatosis. There are no pathologic calcifications along the  expected course of the ureters. There is a right total hip arthroplasty. There is moderate osteoarthritis of the left hip. There is lumbar spine spondylosis.  IMPRESSION: Unremarkable KUB.   Electronically Signed By: Kathreen Devoid On: 06/06/2015 16:07  Results for orders placed during the hospital encounter of 09/02/17  US Venous Img Lower Bilateral  Narrative CLINICAL DATA:  Bilateral lower extremity pain and edema. History of chronic mild venous insufficiency. History of varicose veins. Evaluate for DVT.  EXAM: BILATERAL LOWER EXTREMITY VENOUS DOPPLER ULTRASOUND  TECHNIQUE: Gray-scale sonography with graded compression, as well as color Doppler and duplex ultrasound were performed to evaluate the lower extremity deep venous systems from the level of the common femoral vein and including the common femoral, femoral, profunda femoral, popliteal and calf veins including the posterior tibial, peroneal and gastrocnemius veins when visible. The superficial great saphenous vein was also interrogated. Spectral Doppler was utilized to evaluate flow at rest and with distal augmentation maneuvers in the common femoral, femoral and popliteal veins.  COMPARISON:  None.  FINDINGS: RIGHT LOWER EXTREMITY  Common Femoral Vein: No evidence of thrombus. Normal compressibility, respiratory phasicity and response to augmentation.  Saphenofemoral Junction: No evidence of thrombus. Normal compressibility and flow on color Doppler imaging.  Profunda Femoral Vein: No evidence of thrombus. Normal compressibility and flow on color Doppler imaging.  Femoral Vein: No evidence of thrombus. Normal compressibility, respiratory phasicity and response to augmentation.  Popliteal Vein: No evidence of thrombus. Normal compressibility, respiratory phasicity and response to augmentation.  Calf Veins: No evidence of thrombus. Normal compressibility and flow on color Doppler imaging.  Superficial  Great Saphenous Vein: No evidence of thrombus. Normal compressibility.  Venous Reflux:  None.  Other Findings: There is an approximately 3.8 x 1.6 x 3.3 cm anechoic serpiginous fluid collection with the right popliteal fossa compatible with a Baker cyst.  LEFT LOWER EXTREMITY  Common Femoral Vein: No evidence of thrombus. Normal compressibility, respiratory phasicity and response to augmentation.  Saphenofemoral Junction: No evidence of thrombus. Normal compressibility and flow on color Doppler imaging.  Profunda Femoral Vein: No evidence of thrombus. Normal compressibility and flow on color Doppler imaging.  Femoral Vein: No evidence of thrombus. Normal compressibility, respiratory phasicity and response to augmentation.  Popliteal Vein: No evidence of thrombus. Normal compressibility, respiratory phasicity and response to augmentation.  Calf Veins: No evidence of thrombus. Normal compressibility and flow on color Doppler imaging.  Superficial Great Saphenous Vein: No evidence of thrombus. Normal compressibility.  Venous Reflux:  None.  Other Findings:  None.  IMPRESSION: 1. No evidence of DVT within either lower extremity. 2. Incidentally noted 3.8 cm right-sided Baker's cyst.   Electronically Signed By: Sandi Mariscal M.D. On: 09/02/2017 15:46  No results found for this or any previous visit.  No results found for this or any previous visit.  No results found for this or any previous visit.  No results found for this or any previous visit.  No results found for this or any previous visit.  No results found for this or any previous visit.  Assessment & Plan:    1. Chronic cystitis with hematuria -Continue macrobid '50mg'$    2. OAB/urinary urgency -We will trial gemtesa '75mg'$     No follow-ups on file.  Nicolette Bang, MD  [facility] [place]

## 2020-10-04 ENCOUNTER — Telehealth: Payer: Self-pay

## 2020-10-04 NOTE — Telephone Encounter (Signed)
Patient calling about urgency medication that her and doctor McKenzie discussed was not at the pharmacy for pick up.  The patient did have the UTI prevention medication.  Please advise.  Thanks, Helene Kelp

## 2020-10-04 NOTE — Telephone Encounter (Signed)
Gemtesa samples left at front desk for pt. Message left for patient samples left for pick up.

## 2020-10-10 ENCOUNTER — Other Ambulatory Visit: Payer: Self-pay | Admitting: Family Medicine

## 2020-10-11 ENCOUNTER — Other Ambulatory Visit: Payer: Self-pay | Admitting: *Deleted

## 2020-10-12 NOTE — Patient Outreach (Signed)
[facility] [facility]) Care Management  10/12/2020  Priscilla Cantrell 1935-10-23 [id number]  Referral for medication assistance from Johny Shock, RN sent to [place].  Ina Homes [facility] Management Assistant [phone]

## 2020-10-15 ENCOUNTER — Telehealth: Payer: Self-pay | Admitting: Pulmonary Disease

## 2020-10-16 ENCOUNTER — Other Ambulatory Visit: Payer: Self-pay

## 2020-10-16 ENCOUNTER — Ambulatory Visit: Payer: Medicare Other | Admitting: Dermatology

## 2020-10-16 DIAGNOSIS — J9611 Chronic respiratory failure with hypoxia: Secondary | ICD-10-CM

## 2020-10-16 DIAGNOSIS — J449 Chronic obstructive pulmonary disease, unspecified: Secondary | ICD-10-CM

## 2020-10-16 MED ORDER — TRELEGY ELLIPTA 100-62.5-25 MCG/INH IN AEPB: 1.0000 | INHALATION_SPRAY | Freq: Every day | RESPIRATORY_TRACT | 6 refills | Status: DC

## 2020-10-16 NOTE — Telephone Encounter (Signed)
Prescription printed to be signed and faxed.  Nothing further needed at this time.

## 2020-10-23 ENCOUNTER — Telehealth: Payer: Self-pay | Admitting: Pharmacy Technician

## 2020-10-23 DIAGNOSIS — Z596 Low income: Secondary | ICD-10-CM

## 2020-10-23 DIAGNOSIS — Z5986 Financial insecurity: Secondary | ICD-10-CM

## 2020-10-23 NOTE — Progress Notes (Signed)
[place] [facility])                                            [place] Team    10/23/2020  FANTA WIMBERLEY 06/26/1935 [id number]                                      Medication Assistance Referral  Referral From: [place]  Medication/Company: Treleg[employer] / [place] Patient application portion:  Mailed Provider application portion: Faxed  to Dr. Chesley Mires Provider address/fax verified via: Office website   Delaney Perona P. Marcy Bogosian, [place]  [phone]

## 2020-10-29 ENCOUNTER — Ambulatory Visit (INDEPENDENT_AMBULATORY_CARE_PROVIDER_SITE_OTHER): Payer: Medicare Other | Admitting: Neurology

## 2020-10-29 ENCOUNTER — Encounter: Payer: Self-pay | Admitting: Neurology

## 2020-10-29 ENCOUNTER — Telehealth: Payer: Self-pay

## 2020-10-29 ENCOUNTER — Other Ambulatory Visit: Payer: Self-pay

## 2020-10-29 VITALS — BP 116/67 | HR 80 | Ht 60.0 in | Wt 127.0 lb

## 2020-10-29 DIAGNOSIS — G629 Polyneuropathy, unspecified: Secondary | ICD-10-CM

## 2020-10-29 DIAGNOSIS — G2581 Restless legs syndrome: Secondary | ICD-10-CM

## 2020-10-29 DIAGNOSIS — M4712 Other spondylosis with myelopathy, cervical region: Secondary | ICD-10-CM

## 2020-10-29 NOTE — Telephone Encounter (Signed)
Patient called near end of day.  She has some low blood pressure readings.  117 / 63  and 116 / 60 (something). She has started working 3 days a week at [employer]. She doesn't know if related or not.  Patient aware of no follow up call until tomorrow; upon Dr. Idelle Leech return to office.  Please call patient on her cell [phone]

## 2020-10-29 NOTE — Patient Instructions (Signed)
We will refer you to [facility] PT  Return to clinic 6 months

## 2020-10-29 NOTE — Progress Notes (Signed)
Follow-up Visit   Date: 10/29/20   Priscilla Cantrell MRN: [id number] DOB: [birth year]   Interim History: Priscilla Cantrell is a 85 y.o. right-handed Caucasian female with GERD, hypertension, cervical canal stenosis returning to the clinic for follow-up of neuropathy.  The patient was accompanied to the clinic by self.  Numbness in the feet is unchanged.  Balance is dependent on the surface that she is walking on.  No falls.  She walks unassissted.  Cramps well-controlled on mustard.  At her last visit, she was having more pain and weakness, which has improved.  She did not hear back from neurosurgery for evaluation.  MRI cervical spine shows multilevel cervical spondylosis with foraminal stenosis and canal worse at C4-5 and C5-6.   She has become more forgetful with appointments.  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     Current Outpatient Medications on File Prior to Visit  Medication Sig   albuterol (PROAIR HFA) 108 (90 Base) MCG/ACT inhaler Inhale 2 puffs into the lungs every 4 (four) hours as needed for wheezing or shortness of breath.   alendronate (FOSAMAX) 70 MG tablet TAKE 1 TABLET(70 MG) BY MOUTH 1 TIME A WEEK WITH A FULL GLASS OF WATER AND ON AN EMPTY STOMACH   amLODipine (NORVASC) 5 MG tablet TAKE 1 TABLET(5 MG) BY MOUTH DAILY   Artificial Tear Solution (SOOTHE XP) SOLN Place 1 drop into both eyes 2 (two) times daily as needed (irritation).   aspirin EC 81 MG tablet Take 81 mg by mouth daily.   azelastine (ASTELIN) 0.1 % nasal spray Place 2 sprays into both nostrils 2 (two) times daily. Use in each nostril as directed   cetirizine (ZYRTEC) 10 MG tablet Take 1 tablet (10 mg total) by mouth daily.   clobetasol (TEMOVATE) 0.05 % external solution APPLY TO THE AFFECTED AREA EVERY DAY   diazepam (VALIUM) 5 MG tablet  TAKE 1 TABLET BY MOUTH DAILY AS NEEDED FOR ANXIETY AND INSOMNIA   fluorometholone (FML) 0.1 % ophthalmic suspension Place 1 drop into both eyes as directed. Tapering off   fluticasone (FLONASE) 50 MCG/ACT nasal spray Place 2 sprays into both nostrils daily.   Fluticasone-Umeclidin-Vilant (TRELEGY ELLIPTA) 100-62.5-25 MCG/INH AEPB Inhale 1 puff into the lungs daily.   furosemide (LASIX) 20 MG tablet TAKE 1 TABLET BY MOUTH EVERY DAY AS NEEDED FOR INCREASED FLUID COLLECTION IN LEGS   mirabegron ER (MYRBETRIQ) 25 MG TB24 tablet Take 1 tablet (25 mg total) by mouth daily.   nitrofurantoin (MACRODANTIN) 50 MG capsule Take 1 capsule (50 mg total) by mouth at bedtime.   Probiotic Product (PROBIOTIC DAILY PO) Take 1 capsule by mouth daily.    roflumilast (DALIRESP) 500 MCG TABS tablet Take 1 tablet (500 mcg total) by mouth daily.   SYNTHROID 88 MCG tablet TAKE 1 TABLET(88 MCG) BY MOUTH DAILY   triamcinolone cream (KENALOG) 0.1 % Apply 1 application topically 2 (two) times daily.   venlafaxine (EFFEXOR) 50 MG tablet TAKE 1 TABLET BY MOUTH TWICE DAILY   Vibegron (GEMTESA) 75 MG TABS Take 1 capsule by mouth daily.   VISINE 0.05 % ophthalmic solution 1 drop  as needed   vitamin B-12 (CYANOCOBALAMIN) 100 MCG tablet Take 100 mcg by mouth daily.   VITAMIN D PO Take by mouth.   No current facility-administered medications on file prior  to visit.   Allergies  Allergen Reactions   Losartan Other (See Comments)    hyperkalemia   Penicillins Hives, Swelling and Rash    Has patient had a PCN reaction causing immediate rash, facial/tongue/throat swelling, SOB or lightheadedness with hypotension: YES Has patient had a PCN reaction causing severe rash involving mucus membranes or skin necrosis: NO Has patient had a PCN reaction that required hospitalization NO Has patient had a PCN reaction occurring within the last 10 years: NO If all of the above answers are "NO", then may proceed with Cephalosporin use.     Cefixime [Kdc:Cefixime] Other (See Comments)    unspecified   Gabapentin Other (See Comments)    Feels woozy   Indocin [Indomethacin] Other (See Comments)    Painful tongue and throat   Singulair [Montelukast Sodium]     Chest tightness, decreased mental clarity   Sulfa Antibiotics Other (See Comments)    unspecified   Ace Inhibitors Other (See Comments)    cough   Statins Other (See Comments)    Myalgias    Vitals:   10/29/20 1259  BP: 116/67  Pulse: 80  Height: 5' (1.524 m)  Weight: 127 lb (57.6 kg)  SpO2: 91%  BMI (Calculated): 24.8     Neurological Exam: MENTAL STATUS including orientation to time, place, person, recent and remote memory, attention span and concentration, language, and fund of knowledge is normal.  Speech is not dysarthric.  CRANIAL NERVES:  Pupils equal round and reactive to light.  Normal conjugate, extra-ocular eye movements in all directions of gaze.  No ptosis. Face is symmetric.   MOTOR:  Motor strength is 5/5 in all extremities, including distally.  There is mild loss of muscle bulk in the hands.  No fasciculations or abnormal movements.  No pronator drift.  Tone is normal.    MSRs:  Reflexes are 3+/4 throughout, except absent in the ankles.  SENSORY:  Vibration is reduced at the ankles bilaterally.  COORDINATION/GAIT:  Normal finger-to- nose-finger.  Intact rapid alternating movements bilaterally.  Gait narrow based and stable, stooped posture.    Data: NCS/EMG bilateral upper extremities 04/05/2019:   This is a normal study of the upper extremities.  In particular, there is no evidence of carpal tunnel syndrome, ulnar neuropathy, or cervical radiculopathy affecting the upper extremities.  Incidentally, there is evidence of bilateral [name]-Gruber anastomoses, a normal anatomic variant.  MRI cervical spine 12/03/2018: 1. Moderate bilateral foraminal stenosis at C5-6 which could affect either or both C6 nerves. 2. Slight compression of the  ventral aspect of the spinal cord at C4-5 and C5-6 without myelopathy.  IMPRESSION/PLAN: 1. Multilevel cervical canal spondylosis with foraminal stenosis and canal stenosis worse at C4-5 and C5-6.    - Start neck physiotherapy  - She is not interested in surgery at this time  2.  Idiopathic peripheral neuropathy manifesting with numbness and sensory ataxia, stable - Patient educated on daily foot inspection, fall prevention, and safety precautions around the home.  3.  Restless leg syndrome, stable. She manages symptoms with mustard.   Return to clinic in 6 months  Thank you for allowing me to participate in patient's care.  If I can answer any additional questions, I would be pleased to do so.    Sincerely,    Dona Klemann K. Posey Pronto, DO

## 2020-10-30 NOTE — Telephone Encounter (Signed)
FYI, spoke with pt and advised some of readings reported are same as report from last month and provider recommended no change. She felt dizzy this morning but is trying to take it easy.

## 2020-10-30 NOTE — Telephone Encounter (Signed)
Noted  

## 2020-10-30 NOTE — Telephone Encounter (Signed)
LM for pt to return call to discuss.  

## 2020-11-01 ENCOUNTER — Other Ambulatory Visit: Payer: Self-pay

## 2020-11-01 ENCOUNTER — Encounter: Payer: Self-pay | Admitting: Family Medicine

## 2020-11-01 ENCOUNTER — Ambulatory Visit (INDEPENDENT_AMBULATORY_CARE_PROVIDER_SITE_OTHER): Payer: Medicare Other | Admitting: Family Medicine

## 2020-11-01 VITALS — BP 134/71 | HR 63 | Temp 97.4°F | Ht 60.0 in | Wt 129.0 lb

## 2020-11-01 DIAGNOSIS — H8110 Benign paroxysmal vertigo, unspecified ear: Secondary | ICD-10-CM | POA: Diagnosis not present

## 2020-11-01 DIAGNOSIS — I952 Hypotension due to drugs: Secondary | ICD-10-CM

## 2020-11-01 DIAGNOSIS — I10 Essential (primary) hypertension: Secondary | ICD-10-CM

## 2020-11-01 NOTE — Progress Notes (Signed)
OFFICE VISIT  11/01/2020  CC:  Chief Complaint  Patient presents with   Blood Pressure Concern    Variation in blood pressure for the past month; some dizziness     HPI:    Patient is a 85 y.o. Caucasian female who presents for concern about her blood pressures. Having some BPPV sx's that area mild and short lived, not consistently occurring with every head turn but more often lately, also some similar vertigo feeling when standing up --but not consistent. No presyncope, no falls. bP earlier today with her home cuff was 89/67 and then 102/52 on recheck.  She says that on one of these checks the cuff was upside down. Home bp's 89-143/52-74-->seven measurements over the last month.  HR 60-70s. Not taking lasix lately b/c LE swelling minimal.  ROS as above, plus--> no fevers, no CP, no SOB, no wheezing, no cough, no HAs, no rashes, no melena/hematochezia.  No polyuria or polydipsia.  No myalgias or arthralgias.  No focal weakness, paresthesias, or tremors.  No dysuria or unusual/new urinary urgency or frequency.  No recent changes in lower legs. No n/v/d or abd pain.  No palpitations.     Past Medical History:  Diagnosis Date   Allergic rhinitis    with upper airway cough: as of 07/2017 pulm f/u she was instructed to use astelin, flonase, and saline more consistently   BPPV (benign paroxysmal positional vertigo) 03/2018   Chest pain, non-cardiac    Cardiac CT showed no signif obstructive dz, mildly elevated calcium level (Dr. [name]).   Chronic cystitis with hematuria    [name]   Chronic hypoxemic respiratory failure ([place]) 02/02/2015   2L oxygen 24/7   Chronic renal insufficiency, stage 2 (mild)    GFR approx 60   Cold hands    NCS/EMGs normal.  Hand dysesthesias per neuro--reassured   COPD (chronic obstructive pulmonary disease) ([place])    GOLD 4.  spirometry 01/10/09 FEV 0.97(52%), FEV1% 47.  With chronic bronchitis as of 07/2017.   DDD (degenerative disc disease),  cervical    1998 MRI C5-6 impingemt (left).  MRI 2021 (neurol) 1 level canal stenosis, 1 level foraminal stenosis--->PT   DDD (degenerative disc disease), lumbar    Depression with anxiety 04/15/2011   Diarrheal disease Summer 2017   ? 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).  As of 10/05/15 pt reports GI dx'd her with C diff and rx'd more flagyl and she is also on cholestyramine.  As of 02/2016, pt's sx's resolved completely.   Diverticulosis of colon    Procto '97 and colonoscopy 2002   Dysplastic nevus of upper extremity 04/2014   R tricep ([name])   Edema of both lower extremities due to peripheral venous insufficiency    Varicosities bilat, swelling L>R.  R baker's cyst.  Got vein clinic eval 02/2018->sclerotherapy recommended but since no hemorrhage or ulceration, insurance will not cover the procedure.   Family history of adverse reaction to anesthesia    mother died when patient age 24 receiving anesthesia for thyroid surgery   Fibrocystic breast disease    w/fibroadenoma.  Bx's showed NO atypia (Dr. Margot Chimes).  Mammo neg 2008.   GERD (gastroesophageal reflux disease)    History of double vision    Ophthalmologist, Dr. Herbert Deaner, is further evaluating this with MRI  orbits and limited brain (myesthenia gravis testing neg 07/2010)   History of home oxygen therapy  uses 2 liters at hs   Hypertension    EKG 03/2010 normal   Hypothyroidism    Hashimoto's, dx'd 1989   Idiopathic peripheral neuropathy 04/2018   Sensory (numb feet) S1 distribution bilat, c/w spinal stenosis sensory neuropathy (no pain). 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  Labs [place], MRI C spine->some spondylosis/stenosis.  UE NCS/EMS: normal 04/2019.   Lesion of right native kidney 2017   found on CT 02/2015.  F/u MRI 03/31/15 showed it to be smaller and  less likely of any significance but repeat CT renal protocol in 21mo was recommended by radiology.  This was done 10/26/15 and showed no interval change in the lesion, but b/c the lesion enhances with contrast, radiol rec'd urol consult (b/c pap renal RCC could not be excluded).  Saw urol 03/06/16--stable on u/s 09/2016 and 09/2017.   OAB (overactive bladder)    Osteoarthritis, hip, bilateral    she is s/p bilat THA as of 02/2018.   Osteoporosis    radius, T score -2.5->rec'd alendronate 08/2019->plan rpt DEXA 08/2021.   Pneumonia    Postmenopausal atrophic vaginitis    improved with estrace 2 X/week.   Prolapse of vaginal cuff after hysterectomy    10/2018: Dr. Wayne Both. Mild colpocele 04/2019 per GYN (no surgery)   Pulmonary nodule    CT chest 12/10, June 2011.  No nodule on CT 06/2010.   Recurrent Clostridium difficile diarrhea    Episode 09/19/2016: resolved with prolonged course of flagyl.  11/2016 recurrence treated with 10d of Dificid (Fidaxomicin) by GI (Dr. Earlean Shawl).   Recurrent UTI    likely related to pt's atrophic vaginitis.  Urol started vaginal estrace 03/2016.   Rib fracture 09/2018   R 7th, laterally-->sustained when her dog pulled her over and she fell.   RLS (restless legs syndrome)    neuro->gabapentin trial 2020/21   Shingles 02/2014   R abd/flank   Spinal stenosis of lumbar region with neurogenic claudication    2008 MRI (L3-4, L4-5, L5-S1).  +S1 distribution sensory loss bilat    Past Surgical History:  Procedure Laterality Date   ABDOMINAL HYSTERECTOMY  1978   no BSO per pt--nonmalignant reasons   APPENDECTOMY     BREAST CYST EXCISION     Last screening mammogram 10/2010 was normal.   CHOLECYSTECTOMY  2001   COLONOSCOPY  04/2005; 12/06/2015   2007 NORMAL.  2017 adenomatous polyp x 1.  Mod sigmoid diverticulosis (Dr. Earlean Shawl).     DEXA  05/18/2017; 08/7019   2019 NORMAL (T-score 0.1).  08/2019->T score -2.5->alendronate started->plan Rpt 08/2021.   HIP CLOSED REDUCTION Right  12/25/2015   Procedure: CLOSED REDUCTION HIP;  Surgeon: Nicholes Stairs, MD;  Location: [facility];  Service: Orthopedics;  Laterality: Right;   NCV/EMS  10/2018   chronic and symmetric sensorimotor axonal polyneuropathy affecting the lower extremities   TONSILLECTOMY     TOTAL HIP ARTHROPLASTY Right 11/01/2014   Procedure: RIGHT TOTAL HIP ARTHROPLASTY;  Surgeon: Latanya Maudlin, MD;  Location: [facility];  Service: Orthopedics;  Laterality: Right;   TOTAL HIP ARTHROPLASTY Left 12/08/2017   Procedure: LEFT TOTAL HIP ARTHROPLASTY ANTERIOR APPROACH;  Surgeon: Paralee Cancel, MD;  Location: [facility];  Service: Orthopedics;  Laterality: Left;  70 mins   TRANSTHORACIC ECHOCARDIOGRAM  08/2014   EF 60-65%, grade I diast dysfxn   TUBAL LIGATION  1974    Outpatient Medications Prior to Visit  Medication Sig Dispense Refill   albuterol (PROAIR HFA) 108 (90  Base) MCG/ACT inhaler Inhale 2 puffs into the lungs every 4 (four) hours as needed for wheezing or shortness of breath. 8.5 g 1   alendronate (FOSAMAX) 70 MG tablet TAKE 1 TABLET(70 MG) BY MOUTH 1 TIME A WEEK WITH A FULL GLASS OF WATER AND ON AN EMPTY STOMACH 12 tablet 3   amLODipine (NORVASC) 5 MG tablet TAKE 1 TABLET(5 MG) BY MOUTH DAILY 90 tablet 3   Artificial Tear Solution (SOOTHE XP) SOLN Place 1 drop into both eyes 2 (two) times daily as needed (irritation).     aspirin EC 81 MG tablet Take 81 mg by mouth daily.     azelastine (ASTELIN) 0.1 % nasal spray Place 2 sprays into both nostrils 2 (two) times daily. Use in each nostril as directed 30 mL 3   cetirizine (ZYRTEC) 10 MG tablet Take 1 tablet (10 mg total) by mouth daily. 30 tablet 6   clobetasol (TEMOVATE) 0.05 % external solution APPLY TO THE AFFECTED AREA EVERY DAY 50 mL 4   diazepam (VALIUM) 5 MG tablet TAKE 1 TABLET BY MOUTH DAILY AS NEEDED FOR ANXIETY AND INSOMNIA 30 tablet 5   fluorometholone (FML) 0.1 % ophthalmic suspension Place 1 drop into both eyes as directed. Tapering off      fluticasone (FLONASE) 50 MCG/ACT nasal spray Place 2 sprays into both nostrils daily. 16 g 6   Fluticasone-Umeclidin-Vilant (TRELEGY ELLIPTA) 100-62.5-25 MCG/INH AEPB Inhale 1 puff into the lungs daily. 60 each 6   furosemide (LASIX) 20 MG tablet TAKE 1 TABLET BY MOUTH EVERY DAY AS NEEDED FOR INCREASED FLUID COLLECTION IN LEGS 30 tablet 3   mirabegron ER (MYRBETRIQ) 25 MG TB24 tablet Take 1 tablet (25 mg total) by mouth daily. 30 tablet 3   nitrofurantoin (MACRODANTIN) 50 MG capsule Take 1 capsule (50 mg total) by mouth at bedtime. 90 capsule 3   Probiotic Product (PROBIOTIC DAILY PO) Take 1 capsule by mouth daily.      roflumilast (DALIRESP) 500 MCG TABS tablet Take 1 tablet (500 mcg total) by mouth daily. 90 tablet 3   SYNTHROID 88 MCG tablet TAKE 1 TABLET(88 MCG) BY MOUTH DAILY 30 tablet 1   triamcinolone cream (KENALOG) 0.1 % Apply 1 application topically 2 (two) times daily. 45 g 2   venlafaxine (EFFEXOR) 50 MG tablet TAKE 1 TABLET BY MOUTH TWICE DAILY 180 tablet 1   Vibegron (GEMTESA) 75 MG TABS Take 1 capsule by mouth daily. 30 tablet 0   VISINE 0.05 % ophthalmic solution 1 drop  as needed     vitamin B-12 (CYANOCOBALAMIN) 100 MCG tablet Take 100 mcg by mouth daily.     VITAMIN D PO Take by mouth.     No facility-administered medications prior to visit.    Allergies  Allergen Reactions   Losartan Other (See Comments)    hyperkalemia   Penicillins Hives, Swelling and Rash    Has patient had a PCN reaction causing immediate rash, facial/tongue/throat swelling, SOB or lightheadedness with hypotension: YES Has patient had a PCN reaction causing severe rash involving mucus membranes or skin necrosis: NO Has patient had a PCN reaction that required hospitalization NO Has patient had a PCN reaction occurring within the last 10 years: NO If all of the above answers are "NO", then may proceed with Cephalosporin use.    Cefixime [Kdc:Cefixime] Other (See Comments)    unspecified    Gabapentin Other (See Comments)    Feels woozy   Indocin [Indomethacin] Other (See Comments)  Painful tongue and throat   Singulair [Montelukast Sodium]     Chest tightness, decreased mental clarity   Sulfa Antibiotics Other (See Comments)    unspecified   Ace Inhibitors Other (See Comments)    cough   Statins Other (See Comments)    Myalgias     ROS As per HPI  PE: Vitals with BMI 11/01/2020 10/29/2020 10/01/2020  Height 5\' 0"  5\' 0"  5\' 0"   Weight 129 lbs 127 lbs 127 lbs 3 oz  BMI 25.19 76.2 83.15  Systolic 176 160 737  Diastolic 71 67 70  Pulse 63 80 64  Some encounter information is confidential and restricted. Go to Review Flowsheets activity to see all data.  O2 sat on RA today is 92%  Orthostatics today: supine 160/88, 58.  Upright 130/86, 64.  Standing 140/80, 62. Gen: Alert, well appearing.  Patient is oriented to person, place, time, and situation. AFFECT: pleasant, lucid thought and speech. CV: RRR, no m/r/g.   LUNGS: CTA bilat, nonlabored resps, good aeration in all lung fields. EXT: no clubbing or cyanosis.  1+ bilat LL pitting edema.    LABS:    Chemistry      Component Value Date/Time   NA 140 09/07/2020 1444   K 4.0 09/07/2020 1444   CL 107 09/07/2020 1444   CO2 23 09/07/2020 1444   BUN 18 09/07/2020 1444   CREATININE 1.04 (H) 09/07/2020 1444      Component Value Date/Time   CALCIUM 8.9 09/07/2020 1444   ALKPHOS 105 05/09/2020 1347   AST 16 05/09/2020 1347   ALT 13 05/09/2020 1347   BILITOT 0.5 05/09/2020 1347     Lab Results  Component Value Date   WBC 6.2 05/09/2020   HGB 14.5 05/09/2020   HCT 44.0 05/09/2020   MCV 89.8 05/09/2020   PLT 255.0 05/09/2020   IMPRESSION AND PLAN:  1) HTN; she has fluctuations but overall normal average and I really don't think her bp is the cause of or the result of her recent recurrence of bppv. Encouraged good hydration. Cont periodic home bp monitoring.  2) BPPV: sx's mild at this point. We reviewed  home epley maneuvers again--she had not been doing these correctly.  An After Visit Summary was printed and given to the patient.  Spent 30 min with pt today reviewing HPI, reviewing relevant past history, doing exam, reviewing and discussing lab and imaging data, and formulating plans.  FOLLOW UP: Return for keep appt already set for 12/07/20.  Signed:  Crissie Sickles, MD           11/01/2020

## 2020-11-11 ENCOUNTER — Other Ambulatory Visit: Payer: Self-pay | Admitting: Family Medicine

## 2020-11-12 ENCOUNTER — Other Ambulatory Visit: Payer: Self-pay | Admitting: Family Medicine

## 2020-11-12 ENCOUNTER — Ambulatory Visit: Payer: Medicare Other | Admitting: Neurology

## 2020-11-12 ENCOUNTER — Other Ambulatory Visit: Payer: Self-pay | Admitting: Pulmonary Disease

## 2020-11-12 DIAGNOSIS — J449 Chronic obstructive pulmonary disease, unspecified: Secondary | ICD-10-CM

## 2020-11-12 DIAGNOSIS — J4489 Other specified chronic obstructive pulmonary disease: Secondary | ICD-10-CM

## 2020-11-12 DIAGNOSIS — J9611 Chronic respiratory failure with hypoxia: Secondary | ICD-10-CM

## 2020-11-12 MED ORDER — TRELEGY ELLIPTA 100-62.5-25 MCG/INH IN AEPB
1.0000 | INHALATION_SPRAY | Freq: Every day | RESPIRATORY_TRACT | 6 refills | Status: DC
Start: 2020-11-12 — End: 2021-04-02

## 2020-11-14 DIAGNOSIS — G6289 Other specified polyneuropathies: Secondary | ICD-10-CM | POA: Diagnosis not present

## 2020-11-14 DIAGNOSIS — M4712 Other spondylosis with myelopathy, cervical region: Secondary | ICD-10-CM | POA: Diagnosis not present

## 2020-11-15 ENCOUNTER — Telehealth: Payer: Self-pay | Admitting: Pharmacy Technician

## 2020-11-15 ENCOUNTER — Telehealth: Payer: Self-pay | Admitting: Pulmonary Disease

## 2020-11-15 DIAGNOSIS — Z596 Low income: Secondary | ICD-10-CM

## 2020-11-15 NOTE — Progress Notes (Signed)
[place])                                            [place] Team    11/15/2020  LILLY GASSER Oct 13, 1935 [id number]   Received both patient and provider portion(s) of patient assistance application(s) for Trelegy. Faxed completed application and required documents into [employer].   Tenishia Ekman P. Darrell Leonhardt, [place]  [phone]

## 2020-11-15 NOTE — Telephone Encounter (Signed)
Spoke with the pt  She states [employer] needing information regarding o2 needs   Per last ov 10/01/20-   Her SpO2 dropped to 87% on room with ambulatory oximetry.  She was recovered on 2 liters oxygen to 96%.  Will call [employer] 11/15/20

## 2020-11-16 NOTE — Telephone Encounter (Signed)
I have attempted to call [employer] but have been on hold for over 10 mins and no one has ever answered the phone.   Will try back later.

## 2020-11-19 ENCOUNTER — Telehealth: Payer: Self-pay | Admitting: Pulmonary Disease

## 2020-11-19 ENCOUNTER — Telehealth: Payer: Self-pay | Admitting: Pharmacy Technician

## 2020-11-19 DIAGNOSIS — M4712 Other spondylosis with myelopathy, cervical region: Secondary | ICD-10-CM | POA: Diagnosis not present

## 2020-11-19 DIAGNOSIS — Z596 Low income: Secondary | ICD-10-CM

## 2020-11-19 DIAGNOSIS — G6289 Other specified polyneuropathies: Secondary | ICD-10-CM | POA: Diagnosis not present

## 2020-11-19 NOTE — Progress Notes (Addendum)
[place] [facility])                                            [place] Team    11/19/2020  [name] 05-10-1935 [id number]  Care coordination call placed to [place] in regards to Trelegy application.  Spoke to [place] who informed patient was APPROVED 11/16/20-02/02/21. She informed medication would be shipped to her home in the next 7-10 business days.  [name] P. Cleatis Fandrich, [place]  [phone]

## 2020-11-19 NOTE — Telephone Encounter (Signed)
I have attempted to call the pt but there was no answer and no VM.  Will try back later.  

## 2020-11-20 MED ORDER — TRELEGY ELLIPTA 100-62.5-25 MCG/INH IN AEPB
1.0000 | INHALATION_SPRAY | Freq: Every day | RESPIRATORY_TRACT | 0 refills | Status: DC
Start: 2020-11-20 — End: 2021-04-02

## 2020-11-20 NOTE — Telephone Encounter (Signed)
Attempted to call pt on home number but line just rang and rang. Called pt on mobile number and was able to reach her. Stated that I was going to put a sample up front for her that she could come by to  pick up and she verbalized understanding. Nothing further needed.

## 2020-11-20 NOTE — Telephone Encounter (Signed)
Called the local [employer] office and was advised that we needed to call [phone] and speak with Glendell Docker. Called the number provided and spoke with [place]. She stated that they were needing the most recent OV and walk information. Patient was seen back in August 2022. Will print the OV and walk info and fax to [phone].   Nothing further needed at time of call.

## 2020-11-21 ENCOUNTER — Other Ambulatory Visit: Payer: Self-pay

## 2020-11-21 DIAGNOSIS — M4712 Other spondylosis with myelopathy, cervical region: Secondary | ICD-10-CM | POA: Diagnosis not present

## 2020-11-21 DIAGNOSIS — G6289 Other specified polyneuropathies: Secondary | ICD-10-CM | POA: Diagnosis not present

## 2020-11-21 MED ORDER — DIAZEPAM 5 MG PO TABS: ORAL_TABLET | ORAL | 5 refills | Status: DC

## 2020-11-21 NOTE — Telephone Encounter (Signed)
Requesting: diazepam Contract: N/A UDS:N/A Last Visit:11/01/20 Next Visit:12/07/20 Last Refill:05/11/20(30,5)  Please Advise. Medication pending

## 2020-11-23 ENCOUNTER — Encounter: Payer: Self-pay | Admitting: Urology

## 2020-11-23 ENCOUNTER — Other Ambulatory Visit: Payer: Self-pay

## 2020-11-23 ENCOUNTER — Ambulatory Visit (INDEPENDENT_AMBULATORY_CARE_PROVIDER_SITE_OTHER): Payer: Medicare Other | Admitting: Urology

## 2020-11-23 VITALS — BP 131/77 | HR 71

## 2020-11-23 DIAGNOSIS — N3021 Other chronic cystitis with hematuria: Secondary | ICD-10-CM | POA: Diagnosis not present

## 2020-11-23 DIAGNOSIS — R197 Diarrhea, unspecified: Secondary | ICD-10-CM | POA: Diagnosis not present

## 2020-11-23 DIAGNOSIS — R3915 Urgency of urination: Secondary | ICD-10-CM

## 2020-11-23 LAB — URINALYSIS, ROUTINE W REFLEX MICROSCOPIC
Bilirubin, UA: NEGATIVE
Glucose, UA: NEGATIVE
Ketones, UA: NEGATIVE
Nitrite, UA: NEGATIVE
Protein,UA: NEGATIVE
RBC, UA: NEGATIVE
Specific Gravity, UA: 1.015 (ref 1.005–1.030)
Urobilinogen, Ur: 0.2 mg/dL (ref 0.2–1.0)
pH, UA: 7 (ref 5.0–7.5)

## 2020-11-23 LAB — MICROSCOPIC EXAMINATION
RBC, Urine: NONE SEEN /hpf (ref 0–2)
Renal Epithel, UA: NONE SEEN /hpf

## 2020-11-23 MED ORDER — NITROFURANTOIN MACROCRYSTAL 50 MG PO CAPS: 50.0000 mg | ORAL_CAPSULE | Freq: Every day | ORAL | 3 refills | Status: DC

## 2020-11-23 MED ORDER — GEMTESA 75 MG PO TABS: 1.0000 | ORAL_TABLET | Freq: Every day | ORAL | 11 refills | Status: DC

## 2020-11-23 NOTE — Progress Notes (Signed)
Urological Symptom Review  Patient is experiencing the following symptoms: Get up at night to urinate   Review of Systems  Gastrointestinal (upper)  : Negative for upper GI symptoms  Gastrointestinal (lower) : Negative for lower GI symptoms  Constitutional : Negative for symptoms  Skin: Itching  Eyes: Double vision  Ear/Nose/Throat : Negative for Ear/Nose/Throat symptoms  Hematologic/Lymphatic: Easy bruising  Cardiovascular : Negative for cardiovascular symptoms  Respiratory : Negative for respiratory symptoms  Endocrine: Negative for endocrine symptoms  Musculoskeletal: Negative for musculoskeletal symptoms  Neurological: Negative for neurological symptoms  Psychologic: Negative for psychiatric symptoms

## 2020-11-23 NOTE — Progress Notes (Signed)
11/23/2020 11:27 AM   Priscilla Cantrell 09-27-1935 [id number]  Referring provider: [name], MD [address] [place],  [place] [address]  Followup urinary urgency and recurrent UTI   HPI: Priscilla Cantrell is a 85yo here for followup for urinary urgency and recurrent UTI. No UTIs since last visit. She is on macrobid 50mg  qhs. She uses a pad on occasion for mild urinary dribbling. She is on gemtesa 75mg  which has significantly improved her urinary urgency and urge incontinence. No other complaints today   PMH: Past Medical History:  Diagnosis Date   Allergic rhinitis    with upper airway cough: as of 07/2017 pulm f/u she was instructed to use astelin, flonase, and saline more consistently   BPPV (benign paroxysmal positional vertigo) 03/2018   Chest pain, non-cardiac    Cardiac CT showed no signif obstructive dz, mildly elevated calcium level (Dr. [name]).   Chronic cystitis with hematuria    [name]   Chronic hypoxemic respiratory failure ([place]) 02/02/2015   2L oxygen 24/7   Chronic renal insufficiency, stage 2 (mild)    GFR approx 60   Cold hands    NCS/EMGs normal.  Hand dysesthesias per neuro--reassured   COPD (chronic obstructive pulmonary disease) ([place])    GOLD 4.  spirometry 01/10/09 FEV 0.97(52%), FEV1% 47.  With chronic bronchitis as of 07/2017.   DDD (degenerative disc disease), cervical    1998 MRI C5-6 impingemt (left).  MRI 2021 (neurol) 1 level canal stenosis, 1 level foraminal stenosis--->PT   DDD (degenerative disc disease), lumbar    Depression with anxiety 04/15/2011   Diarrheal disease Summer 2017   ? 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).  As of 10/05/15 pt reports GI dx'd her with C diff and rx'd more flagyl and she is also on cholestyramine.  As of 02/2016, pt's sx's resolved completely.   Diverticulosis of colon    Procto '97 and  colonoscopy 2002   Dysplastic nevus of upper extremity 04/2014   R tricep ([name])   Edema of both lower extremities due to peripheral venous insufficiency    Varicosities bilat, swelling L>R.  R baker's cyst.  Got vein clinic eval 02/2018->sclerotherapy recommended but since no hemorrhage or ulceration, insurance will not cover the procedure.   Family history of adverse reaction to anesthesia    mother died when patient age 62 receiving anesthesia for thyroid surgery   Fibrocystic breast disease    w/fibroadenoma.  Bx's showed NO atypia (Dr. Margot Chimes).  Mammo neg 2008.   GERD (gastroesophageal reflux disease)    History of double vision    Ophthalmologist, Dr. Herbert Deaner, is further evaluating this with MRI  orbits and limited brain (myesthenia gravis testing neg 07/2010)   History of home oxygen therapy    uses 2 liters at hs   Hypertension    EKG 03/2010 normal   Hypothyroidism    Hashimoto's, dx'd 1989   Idiopathic peripheral neuropathy 04/2018   Sensory (numb feet) S1 distribution bilat, c/w spinal stenosis sensory neuropathy (no pain). 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  Labs [place], MRI C spine->some spondylosis/stenosis.  UE NCS/EMS: normal 04/2019.   Lesion of right native kidney 2017   found on CT 02/2015.  F/u MRI 03/31/15 showed it to be smaller and less likely of any significance but repeat CT renal protocol in 11mo was recommended by  radiology.  This was done 10/26/15 and showed no interval change in the lesion, but b/c the lesion enhances with contrast, radiol rec'd urol consult (b/c pap renal RCC could not be excluded).  Saw urol 03/06/16--stable on u/s 09/2016 and 09/2017.   OAB (overactive bladder)    Osteoarthritis, hip, bilateral    she is s/p bilat THA as of 02/2018.   Osteoporosis    radius, T score -2.5->rec'd alendronate 08/2019->plan rpt DEXA 08/2021.   Pneumonia    Postmenopausal atrophic vaginitis    improved  with estrace 2 X/week.   Prolapse of vaginal cuff after hysterectomy    10/2018: Dr. Wayne Both. Mild colpocele 04/2019 per GYN (no surgery)   Pulmonary nodule    CT chest 12/10, June 2011.  No nodule on CT 06/2010.   Recurrent Clostridium difficile diarrhea    Episode 09/19/2016: resolved with prolonged course of flagyl.  11/2016 recurrence treated with 10d of Dificid (Fidaxomicin) by GI (Dr. Earlean Shawl).   Recurrent UTI    likely related to pt's atrophic vaginitis.  Urol started vaginal estrace 03/2016.   Rib fracture 09/2018   R 7th, laterally-->sustained when her dog pulled her over and she fell.   RLS (restless legs syndrome)    neuro->gabapentin trial 2020/21   Shingles 02/2014   R abd/flank   Spinal stenosis of lumbar region with neurogenic claudication    2008 MRI (L3-4, L4-5, L5-S1).  +S1 distribution sensory loss bilat    Surgical History: Past Surgical History:  Procedure Laterality Date   ABDOMINAL HYSTERECTOMY  1978   no BSO per pt--nonmalignant reasons   APPENDECTOMY     BREAST CYST EXCISION     Last screening mammogram 10/2010 was normal.   CHOLECYSTECTOMY  2001   COLONOSCOPY  04/2005; 12/06/2015   2007 NORMAL.  2017 adenomatous polyp x 1.  Mod sigmoid diverticulosis (Dr. Earlean Shawl).     DEXA  05/18/2017; 08/7019   2019 NORMAL (T-score 0.1).  08/2019->T score -2.5->alendronate started->plan Rpt 08/2021.   HIP CLOSED REDUCTION Right 12/25/2015   Procedure: CLOSED REDUCTION HIP;  Surgeon: Nicholes Stairs, MD;  Location: [facility];  Service: Orthopedics;  Laterality: Right;   NCV/EMS  10/2018   chronic and symmetric sensorimotor axonal polyneuropathy affecting the lower extremities   TONSILLECTOMY     TOTAL HIP ARTHROPLASTY Right 11/01/2014   Procedure: RIGHT TOTAL HIP ARTHROPLASTY;  Surgeon: Latanya Maudlin, MD;  Location: [facility];  Service: Orthopedics;  Laterality: Right;   TOTAL HIP ARTHROPLASTY Left 12/08/2017   Procedure: LEFT TOTAL HIP ARTHROPLASTY ANTERIOR APPROACH;  Surgeon:  Paralee Cancel, MD;  Location: [facility];  Service: Orthopedics;  Laterality: Left;  70 mins   TRANSTHORACIC ECHOCARDIOGRAM  08/2014   EF 60-65%, grade I diast dysfxn   TUBAL LIGATION  1974    Home Medications:  Allergies as of 11/23/2020       Reactions   Losartan Other (See Comments)   hyperkalemia   Penicillins Hives, Swelling, Rash   Has patient had a PCN reaction causing immediate rash, facial/tongue/throat swelling, SOB or lightheadedness with hypotension: YES Has patient had a PCN reaction causing severe rash involving mucus membranes or skin necrosis: NO Has patient had a PCN reaction that required hospitalization NO Has patient had a PCN reaction occurring within the last 10 years: NO If all of the above answers are "NO", then may proceed with Cephalosporin use.   Cefixime [kdc:cefixime] Other (See Comments)   unspecified   Gabapentin Other (See Comments)   Feels woozy  Indocin [indomethacin] Other (See Comments)   Painful tongue and throat   Singulair [montelukast Sodium]    Chest tightness, decreased mental clarity   Sulfa Antibiotics Other (See Comments)   unspecified   Ace Inhibitors Other (See Comments)   cough   Statins Other (See Comments)   Myalgias        Medication List        Accurate as of November 23, 2020 11:27 AM. If you have any questions, ask your nurse or doctor.          albuterol 108 (90 Base) MCG/ACT inhaler Commonly known as: ProAir HFA Inhale 2 puffs into the lungs every 4 (four) hours as needed for wheezing or shortness of breath.   alendronate 70 MG tablet Commonly known as: FOSAMAX TAKE 1 TABLET(70 MG) BY MOUTH 1 TIME A WEEK WITH A FULL GLASS OF WATER AND ON AN EMPTY STOMACH   amLODipine 5 MG tablet Commonly known as: NORVASC TAKE 1 TABLET(5 MG) BY MOUTH DAILY   aspirin EC 81 MG tablet Take 81 mg by mouth daily.   azelastine 0.1 % nasal spray Commonly known as: ASTELIN Place 2 sprays into both nostrils 2 (two) times daily.  Use in each nostril as directed   cetirizine 10 MG tablet Commonly known as: ZYRTEC Take 1 tablet (10 mg total) by mouth daily.   clobetasol 0.05 % external solution Commonly known as: TEMOVATE APPLY TO THE AFFECTED AREA EVERY DAY   diazepam 5 MG tablet Commonly known as: VALIUM TAKE 1 TABLET BY MOUTH DAILY AS NEEDED FOR ANXIETY AND INSOMNIA   fluorometholone 0.1 % ophthalmic suspension Commonly known as: FML Place 1 drop into both eyes as directed. Tapering off   fluticasone 50 MCG/ACT nasal spray Commonly known as: FLONASE Place 2 sprays into both nostrils daily.   furosemide 20 MG tablet Commonly known as: LASIX TAKE 1 TABLET BY MOUTH EVERY DAY AS NEEDED FOR INCREASED FLUID COLLECTION IN LEGS   Gemtesa 75 MG Tabs Generic drug: Vibegron Take 1 capsule by mouth daily.   mirabegron ER 25 MG Tb24 tablet Commonly known as: MYRBETRIQ Take 1 tablet (25 mg total) by mouth daily.   nitrofurantoin 50 MG capsule Commonly known as: MACRODANTIN Take 1 capsule (50 mg total) by mouth at bedtime.   PROBIOTIC DAILY PO Take 1 capsule by mouth daily.   roflumilast 500 MCG Tabs tablet Commonly known as: DALIRESP Take 1 tablet (500 mcg total) by mouth daily.   Soothe XP Soln Place 1 drop into both eyes 2 (two) times daily as needed (irritation).   Synthroid 88 MCG tablet Generic drug: levothyroxine TAKE 1 TABLET(88 MCG) BY MOUTH DAILY   Trelegy Ellipta 100-62.5-25 MCG/ACT Aepb Generic drug: Fluticasone-Umeclidin-Vilant Inhale 1 puff into the lungs daily.   Trelegy Ellipta 100-62.5-25 MCG/ACT Aepb Generic drug: Fluticasone-Umeclidin-Vilant Inhale 1 puff into the lungs daily.   triamcinolone cream 0.1 % Commonly known as: KENALOG Apply 1 application topically 2 (two) times daily.   venlafaxine 50 MG tablet Commonly known as: EFFEXOR TAKE 1 TABLET BY MOUTH TWICE DAILY   Visine 0.05 % ophthalmic solution Generic drug: tetrahydrozoline 1 drop  as needed   vitamin B-12  100 MCG tablet Commonly known as: CYANOCOBALAMIN Take 100 mcg by mouth daily.   VITAMIN D PO Take by mouth.        Allergies:  Allergies  Allergen Reactions   Losartan Other (See Comments)    hyperkalemia   Penicillins Hives, Swelling and Rash  Has patient had a PCN reaction causing immediate rash, facial/tongue/throat swelling, SOB or lightheadedness with hypotension: YES Has patient had a PCN reaction causing severe rash involving mucus membranes or skin necrosis: NO Has patient had a PCN reaction that required hospitalization NO Has patient had a PCN reaction occurring within the last 10 years: NO If all of the above answers are "NO", then may proceed with Cephalosporin use.    Cefixime [Kdc:Cefixime] Other (See Comments)    unspecified   Gabapentin Other (See Comments)    Feels woozy   Indocin [Indomethacin] Other (See Comments)    Painful tongue and throat   Singulair [Montelukast Sodium]     Chest tightness, decreased mental clarity   Sulfa Antibiotics Other (See Comments)    unspecified   Ace Inhibitors Other (See Comments)    cough   Statins Other (See Comments)    Myalgias     Family History: Family History  Problem Relation Age of Onset   Goiter Mother    Psoriasis Father        od liver   Cirrhosis Father    Diabetes Daughter        Type 2   Stroke Maternal Grandfather    Stroke Paternal Grandmother    Hypertension Paternal Grandmother    Hernia Paternal Grandmother    Cancer Paternal Grandfather        lung   Cancer Maternal Aunt        cancer of the stomach    Social History:  reports that she quit smoking about 30 years ago. Her smoking use included cigarettes. She started smoking about 64 years ago. She has a 51.00 pack-year smoking history. She has never used smokeless tobacco. She reports current alcohol use. She reports that she does not use drugs.  ROS: All other review of systems were reviewed and are negative except what is noted  above in HPI  Physical Exam: BP 131/77   Pulse 71   Constitutional:  Alert and oriented, No acute distress. HEENT: [place] AT, moist mucus membranes.  Trachea midline, no masses. Cardiovascular: No clubbing, cyanosis, or edema. Respiratory: Normal respiratory effort, no increased work of breathing. GI: Abdomen is soft, nontender, nondistended, no abdominal masses GU: No CVA tenderness.  Lymph: No cervical or inguinal lymphadenopathy. Skin: No rashes, bruises or suspicious lesions. Neurologic: Grossly intact, no focal deficits, moving all 4 extremities. Psychiatric: Normal mood and affect.  Laboratory Data: Lab Results  Component Value Date   WBC 6.2 05/09/2020   HGB 14.5 05/09/2020   HCT 44.0 05/09/2020   MCV 89.8 05/09/2020   PLT 255.0 05/09/2020    Lab Results  Component Value Date   CREATININE 1.04 (H) 09/07/2020    No results found for: PSA  No results found for: TESTOSTERONE  Lab Results  Component Value Date   HGBA1C 5.7 10/29/2011    Urinalysis    Component Value Date/Time   COLORURINE YELLOW 02/12/2020 1013   APPEARANCEUR Clear 05/25/2020 1141   LABSPEC 1.020 02/12/2020 1013   PHURINE 6.5 02/12/2020 1013   GLUCOSEU Negative 05/25/2020 1141   GLUCOSEU NEGATIVE 06/14/2015 1452   HGBUR LARGE (A) 02/12/2020 1013   BILIRUBINUR Negative 05/25/2020 1141   KETONESUR NEGATIVE 02/12/2020 1013   PROTEINUR Trace (A) 05/25/2020 1141   PROTEINUR >300 (A) 02/12/2020 1013   UROBILINOGEN 0.2 09/01/2018 1147   UROBILINOGEN 0.2 06/14/2015 1452   NITRITE Negative 05/25/2020 1141   NITRITE NEGATIVE 02/12/2020 1013   LEUKOCYTESUR 1+ (A) 05/25/2020  [place] (A) 02/12/2020 1013    Lab Results  Component Value Date   LABMICR See below: 05/25/2020   WBCUA 6-10 (A) 05/25/2020   LABEPIT 0-10 05/25/2020   MUCUS Present 05/25/2020   BACTERIA Moderate (A) 05/25/2020    Pertinent Imaging:  Results for orders placed during the hospital encounter of  06/06/15  DG Abd 1 View  Narrative CLINICAL DATA:  Anal leakage that she states is not diarhhea x 2 weeks. Pt denies pain or nausea or vomiting. She states she had shingles a couple of months ago but finished up her antibiotics x 1 month ago.  EXAM: ABDOMEN - 1 VIEW  COMPARISON:  None.  FINDINGS: There is no bowel dilatation to suggest obstruction. There is no evidence of pneumoperitoneum, portal venous gas or pneumatosis. There are no pathologic calcifications along the expected course of the ureters. There is a right total hip arthroplasty. There is moderate osteoarthritis of the left hip. There is lumbar spine spondylosis.  IMPRESSION: Unremarkable KUB.   Electronically Signed By: Kathreen Devoid On: 06/06/2015 16:07  Results for orders placed during the hospital encounter of 09/02/17  US Venous Img Lower Bilateral  Narrative CLINICAL DATA:  Bilateral lower extremity pain and edema. History of chronic mild venous insufficiency. History of varicose veins. Evaluate for DVT.  EXAM: BILATERAL LOWER EXTREMITY VENOUS DOPPLER ULTRASOUND  TECHNIQUE: Gray-scale sonography with graded compression, as well as color Doppler and duplex ultrasound were performed to evaluate the lower extremity deep venous systems from the level of the common femoral vein and including the common femoral, femoral, profunda femoral, popliteal and calf veins including the posterior tibial, peroneal and gastrocnemius veins when visible. The superficial great saphenous vein was also interrogated. Spectral Doppler was utilized to evaluate flow at rest and with distal augmentation maneuvers in the common femoral, femoral and popliteal veins.  COMPARISON:  None.  FINDINGS: RIGHT LOWER EXTREMITY  Common Femoral Vein: No evidence of thrombus. Normal compressibility, respiratory phasicity and response to augmentation.  Saphenofemoral Junction: No evidence of thrombus. Normal compressibility and  flow on color Doppler imaging.  Profunda Femoral Vein: No evidence of thrombus. Normal compressibility and flow on color Doppler imaging.  Femoral Vein: No evidence of thrombus. Normal compressibility, respiratory phasicity and response to augmentation.  Popliteal Vein: No evidence of thrombus. Normal compressibility, respiratory phasicity and response to augmentation.  Calf Veins: No evidence of thrombus. Normal compressibility and flow on color Doppler imaging.  Superficial Great Saphenous Vein: No evidence of thrombus. Normal compressibility.  Venous Reflux:  None.  Other Findings: There is an approximately 3.8 x 1.6 x 3.3 cm anechoic serpiginous fluid collection with the right popliteal fossa compatible with a Baker cyst.  LEFT LOWER EXTREMITY  Common Femoral Vein: No evidence of thrombus. Normal compressibility, respiratory phasicity and response to augmentation.  Saphenofemoral Junction: No evidence of thrombus. Normal compressibility and flow on color Doppler imaging.  Profunda Femoral Vein: No evidence of thrombus. Normal compressibility and flow on color Doppler imaging.  Femoral Vein: No evidence of thrombus. Normal compressibility, respiratory phasicity and response to augmentation.  Popliteal Vein: No evidence of thrombus. Normal compressibility, respiratory phasicity and response to augmentation.  Calf Veins: No evidence of thrombus. Normal compressibility and flow on color Doppler imaging.  Superficial Great Saphenous Vein: No evidence of thrombus. Normal compressibility.  Venous Reflux:  None.  Other Findings:  None.  IMPRESSION: 1. No evidence of DVT within either lower extremity. 2. Incidentally noted 3.8 cm right-sided  Baker's cyst.   Electronically Signed By: Sandi Mariscal M.D. On: 09/02/2017 15:46  No results found for this or any previous visit.  No results found for this or any previous visit.  No results found for this or any  previous visit.  No results found for this or any previous visit.  No results found for this or any previous visit.  No results found for this or any previous visit.   Assessment & Plan:    1. Chronic cystitis with hematuria -Continue macrobid 50mg  qhs - Urinalysis, Routine w reflex microscopic  2. Urinary urgency -Continue Gemtesa 75mg   daily   No follow-ups on file.  Nicolette Bang, MD  [facility] [place]

## 2020-11-23 NOTE — Patient Instructions (Signed)
Overactive Bladder, Adult °Overactive bladder is a condition in which a person has a sudden and frequent need to urinate. A person might also leak urine if he or she cannot get to the bathroom fast enough (urinary incontinence). Sometimes, symptoms can interfere with work or social activities. °What are the causes? °Overactive bladder is associated with poor nerve signals between your bladder and your brain. Your bladder may get the signal to empty before it is full. You may also have very sensitive muscles that make your bladder squeeze too soon. This condition may also be caused by other factors, such as: °Medical conditions: °Urinary tract infection. °Infection of nearby tissues. °Prostate enlargement. °Bladder stones, inflammation, or tumors. °Diabetes. °Muscle or nerve weakness, especially from these conditions: °A spinal cord injury. °Stroke. °Multiple sclerosis. °Parkinson's disease. °Other causes: °Surgery on the uterus or urethra. °Drinking too much caffeine or alcohol. °Certain medicines, especially those that eliminate extra fluid in the body (diuretics). °Constipation. °What increases the risk? °You may be at greater risk for overactive bladder if you: °Are an older adult. °Smoke. °Are going through menopause. °Have prostate problems. °Have a neurological disease, such as stroke, dementia, Parkinson's disease, or multiple sclerosis (MS). °Eat or drink alcohol, spicy food, caffeine, and other things that irritate the bladder. °Are overweight or obese. °What are the signs or symptoms? °Symptoms of this condition include a sudden, strong urge to urinate. Other symptoms include: °Leaking urine. °Urinating 8 or more times a day. °Waking up to urinate 2 or more times overnight. °How is this diagnosed? °This condition may be diagnosed based on: °Your symptoms and medical history. °A physical exam. °Blood or urine tests to check for possible causes, such as infection. °You may also need to see a health care  provider who specializes in urinary tract problems. This is called a urologist. °How is this treated? °Treatment for overactive bladder depends on the cause of your condition and whether it is mild or severe. Treatment may include: °Bladder training, such as: °Learning to control the urge to urinate by following a schedule to urinate at regular intervals. °Doing Kegel exercises to strengthen the pelvic floor muscles that support your bladder. °Special devices, such as: °Biofeedback. This uses sensors to help you become aware of your body's signals. °Electrical stimulation. This uses electrodes placed inside the body (implanted) or outside the body. These electrodes send gentle pulses of electricity to strengthen the nerves or muscles that control the bladder. °Women may use a plastic device, called a pessary, that fits into the vagina and supports the bladder. °Medicines, such as: °Antibiotics to treat bladder infection. °Antispasmodics to stop the bladder from releasing urine at the wrong time. °Tricyclic antidepressants to relax bladder muscles. °Injections of botulinum toxin type A directly into the bladder tissue to relax bladder muscles. °Surgery, such as: °A device may be implanted to help manage the nerve signals that control urination. °An electrode may be implanted to stimulate electrical signals in the bladder. °A procedure may be done to change the shape of the bladder. This is done only in very severe cases. °Follow these instructions at home: °Eating and drinking ° °Make diet or lifestyle changes recommended by your health care provider. These may include: °Drinking fluids throughout the day and not only with meals. °Cutting down on caffeine or alcohol. °Eating a healthy and balanced diet to prevent constipation. This may include: °Choosing foods that are high in fiber, such as beans, whole grains, and fresh fruits and vegetables. °Limiting foods that are   high in fat and processed sugars, such as fried  and sweet foods. °Lifestyle ° °Lose weight if needed. °Do not use any products that contain nicotine or tobacco. These include cigarettes, chewing tobacco, and vaping devices, such as e-cigarettes. If you need help quitting, ask your health care provider. °General instructions °Take over-the-counter and prescription medicines only as told by your health care provider. °If you were prescribed an antibiotic medicine, take it as told by your health care provider. Do not stop taking the antibiotic even if you start to feel better. °Use any implants or pessary as told by your health care provider. °If needed, wear pads to absorb urine leakage. °Keep a log to track how much and when you drink, and when you need to urinate. This will help your health care provider monitor your condition. °Keep all follow-up visits. This is important. °Contact a health care provider if: °You have a fever or chills. °Your symptoms do not get better with treatment. °Your pain and discomfort get worse. °You have more frequent urges to urinate. °Get help right away if: °You are not able to control your bladder. °Summary °Overactive bladder refers to a condition in which a person has a sudden and frequent need to urinate. °Several conditions may lead to an overactive bladder. °Treatment for overactive bladder depends on the cause and severity of your condition. °Making lifestyle changes, doing Kegel exercises, keeping a log, and taking medicines can help with this condition. °This information is not intended to replace advice given to you by your health care provider. Make sure you discuss any questions you have with your health care provider. °Document Revised: 10/10/2019 Document Reviewed: 10/10/2019 °[employer] Patient Education © 2022 [employer] ° °

## 2020-11-26 ENCOUNTER — Ambulatory Visit: Payer: Self-pay | Admitting: *Deleted

## 2020-11-28 DIAGNOSIS — G6289 Other specified polyneuropathies: Secondary | ICD-10-CM | POA: Diagnosis not present

## 2020-11-28 DIAGNOSIS — M4712 Other spondylosis with myelopathy, cervical region: Secondary | ICD-10-CM | POA: Diagnosis not present

## 2020-11-30 DIAGNOSIS — G6289 Other specified polyneuropathies: Secondary | ICD-10-CM | POA: Diagnosis not present

## 2020-11-30 DIAGNOSIS — M4712 Other spondylosis with myelopathy, cervical region: Secondary | ICD-10-CM | POA: Diagnosis not present

## 2020-12-03 DIAGNOSIS — M4712 Other spondylosis with myelopathy, cervical region: Secondary | ICD-10-CM | POA: Diagnosis not present

## 2020-12-03 DIAGNOSIS — G6289 Other specified polyneuropathies: Secondary | ICD-10-CM | POA: Diagnosis not present

## 2020-12-07 ENCOUNTER — Other Ambulatory Visit: Payer: Self-pay

## 2020-12-07 ENCOUNTER — Ambulatory Visit (INDEPENDENT_AMBULATORY_CARE_PROVIDER_SITE_OTHER): Payer: Medicare Other | Admitting: Family Medicine

## 2020-12-07 ENCOUNTER — Encounter: Payer: Self-pay | Admitting: Family Medicine

## 2020-12-07 VITALS — BP 148/69 | HR 65 | Temp 97.4°F | Ht 60.0 in | Wt 126.8 lb

## 2020-12-07 DIAGNOSIS — I10 Essential (primary) hypertension: Secondary | ICD-10-CM

## 2020-12-07 DIAGNOSIS — E039 Hypothyroidism, unspecified: Secondary | ICD-10-CM

## 2020-12-07 DIAGNOSIS — I872 Venous insufficiency (chronic) (peripheral): Secondary | ICD-10-CM | POA: Diagnosis not present

## 2020-12-07 DIAGNOSIS — F411 Generalized anxiety disorder: Secondary | ICD-10-CM | POA: Diagnosis not present

## 2020-12-07 DIAGNOSIS — Z23 Encounter for immunization: Secondary | ICD-10-CM | POA: Diagnosis not present

## 2020-12-07 DIAGNOSIS — F5104 Psychophysiologic insomnia: Secondary | ICD-10-CM | POA: Diagnosis not present

## 2020-12-07 NOTE — Progress Notes (Signed)
OFFICE VISIT  12/07/2020  CC:  Chief Complaint  Patient presents with   Follow-up    RCI; hypertension, anxiety. Pt is fasting(6 hours)    HPI:    Patient is a 85 y.o. female who presents for 3 mo f/u HTN, GAD, and chronic venous insufficiency edema (+hx of stasis dermatitis). A/P as of last visit: "1) HTN: elevated bp today. She's been sensitive to bp med alterations in the past so I won't change anything today.  I asked her to check her bp 3 days out of the next week and call with report. BMET today.   2) Varicose vein dz, bilat LE venous insufficiency with intermittent edema: doing well now. Has lasix for prn use but has not needed lately. Low Na diet.  Elevation prn. Recent stasis dermatitis has resolved.   3) Anxiety/insomnia: taking 5mg  diazepam qhs and helping well."  INTERIM HX: Jocelyn Lamer is feeling quite well.  Enjoys working part time at [employer]. This has given her a new sense of purpose and happiness!  No need for lasix lately at all.  She does not need compression hose.  Occ home bp check around 130/80 or less. After last f/u visit in August she sent report of her home bp's-->115-142 over 64-89, hr 60s-70s. I recommended no med changes.  Mood is stable, has taken a better overall viewpoint and attitude regarding her relationship with her daughter and grandson.  Her anxiety is up and down some days and she usually takes a half of Valium at night, occasionally a daytime dose.  She is happy to report that her lower extremity dysesthesias and her vertigo are all better with PT.   ROS as above, plus--> no fevers, no CP, no SOB, no wheezing, no cough, no dizziness, no HAs, no rashes, no melena/hematochezia.  No polyuria or polydipsia.  No myalgias or arthralgias.  No focal weakness, paresthesias, or tremors.  No acute vision or hearing abnormalities.  No dysuria or unusual/new urinary urgency or frequency.  No recent changes in lower legs. No n/v/d or abd pain.  No  palpitations.    Past Medical History:  Diagnosis Date   Allergic rhinitis    with upper airway cough: as of 07/2017 pulm f/u she was instructed to use astelin, flonase, and saline more consistently   BPPV (benign paroxysmal positional vertigo) 03/2018   Chest pain, non-cardiac    Cardiac CT showed no signif obstructive dz, mildly elevated calcium level (Dr. [name]).   Chronic cystitis with hematuria    [name]   Chronic hypoxemic respiratory failure ([place]) 02/02/2015   2L oxygen 24/7   Chronic renal insufficiency, stage 2 (mild)    GFR approx 60   Cold hands    NCS/EMGs normal.  Hand dysesthesias per neuro--reassured   COPD (chronic obstructive pulmonary disease) ([place])    GOLD 4.  spirometry 01/10/09 FEV 0.97(52%), FEV1% 47.  With chronic bronchitis as of 07/2017.   DDD (degenerative disc disease), cervical    1998 MRI C5-6 impingemt (left).  MRI 2021 (neurol) 1 level canal stenosis, 1 level foraminal stenosis--->PT   DDD (degenerative disc disease), lumbar    Depression with anxiety 04/15/2011   Diarrheal disease Summer 2017   ? 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).  As of 10/05/15 pt reports GI dx'd her with C diff and rx'd more flagyl and she is also on cholestyramine.  As of 02/2016, pt's sx's  resolved completely.   Diverticulosis of colon    Procto '97 and colonoscopy 2002   Dysplastic nevus of upper extremity 04/2014   R tricep ([name])   Edema of both lower extremities due to peripheral venous insufficiency    Varicosities bilat, swelling L>R.  R baker's cyst.  Got vein clinic eval 02/2018->sclerotherapy recommended but since no hemorrhage or ulceration, insurance will not cover the procedure.   Family history of adverse reaction to anesthesia    mother died when patient age 56 receiving anesthesia for thyroid surgery   Fibrocystic breast disease     w/fibroadenoma.  Bx's showed NO atypia (Dr. Margot Chimes).  Mammo neg 2008.   GERD (gastroesophageal reflux disease)    History of double vision    Ophthalmologist, Dr. Herbert Deaner, is further evaluating this with MRI  orbits and limited brain (myesthenia gravis testing neg 07/2010)   History of home oxygen therapy    uses 2 liters at hs   Hypertension    EKG 03/2010 normal   Hypothyroidism    Hashimoto's, dx'd 1989   Idiopathic peripheral neuropathy 04/2018   Sensory (numb feet) S1 distribution bilat, c/w spinal stenosis sensory neuropathy (no pain). 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  Labs [place], MRI C spine->some spondylosis/stenosis.  UE NCS/EMS: normal 04/2019.   Lesion of right native kidney 2017   found on CT 02/2015.  F/u MRI 03/31/15 showed it to be smaller and less likely of any significance but repeat CT renal protocol in 28mo was recommended by radiology.  This was done 10/26/15 and showed no interval change in the lesion, but b/c the lesion enhances with contrast, radiol rec'd urol consult (b/c pap renal RCC could not be excluded).  Saw urol 03/06/16--stable on u/s 09/2016 and 09/2017.   OAB (overactive bladder)    Osteoarthritis, hip, bilateral    she is s/p bilat THA as of 02/2018.   Osteoporosis    radius, T score -2.5->rec'd alendronate 08/2019->plan rpt DEXA 08/2021.   Pneumonia    Postmenopausal atrophic vaginitis    improved with estrace 2 X/week.   Prolapse of vaginal cuff after hysterectomy    10/2018: Dr. Wayne Both. Mild colpocele 04/2019 per GYN (no surgery)   Pulmonary nodule    CT chest 12/10, June 2011.  No nodule on CT 06/2010.   Recurrent Clostridium difficile diarrhea    Episode 09/19/2016: resolved with prolonged course of flagyl.  11/2016 recurrence treated with 10d of Dificid (Fidaxomicin) by GI (Dr. Earlean Shawl).   Recurrent UTI    likely related to pt's atrophic vaginitis.  Urol started vaginal estrace 03/2016.   Rib  fracture 09/2018   R 7th, laterally-->sustained when her dog pulled her over and she fell.   RLS (restless legs syndrome)    neuro->gabapentin trial 2020/21   Shingles 02/2014   R abd/flank   Spinal stenosis of lumbar region with neurogenic claudication    2008 MRI (L3-4, L4-5, L5-S1).  +S1 distribution sensory loss bilat    Past Surgical History:  Procedure Laterality Date   ABDOMINAL HYSTERECTOMY  1978   no BSO per pt--nonmalignant reasons   APPENDECTOMY     BREAST CYST EXCISION     Last screening mammogram 10/2010 was normal.   CHOLECYSTECTOMY  2001   COLONOSCOPY  04/2005; 12/06/2015   2007 NORMAL.  2017 adenomatous polyp x 1.  Mod sigmoid diverticulosis (Dr. Earlean Shawl).     DEXA  05/18/2017; 08/7019   2019 NORMAL (T-score 0.1).  08/2019->T  score -2.5->alendronate started->plan Rpt 08/2021.   HIP CLOSED REDUCTION Right 12/25/2015   Procedure: CLOSED REDUCTION HIP;  Surgeon: Nicholes Stairs, MD;  Location: [facility];  Service: Orthopedics;  Laterality: Right;   NCV/EMS  10/2018   chronic and symmetric sensorimotor axonal polyneuropathy affecting the lower extremities   TONSILLECTOMY     TOTAL HIP ARTHROPLASTY Right 11/01/2014   Procedure: RIGHT TOTAL HIP ARTHROPLASTY;  Surgeon: Latanya Maudlin, MD;  Location: [facility];  Service: Orthopedics;  Laterality: Right;   TOTAL HIP ARTHROPLASTY Left 12/08/2017   Procedure: LEFT TOTAL HIP ARTHROPLASTY ANTERIOR APPROACH;  Surgeon: Paralee Cancel, MD;  Location: [facility];  Service: Orthopedics;  Laterality: Left;  70 mins   TRANSTHORACIC ECHOCARDIOGRAM  08/2014   EF 60-65%, grade I diast dysfxn   TUBAL LIGATION  1974    Outpatient Medications Prior to Visit  Medication Sig Dispense Refill   alendronate (FOSAMAX) 70 MG tablet TAKE 1 TABLET(70 MG) BY MOUTH 1 TIME A WEEK WITH A FULL GLASS OF WATER AND ON AN EMPTY STOMACH 12 tablet 3   amLODipine (NORVASC) 5 MG tablet TAKE 1 TABLET(5 MG) BY MOUTH DAILY 90 tablet 3   Artificial Tear Solution (SOOTHE XP)  SOLN Place 1 drop into both eyes 2 (two) times daily as needed (irritation).     aspirin EC 81 MG tablet Take 81 mg by mouth daily.     clobetasol (TEMOVATE) 0.05 % external solution APPLY TO THE AFFECTED AREA EVERY DAY 50 mL 4   diazepam (VALIUM) 5 MG tablet TAKE 1 TABLET BY MOUTH DAILY AS NEEDED FOR ANXIETY AND INSOMNIA 30 tablet 5   fluorometholone (FML) 0.1 % ophthalmic suspension Place 1 drop into both eyes as directed. Tapering off     Fluticasone-Umeclidin-Vilant (TRELEGY ELLIPTA) 100-62.5-25 MCG/INH AEPB Inhale 1 puff into the lungs daily. 60 each 6   Fluticasone-Umeclidin-Vilant (TRELEGY ELLIPTA) 100-62.5-25 MCG/INH AEPB Inhale 1 puff into the lungs daily. 14 each 0   nitrofurantoin (MACRODANTIN) 50 MG capsule Take 1 capsule (50 mg total) by mouth at bedtime. 90 capsule 3   Probiotic Product (PROBIOTIC DAILY PO) Take 1 capsule by mouth daily.      roflumilast (DALIRESP) 500 MCG TABS tablet Take 1 tablet (500 mcg total) by mouth daily. 90 tablet 3   SYNTHROID 88 MCG tablet TAKE 1 TABLET(88 MCG) BY MOUTH DAILY 30 tablet 1   venlafaxine (EFFEXOR) 50 MG tablet TAKE 1 TABLET BY MOUTH TWICE DAILY 180 tablet 1   VISINE 0.05 % ophthalmic solution 1 drop  as needed     vitamin B-12 (CYANOCOBALAMIN) 100 MCG tablet Take 100 mcg by mouth daily.     VITAMIN D PO Take by mouth.     mirabegron ER (MYRBETRIQ) 25 MG TB24 tablet Take 1 tablet (25 mg total) by mouth daily. 30 tablet 3   albuterol (PROAIR HFA) 108 (90 Base) MCG/ACT inhaler Inhale 2 puffs into the lungs every 4 (four) hours as needed for wheezing or shortness of breath. (Patient not taking: Reported on 12/07/2020) 8.5 g 1   azelastine (ASTELIN) 0.1 % nasal spray Place 2 sprays into both nostrils 2 (two) times daily. Use in each nostril as directed (Patient not taking: Reported on 12/07/2020) 30 mL 3   cetirizine (ZYRTEC) 10 MG tablet Take 1 tablet (10 mg total) by mouth daily. (Patient not taking: Reported on 12/07/2020) 30 tablet 6    fluticasone (FLONASE) 50 MCG/ACT nasal spray Place 2 sprays into both nostrils daily. (Patient not taking: Reported  on 12/07/2020) 16 g 6   furosemide (LASIX) 20 MG tablet TAKE 1 TABLET BY MOUTH EVERY DAY AS NEEDED FOR INCREASED FLUID COLLECTION IN LEGS (Patient not taking: Reported on 12/07/2020) 30 tablet 3   triamcinolone cream (KENALOG) 0.1 % Apply 1 application topically 2 (two) times daily. (Patient not taking: Reported on 12/07/2020) 45 g 2   Vibegron (GEMTESA) 75 MG TABS Take 1 capsule by mouth daily. (Patient not taking: Reported on 12/07/2020) 30 tablet 11   No facility-administered medications prior to visit.    Allergies  Allergen Reactions   Losartan Other (See Comments)    hyperkalemia   Penicillins Hives, Swelling and Rash    Has patient had a PCN reaction causing immediate rash, facial/tongue/throat swelling, SOB or lightheadedness with hypotension: YES Has patient had a PCN reaction causing severe rash involving mucus membranes or skin necrosis: NO Has patient had a PCN reaction that required hospitalization NO Has patient had a PCN reaction occurring within the last 10 years: NO If all of the above answers are "NO", then may proceed with Cephalosporin use.    Cefixime [Kdc:Cefixime] Other (See Comments)    unspecified   Gabapentin Other (See Comments)    Feels woozy   Indocin [Indomethacin] Other (See Comments)    Painful tongue and throat   Singulair [Montelukast Sodium]     Chest tightness, decreased mental clarity   Sulfa Antibiotics Other (See Comments)    unspecified   Ace Inhibitors Other (See Comments)    cough   Statins Other (See Comments)    Myalgias     ROS As per HPI  PE: Vitals with BMI 12/07/2020 11/23/2020 11/01/2020  Height 5\' 0"  - 5\' 0"   Weight 126 lbs 13 oz - 129 lbs  BMI 57.84 - 69.62  Systolic 952 841 324  Diastolic 69 77 71  Pulse 65 71 63  Some encounter information is confidential and restricted. Go to Review Flowsheets activity to see  all data.  02 sat 96% on RA today  General: Alert and well-appearing, pleasant. Cardiovascular: Regular rhythm and rate, no murmur or rub or gallop. Lungs are clear to auscultation bilaterally, breathing nonlabored. Extremities show no clubbing or cyanosis.  She has 1+ pitting on the left lower leg and no pitting on the right lower leg.  Some scattered spider veins and noninflamed varicosities.  LABS:    Chemistry      Component Value Date/Time   NA 140 09/07/2020 1444   K 4.0 09/07/2020 1444   CL 107 09/07/2020 1444   CO2 23 09/07/2020 1444   BUN 18 09/07/2020 1444   CREATININE 1.04 (H) 09/07/2020 1444      Component Value Date/Time   CALCIUM 8.9 09/07/2020 1444   ALKPHOS 105 05/09/2020 1347   AST 16 05/09/2020 1347   ALT 13 05/09/2020 1347   BILITOT 0.5 05/09/2020 1347     Lab Results  Component Value Date   WBC 6.2 05/09/2020   HGB 14.5 05/09/2020   HCT 44.0 05/09/2020   MCV 89.8 05/09/2020   PLT 255.0 05/09/2020   Lab Results  Component Value Date   CHOL 211 (H) 05/26/2018   HDL 68.20 05/26/2018   LDLCALC 127 (H) 05/26/2018   LDLDIRECT 131.3 03/07/2010   TRIG 79.0 05/26/2018   CHOLHDL 3 05/26/2018   Lab Results  Component Value Date   TSH 1.12 07/10/2020   Lab Results  Component Value Date   HGBA1C 5.7 10/29/2011   IMPRESSION AND PLAN:  #  1 hypertension: Good control.  Continue amlodipine 5 mg a day. Electrolytes and creatinine checked today.  2.  Lower extremity venous insufficiency, intermittent edema.  She is doing well lately and has no requirement for Lasix.  She does have Lasix on hand to use as needed.  #3 hypothyroidism.  Hypoth: Takes T4 on empty stomach w/out any other meds. Check TSH level today.  #4 Generalized body disorder anxiety-related insomnia: Doing well on half of a 5 mg diazepam tablet in the evening, occasional half tab in the daytime as well. An After Visit Summary was printed and given to the patient.  #5 hyperlipidemia,  mild.  On her last lipid panel over 2 years ago her LDL was 127 and HDL 68.  Triglycerides normal.  She is on no medications. Recheck lipid panel today--she last ate about 6 to 7 hours ago.  FOLLOW UP: Return in about 3 months (around 03/09/2021) for routine chronic illness f/u.  Signed:  Crissie Sickles, MD           12/07/2020

## 2020-12-08 LAB — COMPREHENSIVE METABOLIC PANEL
AG Ratio: 1.9 (calc) (ref 1.0–2.5)
ALT: 10 U/L (ref 6–29)
AST: 14 U/L (ref 10–35)
Albumin: 4.1 g/dL (ref 3.6–5.1)
Alkaline phosphatase (APISO): 75 U/L (ref 37–153)
BUN: 18 mg/dL (ref 7–25)
CO2: 22 mmol/L (ref 20–32)
Calcium: 9.4 mg/dL (ref 8.6–10.4)
Chloride: 105 mmol/L (ref 98–110)
Creat: 0.83 mg/dL (ref 0.60–0.95)
Globulin: 2.2 g/dL (calc) (ref 1.9–3.7)
Glucose, Bld: 74 mg/dL (ref 65–99)
Potassium: 4 mmol/L (ref 3.5–5.3)
Sodium: 140 mmol/L (ref 135–146)
Total Bilirubin: 0.5 mg/dL (ref 0.2–1.2)
Total Protein: 6.3 g/dL (ref 6.1–8.1)

## 2020-12-08 LAB — LIPID PANEL
Cholesterol: 198 mg/dL (ref <200)
HDL: 66 mg/dL (ref >=50)
LDL Cholesterol (Calc): 113 mg/dL (calc) — ABNORMAL HIGH
Non-HDL Cholesterol (Calc): 132 mg/dL (calc) — ABNORMAL HIGH (ref <130)
Total CHOL/HDL Ratio: 3 (calc) (ref <5.0)
Triglycerides: 88 mg/dL (ref <150)

## 2020-12-08 LAB — TSH: TSH: 2.57 mIU/L (ref 0.40–4.50)

## 2020-12-11 ENCOUNTER — Emergency Department (HOSPITAL_BASED_OUTPATIENT_CLINIC_OR_DEPARTMENT_OTHER): Payer: Medicare Other

## 2020-12-11 ENCOUNTER — Other Ambulatory Visit: Payer: Self-pay

## 2020-12-11 ENCOUNTER — Emergency Department (HOSPITAL_BASED_OUTPATIENT_CLINIC_OR_DEPARTMENT_OTHER)
Admission: EM | Admit: 2020-12-11 | Discharge: 2020-12-11 | Disposition: A | Discharge status: Home / Self Care | Payer: Medicare Other | Attending: Emergency Medicine | Admitting: Emergency Medicine

## 2020-12-11 ENCOUNTER — Telehealth (HOSPITAL_BASED_OUTPATIENT_CLINIC_OR_DEPARTMENT_OTHER): Payer: Self-pay | Admitting: Emergency Medicine

## 2020-12-11 ENCOUNTER — Encounter (HOSPITAL_BASED_OUTPATIENT_CLINIC_OR_DEPARTMENT_OTHER): Payer: Self-pay

## 2020-12-11 DIAGNOSIS — Z96643 Presence of artificial hip joint, bilateral: Secondary | ICD-10-CM | POA: Insufficient documentation

## 2020-12-11 DIAGNOSIS — J449 Chronic obstructive pulmonary disease, unspecified: Secondary | ICD-10-CM | POA: Diagnosis not present

## 2020-12-11 DIAGNOSIS — N182 Chronic kidney disease, stage 2 (mild): Secondary | ICD-10-CM | POA: Diagnosis not present

## 2020-12-11 DIAGNOSIS — Z87891 Personal history of nicotine dependence: Secondary | ICD-10-CM | POA: Insufficient documentation

## 2020-12-11 DIAGNOSIS — Y990 Civilian activity done for income or pay: Secondary | ICD-10-CM | POA: Insufficient documentation

## 2020-12-11 DIAGNOSIS — R0789 Other chest pain: Secondary | ICD-10-CM | POA: Insufficient documentation

## 2020-12-11 DIAGNOSIS — E039 Hypothyroidism, unspecified: Secondary | ICD-10-CM | POA: Insufficient documentation

## 2020-12-11 DIAGNOSIS — S0990XA Unspecified injury of head, initial encounter: Secondary | ICD-10-CM | POA: Diagnosis not present

## 2020-12-11 DIAGNOSIS — H532 Diplopia: Secondary | ICD-10-CM | POA: Diagnosis not present

## 2020-12-11 DIAGNOSIS — Z7982 Long term (current) use of aspirin: Secondary | ICD-10-CM | POA: Diagnosis not present

## 2020-12-11 DIAGNOSIS — H10502 Unspecified blepharoconjunctivitis, left eye: Secondary | ICD-10-CM | POA: Diagnosis not present

## 2020-12-11 DIAGNOSIS — I129 Hypertensive chronic kidney disease with stage 1 through stage 4 chronic kidney disease, or unspecified chronic kidney disease: Secondary | ICD-10-CM | POA: Diagnosis not present

## 2020-12-11 DIAGNOSIS — S0101XA Laceration without foreign body of scalp, initial encounter: Secondary | ICD-10-CM | POA: Diagnosis not present

## 2020-12-11 DIAGNOSIS — H02055 Trichiasis without entropian left lower eyelid: Secondary | ICD-10-CM | POA: Diagnosis not present

## 2020-12-11 DIAGNOSIS — W01198A Fall on same level from slipping, tripping and stumbling with subsequent striking against other object, initial encounter: Secondary | ICD-10-CM | POA: Insufficient documentation

## 2020-12-11 DIAGNOSIS — W19XXXA Unspecified fall, initial encounter: Secondary | ICD-10-CM

## 2020-12-11 DIAGNOSIS — R58 Hemorrhage, not elsewhere classified: Secondary | ICD-10-CM | POA: Diagnosis not present

## 2020-12-11 DIAGNOSIS — R519 Headache, unspecified: Secondary | ICD-10-CM | POA: Diagnosis not present

## 2020-12-11 DIAGNOSIS — M25511 Pain in right shoulder: Secondary | ICD-10-CM | POA: Diagnosis not present

## 2020-12-11 DIAGNOSIS — Z79899 Other long term (current) drug therapy: Secondary | ICD-10-CM | POA: Diagnosis not present

## 2020-12-11 DIAGNOSIS — M542 Cervicalgia: Secondary | ICD-10-CM | POA: Diagnosis not present

## 2020-12-11 DIAGNOSIS — I10 Essential (primary) hypertension: Secondary | ICD-10-CM | POA: Diagnosis not present

## 2020-12-11 MED ORDER — ACETAMINOPHEN 500 MG PO TABS
1000.0000 mg | ORAL_TABLET | Freq: Once | ORAL | Status: AC
  Administered 2020-12-11: 1000 mg via ORAL
  Filled 2020-12-11: qty 2

## 2020-12-11 NOTE — ED Provider Notes (Signed)
Priscilla Cantrell Provider Note   CSN: [id number] Arrival date & time: 12/11/20  1240     History Chief Complaint  Patient presents with   Lytle Michaels    Priscilla Cantrell is a 85 y.o. female with history of generative disc disease cervical, degenerative disc disease lumbar, hypertension.  Presents to the emergency [facility]rell with a chief complaint of fall and head injury.  Patient states that approximately 1 hour prior while working at [employer] she was knocked over by someone opening a door.  Patient lost her balance and fell against a wall striking her head on a hand dryer.  Patient denies any loss of consciousness.  Patient was able to stand and ambulate after this injury.  Patient complains of pain to laceration on her head.  Patient rates pain 4/10 on the pain scale.  No aggravating factors.  Patient has not tried any modalities to alleviate her symptoms.  Patient denies any headache, numbness, weakness, visual disturbance, saddle anesthesia, neck pain, back pain, myalgias, arthralgias.    Patient is not on any blood thinners.  Patient states that her last tetanus shot was earlier this year.   Fall Pertinent negatives include no chest pain, no abdominal pain, no headaches and no shortness of breath.      Past Medical History:  Diagnosis Date   Allergic rhinitis    with upper airway cough: as of 07/2017 pulm f/u she was instructed to use astelin, flonase, and saline more consistently   BPPV (benign paroxysmal positional vertigo) 03/2018   Chest pain, non-cardiac    Cardiac CT showed no signif obstructive dz, mildly elevated calcium level (Dr. [name]).   Chronic cystitis with hematuria    [name]   Chronic hypoxemic respiratory failure ([place]) 02/02/2015   2L oxygen 24/7   Chronic renal insufficiency, stage 2 (mild)    GFR approx 60   Cold hands    NCS/EMGs normal.  Hand dysesthesias per neuro--reassured   COPD (chronic obstructive  pulmonary disease) ([place])    GOLD 4.  spirometry 01/10/09 FEV 0.97(52%), FEV1% 47.  With chronic bronchitis as of 07/2017.   DDD (degenerative disc disease), cervical    1998 MRI C5-6 impingemt (left).  MRI 2021 (neurol) 1 level canal stenosis, 1 level foraminal stenosis--->PT   DDD (degenerative disc disease), lumbar    Depression with anxiety 04/15/2011   Diarrheal disease Summer 2017   ? 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).  As of 10/05/15 pt reports GI dx'd her with C diff and rx'd more flagyl and she is also on cholestyramine.  As of 02/2016, pt's sx's resolved completely.   Diverticulosis of colon    Procto '97 and colonoscopy 2002   Dysplastic nevus of upper extremity 04/2014   R tricep ([name])   Edema of both lower extremities due to peripheral venous insufficiency    Varicosities bilat, swelling L>R.  R baker's cyst.  Got vein clinic eval 02/2018->sclerotherapy recommended but since no hemorrhage or ulceration, insurance will not cover the procedure.   Family history of adverse reaction to anesthesia    mother died when patient age 85 receiving anesthesia for thyroid surgery   Fibrocystic breast disease    w/fibroadenoma.  Bx's showed NO atypia (Dr. Margot Chimes).  Mammo neg 2008.   GERD (gastroesophageal reflux disease)    History of double vision    Ophthalmologist, Dr. Herbert Deaner, is further evaluating this  with MRI  orbits and limited brain (myesthenia gravis testing neg 07/2010)   History of home oxygen therapy    uses 2 liters at hs   Hypertension    EKG 03/2010 normal   Hypothyroidism    Hashimoto's, dx'd 1989   Idiopathic peripheral neuropathy 04/2018   Sensory (numb feet) S1 distribution bilat, c/w spinal stenosis sensory neuropathy (no pain). 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  Labs [place], MRI C spine->some  spondylosis/stenosis.  UE NCS/EMS: normal 04/2019.   Lesion of right native kidney 2017   found on CT 02/2015.  F/u MRI 03/31/15 showed it to be smaller and less likely of any significance but repeat CT renal protocol in 51mo was recommended by radiology.  This was done 10/26/15 and showed no interval change in the lesion, but b/c the lesion enhances with contrast, radiol rec'd urol consult (b/c pap renal RCC could not be excluded).  Saw urol 03/06/16--stable on u/s 09/2016 and 09/2017.   OAB (overactive bladder)    Osteoarthritis, hip, bilateral    she is s/p bilat THA as of 02/2018.   Osteoporosis    radius, T score -2.5->rec'd alendronate 08/2019->plan rpt DEXA 08/2021.   Pneumonia    Postmenopausal atrophic vaginitis    improved with estrace 2 X/week.   Prolapse of vaginal cuff after hysterectomy    10/2018: Dr. Wayne Both. Mild colpocele 04/2019 per GYN (no surgery)   Pulmonary nodule    CT chest 12/10, June 2011.  No nodule on CT 06/2010.   Recurrent Clostridium difficile diarrhea    Episode 09/19/2016: resolved with prolonged course of flagyl.  11/2016 recurrence treated with 10d of Dificid (Fidaxomicin) by GI (Dr. Earlean Shawl).   Recurrent UTI    likely related to pt's atrophic vaginitis.  Urol started vaginal estrace 03/2016.   Rib fracture 09/2018   R 7th, laterally-->sustained when her dog pulled her over and she fell.   RLS (restless legs syndrome)    neuro->gabapentin trial 2020/21   Shingles 02/2014   R abd/flank   Spinal stenosis of lumbar region with neurogenic claudication    2008 MRI (L3-4, L4-5, L5-S1).  +S1 distribution sensory loss bilat    Patient Active Problem List   Diagnosis Date Noted   Shortness of breath 12/08/2019   Swelling of lower extremity 12/08/2019   Stress due to family tension 12/08/2019   Chronic cystitis with hematuria 08/15/2019   Urinary urgency 08/15/2019   Multiple closed fractures of ribs of right side 10/05/2018   Healthcare maintenance 10/05/2018    Medication management 07/29/2018   Overweight (BMI 25.0-29.9) 12/09/2017   S/P left KA 12/08/2017   S/P left THA, AA 12/08/2017   Postherpetic neuralgia 03/07/2015   Constipation 02/28/2015   Renal mass 02/28/2015   Acute pyelonephritis 02/20/2015   Oral thrush 02/08/2015   Chronic hypoxemic respiratory failure ([place]) 02/02/2015   SOB (shortness of breath)    Essential hypertension 01/24/2015   UTI (urinary tract infection) 01/09/2015   Pain and swelling of right lower leg 01/09/2015   H/O total hip arthroplasty 11/01/2014   Spinal stenosis, lumbar region, with neurogenic claudication 01/04/2014   Osteoarthritis, hip, bilateral 07/27/2013   Sciatica associated with disorder of lumbosacral spine 04/19/2013   Atrophic vaginitis 01/12/2012   Periodic limb movement disorder 11/24/2011   Hypoxia 05/22/2011   Depression with anxiety 04/15/2011   Chronic venous insufficiency 07/29/2010   Hypothyroidism due to Hashimoto's thyroiditis 01/07/2010   SPINAL STENOSIS 01/07/2010   Allergic  rhinitis 08/09/2007   COPD GOLD III B 08/09/2007    Past Surgical History:  Procedure Laterality Date   ABDOMINAL HYSTERECTOMY  1978   no BSO per pt--nonmalignant reasons   APPENDECTOMY     BREAST CYST EXCISION     Last screening mammogram 10/2010 was normal.   CHOLECYSTECTOMY  2001   COLONOSCOPY  04/2005; 12/06/2015   2007 NORMAL.  2017 adenomatous polyp x 1.  Mod sigmoid diverticulosis (Dr. Earlean Shawl).     DEXA  05/18/2017; 08/7019   2019 NORMAL (T-score 0.1).  08/2019->T score -2.5->alendronate started->plan Rpt 08/2021.   HIP CLOSED REDUCTION Right 12/25/2015   Procedure: CLOSED REDUCTION HIP;  Surgeon: Nicholes Stairs, MD;  Location: [facility];  Service: Orthopedics;  Laterality: Right;   NCV/EMS  10/2018   chronic and symmetric sensorimotor axonal polyneuropathy affecting the lower extremities   TONSILLECTOMY     TOTAL HIP ARTHROPLASTY Right 11/01/2014   Procedure: RIGHT TOTAL HIP ARTHROPLASTY;   Surgeon: Latanya Maudlin, MD;  Location: [facility];  Service: Orthopedics;  Laterality: Right;   TOTAL HIP ARTHROPLASTY Left 12/08/2017   Procedure: LEFT TOTAL HIP ARTHROPLASTY ANTERIOR APPROACH;  Surgeon: Paralee Cancel, MD;  Location: [facility];  Service: Orthopedics;  Laterality: Left;  70 mins   TRANSTHORACIC ECHOCARDIOGRAM  08/2014   EF 60-65%, grade I diast dysfxn   TUBAL LIGATION  1974     OB History     Gravida  1   Para  1   Term      Preterm      AB      Living  1      SAB      IAB      Ectopic      Multiple      Live Births              Family History  Problem Relation Age of Onset   Goiter Mother    Psoriasis Father        od liver   Cirrhosis Father    Diabetes Daughter        Type 2   Stroke Maternal Grandfather    Stroke Paternal Grandmother    Hypertension Paternal Grandmother    Hernia Paternal Grandmother    Cancer Paternal Grandfather        lung   Cancer Maternal Aunt        cancer of the stomach    Social History   Tobacco Use   Smoking status: Former    Packs/day: 1.50    Years: 34.00    Pack years: 51.00    Types: Cigarettes    Start date: 89    Quit date: 02/03/1990    Years since quitting: 30.8   Smokeless tobacco: Never  Vaping Use   Vaping Use: Never used  Substance Use Topics   Alcohol use: Yes    Comment: Rarely   Drug use: No    Home Medications Prior to Admission medications   Medication Sig Start Date End Date Taking? Authorizing Provider  albuterol (PROAIR HFA) 108 (90 Base) MCG/ACT inhaler Inhale 2 puffs into the lungs every 4 (four) hours as needed for wheezing or shortness of breath. Patient not taking: Reported on 12/07/2020 05/22/20   [name], MD  alendronate (FOSAMAX) 70 MG tablet TAKE 1 TABLET(70 MG) BY MOUTH 1 TIME A WEEK WITH A FULL GLASS OF WATER AND ON AN EMPTY STOMACH 09/10/20   McGowen, Adrian Blackwater, MD  amLODipine (  NORVASC) 5 MG tablet TAKE 1 TABLET(5 MG) BY MOUTH DAILY 09/07/20   McGowen, Adrian Blackwater, MD  Artificial Tear Solution (SOOTHE XP) SOLN Place 1 drop into both eyes 2 (two) times daily as needed (irritation).    [provider]  aspirin EC 81 MG tablet Take 81 mg by mouth daily.    [provider]  azelastine (ASTELIN) 0.1 % nasal spray Place 2 sprays into both nostrils 2 (two) times daily. Use in each nostril as directed Patient not taking: Reported on 12/07/2020 05/22/20   [name], MD  cetirizine (ZYRTEC) 10 MG tablet Take 1 tablet (10 mg total) by mouth daily. Patient not taking: Reported on 12/07/2020 05/22/20   [name], MD  clobetasol (TEMOVATE) 0.05 % external solution APPLY TO THE AFFECTED AREA EVERY DAY 03/08/20   Sheffield, Vida Roller R, PA-C  diazepam (VALIUM) 5 MG tablet TAKE 1 TABLET BY MOUTH DAILY AS NEEDED FOR ANXIETY AND INSOMNIA 11/21/20   McGowen, Adrian Blackwater, MD  fluorometholone (FML) 0.1 % ophthalmic suspension Place 1 drop into both eyes as directed. Tapering off    [provider]  fluticasone (FLONASE) 50 MCG/ACT nasal spray Place 2 sprays into both nostrils daily. Patient not taking: Reported on 12/07/2020 05/22/20   [name], MD  Fluticasone-Umeclidin-Vilant (TRELEGY ELLIPTA) 100-62.5-25 MCG/INH AEPB Inhale 1 puff into the lungs daily. 11/12/20   Chesley Mires, MD  Fluticasone-Umeclidin-Vilant (TRELEGY ELLIPTA) 100-62.5-25 MCG/INH AEPB Inhale 1 puff into the lungs daily. 11/20/20   Chesley Mires, MD  furosemide (LASIX) 20 MG tablet TAKE 1 TABLET BY MOUTH EVERY DAY AS NEEDED FOR INCREASED FLUID COLLECTION IN LEGS Patient not taking: Reported on 12/07/2020 11/12/20   McGowen, Adrian Blackwater, MD  nitrofurantoin (MACRODANTIN) 50 MG capsule Take 1 capsule (50 mg total) by mouth at bedtime. 11/23/20   McKenzie, [name], MD  Probiotic Product (PROBIOTIC DAILY PO) Take 1 capsule by mouth daily.     [provider]  roflumilast (DALIRESP) 500 MCG TABS tablet Take 1 tablet (500 mcg total) by mouth daily. 05/31/20   Martyn Ehrich, NP  SYNTHROID 88 MCG tablet TAKE 1 TABLET(88 MCG) BY MOUTH DAILY 10/11/20   McGowen, Adrian Blackwater, MD  triamcinolone cream (KENALOG) 0.1 % Apply 1 application topically 2 (two) times daily. Patient not taking: Reported on 12/07/2020 08/17/20   Howard Pouch A, DO  venlafaxine (EFFEXOR) 50 MG tablet TAKE 1 TABLET BY MOUTH TWICE DAILY 11/12/20   McGowen, Adrian Blackwater, MD  Vibegron (GEMTESA) 75 MG TABS Take 1 capsule by mouth daily. Patient not taking: Reported on 12/07/2020 11/23/20   Cleon Gustin, MD  VISINE 0.05 % ophthalmic solution 1 drop  as needed 12/14/19   [provider]  vitamin B-12 (CYANOCOBALAMIN) 100 MCG tablet Take 100 mcg by mouth daily.    [provider]  VITAMIN D PO Take by mouth.    [provider]    Allergies    Losartan, Penicillins, Cefixime [kdc:cefixime], Gabapentin, Indocin [indomethacin], Singulair [montelukast sodium], Sulfa antibiotics, Ace inhibitors, and Statins  Review of Systems   Review of Systems  Constitutional:  Negative for chills and fever.  HENT:  Negative for facial swelling.   Eyes:  Negative for visual disturbance.  Respiratory:  Negative for shortness of breath.   Cardiovascular:  Negative for chest pain.  Gastrointestinal:  Negative for abdominal pain, nausea and vomiting.  Genitourinary:  Negative for difficulty urinating and enuresis.  Musculoskeletal:  Negative for arthralgias, back  pain, myalgias and neck pain.  Skin:  Positive for wound. Negative for color change and rash.  Neurological:  Negative for dizziness, tremors, seizures, syncope, facial asymmetry, speech difficulty, weakness, light-headedness, numbness and headaches.  Psychiatric/Behavioral:  Negative for confusion.    Physical Exam Updated Vital Signs BP (!) 175/77 (BP Location: Right Arm)   Pulse (!) 56   Temp 98.5 F (36.9 C) (Oral)   Resp 20   Ht 5' (1.524 m)   Wt 54.9 kg   SpO2 95%   BMI 23.63 kg/m   Physical Exam Vitals and  nursing note reviewed.  Constitutional:      General: She is not in acute distress.    Appearance: She is not ill-appearing, toxic-appearing or diaphoretic.  HENT:     Head: Normocephalic. Contusion and laceration present. No raccoon eyes, Battle's sign or abrasion.     Jaw: No trismus or pain on movement.      Comments: 2 cm laceration with jagged edges, hemorrhage  Eyes:     General: No scleral icterus.       Right eye: No discharge.        Left eye: No discharge.     Extraocular Movements: Extraocular movements intact.     Conjunctiva/sclera: Conjunctivae normal.     Pupils: Pupils are equal, round, and reactive to light.  Cardiovascular:     Rate and Rhythm: Normal rate.  Pulmonary:     Effort: Pulmonary effort is normal.  Chest:     Chest wall: Tenderness present. No mass, lacerations, deformity, swelling, crepitus or edema.     Comments: Tenderness to right clavicle no crepitus or deformity. Abdominal:     Palpations: Abdomen is soft.     Tenderness: There is no abdominal tenderness.  Musculoskeletal:     Cervical back: Normal, normal range of motion and neck supple. No edema, erythema, signs of trauma, rigidity, torticollis or crepitus. No pain with movement, spinous process tenderness or muscular tenderness. Normal range of motion.     Thoracic back: No swelling, edema, deformity, signs of trauma, lacerations, spasms, tenderness or bony tenderness.     Lumbar back: No swelling, edema, deformity, signs of trauma, lacerations, spasms, tenderness or bony tenderness.     Comments: No midline tenderness or deformity to cervical, thoracic, or lumbar spine.  Patient has tenderness to right clavicle.  No obvious deformity noted.  Patient has full range of motion to right upper extremity.  No tenderness, bony tenderness, or deformity to bilateral upper or lower extremities.  No shortening or rotation to bilateral lower extremities.  Pelvis stable.  Skin:    General: Skin is warm  and dry.  Neurological:     General: No focal deficit present.     Mental Status: She is alert.     GCS: GCS eye subscore is 4. GCS verbal subscore is 5. GCS motor subscore is 6.     Cranial Nerves: No cranial nerve deficit or facial asymmetry.     Sensory: Sensation is intact.     Motor: No weakness, tremor or seizure activity.     Coordination: Romberg sign negative. Finger-Nose-Finger Test normal.     Gait: Gait is intact. Gait normal.     Comments: CN II-XII intact; performed in supine position due to fall, +5 strength to bilateral upper extremities, +5 strength to dorsiflexion and plantarflexion, patient able to lift both legs against gravity and hold each there without difficulty; sensation to light touch intact to bilateral upper and  lower extremities.  Patient is able to stand and ambulate without difficulty.  Psychiatric:        Behavior: Behavior is cooperative.    ED Results / Procedures / Treatments   Labs (all labs ordered are listed, but only abnormal results are displayed) Labs Reviewed - No data to display  EKG None  Radiology No results found.  Procedures .Marland KitchenLaceration Repair  Date/Time: 12/11/2020 2:49 PM Performed by: Loni Beckwith, PA-C Authorized by: Loni Beckwith, PA-C   Consent:    Consent obtained:  Verbal   Consent given by:  Patient   Risks discussed:  Infection, need for additional repair, pain, poor cosmetic result and poor wound healing   Alternatives discussed:  No treatment and delayed treatment Universal protocol:    Procedure explained and questions answered to patient or proxy's satisfaction: yes     Imaging studies available: yes     Immediately prior to procedure, a time out was called: yes     Patient identity confirmed:  Verbally with patient Anesthesia:    Anesthesia method:  None Laceration details:    Location:  Scalp   Scalp location:  R parietal   Length (cm):  2   Depth (mm):  2 Pre-procedure details:     Preparation:  Imaging obtained to evaluate for foreign bodies Exploration:    Wound extent: no foreign bodies/material noted   Treatment:    Area cleansed with:  Povidone-iodine   Amount of cleaning:  Standard   Irrigation solution:  Sterile saline Skin repair:    Repair method:  Staples   Number of staples:  2 Approximation:    Approximation:  Loose Repair type:    Repair type:  Simple Post-procedure details:    Dressing:  Sterile dressing Comments:     Patient was offered local anesthesia with lidocaine however chose to defer any local anesthesia.   Medications Ordered in ED Medications  acetaminophen (TYLENOL) tablet 1,000 mg (has no administration in time range)    ED Course  I have reviewed the triage vital signs and the nursing notes.  Pertinent labs & imaging results that were available during my care of the patient were reviewed by me and considered in my medical decision making (see chart for details).    MDM Rules/Calculators/A&P                           Alert 85 year old female no acute distress, nontoxic-appearing.  Presents emergency Cantrell complaint of fall and head injury.  Fall was mechanical.  Patient denies any loss of consciousness.  Patient is not on any blood thinners.  Due to patient's advanced age will obtain noncontrast head and cervical spine CT to evaluate for acute injury.  Patient has tenderness to right clavicle.  Will obtain x-ray imaging to look for acute osseous abnormality.  Laceration to scalp as noted above.  [name] tetanus is up-to-date.  Area cleaned by provider and nurse tech.  Stable procedure as noted above.  Patient will need to have staples removed in 7 to 10 days.  CT imaging shows no acute abnormalities.  X-ray shows no acute osseous abnormality.  Will discharge patient at this time.  Patient advised to use over-the-counter pain medication as needed.  Patient noted to be hypertensive while in the emergency [facility], has history  and states that she did take her medication this morning.  We will have patient follow-up with PCP for repeat assessment.  Discussed  results, findings, treatment and follow up. Patient advised of return precautions. Patient verbalized understanding and agreed with plan.   Final Clinical Impression(s) / ED Diagnoses Final diagnoses:  None    Rx / DC Orders ED Discharge Orders     None        Loni Beckwith, PA-C 12/11/20 1451    Regan Lemming, MD 12/11/20 1642

## 2020-12-11 NOTE — ED Triage Notes (Signed)
Accident at work where a person hit patient with a swinging door that knocked her against the wall hitting her head on the corner of hand dryer then she fell to the floor.

## 2020-12-11 NOTE — Discharge Instructions (Addendum)
You came to the emergency department today to be evaluated for your injuries after suffering a fall.  The CT scans of your head and neck showed no acute abnormalities.  The x-ray of your right shoulder showed no broken bones or dislocations.  The laceration to your scalp required staples.  2 staples were placed.  You will need to see your primary care doctor, urgent care, or return to the emergency department in 7 to 10 days to have the staples removed.  While in the emergency department your blood pressure was found to be elevated.  Please continue take your medication as prescribed.  Please follow-up with your primary care doctor to have your blood pressure reassessed.  Get help right away if: You have: A severe headache that is not helped by medicine. Trouble walking or weakness in your arms and legs. Clear or bloody fluid coming from your nose or ears. Changes in your vision. A seizure. Increased confusion or irritability. Your symptoms get worse. You are sleepier than normal and have trouble staying awake. You lose your balance. Your pupils change size. Your speech is slurred. Your dizziness gets worse. You vomit.

## 2020-12-12 ENCOUNTER — Telehealth: Payer: Self-pay | Admitting: Pharmacy Technician

## 2020-12-12 ENCOUNTER — Telehealth: Payer: Self-pay

## 2020-12-12 DIAGNOSIS — Z596 Low income: Secondary | ICD-10-CM

## 2020-12-12 NOTE — Telephone Encounter (Signed)
Reassure her that these are nothing to worry too much about right now but get her on the schedule either at 4:15 tomorrow or on Friday at 4 or 4:15 and we'll check things out.

## 2020-12-12 NOTE — Telephone Encounter (Signed)
Spoke with pt regarding recommendations,voiced understanding. Appt scheduled for tomorrow at 4:15

## 2020-12-12 NOTE — Telephone Encounter (Signed)
Pt called concerned with BP readings from yesterday, #1 180/80 and #2 159/82. She was unsure if she needed to be seen sooner than Thursday 11/17. Her BP while in the ED was 175/77 with HR 56. She will call back with another BP reading later today.

## 2020-12-12 NOTE — Telephone Encounter (Signed)
Please review and advise.

## 2020-12-12 NOTE — Progress Notes (Addendum)
[facility])                                            [place] Team    12/12/2020  Priscilla Cantrell 01/11/1936 [id number]  FOR 2023 RE ENROLLMENT                                      Medication Assistance Referral  Referral From: [place]  Medication/Company: [name] / [employer] Patient application portion:  N/A per program guidelines and since patient has Med D patient should be automatically re enrolled for 8721 Provider application portion: Faxed  to [name] Provider address/fax verified via: Office website   Stewart Sasaki P. Nitesh Pitstick, [place]  [phone]

## 2020-12-12 NOTE — Telephone Encounter (Signed)
Patient calling back to give additional blood pressure reading for the afternoon. Please advice.    1:25PM   145/70     HR:62 1:20PM   151/68    HR:62 2:50PM    123/67    HR:66

## 2020-12-13 ENCOUNTER — Encounter: Payer: Self-pay | Admitting: Family Medicine

## 2020-12-13 ENCOUNTER — Other Ambulatory Visit: Payer: Self-pay

## 2020-12-13 ENCOUNTER — Ambulatory Visit (INDEPENDENT_AMBULATORY_CARE_PROVIDER_SITE_OTHER): Payer: Medicare Other | Admitting: Family Medicine

## 2020-12-13 VITALS — BP 160/75 | HR 58 | Temp 97.5°F | Resp 16 | Ht 60.0 in | Wt 128.2 lb

## 2020-12-13 DIAGNOSIS — S0101XD Laceration without foreign body of scalp, subsequent encounter: Secondary | ICD-10-CM

## 2020-12-13 DIAGNOSIS — I10 Essential (primary) hypertension: Secondary | ICD-10-CM

## 2020-12-13 NOTE — Progress Notes (Signed)
OFFICE VISIT  12/13/2020  CC:  Chief Complaint  Patient presents with   Hypertension    Has been doing at home blood pressure checks   HPI:    Patient is a 85 y.o. female who presents for concern about recent elevated blood pressures.  INTERIM HX: Unfortunately 2 days ago Priscilla Cantrell was accidentally pushed into a door at work and then fell and hit her head on a hand dryer.  No loss of consciousness.  Sustained a scalp laceration.  She got this stapled in the ER.  She did have some CT imaging of her head and neck as well as x-ray of her clavicle and these were all negative for acute injury.  She had elevated blood pressure in the ER.  No medication changes were made.  After going home she continued to check her blood pressure and noticed systolics still elevated.  I asked her to come in today for recheck.  She feels well.  No pain in head, neck, shoulders, or extremities. She has a small superficial laceration that is healing on top of her hand as well as on left ankle. Scalp without bleeding at the site of the laceration.  Staples intact. Home blood pressures ranging in the 481E to 563J systolic, 49F to 02O diastolic.  No headaches, vision complaints, dizziness, chest pain, shortness of breath, or palpitations. She has a small amount of swelling in her legs lately.  Past Medical History:  Diagnosis Date   Allergic rhinitis    with upper airway cough: as of 07/2017 pulm f/u she was instructed to use astelin, flonase, and saline more consistently   BPPV (benign paroxysmal positional vertigo) 03/2018   Chest pain, non-cardiac    Cardiac CT showed no signif obstructive dz, mildly elevated calcium level (Dr. [name]).   Chronic cystitis with hematuria    [name]   Chronic hypoxemic respiratory failure ([place]) 02/02/2015   2L oxygen 24/7   Chronic renal insufficiency, stage 2 (mild)    GFR approx 60   Cold hands    NCS/EMGs normal.  Hand dysesthesias per neuro--reassured    COPD (chronic obstructive pulmonary disease) ([place])    GOLD 4.  spirometry 01/10/09 FEV 0.97(52%), FEV1% 47.  With chronic bronchitis as of 07/2017.   DDD (degenerative disc disease), cervical    1998 MRI C5-6 impingemt (left).  MRI 2021 (neurol) 1 level canal stenosis, 1 level foraminal stenosis--->PT   DDD (degenerative disc disease), lumbar    Depression with anxiety 04/15/2011   Diarrheal disease Summer 2017   ? 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).  As of 10/05/15 pt reports GI dx'd her with C diff and rx'd more flagyl and she is also on cholestyramine.  As of 02/2016, pt's sx's resolved completely.   Diverticulosis of colon    Procto '97 and colonoscopy 2002   Dysplastic nevus of upper extremity 04/2014   R tricep ([name])   Edema of both lower extremities due to peripheral venous insufficiency    Varicosities bilat, swelling L>R.  R baker's cyst.  Got vein clinic eval 02/2018->sclerotherapy recommended but since no hemorrhage or ulceration, insurance will not cover the procedure.   Family history of adverse reaction to anesthesia    mother died when patient age 34 receiving anesthesia for thyroid surgery   Fibrocystic breast disease    w/fibroadenoma.  Bx's showed NO atypia (Dr. Margot Chimes).  Mammo neg 2008.  GERD (gastroesophageal reflux disease)    History of double vision    Ophthalmologist, Dr. Herbert Deaner, is further evaluating this with MRI  orbits and limited brain (myesthenia gravis testing neg 07/2010)   History of home oxygen therapy    uses 2 liters at hs   Hypertension    EKG 03/2010 normal   Hypothyroidism    Hashimoto's, dx'd 1989   Idiopathic peripheral neuropathy 04/2018   Sensory (numb feet) S1 distribution bilat, c/w spinal stenosis sensory neuropathy (no pain). 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  Labs  [place], MRI C spine->some spondylosis/stenosis.  UE NCS/EMS: normal 04/2019.   Lesion of right native kidney 2017   found on CT 02/2015.  F/u MRI 03/31/15 showed it to be smaller and less likely of any significance but repeat CT renal protocol in 59mo was recommended by radiology.  This was done 10/26/15 and showed no interval change in the lesion, but b/c the lesion enhances with contrast, radiol rec'd urol consult (b/c pap renal RCC could not be excluded).  Saw urol 03/06/16--stable on u/s 09/2016 and 09/2017.   OAB (overactive bladder)    Osteoarthritis, hip, bilateral    she is s/p bilat THA as of 02/2018.   Osteoporosis    radius, T score -2.5->rec'd alendronate 08/2019->plan rpt DEXA 08/2021.   Pneumonia    Postmenopausal atrophic vaginitis    improved with estrace 2 X/week.   Prolapse of vaginal cuff after hysterectomy    10/2018: Dr. Wayne Both. Mild colpocele 04/2019 per GYN (no surgery)   Pulmonary nodule    CT chest 12/10, June 2011.  No nodule on CT 06/2010.   Recurrent Clostridium difficile diarrhea    Episode 09/19/2016: resolved with prolonged course of flagyl.  11/2016 recurrence treated with 10d of Dificid (Fidaxomicin) by GI (Dr. Earlean Shawl).   Recurrent UTI    likely related to pt's atrophic vaginitis.  Urol started vaginal estrace 03/2016.   Rib fracture 09/2018   R 7th, laterally-->sustained when her dog pulled her over and she fell.   RLS (restless legs syndrome)    neuro->gabapentin trial 2020/21   Shingles 02/2014   R abd/flank   Spinal stenosis of lumbar region with neurogenic claudication    2008 MRI (L3-4, L4-5, L5-S1).  +S1 distribution sensory loss bilat    Past Surgical History:  Procedure Laterality Date   ABDOMINAL HYSTERECTOMY  1978   no BSO per pt--nonmalignant reasons   APPENDECTOMY     BREAST CYST EXCISION     Last screening mammogram 10/2010 was normal.   CHOLECYSTECTOMY  2001   COLONOSCOPY  04/2005; 12/06/2015   2007 NORMAL.  2017 adenomatous polyp x 1.  Mod sigmoid  diverticulosis (Dr. Earlean Shawl).     DEXA  05/18/2017; 08/7019   2019 NORMAL (T-score 0.1).  08/2019->T score -2.5->alendronate started->plan Rpt 08/2021.   HIP CLOSED REDUCTION Right 12/25/2015   Procedure: CLOSED REDUCTION HIP;  Surgeon: Nicholes Stairs, MD;  Location: [facility];  Service: Orthopedics;  Laterality: Right;   NCV/EMS  10/2018   chronic and symmetric sensorimotor axonal polyneuropathy affecting the lower extremities   TONSILLECTOMY     TOTAL HIP ARTHROPLASTY Right 11/01/2014   Procedure: RIGHT TOTAL HIP ARTHROPLASTY;  Surgeon: Latanya Maudlin, MD;  Location: [facility];  Service: Orthopedics;  Laterality: Right;   TOTAL HIP ARTHROPLASTY Left 12/08/2017   Procedure: LEFT TOTAL HIP ARTHROPLASTY ANTERIOR APPROACH;  Surgeon: Paralee Cancel, MD;  Location: [facility];  Service: Orthopedics;  Laterality: Left;  70 mins   TRANSTHORACIC ECHOCARDIOGRAM  08/2014   EF 60-65%, grade I diast dysfxn   TUBAL LIGATION  1974    Outpatient Medications Prior to Visit  Medication Sig Dispense Refill   albuterol (PROAIR HFA) 108 (90 Base) MCG/ACT inhaler Inhale 2 puffs into the lungs every 4 (four) hours as needed for wheezing or shortness of breath. 8.5 g 1   alendronate (FOSAMAX) 70 MG tablet TAKE 1 TABLET(70 MG) BY MOUTH 1 TIME A WEEK WITH A FULL GLASS OF WATER AND ON AN EMPTY STOMACH 12 tablet 3   amLODipine (NORVASC) 5 MG tablet TAKE 1 TABLET(5 MG) BY MOUTH DAILY 90 tablet 3   Artificial Tear Solution (SOOTHE XP) SOLN Place 1 drop into both eyes 2 (two) times daily as needed (irritation).     aspirin EC 81 MG tablet Take 81 mg by mouth daily.     clobetasol (TEMOVATE) 0.05 % external solution APPLY TO THE AFFECTED AREA EVERY DAY 50 mL 4   diazepam (VALIUM) 5 MG tablet TAKE 1 TABLET BY MOUTH DAILY AS NEEDED FOR ANXIETY AND INSOMNIA 30 tablet 5   fluorometholone (FML) 0.1 % ophthalmic suspension Place 1 drop into both eyes as directed. Tapering off     Fluticasone-Umeclidin-Vilant (TRELEGY ELLIPTA)  100-62.5-25 MCG/INH AEPB Inhale 1 puff into the lungs daily. 60 each 6   Fluticasone-Umeclidin-Vilant (TRELEGY ELLIPTA) 100-62.5-25 MCG/INH AEPB Inhale 1 puff into the lungs daily. 14 each 0   furosemide (LASIX) 20 MG tablet TAKE 1 TABLET BY MOUTH EVERY DAY AS NEEDED FOR INCREASED FLUID COLLECTION IN LEGS 30 tablet 3   nitrofurantoin (MACRODANTIN) 50 MG capsule Take 1 capsule (50 mg total) by mouth at bedtime. 90 capsule 3   Probiotic Product (PROBIOTIC DAILY PO) Take 1 capsule by mouth daily.      roflumilast (DALIRESP) 500 MCG TABS tablet Take 1 tablet (500 mcg total) by mouth daily. 90 tablet 3   SYNTHROID 88 MCG tablet TAKE 1 TABLET(88 MCG) BY MOUTH DAILY 30 tablet 1   venlafaxine (EFFEXOR) 50 MG tablet TAKE 1 TABLET BY MOUTH TWICE DAILY 180 tablet 1   VISINE 0.05 % ophthalmic solution 1 drop  as needed     vitamin B-12 (CYANOCOBALAMIN) 100 MCG tablet Take 100 mcg by mouth daily.     VITAMIN D PO Take by mouth.     azelastine (ASTELIN) 0.1 % nasal spray Place 2 sprays into both nostrils 2 (two) times daily. Use in each nostril as directed (Patient not taking: No sig reported) 30 mL 3   cetirizine (ZYRTEC) 10 MG tablet Take 1 tablet (10 mg total) by mouth daily. (Patient not taking: No sig reported) 30 tablet 6   fluticasone (FLONASE) 50 MCG/ACT nasal spray Place 2 sprays into both nostrils daily. (Patient not taking: No sig reported) 16 g 6   triamcinolone cream (KENALOG) 0.1 % Apply 1 application topically 2 (two) times daily. (Patient not taking: No sig reported) 45 g 2   Vibegron (GEMTESA) 75 MG TABS Take 1 capsule by mouth daily. (Patient not taking: No sig reported) 30 tablet 11   No facility-administered medications prior to visit.    Allergies  Allergen Reactions   Losartan Other (See Comments)    hyperkalemia   Penicillins Hives, Swelling and Rash    Has patient had a PCN reaction causing immediate rash, facial/tongue/throat swelling, SOB or lightheadedness with hypotension:  YES Has patient had a PCN reaction causing severe rash involving mucus membranes or skin necrosis: NO  Has patient had a PCN reaction that required hospitalization NO Has patient had a PCN reaction occurring within the last 10 years: NO If all of the above answers are "NO", then may proceed with Cephalosporin use.    Cefixime [Kdc:Cefixime] Other (See Comments)    unspecified   Gabapentin Other (See Comments)    Feels woozy   Indocin [Indomethacin] Other (See Comments)    Painful tongue and throat   Singulair [Montelukast Sodium]     Chest tightness, decreased mental clarity   Sulfa Antibiotics Other (See Comments)    unspecified   Ace Inhibitors Other (See Comments)    cough   Statins Other (See Comments)    Myalgias     ROS As per HPI  PE: Vitals with BMI 12/13/2020 12/11/2020 12/11/2020  Height 5\' 0"  - -  Weight 128 lbs 3 oz - -  BMI 23.55 - -  Systolic 732 202 542  Diastolic 75 77 77  Pulse 58 57 60  Some encounter information is confidential and restricted. Go to Review Flowsheets activity to see all data.     Gen: Alert, well appearing.  Patient is oriented to person, place, time, and situation. AFFECT: pleasant, lucid thought and speech. Scalp 2 cm lac R frontoparietal area with edges well approximated and staples intact. CV: RRR, no m/r/g.   LUNGS: CTA bilat, nonlabored resps, good aeration in all lung fields. EXT: no clubbing or cyanosis.  1+ bilat LL pitting edema (ankles).    LABS:    Chemistry      Component Value Date/Time   NA 140 12/07/2020 1447   K 4.0 12/07/2020 1447   CL 105 12/07/2020 1447   CO2 22 12/07/2020 1447   BUN 18 12/07/2020 1447   CREATININE 0.83 12/07/2020 1447      Component Value Date/Time   CALCIUM 9.4 12/07/2020 1447   ALKPHOS 105 05/09/2020 1347   AST 14 12/07/2020 1447   ALT 10 12/07/2020 1447   BILITOT 0.5 12/07/2020 1447      IMPRESSION AND PLAN:  #1 scalp laceration.  She does not seem to have sustained anything  else of significance in this fall, fortunately. Will take the staples out when I see her early next week.  2.:  Hypertension, elevated blood pressures last couple of days.  Would not be unusual given the excitement of the situation for this to be simply reactive elevation of blood pressure.  However we will go ahead and have her increase her amlodipine to 1-1/2 of the 5 mg tabs per day.  Continue to monitor blood pressure at home and we will review these early next week.  An After Visit Summary was printed and given to the patient.  FOLLOW UP: No follow-ups on file.  Signed:  Crissie Sickles, MD           12/13/2020

## 2020-12-18 ENCOUNTER — Other Ambulatory Visit: Payer: Self-pay

## 2020-12-18 ENCOUNTER — Ambulatory Visit: Payer: Medicare Other | Admitting: Family Medicine

## 2020-12-18 ENCOUNTER — Ambulatory Visit (INDEPENDENT_AMBULATORY_CARE_PROVIDER_SITE_OTHER): Payer: Medicare Other | Admitting: Family Medicine

## 2020-12-18 ENCOUNTER — Encounter: Payer: Self-pay | Admitting: Family Medicine

## 2020-12-18 VITALS — BP 125/69 | HR 73 | Temp 98.3°F | Ht 60.0 in | Wt 129.2 lb

## 2020-12-18 DIAGNOSIS — I10 Essential (primary) hypertension: Secondary | ICD-10-CM | POA: Diagnosis not present

## 2020-12-18 DIAGNOSIS — Z4802 Encounter for removal of sutures: Secondary | ICD-10-CM

## 2020-12-18 DIAGNOSIS — S0101XD Laceration without foreign body of scalp, subsequent encounter: Secondary | ICD-10-CM

## 2020-12-18 NOTE — Progress Notes (Deleted)
OFFICE VISIT  12/18/2020  CC: No chief complaint on file.   HPI:    Patient is a 85 y.o. female who presents for 5d f/u HTN and scalp laceration. A/P as of last visit: "#1 scalp laceration.  She does not seem to have sustained anything else of significance in this fall, fortunately. Will take the staples out when I see her early next week.  2.:  Hypertension, elevated blood pressures last couple of days.  Would not be unusual given the excitement of the situation for this to be simply reactive elevation of blood pressure.  However we will go ahead and have her increase her amlodipine to 1-1/2 of the 5 mg tabs per day.  Continue to monitor blood pressure at home and we will review these early next week."  INTERIM HX: ***    Past Medical History:  Diagnosis Date   Allergic rhinitis    with upper airway cough: as of 07/2017 pulm f/u she was instructed to use astelin, flonase, and saline more consistently   BPPV (benign paroxysmal positional vertigo) 03/2018   Chest pain, non-cardiac    Cardiac CT showed no signif obstructive dz, mildly elevated calcium level (Dr. [name]).   Chronic cystitis with hematuria    [name]   Chronic hypoxemic respiratory failure ([place]) 02/02/2015   2L oxygen 24/7   Chronic renal insufficiency, stage 2 (mild)    GFR approx 60   Cold hands    NCS/EMGs normal.  Hand dysesthesias per neuro--reassured   COPD (chronic obstructive pulmonary disease) ([place])    GOLD 4.  spirometry 01/10/09 FEV 0.97(52%), FEV1% 47.  With chronic bronchitis as of 07/2017.   DDD (degenerative disc disease), cervical    1998 MRI C5-6 impingemt (left).  MRI 2021 (neurol) 1 level canal stenosis, 1 level foraminal stenosis--->PT   DDD (degenerative disc disease), lumbar    Depression with anxiety 04/15/2011   Diarrheal disease Summer 2017   ? 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).  As of 10/05/15 pt reports GI dx'd her with C diff and rx'd more flagyl and she is also on cholestyramine.  As of 02/2016, pt's sx's resolved completely.   Diverticulosis of colon    Procto '97 and colonoscopy 2002   Dysplastic nevus of upper extremity 04/2014   R tricep ([name])   Edema of both lower extremities due to peripheral venous insufficiency    Varicosities bilat, swelling L>R.  R baker's cyst.  Got vein clinic eval 02/2018->sclerotherapy recommended but since no hemorrhage or ulceration, insurance will not cover the procedure.   Family history of adverse reaction to anesthesia    mother died when patient age 36 receiving anesthesia for thyroid surgery   Fibrocystic breast disease    w/fibroadenoma.  Bx's showed NO atypia (Dr. Margot Chimes).  Mammo neg 2008.   GERD (gastroesophageal reflux disease)    History of double vision    Ophthalmologist, Dr. Herbert Deaner, is further evaluating this with MRI  orbits and limited brain (myesthenia gravis testing neg 07/2010)   History of home oxygen therapy    uses 2 liters at hs   Hypertension    EKG 03/2010 normal   Hypothyroidism    Hashimoto's, dx'd 1989   Idiopathic peripheral neuropathy 04/2018   Sensory (numb feet) S1 distribution bilat, c/w spinal stenosis sensory neuropathy (no pain). 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  Labs  [place], MRI C spine->some spondylosis/stenosis.  UE NCS/EMS: normal 04/2019.   Lesion of right native kidney 2017   found on CT 02/2015.  F/u MRI 03/31/15 showed it to be smaller and less likely of any significance but repeat CT renal protocol in 20mo was recommended by radiology.  This was done 10/26/15 and showed no interval change in the lesion, but b/c the lesion enhances with contrast, radiol rec'd urol consult (b/c pap renal RCC could not be excluded).  Saw urol 03/06/16--stable on u/s 09/2016 and 09/2017.   OAB (overactive bladder)     Osteoarthritis, hip, bilateral    she is s/p bilat THA as of 02/2018.   Osteoporosis    radius, T score -2.5->rec'd alendronate 08/2019->plan rpt DEXA 08/2021.   Pneumonia    Postmenopausal atrophic vaginitis    improved with estrace 2 X/week.   Prolapse of vaginal cuff after hysterectomy    10/2018: Dr. Wayne Both. Mild colpocele 04/2019 per GYN (no surgery)   Pulmonary nodule    CT chest 12/10, June 2011.  No nodule on CT 06/2010.   Recurrent Clostridium difficile diarrhea    Episode 09/19/2016: resolved with prolonged course of flagyl.  11/2016 recurrence treated with 10d of Dificid (Fidaxomicin) by GI (Dr. Earlean Shawl).   Recurrent UTI    likely related to pt's atrophic vaginitis.  Urol started vaginal estrace 03/2016.   Rib fracture 09/2018   R 7th, laterally-->sustained when her dog pulled her over and she fell.   RLS (restless legs syndrome)    neuro->gabapentin trial 2020/21   Shingles 02/2014   R abd/flank   Spinal stenosis of lumbar region with neurogenic claudication    2008 MRI (L3-4, L4-5, L5-S1).  +S1 distribution sensory loss bilat    Past Surgical History:  Procedure Laterality Date   ABDOMINAL HYSTERECTOMY  1978   no BSO per pt--nonmalignant reasons   APPENDECTOMY     BREAST CYST EXCISION     Last screening mammogram 10/2010 was normal.   CHOLECYSTECTOMY  2001   COLONOSCOPY  04/2005; 12/06/2015   2007 NORMAL.  2017 adenomatous polyp x 1.  Mod sigmoid diverticulosis (Dr. Earlean Shawl).     DEXA  05/18/2017; 08/7019   2019 NORMAL (T-score 0.1).  08/2019->T score -2.5->alendronate started->plan Rpt 08/2021.   HIP CLOSED REDUCTION Right 12/25/2015   Procedure: CLOSED REDUCTION HIP;  Surgeon: Nicholes Stairs, MD;  Location: [facility];  Service: Orthopedics;  Laterality: Right;   NCV/EMS  10/2018   chronic and symmetric sensorimotor axonal polyneuropathy affecting the lower extremities   TONSILLECTOMY     TOTAL HIP ARTHROPLASTY Right 11/01/2014   Procedure: RIGHT TOTAL HIP ARTHROPLASTY;   Surgeon: Latanya Maudlin, MD;  Location: [facility];  Service: Orthopedics;  Laterality: Right;   TOTAL HIP ARTHROPLASTY Left 12/08/2017   Procedure: LEFT TOTAL HIP ARTHROPLASTY ANTERIOR APPROACH;  Surgeon: Paralee Cancel, MD;  Location: [facility];  Service: Orthopedics;  Laterality: Left;  70 mins   TRANSTHORACIC ECHOCARDIOGRAM  08/2014   EF 60-65%, grade I diast dysfxn   TUBAL LIGATION  1974    Outpatient Medications Prior to Visit  Medication Sig Dispense Refill   albuterol (PROAIR HFA) 108 (90 Base) MCG/ACT inhaler Inhale 2 puffs into the lungs every 4 (four) hours as needed for wheezing or shortness of breath. 8.5 g 1   alendronate (FOSAMAX) 70 MG tablet TAKE 1 TABLET(70 MG) BY MOUTH 1 TIME A WEEK WITH A FULL GLASS OF WATER AND ON AN EMPTY STOMACH 12 tablet 3  amLODipine (NORVASC) 5 MG tablet TAKE 1 TABLET(5 MG) BY MOUTH DAILY 90 tablet 3   Artificial Tear Solution (SOOTHE XP) SOLN Place 1 drop into both eyes 2 (two) times daily as needed (irritation).     aspirin EC 81 MG tablet Take 81 mg by mouth daily.     azelastine (ASTELIN) 0.1 % nasal spray Place 2 sprays into both nostrils 2 (two) times daily. Use in each nostril as directed (Patient not taking: No sig reported) 30 mL 3   cetirizine (ZYRTEC) 10 MG tablet Take 1 tablet (10 mg total) by mouth daily. (Patient not taking: No sig reported) 30 tablet 6   clobetasol (TEMOVATE) 0.05 % external solution APPLY TO THE AFFECTED AREA EVERY DAY 50 mL 4   diazepam (VALIUM) 5 MG tablet TAKE 1 TABLET BY MOUTH DAILY AS NEEDED FOR ANXIETY AND INSOMNIA 30 tablet 5   fluorometholone (FML) 0.1 % ophthalmic suspension Place 1 drop into both eyes as directed. Tapering off     fluticasone (FLONASE) 50 MCG/ACT nasal spray Place 2 sprays into both nostrils daily. (Patient not taking: No sig reported) 16 g 6   Fluticasone-Umeclidin-Vilant (TRELEGY ELLIPTA) 100-62.5-25 MCG/INH AEPB Inhale 1 puff into the lungs daily. 60 each 6   Fluticasone-Umeclidin-Vilant (TRELEGY  ELLIPTA) 100-62.5-25 MCG/INH AEPB Inhale 1 puff into the lungs daily. 14 each 0   furosemide (LASIX) 20 MG tablet TAKE 1 TABLET BY MOUTH EVERY DAY AS NEEDED FOR INCREASED FLUID COLLECTION IN LEGS 30 tablet 3   nitrofurantoin (MACRODANTIN) 50 MG capsule Take 1 capsule (50 mg total) by mouth at bedtime. 90 capsule 3   Probiotic Product (PROBIOTIC DAILY PO) Take 1 capsule by mouth daily.      roflumilast (DALIRESP) 500 MCG TABS tablet Take 1 tablet (500 mcg total) by mouth daily. 90 tablet 3   SYNTHROID 88 MCG tablet TAKE 1 TABLET(88 MCG) BY MOUTH DAILY 30 tablet 1   triamcinolone cream (KENALOG) 0.1 % Apply 1 application topically 2 (two) times daily. (Patient not taking: No sig reported) 45 g 2   venlafaxine (EFFEXOR) 50 MG tablet TAKE 1 TABLET BY MOUTH TWICE DAILY 180 tablet 1   Vibegron (GEMTESA) 75 MG TABS Take 1 capsule by mouth daily. (Patient not taking: No sig reported) 30 tablet 11   VISINE 0.05 % ophthalmic solution 1 drop  as needed     vitamin B-12 (CYANOCOBALAMIN) 100 MCG tablet Take 100 mcg by mouth daily.     VITAMIN D PO Take by mouth.     No facility-administered medications prior to visit.    Allergies  Allergen Reactions   Losartan Other (See Comments)    hyperkalemia   Penicillins Hives, Swelling and Rash    Has patient had a PCN reaction causing immediate rash, facial/tongue/throat swelling, SOB or lightheadedness with hypotension: YES Has patient had a PCN reaction causing severe rash involving mucus membranes or skin necrosis: NO Has patient had a PCN reaction that required hospitalization NO Has patient had a PCN reaction occurring within the last 10 years: NO If all of the above answers are "NO", then may proceed with Cephalosporin use.    Cefixime [Kdc:Cefixime] Other (See Comments)    unspecified   Gabapentin Other (See Comments)    Feels woozy   Indocin [Indomethacin] Other (See Comments)    Painful tongue and throat   Singulair [Montelukast Sodium]      Chest tightness, decreased mental clarity   Sulfa Antibiotics Other (See Comments)    unspecified  Ace Inhibitors Other (See Comments)    cough   Statins Other (See Comments)    Myalgias     ROS As per HPI  PE: Vitals with BMI 12/13/2020 12/11/2020 12/11/2020  Height 5\' 0"  - -  Weight 128 lbs 3 oz - -  BMI 26.37 - -  Systolic 858 850 277  Diastolic 75 77 77  Pulse 58 57 60  Some encounter information is confidential and restricted. Go to Review Flowsheets activity to see all data.     ***  LABS:    Chemistry      Component Value Date/Time   NA 140 12/07/2020 1447   K 4.0 12/07/2020 1447   CL 105 12/07/2020 1447   CO2 22 12/07/2020 1447   BUN 18 12/07/2020 1447   CREATININE 0.83 12/07/2020 1447      Component Value Date/Time   CALCIUM 9.4 12/07/2020 1447   ALKPHOS 105 05/09/2020 1347   AST 14 12/07/2020 1447   ALT 10 12/07/2020 1447   BILITOT 0.5 12/07/2020 1447       IMPRESSION AND PLAN:  No problem-specific Assessment & Plan notes found for this encounter.   An After Visit Summary was printed and given to the patient.  FOLLOW UP: No follow-ups on file. Has 03/11/21 appt  Signed:  Crissie Sickles, MD           12/18/2020

## 2020-12-18 NOTE — Progress Notes (Signed)
OFFICE VISIT  12/18/2020  CC:  Chief Complaint  Patient presents with   Staple Removal    HPI:    Patient is a 85 y.o. female who presents for 5 d f/u scalp lac and elevated bp's. A/P as of last visit: "1 scalp laceration.  She does not seem to have sustained anything else of significance in this fall, fortunately. Will take the staples out when I see her early next week.  2.:  Hypertension, elevated blood pressures last couple of days.  Would not be unusual given the excitement of the situation for this to be simply reactive elevation of blood pressure.  However we will go ahead and have her increase her amlodipine to 1-1/2 of the 5 mg tabs per day.  Continue to monitor blood pressure at home and we will review these early next week."  INTERIM HX: Doing well, no new problems.  Home blood pressure checks consistently less than 130/80, no problems with the increased dose of amlodipine.  Past Medical History:  Diagnosis Date   Allergic rhinitis    with upper airway cough: as of 07/2017 pulm f/u she was instructed to use astelin, flonase, and saline more consistently   BPPV (benign paroxysmal positional vertigo) 03/2018   Chest pain, non-cardiac    Cardiac CT showed no signif obstructive dz, mildly elevated calcium level (Dr. [name]).   Chronic cystitis with hematuria    [name]   Chronic hypoxemic respiratory failure ([place]) 02/02/2015   2L oxygen 24/7   Chronic renal insufficiency, stage 2 (mild)    GFR approx 60   Cold hands    NCS/EMGs normal.  Hand dysesthesias per neuro--reassured   COPD (chronic obstructive pulmonary disease) ([place])    GOLD 4.  spirometry 01/10/09 FEV 0.97(52%), FEV1% 47.  With chronic bronchitis as of 07/2017.   DDD (degenerative disc disease), cervical    1998 MRI C5-6 impingemt (left).  MRI 2021 (neurol) 1 level canal stenosis, 1 level foraminal stenosis--->PT   DDD (degenerative disc disease), lumbar    Depression with anxiety 04/15/2011    Diarrheal disease Summer 2017   ? 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).  As of 10/05/15 pt reports GI dx'd her with C diff and rx'd more flagyl and she is also on cholestyramine.  As of 02/2016, pt's sx's resolved completely.   Diverticulosis of colon    Procto '97 and colonoscopy 2002   Dysplastic nevus of upper extremity 04/2014   R tricep ([name])   Edema of both lower extremities due to peripheral venous insufficiency    Varicosities bilat, swelling L>R.  R baker's cyst.  Got vein clinic eval 02/2018->sclerotherapy recommended but since no hemorrhage or ulceration, insurance will not cover the procedure.   Family history of adverse reaction to anesthesia    mother died when patient age 66 receiving anesthesia for thyroid surgery   Fibrocystic breast disease    w/fibroadenoma.  Bx's showed NO atypia (Dr. Margot Chimes).  Mammo neg 2008.   GERD (gastroesophageal reflux disease)    History of double vision    Ophthalmologist, Dr. Herbert Deaner, is further evaluating this with MRI  orbits and limited brain (myesthenia gravis testing neg 07/2010)   History of home oxygen therapy    uses 2 liters at hs   Hypertension    EKG 03/2010 normal   Hypothyroidism    Hashimoto's, dx'd 1989   Idiopathic peripheral neuropathy 04/2018  Sensory (numb feet) S1 distribution bilat, c/w spinal stenosis sensory neuropathy (no pain). 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  Labs Myra, MRI C spine->some spondylosis/stenosis.  UE NCS/EMS: normal 04/2019.   Lesion of right native kidney 2017   found on CT 02/2015.  F/u MRI 03/31/15 showed it to be smaller and less likely of any significance but repeat CT renal protocol in 14mo was recommended by radiology.  This was done 10/26/15 and showed no interval change in the lesion, but b/c the lesion enhances with  contrast, radiol rec'd urol consult (b/c pap renal RCC could not be excluded).  Saw urol 03/06/16--stable on u/s 09/2016 and 09/2017.   OAB (overactive bladder)    Osteoarthritis, hip, bilateral    she is s/p bilat THA as of 02/2018.   Osteoporosis    radius, T score -2.5->rec'd alendronate 08/2019->plan rpt DEXA 08/2021.   Pneumonia    Postmenopausal atrophic vaginitis    improved with estrace 2 X/week.   Prolapse of vaginal cuff after hysterectomy    10/2018: Dr. Wayne Both. Mild colpocele 04/2019 per GYN (no surgery)   Pulmonary nodule    CT chest 12/10, June 2011.  No nodule on CT 06/2010.   Recurrent Clostridium difficile diarrhea    Episode 09/19/2016: resolved with prolonged course of flagyl.  11/2016 recurrence treated with 10d of Dificid (Fidaxomicin) by GI (Dr. Earlean Shawl).   Recurrent UTI    likely related to pt's atrophic vaginitis.  Urol started vaginal estrace 03/2016.   Rib fracture 09/2018   R 7th, laterally-->sustained when her dog pulled her over and she fell.   RLS (restless legs syndrome)    neuro->gabapentin trial 2020/21   Shingles 02/2014   R abd/flank   Spinal stenosis of lumbar region with neurogenic claudication    2008 MRI (L3-4, L4-5, L5-S1).  +S1 distribution sensory loss bilat    Past Surgical History:  Procedure Laterality Date   ABDOMINAL HYSTERECTOMY  1978   no BSO per pt--nonmalignant reasons   APPENDECTOMY     BREAST CYST EXCISION     Last screening mammogram 10/2010 was normal.   CHOLECYSTECTOMY  2001   COLONOSCOPY  04/2005; 12/06/2015   2007 NORMAL.  2017 adenomatous polyp x 1.  Mod sigmoid diverticulosis (Dr. Earlean Shawl).     DEXA  05/18/2017; 08/7019   2019 NORMAL (T-score 0.1).  08/2019->T score -2.5->alendronate started->plan Rpt 08/2021.   HIP CLOSED REDUCTION Right 12/25/2015   Procedure: CLOSED REDUCTION HIP;  Surgeon: Nicholes Stairs, MD;  Location: [facility];  Service: Orthopedics;  Laterality: Right;   NCV/EMS  10/2018   chronic and symmetric sensorimotor  axonal polyneuropathy affecting the lower extremities   TONSILLECTOMY     TOTAL HIP ARTHROPLASTY Right 11/01/2014   Procedure: RIGHT TOTAL HIP ARTHROPLASTY;  Surgeon: Latanya Maudlin, MD;  Location: [facility];  Service: Orthopedics;  Laterality: Right;   TOTAL HIP ARTHROPLASTY Left 12/08/2017   Procedure: LEFT TOTAL HIP ARTHROPLASTY ANTERIOR APPROACH;  Surgeon: Paralee Cancel, MD;  Location: [facility];  Service: Orthopedics;  Laterality: Left;  70 mins   TRANSTHORACIC ECHOCARDIOGRAM  08/2014   EF 60-65%, grade I diast dysfxn   TUBAL LIGATION  1974    Outpatient Medications Prior to Visit  Medication Sig Dispense Refill   albuterol (PROAIR HFA) 108 (90 Base) MCG/ACT inhaler Inhale 2 puffs into the lungs every 4 (four) hours as needed for wheezing or shortness of breath. 8.5 g 1   alendronate (FOSAMAX) 70 MG tablet  TAKE 1 TABLET(70 MG) BY MOUTH 1 TIME A WEEK WITH A FULL GLASS OF WATER AND ON AN EMPTY STOMACH 12 tablet 3   amLODipine (NORVASC) 5 MG tablet TAKE 1 TABLET(5 MG) BY MOUTH DAILY 90 tablet 3   Artificial Tear Solution (SOOTHE XP) SOLN Place 1 drop into both eyes 2 (two) times daily as needed (irritation).     aspirin EC 81 MG tablet Take 81 mg by mouth daily.     clobetasol (TEMOVATE) 0.05 % external solution APPLY TO THE AFFECTED AREA EVERY DAY 50 mL 4   diazepam (VALIUM) 5 MG tablet TAKE 1 TABLET BY MOUTH DAILY AS NEEDED FOR ANXIETY AND INSOMNIA 30 tablet 5   fluorometholone (FML) 0.1 % ophthalmic suspension Place 1 drop into both eyes as directed. Tapering off     Fluticasone-Umeclidin-Vilant (TRELEGY ELLIPTA) 100-62.5-25 MCG/INH AEPB Inhale 1 puff into the lungs daily. 60 each 6   Fluticasone-Umeclidin-Vilant (TRELEGY ELLIPTA) 100-62.5-25 MCG/INH AEPB Inhale 1 puff into the lungs daily. 14 each 0   furosemide (LASIX) 20 MG tablet TAKE 1 TABLET BY MOUTH EVERY DAY AS NEEDED FOR INCREASED FLUID COLLECTION IN LEGS 30 tablet 3   nitrofurantoin (MACRODANTIN) 50 MG capsule Take 1 capsule (50 mg  total) by mouth at bedtime. 90 capsule 3   Probiotic Product (PROBIOTIC DAILY PO) Take 1 capsule by mouth daily.      roflumilast (DALIRESP) 500 MCG TABS tablet Take 1 tablet (500 mcg total) by mouth daily. 90 tablet 3   SYNTHROID 88 MCG tablet TAKE 1 TABLET(88 MCG) BY MOUTH DAILY 30 tablet 1   venlafaxine (EFFEXOR) 50 MG tablet TAKE 1 TABLET BY MOUTH TWICE DAILY 180 tablet 1   VISINE 0.05 % ophthalmic solution 1 drop  as needed     vitamin B-12 (CYANOCOBALAMIN) 100 MCG tablet Take 100 mcg by mouth daily.     VITAMIN D PO Take by mouth.     azelastine (ASTELIN) 0.1 % nasal spray Place 2 sprays into both nostrils 2 (two) times daily. Use in each nostril as directed (Patient not taking: No sig reported) 30 mL 3   cetirizine (ZYRTEC) 10 MG tablet Take 1 tablet (10 mg total) by mouth daily. (Patient not taking: No sig reported) 30 tablet 6   fluticasone (FLONASE) 50 MCG/ACT nasal spray Place 2 sprays into both nostrils daily. (Patient not taking: No sig reported) 16 g 6   triamcinolone cream (KENALOG) 0.1 % Apply 1 application topically 2 (two) times daily. (Patient not taking: No sig reported) 45 g 2   Vibegron (GEMTESA) 75 MG TABS Take 1 capsule by mouth daily. (Patient not taking: No sig reported) 30 tablet 11   No facility-administered medications prior to visit.    Allergies  Allergen Reactions   Losartan Other (See Comments)    hyperkalemia   Penicillins Hives, Swelling and Rash    Has patient had a PCN reaction causing immediate rash, facial/tongue/throat swelling, SOB or lightheadedness with hypotension: YES Has patient had a PCN reaction causing severe rash involving mucus membranes or skin necrosis: NO Has patient had a PCN reaction that required hospitalization NO Has patient had a PCN reaction occurring within the last 10 years: NO If all of the above answers are "NO", then may proceed with Cephalosporin use.    Cefixime [Kdc:Cefixime] Other (See Comments)    unspecified    Gabapentin Other (See Comments)    Feels woozy   Indocin [Indomethacin] Other (See Comments)    Painful tongue and  throat   Singulair [Montelukast Sodium]     Chest tightness, decreased mental clarity   Sulfa Antibiotics Other (See Comments)    unspecified   Ace Inhibitors Other (See Comments)    cough   Statins Other (See Comments)    Myalgias     ROS As per HPI  PE: Vitals with BMI 12/18/2020 12/13/2020 12/11/2020  Height 5\' 0"  5\' 0"  -  Weight 129 lbs 3 oz 128 lbs 3 oz -  BMI 92.44 62.86 -  Systolic 381 771 165  Diastolic 69 75 77  Pulse 73 58 57  Some encounter information is confidential and restricted. Go to Review Flowsheets activity to see all data.     Scalp: healing 2cm lac on R parietal scalp region w/out erythema or swelling. Edges well-approximated.  2 staples in place-->removed today with staple remover.  LABS:    Chemistry      Component Value Date/Time   NA 140 12/07/2020 1447   K 4.0 12/07/2020 1447   CL 105 12/07/2020 1447   CO2 22 12/07/2020 1447   BUN 18 12/07/2020 1447   CREATININE 0.83 12/07/2020 1447      Component Value Date/Time   CALCIUM 9.4 12/07/2020 1447   ALKPHOS 105 05/09/2020 1347   AST 14 12/07/2020 1447   ALT 10 12/07/2020 1447   BILITOT 0.5 12/07/2020 1447      IMPRESSION AND PLAN:  1) hypertension, now well controlled on amlodipine 7.5 mg a day.  2.  Scalp laceration healing well.  Tables x2 removed today without problem.  An After Visit Summary was printed and given to the patient.  FOLLOW UP: No follow-ups on file.  Signed:  Crissie Sickles, MD           12/18/2020

## 2020-12-19 DIAGNOSIS — Z23 Encounter for immunization: Secondary | ICD-10-CM | POA: Diagnosis not present

## 2020-12-21 ENCOUNTER — Other Ambulatory Visit: Payer: Self-pay | Admitting: *Deleted

## 2020-12-24 ENCOUNTER — Ambulatory Visit: Payer: Medicare Other | Admitting: Family Medicine

## 2020-12-24 ENCOUNTER — Telehealth: Payer: Self-pay | Admitting: Pharmacy Technician

## 2020-12-24 DIAGNOSIS — H35363 Drusen (degenerative) of macula, bilateral: Secondary | ICD-10-CM | POA: Diagnosis not present

## 2020-12-24 DIAGNOSIS — H35373 Puckering of macula, bilateral: Secondary | ICD-10-CM | POA: Diagnosis not present

## 2020-12-24 DIAGNOSIS — H04123 Dry eye syndrome of bilateral lacrimal glands: Secondary | ICD-10-CM | POA: Diagnosis not present

## 2020-12-24 DIAGNOSIS — Z596 Low income: Secondary | ICD-10-CM

## 2020-12-24 DIAGNOSIS — H02054 Trichiasis without entropian left upper eyelid: Secondary | ICD-10-CM | POA: Diagnosis not present

## 2020-12-24 NOTE — Patient Outreach (Deleted)
[facility] [place] [facility]) Care Management {RNCENTRALROLES:26559} Note   12/24/2020 Name:  Priscilla Cantrell MRN:  [id number] DOB:  04/23/1935  Summary: ***  Recommendations/Changes made from today's visit: ***   Subjective: Priscilla Cantrell is an 85 y.o. year old female who is a primary patient of McGowen, Adrian Blackwater, MD. The care management team was consulted for assistance with care management and/or care coordination needs.    {RNCENTRALROLES:26559} completed {RNCENTRALVISITTYPE:26560} today.   Objective:  Medications Reviewed Today     Reviewed by Verlin Grills, RN (Case Manager) on 12/21/20 at 1142  Med List Status: <None>   Medication Order Taking? Sig Documenting Provider Last Dose Status Informant  albuterol (PROAIR HFA) 108 (90 Base) MCG/ACT inhaler [id number] No Inhale 2 puffs into the lungs every 4 (four) hours as needed for wheezing or shortness of breath. Priscilla Sou, MD Taking Active   alendronate (FOSAMAX) 70 MG tablet [id number] No TAKE 1 TABLET(70 MG) BY MOUTH 1 TIME A WEEK WITH A FULL GLASS OF WATER AND ON AN EMPTY STOMACH McGowen, Adrian Blackwater, MD Taking Active   amLODipine (NORVASC) 5 MG tablet [id number] No TAKE 1 TABLET(5 MG) BY MOUTH DAILY McGowen, Adrian Blackwater, MD Taking Active   Artificial Tear Solution (SOOTHE XP) SOLN [id number] No Place 1 drop into both eyes 2 (two) times daily as needed (irritation). [provider] Taking Active Self  aspirin EC 81 MG tablet [id number] No Take 81 mg by mouth daily. [provider] Taking Active   azelastine (ASTELIN) 0.1 % nasal spray [id number] No Place 2 sprays into both nostrils 2 (two) times daily. Use in each nostril as directed  Patient not taking: No sig reported   McGowen, Adrian Blackwater, MD Not Taking Active   cetirizine (ZYRTEC) 10 MG tablet [id number] No Take 1 tablet (10 mg total) by mouth daily.  Patient not taking: No sig reported   McGowen, Adrian Blackwater, MD Not Taking Active   clobetasol (TEMOVATE)  0.05 % external solution [id number] No APPLY TO THE AFFECTED AREA EVERY DAY Sheffield, Priscilla Cantrell Taking Active   diazepam (VALIUM) 5 MG tablet [id number] No TAKE 1 TABLET BY MOUTH DAILY AS NEEDED FOR ANXIETY AND INSOMNIA McGowen, Adrian Blackwater, MD Taking Active   fluorometholone (FML) 0.1 % ophthalmic suspension [id number] No Place 1 drop into both eyes as directed. Tapering off [provider] Taking Active   fluticasone (FLONASE) 50 MCG/ACT nasal spray [id number] No Place 2 sprays into both nostrils daily.  Patient not taking: No sig reported   McGowen, Adrian Blackwater, MD Not Taking Active   Fluticasone-Umeclidin-Vilant (TRELEGY ELLIPTA) 100-62.5-25 MCG/INH AEPB [id number] No Inhale 1 puff into the lungs daily. Priscilla Mires, MD Taking Active   Fluticasone-Umeclidin-Vilant (TRELEGY ELLIPTA) 100-62.5-25 MCG/INH AEPB [id number] No Inhale 1 puff into the lungs daily. Priscilla Mires, MD Taking Active   furosemide (LASIX) 20 MG tablet [id number] No TAKE 1 TABLET BY MOUTH EVERY DAY AS NEEDED FOR INCREASED FLUID COLLECTION IN LEGS McGowen, Adrian Blackwater, MD Taking Active   nitrofurantoin (MACRODANTIN) 50 MG capsule [id number] No Take 1 capsule (50 mg total) by mouth at bedtime. McKenzie, [name], MD Taking Active   Probiotic Product (PROBIOTIC DAILY PO) [id number] No Take 1 capsule by mouth daily.  [provider] Taking Active Self  roflumilast (DALIRESP) 500 MCG TABS tablet [id number] No Take 1 tablet (500 mcg total) by mouth daily. Priscilla Cantrell Taking Active   SYNTHROID 88 MCG tablet [id number] No TAKE 1 TABLET(88 MCG)  BY MOUTH DAILY McGowen, Adrian Blackwater, MD Taking Active   triamcinolone cream (KENALOG) 0.1 % [id number] No Apply 1 application topically 2 (two) times daily.  Patient not taking: No sig reported   Kuneff, Priscilla Cantrell Not Taking Active   venlafaxine (EFFEXOR) 50 MG tablet [id number] No TAKE 1 TABLET BY MOUTH TWICE DAILY McGowen, Adrian Blackwater, MD Taking Active   Vibegron (GEMTESA) 75 MG TABS  [id number] No Take 1 capsule by mouth daily.  Patient not taking: No sig reported   McKenzie, [name], MD Not Taking Active   VISINE 0.05 % ophthalmic solution [id number] No 1 drop  as needed [provider] Taking Active   vitamin B-12 (CYANOCOBALAMIN) 100 MCG tablet [id number] No Take 100 mcg by mouth daily. [provider] Taking Active   VITAMIN D PO [id number] No Take by mouth. [provider] Taking Active              SDOH:  (Social Determinants of Health) assessments and interventions performed:  SDOH Interventions    Flowsheet Row Most Recent Value  SDOH Interventions   Food Insecurity Interventions Intervention Not Indicated  Housing Interventions Intervention Not Indicated  Intimate Partner Violence Interventions Intervention Not Indicated  Physical Activity Interventions Intervention Not Indicated  Social Connections Interventions Intervention Not Indicated  Transportation Interventions Intervention Not Indicated       Care Plan  Review of patient past medical history, allergies, medications, health status, including review of consultants reports, laboratory and other test data, was performed as part of comprehensive evaluation for care management services.   There are no care plans that you recently modified to display for this patient.    Plan: {CM FOLLOW UP PLAN:25073}  ***

## 2020-12-24 NOTE — Patient Outreach (Addendum)
[facility]) Care Management [place] Note   12/24/2020 Name:  Priscilla Cantrell MRN:  [id number] DOB:  02-02-1936  Summary: Per patient her breathing is fair. She does not go outside much. Patient is working part time at [employer]. Patient does not use her oxygen at work.   Patient uses oxygen at night. Patient is taking her medication as per ordered. Patient is receiving balance physical therapy twice a week. Patient is having some family dysfunction. RN discussed counseling if needed. Patient blood pressure is not controlled. RN discussed low sodium diet. RN discussed cooking with fresh vegetables and frozen foods.    Recommendations/Changes made from today's visit: RN provided education on Hypertension RN provided education on How to monitor blood pressure Patient will monitor blood pressure at home and document Patent will monitor for triggers of COPD exacerbation Patient will monitor sodium in diet  Subjective: Priscilla Cantrell is an 85 y.o. year old female who is a primary patient of McGowen, Adrian Blackwater, MD. The care management team was consulted for assistance with care management and/or care coordination needs.    RN Health Coach completed Telephone Visit today.   Objective:  Medications Reviewed Today     Reviewed by Verlin Grills, RN (Case Manager) on 12/21/20 at 1142  Med List Status: <None>   Medication Order Taking? Sig Documenting Provider Last Dose Status Informant  albuterol (PROAIR HFA) 108 (90 Base) MCG/ACT inhaler [id number] No Inhale 2 puffs into the lungs every 4 (four) hours as needed for wheezing or shortness of breath. [name], MD Taking Active   alendronate (FOSAMAX) 70 MG tablet [id number] No TAKE 1 TABLET(70 MG) BY MOUTH 1 TIME A WEEK WITH A FULL GLASS OF WATER AND ON AN EMPTY STOMACH McGowen, Adrian Blackwater, MD Taking Active   amLODipine (NORVASC) 5 MG tablet [id number] No TAKE 1 TABLET(5 MG) BY MOUTH DAILY McGowen, Adrian Blackwater, MD  Taking Active   Artificial Tear Solution (SOOTHE XP) SOLN [id number] No Place 1 drop into both eyes 2 (two) times daily as needed (irritation). [provider] Taking Active Self  aspirin EC 81 MG tablet [id number] No Take 81 mg by mouth daily. [provider] Taking Active   azelastine (ASTELIN) 0.1 % nasal spray [id number] No Place 2 sprays into both nostrils 2 (two) times daily. Use in each nostril as directed  Patient not taking: No sig reported   McGowen, Adrian Blackwater, MD Not Taking Active   cetirizine (ZYRTEC) 10 MG tablet [id number] No Take 1 tablet (10 mg total) by mouth daily.  Patient not taking: No sig reported   McGowen, Adrian Blackwater, MD Not Taking Active   clobetasol (TEMOVATE) 0.05 % external solution [id number] No APPLY TO THE AFFECTED AREA EVERY DAY Sheffield, Kelli R, PA-C Taking Active   diazepam (VALIUM) 5 MG tablet [id number] No TAKE 1 TABLET BY MOUTH DAILY AS NEEDED FOR ANXIETY AND INSOMNIA McGowen, Adrian Blackwater, MD Taking Active   fluorometholone (FML) 0.1 % ophthalmic suspension [id number] No Place 1 drop into both eyes as directed. Tapering off [provider] Taking Active   fluticasone (FLONASE) 50 MCG/ACT nasal spray [id number] No Place 2 sprays into both nostrils daily.  Patient not taking: No sig reported   McGowen, Adrian Blackwater, MD Not Taking Active   Fluticasone-Umeclidin-Vilant (TRELEGY ELLIPTA) 100-62.5-25 MCG/INH AEPB [id number] No Inhale 1 puff into the lungs daily. Chesley Mires, MD Taking Active   Fluticasone-Umeclidin-Vilant [facility] ELLIPTA) 100-62.5-25 MCG/INH AEPB [id number] No Inhale 1  puff into the lungs daily. Chesley Mires, MD Taking Active   furosemide (LASIX) 20 MG tablet [id number] No TAKE 1 TABLET BY MOUTH EVERY DAY AS NEEDED FOR INCREASED FLUID COLLECTION IN LEGS McGowen, Adrian Blackwater, MD Taking Active   nitrofurantoin (MACRODANTIN) 50 MG capsule [id number] No Take 1 capsule (50 mg total) by mouth at bedtime. McKenzie, [name], MD Taking Active    Probiotic Product (PROBIOTIC DAILY PO) [id number] No Take 1 capsule by mouth daily.  [provider] Taking Active Self  roflumilast (DALIRESP) 500 MCG TABS tablet [id number] No Take 1 tablet (500 mcg total) by mouth daily. Martyn Ehrich, NP Taking Active   SYNTHROID 88 MCG tablet [id number] No TAKE 1 TABLET(88 MCG) BY MOUTH DAILY McGowen, Adrian Blackwater, MD Taking Active   triamcinolone cream (KENALOG) 0.1 % [id number] No Apply 1 application topically 2 (two) times daily.  Patient not taking: No sig reported   Kuneff, Renee A, DO Not Taking Active   venlafaxine (EFFEXOR) 50 MG tablet [id number] No TAKE 1 TABLET BY MOUTH TWICE DAILY McGowen, Adrian Blackwater, MD Taking Active   Vibegron (GEMTESA) 75 MG TABS [id number] No Take 1 capsule by mouth daily.  Patient not taking: No sig reported   McKenzie, [name], MD Not Taking Active   VISINE 0.05 % ophthalmic solution [id number] No 1 drop  as needed [provider] Taking Active   vitamin B-12 (CYANOCOBALAMIN) 100 MCG tablet [id number] No Take 100 mcg by mouth daily. [provider] Taking Active   VITAMIN D PO [id number] No Take by mouth. [provider] Taking Active              SDOH:  (Social Determinants of Health) assessments and interventions performed:  SDOH Interventions    Flowsheet Row Most Recent Value  SDOH Interventions   Food Insecurity Interventions Intervention Not Indicated  Housing Interventions Intervention Not Indicated  Intimate Partner Violence Interventions Intervention Not Indicated  Physical Activity Interventions Intervention Not Indicated  Social Connections Interventions Intervention Not Indicated  Transportation Interventions Intervention Not Indicated       Care Plan  Review of patient past medical history, allergies, medications, health status, including review of consultants reports, laboratory and other test data, was performed as part of comprehensive evaluation for care  management services.   There are no care plans that you recently modified to display for this patient.    Plan: Telephone follow up appointment with care management team member scheduled for:  December The patient has been provided with contact information for the care management team and has been advised to call with any health related questions or concerns.  RN provided education on Hypertension RN sent educational material on How to monitor blood pressure Patient will monitor her blood pressure and document  [place] Management [phone]

## 2020-12-24 NOTE — Progress Notes (Signed)
[place] [facility])                                            [place] Team    12/24/2020  Priscilla Cantrell 1936-01-20 [id number]  Received provider portion(s) of patient assistance application(s) for [employer]. Faxed completed application and required documents into [employer].   Zein Helbing P. [name], [place]  [phone]

## 2020-12-24 NOTE — Patient Instructions (Signed)
Visit Information  Thank you for taking time to visit with me today. Please don't hesitate to contact me if I can be of assistance to you before our next scheduled telephone appointment.  Following are the goals we discussed today:  Current Barriers:  Knowledge Deficits related to plan of care for management of HTN and COPD   RNCM Clinical Goal(s):  Patient will verbalize understanding of plan for management of HTN and COPD as evidenced by continuation of monitoring blood pressure and triggers for COPD exacerbation through collaboration with RN Care manager, provider, and care team.   Interventions: Inter-disciplinary care team collaboration (see longitudinal plan of care) Evaluation of current treatment plan related to  self management and patient's adherence to plan as established by provider  Patient Goals/Self-Care Activities: Patient will self administer medications as prescribed as evidenced by self report/primary caregiver report  Patient will attend all scheduled provider appointments as evidenced by clinician review of documented attendance to scheduled appointments and patient/caregiver report Patient will call pharmacy for medication refills as evidenced by patient report and review of pharmacy fill history as appropriate Patient will attend church or other social activities as evidenced by patient report Patient will continue to perform ADL's independently as evidenced by patient/caregiver report Patient will continue to perform IADL's independently as evidenced by patient/caregiver report Patient will call provider office for new concerns or questions as evidenced by review of documented incoming telephone call notes and patient report - avoid second hand smoke - identify and avoid work-related triggers - identify and remove indoor air pollutants - limit outdoor activity during cold weather - listen for public air quality announcements every day - develop a rescue plan -  eliminate symptom triggers at home - follow rescue plan if symptoms flare-up - keep follow-up appointments: PCP and eye Dr - eat healthy/prescribed diet: Low sodium diet - check blood pressure weekly - choose a place to take my blood pressure (home, clinic or office, retail store) - write blood pressure results in a log or diary - learn about high blood pressure - take blood pressure log to all doctor appointments - call doctor for signs and symptoms of high blood pressure - keep all doctor appointments - report new symptoms to your doctor - limit salt intake to 2,300 mg/day    Our next appointment is by telephone on December 19   Please call the care guide team at [phone] if you need to cancel or reschedule your appointment.   Please call 911 if you are experiencing a Mental Health or [place] or need someone to talk to.  The patient verbalized understanding of instructions, educational materials, and care plan provided today and agreed to receive a mailed copy of patient instructions, educational materials, and care plan.   Telephone follow up appointment with care management team member scheduled for: The patient has been provided with contact information for the care management team and has been advised to call with any health related questions or concerns.   [facility] [phone]

## 2020-12-26 ENCOUNTER — Telehealth: Payer: Self-pay

## 2020-12-26 MED ORDER — AMLODIPINE BESYLATE 10 MG PO TABS: 10.0000 mg | ORAL_TABLET | Freq: Every day | ORAL | 1 refills | Status: DC

## 2020-12-26 NOTE — Telephone Encounter (Signed)
Bp's reviewed: avg 140/80 or so.  HR 60s avg.  Increase amlodipine to TWO 5mg  tabs daily. I'll send in rx for 10mg  tabs to start taking when finished with her 5 mg tabs.  Send in bps in 7-10d

## 2020-12-26 NOTE — Telephone Encounter (Signed)
Spoke with pt regarding recommendations,voiced understanding.

## 2020-12-26 NOTE — Telephone Encounter (Signed)
Pt brought in BP readings for review. Placed on PCP desk.  Please review and advise

## 2021-01-04 DIAGNOSIS — L821 Other seborrheic keratosis: Secondary | ICD-10-CM | POA: Diagnosis not present

## 2021-01-04 DIAGNOSIS — L218 Other seborrheic dermatitis: Secondary | ICD-10-CM | POA: Diagnosis not present

## 2021-01-04 DIAGNOSIS — D225 Melanocytic nevi of trunk: Secondary | ICD-10-CM | POA: Diagnosis not present

## 2021-01-08 NOTE — Telephone Encounter (Signed)
Pt brought more BP readings for review. Placed on provider desk for review

## 2021-01-09 NOTE — Telephone Encounter (Signed)
Overall I think her blood pressures are very good and I don't recommend any changes right now.

## 2021-01-09 NOTE — Telephone Encounter (Signed)
Pt advised of recommendations.  

## 2021-01-14 ENCOUNTER — Telehealth: Payer: Self-pay | Admitting: Family Medicine

## 2021-01-14 NOTE — Telephone Encounter (Signed)
Copied from [place] [phone]. Topic: Medicare AWV >> Jan 14, 2021 10:41 AM Harris-Coley, Hannah Beat wrote: Reason for CRM: Left message for patient to schedule Annual Wellness Visit.  Please schedule (telephone/video call) with Nurse Health Advisor Charlott Rakes, RN at [facility]. Please call [phone] ask for [facility]

## 2021-01-16 ENCOUNTER — Telehealth: Payer: Self-pay

## 2021-01-16 NOTE — Telephone Encounter (Signed)
Patient called about her legs swelling and blood pressure readings slow the last couple of days.  Patient not sure if its medication reaction to her bp meds or not. amLODipine (NORVASC) 10 MG tablet [[id number]]   I scheduled patient appt with Dr. Anitra Lauth for Friday 12/16.  Please advise. Patient can be reached at [phone]

## 2021-01-16 NOTE — Telephone Encounter (Signed)
Spoke with pt and states she has been taking Lasix prn as directed for leg swelling. Her BP readings; 12/12 126/63 HR 66, 12/13 94/58 HR 79 but has not taken it today. Unsure if med dose of amlodipine 10mg  is currently too much. Pt has f/u appt scheduled for 12/16 and advised to keep for evaluation.  Please review and advise

## 2021-01-16 NOTE — Telephone Encounter (Signed)
As long as patient does not feel dizziness or acute fatigue then okay to just continue to monitor blood pressure and not change amlodipine dosing.  If feeling lightheaded or acute fatigue then decrease amlodipine to 5 mg a day.  The lower extremity swelling can be a side effect of amlodipine.  Reassure her please

## 2021-01-16 NOTE — Telephone Encounter (Signed)
LM for pt regarding med concern

## 2021-01-16 NOTE — Telephone Encounter (Signed)
Pt returned call. Please call back.

## 2021-01-16 NOTE — Telephone Encounter (Signed)
Pt advised of recommendations and will be keeping appt for 12/16

## 2021-01-18 ENCOUNTER — Ambulatory Visit (INDEPENDENT_AMBULATORY_CARE_PROVIDER_SITE_OTHER): Payer: Medicare Other | Admitting: Family Medicine

## 2021-01-18 ENCOUNTER — Encounter: Payer: Self-pay | Admitting: Family Medicine

## 2021-01-18 ENCOUNTER — Other Ambulatory Visit: Payer: Self-pay

## 2021-01-18 VITALS — BP 140/80 | HR 56 | Temp 97.3°F | Ht 60.0 in | Wt 126.6 lb

## 2021-01-18 DIAGNOSIS — R600 Localized edema: Secondary | ICD-10-CM

## 2021-01-18 DIAGNOSIS — I10 Essential (primary) hypertension: Secondary | ICD-10-CM

## 2021-01-18 DIAGNOSIS — T50905A Adverse effect of unspecified drugs, medicaments and biological substances, initial encounter: Secondary | ICD-10-CM | POA: Diagnosis not present

## 2021-01-18 NOTE — Progress Notes (Signed)
OFFICE VISIT  01/18/2021  CC:  Chief Complaint  Patient presents with   Leg swelling    Tingling sensation in both legs for 3 days; unsure if side effect to increase of Amlodipine. C/o shortness of breath for 3 days but exerting a lot of energy at work.    HPI:    Patient is a 85 y.o. female who presents for lower extremity edema, HTN/bp meds, dry mouth. I last saw her 12/18/20 and HTN well controlled at that time on amlodipine 7.5mg  qd. Pt report of home bp's after that were slightly elevated so I increased amlod to 10 qd.  INTERIM HX: Jocelyn Lamer feels well.  She did note some increase in her lower extremity swelling after we went up on the amlodipine.  Home blood pressures have been consistently 120s over 60s to 70s lately.  Her legs do not hurt or itch.   Past Medical History:  Diagnosis Date   Allergic rhinitis    with upper airway cough: as of 07/2017 pulm f/u she was instructed to use astelin, flonase, and saline more consistently   BPPV (benign paroxysmal positional vertigo) 03/2018   Chest pain, non-cardiac    Cardiac CT showed no signif obstructive dz, mildly elevated calcium level (Dr. [name]).   Chronic cystitis with hematuria    [name]   Chronic hypoxemic respiratory failure ([place]) 02/02/2015   2L oxygen 24/7   Chronic renal insufficiency, stage 2 (mild)    GFR approx 60   Cold hands    NCS/EMGs normal.  Hand dysesthesias per neuro--reassured   COPD (chronic obstructive pulmonary disease) ([place])    GOLD 4.  spirometry 01/10/09 FEV 0.97(52%), FEV1% 47.  With chronic bronchitis as of 07/2017.   DDD (degenerative disc disease), cervical    1998 MRI C5-6 impingemt (left).  MRI 2021 (neurol) 1 level canal stenosis, 1 level foraminal stenosis--->PT   DDD (degenerative disc disease), lumbar    Depression with anxiety 04/15/2011   Diarrheal disease Summer 2017   ? refractor C diff vs post infectious diarrhea predominant syndrome--GI told her to avoid lactose,  sorbitol, and caffeine (08/30/15--[name]).  As of 10/05/15 pt reports GI dx'd her with C diff and rx'd more flagyl and she is also on cholestyramine.  As of 02/2016, pt's sx's resolved completely.   Diverticulosis of colon    Procto '97 and colonoscopy 2002   Dysplastic nevus of upper extremity 04/2014   R tricep ([name])   Edema of both lower extremities due to peripheral venous insufficiency    Varicosities bilat, swelling L>R.  R baker's cyst.  Got vein clinic eval 02/2018->sclerotherapy recommended but since no hemorrhage or ulceration, insurance will not cover the procedure.   Family history of adverse reaction to anesthesia    mother died when patient age 58 receiving anesthesia for thyroid surgery   Fibrocystic breast disease    w/fibroadenoma.  Bx's showed NO atypia (Dr. Margot Chimes).  Mammo neg 2008.   GERD (gastroesophageal reflux disease)    History of double vision    Ophthalmologist, Dr. Herbert Deaner, is further evaluating this with MRI  orbits and limited brain (myesthenia gravis testing neg 07/2010)   History of home oxygen therapy    uses 2 liters at hs   Hypertension    EKG 03/2010 normal   Hypothyroidism    Hashimoto's, dx'd 1989   Idiopathic peripheral neuropathy 04/2018   Sensory (numb feet) S1 distribution bilat, c/w spinal stenosis sensory neuropathy (no pain). 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  Labs [place], MRI C spine->some spondylosis/stenosis.  UE NCS/EMS: normal 04/2019.   Lesion of right native kidney 2017   found on CT 02/2015.  F/u MRI 03/31/15 showed it to be smaller and less likely of any significance but repeat CT renal protocol in 20mo was recommended by radiology.  This was done 10/26/15 and showed no interval change in the lesion, but b/c the lesion enhances with contrast, radiol rec'd urol consult (b/c pap renal RCC could not be excluded).  Saw urol 03/06/16--stable on u/s 09/2016 and 09/2017.   OAB  (overactive bladder)    Osteoarthritis, hip, bilateral    she is s/p bilat THA as of 02/2018.   Osteoporosis    radius, T score -2.5->rec'd alendronate 08/2019->plan rpt DEXA 08/2021.   Pneumonia    Postmenopausal atrophic vaginitis    improved with estrace 2 X/week.   Prolapse of vaginal cuff after hysterectomy    10/2018: Dr. Wayne Both. Mild colpocele 04/2019 per GYN (no surgery)   Pulmonary nodule    CT chest 12/10, June 2011.  No nodule on CT 06/2010.   Recurrent Clostridium difficile diarrhea    Episode 09/19/2016: resolved with prolonged course of flagyl.  11/2016 recurrence treated with 10d of Dificid (Fidaxomicin) by GI (Dr. Earlean Shawl).   Recurrent UTI    likely related to pt's atrophic vaginitis.  Urol started vaginal estrace 03/2016.   Rib fracture 09/2018   R 7th, laterally-->sustained when her dog pulled her over and she fell.   RLS (restless legs syndrome)    neuro->gabapentin trial 2020/21   Shingles 02/2014   R abd/flank   Spinal stenosis of lumbar region with neurogenic claudication    2008 MRI (L3-4, L4-5, L5-S1).  +S1 distribution sensory loss bilat    Past Surgical History:  Procedure Laterality Date   ABDOMINAL HYSTERECTOMY  1978   no BSO per pt--nonmalignant reasons   APPENDECTOMY     BREAST CYST EXCISION     Last screening mammogram 10/2010 was normal.   CHOLECYSTECTOMY  2001   COLONOSCOPY  04/2005; 12/06/2015   2007 NORMAL.  2017 adenomatous polyp x 1.  Mod sigmoid diverticulosis (Dr. Earlean Shawl).     DEXA  05/18/2017; 08/7019   2019 NORMAL (T-score 0.1).  08/2019->T score -2.5->alendronate started->plan Rpt 08/2021.   HIP CLOSED REDUCTION Right 12/25/2015   Procedure: CLOSED REDUCTION HIP;  Surgeon: Nicholes Stairs, MD;  Location: [facility];  Service: Orthopedics;  Laterality: Right;   NCV/EMS  10/2018   chronic and symmetric sensorimotor axonal polyneuropathy affecting the lower extremities   TONSILLECTOMY     TOTAL HIP ARTHROPLASTY Right 11/01/2014   Procedure: RIGHT  TOTAL HIP ARTHROPLASTY;  Surgeon: Latanya Maudlin, MD;  Location: [facility];  Service: Orthopedics;  Laterality: Right;   TOTAL HIP ARTHROPLASTY Left 12/08/2017   Procedure: LEFT TOTAL HIP ARTHROPLASTY ANTERIOR APPROACH;  Surgeon: Paralee Cancel, MD;  Location: [facility];  Service: Orthopedics;  Laterality: Left;  70 mins   TRANSTHORACIC ECHOCARDIOGRAM  08/2014   EF 60-65%, grade I diast dysfxn   TUBAL LIGATION  1974    Outpatient Medications Prior to Visit  Medication Sig Dispense Refill   albuterol (PROAIR HFA) 108 (90 Base) MCG/ACT inhaler Inhale 2 puffs into the lungs every 4 (four) hours as needed for wheezing or shortness of breath. 8.5 g 1   alendronate (FOSAMAX) 70 MG tablet TAKE 1 TABLET(70 MG) BY MOUTH 1 TIME A WEEK WITH A FULL GLASS OF WATER  AND ON AN EMPTY STOMACH 12 tablet 3   amLODipine (NORVASC) 10 MG tablet Take 1 tablet (10 mg total) by mouth daily. 30 tablet 1   Artificial Tear Solution (SOOTHE XP) SOLN Place 1 drop into both eyes 2 (two) times daily as needed (irritation).     aspirin EC 81 MG tablet Take 81 mg by mouth daily.     clobetasol (TEMOVATE) 0.05 % external solution APPLY TO THE AFFECTED AREA EVERY DAY 50 mL 4   diazepam (VALIUM) 5 MG tablet TAKE 1 TABLET BY MOUTH DAILY AS NEEDED FOR ANXIETY AND INSOMNIA 30 tablet 5   fluorometholone (FML) 0.1 % ophthalmic suspension Place 1 drop into both eyes as directed. Tapering off     Fluticasone-Umeclidin-Vilant (TRELEGY ELLIPTA) 100-62.5-25 MCG/INH AEPB Inhale 1 puff into the lungs daily. 60 each 6   Fluticasone-Umeclidin-Vilant (TRELEGY ELLIPTA) 100-62.5-25 MCG/INH AEPB Inhale 1 puff into the lungs daily. 14 each 0   furosemide (LASIX) 20 MG tablet TAKE 1 TABLET BY MOUTH EVERY DAY AS NEEDED FOR INCREASED FLUID COLLECTION IN LEGS 30 tablet 3   nitrofurantoin (MACRODANTIN) 50 MG capsule Take 1 capsule (50 mg total) by mouth at bedtime. 90 capsule 3   Probiotic Product (PROBIOTIC DAILY PO) Take 1 capsule by mouth daily.       roflumilast (DALIRESP) 500 MCG TABS tablet Take 1 tablet (500 mcg total) by mouth daily. 90 tablet 3   SYNTHROID 88 MCG tablet TAKE 1 TABLET(88 MCG) BY MOUTH DAILY 30 tablet 1   triamcinolone cream (KENALOG) 0.1 % Apply 1 application topically 2 (two) times daily. 45 g 2   venlafaxine (EFFEXOR) 50 MG tablet TAKE 1 TABLET BY MOUTH TWICE DAILY 180 tablet 1   VISINE 0.05 % ophthalmic solution 1 drop  as needed     vitamin B-12 (CYANOCOBALAMIN) 100 MCG tablet Take 100 mcg by mouth daily.     VITAMIN D PO Take by mouth.     azelastine (ASTELIN) 0.1 % nasal spray Place 2 sprays into both nostrils 2 (two) times daily. Use in each nostril as directed (Patient not taking: Reported on 12/07/2020) 30 mL 3   cetirizine (ZYRTEC) 10 MG tablet Take 1 tablet (10 mg total) by mouth daily. (Patient not taking: Reported on 12/07/2020) 30 tablet 6   fluticasone (FLONASE) 50 MCG/ACT nasal spray Place 2 sprays into both nostrils daily. (Patient not taking: Reported on 12/07/2020) 16 g 6   Vibegron (GEMTESA) 75 MG TABS Take 1 capsule by mouth daily. (Patient not taking: Reported on 12/07/2020) 30 tablet 11   No facility-administered medications prior to visit.    Allergies  Allergen Reactions   Losartan Other (See Comments)    hyperkalemia   Penicillins Hives, Swelling and Rash    Has patient had a PCN reaction causing immediate rash, facial/tongue/throat swelling, SOB or lightheadedness with hypotension: YES Has patient had a PCN reaction causing severe rash involving mucus membranes or skin necrosis: NO Has patient had a PCN reaction that required hospitalization NO Has patient had a PCN reaction occurring within the last 10 years: NO If all of the above answers are "NO", then may proceed with Cephalosporin use.    Cefixime [Kdc:Cefixime] Other (See Comments)    unspecified   Gabapentin Other (See Comments)    Feels woozy   Indocin [Indomethacin] Other (See Comments)    Painful tongue and throat   Singulair  [Montelukast Sodium]     Chest tightness, decreased mental clarity   Sulfa Antibiotics Other (  See Comments)    unspecified   Ace Inhibitors Other (See Comments)    cough   Statins Other (See Comments)    Myalgias     ROS As per HPI  PE: Vitals with BMI 01/18/2021 12/18/2020 12/13/2020  Height 5\' 0"  5\' 0"  5\' 0"   Weight 126 lbs 10 oz 129 lbs 3 oz 128 lbs 3 oz  BMI 24.72 62.70 35.00  Systolic 938 182 993  Diastolic 72 69 75  Pulse 56 73 58  Some encounter information is confidential and restricted. Go to Review Flowsheets activity to see all data.   02 sat 95% on RA today  Physical Exam  Gen: Alert, well appearing.  Patient is oriented to person, place, time, and situation. AFFECT: pleasant, lucid thought and speech. CV: RRR, no m/r/g.   LUNGS: CTA bilat, nonlabored resps, good aeration in all lung fields. EXT: no clubbing or cyanosis.  2-3 + pitting edema in both ankles.  Not tense. No erythema or rash.  Scattered non-inflamed varicosities and spider veins present.   LABS:  Last metabolic panel Lab Results  Component Value Date   GLUCOSE 74 12/07/2020   NA 140 12/07/2020   K 4.0 12/07/2020   CL 105 12/07/2020   CO2 22 12/07/2020   BUN 18 12/07/2020   CREATININE 0.83 12/07/2020   GFRNONAA >60 12/10/2017   CALCIUM 9.4 12/07/2020   PHOS 3.8 01/31/2013   PROT 6.3 12/07/2020   ALBUMIN 4.5 05/09/2020   BILITOT 0.5 12/07/2020   ALKPHOS 105 05/09/2020   AST 14 12/07/2020   ALT 10 12/07/2020   ANIONGAP 6 12/10/2017   Last thyroid functions Lab Results  Component Value Date   TSH 2.57 12/07/2020   T3TOTAL 97.2 07/11/2010   T4TOTAL 12.4 07/11/2010   IMPRESSION AND PLAN:  #1 hypertension, recent poor control but now in good range on amlodipine 10 mg daily. She does have some side effect of lower extremity swelling with the increase in dose of this medicine.  This is superimposed on her chronic mild lower extremity edema due to venous insufficiency.  Reassurance  given.  She is willing to tolerate the swelling and will continue best efforts at avoiding high sodium foods and elevating legs periodically.  She also has compression stockings to wear as needed. Signs/symptoms to call or return for were reviewed and pt expressed understanding.  Continue amlodipine 10 mg a day.  Continue home blood pressure monitoring. No labs needed today.  An After Visit Summary was printed and given to the patient.  FOLLOW UP: Return for Keep appointment 03-11-21.  Signed:  Crissie Sickles, MD           01/18/2021

## 2021-01-21 ENCOUNTER — Other Ambulatory Visit: Payer: Self-pay | Admitting: *Deleted

## 2021-01-21 NOTE — Patient Outreach (Signed)
[place] [facility]) Care Management  01/21/2021  YUI MULVANEY 1935-07-05 [id number]   RN Health Coach attempted follow up outreach call to patient.  Patient was unavailable. HIPPA compliance voicemail message left with return callback number.  Plan: RN will call patient again within 30 days.  [place] Care Management [phone]

## 2021-01-23 ENCOUNTER — Ambulatory Visit: Payer: Medicare Other | Admitting: Dermatology

## 2021-02-07 ENCOUNTER — Telehealth: Payer: Self-pay | Admitting: Pharmacy Technician

## 2021-02-07 DIAGNOSIS — Z596 Low income: Secondary | ICD-10-CM

## 2021-02-07 NOTE — Progress Notes (Signed)
[place] [facility])                                            [facility] Team    02/07/2021  Priscilla Cantrell [birth year] [id number]  Care coordination call placed to [employer] in regard to [place] application for [id number] re enrollment.  Spoke to [employer] who informs patient is APPROVED 02/03/21-02/02/22. She informs refills will be processed and mailed out automatically o patient's home based on last fill date in 2022. She informs for this patient an order has gone to processing today.  Jenson Beedle P. Coy [place], [place]  [phone]

## 2021-02-13 ENCOUNTER — Other Ambulatory Visit: Payer: Self-pay | Admitting: *Deleted

## 2021-02-13 NOTE — Patient Outreach (Signed)
[name] [facility]) Care Management  02/13/2021  Priscilla Cantrell 11-10-1935 [id number]   RN Health Coach attempted follow up outreach call to patient.  Patient was unavailable. HIPPA compliance voicemail message left with return callback number.  Plan: RN will call patient again within 30 days.  [place] Care Management [phone]

## 2021-02-20 ENCOUNTER — Other Ambulatory Visit: Payer: Self-pay

## 2021-02-20 ENCOUNTER — Encounter: Payer: Self-pay | Admitting: Family Medicine

## 2021-02-20 ENCOUNTER — Ambulatory Visit (INDEPENDENT_AMBULATORY_CARE_PROVIDER_SITE_OTHER): Payer: Medicare Other | Admitting: Family Medicine

## 2021-02-20 VITALS — BP 98/64 | HR 64 | Temp 97.6°F | Ht 60.0 in | Wt 126.8 lb

## 2021-02-20 DIAGNOSIS — R197 Diarrhea, unspecified: Secondary | ICD-10-CM | POA: Diagnosis not present

## 2021-02-20 DIAGNOSIS — K625 Hemorrhage of anus and rectum: Secondary | ICD-10-CM

## 2021-02-20 DIAGNOSIS — R600 Localized edema: Secondary | ICD-10-CM

## 2021-02-20 NOTE — Progress Notes (Signed)
OFFICE VISIT  02/22/2021  CC:  Chief Complaint  Patient presents with   Rectal Bleeding    X3 days but noticed on Tuesday. Has been having diarrhea every morning    HPI:    Patient is a 86 y.o. female who presents for "boil/cyst/bleeding on bottom".  HPI: Onset 4 days ago of noting some small amount of blood in the perineal pads she uses.  Not much in the way of preceding/associated pain in the GU area though. She is having 1 slightly loose dark brown BM after her breakfast lately but denies any black tarry stool and does not see any bright red blood in stool.  Notes some swelling of both lower legs the last 3 to 4 days as well. Describes her diet lately and it is higher in sodium than usual.  She started Lasix 3 to 4 days ago and has not noticed much improvement. Describes good fluid intake.  She is up on her feet all day working at [employer]. Has compression stockings but has not worn them.  Is not elevating her legs.  ROS as above, plus--> no fevers, no CP, no SOB, no wheezing, no cough, no dizziness, no HAs, no rashes, no melena/hematochezia.  No polyuria or polydipsia.  No myalgias or arthralgias.  No focal weakness, paresthesias, or tremors.  No acute vision or hearing abnormalities.  No dysuria or unusual/new urinary urgency or frequency.  No n/v/d or abd pain.  No palpitations.     Past Medical History:  Diagnosis Date   Allergic rhinitis    with upper airway cough: as of 07/2017 pulm f/u she was instructed to use astelin, flonase, and saline more consistently   BPPV (benign paroxysmal positional vertigo) 03/2018   Chest pain, non-cardiac    Cardiac CT showed no signif obstructive dz, mildly elevated calcium level (Dr. [name]).   Chronic cystitis with hematuria    [name]   Chronic hypoxemic respiratory failure ([place]) 02/02/2015   2L oxygen 24/7   Chronic renal insufficiency, stage 2 (mild)    GFR approx 60   Cold hands    NCS/EMGs normal.  Hand  dysesthesias per neuro--reassured   COPD (chronic obstructive pulmonary disease) ([place])    GOLD 4.  spirometry 01/10/09 FEV 0.97(52%), FEV1% 47.  With chronic bronchitis as of 07/2017.   DDD (degenerative disc disease), cervical    1998 MRI C5-6 impingemt (left).  MRI 2021 (neurol) 1 level canal stenosis, 1 level foraminal stenosis--->PT   DDD (degenerative disc disease), lumbar    Depression with anxiety 04/15/2011   Diarrheal disease Summer 2017   ? 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).  As of 10/05/15 pt reports GI dx'd her with C diff and rx'd more flagyl and she is also on cholestyramine.  As of 02/2016, pt's sx's resolved completely.   Diverticulosis of colon    Procto '97 and colonoscopy 2002   Dysplastic nevus of upper extremity 04/2014   R tricep ([name])   Edema of both lower extremities due to peripheral venous insufficiency    Varicosities bilat, swelling L>R.  R baker's cyst.  Got vein clinic eval 02/2018->sclerotherapy recommended but since no hemorrhage or ulceration, insurance will not cover the procedure.   Family history of adverse reaction to anesthesia    mother died when patient age 47 receiving anesthesia for thyroid surgery   Fibrocystic breast disease    w/fibroadenoma.  Bx's  showed NO atypia (Dr. Margot Chimes).  Mammo neg 2008.   GERD (gastroesophageal reflux disease)    History of double vision    Ophthalmologist, Dr. Herbert Deaner, is further evaluating this with MRI  orbits and limited brain (myesthenia gravis testing neg 07/2010)   History of home oxygen therapy    uses 2 liters at hs   Hypertension    EKG 03/2010 normal   Hypothyroidism    Hashimoto's, dx'd 1989   Idiopathic peripheral neuropathy 04/2018   Sensory (numb feet) S1 distribution bilat, c/w spinal stenosis sensory neuropathy (no pain). 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  Labs [place], MRI C spine->some spondylosis/stenosis.  UE NCS/EMS: normal 04/2019.   Lesion of right native kidney 2017   found on CT 02/2015.  F/u MRI 03/31/15 showed it to be smaller and less likely of any significance but repeat CT renal protocol in 70mo was recommended by radiology.  This was done 10/26/15 and showed no interval change in the lesion, but b/c the lesion enhances with contrast, radiol rec'd urol consult (b/c pap renal RCC could not be excluded).  Saw urol 03/06/16--stable on u/s 09/2016 and 09/2017.   OAB (overactive bladder)    Osteoarthritis, hip, bilateral    she is s/p bilat THA as of 02/2018.   Osteoporosis    radius, T score -2.5->rec'd alendronate 08/2019->plan rpt DEXA 08/2021.   Pneumonia    Postmenopausal atrophic vaginitis    improved with estrace 2 X/week.   Prolapse of vaginal cuff after hysterectomy    10/2018: Dr. Wayne Both. Mild colpocele 04/2019 per GYN (no surgery)   Pulmonary nodule    CT chest 12/10, June 2011.  No nodule on CT 06/2010.   Recurrent Clostridium difficile diarrhea    Episode 09/19/2016: resolved with prolonged course of flagyl.  11/2016 recurrence treated with 10d of Dificid (Fidaxomicin) by GI (Dr. Earlean Shawl).   Recurrent UTI    likely related to pt's atrophic vaginitis.  Urol started vaginal estrace 03/2016.   Rib fracture 09/2018   R 7th, laterally-->sustained when her dog pulled her over and she fell.   RLS (restless legs syndrome)    neuro->gabapentin trial 2020/21   Shingles 02/2014   R abd/flank   Spinal stenosis of lumbar region with neurogenic claudication    2008 MRI (L3-4, L4-5, L5-S1).  +S1 distribution sensory loss bilat    Past Surgical History:  Procedure Laterality Date   ABDOMINAL HYSTERECTOMY  1978   no BSO per pt--nonmalignant reasons   APPENDECTOMY     BREAST CYST EXCISION     Last screening mammogram 10/2010 was normal.   CHOLECYSTECTOMY  2001   COLONOSCOPY  04/2005; 12/06/2015   2007 NORMAL.   2017 adenomatous polyp x 1.  Mod sigmoid diverticulosis (Dr. Earlean Shawl).     DEXA  05/18/2017; 08/7019   2019 NORMAL (T-score 0.1).  08/2019->T score -2.5->alendronate started->plan Rpt 08/2021.   HIP CLOSED REDUCTION Right 12/25/2015   Procedure: CLOSED REDUCTION HIP;  Surgeon: Nicholes Stairs, MD;  Location: [facility];  Service: Orthopedics;  Laterality: Right;   NCV/EMS  10/2018   chronic and symmetric sensorimotor axonal polyneuropathy affecting the lower extremities   TONSILLECTOMY     TOTAL HIP ARTHROPLASTY Right 11/01/2014   Procedure: RIGHT TOTAL HIP ARTHROPLASTY;  Surgeon: Latanya Maudlin, MD;  Location: [facility];  Service: Orthopedics;  Laterality: Right;   TOTAL HIP ARTHROPLASTY Left 12/08/2017   Procedure: LEFT TOTAL HIP ARTHROPLASTY ANTERIOR APPROACH;  Surgeon: Paralee Cancel,  MD;  Location: [facility];  Service: Orthopedics;  Laterality: Left;  70 mins   TRANSTHORACIC ECHOCARDIOGRAM  08/2014   EF 60-65%, grade I diast dysfxn   TUBAL LIGATION  1974    Outpatient Medications Prior to Visit  Medication Sig Dispense Refill   albuterol (PROAIR HFA) 108 (90 Base) MCG/ACT inhaler Inhale 2 puffs into the lungs every 4 (four) hours as needed for wheezing or shortness of breath. 8.5 g 1   alendronate (FOSAMAX) 70 MG tablet TAKE 1 TABLET(70 MG) BY MOUTH 1 TIME A WEEK WITH A FULL GLASS OF WATER AND ON AN EMPTY STOMACH 12 tablet 3   amLODipine (NORVASC) 10 MG tablet Take 1 tablet (10 mg total) by mouth daily. 30 tablet 1   Artificial Tear Solution (SOOTHE XP) SOLN Place 1 drop into both eyes 2 (two) times daily as needed (irritation).     aspirin EC 81 MG tablet Take 81 mg by mouth daily.     clobetasol (TEMOVATE) 0.05 % external solution APPLY TO THE AFFECTED AREA EVERY DAY 50 mL 4   diazepam (VALIUM) 5 MG tablet TAKE 1 TABLET BY MOUTH DAILY AS NEEDED FOR ANXIETY AND INSOMNIA 30 tablet 5   fluorometholone (FML) 0.1 % ophthalmic suspension Place 1 drop into both eyes as directed. Tapering off      Fluticasone-Umeclidin-Vilant (TRELEGY ELLIPTA) 100-62.5-25 MCG/INH AEPB Inhale 1 puff into the lungs daily. 60 each 6   Fluticasone-Umeclidin-Vilant (TRELEGY ELLIPTA) 100-62.5-25 MCG/INH AEPB Inhale 1 puff into the lungs daily. 14 each 0   furosemide (LASIX) 20 MG tablet TAKE 1 TABLET BY MOUTH EVERY DAY AS NEEDED FOR INCREASED FLUID COLLECTION IN LEGS 30 tablet 3   nitrofurantoin (MACRODANTIN) 50 MG capsule Take 1 capsule (50 mg total) by mouth at bedtime. 90 capsule 3   Probiotic Product (PROBIOTIC DAILY PO) Take 1 capsule by mouth daily.      roflumilast (DALIRESP) 500 MCG TABS tablet Take 1 tablet (500 mcg total) by mouth daily. 90 tablet 3   SYNTHROID 88 MCG tablet TAKE 1 TABLET(88 MCG) BY MOUTH DAILY 30 tablet 1   triamcinolone cream (KENALOG) 0.1 % Apply 1 application topically 2 (two) times daily. 45 g 2   venlafaxine (EFFEXOR) 50 MG tablet TAKE 1 TABLET BY MOUTH TWICE DAILY 180 tablet 1   VISINE 0.05 % ophthalmic solution 1 drop  as needed     vitamin B-12 (CYANOCOBALAMIN) 100 MCG tablet Take 100 mcg by mouth daily.     VITAMIN D PO Take by mouth.     azelastine (ASTELIN) 0.1 % nasal spray Place 2 sprays into both nostrils 2 (two) times daily. Use in each nostril as directed (Patient not taking: Reported on 12/07/2020) 30 mL 3   cetirizine (ZYRTEC) 10 MG tablet Take 1 tablet (10 mg total) by mouth daily. (Patient not taking: Reported on 12/07/2020) 30 tablet 6   fluticasone (FLONASE) 50 MCG/ACT nasal spray Place 2 sprays into both nostrils daily. (Patient not taking: Reported on 12/07/2020) 16 g 6   Vibegron (GEMTESA) 75 MG TABS Take 1 capsule by mouth daily. (Patient not taking: Reported on 12/07/2020) 30 tablet 11   No facility-administered medications prior to visit.    Allergies  Allergen Reactions   Losartan Other (See Comments)    hyperkalemia   Penicillins Hives, Swelling and Rash    Has patient had a PCN reaction causing immediate rash, facial/tongue/throat swelling, SOB or  lightheadedness with hypotension: YES Has patient had a PCN reaction causing severe  rash involving mucus membranes or skin necrosis: NO Has patient had a PCN reaction that required hospitalization NO Has patient had a PCN reaction occurring within the last 10 years: NO If all of the above answers are "NO", then may proceed with Cephalosporin use.    Cefixime [Kdc:Cefixime] Other (See Comments)    unspecified   Gabapentin Other (See Comments)    Feels woozy   Indocin [Indomethacin] Other (See Comments)    Painful tongue and throat   Singulair [Montelukast Sodium]     Chest tightness, decreased mental clarity   Sulfa Antibiotics Other (See Comments)    unspecified   Ace Inhibitors Other (See Comments)    cough   Statins Other (See Comments)    Myalgias    ROS As per HPI  PE: Vitals with BMI 02/20/2021 01/18/2021 01/18/2021  Height 5\' 0"  - 5\' 0"   Weight 126 lbs 13 oz - 126 lbs 10 oz  BMI 76.73 - 41.93  Systolic 98 790 240  Diastolic 64 80 72  Pulse 64 - 56  Some encounter information is confidential and restricted. Go to Review Flowsheets activity to see all data.  02 sat today on RA tested by me-->94%.  Physical Exam Exam chaperoned by Deveron Furlong, CMA.  Gen: Alert, well appearing.  Patient is oriented to person, place, time, and situation. AFFECT: pleasant, lucid thought and speech. CV: RRR, no m/r/g.   LUNGS: CTA bilat, nonlabored resps, good aeration in all lung fields. EXT: no clubbing or cyanosis.  2+ bilat LL pitting edema from mid tibia level down to ankles. No erythema. GU: no rash, no labial lesion or tenderness. Vaginal prolapse noted, no signif irritated/abraded tissue noted. Urethra does not appear irritated Mild fecal soiling--light brown. No anal tenderness or hemorrhoid or fissure.  Mild dec anal/rectal tone.  LABS:  Last CBC Lab Results  Component Value Date   WBC 8.0 02/20/2021   HGB 13.8 02/20/2021   HCT 41.9 02/20/2021   MCV 89.4  02/20/2021   MCH 28.4 12/10/2017   RDW 14.3 02/20/2021   PLT 268.0 97/35/3299   Last metabolic panel Lab Results  Component Value Date   GLUCOSE 80 02/20/2021   NA 139 02/20/2021   K 3.8 02/20/2021   CL 104 02/20/2021   CO2 25 02/20/2021   BUN 25 (H) 02/20/2021   CREATININE 1.16 02/20/2021   GFRNONAA >60 12/10/2017   CALCIUM 9.5 02/20/2021   PHOS 3.8 01/31/2013   PROT 6.3 12/07/2020   ALBUMIN 4.5 05/09/2020   BILITOT 0.5 12/07/2020   ALKPHOS 105 05/09/2020   AST 14 12/07/2020   ALT 10 12/07/2020   ANIONGAP 6 12/10/2017   IMPRESSION AND PLAN:  #1 GU versus rectal bleeding.  A little more problem with fecal soiling lately than her usual. Exam unremarkable today. Checking CBC today.  Check C. difficile PCR.  #2 bilateral lower extremity edema--she has some chronic venous insufficiency and this has worsened lately most likely due to increased sodium intake, +up on feet all day regularly. She has Lasix to use as needed and she has taken this the last 3 days.  Encouraged her to wear her compression stockings. Electrolytes and creatinine checked today.  An After Visit Summary was printed and given to the patient.  FOLLOW UP: Return if symptoms worsen or fail to improve.  Signed:  Crissie Sickles, MD           02/22/2021

## 2021-02-20 NOTE — Patient Instructions (Signed)
Buy over the counter desitin ointment or Boudreux's buttpaste and apply to anal region 2-3 times a day for skin irritation

## 2021-02-21 ENCOUNTER — Ambulatory Visit: Payer: Medicare Other

## 2021-02-21 LAB — BASIC METABOLIC PANEL
BUN: 25 mg/dL — ABNORMAL HIGH (ref 6–23)
CO2: 25 mEq/L (ref 19–32)
Calcium: 9.5 mg/dL (ref 8.4–10.5)
Chloride: 104 mEq/L (ref 96–112)
Creatinine, Ser: 1.16 mg/dL (ref 0.40–1.20)
GFR: 43.51 mL/min — ABNORMAL LOW (ref >60.00)
Glucose, Bld: 80 mg/dL (ref 70–99)
Potassium: 3.8 mEq/L (ref 3.5–5.1)
Sodium: 139 mEq/L (ref 135–145)

## 2021-02-21 LAB — CBC
HCT: 41.9 % (ref 36.0–46.0)
Hemoglobin: 13.8 g/dL (ref 12.0–15.0)
MCHC: 33 g/dL (ref 30.0–36.0)
MCV: 89.4 fl (ref 78.0–100.0)
Platelets: 268 10*3/uL (ref 150.0–400.0)
RBC: 4.68 Mil/uL (ref 3.87–5.11)
RDW: 14.3 % (ref 11.5–15.5)
WBC: 8 10*3/uL (ref 4.0–10.5)

## 2021-02-21 NOTE — Patient Instructions (Signed)

## 2021-02-21 NOTE — Progress Notes (Signed)
Subjective:   Priscilla Cantrell is a 86 y.o. female who presents for Medicare Annual (Subsequent) preventive examination.  Review of Systems    I connected with  Sherald Barge on 02/22/21 by an audio only telemedicine application and verified that I am speaking with the correct person using two identifiers.   I discussed the limitations, risks, security and privacy concerns of performing an evaluation and management service by telephone and the availability of in person appointments. I also discussed with the patient that there may be a patient responsible charge related to this service. The patient expressed understanding and verbally consented to this telephonic visit.  Location of Patient:  Location of Provider:  List any persons and their role that are participating in the visit with the patient.         Objective:    There were no vitals filed for this visit. There is no height or weight on file to calculate BMI.  Advanced Directives 02/22/2021 12/11/2020 10/29/2020 04/26/2020 02/12/2020 02/01/2020 11/11/2019  Does Patient Have a Medical Advance Directive? Yes No Yes Yes Yes Yes Yes  Type of Advance Directive - - [place];Living will;Out of facility DNR (pink MOST or yellow form) Parole;Out of facility DNR (pink MOST or yellow form);Living will - [place];Living will Living will;Healthcare Power of [place];Out of facility DNR (pink MOST or yellow form)  Does patient want to make changes to medical advance directive? - - - - - - -  Copy of Healthcare Power of Attorney in Chart? - - - - - Yes - validated most recent copy scanned in chart (See row information) -  Would patient like information on creating a medical advance directive? - No - Patient declined - - - - -    Current Medications (verified) Outpatient Encounter Medications as of 02/22/2021  Medication Sig   albuterol (PROAIR HFA) 108 (90 Base) MCG/ACT inhaler  Inhale 2 puffs into the lungs every 4 (four) hours as needed for wheezing or shortness of breath.   alendronate (FOSAMAX) 70 MG tablet TAKE 1 TABLET(70 MG) BY MOUTH 1 TIME A WEEK WITH A FULL GLASS OF WATER AND ON AN EMPTY STOMACH   amLODipine (NORVASC) 10 MG tablet Take 1 tablet (10 mg total) by mouth daily.   Artificial Tear Solution (SOOTHE XP) SOLN Place 1 drop into both eyes 2 (two) times daily as needed (irritation).   aspirin EC 81 MG tablet Take 81 mg by mouth daily.   azelastine (ASTELIN) 0.1 % nasal spray Place 2 sprays into both nostrils 2 (two) times daily. Use in each nostril as directed (Patient not taking: Reported on 12/07/2020)   cetirizine (ZYRTEC) 10 MG tablet Take 1 tablet (10 mg total) by mouth daily. (Patient not taking: Reported on 12/07/2020)   clobetasol (TEMOVATE) 0.05 % external solution APPLY TO THE AFFECTED AREA EVERY DAY   diazepam (VALIUM) 5 MG tablet TAKE 1 TABLET BY MOUTH DAILY AS NEEDED FOR ANXIETY AND INSOMNIA   fluorometholone (FML) 0.1 % ophthalmic suspension Place 1 drop into both eyes as directed. Tapering off   fluticasone (FLONASE) 50 MCG/ACT nasal spray Place 2 sprays into both nostrils daily. (Patient not taking: Reported on 12/07/2020)   Fluticasone-Umeclidin-Vilant (TRELEGY ELLIPTA) 100-62.5-25 MCG/INH AEPB Inhale 1 puff into the lungs daily.   Fluticasone-Umeclidin-Vilant (TRELEGY ELLIPTA) 100-62.5-25 MCG/INH AEPB Inhale 1 puff into the lungs daily.   furosemide (LASIX) 20 MG tablet TAKE 1 TABLET BY MOUTH EVERY DAY  AS NEEDED FOR INCREASED FLUID COLLECTION IN LEGS   nitrofurantoin (MACRODANTIN) 50 MG capsule Take 1 capsule (50 mg total) by mouth at bedtime.   Probiotic Product (PROBIOTIC DAILY PO) Take 1 capsule by mouth daily.    roflumilast (DALIRESP) 500 MCG TABS tablet Take 1 tablet (500 mcg total) by mouth daily.   SYNTHROID 88 MCG tablet TAKE 1 TABLET(88 MCG) BY MOUTH DAILY   triamcinolone cream (KENALOG) 0.1 % Apply 1 application topically 2 (two)  times daily.   venlafaxine (EFFEXOR) 50 MG tablet TAKE 1 TABLET BY MOUTH TWICE DAILY   Vibegron (GEMTESA) 75 MG TABS Take 1 capsule by mouth daily. (Patient not taking: Reported on 12/07/2020)   VISINE 0.05 % ophthalmic solution 1 drop  as needed   vitamin B-12 (CYANOCOBALAMIN) 100 MCG tablet Take 100 mcg by mouth daily.   VITAMIN D PO Take by mouth.   No facility-administered encounter medications on file as of 02/22/2021.    Allergies (verified) Losartan, Penicillins, Cefixime [kdc:cefixime], Gabapentin, Indocin [indomethacin], Singulair [montelukast sodium], Sulfa antibiotics, Ace inhibitors, and Statins   History: Past Medical History:  Diagnosis Date   Allergic rhinitis    with upper airway cough: as of 07/2017 pulm f/u she was instructed to use astelin, flonase, and saline more consistently   BPPV (benign paroxysmal positional vertigo) 03/2018   Chest pain, non-cardiac    Cardiac CT showed no signif obstructive dz, mildly elevated calcium level (Dr. [name]).   Chronic cystitis with hematuria    [name]   Chronic hypoxemic respiratory failure ([place]) 02/02/2015   2L oxygen 24/7   Chronic renal insufficiency, stage 2 (mild)    GFR approx 60   Cold hands    NCS/EMGs normal.  Hand dysesthesias per neuro--reassured   COPD (chronic obstructive pulmonary disease) ([place])    GOLD 4.  spirometry 01/10/09 FEV 0.97(52%), FEV1% 47.  With chronic bronchitis as of 07/2017.   DDD (degenerative disc disease), cervical    1998 MRI C5-6 impingemt (left).  MRI 2021 (neurol) 1 level canal stenosis, 1 level foraminal stenosis--->PT   DDD (degenerative disc disease), lumbar    Depression with anxiety 04/15/2011   Diarrheal disease Summer 2017   ? 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).  As of 10/05/15 pt reports GI dx'd her with C diff and rx'd more flagyl and she is also on  cholestyramine.  As of 02/2016, pt's sx's resolved completely.   Diverticulosis of colon    Procto '97 and colonoscopy 2002   Dysplastic nevus of upper extremity 04/2014   R tricep ([name])   Edema of both lower extremities due to peripheral venous insufficiency    Varicosities bilat, swelling L>R.  R baker's cyst.  Got vein clinic eval 02/2018->sclerotherapy recommended but since no hemorrhage or ulceration, insurance will not cover the procedure.   Family history of adverse reaction to anesthesia    mother died when patient age 22 receiving anesthesia for thyroid surgery   Fibrocystic breast disease    w/fibroadenoma.  Bx's showed NO atypia (Dr. Margot Chimes).  Mammo neg 2008.   GERD (gastroesophageal reflux disease)    History of double vision    Ophthalmologist, Dr. Herbert Deaner, is further evaluating this with MRI  orbits and limited brain (myesthenia gravis testing neg 07/2010)   History of home oxygen therapy    uses 2 liters at hs   Hypertension    EKG 03/2010 normal  Hypothyroidism    Hashimoto's, dx'd 1989   Idiopathic peripheral neuropathy 04/2018   Sensory (numb feet) S1 distribution bilat, c/w spinal stenosis sensory neuropathy (no pain). 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  Labs [place], MRI C spine->some spondylosis/stenosis.  UE NCS/EMS: normal 04/2019.   Lesion of right native kidney 2017   found on CT 02/2015.  F/u MRI 03/31/15 showed it to be smaller and less likely of any significance but repeat CT renal protocol in 62mo was recommended by radiology.  This was done 10/26/15 and showed no interval change in the lesion, but b/c the lesion enhances with contrast, radiol rec'd urol consult (b/c pap renal RCC could not be excluded).  Saw urol 03/06/16--stable on u/s 09/2016 and 09/2017.   OAB (overactive bladder)    Osteoarthritis, hip, bilateral    she is s/p bilat THA as of 02/2018.   Osteoporosis    radius, T score  -2.5->rec'd alendronate 08/2019->plan rpt DEXA 08/2021.   Pneumonia    Postmenopausal atrophic vaginitis    improved with estrace 2 X/week.   Prolapse of vaginal cuff after hysterectomy    10/2018: Dr. Wayne Both. Mild colpocele 04/2019 per GYN (no surgery)   Pulmonary nodule    CT chest 12/10, June 2011.  No nodule on CT 06/2010.   Recurrent Clostridium difficile diarrhea    Episode 09/19/2016: resolved with prolonged course of flagyl.  11/2016 recurrence treated with 10d of Dificid (Fidaxomicin) by GI (Dr. Earlean Shawl).   Recurrent UTI    likely related to pt's atrophic vaginitis.  Urol started vaginal estrace 03/2016.   Rib fracture 09/2018   R 7th, laterally-->sustained when her dog pulled her over and she fell.   RLS (restless legs syndrome)    neuro->gabapentin trial 2020/21   Shingles 02/2014   R abd/flank   Spinal stenosis of lumbar region with neurogenic claudication    2008 MRI (L3-4, L4-5, L5-S1).  +S1 distribution sensory loss bilat   Past Surgical History:  Procedure Laterality Date   ABDOMINAL HYSTERECTOMY  1978   no BSO per pt--nonmalignant reasons   APPENDECTOMY     BREAST CYST EXCISION     Last screening mammogram 10/2010 was normal.   CHOLECYSTECTOMY  2001   COLONOSCOPY  04/2005; 12/06/2015   2007 NORMAL.  2017 adenomatous polyp x 1.  Mod sigmoid diverticulosis (Dr. Earlean Shawl).     DEXA  05/18/2017; 08/7019   2019 NORMAL (T-score 0.1).  08/2019->T score -2.5->alendronate started->plan Rpt 08/2021.   HIP CLOSED REDUCTION Right 12/25/2015   Procedure: CLOSED REDUCTION HIP;  Surgeon: Nicholes Stairs, MD;  Location: [facility];  Service: Orthopedics;  Laterality: Right;   NCV/EMS  10/2018   chronic and symmetric sensorimotor axonal polyneuropathy affecting the lower extremities   TONSILLECTOMY     TOTAL HIP ARTHROPLASTY Right 11/01/2014   Procedure: RIGHT TOTAL HIP ARTHROPLASTY;  Surgeon: Latanya Maudlin, MD;  Location: [facility];  Service: Orthopedics;  Laterality: Right;   TOTAL HIP  ARTHROPLASTY Left 12/08/2017   Procedure: LEFT TOTAL HIP ARTHROPLASTY ANTERIOR APPROACH;  Surgeon: Paralee Cancel, MD;  Location: [facility];  Service: Orthopedics;  Laterality: Left;  70 mins   TRANSTHORACIC ECHOCARDIOGRAM  08/2014   EF 60-65%, grade I diast dysfxn   TUBAL LIGATION  1974   Family History  Problem Relation Age of Onset   Goiter Mother    Psoriasis Father        od liver   Cirrhosis Father    Diabetes Daughter  Type 2   Stroke Maternal Grandfather    Stroke Paternal Grandmother    Hypertension Paternal Grandmother    Hernia Paternal Grandmother    Cancer Paternal Grandfather        lung   Cancer Maternal Aunt        cancer of the stomach   Social History   Socioeconomic History   Marital status: Widowed    Spouse name: Not on file   Number of children: 1   Years of education: Not on file   Highest education level: Not on file  Occupational History   Occupation: SECRETARY    Employer: [employer]  Tobacco Use   Smoking status: Former    Packs/day: 1.50    Years: 34.00    Pack years: 51.00    Types: Cigarettes    Start date: 1958    Quit date: 02/03/1990    Years since quitting: 31.0   Smokeless tobacco: Never  Vaping Use   Vaping Use: Never used  Substance and Sexual Activity   Alcohol use: Yes    Comment: Rarely   Drug use: No   Sexual activity: Not Currently    Comment: 1st intercourse 38 yo-1 partner  Other Topics Concern   Not on file  Social History Narrative   Widowed since fall 2014   Lives in [place], [place].      Worked as Research officer, trade union, retired 2017.   She has support of her daughter and grandson. She is independent with all care needs and transportation at this time. 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    She reports she is on SSI and receives another separate check    She reports her work hx included working in Engineer, mining for [employer] and volunteered in a [employer]    She reports having a short hair dog for 2 years  but denies noting any allergic reactions   Right handed   Two story home   Social Determinants of Radio broadcast assistant Strain: Low Risk    Difficulty of Paying Living Expenses: Not hard at all  Food Insecurity: No Food Insecurity   Worried About Charity fundraiser in the Last Year: Never true   Arboriculturist in the Last Year: Never true  Transportation Needs: No Transportation Needs   Lack of Transportation (Medical): No   Lack of Transportation (Non-Medical): No  Physical Activity: Sufficiently Active   Days of Exercise per Week: 7 days   Minutes of Exercise per Session: 150+ min  Stress: Not on file  Social Connections: Moderately Integrated   Frequency of Communication with Friends and Family: More than three times a week   Frequency of Social Gatherings with Friends and Family: More than three times a week   Attends Religious Services: More than 4 times per year   Active Member of [employer] or Organizations: Yes   Attends Archivist Meetings: More than 4 times per year   Marital Status: Widowed    Tobacco Counseling Counseling given: Not Answered   Clinical Intake:  Pre-visit preparation completed: Yes  Pain : No/denies pain     Nutritional Status: BMI of 19-24  Normal Nutritional Risks: None Diabetes: No  How often do you need to have someone help you when you read instructions, pamphlets, or other written materials from your doctor or pharmacy?: 1 -  Never    Interpreter Needed?: No      Activities of Daily Living In your present state of health, do you have any difficulty performing the following activities: 02/22/2021  Hearing? N  Vision? N  Difficulty concentrating or making decisions? N  Walking or climbing stairs? N  Dressing or bathing? N  Doing errands, shopping? N  Preparing Food and eating ? N  Using the Toilet? N  In the past  six months, have you accidently leaked urine? N  Do you have problems with loss of bowel control? N  Managing your Medications? N  Managing your Finances? N  Housekeeping or managing your Housekeeping? N  Some recent data might be hidden    Patient Care Team: [name], MD as PCP - General [name], Denice Bors, MD as Consulting Physician (Cardiology) Monna Fam, MD as Consulting Physician (Ophthalmology) Chesley Mires, MD as Consulting Physician (Pulmonary Disease) Latanya Maudlin, MD as Consulting Physician (Orthopedic Surgery) McKenzie, [name], MD as Consulting Physician (Urology) Paralee Cancel, MD as Consulting Physician (Orthopedic Surgery) Linus Mako, MD as Consulting Physician (Family Medicine) Lauraine Rinne, NP as Nurse Practitioner (Pulmonary Disease) Alda Berthold, DO as Consulting Physician (Neurology) Phineas Real, Belinda Block, MD (Inactive) as Consulting Physician (Gynecology) Pleasant, Eppie Gibson, RN as [facility] Clark-Burning, Anderson Malta, PA-C (Inactive) (Dermatology) Warren Danes, PA-C as Physician Assistant (Dermatology) Rolene Course, PA-C as Consulting Physician (Gastroenterology) Rolene Course, PA-C as Physician Assistant (Gastroenterology) Lavonna Monarch, MD as Consulting Physician (Dermatology) Alda Berthold, DO as Consulting Physician (Neurology)  Indicate any recent Medical Services you may have received from other than [facility] providers in the past year (date may be approximate).     Assessment:   This is a routine wellness examination for Priscilla Cantrell.  Hearing/Vision screen No results found.  Dietary issues and exercise activities discussed: Current Exercise Habits: Home exercise routine, Type of exercise: walking, Time (Minutes): > 60, Frequency (Times/Week): 2, Weekly Exercise (Minutes/Week): 0, Intensity: Mild   Goals Addressed   None    Depression Screen PHQ 2/9 Scores 02/22/2021 12/13/2020  02/01/2020 01/12/2020 04/05/2019 01/19/2019 11/03/2018  PHQ - 2 Score 0 0 1 0 1 0 0  PHQ- 9 Score - - - - - - -    Fall Risk Fall Risk  02/22/2021 12/21/2020 12/13/2020 10/29/2020 09/20/2020  Falls in the past year? 1 1 1 1 1   Number falls in past yr: 0 0 0 1 0  Injury with Fall? 1 1 1  0 1  Comment - - - - -  Risk for fall due to : History of fall(s) History of fall(s) - - History of fall(s)  Risk for fall due to: Comment - - - - -  Follow up Falls evaluation completed Falls evaluation completed Falls evaluation completed - Falls evaluation completed  Comment - - - - -    FALL RISK PREVENTION PERTAINING TO THE HOME:  Any stairs in or around the home? Yes  If so, are there any without handrails? Yes  Home free of loose throw rugs in walkways, pet beds, electrical cords, etc? No  Adequate lighting in your home to reduce risk of falls? Yes   ASSISTIVE DEVICES UTILIZED TO PREVENT FALLS:  Life alert? Yes  Use of a cane, walker or w/c? No  Grab bars in the bathroom? Yes  Shower chair or bench in shower? Yes  Elevated toilet seat or a handicapped toilet? Yes   TIMED UP AND GO:  Was  the test performed?  n/a .  Length of time to ambulate 10 feet: n/a sec.     Cognitive Function:     6CIT Screen 02/22/2021  What Year? 0 points  What month? 0 points  What time? 0 points  Count back from 20 0 points  Months in reverse 0 points  Repeat phrase 0 points  Total Score 0    Immunizations Immunization History  Administered Date(s) Administered   Fluad Quad(high Dose 65+) 10/05/2018, 10/25/2019, 12/07/2020   Influenza Split 11/04/2010, 10/23/2011   Influenza Whole 10/04/2009   Influenza, High Dose Seasonal PF 11/07/2016, 11/04/2017   Influenza,inj,Quad PF,6+ Mos 10/07/2012, 10/11/2014   Influenza-Unspecified 11/07/2013, 11/01/2015   Moderna Sars-Covid-2 Vaccination 02/14/2019, 03/15/2019, 12/15/2019   PNEUMOCOCCAL CONJUGATE-20 07/17/2020   Pfizer Covid-19 Vaccine Bivalent Booster  22yrs & up 12/19/2020   Pneumococcal Conjugate-13 04/29/2013   Pneumococcal Polysaccharide-23 02/03/2005, 11/28/2010, 11/01/2018   Td 02/04/2008, 03/07/2010   Tdap 03/07/2020   Zoster Recombinat (Shingrix) 10/30/2016, 12/29/2016   Zoster, Live 02/10/2013    TDAP status: Up to date  Flu Vaccine status: Up to date  Pneumococcal vaccine status: Up to date  Covid-19 vaccine status: Information provided on how to obtain vaccines.   Qualifies for Shingles Vaccine? No   Zostavax completed No   Shingrix Completed?: Yes  Screening Tests Health Maintenance  Topic Date Due   MAMMOGRAM  09/03/2021   TETANUS/TDAP  03/07/2030   Pneumonia Vaccine 73+ Years old  Completed   INFLUENZA VACCINE  Completed   DEXA SCAN  Completed   COVID-19 Vaccine  Completed   Zoster Vaccines- Shingrix  Completed   HPV VACCINES  Aged Out    Health Maintenance  There are no preventive care reminders to display for this patient.  Colorectal cancer screening: Type of screening: Colonoscopy. Completed 12/16/2015. Repeat every 10 years  Mammogram status: Completed 09/03/20. Repeat every year  Bone Density status: Completed 08/31/19. Results reflect: Bone density results: NORMAL. Repeat every 2 years.  Lung Cancer Screening: (Low Dose CT Chest recommended if Age 26-80 years, 30 pack-year currently smoking OR have quit w/in 15years.) does not qualify.   Lung Cancer Screening Referral: n/a  Additional Screening:  Hepatitis C Screening: does not qualify; Completed n/a  Vision Screening: Recommended annual ophthalmology exams for early detection of glaucoma and other disorders of the eye. Is the patient up to date with their annual eye exam?  Yes  Who is the provider or what is the name of the office in which the patient attends annual eye exams? [facility] If pt is not established with a provider, would they like to be referred to a provider to establish care? No .   Dental Screening: Recommended  annual dental exams for proper oral hygiene  Community Resource Referral / Chronic Care Management: CRR required this visit?  No   CCM required this visit?  No      Plan:     I have personally reviewed and noted the following in the patients chart:   Medical and social history Use of alcohol, tobacco or illicit drugs  Current medications and supplements including opioid prescriptions.  Functional ability and status Nutritional status Physical activity Advanced directives List of other physicians Hospitalizations, surgeries, and ER visits in previous 12 months Vitals Screenings to include cognitive, depression, and falls Referrals and appointments  In addition, I have reviewed and discussed with patient certain preventive protocols, quality metrics, and best practice recommendations. A written personalized care plan for preventive  services as well as general preventive health recommendations were provided to patient.     Octaviano Glow, CMA   02/22/2021   Nurse Notes: Non-Face to Face or Face to Face 10 minute visit Encounter   Priscilla Cantrell , Thank you for taking time to come for your Medicare Wellness Visit. I appreciate your ongoing commitment to your health goals. Please review the following plan we discussed and let me know if I can assist you in the future.   These are the goals we discussed:  Goals       patient (pt-stated)      Maintain current health by staying active.         This is a list of the screening recommended for you and due dates:  Health Maintenance  Topic Date Due   Mammogram  09/03/2021   Tetanus Vaccine  03/07/2030   Pneumonia Vaccine  Completed   Flu Shot  Completed   DEXA scan (bone density measurement)  Completed   COVID-19 Vaccine  Completed   Zoster (Shingles) Vaccine  Completed   HPV Vaccine  Aged Out

## 2021-02-21 NOTE — Progress Notes (Deleted)
Subjective:   Priscilla Cantrell is a 86 y.o. female who presents for Medicare Annual (Subsequent) preventive examination.  Review of Systems    ***       Objective:    There were no vitals filed for this visit. There is no height or weight on file to calculate BMI.  Advanced Directives 12/11/2020 10/29/2020 04/26/2020 02/12/2020 02/01/2020 11/11/2019 02/10/2019  Does Patient Have a Medical Advance Directive? No Yes Yes Yes Yes Yes Yes  Type of Advance Directive - [place];Living will;Out of facility DNR (pink MOST or yellow form) [employer];Out of facility DNR (pink MOST or yellow form);Living will - [place];Living will Living will;Healthcare Power of [place];Out of facility DNR (pink MOST or yellow form) [place];Living will  Does patient want to make changes to medical advance directive? - - - - - - -  Copy of [place] in Chart? - - - - Yes - validated most recent copy scanned in chart (See row information) - -  Would patient like information on creating a medical advance directive? No - Patient declined - - - - - -    Current Medications (verified) Outpatient Encounter Medications as of 02/21/2021  Medication Sig   albuterol (PROAIR HFA) 108 (90 Base) MCG/ACT inhaler Inhale 2 puffs into the lungs every 4 (four) hours as needed for wheezing or shortness of breath.   alendronate (FOSAMAX) 70 MG tablet TAKE 1 TABLET(70 MG) BY MOUTH 1 TIME A WEEK WITH A FULL GLASS OF WATER AND ON AN EMPTY STOMACH   amLODipine (NORVASC) 10 MG tablet Take 1 tablet (10 mg total) by mouth daily.   Artificial Tear Solution (SOOTHE XP) SOLN Place 1 drop into both eyes 2 (two) times daily as needed (irritation).   aspirin EC 81 MG tablet Take 81 mg by mouth daily.   azelastine (ASTELIN) 0.1 % nasal spray Place 2 sprays into both nostrils 2 (two) times daily. Use in each nostril as directed (Patient not taking: Reported  on 12/07/2020)   cetirizine (ZYRTEC) 10 MG tablet Take 1 tablet (10 mg total) by mouth daily. (Patient not taking: Reported on 12/07/2020)   clobetasol (TEMOVATE) 0.05 % external solution APPLY TO THE AFFECTED AREA EVERY DAY   diazepam (VALIUM) 5 MG tablet TAKE 1 TABLET BY MOUTH DAILY AS NEEDED FOR ANXIETY AND INSOMNIA   fluorometholone (FML) 0.1 % ophthalmic suspension Place 1 drop into both eyes as directed. Tapering off   fluticasone (FLONASE) 50 MCG/ACT nasal spray Place 2 sprays into both nostrils daily. (Patient not taking: Reported on 12/07/2020)   Fluticasone-Umeclidin-Vilant (TRELEGY ELLIPTA) 100-62.5-25 MCG/INH AEPB Inhale 1 puff into the lungs daily.   Fluticasone-Umeclidin-Vilant (TRELEGY ELLIPTA) 100-62.5-25 MCG/INH AEPB Inhale 1 puff into the lungs daily.   furosemide (LASIX) 20 MG tablet TAKE 1 TABLET BY MOUTH EVERY DAY AS NEEDED FOR INCREASED FLUID COLLECTION IN LEGS   nitrofurantoin (MACRODANTIN) 50 MG capsule Take 1 capsule (50 mg total) by mouth at bedtime.   Probiotic Product (PROBIOTIC DAILY PO) Take 1 capsule by mouth daily.    roflumilast (DALIRESP) 500 MCG TABS tablet Take 1 tablet (500 mcg total) by mouth daily.   SYNTHROID 88 MCG tablet TAKE 1 TABLET(88 MCG) BY MOUTH DAILY   triamcinolone cream (KENALOG) 0.1 % Apply 1 application topically 2 (two) times daily.   venlafaxine (EFFEXOR) 50 MG tablet TAKE 1 TABLET BY MOUTH TWICE DAILY   Vibegron (GEMTESA) 75 MG TABS  Take 1 capsule by mouth daily. (Patient not taking: Reported on 12/07/2020)   VISINE 0.05 % ophthalmic solution 1 drop  as needed   vitamin B-12 (CYANOCOBALAMIN) 100 MCG tablet Take 100 mcg by mouth daily.   VITAMIN D PO Take by mouth.   No facility-administered encounter medications on file as of 02/21/2021.    Allergies (verified) Losartan, Penicillins, Cefixime [kdc:cefixime], Gabapentin, Indocin [indomethacin], Singulair [montelukast sodium], Sulfa antibiotics, Ace inhibitors, and Statins   History: Past  Medical History:  Diagnosis Date   Allergic rhinitis    with upper airway cough: as of 07/2017 pulm f/u she was instructed to use astelin, flonase, and saline more consistently   BPPV (benign paroxysmal positional vertigo) 03/2018   Chest pain, non-cardiac    Cardiac CT showed no signif obstructive dz, mildly elevated calcium level (Dr. [name]).   Chronic cystitis with hematuria    [name]   Chronic hypoxemic respiratory failure ([place]) 02/02/2015   2L oxygen 24/7   Chronic renal insufficiency, stage 2 (mild)    GFR approx 60   Cold hands    NCS/EMGs normal.  Hand dysesthesias per neuro--reassured   COPD (chronic obstructive pulmonary disease) ([place])    GOLD 4.  spirometry 01/10/09 FEV 0.97(52%), FEV1% 47.  With chronic bronchitis as of 07/2017.   DDD (degenerative disc disease), cervical    1998 MRI C5-6 impingemt (left).  MRI 2021 (neurol) 1 level canal stenosis, 1 level foraminal stenosis--->PT   DDD (degenerative disc disease), lumbar    Depression with anxiety 04/15/2011   Diarrheal disease Summer 2017   ? 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).  As of 10/05/15 pt reports GI dx'd her with C diff and rx'd more flagyl and she is also on cholestyramine.  As of 02/2016, pt's sx's resolved completely.   Diverticulosis of colon    Procto '97 and colonoscopy 2002   Dysplastic nevus of upper extremity 04/2014   R tricep ([name])   Edema of both lower extremities due to peripheral venous insufficiency    Varicosities bilat, swelling L>R.  R baker's cyst.  Got vein clinic eval 02/2018->sclerotherapy recommended but since no hemorrhage or ulceration, insurance will not cover the procedure.   Family history of adverse reaction to anesthesia    mother died when patient age 64 receiving anesthesia for thyroid surgery   Fibrocystic breast disease    w/fibroadenoma.  Bx's showed NO  atypia (Dr. Margot Chimes).  Mammo neg 2008.   GERD (gastroesophageal reflux disease)    History of double vision    Ophthalmologist, Dr. Herbert Deaner, is further evaluating this with MRI  orbits and limited brain (myesthenia gravis testing neg 07/2010)   History of home oxygen therapy    uses 2 liters at hs   Hypertension    EKG 03/2010 normal   Hypothyroidism    Hashimoto's, dx'd 1989   Idiopathic peripheral neuropathy 04/2018   Sensory (numb feet) S1 distribution bilat, c/w spinal stenosis sensory neuropathy (no pain). 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  Labs [place], MRI C spine->some spondylosis/stenosis.  UE NCS/EMS: normal 04/2019.   Lesion of right native kidney 2017   found on CT 02/2015.  F/u MRI 03/31/15 showed it to be smaller and less likely of any significance but repeat CT renal protocol in 42mo was recommended by radiology.  This was done 10/26/15 and showed no interval change in the  lesion, but b/c the lesion enhances with contrast, radiol rec'd urol consult (b/c pap renal RCC could not be excluded).  Saw urol 03/06/16--stable on u/s 09/2016 and 09/2017.   OAB (overactive bladder)    Osteoarthritis, hip, bilateral    she is s/p bilat THA as of 02/2018.   Osteoporosis    radius, T score -2.5->rec'd alendronate 08/2019->plan rpt DEXA 08/2021.   Pneumonia    Postmenopausal atrophic vaginitis    improved with estrace 2 X/week.   Prolapse of vaginal cuff after hysterectomy    10/2018: Dr. Wayne Both. Mild colpocele 04/2019 per GYN (no surgery)   Pulmonary nodule    CT chest 12/10, June 2011.  No nodule on CT 06/2010.   Recurrent Clostridium difficile diarrhea    Episode 09/19/2016: resolved with prolonged course of flagyl.  11/2016 recurrence treated with 10d of Dificid (Fidaxomicin) by GI (Dr. Earlean Shawl).   Recurrent UTI    likely related to pt's atrophic vaginitis.  Urol started vaginal estrace 03/2016.   Rib fracture 09/2018   R 7th,  laterally-->sustained when her dog pulled her over and she fell.   RLS (restless legs syndrome)    neuro->gabapentin trial 2020/21   Shingles 02/2014   R abd/flank   Spinal stenosis of lumbar region with neurogenic claudication    2008 MRI (L3-4, L4-5, L5-S1).  +S1 distribution sensory loss bilat   Past Surgical History:  Procedure Laterality Date   ABDOMINAL HYSTERECTOMY  1978   no BSO per pt--nonmalignant reasons   APPENDECTOMY     BREAST CYST EXCISION     Last screening mammogram 10/2010 was normal.   CHOLECYSTECTOMY  2001   COLONOSCOPY  04/2005; 12/06/2015   2007 NORMAL.  2017 adenomatous polyp x 1.  Mod sigmoid diverticulosis (Dr. Earlean Shawl).     DEXA  05/18/2017; 08/7019   2019 NORMAL (T-score 0.1).  08/2019->T score -2.5->alendronate started->plan Rpt 08/2021.   HIP CLOSED REDUCTION Right 12/25/2015   Procedure: CLOSED REDUCTION HIP;  Surgeon: Nicholes Stairs, MD;  Location: [facility];  Service: Orthopedics;  Laterality: Right;   NCV/EMS  10/2018   chronic and symmetric sensorimotor axonal polyneuropathy affecting the lower extremities   TONSILLECTOMY     TOTAL HIP ARTHROPLASTY Right 11/01/2014   Procedure: RIGHT TOTAL HIP ARTHROPLASTY;  Surgeon: Latanya Maudlin, MD;  Location: [facility];  Service: Orthopedics;  Laterality: Right;   TOTAL HIP ARTHROPLASTY Left 12/08/2017   Procedure: LEFT TOTAL HIP ARTHROPLASTY ANTERIOR APPROACH;  Surgeon: Paralee Cancel, MD;  Location: [facility];  Service: Orthopedics;  Laterality: Left;  70 mins   TRANSTHORACIC ECHOCARDIOGRAM  08/2014   EF 60-65%, grade I diast dysfxn   TUBAL LIGATION  1974   Family History  Problem Relation Age of Onset   Goiter Mother    Psoriasis Father        od liver   Cirrhosis Father    Diabetes Daughter        Type 2   Stroke Maternal Grandfather    Stroke Paternal Grandmother    Hypertension Paternal Grandmother    Hernia Paternal Grandmother    Cancer Paternal Grandfather        lung   Cancer Maternal Aunt         cancer of the stomach   Social History   Socioeconomic History   Marital status: Widowed    Spouse name: Not on file   Number of children: 1   Years of education: Not on file   Highest education level: Not  on file  Occupational History   Occupation: Producer, television/film/video: [employer]  Tobacco Use   Smoking status: Former    Packs/day: 1.50    Years: 34.00    Pack years: 51.00    Types: Cigarettes    Start date: 1958    Quit date: 02/03/1990    Years since quitting: 31.0   Smokeless tobacco: Never  Vaping Use   Vaping Use: Never used  Substance and Sexual Activity   Alcohol use: Yes    Comment: Rarely   Drug use: No   Sexual activity: Not Currently    Comment: 1st intercourse 65 yo-1 partner  Other Topics Concern   Not on file  Social History Narrative   Widowed since fall 2014   Lives in [place], [place].      Worked as Research officer, trade union, retired 2017.   She has support of her daughter and grandson. She is independent with all care needs and transportation at this time. 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    She reports she is on SSI and receives another separate check    She reports her work hx included working in Engineer, mining for [employer] and volunteered in a [employer]    She reports having a short hair dog for 2 years  but denies noting any allergic reactions   Right handed   Two story home   Social Determinants of Radio broadcast assistant Strain: Not on file  Food Insecurity: No Food Insecurity   Worried About Charity fundraiser in the Last Year: Never true   Arboriculturist in the Last Year: Never true  Transportation Needs: No Transportation Needs   Lack of Transportation (Medical): No   Lack of Transportation (Non-Medical): No  Physical Activity: Inactive   Days of Exercise per Week: 0 days   Minutes of Exercise per Session: 30 min  Stress:  Not on file  Social Connections: Moderately Integrated   Frequency of Communication with Friends and Family: More than three times a week   Frequency of Social Gatherings with Friends and Family: More than three times a week   Attends Religious Services: More than 4 times per year   Active Member of [employer] or Organizations: Yes   Attends Archivist Meetings: More than 4 times per year   Marital Status: Widowed    Tobacco Counseling Counseling given: Not Answered   Clinical Intake:  Pre-visit preparation completed: Yes        Nutritional Status: BMI of 19-24  Normal Nutritional Risks: None Diabetes: No             Activities of Daily Living No flowsheet data found.  Patient Care Team: [name], MD as PCP - General [name], Denice Bors, MD as Consulting Physician (Cardiology) Monna Fam, MD as Consulting Physician (Ophthalmology) Chesley Mires, MD as Consulting Physician (Pulmonary Disease) Latanya Maudlin, MD as Consulting Physician (Orthopedic Surgery) McKenzie, [name], MD as Consulting Physician (Urology) Paralee Cancel, MD as Consulting Physician (Orthopedic Surgery) Linus Mako, MD as Consulting Physician (Family Medicine) Lauraine Rinne, NP as Nurse Practitioner (Pulmonary Disease) Alda Berthold, DO as Consulting Physician (Neurology) Fontaine, Belinda Block, MD (Inactive) as Consulting Physician (Gynecology) Pleasant, Eppie Gibson, RN as [place] [facility] Clark-Burning, Anderson Malta, PA-C (Inactive) (Dermatology) Starlyn Skeans as Physician Assistant (Dermatology)  Rolene Course, PA-C as Consulting Physician (Gastroenterology) Rolene Course, PA-C as Physician Assistant (Gastroenterology) Lavonna Monarch, MD as Consulting Physician (Dermatology) Alda Berthold, DO as Consulting Physician (Neurology)  Indicate any recent Medical Services you may have received from other than [facility] providers in the past  year (date may be approximate).     Assessment:   This is a routine wellness examination for Priscilla Cantrell.  Hearing/Vision screen No results found.  Dietary issues and exercise activities discussed:     Goals Addressed   None    Depression Screen PHQ 2/9 Scores 12/13/2020 02/01/2020 01/12/2020 04/05/2019 01/19/2019 11/03/2018 11/03/2018  PHQ - 2 Score 0 1 0 1 0 0 0  PHQ- 9 Score - - - - - - -    Fall Risk Fall Risk  12/21/2020 12/13/2020 10/29/2020 09/20/2020 06/15/2020  Falls in the past year? 1 1 1 1 1   Number falls in past yr: 0 0 1 0 0  Injury with Fall? 1 1 0 1 1  Comment - - - - -  Risk for fall due to : History of fall(s) - - History of fall(s) History of fall(s)  Risk for fall due to: Comment - - - - -  Follow up Falls evaluation completed Falls evaluation completed - Falls evaluation completed Falls evaluation completed  Comment - - - - -    FALL RISK PREVENTION PERTAINING TO THE HOME:  Any stairs in or around the home? {YES/NO:21197} If so, are there any without handrails? {YES/NO:21197} Home free of loose throw rugs in walkways, pet beds, electrical cords, etc? {YES/NO:21197} Adequate lighting in your home to reduce risk of falls? {YES/NO:21197}  ASSISTIVE DEVICES UTILIZED TO PREVENT FALLS:  Life alert? {YES/NO:21197} Use of a cane, walker or w/c? No  Grab bars in the bathroom? {YES/NO:21197} Shower chair or bench in shower? {YES/NO:21197} Elevated toilet seat or a handicapped toilet? {YES/NO:21197}  TIMED UP AND GO:  Was the test performed?  N/A .  Length of time to ambulate 10 feet: N/A sec.     Cognitive Function:        Immunizations Immunization History  Administered Date(s) Administered   Fluad Quad(high Dose 65+) 10/05/2018, 10/25/2019, 12/07/2020   Influenza Split 11/04/2010, 10/23/2011   Influenza Whole 10/04/2009   Influenza, High Dose Seasonal PF 11/07/2016, 11/04/2017   Influenza,inj,Quad PF,6+ Mos 10/07/2012, 10/11/2014    Influenza-Unspecified 11/07/2013, 11/01/2015   Moderna Sars-Covid-2 Vaccination 02/14/2019, 03/15/2019, 12/15/2019   PNEUMOCOCCAL CONJUGATE-20 07/17/2020   Pfizer Covid-19 Vaccine Bivalent Booster 51yrs & up 12/19/2020   Pneumococcal Conjugate-13 04/29/2013   Pneumococcal Polysaccharide-23 02/03/2005, 11/28/2010, 11/01/2018   Td 02/04/2008, 03/07/2010   Tdap 03/07/2020   Zoster Recombinat (Shingrix) 10/30/2016, 12/29/2016   Zoster, Live 02/10/2013    TDAP status: Up to date  Flu Vaccine status: Up to date  Pneumococcal vaccine status: Up to date  Covid-19 vaccine status: Information provided on how to obtain vaccines.   Qualifies for Shingles Vaccine? No   Zostavax completed No   Shingrix Completed?: Yes  Screening Tests Health Maintenance  Topic Date Due   MAMMOGRAM  09/03/2021   TETANUS/TDAP  03/07/2030   Pneumonia Vaccine 32+ Years old  Completed   INFLUENZA VACCINE  Completed   DEXA SCAN  Completed   COVID-19 Vaccine  Completed   Zoster Vaccines- Shingrix  Completed   HPV VACCINES  Aged Out    Health Maintenance  There are no preventive care reminders to display for this patient.  Colorectal cancer screening: Type of  screening: Colonoscopy. Completed 12/06/2015. Repeat every 10 years  Mammogram status: Completed 09/03/2020. Repeat every year  Bone Density status: Completed 08/31/19. Results reflect: Bone density results: NORMAL. Repeat every 2 years.  Lung Cancer Screening: (Low Dose CT Chest recommended if Age 80-80 years, 30 pack-year currently smoking OR have quit w/in 15years.) does not qualify.   Lung Cancer Screening Referral: n/a  Additional Screening:  Hepatitis C Screening: does not qualify; Completed n/a  Vision Screening: Recommended annual ophthalmology exams for early detection of glaucoma and other disorders of the eye. Is the patient up to date with their annual eye exam?  {YES/NO:21197} Who is the provider or what is the name of the office  in which the patient attends annual eye exams? *** If pt is not established with a provider, would they like to be referred to a provider to establish care? {YES/NO:21197}.   Dental Screening: Recommended annual dental exams for proper oral hygiene  Community Resource Referral / Chronic Care Management: CRR required this visit?  No   CCM required this visit?  No      Plan:     I have personally reviewed and noted the following in the patients chart:   Medical and social history Use of alcohol, tobacco or illicit drugs  Current medications and supplements including opioid prescriptions.  Functional ability and status Nutritional status Physical activity Advanced directives List of other physicians Hospitalizations, surgeries, and ER visits in previous 12 months Vitals Screenings to include cognitive, depression, and falls Referrals and appointments  In addition, I have reviewed and discussed with patient certain preventive protocols, quality metrics, and best practice recommendations. A written personalized care plan for preventive services as well as general preventive health recommendations were provided to patient.     Octaviano Glow, CMA   02/21/2021   Nurse Notes: Non-Face to Face or Face to Face __minute visit Encounter   Priscilla Cantrell , Thank you for taking time to come for your Medicare Wellness Visit. I appreciate your ongoing commitment to your health goals. Please review the following plan we discussed and let me know if I can assist you in the future.   These are the goals we discussed:  Goals       patient (pt-stated)      Maintain current health by staying active.         This is a list of the screening recommended for you and due dates:  Health Maintenance  Topic Date Due   Mammogram  09/03/2021   Tetanus Vaccine  03/07/2030   Pneumonia Vaccine  Completed   Flu Shot  Completed   DEXA scan (bone density measurement)  Completed   COVID-19 Vaccine   Completed   Zoster (Shingles) Vaccine  Completed   HPV Vaccine  Aged Out

## 2021-02-22 ENCOUNTER — Ambulatory Visit (INDEPENDENT_AMBULATORY_CARE_PROVIDER_SITE_OTHER): Payer: Medicare Other

## 2021-02-22 ENCOUNTER — Other Ambulatory Visit: Payer: Self-pay

## 2021-02-22 ENCOUNTER — Telehealth: Payer: Self-pay

## 2021-02-22 DIAGNOSIS — R197 Diarrhea, unspecified: Secondary | ICD-10-CM

## 2021-02-22 DIAGNOSIS — Z0000 Encounter for general adult medical examination without abnormal findings: Secondary | ICD-10-CM

## 2021-02-22 DIAGNOSIS — R7989 Other specified abnormal findings of blood chemistry: Secondary | ICD-10-CM

## 2021-02-22 NOTE — Telephone Encounter (Signed)
-----   Message from [name], MD sent at 02/21/2021  4:19 PM EST ----- Her kidney function is down a little.  I suspect this is d/t recent lasix use but also could be a normal brief fluctuation down that can happen normally and is only temporary. Either way, I recommend she limit lasix use to 3 days a week, start wearing compression hose at least 4 hours a day, continue to limit sodium intake. Nonfasting lab visit 7-10d for bmet, dx elevated serum creatinine. All blood counts normal.

## 2021-02-28 LAB — CLOSTRIDIUM DIFFICILE TOXIN B, QUALITATIVE, REAL-TIME PCR: Toxigenic C. Difficile by PCR: NOT DETECTED

## 2021-03-01 ENCOUNTER — Other Ambulatory Visit: Payer: Self-pay

## 2021-03-01 ENCOUNTER — Ambulatory Visit (INDEPENDENT_AMBULATORY_CARE_PROVIDER_SITE_OTHER): Payer: Medicare Other

## 2021-03-01 ENCOUNTER — Telehealth: Payer: Self-pay

## 2021-03-01 ENCOUNTER — Telehealth: Payer: Self-pay | Admitting: Pulmonary Disease

## 2021-03-01 DIAGNOSIS — R7989 Other specified abnormal findings of blood chemistry: Secondary | ICD-10-CM

## 2021-03-01 LAB — BASIC METABOLIC PANEL
BUN: 23 mg/dL (ref 6–23)
CO2: 27 mEq/L (ref 19–32)
Calcium: 9.5 mg/dL (ref 8.4–10.5)
Chloride: 104 mEq/L (ref 96–112)
Creatinine, Ser: 1 mg/dL (ref 0.40–1.20)
GFR: 51.98 mL/min — ABNORMAL LOW (ref >60.00)
Glucose, Bld: 81 mg/dL (ref 70–99)
Potassium: 4.1 mEq/L (ref 3.5–5.1)
Sodium: 140 mEq/L (ref 135–145)

## 2021-03-01 NOTE — Telephone Encounter (Signed)
Pt states she was having some kind of reaction to Daliresp.  Had diarrhea, no appetite, not aware of surroundings.  Felt different.  Hasn't taken medication since Wednesday.    ATC patient for more info. No answer.  [name] please advise

## 2021-03-01 NOTE — Progress Notes (Signed)
Per the orders of Dr. Anitra Lauth pt is here for  repeat labs, BMET done.

## 2021-03-01 NOTE — Telephone Encounter (Signed)
ATC patient. No answer. Left detailed message for patient (OK PER DPR). Advised her to call us back and let us know what she is planning to do and if shed like to make an appt.

## 2021-03-01 NOTE — Telephone Encounter (Signed)
She has been on daliresp for several years.  Not sure the reaction was related to daliresp.  Having said that, if her breathing symptoms are okay, then she can stay off of daliresp.  If her breathing symptoms get worse, then please schedule an ROV to assess treatment options further.

## 2021-03-03 MED ORDER — VENLAFAXINE HCL 50 MG PO TABS: 50.0000 mg | ORAL_TABLET | Freq: Two times a day (BID) | ORAL | 3 refills | Status: DC

## 2021-03-03 NOTE — Telephone Encounter (Signed)
Venlafaxine eRx'd

## 2021-03-04 ENCOUNTER — Telehealth: Payer: Self-pay

## 2021-03-04 NOTE — Telephone Encounter (Signed)
Spoke with pt regarding results/recommendations,voiced understanding. ? ?

## 2021-03-04 NOTE — Telephone Encounter (Signed)
Patient calling about lab results from last week.  Please call patient on cell phone [phone].

## 2021-03-04 NOTE — Telephone Encounter (Signed)
Pt was advised of medication refill.

## 2021-03-05 ENCOUNTER — Telehealth: Payer: Self-pay | Admitting: Pulmonary Disease

## 2021-03-05 NOTE — Telephone Encounter (Signed)
Spoke to patient.  Patient stated that she woke up this morning with her right ear stopped up.  Denied f/c/s, cough, wheezing, sob or additional sx.  Recommended that she contact PCP.   Routing to [name] as an Jul[place].

## 2021-03-06 ENCOUNTER — Encounter: Payer: Self-pay | Admitting: Family Medicine

## 2021-03-06 ENCOUNTER — Other Ambulatory Visit: Payer: Self-pay | Admitting: *Deleted

## 2021-03-06 ENCOUNTER — Ambulatory Visit (INDEPENDENT_AMBULATORY_CARE_PROVIDER_SITE_OTHER): Payer: Medicare Other | Admitting: Family Medicine

## 2021-03-06 ENCOUNTER — Other Ambulatory Visit: Payer: Self-pay

## 2021-03-06 VITALS — BP 179/92 | HR 60 | Temp 97.4°F | Ht 60.0 in | Wt 129.0 lb

## 2021-03-06 DIAGNOSIS — H9191 Unspecified hearing loss, right ear: Secondary | ICD-10-CM

## 2021-03-06 DIAGNOSIS — H6121 Impacted cerumen, right ear: Secondary | ICD-10-CM

## 2021-03-06 NOTE — Patient Outreach (Signed)
[facility]) Care Management RN Health Coach Note   03/06/2021 Name:  Priscilla Cantrell MRN:  [id number] DOB:  12-28-1935  Summary: Per patient no admissions for COPD exacerbation.Patient is only using oxygen at hs. She is utilizing his inhalers as per ordered.  Patient is working part- time. She is socializing and is feeling much better mentally. Per patient she is having some problem with hearing.  She is going to Dr today for hearing to be checked out.   Recommendations/Changes made from today's visit: Use inhalers as per ordered Follow up with Dr on hearing problems Monitor BP once weekly   Subjective: Priscilla Cantrell is an 86 y.o. year old female who is a primary patient of McGowen, Adrian Blackwater, MD. The care management team was consulted for assistance with care management and/or care coordination needs.    RN Health Coach completed Telephone Visit today.   Objective:  Medications Reviewed Today     Reviewed by [name], MD (Physician) on 02/20/21 at [place] List Status: <None>   Medication Order Taking? Sig Documenting Provider Last Dose Status Informant  albuterol (PROAIR HFA) 108 (90 Base) MCG/ACT inhaler [id number] Yes Inhale 2 puffs into the lungs every 4 (four) hours as needed for wheezing or shortness of breath. [name], MD Taking Active   alendronate (FOSAMAX) 70 MG tablet [id number] Yes TAKE 1 TABLET(70 MG) BY MOUTH 1 TIME A WEEK WITH A FULL GLASS OF WATER AND ON AN EMPTY STOMACH McGowen, Adrian Blackwater, MD Taking Active   amLODipine (NORVASC) 10 MG tablet [id number] Yes Take 1 tablet (10 mg total) by mouth daily. [name], MD Taking Active   Artificial Tear Solution (SOOTHE XP) SOLN [id number] Yes Place 1 drop into both eyes 2 (two) times daily as needed (irritation). [provider] Taking Active Self  aspirin EC 81 MG tablet [id number] Yes Take 81 mg by mouth daily. [provider] Taking Active   azelastine (ASTELIN)  0.1 % nasal spray [id number] No Place 2 sprays into both nostrils 2 (two) times daily. Use in each nostril as directed  Patient not taking: Reported on 12/07/2020   [name], MD Not Taking Active   cetirizine (ZYRTEC) 10 MG tablet [id number] No Take 1 tablet (10 mg total) by mouth daily.  Patient not taking: Reported on 12/07/2020   [name], MD Not Taking Active   clobetasol (TEMOVATE) 0.05 % external solution [id number] Yes APPLY TO THE AFFECTED AREA EVERY DAY Sheffield, Kelli R, PA-C Taking Active   diazepam (VALIUM) 5 MG tablet [id number] Yes TAKE 1 TABLET BY MOUTH DAILY AS NEEDED FOR ANXIETY AND INSOMNIA McGowen, Adrian Blackwater, MD Taking Active   fluorometholone (FML) 0.1 % ophthalmic suspension [id number] Yes Place 1 drop into both eyes as directed. Tapering off [provider] Taking Active   fluticasone (FLONASE) 50 MCG/ACT nasal spray [id number] No Place 2 sprays into both nostrils daily.  Patient not taking: Reported on 12/07/2020   [name], MD Not Taking Active   Fluticasone-Umeclidin-Vilant [facility] ELLIPTA) 100-62.5-25 MCG/INH AEPB [id number] Yes Inhale 1 puff into the lungs daily. Chesley Mires, MD Taking Active   Fluticasone-Umeclidin-Vilant (TRELEGY ELLIPTA) 100-62.5-25 MCG/INH AEPB [id number] Yes Inhale 1 puff into the lungs daily. Chesley Mires, MD Taking Active   furosemide (LASIX) 20 MG tablet [id number] Yes TAKE 1 TABLET BY MOUTH EVERY DAY AS NEEDED FOR INCREASED FLUID COLLECTION IN LEGS McGowen, Adrian Blackwater, MD Taking Active   nitrofurantoin (MACRODANTIN)  50 MG capsule [id number] Yes Take 1 capsule (50 mg total) by mouth at bedtime. McKenzie, [name], MD Taking Active   Probiotic Product (PROBIOTIC DAILY PO) [id number] Yes Take 1 capsule by mouth daily.  [provider] Taking Active Self  roflumilast (DALIRESP) 500 MCG TABS tablet [id number] Yes Take 1 tablet (500 mcg total) by mouth daily. Martyn Ehrich, NP Taking Active   SYNTHROID 88 MCG tablet  [id number] Yes TAKE 1 TABLET(88 MCG) BY MOUTH DAILY McGowen, Adrian Blackwater, MD Taking Active   triamcinolone cream (KENALOG) 0.1 % [id number] Yes Apply 1 application topically 2 (two) times daily. Raoul Pitch, Renee A, DO Taking Active   venlafaxine (EFFEXOR) 50 MG tablet [id number] Yes TAKE 1 TABLET BY MOUTH TWICE DAILY McGowen, Adrian Blackwater, MD Taking Active   Vibegron (GEMTESA) 75 MG TABS [id number] No Take 1 capsule by mouth daily.  Patient not taking: Reported on 12/07/2020   Cleon Gustin, MD Not Taking Active   VISINE 0.05 % ophthalmic solution [id number] Yes 1 drop  as needed [provider] Taking Active   vitamin B-12 (CYANOCOBALAMIN) 100 MCG tablet [id number] Yes Take 100 mcg by mouth daily. [provider] Taking Active   VITAMIN D PO [id number] Yes Take by mouth. [provider] Taking Active              SDOH:  (Social Determinants of Health) assessments and interventions performed:  SDOH Interventions    Flowsheet Row Most Recent Value  SDOH Interventions   Food Insecurity Interventions Intervention Not Indicated  Housing Interventions Intervention Not Indicated  Transportation Interventions Intervention Not Indicated       Care Plan  Review of patient past medical history, allergies, medications, health status, including review of consultants reports, laboratory and other test data, was performed as part of comprehensive evaluation for care management services.   Care Plan : RN Care Manager Plan of Care  Updates made by Johnny Latu, Eppie Gibson, RN since 03/06/2021 12:00 AM     Problem: [place] Deficit related to COPD and Care Coordination Needs   Priority: High     Long-Range Goal: Development Plan of Care for Management of Diabetes   Start Date: 12/21/2020  Expected End Date: 02/01/2022  Priority: High  Note:   Current Barriers:  Knowledge Deficits related to plan of care for management of HTN and COPD   RNCM Clinical Goal(s):  Patient will  verbalize understanding of plan for management of HTN and COPD as evidenced by continuation of monitoring blood pressure and triggers for COPD exacerbation  through collaboration with RN Care manager, provider, and care team.   Interventions: Inter-disciplinary care team collaboration (see longitudinal plan of care) Evaluation of current treatment plan related to  self management and patient's adherence to plan as established by provider  Patient Goals/Self-Care Activities: Take medications as prescribed   Attend all scheduled provider appointments Call pharmacy for medication refills 3-7 days in advance of running out of medications Attend church or other social activities Perform all self care activities independently  Perform IADL's (shopping, preparing meals, housekeeping, managing finances) independently Call provider office for new concerns or questions  call the Suicide and Crisis Lifeline: 988 if experiencing a Mental Health or [place]  - avoid second hand smoke - identify and avoid work-related triggers - identify and remove indoor air pollutants - limit outdoor activity during cold weather - listen for public air quality announcements every day - develop a rescue plan - eliminate symptom triggers at  home - follow rescue plan if symptoms flare-up - keep follow-up appointments: PCP and eye Dr - eat healthy/prescribed diet: Low sodium diet check blood pressure weekly choose a place to take my blood pressure (home, clinic or office, retail store) write blood pressure results in a log or diary learn about high blood pressure take blood pressure log to all doctor appointments call doctor for signs and symptoms of high blood pressure keep all doctor appointments report new symptoms to your doctor limit salt intake to 2,300 mg/day        Plan: Telephone follow up appointment with care management team member scheduled for:  May 2023 The patient has been  provided with contact information for the care management team and has been advised to call with any health related questions or concerns.    [place] [facility] [phone]

## 2021-03-06 NOTE — Progress Notes (Signed)
OFFICE VISIT  03/06/2021  CC:  Chief Complaint  Patient presents with   Decreased hearing    Pt states R ear is worse than left and feels like possible clog. She has used peroxide 2 weeks ago.   HPI:    Patient is a 86 y.o. female who presents for R ear decreased hearing.  HPI: Acute signif loss of hearing in right ear upon waking up yesterday. No pain in the ear.  No dizziness. She is quite worried.  She put peroxide in it and this did not help. The only thing she notes different is that she started sleeping on her right side prior to the onset of this. No significant nasal congestion or sinus pressure.  Past Medical History:  Diagnosis Date   Allergic rhinitis    with upper airway cough: as of 07/2017 pulm f/u she was instructed to use astelin, flonase, and saline more consistently   BPPV (benign paroxysmal positional vertigo) 03/2018   Chest pain, non-cardiac    Cardiac CT showed no signif obstructive dz, mildly elevated calcium level (Dr. [name]).   Chronic cystitis with hematuria    [name]   Chronic hypoxemic respiratory failure ([place]) 02/02/2015   2L oxygen 24/7   Chronic renal insufficiency, stage 2 (mild)    GFR approx 60   Cold hands    NCS/EMGs normal.  Hand dysesthesias per neuro--reassured   COPD (chronic obstructive pulmonary disease) ([place])    GOLD 4.  spirometry 01/10/09 FEV 0.97(52%), FEV1% 47.  With chronic bronchitis as of 07/2017.   DDD (degenerative disc disease), cervical    1998 MRI C5-6 impingemt (left).  MRI 2021 (neurol) 1 level canal stenosis, 1 level foraminal stenosis--->PT   DDD (degenerative disc disease), lumbar    Depression with anxiety 04/15/2011   Diarrheal disease Summer 2017   ? 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).  As of 10/05/15 pt reports GI dx'd her with C diff and rx'd more flagyl and she is also on cholestyramine.   As of 02/2016, pt's sx's resolved completely.   Diverticulosis of colon    Procto '97 and colonoscopy 2002   Dysplastic nevus of upper extremity 04/2014   R tricep ([name])   Edema of both lower extremities due to peripheral venous insufficiency    Varicosities bilat, swelling L>R.  R baker's cyst.  Got vein clinic eval 02/2018->sclerotherapy recommended but since no hemorrhage or ulceration, insurance will not cover the procedure.   Family history of adverse reaction to anesthesia    mother died when patient age 89 receiving anesthesia for thyroid surgery   Fibrocystic breast disease    w/fibroadenoma.  Bx's showed NO atypia (Dr. Margot Chimes).  Mammo neg 2008.   GERD (gastroesophageal reflux disease)    History of double vision    Ophthalmologist, Dr. Herbert Deaner, is further evaluating this with MRI  orbits and limited brain (myesthenia gravis testing neg 07/2010)   History of home oxygen therapy    uses 2 liters at hs   Hypertension    EKG 03/2010 normal   Hypothyroidism    Hashimoto's, dx'd 1989   Idiopathic peripheral neuropathy 04/2018   Sensory (numb feet) S1 distribution bilat, c/w spinal stenosis sensory neuropathy (no pain). 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  [facility] [place], MRI C spine->some spondylosis/stenosis.  UE NCS/EMS: normal 04/2019.   Lesion of right native kidney 2017  found on CT 02/2015.  F/u MRI 03/31/15 showed it to be smaller and less likely of any significance but repeat CT renal protocol in 30mo was recommended by radiology.  This was done 10/26/15 and showed no interval change in the lesion, but b/c the lesion enhances with contrast, radiol rec'd urol consult (b/c pap renal RCC could not be excluded).  Saw urol 03/06/16--stable on u/s 09/2016 and 09/2017.   OAB (overactive bladder)    Osteoarthritis, hip, bilateral    she is s/p bilat THA as of 02/2018.   Osteoporosis    radius, T score -2.5->rec'd alendronate  08/2019->plan rpt DEXA 08/2021.   Pneumonia    Postmenopausal atrophic vaginitis    improved with estrace 2 X/week.   Prolapse of vaginal cuff after hysterectomy    10/2018: Dr. Wayne Both. Mild colpocele 04/2019 per GYN (no surgery)   Pulmonary nodule    CT chest 12/10, June 2011.  No nodule on CT 06/2010.   Recurrent Clostridium difficile diarrhea    Episode 09/19/2016: resolved with prolonged course of flagyl.  11/2016 recurrence treated with 10d of Dificid (Fidaxomicin) by GI (Dr. Earlean Shawl).   Recurrent UTI    likely related to pt's atrophic vaginitis.  Urol started vaginal estrace 03/2016.   Rib fracture 09/2018   R 7th, laterally-->sustained when her dog pulled her over and she fell.   RLS (restless legs syndrome)    neuro->gabapentin trial 2020/21   Shingles 02/2014   R abd/flank   Spinal stenosis of lumbar region with neurogenic claudication    2008 MRI (L3-4, L4-5, L5-S1).  +S1 distribution sensory loss bilat    Past Surgical History:  Procedure Laterality Date   ABDOMINAL HYSTERECTOMY  1978   no BSO per pt--nonmalignant reasons   APPENDECTOMY     BREAST CYST EXCISION     Last screening mammogram 10/2010 was normal.   CHOLECYSTECTOMY  2001   COLONOSCOPY  04/2005; 12/06/2015   2007 NORMAL.  2017 adenomatous polyp x 1.  Mod sigmoid diverticulosis (Dr. Earlean Shawl).     DEXA  05/18/2017; 08/7019   2019 NORMAL (T-score 0.1).  08/2019->T score -2.5->alendronate started->plan Rpt 08/2021.   HIP CLOSED REDUCTION Right 12/25/2015   Procedure: CLOSED REDUCTION HIP;  Surgeon: Nicholes Stairs, MD;  Location: [facility];  Service: Orthopedics;  Laterality: Right;   NCV/EMS  10/2018   chronic and symmetric sensorimotor axonal polyneuropathy affecting the lower extremities   TONSILLECTOMY     TOTAL HIP ARTHROPLASTY Right 11/01/2014   Procedure: RIGHT TOTAL HIP ARTHROPLASTY;  Surgeon: Latanya Maudlin, MD;  Location: [facility];  Service: Orthopedics;  Laterality: Right;   TOTAL HIP ARTHROPLASTY Left 12/08/2017    Procedure: LEFT TOTAL HIP ARTHROPLASTY ANTERIOR APPROACH;  Surgeon: Paralee Cancel, MD;  Location: [facility];  Service: Orthopedics;  Laterality: Left;  70 mins   TRANSTHORACIC ECHOCARDIOGRAM  08/2014   EF 60-65%, grade I diast dysfxn   TUBAL LIGATION  1974    Outpatient Medications Prior to Visit  Medication Sig Dispense Refill   albuterol (PROAIR HFA) 108 (90 Base) MCG/ACT inhaler Inhale 2 puffs into the lungs every 4 (four) hours as needed for wheezing or shortness of breath. 8.5 g 1   alendronate (FOSAMAX) 70 MG tablet TAKE 1 TABLET(70 MG) BY MOUTH 1 TIME A WEEK WITH A FULL GLASS OF WATER AND ON AN EMPTY STOMACH 12 tablet 3   amLODipine (NORVASC) 10 MG tablet Take 1 tablet (10 mg total) by mouth daily. 30 tablet 1   Artificial  Tear Solution (SOOTHE XP) SOLN Place 1 drop into both eyes 2 (two) times daily as needed (irritation).     aspirin EC 81 MG tablet Take 81 mg by mouth daily.     azelastine (ASTELIN) 0.1 % nasal spray Place 2 sprays into both nostrils 2 (two) times daily. Use in each nostril as directed (Patient not taking: Reported on 12/07/2020) 30 mL 3   cetirizine (ZYRTEC) 10 MG tablet Take 1 tablet (10 mg total) by mouth daily. (Patient not taking: Reported on 12/07/2020) 30 tablet 6   clobetasol (TEMOVATE) 0.05 % external solution APPLY TO THE AFFECTED AREA EVERY DAY 50 mL 4   diazepam (VALIUM) 5 MG tablet TAKE 1 TABLET BY MOUTH DAILY AS NEEDED FOR ANXIETY AND INSOMNIA 30 tablet 5   fluorometholone (FML) 0.1 % ophthalmic suspension Place 1 drop into both eyes as directed. Tapering off     fluticasone (FLONASE) 50 MCG/ACT nasal spray Place 2 sprays into both nostrils daily. (Patient not taking: Reported on 12/07/2020) 16 g 6   Fluticasone-Umeclidin-Vilant (TRELEGY ELLIPTA) 100-62.5-25 MCG/INH AEPB Inhale 1 puff into the lungs daily. 60 each 6   Fluticasone-Umeclidin-Vilant (TRELEGY ELLIPTA) 100-62.5-25 MCG/INH AEPB Inhale 1 puff into the lungs daily. 14 each 0   furosemide (LASIX) 20  MG tablet TAKE 1 TABLET BY MOUTH EVERY DAY AS NEEDED FOR INCREASED FLUID COLLECTION IN LEGS 30 tablet 3   nitrofurantoin (MACRODANTIN) 50 MG capsule Take 1 capsule (50 mg total) by mouth at bedtime. 90 capsule 3   Probiotic Product (PROBIOTIC DAILY PO) Take 1 capsule by mouth daily.      roflumilast (DALIRESP) 500 MCG TABS tablet Take 1 tablet (500 mcg total) by mouth daily. 90 tablet 3   SYNTHROID 88 MCG tablet TAKE 1 TABLET(88 MCG) BY MOUTH DAILY 30 tablet 1   triamcinolone cream (KENALOG) 0.1 % Apply 1 application topically 2 (two) times daily. 45 g 2   venlafaxine (EFFEXOR) 50 MG tablet Take 1 tablet (50 mg total) by mouth 2 (two) times daily. 180 tablet 3   Vibegron (GEMTESA) 75 MG TABS Take 1 capsule by mouth daily. (Patient not taking: Reported on 12/07/2020) 30 tablet 11   VISINE 0.05 % ophthalmic solution 1 drop  as needed     vitamin B-12 (CYANOCOBALAMIN) 100 MCG tablet Take 100 mcg by mouth daily.     VITAMIN D PO Take by mouth.     No facility-administered medications prior to visit.    Allergies  Allergen Reactions   Losartan Other (See Comments)    hyperkalemia   Penicillins Hives, Swelling and Rash    Has patient had a PCN reaction causing immediate rash, facial/tongue/throat swelling, SOB or lightheadedness with hypotension: YES Has patient had a PCN reaction causing severe rash involving mucus membranes or skin necrosis: NO Has patient had a PCN reaction that required hospitalization NO Has patient had a PCN reaction occurring within the last 10 years: NO If all of the above answers are "NO", then may proceed with Cephalosporin use.    Cefixime [Kdc:Cefixime] Other (See Comments)    unspecified   Gabapentin Other (See Comments)    Feels woozy   Indocin [Indomethacin] Other (See Comments)    Painful tongue and throat   Singulair [Montelukast Sodium]     Chest tightness, decreased mental clarity   Sulfa Antibiotics Other (See Comments)    unspecified   Ace Inhibitors  Other (See Comments)    cough   Statins Other (See Comments)  Myalgias     ROS As per HPI  PE: Vitals with BMI 03/06/2021 02/20/2021 01/18/2021  Height 5\' 0"  5\' 0"  -  Weight 129 lbs 126 lbs 13 oz -  BMI 95.09 32.67 -  Systolic 124 98 580  Diastolic 92 64 80  Pulse 60 64 -  Some encounter information is confidential and restricted. Go to Review Flowsheets activity to see all data.     Physical Exam  Gen: Alert, well appearing.  Patient is oriented to person, place, time, and situation. AFFECT: pleasant, lucid thought and speech. L EAC clear, normal epithelium, TM normal. R EAC with large cerumen ball abutting TM--cannot visualize TM. Minimal irritation of EAC epithelium. Weber: lateralizes to the right ear ("bad ear")  LABS:  Last CBC Lab Results  Component Value Date   WBC 8.0 02/20/2021   HGB 13.8 02/20/2021   HCT 41.9 02/20/2021   MCV 89.4 02/20/2021   MCH 28.4 12/10/2017   RDW 14.3 02/20/2021   PLT 268.0 99/83/3825   Last metabolic panel Lab Results  Component Value Date   GLUCOSE 81 03/01/2021   NA 140 03/01/2021   K 4.1 03/01/2021   CL 104 03/01/2021   CO2 27 03/01/2021   BUN 23 03/01/2021   CREATININE 1.00 03/01/2021   GFRNONAA >60 12/10/2017   CALCIUM 9.5 03/01/2021   PHOS 3.8 01/31/2013   PROT 6.3 12/07/2020   ALBUMIN 4.5 05/09/2020   BILITOT 0.5 12/07/2020   ALKPHOS 105 05/09/2020   AST 14 12/07/2020   ALT 10 12/07/2020   ANIONGAP 6 12/10/2017   Last thyroid functions Lab Results  Component Value Date   TSH 2.57 12/07/2020   T3TOTAL 97.2 07/11/2010   T4TOTAL 12.4 07/11/2010   IMPRESSION AND PLAN:  Acute right ear hearing impairment. Cerumen impaction abutting TM is noted. Consent obtained. Procedure: Cerumen Disimpaction  Warm water was applied and gentle ear lavage performed R ear for 5 minutes.  No cerumen came out.  Repeated this for 5 minutes.  Still no wax.  Auditory canal appeared mildly irritated after the procedure.  Still  unable to view TM. I attempted extraction with curette but was unable to safely reach the cerumen so I stopped.   Will refer to ENT.  Unknown when we might get appointment with them so we will have her back tomorrow at 130 to try again.  An After Visit Summary was printed and given to the patient.  FOLLOW UP: Return if symptoms worsen or fail to improve.  Signed:  Crissie Sickles, MD           03/06/2021

## 2021-03-06 NOTE — Patient Instructions (Signed)
Visit Information  Thank you for taking time to visit with me today. Please don't hesitate to contact me if I can be of assistance to you before our next scheduled telephone appointment.  Following are the goals we discussed today:  Current Barriers:  Knowledge Deficits related to plan of care for management of HTN and COPD   RNCM Clinical Goal(s):  Patient will verbalize understanding of plan for management of HTN and COPD as evidenced by continuation of monitoring blood pressure and triggers for COPD exacerbation through collaboration with RN Care manager, provider, and care team.   Interventions: Inter-disciplinary care team collaboration (see longitudinal plan of care) Evaluation of current treatment plan related to  self management and patient's adherence to plan as established by provider  Patient Goals/Self-Care Activities: Take medications as prescribed   Attend all scheduled provider appointments Call pharmacy for medication refills 3-7 days in advance of running out of medications Attend church or other social activities Perform all self care activities independently  Perform IADL's (shopping, preparing meals, housekeeping, managing finances) independently Call provider office for new concerns or questions  call the Suicide and Crisis Lifeline: 988 if experiencing a Mental Health or [place]  - avoid second hand smoke - identify and avoid work-related triggers - identify and remove indoor air pollutants - limit outdoor activity during cold weather - listen for public air quality announcements every day - develop a rescue plan - eliminate symptom triggers at home - follow rescue plan if symptoms flare-up - keep follow-up appointments: PCP and eye Dr - eat healthy/prescribed diet: Low sodium diet check blood pressure weekly choose a place to take my blood pressure (home, clinic or office, retail store) write blood pressure results in a log or diary learn  about high blood pressure take blood pressure log to all doctor appointments call doctor for signs and symptoms of high blood pressure keep all doctor appointments report new symptoms to your doctor limit salt intake to 2,300 mg/day    Our next appointment is by telephone on Jun 04, 2021   Please call Johny Shock RN at [phone] if you need to cancel or reschedule your appointment.   Please call the Suicide and Crisis Lifeline: 988 if you are experiencing a Mental Health or [place] or need someone to talk to.  The patient verbalized understanding of instructions, educational materials, and care plan provided today and agreed to receive a mailed copy of patient instructions, educational materials, and care plan.   Telephone follow up appointment with care management team member scheduled for: The patient has been provided with contact information for the care management team and has been advised to call with any health related questions or concerns.   [place] [facility] [phone]

## 2021-03-07 ENCOUNTER — Encounter: Payer: Self-pay | Admitting: Family Medicine

## 2021-03-07 ENCOUNTER — Ambulatory Visit (INDEPENDENT_AMBULATORY_CARE_PROVIDER_SITE_OTHER): Payer: Medicare Other | Admitting: Family Medicine

## 2021-03-07 VITALS — BP 120/72 | HR 67 | Temp 97.5°F | Ht 60.0 in | Wt 131.8 lb

## 2021-03-07 DIAGNOSIS — H6121 Impacted cerumen, right ear: Secondary | ICD-10-CM

## 2021-03-07 NOTE — Progress Notes (Signed)
OFFICE NOTE  03/07/2021  CC:  Chief Complaint  Patient presents with   Cerumen Impaction   HPI:   Patient is a 86 y.o. Caucasian female who is here for recheck of right ear decreased hearing with significant cerumen impaction. We attempted to get this irrigated and extracted yesterday here but were unsuccessful.   Ordered referral to ENT.  Interim history: Still dec hearing R ear but thinks it may be a little better. No pain. She has not been contacted to arrange ENT visit yet.  MEDS;   Outpatient Medications Prior to Visit  Medication Sig Dispense Refill   albuterol (PROAIR HFA) 108 (90 Base) MCG/ACT inhaler Inhale 2 puffs into the lungs every 4 (four) hours as needed for wheezing or shortness of breath. 8.5 g 1   alendronate (FOSAMAX) 70 MG tablet TAKE 1 TABLET(70 MG) BY MOUTH 1 TIME A WEEK WITH A FULL GLASS OF WATER AND ON AN EMPTY STOMACH 12 tablet 3   amLODipine (NORVASC) 10 MG tablet Take 1 tablet (10 mg total) by mouth daily. 30 tablet 1   Artificial Tear Solution (SOOTHE XP) SOLN Place 1 drop into both eyes 2 (two) times daily as needed (irritation).     aspirin EC 81 MG tablet Take 81 mg by mouth daily.     clobetasol (TEMOVATE) 0.05 % external solution APPLY TO THE AFFECTED AREA EVERY DAY 50 mL 4   diazepam (VALIUM) 5 MG tablet TAKE 1 TABLET BY MOUTH DAILY AS NEEDED FOR ANXIETY AND INSOMNIA 30 tablet 5   fluorometholone (FML) 0.1 % ophthalmic suspension Place 1 drop into both eyes as directed. Tapering off     Fluticasone-Umeclidin-Vilant (TRELEGY ELLIPTA) 100-62.5-25 MCG/INH AEPB Inhale 1 puff into the lungs daily. 60 each 6   Fluticasone-Umeclidin-Vilant (TRELEGY ELLIPTA) 100-62.5-25 MCG/INH AEPB Inhale 1 puff into the lungs daily. 14 each 0   furosemide (LASIX) 20 MG tablet TAKE 1 TABLET BY MOUTH EVERY DAY AS NEEDED FOR INCREASED FLUID COLLECTION IN LEGS 30 tablet 3   nitrofurantoin (MACRODANTIN) 50 MG capsule Take 1 capsule (50 mg total) by mouth at bedtime. 90 capsule 3    Probiotic Product (PROBIOTIC DAILY PO) Take 1 capsule by mouth daily.      roflumilast (DALIRESP) 500 MCG TABS tablet Take 1 tablet (500 mcg total) by mouth daily. 90 tablet 3   SYNTHROID 88 MCG tablet TAKE 1 TABLET(88 MCG) BY MOUTH DAILY 30 tablet 1   triamcinolone cream (KENALOG) 0.1 % Apply 1 application topically 2 (two) times daily. 45 g 2   venlafaxine (EFFEXOR) 50 MG tablet Take 1 tablet (50 mg total) by mouth 2 (two) times daily. 180 tablet 3   VISINE 0.05 % ophthalmic solution 1 drop  as needed     vitamin B-12 (CYANOCOBALAMIN) 100 MCG tablet Take 100 mcg by mouth daily.     VITAMIN D PO Take by mouth.     azelastine (ASTELIN) 0.1 % nasal spray Place 2 sprays into both nostrils 2 (two) times daily. Use in each nostril as directed (Patient not taking: Reported on 12/07/2020) 30 mL 3   cetirizine (ZYRTEC) 10 MG tablet Take 1 tablet (10 mg total) by mouth daily. (Patient not taking: Reported on 12/07/2020) 30 tablet 6   fluticasone (FLONASE) 50 MCG/ACT nasal spray Place 2 sprays into both nostrils daily. (Patient not taking: Reported on 12/07/2020) 16 g 6   Vibegron (GEMTESA) 75 MG TABS Take 1 capsule by mouth daily. (Patient not taking: Reported on 12/07/2020) 30 tablet  11   amLODipine (NORVASC) 5 MG tablet Take 5 mg by mouth daily. (Patient not taking: Reported on 03/07/2021)     No facility-administered medications prior to visit.    PE: Vitals with BMI 03/07/2021 03/06/2021 02/20/2021  Height 5\' 0"  5\' 0"  5\' 0"   Weight 131 lbs 13 oz 129 lbs 126 lbs 13 oz  BMI 25.74 59.97 74.14  Systolic 239 532 98  Diastolic 72 92 64  Pulse 67 60 64  Some encounter information is confidential and restricted. Go to Review Flowsheets activity to see all data.    R EAC with moderate size cerumen impaction deep in canal preventing any visualization of TM.  IMPRESSION AND PLAN:  R ear dec hearing, cerumen impaction abutting TM. Consent obtained. Procedure: Cerumen Disimpaction  Warm water was applied  and gentle ear lavage performed on right.  Large amount of cerumen came out today.  EAC with minimal irritation present, completely empty of cerumen after procedure, TM intact. The patient reported relief of symptoms after removal of cerumen   Will cancel ENT referral order.  After Visit Summary was printed and given to the patient.  FOLLOW UP:  Return for as needed.  Signed:  Crissie Sickles, MD           03/07/2021

## 2021-03-11 ENCOUNTER — Ambulatory Visit: Payer: Medicare Other | Admitting: Dermatology

## 2021-03-11 ENCOUNTER — Ambulatory Visit: Payer: Medicare Other | Admitting: Family Medicine

## 2021-03-15 ENCOUNTER — Other Ambulatory Visit: Payer: Self-pay

## 2021-03-15 MED ORDER — AMLODIPINE BESYLATE 10 MG PO TABS: 10.0000 mg | ORAL_TABLET | Freq: Every day | ORAL | 1 refills | Status: DC

## 2021-03-26 ENCOUNTER — Telehealth: Payer: Self-pay | Admitting: Pulmonary Disease

## 2021-03-26 MED ORDER — AZITHROMYCIN 250 MG PO TABS: ORAL_TABLET | ORAL | 0 refills | Status: AC

## 2021-03-26 NOTE — Telephone Encounter (Signed)
ATC patient. No answer and no VM avail.

## 2021-03-26 NOTE — Telephone Encounter (Signed)
Called and spoke to patient let her know ZPAK was sent in and gave her Dr. Collie Siad recs. She voiced understanding. Nothing further needed at this time.

## 2021-03-26 NOTE — Telephone Encounter (Signed)
Primary Pulmonologist: [name]  Last office visit and with whom: 10/01/2020 [name]  What do we see them for (pulmonary problems): COPD with chronic bronchitis and emphysema Last OV assessment/plan: see below  Was appointment offered to patient (explain)?  No none available In [facility] office    Reason for call: Patient states she has a runny nose, and coughing green mucus, feels feverish but unsure if she has a fever.  No covid test No Flu test   Is not having to use more oxygen than usual. Has not taken any OTC meds.  Patient is concerned about getting bronchitis.    Allergies  Allergen Reactions   Losartan Other (See Comments)    hyperkalemia   Penicillins Hives, Swelling and Rash    Has patient had a PCN reaction causing immediate rash, facial/tongue/throat swelling, SOB or lightheadedness with hypotension: YES Has patient had a PCN reaction causing severe rash involving mucus membranes or skin necrosis: NO Has patient had a PCN reaction that required hospitalization NO Has patient had a PCN reaction occurring within the last 10 years: NO If all of the above answers are "NO", then may proceed with Cephalosporin use.    Cefixime [Kdc:Cefixime] Other (See Comments)    unspecified   Gabapentin Other (See Comments)    Feels woozy   Indocin [Indomethacin] Other (See Comments)    Painful tongue and throat   Singulair [Montelukast Sodium]     Chest tightness, decreased mental clarity   Sulfa Antibiotics Other (See Comments)    unspecified   Ace Inhibitors Other (See Comments)    cough   Statins Other (See Comments)    Myalgias     Immunization History  Administered Date(s) Administered   Fluad Quad(high Dose 65+) 10/05/2018, 10/25/2019, 12/07/2020   Influenza Split 11/04/2010, 10/23/2011   Influenza Whole 10/04/2009   Influenza, High Dose Seasonal PF 11/07/2016, 11/04/2017   Influenza,inj,Quad PF,6+ Mos 10/07/2012, 10/11/2014   Influenza-Unspecified 11/07/2013, 11/01/2015    Moderna Sars-Covid-2 Vaccination 02/14/2019, 03/15/2019, 12/15/2019   PNEUMOCOCCAL CONJUGATE-20 07/17/2020   Pfizer Covid-19 Vaccine Bivalent Booster 42yrs & up 12/19/2020   Pneumococcal Conjugate-13 04/29/2013   Pneumococcal Polysaccharide-23 02/03/2005, 11/28/2010, 11/01/2018   Td 02/04/2008, 03/07/2010   Tdap 03/07/2020   Zoster Recombinat (Shingrix) 10/30/2016, 12/29/2016   Zoster, Live 02/10/2013     Assessment/Plan:    GOLD 4 COPD with chronic bronchitis. - continue trelegy and daliresp - prn albuterol - encouraged her to maintain her physical activity   Allergic rhinitis with upper airway cough. - could be related to dust mite exposure - discussed environmental control techniques - advised her to try using flonase and azelastine on regular basis for now - prn zyrtec   Chronic respiratory failure with hypoxia. - discussed role for supplemental oxygen - goal SpO2 > 90% - advised her to use supplemental oxygen on more regular basis during the day and continue 2 liters at night   Time Spent Involved in Patient Care on Day of Examination:  35 minutes   Follow up:    Patient Instructions  Try using flonase as needed for sinus congestion, runny nose and post nasal drip   Follow up in 6 months   Medication List:    Allergies as of 10/01/2020         Reactions    Losartan Other (See Comments)    hyperkalemia    Penicillins Hives, Swelling, Rash    Has patient had a PCN reaction causing immediate rash, facial/tongue/throat swelling, SOB or  lightheadedness with hypotension: YES Has patient had a PCN reaction causing severe rash involving mucus membranes or skin necrosis: NO Has patient had a PCN reaction that required hospitalization NO Has patient had a PCN reaction occurring within the last 10 years: NO If all of the above answers are "NO", then may proceed with Cephalosporin use.    Cefixime [kdc:cefixime] Other (See Comments)    unspecified    Gabapentin Other  (See Comments)    Feels woozy    Indocin [indomethacin] Other (See Comments)    Painful tongue and throat    Singulair [montelukast Sodium]      Chest tightness, decreased mental clarity    Sulfa Antibiotics Other (See Comments)    unspecified    Ace Inhibitors Other (See Comments)    cough    Statins Other (See Comments)    Myalgias            Medication List           Accurate as of October 01, 2020 12:59 PM. If you have any questions, ask your nurse or doctor.              albuterol 108 (90 Base) MCG/ACT inhaler Commonly known as: ProAir HFA Inhale 2 puffs into the lungs every 4 (four) hours as needed for wheezing or shortness of breath.    alendronate 70 MG tablet Commonly known as: FOSAMAX TAKE 1 TABLET(70 MG) BY MOUTH 1 TIME A WEEK WITH A FULL GLASS OF WATER AND ON AN EMPTY STOMACH    amLODipine 5 MG tablet Commonly known as: NORVASC TAKE 1 TABLET(5 MG) BY MOUTH DAILY    aspirin EC 81 MG tablet Take 81 mg by mouth daily.    azelastine 0.1 % nasal spray Commonly known as: ASTELIN Place 2 sprays into both nostrils 2 (two) times daily. Use in each nostril as directed    cetirizine 10 MG tablet Commonly known as: ZYRTEC Take 1 tablet (10 mg total) by mouth daily.    clobetasol 0.05 % external solution Commonly known as: TEMOVATE APPLY TO THE AFFECTED AREA EVERY DAY    diazepam 5 MG tablet Commonly known as: VALIUM TAKE 1 TABLET BY MOUTH DAILY AS NEEDED FOR ANXIETY AND INSOMNIA    fluorometholone 0.1 % ophthalmic suspension Commonly known as: FML Place 1 drop into both eyes as directed. Tapering off    fluticasone 50 MCG/ACT nasal spray Commonly known as: FLONASE Place 2 sprays into both nostrils daily.    furosemide 20 MG tablet Commonly known as: LASIX TAKE 1 TABLET BY MOUTH EVERY DAY AS NEEDED FOR INCREASED FLUID COLLECTION IN LEGS    mirabegron ER 25 MG Tb24 tablet Commonly known as: MYRBETRIQ Take 1 tablet (25 mg total) by mouth daily.     nitrofurantoin 50 MG capsule Commonly known as: MACRODANTIN Take 1 capsule (50 mg total) by mouth at bedtime.    PROBIOTIC DAILY PO Take 1 capsule by mouth daily.    roflumilast 500 MCG Tabs tablet Commonly known as: DALIRESP Take 1 tablet (500 mcg total) by mouth daily.    Soothe XP Soln Place 1 drop into both eyes 2 (two) times daily as needed (irritation).    Synthroid 88 MCG tablet Generic drug: levothyroxine TAKE 1 TABLET(88 MCG) BY MOUTH DAILY    Trelegy Ellipta 100-62.5-25 MCG/INH Aepb Generic drug: Fluticasone-Umeclidin-Vilant INHALE 1 PUFF INTO THE LUNGS DAILY    triamcinolone cream 0.1 % Commonly known as: KENALOG Apply 1 application topically 2 (two)  times daily.    venlafaxine 50 MG tablet Commonly known as: EFFEXOR TAKE 1 TABLET BY MOUTH TWICE DAILY    Visine 0.05 % ophthalmic solution Generic drug: tetrahydrozoline 1 drop  as needed    vitamin B-12 100 MCG tablet Commonly known as: CYANOCOBALAMIN Take 100 mcg by mouth daily.    VITAMIN D PO Take by mouth.

## 2021-03-26 NOTE — Telephone Encounter (Signed)
Can send script for zpak.  She should could to schedule ROV if not feeling better by the end of this week.

## 2021-03-26 NOTE — Telephone Encounter (Signed)
ATC patient back to let  her know [name] said to follow up for a ROV if she is not better by end of week.

## 2021-03-29 ENCOUNTER — Telehealth: Payer: Self-pay | Admitting: Pulmonary Disease

## 2021-03-29 ENCOUNTER — Telehealth: Payer: Self-pay | Admitting: Family Medicine

## 2021-03-29 NOTE — Telephone Encounter (Signed)
I can help her with that if she wants. If she prefers podiatrist then please refer to [facility], diagnosis toenail pain. Thanks.

## 2021-03-29 NOTE — Telephone Encounter (Signed)
FYI:  Pt called wanted to know is there a Podiatrist that she could see.  Pt said right big toe has been giving her problems. Toe nail   Pt cell: [phone]

## 2021-03-29 NOTE — Telephone Encounter (Signed)
Called and spoke to patient.  She states she is feeling better but not back to her baseline yet. Does have 2 more doses of zpak left. Offered her a [facility] office appt and she refused since she is feeling a little better. States if she gets any worse over the next few days she will call for an appt. Nothing further needed

## 2021-03-29 NOTE — Telephone Encounter (Signed)
Please review and advise.

## 2021-03-29 NOTE — Telephone Encounter (Signed)
Pt advised of recommendations. Pt scheduled for appt and will proceed from there.

## 2021-04-02 ENCOUNTER — Telehealth: Payer: Self-pay | Admitting: Pulmonary Disease

## 2021-04-02 MED ORDER — TRELEGY ELLIPTA 100-62.5-25 MCG/ACT IN AEPB: 1.0000 | INHALATION_SPRAY | Freq: Every day | RESPIRATORY_TRACT | 5 refills | Status: DC

## 2021-04-02 NOTE — Telephone Encounter (Signed)
Trelegy sent to [facility].  Patient is aware and voiced her understanding.  Nothing further needed.

## 2021-04-04 ENCOUNTER — Ambulatory Visit (INDEPENDENT_AMBULATORY_CARE_PROVIDER_SITE_OTHER): Payer: Medicare Other | Admitting: Family Medicine

## 2021-04-04 ENCOUNTER — Telehealth: Payer: Self-pay | Admitting: Pharmacy Technician

## 2021-04-04 ENCOUNTER — Encounter: Payer: Self-pay | Admitting: Family Medicine

## 2021-04-04 ENCOUNTER — Other Ambulatory Visit: Payer: Self-pay

## 2021-04-04 VITALS — BP 130/74 | HR 68 | Temp 97.9°F | Ht 60.0 in | Wt 131.2 lb

## 2021-04-04 DIAGNOSIS — L600 Ingrowing nail: Secondary | ICD-10-CM | POA: Diagnosis not present

## 2021-04-04 DIAGNOSIS — Z596 Low income: Secondary | ICD-10-CM

## 2021-04-04 NOTE — Progress Notes (Signed)
[facility])  ?                                          [facility] Quality Pharmacy Team ?  ? ?04/04/2021 ? ?Priscilla Cantrell ?[birth year] ?[id number] ? ?                                    Medication Assistance Referral ? ?Referral From: [facility] Colleen S. ? ?Medication/Company: Trelegy / [place] ?Patient application portion:  Mailed ?Provider application portion: Faxed  to Dr. Chesley Mires ?Provider address/fax verified via: Office website ? ? ?[name], CPhT ?[place]  ?([phone] ? ?

## 2021-04-04 NOTE — Progress Notes (Signed)
OFFICE VISIT  04/04/2021  CC:  Chief Complaint  Patient presents with   Toe Pain    R big toe, 4-5 months it has been bothering her. Still hurts to bend it and possible ingrown toenail     Patient is a 86 y.o. female who presents for toe pain.  HPI: Right great toe with some ingrowing of the nail relatively recently. It was pinkish and a little tender and swollen next to the nail and about a week ago she had a friend trim back the irritated portion and since then it has felt better.  She says a little bit amount of mucoid material did come out at that time  Past Medical History:  Diagnosis Date   Allergic rhinitis    with upper airway cough: as of 07/2017 pulm f/u she was instructed to use astelin, flonase, and saline more consistently   BPPV (benign paroxysmal positional vertigo) 03/2018   Chest pain, non-cardiac    Cardiac CT showed no signif obstructive dz, mildly elevated calcium level (Dr. [name]).   Chronic cystitis with hematuria    [name]   Chronic hypoxemic respiratory failure ([place]) 02/02/2015   2L oxygen 24/7   Chronic renal insufficiency, stage 2 (mild)    GFR approx 60   Cold hands    NCS/EMGs normal.  Hand dysesthesias per neuro--reassured   COPD (chronic obstructive pulmonary disease) ([place])    GOLD 4.  spirometry 01/10/09 FEV 0.97(52%), FEV1% 47.  With chronic bronchitis as of 07/2017.   DDD (degenerative disc disease), cervical    1998 MRI C5-6 impingemt (left).  MRI 2021 (neurol) 1 level canal stenosis, 1 level foraminal stenosis--->PT   DDD (degenerative disc disease), lumbar    Depression with anxiety 04/15/2011   Diarrheal disease Summer 2017   ? 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).  As of 10/05/15 pt reports GI dx'd her with C diff and rx'd more flagyl and she is also on cholestyramine.  As of 02/2016, pt's sx's resolved completely.    Diverticulosis of colon    Procto '97 and colonoscopy 2002   Dysplastic nevus of upper extremity 04/2014   R tricep ([name])   Edema of both lower extremities due to peripheral venous insufficiency    Varicosities bilat, swelling L>R.  R baker's cyst.  Got vein clinic eval 02/2018->sclerotherapy recommended but since no hemorrhage or ulceration, insurance will not cover the procedure.   Family history of adverse reaction to anesthesia    mother died when patient age 55 receiving anesthesia for thyroid surgery   Fibrocystic breast disease    w/fibroadenoma.  Bx's showed NO atypia (Dr. Margot Chimes).  Mammo neg 2008.   GERD (gastroesophageal reflux disease)    History of double vision    Ophthalmologist, Dr. Herbert Deaner, is further evaluating this with MRI  orbits and limited brain (myesthenia gravis testing neg 07/2010)   History of home oxygen therapy    uses 2 liters at hs   Hypertension    EKG 03/2010 normal   Hypothyroidism    Hashimoto's, dx'd 1989   Idiopathic peripheral neuropathy 04/2018   Sensory (numb feet) S1 distribution bilat, c/w spinal stenosis sensory neuropathy (no pain). 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  Labs [place], MRI C spine->some spondylosis/stenosis.  UE NCS/EMS: normal 04/2019.   Lesion of right native kidney 2017   found on CT 02/2015.  F/u  MRI 03/31/15 showed it to be smaller and less likely of any significance but repeat CT renal protocol in 49mo was recommended by radiology.  This was done 10/26/15 and showed no interval change in the lesion, but b/c the lesion enhances with contrast, radiol rec'd urol consult (b/c pap renal RCC could not be excluded).  Saw urol 03/06/16--stable on u/s 09/2016 and 09/2017.   OAB (overactive bladder)    Osteoarthritis, hip, bilateral    she is s/p bilat THA as of 02/2018.   Osteoporosis    radius, T score -2.5->rec'd alendronate 08/2019->plan rpt DEXA 08/2021.   Pneumonia     Postmenopausal atrophic vaginitis    improved with estrace 2 X/week.   Prolapse of vaginal cuff after hysterectomy    10/2018: Dr. Wayne Both. Mild colpocele 04/2019 per GYN (no surgery)   Pulmonary nodule    CT chest 12/10, June 2011.  No nodule on CT 06/2010.   Recurrent Clostridium difficile diarrhea    Episode 09/19/2016: resolved with prolonged course of flagyl.  11/2016 recurrence treated with 10d of Dificid (Fidaxomicin) by GI (Dr. Earlean Shawl).   Recurrent UTI    likely related to pt's atrophic vaginitis.  Urol started vaginal estrace 03/2016.   Rib fracture 09/2018   R 7th, laterally-->sustained when her dog pulled her over and she fell.   RLS (restless legs syndrome)    neuro->gabapentin trial 2020/21   Shingles 02/2014   R abd/flank   Spinal stenosis of lumbar region with neurogenic claudication    2008 MRI (L3-4, L4-5, L5-S1).  +S1 distribution sensory loss bilat    Past Surgical History:  Procedure Laterality Date   ABDOMINAL HYSTERECTOMY  1978   no BSO per pt--nonmalignant reasons   APPENDECTOMY     BREAST CYST EXCISION     Last screening mammogram 10/2010 was normal.   CHOLECYSTECTOMY  2001   COLONOSCOPY  04/2005; 12/06/2015   2007 NORMAL.  2017 adenomatous polyp x 1.  Mod sigmoid diverticulosis (Dr. Earlean Shawl).     DEXA  05/18/2017; 08/7019   2019 NORMAL (T-score 0.1).  08/2019->T score -2.5->alendronate started->plan Rpt 08/2021.   HIP CLOSED REDUCTION Right 12/25/2015   Procedure: CLOSED REDUCTION HIP;  Surgeon: Nicholes Stairs, MD;  Location: [facility];  Service: Orthopedics;  Laterality: Right;   NCV/EMS  10/2018   chronic and symmetric sensorimotor axonal polyneuropathy affecting the lower extremities   TONSILLECTOMY     TOTAL HIP ARTHROPLASTY Right 11/01/2014   Procedure: RIGHT TOTAL HIP ARTHROPLASTY;  Surgeon: Latanya Maudlin, MD;  Location: [facility];  Service: Orthopedics;  Laterality: Right;   TOTAL HIP ARTHROPLASTY Left 12/08/2017   Procedure: LEFT TOTAL HIP ARTHROPLASTY  ANTERIOR APPROACH;  Surgeon: Paralee Cancel, MD;  Location: [facility];  Service: Orthopedics;  Laterality: Left;  70 mins   TRANSTHORACIC ECHOCARDIOGRAM  08/2014   EF 60-65%, grade I diast dysfxn   TUBAL LIGATION  1974    Outpatient Medications Prior to Visit  Medication Sig Dispense Refill   albuterol (PROAIR HFA) 108 (90 Base) MCG/ACT inhaler Inhale 2 puffs into the lungs every 4 (four) hours as needed for wheezing or shortness of breath. 8.5 g 1   alendronate (FOSAMAX) 70 MG tablet TAKE 1 TABLET(70 MG) BY MOUTH 1 TIME A WEEK WITH A FULL GLASS OF WATER AND ON AN EMPTY STOMACH 12 tablet 3   amLODipine (NORVASC) 10 MG tablet Take 1 tablet (10 mg total) by mouth daily. 30 tablet 1   Artificial Tear Solution (SOOTHE XP) SOLN Place  1 drop into both eyes 2 (two) times daily as needed (irritation).     aspirin EC 81 MG tablet Take 81 mg by mouth daily.     azelastine (ASTELIN) 0.1 % nasal spray Place 2 sprays into both nostrils 2 (two) times daily. Use in each nostril as directed (Patient not taking: Reported on 12/07/2020) 30 mL 3   cetirizine (ZYRTEC) 10 MG tablet Take 1 tablet (10 mg total) by mouth daily. (Patient not taking: Reported on 12/07/2020) 30 tablet 6   clobetasol (TEMOVATE) 0.05 % external solution APPLY TO THE AFFECTED AREA EVERY DAY 50 mL 4   diazepam (VALIUM) 5 MG tablet TAKE 1 TABLET BY MOUTH DAILY AS NEEDED FOR ANXIETY AND INSOMNIA 30 tablet 5   fluorometholone (FML) 0.1 % ophthalmic suspension Place 1 drop into both eyes as directed. Tapering off     fluticasone (FLONASE) 50 MCG/ACT nasal spray Place 2 sprays into both nostrils daily. (Patient not taking: Reported on 12/07/2020) 16 g 6   Fluticasone-Umeclidin-Vilant (TRELEGY ELLIPTA) 100-62.5-25 MCG/ACT AEPB Inhale 1 puff into the lungs daily. 60 each 5   furosemide (LASIX) 20 MG tablet TAKE 1 TABLET BY MOUTH EVERY DAY AS NEEDED FOR INCREASED FLUID COLLECTION IN LEGS 30 tablet 3   nitrofurantoin (MACRODANTIN) 50 MG capsule Take 1 capsule  (50 mg total) by mouth at bedtime. 90 capsule 3   Probiotic Product (PROBIOTIC DAILY PO) Take 1 capsule by mouth daily.      roflumilast (DALIRESP) 500 MCG TABS tablet Take 1 tablet (500 mcg total) by mouth daily. 90 tablet 3   SYNTHROID 88 MCG tablet TAKE 1 TABLET(88 MCG) BY MOUTH DAILY 30 tablet 1   triamcinolone cream (KENALOG) 0.1 % Apply 1 application topically 2 (two) times daily. 45 g 2   venlafaxine (EFFEXOR) 50 MG tablet Take 1 tablet (50 mg total) by mouth 2 (two) times daily. 180 tablet 3   Vibegron (GEMTESA) 75 MG TABS Take 1 capsule by mouth daily. (Patient not taking: Reported on 12/07/2020) 30 tablet 11   VISINE 0.05 % ophthalmic solution 1 drop  as needed     vitamin B-12 (CYANOCOBALAMIN) 100 MCG tablet Take 100 mcg by mouth daily.     VITAMIN D PO Take by mouth.     No facility-administered medications prior to visit.    Allergies  Allergen Reactions   Losartan Other (See Comments)    hyperkalemia   Penicillins Hives, Swelling and Rash    Has patient had a PCN reaction causing immediate rash, facial/tongue/throat swelling, SOB or lightheadedness with hypotension: YES Has patient had a PCN reaction causing severe rash involving mucus membranes or skin necrosis: NO Has patient had a PCN reaction that required hospitalization NO Has patient had a PCN reaction occurring within the last 10 years: NO If all of the above answers are "NO", then may proceed with Cephalosporin use.    Cefixime [Kdc:Cefixime] Other (See Comments)    unspecified   Gabapentin Other (See Comments)    Feels woozy   Indocin [Indomethacin] Other (See Comments)    Painful tongue and throat   Singulair [Montelukast Sodium]     Chest tightness, decreased mental clarity   Sulfa Antibiotics Other (See Comments)    unspecified   Ace Inhibitors Other (See Comments)    cough   Statins Other (See Comments)    Myalgias     ROS As per HPI  PE: Vitals with BMI 04/04/2021 03/07/2021 03/06/2021  Height 5'  0" 5\' 0"   5\' 0"   Weight 131 lbs 3 oz 131 lbs 13 oz 129 lbs  BMI 25.62 03.15 94.58  Systolic 592 924 462  Diastolic 74 72 92  Pulse 68 67 60  Some encounter information is confidential and restricted. Go to Review Flowsheets activity to see all data.  02 sat 92% on RA today  Physical Exam  General: Alert and well-appearing. Right foot without edema or erythema or warmth. Right great toenail with the distal one quarter cut off.  Nailbed in this area appears normal.  The skin borders of the nail have no erythema, swelling, or tenderness.  No ingrowing noted at this time  LABS:  Last metabolic panel Lab Results  Component Value Date   GLUCOSE 81 03/01/2021   NA 140 03/01/2021   K 4.1 03/01/2021   CL 104 03/01/2021   CO2 27 03/01/2021   BUN 23 03/01/2021   CREATININE 1.00 03/01/2021   GFRNONAA >60 12/10/2017   CALCIUM 9.5 03/01/2021   PHOS 3.8 01/31/2013   PROT 6.3 12/07/2020   ALBUMIN 4.5 05/09/2020   BILITOT 0.5 12/07/2020   ALKPHOS 105 05/09/2020   AST 14 12/07/2020   ALT 10 12/07/2020   ANIONGAP 6 12/10/2017   IMPRESSION AND PLAN:  Right great toe ingrowing toenail. Seems to have resolved with a friend cutting the involved portion of the nail out recently. Very minimal inflammatory changes remain.  No sign of infection. Instructions: Soak your foot in warm Epsom salt bath for 15 to 20 minutes every day. As the toenail grows in length trim small amounts straight across--do not trim the corners in.  An After Visit Summary was printed and given to the patient.  FOLLOW UP: No follow-ups on file.  Signed:  Crissie Sickles, MD           04/04/2021

## 2021-04-04 NOTE — Patient Instructions (Signed)
Soak your foot in warm Epsom salt bath for 15 to 20 minutes every day. ?As the toenail grows in length trim small amounts straight across--do not trim the corners in. ?

## 2021-04-10 ENCOUNTER — Other Ambulatory Visit: Payer: Self-pay

## 2021-04-10 MED ORDER — TRELEGY ELLIPTA 100-62.5-25 MCG/ACT IN AEPB: 1.0000 | INHALATION_SPRAY | Freq: Every day | RESPIRATORY_TRACT | 3 refills | Status: DC

## 2021-04-11 ENCOUNTER — Other Ambulatory Visit: Payer: Self-pay | Admitting: Urology

## 2021-04-11 DIAGNOSIS — N3021 Other chronic cystitis with hematuria: Secondary | ICD-10-CM

## 2021-04-16 ENCOUNTER — Other Ambulatory Visit: Payer: Self-pay

## 2021-04-16 DIAGNOSIS — N3021 Other chronic cystitis with hematuria: Secondary | ICD-10-CM

## 2021-04-16 MED ORDER — NITROFURANTOIN MACROCRYSTAL 50 MG PO CAPS: 50.0000 mg | ORAL_CAPSULE | Freq: Every day | ORAL | 3 refills | Status: DC

## 2021-04-17 ENCOUNTER — Ambulatory Visit: Payer: Medicare Other | Admitting: Dermatology

## 2021-04-26 DIAGNOSIS — H02054 Trichiasis without entropian left upper eyelid: Secondary | ICD-10-CM | POA: Diagnosis not present

## 2021-04-26 DIAGNOSIS — H04123 Dry eye syndrome of bilateral lacrimal glands: Secondary | ICD-10-CM | POA: Diagnosis not present

## 2021-04-26 DIAGNOSIS — H532 Diplopia: Secondary | ICD-10-CM | POA: Diagnosis not present

## 2021-04-29 ENCOUNTER — Telehealth: Payer: Self-pay

## 2021-04-29 ENCOUNTER — Encounter: Payer: Self-pay | Admitting: Family Medicine

## 2021-04-29 ENCOUNTER — Ambulatory Visit (INDEPENDENT_AMBULATORY_CARE_PROVIDER_SITE_OTHER): Payer: Medicare Other | Admitting: Family Medicine

## 2021-04-29 ENCOUNTER — Other Ambulatory Visit: Payer: Self-pay

## 2021-04-29 VITALS — Ht 60.0 in | Wt 130.0 lb

## 2021-04-29 DIAGNOSIS — K921 Melena: Secondary | ICD-10-CM

## 2021-04-29 DIAGNOSIS — M7989 Other specified soft tissue disorders: Secondary | ICD-10-CM

## 2021-04-29 DIAGNOSIS — I872 Venous insufficiency (chronic) (peripheral): Secondary | ICD-10-CM | POA: Diagnosis not present

## 2021-04-29 DIAGNOSIS — M79662 Pain in left lower leg: Secondary | ICD-10-CM | POA: Diagnosis not present

## 2021-04-29 DIAGNOSIS — R600 Localized edema: Secondary | ICD-10-CM | POA: Diagnosis not present

## 2021-04-29 NOTE — Patient Instructions (Signed)
Take 2 furosemide (lasix) '20mg'$  tabs daily ?

## 2021-04-29 NOTE — Telephone Encounter (Signed)
Good afternoon ladies, I have a patient requiring STAT US venous doppler to rule out DVT for left leg pain and swelling. Imaging was ordered for [facility]. Provider would like this completed within the next 24 hours. ?

## 2021-04-29 NOTE — Progress Notes (Signed)
OFFICE VISIT ? ?04/29/2021 ? ?CC:  ?Chief Complaint  ?Patient presents with  ? Leg Swelling  ? ? ?Patient is a 86 y.o. female who presents for  ?#1 left greater than right leg swelling.   ?#2 Black stools. ? ?HPI: ?Onset of swelling and pain in left calf about 5 days ago.  Right lower leg swelling some as well but not as bad.  She does have venous insufficiency edema chronically but has been well controlled.  She did start taking Lasix 20 mg a day about 5 days ago.  No preceding change in diet or activity level.  No recent surgeries.  No history of DVT. ? ?Notes having black stools in the mornings for about the last week.  No abdominal pain, no bright red blood.  It is not uncommon for her stools to be loose and sometimes to even have complete fecal incontinence. ? ?She does not take anticoagulants. ? ?ROS as above, plus--> no fevers, no CP, no SOB, no wheezing, no cough, no dizziness, no HAs, no rashes.  No polyuria or polydipsia.  No myalgias or arthralgias.  No focal weakness, paresthesias, or tremors.  No acute vision or hearing abnormalities.  No dysuria or unusual/new urinary urgency or frequency.   ?No n/v/d or abd pain.  No palpitations.   ? ?Past Medical History:  ?Diagnosis Date  ? Allergic rhinitis   ? with upper airway cough: as of 07/2017 pulm f/u she was instructed to use astelin, flonase, and saline more consistently  ? BPPV (benign paroxysmal positional vertigo) 03/2018  ? Chest pain, non-cardiac   ? Cardiac CT showed no signif obstructive dz, mildly elevated calcium level (Dr. [name]).  ? Chronic cystitis with hematuria   ? [name]  ? Chronic hypoxemic respiratory failure ([place]) 02/02/2015  ? 2L oxygen 24/7  ? Chronic renal insufficiency, stage 2 (mild)   ? GFR approx 60  ? Cold hands   ? NCS/EMGs normal.  Hand dysesthesias per neuro--reassured  ? COPD (chronic obstructive pulmonary disease) ([place])   ? GOLD 4.  spirometry 01/10/09 FEV 0.97(52%), FEV1% 47.  With chronic bronchitis as of  07/2017.  ? DDD (degenerative disc disease), cervical   ? 1998 MRI C5-6 impingemt (left).  MRI 2021 (neurol) 1 level canal stenosis, 1 level foraminal stenosis--->PT  ? DDD (degenerative disc disease), lumbar   ? Depression with anxiety 04/15/2011  ? Diarrheal disease Summer 2017  ? ? 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).  As of 10/05/15 pt reports GI dx'd her with C diff and rx'd more flagyl and she is also on cholestyramine.  As of 02/2016, pt's sx's resolved completely.  ? Diverticulosis of colon   ? Procto '97 and colonoscopy 2002  ? Dysplastic nevus of upper extremity 04/2014  ? R tricep ([name])  ? Edema of both lower extremities due to peripheral venous insufficiency   ? Varicosities bilat, swelling L>R.  R baker's cyst.  Got vein clinic eval 02/2018->sclerotherapy recommended but since no hemorrhage or ulceration, insurance will not cover the procedure.  ? Family history of adverse reaction to anesthesia   ? mother died when patient age 50 receiving anesthesia for thyroid surgery  ? Fibrocystic breast disease   ? w/fibroadenoma.  Bx's showed NO atypia (Dr. Margot Chimes).  Mammo neg 2008.  ? GERD (gastroesophageal reflux disease)   ? History of double vision   ? Ophthalmologist, Dr. Herbert Deaner, is  further evaluating this with MRI  orbits and limited brain (myesthenia gravis testing neg 07/2010)  ? History of home oxygen therapy   ? uses 2 liters at hs  ? Hypertension   ? EKG 03/2010 normal  ? Hypothyroidism   ? Hashimoto's, dx'd 1989  ? Idiopathic peripheral neuropathy 04/2018  ? Sensory (numb feet) S1 distribution bilat, c/w spinal stenosis sensory neuropathy (no pain). 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  Labs [place], MRI C spine->some spondylosis/stenosis.  UE NCS/EMS: normal 04/2019.  ? Lesion of right native kidney 2017  ? found on CT 02/2015.   F/u MRI 03/31/15 showed it to be smaller and less likely of any significance but repeat CT renal protocol in 19mowas recommended by radiology.  This was done 10/26/15 and showed no interval change in the lesion, but b/c the lesion enhances with contrast, radiol rec'd urol consult (b/c pap renal RCC could not be excluded).  Saw urol 03/06/16--stable on u/s 09/2016 and 09/2017.  ? OAB (overactive bladder)   ? Osteoarthritis, hip, bilateral   ? she is s/p bilat THA as of 02/2018.  ? Osteoporosis   ? radius, T score -2.5->rec'd alendronate 08/2019->plan rpt DEXA 08/2021.  ? Pneumonia   ? Postmenopausal atrophic vaginitis   ? improved with estrace 2 X/week.  ? Prolapse of vaginal cuff after hysterectomy   ? 10/2018: Dr. FWayne Both Mild colpocele 04/2019 per GYN (no surgery)  ? Pulmonary nodule   ? CT chest 12/10, June 2011.  No nodule on CT 06/2010.  ? Recurrent Clostridium difficile diarrhea   ? Episode 09/19/2016: resolved with prolonged course of flagyl.  11/2016 recurrence treated with 10d of Dificid (Fidaxomicin) by GI (Dr. MEarlean Shawl.  ? Recurrent UTI   ? likely related to pt's atrophic vaginitis.  Urol started vaginal estrace 03/2016.  ? Rib fracture 09/2018  ? R 7th, laterally-->sustained when her dog pulled her over and she fell.  ? RLS (restless legs syndrome)   ? neuro->gabapentin trial 2020/21  ? Shingles 02/2014  ? R abd/flank  ? Spinal stenosis of lumbar region with neurogenic claudication   ? 2008 MRI (L3-4, L4-5, L5-S1).  +S1 distribution sensory loss bilat  ? ? ?Past Surgical History:  ?Procedure Laterality Date  ? ABDOMINAL HYSTERECTOMY  1978  ? no BSO per pt--nonmalignant reasons  ? APPENDECTOMY    ? BREAST CYST EXCISION    ? Last screening mammogram 10/2010 was normal.  ? CHOLECYSTECTOMY  2001  ? COLONOSCOPY  04/2005; 12/06/2015  ? 2007 NORMAL.  2017 adenomatous polyp x 1.  Mod sigmoid diverticulosis (Dr. MEarlean Shawl.    ? DEXA  05/18/2017; 08/7019  ? 2019 NORMAL (T-score 0.1).  08/2019->T score -2.5->alendronate started->plan  Rpt 08/2021.  ? HIP CLOSED REDUCTION Right 12/25/2015  ? Procedure: CLOSED REDUCTION HIP;  Surgeon: JNicholes Stairs MD;  Location: [facility];  Service: Orthopedics;  Laterality: Right;  ? NCV/EMS  10/2018  ? chronic and symmetric sensorimotor axonal polyneuropathy affecting the lower extremities  ? TONSILLECTOMY    ? TOTAL HIP ARTHROPLASTY Right 11/01/2014  ? Procedure: RIGHT TOTAL HIP ARTHROPLASTY;  Surgeon: RLatanya Maudlin MD;  Location: [facility];  Service: Orthopedics;  Laterality: Right;  ? TOTAL HIP ARTHROPLASTY Left 12/08/2017  ? Procedure: LEFT TOTAL HIP ARTHROPLASTY ANTERIOR APPROACH;  Surgeon: OParalee Cancel MD;  Location: [facility];  Service: Orthopedics;  Laterality: Left;  70 mins  ? TRANSTHORACIC ECHOCARDIOGRAM  08/2014  ? EF 60-65%, grade I diast dysfxn  ?  TUBAL LIGATION  1974  ? ? ?Outpatient Medications Prior to Visit  ?Medication Sig Dispense Refill  ? albuterol (PROAIR HFA) 108 (90 Base) MCG/ACT inhaler Inhale 2 puffs into the lungs every 4 (four) hours as needed for wheezing or shortness of breath. 8.5 g 1  ? alendronate (FOSAMAX) 70 MG tablet TAKE 1 TABLET(70 MG) BY MOUTH 1 TIME A WEEK WITH A FULL GLASS OF WATER AND ON AN EMPTY STOMACH 12 tablet 3  ? amLODipine (NORVASC) 10 MG tablet Take 1 tablet (10 mg total) by mouth daily. 30 tablet 1  ? Artificial Tear Solution (SOOTHE XP) SOLN Place 1 drop into both eyes 2 (two) times daily as needed (irritation).    ? aspirin EC 81 MG tablet Take 81 mg by mouth daily.    ? azelastine (ASTELIN) 0.1 % nasal spray Place 2 sprays into both nostrils 2 (two) times daily. Use in each nostril as directed 30 mL 3  ? cetirizine (ZYRTEC) 10 MG tablet Take 1 tablet (10 mg total) by mouth daily. 30 tablet 6  ? clobetasol (TEMOVATE) 0.05 % external solution APPLY TO THE AFFECTED AREA EVERY DAY 50 mL 4  ? cycloSPORINE (RESTASIS OP) Apply to eye.    ? diazepam (VALIUM) 5 MG tablet TAKE 1 TABLET BY MOUTH DAILY AS NEEDED FOR ANXIETY AND INSOMNIA 30 tablet 5  ? fluorometholone  (FML) 0.1 % ophthalmic suspension Place 1 drop into both eyes as directed. Tapering off    ? fluticasone (FLONASE) 50 MCG/ACT nasal spray Place 2 sprays into both nostrils daily. 16 g 6  ? Fluticasone-Umecl

## 2021-04-30 ENCOUNTER — Telehealth (HOSPITAL_BASED_OUTPATIENT_CLINIC_OR_DEPARTMENT_OTHER): Payer: Self-pay

## 2021-04-30 LAB — BASIC METABOLIC PANEL
BUN: 32 mg/dL — ABNORMAL HIGH (ref 6–23)
CO2: 24 mEq/L (ref 19–32)
Calcium: 9.4 mg/dL (ref 8.4–10.5)
Chloride: 107 mEq/L (ref 96–112)
Creatinine, Ser: 1.28 mg/dL — ABNORMAL HIGH (ref 0.40–1.20)
GFR: 38.61 mL/min — ABNORMAL LOW (ref >60.00)
Glucose, Bld: 104 mg/dL — ABNORMAL HIGH (ref 70–99)
Potassium: 3.5 mEq/L (ref 3.5–5.1)
Sodium: 142 mEq/L (ref 135–145)

## 2021-04-30 LAB — CBC
HCT: 39.9 % (ref 36.0–46.0)
Hemoglobin: 13.1 g/dL (ref 12.0–15.0)
MCHC: 32.8 g/dL (ref 30.0–36.0)
MCV: 91 fl (ref 78.0–100.0)
Platelets: 251 10*3/uL (ref 150.0–400.0)
RBC: 4.38 Mil/uL (ref 3.87–5.11)
RDW: 14.8 % (ref 11.5–15.5)
WBC: 7.6 10*3/uL (ref 4.0–10.5)

## 2021-04-30 LAB — PROTIME-INR
INR: 0.9 ratio (ref 0.8–1.0)
Prothrombin Time: 10.2 s (ref 9.6–13.1)

## 2021-04-30 LAB — D-DIMER, QUANTITATIVE: D-Dimer, Quant: 0.43 mcg/mL FEU (ref <0.50)

## 2021-04-30 NOTE — Telephone Encounter (Signed)
Pt was reached and imaging number given to return call to be scheduled.  ? ?Thank you for your assistance ?

## 2021-05-01 ENCOUNTER — Telehealth: Payer: Self-pay | Admitting: Family Medicine

## 2021-05-01 ENCOUNTER — Ambulatory Visit (HOSPITAL_BASED_OUTPATIENT_CLINIC_OR_DEPARTMENT_OTHER)
Admission: RE | Admit: 2021-05-01 | Discharge: 2021-05-01 | Disposition: A | Discharge status: Home / Self Care | Payer: Medicare Other | Source: Ambulatory Visit | Attending: Family Medicine | Admitting: Family Medicine

## 2021-05-01 ENCOUNTER — Telehealth: Payer: Self-pay

## 2021-05-01 DIAGNOSIS — M7989 Other specified soft tissue disorders: Secondary | ICD-10-CM | POA: Insufficient documentation

## 2021-05-01 DIAGNOSIS — M79605 Pain in left leg: Secondary | ICD-10-CM | POA: Diagnosis not present

## 2021-05-01 DIAGNOSIS — M79662 Pain in left lower leg: Secondary | ICD-10-CM | POA: Insufficient documentation

## 2021-05-01 DIAGNOSIS — N183 Chronic kidney disease, stage 3 unspecified: Secondary | ICD-10-CM

## 2021-05-01 NOTE — Telephone Encounter (Signed)
Duplicate message sent. Please refer to other telephone encounter ?

## 2021-05-01 NOTE — Telephone Encounter (Signed)
FYI: ?Pt just wanted to inform Doctor ?Pt  called said she has an ultrasound tomorrow 4:00 pm ?Possibly a blood clot ?

## 2021-05-01 NOTE — Telephone Encounter (Signed)
-----   Message from [name], MD sent at 05/01/2021 10:02 AM EDT ----- ?Labs all good except kidney function is down a little bit from her baseline. ?Is her venous Doppler ultrasound scheduled? ?Okay to continue Lasix at the 40 mg daily dosing today and tomorrow but want her to come by for repeat basic metabolic panel tomorrow, diagnosis chronic renal insufficiency stage III.  Thanks. ?

## 2021-05-01 NOTE — Telephone Encounter (Signed)
FYI. Please see below. Pt has appt for today ?

## 2021-05-01 NOTE — Telephone Encounter (Signed)
noted 

## 2021-05-01 NOTE — Telephone Encounter (Signed)
Pt just wanted to inform Doc ?

## 2021-05-02 ENCOUNTER — Ambulatory Visit (INDEPENDENT_AMBULATORY_CARE_PROVIDER_SITE_OTHER): Payer: Medicare Other

## 2021-05-02 DIAGNOSIS — N183 Chronic kidney disease, stage 3 unspecified: Secondary | ICD-10-CM | POA: Diagnosis not present

## 2021-05-02 LAB — BASIC METABOLIC PANEL
BUN: 30 mg/dL — ABNORMAL HIGH (ref 6–23)
CO2: 26 mEq/L (ref 19–32)
Calcium: 9.5 mg/dL (ref 8.4–10.5)
Chloride: 103 mEq/L (ref 96–112)
Creatinine, Ser: 1.11 mg/dL (ref 0.40–1.20)
GFR: 45.81 mL/min — ABNORMAL LOW (ref >60.00)
Glucose, Bld: 82 mg/dL (ref 70–99)
Potassium: 3.5 mEq/L (ref 3.5–5.1)
Sodium: 142 mEq/L (ref 135–145)

## 2021-05-03 ENCOUNTER — Ambulatory Visit (INDEPENDENT_AMBULATORY_CARE_PROVIDER_SITE_OTHER): Payer: Medicare Other | Admitting: Pulmonary Disease

## 2021-05-03 ENCOUNTER — Encounter: Payer: Self-pay | Admitting: Pulmonary Disease

## 2021-05-03 VITALS — BP 128/68 | HR 68 | Temp 98.8°F | Ht 60.0 in | Wt 132.8 lb

## 2021-05-03 DIAGNOSIS — J9611 Chronic respiratory failure with hypoxia: Secondary | ICD-10-CM | POA: Diagnosis not present

## 2021-05-03 DIAGNOSIS — J449 Chronic obstructive pulmonary disease, unspecified: Secondary | ICD-10-CM | POA: Diagnosis not present

## 2021-05-03 DIAGNOSIS — R058 Other specified cough: Secondary | ICD-10-CM | POA: Diagnosis not present

## 2021-05-03 DIAGNOSIS — M7989 Other specified soft tissue disorders: Secondary | ICD-10-CM

## 2021-05-03 NOTE — Progress Notes (Signed)
? ?[name] ? ?Chief Complaint  ?Patient presents with  ? Follow-up  ?  Same since last OV ?Still using O2 prn during day   ? ? ?Constitutional:  ?BP 128/68 (BP Location: Left Arm, Patient Position: Sitting)   Pulse 68   Temp 98.8 ?F (37.1 ?C) (Temporal)   Ht 5' (1.524 m)   Wt 132 lb 12.8 oz (60.2 kg)   SpO2 94% Comment: ra  BMI 25.94 kg/m?  ? ?Past Medical History:  ?Chronic cystitis, DDD, Anxiety, Depression, C diff 2017 and 2018, Diverticulosis, GERD, HTN, Hypothyroidism, PNA, Shingles, Spinal stenosis ? ?Past Surgical History:  ?She  has a past surgical history that includes Cholecystectomy (2001); Tubal ligation (1974); Abdominal hysterectomy (1978); Breast cyst excision; Appendectomy; Tonsillectomy; Colonoscopy (04/2005; 12/06/2015); transthoracic echocardiogram (08/2014); Total hip arthroplasty (Right, 11/01/2014); Hip Closed Reduction (Right, 12/25/2015); DEXA (05/18/2017; 08/7019); Total hip arthroplasty (Left, 12/08/2017); and NCV/EMS (10/2018). ? ?Brief Summary:  ?Priscilla Cantrell is a 86 y.o. female former smoker with GOLD 4 COPD, ACE cough, and nocturnal hypoxemia.  ?  ? ? ? ?Subjective:  ? ?She started working at [employer].  She feels getting out and being more socially active has helped her mood.  She also has a new boyfriend.   ? ?Breathing is okay.  She sometimes has trouble controlling her bladder if she exerts too much.  She is not having much cough or wheeze.  Sleeping okay.  Has swelling in her Lt leg.  Doppler was negative for DVT.  She has noticed dark colored stool.  She has stool testing ordered and will by following up with her PCP. ? ?Physical Exam:  ? ?Appearance - well kempt  ? ?ENMT - no sinus tenderness, no oral exudate, no LAN, Mallampati 2 airway, no stridor ? ?Respiratory - equal breath sounds bilaterally, no wheezing or rales ? ?CV - s1s2 regular rate and rhythm, no murmurs ? ?Ext - left mid-calf swelling ? ?Skin - no rashes ? ?Psych -  normal mood and affect ? ? ?  ?Pulmonary testing:  ?Spirometry 01/10/09>>FEV1 0.97(52%), FEV1% 47 ?March 2012>>Daliresp started ?RAST 05/04/17 >> negative, IgE 8 ?Spirometry 05/14/17 >> FEV1 0.91 (55%), FEV1% 48 ?PFT 10/29/18 >> FEV1 1.05 (68%), FEV1% 52, TLC 4.29 (96%), DLCO 44% ? ?Sleep Tests:  ?PSG 11/14/11 >> AHI 0.3 ? ONO with RA 07/31/16 >> test time 6 hrs 41 min.  Average SpO2 87%, low SpO2 74%.  Spent 4 hrs 13 min with SpO2 < 88%. ? ?Cardiac Tests:  ?Echo 08/30/14 >> EF 60 to 16%, grade 1 diastolic dysfx, PAS 24 mmHG ? ?Social History:  ?She  reports that she quit smoking about 31 years ago. Her smoking use included cigarettes. She started smoking about 65 years ago. She has a 51.00 pack-year smoking history. She has never used smokeless tobacco. She reports current alcohol use. She reports that she does not use drugs. ? ?Family History:  ?Her family history includes Cancer in her maternal aunt and paternal grandfather; Cirrhosis in her father; Diabetes in her daughter; Goiter in her mother; Hernia in her paternal grandmother; Hypertension in her paternal grandmother; Psoriasis in her father; Stroke in her maternal grandfather and paternal grandmother. ?  ? ? ?Assessment/Plan:  ? ?GOLD 4 COPD with chronic bronchitis. ?- continue trelegy and daliresp ?- prn albuterol ? ?Allergic rhinitis with upper airway cough. ?- prn zyrtec, azelastine, flonase ?  ?Chronic respiratory failure with hypoxia. ?- goal SpO2 > 90% ?- she uses 2  liters at night and prn during the day ?- advised her to monitor her SpO2 while exerting to determine if her levels are running low with activity ? ?Lt leg swelling, dark colored stool. ?- she has follow up with PCP next week ? ?Time Spent Involved in Patient Care on Day of Examination:  ?35 minutes ? ?Follow up:  ? ?Patient Instructions  ?Follow up in 6 months ? ?Medication List:  ? ?Allergies as of 05/03/2021   ? ?   Reactions  ? Losartan Other (See Comments)  ? hyperkalemia  ? Penicillins  Hives, Swelling, Rash  ? Has patient had a PCN reaction causing immediate rash, facial/tongue/throat swelling, SOB or lightheadedness with hypotension: YES ?Has patient had a PCN reaction causing severe rash involving mucus membranes or skin necrosis: NO ?Has patient had a PCN reaction that required hospitalization NO ?Has patient had a PCN reaction occurring within the last 10 years: NO ?If all of the above answers are "NO", then may proceed with Cephalosporin use.  ? Cefixime [kdc:cefixime] Other (See Comments)  ? unspecified  ? Gabapentin Other (See Comments)  ? Feels woozy  ? Indocin [indomethacin] Other (See Comments)  ? Painful tongue and throat  ? Singulair [montelukast Sodium]   ? Chest tightness, decreased mental clarity  ? Sulfa Antibiotics Other (See Comments)  ? unspecified  ? Ace Inhibitors Other (See Comments)  ? cough  ? Statins Other (See Comments)  ? Myalgias  ? ?  ? ?  ?Medication List  ?  ? ?  ? Accurate as of May 03, 2021  1:26 PM. If you have any questions, ask your nurse or doctor.  ?  ?  ? ?  ? ?albuterol 108 (90 Base) MCG/ACT inhaler ?Commonly known as: ProAir HFA ?Inhale 2 puffs into the lungs every 4 (four) hours as needed for wheezing or shortness of breath. ?  ?alendronate 70 MG tablet ?Commonly known as: FOSAMAX ?TAKE 1 TABLET(70 MG) BY MOUTH 1 TIME A WEEK WITH A FULL GLASS OF WATER AND ON AN EMPTY STOMACH ?  ?amLODipine 10 MG tablet ?Commonly known as: NORVASC ?Take 1 tablet (10 mg total) by mouth daily. ?  ?aspirin EC 81 MG tablet ?Take 81 mg by mouth daily. ?  ?azelastine 0.1 % nasal spray ?Commonly known as: ASTELIN ?Place 2 sprays into both nostrils 2 (two) times daily. Use in each nostril as directed ?  ?cetirizine 10 MG tablet ?Commonly known as: ZYRTEC ?Take 1 tablet (10 mg total) by mouth daily. ?  ?clobetasol 0.05 % external solution ?Commonly known as: TEMOVATE ?APPLY TO THE AFFECTED AREA EVERY DAY ?  ?diazepam 5 MG tablet ?Commonly known as: VALIUM ?TAKE 1 TABLET BY MOUTH  DAILY AS NEEDED FOR ANXIETY AND INSOMNIA ?  ?fluorometholone 0.1 % ophthalmic suspension ?Commonly known as: FML ?Place 1 drop into both eyes as directed. Tapering off ?  ?fluticasone 50 MCG/ACT nasal spray ?Commonly known as: FLONASE ?Place 2 sprays into both nostrils daily. ?  ?furosemide 20 MG tablet ?Commonly known as: LASIX ?TAKE 1 TABLET BY MOUTH EVERY DAY AS NEEDED FOR INCREASED FLUID COLLECTION IN LEGS ?  ?Gemtesa 75 MG Tabs ?Generic drug: Vibegron ?Take 1 capsule by mouth daily. ?  ?nitrofurantoin 50 MG capsule ?Commonly known as: MACRODANTIN ?TAKE 1 CAPSULE(50 MG) BY MOUTH AT BEDTIME ?  ?nitrofurantoin 50 MG capsule ?Commonly known as: MACRODANTIN ?Take 1 capsule (50 mg total) by mouth at bedtime. ?  ?PROBIOTIC DAILY PO ?Take 1 capsule by mouth daily. ?  ?  RESTASIS OP ?Apply to eye. ?  ?roflumilast 500 MCG Tabs tablet ?Commonly known as: DALIRESP ?Take 1 tablet (500 mcg total) by mouth daily. ?  ?Soothe XP Soln ?Place 1 drop into both eyes 2 (two) times daily as needed (irritation). ?  ?Synthroid 88 MCG tablet ?Generic drug: levothyroxine ?TAKE 1 TABLET(88 MCG) BY MOUTH DAILY ?  ?Trelegy Ellipta 100-62.5-25 MCG/ACT Aepb ?Generic drug: Fluticasone-Umeclidin-Vilant ?Inhale 1 puff into the lungs daily. ?  ?triamcinolone cream 0.1 % ?Commonly known as: KENALOG ?Apply 1 application topically 2 (two) times daily. ?  ?venlafaxine 50 MG tablet ?Commonly known as: EFFEXOR ?Take 1 tablet (50 mg total) by mouth 2 (two) times daily. ?  ?Visine 0.05 % ophthalmic solution ?Generic drug: tetrahydrozoline ?1 drop  as needed ?  ?vitamin B-12 100 MCG tablet ?Commonly known as: CYANOCOBALAMIN ?Take 100 mcg by mouth daily. ?  ?VITAMIN D PO ?Take by mouth. ?  ?Xiidra 5 % Soln ?Generic drug: Lifitegrast ?Apply to eye in the morning and at bedtime. ?  ? ?  ? ? ?Signature:  ?Chesley Mires, MD ?[place] ?Pager - [phone] - [phone] ?05/03/2021, 1:26 PM ?  ? ? ? ? ? ? ? ? ?

## 2021-05-03 NOTE — Patient Instructions (Signed)
Follow up in 6 months 

## 2021-05-06 ENCOUNTER — Ambulatory Visit: Payer: Medicare Other | Admitting: Neurology

## 2021-05-06 ENCOUNTER — Ambulatory Visit (INDEPENDENT_AMBULATORY_CARE_PROVIDER_SITE_OTHER): Payer: Medicare Other | Admitting: Family Medicine

## 2021-05-06 ENCOUNTER — Encounter: Payer: Self-pay | Admitting: Family Medicine

## 2021-05-06 VITALS — BP 140/70 | HR 65 | Temp 98.0°F | Wt 135.2 lb

## 2021-05-06 DIAGNOSIS — R600 Localized edema: Secondary | ICD-10-CM | POA: Diagnosis not present

## 2021-05-06 DIAGNOSIS — I872 Venous insufficiency (chronic) (peripheral): Secondary | ICD-10-CM

## 2021-05-06 MED ORDER — FUROSEMIDE 20 MG PO TABS
ORAL_TABLET | ORAL | 1 refills | Status: DC
Start: 2021-05-06 — End: 2021-08-02

## 2021-05-06 NOTE — Progress Notes (Signed)
OFFICE VISIT ? ?05/06/2021 ? ?CC: f/u swelling ? ? ?Patient is a 86 y.o. female who presents for 1 week f/u LE swelling. ?A/P as of last visit: ?"#1 acute left greater than right lower extremity edema, superimposed on chronic edema due to venous insufficiency. ?Will obtain US venous left lower extremity to rule out DVT. ?CBC, be met, D-dimer, PT/INR today. ?Okay to push Lasix dose to 40 mg a day for now.   ?Compression stockings, sodium limitation emphasized---> she seems compliant with this. ?  ?#2 Black stools.  Not clear whether melena or not. ?Hemoccult cards x 3 ordered. ?CBC today" ? ?INTERIM HX: ?Says legs still significantly swollen. ?Has been taking 20 mg Lasix per day for last 4 days. ?Renal function recheck on 3/30 showed that it returned to near her baseline. ? ?Sodium limitation in diet could be better. ?She is on her feet most of the day at work but ambulates for most of this time. ?Wears compression stockings sometimes but find these hard to get on and off. ?She does not elevate her legs above the level of her heart. ? ? ?Past Medical History:  ?Diagnosis Date  ? Allergic rhinitis   ? with upper airway cough: as of 07/2017 pulm f/u she was instructed to use astelin, flonase, and saline more consistently  ? BPPV (benign paroxysmal positional vertigo) 03/2018  ? Chest pain, non-cardiac   ? Cardiac CT showed no signif obstructive dz, mildly elevated calcium level (Dr. [name]).  ? Chronic cystitis with hematuria   ? [name]  ? Chronic hypoxemic respiratory failure ([place]) 02/02/2015  ? 2L oxygen 24/7  ? Chronic renal insufficiency, stage 2 (mild)   ? GFR approx 60  ? Cold hands   ? NCS/EMGs normal.  Hand dysesthesias per neuro--reassured  ? COPD (chronic obstructive pulmonary disease) ([place])   ? GOLD 4.  spirometry 01/10/09 FEV 0.97(52%), FEV1% 47.  With chronic bronchitis as of 07/2017.  ? DDD (degenerative disc disease), cervical   ? 1998 MRI C5-6 impingemt (left).  MRI 2021 (neurol) 1 level  canal stenosis, 1 level foraminal stenosis--->PT  ? DDD (degenerative disc disease), lumbar   ? Depression with anxiety 04/15/2011  ? Diarrheal disease Summer 2017  ? ? 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).  As of 10/05/15 pt reports GI dx'd her with C diff and rx'd more flagyl and she is also on cholestyramine.  As of 02/2016, pt's sx's resolved completely.  ? Diverticulosis of colon   ? Procto '97 and colonoscopy 2002  ? Dysplastic nevus of upper extremity 04/2014  ? R tricep ([name])  ? Edema of both lower extremities due to peripheral venous insufficiency   ? Varicosities bilat, swelling L>R.  R baker's cyst.  Got vein clinic eval 02/2018->sclerotherapy recommended but since no hemorrhage or ulceration, insurance will not cover the procedure.  ? Family history of adverse reaction to anesthesia   ? mother died when patient age 62 receiving anesthesia for thyroid surgery  ? Fibrocystic breast disease   ? w/fibroadenoma.  Bx's showed NO atypia (Dr. Margot Chimes).  Mammo neg 2008.  ? GERD (gastroesophageal reflux disease)   ? History of double vision   ? Ophthalmologist, Dr. Herbert Deaner, is further evaluating this with MRI  orbits and limited brain (myesthenia gravis testing neg 07/2010)  ? History of home oxygen therapy   ? uses 2 liters at hs  ? Hypertension   ?  EKG 03/2010 normal  ? Hypothyroidism   ? Hashimoto's, dx'd 1989  ? Idiopathic peripheral neuropathy 04/2018  ? Sensory (numb feet) S1 distribution bilat, c/w spinal stenosis sensory neuropathy (no pain). 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  Labs [place], MRI C spine->some spondylosis/stenosis.  UE NCS/EMS: normal 04/2019.  ? Lesion of right native kidney 2017  ? found on CT 02/2015.  F/u MRI 03/31/15 showed it to be smaller and less likely of any significance but repeat CT renal protocol in [place]  recommended by radiology.  This was done 10/26/15 and showed no interval change in the lesion, but b/c the lesion enhances with contrast, radiol rec'd urol consult (b/c pap renal RCC could not be excluded).  Saw urol 03/06/16--stable on u/s 09/2016 and 09/2017.  ? OAB (overactive bladder)   ? Osteoarthritis, hip, bilateral   ? she is s/p bilat THA as of 02/2018.  ? Osteoporosis   ? radius, T score -2.5->rec'd alendronate 08/2019->plan rpt DEXA 08/2021.  ? Pneumonia   ? Postmenopausal atrophic vaginitis   ? improved with estrace 2 X/week.  ? Prolapse of vaginal cuff after hysterectomy   ? 10/2018: Dr. FWayne Both Mild colpocele 04/2019 per GYN (no surgery)  ? Pulmonary nodule   ? CT chest 12/10, June 2011.  No nodule on CT 06/2010.  ? Recurrent Clostridium difficile diarrhea   ? Episode 09/19/2016: resolved with prolonged course of flagyl.  11/2016 recurrence treated with 10d of Dificid (Fidaxomicin) by GI (Dr. MEarlean Shawl.  ? Recurrent UTI   ? likely related to pt's atrophic vaginitis.  Urol started vaginal estrace 03/2016.  ? Rib fracture 09/2018  ? R 7th, laterally-->sustained when her dog pulled her over and she fell.  ? RLS (restless legs syndrome)   ? neuro->gabapentin trial 2020/21  ? Shingles 02/2014  ? R abd/flank  ? Spinal stenosis of lumbar region with neurogenic claudication   ? 2008 MRI (L3-4, L4-5, L5-S1).  +S1 distribution sensory loss bilat  ? ? ?Past Surgical History:  ?Procedure Laterality Date  ? ABDOMINAL HYSTERECTOMY  1978  ? no BSO per pt--nonmalignant reasons  ? APPENDECTOMY    ? BREAST CYST EXCISION    ? Last screening mammogram 10/2010 was normal.  ? CHOLECYSTECTOMY  2001  ? COLONOSCOPY  04/2005; 12/06/2015  ? 2007 NORMAL.  2017 adenomatous polyp x 1.  Mod sigmoid diverticulosis (Dr. MEarlean Shawl.    ? DEXA  05/18/2017; 08/7019  ? 2019 NORMAL (T-score 0.1).  08/2019->T score -2.5->alendronate started->plan Rpt 08/2021.  ? HIP CLOSED REDUCTION Right 12/25/2015  ? Procedure: CLOSED REDUCTION HIP;  Surgeon: JNicholes Stairs MD;  Location: [facility];  Service: Orthopedics;  Laterality: Right;  ? NCV/EMS  10/2018  ? chronic and symmetric sensorimotor axonal polyneuropathy affecting the lower extremities  ? TONSILLECTOMY    ? TOTAL HIP ARTHROPLASTY Right 11/01/2014  ? Procedure: RIGHT TOTAL HIP ARTHROPLASTY;  Surgeon: RLatanya Maudlin MD;  Location: [facility];  Service: Orthopedics;  Laterality: Right;  ? TOTAL HIP ARTHROPLASTY Left 12/08/2017  ? Procedure: LEFT TOTAL HIP ARTHROPLASTY ANTERIOR APPROACH;  Surgeon: OParalee Cancel MD;  Location: [facility];  Service: Orthopedics;  Laterality: Left;  70 mins  ? TRANSTHORACIC ECHOCARDIOGRAM  08/2014  ? EF 60-65%, grade I diast dysfxn  ? TUBAL LIGATION  1974  ? ? ?Outpatient Medications Prior to Visit  ?Medication Sig Dispense Refill  ? albuterol (PROAIR HFA) 108 (90 Base) MCG/ACT inhaler Inhale 2 puffs into the lungs every 4 (  four) hours as needed for wheezing or shortness of breath. 8.5 g 1  ? alendronate (FOSAMAX) 70 MG tablet TAKE 1 TABLET(70 MG) BY MOUTH 1 TIME A WEEK WITH A FULL GLASS OF WATER AND ON AN EMPTY STOMACH 12 tablet 3  ? amLODipine (NORVASC) 10 MG tablet Take 1 tablet (10 mg total) by mouth daily. 30 tablet 1  ? Artificial Tear Solution (SOOTHE XP) SOLN Place 1 drop into both eyes 2 (two) times daily as needed (irritation).    ? aspirin EC 81 MG tablet Take 81 mg by mouth daily.    ? azelastine (ASTELIN) 0.1 % nasal spray Place 2 sprays into both nostrils 2 (two) times daily. Use in each nostril as directed 30 mL 3  ? cetirizine (ZYRTEC) 10 MG tablet Take 1 tablet (10 mg total) by mouth daily. 30 tablet 6  ? clobetasol (TEMOVATE) 0.05 % external solution APPLY TO THE AFFECTED AREA EVERY DAY 50 mL 4  ? cycloSPORINE (RESTASIS OP) Apply to eye.    ? diazepam (VALIUM) 5 MG tablet TAKE 1 TABLET BY MOUTH DAILY AS NEEDED FOR ANXIETY AND INSOMNIA 30 tablet 5  ? fluorometholone (FML) 0.1 % ophthalmic suspension Place 1 drop into both eyes as directed. Tapering off    ? fluticasone (FLONASE)  50 MCG/ACT nasal spray Place 2 sprays into both nostrils daily. 16 g 6  ? Fluticasone-Umeclidin-Vilant (TRELEGY ELLIPTA) 100-62.5-25 MCG/ACT AEPB Inhale 1 puff into the lungs daily. 180 each 3  ? furosemide (

## 2021-05-08 ENCOUNTER — Ambulatory Visit: Payer: Medicare Other | Admitting: Neurology

## 2021-05-13 NOTE — Telephone Encounter (Signed)
error 

## 2021-05-15 ENCOUNTER — Encounter: Payer: Self-pay | Admitting: Family Medicine

## 2021-05-15 ENCOUNTER — Ambulatory Visit (INDEPENDENT_AMBULATORY_CARE_PROVIDER_SITE_OTHER): Payer: Medicare Other | Admitting: Family Medicine

## 2021-05-15 ENCOUNTER — Ambulatory Visit: Payer: Medicare Other | Admitting: Family Medicine

## 2021-05-15 VITALS — BP 148/70 | HR 62 | Temp 97.6°F | Ht 60.0 in | Wt 130.0 lb

## 2021-05-15 DIAGNOSIS — I872 Venous insufficiency (chronic) (peripheral): Secondary | ICD-10-CM

## 2021-05-15 DIAGNOSIS — R600 Localized edema: Secondary | ICD-10-CM

## 2021-05-15 MED ORDER — FLUTICASONE PROPIONATE 0.05 % EX CREA: TOPICAL_CREAM | Freq: Two times a day (BID) | CUTANEOUS | 1 refills | Status: DC

## 2021-05-15 NOTE — Patient Instructions (Signed)
Take 2 more Lasix tabs today. ?Take 3 Lasix tabs tomorrow. ?Then, starting Friday take 2 tabs a day alternating with 3 tabs a day. ?

## 2021-05-15 NOTE — Progress Notes (Signed)
OFFICE VISIT ? ?05/15/2021 ? ?CC: legs swelling ? ?Patient is a 86 y.o. female who presents for swollen legs. ?I last saw her for this problem 9 days ago. ?A/P as of that visit: ?"Progressive bilateral lower extremity edema. ?History of venous insufficiency. ?Venous duplex Doppler NEG for DVT on 05/01/21. ?Okay to increase Lasix to 40 mg/day alternating with 20 mg/day. ?Recheck bmet 1 week. ?Will make new referral back to Dr. Renaldo Reel at [facility].  She last saw him back in 2020. ?Encouraged her to get more strict with sodium limitation, compression stockings use, and elevation of legs." ? ?INTERIM HX: ?Says no change in lower extremity swelling since I last saw her 9 days ago. ?Says the Lasix is not making her urinate as much as she was used to. ?Taking 40 mg a day alternating with 20 mg a day. ?Now seeing itchy pinkish rash on left lower leg. ?She eats at [employer] 3 days/week when she works. ?Not wearing compression stockings, not clear why. ? ? ?ROS as above, plus--> no fatigue, no fevers, no CP, no SOB, no wheezing, no cough, no dizziness, no HAs, no rashes, no melena/hematochezia.  No polyuria or polydipsia.  No myalgias or arthralgias.  No focal weakness, paresthesias, or tremors.  No acute vision or hearing abnormalities.  No dysuria or unusual/new urinary urgency or frequency.  No n/v/d or abd pain.  No palpitations.   ? ?Past Medical History:  ?Diagnosis Date  ? Allergic rhinitis   ? with upper airway cough: as of 07/2017 pulm f/u she was instructed to use astelin, flonase, and saline more consistently  ? BPPV (benign paroxysmal positional vertigo) 03/2018  ? Chest pain, non-cardiac   ? Cardiac CT showed no signif obstructive dz, mildly elevated calcium level (Dr. [name]).  ? Chronic cystitis with hematuria   ? [name]  ? Chronic hypoxemic respiratory failure ([place]) 02/02/2015  ? 2L oxygen 24/7  ? Chronic renal insufficiency, stage 2 (mild)   ? GFR approx 60  ? Cold  hands   ? NCS/EMGs normal.  Hand dysesthesias per neuro--reassured  ? COPD (chronic obstructive pulmonary disease) ([place])   ? GOLD 4.  spirometry 01/10/09 FEV 0.97(52%), FEV1% 47.  With chronic bronchitis as of 07/2017.  ? DDD (degenerative disc disease), cervical   ? 1998 MRI C5-6 impingemt (left).  MRI 2021 (neurol) 1 level canal stenosis, 1 level foraminal stenosis--->PT  ? DDD (degenerative disc disease), lumbar   ? Depression with anxiety 04/15/2011  ? Diarrheal disease Summer 2017  ? ? 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).  As of 10/05/15 pt reports GI dx'd her with C diff and rx'd more flagyl and she is also on cholestyramine.  As of 02/2016, pt's sx's resolved completely.  ? Diverticulosis of colon   ? Procto '97 and colonoscopy 2002  ? Dysplastic nevus of upper extremity 04/2014  ? R tricep ([name])  ? Edema of both lower extremities due to peripheral venous insufficiency   ? Varicosities bilat, swelling L>R.  R baker's cyst.  Got vein clinic eval 02/2018->sclerotherapy recommended but since no hemorrhage or ulceration, insurance will not cover the procedure.  ? Family history of adverse reaction to anesthesia   ? mother died when patient age 65 receiving anesthesia for thyroid surgery  ? Fibrocystic breast disease   ? w/fibroadenoma.  Bx's showed NO atypia (Dr. Margot Chimes).  Mammo neg 2008.  ?  GERD (gastroesophageal reflux disease)   ? History of double vision   ? Ophthalmologist, Dr. Herbert Deaner, is further evaluating this with MRI  orbits and limited brain (myesthenia gravis testing neg 07/2010)  ? History of home oxygen therapy   ? uses 2 liters at hs  ? Hypertension   ? EKG 03/2010 normal  ? Hypothyroidism   ? Hashimoto's, dx'd 1989  ? Idiopathic peripheral neuropathy 04/2018  ? Sensory (numb feet) S1 distribution bilat, c/w spinal stenosis sensory neuropathy (no pain). 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  Labs [place], MRI C spine->some spondylosis/stenosis.  UE NCS/EMS: normal 04/2019.  ? Lesion of right native kidney 2017  ? found on CT 02/2015.  F/u MRI 03/31/15 showed it to be smaller and less likely of any significance but repeat CT renal protocol in 68mowas recommended by radiology.  This was done 10/26/15 and showed no interval change in the lesion, but b/c the lesion enhances with contrast, radiol rec'd urol consult (b/c pap renal RCC could not be excluded).  Saw urol 03/06/16--stable on u/s 09/2016 and 09/2017.  ? OAB (overactive bladder)   ? Osteoarthritis, hip, bilateral   ? she is s/p bilat THA as of 02/2018.  ? Osteoporosis   ? radius, T score -2.5->rec'd alendronate 08/2019->plan rpt DEXA 08/2021.  ? Pneumonia   ? Postmenopausal atrophic vaginitis   ? improved with estrace 2 X/week.  ? Prolapse of vaginal cuff after hysterectomy   ? 10/2018: Dr. FWayne Both Mild colpocele 04/2019 per GYN (no surgery)  ? Pulmonary nodule   ? CT chest 12/10, June 2011.  No nodule on CT 06/2010.  ? Recurrent Clostridium difficile diarrhea   ? Episode 09/19/2016: resolved with prolonged course of flagyl.  11/2016 recurrence treated with 10d of Dificid (Fidaxomicin) by GI (Dr. MEarlean Shawl.  ? Recurrent UTI   ? likely related to pt's atrophic vaginitis.  Urol started vaginal estrace 03/2016.  ? Rib fracture 09/2018  ? R 7th, laterally-->sustained when her dog pulled her over and she fell.  ? RLS (restless legs syndrome)   ? neuro->gabapentin trial 2020/21  ? Shingles 02/2014  ? R abd/flank  ? Spinal stenosis of lumbar region with neurogenic claudication   ? 2008 MRI (L3-4, L4-5, L5-S1).  +S1 distribution sensory loss bilat  ? ? ?Past Surgical History:  ?Procedure Laterality Date  ? ABDOMINAL HYSTERECTOMY  1978  ? no BSO per pt--nonmalignant reasons  ? APPENDECTOMY    ? BREAST CYST EXCISION    ? Last screening mammogram 10/2010 was normal.  ? CHOLECYSTECTOMY  2001  ? COLONOSCOPY   04/2005; 12/06/2015  ? 2007 NORMAL.  2017 adenomatous polyp x 1.  Mod sigmoid diverticulosis (Dr. MEarlean Shawl.    ? DEXA  05/18/2017; 08/7019  ? 2019 NORMAL (T-score 0.1).  08/2019->T score -2.5->alendronate started->plan Rpt 08/2021.  ? HIP CLOSED REDUCTION Right 12/25/2015  ? Procedure: CLOSED REDUCTION HIP;  Surgeon: JNicholes Stairs MD;  Location: [facility];  Service: Orthopedics;  Laterality: Right;  ? NCV/EMS  10/2018  ? chronic and symmetric sensorimotor axonal polyneuropathy affecting the lower extremities  ? TONSILLECTOMY    ? TOTAL HIP ARTHROPLASTY Right 11/01/2014  ? Procedure: RIGHT TOTAL HIP ARTHROPLASTY;  Surgeon: RLatanya Maudlin MD;  Location: [facility];  Service: Orthopedics;  Laterality: Right;  ? TOTAL HIP ARTHROPLASTY Left 12/08/2017  ? Procedure: LEFT TOTAL HIP ARTHROPLASTY ANTERIOR APPROACH;  Surgeon: OParalee Cancel MD;  Location: [facility];  Service: Orthopedics;  Laterality: Left;  70 mins  ? TRANSTHORACIC ECHOCARDIOGRAM  08/2014  ? EF 60-65%, grade I diast dysfxn  ? TUBAL LIGATION  1974  ? ? ?Outpatient Medications Prior to Visit  ?Medication Sig Dispense Refill  ? albuterol (PROAIR HFA) 108 (90 Base) MCG/ACT inhaler Inhale 2 puffs into the lungs every 4 (four) hours as needed for wheezing or shortness of breath. 8.5 g 1  ? alendronate (FOSAMAX) 70 MG tablet TAKE 1 TABLET(70 MG) BY MOUTH 1 TIME A WEEK WITH A FULL GLASS OF WATER AND ON AN EMPTY STOMACH 12 tablet 3  ? amLODipine (NORVASC) 10 MG tablet Take 1 tablet (10 mg total) by mouth daily. 30 tablet 1  ? Artificial Tear Solution (SOOTHE XP) SOLN Place 1 drop into both eyes 2 (two) times daily as needed (irritation).    ? aspirin EC 81 MG tablet Take 81 mg by mouth daily.    ? azelastine (ASTELIN) 0.1 % nasal spray Place 2 sprays into both nostrils 2 (two) times daily. Use in each nostril as directed 30 mL 3  ? cetirizine (ZYRTEC) 10 MG tablet Take 1 tablet (10 mg total) by mouth daily. 30 tablet 6  ? clobetasol (TEMOVATE) 0.05 % external solution APPLY TO  THE AFFECTED AREA EVERY DAY 50 mL 4  ? cycloSPORINE (RESTASIS OP) Apply to eye.    ? diazepam (VALIUM) 5 MG tablet TAKE 1 TABLET BY MOUTH DAILY AS NEEDED FOR ANXIETY AND INSOMNIA 30 tablet 5  ? fluorometholone

## 2021-05-15 NOTE — Progress Notes (Signed)
Pt missed this appt slot/did not show. She rescheduled for this afternoon. Signed:  Crissie Sickles, MD           05/15/2021

## 2021-05-21 ENCOUNTER — Telehealth: Payer: Self-pay | Admitting: Pharmacy Technician

## 2021-05-21 DIAGNOSIS — Z596 Low income: Secondary | ICD-10-CM

## 2021-05-21 NOTE — Progress Notes (Signed)
[place] [facility])  ?                                          [facility] Quality Pharmacy Team ?  ? ?05/21/2021 ? ?Sherald Barge ?[birth year] ?[id number] ? ?Received both patient and provider portion(s) of patient assistance application(s) for Trelegy. Faxed completed application and required documents into [employer]. ? ? ? ?Analayah Brooke P. Reis Goga, CPhT ?[place]  ?[phone] ?  ?

## 2021-05-24 ENCOUNTER — Encounter: Payer: Self-pay | Admitting: Family Medicine

## 2021-05-24 ENCOUNTER — Ambulatory Visit (INDEPENDENT_AMBULATORY_CARE_PROVIDER_SITE_OTHER): Payer: Medicare Other | Admitting: Family Medicine

## 2021-05-24 VITALS — BP 127/73 | HR 68 | Temp 97.5°F | Ht 60.0 in | Wt 130.2 lb

## 2021-05-24 DIAGNOSIS — I10 Essential (primary) hypertension: Secondary | ICD-10-CM

## 2021-05-24 DIAGNOSIS — E039 Hypothyroidism, unspecified: Secondary | ICD-10-CM

## 2021-05-24 DIAGNOSIS — R600 Localized edema: Secondary | ICD-10-CM | POA: Diagnosis not present

## 2021-05-24 DIAGNOSIS — N1831 Chronic kidney disease, stage 3a: Secondary | ICD-10-CM | POA: Diagnosis not present

## 2021-05-24 LAB — BASIC METABOLIC PANEL
BUN: 16 mg/dL (ref 6–23)
CO2: 26 mEq/L (ref 19–32)
Calcium: 9.3 mg/dL (ref 8.4–10.5)
Chloride: 103 mEq/L (ref 96–112)
Creatinine, Ser: 0.92 mg/dL (ref 0.40–1.20)
GFR: 57.36 mL/min — ABNORMAL LOW (ref >60.00)
Glucose, Bld: 76 mg/dL (ref 70–99)
Potassium: 3.6 mEq/L (ref 3.5–5.1)
Sodium: 142 mEq/L (ref 135–145)

## 2021-05-24 LAB — TSH: TSH: 19.49 u[IU]/mL — ABNORMAL HIGH (ref 0.35–5.50)

## 2021-05-24 LAB — MAGNESIUM: Magnesium: 2.2 mg/dL (ref 1.5–2.5)

## 2021-05-24 MED ORDER — SPIRONOLACTONE 25 MG PO TABS: 25.0000 mg | ORAL_TABLET | Freq: Every day | ORAL | 0 refills | Status: DC

## 2021-05-24 NOTE — Progress Notes (Signed)
OFFICE VISIT ? ?05/27/2021 ? ?CC:  ?Chief Complaint  ?Patient presents with  ? Leg Swelling  ?  Follow up; shooting pains and still swollen. Some nights she sleeps well and other nights she does not sleep.   ? ? ?HPI:   ? ?Patient is a 86 y.o. female who presents for 9 day f/u leg edema. ?A/P as of last visit: ?"1) Bilateral lower extremity edema d/t venous insufficiency. ?Venous duplex Doppler NEG for DVT on 05/01/21. ?Her response to Lasix is not as brisk as it had been in the past. ?Instructions: Take 2 more Lasix tabs today. ?Take 3 Lasix tabs tomorrow. ?Then, starting Friday take 2 tabs a day alternating with 3 tabs a day. ? ?At last visit I made a new referral back to Dr. Aleda Grana at vein [facility] vascular specialist.  She last saw him back in 2020.  She has not made appointment yet. ?Encouraged her to get more strict with sodium limitation, compression stockings use, and elevation of legs ?Recheck bmet 1 week. ?  ?#2 venous stasis dermatitis of left leg. ?Cutivate 0.05% cream, apply twice daily". ? ?INTERIM HX: ?No significant change in lower extremity swelling.  Has continued Lasix 40 mg daily alternating with 20 mg daily. ?Says she has been decreasing sodium intake lately. ?Weight is unchanged. ? ? ?Past Medical History:  ?Diagnosis Date  ? Allergic rhinitis   ? with upper airway cough: as of 07/2017 pulm f/u she was instructed to use astelin, flonase, and saline more consistently  ? BPPV (benign paroxysmal positional vertigo) 03/2018  ? Chest pain, non-cardiac   ? Cardiac CT showed no signif obstructive dz, mildly elevated calcium level (Dr. [name]).  ? Chronic cystitis with hematuria   ? [name]  ? Chronic hypoxemic respiratory failure ([place]) 02/02/2015  ? 2L oxygen 24/7  ? Chronic renal insufficiency, stage 2 (mild)   ? GFR approx 60  ? Cold hands   ? NCS/EMGs normal.  Hand dysesthesias per neuro--reassured  ? COPD (chronic obstructive pulmonary disease) ([place])   ? GOLD 4.  spirometry 01/10/09  FEV 0.97(52%), FEV1% 47.  With chronic bronchitis as of 07/2017.  ? DDD (degenerative disc disease), cervical   ? 1998 MRI C5-6 impingemt (left).  MRI 2021 (neurol) 1 level canal stenosis, 1 level foraminal stenosis--->PT  ? DDD (degenerative disc disease), lumbar   ? Depression with anxiety 04/15/2011  ? Diarrheal disease Summer 2017  ? ? 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).  As of 10/05/15 pt reports GI dx'd her with C diff and rx'd more flagyl and she is also on cholestyramine.  As of 02/2016, pt's sx's resolved completely.  ? Diverticulosis of colon   ? Procto '97 and colonoscopy 2002  ? Dysplastic nevus of upper extremity 04/2014  ? R tricep ([name])  ? Edema of both lower extremities due to peripheral venous insufficiency   ? Varicosities bilat, swelling L>R.  R baker's cyst.  Got vein clinic eval 02/2018->sclerotherapy recommended but since no hemorrhage or ulceration, insurance will not cover the procedure.  ? Family history of adverse reaction to anesthesia   ? mother died when patient age 58 receiving anesthesia for thyroid surgery  ? Fibrocystic breast disease   ? w/fibroadenoma.  Bx's showed NO atypia (Dr. Margot Chimes).  Mammo neg 2008.  ? GERD (gastroesophageal reflux disease)   ? History of double vision   ? Ophthalmologist, Dr. Herbert Deaner, is  further evaluating this with MRI  orbits and limited brain (myesthenia gravis testing neg 07/2010)  ? History of home oxygen therapy   ? uses 2 liters at hs  ? Hypertension   ? EKG 03/2010 normal  ? Hypothyroidism   ? Hashimoto's, dx'd 1989  ? Idiopathic peripheral neuropathy 04/2018  ? Sensory (numb feet) S1 distribution bilat, c/w spinal stenosis sensory neuropathy (no pain). 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  Labs [place], MRI C spine->some spondylosis/stenosis.  UE NCS/EMS: normal 04/2019.  ?  Lesion of right native kidney 2017  ? found on CT 02/2015.  F/u MRI 03/31/15 showed it to be smaller and less likely of any significance but repeat CT renal protocol in 56mowas recommended by radiology.  This was done 10/26/15 and showed no interval change in the lesion, but b/c the lesion enhances with contrast, radiol rec'd urol consult (b/c pap renal RCC could not be excluded).  Saw urol 03/06/16--stable on u/s 09/2016 and 09/2017.  ? OAB (overactive bladder)   ? Osteoarthritis, hip, bilateral   ? she is s/p bilat THA as of 02/2018.  ? Osteoporosis   ? radius, T score -2.5->rec'd alendronate 08/2019->plan rpt DEXA 08/2021.  ? Pneumonia   ? Postmenopausal atrophic vaginitis   ? improved with estrace 2 X/week.  ? Prolapse of vaginal cuff after hysterectomy   ? 10/2018: Dr. FWayne Both Mild colpocele 04/2019 per GYN (no surgery)  ? Pulmonary nodule   ? CT chest 12/10, June 2011.  No nodule on CT 06/2010.  ? Recurrent Clostridium difficile diarrhea   ? Episode 09/19/2016: resolved with prolonged course of flagyl.  11/2016 recurrence treated with 10d of Dificid (Fidaxomicin) by GI (Dr. MEarlean Shawl.  ? Recurrent UTI   ? likely related to pt's atrophic vaginitis.  Urol started vaginal estrace 03/2016.  ? Rib fracture 09/2018  ? R 7th, laterally-->sustained when her dog pulled her over and she fell.  ? RLS (restless legs syndrome)   ? neuro->gabapentin trial 2020/21  ? Shingles 02/2014  ? R abd/flank  ? Spinal stenosis of lumbar region with neurogenic claudication   ? 2008 MRI (L3-4, L4-5, L5-S1).  +S1 distribution sensory loss bilat  ? ? ?Past Surgical History:  ?Procedure Laterality Date  ? ABDOMINAL HYSTERECTOMY  1978  ? no BSO per pt--nonmalignant reasons  ? APPENDECTOMY    ? BREAST CYST EXCISION    ? Last screening mammogram 10/2010 was normal.  ? CHOLECYSTECTOMY  2001  ? COLONOSCOPY  04/2005; 12/06/2015  ? 2007 NORMAL.  2017 adenomatous polyp x 1.  Mod sigmoid diverticulosis (Dr. MEarlean Shawl.    ? DEXA  05/18/2017; 08/7019  ? 2019 NORMAL  (T-score 0.1).  08/2019->T score -2.5->alendronate started->plan Rpt 08/2021.  ? HIP CLOSED REDUCTION Right 12/25/2015  ? Procedure: CLOSED REDUCTION HIP;  Surgeon: JNicholes Stairs MD;  [facility];  Service: Orthopedics;  Laterality: Right;  ? NCV/EMS  10/2018  ? chronic and symmetric sensorimotor axonal polyneuropathy affecting the lower extremities  ? TONSILLECTOMY    ? TOTAL HIP ARTHROPLASTY Right 11/01/2014  ? Procedure: RIGHT TOTAL HIP ARTHROPLASTY;  Surgeon: RLatanya Maudlin MD;  [facility]: [facility];  Service: Orthopedics;  Laterality: Right;  ? TOTAL HIP ARTHROPLASTY Left 12/08/2017  ? Procedure: LEFT TOTAL HIP ARTHROPLASTY ANTERIOR APPROACH;  Surgeon: OParalee Cancel MD;  Location: [facility];  Service: Orthopedics;  Laterality: Left;  70 mins  ? TRANSTHORACIC ECHOCARDIOGRAM  08/2014  ? EF 60-65%, grade I diast dysfxn  ?  TUBAL LIGATION  1974  ? ? ?Outpatient Medications Prior to Visit  ?Medication Sig Dispense Refill  ? albuterol (PROAIR HFA) 108 (90 Base) MCG/ACT inhaler Inhale 2 puffs into the lungs every 4 (four) hours as needed for wheezing or shortness of breath. 8.5 g 1  ? alendronate (FOSAMAX) 70 MG tablet TAKE 1 TABLET(70 MG) BY MOUTH 1 TIME A WEEK WITH A FULL GLASS OF WATER AND ON AN EMPTY STOMACH 12 tablet 3  ? Artificial Tear Solution (SOOTHE XP) SOLN Place 1 drop into both eyes 2 (two) times daily as needed (irritation).    ? aspirin EC 81 MG tablet Take 81 mg by mouth daily.    ? azelastine (ASTELIN) 0.1 % nasal spray Place 2 sprays into both nostrils 2 (two) times daily. Use in each nostril as directed 30 mL 3  ? cetirizine (ZYRTEC) 10 MG tablet Take 1 tablet (10 mg total) by mouth daily. 30 tablet 6  ? clobetasol (TEMOVATE) 0.05 % external solution APPLY TO THE AFFECTED AREA EVERY DAY 50 mL 4  ? cycloSPORINE (RESTASIS OP) Apply to eye.    ? diazepam (VALIUM) 5 MG tablet TAKE 1 TABLET BY MOUTH DAILY AS NEEDED FOR ANXIETY AND INSOMNIA 30 tablet 5  ? fluorometholone (FML) 0.1 % ophthalmic  suspension Place 1 drop into both eyes as directed. Tapering off    ? fluticasone (CUTIVATE) 0.05 % cream Apply topically 2 (two) times daily. 30 g 1  ? fluticasone (FLONASE) 50 MCG/ACT nasal spray Place 2 sprays into b

## 2021-05-24 NOTE — Patient Instructions (Signed)
Stop amlodipine ? ?Start new bp med (spironolactone). ? ?Check blood pressure and heart rate once a day for the next week and bring these numbers in for review with me at next visit in 1 wk. ?

## 2021-05-27 ENCOUNTER — Telehealth: Payer: Self-pay

## 2021-05-27 NOTE — Telephone Encounter (Signed)
Please review and advise.

## 2021-05-27 NOTE — Telephone Encounter (Signed)
Yes

## 2021-05-27 NOTE — Telephone Encounter (Signed)
Patient wants to know if she should she still Vein Specialist on Thursday. ? ?Please call on her cell phone today [phone]. ?

## 2021-05-27 NOTE — Telephone Encounter (Signed)
LM for pt to return call. See refer to result note as well. ?

## 2021-05-28 NOTE — Telephone Encounter (Signed)
LM for pt to return call regarding results.  

## 2021-05-28 NOTE — Telephone Encounter (Signed)
LM for pt to return call. Please refer to result note as well. ?

## 2021-05-28 NOTE — Telephone Encounter (Signed)
Pt advised regarding recommendations ?

## 2021-05-29 ENCOUNTER — Telehealth: Payer: Self-pay

## 2021-05-29 DIAGNOSIS — R600 Localized edema: Secondary | ICD-10-CM | POA: Diagnosis not present

## 2021-05-29 DIAGNOSIS — I87392 Chronic venous hypertension (idiopathic) with other complications of left lower extremity: Secondary | ICD-10-CM | POA: Diagnosis not present

## 2021-05-29 DIAGNOSIS — I890 Lymphedema, not elsewhere classified: Secondary | ICD-10-CM | POA: Diagnosis not present

## 2021-05-29 DIAGNOSIS — E039 Hypothyroidism, unspecified: Secondary | ICD-10-CM

## 2021-05-29 MED ORDER — LEVOTHYROXINE SODIUM 100 MCG PO TABS: 100.0000 ug | ORAL_TABLET | Freq: Every day | ORAL | 2 refills | Status: DC

## 2021-05-29 NOTE — Telephone Encounter (Signed)
-----   Message from [name], MD sent at 05/29/2021 12:09 PM EDT ----- ?OK, pls do new rx for levothyroxine 100 mcg tabs, 1 tab po qd, #30, RF x 2. ?Needs nonfasting lab appt for TSH in 6 wks. ?(Also, check to make sure she has been taking the levothyroxine on empty stomach and without any vitamins or other meds). ?

## 2021-05-30 ENCOUNTER — Telehealth: Payer: Self-pay | Admitting: Pharmacy Technician

## 2021-05-30 DIAGNOSIS — Z596 Low income: Secondary | ICD-10-CM

## 2021-05-30 NOTE — Progress Notes (Signed)
[place] [place] [facility])  ?                                          [facility] Quality Pharmacy Team ?  ? ?05/30/2021 ? ?Priscilla Cantrell ?14-Oct-1935 ?[id number] ? ?Care coordination call placed to [place] in regard to Trelegy application. ? ?Spoke to Butch Penny who informs patient is APPROVED 05/28/21-02/02/22. She informs a 3 month supply will ship to patient's home in the next 7-14 business days. Patient will need to call [employer] when she needs her next refill. Patient is aware. ? ?Ollis Daudelin P. Andrena Margerum, CPhT ?[place]  ?[phone] ? ?

## 2021-06-03 ENCOUNTER — Other Ambulatory Visit (INDEPENDENT_AMBULATORY_CARE_PROVIDER_SITE_OTHER): Payer: Medicare Other

## 2021-06-03 ENCOUNTER — Other Ambulatory Visit: Payer: Self-pay | Admitting: *Deleted

## 2021-06-03 DIAGNOSIS — R600 Localized edema: Secondary | ICD-10-CM | POA: Diagnosis not present

## 2021-06-03 DIAGNOSIS — K921 Melena: Secondary | ICD-10-CM

## 2021-06-03 DIAGNOSIS — E039 Hypothyroidism, unspecified: Secondary | ICD-10-CM

## 2021-06-03 LAB — HEMOCCULT SLIDES (X 3 CARDS)
Fecal Occult Blood: NEGATIVE
OCCULT 1: NEGATIVE
OCCULT 2: NEGATIVE
OCCULT 3: NEGATIVE
OCCULT 4: NEGATIVE
OCCULT 5: NEGATIVE

## 2021-06-03 NOTE — Patient Outreach (Addendum)
[facility] [facility]) Care Management ? ?06/03/2021 ? ?Sherald Barge ?03/23/1935 ?[id number] ? ?RN Health Coach attempted follow up outreach call to patient.  Patient was unavailable.  ? ?Plan: ?RN will call patient again within 30 days. ? ?Johny Shock BSN RN ?[place] Management ?[phone] ? ? ?

## 2021-06-05 ENCOUNTER — Other Ambulatory Visit: Payer: Self-pay | Admitting: *Deleted

## 2021-06-05 ENCOUNTER — Telehealth: Payer: Self-pay | Admitting: Pharmacy Technician

## 2021-06-05 ENCOUNTER — Ambulatory Visit (HOSPITAL_BASED_OUTPATIENT_CLINIC_OR_DEPARTMENT_OTHER)
Admission: RE | Admit: 2021-06-05 | Discharge: 2021-06-05 | Disposition: A | Discharge status: Home / Self Care | Payer: Medicare Other | Source: Ambulatory Visit | Attending: Family Medicine | Admitting: Family Medicine

## 2021-06-05 ENCOUNTER — Encounter: Payer: Self-pay | Admitting: Family Medicine

## 2021-06-05 ENCOUNTER — Ambulatory Visit (INDEPENDENT_AMBULATORY_CARE_PROVIDER_SITE_OTHER): Payer: Medicare Other | Admitting: Family Medicine

## 2021-06-05 VITALS — BP 160/81 | HR 58 | Temp 97.6°F | Ht 60.0 in | Wt 130.2 lb

## 2021-06-05 DIAGNOSIS — M7989 Other specified soft tissue disorders: Secondary | ICD-10-CM | POA: Diagnosis not present

## 2021-06-05 DIAGNOSIS — R600 Localized edema: Secondary | ICD-10-CM

## 2021-06-05 DIAGNOSIS — R0609 Other forms of dyspnea: Secondary | ICD-10-CM | POA: Insufficient documentation

## 2021-06-05 DIAGNOSIS — N183 Chronic kidney disease, stage 3 unspecified: Secondary | ICD-10-CM

## 2021-06-05 DIAGNOSIS — I10 Essential (primary) hypertension: Secondary | ICD-10-CM | POA: Diagnosis not present

## 2021-06-05 DIAGNOSIS — R0602 Shortness of breath: Secondary | ICD-10-CM | POA: Diagnosis not present

## 2021-06-05 DIAGNOSIS — Z596 Low income: Secondary | ICD-10-CM

## 2021-06-05 DIAGNOSIS — S2231XA Fracture of one rib, right side, initial encounter for closed fracture: Secondary | ICD-10-CM | POA: Diagnosis not present

## 2021-06-05 LAB — BASIC METABOLIC PANEL
BUN: 17 mg/dL (ref 6–23)
CO2: 25 mEq/L (ref 19–32)
Calcium: 9 mg/dL (ref 8.4–10.5)
Chloride: 107 mEq/L (ref 96–112)
Creatinine, Ser: 0.85 mg/dL (ref 0.40–1.20)
GFR: 63.06 mL/min (ref >60.00)
Glucose, Bld: 64 mg/dL — ABNORMAL LOW (ref 70–99)
Potassium: 4.1 mEq/L (ref 3.5–5.1)
Sodium: 143 mEq/L (ref 135–145)

## 2021-06-05 NOTE — Patient Outreach (Signed)
[facility] [facility]) Care Management ? ?06/05/2021 ? ?Sherald Barge ?04/20/1935 ?[id number] ? ? ?RN Health Coach attempted follow up outreach call to patient.  Patient was unavailable. ? ? ?Plan: ?RN will call patient again within 30 days. ? ?Johny Shock BSN RN ?[place] Management ?[phone] ? ?

## 2021-06-05 NOTE — Progress Notes (Signed)
OFFICE VISIT  06/05/2021  CC:  Chief Complaint  Patient presents with   Edema    Patient is a 86 y.o. female who presents for 12-day follow-up bilateral lower extremity edema, L>R. A/P as of last visit: "1) Bilateral lower extremity edema.  She does have some lower extremity venous insufficiency. Unclear why her edema seems worse over the last several weeks. It is stable at this time on Lasix 40 mg a day alternating with 20 mg a day. Question contribution of amlodipine to her edema, although she has been on this medicine for quite a while. We will try discontinuing this medication and see how things go.  In the meantime, start spironolactone cautiously for blood pressure control.  We will do 25 mg a day, recheck bmet/office visit 1 week. Additionally, will check TSH, be met, and magnesium today. Of note, she has been seen by vein and vascular specialist clinic in [place] but does not want to return there. She is interested in seeing vascular for follow-up so I have referred her to vascular surgery with [facility].   #2 hypertension. Stop amlodipine and start spironolactone 25 mg qd as stated in #1 above.  3.  Hypothyroidism. Monitoring TSH today, especially in light of #1 above   #4 chronic renal insufficiency stage III.  GFR baseline around 45. Monitoring electrolytes and creatinine today."  INTERIM HX: TSH was elevated to 19.5 last visit-> new thyroid dosing since that time is 100 mcg once a day. Electrolytes and creatinine were normal.  Her swelling has gone down some.  Home blood pressures low 140s over 80s.  Heart rate 60s.  She saw Dr. Aleda Grana with vascular since last visit (05/29/21):  he was repeating venous Doppler on left leg and considering treatment with antibiotic for possible cellulitis of the left leg as well.  If no DVT then he will likely order abdomen/ pelvis CT contrast to look for anything compressing veins/lymphatics. He placed an Unna boot on her left leg.   Compression hose as well  She does note she has felt more dyspnea on exertion the last couple weeks.  No wheezing, no cough, no chest pain, no dizziness, no palpitations.  Intermittent right CVA pain brief and mild.  No urinary symptoms.  Past Medical History:  Diagnosis Date   Allergic rhinitis    with upper airway cough: as of 07/2017 pulm f/u she was instructed to use astelin, flonase, and saline more consistently   BPPV (benign paroxysmal positional vertigo) 03/2018   Chest pain, non-cardiac    Cardiac CT showed no signif obstructive dz, mildly elevated calcium level (Dr. [name]).   Chronic cystitis with hematuria    [name]   Chronic hypoxemic respiratory failure ([place]) 02/02/2015   2L oxygen 24/7   Chronic renal insufficiency, stage 2 (mild)    GFR approx 60   Cold hands    NCS/EMGs normal.  Hand dysesthesias per neuro--reassured   COPD (chronic obstructive pulmonary disease) ([place])    GOLD 4.  spirometry 01/10/09 FEV 0.97(52%), FEV1% 47.  With chronic bronchitis as of 07/2017.   DDD (degenerative disc disease), cervical    1998 MRI C5-6 impingemt (left).  MRI 2021 (neurol) 1 level canal stenosis, 1 level foraminal stenosis--->PT   DDD (degenerative disc disease), lumbar    Depression with anxiety 04/15/2011   Diarrheal disease Summer 2017   ? 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).  As of  10/05/15 pt reports GI dx'd her with C diff and rx'd more flagyl and she is also on cholestyramine.  As of 02/2016, pt's sx's resolved completely.   Diverticulosis of colon    Procto '97 and colonoscopy 2002   Dysplastic nevus of upper extremity 04/2014   R tricep ([name])   Edema of both lower extremities due to peripheral venous insufficiency    Varicosities bilat, swelling L>R.  R baker's cyst.  Got vein clinic eval 02/2018->sclerotherapy recommended but since no hemorrhage  or ulceration, insurance will not cover the procedure.   Family history of adverse reaction to anesthesia    mother died when patient age 49 receiving anesthesia for thyroid surgery   Fibrocystic breast disease    w/fibroadenoma.  Bx's showed NO atypia (Dr. Margot Chimes).  Mammo neg 2008.   GERD (gastroesophageal reflux disease)    History of double vision    Ophthalmologist, Dr. Herbert Deaner, is further evaluating this with MRI  orbits and limited brain (myesthenia gravis testing neg 07/2010)   History of home oxygen therapy    uses 2 liters at hs   Hypertension    EKG 03/2010 normal   Hypothyroidism    Hashimoto's, dx'd 1989   Idiopathic peripheral neuropathy 04/2018   Sensory (numb feet) S1 distribution bilat, c/w spinal stenosis sensory neuropathy (no pain). 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  Labs [place], MRI C spine->some spondylosis/stenosis.  UE NCS/EMS: normal 04/2019.   Lesion of right native kidney 2017   found on CT 02/2015.  F/u MRI 03/31/15 showed it to be smaller and less likely of any significance but repeat CT renal protocol in 90mo was recommended by radiology.  This was done 10/26/15 and showed no interval change in the lesion, but b/c the lesion enhances with contrast, radiol rec'd urol consult (b/c pap renal RCC could not be excluded).  Saw urol 03/06/16--stable on u/s 09/2016 and 09/2017.   OAB (overactive bladder)    Osteoarthritis, hip, bilateral    she is s/p bilat THA as of 02/2018.   Osteoporosis    radius, T score -2.5->rec'd alendronate 08/2019->plan rpt DEXA 08/2021.   Pneumonia    Postmenopausal atrophic vaginitis    improved with estrace 2 X/week.   Prolapse of vaginal cuff after hysterectomy    10/2018: Dr. Wayne Both. Mild colpocele 04/2019 per GYN (no surgery)   Pulmonary nodule    CT chest 12/10, June 2011.  No nodule on CT 06/2010.   Recurrent Clostridium difficile diarrhea    Episode 09/19/2016: resolved with  prolonged course of flagyl.  11/2016 recurrence treated with 10d of Dificid (Fidaxomicin) by GI (Dr. Earlean Shawl).   Recurrent UTI    likely related to pt's atrophic vaginitis.  Urol started vaginal estrace 03/2016.   Rib fracture 09/2018   R 7th, laterally-->sustained when her dog pulled her over and she fell.   RLS (restless legs syndrome)    neuro->gabapentin trial 2020/21   Shingles 02/2014   R abd/flank   Spinal stenosis of lumbar region with neurogenic claudication    2008 MRI (L3-4, L4-5, L5-S1).  +S1 distribution sensory loss bilat    Past Surgical History:  Procedure Laterality Date   ABDOMINAL HYSTERECTOMY  1978   no BSO per pt--nonmalignant reasons   APPENDECTOMY     BREAST CYST EXCISION     Last screening mammogram 10/2010 was normal.   CHOLECYSTECTOMY  2001   COLONOSCOPY  04/2005; 12/06/2015   2007 NORMAL.  2017 adenomatous  polyp x 1.  Mod sigmoid diverticulosis (Dr. Earlean Shawl).     DEXA  05/18/2017; 08/7019   2019 NORMAL (T-score 0.1).  08/2019->T score -2.5->alendronate started->plan Rpt 08/2021.   HIP CLOSED REDUCTION Right 12/25/2015   Procedure: CLOSED REDUCTION HIP;  Surgeon: Nicholes Stairs, MD;  Location: [facility];  Service: Orthopedics;  Laterality: Right;   NCV/EMS  10/2018   chronic and symmetric sensorimotor axonal polyneuropathy affecting the lower extremities   TONSILLECTOMY     TOTAL HIP ARTHROPLASTY Right 11/01/2014   Procedure: RIGHT TOTAL HIP ARTHROPLASTY;  Surgeon: Latanya Maudlin, MD;  Location: [facility];  Service: Orthopedics;  Laterality: Right;   TOTAL HIP ARTHROPLASTY Left 12/08/2017   Procedure: LEFT TOTAL HIP ARTHROPLASTY ANTERIOR APPROACH;  Surgeon: Paralee Cancel, MD;  Location: [facility];  Service: Orthopedics;  Laterality: Left;  70 mins   TRANSTHORACIC ECHOCARDIOGRAM  08/2014   EF 60-65%, grade I diast dysfxn   TUBAL LIGATION  1974    Outpatient Medications Prior to Visit  Medication Sig Dispense Refill   albuterol (PROAIR HFA) 108 (90 Base) MCG/ACT  inhaler Inhale 2 puffs into the lungs every 4 (four) hours as needed for wheezing or shortness of breath. 8.5 g 1   alendronate (FOSAMAX) 70 MG tablet TAKE 1 TABLET(70 MG) BY MOUTH 1 TIME A WEEK WITH A FULL GLASS OF WATER AND ON AN EMPTY STOMACH 12 tablet 3   Artificial Tear Solution (SOOTHE XP) SOLN Place 1 drop into both eyes 2 (two) times daily as needed (irritation).     aspirin EC 81 MG tablet Take 81 mg by mouth daily.     azelastine (ASTELIN) 0.1 % nasal spray Place 2 sprays into both nostrils 2 (two) times daily. Use in each nostril as directed 30 mL 3   cetirizine (ZYRTEC) 10 MG tablet Take 1 tablet (10 mg total) by mouth daily. 30 tablet 6   clobetasol (TEMOVATE) 0.05 % external solution APPLY TO THE AFFECTED AREA EVERY DAY 50 mL 4   cycloSPORINE (RESTASIS OP) Apply to eye.     diazepam (VALIUM) 5 MG tablet TAKE 1 TABLET BY MOUTH DAILY AS NEEDED FOR ANXIETY AND INSOMNIA 30 tablet 5   doxycycline (VIBRAMYCIN) 100 MG capsule Take 100 mg by mouth 2 (two) times daily.     fluorometholone (FML) 0.1 % ophthalmic suspension Place 1 drop into both eyes as directed. Tapering off     fluticasone (CUTIVATE) 0.05 % cream Apply topically 2 (two) times daily. 30 g 1   fluticasone (FLONASE) 50 MCG/ACT nasal spray Place 2 sprays into both nostrils daily. 16 g 6   Fluticasone-Umeclidin-Vilant (TRELEGY ELLIPTA) 100-62.5-25 MCG/ACT AEPB Inhale 1 puff into the lungs daily. 180 each 3   furosemide (LASIX) 20 MG tablet 2 tabs qd alt with 1 tab qd 90 tablet 1   levothyroxine (SYNTHROID) 100 MCG tablet Take 1 tablet (100 mcg total) by mouth daily. 30 tablet 2   Lifitegrast (XIIDRA) 5 % SOLN Apply to eye in the morning and at bedtime.     nitrofurantoin (MACRODANTIN) 50 MG capsule TAKE 1 CAPSULE(50 MG) BY MOUTH AT BEDTIME 90 capsule 3   Probiotic Product (PROBIOTIC DAILY PO) Take 1 capsule by mouth daily.      roflumilast (DALIRESP) 500 MCG TABS tablet Take 1 tablet (500 mcg total) by mouth daily. 90 tablet 3    spironolactone (ALDACTONE) 25 MG tablet Take 1 tablet (25 mg total) by mouth daily. 30 tablet 0   triamcinolone cream (KENALOG) 0.1 %  Apply 1 application topically 2 (two) times daily. 45 g 2   venlafaxine (EFFEXOR) 50 MG tablet Take 1 tablet (50 mg total) by mouth 2 (two) times daily. 180 tablet 3   Vibegron (GEMTESA) 75 MG TABS Take 1 capsule by mouth daily. 30 tablet 11   VISINE 0.05 % ophthalmic solution 1 drop  as needed     vitamin B-12 (CYANOCOBALAMIN) 100 MCG tablet Take 100 mcg by mouth daily.     VITAMIN D PO Take by mouth.     No facility-administered medications prior to visit.    Allergies  Allergen Reactions   Losartan Other (See Comments)    hyperkalemia   Penicillins Hives, Swelling and Rash    Has patient had a PCN reaction causing immediate rash, facial/tongue/throat swelling, SOB or lightheadedness with hypotension: YES Has patient had a PCN reaction causing severe rash involving mucus membranes or skin necrosis: NO Has patient had a PCN reaction that required hospitalization NO Has patient had a PCN reaction occurring within the last 10 years: NO If all of the above answers are "NO", then may proceed with Cephalosporin use.    Cefixime [Kdc:Cefixime] Other (See Comments)    unspecified   Gabapentin Other (See Comments)    Feels woozy   Indocin [Indomethacin] Other (See Comments)    Painful tongue and throat   Singulair [Montelukast Sodium]     Chest tightness, decreased mental clarity   Sulfa Antibiotics Other (See Comments)    unspecified   Ace Inhibitors Other (See Comments)    cough   Statins Other (See Comments)    Myalgias     ROS As per HPI  PE:    06/05/2021    8:57 AM 05/24/2021    1:27 PM 05/15/2021    4:06 PM  Vitals with BMI  Height $Remov'5\' 0"'DOjODe$  $Remove'5\' 0"'xAItYFZ$  $RemoveB'5\' 0"'ophRyyIj$   Weight 130 lbs 3 oz 130 lbs 3 oz 130 lbs  BMI 25.43 85.02 77.41  Systolic 287 867 672  Diastolic 81 73 70  Pulse 58 68 62   02 sat today on RA is 91%  Physical Exam  General: Alert  and well-appearing. Chest: Regular rhythm and rate without murmur. Lungs questionable bibasilar very soft inspiratory crackles--question atelectasis. Good aeration, nonlabored breathing. Extremities trace bilateral lower extremity pitting edema.  LABS:  Last CBC Lab Results  Component Value Date   WBC 7.6 04/29/2021   HGB 13.1 04/29/2021   HCT 39.9 04/29/2021   MCV 91.0 04/29/2021   MCH 28.4 12/10/2017   RDW 14.8 04/29/2021   PLT 251.0 09/47/0962   Last metabolic panel Lab Results  Component Value Date   GLUCOSE 76 05/24/2021   NA 142 05/24/2021   K 3.6 05/24/2021   CL 103 05/24/2021   CO2 26 05/24/2021   BUN 16 05/24/2021   CREATININE 0.92 05/24/2021   GFRNONAA >60 12/10/2017   CALCIUM 9.3 05/24/2021   PHOS 3.8 01/31/2013   PROT 6.3 12/07/2020   ALBUMIN 4.5 05/09/2020   BILITOT 0.5 12/07/2020   ALKPHOS 105 05/09/2020   AST 14 12/07/2020   ALT 10 12/07/2020   ANIONGAP 6 12/10/2017   Last lipids Lab Results  Component Value Date   CHOL 198 12/07/2020   HDL 66 12/07/2020   LDLCALC 113 (H) 12/07/2020   LDLDIRECT 131.3 03/07/2010   TRIG 88 12/07/2020   CHOLHDL 3.0 12/07/2020   Last hemoglobin A1c Lab Results  Component Value Date   HGBA1C 5.7 10/29/2011   Last thyroid functions  Lab Results  Component Value Date   TSH 19.49 (H) 05/24/2021   T3TOTAL 97.2 07/11/2010   T4TOTAL 12.4 07/11/2010   IMPRESSION AND PLAN:  #1 asymmetric bilateral lower extremity edema, left greater than right.  Improving. Unna boot left leg per vascular.  Compression hose.  Lasix 40 mg daily alternating with 20 mg daily. BMET today Dr. Renaldo Reel following.  #2 hypertension, not ideal control. Increase spironolactone to 25 mg twice daily.  #3 hypothyroidism, not well controlled. Just recently increase levothyroxine as noted in HPI. Next TSH 4-6 wks.  #4 dyspnea on exertion. She has underlying COPD.  We will check chest x-ray today and echocardiogram.  Return for pulmonary  follow-up if all this is okay.  An After Visit Summary was printed and given to the patient.  FOLLOW UP: Lab appointment for be met 1 week, follow-up with me in the office 2 weeks  Signed:  Crissie Sickles, MD           06/05/2021

## 2021-06-05 NOTE — Progress Notes (Signed)
[facility])  ?                                          [facility] Quality Pharmacy Team ?  ? ?06/05/2021 ? ?Sherald Barge ?Oct 25, 1935 ?[id number] ? ?                                    Medication Assistance Referral ? ?Referral From: [facility] RPh Colleen S. ? ?Medication/Company: Shirley Friar / [employer] ?Patient application portion:  Mailed ?Provider application portion: Faxed  to Dr. Arnoldo Hooker ?Provider address/fax verified via: Call to office ? ?Lavonn Maxcy P. Analaya Hoey, CPhT ?[place]  ?[phone] ? ?

## 2021-06-05 NOTE — Patient Instructions (Signed)
Increase your spironolactone to '25mg'$  TWICE a day. ? ?

## 2021-06-07 ENCOUNTER — Emergency Department (HOSPITAL_BASED_OUTPATIENT_CLINIC_OR_DEPARTMENT_OTHER): Payer: Medicare Other

## 2021-06-07 ENCOUNTER — Emergency Department (HOSPITAL_BASED_OUTPATIENT_CLINIC_OR_DEPARTMENT_OTHER)
Admission: EM | Admit: 2021-06-07 | Discharge: 2021-06-07 | Disposition: A | Discharge status: Home / Self Care | Payer: Medicare Other | Attending: Emergency Medicine | Admitting: Emergency Medicine

## 2021-06-07 ENCOUNTER — Telehealth: Payer: Self-pay

## 2021-06-07 ENCOUNTER — Encounter (HOSPITAL_BASED_OUTPATIENT_CLINIC_OR_DEPARTMENT_OTHER): Payer: Self-pay | Admitting: Emergency Medicine

## 2021-06-07 ENCOUNTER — Other Ambulatory Visit: Payer: Self-pay | Admitting: *Deleted

## 2021-06-07 ENCOUNTER — Other Ambulatory Visit: Payer: Self-pay

## 2021-06-07 DIAGNOSIS — I10 Essential (primary) hypertension: Secondary | ICD-10-CM | POA: Insufficient documentation

## 2021-06-07 DIAGNOSIS — R001 Bradycardia, unspecified: Secondary | ICD-10-CM | POA: Insufficient documentation

## 2021-06-07 DIAGNOSIS — E860 Dehydration: Secondary | ICD-10-CM

## 2021-06-07 DIAGNOSIS — Z7982 Long term (current) use of aspirin: Secondary | ICD-10-CM | POA: Diagnosis not present

## 2021-06-07 DIAGNOSIS — R0602 Shortness of breath: Secondary | ICD-10-CM | POA: Diagnosis not present

## 2021-06-07 DIAGNOSIS — Z20822 Contact with and (suspected) exposure to covid-19: Secondary | ICD-10-CM | POA: Diagnosis not present

## 2021-06-07 LAB — CBC WITH DIFFERENTIAL/PLATELET
Abs Immature Granulocytes: 0.02 10*3/uL (ref 0.00–0.07)
Basophils Absolute: 0 10*3/uL (ref 0.0–0.1)
Basophils Relative: 0 %
Eosinophils Absolute: 0.1 10*3/uL (ref 0.0–0.5)
Eosinophils Relative: 2 %
HCT: 40 % (ref 36.0–46.0)
Hemoglobin: 13 g/dL (ref 12.0–15.0)
Immature Granulocytes: 0 %
Lymphocytes Relative: 21 %
Lymphs Abs: 1.5 10*3/uL (ref 0.7–4.0)
MCH: 29.7 pg (ref 26.0–34.0)
MCHC: 32.5 g/dL (ref 30.0–36.0)
MCV: 91.5 fL (ref 80.0–100.0)
Monocytes Absolute: 0.8 10*3/uL (ref 0.1–1.0)
Monocytes Relative: 11 %
Neutro Abs: 4.5 10*3/uL (ref 1.7–7.7)
Neutrophils Relative %: 66 %
Platelets: 254 10*3/uL (ref 150–400)
RBC: 4.37 MIL/uL (ref 3.87–5.11)
RDW: 14 % (ref 11.5–15.5)
WBC: 6.9 10*3/uL (ref 4.0–10.5)
nRBC: 0 % (ref 0.0–0.2)

## 2021-06-07 LAB — BASIC METABOLIC PANEL
Anion gap: 12 (ref 5–15)
BUN: 18 mg/dL (ref 8–23)
CO2: 22 mmol/L (ref 22–32)
Calcium: 9.3 mg/dL (ref 8.9–10.3)
Chloride: 109 mmol/L (ref 98–111)
Creatinine, Ser: 1.16 mg/dL — ABNORMAL HIGH (ref 0.44–1.00)
GFR, Estimated: 46 mL/min — ABNORMAL LOW (ref >60)
Glucose, Bld: 94 mg/dL (ref 70–99)
Potassium: 3.8 mmol/L (ref 3.5–5.1)
Sodium: 143 mmol/L (ref 135–145)

## 2021-06-07 LAB — RESP PANEL BY RT-PCR (FLU A&B, COVID) ARPGX2
Influenza A by PCR: NEGATIVE
Influenza B by PCR: NEGATIVE
SARS Coronavirus 2 by RT PCR: NEGATIVE

## 2021-06-07 NOTE — Patient Outreach (Signed)
[facility]) Care Management ? ?06/07/2021 ? ?Sherald Barge ?02/02/1936 ?[id number] ? ? ?[phone] [place] telephone call to patient.  [employer] compliance verified. ?Patient is at the fire dept. She stated that she is having chest pain. They are monitoring her vitals she said they told her she has an irregular heart beat.  ? ?Plan: ?Patient will go to Urgent care ?Rn will follow up outreach on Monday Jun 10, 2021 ? ?Johny Shock BSN RN ?[place] Management ?[phone] ? ?

## 2021-06-07 NOTE — Telephone Encounter (Signed)
Attempted to call cardio department [phone]) to try and schedule pt for ECG. Was unsuccessful at this time. Spoke wit pt and she is on her way to ED as advised by [facility]. ?

## 2021-06-07 NOTE — Telephone Encounter (Signed)
[facility] ? ?Heart rate skipping every 3-8 beats ? ?124/80 p 80 ? ?Pt states she has been SOB more recently. Concerned that Sx might be because of the changes in medication. Advised pt to call [place] [facility] ([phone]) to get scheduled for ECG.  ?

## 2021-06-07 NOTE — ED Provider Notes (Signed)
?[facility] DEPART[facility] ?Provider Note ? ? ?CSN: [id number] ?Arrival date & time: 06/07/21  1328 ? ?  ? ?History ? ?Chief Complaint  ?Patient presents with  ? Shortness of Breath  ? ? ?Priscilla Cantrell is a 86 y.o. female.  She is here with a complaint of shortness of breath elevated blood pressure and palpitations.  She said she was short of breath this morning.  She sometimes uses home oxygen when she is short of breath.  She used this and was better.  Then she checked her blood pressure and found it to be elevated.  She has had her blood pressure medications changed by her PCP recently.  She went to the fire station to make sure her blood pressure was elevated on their machine to and they thought her heart rate was irregular.  They recommended she be seen in the emergency department.  She denies any shortness of breath currently no palpitations no chest pain no abdominal pain vomiting fevers chills.  She has a chronic cough.  She has had some leg swelling recently and sees a vein specialist and is on antibiotics for that. ? ?The history is provided by the patient.  ?Shortness of Breath ?Severity:  Moderate ?Onset quality:  Gradual ?Timing:  Sporadic ?Progression:  Resolved ?Chronicity:  Recurrent ?Context: activity   ?Relieved by:  Oxygen ?Worsened by:  Activity ?Ineffective treatments:  None tried ?Associated symptoms: cough   ?Associated symptoms: no abdominal pain, no chest pain, no fever, no headaches, no hemoptysis, no rash, no sore throat, no sputum production and no vomiting   ?Risk factors: no tobacco use   ? ?  ? ?Home Medications ?Prior to Admission medications   ?Medication Sig Start Date End Date Taking? Authorizing Provider  ?albuterol (PROAIR HFA) 108 (90 Base) MCG/ACT inhaler Inhale 2 puffs into the lungs every 4 (four) hours as needed for wheezing or shortness of breath. 05/22/20   McGowen, Adrian Blackwater, MD  ?alendronate (FOSAMAX) 70 MG tablet TAKE 1 TABLET(70 MG) BY MOUTH 1 TIME A  WEEK WITH A FULL GLASS OF WATER AND ON AN EMPTY STOMACH 09/10/20   McGowen, Adrian Blackwater, MD  ?Artificial Tear Solution (SOOTHE XP) SOLN Place 1 drop into both eyes 2 (two) times daily as needed (irritation).    [provider]  ?aspirin EC 81 MG tablet Take 81 mg by mouth daily.    [provider]  ?azelastine (ASTELIN) 0.1 % nasal spray Place 2 sprays into both nostrils 2 (two) times daily. Use in each nostril as directed 05/22/20   McGowen, Adrian Blackwater, MD  ?cetirizine (ZYRTEC) 10 MG tablet Take 1 tablet (10 mg total) by mouth daily. 05/22/20   McGowen, Adrian Blackwater, MD  ?clobetasol (TEMOVATE) 0.05 % external solution APPLY TO THE AFFECTED AREA EVERY DAY 03/08/20   Sheffield, Vida Roller R, PA-C  ?cycloSPORINE (RESTASIS OP) Apply to eye.    [provider]  ?diazepam (VALIUM) 5 MG tablet TAKE 1 TABLET BY MOUTH DAILY AS NEEDED FOR ANXIETY AND INSOMNIA 11/21/20   McGowen, Adrian Blackwater, MD  ?doxycycline (VIBRAMYCIN) 100 MG capsule Take 100 mg by mouth 2 (two) times daily. 05/29/21   [provider]  ?fluorometholone (FML) 0.1 % ophthalmic suspension Place 1 drop into both eyes as directed. Tapering off    [provider]  ?fluticasone (CUTIVATE) 0.05 % cream Apply topically 2 (two) times daily. 05/15/21   McGowen, Adrian Blackwater, MD  ?fluticasone (FLONASE) 50 MCG/ACT nasal spray Place 2 sprays  into both nostrils daily. 05/22/20   McGowen, Adrian Blackwater, MD  ?Fluticasone-Umeclidin-Vilant (TRELEGY ELLIPTA) 100-62.5-25 MCG/ACT AEPB Inhale 1 puff into the lungs daily. 04/10/21   Chesley Mires, MD  ?furosemide (LASIX) 20 MG tablet 2 tabs qd alt with 1 tab qd 05/06/21   McGowen, Adrian Blackwater, MD  ?levothyroxine (SYNTHROID) 100 MCG tablet Take 1 tablet (100 mcg total) by mouth daily. 05/29/21   McGowen, Adrian Blackwater, MD  ?Lifitegrast Shirley Friar) 5 % SOLN Apply to eye in the morning and at bedtime.    [provider]  ?nitrofurantoin (MACRODANTIN) 50 MG capsule TAKE 1 CAPSULE(50 MG) BY MOUTH AT BEDTIME 04/16/21   McKenzie,  [name], MD  ?Probiotic Product (PROBIOTIC DAILY PO) Take 1 capsule by mouth daily.     [provider]  ?roflumilast (DALIRESP) 500 MCG TABS tablet Take 1 tablet (500 mcg total) by mouth daily. 05/31/20   Martyn Ehrich, NP  ?spironolactone (ALDACTONE) 25 MG tablet Take 1 tablet (25 mg total) by mouth daily. 05/24/21   McGowen, Adrian Blackwater, MD  ?triamcinolone cream (KENALOG) 0.1 % Apply 1 application topically 2 (two) times daily. 08/17/20   Kuneff, Renee A, DO  ?venlafaxine (EFFEXOR) 50 MG tablet Take 1 tablet (50 mg total) by mouth 2 (two) times daily. 03/03/21   McGowen, Adrian Blackwater, MD  ?Vibegron (GEMTESA) 75 MG TABS Take 1 capsule by mouth daily. 11/23/20   McKenzie, [name], MD  ?VISINE 0.05 % ophthalmic solution 1 drop  as needed 12/14/19   [provider]  ?vitamin B-12 (CYANOCOBALAMIN) 100 MCG tablet Take 100 mcg by mouth daily.    [provider]  ?VITAMIN D PO Take by mouth.    [provider]  ?   ? ?Allergies    ?Losartan, Penicillins, Cefixime [kdc:cefixime], Gabapentin, Indocin [indomethacin], Singulair [montelukast sodium], Sulfa antibiotics, Ace inhibitors, and Statins   ? ?Review of Systems   ?Review of Systems  ?Constitutional:  Negative for fever.  ?HENT:  Negative for sore throat.   ?Eyes:  Negative for visual disturbance.  ?Respiratory:  Positive for cough and shortness of breath. Negative for hemoptysis and sputum production.   ?Cardiovascular:  Negative for chest pain.  ?Gastrointestinal:  Negative for abdominal pain and vomiting.  ?Genitourinary:  Negative for dysuria.  ?Skin:  Negative for rash.  ?Neurological:  Negative for headaches.  ? ?Physical Exam ?Updated Vital Signs ?BP (!) 154/79 (BP Location: Left Arm)   Pulse (!) 54   Temp 97.8 ?F (36.6 ?C)   Resp 18   SpO2 96%  ?Physical Exam ?Vitals and nursing note reviewed.  ?Constitutional:   ?   General: She is not in acute distress. ?   Appearance: She is well-developed.  ?HENT:  ?   Head:  Normocephalic and atraumatic.  ?Eyes:  ?   Conjunctiva/sclera: Conjunctivae normal.  ?Cardiovascular:  ?   Rate and Rhythm: Regular rhythm. Bradycardia present.  ?   Heart sounds: No murmur heard. ?Pulmonary:  ?   Effort: Pulmonary effort is normal. No respiratory distress.  ?   Breath sounds: Normal breath sounds.  ?Abdominal:  ?   Palpations: Abdomen is soft.  ?   Tenderness: There is no abdominal tenderness.  ?Musculoskeletal:     ?   General: No swelling.  ?   Cervical back: Neck supple.  ?   Right lower leg: No tenderness.  ?   Left lower leg: No tenderness.  ?Skin: ?   General: Skin is warm and dry.  ?  Capillary Refill: Capillary refill takes less than 2 seconds.  ?Neurological:  ?   General: No focal deficit present.  ?   Mental Status: She is alert.  ? ? ?ED Results / Procedures / Treatments   ?Labs ?(all labs ordered are listed, but only abnormal results are displayed) ?Labs Reviewed  ?BASIC METABOLIC PANEL - Abnormal; Notable for the following components:  ?    Result Value  ? Creatinine, Ser 1.16 (*)   ? GFR, Estimated 46 (*)   ? All other components within normal limits  ?RESP PANEL BY RT-PCR (FLU A&B, COVID) [id number]  ?CBC WITH DIFFERENTIAL/PLATELET  ? ? ?EKG ?EKG Interpretation ? ?Date/Time:  Friday Jun 07 2021 13:48:24 EDT ?Ventricular Rate:  57 ?PR Interval:  168 ?QRS Duration: 82 ?QT Interval:  418 ?QTC Calculation: 406 ?R Axis:   -11 ?Text Interpretation: Sinus bradycardia Otherwise normal ECG When compared with ECG of 10-May-2017 23:26, No significant change since last tracing Confirmed by Aletta Edouard [phone]) on 06/07/2021 3:01:29 PM ? ?Radiology ?DG Chest Portable 1 View ? ?Result Date: 06/07/2021 ?CLINICAL DATA:  Shortness of breath EXAM: PORTABLE CHEST 1 VIEW COMPARISON:  Chest x-ray dated Jun 05, 2021 FINDINGS: Cardiac and mediastinal contours are unchanged when accounting for AP technique. Mild bibasilar opacities, likely due to atelectasis. No focal consolidation. No large pleural effusion or  pneumothorax. Focal thickening of the right chest wall, unchanged when compared with prior exam and likely due to prior rib fracture. IMPRESSION: No active disease. Electronically Signed   By: Yetta Glassman M.D.   On: 0

## 2021-06-07 NOTE — Telephone Encounter (Signed)
FYI  Please see below

## 2021-06-07 NOTE — Discharge Instructions (Signed)
You were seen in the emergency department for some shortness of breath, irregular heartbeat, and elevated blood pressure.  Your chest x-ray COVID and flu testing were unremarkable.  Your blood counts showed you to be slightly dehydrated.  Please continue your regular medications and keep well-hydrated.  Follow-up with your regular doctor as scheduled for blood work and reassessment.  Return if any concerns. ?

## 2021-06-07 NOTE — ED Triage Notes (Signed)
Progressive shortness of breath x 1 week , Hx copd . Denies chest pain . Xray last week , she sts no new lung illness.  ?Reports checked her pulse at the fire station and was told she was skipping beats .  ?

## 2021-06-10 ENCOUNTER — Other Ambulatory Visit: Payer: Self-pay | Admitting: Family Medicine

## 2021-06-10 DIAGNOSIS — I871 Compression of vein: Secondary | ICD-10-CM

## 2021-06-12 ENCOUNTER — Ambulatory Visit: Payer: Medicare Other

## 2021-06-13 ENCOUNTER — Ambulatory Visit (HOSPITAL_BASED_OUTPATIENT_CLINIC_OR_DEPARTMENT_OTHER): Admission: RE | Admit: 2021-06-13 | Payer: Medicare Other | Source: Ambulatory Visit

## 2021-06-14 ENCOUNTER — Other Ambulatory Visit: Payer: Self-pay | Admitting: *Deleted

## 2021-06-14 ENCOUNTER — Ambulatory Visit (INDEPENDENT_AMBULATORY_CARE_PROVIDER_SITE_OTHER): Payer: Medicare Other

## 2021-06-14 DIAGNOSIS — I10 Essential (primary) hypertension: Secondary | ICD-10-CM | POA: Diagnosis not present

## 2021-06-14 LAB — BASIC METABOLIC PANEL
BUN: 18 mg/dL (ref 6–23)
CO2: 26 mEq/L (ref 19–32)
Calcium: 9.6 mg/dL (ref 8.4–10.5)
Chloride: 107 mEq/L (ref 96–112)
Creatinine, Ser: 1 mg/dL (ref 0.40–1.20)
GFR: 51.88 mL/min — ABNORMAL LOW (ref >60.00)
Glucose, Bld: 111 mg/dL — ABNORMAL HIGH (ref 70–99)
Potassium: 4.1 mEq/L (ref 3.5–5.1)
Sodium: 143 mEq/L (ref 135–145)

## 2021-06-14 NOTE — Patient Outreach (Signed)
[place] [facility]) Care Management ? ?06/14/2021 ? ?Sherald Barge ?[birth year] ?[id number] ? ? ?RN Health Coach telephone call to patient.  [employer] compliance verified. Patient was leaving to have blood work drawn. ? ?Plan: ?Patient will call RN Health Coach back when she returns ? ? ?Johny Shock BSN RN ?[place] Management ?[phone] ? ? ?

## 2021-06-17 ENCOUNTER — Other Ambulatory Visit: Payer: Self-pay | Admitting: *Deleted

## 2021-06-17 DIAGNOSIS — S70362A Insect bite (nonvenomous), left thigh, initial encounter: Secondary | ICD-10-CM | POA: Diagnosis not present

## 2021-06-17 NOTE — Patient Outreach (Signed)
[place] [facility]) Care Management ? ?06/17/2021 ? ?Sherald Barge ?28-Oct-1935 ?[id number] ? ? ?RN Health Coach telephone call to patient.  [employer] compliance verified. ?RN following up since patient has missed her Echocardiogram.Per patient she forgot to go until Friday. RN discussed with patient on calling to reschedule. Patient called and rescheduled while nurse was on line. Patient also went to urgent care due to have a tick head removed.  ? ?Johny Shock BSN RN ?[place] Management ?[phone] ? ?

## 2021-06-19 ENCOUNTER — Ambulatory Visit (INDEPENDENT_AMBULATORY_CARE_PROVIDER_SITE_OTHER): Payer: Medicare Other | Admitting: Family Medicine

## 2021-06-19 ENCOUNTER — Encounter: Payer: Self-pay | Admitting: Family Medicine

## 2021-06-19 VITALS — BP 133/73 | HR 65 | Temp 97.6°F | Ht 60.0 in | Wt 127.8 lb

## 2021-06-19 DIAGNOSIS — R197 Diarrhea, unspecified: Secondary | ICD-10-CM

## 2021-06-19 DIAGNOSIS — Z8619 Personal history of other infectious and parasitic diseases: Secondary | ICD-10-CM

## 2021-06-19 NOTE — Patient Instructions (Signed)
Increase your spironolactone to 1 and 1/2 tabs twice per day ?

## 2021-06-19 NOTE — Progress Notes (Signed)
OFFICE VISIT  06/19/2021  CC:  Chief Complaint  Patient presents with   Edema   Hypertension    BP has been running systolic 062 for 3 nights in a row with heart rate in the 50's.     Patient is a 86 y.o. female who presents for 2 wk f/u bilat LE edema as well as f/u HTN.   INTERIM HX: At her last visit with me on 06/05/21 I increased her spironolactone to '25mg'$  bid. CXR showed no acute abnormality.  I also ordered and echo and this is scheduled for 06/21/21.  She went to the ED on 06/07/21--> patient states she felt slightly lightheaded, checked her blood pressure at home and it was 376 systolic.  She went to the nearby fire station and blood pressure was rechecked and it was similar. She was told that she may have a "missed beat and it was recommended she seek care in the ED.  She denies having any palpitations. In the ED she was monitored and apparently all was well and she was discharged home without any medications changes. Since going home her blood pressures have been 283-151 systolic still.  She has had no further dizziness.  She is also concerned that for over the last few weeks she has been having multiple loose bowel movements per day and sometimes in the night.  Hard to tell if she is describing fecal leakage/overflow or if she is having actual episodes of diarrhea.  No recent change in lower extremity swelling.  ROS as above, plus--> no fevers, no CP, no SOB, no wheezing, no cough,  no HAs, no rashes, no melena/hematochezia.  No polyuria or polydipsia.  No myalgias or arthralgias.  No focal weakness, paresthesias, or tremors.  No acute vision or hearing abnormalities.  No dysuria or unusual/new urinary urgency or frequency.  No n/v/d or abd pain.      Past Medical History:  Diagnosis Date   Allergic rhinitis    with upper airway cough: as of 07/2017 pulm f/u she was instructed to use astelin, flonase, and saline more consistently   BPPV (benign paroxysmal positional vertigo)  03/2018   Chest pain, non-cardiac    Cardiac CT showed no signif obstructive dz, mildly elevated calcium level (Dr. [name]).   Chronic cystitis with hematuria    [name]   Chronic hypoxemic respiratory failure ([place]) 02/02/2015   2L oxygen 24/7   Chronic renal insufficiency, stage 2 (mild)    GFR approx 60   Cold hands    NCS/EMGs normal.  Hand dysesthesias per neuro--reassured   COPD (chronic obstructive pulmonary disease) ([place])    GOLD 4.  spirometry 01/10/09 FEV 0.97(52%), FEV1% 47.  With chronic bronchitis as of 07/2017.   DDD (degenerative disc disease), cervical    1998 MRI C5-6 impingemt (left).  MRI 2021 (neurol) 1 level canal stenosis, 1 level foraminal stenosis--->PT   DDD (degenerative disc disease), lumbar    Depression with anxiety 04/15/2011   Diarrheal disease Summer 2017   ? 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).  As of 10/05/15 pt reports GI dx'd her with C diff and rx'd more flagyl and she is also on cholestyramine.  As of 02/2016, pt's sx's resolved completely.   Diverticulosis of colon    Procto '97 and colonoscopy 2002   Dysplastic nevus of upper extremity 04/2014   R tricep ([name])   Edema of both  lower extremities due to peripheral venous insufficiency    Varicosities bilat, swelling L>R.  R baker's cyst.  Got vein clinic eval 02/2018->sclerotherapy recommended but since no hemorrhage or ulceration, insurance will not cover the procedure.   Family history of adverse reaction to anesthesia    mother died when patient age 48 receiving anesthesia for thyroid surgery   Fibrocystic breast disease    w/fibroadenoma.  Bx's showed NO atypia (Dr. Margot Chimes).  Mammo neg 2008.   GERD (gastroesophageal reflux disease)    History of double vision    Ophthalmologist, Dr. Herbert Deaner, is further evaluating this with MRI  orbits and limited brain (myesthenia gravis  testing neg 07/2010)   History of home oxygen therapy    uses 2 liters at hs   Hypertension    EKG 03/2010 normal   Hypothyroidism    Hashimoto's, dx'd 1989   Idiopathic peripheral neuropathy 04/2018   Sensory (numb feet) S1 distribution bilat, c/w spinal stenosis sensory neuropathy (no pain). 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  Labs [place], MRI C spine->some spondylosis/stenosis.  UE NCS/EMS: normal 04/2019.   Lesion of right native kidney 2017   found on CT 02/2015.  F/u MRI 03/31/15 showed it to be smaller and less likely of any significance but repeat CT renal protocol in [place] recommended by radiology.  This was done 10/26/15 and showed no interval change in the lesion, but b/c the lesion enhances with contrast, radiol rec'd urol consult (b/c pap renal RCC could not be excluded).  Saw urol 03/06/16--stable on u/s 09/2016 and 09/2017.   OAB (overactive bladder)    Osteoarthritis, hip, bilateral    she is s/p bilat THA as of 02/2018.   Osteoporosis    radius, T score -2.5->rec'd alendronate 08/2019->plan rpt DEXA 08/2021.   Pneumonia    Postmenopausal atrophic vaginitis    improved with estrace 2 X/week.   Prolapse of vaginal cuff after hysterectomy    10/2018: Dr. FWayne Both Mild colpocele 04/2019 per GYN (no surgery)   Pulmonary nodule    CT chest 12/10, June 2011.  No nodule on CT 06/2010.   Recurrent Clostridium difficile diarrhea    Episode 09/19/2016: resolved with prolonged course of flagyl.  11/2016 recurrence treated with 10d of Dificid (Fidaxomicin) by GI (Dr. MEarlean Shawl.   Recurrent UTI    likely related to pt's atrophic vaginitis.  Urol started vaginal estrace 03/2016.   Rib fracture 09/2018   R 7th, laterally-->sustained when her dog pulled her over and she fell.   RLS (restless legs syndrome)    neuro->gabapentin trial 2020/21   Shingles 02/2014   R abd/flank   Spinal stenosis of lumbar region with neurogenic  claudication    2008 MRI (L3-4, L4-5, L5-S1).  +S1 distribution sensory loss bilat    Past Surgical History:  Procedure Laterality Date   ABDOMINAL HYSTERECTOMY  1978   no BSO per pt--nonmalignant reasons   APPENDECTOMY     BREAST CYST EXCISION     Last screening mammogram 10/2010 was normal.   CHOLECYSTECTOMY  2001   COLONOSCOPY  04/2005; 12/06/2015   2007 NORMAL.  2017 adenomatous polyp x 1.  Mod sigmoid diverticulosis (Dr. MEarlean Shawl.     DEXA  05/18/2017; 08/7019   2019 NORMAL (T-score 0.1).  08/2019->T score -2.5->alendronate started->plan Rpt 08/2021.   HIP CLOSED REDUCTION Right 12/25/2015   Procedure: CLOSED REDUCTION HIP;  Surgeon: JNicholes Stairs MD;  Location: [facility];  Service: Orthopedics;  Laterality: Right;  NCV/EMS  10/2018   chronic and symmetric sensorimotor axonal polyneuropathy affecting the lower extremities   TONSILLECTOMY     TOTAL HIP ARTHROPLASTY Right 11/01/2014   Procedure: RIGHT TOTAL HIP ARTHROPLASTY;  Surgeon: Latanya Maudlin, MD;  Location: [facility];  Service: Orthopedics;  Laterality: Right;   TOTAL HIP ARTHROPLASTY Left 12/08/2017   Procedure: LEFT TOTAL HIP ARTHROPLASTY ANTERIOR APPROACH;  Surgeon: Paralee Cancel, MD;  Location: [facility];  Service: Orthopedics;  Laterality: Left;  70 mins   TRANSTHORACIC ECHOCARDIOGRAM  08/2014   EF 60-65%, grade I diast dysfxn   TUBAL LIGATION  1974    Outpatient Medications Prior to Visit  Medication Sig Dispense Refill   albuterol (PROAIR HFA) 108 (90 Base) MCG/ACT inhaler Inhale 2 puffs into the lungs every 4 (four) hours as needed for wheezing or shortness of breath. 8.5 g 1   alendronate (FOSAMAX) 70 MG tablet TAKE 1 TABLET(70 MG) BY MOUTH 1 TIME A WEEK WITH A FULL GLASS OF WATER AND ON AN EMPTY STOMACH 12 tablet 3   Artificial Tear Solution (SOOTHE XP) SOLN Place 1 drop into both eyes 2 (two) times daily as needed (irritation).     aspirin EC 81 MG tablet Take 81 mg by mouth daily.     azelastine (ASTELIN) 0.1 % nasal  spray Place 2 sprays into both nostrils 2 (two) times daily. Use in each nostril as directed 30 mL 3   cetirizine (ZYRTEC) 10 MG tablet Take 1 tablet (10 mg total) by mouth daily. 30 tablet 6   clobetasol (TEMOVATE) 0.05 % external solution APPLY TO THE AFFECTED AREA EVERY DAY 50 mL 4   cycloSPORINE (RESTASIS OP) Apply to eye.     diazepam (VALIUM) 5 MG tablet TAKE 1 TABLET BY MOUTH DAILY AS NEEDED FOR ANXIETY AND INSOMNIA 30 tablet 5   doxycycline (VIBRAMYCIN) 100 MG capsule Take 100 mg by mouth 2 (two) times daily.     fluticasone (CUTIVATE) 0.05 % cream Apply topically 2 (two) times daily. 30 g 1   fluticasone (FLONASE) 50 MCG/ACT nasal spray Place 2 sprays into both nostrils daily. 16 g 6   Fluticasone-Umeclidin-Vilant (TRELEGY ELLIPTA) 100-62.5-25 MCG/ACT AEPB Inhale 1 puff into the lungs daily. 180 each 3   furosemide (LASIX) 20 MG tablet 2 tabs qd alt with 1 tab qd 90 tablet 1   levothyroxine (SYNTHROID) 100 MCG tablet Take 1 tablet (100 mcg total) by mouth daily. 30 tablet 2   Lifitegrast (XIIDRA) 5 % SOLN Apply to eye in the morning and at bedtime.     nitrofurantoin (MACRODANTIN) 50 MG capsule TAKE 1 CAPSULE(50 MG) BY MOUTH AT BEDTIME 90 capsule 3   Probiotic Product (PROBIOTIC DAILY PO) Take 1 capsule by mouth daily.      roflumilast (DALIRESP) 500 MCG TABS tablet Take 1 tablet (500 mcg total) by mouth daily. 90 tablet 3   spironolactone (ALDACTONE) 25 MG tablet Take 1 tablet (25 mg total) by mouth daily. 30 tablet 0   triamcinolone cream (KENALOG) 0.1 % Apply 1 application topically 2 (two) times daily. 45 g 2   venlafaxine (EFFEXOR) 50 MG tablet Take 1 tablet (50 mg total) by mouth 2 (two) times daily. 180 tablet 3   Vibegron (GEMTESA) 75 MG TABS Take 1 capsule by mouth daily. 30 tablet 11   VISINE 0.05 % ophthalmic solution 1 drop  as needed     vitamin B-12 (CYANOCOBALAMIN) 100 MCG tablet Take 100 mcg by mouth daily.     VITAMIN  D PO Take by mouth.     fluorometholone (FML) 0.1 %  ophthalmic suspension Place 1 drop into both eyes as directed. Tapering off (Patient not taking: Reported on 06/19/2021)     No facility-administered medications prior to visit.    Allergies  Allergen Reactions   Losartan Other (See Comments)    hyperkalemia   Penicillins Hives, Swelling and Rash    Has patient had a PCN reaction causing immediate rash, facial/tongue/throat swelling, SOB or lightheadedness with hypotension: YES Has patient had a PCN reaction causing severe rash involving mucus membranes or skin necrosis: NO Has patient had a PCN reaction that required hospitalization NO Has patient had a PCN reaction occurring within the last 10 years: NO If all of the above answers are "NO", then may proceed with Cephalosporin use.    Cefixime [Kdc:Cefixime] Other (See Comments)    unspecified   Gabapentin Other (See Comments)    Feels woozy   Indocin [Indomethacin] Other (See Comments)    Painful tongue and throat   Singulair [Montelukast Sodium]     Chest tightness, decreased mental clarity   Sulfa Antibiotics Other (See Comments)    unspecified   Ace Inhibitors Other (See Comments)    cough   Statins Other (See Comments)    Myalgias     ROS As per HPI  PE:    06/19/2021    3:07 PM 06/07/2021    4:30 PM 06/07/2021    4:16 PM  Vitals with BMI  Height '5\' 0"'$     Weight 127 lbs 13 oz    BMI 40.98    Systolic 119 147 829  Diastolic 73 67 77  Pulse 65 63 59     Physical Exam  General: Alert and well-appearing. Affect is pleasant, lucid thought and speech. Cardiovascular: Regular rhythm and rate without murmur. Lungs clear to auscultation bilaterally, nonlabored respirations. Abdomen: Soft, nontender nondistended. Remedies: Trace to 1+ bilateral lower extremity edema.  LABS:  Last CBC Lab Results  Component Value Date   WBC 6.9 06/07/2021   HGB 13.0 06/07/2021   HCT 40.0 06/07/2021   MCV 91.5 06/07/2021   MCH 29.7 06/07/2021   RDW 14.0 06/07/2021   PLT 254  06/07/2021   Lab Results  Component Value Date   TSH 19.49 (H) 56/21/3086   Last metabolic panel Lab Results  Component Value Date   GLUCOSE 111 (H) 06/14/2021   NA 143 06/14/2021   K 4.1 06/14/2021   CL 107 06/14/2021   CO2 26 06/14/2021   BUN 18 06/14/2021   CREATININE 1.00 06/14/2021   GFRNONAA 46 (L) 06/07/2021   CALCIUM 9.6 06/14/2021   PHOS 3.8 01/31/2013   PROT 6.3 12/07/2020   ALBUMIN 4.5 05/09/2020   BILITOT 0.5 12/07/2020   ALKPHOS 105 05/09/2020   AST 14 12/07/2020   ALT 10 12/07/2020   ANIONGAP 12 06/07/2021   IMPRESSION AND PLAN:  #1 hypertension, not ideal control. Increase spironolactone to 1-1/2 25 mg tabs twice daily. Of note, I recently discontinued her amlodipine because I felt it may be contributing to her lower extremity edema.  #2 bilateral lower extremity edema.  She does have underlying chronic venous insufficiency.  Left side has been worse than the right.  She is followed by Dr. Renaldo Reel, vascular MD. Legs appear pretty good today, no change in Lasix: 40 mg every morning and 20 every afternoon.  #3 hypothyroidism, poor control. This could be contributing to her lower extremity edema as well. Dose of  levothyroxine was recently increased appropriately.  Plan recheck TSH in 2 weeks.  #4 diarrhea, waxing and waning.  She has some fecal soiling/leakage.  Pelvic floor incompetence. History of recurrent C. difficile. Obtain stool sample for C. difficile PCR. Referral to GI ordered today.  Recheck TSH and bmet 2 wks  An After Visit Summary was printed and given to the patient.  FOLLOW UP: No follow-ups on file.  Signed:  Crissie Sickles, MD           06/19/2021

## 2021-06-21 ENCOUNTER — Other Ambulatory Visit: Payer: Self-pay

## 2021-06-21 ENCOUNTER — Ambulatory Visit (HOSPITAL_BASED_OUTPATIENT_CLINIC_OR_DEPARTMENT_OTHER)
Admission: RE | Admit: 2021-06-21 | Discharge: 2021-06-21 | Disposition: A | Discharge status: Home / Self Care | Payer: Medicare Other | Source: Ambulatory Visit | Attending: Family Medicine | Admitting: Family Medicine

## 2021-06-21 DIAGNOSIS — R0609 Other forms of dyspnea: Secondary | ICD-10-CM | POA: Diagnosis not present

## 2021-06-21 MED ORDER — SPIRONOLACTONE 25 MG PO TABS: 25.0000 mg | ORAL_TABLET | Freq: Every day | ORAL | 0 refills | Status: DC

## 2021-06-21 NOTE — Progress Notes (Signed)
  Echocardiogram 2D Echocardiogram has been performed.  Merrie Roof F 06/21/2021, 9:46 AM

## 2021-06-22 LAB — ECHOCARDIOGRAM COMPLETE
Area-P 1/2: 3.94 cm2
S' Lateral: 2.8 cm

## 2021-06-24 ENCOUNTER — Telehealth: Payer: Self-pay

## 2021-06-24 ENCOUNTER — Encounter: Payer: Self-pay | Admitting: Family Medicine

## 2021-06-24 NOTE — Telephone Encounter (Signed)
Patient returning Priscilla Cantrell's call about results.  Please call back whenever available.

## 2021-06-24 NOTE — Telephone Encounter (Signed)
Pt informed of results. See result note

## 2021-06-25 ENCOUNTER — Telehealth: Payer: Self-pay | Admitting: Pharmacy Technician

## 2021-06-25 DIAGNOSIS — Z596 Low income: Secondary | ICD-10-CM

## 2021-06-25 NOTE — Progress Notes (Signed)
[place] [facility])                                            [place] Team    06/25/2021  MAGHAN JESSEE 1936/01/04 [id number]  Received both patient and provider portion(s) of patient assistance application(s) for Xiidra. Faxed completed application and required documents into [employer].  Roma Bierlein P. Honestii Marton, [place]  [phone]

## 2021-06-27 ENCOUNTER — Telehealth: Payer: Self-pay | Admitting: Pharmacy Technician

## 2021-06-27 DIAGNOSIS — R197 Diarrhea, unspecified: Secondary | ICD-10-CM | POA: Diagnosis not present

## 2021-06-27 DIAGNOSIS — Z596 Low income: Secondary | ICD-10-CM

## 2021-06-27 DIAGNOSIS — Z8619 Personal history of other infectious and parasitic diseases: Secondary | ICD-10-CM | POA: Diagnosis not present

## 2021-06-27 NOTE — Progress Notes (Signed)
Priscilla Cantrell)                                            [place] Team    06/27/2021  Priscilla Cantrell 1935/09/14 [id number]  Care coordination call placed to [employer] in regard to [facility] application.  Spoke to [employer] who informs patient has been APPROVED 06/27/21-02/02/22. She informs Priscilla Cantrell is delivered as 30 days supply. She informs patient should received the medication in the next 15-20 business days to her home address. Patient will need to call [employer] every month order her refills.  Priscilla Cantrell, [place]  [phone]

## 2021-06-28 ENCOUNTER — Telehealth: Payer: Self-pay

## 2021-06-28 ENCOUNTER — Ambulatory Visit (INDEPENDENT_AMBULATORY_CARE_PROVIDER_SITE_OTHER): Payer: Medicare Other | Admitting: Family Medicine

## 2021-06-28 ENCOUNTER — Encounter: Payer: Self-pay | Admitting: Family Medicine

## 2021-06-28 VITALS — BP 120/72 | HR 65 | Temp 97.7°F | Ht 60.0 in | Wt 125.6 lb

## 2021-06-28 DIAGNOSIS — R140 Abdominal distension (gaseous): Secondary | ICD-10-CM | POA: Diagnosis not present

## 2021-06-28 DIAGNOSIS — R6881 Early satiety: Secondary | ICD-10-CM

## 2021-06-28 DIAGNOSIS — R109 Unspecified abdominal pain: Secondary | ICD-10-CM

## 2021-06-28 LAB — CLOSTRIDIUM DIFFICILE TOXIN B, QUALITATIVE, REAL-TIME PCR: Toxigenic C. Difficile by PCR: NOT DETECTED

## 2021-06-28 NOTE — Progress Notes (Signed)
OFFICE VISIT  06/28/2021  CC:  Chief Complaint  Patient presents with   Abdominal Pain    Started 4 days ago; c/o diarrhea as well. Feels like extra fluid in abdomen. Hurting under shoulder blade. Has taken 4 anti-diarrhea pills today. Denies nausea, sweating, pain in neck or jaw. Got tick bite 2 weeks ago; went to [place] to have it removed.    Patient is a 86 y.o. female who presents for abdominal discomfort.  HPI: About 4 to 5-day history of vague bloating sensation and early satiety, feels like lower abdomen is pushing out some.  No nausea or vomiting, no fever.  No distinct abdominal pain.  Yesterday she was having some left scapular pain.  None today. Lower legs swelling is minimal, unchanged. She had some diarrhea bowel movements today. Her usual stool pattern is having a formed BM most days and some small fecal leakage fairly often. She had a tick bite that she went to urgent care for and got doxycycline prescribed for 7 days on 06/01/2021.  She was not ill at the time of being seen for the tick bite.  Wt stable  ROS as above, plus--> no CP, no SOB, no wheezing, no cough, no dizziness, no HAs, no rashes, no melena/hematochezia.  No polyuria or polydipsia.  No myalgias or arthralgias.  No focal weakness, paresthesias, or tremors.  No acute vision or hearing abnormalities.  No dysuria or unusual/new urinary urgency or frequency.  No recent changes in lower legs.  No palpitations.    Past Medical History:  Diagnosis Date   Allergic rhinitis    with upper airway cough: as of 07/2017 pulm f/u she was instructed to use astelin, flonase, and saline more consistently   BPPV (benign paroxysmal positional vertigo) 03/2018   Chest pain, non-cardiac    Cardiac CT showed no signif obstructive dz, mildly elevated calcium level (Dr. [name]).   Chronic cystitis with hematuria    [name]   Chronic hypoxemic respiratory failure ([place]) 02/02/2015   2L oxygen 24/7    Chronic renal insufficiency, stage 2 (mild)    GFR approx 60   Cold hands    NCS/EMGs normal.  Hand dysesthesias per neuro--reassured   COPD (chronic obstructive pulmonary disease) ([place])    GOLD 4.  spirometry 01/10/09 FEV 0.97(52%), FEV1% 47.  With chronic bronchitis as of 07/2017.   DDD (degenerative disc disease), cervical    1998 MRI C5-6 impingemt (left).  MRI 2021 (neurol) 1 level canal stenosis, 1 level foraminal stenosis--->PT   DDD (degenerative disc disease), lumbar    Depression with anxiety 04/15/2011   Diarrheal disease Summer 2017   ? 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).  As of 10/05/15 pt reports GI dx'd her with C diff and rx'd more flagyl and she is also on cholestyramine.  As of 02/2016, pt's sx's resolved completely.   Diverticulosis of colon    Procto '97 and colonoscopy 2002   Dysplastic nevus of upper extremity 04/2014   R tricep ([name])   Edema of both lower extremities due to peripheral venous insufficiency    Varicosities bilat, swelling L>R.  R baker's cyst.  Got vein clinic eval 02/2018->sclerotherapy recommended but since no hemorrhage or ulceration, insurance will not cover the procedure.   Family history of adverse reaction to anesthesia    mother died when patient age 89 receiving anesthesia for thyroid surgery  Fibrocystic breast disease    w/fibroadenoma.  Bx's showed NO atypia (Dr. Margot Chimes).  Mammo neg 2008.   GERD (gastroesophageal reflux disease)    History of double vision    Ophthalmologist, Dr. Herbert Deaner, is further evaluating this with MRI  orbits and limited brain (myesthenia gravis testing neg 07/2010)   History of home oxygen therapy    uses 2 liters at hs   Hypertension    EKG 03/2010 normal   Hypothyroidism    Hashimoto's, dx'd 1989   Idiopathic peripheral neuropathy 04/2018   Sensory (numb feet) S1 distribution bilat, c/w spinal stenosis  sensory neuropathy (no pain). 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  [facility] [place], MRI C spine->some spondylosis/stenosis.  UE NCS/EMS: normal 04/2019.   Lesion of right native kidney 2017   found on CT 02/2015.  F/u MRI 03/31/15 showed it to be smaller and less likely of any significance but repeat CT renal protocol in 25mowas recommended by radiology.  This was done 10/26/15 and showed no interval change in the lesion, but b/c the lesion enhances with contrast, radiol rec'd urol consult (b/c pap renal RCC could not be excluded).  Saw urol 03/06/16--stable on u/s 09/2016 and 09/2017.   OAB (overactive bladder)    Osteoarthritis, hip, bilateral    she is s/p bilat THA as of 02/2018.   Osteoporosis    radius, T score -2.5->rec'd alendronate 08/2019->plan rpt DEXA 08/2021.   Pneumonia    Postmenopausal atrophic vaginitis    improved with estrace 2 X/week.   Prolapse of vaginal cuff after hysterectomy    10/2018: Dr. FWayne Both Mild colpocele 04/2019 per GYN (no surgery)   Pulmonary nodule    CT chest 12/10, June 2011.  No nodule on CT 06/2010.   Recurrent Clostridium difficile diarrhea    Episode 09/19/2016: resolved with prolonged course of flagyl.  11/2016 recurrence treated with 10d of Dificid (Fidaxomicin) by GI (Dr. MEarlean Shawl.   Recurrent UTI    likely related to pt's atrophic vaginitis.  Urol started vaginal estrace 03/2016.   Rib fracture 09/2018   R 7th, laterally-->sustained when her dog pulled her over and she fell.   RLS (restless legs syndrome)    neuro->gabapentin trial 2020/21   Shingles 02/2014   R abd/flank   Spinal stenosis of lumbar region with neurogenic claudication    2008 MRI (L3-4, L4-5, L5-S1).  +S1 distribution sensory loss bilat    Past Surgical History:  Procedure Laterality Date   ABDOMINAL HYSTERECTOMY  1978   no BSO per pt--nonmalignant reasons   APPENDECTOMY     BREAST CYST EXCISION     Last screening  mammogram 10/2010 was normal.   CHOLECYSTECTOMY  2001   COLONOSCOPY  04/2005; 12/06/2015   2007 NORMAL.  2017 adenomatous polyp x 1.  Mod sigmoid diverticulosis (Dr. MEarlean Shawl.     DEXA  05/18/2017; 08/7019   2019 NORMAL (T-score 0.1).  08/2019->T score -2.5->alendronate started->plan Rpt 08/2021.   HIP CLOSED REDUCTION Right 12/25/2015   Procedure: CLOSED REDUCTION HIP;  Surgeon: JNicholes Stairs MD;  Location: [facility];  Service: Orthopedics;  Laterality: Right;   NCV/EMS  10/2018   chronic and symmetric sensorimotor axonal polyneuropathy affecting the lower extremities   TONSILLECTOMY     TOTAL HIP ARTHROPLASTY Right 11/01/2014   Procedure: RIGHT TOTAL HIP ARTHROPLASTY;  Surgeon: RLatanya Maudlin MD;  Location: [facility];  Service: Orthopedics;  Laterality: Right;   TOTAL HIP ARTHROPLASTY Left 12/08/2017   Procedure: LEFT  TOTAL HIP ARTHROPLASTY ANTERIOR APPROACH;  Surgeon: Paralee Cancel, MD;  Location: [facility];  Service: Orthopedics;  Laterality: Left;  70 mins   TRANSTHORACIC ECHOCARDIOGRAM  08/2014   2016 EF 60-65%, grade I diast dysfxn. 06/21/21 unchanged.   TUBAL LIGATION  1974    Outpatient Medications Prior to Visit  Medication Sig Dispense Refill   albuterol (PROAIR HFA) 108 (90 Base) MCG/ACT inhaler Inhale 2 puffs into the lungs every 4 (four) hours as needed for wheezing or shortness of breath. 8.5 g 1   alendronate (FOSAMAX) 70 MG tablet TAKE 1 TABLET(70 MG) BY MOUTH 1 TIME A WEEK WITH A FULL GLASS OF WATER AND ON AN EMPTY STOMACH 12 tablet 3   Artificial Tear Solution (SOOTHE XP) SOLN Place 1 drop into both eyes 2 (two) times daily as needed (irritation).     aspirin EC 81 MG tablet Take 81 mg by mouth daily.     azelastine (ASTELIN) 0.1 % nasal spray Place 2 sprays into both nostrils 2 (two) times daily. Use in each nostril as directed 30 mL 3   cetirizine (ZYRTEC) 10 MG tablet Take 1 tablet (10 mg total) by mouth daily. 30 tablet 6   clobetasol (TEMOVATE) 0.05 % external solution  APPLY TO THE AFFECTED AREA EVERY DAY 50 mL 4   cycloSPORINE (RESTASIS OP) Apply to eye.     diazepam (VALIUM) 5 MG tablet TAKE 1 TABLET BY MOUTH DAILY AS NEEDED FOR ANXIETY AND INSOMNIA 30 tablet 5   fluticasone (CUTIVATE) 0.05 % cream Apply topically 2 (two) times daily. 30 g 1   fluticasone (FLONASE) 50 MCG/ACT nasal spray Place 2 sprays into both nostrils daily. 16 g 6   Fluticasone-Umeclidin-Vilant (TRELEGY ELLIPTA) 100-62.5-25 MCG/ACT AEPB Inhale 1 puff into the lungs daily. 180 each 3   furosemide (LASIX) 20 MG tablet 2 tabs qd alt with 1 tab qd 90 tablet 1   levothyroxine (SYNTHROID) 100 MCG tablet Take 1 tablet (100 mcg total) by mouth daily. 30 tablet 2   Lifitegrast (XIIDRA) 5 % SOLN Apply to eye in the morning and at bedtime.     nitrofurantoin (MACRODANTIN) 50 MG capsule TAKE 1 CAPSULE(50 MG) BY MOUTH AT BEDTIME 90 capsule 3   Probiotic Product (PROBIOTIC DAILY PO) Take 1 capsule by mouth daily.      roflumilast (DALIRESP) 500 MCG TABS tablet Take 1 tablet (500 mcg total) by mouth daily. 90 tablet 3   spironolactone (ALDACTONE) 25 MG tablet Take 1 tablet (25 mg total) by mouth daily. 30 tablet 0   triamcinolone cream (KENALOG) 0.1 % Apply 1 application topically 2 (two) times daily. 45 g 2   venlafaxine (EFFEXOR) 50 MG tablet Take 1 tablet (50 mg total) by mouth 2 (two) times daily. 180 tablet 3   Vibegron (GEMTESA) 75 MG TABS Take 1 capsule by mouth daily. 30 tablet 11   VISINE 0.05 % ophthalmic solution 1 drop  as needed     vitamin B-12 (CYANOCOBALAMIN) 100 MCG tablet Take 100 mcg by mouth daily.     VITAMIN D PO Take by mouth.     doxycycline (VIBRAMYCIN) 100 MG capsule Take 100 mg by mouth 2 (two) times daily. (Patient not taking: Reported on 06/28/2021)     No facility-administered medications prior to visit.    Allergies  Allergen Reactions   Losartan Other (See Comments)    hyperkalemia   Penicillins Hives, Swelling and Rash    Has patient had a PCN reaction causing  immediate  rash, facial/tongue/throat swelling, SOB or lightheadedness with hypotension: YES Has patient had a PCN reaction causing severe rash involving mucus membranes or skin necrosis: NO Has patient had a PCN reaction that required hospitalization NO Has patient had a PCN reaction occurring within the last 10 years: NO If all of the above answers are "NO", then may proceed with Cephalosporin use.    Cefixime [Kdc:Cefixime] Other (See Comments)    unspecified   Gabapentin Other (See Comments)    Feels woozy   Indocin [Indomethacin] Other (See Comments)    Painful tongue and throat   Singulair [Montelukast Sodium]     Chest tightness, decreased mental clarity   Sulfa Antibiotics Other (See Comments)    unspecified   Ace Inhibitors Other (See Comments)    cough   Statins Other (See Comments)    Myalgias     ROS As per HPI  PE:    06/28/2021    3:01 PM 06/19/2021    3:07 PM 06/07/2021    4:30 PM  Vitals with BMI  Height '5\' 0"'  '5\' 0"'    Weight 125 lbs 10 oz 127 lbs 13 oz   BMI 51.83 35.82   Systolic 518 984 210  Diastolic 72 73 67  Pulse 65 65 63  02 sat 92% today  Physical Exam  Gen: Alert, a bit tired-appearing.  Patient is oriented to person, place, time, and situation. AFFECT: pleasant, lucid thought and speech. ZXY:OFVW: no injection, icteris, swelling, or exudate.  EOMI, PERRLA. Mouth: lips without lesion/swelling.  Oral mucosa pink and moist. Oropharynx without erythema, exudate, or swelling.  CV: RRR, no m/r/g.   LUNGS: CTA bilat, nonlabored resps, good aeration in all lung fields. ABD: soft, NT, no obvious distention, no mass or HSM or bruit. Question suprapubic fullness/firmness. LEGS: trace to 1+ pitting edema, no erythema  LABS:  Last CBC Lab Results  Component Value Date   WBC 6.9 06/07/2021   HGB 13.0 06/07/2021   HCT 40.0 06/07/2021   MCV 91.5 06/07/2021   MCH 29.7 06/07/2021   RDW 14.0 06/07/2021   PLT 254 86/77/3736   Last metabolic panel Lab  Results  Component Value Date   GLUCOSE 111 (H) 06/14/2021   NA 143 06/14/2021   K 4.1 06/14/2021   CL 107 06/14/2021   CO2 26 06/14/2021   BUN 18 06/14/2021   CREATININE 1.00 06/14/2021   GFRNONAA 46 (L) 06/07/2021   CALCIUM 9.6 06/14/2021   PHOS 3.8 01/31/2013   PROT 6.3 12/07/2020   ALBUMIN 4.5 05/09/2020   BILITOT 0.5 12/07/2020   ALKPHOS 105 05/09/2020   AST 14 12/07/2020   ALT 10 12/07/2020   ANIONGAP 12 06/07/2021   Lab Results  Component Value Date   LIPASE 14 07/01/2015   Last thyroid functions Lab Results  Component Value Date   TSH 19.49 (H) 05/24/2021   T3TOTAL 97.2 07/11/2010   T4TOTAL 12.4 07/11/2010   IMPRESSION AND PLAN:  #1 abdominal bloating, early satiety. Check c-Met and lipase today. She has abdominal CT already set for 07/04/2021 for further evaluation for her asymmetric lower extremity swelling--ordered by Dr. Aleda Grana, her vein specialist. No medication changes/new medications today.  An After Visit Summary was printed and given to the patient.  FOLLOW UP: Return if symptoms worsen or fail to improve.  Signed:  Crissie Sickles, MD           06/28/2021

## 2021-06-28 NOTE — Telephone Encounter (Signed)
Pt has been scheduled for appt with PCP today  [place] RECORD [facility] Patient Name: Priscilla Cantrell Gender: Female DOB: 1935-08-22 Age: 86 Y 68 M 24 D Return Phone Number: [phone] (Primary), [phone] (Secondary) Address: [place]  [address] Client [facility] - Client Client Site [facility] - Day Provider Priscilla Cantrell - MD Contact Type Call Who Is Calling Patient / Member / Family / Caregiver Call Type Triage / Clinical Relationship To Patient Self Return Phone Number [phone] (Secondary) Chief Complaint BREATHING - shortness of breath or sounds breathless Reason for Call Symptomatic / Request for [name] states she is trying to make an appt. for tomorrow. Caller states her stomach is swollen, and she is short of breath. Translation No Nurse Assessment Nurse: Priscilla Limes, RN, Priscilla Cantrell Date/Time (Eastern Time): 06/27/2021 6:07:02 PM Confirm and document reason for call. If symptomatic, describe symptoms. ---Caller states she has abd swelling and is sob x 4 days ago. Caller reports no other symptoms. Does the patient have any new or worsening symptoms? ---Yes Will a triage be completed? ---Yes Related visit to physician within the last 2 weeks? ---No Does the PT have any chronic conditions? (i.e. diabetes, asthma, this includes High risk factors for pregnancy, etc.) ---Yes List chronic conditions. ---COPD, Is this a behavioral health or substance abuse call? ---No Guidelines Guideline Title Affirmed Question Affirmed Notes Nurse Date/Time (Eastern Time) Breathing Difficulty [1] MILD difficulty breathing (e.g., minimal/no SOB at rest, SOB with walking, pulse <100) AND [2] NEW-onset or WORSE than normal Priscilla Limes, RN, Priscilla Cantrell 06/27/2021 6:14:27 PM Abdomen Bloating and Swelling [1]  MODERATESEVERE Priscilla Bringier, RN, Priscilla Cantrell 06/27/2021 6:19:04 PM PLEASE NOTE: All timestamps contained within this report are represented as [place] Standard Time. CONFIDENTIALTY NOTICE: This fax transmission is intended only for the addressee. It contains information that is legally privileged, confidential or otherwise protected from use or disclosure. If you are not the intended recipient, you are strictly prohibited from reviewing, disclosing, copying using or disseminating any of this information or taking any action in reliance on or regarding this information. If you have received this fax in error, please notify us immediately by telephone so that we can arrange for its return to [place]. Phone: [phone], Toll-Free: [phone], Fax: [phone] Page: 2 of 2 Call Id: [id number] Guidelines Guideline Title Affirmed Question Affirmed Notes Nurse Date/Time Priscilla Cantrell Time) SWELLING of abdomen (e.g., looks very distended or swollen) AND [2] NEW-onset or much worse Disp. Time Priscilla Cantrell Time) Disposition Final User 06/27/2021 6:05:39 PM Send to Urgent Queue Priscilla Cantrell 06/27/2021 6:18:52 PM See HCP within 4 Hours (or PCP triage) Priscilla Limes, RN, Priscilla Cantrell 06/27/2021 6:19:53 PM See HCP within 4 Hours (or PCP triage) Yes Priscilla Limes, RN, Priscilla Cantrell Disagree/Comply Disagree Caller Understands Yes PreDisposition Call Doctor Care Advice Given Per Guideline SEE HCP (OR PCP TRIAGE) WITHIN 4 HOURS: CALL BACK IF: * You become worse CARE ADVICE given per Breathing Difficulty (Adult) guideline. SEE HCP (OR PCP TRIAGE) WITHIN 4 HOURS: CALL BACK IF: * You become worse CARE ADVICE given per Abdomen Bloating and Swelling (Adult) guideline. Referrals GO TO FACILITY REFUSED

## 2021-06-29 LAB — COMPREHENSIVE METABOLIC PANEL
AG Ratio: 2 (calc) (ref 1.0–2.5)
ALT: 13 U/L (ref 6–29)
AST: 16 U/L (ref 10–35)
Albumin: 4.4 g/dL (ref 3.6–5.1)
Alkaline phosphatase (APISO): 82 U/L (ref 37–153)
BUN: 19 mg/dL (ref 7–25)
CO2: 25 mmol/L (ref 20–32)
Calcium: 9.5 mg/dL (ref 8.6–10.4)
Chloride: 105 mmol/L (ref 98–110)
Creat: 0.95 mg/dL (ref 0.60–0.95)
Globulin: 2.2 g/dL (calc) (ref 1.9–3.7)
Glucose, Bld: 80 mg/dL (ref 65–99)
Potassium: 4.3 mmol/L (ref 3.5–5.3)
Sodium: 140 mmol/L (ref 135–146)
Total Bilirubin: 0.6 mg/dL (ref 0.2–1.2)
Total Protein: 6.6 g/dL (ref 6.1–8.1)

## 2021-06-29 LAB — LIPASE: Lipase: 17 U/L (ref 7–60)

## 2021-07-03 ENCOUNTER — Encounter: Payer: Self-pay | Admitting: Family Medicine

## 2021-07-03 ENCOUNTER — Ambulatory Visit (INDEPENDENT_AMBULATORY_CARE_PROVIDER_SITE_OTHER): Payer: Medicare Other | Admitting: Family Medicine

## 2021-07-03 VITALS — BP 120/68 | HR 77 | Temp 98.1°F | Wt 124.4 lb

## 2021-07-03 DIAGNOSIS — R600 Localized edema: Secondary | ICD-10-CM | POA: Diagnosis not present

## 2021-07-03 DIAGNOSIS — R197 Diarrhea, unspecified: Secondary | ICD-10-CM | POA: Diagnosis not present

## 2021-07-03 DIAGNOSIS — I10 Essential (primary) hypertension: Secondary | ICD-10-CM

## 2021-07-03 DIAGNOSIS — E039 Hypothyroidism, unspecified: Secondary | ICD-10-CM

## 2021-07-03 DIAGNOSIS — N3021 Other chronic cystitis with hematuria: Secondary | ICD-10-CM

## 2021-07-03 DIAGNOSIS — F5104 Psychophysiologic insomnia: Secondary | ICD-10-CM

## 2021-07-03 LAB — TSH: TSH: 6.73 u[IU]/mL — ABNORMAL HIGH (ref 0.35–5.50)

## 2021-07-03 LAB — BASIC METABOLIC PANEL
BUN: 16 mg/dL (ref 6–23)
CO2: 25 mEq/L (ref 19–32)
Calcium: 9.5 mg/dL (ref 8.4–10.5)
Chloride: 106 mEq/L (ref 96–112)
Creatinine, Ser: 0.98 mg/dL (ref 0.40–1.20)
GFR: 53.13 mL/min — ABNORMAL LOW (ref >60.00)
Glucose, Bld: 83 mg/dL (ref 70–99)
Potassium: 3.8 mEq/L (ref 3.5–5.1)
Sodium: 142 mEq/L (ref 135–145)

## 2021-07-03 MED ORDER — SPIRONOLACTONE 25 MG PO TABS: ORAL_TABLET | ORAL | 1 refills | Status: DC

## 2021-07-03 MED ORDER — DIAZEPAM 5 MG PO TABS: ORAL_TABLET | ORAL | 5 refills | Status: DC

## 2021-07-03 MED ORDER — LEVOTHYROXINE SODIUM 100 MCG PO TABS: 100.0000 ug | ORAL_TABLET | Freq: Every day | ORAL | 2 refills | Status: DC

## 2021-07-03 MED ORDER — NITROFURANTOIN MACROCRYSTAL 50 MG PO CAPS: ORAL_CAPSULE | ORAL | 3 refills | Status: DC

## 2021-07-03 NOTE — Progress Notes (Signed)
OFFICE VISIT  07/03/2021  CC:  Chief Complaint  Patient presents with   Hypertension   Patient is a 86 y.o. female who presents for f/u HTN and general abd discomfort and bilat LE edema (L>R).  INTERIM HX: Labs 06/28/21 showed:  electrolytes normal, renal function stable, and hepatic panel and lipase normal. C. difficile PCR negative.  She feels well today. No recent change in lower extremities--they are essentially not edematous at all. She continues on Lasix 40 mg per morning and 20 in evening.  No home blood pressure monitoring lately.  She is taking spironolactone 25 mg tab, 1 and 1/2 tabs twice a day.  Stools are consistently more solid lately.  She does still have an average of 1 loose stool a day, only occasionally watery now.  No abdominal pain.  No bloating.  No fever.  No nausea or vomiting.   PMP AWARE reviewed today: most recent rx for diazepam was filled 05/24/21, # 30, rx by me. No red flags.  ROS as above, plus--> she has mild stable dyspnea on exertion.  No CP, no SOB at rest, no wheezing, no cough, no dizziness, no HAs, no rashes, no melena/hematochezia.  No polyuria or polydipsia.  No myalgias or arthralgias.  No focal weakness, paresthesias, or tremors.  No acute vision or hearing abnormalities.  No dysuria or unusual/new urinary urgency or frequency.  No recent changes in lower legs. No n/v/d or abd pain.  No palpitations.    Past Medical History:  Diagnosis Date   Allergic rhinitis    with upper airway cough: as of 07/2017 pulm f/u she was instructed to use astelin, flonase, and saline more consistently   BPPV (benign paroxysmal positional vertigo) 03/2018   Chest pain, non-cardiac    Cardiac CT showed no signif obstructive dz, mildly elevated calcium level (Dr. [name]).   Chronic cystitis with hematuria    [name]   Chronic hypoxemic respiratory failure ([place]) 02/02/2015   2L oxygen 24/7   Chronic renal insufficiency, stage 2 (mild)    GFR  approx 60   Cold hands    NCS/EMGs normal.  Hand dysesthesias per neuro--reassured   COPD (chronic obstructive pulmonary disease) ([place])    GOLD 4.  spirometry 01/10/09 FEV 0.97(52%), FEV1% 47.  With chronic bronchitis as of 07/2017.   DDD (degenerative disc disease), cervical    1998 MRI C5-6 impingemt (left).  MRI 2021 (neurol) 1 level canal stenosis, 1 level foraminal stenosis--->PT   DDD (degenerative disc disease), lumbar    Depression with anxiety 04/15/2011   Diarrheal disease Summer 2017   ? 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).  As of 10/05/15 pt reports GI dx'd her with C diff and rx'd more flagyl and she is also on cholestyramine.  As of 02/2016, pt's sx's resolved completely.   Diverticulosis of colon    Procto '97 and colonoscopy 2002   Dysplastic nevus of upper extremity 04/2014   R tricep ([name])   Edema of both lower extremities due to peripheral venous insufficiency    Varicosities bilat, swelling L>R.  R baker's cyst.  Got vein clinic eval 02/2018->sclerotherapy recommended but since no hemorrhage or ulceration, insurance will not cover the procedure.   Family history of adverse reaction to anesthesia    mother died when patient age 81 receiving anesthesia for thyroid surgery   Fibrocystic breast disease    w/fibroadenoma.  Bx's showed  NO atypia (Dr. Margot Chimes).  Mammo neg 2008.   GERD (gastroesophageal reflux disease)    History of double vision    Ophthalmologist, Dr. Herbert Deaner, is further evaluating this with MRI  orbits and limited brain (myesthenia gravis testing neg 07/2010)   History of home oxygen therapy    uses 2 liters at hs   Hypertension    EKG 03/2010 normal   Hypothyroidism    Hashimoto's, dx'd 1989   Idiopathic peripheral neuropathy 04/2018   Sensory (numb feet) S1 distribution bilat, c/w spinal stenosis sensory neuropathy (no pain). 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  Labs [place], MRI C spine->some spondylosis/stenosis.  UE NCS/EMS: normal 04/2019.   Lesion of right native kidney 2017   found on CT 02/2015.  F/u MRI 03/31/15 showed it to be smaller and less likely of any significance but repeat CT renal protocol in [place] recommended by radiology.  This was done 10/26/15 and showed no interval change in the lesion, but b/c the lesion enhances with contrast, radiol rec'd urol consult (b/c pap renal RCC could not be excluded).  Saw urol 03/06/16--stable on u/s 09/2016 and 09/2017.   OAB (overactive bladder)    Osteoarthritis, hip, bilateral    she is s/p bilat THA as of 02/2018.   Osteoporosis    radius, T score -2.5->rec'd alendronate 08/2019->plan rpt DEXA 08/2021.   Pneumonia    Postmenopausal atrophic vaginitis    improved with estrace 2 X/week.   Prolapse of vaginal cuff after hysterectomy    10/2018: Dr. FWayne Both Mild colpocele 04/2019 per GYN (no surgery)   Pulmonary nodule    CT chest 12/10, June 2011.  No nodule on CT 06/2010.   Recurrent Clostridium difficile diarrhea    Episode 09/19/2016: resolved with prolonged course of flagyl.  11/2016 recurrence treated with 10d of Dificid (Fidaxomicin) by GI (Dr. MEarlean Shawl.   Recurrent UTI    likely related to pt's atrophic vaginitis.  Urol started vaginal estrace 03/2016.   Rib fracture 09/2018   R 7th, laterally-->sustained when her dog pulled her over and she fell.   RLS (restless legs syndrome)    neuro->gabapentin trial 2020/21   Shingles 02/2014   R abd/flank   Spinal stenosis of lumbar region with neurogenic claudication    2008 MRI (L3-4, L4-5, L5-S1).  +S1 distribution sensory loss bilat    Past Surgical History:  Procedure Laterality Date   ABDOMINAL HYSTERECTOMY  1978   no BSO per pt--nonmalignant reasons   APPENDECTOMY     BREAST CYST EXCISION     Last screening mammogram 10/2010 was normal.   CHOLECYSTECTOMY  2001    COLONOSCOPY  04/2005; 12/06/2015   2007 NORMAL.  2017 adenomatous polyp x 1.  Mod sigmoid diverticulosis (Dr. MEarlean Shawl.     DEXA  05/18/2017; 08/7019   2019 NORMAL (T-score 0.1).  08/2019->T score -2.5->alendronate started->plan Rpt 08/2021.   HIP CLOSED REDUCTION Right 12/25/2015   Procedure: CLOSED REDUCTION HIP;  Surgeon: JNicholes Stairs MD;  Location: [facility];  Service: Orthopedics;  Laterality: Right;   NCV/EMS  10/2018   chronic and symmetric sensorimotor axonal polyneuropathy affecting the lower extremities   TONSILLECTOMY     TOTAL HIP ARTHROPLASTY Right 11/01/2014   Procedure: RIGHT TOTAL HIP ARTHROPLASTY;  Surgeon: RLatanya Maudlin MD;  Location: [facility];  Service: Orthopedics;  Laterality: Right;   TOTAL HIP ARTHROPLASTY Left 12/08/2017   Procedure: LEFT TOTAL HIP ARTHROPLASTY ANTERIOR APPROACH;  Surgeon: OParalee Cancel MD;  Location: [facility];  Service: Orthopedics;  Laterality: Left;  70 mins   TRANSTHORACIC ECHOCARDIOGRAM  08/2014   2016 EF 60-65%, grade I diast dysfxn. 06/21/21 unchanged.   TUBAL LIGATION  1974    Outpatient Medications Prior to Visit  Medication Sig Dispense Refill   albuterol (PROAIR HFA) 108 (90 Base) MCG/ACT inhaler Inhale 2 puffs into the lungs every 4 (four) hours as needed for wheezing or shortness of breath. 8.5 g 1   alendronate (FOSAMAX) 70 MG tablet TAKE 1 TABLET(70 MG) BY MOUTH 1 TIME A WEEK WITH A FULL GLASS OF WATER AND ON AN EMPTY STOMACH 12 tablet 3   Artificial Tear Solution (SOOTHE XP) SOLN Place 1 drop into both eyes 2 (two) times daily as needed (irritation).     aspirin EC 81 MG tablet Take 81 mg by mouth daily.     azelastine (ASTELIN) 0.1 % nasal spray Place 2 sprays into both nostrils 2 (two) times daily. Use in each nostril as directed 30 mL 3   cetirizine (ZYRTEC) 10 MG tablet Take 1 tablet (10 mg total) by mouth daily. 30 tablet 6   clobetasol (TEMOVATE) 0.05 % external solution APPLY TO THE AFFECTED AREA EVERY DAY 50 mL 4    cycloSPORINE (RESTASIS OP) Apply to eye.     fluticasone (CUTIVATE) 0.05 % cream Apply topically 2 (two) times daily. 30 g 1   fluticasone (FLONASE) 50 MCG/ACT nasal spray Place 2 sprays into both nostrils daily. 16 g 6   Fluticasone-Umeclidin-Vilant (TRELEGY ELLIPTA) 100-62.5-25 MCG/ACT AEPB Inhale 1 puff into the lungs daily. 180 each 3   furosemide (LASIX) 20 MG tablet 2 tabs qd alt with 1 tab qd 90 tablet 1   Lifitegrast (XIIDRA) 5 % SOLN Apply to eye in the morning and at bedtime.     Probiotic Product (PROBIOTIC DAILY PO) Take 1 capsule by mouth daily.      roflumilast (DALIRESP) 500 MCG TABS tablet Take 1 tablet (500 mcg total) by mouth daily. 90 tablet 3   triamcinolone cream (KENALOG) 0.1 % Apply 1 application topically 2 (two) times daily. 45 g 2   venlafaxine (EFFEXOR) 50 MG tablet Take 1 tablet (50 mg total) by mouth 2 (two) times daily. 180 tablet 3   Vibegron (GEMTESA) 75 MG TABS Take 1 capsule by mouth daily. 30 tablet 11   VISINE 0.05 % ophthalmic solution 1 drop  as needed     vitamin B-12 (CYANOCOBALAMIN) 100 MCG tablet Take 100 mcg by mouth daily.     VITAMIN D PO Take by mouth.     diazepam (VALIUM) 5 MG tablet TAKE 1 TABLET BY MOUTH DAILY AS NEEDED FOR ANXIETY AND INSOMNIA 30 tablet 5   levothyroxine (SYNTHROID) 100 MCG tablet Take 1 tablet (100 mcg total) by mouth daily. 30 tablet 2   nitrofurantoin (MACRODANTIN) 50 MG capsule TAKE 1 CAPSULE(50 MG) BY MOUTH AT BEDTIME (Patient not taking: Reported on 07/03/2021) 90 capsule 3   spironolactone (ALDACTONE) 25 MG tablet Take 1 tablet (25 mg total) by mouth daily. 30 tablet 0   No facility-administered medications prior to visit.    Allergies  Allergen Reactions   Losartan Other (See Comments)    hyperkalemia   Penicillins Hives, Swelling and Rash    Has patient had a PCN reaction causing immediate rash, facial/tongue/throat swelling, SOB or lightheadedness with hypotension: YES Has patient had a PCN reaction causing  severe rash involving mucus membranes or skin necrosis: NO Has patient had a  PCN reaction that required hospitalization NO Has patient had a PCN reaction occurring within the last 10 years: NO If all of the above answers are "NO", then may proceed with Cephalosporin use.    Cefixime [Kdc:Cefixime] Other (See Comments)    unspecified   Gabapentin Other (See Comments)    Feels woozy   Indocin [Indomethacin] Other (See Comments)    Painful tongue and throat   Singulair [Montelukast Sodium]     Chest tightness, decreased mental clarity   Sulfa Antibiotics Other (See Comments)    unspecified   Ace Inhibitors Other (See Comments)    cough   Statins Other (See Comments)    Myalgias     ROS As per HPI  PE:    07/03/2021   10:18 AM 06/28/2021    3:01 PM 06/19/2021    3:07 PM  Vitals with BMI  Height  '5\' 0"'$  '5\' 0"'$   Weight 124 lbs 6 oz 125 lbs 10 oz 127 lbs 13 oz  BMI 24.3 53.61 44.31  Systolic 540 086 761  Diastolic 68 72 73  Pulse 77 65 65   Physical Exam  Gen: Alert, well appearing.  Patient is oriented to person, place, time, and situation. AFFECT: pleasant, lucid thought and speech. Cardiovascular: Regular rhythm and rate without murmur rub or gallop. Very soft early inspiratory crackles in both bases consistent with atelectatic sounds.  Otherwise clear to auscultation bilaterally.  Nonlabored respirations. Extremities: She has no pitting edema, no cyanosis.  No erythema.  LABS:  Last CBC Lab Results  Component Value Date   WBC 6.9 06/07/2021   HGB 13.0 06/07/2021   HCT 40.0 06/07/2021   MCV 91.5 06/07/2021   MCH 29.7 06/07/2021   RDW 14.0 06/07/2021   PLT 254 95/10/3265   Last metabolic panel Lab Results  Component Value Date   GLUCOSE 80 06/28/2021   NA 140 06/28/2021   K 4.3 06/28/2021   CL 105 06/28/2021   CO2 25 06/28/2021   BUN 19 06/28/2021   CREATININE 0.95 06/28/2021   GFRNONAA 46 (L) 06/07/2021   CALCIUM 9.5 06/28/2021   PHOS 3.8 01/31/2013    PROT 6.6 06/28/2021   ALBUMIN 4.5 05/09/2020   BILITOT 0.6 06/28/2021   ALKPHOS 105 05/09/2020   AST 16 06/28/2021   ALT 13 06/28/2021   ANIONGAP 12 06/07/2021   Lab Results  Component Value Date   LIPASE 17 06/28/2021   Last thyroid functions Lab Results  Component Value Date   TSH 19.49 (H) 05/24/2021   T3TOTAL 97.2 07/11/2010   T4TOTAL 12.4 07/11/2010   IMPRESSION AND PLAN:  #1 GI symptoms: She has had intermittent/waxing and waning diarrhea, seems subacute. C. difficile PCR neg 06/27/2021. Her bloating sensation has resolved now. She does have a CT abdomen pelvis scheduled for tomorrow.  This was initially ordered by Dr. Renaldo Reel to further evaluate her asymmetric lower extremity edema. Complete blood count and complete metabolic panel and lipase have been normal. We have referred her to gastroenterology--we will check on the status of this referral.  #2 bilateral lower extremity edema, asymmetric--left> right.  She does have history of lower extremity venous insufficiency. Well-controlled currently on Lasix 40 mg every morning and 20 mg every afternoon. Left lower extremity venous Dopplers have been negative x2 since this all started at the end of March.  Echocardiogram a couple of weeks ago showed EF 60 to 12%, grade 1 diastolic dysfunction--unchanged compared to 2016. She was on amlodipine long-term for hypertension so we  discontinued this medication and doing so may have helped her lower extremity edema. Additionally, her TSH was elevated about 6 weeks ago so we did increased her dose to 100 mcg/day.  Rechecking TSH today. As noted in #1 above, Dr. Renaldo Reel is further evaluating her with CT abdomen pelvis to make sure nothing is having a mass effect on pelvic veins or lymphatics.  #3 hypertension, well controlled on spironolactone 25 mg, 1 and 1/2 tabs twice daily. Basic metabolic panel today.  #4 hypothyroidism, TSH up to 19 about 6 weeks ago.  Increased  levothyroxine to 100 mcg a day at that time. TSH monitoring today.  #5 anxiety, with anxiety-induced insomnia. Diazepam 5 mg nightly has been helpful long-term. Prescription renewed today.  An After Visit Summary was printed and given to the patient.  FOLLOW UP: Return in about 4 weeks (around 07/31/2021) for routine chronic illness f/u.  Signed:  Crissie Sickles, MD           07/03/2021

## 2021-07-04 ENCOUNTER — Ambulatory Visit
Admission: RE | Admit: 2021-07-04 | Discharge: 2021-07-04 | Disposition: A | Discharge status: Home / Self Care | Payer: Medicare Other | Source: Ambulatory Visit | Attending: Family Medicine | Admitting: Family Medicine

## 2021-07-04 DIAGNOSIS — D734 Cyst of spleen: Secondary | ICD-10-CM | POA: Diagnosis not present

## 2021-07-04 DIAGNOSIS — I700 Atherosclerosis of aorta: Secondary | ICD-10-CM | POA: Diagnosis not present

## 2021-07-04 DIAGNOSIS — K7689 Other specified diseases of liver: Secondary | ICD-10-CM | POA: Diagnosis not present

## 2021-07-04 DIAGNOSIS — I871 Compression of vein: Secondary | ICD-10-CM

## 2021-07-04 DIAGNOSIS — M7989 Other specified soft tissue disorders: Secondary | ICD-10-CM | POA: Diagnosis not present

## 2021-07-04 MED ORDER — IOPAMIDOL (ISOVUE-300) INJECTION 61%
125.0000 mL | Freq: Once | INTRAVENOUS | Status: AC | PRN
  Administered 2021-07-04: 125 mL via INTRAVENOUS

## 2021-07-08 ENCOUNTER — Telehealth: Payer: Self-pay | Admitting: Pulmonary Disease

## 2021-07-08 NOTE — Telephone Encounter (Signed)
Pt is having difficulty breathing. Wanted appt with [facility] today but nothing available until 6/8. Primary Pulmonologist: [name]  Last office visit and with whom: 05/03/2021 [name]  What do we see them for (pulmonary problems): Chronic hypoxemic respiratory failure ([place]) COPD  Last OV assessment/plan: see below   Was appointment offered to patient (explain)?  Offered patient appt with [name] tomorrow at 8:45 but she declined since she prefers to have appts later in the day. No other openings available    Reason for call: Patient states she has been very SOB lately and that she is having a hard time walking around and doing normal activities without getting SOB. She states she is having a hard time making it to the bathroom in time and lifting objects because of her SOB and that she has been using inhalers as prescribed. She is still using 2LO2 prn to maintain sats above 90%   [name] please advise   Allergies  Allergen Reactions   Losartan Other (See Comments)    hyperkalemia   Penicillins Hives, Swelling and Rash    Has patient had a PCN reaction causing immediate rash, facial/tongue/throat swelling, SOB or lightheadedness with hypotension: YES Has patient had a PCN reaction causing severe rash involving mucus membranes or skin necrosis: NO Has patient had a PCN reaction that required hospitalization NO Has patient had a PCN reaction occurring within the last 10 years: NO If all of the above answers are "NO", then may proceed with Cephalosporin use.    Cefixime [Kdc:Cefixime] Other (See Comments)    unspecified   Gabapentin Other (See Comments)    Feels woozy   Indocin [Indomethacin] Other (See Comments)    Painful tongue and throat   Singulair [Montelukast Sodium]     Chest tightness, decreased mental clarity   Sulfa Antibiotics Other (See Comments)    unspecified   Ace Inhibitors Other (See Comments)    cough   Statins Other (See Comments)    Myalgias     Immunization  History  Administered Date(s) Administered   Fluad Quad(high Dose 65+) 10/05/2018, 10/25/2019, 12/07/2020   Influenza Split 11/04/2010, 10/23/2011   Influenza Whole 10/04/2009   Influenza, High Dose Seasonal PF 11/07/2016, 11/04/2017   Influenza,inj,Quad PF,6+ Mos 10/07/2012, 10/11/2014   Influenza-Unspecified 11/07/2013, 11/01/2015   Moderna Sars-Covid-2 Vaccination 02/14/2019, 03/15/2019, 12/15/2019   PNEUMOCOCCAL CONJUGATE-20 07/17/2020   Pfizer Covid-19 Vaccine Bivalent Booster 46yr & up 12/19/2020   Pneumococcal Conjugate-13 04/29/2013   Pneumococcal Polysaccharide-23 02/03/2005, 11/28/2010, 11/01/2018   Td 02/04/2008, 03/07/2010   Tdap 03/07/2020   Zoster Recombinat (Shingrix) 10/30/2016, 12/29/2016   Zoster, Live 02/10/2013    Assessment/Plan:    GOLD 4 COPD with chronic bronchitis. - continue trelegy and daliresp - prn albuterol   Allergic rhinitis with upper airway cough. - prn zyrtec, azelastine, flonase   Chronic respiratory failure with hypoxia. - goal SpO2 > 90% - she uses 2 liters at night and prn during the day - advised her to monitor her SpO2 while exerting to determine if her levels are running low with activity   Lt leg swelling, dark colored stool. - she has follow up with PCP next week   Time Spent Involved in Patient Care on Day of Examination:  35 minutes   Follow up:    Patient Instructions  Follow up in 6 months   Medication List:    Allergies as of 05/03/2021         Reactions    Losartan  Other (See Comments)    hyperkalemia    Penicillins Hives, Swelling, Rash    Has patient had a PCN reaction causing immediate rash, facial/tongue/throat swelling, SOB or lightheadedness with hypotension: YES Has patient had a PCN reaction causing severe rash involving mucus membranes or skin necrosis: NO Has patient had a PCN reaction that required hospitalization NO Has patient had a PCN reaction occurring within the last 10 years: NO If all of the  above answers are "NO", then may proceed with Cephalosporin use.    Cefixime [kdc:cefixime] Other (See Comments)    unspecified    Gabapentin Other (See Comments)    Feels woozy    Indocin [indomethacin] Other (See Comments)    Painful tongue and throat    Singulair [montelukast Sodium]      Chest tightness, decreased mental clarity    Sulfa Antibiotics Other (See Comments)    unspecified    Ace Inhibitors Other (See Comments)    cough    Statins Other (See Comments)    Myalgias            Medication List           Accurate as of May 03, 2021  1:26 PM. If you have any questions, ask your nurse or doctor.              albuterol 108 (90 Base) MCG/ACT inhaler Commonly known as: ProAir HFA Inhale 2 puffs into the lungs every 4 (four) hours as needed for wheezing or shortness of breath.    alendronate 70 MG tablet Commonly known as: FOSAMAX TAKE 1 TABLET(70 MG) BY MOUTH 1 TIME A WEEK WITH A FULL GLASS OF WATER AND ON AN EMPTY STOMACH    amLODipine 10 MG tablet Commonly known as: NORVASC Take 1 tablet (10 mg total) by mouth daily.    aspirin EC 81 MG tablet Take 81 mg by mouth daily.    azelastine 0.1 % nasal spray Commonly known as: ASTELIN Place 2 sprays into both nostrils 2 (two) times daily. Use in each nostril as directed    cetirizine 10 MG tablet Commonly known as: ZYRTEC Take 1 tablet (10 mg total) by mouth daily.    clobetasol 0.05 % external solution Commonly known as: TEMOVATE APPLY TO THE AFFECTED AREA EVERY DAY    diazepam 5 MG tablet Commonly known as: VALIUM TAKE 1 TABLET BY MOUTH DAILY AS NEEDED FOR ANXIETY AND INSOMNIA    fluorometholone 0.1 % ophthalmic suspension Commonly known as: FML Place 1 drop into both eyes as directed. Tapering off    fluticasone 50 MCG/ACT nasal spray Commonly known as: FLONASE Place 2 sprays into both nostrils daily.    furosemide 20 MG tablet Commonly known as: LASIX TAKE 1 TABLET BY MOUTH EVERY DAY AS  NEEDED FOR INCREASED FLUID COLLECTION IN LEGS    Gemtesa 75 MG Tabs Generic drug: Vibegron Take 1 capsule by mouth daily.    nitrofurantoin 50 MG capsule Commonly known as: MACRODANTIN TAKE 1 CAPSULE(50 MG) BY MOUTH AT BEDTIME    nitrofurantoin 50 MG capsule Commonly known as: MACRODANTIN Take 1 capsule (50 mg total) by mouth at bedtime.    PROBIOTIC DAILY PO Take 1 capsule by mouth daily.    RESTASIS OP Apply to eye.    roflumilast 500 MCG Tabs tablet Commonly known as: DALIRESP Take 1 tablet (500 mcg total) by mouth daily.    Soothe XP Soln Place 1 drop into both eyes 2 (two) times daily as  needed (irritation).    Synthroid 88 MCG tablet Generic drug: levothyroxine TAKE 1 TABLET(88 MCG) BY MOUTH DAILY    Trelegy Ellipta 100-62.5-25 MCG/ACT Aepb Generic drug: Fluticasone-Umeclidin-Vilant Inhale 1 puff into the lungs daily.    triamcinolone cream 0.1 % Commonly known as: KENALOG Apply 1 application topically 2 (two) times daily.    venlafaxine 50 MG tablet Commonly known as: EFFEXOR Take 1 tablet (50 mg total) by mouth 2 (two) times daily.    Visine 0.05 % ophthalmic solution Generic drug: tetrahydrozoline 1 drop  as needed    vitamin B-12 100 MCG tablet Commonly known as: CYANOCOBALAMIN Take 100 mcg by mouth daily.    VITAMIN D PO Take by mouth.    Xiidra 5 % Soln Generic drug: Lifitegrast Apply to eye in the morning and at bedtime.

## 2021-07-08 NOTE — Telephone Encounter (Signed)
Called and spoke to patient and she was agreeable to coming into office tomorrow afternoon. Patient is scheduled for 2:30. Nothing further needed.

## 2021-07-08 NOTE — Telephone Encounter (Signed)
Please schedule her for afternoon appointment with me in [place] office on 07/09/21.

## 2021-07-09 ENCOUNTER — Encounter: Payer: Self-pay | Admitting: Pulmonary Disease

## 2021-07-09 ENCOUNTER — Ambulatory Visit (INDEPENDENT_AMBULATORY_CARE_PROVIDER_SITE_OTHER): Payer: Medicare Other | Admitting: Pulmonary Disease

## 2021-07-09 VITALS — BP 124/68 | HR 74 | Temp 97.9°F | Ht 60.0 in | Wt 125.6 lb

## 2021-07-09 DIAGNOSIS — J432 Centrilobular emphysema: Secondary | ICD-10-CM

## 2021-07-09 DIAGNOSIS — J9611 Chronic respiratory failure with hypoxia: Secondary | ICD-10-CM | POA: Diagnosis not present

## 2021-07-09 NOTE — Patient Instructions (Signed)
Will arrange for overnight oxygen test with 2 liters  Try using the spacer device with your albuterol inhaler  Follow up in 2 months

## 2021-07-09 NOTE — Progress Notes (Signed)
[facility]  Chief Complaint  Patient presents with   Acute Visit    Patient had a fall today at work at 11:00 and hit her ribs  And has SOB has been worsening since last visit but is noticing more lately.     Constitutional:  BP 124/68 (BP Location: Left Arm, Patient Position: Sitting)   Pulse 74   Temp 97.9 F (36.6 C) (Temporal)   Ht 5' (1.524 m)   Wt 125 lb 9.6 oz (57 kg)   SpO2 93% Comment: ra  BMI 24.53 kg/m   Past Medical History:  Chronic cystitis, DDD, Anxiety, Depression, C diff 2017 and 2018, Diverticulosis, GERD, HTN, Hypothyroidism, PNA, Shingles, Spinal stenosis  Past Surgical History:  She  has a past surgical history that includes Cholecystectomy (2001); Tubal ligation (1974); Abdominal hysterectomy (1978); Breast cyst excision; Appendectomy; Tonsillectomy; Colonoscopy (04/2005; 12/06/2015); transthoracic echocardiogram (08/2014); Total hip arthroplasty (Right, 11/01/2014); Hip Closed Reduction (Right, 12/25/2015); DEXA (05/18/2017; 08/7019); Total hip arthroplasty (Left, 12/08/2017); and NCV/EMS (10/2018).  Brief Summary:  Priscilla Cantrell is a 86 y.o. female former smoker with GOLD 4 COPD, ACE cough, and nocturnal hypoxemia.       Subjective:   CXR from 06/07/21 showed atelectasis.  Echo from 06/21/21 was relatively unremarkable.  She was found to have thyroid dysfunction.  She wasn't taking thyroid supplemental correctly.  She had her dose increase and is taking as directed now.  She was having leg swelling.  This has improved with diuretic therapy.  Doppler of leg was negative for DVT.  She can't walk up a hill anymore.  She gets short of breath if she tries to lift anything.  When this happens she gets the sudden urge to pass urine that she can't control.  She is not having cough, or wheeze.  She gets sinus congestion and post nasal drip.  Feels a globus sensation.  This happens more after she wakes up.    She  doesn't feel like her current albuterol inhaler works as well as her old inhalers.    She is using 2 liters oxygen at night.  SpO2 on room air with activity today got down to 89% on 3 rd lap.  She got tripped at work earlier this morning and fell on her right side.  She has some soreness, but not bruising.  Physical Exam:   Appearance - well kempt   ENMT - no sinus tenderness, no oral exudate, no LAN, Mallampati 2 airway, no stridor  Respiratory - equal breath sounds bilaterally, no wheezing or rales  CV - s1s2 regular rate and rhythm, no murmurs  Ext - no clubbing, no edema  Skin - no rashes  Psych - normal mood and affect      Pulmonary testing:  Spirometry 01/10/09>>FEV1 0.97(52%), FEV1% 47 March 2012>>Daliresp started RAST 05/04/17 >> negative, IgE 8 Spirometry 05/14/17 >> FEV1 0.91 (55%), FEV1% 48 PFT 10/29/18 >> FEV1 1.05 (68%), FEV1% 52, TLC 4.29 (96%), DLCO 44%  Sleep Tests:  PSG 11/14/11 >> AHI 0.3  ONO with RA 07/31/16 >> test time 6 hrs 41 min.  Average SpO2 87%, low SpO2 74%.  Spent 4 hrs 13 min with SpO2 < 88%.  Cardiac Tests:  Echo 06/21/21 >> EF 60 to 65%, grade 1 DD, mild elevation in PASP, mild LA dilation  Social History:  She  reports that she quit smoking about 31 years ago. Her smoking use included cigarettes. She started smoking about 65  years ago. She has a 51.00 pack-year smoking history. She has never used smokeless tobacco. She reports current alcohol use. She reports that she does not use drugs.  Family History:  Her family history includes Cancer in her maternal aunt and paternal grandfather; Cirrhosis in her father; Diabetes in her daughter; Goiter in her mother; Hernia in her paternal grandmother; Hypertension in her paternal grandmother; Psoriasis in her father; Stroke in her maternal grandfather and paternal grandmother.     Discussion:  She has progressive dyspnea on exertion.  I think this is likely from natural progression of her already  advanced COPD.  Worsening hypothyroidism likely always was contributing to some degree, but this should have improved with improved thyroid function.  I also think that she has episodes of exertional hypoxemia that is contributing to her activity intolerance.  Assessment/Plan:   GOLD 4 COPD with chronic bronchitis. - continue trelegy and daliresp - will arrange for spacer device to use with her albuterol - if her symptoms continue to progress, then could try changing to maintenance nebulizer medication with pulmicort/yupelri/perforomist  Allergic rhinitis with upper airway cough. - prn zyrtec, azelastine, flonase   Chronic respiratory failure with hypoxia. - goal SpO2 > 90% - will arrange for ONO with 2 liters to determine if she needs to be on higher level of supplemental oxygen at night - advised her to use her oxygen with activity on more regular basis during the day  Time Spent Involved in Patient Care on Day of Examination:  37 minutes  Follow up:   Patient Instructions  Will arrange for overnight oxygen test with 2 liters  Try using the spacer device with your albuterol inhaler  Follow up in 2 months  Medication List:   Allergies as of 07/09/2021       Reactions   Losartan Other (See Comments)   hyperkalemia   Penicillins Hives, Swelling, Rash   Has patient had a PCN reaction causing immediate rash, facial/tongue/throat swelling, SOB or lightheadedness with hypotension: YES Has patient had a PCN reaction causing severe rash involving mucus membranes or skin necrosis: NO Has patient had a PCN reaction that required hospitalization NO Has patient had a PCN reaction occurring within the last 10 years: NO If all of the above answers are "NO", then may proceed with Cephalosporin use.   Cefixime [kdc:cefixime] Other (See Comments)   unspecified   Gabapentin Other (See Comments)   Feels woozy   Indocin [indomethacin] Other (See Comments)   Painful tongue and throat    Singulair [montelukast Sodium]    Chest tightness, decreased mental clarity   Sulfa Antibiotics Other (See Comments)   unspecified   Ace Inhibitors Other (See Comments)   cough   Statins Other (See Comments)   Myalgias        Medication List        Accurate as of July 09, 2021  3:53 PM. If you have any questions, ask your nurse or doctor.          albuterol 108 (90 Base) MCG/ACT inhaler Commonly known as: ProAir HFA Inhale 2 puffs into the lungs every 4 (four) hours as needed for wheezing or shortness of breath.   alendronate 70 MG tablet Commonly known as: FOSAMAX TAKE 1 TABLET(70 MG) BY MOUTH 1 TIME A WEEK WITH A FULL GLASS OF WATER AND ON AN EMPTY STOMACH   aspirin EC 81 MG tablet Take 81 mg by mouth daily.   azelastine 0.1 % nasal spray Commonly known  as: ASTELIN Place 2 sprays into both nostrils 2 (two) times daily. Use in each nostril as directed   cetirizine 10 MG tablet Commonly known as: ZYRTEC Take 1 tablet (10 mg total) by mouth daily.   clobetasol 0.05 % external solution Commonly known as: TEMOVATE APPLY TO THE AFFECTED AREA EVERY DAY   diazepam 5 MG tablet Commonly known as: VALIUM TAKE 1 TABLET BY MOUTH DAILY AS NEEDED FOR ANXIETY AND INSOMNIA   fluticasone 0.05 % cream Commonly known as: CUTIVATE Apply topically 2 (two) times daily.   fluticasone 50 MCG/ACT nasal spray Commonly known as: FLONASE Place 2 sprays into both nostrils daily.   furosemide 20 MG tablet Commonly known as: LASIX 2 tabs qd alt with 1 tab qd   Gemtesa 75 MG Tabs Generic drug: Vibegron Take 1 capsule by mouth daily.   levothyroxine 100 MCG tablet Commonly known as: SYNTHROID Take 1 tablet (100 mcg total) by mouth daily.   nitrofurantoin 50 MG capsule Commonly known as: MACRODANTIN TAKE 1 CAPSULE(50 MG) BY MOUTH AT BEDTIME   PROBIOTIC DAILY PO Take 1 capsule by mouth daily.   RESTASIS OP Apply to eye.   roflumilast 500 MCG Tabs tablet Commonly known as:  DALIRESP Take 1 tablet (500 mcg total) by mouth daily.   Soothe XP Soln Place 1 drop into both eyes 2 (two) times daily as needed (irritation).   spironolactone 25 MG tablet Commonly known as: ALDACTONE 1.5 tab po bid   Trelegy Ellipta 100-62.5-25 MCG/ACT Aepb Generic drug: Fluticasone-Umeclidin-Vilant Inhale 1 puff into the lungs daily.   triamcinolone cream 0.1 % Commonly known as: KENALOG Apply 1 application topically 2 (two) times daily.   venlafaxine 50 MG tablet Commonly known as: EFFEXOR Take 1 tablet (50 mg total) by mouth 2 (two) times daily.   Visine 0.05 % ophthalmic solution Generic drug: tetrahydrozoline 1 drop  as needed   vitamin B-12 100 MCG tablet Commonly known as: CYANOCOBALAMIN Take 100 mcg by mouth daily.   VITAMIN D PO Take by mouth.   Xiidra 5 % Soln Generic drug: Lifitegrast Apply to eye in the morning and at bedtime.        Signature:  Chesley Mires, MD [place] Pager - [phone] 07/09/2021, 3:53 PM

## 2021-07-10 ENCOUNTER — Ambulatory Visit: Payer: Medicare Other

## 2021-07-12 ENCOUNTER — Ambulatory Visit: Payer: Medicare Other | Admitting: Family Medicine

## 2021-07-24 ENCOUNTER — Encounter: Payer: Self-pay | Admitting: Pulmonary Disease

## 2021-07-24 DIAGNOSIS — R0683 Snoring: Secondary | ICD-10-CM | POA: Diagnosis not present

## 2021-07-24 DIAGNOSIS — G4730 Sleep apnea, unspecified: Secondary | ICD-10-CM | POA: Diagnosis not present

## 2021-07-25 ENCOUNTER — Ambulatory Visit (INDEPENDENT_AMBULATORY_CARE_PROVIDER_SITE_OTHER): Payer: Medicare Other | Admitting: Family Medicine

## 2021-07-25 ENCOUNTER — Encounter: Payer: Self-pay | Admitting: Family Medicine

## 2021-07-25 VITALS — BP 151/72 | HR 64 | Temp 97.7°F | Ht 60.0 in | Wt 124.0 lb

## 2021-07-25 DIAGNOSIS — S2241XD Multiple fractures of ribs, right side, subsequent encounter for fracture with routine healing: Secondary | ICD-10-CM

## 2021-07-25 NOTE — Progress Notes (Signed)
OFFICE VISIT  07/25/2021  CC:  Chief Complaint  Patient presents with   Follow-up    Urgent care; rib fracture. Has been using asper cream     Patient is a 86 y.o. female who presents for rib fractures.  HPI: [facility] after having right chest wall pain after a fall at work. An x-ray there showed " abrupt angulation of the anterior sixth, seventh, and eighth ribs with overlying pleural opacity, likely hematoma". No pleural effusion or pneumothorax.  The lungs were clear.  Heart size and mediastinal contours wnl.  Patient states that on 07/11/2021 she was at work and was standing close to a coworker and her foot caught on his and she fell over onto a countertop.  She hit her right posterolateral chest wall and bumped her head.  She says the head bump was mild.  No dizziness, headache or loss of consciousness. Her chest wall did not hurt that much until the next morning so she then went to the urgent care as stated above. Since that time she has taken some Tylenol as needed for pain and this has helped significantly.  No troubles taking a deep breath, no shortness of breath, no cough, no fever. She does say that she has an open Worker's Comp. Case.  No neck pain.  No back pain.      Past Medical History:  Diagnosis Date   Allergic rhinitis    with upper airway cough: as of 07/2017 pulm f/u she was instructed to use astelin, flonase, and saline more consistently   BPPV (benign paroxysmal positional vertigo) 03/2018   Chest pain, non-cardiac    Cardiac CT showed no signif obstructive dz, mildly elevated calcium level (Dr. [name]).   Chronic cystitis with hematuria    [name]   Chronic hypoxemic respiratory failure ([place]) 02/02/2015   2L oxygen 24/7   Chronic renal insufficiency, stage 2 (mild)    GFR approx 60   Cold hands    NCS/EMGs normal.  Hand dysesthesias per neuro--reassured   COPD (chronic obstructive pulmonary disease) ([place])     GOLD 4.  spirometry 01/10/09 FEV 0.97(52%), FEV1% 47.  With chronic bronchitis as of 07/2017.   DDD (degenerative disc disease), cervical    1998 MRI C5-6 impingemt (left).  MRI 2021 (neurol) 1 level canal stenosis, 1 level foraminal stenosis--->PT   DDD (degenerative disc disease), lumbar    Depression with anxiety 04/15/2011   Diarrheal disease Summer 2017   ? 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).  As of 10/05/15 pt reports GI dx'd her with C diff and rx'd more flagyl and she is also on cholestyramine.  As of 02/2016, pt's sx's resolved completely.   Diverticulosis of colon    Procto '97 and colonoscopy 2002   Dysplastic nevus of upper extremity 04/2014   R tricep ([name])   Edema of both lower extremities due to peripheral venous insufficiency    Varicosities bilat, swelling L>R.  R baker's cyst.  Got vein clinic eval 02/2018->sclerotherapy recommended but since no hemorrhage or ulceration, insurance will not cover the procedure.   Family history of adverse reaction to anesthesia    mother died when patient age 69 receiving anesthesia for thyroid surgery   Fibrocystic breast disease    w/fibroadenoma.  Bx's showed NO atypia (Dr. Margot Chimes).  Mammo neg 2008.   GERD (gastroesophageal reflux disease)  History of double vision    Ophthalmologist, Dr. Herbert Deaner, is further evaluating this with MRI  orbits and limited brain (myesthenia gravis testing neg 07/2010)   History of home oxygen therapy    uses 2 liters at hs   Hypertension    EKG 03/2010 normal   Hypothyroidism    Hashimoto's, dx'd 1989   Idiopathic peripheral neuropathy 04/2018   Sensory (numb feet) S1 distribution bilat, c/w spinal stenosis sensory neuropathy (no pain). 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  Labs [place], MRI C spine->some spondylosis/stenosis.  UE  NCS/EMS: normal 04/2019.   Lesion of right native kidney 2017   found on CT 02/2015.  F/u MRI 03/31/15 showed it to be smaller and less likely of any significance but repeat CT renal protocol in [place]mow[place] recommended by radiology.  This was done 10/26/15 and showed no interval change in the lesion, but b/c the lesion enhances with contrast, radiol rec'd urol consult (b/c pap renal RCC could not be excluded).  Saw urol 03/06/16--stable on u/s 09/2016 and 09/2017.   OAB (overactive bladder)    Osteoarthritis, hip, bilateral    she is s/p bilat THA as of 02/2018.   Osteoporosis    radius, T score -2.5->rec'd alendronate 08/2019->plan rpt DEXA 08/2021.   Pneumonia    Postmenopausal atrophic vaginitis    improved with estrace 2 X/week.   Prolapse of vaginal cuff after hysterectomy    10/2018: Dr. FWayne Both Mild colpocele 04/2019 per GYN (no surgery)   Pulmonary nodule    CT chest 12/10, June 2011.  No nodule on CT 06/2010.   Recurrent Clostridium difficile diarrhea    Episode 09/19/2016: resolved with prolonged course of flagyl.  11/2016 recurrence treated with 10d of Dificid (Fidaxomicin) by GI (Dr. MEarlean Shawl.   Recurrent UTI    likely related to pt's atrophic vaginitis.  Urol started vaginal estrace 03/2016.   Rib fracture 09/2018   R 7th, laterally-->sustained when her dog pulled her over and she fell.   RLS (restless legs syndrome)    neuro->gabapentin trial 2020/21   Shingles 02/2014   R abd/flank   Spinal stenosis of lumbar region with neurogenic claudication    2008 MRI (L3-4, L4-5, L5-S1).  +S1 distribution sensory loss bilat    Past Surgical History:  Procedure Laterality Date   ABDOMINAL HYSTERECTOMY  1978   no BSO per pt--nonmalignant reasons   APPENDECTOMY     BREAST CYST EXCISION     Last screening mammogram 10/2010 was normal.   CHOLECYSTECTOMY  2001   COLONOSCOPY  04/2005; 12/06/2015   2007 NORMAL.  2017 adenomatous polyp x 1.  Mod sigmoid diverticulosis (Dr. MEarlean Shawl.     DEXA  05/18/2017;  08/7019   2019 NORMAL (T-score 0.1).  08/2019->T score -2.5->alendronate started->plan Rpt 08/2021.   HIP CLOSED REDUCTION Right 12/25/2015   Procedure: CLOSED REDUCTION HIP;  Surgeon: JNicholes Stairs MD;  Location: [facility];  Service: Orthopedics;  Laterality: Right;   NCV/EMS  10/2018   chronic and symmetric sensorimotor axonal polyneuropathy affecting the lower extremities   TONSILLECTOMY     TOTAL HIP ARTHROPLASTY Right 11/01/2014   Procedure: RIGHT TOTAL HIP ARTHROPLASTY;  Surgeon: RLatanya Maudlin MD;  Location: [facility];  Service: Orthopedics;  Laterality: Right;   TOTAL HIP ARTHROPLASTY Left 12/08/2017   Procedure: LEFT TOTAL HIP ARTHROPLASTY ANTERIOR APPROACH;  Surgeon: OParalee Cancel MD;  Location: [facility];  Service: Orthopedics;  Laterality: Left;  70 mins   TRANSTHORACIC ECHOCARDIOGRAM  08/2014   2016 EF 60-65%, grade I diast dysfxn. 06/21/21 unchanged.   TUBAL LIGATION  1974    Outpatient Medications Prior to Visit  Medication Sig Dispense Refill   albuterol (PROAIR HFA) 108 (90 Base) MCG/ACT inhaler Inhale 2 puffs into the lungs every 4 (four) hours as needed for wheezing or shortness of breath. 8.5 g 1   alendronate (FOSAMAX) 70 MG tablet TAKE 1 TABLET(70 MG) BY MOUTH 1 TIME A WEEK WITH A FULL GLASS OF WATER AND ON AN EMPTY STOMACH 12 tablet 3   Artificial Tear Solution (SOOTHE XP) SOLN Place 1 drop into both eyes 2 (two) times daily as needed (irritation).     aspirin EC 81 MG tablet Take 81 mg by mouth daily.     azelastine (ASTELIN) 0.1 % nasal spray Place 2 sprays into both nostrils 2 (two) times daily. Use in each nostril as directed 30 mL 3   cetirizine (ZYRTEC) 10 MG tablet Take 1 tablet (10 mg total) by mouth daily. 30 tablet 6   clobetasol (TEMOVATE) 0.05 % external solution APPLY TO THE AFFECTED AREA EVERY DAY 50 mL 4   cycloSPORINE (RESTASIS OP) Apply to eye.     diazepam (VALIUM) 5 MG tablet TAKE 1 TABLET BY MOUTH DAILY AS NEEDED FOR ANXIETY AND INSOMNIA 30 tablet 5    fluticasone (CUTIVATE) 0.05 % cream Apply topically 2 (two) times daily. 30 g 1   fluticasone (FLONASE) 50 MCG/ACT nasal spray Place 2 sprays into both nostrils daily. 16 g 6   Fluticasone-Umeclidin-Vilant (TRELEGY ELLIPTA) 100-62.5-25 MCG/ACT AEPB Inhale 1 puff into the lungs daily. 180 each 3   furosemide (LASIX) 20 MG tablet 2 tabs qd alt with 1 tab qd 90 tablet 1   levothyroxine (SYNTHROID) 100 MCG tablet Take 1 tablet (100 mcg total) by mouth daily. 30 tablet 2   Lifitegrast (XIIDRA) 5 % SOLN Apply to eye in the morning and at bedtime.     nitrofurantoin (MACRODANTIN) 50 MG capsule TAKE 1 CAPSULE(50 MG) BY MOUTH AT BEDTIME 90 capsule 3   Probiotic Product (PROBIOTIC DAILY PO) Take 1 capsule by mouth daily.      roflumilast (DALIRESP) 500 MCG TABS tablet Take 1 tablet (500 mcg total) by mouth daily. 90 tablet 3   spironolactone (ALDACTONE) 25 MG tablet 1.5 tab po bid 90 tablet 1   triamcinolone cream (KENALOG) 0.1 % Apply 1 application topically 2 (two) times daily. 45 g 2   venlafaxine (EFFEXOR) 50 MG tablet Take 1 tablet (50 mg total) by mouth 2 (two) times daily. 180 tablet 3   Vibegron (GEMTESA) 75 MG TABS Take 1 capsule by mouth daily. 30 tablet 11   VISINE 0.05 % ophthalmic solution 1 drop  as needed     vitamin B-12 (CYANOCOBALAMIN) 100 MCG tablet Take 100 mcg by mouth daily.     VITAMIN D PO Take by mouth.     No facility-administered medications prior to visit.    Allergies  Allergen Reactions   Losartan Other (See Comments)    hyperkalemia   Penicillins Hives, Swelling and Rash    Has patient had a PCN reaction causing immediate rash, facial/tongue/throat swelling, SOB or lightheadedness with hypotension: YES Has patient had a PCN reaction causing severe rash involving mucus membranes or skin necrosis: NO Has patient had a PCN reaction that required hospitalization NO Has patient had a PCN reaction occurring within the last 10 years: NO If all of the above answers are  "NO", then  may proceed with Cephalosporin use.    Cefixime [Kdc:Cefixime] Other (See Comments)    unspecified   Gabapentin Other (See Comments)    Feels woozy   Indocin [Indomethacin] Other (See Comments)    Painful tongue and throat   Singulair [Montelukast Sodium]     Chest tightness, decreased mental clarity   Sulfa Antibiotics Other (See Comments)    unspecified   Ace Inhibitors Other (See Comments)    cough   Statins Other (See Comments)    Myalgias     ROS As per HPI  PE:    07/25/2021    2:00 PM 07/25/2021    1:54 PM 07/09/2021    2:42 PM  Vitals with BMI  Height  '5\' 0"'$  '5\' 0"'$   Weight  124 lbs 125 lbs 10 oz  BMI  38.10 17.51  Systolic 025 852 778  Diastolic 72 90 68  Pulse  64 74     Physical Exam  General: Alert and well-appearing.  No tenderness to palpation of scalp or skull. She has some focal tenderness to palpation about softball sized in the posterolateral ribs.  No crepitus or step-off.  No bruising or hematoma. Neck range of motion is fully intact and without pain. She has some mild pain with lateral bending and rotation of T-spine No spinal tenderness. Upper extremity strength 5 out of 5 proximally and distally bilaterally   LABS:  Last CBC Lab Results  Component Value Date   WBC 6.9 06/07/2021   HGB 13.0 06/07/2021   HCT 40.0 06/07/2021   MCV 91.5 06/07/2021   MCH 29.7 06/07/2021   RDW 14.0 06/07/2021   PLT 254 24/23/5361   Last metabolic panel Lab Results  Component Value Date   GLUCOSE 83 07/03/2021   NA 142 07/03/2021   K 3.8 07/03/2021   CL 106 07/03/2021   CO2 25 07/03/2021   BUN 16 07/03/2021   CREATININE 0.98 07/03/2021   GFRNONAA 46 (L) 06/07/2021   CALCIUM 9.5 07/03/2021   PHOS 3.8 01/31/2013   PROT 6.6 06/28/2021   ALBUMIN 4.5 05/09/2020   BILITOT 0.6 06/28/2021   ALKPHOS 105 05/09/2020   AST 16 06/28/2021   ALT 13 06/28/2021   ANIONGAP 12 06/07/2021   IMPRESSION AND PLAN:  Rib fractures.  She is 2 weeks out  from fracture in the posterolateral region of sixth, seventh, and eighth ribs.  She is doing very well, just requiring some Tylenol for pain. Discussed expected healing duration of approximately 6 to 8 weeks. 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  An After Visit Summary was printed and given to the patient.  FOLLOW UP: No follow-ups on file.  Signed:  Crissie Sickles, MD           07/25/2021

## 2021-07-26 ENCOUNTER — Telehealth: Payer: Self-pay | Admitting: Pulmonary Disease

## 2021-07-26 ENCOUNTER — Encounter: Payer: Self-pay | Admitting: Gastroenterology

## 2021-07-26 NOTE — Telephone Encounter (Signed)
ATC patient. Left detailed message (ok per dpr) with results of "oxygen level is good with 2LO2 at night" and advised patient to call back if she wanted more details from the study. Nothing further needed at this time. Will addend encounter if patient returns call.

## 2021-08-02 ENCOUNTER — Other Ambulatory Visit: Payer: Self-pay

## 2021-08-02 MED ORDER — FUROSEMIDE 20 MG PO TABS: ORAL_TABLET | ORAL | 1 refills | Status: DC

## 2021-08-07 ENCOUNTER — Other Ambulatory Visit: Payer: Self-pay | Admitting: *Deleted

## 2021-08-07 DIAGNOSIS — I872 Venous insufficiency (chronic) (peripheral): Secondary | ICD-10-CM | POA: Diagnosis not present

## 2021-08-08 ENCOUNTER — Telehealth: Payer: Self-pay | Admitting: Pulmonary Disease

## 2021-08-08 MED ORDER — ALBUTEROL SULFATE HFA 108 (90 BASE) MCG/ACT IN AERS: 2.0000 | INHALATION_SPRAY | RESPIRATORY_TRACT | 1 refills | Status: DC | PRN

## 2021-08-08 NOTE — Telephone Encounter (Signed)
Albuterol inhaler refilled.    Called and notified patient.  Nothing further needed.

## 2021-08-09 NOTE — Patient Instructions (Signed)
Visit Information  Thank you for taking time to visit with me today. Please don't hesitate to contact me if I can be of assistance to you before our next scheduled telephone appointment.  Following are the goals we discussed today:  Current Barriers:  Knowledge Deficits related to plan of care for management of HTN and COPD   RNCM Clinical Goal(s):  Patient will verbalize understanding of plan for management of HTN and COPD as evidenced by continuation of monitoring blood pressure and triggers for COPD exacerbation through collaboration with RN Care manager, provider, and care team.   Interventions: Inter-disciplinary care team collaboration (see longitudinal plan of care) Evaluation of current treatment plan related to  self management and patient's adherence to plan as established by provider  Patient Goals/Self-Care Activities: Take medications as prescribed   Attend all scheduled provider appointments Call pharmacy for medication refills 3-7 days in advance of running out of medications Attend church or other social activities Perform all self care activities independently  Perform IADL's (shopping, preparing meals, housekeeping, managing finances) independently Call provider office for new concerns or questions  call the Suicide and Crisis Lifeline: 988 if experiencing a Mental Health or [place]  - avoid second hand smoke - identify and avoid work-related triggers - identify and remove indoor air pollutants - limit outdoor activity during cold weather - listen for public air quality announcements every day - develop a rescue plan - eliminate symptom triggers at home - follow rescue plan if symptoms flare-up - keep follow-up appointments: PCP and eye Dr - eat healthy/prescribed diet: Low sodium diet check blood pressure weekly choose a place to take my blood pressure (home, clinic or office, retail store) write blood pressure results in a log or diary learn  about high blood pressure take blood pressure log to all doctor appointments call doctor for signs and symptoms of high blood pressure keep all doctor appointments report new symptoms to your doctor limit salt intake to 2,300 mg/day  [id number] Per patient she has some shortness of breath on exertion.She had a recent fall and injured ribs. Patient is monitoring her O2 SATs. She is using the oxygen at hs.   Plan: patient will take medications as inhaler.  Patient will monitor for symptoms of COPD exacerbation.   Our next appointment is by telephone on September 30, 2021  Please call Johny Shock [phone]  if you need to cancel or reschedule your appointment.   Please call the Suicide and Crisis Lifeline: 988 if you are experiencing a Mental Health or [place] or need someone to talk to.  The patient verbalized understanding of instructions, educational materials, and care plan provided today and agreed to receive a mailed copy of patient instructions, educational materials, and care plan.   Telephone follow up appointment with care management team member scheduled for: The patient has been provided with contact information for the care management team and has been advised to call with any health related questions or concerns.   [place] [facility] [phone]

## 2021-08-09 NOTE — Patient Outreach (Signed)
Priscilla Cantrell [facility]) Care Management [place] Note   08/09/2021 Name:  Priscilla Cantrell MRN:  [id number] DOB:  September 26, 1935  Summary: Per patient she has some shortness of breath on exertion. Patient stated she can go only short distances since the fall due to the shortness of breath. She had a recent fall and injured ribs. Patient is monitoring her O2 SATs. She is using the oxygen at hs.    Recommendations/Changes made from today's visit: patient will take medications as inhaler.  Patient will monitor for symptoms of COPD exacerbation.    Subjective: Priscilla Cantrell is an 86 y.o. year old female who is a primary patient of McGowen, Adrian Blackwater, MD. The care management team was consulted for assistance with care management and/or care coordination needs.    RN Health Coach completed Telephone Visit today.   Objective:  Medications Reviewed Today     Reviewed by [name], MD (Physician) on 07/25/21 at 1445  Med List Status: <None>   Medication Order Taking? Sig Documenting Provider Last Dose Status Informant  albuterol (PROAIR HFA) 108 (90 Base) MCG/ACT inhaler [id number] Yes Inhale 2 puffs into the lungs every 4 (four) hours as needed for wheezing or shortness of breath. [name], MD Taking Active   alendronate (FOSAMAX) 70 MG tablet [id number] Yes TAKE 1 TABLET(70 MG) BY MOUTH 1 TIME A WEEK WITH A FULL GLASS OF WATER AND ON AN EMPTY STOMACH McGowen, Adrian Blackwater, MD Taking Active   Artificial Tear Solution (SOOTHE XP) SOLN [id number] Yes Place 1 drop into both eyes 2 (two) times daily as needed (irritation). [provider] Taking Active Self  aspirin EC 81 MG tablet [id number] Yes Take 81 mg by mouth daily. [provider] Taking Active   azelastine (ASTELIN) 0.1 % nasal spray [id number] Yes Place 2 sprays into both nostrils 2 (two) times daily. Use in each nostril as directed McGowen, Adrian Blackwater, MD Taking Active   cetirizine (ZYRTEC) 10 MG tablet  [id number] Yes Take 1 tablet (10 mg total) by mouth daily. [name], MD Taking Active   clobetasol (TEMOVATE) 0.05 % external solution [id number] Yes APPLY TO THE AFFECTED AREA EVERY DAY Warren Danes, PA-C Taking Active   cycloSPORINE (RESTASIS OP) [id number] Yes Apply to eye. [provider] Taking Active   diazepam (VALIUM) 5 MG tablet [id number] Yes TAKE 1 TABLET BY MOUTH DAILY AS NEEDED FOR ANXIETY AND INSOMNIA McGowen, Adrian Blackwater, MD Taking Active   fluticasone (CUTIVATE) 0.05 % cream [id number] Yes Apply topically 2 (two) times daily. [name], MD Taking Active   fluticasone [facility]) 50 MCG/ACT nasal spray [id number] Yes Place 2 sprays into both nostrils daily. [name], MD Taking Active   Fluticasone-Umeclidin-Vilant [facility] ELLIPTA) 100-62.5-25 MCG/ACT AEPB [id number] Yes Inhale 1 puff into the lungs daily. Chesley Mires, MD Taking Active   furosemide (LASIX) 20 MG tablet [id number] Yes 2 tabs qd alt with 1 tab qd [name], MD Taking Active   levothyroxine (SYNTHROID) 100 MCG tablet [id number] Yes Take 1 tablet (100 mcg total) by mouth daily. McGowen, Adrian Blackwater, MD Taking Active   Lifitegrast Shirley Friar) 5 % SOLN [id number] Yes Apply to eye in the morning and at bedtime. [provider] Taking Active   nitrofurantoin (MACRODANTIN) 50 MG capsule [id number] Yes TAKE 1 CAPSULE(50 MG) BY MOUTH AT BEDTIME McGowen, Adrian Blackwater, MD Taking Active   Probiotic Product (PROBIOTIC DAILY PO) [id number] Yes Take 1 capsule by mouth daily.  [provider] Taking Active Self  roflumilast (DALIRESP) 500 MCG TABS tablet [id number] Yes Take 1 tablet (500 mcg total) by mouth daily. Martyn Ehrich, NP Taking Active   spironolactone (ALDACTONE) 25 MG tablet [id number] Yes 1.5 tab po bid McGowen, Adrian Blackwater, MD Taking Active   triamcinolone cream (KENALOG) 0.1 % [id number] Yes Apply 1 application topically 2 (two) times daily. Kuneff, Renee A, DO Taking Active    venlafaxine (EFFEXOR) 50 MG tablet [id number] Yes Take 1 tablet (50 mg total) by mouth 2 (two) times daily. McGowen, Adrian Blackwater, MD Taking Active   Vibegron [facility]) 75 MG TABS [id number] Yes Take 1 capsule by mouth daily. McKenzie, [name], MD Taking Active   VISINE 0.05 % ophthalmic solution [id number] Yes 1 drop  as needed [provider] Taking Active   vitamin B-12 (CYANOCOBALAMIN) 100 MCG tablet [id number] Yes Take 100 mcg by mouth daily. [provider] Taking Active   VITAMIN D PO [id number] Yes Take by mouth. [provider] Taking Active              SDOH:  (Social Determinants of Health) assessments and interventions performed:    Care Plan  Review of patient past medical history, allergies, medications, health status, including review of consultants reports, laboratory and other test data, was performed as part of comprehensive evaluation for care management services.   Care Plan : RN Care Manager Plan of Care  Updates made by Garek Schuneman, Eppie Gibson, RN since 08/09/2021 12:00 AM     Problem: [place] Deficit related to COPD and Care Coordination Needs   Priority: High     Long-Range Goal: Development Plan of Care for Management of  HTN and Copd   Start Date: 12/21/2020  Expected End Date: 02/01/2022  Priority: High  Note:   Current Barriers:  Knowledge Deficits related to plan of care for management of HTN and COPD   RNCM Clinical Goal(s):  Patient will verbalize understanding of plan for management of HTN and COPD as evidenced by continuation of monitoring blood pressure and triggers for COPD exacerbation  through collaboration with RN Care manager, provider, and care team.   Interventions: Inter-disciplinary care team collaboration (see longitudinal plan of care) Evaluation of current treatment plan related to  self management and patient's adherence to plan as established by provider  Patient Goals/Self-Care Activities: Take medications  as prescribed   Attend all scheduled provider appointments Call pharmacy for medication refills 3-7 days in advance of running out of medications Attend church or other social activities Perform all self care activities independently  Perform IADL's (shopping, preparing meals, housekeeping, managing finances) independently Call provider office for new concerns or questions  call the Suicide and Crisis Lifeline: 988 if experiencing a Mental Health or [place]  - avoid second hand smoke - identify and avoid work-related triggers - identify and remove indoor air pollutants - limit outdoor activity during cold weather - listen for public air quality announcements every day - develop a rescue plan - eliminate symptom triggers at home - follow rescue plan if symptoms flare-up - keep follow-up appointments: PCP and eye Dr - eat healthy/prescribed diet: Low sodium diet check blood pressure weekly choose a place to take my blood pressure (home, clinic or office, retail store) write blood pressure results in a log or diary learn about high blood pressure take blood pressure log to all doctor appointments call doctor for signs and symptoms of high blood pressure keep all doctor appointments report  new symptoms to your doctor limit salt intake to 2,300 mg/day  [id number] Per patient she has some shortness of breath on exertion.She had a recent fall and injured ribs. Patient is monitoring her O2 SATs. She is using the oxygen at hs.   Plan: patient will take medications as inhaler.  Patient will monitor for symptoms of COPD exacerbation.       Plan: Telephone follow up appointment with care management team member scheduled for:  September 30, 2021 The patient has been provided with contact information for the care management team and has been advised to call with any health related questions or concerns.   [place] [facility]  Management [phone]

## 2021-08-26 ENCOUNTER — Ambulatory Visit: Payer: Self-pay | Admitting: *Deleted

## 2021-08-26 NOTE — Patient Outreach (Signed)
[facility]) Care Management  08/26/2021  Priscilla Cantrell 12-28-1935 [id number]   RN Health Coach telephone call to patient.  [place] compliance verified. Per patient her breathing is not good. Increase shortness with breath with heat and exercise. Patient is taking Trelegy as per ordered. She is using her oxygen at night. Encouraged patient to stay hydrated. Patient has appointment made with gastroenterologist for Mon. Patient stated she is having some stomach problems of constant loud rumbling even though have consumed meal. RN Health Coach case closure. Referred to Care Coordinator for follow up care.  Plan: Referred to Care Coordinator for follow up care.  RN Health Coach Case closure  [place] [facility] Management [phone]

## 2021-08-27 ENCOUNTER — Telehealth: Payer: Self-pay | Admitting: Pulmonary Disease

## 2021-08-27 MED ORDER — PREDNISONE 20 MG PO TABS: 20.0000 mg | ORAL_TABLET | Freq: Every day | ORAL | 0 refills | Status: DC

## 2021-08-27 MED ORDER — AZITHROMYCIN 250 MG PO TABS: ORAL_TABLET | ORAL | 0 refills | Status: DC

## 2021-08-27 NOTE — Telephone Encounter (Signed)
Called and spoke with patient. Patient verbalized understanding. Nothing further needed.  

## 2021-08-27 NOTE — Telephone Encounter (Signed)
Primary Pulmonologist: [name]  Last office visit and with whom: [name] 07/09/2021 What do we see them for (pulmonary problems): centrilobular emphysema/ chronic respiratory failure with hypoxia  Last OV assessment/plan: see below    Was appointment offered to patient (explain)?  Patient has upcoming appt in August. Patient is unable to come to [facility] office for an appt.    Reason for call:  Patient states for the last week she has been more SOB than usual. Has not checked her O2 levels but has been using 2LO2 cont prn during the day since her SOB has started. Feels when she takes a deep breath she feels like she has something in her throat. She has a dry cough. Feels she has bronchitis.   Patient is asking for a z pak and prednisone. She states [name] normally prescribes this for her.   [name] is out of office, sending to DOD- Rexene Edison NP, please advise    Allergies  Allergen Reactions   Losartan Other (See Comments)    hyperkalemia   Penicillins Hives, Swelling and Rash    Has patient had a PCN reaction causing immediate rash, facial/tongue/throat swelling, SOB or lightheadedness with hypotension: YES Has patient had a PCN reaction causing severe rash involving mucus membranes or skin necrosis: NO Has patient had a PCN reaction that required hospitalization NO Has patient had a PCN reaction occurring within the last 10 years: NO If all of the above answers are "NO", then may proceed with Cephalosporin use.    Cefixime [Kdc:Cefixime] Other (See Comments)    unspecified   Gabapentin Other (See Comments)    Feels woozy   Indocin [Indomethacin] Other (See Comments)    Painful tongue and throat   Singulair [Montelukast Sodium]     Chest tightness, decreased mental clarity   Sulfa Antibiotics Other (See Comments)    unspecified   Ace Inhibitors Other (See Comments)    cough   Statins Other (See Comments)    Myalgias     Immunization History  Administered Date(s)  Administered   Fluad Quad(high Dose 65+) 10/05/2018, 10/25/2019, 12/07/2020   Influenza Split 11/04/2010, 10/23/2011   Influenza Whole 10/04/2009   Influenza, High Dose Seasonal PF 11/07/2016, 11/04/2017   Influenza,inj,Quad PF,6+ Mos 10/07/2012, 10/11/2014   Influenza-Unspecified 11/07/2013, 11/01/2015   Moderna Sars-Covid-2 Vaccination 02/14/2019, 03/15/2019, 12/15/2019   PNEUMOCOCCAL CONJUGATE-20 07/17/2020   Pfizer Covid-19 Vaccine Bivalent Booster 43yr & up 12/19/2020   Pneumococcal Conjugate-13 04/29/2013   Pneumococcal Polysaccharide-23 02/03/2005, 11/28/2010, 11/01/2018   Td 02/04/2008, 03/07/2010   Tdap 03/07/2020   Zoster Recombinat (Shingrix) 10/30/2016, 12/29/2016   Zoster, Live 02/10/2013       Assessment/Plan:    GOLD 4 COPD with chronic bronchitis. - continue trelegy and daliresp - will arrange for spacer device to use with her albuterol - if her symptoms continue to progress, then could try changing to maintenance nebulizer medication with pulmicort/yupelri/perforomist   Allergic rhinitis with upper airway cough. - prn zyrtec, azelastine, flonase   Chronic respiratory failure with hypoxia. - goal SpO2 > 90% - will arrange for ONO with 2 liters to determine if she needs to be on higher level of supplemental oxygen at night - advised her to use her oxygen with activity on more regular basis during the day   Time Spent Involved in Patient Care on Day of Examination:  37 minutes   Follow up:    Patient Instructions  Will arrange for overnight oxygen test with 2 liters  Try using the spacer device with your albuterol inhaler   Follow up in 2 months   Medication List:    Allergies as of 07/09/2021         Reactions    Losartan Other (See Comments)    hyperkalemia    Penicillins Hives, Swelling, Rash    Has patient had a PCN reaction causing immediate rash, facial/tongue/throat swelling, SOB or lightheadedness with hypotension: YES Has patient had a PCN  reaction causing severe rash involving mucus membranes or skin necrosis: NO Has patient had a PCN reaction that required hospitalization NO Has patient had a PCN reaction occurring within the last 10 years: NO If all of the above answers are "NO", then may proceed with Cephalosporin use.    Cefixime [kdc:cefixime] Other (See Comments)    unspecified    Gabapentin Other (See Comments)    Feels woozy    Indocin [indomethacin] Other (See Comments)    Painful tongue and throat    Singulair [montelukast Sodium]      Chest tightness, decreased mental clarity    Sulfa Antibiotics Other (See Comments)    unspecified    Ace Inhibitors Other (See Comments)    cough    Statins Other (See Comments)    Myalgias            Medication List           Accurate as of July 09, 2021  3:53 PM. If you have any questions, ask your nurse or doctor.              albuterol 108 (90 Base) MCG/ACT inhaler Commonly known as: ProAir HFA Inhale 2 puffs into the lungs every 4 (four) hours as needed for wheezing or shortness of breath.    alendronate 70 MG tablet Commonly known as: FOSAMAX TAKE 1 TABLET(70 MG) BY MOUTH 1 TIME A WEEK WITH A FULL GLASS OF WATER AND ON AN EMPTY STOMACH    aspirin EC 81 MG tablet Take 81 mg by mouth daily.    azelastine 0.1 % nasal spray Commonly known as: ASTELIN Place 2 sprays into both nostrils 2 (two) times daily. Use in each nostril as directed    cetirizine 10 MG tablet Commonly known as: ZYRTEC Take 1 tablet (10 mg total) by mouth daily.    clobetasol 0.05 % external solution Commonly known as: TEMOVATE APPLY TO THE AFFECTED AREA EVERY DAY    diazepam 5 MG tablet Commonly known as: VALIUM TAKE 1 TABLET BY MOUTH DAILY AS NEEDED FOR ANXIETY AND INSOMNIA    fluticasone 0.05 % cream Commonly known as: CUTIVATE Apply topically 2 (two) times daily.    fluticasone 50 MCG/ACT nasal spray Commonly known as: FLONASE Place 2 sprays into both nostrils daily.     furosemide 20 MG tablet Commonly known as: LASIX 2 tabs qd alt with 1 tab qd    Gemtesa 75 MG Tabs Generic drug: Vibegron Take 1 capsule by mouth daily.    levothyroxine 100 MCG tablet Commonly known as: SYNTHROID Take 1 tablet (100 mcg total) by mouth daily.    nitrofurantoin 50 MG capsule Commonly known as: MACRODANTIN TAKE 1 CAPSULE(50 MG) BY MOUTH AT BEDTIME    PROBIOTIC DAILY PO Take 1 capsule by mouth daily.    RESTASIS OP Apply to eye.    roflumilast 500 MCG Tabs tablet Commonly known as: DALIRESP Take 1 tablet (500 mcg total) by mouth daily.    Soothe XP Soln Place 1 drop into  both eyes 2 (two) times daily as needed (irritation).    spironolactone 25 MG tablet Commonly known as: ALDACTONE 1.5 tab po bid    Trelegy Ellipta 100-62.5-25 MCG/ACT Aepb Generic drug: Fluticasone-Umeclidin-Vilant Inhale 1 puff into the lungs daily.    triamcinolone cream 0.1 % Commonly known as: KENALOG Apply 1 application topically 2 (two) times daily.    venlafaxine 50 MG tablet Commonly known as: EFFEXOR Take 1 tablet (50 mg total) by mouth 2 (two) times daily.    Visine 0.05 % ophthalmic solution Generic drug: tetrahydrozoline 1 drop  as needed    vitamin B-12 100 MCG tablet Commonly known as: CYANOCOBALAMIN Take 100 mcg by mouth daily.    VITAMIN D PO Take by mouth.    Xiidra 5 % Soln Generic drug: Lifitegrast Apply to eye in the morning and at bedtime.             Signature:  Chesley Mires, MD [place] Pager - [phone] 07/09/2021, 3:53 PM

## 2021-08-27 NOTE — Telephone Encounter (Signed)
COPD flare-May begin Z-Pak No. 1 take as directed,  prednisone 20 mg daily for 5 days.   If symptoms are not improving or worsen will need to come in for office visit or seek emergency room care  Please contact office for sooner follow up if symptoms do not improve or worsen or seek emergency care

## 2021-08-28 DIAGNOSIS — H04123 Dry eye syndrome of bilateral lacrimal glands: Secondary | ICD-10-CM | POA: Diagnosis not present

## 2021-08-28 DIAGNOSIS — H02054 Trichiasis without entropian left upper eyelid: Secondary | ICD-10-CM | POA: Diagnosis not present

## 2021-08-29 ENCOUNTER — Ambulatory Visit: Payer: Medicare Other | Admitting: Primary Care

## 2021-09-02 ENCOUNTER — Ambulatory Visit (INDEPENDENT_AMBULATORY_CARE_PROVIDER_SITE_OTHER): Payer: Medicare Other | Admitting: Gastroenterology

## 2021-09-02 ENCOUNTER — Encounter: Payer: Self-pay | Admitting: Gastroenterology

## 2021-09-02 VITALS — BP 136/72 | HR 60 | Ht 60.0 in | Wt 120.4 lb

## 2021-09-02 DIAGNOSIS — R159 Full incontinence of feces: Secondary | ICD-10-CM | POA: Diagnosis not present

## 2021-09-02 DIAGNOSIS — R198 Other specified symptoms and signs involving the digestive system and abdomen: Secondary | ICD-10-CM | POA: Diagnosis not present

## 2021-09-02 DIAGNOSIS — R195 Other fecal abnormalities: Secondary | ICD-10-CM

## 2021-09-02 DIAGNOSIS — Z86010 Personal history of colonic polyps: Secondary | ICD-10-CM | POA: Diagnosis not present

## 2021-09-02 DIAGNOSIS — R151 Fecal smearing: Secondary | ICD-10-CM | POA: Diagnosis not present

## 2021-09-02 NOTE — Progress Notes (Unsigned)
Chief Complaint: Diarrhea, loud abdominal noises   Referring Provider:     [name], MD    HPI:     Priscilla Cantrell is a 86 y.o. female with medical history as below referred to the [facility] for evaluation of diarrhea along with lower abdominal noises.  Previously followed with Dr. Earlean Shawl, last seen on 03/06/2020 for evaluation of fecal smearing.  Has had loose stools for the last several months. Loose stools in AM, followed by fecal seepage mid morning. Has had occasional nocturnal seepage as well, and now wears Depends at night. Has trialed OTC anti-diarrheals with improvement. Has not trialed fiber supplement.   Loud gurgling noise in LLQ, but no abdominal pain, spasm, or cramping. Increased gas. Has trialed Gas-X with improvement.   -06/2021: Normal CBC, CMP, lipase, negative C. difficile.  TSH 6.73 (much improved from April) -07/04/2021: CT A/P: Stable tiny hepatic cysts, ccy, normal GI tract.  No lymphadenopathy  Hx of CDI in 2019. Avoids dairy and chocolate since then.   Endoscopic History: - Colonoscopy (07/2000): Sigmoid diverticulosis - Colonoscopy (04/2005, following episode of sigmoid diverticulitis with hematochezia): Sigmoid diverticulosis without active inflammation, otherwise normal. - Colonoscopy (12/2015): 9 mm pedunculated polyp in hepatic flexure, sigmoid diverticulosis.  No path available for review   Past Medical History:  Diagnosis Date   Allergic rhinitis    with upper airway cough: as of 07/2017 pulm f/u she was instructed to use astelin, flonase, and saline more consistently   BPPV (benign paroxysmal positional vertigo) 03/2018   C. difficile colitis    Chest pain, non-cardiac    Cardiac CT showed no signif obstructive dz, mildly elevated calcium level (Dr. [name]).   Chronic cystitis with hematuria    [name]   Chronic hypoxemic respiratory failure ([place]) 02/02/2015   2L oxygen 24/7   Chronic renal  insufficiency, stage 2 (mild)    GFR approx 60   Cold hands    NCS/EMGs normal.  Hand dysesthesias per neuro--reassured   COPD (chronic obstructive pulmonary disease) ([place])    GOLD 4.  spirometry 01/10/09 FEV 0.97(52%), FEV1% 47.  With chronic bronchitis as of 07/2017.   DDD (degenerative disc disease), cervical    1998 MRI C5-6 impingemt (left).  MRI 2021 (neurol) 1 level canal stenosis, 1 level foraminal stenosis--->PT   DDD (degenerative disc disease), lumbar    Depression with anxiety 04/15/2011   Diarrheal disease Summer 2017   ? 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).  As of 10/05/15 pt reports GI dx'd her with C diff and rx'd more flagyl and she is also on cholestyramine.  As of 02/2016, pt's sx's resolved completely.   Diverticulosis of colon    Procto '97 and colonoscopy 2002   Dysplastic nevus of upper extremity 04/2014   R tricep ([name])   Edema of both lower extremities due to peripheral venous insufficiency    Varicosities bilat, swelling L>R.  R baker's cyst.  Got vein clinic eval 02/2018->sclerotherapy recommended but since no hemorrhage or ulceration, insurance will not cover the procedure.   Family history of adverse reaction to anesthesia    mother died when patient age 40 receiving anesthesia for thyroid surgery   Fibrocystic breast disease    w/fibroadenoma.  Bx's showed NO atypia (Dr. Margot Chimes).  Mammo neg 2008.   GERD (gastroesophageal reflux disease)  History of double vision    Ophthalmologist, Dr. Herbert Deaner, is further evaluating this with MRI  orbits and limited brain (myesthenia gravis testing neg 07/2010)   History of home oxygen therapy    uses 2 liters at hs   Hypertension    EKG 03/2010 normal   Hypothyroidism    Hashimoto's, dx'd 1989   Idiopathic peripheral neuropathy 04/2018   Sensory (numb feet) S1 distribution bilat, c/w spinal stenosis sensory  neuropathy (no pain). 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  Labs [place], MRI C spine->some spondylosis/stenosis.  UE NCS/EMS: normal 04/2019.   Lesion of right native kidney 2017   found on CT 02/2015.  F/u MRI 03/31/15 showed it to be smaller and less likely of any significance but repeat CT renal protocol in [place] recommended by radiology.  This was done 10/26/15 and showed no interval change in the lesion, but b/c the lesion enhances with contrast, radiol rec'd urol consult (b/c pap renal RCC could not be excluded).  Saw urol 03/06/16--stable on u/s 09/2016 and 09/2017.   OAB (overactive bladder)    Osteoarthritis, hip, bilateral    she is s/p bilat THA as of 02/2018.   Osteoporosis    radius, T score -2.5->rec'd alendronate 08/2019->plan rpt DEXA 08/2021.   Pneumonia    Postmenopausal atrophic vaginitis    improved with estrace 2 X/week.   Prolapse of vaginal cuff after hysterectomy    10/2018: Dr. FWayne Both Mild colpocele 04/2019 per GYN (no surgery)   Pulmonary nodule    CT chest 12/10, June 2011.  No nodule on CT 06/2010.   Recurrent Clostridium difficile diarrhea    Episode 09/19/2016: resolved with prolonged course of flagyl.  11/2016 recurrence treated with 10d of Dificid (Fidaxomicin) by GI (Dr. MEarlean Shawl.   Recurrent UTI    likely related to pt's atrophic vaginitis.  Urol started vaginal estrace 03/2016.   Rib fracture 09/2018   R 7th, laterally-->sustained when her dog pulled her over and she fell.   RLS (restless legs syndrome)    neuro->gabapentin trial 2020/21   Shingles 02/2014   R abd/flank   Spinal stenosis of lumbar region with neurogenic claudication    2008 MRI (L3-4, L4-5, L5-S1).  +S1 distribution sensory loss bilat     Past Surgical History:  Procedure Laterality Date   ABDOMINAL HYSTERECTOMY  1978   no BSO per pt--nonmalignant reasons   APPENDECTOMY     BREAST CYST EXCISION     Last screening mammogram  10/2010 was normal.   CHOLECYSTECTOMY  2001   COLONOSCOPY  04/2005; 12/06/2015   2007 NORMAL.  2017 adenomatous polyp x 1.  Mod sigmoid diverticulosis (Dr. MEarlean Shawl.     DEXA  05/18/2017; 08/7019   2019 NORMAL (T-score 0.1).  08/2019->T score -2.5->alendronate started->plan Rpt 08/2021.   HIP CLOSED REDUCTION Right 12/25/2015   Procedure: CLOSED REDUCTION HIP;  Surgeon: JNicholes Stairs MD;  Location: [facility];  Service: Orthopedics;  Laterality: Right;   NCV/EMS  10/2018   chronic and symmetric sensorimotor axonal polyneuropathy affecting the lower extremities   TONSILLECTOMY     TOTAL HIP ARTHROPLASTY Right 11/01/2014   Procedure: RIGHT TOTAL HIP ARTHROPLASTY;  Surgeon: RLatanya Maudlin MD;  Location: [facility];  Service: Orthopedics;  Laterality: Right;   TOTAL HIP ARTHROPLASTY Left 12/08/2017   Procedure: LEFT TOTAL HIP ARTHROPLASTY ANTERIOR APPROACH;  Surgeon: OParalee Cancel MD;  Location: [facility];  Service: Orthopedics;  Laterality: Left;  70 mins   TRANSTHORACIC  ECHOCARDIOGRAM  08/2014   2016 EF 60-65%, grade I diast dysfxn. 06/21/21 unchanged.   TUBAL LIGATION  1974   Family History  Problem Relation Age of Onset   Goiter Mother    Psoriasis Father        od liver   Cirrhosis Father    Diabetes Daughter        Type 2   Stroke Maternal Grandfather    Stroke Paternal Grandmother    Hypertension Paternal Grandmother    Hernia Paternal Grandmother    Cancer Paternal Grandfather        lung   Cancer Maternal Aunt        cancer of the stomach   Social History   Tobacco Use   Smoking status: Former    Packs/day: 1.50    Years: 34.00    Total pack years: 51.00    Types: Cigarettes    Start date: 62    Quit date: 02/03/1990    Years since quitting: 31.6   Smokeless tobacco: Never  Vaping Use   Vaping Use: Never used  Substance Use Topics   Alcohol use: Yes    Comment: Rarely   Drug use: No   Current Outpatient Medications  Medication Sig Dispense Refill   albuterol  (PROAIR HFA) 108 (90 Base) MCG/ACT inhaler Inhale 2 puffs into the lungs every 4 (four) hours as needed for wheezing or shortness of breath. 8.5 g 1   alendronate (FOSAMAX) 70 MG tablet TAKE 1 TABLET(70 MG) BY MOUTH 1 TIME A WEEK WITH A FULL GLASS OF WATER AND ON AN EMPTY STOMACH 12 tablet 3   Artificial Tear Solution (SOOTHE XP) SOLN Place 1 drop into both eyes 2 (two) times daily as needed (irritation).     aspirin EC 81 MG tablet Take 81 mg by mouth daily.     azelastine (ASTELIN) 0.1 % nasal spray Place 2 sprays into both nostrils 2 (two) times daily. Use in each nostril as directed 30 mL 3   cetirizine (ZYRTEC) 10 MG tablet Take 1 tablet (10 mg total) by mouth daily. 30 tablet 6   clobetasol (TEMOVATE) 0.05 % external solution APPLY TO THE AFFECTED AREA EVERY DAY 50 mL 4   cycloSPORINE (RESTASIS OP) Apply to eye.     diazepam (VALIUM) 5 MG tablet TAKE 1 TABLET BY MOUTH DAILY AS NEEDED FOR ANXIETY AND INSOMNIA 30 tablet 5   fluticasone (CUTIVATE) 0.05 % cream Apply topically 2 (two) times daily. 30 g 1   fluticasone (FLONASE) 50 MCG/ACT nasal spray Place 2 sprays into both nostrils daily. 16 g 6   Fluticasone-Umeclidin-Vilant (TRELEGY ELLIPTA) 100-62.5-25 MCG/ACT AEPB Inhale 1 puff into the lungs daily. 180 each 3   furosemide (LASIX) 20 MG tablet 2 tabs qd alt with 1 tab qd 90 tablet 1   levothyroxine (SYNTHROID) 100 MCG tablet Take 1 tablet (100 mcg total) by mouth daily. 30 tablet 2   Lifitegrast (XIIDRA) 5 % SOLN Apply to eye in the morning and at bedtime.     nitrofurantoin (MACRODANTIN) 50 MG capsule TAKE 1 CAPSULE(50 MG) BY MOUTH AT BEDTIME 90 capsule 3   Probiotic Product (PROBIOTIC DAILY PO) Take 1 capsule by mouth daily.      roflumilast (DALIRESP) 500 MCG TABS tablet Take 1 tablet (500 mcg total) by mouth daily. 90 tablet 3   spironolactone (ALDACTONE) 25 MG tablet 1.5 tab po bid 90 tablet 1   triamcinolone cream (KENALOG) 0.1 % Apply 1 application topically 2 (  two) times daily. 45  g 2   venlafaxine (EFFEXOR) 50 MG tablet Take 1 tablet (50 mg total) by mouth 2 (two) times daily. 180 tablet 3   Vibegron (GEMTESA) 75 MG TABS Take 1 capsule by mouth daily. 30 tablet 11   VISINE 0.05 % ophthalmic solution 1 drop  as needed     vitamin B-12 (CYANOCOBALAMIN) 100 MCG tablet Take 100 mcg by mouth daily.     VITAMIN D PO Take by mouth.     No current facility-administered medications for this visit.   Allergies  Allergen Reactions   Losartan Other (See Comments)    hyperkalemia   Penicillins Hives, Swelling and Rash    Has patient had a PCN reaction causing immediate rash, facial/tongue/throat swelling, SOB or lightheadedness with hypotension: YES Has patient had a PCN reaction causing severe rash involving mucus membranes or skin necrosis: NO Has patient had a PCN reaction that required hospitalization NO Has patient had a PCN reaction occurring within the last 10 years: NO If all of the above answers are "NO", then may proceed with Cephalosporin use.    Cefixime [Kdc:Cefixime] Other (See Comments)    unspecified   Gabapentin Other (See Comments)    Feels woozy   Indocin [Indomethacin] Other (See Comments)    Painful tongue and throat   Singulair [Montelukast Sodium]     Chest tightness, decreased mental clarity   Sulfa Antibiotics Other (See Comments)    unspecified   Ace Inhibitors Other (See Comments)    cough   Statins Other (See Comments)    Myalgias      Review of Systems: All systems reviewed and negative except where noted in HPI.     Physical Exam:    Wt Readings from Last 3 Encounters:  09/02/21 120 lb 6 oz (54.6 kg)  07/25/21 124 lb (56.2 kg)  07/09/21 125 lb 9.6 oz (57 kg)    BP 136/72   Pulse 60   Ht 5' (1.524 m)   Wt 120 lb 6 oz (54.6 kg)   BMI 23.51 kg/m  Constitutional:  Pleasant, in no acute distress. Psychiatric: Normal mood and affect. Behavior is normal. EENT: Pupils normal.  Conjunctivae are normal. No scleral  icterus. Neck supple. No cervical LAD. Cardiovascular: Normal rate, regular rhythm. No edema Pulmonary/chest: Effort normal and breath sounds normal. No wheezing, rales or rhonchi. Abdominal: Soft, nondistended, nontender. Bowel sounds active throughout. There are no masses palpable. No hepatomegaly. Neurological: Alert and oriented to person place and time. Skin: Skin is warm and dry. No rashes noted.   ASSESSMENT AND PLAN;   1)  ARM Fiber supplement Gas-X, Beano, etc prn   RTC in 3-6 months    Lavena Bullion, DO, FACG  09/02/2021, 11:04 AM   [place]owen, Adrian Blackwater, MD

## 2021-09-02 NOTE — Patient Instructions (Addendum)
If you are age 84 or older, your body mass index should be between 23-30. Your Body mass index is 23.51 kg/m. If this is out of the aforementioned range listed, please consider follow up with your Primary Care Provider.  ________________________________________________________  The [facility] providers would like to encourage you to use [facility] to communicate with providers for non-urgent requests or questions.  Due to long hold times on the telephone, sending your provider a message by [facility] may be a faster and more efficient way to get a response.  Please allow 48 business hours for a response.  Please remember that this is for non-urgent requests.  _______________________________________________________   Due to recent changes in healthcare laws, you may see the results of your imaging and laboratory studies on MyChart before your provider has had a chance to review them.  We understand that in some cases there may be results that are confusing or concerning to you. Not all laboratory results come back in the same time frame and the provider may be waiting for multiple results in order to interpret others.  Please give [place] 48 hours in order for your provider to thoroughly review all the results before contacting the office for clarification of your results.     You have been scheduled to have an anorectal manometry at [facility] on 01/01/22 at 830 am. Please arrive 30 minutes prior to your appointment time for registration (1st floor of the hospital-admissions).  Please make certain to use 1 Fleets enema 2 hours prior to coming for your appointment. You can purchase Fleets enemas from the laxative section at your drug store. You should not eat anything during the two hours prior to the procedure. You may take regular medications with small sips of water at least 2 hours prior to the study.  Anorectal manometry is a test performed to evaluate patients with constipation or  fecal incontinence. This test measures the pressures of the anal sphincter muscles, the sensation in the rectum, and the neural reflexes that are needed for normal bowel movements.  THE PROCEDURE The test takes approximately 30 minutes to 1 hour. You will be asked to change into a hospital gown. A technician or nurse will explain the procedure to you, take a brief health history, and answer any questions you may have. The patient then lies on his or her left side. A small, flexible tube, about the size of a thermometer, with a balloon at the end is inserted into the rectum. The catheter is connected to a machine that measures the pressure. During the test, the small balloon attached to the catheter may be inflated in the rectum to assess the normal reflex pathways. The nurse or technician may also ask the person to squeeze, relax, and push at various times. The anal sphincter muscle pressures are measured during each of these maneuvers. To squeeze, the patient tightens the sphincter muscles as if trying to prevent anything from coming out. To push or bear down, the patient strains down as if trying to have a bowel movement.   Start fiber supplement, use either Metamucil, or Benefiber.    Please follow up in 3 to 6  months. Give [place] a call at [phone] to schedule an appointment.   Thank you for choosing me and [facility].  Vito Cirigliano, D.O.

## 2021-09-09 ENCOUNTER — Ambulatory Visit (INDEPENDENT_AMBULATORY_CARE_PROVIDER_SITE_OTHER): Payer: Medicare Other | Admitting: Pulmonary Disease

## 2021-09-09 ENCOUNTER — Other Ambulatory Visit: Payer: Self-pay

## 2021-09-09 ENCOUNTER — Encounter: Payer: Self-pay | Admitting: Pulmonary Disease

## 2021-09-09 VITALS — BP 124/62 | HR 68 | Temp 97.7°F | Ht 60.0 in | Wt 119.4 lb

## 2021-09-09 DIAGNOSIS — J432 Centrilobular emphysema: Secondary | ICD-10-CM | POA: Diagnosis not present

## 2021-09-09 DIAGNOSIS — J9611 Chronic respiratory failure with hypoxia: Secondary | ICD-10-CM | POA: Diagnosis not present

## 2021-09-09 DIAGNOSIS — R058 Other specified cough: Secondary | ICD-10-CM | POA: Diagnosis not present

## 2021-09-09 MED ORDER — ALBUTEROL SULFATE (2.5 MG/3ML) 0.083% IN NEBU: 2.5000 mg | INHALATION_SOLUTION | Freq: Four times a day (QID) | RESPIRATORY_TRACT | 5 refills | Status: DC | PRN

## 2021-09-09 MED ORDER — SPIRONOLACTONE 25 MG PO TABS: ORAL_TABLET | ORAL | 1 refills | Status: DC

## 2021-09-09 NOTE — Patient Instructions (Signed)
Use albuterol by nebulizer as needed for cough, wheeze, chest congestion or shortness of breath.  Sip water when you have the urge to cough.  Uses sugarless candy to help keep your mouth moist.  Salt water gargle once or twice per day as needed.  Try using 1 teaspoon of local honey as needed to help with cough.  Follow up in 3 months.

## 2021-09-09 NOTE — Progress Notes (Signed)
[facility]  [facility]  Chief Complaint  Patient presents with   Follow-up    Feeling better overall since sick call on 7/25. Still feeling SOB     Constitutional:  BP 124/62 (BP Location: Left Arm, Patient Position: Sitting)   Pulse 68   Temp 97.7 F (36.5 C) (Temporal)   Ht 5' (1.524 m)   Wt 119 lb 6.4 oz (54.2 kg)   SpO2 94% Comment: ra  BMI 23.32 kg/m   Past Medical History:  Chronic cystitis, DDD, Anxiety, Depression, C diff 2017 and 2018, Diverticulosis, GERD, HTN, Hypothyroidism, PNA, Shingles, Spinal stenosis  Past Surgical History:  She  has a past surgical history that includes Cholecystectomy (2001); Tubal ligation (1974); Abdominal hysterectomy (1978); Breast cyst excision; Appendectomy; Tonsillectomy; Colonoscopy (04/2005; 12/06/2015); transthoracic echocardiogram (08/2014); Total hip arthroplasty (Right, 11/01/2014); Hip Closed Reduction (Right, 12/25/2015); DEXA (05/18/2017; 08/7019); Total hip arthroplasty (Left, 12/08/2017); and NCV/EMS (10/2018).  Brief Summary:  Priscilla Cantrell is a 86 y.o. female former smoker with GOLD 4 COPD, ACE cough, and nocturnal hypoxemia.       Subjective:   She is here with her boyfriend.  She called in to the office in July for a COPD flare up.  Receive zithromax, and this helped.  She is to follow up with GI for assessment of reflux.  She has globus sensation.  Starting to get back to usual activity.  Heat has been bad for her breathing.  Physical Exam:   Appearance - well kempt   ENMT - no sinus tenderness, no oral exudate, no LAN, Mallampati 3 airway, no stridor, raspy voice  Respiratory - decreased breath sounds bilaterally, no wheezing or rales  CV - s1s2 regular rate and rhythm, no murmurs  Ext - no clubbing, no edema  Skin - no rashes  Psych - normal mood and affect      Pulmonary testing:  Spirometry 01/10/09>>FEV1 0.97(52%), FEV1% 47 March 2012>>Daliresp  started RAST 05/04/17 >> negative, IgE 8 Spirometry 05/14/17 >> FEV1 0.91 (55%), FEV1% 48 PFT 10/29/18 >> FEV1 1.05 (68%), FEV1% 52, TLC 4.29 (96%), DLCO 44%  Sleep Tests:  PSG 11/14/11 >> AHI 0.3  ONO with RA 07/31/16 >> test time 6 hrs 41 min.  Average SpO2 87%, low SpO2 74%.  Spent 4 hrs 13 min with SpO2 < 88%. ONO with 2 liters 07/24/21 >> test time 3 hrs 58 min.  Baseline SpO2 97%, low SpO2 84%.  Spent 19 sec with SpO2 < 88%.  Cardiac Tests:  Echo 06/21/21 >> EF 60 to 65%, grade 1 DD, mild elevation in PASP, mild LA dilation  Social History:  She  reports that she quit smoking about 31 years ago. Her smoking use included cigarettes. She started smoking about 65 years ago. She has a 51.00 pack-year smoking history. She has never used smokeless tobacco. She reports current alcohol use. She reports that she does not use drugs.  Family History:  Her family history includes Cancer in her maternal aunt and paternal grandfather; Cirrhosis in her father; Diabetes in her daughter; Goiter in her mother; Hernia in her paternal grandmother; Hypertension in her paternal grandmother; Psoriasis in her father; Stroke in her maternal grandfather and paternal grandmother.     Assessment/Plan:   GOLD 4 COPD with chronic bronchitis. - continue trelegy and daliresp - will add prn albuterol nebulizer - if symptoms progress, then could change to maintenance nebulizer medicines with pulmicort/yupelri/perforomist  Allergic rhinitis with upper airway cough. -  prn zyrtec, azelastine, flonase  Laryngopharyngeal reflux. - she is to follow up with GI   Chronic respiratory failure with hypoxia. - goal SpO2 > 90% - continue 2 liters oxygen at night  Time Spent Involved in Patient Care on Day of Examination:  36 minutes  Follow up:   Patient Instructions  Use albuterol by nebulizer as needed for cough, wheeze, chest congestion or shortness of breath.  Sip water when you have the urge to cough.  Uses  sugarless candy to help keep your mouth moist.  Salt water gargle once or twice per day as needed.  Try using 1 teaspoon of local honey as needed to help with cough.  Follow up in 3 months.  Medication List:   Allergies as of 09/09/2021       Reactions   Losartan Other (See Comments)   hyperkalemia   Penicillins Hives, Swelling, Rash   Has patient had a PCN reaction causing immediate rash, facial/tongue/throat swelling, SOB or lightheadedness with hypotension: YES Has patient had a PCN reaction causing severe rash involving mucus membranes or skin necrosis: NO Has patient had a PCN reaction that required hospitalization NO Has patient had a PCN reaction occurring within the last 10 years: NO If all of the above answers are "NO", then may proceed with Cephalosporin use.   Cefixime [kdc:cefixime] Other (See Comments)   unspecified   Gabapentin Other (See Comments)   Feels woozy   Indocin [indomethacin] Other (See Comments)   Painful tongue and throat   Singulair [montelukast Sodium]    Chest tightness, decreased mental clarity   Sulfa Antibiotics Other (See Comments)   unspecified   Ace Inhibitors Other (See Comments)   cough   Statins Other (See Comments)   Myalgias        Medication List        Accurate as of September 09, 2021 11:08 AM. If you have any questions, ask your nurse or doctor.          albuterol 108 (90 Base) MCG/ACT inhaler Commonly known as: ProAir HFA Inhale 2 puffs into the lungs every 4 (four) hours as needed for wheezing or shortness of breath. What changed: Another medication with the same name was added. Make sure you understand how and when to take each. Changed by: Chesley Mires, MD   albuterol (2.5 MG/3ML) 0.083% nebulizer solution Commonly known as: PROVENTIL Take 3 mLs (2.5 mg total) by nebulization every 6 (six) hours as needed for wheezing or shortness of breath. What changed: You were already taking a medication with the same name, and  this prescription was added. Make sure you understand how and when to take each. Changed by: Chesley Mires, MD   alendronate 70 MG tablet Commonly known as: FOSAMAX TAKE 1 TABLET(70 MG) BY MOUTH 1 TIME A WEEK WITH A FULL GLASS OF WATER AND ON AN EMPTY STOMACH   aspirin EC 81 MG tablet Take 81 mg by mouth daily.   azelastine 0.1 % nasal spray Commonly known as: ASTELIN Place 2 sprays into both nostrils 2 (two) times daily. Use in each nostril as directed   cetirizine 10 MG tablet Commonly known as: ZYRTEC Take 1 tablet (10 mg total) by mouth daily.   clobetasol 0.05 % external solution Commonly known as: TEMOVATE APPLY TO THE AFFECTED AREA EVERY DAY   diazepam 5 MG tablet Commonly known as: VALIUM TAKE 1 TABLET BY MOUTH DAILY AS NEEDED FOR ANXIETY AND INSOMNIA   fluticasone 0.05 % cream Commonly  known as: CUTIVATE Apply topically 2 (two) times daily.   fluticasone 50 MCG/ACT nasal spray Commonly known as: FLONASE Place 2 sprays into both nostrils daily.   furosemide 20 MG tablet Commonly known as: LASIX 2 tabs qd alt with 1 tab qd   Gemtesa 75 MG Tabs Generic drug: Vibegron Take 1 capsule by mouth daily.   levothyroxine 100 MCG tablet Commonly known as: SYNTHROID Take 1 tablet (100 mcg total) by mouth daily.   nitrofurantoin 50 MG capsule Commonly known as: MACRODANTIN TAKE 1 CAPSULE(50 MG) BY MOUTH AT BEDTIME   PROBIOTIC DAILY PO Take 1 capsule by mouth daily.   RESTASIS OP Apply to eye.   roflumilast 500 MCG Tabs tablet Commonly known as: DALIRESP Take 1 tablet (500 mcg total) by mouth daily.   Soothe XP Soln Place 1 drop into both eyes 2 (two) times daily as needed (irritation).   spironolactone 25 MG tablet Commonly known as: ALDACTONE 1.5 tab po bid   Trelegy Ellipta 100-62.5-25 MCG/ACT Aepb Generic drug: Fluticasone-Umeclidin-Vilant Inhale 1 puff into the lungs daily.   triamcinolone cream 0.1 % Commonly known as: KENALOG Apply 1 application  topically 2 (two) times daily.   venlafaxine 50 MG tablet Commonly known as: EFFEXOR Take 1 tablet (50 mg total) by mouth 2 (two) times daily.   Visine 0.05 % ophthalmic solution Generic drug: tetrahydrozoline 1 drop  as needed   vitamin B-12 100 MCG tablet Commonly known as: CYANOCOBALAMIN Take 100 mcg by mouth daily.   VITAMIN D PO Take by mouth.   Xiidra 5 % Soln Generic drug: Lifitegrast Apply to eye in the morning and at bedtime.        Signature:  Chesley Mires, MD [place] Pager - [phone] 09/09/2021, 11:08 AM

## 2021-09-10 ENCOUNTER — Other Ambulatory Visit: Payer: Self-pay

## 2021-09-10 MED ORDER — SPIRONOLACTONE 25 MG PO TABS: ORAL_TABLET | ORAL | 0 refills | Status: DC

## 2021-09-28 ENCOUNTER — Encounter (HOSPITAL_BASED_OUTPATIENT_CLINIC_OR_DEPARTMENT_OTHER): Payer: Self-pay | Admitting: Emergency Medicine

## 2021-09-28 ENCOUNTER — Emergency Department (HOSPITAL_BASED_OUTPATIENT_CLINIC_OR_DEPARTMENT_OTHER)
Admission: EM | Admit: 2021-09-28 | Discharge: 2021-09-28 | Disposition: A | Discharge status: Home / Self Care | Payer: Medicare Other | Attending: Emergency Medicine | Admitting: Emergency Medicine

## 2021-09-28 ENCOUNTER — Other Ambulatory Visit: Payer: Self-pay

## 2021-09-28 DIAGNOSIS — Z7982 Long term (current) use of aspirin: Secondary | ICD-10-CM | POA: Insufficient documentation

## 2021-09-28 DIAGNOSIS — H1132 Conjunctival hemorrhage, left eye: Secondary | ICD-10-CM

## 2021-09-28 DIAGNOSIS — E039 Hypothyroidism, unspecified: Secondary | ICD-10-CM | POA: Diagnosis not present

## 2021-09-28 DIAGNOSIS — I129 Hypertensive chronic kidney disease with stage 1 through stage 4 chronic kidney disease, or unspecified chronic kidney disease: Secondary | ICD-10-CM | POA: Insufficient documentation

## 2021-09-28 DIAGNOSIS — H5789 Other specified disorders of eye and adnexa: Secondary | ICD-10-CM | POA: Diagnosis present

## 2021-09-28 DIAGNOSIS — N189 Chronic kidney disease, unspecified: Secondary | ICD-10-CM | POA: Diagnosis not present

## 2021-09-28 DIAGNOSIS — J449 Chronic obstructive pulmonary disease, unspecified: Secondary | ICD-10-CM | POA: Insufficient documentation

## 2021-09-28 MED ORDER — CIPROFLOXACIN HCL 0.3 % OP SOLN: 1.0000 [drp] | OPHTHALMIC | 0 refills | Status: DC

## 2021-09-28 NOTE — ED Triage Notes (Signed)
Pt noticed sclera red (LT eye) yesterday; denies pain, injury, vision change

## 2021-09-28 NOTE — ED Notes (Signed)
Patient denies any visual change . Left eye scelra is red. Alert x 4. Start notice eye red yesterday around 1900

## 2021-09-28 NOTE — ED Provider Notes (Addendum)
[facility] Provider Note   CSN: [id number] Arrival date & time: 09/28/21  1159     History  Chief Complaint  Patient presents with   Eye Problem    Priscilla Cantrell is a 86 y.o. female.  Pt is a 86 yo female with a pmhx significant for HTN, COPD, GERD, hypothyroidism, DDD, depression, PA, RLS, CKD, and dry eyes.  She is followed by Dr. Rogene Houston (ophthalmology).  She noticed that her left eye was red yesterday.  Today, the redness was worse.  She denies any change in her vision.  She feels like she has a little drainage to her left eye, but it was not matted shut.  She is not on blood thinners.       Home Medications Prior to Admission medications   Medication Sig Start Date End Date Taking? Authorizing Provider  ciprofloxacin (CILOXAN) 0.3 % ophthalmic solution Place 1 drop into the left eye every 4 (four) hours while awake. Administer 1 drop, every 2 hours, while awake, for 2 days. Then 1 drop, every 4 hours, while awake, for the next 5 days. 09/28/21  Yes Isla Pence, MD  albuterol (PROAIR HFA) 108 (90 Base) MCG/ACT inhaler Inhale 2 puffs into the lungs every 4 (four) hours as needed for wheezing or shortness of breath. 08/08/21   Chesley Mires, MD  albuterol (PROVENTIL) (2.5 MG/3ML) 0.083% nebulizer solution Take 3 mLs (2.5 mg total) by nebulization every 6 (six) hours as needed for wheezing or shortness of breath. 09/09/21   Chesley Mires, MD  alendronate (FOSAMAX) 70 MG tablet TAKE 1 TABLET(70 MG) BY MOUTH 1 TIME A WEEK WITH A FULL GLASS OF WATER AND ON AN EMPTY STOMACH 09/10/20   McGowen, Adrian Blackwater, MD  Artificial Tear Solution (SOOTHE XP) SOLN Place 1 drop into both eyes 2 (two) times daily as needed (irritation).    [provider]  aspirin EC 81 MG tablet Take 81 mg by mouth daily.    [provider]  azelastine (ASTELIN) 0.1 % nasal spray Place 2 sprays into both nostrils 2 (two) times daily. Use in each nostril as directed 05/22/20    McGowen, Adrian Blackwater, MD  cetirizine (ZYRTEC) 10 MG tablet Take 1 tablet (10 mg total) by mouth daily. 05/22/20   McGowen, Adrian Blackwater, MD  clobetasol (TEMOVATE) 0.05 % external solution APPLY TO THE AFFECTED AREA EVERY DAY 03/08/20   Sheffield, Vida Roller R, PA-C  cycloSPORINE (RESTASIS OP) Apply to eye.    [provider]  diazepam (VALIUM) 5 MG tablet TAKE 1 TABLET BY MOUTH DAILY AS NEEDED FOR ANXIETY AND INSOMNIA 07/03/21   McGowen, Adrian Blackwater, MD  fluticasone (CUTIVATE) 0.05 % cream Apply topically 2 (two) times daily. 05/15/21   McGowen, Adrian Blackwater, MD  fluticasone (FLONASE) 50 MCG/ACT nasal spray Place 2 sprays into both nostrils daily. 05/22/20   McGowen, Adrian Blackwater, MD  Fluticasone-Umeclidin-Vilant (TRELEGY ELLIPTA) 100-62.5-25 MCG/ACT AEPB Inhale 1 puff into the lungs daily. 04/10/21   Chesley Mires, MD  furosemide (LASIX) 20 MG tablet 2 tabs qd alt with 1 tab qd 08/02/21   McGowen, Adrian Blackwater, MD  levothyroxine (SYNTHROID) 100 MCG tablet Take 1 tablet (100 mcg total) by mouth daily. 07/03/21   McGowen, Adrian Blackwater, MD  Lifitegrast Shirley Friar) 5 % SOLN Apply to eye in the morning and at bedtime.    [provider]  nitrofurantoin (MACRODANTIN) 50 MG capsule TAKE 1 CAPSULE(50 MG) BY MOUTH AT BEDTIME 07/03/21   McGowen,  Adrian Blackwater, MD  Probiotic Product (PROBIOTIC DAILY PO) Take 1 capsule by mouth daily.     [provider]  roflumilast (DALIRESP) 500 MCG TABS tablet Take 1 tablet (500 mcg total) by mouth daily. 05/31/20   Martyn Ehrich, NP  spironolactone (ALDACTONE) 25 MG tablet 1.5 tab po bid 09/10/21   McGowen, Adrian Blackwater, MD  triamcinolone cream (KENALOG) 0.1 % Apply 1 application topically 2 (two) times daily. 08/17/20   Kuneff, Renee A, DO  venlafaxine (EFFEXOR) 50 MG tablet Take 1 tablet (50 mg total) by mouth 2 (two) times daily. 03/03/21   McGowen, Adrian Blackwater, MD  Vibegron (GEMTESA) 75 MG TABS Take 1 capsule by mouth daily. 11/23/20   McKenzie, [name], MD  VISINE 0.05 % ophthalmic solution 1  drop  as needed 12/14/19   [provider]  vitamin B-12 (CYANOCOBALAMIN) 100 MCG tablet Take 100 mcg by mouth daily.    [provider]  VITAMIN D PO Take by mouth.    [provider]      Allergies    Losartan, Penicillins, Cefixime [kdc:cefixime], Gabapentin, Indocin [indomethacin], Singulair [montelukast sodium], Sulfa antibiotics, Ace inhibitors, and Statins    Review of Systems   Review of Systems  Eyes:        Redness to left eye  All other systems reviewed and are negative.   Physical Exam Updated Vital Signs BP (!) 133/93   Pulse 68   Temp 97.8 F (36.6 C) (Oral)   Resp 18   Ht 5' (1.524 m)   Wt 54.4 kg   SpO2 92%   BMI 23.44 kg/m  Physical Exam Vitals and nursing note reviewed.  Constitutional:      Appearance: Normal appearance.  HENT:     Head: Normocephalic and atraumatic.     Right Ear: External ear normal.     Left Ear: External ear normal.     Nose: Nose normal.     Mouth/Throat:     Mouth: Mucous membranes are moist.     Pharynx: Oropharynx is clear.  Eyes:     General: Lids are normal. Vision grossly intact.     Conjunctiva/sclera:     Left eye: Hemorrhage present.  Cardiovascular:     Rate and Rhythm: Normal rate and regular rhythm.     Pulses: Normal pulses.     Heart sounds: Normal heart sounds.  Pulmonary:     Effort: Pulmonary effort is normal.     Breath sounds: Normal breath sounds.  Abdominal:     General: Abdomen is flat. Bowel sounds are normal.     Palpations: Abdomen is soft.  Musculoskeletal:        General: Normal range of motion.     Cervical back: Normal range of motion and neck supple.  Skin:    General: Skin is warm.     Capillary Refill: Capillary refill takes less than 2 seconds.  Neurological:     General: No focal deficit present.     Mental Status: She is alert and oriented to person, place, and time.  Psychiatric:        Mood and Affect: Mood normal.        Behavior: Behavior normal.      ED Results / Procedures / Treatments   Labs (all labs ordered are listed, but only abnormal results are displayed) Labs Reviewed - No data to display  EKG None  Radiology No results found.  Procedures Procedures    Medications  Ordered in ED Medications - No data to display  ED Course/ Medical Decision Making/ A&P                           Medical Decision Making Risk Prescription drug management.   This patient presents to the ED for concern of eye redness, this involves an extensive number of treatment options, and is a complaint that carries with it a high risk of complications and morbidity.  The differential diagnosis includes conjunctivitis, subconjunctival hemorrhage other eye abn   Co morbidities that complicate the patient evaluation  HTN, COPD, GERD, hypothyroidism, DDD, depression, PA, RLS, CKD, and dry eyes   Additional history obtained:  Additional history obtained from epic chart review Critical Interventions:    Visual Acuity  Right Eye Distance: 20/40 Left Eye Distance: 20/30 Bilateral Distance: 20/25  Right Eye Near:   Left Eye Near:    Bilateral Near:       Problem List / ED Course:  Left eye with subconjunctival hemorrhage.  Vision is better in the left than on the right.  She is given a rx for ciloxan soln in case the drainage worsens.  She is to f/u with Dr. Lucianne Lei.  Return if worse.   Reevaluation:  After the interventions noted above, I reevaluated the patient and found that they have :improved   Social Determinants of Health:  Lives at home   Dispostion:  After consideration of the diagnostic results and the patients response to treatment, I feel that the patent would benefit from discharge with outpatient f/u.          Final Clinical Impression(s) / ED Diagnoses Final diagnoses:  Subconjunctival hemorrhage, non-traumatic, left    Rx / DC Orders ED Discharge Orders          Ordered    ciprofloxacin  (CILOXAN) 0.3 % ophthalmic solution  Every 4 hours while awake        09/28/21 1248              Isla Pence, MD 09/28/21 1255    Isla Pence, MD 09/28/21 1256

## 2021-09-28 NOTE — Discharge Instructions (Addendum)
You don't need to use the antibiotic drops unless the drainage worsens or if you wake up with your eye matted shut.

## 2021-09-30 ENCOUNTER — Ambulatory Visit: Payer: Medicare Other | Admitting: *Deleted

## 2021-10-01 DIAGNOSIS — H1131 Conjunctival hemorrhage, right eye: Secondary | ICD-10-CM | POA: Diagnosis not present

## 2021-10-08 DIAGNOSIS — M25511 Pain in right shoulder: Secondary | ICD-10-CM | POA: Diagnosis not present

## 2021-10-08 DIAGNOSIS — M4180 Other forms of scoliosis, site unspecified: Secondary | ICD-10-CM | POA: Diagnosis not present

## 2021-10-14 ENCOUNTER — Ambulatory Visit: Payer: Self-pay | Admitting: *Deleted

## 2021-10-14 NOTE — Patient Outreach (Signed)
  Care Coordination   10/14/2021 Name: Priscilla Cantrell MRN: [id number] DOB: [birth year]   Care Coordination Outreach Attempts:  An unsuccessful telephone outreach was attempted for a scheduled appointment today.  Follow Up Plan:  Additional outreach attempts will be made to offer the patient care coordination information and services.   Encounter Outcome:  No Answer  Care Coordination Interventions Activated:  No   Care Coordination Interventions:  No, not indicated    Raina Mina, RN Care Management Coordinator [place] Office [phone]

## 2021-10-16 ENCOUNTER — Telehealth: Payer: Self-pay | Admitting: Family Medicine

## 2021-10-16 DIAGNOSIS — R0602 Shortness of breath: Secondary | ICD-10-CM | POA: Diagnosis not present

## 2021-10-16 DIAGNOSIS — Z20822 Contact with and (suspected) exposure to covid-19: Secondary | ICD-10-CM | POA: Diagnosis not present

## 2021-10-16 NOTE — Telephone Encounter (Signed)
LM for pt to return call but pt's phone cut off.  Patient needs to schedule an appt with provider to discuss next steps

## 2021-10-16 NOTE — Telephone Encounter (Signed)
Pt called and was exposed to  Covid, she is seeking advice on what to do and ask that Vevelyn Royals give her a call. I offered an appointment and advised her to quarantine for 5 days and to call us back if symptoms worsen. I offered virtual and in person visit pt declined at the time, and just ask that Vevelyn Royals give her a call.

## 2021-10-17 DIAGNOSIS — U071 COVID-19: Secondary | ICD-10-CM | POA: Diagnosis not present

## 2021-10-17 NOTE — Telephone Encounter (Signed)
Patient was seen at [facility]. Recent visit note received, covid test completed with negative results.

## 2021-10-18 ENCOUNTER — Telehealth: Payer: Self-pay

## 2021-10-18 NOTE — Chronic Care Management (AMB) (Signed)
  Care Coordination Note  10/18/2021 Name: Priscilla Cantrell MRN: [id number] DOB: 1935/06/30  Priscilla Cantrell is a 86 y.o. year old female who is a primary care patient of McGowen, Adrian Blackwater, MD and is actively engaged with the care Cantrell team. I reached out to Priscilla Cantrell by phone today to assist with re-scheduling an initial visit with the Priscilla Cantrell  Follow up plan: Unsuccessful telephone outreach attempt made.  The care Cantrell team will reach out to the patient again over the next 7 days.  If patient returns call to provider office, please advise to call Priscilla Cantrell at [place], Priscilla Cantrell  [place], [place] [address] Direct Dial: [phone] [email or web address]

## 2021-10-21 DIAGNOSIS — K6289 Other specified diseases of anus and rectum: Secondary | ICD-10-CM | POA: Diagnosis not present

## 2021-10-21 DIAGNOSIS — K644 Residual hemorrhoidal skin tags: Secondary | ICD-10-CM | POA: Diagnosis not present

## 2021-10-24 ENCOUNTER — Ambulatory Visit: Payer: Medicare Other | Admitting: Family Medicine

## 2021-10-24 DIAGNOSIS — R197 Diarrhea, unspecified: Secondary | ICD-10-CM | POA: Diagnosis not present

## 2021-10-24 NOTE — Progress Notes (Deleted)
OFFICE VISIT  10/24/2021  CC: No chief complaint on file.   HPI:    Patient is a 86 y.o. female who presents for ***  INTERIM HX: ***  Past Medical History:  Diagnosis Date   Allergic rhinitis    with upper airway cough: as of 07/2017 pulm f/u she was instructed to use astelin, flonase, and saline more consistently   BPPV (benign paroxysmal positional vertigo) 03/2018   C. difficile colitis    Chest pain, non-cardiac    Cardiac CT showed no signif obstructive dz, mildly elevated calcium level (Dr. [name]).   Chronic cystitis with hematuria    [name]   Chronic hypoxemic respiratory failure ([place]) 02/02/2015   2L oxygen 24/7   Chronic renal insufficiency, stage 2 (mild)    GFR approx 60   Cold hands    NCS/EMGs normal.  Hand dysesthesias per neuro--reassured   COPD (chronic obstructive pulmonary disease) ([place])    GOLD 4.  spirometry 01/10/09 FEV 0.97(52%), FEV1% 47.  With chronic bronchitis as of 07/2017.   DDD (degenerative disc disease), cervical    1998 MRI C5-6 impingemt (left).  MRI 2021 (neurol) 1 level canal stenosis, 1 level foraminal stenosis--->PT   DDD (degenerative disc disease), lumbar    Depression with anxiety 04/15/2011   Diarrheal disease Summer 2017   ? 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).  As of 10/05/15 pt reports GI dx'd her with C diff and rx'd more flagyl and she is also on cholestyramine.  As of 02/2016, pt's sx's resolved completely.   Diverticulosis of colon    Procto '97 and colonoscopy 2002   Dysplastic nevus of upper extremity 04/2014   R tricep ([name])   Edema of both lower extremities due to peripheral venous insufficiency    Varicosities bilat, swelling L>R.  R baker's cyst.  Got vein clinic eval 02/2018->sclerotherapy recommended but since no hemorrhage or ulceration, insurance will not cover the procedure.   Family  history of adverse reaction to anesthesia    mother died when patient age 86 receiving anesthesia for thyroid surgery   Fibrocystic breast disease    w/fibroadenoma.  Bx's showed NO atypia (Dr. Margot Chimes).  Mammo neg 2008.   GERD (gastroesophageal reflux disease)    History of double vision    Ophthalmologist, Dr. Herbert Deaner, is further evaluating this with MRI  orbits and limited brain (myesthenia gravis testing neg 07/2010)   History of home oxygen therapy    uses 2 liters at hs   Hypertension    EKG 03/2010 normal   Hypothyroidism    Hashimoto's, dx'd 1989   Idiopathic peripheral neuropathy 04/2018   Sensory (numb feet) S1 distribution bilat, c/w spinal stenosis sensory neuropathy (no pain). 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  Labs [place], MRI C spine->some spondylosis/stenosis.  UE NCS/EMS: normal 04/2019.   Lesion of right native kidney 2017   found on CT 02/2015.  F/u MRI 03/31/15 showed it to be smaller and less likely of any significance but repeat CT renal protocol in [place] recommended by radiology.  This was done 10/26/15 and showed no interval change in the lesion, but b/c the lesion enhances with contrast, radiol rec'd urol consult (b/c pap renal RCC could not be excluded).  Saw urol 03/06/16--stable on u/s 09/2016 and 09/2017.   OAB (overactive bladder)    Osteoarthritis, hip, bilateral    she  is s/p bilat THA as of 02/2018.   Osteoporosis    radius, T score -2.5->rec'd alendronate 08/2019->plan rpt DEXA 08/2021.   Pneumonia    Postmenopausal atrophic vaginitis    improved with estrace 2 X/week.   Prolapse of vaginal cuff after hysterectomy    10/2018: Dr. Wayne Both. Mild colpocele 04/2019 per GYN (no surgery)   Pulmonary nodule    CT chest 12/10, June 2011.  No nodule on CT 06/2010.   Recurrent Clostridium difficile diarrhea    Episode 09/19/2016: resolved with prolonged course of flagyl.  11/2016 recurrence treated with 10d of  Dificid (Fidaxomicin) by GI (Dr. Earlean Shawl).   Recurrent UTI    likely related to pt's atrophic vaginitis.  Urol started vaginal estrace 03/2016.   Rib fracture 09/2018   R 7th, laterally-->sustained when her dog pulled her over and she fell.   RLS (restless legs syndrome)    neuro->gabapentin trial 2020/21   Shingles 02/2014   R abd/flank   Spinal stenosis of lumbar region with neurogenic claudication    2008 MRI (L3-4, L4-5, L5-S1).  +S1 distribution sensory loss bilat    Past Surgical History:  Procedure Laterality Date   ABDOMINAL HYSTERECTOMY  1978   no BSO per pt--nonmalignant reasons   APPENDECTOMY     BREAST CYST EXCISION     Last screening mammogram 10/2010 was normal.   CHOLECYSTECTOMY  2001   COLONOSCOPY  04/2005; 12/06/2015   2007 NORMAL.  2017 adenomatous polyp x 1.  Mod sigmoid diverticulosis (Dr. Earlean Shawl).     DEXA  05/18/2017; 08/7019   2019 NORMAL (T-score 0.1).  08/2019->T score -2.5->alendronate started->plan Rpt 08/2021.   HIP CLOSED REDUCTION Right 12/25/2015   Procedure: CLOSED REDUCTION HIP;  Surgeon: Nicholes Stairs, MD;  Location: [facility];  Service: Orthopedics;  Laterality: Right;   NCV/EMS  10/2018   chronic and symmetric sensorimotor axonal polyneuropathy affecting the lower extremities   TONSILLECTOMY     TOTAL HIP ARTHROPLASTY Right 11/01/2014   Procedure: RIGHT TOTAL HIP ARTHROPLASTY;  Surgeon: Latanya Maudlin, MD;  Location: [facility];  Service: Orthopedics;  Laterality: Right;   TOTAL HIP ARTHROPLASTY Left 12/08/2017   Procedure: LEFT TOTAL HIP ARTHROPLASTY ANTERIOR APPROACH;  Surgeon: Paralee Cancel, MD;  Location: [facility];  Service: Orthopedics;  Laterality: Left;  70 mins   TRANSTHORACIC ECHOCARDIOGRAM  08/2014   2016 EF 60-65%, grade I diast dysfxn. 06/21/21 unchanged.   TUBAL LIGATION  1974    Outpatient Medications Prior to Visit  Medication Sig Dispense Refill   albuterol (PROAIR HFA) 108 (90 Base) MCG/ACT inhaler Inhale 2 puffs into the lungs every  4 (four) hours as needed for wheezing or shortness of breath. 8.5 g 1   albuterol (PROVENTIL) (2.5 MG/3ML) 0.083% nebulizer solution Take 3 mLs (2.5 mg total) by nebulization every 6 (six) hours as needed for wheezing or shortness of breath. 360 mL 5   alendronate (FOSAMAX) 70 MG tablet TAKE 1 TABLET(70 MG) BY MOUTH 1 TIME A WEEK WITH A FULL GLASS OF WATER AND ON AN EMPTY STOMACH 12 tablet 3   Artificial Tear Solution (SOOTHE XP) SOLN Place 1 drop into both eyes 2 (two) times daily as needed (irritation).     aspirin EC 81 MG tablet Take 81 mg by mouth daily.     azelastine (ASTELIN) 0.1 % nasal spray Place 2 sprays into both nostrils 2 (two) times daily. Use in each nostril as directed 30 mL 3   cetirizine (ZYRTEC) 10 MG tablet Take  1 tablet (10 mg total) by mouth daily. 30 tablet 6   ciprofloxacin (CILOXAN) 0.3 % ophthalmic solution Place 1 drop into the left eye every 4 (four) hours while awake. Administer 1 drop, every 2 hours, while awake, for 2 days. Then 1 drop, every 4 hours, while awake, for the next 5 days. 5 mL 0   clobetasol (TEMOVATE) 0.05 % external solution APPLY TO THE AFFECTED AREA EVERY DAY 50 mL 4   cycloSPORINE (RESTASIS OP) Apply to eye.     diazepam (VALIUM) 5 MG tablet TAKE 1 TABLET BY MOUTH DAILY AS NEEDED FOR ANXIETY AND INSOMNIA 30 tablet 5   fluticasone (CUTIVATE) 0.05 % cream Apply topically 2 (two) times daily. 30 g 1   fluticasone (FLONASE) 50 MCG/ACT nasal spray Place 2 sprays into both nostrils daily. 16 g 6   Fluticasone-Umeclidin-Vilant (TRELEGY ELLIPTA) 100-62.5-25 MCG/ACT AEPB Inhale 1 puff into the lungs daily. 180 each 3   furosemide (LASIX) 20 MG tablet 2 tabs qd alt with 1 tab qd 90 tablet 1   levothyroxine (SYNTHROID) 100 MCG tablet Take 1 tablet (100 mcg total) by mouth daily. 30 tablet 2   Lifitegrast (XIIDRA) 5 % SOLN Apply to eye in the morning and at bedtime.     nitrofurantoin (MACRODANTIN) 50 MG capsule TAKE 1 CAPSULE(50 MG) BY MOUTH AT BEDTIME 90  capsule 3   Probiotic Product (PROBIOTIC DAILY PO) Take 1 capsule by mouth daily.      roflumilast (DALIRESP) 500 MCG TABS tablet Take 1 tablet (500 mcg total) by mouth daily. 90 tablet 3   spironolactone (ALDACTONE) 25 MG tablet 1.5 tab po bid 270 tablet 0   triamcinolone cream (KENALOG) 0.1 % Apply 1 application topically 2 (two) times daily. 45 g 2   venlafaxine (EFFEXOR) 50 MG tablet Take 1 tablet (50 mg total) by mouth 2 (two) times daily. 180 tablet 3   Vibegron (GEMTESA) 75 MG TABS Take 1 capsule by mouth daily. 30 tablet 11   VISINE 0.05 % ophthalmic solution 1 drop  as needed     vitamin B-12 (CYANOCOBALAMIN) 100 MCG tablet Take 100 mcg by mouth daily.     VITAMIN D PO Take by mouth.     No facility-administered medications prior to visit.    Allergies  Allergen Reactions   Losartan Other (See Comments)    hyperkalemia   Penicillins Hives, Swelling and Rash    Has patient had a PCN reaction causing immediate rash, facial/tongue/throat swelling, SOB or lightheadedness with hypotension: YES Has patient had a PCN reaction causing severe rash involving mucus membranes or skin necrosis: NO Has patient had a PCN reaction that required hospitalization NO Has patient had a PCN reaction occurring within the last 10 years: NO If all of the above answers are "NO", then may proceed with Cephalosporin use.    Cefixime [Kdc:Cefixime] Other (See Comments)    unspecified   Gabapentin Other (See Comments)    Feels woozy   Indocin [Indomethacin] Other (See Comments)    Painful tongue and throat   Singulair [Montelukast Sodium]     Chest tightness, decreased mental clarity   Sulfa Antibiotics Other (See Comments)    unspecified   Ace Inhibitors Other (See Comments)    cough   Statins Other (See Comments)    Myalgias     ROS As per HPI  PE:    09/28/2021   12:34 PM 09/28/2021   12:31 PM 09/09/2021   10:18 AM  Vitals with  BMI  Height  '5\' 0"'$  '5\' 0"'$   Weight  120 lbs 119 lbs 6 oz   BMI  62.70 35.00  Systolic 938  182  Diastolic 93  62  Pulse 68  68     Physical Exam  ***  LABS:  Last CBC Lab Results  Component Value Date   WBC 6.9 06/07/2021   HGB 13.0 06/07/2021   HCT 40.0 06/07/2021   MCV 91.5 06/07/2021   MCH 29.7 06/07/2021   RDW 14.0 06/07/2021   PLT 254 99/37/1696   Last metabolic panel Lab Results  Component Value Date   GLUCOSE 83 07/03/2021   NA 142 07/03/2021   K 3.8 07/03/2021   CL 106 07/03/2021   CO2 25 07/03/2021   BUN 16 07/03/2021   CREATININE 0.98 07/03/2021   GFRNONAA 46 (L) 06/07/2021   CALCIUM 9.5 07/03/2021   PHOS 3.8 01/31/2013   PROT 6.6 06/28/2021   ALBUMIN 4.5 05/09/2020   BILITOT 0.6 06/28/2021   ALKPHOS 105 05/09/2020   AST 16 06/28/2021   ALT 13 06/28/2021   ANIONGAP 12 06/07/2021   Last thyroid functions Lab Results  Component Value Date   TSH 6.73 (H) 07/03/2021   T3TOTAL 97.2 07/11/2010   T4TOTAL 12.4 07/11/2010   IMPRESSION AND PLAN:  No problem-specific Assessment & Plan notes found for this encounter.   An After Visit Summary was printed and given to the patient.  FOLLOW UP: No follow-ups on file.  Signed:  Crissie Sickles, MD           10/24/2021

## 2021-10-24 NOTE — Chronic Care Management (AMB) (Signed)
  Care Coordination  Outreach Note  10/24/2021 Name: Priscilla Cantrell MRN: [id number] DOB: August 06, 1935   Care Coordination Outreach Attempts: A second unsuccessful outreach was attempted today to offer the patient with information about available care coordination services as a benefit of their health plan.     Follow Up Plan:  Additional outreach attempts will be made to offer the patient care coordination information and services.   Encounter Outcome:  No Answer  Sig Noreene Larsson, [place], [place] [address] Direct Dial: [phone] [email or web address]

## 2021-10-25 ENCOUNTER — Other Ambulatory Visit: Payer: Self-pay

## 2021-10-25 ENCOUNTER — Emergency Department (HOSPITAL_BASED_OUTPATIENT_CLINIC_OR_DEPARTMENT_OTHER)
Admission: EM | Admit: 2021-10-25 | Discharge: 2021-10-26 | Disposition: A | Discharge status: Home / Self Care | Payer: Medicare Other | Attending: Emergency Medicine | Admitting: Emergency Medicine

## 2021-10-25 ENCOUNTER — Encounter (HOSPITAL_BASED_OUTPATIENT_CLINIC_OR_DEPARTMENT_OTHER): Payer: Self-pay | Admitting: Emergency Medicine

## 2021-10-25 DIAGNOSIS — Z87891 Personal history of nicotine dependence: Secondary | ICD-10-CM | POA: Diagnosis not present

## 2021-10-25 DIAGNOSIS — Z79899 Other long term (current) drug therapy: Secondary | ICD-10-CM | POA: Insufficient documentation

## 2021-10-25 DIAGNOSIS — J449 Chronic obstructive pulmonary disease, unspecified: Secondary | ICD-10-CM | POA: Insufficient documentation

## 2021-10-25 DIAGNOSIS — K1379 Other lesions of oral mucosa: Secondary | ICD-10-CM | POA: Diagnosis present

## 2021-10-25 DIAGNOSIS — E039 Hypothyroidism, unspecified: Secondary | ICD-10-CM | POA: Insufficient documentation

## 2021-10-25 DIAGNOSIS — B370 Candidal stomatitis: Secondary | ICD-10-CM | POA: Diagnosis not present

## 2021-10-25 DIAGNOSIS — Z7951 Long term (current) use of inhaled steroids: Secondary | ICD-10-CM | POA: Diagnosis not present

## 2021-10-25 DIAGNOSIS — Z96642 Presence of left artificial hip joint: Secondary | ICD-10-CM | POA: Diagnosis not present

## 2021-10-25 DIAGNOSIS — Z7982 Long term (current) use of aspirin: Secondary | ICD-10-CM | POA: Insufficient documentation

## 2021-10-25 DIAGNOSIS — I10 Essential (primary) hypertension: Secondary | ICD-10-CM | POA: Diagnosis not present

## 2021-10-25 NOTE — ED Provider Notes (Signed)
[facility] Provider Note: Georgena Spurling, MD, FACEP  CSN: [id number] MRN: [id number] ARRIVAL: 10/25/21 at 2237 ROOM: [place]  Mouth Lesions   HISTORY OF PRESENT ILLNESS  10/25/21 11:50 PM Priscilla Cantrell is a 86 y.o. female who was darted on Paxlovid and Zithromax about 2 weeks ago for COVID.  She is also a long-term user of Trelegy, which contains an inhaled steroid fluticasone.  She is here with white patches in her throat and on her tongue that began today.  They are painful and she rates the pain in her throat as a 5 out of 10, aching and sore nature.  It is worse with swallowing.  These white patches can be scraped off the oral mucosa.   Past Medical History:  Diagnosis Date   Allergic rhinitis    with upper airway cough: as of 07/2017 pulm f/u she was instructed to use astelin, flonase, and saline more consistently   BPPV (benign paroxysmal positional vertigo) 03/2018   C. difficile colitis    Chest pain, non-cardiac    Cardiac CT showed no signif obstructive dz, mildly elevated calcium level (Dr. [name]).   Chronic cystitis with hematuria    [name]   Chronic hypoxemic respiratory failure ([place]) 02/02/2015   2L oxygen 24/7   Chronic renal insufficiency, stage 2 (mild)    GFR approx 60   Cold hands    NCS/EMGs normal.  Hand dysesthesias per neuro--reassured   COPD (chronic obstructive pulmonary disease) ([place])    GOLD 4.  spirometry 01/10/09 FEV 0.97(52%), FEV1% 47.  With chronic bronchitis as of 07/2017.   DDD (degenerative disc disease), cervical    1998 MRI C5-6 impingemt (left).  MRI 2021 (neurol) 1 level canal stenosis, 1 level foraminal stenosis--->PT   DDD (degenerative disc disease), lumbar    Depression with anxiety 04/15/2011   Diarrheal disease Summer 2017   ? 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).  As of  10/05/15 pt reports GI dx'd her with C diff and rx'd more flagyl and she is also on cholestyramine.  As of 02/2016, pt's sx's resolved completely.   Diverticulosis of colon    Procto '97 and colonoscopy 2002   Dysplastic nevus of upper extremity 04/2014   R tricep ([name])   Edema of both lower extremities due to peripheral venous insufficiency    Varicosities bilat, swelling L>R.  R baker's cyst.  Got vein clinic eval 02/2018->sclerotherapy recommended but since no hemorrhage or ulceration, insurance will not cover the procedure.   Family history of adverse reaction to anesthesia    mother died when patient age 67 receiving anesthesia for thyroid surgery   Fibrocystic breast disease    w/fibroadenoma.  Bx's showed NO atypia (Dr. Margot Chimes).  Mammo neg 2008.   GERD (gastroesophageal reflux disease)    History of double vision    Ophthalmologist, Dr. Herbert Deaner, is further evaluating this with MRI  orbits and limited brain (myesthenia gravis testing neg 07/2010)   History of home oxygen therapy    uses 2 liters at hs   Hypertension    EKG 03/2010 normal   Hypothyroidism    Hashimoto's, dx'd 1989   Idiopathic peripheral neuropathy 04/2018   Sensory (numb feet) S1 distribution bilat, c/w spinal stenosis sensory neuropathy (no pain). 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  Labs [place], MRI C spine->some spondylosis/stenosis.  UE NCS/EMS: normal 04/2019.   Lesion of right native kidney 2017   found on CT 02/2015.  F/u MRI 03/31/15 showed it to be smaller and less likely of any significance but repeat CT renal protocol in [place] recommended by radiology.  This was done 10/26/15 and showed no interval change in the lesion, but b/c the lesion enhances with contrast, radiol rec'd urol consult (b/c pap renal RCC could not be excluded).  Saw urol 03/06/16--stable on u/s 09/2016 and 09/2017.   OAB (overactive bladder)    Osteoarthritis, hip, bilateral     she is s/p bilat THA as of 02/2018.   Osteoporosis    radius, T score -2.5->rec'd alendronate 08/2019->plan rpt DEXA 08/2021.   Pneumonia    Postmenopausal atrophic vaginitis    improved with estrace 2 X/week.   Prolapse of vaginal cuff after hysterectomy    10/2018: Dr. FWayne Both Mild colpocele 04/2019 per GYN (no surgery)   Pulmonary nodule    CT chest 12/10, June 2011.  No nodule on CT 06/2010.   Recurrent Clostridium difficile diarrhea    Episode 09/19/2016: resolved with prolonged course of flagyl.  11/2016 recurrence treated with 10d of Dificid (Fidaxomicin) by GI (Dr. MEarlean Shawl.   Recurrent UTI    likely related to pt's atrophic vaginitis.  Urol started vaginal estrace 03/2016.   Rib fracture 09/2018   R 7th, laterally-->sustained when her dog pulled her over and she fell.   RLS (restless legs syndrome)    neuro->gabapentin trial 2020/21   Shingles 02/2014   R abd/flank   Spinal stenosis of lumbar region with neurogenic claudication    2008 MRI (L3-4, L4-5, L5-S1).  +S1 distribution sensory loss bilat    Past Surgical History:  Procedure Laterality Date   ABDOMINAL HYSTERECTOMY  1978   no BSO per pt--nonmalignant reasons   APPENDECTOMY     BREAST CYST EXCISION     Last screening mammogram 10/2010 was normal.   CHOLECYSTECTOMY  2001   COLONOSCOPY  04/2005; 12/06/2015   2007 NORMAL.  2017 adenomatous polyp x 1.  Mod sigmoid diverticulosis (Dr. MEarlean Shawl.     DEXA  05/18/2017; 08/7019   2019 NORMAL (T-score 0.1).  08/2019->T score -2.5->alendronate started->plan Rpt 08/2021.   HIP CLOSED REDUCTION Right 12/25/2015   Procedure: CLOSED REDUCTION HIP;  Surgeon: JNicholes Stairs MD;  Location: [facility];  Service: Orthopedics;  Laterality: Right;   NCV/EMS  10/2018   chronic and symmetric sensorimotor axonal polyneuropathy affecting the lower extremities   TONSILLECTOMY     TOTAL HIP ARTHROPLASTY Right 11/01/2014   Procedure: RIGHT TOTAL HIP ARTHROPLASTY;  Surgeon: RLatanya Maudlin MD;   Location: [facility];  Service: Orthopedics;  Laterality: Right;   TOTAL HIP ARTHROPLASTY Left 12/08/2017   Procedure: LEFT TOTAL HIP ARTHROPLASTY ANTERIOR APPROACH;  Surgeon: OParalee Cancel MD;  Location: [facility];  Service: Orthopedics;  Laterality: Left;  70 mins   TRANSTHORACIC ECHOCARDIOGRAM  08/2014   2016 EF 60-65%, grade I diast dysfxn. 06/21/21 unchanged.   TUBAL LIGATION  1974    Family History  Problem Relation Age of Onset   Goiter Mother    Psoriasis Father        od liver   Cirrhosis Father    Diabetes Daughter        Type 2   Stroke Maternal Grandfather    Stroke Paternal Grandmother    Hypertension Paternal Grandmother    Hernia Paternal Grandmother    Cancer Paternal Grandfather  lung   Cancer Maternal Aunt        cancer of the stomach    Social History   Tobacco Use   Smoking status: Former    Packs/day: 1.50    Years: 34.00    Total pack years: 51.00    Types: Cigarettes    Start date: 34    Quit date: 02/03/1990    Years since quitting: 31.7   Smokeless tobacco: Never  Vaping Use   Vaping Use: Never used  Substance Use Topics   Alcohol use: Yes    Comment: Rarely   Drug use: No    Prior to Admission medications   Medication Sig Start Date End Date Taking? Authorizing Provider  nystatin (MYCOSTATIN) 100000 UNIT/ML suspension Swish 5 mL in mouth for several minutes then gargle and swallow.  Do this 4 times daily for 7 to 14 days. 10/26/21  Yes Naleah Kofoed, MD  albuterol (PROAIR HFA) 108 (90 Base) MCG/ACT inhaler Inhale 2 puffs into the lungs every 4 (four) hours as needed for wheezing or shortness of breath. 08/08/21   Chesley Mires, MD  albuterol (PROVENTIL) (2.5 MG/3ML) 0.083% nebulizer solution Take 3 mLs (2.5 mg total) by nebulization every 6 (six) hours as needed for wheezing or shortness of breath. 09/09/21   Chesley Mires, MD  alendronate (FOSAMAX) 70 MG tablet TAKE 1 TABLET(70 MG) BY MOUTH 1 TIME A WEEK WITH A FULL GLASS OF WATER AND ON AN EMPTY  STOMACH 09/10/20   McGowen, Adrian Blackwater, MD  Artificial Tear Solution (SOOTHE XP) SOLN Place 1 drop into both eyes 2 (two) times daily as needed (irritation).    [provider]  aspirin EC 81 MG tablet Take 81 mg by mouth daily.    [provider]  azelastine (ASTELIN) 0.1 % nasal spray Place 2 sprays into both nostrils 2 (two) times daily. Use in each nostril as directed 05/22/20   McGowen, Adrian Blackwater, MD  cetirizine (ZYRTEC) 10 MG tablet Take 1 tablet (10 mg total) by mouth daily. 05/22/20   McGowen, Adrian Blackwater, MD  ciprofloxacin (CILOXAN) 0.3 % ophthalmic solution Place 1 drop into the left eye every 4 (four) hours while awake. Administer 1 drop, every 2 hours, while awake, for 2 days. Then 1 drop, every 4 hours, while awake, for the next 5 days. 09/28/21   Isla Pence, MD  clobetasol (TEMOVATE) 0.05 % external solution APPLY TO THE AFFECTED AREA EVERY DAY 03/08/20   Sheffield, Vida Roller R, PA-C  cycloSPORINE (RESTASIS OP) Apply to eye.    [provider]  diazepam (VALIUM) 5 MG tablet TAKE 1 TABLET BY MOUTH DAILY AS NEEDED FOR ANXIETY AND INSOMNIA 07/03/21   McGowen, Adrian Blackwater, MD  fluticasone (CUTIVATE) 0.05 % cream Apply topically 2 (two) times daily. 05/15/21   McGowen, Adrian Blackwater, MD  fluticasone (FLONASE) 50 MCG/ACT nasal spray Place 2 sprays into both nostrils daily. 05/22/20   McGowen, Adrian Blackwater, MD  Fluticasone-Umeclidin-Vilant (TRELEGY ELLIPTA) 100-62.5-25 MCG/ACT AEPB Inhale 1 puff into the lungs daily. 04/10/21   Chesley Mires, MD  furosemide (LASIX) 20 MG tablet 2 tabs qd alt with 1 tab qd 08/02/21   McGowen, Adrian Blackwater, MD  levothyroxine (SYNTHROID) 100 MCG tablet Take 1 tablet (100 mcg total) by mouth daily. 07/03/21   McGowen, Adrian Blackwater, MD  Lifitegrast Shirley Friar) 5 % SOLN Apply to eye in the morning and at bedtime.    [provider]  nitrofurantoin (MACRODANTIN) 50 MG capsule TAKE 1 CAPSULE(50 MG) BY  MOUTH AT BEDTIME 07/03/21   McGowen, Adrian Blackwater, MD  Probiotic Product  (PROBIOTIC DAILY PO) Take 1 capsule by mouth daily.     [provider]  roflumilast (DALIRESP) 500 MCG TABS tablet Take 1 tablet (500 mcg total) by mouth daily. 05/31/20   Martyn Ehrich, NP  spironolactone (ALDACTONE) 25 MG tablet 1.5 tab po bid 09/10/21   McGowen, Adrian Blackwater, MD  triamcinolone cream (KENALOG) 0.1 % Apply 1 application topically 2 (two) times daily. 08/17/20   Kuneff, Renee A, DO  venlafaxine (EFFEXOR) 50 MG tablet Take 1 tablet (50 mg total) by mouth 2 (two) times daily. 03/03/21   McGowen, Adrian Blackwater, MD  Vibegron (GEMTESA) 75 MG TABS Take 1 capsule by mouth daily. 11/23/20   McKenzie, [name], MD  VISINE 0.05 % ophthalmic solution 1 drop  as needed 12/14/19   [provider]  vitamin B-12 (CYANOCOBALAMIN) 100 MCG tablet Take 100 mcg by mouth daily.    [provider]  VITAMIN D PO Take by mouth.    [provider]    Allergies Losartan, Penicillins, Cefixime [kdc:cefixime], Gabapentin, Indocin [indomethacin], Singulair [montelukast sodium], Sulfa antibiotics, Ace inhibitors, and Statins   REVIEW OF SYSTEMS  Negative except as noted here or in the History of Present Illness.   PHYSICAL EXAMINATION  Initial Vital Signs Blood pressure (!) 187/80, pulse 67, temperature 97.6 F (36.4 C), temperature source Oral, resp. rate 17, height 5' (1.524 m), weight 54.4 kg, SpO2 96 %.  Examination General: Well-developed, well-nourished female in no acute distress; appearance consistent with age of record HENT: normocephalic; atraumatic; white patches of oropharyngeal mucosae which may be scraped off with a tongue depressor Eyes: Normal appearance Neck: supple Heart: regular rate and rhythm Lungs: clear to auscultation bilaterally Abdomen: soft; nondistended; nontender; bowel sounds present Extremities: No deformity; full range of motion; pulses normal Neurologic: Awake, alert and oriented; motor function intact in all extremities and symmetric;  no facial droop Skin: Warm and dry Psychiatric: Normal mood and affect   RESULTS  Summary of this visit's results, reviewed and interpreted by myself:   EKG Interpretation  Date/Time:    Ventricular Rate:    PR Interval:    QRS Duration:   QT Interval:    QTC Calculation:   R Axis:     Text Interpretation:         Laboratory Studies: No results found. However, due to the size of the patient record, not all encounters were searched. Please check Results Review for a complete set of results. Imaging Studies: No results found.  ED COURSE and MDM  Nursing notes, initial and subsequent vitals signs, including pulse oximetry, reviewed and interpreted by myself.  Vitals:   10/25/21 2242 10/25/21 2244  BP: (!) 187/80   Pulse: 67   Resp: 17   Temp: 97.6 F (36.4 C)   TempSrc: Oral   SpO2: 96%   Weight:  54.4 kg  Height:  5' (1.524 m)   Medications - No data to display  The patient's presentation is consistent with oral thrush.  While Trelegy can cause thrush especially if 1 uses it without rinsing the mouth afterwards she has been on this for a prolonged period of time.  Perhaps the combination of this and her recent Zithromax course could have caused the thrush we will start her on nystatin rinse.  PROCEDURES  Procedures   ED DIAGNOSES     ICD-10-CM   1. Oral thrush  B37.0  Honor Fairbank, Jenny Reichmann, MD 10/26/21 [phone]

## 2021-10-25 NOTE — ED Triage Notes (Signed)
Patient reports recently on antibiotics due to having COVID. Patient states her throat/mouth has been very dry and put moisturizer in her mouth. Patient has white patches to tongue and to back of her throat.

## 2021-10-26 MED ORDER — NYSTATIN 100000 UNIT/ML MT SUSP: OROMUCOSAL | 1 refills | Status: DC

## 2021-10-28 ENCOUNTER — Telehealth: Payer: Self-pay

## 2021-10-28 ENCOUNTER — Encounter: Payer: Self-pay | Admitting: Family Medicine

## 2021-10-28 ENCOUNTER — Telehealth: Payer: Self-pay | Admitting: Pulmonary Disease

## 2021-10-28 ENCOUNTER — Ambulatory Visit (INDEPENDENT_AMBULATORY_CARE_PROVIDER_SITE_OTHER): Payer: Medicare Other | Admitting: Family Medicine

## 2021-10-28 VITALS — BP 101/58 | HR 72 | Temp 98.3°F | Ht 60.0 in

## 2021-10-28 DIAGNOSIS — F331 Major depressive disorder, recurrent, moderate: Secondary | ICD-10-CM

## 2021-10-28 DIAGNOSIS — N3001 Acute cystitis with hematuria: Secondary | ICD-10-CM

## 2021-10-28 DIAGNOSIS — L304 Erythema intertrigo: Secondary | ICD-10-CM

## 2021-10-28 DIAGNOSIS — K6289 Other specified diseases of anus and rectum: Secondary | ICD-10-CM

## 2021-10-28 DIAGNOSIS — Z87440 Personal history of urinary (tract) infections: Secondary | ICD-10-CM

## 2021-10-28 DIAGNOSIS — R410 Disorientation, unspecified: Secondary | ICD-10-CM | POA: Diagnosis not present

## 2021-10-28 LAB — POCT URINALYSIS DIPSTICK
Bilirubin, UA: NEGATIVE
Glucose, UA: NEGATIVE
Ketones, UA: NEGATIVE
Nitrite, UA: NEGATIVE
Protein, UA: POSITIVE — AB
Spec Grav, UA: 1.02 (ref 1.010–1.025)
Urobilinogen, UA: 0.2 E.U./dL
pH, UA: 5.5 (ref 5.0–8.0)

## 2021-10-28 MED ORDER — NYSTATIN 100000 UNIT/ML MT SUSP: 5.0000 mL | Freq: Four times a day (QID) | OROMUCOSAL | 0 refills | Status: AC

## 2021-10-28 MED ORDER — CIPROFLOXACIN HCL 500 MG PO TABS: 500.0000 mg | ORAL_TABLET | Freq: Two times a day (BID) | ORAL | 0 refills | Status: AC

## 2021-10-28 MED ORDER — AZITHROMYCIN 250 MG PO TABS: ORAL_TABLET | ORAL | 0 refills | Status: DC

## 2021-10-28 NOTE — Telephone Encounter (Signed)
Pt diagnosed with Covid two weeks ago.  Has thrush from inhaler.  Still congested.  Please advise.  Requesting antibiotic.  Please advise.  [facility]   Incoming call    Primary Pulmonologist: [name]  Last office visit and with whom: [name] 09/09/21 What do we see them for (pulmonary problems): emphysema and chronic resp failure and upper airway cough  Last OV assessment/plan: see below   Was appointment offered to patient (explain)?  None available in [facility] office    Reason for call: Was covid + 2 weeks ago. She states she has a lot of congestion. Oxygen level was below 90% when she woke up this morning but she cant remember what exactly it was. Has a runny nose. Has a cough but is not able to get it up.   Patient is requesting z pak. Please advise    Allergies  Allergen Reactions   Losartan Other (See Comments)    hyperkalemia   Penicillins Hives, Swelling and Rash    Has patient had a PCN reaction causing immediate rash, facial/tongue/throat swelling, SOB or lightheadedness with hypotension: YES Has patient had a PCN reaction causing severe rash involving mucus membranes or skin necrosis: NO Has patient had a PCN reaction that required hospitalization NO Has patient had a PCN reaction occurring within the last 10 years: NO If all of the above answers are "NO", then may proceed with Cephalosporin use.    Cefixime [Kdc:Cefixime] Other (See Comments)    unspecified   Gabapentin Other (See Comments)    Feels woozy   Indocin [Indomethacin] Other (See Comments)    Painful tongue and throat   Singulair [Montelukast Sodium]     Chest tightness, decreased mental clarity   Sulfa Antibiotics Other (See Comments)    unspecified   Ace Inhibitors Other (See Comments)    cough   Statins Other (See Comments)    Myalgias     Immunization History  Administered Date(s) Administered   Fluad Quad(high Dose 65+) 10/05/2018, 10/25/2019, 12/07/2020   Influenza Split 11/04/2010,  10/23/2011   Influenza Whole 10/04/2009   Influenza, High Dose Seasonal PF 11/07/2016, 11/04/2017   Influenza,inj,Quad PF,6+ Mos 10/07/2012, 10/11/2014   Influenza-Unspecified 11/07/2013, 11/01/2015   Moderna Sars-Covid-2 Vaccination 02/14/2019, 03/15/2019, 12/15/2019   PNEUMOCOCCAL CONJUGATE-20 07/17/2020   Pfizer Covid-19 Vaccine Bivalent Booster 10yr & up 12/19/2020   Pneumococcal Conjugate-13 04/29/2013   Pneumococcal Polysaccharide-23 02/03/2005, 11/28/2010, 11/01/2018   Td 02/04/2008, 03/07/2010   Tdap 03/07/2020   Zoster Recombinat (Shingrix) 10/30/2016, 12/29/2016   Zoster, Live 02/10/2013    Assessment/Plan:    GOLD 4 COPD with chronic bronchitis. - continue trelegy and daliresp - will add prn albuterol nebulizer - if symptoms progress, then could change to maintenance nebulizer medicines with pulmicort/yupelri/perforomist   Allergic rhinitis with upper airway cough. - prn zyrtec, azelastine, flonase   Laryngopharyngeal reflux. - she is to follow up with GI   Chronic respiratory failure with hypoxia. - goal SpO2 > 90% - continue 2 liters oxygen at night   Time Spent Involved in Patient Care on Day of Examination:  36 minutes   Follow up:    Patient Instructions  Use albuterol by nebulizer as needed for cough, wheeze, chest congestion or shortness of breath.   Sip water when you have the urge to cough.   Uses sugarless candy to help keep your mouth moist.   Salt water gargle once or twice per day as needed.   Try using 1 teaspoon of  local honey as needed to help with cough.   Follow up in 3 months.   Medication List:    Allergies as of 09/09/2021         Reactions    Losartan Other (See Comments)    hyperkalemia    Penicillins Hives, Swelling, Rash    Has patient had a PCN reaction causing immediate rash, facial/tongue/throat swelling, SOB or lightheadedness with hypotension: YES Has patient had a PCN reaction causing severe rash involving mucus  membranes or skin necrosis: NO Has patient had a PCN reaction that required hospitalization NO Has patient had a PCN reaction occurring within the last 10 years: NO If all of the above answers are "NO", then may proceed with Cephalosporin use.    Cefixime [kdc:cefixime] Other (See Comments)    unspecified    Gabapentin Other (See Comments)    Feels woozy    Indocin [indomethacin] Other (See Comments)    Painful tongue and throat    Singulair [montelukast Sodium]      Chest tightness, decreased mental clarity    Sulfa Antibiotics Other (See Comments)    unspecified    Ace Inhibitors Other (See Comments)    cough    Statins Other (See Comments)    Myalgias            Medication List           Accurate as of September 09, 2021 11:08 AM. If you have any questions, ask your nurse or doctor.              albuterol 108 (90 Base) MCG/ACT inhaler Commonly known as: ProAir HFA Inhale 2 puffs into the lungs every 4 (four) hours as needed for wheezing or shortness of breath. What changed: Another medication with the same name was added. Make sure you understand how and when to take each. Changed by: Chesley Mires, MD    albuterol (2.5 MG/3ML) 0.083% nebulizer solution Commonly known as: PROVENTIL Take 3 mLs (2.5 mg total) by nebulization every 6 (six) hours as needed for wheezing or shortness of breath. What changed: You were already taking a medication with the same name, and this prescription was added. Make sure you understand how and when to take each. Changed by: Chesley Mires, MD    alendronate 70 MG tablet Commonly known as: FOSAMAX TAKE 1 TABLET(70 MG) BY MOUTH 1 TIME A WEEK WITH A FULL GLASS OF WATER AND ON AN EMPTY STOMACH    aspirin EC 81 MG tablet Take 81 mg by mouth daily.    azelastine 0.1 % nasal spray Commonly known as: ASTELIN Place 2 sprays into both nostrils 2 (two) times daily. Use in each nostril as directed    cetirizine 10 MG tablet Commonly known as:  ZYRTEC Take 1 tablet (10 mg total) by mouth daily.    clobetasol 0.05 % external solution Commonly known as: TEMOVATE APPLY TO THE AFFECTED AREA EVERY DAY    diazepam 5 MG tablet Commonly known as: VALIUM TAKE 1 TABLET BY MOUTH DAILY AS NEEDED FOR ANXIETY AND INSOMNIA    fluticasone 0.05 % cream Commonly known as: CUTIVATE Apply topically 2 (two) times daily.    fluticasone 50 MCG/ACT nasal spray Commonly known as: FLONASE Place 2 sprays into both nostrils daily.    furosemide 20 MG tablet Commonly known as: LASIX 2 tabs qd alt with 1 tab qd    Gemtesa 75 MG Tabs Generic drug: Vibegron Take 1 capsule by mouth daily.  levothyroxine 100 MCG tablet Commonly known as: SYNTHROID Take 1 tablet (100 mcg total) by mouth daily.    nitrofurantoin 50 MG capsule Commonly known as: MACRODANTIN TAKE 1 CAPSULE(50 MG) BY MOUTH AT BEDTIME    PROBIOTIC DAILY PO Take 1 capsule by mouth daily.    RESTASIS OP Apply to eye.    roflumilast 500 MCG Tabs tablet Commonly known as: DALIRESP Take 1 tablet (500 mcg total) by mouth daily.    Soothe XP Soln Place 1 drop into both eyes 2 (two) times daily as needed (irritation).    spironolactone 25 MG tablet Commonly known as: ALDACTONE 1.5 tab po bid    Trelegy Ellipta 100-62.5-25 MCG/ACT Aepb Generic drug: Fluticasone-Umeclidin-Vilant Inhale 1 puff into the lungs daily.    triamcinolone cream 0.1 % Commonly known as: KENALOG Apply 1 application topically 2 (two) times daily.    venlafaxine 50 MG tablet Commonly known as: EFFEXOR Take 1 tablet (50 mg total) by mouth 2 (two) times daily.    Visine 0.05 % ophthalmic solution Generic drug: tetrahydrozoline 1 drop  as needed    vitamin B-12 100 MCG tablet Commonly known as: CYANOCOBALAMIN Take 100 mcg by mouth daily.    VITAMIN D PO Take by mouth.    Xiidra 5 % Soln Generic drug: Lifitegrast Apply to eye in the morning and at bedtime.

## 2021-10-28 NOTE — Telephone Encounter (Signed)
Patient called back- connection was bad, couldn't hear her

## 2021-10-28 NOTE — Telephone Encounter (Signed)
Please send script for zpak.  Please send script for nystatin 5 ml swish and swallow qid for 5 days.

## 2021-10-28 NOTE — Patient Instructions (Signed)
Check for venlafaxine pills

## 2021-10-28 NOTE — Telephone Encounter (Signed)
Pt walked into office. PCP agreed to see pt in office today.  [facility] - Client TELEPHONE ADVICE RECORD [facility] Patient Name: Priscilla Cantrell Gender: Female DOB: 1935/04/01 Age: 86 Y 47 M 24 D Return Phone Number: [phone] (Primary), [phone] (Secondary) Address: City/ State/ Zip: [place]  [address] Client [facility] - Client Client Site [facility] - Day Provider Crissie Sickles - MD Contact Type Call Who Is Calling Patient / Member / Family / Caregiver Call Type Triage / Clinical Relationship To Patient Self Return Phone Number [phone] (Primary) Chief Complaint CONFUSION - new onset Reason for Call Symptomatic / Request for [name] says she has been confused lately- she says her medication seems like it is not helping. She is asking for blood work. This morning she was very confused and did not remember how to charge her phone. Translation No Nurse Assessment Nurse: Mancel Bale, RN, Traci Date/Time (Eastern Time): 10/28/2021 1:48:16 PM Confirm and document reason for call. If symptomatic, describe symptoms. ---Caller says she has been confused lately- she says her medication seems like it is not helping. She is asking for blood work. This morning she was very confused and did not remember how to charge her phone. States she has COPD and bronchitis. States thought meds may be causing the confusion. Does the patient have any new or worsening symptoms? ---Yes Will a triage be completed? ---Yes Related visit to physician within the last 2 weeks? ---Yes Does the PT have any chronic conditions? (i.e. diabetes, asthma, this includes High risk factors for pregnancy, etc.) ---Yes List chronic conditions. ---COPD Is this a behavioral health or substance abuse call? ---No Guidelines Guideline Title Affirmed Question Affirmed Notes Nurse Date/Time  (Eastern Time) Confusion - Delirium [1] Acting confused (e.g., disoriented, slurred speech) AND [2] brief (now gone) Mancel Bale, Therapist, sports, [facility] 10/28/2021 1:50:30 PM PLEASE NOTE: All timestamps contained within this report are represented as [place] Standard Time. CONFIDENTIALTY NOTICE: This fax transmission is intended only for the addressee. It contains information that is legally privileged, confidential or otherwise protected from use or disclosure. If you are not the intended recipient, you are strictly prohibited from reviewing, disclosing, copying using or disseminating any of this information or taking any action in reliance on or regarding this information. If you have received this fax in error, please notify us immediately by telephone so that we can arrange for its return to [place]. Phone: [phone], Toll-Free: [phone], Fax: [phone] Page: 2 of 2 Call Id: [id number] Pink. Time [name] Time) Disposition Final User 10/28/2021 1:45:35 PM Send to Urgent Queue Bettye Boeck 10/28/2021 1:54:50 PM See HCP within 4 Hours (or PCP triage) Yes Mancel Bale, RN, Traci Final Disposition 10/28/2021 1:54:50 PM See HCP within 4 Hours (or PCP triage) Yes Mancel Bale, RN, Traci Caller Disagree/Comply Comply Caller Understands Yes PreDisposition InappropriateToAsk Care Advice Given Per Guideline SEE HCP (OR PCP TRIAGE) WITHIN 4 HOURS: * IF OFFICE WILL BE OPEN: You need to be seen within the next 3 or 4 hours. Call your doctor (or NP/PA) now or as soon as the office opens. CALL BACK IF: * You become worse CARE ADVICE given per Confusion-Delirium (Adult) guideline. Referrals Warm transfer to backline

## 2021-10-28 NOTE — Progress Notes (Unsigned)
OFFICE VISIT  10/29/2021  CC:  Chief Complaint  Patient presents with   Confusion    Priscilla Cantrell had ua dip for UTI check.    Patient is a 86 y.o. female who presents for feeling confused.  HPI: Priscilla Cantrell is feeling significantly depressed.  She is having periods of feeling overwhelmed with things that should be easy for her such as this morning trying to charge her phone. Today she states she could not find her Effexor pill bottle this morning and she cannot be sure that she has even been taking her meds correctly lately. She does have rectal pain lately.  She recently had a COVID infection about 2+ weeks ago.  She states this is mild, she was treated with Paxlovid.  Says she is completely asymptomatic now. Recently went back to urgent care for complaint of some rectal pain and fecal leakage.  Fecal leakage is not entirely new for her.  She had no blood.   Urgent care note states that some internal and external hemorrhoids were present on exam.  Priscilla Cantrell describes some sort of procedure being done that caused pain but there is no documentation of any procedure in the note.  She denies any dysuria, flank pain, or abdominal pain.  She has chronic urinary urgency and frequency.  She has a history of recurrent UTI and states she does not feel like she currently has 1.  Review of systems: As above, plus no fever, no nausea or vomiting, no diarrhea or constipation, no shortness of breath or chest pain, no headaches, no dizziness, no vision or hearing abnormalities, no focal weakness, no tremor.   Past Medical History:  Diagnosis Date   Allergic rhinitis    with upper airway cough: as of 07/2017 pulm f/u she was instructed to use astelin, flonase, and saline more consistently   BPPV (benign paroxysmal positional vertigo) 03/2018   C. difficile colitis    Chest pain, non-cardiac    Cardiac CT showed no signif obstructive dz, mildly elevated calcium level (Dr. [name]).   Chronic cystitis with hematuria     [name]   Chronic hypoxemic respiratory failure ([place]) 02/02/2015   2L oxygen 24/7   Chronic renal insufficiency, stage 2 (mild)    GFR approx 60   Cold hands    NCS/EMGs normal.  Hand dysesthesias per neuro--reassured   COPD (chronic obstructive pulmonary disease) ([place])    GOLD 4.  spirometry 01/10/09 FEV 0.97(52%), FEV1% 47.  With chronic bronchitis as of 07/2017.   DDD (degenerative disc disease), cervical    1998 MRI C5-6 impingemt (left).  MRI 2021 (neurol) 1 level canal stenosis, 1 level foraminal stenosis--->Priscilla Cantrell   DDD (degenerative disc disease), lumbar    Depression with anxiety 04/15/2011   Diarrheal disease Summer 2017   ? 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).  As of 10/05/15 Priscilla Cantrell reports GI dx'd her with C diff and rx'd more flagyl and she is also on cholestyramine.  As of 02/2016, Priscilla Cantrell's sx's resolved completely.   Diverticulosis of colon    Procto '97 and colonoscopy 2002   Dysplastic nevus of upper extremity 04/2014   R tricep ([name])   Edema of both lower extremities due to peripheral venous insufficiency    Varicosities bilat, swelling L>R.  R baker's cyst.  Got vein clinic eval 02/2018->sclerotherapy recommended but since no hemorrhage or ulceration, insurance will not cover the procedure.   Family  history of adverse reaction to anesthesia    mother died when patient age 12 receiving anesthesia for thyroid surgery   Fibrocystic breast disease    w/fibroadenoma.  Bx's showed NO atypia (Dr. Margot Chimes).  Mammo neg 2008.   GERD (gastroesophageal reflux disease)    History of double vision    Ophthalmologist, Dr. Herbert Deaner, is further evaluating this with MRI  orbits and limited brain (myesthenia gravis testing neg 07/2010)   History of home oxygen therapy    uses 2 liters at hs   Hypertension    EKG 03/2010 normal   Hypothyroidism    Hashimoto's, dx'd 1989    Idiopathic peripheral neuropathy 04/2018   Sensory (numb feet) S1 distribution bilat, c/w spinal stenosis sensory neuropathy (no pain). 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  Labs [place], MRI C spine->some spondylosis/stenosis.  UE NCS/EMS: normal 04/2019.   Lesion of right native kidney 2017   found on CT 02/2015.  F/u MRI 03/31/15 showed it to be smaller and less likely of any significance but repeat CT renal protocol in 20mowas recommended by radiology.  This was done 10/26/15 and showed no interval change in the lesion, but b/c the lesion enhances with contrast, radiol rec'd urol consult (b/c pap renal RCC could not be excluded).  Saw urol 03/06/16--stable on u/s 09/2016 and 09/2017.   OAB (overactive bladder)    Osteoarthritis, hip, bilateral    she is s/p bilat THA as of 02/2018.   Osteoporosis    radius, T score -2.5->rec'd alendronate 08/2019->plan rpt DEXA 08/2021.   Pneumonia    Postmenopausal atrophic vaginitis    improved with estrace 2 X/week.   Prolapse of vaginal cuff after hysterectomy    10/2018: Dr. FWayne Both Mild colpocele 04/2019 per GYN (no surgery)   Pulmonary nodule    CT chest 12/10, June 2011.  No nodule on CT 06/2010.   Recurrent Clostridium difficile diarrhea    Episode 09/19/2016: resolved with prolonged course of flagyl.  11/2016 recurrence treated with 10d of Dificid (Fidaxomicin) by GI (Dr. MEarlean Shawl.   Recurrent UTI    likely related to Priscilla Cantrell's atrophic vaginitis.  Urol started vaginal estrace 03/2016.   Rib fracture 09/2018   R 7th, laterally-->sustained when her dog pulled her over and she fell.   RLS (restless legs syndrome)    neuro->gabapentin trial 2020/21   Shingles 02/2014   R abd/flank   Spinal stenosis of lumbar region with neurogenic claudication    2008 MRI (L3-4, L4-5, L5-S1).  +S1 distribution sensory loss bilat    Past Surgical History:  Procedure Laterality Date   ABDOMINAL HYSTERECTOMY  1978    no BSO per Priscilla Cantrell--nonmalignant reasons   APPENDECTOMY     BREAST CYST EXCISION     Last screening mammogram 10/2010 was normal.   CHOLECYSTECTOMY  2001   COLONOSCOPY  04/2005; 12/06/2015   2007 NORMAL.  2017 adenomatous polyp x 1.  Mod sigmoid diverticulosis (Dr. MEarlean Shawl.     DEXA  05/18/2017; 08/7019   2019 NORMAL (T-score 0.1).  08/2019->T score -2.5->alendronate started->plan Rpt 08/2021.   HIP CLOSED REDUCTION Right 12/25/2015   Procedure: CLOSED REDUCTION HIP;  Surgeon: JNicholes Stairs MD;  Location: [facility];  Service: Orthopedics;  Laterality: Right;   NCV/EMS  10/2018   chronic and symmetric sensorimotor axonal polyneuropathy affecting the lower extremities   TONSILLECTOMY     TOTAL HIP ARTHROPLASTY Right 11/01/2014   Procedure: RIGHT TOTAL HIP ARTHROPLASTY;  Surgeon: RLatanya Maudlin  MD;  Location: [facility];  Service: Orthopedics;  Laterality: Right;   TOTAL HIP ARTHROPLASTY Left 12/08/2017   Procedure: LEFT TOTAL HIP ARTHROPLASTY ANTERIOR APPROACH;  Surgeon: Paralee Cancel, MD;  Location: [facility];  Service: Orthopedics;  Laterality: Left;  70 mins   TRANSTHORACIC ECHOCARDIOGRAM  08/2014   2016 EF 60-65%, grade I diast dysfxn. 06/21/21 unchanged.   TUBAL LIGATION  1974    Outpatient Medications Prior to Visit  Medication Sig Dispense Refill   albuterol (PROAIR HFA) 108 (90 Base) MCG/ACT inhaler Inhale 2 puffs into the lungs every 4 (four) hours as needed for wheezing or shortness of breath. 8.5 g 1   alendronate (FOSAMAX) 70 MG tablet TAKE 1 TABLET(70 MG) BY MOUTH 1 TIME A WEEK WITH A FULL GLASS OF WATER AND ON AN EMPTY STOMACH 12 tablet 3   Artificial Tear Solution (SOOTHE XP) SOLN Place 1 drop into both eyes 2 (two) times daily as needed (irritation).     aspirin EC 81 MG tablet Take 81 mg by mouth daily.     azelastine (ASTELIN) 0.1 % nasal spray Place 2 sprays into both nostrils 2 (two) times daily. Use in each nostril as directed 30 mL 3   cetirizine (ZYRTEC) 10 MG tablet Take 1  tablet (10 mg total) by mouth daily. 30 tablet 6   ciprofloxacin (CILOXAN) 0.3 % ophthalmic solution Place 1 drop into the left eye every 4 (four) hours while awake. Administer 1 drop, every 2 hours, while awake, for 2 days. Then 1 drop, every 4 hours, while awake, for the next 5 days. 5 mL 0   clobetasol (TEMOVATE) 0.05 % external solution APPLY TO THE AFFECTED AREA EVERY DAY 50 mL 4   cycloSPORINE (RESTASIS OP) Apply to eye.     diazepam (VALIUM) 5 MG tablet TAKE 1 TABLET BY MOUTH DAILY AS NEEDED FOR ANXIETY AND INSOMNIA 30 tablet 5   fluticasone (CUTIVATE) 0.05 % cream Apply topically 2 (two) times daily. 30 g 1   fluticasone (FLONASE) 50 MCG/ACT nasal spray Place 2 sprays into both nostrils daily. 16 g 6   Fluticasone-Umeclidin-Vilant (TRELEGY ELLIPTA) 100-62.5-25 MCG/ACT AEPB Inhale 1 puff into the lungs daily. 180 each 3   furosemide (LASIX) 20 MG tablet 2 tabs qd alt with 1 tab qd 90 tablet 1   levothyroxine (SYNTHROID) 100 MCG tablet Take 1 tablet (100 mcg total) by mouth daily. 30 tablet 2   Lifitegrast (XIIDRA) 5 % SOLN Apply to eye in the morning and at bedtime.     nitrofurantoin (MACRODANTIN) 50 MG capsule TAKE 1 CAPSULE(50 MG) BY MOUTH AT BEDTIME 90 capsule 3   nystatin (MYCOSTATIN) 100000 UNIT/ML suspension Swish 5 mL in mouth for several minutes then gargle and swallow.  Do this 4 times daily for 7 to 14 days. 140 mL 1   nystatin (MYCOSTATIN) 100000 UNIT/ML suspension Take 5 mLs (500,000 Units total) by mouth 4 (four) times daily for 5 days. 60 mL 0   Probiotic Product (PROBIOTIC DAILY PO) Take 1 capsule by mouth daily.      roflumilast (DALIRESP) 500 MCG TABS tablet Take 1 tablet (500 mcg total) by mouth daily. 90 tablet 3   spironolactone (ALDACTONE) 25 MG tablet 1.5 tab po bid 270 tablet 0   triamcinolone cream (KENALOG) 0.1 % Apply 1 application topically 2 (two) times daily. 45 g 2   venlafaxine (EFFEXOR) 50 MG tablet Take 1 tablet (50 mg total) by mouth 2 (two) times daily.  180 tablet 3  Vibegron (GEMTESA) 75 MG TABS Take 1 capsule by mouth daily. 30 tablet 11   VISINE 0.05 % ophthalmic solution 1 drop  as needed     vitamin B-12 (CYANOCOBALAMIN) 100 MCG tablet Take 100 mcg by mouth daily.     VITAMIN D PO Take by mouth.     albuterol (PROVENTIL) (2.5 MG/3ML) 0.083% nebulizer solution Take 3 mLs (2.5 mg total) by nebulization every 6 (six) hours as needed for wheezing or shortness of breath. (Patient not taking: Reported on 10/28/2021) 360 mL 5   diphenoxylate-atropine (LOMOTIL) 2.5-0.025 MG tablet Take 1 tablet by mouth 3 (three) times daily as needed.     azithromycin (ZITHROMAX) 250 MG tablet Take 2 tablets (500 mg total) by mouth daily for 1 day, THEN 1 tablet (250 mg total) daily for 4 days. (Patient not taking: Reported on 10/28/2021) 6 tablet 0   No facility-administered medications prior to visit.    Allergies  Allergen Reactions   Losartan Other (See Comments)    hyperkalemia   Penicillins Hives, Swelling and Rash    Has patient had a PCN reaction causing immediate rash, facial/tongue/throat swelling, SOB or lightheadedness with hypotension: YES Has patient had a PCN reaction causing severe rash involving mucus membranes or skin necrosis: NO Has patient had a PCN reaction that required hospitalization NO Has patient had a PCN reaction occurring within the last 10 years: NO If all of the above answers are "NO", then may proceed with Cephalosporin use.    Cefixime [Kdc:Cefixime] Other (See Comments)    unspecified   Gabapentin Other (See Comments)    Feels woozy   Indocin [Indomethacin] Other (See Comments)    Painful tongue and throat   Singulair [Montelukast Sodium]     Chest tightness, decreased mental clarity   Sulfa Antibiotics Other (See Comments)    unspecified   Ace Inhibitors Other (See Comments)    cough   Statins Other (See Comments)    Myalgias    ROS As per HPI  PE:    10/28/2021    2:45 PM 10/25/2021   10:44 PM 10/25/2021    10:42 PM  Vitals with BMI  Height '5\' 0"'  '5\' 0"'    Weight  120 lbs   BMI  46.80   Systolic 321  224  Diastolic 58  80  Pulse 72  67     Physical Exam  General: Depressed affect, tearful at times.  Walks slowly, appears very tired but is in no distress. CV: RRR, no m/r/g.   LUNGS: CTA bilat, nonlabored resps, good aeration in all lung fields. Abdomen is soft and nontender and nondistended. Rectal exam: Moderate erythematous rash with well-demarcated borders consistent with intertrigo.  No tenderness on exam.  No perianal nodule or induration.  No fistula. No maceration.  No palpable external hemorrhoids.  No digital exam was done.  LABS:  Last CBC Lab Results  Component Value Date   WBC 6.9 06/07/2021   HGB 13.0 06/07/2021   HCT 40.0 06/07/2021   MCV 91.5 06/07/2021   MCH 29.7 06/07/2021   RDW 14.0 06/07/2021   PLT 254 82/50/0370   Last metabolic panel Lab Results  Component Value Date   GLUCOSE 83 07/03/2021   NA 142 07/03/2021   K 3.8 07/03/2021   CL 106 07/03/2021   CO2 25 07/03/2021   BUN 16 07/03/2021   CREATININE 0.98 07/03/2021   GFRNONAA 46 (L) 06/07/2021   CALCIUM 9.5 07/03/2021   PHOS 3.8 01/31/2013  PROT 6.6 06/28/2021   ALBUMIN 4.5 05/09/2020   BILITOT 0.6 06/28/2021   ALKPHOS 105 05/09/2020   AST 16 06/28/2021   ALT 13 06/28/2021   ANIONGAP 12 06/07/2021   IMPRESSION AND PLAN:  #1 major depressive disorder with intermittent mild cognitive impairment. Unclear if taking venlafaxine correctly lately.  She has been taking Valium a little bit more than her usual--sounds like 1-2 times a day, half pill at a time. First thing we need to clear up is whether her venlafaxine pill bottle can be found and see if we can get any further clarity on whether she has been taking it or not. Her boyfriend is with her in the room today and will try to help figure this out. We will call her tomorrow.  I will see her back in the office in 2 days. Check CBC and c-Met  today.  #2 possible acute UTI, History of recurrent UTI.  Abnormal urinalysis today: 1+ blood, 2+ leukocytes, otherwise normal. We will send for culture and sensitivity and start Cipro 500 twice daily x3 days.  #3 history of relatively recent COVID infection.  Resolved.  #4 rectal pain.  Suspect this is due to her intertrigo.  Continue Desitin cream.  Spent 55 min with Priscilla Cantrell today reviewing HPI, recent urgent care notes, reviewing relevant past history, doing exam, reviewing and discussing lab and imaging data, interviewing her significant other for clarification, monitoring vitals and observing for acute changes, and formulating plans.  An After Visit Summary was printed and given to the patient.  FOLLOW UP: Return in about 2 days (around 10/30/2021) for f/u depression and uti.  Signed:  Crissie Sickles, MD           10/29/2021

## 2021-10-28 NOTE — Telephone Encounter (Signed)
ATC patient. Patient could not hear on the other line. ATC back and busy signal. Medications sent to [facility]. Nothing further needed

## 2021-10-28 NOTE — Chronic Care Management (AMB) (Signed)
  Care Coordination   Note   10/28/2021 Name: Priscilla Cantrell MRN: [id number] DOB: Dec 07, 1935  Priscilla Cantrell is a 86 y.o. year old female who sees McGowen, Adrian Blackwater, MD for primary care. I reached out to Sherald Barge by phone today to offer care coordination services.  Ms. Fritcher was given information about Care Coordination services today including:   The Care Coordination services include support from the care team which includes your Nurse Coordinator, Clinical Social Worker, or Pharmacist.  The Care Coordination team is here to help remove barriers to the health concerns and goals most important to you. Care Coordination services are voluntary, and the patient may decline or stop services at any time by request to their care team member.   Care Coordination Consent Status: Patient agreed to services and verbal consent obtained.   Follow up plan:  Telephone appointment with care coordination team member scheduled for:  10/30/2021  Encounter Outcome:  Pt. Scheduled  Noreene Larsson, [place], [place] [address] Direct Dial: [phone] [email or web address]

## 2021-10-29 ENCOUNTER — Emergency Department (HOSPITAL_BASED_OUTPATIENT_CLINIC_OR_DEPARTMENT_OTHER): Payer: Medicare Other

## 2021-10-29 ENCOUNTER — Emergency Department (HOSPITAL_BASED_OUTPATIENT_CLINIC_OR_DEPARTMENT_OTHER)
Admission: EM | Admit: 2021-10-29 | Discharge: 2021-10-29 | Disposition: A | Discharge status: Home / Self Care | Payer: Medicare Other | Attending: Emergency Medicine | Admitting: Emergency Medicine

## 2021-10-29 ENCOUNTER — Other Ambulatory Visit: Payer: Self-pay

## 2021-10-29 ENCOUNTER — Encounter (HOSPITAL_BASED_OUTPATIENT_CLINIC_OR_DEPARTMENT_OTHER): Payer: Self-pay | Admitting: Emergency Medicine

## 2021-10-29 ENCOUNTER — Telehealth: Payer: Self-pay

## 2021-10-29 DIAGNOSIS — S2231XA Fracture of one rib, right side, initial encounter for closed fracture: Secondary | ICD-10-CM | POA: Diagnosis not present

## 2021-10-29 DIAGNOSIS — N3000 Acute cystitis without hematuria: Secondary | ICD-10-CM | POA: Diagnosis not present

## 2021-10-29 DIAGNOSIS — E039 Hypothyroidism, unspecified: Secondary | ICD-10-CM | POA: Diagnosis not present

## 2021-10-29 DIAGNOSIS — D72829 Elevated white blood cell count, unspecified: Secondary | ICD-10-CM | POA: Diagnosis not present

## 2021-10-29 DIAGNOSIS — I10 Essential (primary) hypertension: Secondary | ICD-10-CM | POA: Insufficient documentation

## 2021-10-29 DIAGNOSIS — R911 Solitary pulmonary nodule: Secondary | ICD-10-CM | POA: Diagnosis not present

## 2021-10-29 DIAGNOSIS — J449 Chronic obstructive pulmonary disease, unspecified: Secondary | ICD-10-CM | POA: Diagnosis not present

## 2021-10-29 DIAGNOSIS — R4182 Altered mental status, unspecified: Secondary | ICD-10-CM | POA: Diagnosis not present

## 2021-10-29 LAB — CBC WITH DIFFERENTIAL/PLATELET
Abs Immature Granulocytes: 0.65 10*3/uL — ABNORMAL HIGH (ref 0.00–0.07)
Basophils Absolute: 0.1 10*3/uL (ref 0.0–0.1)
Basophils Absolute: 0.1 10*3/uL (ref 0.0–0.1)
Basophils Relative: 0.3 % (ref 0.0–3.0)
Basophils Relative: 0 %
Eosinophils Absolute: 0.1 10*3/uL (ref 0.0–0.7)
Eosinophils Absolute: 0.2 10*3/uL (ref 0.0–0.5)
Eosinophils Relative: 0.6 % (ref 0.0–5.0)
Eosinophils Relative: 1 %
HCT: 42.8 % (ref 36.0–46.0)
HCT: 40.1 % (ref 36.0–46.0)
Hemoglobin: 14 g/dL (ref 12.0–15.0)
Hemoglobin: 13.2 g/dL (ref 12.0–15.0)
Immature Granulocytes: 3 %
Lymphocytes Relative: 4.5 % — ABNORMAL LOW (ref 12.0–46.0)
Lymphocytes Relative: 5 %
Lymphs Abs: 1 10*3/uL (ref 0.7–4.0)
Lymphs Abs: 1.1 10*3/uL (ref 0.7–4.0)
MCH: 29.1 pg (ref 26.0–34.0)
MCHC: 32.6 g/dL (ref 30.0–36.0)
MCHC: 32.9 g/dL (ref 30.0–36.0)
MCV: 89.6 fl (ref 78.0–100.0)
MCV: 88.3 fL (ref 80.0–100.0)
Monocytes Absolute: 1.2 10*3/uL — ABNORMAL HIGH (ref 0.1–1.0)
Monocytes Absolute: 1.3 10*3/uL — ABNORMAL HIGH (ref 0.1–1.0)
Monocytes Relative: 5.4 % (ref 3.0–12.0)
Monocytes Relative: 6 %
Neutro Abs: 20.1 10*3/uL — ABNORMAL HIGH (ref 1.4–7.7)
Neutro Abs: 17.9 10*3/uL — ABNORMAL HIGH (ref 1.7–7.7)
Neutrophils Relative %: 89.2 % — ABNORMAL HIGH (ref 43.0–77.0)
Neutrophils Relative %: 85 %
Platelets: 260 10*3/uL (ref 150.0–400.0)
Platelets: 222 10*3/uL (ref 150–400)
RBC: 4.78 Mil/uL (ref 3.87–5.11)
RBC: 4.54 MIL/uL (ref 3.87–5.11)
RDW: 15.6 % — ABNORMAL HIGH (ref 11.5–15.5)
RDW: 14.7 % (ref 11.5–15.5)
WBC: 22.5 10*3/uL (ref 4.0–10.5)
WBC: 21.2 10*3/uL — ABNORMAL HIGH (ref 4.0–10.5)
nRBC: 0 % (ref 0.0–0.2)

## 2021-10-29 LAB — COMPREHENSIVE METABOLIC PANEL
ALT: 51 U/L — ABNORMAL HIGH (ref 0–35)
ALT: 42 U/L (ref 0–44)
AST: 16 U/L (ref 0–37)
AST: 18 U/L (ref 15–41)
Albumin: 3.7 g/dL (ref 3.5–5.2)
Albumin: 3.1 g/dL — ABNORMAL LOW (ref 3.5–5.0)
Alkaline Phosphatase: 68 U/L (ref 39–117)
Alkaline Phosphatase: 58 U/L (ref 38–126)
Anion gap: 8 (ref 5–15)
BUN: 29 mg/dL — ABNORMAL HIGH (ref 6–23)
BUN: 23 mg/dL (ref 8–23)
CO2: 28 mEq/L (ref 19–32)
CO2: 25 mmol/L (ref 22–32)
Calcium: 9.3 mg/dL (ref 8.4–10.5)
Calcium: 9 mg/dL (ref 8.9–10.3)
Chloride: 99 mEq/L (ref 96–112)
Chloride: 103 mmol/L (ref 98–111)
Creatinine, Ser: 1.06 mg/dL (ref 0.40–1.20)
Creatinine, Ser: 1 mg/dL (ref 0.44–1.00)
GFR, Estimated: 56 mL/min — ABNORMAL LOW (ref >60)
GFR: 48.25 mL/min — ABNORMAL LOW (ref >60.00)
Glucose, Bld: 139 mg/dL — ABNORMAL HIGH (ref 70–99)
Glucose, Bld: 95 mg/dL (ref 70–99)
Potassium: 3.8 mEq/L (ref 3.5–5.1)
Potassium: 3.8 mmol/L (ref 3.5–5.1)
Sodium: 137 mEq/L (ref 135–145)
Sodium: 136 mmol/L (ref 135–145)
Total Bilirubin: 0.5 mg/dL (ref 0.2–1.2)
Total Bilirubin: 0.4 mg/dL (ref 0.3–1.2)
Total Protein: 6.2 g/dL (ref 6.0–8.3)
Total Protein: 6.1 g/dL — ABNORMAL LOW (ref 6.5–8.1)

## 2021-10-29 LAB — URINALYSIS, MICROSCOPIC (REFLEX)

## 2021-10-29 LAB — URINALYSIS, ROUTINE W REFLEX MICROSCOPIC
Bilirubin Urine: NEGATIVE
Glucose, UA: NEGATIVE mg/dL
Ketones, ur: NEGATIVE mg/dL
Nitrite: NEGATIVE
Protein, ur: NEGATIVE mg/dL
Specific Gravity, Urine: >=1.03 (ref 1.005–1.030)
pH: 5.5 (ref 5.0–8.0)

## 2021-10-29 LAB — LACTIC ACID, PLASMA: Lactic Acid, Venous: 1.4 mmol/L (ref 0.5–1.9)

## 2021-10-29 MED ORDER — NITROFURANTOIN MONOHYD MACRO 100 MG PO CAPS: 100.0000 mg | ORAL_CAPSULE | Freq: Two times a day (BID) | ORAL | 0 refills | Status: AC

## 2021-10-29 MED ORDER — NITROFURANTOIN MONOHYD MACRO 100 MG PO CAPS
100.0000 mg | ORAL_CAPSULE | Freq: Once | ORAL | Status: AC
  Administered 2021-10-29: 100 mg via ORAL
  Filled 2021-10-29: qty 1

## 2021-10-29 MED ORDER — SODIUM CHLORIDE 0.9 % IV BOLUS
500.0000 mL | Freq: Once | INTRAVENOUS | Status: AC
  Administered 2021-10-29: 500 mL via INTRAVENOUS

## 2021-10-29 NOTE — ED Provider Notes (Signed)
Emergency Department Provider Note   I have reviewed the triage vital signs and the nursing notes.   HISTORY  Chief Complaint Altered Mental Status   HPI Priscilla Cantrell is a 86 y.o. female presents to the ED with her son and concern for AMS yesterday. Son describes her as agitated, generally weak, and confused. She recalls this and did see her PCP who ordered labs. She returned home and found that she may have not been taking her medications properly. She has since returned to her typical meds and has felt much better today. No AMS today at all per son and patient. PCP called with labs describing leukocytosis and advised ED evaluation. No fever, chills. HA, or other symptoms or infection.    Past Medical History:  Diagnosis Date   Allergic rhinitis    with upper airway cough: as of 07/2017 pulm f/u she was instructed to use astelin, flonase, and saline more consistently   BPPV (benign paroxysmal positional vertigo) 03/2018   C. difficile colitis    Chest pain, non-cardiac    Cardiac CT showed no signif obstructive dz, mildly elevated calcium level (Dr. [name]).   Chronic cystitis with hematuria    [name]   Chronic hypoxemic respiratory failure ([place]) 02/02/2015   2L oxygen 24/7   Chronic renal insufficiency, stage 2 (mild)    GFR approx 60   Cold hands    NCS/EMGs normal.  Hand dysesthesias per neuro--reassured   COPD (chronic obstructive pulmonary disease) ([place])    GOLD 4.  spirometry 01/10/09 FEV 0.97(52%), FEV1% 47.  With chronic bronchitis as of 07/2017.   DDD (degenerative disc disease), cervical    1998 MRI C5-6 impingemt (left).  MRI 2021 (neurol) 1 level canal stenosis, 1 level foraminal stenosis--->PT   DDD (degenerative disc disease), lumbar    Depression with anxiety 04/15/2011   Diarrheal disease Summer 2017   ? 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 Dr  Earlean Shawl).  As of 10/05/15 pt reports GI dx'd her with C diff and rx'd more flagyl and she is also on cholestyramine.  As of 02/2016, pt's sx's resolved completely.   Diverticulosis of colon    Procto '97 and colonoscopy 2002   Dysplastic nevus of upper extremity 04/2014   R tricep ([name])   Edema of both lower extremities due to peripheral venous insufficiency    Varicosities bilat, swelling L>R.  R baker's cyst.  Got vein clinic eval 02/2018->sclerotherapy recommended but since no hemorrhage or ulceration, insurance will not cover the procedure.   Family history of adverse reaction to anesthesia    mother died when patient age 31 receiving anesthesia for thyroid surgery   Fibrocystic breast disease    w/fibroadenoma.  Bx's showed NO atypia (Dr. Margot Chimes).  Mammo neg 2008.   GERD (gastroesophageal reflux disease)    History of double vision    Ophthalmologist, Dr. Herbert Deaner, is further evaluating this with MRI  orbits and limited brain (myesthenia gravis testing neg 07/2010)   History of home oxygen therapy    uses 2 liters at hs   Hypertension    EKG 03/2010 normal   Hypothyroidism    Hashimoto's, dx'd 1989   Idiopathic peripheral neuropathy 04/2018   Sensory (numb feet) S1 distribution bilat, c/w spinal stenosis sensory neuropathy (no pain). 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  Labs [place], MRI C spine->some spondylosis/stenosis.  UE NCS/EMS:  normal 04/2019.   Lesion of right native kidney 2017   found on CT 02/2015.  F/u MRI 03/31/15 showed it to be smaller and less likely of any significance but repeat CT renal protocol in 30mowas recommended by radiology.  This was done 10/26/15 and showed no interval change in the lesion, but b/c the lesion enhances with contrast, radiol rec'd urol consult (b/c pap renal RCC could not be excluded).  Saw urol 03/06/16--stable on u/s 09/2016 and 09/2017.   OAB (overactive bladder)    Osteoarthritis,  hip, bilateral    she is s/p bilat THA as of 02/2018.   Osteoporosis    radius, T score -2.5->rec'd alendronate 08/2019->plan rpt DEXA 08/2021.   Pneumonia    Postmenopausal atrophic vaginitis    improved with estrace 2 X/week.   Prolapse of vaginal cuff after hysterectomy    10/2018: Dr. FWayne Both Mild colpocele 04/2019 per GYN (no surgery)   Pulmonary nodule    CT chest 12/10, June 2011.  No nodule on CT 06/2010.   Recurrent Clostridium difficile diarrhea    Episode 09/19/2016: resolved with prolonged course of flagyl.  11/2016 recurrence treated with 10d of Dificid (Fidaxomicin) by GI (Dr. MEarlean Shawl.   Recurrent UTI    likely related to pt's atrophic vaginitis.  Urol started vaginal estrace 03/2016.   Rib fracture 09/2018   R 7th, laterally-->sustained when her dog pulled her over and she fell.   RLS (restless legs syndrome)    neuro->gabapentin trial 2020/21   Shingles 02/2014   R abd/flank   Spinal stenosis of lumbar region with neurogenic claudication    2008 MRI (L3-4, L4-5, L5-S1).  +S1 distribution sensory loss bilat    Review of Systems  Constitutional: No fever/chills Eyes: No visual changes. ENT: No sore throat. Cardiovascular: Denies chest pain. Respiratory: Denies shortness of breath. Gastrointestinal: No abdominal pain.  No nausea, no vomiting.  No diarrhea.  No constipation. Genitourinary: Negative for dysuria. Musculoskeletal: Negative for back pain. Skin: Negative for rash. Neurological: Negative for headaches, focal weakness or numbness. AMS yesterday (resolved).   ____________________________________________   PHYSICAL EXAM:  VITAL SIGNS: ED Triage Vitals  Enc Vitals Group     BP 10/29/21 1744 134/71     Pulse Rate 10/29/21 1744 68     Resp 10/29/21 1744 20     Temp 10/29/21 1744 97.9 F (36.6 C)     Temp Source 10/29/21 1744 Oral     SpO2 10/29/21 1744 96 %     Weight 10/29/21 1742 115 lb (52.2 kg)     Height 10/29/21 1742 5' (1.524 m)    Constitutional: Alert and oriented x 4. Well appearing and in no acute distress. Eyes: Conjunctivae are normal.  Head: Atraumatic. Nose: No congestion/rhinnorhea. Mouth/Throat: Mucous membranes are moist.  Oropharynx non-erythematous. Neck: No stridor.   Cardiovascular: Normal rate, regular rhythm. Good peripheral circulation. Grossly normal heart sounds.   Respiratory: Normal respiratory effort.  No retractions. Lungs CTAB. Gastrointestinal: Soft and nontender. No distention.  Musculoskeletal: No lower extremity tenderness nor edema. No gross deformities of extremities. Neurologic:  Normal speech and language. No gross focal neurologic deficits are appreciated.   ____________________________________________   LABS (all labs ordered are listed, but only abnormal results are displayed)  Labs Reviewed  CBC WITH DIFFERENTIAL/PLATELET - Abnormal; Notable for the following components:      Result Value   WBC 21.2 (*)    Neutro Abs 17.9 (*)    Monocytes Absolute 1.3 (*)  Abs Immature Granulocytes 0.65 (*)    All other components within normal limits  COMPREHENSIVE METABOLIC PANEL - Abnormal; Notable for the following components:   Total Protein 6.1 (*)    Albumin 3.1 (*)    GFR, Estimated 56 (*)    All other components within normal limits  URINALYSIS, ROUTINE W REFLEX MICROSCOPIC - Abnormal; Notable for the following components:   Hgb urine dipstick SMALL (*)    Leukocytes,Ua SMALL (*)    All other components within normal limits  URINALYSIS, MICROSCOPIC (REFLEX) - Abnormal; Notable for the following components:   Bacteria, UA FEW (*)    All other components within normal limits  CULTURE, BLOOD (ROUTINE X 2)  LACTIC ACID, PLASMA   _____________________________  RADIOLOGY  DG Chest Port 1 View  Result Date: 10/29/2021 CLINICAL DATA:  Altered mental status EXAM: PORTABLE CHEST 1 VIEW COMPARISON:  Chest 06/07/2021 FINDINGS: Heart size upper normal. Negative for heart  failure. Negative for pneumonia or effusion Chronic right rib fractures unchanged. Fracture ninth rib posteriorly with callus formation simulating a lung nodule. Degenerative change left shoulder IMPRESSION: No acute cardiopulmonary abnormality.  Chronic right rib fractures. Electronically Signed   By: Franchot Gallo M.D.   On: 10/29/2021 20:00    ____________________________________________   PROCEDURES  Procedure(s) performed:   Procedures  None ____________________________________________   INITIAL IMPRESSION / ASSESSMENT AND PLAN / ED COURSE  Pertinent labs & imaging results that were available during my care of the patient were reviewed by me and considered in my medical decision making (see chart for details).   This patient is Presenting for Evaluation of AMS, which does require a range of treatment options, and is a complaint that involves a high risk of morbidity and mortality.  The Differential Diagnoses  includes but is not exclusive to alcohol, illicit or prescription medications, intracranial pathology such as stroke, intracerebral hemorrhage, fever or infectious causes including sepsis, hypoxemia, uremia, trauma, endocrine related disorders such as diabetes, hypoglycemia, thyroid-related diseases, etc.   Critical Interventions-    Medications  sodium chloride 0.9 % bolus 500 mL (0 mLs Intravenous Stopped 10/29/21 2136)  nitrofurantoin (macrocrystal-monohydrate) (MACROBID) capsule 100 mg (100 mg Oral Given 10/29/21 2211)    Reassessment after intervention: Remains fully alert and without fever.    I did obtain Additional Historical Information from son at bedside.  I decided to review pertinent External Data, and in summary no recent visits for the same.   Clinical Laboratory Tests Ordered, included leukocytosis noted without anemia. Normal lactic acid. UA with small leukocytosis and few bacteria. Negative nitrite. No AKI. Blood and urine cultures sent.   Radiologic  Tests Ordered, included CT head and CXR. I independently interpreted the images and agree with radiology interpretation.   Cardiac Monitor Tracing which shows NSR.    Social Determinants of Health Risk no active smoking.    Medical Decision Making: Summary:  Patient with AMS yesterday although cleared. No fever but continues to have leukocytosis here. No fever or infection symptoms. Sent cultures but presentation, vitals, and exam not consistent with sepsis. On reassessment, patient remains well appearing. Considered admit but patient has PCP follow up in the AM and is back to baseline. Will cover for possible UTI but no cleat symptoms. Will use macrobid with normal kidney function and no pyelonephritis symptoms.   Disposition: discharge  ____________________________________________  FINAL CLINICAL IMPRESSION(S) / ED DIAGNOSES  Final diagnoses:  Acute cystitis without hematuria  Leukocytosis, unspecified type     NEW  OUTPATIENT MEDICATIONS STARTED DURING THIS VISIT:  Discharge Medication List as of 10/29/2021 10:07 PM     START taking these medications   Details  nitrofurantoin, macrocrystal-monohydrate, (MACROBID) 100 MG capsule Take 1 capsule (100 mg total) by mouth 2 (two) times daily for 7 days., Starting Tue 10/29/2021, Until Tue 11/05/2021, Normal        Note:  This document was prepared using Dragon voice recognition software and may include unintentional dictation errors.  Nanda Quinton, MD, [facility] Emergency Medicine    Fernado Brigante, [facility], MD 11/04/21 [phone]

## 2021-10-29 NOTE — Telephone Encounter (Signed)
CRITICAL VALUE STICKER  CRITICAL VALUE: WBC 22.5  RECEIVER (on-site recipient of call): Lorriane Shire  DATE & TIME NOTIFIED: 1  MESSENGER (representative from lab):Karen  MD NOTIFIED: Raoul Pitch  TIME OF NOTIFICATION:1615  RESPONSE:

## 2021-10-29 NOTE — Telephone Encounter (Signed)
Spoke with patient regarding results/recommendations. Pt states she has started to feel nauseated today. Advised to go to ED

## 2021-10-29 NOTE — ED Notes (Signed)
Patient transported to CT 

## 2021-10-29 NOTE — Telephone Encounter (Signed)
Received critical lab on patient. WBC greater than 22.  Patient was seen in the office yesterday by Dr. Anitra Lauth with suspected UTI and confusion. Her blood pressure was lower than her normal which places concerns for patient being septic.   Please call patient immediately. I have concerned she is becoming septic and may need to be hospitalized for IV antibiotics.  If her symptoms are worse or the same as yesterday, I would like her to go to the ED for further evaluation.  If she states she is improved after starting the Cipro, then I encouraged her to follow-up with her PCP within 1-2 days.

## 2021-10-29 NOTE — Discharge Instructions (Signed)
You were seen in the emergency room today with elevated white blood cell count.  You are no longer confused and your urine may show some mild infection.  I am treating you with antibiotic for this and we are sending culture.  If you have return of your confusion, develop fever, shaking chills, pain, you should return immediately to the emergency department for evaluation.

## 2021-10-29 NOTE — ED Triage Notes (Addendum)
Pt/friend sts pt has been confused lately (pt poor historian); went to PCP yesterday; they called her today and told her she was "septic"; pt denies pain or other sxs

## 2021-10-30 ENCOUNTER — Encounter: Payer: Self-pay | Admitting: *Deleted

## 2021-10-30 ENCOUNTER — Encounter: Payer: Self-pay | Admitting: Family Medicine

## 2021-10-30 ENCOUNTER — Ambulatory Visit (INDEPENDENT_AMBULATORY_CARE_PROVIDER_SITE_OTHER): Payer: Medicare Other | Admitting: Family Medicine

## 2021-10-30 VITALS — BP 115/72 | HR 70 | Temp 98.4°F | Ht 60.0 in | Wt 120.2 lb

## 2021-10-30 DIAGNOSIS — R4189 Other symptoms and signs involving cognitive functions and awareness: Secondary | ICD-10-CM | POA: Diagnosis not present

## 2021-10-30 DIAGNOSIS — Z23 Encounter for immunization: Secondary | ICD-10-CM

## 2021-10-30 DIAGNOSIS — R059 Cough, unspecified: Secondary | ICD-10-CM

## 2021-10-30 DIAGNOSIS — D72829 Elevated white blood cell count, unspecified: Secondary | ICD-10-CM | POA: Diagnosis not present

## 2021-10-30 DIAGNOSIS — N3000 Acute cystitis without hematuria: Secondary | ICD-10-CM | POA: Diagnosis not present

## 2021-10-30 NOTE — Patient Instructions (Addendum)
Take nitrofurantoin (emergency doctor prescribed it yesterday) only once a day.  Take the ciprofloxacin twice a day until all gone.

## 2021-10-30 NOTE — Progress Notes (Signed)
OFFICE VISIT  10/30/2021  CC:  Chief Complaint  Patient presents with   Follow-up    Depression, UTI   Patient is a 86 y.o. female who presents accompanied by her female friend [name] for 2-day follow-up depression with cognitive impairment as well as UTI.  INTERIM HX: [name] white blood cell count 2 days ago returned yesterday and it was 22,000. When our office called her she had developed nausea.  She was directed to the emergency department for concern of possible sepsis.  Chest x-ray negative acute.  CT head negative acute.  Her white blood cell count was similar to what we got here on 10/28/2021, blood cultures were drawn, no results yet.  Urine culture results from 10/28/2021: not back yet  Update: Jocelyn Lamer says she is feeling much improved.  She has started eating well again.  No more periods of confusion. Medications found at home and she is straight on these now.  Specifically, she restarted her venlafaxine.  Her boyfriend agrees today that she is quite a bit better.  She notes the last couple of days having more nasal mucus, postnasal drip, rattly cough in the back of her throat. She states she has not felt like she has had wheezing or chest tightness or shortness of breath.  No fevers.  ROS as above, plus--> no CP, no dizziness, no HAs, no rashes, no melena/hematochezia.  No polyuria or polydipsia.  No myalgias or arthralgias.  No focal weakness, paresthesias, or tremors.  No acute vision or hearing abnormalities.  No dysuria or unusual/new urinary urgency or frequency.  No recent changes in lower legs. No n/v/d or abd pain.  No palpitations.    Past Medical History:  Diagnosis Date   Allergic rhinitis    with upper airway cough: as of 07/2017 pulm f/u she was instructed to use astelin, flonase, and saline more consistently   BPPV (benign paroxysmal positional vertigo) 03/2018   C. difficile colitis    Chest pain, non-cardiac    Cardiac CT showed no signif obstructive dz,  mildly elevated calcium level (Dr. [name]).   Chronic cystitis with hematuria    [name]   Chronic hypoxemic respiratory failure ([place]) 02/02/2015   2L oxygen 24/7   Chronic renal insufficiency, stage 2 (mild)    GFR approx 60   Cold hands    NCS/EMGs normal.  Hand dysesthesias per neuro--reassured   COPD (chronic obstructive pulmonary disease) ([place])    GOLD 4.  spirometry 01/10/09 FEV 0.97(52%), FEV1% 47.  With chronic bronchitis as of 07/2017.   DDD (degenerative disc disease), cervical    1998 MRI C5-6 impingemt (left).  MRI 2021 (neurol) 1 level canal stenosis, 1 level foraminal stenosis--->PT   DDD (degenerative disc disease), lumbar    Depression with anxiety 04/15/2011   Diarrheal disease Summer 2017   ? 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).  As of 10/05/15 pt reports GI dx'd her with C diff and rx'd more flagyl and she is also on cholestyramine.  As of 02/2016, pt's sx's resolved completely.   Diverticulosis of colon    Procto '97 and colonoscopy 2002   Dysplastic nevus of upper extremity 04/2014   R tricep ([name])   Edema of both lower extremities due to peripheral venous insufficiency    Varicosities bilat, swelling L>R.  R baker's cyst.  Got vein clinic eval 02/2018->sclerotherapy recommended but since no hemorrhage or ulceration, insurance  will not cover the procedure.   Family history of adverse reaction to anesthesia    mother died when patient age 32 receiving anesthesia for thyroid surgery   Fibrocystic breast disease    w/fibroadenoma.  Bx's showed NO atypia (Dr. Margot Chimes).  Mammo neg 2008.   GERD (gastroesophageal reflux disease)    History of double vision    Ophthalmologist, Dr. Herbert Deaner, is further evaluating this with MRI  orbits and limited brain (myesthenia gravis testing neg 07/2010)   History of home oxygen therapy    uses 2 liters at hs    Hypertension    EKG 03/2010 normal   Hypothyroidism    Hashimoto's, dx'd 1989   Idiopathic peripheral neuropathy 04/2018   Sensory (numb feet) S1 distribution bilat, c/w spinal stenosis sensory neuropathy (no pain). 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  Labs [place], MRI C spine->some spondylosis/stenosis.  UE NCS/EMS: normal 04/2019.   Lesion of right native kidney 2017   found on CT 02/2015.  F/u MRI 03/31/15 showed it to be smaller and less likely of any significance but repeat CT renal protocol in [place] recommended by radiology.  This was done 10/26/15 and showed no interval change in the lesion, but b/c the lesion enhances with contrast, radiol rec'd urol consult (b/c pap renal RCC could not be excluded).  Saw urol 03/06/16--stable on u/s 09/2016 and 09/2017.   OAB (overactive bladder)    Osteoarthritis, hip, bilateral    she is s/p bilat THA as of 02/2018.   Osteoporosis    radius, T score -2.5->rec'd alendronate 08/2019->plan rpt DEXA 08/2021.   Pneumonia    Postmenopausal atrophic vaginitis    improved with estrace 2 X/week.   Prolapse of vaginal cuff after hysterectomy    10/2018: Dr. FWayne Both Mild colpocele 04/2019 per GYN (no surgery)   Pulmonary nodule    CT chest 12/10, June 2011.  No nodule on CT 06/2010.   Recurrent Clostridium difficile diarrhea    Episode 09/19/2016: resolved with prolonged course of flagyl.  11/2016 recurrence treated with 10d of Dificid (Fidaxomicin) by GI (Dr. MEarlean Shawl.   Recurrent UTI    likely related to pt's atrophic vaginitis.  Urol started vaginal estrace 03/2016.   Rib fracture 09/2018   R 7th, laterally-->sustained when her dog pulled her over and she fell.   RLS (restless legs syndrome)    neuro->gabapentin trial 2020/21   Shingles 02/2014   R abd/flank   Spinal stenosis of lumbar region with neurogenic claudication    2008 MRI (L3-4, L4-5, L5-S1).  +S1 distribution sensory loss bilat     Past Surgical History:  Procedure Laterality Date   ABDOMINAL HYSTERECTOMY  1978   no BSO per pt--nonmalignant reasons   APPENDECTOMY     BREAST CYST EXCISION     Last screening mammogram 10/2010 was normal.   CHOLECYSTECTOMY  2001   COLONOSCOPY  04/2005; 12/06/2015   2007 NORMAL.  2017 adenomatous polyp x 1.  Mod sigmoid diverticulosis (Dr. MEarlean Shawl.     DEXA  05/18/2017; 08/7019   2019 NORMAL (T-score 0.1).  08/2019->T score -2.5->alendronate started->plan Rpt 08/2021.   HIP CLOSED REDUCTION Right 12/25/2015   Procedure: CLOSED REDUCTION HIP;  Surgeon: JNicholes Stairs MD;  Location: [facility];  Service: Orthopedics;  Laterality: Right;   NCV/EMS  10/2018   chronic and symmetric sensorimotor axonal polyneuropathy affecting the lower extremities   TONSILLECTOMY     TOTAL HIP ARTHROPLASTY Right 11/01/2014   Procedure:  RIGHT TOTAL HIP ARTHROPLASTY;  Surgeon: Latanya Maudlin, MD;  Location: [facility];  Service: Orthopedics;  Laterality: Right;   TOTAL HIP ARTHROPLASTY Left 12/08/2017   Procedure: LEFT TOTAL HIP ARTHROPLASTY ANTERIOR APPROACH;  Surgeon: Paralee Cancel, MD;  Location: [facility];  Service: Orthopedics;  Laterality: Left;  70 mins   TRANSTHORACIC ECHOCARDIOGRAM  08/2014   2016 EF 60-65%, grade I diast dysfxn. 06/21/21 unchanged.   TUBAL LIGATION  1974    Outpatient Medications Prior to Visit  Medication Sig Dispense Refill   albuterol (PROAIR HFA) 108 (90 Base) MCG/ACT inhaler Inhale 2 puffs into the lungs every 4 (four) hours as needed for wheezing or shortness of breath. 8.5 g 1   alendronate (FOSAMAX) 70 MG tablet TAKE 1 TABLET(70 MG) BY MOUTH 1 TIME A WEEK WITH A FULL GLASS OF WATER AND ON AN EMPTY STOMACH 12 tablet 3   Artificial Tear Solution (SOOTHE XP) SOLN Place 1 drop into both eyes 2 (two) times daily as needed (irritation).     aspirin EC 81 MG tablet Take 81 mg by mouth daily.     azelastine (ASTELIN) 0.1 % nasal spray Place 2 sprays into both nostrils 2 (two) times  daily. Use in each nostril as directed 30 mL 3   cetirizine (ZYRTEC) 10 MG tablet Take 1 tablet (10 mg total) by mouth daily. 30 tablet 6   ciprofloxacin (CILOXAN) 0.3 % ophthalmic solution Place 1 drop into the left eye every 4 (four) hours while awake. Administer 1 drop, every 2 hours, while awake, for 2 days. Then 1 drop, every 4 hours, while awake, for the next 5 days. 5 mL 0   ciprofloxacin (CIPRO) 500 MG tablet Take 1 tablet (500 mg total) by mouth 2 (two) times daily for 5 days. 6 tablet 0   clobetasol (TEMOVATE) 0.05 % external solution APPLY TO THE AFFECTED AREA EVERY DAY 50 mL 4   cycloSPORINE (RESTASIS OP) Apply to eye.     diazepam (VALIUM) 5 MG tablet TAKE 1 TABLET BY MOUTH DAILY AS NEEDED FOR ANXIETY AND INSOMNIA 30 tablet 5   diphenoxylate-atropine (LOMOTIL) 2.5-0.025 MG tablet Take 1 tablet by mouth 3 (three) times daily as needed.     fluticasone (CUTIVATE) 0.05 % cream Apply topically 2 (two) times daily. 30 g 1   fluticasone (FLONASE) 50 MCG/ACT nasal spray Place 2 sprays into both nostrils daily. 16 g 6   Fluticasone-Umeclidin-Vilant (TRELEGY ELLIPTA) 100-62.5-25 MCG/ACT AEPB Inhale 1 puff into the lungs daily. 180 each 3   furosemide (LASIX) 20 MG tablet 2 tabs qd alt with 1 tab qd 90 tablet 1   levothyroxine (SYNTHROID) 100 MCG tablet Take 1 tablet (100 mcg total) by mouth daily. 30 tablet 2   Lifitegrast (XIIDRA) 5 % SOLN Apply to eye in the morning and at bedtime.     nitrofurantoin (MACRODANTIN) 50 MG capsule TAKE 1 CAPSULE(50 MG) BY MOUTH AT BEDTIME 90 capsule 3   nitrofurantoin, macrocrystal-monohydrate, (MACROBID) 100 MG capsule Take 1 capsule (100 mg total) by mouth 2 (two) times daily for 7 days. 14 capsule 0   nystatin (MYCOSTATIN) 100000 UNIT/ML suspension Swish 5 mL in mouth for several minutes then gargle and swallow.  Do this 4 times daily for 7 to 14 days. 140 mL 1   nystatin (MYCOSTATIN) 100000 UNIT/ML suspension Take 5 mLs (500,000 Units total) by mouth 4 (four)  times daily for 5 days. 60 mL 0   Probiotic Product (PROBIOTIC DAILY PO) Take 1 capsule by  mouth daily.      roflumilast (DALIRESP) 500 MCG TABS tablet Take 1 tablet (500 mcg total) by mouth daily. 90 tablet 3   spironolactone (ALDACTONE) 25 MG tablet 1.5 tab po bid 270 tablet 0   triamcinolone cream (KENALOG) 0.1 % Apply 1 application topically 2 (two) times daily. 45 g 2   venlafaxine (EFFEXOR) 50 MG tablet Take 1 tablet (50 mg total) by mouth 2 (two) times daily. 180 tablet 3   Vibegron (GEMTESA) 75 MG TABS Take 1 capsule by mouth daily. 30 tablet 11   VISINE 0.05 % ophthalmic solution 1 drop  as needed     vitamin B-12 (CYANOCOBALAMIN) 100 MCG tablet Take 100 mcg by mouth daily.     VITAMIN D PO Take by mouth.     albuterol (PROVENTIL) (2.5 MG/3ML) 0.083% nebulizer solution Take 3 mLs (2.5 mg total) by nebulization every 6 (six) hours as needed for wheezing or shortness of breath. (Patient not taking: Reported on 10/28/2021) 360 mL 5   No facility-administered medications prior to visit.    Allergies  Allergen Reactions   Losartan Other (See Comments)    hyperkalemia   Penicillins Hives, Swelling and Rash    Has patient had a PCN reaction causing immediate rash, facial/tongue/throat swelling, SOB or lightheadedness with hypotension: YES Has patient had a PCN reaction causing severe rash involving mucus membranes or skin necrosis: NO Has patient had a PCN reaction that required hospitalization NO Has patient had a PCN reaction occurring within the last 10 years: NO If all of the above answers are "NO", then may proceed with Cephalosporin use.    Cefixime [Kdc:Cefixime] Other (See Comments)    unspecified   Gabapentin Other (See Comments)    Feels woozy   Indocin [Indomethacin] Other (See Comments)    Painful tongue and throat   Singulair [Montelukast Sodium]     Chest tightness, decreased mental clarity   Sulfa Antibiotics Other (See Comments)    unspecified   Ace Inhibitors  Other (See Comments)    cough   Statins Other (See Comments)    Myalgias     ROS As per HPI  PE:    10/30/2021    2:49 PM 10/29/2021   10:00 PM 10/29/2021    9:30 PM  Vitals with BMI  Height '5\' 0"'$     Weight 120 lbs 3 oz    BMI 02.54    Systolic 270 623 762  Diastolic 72 74 90  Pulse 70 57 60    Physical Exam  Gen: Alert, well appearing.  Patient is oriented to person, place, time, and situation.. AFFECT: pleasant, lucid thought and speech. CV: RRR, no m/r/g.   LUNGS: CTA bilat, nonlabored resps, good aeration in all lung fields. EXT: no clubbing or cyanosis.  no edema.    LABS:  Last CBC Lab Results  Component Value Date   WBC 21.2 (H) 10/29/2021   HGB 13.2 10/29/2021   HCT 40.1 10/29/2021   MCV 88.3 10/29/2021   MCH 29.1 10/29/2021   RDW 14.7 10/29/2021   PLT 222 83/15/1761   Last metabolic panel Lab Results  Component Value Date   GLUCOSE 95 10/29/2021   NA 136 10/29/2021   K 3.8 10/29/2021   CL 103 10/29/2021   CO2 25 10/29/2021   BUN 23 10/29/2021   CREATININE 1.00 10/29/2021   GFRNONAA 56 (L) 10/29/2021   CALCIUM 9.0 10/29/2021   PHOS 3.8 01/31/2013   PROT 6.1 (L) 10/29/2021  ALBUMIN 3.1 (L) 10/29/2021   BILITOT 0.4 10/29/2021   ALKPHOS 58 10/29/2021   AST 18 10/29/2021   ALT 42 10/29/2021   ANIONGAP 8 10/29/2021   IMPRESSION AND PLAN:  Confusion and leukocytosis-->UTI, possible early sepsis, and recent inadvertent nonadherence with her venlafaxine. Much improved. She will continue Cipro 500 twice daily for 1 week course. She will continue nitrofurantoin 100 mg a day that she takes chronically for recurrent UTI. Repeat CBC today. We will follow-up urine and blood culture results.  Regarding her upper respiratory symptoms, treat symptomatically with antihistamine. No sign of COPD exacerbation at this time.  An After Visit Summary was printed and given to the patient.  FOLLOW UP: Return in about 1 week (around 11/06/2021) for f/u  uti/copd.  Signed:  Crissie Sickles, MD           10/30/2021

## 2021-11-01 ENCOUNTER — Ambulatory Visit: Payer: Self-pay | Admitting: *Deleted

## 2021-11-01 LAB — URINALYSIS W MICROSCOPIC + REFLEX CULTURE
Bilirubin Urine: NEGATIVE
Glucose, UA: NEGATIVE
Ketones, ur: NEGATIVE
Nitrites, Initial: NEGATIVE
RBC / HPF: NONE SEEN /HPF (ref 0–2)
Specific Gravity, Urine: 1.021 (ref 1.001–1.035)
Yeast: NONE SEEN /HPF
pH: 5.5 (ref 5.0–8.0)

## 2021-11-01 LAB — CULTURE INDICATED

## 2021-11-01 LAB — URINE CULTURE
MICRO NUMBER:: 13968422
SPECIMEN QUALITY:: ADEQUATE

## 2021-11-01 NOTE — Patient Outreach (Signed)
  Care Coordination   Initial Visit Note   11/01/2021 Name: Priscilla Cantrell MRN: [id number] DOB: [birth year]  Priscilla Cantrell is a 86 y.o. year old female who sees McGowen, Adrian Blackwater, MD for primary care. I spoke with  Sherald Barge by phone today.  What matters to the patients health and wellness today?  na    Goals Addressed   None     SDOH assessments and interventions completed:  No     Care Coordination Interventions Activated:  No  Care Coordination Interventions:  No, not indicated   Follow up plan:  request a call at a later date to reschedule. Will request CG to contact pt at a later date to reschedule.    Encounter Outcome:  Pt. Request to Call Back   Raina Mina, RN Care Management Coordinator [place] Office [phone]

## 2021-11-03 LAB — CULTURE, BLOOD (ROUTINE X 2): Culture: NO GROWTH

## 2021-11-04 ENCOUNTER — Encounter: Payer: Self-pay | Admitting: Pulmonary Disease

## 2021-11-04 ENCOUNTER — Ambulatory Visit (INDEPENDENT_AMBULATORY_CARE_PROVIDER_SITE_OTHER): Payer: Medicare Other | Admitting: Pulmonary Disease

## 2021-11-04 VITALS — BP 122/60 | HR 68 | Temp 97.6°F | Ht 60.0 in | Wt 120.0 lb

## 2021-11-04 DIAGNOSIS — J439 Emphysema, unspecified: Secondary | ICD-10-CM | POA: Diagnosis not present

## 2021-11-04 DIAGNOSIS — Z8616 Personal history of COVID-19: Secondary | ICD-10-CM | POA: Diagnosis not present

## 2021-11-04 DIAGNOSIS — J9611 Chronic respiratory failure with hypoxia: Secondary | ICD-10-CM

## 2021-11-04 DIAGNOSIS — J441 Chronic obstructive pulmonary disease with (acute) exacerbation: Secondary | ICD-10-CM

## 2021-11-04 DIAGNOSIS — J4489 Other specified chronic obstructive pulmonary disease: Secondary | ICD-10-CM

## 2021-11-04 MED ORDER — PREDNISONE 10 MG PO TABS: ORAL_TABLET | ORAL | 0 refills | Status: AC

## 2021-11-04 NOTE — Progress Notes (Signed)
[facility]  Chief Complaint  Patient presents with   Acute Visit    SOB, cough, sinus drainage, chest congestion and runny nose for a month after pt tested positive for covid, beginning of September     Constitutional:  BP 122/60 (BP Location: Left Arm, Patient Position: Sitting)   Pulse 68   Temp 97.6 F (36.4 C) (Temporal)   Ht 5' (1.524 m)   Wt 120 lb (54.4 kg) Comment: reported  SpO2 92% Comment: ra  BMI 23.44 kg/m   Past Medical History:  Chronic cystitis, DDD, Anxiety, Depression, C diff 2017 and 2018, Diverticulosis, GERD, HTN, Hypothyroidism, PNA, Shingles, Spinal stenosis  Past Surgical History:  She  has a past surgical history that includes Cholecystectomy (2001); Tubal ligation (1974); Abdominal hysterectomy (1978); Breast cyst excision; Appendectomy; Tonsillectomy; Colonoscopy (04/2005; 12/06/2015); transthoracic echocardiogram (08/2014); Total hip arthroplasty (Right, 11/01/2014); Hip Closed Reduction (Right, 12/25/2015); DEXA (05/18/2017; 08/7019); Total hip arthroplasty (Left, 12/08/2017); and NCV/EMS (10/2018).  Brief Summary:  Priscilla Cantrell is a 86 y.o. female former smoker with GOLD 4 COPD, ACE cough, and nocturnal hypoxemia.       Subjective:   She is here with her boyfriend.  She was treated for COVID in September.  She was started on cipro by her PCP for a UTI.  Urine cultures grew Klebsiella and Proteus.  Chest xray from 10/29/21 showed chronic Rt rib fractures.    She has been feeling more confused and having trouble with her memory.  She is getting fatigued more quickly and feels more short of breath with activity.  Her chest feels tight and she has been getting some wheezing.  She has been using albuterol and this helps.  She isn't sure if she had a fever, but she has been feeling chilled.    SpO2 dropped to 87% while walking on room air today.  She was placed on 2 liters supplemental oxygen and recovered  to above 92%.  Physical Exam:   Appearance - well kempt   ENMT - no sinus tenderness, no oral exudate, no LAN, Mallampati 3 airway, no stridor, raspy voice  Respiratory - decreased breath sounds bilaterally, no wheezing or rales  CV - s1s2 regular rate and rhythm, no murmurs  Ext - no clubbing, no edema  Skin - no rashes  Psych - normal mood and affect      Pulmonary testing:  Spirometry 01/10/09>>FEV1 0.97(52%), FEV1% 47 March 2012>>Daliresp started RAST 05/04/17 >> negative, IgE 8 Spirometry 05/14/17 >> FEV1 0.91 (55%), FEV1% 48 PFT 10/29/18 >> FEV1 1.05 (68%), FEV1% 52, TLC 4.29 (96%), DLCO 44%  Sleep Tests:  PSG 11/14/11 >> AHI 0.3  ONO with RA 07/31/16 >> test time 6 hrs 41 min.  Average SpO2 87%, low SpO2 74%.  Spent 4 hrs 13 min with SpO2 < 88%. ONO with 2 liters 07/24/21 >> test time 3 hrs 58 min.  Baseline SpO2 97%, low SpO2 84%.  Spent 19 sec with SpO2 < 88%.  Cardiac Tests:  Echo 06/21/21 >> EF 60 to 65%, grade 1 DD, mild elevation in PASP, mild LA dilation  Social History:  She  reports that she quit smoking about 31 years ago. Her smoking use included cigarettes. She started smoking about 65 years ago. She has a 51.00 pack-year smoking history. She has never used smokeless tobacco. She reports current alcohol use. She reports that she does not use drugs.  Family History:  Her family history includes  Cancer in her maternal aunt and paternal grandfather; Cirrhosis in her father; Diabetes in her daughter; Goiter in her mother; Hernia in her paternal grandmother; Hypertension in her paternal grandmother; Psoriasis in her father; Stroke in her maternal grandfather and paternal grandmother.     Assessment/Plan:   GOLD 4 COPD with chronic bronchitis. - she has exacerbation after recent COVID infection - she is already on cipro for her UTI, so I don't think she needs additional antibiotics - will give her a taper course of prednisone over 6 days - continue trelegy and  daliresp - prn albuterol - she has a nebulizer  COVID 19 infection. - from September 2023 - explained how COVID can exacerbate control of her COPD, and can cause brain fog and increased fatigue  Allergic rhinitis with upper airway cough. - prn zyrtec, azelastine, flonase  Laryngopharyngeal reflux. - she is to follow up with GI   Chronic respiratory failure with hypoxia. - increased O2 needs likely combination of COPD progression and recent COVID infection - will arrange for portable oxygen set up at 2 liters to be used during the day - she is to continue 2 liters oxygen at night  - goal SpO2 > 90%  Leukocytosis. - likely related to recent UTI - explained how prednisone use can make her WBC remain elevated while on therapy and that she might need f/u CBC after she has completed prednisone therapy  Time Spent Involved in Patient Care on Day of Examination:  47 minutes  Follow up:   Patient Instructions  Prednisone 10 mg pill >> 3 pills daily for 2 days, 2 pills daily for 2 days, 1 pill daily for 2 days  Will arrange for daytime portable oxygen set up  Follow up in 6 weeks  Medication List:   Allergies as of 11/04/2021       Reactions   Losartan Other (See Comments)   hyperkalemia   Penicillins Hives, Swelling, Rash   Has patient had a PCN reaction causing immediate rash, facial/tongue/throat swelling, SOB or lightheadedness with hypotension: YES Has patient had a PCN reaction causing severe rash involving mucus membranes or skin necrosis: NO Has patient had a PCN reaction that required hospitalization NO Has patient had a PCN reaction occurring within the last 10 years: NO If all of the above answers are "NO", then may proceed with Cephalosporin use.   Cefixime [kdc:cefixime] Other (See Comments)   unspecified   Gabapentin Other (See Comments)   Feels woozy   Indocin [indomethacin] Other (See Comments)   Painful tongue and throat   Singulair [montelukast Sodium]     Chest tightness, decreased mental clarity   Sulfa Antibiotics Other (See Comments)   unspecified   Ace Inhibitors Other (See Comments)   cough   Statins Other (See Comments)   Myalgias        Medication List        Accurate as of November 04, 2021  2:48 PM. If you have any questions, ask your nurse or doctor.          albuterol 108 (90 Base) MCG/ACT inhaler Commonly known as: ProAir HFA Inhale 2 puffs into the lungs every 4 (four) hours as needed for wheezing or shortness of breath.   albuterol (2.5 MG/3ML) 0.083% nebulizer solution Commonly known as: PROVENTIL Take 3 mLs (2.5 mg total) by nebulization every 6 (six) hours as needed for wheezing or shortness of breath.   alendronate 70 MG tablet Commonly known as: FOSAMAX TAKE 1 TABLET(70  MG) BY MOUTH 1 TIME A WEEK WITH A FULL GLASS OF WATER AND ON AN EMPTY STOMACH   aspirin EC 81 MG tablet Take 81 mg by mouth daily.   azelastine 0.1 % nasal spray Commonly known as: ASTELIN Place 2 sprays into both nostrils 2 (two) times daily. Use in each nostril as directed   cetirizine 10 MG tablet Commonly known as: ZYRTEC Take 1 tablet (10 mg total) by mouth daily.   ciprofloxacin 0.3 % ophthalmic solution Commonly known as: Ciloxan Place 1 drop into the left eye every 4 (four) hours while awake. Administer 1 drop, every 2 hours, while awake, for 2 days. Then 1 drop, every 4 hours, while awake, for the next 5 days.   clobetasol 0.05 % external solution Commonly known as: TEMOVATE APPLY TO THE AFFECTED AREA EVERY DAY   diazepam 5 MG tablet Commonly known as: VALIUM TAKE 1 TABLET BY MOUTH DAILY AS NEEDED FOR ANXIETY AND INSOMNIA   diphenoxylate-atropine 2.5-0.025 MG tablet Commonly known as: LOMOTIL Take 1 tablet by mouth 3 (three) times daily as needed.   fluticasone 0.05 % cream Commonly known as: CUTIVATE Apply topically 2 (two) times daily.   fluticasone 50 MCG/ACT nasal spray Commonly known as: FLONASE Place 2  sprays into both nostrils daily.   furosemide 20 MG tablet Commonly known as: LASIX 2 tabs qd alt with 1 tab qd   Gemtesa 75 MG Tabs Generic drug: Vibegron Take 1 capsule by mouth daily.   levothyroxine 100 MCG tablet Commonly known as: SYNTHROID Take 1 tablet (100 mcg total) by mouth daily.   nitrofurantoin (macrocrystal-monohydrate) 100 MG capsule Commonly known as: MACROBID Take 1 capsule (100 mg total) by mouth 2 (two) times daily for 7 days.   nitrofurantoin 50 MG capsule Commonly known as: MACRODANTIN TAKE 1 CAPSULE(50 MG) BY MOUTH AT BEDTIME   nystatin 100000 UNIT/ML suspension Commonly known as: MYCOSTATIN Swish 5 mL in mouth for several minutes then gargle and swallow.  Do this 4 times daily for 7 to 14 days.   predniSONE 10 MG tablet Commonly known as: DELTASONE Take 3 tablets (30 mg total) by mouth daily with breakfast for 2 days, THEN 2 tablets (20 mg total) daily with breakfast for 2 days, THEN 1 tablet (10 mg total) daily with breakfast for 2 days. Start taking on: November 04, 2021 Started by: Chesley Mires, MD   PROBIOTIC DAILY PO Take 1 capsule by mouth daily.   RESTASIS OP Apply to eye.   roflumilast 500 MCG Tabs tablet Commonly known as: DALIRESP Take 1 tablet (500 mcg total) by mouth daily.   Soothe XP Soln Place 1 drop into both eyes 2 (two) times daily as needed (irritation).   spironolactone 25 MG tablet Commonly known as: ALDACTONE 1.5 tab po bid   Trelegy Ellipta 100-62.5-25 MCG/ACT Aepb Generic drug: Fluticasone-Umeclidin-Vilant Inhale 1 puff into the lungs daily.   triamcinolone cream 0.1 % Commonly known as: KENALOG Apply 1 application topically 2 (two) times daily.   venlafaxine 50 MG tablet Commonly known as: EFFEXOR Take 1 tablet (50 mg total) by mouth 2 (two) times daily.   Visine 0.05 % ophthalmic solution Generic drug: tetrahydrozoline 1 drop  as needed   vitamin B-12 100 MCG tablet Commonly known as: CYANOCOBALAMIN Take  100 mcg by mouth daily.   VITAMIN D PO Take by mouth.   Xiidra 5 % Soln Generic drug: Lifitegrast Apply to eye in the morning and at bedtime.  Signature:  Chesley Mires, MD [place] Pager - [phone] 11/04/2021, 2:48 PM

## 2021-11-04 NOTE — Patient Instructions (Addendum)
Prednisone 10 mg pill >> 3 pills daily for 2 days, 2 pills daily for 2 days, 1 pill daily for 2 days  Will arrange for daytime portable oxygen set up  Follow up in 6 weeks

## 2021-11-05 ENCOUNTER — Telehealth: Payer: Self-pay

## 2021-11-05 NOTE — Chronic Care Management (AMB) (Signed)
  Care Coordination Note  11/05/2021 Name: LILLYN WIECZOREK MRN: [id number] DOB: Apr 28, 1935  Anne Hahn Dandy is a 86 y.o. year old female who is a primary care patient of McGowen, Adrian Blackwater, MD and is actively engaged with the care management team. I reached out to Sherald Barge by phone today to assist with re-scheduling an initial visit with the RN Case Manager  Follow up plan: Unsuccessful telephone outreach attempt made. A HIPAA compliant phone message was left for the patient providing contact information and requesting a return call.  The care management team will reach out to the patient again over the next 7 days.  If patient returns call to provider office, please advise to call  [place]  at [place], [place] Management  [place], [place] [address] Direct Dial: [phone] [email or web address]

## 2021-11-06 ENCOUNTER — Ambulatory Visit: Payer: Medicare Other | Admitting: Family Medicine

## 2021-11-07 NOTE — Chronic Care Management (AMB) (Signed)
  Care Coordination Note  11/07/2021 Name: Priscilla Cantrell MRN: [id number] DOB: 1935/09/29  Priscilla Cantrell is a 86 y.o. year old female who is a primary care patient of McGowen, Adrian Blackwater, MD and is actively engaged with the care management team. I reached out to Sherald Barge by phone today to assist with re-scheduling an initial visit with the RN Case Manager  Follow up plan: Unsuccessful telephone outreach attempt made. A HIPAA compliant phone message was left for the patient providing contact information and requesting a return call.  The care management team will reach out to the patient again over the next 7 days.  If patient returns call to provider office, please advise to call [place]  at [place], [place] Management  [place], [place] [address] Direct Dial: [phone] [email or web address]

## 2021-11-08 ENCOUNTER — Telehealth: Payer: Self-pay | Admitting: Pulmonary Disease

## 2021-11-08 NOTE — Telephone Encounter (Signed)
Will route to [facility] to follow up on sending order.

## 2021-11-11 ENCOUNTER — Telehealth: Payer: Self-pay | Admitting: Pulmonary Disease

## 2021-11-11 NOTE — Telephone Encounter (Signed)
Order was sent to [employer] on 10/2 & confirmation received from [place] on that date.  I called Priscilla Cantrell and she had a note she was going to ask [place] if there was a walk.  I told her SATs are on Snap Shot screen. & pt was walked on 10/2.  She states she will take care of this today.  I spoke to pt and made her aware and told her to call me if she doesn't hear from them by later this afternoon.  Nothing further needed at this time.

## 2021-11-12 ENCOUNTER — Telehealth: Payer: Self-pay | Admitting: Pulmonary Disease

## 2021-11-12 NOTE — Telephone Encounter (Signed)
Requisition faxed to [facility] office fax to give to [name] in [facility] office for signature.

## 2021-11-12 NOTE — Telephone Encounter (Signed)
Pt calling back regarding oxygen.  Send oxygen out last night but is too heavy.  Pt is having issues with getting oxygen situated.  Please advise.

## 2021-11-12 NOTE — Telephone Encounter (Signed)
Called and spoke to patient. She states [employer] brought her Portable O2 tanks but they are too heavy for her to lift. She states she is stuck in her house until it gets straightened out. She is asking for smaller tanks but prefers an Occupational psychologist. I notified her that I would message the [employer] staff to see if we could get her a POC or smaller tanks that she can carry.She wants [employer] to call her with options for portable O2. Community message sent to [employer] staff and asked them to give her a call so she can get more information on what they offer. Nothing further needed at this time. Will addend encounter if anything further is needed form pulmonary.

## 2021-11-12 NOTE — Telephone Encounter (Signed)
Called and spoke with [employer]. She is faxing over a requisition for [name] to sign. Will fax to [facility] office once we receive it

## 2021-11-12 NOTE — Telephone Encounter (Signed)
We did not order homefill, we ordered portable o2    Type Date User Summary Attachment  Provider Comments 11/04/2021  2:48 PM Chesley Mires, MD Provider Comments -  Note:   Please have [employer] arrange for daytime portable oxygen set up at 2 liters.  She is already using 2 liters at night and is to continue this also.  LMTCB for [employer]

## 2021-11-13 ENCOUNTER — Encounter: Payer: Self-pay | Admitting: Family Medicine

## 2021-11-13 ENCOUNTER — Telehealth: Payer: Self-pay | Admitting: Pulmonary Disease

## 2021-11-13 DIAGNOSIS — J9611 Chronic respiratory failure with hypoxia: Secondary | ICD-10-CM

## 2021-11-13 DIAGNOSIS — J441 Chronic obstructive pulmonary disease with (acute) exacerbation: Secondary | ICD-10-CM

## 2021-11-13 NOTE — Telephone Encounter (Signed)
I am not in [address] office until 10/23.  Is it possible to have this sent to [facility] office when I am there in morning of 10/13?

## 2021-11-13 NOTE — Telephone Encounter (Signed)
Order was placed and received confirmation from [facility] on referral note.

## 2021-11-13 NOTE — Telephone Encounter (Signed)
Called and spoke with patient and she now has the machine she requested from [facility]. She needs nothing further for now.

## 2021-11-13 NOTE — Addendum Note (Signed)
Addended by: Fritzi Mandes D on: 11/13/2021 02:12 PM   Modules accepted: Orders

## 2021-11-13 NOTE — Telephone Encounter (Signed)
New order placed. Nothing further needed. 

## 2021-11-13 NOTE — Telephone Encounter (Signed)
Documents faxed to [facility] office.

## 2021-11-13 NOTE — Telephone Encounter (Signed)
Spoke with patient. She is very upset and angry with [employer]. She states that after our call yesterday someone called her and spoke with her. The person she talked to stated they would give her a call back and she did not hear back from them. The patient is very frustrated and feels like she is trapped in her home because she cannot carry the large portable O2 tanks that she was provided and wants the smaller tanks brought out to her. Sent another community message to [employer] staff asking them to call patient and to let me know if there was anything we needed to do on our end to provide patient with smaller tanks or an [employer] machine.

## 2021-11-13 NOTE — Telephone Encounter (Signed)
RE: O2 Received: Today New, Priscilla Cantrell, Priscilla Cantrell, [place]; Priscilla Cantrell would have the answers you are looking for. But, most of the time o2 orders are done same day.       ----- Message -----  From: Sharlot Gowda, CMA  Sent: 11/13/2021  10:44 AM EDT  To: Leory Plowman New  Subject: RE: O2                                         Awesome! Thank you so much! I will document this in her chart and will let her know you guys are working on it. Does the service call mean someone will be delivering today? Just want to make sure I'm relaying correctly to the patient since she is very anxious about the oxygen! Thank you!   ----- Message -----  From: Miquel Dunn  Sent: 11/13/2021  10:35 AM EDT  To: Darlina Guys; Miquel Dunn; Nash Shearer; *  Subject: RE: O2                                         Hello Zula Hovsepian,   I Pulled this and reviewed the notes and the orders. It looks like there is a Service call awaiting for today 11/13/21. there is a note regarding s/o to a smaller POC.   Thank you,   Leroy Sea New    ATC patient to let her know what was said from [place] with [employer]. Phone went to vm and vm is full.

## 2021-11-13 NOTE — Chronic Care Management (AMB) (Signed)
  Care Coordination Note  11/13/2021 Name: Priscilla Cantrell MRN: [id number] DOB: 27-Aug-1935  Priscilla Cantrell is a 86 y.o. year old female who is a primary care patient of McGowen, Adrian Blackwater, MD and is actively engaged with the care management team. I reached out to Sherald Barge by phone today to assist with re-scheduling a follow up visit with the RN Case Manager  Follow up plan: Telephone appointment with care management team member scheduled for:11/14/2021  Noreene Larsson, [place] Management  [place], [place] [address] Direct Dial: [phone] [email or web address]

## 2021-11-14 ENCOUNTER — Ambulatory Visit: Payer: Self-pay | Admitting: *Deleted

## 2021-11-14 NOTE — Patient Outreach (Signed)
  Care Coordination   11/14/2021 Name: Priscilla Cantrell MRN: [id number] DOB: 04/13/1935   Care Coordination Outreach Attempts:  An unsuccessful telephone outreach was attempted for a scheduled appointment today.  Follow Up Plan:  No further outreach attempts will be made at this time. We have been unable to contact the patient to offer or enroll patient in care coordination services  Encounter Outcome:  No Answer  Care Coordination Interventions Activated:  No   Care Coordination Interventions:  No, not indicated    Raina Mina, RN Care Management Coordinator [place] Office [phone]

## 2021-11-15 ENCOUNTER — Other Ambulatory Visit: Payer: Self-pay

## 2021-11-15 NOTE — Telephone Encounter (Signed)
This is a form to request labs for primary immunodeficiency as a home test.  I did not order this, and don't know how this was generated.  Can you please check with Joselynne if this is something she had requested, and if so then I can sign it.  Otherwise I will not sign this.

## 2021-11-15 NOTE — Telephone Encounter (Signed)
Per Kenney Houseman, patient returned call and is unfamiliar with the form and testing. Form can be discarded. Thanks!

## 2021-11-15 NOTE — Telephone Encounter (Signed)
ATC patient to ask if this is something she has requested.

## 2021-11-15 NOTE — Telephone Encounter (Signed)
Noted  

## 2021-11-18 ENCOUNTER — Telehealth: Payer: Self-pay

## 2021-11-18 DIAGNOSIS — J9611 Chronic respiratory failure with hypoxia: Secondary | ICD-10-CM

## 2021-11-18 MED ORDER — PULSE OXIMETER MISC: 0 refills | Status: DC

## 2021-11-18 NOTE — Telephone Encounter (Signed)
Patient needs prescription for Pulse Oximeter to take to medical supply store.  Please advise.  [phone]

## 2021-11-18 NOTE — Telephone Encounter (Signed)
Rx pending. Please advise.

## 2021-11-19 ENCOUNTER — Telehealth: Payer: Self-pay | Admitting: Family Medicine

## 2021-11-19 NOTE — Telephone Encounter (Signed)
Spoke with Butch Penny and advised provider did not think she needed this testing complete so we will not be completing forms.

## 2021-11-19 NOTE — Telephone Encounter (Signed)
Butch Penny with [facility] is calling following up about testing for Priscilla Cantrell She has been follow up on a fax since Oct 4th. Please call Butch Penny at [phone]

## 2021-11-19 NOTE — Telephone Encounter (Signed)
Pt advised rx will be placed up front. Advised of office hours for lunch.

## 2021-11-22 ENCOUNTER — Ambulatory Visit: Payer: Medicare Other | Admitting: Urology

## 2021-11-27 ENCOUNTER — Telehealth: Payer: Self-pay | Admitting: Pharmacist

## 2021-11-27 NOTE — Progress Notes (Signed)
[place]   11/27/2021  Priscilla Cantrell 25-Jul-1935 [id number]  Reason for referral: 2024 PAP re-enrollment Priscilla Cantrell  Referral source: Pharmacy Tech Referral medication(s): Priscilla Cantrell Current insurance:Medicare  Assessment:   Medication Review Findings:  Priscilla Cantrell PAP  Daliresp  Trelegy  Medication Assistance Findings:  Medication assistance needs identified: Xiidra at this time.  Spoke with patient-she does want to start the re-enrollment process for Xiidra. Verified address and confirmed no income change. Explained to her that she would need to spend $600 in 2024 before we can start the re-enrollment process for Trelegy. Also, explained Dailiresp PAP is ending in 2024. I will send information regarding this to Dr. Vickii Penna has an appointment with him 12/18/2021.    Plan: I will route patient assistance letter to [place] technician who will coordinate patient assistance program application process for medications listed above.  [facility] pharmacy technician will assist with obtaining all required documents from both patient and provider(s) and submit application(s) once completed.    Curlene Labrum, PharmD [place] Pharmacist Office: [phone]

## 2021-12-04 ENCOUNTER — Ambulatory Visit: Payer: Medicare Other | Admitting: Family Medicine

## 2021-12-04 ENCOUNTER — Telehealth: Payer: Self-pay | Admitting: Pharmacy Technician

## 2021-12-04 DIAGNOSIS — Z596 Low income: Secondary | ICD-10-CM

## 2021-12-04 NOTE — Progress Notes (Addendum)
[facility])                                            [place] Team    12/04/2021  Priscilla Cantrell 04/02/1935 [id number]                                      Medication Assistance Referral-FOR 2024 RE ENROLLMENT  Referral From:  [facility] Curlene Labrum  Medication/Company: Shirley Friar / [employer] Patient application portion:  Mailed Provider application portion: Faxed  to Dr. Arnoldo Hooker Provider address/fax verified via: Office website   Medication/Company: Trelegy / Maish Vaya Patient application portion:  Mailed Provider application portion: Faxed  to Dr. Chesley Mires Provider address/fax verified via: Office website  [name], [place]  [phone]

## 2021-12-04 NOTE — Progress Notes (Deleted)
OFFICE VISIT  12/04/2021  CC: No chief complaint on file.   Patient is a 86 y.o. female who presents for follow-up depression/anxiety, hypertension, hypothyroidism, and COPD.  INTERIM HX: ***  She has chronic hypoxic respiratory failure due to COPD. Followed by [name] in pulmonology.  Most recent follow-up with him was on 09/09/2021.  She was continued on Trelegy and Daliresp at that time.  She was instructed to continue oxygen at bedtime.   Past Medical History:  Diagnosis Date   Allergic rhinitis    with upper airway cough: as of 07/2017 pulm f/u she was instructed to use astelin, flonase, and saline more consistently   BPPV (benign paroxysmal positional vertigo) 03/2018   C. difficile colitis    Chest pain, non-cardiac    Cardiac CT showed no signif obstructive dz, mildly elevated calcium level (Dr. [name]).   Chronic cystitis with hematuria    [name]   Chronic hypoxemic respiratory failure ([place]) 02/02/2015   2L oxygen 24/7   Chronic renal insufficiency, stage 2 (mild)    GFR approx 60   Cold hands    NCS/EMGs normal.  Hand dysesthesias per neuro--reassured   COPD (chronic obstructive pulmonary disease) ([place])    GOLD 4.  spirometry 01/10/09 FEV 0.97(52%), FEV1% 47.  With chronic bronchitis as of 07/2017.   DDD (degenerative disc disease), cervical    1998 MRI C5-6 impingemt (left).  MRI 2021 (neurol) 1 level canal stenosis, 1 level foraminal stenosis--->PT   DDD (degenerative disc disease), lumbar    Depression with anxiety 04/15/2011   Diarrheal disease Summer 2017   ? 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).  As of 10/05/15 pt reports GI dx'd her with C diff and rx'd more flagyl and she is also on cholestyramine.  As of 02/2016, pt's sx's resolved completely.   Diverticulosis of colon    Procto '97 and colonoscopy 2002   Dysplastic nevus of upper extremity  04/2014   R tricep ([name])   Edema of both lower extremities due to peripheral venous insufficiency    Varicosities bilat, swelling L>R.  R baker's cyst.  Got vein clinic eval 02/2018->sclerotherapy recommended but since no hemorrhage or ulceration, insurance will not cover the procedure.   Family history of adverse reaction to anesthesia    mother died when patient age 45 receiving anesthesia for thyroid surgery   Fibrocystic breast disease    w/fibroadenoma.  Bx's showed NO atypia (Dr. Margot Chimes).  Mammo neg 2008.   GERD (gastroesophageal reflux disease)    History of double vision    Ophthalmologist, Dr. Herbert Deaner, is further evaluating this with MRI  orbits and limited brain (myesthenia gravis testing neg 07/2010)   History of home oxygen therapy    uses 2 liters at hs   Hypertension    EKG 03/2010 normal   Hypothyroidism    Hashimoto's, dx'd 1989   Idiopathic peripheral neuropathy 04/2018   Sensory (numb feet) S1 distribution bilat, c/w spinal stenosis sensory neuropathy (no pain). 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  Labs [place], MRI C spine->some spondylosis/stenosis.  UE NCS/EMS: normal 04/2019.   Lesion of right native kidney 2017   found on CT 02/2015.  F/u MRI 03/31/15 showed it to be smaller and less likely of any significance but repeat CT renal protocol in 60mowas recommended by radiology.  This was done 10/26/15 and showed no interval  change in the lesion, but b/c the lesion enhances with contrast, radiol rec'd urol consult (b/c pap renal RCC could not be excluded).  Saw urol 03/06/16--stable on u/s 09/2016 and 09/2017.   OAB (overactive bladder)    Osteoarthritis, hip, bilateral    she is s/p bilat THA as of 02/2018.   Osteoporosis    radius, T score -2.5->rec'd alendronate 08/2019->plan rpt DEXA 08/2021.   Pneumonia    Postmenopausal atrophic vaginitis    improved with estrace 2 X/week.   Prolapse of vaginal cuff after  hysterectomy    10/2018: Dr. Wayne Both. Mild colpocele 04/2019 per GYN (no surgery)   Pulmonary nodule    CT chest 12/10, June 2011.  No nodule on CT 06/2010.   Recurrent Clostridium difficile diarrhea    Episode 09/19/2016: resolved with prolonged course of flagyl.  11/2016 recurrence treated with 10d of Dificid (Fidaxomicin) by GI (Dr. Earlean Shawl).   Recurrent UTI    likely related to pt's atrophic vaginitis.  Urol started vaginal estrace 03/2016.   Rib fracture 09/2018   R 7th, laterally-->sustained when her dog pulled her over and she fell.   RLS (restless legs syndrome)    neuro->gabapentin trial 2020/21   Shingles 02/2014   R abd/flank   Spinal stenosis of lumbar region with neurogenic claudication    2008 MRI (L3-4, L4-5, L5-S1).  +S1 distribution sensory loss bilat    Past Surgical History:  Procedure Laterality Date   ABDOMINAL HYSTERECTOMY  1978   no BSO per pt--nonmalignant reasons   APPENDECTOMY     BREAST CYST EXCISION     Last screening mammogram 10/2010 was normal.   CHOLECYSTECTOMY  2001   COLONOSCOPY  04/2005; 12/06/2015   2007 NORMAL.  2017 adenomatous polyp x 1.  Mod sigmoid diverticulosis (Dr. Earlean Shawl).     DEXA  05/18/2017; 08/7019   2019 NORMAL (T-score 0.1).  08/2019->T score -2.5->alendronate started->plan Rpt 08/2021.   HIP CLOSED REDUCTION Right 12/25/2015   Procedure: CLOSED REDUCTION HIP;  Surgeon: Nicholes Stairs, MD;  Location: [facility];  Service: Orthopedics;  Laterality: Right;   NCV/EMS  10/2018   chronic and symmetric sensorimotor axonal polyneuropathy affecting the lower extremities   TONSILLECTOMY     TOTAL HIP ARTHROPLASTY Right 11/01/2014   Procedure: RIGHT TOTAL HIP ARTHROPLASTY;  Surgeon: Latanya Maudlin, MD;  Location: [facility];  Service: Orthopedics;  Laterality: Right;   TOTAL HIP ARTHROPLASTY Left 12/08/2017   Procedure: LEFT TOTAL HIP ARTHROPLASTY ANTERIOR APPROACH;  Surgeon: Paralee Cancel, MD;  Location: [facility];  Service: Orthopedics;  Laterality:  Left;  70 mins   TRANSTHORACIC ECHOCARDIOGRAM  08/2014   2016 EF 60-65%, grade I diast dysfxn. 06/21/21 unchanged.   TUBAL LIGATION  1974    Outpatient Medications Prior to Visit  Medication Sig Dispense Refill   albuterol (PROAIR HFA) 108 (90 Base) MCG/ACT inhaler Inhale 2 puffs into the lungs every 4 (four) hours as needed for wheezing or shortness of breath. 8.5 g 1   albuterol (PROVENTIL) (2.5 MG/3ML) 0.083% nebulizer solution Take 3 mLs (2.5 mg total) by nebulization every 6 (six) hours as needed for wheezing or shortness of breath. 360 mL 5   alendronate (FOSAMAX) 70 MG tablet TAKE 1 TABLET(70 MG) BY MOUTH 1 TIME A WEEK WITH A FULL GLASS OF WATER AND ON AN EMPTY STOMACH 12 tablet 3   Artificial Tear Solution (SOOTHE XP) SOLN Place 1 drop into both eyes 2 (two) times daily as needed (irritation).     aspirin  EC 81 MG tablet Take 81 mg by mouth daily.     azelastine (ASTELIN) 0.1 % nasal spray Place 2 sprays into both nostrils 2 (two) times daily. Use in each nostril as directed 30 mL 3   cetirizine (ZYRTEC) 10 MG tablet Take 1 tablet (10 mg total) by mouth daily. 30 tablet 6   ciprofloxacin (CILOXAN) 0.3 % ophthalmic solution Place 1 drop into the left eye every 4 (four) hours while awake. Administer 1 drop, every 2 hours, while awake, for 2 days. Then 1 drop, every 4 hours, while awake, for the next 5 days. 5 mL 0   clobetasol (TEMOVATE) 0.05 % external solution APPLY TO THE AFFECTED AREA EVERY DAY 50 mL 4   cycloSPORINE (RESTASIS OP) Apply to eye.     diazepam (VALIUM) 5 MG tablet TAKE 1 TABLET BY MOUTH DAILY AS NEEDED FOR ANXIETY AND INSOMNIA 30 tablet 5   diphenoxylate-atropine (LOMOTIL) 2.5-0.025 MG tablet Take 1 tablet by mouth 3 (three) times daily as needed.     fluticasone (CUTIVATE) 0.05 % cream Apply topically 2 (two) times daily. 30 g 1   fluticasone (FLONASE) 50 MCG/ACT nasal spray Place 2 sprays into both nostrils daily. 16 g 6   Fluticasone-Umeclidin-Vilant (TRELEGY ELLIPTA)  100-62.5-25 MCG/ACT AEPB Inhale 1 puff into the lungs daily. 180 each 3   furosemide (LASIX) 20 MG tablet 2 tabs qd alt with 1 tab qd 90 tablet 1   levothyroxine (SYNTHROID) 100 MCG tablet Take 1 tablet (100 mcg total) by mouth daily. 30 tablet 2   Lifitegrast (XIIDRA) 5 % SOLN Apply to eye in the morning and at bedtime.     Misc. Devices (PULSE OXIMETER) MISC Use to check heart rate and oxygen daily. 1 each 0   nitrofurantoin (MACRODANTIN) 50 MG capsule TAKE 1 CAPSULE(50 MG) BY MOUTH AT BEDTIME 90 capsule 3   nystatin (MYCOSTATIN) 100000 UNIT/ML suspension Swish 5 mL in mouth for several minutes then gargle and swallow.  Do this 4 times daily for 7 to 14 days. 140 mL 1   Probiotic Product (PROBIOTIC DAILY PO) Take 1 capsule by mouth daily.      roflumilast (DALIRESP) 500 MCG TABS tablet Take 1 tablet (500 mcg total) by mouth daily. 90 tablet 3   spironolactone (ALDACTONE) 25 MG tablet 1.5 tab po bid 270 tablet 0   triamcinolone cream (KENALOG) 0.1 % Apply 1 application topically 2 (two) times daily. 45 g 2   venlafaxine (EFFEXOR) 50 MG tablet Take 1 tablet (50 mg total) by mouth 2 (two) times daily. 180 tablet 3   Vibegron (GEMTESA) 75 MG TABS Take 1 capsule by mouth daily. 30 tablet 11   VISINE 0.05 % ophthalmic solution 1 drop  as needed     vitamin B-12 (CYANOCOBALAMIN) 100 MCG tablet Take 100 mcg by mouth daily.     VITAMIN D PO Take by mouth.     No facility-administered medications prior to visit.    Allergies  Allergen Reactions   Losartan Other (See Comments)    hyperkalemia   Penicillins Hives, Swelling and Rash    Has patient had a PCN reaction causing immediate rash, facial/tongue/throat swelling, SOB or lightheadedness with hypotension: YES Has patient had a PCN reaction causing severe rash involving mucus membranes or skin necrosis: NO Has patient had a PCN reaction that required hospitalization NO Has patient had a PCN reaction occurring within the last 10 years: NO If all  of the above answers are "NO", then  may proceed with Cephalosporin use.    Cefixime [Kdc:Cefixime] Other (See Comments)    unspecified   Gabapentin Other (See Comments)    Feels woozy   Indocin [Indomethacin] Other (See Comments)    Painful tongue and throat   Singulair [Montelukast Sodium]     Chest tightness, decreased mental clarity   Sulfa Antibiotics Other (See Comments)    unspecified   Ace Inhibitors Other (See Comments)    cough   Statins Other (See Comments)    Myalgias     ROS As per HPI  PE:    11/04/2021    2:14 PM 10/30/2021    2:49 PM 10/29/2021   10:00 PM  Vitals with BMI  Height '5\' 0"'$  '5\' 0"'$    Weight 120 lbs 120 lbs 3 oz   BMI 26.33 35.45   Systolic 625 638 937  Diastolic 60 72 74  Pulse 68 70 57     Physical Exam  ***  LABS:  Last CBC Lab Results  Component Value Date   WBC 21.2 (H) 10/29/2021   HGB 13.2 10/29/2021   HCT 40.1 10/29/2021   MCV 88.3 10/29/2021   MCH 29.1 10/29/2021   RDW 14.7 10/29/2021   PLT 222 34/28/7681   Last metabolic panel Lab Results  Component Value Date   GLUCOSE 95 10/29/2021   NA 136 10/29/2021   K 3.8 10/29/2021   CL 103 10/29/2021   CO2 25 10/29/2021   BUN 23 10/29/2021   CREATININE 1.00 10/29/2021   GFRNONAA 56 (L) 10/29/2021   CALCIUM 9.0 10/29/2021   PHOS 3.8 01/31/2013   PROT 6.1 (L) 10/29/2021   ALBUMIN 3.1 (L) 10/29/2021   BILITOT 0.4 10/29/2021   ALKPHOS 58 10/29/2021   AST 18 10/29/2021   ALT 42 10/29/2021   ANIONGAP 8 10/29/2021   Last lipids Lab Results  Component Value Date   CHOL 198 12/07/2020   HDL 66 12/07/2020   LDLCALC 113 (H) 12/07/2020   LDLDIRECT 131.3 03/07/2010   TRIG 88 12/07/2020   CHOLHDL 3.0 12/07/2020   Last hemoglobin A1c Lab Results  Component Value Date   HGBA1C 5.7 10/29/2011   Last thyroid functions Lab Results  Component Value Date   TSH 6.73 (H) 07/03/2021   T3TOTAL 97.2 07/11/2010   T4TOTAL 12.4 07/11/2010   IMPRESSION AND PLAN:  No  problem-specific Assessment & Plan notes found for this encounter.   An After Visit Summary was printed and given to the patient.  FOLLOW UP: No follow-ups on file.  Signed:  Crissie Sickles, MD           12/04/2021

## 2021-12-10 ENCOUNTER — Other Ambulatory Visit: Payer: Self-pay

## 2021-12-10 MED ORDER — LEVOTHYROXINE SODIUM 100 MCG PO TABS: 100.0000 ug | ORAL_TABLET | Freq: Every day | ORAL | 0 refills | Status: DC

## 2021-12-12 ENCOUNTER — Encounter: Payer: Self-pay | Admitting: Family Medicine

## 2021-12-16 ENCOUNTER — Ambulatory Visit: Payer: Medicare Other | Admitting: Neurology

## 2021-12-18 ENCOUNTER — Ambulatory Visit (INDEPENDENT_AMBULATORY_CARE_PROVIDER_SITE_OTHER): Payer: Medicare Other | Admitting: Pulmonary Disease

## 2021-12-18 ENCOUNTER — Encounter: Payer: Self-pay | Admitting: Pulmonary Disease

## 2021-12-18 VITALS — BP 122/60 | HR 66 | Temp 98.2°F | Ht 60.0 in | Wt 125.6 lb

## 2021-12-18 DIAGNOSIS — Z8616 Personal history of COVID-19: Secondary | ICD-10-CM

## 2021-12-18 DIAGNOSIS — J439 Emphysema, unspecified: Secondary | ICD-10-CM

## 2021-12-18 DIAGNOSIS — J9611 Chronic respiratory failure with hypoxia: Secondary | ICD-10-CM

## 2021-12-18 DIAGNOSIS — J4489 Other specified chronic obstructive pulmonary disease: Secondary | ICD-10-CM

## 2021-12-18 NOTE — Progress Notes (Signed)
[facility]  [facility]  Chief Complaint  Patient presents with   Follow-up    Breathing doing better but she is still getting SOB     Constitutional:  BP 122/60   Pulse 66   Temp 98.2 F (36.8 C)   Ht 5' (1.524 m)   Wt 125 lb 9.6 oz (57 kg)   SpO2 91% Comment: ra  BMI 24.53 kg/m   Past Medical History:  Chronic cystitis, DDD, Anxiety, Depression, C diff 2017 and 2018, Diverticulosis, GERD, HTN, Hypothyroidism, PNA, Shingles, Spinal stenosis  Past Surgical History:  She  has a past surgical history that includes Cholecystectomy (2001); Tubal ligation (1974); Abdominal hysterectomy (1978); Breast cyst excision; Appendectomy; Tonsillectomy; Colonoscopy (04/2005; 12/06/2015); transthoracic echocardiogram (08/2014); Total hip arthroplasty (Right, 11/01/2014); Hip Closed Reduction (Right, 12/25/2015); DEXA (05/18/2017; 08/7019); Total hip arthroplasty (Left, 12/08/2017); and NCV/EMS (10/2018).  Brief Summary:  Priscilla Cantrell is a 86 y.o. female former smoker with GOLD 4 COPD, ACE cough, and nocturnal hypoxemia.       Subjective:   Breathing and energy level are slowly improving.  Not having as much brain fog.  She will be getting married - doesn't have a date set yet.  She is moving in with her boyfriend and sold all her stuff.  She gets runny nose at times.  This sometimes happens after she eats.  Physical Exam:   Appearance - well kempt   ENMT - no sinus tenderness, no oral exudate, no LAN, Mallampati 2 airway, no stridor  Respiratory - equal breath sounds bilaterally, no wheezing or rales  CV - s1s2 regular rate and rhythm, no murmurs  Ext - no clubbing, no edema  Skin - no rashes  Psych - normal mood and affect       Pulmonary testing:  Spirometry 01/10/09>>FEV1 0.97(52%), FEV1% 47 March 2012>>Daliresp started RAST 05/04/17 >> negative, IgE 8 Spirometry 05/14/17 >> FEV1 0.91 (55%), FEV1% 48 PFT 10/29/18 >> FEV1 1.05 (68%), FEV1%  52, TLC 4.29 (96%), DLCO 44%  Sleep Tests:  PSG 11/14/11 >> AHI 0.3  ONO with RA 07/31/16 >> test time 6 hrs 41 min.  Average SpO2 87%, low SpO2 74%.  Spent 4 hrs 13 min with SpO2 < 88%. ONO with 2 liters 07/24/21 >> test time 3 hrs 58 min.  Baseline SpO2 97%, low SpO2 84%.  Spent 19 sec with SpO2 < 88%.  Cardiac Tests:  Echo 06/21/21 >> EF 60 to 65%, grade 1 DD, mild elevation in PASP, mild LA dilation  Social History:  She  reports that she quit smoking about 31 years ago. Her smoking use included cigarettes. She started smoking about 65 years ago. She has a 51.00 pack-year smoking history. She has never used smokeless tobacco. She reports current alcohol use. She reports that she does not use drugs.  Family History:  Her family history includes Cancer in her maternal aunt and paternal grandfather; Cirrhosis in her father; Diabetes in her daughter; Goiter in her mother; Hernia in her paternal grandmother; Hypertension in her paternal grandmother; Psoriasis in her father; Stroke in her maternal grandfather and paternal grandmother.     Assessment/Plan:   GOLD 4 COPD with chronic bronchitis. - continue trelegy, daliresp - prn albuterol - she has a nebulizer  COVID 19 infection. - from September 2023 - symptoms of fatigue, dyspnea and brain fog slowly improving  Allergic rhinitis with upper airway cough. - prn zyrtec, azelastine, flonase - can add atrovent nasal spray  if needed  Laryngopharyngeal reflux. - she is to follow up with GI   Chronic respiratory failure with hypoxia. - 2 liters oxygen with exertion and at night with sleep - goal SpO2 > 90%  Time Spent Involved in Patient Care on Day of Examination:  27 minutes  Follow up:   Patient Instructions  Follow up in 3 months  Medication List:   Allergies as of 12/18/2021       Reactions   Losartan Other (See Comments)   hyperkalemia   Penicillins Hives, Swelling, Rash   Has patient had a PCN reaction causing  immediate rash, facial/tongue/throat swelling, SOB or lightheadedness with hypotension: YES Has patient had a PCN reaction causing severe rash involving mucus membranes or skin necrosis: NO Has patient had a PCN reaction that required hospitalization NO Has patient had a PCN reaction occurring within the last 10 years: NO If all of the above answers are "NO", then may proceed with Cephalosporin use.   Cefixime [kdc:cefixime] Other (See Comments)   unspecified   Gabapentin Other (See Comments)   Feels woozy   Indocin [indomethacin] Other (See Comments)   Painful tongue and throat   Singulair [montelukast Sodium]    Chest tightness, decreased mental clarity   Sulfa Antibiotics Other (See Comments)   unspecified   Ace Inhibitors Other (See Comments)   cough   Statins Other (See Comments)   Myalgias        Medication List        Accurate as of December 18, 2021  1:48 PM. If you have any questions, ask your nurse or doctor.          albuterol 108 (90 Base) MCG/ACT inhaler Commonly known as: ProAir HFA Inhale 2 puffs into the lungs every 4 (four) hours as needed for wheezing or shortness of breath.   albuterol (2.5 MG/3ML) 0.083% nebulizer solution Commonly known as: PROVENTIL Take 3 mLs (2.5 mg total) by nebulization every 6 (six) hours as needed for wheezing or shortness of breath.   alendronate 70 MG tablet Commonly known as: FOSAMAX TAKE 1 TABLET(70 MG) BY MOUTH 1 TIME A WEEK WITH A FULL GLASS OF WATER AND ON AN EMPTY STOMACH   aspirin EC 81 MG tablet Take 81 mg by mouth daily.   azelastine 0.1 % nasal spray Commonly known as: ASTELIN Place 2 sprays into both nostrils 2 (two) times daily. Use in each nostril as directed   cetirizine 10 MG tablet Commonly known as: ZYRTEC Take 1 tablet (10 mg total) by mouth daily.   ciprofloxacin 0.3 % ophthalmic solution Commonly known as: Ciloxan Place 1 drop into the left eye every 4 (four) hours while awake. Administer 1  drop, every 2 hours, while awake, for 2 days. Then 1 drop, every 4 hours, while awake, for the next 5 days.   clobetasol 0.05 % external solution Commonly known as: TEMOVATE APPLY TO THE AFFECTED AREA EVERY DAY   diazepam 5 MG tablet Commonly known as: VALIUM TAKE 1 TABLET BY MOUTH DAILY AS NEEDED FOR ANXIETY AND INSOMNIA   diphenoxylate-atropine 2.5-0.025 MG tablet Commonly known as: LOMOTIL Take 1 tablet by mouth 3 (three) times daily as needed.   fluticasone 0.05 % cream Commonly known as: CUTIVATE Apply topically 2 (two) times daily.   fluticasone 50 MCG/ACT nasal spray Commonly known as: FLONASE Place 2 sprays into both nostrils daily.   furosemide 20 MG tablet Commonly known as: LASIX 2 tabs qd alt with 1 tab qd  Gemtesa 75 MG Tabs Generic drug: Vibegron Take 1 capsule by mouth daily.   levothyroxine 100 MCG tablet Commonly known as: SYNTHROID Take 1 tablet (100 mcg total) by mouth daily.   nitrofurantoin 50 MG capsule Commonly known as: MACRODANTIN TAKE 1 CAPSULE(50 MG) BY MOUTH AT BEDTIME   nystatin 100000 UNIT/ML suspension Commonly known as: MYCOSTATIN Swish 5 mL in mouth for several minutes then gargle and swallow.  Do this 4 times daily for 7 to 14 days.   PROBIOTIC DAILY PO Take 1 capsule by mouth daily.   Pulse Oximeter Misc Use to check heart rate and oxygen daily.   RESTASIS OP Apply to eye.   roflumilast 500 MCG Tabs tablet Commonly known as: DALIRESP Take 1 tablet (500 mcg total) by mouth daily.   Soothe XP Soln Place 1 drop into both eyes 2 (two) times daily as needed (irritation).   spironolactone 25 MG tablet Commonly known as: ALDACTONE 1.5 tab po bid   Trelegy Ellipta 100-62.5-25 MCG/ACT Aepb Generic drug: Fluticasone-Umeclidin-Vilant Inhale 1 puff into the lungs daily.   triamcinolone cream 0.1 % Commonly known as: KENALOG Apply 1 application topically 2 (two) times daily.   venlafaxine 50 MG tablet Commonly known as:  EFFEXOR Take 1 tablet (50 mg total) by mouth 2 (two) times daily.   Visine 0.05 % ophthalmic solution Generic drug: tetrahydrozoline 1 drop  as needed   vitamin B-12 100 MCG tablet Commonly known as: CYANOCOBALAMIN Take 100 mcg by mouth daily.   VITAMIN D PO Take by mouth.   Xiidra 5 % Soln Generic drug: Lifitegrast Apply to eye in the morning and at bedtime.        Signature:  Chesley Mires, MD [place] Pager - [phone] 12/18/2021, 1:48 PM

## 2021-12-18 NOTE — Patient Instructions (Signed)
Follow up in 3 months

## 2022-01-01 ENCOUNTER — Ambulatory Visit (HOSPITAL_COMMUNITY): Admission: RE | Admit: 2022-01-01 | Payer: Medicare Other | Source: Home / Self Care | Admitting: Gastroenterology

## 2022-01-01 ENCOUNTER — Encounter (HOSPITAL_COMMUNITY): Admission: RE | Payer: Self-pay | Source: Home / Self Care

## 2022-01-01 SURGERY — MANOMETRY, ANORECTAL
Anesthesia: Monitor Anesthesia Care

## 2022-01-08 ENCOUNTER — Telehealth: Payer: Self-pay | Admitting: Pulmonary Disease

## 2022-01-08 DIAGNOSIS — H02054 Trichiasis without entropian left upper eyelid: Secondary | ICD-10-CM | POA: Diagnosis not present

## 2022-01-08 DIAGNOSIS — H532 Diplopia: Secondary | ICD-10-CM | POA: Diagnosis not present

## 2022-01-08 DIAGNOSIS — H524 Presbyopia: Secondary | ICD-10-CM | POA: Diagnosis not present

## 2022-01-08 DIAGNOSIS — H04123 Dry eye syndrome of bilateral lacrimal glands: Secondary | ICD-10-CM | POA: Diagnosis not present

## 2022-01-08 DIAGNOSIS — H35362 Drusen (degenerative) of macula, left eye: Secondary | ICD-10-CM | POA: Diagnosis not present

## 2022-01-08 MED ORDER — TRELEGY ELLIPTA 100-62.5-25 MCG/ACT IN AEPB: 1.0000 | INHALATION_SPRAY | Freq: Every day | RESPIRATORY_TRACT | 3 refills | Status: DC

## 2022-01-08 MED ORDER — ALBUTEROL SULFATE HFA 108 (90 BASE) MCG/ACT IN AERS: 2.0000 | INHALATION_SPRAY | RESPIRATORY_TRACT | 1 refills | Status: DC | PRN

## 2022-01-08 NOTE — Telephone Encounter (Signed)
Inhalers refilled. Nothing further needed

## 2022-01-09 ENCOUNTER — Other Ambulatory Visit: Payer: Self-pay | Admitting: Family Medicine

## 2022-01-10 NOTE — Telephone Encounter (Signed)
Requesting: diazepam  Contract: n/a UDS: n/a Last Visit: 10/30/21 Next Visit: n/a Last Refill: 07/03/21 (30,5)  Please Advise. Med pending

## 2022-01-14 ENCOUNTER — Other Ambulatory Visit: Payer: Self-pay | Admitting: Family Medicine

## 2022-01-15 ENCOUNTER — Other Ambulatory Visit: Payer: Self-pay

## 2022-01-15 MED ORDER — LEVOTHYROXINE SODIUM 100 MCG PO TABS: 100.0000 ug | ORAL_TABLET | Freq: Every day | ORAL | 0 refills | Status: DC

## 2022-01-17 ENCOUNTER — Other Ambulatory Visit: Payer: Self-pay

## 2022-01-20 ENCOUNTER — Telehealth: Payer: Self-pay | Admitting: *Deleted

## 2022-01-20 MED ORDER — TRELEGY ELLIPTA 100-62.5-25 MCG/ACT IN AEPB: 1.0000 | INHALATION_SPRAY | Freq: Every day | RESPIRATORY_TRACT | 3 refills | Status: DC

## 2022-01-20 NOTE — Telephone Encounter (Signed)
Order placed. Nothing further needed. 

## 2022-01-23 ENCOUNTER — Telehealth: Payer: Self-pay

## 2022-01-23 NOTE — Telephone Encounter (Signed)
Priscilla Cantrell states she is taking Trelegy inhaler and using oxygen but is continuously SOB. Had covid around October 2023 and has had increased SOB since then. Wants to see [name] for an office visit to see if there is anything else that can be done or if he has any other suggestions for her to help her breathing. She feels she needs to be seen or talk to [name] to verify. Did inform Priscilla Cantrell that [name] is out of office currently and she is okay with waiting for a response from him instead of sending to [facility].   [name] please advise, can we add Priscilla Cantrell on to your schedule next time you're in office? She is okay with [facility] or [place] office.   Priscilla Cantrell prefers we call back at  [phone].

## 2022-01-26 NOTE — Telephone Encounter (Signed)
Okay to add her to schedule for earlier ROV.

## 2022-01-28 NOTE — Telephone Encounter (Signed)
ATC patient back. LVM for call back.  Will offer her appt for 02/10/22 at 2:30 (next time [name] is in  office) when she returns call.

## 2022-01-31 ENCOUNTER — Encounter: Payer: Self-pay | Admitting: Family Medicine

## 2022-01-31 ENCOUNTER — Ambulatory Visit (INDEPENDENT_AMBULATORY_CARE_PROVIDER_SITE_OTHER): Payer: Medicare Other | Admitting: Family Medicine

## 2022-01-31 VITALS — BP 109/61 | HR 70 | Temp 97.7°F | Ht 60.0 in | Wt 121.8 lb

## 2022-01-31 DIAGNOSIS — F3342 Major depressive disorder, recurrent, in full remission: Secondary | ICD-10-CM

## 2022-01-31 DIAGNOSIS — F411 Generalized anxiety disorder: Secondary | ICD-10-CM

## 2022-01-31 DIAGNOSIS — R600 Localized edema: Secondary | ICD-10-CM | POA: Diagnosis not present

## 2022-01-31 DIAGNOSIS — I10 Essential (primary) hypertension: Secondary | ICD-10-CM | POA: Diagnosis not present

## 2022-01-31 DIAGNOSIS — R3915 Urgency of urination: Secondary | ICD-10-CM | POA: Diagnosis not present

## 2022-01-31 LAB — POCT URINALYSIS DIPSTICK
Bilirubin, UA: NEGATIVE
Blood, UA: NEGATIVE
Glucose, UA: NEGATIVE
Leukocytes, UA: NEGATIVE
Nitrite, UA: NEGATIVE
Protein, UA: POSITIVE — AB
Spec Grav, UA: >=1.03 — AB (ref 1.010–1.025)
Urobilinogen, UA: 0.2 E.U./dL
pH, UA: 6 (ref 5.0–8.0)

## 2022-01-31 MED ORDER — LEVOTHYROXINE SODIUM 100 MCG PO TABS: 100.0000 ug | ORAL_TABLET | Freq: Every day | ORAL | 1 refills | Status: DC

## 2022-01-31 MED ORDER — SPIRONOLACTONE 25 MG PO TABS: ORAL_TABLET | ORAL | 0 refills | Status: DC

## 2022-01-31 NOTE — Progress Notes (Signed)
OFFICE VISIT  01/31/2022  CC:  Chief Complaint  Patient presents with   Medical Management of Chronic Issues    Patient is a 86 y.o. female who presents for follow-up hypertension, chronic bilateral lower extremity edema, hypothyroidism, and depression and anxiety. I last saw her on 10/30/2021, at which time she was recovering appropriately from a UTI. Also, she had recently had a bout of depression that was improving.  INTERIM HX: He says she is doing pretty well overall from a physical standpoint. She has some intermittent feeling of dyspnea, occasionally oxygen dropping into the upper 80s.  She puts her oxygen on and use her albuterol and feels better.  She has some phlegm in the back of her throat, usually worse in the morning. Of note, she has moved in with her boyfriend who has 4 dogs and she thinks she may be reacting to them somewhat. No fever. Lower extremity swelling has been minimal--- approximately 1 tab every few days.  Lately she has had the need to urinate fairly often but many times nothing comes out.  No dysuria.  Although it sounds like she continues to go through some tumultuous relationship issues with her daughter and grandson, she insists she is happy with her boyfriend. She is still currently working at [employer] but is not sure how long this will last.  She states she is sleeping well, takes her venlafaxine every day as well as the diazepam every night.  ROS as above, plus--> no fevers, no CP,  no dizziness, no HAs, no rashes, no melena/hematochezia.  No myalgias or arthralgias.  No focal weakness, paresthesias, or tremors.  No acute vision or hearing abnormalities.  No dysuria or unusual/new urinary urgency or frequency.  No recent changes in lower legs. No n/v/d or abd pain.  No palpitations.    Past Medical History:  Diagnosis Date   Allergic rhinitis    with upper airway cough: as of 07/2017 pulm f/u she was instructed to use astelin, flonase, and saline  more consistently   BPPV (benign paroxysmal positional vertigo) 03/2018   C. difficile colitis    Chest pain, non-cardiac    Cardiac CT showed no signif obstructive dz, mildly elevated calcium level (Dr. [name]).   Chronic cystitis with hematuria    [name]   Chronic hypoxemic respiratory failure ([place]) 02/02/2015   2L oxygen 24/7   Chronic renal insufficiency, stage 2 (mild)    GFR approx 60   Cold hands    NCS/EMGs normal.  Hand dysesthesias per neuro--reassured   COPD (chronic obstructive pulmonary disease) ([place])    GOLD 4.  spirometry 01/10/09 FEV 0.97(52%), FEV1% 47.  With chronic bronchitis as of 07/2017.   DDD (degenerative disc disease), cervical    1998 MRI C5-6 impingemt (left).  MRI 2021 (neurol) 1 level canal stenosis, 1 level foraminal stenosis--->PT   DDD (degenerative disc disease), lumbar    Depression with anxiety 04/15/2011   Diarrheal disease Summer 2017   ? 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).  As of 10/05/15 pt reports GI dx'd her with C diff and rx'd more flagyl and she is also on cholestyramine.  As of 02/2016, pt's sx's resolved completely.   Diverticulosis of colon    Procto '97 and colonoscopy 2002   Dysplastic nevus of upper extremity 04/2014   R tricep ([name])   Edema of both lower extremities due to peripheral venous insufficiency  Varicosities bilat, swelling L>R.  R baker's cyst.  Got vein clinic eval 02/2018->sclerotherapy recommended but since no hemorrhage or ulceration, insurance will not cover the procedure.   Family history of adverse reaction to anesthesia    mother died when patient age 29 receiving anesthesia for thyroid surgery   Fibrocystic breast disease    w/fibroadenoma.  Bx's showed NO atypia (Dr. Margot Chimes).  Mammo neg 2008.   GERD (gastroesophageal reflux disease)    History of double vision    Ophthalmologist, Dr.  Herbert Deaner, is further evaluating this with MRI  orbits and limited brain (myesthenia gravis testing neg 07/2010)   History of home oxygen therapy    uses 2 liters at hs   Hypertension    EKG 03/2010 normal   Hypothyroidism    Hashimoto's, dx'd 1989   Idiopathic peripheral neuropathy 04/2018   Sensory (numb feet) S1 distribution bilat, c/w spinal stenosis sensory neuropathy (no pain). 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  Labs [place], MRI C spine->some spondylosis/stenosis.  UE NCS/EMS: normal 04/2019.   Lesion of right native kidney 2017   found on CT 02/2015.  F/u MRI 03/31/15 showed it to be smaller and less likely of any significance but repeat CT renal protocol in 24mowas recommended by radiology.  This was done 10/26/15 and showed no interval change in the lesion, but b/c the lesion enhances with contrast, radiol rec'd urol consult (b/c pap renal RCC could not be excluded).  Saw urol 03/06/16--stable on u/s 09/2016 and 09/2017.   OAB (overactive bladder)    Osteoarthritis, hip, bilateral    she is s/p bilat THA as of 02/2018.   Osteoporosis    radius, T score -2.5->rec'd alendronate 08/2019->plan rpt DEXA 08/2021.   Pneumonia    Postmenopausal atrophic vaginitis    improved with estrace 2 X/week.   Prolapse of vaginal cuff after hysterectomy    10/2018: Dr. FWayne Both Mild colpocele 04/2019 per GYN (no surgery)   Pulmonary nodule    CT chest 12/10, June 2011.  No nodule on CT 06/2010.   Recurrent Clostridium difficile diarrhea    Episode 09/19/2016: resolved with prolonged course of flagyl.  11/2016 recurrence treated with 10d of Dificid (Fidaxomicin) by GI (Dr. MEarlean Shawl.   Recurrent UTI    likely related to pt's atrophic vaginitis.  Urol started vaginal estrace 03/2016.   Rib fracture 09/2018   R 7th, laterally-->sustained when her dog pulled her over and she fell.   RLS (restless legs syndrome)    neuro->gabapentin trial 2020/21    Shingles 02/2014   R abd/flank   Spinal stenosis of lumbar region with neurogenic claudication    2008 MRI (L3-4, L4-5, L5-S1).  +S1 distribution sensory loss bilat    Past Surgical History:  Procedure Laterality Date   ABDOMINAL HYSTERECTOMY  1978   no BSO per pt--nonmalignant reasons   APPENDECTOMY     BREAST CYST EXCISION     Last screening mammogram 10/2010 was normal.   CHOLECYSTECTOMY  2001   COLONOSCOPY  04/2005; 12/06/2015   2007 NORMAL.  2017 adenomatous polyp x 1.  Mod sigmoid diverticulosis (Dr. MEarlean Shawl.     DEXA  05/18/2017; 08/7019   2019 NORMAL (T-score 0.1).  08/2019->T score -2.5->alendronate started->plan Rpt 08/2021.   HIP CLOSED REDUCTION Right 12/25/2015   Procedure: CLOSED REDUCTION HIP;  Surgeon: JNicholes Stairs MD;  Location: [facility];  Service: Orthopedics;  Laterality: Right;   NCV/EMS  10/2018   chronic and symmetric  sensorimotor axonal polyneuropathy affecting the lower extremities   TONSILLECTOMY     TOTAL HIP ARTHROPLASTY Right 11/01/2014   Procedure: RIGHT TOTAL HIP ARTHROPLASTY;  Surgeon: Latanya Maudlin, MD;  Location: [facility];  Service: Orthopedics;  Laterality: Right;   TOTAL HIP ARTHROPLASTY Left 12/08/2017   Procedure: LEFT TOTAL HIP ARTHROPLASTY ANTERIOR APPROACH;  Surgeon: Paralee Cancel, MD;  Location: [facility];  Service: Orthopedics;  Laterality: Left;  70 mins   TRANSTHORACIC ECHOCARDIOGRAM  08/2014   2016 EF 60-65%, grade I diast dysfxn. 06/21/21 unchanged.   TUBAL LIGATION  1974    Outpatient Medications Prior to Visit  Medication Sig Dispense Refill   albuterol (PROAIR HFA) 108 (90 Base) MCG/ACT inhaler Inhale 2 puffs into the lungs every 4 (four) hours as needed for wheezing or shortness of breath. 8.5 g 1   alendronate (FOSAMAX) 70 MG tablet TAKE 1 TABLET(70 MG) BY MOUTH 1 TIME A WEEK WITH A FULL GLASS OF WATER AND ON AN EMPTY STOMACH 12 tablet 3   Artificial Tear Solution (SOOTHE XP) SOLN Place 1 drop into both eyes 2 (two) times daily as  needed (irritation).     aspirin EC 81 MG tablet Take 81 mg by mouth daily.     cetirizine (ZYRTEC) 10 MG tablet Take 1 tablet (10 mg total) by mouth daily. 30 tablet 6   ciprofloxacin (CILOXAN) 0.3 % ophthalmic solution Place 1 drop into the left eye every 4 (four) hours while awake. Administer 1 drop, every 2 hours, while awake, for 2 days. Then 1 drop, every 4 hours, while awake, for the next 5 days. 5 mL 0   clobetasol (TEMOVATE) 0.05 % external solution APPLY TO THE AFFECTED AREA EVERY DAY 50 mL 4   cycloSPORINE (RESTASIS OP) Apply to eye.     diazepam (VALIUM) 5 MG tablet TAKE 1 TABLET BY MOUTH DAILY AS NEEDED FOR ANXIETY OR INSOMNIA 30 tablet 3   Fluticasone-Umeclidin-Vilant (TRELEGY ELLIPTA) 100-62.5-25 MCG/ACT AEPB Inhale 1 puff into the lungs daily. 180 each 3   furosemide (LASIX) 20 MG tablet 2 tabs qd alt with 1 tab qd 90 tablet 1   Lifitegrast (XIIDRA) 5 % SOLN Apply to eye in the morning and at bedtime.     Misc. Devices (PULSE OXIMETER) MISC Use to check heart rate and oxygen daily. 1 each 0   nitrofurantoin (MACRODANTIN) 50 MG capsule TAKE 1 CAPSULE(50 MG) BY MOUTH AT BEDTIME 90 capsule 3   Probiotic Product (PROBIOTIC DAILY PO) Take 1 capsule by mouth daily.      roflumilast (DALIRESP) 500 MCG TABS tablet Take 1 tablet (500 mcg total) by mouth daily. 90 tablet 3   venlafaxine (EFFEXOR) 50 MG tablet Take 1 tablet (50 mg total) by mouth 2 (two) times daily. 180 tablet 3   VISINE 0.05 % ophthalmic solution 1 drop  as needed     vitamin B-12 (CYANOCOBALAMIN) 100 MCG tablet Take 100 mcg by mouth daily.     VITAMIN D PO Take by mouth.     albuterol (PROVENTIL) (2.5 MG/3ML) 0.083% nebulizer solution Take 3 mLs (2.5 mg total) by nebulization every 6 (six) hours as needed for wheezing or shortness of breath. (Patient not taking: Reported on 01/31/2022) 360 mL 5   azelastine (ASTELIN) 0.1 % nasal spray Place 2 sprays into both nostrils 2 (two) times daily. Use in each nostril as directed  (Patient not taking: Reported on 01/31/2022) 30 mL 3   diphenoxylate-atropine (LOMOTIL) 2.5-0.025 MG tablet Take 1 tablet by  mouth 3 (three) times daily as needed.     fluticasone (CUTIVATE) 0.05 % cream Apply topically 2 (two) times daily. (Patient not taking: Reported on 01/31/2022) 30 g 1   fluticasone (FLONASE) 50 MCG/ACT nasal spray Place 2 sprays into both nostrils daily. (Patient not taking: Reported on 01/31/2022) 16 g 6   triamcinolone cream (KENALOG) 0.1 % Apply 1 application topically 2 (two) times daily. (Patient not taking: Reported on 01/31/2022) 45 g 2   Vibegron (GEMTESA) 75 MG TABS Take 1 capsule by mouth daily. (Patient not taking: Reported on 01/31/2022) 30 tablet 11   levothyroxine (SYNTHROID) 100 MCG tablet Take 1 tablet (100 mcg total) by mouth daily. 30 tablet 0   nystatin (MYCOSTATIN) 100000 UNIT/ML suspension Swish 5 mL in mouth for several minutes then gargle and swallow.  Do this 4 times daily for 7 to 14 days. (Patient not taking: Reported on 01/31/2022) 140 mL 1   spironolactone (ALDACTONE) 25 MG tablet 1.5 tab po bid 270 tablet 0   No facility-administered medications prior to visit.    Allergies  Allergen Reactions   Losartan Other (See Comments)    hyperkalemia   Penicillins Hives, Swelling and Rash    Has patient had a PCN reaction causing immediate rash, facial/tongue/throat swelling, SOB or lightheadedness with hypotension: YES Has patient had a PCN reaction causing severe rash involving mucus membranes or skin necrosis: NO Has patient had a PCN reaction that required hospitalization NO Has patient had a PCN reaction occurring within the last 10 years: NO If all of the above answers are "NO", then may proceed with Cephalosporin use.    Cefixime [Kdc:Cefixime] Other (See Comments)    unspecified   Gabapentin Other (See Comments)    Feels woozy   Indocin [Indomethacin] Other (See Comments)    Painful tongue and throat   Singulair [Montelukast Sodium]      Chest tightness, decreased mental clarity   Sulfa Antibiotics Other (See Comments)    unspecified   Ace Inhibitors Other (See Comments)    cough   Statins Other (See Comments)    Myalgias     Review of Systems As per HPI  PE:    01/31/2022    3:00 PM 12/18/2021   12:02 PM 11/04/2021    2:14 PM  Vitals with BMI  Height '5\' 0"'$  '5\' 0"'$  '5\' 0"'$   Weight 121 lbs 13 oz 125 lbs 10 oz 120 lbs  BMI 23.79 12.45 80.99  Systolic 833 825 053  Diastolic 61 60 60  Pulse 70 66 68  02 sat 94% RA today  Physical Exam  Gen: Alert, well appearing.  Patient is oriented to person, place, time, and situation. AFFECT: pleasant, lucid thought and speech. CV: RRR, no m/r/g.   LUNGS: CTA bilat, nonlabored resps, good aeration in all lung fields. EXT: no clubbing or cyanosis.  no edema.    LABS:  Last CBC Lab Results  Component Value Date   WBC 21.2 (H) 10/29/2021   HGB 13.2 10/29/2021   HCT 40.1 10/29/2021   MCV 88.3 10/29/2021   MCH 29.1 10/29/2021   RDW 14.7 10/29/2021   PLT 222 97/67/3419   Last metabolic panel Lab Results  Component Value Date   GLUCOSE 95 10/29/2021   NA 136 10/29/2021   K 3.8 10/29/2021   CL 103 10/29/2021   CO2 25 10/29/2021   BUN 23 10/29/2021   CREATININE 1.00 10/29/2021   GFRNONAA 56 (L) 10/29/2021   CALCIUM 9.0 10/29/2021  PHOS 3.8 01/31/2013   PROT 6.1 (L) 10/29/2021   ALBUMIN 3.1 (L) 10/29/2021   BILITOT 0.4 10/29/2021   ALKPHOS 58 10/29/2021   AST 18 10/29/2021   ALT 42 10/29/2021   ANIONGAP 8 10/29/2021   Last thyroid functions Lab Results  Component Value Date   TSH 6.73 (H) 07/03/2021   T3TOTAL 97.2 07/11/2010   T4TOTAL 12.4 07/11/2010   IMPRESSION AND PLAN:  #1 hypertension, well-controlled on spironolactone 1.5 of the 25 mg tabs twice daily.  #2 anxiety and depression.  Fairly stable.  Continue venlafaxine 50 mg twice daily and diazepam 5 mg nightly.  #3 bilateral lower extremity edema due to chronic venous insufficiency. No  edema at this time.  She has Lasix to use as needed.  4.  Recurrent UTI. UA today: UA normal today.  An After Visit Summary was printed and given to the patient.  FOLLOW UP: Return in about 3 months (around 05/02/2022) for routine chronic illness f/u.  Signed:  Crissie Sickles, MD           01/31/2022

## 2022-02-08 DIAGNOSIS — J411 Mucopurulent chronic bronchitis: Secondary | ICD-10-CM | POA: Diagnosis not present

## 2022-02-10 DIAGNOSIS — J411 Mucopurulent chronic bronchitis: Secondary | ICD-10-CM | POA: Diagnosis not present

## 2022-02-11 DIAGNOSIS — R0902 Hypoxemia: Secondary | ICD-10-CM | POA: Diagnosis not present

## 2022-02-11 DIAGNOSIS — J984 Other disorders of lung: Secondary | ICD-10-CM | POA: Diagnosis not present

## 2022-02-11 DIAGNOSIS — Z96641 Presence of right artificial hip joint: Secondary | ICD-10-CM | POA: Diagnosis not present

## 2022-02-11 DIAGNOSIS — J449 Chronic obstructive pulmonary disease, unspecified: Secondary | ICD-10-CM | POA: Diagnosis not present

## 2022-02-12 ENCOUNTER — Telehealth: Payer: Self-pay | Admitting: Pulmonary Disease

## 2022-02-12 NOTE — Telephone Encounter (Signed)
ATC X1 LVM for patient to call us back

## 2022-02-17 ENCOUNTER — Telehealth: Payer: Self-pay | Admitting: Family Medicine

## 2022-02-17 NOTE — Telephone Encounter (Signed)
Left message for patient to schedule Annual Wellness Visit.  Please schedule (office,telephone/video call) with Nurse Health Advisor at [facility].  Please call [phone] ask for [facility]

## 2022-02-19 ENCOUNTER — Other Ambulatory Visit (HOSPITAL_COMMUNITY): Payer: Self-pay

## 2022-02-21 ENCOUNTER — Other Ambulatory Visit: Payer: Self-pay

## 2022-02-21 ENCOUNTER — Ambulatory Visit (INDEPENDENT_AMBULATORY_CARE_PROVIDER_SITE_OTHER): Payer: PRIVATE HEALTH INSURANCE | Admitting: Family Medicine

## 2022-02-21 ENCOUNTER — Emergency Department (HOSPITAL_COMMUNITY): Payer: Medicare PPO

## 2022-02-21 ENCOUNTER — Encounter (HOSPITAL_COMMUNITY): Payer: Self-pay | Admitting: Internal Medicine

## 2022-02-21 ENCOUNTER — Encounter: Payer: Self-pay | Admitting: Family Medicine

## 2022-02-21 ENCOUNTER — Inpatient Hospital Stay (HOSPITAL_COMMUNITY)
Admission: EM | Admit: 2022-02-21 | Discharge: 2022-02-24 | DRG: 871 | Disposition: A | Discharge status: Home Health | Payer: Medicare PPO | Attending: Family Medicine | Admitting: Family Medicine

## 2022-02-21 VITALS — BP 140/90 | HR 81

## 2022-02-21 DIAGNOSIS — E039 Hypothyroidism, unspecified: Secondary | ICD-10-CM | POA: Diagnosis present

## 2022-02-21 DIAGNOSIS — Z79890 Hormone replacement therapy (postmenopausal): Secondary | ICD-10-CM

## 2022-02-21 DIAGNOSIS — F418 Other specified anxiety disorders: Secondary | ICD-10-CM | POA: Diagnosis present

## 2022-02-21 DIAGNOSIS — N182 Chronic kidney disease, stage 2 (mild): Secondary | ICD-10-CM | POA: Diagnosis present

## 2022-02-21 DIAGNOSIS — F419 Anxiety disorder, unspecified: Secondary | ICD-10-CM | POA: Diagnosis present

## 2022-02-21 DIAGNOSIS — J9621 Acute and chronic respiratory failure with hypoxia: Secondary | ICD-10-CM | POA: Diagnosis present

## 2022-02-21 DIAGNOSIS — J4489 Other specified chronic obstructive pulmonary disease: Secondary | ICD-10-CM | POA: Diagnosis present

## 2022-02-21 DIAGNOSIS — I10 Essential (primary) hypertension: Secondary | ICD-10-CM | POA: Diagnosis not present

## 2022-02-21 DIAGNOSIS — M810 Age-related osteoporosis without current pathological fracture: Secondary | ICD-10-CM | POA: Diagnosis present

## 2022-02-21 DIAGNOSIS — J111 Influenza due to unidentified influenza virus with other respiratory manifestations: Secondary | ICD-10-CM | POA: Diagnosis present

## 2022-02-21 DIAGNOSIS — Z8616 Personal history of COVID-19: Secondary | ICD-10-CM

## 2022-02-21 DIAGNOSIS — Z888 Allergy status to other drugs, medicaments and biological substances status: Secondary | ICD-10-CM

## 2022-02-21 DIAGNOSIS — I129 Hypertensive chronic kidney disease with stage 1 through stage 4 chronic kidney disease, or unspecified chronic kidney disease: Secondary | ICD-10-CM | POA: Diagnosis present

## 2022-02-21 DIAGNOSIS — Z800 Family history of malignant neoplasm of digestive organs: Secondary | ICD-10-CM

## 2022-02-21 DIAGNOSIS — R062 Wheezing: Secondary | ICD-10-CM | POA: Diagnosis not present

## 2022-02-21 DIAGNOSIS — Z882 Allergy status to sulfonamides status: Secondary | ICD-10-CM | POA: Diagnosis not present

## 2022-02-21 DIAGNOSIS — Z9981 Dependence on supplemental oxygen: Secondary | ICD-10-CM | POA: Diagnosis not present

## 2022-02-21 DIAGNOSIS — K219 Gastro-esophageal reflux disease without esophagitis: Secondary | ICD-10-CM | POA: Diagnosis present

## 2022-02-21 DIAGNOSIS — Z880 Allergy status to penicillin: Secondary | ICD-10-CM | POA: Diagnosis not present

## 2022-02-21 DIAGNOSIS — A4189 Other specified sepsis: Principal | ICD-10-CM | POA: Diagnosis present

## 2022-02-21 DIAGNOSIS — J449 Chronic obstructive pulmonary disease, unspecified: Secondary | ICD-10-CM | POA: Diagnosis not present

## 2022-02-21 DIAGNOSIS — F32A Depression, unspecified: Secondary | ICD-10-CM | POA: Diagnosis present

## 2022-02-21 DIAGNOSIS — Z79899 Other long term (current) drug therapy: Secondary | ICD-10-CM

## 2022-02-21 DIAGNOSIS — Z823 Family history of stroke: Secondary | ICD-10-CM

## 2022-02-21 DIAGNOSIS — Z7951 Long term (current) use of inhaled steroids: Secondary | ICD-10-CM

## 2022-02-21 DIAGNOSIS — G609 Hereditary and idiopathic neuropathy, unspecified: Secondary | ICD-10-CM | POA: Diagnosis present

## 2022-02-21 DIAGNOSIS — J441 Chronic obstructive pulmonary disease with (acute) exacerbation: Secondary | ICD-10-CM | POA: Diagnosis present

## 2022-02-21 DIAGNOSIS — Z96643 Presence of artificial hip joint, bilateral: Secondary | ICD-10-CM | POA: Diagnosis present

## 2022-02-21 DIAGNOSIS — J101 Influenza due to other identified influenza virus with other respiratory manifestations: Secondary | ICD-10-CM | POA: Diagnosis not present

## 2022-02-21 DIAGNOSIS — N3281 Overactive bladder: Secondary | ICD-10-CM | POA: Diagnosis present

## 2022-02-21 DIAGNOSIS — Z881 Allergy status to other antibiotic agents status: Secondary | ICD-10-CM | POA: Diagnosis not present

## 2022-02-21 DIAGNOSIS — J9601 Acute respiratory failure with hypoxia: Secondary | ICD-10-CM | POA: Diagnosis present

## 2022-02-21 DIAGNOSIS — Z8249 Family history of ischemic heart disease and other diseases of the circulatory system: Secondary | ICD-10-CM

## 2022-02-21 DIAGNOSIS — Z87891 Personal history of nicotine dependence: Secondary | ICD-10-CM | POA: Diagnosis not present

## 2022-02-21 DIAGNOSIS — R059 Cough, unspecified: Secondary | ICD-10-CM | POA: Diagnosis not present

## 2022-02-21 DIAGNOSIS — Z833 Family history of diabetes mellitus: Secondary | ICD-10-CM | POA: Diagnosis not present

## 2022-02-21 DIAGNOSIS — Z1152 Encounter for screening for COVID-19: Secondary | ICD-10-CM

## 2022-02-21 DIAGNOSIS — R0602 Shortness of breath: Secondary | ICD-10-CM | POA: Diagnosis not present

## 2022-02-21 DIAGNOSIS — Z801 Family history of malignant neoplasm of trachea, bronchus and lung: Secondary | ICD-10-CM

## 2022-02-21 DIAGNOSIS — Z7983 Long term (current) use of bisphosphonates: Secondary | ICD-10-CM

## 2022-02-21 DIAGNOSIS — A419 Sepsis, unspecified organism: Secondary | ICD-10-CM | POA: Insufficient documentation

## 2022-02-21 DIAGNOSIS — Z7982 Long term (current) use of aspirin: Secondary | ICD-10-CM

## 2022-02-21 LAB — RESP PANEL BY RT-PCR (RSV, FLU A&B, COVID)  RVPGX2
Influenza A by PCR: POSITIVE — AB
Influenza B by PCR: NEGATIVE
Resp Syncytial Virus by PCR: NEGATIVE
SARS Coronavirus 2 by RT PCR: NEGATIVE

## 2022-02-21 LAB — CBC WITH DIFFERENTIAL/PLATELET
Abs Immature Granulocytes: 0.08 10*3/uL — ABNORMAL HIGH (ref 0.00–0.07)
Basophils Absolute: 0 10*3/uL (ref 0.0–0.1)
Basophils Relative: 0 %
Eosinophils Absolute: 0.1 10*3/uL (ref 0.0–0.5)
Eosinophils Relative: 1 %
HCT: 39.1 % (ref 36.0–46.0)
Hemoglobin: 12.5 g/dL (ref 12.0–15.0)
Immature Granulocytes: 1 %
Lymphocytes Relative: 21 %
Lymphs Abs: 2.5 10*3/uL (ref 0.7–4.0)
MCH: 29.8 pg (ref 26.0–34.0)
MCHC: 32 g/dL (ref 30.0–36.0)
MCV: 93.1 fL (ref 80.0–100.0)
Monocytes Absolute: 0.8 10*3/uL (ref 0.1–1.0)
Monocytes Relative: 7 %
Neutro Abs: 8.6 10*3/uL — ABNORMAL HIGH (ref 1.7–7.7)
Neutrophils Relative %: 70 %
Platelets: 241 10*3/uL (ref 150–400)
RBC: 4.2 MIL/uL (ref 3.87–5.11)
RDW: 13.1 % (ref 11.5–15.5)
WBC: 12.1 10*3/uL — ABNORMAL HIGH (ref 4.0–10.5)
nRBC: 0 % (ref 0.0–0.2)

## 2022-02-21 LAB — BASIC METABOLIC PANEL
Anion gap: 10 (ref 5–15)
BUN: 15 mg/dL (ref 8–23)
CO2: 29 mmol/L (ref 22–32)
Calcium: 9.2 mg/dL (ref 8.9–10.3)
Chloride: 100 mmol/L (ref 98–111)
Creatinine, Ser: 0.88 mg/dL (ref 0.44–1.00)
GFR, Estimated: >60 mL/min (ref >60)
Glucose, Bld: 112 mg/dL — ABNORMAL HIGH (ref 70–99)
Potassium: 4.1 mmol/L (ref 3.5–5.1)
Sodium: 139 mmol/L (ref 135–145)

## 2022-02-21 MED ORDER — IPRATROPIUM-ALBUTEROL 0.5-2.5 (3) MG/3ML IN SOLN
3.0000 mL | Freq: Four times a day (QID) | RESPIRATORY_TRACT | Status: DC
  Administered 2022-02-21: 3 mL via RESPIRATORY_TRACT
  Filled 2022-02-21 (×2): qty 3

## 2022-02-21 MED ORDER — GUAIFENESIN ER 600 MG PO TB12
600.0000 mg | ORAL_TABLET | Freq: Two times a day (BID) | ORAL | Status: DC
  Administered 2022-02-21 – 2022-02-24 (×6): 600 mg via ORAL
  Filled 2022-02-21 (×6): qty 1

## 2022-02-21 MED ORDER — LEVOTHYROXINE SODIUM 100 MCG PO TABS
100.0000 ug | ORAL_TABLET | Freq: Every day | ORAL | Status: DC
  Administered 2022-02-22 – 2022-02-24 (×3): 100 ug via ORAL
  Filled 2022-02-21 (×3): qty 1

## 2022-02-21 MED ORDER — ALBUTEROL SULFATE (2.5 MG/3ML) 0.083% IN NEBU: 2.5000 mg | INHALATION_SOLUTION | RESPIRATORY_TRACT | Status: DC | PRN

## 2022-02-21 MED ORDER — DIAZEPAM 5 MG PO TABS
5.0000 mg | ORAL_TABLET | Freq: Every day | ORAL | Status: DC | PRN
  Administered 2022-02-22: 5 mg via ORAL
  Filled 2022-02-21: qty 1

## 2022-02-21 MED ORDER — ACETAMINOPHEN 325 MG PO TABS: 650.0000 mg | ORAL_TABLET | Freq: Four times a day (QID) | ORAL | Status: DC | PRN

## 2022-02-21 MED ORDER — AZITHROMYCIN 500 MG PO TABS
500.0000 mg | ORAL_TABLET | Freq: Every day | ORAL | Status: DC
  Administered 2022-02-22 – 2022-02-24 (×3): 500 mg via ORAL
  Filled 2022-02-21 (×3): qty 1

## 2022-02-21 MED ORDER — FLUTICASONE FUROATE-VILANTEROL 100-25 MCG/ACT IN AEPB
1.0000 | INHALATION_SPRAY | Freq: Every day | RESPIRATORY_TRACT | Status: DC
  Administered 2022-02-22 – 2022-02-24 (×3): 1 via RESPIRATORY_TRACT
  Filled 2022-02-21: qty 28

## 2022-02-21 MED ORDER — OSELTAMIVIR PHOSPHATE 30 MG PO CAPS
30.0000 mg | ORAL_CAPSULE | Freq: Two times a day (BID) | ORAL | Status: DC
  Administered 2022-02-21 – 2022-02-24 (×6): 30 mg via ORAL
  Filled 2022-02-21 (×6): qty 1

## 2022-02-21 MED ORDER — METHYLPREDNISOLONE SODIUM SUCC 40 MG IJ SOLR
40.0000 mg | Freq: Two times a day (BID) | INTRAMUSCULAR | Status: DC
  Administered 2022-02-21 – 2022-02-24 (×6): 40 mg via INTRAVENOUS
  Filled 2022-02-21 (×6): qty 1

## 2022-02-21 MED ORDER — ONDANSETRON HCL 4 MG/2ML IJ SOLN: 4.0000 mg | Freq: Four times a day (QID) | INTRAMUSCULAR | Status: DC | PRN

## 2022-02-21 MED ORDER — OSELTAMIVIR PHOSPHATE 75 MG PO CAPS: 75.0000 mg | ORAL_CAPSULE | Freq: Two times a day (BID) | ORAL | Status: DC

## 2022-02-21 MED ORDER — ENOXAPARIN SODIUM 40 MG/0.4ML IJ SOSY
40.0000 mg | PREFILLED_SYRINGE | INTRAMUSCULAR | Status: DC
  Administered 2022-02-22 – 2022-02-23 (×2): 40 mg via SUBCUTANEOUS
  Filled 2022-02-21 (×2): qty 0.4

## 2022-02-21 MED ORDER — IPRATROPIUM-ALBUTEROL 0.5-2.5 (3) MG/3ML IN SOLN
3.0000 mL | Freq: Three times a day (TID) | RESPIRATORY_TRACT | Status: DC
  Administered 2022-02-22: 3 mL via RESPIRATORY_TRACT
  Filled 2022-02-21: qty 3

## 2022-02-21 MED ORDER — ALBUTEROL SULFATE (2.5 MG/3ML) 0.083% IN NEBU
2.5000 mg | INHALATION_SOLUTION | Freq: Once | RESPIRATORY_TRACT | Status: AC
  Administered 2022-02-21: 2.5 mg via RESPIRATORY_TRACT

## 2022-02-21 MED ORDER — ACETAMINOPHEN 650 MG RE SUPP: 650.0000 mg | Freq: Four times a day (QID) | RECTAL | Status: DC | PRN

## 2022-02-21 MED ORDER — SPIRONOLACTONE 25 MG PO TABS
37.5000 mg | ORAL_TABLET | Freq: Two times a day (BID) | ORAL | Status: DC
  Administered 2022-02-22 – 2022-02-24 (×5): 37.5 mg via ORAL
  Filled 2022-02-21 (×5): qty 1

## 2022-02-21 MED ORDER — SENNOSIDES-DOCUSATE SODIUM 8.6-50 MG PO TABS: 1.0000 | ORAL_TABLET | Freq: Every evening | ORAL | Status: DC | PRN

## 2022-02-21 MED ORDER — SODIUM CHLORIDE 0.9 % IV SOLN
500.0000 mg | INTRAVENOUS | Status: AC
  Administered 2022-02-21: 500 mg via INTRAVENOUS
  Filled 2022-02-21: qty 5

## 2022-02-21 MED ORDER — ONDANSETRON HCL 4 MG PO TABS: 4.0000 mg | ORAL_TABLET | Freq: Four times a day (QID) | ORAL | Status: DC | PRN

## 2022-02-21 MED ORDER — ENOXAPARIN SODIUM 30 MG/0.3ML IJ SOSY: 30.0000 mg | PREFILLED_SYRINGE | INTRAMUSCULAR | Status: DC

## 2022-02-21 MED ORDER — VENLAFAXINE HCL 50 MG PO TABS
50.0000 mg | ORAL_TABLET | Freq: Two times a day (BID) | ORAL | Status: DC
  Administered 2022-02-21 – 2022-02-24 (×6): 50 mg via ORAL
  Filled 2022-02-21 (×6): qty 1

## 2022-02-21 MED ORDER — UMECLIDINIUM BROMIDE 62.5 MCG/ACT IN AEPB
1.0000 | INHALATION_SPRAY | Freq: Every day | RESPIRATORY_TRACT | Status: DC
  Administered 2022-02-22 – 2022-02-24 (×3): 1 via RESPIRATORY_TRACT
  Filled 2022-02-21: qty 7

## 2022-02-21 NOTE — Addendum Note (Signed)
Addended by: Deveron Furlong D on: 02/21/2022 03:35 PM   Modules accepted: Orders

## 2022-02-21 NOTE — Hospital Course (Signed)
Priscilla Cantrell is a 87 y.o. female with medical history significant for COPD, chronic respiratory failure with hypoxia on 2 L O2 via [place], chronic lower extremity edema, depression/anxiety who is admitted with acute on chronic hypoxic respiratory failure due to COPD exacerbation in setting of influenza A.

## 2022-02-21 NOTE — ED Provider Notes (Signed)
[facility] Provider Note   CSN: [id number] Arrival date & time: 02/21/22  1546     History  Chief Complaint  Patient presents with   Shortness of Breath    Priscilla Cantrell is a 87 y.o. female.   Shortness of Breath Patient with shortness of breath.  History of COPD.  On chronic oxygen at 2 L at home.  More short of breath the last few days.  EMS arrived and given 2 DuoNeb's 125 mg of Solu-Medrol.  Sent from primary care office.  Had saturations of 79%.  More short of breath.  Wheezing.  Mild chest tightness.  No fevers.  States she had COVID and RSV a few months ago.   Past Medical History:  Diagnosis Date   Allergic rhinitis    with upper airway cough: as of 07/2017 pulm f/u she was instructed to use astelin, flonase, and saline more consistently   BPPV (benign paroxysmal positional vertigo) 03/2018   C. difficile colitis    Chest pain, non-cardiac    Cardiac CT showed no signif obstructive dz, mildly elevated calcium level (Dr. [name]).   Chronic cystitis with hematuria    [name]   Chronic hypoxemic respiratory failure ([place]) 02/02/2015   2L oxygen 24/7   Chronic renal insufficiency, stage 2 (mild)    GFR approx 60   Cold hands    NCS/EMGs normal.  Hand dysesthesias per neuro--reassured   COPD (chronic obstructive pulmonary disease) ([place])    GOLD 4.  spirometry 01/10/09 FEV 0.97(52%), FEV1% 47.  With chronic bronchitis as of 07/2017.   DDD (degenerative disc disease), cervical    1998 MRI C5-6 impingemt (left).  MRI 2021 (neurol) 1 level canal stenosis, 1 level foraminal stenosis--->PT   DDD (degenerative disc disease), lumbar    Depression with anxiety 04/15/2011   Diarrheal disease Summer 2017   ? 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).  As of 10/05/15 pt reports GI dx'd her with C diff and rx'd more flagyl  and she is also on cholestyramine.  As of 02/2016, pt's sx's resolved completely.   Diverticulosis of colon    Procto '97 and colonoscopy 2002   Dysplastic nevus of upper extremity 04/2014   R tricep ([name])   Edema of both lower extremities due to peripheral venous insufficiency    Varicosities bilat, swelling L>R.  R baker's cyst.  Got vein clinic eval 02/2018->sclerotherapy recommended but since no hemorrhage or ulceration, insurance will not cover the procedure.   Family history of adverse reaction to anesthesia    mother died when patient age 38 receiving anesthesia for thyroid surgery   Fibrocystic breast disease    w/fibroadenoma.  Bx's showed NO atypia (Dr. Margot Chimes).  Mammo neg 2008.   GERD (gastroesophageal reflux disease)    History of double vision    Ophthalmologist, Dr. Herbert Deaner, is further evaluating this with MRI  orbits and limited brain (myesthenia gravis testing neg 07/2010)   History of home oxygen therapy    uses 2 liters at hs   Hypertension    EKG 03/2010 normal   Hypothyroidism    Hashimoto's, dx'd 1989   Idiopathic peripheral neuropathy 04/2018   Sensory (numb feet) S1 distribution bilat, c/w spinal stenosis sensory neuropathy (no pain). 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  Labs [place], MRI C spine->some spondylosis/stenosis.  UE NCS/EMS:  normal 04/2019.   Lesion of right native kidney 2017   found on CT 02/2015.  F/u MRI 03/31/15 showed it to be smaller and less likely of any significance but repeat CT renal protocol in 72mowas recommended by radiology.  This was done 10/26/15 and showed no interval change in the lesion, but b/c the lesion enhances with contrast, radiol rec'd urol consult (b/c pap renal RCC could not be excluded).  Saw urol 03/06/16--stable on u/s 09/2016 and 09/2017.   OAB (overactive bladder)    Osteoarthritis, hip, bilateral    she is s/p bilat THA as of 02/2018.   Osteoporosis    radius,  T score -2.5->rec'd alendronate 08/2019->plan rpt DEXA 08/2021.   Pneumonia    Postmenopausal atrophic vaginitis    improved with estrace 2 X/week.   Prolapse of vaginal cuff after hysterectomy    10/2018: Dr. FWayne Both Mild colpocele 04/2019 per GYN (no surgery)   Pulmonary nodule    CT chest 12/10, June 2011.  No nodule on CT 06/2010.   Recurrent Clostridium difficile diarrhea    Episode 09/19/2016: resolved with prolonged course of flagyl.  11/2016 recurrence treated with 10d of Dificid (Fidaxomicin) by GI (Dr. MEarlean Shawl.   Recurrent UTI    likely related to pt's atrophic vaginitis.  Urol started vaginal estrace 03/2016.   Rib fracture 09/2018   R 7th, laterally-->sustained when her dog pulled her over and she fell.   RLS (restless legs syndrome)    neuro->gabapentin trial 2020/21   Shingles 02/2014   R abd/flank   Spinal stenosis of lumbar region with neurogenic claudication    2008 MRI (L3-4, L4-5, L5-S1).  +S1 distribution sensory loss bilat    Home Medications Prior to Admission medications   Medication Sig Start Date End Date Taking? Authorizing Provider  albuterol (PROAIR HFA) 108 (90 Base) MCG/ACT inhaler Inhale 2 puffs into the lungs every 4 (four) hours as needed for wheezing or shortness of breath. 01/08/22   SChesley Mires MD  albuterol (PROVENTIL) (2.5 MG/3ML) 0.083% nebulizer solution Take 3 mLs (2.5 mg total) by nebulization every 6 (six) hours as needed for wheezing or shortness of breath. Patient not taking: Reported on 01/31/2022 09/09/21   SChesley Mires MD  alendronate (FOSAMAX) 70 MG tablet TAKE 1 TABLET(70 MG) BY MOUTH 1 TIME A WEEK WITH A FULL GLASS OF WATER AND ON AN EMPTY STOMACH 09/10/20   McGowen, PAdrian Blackwater MD  Artificial Tear Solution (SOOTHE XP) SOLN Place 1 drop into both eyes 2 (two) times daily as needed (irritation).    [provider]  aspirin EC 81 MG tablet Take 81 mg by mouth daily.    [provider]  azelastine (ASTELIN) 0.1 % nasal spray Place  2 sprays into both nostrils 2 (two) times daily. Use in each nostril as directed Patient not taking: Reported on 01/31/2022 05/22/20   MTammi Sou MD  cetirizine (ZYRTEC) 10 MG tablet Take 1 tablet (10 mg total) by mouth daily. 05/22/20   McGowen, PAdrian Blackwater MD  ciprofloxacin (CILOXAN) 0.3 % ophthalmic solution Place 1 drop into the left eye every 4 (four) hours while awake. Administer 1 drop, every 2 hours, while awake, for 2 days. Then 1 drop, every 4 hours, while awake, for the next 5 days. 09/28/21   HIsla Pence MD  clobetasol (TEMOVATE) 0.05 % external solution APPLY TO THE AFFECTED AREA EVERY DAY 03/08/20   [place]ffield, KVida RollerR, PA-C  cycloSPORINE (RESTASIS OP) Apply to eye.  [provider]  diazepam (VALIUM) 5 MG tablet TAKE 1 TABLET BY MOUTH DAILY AS NEEDED FOR ANXIETY OR INSOMNIA 01/10/22   McGowen, Adrian Blackwater, MD  diphenoxylate-atropine (LOMOTIL) 2.5-0.025 MG tablet Take 1 tablet by mouth 3 (three) times daily as needed. 10/24/21   [provider]  fluticasone (CUTIVATE) 0.05 % cream Apply topically 2 (two) times daily. Patient not taking: Reported on 01/31/2022 05/15/21   [name], MD  fluticasone [facility]) 50 MCG/ACT nasal spray Place 2 sprays into both nostrils daily. Patient not taking: Reported on 01/31/2022 05/22/20   [name], MD  Fluticasone-Umeclidin-Vilant (TRELEGY ELLIPTA) 100-62.5-25 MCG/ACT AEPB Inhale 1 puff into the lungs daily. 01/20/22   Chesley Mires, MD  furosemide (LASIX) 20 MG tablet 2 tabs qd alt with 1 tab qd 08/02/21   McGowen, Adrian Blackwater, MD  levothyroxine (SYNTHROID) 100 MCG tablet Take 1 tablet (100 mcg total) by mouth daily. 01/31/22   McGowen, Adrian Blackwater, MD  Lifitegrast [facility]) 5 % SOLN Apply to eye in the morning and at bedtime.    [provider]  Misc. Devices (PULSE OXIMETER) MISC Use to check heart rate and oxygen daily. 11/18/21   McGowen, Adrian Blackwater, MD  nitrofurantoin (MACRODANTIN) 50 MG capsule TAKE 1 CAPSULE(50  MG) BY MOUTH AT BEDTIME 07/03/21   McGowen, Adrian Blackwater, MD  Probiotic Product (PROBIOTIC DAILY PO) Take 1 capsule by mouth daily.     [provider]  roflumilast (DALIRESP) 500 MCG TABS tablet Take 1 tablet (500 mcg total) by mouth daily. 05/31/20   Martyn Ehrich, NP  spironolactone (ALDACTONE) 25 MG tablet 1.5 tab po bid 01/31/22   McGowen, Adrian Blackwater, MD  triamcinolone cream (KENALOG) 0.1 % Apply 1 application topically 2 (two) times daily. Patient not taking: Reported on 01/31/2022 08/17/20   Howard Pouch A, DO  venlafaxine (EFFEXOR) 50 MG tablet Take 1 tablet (50 mg total) by mouth 2 (two) times daily. 03/03/21   McGowen, Adrian Blackwater, MD  Vibegron (GEMTESA) 75 MG TABS Take 1 capsule by mouth daily. Patient not taking: Reported on 01/31/2022 11/23/20   Cleon Gustin, MD  VISINE 0.05 % ophthalmic solution 1 drop  as needed 12/14/19   [provider]  vitamin B-12 (CYANOCOBALAMIN) 100 MCG tablet Take 100 mcg by mouth daily.    [provider]  VITAMIN D PO Take by mouth.    [provider]      Allergies    Losartan, Penicillins, Cefixime [kdc:cefixime], Gabapentin, Indocin [indomethacin], Singulair [montelukast sodium], Sulfa antibiotics, Ace inhibitors, and Statins    Review of Systems   Review of Systems  Respiratory:  Positive for shortness of breath.     Physical Exam Updated Vital Signs BP (!) 140/62   Pulse 85   Temp 98.2 F (36.8 C) (Oral)   Resp (!) 27   Ht 5' (1.524 m)   Wt 54.4 kg   SpO2 97%   BMI 23.44 kg/m  Physical Exam Vitals and nursing note reviewed.  Cardiovascular:     Rate and Rhythm: Regular rhythm.  Pulmonary:     Comments: Diffuse wheezes and prolonged expirations. Chest:     Chest wall: No tenderness.  Abdominal:     Tenderness: There is no abdominal tenderness.  Musculoskeletal:     Right lower leg: No edema.     Left lower leg: No edema.  Skin:    Capillary Refill: Capillary refill takes less than 2  seconds.  Neurological:  Mental Status: She is alert and oriented to person, place, and time.     ED Results / Procedures / Treatments   Labs (all labs ordered are listed, but only abnormal results are displayed) Labs Reviewed  RESP PANEL BY RT-PCR (RSV, FLU A&B, COVID)  [id number] - Abnormal; Notable for the following components:      Result Value   Influenza A by PCR POSITIVE (*)    All other components within normal limits  BASIC METABOLIC PANEL - Abnormal; Notable for the following components:   Glucose, Bld 112 (*)    All other components within normal limits  CBC WITH DIFFERENTIAL/PLATELET - Abnormal; Notable for the following components:   WBC 12.1 (*)    Neutro Abs 8.6 (*)    Abs Immature Granulocytes 0.08 (*)    All other components within normal limits    EKG None  Radiology DG Chest Portable 1 View  Result Date: 02/21/2022 CLINICAL DATA:  Shortness of breath. EXAM: PORTABLE CHEST 1 VIEW COMPARISON:  10/29/2021. FINDINGS: 1613 hours. Bibasilar opacities may reflect aspiration or atelectasis. Stable cardiac and mediastinal contours with atherosclerotic calcifications and tortuosity of the aorta, accounting for patient rotation. Chronic right rib fractures again noted. IMPRESSION: Bibasilar airspace opacities possibly reflecting aspiration or atelectasis. Aortic Atherosclerosis (ICD10-I70.0). Electronically Signed   By: Emmit Alexanders M.D.   On: 02/21/2022 16:24    Procedures Procedures    Medications Ordered in ED Medications - No data to display  ED Course/ Medical Decision Making/ A&P                             Medical Decision Making Amount and/or Complexity of Data Reviewed Labs: ordered. Radiology: ordered.  Risk Decision regarding hospitalization.   Patient with shortness of breath and cough.  History of COPD.  On oxygen as needed although there are some notes mention being on oxygen 24/7.  Had sats of 79% in the office.  Now doing somewhat better.   Will get x-ray and basic blood work.  Reviewed pulmonary note.  Will like require admission to hospital.  Differential diagnose includes pneumonia/COPD, viral infection. Lab work reassuring.  However is flu positive.  I think likely the cause of hypoxia.  With severe hypoxia she had for think she would benefit from admission to the hospital.  Will discuss with hospitalist.         Final Clinical Impression(s) / ED Diagnoses Final diagnoses:  Influenza    Rx / DC Orders ED Discharge Orders     None         Davonna Belling, MD 02/21/22 1746

## 2022-02-21 NOTE — ED Notes (Signed)
ED TO INPATIENT HANDOFF REPORT  ED Nurse Name and Phone #: [phone]  S Name/Age/Gender Priscilla Cantrell 87 y.o. female Room/Bed: 034C/034C  Code Status   Code Status: Prior  Home/SNF/Other Home Patient oriented to: self, place, time, and situation Is this baseline? Yes   Triage Complete: Triage complete  Chief Complaint Acute on chronic respiratory failure with hypoxia [facility]) [J96.21]  Triage Note Pt BIB EMS from the doctors office. EMS called for respiratory distress Patient given two duonebs, 125 solu-medrol, '1mg'$  of atrovent en route. 79% on [address] at doctors office History of COPD and CHF. Productive cough. 2L [place] at baseline. Wheezing currently.  EMS Vitals 76 HR 98% 180/86   Allergies Allergies  Allergen Reactions   Losartan Other (See Comments)    hyperkalemia   Penicillins Hives, Swelling and Rash    Has patient had a PCN reaction causing immediate rash, facial/tongue/throat swelling, SOB or lightheadedness with hypotension: YES Has patient had a PCN reaction causing severe rash involving mucus membranes or skin necrosis: NO Has patient had a PCN reaction that required hospitalization NO Has patient had a PCN reaction occurring within the last 10 years: NO If all of the above answers are "NO", then may proceed with Cephalosporin use.    Cefixime [Kdc:Cefixime] Other (See Comments)    unspecified   Gabapentin Other (See Comments)    Feels woozy   Indocin [Indomethacin] Other (See Comments)    Painful tongue and throat   Singulair [Montelukast Sodium]     Chest tightness, decreased mental clarity   Sulfa Antibiotics Other (See Comments)    unspecified   Ace Inhibitors Other (See Comments)    cough   Statins Other (See Comments)    Myalgias     Level of Care/Admitting Diagnosis ED Disposition     ED Disposition  Admit   Condition  --   [place] [place] [[id number]]  Level of Care: Med-Surg [16]  May admit patient to  [facility] or [facility] if equivalent level of care is available:: No  Covid Evaluation: Confirmed COVID Negative  Diagnosis: Acute on chronic respiratory failure with hypoxia [facility]) [[id number]]  Admitting Physician: Lenore Cordia [[id number]]  Attending Physician: Lenore Cordia [[id number]]  Certification:: I certify this patient will need inpatient services for at least 2 midnights  Estimated Length of Stay: 2          B Medical/Surgery History Past Medical History:  Diagnosis Date   Allergic rhinitis    with upper airway cough: as of 07/2017 pulm f/u she was instructed to use astelin, flonase, and saline more consistently   BPPV (benign paroxysmal positional vertigo) 03/2018   C. difficile colitis    Chest pain, non-cardiac    Cardiac CT showed no signif obstructive dz, mildly elevated calcium level (Dr. [name]).   Chronic cystitis with hematuria    [name]   Chronic hypoxemic respiratory failure ([place]) 02/02/2015   2L oxygen 24/7   Chronic renal insufficiency, stage 2 (mild)    GFR approx 60   Cold hands    NCS/EMGs normal.  Hand dysesthesias per neuro--reassured   COPD (chronic obstructive pulmonary disease) ([place])    GOLD 4.  spirometry 01/10/09 FEV 0.97(52%), FEV1% 47.  With chronic bronchitis as of 07/2017.   DDD (degenerative disc disease), cervical    1998 MRI C5-6 impingemt (left).  MRI 2021 (neurol) 1 level canal stenosis, 1 level foraminal stenosis--->PT   DDD (degenerative disc disease),  lumbar    Depression with anxiety 04/15/2011   Diarrheal disease Summer 2017   ? 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).  As of 10/05/15 pt reports GI dx'd her with C diff and rx'd more flagyl and she is also on cholestyramine.  As of 02/2016, pt's sx's resolved completely.   Diverticulosis of colon    Procto '97 and colonoscopy 2002   Dysplastic nevus of upper  extremity 04/2014   R tricep ([name])   Edema of both lower extremities due to peripheral venous insufficiency    Varicosities bilat, swelling L>R.  R baker's cyst.  Got vein clinic eval 02/2018->sclerotherapy recommended but since no hemorrhage or ulceration, insurance will not cover the procedure.   Family history of adverse reaction to anesthesia    mother died when patient age 64 receiving anesthesia for thyroid surgery   Fibrocystic breast disease    w/fibroadenoma.  Bx's showed NO atypia (Dr. Margot Chimes).  Mammo neg 2008.   GERD (gastroesophageal reflux disease)    History of double vision    Ophthalmologist, Dr. Herbert Deaner, is further evaluating this with MRI  orbits and limited brain (myesthenia gravis testing neg 07/2010)   History of home oxygen therapy    uses 2 liters at hs   Hypertension    EKG 03/2010 normal   Hypothyroidism    Hashimoto's, dx'd 1989   Idiopathic peripheral neuropathy 04/2018   Sensory (numb feet) S1 distribution bilat, c/w spinal stenosis sensory neuropathy (no pain). 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  Labs [place], MRI C spine->some spondylosis/stenosis.  UE NCS/EMS: normal 04/2019.   Lesion of right native kidney 2017   found on CT 02/2015.  F/u MRI 03/31/15 showed it to be smaller and less likely of any significance but repeat CT renal protocol in 48mowas recommended by radiology.  This was done 10/26/15 and showed no interval change in the lesion, but b/c the lesion enhances with contrast, radiol rec'd urol consult (b/c pap renal RCC could not be excluded).  Saw urol 03/06/16--stable on u/s 09/2016 and 09/2017.   OAB (overactive bladder)    Osteoarthritis, hip, bilateral    she is s/p bilat THA as of 02/2018.   Osteoporosis    radius, T score -2.5->rec'd alendronate 08/2019->plan rpt DEXA 08/2021.   Pneumonia    Postmenopausal atrophic vaginitis    improved with estrace 2 X/week.   Prolapse of vaginal  cuff after hysterectomy    10/2018: Dr. FWayne Both Mild colpocele 04/2019 per GYN (no surgery)   Pulmonary nodule    CT chest 12/10, June 2011.  No nodule on CT 06/2010.   Recurrent Clostridium difficile diarrhea    Episode 09/19/2016: resolved with prolonged course of flagyl.  11/2016 recurrence treated with 10d of Dificid (Fidaxomicin) by GI (Dr. MEarlean Shawl.   Recurrent UTI    likely related to pt's atrophic vaginitis.  Urol started vaginal estrace 03/2016.   Rib fracture 09/2018   R 7th, laterally-->sustained when her dog pulled her over and she fell.   RLS (restless legs syndrome)    neuro->gabapentin trial 2020/21   Shingles 02/2014   R abd/flank   Spinal stenosis of lumbar region with neurogenic claudication    2008 MRI (L3-4, L4-5, L5-S1).  +S1 distribution sensory loss bilat   Past Surgical History:  Procedure Laterality Date   ABDOMINAL HYSTERECTOMY  1978   no BSO per pt--nonmalignant  reasons   APPENDECTOMY     BREAST CYST EXCISION     Last screening mammogram 10/2010 was normal.   CHOLECYSTECTOMY  2001   COLONOSCOPY  04/2005; 12/06/2015   2007 NORMAL.  2017 adenomatous polyp x 1.  Mod sigmoid diverticulosis (Dr. Earlean Shawl).     DEXA  05/18/2017; 08/7019   2019 NORMAL (T-score 0.1).  08/2019->T score -2.5->alendronate started->plan Rpt 08/2021.   HIP CLOSED REDUCTION Right 12/25/2015   Procedure: CLOSED REDUCTION HIP;  Surgeon: Nicholes Stairs, MD;  Location: [facility];  Service: Orthopedics;  Laterality: Right;   NCV/EMS  10/2018   chronic and symmetric sensorimotor axonal polyneuropathy affecting the lower extremities   TONSILLECTOMY     TOTAL HIP ARTHROPLASTY Right 11/01/2014   Procedure: RIGHT TOTAL HIP ARTHROPLASTY;  Surgeon: Latanya Maudlin, MD;  Location: [facility];  Service: Orthopedics;  Laterality: Right;   TOTAL HIP ARTHROPLASTY Left 12/08/2017   Procedure: LEFT TOTAL HIP ARTHROPLASTY ANTERIOR APPROACH;  Surgeon: Paralee Cancel, MD;  Location: [facility];  Service: Orthopedics;   Laterality: Left;  70 mins   TRANSTHORACIC ECHOCARDIOGRAM  08/2014   2016 EF 60-65%, grade I diast dysfxn. 06/21/21 unchanged.   TUBAL LIGATION  1974     A IV Location/Drains/Wounds Patient Lines/Drains/Airways Status     Active Line/Drains/Airways     Name Placement date Placement time Site Days   Peripheral IV 02/21/22 20 G Anterior;Left Forearm 02/21/22  1556  Forearm  less than 1   Incision (Closed) 12/08/17 Hip 12/08/17  1252  -- 1536   Wound / Incision (Open or Dehisced) 12/11/20 Head Right;Upper laceration to right lateral scalp 12/11/20  1317  Head  437   Wound / Incision (Open or Dehisced) 12/11/20 Laceration Hand Posterior;Right 12/11/20  1319  Hand  437            Intake/Output Last 24 hours No intake or output data in the 24 hours ending 02/21/22 1853  Labs/Imaging Results for orders placed or performed during the hospital encounter of 02/21/22 (from the past 48 hour(s))  Resp panel by RT-PCR (RSV, Flu A&B, Covid) Anterior Nasal Swab     Status: Abnormal   Collection Time: 02/21/22  3:59 PM   Specimen: Anterior Nasal Swab  Result Value Ref Range   SARS Coronavirus 2 by RT PCR NEGATIVE NEGATIVE    Comment: (NOTE) SARS-CoV-2 target nucleic acids are NOT DETECTED.  The SARS-CoV-2 RNA is generally detectable in upper respiratory specimens during the acute phase of infection. The lowest concentration of SARS-CoV-2 viral copies this assay can detect is 138 copies/mL. A negative result does not preclude SARS-Cov-2 infection and should not be used as the sole basis for treatment or other patient management decisions. A negative result may occur with  improper specimen collection/handling, submission of specimen other than nasopharyngeal swab, presence of viral mutation(s) within the areas targeted by this assay, and inadequate number of viral copies(<138 copies/mL). A negative result must be combined with clinical observations, patient history, and  epidemiological information. The expected result is Negative.  Fact Sheet for Patients:  EntrepreneurPulse.com.au  Fact Sheet for Healthcare Providers:  IncredibleEmployment.be  This test is no t yet approved or cleared by the [place] FDA and  has been authorized for detection and/or diagnosis of SARS-CoV-2 by FDA under an Emergency Use Authorization (EUA). This EUA will remain  in effect (meaning this test can be used) for the duration of the COVID-19 declaration under Section 564(b)(1) of the Act, 21 U.S.C.section 360bbb-3(b)(1), unless  the authorization is terminated  or revoked sooner.       Influenza A by PCR POSITIVE (A) NEGATIVE   Influenza B by PCR NEGATIVE NEGATIVE    Comment: (NOTE) The Xpert Xpress SARS-CoV-2/FLU/RSV plus assay is intended as an aid in the diagnosis of influenza from Nasopharyngeal swab specimens and should not be used as a sole basis for treatment. Nasal washings and aspirates are unacceptable for Xpert Xpress SARS-CoV-2/FLU/RSV testing.  Fact Sheet for Patients: EntrepreneurPulse.com.au  Fact Sheet for Healthcare Providers: IncredibleEmployment.be  This test is not yet approved or cleared by the [place] FDA and has been authorized for detection and/or diagnosis of SARS-CoV-2 by FDA under an Emergency Use Authorization (EUA). This EUA will remain in effect (meaning this test can be used) for the duration of the COVID-19 declaration under Section 564(b)(1) of the Act, 21 U.S.C. section 360bbb-3(b)(1), unless the authorization is terminated or revoked.     Resp Syncytial Virus by PCR NEGATIVE NEGATIVE    Comment: (NOTE) Fact Sheet for Patients: EntrepreneurPulse.com.au  Fact Sheet for Healthcare Providers: IncredibleEmployment.be  This test is not yet approved or cleared by the [place] FDA and has been authorized for  detection and/or diagnosis of SARS-CoV-2 by FDA under an Emergency Use Authorization (EUA). This EUA will remain in effect (meaning this test can be used) for the duration of the COVID-19 declaration under Section 564(b)(1) of the Act, 21 U.S.C. section 360bbb-3(b)(1), unless the authorization is terminated or revoked.  Performed at [facility], [place] [address], [place], [place] [address]   Basic metabolic panel     Status: Abnormal   Collection Time: 02/21/22  3:59 PM  Result Value Ref Range   Sodium 139 135 - 145 mmol/L   Potassium 4.1 3.5 - 5.1 mmol/L    Comment: HEMOLYSIS AT THIS LEVEL MAY AFFECT RESULT   Chloride 100 98 - 111 mmol/L   CO2 29 22 - 32 mmol/L   Glucose, Bld 112 (H) 70 - 99 mg/dL    Comment: Glucose reference range applies only to samples taken after fasting for at least 8 hours.   BUN 15 8 - 23 mg/dL   Creatinine, Ser 0.88 0.44 - 1.00 mg/dL   Calcium 9.2 8.9 - 10.3 mg/dL   GFR, Estimated >60 >60 mL/min    Comment: (NOTE) Calculated using the CKD-EPI Creatinine Equation (2021)    Anion gap 10 5 - 15    Comment: Performed at [facility] [address], [place], [place] [address]  CBC with Differential     Status: Abnormal   Collection Time: 02/21/22  3:59 PM  Result Value Ref Range   WBC 12.1 (H) 4.0 - 10.5 K/uL   RBC 4.20 3.87 - 5.11 MIL/uL   Hemoglobin 12.5 12.0 - 15.0 g/dL   HCT 39.1 36.0 - 46.0 %   MCV 93.1 80.0 - 100.0 fL   MCH 29.8 26.0 - 34.0 pg   MCHC 32.0 30.0 - 36.0 g/dL   RDW 13.1 11.5 - 15.5 %   Platelets 241 150 - 400 K/uL   nRBC 0.0 0.0 - 0.2 %   Neutrophils Relative % 70 %   Neutro Abs 8.6 (H) 1.7 - 7.7 K/uL   Lymphocytes Relative 21 %   Lymphs Abs 2.5 0.7 - 4.0 K/uL   Monocytes Relative 7 %   Monocytes Absolute 0.8 0.1 - 1.0 K/uL   Eosinophils Relative 1 %   Eosinophils Absolute 0.1 0.0 - 0.5 K/uL  Basophils Relative 0 %   Basophils Absolute 0.0 0.0 - 0.1 K/uL   Immature Granulocytes 1 %   Abs Immature Granulocytes  0.08 (H) 0.00 - 0.07 K/uL    Comment: Performed at [facility], [place] [address] [place], [place] [address]   *Note: Due to a large number of results and/or encounters for the requested time period, some results have not been displayed. A complete set of results can be found in Results Review.   DG Chest Portable 1 View  Result Date: 02/21/2022 CLINICAL DATA:  Shortness of breath. EXAM: PORTABLE CHEST 1 VIEW COMPARISON:  10/29/2021. FINDINGS: 1613 hours. Bibasilar opacities may reflect aspiration or atelectasis. Stable cardiac and mediastinal contours with atherosclerotic calcifications and tortuosity of the aorta, accounting for patient rotation. Chronic right rib fractures again noted. IMPRESSION: Bibasilar airspace opacities possibly reflecting aspiration or atelectasis. Aortic Atherosclerosis (ICD10-I70.0). Electronically Signed   By: Emmit Alexanders M.D.   On: 02/21/2022 16:24    Pending Labs Unresulted Labs (From admission, onward)    None       Vitals/Pain Today's Vitals   02/21/22 1615 02/21/22 1630 02/21/22 1700 02/21/22 1730  BP: (!) 160/91 (!) 163/64 (!) 140/62 130/63  Pulse: 83 82 85 80  Resp: (!) 28 (!) 23 (!) 27 (!) 27  Temp:      TempSrc:      SpO2: 91% 92% 97% 93%  Weight:      Height:      PainSc:        Isolation Precautions Droplet precaution  Medications Medications  oseltamivir (TAMIFLU) capsule 30 mg (has no administration in time range)    Mobility walks with device     Focused Assessments    R Recommendations: See Admitting Provider Note  Report given to:   Additional Notes: [place], Flu positive

## 2022-02-21 NOTE — ED Triage Notes (Signed)
Pt BIB EMS from the doctors office. EMS called for respiratory distress Patient given two duonebs, 125 solu-medrol, '1mg'$  of atrovent en route. 79% on [place] at doctors office History of COPD and CHF. Productive cough. 2L [place] at baseline. Wheezing currently.  EMS Vitals 76 HR 98% 180/86

## 2022-02-21 NOTE — Progress Notes (Signed)
OFFICE VISIT  02/21/2022  CC:   Patient is a 87 y.o. female who presents accompanied by her significant other for shortness of breath.  HPI: She has had gradually progressive shortness of breath and wheezing and cough over the last 2 weeks.  Increasing oxygen needs at home--80s at rest on 4 L, down to 70s with any exertion at all. No fever, no nausea, no vomiting, no diarrhea.  Chest hurts when she coughs but otherwise no chest pain.  No change in lower extremities/no edema. Her boyfriend says that he recently had RSV.  Past Medical History:  Diagnosis Date   Allergic rhinitis    with upper airway cough: as of 07/2017 pulm f/u she was instructed to use astelin, flonase, and saline more consistently   BPPV (benign paroxysmal positional vertigo) 03/2018   C. difficile colitis    Chest pain, non-cardiac    Cardiac CT showed no signif obstructive dz, mildly elevated calcium level (Dr. [name]).   Chronic cystitis with hematuria    [name]   Chronic hypoxemic respiratory failure ([place]) 02/02/2015   2L oxygen 24/7   Chronic renal insufficiency, stage 2 (mild)    GFR approx 60   Cold hands    NCS/EMGs normal.  Hand dysesthesias per neuro--reassured   COPD (chronic obstructive pulmonary disease) ([place])    GOLD 4.  spirometry 01/10/09 FEV 0.97(52%), FEV1% 47.  With chronic bronchitis as of 07/2017.   DDD (degenerative disc disease), cervical    1998 MRI C5-6 impingemt (left).  MRI 2021 (neurol) 1 level canal stenosis, 1 level foraminal stenosis--->PT   DDD (degenerative disc disease), lumbar    Depression with anxiety 04/15/2011   Diarrheal disease Summer 2017   ? 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).  As of 10/05/15 pt reports GI dx'd her with C diff and rx'd more flagyl and she is also on cholestyramine.  As of 02/2016, pt's sx's resolved completely.   Diverticulosis of  colon    Procto '97 and colonoscopy 2002   Dysplastic nevus of upper extremity 04/2014   R tricep ([name])   Edema of both lower extremities due to peripheral venous insufficiency    Varicosities bilat, swelling L>R.  R baker's cyst.  Got vein clinic eval 02/2018->sclerotherapy recommended but since no hemorrhage or ulceration, insurance will not cover the procedure.   Family history of adverse reaction to anesthesia    mother died when patient age 6 receiving anesthesia for thyroid surgery   Fibrocystic breast disease    w/fibroadenoma.  Bx's showed NO atypia (Dr. Margot Chimes).  Mammo neg 2008.   GERD (gastroesophageal reflux disease)    History of double vision    Ophthalmologist, Dr. Herbert Deaner, is further evaluating this with MRI  orbits and limited brain (myesthenia gravis testing neg 07/2010)   History of home oxygen therapy    uses 2 liters at hs   Hypertension    EKG 03/2010 normal   Hypothyroidism    Hashimoto's, dx'd 1989   Idiopathic peripheral neuropathy 04/2018   Sensory (numb feet) S1 distribution bilat, c/w spinal stenosis sensory neuropathy (no pain). 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  Labs Brooks, MRI C spine->some spondylosis/stenosis.  UE NCS/EMS: normal 04/2019.   Lesion of right native kidney 2017   found on CT 02/2015.  F/u MRI 03/31/15 showed it to be smaller and less likely of any significance but  repeat CT renal protocol in [place] recommended by radiology.  This was done 10/26/15 and showed no interval change in the lesion, but b/c the lesion enhances with contrast, radiol rec'd urol consult (b/c pap renal RCC could not be excluded).  Saw urol 03/06/16--stable on u/s 09/2016 and 09/2017.   OAB (overactive bladder)    Osteoarthritis, hip, bilateral    she is s/p bilat THA as of 02/2018.   Osteoporosis    radius, T score -2.5->rec'd alendronate 08/2019->plan rpt DEXA 08/2021.   Pneumonia    Postmenopausal  atrophic vaginitis    improved with estrace 2 X/week.   Prolapse of vaginal cuff after hysterectomy    10/2018: Dr. FWayne Both Mild colpocele 04/2019 per GYN (no surgery)   Pulmonary nodule    CT chest 12/10, June 2011.  No nodule on CT 06/2010.   Recurrent Clostridium difficile diarrhea    Episode 09/19/2016: resolved with prolonged course of flagyl.  11/2016 recurrence treated with 10d of Dificid (Fidaxomicin) by GI (Dr. MEarlean Shawl.   Recurrent UTI    likely related to pt's atrophic vaginitis.  Urol started vaginal estrace 03/2016.   Rib fracture 09/2018   R 7th, laterally-->sustained when her dog pulled her over and she fell.   RLS (restless legs syndrome)    neuro->gabapentin trial 2020/21   Shingles 02/2014   R abd/flank   Spinal stenosis of lumbar region with neurogenic claudication    2008 MRI (L3-4, L4-5, L5-S1).  +S1 distribution sensory loss bilat    Past Surgical History:  Procedure Laterality Date   ABDOMINAL HYSTERECTOMY  1978   no BSO per pt--nonmalignant reasons   APPENDECTOMY     BREAST CYST EXCISION     Last screening mammogram 10/2010 was normal.   CHOLECYSTECTOMY  2001   COLONOSCOPY  04/2005; 12/06/2015   2007 NORMAL.  2017 adenomatous polyp x 1.  Mod sigmoid diverticulosis (Dr. MEarlean Shawl.     DEXA  05/18/2017; 08/7019   2019 NORMAL (T-score 0.1).  08/2019->T score -2.5->alendronate started->plan Rpt 08/2021.   HIP CLOSED REDUCTION Right 12/25/2015   Procedure: CLOSED REDUCTION HIP;  Surgeon: JNicholes Stairs MD;  Location: [facility];  Service: Orthopedics;  Laterality: Right;   NCV/EMS  10/2018   chronic and symmetric sensorimotor axonal polyneuropathy affecting the lower extremities   TONSILLECTOMY     TOTAL HIP ARTHROPLASTY Right 11/01/2014   Procedure: RIGHT TOTAL HIP ARTHROPLASTY;  Surgeon: RLatanya Maudlin MD;  Location: [facility];  Service: Orthopedics;  Laterality: Right;   TOTAL HIP ARTHROPLASTY Left 12/08/2017   Procedure: LEFT TOTAL HIP ARTHROPLASTY ANTERIOR  APPROACH;  Surgeon: OParalee Cancel MD;  Location: [facility];  Service: Orthopedics;  Laterality: Left;  70 mins   TRANSTHORACIC ECHOCARDIOGRAM  08/2014   2016 EF 60-65%, grade I diast dysfxn. 06/21/21 unchanged.   TUBAL LIGATION  1974    Outpatient Medications Prior to Visit  Medication Sig Dispense Refill   albuterol (PROAIR HFA) 108 (90 Base) MCG/ACT inhaler Inhale 2 puffs into the lungs every 4 (four) hours as needed for wheezing or shortness of breath. 8.5 g 1   albuterol (PROVENTIL) (2.5 MG/3ML) 0.083% nebulizer solution Take 3 mLs (2.5 mg total) by nebulization every 6 (six) hours as needed for wheezing or shortness of breath. (Patient not taking: Reported on 01/31/2022) 360 mL 5   alendronate (FOSAMAX) 70 MG tablet TAKE 1 TABLET(70 MG) BY MOUTH 1 TIME A WEEK WITH A FULL GLASS OF WATER AND ON AN EMPTY STOMACH 12 tablet 3  Artificial Tear Solution (SOOTHE XP) SOLN Place 1 drop into both eyes 2 (two) times daily as needed (irritation).     aspirin EC 81 MG tablet Take 81 mg by mouth daily.     azelastine (ASTELIN) 0.1 % nasal spray Place 2 sprays into both nostrils 2 (two) times daily. Use in each nostril as directed (Patient not taking: Reported on 01/31/2022) 30 mL 3   cetirizine (ZYRTEC) 10 MG tablet Take 1 tablet (10 mg total) by mouth daily. 30 tablet 6   ciprofloxacin (CILOXAN) 0.3 % ophthalmic solution Place 1 drop into the left eye every 4 (four) hours while awake. Administer 1 drop, every 2 hours, while awake, for 2 days. Then 1 drop, every 4 hours, while awake, for the next 5 days. 5 mL 0   clobetasol (TEMOVATE) 0.05 % external solution APPLY TO THE AFFECTED AREA EVERY DAY 50 mL 4   cycloSPORINE (RESTASIS OP) Apply to eye.     diazepam (VALIUM) 5 MG tablet TAKE 1 TABLET BY MOUTH DAILY AS NEEDED FOR ANXIETY OR INSOMNIA 30 tablet 3   diphenoxylate-atropine (LOMOTIL) 2.5-0.025 MG tablet Take 1 tablet by mouth 3 (three) times daily as needed.     fluticasone (CUTIVATE) 0.05 % cream Apply  topically 2 (two) times daily. (Patient not taking: Reported on 01/31/2022) 30 g 1   fluticasone (FLONASE) 50 MCG/ACT nasal spray Place 2 sprays into both nostrils daily. (Patient not taking: Reported on 01/31/2022) 16 g 6   Fluticasone-Umeclidin-Vilant (TRELEGY ELLIPTA) 100-62.5-25 MCG/ACT AEPB Inhale 1 puff into the lungs daily. 180 each 3   furosemide (LASIX) 20 MG tablet 2 tabs qd alt with 1 tab qd 90 tablet 1   levothyroxine (SYNTHROID) 100 MCG tablet Take 1 tablet (100 mcg total) by mouth daily. 90 tablet 1   Lifitegrast (XIIDRA) 5 % SOLN Apply to eye in the morning and at bedtime.     Misc. Devices (PULSE OXIMETER) MISC Use to check heart rate and oxygen daily. 1 each 0   nitrofurantoin (MACRODANTIN) 50 MG capsule TAKE 1 CAPSULE(50 MG) BY MOUTH AT BEDTIME 90 capsule 3   Probiotic Product (PROBIOTIC DAILY PO) Take 1 capsule by mouth daily.      roflumilast (DALIRESP) 500 MCG TABS tablet Take 1 tablet (500 mcg total) by mouth daily. 90 tablet 3   spironolactone (ALDACTONE) 25 MG tablet 1.5 tab po bid 270 tablet 0   triamcinolone cream (KENALOG) 0.1 % Apply 1 application topically 2 (two) times daily. (Patient not taking: Reported on 01/31/2022) 45 g 2   venlafaxine (EFFEXOR) 50 MG tablet Take 1 tablet (50 mg total) by mouth 2 (two) times daily. 180 tablet 3   Vibegron (GEMTESA) 75 MG TABS Take 1 capsule by mouth daily. (Patient not taking: Reported on 01/31/2022) 30 tablet 11   VISINE 0.05 % ophthalmic solution 1 drop  as needed     vitamin B-12 (CYANOCOBALAMIN) 100 MCG tablet Take 100 mcg by mouth daily.     VITAMIN D PO Take by mouth.     No facility-administered medications prior to visit.    Allergies  Allergen Reactions   Losartan Other (See Comments)    hyperkalemia   Penicillins Hives, Swelling and Rash    Has patient had a PCN reaction causing immediate rash, facial/tongue/throat swelling, SOB or lightheadedness with hypotension: YES Has patient had a PCN reaction causing  severe rash involving mucus membranes or skin necrosis: NO Has patient had a PCN reaction that required hospitalization NO Has  patient had a PCN reaction occurring within the last 10 years: NO If all of the above answers are "NO", then may proceed with Cephalosporin use.    Cefixime [Kdc:Cefixime] Other (See Comments)    unspecified   Gabapentin Other (See Comments)    Feels woozy   Indocin [Indomethacin] Other (See Comments)    Painful tongue and throat   Singulair [Montelukast Sodium]     Chest tightness, decreased mental clarity   Sulfa Antibiotics Other (See Comments)    unspecified   Ace Inhibitors Other (See Comments)    cough   Statins Other (See Comments)    Myalgias     Review of Systems  As per HPI  PE:    02/21/2022    2:57 PM 01/31/2022    3:00 PM 12/18/2021   12:02 PM  Vitals with BMI  Height  '5\' 0"'$  '5\' 0"'$   Weight  121 lbs 13 oz 125 lbs 10 oz  BMI  09.32 35.57  Systolic 322 025 427  Diastolic 90 61 60  Pulse 81 70 66     Physical Exam  General: Alert and attentive but appears acutely ill.  She is in mild respiratory distress. Cardiovascular exam shows a regular rhythm and rate with no murmur or rub.  She has diffuse inspiratory and expiratory wheezing with diminished aeration bilaterally and prolonged expiratory phase. Extremities show no pitting edema.  LABS:  Last CBC Lab Results  Component Value Date   WBC 21.2 (H) 10/29/2021   HGB 13.2 10/29/2021   HCT 40.1 10/29/2021   MCV 88.3 10/29/2021   MCH 29.1 10/29/2021   RDW 14.7 10/29/2021   PLT 222 07/27/7626   Last metabolic panel Lab Results  Component Value Date   GLUCOSE 95 10/29/2021   NA 136 10/29/2021   K 3.8 10/29/2021   CL 103 10/29/2021   CO2 25 10/29/2021   BUN 23 10/29/2021   CREATININE 1.00 10/29/2021   GFRNONAA 56 (L) 10/29/2021   CALCIUM 9.0 10/29/2021   PHOS 3.8 01/31/2013   PROT 6.1 (L) 10/29/2021   ALBUMIN 3.1 (L) 10/29/2021   BILITOT 0.4 10/29/2021   ALKPHOS 58  10/29/2021   AST 18 10/29/2021   ALT 42 10/29/2021   ANIONGAP 8 10/29/2021   Last hemoglobin A1c Lab Results  Component Value Date   HGBA1C 5.7 10/29/2011   Last thyroid functions Lab Results  Component Value Date   TSH 6.73 (H) 07/03/2021   T3TOTAL 97.2 07/11/2010   T4TOTAL 12.4 07/11/2010   IMPRESSION AND PLAN:  Acute-on-chronic hypoxic respiratory failure. Suspect pneumonia superimposed on her COPD. DuoNeb administered in the office today with minimal improvement. Oxygen saturation 82-85 on 4 L nasal cannula.  EMS called and patient transported to the emergency department.  An After Visit Summary was printed and given to the patient.  FOLLOW UP: No follow-ups on file.  Signed:  Crissie Sickles, MD           02/21/2022

## 2022-02-21 NOTE — H&P (Signed)
History and Physical    Priscilla Cantrell EXB:[id number] DOB: December 28, 1935 DOA: 02/21/2022  PCP: [name], MD  Patient coming from: Home  I have personally briefly reviewed patient's old medical records in [place]  Chief Complaint: Dyspnea  HPI: Priscilla Cantrell is a 87 y.o. female with medical history significant for COPD, chronic respiratory failure with hypoxia on 2 L O2 via [place], chronic lower extremity edema, depression/anxiety who presented to the ED for evaluation of dyspnea.  Patient has had progressive shortness of breath and wheezing with frequent cough for the last 2 weeks.  She has had increased oxygen needs at home from her usual 2 L up to 4 L via [place].  SpO2 has been down to the 80s at rest and into the 70s with exertion at home while on 4 L.  She has had generalized fatigue and worsening exertional dyspnea.  She denies fevers, chills, diaphoresis, chest pain, abdominal pain.  She was seen by her PCP in clinic today and noted to have SpO2 82-95% on 4 L via [place].  EMS were called and she was sent to the ED for further evaluation and management.  ED Course  Labs/Imaging on admission: I have personally reviewed following labs and imaging studies.  Initial vitals showed BP 170/94, pulse 83, RR 18, temp 98.2 F, SpO2 100% on NRB.  Labs show WBC 12.1, hemoglobin 12.5, platelets 241,000, sodium 139, potassium 4.1, bicarb 29, BUN 15, creatinine 0.88, serum glucose 112.  Influenza A is positive.  COVID and RSV negative.  Portable chest x-ray shows bibasilar airspace opacities.  The hospitalist service was consulted to admit for further evaluation and management.  Review of Systems: All systems reviewed and are negative except as documented in history of present illness above.   Past Medical History:  Diagnosis Date   Allergic rhinitis    with upper airway cough: as of 07/2017 pulm f/u she was instructed to use astelin, flonase, and saline more consistently   BPPV  (benign paroxysmal positional vertigo) 03/2018   C. difficile colitis    Chest pain, non-cardiac    Cardiac CT showed no signif obstructive dz, mildly elevated calcium level (Dr. [name]).   Chronic cystitis with hematuria    [name]   Chronic hypoxemic respiratory failure ([place]) 02/02/2015   2L oxygen 24/7   Chronic renal insufficiency, stage 2 (mild)    GFR approx 60   Cold hands    NCS/EMGs normal.  Hand dysesthesias per neuro--reassured   COPD (chronic obstructive pulmonary disease) ([place])    GOLD 4.  spirometry 01/10/09 FEV 0.97(52%), FEV1% 47.  With chronic bronchitis as of 07/2017.   DDD (degenerative disc disease), cervical    1998 MRI C5-6 impingemt (left).  MRI 2021 (neurol) 1 level canal stenosis, 1 level foraminal stenosis--->PT   DDD (degenerative disc disease), lumbar    Depression with anxiety 04/15/2011   Diarrheal disease Summer 2017   ? 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).  As of 10/05/15 pt reports GI dx'd her with C diff and rx'd more flagyl and she is also on cholestyramine.  As of 02/2016, pt's sx's resolved completely.   Diverticulosis of colon    Procto '97 and colonoscopy 2002   Dysplastic nevus of upper extremity 04/2014   R tricep ([name])   Edema of both lower extremities due to peripheral venous insufficiency    Varicosities bilat,  swelling L>R.  R baker's cyst.  Got vein clinic eval 02/2018->sclerotherapy recommended but since no hemorrhage or ulceration, insurance will not cover the procedure.   Family history of adverse reaction to anesthesia    mother died when patient age 77 receiving anesthesia for thyroid surgery   Fibrocystic breast disease    w/fibroadenoma.  Bx's showed NO atypia (Dr. Margot Chimes).  Mammo neg 2008.   GERD (gastroesophageal reflux disease)    History of double vision    Ophthalmologist, Dr. Herbert Deaner, is further  evaluating this with MRI  orbits and limited brain (myesthenia gravis testing neg 07/2010)   History of home oxygen therapy    uses 2 liters at hs   Hypertension    EKG 03/2010 normal   Hypothyroidism    Hashimoto's, dx'd 1989   Idiopathic peripheral neuropathy 04/2018   Sensory (numb feet) S1 distribution bilat, c/w spinal stenosis sensory neuropathy (no pain). 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  Labs [place], MRI C spine->some spondylosis/stenosis.  UE NCS/EMS: normal 04/2019.   Lesion of right native kidney 2017   found on CT 02/2015.  F/u MRI 03/31/15 showed it to be smaller and less likely of any significance but repeat CT renal protocol in [place] recommended by radiology.  This was done 10/26/15 and showed no interval change in the lesion, but b/c the lesion enhances with contrast, radiol rec'd urol consult (b/c pap renal RCC could not be excluded).  Saw urol 03/06/16--stable on u/s 09/2016 and 09/2017.   OAB (overactive bladder)    Osteoarthritis, hip, bilateral    she is s/p bilat THA as of 02/2018.   Osteoporosis    radius, T score -2.5->rec'd alendronate 08/2019->plan rpt DEXA 08/2021.   Pneumonia    Postmenopausal atrophic vaginitis    improved with estrace 2 X/week.   Prolapse of vaginal cuff after hysterectomy    10/2018: Dr. FWayne Both Mild colpocele 04/2019 per GYN (no surgery)   Pulmonary nodule    CT chest 12/10, June 2011.  No nodule on CT 06/2010.   Recurrent Clostridium difficile diarrhea    Episode 09/19/2016: resolved with prolonged course of flagyl.  11/2016 recurrence treated with 10d of Dificid (Fidaxomicin) by GI (Dr. MEarlean Shawl.   Recurrent UTI    likely related to pt's atrophic vaginitis.  Urol started vaginal estrace 03/2016.   Rib fracture 09/2018   R 7th, laterally-->sustained when her dog pulled her over and she fell.   RLS (restless legs syndrome)    neuro->gabapentin trial 2020/21   Shingles 02/2014   R  abd/flank   Spinal stenosis of lumbar region with neurogenic claudication    2008 MRI (L3-4, L4-5, L5-S1).  +S1 distribution sensory loss bilat    Past Surgical History:  Procedure Laterality Date   ABDOMINAL HYSTERECTOMY  1978   no BSO per pt--nonmalignant reasons   APPENDECTOMY     BREAST CYST EXCISION     Last screening mammogram 10/2010 was normal.   CHOLECYSTECTOMY  2001   COLONOSCOPY  04/2005; 12/06/2015   2007 NORMAL.  2017 adenomatous polyp x 1.  Mod sigmoid diverticulosis (Dr. MEarlean Shawl.     DEXA  05/18/2017; 08/7019   2019 NORMAL (T-score 0.1).  08/2019->T score -2.5->alendronate started->plan Rpt 08/2021.   HIP CLOSED REDUCTION Right 12/25/2015   Procedure: CLOSED REDUCTION HIP;  Surgeon: JNicholes Stairs MD;  Location: [facility];  Service: Orthopedics;  Laterality: Right;   NCV/EMS  10/2018   chronic and symmetric sensorimotor axonal  polyneuropathy affecting the lower extremities   TONSILLECTOMY     TOTAL HIP ARTHROPLASTY Right 11/01/2014   Procedure: RIGHT TOTAL HIP ARTHROPLASTY;  Surgeon: Latanya Maudlin, MD;  Location: [facility];  Service: Orthopedics;  Laterality: Right;   TOTAL HIP ARTHROPLASTY Left 12/08/2017   Procedure: LEFT TOTAL HIP ARTHROPLASTY ANTERIOR APPROACH;  Surgeon: Paralee Cancel, MD;  Location: [facility];  Service: Orthopedics;  Laterality: Left;  70 mins   TRANSTHORACIC ECHOCARDIOGRAM  08/2014   2016 EF 60-65%, grade I diast dysfxn. 06/21/21 unchanged.   TUBAL LIGATION  1974    Social History:  reports that she quit smoking about 32 years ago. Her smoking use included cigarettes. She started smoking about 66 years ago. She has a 51.00 pack-year smoking history. She has never used smokeless tobacco. She reports current alcohol use. She reports that she does not use drugs.  Allergies  Allergen Reactions   Losartan Other (See Comments)    hyperkalemia   Penicillins Hives, Swelling and Rash    Has patient had a PCN reaction causing immediate rash,  facial/tongue/throat swelling, SOB or lightheadedness with hypotension: YES Has patient had a PCN reaction causing severe rash involving mucus membranes or skin necrosis: NO Has patient had a PCN reaction that required hospitalization NO Has patient had a PCN reaction occurring within the last 10 years: NO If all of the above answers are "NO", then may proceed with Cephalosporin use.    Cefixime [Kdc:Cefixime] Other (See Comments)    unspecified   Gabapentin Other (See Comments)    Feels woozy   Indocin [Indomethacin] Other (See Comments)    Painful tongue and throat   Singulair [Montelukast Sodium]     Chest tightness, decreased mental clarity   Sulfa Antibiotics Other (See Comments)    unspecified   Ace Inhibitors Other (See Comments)    cough   Statins Other (See Comments)    Myalgias     Family History  Problem Relation Age of Onset   Goiter Mother    Psoriasis Father        od liver   Cirrhosis Father    Diabetes Daughter        Type 2   Stroke Maternal Grandfather    Stroke Paternal Grandmother    Hypertension Paternal Grandmother    Hernia Paternal Grandmother    Cancer Paternal Grandfather        lung   Cancer Maternal Aunt        cancer of the stomach     Prior to Admission medications   Medication Sig Start Date End Date Taking? Authorizing Provider  albuterol (PROAIR HFA) 108 (90 Base) MCG/ACT inhaler Inhale 2 puffs into the lungs every 4 (four) hours as needed for wheezing or shortness of breath. 01/08/22   Chesley Mires, MD  albuterol (PROVENTIL) (2.5 MG/3ML) 0.083% nebulizer solution Take 3 mLs (2.5 mg total) by nebulization every 6 (six) hours as needed for wheezing or shortness of breath. Patient not taking: Reported on 01/31/2022 09/09/21   Chesley Mires, MD  alendronate (FOSAMAX) 70 MG tablet TAKE 1 TABLET(70 MG) BY MOUTH 1 TIME A WEEK WITH A FULL GLASS OF WATER AND ON AN EMPTY STOMACH 09/10/20   McGowen, Adrian Blackwater, MD  Artificial Tear Solution (SOOTHE XP)  SOLN Place 1 drop into both eyes 2 (two) times daily as needed (irritation).    [provider]  aspirin EC 81 MG tablet Take 81 mg by mouth daily.    [provider]  azelastine (ASTELIN) 0.1 % nasal spray Place 2 sprays into both nostrils 2 (two) times daily. Use in each nostril as directed Patient not taking: Reported on 01/31/2022 05/22/20   [name], MD  cetirizine (ZYRTEC) 10 MG tablet Take 1 tablet (10 mg total) by mouth daily. 05/22/20   McGowen, Adrian Blackwater, MD  ciprofloxacin (CILOXAN) 0.3 % ophthalmic solution Place 1 drop into the left eye every 4 (four) hours while awake. Administer 1 drop, every 2 hours, while awake, for 2 days. Then 1 drop, every 4 hours, while awake, for the next 5 days. 09/28/21   Isla Pence, MD  clobetasol (TEMOVATE) 0.05 % external solution APPLY TO THE AFFECTED AREA EVERY DAY 03/08/20   [place], Vida Roller R, PA-C  cycloSPORINE (RESTASIS OP) Apply to eye.    [provider]  diazepam (VALIUM) 5 MG tablet TAKE 1 TABLET BY MOUTH DAILY AS NEEDED FOR ANXIETY OR INSOMNIA 01/10/22   McGowen, Adrian Blackwater, MD  diphenoxylate-atropine (LOMOTIL) 2.5-0.025 MG tablet Take 1 tablet by mouth 3 (three) times daily as needed. 10/24/21   [provider]  fluticasone (CUTIVATE) 0.05 % cream Apply topically 2 (two) times daily. Patient not taking: Reported on 01/31/2022 05/15/21   [name], MD  fluticasone [facility]) 50 MCG/ACT nasal spray Place 2 sprays into both nostrils daily. Patient not taking: Reported on 01/31/2022 05/22/20   [name], MD  Fluticasone-Umeclidin-Vilant (TRELEGY ELLIPTA) 100-62.5-25 MCG/ACT AEPB Inhale 1 puff into the lungs daily. 01/20/22   Chesley Mires, MD  furosemide (LASIX) 20 MG tablet 2 tabs qd alt with 1 tab qd 08/02/21   McGowen, Adrian Blackwater, MD  levothyroxine (SYNTHROID) 100 MCG tablet Take 1 tablet (100 mcg total) by mouth daily. 01/31/22   McGowen, Adrian Blackwater, MD  Lifitegrast [facility]) 5 % SOLN Apply to eye in  the morning and at bedtime.    [provider]  Misc. Devices (PULSE OXIMETER) MISC Use to check heart rate and oxygen daily. 11/18/21   McGowen, Adrian Blackwater, MD  nitrofurantoin (MACRODANTIN) 50 MG capsule TAKE 1 CAPSULE(50 MG) BY MOUTH AT BEDTIME 07/03/21   McGowen, Adrian Blackwater, MD  Probiotic Product (PROBIOTIC DAILY PO) Take 1 capsule by mouth daily.     [provider]  roflumilast (DALIRESP) 500 MCG TABS tablet Take 1 tablet (500 mcg total) by mouth daily. 05/31/20   Martyn Ehrich, NP  spironolactone (ALDACTONE) 25 MG tablet 1.5 tab po bid 01/31/22   McGowen, Adrian Blackwater, MD  triamcinolone cream (KENALOG) 0.1 % Apply 1 application topically 2 (two) times daily. Patient not taking: Reported on 01/31/2022 08/17/20   Howard Pouch A, DO  venlafaxine (EFFEXOR) 50 MG tablet Take 1 tablet (50 mg total) by mouth 2 (two) times daily. 03/03/21   McGowen, Adrian Blackwater, MD  Vibegron (GEMTESA) 75 MG TABS Take 1 capsule by mouth daily. Patient not taking: Reported on 01/31/2022 11/23/20   Cleon Gustin, MD  VISINE 0.05 % ophthalmic solution 1 drop  as needed 12/14/19   [provider]  vitamin B-12 (CYANOCOBALAMIN) 100 MCG tablet Take 100 mcg by mouth daily.    [provider]  VITAMIN D PO Take by mouth.    [provider]    Physical Exam: Vitals:   02/21/22 1615 02/21/22 1630 02/21/22 1700 02/21/22 1730  BP: (!) 160/91 (!) 163/64 (!) 140/62 130/63  Pulse: 83 82 85 80  Resp: (!) 28 (!) 23 (!) 27 (!) 27  Temp:  TempSrc:      SpO2: 91% 92% 97% 93%  Weight:      Height:       Constitutional: Resting in bed, appears fatigued but in NAD, calm, comfortable Eyes: EOMI, lids and conjunctivae normal ENMT: Mucous membranes are moist. Posterior pharynx clear of any exudate or lesions.Normal dentition.  Neck: normal, supple, no masses. Respiratory: End expiratory wheezing bilaterally.  Frequent cough. Normal respiratory effort on 3 L O2 via [place]. No accessory  muscle use.  Cardiovascular: Regular rate and rhythm, no murmurs / rubs / gallops. No extremity edema. 2+ pedal pulses. Abdomen: no tenderness, no masses palpated.  Musculoskeletal: no clubbing / cyanosis. No joint deformity upper and lower extremities. Good ROM, no contractures. Normal muscle tone.  Skin: no rashes, lesions, ulcers. No induration Neurologic: Sensation intact. Strength 5/5 in all 4.  Psychiatric: Normal judgment and insight. Alert and oriented x 3. Normal mood.   EKG: Personally reviewed. Sinus rhythm, rate 81, RBBB, PACs present.  Previous EKG showed sinus bradycardia.  Assessment/Plan Principal Problem:   Acute on chronic respiratory failure with hypoxia (HCC) Active Problems:   Influenza A   COPD GOLD III B   Depression with anxiety   Essential hypertension   Priscilla Cantrell is a 87 y.o. female with medical history significant for COPD, chronic respiratory failure with hypoxia on 2 L O2 via [place], chronic lower extremity edema, depression/anxiety who is admitted with acute on chronic hypoxic respiratory failure due to COPD exacerbation in setting of influenza A.  Assessment and Plan: Acute on chronic hypoxic respiratory failure due to COPD exacerbation in setting of influenza A infection: SpO2 dropping to 82-85% on 4 L via [place] at home.  Usually wears 2 L [place].  Influenza A is positive.  CXR with bibasilar airspace opacities. -Start Tamiflu -Start IV Solu-Medrol 40 mg twice daily -Start scheduled DuoNebs with albuterol as needed -Continue Trelegy -Incentive spirometer, flutter valve, Mucinex -Continue supplemental O2 and wean to home 2 L as able -Start azithromycin  Hypertension: Continue spironolactone Hypothyroidism: Continue Synthroid Depression/anxiety: Continue Effexor and home Valium as needed    DVT prophylaxis: enoxaparin (LOVENOX) injection 30 mg Start: 02/21/22 1930 Code Status: Full code, confirmed with patient on admission Family Communication:  Discussed with patient, she has discussed with her significant other Disposition Plan: From home and likely discharge to home pending clinical progress Consults called: None Severity of Illness: The appropriate patient status for this patient is INPATIENT. Inpatient status is judged to be reasonable and necessary in order to provide the required intensity of service to ensure the patient's safety. The patient's presenting symptoms, physical exam findings, and initial radiographic and laboratory data in the context of their chronic comorbidities is felt to place them at high risk for further clinical deterioration. Furthermore, it is not anticipated that the patient will be medically stable for discharge from the hospital within 2 midnights of admission.   * I certify that at the point of admission it is my clinical judgment that the patient will require inpatient hospital care spanning beyond 2 midnights from the point of admission due to high intensity of service, high risk for further deterioration and high frequency of surveillance required.Zada Finders MD [facility]  If 7PM-7AM, please contact night-coverage [email or web address]  02/21/2022, 7:22 PM

## 2022-02-22 DIAGNOSIS — A419 Sepsis, unspecified organism: Secondary | ICD-10-CM | POA: Insufficient documentation

## 2022-02-22 LAB — BASIC METABOLIC PANEL
Anion gap: 8 (ref 5–15)
BUN: 13 mg/dL (ref 8–23)
CO2: 28 mmol/L (ref 22–32)
Calcium: 8.6 mg/dL — ABNORMAL LOW (ref 8.9–10.3)
Chloride: 102 mmol/L (ref 98–111)
Creatinine, Ser: 0.71 mg/dL (ref 0.44–1.00)
GFR, Estimated: >60 mL/min (ref >60)
Glucose, Bld: 154 mg/dL — ABNORMAL HIGH (ref 70–99)
Potassium: 4.3 mmol/L (ref 3.5–5.1)
Sodium: 138 mmol/L (ref 135–145)

## 2022-02-22 LAB — PROCALCITONIN: Procalcitonin: <0.1 ng/mL

## 2022-02-22 LAB — CBC
HCT: 35.2 % — ABNORMAL LOW (ref 36.0–46.0)
Hemoglobin: 12 g/dL (ref 12.0–15.0)
MCH: 30.5 pg (ref 26.0–34.0)
MCHC: 34.1 g/dL (ref 30.0–36.0)
MCV: 89.3 fL (ref 80.0–100.0)
Platelets: 242 10*3/uL (ref 150–400)
RBC: 3.94 MIL/uL (ref 3.87–5.11)
RDW: 12.9 % (ref 11.5–15.5)
WBC: 12.7 10*3/uL — ABNORMAL HIGH (ref 4.0–10.5)
nRBC: 0 % (ref 0.0–0.2)

## 2022-02-22 LAB — STREP PNEUMONIAE URINARY ANTIGEN: Strep Pneumo Urinary Antigen: NEGATIVE

## 2022-02-22 MED ORDER — POLYVINYL ALCOHOL 1.4 % OP SOLN: 1.0000 [drp] | Freq: Two times a day (BID) | OPHTHALMIC | Status: DC | PRN

## 2022-02-22 MED ORDER — CIPROFLOXACIN HCL 0.3 % OP SOLN
1.0000 [drp] | OPHTHALMIC | Status: DC
  Administered 2022-02-22 – 2022-02-24 (×10): 1 [drp] via OPHTHALMIC
  Filled 2022-02-22: qty 2.5

## 2022-02-22 MED ORDER — ASPIRIN 81 MG PO TBEC
81.0000 mg | DELAYED_RELEASE_TABLET | Freq: Every day | ORAL | Status: DC
  Administered 2022-02-22 – 2022-02-24 (×3): 81 mg via ORAL
  Filled 2022-02-22 (×3): qty 1

## 2022-02-22 MED ORDER — VITAMIN B-12 100 MCG PO TABS
100.0000 ug | ORAL_TABLET | Freq: Every day | ORAL | Status: DC
  Administered 2022-02-22 – 2022-02-24 (×3): 100 ug via ORAL
  Filled 2022-02-22 (×3): qty 1

## 2022-02-22 MED ORDER — ALBUTEROL SULFATE HFA 108 (90 BASE) MCG/ACT IN AERS: 2.0000 | INHALATION_SPRAY | RESPIRATORY_TRACT | Status: DC | PRN

## 2022-02-22 MED ORDER — NITROFURANTOIN MACROCRYSTAL 50 MG PO CAPS
50.0000 mg | ORAL_CAPSULE | Freq: Every day | ORAL | Status: DC
  Administered 2022-02-22 – 2022-02-23 (×2): 50 mg via ORAL
  Filled 2022-02-22 (×2): qty 1

## 2022-02-22 MED ORDER — SALINE SPRAY 0.65 % NA SOLN
1.0000 | NASAL | Status: DC | PRN
  Filled 2022-02-22: qty 44

## 2022-02-22 MED ORDER — FUROSEMIDE 20 MG PO TABS
20.0000 mg | ORAL_TABLET | Freq: Every day | ORAL | Status: DC
  Administered 2022-02-22 – 2022-02-24 (×3): 20 mg via ORAL
  Filled 2022-02-22 (×3): qty 1

## 2022-02-22 MED ORDER — HYDRALAZINE HCL 20 MG/ML IJ SOLN
10.0000 mg | Freq: Four times a day (QID) | INTRAMUSCULAR | Status: DC | PRN
  Administered 2022-02-22: 10 mg via INTRAVENOUS
  Filled 2022-02-22: qty 1

## 2022-02-22 NOTE — Progress Notes (Signed)
PROGRESS NOTE    Priscilla Cantrell  KYH:[id number] DOB: Jun 07, 1935 DOA: 02/21/2022 PCP: [name], MD   Brief Narrative:  HPI: Priscilla Cantrell is a 87 y.o. female with medical history significant for COPD, chronic respiratory failure with hypoxia on 2 L O2 via [place], chronic lower extremity edema, depression/anxiety who presented to the ED for evaluation of dyspnea.   Patient has had progressive shortness of breath and wheezing with frequent cough for the last 2 weeks.  She has had increased oxygen needs at home from her usual 2 L up to 4 L via [place].  SpO2 has been down to the 80s at rest and into the 70s with exertion at home while on 4 L.  She has had generalized fatigue and worsening exertional dyspnea.  She denies fevers, chills, diaphoresis, chest pain, abdominal pain.   She was seen by her PCP in clinic today and noted to have SpO2 82-95% on 4 L via [place].  EMS were called and she was sent to the ED for further evaluation and management.   ED Course  Labs/Imaging on admission: I have personally reviewed following labs and imaging studies.   Initial vitals showed BP 170/94, pulse 83, RR 18, temp 98.2 F, SpO2 100% on NRB.   Labs show WBC 12.1, hemoglobin 12.5, platelets 241,000, sodium 139, potassium 4.1, bicarb 29, BUN 15, creatinine 0.88, serum glucose 112.   Influenza A is positive.  COVID and RSV negative.   Portable chest x-ray shows bibasilar airspace opacities.  The hospitalist service was consulted to admit for further evaluation and management.  Assessment & Plan:   Principal Problem:   Acute on chronic respiratory failure with hypoxia (HCC) Active Problems:   Influenza A   COPD GOLD III B   Depression with anxiety   Essential hypertension  Acute on chronic hypoxic respiratory failure due to COPD exacerbation in setting of sepsis secondary to influenza A infection: Patient meets sepsis criteria based on tachypnea and leukocytosis.  Usually wears 2 L [place].  Influenza A  is positive.  CXR with bibasilar airspace opacities.  Procalcitonin pending, some suspicion of possible bacterial superinfection.  Started on Solu-Medrol, bronchodilators, Trelegy, incentive spirometry, Mucinex, flutter valve as well as azithromycin and Tamiflu, I will continue all of those.  If procalcitonin elevated, may add Rocephin.  Check urine antigen for streptococci and Legionella.  Hypertension: Blood pressure only slightly elevated, continue home dose of spironolactone, add as needed hydralazine.  Hypothyroidism: Continue Synthroid  Depression/anxiety: Continue Effexor and home Valium as needed  DVT prophylaxis: enoxaparin (LOVENOX) injection 40 mg Start: 02/22/22 2000   Code Status: Full Code  Family Communication:  None present at bedside.  Plan of care discussed with patient in length and he/she verbalized understanding and agreed with it.  Status is: Inpatient Remains inpatient appropriate because: Still symptomatic.   Estimated body mass index is 22.95 kg/m as calculated from the following:   Height as of this encounter: 5' (1.524 m).   Weight as of this encounter: 53.3 kg.    Nutritional Assessment: Body mass index is 22.95 kg/m.Marland Kitchen Seen by dietician.  I agree with the assessment and plan as outlined below: Nutrition Status:        . Skin Assessment: I have examined the patient's skin and I agree with the wound assessment as performed by the wound care RN as outlined below:    Consultants:  None  Procedures:  None  Antimicrobials:  Anti-infectives (From admission, onward)  Start     Dose/Rate Route Frequency Ordered Stop   02/22/22 2200  nitrofurantoin (MACRODANTIN) capsule 50 mg       Note to Pharmacy: TAKE 1 CAPSULE(50 MG) BY MOUTH AT BEDTIME     50 mg Oral Daily at bedtime 02/22/22 0738     02/22/22 1930  azithromycin (ZITHROMAX) tablet 500 mg       See Hyperspace for full Linked Orders Report.   500 mg Oral Daily 02/21/22 1915 02/26/22 0959    02/21/22 2200  oseltamivir (TAMIFLU) capsule 75 mg  Status:  Discontinued        75 mg Oral 2 times daily 02/21/22 1825 02/21/22 1832   02/21/22 1915  azithromycin (ZITHROMAX) 500 mg in sodium chloride 0.9 % 250 mL IVPB       See Hyperspace for full Linked Orders Report.   500 mg 250 mL/hr over 60 Minutes Intravenous Every 24 hours 02/21/22 1915 02/22/22 0030   02/21/22 1900  oseltamivir (TAMIFLU) capsule 30 mg        30 mg Oral 2 times daily 02/21/22 1833 02/26/22 2159         Subjective: Patient seen and examined, she still complains of shortness of breath, only slightly better than yesterday.  Objective: Vitals:   02/21/22 2307 02/22/22 0541 02/22/22 0805 02/22/22 0811  BP:  (!) 161/73    Pulse: 78 66    Resp: 20 14    Temp:  97.7 F (36.5 C)    TempSrc:  Oral    SpO2: 94% 94% 95% 95%  Weight:      Height:        Intake/Output Summary (Last 24 hours) at 02/22/2022 1011 Last data filed at 02/22/2022 0636 Gross per 24 hour  Intake --  Output 650 ml  Net -650 ml   Filed Weights   02/21/22 1557 02/21/22 2035  Weight: 54.4 kg 53.3 kg    Examination:  General exam: Appears tired and tachypneic. Respiratory system: Bilateral rhonchi with wheezes, tachypneic. Cardiovascular system: S1 & S2 heard, RRR. No JVD, murmurs, rubs, gallops or clicks. No pedal edema. Gastrointestinal system: Abdomen is nondistended, soft and nontender. No organomegaly or masses felt. Normal bowel sounds heard. Central nervous system: Alert and oriented. No focal neurological deficits. Extremities: Symmetric 5 x 5 power. Skin: No rashes, lesions or ulcers Psychiatry: Judgement and insight appear normal. Mood & affect appropriate.    Data Reviewed: I have personally reviewed following labs and imaging studies  CBC: Recent Labs  Lab 02/21/22 1559 02/22/22 0818  WBC 12.1* 12.7*  NEUTROABS 8.6*  --   HGB 12.5 12.0  HCT 39.1 35.2*  MCV 93.1 89.3  PLT 241 197   Basic Metabolic  Panel: Recent Labs  Lab 02/21/22 1559 02/22/22 0818  NA 139 138  K 4.1 4.3  CL 100 102  CO2 29 28  GLUCOSE 112* 154*  BUN 15 13  CREATININE 0.88 0.71  CALCIUM 9.2 8.6*   GFR: Estimated Creatinine Clearance: 37.6 mL/min (by C-G formula based on SCr of 0.71 mg/dL). Liver Function Tests: No results for input(s): "AST", "ALT", "ALKPHOS", "BILITOT", "PROT", "ALBUMIN" in the last 168 hours. No results for input(s): "LIPASE", "AMYLASE" in the last 168 hours. No results for input(s): "AMMONIA" in the last 168 hours. Coagulation Profile: No results for input(s): "INR", "PROTIME" in the last 168 hours. Cardiac Enzymes: No results for input(s): "CKTOTAL", "CKMB", "CKMBINDEX", "TROPONINI" in the last 168 hours. BNP (last 3 results) No results for input(s): "  PROBNP" in the last 8760 hours. HbA1C: No results for input(s): "HGBA1C" in the last 72 hours. CBG: No results for input(s): "GLUCAP" in the last 168 hours. Lipid Profile: No results for input(s): "CHOL", "HDL", "LDLCALC", "TRIG", "CHOLHDL", "LDLDIRECT" in the last 72 hours. Thyroid Function Tests: No results for input(s): "TSH", "T4TOTAL", "FREET4", "T3FREE", "THYROIDAB" in the last 72 hours. Anemia Panel: No results for input(s): "VITAMINB12", "FOLATE", "FERRITIN", "TIBC", "IRON", "RETICCTPCT" in the last 72 hours. Sepsis Labs: No results for input(s): "PROCALCITON", "LATICACIDVEN" in the last 168 hours.  Recent Results (from the past 240 hour(s))  Resp panel by RT-PCR (RSV, Flu A&B, Covid) Anterior Nasal Swab     Status: Abnormal   Collection Time: 02/21/22  3:59 PM   Specimen: Anterior Nasal Swab  Result Value Ref Range Status   SARS Coronavirus 2 by RT PCR NEGATIVE NEGATIVE Final    Comment: (NOTE) SARS-CoV-2 target nucleic acids are NOT DETECTED.  The SARS-CoV-2 RNA is generally detectable in upper respiratory specimens during the acute phase of infection. The lowest concentration of SARS-CoV-2 viral copies this assay  can detect is 138 copies/mL. A negative result does not preclude SARS-Cov-2 infection and should not be used as the sole basis for treatment or other patient management decisions. A negative result may occur with  improper specimen collection/handling, submission of specimen other than nasopharyngeal swab, presence of viral mutation(s) within the areas targeted by this assay, and inadequate number of viral copies(<138 copies/mL). A negative result must be combined with clinical observations, patient history, and epidemiological information. The expected result is Negative.  Fact Sheet for Patients:  EntrepreneurPulse.com.au  Fact Sheet for Healthcare Providers:  IncredibleEmployment.be  This test is no t yet approved or cleared by the [place] FDA and  has been authorized for detection and/or diagnosis of SARS-CoV-2 by FDA under an Emergency Use Authorization (EUA). This EUA will remain  in effect (meaning this test can be used) for the duration of the COVID-19 declaration under Section 564(b)(1) of the Act, 21 U.S.C.section 360bbb-3(b)(1), unless the authorization is terminated  or revoked sooner.       Influenza A by PCR POSITIVE (A) NEGATIVE Final   Influenza B by PCR NEGATIVE NEGATIVE Final    Comment: (NOTE) The Xpert Xpress SARS-CoV-2/FLU/RSV plus assay is intended as an aid in the diagnosis of influenza from Nasopharyngeal swab specimens and should not be used as a sole basis for treatment. Nasal washings and aspirates are unacceptable for Xpert Xpress SARS-CoV-2/FLU/RSV testing.  Fact Sheet for Patients: EntrepreneurPulse.com.au  Fact Sheet for Healthcare Providers: IncredibleEmployment.be  This test is not yet approved or cleared by the [place] FDA and has been authorized for detection and/or diagnosis of SARS-CoV-2 by FDA under an Emergency Use Authorization (EUA). This EUA will  remain in effect (meaning this test can be used) for the duration of the COVID-19 declaration under Section 564(b)(1) of the Act, 21 U.S.C. section 360bbb-3(b)(1), unless the authorization is terminated or revoked.     Resp Syncytial Virus by PCR NEGATIVE NEGATIVE Final    Comment: (NOTE) Fact Sheet for Patients: EntrepreneurPulse.com.au  Fact Sheet for Healthcare Providers: IncredibleEmployment.be  This test is not yet approved or cleared by the [place] FDA and has been authorized for detection and/or diagnosis of SARS-CoV-2 by FDA under an Emergency Use Authorization (EUA). This EUA will remain in effect (meaning this test can be used) for the duration of the COVID-19 declaration under Section 564(b)(1) of the Act, 21 U.S.C. section 360bbb-3(b)(1),  unless the authorization is terminated or revoked.  Performed at [facility], [place] [address] [place], [place] [address]      Radiology Studies: DG Chest Portable 1 View  Result Date: 02/21/2022 CLINICAL DATA:  Shortness of breath. EXAM: PORTABLE CHEST 1 VIEW COMPARISON:  10/29/2021. FINDINGS: 1613 hours. Bibasilar opacities may reflect aspiration or atelectasis. Stable cardiac and mediastinal contours with atherosclerotic calcifications and tortuosity of the aorta, accounting for patient rotation. Chronic right rib fractures again noted. IMPRESSION: Bibasilar airspace opacities possibly reflecting aspiration or atelectasis. Aortic Atherosclerosis (ICD10-I70.0). Electronically Signed   By: Emmit Alexanders M.D.   On: 02/21/2022 16:24    Scheduled Meds:  aspirin EC  81 mg Oral Daily   azithromycin  500 mg Oral Daily   ciprofloxacin  1 drop Left Eye Q4H while awake   enoxaparin (LOVENOX) injection  40 mg Subcutaneous Q24H   fluticasone furoate-vilanterol  1 puff Inhalation Daily   And   umeclidinium bromide  1 puff Inhalation Daily   furosemide  20 mg Oral Daily   guaiFENesin  600 mg  Oral BID   levothyroxine  100 mcg Oral Q0600   methylPREDNISolone (SOLU-MEDROL) injection  40 mg Intravenous Q12H   nitrofurantoin  50 mg Oral QHS   oseltamivir  30 mg Oral BID   spironolactone  37.5 mg Oral BID   venlafaxine  50 mg Oral BID   vitamin B-12  100 mcg Oral Daily   Continuous Infusions:   LOS: 1 day   Darliss Cheney, MD [facility]  02/22/2022, 10:11 AM   *Please note that this is a verbal dictation therefore any spelling or grammatical errors are due to the "[employer] One" system interpretation.  Please page via [place] and do not message via secure chat for urgent patient care matters. Secure chat can be used for non urgent patient care matters.  How to contact the [facility] Attending or Consulting provider [place] or covering provider during after hours [place], for this patient?  Check the care team in [facility] and look for a) attending/consulting [facility] provider listed and b) the [facility] team listed. Page or secure chat [phone]. Log into [email or web address] and use [place]'s universal password to access. If you do not have the password, please contact the hospital operator. Locate the [facility] provider you are looking for under [facility] and page to a number that you can be directly reached. If you still have difficulty reaching the provider, please page the [facility] (Director on Call) for the Hospitalists listed on amion for assistance.

## 2022-02-23 LAB — CBC WITH DIFFERENTIAL/PLATELET
Abs Immature Granulocytes: 0.12 10*3/uL — ABNORMAL HIGH (ref 0.00–0.07)
Basophils Absolute: 0 10*3/uL (ref 0.0–0.1)
Basophils Relative: 0 %
Eosinophils Absolute: 0 10*3/uL (ref 0.0–0.5)
Eosinophils Relative: 0 %
HCT: 37.8 % (ref 36.0–46.0)
Hemoglobin: 12.3 g/dL (ref 12.0–15.0)
Immature Granulocytes: 1 %
Lymphocytes Relative: 6 %
Lymphs Abs: 1.1 10*3/uL (ref 0.7–4.0)
MCH: 29.9 pg (ref 26.0–34.0)
MCHC: 32.5 g/dL (ref 30.0–36.0)
MCV: 91.7 fL (ref 80.0–100.0)
Monocytes Absolute: 0.5 10*3/uL (ref 0.1–1.0)
Monocytes Relative: 3 %
Neutro Abs: 16.5 10*3/uL — ABNORMAL HIGH (ref 1.7–7.7)
Neutrophils Relative %: 90 %
Platelets: 252 10*3/uL (ref 150–400)
RBC: 4.12 MIL/uL (ref 3.87–5.11)
RDW: 13.2 % (ref 11.5–15.5)
WBC: 18.3 10*3/uL — ABNORMAL HIGH (ref 4.0–10.5)
nRBC: 0 % (ref 0.0–0.2)

## 2022-02-23 LAB — BASIC METABOLIC PANEL
Anion gap: 10 (ref 5–15)
BUN: 23 mg/dL (ref 8–23)
CO2: 26 mmol/L (ref 22–32)
Calcium: 9 mg/dL (ref 8.9–10.3)
Chloride: 102 mmol/L (ref 98–111)
Creatinine, Ser: 0.84 mg/dL (ref 0.44–1.00)
GFR, Estimated: >60 mL/min (ref >60)
Glucose, Bld: 131 mg/dL — ABNORMAL HIGH (ref 70–99)
Potassium: 4.5 mmol/L (ref 3.5–5.1)
Sodium: 138 mmol/L (ref 135–145)

## 2022-02-23 NOTE — Progress Notes (Signed)
PROGRESS NOTE    [name]  KZS:[id number] DOB: Oct 13, 1935 DOA: 02/21/2022 PCP: [name], MD   Brief Narrative:  Priscilla Cantrell is a 87 y.o. female with medical history significant for COPD, chronic respiratory failure with hypoxia on 2 L O2 via [place], chronic lower extremity edema, depression/anxiety who presented to the ED for evaluation of dyspnea and was diagnosed with acute on chronic hypoxic respite failure secondary to COPD exacerbation and influenza A infection.. Portable chest x-ray shows bibasilar airspace opacities procalcitonin negative, arguing against any bacterial infection.  Assessment & Plan:   Principal Problem:   Acute on chronic respiratory failure with hypoxia (HCC) Active Problems:   Influenza A   COPD GOLD III B   Depression with anxiety   Essential hypertension   Sepsis ([place])  Acute on chronic hypoxic respiratory failure due to COPD exacerbation in setting of sepsis secondary to influenza A infection: Patient meets sepsis criteria based on tachypnea and leukocytosis.  Usually wears 2 L [place].  Influenza A is positive.  CXR with bibasilar airspace opacities.  Procalcitonin unremarkable, arguing against bacterial infection.  She does not have wheezing that she had yesterday and lungs are better on exam as well however she still feels short of breath and she is dyspneic when talking and does not feel comfortable going home.  She is currently on 3 L of oxygen.  We will try to wean it down.  I will consult PT OT to assess her.  Continue Solu-Medrol, bronchodilators as well as antibiotics.  Hopefully she will be stabilized enough for discharge tomorrow.   Essential hypertension: Blood pressure only slightly elevated, continue home dose of spironolactone, add as needed hydralazine.  Hypothyroidism: Continue Synthroid  Depression/anxiety: Continue Effexor and home Valium as needed  DVT prophylaxis: enoxaparin (LOVENOX) injection 40 mg Start: 02/22/22 2000    Code Status: Full Code  Family Communication:  None present at bedside.  Plan of care discussed with patient in length and he/she verbalized understanding and agreed with it.  Status is: Inpatient Remains inpatient appropriate because: Still symptomatic.   Estimated body mass index is 22.95 kg/m as calculated from the following:   Height as of this encounter: 5' (1.524 m).   Weight as of this encounter: 53.3 kg.    Nutritional Assessment: Body mass index is 22.95 kg/m.Marland Kitchen Seen by dietician.  I agree with the assessment and plan as outlined below: Nutrition Status:        . Skin Assessment: I have examined the patient's skin and I agree with the wound assessment as performed by the wound care RN as outlined below:    Consultants:  None  Procedures:  None  Antimicrobials:  Anti-infectives (From admission, onward)    Start     Dose/Rate Route Frequency Ordered Stop   02/22/22 2200  nitrofurantoin (MACRODANTIN) capsule 50 mg       Note to Pharmacy: TAKE 1 CAPSULE(50 MG) BY MOUTH AT BEDTIME     50 mg Oral Daily at bedtime 02/22/22 0738     02/22/22 1930  azithromycin (ZITHROMAX) tablet 500 mg       See Hyperspace for full Linked Orders Report.   500 mg Oral Daily 02/21/22 1915 02/26/22 0959   02/21/22 2200  oseltamivir (TAMIFLU) capsule 75 mg  Status:  Discontinued        75 mg Oral 2 times daily 02/21/22 1825 02/21/22 1832   02/21/22 1915  azithromycin (ZITHROMAX) 500 mg in sodium chloride 0.9 % 250  mL IVPB       See Hyperspace for full Linked Orders Report.   500 mg 250 mL/hr over 60 Minutes Intravenous Every 24 hours 02/21/22 1915 02/22/22 0030   02/21/22 1900  oseltamivir (TAMIFLU) capsule 30 mg        30 mg Oral 2 times daily 02/21/22 1833 02/26/22 2159         Subjective:  Patient seen and examined.  She said that she is feeling better but is still not back to baseline.  She still feels short of breath.  Objective: Vitals:   02/22/22 2157 02/23/22 0558  02/23/22 0802 02/23/22 0804  BP: (!) 167/89 (!) 152/72 (!) 160/91   Pulse: 75 62 77   Resp: '16 16 18   '$ Temp: 97.8 F (36.6 C) 98.1 F (36.7 C) 97.7 F (36.5 C)   TempSrc: Oral  Oral   SpO2: 97% 94% 98% 96%  Weight:      Height:        Intake/Output Summary (Last 24 hours) at 02/23/2022 1102 Last data filed at 02/23/2022 0523 Gross per 24 hour  Intake 60 ml  Output 1300 ml  Net -1240 ml    Filed Weights   02/21/22 1557 02/21/22 2035  Weight: 54.4 kg 53.3 kg    Examination:  General exam: Appears calm and comfortable  Respiratory system: Very faint rhonchi bilaterally, no wheezes today, no crackles, respiratory effort normal. Cardiovascular system: S1 & S2 heard, RRR. No JVD, murmurs, rubs, gallops or clicks. No pedal edema. Gastrointestinal system: Abdomen is nondistended, soft and nontender. No organomegaly or masses felt. Normal bowel sounds heard. Central nervous system: Alert and oriented. No focal neurological deficits. Extremities: Symmetric 5 x 5 power. Skin: No rashes, lesions or ulcers.  Psychiatry: Judgement and insight appear normal. Mood & affect appropriate.   Data Reviewed: I have personally reviewed following labs and imaging studies  CBC: Recent Labs  Lab 02/21/22 1559 02/22/22 0818 02/23/22 0506  WBC 12.1* 12.7* 18.3*  NEUTROABS 8.6*  --  16.5*  HGB 12.5 12.0 12.3  HCT 39.1 35.2* 37.8  MCV 93.1 89.3 91.7  PLT 241 242 841    Basic Metabolic Panel: Recent Labs  Lab 02/21/22 1559 02/22/22 0818 02/23/22 0506  NA 139 138 138  K 4.1 4.3 4.5  CL 100 102 102  CO2 '29 28 26  '$ GLUCOSE 112* 154* 131*  BUN '15 13 23  '$ CREATININE 0.88 0.71 0.84  CALCIUM 9.2 8.6* 9.0    GFR: Estimated Creatinine Clearance: 35.8 mL/min (by C-G formula based on SCr of 0.84 mg/dL). Liver Function Tests: No results for input(s): "AST", "ALT", "ALKPHOS", "BILITOT", "PROT", "ALBUMIN" in the last 168 hours. No results for input(s): "LIPASE", "AMYLASE" in the last 168  hours. No results for input(s): "AMMONIA" in the last 168 hours. Coagulation Profile: No results for input(s): "INR", "PROTIME" in the last 168 hours. Cardiac Enzymes: No results for input(s): "CKTOTAL", "CKMB", "CKMBINDEX", "TROPONINI" in the last 168 hours. BNP (last 3 results) No results for input(s): "PROBNP" in the last 8760 hours. HbA1C: No results for input(s): "HGBA1C" in the last 72 hours. CBG: No results for input(s): "GLUCAP" in the last 168 hours. Lipid Profile: No results for input(s): "CHOL", "HDL", "LDLCALC", "TRIG", "CHOLHDL", "LDLDIRECT" in the last 72 hours. Thyroid Function Tests: No results for input(s): "TSH", "T4TOTAL", "FREET4", "T3FREE", "THYROIDAB" in the last 72 hours. Anemia Panel: No results for input(s): "VITAMINB12", "FOLATE", "FERRITIN", "TIBC", "IRON", "RETICCTPCT" in the last 72 hours. Sepsis Labs:  Recent Labs  Lab 02/22/22 0818  PROCALCITON <0.10    Recent Results (from the past 240 hour(s))  Resp panel by RT-PCR (RSV, Flu A&B, Covid) Anterior Nasal Swab     Status: Abnormal   Collection Time: 02/21/22  3:59 PM   Specimen: Anterior Nasal Swab  Result Value Ref Range Status   SARS Coronavirus 2 by RT PCR NEGATIVE NEGATIVE Final    Comment: (NOTE) SARS-CoV-2 target nucleic acids are NOT DETECTED.  The SARS-CoV-2 RNA is generally detectable in upper respiratory specimens during the acute phase of infection. The lowest concentration of SARS-CoV-2 viral copies this assay can detect is 138 copies/mL. A negative result does not preclude SARS-Cov-2 infection and should not be used as the sole basis for treatment or other patient management decisions. A negative result may occur with  improper specimen collection/handling, submission of specimen other than nasopharyngeal swab, presence of viral mutation(s) within the areas targeted by this assay, and inadequate number of viral copies(<138 copies/mL). A negative result must be combined with clinical  observations, patient history, and epidemiological information. The expected result is Negative.  Fact Sheet for Patients:  EntrepreneurPulse.com.au  Fact Sheet for Healthcare Providers:  IncredibleEmployment.be  This test is no t yet approved or cleared by the [place] FDA and  has been authorized for detection and/or diagnosis of SARS-CoV-2 by FDA under an Emergency Use Authorization (EUA). This EUA will remain  in effect (meaning this test can be used) for the duration of the COVID-19 declaration under Section 564(b)(1) of the Act, 21 U.S.C.section 360bbb-3(b)(1), unless the authorization is terminated  or revoked sooner.       Influenza A by PCR POSITIVE (A) NEGATIVE Final   Influenza B by PCR NEGATIVE NEGATIVE Final    Comment: (NOTE) The Xpert Xpress SARS-CoV-2/FLU/RSV plus assay is intended as an aid in the diagnosis of influenza from Nasopharyngeal swab specimens and should not be used as a sole basis for treatment. Nasal washings and aspirates are unacceptable for Xpert Xpress SARS-CoV-2/FLU/RSV testing.  Fact Sheet for Patients: EntrepreneurPulse.com.au  Fact Sheet for Healthcare Providers: IncredibleEmployment.be  This test is not yet approved or cleared by the [place] FDA and has been authorized for detection and/or diagnosis of SARS-CoV-2 by FDA under an Emergency Use Authorization (EUA). This EUA will remain in effect (meaning this test can be used) for the duration of the COVID-19 declaration under Section 564(b)(1) of the Act, 21 U.S.C. section 360bbb-3(b)(1), unless the authorization is terminated or revoked.     Resp Syncytial Virus by PCR NEGATIVE NEGATIVE Final    Comment: (NOTE) Fact Sheet for Patients: EntrepreneurPulse.com.au  Fact Sheet for Healthcare Providers: IncredibleEmployment.be  This test is not yet approved or cleared  by the [place] FDA and has been authorized for detection and/or diagnosis of SARS-CoV-2 by FDA under an Emergency Use Authorization (EUA). This EUA will remain in effect (meaning this test can be used) for the duration of the COVID-19 declaration under Section 564(b)(1) of the Act, 21 U.S.C. section 360bbb-3(b)(1), unless the authorization is terminated or revoked.  Performed at [facility], [place] [address], [place], [place] [address]      Radiology Studies: DG Chest Portable 1 View  Result Date: 02/21/2022 CLINICAL DATA:  Shortness of breath. EXAM: PORTABLE CHEST 1 VIEW COMPARISON:  10/29/2021. FINDINGS: 1613 hours. Bibasilar opacities may reflect aspiration or atelectasis. Stable cardiac and mediastinal contours with atherosclerotic calcifications and tortuosity of the aorta, accounting for patient rotation. Chronic right rib  fractures again noted. IMPRESSION: Bibasilar airspace opacities possibly reflecting aspiration or atelectasis. Aortic Atherosclerosis (ICD10-I70.0). Electronically Signed   By: Emmit Alexanders M.D.   On: 02/21/2022 16:24    Scheduled Meds:  aspirin EC  81 mg Oral Daily   azithromycin  500 mg Oral Daily   ciprofloxacin  1 drop Left Eye Q4H while awake   enoxaparin (LOVENOX) injection  40 mg Subcutaneous Q24H   fluticasone furoate-vilanterol  1 puff Inhalation Daily   And   umeclidinium bromide  1 puff Inhalation Daily   furosemide  20 mg Oral Daily   guaiFENesin  600 mg Oral BID   levothyroxine  100 mcg Oral Q0600   methylPREDNISolone (SOLU-MEDROL) injection  40 mg Intravenous Q12H   nitrofurantoin  50 mg Oral QHS   oseltamivir  30 mg Oral BID   spironolactone  37.5 mg Oral BID   venlafaxine  50 mg Oral BID   vitamin B-12  100 mcg Oral Daily   Continuous Infusions:   LOS: 2 days   Darliss Cheney, MD [facility]  02/23/2022, 11:02 AM   *Please note that this is a verbal dictation therefore any spelling or grammatical errors are due to  the "[place]" system interpretation.  Please page via [place] and do not message via secure chat for urgent patient care matters. Secure chat can be used for non urgent patient care matters.  How to contact the [facility] Attending or Consulting provider [place] or covering provider during after hours [place], for this patient?  Check the care team in [facility] and look for a) attending/consulting TR[facility] provider listed and b) the [facility] team listed. Page or secure chat [phone]. Log into [email or web address] and use [place]'s universal password to access. If you do not have the password, please contact the hospital operator. Locate the [facility] provider you are looking for under [facility] and page to a number that you can be directly reached. If you still have difficulty reaching the provider, please page the [facility] (Director on Call) for the Hospitalists listed on amion for assistance.

## 2022-02-23 NOTE — Evaluation (Signed)
Occupational Therapy Evaluation Patient Details Name: Priscilla Cantrell MRN: [id number] DOB: 08/31/1935 Today's Date: 02/23/2022   History of Present Illness Pt is an 87 y.o. female admitted 1/19 with acute on chronic hypoxic resp failure secondary to COPD exacerbation and influenza A infection.  Pt also with recent dx of covid and RSV. PMH: COPD, chronic respiratory failure with hypoxia on 2 L O2 via [place], chronic lower extremity edema, depression/anxiety   Clinical Impression   At baseline pt lives independently with her significant, occasionally using her cane. She is on 2L at baseline and is seen regularly by her "lung doctor". Able to ambulate @ 150 ft with 1 standing rest break on 2L with SpO2 remaining above 95 with minimal DOE noted (pt also wearing a mask). Pt fatigues more easily; began educating pt on energy conservation strategies, including pursed lip breathing techniques. Pt states "it's been awhile since my breathing has given me problems". Will follow up tomorrow to educate on energy conservation and review warning signs/symptoms of COPD exacerbation prior ot DC home.  Encourage ambulation with staff.      Recommendations for follow up therapy are one component of a multi-disciplinary discharge planning process, led by the attending physician.  Recommendations may be updated based on patient status, additional functional criteria and insurance authorization.   Follow Up Recommendations  No OT follow up     Assistance Recommended at Discharge Intermittent Supervision/Assistance  Patient can return home with the following Assist for transportation;Assistance with cooking/housework    Functional Status Assessment  Patient has had a recent decline in their functional status and demonstrates the ability to make significant improvements in function in a reasonable and predictable amount of time.  Equipment Recommendations  None recommended by OT    Recommendations for Other Services        Precautions / Restrictions Precautions Precautions: Fall;Other (comment) Precaution Comments: watch sats      Mobility Bed Mobility Overal bed mobility: Modified Independent                  Transfers Overall transfer level: Needs assistance Equipment used: None Transfers: Sit to/from Stand Sit to Stand: Supervision           General transfer comment: supervision for safety      Balance Overall balance assessment: Mild deficits observed, not formally tested                                         ADL either performed or assessed with clinical judgement   ADL Overall ADL's : At baseline                                       General ADL Comments: easily fatigues Began education strategies - discussed possibility of using a rollator, however pt states she does not want to "look old"     Vision Baseline Vision/History: 1 Wears glasses       Perception     Praxis      Pertinent Vitals/Pain       Hand Dominance Right   Extremity/Trunk Assessment Upper Extremity Assessment Upper Extremity Assessment: Overall WFL for tasks assessed;Generalized weakness   Lower Extremity Assessment Lower Extremity Assessment: Defer to PT evaluation   Cervical / Trunk Assessment Cervical / Trunk Assessment: Kyphotic  Communication Communication Communication: No difficulties   Cognition Arousal/Alertness: Awake/alert Behavior During Therapy: WFL for tasks assessed/performed Overall Cognitive Status: Within Functional Limits for tasks assessed                                       General Comments  on 2L    Exercises Exercises: Other exercises Other Exercises Other Exercises: pursed lip breathing   Shoulder Instructions      Home Living Family/patient expects to be discharged to:: Private residence Living Arrangements: Spouse/significant other Available Help at Discharge: Friend(s);Available 24  hours/day Type of Home: House Home Access: Level entry     Home Layout: One level     Bathroom Shower/Tub: Occupational psychologist: Handicapped height Bathroom Accessibility: Yes How Accessible: Accessible via walker Home Equipment: Shower seat;Cane - single point;Other (comment);Rolling Walker (2 wheels) (home O2; unsure if she has a RW)   Additional Comments: Above set up is for pt's boyfriend/significant other's house, which she plans to stay at upon d/c.      Prior Functioning/Environment Prior Level of Function : Independent/Modified Independent;Driving             Mobility Comments: no AD, 2L O2 at baseline          OT Problem List: Cardiopulmonary status limiting activity;Decreased activity tolerance      OT Treatment/Interventions: Self-care/ADL training;Energy conservation;DME and/or AE instruction;Patient/family education    OT Goals(Current goals can be found in the care plan section) Acute Rehab OT Goals Patient Stated Goal: to go home OT Goal Formulation: With patient Time For Goal Achievement: 03/09/22 Potential to Achieve Goals: Good  OT Frequency: Min 2X/week    Co-evaluation              AM-PAC OT "6 Clicks" Daily Activity     Outcome Measure Help from another person eating meals?: None Help from another person taking care of personal grooming?: None Help from another person toileting, which includes using toliet, bedpan, or urinal?: None Help from another person bathing (including washing, rinsing, drying)?: None Help from another person to put on and taking off regular upper body clothing?: None Help from another person to put on and taking off regular lower body clothing?: None 6 Click Score: 24   End of Session Equipment Utilized During Treatment: Gait belt;Oxygen (2L O2) Nurse Communication: Mobility status;Other (comment) (encourae ambulation)  Activity Tolerance: Patient tolerated treatment well Patient left: in  chair;with call bell/phone within reach  OT Visit Diagnosis: Unsteadiness on feet (R26.81);Muscle weakness (generalized) (M62.81)                Time: 3254-9826 OT Time Calculation (min): 34 min Charges:  OT General Charges $OT Visit: 1 Visit OT Evaluation $OT Eval Low Complexity: 1 Low OT Treatments $Self Care/Home Management : 8-22 mins  Maurie Boettcher, OT/L   Acute OT Clinical Specialist Acute Rehabilitation Services Pager [phone] Office [phone]   [facility] 02/23/2022, 3:16 PM

## 2022-02-23 NOTE — Evaluation (Signed)
Physical Therapy Evaluation Patient Details Name: JAQUILLA WOODROOF MRN: [id number] DOB: November 07, 1935 Today's Date: 02/23/2022  History of Present Illness  Pt is an 87 y.o. female admitted 1/19 with acute on chronic hypoxic resp failure secondary to COPD exacerbation and influenza A infection.  Pt also with recent dx of covid and RSV. PMH: COPD, chronic respiratory failure with hypoxia on 2 L O2 via [place], chronic lower extremity edema, depression/anxiety   Clinical Impression  Pt admitted with above diagnosis. PTA pt living with significant other in his one level house, independent mobility/ADLs/driving and on 2L home O2. Pt currently with functional limitations due to the deficits listed below (see PT Problem List). On eval, pt demonstrated mod I bed mobility. Supervision provided for transfers and amb 100' without AD. Pt on 3L continuous O2. SpO2 95% at rest and 90-91% during mobility. No SOB/DOE noted. Pt will benefit from skilled PT to increase their independence and safety with mobility to allow discharge home. PT to follow acutely. No follow up services indicated.     Recommendations for follow up therapy are one component of a multi-disciplinary discharge planning process, led by the attending physician.  Recommendations may be updated based on patient status, additional functional criteria and insurance authorization.  Follow Up Recommendations No PT follow up      Assistance Recommended at Discharge PRN  Patient can return home with the following  Assistance with cooking/housework;Assist for transportation    Equipment Recommendations None recommended by PT  Recommendations for Other Services       Functional Status Assessment Patient has had a recent decline in their functional status and demonstrates the ability to make significant improvements in function in a reasonable and predictable amount of time.     Precautions / Restrictions Precautions Precautions: Fall;Other  (comment) Precaution Comments: watch sats      Mobility  Bed Mobility Overal bed mobility: Modified Independent                  Transfers Overall transfer level: Needs assistance Equipment used: None Transfers: Sit to/from Stand Sit to Stand: Supervision           General transfer comment: supervision for safety    Ambulation/Gait Ambulation/Gait assistance: Supervision, Min guard Gait Distance (Feet): 100 Feet Assistive device: None Gait Pattern/deviations: Step-through pattern, Decreased stride length Gait velocity: decreased Gait velocity interpretation: <1.31 ft/sec, indicative of household ambulator   General Gait Details: Amb laps in room to avoid pt having to mask in hallway. Mild LOB x 1 but pt able to self correct. Mobilized on 3L with SpO2 90-91%. SpO2 95% at rest on 3L. No SOB/DOE noted. Pt able to hold conversation throughout. Shoes donned for amb.  Stairs            Wheelchair Mobility    Modified Rankin (Stroke Patients Only)       Balance Overall balance assessment: Mild deficits observed, not formally tested                                           Pertinent Vitals/Pain Pain Assessment Pain Assessment: No/denies pain    Home Living Family/patient expects to be discharged to:: Private residence Living Arrangements: Spouse/significant other Available Help at Discharge: Friend(s);Available 24 hours/day Type of Home: House Home Access: Level entry       Home Layout: One level Home Equipment: Shower seat;Cane -  single point;Other (comment) (home O2) Additional Comments: Above set up is for pt's boyfriend/significant other's house, which she plans to stay at upon d/c.    Prior Function Prior Level of Function : Independent/Modified Independent;Driving             Mobility Comments: no AD, 2L O2 at baseline       Hand Dominance   Dominant Hand: Right    Extremity/Trunk Assessment   Upper  Extremity Assessment Upper Extremity Assessment: Defer to OT evaluation    Lower Extremity Assessment Lower Extremity Assessment: Generalized weakness    Cervical / Trunk Assessment Cervical / Trunk Assessment: Kyphotic  Communication   Communication: No difficulties  Cognition Arousal/Alertness: Awake/alert Behavior During Therapy: WFL for tasks assessed/performed Overall Cognitive Status: Within Functional Limits for tasks assessed                                          General Comments General comments (skin integrity, edema, etc.): Pt on 3L continuous O2. SpO2 95% at rest and 90-91% during mobility.    Exercises     Assessment/Plan    PT Assessment Patient needs continued PT services  PT Problem List Decreased strength;Decreased balance;Decreased mobility;Cardiopulmonary status limiting activity;Decreased activity tolerance       PT Treatment Interventions Functional mobility training;Balance training;Patient/family education;Therapeutic activities;Gait training;Therapeutic exercise    PT Goals (Current goals can be found in the Care Plan section)  Acute Rehab PT Goals Patient Stated Goal: independence PT Goal Formulation: With patient Time For Goal Achievement: 03/02/22 Potential to Achieve Goals: Good    Frequency Min 3X/week     Co-evaluation               AM-PAC PT "6 Clicks" Mobility  Outcome Measure Help needed turning from your back to your side while in a flat bed without using bedrails?: None Help needed moving from lying on your back to sitting on the side of a flat bed without using bedrails?: None Help needed moving to and from a bed to a chair (including a wheelchair)?: A Little Help needed standing up from a chair using your arms (e.g., wheelchair or bedside chair)?: A Little Help needed to walk in hospital room?: A Little Help needed climbing 3-5 steps with a railing? : A Little 6 Click Score: 20    End of Session  Equipment Utilized During Treatment: Oxygen Activity Tolerance: Patient tolerated treatment well Patient left: in bed;with call bell/phone within reach;with bed alarm set Nurse Communication: Mobility status PT Visit Diagnosis: Difficulty in walking, not elsewhere classified (R26.2)    Time: 3149-7026 PT Time Calculation (min) (ACUTE ONLY): 34 min   Charges:   PT Evaluation $PT Eval Moderate Complexity: 1 Mod PT Treatments $Gait Training: 8-22 mins        Lorrin Goodell, PT  Office # [phone] Pager [phone]   Lorriane Shire 02/23/2022, 1:47 PM

## 2022-02-24 DIAGNOSIS — J9621 Acute and chronic respiratory failure with hypoxia: Secondary | ICD-10-CM

## 2022-02-24 MED ORDER — OSELTAMIVIR PHOSPHATE 30 MG PO CAPS: 30.0000 mg | ORAL_CAPSULE | Freq: Two times a day (BID) | ORAL | 0 refills | Status: DC

## 2022-02-24 MED ORDER — PREDNISONE 10 MG PO TABS: ORAL_TABLET | ORAL | 0 refills | Status: DC

## 2022-02-24 MED ORDER — AZITHROMYCIN 500 MG PO TABS: 500.0000 mg | ORAL_TABLET | Freq: Every day | ORAL | 0 refills | Status: DC

## 2022-02-24 NOTE — Progress Notes (Signed)
Occupational Therapy Treatment Patient Details Name: Priscilla Cantrell MRN: [id number] DOB: Mar 11, 1935 Today's Date: 02/24/2022   History of present illness Pt is an 87 y.o. female admitted 1/19 with acute on chronic hypoxic resp failure secondary to COPD exacerbation and influenza A infection.  Pt also with recent dx of covid and RSV. PMH: COPD, chronic respiratory failure with hypoxia on 2 L O2 via [place], chronic lower extremity edema, depression/anxiety   OT comments  Pt appears a little more confused this am however mentation improved as session progressed. Pt states she has not walked with staff since walking with therapy yesterday. Reviewed energy conservation and pursed lip breathing. Stressed importance of using her cane or RW for mobility and pacing herself. Pt verbalized understanding. SpO2 94 on 2L at rest and desat to 89 with mobility with quick rebound with pursed lip breathing. Will continue to follow while in the hospital.  Encourage ambulation with staff.    Recommendations for follow up therapy are one component of a multi-disciplinary discharge planning process, led by the attending physician.  Recommendations may be updated based on patient status, additional functional criteria and insurance authorization.    Follow Up Recommendations  No OT follow up     Assistance Recommended at Discharge Intermittent Supervision/Assistance  Patient can return home with the following  Assist for transportation;Assistance with cooking/housework   Equipment Recommendations  None recommended by OT    Recommendations for Other Services      Precautions / Restrictions Precautions Precautions: Fall;Other (comment) Precaution Comments: watch sats       Mobility Bed Mobility                    Transfers Overall transfer level: Needs assistance     Sit to Stand: Supervision                 Balance Overall balance assessment: Mild deficits observed, not formally  tested                                         ADL either performed or assessed with clinical judgement   ADL Overall ADL's : At baseline                                       General ADL Comments: easily fatigues; reviewed energy conservation using handout; encouraged frequent breaks with pursed lip breathing. Pt abe to return demonstrate. Pt [place] O2 line without difficulty    Extremity/Trunk Assessment Upper Extremity Assessment Upper Extremity Assessment: Generalized weakness   Lower Extremity Assessment Lower Extremity Assessment: Defer to PT evaluation        Vision       Perception     Praxis      Cognition Arousal/Alertness: Awake/alert Behavior During Therapy: WFL for tasks assessed/performed Overall Cognitive Status: No family/caregiver present to determine baseline cognitive functioning                                 General Comments: appears a little more confused this am however mentation improved as session progressed        Exercises Other Exercises Other Exercises: pursed lip breathing    Shoulder Instructions       General  Comments      Pertinent Vitals/ Pain       Pain Assessment Pain Assessment: No/denies pain  Home Living                                          Prior Functioning/Environment              Frequency  Min 2X/week        Progress Toward Goals  OT Goals(current goals can now be found in the care plan section)  Progress towards OT goals: Progressing toward goals  Acute Rehab OT Goals Patient Stated Goal: to go home today OT Goal Formulation: With patient Time For Goal Achievement: 03/09/22 Potential to Achieve Goals: Good ADL Goals Additional ADL Goal #1: pt will verbalize 3 energy conservation strategies Additional ADL Goal #2: Pt will identify 3 signs/symptoms of COPD exaceration  Plan Discharge plan remains appropriate     Co-evaluation                 AM-PAC OT "6 Clicks" Daily Activity     Outcome Measure   Help from another person eating meals?: None Help from another person taking care of personal grooming?: None Help from another person toileting, which includes using toliet, bedpan, or urinal?: None Help from another person bathing (including washing, rinsing, drying)?: None Help from another person to put on and taking off regular upper body clothing?: None Help from another person to put on and taking off regular lower body clothing?: None 6 Click Score: 24    End of Session Equipment Utilized During Treatment: Gait belt  OT Visit Diagnosis: Unsteadiness on feet (R26.81);Muscle weakness (generalized) (M62.81)   Activity Tolerance Patient tolerated treatment well   Patient Left in chair;with call bell/phone within reach   Nurse Communication Mobility status;Other (comment) (encourage ambulation wtih staff)        Time: 6010-9323 OT Time Calculation (min): 28 min  Charges: OT General Charges $OT Visit: 1 Visit OT Treatments $Self Care/Home Management : 23-37 mins  Maurie Boettcher, OT/L   Acute OT Clinical Specialist [place] Pager [phone] Office [phone]   [facility] 02/24/2022, 8:51 AM

## 2022-02-24 NOTE — Progress Notes (Signed)
Mobility Specialist Progress Note:   02/24/22 1005  Mobility  Activity Ambulated independently in room  Level of Assistance Independent  Assistive Device None  Distance Ambulated (ft) 200 ft  Activity Response Tolerated well  Mobility Referral Yes  $Mobility charge 1 Mobility   Pt received in chair and agreeable. No complaints. Pt returned to chair with all needs met and call bell in reach.   Andrey Campanile Mobility Specialist Please contact via SecureChat or  Rehab office at [phone]

## 2022-02-24 NOTE — Discharge Summary (Signed)
Physician Discharge Summary   Patient: Priscilla Cantrell MRN: [id number] DOB: 16-Dec-1935  Admit date:     02/21/2022  Discharge date: 02/24/22  Discharge Physician: Patrecia Pour   PCP: [name], MD   Recommendations at discharge:   Follow up with Pulmonology and PCP in next 1-2 weeks  Discharge Diagnoses: Principal Problem:   Acute on chronic respiratory failure with hypoxia ([place]) Active Problems:   Influenza A   COPD GOLD III B   Depression with anxiety   Essential hypertension   Sepsis [facility])  Hospital Course: Priscilla Cantrell is an 87 y.o. female with medical history significant for COPD, chronic respiratory failure with hypoxia on 2 L O2 via [place], chronic lower extremity edema, depression/anxiety who presented to the ED for evaluation of dyspnea and was diagnosed with acute on chronic hypoxic respite failure secondary to COPD exacerbation and influenza A infection.  Portable chest x-ray shows bibasilar airspace opacities procalcitonin negative, arguing against any bacterial infection.  Assessment and Plan: Acute on chronic hypoxic respiratory failure due to COPD exacerbation in setting of sepsis secondary to influenza A infection: Has returned to baseline respiratory status with treatment, will continue tamiflu, azithromycin, steroid taper at discharge and follow up with pulmonology, [name]. Continue bronchodilators at home which she has already.    Essential hypertension: Blood pressure only slightly elevated, continue home dose of spironolactone    Hypothyroidism: Continue Synthroid   Depression/anxiety: Continue Effexor and home Valium as needed   Consultants: None Procedures performed: None  Disposition: Home Diet recommendation: Regular DISCHARGE MEDICATION: Allergies as of 02/24/2022       Reactions   Losartan Other (See Comments)   hyperkalemia   Penicillins Hives, Swelling, Rash   Has patient had a PCN reaction causing immediate rash,  facial/tongue/throat swelling, SOB or lightheadedness with hypotension: YES Has patient had a PCN reaction causing severe rash involving mucus membranes or skin necrosis: NO Has patient had a PCN reaction that required hospitalization NO Has patient had a PCN reaction occurring within the last 10 years: NO If all of the above answers are "NO", then may proceed with Cephalosporin use.   Cefixime [kdc:cefixime] Other (See Comments)   unspecified   Gabapentin Other (See Comments)   Feels woozy   Indocin [indomethacin] Other (See Comments)   Painful tongue and throat   Singulair [montelukast Sodium]    Chest tightness, decreased mental clarity   Sulfa Antibiotics Other (See Comments)   unspecified   Ace Inhibitors Other (See Comments)   cough   Statins Other (See Comments)   Myalgias        Medication List     STOP taking these medications    nitrofurantoin 50 MG capsule Commonly known as: MACRODANTIN       TAKE these medications    albuterol (2.5 MG/3ML) 0.083% nebulizer solution Commonly known as: PROVENTIL Take 3 mLs (2.5 mg total) by nebulization every 6 (six) hours as needed for wheezing or shortness of breath.   albuterol 108 (90 Base) MCG/ACT inhaler Commonly known as: ProAir HFA Inhale 2 puffs into the lungs every 4 (four) hours as needed for wheezing or shortness of breath.   alendronate 70 MG tablet Commonly known as: FOSAMAX TAKE 1 TABLET(70 MG) BY MOUTH 1 TIME A WEEK WITH A FULL GLASS OF WATER AND ON AN EMPTY STOMACH What changed: See the new instructions.   aspirin EC 81 MG tablet Take 81 mg by mouth daily.   azelastine 0.1 %  nasal spray Commonly known as: ASTELIN Place 2 sprays into both nostrils 2 (two) times daily. Use in each nostril as directed   azithromycin 500 MG tablet Commonly known as: ZITHROMAX Take 1 tablet (500 mg total) by mouth daily.   cetirizine 10 MG tablet Commonly known as: ZYRTEC Take 1 tablet (10 mg total) by mouth  daily. What changed:  when to take this reasons to take this   ciprofloxacin 0.3 % ophthalmic solution Commonly known as: Ciloxan Place 1 drop into the left eye every 4 (four) hours while awake. Administer 1 drop, every 2 hours, while awake, for 2 days. Then 1 drop, every 4 hours, while awake, for the next 5 days.   clobetasol 0.05 % external solution Commonly known as: TEMOVATE APPLY TO THE AFFECTED AREA EVERY DAY   diazepam 5 MG tablet Commonly known as: VALIUM TAKE 1 TABLET BY MOUTH DAILY AS NEEDED FOR ANXIETY OR INSOMNIA What changed:  how much to take how to take this when to take this reasons to take this   fluticasone 0.05 % cream Commonly known as: CUTIVATE Apply topically 2 (two) times daily.   fluticasone 50 MCG/ACT nasal spray Commonly known as: FLONASE Place 2 sprays into both nostrils daily.   furosemide 20 MG tablet Commonly known as: LASIX 2 tabs qd alt with 1 tab qd   Gemtesa 75 MG Tabs Generic drug: Vibegron Take 1 capsule by mouth daily.   levothyroxine 100 MCG tablet Commonly known as: SYNTHROID Take 1 tablet (100 mcg total) by mouth daily.   oseltamivir 30 MG capsule Commonly known as: TAMIFLU Take 1 capsule (30 mg total) by mouth 2 (two) times daily.   predniSONE 10 MG tablet Commonly known as: DELTASONE Take 4 tablets (40 mg total) by mouth daily with breakfast for 2 days, THEN 2 tablets (20 mg total) daily with breakfast for 2 days, THEN 1 tablet (10 mg total) daily with breakfast for 2 days. Take '60mg'$  x 2 days, '40mg'$  x 2 days, '20mg'$  x 3 days. Start taking on: February 24, 2022   Pulse Oximeter Misc Use to check heart rate and oxygen daily.   RESTASIS OP Place 1 drop into both eyes daily as needed (dry eyes).   roflumilast 500 MCG Tabs tablet Commonly known as: DALIRESP Take 1 tablet (500 mcg total) by mouth daily.   Soothe XP Soln Place 1 drop into both eyes 2 (two) times daily as needed (irritation).   spironolactone 25 MG  tablet Commonly known as: ALDACTONE 1.5 tab po bid What changed:  how much to take how to take this when to take this   Trelegy Ellipta 100-62.5-25 MCG/ACT Aepb Generic drug: Fluticasone-Umeclidin-Vilant Inhale 1 puff into the lungs daily.   triamcinolone cream 0.1 % Commonly known as: KENALOG Apply 1 application topically 2 (two) times daily.   venlafaxine 50 MG tablet Commonly known as: EFFEXOR Take 1 tablet (50 mg total) by mouth 2 (two) times daily.   vitamin B-12 100 MCG tablet Commonly known as: CYANOCOBALAMIN Take 100 mcg by mouth daily.   VITAMIN D PO Take 1 tablet by mouth daily.   Xiidra 5 % Soln Generic drug: Lifitegrast Place 1 drop into both eyes in the morning and at bedtime.        Follow-up Information     McGowen, Adrian Blackwater, MD Follow up.   Specialty: Family Medicine Contact information: [address] [place] [address] [phone]  Chesley Mires, MD Follow up.   Specialty: Pulmonary Disease Contact information: [address] [place] [address] [phone]                Discharge Exam: Danley Danker Weights   02/21/22 1557 02/21/22 2035  Weight: 54.4 kg 53.3 kg  BP (!) 155/80 (BP Location: Right Arm)   Pulse 81   Temp 97.7 F (36.5 C) (Oral)   Resp 16   Ht 5' (1.524 m)   Wt 53.3 kg   SpO2 94%   BMI 22.95 kg/m   Nontoxic elderly female in no distress Clear, nonlabored on 2L O2 RRR, no MRG or edema Soft, NT, ND No deformities or focal weakness  Condition at discharge: good  The results of significant diagnostics from this hospitalization (including imaging, microbiology, ancillary and laboratory) are listed below for reference.   Imaging Studies: DG Chest Portable 1 View  Result Date: 02/21/2022 CLINICAL DATA:  Shortness of breath. EXAM: PORTABLE CHEST 1 VIEW COMPARISON:  10/29/2021. FINDINGS: 1613 hours. Bibasilar opacities may reflect aspiration or atelectasis. Stable cardiac and mediastinal contours  with atherosclerotic calcifications and tortuosity of the aorta, accounting for patient rotation. Chronic right rib fractures again noted. IMPRESSION: Bibasilar airspace opacities possibly reflecting aspiration or atelectasis. Aortic Atherosclerosis (ICD10-I70.0). Electronically Signed   By: Emmit Alexanders M.D.   On: 02/21/2022 16:24    Microbiology: Results for orders placed or performed during the hospital encounter of 02/21/22  Resp panel by RT-PCR (RSV, Flu A&B, Covid) Anterior Nasal Swab     Status: Abnormal   Collection Time: 02/21/22  3:59 PM   Specimen: Anterior Nasal Swab  Result Value Ref Range Status   SARS Coronavirus 2 by RT PCR NEGATIVE NEGATIVE Final    Comment: (NOTE) SARS-CoV-2 target nucleic acids are NOT DETECTED.  The SARS-CoV-2 RNA is generally detectable in upper respiratory specimens during the acute phase of infection. The lowest concentration of SARS-CoV-2 viral copies this assay can detect is 138 copies/mL. A negative result does not preclude SARS-Cov-2 infection and should not be used as the sole basis for treatment or other patient management decisions. A negative result may occur with  improper specimen collection/handling, submission of specimen other than nasopharyngeal swab, presence of viral mutation(s) within the areas targeted by this assay, and inadequate number of viral copies(<138 copies/mL). A negative result must be combined with clinical observations, patient history, and epidemiological information. The expected result is Negative.  Fact Sheet for Patients:  EntrepreneurPulse.com.au  Fact Sheet for Healthcare Providers:  IncredibleEmployment.be  This test is no t yet approved or cleared by the [place] FDA and  has been authorized for detection and/or diagnosis of SARS-CoV-2 by FDA under an Emergency Use Authorization (EUA). This EUA will remain  in effect (meaning this test can be used) for the  duration of the COVID-19 declaration under Section 564(b)(1) of the Act, 21 U.S.C.section 360bbb-3(b)(1), unless the authorization is terminated  or revoked sooner.       Influenza A by PCR POSITIVE (A) NEGATIVE Final   Influenza B by PCR NEGATIVE NEGATIVE Final    Comment: (NOTE) The Xpert Xpress SARS-CoV-2/FLU/RSV plus assay is intended as an aid in the diagnosis of influenza from Nasopharyngeal swab specimens and should not be used as a sole basis for treatment. Nasal washings and aspirates are unacceptable for Xpert Xpress SARS-CoV-2/FLU/RSV testing.  Fact Sheet for Patients: EntrepreneurPulse.com.au  Fact Sheet for Healthcare Providers: IncredibleEmployment.be  This test is not yet approved or cleared by  the [employer] and has been authorized for detection and/or diagnosis of SARS-CoV-2 by FDA under an Emergency Use Authorization (EUA). This EUA will remain in effect (meaning this test can be used) for the duration of the COVID-19 declaration under Section 564(b)(1) of the Act, 21 U.S.C. section 360bbb-3(b)(1), unless the authorization is terminated or revoked.     Resp Syncytial Virus by PCR NEGATIVE NEGATIVE Final    Comment: (NOTE) Fact Sheet for Patients: EntrepreneurPulse.com.au  Fact Sheet for Healthcare Providers: IncredibleEmployment.be  This test is not yet approved or cleared by the [place] FDA and has been authorized for detection and/or diagnosis of SARS-CoV-2 by FDA under an Emergency Use Authorization (EUA). This EUA will remain in effect (meaning this test can be used) for the duration of the COVID-19 declaration under Section 564(b)(1) of the Act, 21 U.S.C. section 360bbb-3(b)(1), unless the authorization is terminated or revoked.  Performed at [facility], [place] [address], [place], [place] [address]    *Note: Due to a large number of results and/or  encounters for the requested time period, some results have not been displayed. A complete set of results can be found in Results Review.    Labs: CBC: Recent Labs  Lab 02/21/22 1559 02/22/22 0818 02/23/22 0506  WBC 12.1* 12.7* 18.3*  NEUTROABS 8.6*  --  16.5*  HGB 12.5 12.0 12.3  HCT 39.1 35.2* 37.8  MCV 93.1 89.3 91.7  PLT 241 242 343   Basic Metabolic Panel: Recent Labs  Lab 02/21/22 1559 02/22/22 0818 02/23/22 0506  NA 139 138 138  K 4.1 4.3 4.5  CL 100 102 102  CO2 '29 28 26  '$ GLUCOSE 112* 154* 131*  BUN '15 13 23  '$ CREATININE 0.88 0.71 0.84  CALCIUM 9.2 8.6* 9.0   Liver Function Tests: No results for input(s): "AST", "ALT", "ALKPHOS", "BILITOT", "PROT", "ALBUMIN" in the last 168 hours. CBG: No results for input(s): "GLUCAP" in the last 168 hours.  Discharge time spent: greater than 30 minutes.  Signed: Patrecia Pour, MD [facility] 02/25/2022

## 2022-02-25 ENCOUNTER — Telehealth: Payer: Self-pay

## 2022-02-25 NOTE — Patient Outreach (Signed)
  Care Coordination TOC Note Transition Care Management Follow-up Telephone Call Date of discharge and from where: 02/24/22-[facility]  How have you been since you were released from the hospital? Patient states she is doing good. She rested well last night. She is currently out and about running errands. She is using her oxygen as needed. She denies any SOB. She has a dry cough at times. Patient confirms she picked up her meds and are taking them.  Any questions or concerns? No  Items Reviewed: Did the pt receive and understand the discharge instructions provided? Yes  Medications obtained and verified? Yes  Other? No  Any new allergies since your discharge? No  Dietary orders reviewed? Yes Do you have support at home? Yes   Home Care and Equipment/Supplies: Were home health services ordered? not applicable If so, what is the name of the agency? N/A  Has the agency set up a time to come to the patient's home? not applicable Were any new equipment or medical supplies ordered?  No What is the name of the medical supply agency? N/A Were you able to get the supplies/equipment? not applicable Do you have any questions related to the use of the equipment or supplies? No  Functional Questionnaire: (I = Independent and D = Dependent) ADLs: A  Bathing/Dressing- I  Meal Prep- A  Eating- I  Maintaining continence- I  Transferring/Ambulation- I  Managing Meds- A  Follow up appointments reviewed:  PCP Hospital f/u appt confirmed? No  . [facility] f/u appt confirmed? Yes  Scheduled to see [name] on 03/26/22 @ 12N. Are transportation arrangements needed? No  If their condition worsens, is the pt aware to call PCP or go to the Emergency Dept.? Yes Was the patient provided with contact information for the PCP's office or ED? Yes Was to pt encouraged to call back with questions or concerns? Yes  SDOH assessments and interventions completed:   Yes SDOH Interventions Today     Flowsheet Row Most Recent Value  SDOH Interventions   Food Insecurity Interventions Intervention Not Indicated  Transportation Interventions Intervention Not Indicated       Care Coordination Interventions:  PCP follow up appointment requested Education provided on resp condition, med mgmt, s/s of worsening condition/when to seek medical attention    Encounter Outcome:  Pt. Visit Completed    Enzo Montgomery, RN,BSN,CCM [place] Telephonic Care Management Coordinator Direct Phone: [phone] Toll Free: [phone] Fax: [phone]

## 2022-02-25 NOTE — Patient Outreach (Signed)
  Care Coordination TOC Note Transition Care Management Unsuccessful Follow-up Telephone Call  Date of discharge and from where:  02/24/22-[facility]   Attempts:  1st Attempt  Reason for unsuccessful TCM follow-up call:  Unable to leave message     Enzo Montgomery, RN,BSN,CCM [facility] Telephonic Care Management Coordinator Direct Phone: [phone] Toll Free: [phone] Fax: [phone]

## 2022-02-26 ENCOUNTER — Telehealth: Payer: Self-pay | Admitting: *Deleted

## 2022-02-26 NOTE — Progress Notes (Unsigned)
  Care Coordination  Note  02/26/2022 Name: Priscilla Cantrell MRN: [id number] DOB: September 04, 1935  Priscilla Cantrell is a 87 y.o. year old primary care patient of McGowen, Adrian Blackwater, MD. I reached out to Sherald Barge by phone today to assist with scheduling a follow up appointment. Sherald Barge verbally consented to my assistance.       Follow up plan: Unsuccessful telephone outreach attempt made. A HIPAA compliant phone message was left for the patient providing contact information and requesting a return call.   Julian Hy, [place] Direct Dial: [phone]

## 2022-02-27 NOTE — Progress Notes (Signed)
Pt already scheduled with pcp for 03/03/2022

## 2022-03-03 ENCOUNTER — Encounter: Payer: Self-pay | Admitting: Family Medicine

## 2022-03-03 ENCOUNTER — Ambulatory Visit (INDEPENDENT_AMBULATORY_CARE_PROVIDER_SITE_OTHER): Payer: Medicare PPO | Admitting: Family Medicine

## 2022-03-03 VITALS — BP 125/75 | HR 88 | Temp 98.0°F | Ht 60.0 in | Wt 115.4 lb

## 2022-03-03 DIAGNOSIS — J441 Chronic obstructive pulmonary disease with (acute) exacerbation: Secondary | ICD-10-CM

## 2022-03-03 DIAGNOSIS — J9621 Acute and chronic respiratory failure with hypoxia: Secondary | ICD-10-CM

## 2022-03-03 DIAGNOSIS — J101 Influenza due to other identified influenza virus with other respiratory manifestations: Secondary | ICD-10-CM | POA: Diagnosis not present

## 2022-03-03 MED ORDER — FUROSEMIDE 20 MG PO TABS: ORAL_TABLET | ORAL | 1 refills | Status: DC

## 2022-03-03 MED ORDER — AZELASTINE HCL 0.1 % NA SOLN: 2.0000 | Freq: Two times a day (BID) | NASAL | 3 refills | Status: DC

## 2022-03-03 NOTE — Progress Notes (Signed)
03/03/2022  CC: hosp f/u  Patient is a 87 y.o. Caucasian female who presents accompanied by her significant other [name]) for hospital follow up, specifically [place] face-to-face visit. Dates hospitalized: 1/19 to 02/24/2022. Days since d/c from hospital: 7 days Patient was discharged from hospital to home. Reason for admission to hospital: Acute on chronic respiratory failure with hypoxia. Date of interactive (phone) contact with patient and/or caregiver: 02/25/22  I have reviewed patient's discharge summary plus pertinent specific notes, labs, and imaging from the hospitalization.   Influenza A was positive.  Portable chest x-ray showed bibasilar airspace opacities->atelectasis vs pneumonia.  Procalcitonin negative. Treated with Tamiflu, azithromycin, and steroid taper. She was also instructed to resume her previous chronic medications: Albuterol, Fosamax, aspirin, Zyrtec, diazepam, Flonase, furosemide as needed, Gemtesa, levothyroxine, Daliresp, spironolactone, Trelegy Ellipta, and venlafaxine.  CURRENTLY: She is doing pretty well.  Still some nasal congestion and postnasal drip as well as productive cough but no wheezing or shortness of breath.  She is wearing 2.5 L of oxygen at home 24/7.  She is eating very well.  Lower extremities are without any swelling. She is not taking Lasix.  She has finished her prednisone and azithromycin but it is not clear exactly what else she is taking at home lately--her boyfriend is in charge of dispensing her medications and has a list that he forgot to bring this morning so we cannot 100% verify what she has been taking lately.  ROS as above, plus--> no fevers, no CP, no dizziness, no HAs, no rashes, no melena/hematochezia.  No polyuria or polydipsia.  No myalgias or arthralgias.  No focal weakness, paresthesias, or tremors.  No acute vision or hearing abnormalities.  No dysuria or unusual/new urinary urgency or frequency.   No n/v/d  or abd pain.  No palpitations.    PMH:  Past Medical History:  Diagnosis Date   Allergic rhinitis    with upper airway cough: as of 07/2017 pulm f/u she was instructed to use astelin, flonase, and saline more consistently   BPPV (benign paroxysmal positional vertigo) 03/2018   C. difficile colitis    Chest pain, non-cardiac    Cardiac CT showed no signif obstructive dz, mildly elevated calcium level (Dr. [name]).   Chronic cystitis with hematuria    [name]   Chronic hypoxemic respiratory failure ([place]) 02/02/2015   2L oxygen 24/7   Chronic renal insufficiency, stage 2 (mild)    GFR approx 60   Cold hands    NCS/EMGs normal.  Hand dysesthesias per neuro--reassured   COPD (chronic obstructive pulmonary disease) ([place])    GOLD 4.  spirometry 01/10/09 FEV 0.97(52%), FEV1% 47.  With chronic bronchitis as of 07/2017.   DDD (degenerative disc disease), cervical    1998 MRI C5-6 impingemt (left).  MRI 2021 (neurol) 1 level canal stenosis, 1 level foraminal stenosis--->PT   DDD (degenerative disc disease), lumbar    Depression with anxiety 04/15/2011   Diarrheal disease Summer 2017   ? 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).  As of 10/05/15 pt reports GI dx'd her with C diff and rx'd more flagyl and she is also on cholestyramine.  As of 02/2016, pt's sx's resolved completely.   Diverticulosis of colon    Procto '97 and colonoscopy 2002   Dysplastic nevus of upper extremity 04/2014   R tricep ([name])   Edema of both lower extremities due to peripheral  venous insufficiency    Varicosities bilat, swelling L>R.  R baker's cyst.  Got vein clinic eval 02/2018->sclerotherapy recommended but since no hemorrhage or ulceration, insurance will not cover the procedure.   Family history of adverse reaction to anesthesia    mother died when patient age 71 receiving anesthesia for thyroid  surgery   Fibrocystic breast disease    w/fibroadenoma.  Bx's showed NO atypia (Dr. Margot Chimes).  Mammo neg 2008.   GERD (gastroesophageal reflux disease)    History of double vision    Ophthalmologist, Dr. Herbert Deaner, is further evaluating this with MRI  orbits and limited brain (myesthenia gravis testing neg 07/2010)   History of home oxygen therapy    uses 2 liters at hs   Hypertension    EKG 03/2010 normal   Hypothyroidism    Hashimoto's, dx'd 1989   Idiopathic peripheral neuropathy 04/2018   Sensory (numb feet) S1 distribution bilat, c/w spinal stenosis sensory neuropathy (no pain). 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  Labs [place], MRI C spine->some spondylosis/stenosis.  UE NCS/EMS: normal 04/2019.   Lesion of right native kidney 2017   found on CT 02/2015.  F/u MRI 03/31/15 showed it to be smaller and less likely of any significance but repeat CT renal protocol in 8mowas recommended by radiology.  This was done 10/26/15 and showed no interval change in the lesion, but b/c the lesion enhances with contrast, radiol rec'd urol consult (b/c pap renal RCC could not be excluded).  Saw urol 03/06/16--stable on u/s 09/2016 and 09/2017.   OAB (overactive bladder)    Osteoarthritis, hip, bilateral    she is s/p bilat THA as of 02/2018.   Osteoporosis    radius, T score -2.5->rec'd alendronate 08/2019->plan rpt DEXA 08/2021.   Pneumonia    Postmenopausal atrophic vaginitis    improved with estrace 2 X/week.   Prolapse of vaginal cuff after hysterectomy    10/2018: Dr. FWayne Both Mild colpocele 04/2019 per GYN (no surgery)   Pulmonary nodule    CT chest 12/10, June 2011.  No nodule on CT 06/2010.   Recurrent Clostridium difficile diarrhea    Episode 09/19/2016: resolved with prolonged course of flagyl.  11/2016 recurrence treated with 10d of Dificid (Fidaxomicin) by GI (Dr. MEarlean Shawl.   Recurrent UTI    likely related to pt's atrophic vaginitis.   Urol started vaginal estrace 03/2016.   Rib fracture 09/2018   R 7th, laterally-->sustained when her dog pulled her over and she fell.   RLS (restless legs syndrome)    neuro->gabapentin trial 2020/21   Shingles 02/2014   R abd/flank   Spinal stenosis of lumbar region with neurogenic claudication    2008 MRI (L3-4, L4-5, L5-S1).  +S1 distribution sensory loss bilat    PSH:  Past Surgical History:  Procedure Laterality Date   ABDOMINAL HYSTERECTOMY  1978   no BSO per pt--nonmalignant reasons   APPENDECTOMY     BREAST CYST EXCISION     Last screening mammogram 10/2010 was normal.   CHOLECYSTECTOMY  2001   COLONOSCOPY  04/2005; 12/06/2015   2007 NORMAL.  2017 adenomatous polyp x 1.  Mod sigmoid diverticulosis (Dr. MEarlean Shawl.     DEXA  05/18/2017; 08/7019   2019 NORMAL (T-score 0.1).  08/2019->T score -2.5->alendronate started->plan Rpt 08/2021.   HIP CLOSED REDUCTION Right 12/25/2015   Procedure: CLOSED REDUCTION HIP;  Surgeon: JNicholes Stairs MD;  Location: [facility];  Service: Orthopedics;  Laterality: Right;   NCV/EMS  10/2018   chronic and symmetric sensorimotor axonal polyneuropathy affecting the lower extremities   TONSILLECTOMY     TOTAL HIP ARTHROPLASTY Right 11/01/2014   Procedure: RIGHT TOTAL HIP ARTHROPLASTY;  Surgeon: Latanya Maudlin, MD;  Location: [facility];  Service: Orthopedics;  Laterality: Right;   TOTAL HIP ARTHROPLASTY Left 12/08/2017   Procedure: LEFT TOTAL HIP ARTHROPLASTY ANTERIOR APPROACH;  Surgeon: Paralee Cancel, MD;  Location: [facility];  Service: Orthopedics;  Laterality: Left;  70 mins   TRANSTHORACIC ECHOCARDIOGRAM  08/2014   2016 EF 60-65%, grade I diast dysfxn. 06/21/21 unchanged.   TUBAL LIGATION  1974    MEDS:  Outpatient Medications Prior to Visit  Medication Sig Dispense Refill   albuterol (PROAIR HFA) 108 (90 Base) MCG/ACT inhaler Inhale 2 puffs into the lungs every 4 (four) hours as needed for wheezing or shortness of breath. 8.5 g 1   albuterol  (PROVENTIL) (2.5 MG/3ML) 0.083% nebulizer solution Take 3 mLs (2.5 mg total) by nebulization every 6 (six) hours as needed for wheezing or shortness of breath. 360 mL 5   Artificial Tear Solution (SOOTHE XP) SOLN Place 1 drop into both eyes 2 (two) times daily as needed (irritation).     aspirin EC 81 MG tablet Take 81 mg by mouth daily.     azithromycin (ZITHROMAX) 500 MG tablet Take 1 tablet (500 mg total) by mouth daily. 1 tablet 0   cetirizine (ZYRTEC) 10 MG tablet Take 1 tablet (10 mg total) by mouth daily. (Patient taking differently: Take 10 mg by mouth daily as needed for allergies.) 30 tablet 6   ciprofloxacin (CILOXAN) 0.3 % ophthalmic solution Place 1 drop into the left eye every 4 (four) hours while awake. Administer 1 drop, every 2 hours, while awake, for 2 days. Then 1 drop, every 4 hours, while awake, for the next 5 days. 5 mL 0   clobetasol (TEMOVATE) 0.05 % external solution APPLY TO THE AFFECTED AREA EVERY DAY 50 mL 4   cycloSPORINE (RESTASIS OP) Place 1 drop into both eyes daily as needed (dry eyes).     diazepam (VALIUM) 5 MG tablet TAKE 1 TABLET BY MOUTH DAILY AS NEEDED FOR ANXIETY OR INSOMNIA (Patient taking differently: Take 5 mg by mouth daily as needed for anxiety or sedation. TAKE 1 TABLET BY MOUTH DAILY AS NEEDED FOR ANXIETY OR INSOMNIA) 30 tablet 3   Fluticasone-Umeclidin-Vilant (TRELEGY ELLIPTA) 100-62.5-25 MCG/ACT AEPB Inhale 1 puff into the lungs daily. 180 each 3   levothyroxine (SYNTHROID) 100 MCG tablet Take 1 tablet (100 mcg total) by mouth daily. 90 tablet 1   Lifitegrast (XIIDRA) 5 % SOLN Place 1 drop into both eyes in the morning and at bedtime.     Misc. Devices (PULSE OXIMETER) MISC Use to check heart rate and oxygen daily. 1 each 0   spironolactone (ALDACTONE) 25 MG tablet 1.5 tab po bid (Patient taking differently: Take 37.5 mg by mouth 2 (two) times daily. 1.5 tab po bid) 270 tablet 0   venlafaxine (EFFEXOR) 50 MG tablet Take 1 tablet (50 mg total) by mouth 2  (two) times daily. 180 tablet 3   vitamin B-12 (CYANOCOBALAMIN) 100 MCG tablet Take 100 mcg by mouth daily.     VITAMIN D PO Take 1 tablet by mouth daily.     alendronate (FOSAMAX) 70 MG tablet TAKE 1 TABLET(70 MG) BY MOUTH 1 TIME A WEEK WITH A FULL GLASS OF WATER AND ON AN EMPTY STOMACH (Patient not taking: Reported on 03/03/2022) 12 tablet 3  azelastine (ASTELIN) 0.1 % nasal spray Place 2 sprays into both nostrils 2 (two) times daily. Use in each nostril as directed (Patient not taking: Reported on 01/31/2022) 30 mL 3   fluticasone (CUTIVATE) 0.05 % cream Apply topically 2 (two) times daily. (Patient not taking: Reported on 01/31/2022) 30 g 1   fluticasone (FLONASE) 50 MCG/ACT nasal spray Place 2 sprays into both nostrils daily. (Patient not taking: Reported on 01/31/2022) 16 g 6   furosemide (LASIX) 20 MG tablet 2 tabs qd alt with 1 tab qd (Patient not taking: Reported on 02/23/2022) 90 tablet 1   roflumilast (DALIRESP) 500 MCG TABS tablet Take 1 tablet (500 mcg total) by mouth daily. (Patient not taking: Reported on 02/23/2022) 90 tablet 3   triamcinolone cream (KENALOG) 0.1 % Apply 1 application topically 2 (two) times daily. (Patient not taking: Reported on 01/31/2022) 45 g 2   Vibegron (GEMTESA) 75 MG TABS Take 1 capsule by mouth daily. (Patient not taking: Reported on 01/31/2022) 30 tablet 11   oseltamivir (TAMIFLU) 30 MG capsule Take 1 capsule (30 mg total) by mouth 2 (two) times daily. (Patient not taking: Reported on 03/03/2022) 4 capsule 0   No facility-administered medications prior to visit.    Physical Exam     03/03/2022   11:02 AM 03/03/2022    9:36 AM 02/24/2022    8:06 AM  Vitals with BMI  Height '5\' 0"'$     Weight 115 lbs 6 oz    BMI 09.47    Systolic 096 283 662  Diastolic 75 90 80  Pulse 88  81  02 sat 93% 3L [place]  Gen: alert, well appearing.  Lucid thought and speech. Nose: congestion, no pururlent mucous. CV: RRR, no murmur LUNGS: CTA bilat, symmetric aeration, no  prolongation of exp phase. Non-labored resps. EXT: no clubbing or cyanosis.  no edema.    Pertinent labs/imaging Last CBC Lab Results  Component Value Date   WBC 18.3 (H) 02/23/2022   HGB 12.3 02/23/2022   HCT 37.8 02/23/2022   MCV 91.7 02/23/2022   MCH 29.9 02/23/2022   RDW 13.2 02/23/2022   PLT 252 94/76/5465   Last metabolic panel Lab Results  Component Value Date   GLUCOSE 131 (H) 02/23/2022   NA 138 02/23/2022   K 4.5 02/23/2022   CL 102 02/23/2022   CO2 26 02/23/2022   BUN 23 02/23/2022   CREATININE 0.84 02/23/2022   GFRNONAA >60 02/23/2022   CALCIUM 9.0 02/23/2022   PHOS 3.8 01/31/2013   PROT 6.1 (L) 10/29/2021   ALBUMIN 3.1 (L) 10/29/2021   BILITOT 0.4 10/29/2021   ALKPHOS 58 10/29/2021   AST 18 10/29/2021   ALT 42 10/29/2021   ANIONGAP 10 02/23/2022   Last thyroid functions Lab Results  Component Value Date   TSH 6.73 (H) 07/03/2021   T3TOTAL 97.2 07/11/2010   T4TOTAL 12.4 07/11/2010   ASSESSMENT/PLAN:  Acute on chronic hypoxic respiratory failure due to COPD exacerbation and influenza pneumonia. At this point she is finished prednisone, tamiflu, and azithromycin.  I do not think she needs any extension of her prednisone treatment. We tested her oxygen on room air and after 1 minute on room air it had maintained in the low 80s to upper 80s.  We put her back on 2 L of oxygen and she maintained sat greater than 90%. I encouraged an occasional attempt at turning her oxygen down at home, maintain sats greater than 90%. Her boyfriend will bring by her current med  list from home--he uses a pillbox.  Medical decision making of moderate complexity was utilized today.  FOLLOW UP:  1 wk  Signed:  Crissie Sickles, MD           03/03/2022

## 2022-03-12 ENCOUNTER — Ambulatory Visit: Payer: PRIVATE HEALTH INSURANCE | Admitting: Family Medicine

## 2022-03-12 ENCOUNTER — Encounter: Payer: Self-pay | Admitting: Family Medicine

## 2022-03-12 ENCOUNTER — Ambulatory Visit (HOSPITAL_BASED_OUTPATIENT_CLINIC_OR_DEPARTMENT_OTHER)
Admission: RE | Admit: 2022-03-12 | Discharge: 2022-03-12 | Disposition: A | Discharge status: Home / Self Care | Payer: Medicare PPO | Source: Ambulatory Visit | Attending: Family Medicine | Admitting: Family Medicine

## 2022-03-12 VITALS — BP 125/76 | HR 77 | Temp 97.9°F | Ht 60.0 in | Wt 114.2 lb

## 2022-03-12 DIAGNOSIS — E039 Hypothyroidism, unspecified: Secondary | ICD-10-CM | POA: Diagnosis not present

## 2022-03-12 DIAGNOSIS — M810 Age-related osteoporosis without current pathological fracture: Secondary | ICD-10-CM | POA: Diagnosis not present

## 2022-03-12 DIAGNOSIS — J189 Pneumonia, unspecified organism: Secondary | ICD-10-CM

## 2022-03-12 DIAGNOSIS — J9611 Chronic respiratory failure with hypoxia: Secondary | ICD-10-CM | POA: Diagnosis not present

## 2022-03-12 DIAGNOSIS — J9621 Acute and chronic respiratory failure with hypoxia: Secondary | ICD-10-CM | POA: Diagnosis not present

## 2022-03-12 DIAGNOSIS — Z1231 Encounter for screening mammogram for malignant neoplasm of breast: Secondary | ICD-10-CM

## 2022-03-12 DIAGNOSIS — J441 Chronic obstructive pulmonary disease with (acute) exacerbation: Secondary | ICD-10-CM | POA: Diagnosis not present

## 2022-03-12 DIAGNOSIS — J439 Emphysema, unspecified: Secondary | ICD-10-CM | POA: Diagnosis not present

## 2022-03-12 DIAGNOSIS — R0989 Other specified symptoms and signs involving the circulatory and respiratory systems: Secondary | ICD-10-CM

## 2022-03-12 DIAGNOSIS — J449 Chronic obstructive pulmonary disease, unspecified: Secondary | ICD-10-CM | POA: Diagnosis not present

## 2022-03-12 DIAGNOSIS — S2231XA Fracture of one rib, right side, initial encounter for closed fracture: Secondary | ICD-10-CM | POA: Diagnosis not present

## 2022-03-12 LAB — CBC WITH DIFFERENTIAL/PLATELET
Basophils Absolute: 0 10*3/uL (ref 0.0–0.1)
Basophils Relative: 0.5 % (ref 0.0–3.0)
Eosinophils Absolute: 0.1 10*3/uL (ref 0.0–0.7)
Eosinophils Relative: 1.7 % (ref 0.0–5.0)
HCT: 35.2 % — ABNORMAL LOW (ref 36.0–46.0)
Hemoglobin: 11.8 g/dL — ABNORMAL LOW (ref 12.0–15.0)
Lymphocytes Relative: 17.6 % (ref 12.0–46.0)
Lymphs Abs: 1.4 10*3/uL (ref 0.7–4.0)
MCHC: 33.4 g/dL (ref 30.0–36.0)
MCV: 89.4 fl (ref 78.0–100.0)
Monocytes Absolute: 0.8 10*3/uL (ref 0.1–1.0)
Monocytes Relative: 9.9 % (ref 3.0–12.0)
Neutro Abs: 5.4 10*3/uL (ref 1.4–7.7)
Neutrophils Relative %: 70.3 % (ref 43.0–77.0)
Platelets: 240 10*3/uL (ref 150.0–400.0)
RBC: 3.94 Mil/uL (ref 3.87–5.11)
RDW: 14.4 % (ref 11.5–15.5)
WBC: 7.7 10*3/uL (ref 4.0–10.5)

## 2022-03-12 LAB — VITAMIN D 25 HYDROXY (VIT D DEFICIENCY, FRACTURES): VITD: 27.36 ng/mL — ABNORMAL LOW (ref 30.00–100.00)

## 2022-03-12 LAB — TSH: TSH: 0.03 u[IU]/mL — ABNORMAL LOW (ref 0.35–5.50)

## 2022-03-12 MED ORDER — PREDNISONE 20 MG PO TABS: ORAL_TABLET | ORAL | 0 refills | Status: DC

## 2022-03-12 MED ORDER — PULSE OXIMETER MISC: 1 refills | Status: AC

## 2022-03-12 NOTE — Progress Notes (Signed)
OFFICE VISIT  03/12/2022  CC: f/u pneumonia, hypoxia  Patient is a 87 y.o. female who presents for 1 week follow-up pneumonia, COPD exacerbation, acute on chronic hypoxic respiratory failure. A/P as of last visit: "Acute on chronic hypoxic respiratory failure due to COPD exacerbation and influenza pneumonia. At this point she is finished prednisone, tamiflu, and azithromycin.  I do not think she needs any extension of her prednisone treatment. We tested her oxygen on room air and after 1 minute on room air it had maintained in the low 80s to upper 80s.  We put her back on 2 L of oxygen and she maintained sat greater than 90%. I encouraged an occasional attempt at turning her oxygen down at home, maintain sats greater than 90%. Her boyfriend will bring by her current med list from home--he uses a pillbox."  INTERIM HX: Not much has changed. Still with prominent rattly cough and some intermittent shortness of breath and wheezing. No fever.  She is eating well.  Her activity level is pretty minimal.  ROS as above, plus-->  No polyuria or polydipsia.  No myalgias or arthralgias.  No focal weakness, paresthesias, or tremors.  No acute vision or hearing abnormalities.  No dysuria or unusual/new urinary urgency or frequency.  No recent changes in lower legs. No n/v/d or abd pain.  No palpitations.     Past Medical History:  Diagnosis Date   Allergic rhinitis    with upper airway cough: as of 07/2017 pulm f/u she was instructed to use astelin, flonase, and saline more consistently   BPPV (benign paroxysmal positional vertigo) 03/2018   C. difficile colitis    Chest pain, non-cardiac    Cardiac CT showed no signif obstructive dz, mildly elevated calcium level (Dr. [name]).   Chronic cystitis with hematuria    [name]   Chronic hypoxemic respiratory failure ([place]) 02/02/2015   2L oxygen 24/7   Chronic renal insufficiency, stage 2 (mild)    GFR approx 60   Cold hands     NCS/EMGs normal.  Hand dysesthesias per neuro--reassured   COPD (chronic obstructive pulmonary disease) ([place])    GOLD 4.  spirometry 01/10/09 FEV 0.97(52%), FEV1% 47.  With chronic bronchitis as of 07/2017.   DDD (degenerative disc disease), cervical    1998 MRI C5-6 impingemt (left).  MRI 2021 (neurol) 1 level canal stenosis, 1 level foraminal stenosis--->PT   DDD (degenerative disc disease), lumbar    Depression with anxiety 04/15/2011   Diarrheal disease Summer 2017   ? 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).  As of 10/05/15 pt reports GI dx'd her with C diff and rx'd more flagyl and she is also on cholestyramine.  As of 02/2016, pt's sx's resolved completely.   Diverticulosis of colon    Procto '97 and colonoscopy 2002   Dysplastic nevus of upper extremity 04/2014   R tricep ([name])   Edema of both lower extremities due to peripheral venous insufficiency    Varicosities bilat, swelling L>R.  R baker's cyst.  Got vein clinic eval 02/2018->sclerotherapy recommended but since no hemorrhage or ulceration, insurance will not cover the procedure.   Family history of adverse reaction to anesthesia    mother died when patient age 56 receiving anesthesia for thyroid surgery   Fibrocystic breast disease    w/fibroadenoma.  Bx's showed NO atypia (Dr. Margot Chimes).  Mammo neg 2008.   GERD (gastroesophageal reflux  disease)    History of double vision    Ophthalmologist, Dr. Herbert Deaner, is further evaluating this with MRI  orbits and limited brain (myesthenia gravis testing neg 07/2010)   History of home oxygen therapy    uses 2 liters at hs   Hypertension    EKG 03/2010 normal   Hypothyroidism    Hashimoto's, dx'd 1989   Idiopathic peripheral neuropathy 04/2018   Sensory (numb feet) S1 distribution bilat, c/w spinal stenosis sensory neuropathy (no pain). NCS/EMG 10/2018-> Chronic symmetric sensorimotor  axonal polyneuropathy affecting the lower extremities.  [facility], MRI C spine->some spondylosis/stenosis.  UE NCS/EMS: normal 04/2019.   Lesion of right native kidney 2017   found on CT 02/2015.  F/u MRI 03/31/15 showed it to be smaller and less likely of any significance but repeat CT renal protocol in [place] recommended by radiology.  This was done 10/26/15 and showed no interval change in the lesion, but b/c the lesion enhances with contrast, radiol rec'd urol consult (b/c pap renal RCC could not be excluded).  Saw urol 03/06/16--stable on u/s 09/2016 and 09/2017.   OAB (overactive bladder)    Osteoarthritis, hip, bilateral    she is s/p bilat THA as of 02/2018.   Osteoporosis    radius, T score -2.5->rec'd alendronate 08/2019->plan rpt DEXA 08/2021.   Pneumonia    Postmenopausal atrophic vaginitis    improved with estrace 2 X/week.   Prolapse of vaginal cuff after hysterectomy    10/2018: Dr. FWayne Both Mild colpocele 04/2019 per GYN (no surgery)   Pulmonary nodule    CT chest 12/10, June 2011.  No nodule on CT 06/2010.   Recurrent Clostridium difficile diarrhea    Episode 09/19/2016: resolved with prolonged course of flagyl.  11/2016 recurrence treated with 10d of Dificid (Fidaxomicin) by GI (Dr. MEarlean Shawl.   Recurrent UTI    likely related to pt's atrophic vaginitis.  Urol started vaginal estrace 03/2016.   Rib fracture 09/2018   R 7th, laterally-->sustained when her dog pulled her over and she fell.   RLS (restless legs syndrome)    neuro->gabapentin trial 2020/21   Shingles 02/2014   R abd/flank   Spinal stenosis of lumbar region with neurogenic claudication    2008 MRI (L3-4, L4-5, L5-S1).  +S1 distribution sensory loss bilat    Past Surgical History:  Procedure Laterality Date   ABDOMINAL HYSTERECTOMY  1978   no BSO per pt--nonmalignant reasons   APPENDECTOMY     BREAST CYST EXCISION     Last screening mammogram 10/2010 was normal.   CHOLECYSTECTOMY  2001   COLONOSCOPY  04/2005;  12/06/2015   2007 NORMAL.  2017 adenomatous polyp x 1.  Mod sigmoid diverticulosis (Dr. MEarlean Shawl.     DEXA  05/18/2017; 08/7019   2019 NORMAL (T-score 0.1).  08/2019->T score -2.5->alendronate started->plan Rpt 08/2021.   HIP CLOSED REDUCTION Right 12/25/2015   Procedure: CLOSED REDUCTION HIP;  Surgeon: JNicholes Stairs MD;  Location: [facility];  Service: Orthopedics;  Laterality: Right;   NCV/EMS  10/2018   chronic and symmetric sensorimotor axonal polyneuropathy affecting the lower extremities   TONSILLECTOMY     TOTAL HIP ARTHROPLASTY Right 11/01/2014   Procedure: RIGHT TOTAL HIP ARTHROPLASTY;  Surgeon: RLatanya Maudlin MD;  Location: [facility];  Service: Orthopedics;  Laterality: Right;   TOTAL HIP ARTHROPLASTY Left 12/08/2017   Procedure: LEFT TOTAL HIP ARTHROPLASTY ANTERIOR APPROACH;  Surgeon: OParalee Cancel MD;  Location: [facility];  Service: Orthopedics;  Laterality: Left;  70 mins  TRANSTHORACIC ECHOCARDIOGRAM  08/2014   2016 EF 60-65%, grade I diast dysfxn. 06/21/21 unchanged.   TUBAL LIGATION  1974    Outpatient Medications Prior to Visit  Medication Sig Dispense Refill   albuterol (PROAIR HFA) 108 (90 Base) MCG/ACT inhaler Inhale 2 puffs into the lungs every 4 (four) hours as needed for wheezing or shortness of breath. 8.5 g 1   albuterol (PROVENTIL) (2.5 MG/3ML) 0.083% nebulizer solution Take 3 mLs (2.5 mg total) by nebulization every 6 (six) hours as needed for wheezing or shortness of breath. 360 mL 5   Artificial Tear Solution (SOOTHE XP) SOLN Place 1 drop into both eyes 2 (two) times daily as needed (irritation).     aspirin EC 81 MG tablet Take 81 mg by mouth daily.     azelastine (ASTELIN) 0.1 % nasal spray Place 2 sprays into both nostrils 2 (two) times daily. Use in each nostril as directed 30 mL 3   cetirizine (ZYRTEC) 10 MG tablet Take 1 tablet (10 mg total) by mouth daily. (Patient taking differently: Take 10 mg by mouth daily as needed for allergies.) 30 tablet 6    ciprofloxacin (CILOXAN) 0.3 % ophthalmic solution Place 1 drop into the left eye every 4 (four) hours while awake. Administer 1 drop, every 2 hours, while awake, for 2 days. Then 1 drop, every 4 hours, while awake, for the next 5 days. 5 mL 0   clobetasol (TEMOVATE) 0.05 % external solution APPLY TO THE AFFECTED AREA EVERY DAY 50 mL 4   cycloSPORINE (RESTASIS OP) Place 1 drop into both eyes daily as needed (dry eyes).     diazepam (VALIUM) 5 MG tablet TAKE 1 TABLET BY MOUTH DAILY AS NEEDED FOR ANXIETY OR INSOMNIA (Patient taking differently: Take 5 mg by mouth daily as needed for anxiety or sedation. TAKE 1 TABLET BY MOUTH DAILY AS NEEDED FOR ANXIETY OR INSOMNIA) 30 tablet 3   Fluticasone-Umeclidin-Vilant (TRELEGY ELLIPTA) 100-62.5-25 MCG/ACT AEPB Inhale 1 puff into the lungs daily. 180 each 3   furosemide (LASIX) 20 MG tablet 1 tab po twice a day as needed for LE swelling 60 tablet 1   levothyroxine (SYNTHROID) 100 MCG tablet Take 1 tablet (100 mcg total) by mouth daily. 90 tablet 1   Lifitegrast (XIIDRA) 5 % SOLN Place 1 drop into both eyes in the morning and at bedtime.     roflumilast (DALIRESP) 500 MCG TABS tablet Take 1 tablet (500 mcg total) by mouth daily. 90 tablet 3   spironolactone (ALDACTONE) 25 MG tablet 1.5 tab po bid (Patient taking differently: Take 37.5 mg by mouth 2 (two) times daily. 1.5 tab po bid) 270 tablet 0   triamcinolone cream (KENALOG) 0.1 % Apply 1 application topically 2 (two) times daily. 45 g 2   venlafaxine (EFFEXOR) 50 MG tablet Take 1 tablet (50 mg total) by mouth 2 (two) times daily. 180 tablet 3   Vibegron (GEMTESA) 75 MG TABS Take 1 capsule by mouth daily. 30 tablet 11   vitamin B-12 (CYANOCOBALAMIN) 100 MCG tablet Take 100 mcg by mouth daily.     VITAMIN D PO Take 1 tablet by mouth daily.     Misc. Devices (PULSE OXIMETER) MISC Use to check heart rate and oxygen daily. 1 each 0   alendronate (FOSAMAX) 70 MG tablet TAKE 1 TABLET(70 MG) BY MOUTH 1 TIME A WEEK WITH  A FULL GLASS OF WATER AND ON AN EMPTY STOMACH (Patient not taking: Reported on 03/03/2022) 12 tablet 3  fluticasone (CUTIVATE) 0.05 % cream Apply topically 2 (two) times daily. (Patient not taking: Reported on 01/31/2022) 30 g 1   fluticasone (FLONASE) 50 MCG/ACT nasal spray Place 2 sprays into both nostrils daily. (Patient not taking: Reported on 01/31/2022) 16 g 6   No facility-administered medications prior to visit.    Allergies  Allergen Reactions   Losartan Other (See Comments)    hyperkalemia   Penicillins Hives, Swelling and Rash    Has patient had a PCN reaction causing immediate rash, facial/tongue/throat swelling, SOB or lightheadedness with hypotension: YES Has patient had a PCN reaction causing severe rash involving mucus membranes or skin necrosis: NO Has patient had a PCN reaction that required hospitalization NO Has patient had a PCN reaction occurring within the last 10 years: NO If all of the above answers are "NO", then may proceed with Cephalosporin use.    Cefixime [Kdc:Cefixime] Other (See Comments)    unspecified   Gabapentin Other (See Comments)    Feels woozy   Indocin [Indomethacin] Other (See Comments)    Painful tongue and throat   Singulair [Montelukast Sodium]     Chest tightness, decreased mental clarity   Sulfa Antibiotics Other (See Comments)    unspecified   Ace Inhibitors Other (See Comments)    cough   Statins Other (See Comments)    Myalgias     Review of Systems As per HPI  PE:    03/12/2022    1:05 PM 03/03/2022   11:02 AM 03/03/2022    9:36 AM  Vitals with BMI  Height '5\' 0"'$  '5\' 0"'$    Weight 114 lbs 3 oz 115 lbs 6 oz   BMI 40.9 81.19   Systolic 147 829 562  Diastolic 76 75 90  Pulse 77 88   02 sat 91% on 2L oxygen   Physical Exam  Gen: Alert, well appearing, smiling.  Patient is oriented to person, place, time, and situation. CV: RRR, no murmur LUNGS: R basilar insp crackles, otherwise clear.  Aeration minimally diminished  diffusely, exp phase minimally prolonged, non-labored resps but mild tachypnea. EXT: no clubbing or cyanosis.  no edema.   LABS:  Last CBC Lab Results  Component Value Date   WBC 18.3 (H) 02/23/2022   HGB 12.3 02/23/2022   HCT 37.8 02/23/2022   MCV 91.7 02/23/2022   MCH 29.9 02/23/2022   RDW 13.2 02/23/2022   PLT 252 13/09/6576   Last metabolic panel Lab Results  Component Value Date   GLUCOSE 131 (H) 02/23/2022   NA 138 02/23/2022   K 4.5 02/23/2022   CL 102 02/23/2022   CO2 26 02/23/2022   BUN 23 02/23/2022   CREATININE 0.84 02/23/2022   GFRNONAA >60 02/23/2022   CALCIUM 9.0 02/23/2022   PHOS 3.8 01/31/2013   PROT 6.1 (L) 10/29/2021   ALBUMIN 3.1 (L) 10/29/2021   BILITOT 0.4 10/29/2021   ALKPHOS 58 10/29/2021   AST 18 10/29/2021   ALT 42 10/29/2021   ANIONGAP 10 02/23/2022   Last thyroid functions Lab Results  Component Value Date   TSH 6.73 (H) 07/03/2021   T3TOTAL 97.2 07/11/2010   T4TOTAL 12.4 07/11/2010     IMPRESSION AND PLAN:  #1 acute on chronic hypoxic respiratory failure due to COPD and recent influenza pneumonia. She has been gradually making some improvement but seems to have stalled the last 4 to 5 days. Lung exam suspicious for right lung infiltrate.  Clinically she does not appear very ill, though.  Therefore, we  will hold off on antibiotics and see if chest x-ray correlates. Will get her back on prednisone: 40 mg a day x 5 days, then 20 mg a day x 5 days. Continue supplemental oxygen to keep sat greater than or equal to 90%.  #2 hypothyroidism. Takes 100 mcg T4 daily on empty stomach w/out any other meds. Due for TSH monitoring today.  #3 osteoporosis, on alendronate x 2 yrs, due for rpt DEXA-->ordered. Check vit D level today  #4 preventative health care: Due for screening mammogram->ordered today.  An After Visit Summary was printed and given to the patient.  FOLLOW UP: Return for 7-10d f/u copd.  Signed:  Crissie Sickles, MD            03/12/2022

## 2022-03-13 ENCOUNTER — Telehealth: Payer: Self-pay

## 2022-03-13 NOTE — Telephone Encounter (Signed)
LM for pt to return call regarding results.  

## 2022-03-13 NOTE — Telephone Encounter (Signed)
Please review results.

## 2022-03-13 NOTE — Telephone Encounter (Signed)
Please call patient back as she returned call.

## 2022-03-13 NOTE — Telephone Encounter (Signed)
LVM for pt to call back in regards to scheduling AWV with our health coach.   03/13/2022

## 2022-03-13 NOTE — Telephone Encounter (Signed)
Patient called back regarding xray results.  I informed her of Dr. Isla Pence' findings and recommendations.  (Copied and pasted notes from xray) "Please call patient and reassure her that there is no pneumonia on the x-ray. No antibiotics are needed."  Patient has no further questions or concerns at this time. Confirmed appt for next week on 2/14

## 2022-03-13 NOTE — Telephone Encounter (Signed)
Patient has another result to be discussed. Will follow up with patient to discuss

## 2022-03-13 NOTE — Telephone Encounter (Signed)
Patient calling about xray results from yesterday.  I told patient once Dr. Anitra Lauth has reviewed them, someone will call her.  Patient can be reached at [phone]

## 2022-03-14 DIAGNOSIS — Z96641 Presence of right artificial hip joint: Secondary | ICD-10-CM | POA: Diagnosis not present

## 2022-03-14 DIAGNOSIS — J984 Other disorders of lung: Secondary | ICD-10-CM | POA: Diagnosis not present

## 2022-03-14 DIAGNOSIS — R0902 Hypoxemia: Secondary | ICD-10-CM | POA: Diagnosis not present

## 2022-03-14 DIAGNOSIS — J449 Chronic obstructive pulmonary disease, unspecified: Secondary | ICD-10-CM | POA: Diagnosis not present

## 2022-03-14 NOTE — Telephone Encounter (Signed)
LVM for pt to return call

## 2022-03-19 ENCOUNTER — Ambulatory Visit: Payer: Medicare PPO | Admitting: Family Medicine

## 2022-03-19 ENCOUNTER — Encounter: Payer: Self-pay | Admitting: Family Medicine

## 2022-03-19 ENCOUNTER — Ambulatory Visit (INDEPENDENT_AMBULATORY_CARE_PROVIDER_SITE_OTHER): Payer: Medicare PPO

## 2022-03-19 VITALS — BP 119/64 | HR 67 | Wt 117.6 lb

## 2022-03-19 DIAGNOSIS — Z0000 Encounter for general adult medical examination without abnormal findings: Secondary | ICD-10-CM | POA: Diagnosis not present

## 2022-03-19 DIAGNOSIS — E039 Hypothyroidism, unspecified: Secondary | ICD-10-CM | POA: Diagnosis not present

## 2022-03-19 DIAGNOSIS — R4189 Other symptoms and signs involving cognitive functions and awareness: Secondary | ICD-10-CM

## 2022-03-19 DIAGNOSIS — R5381 Other malaise: Secondary | ICD-10-CM | POA: Diagnosis not present

## 2022-03-19 DIAGNOSIS — R7989 Other specified abnormal findings of blood chemistry: Secondary | ICD-10-CM

## 2022-03-19 DIAGNOSIS — J9621 Acute and chronic respiratory failure with hypoxia: Secondary | ICD-10-CM

## 2022-03-19 DIAGNOSIS — J449 Chronic obstructive pulmonary disease, unspecified: Secondary | ICD-10-CM

## 2022-03-19 MED ORDER — LEVOTHYROXINE SODIUM 100 MCG PO TABS: ORAL_TABLET | ORAL | 1 refills | Status: DC

## 2022-03-19 NOTE — Patient Instructions (Addendum)
Take only 1/2 of your levothyroxine tab on two days a week, take whole tab on all other days of the week.  Take 2000 units of vitamin D every day (over the counter).

## 2022-03-19 NOTE — Progress Notes (Signed)
OFFICE VISIT  03/19/2022  CC:  Chief Complaint  Patient presents with   Medical Management of Chronic Issues    Patient is a 87 y.o. female who presents for 1 week follow-up COPD, acute on chronic hypoxic respiratory failure. A/P as of last visit: "#1 acute on chronic hypoxic respiratory failure due to COPD and recent influenza pneumonia. She has been gradually making some improvement but seems to have stalled the last 4 to 5 days. Lung exam suspicious for right lung infiltrate.  Clinically she does not appear very ill, though.  Therefore, we will hold off on antibiotics and see if chest x-ray correlates. Will get her back on prednisone: 40 mg a day x 5 days, then 20 mg a day x 5 days. Continue supplemental oxygen to keep sat greater than or equal to 90%.   #2 hypothyroidism. Takes 100 mcg T4 daily on empty stomach w/out any other meds. Due for TSH monitoring today.   #3 osteoporosis, on alendronate x 2 yrs, due for rpt DEXA-->ordered. Check vit D level today   #4 preventative health care: Due for screening mammogram->ordered today."  INTERIM HX: Chest x-ray last visit showed no acute abnormality. TSH was low, recommended she decrease T4 to 1/2 tab on 2d/week, cont whole tab all other days (100 mcg tabs). Messages were left with these results's and recommendations but patient states she did not get them.  Overall she feels significantly improved.  Denies shortness of breath.  She does not move around much, though.  Some residual coughing but not much sputum production.  No wheezing, no chest pain, no fever. No lower extremity edema.  She has not been using Lasix. She is wearing 2 L O2 nasal cannula 24/7.  When she does take it off sometimes her oxygen drops into the 80s. She is eating well, drinking well.  Urinating fine and bowels fine.  She is having some more forgetfulness than before she got ill. No focal weakness.  Past Medical History:  Diagnosis Date   Allergic  rhinitis    with upper airway cough: as of 07/2017 pulm f/u she was instructed to use astelin, flonase, and saline more consistently   BPPV (benign paroxysmal positional vertigo) 03/2018   C. difficile colitis    Chest pain, non-cardiac    Cardiac CT showed no signif obstructive dz, mildly elevated calcium level (Dr. [name]).   Chronic cystitis with hematuria    [name]   Chronic hypoxemic respiratory failure ([place]) 02/02/2015   2L oxygen 24/7   Chronic renal insufficiency, stage 2 (mild)    GFR approx 60   Cold hands    NCS/EMGs normal.  Hand dysesthesias per neuro--reassured   COPD (chronic obstructive pulmonary disease) ([place])    GOLD 4.  spirometry 01/10/09 FEV 0.97(52%), FEV1% 47.  With chronic bronchitis as of 07/2017.   DDD (degenerative disc disease), cervical    1998 MRI C5-6 impingemt (left).  MRI 2021 (neurol) 1 level canal stenosis, 1 level foraminal stenosis--->PT   DDD (degenerative disc disease), lumbar    Depression with anxiety 04/15/2011   Diarrheal disease Summer 2017   ? 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).  As of 10/05/15 pt reports GI dx'd her with C diff and rx'd more flagyl and she is also on cholestyramine.  As of 02/2016, pt's sx's resolved completely.   Diverticulosis of colon    Procto '97 and colonoscopy 2002  Dysplastic nevus of upper extremity 04/2014   R tricep ([name])   Edema of both lower extremities due to peripheral venous insufficiency    Varicosities bilat, swelling L>R.  R baker's cyst.  Got vein clinic eval 02/2018->sclerotherapy recommended but since no hemorrhage or ulceration, insurance will not cover the procedure.   Family history of adverse reaction to anesthesia    mother died when patient age 24 receiving anesthesia for thyroid surgery   Fibrocystic breast disease    w/fibroadenoma.  Bx's showed NO atypia (Dr. Margot Chimes).   Mammo neg 2008.   GERD (gastroesophageal reflux disease)    History of double vision    Ophthalmologist, Dr. Herbert Deaner, is further evaluating this with MRI  orbits and limited brain (myesthenia gravis testing neg 07/2010)   History of home oxygen therapy    uses 2 liters at hs   Hypertension    EKG 03/2010 normal   Hypothyroidism    Hashimoto's, dx'd 1989   Idiopathic peripheral neuropathy 04/2018   Sensory (numb feet) S1 distribution bilat, c/w spinal stenosis sensory neuropathy (no pain). 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  Labs [place], MRI C spine->some spondylosis/stenosis.  UE NCS/EMS: normal 04/2019.   Lesion of right native kidney 2017   found on CT 02/2015.  F/u MRI 03/31/15 showed it to be smaller and less likely of any significance but repeat CT renal protocol in [place] recommended by radiology.  This was done 10/26/15 and showed no interval change in the lesion, but b/c the lesion enhances with contrast, radiol rec'd urol consult (b/c pap renal RCC could not be excluded).  Saw urol 03/06/16--stable on u/s 09/2016 and 09/2017.   OAB (overactive bladder)    Osteoarthritis, hip, bilateral    she is s/p bilat THA as of 02/2018.   Osteoporosis    radius, T score -2.5->rec'd alendronate 08/2019->plan rpt DEXA 08/2021.   Pneumonia    Postmenopausal atrophic vaginitis    improved with estrace 2 X/week.   Prolapse of vaginal cuff after hysterectomy    10/2018: Dr. FWayne Both Mild colpocele 04/2019 per GYN (no surgery)   Pulmonary nodule    CT chest 12/10, June 2011.  No nodule on CT 06/2010.   Recurrent Clostridium difficile diarrhea    Episode 09/19/2016: resolved with prolonged course of flagyl.  11/2016 recurrence treated with 10d of Dificid (Fidaxomicin) by GI (Dr. MEarlean Shawl.   Recurrent UTI    likely related to pt's atrophic vaginitis.  Urol started vaginal estrace 03/2016.   Rib fracture 09/2018   R 7th, laterally-->sustained when  her dog pulled her over and she fell.   RLS (restless legs syndrome)    neuro->gabapentin trial 2020/21   Shingles 02/2014   R abd/flank   Spinal stenosis of lumbar region with neurogenic claudication    2008 MRI (L3-4, L4-5, L5-S1).  +S1 distribution sensory loss bilat    Past Surgical History:  Procedure Laterality Date   ABDOMINAL HYSTERECTOMY  1978   no BSO per pt--nonmalignant reasons   APPENDECTOMY     BREAST CYST EXCISION     Last screening mammogram 10/2010 was normal.   CHOLECYSTECTOMY  2001   COLONOSCOPY  04/2005; 12/06/2015   2007 NORMAL.  2017 adenomatous polyp x 1.  Mod sigmoid diverticulosis (Dr. MEarlean Shawl.     DEXA  05/18/2017; 08/7019   2019 NORMAL (T-score 0.1).  08/2019->T score -2.5->alendronate started->plan Rpt 08/2021.   HIP CLOSED REDUCTION Right 12/25/2015   Procedure: CLOSED REDUCTION  HIP;  Surgeon: Nicholes Stairs, MD;  Location: [facility];  Service: Orthopedics;  Laterality: Right;   NCV/EMS  10/2018   chronic and symmetric sensorimotor axonal polyneuropathy affecting the lower extremities   TONSILLECTOMY     TOTAL HIP ARTHROPLASTY Right 11/01/2014   Procedure: RIGHT TOTAL HIP ARTHROPLASTY;  Surgeon: Latanya Maudlin, MD;  Location: [facility];  Service: Orthopedics;  Laterality: Right;   TOTAL HIP ARTHROPLASTY Left 12/08/2017   Procedure: LEFT TOTAL HIP ARTHROPLASTY ANTERIOR APPROACH;  Surgeon: Paralee Cancel, MD;  Location: [facility];  Service: Orthopedics;  Laterality: Left;  70 mins   TRANSTHORACIC ECHOCARDIOGRAM  08/2014   2016 EF 60-65%, grade I diast dysfxn. 06/21/21 unchanged.   TUBAL LIGATION  1974    Outpatient Medications Prior to Visit  Medication Sig Dispense Refill   albuterol (PROAIR HFA) 108 (90 Base) MCG/ACT inhaler Inhale 2 puffs into the lungs every 4 (four) hours as needed for wheezing or shortness of breath. 8.5 g 1   albuterol (PROVENTIL) (2.5 MG/3ML) 0.083% nebulizer solution Take 3 mLs (2.5 mg total) by nebulization every 6 (six) hours as needed  for wheezing or shortness of breath. 360 mL 5   alendronate (FOSAMAX) 70 MG tablet TAKE 1 TABLET(70 MG) BY MOUTH 1 TIME A WEEK WITH A FULL GLASS OF WATER AND ON AN EMPTY STOMACH 12 tablet 3   Artificial Tear Solution (SOOTHE XP) SOLN Place 1 drop into both eyes 2 (two) times daily as needed (irritation).     aspirin EC 81 MG tablet Take 81 mg by mouth daily.     azelastine (ASTELIN) 0.1 % nasal spray Place 2 sprays into both nostrils 2 (two) times daily. Use in each nostril as directed 30 mL 3   cetirizine (ZYRTEC) 10 MG tablet Take 1 tablet (10 mg total) by mouth daily. (Patient taking differently: Take 10 mg by mouth daily as needed for allergies.) 30 tablet 6   ciprofloxacin (CILOXAN) 0.3 % ophthalmic solution Place 1 drop into the left eye every 4 (four) hours while awake. Administer 1 drop, every 2 hours, while awake, for 2 days. Then 1 drop, every 4 hours, while awake, for the next 5 days. 5 mL 0   clobetasol (TEMOVATE) 0.05 % external solution APPLY TO THE AFFECTED AREA EVERY DAY 50 mL 4   cycloSPORINE (RESTASIS OP) Place 1 drop into both eyes daily as needed (dry eyes).     diazepam (VALIUM) 5 MG tablet TAKE 1 TABLET BY MOUTH DAILY AS NEEDED FOR ANXIETY OR INSOMNIA (Patient taking differently: Take 5 mg by mouth daily as needed for anxiety or sedation. TAKE 1 TABLET BY MOUTH DAILY AS NEEDED FOR ANXIETY OR INSOMNIA) 30 tablet 3   fluticasone (CUTIVATE) 0.05 % cream Apply topically 2 (two) times daily. 30 g 1   fluticasone (FLONASE) 50 MCG/ACT nasal spray Place 2 sprays into both nostrils daily. 16 g 6   Fluticasone-Umeclidin-Vilant (TRELEGY ELLIPTA) 100-62.5-25 MCG/ACT AEPB Inhale 1 puff into the lungs daily. 180 each 3   furosemide (LASIX) 20 MG tablet 1 tab po twice a day as needed for LE swelling 60 tablet 1   Lifitegrast (XIIDRA) 5 % SOLN Place 1 drop into both eyes in the morning and at bedtime.     Misc. Devices (PULSE OXIMETER) MISC Check oxygen level as needed 1 each 1   roflumilast  (DALIRESP) 500 MCG TABS tablet Take 1 tablet (500 mcg total) by mouth daily. 90 tablet 3   spironolactone (ALDACTONE) 25 MG tablet  1.5 tab po bid (Patient taking differently: Take 37.5 mg by mouth 2 (two) times daily. 1.5 tab po bid) 270 tablet 0   triamcinolone cream (KENALOG) 0.1 % Apply 1 application topically 2 (two) times daily. 45 g 2   venlafaxine (EFFEXOR) 50 MG tablet Take 1 tablet (50 mg total) by mouth 2 (two) times daily. 180 tablet 3   Vibegron (GEMTESA) 75 MG TABS Take 1 capsule by mouth daily. 30 tablet 11   vitamin B-12 (CYANOCOBALAMIN) 100 MCG tablet Take 100 mcg by mouth daily.     VITAMIN D PO Take 1 tablet by mouth daily.     levothyroxine (SYNTHROID) 100 MCG tablet Take 1 tablet (100 mcg total) by mouth daily. 90 tablet 1   predniSONE (DELTASONE) 20 MG tablet 2 tabs po qd x 5d then 1 tab po qd x 5d 15 tablet 0   No facility-administered medications prior to visit.    Allergies  Allergen Reactions   Losartan Other (See Comments)    hyperkalemia   Penicillins Hives, Swelling and Rash    Has patient had a PCN reaction causing immediate rash, facial/tongue/throat swelling, SOB or lightheadedness with hypotension: YES Has patient had a PCN reaction causing severe rash involving mucus membranes or skin necrosis: NO Has patient had a PCN reaction that required hospitalization NO Has patient had a PCN reaction occurring within the last 10 years: NO If all of the above answers are "NO", then may proceed with Cephalosporin use.    Cefixime [Kdc:Cefixime] Other (See Comments)    unspecified   Gabapentin Other (See Comments)    Feels woozy   Indocin [Indomethacin] Other (See Comments)    Painful tongue and throat   Singulair [Montelukast Sodium]     Chest tightness, decreased mental clarity   Sulfa Antibiotics Other (See Comments)    unspecified   Ace Inhibitors Other (See Comments)    cough   Statins Other (See Comments)    Myalgias     Review of Systems As per  HPI  PE:    03/12/2022    1:05 PM 03/03/2022   11:02 AM 03/03/2022    9:36 AM  Vitals with BMI  Height 5' 0"$  5' 0"$    Weight 114 lbs 3 oz 115 lbs 6 oz   BMI [id number]   Systolic 0000000 [id number]  Diastolic 76 75 90  Pulse 77 88   2L oxygen [place]  Physical Exam  Gen: Alert, well appearing.  Patient is oriented to person, place, time, and situation. AFFECT: pleasant, lucid thought and speech. [id number]: no injection, icteris, swelling, or exudate.  EOMI, PERRLA. Mouth: lips without lesion/swelling.  Oral mucosa pink and moist. Oropharynx without erythema, exudate, or swelling.  CV: RRR, no m/r/g.   LUNGS: CTA bilat, nonlabored resps, good aeration in all lung fields. EXT: no clubbing or cyanosis.  no edema.  Neuro: CN 2-12 intact bilaterally, strength 5/5 in proximal and distal upper extremities and lower extremities bilaterally.    Fine bilat hand tremor when arms held outstretched. No ataxia.   No pronator drift.   LABS:  Last CBC Lab Results  Component Value Date   WBC 7.7 03/12/2022   HGB 11.8 (L) 03/12/2022   HCT 35.2 (L) 03/12/2022   MCV 89.4 03/12/2022   MCH 29.9 02/23/2022   RDW 14.4 03/12/2022   PLT 240.0 [id number]   Last metabolic panel Lab Results  Component Value Date   GLUCOSE 131 (H) 02/23/2022   NA 138 02/23/2022  K 4.5 02/23/2022   CL 102 02/23/2022   CO2 26 02/23/2022   BUN 23 02/23/2022   CREATININE 0.84 02/23/2022   GFRNONAA >60 02/23/2022   CALCIUM 9.0 02/23/2022   PHOS 3.8 01/31/2013   PROT 6.1 (L) 10/29/2021   ALBUMIN 3.1 (L) 10/29/2021   BILITOT 0.4 10/29/2021   ALKPHOS 58 10/29/2021   AST 18 10/29/2021   ALT 42 10/29/2021   ANIONGAP 10 02/23/2022   Lab Results  Component Value Date   TSH 0.03 (L) 03/12/2022   Last vitamin D Lab Results  Component Value Date   VD25OH 27.36 (L) 03/12/2022   IMPRESSION AND PLAN:  #1 COPD, chronic hypoxic respiratory failure. Continue 2 L O2 24/7, she will finish the last couple days of prednisone,  continue Trelegy Ellipta 1 puff daily, Daliresp 500 mcg daily, and albuterol every 6 hours as needed.    #2 debilitation.  Still requiring assistance with everything except bathing and toileting.   Nutrition improving significantly.  (Pre-illness baseline wt 120-125 lbs, current weight 117 here.)  #3 hypothyroidism. Recent TSH low. Patient did not get message about adjusting dose. Discussed dose adjustment today-->Take only 1/2 of your levothyroxine tab on two days a week, take whole tab on all other days of the week.  #4 vitamin D deficiency: Make sure you take 2000 units of vitamin D every day (over the counter). An After Visit Summary was printed and given to the patient.  FOLLOW UP: Return in about 4 weeks (around 04/16/2022) for routine chronic illness f/u.  Signed:  Crissie Sickles, MD           03/19/2022

## 2022-03-19 NOTE — Progress Notes (Unsigned)
Subjective:   Priscilla Cantrell is a 87 y.o. female who presents for Medicare Annual (Subsequent) preventive examination.  Review of Systems    Defer to PCP Cardiac Risk Factors include: advanced age (>83mn, >>75women);sedentary lifestyle     Objective:    There were no vitals filed for this visit. There is no height or weight on file to calculate BMI.     03/19/2022    2:05 PM 02/21/2022    8:30 PM 02/21/2022    3:58 PM 10/29/2021    5:45 PM 10/25/2021   10:45 PM 09/28/2021   12:32 PM 02/22/2021    3:09 PM  Advanced Directives  Does Patient Have a Medical Advance Directive? Yes No Yes No Yes No Yes  [place]    [place]   Does patient want to make changes to medical advance directive?     No - Patient declined    Would patient like information on creating a medical advance directive?  No - Patient declined   No - Patient declined      Current Medications (verified) Outpatient Encounter Medications as of 03/19/2022  Medication Sig   albuterol (PROAIR HFA) 108 (90 Base) MCG/ACT inhaler Inhale 2 puffs into the lungs every 4 (four) hours as needed for wheezing or shortness of breath.   albuterol (PROVENTIL) (2.5 MG/3ML) 0.083% nebulizer solution Take 3 mLs (2.5 mg total) by nebulization every 6 (six) hours as needed for wheezing or shortness of breath.   alendronate (FOSAMAX) 70 MG tablet TAKE 1 TABLET(70 MG) BY MOUTH 1 TIME A WEEK WITH A FULL GLASS OF WATER AND ON AN EMPTY STOMACH   Artificial Tear Solution (SOOTHE XP) SOLN Place 1 drop into both eyes 2 (two) times daily as needed (irritation).   aspirin EC 81 MG tablet Take 81 mg by mouth daily.   azelastine (ASTELIN) 0.1 % nasal spray Place 2 sprays into both nostrils 2 (two) times daily. Use in each nostril as directed   cetirizine (ZYRTEC) 10 MG tablet Take 1 tablet (10 mg total) by mouth daily. (Patient taking differently: Take 10 mg by mouth daily as  needed for allergies.)   ciprofloxacin (CILOXAN) 0.3 % ophthalmic solution Place 1 drop into the left eye every 4 (four) hours while awake. Administer 1 drop, every 2 hours, while awake, for 2 days. Then 1 drop, every 4 hours, while awake, for the next 5 days.   clobetasol (TEMOVATE) 0.05 % external solution APPLY TO THE AFFECTED AREA EVERY DAY   cycloSPORINE (RESTASIS OP) Place 1 drop into both eyes daily as needed (dry eyes).   diazepam (VALIUM) 5 MG tablet TAKE 1 TABLET BY MOUTH DAILY AS NEEDED FOR ANXIETY OR INSOMNIA (Patient taking differently: Take 5 mg by mouth daily as needed for anxiety or sedation. TAKE 1 TABLET BY MOUTH DAILY AS NEEDED FOR ANXIETY OR INSOMNIA)   fluticasone (CUTIVATE) 0.05 % cream Apply topically 2 (two) times daily.   fluticasone (FLONASE) 50 MCG/ACT nasal spray Place 2 sprays into both nostrils daily.   Fluticasone-Umeclidin-Vilant (TRELEGY ELLIPTA) 100-62.5-25 MCG/ACT AEPB Inhale 1 puff into the lungs daily.   furosemide (LASIX) 20 MG tablet 1 tab po twice a day as needed for LE swelling   levothyroxine (SYNTHROID) 100 MCG tablet 1/2 tab po on two days a week, 1 tab on all others   Lifitegrast (XIIDRA) 5 % SOLN Place 1 drop into both eyes in the morning  and at bedtime.   Misc. Devices (PULSE OXIMETER) MISC Check oxygen level as needed   roflumilast (DALIRESP) 500 MCG TABS tablet Take 1 tablet (500 mcg total) by mouth daily.   spironolactone (ALDACTONE) 25 MG tablet 1.5 tab po bid (Patient taking differently: Take 37.5 mg by mouth 2 (two) times daily. 1.5 tab po bid)   triamcinolone cream (KENALOG) 0.1 % Apply 1 application topically 2 (two) times daily.   venlafaxine (EFFEXOR) 50 MG tablet Take 1 tablet (50 mg total) by mouth 2 (two) times daily.   Vibegron (GEMTESA) 75 MG TABS Take 1 capsule by mouth daily.   vitamin B-12 (CYANOCOBALAMIN) 100 MCG tablet Take 100 mcg by mouth daily.   VITAMIN D PO Take 1 tablet by mouth daily.   No facility-administered encounter  medications on file as of 03/19/2022.    Allergies (verified) Losartan, Penicillins, Cefixime [kdc:cefixime], Gabapentin, Indocin [indomethacin], Singulair [montelukast sodium], Sulfa antibiotics, Ace inhibitors, and Statins   History: Past Medical History:  Diagnosis Date   Allergic rhinitis    with upper airway cough: as of 07/2017 pulm f/u she was instructed to use astelin, flonase, and saline more consistently   BPPV (benign paroxysmal positional vertigo) 03/2018   C. difficile colitis    Chest pain, non-cardiac    Cardiac CT showed no signif obstructive dz, mildly elevated calcium level (Dr. [name]).   Chronic cystitis with hematuria    [name]   Chronic hypoxemic respiratory failure ([place]) 02/02/2015   2L oxygen 24/7   Chronic renal insufficiency, stage 2 (mild)    GFR approx 60   Cold hands    NCS/EMGs normal.  Hand dysesthesias per neuro--reassured   COPD (chronic obstructive pulmonary disease) ([place])    GOLD 4.  spirometry 01/10/09 FEV 0.97(52%), FEV1% 47.  With chronic bronchitis as of 07/2017.   DDD (degenerative disc disease), cervical    1998 MRI C5-6 impingemt (left).  MRI 2021 (neurol) 1 level canal stenosis, 1 level foraminal stenosis--->PT   DDD (degenerative disc disease), lumbar    Depression with anxiety 04/15/2011   Diarrheal disease Summer 2017   ? 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).  As of 10/05/15 pt reports GI dx'd her with C diff and rx'd more flagyl and she is also on cholestyramine.  As of 02/2016, pt's sx's resolved completely.   Diverticulosis of colon    Procto '97 and colonoscopy 2002   Dysplastic nevus of upper extremity 04/2014   R tricep ([name])   Edema of both lower extremities due to peripheral venous insufficiency    Varicosities bilat, swelling L>R.  R baker's cyst.  Got vein clinic eval 02/2018->sclerotherapy recommended  but since no hemorrhage or ulceration, insurance will not cover the procedure.   Family history of adverse reaction to anesthesia    mother died when patient age 83 receiving anesthesia for thyroid surgery   Fibrocystic breast disease    w/fibroadenoma.  Bx's showed NO atypia (Dr. Margot Chimes).  Mammo neg 2008.   GERD (gastroesophageal reflux disease)    History of double vision    Ophthalmologist, Dr. Herbert Deaner, is further evaluating this with MRI  orbits and limited brain (myesthenia gravis testing neg 07/2010)   History of home oxygen therapy    uses 2 liters at hs   Hypertension    EKG 03/2010 normal   Hypothyroidism    Hashimoto's, dx'd 1989   Idiopathic peripheral neuropathy  04/2018   Sensory (numb feet) S1 distribution bilat, c/w spinal stenosis sensory neuropathy (no pain). 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  Labs [place], MRI C spine->some spondylosis/stenosis.  UE NCS/EMS: normal 04/2019.   Lesion of right native kidney 2017   found on CT 02/2015.  F/u MRI 03/31/15 showed it to be smaller and less likely of any significance but repeat CT renal protocol in 18mowas recommended by radiology.  This was done 10/26/15 and showed no interval change in the lesion, but b/c the lesion enhances with contrast, radiol rec'd urol consult (b/c pap renal RCC could not be excluded).  Saw urol 03/06/16--stable on u/s 09/2016 and 09/2017.   OAB (overactive bladder)    Osteoarthritis, hip, bilateral    she is s/p bilat THA as of 02/2018.   Osteoporosis    radius, T score -2.5->rec'd alendronate 08/2019->plan rpt DEXA 08/2021.   Pneumonia    Postmenopausal atrophic vaginitis    improved with estrace 2 X/week.   Prolapse of vaginal cuff after hysterectomy    10/2018: Dr. FWayne Both Mild colpocele 04/2019 per GYN (no surgery)   Pulmonary nodule    CT chest 12/10, June 2011.  No nodule on CT 06/2010.   Recurrent Clostridium difficile diarrhea    Episode  09/19/2016: resolved with prolonged course of flagyl.  11/2016 recurrence treated with 10d of Dificid (Fidaxomicin) by GI (Dr. MEarlean Shawl.   Recurrent UTI    likely related to pt's atrophic vaginitis.  Urol started vaginal estrace 03/2016.   Rib fracture 09/2018   R 7th, laterally-->sustained when her dog pulled her over and she fell.   RLS (restless legs syndrome)    neuro->gabapentin trial 2020/21   Shingles 02/2014   R abd/flank   Spinal stenosis of lumbar region with neurogenic claudication    2008 MRI (L3-4, L4-5, L5-S1).  +S1 distribution sensory loss bilat   Past Surgical History:  Procedure Laterality Date   ABDOMINAL HYSTERECTOMY  1978   no BSO per pt--nonmalignant reasons   APPENDECTOMY     BREAST CYST EXCISION     Last screening mammogram 10/2010 was normal.   CHOLECYSTECTOMY  2001   COLONOSCOPY  04/2005; 12/06/2015   2007 NORMAL.  2017 adenomatous polyp x 1.  Mod sigmoid diverticulosis (Dr. MEarlean Shawl.     DEXA  05/18/2017; 08/7019   2019 NORMAL (T-score 0.1).  08/2019->T score -2.5->alendronate started->plan Rpt 08/2021.   HIP CLOSED REDUCTION Right 12/25/2015   Procedure: CLOSED REDUCTION HIP;  Surgeon: JNicholes Stairs MD;  Location: [facility];  Service: Orthopedics;  Laterality: Right;   NCV/EMS  10/2018   chronic and symmetric sensorimotor axonal polyneuropathy affecting the lower extremities   TONSILLECTOMY     TOTAL HIP ARTHROPLASTY Right 11/01/2014   Procedure: RIGHT TOTAL HIP ARTHROPLASTY;  Surgeon: RLatanya Maudlin MD;  Location: [facility];  Service: Orthopedics;  Laterality: Right;   TOTAL HIP ARTHROPLASTY Left 12/08/2017   Procedure: LEFT TOTAL HIP ARTHROPLASTY ANTERIOR APPROACH;  Surgeon: OParalee Cancel MD;  Location: [facility];  Service: Orthopedics;  Laterality: Left;  70 mins   TRANSTHORACIC ECHOCARDIOGRAM  08/2014   2016 EF 60-65%, grade I diast dysfxn. 06/21/21 unchanged.   TUBAL LIGATION  1974   Family History  Problem Relation Age of Onset   Goiter Mother     Psoriasis Father        od liver   Cirrhosis Father    Diabetes Daughter        Type 2  Stroke Maternal Grandfather    Stroke Paternal Grandmother    Hypertension Paternal Grandmother    Hernia Paternal Grandmother    Cancer Paternal Grandfather        lung   Cancer Maternal Aunt        cancer of the stomach   Social History   Socioeconomic History   Marital status: Widowed    Spouse name: Not on file   Number of children: 1   Years of education: Not on file   Highest education level: Not on file  Occupational History   Occupation: SECRETARY    Employer: [employer]  Tobacco Use   Smoking status: Former    Packs/day: 1.50    Years: 34.00    Total pack years: 51.00    Types: Cigarettes    Start date: 1958    Quit date: 02/03/1990    Years since quitting: 32.1   Smokeless tobacco: Never  Vaping Use   Vaping Use: Never used  Substance and Sexual Activity   Alcohol use: Yes    Comment: Rarely   Drug use: No   Sexual activity: Not Currently    Comment: 1st intercourse 36 yo-1 partner  Other Topics Concern   Not on file  Social History Narrative   Widowed since fall 2014   Lives in [place], [place].      Worked as Research officer, trade union, retired 2017.   She has support of her daughter and grandson. She is independent with all care needs and transportation at this time. 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    She reports she is on SSI and receives another separate check    She reports her work hx included working in Engineer, mining for [employer] and volunteered in a [employer]    She reports having a short hair dog for 2 years  but denies noting any allergic reactions   Right handed   Two story home   Social Determinants of Health   Financial Resource Strain: Low Risk  (03/19/2022)   Overall Financial Resource Strain (CARDIA)    Difficulty of Paying Living Expenses:  Not hard at all  Food Insecurity: No Food Insecurity (03/19/2022)   Hunger Vital Sign    Worried About Running Out of Food in the Last Year: Never true    [place] in the Last Year: Never true  Transportation Needs: No Transportation Needs (03/19/2022)   PRAPARE - Hydrologist (Medical): No    Lack of Transportation (Non-Medical): No  Physical Activity: Sufficiently Active (03/19/2022)   Exercise Vital Sign    Days of Exercise per Week: 7 days    Minutes of Exercise per Session: 150+ min  Stress: No Stress Concern Present (03/19/2022)   [place]    Feeling of Stress : Not at all  Social Connections: Moderately Integrated (03/19/2022)   Social Connection and Isolation Panel [NHANES]    Frequency of Communication with Friends and Family: More than three times a week    Frequency of Social Gatherings with Friends and Family: More than three times a week    Attends Religious Services: More than 4 times per year    Active Member of [employer] or Organizations: Yes    Attends Archivist Meetings: More than 4 times per year  Marital Status: Widowed    Tobacco Counseling Counseling given: Not Answered   Clinical Intake:     Pain : No/denies pain     Nutritional Status: BMI of 19-24  Normal Nutritional Risks: None Diabetes: No  How often do you need to have someone help you when you read instructions, pamphlets, or other written materials from your doctor or pharmacy?: 1 - Never What is the last grade level you completed in school?: 12  Diabetic?NO  Interpreter Needed?: No  Information entered by :: Toria C., RMA   Activities of Daily Living    03/19/2022    2:04 PM 02/21/2022    8:30 PM  In your present state of health, do you have any difficulty performing the following activities:  Hearing? 1 0  Vision? 1 0  Difficulty concentrating or making decisions? 1 0   Walking or climbing stairs? 0 1  Comment  c/o SOB with exertion  Dressing or bathing? 1 0  Doing errands, shopping? 1 0  Preparing Food and eating ? N   Using the Toilet? N   In the past six months, have you accidently leaked urine? Y   Do you have problems with loss of bowel control? Y   Managing your Medications? N   Managing your Finances? N   Housekeeping or managing your Housekeeping? N     Patient Care Team: [name], MD as PCP - General [name] Denice Bors, MD as Consulting Physician (Cardiology) Monna Fam, MD as Consulting Physician (Ophthalmology) Chesley Mires, MD as Consulting Physician (Pulmonary Disease) Latanya Maudlin, MD as Consulting Physician (Orthopedic Surgery) McKenzie, [name], MD as Consulting Physician (Urology) Paralee Cancel, MD as Consulting Physician (Orthopedic Surgery) Linus Mako, MD as Consulting Physician (Family Medicine) Lauraine Rinne, NP as Nurse Practitioner (Pulmonary Disease) Alda Berthold, DO as Consulting Physician (Neurology) Fontaine, Belinda Block, MD (Inactive) as Consulting Physician (Gynecology) Janne Napoleon, PA-C (Inactive) (Dermatology) Warren Danes, PA-C as Physician Assistant (Dermatology) Rolene Course, PA-C as Consulting Physician (Gastroenterology) Rolene Course, PA-C as Physician Assistant (Gastroenterology) Lavonna Monarch, MD (Inactive) as Consulting Physician (Dermatology) Alda Berthold, DO as Consulting Physician (Neurology)  Indicate any recent Medical Services you may have received from other than [facility] providers in the past year (date may be approximate).     Assessment:   This is a routine wellness examination for Priscilla Cantrell.  Hearing/Vision screen No results found.  Dietary issues and exercise activities discussed: Current Exercise Habits: The patient does not participate in regular exercise at present, Exercise limited by: None identified   Goals Addressed   None    Depression Screen    03/19/2022    2:10 PM 01/31/2022    3:13 PM 05/06/2021    2:57 PM 02/22/2021    3:15 PM 12/13/2020    4:24 PM 02/01/2020    3:54 PM 01/12/2020   11:36 AM  PHQ 2/9 Scores  PHQ - 2 Score 1 1 0 0 0 1 0  PHQ- 9 Score  2         Fall Risk    01/31/2022    3:01 PM 10/30/2021    2:53 PM 08/26/2021   10:40 AM 08/07/2021    2:59 PM 06/17/2021    3:04 PM  [place] in the past year? 1 1 1 1 1  $ Number falls in past yr: 1 1 1 1 $ 0  Injury with Fall? 1 1  1 1  $ Risk for fall due to :  History of fall(s);Impaired vision Impaired vision  History of fall(s);Impaired mobility History of fall(s)  Follow up Falls evaluation completed Falls evaluation completed  Falls evaluation completed Falls evaluation completed    FALL RISK PREVENTION PERTAINING TO THE HOME:  Any stairs in or around the home? No  If so, are there any without handrails? No  Home free of loose throw rugs in walkways, pet beds, electrical cords, etc? Yes  Adequate lighting in your home to reduce risk of falls? Yes   ASSISTIVE DEVICES UTILIZED TO PREVENT FALLS:  Life alert? Yes  Use of a cane, walker or w/c? No  Grab bars in the bathroom? Yes  Shower chair or bench in shower? Yes  Elevated toilet seat or a handicapped toilet? Yes   TIMED UP AND GO:  Was the test performed? Yes .  Length of time to ambulate 10 feet: *** sec.   {Appearance of [id number]  Cognitive Function:        03/19/2022    1:56 PM 02/22/2021    3:20 PM  6CIT Screen  What Year? 0 points 0 points  What month? 0 points 0 points  What time? 0 points 0 points  Count back from 20 0 points 0 points  Months in reverse 0 points 0 points  Repeat phrase 0 points 0 points  Total Score 0 points 0 points    Immunizations Immunization History  Administered Date(s) Administered   Fluad Quad(high Dose 65+) 10/05/2018, 10/25/2019, 12/07/2020   Influenza Split 11/04/2010, 10/23/2011   Influenza Whole 10/04/2009   Influenza,  High Dose Seasonal PF 11/07/2016, 11/04/2017   Influenza,inj,Quad PF,6+ Mos 10/07/2012, 10/11/2014   Influenza-Unspecified 11/07/2013, 11/01/2015   Moderna Sars-Covid-2 Vaccination 02/14/2019, 03/15/2019, 12/15/2019   PNEUMOCOCCAL CONJUGATE-20 07/17/2020   Pfizer Covid-19 Vaccine Bivalent Booster 49yr & up 12/19/2020   Pneumococcal Conjugate-13 04/29/2013   Pneumococcal Polysaccharide-23 02/03/2005, 11/28/2010, 11/01/2018   Td 02/04/2008, 03/07/2010   Tdap 03/07/2020   Zoster Recombinat (Shingrix) 10/30/2016, 12/29/2016   Zoster, Live 02/10/2013    TDAP status: Up to date  Flu Vaccine status: Up to date  Pneumococcal vaccine status: Up to date  Covid-19 vaccine status: Information provided on how to obtain vaccines.   Qualifies for Shingles Vaccine? Yes   Zostavax completed No   Shingrix Completed?: Yes  Screening Tests Health Maintenance  Topic Date Due   MAMMOGRAM  09/03/2021   COVID-19 Vaccine (5 - 2023-24 season) 03/19/2022 (Originally 10/04/2021)   INFLUENZA VACCINE  05/04/2022 (Originally 09/03/2021)   Medicare Annual Wellness (AWV)  03/20/2023   DTaP/Tdap/Td (4 - Td or Tdap) 03/07/2030   Pneumonia Vaccine 85 Years old  Completed   DEXA SCAN  Completed   Zoster Vaccines- Shingrix  Completed   HPV VACCINES  Aged Out    Health Maintenance  Health Maintenance Due  Topic Date Due   MAMMOGRAM  09/03/2021    Colorectal cancer screening: No longer required.   Mammogram status: Ordered 03/12/22. Pt provided with contact info and advised to call to schedule appt.   Bone Density status: Ordered 03/12/22. Pt provided with contact info and advised to call to schedule appt.  Lung Cancer Screening: (Low Dose CT Chest recommended if Age 85-80years, 30 pack-year currently smoking OR have quit w/in 15years.) does not qualify.   Additional Screening:  Hepatitis C Screening: does not qualify;   Vision Screening: Recommended annual ophthalmology exams for early detection  of glaucoma and other disorders of the eye. Is the patient up to date with  their annual eye exam?  Yes  Who is the provider or what is the name of the office in which the patient attends annual eye exams? Monna Fam, MD  Dental Screening: Recommended annual dental exams for proper oral hygiene  Community Resource Referral / Chronic Care Management: CRR required this visit?  No   CCM required this visit?  No      Plan:     I have personally reviewed and noted the following in the patient's chart:   Medical and social history Use of alcohol, tobacco or illicit drugs  Current medications and supplements including opioid prescriptions. Patient is not currently taking opioid prescriptions. Functional ability and status Nutritional status Physical activity Advanced directives List of other physicians Hospitalizations, surgeries, and ER visits in previous 12 months Vitals Screenings to include cognitive, depression, and falls Referrals and appointments  In addition, I have reviewed and discussed with patient certain preventive protocols, quality metrics, and best practice recommendations. A written personalized care plan for preventive services as well as general preventive health recommendations were provided to patient.     Kavin Leech, [facility]   03/19/2022   Nurse Notes: face to face 20 minutes.  Ms. Estime , Thank you for taking time to come for your Medicare Wellness Visit. I appreciate your ongoing commitment to your health goals. Please review the following plan we discussed and let me know if I can assist you in the future.   These are the goals we discussed:  Goals       patient (pt-stated)      Maintain current health by staying active.         This is a list of the screening recommended for you and due dates:  Health Maintenance  Topic Date Due   Mammogram  09/03/2021   COVID-19 Vaccine (5 - 2023-24 season) 03/19/2022*   Flu Shot  05/04/2022*    Medicare Annual Wellness Visit  03/20/2023   DTaP/Tdap/Td vaccine (4 - Td or Tdap) 03/07/2030   Pneumonia Vaccine  Completed   DEXA scan (bone density measurement)  Completed   Zoster (Shingles) Vaccine  Completed   HPV Vaccine  Aged Out  *Topic was postponed. The date shown is not the original due date.

## 2022-03-21 ENCOUNTER — Telehealth: Payer: Self-pay

## 2022-03-21 NOTE — Telephone Encounter (Signed)
   Telephone encounter was:  Unsuccessful.  03/21/2022 Name: LASHONDRIA SILVERWOOD MRN: [id number] DOB: January 26, 1936  Unsuccessful outbound call made today to assist with:   community resources.  Outreach Attempt:  1st Attempt  A HIPAA compliant voice message was left requesting a return call.  Instructed patient to call back at [phone].  [place] Resource Care Guide   [email or web address]  ?? [email or web address]   Website: [email or web address]  [email or web address]

## 2022-03-24 ENCOUNTER — Telehealth: Payer: Self-pay

## 2022-03-24 NOTE — Telephone Encounter (Signed)
   Telephone encounter was:  Unsuccessful.  03/24/2022 Name: Priscilla Cantrell MRN: [id number] DOB: 1936-01-09  Unsuccessful outbound call made today to assist with:   community resources.  Outreach Attempt:  2nd Attempt  A HIPAA compliant voice message was left requesting a return call.  Instructed patient to call back at [phone].  [place] Resource Care Guide   ??[name]  ?? [id number]   Website: [email or web address]  [email or web address]

## 2022-03-25 ENCOUNTER — Telehealth: Payer: Self-pay

## 2022-03-25 NOTE — Telephone Encounter (Signed)
   Telephone encounter was:  Unsuccessful.  03/25/2022 Name: Priscilla Cantrell MRN: [id number] DOB: 24-Mar-1935  Unsuccessful outbound call made today to assist with:   community resources needed.  Outreach Attempt:  3rd Attempt.  Referral closed unable to contact patient.  A HIPAA compliant voice message was left requesting a return call.  Instructed patient to call back at [phone].  [place] Resource Care Guide   [email or web address]  ?? [id number]   Website: [email or web address]  [email or web address]

## 2022-03-26 ENCOUNTER — Ambulatory Visit (HOSPITAL_BASED_OUTPATIENT_CLINIC_OR_DEPARTMENT_OTHER)
Admission: RE | Admit: 2022-03-26 | Discharge: 2022-03-26 | Disposition: A | Discharge status: Home / Self Care | Payer: Medicare PPO | Source: Ambulatory Visit | Attending: Family Medicine | Admitting: Family Medicine

## 2022-03-26 ENCOUNTER — Encounter: Payer: Self-pay | Admitting: Pulmonary Disease

## 2022-03-26 ENCOUNTER — Ambulatory Visit (INDEPENDENT_AMBULATORY_CARE_PROVIDER_SITE_OTHER): Payer: PRIVATE HEALTH INSURANCE | Admitting: Pulmonary Disease

## 2022-03-26 ENCOUNTER — Encounter (HOSPITAL_BASED_OUTPATIENT_CLINIC_OR_DEPARTMENT_OTHER): Payer: Self-pay

## 2022-03-26 VITALS — BP 124/68 | HR 72 | Wt 116.8 lb

## 2022-03-26 DIAGNOSIS — J4489 Other specified chronic obstructive pulmonary disease: Secondary | ICD-10-CM

## 2022-03-26 DIAGNOSIS — Z1231 Encounter for screening mammogram for malignant neoplasm of breast: Secondary | ICD-10-CM | POA: Insufficient documentation

## 2022-03-26 DIAGNOSIS — J9611 Chronic respiratory failure with hypoxia: Secondary | ICD-10-CM

## 2022-03-26 DIAGNOSIS — M810 Age-related osteoporosis without current pathological fracture: Secondary | ICD-10-CM | POA: Insufficient documentation

## 2022-03-26 DIAGNOSIS — J439 Emphysema, unspecified: Secondary | ICD-10-CM

## 2022-03-26 NOTE — Patient Instructions (Signed)
Check to make sure you have daliresp at home  Mucinex 1200 mg twice per day as needed to help with cough and loosen phlegm  Will call when we get shipment of flutter valves  Follow up in 2 months

## 2022-03-26 NOTE — Progress Notes (Signed)
[facility]  [facility]  Chief Complaint  Patient presents with   Follow-up    Has had some health issues since last seen would like to talk about her breathing and her cough     Constitutional:  BP 124/68   Pulse 72   Wt 116 lb 12.8 oz (53 kg)   SpO2 90% Comment: 2LO2 pulse  BMI 22.81 kg/m   Past Medical History:  Chronic cystitis, DDD, Anxiety, Depression, C diff 2017 and 2018, Diverticulosis, GERD, HTN, Hypothyroidism, PNA, Shingles, Spinal stenosis  Past Surgical History:  She  has a past surgical history that includes Cholecystectomy (2001); Tubal ligation (1974); Abdominal hysterectomy (1978); Breast cyst excision; Appendectomy; Tonsillectomy; Colonoscopy (04/2005; 12/06/2015); transthoracic echocardiogram (08/2014); Total hip arthroplasty (Right, 11/01/2014); Hip Closed Reduction (Right, 12/25/2015); DEXA (05/18/2017; 08/7019); Total hip arthroplasty (Left, 12/08/2017); and NCV/EMS (10/2018).  Brief Summary:  Priscilla Cantrell is a 87 y.o. female former smoker with GOLD 4 COPD, ACE cough, and nocturnal hypoxemia.       Subjective:   She is here with her fiance.  Since her last visit she had RSV and Influenza.  She was admitted to hospital in January.  Using 4 liters oxygen now.  Still gets confused at times and has  more trouble with her memory.  Having cough and congestion, but difficult to bring up phlegm at times.  Physical Exam:   Appearance - well kempt, wearing oxygen  ENMT - no sinus tenderness, no oral exudate, no LAN, Mallampati 2 airway, no stridor  Respiratory - equal breath sounds bilaterally, no wheezing or rales  CV - s1s2 regular rate and rhythm, no murmurs  Ext - no clubbing, no edema  Skin - no rashes  Psych - normal mood and affect       Pulmonary testing:  Spirometry 01/10/09>>FEV1 0.97(52%), FEV1% 47 March 2012>>Daliresp started RAST 05/04/17 >> negative, IgE 8 Spirometry 05/14/17 >> FEV1 0.91 (55%),  FEV1% 48 PFT 10/29/18 >> FEV1 1.05 (68%), FEV1% 52, TLC 4.29 (96%), DLCO 44%  Sleep Tests:  PSG 11/14/11 >> AHI 0.3  ONO with RA 07/31/16 >> test time 6 hrs 41 min.  Average SpO2 87%, low SpO2 74%.  Spent 4 hrs 13 min with SpO2 < 88%. ONO with 2 liters 07/24/21 >> test time 3 hrs 58 min.  Baseline SpO2 97%, low SpO2 84%.  Spent 19 sec with SpO2 < 88%.  Cardiac Tests:  Echo 06/21/21 >> EF 60 to 65%, grade 1 DD, mild elevation in PASP, mild LA dilation  Social History:  She  reports that she quit smoking about 32 years ago. Her smoking use included cigarettes. She started smoking about 66 years ago. She has a 51.00 pack-year smoking history. She has never used smokeless tobacco. She reports current alcohol use. She reports that she does not use drugs.  Family History:  Her family history includes Cancer in her maternal aunt and paternal grandfather; Cirrhosis in her father; Diabetes in her daughter; Goiter in her mother; Hernia in her paternal grandmother; Hypertension in her paternal grandmother; Psoriasis in her father; Stroke in her maternal grandfather and paternal grandmother.     Discussion:  She had COVID in September and then RSV and Influenza in the past month.  This has taken it's toll on her, and she is needing more oxygen.  She is also having more trouble with memory and confusion.    Assessment/Plan:   GOLD 4 COPD with chronic bronchitis. - continue trelegy  100 one puff daily - she will make sure she is still using daliresp - prn albuterol - she has a nebulizer - will arrange for flutter valve and have her use mucinex to help with expectoration  Long COVID. - from September 2023 - symptoms of fatigue, dyspnea and brain fog persist  Allergic rhinitis with upper airway cough. - prn zyrtec, azelastine, flonase  Laryngopharyngeal reflux. - she is to follow up with GI   Chronic respiratory failure with hypoxia. - goal SpO2 > 90% - using 2 liters at rest and 4 liters with  exertion since she had Influenza  Time Spent Involved in Patient Care on Day of Examination:  37 minutes  Follow up:   Patient Instructions  Check to make sure you have daliresp at home  Mucinex 1200 mg twice per day as needed to help with cough and loosen phlegm  Will call when we get shipment of flutter valves  Follow up in 2 months  Medication List:   Allergies as of 03/26/2022       Reactions   Losartan Other (See Comments)   hyperkalemia   Penicillins Hives, Swelling, Rash   Has patient had a PCN reaction causing immediate rash, facial/tongue/throat swelling, SOB or lightheadedness with hypotension: YES Has patient had a PCN reaction causing severe rash involving mucus membranes or skin necrosis: NO Has patient had a PCN reaction that required hospitalization NO Has patient had a PCN reaction occurring within the last 10 years: NO If all of the above answers are "NO", then may proceed with Cephalosporin use.   Cefixime [kdc:cefixime] Other (See Comments)   unspecified   Gabapentin Other (See Comments)   Feels woozy   Indocin [indomethacin] Other (See Comments)   Painful tongue and throat   Singulair [montelukast Sodium]    Chest tightness, decreased mental clarity   Sulfa Antibiotics Other (See Comments)   unspecified   Ace Inhibitors Other (See Comments)   cough   Statins Other (See Comments)   Myalgias        Medication List        Accurate as of March 26, 2022  1:16 PM. If you have any questions, ask your nurse or doctor.          albuterol (2.5 MG/3ML) 0.083% nebulizer solution Commonly known as: PROVENTIL Take 3 mLs (2.5 mg total) by nebulization every 6 (six) hours as needed for wheezing or shortness of breath.   albuterol 108 (90 Base) MCG/ACT inhaler Commonly known as: ProAir HFA Inhale 2 puffs into the lungs every 4 (four) hours as needed for wheezing or shortness of breath.   alendronate 70 MG tablet Commonly known as: FOSAMAX TAKE 1  TABLET(70 MG) BY MOUTH 1 TIME A WEEK WITH A FULL GLASS OF WATER AND ON AN EMPTY STOMACH   aspirin EC 81 MG tablet Take 81 mg by mouth daily.   azelastine 0.1 % nasal spray Commonly known as: ASTELIN Place 2 sprays into both nostrils 2 (two) times daily. Use in each nostril as directed   cetirizine 10 MG tablet Commonly known as: ZYRTEC Take 1 tablet (10 mg total) by mouth daily. What changed:  when to take this reasons to take this   ciprofloxacin 0.3 % ophthalmic solution Commonly known as: Ciloxan Place 1 drop into the left eye every 4 (four) hours while awake. Administer 1 drop, every 2 hours, while awake, for 2 days. Then 1 drop, every 4 hours, while awake, for the next 5 days.  clobetasol 0.05 % external solution Commonly known as: TEMOVATE APPLY TO THE AFFECTED AREA EVERY DAY   diazepam 5 MG tablet Commonly known as: VALIUM TAKE 1 TABLET BY MOUTH DAILY AS NEEDED FOR ANXIETY OR INSOMNIA What changed:  how much to take how to take this when to take this reasons to take this   fluticasone 0.05 % cream Commonly known as: CUTIVATE Apply topically 2 (two) times daily.   fluticasone 50 MCG/ACT nasal spray Commonly known as: FLONASE Place 2 sprays into both nostrils daily.   furosemide 20 MG tablet Commonly known as: LASIX 1 tab po twice a day as needed for LE swelling   Gemtesa 75 MG Tabs Generic drug: Vibegron Take 1 capsule by mouth daily.   levothyroxine 100 MCG tablet Commonly known as: SYNTHROID 1/2 tab po on two days a week, 1 tab on all others   Pulse Oximeter Misc Check oxygen level as needed   RESTASIS OP Place 1 drop into both eyes daily as needed (dry eyes).   roflumilast 500 MCG Tabs tablet Commonly known as: DALIRESP Take 1 tablet (500 mcg total) by mouth daily.   Soothe XP Soln Place 1 drop into both eyes 2 (two) times daily as needed (irritation).   spironolactone 25 MG tablet Commonly known as: ALDACTONE 1.5 tab po bid What changed:   how much to take how to take this when to take this   Trelegy Ellipta 100-62.5-25 MCG/ACT Aepb Generic drug: Fluticasone-Umeclidin-Vilant Inhale 1 puff into the lungs daily.   triamcinolone cream 0.1 % Commonly known as: KENALOG Apply 1 application topically 2 (two) times daily.   venlafaxine 50 MG tablet Commonly known as: EFFEXOR Take 1 tablet (50 mg total) by mouth 2 (two) times daily.   vitamin B-12 100 MCG tablet Commonly known as: CYANOCOBALAMIN Take 100 mcg by mouth daily.   VITAMIN D PO Take 1 tablet by mouth daily.   Xiidra 5 % Soln Generic drug: Lifitegrast Place 1 drop into both eyes in the morning and at bedtime.        Signature:  Chesley Mires, MD [place] Pager - [phone] 03/26/2022, 1:16 PM

## 2022-04-11 ENCOUNTER — Ambulatory Visit (INDEPENDENT_AMBULATORY_CARE_PROVIDER_SITE_OTHER): Payer: PRIVATE HEALTH INSURANCE | Admitting: Family Medicine

## 2022-04-11 ENCOUNTER — Encounter: Payer: Self-pay | Admitting: Family Medicine

## 2022-04-11 VITALS — BP 146/74 | HR 66 | Temp 98.0°F | Ht 60.0 in | Wt 114.0 lb

## 2022-04-11 DIAGNOSIS — G3184 Mild cognitive impairment, so stated: Secondary | ICD-10-CM | POA: Diagnosis not present

## 2022-04-11 DIAGNOSIS — J441 Chronic obstructive pulmonary disease with (acute) exacerbation: Secondary | ICD-10-CM | POA: Diagnosis not present

## 2022-04-11 DIAGNOSIS — J9611 Chronic respiratory failure with hypoxia: Secondary | ICD-10-CM

## 2022-04-11 MED ORDER — PREDNISONE 20 MG PO TABS: ORAL_TABLET | ORAL | 0 refills | Status: DC

## 2022-04-11 MED ORDER — AZITHROMYCIN 250 MG PO TABS: ORAL_TABLET | ORAL | 0 refills | Status: DC

## 2022-04-11 NOTE — Progress Notes (Signed)
OFFICE VISIT  04/11/2022  CC:  Chief Complaint  Patient presents with   Cough    X 1 week; productive with thick creamy, milky white phlegm; some wheezing; denies chest congestion; no otc meds used.    Patient is a 87 y.o. female who presents accompanied by her significant other [name] for cough.  HPI: About 1 week of increased cough, wheeze, and sputum production. No headache, sore throat, body aches, fever, or significant malaise. Has been taking albuterol about once a day but none today so far. Oxygenating normal as long as she wears her nasal cannula.  She saw [name] 03/26/2022 for follow-up COPD and chronic respiratory failure with hypoxia. No medication changes were made.  Goal SpO2 greater than 90%-->2L at rest, 4 L with exertion.  Flutter valve provided. Long COVID suspected (since 10/2021).  Past Medical History:  Diagnosis Date   Allergic rhinitis    with upper airway cough: as of 07/2017 pulm f/u she was instructed to use astelin, flonase, and saline more consistently   BPPV (benign paroxysmal positional vertigo) 03/2018   C. difficile colitis    Chest pain, non-cardiac    Cardiac CT showed no signif obstructive dz, mildly elevated calcium level (Dr. [name]).   Chronic cystitis with hematuria    [name]   Chronic hypoxemic respiratory failure ([place]) 02/02/2015   2L oxygen 24/7   Chronic renal insufficiency, stage 2 (mild)    GFR approx 60   Cold hands    NCS/EMGs normal.  Hand dysesthesias per neuro--reassured   COPD (chronic obstructive pulmonary disease) ([place])    GOLD 4.  spirometry 01/10/09 FEV 0.97(52%), FEV1% 47.  With chronic bronchitis as of 07/2017.   DDD (degenerative disc disease), cervical    1998 MRI C5-6 impingemt (left).  MRI 2021 (neurol) 1 level canal stenosis, 1 level foraminal stenosis--->PT   DDD (degenerative disc disease), lumbar    Depression with anxiety 04/15/2011   Diarrheal disease Summer 2017   ? 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).  As of 10/05/15 pt reports GI dx'd her with C diff and rx'd more flagyl and she is also on cholestyramine.  As of 02/2016, pt's sx's resolved completely.   Diverticulosis of colon    Procto '97 and colonoscopy 2002   Dysplastic nevus of upper extremity 04/2014   R tricep ([name])   Edema of both lower extremities due to peripheral venous insufficiency    Varicosities bilat, swelling L>R.  R baker's cyst.  Got vein clinic eval 02/2018->sclerotherapy recommended but since no hemorrhage or ulceration, insurance will not cover the procedure.   Family history of adverse reaction to anesthesia    mother died when patient age 8 receiving anesthesia for thyroid surgery   Fibrocystic breast disease    w/fibroadenoma.  Bx's showed NO atypia (Dr. Margot Chimes).  Mammo neg 2008.   GERD (gastroesophageal reflux disease)    History of double vision    Ophthalmologist, Dr. Herbert Deaner, is further evaluating this with MRI  orbits and limited brain (myesthenia gravis testing neg 07/2010)   History of home oxygen therapy    uses 2 liters at hs   Hypertension    EKG 03/2010 normal   Hypothyroidism    Hashimoto's, dx'd 1989   Idiopathic peripheral neuropathy 04/2018   Sensory (numb feet) S1 distribution bilat, c/w spinal stenosis sensory neuropathy (no pain). 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  Labs [place], MRI C spine->some spondylosis/stenosis.  UE NCS/EMS: normal 04/2019.   Lesion of right native kidney 2017   found on CT 02/2015.  F/u MRI 03/31/15 showed it to be smaller and less likely of any significance but repeat CT renal protocol in [place] recommended by radiology.  This was done 10/26/15 and showed no interval change in the lesion, but b/c the lesion enhances with contrast, radiol rec'd urol consult (b/c pap renal RCC could not be  excluded).  Saw urol 03/06/16--stable on u/s 09/2016 and 09/2017.   OAB (overactive bladder)    Osteoarthritis, hip, bilateral    she is s/p bilat THA as of 02/2018.   Osteoporosis    radius, T score -2.5->rec'd alendronate 08/2019->plan rpt DEXA 08/2021.   Pneumonia    Postmenopausal atrophic vaginitis    improved with estrace 2 X/week.   Prolapse of vaginal cuff after hysterectomy    10/2018: Dr. FWayne Both Mild colpocele 04/2019 per GYN (no surgery)   Pulmonary nodule    CT chest 12/10, June 2011.  No nodule on CT 06/2010.   Recurrent Clostridium difficile diarrhea    Episode 09/19/2016: resolved with prolonged course of flagyl.  11/2016 recurrence treated with 10d of Dificid (Fidaxomicin) by GI (Dr. MEarlean Shawl.   Recurrent UTI    likely related to pt's atrophic vaginitis.  Urol started vaginal estrace 03/2016.   Rib fracture 09/2018   R 7th, laterally-->sustained when her dog pulled her over and she fell.   RLS (restless legs syndrome)    neuro->gabapentin trial 2020/21   Shingles 02/2014   R abd/flank   Spinal stenosis of lumbar region with neurogenic claudication    2008 MRI (L3-4, L4-5, L5-S1).  +S1 distribution sensory loss bilat    Past Surgical History:  Procedure Laterality Date   ABDOMINAL HYSTERECTOMY  1978   no BSO per pt--nonmalignant reasons   APPENDECTOMY     BREAST CYST EXCISION     Last screening mammogram 10/2010 was normal.   CHOLECYSTECTOMY  2001   COLONOSCOPY  04/2005; 12/06/2015   2007 NORMAL.  2017 adenomatous polyp x 1.  Mod sigmoid diverticulosis (Dr. MEarlean Shawl.     DEXA  05/18/2017; 08/7019   2019 NORMAL (T-score 0.1).  08/2019->T score -2.5->alendronate started->plan Rpt 08/2021.  03/2022 T score -2.7   HIP CLOSED REDUCTION Right 12/25/2015   Procedure: CLOSED REDUCTION HIP;  Surgeon: JNicholes Stairs MD;  Location: [facility];  Service: Orthopedics;  Laterality: Right;   NCV/EMS  10/2018   chronic and symmetric sensorimotor axonal polyneuropathy affecting the lower  extremities   TONSILLECTOMY     TOTAL HIP ARTHROPLASTY Right 11/01/2014   Procedure: RIGHT TOTAL HIP ARTHROPLASTY;  Surgeon: RLatanya Maudlin MD;  Location: [facility];  Service: Orthopedics;  Laterality: Right;   TOTAL HIP ARTHROPLASTY Left 12/08/2017   Procedure: LEFT TOTAL HIP ARTHROPLASTY ANTERIOR APPROACH;  Surgeon: OParalee Cancel MD;  Location: [facility];  Service: Orthopedics;  Laterality: Left;  70 mins   TRANSTHORACIC ECHOCARDIOGRAM  08/2014   2016 EF 60-65%, grade I diast dysfxn. 06/21/21 unchanged.   TUBAL LIGATION  1974    Outpatient Medications Prior to Visit  Medication Sig Dispense Refill   albuterol (PROAIR HFA) 108 (90 Base) MCG/ACT inhaler Inhale 2 puffs into the lungs every 4 (four) hours as needed for wheezing or shortness of breath. 8.5 g 1   albuterol (PROVENTIL) (2.5 MG/3ML) 0.083% nebulizer solution Take 3 mLs (2.5 mg total) by nebulization every 6 (  six) hours as needed for wheezing or shortness of breath. 360 mL 5   alendronate (FOSAMAX) 70 MG tablet TAKE 1 TABLET(70 MG) BY MOUTH 1 TIME A WEEK WITH A FULL GLASS OF WATER AND ON AN EMPTY STOMACH 12 tablet 3   Artificial Tear Solution (SOOTHE XP) SOLN Place 1 drop into both eyes 2 (two) times daily as needed (irritation).     aspirin EC 81 MG tablet Take 81 mg by mouth daily.     azelastine (ASTELIN) 0.1 % nasal spray Place 2 sprays into both nostrils 2 (two) times daily. Use in each nostril as directed 30 mL 3   cetirizine (ZYRTEC) 10 MG tablet Take 1 tablet (10 mg total) by mouth daily. (Patient taking differently: Take 10 mg by mouth daily as needed for allergies.) 30 tablet 6   ciprofloxacin (CILOXAN) 0.3 % ophthalmic solution Place 1 drop into the left eye every 4 (four) hours while awake. Administer 1 drop, every 2 hours, while awake, for 2 days. Then 1 drop, every 4 hours, while awake, for the next 5 days. 5 mL 0   clobetasol (TEMOVATE) 0.05 % external solution APPLY TO THE AFFECTED AREA EVERY DAY 50 mL 4   cycloSPORINE  (RESTASIS OP) Place 1 drop into both eyes daily as needed (dry eyes).     diazepam (VALIUM) 5 MG tablet TAKE 1 TABLET BY MOUTH DAILY AS NEEDED FOR ANXIETY OR INSOMNIA (Patient taking differently: Take 5 mg by mouth daily as needed for anxiety or sedation. TAKE 1 TABLET BY MOUTH DAILY AS NEEDED FOR ANXIETY OR INSOMNIA) 30 tablet 3   fluticasone (CUTIVATE) 0.05 % cream Apply topically 2 (two) times daily. 30 g 1   fluticasone (FLONASE) 50 MCG/ACT nasal spray Place 2 sprays into both nostrils daily. 16 g 6   Fluticasone-Umeclidin-Vilant (TRELEGY ELLIPTA) 100-62.5-25 MCG/ACT AEPB Inhale 1 puff into the lungs daily. 180 each 3   furosemide (LASIX) 20 MG tablet 1 tab po twice a day as needed for LE swelling 60 tablet 1   levothyroxine (SYNTHROID) 100 MCG tablet 1/2 tab po on two days a week, 1 tab on all others 90 tablet 1   Lifitegrast (XIIDRA) 5 % SOLN Place 1 drop into both eyes in the morning and at bedtime.     Misc. Devices (PULSE OXIMETER) MISC Check oxygen level as needed 1 each 1   roflumilast (DALIRESP) 500 MCG TABS tablet Take 1 tablet (500 mcg total) by mouth daily. 90 tablet 3   spironolactone (ALDACTONE) 25 MG tablet 1.5 tab po bid (Patient taking differently: Take 37.5 mg by mouth 2 (two) times daily. 1.5 tab po bid) 270 tablet 0   triamcinolone cream (KENALOG) 0.1 % Apply 1 application topically 2 (two) times daily. 45 g 2   venlafaxine (EFFEXOR) 50 MG tablet Take 1 tablet (50 mg total) by mouth 2 (two) times daily. 180 tablet 3   Vibegron (GEMTESA) 75 MG TABS Take 1 capsule by mouth daily. 30 tablet 11   vitamin B-12 (CYANOCOBALAMIN) 100 MCG tablet Take 100 mcg by mouth daily.     VITAMIN D PO Take 1 tablet by mouth daily.     No facility-administered medications prior to visit.    Allergies  Allergen Reactions   Losartan Other (See Comments)    hyperkalemia   Penicillins Hives, Swelling and Rash    Has patient had a PCN reaction causing immediate rash, facial/tongue/throat  swelling, SOB or lightheadedness with hypotension: YES Has patient had a PCN  reaction causing severe rash involving mucus membranes or skin necrosis: NO Has patient had a PCN reaction that required hospitalization NO Has patient had a PCN reaction occurring within the last 10 years: NO If all of the above answers are "NO", then may proceed with Cephalosporin use.    Cefixime [Kdc:Cefixime] Other (See Comments)    unspecified   Gabapentin Other (See Comments)    Feels woozy   Indocin [Indomethacin] Other (See Comments)    Painful tongue and throat   Singulair [Montelukast Sodium]     Chest tightness, decreased mental clarity   Sulfa Antibiotics Other (See Comments)    unspecified   Ace Inhibitors Other (See Comments)    cough   Statins Other (See Comments)    Myalgias     Review of Systems  As per HPI  PE:    04/11/2022   11:10 AM 04/11/2022   11:01 AM 03/26/2022   12:19 PM  Vitals with BMI  Height  '5\' 0"'$    Weight  114 lbs 116 lbs 13 oz  BMI  [id number]  Systolic [id number]  Diastolic 74 73 68  Pulse  66 72  02 sat 93% on 2L Rpt bp 146/74  Physical Exam  Gen: Alert, well appearing.  Patient is oriented to person, place, time, and situation. AFFECT: pleasant, lucid thought and speech. CV: RRR, occ ectopy, no murmur LUNGS: CTA bilat, mild prolongation of expiratory phase.  Good aeration.  Nonlabored respirations. EXT: no clubbing or cyanosis.  no edema.   LABS:  Last CBC Lab Results  Component Value Date   WBC 7.7 03/12/2022   HGB 11.8 (L) 03/12/2022   HCT 35.2 (L) 03/12/2022   MCV 89.4 03/12/2022   MCH 29.9 02/23/2022   RDW 14.4 03/12/2022   PLT 240.0 03/12/2022   Lab Results  Component Value Date   FERRITIN 68.9 [id number]   Last metabolic panel Lab Results  Component Value Date   GLUCOSE 131 (H) 02/23/2022   NA 138 02/23/2022   K 4.5 02/23/2022   CL 102 02/23/2022   CO2 26 02/23/2022   BUN 23 02/23/2022   CREATININE 0.84 02/23/2022   GFRNONAA  >60 02/23/2022   CALCIUM 9.0 02/23/2022   PHOS 3.8 01/31/2013   PROT 6.1 (L) 10/29/2021   ALBUMIN 3.1 (L) 10/29/2021   BILITOT 0.4 10/29/2021   ALKPHOS 58 10/29/2021   AST 18 10/29/2021   ALT 42 10/29/2021   ANIONGAP 10 02/23/2022   Lab Results  Component Value Date   VITAMINB12 314 10/12/2018   IMPRESSION AND PLAN:  #1 COPD exacerbation in the setting of chronic hypoxic respiratory failure. Her symptoms were relatively mild. Prednisone 40 mg a day x 5 days plus Z-Pak prescribed. Continue albuterol every 6 hours as needed as well as Trelegy ellipta and Daliresp. Continue oxygen supplementation, goal saturation greater than 90%.  #2 mild cognitive impairment. She is getting to the point where she should not drive.  I firmly recommended today that she stop driving.  She expressed understanding.  An After Visit Summary was printed and given to the patient.  FOLLOW UP: Return in about 5 days (around 04/16/2022) for f/u copd [facility].  Signed:  Crissie Sickles, MD           04/11/2022

## 2022-04-12 DIAGNOSIS — J984 Other disorders of lung: Secondary | ICD-10-CM | POA: Diagnosis not present

## 2022-04-12 DIAGNOSIS — R0902 Hypoxemia: Secondary | ICD-10-CM | POA: Diagnosis not present

## 2022-04-12 DIAGNOSIS — J449 Chronic obstructive pulmonary disease, unspecified: Secondary | ICD-10-CM | POA: Diagnosis not present

## 2022-04-12 DIAGNOSIS — Z96641 Presence of right artificial hip joint: Secondary | ICD-10-CM | POA: Diagnosis not present

## 2022-04-16 ENCOUNTER — Ambulatory Visit: Payer: PRIVATE HEALTH INSURANCE | Admitting: Family Medicine

## 2022-04-16 ENCOUNTER — Encounter: Payer: Self-pay | Admitting: Family Medicine

## 2022-04-16 VITALS — BP 150/70 | HR 71 | Temp 98.0°F | Ht 60.0 in | Wt 115.2 lb

## 2022-04-16 DIAGNOSIS — G3184 Mild cognitive impairment, so stated: Secondary | ICD-10-CM | POA: Diagnosis not present

## 2022-04-16 DIAGNOSIS — J441 Chronic obstructive pulmonary disease with (acute) exacerbation: Secondary | ICD-10-CM | POA: Diagnosis not present

## 2022-04-16 DIAGNOSIS — J9611 Chronic respiratory failure with hypoxia: Secondary | ICD-10-CM

## 2022-04-16 NOTE — Progress Notes (Signed)
OFFICE VISIT  04/16/2022  CC:  Chief Complaint  Patient presents with   Medical Management of Chronic Issues    Patient is a 87 y.o. female who presents for 5d follow-up COPD exacerbation and hypothyroidism. I last saw her 5 days ago. A/P as of that visit: "#1 COPD exacerbation in the setting of chronic hypoxic respiratory failure. Her symptoms were relatively mild. Prednisone 40 mg a day x 5 days plus Z-Pak prescribed. Continue albuterol every 6 hours as needed as well as Trelegy ellipta and Daliresp. Continue oxygen supplementation, goal saturation greater than 90%.   #2 mild cognitive impairment. She is getting to the point where she should not drive.  I firmly recommended today that she stop driving.  She expressed understanding."  INTERIM HX: Feeling better, less cough, less mucous. No fever. No SOB at rest.  General activity/walking does make her somewhat fatigued and SOB.  Resting 4 min-->all back to baseline. Wearing 2-3 L oxygen 24/7.  Gets some confusion in most evenings---recently tried cooking something and it made no sense, also turned stove on after BF had turned it off. No delusions.  No CP, no leg pains or swelling, no HA's, no dizziness, no palpitations.  Past Medical History:  Diagnosis Date   Allergic rhinitis    with upper airway cough: as of 07/2017 pulm f/u she was instructed to use astelin, flonase, and saline more consistently   BPPV (benign paroxysmal positional vertigo) 03/2018   C. difficile colitis    Chest pain, non-cardiac    Cardiac CT showed no signif obstructive dz, mildly elevated calcium level (Dr. [name]).   Chronic cystitis with hematuria    [name]   Chronic hypoxemic respiratory failure ([place]) 02/02/2015   2L oxygen 24/7   Chronic renal insufficiency, stage 2 (mild)    GFR approx 60   Cold hands    NCS/EMGs normal.  Hand dysesthesias per neuro--reassured   COPD (chronic obstructive pulmonary disease) ([place])    GOLD  4.  spirometry 01/10/09 FEV 0.97(52%), FEV1% 47.  With chronic bronchitis as of 07/2017.   DDD (degenerative disc disease), cervical    1998 MRI C5-6 impingemt (left).  MRI 2021 (neurol) 1 level canal stenosis, 1 level foraminal stenosis--->PT   DDD (degenerative disc disease), lumbar    Depression with anxiety 04/15/2011   Diarrheal disease Summer 2017   ? 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).  As of 10/05/15 pt reports GI dx'd her with C diff and rx'd more flagyl and she is also on cholestyramine.  As of 02/2016, pt's sx's resolved completely.   Diverticulosis of colon    Procto '97 and colonoscopy 2002   Dysplastic nevus of upper extremity 04/2014   R tricep ([name])   Edema of both lower extremities due to peripheral venous insufficiency    Varicosities bilat, swelling L>R.  R baker's cyst.  Got vein clinic eval 02/2018->sclerotherapy recommended but since no hemorrhage or ulceration, insurance will not cover the procedure.   Family history of adverse reaction to anesthesia    mother died when patient age 28 receiving anesthesia for thyroid surgery   Fibrocystic breast disease    w/fibroadenoma.  Bx's showed NO atypia (Dr. Margot Chimes).  Mammo neg 2008.   GERD (gastroesophageal reflux disease)    History of double vision    Ophthalmologist, Dr. Herbert Deaner, is further evaluating this with MRI  orbits and limited brain (myesthenia gravis  testing neg 07/2010)   History of home oxygen therapy    uses 2 liters at hs   Hypertension    EKG 03/2010 normal   Hypothyroidism    Hashimoto's, dx'd 1989   Idiopathic peripheral neuropathy 04/2018   Sensory (numb feet) S1 distribution bilat, c/w spinal stenosis sensory neuropathy (no pain). 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  Labs [place], MRI C spine->some spondylosis/stenosis.  UE NCS/EMS:  normal 04/2019.   Lesion of right native kidney 2017   found on CT 02/2015.  F/u MRI 03/31/15 showed it to be smaller and less likely of any significance but repeat CT renal protocol in [place] recommended by radiology.  This was done 10/26/15 and showed no interval change in the lesion, but b/c the lesion enhances with contrast, radiol rec'd urol consult (b/c pap renal RCC could not be excluded).  Saw urol 03/06/16--stable on u/s 09/2016 and 09/2017.   OAB (overactive bladder)    Osteoarthritis, hip, bilateral    she is s/p bilat THA as of 02/2018.   Osteoporosis    radius, T score -2.5->rec'd alendronate 08/2019->plan rpt DEXA 08/2021.   Pneumonia    Postmenopausal atrophic vaginitis    improved with estrace 2 X/week.   Prolapse of vaginal cuff after hysterectomy    10/2018: Dr. FWayne Both Mild colpocele 04/2019 per GYN (no surgery)   Pulmonary nodule    CT chest 12/10, June 2011.  No nodule on CT 06/2010.   Recurrent Clostridium difficile diarrhea    Episode 09/19/2016: resolved with prolonged course of flagyl.  11/2016 recurrence treated with 10d of Dificid (Fidaxomicin) by GI (Dr. MEarlean Shawl.   Recurrent UTI    likely related to pt's atrophic vaginitis.  Urol started vaginal estrace 03/2016.   Rib fracture 09/2018   R 7th, laterally-->sustained when her dog pulled her over and she fell.   RLS (restless legs syndrome)    neuro->gabapentin trial 2020/21   Shingles 02/2014   R abd/flank   Spinal stenosis of lumbar region with neurogenic claudication    2008 MRI (L3-4, L4-5, L5-S1).  +S1 distribution sensory loss bilat    Past Surgical History:  Procedure Laterality Date   ABDOMINAL HYSTERECTOMY  1978   no BSO per pt--nonmalignant reasons   APPENDECTOMY     BREAST CYST EXCISION     Last screening mammogram 10/2010 was normal.   CHOLECYSTECTOMY  2001   COLONOSCOPY  04/2005; 12/06/2015   2007 NORMAL.  2017 adenomatous polyp x 1.  Mod sigmoid diverticulosis (Dr. MEarlean Shawl.     DEXA  05/18/2017; 08/7019    2019 NORMAL (T-score 0.1).  08/2019->T score -2.5->alendronate started->plan Rpt 08/2021.  03/2022 T score -2.7   HIP CLOSED REDUCTION Right 12/25/2015   Procedure: CLOSED REDUCTION HIP;  Surgeon: JNicholes Stairs MD;  Location: [facility];  Service: Orthopedics;  Laterality: Right;   NCV/EMS  10/2018   chronic and symmetric sensorimotor axonal polyneuropathy affecting the lower extremities   TONSILLECTOMY     TOTAL HIP ARTHROPLASTY Right 11/01/2014   Procedure: RIGHT TOTAL HIP ARTHROPLASTY;  Surgeon: RLatanya Maudlin MD;  Location: [facility];  Service: Orthopedics;  Laterality: Right;   TOTAL HIP ARTHROPLASTY Left 12/08/2017   Procedure: LEFT TOTAL HIP ARTHROPLASTY ANTERIOR APPROACH;  Surgeon: OParalee Cancel MD;  Location: [facility];  Service: Orthopedics;  Laterality: Left;  70 mins   TRANSTHORACIC ECHOCARDIOGRAM  08/2014   2016 EF 60-65%, grade I diast dysfxn. 06/21/21 unchanged.   TUBAL LIGATION  1974    Outpatient Medications Prior to Visit  Medication Sig Dispense Refill   albuterol (PROAIR HFA) 108 (90 Base) MCG/ACT inhaler Inhale 2 puffs into the lungs every 4 (four) hours as needed for wheezing or shortness of breath. 8.5 g 1   albuterol (PROVENTIL) (2.5 MG/3ML) 0.083% nebulizer solution Take 3 mLs (2.5 mg total) by nebulization every 6 (six) hours as needed for wheezing or shortness of breath. 360 mL 5   alendronate (FOSAMAX) 70 MG tablet TAKE 1 TABLET(70 MG) BY MOUTH 1 TIME A WEEK WITH A FULL GLASS OF WATER AND ON AN EMPTY STOMACH 12 tablet 3   Artificial Tear Solution (SOOTHE XP) SOLN Place 1 drop into both eyes 2 (two) times daily as needed (irritation).     aspirin EC 81 MG tablet Take 81 mg by mouth daily.     azelastine (ASTELIN) 0.1 % nasal spray Place 2 sprays into both nostrils 2 (two) times daily. Use in each nostril as directed 30 mL 3   cetirizine (ZYRTEC) 10 MG tablet Take 1 tablet (10 mg total) by mouth daily. (Patient taking differently: Take 10 mg by mouth daily as needed for  allergies.) 30 tablet 6   ciprofloxacin (CILOXAN) 0.3 % ophthalmic solution Place 1 drop into the left eye every 4 (four) hours while awake. Administer 1 drop, every 2 hours, while awake, for 2 days. Then 1 drop, every 4 hours, while awake, for the next 5 days. 5 mL 0   clobetasol (TEMOVATE) 0.05 % external solution APPLY TO THE AFFECTED AREA EVERY DAY 50 mL 4   cycloSPORINE (RESTASIS OP) Place 1 drop into both eyes daily as needed (dry eyes).     diazepam (VALIUM) 5 MG tablet TAKE 1 TABLET BY MOUTH DAILY AS NEEDED FOR ANXIETY OR INSOMNIA (Patient taking differently: Take 5 mg by mouth daily as needed for anxiety or sedation. TAKE 1 TABLET BY MOUTH DAILY AS NEEDED FOR ANXIETY OR INSOMNIA) 30 tablet 3   fluticasone (CUTIVATE) 0.05 % cream Apply topically 2 (two) times daily. 30 g 1   fluticasone (FLONASE) 50 MCG/ACT nasal spray Place 2 sprays into both nostrils daily. 16 g 6   Fluticasone-Umeclidin-Vilant (TRELEGY ELLIPTA) 100-62.5-25 MCG/ACT AEPB Inhale 1 puff into the lungs daily. 180 each 3   furosemide (LASIX) 20 MG tablet 1 tab po twice a day as needed for LE swelling 60 tablet 1   levothyroxine (SYNTHROID) 100 MCG tablet 1/2 tab po on two days a week, 1 tab on all others 90 tablet 1   Lifitegrast (XIIDRA) 5 % SOLN Place 1 drop into both eyes in the morning and at bedtime.     Misc. Devices (PULSE OXIMETER) MISC Check oxygen level as needed 1 each 1   roflumilast (DALIRESP) 500 MCG TABS tablet Take 1 tablet (500 mcg total) by mouth daily. 90 tablet 3   spironolactone (ALDACTONE) 25 MG tablet 1.5 tab po bid (Patient taking differently: Take 37.5 mg by mouth 2 (two) times daily. 1.5 tab po bid) 270 tablet 0   triamcinolone cream (KENALOG) 0.1 % Apply 1 application topically 2 (two) times daily. 45 g 2   venlafaxine (EFFEXOR) 50 MG tablet Take 1 tablet (50 mg total) by mouth 2 (two) times daily. 180 tablet 3   Vibegron (GEMTESA) 75 MG TABS Take 1 capsule by mouth daily. 30 tablet 11   vitamin B-12  (CYANOCOBALAMIN) 100 MCG tablet Take 100 mcg by mouth daily.     VITAMIN D PO Take  1 tablet by mouth daily.     azithromycin (ZITHROMAX) 250 MG tablet 2 tabs po qd x 1d, then 1 tab po qd x 4d (Patient not taking: Reported on 04/16/2022) 6 tablet 0   predniSONE (DELTASONE) 20 MG tablet 2 tabs po qd x 5d (Patient not taking: Reported on 04/16/2022) 10 tablet 0   No facility-administered medications prior to visit.    Allergies  Allergen Reactions   Losartan Other (See Comments)    hyperkalemia   Penicillins Hives, Swelling and Rash    Has patient had a PCN reaction causing immediate rash, facial/tongue/throat swelling, SOB or lightheadedness with hypotension: YES Has patient had a PCN reaction causing severe rash involving mucus membranes or skin necrosis: NO Has patient had a PCN reaction that required hospitalization NO Has patient had a PCN reaction occurring within the last 10 years: NO If all of the above answers are "NO", then may proceed with Cephalosporin use.    Cefixime [Kdc:Cefixime] Other (See Comments)    unspecified   Gabapentin Other (See Comments)    Feels woozy   Indocin [Indomethacin] Other (See Comments)    Painful tongue and throat   Singulair [Montelukast Sodium]     Chest tightness, decreased mental clarity   Sulfa Antibiotics Other (See Comments)    unspecified   Ace Inhibitors Other (See Comments)    cough   Statins Other (See Comments)    Myalgias     Review of Systems As per HPI  PE:    04/16/2022    1:39 PM 04/11/2022   11:10 AM 04/11/2022   11:01 AM  Vitals with BMI  Height '5\' 0"'$   '5\' 0"'$   Weight 115 lbs 3 oz  114 lbs  BMI [id number]  [id number]  Systolic [id number]  Diastolic 71 74 73  Pulse 71  66     Physical Exam  Gen: Alert, well appearing.  Patient is oriented to person, place, time, and situation. AFFECT: pleasant, lucid thought and speech. CV: RRR, frequent ectopy, no r/g.   LUNGS: CTA bilat, nonlabored resps, good aeration in all lung  fields. EXT: no clubbing or cyanosis.  no edema.    LABS:  Last CBC Lab Results  Component Value Date   WBC 7.7 03/12/2022   HGB 11.8 (L) 03/12/2022   HCT 35.2 (L) 03/12/2022   MCV 89.4 03/12/2022   MCH 29.9 02/23/2022   RDW 14.4 03/12/2022   PLT 240.0 [id number]   Last metabolic panel Lab Results  Component Value Date   GLUCOSE 131 (H) 02/23/2022   NA 138 02/23/2022   K 4.5 02/23/2022   CL 102 02/23/2022   CO2 26 02/23/2022   BUN 23 02/23/2022   CREATININE 0.84 02/23/2022   GFRNONAA >60 02/23/2022   CALCIUM 9.0 02/23/2022   PHOS 3.8 01/31/2013   PROT 6.1 (L) 10/29/2021   ALBUMIN 3.1 (L) 10/29/2021   BILITOT 0.4 10/29/2021   ALKPHOS 58 10/29/2021   AST 18 10/29/2021   ALT 42 10/29/2021   ANIONGAP 10 02/23/2022   Last thyroid functions Lab Results  Component Value Date   TSH 0.03 (L) 03/12/2022   T3TOTAL 97.2 07/11/2010   T4TOTAL 12.4 07/11/2010   Last vitamin D Lab Results  Component Value Date   VD25OH 27.36 (L) 03/12/2022   IMPRESSION AND PLAN:  #1 chronic hypoxic respiratory failure.  Recent superimposed COPD exacerbation. She has back to baseline.  Oxygenation on room air today was 92% or better consistently.  After ambulation  about 50 steps she was still at 90% but when she sat and rested she was breathing heavily and oxygen dropped down to 84% for about 1 minute.  She was never in any distress, though. Okay to go without oxygen at home as long as she is resting in daytime hours.  Use 2 L as needed activity.  Also use 2 L overnight. Continue albuterol every 6 hours as needed as well as Trelegy ellipta and Daliresp.   #2 cognitive impairment with memory loss/early dementia.  Suspect vascular plus Alzheimer's. No driving.  She is not to be left alone in the evening hours.  #3 hypothyroidism, most recent TSH a little bit low. Decrease levothyroxine to one half tab on 2 days a week and continue to a whole tablet all other days. Plan repeat TSH at next  follow-up in 2 weeks.  An After Visit Summary was printed and given to the patient.  FOLLOW UP: Return in about 2 weeks (around 04/30/2022) for f/u copd/hypoxia.  Signed:  Crissie Sickles, MD           04/16/2022

## 2022-04-17 ENCOUNTER — Other Ambulatory Visit: Payer: Self-pay | Admitting: *Deleted

## 2022-04-17 MED ORDER — TRELEGY ELLIPTA 100-62.5-25 MCG/ACT IN AEPB: 1.0000 | INHALATION_SPRAY | Freq: Every day | RESPIRATORY_TRACT | 3 refills | Status: DC

## 2022-04-23 ENCOUNTER — Other Ambulatory Visit: Payer: Self-pay

## 2022-04-23 MED ORDER — VENLAFAXINE HCL 50 MG PO TABS: 50.0000 mg | ORAL_TABLET | Freq: Two times a day (BID) | ORAL | 1 refills | Status: DC

## 2022-04-30 ENCOUNTER — Other Ambulatory Visit: Payer: Self-pay | Admitting: Family Medicine

## 2022-05-01 ENCOUNTER — Ambulatory Visit: Payer: PRIVATE HEALTH INSURANCE | Admitting: Family Medicine

## 2022-05-01 NOTE — Progress Notes (Deleted)
OFFICE VISIT  05/01/2022  CC: No chief complaint on file.   Patient is a 87 y.o. female who presents for 2 wk f/u  A/P as of last visit: "#1 chronic hypoxic respiratory failure.  Recent superimposed COPD exacerbation. She has back to baseline.  Oxygenation on room air today was 92% or better consistently.  After ambulation about 50 steps she was still at 90% but when she sat and rested she was breathing heavily and oxygen dropped down to 84% for about 1 minute.  She was never in any distress, though. Okay to go without oxygen at home as long as she is resting in daytime hours.  Use 2 L as needed activity.  Also use 2 L overnight. Continue albuterol every 6 hours as needed as well as Trelegy ellipta and Daliresp.     #2 cognitive impairment with memory loss/early dementia.  Suspect vascular plus Alzheimer's. No driving.  She is not to be left alone in the evening hours.   #3 hypothyroidism, most recent TSH a little bit low. Decrease levothyroxine to one half tab on 2 days a week and continue to a whole tablet all other days. Plan repeat TSH at next follow-up in 2 weeks."  INTERIM HX: ***  Past Medical History:  Diagnosis Date   Allergic rhinitis    with upper airway cough: as of 07/2017 pulm f/u she was instructed to use astelin, flonase, and saline more consistently   BPPV (benign paroxysmal positional vertigo) 03/2018   C. difficile colitis    Chest pain, non-cardiac    Cardiac CT showed no signif obstructive dz, mildly elevated calcium level (Dr. [name]).   Chronic cystitis with hematuria    [name]   Chronic hypoxemic respiratory failure ([place]) 02/02/2015   2L oxygen 24/7   Chronic renal insufficiency, stage 2 (mild)    GFR approx 60   Cold hands    NCS/EMGs normal.  Hand dysesthesias per neuro--reassured   COPD (chronic obstructive pulmonary disease) ([place])    GOLD 4.  spirometry 01/10/09 FEV 0.97(52%), FEV1% 47.  With chronic bronchitis as of 07/2017.   DDD  (degenerative disc disease), cervical    1998 MRI C5-6 impingemt (left).  MRI 2021 (neurol) 1 level canal stenosis, 1 level foraminal stenosis--->PT   DDD (degenerative disc disease), lumbar    Depression with anxiety 04/15/2011   Diarrheal disease Summer 2017   ? 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).  As of 10/05/15 pt reports GI dx'd her with C diff and rx'd more flagyl and she is also on cholestyramine.  As of 02/2016, pt's sx's resolved completely.   Diverticulosis of colon    Procto '97 and colonoscopy 2002   Dysplastic nevus of upper extremity 04/2014   R tricep ([name])   Edema of both lower extremities due to peripheral venous insufficiency    Varicosities bilat, swelling L>R.  R baker's cyst.  Got vein clinic eval 02/2018->sclerotherapy recommended but since no hemorrhage or ulceration, insurance will not cover the procedure.   Family history of adverse reaction to anesthesia    mother died when patient age 39 receiving anesthesia for thyroid surgery   Fibrocystic breast disease    w/fibroadenoma.  Bx's showed NO atypia (Dr. Margot Chimes).  Mammo neg 2008.   GERD (gastroesophageal reflux disease)    History of double vision    Ophthalmologist, Dr. Herbert Deaner, is further evaluating this with MRI  orbits and limited brain (myesthenia gravis testing neg 07/2010)   History of home oxygen therapy    uses 2 liters at hs   Hypertension    EKG 03/2010 normal   Hypothyroidism    Hashimoto's, dx'd 1989   Idiopathic peripheral neuropathy 04/2018   Sensory (numb feet) S1 distribution bilat, c/w spinal stenosis sensory neuropathy (no pain). 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  Labs [place], MRI C spine->some spondylosis/stenosis.  UE NCS/EMS: normal 04/2019.   Lesion of right native kidney 2017   found on CT 02/2015.  F/u MRI 03/31/15  showed it to be smaller and less likely of any significance but repeat CT renal protocol in 60mo was recommended by radiology.  This was done 10/26/15 and showed no interval change in the lesion, but b/c the lesion enhances with contrast, radiol rec'd urol consult (b/c pap renal RCC could not be excluded).  Saw urol 03/06/16--stable on u/s 09/2016 and 09/2017.   OAB (overactive bladder)    Osteoarthritis, hip, bilateral    she is s/p bilat THA as of 02/2018.   Osteoporosis    radius, T score -2.5->rec'd alendronate 08/2019->plan rpt DEXA 08/2021.   Pneumonia    Postmenopausal atrophic vaginitis    improved with estrace 2 X/week.   Prolapse of vaginal cuff after hysterectomy    10/2018: Dr. Wayne Both. Mild colpocele 04/2019 per GYN (no surgery)   Pulmonary nodule    CT chest 12/10, June 2011.  No nodule on CT 06/2010.   Recurrent Clostridium difficile diarrhea    Episode 09/19/2016: resolved with prolonged course of flagyl.  11/2016 recurrence treated with 10d of Dificid (Fidaxomicin) by GI (Dr. Earlean Shawl).   Recurrent UTI    likely related to pt's atrophic vaginitis.  Urol started vaginal estrace 03/2016.   Rib fracture 09/2018   R 7th, laterally-->sustained when her dog pulled her over and she fell.   RLS (restless legs syndrome)    neuro->gabapentin trial 2020/21   Shingles 02/2014   R abd/flank   Spinal stenosis of lumbar region with neurogenic claudication    2008 MRI (L3-4, L4-5, L5-S1).  +S1 distribution sensory loss bilat    Past Surgical History:  Procedure Laterality Date   ABDOMINAL HYSTERECTOMY  1978   no BSO per pt--nonmalignant reasons   APPENDECTOMY     BREAST CYST EXCISION     Last screening mammogram 10/2010 was normal.   CHOLECYSTECTOMY  2001   COLONOSCOPY  04/2005; 12/06/2015   2007 NORMAL.  2017 adenomatous polyp x 1.  Mod sigmoid diverticulosis (Dr. Earlean Shawl).     DEXA  05/18/2017; 08/7019   2019 NORMAL (T-score 0.1).  08/2019->T score -2.5->alendronate started->plan Rpt 08/2021.   03/2022 T score -2.7   HIP CLOSED REDUCTION Right 12/25/2015   Procedure: CLOSED REDUCTION HIP;  Surgeon: Nicholes Stairs, MD;  Location: [facility];  Service: Orthopedics;  Laterality: Right;   NCV/EMS  10/2018   chronic and symmetric sensorimotor axonal polyneuropathy affecting the lower extremities   TONSILLECTOMY     TOTAL HIP ARTHROPLASTY Right 11/01/2014   Procedure: RIGHT TOTAL HIP ARTHROPLASTY;  Surgeon: Latanya Maudlin, MD;  Location: [facility];  Service: Orthopedics;  Laterality: Right;   TOTAL HIP ARTHROPLASTY Left 12/08/2017   Procedure: LEFT TOTAL HIP ARTHROPLASTY ANTERIOR APPROACH;  Surgeon: Paralee Cancel, MD;  Location: [facility];  Service: Orthopedics;  Laterality: Left;  70 mins   TRANSTHORACIC ECHOCARDIOGRAM  08/2014   2016 EF 60-65%, grade I diast dysfxn. 06/21/21  unchanged.   TUBAL LIGATION  1974    Outpatient Medications Prior to Visit  Medication Sig Dispense Refill   albuterol (PROAIR HFA) 108 (90 Base) MCG/ACT inhaler Inhale 2 puffs into the lungs every 4 (four) hours as needed for wheezing or shortness of breath. 8.5 g 1   albuterol (PROVENTIL) (2.5 MG/3ML) 0.083% nebulizer solution Take 3 mLs (2.5 mg total) by nebulization every 6 (six) hours as needed for wheezing or shortness of breath. 360 mL 5   alendronate (FOSAMAX) 70 MG tablet TAKE 1 TABLET(70 MG) BY MOUTH 1 TIME A WEEK WITH A FULL GLASS OF WATER AND ON AN EMPTY STOMACH 12 tablet 3   Artificial Tear Solution (SOOTHE XP) SOLN Place 1 drop into both eyes 2 (two) times daily as needed (irritation).     aspirin EC 81 MG tablet Take 81 mg by mouth daily.     azelastine (ASTELIN) 0.1 % nasal spray Place 2 sprays into both nostrils 2 (two) times daily. Use in each nostril as directed 30 mL 3   cetirizine (ZYRTEC) 10 MG tablet Take 1 tablet (10 mg total) by mouth daily. (Patient taking differently: Take 10 mg by mouth daily as needed for allergies.) 30 tablet 6   ciprofloxacin (CILOXAN) 0.3 % ophthalmic solution Place 1 drop  into the left eye every 4 (four) hours while awake. Administer 1 drop, every 2 hours, while awake, for 2 days. Then 1 drop, every 4 hours, while awake, for the next 5 days. 5 mL 0   clobetasol (TEMOVATE) 0.05 % external solution APPLY TO THE AFFECTED AREA EVERY DAY 50 mL 4   cycloSPORINE (RESTASIS OP) Place 1 drop into both eyes daily as needed (dry eyes).     diazepam (VALIUM) 5 MG tablet TAKE 1 TABLET BY MOUTH DAILY AS NEEDED FOR ANXIETY OR INSOMNIA (Patient taking differently: Take 5 mg by mouth daily as needed for anxiety or sedation. TAKE 1 TABLET BY MOUTH DAILY AS NEEDED FOR ANXIETY OR INSOMNIA) 30 tablet 3   fluticasone (CUTIVATE) 0.05 % cream Apply topically 2 (two) times daily. 30 g 1   fluticasone (FLONASE) 50 MCG/ACT nasal spray Place 2 sprays into both nostrils daily. 16 g 6   Fluticasone-Umeclidin-Vilant (TRELEGY ELLIPTA) 100-62.5-25 MCG/ACT AEPB Inhale 1 puff into the lungs daily. 180 each 3   furosemide (LASIX) 20 MG tablet 1 tab po twice a day as needed for LE swelling 60 tablet 1   levothyroxine (SYNTHROID) 100 MCG tablet 1/2 tab po on two days a week, 1 tab on all others 90 tablet 1   Lifitegrast (XIIDRA) 5 % SOLN Place 1 drop into both eyes in the morning and at bedtime.     Misc. Devices (PULSE OXIMETER) MISC Check oxygen level as needed 1 each 1   roflumilast (DALIRESP) 500 MCG TABS tablet Take 1 tablet (500 mcg total) by mouth daily. 90 tablet 3   spironolactone (ALDACTONE) 25 MG tablet TAKE 1 AND 1/2 TABLETS BY MOUTH TWICE DAILY 270 tablet 0   triamcinolone cream (KENALOG) 0.1 % Apply 1 application topically 2 (two) times daily. 45 g 2   venlafaxine (EFFEXOR) 50 MG tablet Take 1 tablet (50 mg total) by mouth 2 (two) times daily. 180 tablet 1   Vibegron (GEMTESA) 75 MG TABS Take 1 capsule by mouth daily. 30 tablet 11   vitamin B-12 (CYANOCOBALAMIN) 100 MCG tablet Take 100 mcg by mouth daily.     VITAMIN D PO Take 1 tablet by mouth daily.  No facility-administered  medications prior to visit.    Allergies  Allergen Reactions   Losartan Other (See Comments)    hyperkalemia   Penicillins Hives, Swelling and Rash    Has patient had a PCN reaction causing immediate rash, facial/tongue/throat swelling, SOB or lightheadedness with hypotension: YES Has patient had a PCN reaction causing severe rash involving mucus membranes or skin necrosis: NO Has patient had a PCN reaction that required hospitalization NO Has patient had a PCN reaction occurring within the last 10 years: NO If all of the above answers are "NO", then may proceed with Cephalosporin use.    Cefixime [Kdc:Cefixime] Other (See Comments)    unspecified   Gabapentin Other (See Comments)    Feels woozy   Indocin [Indomethacin] Other (See Comments)    Painful tongue and throat   Singulair [Montelukast Sodium]     Chest tightness, decreased mental clarity   Sulfa Antibiotics Other (See Comments)    unspecified   Ace Inhibitors Other (See Comments)    cough   Statins Other (See Comments)    Myalgias     Review of Systems As per HPI  PE:    04/16/2022    2:15 PM 04/16/2022    1:39 PM 04/11/2022   11:10 AM  Vitals with BMI  Height  5\' 0"    Weight  115 lbs 3 oz   BMI  [id number]   Systolic Q000111Q [id number]  Diastolic 70 71 74  Pulse  71      Physical Exam  ***  LABS:  Last CBC Lab Results  Component Value Date   WBC 7.7 03/12/2022   HGB 11.8 (L) 03/12/2022   HCT 35.2 (L) 03/12/2022   MCV 89.4 03/12/2022   MCH 29.9 02/23/2022   RDW 14.4 03/12/2022   PLT 240.0 [id number]   Last metabolic panel Lab Results  Component Value Date   GLUCOSE 131 (H) 02/23/2022   NA 138 02/23/2022   K 4.5 02/23/2022   CL 102 02/23/2022   CO2 26 02/23/2022   BUN 23 02/23/2022   CREATININE 0.84 02/23/2022   GFRNONAA >60 02/23/2022   CALCIUM 9.0 02/23/2022   PHOS 3.8 01/31/2013   PROT 6.1 (L) 10/29/2021   ALBUMIN 3.1 (L) 10/29/2021   BILITOT 0.4 10/29/2021   ALKPHOS 58 10/29/2021   AST 18  10/29/2021   ALT 42 10/29/2021   ANIONGAP 10 02/23/2022   Last thyroid functions Lab Results  Component Value Date   TSH 0.03 (L) 03/12/2022   T3TOTAL 97.2 07/11/2010   T4TOTAL 12.4 07/11/2010   IMPRESSION AND PLAN:  No problem-specific Assessment & Plan notes found for this encounter.  Tsh  An After Visit Summary was printed and given to the patient.  FOLLOW UP: No follow-ups on file.  Signed:  Crissie Sickles, MD           05/01/2022

## 2022-05-02 ENCOUNTER — Ambulatory Visit: Payer: Medicare Other | Admitting: Family Medicine

## 2022-05-07 ENCOUNTER — Ambulatory Visit (HOSPITAL_BASED_OUTPATIENT_CLINIC_OR_DEPARTMENT_OTHER)
Admission: RE | Admit: 2022-05-07 | Discharge: 2022-05-07 | Disposition: A | Discharge status: Home / Self Care | Payer: Medicare PPO | Source: Ambulatory Visit | Attending: Family Medicine | Admitting: Family Medicine

## 2022-05-07 ENCOUNTER — Ambulatory Visit (INDEPENDENT_AMBULATORY_CARE_PROVIDER_SITE_OTHER): Payer: Medicare PPO | Admitting: Family Medicine

## 2022-05-07 ENCOUNTER — Encounter: Payer: Self-pay | Admitting: Family Medicine

## 2022-05-07 VITALS — BP 110/64 | HR 94 | Wt 115.6 lb

## 2022-05-07 DIAGNOSIS — J449 Chronic obstructive pulmonary disease, unspecified: Secondary | ICD-10-CM | POA: Diagnosis not present

## 2022-05-07 DIAGNOSIS — K219 Gastro-esophageal reflux disease without esophagitis: Secondary | ICD-10-CM | POA: Diagnosis not present

## 2022-05-07 DIAGNOSIS — J441 Chronic obstructive pulmonary disease with (acute) exacerbation: Secondary | ICD-10-CM

## 2022-05-07 DIAGNOSIS — J9611 Chronic respiratory failure with hypoxia: Secondary | ICD-10-CM | POA: Diagnosis not present

## 2022-05-07 DIAGNOSIS — I10 Essential (primary) hypertension: Secondary | ICD-10-CM | POA: Diagnosis not present

## 2022-05-07 DIAGNOSIS — J439 Emphysema, unspecified: Secondary | ICD-10-CM | POA: Diagnosis not present

## 2022-05-07 DIAGNOSIS — E559 Vitamin D deficiency, unspecified: Secondary | ICD-10-CM | POA: Diagnosis not present

## 2022-05-07 DIAGNOSIS — R0602 Shortness of breath: Secondary | ICD-10-CM | POA: Insufficient documentation

## 2022-05-07 DIAGNOSIS — E039 Hypothyroidism, unspecified: Secondary | ICD-10-CM | POA: Diagnosis not present

## 2022-05-07 MED ORDER — PREDNISONE 20 MG PO TABS: ORAL_TABLET | ORAL | 0 refills | Status: DC

## 2022-05-07 MED ORDER — PANTOPRAZOLE SODIUM 40 MG PO TBEC: 40.0000 mg | DELAYED_RELEASE_TABLET | Freq: Two times a day (BID) | ORAL | 0 refills | Status: DC

## 2022-05-07 NOTE — Progress Notes (Signed)
OFFICE VISIT  05/07/2022  CC:  Chief Complaint  Patient presents with   Follow-up    Follow up. She has been having more trouble breathing and increased confusion.    Patient is a 87 y.o. female who presents for f/u copd with chronic hypoxic RF, vit D def, HTN, and hypothyroidism.  INTERIM HX: The last 3 days she has felt more short of breath, easily winded and requiring more oxygen when ambulating.  She often takes her oxygen off because it gets in the way of her trying to do things around her house and yard. Denies any recent change in her cough or mucus production and denies recent problem with wheezing. No chest pain or leg pain or swelling.  No fever. Denies nasal congestion, sinus pressure, or postnasal drip. She does note a more gravelly/hoarse voice and a little bit of voice weakness, feels like it has been since being on oxygen 24/7.  She does feel little bit of globus sensation, clears her throat a lot.  Denies heartburn.  Currently takes no antacid, H2 blocker, or PPI.  TSH was low on 03/12/2022.  I recommended that she decrease levothyroxine to 1/2 tab on two days a week and continue whole tab all other days.  MD level was low at that time as well and I recommended she start 2000 units vitamin D daily.  ROS as above, plus--> no dizziness, no HAs, no rashes, no melena/hematochezia.  No polyuria or polydipsia.  No myalgias or arthralgias.  No focal weakness, paresthesias, or tremors.  No acute vision or hearing abnormalities.  No dysuria or unusual/new urinary urgency or frequency.  No n/v/d or abd pain.  No palpitations.    Past Medical History:  Diagnosis Date   Allergic rhinitis    with upper airway cough: as of 07/2017 pulm f/u she was instructed to use astelin, flonase, and saline more consistently   BPPV (benign paroxysmal positional vertigo) 03/2018   C. difficile colitis    Chest pain, non-cardiac    Cardiac CT showed no signif obstructive dz, mildly elevated calcium  level (Dr. [name]).   Chronic cystitis with hematuria    [name]   Chronic hypoxemic respiratory failure 02/02/2015   2L oxygen 24/7   Chronic renal insufficiency, stage 2 (mild)    GFR approx 60   Cold hands    NCS/EMGs normal.  Hand dysesthesias per neuro--reassured   COPD (chronic obstructive pulmonary disease)    GOLD 4.  spirometry 01/10/09 FEV 0.97(52%), FEV1% 47.  With chronic bronchitis as of 07/2017.   DDD (degenerative disc disease), cervical    1998 MRI C5-6 impingemt (left).  MRI 2021 (neurol) 1 level canal stenosis, 1 level foraminal stenosis--->PT   DDD (degenerative disc disease), lumbar    Depression with anxiety 04/15/2011   Diarrheal disease Summer 2017   ? 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).  As of 10/05/15 pt reports GI dx'd her with C diff and rx'd more flagyl and she is also on cholestyramine.  As of 02/2016, pt's sx's resolved completely.   Diverticulosis of colon    Procto '97 and colonoscopy 2002   Dysplastic nevus of upper extremity 04/2014   R tricep ([name])   Edema of both lower extremities due to peripheral venous insufficiency    Varicosities bilat, swelling L>R.  R baker's cyst.  Got vein clinic eval 02/2018->sclerotherapy recommended but since no hemorrhage or  ulceration, insurance will not cover the procedure.   Family history of adverse reaction to anesthesia    mother died when patient age 89 receiving anesthesia for thyroid surgery   Fibrocystic breast disease    w/fibroadenoma.  Bx's showed NO atypia (Dr. Margot Chimes).  Mammo neg 2008.   GERD (gastroesophageal reflux disease)    History of double vision    Ophthalmologist, Dr. Herbert Deaner, is further evaluating this with MRI  orbits and limited brain (myesthenia gravis testing neg 07/2010)   History of home oxygen therapy    uses 2 liters at hs   Hypertension    EKG 03/2010 normal    Hypothyroidism    Hashimoto's, dx'd 1989   Idiopathic peripheral neuropathy 04/2018   Sensory (numb feet) S1 distribution bilat, c/w spinal stenosis sensory neuropathy (no pain). 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  Labs [place], MRI C spine->some spondylosis/stenosis.  UE NCS/EMS: normal 04/2019.   Lesion of right native kidney 2017   found on CT 02/2015.  F/u MRI 03/31/15 showed it to be smaller and less likely of any significance but repeat CT renal protocol in 78mo was recommended by radiology.  This was done 10/26/15 and showed no interval change in the lesion, but b/c the lesion enhances with contrast, radiol rec'd urol consult (b/c pap renal RCC could not be excluded).  Saw urol 03/06/16--stable on u/s 09/2016 and 09/2017.   OAB (overactive bladder)    Osteoarthritis, hip, bilateral    she is s/p bilat THA as of 02/2018.   Osteoporosis    radius, T score -2.5->rec'd alendronate 08/2019->plan rpt DEXA 08/2021.   Pneumonia    Postmenopausal atrophic vaginitis    improved with estrace 2 X/week.   Prolapse of vaginal cuff after hysterectomy    10/2018: Dr. Wayne Both. Mild colpocele 04/2019 per GYN (no surgery)   Pulmonary nodule    CT chest 12/10, June 2011.  No nodule on CT 06/2010.   Recurrent Clostridium difficile diarrhea    Episode 09/19/2016: resolved with prolonged course of flagyl.  11/2016 recurrence treated with 10d of Dificid (Fidaxomicin) by GI (Dr. Earlean Shawl).   Recurrent UTI    likely related to pt's atrophic vaginitis.  Urol started vaginal estrace 03/2016.   Rib fracture 09/2018   R 7th, laterally-->sustained when her dog pulled her over and she fell.   RLS (restless legs syndrome)    neuro->gabapentin trial 2020/21   Shingles 02/2014   R abd/flank   Spinal stenosis of lumbar region with neurogenic claudication    2008 MRI (L3-4, L4-5, L5-S1).  +S1 distribution sensory loss bilat    Past Surgical History:  Procedure  Laterality Date   ABDOMINAL HYSTERECTOMY  1978   no BSO per pt--nonmalignant reasons   APPENDECTOMY     BREAST CYST EXCISION     Last screening mammogram 10/2010 was normal.   CHOLECYSTECTOMY  2001   COLONOSCOPY  04/2005; 12/06/2015   2007 NORMAL.  2017 adenomatous polyp x 1.  Mod sigmoid diverticulosis (Dr. Earlean Shawl).     DEXA  05/18/2017; 08/7019   2019 NORMAL (T-score 0.1).  08/2019->T score -2.5->alendronate started->plan Rpt 08/2021.  03/2022 T score -2.7   HIP CLOSED REDUCTION Right 12/25/2015   Procedure: CLOSED REDUCTION HIP;  Surgeon: Nicholes Stairs, MD;  Location: [facility];  Service: Orthopedics;  Laterality: Right;   NCV/EMS  10/2018   chronic and symmetric sensorimotor axonal polyneuropathy affecting the lower extremities   TONSILLECTOMY     TOTAL  HIP ARTHROPLASTY Right 11/01/2014   Procedure: RIGHT TOTAL HIP ARTHROPLASTY;  Surgeon: Latanya Maudlin, MD;  Location: [facility];  Service: Orthopedics;  Laterality: Right;   TOTAL HIP ARTHROPLASTY Left 12/08/2017   Procedure: LEFT TOTAL HIP ARTHROPLASTY ANTERIOR APPROACH;  Surgeon: Paralee Cancel, MD;  Location: [facility];  Service: Orthopedics;  Laterality: Left;  70 mins   TRANSTHORACIC ECHOCARDIOGRAM  08/2014   2016 EF 60-65%, grade I diast dysfxn. 06/21/21 unchanged.   TUBAL LIGATION  1974    Outpatient Medications Prior to Visit  Medication Sig Dispense Refill   albuterol (PROAIR HFA) 108 (90 Base) MCG/ACT inhaler Inhale 2 puffs into the lungs every 4 (four) hours as needed for wheezing or shortness of breath. 8.5 g 1   albuterol (PROVENTIL) (2.5 MG/3ML) 0.083% nebulizer solution Take 3 mLs (2.5 mg total) by nebulization every 6 (six) hours as needed for wheezing or shortness of breath. 360 mL 5   alendronate (FOSAMAX) 70 MG tablet TAKE 1 TABLET(70 MG) BY MOUTH 1 TIME A WEEK WITH A FULL GLASS OF WATER AND ON AN EMPTY STOMACH 12 tablet 3   Artificial Tear Solution (SOOTHE XP) SOLN Place 1 drop into both eyes 2 (two) times daily as needed  (irritation).     aspirin EC 81 MG tablet Take 81 mg by mouth daily.     azelastine (ASTELIN) 0.1 % nasal spray Place 2 sprays into both nostrils 2 (two) times daily. Use in each nostril as directed 30 mL 3   cetirizine (ZYRTEC) 10 MG tablet Take 1 tablet (10 mg total) by mouth daily. (Patient taking differently: Take 10 mg by mouth daily as needed for allergies.) 30 tablet 6   ciprofloxacin (CILOXAN) 0.3 % ophthalmic solution Place 1 drop into the left eye every 4 (four) hours while awake. Administer 1 drop, every 2 hours, while awake, for 2 days. Then 1 drop, every 4 hours, while awake, for the next 5 days. 5 mL 0   clobetasol (TEMOVATE) 0.05 % external solution APPLY TO THE AFFECTED AREA EVERY DAY 50 mL 4   cycloSPORINE (RESTASIS OP) Place 1 drop into both eyes daily as needed (dry eyes).     diazepam (VALIUM) 5 MG tablet TAKE 1 TABLET BY MOUTH DAILY AS NEEDED FOR ANXIETY OR INSOMNIA (Patient taking differently: Take 5 mg by mouth daily as needed for anxiety or sedation. TAKE 1 TABLET BY MOUTH DAILY AS NEEDED FOR ANXIETY OR INSOMNIA) 30 tablet 3   fluticasone (CUTIVATE) 0.05 % cream Apply topically 2 (two) times daily. 30 g 1   fluticasone (FLONASE) 50 MCG/ACT nasal spray Place 2 sprays into both nostrils daily. 16 g 6   Fluticasone-Umeclidin-Vilant (TRELEGY ELLIPTA) 100-62.5-25 MCG/ACT AEPB Inhale 1 puff into the lungs daily. 180 each 3   furosemide (LASIX) 20 MG tablet 1 tab po twice a day as needed for LE swelling 60 tablet 1   levothyroxine (SYNTHROID) 100 MCG tablet 1/2 tab po on two days a week, 1 tab on all others 90 tablet 1   Lifitegrast (XIIDRA) 5 % SOLN Place 1 drop into both eyes in the morning and at bedtime.     Misc. Devices (PULSE OXIMETER) MISC Check oxygen level as needed 1 each 1   roflumilast (DALIRESP) 500 MCG TABS tablet Take 1 tablet (500 mcg total) by mouth daily. 90 tablet 3   spironolactone (ALDACTONE) 25 MG tablet TAKE 1 AND 1/2 TABLETS BY MOUTH TWICE DAILY 270 tablet 0    triamcinolone cream (KENALOG) 0.1 %  Apply 1 application topically 2 (two) times daily. 45 g 2   venlafaxine (EFFEXOR) 50 MG tablet Take 1 tablet (50 mg total) by mouth 2 (two) times daily. 180 tablet 1   Vibegron (GEMTESA) 75 MG TABS Take 1 capsule by mouth daily. 30 tablet 11   vitamin B-12 (CYANOCOBALAMIN) 100 MCG tablet Take 100 mcg by mouth daily.     VITAMIN D PO Take 1 tablet by mouth daily.     No facility-administered medications prior to visit.    Allergies  Allergen Reactions   Losartan Other (See Comments)    hyperkalemia   Penicillins Hives, Swelling and Rash    Has patient had a PCN reaction causing immediate rash, facial/tongue/throat swelling, SOB or lightheadedness with hypotension: YES Has patient had a PCN reaction causing severe rash involving mucus membranes or skin necrosis: NO Has patient had a PCN reaction that required hospitalization NO Has patient had a PCN reaction occurring within the last 10 years: NO If all of the above answers are "NO", then may proceed with Cephalosporin use.    Cefixime [Kdc:Cefixime] Other (See Comments)    unspecified   Gabapentin Other (See Comments)    Feels woozy   Indocin [Indomethacin] Other (See Comments)    Painful tongue and throat   Singulair [Montelukast Sodium]     Chest tightness, decreased mental clarity   Sulfa Antibiotics Other (See Comments)    unspecified   Ace Inhibitors Other (See Comments)    cough   Statins Other (See Comments)    Myalgias     Review of Systems As per HPI  PE:    05/07/2022    1:48 PM 04/16/2022    2:15 PM 04/16/2022    1:39 PM  Vitals with BMI  Height   5\' 0"   Weight 115 lbs 10 oz  115 lbs 3 oz  BMI [id number]  [id number]  Systolic [id number]  Diastolic 64 70 71  Pulse 94  71   02 sat 92% on 3 L [place] today  Physical Exam  Gen: Alert, well appearing.  Patient is oriented to person, place, time, and situation. AFFECT: pleasant, lucid thought and speech. [id number]: no injection,  icteris, swelling, or exudate.  EOMI, PERRLA. Mouth: lips without lesion/swelling.  Oral mucosa pink and moist. Oropharynx without erythema, exudate, or swelling.  NECK: no adenopathy CV: RRR, no m/r/g.   LUNGS: Soft/subtle bibasilar crackles, otherwise CTA bilat, respiratory rate is increased but work of breathing is nonlabored.  Minimal prolongation of expiratory phase.  Aeration symmetric.  LABS:  Last CBC Lab Results  Component Value Date   WBC 7.7 03/12/2022   HGB 11.8 (L) 03/12/2022   HCT 35.2 (L) 03/12/2022   MCV 89.4 03/12/2022   MCH 29.9 02/23/2022   RDW 14.4 03/12/2022   PLT 240.0 [id number]   Last metabolic panel Lab Results  Component Value Date   GLUCOSE 131 (H) 02/23/2022   NA 138 02/23/2022   K 4.5 02/23/2022   CL 102 02/23/2022   CO2 26 02/23/2022   BUN 23 02/23/2022   CREATININE 0.84 02/23/2022   GFRNONAA >60 02/23/2022   CALCIUM 9.0 02/23/2022   PHOS 3.8 01/31/2013   PROT 6.1 (L) 10/29/2021   ALBUMIN 3.1 (L) 10/29/2021   BILITOT 0.4 10/29/2021   ALKPHOS 58 10/29/2021   AST 18 10/29/2021   ALT 42 10/29/2021   ANIONGAP 10 02/23/2022   Last lipids Lab Results  Component Value Date   CHOL 198 12/07/2020  HDL 66 12/07/2020   LDLCALC 113 (H) 12/07/2020   LDLDIRECT 131.3 03/07/2010   TRIG 88 12/07/2020   CHOLHDL 3.0 12/07/2020   Last hemoglobin A1c Lab Results  Component Value Date   HGBA1C 5.7 10/29/2011   Last thyroid functions Lab Results  Component Value Date   TSH 0.03 (L) 03/12/2022   T3TOTAL 97.2 07/11/2010   T4TOTAL 12.4 07/11/2010   Last vitamin D Lab Results  Component Value Date   VD25OH 27.36 (L) 03/12/2022   Last vitamin B12 and Folate Lab Results  Component Value Date   VITAMINB12 314 10/12/2018   FOLATE 15.8 10/12/2018   IMPRESSION AND PLAN:  #1 COPD exacerbation in the setting of chronic hypoxic respiratory failure. Prednisone 40 mg a day x 5 days then 20 mg a day x 5 days.  Continue home albuterol nebulizer every  6 hours as needed.  Cont trelegy ellipta, and daliresp. Check chest x-ray today. Home oxygen 24/7 to keep sats greater than or equal to 92%.  Encouraged compliance with wearing the oxygen.  #2 hypertension, well-controlled on spironolactone 25 mg, 1-1/2 tabs twice daily. Electrolytes and creatinine today.  #3 LPR. Start pantoprazole 40 mg twice daily and if significant improvement in the next few weeks will decrease to daily.  4.  Hypothyroidism.  Relatively recent dose adjustment, see HPI above. TSH checked today.  5.  Vitamin D deficiency. She has been on vitamin D 2000 units daily.  Recheck level today.  An After Visit Summary was printed and given to the patient.  FOLLOW UP: Return for 5-7 day f/u copd exac.  Signed:  Crissie Sickles, MD           05/07/2022

## 2022-05-08 LAB — BASIC METABOLIC PANEL
BUN: 18 mg/dL (ref 6–23)
CO2: 29 mEq/L (ref 19–32)
Calcium: 9.7 mg/dL (ref 8.4–10.5)
Chloride: 107 mEq/L (ref 96–112)
Creatinine, Ser: 0.82 mg/dL (ref 0.40–1.20)
GFR: 65.41 mL/min (ref >60.00)
Glucose, Bld: 126 mg/dL — ABNORMAL HIGH (ref 70–99)
Potassium: 3.9 mEq/L (ref 3.5–5.1)
Sodium: 146 mEq/L — ABNORMAL HIGH (ref 135–145)

## 2022-05-08 LAB — TSH: TSH: 0.01 u[IU]/mL — ABNORMAL LOW (ref 0.35–5.50)

## 2022-05-08 LAB — VITAMIN D 25 HYDROXY (VIT D DEFICIENCY, FRACTURES): VITD: 28.09 ng/mL — ABNORMAL LOW (ref 30.00–100.00)

## 2022-05-09 ENCOUNTER — Telehealth: Payer: Self-pay

## 2022-05-09 ENCOUNTER — Other Ambulatory Visit: Payer: Self-pay

## 2022-05-09 MED ORDER — LEVOTHYROXINE SODIUM 100 MCG PO TABS: ORAL_TABLET | ORAL | 1 refills | Status: DC

## 2022-05-09 NOTE — Telephone Encounter (Signed)
Labs and instructions given to patient

## 2022-05-09 NOTE — Telephone Encounter (Signed)
Patient returning call regarding lab results.  Please call patient on her friend's phone, her phone is not working right now.  Jaynell can be reached at [phone].

## 2022-05-13 DIAGNOSIS — J449 Chronic obstructive pulmonary disease, unspecified: Secondary | ICD-10-CM | POA: Diagnosis not present

## 2022-05-13 DIAGNOSIS — J984 Other disorders of lung: Secondary | ICD-10-CM | POA: Diagnosis not present

## 2022-05-13 DIAGNOSIS — Z96641 Presence of right artificial hip joint: Secondary | ICD-10-CM | POA: Diagnosis not present

## 2022-05-13 DIAGNOSIS — R0902 Hypoxemia: Secondary | ICD-10-CM | POA: Diagnosis not present

## 2022-05-16 ENCOUNTER — Telehealth: Payer: PRIVATE HEALTH INSURANCE | Admitting: Family Medicine

## 2022-05-16 ENCOUNTER — Encounter: Payer: Self-pay | Admitting: Family Medicine

## 2022-05-16 VITALS — BP 118/60 | Wt 115.0 lb

## 2022-05-16 DIAGNOSIS — J9611 Chronic respiratory failure with hypoxia: Secondary | ICD-10-CM | POA: Diagnosis not present

## 2022-05-16 DIAGNOSIS — R490 Dysphonia: Secondary | ICD-10-CM | POA: Diagnosis not present

## 2022-05-16 DIAGNOSIS — J441 Chronic obstructive pulmonary disease with (acute) exacerbation: Secondary | ICD-10-CM | POA: Diagnosis not present

## 2022-05-16 MED ORDER — PREDNISONE 10 MG PO TABS
ORAL_TABLET | ORAL | 0 refills | Status: DC
Start: 2022-05-16 — End: 2022-06-03

## 2022-05-16 NOTE — Progress Notes (Signed)
Virtual Visit via Video Note  I connected with Priscilla Cantrell and her significant other, James  on 05/16/22 at  1:20 PM EDT by a video enabled telemedicine application and verified that I am speaking with the correct person using two identifiers.  Location patient: [place] Location provider:work or home office Persons participating in the virtual visit: patient, provider  I discussed the limitations and requested verbal permission for telemedicine visit. The patient expressed understanding and agreed to proceed.  CC:  Patient is a 87 y.o. female who presents for 9-day follow-up COPD exacerbation A/P as of last visit: "#1 COPD exacerbation in the setting of chronic hypoxic respiratory failure. Prednisone 40 mg a day x 5 days then 20 mg a day x 5 days.  Continue home albuterol nebulizer every 6 hours as needed.  Cont trelegy ellipta, and daliresp. Check chest x-ray today. Home oxygen 24/7 to keep sats greater than or equal to 92%.  Encouraged compliance with wearing the oxygen.   #2 hypertension, well-controlled on spironolactone 25 mg, 1-1/2 tabs twice daily. Electrolytes and creatinine today.   #3 LPR. Start pantoprazole 40 mg twice daily and if significant improvement in the next few weeks will decrease to daily.   4.  Hypothyroidism.  Relatively recent dose adjustment, see HPI above. TSH checked today.   5.  Vitamin D deficiency. She has been on vitamin D 2000 units daily.  Recheck level today."  INTERIM HX: Feeling better but not quite back to baseline. Oxygenating greater than 92% on room air at rest but requiring a couple liters with ambulation. No fever. Still complains of voice being "croaky", sometimes voice is weak.  Chest x-ray last visit showed cardiomegaly without acute abnormality of the lungs.  Pulmonary hyperinflation and emphysema were present.  ROS: See pertinent positives and negatives per HPI.  Past Medical History:  Diagnosis Date   Allergic rhinitis    with upper  airway cough: as of 07/2017 pulm f/u she was instructed to use astelin, flonase, and saline more consistently   BPPV (benign paroxysmal positional vertigo) 03/2018   C. difficile colitis    Chest pain, non-cardiac    Cardiac CT showed no signif obstructive dz, mildly elevated calcium level (Dr. Jens Som).   Chronic cystitis with hematuria    Dr. Lenon Curt   Chronic hypoxemic respiratory failure 02/02/2015   2L oxygen 24/7   Chronic renal insufficiency, stage 2 (mild)    GFR approx 60   Cold hands    NCS/EMGs normal.  Hand dysesthesias per neuro--reassured   COPD (chronic obstructive pulmonary disease)    GOLD 4.  spirometry 01/10/09 FEV 0.97(52%), FEV1% 47.  With chronic bronchitis as of 07/2017.   DDD (degenerative disc disease), cervical    1998 MRI C5-6 impingemt (left).  MRI 2021 (neurol) 1 level canal stenosis, 1 level foraminal stenosis--->PT   DDD (degenerative disc disease), lumbar    Depression with anxiety 04/15/2011   Diarrheal disease Summer 2017   ? 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 Dr Kinnie Scales).  As of 10/05/15 pt reports GI dx'd her with C diff and rx'd more flagyl and she is also on cholestyramine.  As of 02/2016, pt's sx's resolved completely.   Diverticulosis of colon    Procto '97 and colonoscopy 2002   Dysplastic nevus of upper extremity 04/2014   R tricep (Dr. Jorja Loa)   Edema of both lower extremities due to peripheral venous insufficiency    Varicosities  bilat, swelling L>R.  R baker's cyst.  Got vein clinic eval 02/2018->sclerotherapy recommended but since no hemorrhage or ulceration, insurance will not cover the procedure.   Family history of adverse reaction to anesthesia    mother died when patient age 37 receiving anesthesia for thyroid surgery   Fibrocystic breast disease    w/fibroadenoma.  Bx's showed NO atypia (Dr. Jamey Ripa).  Mammo neg 2008.   GERD  (gastroesophageal reflux disease)    History of double vision    Ophthalmologist, Dr. Elmer Picker, is further evaluating this with MRI  orbits and limited brain (myesthenia gravis testing neg 07/2010)   History of home oxygen therapy    uses 2 liters at hs   Hypertension    EKG 03/2010 normal   Hypothyroidism    Hashimoto's, dx'd 1989   Idiopathic peripheral neuropathy 04/2018   Sensory (numb feet) S1 distribution bilat, c/w spinal stenosis sensory neuropathy (no pain). 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  Labs [place], MRI C spine->some spondylosis/stenosis.  UE NCS/EMS: normal 04/2019.   Lesion of right native kidney 2017   found on CT 02/2015.  F/u MRI 03/31/15 showed it to be smaller and less likely of any significance but repeat CT renal protocol in 9mo was recommended by radiology.  This was done 10/26/15 and showed no interval change in the lesion, but b/c the lesion enhances with contrast, radiol rec'd urol consult (b/c pap renal RCC could not be excluded).  Saw urol 03/06/16--stable on u/s 09/2016 and 09/2017.   OAB (overactive bladder)    Osteoarthritis, hip, bilateral    she is s/p bilat THA as of 02/2018.   Osteoporosis    radius, T score -2.5->rec'd alendronate 08/2019->plan rpt DEXA 08/2021.   Pneumonia    Postmenopausal atrophic vaginitis    improved with estrace 2 X/week.   Prolapse of vaginal cuff after hysterectomy    10/2018: Dr. Katherina Right. Mild colpocele 04/2019 per GYN (no surgery)   Pulmonary nodule    CT chest 12/10, June 2011.  No nodule on CT 06/2010.   Recurrent Clostridium difficile diarrhea    Episode 09/19/2016: resolved with prolonged course of flagyl.  11/2016 recurrence treated with 10d of Dificid (Fidaxomicin) by GI (Dr. Kinnie Scales).   Recurrent UTI    likely related to pt's atrophic vaginitis.  Urol started vaginal estrace 03/2016.   Rib fracture 09/2018   R 7th, laterally-->sustained when her dog pulled her over  and she fell.   RLS (restless legs syndrome)    neuro->gabapentin trial 2020/21   Shingles 02/2014   R abd/flank   Spinal stenosis of lumbar region with neurogenic claudication    2008 MRI (L3-4, L4-5, L5-S1).  +S1 distribution sensory loss bilat    Past Surgical History:  Procedure Laterality Date   ABDOMINAL HYSTERECTOMY  1978   no BSO per pt--nonmalignant reasons   APPENDECTOMY     BREAST CYST EXCISION     Last screening mammogram 10/2010 was normal.   CHOLECYSTECTOMY  2001   COLONOSCOPY  04/2005; 12/06/2015   2007 NORMAL.  2017 adenomatous polyp x 1.  Mod sigmoid diverticulosis (Dr. Kinnie Scales).     DEXA  05/18/2017; 08/7019   2019 NORMAL (T-score 0.1).  08/2019->T score -2.5->alendronate started->plan Rpt 08/2021.  03/2022 T score -2.7   HIP CLOSED REDUCTION Right 12/25/2015   Procedure: CLOSED REDUCTION HIP;  Surgeon: Yolonda Kida, MD;  Location: [facility];  Service: Orthopedics;  Laterality: Right;   NCV/EMS  10/2018  chronic and symmetric sensorimotor axonal polyneuropathy affecting the lower extremities   TONSILLECTOMY     TOTAL HIP ARTHROPLASTY Right 11/01/2014   Procedure: RIGHT TOTAL HIP ARTHROPLASTY;  Surgeon: Ranee Gosselin, MD;  Location: [facility];  Service: Orthopedics;  Laterality: Right;   TOTAL HIP ARTHROPLASTY Left 12/08/2017   Procedure: LEFT TOTAL HIP ARTHROPLASTY ANTERIOR APPROACH;  Surgeon: Durene Romans, MD;  Location: [facility];  Service: Orthopedics;  Laterality: Left;  70 mins   TRANSTHORACIC ECHOCARDIOGRAM  08/2014   2016 EF 60-65%, grade I diast dysfxn. 06/21/21 unchanged.   TUBAL LIGATION  1974     Current Outpatient Medications:    albuterol (PROAIR HFA) 108 (90 Base) MCG/ACT inhaler, Inhale 2 puffs into the lungs every 4 (four) hours as needed for wheezing or shortness of breath., Disp: 8.5 g, Rfl: 1   albuterol (PROVENTIL) (2.5 MG/3ML) 0.083% nebulizer solution, Take 3 mLs (2.5 mg total) by nebulization every 6 (six) hours as needed for wheezing or  shortness of breath., Disp: 360 mL, Rfl: 5   alendronate (FOSAMAX) 70 MG tablet, TAKE 1 TABLET(70 MG) BY MOUTH 1 TIME A WEEK WITH A FULL GLASS OF WATER AND ON AN EMPTY STOMACH, Disp: 12 tablet, Rfl: 3   Artificial Tear Solution (SOOTHE XP) SOLN, Place 1 drop into both eyes 2 (two) times daily as needed (irritation)., Disp: , Rfl:    aspirin EC 81 MG tablet, Take 81 mg by mouth daily., Disp: , Rfl:    azelastine (ASTELIN) 0.1 % nasal spray, Place 2 sprays into both nostrils 2 (two) times daily. Use in each nostril as directed, Disp: 30 mL, Rfl: 3   cetirizine (ZYRTEC) 10 MG tablet, Take 1 tablet (10 mg total) by mouth daily. (Patient taking differently: Take 10 mg by mouth daily as needed for allergies.), Disp: 30 tablet, Rfl: 6   ciprofloxacin (CILOXAN) 0.3 % ophthalmic solution, Place 1 drop into the left eye every 4 (four) hours while awake. Administer 1 drop, every 2 hours, while awake, for 2 days. Then 1 drop, every 4 hours, while awake, for the next 5 days., Disp: 5 mL, Rfl: 0   clobetasol (TEMOVATE) 0.05 % external solution, APPLY TO THE AFFECTED AREA EVERY DAY, Disp: 50 mL, Rfl: 4   cycloSPORINE (RESTASIS OP), Place 1 drop into both eyes daily as needed (dry eyes)., Disp: , Rfl:    diazepam (VALIUM) 5 MG tablet, TAKE 1 TABLET BY MOUTH DAILY AS NEEDED FOR ANXIETY OR INSOMNIA (Patient taking differently: Take 5 mg by mouth daily as needed for anxiety or sedation. TAKE 1 TABLET BY MOUTH DAILY AS NEEDED FOR ANXIETY OR INSOMNIA), Disp: 30 tablet, Rfl: 3   fluticasone (CUTIVATE) 0.05 % cream, Apply topically 2 (two) times daily., Disp: 30 g, Rfl: 1   fluticasone (FLONASE) 50 MCG/ACT nasal spray, Place 2 sprays into both nostrils daily., Disp: 16 g, Rfl: 6   Fluticasone-Umeclidin-Vilant (TRELEGY ELLIPTA) 100-62.5-25 MCG/ACT AEPB, Inhale 1 puff into the lungs daily., Disp: 180 each, Rfl: 3   furosemide (LASIX) 20 MG tablet, 1 tab po twice a day as needed for LE swelling, Disp: 60 tablet, Rfl: 1    levothyroxine (SYNTHROID) 100 MCG tablet, 1/2 tab on 4 days a week and continue whole tab all other day, Disp: 90 tablet, Rfl: 1   Lifitegrast (XIIDRA) 5 % SOLN, Place 1 drop into both eyes in the morning and at bedtime., Disp: , Rfl:    Misc. Devices (PULSE OXIMETER) MISC, Check oxygen level as needed, Disp:  1 each, Rfl: 1   pantoprazole (PROTONIX) 40 MG tablet, Take 1 tablet (40 mg total) by mouth 2 (two) times daily., Disp: 60 tablet, Rfl: 0   predniSONE (DELTASONE) 20 MG tablet, 2 tabs po qd x 5d then 1 tab po qd x 5d, Disp: 15 tablet, Rfl: 0   roflumilast (DALIRESP) 500 MCG TABS tablet, Take 1 tablet (500 mcg total) by mouth daily., Disp: 90 tablet, Rfl: 3   spironolactone (ALDACTONE) 25 MG tablet, TAKE 1 AND 1/2 TABLETS BY MOUTH TWICE DAILY, Disp: 270 tablet, Rfl: 0   triamcinolone cream (KENALOG) 0.1 %, Apply 1 application topically 2 (two) times daily., Disp: 45 g, Rfl: 2   venlafaxine (EFFEXOR) 50 MG tablet, Take 1 tablet (50 mg total) by mouth 2 (two) times daily., Disp: 180 tablet, Rfl: 1   Vibegron (GEMTESA) 75 MG TABS, Take 1 capsule by mouth daily., Disp: 30 tablet, Rfl: 11   vitamin B-12 (CYANOCOBALAMIN) 100 MCG tablet, Take 100 mcg by mouth daily., Disp: , Rfl:    VITAMIN D PO, Take 1 tablet by mouth daily., Disp: , Rfl:   EXAM:  VITALS per patient if applicable:      05/16/2022    1:17 PM 05/07/2022    1:48 PM 04/16/2022    2:15 PM  Vitals with BMI  Weight 115 lbs 115 lbs 10 oz   BMI 22.46 22.58   Systolic 118 110 161  Diastolic 60 64 70  Pulse  94      GENERAL: alert, oriented, appears well and in no acute distress  HEENT: atraumatic, conjunttiva clear, no obvious abnormalities on inspection of external nose and ears  NECK: normal movements of the head and neck  LUNGS: on inspection no signs of respiratory distress, breathing rate appears normal, no obvious gross SOB, gasping or wheezing  CV: no obvious cyanosis  MS: moves all visible extremities without  noticeable abnormality  PSYCH/NEURO: pleasant and cooperative, no obvious depression or anxiety, speech and thought processing grossly intact  LABS: none today    Chemistry      Component Value Date/Time   NA 146 (H) 05/07/2022 1419   K 3.9 05/07/2022 1419   CL 107 05/07/2022 1419   CO2 29 05/07/2022 1419   BUN 18 05/07/2022 1419   CREATININE 0.82 05/07/2022 1419   CREATININE 0.95 06/28/2021 1553      Component Value Date/Time   CALCIUM 9.7 05/07/2022 1419   ALKPHOS 58 10/29/2021 1753   AST 18 10/29/2021 1753   ALT 42 10/29/2021 1753   BILITOT 0.4 10/29/2021 1753       ASSESSMENT AND PLAN:  Discussed the following assessment and plan:  1) COPD exacerbation, chronic hypoxic respiratory failure. Improved but not back to baseline.  Will extend steroids a bit: Prednisone 20 mg a day x 5 days and then 10 mg a day x 5 days. She will continue [place] daily and Daliresp daily.  Albuterol every 6 hours as needed. Cont oxygen prn ambulation but ok to go w/out when at rest if sat >92% on RA.   2) Voice hoarseness/weakness: We started PPI recently as empiric treatment for possible LPR.  Will give these voice symptoms another month or so on this medicine.  If not improved at that time we may consider getting her to ENT for consideration of direct visualization of her larynx.  I discussed the assessment and treatment plan with the patient. The patient was provided an opportunity to ask questions and all were answered.  The patient agreed with the plan and demonstrated an understanding of the instructions.   F/u: 10 days  Signed:  Santiago Bumpers, MD           05/16/2022

## 2022-05-17 ENCOUNTER — Emergency Department (HOSPITAL_BASED_OUTPATIENT_CLINIC_OR_DEPARTMENT_OTHER): Payer: Medicare PPO

## 2022-05-17 ENCOUNTER — Encounter (HOSPITAL_BASED_OUTPATIENT_CLINIC_OR_DEPARTMENT_OTHER): Payer: Self-pay

## 2022-05-17 ENCOUNTER — Emergency Department (HOSPITAL_BASED_OUTPATIENT_CLINIC_OR_DEPARTMENT_OTHER): Payer: Medicare PPO | Admitting: Radiology

## 2022-05-17 ENCOUNTER — Emergency Department (HOSPITAL_BASED_OUTPATIENT_CLINIC_OR_DEPARTMENT_OTHER)
Admission: EM | Admit: 2022-05-17 | Discharge: 2022-05-17 | Disposition: A | Discharge status: Home / Self Care | Payer: Medicare PPO | Attending: Emergency Medicine | Admitting: Emergency Medicine

## 2022-05-17 ENCOUNTER — Other Ambulatory Visit: Payer: Self-pay

## 2022-05-17 DIAGNOSIS — Y792 Prosthetic and other implants, materials and accessory orthopedic devices associated with adverse incidents: Secondary | ICD-10-CM | POA: Diagnosis not present

## 2022-05-17 DIAGNOSIS — T84020A Dislocation of internal right hip prosthesis, initial encounter: Secondary | ICD-10-CM | POA: Insufficient documentation

## 2022-05-17 DIAGNOSIS — Z7951 Long term (current) use of inhaled steroids: Secondary | ICD-10-CM | POA: Diagnosis not present

## 2022-05-17 DIAGNOSIS — Z7982 Long term (current) use of aspirin: Secondary | ICD-10-CM | POA: Diagnosis not present

## 2022-05-17 DIAGNOSIS — S73004A Unspecified dislocation of right hip, initial encounter: Secondary | ICD-10-CM | POA: Diagnosis not present

## 2022-05-17 DIAGNOSIS — M25551 Pain in right hip: Secondary | ICD-10-CM | POA: Diagnosis present

## 2022-05-17 DIAGNOSIS — J449 Chronic obstructive pulmonary disease, unspecified: Secondary | ICD-10-CM | POA: Insufficient documentation

## 2022-05-17 DIAGNOSIS — E039 Hypothyroidism, unspecified: Secondary | ICD-10-CM | POA: Insufficient documentation

## 2022-05-17 DIAGNOSIS — Z9889 Other specified postprocedural states: Secondary | ICD-10-CM

## 2022-05-17 DIAGNOSIS — I10 Essential (primary) hypertension: Secondary | ICD-10-CM | POA: Diagnosis not present

## 2022-05-17 LAB — CBC WITH DIFFERENTIAL/PLATELET
Abs Immature Granulocytes: 0.06 10*3/uL (ref 0.00–0.07)
Basophils Absolute: 0 10*3/uL (ref 0.0–0.1)
Basophils Relative: 0 %
Eosinophils Absolute: 0.1 10*3/uL (ref 0.0–0.5)
Eosinophils Relative: 1 %
HCT: 37.7 % (ref 36.0–46.0)
Hemoglobin: 11.8 g/dL — ABNORMAL LOW (ref 12.0–15.0)
Immature Granulocytes: 1 %
Lymphocytes Relative: 16 %
Lymphs Abs: 1.9 10*3/uL (ref 0.7–4.0)
MCH: 28.2 pg (ref 26.0–34.0)
MCHC: 31.3 g/dL (ref 30.0–36.0)
MCV: 90 fL (ref 80.0–100.0)
Monocytes Absolute: 1.1 10*3/uL — ABNORMAL HIGH (ref 0.1–1.0)
Monocytes Relative: 9 %
Neutro Abs: 8.7 10*3/uL — ABNORMAL HIGH (ref 1.7–7.7)
Neutrophils Relative %: 73 %
Platelets: 259 10*3/uL (ref 150–400)
RBC: 4.19 MIL/uL (ref 3.87–5.11)
RDW: 14.8 % (ref 11.5–15.5)
WBC: 11.9 10*3/uL — ABNORMAL HIGH (ref 4.0–10.5)
nRBC: 0 % (ref 0.0–0.2)

## 2022-05-17 LAB — BASIC METABOLIC PANEL
Anion gap: 9 (ref 5–15)
BUN: 15 mg/dL (ref 8–23)
CO2: 30 mmol/L (ref 22–32)
Calcium: 9.4 mg/dL (ref 8.9–10.3)
Chloride: 103 mmol/L (ref 98–111)
Creatinine, Ser: 0.81 mg/dL (ref 0.44–1.00)
GFR, Estimated: >60 mL/min (ref >60)
Glucose, Bld: 100 mg/dL — ABNORMAL HIGH (ref 70–99)
Potassium: 3.5 mmol/L (ref 3.5–5.1)
Sodium: 142 mmol/L (ref 135–145)

## 2022-05-17 MED ORDER — PROPOFOL 10 MG/ML IV BOLUS
INTRAVENOUS | Status: AC | PRN
  Administered 2022-05-17: 50 mg via INTRAVENOUS

## 2022-05-17 MED ORDER — PROPOFOL 10 MG/ML IV BOLUS
100.0000 mg | Freq: Once | INTRAVENOUS | Status: DC
  Filled 2022-05-17: qty 20

## 2022-05-17 MED ORDER — FENTANYL CITRATE PF 50 MCG/ML IJ SOSY
50.0000 ug | PREFILLED_SYRINGE | Freq: Once | INTRAMUSCULAR | Status: AC
  Administered 2022-05-17: 50 ug via INTRAVENOUS
  Filled 2022-05-17: qty 1

## 2022-05-17 MED ORDER — PROPOFOL 10 MG/ML IV BOLUS
INTRAVENOUS | Status: AC | PRN
  Administered 2022-05-17: 25 mg via INTRAVENOUS

## 2022-05-17 MED ORDER — ONDANSETRON HCL 4 MG/2ML IJ SOLN
4.0000 mg | Freq: Once | INTRAMUSCULAR | Status: AC
  Administered 2022-05-17: 4 mg via INTRAVENOUS
  Filled 2022-05-17: qty 2

## 2022-05-17 MED ORDER — ONDANSETRON 4 MG PO TBDP: 4.0000 mg | ORAL_TABLET | Freq: Three times a day (TID) | ORAL | 0 refills | Status: DC | PRN

## 2022-05-17 MED ORDER — KETAMINE HCL 10 MG/ML IJ SOLN
INTRAMUSCULAR | Status: AC | PRN
  Administered 2022-05-17 (×2): 25 mg via INTRAVENOUS

## 2022-05-17 MED ORDER — OXYCODONE HCL 5 MG PO TABS: 5.0000 mg | ORAL_TABLET | Freq: Four times a day (QID) | ORAL | 0 refills | Status: DC | PRN

## 2022-05-17 MED ORDER — KETAMINE HCL 10 MG/ML IJ SOLN
25.0000 mg | INTRAMUSCULAR | Status: DC | PRN
  Filled 2022-05-17: qty 1

## 2022-05-17 NOTE — ED Notes (Signed)
Patient ambulated to restroom with steady gait.

## 2022-05-17 NOTE — ED Triage Notes (Signed)
On 2 L o2 home O2

## 2022-05-17 NOTE — ED Notes (Signed)
Patient continues to visit with family at bedside. Appropriate mentation. Bed in lowest position and call light within reach.

## 2022-05-17 NOTE — ED Notes (Signed)
Bladder scan pt post void, 1st time 26 ml, 2nd time 34 ml, 3rd time 0 ml.

## 2022-05-17 NOTE — ED Provider Notes (Addendum)
[facility] AT [facility] Provider Note   CSN: [id number] Arrival date & time: 05/17/22  1106     History  Chief Complaint  Patient presents with   Hip Pain    Priscilla Cantrell is a 87 y.o. female.  With PMH of COPD on oxygen at baseline, hypothyroidism, HTN presents with right hip pain and deformity after twisting her hip the wrong way when trying to pet her dog today.  Patient was on the floor petting her dog when she tried to step up and her hip twisted and she felt a pop and could not stand or walk.  She did not hit her head or lose consciousness.  She only has pain in her right hip.  No weakness, no numbness or tingling or other complaints.  Her significant other witnessed this event and denies any other traumatic injuries.  She has had previous hip dislocation which required anesthesia.  She denies any anesthesia side effects.   Hip Pain       Home Medications Prior to Admission medications   Medication Sig Start Date End Date Taking? Authorizing Provider  ondansetron (ZOFRAN-ODT) 4 MG disintegrating tablet Take 1 tablet (4 mg total) by mouth every 8 (eight) hours as needed for vomiting or nausea. 05/17/22  Yes Mardene Sayer, MD  oxyCODONE (ROXICODONE) 5 MG immediate release tablet Take 1 tablet (5 mg total) by mouth every 6 (six) hours as needed for up to 5 doses for severe pain. 05/17/22  Yes Mardene Sayer, MD  albuterol (PROAIR HFA) 108 (90 Base) MCG/ACT inhaler Inhale 2 puffs into the lungs every 4 (four) hours as needed for wheezing or shortness of breath. 01/08/22   Coralyn Helling, MD  albuterol (PROVENTIL) (2.5 MG/3ML) 0.083% nebulizer solution Take 3 mLs (2.5 mg total) by nebulization every 6 (six) hours as needed for wheezing or shortness of breath. 09/09/21   Coralyn Helling, MD  alendronate (FOSAMAX) 70 MG tablet TAKE 1 TABLET(70 MG) BY MOUTH 1 TIME A WEEK WITH A FULL GLASS OF WATER AND ON AN EMPTY STOMACH 09/10/20   McGowen, Maryjean Morn, MD   Artificial Tear Solution (SOOTHE XP) SOLN Place 1 drop into both eyes 2 (two) times daily as needed (irritation).    [provider]  aspirin EC 81 MG tablet Take 81 mg by mouth daily.    [provider]  azelastine (ASTELIN) 0.1 % nasal spray Place 2 sprays into both nostrils 2 (two) times daily. Use in each nostril as directed 03/03/22   McGowen, Maryjean Morn, MD  cetirizine (ZYRTEC) 10 MG tablet Take 1 tablet (10 mg total) by mouth daily. Patient taking differently: Take 10 mg by mouth daily as needed for allergies. 05/22/20   McGowen, Maryjean Morn, MD  ciprofloxacin (CILOXAN) 0.3 % ophthalmic solution Place 1 drop into the left eye every 4 (four) hours while awake. Administer 1 drop, every 2 hours, while awake, for 2 days. Then 1 drop, every 4 hours, while awake, for the next 5 days. 09/28/21   Jacalyn Lefevre, MD  clobetasol (TEMOVATE) 0.05 % external solution APPLY TO THE AFFECTED AREA EVERY DAY 03/08/20   Sheffield, Harvin Hazel R, PA-C  cycloSPORINE (RESTASIS OP) Place 1 drop into both eyes daily as needed (dry eyes).    [provider]  diazepam (VALIUM) 5 MG tablet TAKE 1 TABLET BY MOUTH DAILY AS NEEDED FOR ANXIETY OR INSOMNIA Patient taking differently: Take 5 mg by mouth daily as needed for anxiety or sedation.  TAKE 1 TABLET BY MOUTH DAILY AS NEEDED FOR ANXIETY OR INSOMNIA 01/10/22   McGowen, Maryjean Morn, MD  fluticasone (CUTIVATE) 0.05 % cream Apply topically 2 (two) times daily. 05/15/21   McGowen, Maryjean Morn, MD  fluticasone (FLONASE) 50 MCG/ACT nasal spray Place 2 sprays into both nostrils daily. 05/22/20   McGowen, Maryjean Morn, MD  Fluticasone-Umeclidin-Vilant (TRELEGY ELLIPTA) 100-62.5-25 MCG/ACT AEPB Inhale 1 puff into the lungs daily. 04/17/22   Coralyn Helling, MD  furosemide (LASIX) 20 MG tablet 1 tab po twice a day as needed for LE swelling 03/03/22   McGowen, Maryjean Morn, MD  levothyroxine (SYNTHROID) 100 MCG tablet 1/2 tab on 4 days a week and continue whole tab all other day 05/09/22    McGowen, Maryjean Morn, MD  Lifitegrast Benay Spice) 5 % SOLN Place 1 drop into both eyes in the morning and at bedtime.    [provider]  Misc. Devices (PULSE OXIMETER) MISC Check oxygen level as needed 03/12/22   McGowen, Maryjean Morn, MD  pantoprazole (PROTONIX) 40 MG tablet Take 1 tablet (40 mg total) by mouth 2 (two) times daily. 05/07/22   McGowen, Maryjean Morn, MD  predniSONE (DELTASONE) 10 MG tablet 2 tabs p.o. daily x 5 days then 1 tab p.o. daily x 5 days 05/16/22   Jeoffrey Massed, MD  roflumilast (DALIRESP) 500 MCG TABS tablet Take 1 tablet (500 mcg total) by mouth daily. 05/31/20   Glenford Bayley, NP  spironolactone (ALDACTONE) 25 MG tablet TAKE 1 AND 1/2 TABLETS BY MOUTH TWICE DAILY 04/30/22   McGowen, Maryjean Morn, MD  triamcinolone cream (KENALOG) 0.1 % Apply 1 application topically 2 (two) times daily. 08/17/20   Kuneff, Renee A, DO  venlafaxine (EFFEXOR) 50 MG tablet Take 1 tablet (50 mg total) by mouth 2 (two) times daily. 04/23/22   McGowen, Maryjean Morn, MD  Vibegron (GEMTESA) 75 MG TABS Take 1 capsule by mouth daily. 11/23/20   McKenzie, Mardene Celeste, MD  vitamin B-12 (CYANOCOBALAMIN) 100 MCG tablet Take 100 mcg by mouth daily.    [provider]  VITAMIN D PO Take 1 tablet by mouth daily.    [provider]      Allergies    Losartan, Penicillins, Cefixime [kdc:cefixime], Gabapentin, Indocin [indomethacin], Singulair [montelukast sodium], Sulfa antibiotics, Ace inhibitors, and Statins    Review of Systems   Review of Systems  Physical Exam Updated Vital Signs BP (!) 151/53   Pulse (!) 54   Temp 98.7 F (37.1 C) (Oral)   Resp (!) 22   Ht  (1.549 m)   Wt 52.6 kg   SpO2 98%   BMI 21.92 kg/m  Physical Exam Constitutional: Alert and oriented.  Uncomfortable but nontoxic Eyes: Conjunctivae are normal. ENT      Head: Normocephalic and atraumatic. Cardiovascular: S1, S2, bradycardic, equal palpable bilateral DP pulses, right lower extremity warm  well-perfused Respiratory: Normal respiratory effort.  O2 sat 98 on home 2 L nasal cannula Gastrointestinal: Soft and nontender.  Musculoskeletal:       Right lower leg: Tenderness to palpation of right hip with internally rotated shortened hip with decreased range of motion.  Sensation intact distally.  No deformity or tenderness distal to right hip.  Palpable right DP pulses.      Left lower leg: No tenderness or edema. Neurologic: Normal speech and language. No gross focal neurologic deficits are appreciated. Skin: Skin is warm, dry and intact. No rash noted. Psychiatric: Mood and affect are normal. Speech and  behavior are normal.  ED Results / Procedures / Treatments   Labs (all labs ordered are listed, but only abnormal results are displayed) Labs Reviewed  CBC WITH DIFFERENTIAL/PLATELET - Abnormal; Notable for the following components:      Result Value   WBC 11.9 (*)    Hemoglobin 11.8 (*)    Neutro Abs 8.7 (*)    Monocytes Absolute 1.1 (*)    All other components within normal limits  BASIC METABOLIC PANEL - Abnormal; Notable for the following components:   Glucose, Bld 100 (*)    All other components within normal limits    EKG None  Radiology DG Hip Unilat W or Wo Pelvis 1 View Right  Result Date: 05/17/2022 CLINICAL DATA:  Dislocated right hip prosthesis. Status post reduction. EXAM: DG HIP (WITH OR WITHOUT PELVIS) 1V RIGHT COMPARISON:  Prior today. FINDINGS: There has been successful reduction of the previously seen dislocation of the right hip prosthesis. No fracture identified. IMPRESSION: Successful reduction of previously seen dislocation of right hip prosthesis. Electronically Signed   By: Danae Orleans M.D.   On: 05/17/2022 13:18   DG Knee 2 Views Right  Result Date: 05/17/2022 CLINICAL DATA:  Hip dislocation EXAM: RIGHT KNEE - 1-2 VIEW COMPARISON:  None Available. FINDINGS: Bones appear demineralized. No evidence of fracture, dislocation, or joint effusion. No  evidence of significant arthropathy or other focal bone abnormality. Soft tissues are unremarkable. IMPRESSION: Negative. Electronically Signed   By: Duanne Guess D.O.   On: 05/17/2022 12:12   DG Hip Unilat W or Wo Pelvis 2-3 Views Right  Result Date: 05/17/2022 CLINICAL DATA:  RIGHT hip pain. EXAM: DG HIP (WITH OR WITHOUT PELVIS) 2-3V RIGHT COMPARISON:  Plain film of the pelvis dated 12/25/2015. FINDINGS: Superior dislocation of the humeral head component of the RIGHT hip prosthesis relative to the acetabular cup. No osseous fracture line or osseous fracture fragment is identified. IMPRESSION: Superior dislocation of the humeral head component of the RIGHT hip prosthesis relative to the acetabular cup. Electronically Signed   By: Bary Richard M.D.   On: 05/17/2022 12:12    Procedures .Sedation  Date/Time: 05/17/2022 3:49 PM  Performed by: Mardene Sayer, MD Authorized by: Mardene Sayer, MD   Consent:    Consent obtained:  Written   Consent given by:  Patient   Risks discussed:  Prolonged hypoxia resulting in organ damage, prolonged sedation necessitating reversal, dysrhythmia, inadequate sedation, respiratory compromise necessitating ventilatory assistance and intubation, vomiting and nausea   Alternatives discussed:  Analgesia without sedation Universal protocol:    Procedure explained and questions answered to patient or proxy's satisfaction: yes     Immediately prior to procedure, a time out was called: yes   Indications:    Procedure performed:  Dislocation reduction   Procedure necessitating sedation performed by:  Physician performing sedation Pre-sedation assessment:    Time since last food or drink:  0800 AM   ASA classification: class 3 - patient with severe systemic disease     Mouth opening:  3 or more finger widths   Mallampati score:  II - soft palate, uvula, fauces visible   Pre-sedation assessments completed and reviewed: airway patency, cardiovascular  function, mental status, nausea/vomiting, pain level, respiratory function and temperature     Pre-sedation assessment completed:  05/17/2022 12:34 PM Immediate pre-procedure details:    Reassessment: Patient reassessed immediately prior to procedure     Reviewed: vital signs, relevant labs/tests and NPO status  Verified: bag valve mask available, emergency equipment available, intubation equipment available, IV patency confirmed and oxygen available   Procedure details (see MAR for exact dosages):    Preoxygenation:  Nasal cannula   Sedation:  Propofol and ketamine   Intended level of sedation: deep   Intra-procedure monitoring:  Blood pressure monitoring, continuous capnometry, frequent LOC assessments, frequent vital sign checks, continuous pulse oximetry and cardiac monitor   Intra-procedure events: respiratory depression     Intra-procedure events comment:  Reuqiring intermittent BVM   Intra-procedure management:  Airway repositioning and supplemental oxygen   Total Provider sedation time (minutes):  31 Post-procedure details:    Post-sedation assessment completed:  05/17/2022 1:05 PM   Attendance: Constant attendance by certified staff until patient recovered     Recovery: Patient returned to pre-procedure baseline     Post-sedation assessments completed and reviewed: airway patency, cardiovascular function, mental status, nausea/vomiting, pain level and respiratory function     Patient is stable for discharge or admission: yes     Procedure completion:  Tolerated well, no immediate complications .Ortho Injury Treatment  Date/Time: 05/17/2022 3:52 PM  Performed by: Mardene Sayer, MD Authorized by: Mardene Sayer, MD   Consent:    Consent obtained:  Written   Consent given by:  Patient   Risks discussed:  Irreducible dislocation   Alternatives discussed:  No treatmentInjury location: hip Location details: right hip Injury type: dislocation Spontaneous dislocation:  yes Prosthesis: yes Pre-procedure neurovascular assessment: neurovascularly intact Pre-procedure distal perfusion: normal Pre-procedure neurological function: normal Pre-procedure range of motion: reduced  Anesthesia: Local anesthesia used: no  Patient sedated: Yes. Refer to sedation procedure documentation for details of sedation. Manipulation performed: yes Reduction method: external rotation, extension, abduction and traction and counter traction Reduction successful: yes X-ray confirmed reduction: yes Immobilization: knee immobilizer. Splint Applied by: ED Tech Post-procedure neurovascular assessment: post-procedure neurovascularly intact Post-procedure distal perfusion: normal Post-procedure neurological function: normal Post-procedure range of motion: normal Comments: This was completed with assistance of PA Abigail Harris       Medications Ordered in ED Medications  propofol (DIPRIVAN) 10 mg/mL bolus/IV push 100 mg (100 mg Intravenous Not Given 05/17/22 1329)  ketamine (KETALAR) injection 25 mg (25 mg Intravenous Not Given 05/17/22 1330)  fentaNYL (SUBLIMAZE) injection 50 mcg (50 mcg Intravenous Given 05/17/22 1159)  ondansetron (ZOFRAN) injection 4 mg (4 mg Intravenous Given 05/17/22 1200)  propofol (DIPRIVAN) 10 mg/mL bolus/IV push (50 mg Intravenous Given 05/17/22 1243)  ketamine (KETALAR) injection (25 mg Intravenous Given 05/17/22 1300)  propofol (DIPRIVAN) 10 mg/mL bolus/IV push (25 mg Intravenous Given 05/17/22 1309)    ED Course/ Medical Decision Making/ A&P Clinical Course as of 05/17/22 1557  Sat May 17, 2022  1532 Patient ambulated to the bathroom with no issues. [VB]    Clinical Course User Index [VB] Mardene Sayer, MD                             Medical Decision Making Priscilla Cantrell is a 87 y.o. female.  With PMH of COPD on oxygen at baseline, hypothyroidism, HTN presents with right hip pain and deformity after twisting her hip the wrong way when  trying to pet her dog today.    Patient had plain films of right hip obtained which I personally reviewed showing superiorly dislocated right hip prosthesis.  Knee x-ray without any acute traumatic injury.  She had no other injuries on exam.  She  had no head trauma or loss of consciousness.  Baseline labs obtained generally unremarkable.  Glucose 100.  Mild chronic anemia hemoglobin 11.8.  See procedure notes.  Patient sedated and successfully reduced patient's prosthetic right hip joint with assistance of PA Abigail Harris.  Called on-call orthopedist Dion Saucier who recommended weightbearing as tolerated.  She will follow-up with her home orthopedist Dr. Charlann Boxer which I have placed in her discharge paperwork.  She was able to ambulate successfully with a knee immobilizer after procedure.  Pain was well-tolerated.  She was observed until back to preprocedure baseline.  She had no severe complications.  She was offered home health and wheelchair which she politely declined.  Strict return precautions discussed.  Patient discharged in stable condition.  Amount and/or Complexity of Data Reviewed Labs: ordered. Radiology: ordered.  Risk Prescription drug management.      Final Clinical Impression(s) / ED Diagnoses Final diagnoses:  Dislocation of right hip, initial encounter  History of conscious sedation    Rx / DC Orders ED Discharge Orders          Ordered    ondansetron (ZOFRAN-ODT) 4 MG disintegrating tablet  Every 8 hours PRN        05/17/22 1513    oxyCODONE (ROXICODONE) 5 MG immediate release tablet  Every 6 hours PRN        05/17/22 1535              Mardene Sayer, MD 05/17/22 1556    Mardene Sayer, MD 05/17/22 1557

## 2022-05-17 NOTE — Care Management (Signed)
Called paitent to discuss discharge planning and needs. She lives at home is very active ( has 4 [employer]) she does use oxygen at home for COPD, oxygen is with [employer]. Discussed DME and Home Health, patient expresses that she does not need home health or any DME.  Messaged with RN and provider to let them know of conversation.

## 2022-05-17 NOTE — ED Triage Notes (Signed)
When got up felt right hip pop out.  Past hip replacement

## 2022-05-17 NOTE — Discharge Instructions (Addendum)
You were seen for a dislocated prosthetic hip joint.  We were able to successfully put it back into place in the ER.  Continue to wear the knee immobilizer until you follow-up with the orthopedist listed in your discharge paperwork.  Call today to make your appointment.  It is normal to feel some pain after this procedure today.  You can take your normal home medications but if pain is still severe you can take an oxycodone as needed.  Do not take with alcohol or while driving a vehicle.  You can take the Zofran as needed for nausea or vomiting.  You can bear weight and walk as tolerated based on your pain.  You were also given conscious sedation today.  Read the instructions about conscious sedation today.  Come back if any severe worsening pain, repeated falls, loss of sensation, weakness, or any other symptoms concerning to you.  You were given narcotic and or sedative medications while in the emergency department. Do not drive. Do not use machinery or power tools. Do not sign legal documents. Do not drink alcohol. Do not take sleeping pills. Do not supervise children by yourself. Do not participate in activities that require climbing or being in high places.

## 2022-05-17 NOTE — ED Provider Notes (Signed)
Reduction of dislocation  Date/Time: 05/17/2022 1:10 PM  Performed by: Arthor Captain, PA-C Authorized by: Arthor Captain, PA-C  Risks and benefits: risks, benefits and alternatives were discussed Consent given by: patient Patient identity confirmed: hospital-assigned identification number and verbally with patient Time out: Immediately prior to procedure a "time out" was called to verify the correct patient, procedure, equipment, support staff and site/side marked as required.  Sedation: Patient sedated: see note by Dr. Elpidio Anis.  Comments: Successful reduction of R prosthetic hip dislocation. NVI       Arthor Captain, PA-C 05/17/22 1311    Mardene Sayer, MD 05/18/22 [phone]

## 2022-05-19 ENCOUNTER — Other Ambulatory Visit: Payer: Self-pay | Admitting: Pulmonary Disease

## 2022-05-19 MED ORDER — TRELEGY ELLIPTA 100-62.5-25 MCG/ACT IN AEPB: 1.0000 | INHALATION_SPRAY | Freq: Every day | RESPIRATORY_TRACT | 3 refills | Status: DC

## 2022-05-21 ENCOUNTER — Telehealth: Payer: Self-pay

## 2022-05-21 NOTE — Telephone Encounter (Signed)
        Patient  visited [facility] on 05/17/2022  for Dislocation of right hip   Telephone encounter attempt :  2nd  A HIPAA compliant voice message was left requesting a return call.  Instructed patient to call back at [phone].   Maurizio Geno [facility]  [facility] Population Health Community Resource Care Guide   [email or web address]  ?? [phone]   Website: [email or web address]  [email or web address]

## 2022-05-21 NOTE — Telephone Encounter (Signed)
        Patient  visited [facility] on 05/17/2022  for Dislocation of right hip.   Telephone encounter attempt :  1st  A HIPAA compliant voice message was left requesting a return call.  Instructed patient to call back at [phone].   Margie Urbanowicz [facility]  [facility] Population Health Community Resource Care Guide   [email or web address]  ?? [phone]   Website: [email or web address]  [email or web address]

## 2022-05-21 NOTE — Telephone Encounter (Signed)
Since calling the office, pt has been seen at the office. Closing encounter.

## 2022-05-26 DIAGNOSIS — Z96643 Presence of artificial hip joint, bilateral: Secondary | ICD-10-CM | POA: Diagnosis not present

## 2022-05-28 ENCOUNTER — Encounter: Payer: Self-pay | Admitting: Family Medicine

## 2022-05-28 ENCOUNTER — Ambulatory Visit (INDEPENDENT_AMBULATORY_CARE_PROVIDER_SITE_OTHER): Payer: Medicare PPO | Admitting: Family Medicine

## 2022-05-28 VITALS — BP 140/70 | HR 78 | Wt 116.0 lb

## 2022-05-28 DIAGNOSIS — J9611 Chronic respiratory failure with hypoxia: Secondary | ICD-10-CM | POA: Diagnosis not present

## 2022-05-28 DIAGNOSIS — G3184 Mild cognitive impairment, so stated: Secondary | ICD-10-CM | POA: Diagnosis not present

## 2022-05-28 DIAGNOSIS — S73004D Unspecified dislocation of right hip, subsequent encounter: Secondary | ICD-10-CM | POA: Diagnosis not present

## 2022-05-28 DIAGNOSIS — R7989 Other specified abnormal findings of blood chemistry: Secondary | ICD-10-CM | POA: Diagnosis not present

## 2022-05-28 DIAGNOSIS — J449 Chronic obstructive pulmonary disease, unspecified: Secondary | ICD-10-CM | POA: Diagnosis not present

## 2022-05-28 DIAGNOSIS — E039 Hypothyroidism, unspecified: Secondary | ICD-10-CM | POA: Diagnosis not present

## 2022-05-28 NOTE — Progress Notes (Unsigned)
OFFICE VISIT  05/29/2022  CC:  Chief Complaint  Patient presents with   Follow-up    2 week follow up    Patient is a 87 y.o. female who presents with boyfriend Priscilla Cantrell for 2-week follow-up COPD exacerbation as well as follow-up recent emergency department visit. A/P as of last visit: "1) COPD exacerbation, chronic hypoxic respiratory failure. Improved but not back to baseline.  Will extend steroids a bit: Prednisone 20 mg a day x 5 days and then 10 mg a day x 5 days. She will continue [place] daily and Daliresp daily.  Albuterol every 6 hours as needed. Cont oxygen prn ambulation but ok to go w/out when at rest if sat >92% on RA.    2) Voice hoarseness/weakness: We started PPI recently as empiric treatment for possible LPR.  Will give these voice symptoms another month or so on this medicine.  If not improved at that time we may consider getting her to ENT for consideration of direct visualization of her larynx."  INTERIM HX: 05/17/2022 she dislocated her right hip and went to the emergency department and had it successfully reduced there. She got acute onset of pain and deformity in the hip when she was sitting and reached/stooped down to pet a dog.  She had to abduct and internally rotate the hip to do this motion and when she put upward force on it at that time it dislocated. She had immediate resolution of pain upon reduction in the emergency department. She was sent home with oxycodone but has not had to use any.  She followed up with Dr. Charlann Boxer and no changes were made.  From a respiratory standpoint she feels back to baseline.  Still wearing 2 L of oxygen but frequently during the day she takes it off, sometimes unknowingly and sometimes because she is frustrated with it. Usually sats are greater than 90% but occasionally with activity dropped into the 80s.  Over the last year or so she has had gradually increasing tendency to repeat herself and have short-term memory loss.  Sometimes  gets a bit confused. She no longer drives. Otherwise she does all activities of daily living unassisted. She has no problem with headaches or dizziness or tremor.  No ataxia, although her ambulation is slightly unsteady.  Past Medical History:  Diagnosis Date   Allergic rhinitis    with upper airway cough: as of 07/2017 pulm f/u she was instructed to use astelin, flonase, and saline more consistently   BPPV (benign paroxysmal positional vertigo) 03/2018   C. difficile colitis    Chest pain, non-cardiac    Cardiac CT showed no signif obstructive dz, mildly elevated calcium level (Dr. Jens Som).   Chronic cystitis with hematuria    Dr. Lenon Curt   Chronic hypoxemic respiratory failure 02/02/2015   2L oxygen 24/7   Chronic renal insufficiency, stage 2 (mild)    GFR approx 60   Cold hands    NCS/EMGs normal.  Hand dysesthesias per neuro--reassured   COPD (chronic obstructive pulmonary disease)    GOLD 4.  spirometry 01/10/09 FEV 0.97(52%), FEV1% 47.  With chronic bronchitis as of 07/2017.   DDD (degenerative disc disease), cervical    1998 MRI C5-6 impingemt (left).  MRI 2021 (neurol) 1 level canal stenosis, 1 level foraminal stenosis--->PT   DDD (degenerative disc disease), lumbar    Depression with anxiety 04/15/2011   Diarrheal disease Summer 2017   ? 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 Dr Kinnie Scales).  As of 10/05/15 pt reports GI dx'd her with C diff and rx'd more flagyl and she is also on cholestyramine.  As of 02/2016, pt's sx's resolved completely.   Diverticulosis of colon    Procto '97 and colonoscopy 2002   Dysplastic nevus of upper extremity 04/2014   R tricep (Dr. Jorja Loa)   Edema of both lower extremities due to peripheral venous insufficiency    Varicosities bilat, swelling L>R.  R baker's cyst.  Got vein clinic eval 02/2018->sclerotherapy recommended but since no hemorrhage or  ulceration, insurance will not cover the procedure.   Family history of adverse reaction to anesthesia    mother died when patient age 64 receiving anesthesia for thyroid surgery   Fibrocystic breast disease    w/fibroadenoma.  Bx's showed NO atypia (Dr. Jamey Ripa).  Mammo neg 2008.   GERD (gastroesophageal reflux disease)    History of double vision    Ophthalmologist, Dr. Elmer Picker, is further evaluating this with MRI  orbits and limited brain (myesthenia gravis testing neg 07/2010)   History of home oxygen therapy    uses 2 liters at hs   Hypertension    EKG 03/2010 normal   Hypothyroidism    Hashimoto's, dx'd 1989   Idiopathic peripheral neuropathy 04/2018   Sensory (numb feet) S1 distribution bilat, c/w spinal stenosis sensory neuropathy (no pain). 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  Labs [place], MRI C spine->some spondylosis/stenosis.  UE NCS/EMS: normal 04/2019.   Lesion of right native kidney 2017   found on CT 02/2015.  F/u MRI 03/31/15 showed it to be smaller and less likely of any significance but repeat CT renal protocol in 65mo was recommended by radiology.  This was done 10/26/15 and showed no interval change in the lesion, but b/c the lesion enhances with contrast, radiol rec'd urol consult (b/c pap renal RCC could not be excluded).  Saw urol 03/06/16--stable on u/s 09/2016 and 09/2017.   OAB (overactive bladder)    Osteoarthritis, hip, bilateral    she is s/p bilat THA as of 02/2018.   Osteoporosis    radius, T score -2.5->rec'd alendronate 08/2019->plan rpt DEXA 08/2021.   Pneumonia    Postmenopausal atrophic vaginitis    improved with estrace 2 X/week.   Prolapse of vaginal cuff after hysterectomy    10/2018: Dr. Katherina Right. Mild colpocele 04/2019 per GYN (no surgery)   Pulmonary nodule    CT chest 12/10, June 2011.  No nodule on CT 06/2010.   Recurrent Clostridium difficile diarrhea    Episode 09/19/2016: resolved with  prolonged course of flagyl.  11/2016 recurrence treated with 10d of Dificid (Fidaxomicin) by GI (Dr. Kinnie Scales).   Recurrent UTI    likely related to pt's atrophic vaginitis.  Urol started vaginal estrace 03/2016.   Rib fracture 09/2018   R 7th, laterally-->sustained when her dog pulled her over and she fell.   RLS (restless legs syndrome)    neuro->gabapentin trial 2020/21   Shingles 02/2014   R abd/flank   Spinal stenosis of lumbar region with neurogenic claudication    2008 MRI (L3-4, L4-5, L5-S1).  +S1 distribution sensory loss bilat    Past Surgical History:  Procedure Laterality Date   ABDOMINAL HYSTERECTOMY  1978   no BSO per pt--nonmalignant reasons   APPENDECTOMY     BREAST CYST EXCISION     Last screening mammogram 10/2010 was normal.   CHOLECYSTECTOMY  2001   COLONOSCOPY  04/2005; 12/06/2015   2007 NORMAL.  2017 adenomatous polyp x 1.  Mod sigmoid diverticulosis (Dr. Kinnie Scales).     DEXA  05/18/2017; 08/7019   2019 NORMAL (T-score 0.1).  08/2019->T score -2.5->alendronate started->plan Rpt 08/2021.  03/2022 T score -2.7   HIP CLOSED REDUCTION Right 12/25/2015   Procedure: CLOSED REDUCTION HIP;  Surgeon: Yolonda Kida, MD;  Location: [facility];  Service: Orthopedics;  Laterality: Right;   NCV/EMS  10/2018   chronic and symmetric sensorimotor axonal polyneuropathy affecting the lower extremities   TONSILLECTOMY     TOTAL HIP ARTHROPLASTY Right 11/01/2014   Procedure: RIGHT TOTAL HIP ARTHROPLASTY;  Surgeon: Ranee Gosselin, MD;  Location: [facility];  Service: Orthopedics;  Laterality: Right;   TOTAL HIP ARTHROPLASTY Left 12/08/2017   Procedure: LEFT TOTAL HIP ARTHROPLASTY ANTERIOR APPROACH;  Surgeon: Durene Romans, MD;  Location: [facility];  Service: Orthopedics;  Laterality: Left;  70 mins   TRANSTHORACIC ECHOCARDIOGRAM  08/2014   2016 EF 60-65%, grade I diast dysfxn. 06/21/21 unchanged.   TUBAL LIGATION  1974    No facility-administered medications prior to visit.   Outpatient  Medications Prior to Visit  Medication Sig Dispense Refill   albuterol (PROAIR HFA) 108 (90 Base) MCG/ACT inhaler Inhale 2 puffs into the lungs every 4 (four) hours as needed for wheezing or shortness of breath. 8.5 g 1   albuterol (PROVENTIL) (2.5 MG/3ML) 0.083% nebulizer solution Take 3 mLs (2.5 mg total) by nebulization every 6 (six) hours as needed for wheezing or shortness of breath. 360 mL 5   alendronate (FOSAMAX) 70 MG tablet TAKE 1 TABLET(70 MG) BY MOUTH 1 TIME A WEEK WITH A FULL GLASS OF WATER AND ON AN EMPTY STOMACH 12 tablet 3   Artificial Tear Solution (SOOTHE XP) SOLN Place 1 drop into both eyes 2 (two) times daily as needed (irritation).     aspirin EC 81 MG tablet Take 81 mg by mouth daily.     azelastine (ASTELIN) 0.1 % nasal spray Place 2 sprays into both nostrils 2 (two) times daily. Use in each nostril as directed 30 mL 3   cetirizine (ZYRTEC) 10 MG tablet Take 1 tablet (10 mg total) by mouth daily. (Patient taking differently: Take 10 mg by mouth daily as needed for allergies.) 30 tablet 6   ciprofloxacin (CILOXAN) 0.3 % ophthalmic solution Place 1 drop into the left eye every 4 (four) hours while awake. Administer 1 drop, every 2 hours, while awake, for 2 days. Then 1 drop, every 4 hours, while awake, for the next 5 days. 5 mL 0   clobetasol (TEMOVATE) 0.05 % external solution APPLY TO THE AFFECTED AREA EVERY DAY 50 mL 4   cycloSPORINE (RESTASIS OP) Place 1 drop into both eyes daily as needed (dry eyes).     diazepam (VALIUM) 5 MG tablet TAKE 1 TABLET BY MOUTH DAILY AS NEEDED FOR ANXIETY OR INSOMNIA (Patient taking differently: Take 5 mg by mouth daily as needed for anxiety or sedation. TAKE 1 TABLET BY MOUTH DAILY AS NEEDED FOR ANXIETY OR INSOMNIA) 30 tablet 3   fluticasone (CUTIVATE) 0.05 % cream Apply topically 2 (two) times daily. 30 g 1   fluticasone (FLONASE) 50 MCG/ACT nasal spray Place 2 sprays into both nostrils daily. 16 g 6   Fluticasone-Umeclidin-Vilant (TRELEGY  ELLIPTA) 100-62.5-25 MCG/ACT AEPB Inhale 1 puff into the lungs daily. 180 each 3   furosemide (LASIX) 20 MG tablet 1 tab po twice a day as needed for LE swelling 60 tablet  1   levothyroxine (SYNTHROID) 100 MCG tablet 1/2 tab on 4 days a week and continue whole tab all other day 90 tablet 1   Lifitegrast (XIIDRA) 5 % SOLN Place 1 drop into both eyes in the morning and at bedtime.     Misc. Devices (PULSE OXIMETER) MISC Check oxygen level as needed 1 each 1   ondansetron (ZOFRAN-ODT) 4 MG disintegrating tablet Take 1 tablet (4 mg total) by mouth every 8 (eight) hours as needed for vomiting or nausea. 20 tablet 0   oxyCODONE (ROXICODONE) 5 MG immediate release tablet Take 1 tablet (5 mg total) by mouth every 6 (six) hours as needed for up to 5 doses for severe pain. 5 tablet 0   pantoprazole (PROTONIX) 40 MG tablet Take 1 tablet (40 mg total) by mouth 2 (two) times daily. 60 tablet 0   predniSONE (DELTASONE) 10 MG tablet 2 tabs p.o. daily x 5 days then 1 tab p.o. daily x 5 days 15 tablet 0   roflumilast (DALIRESP) 500 MCG TABS tablet Take 1 tablet (500 mcg total) by mouth daily. 90 tablet 3   spironolactone (ALDACTONE) 25 MG tablet TAKE 1 AND 1/2 TABLETS BY MOUTH TWICE DAILY 270 tablet 0   triamcinolone cream (KENALOG) 0.1 % Apply 1 application topically 2 (two) times daily. 45 g 2   venlafaxine (EFFEXOR) 50 MG tablet Take 1 tablet (50 mg total) by mouth 2 (two) times daily. 180 tablet 1   Vibegron (GEMTESA) 75 MG TABS Take 1 capsule by mouth daily. 30 tablet 11   vitamin B-12 (CYANOCOBALAMIN) 100 MCG tablet Take 100 mcg by mouth daily.     VITAMIN D PO Take 1 tablet by mouth daily.      Allergies  Allergen Reactions   Losartan Other (See Comments)    hyperkalemia   Penicillins Hives, Swelling and Rash    Has patient had a PCN reaction causing immediate rash, facial/tongue/throat swelling, SOB or lightheadedness with hypotension: YES Has patient had a PCN reaction causing severe rash involving  mucus membranes or skin necrosis: NO Has patient had a PCN reaction that required hospitalization NO Has patient had a PCN reaction occurring within the last 10 years: NO If all of the above answers are "NO", then may proceed with Cephalosporin use.    Cefixime [Kdc:Cefixime] Other (See Comments)    unspecified   Gabapentin Other (See Comments)    Feels woozy   Indocin [Indomethacin] Other (See Comments)    Painful tongue and throat   Singulair [Montelukast Sodium]     Chest tightness, decreased mental clarity   Sulfa Antibiotics Other (See Comments)    unspecified   Ace Inhibitors Other (See Comments)    cough   Statins Other (See Comments)    Myalgias     Review of Systems As per HPI  PE:    05/29/2022    9:31 AM 05/28/2022    3:18 PM 05/28/2022    3:05 PM  Vitals with BMI  Weight   116 lbs  BMI   21.93  Systolic 198 140 161  Diastolic 72 70 73  Pulse 60  78  02 sat 95% RA  Physical Exam  General: Alert, well-appearing, attentive and interactive.  Lucid thought and speech. CV: RRR, no m/r/g.   LUNGS: CTA bilat, nonlabored resps, good aeration in all lung fields. EXT: no clubbing or cyanosis.  no edema.    LABS:  Last CBC Lab Results  Component Value Date   WBC  11.9 (H) 05/17/2022   HGB 11.8 (L) 05/17/2022   HCT 37.7 05/17/2022   MCV 90.0 05/17/2022   MCH 28.2 05/17/2022   RDW 14.8 05/17/2022   PLT 259 05/17/2022   Lab Results  Component Value Date   FERRITIN 68.9 12/06/2014   Last metabolic panel Lab Results  Component Value Date   GLUCOSE 100 (H) 05/17/2022   NA 142 05/17/2022   K 3.5 05/17/2022   CL 103 05/17/2022   CO2 30 05/17/2022   BUN 15 05/17/2022   CREATININE 0.81 05/17/2022   GFRNONAA >60 05/17/2022   CALCIUM 9.4 05/17/2022   PHOS 3.8 01/31/2013   PROT 6.1 (L) 10/29/2021   ALBUMIN 3.1 (L) 10/29/2021   BILITOT 0.4 10/29/2021   ALKPHOS 58 10/29/2021   AST 18 10/29/2021   ALT 42 10/29/2021   ANIONGAP 9 05/17/2022   Last thyroid  functions Lab Results  Component Value Date   TSH 0.01 Repeated and verified X2. (L) 05/07/2022   T3TOTAL 97.2 07/11/2010   T4TOTAL 12.4 07/11/2010   Lab Results  Component Value Date   VITAMINB12 314 10/12/2018   IMPRESSION AND PLAN:  #1 chronic hypoxic respiratory failure, COPD, now stable. Continue 2 L oxygen 24/7 or at least wear when active in the daytime and overnight. Continue Trelegy Ellipta, Daliresp, and albuterol as needed. She has follow-up with Dr. Craige Cotta soon.  #2 status post right hip dislocation.  Successful reduction in the emergency department. Doing well.  #3 hypothyroidism.  Most recent TSH 3 weeks ago was quite low. I recommended at that time that she decrease levothyroxine to 1/2 tab on 4 days a week and continue whole tab all other days  Needs TSH recheck approx 5/15.  #4 mild cognitive impairment with memory loss. Neurology referral ordered today.  An After Visit Summary was printed and given to the patient.  FOLLOW UP: Return in about 3 months (around 08/27/2022) for routine chronic illness f/u.  Signed:  Santiago Bumpers, MD           05/29/2022

## 2022-05-29 ENCOUNTER — Emergency Department (HOSPITAL_BASED_OUTPATIENT_CLINIC_OR_DEPARTMENT_OTHER): Payer: Medicare PPO | Admitting: Radiology

## 2022-05-29 ENCOUNTER — Emergency Department (HOSPITAL_BASED_OUTPATIENT_CLINIC_OR_DEPARTMENT_OTHER)
Admission: EM | Admit: 2022-05-29 | Discharge: 2022-05-29 | Disposition: A | Discharge status: Home / Self Care | Payer: Medicare PPO | Attending: Emergency Medicine | Admitting: Emergency Medicine

## 2022-05-29 ENCOUNTER — Other Ambulatory Visit: Payer: Self-pay

## 2022-05-29 ENCOUNTER — Encounter (HOSPITAL_BASED_OUTPATIENT_CLINIC_OR_DEPARTMENT_OTHER): Payer: Self-pay | Admitting: *Deleted

## 2022-05-29 DIAGNOSIS — Z7982 Long term (current) use of aspirin: Secondary | ICD-10-CM | POA: Insufficient documentation

## 2022-05-29 DIAGNOSIS — S73004A Unspecified dislocation of right hip, initial encounter: Secondary | ICD-10-CM | POA: Diagnosis not present

## 2022-05-29 DIAGNOSIS — M25551 Pain in right hip: Secondary | ICD-10-CM | POA: Diagnosis not present

## 2022-05-29 DIAGNOSIS — Z96641 Presence of right artificial hip joint: Secondary | ICD-10-CM | POA: Diagnosis not present

## 2022-05-29 DIAGNOSIS — I10 Essential (primary) hypertension: Secondary | ICD-10-CM | POA: Diagnosis not present

## 2022-05-29 DIAGNOSIS — W19XXXA Unspecified fall, initial encounter: Secondary | ICD-10-CM | POA: Diagnosis not present

## 2022-05-29 DIAGNOSIS — W182XXA Fall in (into) shower or empty bathtub, initial encounter: Secondary | ICD-10-CM | POA: Insufficient documentation

## 2022-05-29 DIAGNOSIS — T84020A Dislocation of internal right hip prosthesis, initial encounter: Secondary | ICD-10-CM | POA: Diagnosis not present

## 2022-05-29 DIAGNOSIS — Z471 Aftercare following joint replacement surgery: Secondary | ICD-10-CM | POA: Diagnosis not present

## 2022-05-29 MED ORDER — PROPOFOL 10 MG/ML IV BOLUS
0.5000 mg/kg | Freq: Once | INTRAVENOUS | Status: DC
  Filled 2022-05-29: qty 20

## 2022-05-29 MED ORDER — PROPOFOL 10 MG/ML IV BOLUS
INTRAVENOUS | Status: AC | PRN
  Administered 2022-05-29 (×2): 25 mg via INTRAVENOUS

## 2022-05-29 NOTE — Discharge Instructions (Addendum)
You were seen in the emergency department today for dislocated hip prosthesis. You were sedated with propofol.  Your hip was relocated with.  X-rays after the relocation show good relocation of your hip joint. Continue your usual pain medications at home and use cold therapy. Please keep the knee immobilizer  in place Please call your orthopedist today for follow-up

## 2022-05-29 NOTE — Sedation Documentation (Signed)
Pt rates pain 2/10

## 2022-05-29 NOTE — ED Notes (Signed)
Pt discharged to home using teachback Method. Discharge instructions have been discussed with patient and/or family members. Pt verbally acknowledges understanding d/c instructions, has been given opportunity for questions to be answered, and endorses comprehension to checkout at registration before leaving.  Pt taken home by Charlean Sanfilippo, pt friend

## 2022-05-29 NOTE — Sedation Documentation (Signed)
Pt unable to rate pain due to sedation 

## 2022-05-29 NOTE — ED Triage Notes (Signed)
Pt slipped and fell getting out of the shower and reports feels like she dislocated the right hip.  Deformity noted.  Pt had fentanuyl pta.  Pt is on home O2 at 2L [place].

## 2022-05-29 NOTE — Sedation Documentation (Signed)
Imaging called for post reduction xray

## 2022-05-29 NOTE — Sedation Documentation (Signed)
Pt rates pain 10/10 

## 2022-05-29 NOTE — ED Provider Notes (Signed)
[place] EMERGENCY DEPARTMENT AT [facility] Provider Note   CSN: [id number] Arrival date & time: 05/29/22  9562     History  Chief Complaint  Patient presents with   Marletta Lor    SOUL DEVENEY is a 87 y.o. female.  HPI 87  yo female presents today with right hip pain.  Patient had fall today.  She states that she landed on her right side.  She is having severe right hip pain.  She denies injury to her head or her neck.  She is not on any blood thinners.  She denies any other injuries has no other complaints.  Review of her records show she has had a previous dislocated hip. Review of record from 05/17/2022 shows dislocated hip prosthesis at that time.  It was reduced in the emergency department.  Sedation agents were ketamine and propofol.    Home Medications Prior to Admission medications   Medication Sig Start Date End Date Taking? Authorizing Provider  albuterol (PROAIR HFA) 108 (90 Base) MCG/ACT inhaler Inhale 2 puffs into the lungs every 4 (four) hours as needed for wheezing or shortness of breath. 01/08/22   Coralyn Helling, MD  albuterol (PROVENTIL) (2.5 MG/3ML) 0.083% nebulizer solution Take 3 mLs (2.5 mg total) by nebulization every 6 (six) hours as needed for wheezing or shortness of breath. 09/09/21   Coralyn Helling, MD  alendronate (FOSAMAX) 70 MG tablet TAKE 1 TABLET(70 MG) BY MOUTH 1 TIME A WEEK WITH A FULL GLASS OF WATER AND ON AN EMPTY STOMACH 09/10/20   McGowen, Maryjean Morn, MD  Artificial Tear Solution (SOOTHE XP) SOLN Place 1 drop into both eyes 2 (two) times daily as needed (irritation).    [provider]  aspirin EC 81 MG tablet Take 81 mg by mouth daily.    [provider]  azelastine (ASTELIN) 0.1 % nasal spray Place 2 sprays into both nostrils 2 (two) times daily. Use in each nostril as directed 03/03/22   McGowen, Maryjean Morn, MD  cetirizine (ZYRTEC) 10 MG tablet Take 1 tablet (10 mg total) by mouth daily. Patient taking differently: Take 10 mg by  mouth daily as needed for allergies. 05/22/20   McGowen, Maryjean Morn, MD  ciprofloxacin (CILOXAN) 0.3 % ophthalmic solution Place 1 drop into the left eye every 4 (four) hours while awake. Administer 1 drop, every 2 hours, while awake, for 2 days. Then 1 drop, every 4 hours, while awake, for the next 5 days. 09/28/21   Jacalyn Lefevre, MD  clobetasol (TEMOVATE) 0.05 % external solution APPLY TO THE AFFECTED AREA EVERY DAY 03/08/20   Sheffield, Harvin Hazel R, PA-C  cycloSPORINE (RESTASIS OP) Place 1 drop into both eyes daily as needed (dry eyes).    [provider]  diazepam (VALIUM) 5 MG tablet TAKE 1 TABLET BY MOUTH DAILY AS NEEDED FOR ANXIETY OR INSOMNIA Patient taking differently: Take 5 mg by mouth daily as needed for anxiety or sedation. TAKE 1 TABLET BY MOUTH DAILY AS NEEDED FOR ANXIETY OR INSOMNIA 01/10/22   McGowen, Maryjean Morn, MD  fluticasone (CUTIVATE) 0.05 % cream Apply topically 2 (two) times daily. 05/15/21   McGowen, Maryjean Morn, MD  fluticasone (FLONASE) 50 MCG/ACT nasal spray Place 2 sprays into both nostrils daily. 05/22/20   McGowen, Maryjean Morn, MD  Fluticasone-Umeclidin-Vilant (TRELEGY ELLIPTA) 100-62.5-25 MCG/ACT AEPB Inhale 1 puff into the lungs daily. 05/19/22   Coralyn Helling, MD  furosemide (LASIX) 20 MG tablet 1 tab po twice a day as needed for LE  swelling 03/03/22   McGowen, Maryjean Morn, MD  levothyroxine (SYNTHROID) 100 MCG tablet 1/2 tab on 4 days a week and continue whole tab all other day 05/09/22   McGowen, Maryjean Morn, MD  Lifitegrast Benay Spice) 5 % SOLN Place 1 drop into both eyes in the morning and at bedtime.    [provider]  Misc. Devices (PULSE OXIMETER) MISC Check oxygen level as needed 03/12/22   McGowen, Maryjean Morn, MD  ondansetron (ZOFRAN-ODT) 4 MG disintegrating tablet Take 1 tablet (4 mg total) by mouth every 8 (eight) hours as needed for vomiting or nausea. 05/17/22   Mardene Sayer, MD  oxyCODONE (ROXICODONE) 5 MG immediate release tablet Take 1 tablet (5 mg total) by mouth  every 6 (six) hours as needed for up to 5 doses for severe pain. 05/17/22   Mardene Sayer, MD  pantoprazole (PROTONIX) 40 MG tablet Take 1 tablet (40 mg total) by mouth 2 (two) times daily. 05/07/22   McGowen, Maryjean Morn, MD  predniSONE (DELTASONE) 10 MG tablet 2 tabs p.o. daily x 5 days then 1 tab p.o. daily x 5 days 05/16/22   Jeoffrey Massed, MD  roflumilast (DALIRESP) 500 MCG TABS tablet Take 1 tablet (500 mcg total) by mouth daily. 05/31/20   Glenford Bayley, NP  spironolactone (ALDACTONE) 25 MG tablet TAKE 1 AND 1/2 TABLETS BY MOUTH TWICE DAILY 04/30/22   McGowen, Maryjean Morn, MD  triamcinolone cream (KENALOG) 0.1 % Apply 1 application topically 2 (two) times daily. 08/17/20   Kuneff, Renee A, DO  venlafaxine (EFFEXOR) 50 MG tablet Take 1 tablet (50 mg total) by mouth 2 (two) times daily. 04/23/22   McGowen, Maryjean Morn, MD  Vibegron (GEMTESA) 75 MG TABS Take 1 capsule by mouth daily. 11/23/20   McKenzie, Mardene Celeste, MD  vitamin B-12 (CYANOCOBALAMIN) 100 MCG tablet Take 100 mcg by mouth daily.    [provider]  VITAMIN D PO Take 1 tablet by mouth daily.    [provider]      Allergies    Losartan, Penicillins, Cefixime [kdc:cefixime], Gabapentin, Indocin [indomethacin], Singulair [montelukast sodium], Sulfa antibiotics, Ace inhibitors, and Statins    Review of Systems   Review of Systems  Physical Exam Updated Vital Signs BP 131/80   Pulse 60   Temp 97.6 F (36.4 C)   Resp 18   Wt 54.4 kg   SpO2 100%   BMI 22.67 kg/m  Physical Exam Vitals and nursing note reviewed.  Constitutional:      Appearance: Normal appearance.  HENT:     Head: Normocephalic.     Right Ear: External ear normal.     Left Ear: External ear normal.     Nose: Nose normal.     Mouth/Throat:     Pharynx: Oropharynx is clear.  Eyes:     Extraocular Movements: Extraocular movements intact.     Pupils: Pupils are equal, round, and reactive to light.  Cardiovascular:     Rate and Rhythm:  Normal rate and regular rhythm.  Pulmonary:     Effort: Pulmonary effort is normal.     Breath sounds: Normal breath sounds.  Abdominal:     General: Abdomen is flat.     Palpations: Abdomen is soft.  Musculoskeletal:        General: Tenderness and deformity present.     Cervical back: Normal range of motion.     Comments: Right lower extremity with obvious deformity and shortening from left Mild contusion  right hip. Sensation and foot pulses intact  Skin:    General: Skin is warm.     Capillary Refill: Capillary refill takes less than 2 seconds.     Findings: Bruising present.  Neurological:     General: No focal deficit present.     Mental Status: She is alert.  Psychiatric:        Mood and Affect: Mood normal.     ED Results / Procedures / Treatments   Labs (all labs ordered are listed, but only abnormal results are displayed) Labs Reviewed - No data to display  EKG None  Radiology DG Hip Unilat  With Pelvis 2-3 Views Right  Result Date: 05/29/2022 CLINICAL DATA:  Fall, pain, deformity EXAM: DG HIP (WITH OR WITHOUT PELVIS) 2-3V RIGHT COMPARISON:  05/17/2022 FINDINGS: Superior dislocation of right hip total arthroplasty. No evidence of perihardware fracture or other displaced fracture of the hip or pelvis. Status post left hip total arthroplasty, which is normally situated. Nonobstructive pattern of overlying bowel gas. IMPRESSION: Superior dislocation of right hip total arthroplasty. No evidence of perihardware fracture or other displaced fracture of the hip or pelvis. Electronically Signed   By: Jearld Lesch M.D.   On: 05/29/2022 09:58    Procedures .Ortho Injury Treatment  Date/Time: 05/29/2022 11:15 AM  Performed by: Margarita Grizzle, MD Authorized by: Margarita Grizzle, MD   Consent:    Consent obtained:  Written   Consent given by:  Patient   Risks discussed:  Fracture, nerve damage, restricted joint movement, vascular damage, irreducible dislocation, recurrent  dislocation and stiffness   Alternatives discussed:  No treatment Universal protocol:    Patient identity confirmed:  Verbally with patient and arm bandInjury location: hip Location details: right hip Injury type: dislocation Spontaneous dislocation: no Prosthesis: yes Pre-procedure neurovascular assessment: neurovascularly intact Pre-procedure distal perfusion: normal Pre-procedure neurological function: normal Pre-procedure range of motion: reduced  Anesthesia: Local anesthesia used: no  Patient sedated: Yes. Refer to sedation procedure documentation for details of sedation. Manipulation performed: yes Reduction method: external rotation Reduction successful: yes X-Gurnoor Ursua confirmed reduction: yes Immobilization: Knee immobilizer. Splint Applied by: ED Provider Post-procedure neurovascular assessment: post-procedure neurovascularly intact   .Sedation  Date/Time: 05/29/2022 11:53 AM  Performed by: Margarita Grizzle, MD Authorized by: Margarita Grizzle, MD   Consent:    Consent obtained:  Verbal and written   Consent given by:  Patient   Risks discussed:  Prolonged hypoxia resulting in organ damage and allergic reaction   Alternatives discussed:  Analgesia without sedation Universal protocol:    Immediately prior to procedure, a time out was called: yes     Patient identity confirmed:  Arm band and verbally with patient Indications:    Procedure performed:  Dislocation reduction   Procedure necessitating sedation performed by:  Physician performing sedation Pre-sedation assessment:    Time since last food or drink:  2   NPO status caution: urgency dictates proceeding with non-ideal NPO status     ASA classification: class 4 - patient with severe systemic disease that is a constant threat to life     Mouth opening:  3 or more finger widths   Mallampati score:  III - soft palate, base of uvula visible   Neck mobility: reduced     Pre-sedation assessments completed and reviewed:  airway patency, cardiovascular function, mental status and nausea/vomiting     Pre-sedation assessment completed:  05/29/2022 11:55 AM Immediate pre-procedure details:    Reassessment: Patient reassessed immediately prior to procedure  Reviewed: vital signs and NPO status     Verified: bag valve mask available, emergency equipment available, intubation equipment available, IV patency confirmed and oxygen available   Procedure details (see MAR for exact dosages):    Preoxygenation:  Nasal cannula   Sedation:  Propofol   Analgesia:  None   Intra-procedure monitoring:  Blood pressure monitoring, continuous capnometry, frequent LOC assessments, frequent vital sign checks, continuous pulse oximetry and cardiac monitor   Intra-procedure events: none     Total Provider sedation time (minutes):  60 Post-procedure details:    Post-sedation assessment completed:  05/29/2022 11:55 AM   Attendance: Constant attendance by certified staff until patient recovered     Post-sedation assessments completed and reviewed: airway patency, cardiovascular function and mental status     Post-sedation assessments completed and reviewed: post-procedure nausea and vomiting status not reviewed     Patient is stable for discharge or admission: yes     Procedure completion:  Tolerated well, no immediate complications     Medications Ordered in ED Medications  propofol (DIPRIVAN) 10 mg/mL bolus/IV push 26.3 mg (26.3 mg Intravenous Not Given 05/29/22 1112)  propofol (DIPRIVAN) 10 mg/mL bolus/IV push (25 mg Intravenous Given 05/29/22 1105)    ED Course/ Medical Decision Making/ A&P Clinical Course as of 05/29/22 1200  Thu May 29, 2022  1151 Postreduction x-Nishika Parkhurst reviewed interpreted no evidence of acute abnormality noted with good joint reduction [DR]  1151 Right hip x-August Gosser reviewed interpreted prior to procedure.  A superior hip dislocation with no evidence of acute fracture noted on my interpretation and radiologist  interpretation concurs [DR]    Clinical Course User Index [DR] Margarita Grizzle, MD                             Medical Decision Making Amount and/or Complexity of Data Reviewed Radiology: ordered.   87 year old female evaluated here in the emergency department for prior fall with probable hip dislocation.  On my evaluation, differential diagnosis includes syncope versus mechanical fall, full trauma evaluation including head and bleeding and cervical spine injury. Right hip with obvious deformity.  Differential diagnosis includes fracture dislocation, dislocation, other orthopedic injuries. X-Melisia Leming obtained that shows.  Dislocation Patient was sedated and hip dislocation reduced with good results.  Patient has decreased pain.  Post reduction x-Riyana Biel shows good location.  Patient remains neurovascularly intact. Knee immobilizer placed Discussed need for follow-up with the patient Discussed hip dislocation precautions including continuing the hip immobilizer. Patient is to call Dr. Constance Goltz today for recheck.        Final Clinical Impression(s) / ED Diagnoses Final diagnoses:  Hip dislocation, right, initial encounter    Rx / DC Orders ED Discharge Orders     None         Margarita Grizzle, MD 05/29/22 1200

## 2022-06-03 ENCOUNTER — Encounter: Payer: Self-pay | Admitting: Pulmonary Disease

## 2022-06-03 ENCOUNTER — Ambulatory Visit: Payer: PRIVATE HEALTH INSURANCE | Admitting: Pulmonary Disease

## 2022-06-03 VITALS — BP 113/69 | HR 84 | Ht 60.0 in | Wt 114.4 lb

## 2022-06-03 DIAGNOSIS — J9611 Chronic respiratory failure with hypoxia: Secondary | ICD-10-CM

## 2022-06-03 DIAGNOSIS — J439 Emphysema, unspecified: Secondary | ICD-10-CM | POA: Diagnosis not present

## 2022-06-03 DIAGNOSIS — J4489 Other specified chronic obstructive pulmonary disease: Secondary | ICD-10-CM

## 2022-06-03 NOTE — Progress Notes (Signed)
te  [place] [facility]  Chief Complaint  Patient presents with   Follow-up    Pt f/u she is currently dealing w/ a hip displacement. Her O2 sats are in upper 80's on 3-4L POC.     Constitutional:  BP 113/69   Pulse 84   Ht 5' (1.524 m)   Wt 114 lb 6.4 oz (51.9 kg)   SpO2 90% Comment: 4L POC  BMI 22.34 kg/m   Past Medical History:  Chronic cystitis, DDD, Anxiety, Depression, C diff 2017 and 2018, Diverticulosis, GERD, HTN, Hypothyroidism, PNA, Shingles, Spinal stenosis  Past Surgical History:  She  has a past surgical history that includes Cholecystectomy (2001); Tubal ligation (1974); Abdominal hysterectomy (1978); Breast cyst excision; Appendectomy; Tonsillectomy; Colonoscopy (04/2005; 12/06/2015); transthoracic echocardiogram (08/2014); Total hip arthroplasty (Right, 11/01/2014); Hip Closed Reduction (Right, 12/25/2015); DEXA (05/18/2017; 08/7019); Total hip arthroplasty (Left, 12/08/2017); and NCV/EMS (10/2018).  Brief Summary:  Priscilla Cantrell is a 87 y.o. female former smoker with GOLD 4 COPD, ACE cough, and nocturnal hypoxemia.       Subjective:   She is here with her fiance.  She fell a couple of times since her last visit.  Had to get right hip dislocation reduced.  Has a brace on her right leg.    She was treated with prednisone earlier this month for COPD exacerbation.    She needs to use 4 liters pulsed oxygen when using POC.  Can use 2 liters continuous flow at home.  She doesn't have much of an appetite.  She has lost about 10 lbs since November 2023.    She gets winded and fatigued more easily.  It is getting harder for her to keep up with activities.  Not having cough or wheeze.    CXR from 05/10/22 showed changes of emphysema.  Physical Exam:   Appearance - wearing oxygen  ENMT - no sinus tenderness, no oral exudate, no LAN, Mallampati 2 airway, no stridor  Respiratory - decreased breath sounds, no wheeze  CV - s1s2  regular rate and rhythm, no murmurs  Ext - Rt leg in a brace, no clubbing, no edema  Skin - no rashes  Psych - normal mood and affect        Pulmonary testing:  Spirometry 01/10/09 >> FEV1 0.97(52%), FEV1% 47 March 2012 >> Daliresp started RAST 05/04/17 >> negative, IgE 8 Spirometry 05/14/17 >> FEV1 0.91 (55%), FEV1% 48 PFT 10/29/18 >> FEV1 1.05 (68%), FEV1% 52, TLC 4.29 (96%), DLCO 44%  Sleep Tests:  PSG 11/14/11 >> AHI 0.3  ONO with RA 07/31/16 >> test time 6 hrs 41 min.  Average SpO2 87%, low SpO2 74%.  Spent 4 hrs 13 min with SpO2 < 88%. ONO with 2 liters 07/24/21 >> test time 3 hrs 58 min.  Baseline SpO2 97%, low SpO2 84%.  Spent 19 sec with SpO2 < 88%.  Cardiac Tests:  Echo 06/21/21 >> EF 60 to 65%, grade 1 DD, mild elevation in PASP, mild LA dilation  Social History:  She  reports that she quit smoking about 32 years ago. Her smoking use included cigarettes. She started smoking about 66 years ago. She has a 51.00 pack-year smoking history. She has never used smokeless tobacco. She reports current alcohol use. She reports that she does not use drugs.  Family History:  Her family history includes Cancer in her maternal aunt and paternal grandfather; Cirrhosis in her father; Diabetes in her daughter; Goiter in her mother;  Hernia in her paternal grandmother; Hypertension in her paternal grandmother; Psoriasis in her father; Stroke in her maternal grandfather and paternal grandmother.     Discussion:  She had COVID in September 2023 and then RSV and Influenza subsequent to this.  This has taken it's toll on her, and she is needing more oxygen.  She is also having more trouble with memory and confusion.  Her appetite is poor and she has lost weight.  I am worried she will become more sedentary after recent hip injuries.    Assessment/Plan:   GOLD 4 COPD with chronic bronchitis. - continue trelegy 100 one puff daily, daliresp 500 mg daily - prn albuterol - she has a nebulizer -  will arrange for flutter valve and have her use mucinex to help with expectoration - if her symptoms progress, then might need to consider keeping her on a low dose of prednisone chronically  Long COVID. - from September 2023 - symptoms of fatigue, dyspnea and brain fog persist  Allergic rhinitis with upper airway cough. - prn zyrtec, azelastine, flonase  Laryngopharyngeal reflux. - she is to follow up with GI   Chronic respiratory failure with hypoxia. - goal SpO2 > 90% - using 2 liters at rest and 4 liters with exertion since she had Influenza  Time Spent Involved in Patient Care on Day of Examination:  36 minutes  Follow up:   Patient Instructions  Follow up in 2 months  Medication List:   Allergies as of 06/03/2022       Reactions   Losartan Other (See Comments)   hyperkalemia   Penicillins Hives, Swelling, Rash   Has patient had a PCN reaction causing immediate rash, facial/tongue/throat swelling, SOB or lightheadedness with hypotension: YES Has patient had a PCN reaction causing severe rash involving mucus membranes or skin necrosis: NO Has patient had a PCN reaction that required hospitalization NO Has patient had a PCN reaction occurring within the last 10 years: NO If all of the above answers are "NO", then may proceed with Cephalosporin use.   Cefixime [kdc:cefixime] Other (See Comments)   unspecified   Gabapentin Other (See Comments)   Feels woozy   Indocin [indomethacin] Other (See Comments)   Painful tongue and throat   Singulair [montelukast Sodium]    Chest tightness, decreased mental clarity   Sulfa Antibiotics Other (See Comments)   unspecified   Ace Inhibitors Other (See Comments)   cough   Statins Other (See Comments)   Myalgias        Medication List        Accurate as of June 03, 2022 12:43 PM. If you have any questions, ask your nurse or doctor.          STOP taking these medications    predniSONE 10 MG tablet Commonly known  as: DELTASONE Stopped by: Coralyn Helling, MD       TAKE these medications    albuterol (2.5 MG/3ML) 0.083% nebulizer solution Commonly known as: PROVENTIL Take 3 mLs (2.5 mg total) by nebulization every 6 (six) hours as needed for wheezing or shortness of breath.   albuterol 108 (90 Base) MCG/ACT inhaler Commonly known as: ProAir HFA Inhale 2 puffs into the lungs every 4 (four) hours as needed for wheezing or shortness of breath.   alendronate 70 MG tablet Commonly known as: FOSAMAX TAKE 1 TABLET(70 MG) BY MOUTH 1 TIME A WEEK WITH A FULL GLASS OF WATER AND ON AN EMPTY STOMACH   aspirin EC 81  MG tablet Take 81 mg by mouth daily.   azelastine 0.1 % nasal spray Commonly known as: ASTELIN Place 2 sprays into both nostrils 2 (two) times daily. Use in each nostril as directed   cetirizine 10 MG tablet Commonly known as: ZYRTEC Take 1 tablet (10 mg total) by mouth daily. What changed:  when to take this reasons to take this   ciprofloxacin 0.3 % ophthalmic solution Commonly known as: Ciloxan Place 1 drop into the left eye every 4 (four) hours while awake. Administer 1 drop, every 2 hours, while awake, for 2 days. Then 1 drop, every 4 hours, while awake, for the next 5 days.   clobetasol 0.05 % external solution Commonly known as: TEMOVATE APPLY TO THE AFFECTED AREA EVERY DAY   diazepam 5 MG tablet Commonly known as: VALIUM TAKE 1 TABLET BY MOUTH DAILY AS NEEDED FOR ANXIETY OR INSOMNIA What changed:  how much to take how to take this when to take this reasons to take this   fluticasone 0.05 % cream Commonly known as: CUTIVATE Apply topically 2 (two) times daily.   fluticasone 50 MCG/ACT nasal spray Commonly known as: FLONASE Place 2 sprays into both nostrils daily.   furosemide 20 MG tablet Commonly known as: LASIX 1 tab po twice a day as needed for LE swelling   Gemtesa 75 MG Tabs Generic drug: Vibegron Take 1 capsule by mouth daily.   levothyroxine 100 MCG  tablet Commonly known as: SYNTHROID 1/2 tab on 4 days a week and continue whole tab all other day   ondansetron 4 MG disintegrating tablet Commonly known as: ZOFRAN-ODT Take 1 tablet (4 mg total) by mouth every 8 (eight) hours as needed for vomiting or nausea.   oxyCODONE 5 MG immediate release tablet Commonly known as: Roxicodone Take 1 tablet (5 mg total) by mouth every 6 (six) hours as needed for up to 5 doses for severe pain.   pantoprazole 40 MG tablet Commonly known as: PROTONIX Take 1 tablet (40 mg total) by mouth 2 (two) times daily.   Pulse Oximeter Misc Check oxygen level as needed   RESTASIS OP Place 1 drop into both eyes daily as needed (dry eyes).   roflumilast 500 MCG Tabs tablet Commonly known as: DALIRESP Take 1 tablet (500 mcg total) by mouth daily.   Soothe XP Soln Place 1 drop into both eyes 2 (two) times daily as needed (irritation).   spironolactone 25 MG tablet Commonly known as: ALDACTONE TAKE 1 AND 1/2 TABLETS BY MOUTH TWICE DAILY   Trelegy Ellipta 100-62.5-25 MCG/ACT Aepb Generic drug: Fluticasone-Umeclidin-Vilant Inhale 1 puff into the lungs daily.   triamcinolone cream 0.1 % Commonly known as: KENALOG Apply 1 application topically 2 (two) times daily.   venlafaxine 50 MG tablet Commonly known as: EFFEXOR Take 1 tablet (50 mg total) by mouth 2 (two) times daily.   vitamin B-12 100 MCG tablet Commonly known as: CYANOCOBALAMIN Take 100 mcg by mouth daily.   VITAMIN D PO Take 1 tablet by mouth daily.   Xiidra 5 % Soln Generic drug: Lifitegrast Place 1 drop into both eyes in the morning and at bedtime.        Signature:  Coralyn Helling, MD [facility] Pulmonary/Critical Care Pager - [phone] 06/03/2022, 12:43 PM

## 2022-06-03 NOTE — Patient Instructions (Signed)
Follow up in 2 months

## 2022-06-04 ENCOUNTER — Other Ambulatory Visit: Payer: Self-pay | Admitting: Family Medicine

## 2022-06-09 DIAGNOSIS — Z96641 Presence of right artificial hip joint: Secondary | ICD-10-CM | POA: Diagnosis not present

## 2022-06-09 DIAGNOSIS — M25551 Pain in right hip: Secondary | ICD-10-CM | POA: Diagnosis not present

## 2022-06-12 DIAGNOSIS — R0902 Hypoxemia: Secondary | ICD-10-CM | POA: Diagnosis not present

## 2022-06-12 DIAGNOSIS — J984 Other disorders of lung: Secondary | ICD-10-CM | POA: Diagnosis not present

## 2022-06-12 DIAGNOSIS — Z96641 Presence of right artificial hip joint: Secondary | ICD-10-CM | POA: Diagnosis not present

## 2022-06-12 DIAGNOSIS — J449 Chronic obstructive pulmonary disease, unspecified: Secondary | ICD-10-CM | POA: Diagnosis not present

## 2022-06-16 ENCOUNTER — Telehealth: Payer: Self-pay | Admitting: Family Medicine

## 2022-06-16 NOTE — Telephone Encounter (Signed)
Priscilla Cantrell is needing a referral for her memory loss to a neurologist. I see one was placed however she has not been able to get in contact with them and they havent been able to reach her.  Please give Priscilla Cantrell a call to let her know where she was referred to. She is very confused when it comes to the referral.

## 2022-06-16 NOTE — Telephone Encounter (Signed)
LMV for pt regarding referral, see address info below  Dr.Donika [facility] [address] [place] [address]  [place] [address] [phone]

## 2022-06-19 ENCOUNTER — Telehealth: Payer: Self-pay | Admitting: Pharmacy Technician

## 2022-06-19 DIAGNOSIS — Z5986 Financial insecurity: Secondary | ICD-10-CM

## 2022-06-19 NOTE — Progress Notes (Signed)
[facility])                                            [facility] Quality Pharmacy Team    06/19/2022  LILEAH STEIDINGER [birth year] [phone]  Received both patient and provider portion(s) of patient assistance application(s) for Xiidra and Trelegy. Faxed completed application and required documents into [employer] and [employer] respectively.  Shawntrice Salle P. Deborh Pense, CPhT [facility] [employer]  [phone]

## 2022-06-20 ENCOUNTER — Encounter: Payer: Self-pay | Admitting: Family Medicine

## 2022-06-20 ENCOUNTER — Ambulatory Visit (INDEPENDENT_AMBULATORY_CARE_PROVIDER_SITE_OTHER): Payer: Medicare PPO | Admitting: Family Medicine

## 2022-06-20 VITALS — BP 117/69 | HR 80 | Wt 113.4 lb

## 2022-06-20 DIAGNOSIS — Z96649 Presence of unspecified artificial hip joint: Secondary | ICD-10-CM | POA: Diagnosis not present

## 2022-06-20 DIAGNOSIS — R2681 Unsteadiness on feet: Secondary | ICD-10-CM | POA: Diagnosis not present

## 2022-06-20 DIAGNOSIS — R5381 Other malaise: Secondary | ICD-10-CM | POA: Diagnosis not present

## 2022-06-20 DIAGNOSIS — T84029D Dislocation of unspecified internal joint prosthesis, subsequent encounter: Secondary | ICD-10-CM

## 2022-06-20 DIAGNOSIS — R490 Dysphonia: Secondary | ICD-10-CM | POA: Diagnosis not present

## 2022-06-20 DIAGNOSIS — E039 Hypothyroidism, unspecified: Secondary | ICD-10-CM | POA: Diagnosis not present

## 2022-06-20 DIAGNOSIS — R296 Repeated falls: Secondary | ICD-10-CM | POA: Diagnosis not present

## 2022-06-20 DIAGNOSIS — T84020D Dislocation of internal right hip prosthesis, subsequent encounter: Secondary | ICD-10-CM

## 2022-06-20 NOTE — Progress Notes (Signed)
OFFICE VISIT  06/20/2022  CC:  Chief Complaint  Patient presents with   Acute Visit    Voice cracking.    Patient is a 87 y.o. female who presents accompanied by her boyfriend Fayrene Fearing for voice concerns. I last saw her 05/28/2022. A/P as of that visit: "1 chronic hypoxic respiratory failure, COPD, now stable. Continue 2 L oxygen 24/7 or at least wear when active in the daytime and overnight. Continue Trelegy Ellipta, Daliresp, and albuterol as needed. She has follow-up with Dr. Craige Cotta soon.   #2 status post right hip dislocation.  Successful reduction in the emergency department. Doing well.   #3 hypothyroidism.  Most recent TSH 3 weeks ago was quite low. I recommended at that time that she decrease levothyroxine to 1/2 tab on 4 days a week and continue whole tab all other days  Needs TSH recheck approx 5/15.  #4 mild cognitive impairment with memory loss. Neurology referral ordered today."  HPI: On 05/29/2022 she fell on her right side again (tripped getting out of shower) and right hip dislocated again.  She had it successfully reduced in the emergency department.  She is concerned about voice weakness and hoarseness over the last several weeks. No sore throat.  No significant coughing of late.  No significant wheezing lately. No shortness of breath at rest.  No change in her baseline level of dyspnea on exertion, no recent change in O2 requirement.  ROS as above, plus--> no fevers, no CP,  no dizziness, no HAs, no rashes, no melena/hematochezia.  No polyuria or polydipsia.  No myalgias or arthralgias.  No focal weakness, paresthesias, or tremors.    Past Medical History:  Diagnosis Date   Allergic rhinitis    with upper airway cough: as of 07/2017 pulm f/u she was instructed to use astelin, flonase, and saline more consistently   BPPV (benign paroxysmal positional vertigo) 03/2018   C. difficile colitis    Chest pain, non-cardiac    Cardiac CT showed no signif obstructive dz,  mildly elevated calcium level (Dr. Jens Som).   Chronic cystitis with hematuria    Dr. Lenon Curt   Chronic hypoxemic respiratory failure (HCC) 02/02/2015   2L oxygen 24/7   Chronic renal insufficiency, stage 2 (mild)    GFR approx 60   Cold hands    NCS/EMGs normal.  Hand dysesthesias per neuro--reassured   COPD (chronic obstructive pulmonary disease) (HCC)    GOLD 4.  spirometry 01/10/09 FEV 0.97(52%), FEV1% 47.  With chronic bronchitis as of 07/2017.   DDD (degenerative disc disease), cervical    1998 MRI C5-6 impingemt (left).  MRI 2021 (neurol) 1 level canal stenosis, 1 level foraminal stenosis--->PT   DDD (degenerative disc disease), lumbar    Depression with anxiety 04/15/2011   Diarrheal disease Summer 2017   ? 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 Dr Kinnie Scales).  As of 10/05/15 pt reports GI dx'd her with C diff and rx'd more flagyl and she is also on cholestyramine.  As of 02/2016, pt's sx's resolved completely.   Diverticulosis of colon    Procto '97 and colonoscopy 2002   Dysplastic nevus of upper extremity 04/2014   R tricep (Dr. Jorja Loa)   Edema of both lower extremities due to peripheral venous insufficiency    Varicosities bilat, swelling L>R.  R baker's cyst.  Got vein clinic eval 02/2018->sclerotherapy recommended but since no hemorrhage or ulceration, insurance will not cover the procedure.  Family history of adverse reaction to anesthesia    mother died when patient age 29 receiving anesthesia for thyroid surgery   Fibrocystic breast disease    w/fibroadenoma.  Bx's showed NO atypia (Dr. Jamey Ripa).  Mammo neg 2008.   GERD (gastroesophageal reflux disease)    History of double vision    Ophthalmologist, Dr. Elmer Picker, is further evaluating this with MRI  orbits and limited brain (myesthenia gravis testing neg 07/2010)   History of home oxygen therapy    uses 2 liters at hs    Hypertension    EKG 03/2010 normal   Hypothyroidism    Hashimoto's, dx'd 1989   Idiopathic peripheral neuropathy 04/2018   Sensory (numb feet) S1 distribution bilat, c/w spinal stenosis sensory neuropathy (no pain). 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  Labs [place], MRI C spine->some spondylosis/stenosis.  UE NCS/EMS: normal 04/2019.   Lesion of right native kidney 2017   found on CT 02/2015.  F/u MRI 03/31/15 showed it to be smaller and less likely of any significance but repeat CT renal protocol in 28mo was recommended by radiology.  This was done 10/26/15 and showed no interval change in the lesion, but b/c the lesion enhances with contrast, radiol rec'd urol consult (b/c pap renal RCC could not be excluded).  Saw urol 03/06/16--stable on u/s 09/2016 and 09/2017.   OAB (overactive bladder)    Osteoarthritis, hip, bilateral    she is s/p bilat THA as of 02/2018.   Osteoporosis    radius, T score -2.5->rec'd alendronate 08/2019->plan rpt DEXA 08/2021.   Pneumonia    Postmenopausal atrophic vaginitis    improved with estrace 2 X/week.   Prolapse of vaginal cuff after hysterectomy    10/2018: Dr. Katherina Right. Mild colpocele 04/2019 per GYN (no surgery)   Pulmonary nodule    CT chest 12/10, June 2011.  No nodule on CT 06/2010.   Recurrent Clostridium difficile diarrhea    Episode 09/19/2016: resolved with prolonged course of flagyl.  11/2016 recurrence treated with 10d of Dificid (Fidaxomicin) by GI (Dr. Kinnie Scales).   Recurrent UTI    likely related to pt's atrophic vaginitis.  Urol started vaginal estrace 03/2016.   Rib fracture 09/2018   R 7th, laterally-->sustained when her dog pulled her over and she fell.   RLS (restless legs syndrome)    neuro->gabapentin trial 2020/21   Shingles 02/2014   R abd/flank   Spinal stenosis of lumbar region with neurogenic claudication    2008 MRI (L3-4, L4-5, L5-S1).  +S1 distribution sensory loss bilat     Past Surgical History:  Procedure Laterality Date   ABDOMINAL HYSTERECTOMY  1978   no BSO per pt--nonmalignant reasons   APPENDECTOMY     BREAST CYST EXCISION     Last screening mammogram 10/2010 was normal.   CHOLECYSTECTOMY  2001   COLONOSCOPY  04/2005; 12/06/2015   2007 NORMAL.  2017 adenomatous polyp x 1.  Mod sigmoid diverticulosis (Dr. Kinnie Scales).     DEXA  05/18/2017; 08/7019   2019 NORMAL (T-score 0.1).  08/2019->T score -2.5->alendronate started->plan Rpt 08/2021.  03/2022 T score -2.7   HIP CLOSED REDUCTION Right 12/25/2015   Procedure: CLOSED REDUCTION HIP;  Surgeon: Yolonda Kida, MD;  Location: [facility];  Service: Orthopedics;  Laterality: Right;   NCV/EMS  10/2018   chronic and symmetric sensorimotor axonal polyneuropathy affecting the lower extremities   TONSILLECTOMY     TOTAL HIP ARTHROPLASTY Right 11/01/2014   Procedure: RIGHT TOTAL  HIP ARTHROPLASTY;  Surgeon: Ranee Gosselin, MD;  Location: [facility];  Service: Orthopedics;  Laterality: Right;   TOTAL HIP ARTHROPLASTY Left 12/08/2017   Procedure: LEFT TOTAL HIP ARTHROPLASTY ANTERIOR APPROACH;  Surgeon: Durene Romans, MD;  Location: [facility];  Service: Orthopedics;  Laterality: Left;  70 mins   TRANSTHORACIC ECHOCARDIOGRAM  08/2014   2016 EF 60-65%, grade I diast dysfxn. 06/21/21 unchanged.   TUBAL LIGATION  1974    Outpatient Medications Prior to Visit  Medication Sig Dispense Refill   albuterol (PROAIR HFA) 108 (90 Base) MCG/ACT inhaler Inhale 2 puffs into the lungs every 4 (four) hours as needed for wheezing or shortness of breath. 8.5 g 1   albuterol (PROVENTIL) (2.5 MG/3ML) 0.083% nebulizer solution Take 3 mLs (2.5 mg total) by nebulization every 6 (six) hours as needed for wheezing or shortness of breath. 360 mL 5   alendronate (FOSAMAX) 70 MG tablet TAKE 1 TABLET(70 MG) BY MOUTH 1 TIME A WEEK WITH A FULL GLASS OF WATER AND ON AN EMPTY STOMACH 12 tablet 3   Artificial Tear Solution (SOOTHE XP) SOLN Place 1 drop into  both eyes 2 (two) times daily as needed (irritation).     aspirin EC 81 MG tablet Take 81 mg by mouth daily.     ciprofloxacin (CILOXAN) 0.3 % ophthalmic solution Place 1 drop into the left eye every 4 (four) hours while awake. Administer 1 drop, every 2 hours, while awake, for 2 days. Then 1 drop, every 4 hours, while awake, for the next 5 days. 5 mL 0   clobetasol (TEMOVATE) 0.05 % external solution APPLY TO THE AFFECTED AREA EVERY DAY 50 mL 4   cycloSPORINE (RESTASIS OP) Place 1 drop into both eyes daily as needed (dry eyes).     diazepam (VALIUM) 5 MG tablet TAKE 1 TABLET BY MOUTH DAILY AS NEEDED FOR ANXIETY OR INSOMNIA 30 tablet 3   fluticasone (CUTIVATE) 0.05 % cream Apply topically 2 (two) times daily. 30 g 1   fluticasone (FLONASE) 50 MCG/ACT nasal spray Place 2 sprays into both nostrils daily. 16 g 6   Fluticasone-Umeclidin-Vilant (TRELEGY ELLIPTA) 100-62.5-25 MCG/ACT AEPB Inhale 1 puff into the lungs daily. 180 each 3   furosemide (LASIX) 20 MG tablet 1 tab po twice a day as needed for LE swelling 60 tablet 1   levothyroxine (SYNTHROID) 100 MCG tablet 1/2 tab on 4 days a week and continue whole tab all other day 90 tablet 1   Lifitegrast (XIIDRA) 5 % SOLN Place 1 drop into both eyes in the morning and at bedtime.     Misc. Devices (PULSE OXIMETER) MISC Check oxygen level as needed 1 each 1   ondansetron (ZOFRAN-ODT) 4 MG disintegrating tablet Take 1 tablet (4 mg total) by mouth every 8 (eight) hours as needed for vomiting or nausea. 20 tablet 0   oxyCODONE (ROXICODONE) 5 MG immediate release tablet Take 1 tablet (5 mg total) by mouth every 6 (six) hours as needed for up to 5 doses for severe pain. 5 tablet 0   pantoprazole (PROTONIX) 40 MG tablet TAKE 1 TABLET(40 MG) BY MOUTH TWICE DAILY 180 tablet 0   roflumilast (DALIRESP) 500 MCG TABS tablet Take 1 tablet (500 mcg total) by mouth daily. 90 tablet 3   spironolactone (ALDACTONE) 25 MG tablet TAKE 1 AND 1/2 TABLETS BY MOUTH TWICE DAILY 270  tablet 0   triamcinolone cream (KENALOG) 0.1 % Apply 1 application topically 2 (two) times daily. 45 g 2  venlafaxine (EFFEXOR) 50 MG tablet Take 1 tablet (50 mg total) by mouth 2 (two) times daily. 180 tablet 1   Vibegron (GEMTESA) 75 MG TABS Take 1 capsule by mouth daily. 30 tablet 11   vitamin B-12 (CYANOCOBALAMIN) 100 MCG tablet Take 100 mcg by mouth daily.     VITAMIN D PO Take 1 tablet by mouth daily.     azelastine (ASTELIN) 0.1 % nasal spray Place 2 sprays into both nostrils 2 (two) times daily. Use in each nostril as directed (Patient not taking: Reported on 06/03/2022) 30 mL 3   cetirizine (ZYRTEC) 10 MG tablet Take 1 tablet (10 mg total) by mouth daily. (Patient not taking: Reported on 06/20/2022) 30 tablet 6   No facility-administered medications prior to visit.    Allergies  Allergen Reactions   Losartan Other (See Comments)    hyperkalemia   Penicillins Hives, Swelling and Rash    Has patient had a PCN reaction causing immediate rash, facial/tongue/throat swelling, SOB or lightheadedness with hypotension: YES Has patient had a PCN reaction causing severe rash involving mucus membranes or skin necrosis: NO Has patient had a PCN reaction that required hospitalization NO Has patient had a PCN reaction occurring within the last 10 years: NO If all of the above answers are "NO", then may proceed with Cephalosporin use.    Cefixime [Kdc:Cefixime] Other (See Comments)    unspecified   Gabapentin Other (See Comments)    Feels woozy   Indocin [Indomethacin] Other (See Comments)    Painful tongue and throat   Singulair [Montelukast Sodium]     Chest tightness, decreased mental clarity   Sulfa Antibiotics Other (See Comments)    unspecified   Ace Inhibitors Other (See Comments)    cough   Statins Other (See Comments)    Myalgias     Review of Systems  As per HPI  PE:    06/20/2022    2:46 PM 06/03/2022   11:29 AM 05/29/2022   12:14 PM  Vitals with BMI  Height  5\' 0"     Weight 113 lbs 6 oz 114 lbs 6 oz   BMI 22.15 22.34   Systolic 117 113 540  Diastolic 69 69 70  Pulse 80 84 62     Physical Exam  Gen: Alert, well appearing.  Patient is oriented to person, place, time, and situation. JWJ:XBJY: no injection, icteris, swelling, or exudate.  EOMI, PERRLA. Mouth: lips without lesion/swelling.  Oral mucosa pink and moist. Oropharynx without erythema, exudate, or swelling.  Neck: no stridor or wheeze CV: RRR, no m/r/g.   LUNGS: CTA bilat, nonlabored resps, good aeration in all lung fields.   LABS:  Last metabolic panel Lab Results  Component Value Date   GLUCOSE 100 (H) 05/17/2022   NA 142 05/17/2022   K 3.5 05/17/2022   CL 103 05/17/2022   CO2 30 05/17/2022   BUN 15 05/17/2022   CREATININE 0.81 05/17/2022   GFRNONAA >60 05/17/2022   CALCIUM 9.4 05/17/2022   PHOS 3.8 01/31/2013   PROT 6.1 (L) 10/29/2021   ALBUMIN 3.1 (L) 10/29/2021   BILITOT 0.4 10/29/2021   ALKPHOS 58 10/29/2021   AST 18 10/29/2021   ALT 42 10/29/2021   ANIONGAP 9 05/17/2022   Lab Results  Component Value Date   TSH 0.01 Repeated and verified X2. (L) 05/07/2022   IMPRESSION AND PLAN:  1 hoarseness, weakness of voice.  Unknown etiology. Discussed the option of continue observation to see if this resolves spontaneously  over the next few weeks.  She preferred to go ahead and see an ENT MD--referral ordered today.  #2 recurrent right hip prosthesis dislocation. Successful reduction in the emergency department on 05/17/2022 and again on 05/29/2022.  No significant pain since then. She has seen Dr. Charlann Boxer for follow-up and per patient report today he recommended surgery to improve stability of the joint. Due to gradual physical decline over the last year as well as recurrent falls we will refer to physical therapy.  3.  Hypothyroidism.  Most recent TSH about 6 weeks ago was quite low. I recommended at that time that she decrease levothyroxine to 1/2 tab on 4 days a week and  continue whole tab all other days  Repeat TSH today.  #4 mild cognitive impairment with memory loss. Her boyfriend describes increasing sundowning symptoms lately. She is in the process of setting up an appointment with her neurologist, Dr. Allena Katz.  An After Visit Summary was printed and given to the patient.  FOLLOW UP: No follow-ups on file.  Signed:  Santiago Bumpers, MD           06/20/2022

## 2022-06-20 NOTE — Patient Instructions (Signed)
Call Dr. Allena Katz @  [phone] to schedule neurology appointment

## 2022-06-21 LAB — TSH: TSH: 0.01 mIU/L — ABNORMAL LOW (ref 0.40–4.50)

## 2022-06-23 ENCOUNTER — Other Ambulatory Visit: Payer: Self-pay

## 2022-06-23 MED ORDER — LEVOTHYROXINE SODIUM 100 MCG PO TABS: ORAL_TABLET | ORAL | 1 refills | Status: DC

## 2022-06-27 ENCOUNTER — Encounter: Payer: Self-pay | Admitting: Physical Therapy

## 2022-06-27 ENCOUNTER — Ambulatory Visit: Payer: Medicare PPO | Admitting: Physical Therapy

## 2022-06-27 DIAGNOSIS — R2689 Other abnormalities of gait and mobility: Secondary | ICD-10-CM

## 2022-06-27 DIAGNOSIS — M6281 Muscle weakness (generalized): Secondary | ICD-10-CM | POA: Diagnosis not present

## 2022-06-27 NOTE — Therapy (Signed)
OUTPATIENT PHYSICAL THERAPY LOWER EXTREMITY EVALUATION   Patient Name: Priscilla Cantrell MRN: [id number] DOB:08-26-1935, 87 y.o., female Today's Date: 06/27/2022  END OF SESSION:  PT End of Session - 06/27/22 1320     Visit Number 1    Number of Visits 16    Date for PT Re-Evaluation 08/22/22    Authorization Type [employer]    PT Start Time 1145    PT Stop Time 1230    PT Time Calculation (min) 45 min    Equipment Utilized During Treatment Gait belt;Oxygen    Activity Tolerance Patient tolerated treatment well    Behavior During Therapy [facility] for tasks assessed/performed             Past Medical History:  Diagnosis Date   Allergic rhinitis    with upper airway cough: as of 07/2017 pulm f/u she was instructed to use astelin, flonase, and saline more consistently   BPPV (benign paroxysmal positional vertigo) 03/2018   C. difficile colitis    Chest pain, non-cardiac    Cardiac CT showed no signif obstructive dz, mildly elevated calcium level (Dr. Jens Som).   Chronic cystitis with hematuria    Dr. Lenon Curt   Chronic hypoxemic respiratory failure (HCC) 02/02/2015   2L oxygen 24/7   Chronic renal insufficiency, stage 2 (mild)    GFR approx 60   Cold hands    NCS/EMGs normal.  Hand dysesthesias per neuro--reassured   COPD (chronic obstructive pulmonary disease) (HCC)    GOLD 4.  spirometry 01/10/09 FEV 0.97(52%), FEV1% 47.  With chronic bronchitis as of 07/2017.   DDD (degenerative disc disease), cervical    1998 MRI C5-6 impingemt (left).  MRI 2021 (neurol) 1 level canal stenosis, 1 level foraminal stenosis--->PT   DDD (degenerative disc disease), lumbar    Depression with anxiety 04/15/2011   Diarrheal disease Summer 2017   ? 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 Dr Kinnie Scales).  As of 10/05/15 pt reports GI dx'd her with C diff and rx'd more flagyl and she is also on  cholestyramine.  As of 02/2016, pt's sx's resolved completely.   Diverticulosis of colon    Procto '97 and colonoscopy 2002   Dysplastic nevus of upper extremity 04/2014   R tricep (Dr. Jorja Loa)   Edema of both lower extremities due to peripheral venous insufficiency    Varicosities bilat, swelling L>R.  R baker's cyst.  Got vein clinic eval 02/2018->sclerotherapy recommended but since no hemorrhage or ulceration, insurance will not cover the procedure.   Family history of adverse reaction to anesthesia    mother died when patient age 74 receiving anesthesia for thyroid surgery   Fibrocystic breast disease    w/fibroadenoma.  Bx's showed NO atypia (Dr. Jamey Ripa).  Mammo neg 2008.   GERD (gastroesophageal reflux disease)    History of double vision    Ophthalmologist, Dr. Elmer Picker, is further evaluating this with MRI  orbits and limited brain (myesthenia gravis testing neg 07/2010)   History of home oxygen therapy    uses 2 liters at hs   Hypertension    EKG 03/2010 normal   Hypothyroidism    Hashimoto's, dx'd 1989   Idiopathic peripheral neuropathy 04/2018   Sensory (numb feet) S1 distribution bilat, c/w spinal stenosis sensory neuropathy (no pain). 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  Labs [place], MRI C spine->some spondylosis/stenosis.  UE NCS/EMS: normal 04/2019.   Lesion  of right native kidney 2017   found on CT 02/2015.  F/u MRI 03/31/15 showed it to be smaller and less likely of any significance but repeat CT renal protocol in 67mo was recommended by radiology.  This was done 10/26/15 and showed no interval change in the lesion, but b/c the lesion enhances with contrast, radiol rec'd urol consult (b/c pap renal RCC could not be excluded).  Saw urol 03/06/16--stable on u/s 09/2016 and 09/2017.   OAB (overactive bladder)    Osteoarthritis, hip, bilateral    she is s/p bilat THA as of 02/2018.   Osteoporosis    radius, T score  -2.5->rec'd alendronate 08/2019->plan rpt DEXA 08/2021.   Pneumonia    Postmenopausal atrophic vaginitis    improved with estrace 2 X/week.   Prolapse of vaginal cuff after hysterectomy    10/2018: Dr. Katherina Right. Mild colpocele 04/2019 per GYN (no surgery)   Pulmonary nodule    CT chest 12/10, June 2011.  No nodule on CT 06/2010.   Recurrent Clostridium difficile diarrhea    Episode 09/19/2016: resolved with prolonged course of flagyl.  11/2016 recurrence treated with 10d of Dificid (Fidaxomicin) by GI (Dr. Kinnie Scales).   Recurrent UTI    likely related to pt's atrophic vaginitis.  Urol started vaginal estrace 03/2016.   Rib fracture 09/2018   R 7th, laterally-->sustained when her dog pulled her over and she fell.   RLS (restless legs syndrome)    neuro->gabapentin trial 2020/21   Shingles 02/2014   R abd/flank   Spinal stenosis of lumbar region with neurogenic claudication    2008 MRI (L3-4, L4-5, L5-S1).  +S1 distribution sensory loss bilat   Past Surgical History:  Procedure Laterality Date   ABDOMINAL HYSTERECTOMY  1978   no BSO per pt--nonmalignant reasons   APPENDECTOMY     BREAST CYST EXCISION     Last screening mammogram 10/2010 was normal.   CHOLECYSTECTOMY  2001   COLONOSCOPY  04/2005; 12/06/2015   2007 NORMAL.  2017 adenomatous polyp x 1.  Mod sigmoid diverticulosis (Dr. Kinnie Scales).     DEXA  05/18/2017; 08/7019   2019 NORMAL (T-score 0.1).  08/2019->T score -2.5->alendronate started->plan Rpt 08/2021.  03/2022 T score -2.7   HIP CLOSED REDUCTION Right 12/25/2015   Procedure: CLOSED REDUCTION HIP;  Surgeon: Yolonda Kida, MD;  Location: [facility];  Service: Orthopedics;  Laterality: Right;   NCV/EMS  10/2018   chronic and symmetric sensorimotor axonal polyneuropathy affecting the lower extremities   TONSILLECTOMY     TOTAL HIP ARTHROPLASTY Right 11/01/2014   Procedure: RIGHT TOTAL HIP ARTHROPLASTY;  Surgeon: Ranee Gosselin, MD;  Location: [facility];  Service: Orthopedics;  Laterality:  Right;   TOTAL HIP ARTHROPLASTY Left 12/08/2017   Procedure: LEFT TOTAL HIP ARTHROPLASTY ANTERIOR APPROACH;  Surgeon: Durene Romans, MD;  Location: [facility];  Service: Orthopedics;  Laterality: Left;  70 mins   TRANSTHORACIC ECHOCARDIOGRAM  08/2014   2016 EF 60-65%, grade I diast dysfxn. 06/21/21 unchanged.   TUBAL LIGATION  1974   Patient Active Problem List   Diagnosis Date Noted   Sepsis (HCC) 02/22/2022   Acute on chronic respiratory failure with hypoxia (HCC) 02/21/2022   Influenza A 02/21/2022   Shortness of breath 12/08/2019   Swelling of lower extremity 12/08/2019   Stress due to family tension 12/08/2019   Chronic cystitis with hematuria 08/15/2019   Urinary urgency 08/15/2019   Multiple closed fractures of ribs of right side 10/05/2018   Healthcare maintenance 10/05/2018  Medication management 07/29/2018   Overweight (BMI 25.0-29.9) 12/09/2017   S/P left KA 12/08/2017   S/P left THA, AA 12/08/2017   Postherpetic neuralgia 03/07/2015   Constipation 02/28/2015   Renal mass 02/28/2015   Acute pyelonephritis 02/20/2015   Oral thrush 02/08/2015   Chronic hypoxemic respiratory failure (HCC) 02/02/2015   SOB (shortness of breath)    Essential hypertension 01/24/2015   UTI (urinary tract infection) 01/09/2015   Pain and swelling of right lower leg 01/09/2015   H/O total hip arthroplasty 11/01/2014   Spinal stenosis, lumbar region, with neurogenic claudication 01/04/2014   Osteoarthritis, hip, bilateral 07/27/2013   Sciatica associated with disorder of lumbosacral spine 04/19/2013   Atrophic vaginitis 01/12/2012   Periodic limb movement disorder 11/24/2011   Hypoxia 05/22/2011   Depression with anxiety 04/15/2011   Chronic venous insufficiency 07/29/2010   Hypothyroidism due to Hashimoto's thyroiditis 01/07/2010   SPINAL STENOSIS 01/07/2010   Allergic rhinitis 08/09/2007   COPD GOLD III B 08/09/2007    PCP: Nicoletta Ba  REFERRING PROVIDER: Nicoletta Ba  REFERRING DIAG: Unsteady gait, recurrent falls, R hip dislocations   THERAPY DIAG:  Other abnormalities of gait and mobility  Muscle weakness (generalized)  Rationale for Evaluation and Treatment: Rehabilitation  ONSET DATE: 1 month ago  SUBJECTIVE:   SUBJECTIVE STATEMENT: Pt states 2 falls recently, both when coming out of the shower. She dislocated R hip both times.  Had  R hip replacement in 2016. Has also had L THA with 1 dislocation soon after surgery, but has been fine since then.  She states Mild soreness in R hip. She feels unsteady around the house and is afraid to fall again. She does not use AD.  She has portable O2 with her today, on 3 L.  Normally wears 24/7, 2 L at home. Long term partner here with her today to help answer questions.    PERTINENT HISTORY: 2 recent falls with R hip dislocations, chronic respiriatory failure,  chronic renal insufficiency, Copd,  osteoporosis,    PAIN:  Are you having pain? Yes: NPRS scale: 3/10 Pain location: R hip Pain description: sore Aggravating factors: palpation, movement  Relieving factors: none stated   PRECAUTIONS: Other: on O2  (3 L on portable,   2L at home)   WEIGHT BEARING RESTRICTIONS: No  FALLS:  Has patient fallen in last 6 months? Yes. Number of falls 2  LIVING ENVIRONMENT: On 1 floor, small step/lip on walk in shower. Does not have walker.   PLOF: Independent  PATIENT GOALS: no more falls  NEXT MD VISIT:   OBJECTIVE:   DIAGNOSTIC FINDINGS:   PATIENT SURVEYS:    COGNITION: Overall cognitive status: History of cognitive impairments - at baseline/mild     SENSATION:   EDEMA:    POSTURE: No Significant postural limitations  PALPATION: Mild soreness in R gr troch.  Atrophy around R lateral hip.   LOWER EXTREMITY ROM:  L hip: WFL R hip:  gross flexion/ER WFL.     LOWER EXTREMITY MMT:  MMT Right eval Left eval  Hip flexion 4- 4-  Hip extension    Hip abduction    Hip  adduction    Hip internal rotation    Hip external rotation 4- 4-  Knee flexion 4 4  Knee extension 4 4  Ankle dorsiflexion    Ankle plantarflexion    Ankle inversion    Ankle eversion     (Blank rows = not tested)  LOWER EXTREMITY SPECIAL TESTS:  GAIT: Distance walked: 200 Assistive device utilized: None Level of assistance: SBA Comments:    [facility] PT Assessment - 06/27/22 0001       Standardized Balance Assessment   Standardized Balance Assessment Timed Up and Go Test;Berg Balance Test      Berg Balance Test   Sit to Stand Able to stand  independently using hands    Standing Unsupported Able to stand safely 2 minutes    Sitting with Back Unsupported but Feet Supported on Floor or Stool Able to sit safely and securely 2 minutes    Stand to Sit Sits safely with minimal use of hands    Transfers Able to transfer safely, minor use of hands    Standing Unsupported with Eyes Closed Able to stand 10 seconds with supervision    Standing Unsupported with Feet Together Able to place feet together independently but unable to hold for 30 seconds    From Standing, Reach Forward with Outstretched Arm Can reach forward >12 cm safely (5")    From Standing Position, Pick up Object from Floor Able to pick up shoe, needs supervision    From Standing Position, Turn to Look Behind Over each Shoulder Turn sideways only but maintains balance    Turn 360 Degrees Able to turn 360 degrees safely but slowly    Standing Unsupported, Alternately Place Feet on Step/Stool Able to complete 4 steps without aid or supervision    Standing Unsupported, One Foot in Front Needs help to step but can hold 15 seconds    Standing on One Leg Tries to lift leg/unable to hold 3 seconds but remains standing independently    Total Score 38      Timed Up and Go Test   Normal TUG (seconds) 16.5               TODAY'S TREATMENT:                                                                                                                               DATE:    06/27/2022 Ther ex:   Supine: SLR with slight ER x 10 bil;   Hip ER - small ROM, clams GTB x 15;   PATIENT EDUCATION:  Education details: PT POC, Exam findings, HEP, motions to avoid for hip ,  safety with getting out of shower Person educated: Patient, husband  Education method: Explanation, Demonstration, Tactile cues, Verbal cues, and Handouts Education comprehension: verbalized understanding, returned demonstration, verbal cues required, tactile cues required, and needs further education   HOME EXERCISE PROGRAM: Access Code: [facility] URL: [email or web address] Date: 06/27/2022 Prepared by: Sedalia Muta  Exercises - Straight Leg Raise  - 1 x daily - 1-2 sets - 10 reps  ASSESSMENT:  CLINICAL IMPRESSION: Patient presents with primary complaint of 2 recent falls with 2 hip dislocations. She does not have significant balance deficits with short distance walking and does not use AD. She has fallen both  times coming out of shower. Discussed home safety and using grab bars as much as possible when stepping out, and showering in mornings if pt is more alert at that time. She does have strength deficits in bil hips and LE. She has decreased ability and confidence with dynamic balance and stairs. Pt to benefit from skilled PT to improve deficits and safety.   OBJECTIVE IMPAIRMENTS: Abnormal gait, cardiopulmonary status limiting activity, decreased activity tolerance, decreased balance, decreased coordination, decreased endurance, decreased knowledge of use of DME, decreased mobility, difficulty walking, decreased strength, decreased safety awareness, and pain.   ACTIVITY LIMITATIONS: lifting, bending, standing, squatting, stairs, transfers, bathing, hygiene/grooming, and locomotion level  PARTICIPATION LIMITATIONS: cleaning, shopping, and community activity  PERSONAL FACTORS: 1-2 comorbidities: falls and hip dislocations   are  also affecting patient's functional outcome.   REHAB POTENTIAL: Good  CLINICAL DECISION MAKING: Evolving/moderate complexity  EVALUATION COMPLEXITY: Moderate   GOALS: Goals reviewed with patient? Yes  SHORT TERM GOALS: Target date: 07/11/2022   Pt to be independent with initial HEP  Goal status: INITIAL  2.  Pt to demo ability for SLR without hip IR weakness/rotation.  Goal status: INITIAL    LONG TERM GOALS: Target date: 08/22/2022   Pt to be independent with final HEP  Goal status: INITIAL  2.  Pt to demo improved Bil hip strength to max ability, to improve stability and reduce risk for dislocation.   Goal status: INITIAL  3.  Pt to demo ability for safe stair climbing, with use of hand rails as needed, to improve safety with community activity.   Goal status: INITIAL  4.  Pt to demo improved score on BERG by at least 5 points.    Goal status: INITIAL     PLAN:  PT FREQUENCY: 1-2x/week  PT DURATION: 8 weeks  PLANNED INTERVENTIONS: Therapeutic exercises, Therapeutic activity, Neuromuscular re-education, Patient/Family education, Self Care, Joint mobilization, Joint manipulation, Stair training, Orthotic/Fit training, DME instructions, Aquatic Therapy, Dry Needling, Electrical stimulation, Cryotherapy, Moist heat, Taping, Ultrasound, Ionotophoresis 4mg /ml Dexamethasone, Manual therapy,  Vasopneumatic device, Traction, Spinal manipulation, Spinal mobilization,Balance training, Gait training,   PLAN FOR NEXT SESSION: gait, balance, hip strength, safety   Sedalia Muta, PT, DPT 1:35 PM  06/27/22

## 2022-06-29 DIAGNOSIS — Z96641 Presence of right artificial hip joint: Secondary | ICD-10-CM | POA: Diagnosis not present

## 2022-06-29 DIAGNOSIS — J984 Other disorders of lung: Secondary | ICD-10-CM | POA: Diagnosis not present

## 2022-06-29 DIAGNOSIS — R0902 Hypoxemia: Secondary | ICD-10-CM | POA: Diagnosis not present

## 2022-06-29 DIAGNOSIS — J449 Chronic obstructive pulmonary disease, unspecified: Secondary | ICD-10-CM | POA: Diagnosis not present

## 2022-07-01 ENCOUNTER — Telehealth: Payer: Self-pay | Admitting: Pharmacy Technician

## 2022-07-01 DIAGNOSIS — Z5986 Financial insecurity: Secondary | ICD-10-CM

## 2022-07-01 NOTE — Progress Notes (Signed)
[facility] Customer service manager [facility])                                            [facility] Quality Pharmacy Team    07/01/2022  Priscilla Cantrell 1935/08/14 [id number]  Care coordination call placed to [facility] in regard to [facility] application.  Spoke to [place] who informs patient is APPROVED  06/19/22-02/03/23. She informs initial shipment will process automatically and be delivered to the patient's home address on file. She informs patient will need to call [facility] for subsequent refills allowing 30 days for processing and delivery. She informs patient can call [phone] to place the refill re orders.  [name] P. Bransen Fassnacht, CPhT [employer] [employer]  [phone]

## 2022-07-09 ENCOUNTER — Encounter: Payer: Medicare PPO | Admitting: Physical Therapy

## 2022-07-13 DIAGNOSIS — R0902 Hypoxemia: Secondary | ICD-10-CM | POA: Diagnosis not present

## 2022-07-13 DIAGNOSIS — Z96641 Presence of right artificial hip joint: Secondary | ICD-10-CM | POA: Diagnosis not present

## 2022-07-13 DIAGNOSIS — J984 Other disorders of lung: Secondary | ICD-10-CM | POA: Diagnosis not present

## 2022-07-13 DIAGNOSIS — J449 Chronic obstructive pulmonary disease, unspecified: Secondary | ICD-10-CM | POA: Diagnosis not present

## 2022-07-14 ENCOUNTER — Encounter: Payer: Medicare PPO | Admitting: Physical Therapy

## 2022-07-16 ENCOUNTER — Encounter: Payer: Self-pay | Admitting: Physical Therapy

## 2022-07-16 ENCOUNTER — Ambulatory Visit: Payer: Medicare PPO | Admitting: Physical Therapy

## 2022-07-16 DIAGNOSIS — M6281 Muscle weakness (generalized): Secondary | ICD-10-CM

## 2022-07-16 DIAGNOSIS — R2689 Other abnormalities of gait and mobility: Secondary | ICD-10-CM

## 2022-07-16 NOTE — Therapy (Signed)
OUTPATIENT PHYSICAL THERAPY LOWER EXTREMITY TREATMENT    Patient Name: Priscilla Cantrell MRN: [id number] DOB:03/25/1935, 87 y.o., female Today's Date: 07/16/2022  END OF SESSION:  PT End of Session - 07/16/22 1354     Visit Number 2    Number of Visits 16    Date for PT Re-Evaluation 08/22/22    Authorization Type [employer]    PT Start Time 1307    PT Stop Time 1348    PT Time Calculation (min) 41 min    Equipment Utilized During Treatment Gait belt;Oxygen    Activity Tolerance Patient tolerated treatment well    Behavior During Therapy [facility] for tasks assessed/performed              Past Medical History:  Diagnosis Date   Allergic rhinitis    with upper airway cough: as of 07/2017 pulm f/u she was instructed to use astelin, flonase, and saline more consistently   BPPV (benign paroxysmal positional vertigo) 03/2018   C. difficile colitis    Chest pain, non-cardiac    Cardiac CT showed no signif obstructive dz, mildly elevated calcium level (Dr. Jens Som).   Chronic cystitis with hematuria    Dr. Lenon Curt   Chronic hypoxemic respiratory failure (HCC) 02/02/2015   2L oxygen 24/7   Chronic renal insufficiency, stage 2 (mild)    GFR approx 60   Cold hands    NCS/EMGs normal.  Hand dysesthesias per neuro--reassured   COPD (chronic obstructive pulmonary disease) (HCC)    GOLD 4.  spirometry 01/10/09 FEV 0.97(52%), FEV1% 47.  With chronic bronchitis as of 07/2017.   DDD (degenerative disc disease), cervical    1998 MRI C5-6 impingemt (left).  MRI 2021 (neurol) 1 level canal stenosis, 1 level foraminal stenosis--->PT   DDD (degenerative disc disease), lumbar    Depression with anxiety 04/15/2011   Diarrheal disease Summer 2017   ? 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 Dr Kinnie Scales).  As of 10/05/15 pt reports GI dx'd her with C diff and rx'd more flagyl and she is also on  cholestyramine.  As of 02/2016, pt's sx's resolved completely.   Diverticulosis of colon    Procto '97 and colonoscopy 2002   Dysplastic nevus of upper extremity 04/2014   R tricep (Dr. Jorja Loa)   Edema of both lower extremities due to peripheral venous insufficiency    Varicosities bilat, swelling L>R.  R baker's cyst.  Got vein clinic eval 02/2018->sclerotherapy recommended but since no hemorrhage or ulceration, insurance will not cover the procedure.   Family history of adverse reaction to anesthesia    mother died when patient age 89 receiving anesthesia for thyroid surgery   Fibrocystic breast disease    w/fibroadenoma.  Bx's showed NO atypia (Dr. Jamey Ripa).  Mammo neg 2008.   GERD (gastroesophageal reflux disease)    History of double vision    Ophthalmologist, Dr. Elmer Picker, is further evaluating this with MRI  orbits and limited brain (myesthenia gravis testing neg 07/2010)   History of home oxygen therapy    uses 2 liters at hs   Hypertension    EKG 03/2010 normal   Hypothyroidism    Hashimoto's, dx'd 1989   Idiopathic peripheral neuropathy 04/2018   Sensory (numb feet) S1 distribution bilat, c/w spinal stenosis sensory neuropathy (no pain). 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  Labs [place], MRI C spine->some spondylosis/stenosis.  UE NCS/EMS: normal 04/2019.  Lesion of right native kidney 2017   found on CT 02/2015.  F/u MRI 03/31/15 showed it to be smaller and less likely of any significance but repeat CT renal protocol in 64mo was recommended by radiology.  This was done 10/26/15 and showed no interval change in the lesion, but b/c the lesion enhances with contrast, radiol rec'd urol consult (b/c pap renal RCC could not be excluded).  Saw urol 03/06/16--stable on u/s 09/2016 and 09/2017.   OAB (overactive bladder)    Osteoarthritis, hip, bilateral    she is s/p bilat THA as of 02/2018.   Osteoporosis    radius, T score  -2.5->rec'd alendronate 08/2019->plan rpt DEXA 08/2021.   Pneumonia    Postmenopausal atrophic vaginitis    improved with estrace 2 X/week.   Prolapse of vaginal cuff after hysterectomy    10/2018: Dr. Katherina Right. Mild colpocele 04/2019 per GYN (no surgery)   Pulmonary nodule    CT chest 12/10, June 2011.  No nodule on CT 06/2010.   Recurrent Clostridium difficile diarrhea    Episode 09/19/2016: resolved with prolonged course of flagyl.  11/2016 recurrence treated with 10d of Dificid (Fidaxomicin) by GI (Dr. Kinnie Scales).   Recurrent UTI    likely related to pt's atrophic vaginitis.  Urol started vaginal estrace 03/2016.   Rib fracture 09/2018   R 7th, laterally-->sustained when her dog pulled her over and she fell.   RLS (restless legs syndrome)    neuro->gabapentin trial 2020/21   Shingles 02/2014   R abd/flank   Spinal stenosis of lumbar region with neurogenic claudication    2008 MRI (L3-4, L4-5, L5-S1).  +S1 distribution sensory loss bilat   Past Surgical History:  Procedure Laterality Date   ABDOMINAL HYSTERECTOMY  1978   no BSO per pt--nonmalignant reasons   APPENDECTOMY     BREAST CYST EXCISION     Last screening mammogram 10/2010 was normal.   CHOLECYSTECTOMY  2001   COLONOSCOPY  04/2005; 12/06/2015   2007 NORMAL.  2017 adenomatous polyp x 1.  Mod sigmoid diverticulosis (Dr. Kinnie Scales).     DEXA  05/18/2017; 08/7019   2019 NORMAL (T-score 0.1).  08/2019->T score -2.5->alendronate started->plan Rpt 08/2021.  03/2022 T score -2.7   HIP CLOSED REDUCTION Right 12/25/2015   Procedure: CLOSED REDUCTION HIP;  Surgeon: Yolonda Kida, MD;  Location: [facility];  Service: Orthopedics;  Laterality: Right;   NCV/EMS  10/2018   chronic and symmetric sensorimotor axonal polyneuropathy affecting the lower extremities   TONSILLECTOMY     TOTAL HIP ARTHROPLASTY Right 11/01/2014   Procedure: RIGHT TOTAL HIP ARTHROPLASTY;  Surgeon: Ranee Gosselin, MD;  Location: [facility];  Service: Orthopedics;  Laterality:  Right;   TOTAL HIP ARTHROPLASTY Left 12/08/2017   Procedure: LEFT TOTAL HIP ARTHROPLASTY ANTERIOR APPROACH;  Surgeon: Durene Romans, MD;  Location: [facility];  Service: Orthopedics;  Laterality: Left;  70 mins   TRANSTHORACIC ECHOCARDIOGRAM  08/2014   2016 EF 60-65%, grade I diast dysfxn. 06/21/21 unchanged.   TUBAL LIGATION  1974   Patient Active Problem List   Diagnosis Date Noted   Sepsis (HCC) 02/22/2022   Acute on chronic respiratory failure with hypoxia (HCC) 02/21/2022   Influenza A 02/21/2022   Shortness of breath 12/08/2019   Swelling of lower extremity 12/08/2019   Stress due to family tension 12/08/2019   Chronic cystitis with hematuria 08/15/2019   Urinary urgency 08/15/2019   Multiple closed fractures of ribs of right side 10/05/2018   Healthcare maintenance 10/05/2018  Medication management 07/29/2018   Overweight (BMI 25.0-29.9) 12/09/2017   S/P left KA 12/08/2017   S/P left THA, AA 12/08/2017   Postherpetic neuralgia 03/07/2015   Constipation 02/28/2015   Renal mass 02/28/2015   Acute pyelonephritis 02/20/2015   Oral thrush 02/08/2015   Chronic hypoxemic respiratory failure (HCC) 02/02/2015   SOB (shortness of breath)    Essential hypertension 01/24/2015   UTI (urinary tract infection) 01/09/2015   Pain and swelling of right lower leg 01/09/2015   H/O total hip arthroplasty 11/01/2014   Spinal stenosis, lumbar region, with neurogenic claudication 01/04/2014   Osteoarthritis, hip, bilateral 07/27/2013   Sciatica associated with disorder of lumbosacral spine 04/19/2013   Atrophic vaginitis 01/12/2012   Periodic limb movement disorder 11/24/2011   Hypoxia 05/22/2011   Depression with anxiety 04/15/2011   Chronic venous insufficiency 07/29/2010   Hypothyroidism due to Hashimoto's thyroiditis 01/07/2010   SPINAL STENOSIS 01/07/2010   Allergic rhinitis 08/09/2007   COPD GOLD III B 08/09/2007    PCP: Nicoletta Ba  REFERRING PROVIDER: Nicoletta Ba  REFERRING DIAG: Unsteady gait, recurrent falls, R hip dislocations   THERAPY DIAG:  Other abnormalities of gait and mobility  Muscle weakness (generalized)  Rationale for Evaluation and Treatment: Rehabilitation  ONSET DATE: 1 month ago  SUBJECTIVE:   SUBJECTIVE STATEMENT: Pt with husband today. He has had a hard time getting her ready to come to appointments. He states no other falls recently.  She states mild soreness at times in hip .   Eval: Pt states 2 falls recently, both when coming out of the shower. She dislocated R hip both times.  Had  R hip replacement in 2016. Has also had L THA with 1 dislocation soon after surgery, but has been fine since then.  She states Mild soreness in R hip. She feels unsteady around the house and is afraid to fall again. She does not use AD.  She has portable O2 with her today, on 3 L.  Normally wears 24/7, 2 L at home. Long term partner here with her today to help answer questions.    PERTINENT HISTORY: 2 recent falls with R hip dislocations, chronic respiriatory failure,  chronic renal insufficiency, Copd,  osteoporosis,    PAIN:  Are you having pain? Yes: NPRS scale: 3/10 Pain location: R hip Pain description: sore Aggravating factors: palpation, movement  Relieving factors: none stated   PRECAUTIONS: Other: on O2  (3 L on portable,   2L at home)   WEIGHT BEARING RESTRICTIONS: No  FALLS:  Has patient fallen in last 6 months? Yes. Number of falls 2  LIVING ENVIRONMENT: On 1 floor, small step/lip on walk in shower. Does not have walker.   PLOF: Independent  PATIENT GOALS: no more falls  NEXT MD VISIT:   OBJECTIVE:   DIAGNOSTIC FINDINGS:   PATIENT SURVEYS:    COGNITION: Overall cognitive status: History of cognitive impairments - at baseline/mild     SENSATION:   EDEMA:    POSTURE: No Significant postural limitations  PALPATION: Mild soreness in R gr troch.  Atrophy around R lateral hip.   LOWER  EXTREMITY ROM:  L hip: WFL R hip:  gross flexion/ER WFL.     LOWER EXTREMITY MMT:  MMT Right eval Left eval  Hip flexion 4- 4-  Hip extension    Hip abduction    Hip adduction    Hip internal rotation    Hip external rotation 4- 4-  Knee flexion 4 4  Knee  extension 4 4  Ankle dorsiflexion    Ankle plantarflexion    Ankle inversion    Ankle eversion     (Blank rows = not tested)  LOWER EXTREMITY SPECIAL TESTS:    GAIT: Distance walked: 200 Assistive device utilized: None Level of assistance: SBA Comments:       TODAY'S TREATMENT:                                                                                                                              DATE:    07/16/2022 Seated: Hip add Iso squeeze 5 sec x 20;    Hip abd GTB ISO x 20;   sit to stand higher mat table 4 x 5, with education on LE positioning and fwd weight shift.  Supine: SLR 2 x 10 bil (with slight toeing out on R);  Clams GTB/iso/small ROM x 20;  Standing: CGA-   Side stepping length of table x 8,  Marching with counter support x 20;    PATIENT EDUCATION:  Education details: updated and reviewed HEP, reviewed home safety  Person educated: Patient, husband  Education method: Explanation, Demonstration, Tactile cues, Verbal cues, and Handouts Education comprehension: verbalized understanding, returned demonstration, verbal cues required, tactile cues required, and needs further education   HOME EXERCISE PROGRAM: Access Code: [email or web address]   ASSESSMENT:  CLINICAL IMPRESSION: Pt with good ability and tolerance for ther ex today. Requires max cuing for correct performance, alignment and speed. Updated HEP today. Pt with improved ability for sit to stand after education and practice, but requires cuing to keep knees apart. Pt will continue at 1x/wk, due to difficulty of coming to appointments. Plan to progress as tolerated.   Eval: Patient presents with primary complaint of 2 recent falls with 2 hip  dislocations. She does not have significant balance deficits with short distance walking and does not use AD. She has fallen both times coming out of shower. Discussed home safety and using grab bars as much as possible when stepping out, and showering in mornings if pt is more alert at that time. She does have strength deficits in bil hips and LE. She has decreased ability and confidence with dynamic balance and stairs. Pt to benefit from skilled PT to improve deficits and safety.   OBJECTIVE IMPAIRMENTS: Abnormal gait, cardiopulmonary status limiting activity, decreased activity tolerance, decreased balance, decreased coordination, decreased endurance, decreased knowledge of use of DME, decreased mobility, difficulty walking, decreased strength, decreased safety awareness, and pain.   ACTIVITY LIMITATIONS: lifting, bending, standing, squatting, stairs, transfers, bathing, hygiene/grooming, and locomotion level  PARTICIPATION LIMITATIONS: cleaning, shopping, and community activity  PERSONAL FACTORS: 1-2 comorbidities: falls and hip dislocations   are also affecting patient's functional outcome.   REHAB POTENTIAL: Good  CLINICAL DECISION MAKING: Evolving/moderate complexity  EVALUATION COMPLEXITY: Moderate   GOALS: Goals reviewed with patient? Yes  SHORT TERM GOALS: Target date: 07/11/2022   Pt to be independent with initial HEP  Goal status: INITIAL  2.  Pt to demo ability for SLR without hip IR weakness/rotation.  Goal status: INITIAL    LONG TERM GOALS: Target date: 08/22/2022   Pt to be independent with final HEP  Goal status: INITIAL  2.  Pt to demo improved Bil hip strength to max ability, to improve stability and reduce risk for dislocation.   Goal status: INITIAL  3.  Pt to demo ability for safe stair climbing, with use of hand rails as needed, to improve safety with community activity.   Goal status: INITIAL  4.  Pt to demo improved score on BERG by at least 5  points.    Goal status: INITIAL     PLAN:  PT FREQUENCY: 1-2x/week  PT DURATION: 8 weeks  PLANNED INTERVENTIONS: Therapeutic exercises, Therapeutic activity, Neuromuscular re-education, Patient/Family education, Self Care, Joint mobilization, Joint manipulation, Stair training, Orthotic/Fit training, DME instructions, Aquatic Therapy, Dry Needling, Electrical stimulation, Cryotherapy, Moist heat, Taping, Ultrasound, Ionotophoresis 4mg /ml Dexamethasone, Manual therapy,  Vasopneumatic device, Traction, Spinal manipulation, Spinal mobilization,Balance training, Gait training,   PLAN FOR NEXT SESSION: gait, balance, hip strength, safety   Sedalia Muta, PT, DPT 1:54 PM  07/16/22

## 2022-07-21 ENCOUNTER — Telehealth: Payer: Self-pay | Admitting: Family Medicine

## 2022-07-21 MED ORDER — DIAZEPAM 5 MG PO TABS: ORAL_TABLET | ORAL | 3 refills | Status: DC

## 2022-07-21 NOTE — Telephone Encounter (Signed)
Requesting:valium Contract: n/a UDS:n/a Last Visit:06/20/22 Next Visit:07/31/22 Last Refill:01/10/22 (30,3)  Please Advise

## 2022-07-21 NOTE — Telephone Encounter (Signed)
Priscilla Cantrell is needing a refill on her medication for   diazepam (VALIUM) 5 MG tablet   Pharmacy is [facility] in [place]

## 2022-07-21 NOTE — Telephone Encounter (Signed)
OK, valium eRxd 

## 2022-07-22 ENCOUNTER — Encounter: Payer: Self-pay | Admitting: Physical Therapy

## 2022-07-22 ENCOUNTER — Ambulatory Visit: Payer: Medicare PPO | Admitting: Physical Therapy

## 2022-07-22 ENCOUNTER — Ambulatory Visit (INDEPENDENT_AMBULATORY_CARE_PROVIDER_SITE_OTHER): Payer: Medicare PPO | Admitting: Family Medicine

## 2022-07-22 ENCOUNTER — Encounter: Payer: Self-pay | Admitting: Family Medicine

## 2022-07-22 VITALS — BP 110/64 | HR 71 | Temp 98.9°F | Ht 60.0 in | Wt 117.4 lb

## 2022-07-22 DIAGNOSIS — M6281 Muscle weakness (generalized): Secondary | ICD-10-CM | POA: Diagnosis not present

## 2022-07-22 DIAGNOSIS — N390 Urinary tract infection, site not specified: Secondary | ICD-10-CM | POA: Diagnosis not present

## 2022-07-22 DIAGNOSIS — R2689 Other abnormalities of gait and mobility: Secondary | ICD-10-CM | POA: Diagnosis not present

## 2022-07-22 DIAGNOSIS — R300 Dysuria: Secondary | ICD-10-CM

## 2022-07-22 LAB — POCT URINALYSIS DIPSTICK
Bilirubin, UA: NEGATIVE
Glucose, UA: NEGATIVE
Ketones, UA: NEGATIVE
Nitrite, UA: NEGATIVE
Protein, UA: POSITIVE — AB
Spec Grav, UA: 1.02 (ref 1.010–1.025)
Urobilinogen, UA: 0.2 E.U./dL
pH, UA: 6 (ref 5.0–8.0)

## 2022-07-22 MED ORDER — CIPROFLOXACIN HCL 500 MG PO TABS
500.0000 mg | ORAL_TABLET | Freq: Two times a day (BID) | ORAL | 0 refills | Status: AC
Start: 2022-07-22 — End: 2022-07-29

## 2022-07-22 NOTE — Patient Instructions (Addendum)
-  It was a pleasure to meet you today.  -Urinalysis shows urinary tract infection. -START Cipro 500mg , 1 tablet twice a day for 7 days. -Will send urine for culture. Office will call with results. -If symptoms do not improve or become worse, follow up sooner.

## 2022-07-22 NOTE — Therapy (Signed)
OUTPATIENT PHYSICAL THERAPY LOWER EXTREMITY TREATMENT    Patient Name: Priscilla Cantrell MRN: [id number] DOB:08-30-1935, 87 y.o., female Today's Date: 07/22/2022  END OF SESSION:  PT End of Session - 07/22/22 1218     Visit Number 3    Number of Visits 16    Date for PT Re-Evaluation 08/22/22    Authorization Type [employer]    PT Start Time 1219    PT Stop Time 1300    PT Time Calculation (min) 41 min    Equipment Utilized During Treatment Gait belt;Oxygen    Activity Tolerance Patient tolerated treatment well    Behavior During Therapy [facility] for tasks assessed/performed              Past Medical History:  Diagnosis Date   Allergic rhinitis    with upper airway cough: as of 07/2017 pulm f/u she was instructed to use astelin, flonase, and saline more consistently   BPPV (benign paroxysmal positional vertigo) 03/2018   C. difficile colitis    Chest pain, non-cardiac    Cardiac CT showed no signif obstructive dz, mildly elevated calcium level (Dr. Jens Som).   Chronic cystitis with hematuria    Dr. Lenon Curt   Chronic hypoxemic respiratory failure (HCC) 02/02/2015   2L oxygen 24/7   Chronic renal insufficiency, stage 2 (mild)    GFR approx 60   Cold hands    NCS/EMGs normal.  Hand dysesthesias per neuro--reassured   COPD (chronic obstructive pulmonary disease) (HCC)    GOLD 4.  spirometry 01/10/09 FEV 0.97(52%), FEV1% 47.  With chronic bronchitis as of 07/2017.   DDD (degenerative disc disease), cervical    1998 MRI C5-6 impingemt (left).  MRI 2021 (neurol) 1 level canal stenosis, 1 level foraminal stenosis--->PT   DDD (degenerative disc disease), lumbar    Depression with anxiety 04/15/2011   Diarrheal disease Summer 2017   ? 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 Dr Kinnie Scales).  As of 10/05/15 pt reports GI dx'd her with C diff and rx'd more flagyl and she is also on  cholestyramine.  As of 02/2016, pt's sx's resolved completely.   Diverticulosis of colon    Procto '97 and colonoscopy 2002   Dysplastic nevus of upper extremity 04/2014   R tricep (Dr. Jorja Loa)   Edema of both lower extremities due to peripheral venous insufficiency    Varicosities bilat, swelling L>R.  R baker's cyst.  Got vein clinic eval 02/2018->sclerotherapy recommended but since no hemorrhage or ulceration, insurance will not cover the procedure.   Family history of adverse reaction to anesthesia    mother died when patient age 87 receiving anesthesia for thyroid surgery   Fibrocystic breast disease    w/fibroadenoma.  Bx's showed NO atypia (Dr. Jamey Ripa).  Mammo neg 2008.   GERD (gastroesophageal reflux disease)    History of double vision    Ophthalmologist, Dr. Elmer Picker, is further evaluating this with MRI  orbits and limited brain (myesthenia gravis testing neg 07/2010)   History of home oxygen therapy    uses 2 liters at hs   Hypertension    EKG 03/2010 normal   Hypothyroidism    Hashimoto's, dx'd 1989   Idiopathic peripheral neuropathy 04/2018   Sensory (numb feet) S1 distribution bilat, c/w spinal stenosis sensory neuropathy (no pain). 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  Labs Delta, MRI C spine->some spondylosis/stenosis.  UE NCS/EMS: normal 04/2019.  Lesion of right native kidney 2017   found on CT 02/2015.  F/u MRI 03/31/15 showed it to be smaller and less likely of any significance but repeat CT renal protocol in 64mo was recommended by radiology.  This was done 10/26/15 and showed no interval change in the lesion, but b/c the lesion enhances with contrast, radiol rec'd urol consult (b/c pap renal RCC could not be excluded).  Saw urol 03/06/16--stable on u/s 09/2016 and 09/2017.   OAB (overactive bladder)    Osteoarthritis, hip, bilateral    she is s/p bilat THA as of 02/2018.   Osteoporosis    radius, T score  -2.5->rec'd alendronate 08/2019->plan rpt DEXA 08/2021.   Pneumonia    Postmenopausal atrophic vaginitis    improved with estrace 2 X/week.   Prolapse of vaginal cuff after hysterectomy    10/2018: Dr. Katherina Right. Mild colpocele 04/2019 per GYN (no surgery)   Pulmonary nodule    CT chest 12/10, June 2011.  No nodule on CT 06/2010.   Recurrent Clostridium difficile diarrhea    Episode 09/19/2016: resolved with prolonged course of flagyl.  11/2016 recurrence treated with 10d of Dificid (Fidaxomicin) by GI (Dr. Kinnie Scales).   Recurrent UTI    likely related to pt's atrophic vaginitis.  Urol started vaginal estrace 03/2016.   Rib fracture 09/2018   R 7th, laterally-->sustained when her dog pulled her over and she fell.   RLS (restless legs syndrome)    neuro->gabapentin trial 2020/21   Shingles 02/2014   R abd/flank   Spinal stenosis of lumbar region with neurogenic claudication    2008 MRI (L3-4, L4-5, L5-S1).  +S1 distribution sensory loss bilat   Past Surgical History:  Procedure Laterality Date   ABDOMINAL HYSTERECTOMY  1978   no BSO per pt--nonmalignant reasons   APPENDECTOMY     BREAST CYST EXCISION     Last screening mammogram 10/2010 was normal.   CHOLECYSTECTOMY  2001   COLONOSCOPY  04/2005; 12/06/2015   2007 NORMAL.  2017 adenomatous polyp x 1.  Mod sigmoid diverticulosis (Dr. Kinnie Scales).     DEXA  05/18/2017; 08/7019   2019 NORMAL (T-score 0.1).  08/2019->T score -2.5->alendronate started->plan Rpt 08/2021.  03/2022 T score -2.7   HIP CLOSED REDUCTION Right 12/25/2015   Procedure: CLOSED REDUCTION HIP;  Surgeon: Yolonda Kida, MD;  Location: [facility];  Service: Orthopedics;  Laterality: Right;   NCV/EMS  10/2018   chronic and symmetric sensorimotor axonal polyneuropathy affecting the lower extremities   TONSILLECTOMY     TOTAL HIP ARTHROPLASTY Right 11/01/2014   Procedure: RIGHT TOTAL HIP ARTHROPLASTY;  Surgeon: Ranee Gosselin, MD;  Location: [facility];  Service: Orthopedics;  Laterality:  Right;   TOTAL HIP ARTHROPLASTY Left 12/08/2017   Procedure: LEFT TOTAL HIP ARTHROPLASTY ANTERIOR APPROACH;  Surgeon: Durene Romans, MD;  Location: [facility];  Service: Orthopedics;  Laterality: Left;  70 mins   TRANSTHORACIC ECHOCARDIOGRAM  08/2014   2016 EF 60-65%, grade I diast dysfxn. 06/21/21 unchanged.   TUBAL LIGATION  1974   Patient Active Problem List   Diagnosis Date Noted   Sepsis (HCC) 02/22/2022   Acute on chronic respiratory failure with hypoxia (HCC) 02/21/2022   Influenza A 02/21/2022   Shortness of breath 12/08/2019   Swelling of lower extremity 12/08/2019   Stress due to family tension 12/08/2019   Chronic cystitis with hematuria 08/15/2019   Urinary urgency 08/15/2019   Multiple closed fractures of ribs of right side 10/05/2018   Healthcare maintenance 10/05/2018  Medication management 07/29/2018   Overweight (BMI 25.0-29.9) 12/09/2017   S/P left KA 12/08/2017   S/P left THA, AA 12/08/2017   Postherpetic neuralgia 03/07/2015   Constipation 02/28/2015   Renal mass 02/28/2015   Acute pyelonephritis 02/20/2015   Oral thrush 02/08/2015   Chronic hypoxemic respiratory failure (HCC) 02/02/2015   SOB (shortness of breath)    Essential hypertension 01/24/2015   UTI (urinary tract infection) 01/09/2015   Pain and swelling of right lower leg 01/09/2015   H/O total hip arthroplasty 11/01/2014   Spinal stenosis, lumbar region, with neurogenic claudication 01/04/2014   Osteoarthritis, hip, bilateral 07/27/2013   Sciatica associated with disorder of lumbosacral spine 04/19/2013   Atrophic vaginitis 01/12/2012   Periodic limb movement disorder 11/24/2011   Hypoxia 05/22/2011   Depression with anxiety 04/15/2011   Chronic venous insufficiency 07/29/2010   Hypothyroidism due to Hashimoto's thyroiditis 01/07/2010   SPINAL STENOSIS 01/07/2010   Allergic rhinitis 08/09/2007   COPD GOLD III B 08/09/2007    PCP: Nicoletta Ba  REFERRING PROVIDER: Nicoletta Ba  REFERRING DIAG: Unsteady gait, recurrent falls, R hip dislocations   THERAPY DIAG:  Other abnormalities of gait and mobility  Muscle weakness (generalized)  Rationale for Evaluation and Treatment: Rehabilitation  ONSET DATE: 1 month ago  SUBJECTIVE:   SUBJECTIVE STATEMENT: Pt with husband today. Thinks she has a kidney infection, going to see MD later today.   Eval: Pt states 2 falls recently, both when coming out of the shower. She dislocated R hip both times.  Had  R hip replacement in 2016. Has also had L THA with 1 dislocation soon after surgery, but has been fine since then.  She states Mild soreness in R hip. She feels unsteady around the house and is afraid to fall again. She does not use AD.  She has portable O2 with her today, on 3 L.  Normally wears 24/7, 2 L at home. Long term partner here with her today to help answer questions.    PERTINENT HISTORY: 2 recent falls with R hip dislocations, chronic respiriatory failure,  chronic renal insufficiency, Copd,  osteoporosis,    PAIN:  Are you having pain? Yes: NPRS scale: 3/10 Pain location: R hip Pain description: sore Aggravating factors: palpation, movement  Relieving factors: none stated   PRECAUTIONS: Other: on O2  (3 L on portable,   2L at home)   WEIGHT BEARING RESTRICTIONS: No  FALLS:  Has patient fallen in last 6 months? Yes. Number of falls 2  LIVING ENVIRONMENT: On 1 floor, small step/lip on walk in shower. Does not have walker.   PLOF: Independent  PATIENT GOALS: no more falls  NEXT MD VISIT:   OBJECTIVE:   DIAGNOSTIC FINDINGS:   PATIENT SURVEYS:    COGNITION: Overall cognitive status: History of cognitive impairments - at baseline/mild     SENSATION:   EDEMA:    POSTURE: No Significant postural limitations  PALPATION: Mild soreness in R gr troch.  Atrophy around R lateral hip.   LOWER EXTREMITY ROM:  L hip: WFL R hip:  gross flexion/ER WFL.     LOWER EXTREMITY  MMT:  MMT Right eval Left eval  Hip flexion 4- 4-  Hip extension    Hip abduction    Hip adduction    Hip internal rotation    Hip external rotation 4- 4-  Knee flexion 4 4  Knee extension 4 4  Ankle dorsiflexion    Ankle plantarflexion    Ankle inversion  Ankle eversion     (Blank rows = not tested)  LOWER EXTREMITY SPECIAL TESTS:    GAIT: Distance walked: 200 Assistive device utilized: None Level of assistance: SBA Comments:       TODAY'S TREATMENT:                                                                                                                              DATE:    07/22/2022 Seated: Hip add Iso squeeze 5 sec x 20 with practice for upright posture;    Hip abd GTB ISO x 20;   sit to stand higher mat table 4 x 5, with education on LE positioning and fwd weight shift.  Supine: pelvic tilts x 15;   Clams GTB/iso/small ROM x 20;  S/L: clams x 10;  Standing: CGA-   Side stepping length of table x 8,  Marching with counter support x 20;   Previous:  SLR    PATIENT EDUCATION:  Education details: updated and reviewed HEP, reviewed home safety , discussed not getting down on floor for exercises,  and other positions that would be likely to cause dislocations/ to avoid.  Person educated: Patient, husband  Education method: Explanation, Demonstration, Tactile cues, Verbal cues, and Handouts Education comprehension: verbalized understanding, returned demonstration, verbal cues required, tactile cues required, and needs further education   HOME EXERCISE PROGRAM: Access Code: [email or web address]   ASSESSMENT:  CLINICAL IMPRESSION: Pt with slight pain noted in R sacral/glute area on R during exercises. Palpable pain in s/l along R side of sacrum. She declines to have any soft tissue work done to this area due to soreness. Pt with good tolerance for exercises. Requires Max cueing to keep knees apart during transfers, and requires min/mod cuing for other exercises for  form. Plan to practice stairs next visit.   Eval: Patient presents with primary complaint of 2 recent falls with 2 hip dislocations. She does not have significant balance deficits with short distance walking and does not use AD. She has fallen both times coming out of shower. Discussed home safety and using grab bars as much as possible when stepping out, and showering in mornings if pt is more alert at that time. She does have strength deficits in bil hips and LE. She has decreased ability and confidence with dynamic balance and stairs. Pt to benefit from skilled PT to improve deficits and safety.   OBJECTIVE IMPAIRMENTS: Abnormal gait, cardiopulmonary status limiting activity, decreased activity tolerance, decreased balance, decreased coordination, decreased endurance, decreased knowledge of use of DME, decreased mobility, difficulty walking, decreased strength, decreased safety awareness, and pain.   ACTIVITY LIMITATIONS: lifting, bending, standing, squatting, stairs, transfers, bathing, hygiene/grooming, and locomotion level  PARTICIPATION LIMITATIONS: cleaning, shopping, and community activity  PERSONAL FACTORS: 1-2 comorbidities: falls and hip dislocations   are also affecting patient's functional outcome.   REHAB POTENTIAL: Good  CLINICAL DECISION MAKING: Evolving/moderate complexity  EVALUATION COMPLEXITY: Moderate   GOALS: Goals  reviewed with patient? Yes  SHORT TERM GOALS: Target date: 07/11/2022  Pt to be independent with initial HEP  Goal status: INITIAL  2.  Pt to demo ability for SLR without hip IR weakness/rotation.  Goal status: INITIAL    LONG TERM GOALS: Target date: 08/22/2022   Pt to be independent with final HEP  Goal status: INITIAL  2.  Pt to demo improved Bil hip strength to max ability, to improve stability and reduce risk for dislocation.   Goal status: INITIAL  3.  Pt to demo ability for safe stair climbing, with use of hand rails as needed, to  improve safety with community activity.   Goal status: INITIAL  4.  Pt to demo improved score on BERG by at least 5 points.    Goal status: INITIAL     PLAN:  PT FREQUENCY: 1-2x/week  PT DURATION: 8 weeks  PLANNED INTERVENTIONS: Therapeutic exercises, Therapeutic activity, Neuromuscular re-education, Patient/Family education, Self Care, Joint mobilization, Joint manipulation, Stair training, Orthotic/Fit training, DME instructions, Aquatic Therapy, Dry Needling, Electrical stimulation, Cryotherapy, Moist heat, Taping, Ultrasound, Ionotophoresis 4mg /ml Dexamethasone, Manual therapy,  Vasopneumatic device, Traction, Spinal manipulation, Spinal mobilization,Balance training, Gait training,   PLAN FOR NEXT SESSION: stairs, standing exercises, gait, balance, hip strength, safety   Sedalia Muta, PT, DPT 1:43 PM  07/22/22

## 2022-07-22 NOTE — Progress Notes (Signed)
Acute Office Visit   Subjective:  Patient ID: Priscilla Cantrell, female    DOB: [birth year], 87 y.o.   MRN: [id number]  Chief Complaint  Patient presents with   buring urination    [Pt reports sx started 07/22/2022 Pt reports OTC AZO Pt reports snotty urine Pt has had hx of UtI in past    HPI Patient is a 87 year old caucasian female that presents dysuria, increase frequency or urgency. Denies any hematuria, fever, and lower back pain. Lower abd pressure when bending over.   Symptoms started middle of last week.  She has been taking OTC Azo since Saturday.   ROS See HPI above      Objective:   BP 110/64   Pulse 71   Temp 98.9 F (37.2 C)   Ht 5' (1.524 m)   Wt 117 lb 6 oz (53.2 kg)   SpO2 98%   BMI 22.92 kg/m    Physical Exam Vitals reviewed.  Constitutional:      General: She is not in acute distress.    Appearance: Normal appearance. She is not ill-appearing, toxic-appearing or diaphoretic.  HENT:     Head: Normocephalic and atraumatic.  Eyes:     General:        Right eye: No discharge.        Left eye: No discharge.     Conjunctiva/sclera: Conjunctivae normal.  Cardiovascular:     Rate and Rhythm: Normal rate and regular rhythm.     Heart sounds: Normal heart sounds. No murmur heard.    No friction rub. No gallop.  Pulmonary:     Effort: Pulmonary effort is normal. No respiratory distress.     Breath sounds: Normal breath sounds.  Musculoskeletal:        General: Normal range of motion.  Skin:    General: Skin is warm and dry.  Neurological:     General: No focal deficit present.     Mental Status: She is alert and oriented to person, place, and time. Mental status is at baseline.  Psychiatric:        Mood and Affect: Mood normal.        Behavior: Behavior normal.        Thought Content: Thought content normal.        Judgment: Judgment normal.    Results for orders placed or performed in visit on 07/22/22  POCT Urinalysis Dipstick  Result  Value Ref Range   Color, UA YELLOW    Clarity, UA     Glucose, UA Negative Negative   Bilirubin, UA NEG    Ketones, UA NEG    Spec Grav, UA 1.020 1.010 - 1.025   Blood, UA 1+    pH, UA 6.0 5.0 - 8.0   Protein, UA Positive (A) Negative   Urobilinogen, UA 0.2 0.2 or 1.0 E.U./dL   Nitrite, UA NEG    Leukocytes, UA Trace (A) Negative   Appearance     Odor        Assessment & Plan:  Urinary tract infection without hematuria, site unspecified -     Urine Culture -     Ciprofloxacin HCl; Take 1 tablet (500 mg total) by mouth 2 (two) times daily for 7 days.  Dispense: 14 tablet; Refill: 0  Burning with urination -     POCT urinalysis dipstick  1.Urinalysis shows UTI with leukocytes and protein positive. Will send urine for culture.  2.Prescribed Ciprofloxacin 500mg  1 tablet BID  for 7 days.  3.Hydrate. 4.Follow up if not improved or symptoms become worse.   No follow-ups on file.  Zandra Abts, NP

## 2022-07-24 ENCOUNTER — Encounter: Payer: Medicare PPO | Admitting: Physical Therapy

## 2022-07-24 LAB — URINE CULTURE
MICRO NUMBER:: 15096797
SPECIMEN QUALITY:: ADEQUATE

## 2022-07-25 ENCOUNTER — Telehealth: Payer: Self-pay

## 2022-07-25 NOTE — Telephone Encounter (Signed)
-----   Message from Alveria Apley, NP sent at 07/25/2022  8:11 AM EDT ----- Your urine culture came back positive with E. Coli. Based on the culture report, Ciprofloxacin is covered. Take full course of antibiotic. If not improved afterwards, follow up.

## 2022-07-25 NOTE — Telephone Encounter (Signed)
Pt reports she feels a little pressure but not as bad as it was before.  Pt will F/U if she has sx.

## 2022-07-25 NOTE — Telephone Encounter (Signed)
Return patient call when possible, mobile number is best

## 2022-07-25 NOTE — Telephone Encounter (Signed)
Left vm

## 2022-07-28 ENCOUNTER — Other Ambulatory Visit: Payer: Self-pay | Admitting: Family Medicine

## 2022-07-29 ENCOUNTER — Encounter: Payer: Medicare PPO | Admitting: Physical Therapy

## 2022-07-30 DIAGNOSIS — J449 Chronic obstructive pulmonary disease, unspecified: Secondary | ICD-10-CM | POA: Diagnosis not present

## 2022-07-30 DIAGNOSIS — Z96641 Presence of right artificial hip joint: Secondary | ICD-10-CM | POA: Diagnosis not present

## 2022-07-30 DIAGNOSIS — R0902 Hypoxemia: Secondary | ICD-10-CM | POA: Diagnosis not present

## 2022-07-30 DIAGNOSIS — J984 Other disorders of lung: Secondary | ICD-10-CM | POA: Diagnosis not present

## 2022-07-30 NOTE — Patient Instructions (Signed)

## 2022-07-31 ENCOUNTER — Encounter: Payer: Self-pay | Admitting: Physical Therapy

## 2022-07-31 ENCOUNTER — Ambulatory Visit (INDEPENDENT_AMBULATORY_CARE_PROVIDER_SITE_OTHER): Payer: Medicare PPO | Admitting: Family Medicine

## 2022-07-31 ENCOUNTER — Ambulatory Visit: Payer: Medicare PPO | Admitting: Physical Therapy

## 2022-07-31 ENCOUNTER — Encounter: Payer: Self-pay | Admitting: Family Medicine

## 2022-07-31 VITALS — BP 115/75 | HR 73 | Temp 98.0°F | Ht 60.0 in | Wt 115.2 lb

## 2022-07-31 DIAGNOSIS — E039 Hypothyroidism, unspecified: Secondary | ICD-10-CM | POA: Diagnosis not present

## 2022-07-31 DIAGNOSIS — R2689 Other abnormalities of gait and mobility: Secondary | ICD-10-CM | POA: Diagnosis not present

## 2022-07-31 DIAGNOSIS — M6281 Muscle weakness (generalized): Secondary | ICD-10-CM

## 2022-07-31 NOTE — Progress Notes (Signed)
OFFICE VISIT  07/31/2022  CC: No chief complaint on file.   Patient is a 87 y.o. female who presents for follow-up hypothyroidism. A/P as of last visit: "1 hoarseness, weakness of voice.  Unknown etiology. Discussed the option of continue observation to see if this resolves spontaneously over the next few weeks.  She preferred to go ahead and see an ENT MD--referral ordered today.   #2 recurrent right hip prosthesis dislocation. Successful reduction in the emergency department on 05/17/2022 and again on 05/29/2022.  No significant pain since then. She has seen Dr. Charlann Boxer for follow-up and per patient report today he recommended surgery to improve stability of the joint. Due to gradual physical decline over the last year as well as recurrent falls we will refer to physical therapy.   3.  Hypothyroidism.  Most recent TSH about 6 weeks ago was quite low. I recommended at that time that she decrease levothyroxine to 1/2 tab on 4 days a week and continue whole tab all other days  Repeat TSH today.   #4 mild cognitive impairment with memory loss. Her boyfriend describes increasing sundowning symptoms lately. She is in the process of setting up an appointment with her neurologist, Dr. Allena Katz."  INTERIM HX: Feels fine, still wearing 2 L nasal cannula.  Chip Boer had a UTI diagnosed 07/22/2022, urine cultures showed pansensitive E. coli, she was treated with Cipro x 7 days.  Currently without any infection symptoms.  She has been in PT for her recurrent right hip dislocations.  TSH last visit was 0.01.  I advised her to stop levothyroxine for 5 days, then restart at 1/2 of a 100 microgram tab daily.  Past Medical History:  Diagnosis Date   Allergic rhinitis    with upper airway cough: as of 07/2017 pulm f/u she was instructed to use astelin, flonase, and saline more consistently   BPPV (benign paroxysmal positional vertigo) 03/2018   C. difficile colitis    Chest pain, non-cardiac    Cardiac CT  showed no signif obstructive dz, mildly elevated calcium level (Dr. Jens Som).   Chronic cystitis with hematuria    Dr. Lenon Curt   Chronic hypoxemic respiratory failure (HCC) 02/02/2015   2L oxygen 24/7   Chronic renal insufficiency, stage 2 (mild)    GFR approx 60   Cold hands    NCS/EMGs normal.  Hand dysesthesias per neuro--reassured   COPD (chronic obstructive pulmonary disease) (HCC)    GOLD 4.  spirometry 01/10/09 FEV 0.97(52%), FEV1% 47.  With chronic bronchitis as of 07/2017.   DDD (degenerative disc disease), cervical    1998 MRI C5-6 impingemt (left).  MRI 2021 (neurol) 1 level canal stenosis, 1 level foraminal stenosis--->PT   DDD (degenerative disc disease), lumbar    Depression with anxiety 04/15/2011   Diarrheal disease Summer 2017   ? 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 Dr Kinnie Scales).  As of 10/05/15 pt reports GI dx'd her with C diff and rx'd more flagyl and she is also on cholestyramine.  As of 02/2016, pt's sx's resolved completely.   Diverticulosis of colon    Procto '97 and colonoscopy 2002   Dysplastic nevus of upper extremity 04/2014   R tricep (Dr. Jorja Loa)   Edema of both lower extremities due to peripheral venous insufficiency    Varicosities bilat, swelling L>R.  R baker's cyst.  Got vein clinic eval 02/2018->sclerotherapy recommended but since no hemorrhage or ulceration, insurance will not  cover the procedure.   Family history of adverse reaction to anesthesia    mother died when patient age 75 receiving anesthesia for thyroid surgery   Fibrocystic breast disease    w/fibroadenoma.  Bx's showed NO atypia (Dr. Jamey Ripa).  Mammo neg 2008.   GERD (gastroesophageal reflux disease)    History of double vision    Ophthalmologist, Dr. Elmer Picker, is further evaluating this with MRI  orbits and limited brain (myesthenia gravis testing neg 07/2010)   History of home oxygen  therapy    uses 2 liters at hs   Hypertension    EKG 03/2010 normal   Hypothyroidism    Hashimoto's, dx'd 1989   Idiopathic peripheral neuropathy 04/2018   Sensory (numb feet) S1 distribution bilat, c/w spinal stenosis sensory neuropathy (no pain). 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  Labs [place], MRI C spine->some spondylosis/stenosis.  UE NCS/EMS: normal 04/2019.   Lesion of right native kidney 2017   found on CT 02/2015.  F/u MRI 03/31/15 showed it to be smaller and less likely of any significance but repeat CT renal protocol in 40mo was recommended by radiology.  This was done 10/26/15 and showed no interval change in the lesion, but b/c the lesion enhances with contrast, radiol rec'd urol consult (b/c pap renal RCC could not be excluded).  Saw urol 03/06/16--stable on u/s 09/2016 and 09/2017.   OAB (overactive bladder)    Osteoarthritis, hip, bilateral    she is s/p bilat THA as of 02/2018.   Osteoporosis    radius, T score -2.5->rec'd alendronate 08/2019->plan rpt DEXA 08/2021.   Pneumonia    Postmenopausal atrophic vaginitis    improved with estrace 2 X/week.   Prolapse of vaginal cuff after hysterectomy    10/2018: Dr. Katherina Right. Mild colpocele 04/2019 per GYN (no surgery)   Pulmonary nodule    CT chest 12/10, June 2011.  No nodule on CT 06/2010.   Recurrent Clostridium difficile diarrhea    Episode 09/19/2016: resolved with prolonged course of flagyl.  11/2016 recurrence treated with 10d of Dificid (Fidaxomicin) by GI (Dr. Kinnie Scales).   Recurrent UTI    likely related to pt's atrophic vaginitis.  Urol started vaginal estrace 03/2016.   Rib fracture 09/2018   R 7th, laterally-->sustained when her dog pulled her over and she fell.   RLS (restless legs syndrome)    neuro->gabapentin trial 2020/21   Shingles 02/2014   R abd/flank   Spinal stenosis of lumbar region with neurogenic claudication    2008 MRI (L3-4, L4-5, L5-S1).  +S1  distribution sensory loss bilat    Past Surgical History:  Procedure Laterality Date   ABDOMINAL HYSTERECTOMY  1978   no BSO per pt--nonmalignant reasons   APPENDECTOMY     BREAST CYST EXCISION     Last screening mammogram 10/2010 was normal.   CHOLECYSTECTOMY  2001   COLONOSCOPY  04/2005; 12/06/2015   2007 NORMAL.  2017 adenomatous polyp x 1.  Mod sigmoid diverticulosis (Dr. Kinnie Scales).     DEXA  05/18/2017; 08/7019   2019 NORMAL (T-score 0.1).  08/2019->T score -2.5->alendronate started->plan Rpt 08/2021.  03/2022 T score -2.7   HIP CLOSED REDUCTION Right 12/25/2015   Procedure: CLOSED REDUCTION HIP;  Surgeon: Yolonda Kida, MD;  Location: [facility];  Service: Orthopedics;  Laterality: Right;   NCV/EMS  10/2018   chronic and symmetric sensorimotor axonal polyneuropathy affecting the lower extremities   TONSILLECTOMY     TOTAL HIP ARTHROPLASTY Right 11/01/2014  Procedure: RIGHT TOTAL HIP ARTHROPLASTY;  Surgeon: Ranee Gosselin, MD;  Location: [facility];  Service: Orthopedics;  Laterality: Right;   TOTAL HIP ARTHROPLASTY Left 12/08/2017   Procedure: LEFT TOTAL HIP ARTHROPLASTY ANTERIOR APPROACH;  Surgeon: Durene Romans, MD;  Location: [facility];  Service: Orthopedics;  Laterality: Left;  70 mins   TRANSTHORACIC ECHOCARDIOGRAM  08/2014   2016 EF 60-65%, grade I diast dysfxn. 06/21/21 unchanged.   TUBAL LIGATION  1974    Outpatient Medications Prior to Visit  Medication Sig Dispense Refill   albuterol (PROAIR HFA) 108 (90 Base) MCG/ACT inhaler Inhale 2 puffs into the lungs every 4 (four) hours as needed for wheezing or shortness of breath. 8.5 g 1   albuterol (PROVENTIL) (2.5 MG/3ML) 0.083% nebulizer solution Take 3 mLs (2.5 mg total) by nebulization every 6 (six) hours as needed for wheezing or shortness of breath. 360 mL 5   alendronate (FOSAMAX) 70 MG tablet TAKE 1 TABLET(70 MG) BY MOUTH 1 TIME A WEEK WITH A FULL GLASS OF WATER AND ON AN EMPTY STOMACH 12 tablet 3   Artificial Tear Solution  (SOOTHE XP) SOLN Place 1 drop into both eyes 2 (two) times daily as needed (irritation).     aspirin EC 81 MG tablet Take 81 mg by mouth daily.     azelastine (ASTELIN) 0.1 % nasal spray Place 2 sprays into both nostrils 2 (two) times daily. Use in each nostril as directed 30 mL 3   cetirizine (ZYRTEC) 10 MG tablet Take 1 tablet (10 mg total) by mouth daily. 30 tablet 6   ciprofloxacin (CILOXAN) 0.3 % ophthalmic solution Place 1 drop into the left eye every 4 (four) hours while awake. Administer 1 drop, every 2 hours, while awake, for 2 days. Then 1 drop, every 4 hours, while awake, for the next 5 days. 5 mL 0   clobetasol (TEMOVATE) 0.05 % external solution APPLY TO THE AFFECTED AREA EVERY DAY 50 mL 4   cycloSPORINE (RESTASIS OP) Place 1 drop into both eyes daily as needed (dry eyes).     diazepam (VALIUM) 5 MG tablet TAKE 1 TABLET BY MOUTH DAILY AS NEEDED FOR ANXIETY OR INSOMNIA 30 tablet 3   fluticasone (FLONASE) 50 MCG/ACT nasal spray Place 2 sprays into both nostrils daily. 16 g 6   Fluticasone-Umeclidin-Vilant (TRELEGY ELLIPTA) 100-62.5-25 MCG/ACT AEPB Inhale 1 puff into the lungs daily. 180 each 3   furosemide (LASIX) 20 MG tablet 1 tab po twice a day as needed for LE swelling 60 tablet 1   levothyroxine (SYNTHROID) 100 MCG tablet Take 1/2 of the 100 microgram tab daily 90 tablet 1   Lifitegrast (XIIDRA) 5 % SOLN Place 1 drop into both eyes in the morning and at bedtime.     Misc. Devices (PULSE OXIMETER) MISC Check oxygen level as needed 1 each 1   ondansetron (ZOFRAN-ODT) 4 MG disintegrating tablet Take 1 tablet (4 mg total) by mouth every 8 (eight) hours as needed for vomiting or nausea. 20 tablet 0   oxyCODONE (ROXICODONE) 5 MG immediate release tablet Take 1 tablet (5 mg total) by mouth every 6 (six) hours as needed for up to 5 doses for severe pain. 5 tablet 0   pantoprazole (PROTONIX) 40 MG tablet TAKE 1 TABLET(40 MG) BY MOUTH TWICE DAILY 180 tablet 0   roflumilast (DALIRESP) 500 MCG  TABS tablet Take 1 tablet (500 mcg total) by mouth daily. 90 tablet 3   spironolactone (ALDACTONE) 25 MG tablet TAKE 1 AND 1/2 TABLETS  BY MOUTH TWICE DAILY 270 tablet 0   venlafaxine (EFFEXOR) 50 MG tablet Take 1 tablet (50 mg total) by mouth 2 (two) times daily. 180 tablet 1   Vibegron (GEMTESA) 75 MG TABS Take 1 capsule by mouth daily. 30 tablet 11   vitamin B-12 (CYANOCOBALAMIN) 100 MCG tablet Take 100 mcg by mouth daily.     VITAMIN D PO Take 1 tablet by mouth daily.     No facility-administered medications prior to visit.    Allergies  Allergen Reactions   Losartan Other (See Comments)    hyperkalemia   Penicillins Hives, Swelling and Rash    Has patient had a PCN reaction causing immediate rash, facial/tongue/throat swelling, SOB or lightheadedness with hypotension: YES Has patient had a PCN reaction causing severe rash involving mucus membranes or skin necrosis: NO Has patient had a PCN reaction that required hospitalization NO Has patient had a PCN reaction occurring within the last 10 years: NO If all of the above answers are "NO", then may proceed with Cephalosporin use.    Cefixime [Kdc:Cefixime] Other (See Comments)    unspecified   Gabapentin Other (See Comments)    Feels woozy   Indocin [Indomethacin] Other (See Comments)    Painful tongue and throat   Singulair [Montelukast Sodium]     Chest tightness, decreased mental clarity   Sulfa Antibiotics Other (See Comments)    unspecified   Ace Inhibitors Other (See Comments)    cough   Statins Other (See Comments)    Myalgias     Review of Systems As per HPI  PE:    07/22/2022    2:39 PM 06/20/2022    2:46 PM 06/03/2022   11:29 AM  Vitals with BMI  Height 5\' 0"   5\' 0"   Weight 117 lbs 6 oz 113 lbs 6 oz 114 lbs 6 oz  BMI 22.92 22.15 22.34  Systolic 110 117 161  Diastolic 64 69 69  Pulse 71 80 84     Physical Exam  Gen: Alert, well appearing.  Patient is oriented to person, place, time, and  situation. AFFECT: pleasant, lucid thought and speech. Diffuse alopecia noted, primarily across the frontal region.  LABS:  Last CBC Lab Results  Component Value Date   WBC 11.9 (H) 05/17/2022   HGB 11.8 (L) 05/17/2022   HCT 37.7 05/17/2022   MCV 90.0 05/17/2022   MCH 28.2 05/17/2022   RDW 14.8 05/17/2022   PLT 259 05/17/2022   Last metabolic panel Lab Results  Component Value Date   GLUCOSE 100 (H) 05/17/2022   NA 142 05/17/2022   K 3.5 05/17/2022   CL 103 05/17/2022   CO2 30 05/17/2022   BUN 15 05/17/2022   CREATININE 0.81 05/17/2022   GFRNONAA >60 05/17/2022   CALCIUM 9.4 05/17/2022   PHOS 3.8 01/31/2013   PROT 6.1 (L) 10/29/2021   ALBUMIN 3.1 (L) 10/29/2021   BILITOT 0.4 10/29/2021   ALKPHOS 58 10/29/2021   AST 18 10/29/2021   ALT 42 10/29/2021   ANIONGAP 9 05/17/2022   Last lipids Lab Results  Component Value Date   CHOL 198 12/07/2020   HDL 66 12/07/2020   LDLCALC 113 (H) 12/07/2020   LDLDIRECT 131.3 03/07/2010   TRIG 88 12/07/2020   CHOLHDL 3.0 12/07/2020   Last thyroid functions Lab Results  Component Value Date   TSH 0.01 (L) 06/20/2022   T3TOTAL 97.2 07/11/2010   T4TOTAL 12.4 07/11/2010   Last vitamin D Lab Results  Component Value  Date   VD25OH 28.09 (L) 05/07/2022   Lab Results  Component Value Date   VITAMINB12 314 10/12/2018   IMPRESSION AND PLAN:  Hypothyroidism. Recent TSH values show over suppression. TSH last visit was 0.01 on 06/20/22.  I advised her to stop levothyroxine for 5 days, then restart at 1/2 of a 100 microgram tab daily. Monitor TSH today.  An After Visit Summary was printed and given to the patient.  FOLLOW UP: 3 months  Signed:  Santiago Bumpers, MD           07/31/2022

## 2022-07-31 NOTE — Therapy (Signed)
OUTPATIENT PHYSICAL THERAPY LOWER EXTREMITY TREATMENT    Patient Name: Priscilla Cantrell MRN: [id number] DOB:1935-04-04, 87 y.o., female Today's Date: 07/31/2022  END OF SESSION:  PT End of Session - 07/31/22 1225     Visit Number 4    Number of Visits 16    Date for PT Re-Evaluation 08/22/22    Authorization Type [employer]    PT Start Time 1226    PT Stop Time 1300    PT Time Calculation (min) 34 min    Equipment Utilized During Treatment Gait belt;Oxygen    Activity Tolerance Patient tolerated treatment well    Behavior During Therapy [facility] for tasks assessed/performed              Past Medical History:  Diagnosis Date   Allergic rhinitis    with upper airway cough: as of 07/2017 pulm f/u she was instructed to use astelin, flonase, and saline more consistently   BPPV (benign paroxysmal positional vertigo) 03/2018   C. difficile colitis    Chest pain, non-cardiac    Cardiac CT showed no signif obstructive dz, mildly elevated calcium level (Dr. Jens Som).   Chronic cystitis with hematuria    Dr. Lenon Curt   Chronic hypoxemic respiratory failure (HCC) 02/02/2015   2L oxygen 24/7   Chronic renal insufficiency, stage 2 (mild)    GFR approx 60   Cold hands    NCS/EMGs normal.  Hand dysesthesias per neuro--reassured   COPD (chronic obstructive pulmonary disease) (HCC)    GOLD 4.  spirometry 01/10/09 FEV 0.97(52%), FEV1% 47.  With chronic bronchitis as of 07/2017.   DDD (degenerative disc disease), cervical    1998 MRI C5-6 impingemt (left).  MRI 2021 (neurol) 1 level canal stenosis, 1 level foraminal stenosis--->PT   DDD (degenerative disc disease), lumbar    Depression with anxiety 04/15/2011   Diarrheal disease Summer 2017   ? 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 Dr Kinnie Scales).  As of 10/05/15 pt reports GI dx'd her with C diff and rx'd more flagyl and she is also on  cholestyramine.  As of 02/2016, pt's sx's resolved completely.   Diverticulosis of colon    Procto '97 and colonoscopy 2002   Dysplastic nevus of upper extremity 04/2014   R tricep (Dr. Jorja Loa)   Edema of both lower extremities due to peripheral venous insufficiency    Varicosities bilat, swelling L>R.  R baker's cyst.  Got vein clinic eval 02/2018->sclerotherapy recommended but since no hemorrhage or ulceration, insurance will not cover the procedure.   Family history of adverse reaction to anesthesia    mother died when patient age 57 receiving anesthesia for thyroid surgery   Fibrocystic breast disease    w/fibroadenoma.  Bx's showed NO atypia (Dr. Jamey Ripa).  Mammo neg 2008.   GERD (gastroesophageal reflux disease)    History of double vision    Ophthalmologist, Dr. Elmer Picker, is further evaluating this with MRI  orbits and limited brain (myesthenia gravis testing neg 07/2010)   History of home oxygen therapy    uses 2 liters at hs   Hypertension    EKG 03/2010 normal   Hypothyroidism    Hashimoto's, dx'd 1989   Idiopathic peripheral neuropathy 04/2018   Sensory (numb feet) S1 distribution bilat, c/w spinal stenosis sensory neuropathy (no pain). 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  Labs [place], MRI C spine->some spondylosis/stenosis.  UE NCS/EMS: normal 04/2019.  Lesion of right native kidney 2017   found on CT 02/2015.  F/u MRI 03/31/15 showed it to be smaller and less likely of any significance but repeat CT renal protocol in 64mo was recommended by radiology.  This was done 10/26/15 and showed no interval change in the lesion, but b/c the lesion enhances with contrast, radiol rec'd urol consult (b/c pap renal RCC could not be excluded).  Saw urol 03/06/16--stable on u/s 09/2016 and 09/2017.   OAB (overactive bladder)    Osteoarthritis, hip, bilateral    she is s/p bilat THA as of 02/2018.   Osteoporosis    radius, T score  -2.5->rec'd alendronate 08/2019->plan rpt DEXA 08/2021.   Pneumonia    Postmenopausal atrophic vaginitis    improved with estrace 2 X/week.   Prolapse of vaginal cuff after hysterectomy    10/2018: Dr. Katherina Right. Mild colpocele 04/2019 per GYN (no surgery)   Pulmonary nodule    CT chest 12/10, June 2011.  No nodule on CT 06/2010.   Recurrent Clostridium difficile diarrhea    Episode 09/19/2016: resolved with prolonged course of flagyl.  11/2016 recurrence treated with 10d of Dificid (Fidaxomicin) by GI (Dr. Kinnie Scales).   Recurrent UTI    likely related to pt's atrophic vaginitis.  Urol started vaginal estrace 03/2016.   Rib fracture 09/2018   R 7th, laterally-->sustained when her dog pulled her over and she fell.   RLS (restless legs syndrome)    neuro->gabapentin trial 2020/21   Shingles 02/2014   R abd/flank   Spinal stenosis of lumbar region with neurogenic claudication    2008 MRI (L3-4, L4-5, L5-S1).  +S1 distribution sensory loss bilat   Past Surgical History:  Procedure Laterality Date   ABDOMINAL HYSTERECTOMY  1978   no BSO per pt--nonmalignant reasons   APPENDECTOMY     BREAST CYST EXCISION     Last screening mammogram 10/2010 was normal.   CHOLECYSTECTOMY  2001   COLONOSCOPY  04/2005; 12/06/2015   2007 NORMAL.  2017 adenomatous polyp x 1.  Mod sigmoid diverticulosis (Dr. Kinnie Scales).     DEXA  05/18/2017; 08/7019   2019 NORMAL (T-score 0.1).  08/2019->T score -2.5->alendronate started->plan Rpt 08/2021.  03/2022 T score -2.7   HIP CLOSED REDUCTION Right 12/25/2015   Procedure: CLOSED REDUCTION HIP;  Surgeon: Yolonda Kida, MD;  Location: [facility];  Service: Orthopedics;  Laterality: Right;   NCV/EMS  10/2018   chronic and symmetric sensorimotor axonal polyneuropathy affecting the lower extremities   TONSILLECTOMY     TOTAL HIP ARTHROPLASTY Right 11/01/2014   Procedure: RIGHT TOTAL HIP ARTHROPLASTY;  Surgeon: Ranee Gosselin, MD;  Location: [facility];  Service: Orthopedics;  Laterality:  Right;   TOTAL HIP ARTHROPLASTY Left 12/08/2017   Procedure: LEFT TOTAL HIP ARTHROPLASTY ANTERIOR APPROACH;  Surgeon: Durene Romans, MD;  Location: [facility];  Service: Orthopedics;  Laterality: Left;  70 mins   TRANSTHORACIC ECHOCARDIOGRAM  08/2014   2016 EF 60-65%, grade I diast dysfxn. 06/21/21 unchanged.   TUBAL LIGATION  1974   Patient Active Problem List   Diagnosis Date Noted   Sepsis (HCC) 02/22/2022   Acute on chronic respiratory failure with hypoxia (HCC) 02/21/2022   Influenza A 02/21/2022   Shortness of breath 12/08/2019   Swelling of lower extremity 12/08/2019   Stress due to family tension 12/08/2019   Chronic cystitis with hematuria 08/15/2019   Urinary urgency 08/15/2019   Multiple closed fractures of ribs of right side 10/05/2018   Healthcare maintenance 10/05/2018  Medication management 07/29/2018   Overweight (BMI 25.0-29.9) 12/09/2017   S/P left KA 12/08/2017   S/P left THA, AA 12/08/2017   Postherpetic neuralgia 03/07/2015   Constipation 02/28/2015   Renal mass 02/28/2015   Acute pyelonephritis 02/20/2015   Oral thrush 02/08/2015   Chronic hypoxemic respiratory failure (HCC) 02/02/2015   SOB (shortness of breath)    Essential hypertension 01/24/2015   UTI (urinary tract infection) 01/09/2015   Pain and swelling of right lower leg 01/09/2015   H/O total hip arthroplasty 11/01/2014   Spinal stenosis, lumbar region, with neurogenic claudication 01/04/2014   Osteoarthritis, hip, bilateral 07/27/2013   Sciatica associated with disorder of lumbosacral spine 04/19/2013   Atrophic vaginitis 01/12/2012   Periodic limb movement disorder 11/24/2011   Hypoxia 05/22/2011   Depression with anxiety 04/15/2011   Chronic venous insufficiency 07/29/2010   Hypothyroidism due to Hashimoto's thyroiditis 01/07/2010   SPINAL STENOSIS 01/07/2010   Allergic rhinitis 08/09/2007   COPD GOLD III B 08/09/2007    PCP: Nicoletta Ba  REFERRING PROVIDER: Nicoletta Ba  REFERRING DIAG: Unsteady gait, recurrent falls, R hip dislocations   THERAPY DIAG:  Other abnormalities of gait and mobility  Muscle weakness (generalized)  Rationale for Evaluation and Treatment: Rehabilitation  ONSET DATE: 1 month ago  SUBJECTIVE:   SUBJECTIVE STATEMENT: Pt late to appt today. States doing well. Has been getting around the house better.   Eval: Pt states 2 falls recently, both when coming out of the shower. She dislocated R hip both times.  Had  R hip replacement in 2016. Has also had L THA with 1 dislocation soon after surgery, but has been fine since then.  She states Mild soreness in R hip. She feels unsteady around the house and is afraid to fall again. She does not use AD.  She has portable O2 with her today, on 3 L.  Normally wears 24/7, 2 L at home. Long term partner here with her today to help answer questions.    PERTINENT HISTORY: 2 recent falls with R hip dislocations, chronic respiriatory failure,  chronic renal insufficiency, Copd,  osteoporosis,    PAIN:  Are you having pain? Yes: NPRS scale: 3/10 Pain location: R hip Pain description: sore Aggravating factors: palpation, movement  Relieving factors: none stated   PRECAUTIONS: Other: on O2  (3 L on portable,   2L at home)   WEIGHT BEARING RESTRICTIONS: No  FALLS:  Has patient fallen in last 6 months? Yes. Number of falls 2  LIVING ENVIRONMENT: On 1 floor, small step/lip on walk in shower. Does not have walker.   PLOF: Independent  PATIENT GOALS: no more falls  NEXT MD VISIT:   OBJECTIVE:   DIAGNOSTIC FINDINGS:   PATIENT SURVEYS:    COGNITION: Overall cognitive status: History of cognitive impairments - at baseline/mild     SENSATION:   EDEMA:    POSTURE: No Significant postural limitations  PALPATION: Mild soreness in R gr troch.  Atrophy around R lateral hip.   LOWER EXTREMITY ROM:  L hip: WFL R hip:  gross flexion/ER WFL.     LOWER EXTREMITY  MMT:  MMT Right eval Left eval  Hip flexion 4- 4-  Hip extension    Hip abduction    Hip adduction    Hip internal rotation    Hip external rotation 4- 4-  Knee flexion 4 4  Knee extension 4 4  Ankle dorsiflexion    Ankle plantarflexion    Ankle inversion  Ankle eversion     (Blank rows = not tested)  LOWER EXTREMITY SPECIAL TESTS:    GAIT: Distance walked: 200 Assistive device utilized: None Level of assistance: SBA Comments:       TODAY'S TREATMENT:                                                                                                                              DATE:    08/01/2022 Ther ex:  Seated:   Hip abd GTB x 20;    Supine: pelvic tilts x 15;    S/L:  Standing: CGA-   Side stepping 25 ft x 4;  step up/over 3 in box 25 ft x 6 NMR:    Theraputic activities: sit to stand higher mat table 4 x 5, with education on LE positioning and fwd weight shift.  stairs up/down 4 in and 6 in x 5 ea no hand rails, CGA, with cuing for step-to pattern ( pt able to do reciprocal at times)     Seated: Hip add Iso squeeze 5 sec x 20 with practice for upright posture;    Hip abd GTB ISO x 20;   sit to stand higher mat table 4 x 5, with education on LE positioning and fwd weight shift.  Supine: pelvic tilts x 15;   Clams GTB/iso/small ROM x 20;  S/L: clams x 10;  Standing: CGA-   Side stepping length of table x 8,  Marching with counter support x 20;  NMR:   stairs up/down 4 in and 6 in x 5 ea no hand rails, CGA,  Stepping up over 3 in object, 30 ft x 5;      PATIENT EDUCATION:  Education details: updated and reviewed HEP, reviewed home safety , discussed not getting down on floor for exercises,  and other positions that would be likely to cause dislocations/ to avoid.  Person educated: Patient, husband  Education method: Explanation, Demonstration, Tactile cues, Verbal cues, and Handouts Education comprehension: verbalized understanding, returned demonstration,  verbal cues required, tactile cues required, and needs further education   HOME EXERCISE PROGRAM: Access Code: [email or web address]   ASSESSMENT:  CLINICAL IMPRESSION: Pt with improving ability and stability with dynamic standing exercises today. She has improved safety with use of hand rails on stairs, but able to do lower step height (4 in) without. Requires mod-max cuing for step - to pattern, and tends to do reciprocal at times, without too much difficulty. She has mild difficulty with vision/depth perception and stripe pattern on stairs here today. She is doing well with improved safety and functional activity, and has been able to do more at home, with less fear of falling.   Eval: Patient presents with primary complaint of 2 recent falls with 2 hip dislocations. She does not have significant balance deficits with short distance walking and does not use AD. She has fallen both times coming out of shower. Discussed home safety and using  grab bars as much as possible when stepping out, and showering in mornings if pt is more alert at that time. She does have strength deficits in bil hips and LE. She has decreased ability and confidence with dynamic balance and stairs. Pt to benefit from skilled PT to improve deficits and safety.   OBJECTIVE IMPAIRMENTS: Abnormal gait, cardiopulmonary status limiting activity, decreased activity tolerance, decreased balance, decreased coordination, decreased endurance, decreased knowledge of use of DME, decreased mobility, difficulty walking, decreased strength, decreased safety awareness, and pain.   ACTIVITY LIMITATIONS: lifting, bending, standing, squatting, stairs, transfers, bathing, hygiene/grooming, and locomotion level  PARTICIPATION LIMITATIONS: cleaning, shopping, and community activity  PERSONAL FACTORS: 1-2 comorbidities: falls and hip dislocations   are also affecting patient's functional outcome.   REHAB POTENTIAL: Good  CLINICAL DECISION MAKING:  Evolving/moderate complexity  EVALUATION COMPLEXITY: Moderate   GOALS: Goals reviewed with patient? Yes  SHORT TERM GOALS: Target date: 07/11/2022  Pt to be independent with initial HEP  Goal status: INITIAL  2.  Pt to demo ability for SLR without hip IR weakness/rotation.  Goal status: INITIAL    LONG TERM GOALS: Target date: 08/22/2022   Pt to be independent with final HEP  Goal status: INITIAL  2.  Pt to demo improved Bil hip strength to max ability, to improve stability and reduce risk for dislocation.   Goal status: INITIAL  3.  Pt to demo ability for safe stair climbing, with use of hand rails as needed, to improve safety with community activity.   Goal status: INITIAL  4.  Pt to demo improved score on BERG by at least 5 points.    Goal status: INITIAL     PLAN:  PT FREQUENCY: 1-2x/week  PT DURATION: 8 weeks  PLANNED INTERVENTIONS: Therapeutic exercises, Therapeutic activity, Neuromuscular re-education, Patient/Family education, Self Care, Joint mobilization, Joint manipulation, Stair training, Orthotic/Fit training, DME instructions, Aquatic Therapy, Dry Needling, Electrical stimulation, Cryotherapy, Moist heat, Taping, Ultrasound, Ionotophoresis 4mg /ml Dexamethasone, Manual therapy,  Vasopneumatic device, Traction, Spinal manipulation, Spinal mobilization,Balance training, Gait training,   PLAN FOR NEXT SESSION: stairs, standing exercises, gait, balance, hip strength, safety   Sedalia Muta, PT, DPT 9:05 AM  08/01/22

## 2022-08-01 LAB — TSH: TSH: 0.02 u[IU]/mL — ABNORMAL LOW (ref 0.35–5.50)

## 2022-08-04 ENCOUNTER — Ambulatory Visit: Payer: Medicare PPO

## 2022-08-04 ENCOUNTER — Telehealth: Payer: Self-pay

## 2022-08-04 DIAGNOSIS — E039 Hypothyroidism, unspecified: Secondary | ICD-10-CM

## 2022-08-04 NOTE — Telephone Encounter (Signed)
Received a call from Clydie Braun regarding add on labs, future orders placed to send to [facility].

## 2022-08-05 LAB — T3: T3, Total: 107 ng/dL (ref 76–181)

## 2022-08-05 LAB — T4, FREE: Free T4: 2 ng/dL — ABNORMAL HIGH (ref 0.8–1.8)

## 2022-08-11 ENCOUNTER — Telehealth: Payer: Self-pay

## 2022-08-11 DIAGNOSIS — E039 Hypothyroidism, unspecified: Secondary | ICD-10-CM

## 2022-08-11 MED ORDER — LEVOTHYROXINE SODIUM 100 MCG PO TABS: ORAL_TABLET | ORAL | 1 refills | Status: DC

## 2022-08-11 NOTE — Telephone Encounter (Signed)
LVM for pt or pt's bf to call back regarding results. Please schedule for lab repeat in 6 weeks.  Med list updated to reflect updated directions, bo new rx was sent.

## 2022-08-11 NOTE — Telephone Encounter (Signed)
-----   Message from Jeoffrey Massed, MD sent at 08/09/2022  2:59 PM EDT ----- Pls notify pt/caregiver that her thyroid level is still too high. Decrease dose to 1/2 of a 100 mcg tab every other day. Plan lab visit for TSH 6 wks, dx acquired hypothyroidism. thx

## 2022-08-12 ENCOUNTER — Encounter: Payer: Self-pay | Admitting: Physical Therapy

## 2022-08-12 ENCOUNTER — Ambulatory Visit: Payer: Medicare PPO | Admitting: Physical Therapy

## 2022-08-12 DIAGNOSIS — J449 Chronic obstructive pulmonary disease, unspecified: Secondary | ICD-10-CM | POA: Diagnosis not present

## 2022-08-12 DIAGNOSIS — R2689 Other abnormalities of gait and mobility: Secondary | ICD-10-CM | POA: Diagnosis not present

## 2022-08-12 DIAGNOSIS — J984 Other disorders of lung: Secondary | ICD-10-CM | POA: Diagnosis not present

## 2022-08-12 DIAGNOSIS — Z96641 Presence of right artificial hip joint: Secondary | ICD-10-CM | POA: Diagnosis not present

## 2022-08-12 DIAGNOSIS — M6281 Muscle weakness (generalized): Secondary | ICD-10-CM | POA: Diagnosis not present

## 2022-08-12 DIAGNOSIS — R0902 Hypoxemia: Secondary | ICD-10-CM | POA: Diagnosis not present

## 2022-08-12 NOTE — Therapy (Signed)
OUTPATIENT PHYSICAL THERAPY LOWER EXTREMITY TREATMENT    Patient Name: Priscilla Cantrell MRN: [id number] DOB:September 24, 1935, 87 y.o., female Today's Date: 08/12/2022  END OF SESSION:  PT End of Session - 08/12/22 1308     Visit Number 5    Number of Visits 16    Date for PT Re-Evaluation 08/22/22    Authorization Type [employer]    PT Start Time 1310    PT Stop Time 1350    PT Time Calculation (min) 40 min    Equipment Utilized During Treatment Gait belt;Oxygen    Activity Tolerance Patient tolerated treatment well    Behavior During Therapy [facility] [place] for tasks assessed/performed              Past Medical History:  Diagnosis Date   Allergic rhinitis    with upper airway cough: as of 07/2017 pulm f/u she was instructed to use astelin, flonase, and saline more consistently   BPPV (benign paroxysmal positional vertigo) 03/2018   C. difficile colitis    Chest pain, non-cardiac    Cardiac CT showed no signif obstructive dz, mildly elevated calcium level (Dr. Jens Som).   Chronic cystitis with hematuria    Dr. Lenon Curt   Chronic hypoxemic respiratory failure (HCC) 02/02/2015   2L oxygen 24/7   Chronic renal insufficiency, stage 2 (mild)    GFR approx 60   Cold hands    NCS/EMGs normal.  Hand dysesthesias per neuro--reassured   COPD (chronic obstructive pulmonary disease) (HCC)    GOLD 4.  spirometry 01/10/09 FEV 0.97(52%), FEV1% 47.  With chronic bronchitis as of 07/2017.   DDD (degenerative disc disease), cervical    1998 MRI C5-6 impingemt (left).  MRI 2021 (neurol) 1 level canal stenosis, 1 level foraminal stenosis--->PT   DDD (degenerative disc disease), lumbar    Depression with anxiety 04/15/2011   Diarrheal disease Summer 2017   ? 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 Dr Kinnie Scales).  As of 10/05/15 pt reports GI dx'd her with C diff and rx'd more flagyl and she is also on  cholestyramine.  As of 02/2016, pt's sx's resolved completely.   Diverticulosis of colon    Procto '97 and colonoscopy 2002   Dysplastic nevus of upper extremity 04/2014   R tricep (Dr. Jorja Loa)   Edema of both lower extremities due to peripheral venous insufficiency    Varicosities bilat, swelling L>R.  R baker's cyst.  Got vein clinic eval 02/2018->sclerotherapy recommended but since no hemorrhage or ulceration, insurance will not cover the procedure.   Family history of adverse reaction to anesthesia    mother died when patient age 66 receiving anesthesia for thyroid surgery   Fibrocystic breast disease    w/fibroadenoma.  Bx's showed NO atypia (Dr. Jamey Ripa).  Mammo neg 2008.   GERD (gastroesophageal reflux disease)    History of double vision    Ophthalmologist, Dr. Elmer Picker, is further evaluating this with MRI  orbits and limited brain (myesthenia gravis testing neg 07/2010)   History of home oxygen therapy    uses 2 liters at hs   Hypertension    EKG 03/2010 normal   Hypothyroidism    Hashimoto's, dx'd 1989   Idiopathic peripheral neuropathy 04/2018   Sensory (numb feet) S1 distribution bilat, c/w spinal stenosis sensory neuropathy (no pain). 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  Labs [place], MRI C spine->some spondylosis/stenosis.  UE NCS/EMS: normal 04/2019.  Lesion of right native kidney 2017   found on CT 02/2015.  F/u MRI 03/31/15 showed it to be smaller and less likely of any significance but repeat CT renal protocol in 40mo was recommended by radiology.  This was done 10/26/15 and showed no interval change in the lesion, but b/c the lesion enhances with contrast, radiol rec'd urol consult (b/c pap renal RCC could not be excluded).  Saw urol 03/06/16--stable on u/s 09/2016 and 09/2017.   OAB (overactive bladder)    Osteoarthritis, hip, bilateral    she is s/p bilat THA as of 02/2018.   Osteoporosis    radius, T score  -2.5->rec'd alendronate 08/2019->plan rpt DEXA 08/2021.   Pneumonia    Postmenopausal atrophic vaginitis    improved with estrace 2 X/week.   Prolapse of vaginal cuff after hysterectomy    10/2018: Dr. Katherina Right. Mild colpocele 04/2019 per GYN (no surgery)   Pulmonary nodule    CT chest 12/10, June 2011.  No nodule on CT 06/2010.   Recurrent Clostridium difficile diarrhea    Episode 09/19/2016: resolved with prolonged course of flagyl.  11/2016 recurrence treated with 10d of Dificid (Fidaxomicin) by GI (Dr. Kinnie Scales).   Recurrent UTI    likely related to pt's atrophic vaginitis.  Urol started vaginal estrace 03/2016.   Rib fracture 09/2018   R 7th, laterally-->sustained when her dog pulled her over and she fell.   RLS (restless legs syndrome)    neuro->gabapentin trial 2020/21   Shingles 02/2014   R abd/flank   Spinal stenosis of lumbar region with neurogenic claudication    2008 MRI (L3-4, L4-5, L5-S1).  +S1 distribution sensory loss bilat   Past Surgical History:  Procedure Laterality Date   ABDOMINAL HYSTERECTOMY  1978   no BSO per pt--nonmalignant reasons   APPENDECTOMY     BREAST CYST EXCISION     Last screening mammogram 10/2010 was normal.   CHOLECYSTECTOMY  2001   COLONOSCOPY  04/2005; 12/06/2015   2007 NORMAL.  2017 adenomatous polyp x 1.  Mod sigmoid diverticulosis (Dr. Kinnie Scales).     DEXA  05/18/2017; 08/7019   2019 NORMAL (T-score 0.1).  08/2019->T score -2.5->alendronate started->plan Rpt 08/2021.  03/2022 T score -2.7   HIP CLOSED REDUCTION Right 12/25/2015   Procedure: CLOSED REDUCTION HIP;  Surgeon: Yolonda Kida, MD;  Location: [facility];  Service: Orthopedics;  Laterality: Right;   NCV/EMS  10/2018   chronic and symmetric sensorimotor axonal polyneuropathy affecting the lower extremities   TONSILLECTOMY     TOTAL HIP ARTHROPLASTY Right 11/01/2014   Procedure: RIGHT TOTAL HIP ARTHROPLASTY;  Surgeon: Ranee Gosselin, MD;  Location: [facility];  Service: Orthopedics;  Laterality:  Right;   TOTAL HIP ARTHROPLASTY Left 12/08/2017   Procedure: LEFT TOTAL HIP ARTHROPLASTY ANTERIOR APPROACH;  Surgeon: Durene Romans, MD;  Location: [facility];  Service: Orthopedics;  Laterality: Left;  70 mins   TRANSTHORACIC ECHOCARDIOGRAM  08/2014   2016 EF 60-65%, grade I diast dysfxn. 06/21/21 unchanged.   TUBAL LIGATION  1974   Patient Active Problem List   Diagnosis Date Noted   Sepsis (HCC) 02/22/2022   Acute on chronic respiratory failure with hypoxia (HCC) 02/21/2022   Influenza A 02/21/2022   Shortness of breath 12/08/2019   Swelling of lower extremity 12/08/2019   Stress due to family tension 12/08/2019   Chronic cystitis with hematuria 08/15/2019   Urinary urgency 08/15/2019   Multiple closed fractures of ribs of right side 10/05/2018   Healthcare maintenance 10/05/2018  Medication management 07/29/2018   Overweight (BMI 25.0-29.9) 12/09/2017   S/P left KA 12/08/2017   S/P left THA, AA 12/08/2017   Postherpetic neuralgia 03/07/2015   Constipation 02/28/2015   Renal mass 02/28/2015   Acute pyelonephritis 02/20/2015   Oral thrush 02/08/2015   Chronic hypoxemic respiratory failure (HCC) 02/02/2015   SOB (shortness of breath)    Essential hypertension 01/24/2015   UTI (urinary tract infection) 01/09/2015   Pain and swelling of right lower leg 01/09/2015   H/O total hip arthroplasty 11/01/2014   Spinal stenosis, lumbar region, with neurogenic claudication 01/04/2014   Osteoarthritis, hip, bilateral 07/27/2013   Sciatica associated with disorder of lumbosacral spine 04/19/2013   Atrophic vaginitis 01/12/2012   Periodic limb movement disorder 11/24/2011   Hypoxia 05/22/2011   Depression with anxiety 04/15/2011   Chronic venous insufficiency 07/29/2010   Hypothyroidism due to Hashimoto's thyroiditis 01/07/2010   SPINAL STENOSIS 01/07/2010   Allergic rhinitis 08/09/2007   COPD GOLD III B 08/09/2007    PCP: Nicoletta Ba  REFERRING PROVIDER: Nicoletta Ba  REFERRING DIAG: Unsteady gait, recurrent falls, R hip dislocations   THERAPY DIAG:  Other abnormalities of gait and mobility  Muscle weakness (generalized)  Rationale for Evaluation and Treatment: Rehabilitation  ONSET DATE: 1 month ago  SUBJECTIVE:   SUBJECTIVE STATEMENT: Pt states doing well. She is getting around well at home.   Eval: Pt states 2 falls recently, both when coming out of the shower. She dislocated R hip both times.  Had  R hip replacement in 2016. Has also had L THA with 1 dislocation soon after surgery, but has been fine since then.  She states Mild soreness in R hip. She feels unsteady around the house and is afraid to fall again. She does not use AD.  She has portable O2 with her today, on 3 L.  Normally wears 24/7, 2 L at home. Long term partner here with her today to help answer questions.    PERTINENT HISTORY: 2 recent falls with R hip dislocations, chronic respiriatory failure,  chronic renal insufficiency, Copd,  osteoporosis,    PAIN:  Are you having pain? Yes: NPRS scale: 0-2/10 Pain location: R hip Pain description: sore Aggravating factors: palpation, movement  Relieving factors: none stated   PRECAUTIONS: Other: on O2  (3 L on portable,   2L at home)   WEIGHT BEARING RESTRICTIONS: No  FALLS:  Has patient fallen in last 6 months? Yes. Number of falls 2  LIVING ENVIRONMENT: On 1 floor, small step/lip on walk in shower. Does not have walker.   PLOF: Independent  PATIENT GOALS: no more falls  NEXT MD VISIT:   OBJECTIVE:   DIAGNOSTIC FINDINGS:   PATIENT SURVEYS:    COGNITION: Overall cognitive status: History of cognitive impairments - at baseline/mild     SENSATION:   EDEMA:    POSTURE: No Significant postural limitations  PALPATION:   LOWER EXTREMITY ROM:  L hip: WFL R hip:  gross flexion/ER WFL.     LOWER EXTREMITY MMT:  MMT Right eval Left eval R 08/12/22 L 08/12/22  Hip flexion 4- 4- 4 4  Hip extension       Hip abduction      Hip adduction      Hip internal rotation      Hip external rotation 4- 4- 4 4  Knee flexion 4 4 4+ 4+  Knee extension 4 4 4+ 4+  Ankle dorsiflexion      Ankle plantarflexion  Ankle inversion      Ankle eversion       (Blank rows = not tested)  LOWER EXTREMITY SPECIAL TESTS:   .ptass  GAIT: Distance walked: 200 Assistive device utilized: None Level of assistance: Complete Independence and SBA Comments:     [facility] PT Assessment - 08/12/22 0001       Standardized Balance Assessment   Standardized Balance Assessment Timed Up and Go Test;Berg Balance Test      Berg Balance Test   Sit to Stand Able to stand without using hands and stabilize independently    Standing Unsupported Able to stand safely 2 minutes    Sitting with Back Unsupported but Feet Supported on Floor or Stool Able to sit safely and securely 2 minutes    Stand to Sit Sits safely with minimal use of hands    Transfers Able to transfer safely, minor use of hands    Standing Unsupported with Eyes Closed Able to stand 10 seconds with supervision    Standing Unsupported with Feet Together Able to place feet together independently and stand for 1 minute with supervision    From Standing, Reach Forward with Outstretched Arm Can reach forward >12 cm safely (5")    From Standing Position, Pick up Object from Floor Able to pick up shoe, needs supervision    From Standing Position, Turn to Look Behind Over each Shoulder Looks behind one side only/other side shows less weight shift    Turn 360 Degrees Able to turn 360 degrees safely but slowly    Standing Unsupported, Alternately Place Feet on Step/Stool Able to stand independently and complete 8 steps >20 seconds    Standing Unsupported, One Foot in Front Able to take small step independently and hold 30 seconds    Standing on One Leg Tries to lift leg/unable to hold 3 seconds but remains standing independently    Total Score 43                TODAY'S TREATMENT:                                                                                                                              DATE:    08/12/2022 Ther ex:  Seated:   Hip abd GTB x 20;   Hip add iso x 15 ;  sit to stand, no UE support x 12;  Supine: clams GTB x 20;  SLR x 15 bil;  S/L:  Standing: CGA-   Side stepping 10 ft x 6;  marching x 20; stairs up/down 4 in and 6 in x 5 ea 1 light UE support;  NMR:         PATIENT EDUCATION:  Education details: updated and reviewed HEP,  Person educated: Patient, husband  Education method: Explanation, Demonstration, Tactile cues, Verbal cues, and Handouts Education comprehension: verbalized understanding, returned demonstration, verbal cues required, tactile cues required, and needs further education   HOME EXERCISE  PROGRAM: Access Code: [email or web address]   ASSESSMENT:  CLINICAL IMPRESSION: Pt has made good progress. She is getting around at home very well, and has not had any more falls. She is doing well with strengthening and HEP. Updated and reviewed HEP to continue today for LE strength. Reviewed home safety and oxygen safety. Her partner accompanies her to visits and reports she is doing very well. Pt ready for d/c to HEP at this time.   Eval: Patient presents with primary complaint of 2 recent falls with 2 hip dislocations. She does not have significant balance deficits with short distance walking and does not use AD. She has fallen both times coming out of shower. Discussed home safety and using grab bars as much as possible when stepping out, and showering in mornings if pt is more alert at that time. She does have strength deficits in bil hips and LE. She has decreased ability and confidence with dynamic balance and stairs. Pt to benefit from skilled PT to improve deficits and safety.   OBJECTIVE IMPAIRMENTS: Abnormal gait, cardiopulmonary status limiting activity, decreased activity tolerance, decreased balance,  decreased coordination, decreased endurance, decreased knowledge of use of DME, decreased mobility, difficulty walking, decreased strength, decreased safety awareness, and pain.   ACTIVITY LIMITATIONS: lifting, bending, standing, squatting, stairs, transfers, bathing, hygiene/grooming, and locomotion level  PARTICIPATION LIMITATIONS: cleaning, shopping, and community activity  PERSONAL FACTORS: 1-2 comorbidities: falls and hip dislocations   are also affecting patient's functional outcome.   REHAB POTENTIAL: Good  CLINICAL DECISION MAKING: Evolving/moderate complexity  EVALUATION COMPLEXITY: Moderate   GOALS: Goals reviewed with patient? Yes  SHORT TERM GOALS: Target date: 07/11/2022  Pt to be independent with initial HEP  Goal status: MET  2.  Pt to demo ability for SLR without hip IR weakness/rotation.  Goal status: MET    LONG TERM GOALS: Target date: 08/22/2022   Pt to be independent with final HEP  Goal status: MET  2.  Pt to demo improved Bil hip strength to max ability, to improve stability and reduce risk for dislocation.   Goal status: MET  3.  Pt to demo ability for safe stair climbing, with use of hand rails as needed, to improve safety with community activity.   Goal status: MET  4.  Pt to demo improved score on BERG by at least 5 points.    Goal status: MET     PLAN:  PT FREQUENCY: 1-2x/week  PT DURATION: 8 weeks  PLANNED INTERVENTIONS: Therapeutic exercises, Therapeutic activity, Neuromuscular re-education, Patient/Family education, Self Care, Joint mobilization, Joint manipulation, Stair training, Orthotic/Fit training, DME instructions, Aquatic Therapy, Dry Needling, Electrical stimulation, Cryotherapy, Moist heat, Taping, Ultrasound, Ionotophoresis 4mg /ml Dexamethasone, Manual therapy,  Vasopneumatic device, Traction, Spinal manipulation, Spinal mobilization,Balance training, Gait training,   PLAN FOR NEXT SESSION:    Sedalia Muta, PT,  DPT 1:59 PM  08/12/22  PHYSICAL THERAPY DISCHARGE SUMMARY  Visits from Start of Care:  5  Plan: Patient agrees to discharge.  Patient goals were met. Patient is being discharged due to meeting the stated rehab goals.     Sedalia Muta, PT, DPT 2:06 PM  08/12/22

## 2022-08-13 ENCOUNTER — Telehealth: Payer: Self-pay

## 2022-08-13 NOTE — Telephone Encounter (Signed)
Please Advise

## 2022-08-13 NOTE — Telephone Encounter (Signed)
Patient states she is crying a lot, she is sleeping a lot, and her hair is falling out. Patient didn't know if it could be her thyroid. I was trying to schedule her an appt, Dr. Milinda Cave has no openings until 7/25. Patient is a 40 minute patient.  Can we schedule her in 20 minute appt slot or have her wait until 7/25?  Please advise.  Please call patient [phone]

## 2022-08-14 NOTE — Telephone Encounter (Signed)
LVM for pt to return call. Please inform pt of message below. She will need 2 separate appointments, 1) lab visit for repeat lab 2) provider appt to address depression   Jeoffrey Massed, MD 08/09/2022  2:59 PM EDT     Pls notify pt/caregiver that her thyroid level is still too high. Decrease dose to 1/2 of a 100 mcg tab every other day. Plan lab visit for TSH 6 wks, dx acquired hypothyroidism. thx

## 2022-08-14 NOTE — Telephone Encounter (Signed)
Yes, this could be due to her abnormal thyroid level lately. Make sure she got the latest instructions regarding dose adjustment.  It will take weeks to feel better. It is okay to fit her in a 20-minute spot if needed to address the depression

## 2022-08-15 ENCOUNTER — Encounter: Payer: Self-pay | Admitting: Pulmonary Disease

## 2022-08-15 ENCOUNTER — Ambulatory Visit (INDEPENDENT_AMBULATORY_CARE_PROVIDER_SITE_OTHER): Payer: Medicare PPO | Admitting: Pulmonary Disease

## 2022-08-15 VITALS — BP 138/72 | HR 76 | Ht 60.0 in | Wt 116.2 lb

## 2022-08-15 DIAGNOSIS — J439 Emphysema, unspecified: Secondary | ICD-10-CM | POA: Diagnosis not present

## 2022-08-15 DIAGNOSIS — Z8616 Personal history of COVID-19: Secondary | ICD-10-CM

## 2022-08-15 DIAGNOSIS — J9611 Chronic respiratory failure with hypoxia: Secondary | ICD-10-CM

## 2022-08-15 DIAGNOSIS — J4489 Other specified chronic obstructive pulmonary disease: Secondary | ICD-10-CM | POA: Diagnosis not present

## 2022-08-15 NOTE — Patient Instructions (Signed)
You can call [phone] in October to schedule a follow up appointment

## 2022-08-15 NOTE — Progress Notes (Signed)
te  [facility]  Chief Complaint  Patient presents with   Follow-up    Breathing is about the same.     Constitutional:  BP 138/72 (BP Location: Left Arm, Cuff Size: Normal)   Pulse 76   Ht 5' (1.524 m)   Wt 116 lb 3.2 oz (52.7 kg)   SpO2 93% Comment: 2lpm pulsed  BMI 22.69 kg/m   Past Medical History:  Chronic cystitis, DDD, Anxiety, Depression, C diff 2017 and 2018, Diverticulosis, GERD, HTN, Hypothyroidism, PNA, Shingles, Spinal stenosis  Past Surgical History:  She  has a past surgical history that includes Cholecystectomy (2001); Tubal ligation (1974); Abdominal hysterectomy (1978); Breast cyst excision; Appendectomy; Tonsillectomy; Colonoscopy (04/2005; 12/06/2015); transthoracic echocardiogram (08/2014); Total hip arthroplasty (Right, 11/01/2014); Hip Closed Reduction (Right, 12/25/2015); DEXA (05/18/2017; 08/7019); Total hip arthroplasty (Left, 12/08/2017); and NCV/EMS (10/2018).  Brief Summary:  Priscilla Cantrell is a 87 y.o. female former smoker with GOLD 4 COPD, ACE cough, and nocturnal hypoxemia.       Subjective:   She is here with her fiance.  She has been losing her hair.  She is worried that her thyroid function isn't right.  She gets weak voice quality sometimes.  Can't drive anymore because she has trouble with her vision.  Her fiance feels she gets confused sometimes, and sometimes sees things that aren't there.  Her breathing has been okay.  She sometimes forgets to put on her oxygen.  Physical Exam:   Appearance - wearing oxygen  ENMT - no sinus tenderness, no oral exudate, no LAN, Mallampati 2 airway, no stridor  Respiratory - decreased breath sounds, no wheeze  CV - s1s2 regular rate and rhythm, no murmurs  Ext - Rt leg in a brace, no clubbing, no edema  Skin - no rashes  Psych - normal mood and affect        Pulmonary testing:  Spirometry 01/10/09 >> FEV1 0.97(52%), FEV1% 47 March 2012 >> Daliresp  started RAST 05/04/17 >> negative, IgE 8 Spirometry 05/14/17 >> FEV1 0.91 (55%), FEV1% 48 PFT 10/29/18 >> FEV1 1.05 (68%), FEV1% 52, TLC 4.29 (96%), DLCO 44%  Sleep Tests:  PSG 11/14/11 >> AHI 0.3  ONO with RA 07/31/16 >> test time 6 hrs 41 min.  Average SpO2 87%, low SpO2 74%.  Spent 4 hrs 13 min with SpO2 < 88%. ONO with 2 liters 07/24/21 >> test time 3 hrs 58 min.  Baseline SpO2 97%, low SpO2 84%.  Spent 19 sec with SpO2 < 88%.  Cardiac Tests:  Echo 06/21/21 >> EF 60 to 65%, grade 1 DD, mild elevation in PASP, mild LA dilation  Social History:  She  reports that she quit smoking about 32 years ago. Her smoking use included cigarettes. She started smoking about 66 years ago. She has a 51 pack-year smoking history. She has never used smokeless tobacco. She reports current alcohol use. She reports that she does not use drugs.  Family History:  Her family history includes Cancer in her maternal aunt and paternal grandfather; Cirrhosis in her father; Diabetes in her daughter; Goiter in her mother; Hernia in her paternal grandmother; Hypertension in her paternal grandmother; Psoriasis in her father; Stroke in her maternal grandfather and paternal grandmother.     Assessment/Plan:   GOLD 4 COPD with chronic bronchitis. - continue trelegy 100 one puff daily, daliresp 500 mg daily - prn albuterol, flutter valve, mucinex - she has a nebulizer  Long COVID. - from September  2023 - symptoms of fatigue, dyspnea and brain fog persist  Allergic rhinitis with upper airway cough. - prn zyrtec, azelastine, flonase  Laryngopharyngeal reflux. - she is to follow up with GI   Chronic respiratory failure with hypoxia. - goal SpO2 > 90% - using 2 liters at rest and 4 liters with exertion since she had Influenza  Time Spent Involved in Patient Care on Day of Examination:  35 minutes  Follow up:   Patient Instructions  You can call [phone] in October to schedule a follow up  appointment  Medication List:   Allergies as of 08/15/2022       Reactions   Losartan Other (See Comments)   hyperkalemia   Penicillins Hives, Swelling, Rash   Has patient had a PCN reaction causing immediate rash, facial/tongue/throat swelling, SOB or lightheadedness with hypotension: YES Has patient had a PCN reaction causing severe rash involving mucus membranes or skin necrosis: NO Has patient had a PCN reaction that required hospitalization NO Has patient had a PCN reaction occurring within the last 10 years: NO If all of the above answers are "NO", then may proceed with Cephalosporin use.   Cefixime [kdc:cefixime] Other (See Comments)   unspecified   Gabapentin Other (See Comments)   Feels woozy   Indocin [indomethacin] Other (See Comments)   Painful tongue and throat   Singulair [montelukast Sodium]    Chest tightness, decreased mental clarity   Sulfa Antibiotics Other (See Comments)   unspecified   Ace Inhibitors Other (See Comments)   cough   Statins Other (See Comments)   Myalgias        Medication List        Accurate as of August 15, 2022 12:28 PM. If you have any questions, ask your nurse or doctor.          STOP taking these medications    aspirin EC 81 MG tablet Stopped by: Coralyn Helling   ciprofloxacin 0.3 % ophthalmic solution Commonly known as: Ciloxan Stopped by: Coralyn Helling       TAKE these medications    albuterol (2.5 MG/3ML) 0.083% nebulizer solution Commonly known as: PROVENTIL Take 3 mLs (2.5 mg total) by nebulization every 6 (six) hours as needed for wheezing or shortness of breath.   albuterol 108 (90 Base) MCG/ACT inhaler Commonly known as: ProAir HFA Inhale 2 puffs into the lungs every 4 (four) hours as needed for wheezing or shortness of breath.   alendronate 70 MG tablet Commonly known as: FOSAMAX TAKE 1 TABLET(70 MG) BY MOUTH 1 TIME A WEEK WITH A FULL GLASS OF WATER AND ON AN EMPTY STOMACH   azelastine 0.1 % nasal  spray Commonly known as: ASTELIN Place 2 sprays into both nostrils 2 (two) times daily. Use in each nostril as directed   cetirizine 10 MG tablet Commonly known as: ZYRTEC Take 1 tablet (10 mg total) by mouth daily.   clobetasol 0.05 % external solution Commonly known as: TEMOVATE APPLY TO THE AFFECTED AREA EVERY DAY   diazepam 5 MG tablet Commonly known as: VALIUM TAKE 1 TABLET BY MOUTH DAILY AS NEEDED FOR ANXIETY OR INSOMNIA   fluticasone 50 MCG/ACT nasal spray Commonly known as: FLONASE Place 2 sprays into both nostrils daily.   furosemide 20 MG tablet Commonly known as: LASIX 1 tab po twice a day as needed for LE swelling   levothyroxine 100 MCG tablet Commonly known as: SYNTHROID Take 1/2 of the 100 microgram tab every other  day.   ondansetron 4 MG disintegrating tablet Commonly known as: ZOFRAN-ODT Take 1 tablet (4 mg total) by mouth every 8 (eight) hours as needed for vomiting or nausea.   pantoprazole 40 MG tablet Commonly known as: PROTONIX TAKE 1 TABLET(40 MG) BY MOUTH TWICE DAILY   Pulse Oximeter Misc Check oxygen level as needed   RESTASIS OP Place 1 drop into both eyes daily as needed (dry eyes).   roflumilast 500 MCG Tabs tablet Commonly known as: DALIRESP Take 1 tablet (500 mcg total) by mouth daily.   Soothe XP Soln Place 1 drop into both eyes 2 (two) times daily as needed (irritation).   spironolactone 25 MG tablet Commonly known as: ALDACTONE TAKE 1 AND 1/2 TABLETS BY MOUTH TWICE DAILY   Trelegy Ellipta 100-62.5-25 MCG/ACT Aepb Generic drug: Fluticasone-Umeclidin-Vilant Inhale 1 puff into the lungs daily.   venlafaxine 50 MG tablet Commonly known as: EFFEXOR Take 1 tablet (50 mg total) by mouth 2 (two) times daily.   vitamin B-12 100 MCG tablet Commonly known as: CYANOCOBALAMIN Take 100 mcg by mouth daily.   VITAMIN D PO Take 1 tablet by mouth daily.   Xiidra 5 % Soln Generic drug: Lifitegrast Place 1 drop into both eyes in the  morning and at bedtime.        Signature:  Coralyn Helling, MD [facility] Pager - [phone] 08/15/2022, 12:28 PM

## 2022-08-19 ENCOUNTER — Encounter: Payer: Medicare PPO | Admitting: Physical Therapy

## 2022-08-27 ENCOUNTER — Ambulatory Visit: Payer: PRIVATE HEALTH INSURANCE | Admitting: Family Medicine

## 2022-08-28 ENCOUNTER — Ambulatory Visit: Payer: Medicare PPO | Admitting: Family Medicine

## 2022-08-29 DIAGNOSIS — Z96641 Presence of right artificial hip joint: Secondary | ICD-10-CM | POA: Diagnosis not present

## 2022-08-29 DIAGNOSIS — J449 Chronic obstructive pulmonary disease, unspecified: Secondary | ICD-10-CM | POA: Diagnosis not present

## 2022-08-29 DIAGNOSIS — J984 Other disorders of lung: Secondary | ICD-10-CM | POA: Diagnosis not present

## 2022-08-29 DIAGNOSIS — R0902 Hypoxemia: Secondary | ICD-10-CM | POA: Diagnosis not present

## 2022-08-31 ENCOUNTER — Other Ambulatory Visit: Payer: Self-pay | Admitting: Family Medicine

## 2022-09-03 DIAGNOSIS — L821 Other seborrheic keratosis: Secondary | ICD-10-CM | POA: Diagnosis not present

## 2022-09-03 DIAGNOSIS — L650 Telogen effluvium: Secondary | ICD-10-CM | POA: Diagnosis not present

## 2022-09-11 ENCOUNTER — Ambulatory Visit: Payer: Medicare PPO | Admitting: Family Medicine

## 2022-09-11 ENCOUNTER — Encounter: Payer: Self-pay | Admitting: Family Medicine

## 2022-09-11 VITALS — BP 111/70 | HR 80 | Wt 118.0 lb

## 2022-09-11 DIAGNOSIS — I10 Essential (primary) hypertension: Secondary | ICD-10-CM | POA: Diagnosis not present

## 2022-09-11 DIAGNOSIS — L659 Nonscarring hair loss, unspecified: Secondary | ICD-10-CM | POA: Diagnosis not present

## 2022-09-11 DIAGNOSIS — E039 Hypothyroidism, unspecified: Secondary | ICD-10-CM

## 2022-09-11 DIAGNOSIS — N3001 Acute cystitis with hematuria: Secondary | ICD-10-CM

## 2022-09-11 DIAGNOSIS — E538 Deficiency of other specified B group vitamins: Secondary | ICD-10-CM

## 2022-09-11 DIAGNOSIS — G3184 Mild cognitive impairment, so stated: Secondary | ICD-10-CM | POA: Diagnosis not present

## 2022-09-11 DIAGNOSIS — T50905A Adverse effect of unspecified drugs, medicaments and biological substances, initial encounter: Secondary | ICD-10-CM | POA: Diagnosis not present

## 2022-09-11 DIAGNOSIS — R300 Dysuria: Secondary | ICD-10-CM | POA: Diagnosis not present

## 2022-09-11 DIAGNOSIS — N390 Urinary tract infection, site not specified: Secondary | ICD-10-CM | POA: Diagnosis not present

## 2022-09-11 LAB — POCT URINALYSIS DIPSTICK
Bilirubin, UA: NEGATIVE
Blood, UA: POSITIVE
Glucose, UA: NEGATIVE
Ketones, UA: NEGATIVE
Nitrite, UA: NEGATIVE
Protein, UA: POSITIVE — AB
Spec Grav, UA: 1.02 (ref 1.010–1.025)
Urobilinogen, UA: 1 E.U./dL
pH, UA: 6 (ref 5.0–8.0)

## 2022-09-11 LAB — TSH: TSH: 0.1 u[IU]/mL — ABNORMAL LOW (ref 0.35–5.50)

## 2022-09-11 LAB — VITAMIN B12: Vitamin B-12: 528 pg/mL (ref 211–911)

## 2022-09-11 LAB — T4, FREE: Free T4: 1.26 ng/dL (ref 0.60–1.60)

## 2022-09-11 MED ORDER — VENLAFAXINE HCL 50 MG PO TABS: 50.0000 mg | ORAL_TABLET | Freq: Two times a day (BID) | ORAL | 1 refills | Status: DC

## 2022-09-11 MED ORDER — DIAZEPAM 5 MG PO TABS: ORAL_TABLET | ORAL | Status: DC

## 2022-09-11 MED ORDER — CIPROFLOXACIN HCL 500 MG PO TABS: 500.0000 mg | ORAL_TABLET | Freq: Two times a day (BID) | ORAL | 0 refills | Status: AC

## 2022-09-11 NOTE — Progress Notes (Signed)
OFFICE VISIT  09/11/2022  CC:  Chief Complaint  Patient presents with   Hair/Scalp Problem    Started about 2 months ago; Saw Dr. Margo Aye and was prescribed drops for hair growth, pt states they seem to be working. Pt denies any kind of dietary changes or medication that could have caused hair loss.     Patient is a 87 y.o. female who presents accompanied by her boyfriend Fayrene Fearing for hair loss.  HPI: Hair loss continues/worsens. She saw dermatologist, Dr. Margo Aye, and was prescribed some hair drops to help Hair growth--patient does not remember what the name is.  She thinks she is starting to see some new hair growth.  We have been working on getting her thyroid level corrects over the last 6 months.  Her labs have indicated over suppression. Most recent TSH was 0.02 on 07/31/2022.  At that time I recommended she decrease dose to 1/2 of a 100 mcg tab every other day.  She reports today she is actually taking one half tab every day.  Memory impairment with periodic cognitive dysfunction persists. Sundowning. Emotional.  No lower extremity swelling.  No use of Lasix recently. Breathing feels like it is at baseline, 2 L nasal cannula 24/7.  Last several days she has felt burning with urination and urinary frequency and urgency. She took Azo for couple days and this did help the burning.  She has not had Azo in the last day or 2 though.  ROS as above, plus--> no fevers, no CP, no dizziness, no HAs, no rashes, no melena/hematochezia.  No polyuria or polydipsia.  No myalgias or arthralgias.  No focal weakness, paresthesias, or tremors.  No acute vision or hearing abnormalities.  No n/v/d or abd pain.  No palpitations.    Past Medical History:  Diagnosis Date   Allergic rhinitis    with upper airway cough: as of 07/2017 pulm f/u she was instructed to use astelin, flonase, and saline more consistently   BPPV (benign paroxysmal positional vertigo) 03/2018   C. difficile colitis    Chest pain,  non-cardiac    Cardiac CT showed no signif obstructive dz, mildly elevated calcium level (Dr. Jens Som).   Chronic cystitis with hematuria    Dr. Lenon Curt   Chronic hypoxemic respiratory failure (HCC) 02/02/2015   2L oxygen 24/7   Chronic renal insufficiency, stage 2 (mild)    GFR approx 60   Cold hands    NCS/EMGs normal.  Hand dysesthesias per neuro--reassured   COPD (chronic obstructive pulmonary disease) (HCC)    GOLD 4.  spirometry 01/10/09 FEV 0.97(52%), FEV1% 47.  With chronic bronchitis as of 07/2017.   DDD (degenerative disc disease), cervical    1998 MRI C5-6 impingemt (left).  MRI 2021 (neurol) 1 level canal stenosis, 1 level foraminal stenosis--->PT   DDD (degenerative disc disease), lumbar    Depression with anxiety 04/15/2011   Diarrheal disease Summer 2017   ? 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 Dr Kinnie Scales).  As of 10/05/15 pt reports GI dx'd her with C diff and rx'd more flagyl and she is also on cholestyramine.  As of 02/2016, pt's sx's resolved completely.   Diverticulosis of colon    Procto '97 and colonoscopy 2002   Dysplastic nevus of upper extremity 04/2014   R tricep (Dr. Jorja Loa)   Edema of both lower extremities due to peripheral venous insufficiency    Varicosities bilat, swelling L>R.  R  baker's cyst.  Got vein clinic eval 02/2018->sclerotherapy recommended but since no hemorrhage or ulceration, insurance will not cover the procedure.   Family history of adverse reaction to anesthesia    mother died when patient age 24 receiving anesthesia for thyroid surgery   Fibrocystic breast disease    w/fibroadenoma.  Bx's showed NO atypia (Dr. Jamey Ripa).  Mammo neg 2008.   GERD (gastroesophageal reflux disease)    History of double vision    Ophthalmologist, Dr. Elmer Picker, is further evaluating this with MRI  orbits and limited brain (myesthenia gravis testing neg 07/2010)    History of home oxygen therapy    uses 2 liters at hs   Hypertension    EKG 03/2010 normal   Hypothyroidism    Hashimoto's, dx'd 1989   Idiopathic peripheral neuropathy 04/2018   Sensory (numb feet) S1 distribution bilat, c/w spinal stenosis sensory neuropathy (no pain). 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  Labs [place], MRI C spine->some spondylosis/stenosis.  UE NCS/EMS: normal 04/2019.   Lesion of right native kidney 2017   found on CT 02/2015.  F/u MRI 03/31/15 showed it to be smaller and less likely of any significance but repeat CT renal protocol in 40mo was recommended by radiology.  This was done 10/26/15 and showed no interval change in the lesion, but b/c the lesion enhances with contrast, radiol rec'd urol consult (b/c pap renal RCC could not be excluded).  Saw urol 03/06/16--stable on u/s 09/2016 and 09/2017.   OAB (overactive bladder)    Osteoarthritis, hip, bilateral    she is s/p bilat THA as of 02/2018.   Osteoporosis    radius, T score -2.5->rec'd alendronate 08/2019->plan rpt DEXA 08/2021.   Pneumonia    Postmenopausal atrophic vaginitis    improved with estrace 2 X/week.   Prolapse of vaginal cuff after hysterectomy    10/2018: Dr. Katherina Right. Mild colpocele 04/2019 per GYN (no surgery)   Pulmonary nodule    CT chest 12/10, June 2011.  No nodule on CT 06/2010.   Recurrent Clostridium difficile diarrhea    Episode 09/19/2016: resolved with prolonged course of flagyl.  11/2016 recurrence treated with 10d of Dificid (Fidaxomicin) by GI (Dr. Kinnie Scales).   Recurrent UTI    likely related to pt's atrophic vaginitis.  Urol started vaginal estrace 03/2016.   Rib fracture 09/2018   R 7th, laterally-->sustained when her dog pulled her over and she fell.   RLS (restless legs syndrome)    neuro->gabapentin trial 2020/21   Shingles 02/2014   R abd/flank   Spinal stenosis of lumbar region with neurogenic claudication    2008 MRI (L3-4,  L4-5, L5-S1).  +S1 distribution sensory loss bilat    Past Surgical History:  Procedure Laterality Date   ABDOMINAL HYSTERECTOMY  1978   no BSO per pt--nonmalignant reasons   APPENDECTOMY     BREAST CYST EXCISION     Last screening mammogram 10/2010 was normal.   CHOLECYSTECTOMY  2001   COLONOSCOPY  04/2005; 12/06/2015   2007 NORMAL.  2017 adenomatous polyp x 1.  Mod sigmoid diverticulosis (Dr. Kinnie Scales).     DEXA  05/18/2017; 08/7019   2019 NORMAL (T-score 0.1).  08/2019->T score -2.5->alendronate started->plan Rpt 08/2021.  03/2022 T score -2.7   HIP CLOSED REDUCTION Right 12/25/2015   Procedure: CLOSED REDUCTION HIP;  Surgeon: Yolonda Kida, MD;  Location: [facility];  Service: Orthopedics;  Laterality: Right;   NCV/EMS  10/2018   chronic and symmetric sensorimotor  axonal polyneuropathy affecting the lower extremities   TONSILLECTOMY     TOTAL HIP ARTHROPLASTY Right 11/01/2014   Procedure: RIGHT TOTAL HIP ARTHROPLASTY;  Surgeon: Ranee Gosselin, MD;  Location: [facility];  Service: Orthopedics;  Laterality: Right;   TOTAL HIP ARTHROPLASTY Left 12/08/2017   Procedure: LEFT TOTAL HIP ARTHROPLASTY ANTERIOR APPROACH;  Surgeon: Durene Romans, MD;  Location: [facility];  Service: Orthopedics;  Laterality: Left;  70 mins   TRANSTHORACIC ECHOCARDIOGRAM  08/2014   2016 EF 60-65%, grade I diast dysfxn. 06/21/21 unchanged.   TUBAL LIGATION  1974    Outpatient Medications Prior to Visit  Medication Sig Dispense Refill   albuterol (PROAIR HFA) 108 (90 Base) MCG/ACT inhaler Inhale 2 puffs into the lungs every 4 (four) hours as needed for wheezing or shortness of breath. 8.5 g 1   albuterol (PROVENTIL) (2.5 MG/3ML) 0.083% nebulizer solution Take 3 mLs (2.5 mg total) by nebulization every 6 (six) hours as needed for wheezing or shortness of breath. 360 mL 5   alendronate (FOSAMAX) 70 MG tablet TAKE 1 TABLET(70 MG) BY MOUTH 1 TIME A WEEK WITH A FULL GLASS OF WATER AND ON AN EMPTY STOMACH 12 tablet 3   Artificial  Tear Solution (SOOTHE XP) SOLN Place 1 drop into both eyes 2 (two) times daily as needed (irritation).     azelastine (ASTELIN) 0.1 % nasal spray Place 2 sprays into both nostrils 2 (two) times daily. Use in each nostril as directed 30 mL 3   cetirizine (ZYRTEC) 10 MG tablet Take 1 tablet (10 mg total) by mouth daily. 30 tablet 6   clobetasol (TEMOVATE) 0.05 % external solution APPLY TO THE AFFECTED AREA EVERY DAY 50 mL 4   cycloSPORINE (RESTASIS OP) Place 1 drop into both eyes daily as needed (dry eyes).     diazepam (VALIUM) 5 MG tablet TAKE 1 TABLET BY MOUTH DAILY AS NEEDED FOR ANXIETY OR INSOMNIA 30 tablet 3   fluticasone (FLONASE) 50 MCG/ACT nasal spray Place 2 sprays into both nostrils daily. 16 g 6   Fluticasone-Umeclidin-Vilant (TRELEGY ELLIPTA) 100-62.5-25 MCG/ACT AEPB Inhale 1 puff into the lungs daily. 180 each 3   furosemide (LASIX) 20 MG tablet 1 tab po twice a day as needed for LE swelling 60 tablet 1   levothyroxine (SYNTHROID) 100 MCG tablet Take 1/2 of the 100 microgram tab every other day. 90 tablet 1   Lifitegrast (XIIDRA) 5 % SOLN Place 1 drop into both eyes in the morning and at bedtime.     Misc. Devices (PULSE OXIMETER) MISC Check oxygen level as needed 1 each 1   ondansetron (ZOFRAN-ODT) 4 MG disintegrating tablet Take 1 tablet (4 mg total) by mouth every 8 (eight) hours as needed for vomiting or nausea. 20 tablet 0   pantoprazole (PROTONIX) 40 MG tablet TAKE 1 TABLET(40 MG) BY MOUTH TWICE DAILY 180 tablet 0   roflumilast (DALIRESP) 500 MCG TABS tablet Take 1 tablet (500 mcg total) by mouth daily. 90 tablet 3   spironolactone (ALDACTONE) 25 MG tablet TAKE 1 AND 1/2 TABLETS BY MOUTH TWICE DAILY 270 tablet 0   venlafaxine (EFFEXOR) 50 MG tablet Take 1 tablet (50 mg total) by mouth 2 (two) times daily. 180 tablet 1   vitamin B-12 (CYANOCOBALAMIN) 100 MCG tablet Take 100 mcg by mouth daily.     VITAMIN D PO Take 1 tablet by mouth daily.     No facility-administered medications  prior to visit.    Allergies  Allergen Reactions  Losartan Other (See Comments)    hyperkalemia   Penicillins Hives, Swelling and Rash    Has patient had a PCN reaction causing immediate rash, facial/tongue/throat swelling, SOB or lightheadedness with hypotension: YES Has patient had a PCN reaction causing severe rash involving mucus membranes or skin necrosis: NO Has patient had a PCN reaction that required hospitalization NO Has patient had a PCN reaction occurring within the last 10 years: NO If all of the above answers are "NO", then may proceed with Cephalosporin use.    Cefixime [Kdc:Cefixime] Other (See Comments)    unspecified   Gabapentin Other (See Comments)    Feels woozy   Indocin [Indomethacin] Other (See Comments)    Painful tongue and throat   Singulair [Montelukast Sodium]     Chest tightness, decreased mental clarity   Sulfa Antibiotics Other (See Comments)    unspecified   Ace Inhibitors Other (See Comments)    cough   Statins Other (See Comments)    Myalgias     Review of Systems  As per HPI  PE:    09/11/2022    1:07 PM 08/15/2022   11:40 AM 07/31/2022    2:54 PM  Vitals with BMI  Height  5\' 0"  5\' 0"   Weight 118 lbs 116 lbs 3 oz 115 lbs 3 oz  BMI  22.69 22.5  Systolic 111 138 960  Diastolic 70 72 75  Pulse 80 76 73     Physical Exam  Gen: Alert, well appearing.  Patient is oriented to person, place, time, and situation. AFFECT: pleasant, lucid thought and speech. Moderate to severe diffuse alopecia on frontal region diffusely extending back to the vertex. CV: RRR, no m/r/g LUNGS: Clear to auscultation, mild diminished aeration diffusely.  No wheezing.  No labored breathing. EXT: no clubbing or cyanosis.  no edema.    LABS:  Last CBC Lab Results  Component Value Date   WBC 11.9 (H) 05/17/2022   HGB 11.8 (L) 05/17/2022   HCT 37.7 05/17/2022   MCV 90.0 05/17/2022   MCH 28.2 05/17/2022   RDW 14.8 05/17/2022   PLT 259 05/17/2022    Last metabolic panel Lab Results  Component Value Date   GLUCOSE 100 (H) 05/17/2022   NA 142 05/17/2022   K 3.5 05/17/2022   CL 103 05/17/2022   CO2 30 05/17/2022   BUN 15 05/17/2022   CREATININE 0.81 05/17/2022   GFRNONAA >60 05/17/2022   CALCIUM 9.4 05/17/2022   PHOS 3.8 01/31/2013   PROT 6.1 (L) 10/29/2021   ALBUMIN 3.1 (L) 10/29/2021   BILITOT 0.4 10/29/2021   ALKPHOS 58 10/29/2021   AST 18 10/29/2021   ALT 42 10/29/2021   ANIONGAP 9 05/17/2022   Last thyroid functions Lab Results  Component Value Date   TSH 0.02 (L) 07/31/2022   T3TOTAL 107 08/04/2022   T4TOTAL 12.4 07/11/2010   Lab Results  Component Value Date   VITAMINB12 314 10/12/2018    IMPRESSION AND PLAN:  #1 alopecia. Suspect stress induced as well as possible side effect from spironolactone (we started this back in the spring 2023 and have gradually increased the dose to 37.5 mg twice a day). Will discontinue spironolactone.  She will continue the drops Dr. Margo Aye prescribed her (clobetasol). Check iron and B12 levels today.  #2 hypothyroidism. Has been over suppressed lately. Most recent TSH was 0.02 on 07/31/2022.  At that time I recommended she decrease dose to 1/2 of a 100 mcg tab every other day.  She reports today she is actually taking one half tab every day. Check thyroid panel today.  #3 recurrent urinary tract infection. Urinalysis today showed trace blood and 3+ leukocytes. Cipro 500 mg twice daily x 3 days prescribed today. Send for culture.  4.  Memory loss with mild cognitive impairment. Gradually worsening. Will gradually get her off her Valium.  She has been on this for several decades.  Decrease to one half of a 5 mg tab nightly for the next month.  #5 hypertension.  Will see how her blood pressure does off of spironolactone.  An After Visit Summary was printed and given to the patient.  FOLLOW UP: No follow-ups on file.  Signed:  Santiago Bumpers, MD           09/11/2022

## 2022-09-12 DIAGNOSIS — R0902 Hypoxemia: Secondary | ICD-10-CM | POA: Diagnosis not present

## 2022-09-12 DIAGNOSIS — J984 Other disorders of lung: Secondary | ICD-10-CM | POA: Diagnosis not present

## 2022-09-12 DIAGNOSIS — Z96641 Presence of right artificial hip joint: Secondary | ICD-10-CM | POA: Diagnosis not present

## 2022-09-12 DIAGNOSIS — J449 Chronic obstructive pulmonary disease, unspecified: Secondary | ICD-10-CM | POA: Diagnosis not present

## 2022-09-12 NOTE — Progress Notes (Signed)
Pt given results/recommendations.

## 2022-09-15 ENCOUNTER — Ambulatory Visit: Payer: Medicare PPO | Admitting: Family Medicine

## 2022-09-15 ENCOUNTER — Other Ambulatory Visit: Payer: Medicare PPO

## 2022-09-18 ENCOUNTER — Encounter (INDEPENDENT_AMBULATORY_CARE_PROVIDER_SITE_OTHER): Payer: Self-pay

## 2022-09-19 ENCOUNTER — Other Ambulatory Visit: Payer: Self-pay | Admitting: Family Medicine

## 2022-09-23 NOTE — Patient Instructions (Incomplete)
 It was very nice to see you today!   PLEASE NOTE:  If labs were collected or images ordered, we will inform you of  results once we have received them and reviewed. We will contact you either by echart message, or telephone call.     Please give ample time to the testing facility, and our office to run, receive and review results. Please do not call inquiring of results, even if you can see them in your chart. We will contact you as soon as we are able. If it has been over 1 week since the test was completed, and you have not yet heard from [place], then please call us.   If we ordered any referrals today, please let us know if you have not heard from their office within the next 2 weeks. You should receive a letter via MyChart confirming if the referral was approved and their office contact information to schedule.

## 2022-09-25 ENCOUNTER — Encounter: Payer: Self-pay | Admitting: Family Medicine

## 2022-09-25 ENCOUNTER — Ambulatory Visit (INDEPENDENT_AMBULATORY_CARE_PROVIDER_SITE_OTHER): Payer: Medicare PPO | Admitting: Family Medicine

## 2022-09-25 VITALS — BP 126/66 | HR 65 | Temp 98.1°F | Wt 119.6 lb

## 2022-09-25 DIAGNOSIS — F03A18 Unspecified dementia, mild, with other behavioral disturbance: Secondary | ICD-10-CM

## 2022-09-25 DIAGNOSIS — I10 Essential (primary) hypertension: Secondary | ICD-10-CM | POA: Diagnosis not present

## 2022-09-25 DIAGNOSIS — L659 Nonscarring hair loss, unspecified: Secondary | ICD-10-CM

## 2022-09-25 DIAGNOSIS — E039 Hypothyroidism, unspecified: Secondary | ICD-10-CM

## 2022-09-25 MED ORDER — LIDOCAINE VISCOUS HCL 2 % MT SOLN: OROMUCOSAL | 2 refills | Status: DC

## 2022-09-25 MED ORDER — DIAZEPAM 2 MG PO TABS: ORAL_TABLET | ORAL | 0 refills | Status: DC

## 2022-09-25 NOTE — Progress Notes (Signed)
OFFICE VISIT  09/25/2022  CC:  Chief Complaint  Patient presents with   Blood Pressure    F/u.    Patient is a 87 y.o. female who presents accompanied by  her friend Fayrene Fearing for 2-week follow-up blood pressure and alopecia. A/P as of last visit: "#1 alopecia. Suspect stress induced as well as possible side effect from spironolactone (we started this back in the spring 2023 and have gradually increased the dose to 37.5 mg twice a day). Will discontinue spironolactone.  She will continue the drops Dr. Margo Aye prescribed her (clobetasol). Check iron and B12 levels today.   #2 hypothyroidism. Has been over suppressed lately. Most recent TSH was 0.02 on 07/31/2022.  At that time I recommended she decrease dose to 1/2 of a 100 mcg tab every other day.  She reports today she is actually taking one half tab every day. Check thyroid panel today.   #3 recurrent urinary tract infection. Urinalysis today showed trace blood and 3+ leukocytes. Cipro 500 mg twice daily x 3 days prescribed today. Send for culture.   4.  Memory loss with mild cognitive impairment. Gradually worsening. Will gradually get her off her Valium.  She has been on this for several decades.  Decrease to one half of a 5 mg tab nightly for the next month.   #5 hypertension.  Will see how her blood pressure does off of spironolactone."  INTERIM HX: Her hair is growing back some!. Still having sundowning around 5 to 6 PM nightly. Mornings and daytime she seems to have good memory and cognition.  No problems with decreasing diazepam to half of the 5 mg tab nightly. Review of med list reveals that she has not been taking venlafaxine, unclear how long.  Mood and anxiety levels good.  There was miscommunication and after the last low TSH in June she did not get the instructions to change her dosing of levothyroxine 100 mcg to one half tab every other day.  She has been taking a half tab every day.  Her lower dentures do not  fit very well anymore.  She has pain in an area of the inner cheek due to them rubbing.  Hurts to eat.  She is gargling salt water and feels like this helps some  ROS as above, plus--> no fevers, no CP, no SOB, no wheezing, no cough, no dizziness, no HAs, no rashes, no melena/hematochezia.  No polyuria or polydipsia.  No myalgias or arthralgias.  No focal weakness, paresthesias, or tremors.  No acute vision or hearing abnormalities.  No dysuria.  She has chronic urinary frequency during the daytime.  None at night. No swelling in the legs. No n/v/d or abd pain.  No palpitations.      Past Medical History:  Diagnosis Date   Allergic rhinitis    with upper airway cough: as of 07/2017 pulm f/u she was instructed to use astelin, flonase, and saline more consistently   BPPV (benign paroxysmal positional vertigo) 03/2018   C. difficile colitis    Chest pain, non-cardiac    Cardiac CT showed no signif obstructive dz, mildly elevated calcium level (Dr. Jens Som).   Chronic cystitis with hematuria    Dr. Lenon Curt   Chronic hypoxemic respiratory failure (HCC) 02/02/2015   2L oxygen 24/7   Chronic renal insufficiency, stage 2 (mild)    GFR approx 60   Cold hands    NCS/EMGs normal.  Hand dysesthesias per neuro--reassured   COPD (chronic obstructive pulmonary disease) (HCC)  GOLD 4.  spirometry 01/10/09 FEV 0.97(52%), FEV1% 47.  With chronic bronchitis as of 07/2017.   DDD (degenerative disc disease), cervical    1998 MRI C5-6 impingemt (left).  MRI 2021 (neurol) 1 level canal stenosis, 1 level foraminal stenosis--->PT   DDD (degenerative disc disease), lumbar    Depression with anxiety 04/15/2011   Diarrheal disease Summer 2017   ? 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 Dr Kinnie Scales).  As of 10/05/15 pt reports GI dx'd her with C diff and rx'd more flagyl and she is also on cholestyramine.  As of  02/2016, pt's sx's resolved completely.   Diverticulosis of colon    Procto '97 and colonoscopy 2002   Dysplastic nevus of upper extremity 04/2014   R tricep (Dr. Jorja Loa)   Edema of both lower extremities due to peripheral venous insufficiency    Varicosities bilat, swelling L>R.  R baker's cyst.  Got vein clinic eval 02/2018->sclerotherapy recommended but since no hemorrhage or ulceration, insurance will not cover the procedure.   Family history of adverse reaction to anesthesia    mother died when patient age 11 receiving anesthesia for thyroid surgery   Fibrocystic breast disease    w/fibroadenoma.  Bx's showed NO atypia (Dr. Jamey Ripa).  Mammo neg 2008.   GERD (gastroesophageal reflux disease)    History of double vision    Ophthalmologist, Dr. Elmer Picker, is further evaluating this with MRI  orbits and limited brain (myesthenia gravis testing neg 07/2010)   History of home oxygen therapy    uses 2 liters at hs   Hypertension    EKG 03/2010 normal   Hypothyroidism    Hashimoto's, dx'd 1989   Idiopathic peripheral neuropathy 04/2018   Sensory (numb feet) S1 distribution bilat, c/w spinal stenosis sensory neuropathy (no pain). 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  Labs [place], MRI C spine->some spondylosis/stenosis.  UE NCS/EMS: normal 04/2019.   Lesion of right native kidney 2017   found on CT 02/2015.  F/u MRI 03/31/15 showed it to be smaller and less likely of any significance but repeat CT renal protocol in 23mo was recommended by radiology.  This was done 10/26/15 and showed no interval change in the lesion, but b/c the lesion enhances with contrast, radiol rec'd urol consult (b/c pap renal RCC could not be excluded).  Saw urol 03/06/16--stable on u/s 09/2016 and 09/2017.   OAB (overactive bladder)    Osteoarthritis, hip, bilateral    she is s/p bilat THA as of 02/2018.   Osteoporosis    radius, T score -2.5->rec'd alendronate  08/2019->plan rpt DEXA 08/2021.   Pneumonia    Postmenopausal atrophic vaginitis    improved with estrace 2 X/week.   Prolapse of vaginal cuff after hysterectomy    10/2018: Dr. Katherina Right. Mild colpocele 04/2019 per GYN (no surgery)   Pulmonary nodule    CT chest 12/10, June 2011.  No nodule on CT 06/2010.   Recurrent Clostridium difficile diarrhea    Episode 09/19/2016: resolved with prolonged course of flagyl.  11/2016 recurrence treated with 10d of Dificid (Fidaxomicin) by GI (Dr. Kinnie Scales).   Recurrent UTI    likely related to pt's atrophic vaginitis.  Urol started vaginal estrace 03/2016.   Rib fracture 09/2018   R 7th, laterally-->sustained when her dog pulled her over and she fell.   RLS (restless legs syndrome)    neuro->gabapentin trial 2020/21   Shingles 02/2014  R abd/flank   Spinal stenosis of lumbar region with neurogenic claudication    2008 MRI (L3-4, L4-5, L5-S1).  +S1 distribution sensory loss bilat    Past Surgical History:  Procedure Laterality Date   ABDOMINAL HYSTERECTOMY  1978   no BSO per pt--nonmalignant reasons   APPENDECTOMY     BREAST CYST EXCISION     Last screening mammogram 10/2010 was normal.   CHOLECYSTECTOMY  2001   COLONOSCOPY  04/2005; 12/06/2015   2007 NORMAL.  2017 adenomatous polyp x 1.  Mod sigmoid diverticulosis (Dr. Kinnie Scales).     DEXA  05/18/2017; 08/7019   2019 NORMAL (T-score 0.1).  08/2019->T score -2.5->alendronate started->plan Rpt 08/2021.  03/2022 T score -2.7   HIP CLOSED REDUCTION Right 12/25/2015   Procedure: CLOSED REDUCTION HIP;  Surgeon: Yolonda Kida, MD;  Location: [facility];  Service: Orthopedics;  Laterality: Right;   NCV/EMS  10/2018   chronic and symmetric sensorimotor axonal polyneuropathy affecting the lower extremities   TONSILLECTOMY     TOTAL HIP ARTHROPLASTY Right 11/01/2014   Procedure: RIGHT TOTAL HIP ARTHROPLASTY;  Surgeon: Ranee Gosselin, MD;  Location: [facility];  Service: Orthopedics;  Laterality: Right;   TOTAL HIP  ARTHROPLASTY Left 12/08/2017   Procedure: LEFT TOTAL HIP ARTHROPLASTY ANTERIOR APPROACH;  Surgeon: Durene Romans, MD;  Location: [facility];  Service: Orthopedics;  Laterality: Left;  70 mins   TRANSTHORACIC ECHOCARDIOGRAM  08/2014   2016 EF 60-65%, grade I diast dysfxn. 06/21/21 unchanged.   TUBAL LIGATION  1974    Outpatient Medications Prior to Visit  Medication Sig Dispense Refill   albuterol (PROAIR HFA) 108 (90 Base) MCG/ACT inhaler Inhale 2 puffs into the lungs every 4 (four) hours as needed for wheezing or shortness of breath. 8.5 g 1   albuterol (PROVENTIL) (2.5 MG/3ML) 0.083% nebulizer solution Take 3 mLs (2.5 mg total) by nebulization every 6 (six) hours as needed for wheezing or shortness of breath. 360 mL 5   alendronate (FOSAMAX) 70 MG tablet TAKE 1 TABLET(70 MG) BY MOUTH 1 TIME A WEEK WITH A FULL GLASS OF WATER AND ON AN EMPTY STOMACH 12 tablet 3   Artificial Tear Solution (SOOTHE XP) SOLN Place 1 drop into both eyes 2 (two) times daily as needed (irritation).     azelastine (ASTELIN) 0.1 % nasal spray Place 2 sprays into both nostrils 2 (two) times daily. Use in each nostril as directed 30 mL 3   cetirizine (ZYRTEC) 10 MG tablet Take 1 tablet (10 mg total) by mouth daily. 30 tablet 6   clobetasol (TEMOVATE) 0.05 % external solution APPLY TO THE AFFECTED AREA EVERY DAY 50 mL 4   clobetasol cream (TEMOVATE) 0.05 % Apply 1 Application topically 2 (two) times daily. Dr. hall     cycloSPORINE (RESTASIS OP) Place 1 drop into both eyes daily as needed (dry eyes).     fluticasone (FLONASE) 50 MCG/ACT nasal spray Place 2 sprays into both nostrils daily. 16 g 6   Fluticasone-Umeclidin-Vilant (TRELEGY ELLIPTA) 100-62.5-25 MCG/ACT AEPB Inhale 1 puff into the lungs daily. 180 each 3   furosemide (LASIX) 20 MG tablet 1 tab po twice a day as needed for LE swelling 60 tablet 1   levothyroxine (SYNTHROID) 100 MCG tablet Take 1/2 of the 100 microgram tab every other day. 90 tablet 1   Lifitegrast  (XIIDRA) 5 % SOLN Place 1 drop into both eyes in the morning and at bedtime.     Misc. Devices (PULSE OXIMETER) MISC Check oxygen level  as needed 1 each 1   ondansetron (ZOFRAN-ODT) 4 MG disintegrating tablet Take 1 tablet (4 mg total) by mouth every 8 (eight) hours as needed for vomiting or nausea. 20 tablet 0   pantoprazole (PROTONIX) 40 MG tablet TAKE 1 TABLET(40 MG) BY MOUTH TWICE DAILY 180 tablet 0   roflumilast (DALIRESP) 500 MCG TABS tablet Take 1 tablet (500 mcg total) by mouth daily. 90 tablet 3   vitamin B-12 (CYANOCOBALAMIN) 100 MCG tablet Take 100 mcg by mouth daily.     VITAMIN D PO Take 1 tablet by mouth daily.     diazepam (VALIUM) 5 MG tablet 1/2 tab po at bedtime     venlafaxine (EFFEXOR) 50 MG tablet Take 1 tablet (50 mg total) by mouth 2 (two) times daily. 180 tablet 1   No facility-administered medications prior to visit.    Allergies  Allergen Reactions   Losartan Other (See Comments)    hyperkalemia   Penicillins Hives, Swelling and Rash    Has patient had a PCN reaction causing immediate rash, facial/tongue/throat swelling, SOB or lightheadedness with hypotension: YES Has patient had a PCN reaction causing severe rash involving mucus membranes or skin necrosis: NO Has patient had a PCN reaction that required hospitalization NO Has patient had a PCN reaction occurring within the last 10 years: NO If all of the above answers are "NO", then may proceed with Cephalosporin use.    Cefixime [Kdc:Cefixime] Other (See Comments)    unspecified   Gabapentin Other (See Comments)    Feels woozy   Indocin [Indomethacin] Other (See Comments)    Painful tongue and throat   Singulair [Montelukast Sodium]     Chest tightness, decreased mental clarity   Sulfa Antibiotics Other (See Comments)    unspecified   Ace Inhibitors Other (See Comments)    cough   Statins Other (See Comments)    Myalgias     Review of Systems As per HPI  PE:    09/25/2022    1:10 PM 09/11/2022     1:07 PM 08/15/2022   11:40 AM  Vitals with BMI  Height   5\' 0"   Weight 119 lbs 10 oz 118 lbs 116 lbs 3 oz  BMI   22.69  Systolic 126 111 474  Diastolic 66 70 72  Pulse 65 80 76     Physical Exam  Gen: Alert, well appearing.  Patient is oriented to person, place, time, and situation. Mouth: In the buccal mucosa just adjacent to the right inferior alveolar ridge she has a large superficial ulceration, gray color.  No surrounding erythema.  LABS:  Last metabolic panel Lab Results  Component Value Date   GLUCOSE 100 (H) 05/17/2022   NA 142 05/17/2022   K 3.5 05/17/2022   CL 103 05/17/2022   CO2 30 05/17/2022   BUN 15 05/17/2022   CREATININE 0.81 05/17/2022   GFRNONAA >60 05/17/2022   CALCIUM 9.4 05/17/2022   PHOS 3.8 01/31/2013   PROT 6.1 (L) 10/29/2021   ALBUMIN 3.1 (L) 10/29/2021   BILITOT 0.4 10/29/2021   ALKPHOS 58 10/29/2021   AST 18 10/29/2021   ALT 42 10/29/2021   ANIONGAP 9 05/17/2022   Lab Results  Component Value Date   TSH 0.10 (L) 09/11/2022   T3TOTAL 96 09/11/2022   T4TOTAL 12.4 07/11/2010   Lab Results  Component Value Date   VITAMINB12 528 09/11/2022   IMPRESSION AND PLAN:  #1 alopecia.  Suspect spironolactone was causing at least some of  this. It is improving with recent discontinuation of spironolactone as well as with use of clobetasol 0.05% external solution prescribed by her dermatologist Dr. Margo Aye.  #2 hypertension, blood pressure normal despite discontinuation of spironolactone. Currently on no antihypertensives at all.  3.  Hypothyroidism.  She is oversupplemented lately. Most recent TSH is 0.10 about 2 weeks ago. There has been some confusion as to dose adjustments lately. Reiterated today that she is to change her dos to one half of a 100 mcg tab every other day. Next TSH 6 weeks.  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 Day times are pretty good, evenings she has quite significant sundowning. Trying to make sure  polypharmacy does not contribute.  She is doing well with cutting back on her diazepam to one half of a 5 mg tab nightly.  She will continue this for a few more nights and then we will cut her up to one half of a 2 mg tab nightly for a couple of weeks before discontinuing this med completely. She was on venlafaxine long-term but has been off of this of late (??how long) a while so we will take it off her med list.  #5 oral ulcer.  Due to poorly fitting dentures. Prescribed viscous lidocaine to use 1 teaspoon prior to meals--swish and spit.  FOLLOW UP: Return in about 6 weeks (around 11/06/2022) for routine chronic illness f/u.  Signed:  Santiago Bumpers, MD           09/25/2022

## 2022-09-29 DIAGNOSIS — J984 Other disorders of lung: Secondary | ICD-10-CM | POA: Diagnosis not present

## 2022-09-29 DIAGNOSIS — R0902 Hypoxemia: Secondary | ICD-10-CM | POA: Diagnosis not present

## 2022-09-29 DIAGNOSIS — Z96641 Presence of right artificial hip joint: Secondary | ICD-10-CM | POA: Diagnosis not present

## 2022-09-29 DIAGNOSIS — J449 Chronic obstructive pulmonary disease, unspecified: Secondary | ICD-10-CM | POA: Diagnosis not present

## 2022-10-13 DIAGNOSIS — J449 Chronic obstructive pulmonary disease, unspecified: Secondary | ICD-10-CM | POA: Diagnosis not present

## 2022-10-13 DIAGNOSIS — Z96641 Presence of right artificial hip joint: Secondary | ICD-10-CM | POA: Diagnosis not present

## 2022-10-13 DIAGNOSIS — J984 Other disorders of lung: Secondary | ICD-10-CM | POA: Diagnosis not present

## 2022-10-13 DIAGNOSIS — R0902 Hypoxemia: Secondary | ICD-10-CM | POA: Diagnosis not present

## 2022-10-20 ENCOUNTER — Emergency Department (HOSPITAL_BASED_OUTPATIENT_CLINIC_OR_DEPARTMENT_OTHER)
Admission: EM | Admit: 2022-10-20 | Discharge: 2022-10-20 | Disposition: A | Discharge status: Home / Self Care | Payer: Medicare PPO | Attending: Emergency Medicine | Admitting: Emergency Medicine

## 2022-10-20 ENCOUNTER — Emergency Department (HOSPITAL_BASED_OUTPATIENT_CLINIC_OR_DEPARTMENT_OTHER): Payer: Medicare PPO

## 2022-10-20 ENCOUNTER — Other Ambulatory Visit: Payer: Self-pay

## 2022-10-20 DIAGNOSIS — M25551 Pain in right hip: Secondary | ICD-10-CM | POA: Diagnosis not present

## 2022-10-20 DIAGNOSIS — I10 Essential (primary) hypertension: Secondary | ICD-10-CM | POA: Diagnosis not present

## 2022-10-20 DIAGNOSIS — Y69 Unspecified misadventure during surgical and medical care: Secondary | ICD-10-CM | POA: Diagnosis not present

## 2022-10-20 DIAGNOSIS — S73034A Other anterior dislocation of right hip, initial encounter: Secondary | ICD-10-CM | POA: Diagnosis not present

## 2022-10-20 DIAGNOSIS — T84020A Dislocation of internal right hip prosthesis, initial encounter: Secondary | ICD-10-CM | POA: Diagnosis not present

## 2022-10-20 DIAGNOSIS — M25572 Pain in left ankle and joints of left foot: Secondary | ICD-10-CM | POA: Diagnosis not present

## 2022-10-20 DIAGNOSIS — S73004A Unspecified dislocation of right hip, initial encounter: Secondary | ICD-10-CM

## 2022-10-20 DIAGNOSIS — Z96643 Presence of artificial hip joint, bilateral: Secondary | ICD-10-CM | POA: Diagnosis not present

## 2022-10-20 MED ORDER — FENTANYL CITRATE PF 50 MCG/ML IJ SOSY
50.0000 ug | PREFILLED_SYRINGE | Freq: Once | INTRAMUSCULAR | Status: AC
  Administered 2022-10-20: 50 ug via INTRAVENOUS
  Filled 2022-10-20: qty 1

## 2022-10-20 MED ORDER — PROPOFOL 10 MG/ML IV BOLUS
INTRAVENOUS | Status: AC | PRN
  Administered 2022-10-20 (×3): 20 mg via INTRAVENOUS

## 2022-10-20 MED ORDER — SODIUM CHLORIDE 0.9 % IV BOLUS
1000.0000 mL | Freq: Once | INTRAVENOUS | Status: AC
  Administered 2022-10-20: 1000 mL via INTRAVENOUS

## 2022-10-20 MED ORDER — PROPOFOL 10 MG/ML IV BOLUS
0.5000 mg/kg | Freq: Once | INTRAVENOUS | Status: AC
  Administered 2022-10-20: 27.5 mg via INTRAVENOUS
  Filled 2022-10-20 (×2): qty 20

## 2022-10-20 NOTE — ED Notes (Signed)
RT placed pt on CO2 monitoring for conscious sedation. RT increased oxygen up to 5L during the procedure following O2 saturation. Upon completion of the procedure RT decreased pt's O2 back to her baseline of 2L.

## 2022-10-20 NOTE — Discharge Instructions (Signed)
Please follow up with your orthopedist

## 2022-10-20 NOTE — ED Triage Notes (Signed)
BIB [facility] from home. NAD. A+Ox4.  Bending down and R hip popped out. Has happened several times. Remained standing. HTN with EMS.  97% on 2LPM via [place] (baseline)

## 2022-10-20 NOTE — ED Provider Notes (Signed)
[place] EMERGENCY DEPARTMENT AT [facility] Provider Note   CSN: [id number] Arrival date & time: 10/20/22  1653     History  Chief Complaint  Patient presents with   Hip Pain    Right    Priscilla Cantrell is a 87 y.o. female.  87 yo F with a chief complaints of right hip pain.  Patient states she bent over to do something and felt a pop.  Has had that hip dislocate a couple times in her previously.  She is pretty sure that happened again.  She denies any fall or any other injury.  Pain to her right groin.   Hip Pain       Home Medications Prior to Admission medications   Medication Sig Start Date End Date Taking? Authorizing Provider  albuterol (PROAIR HFA) 108 (90 Base) MCG/ACT inhaler Inhale 2 puffs into the lungs every 4 (four) hours as needed for wheezing or shortness of breath. 01/08/22   Coralyn Helling, MD  albuterol (PROVENTIL) (2.5 MG/3ML) 0.083% nebulizer solution Take 3 mLs (2.5 mg total) by nebulization every 6 (six) hours as needed for wheezing or shortness of breath. 09/09/21   Coralyn Helling, MD  alendronate (FOSAMAX) 70 MG tablet TAKE 1 TABLET(70 MG) BY MOUTH 1 TIME A WEEK WITH A FULL GLASS OF WATER AND ON AN EMPTY STOMACH 09/10/20   McGowen, Maryjean Morn, MD  Artificial Tear Solution (SOOTHE XP) SOLN Place 1 drop into both eyes 2 (two) times daily as needed (irritation).    [provider]  azelastine (ASTELIN) 0.1 % nasal spray Place 2 sprays into both nostrils 2 (two) times daily. Use in each nostril as directed 03/03/22   McGowen, Maryjean Morn, MD  cetirizine (ZYRTEC) 10 MG tablet Take 1 tablet (10 mg total) by mouth daily. 05/22/20   McGowen, Maryjean Morn, MD  clobetasol (TEMOVATE) 0.05 % external solution APPLY TO THE AFFECTED AREA EVERY DAY 03/08/20   Sheffield, Harvin Hazel R, PA-C  clobetasol cream (TEMOVATE) 0.05 % Apply 1 Application topically 2 (two) times daily. Dr. hall    [provider]  cycloSPORINE (RESTASIS OP) Place 1 drop into both eyes daily as  needed (dry eyes).    [provider]  diazepam (VALIUM) 2 MG tablet 1/2 tab po qhs 09/25/22   McGowen, Maryjean Morn, MD  fluticasone (FLONASE) 50 MCG/ACT nasal spray Place 2 sprays into both nostrils daily. 05/22/20   McGowen, Maryjean Morn, MD  Fluticasone-Umeclidin-Vilant (TRELEGY ELLIPTA) 100-62.5-25 MCG/ACT AEPB Inhale 1 puff into the lungs daily. 05/19/22   Coralyn Helling, MD  furosemide (LASIX) 20 MG tablet 1 tab po twice a day as needed for LE swelling 03/03/22   McGowen, Maryjean Morn, MD  levothyroxine (SYNTHROID) 100 MCG tablet Take 1/2 of the 100 microgram tab every other day. 08/11/22   McGowen, Maryjean Morn, MD  lidocaine (XYLOCAINE) 2 % solution 5 ml po swish and spit tid prior to eating 09/25/22   McGowen, Maryjean Morn, MD  Lifitegrast Benay Spice) 5 % SOLN Place 1 drop into both eyes in the morning and at bedtime.    [provider]  Misc. Devices (PULSE OXIMETER) MISC Check oxygen level as needed 03/12/22   McGowen, Maryjean Morn, MD  ondansetron (ZOFRAN-ODT) 4 MG disintegrating tablet Take 1 tablet (4 mg total) by mouth every 8 (eight) hours as needed for vomiting or nausea. 05/17/22   Mardene Sayer, MD  pantoprazole (PROTONIX) 40 MG tablet TAKE 1 TABLET(40 MG) BY MOUTH TWICE DAILY 09/01/22  McGowen, Maryjean Morn, MD  roflumilast (DALIRESP) 500 MCG TABS tablet Take 1 tablet (500 mcg total) by mouth daily. 05/31/20   Glenford Bayley, NP  vitamin B-12 (CYANOCOBALAMIN) 100 MCG tablet Take 100 mcg by mouth daily.    [provider]  VITAMIN D PO Take 1 tablet by mouth daily.    [provider]      Allergies    Losartan, Penicillins, Cefixime [kdc:cefixime], Gabapentin, Indocin [indomethacin], Singulair [montelukast sodium], Sulfa antibiotics, Ace inhibitors, and Statins    Review of Systems   Review of Systems  Physical Exam Updated Vital Signs BP (!) 170/81   Pulse (!) 54   Temp 98.2 F (36.8 C)   Resp 18   Ht 5' (1.524 m)   Wt 55 kg   SpO2 92%   BMI 23.68 kg/m   Physical Exam Vitals and nursing note reviewed.  Constitutional:      General: She is not in acute distress.    Appearance: She is well-developed. She is not diaphoretic.  HENT:     Head: Normocephalic and atraumatic.  Eyes:     Pupils: Pupils are equal, round, and reactive to light.  Cardiovascular:     Rate and Rhythm: Normal rate and regular rhythm.     Heart sounds: No murmur heard.    No friction rub. No gallop.  Pulmonary:     Effort: Pulmonary effort is normal.     Breath sounds: No wheezing or rales.  Abdominal:     General: There is no distension.     Palpations: Abdomen is soft.     Tenderness: There is no abdominal tenderness.  Musculoskeletal:        General: No tenderness.     Cervical back: Normal range of motion and neck supple.     Comments: Shortening to the right lower extremity.  Pulse motor and sensation intact.  Skin:    General: Skin is warm and dry.  Neurological:     Mental Status: She is alert and oriented to person, place, and time.  Psychiatric:        Behavior: Behavior normal.     ED Results / Procedures / Treatments   Labs (all labs ordered are listed, but only abnormal results are displayed) Labs Reviewed - No data to display  EKG None  Radiology DG Pelvis Portable  Result Date: 10/20/2022 CLINICAL DATA:  Right hip pain after fall EXAM: PORTABLE PELVIS 1-2 VIEWS COMPARISON:  12/08/2017 FINDINGS: Bilateral total hip arthroplasties. The right hip arthroplasty is dislocated with the femoral component located superolaterally relative to the acetabular cup. Alignment of the left hip arthroplasty is maintained without dislocation. No fractures are identified. IMPRESSION: Dislocated right hip arthroplasty.  No fracture identified. Electronically Signed   By: Duanne Guess D.O.   On: 10/20/2022 19:29    Procedures .Sedation  Date/Time: 10/20/2022 7:04 PM  Performed by: Melene Plan, DO Authorized by: Melene Plan, DO   Consent:    Consent  obtained:  Written   Consent given by:  Patient   Risks discussed:  Allergic reaction, dysrhythmia, inadequate sedation, nausea and prolonged hypoxia resulting in organ damage   Alternatives discussed:  Analgesia without sedation Universal protocol:    Procedure explained and questions answered to patient or proxy's satisfaction: yes     Immediately prior to procedure, a time out was called: yes     Patient identity confirmed:  Anonymous protocol, patient vented/unresponsive and arm band Indications:    Procedure performed:  Dislocation reduction   Procedure necessitating sedation performed by:  Physician performing sedation Pre-sedation assessment:    Time since last food or drink:  6   ASA classification: class 2 - patient with mild systemic disease     Mouth opening:  2 finger widths   Thyromental distance:  3 finger widths   Mallampati score:  II - soft palate, uvula, fauces visible   Neck mobility: reduced     Pre-sedation assessments completed and reviewed: airway patency, cardiovascular function, hydration status, mental status, nausea/vomiting, pain level, respiratory function and temperature   Immediate pre-procedure details:    Reassessment: Patient reassessed immediately prior to procedure     Reviewed: vital signs     Verified: bag valve mask available, emergency equipment available, intubation equipment available, IV patency confirmed, oxygen available and suction available   Procedure details (see MAR for exact dosages):    Preoxygenation:  Nasal cannula   Sedation:  Propofol   Intended level of sedation: moderate (conscious sedation)   Analgesia:  Fentanyl   Intra-procedure monitoring:  Cardiac monitor, blood pressure monitoring, continuous capnometry, continuous pulse oximetry, frequent LOC assessments and frequent vital sign checks   Intra-procedure events: none     Total Provider sedation time (minutes):  35 Post-procedure details:    Attendance: Constant attendance  by certified staff until patient recovered     Recovery: Patient returned to pre-procedure baseline     Post-sedation assessments completed and reviewed: airway patency, cardiovascular function, hydration status, mental status, nausea/vomiting, pain level, respiratory function and temperature     Patient is stable for discharge or admission: yes     Procedure completion:  Tolerated well, no immediate complications Reduction of dislocation  Date/Time: 10/20/2022 7:04 PM  Performed by: Melene Plan, DO Authorized by: Melene Plan, DO  Consent: Written consent obtained. Risks and benefits: risks, benefits and alternatives were discussed Consent given by: patient and spouse Patient understanding: patient states understanding of the procedure being performed Imaging studies: imaging studies available Required items: required blood products, implants, devices, and special equipment available Patient identity confirmed: verbally with patient Time out: Immediately prior to procedure a "time out" was called to verify the correct patient, procedure, equipment, support staff and site/side marked as required. Local anesthesia used: no  Anesthesia: Local anesthesia used: no  Sedation: Patient sedated: yes Sedation type: moderate (conscious) sedation Sedatives: propofol Analgesia: fentanyl Vitals: Vital signs were monitored during sedation.  Patient tolerance: patient tolerated the procedure well with no immediate complications       Medications Ordered in ED Medications  propofol (DIPRIVAN) 10 mg/mL bolus/IV push 27.5 mg (has no administration in time range)  fentaNYL (SUBLIMAZE) injection 50 mcg (50 mcg Intravenous Given 10/20/22 1720)  sodium chloride 0.9 % bolus 1,000 mL (0 mLs Intravenous Stopped 10/20/22 1840)  fentaNYL (SUBLIMAZE) injection 50 mcg (50 mcg Intravenous Given 10/20/22 1822)  propofol (DIPRIVAN) 10 mg/mL bolus/IV push (20 mg Intravenous Given 10/20/22 1854)    ED Course/  Medical Decision Making/ A&P                                 Medical Decision Making Amount and/or Complexity of Data Reviewed Radiology: ordered.  Risk Prescription drug management.   87 yo F with a chief complaints of a right hip dislocation.  This has happened to her twice previously.  Will obtain a plain film.  Sedate.  Plain film independently interpreted by me  with a dislocated right hip.  This was reduced at bedside.  Repeat film independently interpreted by me with good reduction.  Patient feeling much better on repeat assessment.  Will discharge home.  Orthopedic follow-up.  8:07 PM:  I have discussed the diagnosis/risks/treatment options with the patient.  Evaluation and diagnostic testing in the emergency department does not suggest an emergent condition requiring admission or immediate intervention beyond what has been performed at this time.  They will follow up with Ortho. We also discussed returning to the ED immediately if new or worsening sx occur. We discussed the sx which are most concerning (e.g., sudden worsening pain, fever, inability to tolerate by mouth) that necessitate immediate return. Medications administered to the patient during their visit and any new prescriptions provided to the patient are listed below.  Medications given during this visit Medications  propofol (DIPRIVAN) 10 mg/mL bolus/IV push 27.5 mg (has no administration in time range)  fentaNYL (SUBLIMAZE) injection 50 mcg (50 mcg Intravenous Given 10/20/22 1720)  sodium chloride 0.9 % bolus 1,000 mL (0 mLs Intravenous Stopped 10/20/22 1840)  fentaNYL (SUBLIMAZE) injection 50 mcg (50 mcg Intravenous Given 10/20/22 1822)  propofol (DIPRIVAN) 10 mg/mL bolus/IV push (20 mg Intravenous Given 10/20/22 1854)     The patient appears reasonably screen and/or stabilized for discharge and I doubt any other medical condition or other [facility] requiring further screening, evaluation, or treatment in the ED at this time  prior to discharge.          Final Clinical Impression(s) / ED Diagnoses Final diagnoses:  Hip dislocation, right, initial encounter [facility])    Rx / DC Orders ED Discharge Orders     None         Melene Plan, DO 10/20/22 2007

## 2022-10-23 ENCOUNTER — Encounter (HOSPITAL_BASED_OUTPATIENT_CLINIC_OR_DEPARTMENT_OTHER): Payer: Self-pay | Admitting: Pulmonary Disease

## 2022-10-23 ENCOUNTER — Ambulatory Visit (INDEPENDENT_AMBULATORY_CARE_PROVIDER_SITE_OTHER): Payer: Medicare PPO | Admitting: Pulmonary Disease

## 2022-10-23 VITALS — BP 142/84 | HR 65 | Ht 62.0 in | Wt 121.0 lb

## 2022-10-23 DIAGNOSIS — J439 Emphysema, unspecified: Secondary | ICD-10-CM

## 2022-10-23 DIAGNOSIS — J4489 Other specified chronic obstructive pulmonary disease: Secondary | ICD-10-CM

## 2022-10-23 DIAGNOSIS — J9611 Chronic respiratory failure with hypoxia: Secondary | ICD-10-CM | POA: Diagnosis not present

## 2022-10-23 DIAGNOSIS — Z23 Encounter for immunization: Secondary | ICD-10-CM

## 2022-10-23 NOTE — Progress Notes (Signed)
te   Pulmonary, Critical Care, and Sleep Medicine  Chief Complaint  Patient presents with   COPD    Patient wearing POC.  Oxygen dropped to 87% after amulating on 2L.  Increased to 4L and patient stabilized at 97%    Constitutional:  BP (!) 142/84   Pulse 65   Ht 5\' 2"  (1.575 m)   Wt 121 lb (54.9 kg)   SpO2 90% Comment: 4L  BMI 22.13 kg/m   Past Medical History:  Chronic cystitis, DDD, Anxiety, Depression, C diff 2017 and 2018, Diverticulosis, GERD, HTN, Hypothyroidism, PNA, Shingles, Spinal stenosis  Past Surgical History:  She  has a past surgical history that includes Cholecystectomy (2001); Tubal ligation (1974); Abdominal hysterectomy (1978); Breast cyst excision; Appendectomy; Tonsillectomy; Colonoscopy (04/2005; 12/06/2015); transthoracic echocardiogram (08/2014); Total hip arthroplasty (Right, 11/01/2014); Hip Closed Reduction (Right, 12/25/2015); DEXA (05/18/2017; 08/7019); Total hip arthroplasty (Left, 12/08/2017); and NCV/EMS (10/2018).  Brief Summary:  Priscilla Cantrell is a 87 y.o. female former smoker with GOLD 4 COPD, ACE cough, and nocturnal hypoxemia.       Subjective:   She is here with her fiance.  Her hip became dislocated again and she had to go to the ER.  Has appointment with Dr. Charlann Boxer scheduled.  Hair slowing growing back.  Not having cough, wheeze, or sputum.  Still has trouble with her balance.  Needs to use 4 liters with exertion.  Has trouble with her memory and gets episodes of confusion usually in the late afternoon.  Physical Exam:   Appearance - wearing oxygen  ENMT - no sinus tenderness, no oral exudate, no LAN, Mallampati 2 airway, no stridor  Respiratory - decreased breath sounds, no wheeze  CV - s1s2 regular rate and rhythm, no murmurs  Ext - Rt leg in a brace, no clubbing, no edema  Skin - no rashes  Psych - normal mood and affect        Pulmonary testing:  Spirometry 01/10/09 >> FEV1 0.97(52%), FEV1% 47 March 2012  >> Daliresp started RAST 05/04/17 >> negative, IgE 8 Spirometry 05/14/17 >> FEV1 0.91 (55%), FEV1% 48 PFT 10/29/18 >> FEV1 1.05 (68%), FEV1% 52, TLC 4.29 (96%), DLCO 44%  Sleep Tests:  PSG 11/14/11 >> AHI 0.3  ONO with RA 07/31/16 >> test time 6 hrs 41 min.  Average SpO2 87%, low SpO2 74%.  Spent 4 hrs 13 min with SpO2 < 88%. ONO with 2 liters 07/24/21 >> test time 3 hrs 58 min.  Baseline SpO2 97%, low SpO2 84%.  Spent 19 sec with SpO2 < 88%.  Cardiac Tests:  Echo 06/21/21 >> EF 60 to 65%, grade 1 DD, mild elevation in PASP, mild LA dilation  Social History:  She  reports that she quit smoking about 32 years ago. Her smoking use included cigarettes. She started smoking about 66 years ago. She has a 51 pack-year smoking history. She has never used smokeless tobacco. She reports current alcohol use. She reports that she does not use drugs.  Family History:  Her family history includes Cancer in her maternal aunt and paternal grandfather; Cirrhosis in her father; Diabetes in her daughter; Goiter in her mother; Hernia in her paternal grandmother; Hypertension in her paternal grandmother; Psoriasis in her father; Stroke in her maternal grandfather and paternal grandmother.     Assessment/Plan:   GOLD 4 COPD with chronic bronchitis. - continue trelegy 100 one puff daily, daliresp 500 mg daily - prn albuterol, flutter valve, mucinex - she has a nebulizer -  high dose flu shot today  Long COVID. - from September 2023 - symptoms of fatigue, dyspnea and brain fog persist  Allergic rhinitis with upper airway cough. - prn zyrtec, azelastine, flonase  Laryngopharyngeal reflux. - she is to follow up with GI   Chronic respiratory failure with hypoxia. - goal SpO2 > 90% - using 2 liters at rest and 4 liters with exertion since she had Influenza  Hip dislocation. - she has appointment with Dr. Charlann Boxer with orthopedics - explained that she will need to find out therapy plan from orthopedics before  determine what her respiratory risk would be, but she would be high risk if she needs general anesthesia - I am also concerned about her ability to participate in rehab if she needs surgery  Time Spent Involved in Patient Care on Day of Examination:  35 minutes  Follow up:   Patient Instructions  High dose flu shot today  Follow up in 4 months  Medication List:   Allergies as of 10/23/2022       Reactions   Losartan Other (See Comments)   hyperkalemia   Penicillins Hives, Swelling, Rash   Has patient had a PCN reaction causing immediate rash, facial/tongue/throat swelling, SOB or lightheadedness with hypotension: YES Has patient had a PCN reaction causing severe rash involving mucus membranes or skin necrosis: NO Has patient had a PCN reaction that required hospitalization NO Has patient had a PCN reaction occurring within the last 10 years: NO If all of the above answers are "NO", then may proceed with Cephalosporin use.   Cefixime [kdc:cefixime] Other (See Comments)   unspecified   Gabapentin Other (See Comments)   Feels woozy   Indocin [indomethacin] Other (See Comments)   Painful tongue and throat   Singulair [montelukast Sodium]    Chest tightness, decreased mental clarity   Sulfa Antibiotics Other (See Comments)   unspecified   Ace Inhibitors Other (See Comments)   cough   Statins Other (See Comments)   Myalgias        Medication List        Accurate as of October 23, 2022  4:42 PM. If you have any questions, ask your nurse or doctor.          albuterol (2.5 MG/3ML) 0.083% nebulizer solution Commonly known as: PROVENTIL Take 3 mLs (2.5 mg total) by nebulization every 6 (six) hours as needed for wheezing or shortness of breath.   albuterol 108 (90 Base) MCG/ACT inhaler Commonly known as: ProAir HFA Inhale 2 puffs into the lungs every 4 (four) hours as needed for wheezing or shortness of breath.   alendronate 70 MG tablet Commonly known as:  FOSAMAX TAKE 1 TABLET(70 MG) BY MOUTH 1 TIME A WEEK WITH A FULL GLASS OF WATER AND ON AN EMPTY STOMACH   azelastine 0.1 % nasal spray Commonly known as: ASTELIN Place 2 sprays into both nostrils 2 (two) times daily. Use in each nostril as directed   cetirizine 10 MG tablet Commonly known as: ZYRTEC Take 1 tablet (10 mg total) by mouth daily.   clobetasol cream 0.05 % Commonly known as: TEMOVATE Apply 1 Application topically 2 (two) times daily. Dr. hall   clobetasol 0.05 % external solution Commonly known as: TEMOVATE APPLY TO THE AFFECTED AREA EVERY DAY   diazepam 2 MG tablet Commonly known as: Valium 1/2 tab po qhs   fluticasone 50 MCG/ACT nasal spray Commonly known as: FLONASE Place 2 sprays into both nostrils daily.   furosemide 20  MG tablet Commonly known as: LASIX 1 tab po twice a day as needed for LE swelling   levothyroxine 100 MCG tablet Commonly known as: SYNTHROID Take 1/2 of the 100 microgram tab every other day.   lidocaine 2 % solution Commonly known as: XYLOCAINE 5 ml po swish and spit tid prior to eating   ondansetron 4 MG disintegrating tablet Commonly known as: ZOFRAN-ODT Take 1 tablet (4 mg total) by mouth every 8 (eight) hours as needed for vomiting or nausea.   pantoprazole 40 MG tablet Commonly known as: PROTONIX TAKE 1 TABLET(40 MG) BY MOUTH TWICE DAILY   Pulse Oximeter Misc Check oxygen level as needed   RESTASIS OP Place 1 drop into both eyes daily as needed (dry eyes).   roflumilast 500 MCG Tabs tablet Commonly known as: DALIRESP Take 1 tablet (500 mcg total) by mouth daily.   Soothe XP Soln Place 1 drop into both eyes 2 (two) times daily as needed (irritation).   Trelegy Ellipta 100-62.5-25 MCG/ACT Aepb Generic drug: Fluticasone-Umeclidin-Vilant Inhale 1 puff into the lungs daily.   vitamin B-12 100 MCG tablet Commonly known as: CYANOCOBALAMIN Take 100 mcg by mouth daily.   VITAMIN D PO Take 1 tablet by mouth daily.    Xiidra 5 % Soln Generic drug: Lifitegrast Place 1 drop into both eyes in the morning and at bedtime.        Signature:  Coralyn Helling, MD [facility] Pulmonary/Critical Care Pager - [phone] 10/23/2022, 4:42 PM

## 2022-10-23 NOTE — Patient Instructions (Signed)
High dose flu shot today  Follow up in 4 months

## 2022-10-23 NOTE — ED Notes (Signed)
Patient safety time-out performed with this RN, Dr. Adela Lank, and Toni Amend, Charge RN prior to IV Propofol administration for sedation.

## 2022-10-27 DIAGNOSIS — Z471 Aftercare following joint replacement surgery: Secondary | ICD-10-CM | POA: Diagnosis not present

## 2022-10-27 DIAGNOSIS — Z96641 Presence of right artificial hip joint: Secondary | ICD-10-CM | POA: Diagnosis not present

## 2022-10-30 DIAGNOSIS — J449 Chronic obstructive pulmonary disease, unspecified: Secondary | ICD-10-CM | POA: Diagnosis not present

## 2022-10-30 DIAGNOSIS — Z96641 Presence of right artificial hip joint: Secondary | ICD-10-CM | POA: Diagnosis not present

## 2022-10-30 DIAGNOSIS — R0902 Hypoxemia: Secondary | ICD-10-CM | POA: Diagnosis not present

## 2022-10-30 DIAGNOSIS — J984 Other disorders of lung: Secondary | ICD-10-CM | POA: Diagnosis not present

## 2022-10-31 ENCOUNTER — Ambulatory Visit: Payer: Medicare PPO | Admitting: Family Medicine

## 2022-11-01 DIAGNOSIS — J020 Streptococcal pharyngitis: Secondary | ICD-10-CM | POA: Diagnosis not present

## 2022-11-01 DIAGNOSIS — Z03818 Encounter for observation for suspected exposure to other biological agents ruled out: Secondary | ICD-10-CM | POA: Diagnosis not present

## 2022-11-01 DIAGNOSIS — R0602 Shortness of breath: Secondary | ICD-10-CM | POA: Diagnosis not present

## 2022-11-01 DIAGNOSIS — J189 Pneumonia, unspecified organism: Secondary | ICD-10-CM | POA: Diagnosis not present

## 2022-11-01 DIAGNOSIS — J441 Chronic obstructive pulmonary disease with (acute) exacerbation: Secondary | ICD-10-CM | POA: Diagnosis not present

## 2022-11-06 ENCOUNTER — Ambulatory Visit: Payer: Medicare PPO | Admitting: Family Medicine

## 2022-11-06 DIAGNOSIS — E039 Hypothyroidism, unspecified: Secondary | ICD-10-CM

## 2022-11-06 NOTE — Progress Notes (Signed)
 No show

## 2022-11-12 ENCOUNTER — Ambulatory Visit (INDEPENDENT_AMBULATORY_CARE_PROVIDER_SITE_OTHER): Payer: Medicare PPO | Admitting: Family Medicine

## 2022-11-12 ENCOUNTER — Encounter: Payer: Self-pay | Admitting: Family Medicine

## 2022-11-12 VITALS — BP 132/60 | HR 50 | Temp 98.1°F | Ht 62.0 in | Wt 121.0 lb

## 2022-11-12 DIAGNOSIS — Z96641 Presence of right artificial hip joint: Secondary | ICD-10-CM | POA: Diagnosis not present

## 2022-11-12 DIAGNOSIS — L659 Nonscarring hair loss, unspecified: Secondary | ICD-10-CM

## 2022-11-12 DIAGNOSIS — F0390 Unspecified dementia without behavioral disturbance: Secondary | ICD-10-CM | POA: Diagnosis not present

## 2022-11-12 DIAGNOSIS — E039 Hypothyroidism, unspecified: Secondary | ICD-10-CM | POA: Diagnosis not present

## 2022-11-12 DIAGNOSIS — J984 Other disorders of lung: Secondary | ICD-10-CM | POA: Diagnosis not present

## 2022-11-12 DIAGNOSIS — I10 Essential (primary) hypertension: Secondary | ICD-10-CM

## 2022-11-12 DIAGNOSIS — R0902 Hypoxemia: Secondary | ICD-10-CM | POA: Diagnosis not present

## 2022-11-12 DIAGNOSIS — J449 Chronic obstructive pulmonary disease, unspecified: Secondary | ICD-10-CM | POA: Diagnosis not present

## 2022-11-12 LAB — TSH: TSH: 23.11 u[IU]/mL — ABNORMAL HIGH (ref 0.35–5.50)

## 2022-11-12 MED ORDER — PANTOPRAZOLE SODIUM 40 MG PO TBEC: 40.0000 mg | DELAYED_RELEASE_TABLET | Freq: Two times a day (BID) | ORAL | 1 refills | Status: DC

## 2022-11-12 NOTE — Progress Notes (Signed)
OFFICE VISIT  11/12/2022  CC:  Chief Complaint  Patient presents with   Medical Management of Chronic Issues    Patient is a 87 y.o. female who presents accompanied by her boyfriend Fayrene Fearing for 6-week follow-up alopecia and hypothyroidism. A/P as of last visit: "1 alopecia.  Suspect spironolactone was causing at least some of this. It is improving with recent discontinuation of spironolactone as well as with use of clobetasol 0.05% external solution prescribed by her dermatologist Dr. Margo Aye.   #2 hypertension, blood pressure normal despite discontinuation of spironolactone. Currently on no antihypertensives at all.   3.  Hypothyroidism.  She is oversupplemented lately. Most recent TSH is 0.10 about 2 weeks ago. There has been some confusion as to dose adjustments lately. Reiterated today that she is to change her dos to one half of a 100 mcg tab every other day. Next TSH 6 weeks.   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 Day times are pretty good, evenings she has quite significant sundowning. Trying to make sure polypharmacy does not contribute.  She is doing well with cutting back on her diazepam to one half of a 5 mg tab nightly.  She will continue this for a few more nights and then we will cut her up to one half of a 2 mg tab nightly for a couple of weeks before discontinuing this med completely. She was on venlafaxine long-term but has been off of this of late (??how long) a while so we will take it off her med list.   #5 oral ulcer.  Due to poorly fitting dentures. Prescribed viscous lidocaine to use 1 teaspoon prior to meals--swish and spit."  INTERIM HX: Chip Boer is doing good.  Her hair continues to gradually fill in. She has still been taking her levothyroxine 100 mcg tab at a dosing of one half tab daily instead of every other day.  She is eating very well. No lower extremity swelling, no need for Lasix.  No agitation or increased anxiety or problems sleeping since  we decreased her diazepam.  She has been on only one half of a 2 mg tab lately.  ROS as above, plus--> no fevers, no CP, no SOB, no wheezing, no cough, no dizziness, no HAs, no rashes, no melena/hematochezia.  No polyuria or polydipsia.  No myalgias or arthralgias.  No focal weakness, paresthesias, or tremors.  No acute vision or hearing abnormalities.  No dysuria or unusual/new urinary urgency or frequency.  No recent changes in lower legs. No n/v/d or abd pain.  No palpitations.      Past Medical History:  Diagnosis Date   Allergic rhinitis    with upper airway cough: as of 07/2017 pulm f/u she was instructed to use astelin, flonase, and saline more consistently   BPPV (benign paroxysmal positional vertigo) 03/2018   C. difficile colitis    Chest pain, non-cardiac    Cardiac CT showed no signif obstructive dz, mildly elevated calcium level (Dr. Jens Som).   Chronic cystitis with hematuria    Dr. Lenon Curt   Chronic hypoxemic respiratory failure (HCC) 02/02/2015   2L oxygen 24/7   Chronic renal insufficiency, stage 2 (mild)    GFR approx 60   Cold hands    NCS/EMGs normal.  Hand dysesthesias per neuro--reassured   COPD (chronic obstructive pulmonary disease) (HCC)    GOLD 4.  spirometry 01/10/09 FEV 0.97(52%), FEV1% 47.  With chronic bronchitis as of 07/2017.   DDD (degenerative disc disease), cervical    1998  MRI C5-6 impingemt (left).  MRI 2021 (neurol) 1 level canal stenosis, 1 level foraminal stenosis--->PT   DDD (degenerative disc disease), lumbar    Depression with anxiety 04/15/2011   Diarrheal disease Summer 2017   ? 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 Dr Kinnie Scales).  As of 10/05/15 pt reports GI dx'd her with C diff and rx'd more flagyl and she is also on cholestyramine.  As of 02/2016, pt's sx's resolved completely.   Diverticulosis of colon    Procto '97 and colonoscopy 2002    Dysplastic nevus of upper extremity 04/2014   R tricep (Dr. Jorja Loa)   Edema of both lower extremities due to peripheral venous insufficiency    Varicosities bilat, swelling L>R.  R baker's cyst.  Got vein clinic eval 02/2018->sclerotherapy recommended but since no hemorrhage or ulceration, insurance will not cover the procedure.   Family history of adverse reaction to anesthesia    mother died when patient age 43 receiving anesthesia for thyroid surgery   Fibrocystic breast disease    w/fibroadenoma.  Bx's showed NO atypia (Dr. Jamey Ripa).  Mammo neg 2008.   GERD (gastroesophageal reflux disease)    History of double vision    Ophthalmologist, Dr. Elmer Picker, is further evaluating this with MRI  orbits and limited brain (myesthenia gravis testing neg 07/2010)   History of home oxygen therapy    uses 2 liters at hs   Hypertension    EKG 03/2010 normal   Hypothyroidism    Hashimoto's, dx'd 1989   Idiopathic peripheral neuropathy 04/2018   Sensory (numb feet) S1 distribution bilat, c/w spinal stenosis sensory neuropathy (no pain). 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  Labs [place], MRI C spine->some spondylosis/stenosis.  UE NCS/EMS: normal 04/2019.   Lesion of right native kidney 2017   found on CT 02/2015.  F/u MRI 03/31/15 showed it to be smaller and less likely of any significance but repeat CT renal protocol in 23mo was recommended by radiology.  This was done 10/26/15 and showed no interval change in the lesion, but b/c the lesion enhances with contrast, radiol rec'd urol consult (b/c pap renal RCC could not be excluded).  Saw urol 03/06/16--stable on u/s 09/2016 and 09/2017.   OAB (overactive bladder)    Osteoarthritis, hip, bilateral    she is s/p bilat THA as of 02/2018.   Osteoporosis    radius, T score -2.5->rec'd alendronate 08/2019->plan rpt DEXA 08/2021.   Pneumonia    Postmenopausal atrophic vaginitis    improved with estrace 2  X/week.   Prolapse of vaginal cuff after hysterectomy    10/2018: Dr. Katherina Right. Mild colpocele 04/2019 per GYN (no surgery)   Pulmonary nodule    CT chest 12/10, June 2011.  No nodule on CT 06/2010.   Recurrent Clostridium difficile diarrhea    Episode 09/19/2016: resolved with prolonged course of flagyl.  11/2016 recurrence treated with 10d of Dificid (Fidaxomicin) by GI (Dr. Kinnie Scales).   Recurrent UTI    likely related to pt's atrophic vaginitis.  Urol started vaginal estrace 03/2016.   Rib fracture 09/2018   R 7th, laterally-->sustained when her dog pulled her over and she fell.   RLS (restless legs syndrome)    neuro->gabapentin trial 2020/21   Shingles 02/2014   R abd/flank   Spinal stenosis of lumbar region with neurogenic claudication    2008 MRI (L3-4, L4-5, L5-S1).  +S1 distribution sensory loss  bilat    Past Surgical History:  Procedure Laterality Date   ABDOMINAL HYSTERECTOMY  1978   no BSO per pt--nonmalignant reasons   APPENDECTOMY     BREAST CYST EXCISION     Last screening mammogram 10/2010 was normal.   CHOLECYSTECTOMY  2001   COLONOSCOPY  04/2005; 12/06/2015   2007 NORMAL.  2017 adenomatous polyp x 1.  Mod sigmoid diverticulosis (Dr. Kinnie Scales).     DEXA  05/18/2017; 08/7019   2019 NORMAL (T-score 0.1).  08/2019->T score -2.5->alendronate started.  03/2022 T score -2.7   HIP CLOSED REDUCTION Right 12/25/2015   Procedure: CLOSED REDUCTION HIP;  Surgeon: Yolonda Kida, MD;  Location: [facility];  Service: Orthopedics;  Laterality: Right;   NCV/EMS  10/2018   chronic and symmetric sensorimotor axonal polyneuropathy affecting the lower extremities   TONSILLECTOMY     TOTAL HIP ARTHROPLASTY Right 11/01/2014   Procedure: RIGHT TOTAL HIP ARTHROPLASTY;  Surgeon: Ranee Gosselin, MD;  Location: [facility];  Service: Orthopedics;  Laterality: Right;   TOTAL HIP ARTHROPLASTY Left 12/08/2017   Procedure: LEFT TOTAL HIP ARTHROPLASTY ANTERIOR APPROACH;  Surgeon: Durene Romans, MD;  Location:  [facility];  Service: Orthopedics;  Laterality: Left;  70 mins   TRANSTHORACIC ECHOCARDIOGRAM  08/2014   2016 EF 60-65%, grade I diast dysfxn. 06/21/21 unchanged.   TUBAL LIGATION  1974    Outpatient Medications Prior to Visit  Medication Sig Dispense Refill   albuterol (PROAIR HFA) 108 (90 Base) MCG/ACT inhaler Inhale 2 puffs into the lungs every 4 (four) hours as needed for wheezing or shortness of breath. 8.5 g 1   alendronate (FOSAMAX) 70 MG tablet TAKE 1 TABLET(70 MG) BY MOUTH 1 TIME A WEEK WITH A FULL GLASS OF WATER AND ON AN EMPTY STOMACH 12 tablet 3   Artificial Tear Solution (SOOTHE XP) SOLN Place 1 drop into both eyes 2 (two) times daily as needed (irritation).     azelastine (ASTELIN) 0.1 % nasal spray Place 2 sprays into both nostrils 2 (two) times daily. Use in each nostril as directed 30 mL 3   cetirizine (ZYRTEC) 10 MG tablet Take 1 tablet (10 mg total) by mouth daily. 30 tablet 6   clobetasol (TEMOVATE) 0.05 % external solution APPLY TO THE AFFECTED AREA EVERY DAY 50 mL 4   clobetasol cream (TEMOVATE) 0.05 % Apply 1 Application topically 2 (two) times daily. Dr. hall     cycloSPORINE (RESTASIS OP) Apply to eye as needed.     fluticasone (FLONASE) 50 MCG/ACT nasal spray Place 2 sprays into both nostrils daily. 16 g 6   Fluticasone-Umeclidin-Vilant (TRELEGY ELLIPTA) 100-62.5-25 MCG/ACT AEPB Inhale 1 puff into the lungs daily. 180 each 3   furosemide (LASIX) 20 MG tablet 1 tab po twice a day as needed for LE swelling 60 tablet 1   levothyroxine (SYNTHROID) 100 MCG tablet Take 1/2 of the 100 microgram tab every other day. 90 tablet 1   lidocaine (XYLOCAINE) 2 % solution 5 ml po swish and spit tid prior to eating 100 mL 2   Lifitegrast (XIIDRA) 5 % SOLN Place 1 drop into both eyes in the morning and at bedtime.     Misc. Devices (PULSE OXIMETER) MISC Check oxygen level as needed 1 each 1   pantoprazole (PROTONIX) 40 MG tablet TAKE 1 TABLET(40 MG) BY MOUTH TWICE DAILY 180 tablet 0    roflumilast (DALIRESP) 500 MCG TABS tablet Take 1 tablet (500 mcg total) by mouth daily. 90 tablet 3  vitamin B-12 (CYANOCOBALAMIN) 100 MCG tablet Take 100 mcg by mouth daily.     VITAMIN D PO Take 1 tablet by mouth daily.     diazepam (VALIUM) 2 MG tablet 1/2 tab po qhs 10 tablet 0   albuterol (PROVENTIL) (2.5 MG/3ML) 0.083% nebulizer solution Take 3 mLs (2.5 mg total) by nebulization every 6 (six) hours as needed for wheezing or shortness of breath. (Patient not taking: Reported on 11/12/2022) 360 mL 5   ipratropium-albuterol (DUONEB) 0.5-2.5 (3) MG/3ML SOLN Inhale 3 mLs into the lungs every 6 (six) hours as needed. (Patient not taking: Reported on 11/12/2022)     cycloSPORINE (RESTASIS OP) Place 1 drop into both eyes daily as needed (dry eyes). (Patient not taking: Reported on 11/12/2022)     ondansetron (ZOFRAN-ODT) 4 MG disintegrating tablet Take 1 tablet (4 mg total) by mouth every 8 (eight) hours as needed for vomiting or nausea. (Patient not taking: Reported on 11/12/2022) 20 tablet 0   No facility-administered medications prior to visit.    Allergies  Allergen Reactions   Losartan Other (See Comments)    hyperkalemia   Penicillins Hives, Swelling and Rash    Has patient had a PCN reaction causing immediate rash, facial/tongue/throat swelling, SOB or lightheadedness with hypotension: YES Has patient had a PCN reaction causing severe rash involving mucus membranes or skin necrosis: NO Has patient had a PCN reaction that required hospitalization NO Has patient had a PCN reaction occurring within the last 10 years: NO If all of the above answers are "NO", then may proceed with Cephalosporin use.    Cefixime [Kdc:Cefixime] Other (See Comments)    unspecified   Gabapentin Other (See Comments)    Feels woozy   Indocin [Indomethacin] Other (See Comments)    Painful tongue and throat   Singulair [Montelukast Sodium]     Chest tightness, decreased mental clarity   Sulfa Antibiotics Other  (See Comments)    unspecified   Ace Inhibitors Other (See Comments)    cough   Statins Other (See Comments)    Myalgias     Review of Systems As per HPI  PE:    11/12/2022   10:39 AM 11/12/2022   10:25 AM 10/23/2022    1:41 PM  Vitals with BMI  Height  5\' 2"  5\' 2"   Weight  121 lbs 121 lbs  BMI  22.13 22.13  Systolic 132 143 161  Diastolic 60 73 84  Pulse  50 65  02 sat 95% on 2L oxygen  Physical Exam  Gen: Alert, well appearing.  Patient is oriented to person, place, time, and situation. AFFECT: pleasant, lucid thought and speech. CV: RRR, no m/r/g.   LUNGS: CTA bilat, nonlabored resps, good aeration in all lung fields. EXT: no clubbing or cyanosis.  no edema.    LABS:  Last CBC Lab Results  Component Value Date   WBC 11.9 (H) 05/17/2022   HGB 11.8 (L) 05/17/2022   HCT 37.7 05/17/2022   MCV 90.0 05/17/2022   MCH 28.2 05/17/2022   RDW 14.8 05/17/2022   PLT 259 05/17/2022   Last metabolic panel Lab Results  Component Value Date   GLUCOSE 100 (H) 05/17/2022   NA 142 05/17/2022   K 3.5 05/17/2022   CL 103 05/17/2022   CO2 30 05/17/2022   BUN 15 05/17/2022   CREATININE 0.81 05/17/2022   GFRNONAA >60 05/17/2022   CALCIUM 9.4 05/17/2022   PHOS 3.8 01/31/2013   PROT 6.1 (L) 10/29/2021  ALBUMIN 3.1 (L) 10/29/2021   BILITOT 0.4 10/29/2021   ALKPHOS 58 10/29/2021   AST 18 10/29/2021   ALT 42 10/29/2021   ANIONGAP 9 05/17/2022   Last thyroid functions Lab Results  Component Value Date   TSH 0.10 (L) 09/11/2022   T3TOTAL 96 09/11/2022   T4TOTAL 12.4 07/11/2010   IMPRESSION AND PLAN:  #1 alopecia. Gradually improving with discontinuation of spironolactone as well as with use of clobetasol 0.05% external solution prescribed by her dermatologist Dr. Margo Aye.  #2 hypertension, currently on no medication. Initial blood pressure here was 143/73 today.  Recheck 132/60. No medications at this time.  #3 Hypothyroidism.  She is oversupplemented lately. Most  recent TSH is 0.10 about 2 months ago. There has been some confusion as to dose adjustments lately (she has still been taking 1/2 of her 100 mcg levothyroxine tab daily). Check TSH today and then adjust dose as appropriate.   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 Stable. Day times are pretty good, evenings she has quite significant sundowning. Trying to make sure polypharmacy does not contribute.   She has done well with gradual weaning down diazepam. Will completely discontinue this med now.  An After Visit Summary was printed and given to the patient.  FOLLOW UP: Return in about 3 months (around 02/12/2023) for routine chronic illness f/u.  Signed:  Santiago Bumpers, MD           11/12/2022

## 2022-11-13 ENCOUNTER — Telehealth: Payer: Self-pay | Admitting: Family Medicine

## 2022-11-13 ENCOUNTER — Telehealth: Payer: Self-pay

## 2022-11-13 ENCOUNTER — Other Ambulatory Visit: Payer: Self-pay | Admitting: Family Medicine

## 2022-11-13 DIAGNOSIS — E039 Hypothyroidism, unspecified: Secondary | ICD-10-CM

## 2022-11-13 MED ORDER — LEVOTHYROXINE SODIUM 100 MCG PO TABS: ORAL_TABLET | ORAL | 1 refills | Status: DC

## 2022-11-13 NOTE — Telephone Encounter (Signed)
-----   Message from Jeoffrey Massed sent at 11/13/2022  2:46 PM EDT ----- Please call patient's boyfriend Priscilla Cantrell and tell him that [name]'s thyroid level is now too low. I recommend she take a whole 100 mcg levothyroxine tab every day. Lab appointment for TSH, T3 total, and free T4 in 6 weeks.  Diagnosis is acquired hypothyroidism.

## 2022-11-13 NOTE — Telephone Encounter (Signed)
Prescription Request  11/13/2022  LOV: 11/12/2022  What is the name of the medication or equipment? levothyroxine (SYNTHROID) 100 MCG tablet  Patients BF called and states that since she is to take a whole pill she will need more refills being as she only has enough for 4 days.   Have you contacted your pharmacy to request a refill? No   Which pharmacy would you like this sent to?  [id number] - [place], [place] - [address] [place] [address] Phone: [phone] Fax: [phone]    Patient notified that their request is being sent to the clinical staff for review and that they should receive a response within 2 business days.   Please advise at Mobile [phone] (mobile)

## 2022-11-13 NOTE — Telephone Encounter (Signed)
Refill sent, patient's boyfriend made aware

## 2022-11-18 NOTE — ED Notes (Signed)
Late entry - pre-procedure heart rhythm NSR.

## 2022-11-21 ENCOUNTER — Telehealth: Payer: Self-pay

## 2022-11-21 NOTE — Telephone Encounter (Signed)
Transition Care Management Unsuccessful Follow-up Telephone Call  Date of discharge and from where:  10/20/2022 [facility]  Attempts:  1st Attempt  Reason for unsuccessful TCM follow-up call:  No answer/busy  Glenette Bookwalter [facility]  [facility], [facility] Guide Direct Dial: [phone]  Website: [email or web address]

## 2022-11-21 NOTE — Telephone Encounter (Signed)
Transition Care Management Unsuccessful Follow-up Telephone Call  Date of discharge and from where:  10/20/2022 [facility]  Attempts:  2nd Attempt  Reason for unsuccessful TCM follow-up call:  Left voice message  Berwyn Bigley [facility]  [facility], [facility] Guide Direct Dial: [phone]  Website: [email or web address]

## 2022-11-26 ENCOUNTER — Other Ambulatory Visit: Payer: Self-pay | Admitting: Family Medicine

## 2022-11-26 DIAGNOSIS — Z1231 Encounter for screening mammogram for malignant neoplasm of breast: Secondary | ICD-10-CM | POA: Diagnosis not present

## 2022-11-26 LAB — HM MAMMOGRAPHY: HM Mammogram: NORMAL (ref 0–4)

## 2022-11-28 ENCOUNTER — Other Ambulatory Visit: Payer: Self-pay | Admitting: Family Medicine

## 2022-11-29 DIAGNOSIS — J449 Chronic obstructive pulmonary disease, unspecified: Secondary | ICD-10-CM | POA: Diagnosis not present

## 2022-11-29 DIAGNOSIS — J984 Other disorders of lung: Secondary | ICD-10-CM | POA: Diagnosis not present

## 2022-11-29 DIAGNOSIS — R0902 Hypoxemia: Secondary | ICD-10-CM | POA: Diagnosis not present

## 2022-11-29 DIAGNOSIS — Z96641 Presence of right artificial hip joint: Secondary | ICD-10-CM | POA: Diagnosis not present

## 2022-12-08 ENCOUNTER — Telehealth: Payer: Self-pay

## 2022-12-08 ENCOUNTER — Other Ambulatory Visit: Payer: Self-pay | Admitting: Family Medicine

## 2022-12-08 NOTE — Telephone Encounter (Signed)
No further action needed at this time.

## 2022-12-08 NOTE — Telephone Encounter (Signed)
As per our discussion at the last f/u visit, she should no longer be taking this medication.

## 2022-12-08 NOTE — Telephone Encounter (Signed)
Patient called in to ask why Dr. Milinda Cave is denying medication Diazepam.  I told patient from notes from Dr. Milinda Cave, patient was to stop taking this medication at her last visit.  Her female friend spoke up in the background and said "no, he told me to get her 1/2 tablet then increase to 1 tablet." I told him no, that she was was not to continue this medication.  Patient spoke up"the man I love you, what are you trying to do to me? That's why I am here and there and everywhere".  I asked her if she was able to come in this week to go over meds this week with Dr. Milinda Cave. Patient and female friend agreed.  Patient is scheduled to se Dr. Milinda Cave on 11/6

## 2022-12-10 ENCOUNTER — Ambulatory Visit: Payer: Medicare PPO | Admitting: Family Medicine

## 2022-12-10 ENCOUNTER — Encounter: Payer: Self-pay | Admitting: Family Medicine

## 2022-12-10 VITALS — BP 190/90 | HR 63 | Temp 98.3°F | Ht 62.0 in | Wt 120.2 lb

## 2022-12-10 DIAGNOSIS — Z79899 Other long term (current) drug therapy: Secondary | ICD-10-CM | POA: Diagnosis not present

## 2022-12-10 DIAGNOSIS — R5381 Other malaise: Secondary | ICD-10-CM | POA: Diagnosis not present

## 2022-12-10 DIAGNOSIS — F0390 Unspecified dementia without behavioral disturbance: Secondary | ICD-10-CM

## 2022-12-10 DIAGNOSIS — I10 Essential (primary) hypertension: Secondary | ICD-10-CM

## 2022-12-10 MED ORDER — AMLODIPINE BESYLATE 5 MG PO TABS: 5.0000 mg | ORAL_TABLET | Freq: Every day | ORAL | 0 refills | Status: DC

## 2022-12-10 NOTE — Progress Notes (Signed)
OFFICE VISIT  12/10/2022  CC:  Chief Complaint  Patient presents with   Medication Management    Patient is a 87 y.o. female who presents for medication management. I last saw her on 11/12/2022. A/P as of that visit: "#1 alopecia. Gradually improving with discontinuation of spironolactone as well as with use of clobetasol 0.05% external solution prescribed by her dermatologist Dr. Margo Aye.   #2 hypertension, currently on no medication. Initial blood pressure here was 143/73 today.  Recheck 132/60. No medications at this time.   #3 Hypothyroidism.  She is oversupplemented lately. Most recent TSH is 0.10 about 2 months ago. There has been some confusion as to dose adjustments lately (she has still been taking 1/2 of her 100 mcg levothyroxine tab daily). Check TSH today and then adjust dose as appropriate.   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 Stable. Day times are pretty good, evenings she has quite significant sundowning. Trying to make sure polypharmacy does not contribute.   She has done well with gradual weaning down diazepam. Will completely discontinue this med now."  INTERIM HX: There was some confusion about a refill request for diazepam.  They forgot that she was not on this medication anymore. Looking back, they confirmed that she has not had it for the last month---> this is correct. No change in mild dementia. She does describe feeling a bit of a flush sometimes.  Sounds like she is describing some orthostatic dizziness. No vertigo, no headache, no visual abnormalities, no chest pain. She checks her blood pressure occasionally at home and it has typically been normal. No recent check, though.  PMP AWARE reviewed today: most recent rx for diazepam 2 mg was filled 09/25/2022, # 10, rx by me. Prior to that patient filled diazepam 5 mg prescription on 08/26/2022, #30, prescription by me No red flags.  ROS as above, plus--> no fevers, no wheezing, no cough, no  dizziness. no melena/hematochezia.  No polyuria or polydipsia.  No myalgias or arthralgias.  No focal weakness, paresthesias, or tremors.  No acute vision or hearing abnormalities.  No dysuria or unusual/new urinary urgency or frequency.  No recent changes in lower legs. No n/v/d or abd pain.  No palpitations.    Past Medical History:  Diagnosis Date   Allergic rhinitis    with upper airway cough: as of 07/2017 pulm f/u she was instructed to use astelin, flonase, and saline more consistently   BPPV (benign paroxysmal positional vertigo) 03/2018   C. difficile colitis    Chest pain, non-cardiac    Cardiac CT showed no signif obstructive dz, mildly elevated calcium level (Dr. Jens Som).   Chronic cystitis with hematuria    Dr. Lenon Curt   Chronic hypoxemic respiratory failure (HCC) 02/02/2015   2L oxygen 24/7   Chronic renal insufficiency, stage 2 (mild)    GFR approx 60   Cold hands    NCS/EMGs normal.  Hand dysesthesias per neuro--reassured   COPD (chronic obstructive pulmonary disease) (HCC)    GOLD 4.  spirometry 01/10/09 FEV 0.97(52%), FEV1% 47.  With chronic bronchitis as of 07/2017.   DDD (degenerative disc disease), cervical    1998 MRI C5-6 impingemt (left).  MRI 2021 (neurol) 1 level canal stenosis, 1 level foraminal stenosis--->PT   DDD (degenerative disc disease), lumbar    Depression with anxiety 04/15/2011   Diarrheal disease Summer 2017   ? 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 Dr Kinnie Scales).  As of 10/05/15 pt reports GI dx'd her with C diff and rx'd more flagyl and she is also on cholestyramine.  As of 02/2016, pt's sx's resolved completely.   Diverticulosis of colon    Procto '97 and colonoscopy 2002   Dysplastic nevus of upper extremity 04/2014   R tricep (Dr. Jorja Loa)   Edema of both lower extremities due to peripheral venous insufficiency    Varicosities bilat, swelling  L>R.  R baker's cyst.  Got vein clinic eval 02/2018->sclerotherapy recommended but since no hemorrhage or ulceration, insurance will not cover the procedure.   Family history of adverse reaction to anesthesia    mother died when patient age 89 receiving anesthesia for thyroid surgery   Fibrocystic breast disease    w/fibroadenoma.  Bx's showed NO atypia (Dr. Jamey Ripa).  Mammo neg 2008.   GERD (gastroesophageal reflux disease)    History of double vision    Ophthalmologist, Dr. Elmer Picker, is further evaluating this with MRI  orbits and limited brain (myesthenia gravis testing neg 07/2010)   History of home oxygen therapy    uses 2 liters at hs   Hypertension    EKG 03/2010 normal   Hypothyroidism    Hashimoto's, dx'd 1989   Idiopathic peripheral neuropathy 04/2018   Sensory (numb feet) S1 distribution bilat, c/w spinal stenosis sensory neuropathy (no pain). 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  Labs [place], MRI C spine->some spondylosis/stenosis.  UE NCS/EMS: normal 04/2019.   Lesion of right native kidney 2017   found on CT 02/2015.  F/u MRI 03/31/15 showed it to be smaller and less likely of any significance but repeat CT renal protocol in 34mo was recommended by radiology.  This was done 10/26/15 and showed no interval change in the lesion, but b/c the lesion enhances with contrast, radiol rec'd urol consult (b/c pap renal RCC could not be excluded).  Saw urol 03/06/16--stable on u/s 09/2016 and 09/2017.   OAB (overactive bladder)    Osteoarthritis, hip, bilateral    she is s/p bilat THA as of 02/2018.   Osteoporosis    radius, T score -2.5->rec'd alendronate 08/2019->plan rpt DEXA 08/2021.   Pneumonia    Postmenopausal atrophic vaginitis    improved with estrace 2 X/week.   Prolapse of vaginal cuff after hysterectomy    10/2018: Dr. Katherina Right. Mild colpocele 04/2019 per GYN (no surgery)   Pulmonary nodule    CT chest 12/10, June 2011.  No  nodule on CT 06/2010.   Recurrent Clostridium difficile diarrhea    Episode 09/19/2016: resolved with prolonged course of flagyl.  11/2016 recurrence treated with 10d of Dificid (Fidaxomicin) by GI (Dr. Kinnie Scales).   Recurrent UTI    likely related to pt's atrophic vaginitis.  Urol started vaginal estrace 03/2016.   Rib fracture 09/2018   R 7th, laterally-->sustained when her dog pulled her over and she fell.   RLS (restless legs syndrome)    neuro->gabapentin trial 2020/21   Shingles 02/2014   R abd/flank   Spinal stenosis of lumbar region with neurogenic claudication    2008 MRI (L3-4, L4-5, L5-S1).  +S1 distribution sensory loss bilat    Past Surgical History:  Procedure Laterality Date   ABDOMINAL HYSTERECTOMY  1978   no BSO per pt--nonmalignant reasons   APPENDECTOMY     BREAST CYST EXCISION     Last screening mammogram 10/2010 was normal.   CHOLECYSTECTOMY  2001   COLONOSCOPY  04/2005; 12/06/2015   2007 NORMAL.  2017 adenomatous polyp x 1.  Mod sigmoid diverticulosis (Dr. Kinnie Scales).     DEXA  05/18/2017; 08/7019   2019 NORMAL (T-score 0.1).  08/2019->T score -2.5->alendronate started.  03/2022 T score -2.7   HIP CLOSED REDUCTION Right 12/25/2015   Procedure: CLOSED REDUCTION HIP;  Surgeon: Yolonda Kida, MD;  Location: [facility];  Service: Orthopedics;  Laterality: Right;   NCV/EMS  10/2018   chronic and symmetric sensorimotor axonal polyneuropathy affecting the lower extremities   TONSILLECTOMY     TOTAL HIP ARTHROPLASTY Right 11/01/2014   Procedure: RIGHT TOTAL HIP ARTHROPLASTY;  Surgeon: Ranee Gosselin, MD;  Location: [facility];  Service: Orthopedics;  Laterality: Right;   TOTAL HIP ARTHROPLASTY Left 12/08/2017   Procedure: LEFT TOTAL HIP ARTHROPLASTY ANTERIOR APPROACH;  Surgeon: Durene Romans, MD;  Location: [facility];  Service: Orthopedics;  Laterality: Left;  70 mins   TRANSTHORACIC ECHOCARDIOGRAM  08/2014   2016 EF 60-65%, grade I diast dysfxn. 06/21/21 unchanged.   TUBAL LIGATION   1974    Outpatient Medications Prior to Visit  Medication Sig Dispense Refill   azelastine (ASTELIN) 0.1 % nasal spray Place 2 sprays into both nostrils 2 (two) times daily. Use in each nostril as directed 30 mL 3   cycloSPORINE (RESTASIS OP) Apply to eye as needed.     fluticasone (FLONASE) 50 MCG/ACT nasal spray Place 2 sprays into both nostrils daily. 16 g 6   Fluticasone-Umeclidin-Vilant (TRELEGY ELLIPTA) 100-62.5-25 MCG/ACT AEPB Inhale 1 puff into the lungs daily. 180 each 3   levothyroxine (SYNTHROID) 100 MCG tablet 1 tab po qd 30 tablet 1   Misc. Devices (PULSE OXIMETER) MISC Check oxygen level as needed 1 each 1   vitamin B-12 (CYANOCOBALAMIN) 100 MCG tablet Take 100 mcg by mouth daily.     VITAMIN D PO Take 1 tablet by mouth daily.     albuterol (PROAIR HFA) 108 (90 Base) MCG/ACT inhaler Inhale 2 puffs into the lungs every 4 (four) hours as needed for wheezing or shortness of breath. (Patient not taking: Reported on 12/10/2022) 8.5 g 1   albuterol (PROVENTIL) (2.5 MG/3ML) 0.083% nebulizer solution Take 3 mLs (2.5 mg total) by nebulization every 6 (six) hours as needed for wheezing or shortness of breath. (Patient not taking: Reported on 11/12/2022) 360 mL 5   alendronate (FOSAMAX) 70 MG tablet TAKE 1 TABLET(70 MG) BY MOUTH 1 TIME A WEEK WITH A FULL GLASS OF WATER AND ON AN EMPTY STOMACH (Patient not taking: Reported on 12/10/2022) 12 tablet 3   Artificial Tear Solution (SOOTHE XP) SOLN Place 1 drop into both eyes 2 (two) times daily as needed (irritation). (Patient not taking: Reported on 12/10/2022)     cetirizine (ZYRTEC) 10 MG tablet Take 1 tablet (10 mg total) by mouth daily. (Patient not taking: Reported on 12/10/2022) 30 tablet 6   clobetasol (TEMOVATE) 0.05 % external solution APPLY TO THE AFFECTED AREA EVERY DAY (Patient not taking: Reported on 12/10/2022) 50 mL 4   clobetasol cream (TEMOVATE) 0.05 % Apply 1 Application topically 2 (two) times daily. Dr. hall (Patient not taking:  Reported on 12/10/2022)     furosemide (LASIX) 20 MG tablet 1 tab po twice a day as needed for LE swelling (Patient not taking: Reported on 12/10/2022) 60 tablet 1   ipratropium-albuterol (DUONEB) 0.5-2.5 (3) MG/3ML SOLN Inhale 3 mLs into the lungs every 6 (six) hours as needed. (Patient not taking: Reported on 11/12/2022)     lidocaine (XYLOCAINE) 2 % solution 5 ml po  swish and spit tid prior to eating (Patient not taking: Reported on 12/10/2022) 100 mL 2   Lifitegrast (XIIDRA) 5 % SOLN Place 1 drop into both eyes in the morning and at bedtime. (Patient not taking: Reported on 12/10/2022)     pantoprazole (PROTONIX) 40 MG tablet Take 1 tablet (40 mg total) by mouth 2 (two) times daily. (Patient not taking: Reported on 12/10/2022) 180 tablet 1   roflumilast (DALIRESP) 500 MCG TABS tablet Take 1 tablet (500 mcg total) by mouth daily. (Patient not taking: Reported on 12/10/2022) 90 tablet 3   No facility-administered medications prior to visit.    Allergies  Allergen Reactions   Losartan Other (See Comments)    hyperkalemia   Penicillins Hives, Swelling and Rash    Has patient had a PCN reaction causing immediate rash, facial/tongue/throat swelling, SOB or lightheadedness with hypotension: YES Has patient had a PCN reaction causing severe rash involving mucus membranes or skin necrosis: NO Has patient had a PCN reaction that required hospitalization NO Has patient had a PCN reaction occurring within the last 10 years: NO If all of the above answers are "NO", then may proceed with Cephalosporin use.    Cefixime [Kdc:Cefixime] Other (See Comments)    unspecified   Gabapentin Other (See Comments)    Feels woozy   Indocin [Indomethacin] Other (See Comments)    Painful tongue and throat   Singulair [Montelukast Sodium]     Chest tightness, decreased mental clarity   Sulfa Antibiotics Other (See Comments)    unspecified   Ace Inhibitors Other (See Comments)    cough   Statins Other (See Comments)     Myalgias     Review of Systems As per HPI  PE:    12/10/2022    3:57 PM 12/10/2022    3:12 PM 11/12/2022   10:39 AM  Vitals with BMI  Height  5\' 2"    Weight  120 lbs 3 oz   BMI  21.98   Systolic 190 183 161  Diastolic 90 76 60  Pulse  63      Physical Exam Gen: Alert, well appearing.  Patient is oriented to person, place, time, and situation. AFFECT: pleasant, lucid thought and speech. CV: RRR, no m/r Chest is clear, no wheezing or rales. Normal symmetric air entry throughout both lung fields. No chest wall deformities or tenderness. EXT: no clubbing or cyanosis.  no edema.   LABS:  Last thyroid functions Lab Results  Component Value Date   TSH 23.11 (H) 11/12/2022   T3TOTAL 96 09/11/2022   T4TOTAL 12.4 07/11/2010   IMPRESSION AND PLAN:  #1 medication management. Confirmed/clarified that she no longer should be taking diazepam. She has weaned off of this medication correctly.  2.  Hypertension, elevated blood pressure here today. We had gotten her to where she did not need any blood pressure medication but we will restart cautiously -->amlodipine 5 mg a day. Restart regular home BP monitoring. Of note, her cuff at home is apparently a little too big. They will go to the fire department for the next few days and get blood pressure checked.  I will see her back in a week.  #3 dementia with behavioral disturbance. She gets 24/7 care by her boyfriend Fayrene Fearing.  An After Visit Summary was printed and given to the patient.  FOLLOW UP: Return in about 1 week (around 12/17/2022) for f/u bp.  Signed:  Santiago Bumpers, MD  12/10/2022  

## 2022-12-11 NOTE — Addendum Note (Signed)
Addended by: Emi Holes D on: 12/11/2022 11:59 AM   Modules accepted: Orders

## 2022-12-13 DIAGNOSIS — J984 Other disorders of lung: Secondary | ICD-10-CM | POA: Diagnosis not present

## 2022-12-13 DIAGNOSIS — Z96641 Presence of right artificial hip joint: Secondary | ICD-10-CM | POA: Diagnosis not present

## 2022-12-13 DIAGNOSIS — R0902 Hypoxemia: Secondary | ICD-10-CM | POA: Diagnosis not present

## 2022-12-13 DIAGNOSIS — J449 Chronic obstructive pulmonary disease, unspecified: Secondary | ICD-10-CM | POA: Diagnosis not present

## 2022-12-18 ENCOUNTER — Ambulatory Visit: Payer: Medicare PPO | Admitting: Family Medicine

## 2022-12-18 NOTE — Progress Notes (Deleted)
OFFICE VISIT  12/18/2022  CC: No chief complaint on file.   Patient is a 87 y.o. female who presents for 1 week follow-up uncontrolled hypertension. A/P as of last visit: "#1 medication management. Confirmed/clarified that she no longer should be taking diazepam. She has weaned off of this medication correctly.   2.  Hypertension, elevated blood pressure here today. We had gotten her to where she did not need any blood pressure medication but we will restart cautiously -->amlodipine 5 mg a day. Restart regular home BP monitoring. Of note, her cuff at home is apparently a little too big. They will go to the fire department for the next few days and get blood pressure checked.  I will see her back in a week.   #3 dementia with behavioral disturbance. She gets 24/7 care by her boyfriend Fayrene Fearing."  INTERIM HX: ***   Past Medical History:  Diagnosis Date   Allergic rhinitis    with upper airway cough: as of 07/2017 pulm f/u she was instructed to use astelin, flonase, and saline more consistently   BPPV (benign paroxysmal positional vertigo) 03/2018   C. difficile colitis    Chest pain, non-cardiac    Cardiac CT showed no signif obstructive dz, mildly elevated calcium level (Dr. Jens Som).   Chronic cystitis with hematuria    Dr. Lenon Curt   Chronic hypoxemic respiratory failure (HCC) 02/02/2015   2L oxygen 24/7   Chronic renal insufficiency, stage 2 (mild)    GFR approx 60   Cold hands    NCS/EMGs normal.  Hand dysesthesias per neuro--reassured   COPD (chronic obstructive pulmonary disease) (HCC)    GOLD 4.  spirometry 01/10/09 FEV 0.97(52%), FEV1% 47.  With chronic bronchitis as of 07/2017.   DDD (degenerative disc disease), cervical    1998 MRI C5-6 impingemt (left).  MRI 2021 (neurol) 1 level canal stenosis, 1 level foraminal stenosis--->PT   DDD (degenerative disc disease), lumbar    Depression with anxiety 04/15/2011   Diarrheal disease Summer 2017   ? refractor C  diff vs post infectious diarrhea predominant syndrome--GI told her to avoid lactose, sorbitol, and caffeine (08/30/15--Dr. Dr Kinnie Scales).  As of 10/05/15 pt reports GI dx'd her with C diff and rx'd more flagyl and she is also on cholestyramine.  As of 02/2016, pt's sx's resolved completely.   Diverticulosis of colon    Procto '97 and colonoscopy 2002   Dysplastic nevus of upper extremity 04/2014   R tricep (Dr. Jorja Loa)   Edema of both lower extremities due to peripheral venous insufficiency    Varicosities bilat, swelling L>R.  R baker's cyst.  Got vein clinic eval 02/2018->sclerotherapy recommended but since no hemorrhage or ulceration, insurance will not cover the procedure.   Family history of adverse reaction to anesthesia    mother died when patient age 41 receiving anesthesia for thyroid surgery   Fibrocystic breast disease    w/fibroadenoma.  Bx's showed NO atypia (Dr. Jamey Ripa).  Mammo neg 2008.   GERD (gastroesophageal reflux disease)    History of double vision    Ophthalmologist, Dr. Elmer Picker, is further evaluating this with MRI  orbits and limited brain (myesthenia gravis testing neg 07/2010)   History of home oxygen therapy    uses 2 liters at hs   Hypertension    EKG 03/2010 normal   Hypothyroidism    Hashimoto's, dx'd 1989   Idiopathic peripheral neuropathy 04/2018   Sensory (numb feet) S1 distribution bilat, c/w spinal stenosis sensory neuropathy (no pain). 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  Labs Anselmo, MRI C spine->some spondylosis/stenosis.  UE NCS/EMS: normal 04/2019.   Lesion of right native kidney 2017   found on CT 02/2015.  F/u MRI 03/31/15 showed it to be smaller and less likely of any significance but repeat CT renal protocol in 85mo was recommended by radiology.  This was done 10/26/15 and showed no interval change in the lesion, but b/c the lesion enhances with contrast, radiol rec'd urol consult (b/c pap renal RCC  could not be excluded).  Saw urol 03/06/16--stable on u/s 09/2016 and 09/2017.   OAB (overactive bladder)    Osteoarthritis, hip, bilateral    she is s/p bilat THA as of 02/2018.   Osteoporosis    radius, T score -2.5->rec'd alendronate 08/2019->plan rpt DEXA 08/2021.   Pneumonia    Postmenopausal atrophic vaginitis    improved with estrace 2 X/week.   Prolapse of vaginal cuff after hysterectomy    10/2018: Dr. Katherina Right. Mild colpocele 04/2019 per GYN (no surgery)   Pulmonary nodule    CT chest 12/10, June 2011.  No nodule on CT 06/2010.   Recurrent Clostridium difficile diarrhea    Episode 09/19/2016: resolved with prolonged course of flagyl.  11/2016 recurrence treated with 10d of Dificid (Fidaxomicin) by GI (Dr. Kinnie Scales).   Recurrent UTI    likely related to pt's atrophic vaginitis.  Urol started vaginal estrace 03/2016.   Rib fracture 09/2018   R 7th, laterally-->sustained when her dog pulled her over and she fell.   RLS (restless legs syndrome)    neuro->gabapentin trial 2020/21   Shingles 02/2014   R abd/flank   Spinal stenosis of lumbar region with neurogenic claudication    2008 MRI (L3-4, L4-5, L5-S1).  +S1 distribution sensory loss bilat    Past Surgical History:  Procedure Laterality Date   ABDOMINAL HYSTERECTOMY  1978   no BSO per pt--nonmalignant reasons   APPENDECTOMY     BREAST CYST EXCISION     Last screening mammogram 10/2010 was normal.   CHOLECYSTECTOMY  2001   COLONOSCOPY  04/2005; 12/06/2015   2007 NORMAL.  2017 adenomatous polyp x 1.  Mod sigmoid diverticulosis (Dr. Kinnie Scales).     DEXA  05/18/2017; 08/7019   2019 NORMAL (T-score 0.1).  08/2019->T score -2.5->alendronate started.  03/2022 T score -2.7   HIP CLOSED REDUCTION Right 12/25/2015   Procedure: CLOSED REDUCTION HIP;  Surgeon: Yolonda Kida, MD;  Location: [facility];  Service: Orthopedics;  Laterality: Right;   NCV/EMS  10/2018   chronic and symmetric sensorimotor axonal polyneuropathy affecting the lower  extremities   TONSILLECTOMY     TOTAL HIP ARTHROPLASTY Right 11/01/2014   Procedure: RIGHT TOTAL HIP ARTHROPLASTY;  Surgeon: Ranee Gosselin, MD;  Location: [facility];  Service: Orthopedics;  Laterality: Right;   TOTAL HIP ARTHROPLASTY Left 12/08/2017   Procedure: LEFT TOTAL HIP ARTHROPLASTY ANTERIOR APPROACH;  Surgeon: Durene Romans, MD;  Location: [facility];  Service: Orthopedics;  Laterality: Left;  70 mins   TRANSTHORACIC ECHOCARDIOGRAM  08/2014   2016 EF 60-65%, grade I diast dysfxn. 06/21/21 unchanged.   TUBAL LIGATION  1974    Outpatient Medications Prior to Visit  Medication Sig Dispense Refill   albuterol (PROAIR HFA) 108 (90 Base) MCG/ACT inhaler Inhale 2 puffs into the lungs every 4 (four) hours as needed for wheezing or shortness of breath. (Patient not taking: Reported on 12/10/2022) 8.5 g 1   albuterol (PROVENTIL) (2.5 MG/3ML) 0.083% nebulizer solution  Take 3 mLs (2.5 mg total) by nebulization every 6 (six) hours as needed for wheezing or shortness of breath. (Patient not taking: Reported on 11/12/2022) 360 mL 5   alendronate (FOSAMAX) 70 MG tablet TAKE 1 TABLET(70 MG) BY MOUTH 1 TIME A WEEK WITH A FULL GLASS OF WATER AND ON AN EMPTY STOMACH (Patient not taking: Reported on 12/10/2022) 12 tablet 3   amLODipine (NORVASC) 5 MG tablet Take 1 tablet (5 mg total) by mouth daily. 30 tablet 0   Artificial Tear Solution (SOOTHE XP) SOLN Place 1 drop into both eyes 2 (two) times daily as needed (irritation). (Patient not taking: Reported on 12/10/2022)     azelastine (ASTELIN) 0.1 % nasal spray Place 2 sprays into both nostrils 2 (two) times daily. Use in each nostril as directed 30 mL 3   cetirizine (ZYRTEC) 10 MG tablet Take 1 tablet (10 mg total) by mouth daily. (Patient not taking: Reported on 12/10/2022) 30 tablet 6   clobetasol (TEMOVATE) 0.05 % external solution APPLY TO THE AFFECTED AREA EVERY DAY (Patient not taking: Reported on 12/10/2022) 50 mL 4   clobetasol cream (TEMOVATE) 0.05 % Apply 1  Application topically 2 (two) times daily. Dr. hall (Patient not taking: Reported on 12/10/2022)     cycloSPORINE (RESTASIS OP) Apply to eye as needed.     fluticasone (FLONASE) 50 MCG/ACT nasal spray Place 2 sprays into both nostrils daily. 16 g 6   Fluticasone-Umeclidin-Vilant (TRELEGY ELLIPTA) 100-62.5-25 MCG/ACT AEPB Inhale 1 puff into the lungs daily. 180 each 3   furosemide (LASIX) 20 MG tablet 1 tab po twice a day as needed for LE swelling (Patient not taking: Reported on 12/10/2022) 60 tablet 1   ipratropium-albuterol (DUONEB) 0.5-2.5 (3) MG/3ML SOLN Inhale 3 mLs into the lungs every 6 (six) hours as needed. (Patient not taking: Reported on 11/12/2022)     levothyroxine (SYNTHROID) 100 MCG tablet 1 tab po qd 30 tablet 1   lidocaine (XYLOCAINE) 2 % solution 5 ml po swish and spit tid prior to eating (Patient not taking: Reported on 12/10/2022) 100 mL 2   Lifitegrast (XIIDRA) 5 % SOLN Place 1 drop into both eyes in the morning and at bedtime. (Patient not taking: Reported on 12/10/2022)     Misc. Devices (PULSE OXIMETER) MISC Check oxygen level as needed 1 each 1   pantoprazole (PROTONIX) 40 MG tablet Take 1 tablet (40 mg total) by mouth 2 (two) times daily. (Patient not taking: Reported on 12/10/2022) 180 tablet 1   roflumilast (DALIRESP) 500 MCG TABS tablet Take 1 tablet (500 mcg total) by mouth daily. (Patient not taking: Reported on 12/10/2022) 90 tablet 3   vitamin B-12 (CYANOCOBALAMIN) 100 MCG tablet Take 100 mcg by mouth daily.     VITAMIN D PO Take 1 tablet by mouth daily.     No facility-administered medications prior to visit.    Allergies  Allergen Reactions   Losartan Other (See Comments)    hyperkalemia   Penicillins Hives, Swelling and Rash    Has patient had a PCN reaction causing immediate rash, facial/tongue/throat swelling, SOB or lightheadedness with hypotension: YES Has patient had a PCN reaction causing severe rash involving mucus membranes or skin necrosis: NO Has  patient had a PCN reaction that required hospitalization NO Has patient had a PCN reaction occurring within the last 10 years: NO If all of the above answers are "NO", then may proceed with Cephalosporin use.    Cefixime [Kdc:Cefixime] Other (See Comments)  unspecified   Gabapentin Other (See Comments)    Feels woozy   Indocin [Indomethacin] Other (See Comments)    Painful tongue and throat   Singulair [Montelukast Sodium]     Chest tightness, decreased mental clarity   Sulfa Antibiotics Other (See Comments)    unspecified   Ace Inhibitors Other (See Comments)    cough   Statins Other (See Comments)    Myalgias     Review of Systems As per HPI  PE:    12/10/2022    3:57 PM 12/10/2022    3:12 PM 11/12/2022   10:39 AM  Vitals with BMI  Height  5\' 2"    Weight  120 lbs 3 oz   BMI  21.98   Systolic 190 183 409  Diastolic 90 76 60  Pulse  63      Physical Exam  ***  LABS:  Last metabolic panel Lab Results  Component Value Date   GLUCOSE 100 (H) 05/17/2022   NA 142 05/17/2022   K 3.5 05/17/2022   CL 103 05/17/2022   CO2 30 05/17/2022   BUN 15 05/17/2022   CREATININE 0.81 05/17/2022   GFRNONAA >60 05/17/2022   CALCIUM 9.4 05/17/2022   PHOS 3.8 01/31/2013   PROT 6.1 (L) 10/29/2021   ALBUMIN 3.1 (L) 10/29/2021   BILITOT 0.4 10/29/2021   ALKPHOS 58 10/29/2021   AST 18 10/29/2021   ALT 42 10/29/2021   ANIONGAP 9 05/17/2022   Lab Results  Component Value Date   TSH 23.11 (H) 11/12/2022   IMPRESSION AND PLAN:  No problem-specific Assessment & Plan notes found for this encounter.  Hypothyroidism, TSH has been up and down lately.  I think there has been some problems with medication compliance. 5 weeks ago TSH was elevated so I increased her levothyroxine to 1 whole 100 mcg tab every day. TSH today***.  An After Visit Summary was printed and given to the patient.  FOLLOW UP: No follow-ups on file.  Signed:  Santiago Bumpers, MD           12/18/2022

## 2022-12-25 ENCOUNTER — Telehealth: Payer: Self-pay

## 2022-12-25 ENCOUNTER — Other Ambulatory Visit: Payer: Medicare PPO

## 2022-12-25 DIAGNOSIS — E039 Hypothyroidism, unspecified: Secondary | ICD-10-CM | POA: Diagnosis not present

## 2022-12-25 NOTE — Telephone Encounter (Signed)
Patient is calling to check on the status of her refill  for Albuterol Sulfate--- She is a former Dr. Craige Cotta patient [facility] in [place], [place]  [phone]----[id number] call back [phone]

## 2022-12-25 NOTE — Telephone Encounter (Signed)
Patient needs refill of albuterol sulphate   Pharmacy: [facility] in [place]

## 2022-12-26 LAB — T4, FREE: Free T4: 1.6 ng/dL (ref 0.8–1.8)

## 2022-12-26 LAB — TSH: TSH: 1.04 m[IU]/L (ref 0.40–4.50)

## 2022-12-26 LAB — T3: T3, Total: 90 ng/dL (ref 76–181)

## 2022-12-26 NOTE — Telephone Encounter (Signed)
Attempted to call patient.  Recording stating call cannot go through.  Called x 3.

## 2022-12-28 DIAGNOSIS — J189 Pneumonia, unspecified organism: Secondary | ICD-10-CM | POA: Diagnosis not present

## 2022-12-30 DIAGNOSIS — R0902 Hypoxemia: Secondary | ICD-10-CM | POA: Diagnosis not present

## 2022-12-30 DIAGNOSIS — J449 Chronic obstructive pulmonary disease, unspecified: Secondary | ICD-10-CM | POA: Diagnosis not present

## 2022-12-30 DIAGNOSIS — Z96641 Presence of right artificial hip joint: Secondary | ICD-10-CM | POA: Diagnosis not present

## 2022-12-30 DIAGNOSIS — J984 Other disorders of lung: Secondary | ICD-10-CM | POA: Diagnosis not present

## 2022-12-30 MED ORDER — ALBUTEROL SULFATE HFA 108 (90 BASE) MCG/ACT IN AERS: 2.0000 | INHALATION_SPRAY | RESPIRATORY_TRACT | 1 refills | Status: DC | PRN

## 2022-12-30 NOTE — Telephone Encounter (Deleted)
Attempted to call patient at number given by patient.  Recording stating call cannot go through.  Called x 4.  Will send MyChart message and close encounter.

## 2023-01-06 ENCOUNTER — Other Ambulatory Visit: Payer: Self-pay | Admitting: Family Medicine

## 2023-01-06 ENCOUNTER — Encounter: Payer: Self-pay | Admitting: Family Medicine

## 2023-01-12 DIAGNOSIS — R0902 Hypoxemia: Secondary | ICD-10-CM | POA: Diagnosis not present

## 2023-01-12 DIAGNOSIS — Z96641 Presence of right artificial hip joint: Secondary | ICD-10-CM | POA: Diagnosis not present

## 2023-01-12 DIAGNOSIS — J984 Other disorders of lung: Secondary | ICD-10-CM | POA: Diagnosis not present

## 2023-01-12 DIAGNOSIS — J449 Chronic obstructive pulmonary disease, unspecified: Secondary | ICD-10-CM | POA: Diagnosis not present

## 2023-01-14 ENCOUNTER — Telehealth: Payer: Self-pay

## 2023-01-14 NOTE — Telephone Encounter (Signed)
PAP application for [facility]  has been mailed to pt home. I will fax PCP pages once I receive pt pages

## 2023-01-14 NOTE — Telephone Encounter (Signed)
Application has been received and in providers in box.

## 2023-01-15 DIAGNOSIS — R197 Diarrhea, unspecified: Secondary | ICD-10-CM | POA: Diagnosis not present

## 2023-01-15 DIAGNOSIS — J020 Streptococcal pharyngitis: Secondary | ICD-10-CM | POA: Diagnosis not present

## 2023-01-15 NOTE — Telephone Encounter (Signed)
Placed in PCP office.

## 2023-01-23 DIAGNOSIS — R0602 Shortness of breath: Secondary | ICD-10-CM | POA: Diagnosis not present

## 2023-01-23 DIAGNOSIS — F411 Generalized anxiety disorder: Secondary | ICD-10-CM | POA: Diagnosis not present

## 2023-01-29 DIAGNOSIS — J984 Other disorders of lung: Secondary | ICD-10-CM | POA: Diagnosis not present

## 2023-01-29 DIAGNOSIS — R0902 Hypoxemia: Secondary | ICD-10-CM | POA: Diagnosis not present

## 2023-01-29 DIAGNOSIS — Z96641 Presence of right artificial hip joint: Secondary | ICD-10-CM | POA: Diagnosis not present

## 2023-01-29 DIAGNOSIS — J449 Chronic obstructive pulmonary disease, unspecified: Secondary | ICD-10-CM | POA: Diagnosis not present

## 2023-02-08 DIAGNOSIS — R0602 Shortness of breath: Secondary | ICD-10-CM | POA: Diagnosis not present

## 2023-02-08 DIAGNOSIS — Z03818 Encounter for observation for suspected exposure to other biological agents ruled out: Secondary | ICD-10-CM | POA: Diagnosis not present

## 2023-02-09 ENCOUNTER — Emergency Department (HOSPITAL_BASED_OUTPATIENT_CLINIC_OR_DEPARTMENT_OTHER): Payer: Medicare HMO | Admitting: Radiology

## 2023-02-09 ENCOUNTER — Inpatient Hospital Stay (HOSPITAL_BASED_OUTPATIENT_CLINIC_OR_DEPARTMENT_OTHER)
Admission: EM | Admit: 2023-02-09 | Discharge: 2023-02-17 | DRG: 467 | Disposition: A | Discharge status: Skilled Nursing Facility | Payer: Medicare HMO | Attending: Student | Admitting: Student

## 2023-02-09 ENCOUNTER — Encounter (HOSPITAL_BASED_OUTPATIENT_CLINIC_OR_DEPARTMENT_OTHER): Payer: Self-pay | Admitting: Emergency Medicine

## 2023-02-09 DIAGNOSIS — G609 Hereditary and idiopathic neuropathy, unspecified: Secondary | ICD-10-CM | POA: Diagnosis present

## 2023-02-09 DIAGNOSIS — J4489 Other specified chronic obstructive pulmonary disease: Secondary | ICD-10-CM | POA: Diagnosis present

## 2023-02-09 DIAGNOSIS — I129 Hypertensive chronic kidney disease with stage 1 through stage 4 chronic kidney disease, or unspecified chronic kidney disease: Secondary | ICD-10-CM | POA: Diagnosis present

## 2023-02-09 DIAGNOSIS — I872 Venous insufficiency (chronic) (peripheral): Secondary | ICD-10-CM | POA: Diagnosis present

## 2023-02-09 DIAGNOSIS — Z96642 Presence of left artificial hip joint: Secondary | ICD-10-CM | POA: Diagnosis present

## 2023-02-09 DIAGNOSIS — M25351 Other instability, right hip: Secondary | ICD-10-CM | POA: Diagnosis not present

## 2023-02-09 DIAGNOSIS — G2581 Restless legs syndrome: Secondary | ICD-10-CM | POA: Diagnosis present

## 2023-02-09 DIAGNOSIS — Z87891 Personal history of nicotine dependence: Secondary | ICD-10-CM

## 2023-02-09 DIAGNOSIS — Z8249 Family history of ischemic heart disease and other diseases of the circulatory system: Secondary | ICD-10-CM

## 2023-02-09 DIAGNOSIS — Z7951 Long term (current) use of inhaled steroids: Secondary | ICD-10-CM

## 2023-02-09 DIAGNOSIS — R2681 Unsteadiness on feet: Secondary | ICD-10-CM | POA: Diagnosis not present

## 2023-02-09 DIAGNOSIS — N182 Chronic kidney disease, stage 2 (mild): Secondary | ICD-10-CM | POA: Diagnosis present

## 2023-02-09 DIAGNOSIS — Z880 Allergy status to penicillin: Secondary | ICD-10-CM

## 2023-02-09 DIAGNOSIS — Z7401 Bed confinement status: Secondary | ICD-10-CM | POA: Diagnosis not present

## 2023-02-09 DIAGNOSIS — S73004A Unspecified dislocation of right hip, initial encounter: Principal | ICD-10-CM | POA: Diagnosis present

## 2023-02-09 DIAGNOSIS — E039 Hypothyroidism, unspecified: Secondary | ICD-10-CM | POA: Diagnosis present

## 2023-02-09 DIAGNOSIS — J449 Chronic obstructive pulmonary disease, unspecified: Secondary | ICD-10-CM | POA: Diagnosis not present

## 2023-02-09 DIAGNOSIS — Z471 Aftercare following joint replacement surgery: Secondary | ICD-10-CM | POA: Diagnosis not present

## 2023-02-09 DIAGNOSIS — Z9049 Acquired absence of other specified parts of digestive tract: Secondary | ICD-10-CM

## 2023-02-09 DIAGNOSIS — T84020A Dislocation of internal right hip prosthesis, initial encounter: Principal | ICD-10-CM | POA: Diagnosis present

## 2023-02-09 DIAGNOSIS — J9611 Chronic respiratory failure with hypoxia: Secondary | ICD-10-CM | POA: Diagnosis present

## 2023-02-09 DIAGNOSIS — F32A Depression, unspecified: Secondary | ICD-10-CM | POA: Diagnosis present

## 2023-02-09 DIAGNOSIS — G4700 Insomnia, unspecified: Secondary | ICD-10-CM | POA: Diagnosis present

## 2023-02-09 DIAGNOSIS — R0902 Hypoxemia: Secondary | ICD-10-CM | POA: Diagnosis not present

## 2023-02-09 DIAGNOSIS — Z87440 Personal history of urinary (tract) infections: Secondary | ICD-10-CM

## 2023-02-09 DIAGNOSIS — M6281 Muscle weakness (generalized): Secondary | ICD-10-CM | POA: Diagnosis not present

## 2023-02-09 DIAGNOSIS — F03A4 Unspecified dementia, mild, with anxiety: Secondary | ICD-10-CM | POA: Diagnosis present

## 2023-02-09 DIAGNOSIS — Z96643 Presence of artificial hip joint, bilateral: Secondary | ICD-10-CM | POA: Diagnosis not present

## 2023-02-09 DIAGNOSIS — Z882 Allergy status to sulfonamides status: Secondary | ICD-10-CM

## 2023-02-09 DIAGNOSIS — R531 Weakness: Secondary | ICD-10-CM | POA: Diagnosis not present

## 2023-02-09 DIAGNOSIS — Z79899 Other long term (current) drug therapy: Secondary | ICD-10-CM

## 2023-02-09 DIAGNOSIS — M24451 Recurrent dislocation, right hip: Secondary | ICD-10-CM | POA: Diagnosis not present

## 2023-02-09 DIAGNOSIS — K219 Gastro-esophageal reflux disease without esophagitis: Secondary | ICD-10-CM | POA: Diagnosis present

## 2023-02-09 DIAGNOSIS — F0390 Unspecified dementia without behavioral disturbance: Secondary | ICD-10-CM | POA: Diagnosis not present

## 2023-02-09 DIAGNOSIS — Z7983 Long term (current) use of bisphosphonates: Secondary | ICD-10-CM | POA: Diagnosis not present

## 2023-02-09 DIAGNOSIS — M25551 Pain in right hip: Secondary | ICD-10-CM | POA: Diagnosis not present

## 2023-02-09 DIAGNOSIS — Z96641 Presence of right artificial hip joint: Secondary | ICD-10-CM | POA: Diagnosis not present

## 2023-02-09 DIAGNOSIS — Z79890 Hormone replacement therapy (postmenopausal): Secondary | ICD-10-CM | POA: Diagnosis not present

## 2023-02-09 DIAGNOSIS — Z9981 Dependence on supplemental oxygen: Secondary | ICD-10-CM

## 2023-02-09 DIAGNOSIS — Y831 Surgical operation with implant of artificial internal device as the cause of abnormal reaction of the patient, or of later complication, without mention of misadventure at the time of the procedure: Secondary | ICD-10-CM | POA: Diagnosis present

## 2023-02-09 DIAGNOSIS — R1310 Dysphagia, unspecified: Secondary | ICD-10-CM | POA: Diagnosis not present

## 2023-02-09 DIAGNOSIS — S73004D Unspecified dislocation of right hip, subsequent encounter: Secondary | ICD-10-CM | POA: Diagnosis not present

## 2023-02-09 DIAGNOSIS — R41841 Cognitive communication deficit: Secondary | ICD-10-CM | POA: Diagnosis not present

## 2023-02-09 DIAGNOSIS — Z888 Allergy status to other drugs, medicaments and biological substances status: Secondary | ICD-10-CM

## 2023-02-09 DIAGNOSIS — J309 Allergic rhinitis, unspecified: Secondary | ICD-10-CM | POA: Diagnosis present

## 2023-02-09 DIAGNOSIS — Z90710 Acquired absence of both cervix and uterus: Secondary | ICD-10-CM

## 2023-02-09 DIAGNOSIS — I10 Essential (primary) hypertension: Secondary | ICD-10-CM | POA: Diagnosis not present

## 2023-02-09 DIAGNOSIS — H8110 Benign paroxysmal vertigo, unspecified ear: Secondary | ICD-10-CM | POA: Diagnosis present

## 2023-02-09 DIAGNOSIS — R262 Difficulty in walking, not elsewhere classified: Secondary | ICD-10-CM | POA: Diagnosis not present

## 2023-02-09 MED ORDER — PROPOFOL 10 MG/ML IV BOLUS
0.5000 mg/kg | Freq: Once | INTRAVENOUS | Status: DC
  Filled 2023-02-09: qty 20

## 2023-02-09 MED ORDER — SODIUM CHLORIDE 0.9 % IV BOLUS
500.0000 mL | Freq: Once | INTRAVENOUS | Status: AC
  Administered 2023-02-09: 500 mL via INTRAVENOUS

## 2023-02-09 MED ORDER — FENTANYL CITRATE PF 50 MCG/ML IJ SOSY
50.0000 ug | PREFILLED_SYRINGE | INTRAMUSCULAR | Status: DC | PRN
  Administered 2023-02-09: 50 ug via INTRAVENOUS
  Filled 2023-02-09 (×2): qty 1

## 2023-02-09 MED ORDER — PROPOFOL 10 MG/ML IV BOLUS
INTRAVENOUS | Status: AC | PRN
  Administered 2023-02-09 (×4): 30 mg via INTRAVENOUS

## 2023-02-09 MED ORDER — FENTANYL CITRATE (PF) 100 MCG/2ML IJ SOLN
INTRAMUSCULAR | Status: AC | PRN
  Administered 2023-02-09: 50 ug via INTRAVENOUS

## 2023-02-09 NOTE — ED Notes (Signed)
 SpO2 decreased to 84%. Patient ventilated with AMBU bag for approx. 2 minutes. SpO2 following BVM 95%.

## 2023-02-09 NOTE — Sedation Documentation (Signed)
 During procedural sedation, main monitor shut off and would not restart. Pt placed on transport monitor quickly for remaining of the procedure. End tidal CO2 monitoring restarted at 2154.

## 2023-02-09 NOTE — ED Triage Notes (Signed)
 Right hip out of socket bend down and felt hip pop Hx of similar  Binder in place by ems 150mg  fent 4mg  zofran by ems Ems vS 176/86 90 94% 3 L o2 normally on 2 L

## 2023-02-09 NOTE — ED Notes (Signed)
 Pt bent over and felt her rt. Knee "pop" Hx of dislocation before.  Pt BIB EMS, has IV in place and fentanyl given in route

## 2023-02-09 NOTE — ED Notes (Signed)
 X-ray at bedside

## 2023-02-09 NOTE — ED Notes (Signed)
 Patient placed on ETCO2 monitoring and cardiac monitoring at this time. Suction and AMBU bag at head of bed. Patient on [place] at baseline. Literflow increased to 4L/min at this time prior to procedural sedation.

## 2023-02-09 NOTE — ED Provider Notes (Addendum)
 [place] EMERGENCY DEPARTMENT AT [facility] Provider Note   CSN: [id number] Arrival date & time: 02/09/23  2007     History  Chief Complaint  Patient presents with   Hip Pain    Priscilla Cantrell is a 88 y.o. female.   Hip Pain     88 year old female with medical history significant for recurrent hip dislocations who presents to the emergency department with hip pain.  The patient states that she was lying down on the couch and bent over to pick something up off the floor when she felt her hip dislocate.  She felt a pop.  She most recently was seen in the emergency department on 10/20/2022 with a similar complaint of right hip dislocation and was able to be relocated after sedation.  No falls or head trauma, she is not on anticoagulation.  Home Medications Prior to Admission medications   Medication Sig Start Date End Date Taking? Authorizing Provider  albuterol  (PROAIR  HFA) 108 (90 Base) MCG/ACT inhaler Inhale 2 puffs into the lungs every 4 (four) hours as needed for wheezing or shortness of breath. 12/30/22   Mannam, Praveen, MD  albuterol  (PROVENTIL ) (2.5 MG/3ML) 0.083% nebulizer solution Take 3 mLs (2.5 mg total) by nebulization every 6 (six) hours as needed for wheezing or shortness of breath. Patient not taking: Reported on 11/12/2022 09/09/21   Shellia Oh, MD  alendronate  (FOSAMAX ) 70 MG tablet TAKE 1 TABLET(70 MG) BY MOUTH 1 TIME A WEEK WITH A FULL GLASS OF WATER  AND ON AN EMPTY STOMACH Patient not taking: Reported on 12/10/2022 09/10/20   McGowen, Philip H, MD  amLODipine  (NORVASC ) 5 MG tablet TAKE 1 TABLET(5 MG) BY MOUTH DAILY 01/06/23   McGowen, Aleene DEL, MD  Artificial Tear Solution (SOOTHE XP) SOLN Place 1 drop into both eyes 2 (two) times daily as needed (irritation). Patient not taking: Reported on 12/10/2022    [provider]  azelastine  (ASTELIN ) 0.1 % nasal spray Place 2 sprays into both nostrils 2 (two) times daily. Use in each nostril as directed  03/03/22   McGowen, Aleene DEL, MD  cetirizine  (ZYRTEC ) 10 MG tablet Take 1 tablet (10 mg total) by mouth daily. Patient not taking: Reported on 12/10/2022 05/22/20   Candise Aleene DEL, MD  clobetasol  (TEMOVATE ) 0.05 % external solution APPLY TO THE AFFECTED AREA EVERY DAY Patient not taking: Reported on 12/10/2022 03/08/20   Sheffield, Kelli R, PA-C  clobetasol  cream (TEMOVATE ) 0.05 % Apply 1 Application topically 2 (two) times daily. Dr. hall Patient not taking: Reported on 12/10/2022    [provider]  cycloSPORINE  (RESTASIS  OP) Apply to eye as needed.    [provider]  fluticasone  (FLONASE ) 50 MCG/ACT nasal spray Place 2 sprays into both nostrils daily. 05/22/20   McGowen, Aleene DEL, MD  Fluticasone -Umeclidin-Vilant (TRELEGY ELLIPTA ) 100-62.5-25 MCG/ACT AEPB Inhale 1 puff into the lungs daily. 05/19/22   Sood, [name], MD  furosemide  (LASIX ) 20 MG tablet 1 tab po twice a day as needed for LE swelling Patient not taking: Reported on 12/10/2022 03/03/22   McGowen, Philip H, MD  ipratropium-albuterol  (DUONEB) 0.5-2.5 (3) MG/3ML SOLN Inhale 3 mLs into the lungs every 6 (six) hours as needed. Patient not taking: Reported on 11/12/2022 11/01/22   [provider]  levothyroxine  (SYNTHROID ) 100 MCG tablet TAKE 1 TABLET BY MOUTH EVERY DAY 01/06/23   McGowen, Aleene DEL, MD  lidocaine  (XYLOCAINE ) 2 % solution 5 ml po swish and spit tid prior to eating Patient not taking: Reported  on 12/10/2022 09/25/22   McGowen, Philip H, MD  Lifitegrast  (XIIDRA ) 5 % SOLN Place 1 drop into both eyes in the morning and at bedtime. Patient not taking: Reported on 12/10/2022    [provider]  Misc. Devices (PULSE OXIMETER) MISC Check oxygen  level as needed 03/12/22   McGowen, Aleene DEL, MD  pantoprazole  (PROTONIX ) 40 MG tablet Take 1 tablet (40 mg total) by mouth 2 (two) times daily. Patient not taking: Reported on 12/10/2022 11/12/22   McGowen, Philip H, MD  roflumilast  (DALIRESP ) 500 MCG TABS tablet Take  1 tablet (500 mcg total) by mouth daily. Patient not taking: Reported on 12/10/2022 05/31/20   Hope Almarie ORN, NP  vitamin B-12 (CYANOCOBALAMIN ) 100 MCG tablet Take 100 mcg by mouth daily.    [provider]  VITAMIN D  PO Take 1 tablet by mouth daily.    [provider]      Allergies    Losartan , Penicillins, Cefixime [kdc:cefixime], Gabapentin , Indocin [indomethacin], Singulair  [montelukast  sodium], Sulfa antibiotics, Ace inhibitors, and Statins    Review of Systems   Review of Systems  Musculoskeletal:  Positive for arthralgias.  All other systems reviewed and are negative.   Physical Exam Updated Vital Signs BP (!) 142/66   Pulse 69   Temp 98.3 F (36.8 C) (Oral)   Resp 16   Wt 54.4 kg   SpO2 97%   BMI 21.95 kg/m  Physical Exam Vitals and nursing note reviewed.  Constitutional:      General: She is not in acute distress. HENT:     Head: Normocephalic and atraumatic.  Eyes:     Conjunctiva/sclera: Conjunctivae normal.     Pupils: Pupils are equal, round, and reactive to light.  Cardiovascular:     Rate and Rhythm: Normal rate and regular rhythm.  Pulmonary:     Effort: Pulmonary effort is normal. No respiratory distress.  Abdominal:     General: There is no distension.     Tenderness: There is no guarding.  Musculoskeletal:        General: No deformity or signs of injury.     Cervical back: Neck supple.     Comments: Right leg shortened, intact pulse and sensation  Skin:    Findings: No lesion or rash.  Neurological:     General: No focal deficit present.     Mental Status: She is alert. Mental status is at baseline.     ED Results / Procedures / Treatments   Labs (all labs ordered are listed, but only abnormal results are displayed) Labs Reviewed - No data to display  EKG None  Radiology DG HIP PORT UNILAT WITH PELVIS 1V RIGHT Result Date: 02/09/2023 CLINICAL DATA:  Postreduction EXAM: DG HIP (WITH OR WITHOUT PELVIS) 1V PORT RIGHT  COMPARISON:  02/09/2023 FINDINGS: Prior right hip replacement. Continued superior dislocation. No visible fracture. IMPRESSION: Continued superior dislocation of the right hip replacement. Electronically Signed   By: Franky Crease M.D.   On: 02/09/2023 22:07   DG Hip Unilat  With Pelvis 2-3 Views Right Result Date: 02/09/2023 CLINICAL DATA:  Felt in right hip pop, pain EXAM: DG HIP (WITH OR WITHOUT PELVIS) 2-3V RIGHT COMPARISON:  05/29/2022 FINDINGS: Patient is status post bilateral hip replacements. There is superior dislocation of the right hip replacement. No fracture. SI joints symmetric and unremarkable. IMPRESSION: Bilateral hip replacements. Superior dislocation of the right hip replacement. Electronically Signed   By: Franky Crease M.D.   On: 02/09/2023 20:57  Procedures .Sedation  Date/Time: 02/09/2023 10:14 PM  Performed by: Jerrol Agent, MD Authorized by: Jerrol Agent, MD   Consent:    Consent obtained:  Verbal   Consent given by:  Patient   Risks discussed:  Allergic reaction, dysrhythmia, inadequate sedation, nausea, prolonged hypoxia resulting in organ damage, prolonged sedation necessitating reversal, respiratory compromise necessitating ventilatory assistance and intubation and vomiting   Alternatives discussed:  Analgesia without sedation, anxiolysis and regional anesthesia Universal protocol:    Procedure explained and questions answered to patient or proxy's satisfaction: yes     Relevant documents present and verified: yes     Test results available: yes     Imaging studies available: yes     Required blood products, implants, devices, and special equipment available: yes     Site/side marked: yes     Immediately prior to procedure, a time out was called: yes     Patient identity confirmed:  Verbally with patient Indications:    Procedure necessitating sedation performed by:  Physician performing sedation Pre-sedation assessment:    Time since last food or drink:  4  hours   ASA classification: class 3 - patient with severe systemic disease     Mouth opening:  3 or more finger widths   Thyromental distance:  4 finger widths   Mallampati score:  I - soft palate, uvula, fauces, pillars visible   Neck mobility: normal     Pre-sedation assessments completed and reviewed: airway patency, cardiovascular function, hydration status, mental status, nausea/vomiting, pain level, respiratory function and temperature   A pre-sedation assessment was completed prior to the start of the procedure Immediate pre-procedure details:    Reassessment: Patient reassessed immediately prior to procedure     Reviewed: vital signs, relevant labs/tests and NPO status     Verified: bag valve mask available, emergency equipment available, intubation equipment available, IV patency confirmed, oxygen  available and suction available   Procedure details (see MAR for exact dosages):    Preoxygenation:  Nasal cannula   Sedation:  Propofol    Intended level of sedation: deep   Intra-procedure monitoring:  Blood pressure monitoring, cardiac monitor, continuous pulse oximetry, frequent LOC assessments, frequent vital sign checks and continuous capnometry   Intra-procedure events: none     Total Provider sedation time (minutes):  32 Post-procedure details:   A post-sedation assessment was completed following the completion of the procedure.   Attendance: Constant attendance by certified staff until patient recovered     Recovery: Patient returned to pre-procedure baseline     Post-sedation assessments completed and reviewed: airway patency, cardiovascular function, hydration status, mental status, nausea/vomiting, pain level, respiratory function and temperature     Patient is stable for discharge or admission: yes     Procedure completion:  Tolerated well, no immediate complications .Reduction of dislocation  Date/Time: 02/09/2023 10:16 PM  Performed by: Jerrol Agent, MD Authorized by:  Jerrol Agent, MD  Consent: Verbal consent obtained. Written consent obtained. Risks and benefits: risks, benefits and alternatives were discussed Consent given by: patient Required items: required blood products, implants, devices, and special equipment available Patient identity confirmed: verbally with patient and arm band Time out: Immediately prior to procedure a time out was called to verify the correct patient, procedure, equipment, support staff and site/side marked as required. Preparation: Patient was prepped and draped in the usual sterile fashion. Local anesthesia used: no  Anesthesia: Local anesthesia used: no  Sedation: Patient sedated: yes Sedation type: moderate (conscious) sedation Sedatives: propofol  Analgesia:  fentanyl  Sedation start date/time: 02/09/2023 9:32 PM Sedation end date/time: 02/09/2023 10:04 PM Vitals: Vital signs were monitored during sedation.  Comments: Procedure terminated due to patient hypoxia requiring resuscitation by BVM and unsuccessful reduction.       Medications Ordered in ED Medications  fentaNYL  (SUBLIMAZE ) injection 50 mcg (50 mcg Intravenous Given 02/09/23 2055)  propofol  (DIPRIVAN ) 10 mg/mL bolus/IV push 27.2 mg (0 mg Intravenous Not Given 02/09/23 2219)  sodium chloride  0.9 % bolus 500 mL (0 mLs Intravenous Stopped 02/09/23 2205)  propofol  (DIPRIVAN ) 10 mg/mL bolus/IV push (30 mg Intravenous Given 02/09/23 2148)  fentaNYL  (SUBLIMAZE ) injection (50 mcg Intravenous Given 02/09/23 2146)    ED Course/ Medical Decision Making/ A&P                                 Medical Decision Making Amount and/or Complexity of Data Reviewed Radiology: ordered.  Risk Prescription drug management. Decision regarding hospitalization.    88 year old female with medical history significant for recurrent hip dislocations who presents to the emergency department with hip pain.  The patient states that she was lying down on the couch and bent over to pick  something up off the floor when she felt her hip dislocate.  She felt a pop.  She most recently was seen in the emergency department on 10/20/2022 with a similar complaint of right hip dislocation and was able to be relocated after sedation.  No falls or head trauma, she is not on anticoagulation.  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  XR Hip/Pelvis: IMPRESSION:  Bilateral hip replacements. Superior dislocation of the right hip  replacement.    The patient was consented for procedural sedation for reduction of her prosthetic hip dislocation.  Procedural sedation was performed as per the procedure note above with roughly 30 minutes of sedation and multiple attempts at reduction which ultimately were unsuccessful.  The patient had a single desaturation to 84% during the procedure but otherwise no acute events.   Discussed with the patient that attempts at reduction were unsuccessful. [facility] was consulted.  Discussed with Dr. Kay of [facility] who recommended admission to medicine and NPO at midnight with plan for further management in the AM. Hospitalist medicine consulted for admission, Dr. Franky accepting.   Final Clinical Impression(s) / ED Diagnoses Final diagnoses:  Dislocation of right hip, initial encounter [facility])    Rx / DC Orders ED Discharge Orders     None         Jerrol Agent, MD 02/09/23 7672    Jerrol Agent, MD 02/09/23 2329

## 2023-02-09 NOTE — ED Notes (Signed)
 Pt placed on cardiac monitor, end tidal, bp cuff, pulse ox

## 2023-02-10 ENCOUNTER — Other Ambulatory Visit: Payer: Self-pay

## 2023-02-10 ENCOUNTER — Observation Stay (HOSPITAL_COMMUNITY): Payer: Medicare HMO | Admitting: Anesthesiology

## 2023-02-10 ENCOUNTER — Encounter (HOSPITAL_COMMUNITY)
Admission: EM | Disposition: A | Discharge status: Skilled Nursing Facility | Payer: Self-pay | Source: Home / Self Care | Attending: Family Medicine

## 2023-02-10 ENCOUNTER — Observation Stay (HOSPITAL_COMMUNITY): Payer: Medicare HMO

## 2023-02-10 ENCOUNTER — Encounter (HOSPITAL_COMMUNITY): Payer: Self-pay | Admitting: Internal Medicine

## 2023-02-10 DIAGNOSIS — T84020A Dislocation of internal right hip prosthesis, initial encounter: Secondary | ICD-10-CM | POA: Diagnosis present

## 2023-02-10 DIAGNOSIS — F03A4 Unspecified dementia, mild, with anxiety: Secondary | ICD-10-CM | POA: Diagnosis present

## 2023-02-10 DIAGNOSIS — J9611 Chronic respiratory failure with hypoxia: Secondary | ICD-10-CM | POA: Diagnosis present

## 2023-02-10 DIAGNOSIS — G609 Hereditary and idiopathic neuropathy, unspecified: Secondary | ICD-10-CM | POA: Diagnosis present

## 2023-02-10 DIAGNOSIS — Z79899 Other long term (current) drug therapy: Secondary | ICD-10-CM | POA: Diagnosis not present

## 2023-02-10 DIAGNOSIS — Z880 Allergy status to penicillin: Secondary | ICD-10-CM | POA: Diagnosis not present

## 2023-02-10 DIAGNOSIS — Z8249 Family history of ischemic heart disease and other diseases of the circulatory system: Secondary | ICD-10-CM | POA: Diagnosis not present

## 2023-02-10 DIAGNOSIS — I129 Hypertensive chronic kidney disease with stage 1 through stage 4 chronic kidney disease, or unspecified chronic kidney disease: Secondary | ICD-10-CM | POA: Diagnosis present

## 2023-02-10 DIAGNOSIS — Z96643 Presence of artificial hip joint, bilateral: Secondary | ICD-10-CM | POA: Diagnosis not present

## 2023-02-10 DIAGNOSIS — I872 Venous insufficiency (chronic) (peripheral): Secondary | ICD-10-CM | POA: Diagnosis present

## 2023-02-10 DIAGNOSIS — I10 Essential (primary) hypertension: Secondary | ICD-10-CM

## 2023-02-10 DIAGNOSIS — Z96641 Presence of right artificial hip joint: Secondary | ICD-10-CM | POA: Diagnosis not present

## 2023-02-10 DIAGNOSIS — Z9981 Dependence on supplemental oxygen: Secondary | ICD-10-CM | POA: Diagnosis not present

## 2023-02-10 DIAGNOSIS — J449 Chronic obstructive pulmonary disease, unspecified: Secondary | ICD-10-CM

## 2023-02-10 DIAGNOSIS — S73004A Unspecified dislocation of right hip, initial encounter: Secondary | ICD-10-CM | POA: Diagnosis present

## 2023-02-10 DIAGNOSIS — G2581 Restless legs syndrome: Secondary | ICD-10-CM | POA: Diagnosis present

## 2023-02-10 DIAGNOSIS — Z471 Aftercare following joint replacement surgery: Secondary | ICD-10-CM | POA: Diagnosis not present

## 2023-02-10 DIAGNOSIS — K219 Gastro-esophageal reflux disease without esophagitis: Secondary | ICD-10-CM | POA: Diagnosis present

## 2023-02-10 DIAGNOSIS — F32A Depression, unspecified: Secondary | ICD-10-CM | POA: Diagnosis present

## 2023-02-10 DIAGNOSIS — J4489 Other specified chronic obstructive pulmonary disease: Secondary | ICD-10-CM | POA: Diagnosis present

## 2023-02-10 DIAGNOSIS — G4700 Insomnia, unspecified: Secondary | ICD-10-CM | POA: Diagnosis present

## 2023-02-10 DIAGNOSIS — Y831 Surgical operation with implant of artificial internal device as the cause of abnormal reaction of the patient, or of later complication, without mention of misadventure at the time of the procedure: Secondary | ICD-10-CM | POA: Diagnosis present

## 2023-02-10 DIAGNOSIS — Z87891 Personal history of nicotine dependence: Secondary | ICD-10-CM | POA: Diagnosis not present

## 2023-02-10 DIAGNOSIS — M25351 Other instability, right hip: Secondary | ICD-10-CM | POA: Diagnosis not present

## 2023-02-10 DIAGNOSIS — Z7983 Long term (current) use of bisphosphonates: Secondary | ICD-10-CM | POA: Diagnosis not present

## 2023-02-10 DIAGNOSIS — Z96642 Presence of left artificial hip joint: Secondary | ICD-10-CM | POA: Diagnosis present

## 2023-02-10 DIAGNOSIS — E039 Hypothyroidism, unspecified: Secondary | ICD-10-CM | POA: Diagnosis present

## 2023-02-10 DIAGNOSIS — Z7951 Long term (current) use of inhaled steroids: Secondary | ICD-10-CM | POA: Diagnosis not present

## 2023-02-10 DIAGNOSIS — H8110 Benign paroxysmal vertigo, unspecified ear: Secondary | ICD-10-CM | POA: Diagnosis present

## 2023-02-10 DIAGNOSIS — Z79890 Hormone replacement therapy (postmenopausal): Secondary | ICD-10-CM | POA: Diagnosis not present

## 2023-02-10 DIAGNOSIS — N182 Chronic kidney disease, stage 2 (mild): Secondary | ICD-10-CM | POA: Diagnosis present

## 2023-02-10 DIAGNOSIS — J309 Allergic rhinitis, unspecified: Secondary | ICD-10-CM | POA: Diagnosis present

## 2023-02-10 HISTORY — PX: TOTAL HIP REVISION: SHX763

## 2023-02-10 LAB — BASIC METABOLIC PANEL WITH GFR
Anion gap: 6 (ref 5–15)
BUN: 17 mg/dL (ref 8–23)
CO2: 28 mmol/L (ref 22–32)
Calcium: 8.7 mg/dL — ABNORMAL LOW (ref 8.9–10.3)
Chloride: 108 mmol/L (ref 98–111)
Creatinine, Ser: 0.81 mg/dL (ref 0.44–1.00)
GFR, Estimated: >60 mL/min
Glucose, Bld: 134 mg/dL — ABNORMAL HIGH (ref 70–99)
Potassium: 3.8 mmol/L (ref 3.5–5.1)
Sodium: 142 mmol/L (ref 135–145)

## 2023-02-10 LAB — CBC
HCT: 34.5 % — ABNORMAL LOW (ref 36.0–46.0)
Hemoglobin: 11.3 g/dL — ABNORMAL LOW (ref 12.0–15.0)
MCH: 29.7 pg (ref 26.0–34.0)
MCHC: 32.8 g/dL (ref 30.0–36.0)
MCV: 90.8 fL (ref 80.0–100.0)
Platelets: 193 10*3/uL (ref 150–400)
RBC: 3.8 MIL/uL — ABNORMAL LOW (ref 3.87–5.11)
RDW: 13.2 % (ref 11.5–15.5)
WBC: 11.1 10*3/uL — ABNORMAL HIGH (ref 4.0–10.5)
nRBC: 0 % (ref 0.0–0.2)

## 2023-02-10 LAB — TYPE AND SCREEN
ABO/RH(D): O POS
Antibody Screen: NEGATIVE

## 2023-02-10 SURGERY — TOTAL HIP REVISION
Anesthesia: General | Site: Hip | Laterality: Right

## 2023-02-10 MED ORDER — SENNA 8.6 MG PO TABS
2.0000 | ORAL_TABLET | Freq: Every day | ORAL | Status: DC
  Administered 2023-02-10 – 2023-02-16 (×7): 17.2 mg via ORAL
  Filled 2023-02-10 (×7): qty 2

## 2023-02-10 MED ORDER — OXYCODONE HCL 5 MG PO TABS: 5.0000 mg | ORAL_TABLET | Freq: Once | ORAL | Status: DC | PRN

## 2023-02-10 MED ORDER — CEFAZOLIN SODIUM-DEXTROSE 2-3 GM-%(50ML) IV SOLR: INTRAVENOUS | Status: DC | PRN

## 2023-02-10 MED ORDER — DIPHENHYDRAMINE HCL 12.5 MG/5ML PO ELIX: 12.5000 mg | ORAL_SOLUTION | ORAL | Status: DC | PRN

## 2023-02-10 MED ORDER — LACTATED RINGERS IV SOLN: INTRAVENOUS | Status: DC

## 2023-02-10 MED ORDER — CHLORHEXIDINE GLUCONATE 4 % EX SOLN: 60.0000 mL | Freq: Once | CUTANEOUS | Status: DC

## 2023-02-10 MED ORDER — ALPRAZOLAM 0.5 MG PO TABS
0.5000 mg | ORAL_TABLET | Freq: Two times a day (BID) | ORAL | Status: DC | PRN
  Administered 2023-02-10 – 2023-02-11 (×2): 0.5 mg via ORAL
  Filled 2023-02-10 (×2): qty 1

## 2023-02-10 MED ORDER — CEFAZOLIN SODIUM-DEXTROSE 2-4 GM/100ML-% IV SOLN: 2.0000 g | INTRAVENOUS | Status: DC

## 2023-02-10 MED ORDER — PHENYLEPHRINE 80 MCG/ML (10ML) SYRINGE FOR IV PUSH (FOR BLOOD PRESSURE SUPPORT)
PREFILLED_SYRINGE | INTRAVENOUS | Status: AC
  Filled 2023-02-10: qty 10

## 2023-02-10 MED ORDER — SODIUM CHLORIDE (PF) 0.9 % IJ SOLN
INTRAMUSCULAR | Status: AC
  Filled 2023-02-10: qty 50

## 2023-02-10 MED ORDER — POVIDONE-IODINE 10 % EX SWAB: 2.0000 | Freq: Once | CUTANEOUS | Status: DC

## 2023-02-10 MED ORDER — PROPOFOL 10 MG/ML IV BOLUS
INTRAVENOUS | Status: DC | PRN
  Administered 2023-02-10: 40 mg via INTRAVENOUS

## 2023-02-10 MED ORDER — ONDANSETRON HCL 4 MG/2ML IJ SOLN
4.0000 mg | Freq: Four times a day (QID) | INTRAMUSCULAR | Status: DC | PRN
  Administered 2023-02-10: 4 mg via INTRAVENOUS
  Filled 2023-02-10: qty 2

## 2023-02-10 MED ORDER — LEVOFLOXACIN IN D5W 750 MG/150ML IV SOLN: 750.0000 mg | Freq: Once | INTRAVENOUS | Status: DC

## 2023-02-10 MED ORDER — AMLODIPINE BESYLATE 5 MG PO TABS
5.0000 mg | ORAL_TABLET | Freq: Every day | ORAL | Status: DC
  Administered 2023-02-11 – 2023-02-15 (×5): 5 mg via ORAL
  Filled 2023-02-10 (×5): qty 1

## 2023-02-10 MED ORDER — BISACODYL 10 MG RE SUPP: 10.0000 mg | Freq: Every day | RECTAL | Status: DC | PRN

## 2023-02-10 MED ORDER — TRANEXAMIC ACID-NACL 1000-0.7 MG/100ML-% IV SOLN
1000.0000 mg | Freq: Once | INTRAVENOUS | Status: AC
Start: 2023-02-10 — End: 2023-02-10
  Administered 2023-02-10: 1000 mg via INTRAVENOUS
  Filled 2023-02-10: qty 100

## 2023-02-10 MED ORDER — OXYCODONE HCL 5 MG PO TABS
2.5000 mg | ORAL_TABLET | ORAL | Status: DC | PRN
  Administered 2023-02-10: 5 mg via ORAL
  Filled 2023-02-10: qty 1

## 2023-02-10 MED ORDER — UMECLIDINIUM BROMIDE 62.5 MCG/ACT IN AEPB
1.0000 | INHALATION_SPRAY | Freq: Every day | RESPIRATORY_TRACT | Status: DC
  Administered 2023-02-11 – 2023-02-17 (×7): 1 via RESPIRATORY_TRACT
  Filled 2023-02-10: qty 7

## 2023-02-10 MED ORDER — ROCURONIUM BROMIDE 10 MG/ML (PF) SYRINGE
PREFILLED_SYRINGE | INTRAVENOUS | Status: AC
  Filled 2023-02-10: qty 10

## 2023-02-10 MED ORDER — LIDOCAINE HCL (PF) 2 % IJ SOLN
INTRAMUSCULAR | Status: AC
  Filled 2023-02-10: qty 5

## 2023-02-10 MED ORDER — GLYCOPYRROLATE 0.2 MG/ML IJ SOLN
INTRAMUSCULAR | Status: AC
  Filled 2023-02-10: qty 1

## 2023-02-10 MED ORDER — SODIUM CHLORIDE 0.9% FLUSH
3.0000 mL | Freq: Two times a day (BID) | INTRAVENOUS | Status: DC
Start: 2023-02-10 — End: 2023-02-17
  Administered 2023-02-11: 10 mL via INTRAVENOUS
  Administered 2023-02-11 – 2023-02-12 (×2): 3 mL via INTRAVENOUS
  Administered 2023-02-12 – 2023-02-13 (×2): 10 mL via INTRAVENOUS
  Administered 2023-02-13: 3 mL via INTRAVENOUS
  Administered 2023-02-14 (×2): 10 mL via INTRAVENOUS
  Administered 2023-02-15: 3 mL via INTRAVENOUS
  Administered 2023-02-15: 10 mL via INTRAVENOUS
  Administered 2023-02-16: 3 mL via INTRAVENOUS
  Administered 2023-02-16: 4 mL via INTRAVENOUS
  Administered 2023-02-17: 10 mL via INTRAVENOUS

## 2023-02-10 MED ORDER — PANTOPRAZOLE SODIUM 40 MG PO TBEC
40.0000 mg | DELAYED_RELEASE_TABLET | Freq: Two times a day (BID) | ORAL | Status: DC
  Administered 2023-02-10 – 2023-02-17 (×14): 40 mg via ORAL
  Filled 2023-02-10 (×14): qty 1

## 2023-02-10 MED ORDER — PHENOL 1.4 % MT LIQD: 1.0000 | OROMUCOSAL | Status: DC | PRN

## 2023-02-10 MED ORDER — FENTANYL CITRATE PF 50 MCG/ML IJ SOSY: 25.0000 ug | PREFILLED_SYRINGE | INTRAMUSCULAR | Status: DC | PRN

## 2023-02-10 MED ORDER — LEVOTHYROXINE SODIUM 100 MCG PO TABS
100.0000 ug | ORAL_TABLET | Freq: Every day | ORAL | Status: DC
  Administered 2023-02-11 – 2023-02-17 (×7): 100 ug via ORAL
  Filled 2023-02-10 (×7): qty 1

## 2023-02-10 MED ORDER — METOCLOPRAMIDE HCL 5 MG PO TABS: 5.0000 mg | ORAL_TABLET | Freq: Three times a day (TID) | ORAL | Status: DC | PRN

## 2023-02-10 MED ORDER — ACETAMINOPHEN 500 MG PO TABS
ORAL_TABLET | ORAL | Status: AC
  Administered 2023-02-10: 1000 mg via ORAL
  Filled 2023-02-10: qty 2

## 2023-02-10 MED ORDER — METHOCARBAMOL 1000 MG/10ML IJ SOLN: 500.0000 mg | Freq: Four times a day (QID) | INTRAMUSCULAR | Status: DC | PRN

## 2023-02-10 MED ORDER — OXYCODONE HCL 5 MG PO TABS
5.0000 mg | ORAL_TABLET | ORAL | Status: DC | PRN
Start: 2023-02-10 — End: 2023-02-15

## 2023-02-10 MED ORDER — MENTHOL 3 MG MT LOZG: 1.0000 | LOZENGE | OROMUCOSAL | Status: DC | PRN

## 2023-02-10 MED ORDER — HYDROMORPHONE HCL 1 MG/ML IJ SOLN
0.5000 mg | INTRAMUSCULAR | Status: DC | PRN
Start: 2023-02-10 — End: 2023-02-17

## 2023-02-10 MED ORDER — VENLAFAXINE HCL 50 MG PO TABS
50.0000 mg | ORAL_TABLET | Freq: Two times a day (BID) | ORAL | Status: DC
  Administered 2023-02-10 – 2023-02-17 (×14): 50 mg via ORAL
  Filled 2023-02-10 (×16): qty 1

## 2023-02-10 MED ORDER — DEXAMETHASONE SODIUM PHOSPHATE 10 MG/ML IJ SOLN
10.0000 mg | Freq: Once | INTRAMUSCULAR | Status: AC
  Administered 2023-02-11: 10 mg via INTRAVENOUS
  Filled 2023-02-10: qty 1

## 2023-02-10 MED ORDER — METHOCARBAMOL 500 MG PO TABS
500.0000 mg | ORAL_TABLET | Freq: Four times a day (QID) | ORAL | Status: DC | PRN
Start: 2023-02-10 — End: 2023-02-15

## 2023-02-10 MED ORDER — VANCOMYCIN HCL IN DEXTROSE 1-5 GM/200ML-% IV SOLN
1000.0000 mg | Freq: Two times a day (BID) | INTRAVENOUS | Status: AC
  Administered 2023-02-10: 1000 mg via INTRAVENOUS
  Filled 2023-02-10: qty 200

## 2023-02-10 MED ORDER — ONDANSETRON HCL 4 MG PO TABS: 4.0000 mg | ORAL_TABLET | Freq: Four times a day (QID) | ORAL | Status: DC | PRN

## 2023-02-10 MED ORDER — POLYETHYLENE GLYCOL 3350 17 G PO PACK
17.0000 g | PACK | Freq: Two times a day (BID) | ORAL | Status: DC
  Administered 2023-02-10 – 2023-02-17 (×11): 17 g via ORAL
  Filled 2023-02-10 (×14): qty 1

## 2023-02-10 MED ORDER — ONDANSETRON HCL 4 MG/2ML IJ SOLN
INTRAMUSCULAR | Status: DC | PRN
  Administered 2023-02-10: 4 mg via INTRAVENOUS

## 2023-02-10 MED ORDER — VANCOMYCIN HCL IN DEXTROSE 1-5 GM/200ML-% IV SOLN
1000.0000 mg | INTRAVENOUS | Status: DC
  Filled 2023-02-10: qty 200

## 2023-02-10 MED ORDER — BUPIVACAINE-EPINEPHRINE 0.25% -1:200000 IJ SOLN
INTRAMUSCULAR | Status: AC
  Filled 2023-02-10: qty 1

## 2023-02-10 MED ORDER — PROPOFOL 1000 MG/100ML IV EMUL
INTRAVENOUS | Status: AC
  Filled 2023-02-10: qty 100

## 2023-02-10 MED ORDER — OXYCODONE HCL 5 MG/5ML PO SOLN: 5.0000 mg | Freq: Once | ORAL | Status: DC | PRN

## 2023-02-10 MED ORDER — CEFAZOLIN SODIUM-DEXTROSE 2-3 GM-%(50ML) IV SOLR
INTRAVENOUS | Status: DC | PRN
  Administered 2023-02-10: 2 g via INTRAVENOUS

## 2023-02-10 MED ORDER — PROPOFOL 500 MG/50ML IV EMUL
INTRAVENOUS | Status: DC | PRN
  Administered 2023-02-10: 60 ug/kg/min via INTRAVENOUS

## 2023-02-10 MED ORDER — PROPOFOL 10 MG/ML IV BOLUS
INTRAVENOUS | Status: AC
Start: 2023-02-10 — End: ?
  Filled 2023-02-10: qty 20

## 2023-02-10 MED ORDER — METOCLOPRAMIDE HCL 5 MG/ML IJ SOLN: 5.0000 mg | Freq: Three times a day (TID) | INTRAMUSCULAR | Status: DC | PRN

## 2023-02-10 MED ORDER — ONDANSETRON HCL 4 MG/2ML IJ SOLN
INTRAMUSCULAR | Status: AC
  Filled 2023-02-10: qty 2

## 2023-02-10 MED ORDER — DEXAMETHASONE SODIUM PHOSPHATE 10 MG/ML IJ SOLN
INTRAMUSCULAR | Status: DC | PRN
  Administered 2023-02-10: 10 mg via INTRAVENOUS

## 2023-02-10 MED ORDER — ONDANSETRON HCL 4 MG/2ML IJ SOLN: 4.0000 mg | Freq: Once | INTRAMUSCULAR | Status: DC | PRN

## 2023-02-10 MED ORDER — FENTANYL CITRATE (PF) 100 MCG/2ML IJ SOLN
INTRAMUSCULAR | Status: DC | PRN
  Administered 2023-02-10: 50 ug via INTRAVENOUS

## 2023-02-10 MED ORDER — KETOROLAC TROMETHAMINE 30 MG/ML IJ SOLN
INTRAMUSCULAR | Status: AC
  Filled 2023-02-10: qty 1

## 2023-02-10 MED ORDER — EPHEDRINE SULFATE-NACL 50-0.9 MG/10ML-% IV SOSY
PREFILLED_SYRINGE | INTRAVENOUS | Status: DC | PRN
  Administered 2023-02-10: 10 mg via INTRAVENOUS

## 2023-02-10 MED ORDER — ASPIRIN 81 MG PO CHEW
81.0000 mg | CHEWABLE_TABLET | Freq: Two times a day (BID) | ORAL | Status: DC
  Administered 2023-02-10 – 2023-02-17 (×14): 81 mg via ORAL
  Filled 2023-02-10 (×14): qty 1

## 2023-02-10 MED ORDER — PHENYLEPHRINE HCL (PRESSORS) 10 MG/ML IV SOLN
INTRAVENOUS | Status: AC
  Filled 2023-02-10: qty 1

## 2023-02-10 MED ORDER — FENTANYL CITRATE PF 50 MCG/ML IJ SOSY
50.0000 ug | PREFILLED_SYRINGE | INTRAMUSCULAR | Status: DC | PRN
Start: 2023-02-10 — End: 2023-02-10
  Administered 2023-02-10: 50 ug via INTRAVENOUS
  Filled 2023-02-10: qty 1

## 2023-02-10 MED ORDER — DEXAMETHASONE SODIUM PHOSPHATE 10 MG/ML IJ SOLN
INTRAMUSCULAR | Status: AC
  Filled 2023-02-10: qty 1

## 2023-02-10 MED ORDER — FENTANYL CITRATE (PF) 100 MCG/2ML IJ SOLN
INTRAMUSCULAR | Status: AC
  Filled 2023-02-10: qty 2

## 2023-02-10 MED ORDER — FLUTICASONE FUROATE-VILANTEROL 100-25 MCG/ACT IN AEPB
1.0000 | INHALATION_SPRAY | Freq: Every day | RESPIRATORY_TRACT | Status: DC
  Administered 2023-02-11 – 2023-02-17 (×7): 1 via RESPIRATORY_TRACT
  Filled 2023-02-10: qty 28

## 2023-02-10 MED ORDER — TRANEXAMIC ACID-NACL 1000-0.7 MG/100ML-% IV SOLN
1000.0000 mg | INTRAVENOUS | Status: AC
  Administered 2023-02-10: 1000 mg via INTRAVENOUS
  Filled 2023-02-10: qty 100

## 2023-02-10 MED ORDER — ACETAMINOPHEN 500 MG PO TABS: 1000.0000 mg | ORAL_TABLET | Freq: Once | ORAL | Status: AC

## 2023-02-10 MED ORDER — ALUM & MAG HYDROXIDE-SIMETH 200-200-20 MG/5ML PO SUSP: 30.0000 mL | ORAL | Status: DC | PRN

## 2023-02-10 MED ORDER — BUPIVACAINE IN DEXTROSE 0.75-8.25 % IT SOLN
INTRATHECAL | Status: DC | PRN
  Administered 2023-02-10: 1.6 mL via INTRATHECAL

## 2023-02-10 MED ORDER — PHENYLEPHRINE HCL-NACL 20-0.9 MG/250ML-% IV SOLN
INTRAVENOUS | Status: DC | PRN
  Administered 2023-02-10: 40 ug/min via INTRAVENOUS

## 2023-02-10 MED ORDER — ACETAMINOPHEN 500 MG PO TABS: 1000.0000 mg | ORAL_TABLET | Freq: Once | ORAL | Status: DC

## 2023-02-10 MED ORDER — ACETAMINOPHEN 325 MG PO TABS
650.0000 mg | ORAL_TABLET | Freq: Four times a day (QID) | ORAL | Status: AC
  Administered 2023-02-11 – 2023-02-14 (×15): 650 mg via ORAL
  Filled 2023-02-10 (×15): qty 2

## 2023-02-10 MED ORDER — MELATONIN 5 MG PO TABS: 5.0000 mg | ORAL_TABLET | Freq: Every evening | ORAL | Status: DC | PRN

## 2023-02-10 MED ORDER — MELATONIN 5 MG PO TABS
5.0000 mg | ORAL_TABLET | ORAL | Status: DC
  Administered 2023-02-10 – 2023-02-16 (×7): 5 mg via ORAL
  Filled 2023-02-10 (×7): qty 1

## 2023-02-10 MED ORDER — PHENYLEPHRINE 80 MCG/ML (10ML) SYRINGE FOR IV PUSH (FOR BLOOD PRESSURE SUPPORT)
PREFILLED_SYRINGE | INTRAVENOUS | Status: DC | PRN
  Administered 2023-02-10: 160 ug via INTRAVENOUS

## 2023-02-10 MED ORDER — ALPRAZOLAM 0.5 MG PO TABS
0.5000 mg | ORAL_TABLET | Freq: Two times a day (BID) | ORAL | Status: DC | PRN
Start: 2023-02-10 — End: 2023-02-10

## 2023-02-10 MED ORDER — SODIUM CHLORIDE 0.9% FLUSH: 3.0000 mL | INTRAVENOUS | Status: DC | PRN

## 2023-02-10 SURGICAL SUPPLY — 46 items
BAG COUNTER SPONGE SURGICOUNT (BAG) IMPLANT
BAG DECANTER FOR FLEXI CONT (MISCELLANEOUS) ×1 IMPLANT
BAG ZIPLOCK 12X15 (MISCELLANEOUS) ×1 IMPLANT
BLADE SAW SGTL 18X1.27X75 (BLADE) IMPLANT
BLADE SAW SGTL 81X20 HD (BLADE) ×1 IMPLANT
BRUSH FEMORAL CANAL (MISCELLANEOUS) IMPLANT
COVER SURGICAL LIGHT HANDLE (MISCELLANEOUS) ×1 IMPLANT
DERMABOND ADVANCED .7 DNX12 (GAUZE/BANDAGES/DRESSINGS) ×1 IMPLANT
DRAPE INCISE IOBAN 85X60 (DRAPES) ×1 IMPLANT
DRAPE POUCH INSTRU U-SHP 10X18 (DRAPES) ×1 IMPLANT
DRAPE SURG 17X11 SM STRL (DRAPES) ×1 IMPLANT
DRAPE SURG ORHT 6 SPLT 77X108 (DRAPES) ×2 IMPLANT
DRAPE U-SHAPE 47X51 STRL (DRAPES) ×1 IMPLANT
DRSG AQUACEL AG ADV 3.5X10 (GAUZE/BANDAGES/DRESSINGS) IMPLANT
DRSG AQUACEL AG ADV 3.5X14 (GAUZE/BANDAGES/DRESSINGS) IMPLANT
DURAPREP 26ML APPLICATOR (WOUND CARE) ×1 IMPLANT
ELECT BLADE TIP CTD 4 INCH (ELECTRODE) ×1 IMPLANT
ELECT REM PT RETURN 15FT ADLT (MISCELLANEOUS) ×1 IMPLANT
FACESHIELD WRAPAROUND (MASK) ×4 IMPLANT
FACESHIELD WRAPAROUND OR TEAM (MASK) ×4 IMPLANT
GLOVE BIO SURGEON STRL SZ 6 (GLOVE) ×1 IMPLANT
GLOVE BIOGEL PI IND STRL 6.5 (GLOVE) ×1 IMPLANT
GLOVE BIOGEL PI IND STRL 7.5 (GLOVE) ×1 IMPLANT
GLOVE ORTHO TXT STRL SZ7.5 (GLOVE) ×2 IMPLANT
GOWN STRL REUS W/ TWL LRG LVL3 (GOWN DISPOSABLE) ×3 IMPLANT
HEAD FEM STD 28X+1.5 STRL (Hips) IMPLANT
KIT BASIN OR (CUSTOM PROCEDURE TRAY) ×1 IMPLANT
KIT TURNOVER KIT A (KITS) IMPLANT
LINER ACETABULAR 28MM +4 ESCON (Hips) IMPLANT
MANIFOLD NEPTUNE II (INSTRUMENTS) ×1 IMPLANT
NDL SAFETY ECLIPSE 18X1.5 (NEEDLE) ×1 IMPLANT
NS IRRIG 1000ML POUR BTL (IV SOLUTION) ×1 IMPLANT
PACK TOTAL JOINT (CUSTOM PROCEDURE TRAY) ×1 IMPLANT
PROTECTOR NERVE ULNAR (MISCELLANEOUS) ×1 IMPLANT
SET HNDPC FAN SPRY TIP SCT (DISPOSABLE) ×1 IMPLANT
SLEEVE SUCTION 125 (MISCELLANEOUS) ×1 IMPLANT
SOLUTION IRRIG SURGIPHOR (IV SOLUTION) IMPLANT
STAPLER SKIN PROX WIDE 3.9 (STAPLE) IMPLANT
SUCTION TUBE FRAZIER 12FR DISP (SUCTIONS) ×1 IMPLANT
SUT STRATAFIX PDS+ 0 24IN (SUTURE) ×1 IMPLANT
SUT VIC AB 1 CT1 36 (SUTURE) ×1 IMPLANT
SUT VIC AB 2-0 CT1 TAPERPNT 27 (SUTURE) ×2 IMPLANT
TOWEL OR 17X26 10 PK STRL BLUE (TOWEL DISPOSABLE) ×2 IMPLANT
TRAY FOLEY MTR SLVR 16FR STAT (SET/KITS/TRAYS/PACK) ×1 IMPLANT
TUBE SUCTION HIGH CAP CLEAR NV (SUCTIONS) ×1 IMPLANT
WATER STERILE IRR 1000ML POUR (IV SOLUTION) ×2 IMPLANT

## 2023-02-10 NOTE — Progress Notes (Signed)
 PT Cancellation Note  Patient Details Name: Priscilla Cantrell MRN: [id number] DOB: [birth year]   Cancelled Treatment:    Reason Eval/Treat Not Completed: Medical issues which prohibited therapy. Pt NPO and currently on bedrest per orthopedics progress note. Will sign off and await updated order and recommendations from orthopedics.    Dannial SQUIBB, PT Acute Rehabilitation  Office: [phone]

## 2023-02-10 NOTE — Transfer of Care (Signed)
 Immediate Anesthesia Transfer of Care Note  Patient: Priscilla Cantrell  Procedure(s) Performed: RIGHT TOTAL HIP REVISION (Right: Hip)  Patient Location: PACU  Anesthesia Type:Spinal  Level of Consciousness: drowsy and patient cooperative  Airway & Oxygen  Therapy: Patient Spontanous Breathing and Patient connected to face mask oxygen   Post-op Assessment: Report given to RN and Post -op Vital signs reviewed and stable  Post vital signs: Reviewed and stable  Last Vitals:  Vitals Value Taken Time  BP 117/59 02/10/23 1545  Temp 36.3 C 02/10/23 1543  Pulse 57 02/10/23 1547  Resp 13 02/10/23 1550  SpO2 100 % 02/10/23 1547  Vitals shown include unfiled device data.  Last Pain:  Vitals:   02/10/23 1543  TempSrc:   PainSc: Asleep         Complications: No notable events documented.

## 2023-02-10 NOTE — H&P (Addendum)
 History and Physical    Patient: Priscilla Cantrell FMW:[id number] DOB: 07-Jan-1936 DOA: 02/09/2023 DOS: the patient was seen and examined on 02/10/2023 PCP: Priscilla Aleene DEL, MD  Patient coming from: Home  Chief Complaint:  Chief Complaint  Patient presents with   Hip Pain   HPI: Priscilla Cantrell is a 88 y.o. female with medical history significant of chronic respiratory failure secondary to COPD on 2 L oxygen , hypertension, hypothyroidism, dementia, anxiety, and recurrent hip dislocation.  [name] reports she dropped something on the ground, reached down to get it and when she twisted she felt a pop, because she has experienced multiple hip dislocations in the past she felt the pain was similar to past and she presented to the [facility] ED with concern her hip was again dislocated.   ED Course: On arrival to [facility] ED, patient was noted to be afebrile temp 36.8C, BP 170/79, HR 82, RR 20, SpO2 96% on 3L increase from baseline 2 L. Plain film of (R) hip obtained and shows Superior dislocation of the right hip replacement. Labs notable for slight leukocytosis WBC 11.1 WBC, likely reactive, hemoglobin 11.3, and glucose elevated at 134. Patient was given a 500 cc normal saline bolus and moderate sedation and attempt to reduce hip dislocation. [facility] EDP attempted reduction under moderation sedation however patient became hypoxic and reduction aborted. Ortho, Dr Kay was consulted and plans to take patient to OR at [facility] for repair. [facility] contacted for admission.  Review of Systems: As mentioned in the history of present illness. All other systems reviewed and are negative. Past Medical History:  Diagnosis Date   Allergic rhinitis    with upper airway cough: as of 07/2017 pulm f/u she was instructed to use astelin , flonase , and saline more consistently   BPPV (benign paroxysmal positional vertigo) 03/2018   C. difficile colitis    Chest pain, non-cardiac    Cardiac CT showed no signif  obstructive dz, mildly elevated calcium  level (Dr. Pietro).   Chronic cystitis with hematuria    Dr. Roman   Chronic hypoxemic respiratory failure (HCC) 02/02/2015   2L oxygen  24/7   Chronic renal insufficiency, stage 2 (mild)    GFR approx 60   Cold hands    NCS/EMGs normal.  Hand dysesthesias per neuro--reassured   COPD (chronic obstructive pulmonary disease) (HCC)    GOLD 4.  spirometry 01/10/09 FEV 0.97(52%), FEV1% 47.  With chronic bronchitis as of 07/2017.   DDD (degenerative disc disease), cervical    1998 MRI C5-6 impingemt (left).  MRI 2021 (neurol) 1 level canal stenosis, 1 level foraminal stenosis--->PT   DDD (degenerative disc disease), lumbar    Depression with anxiety 04/15/2011   Diarrheal disease Summer 2017   ? 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 Dr Luis).  As of 10/05/15 pt reports GI dx'd her with C diff and rx'd more flagyl  and she is also on cholestyramine .  As of 02/2016, pt's sx's resolved completely.   Diverticulosis of colon    Procto '97 and colonoscopy 2002   Dysplastic nevus of upper extremity 04/2014   R tricep (Dr. Livingston)   Edema of both lower extremities due to peripheral venous insufficiency    Varicosities bilat, swelling L>R.  R baker's cyst.  Got vein clinic eval 02/2018->sclerotherapy recommended but since no hemorrhage or ulceration, insurance will not cover the procedure.   Family history of adverse reaction to anesthesia  mother died when patient age 31 receiving anesthesia for thyroid  surgery   Fibrocystic breast disease    w/fibroadenoma.  Bx's showed NO atypia (Dr. Merrilyn).  Mammo neg 2008.   GERD (gastroesophageal reflux disease)    History of double vision    Ophthalmologist, Dr. Cleatus, is further evaluating this with MRI  orbits and limited brain (myesthenia gravis testing neg 07/2010)   History of home oxygen  therapy    uses 2  liters at hs   Hypertension    EKG 03/2010 normal   Hypothyroidism    Hashimoto's, dx'd 1989   Idiopathic peripheral neuropathy 04/2018   Sensory (numb feet) S1 distribution bilat, c/w spinal stenosis sensory neuropathy (no pain). 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  Labs [place], MRI C spine->some spondylosis/stenosis.  UE NCS/EMS: normal 04/2019.   Lesion of right native kidney 2017   found on CT 02/2015.  F/u MRI 03/31/15 showed it to be smaller and less likely of any significance but repeat CT renal protocol in 60mo was recommended by radiology.  This was done 10/26/15 and showed no interval change in the lesion, but b/c the lesion enhances with contrast, radiol rec'd urol consult (b/c pap renal RCC could not be excluded).  Saw urol 03/06/16--stable on u/s 09/2016 and 09/2017.   OAB (overactive bladder)    Osteoarthritis, hip, bilateral    she is s/p bilat THA as of 02/2018.   Osteoporosis    radius, T score -2.5->rec'd alendronate  08/2019->plan rpt DEXA 08/2021.   Pneumonia    Postmenopausal atrophic vaginitis    improved with estrace 2 X/week.   Prolapse of vaginal cuff after hysterectomy    10/2018: Dr. Louvenia. Mild colpocele 04/2019 per GYN (no surgery)   Pulmonary nodule    CT chest 12/10, June 2011.  No nodule on CT 06/2010.   Recurrent Clostridium difficile diarrhea    Episode 09/19/2016: resolved with prolonged course of flagyl .  11/2016 recurrence treated with 10d of Dificid (Fidaxomicin) by GI (Dr. Luis).   Recurrent UTI    likely related to pt's atrophic vaginitis.  Urol started vaginal estrace 03/2016.   Rib fracture 09/2018   R 7th, laterally-->sustained when her dog pulled her over and she fell.   RLS (restless legs syndrome)    neuro->gabapentin  trial 2020/21   Shingles 02/2014   R abd/flank   Spinal stenosis of lumbar region with neurogenic claudication    2008 MRI (L3-4, L4-5, L5-S1).  +S1 distribution sensory  loss bilat   Past Surgical History:  Procedure Laterality Date   ABDOMINAL HYSTERECTOMY  1978   no BSO per pt--nonmalignant reasons   APPENDECTOMY     BREAST CYST EXCISION     Last screening mammogram 10/2010 was normal.   CHOLECYSTECTOMY  2001   COLONOSCOPY  04/2005; 12/06/2015   2007 NORMAL.  2017 adenomatous polyp x 1.  Mod sigmoid diverticulosis (Dr. Luis).     DEXA  05/18/2017; 08/7019   2019 NORMAL (T-score 0.1).  08/2019->T score -2.5->alendronate  started.  03/2022 T score -2.7   HIP CLOSED REDUCTION Right 12/25/2015   Procedure: CLOSED REDUCTION HIP;  Surgeon: Selinda Belvie Gosling, MD;  Location: [facility];  Service: Orthopedics;  Laterality: Right;   NCV/EMS  10/2018   chronic and symmetric sensorimotor axonal polyneuropathy affecting the lower extremities   TONSILLECTOMY     TOTAL HIP ARTHROPLASTY Right 11/01/2014   Procedure: RIGHT TOTAL HIP ARTHROPLASTY;  Surgeon: Tanda Heading, MD;  Location: [facility];  Service:  Orthopedics;  Laterality: Right;   TOTAL HIP ARTHROPLASTY Left 12/08/2017   Procedure: LEFT TOTAL HIP ARTHROPLASTY ANTERIOR APPROACH;  Surgeon: Ernie Cough, MD;  Location: [facility];  Service: Orthopedics;  Laterality: Left;  70 mins   TRANSTHORACIC ECHOCARDIOGRAM  08/2014   2016 EF 60-65%, grade I diast dysfxn. 06/21/21 unchanged.   TUBAL LIGATION  1974   Social History:  reports that she quit smoking about 33 years ago. Her smoking use included cigarettes. She started smoking about 67 years ago. She has a 51 pack-year smoking history. She has never used smokeless tobacco. She reports current alcohol  use. She reports that she does not use drugs.  Allergies  Allergen Reactions   Losartan  Other (See Comments)    hyperkalemia   Penicillins Hives, Swelling and Rash    Has patient had a PCN reaction causing immediate rash, facial/tongue/throat swelling, SOB or lightheadedness with hypotension: YES Has patient had a PCN reaction causing severe rash involving mucus membranes  or skin necrosis: NO Has patient had a PCN reaction that required hospitalization NO Has patient had a PCN reaction occurring within the last 10 years: NO If all of the above answers are NO, then may proceed with Cephalosporin use.    Cefixime [Kdc:Cefixime] Other (See Comments)    unspecified   Gabapentin  Other (See Comments)    Feels woozy   Indocin [Indomethacin] Other (See Comments)    Painful tongue and throat   Singulair  [Montelukast  Sodium]     Chest tightness, decreased mental clarity   Sulfa Antibiotics Other (See Comments)    unspecified   Ace Inhibitors Other (See Comments)    cough   Statins Other (See Comments)    Myalgias     Family History  Problem Relation Age of Onset   Goiter Mother    Psoriasis Father        od liver   Cirrhosis Father    Diabetes Daughter        Type 2   Stroke Maternal Grandfather    Stroke Paternal Grandmother    Hypertension Paternal Grandmother    Hernia Paternal Grandmother    Cancer Paternal Grandfather        lung   Cancer Maternal Aunt        cancer of the stomach    Prior to Admission medications   Medication Sig Start Date End Date Taking? Authorizing Provider  albuterol  (PROAIR  HFA) 108 (90 Base) MCG/ACT inhaler Inhale 2 puffs into the lungs every 4 (four) hours as needed for wheezing or shortness of breath. 12/30/22   Mannam, Praveen, MD  albuterol  (PROVENTIL ) (2.5 MG/3ML) 0.083% nebulizer solution Take 3 mLs (2.5 mg total) by nebulization every 6 (six) hours as needed for wheezing or shortness of breath. Patient not taking: Reported on 11/12/2022 09/09/21   Shellia Oh, MD  alendronate  (FOSAMAX ) 70 MG tablet TAKE 1 TABLET(70 MG) BY MOUTH 1 TIME A WEEK WITH A FULL GLASS OF WATER  AND ON AN EMPTY STOMACH Patient not taking: Reported on 12/10/2022 09/10/20   McGowen, Philip H, MD  amLODipine  (NORVASC ) 5 MG tablet TAKE 1 TABLET(5 MG) BY MOUTH DAILY 01/06/23   McGowen, Aleene DEL, MD  Artificial Tear Solution (SOOTHE XP) SOLN Place  1 drop into both eyes 2 (two) times daily as needed (irritation). Patient not taking: Reported on 12/10/2022    [provider]  azelastine  (ASTELIN ) 0.1 % nasal spray Place 2 sprays into both nostrils 2 (two) times daily. Use in each nostril as directed  03/03/22   McGowen, Aleene DEL, MD  cetirizine  (ZYRTEC ) 10 MG tablet Take 1 tablet (10 mg total) by mouth daily. Patient not taking: Reported on 12/10/2022 05/22/20   McGowen, Philip H, MD  clobetasol  (TEMOVATE ) 0.05 % external solution APPLY TO THE AFFECTED AREA EVERY DAY Patient not taking: Reported on 12/10/2022 03/08/20   Sheffield, Kelli R, PA-C  clobetasol  cream (TEMOVATE ) 0.05 % Apply 1 Application topically 2 (two) times daily. Dr. hall Patient not taking: Reported on 12/10/2022    [provider]  cycloSPORINE  (RESTASIS  OP) Apply to eye as needed.    [provider]  fluticasone  (FLONASE ) 50 MCG/ACT nasal spray Place 2 sprays into both nostrils daily. 05/22/20   McGowen, Aleene DEL, MD  Fluticasone -Umeclidin-Vilant (TRELEGY ELLIPTA ) 100-62.5-25 MCG/ACT AEPB Inhale 1 puff into the lungs daily. 05/19/22   Sood, [name], MD  furosemide  (LASIX ) 20 MG tablet 1 tab po twice a day as needed for LE swelling Patient not taking: Reported on 12/10/2022 03/03/22   McGowen, Philip H, MD  ipratropium-albuterol  (DUONEB) 0.5-2.5 (3) MG/3ML SOLN Inhale 3 mLs into the lungs every 6 (six) hours as needed. Patient not taking: Reported on 11/12/2022 11/01/22   [provider]  levothyroxine  (SYNTHROID ) 100 MCG tablet TAKE 1 TABLET BY MOUTH EVERY DAY 01/06/23   McGowen, Aleene DEL, MD  lidocaine  (XYLOCAINE ) 2 % solution 5 ml po swish and spit tid prior to eating Patient not taking: Reported on 12/10/2022 09/25/22   McGowen, Philip H, MD  Lifitegrast  (XIIDRA ) 5 % SOLN Place 1 drop into both eyes in the morning and at bedtime. Patient not taking: Reported on 12/10/2022    [provider]  Misc. Devices (PULSE OXIMETER) MISC Check oxygen   level as needed 03/12/22   McGowen, Aleene DEL, MD  pantoprazole  (PROTONIX ) 40 MG tablet Take 1 tablet (40 mg total) by mouth 2 (two) times daily. Patient not taking: Reported on 12/10/2022 11/12/22   McGowen, Philip H, MD  roflumilast  (DALIRESP ) 500 MCG TABS tablet Take 1 tablet (500 mcg total) by mouth daily. Patient not taking: Reported on 12/10/2022 05/31/20   Hope Almarie ORN, NP  vitamin B-12 (CYANOCOBALAMIN ) 100 MCG tablet Take 100 mcg by mouth daily.    [provider]  VITAMIN D  PO Take 1 tablet by mouth daily.    [provider]    Physical Exam: Vitals:   02/10/23 0345 02/10/23 0345 02/10/23 0500 02/10/23 0537  BP: (!) 151/63  (!) 159/86 (!) 172/71  Pulse: (!) 59  76 71  Resp: 15  20 (!) 24  Temp:  98.6 F (37 C)  98.5 F (36.9 C)  TempSrc:  Oral  Oral  SpO2: 95%  90% 95%  Weight:      Height:       Constitutional: NAD, calm, comfortable Eyes: PERRL, lids and conjunctivae normal ENMT: Mucous membranes are moist. Posterior pharynx clear of any exudate or lesions. Neck: normal, supple, no masses, no thyromegaly Respiratory: clear to auscultation bilaterally, no wheezing, no crackles. Normal respiratory effort. No accessory muscle use.  Cardiovascular: Regular rate and rhythm, no murmurs / rubs / gallops. No extremity edema. 2+ radial and pedal pulses. No carotid bruits.  Abdomen: no tenderness, no masses palpated. No hepatosplenomegaly. Bowel sounds positive.  Musculoskeletal: no clubbing / cyanosis. (R)LE internally rotated and shortened.  Otherwise no joint deformity upper and lower extremities. Good ROM, no contractures. Normal muscle tone.  Skin: no rashes, lesions, ulcers.  Neurologic: Alert and oriented x 3.CN 2-12 grossly  intact. Sensation intact, Strength 5/5 x 3/4 extremities, strength (R)LE not assessed.  Psychiatric: Normal judgment and insight.  Normal mood.    Data Reviewed: CBC    Component Value Date/Time   WBC 11.1 (H) 02/10/2023 0002    RBC 3.80 (L) 02/10/2023 0002   HGB 11.3 (L) 02/10/2023 0002   HCT 34.5 (L) 02/10/2023 0002   PLT 193 02/10/2023 0002   MCV 90.8 02/10/2023 0002   MCH 29.7 02/10/2023 0002   MCHC 32.8 02/10/2023 0002   RDW 13.2 02/10/2023 0002   LYMPHSABS 1.9 05/17/2022 1206   MONOABS 1.1 (H) 05/17/2022 1206   EOSABS 0.1 05/17/2022 1206   BASOSABS 0.0 05/17/2022 1206      Latest Ref Rng & Units 02/10/2023   12:02 AM 05/17/2022   12:06 PM 05/07/2022    2:19 PM  BMP  Glucose 70 - 99 mg/dL 865  899  873   BUN 8 - 23 mg/dL 17  15  18    Creatinine 0.44 - 1.00 mg/dL 9.18  9.18  9.17   Sodium 135 - 145 mmol/L 142  142  146   Potassium 3.5 - 5.1 mmol/L 3.8  3.5  3.9   Chloride 98 - 111 mmol/L 108  103  107   CO2 22 - 32 mmol/L 28  30  29    Calcium  8.9 - 10.3 mg/dL 8.7  9.4  9.7    DG HIP PORT UNILAT WITH PELVIS 1V RIGHT Result Date: 02/09/2023 CLINICAL DATA:  Postreduction EXAM: DG HIP (WITH OR WITHOUT PELVIS) 1V PORT RIGHT COMPARISON:  02/09/2023 FINDINGS: Prior right hip replacement. Continued superior dislocation. No visible fracture. IMPRESSION: Continued superior dislocation of the right hip replacement. Electronically Signed   By: Franky Crease M.D.   On: 02/09/2023 22:07   DG Hip Unilat  With Pelvis 2-3 Views Right Result Date: 02/09/2023 CLINICAL DATA:  Felt in right hip pop, pain EXAM: DG HIP (WITH OR WITHOUT PELVIS) 2-3V RIGHT COMPARISON:  05/29/2022 FINDINGS: Patient is status post bilateral hip replacements. There is superior dislocation of the right hip replacement. No fracture. SI joints symmetric and unremarkable. IMPRESSION: Bilateral hip replacements. Superior dislocation of the right hip replacement. Electronically Signed   By: Franky Crease M.D.   On: 02/09/2023 20:57      Assessment and Plan: # (R) Hip Dislocation - Ortho consulted by EDP. Ortho requesting admit to [facility]. Appreciate Orthopedics consultation and management - Pain control  - PT/OT evaluation post-op - Medically optimized for  surgery; was hypertensive in ED this morning. Home amlodipine  reordered. Patient has not had a recent functional decline and is independent in basic ADLs. She denies recent chest pain at rest or with exertion, does not have a history of myocardial infarctions, CHF, arrhythmias, or valvular disease. LVEF 06/2021 60-65%. No history of diabetes. She is on 2L O2 at baseline and SPO2 currently 95% on 3L. She denies dyspnea and is breathing comfortably on exam.      #Chronic Hypoxic Respiratory Failure #COPD On 2L O2 via nasal cannula at baseline - Continue home O2 and Trelegy Ellipta  - PRN nebulizers while inpatient  #Hypertension - Continue home amlodipine   #Hypothyroidism - Continue home Synthroid   #Dementia #Generalized Anxiety Disorder Per chart review does fine during the day but has significant sundowing at night - Trial 5 mg Melatonin at 1800 - Continue home Venlaxafine and PRN Xanax    Advance Care Planning:   Code Status: Full Code   Consults: Ortho  Family Communication: Alyse Agent  updated via phone   Severity of Illness: The appropriate patient status for this patient is INPATIENT. Inpatient status is judged to be reasonable and necessary in order to provide the required intensity of service to ensure the patient's safety. The patient's presenting symptoms, physical exam findings, and initial radiographic and laboratory data in the context of their chronic comorbidities is felt to place them at high risk for further clinical deterioration. Furthermore, it is not anticipated that the patient will be medically stable for discharge from the hospital within 2 midnights of admission.   * I certify that at the point of admission it is my clinical judgment that the patient will require inpatient hospital care spanning beyond 2 midnights from the point of admission due to high intensity of service, high risk for further deterioration and high frequency of surveillance  required.*  Attending Note: Agree with above nurse practitioner note.   Patient was seen and examined individually.  All the labs imaging reviewed independently.   This 88 yrs old female with PMH  significant for chronic hypoxic respiratory failure secondary to COPD on 2 L of supplemental oxygen  at baseline, hypertension, hypothyroidism, dementia, anxiety, and recurrent hip dislocation.  Patient presented status post recurrent hip dislocation.  She stated while picking something from the ground she has heard the pop, she has experienced multiple dislocations in the past and he felt the pain seems similar to the past and she is found to have superior dislocation of the right hip replacement.  Patient was evaluated by orthopedics and recommended to have dose reduction or revision of the hip replacement in the OR.    This document was prepared using Dragon voice recognition software and may include unintentional dictation errors.  Rockie Rams FNP-BC, PMHNP-BC Nurse Practitioner [facility]

## 2023-02-10 NOTE — TOC Initial Note (Signed)
 Transition of Care [facility]) - Initial/Assessment Note   Patient Details  Name: Priscilla Cantrell MRN: [id number] Date of Birth: 09-27-1935  Transition of Care [facility]) CM/SW Contact:    Duwaine GORMAN Aran, LCSW Phone Number: 02/10/2023, 11:26 AM  Clinical Narrative: Patient will have surgery today. PT/OT to assess patient after surgery. TOC awaiting recommendations.  Expected Discharge Plan:  (TBD) Barriers to Discharge: Continued Medical Work up  Expected Discharge Plan and Services In-house Referral: Clinical Social Work Living arrangements for the past 2 months: Single Family Home  Prior Living Arrangements/Services Living arrangements for the past 2 months: Single Family Home Lives with:: Significant Other Patient language and need for interpreter reviewed:: Yes Do you feel safe going back to the place where you live?: Yes      Need for Family Participation in Patient Care: No (Comment) Care giver support system in place?: Yes (comment) Criminal Activity/Legal Involvement Pertinent to Current Situation/Hospitalization: No - Comment as needed  Activities of Daily Living ADL Screening (condition at time of admission) Independently performs ADLs?: Yes (appropriate for developmental age) Is the patient deaf or have difficulty hearing?: No Does the patient have difficulty seeing, even when wearing glasses/contacts?: Yes Does the patient have difficulty concentrating, remembering, or making decisions?: Yes  Emotional Assessment Orientation: : Oriented to Self, Oriented to Place, Oriented to  Time, Oriented to Situation Alcohol  / Substance Use: Not Applicable Psych Involvement: No (comment)  Admission diagnosis:  Hip dislocation, right, initial encounter (HCC) [S73.004A] Dislocation of right hip, initial encounter [facility]) [S73.004A] Patient Active Problem List   Diagnosis Date Noted   Hip dislocation, right, initial encounter (HCC) 02/10/2023   Sepsis (HCC) 02/22/2022   Acute on chronic  respiratory failure with hypoxia (HCC) 02/21/2022   Influenza A 02/21/2022   Shortness of breath 12/08/2019   Swelling of lower extremity 12/08/2019   Stress due to family tension 12/08/2019   Chronic cystitis with hematuria 08/15/2019   Urinary urgency 08/15/2019   Multiple closed fractures of ribs of right side 10/05/2018   Healthcare maintenance 10/05/2018   Medication management 07/29/2018   Overweight (BMI 25.0-29.9) 12/09/2017   S/P left KA 12/08/2017   S/P left THA, AA 12/08/2017   Postherpetic neuralgia 03/07/2015   Constipation 02/28/2015   Renal mass 02/28/2015   Acute pyelonephritis 02/20/2015   Oral thrush 02/08/2015   Chronic hypoxemic respiratory failure (HCC) 02/02/2015   SOB (shortness of breath)    Essential hypertension 01/24/2015   UTI (urinary tract infection) 01/09/2015   Pain and swelling of right lower leg 01/09/2015   H/O total hip arthroplasty 11/01/2014   Spinal stenosis, lumbar region, with neurogenic claudication 01/04/2014   Osteoarthritis, hip, bilateral 07/27/2013   Sciatica associated with disorder of lumbosacral spine 04/19/2013   Atrophic vaginitis 01/12/2012   Periodic limb movement disorder 11/24/2011   Hypoxia 05/22/2011   Depression with anxiety 04/15/2011   Chronic venous insufficiency 07/29/2010   Hypothyroidism due to Hashimoto's thyroiditis 01/07/2010   SPINAL STENOSIS 01/07/2010   Allergic rhinitis 08/09/2007   COPD GOLD III B 08/09/2007   PCP:  Candise Aleene DEL, MD Pharmacy:   [facility] [phone] - [place],  - [address] [place] [address] Phone: [phone] Fax: [phone]  Social Drivers of Health (SDOH) Social History: SDOH Screenings   Food Insecurity: No Food Insecurity (02/10/2023)  Housing: Low Risk  (02/10/2023)  Transportation Needs: No Transportation Needs (02/10/2023)  Utilities:  Not At Risk (02/10/2023)  Alcohol  Screen: Low Risk   (02/01/2020)  Depression (PHQ2-9): Low Risk  (11/12/2022)  Financial Resource Strain: Low Risk  (03/19/2022)  Physical Activity: Sufficiently Active (03/19/2022)  Social Connections: Socially Integrated (02/10/2023)  Stress: No Stress Concern Present (03/19/2022)  Tobacco Use: Medium Risk (02/09/2023)   SDOH Interventions:    Readmission Risk Interventions     No data to display

## 2023-02-10 NOTE — Progress Notes (Signed)
 Plan of Care Note for accepted transfer   Patient: Priscilla Cantrell MRN: [id number]   DOA: 02/09/2023  Facility requesting transfer: [facility].  ER. Requesting Provider: Dr. Theadore. Reason for transfer: Recurrent right hip dislocation. Facility course: 88 year old female with history of chronic respiratory failure secondary to COPD on 2 L oxygen , hypertension presents to the ER because of pain in the right hip was found to have right hip dislocation.  Has had recurrent episodes of these.  ER physician attempted to reduce the dislocation but in attempting patient became hypoxic.  The attempt was terminated.  Orthopedic surgeon Dr. Kay was consulted and plans to take patient to the OR at [facility].  Hospitalist requested admission because of medical issues including COPD and hypertension.  Metabolic panel lab is still pending.  Plan of care: The patient is accepted for admission to Telemetry unit, at [facility].  Author: Redia LOISE Cleaver, MD 02/10/2023  Check [email or web address] for on-call coverage.  Nursing staff, Please call [facility] Admits & Consults System-Wide number on Amion as soon as patient's arrival, so appropriate admitting provider can evaluate the pt.

## 2023-02-10 NOTE — Anesthesia Preprocedure Evaluation (Addendum)
 Anesthesia Evaluation  Patient identified by MRN, date of birth, ID band Patient awake    Reviewed: Allergy  & Precautions, NPO status , [name] Chart, lab work & pertinent test results  History of Anesthesia Complications Negative for: history of anesthetic complications  Airway Mallampati: II  TM Distance: >3 FB Neck ROM: Full    Dental  (+) Lower Dentures, Upper Dentures   Pulmonary COPD,  COPD inhaler and oxygen  dependent, former smoker   Pulmonary exam normal        Cardiovascular hypertension, Pt. on medications Normal cardiovascular exam   '23 TTE - EF 60 to 65%. Grade I diastolic dysfunction (impaired relaxation). There is mildly elevated pulmonary artery systolic pressure. Left atrial size was mildly dilated. No valve d/o     Neuro/Psych  PSYCHIATRIC DISORDERS Anxiety Depression    negative neurological ROS     GI/Hepatic Neg liver ROS,GERD  Medicated and Controlled,,  Endo/Other  Hypothyroidism    Renal/GU negative Renal ROS     Musculoskeletal  (+) Arthritis ,    Abdominal   Peds  Hematology negative hematology ROS (+)   Anesthesia Other Findings   Reproductive/Obstetrics                             Anesthesia Physical Anesthesia Plan  ASA: 3  Anesthesia Plan: Spinal   Post-op Pain Management: Tylenol  PO (pre-op)*   Induction: Intravenous  PONV Risk Score and Plan: 3 and Treatment may vary due to age or medical condition and Propofol  infusion  Airway Management Planned: Natural Airway and Simple Face Mask  Additional Equipment: None  Intra-op Plan:   Post-operative Plan:   Informed Consent: I have reviewed the patients History and Physical, chart, labs and discussed the procedure including the risks, benefits and alternatives for the proposed anesthesia with the patient or authorized representative who has indicated his/her understanding and acceptance.        Plan Discussed with: CRNA and Anesthesiologist  Anesthesia Plan Comments: (Labs reviewed, platelets acceptable. Discussed risks and benefits of spinal, including spinal/epidural hematoma, infection, failed block, and PDPH. Challenging spinal for previous hip surgery, eventually aborted and proceeded with GA. Discussed this with patient as potential again today. Patient expressed understanding and wished to proceed with spinal as primary plan with GA as backup. )        Anesthesia Quick Evaluation

## 2023-02-10 NOTE — Op Note (Signed)
 Priscilla Cantrell, HAMMILL MEDICAL RECORD NO: [id number] ACCOUNT NO: [id number] DATE OF BIRTH: Jun 27, 1935 FACILITY: [facility] LOCATION: [facility] PHYSICIAN: Donnice BIRCH. 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  PROCEDURE:  Revision right total hip arthroplasty and placement of constrained liner.  COMPONENTS USED:  DePuy constrained liner to match a 50 mm previously placed shell with a 28-mm inner diameter and a 28+1.5 ARTICUL/EZE metal ball.  SURGEON:  Donnice BIRCH.  Ernie, MD  ASSISTANT: Rosina Calin, PA-C.  Note that Ms. Calin was present for the entirety of the case from preoperative positioning, perioperative management of the operative extremity, general facilitation of the case and primary wound closure.  ANESTHESIA:  Spinal  BLOOD LOSS:  About 100 mL.  DRAINS:  None.  COMPLICATIONS:  None.  INDICATIONS FOR THE PROCEDURE:  The patient is an 88 year old female with history of right primary total hip arthroplasty performed in 2016 by one of my partners.  She has been seen recently with four episodes of instability of the right hip.  I had  talked to her in the office as recently as 10/2022 to discuss the concerns about operative intervention versus recurrent instability.  Unfortunately, she had a dislocation on 02/09/2023.  She was brought to an emergency room.  They were unable to reduce  her hip.  She was subsequently admitted to the hospital.  At this point, I had an opportunity to discuss proceeding with surgery to try and provide some stability to her hip given her age and medical comorbidities.  I reviewed the risks of recurrent  instability, infection, DVT, and need for future surgeries given her age and medical comorbidities including being oxygen -dependent.  Consent was  obtained for management of her instability.  DESCRIPTION OF PROCEDURE:  The patient was brought to the operative theater.  Once adequate anesthesia and preoperative antibiotics, Ancef  administered, Decadron  and tranexamic acid  she was positioned into the left lateral decubitus position with the  right hip up.  The right lower extremity was then prepped and draped in a sterile fashion.  A timeout was performed identifying the patient, planned procedure and extremity.  Her old incision was identified on her skin.  I incised and used the posterior  portion of the incision.  Soft tissue dissection was carried out and planes created.  I then incised for a posterior approach to the hip.  We then split the gluteal fascia and gluteal musculature to get to the hip.  The hip was noted to be dislocated  superior and posterior.  We then flexed and internally rotated the hip and removed the femoral head.  I was able to based on her soft tissue laxity, place the trunnion of the acetabular component onto the ilium and retract the femur anteriorly.  We  placed a separate retractor inferiorly.  I then debrided around the shell and then used the liner extraction device from the DePuy set.  The liner was removed.  At this point, based on our preoperative plan to go straight to a constrained liner, we  opened up the 15 x 28 mm neutral constrained liner.  Once I had the rim of the acetabular shell fully visualized, we placed the liner into the shell and impacted into place.  Once this was done, we selected a 28+1.5 ball and impacted on the clean and  dried  trunnion.  Reducing the hip was a little bit of a challenge to get it over the lip of the acetabular liner, but once I got it into the shell, we applied pressure and had an audible pop as the ball popped into the liner.  We then put the constrained  liner ring around the rim of the acetabular liner.  At this point, the hip was re-irrigated with normal saline solution.  I did  reapproximate the gluteal fascia and the iliotibial band using #1 Vicryl and #1 Stratafix suture.  The remainder of the wound  was closed in layers with 2-0 Vicryl and a running Monocryl stitch.  The hip was cleaned, dried, and dressed sterilely using surgical glue and Aquacel dressing.  The patient was brought to the recovery room in stable condition and tolerated the  procedure well.   PUS D: 02/10/2023 3:36:46 pm T: 02/10/2023 5:31:00 pm  JOB: [id number]/ [id number]

## 2023-02-10 NOTE — Consult Note (Signed)
 Reason for Consult: right total hip recurrent instability Referring Physician: Leotis, MD (Hospitalist), Kay, MD (Ortho)  Priscilla Cantrell is an 88 y.o. female.  HPI: Patient well known to [facility] who reports the immediate onset of pain after bending over to pick something up on the floor. She felt her right hip go out. She was transported to [facility] where XRAYs showed a right posterior hip dislocation. Despite conscious sedation the ER provider was unable to reduce the hip and referred the patient to [facility] for further eval and treatment. She only complains of right groin pain and denies back or neck pain or left leg pain. Patient is on home O2 and states that her breathing is fine now.   Past Medical History:  Diagnosis Date   Allergic rhinitis    with upper airway cough: as of 07/2017 pulm f/u she was instructed to use astelin , flonase , and saline more consistently   BPPV (benign paroxysmal positional vertigo) 03/2018   C. difficile colitis    Chest pain, non-cardiac    Cardiac CT showed no signif obstructive dz, mildly elevated calcium  level (Dr. Pietro).   Chronic cystitis with hematuria    Dr. Roman   Chronic hypoxemic respiratory failure (HCC) 02/02/2015   2L oxygen  24/7   Chronic renal insufficiency, stage 2 (mild)    GFR approx 60   Cold hands    NCS/EMGs normal.  Hand dysesthesias per neuro--reassured   COPD (chronic obstructive pulmonary disease) (HCC)    GOLD 4.  spirometry 01/10/09 FEV 0.97(52%), FEV1% 47.  With chronic bronchitis as of 07/2017.   DDD (degenerative disc disease), cervical    1998 MRI C5-6 impingemt (left).  MRI 2021 (neurol) 1 level canal stenosis, 1 level foraminal stenosis--->PT   DDD (degenerative disc disease), lumbar    Depression with anxiety 04/15/2011   Diarrheal disease Summer 2017   ? 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 Dr Luis).  As of 10/05/15 pt reports GI dx'd her with C diff and rx'd more flagyl  and she is also on cholestyramine .  As of 02/2016, pt's sx's resolved completely.   Diverticulosis of colon    Procto '97 and colonoscopy 2002   Dysplastic nevus of upper extremity 04/2014   R tricep (Dr. Livingston)   Edema of both lower extremities due to peripheral venous insufficiency    Varicosities bilat, swelling L>R.  R baker's cyst.  Got vein clinic eval 02/2018->sclerotherapy recommended but since no hemorrhage or ulceration, insurance will not cover the procedure.   Family history of adverse reaction to anesthesia    mother died when patient age 23 receiving anesthesia for thyroid  surgery   Fibrocystic breast disease    w/fibroadenoma.  Bx's showed NO atypia (Dr. Merrilyn).  Mammo neg 2008.   GERD (gastroesophageal reflux disease)    History of double vision    Ophthalmologist, Dr. Cleatus, is further evaluating this with MRI  orbits and limited brain (myesthenia gravis testing neg 07/2010)   History of home oxygen  therapy    uses 2 liters at hs   Hypertension    EKG 03/2010 normal   Hypothyroidism    Hashimoto's, dx'd 1989   Idiopathic peripheral neuropathy 04/2018   Sensory (numb feet) S1 distribution bilat, c/w spinal stenosis sensory neuropathy (no pain). 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  Labs [place], MRI C spine->some spondylosis/stenosis.  UE NCS/EMS: normal 04/2019.  Lesion of right native kidney 2017   found on CT 02/2015.  F/u MRI 03/31/15 showed it to be smaller and less likely of any significance but repeat CT renal protocol in 59mo was recommended by radiology.  This was done 10/26/15 and showed no interval change in the lesion, but b/c the lesion enhances with contrast, radiol rec'd urol consult (b/c pap renal RCC could not be excluded).  Saw urol 03/06/16--stable on u/s 09/2016 and 09/2017.   OAB (overactive bladder)     Osteoarthritis, hip, bilateral    she is s/p bilat THA as of 02/2018.   Osteoporosis    radius, T score -2.5->rec'd alendronate  08/2019->plan rpt DEXA 08/2021.   Pneumonia    Postmenopausal atrophic vaginitis    improved with estrace 2 X/week.   Prolapse of vaginal cuff after hysterectomy    10/2018: Dr. Louvenia. Mild colpocele 04/2019 per GYN (no surgery)   Pulmonary nodule    CT chest 12/10, June 2011.  No nodule on CT 06/2010.   Recurrent Clostridium difficile diarrhea    Episode 09/19/2016: resolved with prolonged course of flagyl .  11/2016 recurrence treated with 10d of Dificid (Fidaxomicin) by GI (Dr. Luis).   Recurrent UTI    likely related to pt's atrophic vaginitis.  Urol started vaginal estrace 03/2016.   Rib fracture 09/2018   R 7th, laterally-->sustained when her dog pulled her over and she fell.   RLS (restless legs syndrome)    neuro->gabapentin  trial 2020/21   Shingles 02/2014   R abd/flank   Spinal stenosis of lumbar region with neurogenic claudication    2008 MRI (L3-4, L4-5, L5-S1).  +S1 distribution sensory loss bilat    Past Surgical History:  Procedure Laterality Date   ABDOMINAL HYSTERECTOMY  1978   no BSO per pt--nonmalignant reasons   APPENDECTOMY     BREAST CYST EXCISION     Last screening mammogram 10/2010 was normal.   CHOLECYSTECTOMY  2001   COLONOSCOPY  04/2005; 12/06/2015   2007 NORMAL.  2017 adenomatous polyp x 1.  Mod sigmoid diverticulosis (Dr. Luis).     DEXA  05/18/2017; 08/7019   2019 NORMAL (T-score 0.1).  08/2019->T score -2.5->alendronate  started.  03/2022 T score -2.7   HIP CLOSED REDUCTION Right 12/25/2015   Procedure: CLOSED REDUCTION HIP;  Surgeon: Selinda Belvie Gosling, MD;  Location: [facility];  Service: Orthopedics;  Laterality: Right;   NCV/EMS  10/2018   chronic and symmetric sensorimotor axonal polyneuropathy affecting the lower extremities   TONSILLECTOMY     TOTAL HIP ARTHROPLASTY Right 11/01/2014   Procedure: RIGHT TOTAL HIP  ARTHROPLASTY;  Surgeon: Tanda Heading, MD;  Location: [facility];  Service: Orthopedics;  Laterality: Right;   TOTAL HIP ARTHROPLASTY Left 12/08/2017   Procedure: LEFT TOTAL HIP ARTHROPLASTY ANTERIOR APPROACH;  Surgeon: Ernie Cough, MD;  Location: [facility];  Service: Orthopedics;  Laterality: Left;  70 mins   TRANSTHORACIC ECHOCARDIOGRAM  08/2014   2016 EF 60-65%, grade I diast dysfxn. 06/21/21 unchanged.   TUBAL LIGATION  1974    Family History  Problem Relation Age of Onset   Goiter Mother    Psoriasis Father        od liver   Cirrhosis Father    Diabetes Daughter        Type 2   Stroke Maternal Grandfather    Stroke Paternal Grandmother    Hypertension Paternal Grandmother    Hernia Paternal Grandmother    Cancer Paternal Grandfather  lung   Cancer Maternal Aunt        cancer of the stomach    Social History:  reports that she quit smoking about 33 years ago. Her smoking use included cigarettes. She started smoking about 67 years ago. She has a 51 pack-year smoking history. She has never used smokeless tobacco. She reports current alcohol  use. She reports that she does not use drugs.  Allergies:  Allergies  Allergen Reactions   Losartan  Other (See Comments)    hyperkalemia   Penicillins Hives, Swelling and Rash    Has patient had a PCN reaction causing immediate rash, facial/tongue/throat swelling, SOB or lightheadedness with hypotension: YES Has patient had a PCN reaction causing severe rash involving mucus membranes or skin necrosis: NO Has patient had a PCN reaction that required hospitalization NO Has patient had a PCN reaction occurring within the last 10 years: NO If all of the above answers are NO, then may proceed with Cephalosporin use.    Cefixime [Kdc:Cefixime] Other (See Comments)    unspecified   Gabapentin  Other (See Comments)    Feels woozy   Indocin [Indomethacin] Other (See Comments)    Painful tongue and throat   Singulair  [Montelukast  Sodium]      Chest tightness, decreased mental clarity   Sulfa Antibiotics Other (See Comments)    unspecified   Ace Inhibitors Other (See Comments)    cough   Statins Other (See Comments)    Myalgias     Medications: I have reviewed the patient's current medications. Scheduled:  [MAR Hold] amLODipine   5 mg Oral Daily   chlorhexidine   60 mL Topical Once   [MAR Hold] fluticasone  furoate-vilanterol  1 puff Inhalation Daily   And   [MAR Hold] umeclidinium bromide   1 puff Inhalation Daily   [MAR Hold] levothyroxine   100 mcg Oral Q0600   [MAR Hold] melatonin  5 mg Oral Q24H   [MAR Hold] pantoprazole   40 mg Oral BID   povidone-iodine   2 Application Topical Once   povidone-iodine   2 Application Topical Once   [MAR Hold] propofol   0.5 mg/kg Intravenous Once   [MAR Hold] venlafaxine   50 mg Oral BID    Results for orders placed or performed during the hospital encounter of 02/09/23 (from the past 24 hours)  CBC     Status: Abnormal   Collection Time: 02/10/23 12:02 AM  Result Value Ref Range   WBC 11.1 (H) 4.0 - 10.5 K/uL   RBC 3.80 (L) 3.87 - 5.11 MIL/uL   Hemoglobin 11.3 (L) 12.0 - 15.0 g/dL   HCT 65.4 (L) 63.9 - 53.9 %   MCV 90.8 80.0 - 100.0 fL   MCH 29.7 26.0 - 34.0 pg   MCHC 32.8 30.0 - 36.0 g/dL   RDW 86.7 88.4 - 84.4 %   Platelets 193 150 - 400 K/uL   nRBC 0.0 0.0 - 0.2 %  Basic metabolic panel     Status: Abnormal   Collection Time: 02/10/23 12:02 AM  Result Value Ref Range   Sodium 142 135 - 145 mmol/L   Potassium 3.8 3.5 - 5.1 mmol/L   Chloride 108 98 - 111 mmol/L   CO2 28 22 - 32 mmol/L   Glucose, Bld 134 (H) 70 - 99 mg/dL   BUN 17 8 - 23 mg/dL   Creatinine, Ser 9.18 0.44 - 1.00 mg/dL   Calcium  8.7 (L) 8.9 - 10.3 mg/dL   GFR, Estimated >39 >39 mL/min   Anion gap 6 5 -  15  Type and screen [facility]     Status: None   Collection Time: 02/10/23  9:33 AM  Result Value Ref Range   ABO/RH(D) O POS    Antibody Screen NEG    Sample Expiration       02/13/2023,2359 Performed at [facility], [address] [place], [place] [address]    *Note: Due to a large number of results and/or encounters for the requested time period, some results have not been displayed. A complete set of results can be found in Results Review.     X-ray: CLINICAL DATA:  Felt in right hip pop, pain   EXAM: DG HIP (WITH OR WITHOUT PELVIS) 2-3V RIGHT   COMPARISON:  05/29/2022   FINDINGS: Patient is status post bilateral hip replacements. There is superior dislocation of the right hip replacement. No fracture. SI joints symmetric and unremarkable.   IMPRESSION: Bilateral hip replacements. Superior dislocation of the right hip replacement.     Electronically Signed   By: Franky Crease M.D.  ROS: As per HPI  Blood pressure (!) 172/71, pulse 71, temperature 98.5 F (36.9 C), temperature source Oral, resp. rate (!) 24, height 5' 2 (1.575 m), weight 54.4 kg, SpO2 95%.  Physical Exam: Physical Exam Patient is alert and oriented in mod distress. No tenderness of the CTL spine. Bilateral UEs with 5/5 strength and no pain with AROM. Nasal cannula O2 in place Right LE short and externally rotated. NVI Right LE Left LE with pain free PROM and AROM.  Assessment/Plan: 1.  History of right total hip arthroplasty with recurrent episodes of instability  Plan: Today I reviewed with her the condition of her right hip.  I have discussed with her in the office the concerns about the episodes of recurrent instability and the need to consider revising at least acetabular portion of her hip to a constrained liner based on her age and medical comorbidities.  She has been reluctant based on her age and medical comorbidities however noting that she has had now for dislocations in this past year I would recommend we proceed with revision to a constrained liner as opposed to a close reduction.  There is risks of recurrent instability with a constrained  liner however I think it would give her the best option at this point in an effort to try to prevent this from recurring. There would be risk of infection DVT further episodes of dislocation and the need for future surgeries.  Consent was obtained for the benefit of management of her right hip instability.  [name] 02/10/2023, 1:09 PM

## 2023-02-10 NOTE — Progress Notes (Signed)
 OT Cancellation Note  Patient Details Name: Priscilla Cantrell MRN: [id number] DOB: [birth year]   Cancelled Treatment:    Reason Eval/Treat Not Completed: Patient not medically ready;Medical issues which prohibited therapy Patient is NPO with bed rest order in place at this time with surgery scheduled later today. OT To sing off at this time. Please reorder if needed after surgical intervention.  Geofm LEYLAND, MS Acute Rehabilitation Department Office# [phone]  02/10/2023, 9:35 AM

## 2023-02-10 NOTE — Anesthesia Procedure Notes (Signed)
 Spinal  Patient location during procedure: OR Start time: 02/10/2023 2:09 PM End time: 02/10/2023 2:13 PM Reason for block: surgical anesthesia Staffing Performed: anesthesiologist  Anesthesiologist: Lucious Debby BRAVO, MD Performed by: Lucious Debby BRAVO, MD Authorized by: Lucious Debby BRAVO, MD   Preanesthetic Checklist Completed: patient identified, IV checked, risks and benefits discussed, surgical consent, monitors and equipment checked, pre-op evaluation and timeout performed Spinal Block Patient position: right lateral decubitus Prep: DuraPrep Patient monitoring: heart rate, cardiac monitor, continuous pulse ox and blood pressure Approach: midline Location: L3-4 Injection technique: single-shot Needle Needle type: Quincke  Needle gauge: 22 G Additional Notes Consent was obtained prior to the procedure with all questions answered and concerns addressed. Risks including, but not limited to, bleeding, infection, nerve damage, paralysis, failed block, inadequate analgesia, allergic reaction, high spinal, itching, and headache were discussed and the patient wished to proceed. Functioning IV was confirmed and monitors were applied. Sterile prep and drape, including hand hygiene, mask, and sterile gloves were used. The patient was positioned and the spine was prepped. The skin was anesthetized with lidocaine . Free flow of clear CSF was obtained prior to injecting local anesthetic into the CSF. The spinal needle aspirated freely following injection. The needle was carefully withdrawn. The patient tolerated the procedure well.   Debby Lucious, MD

## 2023-02-10 NOTE — Consult Note (Signed)
 Reason for Consult:Right hip prosthetic dislocation Referring Physician: Franky MD  Priscilla Cantrell is an 88 y.o. female.  HPI: Patient well known to [facility] who reports the immediate onset of pain after bending over to pick something up on the floor. She felt her right hip go out. She was transported to [facility] where XRAYs showed a right posterior hip dislocation. Despite conscious sedation the ER provider was unable to reduce the hip and referred the patient to [facility] for further eval and treatment. She only complains of right groin pain and denies back or neck pain or left leg pain. Patient is on home O2 and states that her breathing is fine now.   Past Medical History:  Diagnosis Date   Allergic rhinitis    with upper airway cough: as of 07/2017 pulm f/u she was instructed to use astelin , flonase , and saline more consistently   BPPV (benign paroxysmal positional vertigo) 03/2018   C. difficile colitis    Chest pain, non-cardiac    Cardiac CT showed no signif obstructive dz, mildly elevated calcium  level (Dr. Pietro).   Chronic cystitis with hematuria    Dr. Roman   Chronic hypoxemic respiratory failure (HCC) 02/02/2015   2L oxygen  24/7   Chronic renal insufficiency, stage 2 (mild)    GFR approx 60   Cold hands    NCS/EMGs normal.  Hand dysesthesias per neuro--reassured   COPD (chronic obstructive pulmonary disease) (HCC)    GOLD 4.  spirometry 01/10/09 FEV 0.97(52%), FEV1% 47.  With chronic bronchitis as of 07/2017.   DDD (degenerative disc disease), cervical    1998 MRI C5-6 impingemt (left).  MRI 2021 (neurol) 1 level canal stenosis, 1 level foraminal stenosis--->PT   DDD (degenerative disc disease), lumbar    Depression with anxiety 04/15/2011   Diarrheal disease Summer 2017   ? 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 Dr Luis).  As of 10/05/15 pt reports  GI dx'd her with C diff and rx'd more flagyl  and she is also on cholestyramine .  As of 02/2016, pt's sx's resolved completely.   Diverticulosis of colon    Procto '97 and colonoscopy 2002   Dysplastic nevus of upper extremity 04/2014   R tricep (Dr. Livingston)   Edema of both lower extremities due to peripheral venous insufficiency    Varicosities bilat, swelling L>R.  R baker's cyst.  Got vein clinic eval 02/2018->sclerotherapy recommended but since no hemorrhage or ulceration, insurance will not cover the procedure.   Family history of adverse reaction to anesthesia    mother died when patient age 62 receiving anesthesia for thyroid  surgery   Fibrocystic breast disease    w/fibroadenoma.  Bx's showed NO atypia (Dr. Merrilyn).  Mammo neg 2008.   GERD (gastroesophageal reflux disease)    History of double vision    Ophthalmologist, Dr. Cleatus, is further evaluating this with MRI  orbits and limited brain (myesthenia gravis testing neg 07/2010)   History of home oxygen  therapy    uses 2 liters at hs   Hypertension    EKG 03/2010 normal   Hypothyroidism    Hashimoto's, dx'd 1989   Idiopathic peripheral neuropathy 04/2018   Sensory (numb feet) S1 distribution bilat, c/w spinal stenosis sensory neuropathy (no pain). 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  Labs Ruth, MRI C spine->some spondylosis/stenosis.  UE NCS/EMS: normal 04/2019.   Lesion of right native kidney  2017   found on CT 02/2015.  F/u MRI 03/31/15 showed it to be smaller and less likely of any significance but repeat CT renal protocol in 13mo was recommended by radiology.  This was done 10/26/15 and showed no interval change in the lesion, but b/c the lesion enhances with contrast, radiol rec'd urol consult (b/c pap renal RCC could not be excluded).  Saw urol 03/06/16--stable on u/s 09/2016 and 09/2017.   OAB (overactive bladder)    Osteoarthritis, hip, bilateral    she is s/p bilat  THA as of 02/2018.   Osteoporosis    radius, T score -2.5->rec'd alendronate  08/2019->plan rpt DEXA 08/2021.   Pneumonia    Postmenopausal atrophic vaginitis    improved with estrace 2 X/week.   Prolapse of vaginal cuff after hysterectomy    10/2018: Dr. Louvenia. Mild colpocele 04/2019 per GYN (no surgery)   Pulmonary nodule    CT chest 12/10, June 2011.  No nodule on CT 06/2010.   Recurrent Clostridium difficile diarrhea    Episode 09/19/2016: resolved with prolonged course of flagyl .  11/2016 recurrence treated with 10d of Dificid (Fidaxomicin) by GI (Dr. Luis).   Recurrent UTI    likely related to pt's atrophic vaginitis.  Urol started vaginal estrace 03/2016.   Rib fracture 09/2018   R 7th, laterally-->sustained when her dog pulled her over and she fell.   RLS (restless legs syndrome)    neuro->gabapentin  trial 2020/21   Shingles 02/2014   R abd/flank   Spinal stenosis of lumbar region with neurogenic claudication    2008 MRI (L3-4, L4-5, L5-S1).  +S1 distribution sensory loss bilat    Past Surgical History:  Procedure Laterality Date   ABDOMINAL HYSTERECTOMY  1978   no BSO per pt--nonmalignant reasons   APPENDECTOMY     BREAST CYST EXCISION     Last screening mammogram 10/2010 was normal.   CHOLECYSTECTOMY  2001   COLONOSCOPY  04/2005; 12/06/2015   2007 NORMAL.  2017 adenomatous polyp x 1.  Mod sigmoid diverticulosis (Dr. Luis).     DEXA  05/18/2017; 08/7019   2019 NORMAL (T-score 0.1).  08/2019->T score -2.5->alendronate  started.  03/2022 T score -2.7   HIP CLOSED REDUCTION Right 12/25/2015   Procedure: CLOSED REDUCTION HIP;  Surgeon: Selinda Belvie Gosling, MD;  Location: [facility];  Service: Orthopedics;  Laterality: Right;   NCV/EMS  10/2018   chronic and symmetric sensorimotor axonal polyneuropathy affecting the lower extremities   TONSILLECTOMY     TOTAL HIP ARTHROPLASTY Right 11/01/2014   Procedure: RIGHT TOTAL HIP ARTHROPLASTY;  Surgeon: Tanda Heading, MD;  Location: [facility];   Service: Orthopedics;  Laterality: Right;   TOTAL HIP ARTHROPLASTY Left 12/08/2017   Procedure: LEFT TOTAL HIP ARTHROPLASTY ANTERIOR APPROACH;  Surgeon: Ernie Cough, MD;  Location: [facility];  Service: Orthopedics;  Laterality: Left;  70 mins   TRANSTHORACIC ECHOCARDIOGRAM  08/2014   2016 EF 60-65%, grade I diast dysfxn. 06/21/21 unchanged.   TUBAL LIGATION  1974    Family History  Problem Relation Age of Onset   Goiter Mother    Psoriasis Father        od liver   Cirrhosis Father    Diabetes Daughter        Type 2   Stroke Maternal Grandfather    Stroke Paternal Grandmother    Hypertension Paternal Grandmother    Hernia Paternal Grandmother    Cancer Paternal Grandfather        lung   Cancer Maternal  Aunt        cancer of the stomach    Social History:  reports that she quit smoking about 33 years ago. Her smoking use included cigarettes. She started smoking about 67 years ago. She has a 51 pack-year smoking history. She has never used smokeless tobacco. She reports current alcohol  use. She reports that she does not use drugs.  Allergies:  Allergies  Allergen Reactions   Losartan  Other (See Comments)    hyperkalemia   Penicillins Hives, Swelling and Rash    Has patient had a PCN reaction causing immediate rash, facial/tongue/throat swelling, SOB or lightheadedness with hypotension: YES Has patient had a PCN reaction causing severe rash involving mucus membranes or skin necrosis: NO Has patient had a PCN reaction that required hospitalization NO Has patient had a PCN reaction occurring within the last 10 years: NO If all of the above answers are NO, then may proceed with Cephalosporin use.    Cefixime [Kdc:Cefixime] Other (See Comments)    unspecified   Gabapentin  Other (See Comments)    Feels woozy   Indocin [Indomethacin] Other (See Comments)    Painful tongue and throat   Singulair  [Montelukast  Sodium]     Chest tightness, decreased mental clarity   Sulfa  Antibiotics Other (See Comments)    unspecified   Ace Inhibitors Other (See Comments)    cough   Statins Other (See Comments)    Myalgias     Medications: I have reviewed the patient's current medications.  Results for orders placed or performed during the hospital encounter of 02/09/23 (from the past 48 hours)  CBC     Status: Abnormal   Collection Time: 02/10/23 12:02 AM  Result Value Ref Range   WBC 11.1 (H) 4.0 - 10.5 K/uL   RBC 3.80 (L) 3.87 - 5.11 MIL/uL   Hemoglobin 11.3 (L) 12.0 - 15.0 g/dL   HCT 65.4 (L) 63.9 - 53.9 %   MCV 90.8 80.0 - 100.0 fL   MCH 29.7 26.0 - 34.0 pg   MCHC 32.8 30.0 - 36.0 g/dL   RDW 86.7 88.4 - 84.4 %   Platelets 193 150 - 400 K/uL   nRBC 0.0 0.0 - 0.2 %    Comment: Performed at [facility], [address], [place], [place] [address]  Basic metabolic panel     Status: Abnormal   Collection Time: 02/10/23 12:02 AM  Result Value Ref Range   Sodium 142 135 - 145 mmol/L   Potassium 3.8 3.5 - 5.1 mmol/L   Chloride 108 98 - 111 mmol/L   CO2 28 22 - 32 mmol/L   Glucose, Bld 134 (H) 70 - 99 mg/dL    Comment: Glucose reference range applies only to samples taken after fasting for at least 8 hours.   BUN 17 8 - 23 mg/dL   Creatinine, Ser 9.18 0.44 - 1.00 mg/dL   Calcium  8.7 (L) 8.9 - 10.3 mg/dL   GFR, Estimated >39 >39 mL/min    Comment: (NOTE) Calculated using the CKD-EPI Creatinine Equation (2021)    Anion gap 6 5 - 15    Comment: Performed at [facility], [address] [place], [place] [address]   *Note: Due to a large number of results and/or encounters for the requested time period, some results have not been displayed. A complete set of results can be found in Results Review.    DG HIP PORT UNILAT WITH PELVIS 1V RIGHT Result Date: 02/09/2023 CLINICAL DATA:  Postreduction EXAM: DG HIP (WITH OR WITHOUT PELVIS) 1V PORT RIGHT COMPARISON:  02/09/2023 FINDINGS: Prior right hip replacement. Continued  superior dislocation. No visible fracture. IMPRESSION: Continued superior dislocation of the right hip replacement. Electronically Signed   By: Franky Crease M.D.   On: 02/09/2023 22:07   DG Hip Unilat  With Pelvis 2-3 Views Right Result Date: 02/09/2023 CLINICAL DATA:  Felt in right hip pop, pain EXAM: DG HIP (WITH OR WITHOUT PELVIS) 2-3V RIGHT COMPARISON:  05/29/2022 FINDINGS: Patient is status post bilateral hip replacements. There is superior dislocation of the right hip replacement. No fracture. SI joints symmetric and unremarkable. IMPRESSION: Bilateral hip replacements. Superior dislocation of the right hip replacement. Electronically Signed   By: Franky Crease M.D.   On: 02/09/2023 20:57    Review of Systems Blood pressure (!) 172/71, pulse 71, temperature 98.5 F (36.9 C), temperature source Oral, resp. rate (!) 24, height 5' 2 (1.575 m), weight 54.4 kg, SpO2 95%. Physical Exam Patient is alert and oriented in mod distress. No tenderness of the CTL spine. Bilateral UEs with 5/5 strength and no pain with AROM. Right LE short and externally rotated. NVI Right LE Left LE with pain free PROM and AROM.  Assessment/Plan: Right total hip with dislocation. Patient has had four episodes of instability this year. Dr Ernie is aware and has discussed hip revision with the patient in the office. He will see her later today with reduction planned Maintain NPO and bed rest please.   Priscilla Cantrell Her 02/10/2023, 7:39 AM

## 2023-02-10 NOTE — Discharge Instructions (Signed)

## 2023-02-10 NOTE — Interval H&P Note (Signed)
 History and Physical Interval Note:  02/10/2023 1:27 PM  Priscilla Cantrell  has presented today for surgery, with the diagnosis of DISLOCATED RIGHT HIP.  The various methods of treatment have been discussed with the patient and family. After consideration of risks, benefits and other options for treatment, the patient has consented to  Procedure(s): CLOSED REDUCTION VS RIGHT TOTAL HIP REVISION (Right) as a surgical intervention.  The patient's history has been reviewed, patient examined, no change in status, stable for surgery.  I have reviewed the patient's chart and labs.  Questions were answered to the patient's satisfaction.     [name]

## 2023-02-10 NOTE — H&P (View-Only) (Signed)
 Reason for Consult:Right hip prosthetic dislocation Referring Physician: Franky MD  Priscilla Cantrell is an 88 y.o. female.  HPI: Patient well known to [facility] who reports the immediate onset of pain after bending over to pick something up on the floor. She felt her right hip go out. She was transported to [facility] where XRAYs showed a right posterior hip dislocation. Despite conscious sedation the ER provider was unable to reduce the hip and referred the patient to [facility] for further eval and treatment. She only complains of right groin pain and denies back or neck pain or left leg pain. Patient is on home O2 and states that her breathing is fine now.   Past Medical History:  Diagnosis Date   Allergic rhinitis    with upper airway cough: as of 07/2017 pulm f/u she was instructed to use astelin , flonase , and saline more consistently   BPPV (benign paroxysmal positional vertigo) 03/2018   C. difficile colitis    Chest pain, non-cardiac    Cardiac CT showed no signif obstructive dz, mildly elevated calcium  level (Dr. Pietro).   Chronic cystitis with hematuria    Dr. Roman   Chronic hypoxemic respiratory failure (HCC) 02/02/2015   2L oxygen  24/7   Chronic renal insufficiency, stage 2 (mild)    GFR approx 60   Cold hands    NCS/EMGs normal.  Hand dysesthesias per neuro--reassured   COPD (chronic obstructive pulmonary disease) (HCC)    GOLD 4.  spirometry 01/10/09 FEV 0.97(52%), FEV1% 47.  With chronic bronchitis as of 07/2017.   DDD (degenerative disc disease), cervical    1998 MRI C5-6 impingemt (left).  MRI 2021 (neurol) 1 level canal stenosis, 1 level foraminal stenosis--->PT   DDD (degenerative disc disease), lumbar    Depression with anxiety 04/15/2011   Diarrheal disease Summer 2017   ? 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 Dr Luis).  As of 10/05/15 pt reports  GI dx'd her with C diff and rx'd more flagyl  and she is also on cholestyramine .  As of 02/2016, pt's sx's resolved completely.   Diverticulosis of colon    Procto '97 and colonoscopy 2002   Dysplastic nevus of upper extremity 04/2014   R tricep (Dr. Livingston)   Edema of both lower extremities due to peripheral venous insufficiency    Varicosities bilat, swelling L>R.  R baker's cyst.  Got vein clinic eval 02/2018->sclerotherapy recommended but since no hemorrhage or ulceration, insurance will not cover the procedure.   Family history of adverse reaction to anesthesia    mother died when patient age 62 receiving anesthesia for thyroid  surgery   Fibrocystic breast disease    w/fibroadenoma.  Bx's showed NO atypia (Dr. Merrilyn).  Mammo neg 2008.   GERD (gastroesophageal reflux disease)    History of double vision    Ophthalmologist, Dr. Cleatus, is further evaluating this with MRI  orbits and limited brain (myesthenia gravis testing neg 07/2010)   History of home oxygen  therapy    uses 2 liters at hs   Hypertension    EKG 03/2010 normal   Hypothyroidism    Hashimoto's, dx'd 1989   Idiopathic peripheral neuropathy 04/2018   Sensory (numb feet) S1 distribution bilat, c/w spinal stenosis sensory neuropathy (no pain). 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  Labs Ruth, MRI C spine->some spondylosis/stenosis.  UE NCS/EMS: normal 04/2019.   Lesion of right native kidney  2017   found on CT 02/2015.  F/u MRI 03/31/15 showed it to be smaller and less likely of any significance but repeat CT renal protocol in 13mo was recommended by radiology.  This was done 10/26/15 and showed no interval change in the lesion, but b/c the lesion enhances with contrast, radiol rec'd urol consult (b/c pap renal RCC could not be excluded).  Saw urol 03/06/16--stable on u/s 09/2016 and 09/2017.   OAB (overactive bladder)    Osteoarthritis, hip, bilateral    she is s/p bilat  THA as of 02/2018.   Osteoporosis    radius, T score -2.5->rec'd alendronate  08/2019->plan rpt DEXA 08/2021.   Pneumonia    Postmenopausal atrophic vaginitis    improved with estrace 2 X/week.   Prolapse of vaginal cuff after hysterectomy    10/2018: Dr. Louvenia. Mild colpocele 04/2019 per GYN (no surgery)   Pulmonary nodule    CT chest 12/10, June 2011.  No nodule on CT 06/2010.   Recurrent Clostridium difficile diarrhea    Episode 09/19/2016: resolved with prolonged course of flagyl .  11/2016 recurrence treated with 10d of Dificid (Fidaxomicin) by GI (Dr. Luis).   Recurrent UTI    likely related to pt's atrophic vaginitis.  Urol started vaginal estrace 03/2016.   Rib fracture 09/2018   R 7th, laterally-->sustained when her dog pulled her over and she fell.   RLS (restless legs syndrome)    neuro->gabapentin  trial 2020/21   Shingles 02/2014   R abd/flank   Spinal stenosis of lumbar region with neurogenic claudication    2008 MRI (L3-4, L4-5, L5-S1).  +S1 distribution sensory loss bilat    Past Surgical History:  Procedure Laterality Date   ABDOMINAL HYSTERECTOMY  1978   no BSO per pt--nonmalignant reasons   APPENDECTOMY     BREAST CYST EXCISION     Last screening mammogram 10/2010 was normal.   CHOLECYSTECTOMY  2001   COLONOSCOPY  04/2005; 12/06/2015   2007 NORMAL.  2017 adenomatous polyp x 1.  Mod sigmoid diverticulosis (Dr. Luis).     DEXA  05/18/2017; 08/7019   2019 NORMAL (T-score 0.1).  08/2019->T score -2.5->alendronate  started.  03/2022 T score -2.7   HIP CLOSED REDUCTION Right 12/25/2015   Procedure: CLOSED REDUCTION HIP;  Surgeon: Selinda Belvie Gosling, MD;  Location: [facility];  Service: Orthopedics;  Laterality: Right;   NCV/EMS  10/2018   chronic and symmetric sensorimotor axonal polyneuropathy affecting the lower extremities   TONSILLECTOMY     TOTAL HIP ARTHROPLASTY Right 11/01/2014   Procedure: RIGHT TOTAL HIP ARTHROPLASTY;  Surgeon: Tanda Heading, MD;  Location: [facility];   Service: Orthopedics;  Laterality: Right;   TOTAL HIP ARTHROPLASTY Left 12/08/2017   Procedure: LEFT TOTAL HIP ARTHROPLASTY ANTERIOR APPROACH;  Surgeon: Ernie Cough, MD;  Location: [facility];  Service: Orthopedics;  Laterality: Left;  70 mins   TRANSTHORACIC ECHOCARDIOGRAM  08/2014   2016 EF 60-65%, grade I diast dysfxn. 06/21/21 unchanged.   TUBAL LIGATION  1974    Family History  Problem Relation Age of Onset   Goiter Mother    Psoriasis Father        od liver   Cirrhosis Father    Diabetes Daughter        Type 2   Stroke Maternal Grandfather    Stroke Paternal Grandmother    Hypertension Paternal Grandmother    Hernia Paternal Grandmother    Cancer Paternal Grandfather        lung   Cancer Maternal  Aunt        cancer of the stomach    Social History:  reports that she quit smoking about 33 years ago. Her smoking use included cigarettes. She started smoking about 67 years ago. She has a 51 pack-year smoking history. She has never used smokeless tobacco. She reports current alcohol  use. She reports that she does not use drugs.  Allergies:  Allergies  Allergen Reactions   Losartan  Other (See Comments)    hyperkalemia   Penicillins Hives, Swelling and Rash    Has patient had a PCN reaction causing immediate rash, facial/tongue/throat swelling, SOB or lightheadedness with hypotension: YES Has patient had a PCN reaction causing severe rash involving mucus membranes or skin necrosis: NO Has patient had a PCN reaction that required hospitalization NO Has patient had a PCN reaction occurring within the last 10 years: NO If all of the above answers are NO, then may proceed with Cephalosporin use.    Cefixime [Kdc:Cefixime] Other (See Comments)    unspecified   Gabapentin  Other (See Comments)    Feels woozy   Indocin [Indomethacin] Other (See Comments)    Painful tongue and throat   Singulair  [Montelukast  Sodium]     Chest tightness, decreased mental clarity   Sulfa  Antibiotics Other (See Comments)    unspecified   Ace Inhibitors Other (See Comments)    cough   Statins Other (See Comments)    Myalgias     Medications: I have reviewed the patient's current medications.  Results for orders placed or performed during the hospital encounter of 02/09/23 (from the past 48 hours)  CBC     Status: Abnormal   Collection Time: 02/10/23 12:02 AM  Result Value Ref Range   WBC 11.1 (H) 4.0 - 10.5 K/uL   RBC 3.80 (L) 3.87 - 5.11 MIL/uL   Hemoglobin 11.3 (L) 12.0 - 15.0 g/dL   HCT 65.4 (L) 63.9 - 53.9 %   MCV 90.8 80.0 - 100.0 fL   MCH 29.7 26.0 - 34.0 pg   MCHC 32.8 30.0 - 36.0 g/dL   RDW 86.7 88.4 - 84.4 %   Platelets 193 150 - 400 K/uL   nRBC 0.0 0.0 - 0.2 %    Comment: Performed at [facility], [address], [place], [place] [address]  Basic metabolic panel     Status: Abnormal   Collection Time: 02/10/23 12:02 AM  Result Value Ref Range   Sodium 142 135 - 145 mmol/L   Potassium 3.8 3.5 - 5.1 mmol/L   Chloride 108 98 - 111 mmol/L   CO2 28 22 - 32 mmol/L   Glucose, Bld 134 (H) 70 - 99 mg/dL    Comment: Glucose reference range applies only to samples taken after fasting for at least 8 hours.   BUN 17 8 - 23 mg/dL   Creatinine, Ser 9.18 0.44 - 1.00 mg/dL   Calcium  8.7 (L) 8.9 - 10.3 mg/dL   GFR, Estimated >39 >39 mL/min    Comment: (NOTE) Calculated using the CKD-EPI Creatinine Equation (2021)    Anion gap 6 5 - 15    Comment: Performed at [facility], [address] [place], [place] [address]   *Note: Due to a large number of results and/or encounters for the requested time period, some results have not been displayed. A complete set of results can be found in Results Review.    DG HIP PORT UNILAT WITH PELVIS 1V RIGHT Result Date: 02/09/2023 CLINICAL DATA:  Postreduction EXAM: DG HIP (WITH OR WITHOUT PELVIS) 1V PORT RIGHT COMPARISON:  02/09/2023 FINDINGS: Prior right hip replacement. Continued  superior dislocation. No visible fracture. IMPRESSION: Continued superior dislocation of the right hip replacement. Electronically Signed   By: Franky Crease M.D.   On: 02/09/2023 22:07   DG Hip Unilat  With Pelvis 2-3 Views Right Result Date: 02/09/2023 CLINICAL DATA:  Felt in right hip pop, pain EXAM: DG HIP (WITH OR WITHOUT PELVIS) 2-3V RIGHT COMPARISON:  05/29/2022 FINDINGS: Patient is status post bilateral hip replacements. There is superior dislocation of the right hip replacement. No fracture. SI joints symmetric and unremarkable. IMPRESSION: Bilateral hip replacements. Superior dislocation of the right hip replacement. Electronically Signed   By: Franky Crease M.D.   On: 02/09/2023 20:57    Review of Systems Blood pressure (!) 172/71, pulse 71, temperature 98.5 F (36.9 C), temperature source Oral, resp. rate (!) 24, height 5' 2 (1.575 m), weight 54.4 kg, SpO2 95%. Physical Exam Patient is alert and oriented in mod distress. No tenderness of the CTL spine. Bilateral UEs with 5/5 strength and no pain with AROM. Right LE short and externally rotated. NVI Right LE Left LE with pain free PROM and AROM.  Assessment/Plan: Right total hip with dislocation. Patient has had four episodes of instability this year. Dr Ernie is aware and has discussed hip revision with the patient in the office. He will see her later today with reduction planned Maintain NPO and bed rest please.   Priscilla Cantrell Her 02/10/2023, 7:39 AM

## 2023-02-10 NOTE — Brief Op Note (Signed)
 02/09/2023 - 02/10/2023  3:29 PM  PATIENT:  Priscilla Cantrell  88 y.o. female  PRE-OPERATIVE DIAGNOSIS:  recurrent right hip instability/dislocations status post right total hip replacement  POST-OPERATIVE DIAGNOSIS:  recurrent right hip instability/dislocations status post right total hip replacement  PROCEDURE:  Procedure(s): RIGHT TOTAL HIP REVISION (Right), constrained liner  SURGEON:  Surgeons and Role:    DEWAINE Ernie Cough, MD - Primary  PHYSICIAN ASSISTANT: Rosina Calin, PA-C  ANESTHESIA:   spinal  EBL:  100 mL   BLOOD ADMINISTERED:none  DRAINS: none   LOCAL MEDICATIONS USED:  NONE  SPECIMEN:  No Specimen  DISPOSITION OF SPECIMEN:  N/A  COUNTS:  YES  TOURNIQUET:  * No tourniquets in log *  DICTATION: .Other Dictation: Dictation Number [id number]  PLAN OF CARE: Admit to inpatient   PATIENT DISPOSITION:  PACU - hemodynamically stable.   Delay start of Pharmacological VTE agent (>24hrs) due to surgical blood loss or risk of bleeding: no

## 2023-02-11 DIAGNOSIS — S73004A Unspecified dislocation of right hip, initial encounter: Secondary | ICD-10-CM | POA: Diagnosis not present

## 2023-02-11 LAB — CBC
HCT: 35.1 % — ABNORMAL LOW (ref 36.0–46.0)
Hemoglobin: 11.3 g/dL — ABNORMAL LOW (ref 12.0–15.0)
MCH: 30.3 pg (ref 26.0–34.0)
MCHC: 32.2 g/dL (ref 30.0–36.0)
MCV: 94.1 fL (ref 80.0–100.0)
Platelets: 185 10*3/uL (ref 150–400)
RBC: 3.73 MIL/uL — ABNORMAL LOW (ref 3.87–5.11)
RDW: 12.7 % (ref 11.5–15.5)
WBC: 11.8 10*3/uL — ABNORMAL HIGH (ref 4.0–10.5)
nRBC: 0 % (ref 0.0–0.2)

## 2023-02-11 LAB — BASIC METABOLIC PANEL
Anion gap: 8 (ref 5–15)
BUN: 16 mg/dL (ref 8–23)
CO2: 24 mmol/L (ref 22–32)
Calcium: 8.6 mg/dL — ABNORMAL LOW (ref 8.9–10.3)
Chloride: 100 mmol/L (ref 98–111)
Creatinine, Ser: 0.69 mg/dL (ref 0.44–1.00)
GFR, Estimated: >60 mL/min (ref >60)
Glucose, Bld: 135 mg/dL — ABNORMAL HIGH (ref 70–99)
Potassium: 4 mmol/L (ref 3.5–5.1)
Sodium: 132 mmol/L — ABNORMAL LOW (ref 135–145)

## 2023-02-11 NOTE — NC FL2 (Signed)
 [place]  MEDICAID FL2 LEVEL OF CARE FORM     IDENTIFICATION  Patient Name: Priscilla Cantrell Birthdate: Jun 25, 1935 Sex: female Admission Date (Current Location): 02/09/2023  [facility] and [place] Number:  Producer, Television/film/video and Address:  [facility],  [address], [place] [address]      Provider Number: [id number]  Attending Physician Name and Address:  Leotis Bogus, MD  Relative Name and Phone Number:  [name] Banks (significant other) Ph: [phone]    Current Level of Care: Hospital Recommended Level of Care: Skilled Nursing Facility Prior Approval Number:    Date Approved/Denied:   PASRR Number: [id number]  Discharge Plan: SNF    Current Diagnoses: Patient Active Problem List   Diagnosis Date Noted   Hip dislocation, right, initial encounter (HCC) 02/10/2023   Sepsis (HCC) 02/22/2022   Acute on chronic respiratory failure with hypoxia (HCC) 02/21/2022   Influenza A 02/21/2022   Shortness of breath 12/08/2019   Swelling of lower extremity 12/08/2019   Stress due to family tension 12/08/2019   Chronic cystitis with hematuria 08/15/2019   Urinary urgency 08/15/2019   Multiple closed fractures of ribs of right side 10/05/2018   Healthcare maintenance 10/05/2018   Medication management 07/29/2018   Overweight (BMI 25.0-29.9) 12/09/2017   S/P left KA 12/08/2017   S/P left THA, AA 12/08/2017   Postherpetic neuralgia 03/07/2015   Constipation 02/28/2015   Renal mass 02/28/2015   Acute pyelonephritis 02/20/2015   Oral thrush 02/08/2015   Chronic hypoxemic respiratory failure (HCC) 02/02/2015   SOB (shortness of breath)    Essential hypertension 01/24/2015   UTI (urinary tract infection) 01/09/2015   Pain and swelling of right lower leg 01/09/2015   H/O total hip arthroplasty 11/01/2014   Spinal stenosis, lumbar region, with neurogenic claudication 01/04/2014   Osteoarthritis, hip, bilateral 07/27/2013   Sciatica associated with disorder of  lumbosacral spine 04/19/2013   Atrophic vaginitis 01/12/2012   Periodic limb movement disorder 11/24/2011   Hypoxia 05/22/2011   Depression with anxiety 04/15/2011   Chronic venous insufficiency 07/29/2010   Hypothyroidism due to Hashimoto's thyroiditis 01/07/2010   SPINAL STENOSIS 01/07/2010   Allergic rhinitis 08/09/2007   COPD GOLD III B 08/09/2007    Orientation RESPIRATION BLADDER Height & Weight     Self, Time, Situation, Place  O2 (4L/min) Continent Weight: 120 lb (54.4 kg) Height:  5' 2 (157.5 cm)  BEHAVIORAL SYMPTOMS/MOOD NEUROLOGICAL BOWEL NUTRITION STATUS      Continent Diet (Regular diet)  AMBULATORY STATUS COMMUNICATION OF NEEDS Skin   Limited Assist Verbally Surgical wounds                       Personal Care Assistance Level of Assistance  Bathing, Feeding, Dressing Bathing Assistance: Limited assistance Feeding assistance: Independent Dressing Assistance: Limited assistance     Functional Limitations Info  Sight, Hearing, Speech Sight Info: Impaired Hearing Info: Adequate Speech Info: Adequate    SPECIAL CARE FACTORS FREQUENCY  PT (By licensed PT), OT (By licensed OT)     PT Frequency: 5x's/week OT Frequency: 5x's/week            Contractures Contractures Info: Not present    Additional Factors Info  Code Status, Allergies, Psychotropic Code Status Info: Full Allergies Info: Losartan , Penicillins, Cefixime (Xir:[id number]), Gabapentin , Indocin (Indomethacin), Singulair  (Montelukast  Sodium), Sulfa Antibiotics, Ace Inhibitors, Statins Psychotropic Info: See discharge summary         Current Medications (02/11/2023):  This is the current hospital  active medication list Current Facility-Administered Medications  Medication Dose Route Frequency Provider Last Rate Last Admin   acetaminophen  (TYLENOL ) tablet 650 mg  650 mg Oral Q6H Patti Rosina SAUNDERS, PA-C   650 mg at 02/11/23 1000   ALPRAZolam  (XANAX ) tablet 0.5 mg  0.5 mg Oral BID PRN Patti Rosina SAUNDERS, PA-C   0.5 mg at 02/10/23 2118   alum & mag hydroxide-simeth (MAALOX/MYLANTA) 200-200-20 MG/5ML suspension 30 mL  30 mL Oral Q4H PRN Patti Rosina SAUNDERS, PA-C       amLODipine  (NORVASC ) tablet 5 mg  5 mg Oral Daily Patti Rosina SAUNDERS, PA-C   5 mg at 02/11/23 0940   aspirin  chewable tablet 81 mg  81 mg Oral BID Donovan, Ashley R, PA-C   81 mg at 02/11/23 0940   bisacodyl  (DULCOLAX) suppository 10 mg  10 mg Rectal Daily PRN Patti Rosina SAUNDERS, PA-C       diphenhydrAMINE  (BENADRYL ) 12.5 MG/5ML elixir 12.5-25 mg  12.5-25 mg Oral Q4H PRN Patti Rosina SAUNDERS, PA-C       fluticasone  furoate-vilanterol (BREO ELLIPTA ) 100-25 MCG/ACT 1 puff  1 puff Inhalation Daily Patti Rosina SAUNDERS, PA-C   1 puff at 02/11/23 9173   And   umeclidinium bromide  (INCRUSE ELLIPTA ) 62.5 MCG/ACT 1 puff  1 puff Inhalation Daily Patti Rosina SAUNDERS, PA-C   1 puff at 02/11/23 9173   HYDROmorphone  (DILAUDID ) injection 0.5 mg  0.5 mg Intravenous Q4H PRN Patti Rosina SAUNDERS, PA-C       levothyroxine  (SYNTHROID ) tablet 100 mcg  100 mcg Oral Q0600 Donovan, Ashley R, PA-C   100 mcg at 02/11/23 0636   melatonin tablet 5 mg  5 mg Oral Q24H Patti Rosina SAUNDERS, PA-C   5 mg at 02/10/23 2119   menthol -cetylpyridinium (CEPACOL) lozenge 3 mg  1 lozenge Oral PRN Patti Rosina SAUNDERS, PA-C       Or   phenol (CHLORASEPTIC) mouth spray 1 spray  1 spray Mouth/Throat PRN Patti Rosina SAUNDERS, PA-C       methocarbamol  (ROBAXIN ) tablet 500 mg  500 mg Oral Q6H PRN Patti Rosina SAUNDERS, PA-C       Or   methocarbamol  (ROBAXIN ) injection 500 mg  500 mg Intravenous Q6H PRN Patti Rosina SAUNDERS, PA-C       metoCLOPramide  (REGLAN ) tablet 5-10 mg  5-10 mg Oral Q8H PRN Patti Rosina SAUNDERS, PA-C       Or   metoCLOPramide  (REGLAN ) injection 5-10 mg  5-10 mg Intravenous Q8H PRN Patti Rosina SAUNDERS, PA-C       ondansetron  (ZOFRAN ) tablet 4 mg  4 mg Oral Q6H PRN Patti Rosina SAUNDERS, PA-C       Or   ondansetron  (ZOFRAN ) injection 4 mg  4 mg Intravenous Q6H PRN Patti Rosina SAUNDERS, PA-C    4 mg at 02/10/23 1934   oxyCODONE  (Oxy IR/ROXICODONE ) immediate release tablet 2.5-5 mg  2.5-5 mg Oral Q4H PRN Patti Rosina SAUNDERS, PA-C   5 mg at 02/10/23 2123   oxyCODONE  (Oxy IR/ROXICODONE ) immediate release tablet 5-10 mg  5-10 mg Oral Q4H PRN Patti Rosina SAUNDERS, PA-C       pantoprazole  (PROTONIX ) EC tablet 40 mg  40 mg Oral BID Patti Rosina SAUNDERS, PA-C   40 mg at 02/11/23 0940   polyethylene glycol (MIRALAX  / GLYCOLAX ) packet 17 g  17 g Oral BID Patti Rosina SAUNDERS, PA-C   17 g at 02/10/23 2119   senna (SENOKOT) tablet 17.2 mg  2 tablet Oral QHS Patti Rosina SAUNDERS,  PA-C   17.2 mg at 02/10/23 2119   sodium chloride  flush (NS) 0.9 % injection 3-10 mL  3-10 mL Intravenous Q12H Patti Rosina SAUNDERS, PA-C   10 mL at 02/11/23 9056   sodium chloride  flush (NS) 0.9 % injection 3-10 mL  3-10 mL Intravenous PRN Patti Rosina SAUNDERS, PA-C       venlafaxine  (EFFEXOR ) tablet 50 mg  50 mg Oral BID Donovan, Ashley R, PA-C   50 mg at 02/11/23 1100     Discharge Medications: Please see discharge summary for a list of discharge medications.  Relevant Imaging Results:  Relevant Lab Results:   Additional Information SSN: [id number]  Duwaine GORMAN Aran, LCSW

## 2023-02-11 NOTE — Progress Notes (Signed)
 Subjective: 1 Day Post-Op Procedure(s) (LRB): RIGHT TOTAL HIP REVISION (Right) Patient reports pain as mild.   Patient seen in rounds for Dr. Ernie. Patient is resting in bed on exam this morning. No acute events overnight. Patient reports she was comfortable all night and slept. We discussed intra-op findings and plan moving forward.  We will start therapy today.   Objective: Vital signs in last 24 hours: Temp:  [97.4 F (36.3 C)-99.1 F (37.3 C)] 98 F (36.7 C) (01/08 0803) Pulse Rate:  [58-80] 61 (01/08 0803) Resp:  [15-20] 18 (01/08 0418) BP: (111-174)/(57-95) 140/59 (01/08 0803) SpO2:  [91 %-100 %] 95 % (01/08 0826)  Intake/Output from previous day:  Intake/Output Summary (Last 24 hours) at 02/11/2023 0852 Last data filed at 02/11/2023 0421 Gross per 24 hour  Intake 484.67 ml  Output 1200 ml  Net -715.33 ml     Intake/Output this shift: No intake/output data recorded.  Labs: Recent Labs    02/10/23 0002 02/11/23 0403  HGB 11.3* 11.3*   Recent Labs    02/10/23 0002 02/11/23 0403  WBC 11.1* 11.8*  RBC 3.80* 3.73*  HCT 34.5* 35.1*  PLT 193 185   Recent Labs    02/10/23 0002 02/11/23 0403  NA 142 132*  K 3.8 4.0  CL 108 100  CO2 28 24  BUN 17 16  CREATININE 0.81 0.69  GLUCOSE 134* 135*  CALCIUM  8.7* 8.6*   No results for input(s): LABPT, INR in the last 72 hours.  Exam: General - Patient is Alert and Oriented Extremity - Neurologically intact Sensation intact distally Intact pulses distally Dorsiflexion/Plantar flexion intact Dressing - dressing C/D/I Motor Function - intact, moving foot and toes well on exam.   Past Medical History:  Diagnosis Date   Allergic rhinitis    with upper airway cough: as of 07/2017 pulm f/u she was instructed to use astelin , flonase , and saline more consistently   BPPV (benign paroxysmal positional vertigo) 03/2018   C. difficile colitis    Chest pain, non-cardiac    Cardiac CT showed no signif obstructive  dz, mildly elevated calcium  level (Dr. Pietro).   Chronic cystitis with hematuria    Dr. Roman   Chronic hypoxemic respiratory failure (HCC) 02/02/2015   2L oxygen  24/7   Chronic renal insufficiency, stage 2 (mild)    GFR approx 60   Cold hands    NCS/EMGs normal.  Hand dysesthesias per neuro--reassured   COPD (chronic obstructive pulmonary disease) (HCC)    GOLD 4.  spirometry 01/10/09 FEV 0.97(52%), FEV1% 47.  With chronic bronchitis as of 07/2017.   DDD (degenerative disc disease), cervical    1998 MRI C5-6 impingemt (left).  MRI 2021 (neurol) 1 level canal stenosis, 1 level foraminal stenosis--->PT   DDD (degenerative disc disease), lumbar    Depression with anxiety 04/15/2011   Diarrheal disease Summer 2017   ? 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 Dr Luis).  As of 10/05/15 pt reports GI dx'd her with C diff and rx'd more flagyl  and she is also on cholestyramine .  As of 02/2016, pt's sx's resolved completely.   Diverticulosis of colon    Procto '97 and colonoscopy 2002   Dysplastic nevus of upper extremity 04/2014   R tricep (Dr. Livingston)   Edema of both lower extremities due to peripheral venous insufficiency    Varicosities bilat, swelling L>R.  R baker's cyst.  Got vein clinic eval  02/2018->sclerotherapy recommended but since no hemorrhage or ulceration, insurance will not cover the procedure.   Family history of adverse reaction to anesthesia    mother died when patient age 78 receiving anesthesia for thyroid  surgery   Fibrocystic breast disease    w/fibroadenoma.  Bx's showed NO atypia (Dr. Merrilyn).  Mammo neg 2008.   GERD (gastroesophageal reflux disease)    History of double vision    Ophthalmologist, Dr. Cleatus, is further evaluating this with MRI  orbits and limited brain (myesthenia gravis testing neg 07/2010)   History of home oxygen  therapy    uses 2 liters at hs    Hypertension    EKG 03/2010 normal   Hypothyroidism    Hashimoto's, dx'd 1989   Idiopathic peripheral neuropathy 04/2018   Sensory (numb feet) S1 distribution bilat, c/w spinal stenosis sensory neuropathy (no pain). 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  Labs [place], MRI C spine->some spondylosis/stenosis.  UE NCS/EMS: normal 04/2019.   Lesion of right native kidney 2017   found on CT 02/2015.  F/u MRI 03/31/15 showed it to be smaller and less likely of any significance but repeat CT renal protocol in 53mo was recommended by radiology.  This was done 10/26/15 and showed no interval change in the lesion, but b/c the lesion enhances with contrast, radiol rec'd urol consult (b/c pap renal RCC could not be excluded).  Saw urol 03/06/16--stable on u/s 09/2016 and 09/2017.   OAB (overactive bladder)    Osteoarthritis, hip, bilateral    she is s/p bilat THA as of 02/2018.   Osteoporosis    radius, T score -2.5->rec'd alendronate  08/2019->plan rpt DEXA 08/2021.   Pneumonia    Postmenopausal atrophic vaginitis    improved with estrace 2 X/week.   Prolapse of vaginal cuff after hysterectomy    10/2018: Dr. Louvenia. Mild colpocele 04/2019 per GYN (no surgery)   Pulmonary nodule    CT chest 12/10, June 2011.  No nodule on CT 06/2010.   Recurrent Clostridium difficile diarrhea    Episode 09/19/2016: resolved with prolonged course of flagyl .  11/2016 recurrence treated with 10d of Dificid (Fidaxomicin) by GI (Dr. Luis).   Recurrent UTI    likely related to pt's atrophic vaginitis.  Urol started vaginal estrace 03/2016.   Rib fracture 09/2018   R 7th, laterally-->sustained when her dog pulled her over and she fell.   RLS (restless legs syndrome)    neuro->gabapentin  trial 2020/21   Shingles 02/2014   R abd/flank   Spinal stenosis of lumbar region with neurogenic claudication    2008 MRI (L3-4, L4-5, L5-S1).  +S1 distribution sensory loss bilat     Assessment/Plan: 1 Day Post-Op Procedure(s) (LRB): RIGHT TOTAL HIP REVISION (Right) Principal Problem:   Hip dislocation, right, initial encounter (HCC)  Estimated body mass index is 21.95 kg/m as calculated from the following:   Height as of this encounter: 5' 2 (1.575 m).   Weight as of this encounter: 54.4 kg. Advance diet Up with therapy  DVT Prophylaxis - Aspirin  81 mg chewable BID x4 weeks Weight bearing as tolerated. Posterior hip precautions  Her preference is for d/c home with significant other however complicated by the fact that he has hernia surgery ?today  Up with PT today  Pain control Dressing is waterproof and will remain in place unless soiled  F/U in our office in 2 weeks  Rosina Calin, PA-C Orthopedic Surgery [phone] 02/11/2023, 8:52 AM

## 2023-02-11 NOTE — TOC Progression Note (Signed)
 Transition of Care [facility]) - Progression Note   Patient Details  Name: Priscilla Cantrell MRN: [id number] Date of Birth: 1935-06-23  [facility] CM/SW Contact  Duwaine GORMAN Aran, LCSW Phone Number: 02/11/2023, 12:29 PM  Clinical Narrative: PT evaluation recommended SNF vs. HH. CSW met with patient to discuss recommendations. Patient is agreeable to CSW starting SNF bed search as she is unsure if she will be able to return home with [facility] at this time as she will have limited support from her significant other. FL2 done; PASRR confirmed. Initial referral faxed out in hub. TOC awaiting bed offers.  Expected Discharge Plan: Skilled Nursing Facility Barriers to Discharge: Continued Medical Work up  Expected Discharge Plan and Services In-house Referral: Clinical Social Work Post Acute Care Choice: Skilled Nursing Facility Living arrangements for the past 2 months: Single Family Home  Social Determinants of Health (SDOH) Interventions SDOH Screenings   Food Insecurity: No Food Insecurity (02/10/2023)  Housing: Low Risk  (02/10/2023)  Transportation Needs: No Transportation Needs (02/10/2023)  Utilities: Not At Risk (02/10/2023)  Alcohol  Screen: Low Risk  (02/01/2020)  Depression (PHQ2-9): Low Risk  (11/12/2022)  Financial Resource Strain: Low Risk  (03/19/2022)  Physical Activity: Sufficiently Active (03/19/2022)  Social Connections: Socially Integrated (02/10/2023)  Stress: No Stress Concern Present (03/19/2022)  Tobacco Use: Medium Risk (02/10/2023)   Readmission Risk Interventions     No data to display

## 2023-02-11 NOTE — Progress Notes (Signed)
 PROGRESS NOTE    ROTUNDA WORDEN  FMW:[id number] DOB: 06-15-1935 DOA: 02/09/2023 PCP: Candise Aleene DEL, MD   Brief Narrative:  This 88 yrs old female with PMH  significant for chronic hypoxic respiratory failure secondary to COPD on 2 L of supplemental oxygen  at baseline, hypertension, hypothyroidism, dementia, anxiety, and recurrent hip dislocation.  Patient presented status post recurrent hip dislocation.  She stated while picking something from the ground She has heard the pop, she has experienced multiple dislocations in the past and She felt the pain seems similar to the past and she is found to have superior dislocation of the right hip replacement.  Patient was evaluated by orthopedics and recommended to have close reduction or revision of the hip replacement in the OR.   Assessment & Plan:   Principal Problem:   Hip dislocation, right, initial encounter (HCC)   Right hip dislocation; - Ortho consulted by EDP.  Appreciate Orthopedics consultation and management - Adequate Pain control  - Medically optimized for surgery; Recent Echo LVEF 06/2021 60-65%. - She is on 2L O2 at baseline and SPO2 currently 95% on 3L.  - Status Post closed reduction,  right total hip revision / POD 1. -Continue aspirin  81 mg twice daily for 4 weeks. - Weightbearing as tolerated. - PT/OT evaluation post-op  Chronic hypoxic respiratory failure: COPD: On 2L O2 via nasal cannula at baseline - Continue home O2 and Trelegy Ellipta  - PRN nebulizers while inpatient   Essential Hypertension: - Continue home amlodipine .   Hypothyroidism: - Continue home Synthroid    Dementia: Generalized Anxiety Disorder: Per chart review does fine during the day but has significant sundowing at night - Trial 5 mg Melatonin at 1800 - Continue home Venlaxafine and PRN Xanax    DVT prophylaxis: SCDs Code Status: Full code Family Communication: No family at bedside Disposition Plan:  Status is: Inpatient Remains  inpatient appropriate because: Admitted for recurrent right hip dislocation.  Patient underwent closed reduction/ revision in the OR.  POD #1  Consultants:  Orthopaedics  Procedures: Antimicrobials:  Anti-infectives (From admission, onward)    Start     Dose/Rate Route Frequency Ordered Stop   02/11/23 0600  ceFAZolin  (ANCEF ) IVPB 2g/100 mL premix  Status:  Discontinued        2 g 200 mL/hr over 30 Minutes Intravenous On call to O.R. 02/10/23 1212 02/10/23 1224   02/10/23 2000  vancomycin  (VANCOCIN ) IVPB 1000 mg/200 mL premix        1,000 mg 200 mL/hr over 60 Minutes Intravenous Every 12 hours 02/10/23 1722 02/10/23 2227   02/10/23 1430  vancomycin  (VANCOCIN ) IVPB 1000 mg/200 mL premix  Status:  Discontinued        1,000 mg 200 mL/hr over 60 Minutes Intravenous To [facility] 02/10/23 1225 02/10/23 1722   02/10/23 1430  levofloxacin  (LEVAQUIN ) IVPB 750 mg  Status:  Discontinued        750 mg 100 mL/hr over 90 Minutes Intravenous  Once 02/10/23 1225 02/10/23 1722      Subjective: Patient was seen and examined at bedside.  Overnight events noted.   Patient was sitting comfortably on the chair.  Status post reduction. POD 1  Objective: Vitals:   02/11/23 0418 02/11/23 0803 02/11/23 0826 02/11/23 1233  BP: (!) 153/78 (!) 140/59  (!) 130/53  Pulse: 65 61  64  Resp: 18   16  Temp: 98.1 F (36.7 C) 98 F (36.7 C)  98.6 F (37 C)  TempSrc: Oral Oral  SpO2: 96% 97% 95% 95%  Weight:      Height:        Intake/Output Summary (Last 24 hours) at 02/11/2023 1403 Last data filed at 02/11/2023 1000 Gross per 24 hour  Intake 604.67 ml  Output 1200 ml  Net -595.33 ml   Filed Weights   02/09/23 2100 02/10/23 0156  Weight: 54.4 kg 54.4 kg    Examination:  General exam: Appears calm and comfortable, deconditioned, not in any acute distress. Respiratory system: Clear to auscultation. Respiratory effort normal. RR15 Cardiovascular system: S1 & S2 heard, RRR. No JVD,  murmurs, rubs, gallops or clicks. No pedal edema. Gastrointestinal system: Abdomen is non distended, soft and non tender.  Normal bowel sounds heard. Central nervous system: Alert and oriented X3 . No focal neurological deficits. Extremities: Status post right hip reduction POD #1 Skin: No rashes, lesions or ulcers Psychiatry: Judgement and insight appear normal. Mood & affect appropriate.     Data Reviewed: I have personally reviewed following labs and imaging studies  CBC: Recent Labs  Lab 02/10/23 0002 02/11/23 0403  WBC 11.1* 11.8*  HGB 11.3* 11.3*  HCT 34.5* 35.1*  MCV 90.8 94.1  PLT 193 185   Basic Metabolic Panel: Recent Labs  Lab 02/10/23 0002 02/11/23 0403  NA 142 132*  K 3.8 4.0  CL 108 100  CO2 28 24  GLUCOSE 134* 135*  BUN 17 16  CREATININE 0.81 0.69  CALCIUM  8.7* 8.6*   GFR: Estimated Creatinine Clearance: 40.7 mL/min (by C-G formula based on SCr of 0.69 mg/dL). Liver Function Tests: No results for input(s): AST, ALT, ALKPHOS, BILITOT, PROT, ALBUMIN in the last 168 hours. No results for input(s): LIPASE, AMYLASE in the last 168 hours. No results for input(s): AMMONIA in the last 168 hours. Coagulation Profile: No results for input(s): INR, PROTIME in the last 168 hours. Cardiac Enzymes: No results for input(s): CKTOTAL, CKMB, CKMBINDEX, TROPONINI in the last 168 hours. BNP (last 3 results) No results for input(s): PROBNP in the last 8760 hours. HbA1C: No results for input(s): HGBA1C in the last 72 hours. CBG: No results for input(s): GLUCAP in the last 168 hours. Lipid Profile: No results for input(s): CHOL, HDL, LDLCALC, TRIG, CHOLHDL, LDLDIRECT in the last 72 hours. Thyroid  Function Tests: No results for input(s): TSH, T4TOTAL, FREET4, T3FREE, THYROIDAB in the last 72 hours. Anemia Panel: No results for input(s): VITAMINB12, FOLATE, FERRITIN, TIBC, IRON, RETICCTPCT in the last  72 hours. Sepsis Labs: No results for input(s): PROCALCITON, LATICACIDVEN in the last 168 hours.  No results found for this or any previous visit (from the past 240 hours).   Radiology Studies: DG Pelvis Portable Result Date: 02/10/2023 CLINICAL DATA:  Status post right total hip arthroplasty revision EXAM: PORTABLE PELVIS 1-2 VIEWS COMPARISON:  02/09/2023 for recurrent instability FINDINGS: Right hip prosthesis noted with new constrained liner in place. No periprosthetic fracture or acute complicating feature. The patient also has an existing left hip prosthesis. IMPRESSION: 1. Right hip prosthesis with new constrained liner in place. No acute complicating feature. Electronically Signed   By: Ryan Salvage M.D.   On: 02/10/2023 19:56   DG HIP PORT UNILAT WITH PELVIS 1V RIGHT Result Date: 02/09/2023 CLINICAL DATA:  Postreduction EXAM: DG HIP (WITH OR WITHOUT PELVIS) 1V PORT RIGHT COMPARISON:  02/09/2023 FINDINGS: Prior right hip replacement. Continued superior dislocation. No visible fracture. IMPRESSION: Continued superior dislocation of the right hip replacement. Electronically Signed   By: Franky Crease M.D.   On:  02/09/2023 22:07   DG Hip Unilat  With Pelvis 2-3 Views Right Result Date: 02/09/2023 CLINICAL DATA:  Felt in right hip pop, pain EXAM: DG HIP (WITH OR WITHOUT PELVIS) 2-3V RIGHT COMPARISON:  05/29/2022 FINDINGS: Patient is status post bilateral hip replacements. There is superior dislocation of the right hip replacement. No fracture. SI joints symmetric and unremarkable. IMPRESSION: Bilateral hip replacements. Superior dislocation of the right hip replacement. Electronically Signed   By: Franky Crease M.D.   On: 02/09/2023 20:57    Scheduled Meds:  acetaminophen   650 mg Oral Q6H   amLODipine   5 mg Oral Daily   aspirin   81 mg Oral BID   fluticasone  furoate-vilanterol  1 puff Inhalation Daily   And   umeclidinium bromide   1 puff Inhalation Daily   levothyroxine   100 mcg Oral  Q0600   melatonin  5 mg Oral Q24H   pantoprazole   40 mg Oral BID   polyethylene glycol  17 g Oral BID   senna  2 tablet Oral QHS   sodium chloride  flush  3-10 mL Intravenous Q12H   venlafaxine   50 mg Oral BID   Continuous Infusions:   LOS: 1 day    Time spent: 35 mins    Darcel Dawley, MD [facility]   If 7PM-7AM, please contact night-coverage

## 2023-02-11 NOTE — Anesthesia Postprocedure Evaluation (Signed)
 Anesthesia Post Note  Patient: Priscilla Cantrell  Procedure(s) Performed: RIGHT TOTAL HIP REVISION (Right: Hip)     Patient location during evaluation: PACU Anesthesia Type: Spinal Level of consciousness: awake and alert Pain management: pain level controlled Vital Signs Assessment: post-procedure vital signs reviewed and stable Respiratory status: spontaneous breathing, nonlabored ventilation, respiratory function stable and patient connected to nasal cannula oxygen  Cardiovascular status: blood pressure returned to baseline and stable Postop Assessment: no apparent nausea or vomiting and spinal receding Anesthetic complications: no   No notable events documented.  Last Vitals:  Vitals:   02/11/23 0100 02/11/23 0418  BP:  (!) 153/78  Pulse:  65  Resp: 17 18  Temp:  36.7 C  SpO2:  96%    Last Pain:  Vitals:   02/11/23 0637  TempSrc:   PainSc: 0-No pain                 Debby FORBES Like

## 2023-02-11 NOTE — Evaluation (Signed)
 Physical Therapy Evaluation Patient Details Name: Priscilla Cantrell MRN: [id number] DOB: September 13, 1935 Today's Date: 02/11/2023  History of Present Illness  88 yo female admitted with hip dislocation. S/P R THA rev + constrained liner 02/10/23. Hx of R THA with multiple procedures/surgeries 2* dislocation, RSV, COVID, COPD-O2 dep 2L, LE edema, flu, BPPV, Cdiff, peripheral neuralgia, RLS, osteoporosis, shingles, spinal stenosis, L THA 2019, falls  Clinical Impression  On eval, pt was CGA for mobility. She walked ~75 feet with a RW. Minimal pain during session. O2 93% on 3L with ambulation. Pt reports she was planning to d/c home with her significant other [name], however, [name] is having hernia surgery on today. Unsure of d/c plan currently, may need to consider ST SNF (it pt is agreeable) if pt will not have any help at home. Would recommend she remain in hospital to continue to work with therapy (continued education, work towards regaining independence with functional mobility) until appropriate d/c plan is in place. Recommend TOC consult and OT eval.       If plan is discharge home, recommend the following: A little help with walking and/or transfers;A little help with bathing/dressing/bathroom;Assistance with cooking/housework;Assist for transportation;Help with stairs or ramp for entrance   Can travel by private vehicle        Equipment Recommendations Rolling walker (2 wheels);BSC/3in1  Recommendations for Other Services  OT consult    Functional Status Assessment Patient has had a recent decline in their functional status and demonstrates the ability to make significant improvements in function in a reasonable and predictable amount of time.     Precautions / Restrictions Precautions Precautions: Fall;Posterior Hip Precaution Booklet Issued: Yes (comment) Precaution Comments: Reviewed hip precautions-pt able to recall 1/3 Restrictions Weight Bearing Restrictions Per Provider Order: No RLE  Weight Bearing Per Provider Order: Weight bearing as tolerated      Mobility  Bed Mobility Overal bed mobility: Needs Assistance Bed Mobility: Supine to Sit     Supine to sit: Contact guard, HOB elevated     General bed mobility comments: Cues for safety, adherence to precautions. Increased time. No assistance required.    Transfers Overall transfer level: Needs assistance Equipment used: Rolling walker (2 wheels) Transfers: Sit to/from Stand Sit to Stand: Contact guard assist           General transfer comment: Cues for safety, technique, hand/LE placement. Increased time.    Ambulation/Gait Ambulation/Gait assistance: Contact guard assist Gait Distance (Feet): 75 Feet Assistive device: Rolling walker (2 wheels) Gait Pattern/deviations: Step-through pattern, Decreased stride length       General Gait Details: Cues for safety, posture. Slow but steady gait with RW. Pt denied dizziness. O2 93% on 3L. Tolerated distance well.  Stairs            Wheelchair Mobility     Tilt Bed    Modified Rankin (Stroke Patients Only)       Balance Overall balance assessment: Needs assistance, History of Falls         Standing balance support: Bilateral upper extremity supported, Reliant on assistive device for balance, During functional activity Standing balance-Leahy Scale: Poor                               Pertinent Vitals/Pain Pain Assessment Pain Assessment: Faces Faces Pain Scale: Hurts little more Pain Location: R hip with activity Pain Descriptors / Indicators: Discomfort, Sore Pain Intervention(s): Monitored during session, Repositioned  Home Living Family/patient expects to be discharged to:: Private residence Living Arrangements: Spouse/significant other   Type of Home: House Home Access: Stairs to enter   Secretary/administrator of Steps: back 1+1   Home Layout: One level Home Equipment: Adaptive equipment Additional  Comments: pt reports she sold most of her DME    Prior Function Prior Level of Function : Independent/Modified Independent             Mobility Comments: no AD, 2L O2 baseline       Extremity/Trunk Assessment   Upper Extremity Assessment Upper Extremity Assessment: Defer to OT evaluation    Lower Extremity Assessment Lower Extremity Assessment: Generalized weakness    Cervical / Trunk Assessment Cervical / Trunk Assessment: Normal  Communication   Communication Communication: No apparent difficulties  Cognition Arousal: Alert Behavior During Therapy: WFL for tasks assessed/performed                                            General Comments      Exercises     Assessment/Plan    PT Assessment Patient needs continued PT services  PT Problem List Decreased strength;Decreased range of motion;Decreased activity tolerance;Decreased balance;Decreased mobility;Decreased knowledge of use of DME;Pain       PT Treatment Interventions DME instruction;Gait training;Stair training;Functional mobility training;Therapeutic activities;Therapeutic exercise;Patient/family education;Balance training    PT Goals (Current goals can be found in the Care Plan section)  Acute Rehab PT Goals Patient Stated Goal: home PT Goal Formulation: With patient Time For Goal Achievement: 02/25/23 Potential to Achieve Goals: Good    Frequency Min 1X/week     Co-evaluation               AM-PAC PT 6 Clicks Mobility  Outcome Measure Help needed turning from your back to your side while in a flat bed without using bedrails?: A Little Help needed moving from lying on your back to sitting on the side of a flat bed without using bedrails?: A Little Help needed moving to and from a bed to a chair (including a wheelchair)?: A Little Help needed standing up from a chair using your arms (e.g., wheelchair or bedside chair)?: A Little Help needed to walk in hospital room?:  A Little Help needed climbing 3-5 steps with a railing? : A Little 6 Click Score: 18    End of Session Equipment Utilized During Treatment: Gait belt Activity Tolerance: Patient tolerated treatment well Patient left: in chair;with call bell/phone within reach;with chair alarm set   PT Visit Diagnosis: Pain;History of falling (Z91.81);Other abnormalities of gait and mobility (R26.89) Pain - Right/Left: Right Pain - part of body: Hip    Time: 1110-1141 PT Time Calculation (min) (ACUTE ONLY): 31 min   Charges:   PT Evaluation $PT Eval Low Complexity: 1 Low PT Treatments $Gait Training: 8-22 mins PT General Charges $$ ACUTE PT VISIT: 1 Visit            Dannial SQUIBB, PT Acute Rehabilitation  Office: [phone]

## 2023-02-12 ENCOUNTER — Ambulatory Visit: Payer: Medicare HMO | Admitting: Family Medicine

## 2023-02-12 ENCOUNTER — Encounter (HOSPITAL_COMMUNITY): Payer: Self-pay | Admitting: Orthopedic Surgery

## 2023-02-12 DIAGNOSIS — S73004A Unspecified dislocation of right hip, initial encounter: Secondary | ICD-10-CM | POA: Diagnosis not present

## 2023-02-12 NOTE — Progress Notes (Signed)
 Physical Therapy Treatment Patient Details Name: Priscilla Cantrell MRN: [id number] DOB: 22-May-1935 Today's Date: 02/12/2023   History of Present Illness 88 yo female admitted with hip dislocation. S/P R THA rev + constrained liner 02/10/23. Hx of R THA with multiple procedures/surgeries 2* dislocation, RSV, COVID, COPD-O2 dep 2L, LE edema, flu, BPPV, Cdiff, peripheral neuralgia, RLS, osteoporosis, shingles, spinal stenosis, L THA 2019, falls    PT Comments  Patient progressing with ambulation distance and able to stand at sink for functional tasks.  However, she is unsafe needing mod cues for hip precautions during mobility and ADL tasks.  She has had falls and in higher risk currently with new surgery and now significant other unable to assist.  Continue to recommend inpatient rehab (<3 hours/day) prior to d/c home.    If plan is discharge home, recommend the following: A little help with walking and/or transfers;A little help with bathing/dressing/bathroom;Assistance with cooking/housework;Assist for transportation;Help with stairs or ramp for entrance   Can travel by private vehicle     Yes  Equipment Recommendations  Rolling walker (2 wheels);BSC/3in1    Recommendations for Other Services       Precautions / Restrictions Precautions Precautions: Fall;Posterior Hip Precaution Comments: reinforced precautions during mobility Restrictions RLE Weight Bearing Per Provider Order: Weight bearing as tolerated LLE Weight Bearing Per Provider Order: Weight bearing as tolerated     Mobility  Bed Mobility Overal bed mobility: Needs Assistance Bed Mobility: Supine to Sit     Supine to sit: HOB elevated, Used rails, Contact guard     General bed mobility comments: increased time, assist for scooting forward    Transfers Overall transfer level: Needs assistance Equipment used: Rolling walker (2 wheels) Transfers: Sit to/from Stand Sit to Stand: Contact guard assist            General transfer comment: assist for balance, cues for technique esp stand to sit for hip precautions    Ambulation/Gait Ambulation/Gait assistance: Contact guard assist Gait Distance (Feet): 100 Feet Assistive device: Rolling walker (2 wheels) Gait Pattern/deviations: Step-to pattern, Step-through pattern, Decreased stride length       General Gait Details: assist for balance and safety   Stairs             Wheelchair Mobility     Tilt Bed    Modified Rankin (Stroke Patients Only)       Balance Overall balance assessment: Needs assistance, History of Falls   Sitting balance-Leahy Scale: Good Sitting balance - Comments: on toilet in bathroom able to obtain toilet paper, cued to stand to perform hygiene due to precautions   Standing balance support: During functional activity, No upper extremity supported Standing balance-Leahy Scale: Poor Standing balance comment: CGA throughout during activities in bathroom for toilet hygiene, washing hands, combing hair, etc.                            Cognition Arousal: Alert Behavior During Therapy: WFL for tasks assessed/performed Overall Cognitive Status: No family/caregiver present to determine baseline cognitive functioning                                 General Comments: Patient pleasant and cooperative though no idea about hip precautions needing cues throughout esp in bathroom on toilet, she perseverated on her spouse not being able to drive and her either which is why she is agreeable  to rehab, when MD in the room seemed to think he was her primary MD though educated on hospitalist role.        Exercises Total Joint Exercises Ankle Circles/Pumps: AROM, 10 reps, Both, Supine Short Arc Quad: AROM, 10 reps, Both, Supine Heel Slides: AROM, AAROM, 10 reps, Both, Supine    General Comments        Pertinent Vitals/Pain Pain Assessment Pain Assessment: No/denies pain Faces Pain Scale:  Hurts a little bit Pain Location: R hip with activity Pain Descriptors / Indicators: Discomfort Pain Intervention(s): Monitored during session    Home Living                          Prior Function            PT Goals (current goals can now be found in the care plan section) Progress towards PT goals: Progressing toward goals    Frequency    Min 1X/week      PT Plan      Co-evaluation              AM-PAC PT 6 Clicks Mobility   Outcome Measure  Help needed turning from your back to your side while in a flat bed without using bedrails?: A Little Help needed moving from lying on your back to sitting on the side of a flat bed without using bedrails?: A Little Help needed moving to and from a bed to a chair (including a wheelchair)?: A Little Help needed standing up from a chair using your arms (e.g., wheelchair or bedside chair)?: A Little Help needed to walk in hospital room?: A Little Help needed climbing 3-5 steps with a railing? : Total 6 Click Score: 16    End of Session Equipment Utilized During Treatment: Gait belt Activity Tolerance: Patient tolerated treatment well Patient left: in chair;with chair alarm set;with call bell/phone within reach   PT Visit Diagnosis: History of falling (Z91.81);Other abnormalities of gait and mobility (R26.89)     Time: 0940-1009 PT Time Calculation (min) (ACUTE ONLY): 29 min  Charges:    $Gait Training: 8-22 mins $Therapeutic Activity: 8-22 mins PT General Charges $$ ACUTE PT VISIT: 1 Visit                     Micheline Portal, PT Acute Rehabilitation Services Office:[phone] 02/12/2023    Montie Portal 02/12/2023, 1:40 PM

## 2023-02-12 NOTE — Evaluation (Signed)
 Occupational Therapy Evaluation Patient Details Name: Priscilla Cantrell MRN: [id number] DOB: 16-Sep-1935 Today's Date: 02/12/2023   History of Present Illness 88 yo female admitted with hip dislocation. S/P R THA rev + constrained liner 02/10/23. Hx of R THA with multiple procedures/surgeries 2* dislocation, RSV, COVID, COPD-O2 dep 2L, LE edema, flu, BPPV, Cdiff, peripheral neuralgia, RLS, osteoporosis, shingles, spinal stenosis, L THA 2019, falls   Clinical Impression   Pt admitted with above diagnosis. Pt was independent in all ADLs prior to admission, lives with significant other [name]. Of note, [name] had hernia surgery yesterday, and may not be able to provide level of physical assist pt requires.  Pt currently requires MAX assist for LB dressing while in seated position due to pain and limited AROM of R hip. Completes short bout of mobility in room with CGA + RW ~20 ft on 2L.   Pt able to recall 1/3 posterior total hip precautions at start of session and unable to verbalize how to implement during ADL and mobility. Pt instructed in posterior total hip precautions and how to implement, self care skills, falls prevention strategies, home/routines modifications, DME/AE for LB bathing and dressing tasks, functional transfer techniques.  At end of session, pt able to recall 2/3 posterior total hip precautions and is unable to verbalize how to implement during functional tasks. Pt would benefit from additional instruction in self care skills and techniques to help maintain precautions with or without assistive devices to support recall and carryover prior to discharge. Patient will benefit from continued inpatient follow up therapy, <3 hours/day and requires 24/7 supervision.        If plan is discharge home, recommend the following: A little help with walking and/or transfers;A little help with bathing/dressing/bathroom;Assistance with cooking/housework;Direct supervision/assist for medications  management;Direct supervision/assist for financial management;Assist for transportation;Supervision due to cognitive status    Functional Status Assessment  Patient has had a recent decline in their functional status and demonstrates the ability to make significant improvements in function in a reasonable and predictable amount of time.  Equipment Recommendations  BSC/3in1;Tub/shower bench;Other (comment) (hip kit (sock aide, LH sponge, reacher))    Recommendations for Other Services       Precautions / Restrictions Precautions Precautions: Fall;Posterior Hip Precaution Comments: pt recalls 1/3 post hip precautions Restrictions Weight Bearing Restrictions Per Provider Order: Yes RLE Weight Bearing Per Provider Order: Weight bearing as tolerated LLE Weight Bearing Per Provider Order: Weight bearing as tolerated      Mobility Bed Mobility               General bed mobility comments: up in recliner start and end of session    Transfers Overall transfer level: Needs assistance Equipment used: Rolling walker (2 wheels) Transfers: Sit to/from Stand Sit to Stand: Contact guard assist           General transfer comment: cues for technique      Balance Overall balance assessment: Needs assistance, History of Falls Sitting-balance support: No upper extremity supported Sitting balance-Leahy Scale: Good     Standing balance support: During functional activity, No upper extremity supported Standing balance-Leahy Scale: Poor Standing balance comment: CGA throughout bout of mobility in room ~20 ft                           ADL either performed or assessed with clinical judgement   ADL Overall ADL's : Needs assistance/impaired Eating/Feeding: Independent;Sitting   Grooming: Contact guard assist;Standing  Upper Body Bathing: Sitting;Contact guard assist   Lower Body Bathing: Maximal assistance;Adhering to hip precautions;Sit to/from stand Lower Body  Bathing Details (indicate cue type and reason): antipate MAX for hip precaution adherence due to STM deficits Upper Body Dressing : Set up;Sitting   Lower Body Dressing: Maximal assistance;Adhering to back precautions;Sit to/from stand Lower Body Dressing Details (indicate cue type and reason): anticipate MAX due to poor recall and carryover of 3/3 hip precautions Toilet Transfer: Contact guard assist;Rolling walker (2 wheels);BSC/3in1;Ambulation Toilet Transfer Details (indicate cue type and reason): STS transfers completed from recliner Toileting- Clothing Manipulation and Hygiene: Minimal assistance;Adhering to hip precautions;Sit to/from stand Toileting - Clothing Manipulation Details (indicate cue type and reason): will require cues to maintain precautions     Functional mobility during ADLs: Cueing for safety;Cueing for sequencing;Rolling walker (2 wheels);Contact guard assist General ADL Comments: Pt will require max cuing to maintain precautions during functional tasks. Unable to recall precautions, and cannot verbalize how to perform LB ADLs with precautions after education provided     Vision Baseline Vision/History: 1 Wears glasses Ability to See in Adequate Light: 0 Adequate Patient Visual Report: No change from baseline              Pertinent Vitals/Pain Pain Assessment Pain Assessment: No/denies pain     Extremity/Trunk Assessment Upper Extremity Assessment Upper Extremity Assessment: Right hand dominant;Overall [facility] for tasks assessed   Lower Extremity Assessment Lower Extremity Assessment: Defer to PT evaluation   Cervical / Trunk Assessment Cervical / Trunk Assessment: Normal   Communication Communication Communication: No apparent difficulties Cueing Techniques: Verbal cues;Visual cues;Gestural cues   Cognition Arousal: Alert Behavior During Therapy: WFL for tasks assessed/performed Overall Cognitive Status: No family/caregiver present to determine  baseline cognitive functioning                                 General Comments: Recalls 1/3 hip precautions, unable to recall throughout session despite 3+ attempts using visual handout education provided by therapist. Decreased STM - pt often repeating same stories, and tangetial. Did not remember PT coming to work with her earlier today\        Exercises Exercises: Other exercises Other Exercises Other Exercises: Educated on role and purpose of OT in acute care, discussed discharge recommendations Other Exercises: Edu on transfer techniques for ADLs - TTB, shower chair, discussed AE and hip kit using verbal + visual demonstration   Shoulder Instructions      Home Living Family/patient expects to be discharged to:: Private residence Living Arrangements: Spouse/significant other Available Help at Discharge: Available 24 hours/day Type of Home: House Home Access: Stairs to enter [employer] of Steps: back 1+1   Home Layout: One level     Bathroom Shower/Tub: Walk-in shower;Tub/shower unit   Bathroom Toilet: Standard Bathroom Accessibility: Yes How Accessible: Accessible via walker Home Equipment: Shower seat;Grab bars - tub/shower;Hand held shower head;Adaptive equipment lexicographer) Adaptive Equipment: Reacher        Prior Functioning/Environment Prior Level of Function : Independent/Modified Independent             Mobility Comments: no AD, 2L O2 baseline ADLs Comments: independent        OT Problem List: Decreased range of motion;Decreased strength;Decreased activity tolerance;Impaired balance (sitting and/or standing);Decreased cognition;Decreased safety awareness;Decreased knowledge of use of DME or AE;Decreased knowledge of precautions;Cardiopulmonary status limiting activity      OT Treatment/Interventions: Self-care/ADL training;Neuromuscular education;DME and/or AE  instruction;Therapeutic activities;Patient/family education;Balance  training    OT Goals(Current goals can be found in the care plan section) Acute Rehab OT Goals OT Goal Formulation: With patient Time For Goal Achievement: 02/26/23 Potential to Achieve Goals: Fair  OT Frequency: Min 1X/week       AM-PAC OT 6 Clicks Daily Activity     Outcome Measure Help from another person eating meals?: None Help from another person taking care of personal grooming?: None Help from another person toileting, which includes using toliet, bedpan, or urinal?: A Little Help from another person bathing (including washing, rinsing, drying)?: A Lot Help from another person to put on and taking off regular upper body clothing?: A Little Help from another person to put on and taking off regular lower body clothing?: A Lot 6 Click Score: 18   End of Session Equipment Utilized During Treatment: Rolling walker (2 wheels);Gait belt;Oxygen  Nurse Communication: Mobility status  Activity Tolerance: Patient tolerated treatment well Patient left: in chair;with call bell/phone within reach;with chair alarm set  OT Visit Diagnosis: Muscle weakness (generalized) (M62.81);History of falling (Z91.81);Unsteadiness on feet (R26.81)                Time: 8664-8584 OT Time Calculation (min): 40 min Charges:  OT General Charges $OT Visit: 1 Visit OT Evaluation $OT Eval Low Complexity: 1 Low OT Treatments $Self Care/Home Management : 23-37 mins  Ryann Leavitt L. Yentl Verge, OTR/L  02/12/23, 2:23 PM

## 2023-02-12 NOTE — Progress Notes (Signed)
 PROGRESS NOTE    Priscilla Cantrell  FMW:[id number] DOB: Oct 12, 1935 DOA: 02/09/2023 PCP: Candise Aleene DEL, MD   Brief Narrative:  This 88 yrs old female with PMH  significant for chronic hypoxic respiratory failure secondary to COPD on 2 L of supplemental oxygen  at baseline, hypertension, hypothyroidism, dementia, anxiety, and recurrent hip dislocation.  Patient presented status post recurrent hip dislocation.  She stated while picking something from the ground She has heard the pop, she has experienced multiple dislocations in the past and She felt the pain seems similar to the past and she is found to have superior dislocation of the right hip replacement.  Patient was evaluated by orthopedics and recommended to have close reduction or revision of the hip replacement in the OR.   Assessment & Plan:   Principal Problem:   Hip dislocation, right, initial encounter (HCC)   Right hip dislocation; - Ortho consulted by EDP.  Appreciate Orthopedics consultation and management - Adequate Pain control  - Medically optimized for surgery; Recent Echo LVEF 06/2021 60-65%. - She is on 2L O2 at baseline and SPO2 currently 95% on 3L.  - Status Post closed reduction,  right total hip revision / POD 2. - Continue aspirin  81 mg twice daily for 4 weeks. - Weightbearing as tolerated. - PT/OT evaluation post-op  Chronic hypoxic respiratory failure: COPD: On 2L O2 via nasal cannula at baseline - Continue home O2 and Trelegy Ellipta . - PRN nebulizers while inpatient.   Essential Hypertension: - Continue home amlodipine .   Hypothyroidism: - Continue home Synthroid    Dementia: Generalized Anxiety Disorder: Per chart review does fine during the day but has significant sundowing at night - Trial 5 mg Melatonin at 1800 - Continue home Venlaxafine and PRN Xanax    DVT prophylaxis: SCDs Code Status: Full code Family Communication: No family at bedside Disposition Plan:  Status is: Inpatient Remains  inpatient appropriate because: Admitted for recurrent right hip dislocation.  Patient underwent closed reduction/ revision in the OR.  POD #1  Consultants:  Orthopaedics  Procedures: Antimicrobials:  Anti-infectives (From admission, onward)    Start     Dose/Rate Route Frequency Ordered Stop   02/11/23 0600  ceFAZolin  (ANCEF ) IVPB 2g/100 mL premix  Status:  Discontinued        2 g 200 mL/hr over 30 Minutes Intravenous On call to O.R. 02/10/23 1212 02/10/23 1224   02/10/23 2000  vancomycin  (VANCOCIN ) IVPB 1000 mg/200 mL premix        1,000 mg 200 mL/hr over 60 Minutes Intravenous Every 12 hours 02/10/23 1722 02/10/23 2227   02/10/23 1430  vancomycin  (VANCOCIN ) IVPB 1000 mg/200 mL premix  Status:  Discontinued        1,000 mg 200 mL/hr over 60 Minutes Intravenous To ShortStay Surgical 02/10/23 1225 02/10/23 1722   02/10/23 1430  levofloxacin  (LEVAQUIN ) IVPB 750 mg  Status:  Discontinued        750 mg 100 mL/hr over 90 Minutes Intravenous  Once 02/10/23 1225 02/10/23 1722      Subjective: Patient was seen and examined at bedside.  Overnight events noted.   Patient was sitting comfortably on the chair.  Status post reduction. POD 2. She states pain is reasonably controlled.  Objective: Vitals:   02/12/23 0150 02/12/23 0527 02/12/23 0809 02/12/23 0918  BP: (!) 150/67 (!) 161/71  (!) 131/58  Pulse: 62 61    Resp: 18 18    Temp: 97.8 F (36.6 C) 98.5 F (36.9 C)    TempSrc:  Oral Oral    SpO2: 99% 99% 97%   Weight:      Height:        Intake/Output Summary (Last 24 hours) at 02/12/2023 1223 Last data filed at 02/12/2023 0600 Gross per 24 hour  Intake 360 ml  Output --  Net 360 ml   Filed Weights   02/09/23 2100 02/10/23 0156  Weight: 54.4 kg 54.4 kg    Examination:  General exam: Appears calm and comfortable, deconditioned, not in any acute distress. Respiratory system: Clear to auscultation. Respiratory effort normal. RR15 Cardiovascular system: S1 & S2 heard, RRR.  No JVD, murmurs, rubs, gallops or clicks. Gastrointestinal system: Abdomen is non distended, soft and non tender.  Normal bowel sounds heard. Central nervous system: Alert and oriented X3 . No focal neurological deficits. Extremities: Status post right hip reduction POD #2  Skin: No rashes, lesions or ulcers Psychiatry: Judgement and insight appear normal. Mood & affect appropriate.     Data Reviewed: I have personally reviewed following labs and imaging studies  CBC: Recent Labs  Lab 02/10/23 0002 02/11/23 0403  WBC 11.1* 11.8*  HGB 11.3* 11.3*  HCT 34.5* 35.1*  MCV 90.8 94.1  PLT 193 185   Basic Metabolic Panel: Recent Labs  Lab 02/10/23 0002 02/11/23 0403  NA 142 132*  K 3.8 4.0  CL 108 100  CO2 28 24  GLUCOSE 134* 135*  BUN 17 16  CREATININE 0.81 0.69  CALCIUM  8.7* 8.6*   GFR: Estimated Creatinine Clearance: 40.7 mL/min (by C-G formula based on SCr of 0.69 mg/dL). Liver Function Tests: No results for input(s): AST, ALT, ALKPHOS, BILITOT, PROT, ALBUMIN in the last 168 hours. No results for input(s): LIPASE, AMYLASE in the last 168 hours. No results for input(s): AMMONIA in the last 168 hours. Coagulation Profile: No results for input(s): INR, PROTIME in the last 168 hours. Cardiac Enzymes: No results for input(s): CKTOTAL, CKMB, CKMBINDEX, TROPONINI in the last 168 hours. BNP (last 3 results) No results for input(s): PROBNP in the last 8760 hours. HbA1C: No results for input(s): HGBA1C in the last 72 hours. CBG: No results for input(s): GLUCAP in the last 168 hours. Lipid Profile: No results for input(s): CHOL, HDL, LDLCALC, TRIG, CHOLHDL, LDLDIRECT in the last 72 hours. Thyroid  Function Tests: No results for input(s): TSH, T4TOTAL, FREET4, T3FREE, THYROIDAB in the last 72 hours. Anemia Panel: No results for input(s): VITAMINB12, FOLATE, FERRITIN, TIBC, IRON, RETICCTPCT in the last 72  hours. Sepsis Labs: No results for input(s): PROCALCITON, LATICACIDVEN in the last 168 hours.  No results found for this or any previous visit (from the past 240 hours).   Radiology Studies: DG Pelvis Portable Result Date: 02/10/2023 CLINICAL DATA:  Status post right total hip arthroplasty revision EXAM: PORTABLE PELVIS 1-2 VIEWS COMPARISON:  02/09/2023 for recurrent instability FINDINGS: Right hip prosthesis noted with new constrained liner in place. No periprosthetic fracture or acute complicating feature. The patient also has an existing left hip prosthesis. IMPRESSION: 1. Right hip prosthesis with new constrained liner in place. No acute complicating feature. Electronically Signed   By: Ryan Salvage M.D.   On: 02/10/2023 19:56    Scheduled Meds:  acetaminophen   650 mg Oral Q6H   amLODipine   5 mg Oral Daily   aspirin   81 mg Oral BID   fluticasone  furoate-vilanterol  1 puff Inhalation Daily   And   umeclidinium bromide   1 puff Inhalation Daily   levothyroxine   100 mcg Oral Q0600   melatonin  5 mg Oral Q24H   pantoprazole   40 mg Oral BID   polyethylene glycol  17 g Oral BID   senna  2 tablet Oral QHS   sodium chloride  flush  3-10 mL Intravenous Q12H   venlafaxine   50 mg Oral BID   Continuous Infusions:   LOS: 2 days    Time spent: 35 mins    Darcel Dawley, MD [facility]   If 7PM-7AM, please contact night-coverage

## 2023-02-12 NOTE — TOC Progression Note (Signed)
 Transition of Care [facility]) - Progression Note   Patient Details  Name: Priscilla Cantrell MRN: [id number] Date of Birth: [birth year]  Transition [facility]) CM/SW Contact  Duwaine GORMAN Aran, LCSW Phone Number: 02/12/2023, 1:11 PM  Clinical Narrative: CSW provided patient with list of bed offers:  [facility] [address] [place], [place] [address] [phone] Overall rating ??? Average  [facility] [address] [place], [place] [address] [phone] Overall rating ????? Much above average  [facility] [address] [place], [place] [address] [phone] Overall rating ???? Above average  [facility] [address] [place], [place] [address] [phone] Overall rating ??? Average  Patient requested to follow up with her significant other regarding the offers, but is considering [facility]. CSW followed up with patient later in the day and patient reported she is still undecided about SNF as she is considering going home with HHPT.  Expected Discharge Plan: Skilled Nursing Facility Barriers to Discharge: Continued Medical Work up  Expected Discharge Plan and Services In-house Referral: Clinical Social Work Post Acute Care Choice: Skilled Nursing Facility Living arrangements for the past 2 months: Single Family Home  Social Determinants of Health (SDOH) Interventions SDOH Screenings   Food Insecurity: No Food Insecurity (02/10/2023)  Housing: Low Risk  (02/10/2023)  Transportation Needs: No Transportation Needs (02/10/2023)  Utilities: Not At Risk (02/10/2023)  Alcohol  Screen: Low Risk  (02/01/2020)  Depression (PHQ2-9): Low Risk  (11/12/2022)  Financial Resource Strain: Low Risk  (03/19/2022)  Physical Activity: Sufficiently Active (03/19/2022)  Social Connections: Socially Integrated (02/10/2023)  Stress: No Stress Concern Present (03/19/2022)  Tobacco Use: Medium Risk (02/10/2023)   Readmission Risk  Interventions     No data to display

## 2023-02-13 DIAGNOSIS — S73004A Unspecified dislocation of right hip, initial encounter: Secondary | ICD-10-CM | POA: Diagnosis not present

## 2023-02-13 NOTE — Progress Notes (Addendum)
 Physical Therapy Treatment Patient Details Name: Priscilla Cantrell MRN: [id number] DOB: 1935-05-22 Today's Date: 02/13/2023   History of Present Illness 88 yo female admitted with hip dislocation. S/P R THA rev + constrained liner 02/10/23. Hx of R THA with multiple procedures/surgeries 2* dislocation, RSV, COVID, COPD-O2 dep 2L, LE edema, flu, BPPV, Cdiff, peripheral neuralgia, RLS, osteoporosis, shingles, spinal stenosis, L THA 2019, falls    PT Comments  Pt seated on edge of recliner with BLE dangling to side of elevated foot rest and near 90 deg angle mark upon therapist's arrival to room. Heavy education regarding posterior hip precautions throughout treatment session with poor carryover, pt needing frequent reminders with transfers from recliner and toilet also while ambulating with RW. Pt with improvement in gait distance, facial wincing noted but pain reports improve with rest breaks. Therapist soley managing O2 tank throughout all mobility due to pt's need for RW for steadying support. Pt tolerates remaining up in recliner at EOS with RN in room administering medication. Provided written/illustrated posterior hip precautions handout into pt's hand to improve awareness while seated in recliner and with repositioning in recliner. Pt reports support person, [name], is at home recovering from hernia surgery and states isn't going well; cont to recommend inpatient follow up therapy, <3 hours/day due to lack of support at home.    If plan is discharge home, recommend the following: A little help with walking and/or transfers;A little help with bathing/dressing/bathroom;Assistance with cooking/housework;Assist for transportation;Help with stairs or ramp for entrance   Can travel by private vehicle     Yes  Equipment Recommendations  Rolling walker (2 wheels);BSC/3in1    Recommendations for Other Services       Precautions / Restrictions Precautions Precautions: Fall;Posterior Hip Precaution  Comments: needs posterior hip precaution reinforcement with mobility Restrictions Weight Bearing Restrictions Per Provider Order: Yes RLE Weight Bearing Per Provider Order: Weight bearing as tolerated     Mobility  Bed Mobility               General bed mobility comments: in recliner upon arrival    Transfers Overall transfer level: Needs assistance Equipment used: Rolling walker (2 wheels) Transfers: Sit to/from Stand Sit to Stand: Contact guard assist           General transfer comment: verbal cuse for avoiding forward trunk lean and breaking 90 degree angel when powering up and returning to sitting, also cued for widening BOS to avoid hip adduction with transfers    Ambulation/Gait Ambulation/Gait assistance: Contact guard assist Gait Distance (Feet): 100 Feet (x2 with rest break) Assistive device: Rolling walker (2 wheels) Gait Pattern/deviations: Step-to pattern, Step-through pattern, Decreased stride length Gait velocity: decreased     General Gait Details: pt with improvement in gait tolerance, some facial wincing noting discomfort, pt needing rest break due to fatigue and discomfort, verbal cues for LE positioning with turns to avoid internal rotation or adduction of BLE to respect posterior hip precautions, pt needing intermittent cues demo poor carryover during mobility   Stairs             Wheelchair Mobility     Tilt Bed    Modified Rankin (Stroke Patients Only)       Balance Overall balance assessment: Needs assistance, History of Falls         Standing balance support: During functional activity, Reliant on assistive device for balance, Bilateral upper extremity supported Standing balance-Leahy Scale: Poor  Cognition Arousal: Alert Behavior During Therapy: WFL for tasks assessed/performed Overall Cognitive Status: Within Functional Limits for tasks assessed                                  General Comments: Pt recalls 1/3 hip precautions I can't cross my legs down there or up here; maximum education with visual demonstration and written/illustrated HEP, however pt unable to recall throughout session.        Exercises      General Comments        Pertinent Vitals/Pain Pain Assessment Pain Assessment: Faces Faces Pain Scale: Hurts a little bit Pain Location: R hip with activity Pain Descriptors / Indicators: Discomfort, Grimacing Pain Intervention(s): Limited activity within patient's tolerance, Monitored during session    Home Living                          Prior Function            PT Goals (current goals can now be found in the care plan section) Progress towards PT goals: Progressing toward goals    Frequency    Min 1X/week      PT Plan      Co-evaluation              AM-PAC PT 6 Clicks Mobility   Outcome Measure  Help needed turning from your back to your side while in a flat bed without using bedrails?: A Little Help needed moving from lying on your back to sitting on the side of a flat bed without using bedrails?: A Little Help needed moving to and from a bed to a chair (including a wheelchair)?: A Little Help needed standing up from a chair using your arms (e.g., wheelchair or bedside chair)?: A Little Help needed to walk in hospital room?: A Little Help needed climbing 3-5 steps with a railing? : Total 6 Click Score: 16    End of Session Equipment Utilized During Treatment: Gait belt Activity Tolerance: Patient tolerated treatment well Patient left: in chair;with chair alarm set;with call bell/phone within reach;with nursing/sitter in room Nurse Communication: Mobility status PT Visit Diagnosis: History of falling (Z91.81);Other abnormalities of gait and mobility (R26.89) Pain - Right/Left: Right Pain - part of body: Hip     Time: 1036-1100 PT Time Calculation (min) (ACUTE ONLY): 24  min  Charges:    $Gait Training: 8-22 mins $Therapeutic Activity: 8-22 mins PT General Charges $$ ACUTE PT VISIT: 1 Visit                     Tori Lorma Heater PT, DPT 02/13/23, 11:11 AM

## 2023-02-13 NOTE — Plan of Care (Signed)

## 2023-02-13 NOTE — TOC Progression Note (Signed)
 Transition of Care [facility]) - Progression Note   Patient Details  Name: Priscilla Cantrell MRN: [id number] Date of Birth: [birth year]  Transition [facility] Care [facility]) CM/SW Contact  Duwaine GORMAN Aran, LCSW Phone Number: 02/13/2023, 3:02 PM  Clinical Narrative: CSW spoke with patient several times today to get patient's choice on SNF vs. HH. Patient continues to be indecisive. CSW explained patient is approaching being medically ready for discharge and needs to make a decision for which option she would like to proceed with at discharge. Hospitalist aware.    Expected Discharge Plan: Skilled Nursing Facility Barriers to Discharge: Continued Medical Work up  Expected Discharge Plan and Services In-house Referral: Clinical Social Work Post Acute Care Choice: Skilled Nursing Facility Living arrangements for the past 2 months: Single Family Home  Social Determinants of Health (SDOH) Interventions SDOH Screenings   Food Insecurity: No Food Insecurity (02/10/2023)  Housing: Low Risk  (02/10/2023)  Transportation Needs: No Transportation Needs (02/10/2023)  Utilities: Not At Risk (02/10/2023)  Alcohol  Screen: Low Risk  (02/01/2020)  Depression (PHQ2-9): Low Risk  (11/12/2022)  Financial Resource Strain: Low Risk  (03/19/2022)  Physical Activity: Sufficiently Active (03/19/2022)  Social Connections: Socially Integrated (02/10/2023)  Stress: No Stress Concern Present (03/19/2022)  Tobacco Use: Medium Risk (02/10/2023)   Readmission Risk Interventions     No data to display

## 2023-02-13 NOTE — Progress Notes (Signed)
 PROGRESS NOTE    CIERRIA HEIGHT  FMW:[id number] DOB: 12/16/1935 DOA: 02/09/2023 PCP: Candise Aleene DEL, MD   Brief Narrative:  This 88 yrs old female with PMH  significant for chronic hypoxic respiratory failure secondary to COPD on 2 L of supplemental oxygen  at baseline, hypertension, hypothyroidism, dementia, anxiety, and recurrent hip dislocation.  Patient presented status post recurrent hip dislocation.  She stated while picking something from the ground She has heard the pop, she has experienced multiple dislocations in the past and She felt the pain seems similar to the past and she is found to have superior dislocation of the right hip replacement.  Patient was evaluated by orthopedics and recommended to have close reduction or revision of the hip replacement in the OR.   Assessment & Plan:   Principal Problem:   Hip dislocation, right, initial encounter (HCC)   Right hip dislocation; - Ortho consulted by EDP.  Appreciate Orthopedics consultation and management - Adequate Pain control  - Medically optimized for surgery; Recent Echo LVEF 06/2021 60-65%. - She is on 2L O2 at baseline and SPO2 currently 95% on 3L.  - Status Post closed reduction,  right total hip revision / POD 3 - Continue aspirin  81 mg twice daily for 4 weeks. - Weightbearing as tolerated. - PT/OT evaluation post-op  Chronic hypoxic respiratory failure: COPD: On 2L O2 via nasal cannula at baseline - Continue home O2 and Trelegy Ellipta . - PRN nebulizers while inpatient.   Essential Hypertension: - Continue home amlodipine .   Hypothyroidism: - Continue home Synthroid    Dementia: Generalized Anxiety Disorder: Per chart review does fine during the day but has significant sundowing at night - Trial 5 mg Melatonin at 1800 - Continue home Venlaxafine and PRN Xanax    DVT prophylaxis: SCDs Code Status: Full code Family Communication: No family at bedside Disposition Plan:  Status is: Inpatient Remains  inpatient appropriate because: Admitted for recurrent right hip dislocation.  Patient underwent closed reduction/ revision in the OR.  POD #3 Follow TOC for disposition plan, patient is not able to make a decision to go home versus SNF? Medically stable to discharge.  Consultants:  Orthopaedics  Procedures: closed reduction/ revision in the OR Antimicrobials:  Anti-infectives (From admission, onward)    Start     Dose/Rate Route Frequency Ordered Stop   02/11/23 0600  ceFAZolin  (ANCEF ) IVPB 2g/100 mL premix  Status:  Discontinued        2 g 200 mL/hr over 30 Minutes Intravenous On call to O.R. 02/10/23 1212 02/10/23 1224   02/10/23 2000  vancomycin  (VANCOCIN ) IVPB 1000 mg/200 mL premix        1,000 mg 200 mL/hr over 60 Minutes Intravenous Every 12 hours 02/10/23 1722 02/10/23 2227   02/10/23 1430  vancomycin  (VANCOCIN ) IVPB 1000 mg/200 mL premix  Status:  Discontinued        1,000 mg 200 mL/hr over 60 Minutes Intravenous To ShortStay Surgical 02/10/23 1225 02/10/23 1722   02/10/23 1430  levofloxacin  (LEVAQUIN ) IVPB 750 mg  Status:  Discontinued        750 mg 100 mL/hr over 90 Minutes Intravenous  Once 02/10/23 1225 02/10/23 1722      Subjective: No significant events overnight, patient was sitting audibly in the recliner. Pain is under control, no pain at rest. As per [facility] patient is not able to make a final decision to go to SNF versus home?  Objective: Vitals:   02/13/23 0353 02/13/23 0759 02/13/23 0802 02/13/23 1036  BP: ROLLEN)  122/57   115/83  Pulse: 64     Resp: 19     Temp: 98.1 F (36.7 C)     TempSrc: Oral     SpO2: 100% 99% 99%   Weight:      Height:        Intake/Output Summary (Last 24 hours) at 02/13/2023 1447 Last data filed at 02/13/2023 1300 Gross per 24 hour  Intake 240 ml  Output --  Net 240 ml   Filed Weights   02/09/23 2100 02/10/23 0156  Weight: 54.4 kg 54.4 kg    Examination:  General exam: Appears calm and comfortable, deconditioned, not in  any acute distress. Respiratory system: Clear to auscultation. Respiratory effort normal. RR15 Cardiovascular system: S1 & S2 heard, RRR. No JVD, murmurs, rubs, gallops or clicks. Gastrointestinal system: Abdomen is non distended, soft and non tender.  Normal bowel sounds heard. Central nervous system: Alert and oriented X3 . No focal neurological deficits. Extremities: Status post right hip reduction POD #3 Skin: No rashes, lesions or ulcers Psychiatry: Judgement and insight appear normal. Mood & affect appropriate.     Data Reviewed: I have personally reviewed following labs and imaging studies  CBC: Recent Labs  Lab 02/10/23 0002 02/11/23 0403  WBC 11.1* 11.8*  HGB 11.3* 11.3*  HCT 34.5* 35.1*  MCV 90.8 94.1  PLT 193 185   Basic Metabolic Panel: Recent Labs  Lab 02/10/23 0002 02/11/23 0403  NA 142 132*  K 3.8 4.0  CL 108 100  CO2 28 24  GLUCOSE 134* 135*  BUN 17 16  CREATININE 0.81 0.69  CALCIUM  8.7* 8.6*   GFR: Estimated Creatinine Clearance: 40.7 mL/min (by C-G formula based on SCr of 0.69 mg/dL). Liver Function Tests: No results for input(s): AST, ALT, ALKPHOS, BILITOT, PROT, ALBUMIN in the last 168 hours. No results for input(s): LIPASE, AMYLASE in the last 168 hours. No results for input(s): AMMONIA in the last 168 hours. Coagulation Profile: No results for input(s): INR, PROTIME in the last 168 hours. Cardiac Enzymes: No results for input(s): CKTOTAL, CKMB, CKMBINDEX, TROPONINI in the last 168 hours. BNP (last 3 results) No results for input(s): PROBNP in the last 8760 hours. HbA1C: No results for input(s): HGBA1C in the last 72 hours. CBG: No results for input(s): GLUCAP in the last 168 hours. Lipid Profile: No results for input(s): CHOL, HDL, LDLCALC, TRIG, CHOLHDL, LDLDIRECT in the last 72 hours. Thyroid  Function Tests: No results for input(s): TSH, T4TOTAL, FREET4, T3FREE, THYROIDAB in the  last 72 hours. Anemia Panel: No results for input(s): VITAMINB12, FOLATE, FERRITIN, TIBC, IRON, RETICCTPCT in the last 72 hours. Sepsis Labs: No results for input(s): PROCALCITON, LATICACIDVEN in the last 168 hours.  No results found for this or any previous visit (from the past 240 hours).   Radiology Studies: No results found.   Scheduled Meds:  acetaminophen   650 mg Oral Q6H   amLODipine   5 mg Oral Daily   aspirin   81 mg Oral BID   fluticasone  furoate-vilanterol  1 puff Inhalation Daily   And   umeclidinium bromide   1 puff Inhalation Daily   levothyroxine   100 mcg Oral Q0600   melatonin  5 mg Oral Q24H   pantoprazole   40 mg Oral BID   polyethylene glycol  17 g Oral BID   senna  2 tablet Oral QHS   sodium chloride  flush  3-10 mL Intravenous Q12H   venlafaxine   50 mg Oral BID   Continuous Infusions:   LOS:  3 days    Time spent: 35 mins    Elvan Sor, MD [facility]   If 7PM-7AM, please contact night-coverage

## 2023-02-14 DIAGNOSIS — S73004A Unspecified dislocation of right hip, initial encounter: Secondary | ICD-10-CM | POA: Diagnosis not present

## 2023-02-14 LAB — CBC
HCT: 35.9 % — ABNORMAL LOW (ref 36.0–46.0)
Hemoglobin: 11.3 g/dL — ABNORMAL LOW (ref 12.0–15.0)
MCH: 29.7 pg (ref 26.0–34.0)
MCHC: 31.5 g/dL (ref 30.0–36.0)
MCV: 94.2 fL (ref 80.0–100.0)
Platelets: 249 10*3/uL (ref 150–400)
RBC: 3.81 MIL/uL — ABNORMAL LOW (ref 3.87–5.11)
RDW: 12.8 % (ref 11.5–15.5)
WBC: 6.4 10*3/uL (ref 4.0–10.5)
nRBC: 0 % (ref 0.0–0.2)

## 2023-02-14 LAB — BASIC METABOLIC PANEL
Anion gap: 9 (ref 5–15)
BUN: 21 mg/dL (ref 8–23)
CO2: 28 mmol/L (ref 22–32)
Calcium: 9.1 mg/dL (ref 8.9–10.3)
Chloride: 104 mmol/L (ref 98–111)
Creatinine, Ser: 0.65 mg/dL (ref 0.44–1.00)
GFR, Estimated: >60 mL/min (ref >60)
Glucose, Bld: 106 mg/dL — ABNORMAL HIGH (ref 70–99)
Potassium: 3.7 mmol/L (ref 3.5–5.1)
Sodium: 141 mmol/L (ref 135–145)

## 2023-02-14 LAB — PHOSPHORUS: Phosphorus: 2.9 mg/dL (ref 2.5–4.6)

## 2023-02-14 LAB — MAGNESIUM: Magnesium: 2 mg/dL (ref 1.7–2.4)

## 2023-02-14 MED ORDER — SENNOSIDES-DOCUSATE SODIUM 8.6-50 MG PO TABS: 2.0000 | ORAL_TABLET | Freq: Two times a day (BID) | ORAL | Status: DC | PRN

## 2023-02-14 MED ORDER — ACETAMINOPHEN 325 MG PO TABS: 650.0000 mg | ORAL_TABLET | Freq: Four times a day (QID) | ORAL | Status: AC

## 2023-02-14 MED ORDER — ASPIRIN 81 MG PO CHEW: 81.0000 mg | CHEWABLE_TABLET | Freq: Two times a day (BID) | ORAL | Status: DC

## 2023-02-14 MED ORDER — POLYETHYLENE GLYCOL 3350 17 G PO PACK: 17.0000 g | PACK | Freq: Every day | ORAL | Status: DC | PRN

## 2023-02-14 NOTE — Plan of Care (Addendum)
 Patient ambulated in hallway on room air, 1 person assist with front wheel walker. At about 100' patient dropped from 92% saturation to 86%, paused, worked on deep breathing, continued walking when above 90%. Walked total of 250' was 90-92% on pulse oximeter (drops below when talking and ambulating) back to chair, left patient at 1L [place]. Provider notified. Hip precautions maintained.   Problem: Education: Goal: Knowledge of General Education information will improve Description: Including pain rating scale, medication(s)/side effects and non-pharmacologic comfort measures Outcome: Progressing   Problem: Health Behavior/Discharge Planning: Goal: Ability to manage health-related needs will improve Outcome: Progressing   Problem: Clinical Measurements: Goal: Ability to maintain clinical measurements within normal limits will improve Outcome: Progressing Goal: Will remain free from infection Outcome: Progressing Goal: Diagnostic test results will improve Outcome: Progressing Goal: Respiratory complications will improve Outcome: Progressing Goal: Cardiovascular complication will be avoided Outcome: Progressing   Problem: Activity: Goal: Risk for activity intolerance will decrease Outcome: Progressing   Problem: Nutrition: Goal: Adequate nutrition will be maintained Outcome: Progressing   Problem: Coping: Goal: Level of anxiety will decrease Outcome: Progressing   Problem: Elimination: Goal: Will not experience complications related to bowel motility Outcome: Progressing Goal: Will not experience complications related to urinary retention Outcome: Progressing   Problem: Pain Management: Goal: General experience of comfort will improve Outcome: Progressing   Problem: Safety: Goal: Ability to remain free from injury will improve Outcome: Progressing   Problem: Skin Integrity: Goal: Risk for impaired skin integrity will decrease Outcome: Progressing   Problem:  Education: Goal: Knowledge of the prescribed therapeutic regimen will improve Outcome: Progressing Goal: Understanding of discharge needs will improve Outcome: Progressing Goal: Individualized Educational Video(s) Outcome: Progressing   Problem: Activity: Goal: Ability to avoid complications of mobility impairment will improve Outcome: Progressing Goal: Ability to tolerate increased activity will improve Outcome: Progressing   Problem: Clinical Measurements: Goal: Postoperative complications will be avoided or minimized Outcome: Progressing   Problem: Pain Management: Goal: Pain level will decrease with appropriate interventions Outcome: Progressing   Problem: Skin Integrity: Goal: Will show signs of wound healing Outcome: Progressing

## 2023-02-14 NOTE — Progress Notes (Signed)
 PROGRESS NOTE  Priscilla Cantrell FMW:[id number] DOB: February 26, 1935   PCP: Candise Aleene DEL, MD  Patient is from: Home.  Lives with significant other.  DOA: 02/09/2023 LOS: 4  Chief complaints Chief Complaint  Patient presents with   Hip Pain     Brief Narrative / Interim history: 88 year old F with PMH of COPD, chronic hypoxic RF on 2 L, HTN, hypothyroidism, mild dementia, anxiety and recurrent hip dislocation [facility] due to concern for hip dislocation.  Reportedly, she dropped something on the ground and reached down to get it when she twisted and felt pain and hip popping.  She had experienced multiple hip dislocation in the past. [facility] attempted reduction under moderation sedation however patient became hypoxic and reduction aborted.  Orthopedic surgery consulted and she was admitted to [facility] for surgical management.  Patient underwent right total hip revision by Dr. Ernie on 1/6.  Therapy recommended SNF.  Medically stable for discharge.   Subjective: Seen and examined earlier this morning.  No major events overnight of this morning.  No complaints.  Pain fairly controlled.  She did not require pain medications.  Objective: Vitals:   02/13/23 1646 02/13/23 2025 02/14/23 0442 02/14/23 0849  BP: (!) 146/62 138/67 (!) 167/76   Pulse: 67 61 (!) 59   Resp: 20 19 16    Temp: 98.3 F (36.8 C) 98.3 F (36.8 C) 98.2 F (36.8 C)   TempSrc: Oral Oral Oral   SpO2: 100% 99% 100% 93%  Weight:      Height:        Examination:  GENERAL: No apparent distress.  Nontoxic. HEENT: MMM.  Vision and hearing grossly intact.  NECK: Supple.  No apparent JVD.  RESP:  No IWOB.  Fair aeration bilaterally. CVS:  RRR. Heart sounds normal.  ABD/GI/GU: BS+. Abd soft, NTND.  MSK/EXT:  Moves extremities. No apparent deformity. No edema.  SKIN: no apparent skin lesion or wound NEURO: Awake, alert and oriented appropriately.  No apparent focal neuro deficit. PSYCH: Calm.  Normal affect.   Procedures:  1/6-right total hip revision by Dr. Ernie  Microbiology summarized: None  Assessment and plan: Recurrent right hip dislocation: -[facility] attempted closed reduction under moderation sedation which was aborted due to hypoxemia -S/p right total hip revision by Dr. Ernie on 1/6.  -Pain control.  Did not require pain medications.   -Ortho recommended aspirin  81 mg twice daily for 4 weeks postoperatively - Weightbearing as tolerated. - PT/OT recommended SNF.   Chronic COPD: Stable Chronic hypoxic respiratory failure: On 2 L at baseline.  Stable -Will attempt weaning off oxygen  completely and ambulate. -Continue Trelegy Ellipta  and as needed nebulizers   Essential Hypertension: Normotensive for most part.  On low-dose amlodipine . -Continue home amlodipine .   Hypothyroidism: - Continue home Synthroid    Dementia without behavioral disturbance: Awake and alert and fairly oriented. Generalized Anxiety Disorder/insomnia: Seems to take Xanax  as needed at home but did not require here. -Continue home Effexor  -Melatonin at night   Body mass index is 21.95 kg/m.         DVT prophylaxis:  SCDs Start: 02/10/23 1722 Place TED hose Start: 02/10/23 1722 SCDs Start: 02/10/23 0840  Code Status: Full code Family Communication: None at bedside Level of care: Med-Surg Status is: Inpatient Remains inpatient appropriate because: Short-term rehab postoperatively  Final disposition: SNF Consultants:  Orthopedic surgery  35 minutes with more than 50% spent in reviewing records, counseling patient/family and coordinating care.   Sch  Meds:  Scheduled Meds:  acetaminophen   650 mg Oral Q6H   amLODipine   5 mg Oral Daily   aspirin   81 mg Oral BID   fluticasone  furoate-vilanterol  1 puff Inhalation Daily   And   umeclidinium bromide   1 puff Inhalation Daily   levothyroxine   100 mcg Oral Q0600   melatonin  5 mg Oral Q24H   pantoprazole   40 mg Oral BID    polyethylene glycol  17 g Oral BID   senna  2 tablet Oral QHS   sodium chloride  flush  3-10 mL Intravenous Q12H   venlafaxine   50 mg Oral BID   Continuous Infusions: PRN Meds:.alum & mag hydroxide-simeth, bisacodyl , diphenhydrAMINE , HYDROmorphone  (DILAUDID ) injection, menthol -cetylpyridinium **OR** phenol, methocarbamol  **OR** methocarbamol  (ROBAXIN ) injection, metoCLOPramide  **OR** metoCLOPramide  (REGLAN ) injection, ondansetron  **OR** ondansetron  (ZOFRAN ) IV, oxyCODONE , oxyCODONE , sodium chloride  flush  Antimicrobials: Anti-infectives (From admission, onward)    Start     Dose/Rate Route Frequency Ordered Stop   02/11/23 0600  ceFAZolin  (ANCEF ) IVPB 2g/100 mL premix  Status:  Discontinued        2 g 200 mL/hr over 30 Minutes Intravenous On call to O.R. 02/10/23 1212 02/10/23 1224   02/10/23 2000  vancomycin  (VANCOCIN ) IVPB 1000 mg/200 mL premix        1,000 mg 200 mL/hr over 60 Minutes Intravenous Every 12 hours 02/10/23 1722 02/10/23 2227   02/10/23 1430  vancomycin  (VANCOCIN ) IVPB 1000 mg/200 mL premix  Status:  Discontinued        1,000 mg 200 mL/hr over 60 Minutes Intravenous To ShortStay Surgical 02/10/23 1225 02/10/23 1722   02/10/23 1430  levofloxacin  (LEVAQUIN ) IVPB 750 mg  Status:  Discontinued        750 mg 100 mL/hr over 90 Minutes Intravenous  Once 02/10/23 1225 02/10/23 1722        I have personally reviewed the following labs and images: CBC: Recent Labs  Lab 02/10/23 0002 02/11/23 0403 02/14/23 0521  WBC 11.1* 11.8* 6.4  HGB 11.3* 11.3* 11.3*  HCT 34.5* 35.1* 35.9*  MCV 90.8 94.1 94.2  PLT 193 185 249   BMP &GFR Recent Labs  Lab 02/10/23 0002 02/11/23 0403 02/14/23 0521  NA 142 132* 141  K 3.8 4.0 3.7  CL 108 100 104  CO2 28 24 28   GLUCOSE 134* 135* 106*  BUN 17 16 21   CREATININE 0.81 0.69 0.65  CALCIUM  8.7* 8.6* 9.1  MG  --   --  2.0  PHOS  --   --  2.9   Estimated Creatinine Clearance: 40.7 mL/min (by C-G formula based on SCr of 0.65  mg/dL). Liver & Pancreas: No results for input(s): AST, ALT, ALKPHOS, BILITOT, PROT, ALBUMIN in the last 168 hours. No results for input(s): LIPASE, AMYLASE in the last 168 hours. No results for input(s): AMMONIA in the last 168 hours. Diabetic: No results for input(s): HGBA1C in the last 72 hours. No results for input(s): GLUCAP in the last 168 hours. Cardiac Enzymes: No results for input(s): CKTOTAL, CKMB, CKMBINDEX, TROPONINI in the last 168 hours. No results for input(s): PROBNP in the last 8760 hours. Coagulation Profile: No results for input(s): INR, PROTIME in the last 168 hours. Thyroid  Function Tests: No results for input(s): TSH, T4TOTAL, FREET4, T3FREE, THYROIDAB in the last 72 hours. Lipid Profile: No results for input(s): CHOL, HDL, LDLCALC, TRIG, CHOLHDL, LDLDIRECT in the last 72 hours. Anemia Panel: No results for input(s): VITAMINB12, FOLATE, FERRITIN, TIBC, IRON, RETICCTPCT in the last 72 hours. Urine  analysis:    Component Value Date/Time   COLORURINE YELLOW 10/29/2021 1938   APPEARANCEUR CLEAR 10/29/2021 1938   APPEARANCEUR Clear 11/23/2020 1121   LABSPEC >=1.030 10/29/2021 1938   PHURINE 5.5 10/29/2021 1938   GLUCOSEU NEGATIVE 10/29/2021 1938   GLUCOSEU NEGATIVE 06/14/2015 1452   HGBUR SMALL (A) 10/29/2021 1938   BILIRUBINUR Negative 09/11/2022 1404   BILIRUBINUR Negative 11/23/2020 1121   KETONESUR NEGATIVE 10/29/2021 1938   PROTEINUR Positive (A) 09/11/2022 1404   PROTEINUR NEGATIVE 10/29/2021 1938   UROBILINOGEN 1.0 09/11/2022 1404   UROBILINOGEN 0.2 06/14/2015 1452   NITRITE Negative 09/11/2022 1404   NITRITE NEGATIVE 10/29/2021 1938   LEUKOCYTESUR Large (3+) (A) 09/11/2022 1404   LEUKOCYTESUR SMALL (A) 10/29/2021 1938   Sepsis Labs: Invalid input(s): PROCALCITONIN, LACTICIDVEN  Microbiology: No results found for this or any previous visit (from the past 240  hours).  Radiology Studies: No results found.    Jamelle Goldston T. Adham Johnson [facility]  If 7PM-7AM, please contact night-coverage [email or web address] 02/14/2023, 11:23 AM

## 2023-02-14 NOTE — Plan of Care (Signed)

## 2023-02-15 DIAGNOSIS — S73004A Unspecified dislocation of right hip, initial encounter: Secondary | ICD-10-CM | POA: Diagnosis not present

## 2023-02-15 NOTE — Progress Notes (Signed)
 PROGRESS NOTE  Priscilla Cantrell FMW:[id number] DOB: 1935-04-15   PCP: Candise Aleene DEL, MD  Patient is from: Home.  Lives with significant other.  DOA: 02/09/2023 LOS: 5  Chief complaints Chief Complaint  Patient presents with   Hip Pain     Brief Narrative / Interim history: 88 year old F with PMH of COPD, chronic hypoxic RF on 2 L, HTN, hypothyroidism, mild dementia, anxiety and recurrent hip dislocation [facility] due to concern for hip dislocation.  Reportedly, she dropped something on the ground and reached down to get it when she twisted and felt pain and hip popping.  She had experienced multiple hip dislocation in the past. [facility] attempted reduction under moderation sedation however patient became hypoxic and reduction aborted.  Orthopedic surgery consulted and she was admitted to [facility] for surgical management.  Patient underwent right total hip revision by Dr. Ernie on 1/6.  Therapy recommended SNF.  Medically stable for discharge.   Subjective: Seen and examined earlier this morning.  No major events overnight of this morning.  No complaints but overwhelmed about SNF.  It seems she was provided with list of SNF but has not picked one yet.  She requests me to talk to her significant other, James  Objective: Vitals:   02/15/23 0633 02/15/23 0926 02/15/23 1124 02/15/23 1250  BP: (!) 155/66   133/71  Pulse: 68   73  Resp: 19   20  Temp: 97.8 F (36.6 C)   98.7 F (37.1 C)  TempSrc: Oral   Oral  SpO2: 95% 91% 96% 100%  Weight:      Height:        Examination:  GENERAL: No apparent distress.  Nontoxic. HEENT: MMM.  Vision and hearing grossly intact.  NECK: Supple.  No apparent JVD.  RESP:  No IWOB.  Fair aeration bilaterally. CVS:  RRR. Heart sounds normal.  ABD/GI/GU: BS+. Abd soft, NTND.  MSK/EXT:  Moves extremities. No apparent deformity. No edema.  SKIN: no apparent skin lesion or wound NEURO: Awake, alert and oriented  appropriately.  No apparent focal neuro deficit. PSYCH: Calm. Normal affect.   Procedures:  1/6-right total hip revision by Dr. Ernie  Microbiology summarized: None  Assessment and plan: Recurrent right hip dislocation: -[facility] attempted closed reduction under moderation sedation which was aborted due to hypoxemia -S/p right total hip revision by Dr. Ernie on 1/6.  -Pain control.  Did not require pain medications.   -Ortho recommended aspirin  81 mg twice daily for 4 weeks postoperatively -Weightbearing as tolerated. -PT/OT recommended SNF.  TOC working on this.   Chronic COPD: Stable Chronic hypoxic respiratory failure: On 2 L at baseline.  Desaturated to 86% when ambulated on room air on 1/11. -Continue Trelegy Ellipta  and as needed nebulizers -Continue home oxygen    Essential Hypertension: Normotensive for most part.  On low-dose amlodipine . -Continue home amlodipine .   Hypothyroidism: - Continue home Synthroid    Dementia without behavioral disturbance: Awake and alert and fairly oriented. Generalized Anxiety Disorder/insomnia: Seems to take Xanax  as needed at home but did not require here. -Continue home Effexor  -Melatonin at night   Body mass index is 21.95 kg/m.         DVT prophylaxis:  SCDs Start: 02/10/23 1722 Place TED hose Start: 02/10/23 1722 SCDs Start: 02/10/23 0840  Code Status: Full code Family Communication: Updated patient's significant other, [name] over the phone at patient's request. Level of care: Med-Surg Status is: Inpatient Remains inpatient appropriate because:  Short-term rehab postoperatively  Final disposition: SNF Consultants:  Orthopedic surgery  35 minutes with more than 50% spent in reviewing records, counseling patient/family and coordinating care.   Sch Meds:  Scheduled Meds:  amLODipine   5 mg Oral Daily   aspirin   81 mg Oral BID   fluticasone  furoate-vilanterol  1 puff Inhalation Daily   And   umeclidinium bromide    1 puff Inhalation Daily   levothyroxine   100 mcg Oral Q0600   melatonin  5 mg Oral Q24H   pantoprazole   40 mg Oral BID   polyethylene glycol  17 g Oral BID   senna  2 tablet Oral QHS   sodium chloride  flush  3-10 mL Intravenous Q12H   venlafaxine   50 mg Oral BID   Continuous Infusions: PRN Meds:.alum & mag hydroxide-simeth, bisacodyl , HYDROmorphone  (DILAUDID ) injection, menthol -cetylpyridinium **OR** phenol, [DISCONTINUED] metoCLOPramide  **OR** metoCLOPramide  (REGLAN ) injection, ondansetron  **OR** ondansetron  (ZOFRAN ) IV, oxyCODONE , sodium chloride  flush  Antimicrobials: Anti-infectives (From admission, onward)    Start     Dose/Rate Route Frequency Ordered Stop   02/11/23 0600  ceFAZolin  (ANCEF ) IVPB 2g/100 mL premix  Status:  Discontinued        2 g 200 mL/hr over 30 Minutes Intravenous On call to O.R. 02/10/23 1212 02/10/23 1224   02/10/23 2000  vancomycin  (VANCOCIN ) IVPB 1000 mg/200 mL premix        1,000 mg 200 mL/hr over 60 Minutes Intravenous Every 12 hours 02/10/23 1722 02/10/23 2227   02/10/23 1430  vancomycin  (VANCOCIN ) IVPB 1000 mg/200 mL premix  Status:  Discontinued        1,000 mg 200 mL/hr over 60 Minutes Intravenous To ShortStay Surgical 02/10/23 1225 02/10/23 1722   02/10/23 1430  levofloxacin  (LEVAQUIN ) IVPB 750 mg  Status:  Discontinued        750 mg 100 mL/hr over 90 Minutes Intravenous  Once 02/10/23 1225 02/10/23 1722        I have personally reviewed the following labs and images: CBC: Recent Labs  Lab 02/10/23 0002 02/11/23 0403 02/14/23 0521  WBC 11.1* 11.8* 6.4  HGB 11.3* 11.3* 11.3*  HCT 34.5* 35.1* 35.9*  MCV 90.8 94.1 94.2  PLT 193 185 249   BMP &GFR Recent Labs  Lab 02/10/23 0002 02/11/23 0403 02/14/23 0521  NA 142 132* 141  K 3.8 4.0 3.7  CL 108 100 104  CO2 28 24 28   GLUCOSE 134* 135* 106*  BUN 17 16 21   CREATININE 0.81 0.69 0.65  CALCIUM  8.7* 8.6* 9.1  MG  --   --  2.0  PHOS  --   --  2.9   Estimated Creatinine  Clearance: 40.7 mL/min (by C-G formula based on SCr of 0.65 mg/dL). Liver & Pancreas: No results for input(s): AST, ALT, ALKPHOS, BILITOT, PROT, ALBUMIN in the last 168 hours. No results for input(s): LIPASE, AMYLASE in the last 168 hours. No results for input(s): AMMONIA in the last 168 hours. Diabetic: No results for input(s): HGBA1C in the last 72 hours. No results for input(s): GLUCAP in the last 168 hours. Cardiac Enzymes: No results for input(s): CKTOTAL, CKMB, CKMBINDEX, TROPONINI in the last 168 hours. No results for input(s): PROBNP in the last 8760 hours. Coagulation Profile: No results for input(s): INR, PROTIME in the last 168 hours. Thyroid  Function Tests: No results for input(s): TSH, T4TOTAL, FREET4, T3FREE, THYROIDAB in the last 72 hours. Lipid Profile: No results for input(s): CHOL, HDL, LDLCALC, TRIG, CHOLHDL, LDLDIRECT in the last 72 hours. Anemia Panel:  No results for input(s): VITAMINB12, FOLATE, FERRITIN, TIBC, IRON, RETICCTPCT in the last 72 hours. Urine analysis:    Component Value Date/Time   COLORURINE YELLOW 10/29/2021 1938   APPEARANCEUR CLEAR 10/29/2021 1938   APPEARANCEUR Clear 11/23/2020 1121   LABSPEC >=1.030 10/29/2021 1938   PHURINE 5.5 10/29/2021 1938   GLUCOSEU NEGATIVE 10/29/2021 1938   GLUCOSEU NEGATIVE 06/14/2015 1452   HGBUR SMALL (A) 10/29/2021 1938   BILIRUBINUR Negative 09/11/2022 1404   BILIRUBINUR Negative 11/23/2020 1121   KETONESUR NEGATIVE 10/29/2021 1938   PROTEINUR Positive (A) 09/11/2022 1404   PROTEINUR NEGATIVE 10/29/2021 1938   UROBILINOGEN 1.0 09/11/2022 1404   UROBILINOGEN 0.2 06/14/2015 1452   NITRITE Negative 09/11/2022 1404   NITRITE NEGATIVE 10/29/2021 1938   LEUKOCYTESUR Large (3+) (A) 09/11/2022 1404   LEUKOCYTESUR SMALL (A) 10/29/2021 1938   Sepsis Labs: Invalid input(s): PROCALCITONIN, LACTICIDVEN  Microbiology: No results found for  this or any previous visit (from the past 240 hours).  Radiology Studies: No results found.    Vallerie Hentz T. Suheyb Raucci [facility]  If 7PM-7AM, please contact night-coverage [email or web address] 02/15/2023, 1:09 PM

## 2023-02-15 NOTE — TOC Progression Note (Addendum)
 Transition of Care [facility]) - Progression Note    Patient Details  Name: Priscilla Cantrell MRN: [id number] Date of Birth: 07/06/1935  Transition of Care [facility]) CM/SW Contact  Dalila Camellia [place], [place] Phone Number: 02/15/2023, 2:01 PM  Clinical Narrative:     CSW was informed that patient has chosen [facility].  CSW left message for admissions worker at [facility] to confirm bed is still available so insurance auth can be started.  TOC awaiting confirmation from SNF.  5:25pm CSW received message from [facility] they have a semi-private room available, CSW updated patient and she is okay with it.  CSW submitted for insurance authorization pending [id number].  CSW updated patient and Kristin at [facility] that [facility] is pending.  Updated therapy notes have been sent in the Hub.  TOC to continue to follow patient's progress throughout discharge planning.   Expected Discharge Plan: Skilled Nursing Facility Barriers to Discharge: Continued Medical Work up  Expected Discharge Plan and Services In-house Referral: Clinical Social Work   Post Acute Care Choice: Skilled Nursing Facility Living arrangements for the past 2 months: Single Family Home                                       Social Determinants of Health (SDOH) Interventions SDOH Screenings   Food Insecurity: No Food Insecurity (02/10/2023)  Housing: Low Risk  (02/10/2023)  Transportation Needs: No Transportation Needs (02/10/2023)  Utilities: Not At Risk (02/10/2023)  Alcohol  Screen: Low Risk  (02/01/2020)  Depression (PHQ2-9): Low Risk  (11/12/2022)  Financial Resource Strain: Low Risk  (03/19/2022)  Physical Activity: Sufficiently Active (03/19/2022)  Social Connections: Socially Integrated (02/10/2023)  Stress: No Stress Concern Present (03/19/2022)  Tobacco Use: Medium Risk (02/10/2023)    Readmission Risk Interventions     No data to display

## 2023-02-15 NOTE — Plan of Care (Signed)
  Problem: Health Behavior/Discharge Planning: Goal: Ability to manage health-related needs will improve Outcome: Progressing   Problem: Activity: Goal: Risk for activity intolerance will decrease Outcome: Progressing   Problem: Nutrition: Goal: Adequate nutrition will be maintained Outcome: Progressing   

## 2023-02-16 DIAGNOSIS — S73004A Unspecified dislocation of right hip, initial encounter: Secondary | ICD-10-CM | POA: Diagnosis not present

## 2023-02-16 MED ORDER — AMLODIPINE BESYLATE 10 MG PO TABS
10.0000 mg | ORAL_TABLET | Freq: Every day | ORAL | Status: DC
  Administered 2023-02-16 – 2023-02-17 (×2): 10 mg via ORAL
  Filled 2023-02-16 (×2): qty 1

## 2023-02-16 MED ORDER — AMLODIPINE BESYLATE 10 MG PO TABS: 10.0000 mg | ORAL_TABLET | Freq: Every day | ORAL | Status: DC

## 2023-02-16 MED ORDER — ACETAMINOPHEN 325 MG PO TABS
650.0000 mg | ORAL_TABLET | Freq: Four times a day (QID) | ORAL | Status: AC | PRN
  Administered 2023-02-16 – 2023-02-17 (×2): 650 mg via ORAL
  Filled 2023-02-16 (×2): qty 2

## 2023-02-16 NOTE — Care Management Important Message (Signed)
 Important Message  Patient Details IM Letter given Name: Priscilla Cantrell MRN: [id number] Date of Birth: [birth year]   Important Message Given:  Yes - Medicare IM     Caren Macadam 02/16/2023, 11:19 AM

## 2023-02-16 NOTE — Progress Notes (Signed)
 Physical Therapy Treatment Patient Details Name: Priscilla Cantrell MRN: [id number] DOB: 1935/03/16 Today's Date: 02/16/2023   History of Present Illness 88 yo female admitted with hip dislocation. S/P R THA rev + constrained liner 02/10/23. Hx of R THA with multiple procedures/surgeries 2* dislocation, RSV, COVID, COPD-O2 dep 2L, LE edema, flu, BPPV, Cdiff, peripheral neuralgia, RLS, osteoporosis, shingles, spinal stenosis, L THA 2019, falls    PT Comments  POD # 6 General Comments: Pt is AxO x 3 pleasant, still requiring re direction with her THR precautions esp inreguard to no hip flex >90 degrees.  Oh yeah pt respomds.  I get to moving and I forget.  Prior to admit, pt was living home with Spouse Indep (cleaning/dressing/amb with NO AD). Pt called out to use the bathroom.  General transfer comment: 50% VC's to avoid hip flex > 90 degrees esp when asissted to bathroom during sit to stand and stand to sit as well as to avoid leaning forwarf while seated to perform posterior peri care.  Pt also unsteady with tight turns and back steps to toilet.  Slow to self correct to midline.  HIGH FALL RISK. General Gait Details: decreased amb distance due to increased c/o fatigue after using the bathroom for a BM.  Trial RA decreased to 84% and HR increased to 141.  Reapplied 2 lts oxygen  as well as amb with walker avg sats 94% and HR 109.  Educated pt on using supplemental oxygen  esp during exerction.  Ex going to bathroom as pt has been removing her nasal cannula.  Trial amb without walker resulted in a near fall during 180 turn around and side stepping to [facility].  Rec cont use of walker. Pt will need ST Rehab at SNF to address mobility and functional decline prior to safely returning home.    If plan is discharge home, recommend the following: A little help with walking and/or transfers;A little help with bathing/dressing/bathroom;Assistance with cooking/housework;Assist for transportation;Help with stairs  or ramp for entrance   Can travel by private vehicle     Yes  Equipment Recommendations  Rolling walker (2 wheels);BSC/3in1    Recommendations for Other Services       Precautions / Restrictions Precautions Precautions: Fall;Posterior Hip Precaution Comments: needs posterior hip precaution reinforcement with mobility Restrictions Weight Bearing Restrictions Per Provider Order: No RLE Weight Bearing Per Provider Order: Weight bearing as tolerated LLE Weight Bearing Per Provider Order: Weight bearing as tolerated     Mobility  Bed Mobility               General bed mobility comments: pt sitting EOB on arrival eating breakfast.    Transfers Overall transfer level: Needs assistance Equipment used: Rolling walker (2 wheels) Transfers: Sit to/from Stand Sit to Stand: Contact guard assist, Min assist           General transfer comment: 50% VC's to avoid hip flex > 90 degrees esp when asissted to bathroom during sit to stand and stand to sit as well as to avoid leaning forwarf while seated to perform posterior peri care.  Pt also unsteady with tight turns and back steps to toilet.  Slow to self correct to midline.  HIGH FALL RISK.    Ambulation/Gait Ambulation/Gait assistance: Contact guard assist, Min assist Gait Distance (Feet): 45 Feet Assistive device: Rolling walker (2 wheels) Gait Pattern/deviations: Step-to pattern, Step-through pattern, Decreased stride length Gait velocity: decreased     General Gait Details: decreased amb distance due to increased c/o  fatigue after using the bathroom for a BM.  Trial RA decreased to 84% and HR increased to 141.  Reapplied 2 lts oxygen  as well as amb with walker avg sats 94% and HR 109.  Educated pt on using supplemental oxygen  esp during exerction.  Ex going to bathroom as pt has been removing her nasal cannula.  Trial amb without walker resulted in a near fall during 180 turn around and side stepping to [facility].  Rec cont  use of walker.   Stairs             Wheelchair Mobility     Tilt Bed    Modified Rankin (Stroke Patients Only)       Balance                                            Cognition Arousal: Alert Behavior During Therapy: WFL for tasks assessed/performed Overall Cognitive Status: Within Functional Limits for tasks assessed                                 General Comments: Pt is AxO x 3 pleasant, still requiring re direction with her THR precautions esp inreguard to no hip flex >90 degrees.  Oh yeah pt respomds.  I get to moving and I forget.  Prior to admit, pt was living home with Spouse Indep (cleaning/dressing/amb with NO AD).        Exercises      General Comments        Pertinent Vitals/Pain Pain Assessment Pain Assessment: Faces Faces Pain Scale: Hurts a little bit Pain Location: R hip with activity Pain Descriptors / Indicators: Discomfort, Grimacing, Tender Pain Intervention(s): Monitored during session, Premedicated before session, Repositioned, Ice applied    Home Living                          Prior Function            PT Goals (current goals can now be found in the care plan section) Progress towards PT goals: Progressing toward goals    Frequency    Min 1X/week      PT Plan      Co-evaluation              AM-PAC PT 6 Clicks Mobility   Outcome Measure  Help needed turning from your back to your side while in a flat bed without using bedrails?: A Little Help needed moving from lying on your back to sitting on the side of a flat bed without using bedrails?: A Little Help needed moving to and from a bed to a chair (including a wheelchair)?: A Little Help needed standing up from a chair using your arms (e.g., wheelchair or bedside chair)?: A Little Help needed to walk in hospital room?: A Lot Help needed climbing 3-5 steps with a railing? : Total 6 Click Score: 15    End of  Session Equipment Utilized During Treatment: Gait belt;Oxygen  Activity Tolerance: Patient limited by fatigue Patient left: in bed;with call bell/phone within reach Nurse Communication: Mobility status PT Visit Diagnosis: History of falling (Z91.81);Other abnormalities of gait and mobility (R26.89) Pain - Right/Left: Right Pain - part of body: Hip     Time: 9041-8977 PT Time Calculation (min) (ACUTE ONLY):  24 min  Charges:    $Gait Training: 8-22 mins $Therapeutic Activity: 8-22 mins PT General Charges $$ ACUTE PT VISIT: 1 Visit                     Katheryn Leap  PTA Acute  Rehabilitation Services Office M-F          [phone]

## 2023-02-16 NOTE — Progress Notes (Signed)
 PROGRESS NOTE  Priscilla Cantrell FMW:[id number] DOB: 07/13/1935   PCP: Candise Aleene DEL, MD  Patient is from: Home.  Lives with significant other.  DOA: 02/09/2023 LOS: 6  Chief complaints Chief Complaint  Patient presents with   Hip Pain     Brief Narrative / Interim history: 88 year old F with PMH of COPD, chronic hypoxic RF on 2 L, HTN, hypothyroidism, mild dementia, anxiety and recurrent hip dislocation presented to [facility] due to concern for hip dislocation.  Reportedly, she dropped something on the ground and reached down to get it when she twisted and felt pain and hip popping.  She had experienced multiple hip dislocation in the past. [facility] ED[facility] attempted reduction under moderation sedation however patient became hypoxic and reduction aborted.  Orthopedic surgery consulted and she was admitted to [facility] for surgical management.  Patient underwent right total hip revision by Dr. Ernie on 1/6.  Therapy recommended SNF.  Medically stable for discharge.   Subjective: Seen and examined earlier this morning.  No major events overnight of this morning.  No complaints.  Objective: Vitals:   02/16/23 0806 02/16/23 1025 02/16/23 1027 02/16/23 1423  BP:   121/77 129/65  Pulse:  99 79 73  Resp:  18 18 17   Temp:   98.2 F (36.8 C) 97.7 F (36.5 C)  TempSrc:   Oral   SpO2: 90% 92% 99% 96%  Weight:      Height:        Examination:  GENERAL: No apparent distress.  Nontoxic. HEENT: MMM.  Vision and hearing grossly intact.  NECK: Supple.  No apparent JVD.  RESP:  No IWOB.  Fair aeration bilaterally. CVS:  RRR. Heart sounds normal.  ABD/GI/GU: BS+. Abd soft, NTND.  MSK/EXT:  Moves extremities. No apparent deformity. No edema.  SKIN: no apparent skin lesion or wound NEURO: Awake, alert and oriented appropriately.  No apparent focal neuro deficit. PSYCH: Calm. Normal affect.   Procedures:  1/6-right total hip revision by Dr. Ernie  Microbiology  summarized: None  Assessment and plan: Recurrent right hip dislocation: -[facility] attempted closed reduction under moderation sedation which was aborted due to hypoxemia -S/p right total hip revision by Dr. Ernie on 1/6.  -Pain control.  Did not require pain medications.   -Ortho recommended aspirin  81 mg twice daily for 4 weeks postoperatively -Weightbearing as tolerated. -PT/OT recommended SNF.  TOC working on this.   Chronic COPD: Stable Chronic hypoxic respiratory failure: On 2 L at baseline.  Desaturated to 86% when ambulated on room air on 1/11. -Continue Trelegy Ellipta  and as needed nebulizers -Continue home oxygen    Essential Hypertension: Normotensive for most part.  On low-dose amlodipine . -Continue home amlodipine .   Hypothyroidism: - Continue home Synthroid    Dementia without behavioral disturbance: Awake and alert and fairly oriented. Generalized Anxiety Disorder/insomnia: Seems to take Xanax  as needed at home but did not require here. -Continue home Effexor  -Melatonin at night   Body mass index is 21.95 kg/m.         DVT prophylaxis:  SCDs Start: 02/10/23 1722 Place TED hose Start: 02/10/23 1722 SCDs Start: 02/10/23 0840  Code Status: Full code Family Communication: None at bedside today. Level of care: Med-Surg Status is: Inpatient Remains inpatient appropriate because: Short-term rehab postoperatively  Final disposition: SNF Consultants:  Orthopedic surgery  35 minutes with more than 50% spent in reviewing records, counseling patient/family and coordinating care.   Sch Meds:  Scheduled Meds:  amLODipine   10 mg  Oral Daily   aspirin   81 mg Oral BID   fluticasone  furoate-vilanterol  1 puff Inhalation Daily   And   umeclidinium bromide   1 puff Inhalation Daily   levothyroxine   100 mcg Oral Q0600   melatonin  5 mg Oral Q24H   pantoprazole   40 mg Oral BID   polyethylene glycol  17 g Oral BID   senna  2 tablet Oral QHS   sodium chloride   flush  3-10 mL Intravenous Q12H   venlafaxine   50 mg Oral BID   Continuous Infusions: PRN Meds:.acetaminophen , alum & mag hydroxide-simeth, bisacodyl , HYDROmorphone  (DILAUDID ) injection, menthol -cetylpyridinium **OR** phenol, [DISCONTINUED] metoCLOPramide  **OR** metoCLOPramide  (REGLAN ) injection, ondansetron  **OR** ondansetron  (ZOFRAN ) IV, oxyCODONE , sodium chloride  flush  Antimicrobials: Anti-infectives (From admission, onward)    Start     Dose/Rate Route Frequency Ordered Stop   02/11/23 0600  ceFAZolin  (ANCEF ) IVPB 2g/100 mL premix  Status:  Discontinued        2 g 200 mL/hr over 30 Minutes Intravenous On call to O.R. 02/10/23 1212 02/10/23 1224   02/10/23 2000  vancomycin  (VANCOCIN ) IVPB 1000 mg/200 mL premix        1,000 mg 200 mL/hr over 60 Minutes Intravenous Every 12 hours 02/10/23 1722 02/10/23 2227   02/10/23 1430  vancomycin  (VANCOCIN ) IVPB 1000 mg/200 mL premix  Status:  Discontinued        1,000 mg 200 mL/hr over 60 Minutes Intravenous To [facility] 02/10/23 1225 02/10/23 1722   02/10/23 1430  levofloxacin  (LEVAQUIN ) IVPB 750 mg  Status:  Discontinued        750 mg 100 mL/hr over 90 Minutes Intravenous  Once 02/10/23 1225 02/10/23 1722        I have personally reviewed the following labs and images: CBC: Recent Labs  Lab 02/10/23 0002 02/11/23 0403 02/14/23 0521  WBC 11.1* 11.8* 6.4  HGB 11.3* 11.3* 11.3*  HCT 34.5* 35.1* 35.9*  MCV 90.8 94.1 94.2  PLT 193 185 249   BMP &GFR Recent Labs  Lab 02/10/23 0002 02/11/23 0403 02/14/23 0521  NA 142 132* 141  K 3.8 4.0 3.7  CL 108 100 104  CO2 28 24 28   GLUCOSE 134* 135* 106*  BUN 17 16 21   CREATININE 0.81 0.69 0.65  CALCIUM  8.7* 8.6* 9.1  MG  --   --  2.0  PHOS  --   --  2.9   Estimated Creatinine Clearance: 40.7 mL/min (by C-G formula based on SCr of 0.65 mg/dL). Liver & Pancreas: No results for input(s): AST, ALT, ALKPHOS, BILITOT, PROT, ALBUMIN in the last 168 hours. No results  for input(s): LIPASE, AMYLASE in the last 168 hours. No results for input(s): AMMONIA in the last 168 hours. Diabetic: No results for input(s): HGBA1C in the last 72 hours. No results for input(s): GLUCAP in the last 168 hours. Cardiac Enzymes: No results for input(s): CKTOTAL, CKMB, CKMBINDEX, TROPONINI in the last 168 hours. No results for input(s): PROBNP in the last 8760 hours. Coagulation Profile: No results for input(s): INR, PROTIME in the last 168 hours. Thyroid  Function Tests: No results for input(s): TSH, T4TOTAL, FREET4, T3FREE, THYROIDAB in the last 72 hours. Lipid Profile: No results for input(s): CHOL, HDL, LDLCALC, TRIG, CHOLHDL, LDLDIRECT in the last 72 hours. Anemia Panel: No results for input(s): VITAMINB12, FOLATE, FERRITIN, TIBC, IRON, RETICCTPCT in the last 72 hours. Urine analysis:    Component Value Date/Time   COLORURINE YELLOW 10/29/2021 1938   APPEARANCEUR CLEAR 10/29/2021 1938   APPEARANCEUR  Clear 11/23/2020 1121   LABSPEC >=1.030 10/29/2021 1938   PHURINE 5.5 10/29/2021 1938   GLUCOSEU NEGATIVE 10/29/2021 1938   GLUCOSEU NEGATIVE 06/14/2015 1452   HGBUR SMALL (A) 10/29/2021 1938   BILIRUBINUR Negative 09/11/2022 1404   BILIRUBINUR Negative 11/23/2020 1121   KETONESUR NEGATIVE 10/29/2021 1938   PROTEINUR Positive (A) 09/11/2022 1404   PROTEINUR NEGATIVE 10/29/2021 1938   UROBILINOGEN 1.0 09/11/2022 1404   UROBILINOGEN 0.2 06/14/2015 1452   NITRITE Negative 09/11/2022 1404   NITRITE NEGATIVE 10/29/2021 1938   LEUKOCYTESUR Large (3+) (A) 09/11/2022 1404   LEUKOCYTESUR SMALL (A) 10/29/2021 1938   Sepsis Labs: Invalid input(s): PROCALCITONIN, LACTICIDVEN  Microbiology: No results found for this or any previous visit (from the past 240 hours).  Radiology Studies: No results found.    Aicia Babinski T. Shem Plemmons [facility]  If 7PM-7AM, please contact  night-coverage [email or web address] 02/16/2023, 3:29 PM

## 2023-02-16 NOTE — Progress Notes (Signed)
   02/16/23 1400  Spiritual Encounters  Type of Visit Initial  Care provided to: Pt and family  Referral source Patient request;Chaplain assessment  Reason for visit Urgent spiritual support  OnCall Visit No  Spiritual Framework  Presenting Themes Meaning/purpose/sources of inspiration;Goals in life/care;Values and beliefs;Significant life change;Caregiving needs;Impactful experiences and emotions;Coping tools;Courage hope and growth;Rituals and practive  Patient Stress Factors Major life changes  Family Stress Factors None identified  Interventions  Spiritual Care Interventions Made Established relationship of care and support;Compassionate presence;Reflective listening;Normalization of emotions;Explored ethical dilemma;Decision-making support/facilitation;Reconciliation with self/others;Narrative/life review;Explored values/beliefs/practices/strengths;Meaning making;Prayer  Intervention Outcomes  Outcomes Connection to spiritual care;Awareness around self/spiritual resourses;Connection to values and goals of care;Autonomy/agency;Awareness of health;Awareness of support;Reduced anxiety;Patient family open to resources;Connected to spiritual community   Chaplain met with Patient at bedside who was being vissited by her Significant Other (SO) Patient spoke at length regarding her faith life narrative and her religiusd strenngths.  The community and family supoprt were in full arrangement fo rher - NO spiritual distress was exhibited.  Patient requested prayer and Chaplain prayed with hte in room.

## 2023-02-16 NOTE — TOC Progression Note (Signed)
 Transition of Care [facility]) - Progression Note    Patient Details  Name: Priscilla Cantrell MRN: [id number] Date of Birth: 1935-03-20  [facility] CM/SW Contact  NORMAN ASPEN, LCSW Phone Number: 02/16/2023, 4:18 PM  Clinical Narrative:     Have just received insurance authorization for SNF at [facility].  MD and pt aware.  Facility will plan to admit tomorrow.  Expected Discharge Plan: Skilled Nursing Facility Barriers to Discharge: Continued Medical Work up  Expected Discharge Plan and Services In-house Referral: Clinical Social Work   Post Acute Care Choice: Skilled Nursing Facility Living arrangements for the past 2 months: Single Family Home Expected Discharge Date: 02/16/23                                     Social Determinants of Health (SDOH) Interventions SDOH Screenings   Food Insecurity: No Food Insecurity (02/10/2023)  Housing: Low Risk  (02/10/2023)  Transportation Needs: No Transportation Needs (02/10/2023)  Utilities: Not At Risk (02/10/2023)  Alcohol  Screen: Low Risk  (02/01/2020)  Depression (PHQ2-9): Low Risk  (11/12/2022)  Financial Resource Strain: Low Risk  (03/19/2022)  Physical Activity: Sufficiently Active (03/19/2022)  Social Connections: Socially Integrated (02/10/2023)  Stress: No Stress Concern Present (03/19/2022)  Tobacco Use: Medium Risk (02/10/2023)    Readmission Risk Interventions     No data to display

## 2023-02-16 NOTE — Plan of Care (Signed)
  Problem: Education: Goal: Knowledge of General Education information will improve Description: Including pain rating scale, medication(s)/side effects and non-pharmacologic comfort measures 02/16/2023 0019 by Lavina Hermina CROME, RN Outcome: Progressing 02/16/2023 0019 by Lavina Hermina CROME, RN Outcome: Progressing   Problem: Health Behavior/Discharge Planning: Goal: Ability to manage health-related needs will improve 02/16/2023 0019 by Lavina Hermina CROME, RN Outcome: Progressing 02/16/2023 0019 by Lavina Hermina CROME, RN Outcome: Progressing   Problem: Clinical Measurements: Goal: Ability to maintain clinical measurements within normal limits will improve 02/16/2023 0019 by Lavina Hermina CROME, RN Outcome: Progressing 02/16/2023 0019 by Lavina Hermina CROME, RN Outcome: Progressing Goal: Will remain free from infection 02/16/2023 0019 by Lavina Hermina CROME, RN Outcome: Progressing 02/16/2023 0019 by Lavina Hermina CROME, RN Outcome: Progressing Goal: Diagnostic test results will improve 02/16/2023 0019 by Lavina Hermina CROME, RN Outcome: Progressing 02/16/2023 0019 by Lavina Hermina CROME, RN Outcome: Progressing Goal: Respiratory complications will improve 02/16/2023 0019 by Lavina Hermina CROME, RN Outcome: Progressing 02/16/2023 0019 by Lavina Hermina CROME, RN Outcome: Progressing Goal: Cardiovascular complication will be avoided 02/16/2023 0019 by Lavina Hermina CROME, RN Outcome: Progressing 02/16/2023 0019 by Lavina Hermina CROME, RN Outcome: Progressing   Problem: Activity: Goal: Risk for activity intolerance will decrease 02/16/2023 0019 by Lavina Hermina CROME, RN Outcome: Progressing 02/16/2023 0019 by Lavina Hermina CROME, RN Outcome: Progressing   Problem: Nutrition: Goal: Adequate nutrition will be maintained 02/16/2023 0019 by Lavina Hermina CROME, RN Outcome: Progressing 02/16/2023 0019 by Lavina Hermina CROME, RN Outcome: Progressing   Problem: Coping: Goal: Level of anxiety will decrease 02/16/2023 0019 by Lavina Hermina CROME, RN Outcome: Progressing 02/16/2023 0019 by Lavina Hermina CROME, RN Outcome: Progressing

## 2023-02-17 DIAGNOSIS — Z96641 Presence of right artificial hip joint: Secondary | ICD-10-CM | POA: Diagnosis not present

## 2023-02-17 DIAGNOSIS — Z471 Aftercare following joint replacement surgery: Secondary | ICD-10-CM | POA: Diagnosis not present

## 2023-02-17 DIAGNOSIS — M6281 Muscle weakness (generalized): Secondary | ICD-10-CM | POA: Diagnosis not present

## 2023-02-17 DIAGNOSIS — J449 Chronic obstructive pulmonary disease, unspecified: Secondary | ICD-10-CM | POA: Diagnosis not present

## 2023-02-17 DIAGNOSIS — I10 Essential (primary) hypertension: Secondary | ICD-10-CM | POA: Diagnosis not present

## 2023-02-17 DIAGNOSIS — K219 Gastro-esophageal reflux disease without esophagitis: Secondary | ICD-10-CM | POA: Diagnosis not present

## 2023-02-17 DIAGNOSIS — F0390 Unspecified dementia without behavioral disturbance: Secondary | ICD-10-CM | POA: Diagnosis not present

## 2023-02-17 DIAGNOSIS — G4700 Insomnia, unspecified: Secondary | ICD-10-CM | POA: Diagnosis not present

## 2023-02-17 DIAGNOSIS — E039 Hypothyroidism, unspecified: Secondary | ICD-10-CM | POA: Diagnosis not present

## 2023-02-17 DIAGNOSIS — R1310 Dysphagia, unspecified: Secondary | ICD-10-CM | POA: Diagnosis not present

## 2023-02-17 DIAGNOSIS — J9610 Chronic respiratory failure, unspecified whether with hypoxia or hypercapnia: Secondary | ICD-10-CM | POA: Diagnosis not present

## 2023-02-17 DIAGNOSIS — T84020A Dislocation of internal right hip prosthesis, initial encounter: Secondary | ICD-10-CM | POA: Diagnosis not present

## 2023-02-17 DIAGNOSIS — N189 Chronic kidney disease, unspecified: Secondary | ICD-10-CM | POA: Diagnosis not present

## 2023-02-17 DIAGNOSIS — R531 Weakness: Secondary | ICD-10-CM | POA: Diagnosis not present

## 2023-02-17 DIAGNOSIS — R262 Difficulty in walking, not elsewhere classified: Secondary | ICD-10-CM | POA: Diagnosis not present

## 2023-02-17 DIAGNOSIS — S73004D Unspecified dislocation of right hip, subsequent encounter: Secondary | ICD-10-CM | POA: Diagnosis not present

## 2023-02-17 DIAGNOSIS — M24451 Recurrent dislocation, right hip: Secondary | ICD-10-CM | POA: Diagnosis not present

## 2023-02-17 DIAGNOSIS — Z7401 Bed confinement status: Secondary | ICD-10-CM | POA: Diagnosis not present

## 2023-02-17 DIAGNOSIS — R2681 Unsteadiness on feet: Secondary | ICD-10-CM | POA: Diagnosis not present

## 2023-02-17 DIAGNOSIS — R5381 Other malaise: Secondary | ICD-10-CM | POA: Diagnosis not present

## 2023-02-17 DIAGNOSIS — R41841 Cognitive communication deficit: Secondary | ICD-10-CM | POA: Diagnosis not present

## 2023-02-17 DIAGNOSIS — S73004A Unspecified dislocation of right hip, initial encounter: Secondary | ICD-10-CM | POA: Diagnosis not present

## 2023-02-17 NOTE — TOC Transition Note (Signed)
 Transition of Care [facility]) - Discharge Note   Patient Details  Name: Priscilla Cantrell MRN: [id number] Date of Birth: 11-Jul-1935  Transition of Care [facility] CM/SW Contact:  NORMAN ASPEN, LCSW Phone Number: 02/17/2023, 10:44 AM   Clinical Narrative:     Pt medically cleared for dc today to [facility]  Pt and friend, [name] aware and agreeable.  PTAR called at 10:40am.  RN to call report to [phone].  No further TOC needs.  Final next level of care: Skilled Nursing Facility Barriers to Discharge: Barriers Resolved   Patient Goals and CMS Choice   CMS Medicare.gov Compare Post Acute Care list provided to:: Patient Choice offered to / list presented to : Patient      Discharge Placement              Patient chooses bed at: [facility] Patient to be transferred to facility by: PTAR Name of family member notified: friend, [name] Patient and family notified of of transfer: 02/17/23  Discharge Plan and Services Additional resources added to the After Visit Summary for   In-house Referral: Clinical Social Work   Post Acute Care Choice: Skilled Nursing Facility                               Social Drivers of Health (SDOH) Interventions SDOH Screenings   Food Insecurity: No Food Insecurity (02/10/2023)  Housing: Low Risk  (02/10/2023)  Transportation Needs: No Transportation Needs (02/10/2023)  Utilities: Not At Risk (02/10/2023)  Alcohol  Screen: Low Risk  (02/01/2020)  Depression (PHQ2-9): Low Risk  (11/12/2022)  Financial Resource Strain: Low Risk  (03/19/2022)  Physical Activity: Sufficiently Active (03/19/2022)  Social Connections: Socially Integrated (02/10/2023)  Stress: No Stress Concern Present (03/19/2022)  Tobacco Use: Medium Risk (02/10/2023)     Readmission Risk Interventions     No data to display

## 2023-02-17 NOTE — Discharge Summary (Signed)
 Physician Discharge Summary  Priscilla Cantrell FMW:[id number] DOB: Nov 14, 1935 DOA: 02/09/2023  PCP: Candise Aleene DEL, MD  Admit date: 02/09/2023 Discharge date: 02/17/2023 Admitted From: Home Disposition: SNF Recommendations for Outpatient Follow-up:  Outpatient follow-up with orthopedic surgery as below Check CMP and CBC in 1 week Please follow up on the following pending results: None  Discharge Condition: Stable CODE STATUS: Full code  Follow-up Information     Ernie Cough, MD. Schedule an appointment as soon as possible for a visit in 2 week(s).   Specialty: Orthopedic Surgery Contact information: [address] [place] [address] [phone]                 Hospital course 88 year old F with PMH of COPD, chronic hypoxic RF on 2 L, HTN, hypothyroidism, mild dementia, anxiety and recurrent hip dislocation presented to [facility] due to concern for hip dislocation.  Reportedly, she dropped something on the ground and reached down to get it when she twisted and felt pain and hip popping.  She had experienced multiple hip dislocation in the past. [facility] ED[facility] attempted reduction under moderation sedation however patient became hypoxic and reduction aborted.  Orthopedic surgery consulted and she was admitted to [facility] for surgical management.   Patient underwent right total hip revision by Dr. Ernie on 1/6.  Therapy recommended SNF.  Aspirin  81 mg twice daily for 4 weeks for VTE prophylaxis.  Pain well-controlled with as needed Tylenol .  She did not require opiate analgesics.  Outpatient follow-up with orthopedic surgery as above.  See individual problem list below for more.   Problems addressed during this hospitalization Recurrent right hip dislocation: -[facility] attempted closed reduction under moderation sedation which was aborted due to hypoxemia -S/p right total hip revision by Dr. Ernie on 1/6.  -Tylenol  as needed for pain -Aspirin  81  mg twice daily for 4 weeks postoperatively -Weightbearing as tolerated.  Posterior hip precaution. -PT/OT at SNF   Chronic COPD: Stable Chronic hypoxic respiratory failure: On 2 L at baseline.  Desaturated to 86% when ambulated on room air on 1/11. -Continue Trelegy Ellipta  and as needed nebulizers -Continue home oxygen    Essential Hypertension: Normotensive for most part.  On low-dose amlodipine . -Increased home amlodipine  to 10 mg daily   Hypothyroidism: - Continue home Synthroid    Dementia without behavioral disturbance: Awake and alert and fairly oriented. Generalized Anxiety Disorder/insomnia: Seems to take Xanax  as needed at home but did not require here. -Continue home Effexor  -Melatonin at night                Time spent 35 minutes  Vital signs Vitals:   02/16/23 1027 02/16/23 1423 02/16/23 1758 02/16/23 2113  BP: 121/77 129/65 120/65 138/65  Pulse: 79 73 78 66  Temp: 98.2 F (36.8 C) 97.7 F (36.5 C) (!) 97.3 F (36.3 C) 98.1 F (36.7 C)  Resp: 18 17 17 18   Height:      Weight:      SpO2: 99% 96% 97% 99%  TempSrc: Oral   Oral  BMI (Calculated):         Discharge exam  GENERAL: No apparent distress.  Nontoxic. HEENT: MMM.  Vision and hearing grossly intact.  NECK: Supple.  No apparent JVD.  RESP:  No IWOB.  Fair aeration bilaterally. CVS:  RRR. Heart sounds normal.  ABD/GI/GU: BS+. Abd soft, NTND.  MSK/EXT:  Moves extremities. No apparent deformity. No edema.  SKIN: no apparent skin lesion or wound NEURO:  Awake, alert and oriented appropriately.  No apparent focal neuro deficit. PSYCH: Calm. Normal affect.   Discharge Instructions Discharge Instructions     Diet general   Complete by: As directed    Increase activity slowly   Complete by: As directed       Allergies as of 02/17/2023       Reactions   Losartan  Other (See Comments)   hyperkalemia   Penicillins Hives, Swelling, Rash   Has patient had a PCN reaction causing immediate  rash, facial/tongue/throat swelling, SOB or lightheadedness with hypotension: YES Has patient had a PCN reaction causing severe rash involving mucus membranes or skin necrosis: NO Has patient had a PCN reaction that required hospitalization NO Has patient had a PCN reaction occurring within the last 10 years: NO If all of the above answers are NO, then may proceed with Cephalosporin use.   Cefixime [kdc:cefixime] Other (See Comments)   unspecified   Gabapentin  Other (See Comments)   Feels woozy   Indocin [indomethacin] Other (See Comments)   Painful tongue and throat   Singulair  [montelukast  Sodium]    Chest tightness, decreased mental clarity   Sulfa Antibiotics Other (See Comments)   unspecified   Ace Inhibitors Other (See Comments)   cough   Statins Other (See Comments)   Myalgias        Medication List     STOP taking these medications    ALPRAZolam  0.5 MG tablet Commonly known as: XANAX        TAKE these medications    acetaminophen  325 MG tablet Commonly known as: TYLENOL  Take 2 tablets (650 mg total) by mouth every 6 (six) hours.   albuterol  108 (90 Base) MCG/ACT inhaler Commonly known as: ProAir  HFA Inhale 2 puffs into the lungs every 4 (four) hours as needed for wheezing or shortness of breath.   amLODipine  10 MG tablet Commonly known as: NORVASC  Take 1 tablet (10 mg total) by mouth daily. What changed:  medication strength See the new instructions.   aspirin  81 MG chewable tablet Chew 1 tablet (81 mg total) by mouth 2 (two) times daily for 28 days.   fluticasone  50 MCG/ACT nasal spray Commonly known as: FLONASE  Place 2 sprays into both nostrils daily.   levothyroxine  100 MCG tablet Commonly known as: SYNTHROID  TAKE 1 TABLET BY MOUTH EVERY DAY What changed:  how much to take how to take this when to take this   nystatin  100000 UNIT/ML suspension Commonly known as: MYCOSTATIN  Take 10 mLs by mouth 4 (four) times daily.   pantoprazole  40 MG  tablet Commonly known as: PROTONIX  Take 1 tablet (40 mg total) by mouth 2 (two) times daily.   polyethylene glycol 17 g packet Commonly known as: MiraLax  Take 17 g by mouth daily as needed for mild constipation.   Pulse Oximeter Misc Check oxygen  level as needed   senna-docusate 8.6-50 MG tablet Commonly known as: Senokot-S Take 2 tablets by mouth 2 (two) times daily between meals as needed for moderate constipation.   Trelegy Ellipta  100-62.5-25 MCG/ACT Aepb Generic drug: Fluticasone -Umeclidin-Vilant Inhale 1 puff into the lungs daily.   venlafaxine  50 MG tablet Commonly known as: EFFEXOR  Take 50 mg by mouth 2 (two) times daily.   vitamin B-12 100 MCG tablet Commonly known as: CYANOCOBALAMIN  Take 100 mcg by mouth daily.   VITAMIN D  PO Take 1 tablet by mouth daily.        Consultations: Orthopedic surgery  Procedures/Studies: 1/6-right total hip revision by Dr. Ernie  DG Pelvis Portable Result Date: 02/10/2023 CLINICAL DATA:  Status post right total hip arthroplasty revision EXAM: PORTABLE PELVIS 1-2 VIEWS COMPARISON:  02/09/2023 for recurrent instability FINDINGS: Right hip prosthesis noted with new constrained liner in place. No periprosthetic fracture or acute complicating feature. The patient also has an existing left hip prosthesis. IMPRESSION: 1. Right hip prosthesis with new constrained liner in place. No acute complicating feature. Electronically Signed   By: Ryan Salvage M.D.   On: 02/10/2023 19:56   DG HIP PORT UNILAT WITH PELVIS 1V RIGHT Result Date: 02/09/2023 CLINICAL DATA:  Postreduction EXAM: DG HIP (WITH OR WITHOUT PELVIS) 1V PORT RIGHT COMPARISON:  02/09/2023 FINDINGS: Prior right hip replacement. Continued superior dislocation. No visible fracture. IMPRESSION: Continued superior dislocation of the right hip replacement. Electronically Signed   By: Franky Crease M.D.   On: 02/09/2023 22:07   DG Hip Unilat  With Pelvis 2-3 Views Right Result Date:  02/09/2023 CLINICAL DATA:  Felt in right hip pop, pain EXAM: DG HIP (WITH OR WITHOUT PELVIS) 2-3V RIGHT COMPARISON:  05/29/2022 FINDINGS: Patient is status post bilateral hip replacements. There is superior dislocation of the right hip replacement. No fracture. SI joints symmetric and unremarkable. IMPRESSION: Bilateral hip replacements. Superior dislocation of the right hip replacement. Electronically Signed   By: Franky Crease M.D.   On: 02/09/2023 20:57       The results of significant diagnostics from this hospitalization (including imaging, microbiology, ancillary and laboratory) are listed below for reference.     Microbiology: No results found for this or any previous visit (from the past 240 hours).   Labs:  CBC: Recent Labs  Lab 02/11/23 0403 02/14/23 0521  WBC 11.8* 6.4  HGB 11.3* 11.3*  HCT 35.1* 35.9*  MCV 94.1 94.2  PLT 185 249   BMP &GFR Recent Labs  Lab 02/11/23 0403 02/14/23 0521  NA 132* 141  K 4.0 3.7  CL 100 104  CO2 24 28  GLUCOSE 135* 106*  BUN 16 21  CREATININE 0.69 0.65  CALCIUM  8.6* 9.1  MG  --  2.0  PHOS  --  2.9   Estimated Creatinine Clearance: 40.7 mL/min (by C-G formula based on SCr of 0.65 mg/dL). Liver & Pancreas: No results for input(s): AST, ALT, ALKPHOS, BILITOT, PROT, ALBUMIN in the last 168 hours. No results for input(s): LIPASE, AMYLASE in the last 168 hours. No results for input(s): AMMONIA in the last 168 hours. Diabetic: No results for input(s): HGBA1C in the last 72 hours. No results for input(s): GLUCAP in the last 168 hours. Cardiac Enzymes: No results for input(s): CKTOTAL, CKMB, CKMBINDEX, TROPONINI in the last 168 hours. No results for input(s): PROBNP in the last 8760 hours. Coagulation Profile: No results for input(s): INR, PROTIME in the last 168 hours. Thyroid  Function Tests: No results for input(s): TSH, T4TOTAL, FREET4, T3FREE, THYROIDAB in the last 72 hours. Lipid  Profile: No results for input(s): CHOL, HDL, LDLCALC, TRIG, CHOLHDL, LDLDIRECT in the last 72 hours. Anemia Panel: No results for input(s): VITAMINB12, FOLATE, FERRITIN, TIBC, IRON, RETICCTPCT in the last 72 hours. Urine analysis:    Component Value Date/Time   COLORURINE YELLOW 10/29/2021 1938   APPEARANCEUR CLEAR 10/29/2021 1938   APPEARANCEUR Clear 11/23/2020 1121   LABSPEC >=1.030 10/29/2021 1938   PHURINE 5.5 10/29/2021 1938   GLUCOSEU NEGATIVE 10/29/2021 1938   GLUCOSEU NEGATIVE 06/14/2015 1452   HGBUR SMALL (A) 10/29/2021 1938   BILIRUBINUR Negative 09/11/2022 1404   BILIRUBINUR Negative 11/23/2020 1121  KETONESUR NEGATIVE 10/29/2021 1938   PROTEINUR Positive (A) 09/11/2022 1404   PROTEINUR NEGATIVE 10/29/2021 1938   UROBILINOGEN 1.0 09/11/2022 1404   UROBILINOGEN 0.2 06/14/2015 1452   NITRITE Negative 09/11/2022 1404   NITRITE NEGATIVE 10/29/2021 1938   LEUKOCYTESUR Large (3+) (A) 09/11/2022 1404   LEUKOCYTESUR SMALL (A) 10/29/2021 1938   Sepsis Labs: Invalid input(s): PROCALCITONIN, LACTICIDVEN   SIGNED:  Faun Mcqueen T Shoji Pertuit, MD  [facility] 02/17/2023, 7:53 AM

## 2023-02-17 NOTE — Plan of Care (Signed)

## 2023-02-17 NOTE — Care Management Important Message (Signed)
 Important Message  Patient Details IM Letter given. Name: MIANA POLITTE MRN: [id number] Date of Birth: 08-19-1935   Important Message Given:  Yes - Medicare IM     Caren Macadam 02/17/2023, 12:24 PM

## 2023-02-18 DIAGNOSIS — K219 Gastro-esophageal reflux disease without esophagitis: Secondary | ICD-10-CM | POA: Diagnosis not present

## 2023-02-18 DIAGNOSIS — M24451 Recurrent dislocation, right hip: Secondary | ICD-10-CM | POA: Diagnosis not present

## 2023-02-18 DIAGNOSIS — N189 Chronic kidney disease, unspecified: Secondary | ICD-10-CM | POA: Diagnosis not present

## 2023-02-18 DIAGNOSIS — J449 Chronic obstructive pulmonary disease, unspecified: Secondary | ICD-10-CM | POA: Diagnosis not present

## 2023-02-18 DIAGNOSIS — I10 Essential (primary) hypertension: Secondary | ICD-10-CM | POA: Diagnosis not present

## 2023-02-18 DIAGNOSIS — R5381 Other malaise: Secondary | ICD-10-CM | POA: Diagnosis not present

## 2023-02-18 DIAGNOSIS — J9610 Chronic respiratory failure, unspecified whether with hypoxia or hypercapnia: Secondary | ICD-10-CM | POA: Diagnosis not present

## 2023-02-18 DIAGNOSIS — E039 Hypothyroidism, unspecified: Secondary | ICD-10-CM | POA: Diagnosis not present

## 2023-02-18 DIAGNOSIS — G4700 Insomnia, unspecified: Secondary | ICD-10-CM | POA: Diagnosis not present

## 2023-02-19 DIAGNOSIS — J9610 Chronic respiratory failure, unspecified whether with hypoxia or hypercapnia: Secondary | ICD-10-CM | POA: Diagnosis not present

## 2023-02-19 DIAGNOSIS — E039 Hypothyroidism, unspecified: Secondary | ICD-10-CM | POA: Diagnosis not present

## 2023-02-19 DIAGNOSIS — F0390 Unspecified dementia without behavioral disturbance: Secondary | ICD-10-CM | POA: Diagnosis not present

## 2023-02-19 DIAGNOSIS — M24451 Recurrent dislocation, right hip: Secondary | ICD-10-CM | POA: Diagnosis not present

## 2023-02-19 DIAGNOSIS — N189 Chronic kidney disease, unspecified: Secondary | ICD-10-CM | POA: Diagnosis not present

## 2023-02-19 DIAGNOSIS — R5381 Other malaise: Secondary | ICD-10-CM | POA: Diagnosis not present

## 2023-02-19 DIAGNOSIS — J449 Chronic obstructive pulmonary disease, unspecified: Secondary | ICD-10-CM | POA: Diagnosis not present

## 2023-02-19 DIAGNOSIS — I10 Essential (primary) hypertension: Secondary | ICD-10-CM | POA: Diagnosis not present

## 2023-02-19 DIAGNOSIS — G4700 Insomnia, unspecified: Secondary | ICD-10-CM | POA: Diagnosis not present

## 2023-02-23 ENCOUNTER — Ambulatory Visit: Payer: Medicare HMO | Admitting: Family Medicine

## 2023-02-23 NOTE — Progress Notes (Deleted)
OFFICE VISIT  02/23/2023  CC: No chief complaint on file.   Patient is a 88 y.o. female who presents for hospital follow-up recent right total hip revision due to recurrent hip dislocation. Also follow-up chronic hypoxic respiratory failure, COPD, and hypertension.  HPI: Priscilla Cantrell dislocated her hip again on 02/09/2023.  There was an attempt by the EDP to reduce this but in the middle of the procedure see became hypoxic so it was aborted.  She was admitted by orthopedics for surgical management.  She underwent right total hip revision by Dr. Constance Goltz on 02/09/2023. SNF was recommended.  She is to take aspirin 81 mg twice a day for 4 weeks for VTE prophylaxis.  Past Medical History:  Diagnosis Date   Allergic rhinitis    with upper airway cough: as of 07/2017 pulm f/u she was instructed to use astelin, flonase, and saline more consistently   BPPV (benign paroxysmal positional vertigo) 03/2018   C. difficile colitis    Chest pain, non-cardiac    Cardiac CT showed no signif obstructive dz, mildly elevated calcium level (Dr. Jens Som).   Chronic cystitis with hematuria    Dr. Lenon Curt   Chronic hypoxemic respiratory failure (HCC) 02/02/2015   2L oxygen 24/7   Chronic renal insufficiency, stage 2 (mild)    GFR approx 60   Cold hands    NCS/EMGs normal.  Hand dysesthesias per neuro--reassured   COPD (chronic obstructive pulmonary disease) (HCC)    GOLD 4.  spirometry 01/10/09 FEV 0.97(52%), FEV1% 47.  With chronic bronchitis as of 07/2017.   DDD (degenerative disc disease), cervical    1998 MRI C5-6 impingemt (left).  MRI 2021 (neurol) 1 level canal stenosis, 1 level foraminal stenosis--->PT   DDD (degenerative disc disease), lumbar    Depression with anxiety 04/15/2011   Diarrheal disease Summer 2017   ? 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 Dr Kinnie Scales).  As of 10/05/15 pt reports GI dx'd her with C  diff and rx'd more flagyl and she is also on cholestyramine.  As of 02/2016, pt's sx's resolved completely.   Diverticulosis of colon    Procto '97 and colonoscopy 2002   Dysplastic nevus of upper extremity 04/2014   R tricep (Dr. Jorja Loa)   Edema of both lower extremities due to peripheral venous insufficiency    Varicosities bilat, swelling L>R.  R baker's cyst.  Got vein clinic eval 02/2018->sclerotherapy recommended but since no hemorrhage or ulceration, insurance will not cover the procedure.   Family history of adverse reaction to anesthesia    mother died when patient age 73 receiving anesthesia for thyroid surgery   Fibrocystic breast disease    w/fibroadenoma.  Bx's showed NO atypia (Dr. Jamey Ripa).  Mammo neg 2008.   GERD (gastroesophageal reflux disease)    History of double vision    Ophthalmologist, Dr. Elmer Picker, is further evaluating this with MRI  orbits and limited brain (myesthenia gravis testing neg 07/2010)   History of home oxygen therapy    uses 2 liters at hs   Hypertension    EKG 03/2010 normal   Hypothyroidism    Hashimoto's, dx'd 1989   Idiopathic peripheral neuropathy 04/2018   Sensory (numb feet) S1 distribution bilat, c/w spinal stenosis sensory neuropathy (no pain). 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  Labs [place], MRI C spine->some spondylosis/stenosis.  UE NCS/EMS: normal 04/2019.   Lesion of right native kidney 2017  found on CT 02/2015.  F/u MRI 03/31/15 showed it to be smaller and less likely of any significance but repeat CT renal protocol in 27mo was recommended by radiology.  This was done 10/26/15 and showed no interval change in the lesion, but b/c the lesion enhances with contrast, radiol rec'd urol consult (b/c pap renal RCC could not be excluded).  Saw urol 03/06/16--stable on u/s 09/2016 and 09/2017.   OAB (overactive bladder)    Osteoarthritis, hip, bilateral    she is s/p bilat THA as of 02/2018.    Osteoporosis    radius, T score -2.5->rec'd alendronate 08/2019->plan rpt DEXA 08/2021.   Pneumonia    Postmenopausal atrophic vaginitis    improved with estrace 2 X/week.   Prolapse of vaginal cuff after hysterectomy    10/2018: Dr. Katherina Right. Mild colpocele 04/2019 per GYN (no surgery)   Pulmonary nodule    CT chest 12/10, June 2011.  No nodule on CT 06/2010.   Recurrent Clostridium difficile diarrhea    Episode 09/19/2016: resolved with prolonged course of flagyl.  11/2016 recurrence treated with 10d of Dificid (Fidaxomicin) by GI (Dr. Kinnie Scales).   Recurrent UTI    likely related to pt's atrophic vaginitis.  Urol started vaginal estrace 03/2016.   Rib fracture 09/2018   R 7th, laterally-->sustained when her dog pulled her over and she fell.   RLS (restless legs syndrome)    neuro->gabapentin trial 2020/21   Shingles 02/2014   R abd/flank   Spinal stenosis of lumbar region with neurogenic claudication    2008 MRI (L3-4, L4-5, L5-S1).  +S1 distribution sensory loss bilat    Past Surgical History:  Procedure Laterality Date   ABDOMINAL HYSTERECTOMY  1978   no BSO per pt--nonmalignant reasons   APPENDECTOMY     BREAST CYST EXCISION     Last screening mammogram 10/2010 was normal.   CHOLECYSTECTOMY  2001   COLONOSCOPY  04/2005; 12/06/2015   2007 NORMAL.  2017 adenomatous polyp x 1.  Mod sigmoid diverticulosis (Dr. Kinnie Scales).     DEXA  05/18/2017; 08/7019   2019 NORMAL (T-score 0.1).  08/2019->T score -2.5->alendronate started.  03/2022 T score -2.7   HIP CLOSED REDUCTION Right 12/25/2015   Procedure: CLOSED REDUCTION HIP;  Surgeon: Yolonda Kida, MD;  Location: [facility];  Service: Orthopedics;  Laterality: Right;   NCV/EMS  10/2018   chronic and symmetric sensorimotor axonal polyneuropathy affecting the lower extremities   TONSILLECTOMY     TOTAL HIP ARTHROPLASTY Right 11/01/2014   Procedure: RIGHT TOTAL HIP ARTHROPLASTY;  Surgeon: Ranee Gosselin, MD;  Location: [facility];  Service:  Orthopedics;  Laterality: Right;   TOTAL HIP ARTHROPLASTY Left 12/08/2017   Procedure: LEFT TOTAL HIP ARTHROPLASTY ANTERIOR APPROACH;  Surgeon: Durene Romans, MD;  Location: [facility];  Service: Orthopedics;  Laterality: Left;  70 mins   TOTAL HIP REVISION Right 02/10/2023   Procedure: RIGHT TOTAL HIP REVISION;  Surgeon: Durene Romans, MD;  Location: [facility];  Service: Orthopedics;  Laterality: Right;   TRANSTHORACIC ECHOCARDIOGRAM  08/2014   2016 EF 60-65%, grade I diast dysfxn. 06/21/21 unchanged.   TUBAL LIGATION  1974    Outpatient Medications Prior to Visit  Medication Sig Dispense Refill   acetaminophen (TYLENOL) 325 MG tablet Take 2 tablets (650 mg total) by mouth every 6 (six) hours.     albuterol (PROAIR HFA) 108 (90 Base) MCG/ACT inhaler Inhale 2 puffs into the lungs every 4 (four) hours as needed for wheezing or shortness of breath.  17 each 1   amLODipine (NORVASC) 10 MG tablet Take 1 tablet (10 mg total) by mouth daily.     aspirin 81 MG chewable tablet Chew 1 tablet (81 mg total) by mouth 2 (two) times daily for 28 days.     fluticasone (FLONASE) 50 MCG/ACT nasal spray Place 2 sprays into both nostrils daily. 16 g 6   Fluticasone-Umeclidin-Vilant (TRELEGY ELLIPTA) 100-62.5-25 MCG/ACT AEPB Inhale 1 puff into the lungs daily. 180 each 3   levothyroxine (SYNTHROID) 100 MCG tablet TAKE 1 TABLET BY MOUTH EVERY DAY (Patient taking differently: Take 100 mcg by mouth daily. TAKE 1 TABLET BY MOUTH EVERY DAY) 90 tablet 0   Misc. Devices (PULSE OXIMETER) MISC Check oxygen level as needed 1 each 1   nystatin (MYCOSTATIN) 100000 UNIT/ML suspension Take 10 mLs by mouth 4 (four) times daily.     pantoprazole (PROTONIX) 40 MG tablet Take 1 tablet (40 mg total) by mouth 2 (two) times daily. 180 tablet 1   polyethylene glycol (MIRALAX) 17 g packet Take 17 g by mouth daily as needed for mild constipation.     senna-docusate (SENOKOT-S) 8.6-50 MG tablet Take 2 tablets by mouth 2 (two) times daily between  meals as needed for moderate constipation.     venlafaxine (EFFEXOR) 50 MG tablet Take 50 mg by mouth 2 (two) times daily.     vitamin B-12 (CYANOCOBALAMIN) 100 MCG tablet Take 100 mcg by mouth daily.     VITAMIN D PO Take 1 tablet by mouth daily.     No facility-administered medications prior to visit.    Allergies  Allergen Reactions   Losartan Other (See Comments)    hyperkalemia   Penicillins Hives, Swelling and Rash    Has patient had a PCN reaction causing immediate rash, facial/tongue/throat swelling, SOB or lightheadedness with hypotension: YES Has patient had a PCN reaction causing severe rash involving mucus membranes or skin necrosis: NO Has patient had a PCN reaction that required hospitalization NO Has patient had a PCN reaction occurring within the last 10 years: NO If all of the above answers are "NO", then may proceed with Cephalosporin use.    Cefixime [Kdc:Cefixime] Other (See Comments)    unspecified   Gabapentin Other (See Comments)    Feels woozy   Indocin [Indomethacin] Other (See Comments)    Painful tongue and throat   Singulair [Montelukast Sodium]     Chest tightness, decreased mental clarity   Sulfa Antibiotics Other (See Comments)    unspecified   Ace Inhibitors Other (See Comments)    cough   Statins Other (See Comments)    Myalgias     Review of Systems As per HPI  PE:    02/16/2023    9:13 PM 02/16/2023    5:58 PM 02/16/2023    2:23 PM  Vitals with BMI  Systolic 138 120 161  Diastolic 65 65 65  Pulse 66 78 73     Physical Exam  ***  LABS:  Last CBC Lab Results  Component Value Date   WBC 6.4 02/14/2023   HGB 11.3 (L) 02/14/2023   HCT 35.9 (L) 02/14/2023   MCV 94.2 02/14/2023   MCH 29.7 02/14/2023   RDW 12.8 02/14/2023   PLT 249 02/14/2023   Lab Results  Component Value Date   IRON 105 09/11/2022   TIBC 392 09/11/2022   FERRITIN 195 09/11/2022   Last metabolic panel Lab Results  Component Value Date   GLUCOSE 106  (H) 02/14/2023  NA 141 02/14/2023   K 3.7 02/14/2023   CL 104 02/14/2023   CO2 28 02/14/2023   BUN 21 02/14/2023   CREATININE 0.65 02/14/2023   GFRNONAA >60 02/14/2023   CALCIUM 9.1 02/14/2023   PHOS 2.9 02/14/2023   PROT 6.1 (L) 10/29/2021   ALBUMIN 3.1 (L) 10/29/2021   BILITOT 0.4 10/29/2021   ALKPHOS 58 10/29/2021   AST 18 10/29/2021   ALT 42 10/29/2021   ANIONGAP 9 02/14/2023   IMPRESSION AND PLAN:  No problem-specific Assessment & Plan notes found for this encounter.  ?no labs needed?  An After Visit Summary was printed and given to the patient.  FOLLOW UP: No follow-ups on file.  Signed:  Santiago Bumpers, MD           02/23/2023

## 2023-02-25 ENCOUNTER — Other Ambulatory Visit: Payer: Self-pay

## 2023-02-25 MED ORDER — TRELEGY ELLIPTA 100-62.5-25 MCG/ACT IN AEPB: 1.0000 | INHALATION_SPRAY | Freq: Every day | RESPIRATORY_TRACT | 3 refills | Status: DC

## 2023-02-25 NOTE — Telephone Encounter (Signed)
This former patient of dr Craige Cotta requesting refill is this okay to fill

## 2023-02-25 NOTE — Telephone Encounter (Signed)
Yes but due for follow-up in the next 30-60 days

## 2023-02-27 DIAGNOSIS — Z96641 Presence of right artificial hip joint: Secondary | ICD-10-CM | POA: Diagnosis not present

## 2023-02-27 DIAGNOSIS — Z471 Aftercare following joint replacement surgery: Secondary | ICD-10-CM | POA: Diagnosis not present

## 2023-03-02 DIAGNOSIS — N189 Chronic kidney disease, unspecified: Secondary | ICD-10-CM | POA: Diagnosis not present

## 2023-03-02 DIAGNOSIS — J9611 Chronic respiratory failure with hypoxia: Secondary | ICD-10-CM | POA: Diagnosis not present

## 2023-03-02 DIAGNOSIS — I129 Hypertensive chronic kidney disease with stage 1 through stage 4 chronic kidney disease, or unspecified chronic kidney disease: Secondary | ICD-10-CM | POA: Diagnosis not present

## 2023-03-02 DIAGNOSIS — J449 Chronic obstructive pulmonary disease, unspecified: Secondary | ICD-10-CM | POA: Diagnosis not present

## 2023-03-02 DIAGNOSIS — M48061 Spinal stenosis, lumbar region without neurogenic claudication: Secondary | ICD-10-CM | POA: Diagnosis not present

## 2023-03-02 DIAGNOSIS — M1990 Unspecified osteoarthritis, unspecified site: Secondary | ICD-10-CM | POA: Diagnosis not present

## 2023-03-02 DIAGNOSIS — T84020D Dislocation of internal right hip prosthesis, subsequent encounter: Secondary | ICD-10-CM | POA: Diagnosis not present

## 2023-03-02 DIAGNOSIS — M810 Age-related osteoporosis without current pathological fracture: Secondary | ICD-10-CM | POA: Diagnosis not present

## 2023-03-02 DIAGNOSIS — I739 Peripheral vascular disease, unspecified: Secondary | ICD-10-CM | POA: Diagnosis not present

## 2023-03-04 DIAGNOSIS — L648 Other androgenic alopecia: Secondary | ICD-10-CM | POA: Diagnosis not present

## 2023-03-05 ENCOUNTER — Encounter: Payer: Self-pay | Admitting: Family Medicine

## 2023-03-05 ENCOUNTER — Ambulatory Visit (INDEPENDENT_AMBULATORY_CARE_PROVIDER_SITE_OTHER): Payer: Medicare HMO | Admitting: Family Medicine

## 2023-03-05 VITALS — BP 140/80 | HR 64 | Wt 122.2 lb

## 2023-03-05 DIAGNOSIS — Z87828 Personal history of other (healed) physical injury and trauma: Secondary | ICD-10-CM

## 2023-03-05 DIAGNOSIS — Z96649 Presence of unspecified artificial hip joint: Secondary | ICD-10-CM | POA: Diagnosis not present

## 2023-03-05 DIAGNOSIS — K068 Other specified disorders of gingiva and edentulous alveolar ridge: Secondary | ICD-10-CM

## 2023-03-05 DIAGNOSIS — J9611 Chronic respiratory failure with hypoxia: Secondary | ICD-10-CM

## 2023-03-05 DIAGNOSIS — I10 Essential (primary) hypertension: Secondary | ICD-10-CM | POA: Diagnosis not present

## 2023-03-05 DIAGNOSIS — R42 Dizziness and giddiness: Secondary | ICD-10-CM | POA: Diagnosis not present

## 2023-03-05 DIAGNOSIS — R2681 Unsteadiness on feet: Secondary | ICD-10-CM | POA: Diagnosis not present

## 2023-03-05 MED ORDER — LIDOCAINE VISCOUS HCL 2 % MT SOLN: OROMUCOSAL | 2 refills | Status: DC

## 2023-03-05 NOTE — Progress Notes (Signed)
OFFICE VISIT  03/05/2023  CC:  Chief Complaint  Patient presents with   Hospitalization Follow-up    Rehab F/U; Pt had total hip replacement.     Patient is a 88 y.o. female who presents accompanied by significant other Fayrene Fearing for follow-up from rehab center d/t right total hip revision on 02/09/2023.  INTERIM HX: She was admitted 1/6 to 02/17/2023. Surgery went well, no postsurgical complications.  [facility]  She he got home 5 days ago. She has no hip pain.  She does feel a bit stiff .  She has had home health PT and OT evaluations and this is in process.  She is eating well. She has a small ulcer on the left lower gumline where her dentures are rubbing. No shortness of breath at rest.  No dyspnea on exertion, no chest pain.  She has an occasional feeling of lightheadedness when walking but nothing consistent. No falls.  ROS as above, plus--> no fevers, no wheezing, no cough, no HAs, no rashes, no melena/hematochezia.  No polyuria or polydipsia.  No myalgias or arthralgias.  No focal weakness, paresthesias, or tremors.  No acute vision or hearing abnormalities.  No dysuria or unusual/new urinary urgency or frequency.  No recent changes in lower legs. No n/v/d or abd pain.  No palpitations.    Past Medical History:  Diagnosis Date   Allergic rhinitis    with upper airway cough: as of 07/2017 pulm f/u she was instructed to use astelin, flonase, and saline more consistently   BPPV (benign paroxysmal positional vertigo) 03/2018   C. difficile colitis    Chest pain, non-cardiac    Cardiac CT showed no signif obstructive dz, mildly elevated calcium level (Dr. Jens Som).   Chronic cystitis with hematuria    Dr. Lenon Curt   Chronic hypoxemic respiratory failure (HCC) 02/02/2015   2L oxygen 24/7   Chronic renal insufficiency, stage 2 (mild)    GFR approx 60   Cold hands    NCS/EMGs normal.  Hand dysesthesias per neuro--reassured   COPD  (chronic obstructive pulmonary disease) (HCC)    GOLD 4.  spirometry 01/10/09 FEV 0.97(52%), FEV1% 47.  With chronic bronchitis as of 07/2017.   DDD (degenerative disc disease), cervical    1998 MRI C5-6 impingemt (left).  MRI 2021 (neurol) 1 level canal stenosis, 1 level foraminal stenosis--->PT   DDD (degenerative disc disease), lumbar    Depression with anxiety 04/15/2011   Diarrheal disease Summer 2017   ? 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 Dr Kinnie Scales).  As of 10/05/15 pt reports GI dx'd her with C diff and rx'd more flagyl and she is also on cholestyramine.  As of 02/2016, pt's sx's resolved completely.   Diverticulosis of colon    Procto '97 and colonoscopy 2002   Dysplastic nevus of upper extremity 04/2014   R tricep (Dr. Jorja Loa)   Edema of both lower extremities due to peripheral venous insufficiency    Varicosities bilat, swelling L>R.  R baker's cyst.  Got vein clinic eval 02/2018->sclerotherapy recommended but since no hemorrhage or ulceration, insurance will not cover the procedure.   Family history of adverse reaction to anesthesia    mother died when patient age 53 receiving anesthesia for thyroid surgery   Fibrocystic breast disease    w/fibroadenoma.  Bx's showed NO atypia (Dr. Jamey Ripa).  Mammo neg 2008.   GERD (gastroesophageal reflux disease)  History of double vision    Ophthalmologist, Dr. Elmer Picker, is further evaluating this with MRI  orbits and limited brain (myesthenia gravis testing neg 07/2010)   History of home oxygen therapy    uses 2 liters at hs   Hypertension    EKG 03/2010 normal   Hypothyroidism    Hashimoto's, dx'd 1989   Idiopathic peripheral neuropathy 04/2018   Sensory (numb feet) S1 distribution bilat, c/w spinal stenosis sensory neuropathy (no pain). 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  Labs  [place], MRI C spine->some spondylosis/stenosis.  UE NCS/EMS: normal 04/2019.   Lesion of right native kidney 2017   found on CT 02/2015.  F/u MRI 03/31/15 showed it to be smaller and less likely of any significance but repeat CT renal protocol in 43mo was recommended by radiology.  This was done 10/26/15 and showed no interval change in the lesion, but b/c the lesion enhances with contrast, radiol rec'd urol consult (b/c pap renal RCC could not be excluded).  Saw urol 03/06/16--stable on u/s 09/2016 and 09/2017.   OAB (overactive bladder)    Osteoarthritis, hip, bilateral    she is s/p bilat THA as of 02/2018.   Osteoporosis    radius, T score -2.5->rec'd alendronate 08/2019->plan rpt DEXA 08/2021.   Pneumonia    Postmenopausal atrophic vaginitis    improved with estrace 2 X/week.   Prolapse of vaginal cuff after hysterectomy    10/2018: Dr. Katherina Right. Mild colpocele 04/2019 per GYN (no surgery)   Pulmonary nodule    CT chest 12/10, June 2011.  No nodule on CT 06/2010.   Recurrent Clostridium difficile diarrhea    Episode 09/19/2016: resolved with prolonged course of flagyl.  11/2016 recurrence treated with 10d of Dificid (Fidaxomicin) by GI (Dr. Kinnie Scales).   Recurrent UTI    likely related to pt's atrophic vaginitis.  Urol started vaginal estrace 03/2016.   Rib fracture 09/2018   R 7th, laterally-->sustained when her dog pulled her over and she fell.   RLS (restless legs syndrome)    neuro->gabapentin trial 2020/21   Shingles 02/2014   R abd/flank   Spinal stenosis of lumbar region with neurogenic claudication    2008 MRI (L3-4, L4-5, L5-S1).  +S1 distribution sensory loss bilat    Past Surgical History:  Procedure Laterality Date   ABDOMINAL HYSTERECTOMY  1978   no BSO per pt--nonmalignant reasons   APPENDECTOMY     BREAST CYST EXCISION     Last screening mammogram 10/2010 was normal.   CHOLECYSTECTOMY  2001   COLONOSCOPY  04/2005; 12/06/2015   2007 NORMAL.  2017 adenomatous polyp x 1.  Mod sigmoid  diverticulosis (Dr. Kinnie Scales).     DEXA  05/18/2017; 08/7019   2019 NORMAL (T-score 0.1).  08/2019->T score -2.5->alendronate started.  03/2022 T score -2.7   HIP CLOSED REDUCTION Right 12/25/2015   Procedure: CLOSED REDUCTION HIP;  Surgeon: Yolonda Kida, MD;  Location: [facility];  Service: Orthopedics;  Laterality: Right;   NCV/EMS  10/2018   chronic and symmetric sensorimotor axonal polyneuropathy affecting the lower extremities   TONSILLECTOMY     TOTAL HIP ARTHROPLASTY Right 11/01/2014   Procedure: RIGHT TOTAL HIP ARTHROPLASTY;  Surgeon: Ranee Gosselin, MD;  Location: [facility];  Service: Orthopedics;  Laterality: Right;   TOTAL HIP ARTHROPLASTY Left 12/08/2017   Procedure: LEFT TOTAL HIP ARTHROPLASTY ANTERIOR APPROACH;  Surgeon: Durene Romans, MD;  Location: [facility];  Service: Orthopedics;  Laterality: Left;  70 mins  TOTAL HIP REVISION Right 02/10/2023   Procedure: RIGHT TOTAL HIP REVISION;  Surgeon: Durene Romans, MD;  Location: [facility];  Service: Orthopedics;  Laterality: Right;   TRANSTHORACIC ECHOCARDIOGRAM  08/2014   2016 EF 60-65%, grade I diast dysfxn. 06/21/21 unchanged.   TUBAL LIGATION  1974    Outpatient Medications Prior to Visit  Medication Sig Dispense Refill   acetaminophen (TYLENOL) 325 MG tablet Take 2 tablets (650 mg total) by mouth every 6 (six) hours.     albuterol (PROAIR HFA) 108 (90 Base) MCG/ACT inhaler Inhale 2 puffs into the lungs every 4 (four) hours as needed for wheezing or shortness of breath. 17 each 1   amLODipine (NORVASC) 10 MG tablet Take 1 tablet (10 mg total) by mouth daily.     fluticasone (FLONASE) 50 MCG/ACT nasal spray Place 2 sprays into both nostrils daily. 16 g 6   Fluticasone-Umeclidin-Vilant (TRELEGY ELLIPTA) 100-62.5-25 MCG/ACT AEPB Inhale 1 puff into the lungs daily. 180 each 3   levothyroxine (SYNTHROID) 100 MCG tablet TAKE 1 TABLET BY MOUTH EVERY DAY (Patient taking differently: Take 100 mcg by mouth daily. TAKE 1 TABLET BY MOUTH EVERY DAY)  90 tablet 0   Misc. Devices (PULSE OXIMETER) MISC Check oxygen level as needed 1 each 1   pantoprazole (PROTONIX) 40 MG tablet Take 1 tablet (40 mg total) by mouth 2 (two) times daily. 180 tablet 1   venlafaxine (EFFEXOR) 50 MG tablet Take 50 mg by mouth 2 (two) times daily.     vitamin B-12 (CYANOCOBALAMIN) 100 MCG tablet Take 100 mcg by mouth daily.     VITAMIN D PO Take 1 tablet by mouth daily.     nystatin (MYCOSTATIN) 100000 UNIT/ML suspension Take 10 mLs by mouth 4 (four) times daily.     polyethylene glycol (MIRALAX) 17 g packet Take 17 g by mouth daily as needed for mild constipation.     senna-docusate (SENOKOT-S) 8.6-50 MG tablet Take 2 tablets by mouth 2 (two) times daily between meals as needed for moderate constipation.     aspirin 81 MG chewable tablet Chew 1 tablet (81 mg total) by mouth 2 (two) times daily for 28 days. (Patient not taking: Reported on 03/05/2023)     No facility-administered medications prior to visit.    Allergies  Allergen Reactions   Losartan Other (See Comments)    hyperkalemia   Penicillins Hives, Swelling and Rash    Has patient had a PCN reaction causing immediate rash, facial/tongue/throat swelling, SOB or lightheadedness with hypotension: YES Has patient had a PCN reaction causing severe rash involving mucus membranes or skin necrosis: NO Has patient had a PCN reaction that required hospitalization NO Has patient had a PCN reaction occurring within the last 10 years: NO If all of the above answers are "NO", then may proceed with Cephalosporin use.    Cefixime [Kdc:Cefixime] Other (See Comments)    unspecified   Gabapentin Other (See Comments)    Feels woozy   Indocin [Indomethacin] Other (See Comments)    Painful tongue and throat   Singulair [Montelukast Sodium]     Chest tightness, decreased mental clarity   Sulfa Antibiotics Other (See Comments)    unspecified   Ace Inhibitors Other (See Comments)    cough   Statins Other (See Comments)     Myalgias     Review of Systems As per HPI  PE:    03/05/2023    2:53 PM 03/05/2023    2:46 PM 02/16/2023  9:13 PM  Vitals with BMI  Weight  122 lbs 3 oz   BMI  22.35   Systolic 140 148 409  Diastolic 80 72 65  Pulse  64 66    Oxygen sat 91% on 2 L today  Physical Exam  Gen: Alert, well appearing.  Patient is oriented to person, place, time, and situation. Small superficial ulcer on the left mandibular alveolar ridge. Cardiovascular: Regular rhythm and rate without murmur. Lungs are clear to auscultation bilaterally, breathing nonlabored. Extremities: No cyanosis or edema.  No asymmetry.  No tenderness or nodularity or erythema.  LABS:  Last CBC Lab Results  Component Value Date   WBC 6.4 02/14/2023   HGB 11.3 (L) 02/14/2023   HCT 35.9 (L) 02/14/2023   MCV 94.2 02/14/2023   MCH 29.7 02/14/2023   RDW 12.8 02/14/2023   PLT 249 02/14/2023   Lab Results  Component Value Date   IRON 105 09/11/2022   TIBC 392 09/11/2022   FERRITIN 195 09/11/2022   Lab Results  Component Value Date   VITAMINB12 528 09/11/2022   Last metabolic panel Lab Results  Component Value Date   GLUCOSE 106 (H) 02/14/2023   NA 141 02/14/2023   K 3.7 02/14/2023   CL 104 02/14/2023   CO2 28 02/14/2023   BUN 21 02/14/2023   CREATININE 0.65 02/14/2023   GFRNONAA >60 02/14/2023   CALCIUM 9.1 02/14/2023   PHOS 2.9 02/14/2023   PROT 6.1 (L) 10/29/2021   ALBUMIN 3.1 (L) 10/29/2021   BILITOT 0.4 10/29/2021   ALKPHOS 58 10/29/2021   AST 18 10/29/2021   ALT 42 10/29/2021   ANIONGAP 9 02/14/2023   Lab Results  Component Value Date   TSH 1.04 12/25/2022   IMPRESSION AND PLAN:  #1 right total hip revision.  Performed due to recurrent dislocation. She is doing well status post rehab, completely without pain. Home health PT and OT is in process. We will order rolling walker with hand brakes and seat.  Also commode seat with handles.  #2 chronic hypoxic respiratory failure due to  COPD. She is stable.  Continue Trelegy and supplemental oxygen.  3.  Hypertension.  She has occasional orthostatic hypotension symptoms.   Permissive hypertension is her goal, systolic range 120s to 140s. Continue amlodipine 10 mg a day.  #4 gingival ulceration. She will be getting her dentures adjusted. Viscous lidocaine prescribed.  An After Visit Summary was printed and given to the patient.  FOLLOW UP: Return in about 6 weeks (around 04/16/2023) for routine chronic illness f/u.  Signed:  Santiago Bumpers, MD           03/05/2023

## 2023-03-06 ENCOUNTER — Telehealth: Payer: Self-pay

## 2023-03-06 DIAGNOSIS — I129 Hypertensive chronic kidney disease with stage 1 through stage 4 chronic kidney disease, or unspecified chronic kidney disease: Secondary | ICD-10-CM | POA: Diagnosis not present

## 2023-03-06 DIAGNOSIS — J9611 Chronic respiratory failure with hypoxia: Secondary | ICD-10-CM | POA: Diagnosis not present

## 2023-03-06 DIAGNOSIS — M1990 Unspecified osteoarthritis, unspecified site: Secondary | ICD-10-CM | POA: Diagnosis not present

## 2023-03-06 DIAGNOSIS — M48061 Spinal stenosis, lumbar region without neurogenic claudication: Secondary | ICD-10-CM | POA: Diagnosis not present

## 2023-03-06 DIAGNOSIS — I739 Peripheral vascular disease, unspecified: Secondary | ICD-10-CM | POA: Diagnosis not present

## 2023-03-06 DIAGNOSIS — J449 Chronic obstructive pulmonary disease, unspecified: Secondary | ICD-10-CM | POA: Diagnosis not present

## 2023-03-06 DIAGNOSIS — M810 Age-related osteoporosis without current pathological fracture: Secondary | ICD-10-CM | POA: Diagnosis not present

## 2023-03-06 DIAGNOSIS — T84020D Dislocation of internal right hip prosthesis, subsequent encounter: Secondary | ICD-10-CM | POA: Diagnosis not present

## 2023-03-06 DIAGNOSIS — N189 Chronic kidney disease, unspecified: Secondary | ICD-10-CM | POA: Diagnosis not present

## 2023-03-06 NOTE — Telephone Encounter (Signed)
LVM for Priscilla Cantrell to call back.

## 2023-03-06 NOTE — Telephone Encounter (Signed)
Copied and pasted CRM  Reason for CRM: Patient is calling to provider information for company for her medical equipment for special walker.  [facility]  [phone]  Salome Spotted [phone]

## 2023-03-12 DIAGNOSIS — E039 Hypothyroidism, unspecified: Secondary | ICD-10-CM

## 2023-03-12 DIAGNOSIS — M810 Age-related osteoporosis without current pathological fracture: Secondary | ICD-10-CM | POA: Diagnosis not present

## 2023-03-12 DIAGNOSIS — T84020D Dislocation of internal right hip prosthesis, subsequent encounter: Secondary | ICD-10-CM | POA: Diagnosis not present

## 2023-03-12 DIAGNOSIS — J449 Chronic obstructive pulmonary disease, unspecified: Secondary | ICD-10-CM | POA: Diagnosis not present

## 2023-03-12 DIAGNOSIS — M519 Unspecified thoracic, thoracolumbar and lumbosacral intervertebral disc disorder: Secondary | ICD-10-CM

## 2023-03-12 DIAGNOSIS — I129 Hypertensive chronic kidney disease with stage 1 through stage 4 chronic kidney disease, or unspecified chronic kidney disease: Secondary | ICD-10-CM | POA: Diagnosis not present

## 2023-03-12 DIAGNOSIS — G629 Polyneuropathy, unspecified: Secondary | ICD-10-CM

## 2023-03-12 DIAGNOSIS — J9611 Chronic respiratory failure with hypoxia: Secondary | ICD-10-CM | POA: Diagnosis not present

## 2023-03-12 DIAGNOSIS — I739 Peripheral vascular disease, unspecified: Secondary | ICD-10-CM | POA: Diagnosis not present

## 2023-03-12 DIAGNOSIS — M1990 Unspecified osteoarthritis, unspecified site: Secondary | ICD-10-CM | POA: Diagnosis not present

## 2023-03-12 DIAGNOSIS — N189 Chronic kidney disease, unspecified: Secondary | ICD-10-CM | POA: Diagnosis not present

## 2023-03-12 DIAGNOSIS — M48061 Spinal stenosis, lumbar region without neurogenic claudication: Secondary | ICD-10-CM | POA: Diagnosis not present

## 2023-03-12 NOTE — Telephone Encounter (Signed)
 Home health orders received 03/05/23 for Cristel L [facility] health initiation orders: Yes.  Home health re-certification orders: No. Patient last seen by ordering physician for this condition: 03/05/23. Must be less than 90 days for re-certification and less than 30 days prior for initiation. Visit must have been for the condition the orders are being placed.  Patient meets criteria for Physician to sign orders: Yes.        Current med list has been attached: Yes        Orders placed on physicians desk for signature: 03/12/23 (date) If patient does not meet criteria for orders to be signed: pt was called to schedule appt. Appt is scheduled for n/a.   Shanda LELON Sharps

## 2023-03-12 NOTE — Telephone Encounter (Signed)
 Signed and put in box to go up front. Signed:  Arletha Lady, MD           03/12/2023

## 2023-03-19 ENCOUNTER — Telehealth: Payer: Self-pay

## 2023-03-19 DIAGNOSIS — J9611 Chronic respiratory failure with hypoxia: Secondary | ICD-10-CM | POA: Diagnosis not present

## 2023-03-19 DIAGNOSIS — J449 Chronic obstructive pulmonary disease, unspecified: Secondary | ICD-10-CM | POA: Diagnosis not present

## 2023-03-19 DIAGNOSIS — T84020D Dislocation of internal right hip prosthesis, subsequent encounter: Secondary | ICD-10-CM | POA: Diagnosis not present

## 2023-03-19 DIAGNOSIS — N189 Chronic kidney disease, unspecified: Secondary | ICD-10-CM | POA: Diagnosis not present

## 2023-03-19 DIAGNOSIS — I739 Peripheral vascular disease, unspecified: Secondary | ICD-10-CM | POA: Diagnosis not present

## 2023-03-19 DIAGNOSIS — M48061 Spinal stenosis, lumbar region without neurogenic claudication: Secondary | ICD-10-CM | POA: Diagnosis not present

## 2023-03-19 DIAGNOSIS — I129 Hypertensive chronic kidney disease with stage 1 through stage 4 chronic kidney disease, or unspecified chronic kidney disease: Secondary | ICD-10-CM | POA: Diagnosis not present

## 2023-03-19 DIAGNOSIS — M1990 Unspecified osteoarthritis, unspecified site: Secondary | ICD-10-CM | POA: Diagnosis not present

## 2023-03-19 DIAGNOSIS — M810 Age-related osteoporosis without current pathological fracture: Secondary | ICD-10-CM | POA: Diagnosis not present

## 2023-03-19 DIAGNOSIS — R2681 Unsteadiness on feet: Secondary | ICD-10-CM

## 2023-03-19 DIAGNOSIS — R5381 Other malaise: Secondary | ICD-10-CM

## 2023-03-19 NOTE — Telephone Encounter (Signed)
Yes these are fine.  Can you do the prescriptions?

## 2023-03-19 NOTE — Telephone Encounter (Signed)
Written DME prescriptions printed and will be mailed out to patient's home.

## 2023-03-19 NOTE — Telephone Encounter (Signed)
Please review and advise on request for written rx

## 2023-03-19 NOTE — Addendum Note (Signed)
Addended by: Emi Holes D on: 03/19/2023 02:18 PM   Modules accepted: Orders

## 2023-03-19 NOTE — Telephone Encounter (Signed)
Attempt to follow up with Pt on PAP. APP was mailed on 02/07/23. Left v/m for return call. Have not received Pro. Portion either.

## 2023-03-19 NOTE — Telephone Encounter (Signed)
Copied from CRM [id number]. Topic: Clinical - Home Health Verbal Orders >> Mar 19, 2023 11:12 AM Dennison Nancy wrote: Caller/Agency: Dava at [facility] Number: [phone] Service Requested: Dava at [facility] occupational therapist and patient called checking on status of getting a bedside commode sits over the toliet, 4 wheel wheelchair  Rollater walker with a seat  Can send to prescription order to patient house : [phone] [facility] [phone] Patient husband will be picking it up from the medical supply place himself     Frequency: N/A Any new concerns about the patient? No

## 2023-03-30 DIAGNOSIS — M48061 Spinal stenosis, lumbar region without neurogenic claudication: Secondary | ICD-10-CM | POA: Diagnosis not present

## 2023-03-30 DIAGNOSIS — N189 Chronic kidney disease, unspecified: Secondary | ICD-10-CM | POA: Diagnosis not present

## 2023-03-30 DIAGNOSIS — M1990 Unspecified osteoarthritis, unspecified site: Secondary | ICD-10-CM | POA: Diagnosis not present

## 2023-03-30 DIAGNOSIS — I739 Peripheral vascular disease, unspecified: Secondary | ICD-10-CM | POA: Diagnosis not present

## 2023-03-30 DIAGNOSIS — I129 Hypertensive chronic kidney disease with stage 1 through stage 4 chronic kidney disease, or unspecified chronic kidney disease: Secondary | ICD-10-CM | POA: Diagnosis not present

## 2023-03-30 DIAGNOSIS — M810 Age-related osteoporosis without current pathological fracture: Secondary | ICD-10-CM | POA: Diagnosis not present

## 2023-03-30 DIAGNOSIS — J449 Chronic obstructive pulmonary disease, unspecified: Secondary | ICD-10-CM | POA: Diagnosis not present

## 2023-03-30 DIAGNOSIS — T84020D Dislocation of internal right hip prosthesis, subsequent encounter: Secondary | ICD-10-CM | POA: Diagnosis not present

## 2023-03-30 DIAGNOSIS — J9611 Chronic respiratory failure with hypoxia: Secondary | ICD-10-CM | POA: Diagnosis not present

## 2023-03-31 DIAGNOSIS — H02054 Trichiasis without entropian left upper eyelid: Secondary | ICD-10-CM | POA: Diagnosis not present

## 2023-03-31 DIAGNOSIS — H532 Diplopia: Secondary | ICD-10-CM | POA: Diagnosis not present

## 2023-03-31 DIAGNOSIS — H26493 Other secondary cataract, bilateral: Secondary | ICD-10-CM | POA: Diagnosis not present

## 2023-03-31 DIAGNOSIS — H35033 Hypertensive retinopathy, bilateral: Secondary | ICD-10-CM | POA: Diagnosis not present

## 2023-04-01 DIAGNOSIS — I129 Hypertensive chronic kidney disease with stage 1 through stage 4 chronic kidney disease, or unspecified chronic kidney disease: Secondary | ICD-10-CM | POA: Diagnosis not present

## 2023-04-01 DIAGNOSIS — M1990 Unspecified osteoarthritis, unspecified site: Secondary | ICD-10-CM | POA: Diagnosis not present

## 2023-04-01 DIAGNOSIS — N189 Chronic kidney disease, unspecified: Secondary | ICD-10-CM | POA: Diagnosis not present

## 2023-04-01 DIAGNOSIS — J9611 Chronic respiratory failure with hypoxia: Secondary | ICD-10-CM | POA: Diagnosis not present

## 2023-04-01 DIAGNOSIS — T84020D Dislocation of internal right hip prosthesis, subsequent encounter: Secondary | ICD-10-CM | POA: Diagnosis not present

## 2023-04-01 DIAGNOSIS — I739 Peripheral vascular disease, unspecified: Secondary | ICD-10-CM | POA: Diagnosis not present

## 2023-04-01 DIAGNOSIS — M810 Age-related osteoporosis without current pathological fracture: Secondary | ICD-10-CM | POA: Diagnosis not present

## 2023-04-01 DIAGNOSIS — J449 Chronic obstructive pulmonary disease, unspecified: Secondary | ICD-10-CM | POA: Diagnosis not present

## 2023-04-01 DIAGNOSIS — M48061 Spinal stenosis, lumbar region without neurogenic claudication: Secondary | ICD-10-CM | POA: Diagnosis not present

## 2023-04-02 NOTE — Telephone Encounter (Signed)
 Signed and put in box to go up front. Signed:  Santiago Bumpers, MD           04/02/2023

## 2023-04-02 NOTE — Telephone Encounter (Signed)
 Home health orders received 04/02/23 for Priscilla Cantrell [facility] health initiation orders: Yes.  Home health re-certification orders: No. Patient last seen by ordering physician for this condition: 03/05/23. Must be less than 90 days for re-certification and less than 30 days prior for initiation. Visit must have been for the condition the orders are being placed.  Patient meets criteria for Physician to sign orders: Yes.        Current med list has been attached: Yes        Orders placed on physicians desk for signature: 04/02/23 (date) If patient does not meet criteria for orders to be signed: pt was called to schedule appt. Appt is scheduled for n/a.    [name]

## 2023-04-08 NOTE — Telephone Encounter (Signed)
 Multiple attempts have been made to reach Patient by telephone and with Epic- no return of application or communication. Sent to [facility] for review.

## 2023-04-09 DIAGNOSIS — M1990 Unspecified osteoarthritis, unspecified site: Secondary | ICD-10-CM | POA: Diagnosis not present

## 2023-04-09 DIAGNOSIS — I739 Peripheral vascular disease, unspecified: Secondary | ICD-10-CM | POA: Diagnosis not present

## 2023-04-09 DIAGNOSIS — M48061 Spinal stenosis, lumbar region without neurogenic claudication: Secondary | ICD-10-CM | POA: Diagnosis not present

## 2023-04-09 DIAGNOSIS — I129 Hypertensive chronic kidney disease with stage 1 through stage 4 chronic kidney disease, or unspecified chronic kidney disease: Secondary | ICD-10-CM | POA: Diagnosis not present

## 2023-04-09 DIAGNOSIS — M810 Age-related osteoporosis without current pathological fracture: Secondary | ICD-10-CM | POA: Diagnosis not present

## 2023-04-09 DIAGNOSIS — T84020D Dislocation of internal right hip prosthesis, subsequent encounter: Secondary | ICD-10-CM | POA: Diagnosis not present

## 2023-04-09 DIAGNOSIS — N189 Chronic kidney disease, unspecified: Secondary | ICD-10-CM | POA: Diagnosis not present

## 2023-04-09 DIAGNOSIS — J9611 Chronic respiratory failure with hypoxia: Secondary | ICD-10-CM | POA: Diagnosis not present

## 2023-04-09 DIAGNOSIS — J449 Chronic obstructive pulmonary disease, unspecified: Secondary | ICD-10-CM | POA: Diagnosis not present

## 2023-04-13 DIAGNOSIS — Z471 Aftercare following joint replacement surgery: Secondary | ICD-10-CM | POA: Diagnosis not present

## 2023-04-13 DIAGNOSIS — Z96641 Presence of right artificial hip joint: Secondary | ICD-10-CM | POA: Diagnosis not present

## 2023-04-14 ENCOUNTER — Other Ambulatory Visit: Payer: Self-pay

## 2023-04-14 DIAGNOSIS — S8012XA Contusion of left lower leg, initial encounter: Secondary | ICD-10-CM | POA: Diagnosis not present

## 2023-04-14 MED ORDER — LEVOTHYROXINE SODIUM 100 MCG PO TABS: ORAL_TABLET | ORAL | 0 refills | Status: DC

## 2023-04-14 MED ORDER — AMLODIPINE BESYLATE 10 MG PO TABS: 10.0000 mg | ORAL_TABLET | Freq: Every day | ORAL | 1 refills | Status: DC

## 2023-04-15 ENCOUNTER — Telehealth: Payer: Self-pay

## 2023-04-15 DIAGNOSIS — S8012XD Contusion of left lower leg, subsequent encounter: Secondary | ICD-10-CM | POA: Diagnosis not present

## 2023-04-15 DIAGNOSIS — I739 Peripheral vascular disease, unspecified: Secondary | ICD-10-CM | POA: Diagnosis not present

## 2023-04-15 DIAGNOSIS — J449 Chronic obstructive pulmonary disease, unspecified: Secondary | ICD-10-CM | POA: Diagnosis not present

## 2023-04-15 DIAGNOSIS — T84020D Dislocation of internal right hip prosthesis, subsequent encounter: Secondary | ICD-10-CM | POA: Diagnosis not present

## 2023-04-15 DIAGNOSIS — M48061 Spinal stenosis, lumbar region without neurogenic claudication: Secondary | ICD-10-CM | POA: Diagnosis not present

## 2023-04-15 DIAGNOSIS — J9611 Chronic respiratory failure with hypoxia: Secondary | ICD-10-CM | POA: Diagnosis not present

## 2023-04-15 DIAGNOSIS — M1990 Unspecified osteoarthritis, unspecified site: Secondary | ICD-10-CM | POA: Diagnosis not present

## 2023-04-15 DIAGNOSIS — N189 Chronic kidney disease, unspecified: Secondary | ICD-10-CM | POA: Diagnosis not present

## 2023-04-15 DIAGNOSIS — M810 Age-related osteoporosis without current pathological fracture: Secondary | ICD-10-CM | POA: Diagnosis not present

## 2023-04-15 DIAGNOSIS — I129 Hypertensive chronic kidney disease with stage 1 through stage 4 chronic kidney disease, or unspecified chronic kidney disease: Secondary | ICD-10-CM | POA: Diagnosis not present

## 2023-04-15 NOTE — Telephone Encounter (Signed)
 Copied from CRM [id number]. Topic: Clinical - Home Health Verbal Orders >> Apr 15, 2023 10:39 AM Isabell A wrote: Caller/Agency: [facility] Callback Number: [phone]  Service Requested: Occupational Therapy Frequency: N/A Any new concerns about the patient? Yes. STAT Order for doppler imaging of the left lower leg to make sure there isn't a blood clot - patient fell on 2/28, landed on her shin on brick steps in her garage.    Also, a nursing evaluation for the wound on her right arm and monitor the swelling on left leg.

## 2023-04-15 NOTE — Telephone Encounter (Signed)
 LM for HH to return call to discuss. Pt should be evaluated in ED if having concern or acute appt scheduled. PCP is out of the office until Monday

## 2023-04-16 ENCOUNTER — Telehealth: Payer: Self-pay

## 2023-04-16 NOTE — Telephone Encounter (Signed)
 Received fax from [facility] requesting dynamic mobile imaging for left leg doppler.  Placed on PCP desk to review and sign, if appropriate upon return  on 3/17.

## 2023-04-17 DIAGNOSIS — R2242 Localized swelling, mass and lump, left lower limb: Secondary | ICD-10-CM | POA: Diagnosis not present

## 2023-04-17 NOTE — Patient Instructions (Addendum)
 Make sure you are not taking any alprazolam.  I sent in prednisone tabs for you to start taking if you feel a flare up of your emphysema starting.

## 2023-04-19 DIAGNOSIS — I129 Hypertensive chronic kidney disease with stage 1 through stage 4 chronic kidney disease, or unspecified chronic kidney disease: Secondary | ICD-10-CM | POA: Diagnosis not present

## 2023-04-19 DIAGNOSIS — J9611 Chronic respiratory failure with hypoxia: Secondary | ICD-10-CM | POA: Diagnosis not present

## 2023-04-19 DIAGNOSIS — M48061 Spinal stenosis, lumbar region without neurogenic claudication: Secondary | ICD-10-CM | POA: Diagnosis not present

## 2023-04-19 DIAGNOSIS — M1990 Unspecified osteoarthritis, unspecified site: Secondary | ICD-10-CM | POA: Diagnosis not present

## 2023-04-19 DIAGNOSIS — M810 Age-related osteoporosis without current pathological fracture: Secondary | ICD-10-CM | POA: Diagnosis not present

## 2023-04-19 DIAGNOSIS — T84020D Dislocation of internal right hip prosthesis, subsequent encounter: Secondary | ICD-10-CM | POA: Diagnosis not present

## 2023-04-19 DIAGNOSIS — I739 Peripheral vascular disease, unspecified: Secondary | ICD-10-CM | POA: Diagnosis not present

## 2023-04-19 DIAGNOSIS — N189 Chronic kidney disease, unspecified: Secondary | ICD-10-CM | POA: Diagnosis not present

## 2023-04-19 DIAGNOSIS — J449 Chronic obstructive pulmonary disease, unspecified: Secondary | ICD-10-CM | POA: Diagnosis not present

## 2023-04-21 DIAGNOSIS — T84020D Dislocation of internal right hip prosthesis, subsequent encounter: Secondary | ICD-10-CM | POA: Diagnosis not present

## 2023-04-21 DIAGNOSIS — N189 Chronic kidney disease, unspecified: Secondary | ICD-10-CM | POA: Diagnosis not present

## 2023-04-21 DIAGNOSIS — J9611 Chronic respiratory failure with hypoxia: Secondary | ICD-10-CM | POA: Diagnosis not present

## 2023-04-21 DIAGNOSIS — M810 Age-related osteoporosis without current pathological fracture: Secondary | ICD-10-CM | POA: Diagnosis not present

## 2023-04-21 DIAGNOSIS — I129 Hypertensive chronic kidney disease with stage 1 through stage 4 chronic kidney disease, or unspecified chronic kidney disease: Secondary | ICD-10-CM | POA: Diagnosis not present

## 2023-04-21 DIAGNOSIS — I739 Peripheral vascular disease, unspecified: Secondary | ICD-10-CM | POA: Diagnosis not present

## 2023-04-21 DIAGNOSIS — M1990 Unspecified osteoarthritis, unspecified site: Secondary | ICD-10-CM | POA: Diagnosis not present

## 2023-04-21 DIAGNOSIS — J449 Chronic obstructive pulmonary disease, unspecified: Secondary | ICD-10-CM | POA: Diagnosis not present

## 2023-04-21 DIAGNOSIS — M48061 Spinal stenosis, lumbar region without neurogenic claudication: Secondary | ICD-10-CM | POA: Diagnosis not present

## 2023-04-22 ENCOUNTER — Encounter: Payer: Self-pay | Admitting: Family Medicine

## 2023-04-22 ENCOUNTER — Ambulatory Visit: Payer: Medicare HMO | Admitting: Family Medicine

## 2023-04-22 VITALS — BP 128/75 | HR 64 | Temp 97.8°F | Wt 120.4 lb

## 2023-04-22 DIAGNOSIS — I10 Essential (primary) hypertension: Secondary | ICD-10-CM | POA: Diagnosis not present

## 2023-04-22 DIAGNOSIS — J449 Chronic obstructive pulmonary disease, unspecified: Secondary | ICD-10-CM | POA: Diagnosis not present

## 2023-04-22 DIAGNOSIS — J9611 Chronic respiratory failure with hypoxia: Secondary | ICD-10-CM | POA: Diagnosis not present

## 2023-04-22 DIAGNOSIS — S8012XD Contusion of left lower leg, subsequent encounter: Secondary | ICD-10-CM | POA: Diagnosis not present

## 2023-04-22 MED ORDER — PANTOPRAZOLE SODIUM 40 MG PO TBEC: 40.0000 mg | DELAYED_RELEASE_TABLET | Freq: Two times a day (BID) | ORAL | 1 refills | Status: DC

## 2023-04-22 MED ORDER — PREDNISONE 20 MG PO TABS: ORAL_TABLET | ORAL | 0 refills | Status: DC

## 2023-04-22 NOTE — Progress Notes (Signed)
 OFFICE VISIT  04/22/2023  CC:  Chief Complaint  Patient presents with   Hypertension    Patient is a 88 y.o. female who presents for 6-week follow-up hypertension and chronic hypoxic respiratory failure  INTERIM HX: Priscilla Cantrell is feeling pretty well, but just started a cough yesterday, just a little gravelly phlegm in the back of her throat.  Did not feel any wheezing or shortness of breath at this time.  No fever. She does not feel like she is currently starting a COPD exacerbation. She takes Trelegy Ellipta 1 puff daily.  On 04/11/2023 she had a fall from standing when she hit her foot on a step.  She hurt her left lower leg.  Went to urgent care, x-ray negative.  The leg has been a little bit swollen.  Home health nursing did a venous ultrasound at home and patient tells me that it showed no clot.  The swelling has gone down some.  She has no numbness or tingling in her leg and she has no weakness in the leg.  No visible bruising.  No home blood pressure monitoring.  She is taking amlodipine 10 mg a day.  PMP AWARE reviewed today: most recent rx for alprazolam 0.5 mg was filled 01/23/2023, # 30, rx by Cleora Fleet. No red flags.  Review of systems: No headaches, no dizziness, no palpitations, no chest pain.  Past Medical History:  Diagnosis Date   Allergic rhinitis    with upper airway cough: as of 07/2017 pulm f/u she was instructed to use astelin, flonase, and saline more consistently   BPPV (benign paroxysmal positional vertigo) 03/2018   C. difficile colitis    Chest pain, non-cardiac    Cardiac CT showed no signif obstructive dz, mildly elevated calcium level (Dr. Jens Som).   Chronic cystitis with hematuria    Dr. Lenon Curt   Chronic hypoxemic respiratory failure (HCC) 02/02/2015   2L oxygen 24/7   Chronic renal insufficiency, stage 2 (mild)    GFR approx 60   Cold hands    NCS/EMGs normal.  Hand dysesthesias per neuro--reassured   COPD (chronic obstructive  pulmonary disease) (HCC)    GOLD 4.  spirometry 01/10/09 FEV 0.97(52%), FEV1% 47.  With chronic bronchitis as of 07/2017.   DDD (degenerative disc disease), cervical    1998 MRI C5-6 impingemt (left).  MRI 2021 (neurol) 1 level canal stenosis, 1 level foraminal stenosis--->PT   DDD (degenerative disc disease), lumbar    Depression with anxiety 04/15/2011   Diarrheal disease Summer 2017   ? 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 Dr Kinnie Scales).  As of 10/05/15 pt reports GI dx'd her with C diff and rx'd more flagyl and she is also on cholestyramine.  As of 02/2016, pt's sx's resolved completely.   Diverticulosis of colon    Procto '97 and colonoscopy 2002   Dysplastic nevus of upper extremity 04/2014   R tricep (Dr. Jorja Loa)   Edema of both lower extremities due to peripheral venous insufficiency    Varicosities bilat, swelling L>R.  R baker's cyst.  Got vein clinic eval 02/2018->sclerotherapy recommended but since no hemorrhage or ulceration, insurance will not cover the procedure.   Family history of adverse reaction to anesthesia    mother died when patient age 71 receiving anesthesia for thyroid surgery   Fibrocystic breast disease    w/fibroadenoma.  Bx's showed NO atypia (Dr. Jamey Ripa).  Mammo neg 2008.   GERD (  gastroesophageal reflux disease)    History of double vision    Ophthalmologist, Dr. Elmer Picker, is further evaluating this with MRI  orbits and limited brain (myesthenia gravis testing neg 07/2010)   History of home oxygen therapy    uses 2 liters at hs   Hypertension    EKG 03/2010 normal   Hypothyroidism    Hashimoto's, dx'd 1989   Idiopathic peripheral neuropathy 04/2018   Sensory (numb feet) S1 distribution bilat, c/w spinal stenosis sensory neuropathy (no pain). 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  Labs [place], MRI C spine->some  spondylosis/stenosis.  UE NCS/EMS: normal 04/2019.   Lesion of right native kidney 2017   found on CT 02/2015.  F/u MRI 03/31/15 showed it to be smaller and less likely of any significance but repeat CT renal protocol in 45mo was recommended by radiology.  This was done 10/26/15 and showed no interval change in the lesion, but b/c the lesion enhances with contrast, radiol rec'd urol consult (b/c pap renal RCC could not be excluded).  Saw urol 03/06/16--stable on u/s 09/2016 and 09/2017.   OAB (overactive bladder)    Osteoarthritis, hip, bilateral    she is s/p bilat THA as of 02/2018.   Osteoporosis    radius, T score -2.5->rec'd alendronate 08/2019->plan rpt DEXA 08/2021.   Pneumonia    Postmenopausal atrophic vaginitis    improved with estrace 2 X/week.   Prolapse of vaginal cuff after hysterectomy    10/2018: Dr. Katherina Right. Mild colpocele 04/2019 per GYN (no surgery)   Pulmonary nodule    CT chest 12/10, June 2011.  No nodule on CT 06/2010.   Recurrent Clostridium difficile diarrhea    Episode 09/19/2016: resolved with prolonged course of flagyl.  11/2016 recurrence treated with 10d of Dificid (Fidaxomicin) by GI (Dr. Kinnie Scales).   Recurrent UTI    likely related to pt's atrophic vaginitis.  Urol started vaginal estrace 03/2016.   Rib fracture 09/2018   R 7th, laterally-->sustained when her dog pulled her over and she fell.   RLS (restless legs syndrome)    neuro->gabapentin trial 2020/21   Shingles 02/2014   R abd/flank   Spinal stenosis of lumbar region with neurogenic claudication    2008 MRI (L3-4, L4-5, L5-S1).  +S1 distribution sensory loss bilat    Past Surgical History:  Procedure Laterality Date   ABDOMINAL HYSTERECTOMY  1978   no BSO per pt--nonmalignant reasons   APPENDECTOMY     BREAST CYST EXCISION     Last screening mammogram 10/2010 was normal.   CHOLECYSTECTOMY  2001   COLONOSCOPY  04/2005; 12/06/2015   2007 NORMAL.  2017 adenomatous polyp x 1.  Mod sigmoid diverticulosis (Dr.  Kinnie Scales).     DEXA  05/18/2017; 08/7019   2019 NORMAL (T-score 0.1).  08/2019->T score -2.5->alendronate started.  03/2022 T score -2.7   HIP CLOSED REDUCTION Right 12/25/2015   Procedure: CLOSED REDUCTION HIP;  Surgeon: Yolonda Kida, MD;  Location: [facility];  Service: Orthopedics;  Laterality: Right;   NCV/EMS  10/2018   chronic and symmetric sensorimotor axonal polyneuropathy affecting the lower extremities   TONSILLECTOMY     TOTAL HIP ARTHROPLASTY Right 11/01/2014   Procedure: RIGHT TOTAL HIP ARTHROPLASTY;  Surgeon: Ranee Gosselin, MD;  Location: [facility];  Service: Orthopedics;  Laterality: Right;   TOTAL HIP ARTHROPLASTY Left 12/08/2017   Procedure: LEFT TOTAL HIP ARTHROPLASTY ANTERIOR APPROACH;  Surgeon: Durene Romans, MD;  Location: [facility];  Service: Orthopedics;  Laterality: Left;  70 mins   TOTAL HIP REVISION Right 02/10/2023   Procedure: RIGHT TOTAL HIP REVISION;  Surgeon: Durene Romans, MD;  Location: [facility];  Service: Orthopedics;  Laterality: Right;   TRANSTHORACIC ECHOCARDIOGRAM  08/2014   2016 EF 60-65%, grade I diast dysfxn. 06/21/21 unchanged.   TUBAL LIGATION  1974    Outpatient Medications Prior to Visit  Medication Sig Dispense Refill   acetaminophen (TYLENOL) 325 MG tablet Take 2 tablets (650 mg total) by mouth every 6 (six) hours.     albuterol (PROAIR HFA) 108 (90 Base) MCG/ACT inhaler Inhale 2 puffs into the lungs every 4 (four) hours as needed for wheezing or shortness of breath. 17 each 1   amLODipine (NORVASC) 10 MG tablet Take 1 tablet (10 mg total) by mouth daily. 30 tablet 1   fluticasone (FLONASE) 50 MCG/ACT nasal spray Place 2 sprays into both nostrils daily. 16 g 6   Fluticasone-Umeclidin-Vilant (TRELEGY ELLIPTA) 100-62.5-25 MCG/ACT AEPB Inhale 1 puff into the lungs daily. 180 each 3   levothyroxine (SYNTHROID) 100 MCG tablet TAKE 1 TABLET BY MOUTH EVERY DAY 90 tablet 0   lidocaine (XYLOCAINE) 2 % solution Apply 1/2 ml to affected area of mouth 3 times per  day as needed for pain 20 mL 2   Misc. Devices (PULSE OXIMETER) MISC Check oxygen level as needed 1 each 1   pantoprazole (PROTONIX) 40 MG tablet Take 1 tablet (40 mg total) by mouth 2 (two) times daily. 180 tablet 1   venlafaxine (EFFEXOR) 50 MG tablet Take 50 mg by mouth 2 (two) times daily.     vitamin B-12 (CYANOCOBALAMIN) 100 MCG tablet Take 100 mcg by mouth daily.     VITAMIN D PO Take 1 tablet by mouth daily.     No facility-administered medications prior to visit.    Allergies  Allergen Reactions   Losartan Other (See Comments)    hyperkalemia   Penicillins Hives, Swelling and Rash    Has patient had a PCN reaction causing immediate rash, facial/tongue/throat swelling, SOB or lightheadedness with hypotension: YES Has patient had a PCN reaction causing severe rash involving mucus membranes or skin necrosis: NO Has patient had a PCN reaction that required hospitalization NO Has patient had a PCN reaction occurring within the last 10 years: NO If all of the above answers are "NO", then may proceed with Cephalosporin use.    Cefixime [Kdc:Cefixime] Other (See Comments)    unspecified   Gabapentin Other (See Comments)    Feels woozy   Indocin [Indomethacin] Other (See Comments)    Painful tongue and throat   Singulair [Montelukast Sodium]     Chest tightness, decreased mental clarity   Sulfa Antibiotics Other (See Comments)    unspecified   Ace Inhibitors Other (See Comments)    cough   Statins Other (See Comments)    Myalgias     Review of Systems As per HPI  PE:    04/22/2023    3:01 PM 03/05/2023    2:53 PM 03/05/2023    2:46 PM  Vitals with BMI  Weight 120 lbs 6 oz  122 lbs 3 oz  BMI   22.35  Systolic 128 140 295  Diastolic 75 80 72  Pulse 64  64     Physical Exam  Gen: Alert, well appearing.  Patient is oriented to person, place, time, and situation. AFFECT: pleasant, lucid thought and speech. CV: RRR, no murmur Lungs are clear bilaterally, mild  prolongation of  expiratory phase.  Breathing is nonlabored. Extremities: No pitting edema.  She has mild swelling of the left lower leg but no significant erythema or warmth.  She has a little bit of tenderness to palpation on the proximal aspect laterally.  No bruising, no fluctuance. Left calf circumference at 15 cm below the inferior border the patella is 32 cm.  Right side 31 cm. Left ankle and foot strength and range of motion normal.  Sensation normal.  Dorsalis pedis pulse palpable.  LABS:  Last CBC Lab Results  Component Value Date   WBC 6.4 02/14/2023   HGB 11.3 (L) 02/14/2023   HCT 35.9 (L) 02/14/2023   MCV 94.2 02/14/2023   MCH 29.7 02/14/2023   RDW 12.8 02/14/2023   PLT 249 02/14/2023   Lab Results  Component Value Date   IRON 105 09/11/2022   TIBC 392 09/11/2022   FERRITIN 195 09/11/2022   Last metabolic panel Lab Results  Component Value Date   GLUCOSE 106 (H) 02/14/2023   NA 141 02/14/2023   K 3.7 02/14/2023   CL 104 02/14/2023   CO2 28 02/14/2023   BUN 21 02/14/2023   CREATININE 0.65 02/14/2023   GFRNONAA >60 02/14/2023   CALCIUM 9.1 02/14/2023   PHOS 2.9 02/14/2023   PROT 6.1 (L) 10/29/2021   ALBUMIN 3.1 (L) 10/29/2021   BILITOT 0.4 10/29/2021   ALKPHOS 58 10/29/2021   AST 18 10/29/2021   ALT 42 10/29/2021   ANIONGAP 9 02/14/2023   Last lipids Lab Results  Component Value Date   CHOL 198 12/07/2020   HDL 66 12/07/2020   LDLCALC 113 (H) 12/07/2020   LDLDIRECT 131.3 03/07/2010   TRIG 88 12/07/2020   CHOLHDL 3.0 12/07/2020   Last hemoglobin A1c Lab Results  Component Value Date   HGBA1C 5.7 10/29/2011   Last thyroid functions Lab Results  Component Value Date   TSH 1.04 12/25/2022   T3TOTAL 90 12/25/2022   T4TOTAL 12.4 07/11/2010   IMPRESSION AND PLAN:  #1 left lower leg contusion.  Gradually improving.  Apply ice.  RN.  2.  Hypertension, well-controlled on amlodipine 10 mg a day.  3.  COPD/chronic hypoxic respiratory failure.  She  is doing well currently. Continue 24/7 oxygen 2 to 3 L nasal cannula.  Also continue Trelegy Ellipta 1 puff daily and she has albuterol to use every 6 hours as needed. I did prescribe a small amount of prednisone 20 mg tabs to start at home when she detects the beginning of a COPD exacerbation.  She is to call me and make an appointment at that time.   An After Visit Summary was printed and given to the patient.  FOLLOW UP: No follow-ups on file.  Signed:  Santiago Bumpers, MD           04/22/2023

## 2023-04-23 ENCOUNTER — Telehealth: Payer: Self-pay

## 2023-04-23 DIAGNOSIS — I739 Peripheral vascular disease, unspecified: Secondary | ICD-10-CM | POA: Diagnosis not present

## 2023-04-23 DIAGNOSIS — J9611 Chronic respiratory failure with hypoxia: Secondary | ICD-10-CM | POA: Diagnosis not present

## 2023-04-23 DIAGNOSIS — J449 Chronic obstructive pulmonary disease, unspecified: Secondary | ICD-10-CM | POA: Diagnosis not present

## 2023-04-23 DIAGNOSIS — M48061 Spinal stenosis, lumbar region without neurogenic claudication: Secondary | ICD-10-CM | POA: Diagnosis not present

## 2023-04-23 DIAGNOSIS — I129 Hypertensive chronic kidney disease with stage 1 through stage 4 chronic kidney disease, or unspecified chronic kidney disease: Secondary | ICD-10-CM | POA: Diagnosis not present

## 2023-04-23 DIAGNOSIS — N189 Chronic kidney disease, unspecified: Secondary | ICD-10-CM | POA: Diagnosis not present

## 2023-04-23 DIAGNOSIS — T84020D Dislocation of internal right hip prosthesis, subsequent encounter: Secondary | ICD-10-CM | POA: Diagnosis not present

## 2023-04-23 DIAGNOSIS — M810 Age-related osteoporosis without current pathological fracture: Secondary | ICD-10-CM | POA: Diagnosis not present

## 2023-04-23 DIAGNOSIS — M1990 Unspecified osteoarthritis, unspecified site: Secondary | ICD-10-CM | POA: Diagnosis not present

## 2023-04-23 NOTE — Telephone Encounter (Signed)
 Home health orders received 04/23/23 for [facility] health initiation orders: Yes.  Home health re-certification orders: No. Patient last seen by ordering physician for this condition: 04/23/23. Must be less than 90 days for re-certification and less than 30 days prior for initiation. Visit must have been for the condition the orders are being placed.  Patient meets criteria for Physician to sign orders: Yes.        Current med list has been attached: No        Orders placed on physicians desk for signature: 04/23/23 (date) If patient does not meet criteria for orders to be signed: pt was called to schedule appt. Appt is scheduled for 07/22/23.    Placed on PCP desk to review and sign, if appropriate.  Pollie Meyer

## 2023-04-24 DIAGNOSIS — I739 Peripheral vascular disease, unspecified: Secondary | ICD-10-CM | POA: Diagnosis not present

## 2023-04-24 DIAGNOSIS — M48061 Spinal stenosis, lumbar region without neurogenic claudication: Secondary | ICD-10-CM | POA: Diagnosis not present

## 2023-04-24 DIAGNOSIS — M810 Age-related osteoporosis without current pathological fracture: Secondary | ICD-10-CM | POA: Diagnosis not present

## 2023-04-24 DIAGNOSIS — J9611 Chronic respiratory failure with hypoxia: Secondary | ICD-10-CM | POA: Diagnosis not present

## 2023-04-24 DIAGNOSIS — J449 Chronic obstructive pulmonary disease, unspecified: Secondary | ICD-10-CM | POA: Diagnosis not present

## 2023-04-24 DIAGNOSIS — M1990 Unspecified osteoarthritis, unspecified site: Secondary | ICD-10-CM | POA: Diagnosis not present

## 2023-04-24 DIAGNOSIS — I129 Hypertensive chronic kidney disease with stage 1 through stage 4 chronic kidney disease, or unspecified chronic kidney disease: Secondary | ICD-10-CM | POA: Diagnosis not present

## 2023-04-24 DIAGNOSIS — N189 Chronic kidney disease, unspecified: Secondary | ICD-10-CM | POA: Diagnosis not present

## 2023-04-24 DIAGNOSIS — T84020D Dislocation of internal right hip prosthesis, subsequent encounter: Secondary | ICD-10-CM | POA: Diagnosis not present

## 2023-04-24 NOTE — Telephone Encounter (Signed)
 Signed and put in box to go up front. Signed:  Santiago Bumpers, MD           04/24/2023

## 2023-04-24 NOTE — Telephone Encounter (Signed)
 Home health orders received 04/24/23 for Priscilla Cantrell health initiation orders: Yes.  Home health re-certification orders: No. Patient last seen by ordering physician for this condition: 04/23/23. Must be less than 90 days for re-certification and less than 30 days prior for initiation. Visit must have been for the condition the orders are being placed.  Patient meets criteria for Physician to sign orders: Yes.        Current med list has been attached: No        Orders placed on physicians desk for signature: 04/24/23 (date) If patient does not meet criteria for orders to be signed: pt was called to schedule appt. Appt is scheduled for 07/22/23.

## 2023-04-27 ENCOUNTER — Telehealth: Payer: Self-pay

## 2023-04-27 NOTE — Telephone Encounter (Signed)
 Forms signed and faxed back.

## 2023-04-27 NOTE — Telephone Encounter (Signed)
 Home health orders received 04/27/23 for [facility] health initiation orders: Yes.  Home health re-certification orders: No. Patient last seen by ordering physician for this condition: 04/22/23. Must be less than 90 days for re-certification and less than 30 days prior for initiation. Visit must have been for the condition the orders are being placed.  Patient meets criteria for Physician to sign orders: Yes.        Current med list has been attached: No        Orders placed on physicians desk for signature: 04/27/23 (date) If patient does not meet criteria for orders to be signed: pt was called to schedule appt. Appt is scheduled for 07/22/23.   Priscilla Cantrell Priscilla Cantrell Priscilla Cantrell  Placed on PCP desk to review and sign, if appropriate.

## 2023-04-28 DIAGNOSIS — I739 Peripheral vascular disease, unspecified: Secondary | ICD-10-CM | POA: Diagnosis not present

## 2023-04-28 DIAGNOSIS — N189 Chronic kidney disease, unspecified: Secondary | ICD-10-CM | POA: Diagnosis not present

## 2023-04-28 DIAGNOSIS — J9611 Chronic respiratory failure with hypoxia: Secondary | ICD-10-CM | POA: Diagnosis not present

## 2023-04-28 DIAGNOSIS — M48061 Spinal stenosis, lumbar region without neurogenic claudication: Secondary | ICD-10-CM | POA: Diagnosis not present

## 2023-04-28 DIAGNOSIS — J449 Chronic obstructive pulmonary disease, unspecified: Secondary | ICD-10-CM | POA: Diagnosis not present

## 2023-04-28 DIAGNOSIS — M1990 Unspecified osteoarthritis, unspecified site: Secondary | ICD-10-CM | POA: Diagnosis not present

## 2023-04-28 DIAGNOSIS — T84020D Dislocation of internal right hip prosthesis, subsequent encounter: Secondary | ICD-10-CM | POA: Diagnosis not present

## 2023-04-28 DIAGNOSIS — I129 Hypertensive chronic kidney disease with stage 1 through stage 4 chronic kidney disease, or unspecified chronic kidney disease: Secondary | ICD-10-CM | POA: Diagnosis not present

## 2023-04-28 DIAGNOSIS — M810 Age-related osteoporosis without current pathological fracture: Secondary | ICD-10-CM | POA: Diagnosis not present

## 2023-04-28 NOTE — Telephone Encounter (Signed)
 Signed and put in box to go up front. Signed:  Santiago Bumpers, MD           04/28/2023

## 2023-04-28 NOTE — Telephone Encounter (Signed)
Forms faxed back, confirmation received.

## 2023-04-29 DIAGNOSIS — T84020D Dislocation of internal right hip prosthesis, subsequent encounter: Secondary | ICD-10-CM | POA: Diagnosis not present

## 2023-04-29 DIAGNOSIS — N189 Chronic kidney disease, unspecified: Secondary | ICD-10-CM | POA: Diagnosis not present

## 2023-04-29 DIAGNOSIS — J9611 Chronic respiratory failure with hypoxia: Secondary | ICD-10-CM | POA: Diagnosis not present

## 2023-04-29 DIAGNOSIS — I739 Peripheral vascular disease, unspecified: Secondary | ICD-10-CM | POA: Diagnosis not present

## 2023-04-29 DIAGNOSIS — J449 Chronic obstructive pulmonary disease, unspecified: Secondary | ICD-10-CM | POA: Diagnosis not present

## 2023-04-29 DIAGNOSIS — M810 Age-related osteoporosis without current pathological fracture: Secondary | ICD-10-CM | POA: Diagnosis not present

## 2023-04-29 DIAGNOSIS — M48061 Spinal stenosis, lumbar region without neurogenic claudication: Secondary | ICD-10-CM | POA: Diagnosis not present

## 2023-04-29 DIAGNOSIS — I129 Hypertensive chronic kidney disease with stage 1 through stage 4 chronic kidney disease, or unspecified chronic kidney disease: Secondary | ICD-10-CM | POA: Diagnosis not present

## 2023-04-29 DIAGNOSIS — M1990 Unspecified osteoarthritis, unspecified site: Secondary | ICD-10-CM | POA: Diagnosis not present

## 2023-05-02 DIAGNOSIS — R600 Localized edema: Secondary | ICD-10-CM | POA: Diagnosis not present

## 2023-05-04 ENCOUNTER — Telehealth: Payer: Self-pay

## 2023-05-04 DIAGNOSIS — I129 Hypertensive chronic kidney disease with stage 1 through stage 4 chronic kidney disease, or unspecified chronic kidney disease: Secondary | ICD-10-CM | POA: Diagnosis not present

## 2023-05-04 DIAGNOSIS — M519 Unspecified thoracic, thoracolumbar and lumbosacral intervertebral disc disorder: Secondary | ICD-10-CM

## 2023-05-04 DIAGNOSIS — M7981 Nontraumatic hematoma of soft tissue: Secondary | ICD-10-CM | POA: Diagnosis not present

## 2023-05-04 DIAGNOSIS — J9611 Chronic respiratory failure with hypoxia: Secondary | ICD-10-CM | POA: Diagnosis not present

## 2023-05-04 DIAGNOSIS — T84020D Dislocation of internal right hip prosthesis, subsequent encounter: Secondary | ICD-10-CM | POA: Diagnosis not present

## 2023-05-04 DIAGNOSIS — M48061 Spinal stenosis, lumbar region without neurogenic claudication: Secondary | ICD-10-CM | POA: Diagnosis not present

## 2023-05-04 DIAGNOSIS — I739 Peripheral vascular disease, unspecified: Secondary | ICD-10-CM | POA: Diagnosis not present

## 2023-05-04 DIAGNOSIS — M810 Age-related osteoporosis without current pathological fracture: Secondary | ICD-10-CM

## 2023-05-04 DIAGNOSIS — N189 Chronic kidney disease, unspecified: Secondary | ICD-10-CM | POA: Diagnosis not present

## 2023-05-04 DIAGNOSIS — M1990 Unspecified osteoarthritis, unspecified site: Secondary | ICD-10-CM

## 2023-05-04 DIAGNOSIS — I2510 Atherosclerotic heart disease of native coronary artery without angina pectoris: Secondary | ICD-10-CM | POA: Diagnosis not present

## 2023-05-04 DIAGNOSIS — J449 Chronic obstructive pulmonary disease, unspecified: Secondary | ICD-10-CM | POA: Diagnosis not present

## 2023-05-04 NOTE — Telephone Encounter (Signed)
 Copied from CRM [id number]. Topic: Clinical - Lab/Test Results >> May 04, 2023  4:33 PM [place] J wrote: Reason for CRM: Patient is wanting an update on test that was taken on her leg last Friday. Please call patient back with an update.  Copy of results printed and placed on provider desk for review

## 2023-05-06 DIAGNOSIS — L650 Telogen effluvium: Secondary | ICD-10-CM | POA: Diagnosis not present

## 2023-05-06 DIAGNOSIS — I872 Venous insufficiency (chronic) (peripheral): Secondary | ICD-10-CM | POA: Diagnosis not present

## 2023-05-06 DIAGNOSIS — L648 Other androgenic alopecia: Secondary | ICD-10-CM | POA: Diagnosis not present

## 2023-05-07 ENCOUNTER — Ambulatory Visit: Payer: Self-pay

## 2023-05-07 ENCOUNTER — Other Ambulatory Visit: Payer: Self-pay | Admitting: Primary Care

## 2023-05-07 DIAGNOSIS — I2510 Atherosclerotic heart disease of native coronary artery without angina pectoris: Secondary | ICD-10-CM | POA: Diagnosis not present

## 2023-05-07 DIAGNOSIS — J449 Chronic obstructive pulmonary disease, unspecified: Secondary | ICD-10-CM | POA: Diagnosis not present

## 2023-05-07 DIAGNOSIS — J9611 Chronic respiratory failure with hypoxia: Secondary | ICD-10-CM | POA: Diagnosis not present

## 2023-05-07 DIAGNOSIS — N189 Chronic kidney disease, unspecified: Secondary | ICD-10-CM | POA: Diagnosis not present

## 2023-05-07 DIAGNOSIS — M48061 Spinal stenosis, lumbar region without neurogenic claudication: Secondary | ICD-10-CM | POA: Diagnosis not present

## 2023-05-07 DIAGNOSIS — T84020D Dislocation of internal right hip prosthesis, subsequent encounter: Secondary | ICD-10-CM | POA: Diagnosis not present

## 2023-05-07 DIAGNOSIS — I739 Peripheral vascular disease, unspecified: Secondary | ICD-10-CM | POA: Diagnosis not present

## 2023-05-07 DIAGNOSIS — I129 Hypertensive chronic kidney disease with stage 1 through stage 4 chronic kidney disease, or unspecified chronic kidney disease: Secondary | ICD-10-CM | POA: Diagnosis not present

## 2023-05-07 DIAGNOSIS — M7981 Nontraumatic hematoma of soft tissue: Secondary | ICD-10-CM | POA: Diagnosis not present

## 2023-05-07 MED ORDER — TRELEGY ELLIPTA 100-62.5-25 MCG/ACT IN AEPB: 1.0000 | INHALATION_SPRAY | Freq: Every day | RESPIRATORY_TRACT | 3 refills | Status: DC

## 2023-05-07 NOTE — Telephone Encounter (Signed)
 Copied from CRM [id number]. Topic: Clinical - Medication Refill >> May 07, 2023  4:08 PM Isabell A wrote: Most Recent Primary Care Visit:  Provider: Jeoffrey Massed  Department: [facility]  Visit Type: OFFICE VISIT  Date: 04/22/2023  Medication: Fluticasone-Umeclidin-Vilant (TRELEGY ELLIPTA) 100-62.5-25 MCG/ACT AEPB  Has the patient contacted their pharmacy? Yes (Agent: If no, request that the patient contact the pharmacy for the refill. If patient does not wish to contact the pharmacy document the reason why and proceed with request.) (Agent: If yes, when and what did the pharmacy advise?)  Is this the correct pharmacy for this prescription? Yes If no, delete pharmacy and type the correct one.  This is the patient's preferred pharmacy:  [id number] - [place], [place] - [address] [place] [address] Phone: [phone] Fax: [phone]   Has the prescription been filled recently? Yes  Is the patient out of the medication? Yes  Has the patient been seen for an appointment in the last year OR does the patient have an upcoming appointment? Yes  Can we respond through MyChart? No  Agent: Please be advised that Rx refills may take up to 3 business days. We ask that you follow-up with your pharmacy.

## 2023-05-07 NOTE — Telephone Encounter (Signed)
 E2C2 Pulmonary Triage - Initial Assessment Questions "Chief Complaint (e.g., cough, sob, wheezing, fever, chills, sweat or additional symptoms) *Go to specific symptom protocol after initial questions. SOB with exertion  "How long have symptoms been present?" 10-12 days  Have you tested for COVID or Flu? Note: If not, ask patient if a home test can be taken. If so, instruct patient to call back for positive results. No  MEDICINES:   "Have you used any OTC meds to help with symptoms?" No If yes, ask "What medications?" NA  "Have you used your inhalers/maintenance medication?"  If yes, "What medications?" Pt has been using husband's Albuterol as she has no refills  If inhaler, ask "How many puffs and how often?" Note: Review instructions on medication in the chart. 1-2 puffs 2-3/day  OXYGEN: "Do you wear supplemental oxygen?" Yes If yes, "How many liters are you supposed to use?" 2 LPM  "Do you monitor your oxygen levels?" Yes If yes, "What is your reading (oxygen level) today?" 97%  "What is your usual oxygen saturation reading?"  (Note: Pulmonary O2 sats should be 90% or greater) 96-98%  Per protocol pt to be seen today, advised UC due to no availability in office. Pt's spouse and pt agreeable to plan.    Copied from CRM [id number]. Topic: Clinical - Red Word Triage >> May 07, 2023  5:26 PM Para March B wrote: [facility] that prompted transfer to Nurse Triage: Diff Breathing Reason for Disposition  [1] Longstanding difficulty breathing (e.g., CHF, COPD, emphysema) AND [2] WORSE than normal  Answer Assessment - Initial Assessment Questions 1. RESPIRATORY STATUS: "Describe your breathing?" (e.g., wheezing, shortness of breath, unable to speak, severe coughing)      SOB with exertion 2. ONSET: "When did this breathing problem begin?"      10- 12 days ago 3. PATTERN "Does the difficult breathing come and go, or has it been constant since it started?"      Intermittent, with  exertion 4. SEVERITY: "How bad is your breathing?" (e.g., mild, moderate, severe)    - MILD: No SOB at rest, mild SOB with walking, speaks normally in sentences, can lie down, no retractions, pulse < 100.    - MODERATE: SOB at rest, SOB with minimal exertion and prefers to sit, cannot lie down flat, speaks in phrases, mild retractions, audible wheezing, pulse 100-120.    - SEVERE: Very SOB at rest, speaks in single words, struggling to breathe, sitting hunched forward, retractions, pulse > 120      Mild, with exertion 5. RECURRENT SYMPTOM: "Have you had difficulty breathing before?" If Yes, ask: "When was the last time?" and "What happened that time?"      No 8. CAUSE: "What do you think is causing the breathing problem?"      Unknown 9. OTHER SYMPTOMS: "Do you have any other symptoms? (e.g., dizziness, runny nose, cough, chest pain, fever)     None  Protocols used: Breathing Difficulty-A-AH

## 2023-05-10 DIAGNOSIS — R0602 Shortness of breath: Secondary | ICD-10-CM | POA: Diagnosis not present

## 2023-05-11 DIAGNOSIS — J9611 Chronic respiratory failure with hypoxia: Secondary | ICD-10-CM | POA: Diagnosis not present

## 2023-05-11 DIAGNOSIS — I129 Hypertensive chronic kidney disease with stage 1 through stage 4 chronic kidney disease, or unspecified chronic kidney disease: Secondary | ICD-10-CM | POA: Diagnosis not present

## 2023-05-11 DIAGNOSIS — T84020D Dislocation of internal right hip prosthesis, subsequent encounter: Secondary | ICD-10-CM | POA: Diagnosis not present

## 2023-05-11 DIAGNOSIS — I739 Peripheral vascular disease, unspecified: Secondary | ICD-10-CM | POA: Diagnosis not present

## 2023-05-11 DIAGNOSIS — J449 Chronic obstructive pulmonary disease, unspecified: Secondary | ICD-10-CM | POA: Diagnosis not present

## 2023-05-11 DIAGNOSIS — N189 Chronic kidney disease, unspecified: Secondary | ICD-10-CM | POA: Diagnosis not present

## 2023-05-11 DIAGNOSIS — I2510 Atherosclerotic heart disease of native coronary artery without angina pectoris: Secondary | ICD-10-CM | POA: Diagnosis not present

## 2023-05-11 DIAGNOSIS — M48061 Spinal stenosis, lumbar region without neurogenic claudication: Secondary | ICD-10-CM | POA: Diagnosis not present

## 2023-05-11 DIAGNOSIS — M7981 Nontraumatic hematoma of soft tissue: Secondary | ICD-10-CM | POA: Diagnosis not present

## 2023-05-12 DIAGNOSIS — I2510 Atherosclerotic heart disease of native coronary artery without angina pectoris: Secondary | ICD-10-CM | POA: Diagnosis not present

## 2023-05-12 DIAGNOSIS — M7981 Nontraumatic hematoma of soft tissue: Secondary | ICD-10-CM | POA: Diagnosis not present

## 2023-05-12 DIAGNOSIS — N189 Chronic kidney disease, unspecified: Secondary | ICD-10-CM | POA: Diagnosis not present

## 2023-05-12 DIAGNOSIS — M48061 Spinal stenosis, lumbar region without neurogenic claudication: Secondary | ICD-10-CM | POA: Diagnosis not present

## 2023-05-12 DIAGNOSIS — J9611 Chronic respiratory failure with hypoxia: Secondary | ICD-10-CM | POA: Diagnosis not present

## 2023-05-12 DIAGNOSIS — I129 Hypertensive chronic kidney disease with stage 1 through stage 4 chronic kidney disease, or unspecified chronic kidney disease: Secondary | ICD-10-CM | POA: Diagnosis not present

## 2023-05-12 DIAGNOSIS — J449 Chronic obstructive pulmonary disease, unspecified: Secondary | ICD-10-CM | POA: Diagnosis not present

## 2023-05-12 DIAGNOSIS — T84020D Dislocation of internal right hip prosthesis, subsequent encounter: Secondary | ICD-10-CM | POA: Diagnosis not present

## 2023-05-12 DIAGNOSIS — I739 Peripheral vascular disease, unspecified: Secondary | ICD-10-CM | POA: Diagnosis not present

## 2023-05-19 DIAGNOSIS — M7981 Nontraumatic hematoma of soft tissue: Secondary | ICD-10-CM | POA: Diagnosis not present

## 2023-05-19 DIAGNOSIS — R0602 Shortness of breath: Secondary | ICD-10-CM | POA: Diagnosis not present

## 2023-05-19 DIAGNOSIS — J9611 Chronic respiratory failure with hypoxia: Secondary | ICD-10-CM | POA: Diagnosis not present

## 2023-05-19 DIAGNOSIS — I2510 Atherosclerotic heart disease of native coronary artery without angina pectoris: Secondary | ICD-10-CM | POA: Diagnosis not present

## 2023-05-19 DIAGNOSIS — I129 Hypertensive chronic kidney disease with stage 1 through stage 4 chronic kidney disease, or unspecified chronic kidney disease: Secondary | ICD-10-CM | POA: Diagnosis not present

## 2023-05-19 DIAGNOSIS — T84020D Dislocation of internal right hip prosthesis, subsequent encounter: Secondary | ICD-10-CM | POA: Diagnosis not present

## 2023-05-19 DIAGNOSIS — M48061 Spinal stenosis, lumbar region without neurogenic claudication: Secondary | ICD-10-CM | POA: Diagnosis not present

## 2023-05-19 DIAGNOSIS — I739 Peripheral vascular disease, unspecified: Secondary | ICD-10-CM | POA: Diagnosis not present

## 2023-05-19 DIAGNOSIS — N189 Chronic kidney disease, unspecified: Secondary | ICD-10-CM | POA: Diagnosis not present

## 2023-05-19 DIAGNOSIS — J449 Chronic obstructive pulmonary disease, unspecified: Secondary | ICD-10-CM | POA: Diagnosis not present

## 2023-05-21 ENCOUNTER — Emergency Department (HOSPITAL_COMMUNITY)

## 2023-05-21 ENCOUNTER — Other Ambulatory Visit: Payer: Self-pay

## 2023-05-21 ENCOUNTER — Emergency Department (HOSPITAL_COMMUNITY)
Admission: EM | Admit: 2023-05-21 | Discharge: 2023-05-21 | Disposition: A | Discharge status: Home / Self Care | Attending: Emergency Medicine | Admitting: Emergency Medicine

## 2023-05-21 DIAGNOSIS — R0602 Shortness of breath: Secondary | ICD-10-CM | POA: Diagnosis not present

## 2023-05-21 DIAGNOSIS — M7981 Nontraumatic hematoma of soft tissue: Secondary | ICD-10-CM | POA: Diagnosis not present

## 2023-05-21 DIAGNOSIS — J449 Chronic obstructive pulmonary disease, unspecified: Secondary | ICD-10-CM | POA: Diagnosis not present

## 2023-05-21 DIAGNOSIS — J441 Chronic obstructive pulmonary disease with (acute) exacerbation: Secondary | ICD-10-CM | POA: Diagnosis not present

## 2023-05-21 DIAGNOSIS — J9611 Chronic respiratory failure with hypoxia: Secondary | ICD-10-CM | POA: Diagnosis not present

## 2023-05-21 DIAGNOSIS — E039 Hypothyroidism, unspecified: Secondary | ICD-10-CM | POA: Insufficient documentation

## 2023-05-21 DIAGNOSIS — I2510 Atherosclerotic heart disease of native coronary artery without angina pectoris: Secondary | ICD-10-CM | POA: Diagnosis not present

## 2023-05-21 DIAGNOSIS — R059 Cough, unspecified: Secondary | ICD-10-CM | POA: Insufficient documentation

## 2023-05-21 DIAGNOSIS — I129 Hypertensive chronic kidney disease with stage 1 through stage 4 chronic kidney disease, or unspecified chronic kidney disease: Secondary | ICD-10-CM | POA: Diagnosis not present

## 2023-05-21 DIAGNOSIS — N189 Chronic kidney disease, unspecified: Secondary | ICD-10-CM | POA: Diagnosis not present

## 2023-05-21 DIAGNOSIS — Z79899 Other long term (current) drug therapy: Secondary | ICD-10-CM | POA: Diagnosis not present

## 2023-05-21 DIAGNOSIS — T84020D Dislocation of internal right hip prosthesis, subsequent encounter: Secondary | ICD-10-CM | POA: Diagnosis not present

## 2023-05-21 DIAGNOSIS — M48061 Spinal stenosis, lumbar region without neurogenic claudication: Secondary | ICD-10-CM | POA: Diagnosis not present

## 2023-05-21 DIAGNOSIS — I10 Essential (primary) hypertension: Secondary | ICD-10-CM | POA: Diagnosis not present

## 2023-05-21 DIAGNOSIS — I739 Peripheral vascular disease, unspecified: Secondary | ICD-10-CM | POA: Diagnosis not present

## 2023-05-21 LAB — URINALYSIS, W/ REFLEX TO CULTURE (INFECTION SUSPECTED)
Bacteria, UA: NONE SEEN
Bilirubin Urine: NEGATIVE
Glucose, UA: NEGATIVE mg/dL
Hgb urine dipstick: NEGATIVE
Ketones, ur: NEGATIVE mg/dL
Leukocytes,Ua: NEGATIVE
Nitrite: NEGATIVE
Protein, ur: NEGATIVE mg/dL
Specific Gravity, Urine: 1.016 (ref 1.005–1.030)
pH: 5 (ref 5.0–8.0)

## 2023-05-21 LAB — BASIC METABOLIC PANEL WITH GFR
Anion gap: 9 (ref 5–15)
BUN: 19 mg/dL (ref 8–23)
CO2: 25 mmol/L (ref 22–32)
Calcium: 9.2 mg/dL (ref 8.9–10.3)
Chloride: 107 mmol/L (ref 98–111)
Creatinine, Ser: 0.55 mg/dL (ref 0.44–1.00)
GFR, Estimated: >60 mL/min (ref >60)
Glucose, Bld: 108 mg/dL — ABNORMAL HIGH (ref 70–99)
Potassium: 3.8 mmol/L (ref 3.5–5.1)
Sodium: 141 mmol/L (ref 135–145)

## 2023-05-21 LAB — CBC WITH DIFFERENTIAL/PLATELET
Abs Immature Granulocytes: 0.04 10*3/uL (ref 0.00–0.07)
Basophils Absolute: 0 10*3/uL (ref 0.0–0.1)
Basophils Relative: 1 %
Eosinophils Absolute: 0.1 10*3/uL (ref 0.0–0.5)
Eosinophils Relative: 1 %
HCT: 38.5 % (ref 36.0–46.0)
Hemoglobin: 12 g/dL (ref 12.0–15.0)
Immature Granulocytes: 1 %
Lymphocytes Relative: 9 %
Lymphs Abs: 0.7 10*3/uL (ref 0.7–4.0)
MCH: 29.4 pg (ref 26.0–34.0)
MCHC: 31.2 g/dL (ref 30.0–36.0)
MCV: 94.4 fL (ref 80.0–100.0)
Monocytes Absolute: 0.4 10*3/uL (ref 0.1–1.0)
Monocytes Relative: 5 %
Neutro Abs: 6.5 10*3/uL (ref 1.7–7.7)
Neutrophils Relative %: 83 %
Platelets: 264 10*3/uL (ref 150–400)
RBC: 4.08 MIL/uL (ref 3.87–5.11)
RDW: 12.8 % (ref 11.5–15.5)
WBC: 7.7 10*3/uL (ref 4.0–10.5)
nRBC: 0 % (ref 0.0–0.2)

## 2023-05-21 LAB — RESP PANEL BY RT-PCR (RSV, FLU A&B, COVID)  RVPGX2
Influenza A by PCR: NEGATIVE
Influenza B by PCR: NEGATIVE
Resp Syncytial Virus by PCR: NEGATIVE
SARS Coronavirus 2 by RT PCR: NEGATIVE

## 2023-05-21 MED ORDER — IPRATROPIUM-ALBUTEROL 0.5-2.5 (3) MG/3ML IN SOLN
3.0000 mL | Freq: Once | RESPIRATORY_TRACT | Status: AC
  Administered 2023-05-21: 3 mL via RESPIRATORY_TRACT
  Filled 2023-05-21: qty 3

## 2023-05-21 MED ORDER — PREDNISONE 10 MG PO TABS: 40.0000 mg | ORAL_TABLET | Freq: Every day | ORAL | 0 refills | Status: DC

## 2023-05-21 MED ORDER — PREDNISONE 20 MG PO TABS
60.0000 mg | ORAL_TABLET | Freq: Once | ORAL | Status: AC
  Administered 2023-05-21: 60 mg via ORAL
  Filled 2023-05-21: qty 3

## 2023-05-21 NOTE — ED Provider Notes (Signed)
  EMERGENCY DEPARTMENT AT [facility] Provider Note   CSN: [id number] Arrival date & time: 05/21/23  1607     History  Chief Complaint  Patient presents with   Shortness of Breath    Priscilla Cantrell is a 88 y.o. female.  Patient is an 88 year old female with a past medical history of COPD on 2 L home O2, hypertension, hypothyroidism presenting to the emergency department with shortness of breath.  Patient states for the last 3 to 4 days she has had some mild shortness of breath.  She states that she went to urgent care on Tuesday and was told that her lungs sounded clear and she was sent home.  She was given a prescription for albuterol but states that she has not felt like she is needed to use it.  She states that home health nurse did come today and recommended that she use one of her breathing treatments which she states did help.  She states that today she started develop a mild cough that is nonproductive.  Denies any fevers.  She states that she has had some swelling in her left leg since the fall but states that she has had a negative DVT ultrasound within the last week.  Patient also endorses urinary frequency and urgency for the last few days, denies any dysuria or hematuria.  She states that this does feel similar to prior COPD exacerbations.  The history is provided by the patient.  Shortness of Breath      Home Medications Prior to Admission medications   Medication Sig Start Date End Date Taking? Authorizing Provider  predniSONE (DELTASONE) 10 MG tablet Take 4 tablets (40 mg total) by mouth daily. 05/21/23  Yes Elayne Snare K, DO  acetaminophen (TYLENOL) 325 MG tablet Take 2 tablets (650 mg total) by mouth every 6 (six) hours. 02/14/23   Almon Hercules, MD  albuterol (PROAIR HFA) 108 (90 Base) MCG/ACT inhaler Inhale 2 puffs into the lungs every 4 (four) hours as needed for wheezing or shortness of breath. 12/30/22   Mannam, Colbert Coyer, MD  amLODipine  (NORVASC) 10 MG tablet Take 1 tablet (10 mg total) by mouth daily. 04/14/23   McGowen, Maryjean Morn, MD  fluticasone (FLONASE) 50 MCG/ACT nasal spray Place 2 sprays into both nostrils daily. 05/22/20   McGowen, Maryjean Morn, MD  Fluticasone-Umeclidin-Vilant (TRELEGY ELLIPTA) 100-62.5-25 MCG/ACT AEPB Inhale 1 puff into the lungs daily. 05/07/23   Glenford Bayley, NP  levothyroxine (SYNTHROID) 100 MCG tablet TAKE 1 TABLET BY MOUTH EVERY DAY 04/14/23   McGowen, Maryjean Morn, MD  lidocaine (XYLOCAINE) 2 % solution Apply 1/2 ml to affected area of mouth 3 times per day as needed for pain 03/05/23   McGowen, Maryjean Morn, MD  Misc. Devices (PULSE OXIMETER) MISC Check oxygen level as needed 03/12/22   McGowen, Maryjean Morn, MD  pantoprazole (PROTONIX) 40 MG tablet Take 1 tablet (40 mg total) by mouth 2 (two) times daily. 04/22/23   McGowen, Maryjean Morn, MD  venlafaxine (EFFEXOR) 50 MG tablet Take 50 mg by mouth 2 (two) times daily. 11/28/22   [provider]  vitamin B-12 (CYANOCOBALAMIN) 100 MCG tablet Take 100 mcg by mouth daily.    [provider]  VITAMIN D PO Take 1 tablet by mouth daily.    [provider]      Allergies    Losartan, Penicillins, Cefixime [kdc:cefixime], Gabapentin, Indocin [indomethacin], Singulair [montelukast sodium], Sulfa antibiotics, Ace inhibitors, and Statins  Review of Systems   Review of Systems  Respiratory:  Positive for shortness of breath.     Physical Exam Updated Vital Signs BP (!) 141/76 (BP Location: Right Arm)   Pulse 76   Temp 97.8 F (36.6 C) (Oral)   Resp 18   Ht 5\' 2"  (1.575 m)   Wt 54 kg   SpO2 100%   BMI 21.77 kg/m  Physical Exam Vitals and nursing note reviewed.  Constitutional:      General: She is not in acute distress.    Appearance: She is well-developed.  HENT:     Head: Normocephalic and atraumatic.     Mouth/Throat:     Mouth: Mucous membranes are moist.  Eyes:     Extraocular Movements: Extraocular movements intact.   Cardiovascular:     Rate and Rhythm: Normal rate and regular rhythm.     Heart sounds: Normal heart sounds.  Pulmonary:     Effort: Pulmonary effort is normal.     Breath sounds: Wheezing (Prolonged expiratory phase with end expiratory wheeze diffusely) present.  Abdominal:     Palpations: Abdomen is soft.     Tenderness: There is no abdominal tenderness.  Musculoskeletal:        General: Normal range of motion.     Cervical back: Normal range of motion and neck supple.     Right lower leg: No edema.     Left lower leg: No edema.     Comments: Mild erythema to left calf, no tenderness to palpation, no wounds  Skin:    General: Skin is warm and dry.  Neurological:     General: No focal deficit present.     Mental Status: She is alert and oriented to person, place, and time.  Psychiatric:        Mood and Affect: Mood normal.        Behavior: Behavior normal.     ED Results / Procedures / Treatments   Labs (all labs ordered are listed, but only abnormal results are displayed) Labs Reviewed  BASIC METABOLIC PANEL WITH GFR - Abnormal; Notable for the following components:      Result Value   Glucose, Bld 108 (*)    All other components within normal limits  RESP PANEL BY RT-PCR (RSV, FLU A&B, COVID)  [id number]  CBC WITH DIFFERENTIAL/PLATELET  URINALYSIS, W/ REFLEX TO CULTURE (INFECTION SUSPECTED)    EKG EKG Interpretation Date/Time:  Thursday May 21 2023 17:12:22 EDT Ventricular Rate:  73 PR Interval:    QRS Duration:  139 QT Interval:  419 QTC Calculation: 462 R Axis:   36  Text Interpretation: Normal sinus rhythm Right bundle branch block Anteroseptal infarct, age indeterminate Lateral leads are also involved Artifact in lead(s) I III aVR aVL aVF Otherwise no significant change Confirmed by Elayne Snare ([id number]) on 05/21/2023 5:29:26 PM  Radiology DG Chest 2 View Result Date: 05/21/2023 CLINICAL DATA:  SOB. EXAM: CHEST - 2 VIEW COMPARISON:  05/07/2022. FINDINGS:  Bilateral lungs appear hyperexpanded and hyperlucent with coarse bronchovascular markings, in keeping with COPD. Bilateral lungs otherwise appear clear. No dense consolidation or lung collapse. Bilateral costophrenic angles are clear. Stable cardio-mediastinal silhouette. No acute osseous abnormalities. The soft tissues are within normal limits. IMPRESSION: No active cardiopulmonary disease. COPD. Electronically Signed   By: Jules Schick M.D.   On: 05/21/2023 18:08    Procedures Procedures    Medications Ordered in ED Medications  ipratropium-albuterol (DUONEB) 0.5-2.5 (3) MG/3ML nebulizer solution 3 mL (  3 mLs Nebulization Given 05/21/23 1640)  predniSONE (DELTASONE) tablet 60 mg (60 mg Oral Given 05/21/23 1640)  ipratropium-albuterol (DUONEB) 0.5-2.5 (3) MG/3ML nebulizer solution 3 mL (3 mLs Nebulization Given 05/21/23 1810)    ED Course/ Medical Decision Making/ A&P Clinical Course as of 05/21/23 1855  Thu May 21, 2023  1744 Labs within normal range. CXR and viral swab pending. [VK]  1803 Patient reports no significant change in symptoms, moving better air on exam but still wheezing and will be given additional neb. [VK]  1851 Patient reports symptoms are improving, lungs are now clear on auscultation. CXR clear, minimal clear sputum so antibiotics are not indicated. She is stable for discharge home on steroid course and recommended outpatient follow up. [VK]    Clinical Course User Index [VK] Kingsley, Aamir Mclinden K, DO                                 Medical Decision Making This patient presents to the ED with chief complaint(s) of shortness of breath, urinary frequency with pertinent past medical history of COPD on 2 L home O2, hypertension, hypothyroidism which further complicates the presenting complaint. The complaint involves an extensive differential diagnosis and also carries with it a high risk of complications and morbidity.    The differential diagnosis includes ACS,  arrhythmia, anemia, pneumonia, pneumothorax, pulmonary edema, pleural effusion, viral syndrome, COPD exacerbation, considering PE this is less likely as she has wheezing on exam to explain her symptoms and has had negative recent DVT study, UTI, no signs of sepsis or Pilo on exam  Additional history obtained: Additional history obtained from N/A Records reviewed Primary Care Documents and urgent care records  ED Course and Reassessment: On patient's arrival she is hemodynamically stable in no acute distress, does have wheezing with prolonged expiratory phase on exam.  Will be given prednisone and DuoNeb.  Will have labs including viral swab, EKG and chest x-ray performed and will be closely reassessed.  Independent labs interpretation:  The following labs were independently interpreted: within normal range  Independent visualization of imaging: - I independently visualized the following imaging with scope of interpretation limited to determining acute life threatening conditions related to emergency care: CXR, which revealed no acute disease  Consultation: - Consulted or discussed management/test interpretation w/ external professional: N/A  Consideration for admission or further workup: Patient has no emergent conditions requiring admission or further work-up at this time and is stable for discharge home with primary care follow-up  Social Determinants of health: N/A    Amount and/or Complexity of Data Reviewed Labs: ordered. Radiology: ordered.  Risk Prescription drug management.          Final Clinical Impression(s) / ED Diagnoses Final diagnoses:  COPD exacerbation (HCC)    Rx / DC Orders ED Discharge Orders          Ordered    predniSONE (DELTASONE) 10 MG tablet  Daily        05/21/23 1853              Kingsley, Tamura Lasky K, DO 05/21/23 1855

## 2023-05-21 NOTE — Discharge Instructions (Signed)
 You were seen in the emergency department for your shortness of breath.  You did have wheezing consistent with a COPD exacerbation.  Your chest x-ray showed no signs of pneumonia and your viral swab was normal.  We gave you breathing treatments and steroids in the ER with improvement of your symptoms.  I have given you a course of steroids he should complete this as prescribed.  You should use your albuterol every 4 hours for the next 24 hours while awake and then can use as needed.  You should follow-up with your primary doctor in the next few days to have your symptoms rechecked.  You should return to the emergency department for worsening shortness of breath, severe chest pain, fevers or any other new or concerning symptoms.

## 2023-05-21 NOTE — ED Triage Notes (Signed)
 Pt sent from urgent care for shortness of breath and difficulty breathing while ambulating. Pt was seen earlier this week for similar symptoms. Today, her symptoms are worse and pt unable to catch her breath. Pt is alert and oriented x4. Vitals are stable and lungs are clear.

## 2023-05-22 ENCOUNTER — Telehealth: Payer: Self-pay

## 2023-05-22 NOTE — Transitions of Care (Post Inpatient/ED Visit) (Signed)
 05/22/2023  Name: Priscilla Cantrell MRN: [id number] DOB: Feb 09, 1935  Today's TOC FU Call Status: Today's TOC FU Call Status:: Successful TOC FU Call Completed TOC FU Call Complete Date: 05/22/23 [name] Name and Date of Birth confirmed.  Transition Care Management Follow-up Telephone Call Date of Discharge: 05/21/23 Discharge Facility: [facility]) Type of Discharge: Emergency Department Reason for ED Visit: Other: (COPD) How have you been since you were released from the hospital?: Better Any questions or concerns?: No  Items Reviewed: Did you receive and understand the discharge instructions provided?: Yes Medications obtained,verified, and reconciled?: Yes (Medications Reviewed) Any new allergies since your discharge?: No Dietary orders reviewed?: Yes Do you have support at home?: Yes People in Home [RPT]: friend(s)  Medications Reviewed Today: Medications Reviewed Today     Reviewed by Darrall Ellison, LPN (Licensed Practical Nurse) on 05/22/23 at 1124  Med List Status: <None>   Medication Order Taking? Sig Documenting Provider Last Dose Status Informant  acetaminophen  (TYLENOL ) 325 MG tablet [id number] No Take 2 tablets (650 mg total) by mouth every 6 (six) hours. Gonfa, Taye T, MD Taking Active   albuterol  (PROAIR  HFA) 108 (90 Base) MCG/ACT inhaler [id number] No Inhale 2 puffs into the lungs every 4 (four) hours as needed for wheezing or shortness of breath. Mannam, Praveen, MD Taking Active Spouse/Significant Other, Pharmacy Records           Med Note Nanine Babcock, [facility] D   Tue Feb 10, 2023 11:43 AM) unknown  amLODipine  (NORVASC ) 10 MG tablet [id number] No Take 1 tablet (10 mg total) by mouth daily. McGowen, Philip H, MD Taking Active   fluticasone  (FLONASE ) 50 MCG/ACT nasal spray [id number] No Place 2 sprays into both nostrils daily. McGowen, Minetta Aly, MD Taking Active Spouse/Significant Other, Pharmacy Records           Med Note Nanine Babcock, [facility] D   Tue Feb 10, 2023 11:43 AM) unknown  Fluticasone -Umeclidin-Vilant (TRELEGY ELLIPTA ) 100-62.5-25 MCG/ACT AEPB [id number]  Inhale 1 puff into the lungs daily. Antonio Baumgarten, NP  Active   levothyroxine  (SYNTHROID ) 100 MCG tablet [id number] No TAKE 1 TABLET BY MOUTH EVERY DAY McGowen, Minetta Aly, MD Taking Active   lidocaine  (XYLOCAINE ) 2 % solution [id number] No Apply 1/2 ml to affected area of mouth 3 times per day as needed for pain McGowen, Minetta Aly, MD Taking Active   Misc. Devices (PULSE OXIMETER) MISC [id number] No Check oxygen  level as needed McGowen, Minetta Aly, MD Taking Active Spouse/Significant Other, Pharmacy Records  pantoprazole  (PROTONIX ) 40 MG tablet [id number]  Take 1 tablet (40 mg total) by mouth 2 (two) times daily. McGowen, Philip H, MD  Active   predniSONE  (DELTASONE ) 10 MG tablet [id number]  Take 4 tablets (40 mg total) by mouth daily. Kingsley, Victoria K, DO  Active   venlafaxine  (EFFEXOR ) 50 MG tablet [id number] No Take 50 mg by mouth 2 (two) times daily. [provider] Taking Active Spouse/Significant Other, Pharmacy Records  vitamin B-12 (CYANOCOBALAMIN ) 100 MCG tablet [id number] No Take 100 mcg by mouth daily. [provider] Taking Active Spouse/Significant Other, Pharmacy Records  VITAMIN D  PO [id number] No Take 1 tablet by mouth daily. [provider] Taking Active Spouse/Significant Other, Pharmacy Records            Home Care and Equipment/Supplies: Were Home Health Services Ordered?: NA Any new equipment or medical supplies ordered?: NA  Functional Questionnaire: Do you need assistance with bathing/showering or dressing?: No Do you need assistance with meal preparation?:  No Do you need assistance with eating?: No Do you have difficulty maintaining continence: No Do you need assistance with getting out of bed/getting out of a chair/moving?: No Do you have difficulty managing or taking your medications?: No  Follow up appointments reviewed: PCP  Follow-up appointment confirmed?: No (unable to schedule ER f/u schedule has unavailable) MD Provider Line [phone] Given: No Specialist Hospital Follow-up appointment confirmed?: NA Do you need transportation to your follow-up appointment?: No Do you understand care options if your condition(s) worsen?: Yes-patient verbalized understanding    SIGNATURE Darrall Ellison, LPN [facility] Nurse Health Advisor Direct Dial [phone]

## 2023-05-22 NOTE — Telephone Encounter (Signed)
 Noted: nurse phone contact with patient for TCM. Signed:  Arletha Lady, MD           05/22/2023

## 2023-05-25 ENCOUNTER — Telehealth: Payer: Self-pay

## 2023-05-25 DIAGNOSIS — M48061 Spinal stenosis, lumbar region without neurogenic claudication: Secondary | ICD-10-CM | POA: Diagnosis not present

## 2023-05-25 DIAGNOSIS — I739 Peripheral vascular disease, unspecified: Secondary | ICD-10-CM | POA: Diagnosis not present

## 2023-05-25 DIAGNOSIS — M7981 Nontraumatic hematoma of soft tissue: Secondary | ICD-10-CM | POA: Diagnosis not present

## 2023-05-25 DIAGNOSIS — J449 Chronic obstructive pulmonary disease, unspecified: Secondary | ICD-10-CM | POA: Diagnosis not present

## 2023-05-25 DIAGNOSIS — J9611 Chronic respiratory failure with hypoxia: Secondary | ICD-10-CM | POA: Diagnosis not present

## 2023-05-25 DIAGNOSIS — I2510 Atherosclerotic heart disease of native coronary artery without angina pectoris: Secondary | ICD-10-CM | POA: Diagnosis not present

## 2023-05-25 DIAGNOSIS — I129 Hypertensive chronic kidney disease with stage 1 through stage 4 chronic kidney disease, or unspecified chronic kidney disease: Secondary | ICD-10-CM | POA: Diagnosis not present

## 2023-05-25 DIAGNOSIS — T84020D Dislocation of internal right hip prosthesis, subsequent encounter: Secondary | ICD-10-CM | POA: Diagnosis not present

## 2023-05-25 DIAGNOSIS — N189 Chronic kidney disease, unspecified: Secondary | ICD-10-CM | POA: Diagnosis not present

## 2023-05-25 NOTE — Telephone Encounter (Signed)
 Communication  Reason for CRM: Patient in the ER on 04/17, needs a hospital follow up. First available is outside of the 14 days, patient would like follow up on appointment status.    Pt scheduled for 4/24 @ 11:00. Approved by Dr. McGowen.

## 2023-05-26 ENCOUNTER — Telehealth: Payer: Self-pay

## 2023-05-26 NOTE — Telephone Encounter (Signed)
 Home health orders received 05/25/23 for [facility] health initiation orders: No.  Home health re-certification orders: Yes. Patient last seen by ordering physician for this condition: 04/22/23. Must be less than 90 days for re-certification and less than 30 days prior for initiation. Visit must have been for the condition the orders are being placed.  Patient meets criteria for Physician to sign orders: Yes.        Current med list has been attached: No        Orders placed on physicians desk for signature: 05/26/23 (date) If patient does not meet criteria for orders to be signed: pt was called to schedule appt. Appt is scheduled for n/a.   Roddie Cisco

## 2023-05-27 NOTE — Telephone Encounter (Signed)
 Signed and put in box to go up front. Signed:  Arletha Lady, MD           05/27/2023

## 2023-05-28 ENCOUNTER — Ambulatory Visit (INDEPENDENT_AMBULATORY_CARE_PROVIDER_SITE_OTHER): Admitting: Family Medicine

## 2023-05-28 ENCOUNTER — Encounter: Payer: Self-pay | Admitting: Family Medicine

## 2023-05-28 VITALS — BP 132/79 | HR 73 | Temp 97.9°F | Ht 62.0 in | Wt 117.8 lb

## 2023-05-28 DIAGNOSIS — J9611 Chronic respiratory failure with hypoxia: Secondary | ICD-10-CM

## 2023-05-28 DIAGNOSIS — J441 Chronic obstructive pulmonary disease with (acute) exacerbation: Secondary | ICD-10-CM | POA: Diagnosis not present

## 2023-05-28 DIAGNOSIS — J301 Allergic rhinitis due to pollen: Secondary | ICD-10-CM

## 2023-05-28 MED ORDER — PREDNISONE 10 MG PO TABS: ORAL_TABLET | ORAL | 0 refills | Status: DC

## 2023-05-28 MED ORDER — FLUTICASONE PROPIONATE 50 MCG/ACT NA SUSP: 2.0000 | Freq: Every day | NASAL | 6 refills | Status: AC

## 2023-05-28 MED ORDER — AZITHROMYCIN 250 MG PO TABS: ORAL_TABLET | ORAL | 0 refills | Status: DC

## 2023-05-28 NOTE — Progress Notes (Signed)
 OFFICE VISIT  05/28/2023  CC:  Chief Complaint  Patient presents with   Medical Management of Chronic Issues    Patient is a 88 y.o. female who presents accompanied by her friend/caregiver Carolene Chute for emergency department follow-up from 05/21/2023 at [facility] health emergency department at [facility]. Entire encounter reviewed today.  Presented with shortness of breath, wheezing, and cough. Nebulizer treatments were given, prednisone  given, symptoms improved.  Chest x-ray showed COPD and no active cardiopulmonary disease.  UA was normal.  Basic metabolic panel and CBC normal.  Respiratory panel negative.  She was discharged home with prescription for prednisone .  INTERIM HX: Antoine Bathe states that she is no better. Wheezing, some shortness of breath.  Some cough but nothing that keeps her up at night.  She has some nasal congestion/runny nose with postnasal drip.  No facial pain or sinus pressure.  No headaches.  No fever. She has a little bit of mucus production that has been milky yellow but today turned a little bit green. No chest pain. She has been doing albuterol  nebulizer treatments twice a day for the last week.  Review of systems: No nausea, vomiting, diarrhea, or abdominal pain. No dysuria.  No changes in lower extremities. No rash.  Past Medical History:  Diagnosis Date   Allergic rhinitis    with upper airway cough: as of 07/2017 pulm f/u she was instructed to use astelin , flonase , and saline more consistently   BPPV (benign paroxysmal positional vertigo) 03/2018   C. difficile colitis    Chest pain, non-cardiac    Cardiac CT showed no signif obstructive dz, mildly elevated calcium  level (Dr. Audery Blazing).   Chronic cystitis with hematuria    Dr. Lyndol Santee   Chronic hypoxemic respiratory failure (HCC) 02/02/2015   2L oxygen  24/7   Chronic renal insufficiency, stage 2 (mild)    GFR approx 60   Cold hands    NCS/EMGs normal.  Hand dysesthesias per neuro--reassured   COPD  (chronic obstructive pulmonary disease) (HCC)    GOLD 4.  spirometry 01/10/09 FEV 0.97(52%), FEV1% 47.  With chronic bronchitis as of 07/2017.   DDD (degenerative disc disease), cervical    1998 MRI C5-6 impingemt (left).  MRI 2021 (neurol) 1 level canal stenosis, 1 level foraminal stenosis--->PT   DDD (degenerative disc disease), lumbar    Depression with anxiety 04/15/2011   Diarrheal disease Summer 2017   ? 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 Dr Andriette Keeling).  As of 10/05/15 pt reports GI dx'd her with C diff and rx'd more flagyl  and she is also on cholestyramine .  As of 02/2016, pt's sx's resolved completely.   Diverticulosis of colon    Procto '97 and colonoscopy 2002   Dysplastic nevus of upper extremity 04/2014   R tricep (Dr. Steen Eden)   Edema of both lower extremities due to peripheral venous insufficiency    Varicosities bilat, swelling L>R.  R baker's cyst.  Got vein clinic eval 02/2018->sclerotherapy recommended but since no hemorrhage or ulceration, insurance will not cover the procedure.   Family history of adverse reaction to anesthesia    mother died when patient age 80 receiving anesthesia for thyroid  surgery   Fibrocystic breast disease    w/fibroadenoma.  Bx's showed NO atypia (Dr. Linell Rhymes).  Mammo neg 2008.   GERD (gastroesophageal reflux disease)    History of double vision    Ophthalmologist, Dr. Lasandra Points, is further evaluating this with MRI  orbits  and limited brain (myesthenia gravis testing neg 07/2010)   History of home oxygen  therapy    uses 2 liters at hs   Hypertension    EKG 03/2010 normal   Hypothyroidism    Hashimoto's, dx'd 1989   Idiopathic peripheral neuropathy 04/2018   Sensory (numb feet) S1 distribution bilat, c/w spinal stenosis sensory neuropathy (no pain). 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  Labs  [place], MRI C spine->some spondylosis/stenosis.  UE NCS/EMS: normal 04/2019.   Lesion of right native kidney 2017   found on CT 02/2015.  F/u MRI 03/31/15 showed it to be smaller and less likely of any significance but repeat CT renal protocol in 76mo was recommended by radiology.  This was done 10/26/15 and showed no interval change in the lesion, but b/c the lesion enhances with contrast, radiol rec'd urol consult (b/c pap renal RCC could not be excluded).  Saw urol 03/06/16--stable on u/s 09/2016 and 09/2017.   OAB (overactive bladder)    Osteoarthritis, hip, bilateral    she is s/p bilat THA as of 02/2018.   Osteoporosis    radius, T score -2.5->rec'd alendronate  08/2019->plan rpt DEXA 08/2021.   Pneumonia    Postmenopausal atrophic vaginitis    improved with estrace 2 X/week.   Prolapse of vaginal cuff after hysterectomy    10/2018: Dr. Zackary Heron. Mild colpocele 04/2019 per GYN (no surgery)   Pulmonary nodule    CT chest 12/10, June 2011.  No nodule on CT 06/2010.   Recurrent Clostridium difficile diarrhea    Episode 09/19/2016: resolved with prolonged course of flagyl .  11/2016 recurrence treated with 10d of Dificid (Fidaxomicin) by GI (Dr. Andriette Keeling).   Recurrent UTI    likely related to pt's atrophic vaginitis.  Urol started vaginal estrace 03/2016.   Rib fracture 09/2018   R 7th, laterally-->sustained when her dog pulled her over and she fell.   RLS (restless legs syndrome)    neuro->gabapentin  trial 2020/21   Shingles 02/2014   R abd/flank   Spinal stenosis of lumbar region with neurogenic claudication    2008 MRI (L3-4, L4-5, L5-S1).  +S1 distribution sensory loss bilat    Past Surgical History:  Procedure Laterality Date   ABDOMINAL HYSTERECTOMY  1978   no BSO per pt--nonmalignant reasons   APPENDECTOMY     BREAST CYST EXCISION     Last screening mammogram 10/2010 was normal.   CHOLECYSTECTOMY  2001   COLONOSCOPY  04/2005; 12/06/2015   2007 NORMAL.  2017 adenomatous polyp x 1.  Mod sigmoid  diverticulosis (Dr. Andriette Keeling).     DEXA  05/18/2017; 08/7019   2019 NORMAL (T-score 0.1).  08/2019->T score -2.5->alendronate  started.  03/2022 T score -2.7   HIP CLOSED REDUCTION Right 12/25/2015   Procedure: CLOSED REDUCTION HIP;  Surgeon: Janeth Medicus, MD;  Location: [facility];  Service: Orthopedics;  Laterality: Right;   NCV/EMS  10/2018   chronic and symmetric sensorimotor axonal polyneuropathy affecting the lower extremities   TONSILLECTOMY     TOTAL HIP ARTHROPLASTY Right 11/01/2014   Procedure: RIGHT TOTAL HIP ARTHROPLASTY;  Surgeon: Hazle Lites, MD;  Location: [facility];  Service: Orthopedics;  Laterality: Right;   TOTAL HIP ARTHROPLASTY Left 12/08/2017   Procedure: LEFT TOTAL HIP ARTHROPLASTY ANTERIOR APPROACH;  Surgeon: Claiborne Crew, MD;  Location: [facility];  Service: Orthopedics;  Laterality: Left;  70 mins   TOTAL HIP REVISION Right 02/10/2023   Procedure: RIGHT TOTAL HIP REVISION;  Surgeon: Claiborne Crew, MD;  Location: [facility];  Service: Orthopedics;  Laterality: Right;   TRANSTHORACIC ECHOCARDIOGRAM  08/2014   2016 EF 60-65%, grade I diast dysfxn. 06/21/21 unchanged.   TUBAL LIGATION  1974    Outpatient Medications Prior to Visit  Medication Sig Dispense Refill   acetaminophen  (TYLENOL ) 325 MG tablet Take 2 tablets (650 mg total) by mouth every 6 (six) hours.     albuterol  (PROAIR  HFA) 108 (90 Base) MCG/ACT inhaler Inhale 2 puffs into the lungs every 4 (four) hours as needed for wheezing or shortness of breath. 17 each 1   albuterol  (PROVENTIL ) (2.5 MG/3ML) 0.083% nebulizer solution Take 2.5 mg by nebulization every 6 (six) hours as needed for wheezing or shortness of breath.     amLODipine  (NORVASC ) 10 MG tablet Take 1 tablet (10 mg total) by mouth daily. 30 tablet 1   Fluticasone -Umeclidin-Vilant (TRELEGY ELLIPTA ) 100-62.5-25 MCG/ACT AEPB Inhale 1 puff into the lungs daily. 180 each 3   levothyroxine  (SYNTHROID ) 100 MCG tablet TAKE 1 TABLET BY MOUTH EVERY DAY 90 tablet 0    lidocaine  (XYLOCAINE ) 2 % solution Apply 1/2 ml to affected area of mouth 3 times per day as needed for pain 20 mL 2   Misc. Devices (PULSE OXIMETER) MISC Check oxygen  level as needed 1 each 1   pantoprazole  (PROTONIX ) 40 MG tablet Take 1 tablet (40 mg total) by mouth 2 (two) times daily. 180 tablet 1   venlafaxine  (EFFEXOR ) 50 MG tablet Take 50 mg by mouth 2 (two) times daily.     vitamin B-12 (CYANOCOBALAMIN ) 100 MCG tablet Take 100 mcg by mouth daily.     VITAMIN D  PO Take 1 tablet by mouth daily.     fluticasone  (FLONASE ) 50 MCG/ACT nasal spray Place 2 sprays into both nostrils daily. 16 g 6   predniSONE  (DELTASONE ) 10 MG tablet Take 4 tablets (40 mg total) by mouth daily. 16 tablet 0   No facility-administered medications prior to visit.    Allergies  Allergen Reactions   Losartan  Other (See Comments)    hyperkalemia   Penicillins Hives, Swelling and Rash    Has patient had a PCN reaction causing immediate rash, facial/tongue/throat swelling, SOB or lightheadedness with hypotension: YES Has patient had a PCN reaction causing severe rash involving mucus membranes or skin necrosis: NO Has patient had a PCN reaction that required hospitalization NO Has patient had a PCN reaction occurring within the last 10 years: NO If all of the above answers are "NO", then may proceed with Cephalosporin use.    Cefixime [Kdc:Cefixime] Other (See Comments)    unspecified   Gabapentin  Other (See Comments)    Feels woozy   Indocin [Indomethacin] Other (See Comments)    Painful tongue and throat   Singulair  [Montelukast  Sodium]     Chest tightness, decreased mental clarity   Sulfa Antibiotics Other (See Comments)    unspecified   Ace Inhibitors Other (See Comments)    cough   Statins Other (See Comments)    Myalgias     Review of Systems As per HPI  PE:    05/28/2023   11:10 AM 05/21/2023    4:26 PM 05/21/2023    4:24 PM  Vitals with BMI  Height 5\' 2"  5\' 2"    Weight 117 lbs 13 oz 119  lbs 1 oz   BMI 21.54 21.77   Systolic 132  141  Diastolic 79  76  Pulse 73  76  02 sat 91% on 2L oxygen   Physical Exam  Gen: Alert, well appearing.  Patient is oriented to person, place, time, and situation. ZOX:WRUE: no injection, icteris, swelling, or exudate.  EOMI, PERRLA. Mouth: lips without lesion/swelling.  Oral mucosa pink and moist. Oropharynx without erythema, exudate, or swelling.  Cardiovascular: Regular rhythm and rate without murmur. Lungs: Mildly decreased aeration diffusely, wheezing diffusely, mild prolongation of expiratory phase.  Breathing is nonlabored. Extremities: No edema  LABS:  Last CBC Lab Results  Component Value Date   WBC 7.7 05/21/2023   HGB 12.0 05/21/2023   HCT 38.5 05/21/2023   MCV 94.4 05/21/2023   MCH 29.4 05/21/2023   RDW 12.8 05/21/2023   PLT 264 05/21/2023   Last metabolic panel Lab Results  Component Value Date   GLUCOSE 108 (H) 05/21/2023   NA 141 05/21/2023   K 3.8 05/21/2023   CL 107 05/21/2023   CO2 25 05/21/2023   BUN 19 05/21/2023   CREATININE 0.55 05/21/2023   GFRNONAA >60 05/21/2023   CALCIUM  9.2 05/21/2023   PHOS 2.9 02/14/2023   PROT 6.1 (L) 10/29/2021   ALBUMIN 3.1 (L) 10/29/2021   BILITOT 0.4 10/29/2021   ALKPHOS 58 10/29/2021   AST 18 10/29/2021   ALT 42 10/29/2021   ANIONGAP 9 05/21/2023   Lab Results  Component Value Date   DDIMER 0.43 04/29/2021   IMPRESSION AND PLAN:  #1 COPD exacerbation. Prednisone  40 daily x 4 days, 30 daily x 4 days, 20 daily x 4 days, then 10 daily x 4 days. Azithromycin  x 5 days. Albuterol  nebulizer 2.5 mg every 6 hours as needed. Continue 2 to 3 L of oxygen  24/7.  2.  Allergic rhinitis. Restart Flonase  2 sprays each nostril daily--> prescription sent. Avoid antihistamines and decongestants.  An After Visit Summary was printed and given to the patient.  FOLLOW UP: Return in about 1 week (around 06/04/2023) for f/u copd.  Signed:  Arletha Lady, MD           05/28/2023

## 2023-05-29 DIAGNOSIS — M48061 Spinal stenosis, lumbar region without neurogenic claudication: Secondary | ICD-10-CM | POA: Diagnosis not present

## 2023-05-29 DIAGNOSIS — J449 Chronic obstructive pulmonary disease, unspecified: Secondary | ICD-10-CM | POA: Diagnosis not present

## 2023-05-29 DIAGNOSIS — R0902 Hypoxemia: Secondary | ICD-10-CM | POA: Diagnosis not present

## 2023-05-29 DIAGNOSIS — R0989 Other specified symptoms and signs involving the circulatory and respiratory systems: Secondary | ICD-10-CM | POA: Diagnosis not present

## 2023-05-29 DIAGNOSIS — I2510 Atherosclerotic heart disease of native coronary artery without angina pectoris: Secondary | ICD-10-CM | POA: Diagnosis not present

## 2023-05-29 DIAGNOSIS — I739 Peripheral vascular disease, unspecified: Secondary | ICD-10-CM | POA: Diagnosis not present

## 2023-05-29 DIAGNOSIS — R509 Fever, unspecified: Secondary | ICD-10-CM | POA: Diagnosis not present

## 2023-05-29 DIAGNOSIS — J9611 Chronic respiratory failure with hypoxia: Secondary | ICD-10-CM | POA: Diagnosis not present

## 2023-05-29 DIAGNOSIS — M7981 Nontraumatic hematoma of soft tissue: Secondary | ICD-10-CM | POA: Diagnosis not present

## 2023-05-29 DIAGNOSIS — R0689 Other abnormalities of breathing: Secondary | ICD-10-CM | POA: Diagnosis not present

## 2023-05-29 DIAGNOSIS — T84020D Dislocation of internal right hip prosthesis, subsequent encounter: Secondary | ICD-10-CM | POA: Diagnosis not present

## 2023-05-29 DIAGNOSIS — I129 Hypertensive chronic kidney disease with stage 1 through stage 4 chronic kidney disease, or unspecified chronic kidney disease: Secondary | ICD-10-CM | POA: Diagnosis not present

## 2023-05-29 DIAGNOSIS — R059 Cough, unspecified: Secondary | ICD-10-CM | POA: Diagnosis not present

## 2023-05-29 DIAGNOSIS — N189 Chronic kidney disease, unspecified: Secondary | ICD-10-CM | POA: Diagnosis not present

## 2023-05-30 ENCOUNTER — Emergency Department (HOSPITAL_COMMUNITY)

## 2023-05-30 ENCOUNTER — Inpatient Hospital Stay (HOSPITAL_COMMUNITY)
Admission: EM | Admit: 2023-05-30 | Discharge: 2023-06-17 | DRG: 871 | Disposition: A | Discharge status: Skilled Nursing Facility | Attending: Internal Medicine | Admitting: Internal Medicine

## 2023-05-30 ENCOUNTER — Other Ambulatory Visit: Payer: Self-pay

## 2023-05-30 ENCOUNTER — Encounter (HOSPITAL_COMMUNITY): Payer: Self-pay | Admitting: Internal Medicine

## 2023-05-30 DIAGNOSIS — L8990 Pressure ulcer of unspecified site, unspecified stage: Secondary | ICD-10-CM | POA: Insufficient documentation

## 2023-05-30 DIAGNOSIS — F419 Anxiety disorder, unspecified: Secondary | ICD-10-CM | POA: Diagnosis present

## 2023-05-30 DIAGNOSIS — J440 Chronic obstructive pulmonary disease with acute lower respiratory infection: Secondary | ICD-10-CM | POA: Diagnosis present

## 2023-05-30 DIAGNOSIS — G2581 Restless legs syndrome: Secondary | ICD-10-CM | POA: Diagnosis present

## 2023-05-30 DIAGNOSIS — I130 Hypertensive heart and chronic kidney disease with heart failure and stage 1 through stage 4 chronic kidney disease, or unspecified chronic kidney disease: Secondary | ICD-10-CM | POA: Diagnosis present

## 2023-05-30 DIAGNOSIS — Z881 Allergy status to other antibiotic agents status: Secondary | ICD-10-CM

## 2023-05-30 DIAGNOSIS — I4892 Unspecified atrial flutter: Secondary | ICD-10-CM | POA: Diagnosis not present

## 2023-05-30 DIAGNOSIS — Z7189 Other specified counseling: Secondary | ICD-10-CM | POA: Diagnosis not present

## 2023-05-30 DIAGNOSIS — I872 Venous insufficiency (chronic) (peripheral): Secondary | ICD-10-CM | POA: Diagnosis present

## 2023-05-30 DIAGNOSIS — E876 Hypokalemia: Secondary | ICD-10-CM | POA: Diagnosis present

## 2023-05-30 DIAGNOSIS — D72829 Elevated white blood cell count, unspecified: Secondary | ICD-10-CM | POA: Diagnosis not present

## 2023-05-30 DIAGNOSIS — Z8249 Family history of ischemic heart disease and other diseases of the circulatory system: Secondary | ICD-10-CM

## 2023-05-30 DIAGNOSIS — Z87440 Personal history of urinary (tract) infections: Secondary | ICD-10-CM

## 2023-05-30 DIAGNOSIS — I4891 Unspecified atrial fibrillation: Secondary | ICD-10-CM | POA: Diagnosis not present

## 2023-05-30 DIAGNOSIS — I4719 Other supraventricular tachycardia: Secondary | ICD-10-CM | POA: Diagnosis present

## 2023-05-30 DIAGNOSIS — Z886 Allergy status to analgesic agent status: Secondary | ICD-10-CM

## 2023-05-30 DIAGNOSIS — R262 Difficulty in walking, not elsewhere classified: Secondary | ICD-10-CM | POA: Diagnosis not present

## 2023-05-30 DIAGNOSIS — E063 Autoimmune thyroiditis: Secondary | ICD-10-CM | POA: Diagnosis present

## 2023-05-30 DIAGNOSIS — F32A Depression, unspecified: Secondary | ICD-10-CM | POA: Diagnosis present

## 2023-05-30 DIAGNOSIS — J189 Pneumonia, unspecified organism: Secondary | ICD-10-CM | POA: Diagnosis not present

## 2023-05-30 DIAGNOSIS — M954 Acquired deformity of chest and rib: Secondary | ICD-10-CM | POA: Diagnosis present

## 2023-05-30 DIAGNOSIS — A419 Sepsis, unspecified organism: Secondary | ICD-10-CM | POA: Diagnosis not present

## 2023-05-30 DIAGNOSIS — J9601 Acute respiratory failure with hypoxia: Secondary | ICD-10-CM | POA: Diagnosis present

## 2023-05-30 DIAGNOSIS — N3021 Other chronic cystitis with hematuria: Secondary | ICD-10-CM | POA: Diagnosis present

## 2023-05-30 DIAGNOSIS — L89893 Pressure ulcer of other site, stage 3: Secondary | ICD-10-CM | POA: Diagnosis not present

## 2023-05-30 DIAGNOSIS — R54 Age-related physical debility: Secondary | ICD-10-CM | POA: Diagnosis present

## 2023-05-30 DIAGNOSIS — E0590 Thyrotoxicosis, unspecified without thyrotoxic crisis or storm: Secondary | ICD-10-CM | POA: Diagnosis present

## 2023-05-30 DIAGNOSIS — N6019 Diffuse cystic mastopathy of unspecified breast: Secondary | ICD-10-CM | POA: Diagnosis present

## 2023-05-30 DIAGNOSIS — Z8701 Personal history of pneumonia (recurrent): Secondary | ICD-10-CM

## 2023-05-30 DIAGNOSIS — I272 Pulmonary hypertension, unspecified: Secondary | ICD-10-CM | POA: Diagnosis present

## 2023-05-30 DIAGNOSIS — I700 Atherosclerosis of aorta: Secondary | ICD-10-CM | POA: Diagnosis not present

## 2023-05-30 DIAGNOSIS — Z471 Aftercare following joint replacement surgery: Secondary | ICD-10-CM | POA: Diagnosis not present

## 2023-05-30 DIAGNOSIS — B370 Candidal stomatitis: Secondary | ICD-10-CM | POA: Diagnosis present

## 2023-05-30 DIAGNOSIS — Z9981 Dependence on supplemental oxygen: Secondary | ICD-10-CM

## 2023-05-30 DIAGNOSIS — I071 Rheumatic tricuspid insufficiency: Secondary | ICD-10-CM | POA: Diagnosis present

## 2023-05-30 DIAGNOSIS — Z96643 Presence of artificial hip joint, bilateral: Secondary | ICD-10-CM | POA: Diagnosis present

## 2023-05-30 DIAGNOSIS — N182 Chronic kidney disease, stage 2 (mild): Secondary | ICD-10-CM | POA: Diagnosis present

## 2023-05-30 DIAGNOSIS — J9622 Acute and chronic respiratory failure with hypercapnia: Secondary | ICD-10-CM | POA: Diagnosis not present

## 2023-05-30 DIAGNOSIS — Z882 Allergy status to sulfonamides status: Secondary | ICD-10-CM

## 2023-05-30 DIAGNOSIS — F0390 Unspecified dementia without behavioral disturbance: Secondary | ICD-10-CM | POA: Diagnosis not present

## 2023-05-30 DIAGNOSIS — R001 Bradycardia, unspecified: Secondary | ICD-10-CM | POA: Diagnosis not present

## 2023-05-30 DIAGNOSIS — J9611 Chronic respiratory failure with hypoxia: Secondary | ICD-10-CM | POA: Diagnosis not present

## 2023-05-30 DIAGNOSIS — I5033 Acute on chronic diastolic (congestive) heart failure: Secondary | ICD-10-CM | POA: Diagnosis present

## 2023-05-30 DIAGNOSIS — M810 Age-related osteoporosis without current pathological fracture: Secondary | ICD-10-CM | POA: Diagnosis present

## 2023-05-30 DIAGNOSIS — J449 Chronic obstructive pulmonary disease, unspecified: Secondary | ICD-10-CM | POA: Diagnosis not present

## 2023-05-30 DIAGNOSIS — Z79890 Hormone replacement therapy (postmenopausal): Secondary | ICD-10-CM

## 2023-05-30 DIAGNOSIS — Z7901 Long term (current) use of anticoagulants: Secondary | ICD-10-CM

## 2023-05-30 DIAGNOSIS — G609 Hereditary and idiopathic neuropathy, unspecified: Secondary | ICD-10-CM | POA: Diagnosis present

## 2023-05-30 DIAGNOSIS — I480 Paroxysmal atrial fibrillation: Secondary | ICD-10-CM | POA: Diagnosis present

## 2023-05-30 DIAGNOSIS — J129 Viral pneumonia, unspecified: Secondary | ICD-10-CM | POA: Diagnosis not present

## 2023-05-30 DIAGNOSIS — J90 Pleural effusion, not elsewhere classified: Secondary | ICD-10-CM | POA: Diagnosis not present

## 2023-05-30 DIAGNOSIS — Z7401 Bed confinement status: Secondary | ICD-10-CM | POA: Diagnosis not present

## 2023-05-30 DIAGNOSIS — J9600 Acute respiratory failure, unspecified whether with hypoxia or hypercapnia: Secondary | ICD-10-CM | POA: Diagnosis not present

## 2023-05-30 DIAGNOSIS — R41841 Cognitive communication deficit: Secondary | ICD-10-CM | POA: Diagnosis not present

## 2023-05-30 DIAGNOSIS — Z9049 Acquired absence of other specified parts of digestive tract: Secondary | ICD-10-CM

## 2023-05-30 DIAGNOSIS — Z1152 Encounter for screening for COVID-19: Secondary | ICD-10-CM

## 2023-05-30 DIAGNOSIS — K219 Gastro-esophageal reflux disease without esophagitis: Secondary | ICD-10-CM | POA: Diagnosis present

## 2023-05-30 DIAGNOSIS — Z8619 Personal history of other infectious and parasitic diseases: Secondary | ICD-10-CM

## 2023-05-30 DIAGNOSIS — B348 Other viral infections of unspecified site: Secondary | ICD-10-CM | POA: Diagnosis not present

## 2023-05-30 DIAGNOSIS — Z87891 Personal history of nicotine dependence: Secondary | ICD-10-CM

## 2023-05-30 DIAGNOSIS — I471 Supraventricular tachycardia, unspecified: Secondary | ICD-10-CM

## 2023-05-30 DIAGNOSIS — J1289 Other viral pneumonia: Secondary | ICD-10-CM | POA: Diagnosis not present

## 2023-05-30 DIAGNOSIS — N3281 Overactive bladder: Secondary | ICD-10-CM | POA: Diagnosis present

## 2023-05-30 DIAGNOSIS — J441 Chronic obstructive pulmonary disease with (acute) exacerbation: Secondary | ICD-10-CM | POA: Diagnosis present

## 2023-05-30 DIAGNOSIS — E871 Hypo-osmolality and hyponatremia: Secondary | ICD-10-CM | POA: Diagnosis present

## 2023-05-30 DIAGNOSIS — Z515 Encounter for palliative care: Secondary | ICD-10-CM | POA: Diagnosis not present

## 2023-05-30 DIAGNOSIS — I5031 Acute diastolic (congestive) heart failure: Secondary | ICD-10-CM

## 2023-05-30 DIAGNOSIS — Z79899 Other long term (current) drug therapy: Secondary | ICD-10-CM

## 2023-05-30 DIAGNOSIS — J9621 Acute and chronic respiratory failure with hypoxia: Secondary | ICD-10-CM | POA: Diagnosis present

## 2023-05-30 DIAGNOSIS — I517 Cardiomegaly: Secondary | ICD-10-CM | POA: Diagnosis not present

## 2023-05-30 DIAGNOSIS — M24451 Recurrent dislocation, right hip: Secondary | ICD-10-CM | POA: Diagnosis not present

## 2023-05-30 DIAGNOSIS — M1610 Unilateral primary osteoarthritis, unspecified hip: Secondary | ICD-10-CM | POA: Diagnosis present

## 2023-05-30 DIAGNOSIS — R918 Other nonspecific abnormal finding of lung field: Secondary | ICD-10-CM | POA: Diagnosis not present

## 2023-05-30 DIAGNOSIS — R0600 Dyspnea, unspecified: Secondary | ICD-10-CM | POA: Diagnosis not present

## 2023-05-30 DIAGNOSIS — Z888 Allergy status to other drugs, medicaments and biological substances status: Secondary | ICD-10-CM

## 2023-05-30 DIAGNOSIS — R0602 Shortness of breath: Secondary | ICD-10-CM | POA: Diagnosis present

## 2023-05-30 DIAGNOSIS — Z7951 Long term (current) use of inhaled steroids: Secondary | ICD-10-CM

## 2023-05-30 DIAGNOSIS — R2681 Unsteadiness on feet: Secondary | ICD-10-CM | POA: Diagnosis not present

## 2023-05-30 DIAGNOSIS — Z634 Disappearance and death of family member: Secondary | ICD-10-CM

## 2023-05-30 DIAGNOSIS — R1310 Dysphagia, unspecified: Secondary | ICD-10-CM | POA: Diagnosis not present

## 2023-05-30 DIAGNOSIS — J439 Emphysema, unspecified: Secondary | ICD-10-CM | POA: Diagnosis not present

## 2023-05-30 DIAGNOSIS — I959 Hypotension, unspecified: Secondary | ICD-10-CM | POA: Diagnosis present

## 2023-05-30 DIAGNOSIS — Z90710 Acquired absence of both cervix and uterus: Secondary | ICD-10-CM

## 2023-05-30 DIAGNOSIS — M6281 Muscle weakness (generalized): Secondary | ICD-10-CM | POA: Diagnosis not present

## 2023-05-30 DIAGNOSIS — Z880 Allergy status to penicillin: Secondary | ICD-10-CM

## 2023-05-30 DIAGNOSIS — I493 Ventricular premature depolarization: Secondary | ICD-10-CM | POA: Diagnosis not present

## 2023-05-30 DIAGNOSIS — N952 Postmenopausal atrophic vaginitis: Secondary | ICD-10-CM | POA: Diagnosis present

## 2023-05-30 LAB — BLOOD GAS, VENOUS
Acid-Base Excess: 0.3 mmol/L (ref 0.0–2.0)
Bicarbonate: 27.3 mmol/L (ref 20.0–28.0)
O2 Saturation: 65.6 %
Patient temperature: 37
pCO2, Ven: 53 mmHg (ref 44–60)
pH, Ven: 7.32 (ref 7.25–7.43)
pO2, Ven: 36 mmHg (ref 32–45)

## 2023-05-30 LAB — RESP PANEL BY RT-PCR (RSV, FLU A&B, COVID)  RVPGX2
Influenza A by PCR: NEGATIVE
Influenza B by PCR: NEGATIVE
Resp Syncytial Virus by PCR: NEGATIVE
SARS Coronavirus 2 by RT PCR: NEGATIVE

## 2023-05-30 LAB — CBC WITH DIFFERENTIAL/PLATELET
Abs Immature Granulocytes: 0.07 10*3/uL (ref 0.00–0.07)
Basophils Absolute: 0.1 10*3/uL (ref 0.0–0.1)
Basophils Relative: 0 %
Eosinophils Absolute: 0 10*3/uL (ref 0.0–0.5)
Eosinophils Relative: 0 %
HCT: 34.2 % — ABNORMAL LOW (ref 36.0–46.0)
Hemoglobin: 11.2 g/dL — ABNORMAL LOW (ref 12.0–15.0)
Immature Granulocytes: 0 %
Lymphocytes Relative: 3 %
Lymphs Abs: 0.6 10*3/uL — ABNORMAL LOW (ref 0.7–4.0)
MCH: 29.9 pg (ref 26.0–34.0)
MCHC: 32.7 g/dL (ref 30.0–36.0)
MCV: 91.2 fL (ref 80.0–100.0)
Monocytes Absolute: 1.2 10*3/uL — ABNORMAL HIGH (ref 0.1–1.0)
Monocytes Relative: 6 %
Neutro Abs: 16.8 10*3/uL — ABNORMAL HIGH (ref 1.7–7.7)
Neutrophils Relative %: 91 %
Platelets: 269 10*3/uL (ref 150–400)
RBC: 3.75 MIL/uL — ABNORMAL LOW (ref 3.87–5.11)
RDW: 13.1 % (ref 11.5–15.5)
WBC: 18.8 10*3/uL — ABNORMAL HIGH (ref 4.0–10.5)
nRBC: 0 % (ref 0.0–0.2)

## 2023-05-30 LAB — COMPREHENSIVE METABOLIC PANEL WITH GFR
ALT: 21 U/L (ref 0–44)
AST: 19 U/L (ref 15–41)
Albumin: 3.1 g/dL — ABNORMAL LOW (ref 3.5–5.0)
Alkaline Phosphatase: 66 U/L (ref 38–126)
Anion gap: 13 (ref 5–15)
BUN: 24 mg/dL — ABNORMAL HIGH (ref 8–23)
CO2: 24 mmol/L (ref 22–32)
Calcium: 8.5 mg/dL — ABNORMAL LOW (ref 8.9–10.3)
Chloride: 99 mmol/L (ref 98–111)
Creatinine, Ser: 0.94 mg/dL (ref 0.44–1.00)
GFR, Estimated: 59 mL/min — ABNORMAL LOW (ref >60)
Glucose, Bld: 153 mg/dL — ABNORMAL HIGH (ref 70–99)
Potassium: 3.7 mmol/L (ref 3.5–5.1)
Sodium: 136 mmol/L (ref 135–145)
Total Bilirubin: 1 mg/dL (ref 0.0–1.2)
Total Protein: 6.1 g/dL — ABNORMAL LOW (ref 6.5–8.1)

## 2023-05-30 LAB — URINALYSIS, W/ REFLEX TO CULTURE (INFECTION SUSPECTED)
Bilirubin Urine: NEGATIVE
Glucose, UA: NEGATIVE mg/dL
Ketones, ur: NEGATIVE mg/dL
Nitrite: NEGATIVE
Protein, ur: NEGATIVE mg/dL
Specific Gravity, Urine: 1.01 (ref 1.005–1.030)
pH: 5 (ref 5.0–8.0)

## 2023-05-30 LAB — I-STAT CG4 LACTIC ACID, ED: Lactic Acid, Venous: 1 mmol/L (ref 0.5–1.9)

## 2023-05-30 LAB — PROTIME-INR
INR: 1.1 (ref 0.8–1.2)
Prothrombin Time: 14.1 s (ref 11.4–15.2)

## 2023-05-30 MED ORDER — LEVOTHYROXINE SODIUM 100 MCG PO TABS
100.0000 ug | ORAL_TABLET | Freq: Every day | ORAL | Status: DC
  Administered 2023-05-30 – 2023-05-31 (×2): 100 ug via ORAL
  Filled 2023-05-30 (×2): qty 1

## 2023-05-30 MED ORDER — LACTATED RINGERS IV SOLN: INTRAVENOUS | Status: AC

## 2023-05-30 MED ORDER — IPRATROPIUM BROMIDE 0.02 % IN SOLN
RESPIRATORY_TRACT | Status: AC
  Filled 2023-05-30: qty 2.5

## 2023-05-30 MED ORDER — FLUTICASONE PROPIONATE 50 MCG/ACT NA SUSP
2.0000 | Freq: Every day | NASAL | Status: DC
  Administered 2023-05-30 – 2023-06-17 (×18): 2 via NASAL
  Filled 2023-05-30: qty 16

## 2023-05-30 MED ORDER — ALBUTEROL SULFATE (2.5 MG/3ML) 0.083% IN NEBU
2.5000 mg | INHALATION_SOLUTION | Freq: Once | RESPIRATORY_TRACT | Status: AC
  Administered 2023-05-30: 2.5 mg via RESPIRATORY_TRACT

## 2023-05-30 MED ORDER — LACTATED RINGERS IV BOLUS
500.0000 mL | Freq: Once | INTRAVENOUS | Status: AC
  Administered 2023-05-30: 500 mL via INTRAVENOUS

## 2023-05-30 MED ORDER — VANCOMYCIN HCL IN DEXTROSE 1-5 GM/200ML-% IV SOLN
1000.0000 mg | Freq: Once | INTRAVENOUS | Status: AC
  Administered 2023-05-30: 1000 mg via INTRAVENOUS
  Filled 2023-05-30: qty 200

## 2023-05-30 MED ORDER — ONDANSETRON HCL 4 MG PO TABS: 4.0000 mg | ORAL_TABLET | Freq: Four times a day (QID) | ORAL | Status: DC | PRN

## 2023-05-30 MED ORDER — ACETAMINOPHEN 325 MG PO TABS
650.0000 mg | ORAL_TABLET | Freq: Four times a day (QID) | ORAL | Status: DC | PRN
  Administered 2023-06-04 – 2023-06-16 (×7): 650 mg via ORAL
  Filled 2023-05-30 (×7): qty 2

## 2023-05-30 MED ORDER — BUDESON-GLYCOPYRROL-FORMOTEROL 160-9-4.8 MCG/ACT IN AERO
2.0000 | INHALATION_SPRAY | Freq: Two times a day (BID) | RESPIRATORY_TRACT | Status: DC
  Administered 2023-05-30: 2 via RESPIRATORY_TRACT
  Filled 2023-05-30: qty 5.9

## 2023-05-30 MED ORDER — ENSURE ENLIVE PO LIQD: 237.0000 mL | Freq: Two times a day (BID) | ORAL | Status: DC

## 2023-05-30 MED ORDER — ACETAMINOPHEN 500 MG PO TABS
1000.0000 mg | ORAL_TABLET | Freq: Once | ORAL | Status: AC
  Administered 2023-05-30: 1000 mg via ORAL
  Filled 2023-05-30: qty 2

## 2023-05-30 MED ORDER — IPRATROPIUM-ALBUTEROL 0.5-2.5 (3) MG/3ML IN SOLN
3.0000 mL | Freq: Once | RESPIRATORY_TRACT | Status: AC
  Administered 2023-05-30: 3 mL via RESPIRATORY_TRACT

## 2023-05-30 MED ORDER — ALBUTEROL SULFATE (2.5 MG/3ML) 0.083% IN NEBU
INHALATION_SOLUTION | RESPIRATORY_TRACT | Status: AC
  Filled 2023-05-30: qty 6

## 2023-05-30 MED ORDER — ENOXAPARIN SODIUM 30 MG/0.3ML IJ SOSY
30.0000 mg | PREFILLED_SYRINGE | INTRAMUSCULAR | Status: DC
  Administered 2023-05-30: 30 mg via SUBCUTANEOUS
  Filled 2023-05-30: qty 0.3

## 2023-05-30 MED ORDER — ALBUTEROL SULFATE (2.5 MG/3ML) 0.083% IN NEBU: 5.0000 mg | INHALATION_SOLUTION | Freq: Once | RESPIRATORY_TRACT | Status: DC

## 2023-05-30 MED ORDER — ALBUTEROL SULFATE (2.5 MG/3ML) 0.083% IN NEBU
2.5000 mg | INHALATION_SOLUTION | RESPIRATORY_TRACT | Status: DC | PRN
  Administered 2023-05-30 (×2): 2.5 mg via RESPIRATORY_TRACT
  Filled 2023-05-30 (×2): qty 3

## 2023-05-30 MED ORDER — VENLAFAXINE HCL 25 MG PO TABS
50.0000 mg | ORAL_TABLET | Freq: Two times a day (BID) | ORAL | Status: DC
  Administered 2023-05-30 – 2023-06-17 (×35): 50 mg via ORAL
  Filled 2023-05-30: qty 1
  Filled 2023-05-30 (×36): qty 2
  Filled 2023-05-30: qty 1

## 2023-05-30 MED ORDER — SODIUM CHLORIDE 0.9 % IV SOLN
100.0000 mg | Freq: Two times a day (BID) | INTRAVENOUS | Status: DC
  Administered 2023-05-30 – 2023-05-31 (×3): 100 mg via INTRAVENOUS
  Filled 2023-05-30 (×4): qty 100

## 2023-05-30 MED ORDER — METRONIDAZOLE 500 MG/100ML IV SOLN
500.0000 mg | Freq: Once | INTRAVENOUS | Status: AC
  Administered 2023-05-30: 500 mg via INTRAVENOUS
  Filled 2023-05-30: qty 100

## 2023-05-30 MED ORDER — ACETAMINOPHEN 650 MG RE SUPP: 650.0000 mg | Freq: Four times a day (QID) | RECTAL | Status: DC | PRN

## 2023-05-30 MED ORDER — SODIUM CHLORIDE 0.9 % IV SOLN
2.0000 g | Freq: Once | INTRAVENOUS | Status: AC
  Administered 2023-05-30: 2 g via INTRAVENOUS
  Filled 2023-05-30: qty 10

## 2023-05-30 MED ORDER — PANTOPRAZOLE SODIUM 40 MG PO TBEC
40.0000 mg | DELAYED_RELEASE_TABLET | Freq: Two times a day (BID) | ORAL | Status: DC
  Administered 2023-05-30 – 2023-06-17 (×36): 40 mg via ORAL
  Filled 2023-05-30 (×36): qty 1

## 2023-05-30 MED ORDER — ONDANSETRON HCL 4 MG/2ML IJ SOLN: 4.0000 mg | Freq: Four times a day (QID) | INTRAMUSCULAR | Status: DC | PRN

## 2023-05-30 NOTE — Progress Notes (Signed)
   05/30/23 2237  BiPAP/CPAP/SIPAP  BiPAP/CPAP/SIPAP Pt Type Adult  BiPAP/CPAP/SIPAP SERVO  Mask Type Full face mask  Dentures removed? Yes - Placed in denture cup  Mask Size Small  Set Rate 15 breaths/min  Respiratory Rate 38 breaths/min  IPAP 12 cmH20  EPAP 6 cmH2O  Pressure Support 6 cmH20  FiO2 (%) 70 %  Minute Ventilation 10.3  Leak 75  Peak Inspiratory Pressure (PIP) 15  Tidal Volume (Vt) 445  Patient Home Machine No  Patient Home Mask No  Patient Home Tubing No  Auto Titrate No  Press High Alarm 25 cmH2O  Device Plugged into RED Power Outlet Yes  Oxygen  Percent 70 %  BiPAP/CPAP /SiPAP Vitals  Pulse Rate (!) 118  Resp (!) 38  SpO2 91 %  Bilateral Breath Sounds Expiratory wheezes  MEWS Score/Color  MEWS Score 5  MEWS Score Color Red

## 2023-05-30 NOTE — Progress Notes (Signed)
   05/30/23 1003  BiPAP/CPAP/SIPAP  Reason BIPAP/CPAP not in use (S)   (Placed on standby: applied 3 L [place] post transport, 97%.)

## 2023-05-30 NOTE — ED Provider Notes (Signed)
 [place] EMERGENCY DEPARTMENT AT [facility] Provider Note   CSN: [id number] Arrival date & time: 05/30/23  0009     History  Chief Complaint  Patient presents with   Shortness of Breath    Priscilla Cantrell is a 88 y.o. female.  The history is provided by the patient and a relative.  Shortness of Breath Severity:  Severe Onset quality:  Gradual Duration:  2 weeks Timing:  Constant Progression:  Worsening Chronicity:  Recurrent Context: not activity   Relieved by:  Nothing Worsened by:  Nothing Ineffective treatments:  None tried Associated symptoms: cough, fever and wheezing   Associated symptoms: no abdominal pain and no chest pain   Patient with COPD on 2L O2 presents with cough, fevers, wheezing and hypoxia.       Home Medications Prior to Admission medications   Medication Sig Start Date End Date Taking? Authorizing Provider  acetaminophen  (TYLENOL ) 325 MG tablet Take 2 tablets (650 mg total) by mouth every 6 (six) hours. 02/14/23   Gonfa, Taye T, MD  albuterol  (PROAIR  HFA) 108 (90 Base) MCG/ACT inhaler Inhale 2 puffs into the lungs every 4 (four) hours as needed for wheezing or shortness of breath. 12/30/22   Mannam, Praveen, MD  albuterol  (PROVENTIL ) (2.5 MG/3ML) 0.083% nebulizer solution Take 2.5 mg by nebulization every 6 (six) hours as needed for wheezing or shortness of breath.    [provider]  amLODipine  (NORVASC ) 10 MG tablet Take 1 tablet (10 mg total) by mouth daily. 04/14/23   McGowen, Minetta Aly, MD  azithromycin  (ZITHROMAX ) 250 MG tablet 2 tabs po qd x 1d, then 1 tab po qd x 4d 05/28/23   McGowen, Minetta Aly, MD  fluticasone  (FLONASE ) 50 MCG/ACT nasal spray Place 2 sprays into both nostrils daily. 05/28/23   McGowen, Minetta Aly, MD  Fluticasone -Umeclidin-Vilant (TRELEGY ELLIPTA ) 100-62.5-25 MCG/ACT AEPB Inhale 1 puff into the lungs daily. 05/07/23   Antonio Baumgarten, NP  levothyroxine  (SYNTHROID ) 100 MCG tablet TAKE 1 TABLET BY MOUTH EVERY  DAY 04/14/23   McGowen, Minetta Aly, MD  lidocaine  (XYLOCAINE ) 2 % solution Apply 1/2 ml to affected area of mouth 3 times per day as needed for pain 03/05/23   McGowen, Minetta Aly, MD  Misc. Devices (PULSE OXIMETER) MISC Check oxygen  level as needed 03/12/22   McGowen, Minetta Aly, MD  pantoprazole  (PROTONIX ) 40 MG tablet Take 1 tablet (40 mg total) by mouth 2 (two) times daily. 04/22/23   McGowen, Minetta Aly, MD  predniSONE  (DELTASONE ) 10 MG tablet 4 tabs po every day x 4d, then 3 tabs po every day x 4d, then 2 tabs po every day x 4d, then 1 tab po every day x 4d 05/28/23   McGowen, Minetta Aly, MD  venlafaxine  (EFFEXOR ) 50 MG tablet Take 50 mg by mouth 2 (two) times daily. 11/28/22   [provider]  vitamin B-12 (CYANOCOBALAMIN ) 100 MCG tablet Take 100 mcg by mouth daily.    [provider]  VITAMIN D  PO Take 1 tablet by mouth daily.    [provider]      Allergies    Losartan , Penicillins, Cefixime [kdc:cefixime], Gabapentin , Indocin [indomethacin], Singulair  [montelukast  sodium], Sulfa antibiotics, Ace inhibitors, and Statins    Review of Systems   Review of Systems  Unable to perform ROS: Acuity of condition  Constitutional:  Positive for fever.  HENT:  Negative for facial swelling.   Eyes:  Negative for redness.  Respiratory:  Positive for cough, shortness  of breath and wheezing.   Cardiovascular:  Negative for chest pain.  Gastrointestinal:  Negative for abdominal pain.    Physical Exam Updated Vital Signs BP (!) 105/52   Pulse 71   Temp 99 F (37.2 C)   Resp 20   SpO2 95%  Physical Exam Vitals and nursing note reviewed.  Constitutional:      General: She is not in acute distress.    Appearance: Normal appearance. She is well-developed.  HENT:     Head: Normocephalic and atraumatic.     Nose: Nose normal.  Eyes:     Pupils: Pupils are equal, round, and reactive to light.  Cardiovascular:     Rate and Rhythm: Regular rhythm. Tachycardia present.      Pulses: Normal pulses.     Heart sounds: Normal heart sounds.  Pulmonary:     Effort: Respiratory distress present.     Breath sounds: Wheezing, rhonchi and rales present.  Abdominal:     General: Bowel sounds are normal. There is no distension.     Palpations: Abdomen is soft.     Tenderness: There is no abdominal tenderness. There is no guarding or rebound.  Musculoskeletal:        General: Normal range of motion.     Cervical back: Normal range of motion and neck supple.  Skin:    General: Skin is warm and dry.     Capillary Refill: Capillary refill takes less than 2 seconds.     Findings: No erythema or rash.  Neurological:     Mental Status: She is alert.     Deep Tendon Reflexes: Reflexes normal.  Psychiatric:        Mood and Affect: Mood normal.     ED Results / Procedures / Treatments   Labs (all labs ordered are listed, but only abnormal results are displayed) Results for orders placed or performed during the hospital encounter of 05/30/23  Resp panel by RT-PCR (RSV, Flu A&B, Covid) Anterior Nasal Swab   Collection Time: 05/30/23 12:42 AM   Specimen: Anterior Nasal Swab  Result Value Ref Range   SARS Coronavirus 2 by RT PCR NEGATIVE NEGATIVE   Influenza A by PCR NEGATIVE NEGATIVE   Influenza B by PCR NEGATIVE NEGATIVE   Resp Syncytial Virus by PCR NEGATIVE NEGATIVE  Comprehensive metabolic panel   Collection Time: 05/30/23 12:42 AM  Result Value Ref Range   Sodium 136 135 - 145 mmol/L   Potassium 3.7 3.5 - 5.1 mmol/L   Chloride 99 98 - 111 mmol/L   CO2 24 22 - 32 mmol/L   Glucose, Bld 153 (H) 70 - 99 mg/dL   BUN 24 (H) 8 - 23 mg/dL   Creatinine, Ser 7.82 0.44 - 1.00 mg/dL   Calcium  8.5 (L) 8.9 - 10.3 mg/dL   Total Protein 6.1 (L) 6.5 - 8.1 g/dL   Albumin 3.1 (L) 3.5 - 5.0 g/dL   AST 19 15 - 41 U/L   ALT 21 0 - 44 U/L   Alkaline Phosphatase 66 38 - 126 U/L   Total Bilirubin 1.0 0.0 - 1.2 mg/dL   GFR, Estimated 59 (L) >60 mL/min   Anion gap 13 5 - 15  CBC  with Differential   Collection Time: 05/30/23 12:42 AM  Result Value Ref Range   WBC 18.8 (H) 4.0 - 10.5 K/uL   RBC 3.75 (L) 3.87 - 5.11 MIL/uL   Hemoglobin 11.2 (L) 12.0 - 15.0 g/dL   HCT 95.6 (L)  36.0 - 46.0 %   MCV 91.2 80.0 - 100.0 fL   MCH 29.9 26.0 - 34.0 pg   MCHC 32.7 30.0 - 36.0 g/dL   RDW 78.2 95.6 - 21.3 %   Platelets 269 150 - 400 K/uL   nRBC 0.0 0.0 - 0.2 %   Neutrophils Relative % 91 %   Neutro Abs 16.8 (H) 1.7 - 7.7 K/uL   Lymphocytes Relative 3 %   Lymphs Abs 0.6 (L) 0.7 - 4.0 K/uL   Monocytes Relative 6 %   Monocytes Absolute 1.2 (H) 0.1 - 1.0 K/uL   Eosinophils Relative 0 %   Eosinophils Absolute 0.0 0.0 - 0.5 K/uL   Basophils Relative 0 %   Basophils Absolute 0.1 0.0 - 0.1 K/uL   Immature Granulocytes 0 %   Abs Immature Granulocytes 0.07 0.00 - 0.07 K/uL  Protime-INR   Collection Time: 05/30/23 12:42 AM  Result Value Ref Range   Prothrombin Time 14.1 11.4 - 15.2 seconds   INR 1.1 0.8 - 1.2  I-Stat Lactic Acid, ED   Collection Time: 05/30/23  1:13 AM  Result Value Ref Range   Lactic Acid, Venous 1.0 0.5 - 1.9 mmol/L  Blood gas, venous (at [facility] and [facility])   Collection Time: 05/30/23  2:29 AM  Result Value Ref Range   pH, Ven 7.32 7.25 - 7.43   pCO2, Ven 53 44 - 60 mmHg   pO2, Ven 36 32 - 45 mmHg   Bicarbonate 27.3 20.0 - 28.0 mmol/L   Acid-Base Excess 0.3 0.0 - 2.0 mmol/L   O2 Saturation 65.6 %   Patient temperature 37.0    *Note: Due to a large number of results and/or encounters for the requested time period, some results have not been displayed. A complete set of results can be found in Results Review.   DG Chest Port 1 View Result Date: 05/30/2023 CLINICAL DATA:  Questionable sepsis - evaluate for abnormality. Dyspnea EXAM: PORTABLE CHEST 1 VIEW COMPARISON:  Chest x-ray 05/21/2023, CT cardiac 06/18/2010 FINDINGS: The heart and mediastinal contours are unchanged. Atherosclerotic plaque. Interval development of bilateral lower lobes, right greater than  left interstitial and airspace opacities. Chronic coarsened interstitial markings. Bilateral trace pleural effusion. No pneumothorax. No acute osseous abnormality. IMPRESSION: 1. Interval development of bilateral lower lobes, right greater than left interstitial and airspace opacities. 2. Bilateral trace pleural effusion. 3. Aortic Atherosclerosis (ICD10-I70.0) and Emphysema (ICD10-J43.9). Electronically Signed   By: Morgane  Naveau M.D.   On: 05/30/2023 01:21   DG Chest 2 View Result Date: 05/21/2023 CLINICAL DATA:  SOB. EXAM: CHEST - 2 VIEW COMPARISON:  05/07/2022. FINDINGS: Bilateral lungs appear hyperexpanded and hyperlucent with coarse bronchovascular markings, in keeping with COPD. Bilateral lungs otherwise appear clear. No dense consolidation or lung collapse. Bilateral costophrenic angles are clear. Stable cardio-mediastinal silhouette. No acute osseous abnormalities. The soft tissues are within normal limits. IMPRESSION: No active cardiopulmonary disease. COPD. Electronically Signed   By: Beula Brunswick M.D.   On: 05/21/2023 18:08    EKG EKG Interpretation Date/Time:  Saturday Isabellarose Kope 26 2025 00:22:54 EDT Ventricular Rate:  86 PR Interval:  126 QRS Duration:  138 QT Interval:  358 QTC Calculation: 429 R Axis:   20  Text Interpretation: Sinus rhythm Atrial premature complex Right bundle branch block Confirmed by Maralee Senate, Timira Bieda ([id number]) on 05/30/2023 1:20:11 AM  Radiology DG Chest Port 1 View Result Date: 05/30/2023 CLINICAL DATA:  Questionable sepsis - evaluate for abnormality. Dyspnea EXAM: PORTABLE CHEST 1 VIEW COMPARISON:  Chest x-ray 05/21/2023, CT cardiac 06/18/2010 FINDINGS: The heart and mediastinal contours are unchanged. Atherosclerotic plaque. Interval development of bilateral lower lobes, right greater than left interstitial and airspace opacities. Chronic coarsened interstitial markings. Bilateral trace pleural effusion. No pneumothorax. No acute osseous abnormality. IMPRESSION:  1. Interval development of bilateral lower lobes, right greater than left interstitial and airspace opacities. 2. Bilateral trace pleural effusion. 3. Aortic Atherosclerosis (ICD10-I70.0) and Emphysema (ICD10-J43.9). Electronically Signed   By: Morgane  Naveau M.D.   On: 05/30/2023 01:21    Procedures .Critical Care  Performed by: Tonya Fredrickson, MD Authorized by: Tonya Fredrickson, MD   Critical care provider statement:    Critical care time (minutes):  60   Critical care end time:  05/30/2023 5:22 AM   Critical care was necessary to treat or prevent imminent or life-threatening deterioration of the following conditions:  Respiratory failure and sepsis   Critical care was time spent personally by me on the following activities:  Development of treatment plan with patient or surrogate, discussions with consultants, evaluation of patient's response to treatment, examination of patient, ordering and review of laboratory studies, ordering and review of radiographic studies, ordering and performing treatments and interventions, pulse oximetry, re-evaluation of patient's condition and review of old charts   I assumed direction of critical care for this patient from another provider in my specialty: no     Care discussed with: admitting provider   Comments:     Bipap and sepsis initiation      Medications Ordered in ED Medications  lactated ringers  infusion ( Intravenous New Bag/Given 05/30/23 0113)  ipratropium (ATROVENT ) 0.02 % nebulizer solution (  Not Given 05/30/23 0104)  albuterol  (PROVENTIL ) (2.5 MG/3ML) 0.083% nebulizer solution (  Not Given 05/30/23 0102)  albuterol  (PROVENTIL ) (2.5 MG/3ML) 0.083% nebulizer solution 5 mg (0 mg Nebulization Hold 05/30/23 0103)  aztreonam (AZACTAM) 2 g in sodium chloride  0.9 % 100 mL IVPB (0 g Intravenous Stopped 05/30/23 0323)  metroNIDAZOLE  (FLAGYL ) IVPB 500 mg (0 mg Intravenous Stopped 05/30/23 0323)  vancomycin  (VANCOCIN ) IVPB 1000 mg/200 mL premix (0 mg  Intravenous Stopped 05/30/23 0323)  ipratropium-albuterol  (DUONEB) 0.5-2.5 (3) MG/3ML nebulizer solution 3 mL (3 mLs Nebulization Given 05/30/23 0051)  albuterol  (PROVENTIL ) (2.5 MG/3ML) 0.083% nebulizer solution 2.5 mg (2.5 mg Nebulization Given 05/30/23 0051)  acetaminophen  (TYLENOL ) tablet 1,000 mg (1,000 mg Oral Given 05/30/23 0219)  lactated ringers  bolus 500 mL (500 mLs Intravenous Bolus 05/30/23 0322)    ED Course/ Medical Decision Making/ A&P                                 Medical Decision Making Patient with worsening SOB, cough and fevers and hypoxia on 2 L home O2  Amount and/or Complexity of Data Reviewed Independent Historian:     Details: Family see above  External Data Reviewed: notes.    Details: Previous notes reviewed  Labs: ordered.    Details: Elevated white count 18.8, low hemoglobin 11.2,  normal platelets 269.  Normal covid and flu. Normal sodium 136, normal potassium 3.7, normal 0.94  Radiology: ordered and independent interpretation performed. Decision-making details documented in ED Course.    Details: PNA B ECG/medicine tests: ordered and independent interpretation performed. Decision-making details documented in ED Course.  Risk OTC drugs. Prescription drug management. Decision regarding hospitalization.    Final Clinical Impression(s) / ED Diagnoses Final diagnoses:  COPD exacerbation (HCC)  Sepsis, due to unspecified organism,  unspecified whether acute organ dysfunction present [address] [facility])   The patient appears reasonably stabilized for admission considering the current resources, flow, and capabilities available in the ED at this time, and I doubt any other [facility] requiring further screening and/or treatment in the ED prior to admission.  Rx / DC Orders ED Discharge Orders     None         Meckenzie Balsley, MD 05/30/23 [id number]

## 2023-05-30 NOTE — ED Provider Notes (Signed)
 Dr Lowell Rude hospitalist confirms plan to admit patient this morning and aware of patient being admitted   Arvilla Birmingham, MD 05/30/23 0730

## 2023-05-30 NOTE — Progress Notes (Signed)
   05/30/23 1528  TOC Brief Assessment  Insurance and Status Reviewed  Patient has primary care physician Yes  Home environment has been reviewed From home  Prior level of function: Independent  Prior/Current Home Services No current home services  Social Drivers of Health Review SDOH reviewed no interventions necessary  Readmission risk has been reviewed Yes  Transition of care needs no transition of care needs at this time

## 2023-05-30 NOTE — Sepsis Progress Note (Signed)
 Elink monitoring for the code sepsis protocol.

## 2023-05-30 NOTE — Progress Notes (Signed)
   05/30/23 0051  Aerosol Therapy Tx  $ Hand Held Nebulizer  2  Medications Albuterol ;Duoneb  Solution Hypertonic saline  Delivery Source Oxygen   Delivery Device Aerogen  Pre-Treatment Pulse 86  Pre-Treatment Respirations 32  Treatment Tolerance Tolerated well  Treatment Given 2  RT Breath Sounds  Bilateral Breath Sounds Coarse crackles  R Upper  Breath Sounds Coarse crackles  L Upper Breath Sounds Coarse crackles  R Lower Breath Sounds Coarse crackles  L Lower Breath Sounds Coarse crackles  Oxygen  Therapy/Pulse Ox  O2 Device Bi-PAP  FiO2 (%) 50 %  SpO2 96 %  Safety Instructions Yes (Comment)

## 2023-05-30 NOTE — Progress Notes (Signed)
 Patient on continuous O2 monitor at bedside, fluctuating between 87-91% on 7L HFNC. DOE and can get anxious , she is worried about her s/o who is currently in the ED as well.

## 2023-05-30 NOTE — Sepsis Progress Note (Signed)
Notified bedside nurse of need to draw and administer antibiotics and blood cultures.  

## 2023-05-30 NOTE — H&P (Signed)
 History and Physical  Priscilla Cantrell:[id number] DOB: 1936/01/17 DOA: 05/30/2023  PCP: Shelvia Dick, MD   Chief Complaint: Cough  HPI: Priscilla Cantrell is a 88 y.o. female with medical history significant for CKD stage II, depression, chronic hypoxic respiratory failure due to COPD on 2 L at baseline being admitted to the hospital with sepsis due to community-acquired pneumonia.  History is provided by the patient, as well as her niece who is at the bedside.  She has been coughing up green sputum for the last couple of weeks, feeling more short of breath with exertion.  This morning at home she was noted to be saturating about 70% on 2 L nasal cannula workup in the ER as noted below showed evidence of community-acquired pneumonia, she was started on empiric IV antibiotics.  She was placed on BiPAP overnight for work of breathing.  This morning she feels much better, and wants to know if she can have her BiPAP mask removed.  She denies any chest pain, or fevers at home.  No nausea vomiting or diarrhea.  Review of Systems: Please see HPI for pertinent positives and negatives. A complete 10 system review of systems are otherwise negative.  Past Medical History:  Diagnosis Date   Allergic rhinitis    with upper airway cough: as of 07/2017 pulm f/u she was instructed to use astelin , flonase , and saline more consistently   BPPV (benign paroxysmal positional vertigo) 03/2018   C. difficile colitis    Chest pain, non-cardiac    Cardiac CT showed no signif obstructive dz, mildly elevated calcium  level (Dr. Audery Blazing).   Chronic cystitis with hematuria    Dr. Lyndol Santee   Chronic hypoxemic respiratory failure (HCC) 02/02/2015   2L oxygen  24/7   Chronic renal insufficiency, stage 2 (mild)    GFR approx 60   Cold hands    NCS/EMGs normal.  Hand dysesthesias per neuro--reassured   COPD (chronic obstructive pulmonary disease) (HCC)    GOLD 4.  spirometry 01/10/09 FEV 0.97(52%), FEV1% 47.   With chronic bronchitis as of 07/2017.   DDD (degenerative disc disease), cervical    1998 MRI C5-6 impingemt (left).  MRI 2021 (neurol) 1 level canal stenosis, 1 level foraminal stenosis--->PT   DDD (degenerative disc disease), lumbar    Depression with anxiety 04/15/2011   Diarrheal disease Summer 2017   ? 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 Dr Andriette Keeling).  As of 10/05/15 pt reports GI dx'd her with C diff and rx'd more flagyl  and she is also on cholestyramine .  As of 02/2016, pt's sx's resolved completely.   Diverticulosis of colon    Procto '97 and colonoscopy 2002   Dysplastic nevus of upper extremity 04/2014   R tricep (Dr. Steen Eden)   Edema of both lower extremities due to peripheral venous insufficiency    Varicosities bilat, swelling L>R.  R baker's cyst.  Got vein clinic eval 02/2018->sclerotherapy recommended but since no hemorrhage or ulceration, insurance will not cover the procedure.   Family history of adverse reaction to anesthesia    mother died when patient age 9 receiving anesthesia for thyroid  surgery   Fibrocystic breast disease    w/fibroadenoma.  Bx's showed NO atypia (Dr. Linell Rhymes).  Mammo neg 2008.   GERD (gastroesophageal reflux disease)    History of double vision    Ophthalmologist, Dr. Lasandra Points, is further evaluating this with MRI  orbits and limited brain (myesthenia  gravis testing neg 07/2010)   History of home oxygen  therapy    uses 2 liters at hs   Hypertension    EKG 03/2010 normal   Hypothyroidism    Hashimoto's, dx'd 1989   Idiopathic peripheral neuropathy 04/2018   Sensory (numb feet) S1 distribution bilat, c/w spinal stenosis sensory neuropathy (no pain). 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  Labs [place], MRI C spine->some spondylosis/stenosis.  UE NCS/EMS: normal 04/2019.   Lesion of right native kidney  2017   found on CT 02/2015.  F/u MRI 03/31/15 showed it to be smaller and less likely of any significance but repeat CT renal protocol in 77mo was recommended by radiology.  This was done 10/26/15 and showed no interval change in the lesion, but b/c the lesion enhances with contrast, radiol rec'd urol consult (b/c pap renal RCC could not be excluded).  Saw urol 03/06/16--stable on u/s 09/2016 and 09/2017.   OAB (overactive bladder)    Osteoarthritis, hip, bilateral    she is s/p bilat THA as of 02/2018.   Osteoporosis    radius, T score -2.5->rec'd alendronate  08/2019->plan rpt DEXA 08/2021.   Pneumonia    Postmenopausal atrophic vaginitis    improved with estrace 2 X/week.   Prolapse of vaginal cuff after hysterectomy    10/2018: Dr. Zackary Heron. Mild colpocele 04/2019 per GYN (no surgery)   Pulmonary nodule    CT chest 12/10, June 2011.  No nodule on CT 06/2010.   Recurrent Clostridium difficile diarrhea    Episode 09/19/2016: resolved with prolonged course of flagyl .  11/2016 recurrence treated with 10d of Dificid (Fidaxomicin) by GI (Dr. Andriette Keeling).   Recurrent UTI    likely related to pt's atrophic vaginitis.  Urol started vaginal estrace 03/2016.   Rib fracture 09/2018   R 7th, laterally-->sustained when her dog pulled her over and she fell.   RLS (restless legs syndrome)    neuro->gabapentin  trial 2020/21   Shingles 02/2014   R abd/flank   Spinal stenosis of lumbar region with neurogenic claudication    2008 MRI (L3-4, L4-5, L5-S1).  +S1 distribution sensory loss bilat   Past Surgical History:  Procedure Laterality Date   ABDOMINAL HYSTERECTOMY  1978   no BSO per pt--nonmalignant reasons   APPENDECTOMY     BREAST CYST EXCISION     Last screening mammogram 10/2010 was normal.   CHOLECYSTECTOMY  2001   COLONOSCOPY  04/2005; 12/06/2015   2007 NORMAL.  2017 adenomatous polyp x 1.  Mod sigmoid diverticulosis (Dr. Andriette Keeling).     DEXA  05/18/2017; 08/7019   2019 NORMAL (T-score 0.1).  08/2019->T score  -2.5->alendronate  started.  03/2022 T score -2.7   HIP CLOSED REDUCTION Right 12/25/2015   Procedure: CLOSED REDUCTION HIP;  Surgeon: Janeth Medicus, MD;  Location: [facility];  Service: Orthopedics;  Laterality: Right;   NCV/EMS  10/2018   chronic and symmetric sensorimotor axonal polyneuropathy affecting the lower extremities   TONSILLECTOMY     TOTAL HIP ARTHROPLASTY Right 11/01/2014   Procedure: RIGHT TOTAL HIP ARTHROPLASTY;  Surgeon: Hazle Lites, MD;  Location: [facility];  Service: Orthopedics;  Laterality: Right;   TOTAL HIP ARTHROPLASTY Left 12/08/2017   Procedure: LEFT TOTAL HIP ARTHROPLASTY ANTERIOR APPROACH;  Surgeon: Claiborne Crew, MD;  Location: [facility];  Service: Orthopedics;  Laterality: Left;  70 mins   TOTAL HIP REVISION Right 02/10/2023   Procedure: RIGHT TOTAL HIP REVISION;  Surgeon: Claiborne Crew, MD;  Location: [facility];  Service: Orthopedics;  Laterality: Right;   TRANSTHORACIC ECHOCARDIOGRAM  08/2014   2016 EF 60-65%, grade I diast dysfxn. 06/21/21 unchanged.   TUBAL LIGATION  1974   Social History:  reports that she quit smoking about 33 years ago. Her smoking use included cigarettes. She started smoking about 67 years ago. She has a 51 pack-year smoking history. She has never used smokeless tobacco. She reports current alcohol  use. She reports that she does not use drugs.  Allergies  Allergen Reactions   Losartan  Other (See Comments)    hyperkalemia   Penicillins Hives, Swelling and Rash    Has patient had a PCN reaction causing immediate rash, facial/tongue/throat swelling, SOB or lightheadedness with hypotension: YES Has patient had a PCN reaction causing severe rash involving mucus membranes or skin necrosis: NO Has patient had a PCN reaction that required hospitalization NO Has patient had a PCN reaction occurring within the last 10 years: NO If all of the above answers are "NO", then may proceed with Cephalosporin use.    Cefixime [Kdc:Cefixime] Other (See  Comments)    unspecified   Gabapentin  Other (See Comments)    Feels woozy   Indocin [Indomethacin] Other (See Comments)    Painful tongue and throat   Singulair  [Montelukast  Sodium]     Chest tightness, decreased mental clarity   Sulfa Antibiotics Other (See Comments)    unspecified   Ace Inhibitors Other (See Comments)    cough   Statins Other (See Comments)    Myalgias     Family History  Problem Relation Age of Onset   Goiter Mother    Psoriasis Father        od liver   Cirrhosis Father    Diabetes Daughter        Type 2   Stroke Maternal Grandfather    Stroke Paternal Grandmother    Hypertension Paternal Grandmother    Hernia Paternal Grandmother    Cancer Paternal Grandfather        lung   Cancer Maternal Aunt        cancer of the stomach     Prior to Admission medications   Medication Sig Start Date End Date Taking? Authorizing Provider  acetaminophen  (TYLENOL ) 325 MG tablet Take 2 tablets (650 mg total) by mouth every 6 (six) hours. Patient taking differently: Take 650 mg by mouth every 6 (six) hours as needed for moderate pain (pain score 4-6) or fever. 02/14/23  Yes Gonfa, Taye T, MD  albuterol  (PROAIR  HFA) 108 (90 Base) MCG/ACT inhaler Inhale 2 puffs into the lungs every 4 (four) hours as needed for wheezing or shortness of breath. 12/30/22  Yes Mannam, Praveen, MD  albuterol  (PROVENTIL ) (2.5 MG/3ML) 0.083% nebulizer solution Take 2.5 mg by nebulization every 6 (six) hours as needed for wheezing or shortness of breath.   Yes [provider]  amLODipine  (NORVASC ) 10 MG tablet Take 1 tablet (10 mg total) by mouth daily. 04/14/23  Yes McGowen, Minetta Aly, MD  azithromycin  (ZITHROMAX ) 250 MG tablet 2 tabs po qd x 1d, then 1 tab po qd x 4d 05/28/23  Yes McGowen, Minetta Aly, MD  fluticasone  (FLONASE ) 50 MCG/ACT nasal spray Place 2 sprays into both nostrils daily. 05/28/23  Yes McGowen, Minetta Aly, MD  Fluticasone -Umeclidin-Vilant (TRELEGY ELLIPTA ) 100-62.5-25 MCG/ACT  AEPB Inhale 1 puff into the lungs daily. 05/07/23  Yes Antonio Baumgarten, NP  levothyroxine  (SYNTHROID ) 100 MCG tablet TAKE 1 TABLET BY MOUTH EVERY DAY 04/14/23  Yes McGowen, Minetta Aly,  MD  lidocaine  (XYLOCAINE ) 2 % solution Apply 1/2 ml to affected area of mouth 3 times per day as needed for pain 03/05/23  Yes McGowen, Minetta Aly, MD  pantoprazole  (PROTONIX ) 40 MG tablet Take 1 tablet (40 mg total) by mouth 2 (two) times daily. 04/22/23  Yes McGowen, Minetta Aly, MD  predniSONE  (DELTASONE ) 10 MG tablet 4 tabs po every day x 4d, then 3 tabs po every day x 4d, then 2 tabs po every day x 4d, then 1 tab po every day x 4d 05/28/23  Yes McGowen, Minetta Aly, MD  venlafaxine  (EFFEXOR ) 50 MG tablet Take 50 mg by mouth 2 (two) times daily. 11/28/22  Yes [provider]  Misc. Devices (PULSE OXIMETER) MISC Check oxygen  level as needed 03/12/22   McGowen, Minetta Aly, MD    Physical Exam: BP (!) 105/52   Pulse 67   Temp 99 F (37.2 C)   Resp 19   SpO2 95%  General:  Alert, oriented, calm, in no acute distress, elderly female appearing her stated age.  Her niece is at the bedside. Cardiovascular: RRR, no murmurs or rubs, no peripheral edema  Respiratory: Distant bilateral breath sounds, with bilateral basilar crackles, without wheezing or rhonchi Abdomen: soft, nontender, nondistended, normal bowel tones heard  Skin: dry, no rashes  Musculoskeletal: no joint effusions, normal range of motion  Psychiatric: appropriate affect, normal speech  Neurologic: extraocular muscles intact, clear speech, moving all extremities with intact sensorium         Labs on Admission:  Basic Metabolic Panel: Recent Labs  Lab 05/30/23 0042  NA 136  K 3.7  CL 99  CO2 24  GLUCOSE 153*  BUN 24*  CREATININE 0.94  CALCIUM  8.5*   Liver Function Tests: Recent Labs  Lab 05/30/23 0042  AST 19  ALT 21  ALKPHOS 66  BILITOT 1.0  PROT 6.1*  ALBUMIN 3.1*   No results for input(s): "LIPASE", "AMYLASE" in the last 168  hours. No results for input(s): "AMMONIA" in the last 168 hours. CBC: Recent Labs  Lab 05/30/23 0042  WBC 18.8*  NEUTROABS 16.8*  HGB 11.2*  HCT 34.2*  MCV 91.2  PLT 269   Cardiac Enzymes: No results for input(s): "CKTOTAL", "CKMB", "CKMBINDEX", "TROPONINI" in the last 168 hours. BNP (last 3 results) No results for input(s): "BNP" in the last 8760 hours.  ProBNP (last 3 results) No results for input(s): "PROBNP" in the last 8760 hours.  CBG: No results for input(s): "GLUCAP" in the last 168 hours.  Radiological Exams on Admission: DG Chest Port 1 View Result Date: 05/30/2023 CLINICAL DATA:  Questionable sepsis - evaluate for abnormality. Dyspnea EXAM: PORTABLE CHEST 1 VIEW COMPARISON:  Chest x-ray 05/21/2023, CT cardiac 06/18/2010 FINDINGS: The heart and mediastinal contours are unchanged. Atherosclerotic plaque. Interval development of bilateral lower lobes, right greater than left interstitial and airspace opacities. Chronic coarsened interstitial markings. Bilateral trace pleural effusion. No pneumothorax. No acute osseous abnormality. IMPRESSION: 1. Interval development of bilateral lower lobes, right greater than left interstitial and airspace opacities. 2. Bilateral trace pleural effusion. 3. Aortic Atherosclerosis (ICD10-I70.0) and Emphysema (ICD10-J43.9). Electronically Signed   By: Morgane  Naveau M.D.   On: 05/30/2023 01:21   Assessment/Plan Priscilla Cantrell is a 88 y.o. female with medical history significant for CKD stage II, depression, chronic hypoxic respiratory failure due to COPD on 2 L at baseline being admitted to the hospital with sepsis due to community-acquired pneumonia.   Sepsis-meeting criteria with fever, tachycardia, tachypnea,  leukocytosis.  Source is community-acquired pneumonia.  No evidence of endorgan dysfunction. -Inpatient admission -Continue gentle hydration, given normal lactate but mild hypotension -Empiric IV doxycycline   Acute on chronic  hypoxic respiratory failure-due to community-acquired pneumonia superimposed on COPD -Continue Breztri in the hospital, as Trelegy equivalent -Albuterol  neb as needed  Hypertension-patient's blood pressure is currently borderline low, will hold home amlodipine   Community-acquired pneumonia-treating with empiric IV doxycycline   CKD 2-renal function appears to be at baseline  Hypothyroidism-Synthroid   GERD-Protonix   DVT prophylaxis: Lovenox      Code Status: Full Code  Consults called: None  Admission status: The appropriate patient status for this patient is INPATIENT. Inpatient status is judged to be reasonable and necessary in order to provide the required intensity of service to ensure the patient's safety. The patient's presenting symptoms, physical exam findings, and initial radiographic and laboratory data in the context of their chronic comorbidities is felt to place them at high risk for further clinical deterioration. Furthermore, it is not anticipated that the patient will be medically stable for discharge from the hospital within 2 midnights of admission.    I certify that at the point of admission it is my clinical judgment that the patient will require inpatient hospital care spanning beyond 2 midnights from the point of admission due to high intensity of service, high risk for further deterioration and high frequency of surveillance required  Time spent: 59 minutes  Noor Witte Rickey Charm MD [facility] Pager [phone]  If 7PM-7AM, please contact night-coverage [email or web address] Password [facility]  05/30/2023, 9:04 AM

## 2023-05-30 NOTE — ED Triage Notes (Signed)
 Patient here from home reporting SOB, 70 % on normal 2L home wear. Coughing up green mucus x2 weeks. Patient currently taking azithromycin  with no relief.

## 2023-05-30 NOTE — Plan of Care (Signed)

## 2023-05-31 DIAGNOSIS — A419 Sepsis, unspecified organism: Secondary | ICD-10-CM

## 2023-05-31 DIAGNOSIS — J189 Pneumonia, unspecified organism: Secondary | ICD-10-CM | POA: Diagnosis not present

## 2023-05-31 DIAGNOSIS — J441 Chronic obstructive pulmonary disease with (acute) exacerbation: Secondary | ICD-10-CM | POA: Diagnosis not present

## 2023-05-31 DIAGNOSIS — I5033 Acute on chronic diastolic (congestive) heart failure: Secondary | ICD-10-CM | POA: Diagnosis not present

## 2023-05-31 LAB — COMPREHENSIVE METABOLIC PANEL WITH GFR
ALT: 24 U/L (ref 0–44)
AST: 16 U/L (ref 15–41)
Albumin: 2.3 g/dL — ABNORMAL LOW (ref 3.5–5.0)
Alkaline Phosphatase: 56 U/L (ref 38–126)
Anion gap: 9 (ref 5–15)
BUN: 26 mg/dL — ABNORMAL HIGH (ref 8–23)
CO2: 24 mmol/L (ref 22–32)
Calcium: 8.4 mg/dL — ABNORMAL LOW (ref 8.9–10.3)
Chloride: 106 mmol/L (ref 98–111)
Creatinine, Ser: 0.59 mg/dL (ref 0.44–1.00)
GFR, Estimated: >60 mL/min (ref >60)
Glucose, Bld: 125 mg/dL — ABNORMAL HIGH (ref 70–99)
Potassium: 4.2 mmol/L (ref 3.5–5.1)
Sodium: 139 mmol/L (ref 135–145)
Total Bilirubin: 0.8 mg/dL (ref 0.0–1.2)
Total Protein: 5.1 g/dL — ABNORMAL LOW (ref 6.5–8.1)

## 2023-05-31 LAB — BASIC METABOLIC PANEL WITH GFR
Anion gap: 9 (ref 5–15)
BUN: 28 mg/dL — ABNORMAL HIGH (ref 8–23)
CO2: 24 mmol/L (ref 22–32)
Calcium: 8.5 mg/dL — ABNORMAL LOW (ref 8.9–10.3)
Chloride: 103 mmol/L (ref 98–111)
Creatinine, Ser: 0.7 mg/dL (ref 0.44–1.00)
GFR, Estimated: >60 mL/min (ref >60)
Glucose, Bld: 112 mg/dL — ABNORMAL HIGH (ref 70–99)
Potassium: 3.8 mmol/L (ref 3.5–5.1)
Sodium: 136 mmol/L (ref 135–145)

## 2023-05-31 LAB — BLOOD GAS, ARTERIAL
Acid-Base Excess: 2.2 mmol/L — ABNORMAL HIGH (ref 0.0–2.0)
Bicarbonate: 27.8 mmol/L (ref 20.0–28.0)
Drawn by: 72603
Expiratory PAP: 6 cmH2O
FIO2: 70 %
Inspiratory PAP: 12 cmH2O
O2 Saturation: 91.2 %
Patient temperature: 36.5
RATE: 15 {breaths}/min
pCO2 arterial: 45 mmHg (ref 32–48)
pH, Arterial: 7.4 (ref 7.35–7.45)
pO2, Arterial: 53 mmHg — ABNORMAL LOW (ref 83–108)

## 2023-05-31 LAB — CBC
HCT: 34.3 % — ABNORMAL LOW (ref 36.0–46.0)
Hemoglobin: 10.8 g/dL — ABNORMAL LOW (ref 12.0–15.0)
MCH: 29 pg (ref 26.0–34.0)
MCHC: 31.5 g/dL (ref 30.0–36.0)
MCV: 92 fL (ref 80.0–100.0)
Platelets: 249 10*3/uL (ref 150–400)
RBC: 3.73 MIL/uL — ABNORMAL LOW (ref 3.87–5.11)
RDW: 13.1 % (ref 11.5–15.5)
WBC: 19.2 10*3/uL — ABNORMAL HIGH (ref 4.0–10.5)
nRBC: 0 % (ref 0.0–0.2)

## 2023-05-31 LAB — URINE CULTURE: Culture: <10000 — AB

## 2023-05-31 LAB — TSH: TSH: 0.313 u[IU]/mL — ABNORMAL LOW (ref 0.350–4.500)

## 2023-05-31 LAB — STREP PNEUMONIAE URINARY ANTIGEN: Strep Pneumo Urinary Antigen: NEGATIVE

## 2023-05-31 LAB — BRAIN NATRIURETIC PEPTIDE: B Natriuretic Peptide: 834.8 pg/mL — ABNORMAL HIGH (ref 0.0–100.0)

## 2023-05-31 LAB — PROCALCITONIN: Procalcitonin: 2.7 ng/mL

## 2023-05-31 MED ORDER — METOPROLOL TARTRATE 25 MG PO TABS
12.5000 mg | ORAL_TABLET | Freq: Two times a day (BID) | ORAL | Status: DC
Start: 2023-05-31 — End: 2023-05-31

## 2023-05-31 MED ORDER — METOPROLOL TARTRATE 5 MG/5ML IV SOLN
2.5000 mg | Freq: Once | INTRAVENOUS | Status: AC
  Administered 2023-05-31: 2.5 mg via INTRAVENOUS
  Filled 2023-05-31: qty 5

## 2023-05-31 MED ORDER — LEVALBUTEROL HCL 0.63 MG/3ML IN NEBU: 0.6300 mg | INHALATION_SOLUTION | Freq: Four times a day (QID) | RESPIRATORY_TRACT | Status: DC | PRN

## 2023-05-31 MED ORDER — ENOXAPARIN SODIUM 40 MG/0.4ML IJ SOSY
40.0000 mg | PREFILLED_SYRINGE | INTRAMUSCULAR | Status: DC
  Administered 2023-05-31 – 2023-06-01 (×2): 40 mg via SUBCUTANEOUS
  Filled 2023-05-31 (×2): qty 0.4

## 2023-05-31 MED ORDER — LEVOFLOXACIN IN D5W 500 MG/100ML IV SOLN
500.0000 mg | INTRAVENOUS | Status: DC
  Administered 2023-05-31 – 2023-06-04 (×5): 500 mg via INTRAVENOUS
  Filled 2023-05-31 (×5): qty 100

## 2023-05-31 MED ORDER — METOPROLOL TARTRATE 25 MG PO TABS
12.5000 mg | ORAL_TABLET | Freq: Two times a day (BID) | ORAL | Status: DC
  Administered 2023-05-31 – 2023-06-01 (×2): 12.5 mg via ORAL
  Filled 2023-05-31 (×2): qty 1

## 2023-05-31 MED ORDER — IPRATROPIUM BROMIDE 0.02 % IN SOLN
0.5000 mg | Freq: Four times a day (QID) | RESPIRATORY_TRACT | Status: DC
  Administered 2023-05-31 – 2023-06-03 (×12): 0.5 mg via RESPIRATORY_TRACT
  Filled 2023-05-31 (×12): qty 2.5

## 2023-05-31 MED ORDER — POLYETHYLENE GLYCOL 3350 17 G PO PACK
17.0000 g | PACK | Freq: Every day | ORAL | Status: DC
  Administered 2023-05-31 – 2023-06-16 (×7): 17 g via ORAL
  Filled 2023-05-31 (×13): qty 1

## 2023-05-31 MED ORDER — METOPROLOL TARTRATE 5 MG/5ML IV SOLN
1.2500 mg | Freq: Once | INTRAVENOUS | Status: AC | PRN
  Administered 2023-05-31: 1.3 mg via INTRAVENOUS
  Filled 2023-05-31: qty 5

## 2023-05-31 MED ORDER — METOPROLOL TARTRATE 25 MG PO TABS: 12.5000 mg | ORAL_TABLET | Freq: Two times a day (BID) | ORAL | Status: DC

## 2023-05-31 MED ORDER — METHYLPREDNISOLONE SODIUM SUCC 40 MG IJ SOLR
40.0000 mg | Freq: Two times a day (BID) | INTRAMUSCULAR | Status: DC
  Administered 2023-05-31 – 2023-06-02 (×6): 40 mg via INTRAVENOUS
  Filled 2023-05-31 (×6): qty 1

## 2023-05-31 MED ORDER — ACETYLCYSTEINE 20 % IN SOLN
3.0000 mL | Freq: Two times a day (BID) | RESPIRATORY_TRACT | Status: AC
  Administered 2023-05-31 – 2023-06-01 (×2): 3 mL via RESPIRATORY_TRACT
  Filled 2023-05-31 (×4): qty 4

## 2023-05-31 MED ORDER — LEVALBUTEROL HCL 0.63 MG/3ML IN NEBU
0.6300 mg | INHALATION_SOLUTION | Freq: Four times a day (QID) | RESPIRATORY_TRACT | Status: DC
  Administered 2023-05-31 – 2023-06-03 (×12): 0.63 mg via RESPIRATORY_TRACT
  Filled 2023-05-31 (×12): qty 3

## 2023-05-31 MED ORDER — FUROSEMIDE 10 MG/ML IJ SOLN
40.0000 mg | Freq: Once | INTRAMUSCULAR | Status: AC
  Administered 2023-05-31: 40 mg via INTRAVENOUS
  Filled 2023-05-31: qty 4

## 2023-05-31 MED ORDER — SENNOSIDES-DOCUSATE SODIUM 8.6-50 MG PO TABS
2.0000 | ORAL_TABLET | Freq: Two times a day (BID) | ORAL | Status: DC
  Administered 2023-05-31 – 2023-06-16 (×17): 2 via ORAL
  Filled 2023-05-31 (×27): qty 2

## 2023-05-31 MED ORDER — GUAIFENESIN ER 600 MG PO TB12
1200.0000 mg | ORAL_TABLET | Freq: Two times a day (BID) | ORAL | Status: DC
  Administered 2023-06-01 – 2023-06-17 (×33): 1200 mg via ORAL
  Filled 2023-05-31 (×33): qty 2

## 2023-05-31 MED ORDER — DIPHENHYDRAMINE HCL 50 MG/ML IJ SOLN
12.5000 mg | Freq: Once | INTRAMUSCULAR | Status: AC
  Administered 2023-05-31: 12.5 mg via INTRAVENOUS
  Filled 2023-05-31: qty 1

## 2023-05-31 MED ORDER — LEVALBUTEROL HCL 0.63 MG/3ML IN NEBU
0.6300 mg | INHALATION_SOLUTION | Freq: Once | RESPIRATORY_TRACT | Status: AC
  Administered 2023-05-31: 0.63 mg via RESPIRATORY_TRACT
  Filled 2023-05-31: qty 3

## 2023-05-31 NOTE — Progress Notes (Addendum)
 [facility]  PROGRESS NOTE  Priscilla Cantrell ZOX:[id number] DOB: Jul 15, 1935 DOA: 05/30/2023 PCP: Shelvia Dick, MD   Brief HPI:    88 y.o. female with medical history significant for CKD stage II, depression, chronic hypoxic respiratory failure due to COPD on 2 L at baseline being admitted to the hospital with sepsis due to community-acquired pneumonia.  History is provided by the patient, as well as her niece who is at the bedside.  She has been coughing up green sputum for the last couple of weeks, feeling more short of breath with exertion.  This morning at home she was noted to be saturating about 70% on 2 L nasal cannula workup in the ER as noted below showed evidence of community-acquired pneumonia, she was started on empiric IV antibiotics    Assessment/Plan:   Sepsis - Presented with fever, tachycardia, tachypnea, leukocytosis - Source of infection likely community-acquired pneumonia -Procalcitonin 2.70 - Started on IV doxycycline ;  - Will discontinue doxycycline  and start IV Levaquin  500 mg daily   Acute on chronic hypoxic respiratory failure-due to community-acquired pneumonia superimposed on COPD -Start Solu-Medrol  40 mg IV twice daily -Xopenex  nebulizer for 6 hours, will add ipratropium nebulizer every 6 hours -Will start Mucomyst nebulizer treatment - Continue BiPAP  Acute on chronic diastolic CHF -Echocardiogram from 5/23 showed EF of 65% with grade 1 diastolic dysfunction -Her BNP obtained is elevated at 838 -Will give Lasix  40 mg IV and assess response  Hypertension -Amlodipine  on hold due to low blood pressure    CKD 2 -renal function appears to be at baseline -Started on Lasix  as above - Follow BMP in am   Hypothyroidism -Synthroid    GERD -Protonix     Medications     budeson-glycopyrrolate -formoterol   2 puff Inhalation BID   enoxaparin  (LOVENOX ) injection  40 mg Subcutaneous Q24H   fluticasone   2 spray Each Nare Daily   levothyroxine   100  mcg Oral Q0600   metoprolol tartrate  12.5 mg Oral BID   pantoprazole   40 mg Oral BID   polyethylene glycol  17 g Oral Daily   senna-docusate  2 tablet Oral BID   venlafaxine   50 mg Oral BID     Data Reviewed:   CBG:  No results for input(Cantrell): "GLUCAP" in the last 168 hours.  SpO2: 91 % O2 Flow Rate (L/min): 7 L/min FiO2 (%): 70 %    Vitals:   05/31/23 0311 05/31/23 0354 05/31/23 0355 05/31/23 0512  BP:  119/83  114/65  Pulse:    80  Resp:   (!) 34 (!) 21  Temp:    99 F (37.2 C)  TempSrc:    Oral  SpO2: 91%   91%  Weight:      Height:          Data Reviewed:  Basic Metabolic Panel: Recent Labs  Lab 05/30/23 0042 05/31/23 0411  NA 136 136  K 3.7 3.8  CL 99 103  CO2 24 24  GLUCOSE 153* 112*  BUN 24* 28*  CREATININE 0.94 0.70  CALCIUM  8.5* 8.5*    CBC: Recent Labs  Lab 05/30/23 0042 05/31/23 0411  WBC 18.8* 19.2*  NEUTROABS 16.8*  --   HGB 11.2* 10.8*  HCT 34.2* 34.3*  MCV 91.2 92.0  PLT 269 249    LFT Recent Labs  Lab 05/30/23 0042  AST 19  ALT 21  ALKPHOS 66  BILITOT 1.0  PROT 6.1*  ALBUMIN 3.1*     Antibiotics: Anti-infectives (From  admission, onward)    Start     Dose/Rate Route Frequency Ordered Stop   05/30/23 0900  doxycycline  (VIBRAMYCIN ) 100 mg in sodium chloride  0.9 % 250 mL IVPB        100 mg 125 mL/hr over 120 Minutes Intravenous Every 12 hours 05/30/23 0848     05/30/23 0045  aztreonam (AZACTAM) 2 g in sodium chloride  0.9 % 100 mL IVPB        2 g 200 mL/hr over 30 Minutes Intravenous  Once 05/30/23 0037 05/30/23 0323   05/30/23 0045  metroNIDAZOLE  (FLAGYL ) IVPB 500 mg        500 mg 100 mL/hr over 60 Minutes Intravenous  Once 05/30/23 0037 05/30/23 0323   05/30/23 0045  vancomycin  (VANCOCIN ) IVPB 1000 mg/200 mL premix        1,000 mg 200 mL/hr over 60 Minutes Intravenous  Once 05/30/23 0037 05/30/23 0323        DVT prophylaxis: Lovenox   Code Status: Full code  Family Communication:    CONSULTS     Subjective   Patient'Cantrell respiratory status worsened overnight.  Currently on BiPAP, 75% FiO2   Objective    Physical Examination:   General-appears in no acute distress Heart-S1-S2, regular, no murmur auscultated Lungs-bilateral rhonchi auscultated Abdomen-soft, nontender, no organomegaly Extremities-no edema in the lower extremities Neuro-alert, oriented x3, no focal deficit noted   Status is: Inpatient:             Priscilla Cantrell Priscilla Cantrell   [facility] If 7PM-7AM, please contact night-coverage at [email or web address], Office  [phone]   05/31/2023, 7:49 AM  LOS: 1 day

## 2023-05-31 NOTE — Progress Notes (Signed)
 Patient is alert and oriented, answering appropiately and following commands. Patient was ordered a tele box for closer, more accurate monitoring. She is on the bipap. She was also ordered an EKG, ABG, and chest x-ray as well. She was noted to be in afib RVR via EKG and provider was notified. See new orders. Charge nurse and rapid response were also notified. No rapid response was called, she was just made aware of the patient status. Patient was given IV metoprolol and tolerated well. Her heart rate responded by dropping to the low 100s and eventually her rhythm changed back to normal sinus rhythm. No distress noted. Will continue to monitor.

## 2023-05-31 NOTE — Progress Notes (Signed)
 Pt converted into A-fib (new onset), HR 90'S to 130's.  Lopressor 2.5 mg given IV per [facility], NP orders.  HR decreased to 90's still a-fib.  See chart for additional orders received and initiated.

## 2023-05-31 NOTE — Progress Notes (Signed)
   05/31/23 2249  BiPAP/CPAP/SIPAP  BiPAP/CPAP/SIPAP Pt Type Adult  BiPAP/CPAP/SIPAP SERVO  Mask Type Full face mask  Dentures removed? Yes - Placed in denture cup  Mask Size Small  Set Rate 15 breaths/min  Respiratory Rate 36 breaths/min  IPAP 12 cmH20  EPAP 6 cmH2O  Pressure Support 6 cmH20  FiO2 (%) 80 %  Minute Ventilation 14.9  Leak 54  Peak Inspiratory Pressure (PIP) 14  Tidal Volume (Vt) 458  Patient Home Machine No  Patient Home Mask No  Patient Home Tubing No  Auto Titrate No  Press High Alarm 25 cmH2O  CPAP/SIPAP surface wiped down Yes  Device Plugged into RED Power Outlet Yes  Oxygen  Percent 80 %  BiPAP/CPAP /SiPAP Vitals  Resp (!) 36  SpO2 90 %  Bilateral Breath Sounds Diminished  MEWS Score/Color  MEWS Score 6  MEWS Score Color Red

## 2023-05-31 NOTE — Progress Notes (Signed)
 PT Cancellation Note  Patient Details Name: Priscilla Cantrell MRN: [id number] DOB: 11-03-1935   Cancelled Treatment:     PT order received but eval deferred this date - RN advises pt with increased SOB and being placed on bi-pap.  Will follow.   Jannatul Wojdyla 05/31/2023, 11:04 AM

## 2023-05-31 NOTE — Plan of Care (Signed)

## 2023-05-31 NOTE — Progress Notes (Signed)
 Patient continues on the bipap. She is going back and forth between sinus rhythm to sinus tach. Provider again notified. New orders written for metoprolol. Patient is back in normal sinus rhythm with heart rate consistently in the 70s. Call bell in reach. Will continue to monitor.

## 2023-06-01 DIAGNOSIS — J189 Pneumonia, unspecified organism: Secondary | ICD-10-CM | POA: Diagnosis not present

## 2023-06-01 DIAGNOSIS — I5031 Acute diastolic (congestive) heart failure: Secondary | ICD-10-CM | POA: Diagnosis not present

## 2023-06-01 DIAGNOSIS — I471 Supraventricular tachycardia, unspecified: Secondary | ICD-10-CM | POA: Diagnosis not present

## 2023-06-01 DIAGNOSIS — I5033 Acute on chronic diastolic (congestive) heart failure: Secondary | ICD-10-CM | POA: Diagnosis not present

## 2023-06-01 DIAGNOSIS — J441 Chronic obstructive pulmonary disease with (acute) exacerbation: Secondary | ICD-10-CM | POA: Diagnosis not present

## 2023-06-01 DIAGNOSIS — I4891 Unspecified atrial fibrillation: Secondary | ICD-10-CM | POA: Diagnosis not present

## 2023-06-01 DIAGNOSIS — A419 Sepsis, unspecified organism: Secondary | ICD-10-CM | POA: Diagnosis not present

## 2023-06-01 LAB — CBC
HCT: 36.1 % (ref 36.0–46.0)
Hemoglobin: 11.2 g/dL — ABNORMAL LOW (ref 12.0–15.0)
MCH: 28.6 pg (ref 26.0–34.0)
MCHC: 31 g/dL (ref 30.0–36.0)
MCV: 92.3 fL (ref 80.0–100.0)
Platelets: 240 10*3/uL (ref 150–400)
RBC: 3.91 MIL/uL (ref 3.87–5.11)
RDW: 13 % (ref 11.5–15.5)
WBC: 18.4 10*3/uL — ABNORMAL HIGH (ref 4.0–10.5)
nRBC: 0 % (ref 0.0–0.2)

## 2023-06-01 LAB — BASIC METABOLIC PANEL WITH GFR
Anion gap: 12 (ref 5–15)
BUN: 31 mg/dL — ABNORMAL HIGH (ref 8–23)
CO2: 29 mmol/L (ref 22–32)
Calcium: 8.8 mg/dL — ABNORMAL LOW (ref 8.9–10.3)
Chloride: 101 mmol/L (ref 98–111)
Creatinine, Ser: 0.71 mg/dL (ref 0.44–1.00)
GFR, Estimated: >60 mL/min (ref >60)
Glucose, Bld: 142 mg/dL — ABNORMAL HIGH (ref 70–99)
Potassium: 3.8 mmol/L (ref 3.5–5.1)
Sodium: 142 mmol/L (ref 135–145)

## 2023-06-01 LAB — T4, FREE: Free T4: 2.15 ng/dL — ABNORMAL HIGH (ref 0.61–1.12)

## 2023-06-01 MED ORDER — METOPROLOL SUCCINATE ER 25 MG PO TB24
25.0000 mg | ORAL_TABLET | Freq: Every day | ORAL | Status: DC
  Administered 2023-06-02 – 2023-06-06 (×5): 25 mg via ORAL
  Filled 2023-06-01 (×5): qty 1

## 2023-06-01 MED ORDER — FUROSEMIDE 10 MG/ML IJ SOLN
40.0000 mg | Freq: Once | INTRAMUSCULAR | Status: AC
  Administered 2023-06-01: 40 mg via INTRAVENOUS
  Filled 2023-06-01: qty 4

## 2023-06-01 MED ORDER — FUROSEMIDE 10 MG/ML IJ SOLN
40.0000 mg | Freq: Every day | INTRAMUSCULAR | Status: DC
  Administered 2023-06-01 – 2023-06-04 (×4): 40 mg via INTRAVENOUS
  Filled 2023-06-01 (×4): qty 4

## 2023-06-01 MED ORDER — DILTIAZEM HCL ER COATED BEADS 120 MG PO CP24
120.0000 mg | ORAL_CAPSULE | Freq: Every day | ORAL | Status: DC
  Administered 2023-06-01 – 2023-06-02 (×2): 120 mg via ORAL
  Filled 2023-06-01 (×2): qty 1

## 2023-06-01 MED ORDER — APIXABAN 2.5 MG PO TABS
2.5000 mg | ORAL_TABLET | Freq: Two times a day (BID) | ORAL | Status: DC
  Administered 2023-06-01 – 2023-06-17 (×32): 2.5 mg via ORAL
  Filled 2023-06-01 (×32): qty 1

## 2023-06-01 MED ORDER — LEVOTHYROXINE SODIUM 50 MCG PO TABS
75.0000 ug | ORAL_TABLET | Freq: Every day | ORAL | Status: DC
  Administered 2023-06-02 – 2023-06-17 (×16): 75 ug via ORAL
  Filled 2023-06-01 (×16): qty 1

## 2023-06-01 MED ORDER — LORAZEPAM 2 MG/ML IJ SOLN
1.0000 mg | Freq: Once | INTRAMUSCULAR | Status: AC
  Administered 2023-06-01: 1 mg via INTRAVENOUS
  Filled 2023-06-01: qty 1

## 2023-06-01 NOTE — Consult Note (Signed)
 Cardiology Consultation   Patient ID: Priscilla Cantrell MRN: [id number]; DOB: [birth year]  Admit date: 05/30/2023 Date of Consult: 06/01/2023  PCP:  Shelvia Dick, MD   [facility] Providers Cardiologist: New to Dr Micael Adas    Patient Profile:   Priscilla Cantrell is a 88 y.o. female with a hx of oxygen  dependent COPD with 2LNC, hypothyroidism, HTN, depression,  who is being seen 06/01/2023 for the evaluation of CHF at the request of Dr Alfonse Angle.  History of Present Illness:   Priscilla Cantrell with above PMH presented to ER 05/30/23 for increased productive cough, SOB with exertion for 2 weeks. She was found to have fever, tachycardiac, and hypoxia with Pox 70% on 2LNC at ER.   Work up from 05/30/23 at admission showed albumin 3.1, glucose 153, Cr 0.94, and GFR 59 on CMP. WBC 18800, Hgb 11.2. Resp viral swab negative. UA + Hgb, leukocytes, bacteria. Urine culture had inadequate growth. Blood culture x2 had no growth so far. CXR showed  Interval development of bilateral lower lobes, right greater than left interstitial and airspace opacities. Bilateral trace pleural effusion. She was admitted to hospitalist for sepsis 2/2 CAP and COPD exacerbation. She was treated with steroid, antibiotics, bronchodilators. Subsequent labs on 05/31/23 showed BNP 834. Procal 2.7. TSH 0.313. Strep pneumo negative. She was started on IV Lasix  40 mg daily.  Cardiology consult was requested today for CHF.  She states she had progressive SOB for 2 weeks. She uses 2LNC at baseline. She describes air hunger. She had upper abdominal pain when she first came in. She denied any chest pain, heart palpitation, dizziness, or syncope. She felt no improvement of her symptoms over the past 2-3 days. She was not able to be weaned off BIPAP, husband at bedside reports she had immediate hypoxia when BIPAP was removed. She had quit smoking for >15 years. She has never had the diagnosis of CHF or A fib in the past.   Per chart  review, she does not see a cardiologist at baseline.  She takes amlodipine  10 mg for hypertension.  CT coronary study from 06/18/2010 was done for chest pain revealing mixed plaque with mild stenosis in the distal left main and the mid LAD; No significant disease of RCA and distal LAD/limited by artifact; Coronary calcium  score 69 Agatston units, placing the patient in the 49th percentile for age and gender.  Echocardiogram from 08/30/2014 showed LVEF 60 to 65%, grade 1 DD, no significant valvular disease, PA peak pressure 24 mmHg.  Repeat echocardiogram on 06/21/2021 with LVEF 60 to 65%, no regional motion abnormality, grade 1 DD, normal RV, mild LAE, PASP mildly elevated 40.2 mmHg, no significant valvular disease.    Past Medical History:  Diagnosis Date   Allergic rhinitis    with upper airway cough: as of 07/2017 pulm f/u she was instructed to use astelin , flonase , and saline more consistently   BPPV (benign paroxysmal positional vertigo) 03/2018   C. difficile colitis    Chest pain, non-cardiac    Cardiac CT showed no signif obstructive dz, mildly elevated calcium  level (Dr. Audery Blazing).   Chronic cystitis with hematuria    Dr. Lyndol Santee   Chronic hypoxemic respiratory failure (HCC) 02/02/2015   2L oxygen  24/7   Chronic renal insufficiency, stage 2 (mild)    GFR approx 60   Cold hands    NCS/EMGs normal.  Hand dysesthesias per neuro--reassured   COPD (chronic obstructive pulmonary disease) (HCC)    GOLD 4.  spirometry  01/10/09 FEV 0.97(52%), FEV1% 47.  With chronic bronchitis as of 07/2017.   DDD (degenerative disc disease), cervical    1998 MRI C5-6 impingemt (left).  MRI 2021 (neurol) 1 level canal stenosis, 1 level foraminal stenosis--->PT   DDD (degenerative disc disease), lumbar    Depression with anxiety 04/15/2011   Diarrheal disease Summer 2017   ? 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 Dr Andriette Keeling).  As of 10/05/15 pt reports GI dx'd her with C diff and rx'd more flagyl  and she is also on cholestyramine .  As of 02/2016, pt's sx's resolved completely.   Diverticulosis of colon    Procto '97 and colonoscopy 2002   Dysplastic nevus of upper extremity 04/2014   R tricep (Dr. Steen Eden)   Edema of both lower extremities due to peripheral venous insufficiency    Varicosities bilat, swelling L>R.  R baker's cyst.  Got vein clinic eval 02/2018->sclerotherapy recommended but since no hemorrhage or ulceration, insurance will not cover the procedure.   Family history of adverse reaction to anesthesia    mother died when patient age 73 receiving anesthesia for thyroid  surgery   Fibrocystic breast disease    w/fibroadenoma.  Bx's showed NO atypia (Dr. Linell Rhymes).  Mammo neg 2008.   GERD (gastroesophageal reflux disease)    History of double vision    Ophthalmologist, Dr. Lasandra Points, is further evaluating this with MRI  orbits and limited brain (myesthenia gravis testing neg 07/2010)   History of home oxygen  therapy    uses 2 liters at hs   Hypertension    EKG 03/2010 normal   Hypothyroidism    Hashimoto's, dx'd 1989   Idiopathic peripheral neuropathy 04/2018   Sensory (numb feet) S1 distribution bilat, c/w spinal stenosis sensory neuropathy (no pain). 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  Labs [place], MRI C spine->some spondylosis/stenosis.  UE NCS/EMS: normal 04/2019.   Lesion of right native kidney 2017   found on CT 02/2015.  F/u MRI 03/31/15 showed it to be smaller and less likely of any significance but repeat CT renal protocol in 79mo was recommended by radiology.  This was done 10/26/15 and showed no interval change in the lesion, but b/c the lesion enhances with contrast, radiol rec'd urol consult (b/c pap renal RCC could not be excluded).  Saw urol 03/06/16--stable on u/s 09/2016 and 09/2017.   OAB (overactive bladder)     Osteoarthritis, hip, bilateral    she is s/p bilat THA as of 02/2018.   Osteoporosis    radius, T score -2.5->rec'd alendronate  08/2019->plan rpt DEXA 08/2021.   Pneumonia    Postmenopausal atrophic vaginitis    improved with estrace 2 X/week.   Prolapse of vaginal cuff after hysterectomy    10/2018: Dr. Zackary Heron. Mild colpocele 04/2019 per GYN (no surgery)   Pulmonary nodule    CT chest 12/10, June 2011.  No nodule on CT 06/2010.   Recurrent Clostridium difficile diarrhea    Episode 09/19/2016: resolved with prolonged course of flagyl .  11/2016 recurrence treated with 10d of Dificid (Fidaxomicin) by GI (Dr. Andriette Keeling).   Recurrent UTI    likely related to pt's atrophic vaginitis.  Urol started vaginal estrace 03/2016.   Rib fracture 09/2018   R 7th, laterally-->sustained when her dog pulled her over and she fell.   RLS (restless legs syndrome)    neuro->gabapentin  trial 2020/21   Shingles 02/2014   R abd/flank  Spinal stenosis of lumbar region with neurogenic claudication    2008 MRI (L3-4, L4-5, L5-S1).  +S1 distribution sensory loss bilat    Past Surgical History:  Procedure Laterality Date   ABDOMINAL HYSTERECTOMY  1978   no BSO per pt--nonmalignant reasons   APPENDECTOMY     BREAST CYST EXCISION     Last screening mammogram 10/2010 was normal.   CHOLECYSTECTOMY  2001   COLONOSCOPY  04/2005; 12/06/2015   2007 NORMAL.  2017 adenomatous polyp x 1.  Mod sigmoid diverticulosis (Dr. Andriette Keeling).     DEXA  05/18/2017; 08/7019   2019 NORMAL (T-score 0.1).  08/2019->T score -2.5->alendronate  started.  03/2022 T score -2.7   HIP CLOSED REDUCTION Right 12/25/2015   Procedure: CLOSED REDUCTION HIP;  Surgeon: Janeth Medicus, MD;  Location: [facility];  Service: Orthopedics;  Laterality: Right;   NCV/EMS  10/2018   chronic and symmetric sensorimotor axonal polyneuropathy affecting the lower extremities   TONSILLECTOMY     TOTAL HIP ARTHROPLASTY Right 11/01/2014   Procedure: RIGHT TOTAL HIP  ARTHROPLASTY;  Surgeon: Hazle Lites, MD;  Location: [facility];  Service: Orthopedics;  Laterality: Right;   TOTAL HIP ARTHROPLASTY Left 12/08/2017   Procedure: LEFT TOTAL HIP ARTHROPLASTY ANTERIOR APPROACH;  Surgeon: Claiborne Crew, MD;  Location: [facility];  Service: Orthopedics;  Laterality: Left;  70 mins   TOTAL HIP REVISION Right 02/10/2023   Procedure: RIGHT TOTAL HIP REVISION;  Surgeon: Claiborne Crew, MD;  Location: [facility];  Service: Orthopedics;  Laterality: Right;   TRANSTHORACIC ECHOCARDIOGRAM  08/2014   2016 EF 60-65%, grade I diast dysfxn. 06/21/21 unchanged.   TUBAL LIGATION  1974     Home Medications:  Prior to Admission medications   Medication Sig Start Date End Date Taking? Authorizing Provider  acetaminophen  (TYLENOL ) 325 MG tablet Take 2 tablets (650 mg total) by mouth every 6 (six) hours. Patient taking differently: Take 650 mg by mouth every 6 (six) hours as needed for moderate pain (pain score 4-6) or fever. 02/14/23  Yes Gonfa, Taye T, MD  albuterol  (PROAIR  HFA) 108 (90 Base) MCG/ACT inhaler Inhale 2 puffs into the lungs every 4 (four) hours as needed for wheezing or shortness of breath. 12/30/22  Yes Mannam, Praveen, MD  albuterol  (PROVENTIL ) (2.5 MG/3ML) 0.083% nebulizer solution Take 2.5 mg by nebulization every 6 (six) hours as needed for wheezing or shortness of breath.   Yes [provider]  amLODipine  (NORVASC ) 10 MG tablet Take 1 tablet (10 mg total) by mouth daily. 04/14/23  Yes McGowen, Minetta Aly, MD  azithromycin  (ZITHROMAX ) 250 MG tablet 2 tabs po qd x 1d, then 1 tab po qd x 4d 05/28/23  Yes McGowen, Minetta Aly, MD  fluticasone  (FLONASE ) 50 MCG/ACT nasal spray Place 2 sprays into both nostrils daily. 05/28/23  Yes McGowen, Minetta Aly, MD  Fluticasone -Umeclidin-Vilant (TRELEGY ELLIPTA ) 100-62.5-25 MCG/ACT AEPB Inhale 1 puff into the lungs daily. 05/07/23  Yes Antonio Baumgarten, NP  levothyroxine  (SYNTHROID ) 100 MCG tablet TAKE 1 TABLET BY MOUTH EVERY DAY 04/14/23  Yes  McGowen, Minetta Aly, MD  lidocaine  (XYLOCAINE ) 2 % solution Apply 1/2 ml to affected area of mouth 3 times per day as needed for pain 03/05/23  Yes McGowen, Minetta Aly, MD  pantoprazole  (PROTONIX ) 40 MG tablet Take 1 tablet (40 mg total) by mouth 2 (two) times daily. 04/22/23  Yes McGowen, Minetta Aly, MD  predniSONE  (DELTASONE ) 10 MG tablet 4 tabs po every day x 4d, then 3 tabs po every day  x 4d, then 2 tabs po every day x 4d, then 1 tab po every day x 4d 05/28/23  Yes McGowen, Minetta Aly, MD  venlafaxine  (EFFEXOR ) 50 MG tablet Take 50 mg by mouth 2 (two) times daily. 11/28/22  Yes [provider]  Misc. Devices (PULSE OXIMETER) MISC Check oxygen  level as needed 03/12/22   McGowen, Minetta Aly, MD    Inpatient Medications: Scheduled Meds:  acetylcysteine  3 mL Nebulization BID   diltiazem  120 mg Oral Daily   enoxaparin  (LOVENOX ) injection  40 mg Subcutaneous Q24H   fluticasone   2 spray Each Nare Daily   furosemide   40 mg Intravenous Daily   guaiFENesin   1,200 mg Oral BID   ipratropium  0.5 mg Nebulization Q6H   levalbuterol   0.63 mg Nebulization Q6H   [START ON 06/02/2023] levothyroxine   75 mcg Oral Q0600   methylPREDNISolone  (SOLU-MEDROL ) injection  40 mg Intravenous Q12H   metoprolol tartrate  12.5 mg Oral BID   pantoprazole   40 mg Oral BID   polyethylene glycol  17 g Oral Daily   senna-docusate  2 tablet Oral BID   venlafaxine   50 mg Oral BID   Continuous Infusions:  levofloxacin  (LEVAQUIN ) IV 100 mL/hr at 05/31/23 1658   PRN Meds: acetaminophen  **OR** acetaminophen , ondansetron  **OR** ondansetron  (ZOFRAN ) IV  Allergies:    Allergies  Allergen Reactions   Losartan  Other (See Comments)    hyperkalemia   Penicillins Hives, Swelling and Rash    Has patient had a PCN reaction causing immediate rash, facial/tongue/throat swelling, SOB or lightheadedness with hypotension: YES Has patient had a PCN reaction causing severe rash involving mucus membranes or skin necrosis: NO Has patient  had a PCN reaction that required hospitalization NO Has patient had a PCN reaction occurring within the last 10 years: NO If all of the above answers are "NO", then may proceed with Cephalosporin use.    Cefixime [Kdc:Cefixime] Other (See Comments)    unspecified   Gabapentin  Other (See Comments)    Feels woozy   Indocin [Indomethacin] Other (See Comments)    Painful tongue and throat   Singulair  [Montelukast  Sodium]     Chest tightness, decreased mental clarity   Sulfa Antibiotics Other (See Comments)    unspecified   Ace Inhibitors Other (See Comments)    cough   Statins Other (See Comments)    Myalgias     Social History:   Social History   Socioeconomic History   Marital status: Widowed    Spouse name: Not on file   Number of children: 1   Years of education: Not on file   Highest education level: Not on file  Occupational History   Occupation: SECRETARY    Employer: [employer]  Tobacco Use   Smoking status: Former    Current packs/day: 0.00    Average packs/day: 1.5 packs/day for 34.0 years (51.0 ttl pk-yrs)    Types: Cigarettes    Start date: 80    Quit date: 02/03/1990    Years since quitting: 33.3   Smokeless tobacco: Never  Vaping Use   Vaping status: Never Used  Substance and Sexual Activity   Alcohol  use: Yes    Comment: Rarely   Drug use: No   Sexual activity: Not Currently    Comment: 1st intercourse 27 yo-1 partner  Other Topics Concern   Not on file  Social History Narrative   Widowed since fall 2014   Lives in [place], [place].  Worked as Radiographer, therapeutic, retired 2017.   She has support of her daughter and grandson. She is independent with all care needs and transportation at this time. 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    She reports she is on SSI and receives another separate check    She reports her work hx included working in Education officer, environmental for financial  and Haematologist businesses and volunteered in a [employer]    She reports having a short hair dog for 2 years  but denies noting any allergic reactions   Right handed   Two story home   Social Drivers of Corporate investment banker Strain: Low Risk  (03/19/2022)   Overall Financial Resource Strain (CARDIA)    Difficulty of Paying Living Expenses: Not hard at all  Food Insecurity: No Food Insecurity (05/30/2023)   Hunger Vital Sign    Worried About Running Out of Food in the Last Year: Never true    Ran Out of Food in the Last Year: Never true  Transportation Needs: No Transportation Needs (05/30/2023)   PRAPARE - Administrator, Civil Service (Medical): No    Lack of Transportation (Non-Medical): No  Physical Activity: Sufficiently Active (03/19/2022)   Exercise Vital Sign    Days of Exercise per Week: 7 days    Minutes of Exercise per Session: 150+ min  Stress: No Stress Concern Present (03/19/2022)   [employer] of Occupational Health - Occupational Stress Questionnaire    Feeling of Stress : Not at all  Social Connections: Socially Integrated (05/30/2023)   Social Connection and Isolation Panel [NHANES]    Frequency of Communication with Friends and Family: More than three times a week    Frequency of Social Gatherings with Friends and Family: More than three times a week    Attends Religious Services: More than 4 times per year    Active Member of [employer] or Organizations: Yes    Attends Banker Meetings: More than 4 times per year    Marital Status: Living with partner  Intimate Partner Violence: Not At Risk (05/30/2023)   Humiliation, Afraid, Rape, and Kick questionnaire    Fear of Current or Ex-Partner: No    Emotionally Abused: No    Physically Abused: No    Sexually Abused: No    Family History:    Family History  Problem Relation Age of Onset   Goiter Mother    Psoriasis Father        od liver   Cirrhosis Father    Diabetes Daughter         Type 2   Stroke Maternal Grandfather    Stroke Paternal Grandmother    Hypertension Paternal Grandmother    Hernia Paternal Grandmother    Cancer Paternal Grandfather        lung   Cancer Maternal Aunt        cancer of the stomach     ROS:  Constitutional: fever  Eyes: Denied vision change or loss Ears/Nose/Mouth/Throat: Denied ear ache, sore throat, coughing, sinus pain Cardiovascular: see HPI  Respiratory: see HPI  Gastrointestinal:  + abdominal pain Genital/Urinary: Denied dysuria, hematuria, urinary frequency/urgency Musculoskeletal: Denied muscle ache, joint pain, weakness Skin: Denied rash, wound Neuro: Denied headache, dizziness, syncope Psych: Denied history of depression/anxiety  Endocrine: Denied history of diabetes     Physical Exam/Data:   Vitals:   06/01/23  0865 06/01/23 0800 06/01/23 1035 06/01/23 1100  BP:      Pulse:  90 92   Resp:  (!) 22    Temp:      TempSrc:      SpO2: 93%  91% 94%  Weight:      Height:        Intake/Output Summary (Last 24 hours) at 06/01/2023 1402 Last data filed at 05/31/2023 2000 Gross per 24 hour  Intake 13.83 ml  Output 1650 ml  Net -1636.17 ml      05/30/2023   10:49 AM 05/28/2023   11:10 AM 05/21/2023    4:26 PM  Last 3 Weights  Weight (lbs) 121 lb 14.6 oz 117 lb 12.8 oz 119 lb 0.8 oz  Weight (kg) 55.3 kg 53.434 kg 54 kg     Body mass index is 22.3 kg/m.   Vitals:  Vitals:   06/01/23 1035 06/01/23 1100  BP:    Pulse: 92   Resp:    Temp:    SpO2: 91% 94%   General Appearance: In no apparent distress, laying in bed, thin built HEENT: Normocephalic, atraumatic.  Neck: Supple, trachea midline, difficult assess JVDs due to position and wearing BIPAP  Cardiovascular: Irregular rhythm, S1S2, no murmur  Respiratory: Resting breathing unlabored, lungs sounds with bilateral crackles and scattered wheezing, on continuous BIPAP, pox 91% Gastrointestinal: Bowel sounds positive, abdomen soft, non-tender   Extremities: Able to move all extremities in bed without difficulty, no edema of BLE Musculoskeletal: Normal muscle bulk and tone Skin: Intact, warm, dry. No rashes or petechiae noted in exposed areas.  Neurologic: Alert, oriented to person, place and time. No cognitive deficit Psychiatric: Normal affect. Mood is appropriate.     EKG:  The EKG was personally reviewed and demonstrates:    EKG from 05/30/2023 revealing sinus rhythm 86 bpm, PACs, old RBBB  Repeat EKG 05/31/23 1:28 showed A fib RVR 117bpm, old RBBB  Telemetry:  Telemetry was personally reviewed and demonstrates:    Sinus rhythm, frequent PAC, PVCs, paroxysmal SVT up to 160s intermittently throughout monitor/self limiting, no A fib noted   Relevant CV Studies:   Echo from 06/21/21:   1. Left ventricular ejection fraction, by estimation, is 60 to 65%. The  left ventricle has normal function. The left ventricle has no regional  wall motion abnormalities. Left ventricular diastolic parameters are  consistent with Grade I diastolic  dysfunction (impaired relaxation).   2. Right ventricular systolic function is normal. The right ventricular  size is normal. There is mildly elevated pulmonary artery systolic  pressure.   3. Left atrial size was mildly dilated.   4. The mitral valve is normal in structure. No evidence of mitral valve  regurgitation. No evidence of mitral stenosis.   5. The aortic valve is normal in structure. Aortic valve regurgitation is  not visualized. No aortic stenosis is present.   6. The inferior vena cava is normal in size with greater than 50%  respiratory variability, suggesting right atrial pressure of 3 mmHg.      Laboratory Data:  High Sensitivity Troponin:  No results for input(s): "TROPONINIHS" in the last 720 hours.   Chemistry Recent Labs  Lab 05/31/23 0411 05/31/23 1650 06/01/23 0837  NA 136 139 142  K 3.8 4.2 3.8  CL 103 106 101  CO2 24 24 29   GLUCOSE 112* 125* 142*  BUN  28* 26* 31*  CREATININE 0.70 0.59 0.71  CALCIUM  8.5* 8.4* 8.8*  GFRNONAA >60 >60 >  60  ANIONGAP 9 9 12     Recent Labs  Lab 05/30/23 0042 05/31/23 1650  PROT 6.1* 5.1*  ALBUMIN 3.1* 2.3*  AST 19 16  ALT 21 24  ALKPHOS 66 56  BILITOT 1.0 0.8   Lipids No results for input(s): "CHOL", "TRIG", "HDL", "LABVLDL", "LDLCALC", "CHOLHDL" in the last 168 hours.  Hematology Recent Labs  Lab 05/30/23 0042 05/31/23 0411 06/01/23 0337  WBC 18.8* 19.2* 18.4*  RBC 3.75* 3.73* 3.91  HGB 11.2* 10.8* 11.2*  HCT 34.2* 34.3* 36.1  MCV 91.2 92.0 92.3  MCH 29.9 29.0 28.6  MCHC 32.7 31.5 31.0  RDW 13.1 13.1 13.0  PLT 269 249 240   Thyroid   Recent Labs  Lab 05/31/23 1026  TSH 0.313*    BNP Recent Labs  Lab 05/31/23 1026  BNP 834.8*    DDimer No results for input(s): "DDIMER" in the last 168 hours.   Radiology/Studies:  [facility] Chest Port 1 View Result Date: 05/30/2023 CLINICAL DATA:  Questionable sepsis - evaluate for abnormality. Dyspnea EXAM: PORTABLE CHEST 1 VIEW COMPARISON:  Chest x-ray 05/21/2023, CT cardiac 06/18/2010 FINDINGS: The heart and mediastinal contours are unchanged. Atherosclerotic plaque. Interval development of bilateral lower lobes, right greater than left interstitial and airspace opacities. Chronic coarsened interstitial markings. Bilateral trace pleural effusion. No pneumothorax. No acute osseous abnormality. IMPRESSION: 1. Interval development of bilateral lower lobes, right greater than left interstitial and airspace opacities. 2. Bilateral trace pleural effusion. 3. Aortic Atherosclerosis (ICD10-I70.0) and Emphysema (ICD10-J43.9). Electronically Signed   By: Morgane  Naveau M.D.   On: 05/30/2023 01:21     Assessment and Plan:   Acute on chronic hypoxic respiratory failure  Elevated BNP concerning for CHF  - presented with increased DOE, cough, wheezing, fever, hypoxia for 2 weeks - BNP 834 yesterday - CXR Interval development of bilateral lower lobes, right  greater than left interstitial and airspace opacities - receiving treatment for CAP and COPD exacerbation, lacking improvement, dependent BIPAP - will check Echo to assess for pulmonary HTN and RV failure - IV lasix  given by primary team, UOP over the past 24 hrs,clinical exam is difficult given poor respiratory status, will continue IV diuresis with Lasix  40mg  daily  - GDMT: pending Echo finding   Paroxysmal SVT Paroxysmal A fib RVR  - EKG on 4/27 concerning for A fib, telemetry showed sinus rhythm with frequent PVC/PACs, and intermittent SVT episodes up to 160bpm that are self limiting, reviewed with MD  - TSH low 0.313, consider check FT4  - with underlying lung disease and albuterol  use, likely have recurrence, will start cardizem CD 120mg  daily for suppression of SVT and rate control, will check Echo, if EF down, switch to beta blocker  - would assess A fib burden with 2 week monitor, will start DOAC with Eliquis 5mg  bid for anticoagulation meanwhile for  CHA2DS2-VASc Score = 4 , monitor for bleeding   CAP COPD exacerbation  HTN CKD II Hypothyroidism  - per primary team      Risk Assessment/Risk Scores:  { [place]  Heart Association (NYHA) Functional Class NYHA Class II  CHA2DS2-VASc Score = 4   This indicates a 4.8% annual risk of stroke. The patient's score is based upon: CHF History: 0 HTN History: 1 Diabetes History: 0 Stroke History: 0 Vascular Disease History: 0 Age Score: 2 Gender Score: 1     For questions or updates, please contact [facility] Please consult [email or web address] for contact info under    Signed, Makaleigh Reinard  Blaine Hari, NP  06/01/2023 2:02 PM

## 2023-06-01 NOTE — Progress Notes (Signed)
 [facility]  PROGRESS NOTE  LORRIANE RAJSKI WUJ:[id number] DOB: 26-Aug-1935 DOA: 05/30/2023 PCP: Shelvia Dick, MD   Brief HPI:    88 y.o. female with medical history significant for CKD stage II, depression, chronic hypoxic respiratory failure due to COPD on 2 L at baseline being admitted to the hospital with sepsis due to community-acquired pneumonia.  History is provided by the patient, as well as her niece who is at the bedside.  She has been coughing up green sputum for the last couple of weeks, feeling more short of breath with exertion.  This morning at home she was noted to be saturating about 70% on 2 L nasal cannula workup in the ER as noted below showed evidence of community-acquired pneumonia, she was started on empiric IV antibiotics    Assessment/Plan:   Sepsis due to community-acquired pneumonia - Presented with fever, tachycardia, tachypnea, leukocytosis - Source of infection likely community-acquired pneumonia -Procalcitonin 2.70 - Started on IV doxycycline ; doxycycline  discontinued -Patient started on IV Levaquin  500 mg daily    Acute on chronic hypoxic respiratory failure-due to community-acquired pneumonia superimposed on COPD - Continue Solu-Medrol  40 mg IV twice daily -Xopenex  nebulizer for 6 hours, will add ipratropium nebulizer every 6 hours -Will start Mucomyst nebulizer treatment - Continue BiPAP  Acute on chronic diastolic CHF -Echocardiogram from 5/23 showed EF of 65% with grade 1 diastolic dysfunction -Her BNP obtained is elevated at 838 -Will give Lasix  40 mg IV with good diuresis - Will give additional Lasix  40 mg IV and consult cardiology; as patient continues to require BiPAP due to pulmonary edema  Intake/Output Summary (Last 24 hours) at 06/01/2023 1516 Last data filed at 05/31/2023 2000 Gross per 24 hour  Intake 13.83 ml  Output 1650 ml  Net -1636.17 ml     Hypertension -Amlodipine  on hold due to low blood pressure    CKD 2 -renal  function appears to be at baseline -Started on Lasix  as above - Follow BMP in am   Hypothyroidism -Synthroid    GERD -Protonix     Medications     acetylcysteine  3 mL Nebulization BID   enoxaparin  (LOVENOX ) injection  40 mg Subcutaneous Q24H   fluticasone   2 spray Each Nare Daily   guaiFENesin   1,200 mg Oral BID   ipratropium  0.5 mg Nebulization Q6H   levalbuterol   0.63 mg Nebulization Q6H   levothyroxine   100 mcg Oral Q0600   methylPREDNISolone  (SOLU-MEDROL ) injection  40 mg Intravenous Q12H   metoprolol tartrate  12.5 mg Oral BID   pantoprazole   40 mg Oral BID   polyethylene glycol  17 g Oral Daily   senna-docusate  2 tablet Oral BID   venlafaxine   50 mg Oral BID     Data Reviewed:   CBG:  No results for input(s): "GLUCAP" in the last 168 hours.  SpO2: 93 % O2 Flow Rate (L/min): 7 L/min FiO2 (%): 80 %    Vitals:   06/01/23 0248 06/01/23 0405 06/01/23 0759 06/01/23 0800  BP:  (!) 141/71    Pulse: 78 90  90  Resp: 20 19  (!) 22  Temp:  (!) 97.5 F (36.4 C)    TempSrc:      SpO2: 93% 97% 93%   Weight:      Height:          Data Reviewed:  Basic Metabolic Panel: Recent Labs  Lab 05/30/23 0042 05/31/23 0411 05/31/23 1650  NA 136 136 139  K 3.7 3.8  4.2  CL 99 103 106  CO2 24 24 24   GLUCOSE 153* 112* 125*  BUN 24* 28* 26*  CREATININE 0.94 0.70 0.59  CALCIUM  8.5* 8.5* 8.4*    CBC: Recent Labs  Lab 05/30/23 0042 05/31/23 0411 06/01/23 0337  WBC 18.8* 19.2* 18.4*  NEUTROABS 16.8*  --   --   HGB 11.2* 10.8* 11.2*  HCT 34.2* 34.3* 36.1  MCV 91.2 92.0 92.3  PLT 269 249 240    LFT Recent Labs  Lab 05/30/23 0042 05/31/23 1650  AST 19 16  ALT 21 24  ALKPHOS 66 56  BILITOT 1.0 0.8  PROT 6.1* 5.1*  ALBUMIN 3.1* 2.3*     Antibiotics: Anti-infectives (From admission, onward)    Start     Dose/Rate Route Frequency Ordered Stop   05/31/23 1730  levofloxacin  (LEVAQUIN ) IVPB 500 mg        500 mg 100 mL/hr over 60 Minutes Intravenous  Every 24 hours 05/31/23 1631     05/30/23 0900  doxycycline  (VIBRAMYCIN ) 100 mg in sodium chloride  0.9 % 250 mL IVPB  Status:  Discontinued        100 mg 125 mL/hr over 120 Minutes Intravenous Every 12 hours 05/30/23 0848 05/31/23 1631   05/30/23 0045  aztreonam (AZACTAM) 2 g in sodium chloride  0.9 % 100 mL IVPB        2 g 200 mL/hr over 30 Minutes Intravenous  Once 05/30/23 0037 05/30/23 0323   05/30/23 0045  metroNIDAZOLE  (FLAGYL ) IVPB 500 mg        500 mg 100 mL/hr over 60 Minutes Intravenous  Once 05/30/23 0037 05/30/23 0323   05/30/23 0045  vancomycin  (VANCOCIN ) IVPB 1000 mg/200 mL premix        1,000 mg 200 mL/hr over 60 Minutes Intravenous  Once 05/30/23 0037 05/30/23 0323        DVT prophylaxis: Lovenox   Code Status: Full code  Family Communication:    CONSULTS    Subjective   Continues to require BiPAP.   Objective    Physical Examination:   Appears in mild respite distress BiPAP in place Lungs decreased breath sounds lung bases Extremities no edema   Status is: Inpatient:             Ozell Blunt   [facility] If 7PM-7AM, please contact night-coverage at [email or web address], Office  [phone]   06/01/2023, 8:11 AM  LOS: 2 days

## 2023-06-01 NOTE — Progress Notes (Signed)
 PHARMACY - ANTICOAGULATION CONSULT NOTE  Pharmacy Consult for Apixaban Indication: atrial fibrillation  Allergies  Allergen Reactions   Losartan  Other (See Comments)    hyperkalemia   Penicillins Hives, Swelling and Rash    Has patient had a PCN reaction causing immediate rash, facial/tongue/throat swelling, SOB or lightheadedness with hypotension: YES Has patient had a PCN reaction causing severe rash involving mucus membranes or skin necrosis: NO Has patient had a PCN reaction that required hospitalization NO Has patient had a PCN reaction occurring within the last 10 years: NO If all of the above answers are "NO", then may proceed with Cephalosporin use.    Cefixime [Kdc:Cefixime] Other (See Comments)    unspecified   Gabapentin  Other (See Comments)    Feels woozy   Indocin [Indomethacin] Other (See Comments)    Painful tongue and throat   Singulair  [Montelukast  Sodium]     Chest tightness, decreased mental clarity   Sulfa Antibiotics Other (See Comments)    unspecified   Ace Inhibitors Other (See Comments)    cough   Statins Other (See Comments)    Myalgias     Patient Measurements: Height: 5\' 2"  (157.5 cm) Weight: 55.3 kg (121 lb 14.6 oz) IBW/kg (Calculated) : 50.1 HEPARIN DW (KG): 55.3  Vital Signs: Temp: 97.5 F (36.4 C) (04/28 0405) BP: 141/71 (04/28 0405) Pulse Rate: 68 (04/28 1410)  Labs: Recent Labs    05/30/23 0042 05/31/23 0411 05/31/23 1650 06/01/23 0337 06/01/23 0837  HGB 11.2* 10.8*  --  11.2*  --   HCT 34.2* 34.3*  --  36.1  --   PLT 269 249  --  240  --   LABPROT 14.1  --   --   --   --   INR 1.1  --   --   --   --   CREATININE 0.94 0.70 0.59  --  0.71    Estimated Creatinine Clearance: 39.9 mL/min (by C-G formula based on SCr of 0.71 mg/dL).   Medical History: Past Medical History:  Diagnosis Date   Allergic rhinitis    with upper airway cough: as of 07/2017 pulm f/u she was instructed to use astelin , flonase , and saline more  consistently   BPPV (benign paroxysmal positional vertigo) 03/2018   C. difficile colitis    Chest pain, non-cardiac    Cardiac CT showed no signif obstructive dz, mildly elevated calcium  level (Dr. Audery Blazing).   Chronic cystitis with hematuria    Dr. Lyndol Santee   Chronic hypoxemic respiratory failure (HCC) 02/02/2015   2L oxygen  24/7   Chronic renal insufficiency, stage 2 (mild)    GFR approx 60   Cold hands    NCS/EMGs normal.  Hand dysesthesias per neuro--reassured   COPD (chronic obstructive pulmonary disease) (HCC)    GOLD 4.  spirometry 01/10/09 FEV 0.97(52%), FEV1% 47.  With chronic bronchitis as of 07/2017.   DDD (degenerative disc disease), cervical    1998 MRI C5-6 impingemt (left).  MRI 2021 (neurol) 1 level canal stenosis, 1 level foraminal stenosis--->PT   DDD (degenerative disc disease), lumbar    Depression with anxiety 04/15/2011   Diarrheal disease Summer 2017   ? 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 Dr Andriette Keeling).  As of 10/05/15 pt reports GI dx'd her with C diff and rx'd more flagyl  and she is also on cholestyramine .  As of 02/2016, pt's sx's resolved completely.   Diverticulosis of colon  Procto '97 and colonoscopy 2002   Dysplastic nevus of upper extremity 04/2014   R tricep (Dr. Steen Eden)   Edema of both lower extremities due to peripheral venous insufficiency    Varicosities bilat, swelling L>R.  R baker's cyst.  Got vein clinic eval 02/2018->sclerotherapy recommended but since no hemorrhage or ulceration, insurance will not cover the procedure.   Family history of adverse reaction to anesthesia    mother died when patient age 89 receiving anesthesia for thyroid  surgery   Fibrocystic breast disease    w/fibroadenoma.  Bx's showed NO atypia (Dr. Linell Rhymes).  Mammo neg 2008.   GERD (gastroesophageal reflux disease)    History of double vision    Ophthalmologist, Dr.  Lasandra Points, is further evaluating this with MRI  orbits and limited brain (myesthenia gravis testing neg 07/2010)   History of home oxygen  therapy    uses 2 liters at hs   Hypertension    EKG 03/2010 normal   Hypothyroidism    Hashimoto's, dx'd 1989   Idiopathic peripheral neuropathy 04/2018   Sensory (numb feet) S1 distribution bilat, c/w spinal stenosis sensory neuropathy (no pain). 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  Labs [place], MRI C spine->some spondylosis/stenosis.  UE NCS/EMS: normal 04/2019.   Lesion of right native kidney 2017   found on CT 02/2015.  F/u MRI 03/31/15 showed it to be smaller and less likely of any significance but repeat CT renal protocol in 49mo was recommended by radiology.  This was done 10/26/15 and showed no interval change in the lesion, but b/c the lesion enhances with contrast, radiol rec'd urol consult (b/c pap renal RCC could not be excluded).  Saw urol 03/06/16--stable on u/s 09/2016 and 09/2017.   OAB (overactive bladder)    Osteoarthritis, hip, bilateral    she is s/p bilat THA as of 02/2018.   Osteoporosis    radius, T score -2.5->rec'd alendronate  08/2019->plan rpt DEXA 08/2021.   Pneumonia    Postmenopausal atrophic vaginitis    improved with estrace 2 X/week.   Prolapse of vaginal cuff after hysterectomy    10/2018: Dr. Zackary Heron. Mild colpocele 04/2019 per GYN (no surgery)   Pulmonary nodule    CT chest 12/10, June 2011.  No nodule on CT 06/2010.   Recurrent Clostridium difficile diarrhea    Episode 09/19/2016: resolved with prolonged course of flagyl .  11/2016 recurrence treated with 10d of Dificid (Fidaxomicin) by GI (Dr. Andriette Keeling).   Recurrent UTI    likely related to pt's atrophic vaginitis.  Urol started vaginal estrace 03/2016.   Rib fracture 09/2018   R 7th, laterally-->sustained when her dog pulled her over and she fell.   RLS (restless legs syndrome)    neuro->gabapentin  trial 2020/21    Shingles 02/2014   R abd/flank   Spinal stenosis of lumbar region with neurogenic claudication    2008 MRI (L3-4, L4-5, L5-S1).  +S1 distribution sensory loss bilat    Medications:  No prior to admission anticoagulant medication listed Currently on enoxaparin  40 mg subcutaneous daily for DVT prophylaxis, last dose 4/28 1053  Assessment: Pharmacy consulted to dose apixaban for this patient for paroxysmal a fib RVR.   EKG on 4/27 concerning for A fib, telemetry showed sinus rhythm with frequent PVC/PACs, and intermittent SVT episodes up to 160bpm that are self limiting. CHA2DS2-VASc Score = 4. Age greater than 80 years and weight less than 60 kg   Goal of Therapy:  Reduce the risk of stroke and systemic  embolism Monitor platelets by anticoagulation protocol: Yes   Plan:  Discontinue enoxaparin  Apixaban 2.5 mg po BID With no dose adjustments anticipated, Pharmacy will sign off on consult and continue to monitor CBC as ordered by provider along with signs and symptoms of bleeding.   Thank you for allowing pharmacy to be a part of this patient's care.  Priscilla Cantrell, PharmD, BCPS Clinical Pharmacist [place] 06/01/2023 2:22 PM   Priscilla Cantrell 06/01/2023,2:11 PM

## 2023-06-01 NOTE — Progress Notes (Signed)
 OT Cancellation Note  Patient Details Name: Priscilla Cantrell MRN: [id number] DOB: 24-Sep-1935   Cancelled Treatment:    Reason Eval/Treat Not Completed: Medical issues which prohibited therapy Checked on patient this AM and PM with patient still on BiPAP at this time. OT to continue to follow and check back on 4/29.  Wynette Heckler, MS Acute Rehabilitation Department Office# [phone]  06/01/2023, 2:05 PM

## 2023-06-01 NOTE — Plan of Care (Signed)

## 2023-06-01 NOTE — Progress Notes (Signed)
 Pt placed on HHFNC per MD order.  Pt placed on 35L and 90%.  Pt initially was tolerating well with sats 91% and resting comfortably.  Pt had a coughing episode and desated into low 70s. RN placed pt back on bipap due to her desaturation and SOB.  Pt weaned down to 90% on bipap now and 12/6, R 15 (which she had previously been on).

## 2023-06-01 NOTE — Progress Notes (Signed)
 PT Cancellation Note  Patient Details Name: Priscilla Cantrell MRN: [id number] DOB: 07/26/1935   Cancelled Treatment:    Reason Eval/Treat Not Completed: Medical issues which prohibited therapy (per RN, pt is on BiPAP and is not able to tolerate activity at present. Will follow.)   Daymon Evans PT 06/01/2023  Acute Rehabilitation Services  Office [phone]

## 2023-06-01 NOTE — Discharge Instructions (Signed)

## 2023-06-01 NOTE — Plan of Care (Signed)
 ?  Problem: Coping: ?Goal: Level of anxiety will decrease ?Outcome: Progressing ?  ?Problem: Safety: ?Goal: Ability to remain free from injury will improve ?Outcome: Progressing ?  ?

## 2023-06-02 ENCOUNTER — Inpatient Hospital Stay (HOSPITAL_COMMUNITY)

## 2023-06-02 ENCOUNTER — Other Ambulatory Visit (HOSPITAL_COMMUNITY)

## 2023-06-02 DIAGNOSIS — I5031 Acute diastolic (congestive) heart failure: Secondary | ICD-10-CM

## 2023-06-02 DIAGNOSIS — J441 Chronic obstructive pulmonary disease with (acute) exacerbation: Principal | ICD-10-CM

## 2023-06-02 DIAGNOSIS — I4891 Unspecified atrial fibrillation: Secondary | ICD-10-CM | POA: Diagnosis not present

## 2023-06-02 DIAGNOSIS — I471 Supraventricular tachycardia, unspecified: Secondary | ICD-10-CM | POA: Diagnosis not present

## 2023-06-02 DIAGNOSIS — J189 Pneumonia, unspecified organism: Secondary | ICD-10-CM | POA: Diagnosis not present

## 2023-06-02 DIAGNOSIS — J9621 Acute and chronic respiratory failure with hypoxia: Secondary | ICD-10-CM | POA: Diagnosis not present

## 2023-06-02 DIAGNOSIS — A419 Sepsis, unspecified organism: Secondary | ICD-10-CM | POA: Diagnosis not present

## 2023-06-02 LAB — ECHOCARDIOGRAM COMPLETE
AR max vel: 1.87 cm2
AV Area VTI: 2.12 cm2
AV Area mean vel: 1.82 cm2
AV Mean grad: 4 mmHg
AV Peak grad: 8.6 mmHg
Ao pk vel: 1.47 m/s
Area-P 1/2: 3.81 cm2
Calc EF: 67.4 %
Height: 62 in
S' Lateral: 2.9 cm
Single Plane A2C EF: 60.6 %
Single Plane A4C EF: 73 %
Weight: 1950.63 [oz_av]

## 2023-06-02 LAB — BASIC METABOLIC PANEL WITH GFR
Anion gap: 9 (ref 5–15)
BUN: 38 mg/dL — ABNORMAL HIGH (ref 8–23)
CO2: 32 mmol/L (ref 22–32)
Calcium: 8.7 mg/dL — ABNORMAL LOW (ref 8.9–10.3)
Chloride: 101 mmol/L (ref 98–111)
Creatinine, Ser: 0.83 mg/dL (ref 0.44–1.00)
GFR, Estimated: >60 mL/min (ref >60)
Glucose, Bld: 160 mg/dL — ABNORMAL HIGH (ref 70–99)
Potassium: 3.5 mmol/L (ref 3.5–5.1)
Sodium: 142 mmol/L (ref 135–145)

## 2023-06-02 MED ORDER — POTASSIUM CHLORIDE CRYS ER 20 MEQ PO TBCR
40.0000 meq | EXTENDED_RELEASE_TABLET | Freq: Once | ORAL | Status: AC
  Administered 2023-06-02: 40 meq via ORAL
  Filled 2023-06-02: qty 2

## 2023-06-02 MED ORDER — DILTIAZEM HCL ER COATED BEADS 180 MG PO CP24
180.0000 mg | ORAL_CAPSULE | Freq: Every day | ORAL | Status: DC
  Administered 2023-06-03 – 2023-06-06 (×4): 180 mg via ORAL
  Filled 2023-06-02 (×4): qty 1

## 2023-06-02 NOTE — Progress Notes (Signed)
  Echocardiogram 2D Echocardiogram has been performed.  Annis Kinder, RDCS 06/02/2023, 3:25 PM

## 2023-06-02 NOTE — Progress Notes (Signed)
 [facility]  PROGRESS NOTE  Priscilla Cantrell UJW:[id number] DOB: 09-27-1935 DOA: 05/30/2023 PCP: Shelvia Dick, MD   Brief HPI:    88 y.o. female with medical history significant for CKD stage II, depression, chronic hypoxic respiratory failure due to COPD on 2 L at baseline being admitted to the hospital with sepsis due to community-acquired pneumonia.  History is provided by the patient, as well as her niece who is at the bedside.  She has been coughing up green sputum for the last couple of weeks, feeling more short of breath with exertion.  This morning at home she was noted to be saturating about 70% on 2 L nasal cannula workup in the ER as noted below showed evidence of community-acquired pneumonia, she was started on empiric IV antibiotics    Assessment/Plan:   Sepsis due to community-acquired pneumonia -Sepsis physiology has resolved - Presented with fever, tachycardia, tachypnea, leukocytosis - Source of infection likely community-acquired pneumonia -Procalcitonin 2.70 - Started on IV doxycycline ; doxycycline  discontinued -Patient started on IV Levaquin  500 mg daily    Acute on chronic hypoxic respiratory failure-due to community-acquired pneumonia superimposed on COPD/CHF - Continue Solu-Medrol  40 mg IV twice daily -Xopenex  nebulizer for 6 hours, will add ipratropium nebulizer every 6 hours - Given Mucomyst nebulizer treatment for excessive secretions - Continue BiPAP  Acute on chronic diastolic CHF -Echocardiogram from 5/23 showed EF of 65% with grade 1 diastolic dysfunction -Her BNP obtained is elevated at 838 -Getting IV Lasix  40 mg daily with good diuresis -Cardiology consulted for further recommendations   Intake/Output Summary (Last 24 hours) at 06/02/2023 0807 Last data filed at 06/02/2023 0600 Gross per 24 hour  Intake 552.59 ml  Output 1700 ml  Net -1147.41 ml     Hypertension -Amlodipine  on hold due to low blood pressure    CKD 2 -renal  function appears to be at baseline -Started on Lasix  as above - Follow BMP in am   Hypothyroidism -Synthroid    GERD -Protonix   Leukocytosis - Likely in setting of steroids   Medications     acetylcysteine  3 mL Nebulization BID   apixaban  2.5 mg Oral BID   diltiazem  120 mg Oral Daily   fluticasone   2 spray Each Nare Daily   furosemide   40 mg Intravenous Daily   guaiFENesin   1,200 mg Oral BID   ipratropium  0.5 mg Nebulization Q6H   levalbuterol   0.63 mg Nebulization Q6H   levothyroxine   75 mcg Oral Q0600   methylPREDNISolone  (SOLU-MEDROL ) injection  40 mg Intravenous Q12H   metoprolol succinate  25 mg Oral Daily   pantoprazole   40 mg Oral BID   polyethylene glycol  17 g Oral Daily   senna-docusate  2 tablet Oral BID   venlafaxine   50 mg Oral BID     Data Reviewed:   CBG:  No results for input(s): "GLUCAP" in the last 168 hours.  SpO2: 91 % O2 Flow Rate (L/min): (S) 35 L/min FiO2 (%): 80 %    Vitals:   06/01/23 2118 06/02/23 0003 06/02/23 0355 06/02/23 0638  BP: (!) 120/59   (!) 147/80  Pulse: 77 70 69 82  Resp: 20 (!) 21 20 20   Temp: 97.8 F (36.6 C)   (!) 97.5 F (36.4 C)  TempSrc:    Oral  SpO2: 93% 93% 93% 91%  Weight:      Height:          Data Reviewed:  Basic Metabolic Panel: Recent  Labs  Lab 05/30/23 0042 05/31/23 0411 05/31/23 1650 06/01/23 0837 06/02/23 0348  NA 136 136 139 142 142  K 3.7 3.8 4.2 3.8 3.5  CL 99 103 106 101 101  CO2 24 24 24 29  32  GLUCOSE 153* 112* 125* 142* 160*  BUN 24* 28* 26* 31* 38*  CREATININE 0.94 0.70 0.59 0.71 0.83  CALCIUM  8.5* 8.5* 8.4* 8.8* 8.7*    CBC: Recent Labs  Lab 05/30/23 0042 05/31/23 0411 06/01/23 0337  WBC 18.8* 19.2* 18.4*  NEUTROABS 16.8*  --   --   HGB 11.2* 10.8* 11.2*  HCT 34.2* 34.3* 36.1  MCV 91.2 92.0 92.3  PLT 269 249 240    LFT Recent Labs  Lab 05/30/23 0042 05/31/23 1650  AST 19 16  ALT 21 24  ALKPHOS 66 56  BILITOT 1.0 0.8  PROT 6.1* 5.1*  ALBUMIN 3.1*  2.3*     Antibiotics: Anti-infectives (From admission, onward)    Start     Dose/Rate Route Frequency Ordered Stop   05/31/23 1730  levofloxacin  (LEVAQUIN ) IVPB 500 mg        500 mg 100 mL/hr over 60 Minutes Intravenous Every 24 hours 05/31/23 1631     05/30/23 0900  doxycycline  (VIBRAMYCIN ) 100 mg in sodium chloride  0.9 % 250 mL IVPB  Status:  Discontinued        100 mg 125 mL/hr over 120 Minutes Intravenous Every 12 hours 05/30/23 0848 05/31/23 1631   05/30/23 0045  aztreonam (AZACTAM) 2 g in sodium chloride  0.9 % 100 mL IVPB        2 g 200 mL/hr over 30 Minutes Intravenous  Once 05/30/23 0037 05/30/23 0323   05/30/23 0045  metroNIDAZOLE  (FLAGYL ) IVPB 500 mg        500 mg 100 mL/hr over 60 Minutes Intravenous  Once 05/30/23 0037 05/30/23 0323   05/30/23 0045  vancomycin  (VANCOCIN ) IVPB 1000 mg/200 mL premix        1,000 mg 200 mL/hr over 60 Minutes Intravenous  Once 05/30/23 0037 05/30/23 0323        DVT prophylaxis: Lovenox   Code Status: Full code  Family Communication:    CONSULTS    Subjective   Continues to require BiPAP.  Feels thirsty this morning   Objective    Physical Examination:   Appears in no acute distress, on BiPAP S1-S2, regular Lungs bibasilar crackles Abdomen is soft, nontender, no organomegaly   Status is: Inpatient:             Priscilla Cantrell   [facility] If 7PM-7AM, please contact night-coverage at [email or web address], Office  [phone]   06/02/2023, 8:07 AM  LOS: 3 days

## 2023-06-02 NOTE — Progress Notes (Signed)
 Pt taken off bipap and placed on HHFNC 35L/90%.  Pt is tolerating well. HR 73, RR 22, sats 90-92%.

## 2023-06-02 NOTE — Plan of Care (Signed)

## 2023-06-02 NOTE — Progress Notes (Signed)
 Pt taken off bipap and placed on HHFNC.  PT is on 35L and 90% and tolerating well.  HR74, RR 22, sats 90-92%.  RN updated.

## 2023-06-02 NOTE — Progress Notes (Signed)
 OT Cancellation Note  Patient Details Name: Priscilla Cantrell MRN: [id number] DOB: September 22, 1935   Cancelled Treatment:    Reason Eval/Treat Not Completed: Patient at procedure or test/ unavailable Patient having echo done. OT to continue to follow and check back as schedule will allow. Wynette Heckler, MS Acute Rehabilitation Department Office# [phone]   06/02/2023, 3:40 PM

## 2023-06-02 NOTE — Progress Notes (Signed)
 PT Cancellation Note  Patient Details Name: Priscilla Cantrell MRN: [id number] DOB: 11/24/1935   Cancelled Treatment:    Reason Eval/Treat Not Completed: Patient at procedure or test/unavailable. ECHO in room upon arrival. Will check back for PT eval as schedule permits.    Judyann Number PT, DPT 06/02/23, 3:32 PM

## 2023-06-02 NOTE — Progress Notes (Addendum)
 Progress Note  Patient Name: Priscilla Cantrell Date of Encounter: 06/02/2023  [facility] Cardiologist: None   Patient Profile     Subjective   Now weaned off BiPAP and O2 sats 96% on 35% high flow nasal cannula.  Remains congested and coughing  a lot  Inpatient Medications    Scheduled Meds:  apixaban  2.5 mg Oral BID   diltiazem  120 mg Oral Daily   fluticasone   2 spray Each Nare Daily   furosemide   40 mg Intravenous Daily   guaiFENesin   1,200 mg Oral BID   ipratropium  0.5 mg Nebulization Q6H   levalbuterol   0.63 mg Nebulization Q6H   levothyroxine   75 mcg Oral Q0600   methylPREDNISolone  (SOLU-MEDROL ) injection  40 mg Intravenous Q12H   metoprolol succinate  25 mg Oral Daily   pantoprazole   40 mg Oral BID   polyethylene glycol  17 g Oral Daily   senna-docusate  2 tablet Oral BID   venlafaxine   50 mg Oral BID   Continuous Infusions:  levofloxacin  (LEVAQUIN ) IV 500 mg (06/01/23 1718)   PRN Meds: acetaminophen  **OR** acetaminophen , ondansetron  **OR** ondansetron  (ZOFRAN ) IV   Vital Signs    Vitals:   06/02/23 0638 06/02/23 0825 06/02/23 0828 06/02/23 1222  BP: (!) 147/80   129/77  Pulse: 82  71 77  Resp: 20  (!) 23 20  Temp: (!) 97.5 F (36.4 C)   (!) 96.8 F (36 C)  TempSrc: Oral   Axillary  SpO2: 91% 94% 93% 96%  Weight:      Height:        Intake/Output Summary (Last 24 hours) at 06/02/2023 1313 Last data filed at 06/02/2023 0600 Gross per 24 hour  Intake 552.59 ml  Output 1700 ml  Net -1147.41 ml      05/30/2023   10:49 AM 05/28/2023   11:10 AM 05/21/2023    4:26 PM  Last 3 Weights  Weight (lbs) 121 lb 14.6 oz 117 lb 12.8 oz 119 lb 0.8 oz  Weight (kg) 55.3 kg 53.434 kg 54 kg      Telemetry    Normal sinus rhythm with a short burst of nonsustained atrial tachycardia- Personally Reviewed  ECG    No new EKG to review- Personally Reviewed  Physical Exam   GEN: Mild respiratory distress Neck: No JVD Cardiac: RRR, no murmurs, rubs,  or gallops.  Respiratory: Decreased sounds at the bases with wheezing GI: Soft, nontender, non-distended  MS: No edema; No deformity. Neuro:  Nonfocal  Psych: Normal affect   Labs    High Sensitivity Troponin:  No results for input(s): "TROPONINIHS" in the last 720 hours.    Chemistry Recent Labs  Lab 05/30/23 0042 05/31/23 0411 05/31/23 1650 06/01/23 0837 06/02/23 0348  NA 136   < > 139 142 142  K 3.7   < > 4.2 3.8 3.5  CL 99   < > 106 101 101  CO2 24   < > 24 29 32  GLUCOSE 153*   < > 125* 142* 160*  BUN 24*   < > 26* 31* 38*  CREATININE 0.94   < > 0.59 0.71 0.83  CALCIUM  8.5*   < > 8.4* 8.8* 8.7*  PROT 6.1*  --  5.1*  --   --   ALBUMIN 3.1*  --  2.3*  --   --   AST 19  --  16  --   --   ALT 21  --  24  --   --   ALKPHOS 66  --  56  --   --   BILITOT 1.0  --  0.8  --   --   GFRNONAA 59*   < > >60 >60 >60  ANIONGAP 13   < > 9 12 9    < > = values in this interval not displayed.     Hematology Recent Labs  Lab 05/30/23 0042 05/31/23 0411 06/01/23 0337  WBC 18.8* 19.2* 18.4*  RBC 3.75* 3.73* 3.91  HGB 11.2* 10.8* 11.2*  HCT 34.2* 34.3* 36.1  MCV 91.2 92.0 92.3  MCH 29.9 29.0 28.6  MCHC 32.7 31.5 31.0  RDW 13.1 13.1 13.0  PLT 269 249 240    BNP Recent Labs  Lab 05/31/23 1026  BNP 834.8*     DDimer No results for input(s): "DDIMER" in the last 168 hours.   CHA2DS2-VASc Score = 4   This indicates a 4.8% annual risk of stroke. The patient's score is based upon: CHF History: 0 HTN History: 1 Diabetes History: 0 Stroke History: 0 Vascular Disease History: 0 Age Score: 2 Gender Score: 1   Radiology    No results found.  Patient Profile     88 y.o. female with a hx of oxygen  dependent COPD with 2LNC, hypothyroidism, HTN, depression,  who is being seen 06/01/2023 for the evaluation of CHF at the request of Dr Alfonse Angle.   Assessment & Plan    Acute on Chronic Hypoxemic Respiratory Failure COPD exacerbation/CAP ?Acute diastolic CHF Symptoms most  consistent with COPD exacerbation due to CAP (patient had fever of 102 with bilateral lower lobe airspace opacities and leukocytosis) Currently on IV antibiotics, Mucomyst, Xopenex  and Atrovent  nebs as well as IV Solu-Medrol  Currently on Lasix  40mg  IV BID weight not recorded She put out 1.7 L yesterday and is net -1.1 L since admission SCr stable at 0.83 and potassium 3.5 2D echo pending Suspect she does have a component of acute diastolic CHF likely triggered by her COPD exacerbation and pneumonia Now off BiPAP and on high flow nasal cannula Repeat BNP and chest x-ray Continue IV diuretics for now Strict I's and O's, daily weights and renal function while diuresing Replete potassium to keep >4 Check mag level   New onset atrial fibrillation with RVR Paroxysmal SVT She has no history of atrial fibrillation in the past normal sinus rhythm on admission but EKG 4/27 showed atrial fibrillation Telemetry today shows normal sinus rhythm with frequent PVCs and short bursts of SVT that are consistent with nonsustained atrial tachycardia TSH minimally reduced at 0.313>> check FT4 CHA2DS2-VASc score is 7  (hypertension, CHF, hypertension, CAD, age> 13, female) She is at significant increased risk for cardioembolic events and has been started on Eliquis 2.5 mg twice daily (dose reduced due to age >15 and weight <60 kg) Continue Toprol XL 25 mg daily Increase Cardizem CD to 180 mg daily to try to suppress atrial arrhythmias    I spent 35 minutes caring for this patient today face to face, ordering and reviewing labs, reviewing records from 2D echo  2023, Cxray this admit, seeing the patient, documenting in the record and arranging for repeat echo and chest xray  For questions or updates, please contact [facility] Please consult [email or web address] for contact info under        Signed, Gaylyn Keas, MD  06/02/2023, 1:13 PM

## 2023-06-03 ENCOUNTER — Other Ambulatory Visit (HOSPITAL_COMMUNITY): Payer: Self-pay

## 2023-06-03 ENCOUNTER — Inpatient Hospital Stay (HOSPITAL_COMMUNITY)

## 2023-06-03 ENCOUNTER — Telehealth (HOSPITAL_COMMUNITY): Payer: Self-pay | Admitting: Pharmacy Technician

## 2023-06-03 DIAGNOSIS — I4891 Unspecified atrial fibrillation: Secondary | ICD-10-CM | POA: Diagnosis not present

## 2023-06-03 DIAGNOSIS — J9621 Acute and chronic respiratory failure with hypoxia: Secondary | ICD-10-CM

## 2023-06-03 DIAGNOSIS — I5031 Acute diastolic (congestive) heart failure: Secondary | ICD-10-CM | POA: Diagnosis not present

## 2023-06-03 DIAGNOSIS — I471 Supraventricular tachycardia, unspecified: Secondary | ICD-10-CM | POA: Diagnosis not present

## 2023-06-03 DIAGNOSIS — J189 Pneumonia, unspecified organism: Secondary | ICD-10-CM | POA: Diagnosis not present

## 2023-06-03 DIAGNOSIS — A419 Sepsis, unspecified organism: Secondary | ICD-10-CM | POA: Diagnosis not present

## 2023-06-03 LAB — BASIC METABOLIC PANEL WITH GFR
Anion gap: 11 (ref 5–15)
BUN: 39 mg/dL — ABNORMAL HIGH (ref 8–23)
CO2: 29 mmol/L (ref 22–32)
Calcium: 8.8 mg/dL — ABNORMAL LOW (ref 8.9–10.3)
Chloride: 99 mmol/L (ref 98–111)
Creatinine, Ser: 0.8 mg/dL (ref 0.44–1.00)
GFR, Estimated: >60 mL/min (ref >60)
Glucose, Bld: 153 mg/dL — ABNORMAL HIGH (ref 70–99)
Potassium: 3.9 mmol/L (ref 3.5–5.1)
Sodium: 139 mmol/L (ref 135–145)

## 2023-06-03 LAB — BRAIN NATRIURETIC PEPTIDE: B Natriuretic Peptide: 433.8 pg/mL — ABNORMAL HIGH (ref 0.0–100.0)

## 2023-06-03 LAB — MAGNESIUM: Magnesium: 1.9 mg/dL (ref 1.7–2.4)

## 2023-06-03 MED ORDER — SODIUM CHLORIDE 3 % IN NEBU
4.0000 mL | INHALATION_SOLUTION | Freq: Two times a day (BID) | RESPIRATORY_TRACT | Status: AC
  Administered 2023-06-03 – 2023-06-06 (×6): 4 mL via RESPIRATORY_TRACT
  Filled 2023-06-03 (×6): qty 4

## 2023-06-03 MED ORDER — REVEFENACIN 175 MCG/3ML IN SOLN
175.0000 ug | Freq: Every day | RESPIRATORY_TRACT | Status: DC
  Administered 2023-06-03 – 2023-06-17 (×15): 175 ug via RESPIRATORY_TRACT
  Filled 2023-06-03 (×15): qty 3

## 2023-06-03 MED ORDER — BUDESONIDE 0.5 MG/2ML IN SUSP
0.5000 mg | Freq: Two times a day (BID) | RESPIRATORY_TRACT | Status: DC
  Administered 2023-06-03 – 2023-06-17 (×29): 0.5 mg via RESPIRATORY_TRACT
  Filled 2023-06-03 (×30): qty 2

## 2023-06-03 MED ORDER — MAGNESIUM SULFATE 2 GM/50ML IV SOLN
2.0000 g | Freq: Once | INTRAVENOUS | Status: AC
  Administered 2023-06-03: 2 g via INTRAVENOUS
  Filled 2023-06-03: qty 50

## 2023-06-03 MED ORDER — ARFORMOTEROL TARTRATE 15 MCG/2ML IN NEBU
15.0000 ug | INHALATION_SOLUTION | Freq: Two times a day (BID) | RESPIRATORY_TRACT | Status: DC
  Administered 2023-06-03 – 2023-06-17 (×28): 15 ug via RESPIRATORY_TRACT
  Filled 2023-06-03 (×29): qty 2

## 2023-06-03 MED ORDER — LEVALBUTEROL HCL 0.63 MG/3ML IN NEBU
0.6300 mg | INHALATION_SOLUTION | Freq: Four times a day (QID) | RESPIRATORY_TRACT | Status: DC | PRN
Start: 2023-06-03 — End: 2023-06-17

## 2023-06-03 MED ORDER — METHYLPREDNISOLONE SODIUM SUCC 125 MG IJ SOLR
80.0000 mg | Freq: Two times a day (BID) | INTRAMUSCULAR | Status: DC
  Administered 2023-06-03 – 2023-06-08 (×12): 80 mg via INTRAVENOUS
  Filled 2023-06-03 (×12): qty 2

## 2023-06-03 NOTE — Progress Notes (Addendum)
 Progress Note  Patient Name: Priscilla Cantrell Date of Encounter: 06/03/2023  [facility] Cardiologist: None   Patient Profile     Subjective   on BiPAP overnight but now back on HHFNC 35 L with O2 sats 89 to 93%.  Still SOB.  Rhythm stable on tele with no further SVT/PAF  Inpatient Medications    Scheduled Meds:  apixaban  2.5 mg Oral BID   budesonide  (PULMICORT ) nebulizer solution  0.5 mg Nebulization BID   diltiazem  180 mg Oral Daily   fluticasone   2 spray Each Nare Daily   furosemide   40 mg Intravenous Daily   guaiFENesin   1,200 mg Oral BID   ipratropium  0.5 mg Nebulization Q6H   levalbuterol   0.63 mg Nebulization Q6H   levothyroxine   75 mcg Oral Q0600   methylPREDNISolone  (SOLU-MEDROL ) injection  80 mg Intravenous Q12H   metoprolol succinate  25 mg Oral Daily   pantoprazole   40 mg Oral BID   polyethylene glycol  17 g Oral Daily   senna-docusate  2 tablet Oral BID   venlafaxine   50 mg Oral BID   Continuous Infusions:  levofloxacin  (LEVAQUIN ) IV 500 mg (06/02/23 1815)   PRN Meds: acetaminophen  **OR** acetaminophen , ondansetron  **OR** ondansetron  (ZOFRAN ) IV   Vital Signs    Vitals:   06/03/23 0545 06/03/23 0834 06/03/23 0837 06/03/23 1017  BP: (!) 160/78     Pulse: 69     Resp: 16     Temp: 97.8 F (36.6 C)     TempSrc:      SpO2: 97% 93% 93% (!) 89%  Weight:      Height:        Intake/Output Summary (Last 24 hours) at 06/03/2023 1033 Last data filed at 06/03/2023 0600 Gross per 24 hour  Intake 340 ml  Output 1200 ml  Net -860 ml      06/03/2023    5:00 AM 05/30/2023   10:49 AM 05/28/2023   11:10 AM  Last 3 Weights  Weight (lbs) 117 lb 1 oz 121 lb 14.6 oz 117 lb 12.8 oz  Weight (kg) 53.1 kg 55.3 kg 53.434 kg      Telemetry    Normal sinus rhythm  personally Reviewed  ECG    No new EKG to review- Personally Reviewed  Physical Exam   GEN: Well nourished, well developed in no acute distress HEENT: Normal NECK: No JVD; No carotid  bruits LYMPHATICS: No lymphadenopathy CARDIAC:RRR, no murmurs, rubs, gallops RESPIRATORY coarse rhonchi with cough and wheezes ABDOMEN: Soft, non-tender, non-distended MUSCULOSKELETAL:  No edema; No deformity  SKIN: Warm and dry NEUROLOGIC:  Alert and oriented x 3 PSYCHIATRIC:  Normal affect  Labs    High Sensitivity Troponin:  No results for input(s): "TROPONINIHS" in the last 720 hours.    Chemistry Recent Labs  Lab 05/30/23 0042 05/31/23 0411 05/31/23 1650 06/01/23 0837 06/02/23 0348 06/03/23 0416  NA 136   < > 139 142 142 139  K 3.7   < > 4.2 3.8 3.5 3.9  CL 99   < > 106 101 101 99  CO2 24   < > 24 29 32 29  GLUCOSE 153*   < > 125* 142* 160* 153*  BUN 24*   < > 26* 31* 38* 39*  CREATININE 0.94   < > 0.59 0.71 0.83 0.80  CALCIUM  8.5*   < > 8.4* 8.8* 8.7* 8.8*  PROT 6.1*  --  5.1*  --   --   --  ALBUMIN 3.1*  --  2.3*  --   --   --   AST 19  --  16  --   --   --   ALT 21  --  24  --   --   --   ALKPHOS 66  --  56  --   --   --   BILITOT 1.0  --  0.8  --   --   --   GFRNONAA 59*   < > >60 >60 >60 >60  ANIONGAP 13   < > 9 12 9 11    < > = values in this interval not displayed.     Hematology Recent Labs  Lab 05/30/23 0042 05/31/23 0411 06/01/23 0337  WBC 18.8* 19.2* 18.4*  RBC 3.75* 3.73* 3.91  HGB 11.2* 10.8* 11.2*  HCT 34.2* 34.3* 36.1  MCV 91.2 92.0 92.3  MCH 29.9 29.0 28.6  MCHC 32.7 31.5 31.0  RDW 13.1 13.1 13.0  PLT 269 249 240    BNP Recent Labs  Lab 05/31/23 1026 06/03/23 0416  BNP 834.8* 433.8*     DDimer No results for input(s): "DDIMER" in the last 168 hours.   CHA2DS2-VASc Score = 4   This indicates a 4.8% annual risk of stroke. The patient's score is based upon: CHF History: 0 HTN History: 1 Diabetes History: 0 Stroke History: 0 Vascular Disease History: 0 Age Score: 2 Gender Score: 1   Radiology    ECHOCARDIOGRAM COMPLETE Result Date: 06/02/2023    ECHOCARDIOGRAM REPORT   Patient Name:   Priscilla Cantrell Date of Exam:  06/02/2023 Medical Rec #:  [id number]        Height:       62.0 in Accession #:    [id number]       Weight:       121.9 lb Date of Birth:  12/31/1935         BSA:          1.549 m Patient Age:    88 years         BP:           147/80 mmHg Patient Gender: F                HR:           76 bpm. Exam Location:  Inpatient Procedure: 2D Echo, Color Doppler and Cardiac Doppler (Both Spectral and Color            Flow Doppler were utilized during procedure). Indications:    I50.31 Acute diastolic (congestive) heart failure  History:        Patient has prior history of Echocardiogram examinations, most                 recent 06/21/2021. Signs/Symptoms:Shortness of Breath; Risk                 Factors:Hypertension.  Sonographer:    Andrena Bang Referring Phys: [phone] Carlette Palmatier R Shala Baumbach  Sonographer Comments: Pt sitting up. SOB and coughing throughout exam. IMPRESSIONS  1. Left ventricular ejection fraction, by estimation, is 60 to 65%. The left ventricle has normal function. The left ventricle has no regional wall motion abnormalities. Left ventricular diastolic parameters are consistent with Grade I diastolic dysfunction (impaired relaxation). Elevated left atrial pressure.  2. Right ventricular systolic function is normal. The right ventricular size is moderately enlarged. There is severely elevated pulmonary artery systolic pressure.  3. Right atrial size was  moderately dilated.  4. The mitral valve is normal in structure. No evidence of mitral valve regurgitation.  5. Tricuspid valve regurgitation is moderate to severe.  6. The aortic valve is tricuspid. Aortic valve regurgitation is not visualized. No aortic stenosis is present.  7. There is borderline dilatation of the ascending aorta, measuring 38 mm.  8. The inferior vena cava is dilated in size with <50% respiratory variability, suggesting right atrial pressure of 15 mmHg. Comparison(s): Prior images reviewed side by side. There is interval dilation of the right ventricle,  worsened tricuspid insufficiency and higher systolic pulmonary artery pressure. FINDINGS  Left Ventricle: Left ventricular ejection fraction, by estimation, is 60 to 65%. The left ventricle has normal function. The left ventricle has no regional wall motion abnormalities. The left ventricular internal cavity size was normal in size. There is  no left ventricular hypertrophy. Left ventricular diastolic parameters are consistent with Grade I diastolic dysfunction (impaired relaxation). Elevated left atrial pressure. Right Ventricle: The right ventricular size is moderately enlarged. Right vetricular wall thickness was not well visualized. Right ventricular systolic function is normal. There is severely elevated pulmonary artery systolic pressure. The tricuspid regurgitant velocity is 3.49 m/s, and with an assumed right atrial pressure of 15 mmHg, the estimated right ventricular systolic pressure is 63.7 mmHg. Left Atrium: Left atrial size was normal in size. Right Atrium: Right atrial size was moderately dilated. Pericardium: There is no evidence of pericardial effusion. Mitral Valve: The mitral valve is normal in structure. No evidence of mitral valve regurgitation. Tricuspid Valve: The tricuspid valve is normal in structure. Tricuspid valve regurgitation is moderate to severe. Aortic Valve: The aortic valve is tricuspid. Aortic valve regurgitation is not visualized. No aortic stenosis is present. Aortic valve mean gradient measures 4.0 mmHg. Aortic valve peak gradient measures 8.6 mmHg. Aortic valve area, by VTI measures 2.12 cm. Pulmonic Valve: The pulmonic valve was grossly normal. Pulmonic valve regurgitation is trivial. No evidence of pulmonic stenosis. Aorta: The aortic root is normal in size and structure. There is borderline dilatation of the ascending aorta, measuring 38 mm. Venous: The inferior vena cava is dilated in size with less than 50% respiratory variability, suggesting right atrial pressure of 15  mmHg. IAS/Shunts: The interatrial septum was not well visualized.  LEFT VENTRICLE PLAX 2D LVIDd:         4.30 cm     Diastology LVIDs:         2.90 cm     LV e' medial:    3.70 cm/s LV PW:         0.90 cm     LV E/e' medial:  22.3 LV IVS:        0.70 cm     LV e' lateral:   8.49 cm/s LVOT diam:     2.00 cm     LV E/e' lateral: 9.7 LV SV:         62 LV SV Index:   40 LVOT Area:     3.14 cm  LV Volumes (MOD) LV vol d, MOD A2C: 55.8 ml LV vol d, MOD A4C: 72.3 ml LV vol s, MOD A2C: 22.0 ml LV vol s, MOD A4C: 19.5 ml LV SV MOD A2C:     33.8 ml LV SV MOD A4C:     72.3 ml LV SV MOD BP:      42.9 ml LEFT ATRIUM             Index LA diam:  3.80 cm 2.45 cm/m LA Vol (A2C):   40.3 ml 26.02 ml/m LA Vol (A4C):   22.7 ml 14.65 ml/m LA Biplane Vol: 30.6 ml 19.75 ml/m  AORTIC VALVE AV Area (Vmax):    1.87 cm AV Area (Vmean):   1.82 cm AV Area (VTI):     2.12 cm AV Vmax:           147.00 cm/s AV Vmean:          85.900 cm/s AV VTI:            0.290 m AV Peak Grad:      8.6 mmHg AV Mean Grad:      4.0 mmHg LVOT Vmax:         87.70 cm/s LVOT Vmean:        49.700 cm/s LVOT VTI:          0.196 m LVOT/AV VTI ratio: 0.68  AORTA Ao Asc diam: 3.80 cm MITRAL VALVE               TRICUSPID VALVE MV Area (PHT): 3.81 cm    TR Peak grad:   48.7 mmHg MV Decel Time: 199 msec    TR Vmax:        349.00 cm/s MV E velocity: 82.60 cm/s MV A velocity: 93.60 cm/s  SHUNTS MV E/A ratio:  0.88        Systemic VTI:  0.20 m                            Systemic Diam: 2.00 cm Karyl Paget Croitoru MD Electronically signed by Luana Rumple MD Signature Date/Time: 06/02/2023/4:00:03 PM    Final     Patient Profile     88 y.o. female with a hx of oxygen  dependent COPD with 2LNC, hypothyroidism, HTN, depression,  who is being seen 06/01/2023 for the evaluation of CHF at the request of Dr Alfonse Angle.   Assessment & Plan    Acute on Chronic Hypoxemic Respiratory Failure COPD exacerbation/CAP ?Acute diastolic CHF Symptoms most consistent with COPD  exacerbation due to CAP (patient had fever of 102 with bilateral lower lobe airspace opacities and leukocytosis) Currently on IV antibiotics, Mucomyst, Xopenex  and Atrovent  nebs as well as IV Solu-Medrol  Currently on Lasix  40mg  IV BID weight down 5lbs from admission She put out 1.2 L yesterday and is net -2 L since admission SCr stable at 0.8 and potassium 3.9 2D echo EF 60 to 65% with G1 DD and elevated LAP, moderate RV enlargement with normal RV systolic function, moderate RAE, moderate to severe TR and RAP estimated at 15 mmHg Suspect she does have a component of acute diastolic CHF likely triggered by her COPD exacerbation and pneumonia On BiPAP at night but currently on HFNC at 35 L with O2 sats 89 to 93% BNP has decreased from 834->>433 Chest x-ray pending With echo showing RAP estimated at 15 mmHg I think she is still volume overloaded Continue Lasix  40 mg IV twice daily Strict I's and O's, daily weights and renal function while diuresing Replete potassium to keep >4 Replete mag to keep >2   New onset atrial fibrillation with RVR Paroxysmal SVT She has no history of atrial fibrillation in the past normal sinus rhythm on admission but EKG 4/27 showed atrial fibrillation Telemetry has shown normal sinus rhythm with frequent PVCs and short bursts of SVT that are consistent with nonsustained atrial tachycardia.  With improvement in O2 saturations arrhythmias  have resolved and now normal sinus rhythm on telemetry TSH minimally reduced at 0.313>> T4 significantly elevated at 2.15 which may be driving her A-fib [id number] score is 7  (hypertension, CHF, hypertension, CAD, age> 17, female) She is at significant increased risk for cardioembolic events and has been started on Eliquis 2.5 mg twice daily (dose reduced due to age >7 and weight <60 kg) Continue Cardizem CD 180 mg daily and Toprol XL 25 mg daily for suppression of SVT    I spent 35 minutes caring for this patient today face to  face, ordering and reviewing labs, reviewing records from 2D echo  2023, Cxray this admit, seeing the patient, documenting in the record and arranging for repeat echo and chest xray  For questions or updates, please contact [facility] Please consult [email or web address] for contact info under        Signed, Gaylyn Keas, MD  06/03/2023, 10:33 AM

## 2023-06-03 NOTE — Progress Notes (Signed)
   06/03/23 0032  BiPAP/CPAP/SIPAP  $ Non-Invasive Ventilator  Non-Invasive Vent Subsequent  BiPAP/CPAP/SIPAP Pt Type Adult  BiPAP/CPAP/SIPAP SERVO  Mask Type Full face mask  Mask Size Small  Set Rate 15 breaths/min  Respiratory Rate 25 breaths/min  IPAP 12 cmH20  EPAP 6 cmH2O  FiO2 (%) 90 %  Minute Ventilation 12.8  Leak 62  Peak Inspiratory Pressure (PIP) 11  Tidal Volume (Vt) 527  Patient Home Machine No  Patient Home Mask No  Patient Home Tubing No  Auto Titrate No  Press High Alarm 25 cmH2O  Device Plugged into RED Power Outlet Yes  BiPAP/CPAP /SiPAP Vitals  Pulse Rate 76  Resp (!) 25  SpO2 95 %  Bilateral Breath Sounds Diminished  MEWS Score/Color  MEWS Score 1  MEWS Score Color Green

## 2023-06-03 NOTE — Progress Notes (Signed)
 Rt took pt off BIPAP and placed on HHFNC 93%, 35 L.

## 2023-06-03 NOTE — Evaluation (Signed)
 Physical Therapy Evaluation Patient Details Name: Priscilla Cantrell MRN: [id number] DOB: [birth year] Today's Date: 06/03/2023  History of Present Illness  Patient is a 88 year old female who presented on 4/26 with SOB. Patient was admitted with sepsis, a fib with RVR, acute on chronic hypoxic respiratory failure, COPD exacerbation, acute on chronic diastolic CHF and CAP. 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  Clinical Impression  Pt admitted with above diagnosis. Per RN, SpO2 dropped to the 70s with bed to recliner transfer with OT earlier this morning. Therefore, mobility was deferred this session, pt performed seated BUE/LE exercises for strengthening. SpO2 85% on 35L with activity, 90-91% at rest on 35L. Noted h/o multiple hip dislocations, reviewed posterior precautions with pt as she was only able to recall 1 of 3. Pt currently with functional limitations due to the deficits listed below (see PT Problem List). Pt will benefit from acute skilled PT to increase their independence and safety with mobility to allow discharge.           If plan is discharge home, recommend the following: A little help with walking and/or transfers;A little help with bathing/dressing/bathroom;Assistance with cooking/housework;Assist for transportation;Help with stairs or ramp for entrance   Can travel by private vehicle        Equipment Recommendations None recommended by PT  Recommendations for Other Services       Functional Status Assessment Patient has had a recent decline in their functional status and demonstrates the ability to make significant improvements in function in a reasonable and predictable amount of time.     Precautions / Restrictions Precautions Precautions: Fall;Posterior Hip Recall of Precautions/Restrictions: Impaired Precaution/Restrictions Comments: monitor O2 and  HR. ideal range 88% to 92%; h/o multiple hip dislocations, reviewed posterior precautions (pt was able to recall 1 of 3)      Mobility  Bed Mobility                    Transfers                   General transfer comment: deferred per RN request 2* hypoxia with bed to recliner transfer with OT earlier this morning.    Ambulation/Gait                  Stairs            Wheelchair Mobility     Tilt Bed    Modified Rankin (Stroke Patients Only)       Balance                                             Pertinent Vitals/Pain Pain Assessment Pain Assessment: No/denies pain    Home Living Family/patient expects to be discharged to:: Private residence Living Arrangements: Spouse/significant other Available Help at Discharge: Available 24 hours/day Type of Home: House Home Access: Stairs to enter   [employer] of Steps: back 1+1   Home Layout: One level Home Equipment: Shower seat;Grab bars - tub/shower;Hand held shower head;Adaptive equipment;Rollator (4 wheels)      Prior Function Prior Level of Function : Independent/Modified Independent             Mobility Comments: no AD, 2L O2 baseline; 2 falls related to hip dislocations in past  6 months ADLs Comments: independent     Extremity/Trunk Assessment   Upper Extremity Assessment Upper Extremity Assessment: Defer to OT evaluation    Lower Extremity Assessment Lower Extremity Assessment: Overall WFL for tasks assessed    Cervical / Trunk Assessment Cervical / Trunk Assessment: Kyphotic  Communication   Communication Communication: No apparent difficulties    Cognition Arousal: Alert Behavior During Therapy: WFL for tasks assessed/performed   PT - Cognitive impairments: Memory                       PT - Cognition Comments: pt stated, "I have dementia." Following commands: Intact       Cueing       General Comments  General comments (skin integrity, edema, etc.): SpO2 85% with exercises on 35 L O2; SpO2 90-91% at rest on 35L    Exercises General Exercises - Upper Extremity Shoulder Flexion: AROM, Both, 10 reps, Seated Shoulder ABduction: AROM, Both, 10 reps, Seated General Exercises - Lower Extremity Ankle Circles/Pumps: AROM, Both, 20 reps, Seated Long Arc Quad: AROM, Both, 20 reps, Seated Hip Flexion/Marching: AROM, Both, 20 reps, Seated   Assessment/Plan    PT Assessment Patient needs continued PT services  PT Problem List Cardiopulmonary status limiting activity;Decreased activity tolerance       PT Treatment Interventions Gait training;Therapeutic exercise;Therapeutic activities;Functional mobility training    PT Goals (Current goals can be found in the Care Plan section)  Acute Rehab PT Goals Patient Stated Goal: likes to walk PT Goal Formulation: With patient/family Time For Goal Achievement: 06/17/23 Potential to Achieve Goals: Good    Frequency Min 3X/week     Co-evaluation               AM-PAC PT "6 Clicks" Mobility  Outcome Measure Help needed turning from your back to your side while in a flat bed without using bedrails?: A Little Help needed moving from lying on your back to sitting on the side of a flat bed without using bedrails?: A Little Help needed moving to and from a bed to a chair (including a wheelchair)?: A Little Help needed standing up from a chair using your arms (e.g., wheelchair or bedside chair)?: A Little Help needed to walk in hospital room?: A Lot Help needed climbing 3-5 steps with a railing? : A Lot 6 Click Score: 16    End of Session Equipment Utilized During Treatment: Gait belt Activity Tolerance: Patient tolerated treatment well Patient left: in chair;with call bell/phone within reach;with family/visitor present;with nursing/sitter in room Nurse Communication: Mobility status PT Visit Diagnosis: Difficulty in walking, not elsewhere  classified (R26.2);History of falling (Z91.81)    Time: 1478-2956 PT Time Calculation (min) (ACUTE ONLY): 20 min   Charges:   PT Evaluation $PT Eval Moderate Complexity: 1 Mod   PT General Charges $$ ACUTE PT VISIT: 1 Visit         Daymon Evans PT 06/03/2023  Acute Rehabilitation Services  Office [phone]

## 2023-06-03 NOTE — Consult Note (Signed)
 NAME:  Priscilla Cantrell, MRN:  [id number], DOB:  08-16-1935, LOS: 4 ADMISSION DATE:  05/30/2023, CONSULTATION DATE:  06/03/2023  REFERRING MD:  Livia Riffle , CHIEF COMPLAINT: Respiratory distress  History of Present Illness:  88 year old woman with severe COPD and chronic respiratory failure with hypoxia.  At baseline she uses 2 L at rest and 4 L with exertion.  She was admitted with 2 weeks of shortness of breath and cough with green sputum production and hypoxia to 70% on her baseline 2 L oxygen .  Chest x-ray showed bibasilar infiltrates, urine strep antigen was negative.  She was febrile tachycardic with leukocytosis. She was started on Levaquin  treated with BiPAP Course was complicated by new onset atrial fibrillation with RVR and elevated BNP requiring diuretics.  Cardiology was consulted, echo showed normal LVEF with moderate RV enlargement, moderate to severe TR.  Her weight decreased 5 pounds with diuresis but she continued to be more hypoxic and has required HHFNC. Hence PCCM consulted  Pertinent  Medical History  hypothyroidism, HTN, depression,  PFT 10/29/18 >> FEV1 1.05 (68%), FEV1% 52, TLC 4.29 (96%), DLCO 44%   Significant Hospital Events: Including procedures, antibiotic start and stop dates in addition to other pertinent events     Interim History / Subjective:  Denies chest pain Cough with minimal yellow sputum production Afebrile On HHFNC 34 L / 90%  Objective   Blood pressure (!) 151/67, pulse 65, temperature 98.4 F (36.9 C), temperature source Oral, resp. rate (!) 22, height 5\' 2"  (1.575 m), weight 53.1 kg, SpO2 91%.    FiO2 (%):  [80 %-93 %] 93 %   Intake/Output Summary (Last 24 hours) at 06/03/2023 1523 Last data filed at 06/03/2023 0600 Gross per 24 hour  Intake 340 ml  Output 400 ml  Net -60 ml   Filed Weights   05/30/23 1049 06/03/23 0500  Weight: 55.3 kg 53.1 kg    Examination: Gen. Pleasant, elderly, thin woman in no distress, normal affect, sitting  in the chair, on HHFNC ENT - no pallor,icterus, no post nasal drip Neck: No JVD, no thyromegaly, no carotid bruits Lungs: no use of accessory muscles, no dullness to percussion, decreased bilateral without rales or rhonchi  Cardiovascular: Rhythm regular, heart sounds  normal, no murmurs or gallops, no peripheral edema Abdomen: soft and non-tender, no hepatosplenomegaly, BS normal. Musculoskeletal: No deformities, no cyanosis or clubbing Neuro:  alert, non focal  Labs show normal electrolytes, leukocytosis, BNP decreased from 834-433   Resolved Hospital Problem list     Assessment & Plan:  Acute on chronic hypoxic respiratory failure Bibasilar pneumonia/community-acquired with worsening infiltrates  -Continue HHFNC -Encourage pulm toilet with flutter valve, chest PT and hypertonic saline nebs - Continue IV Levaquin , check urine Legionella for completion Triple therapy nebs  Atrial fibrillation/RVR -rate controlled on Cardizem and Toprol, cardiology following  Best Practice (right click and "Reselect all SmartList Selections" daily)    Code Status:  full code Last date of multidisciplinary goals of care discussion [NA]  Labs   CBC: Recent Labs  Lab 05/30/23 0042 05/31/23 0411 06/01/23 0337  WBC 18.8* 19.2* 18.4*  NEUTROABS 16.8*  --   --   HGB 11.2* 10.8* 11.2*  HCT 34.2* 34.3* 36.1  MCV 91.2 92.0 92.3  PLT 269 249 240    Basic Metabolic Panel: Recent Labs  Lab 05/31/23 0411 05/31/23 1650 06/01/23 0837 06/02/23 0348 06/03/23 0416  NA 136 139 142 142 139  K 3.8 4.2 3.8 3.5 3.9  CL  103 106 101 101 99  CO2 24 24 29  32 29  GLUCOSE 112* 125* 142* 160* 153*  BUN 28* 26* 31* 38* 39*  CREATININE 0.70 0.59 0.71 0.83 0.80  CALCIUM  8.5* 8.4* 8.8* 8.7* 8.8*  MG  --   --   --   --  1.9   GFR: Estimated Creatinine Clearance: 39.9 mL/min (by C-G formula based on SCr of 0.8 mg/dL). Recent Labs  Lab 05/30/23 0042 05/30/23 0113 05/31/23 0411 05/31/23 1026  06/01/23 0337  PROCALCITON  --   --   --  2.70  --   WBC 18.8*  --  19.2*  --  18.4*  LATICACIDVEN  --  1.0  --   --   --     Liver Function Tests: Recent Labs  Lab 05/30/23 0042 05/31/23 1650  AST 19 16  ALT 21 24  ALKPHOS 66 56  BILITOT 1.0 0.8  PROT 6.1* 5.1*  ALBUMIN 3.1* 2.3*   No results for input(s): "LIPASE", "AMYLASE" in the last 168 hours. No results for input(s): "AMMONIA" in the last 168 hours.  ABG    Component Value Date/Time   PHART 7.4 05/31/2023 0120   PCO2ART 45 05/31/2023 0120   PO2ART 53 (L) 05/31/2023 0120   HCO3 27.8 05/31/2023 0120   TCO2 22.9 01/24/2015 2336   ACIDBASEDEF 2.5 (H) 01/24/2015 2336   O2SAT 91.2 05/31/2023 0120     Coagulation Profile: Recent Labs  Lab 05/30/23 0042  INR 1.1    Cardiac Enzymes: No results for input(s): "CKTOTAL", "CKMB", "CKMBINDEX", "TROPONINI" in the last 168 hours.  HbA1C: Hgb A1c MFr Bld  Date/Time Value Ref Range Status  10/29/2011 03:58 PM 5.7 4.6 - 6.5 % Final    Comment:    Glycemic Control Guidelines for People with Diabetes:Non Diabetic:  <6%Goal of Therapy: <7%Additional Action Suggested:  >8%     CBG: No results for input(s): "GLUCAP" in the last 168 hours.  Review of Systems:   Constitutional: negative for anorexia, fevers and sweats  Eyes: negative for irritation, redness and visual disturbance  Ears, nose, mouth, throat, and face: negative for earaches, epistaxis, nasal congestion and sore throat  Cardiovascular: negative for chest pain,  lower extremity edema, orthopnea, palpitations and syncope  Gastrointestinal: negative for abdominal pain, constipation, diarrhea, melena, nausea and vomiting  Genitourinary:negative for dysuria, frequency and hematuria  Hematologic/lymphatic: negative for bleeding, easy bruising and lymphadenopathy  Musculoskeletal:negative for arthralgias, muscle weakness and stiff joints  Neurological: negative for coordination problems, gait problems, headaches  and weakness  Endocrine: negative for diabetic symptoms including polydipsia, polyuria and weight loss   Past Medical History:  She,  has a past medical history of Allergic rhinitis, BPPV (benign paroxysmal positional vertigo) (03/2018), C. difficile colitis, Chest pain, non-cardiac, Chronic cystitis with hematuria, Chronic hypoxemic respiratory failure (HCC) (02/02/2015), Chronic renal insufficiency, stage 2 (mild), Cold hands, COPD (chronic obstructive pulmonary disease) (HCC), DDD (degenerative disc disease), cervical, DDD (degenerative disc disease), lumbar, Depression with anxiety (04/15/2011), Diarrheal disease (Summer 2017), Diverticulosis of colon, Dysplastic nevus of upper extremity (04/2014), Edema of both lower extremities due to peripheral venous insufficiency, Family history of adverse reaction to anesthesia, Fibrocystic breast disease, GERD (gastroesophageal reflux disease), History of double vision, History of home oxygen  therapy, Hypertension, Hypothyroidism, Idiopathic peripheral neuropathy (04/2018), Lesion of right native kidney (2017), OAB (overactive bladder), Osteoarthritis, hip, bilateral, Osteoporosis, Pneumonia, Postmenopausal atrophic vaginitis, Prolapse of vaginal cuff after hysterectomy, Pulmonary nodule, Recurrent Clostridium difficile diarrhea, Recurrent UTI, Rib fracture (09/2018),  RLS (restless legs syndrome), Shingles (02/2014), and Spinal stenosis of lumbar region with neurogenic claudication.   Surgical History:   Past Surgical History:  Procedure Laterality Date   ABDOMINAL HYSTERECTOMY  1978   no BSO per pt--nonmalignant reasons   APPENDECTOMY     BREAST CYST EXCISION     Last screening mammogram 10/2010 was normal.   CHOLECYSTECTOMY  2001   COLONOSCOPY  04/2005; 12/06/2015   2007 NORMAL.  2017 adenomatous polyp x 1.  Mod sigmoid diverticulosis (Dr. Andriette Keeling).     DEXA  05/18/2017; 08/7019   2019 NORMAL (T-score 0.1).  08/2019->T score -2.5->alendronate  started.   03/2022 T score -2.7   HIP CLOSED REDUCTION Right 12/25/2015   Procedure: CLOSED REDUCTION HIP;  Surgeon: Janeth Medicus, MD;  Location: [facility];  Service: Orthopedics;  Laterality: Right;   NCV/EMS  10/2018   chronic and symmetric sensorimotor axonal polyneuropathy affecting the lower extremities   TONSILLECTOMY     TOTAL HIP ARTHROPLASTY Right 11/01/2014   Procedure: RIGHT TOTAL HIP ARTHROPLASTY;  Surgeon: Hazle Lites, MD;  Location: [facility];  Service: Orthopedics;  Laterality: Right;   TOTAL HIP ARTHROPLASTY Left 12/08/2017   Procedure: LEFT TOTAL HIP ARTHROPLASTY ANTERIOR APPROACH;  Surgeon: Claiborne Crew, MD;  Location: [facility];  Service: Orthopedics;  Laterality: Left;  70 mins   TOTAL HIP REVISION Right 02/10/2023   Procedure: RIGHT TOTAL HIP REVISION;  Surgeon: Claiborne Crew, MD;  Location: [facility];  Service: Orthopedics;  Laterality: Right;   TRANSTHORACIC ECHOCARDIOGRAM  08/2014   2016 EF 60-65%, grade I diast dysfxn. 06/21/21 unchanged.   TUBAL LIGATION  1974     Social History:   reports that she quit smoking about 33 years ago. Her smoking use included cigarettes. She started smoking about 67 years ago. She has a 51 pack-year smoking history. She has never used smokeless tobacco. She reports current alcohol  use. She reports that she does not use drugs.   Family History:  Her family history includes Cancer in her maternal aunt and paternal grandfather; Cirrhosis in her father; Diabetes in her daughter; Goiter in her mother; Hernia in her paternal grandmother; Hypertension in her paternal grandmother; Psoriasis in her father; Stroke in her maternal grandfather and paternal grandmother.   Allergies Allergies  Allergen Reactions   Losartan  Other (See Comments)    hyperkalemia   Penicillins Hives, Swelling and Rash    Has patient had a PCN reaction causing immediate rash, facial/tongue/throat swelling, SOB or lightheadedness with hypotension: YES Has patient had a PCN reaction  causing severe rash involving mucus membranes or skin necrosis: NO Has patient had a PCN reaction that required hospitalization NO Has patient had a PCN reaction occurring within the last 10 years: NO If all of the above answers are "NO", then may proceed with Cephalosporin use.    Cefixime [Kdc:Cefixime] Other (See Comments)    unspecified   Gabapentin  Other (See Comments)    Feels woozy   Indocin [Indomethacin] Other (See Comments)    Painful tongue and throat   Singulair  [Montelukast  Sodium]     Chest tightness, decreased mental clarity   Sulfa Antibiotics Other (See Comments)    unspecified   Ace Inhibitors Other (See Comments)    cough   Statins Other (See Comments)    Myalgias      Home Medications  Prior to Admission medications   Medication Sig Start Date End Date Taking? Authorizing Provider  acetaminophen  (TYLENOL ) 325 MG tablet Take 2 tablets (650 mg  total) by mouth every 6 (six) hours. Patient taking differently: Take 650 mg by mouth every 6 (six) hours as needed for moderate pain (pain score 4-6) or fever. 02/14/23  Yes Gonfa, Taye T, MD  albuterol  (PROAIR  HFA) 108 (90 Base) MCG/ACT inhaler Inhale 2 puffs into the lungs every 4 (four) hours as needed for wheezing or shortness of breath. 12/30/22  Yes Mannam, Praveen, MD  albuterol  (PROVENTIL ) (2.5 MG/3ML) 0.083% nebulizer solution Take 2.5 mg by nebulization every 6 (six) hours as needed for wheezing or shortness of breath.   Yes [provider]  amLODipine  (NORVASC ) 10 MG tablet Take 1 tablet (10 mg total) by mouth daily. 04/14/23  Yes McGowen, Minetta Aly, MD  azithromycin  (ZITHROMAX ) 250 MG tablet 2 tabs po qd x 1d, then 1 tab po qd x 4d 05/28/23  Yes McGowen, Minetta Aly, MD  fluticasone  (FLONASE ) 50 MCG/ACT nasal spray Place 2 sprays into both nostrils daily. 05/28/23  Yes McGowen, Minetta Aly, MD  Fluticasone -Umeclidin-Vilant (TRELEGY ELLIPTA ) 100-62.5-25 MCG/ACT AEPB Inhale 1 puff into the lungs daily. 05/07/23  Yes  Antonio Baumgarten, NP  levothyroxine  (SYNTHROID ) 100 MCG tablet TAKE 1 TABLET BY MOUTH EVERY DAY 04/14/23  Yes McGowen, Minetta Aly, MD  lidocaine  (XYLOCAINE ) 2 % solution Apply 1/2 ml to affected area of mouth 3 times per day as needed for pain 03/05/23  Yes McGowen, Minetta Aly, MD  pantoprazole  (PROTONIX ) 40 MG tablet Take 1 tablet (40 mg total) by mouth 2 (two) times daily. 04/22/23  Yes McGowen, Minetta Aly, MD  predniSONE  (DELTASONE ) 10 MG tablet 4 tabs po every day x 4d, then 3 tabs po every day x 4d, then 2 tabs po every day x 4d, then 1 tab po every day x 4d 05/28/23  Yes McGowen, Minetta Aly, MD  venlafaxine  (EFFEXOR ) 50 MG tablet Take 50 mg by mouth 2 (two) times daily. 11/28/22  Yes [provider]  Misc. Devices (PULSE OXIMETER) MISC Check oxygen  level as needed 03/12/22   McGowen, Minetta Aly, MD      Celene Coins MD. FCCP. [facility] Pager : [phone]  If no response to pager , please call [phone] until 7 pm After 7:00 pm call Elink  [phone]   06/03/2023

## 2023-06-03 NOTE — Evaluation (Signed)
 Occupational Therapy Evaluation Patient Details Name: Priscilla Cantrell MRN: [id number] DOB: [birth year] Today's Date: 06/03/2023   History of Present Illness   Patient is a 88 year old female who presented on 4/26 with SOB. Patient was admitted with sepsis, a fib with RVR, acute on chronic hypoxic respiratory failure, COPD exacerbation, acute on chronic diastolic CHF and CAP. 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     Clinical Impressions Patient is a 88 year old female who was admitted for above. Patient was living independently on 2L/min at baseline at home. Currently, patient is on 35% FiO2 with patient dropping to 76% with transfer with significant time to return to 90% on heated high flow with minimal activity. Patient appeared to have decreased insight to deficits with education provided after patient asked therapist not to leave and to continue movemnets today. Nurse and therapist educated patient on importance of taking movements one step at a time and not forcing more activity too fast. Patient was noted to have decreased functional activity tolerance, decreased endurance, decreased standing balance, decreased safety awareness, and decreased knowledge of AD/AE impacting participation in ADLs. Patient will benefit from continued inpatient follow up therapy, <3 hours/day.      If plan is discharge home, recommend the following:   A lot of help with bathing/dressing/bathroom;A lot of help with walking and/or transfers;Assistance with cooking/housework;Direct supervision/assist for medications management;Assist for transportation;Help with stairs or ramp for entrance;Direct supervision/assist for financial management     Functional Status Assessment   Patient has had a recent decline in their functional status and demonstrates the ability to make significant improvements  in function in a reasonable and predictable amount of time.     Equipment Recommendations   None recommended by OT      Precautions/Restrictions   Precautions Precautions: Fall Precaution/Restrictions Comments: monitor O2 and HR. ideal range 88% to 92%     Mobility Bed Mobility Overal bed mobility: Needs Assistance Bed Mobility: Supine to Sit     Supine to sit: Min assist     General bed mobility comments: with increased time and using bed rail.    Transfers                          Balance Overall balance assessment: Mild deficits observed, not formally tested             ADL either performed or assessed with clinical judgement   ADL Overall ADL's : Needs assistance/impaired Eating/Feeding: Set up;Supervision/ safety;Sitting Eating/Feeding Details (indicate cue type and reason): in recliner Grooming: Minimal assistance   Upper Body Bathing: Minimal assistance   Lower Body Bathing: Maximal assistance   Upper Body Dressing : Minimal assistance   Lower Body Dressing: Maximal assistance Lower Body Dressing Details (indicate cue type and reason): to don slide on shoes with patient having such poor functional actiivty tolerance during session. Toilet Transfer: Contact guard assist;Rolling walker (2 wheels) Toilet Transfer Details (indicate cue type and reason): to transfer from EOB to recliner in room with significant ammount of time for deep breathing to get stats back up to 88%. nurse called into room with frist 2 mins patient having difficulty getting stats up and reported need to blow nose. Toileting- Clothing Manipulation and Hygiene: Total assistance;Sit to/from stand               Vision Patient Visual Report: No  change from baseline              Pertinent Vitals/Pain Pain Assessment Pain Assessment: No/denies pain     Extremity/Trunk Assessment Upper Extremity Assessment Upper Extremity Assessment: Generalized weakness;Right  hand dominant   Lower Extremity Assessment Lower Extremity Assessment: Defer to PT evaluation   Cervical / Trunk Assessment Cervical / Trunk Assessment: Kyphotic   Communication     Cognition Arousal: Alert Behavior During Therapy: WFL for tasks assessed/performed Cognition: No apparent impairments             OT - Cognition Comments: patients significant other was present in room during session.                                    Home Living Family/patient expects to be discharged to:: Private residence Living Arrangements: Spouse/significant other Available Help at Discharge: Available 24 hours/day Type of Home: House Home Access: Stairs to enter [employer] of Steps: back 1+1   Home Layout: One level     Bathroom Shower/Tub: Walk-in shower;Tub/shower unit   Bathroom Toilet: Standard     Home Equipment: Shower seat;Grab bars - tub/shower;Hand held shower head;Adaptive equipment;Rollator (4 wheels) Adaptive Equipment: Reacher        Prior Functioning/Environment Prior Level of Function : Independent/Modified Independent             Mobility Comments: no AD, 2L O2 baseline; 2 falls related to hip dislocations in past 6 months ADLs Comments: independent    OT Problem List: Decreased activity tolerance;Impaired balance (sitting and/or standing);Decreased coordination;Decreased safety awareness;Cardiopulmonary status limiting activity   OT Treatment/Interventions: Self-care/ADL training;Therapeutic exercise;Neuromuscular education;Therapeutic activities;Patient/family education;Balance training      OT Goals(Current goals can be found in the care plan section)   Acute Rehab OT Goals Patient Stated Goal: to get back home with significant other. OT Goal Formulation: With patient/family Time For Goal Achievement: 06/17/23 Potential to Achieve Goals: Fair   OT Frequency:  Min 2X/week       AM-PAC OT "6 Clicks" Daily Activity      Outcome Measure Help from another person eating meals?: A Little Help from another person taking care of personal grooming?: A Little Help from another person toileting, which includes using toliet, bedpan, or urinal?: A Lot Help from another person bathing (including washing, rinsing, drying)?: A Lot Help from another person to put on and taking off regular upper body clothing?: A Little Help from another person to put on and taking off regular lower body clothing?: A Lot 6 Click Score: 15   End of Session Equipment Utilized During Treatment: Gait belt;Oxygen  Nurse Communication: Other (comment) (nurse called into room after transfer with patient requesting to blow nose with O2 still at 85% after two mins of breathing after transfer.)  Activity Tolerance: Treatment limited secondary to medical complications (Comment);Patient limited by fatigue Patient left: in chair;with call bell/phone within reach;with chair alarm set;with nursing/sitter in room;with family/visitor present  OT Visit Diagnosis: Other abnormalities of gait and mobility (R26.89);Muscle weakness (generalized) (M62.81);History of falling (Z91.81)                Time: 0981-1914 OT Time Calculation (min): 33 min Charges:  OT General Charges $OT Visit: 1 Visit OT Evaluation $OT Eval Moderate Complexity: 1 Mod OT Treatments $Self Care/Home Management : 8-22 mins  Priscilla Heckler, MS Acute Rehabilitation Department Office# [phone]   Jame Maze 06/03/2023,  10:57 AM

## 2023-06-03 NOTE — Telephone Encounter (Signed)
 Patient Product/process development scientist completed.    The patient is insured through [employer]. Patient has Medicare and is not eligible for a copay card, but may be able to apply for patient assistance or Medicare RX Payment Plan (Patient Must reach out to their plan, if eligible for payment plan), if available.    Ran test claim for Eliquis 5 mg and the current 30 day co-pay is $47.00.   This test claim was processed through [facility]- copay amounts may vary at other pharmacies due to pharmacy/plan contracts, or as the patient moves through the different stages of their insurance plan.     Roland Earl, CPHT Pharmacy Technician III Certified Patient Advocate [facility] Pharmacy Patient Advocate Team Direct Number: [phone]  Fax: [phone]

## 2023-06-03 NOTE — Progress Notes (Signed)
 PROGRESS NOTE    Priscilla Cantrell  WGN:[id number] DOB: 1935-07-24 DOA: 05/30/2023 PCP: Shelvia Dick, MD   Brief Narrative:  88 y.o. female with medical history significant for CKD stage II, depression, chronic hypoxic respiratory failure due to COPD on 2 L at baseline presented with worsening shortness of breath and cough and was admitted for sepsis due to community-acquired pneumonia along with acute on chronic hypoxic respiratory failure and COPD and CHF exacerbation.  She required BiPAP on presentation.  She has been on IV antibiotics, Solu-Medrol  and Lasix .  Cardiology following.    Assessment & Plan:   Acute on chronic hypoxic respiratory failure Sepsis: Present on admission Community-acquired pneumonia Possible COPD exacerbation Acute on chronic diastolic CHF -Respiratory status has not improved.  Required BiPAP on presentation and is still requiring BiPAP at night and 35 L high flow nasal cannula oxygen  during daytime.  Patient has been on IV Levaquin  along with IV Solu-Medrol  and Lasix .  Increase IV Solu-Medrol  to 80 mg IV every 12 hours.  Continue nebs.  Add Pulmicort .  Patient requesting pulmonary evaluation.  Will consult pulmonary - Cardiology following.  Diuretics as per cardiology.  Echo showed EF of 60 to 65% with grade 1 diastolic dysfunction.  Continue strict input and output.  Daily weight.  Fluid restriction.  Negative balance of 1974.4 cc since admission.  Paroxysmal A-fib with RVR - Currently rate controlled.  Patient has been started on Eliquis as appropriate dosing by cardiology.  Continue metoprolol succinate and Cardizem  Leukocytosis - No labs today.  Monitor  CKD stage II - Creatinine stable.  Monitor  Hypothyroidism -Continue levothyroxine   Hypertension -Monitor blood pressure.  Continue Cardizem, metoprolol succinate, Lasix .  Amlodipine  on hold currently  GERD -Continue Protonix   Physical deconditioning - Will need PT eval once respiratory status  improves   DVT prophylaxis: Eliquis Code Status: Full Family Communication: Husband at bedside Disposition Plan: Status is: Inpatient Remains inpatient appropriate because: Of severity of illness  Consultants: Cardiology.  Consult pulmonary  Procedures: 2D echo  Antimicrobials:  Anti-infectives (From admission, onward)    Start     Dose/Rate Route Frequency Ordered Stop   05/31/23 1730  levofloxacin  (LEVAQUIN ) IVPB 500 mg        500 mg 100 mL/hr over 60 Minutes Intravenous Every 24 hours 05/31/23 1631     05/30/23 0900  doxycycline  (VIBRAMYCIN ) 100 mg in sodium chloride  0.9 % 250 mL IVPB  Status:  Discontinued        100 mg 125 mL/hr over 120 Minutes Intravenous Every 12 hours 05/30/23 0848 05/31/23 1631   05/30/23 0045  aztreonam (AZACTAM) 2 g in sodium chloride  0.9 % 100 mL IVPB        2 g 200 mL/hr over 30 Minutes Intravenous  Once 05/30/23 0037 05/30/23 0323   05/30/23 0045  metroNIDAZOLE  (FLAGYL ) IVPB 500 mg        500 mg 100 mL/hr over 60 Minutes Intravenous  Once 05/30/23 0037 05/30/23 0323   05/30/23 0045  vancomycin  (VANCOCIN ) IVPB 1000 mg/200 mL premix        1,000 mg 200 mL/hr over 60 Minutes Intravenous  Once 05/30/23 0037 05/30/23 0323        Subjective: Patient seen and examined at bedside.  Does not feel better.  Still short of breath with exertion with intermittent cough.  Denies any chest pain.  No fever or vomiting reported.  Objective: Vitals:   06/03/23 0500 06/03/23 7846 06/03/23 9629 06/03/23 5284  BP:  (!) 160/78    Pulse:  69    Resp:  16    Temp:  97.8 F (36.6 C)    TempSrc:      SpO2:  97% 93% 93%  Weight: 53.1 kg     Height:        Intake/Output Summary (Last 24 hours) at 06/03/2023 0953 Last data filed at 06/03/2023 0600 Gross per 24 hour  Intake 340 ml  Output 1200 ml  Net -860 ml   Filed Weights   05/30/23 1049 06/03/23 0500  Weight: 55.3 kg 53.1 kg    Examination:  General exam: Appears calm and comfortable.  Looks  chronically ill and deconditioned.  Elderly female in bed.  On 35 L high flow nasal cannula oxygen . Respiratory system: Bilateral decreased breath sounds at bases with scattered crackles Cardiovascular system: S1 & S2 heard, Rate controlled Gastrointestinal system: Abdomen is nondistended, soft and nontender. Normal bowel sounds heard. Extremities: No cyanosis, clubbing; trace lower extremity edema  Central nervous system: Alert and oriented. No focal neurological deficits. Moving extremities Skin: No rashes, lesions or ulcers Psychiatry: Flat affect.  Not agitated  Data Reviewed: I have personally reviewed following labs and imaging studies  CBC: Recent Labs  Lab 05/30/23 0042 05/31/23 0411 06/01/23 0337  WBC 18.8* 19.2* 18.4*  NEUTROABS 16.8*  --   --   HGB 11.2* 10.8* 11.2*  HCT 34.2* 34.3* 36.1  MCV 91.2 92.0 92.3  PLT 269 249 240   Basic Metabolic Panel: Recent Labs  Lab 05/31/23 0411 05/31/23 1650 06/01/23 0837 06/02/23 0348 06/03/23 0416  NA 136 139 142 142 139  K 3.8 4.2 3.8 3.5 3.9  CL 103 106 101 101 99  CO2 24 24 29  32 29  GLUCOSE 112* 125* 142* 160* 153*  BUN 28* 26* 31* 38* 39*  CREATININE 0.70 0.59 0.71 0.83 0.80  CALCIUM  8.5* 8.4* 8.8* 8.7* 8.8*  MG  --   --   --   --  1.9   GFR: Estimated Creatinine Clearance: 39.9 mL/min (by C-G formula based on SCr of 0.8 mg/dL). Liver Function Tests: Recent Labs  Lab 05/30/23 0042 05/31/23 1650  AST 19 16  ALT 21 24  ALKPHOS 66 56  BILITOT 1.0 0.8  PROT 6.1* 5.1*  ALBUMIN 3.1* 2.3*   No results for input(s): "LIPASE", "AMYLASE" in the last 168 hours. No results for input(s): "AMMONIA" in the last 168 hours. Coagulation Profile: Recent Labs  Lab 05/30/23 0042  INR 1.1   Cardiac Enzymes: No results for input(s): "CKTOTAL", "CKMB", "CKMBINDEX", "TROPONINI" in the last 168 hours. BNP (last 3 results) No results for input(s): "PROBNP" in the last 8760 hours. HbA1C: No results for input(s): "HGBA1C" in  the last 72 hours. CBG: No results for input(s): "GLUCAP" in the last 168 hours. Lipid Profile: No results for input(s): "CHOL", "HDL", "LDLCALC", "TRIG", "CHOLHDL", "LDLDIRECT" in the last 72 hours. Thyroid  Function Tests: Recent Labs    05/31/23 1026 06/01/23 1502  TSH 0.313*  --   FREET4  --  2.15*   Anemia Panel: No results for input(s): "VITAMINB12", "FOLATE", "FERRITIN", "TIBC", "IRON", "RETICCTPCT" in the last 72 hours. Sepsis Labs: Recent Labs  Lab 05/30/23 0113 05/31/23 1026  PROCALCITON  --  2.70  LATICACIDVEN 1.0  --     Recent Results (from the past 240 hours)  Resp panel by RT-PCR (RSV, Flu A&B, Covid) Anterior Nasal Swab     Status: None   Collection Time: 05/30/23  12:42 AM   Specimen: Anterior Nasal Swab  Result Value Ref Range Status   SARS Coronavirus 2 by RT PCR NEGATIVE NEGATIVE Final    Comment: (NOTE) SARS-CoV-2 target nucleic acids are NOT DETECTED.  The SARS-CoV-2 RNA is generally detectable in upper respiratory specimens during the acute phase of infection. The lowest concentration of SARS-CoV-2 viral copies this assay can detect is 138 copies/mL. A negative result does not preclude SARS-Cov-2 infection and should not be used as the sole basis for treatment or other patient management decisions. A negative result may occur with  improper specimen collection/handling, submission of specimen other than nasopharyngeal swab, presence of viral mutation(s) within the areas targeted by this assay, and inadequate number of viral copies(<138 copies/mL). A negative result must be combined with clinical observations, patient history, and epidemiological information. The expected result is Negative.  Fact Sheet for Patients:  BloggerCourse.com  Fact Sheet for Healthcare Providers:  SeriousBroker.it  This test is no t yet approved or cleared by the [place]  FDA and  has been authorized for detection  and/or diagnosis of SARS-CoV-2 by FDA under an Emergency Use Authorization (EUA). This EUA will remain  in effect (meaning this test can be used) for the duration of the COVID-19 declaration under Section 564(b)(1) of the Act, 21 U.S.C.section 360bbb-3(b)(1), unless the authorization is terminated  or revoked sooner.       Influenza A by PCR NEGATIVE NEGATIVE Final   Influenza B by PCR NEGATIVE NEGATIVE Final    Comment: (NOTE) The Xpert Xpress SARS-CoV-2/FLU/RSV plus assay is intended as an aid in the diagnosis of influenza from Nasopharyngeal swab specimens and should not be used as a sole basis for treatment. Nasal washings and aspirates are unacceptable for Xpert Xpress SARS-CoV-2/FLU/RSV testing.  Fact Sheet for Patients: BloggerCourse.com  Fact Sheet for Healthcare Providers: SeriousBroker.it  This test is not yet approved or cleared by the [place]  FDA and has been authorized for detection and/or diagnosis of SARS-CoV-2 by FDA under an Emergency Use Authorization (EUA). This EUA will remain in effect (meaning this test can be used) for the duration of the COVID-19 declaration under Section 564(b)(1) of the Act, 21 U.S.C. section 360bbb-3(b)(1), unless the authorization is terminated or revoked.     Resp Syncytial Virus by PCR NEGATIVE NEGATIVE Final    Comment: (NOTE) Fact Sheet for Patients: BloggerCourse.com  Fact Sheet for Healthcare Providers: SeriousBroker.it  This test is not yet approved or cleared by the [place]  FDA and has been authorized for detection and/or diagnosis of SARS-CoV-2 by FDA under an Emergency Use Authorization (EUA). This EUA will remain in effect (meaning this test can be used) for the duration of the COVID-19 declaration under Section 564(b)(1) of the Act, 21 U.S.C. section 360bbb-3(b)(1), unless the authorization is terminated  or revoked.  Performed at [facility], [address], [place], [place] [address]   Urine culture     Status: Abnormal   Collection Time: 05/30/23 12:42 AM   Specimen: Urine, Clean Catch  Result Value Ref Range Status   Specimen Description   Final    URINE, CLEAN CATCH Performed at [facility], [address], [place], [place] [address]    Special Requests   Final    NONE Performed at [facility], [address] [place], [place] [address]    Culture (A)  Final    <10,000 COLONIES/mL INSIGNIFICANT GROWTH Performed at [facility], [address]  [place], [place] [address]    Report Status 05/31/2023 FINAL  Final  Blood Culture (routine x 2)     Status: None (Preliminary result)   Collection Time: 05/30/23  1:22 AM   Specimen: BLOOD RIGHT FOREARM  Result Value Ref Range Status   Specimen Description   Final    BLOOD RIGHT FOREARM Performed at [facility], [address] [place], [place] [address]    Special Requests   Final    BOTTLES DRAWN AEROBIC AND ANAEROBIC Blood Culture adequate volume Performed at [facility], [address], [place], [place] [address]    Culture   Final    NO GROWTH 4 DAYS Performed at [facility], [address], [place], [place] [address]    Report Status PENDING  Incomplete  Blood Culture (routine x 2)     Status: None (Preliminary result)   Collection Time: 05/30/23  1:32 AM   Specimen: BLOOD RIGHT HAND  Result Value Ref Range Status   Specimen Description   Final    BLOOD RIGHT HAND Performed at [facility], [address], [place], [place] [address]    Special Requests   Final    BOTTLES DRAWN AEROBIC AND ANAEROBIC Blood Culture adequate volume Performed at [facility], [address], [place], [place] [address]    Culture   Final    NO GROWTH 4 DAYS Performed at [facility], [address], [place], [place] [address]    Report Status PENDING  Incomplete         Radiology Studies: ECHOCARDIOGRAM COMPLETE Result Date: 06/02/2023    ECHOCARDIOGRAM REPORT   Patient Name:   Priscilla Cantrell Date of Exam: 06/02/2023 Medical Rec #:  [id number]        Height:       62.0 in Accession #:    [id number]       Weight:       121.9 lb Date of Birth:  [birth year]         BSA:          1.549 m Patient Age:    88 years         BP:           147/80 mmHg Patient Gender: F                HR:           76 bpm. Exam Location:  Inpatient Procedure: 2D Echo, Color Doppler and Cardiac Doppler (Both Spectral and Color            Flow Doppler were utilized during procedure). Indications:    I50.31 Acute diastolic (congestive) heart failure  History:        Patient has prior history of Echocardiogram examinations, most                 recent 06/21/2021. Signs/Symptoms:Shortness of Breath; Risk                 Factors:Hypertension.  Sonographer:    Andrena Bang Referring Phys: [phone] TRACI R TURNER  Sonographer Comments: Pt sitting up. SOB and coughing throughout exam. IMPRESSIONS  1. Left ventricular ejection fraction, by estimation, is 60 to 65%. The left ventricle has normal function. The left ventricle has no regional wall motion abnormalities. Left ventricular diastolic parameters are consistent with Grade I diastolic dysfunction (impaired relaxation). Elevated left atrial pressure.  2. Right ventricular  systolic function is normal. The right ventricular size is moderately enlarged. There is severely elevated pulmonary artery systolic pressure.  3. Right atrial size was moderately dilated.  4. The mitral valve is normal in structure. No evidence of mitral valve regurgitation.  5. Tricuspid valve regurgitation is moderate to severe.  6. The aortic valve is tricuspid. Aortic valve regurgitation is not visualized. No aortic stenosis is present.  7. There is borderline dilatation of the ascending aorta, measuring 38 mm.   8. The inferior vena cava is dilated in size with <50% respiratory variability, suggesting right atrial pressure of 15 mmHg. Comparison(s): Prior images reviewed side by side. There is interval dilation of the right ventricle, worsened tricuspid insufficiency and higher systolic pulmonary artery pressure. FINDINGS  Left Ventricle: Left ventricular ejection fraction, by estimation, is 60 to 65%. The left ventricle has normal function. The left ventricle has no regional wall motion abnormalities. The left ventricular internal cavity size was normal in size. There is  no left ventricular hypertrophy. Left ventricular diastolic parameters are consistent with Grade I diastolic dysfunction (impaired relaxation). Elevated left atrial pressure. Right Ventricle: The right ventricular size is moderately enlarged. Right vetricular wall thickness was not well visualized. Right ventricular systolic function is normal. There is severely elevated pulmonary artery systolic pressure. The tricuspid regurgitant velocity is 3.49 m/s, and with an assumed right atrial pressure of 15 mmHg, the estimated right ventricular systolic pressure is 63.7 mmHg. Left Atrium: Left atrial size was normal in size. Right Atrium: Right atrial size was moderately dilated. Pericardium: There is no evidence of pericardial effusion. Mitral Valve: The mitral valve is normal in structure. No evidence of mitral valve regurgitation. Tricuspid Valve: The tricuspid valve is normal in structure. Tricuspid valve regurgitation is moderate to severe. Aortic Valve: The aortic valve is tricuspid. Aortic valve regurgitation is not visualized. No aortic stenosis is present. Aortic valve mean gradient measures 4.0 mmHg. Aortic valve peak gradient measures 8.6 mmHg. Aortic valve area, by VTI measures 2.12 cm. Pulmonic Valve: The pulmonic valve was grossly normal. Pulmonic valve regurgitation is trivial. No evidence of pulmonic stenosis. Aorta: The aortic root is normal in  size and structure. There is borderline dilatation of the ascending aorta, measuring 38 mm. Venous: The inferior vena cava is dilated in size with less than 50% respiratory variability, suggesting right atrial pressure of 15 mmHg. IAS/Shunts: The interatrial septum was not well visualized.  LEFT VENTRICLE PLAX 2D LVIDd:         4.30 cm     Diastology LVIDs:         2.90 cm     LV e' medial:    3.70 cm/s LV PW:         0.90 cm     LV E/e' medial:  22.3 LV IVS:        0.70 cm     LV e' lateral:   8.49 cm/s LVOT diam:     2.00 cm     LV E/e' lateral: 9.7 LV SV:         62 LV SV Index:   40 LVOT Area:     3.14 cm  LV Volumes (MOD) LV vol d, MOD A2C: 55.8 ml LV vol d, MOD A4C: 72.3 ml LV vol s, MOD A2C: 22.0 ml LV vol s, MOD A4C: 19.5 ml LV SV MOD A2C:     33.8 ml LV SV MOD A4C:     72.3 ml LV SV MOD BP:  42.9 ml LEFT ATRIUM             Index LA diam:        3.80 cm 2.45 cm/m LA Vol (A2C):   40.3 ml 26.02 ml/m LA Vol (A4C):   22.7 ml 14.65 ml/m LA Biplane Vol: 30.6 ml 19.75 ml/m  AORTIC VALVE AV Area (Vmax):    1.87 cm AV Area (Vmean):   1.82 cm AV Area (VTI):     2.12 cm AV Vmax:           147.00 cm/s AV Vmean:          85.900 cm/s AV VTI:            0.290 m AV Peak Grad:      8.6 mmHg AV Mean Grad:      4.0 mmHg LVOT Vmax:         87.70 cm/s LVOT Vmean:        49.700 cm/s LVOT VTI:          0.196 m LVOT/AV VTI ratio: 0.68  AORTA Ao Asc diam: 3.80 cm MITRAL VALVE               TRICUSPID VALVE MV Area (PHT): 3.81 cm    TR Peak grad:   48.7 mmHg MV Decel Time: 199 msec    TR Vmax:        349.00 cm/s MV E velocity: 82.60 cm/s MV A velocity: 93.60 cm/s  SHUNTS MV E/A ratio:  0.88        Systemic VTI:  0.20 m                            Systemic Diam: 2.00 cm Mihai Croitoru MD Electronically signed by Luana Rumple MD Signature Date/Time: 06/02/2023/4:00:03 PM    Final         Scheduled Meds:  apixaban  2.5 mg Oral BID   diltiazem  180 mg Oral Daily   fluticasone   2 spray Each Nare Daily   furosemide    40 mg Intravenous Daily   guaiFENesin   1,200 mg Oral BID   ipratropium  0.5 mg Nebulization Q6H   levalbuterol   0.63 mg Nebulization Q6H   levothyroxine   75 mcg Oral Q0600   methylPREDNISolone  (SOLU-MEDROL ) injection  40 mg Intravenous Q12H   metoprolol succinate  25 mg Oral Daily   pantoprazole   40 mg Oral BID   polyethylene glycol  17 g Oral Daily   senna-docusate  2 tablet Oral BID   venlafaxine   50 mg Oral BID   Continuous Infusions:  levofloxacin  (LEVAQUIN ) IV 500 mg (06/02/23 1815)          Audria Leather, MD [facility] 06/03/2023, 9:53 AM

## 2023-06-03 NOTE — Plan of Care (Signed)

## 2023-06-04 ENCOUNTER — Ambulatory Visit: Admitting: Family Medicine

## 2023-06-04 DIAGNOSIS — J189 Pneumonia, unspecified organism: Secondary | ICD-10-CM

## 2023-06-04 DIAGNOSIS — A419 Sepsis, unspecified organism: Secondary | ICD-10-CM | POA: Diagnosis not present

## 2023-06-04 DIAGNOSIS — I4891 Unspecified atrial fibrillation: Secondary | ICD-10-CM | POA: Diagnosis not present

## 2023-06-04 DIAGNOSIS — J9621 Acute and chronic respiratory failure with hypoxia: Secondary | ICD-10-CM | POA: Diagnosis not present

## 2023-06-04 DIAGNOSIS — I471 Supraventricular tachycardia, unspecified: Secondary | ICD-10-CM | POA: Diagnosis not present

## 2023-06-04 DIAGNOSIS — I5031 Acute diastolic (congestive) heart failure: Secondary | ICD-10-CM | POA: Diagnosis not present

## 2023-06-04 LAB — RESPIRATORY PANEL BY PCR

## 2023-06-04 LAB — MAGNESIUM: Magnesium: 2.3 mg/dL (ref 1.7–2.4)

## 2023-06-04 LAB — CULTURE, BLOOD (ROUTINE X 2)
Culture: NO GROWTH
Culture: NO GROWTH
Special Requests: ADEQUATE
Special Requests: ADEQUATE

## 2023-06-04 MED ORDER — ENSURE ENLIVE PO LIQD
237.0000 mL | Freq: Two times a day (BID) | ORAL | Status: DC
  Administered 2023-06-04 – 2023-06-15 (×15): 237 mL via ORAL

## 2023-06-04 MED ORDER — FUROSEMIDE 10 MG/ML IJ SOLN
40.0000 mg | Freq: Two times a day (BID) | INTRAMUSCULAR | Status: DC
  Administered 2023-06-04 – 2023-06-05 (×2): 40 mg via INTRAVENOUS
  Filled 2023-06-04 (×2): qty 4

## 2023-06-04 NOTE — Progress Notes (Signed)
 PROGRESS NOTE    Priscilla Cantrell  WUJ:[id number] DOB: 08-11-1935 DOA: 05/30/2023 PCP: Shelvia Dick, MD   Brief Narrative:  88 y.o. female with medical history significant for CKD stage II, depression, chronic hypoxic respiratory failure due to COPD on 2 L at baseline presented with worsening shortness of breath and cough and was admitted for sepsis due to community-acquired pneumonia along with acute on chronic hypoxic respiratory failure and COPD and CHF exacerbation.  She required BiPAP on presentation.  She has been on IV antibiotics, Solu-Medrol  and Lasix .  Cardiology following.    Assessment & Plan:   Acute on chronic hypoxic respiratory failure Sepsis: Present on admission Community-acquired pneumonia Possible COPD exacerbation Acute on chronic diastolic CHF -Respiratory status has not improved.  Required BiPAP on presentation and is still requiring BiPAP at night and 35 L high flow nasal cannula oxygen  during daytime.  Patient has been on IV Levaquin  along with IV Solu-Medrol  and Lasix .  Continue IV Solu-Medrol .  Continue current nebs.  Pulmonary evaluation appreciated. - Cardiology following.  Diuretics as per cardiology.  Echo showed EF of 60 to 65% with grade 1 diastolic dysfunction.  Continue strict input and output.  Daily weight.  Fluid restriction.  Negative balance of 3289.4 cc since admission.  Paroxysmal A-fib with RVR - Currently rate controlled.  Patient has been started on Eliquis  age appropriate dosing by cardiology.  Continue metoprolol  succinate and Cardizem   Leukocytosis - No labs today.  Monitor  CKD stage II - Creatinine stable.  Monitor  Hypothyroidism -Continue levothyroxine   Hypertension -Monitor blood pressure.  Continue Cardizem , metoprolol  succinate, Lasix .  Amlodipine  on hold currently  GERD -Continue Protonix   Physical deconditioning - Will need PT eval once respiratory status improves   DVT prophylaxis: Eliquis  Code Status:  Full Family Communication: Husband at bedside Disposition Plan: Status is: Inpatient Remains inpatient appropriate because: Of severity of illness  Consultants: Cardiology.  pulmonary  Procedures: 2D echo  Antimicrobials:  Anti-infectives (From admission, onward)    Start     Dose/Rate Route Frequency Ordered Stop   05/31/23 1730  levofloxacin  (LEVAQUIN ) IVPB 500 mg        500 mg 100 mL/hr over 60 Minutes Intravenous Every 24 hours 05/31/23 1631     05/30/23 0900  doxycycline  (VIBRAMYCIN ) 100 mg in sodium chloride  0.9 % 250 mL IVPB  Status:  Discontinued        100 mg 125 mL/hr over 120 Minutes Intravenous Every 12 hours 05/30/23 0848 05/31/23 1631   05/30/23 0045  aztreonam  (AZACTAM ) 2 g in sodium chloride  0.9 % 100 mL IVPB        2 g 200 mL/hr over 30 Minutes Intravenous  Once 05/30/23 0037 05/30/23 0323   05/30/23 0045  metroNIDAZOLE  (FLAGYL ) IVPB 500 mg        500 mg 100 mL/hr over 60 Minutes Intravenous  Once 05/30/23 0037 05/30/23 0323   05/30/23 0045  vancomycin  (VANCOCIN ) IVPB 1000 mg/200 mL premix        1,000 mg 200 mL/hr over 60 Minutes Intravenous  Once 05/30/23 0037 05/30/23 0323        Subjective: Patient seen and examined at bedside.  Continues to feel short of breath with minimal exertion with intermittent cough.  No fever, vomiting, chest pain reported.  Objective: Vitals:   06/03/23 2049 06/04/23 0411 06/04/23 0436 06/04/23 0625  BP: (!) 148/73  (!) 176/98 (!) 158/86  Pulse: (!) 59 80 76 66  Resp: 15 (!) 28 20  Temp: 97.7 F (36.5 C)  98 F (36.7 C)   TempSrc: Oral  Oral   SpO2: 93% 98% 94%   Weight:   54.1 kg   Height:        Intake/Output Summary (Last 24 hours) at 06/04/2023 0753 Last data filed at 06/04/2023 0747 Gross per 24 hour  Intake 340 ml  Output 1675 ml  Net -1335 ml   Filed Weights   05/30/23 1049 06/03/23 0500 06/04/23 0436  Weight: 55.3 kg 53.1 kg 54.1 kg    Examination:  General: On BIPAP.  No distress.  Looks  chronically ill and deconditioned. ENT/neck: No thyromegaly.  JVD is not elevated  respiratory: Decreased breath sounds at bases bilaterally with some crackles; no wheezing with intermittent tachypnea CVS: S1-S2 heard, rate controlled currently Abdominal: Soft, nontender, slightly distended; no organomegaly, bowel sounds are heard Extremities: Trace lower extremity edema; no cyanosis  CNS: Awake and alert.  Slow to respond.  No focal neurologic deficit.  Moves extremities Lymph: No obvious lymphadenopathy Skin: No obvious ecchymosis/lesions  psych: Mostly flat affect.  Not agitated currently.   Musculoskeletal: No obvious joint swelling/deformity   Data Reviewed: I have personally reviewed following labs and imaging studies  CBC: Recent Labs  Lab 05/30/23 0042 05/31/23 0411 06/01/23 0337  WBC 18.8* 19.2* 18.4*  NEUTROABS 16.8*  --   --   HGB 11.2* 10.8* 11.2*  HCT 34.2* 34.3* 36.1  MCV 91.2 92.0 92.3  PLT 269 249 240   Basic Metabolic Panel: Recent Labs  Lab 05/31/23 0411 05/31/23 1650 06/01/23 0837 06/02/23 0348 06/03/23 0416 06/04/23 0419  NA 136 139 142 142 139  --   K 3.8 4.2 3.8 3.5 3.9  --   CL 103 106 101 101 99  --   CO2 24 24 29  32 29  --   GLUCOSE 112* 125* 142* 160* 153*  --   BUN 28* 26* 31* 38* 39*  --   CREATININE 0.70 0.59 0.71 0.83 0.80  --   CALCIUM  8.5* 8.4* 8.8* 8.7* 8.8*  --   MG  --   --   --   --  1.9 2.3   GFR: Estimated Creatinine Clearance: 39.9 mL/min (by C-G formula based on SCr of 0.8 mg/dL). Liver Function Tests: Recent Labs  Lab 05/30/23 0042 05/31/23 1650  AST 19 16  ALT 21 24  ALKPHOS 66 56  BILITOT 1.0 0.8  PROT 6.1* 5.1*  ALBUMIN 3.1* 2.3*   No results for input(s): "LIPASE", "AMYLASE" in the last 168 hours. No results for input(s): "AMMONIA" in the last 168 hours. Coagulation Profile: Recent Labs  Lab 05/30/23 0042  INR 1.1   Cardiac Enzymes: No results for input(s): "CKTOTAL", "CKMB", "CKMBINDEX", "TROPONINI" in  the last 168 hours. BNP (last 3 results) No results for input(s): "PROBNP" in the last 8760 hours. HbA1C: No results for input(s): "HGBA1C" in the last 72 hours. CBG: No results for input(s): "GLUCAP" in the last 168 hours. Lipid Profile: No results for input(s): "CHOL", "HDL", "LDLCALC", "TRIG", "CHOLHDL", "LDLDIRECT" in the last 72 hours. Thyroid  Function Tests: Recent Labs    06/01/23 1502  FREET4 2.15*   Anemia Panel: No results for input(s): "VITAMINB12", "FOLATE", "FERRITIN", "TIBC", "IRON", "RETICCTPCT" in the last 72 hours. Sepsis Labs: Recent Labs  Lab 05/30/23 0113 05/31/23 1026  PROCALCITON  --  2.70  LATICACIDVEN 1.0  --     Recent Results (from the past 240 hours)  Resp panel by RT-PCR (RSV, Flu  A&B, Covid) Anterior Nasal Swab     Status: None   Collection Time: 05/30/23 12:42 AM   Specimen: Anterior Nasal Swab  Result Value Ref Range Status   SARS Coronavirus 2 by RT PCR NEGATIVE NEGATIVE Final    Comment: (NOTE) SARS-CoV-2 target nucleic acids are NOT DETECTED.  The SARS-CoV-2 RNA is generally detectable in upper respiratory specimens during the acute phase of infection. The lowest concentration of SARS-CoV-2 viral copies this assay can detect is 138 copies/mL. A negative result does not preclude SARS-Cov-2 infection and should not be used as the sole basis for treatment or other patient management decisions. A negative result may occur with  improper specimen collection/handling, submission of specimen other than nasopharyngeal swab, presence of viral mutation(s) within the areas targeted by this assay, and inadequate number of viral copies(<138 copies/mL). A negative result must be combined with clinical observations, patient history, and epidemiological information. The expected result is Negative.  Fact Sheet for Patients:  BloggerCourse.com  Fact Sheet for Healthcare Providers:   SeriousBroker.it  This test is no t yet approved or cleared by the [place]  FDA and  has been authorized for detection and/or diagnosis of SARS-CoV-2 by FDA under an Emergency Use Authorization (EUA). This EUA will remain  in effect (meaning this test can be used) for the duration of the COVID-19 declaration under Section 564(b)(1) of the Act, 21 U.S.C.section 360bbb-3(b)(1), unless the authorization is terminated  or revoked sooner.       Influenza A by PCR NEGATIVE NEGATIVE Final   Influenza B by PCR NEGATIVE NEGATIVE Final    Comment: (NOTE) The Xpert Xpress SARS-CoV-2/FLU/RSV plus assay is intended as an aid in the diagnosis of influenza from Nasopharyngeal swab specimens and should not be used as a sole basis for treatment. Nasal washings and aspirates are unacceptable for Xpert Xpress SARS-CoV-2/FLU/RSV testing.  Fact Sheet for Patients: BloggerCourse.com  Fact Sheet for Healthcare Providers: SeriousBroker.it  This test is not yet approved or cleared by the [place]  FDA and has been authorized for detection and/or diagnosis of SARS-CoV-2 by FDA under an Emergency Use Authorization (EUA). This EUA will remain in effect (meaning this test can be used) for the duration of the COVID-19 declaration under Section 564(b)(1) of the Act, 21 U.S.C. section 360bbb-3(b)(1), unless the authorization is terminated or revoked.     Resp Syncytial Virus by PCR NEGATIVE NEGATIVE Final    Comment: (NOTE) Fact Sheet for Patients: BloggerCourse.com  Fact Sheet for Healthcare Providers: SeriousBroker.it  This test is not yet approved or cleared by the [place]  FDA and has been authorized for detection and/or diagnosis of SARS-CoV-2 by FDA under an Emergency Use Authorization (EUA). This EUA will remain in effect (meaning this test can be used) for  the duration of the COVID-19 declaration under Section 564(b)(1) of the Act, 21 U.S.C. section 360bbb-3(b)(1), unless the authorization is terminated or revoked.  Performed at [facility], [address] [place], [place] [address]   Urine culture     Status: Abnormal   Collection Time: 05/30/23 12:42 AM   Specimen: Urine, Clean Catch  Result Value Ref Range Status   Specimen Description   Final    URINE, CLEAN CATCH Performed at [facility], [address], [place], [place] [address]    Special Requests   Final    NONE Performed at [facility], [address] [place], [place] [address]    Culture (A)  Final    <  10,000 COLONIES/mL INSIGNIFICANT GROWTH Performed at [facility], [address], [place], [place] [address]    Report Status 05/31/2023 FINAL  Final  Blood Culture (routine x 2)     Status: None   Collection Time: 05/30/23  1:22 AM   Specimen: BLOOD RIGHT FOREARM  Result Value Ref Range Status   Specimen Description   Final    BLOOD RIGHT FOREARM Performed at [facility], [address], [place], [place] [address]    Special Requests   Final    BOTTLES DRAWN AEROBIC AND ANAEROBIC Blood Culture adequate volume Performed at [facility], [address] [place], [place] [address]    Culture   Final    NO GROWTH 5 DAYS Performed at [facility], [address] [place], [place] [address]    Report Status 06/04/2023 FINAL  Final  Blood Culture (routine x 2)     Status: None   Collection Time: 05/30/23  1:32 AM   Specimen: BLOOD RIGHT HAND  Result Value Ref Range Status   Specimen Description   Final    BLOOD RIGHT HAND Performed at [facility], [address] [place], [place] [address]    Special Requests   Final    BOTTLES DRAWN AEROBIC AND ANAEROBIC Blood Culture adequate volume Performed at [facility], [address] [place], [place] [address]    Culture   Final    NO GROWTH 5 DAYS Performed at [facility], [address], [place], [place] [address]    Report Status 06/04/2023 FINAL  Final         Radiology Studies: DG CHEST PORT 1 VIEW Result Date: 06/03/2023 CLINICAL DATA:  Dyspnea EXAM: PORTABLE CHEST 1 VIEW COMPARISON:  05/30/2023 FINDINGS: Cardiac shadow is stable. Aortic calcifications are noted. Increasing basilar airspace opacity and small effusions are noted when compared with the prior exam. No acute bony abnormality is noted. IMPRESSION: Increasing bibasilar airspace opacities with associated effusions. Electronically Signed   By: Violeta Grey M.D.   On: 06/03/2023 11:01   ECHOCARDIOGRAM COMPLETE Result Date: 06/02/2023    ECHOCARDIOGRAM REPORT   Patient Name:   JAYLIA JACOBER Date of Exam: 06/02/2023 Medical Rec #:  [id number]        Height:       62.0 in Accession #:    [id number]       Weight:       121.9 lb Date of Birth:  April 10, 1935         BSA:          1.549 m Patient Age:    88 years         BP:           147/80 mmHg Patient Gender: F                HR:           76 bpm. Exam Location:  Inpatient Procedure: 2D Echo, Color Doppler and Cardiac Doppler (Both Spectral and Color            Flow Doppler were utilized during procedure). Indications:    I50.31 Acute diastolic (congestive) heart failure  History:        Patient has prior history of Echocardiogram examinations, most                 recent 06/21/2021. Signs/Symptoms:Shortness of Breath; Risk  Factors:Hypertension.  Sonographer:    Andrena Bang Referring Phys: [phone] TRACI R TURNER  Sonographer Comments: Pt sitting up. SOB and coughing throughout exam. IMPRESSIONS  1. Left ventricular ejection fraction, by estimation, is 60 to 65%. The left ventricle has normal function. The left ventricle has no regional wall motion abnormalities. Left ventricular diastolic parameters are consistent with Grade I diastolic dysfunction  (impaired relaxation). Elevated left atrial pressure.  2. Right ventricular systolic function is normal. The right ventricular size is moderately enlarged. There is severely elevated pulmonary artery systolic pressure.  3. Right atrial size was moderately dilated.  4. The mitral valve is normal in structure. No evidence of mitral valve regurgitation.  5. Tricuspid valve regurgitation is moderate to severe.  6. The aortic valve is tricuspid. Aortic valve regurgitation is not visualized. No aortic stenosis is present.  7. There is borderline dilatation of the ascending aorta, measuring 38 mm.  8. The inferior vena cava is dilated in size with <50% respiratory variability, suggesting right atrial pressure of 15 mmHg. Comparison(s): Prior images reviewed side by side. There is interval dilation of the right ventricle, worsened tricuspid insufficiency and higher systolic pulmonary artery pressure. FINDINGS  Left Ventricle: Left ventricular ejection fraction, by estimation, is 60 to 65%. The left ventricle has normal function. The left ventricle has no regional wall motion abnormalities. The left ventricular internal cavity size was normal in size. There is  no left ventricular hypertrophy. Left ventricular diastolic parameters are consistent with Grade I diastolic dysfunction (impaired relaxation). Elevated left atrial pressure. Right Ventricle: The right ventricular size is moderately enlarged. Right vetricular wall thickness was not well visualized. Right ventricular systolic function is normal. There is severely elevated pulmonary artery systolic pressure. The tricuspid regurgitant velocity is 3.49 m/s, and with an assumed right atrial pressure of 15 mmHg, the estimated right ventricular systolic pressure is 63.7 mmHg. Left Atrium: Left atrial size was normal in size. Right Atrium: Right atrial size was moderately dilated. Pericardium: There is no evidence of pericardial effusion. Mitral Valve: The mitral valve is  normal in structure. No evidence of mitral valve regurgitation. Tricuspid Valve: The tricuspid valve is normal in structure. Tricuspid valve regurgitation is moderate to severe. Aortic Valve: The aortic valve is tricuspid. Aortic valve regurgitation is not visualized. No aortic stenosis is present. Aortic valve mean gradient measures 4.0 mmHg. Aortic valve peak gradient measures 8.6 mmHg. Aortic valve area, by VTI measures 2.12 cm. Pulmonic Valve: The pulmonic valve was grossly normal. Pulmonic valve regurgitation is trivial. No evidence of pulmonic stenosis. Aorta: The aortic root is normal in size and structure. There is borderline dilatation of the ascending aorta, measuring 38 mm. Venous: The inferior vena cava is dilated in size with less than 50% respiratory variability, suggesting right atrial pressure of 15 mmHg. IAS/Shunts: The interatrial septum was not well visualized.  LEFT VENTRICLE PLAX 2D LVIDd:         4.30 cm     Diastology LVIDs:         2.90 cm     LV e' medial:    3.70 cm/s LV PW:         0.90 cm     LV E/e' medial:  22.3 LV IVS:        0.70 cm     LV e' lateral:   8.49 cm/s LVOT diam:     2.00 cm     LV E/e' lateral: 9.7 LV SV:  62 LV SV Index:   40 LVOT Area:     3.14 cm  LV Volumes (MOD) LV vol d, MOD A2C: 55.8 ml LV vol d, MOD A4C: 72.3 ml LV vol s, MOD A2C: 22.0 ml LV vol s, MOD A4C: 19.5 ml LV SV MOD A2C:     33.8 ml LV SV MOD A4C:     72.3 ml LV SV MOD BP:      42.9 ml LEFT ATRIUM             Index LA diam:        3.80 cm 2.45 cm/m LA Vol (A2C):   40.3 ml 26.02 ml/m LA Vol (A4C):   22.7 ml 14.65 ml/m LA Biplane Vol: 30.6 ml 19.75 ml/m  AORTIC VALVE AV Area (Vmax):    1.87 cm AV Area (Vmean):   1.82 cm AV Area (VTI):     2.12 cm AV Vmax:           147.00 cm/s AV Vmean:          85.900 cm/s AV VTI:            0.290 m AV Peak Grad:      8.6 mmHg AV Mean Grad:      4.0 mmHg LVOT Vmax:         87.70 cm/s LVOT Vmean:        49.700 cm/s LVOT VTI:          0.196 m LVOT/AV VTI  ratio: 0.68  AORTA Ao Asc diam: 3.80 cm MITRAL VALVE               TRICUSPID VALVE MV Area (PHT): 3.81 cm    TR Peak grad:   48.7 mmHg MV Decel Time: 199 msec    TR Vmax:        349.00 cm/s MV E velocity: 82.60 cm/s MV A velocity: 93.60 cm/s  SHUNTS MV E/A ratio:  0.88        Systemic VTI:  0.20 m                            Systemic Diam: 2.00 cm Mihai Croitoru MD Electronically signed by Luana Rumple MD Signature Date/Time: 06/02/2023/4:00:03 PM    Final         Scheduled Meds:  apixaban   2.5 mg Oral BID   arformoterol   15 mcg Nebulization BID   budesonide  (PULMICORT ) nebulizer solution  0.5 mg Nebulization BID   diltiazem   180 mg Oral Daily   fluticasone   2 spray Each Nare Daily   furosemide   40 mg Intravenous Daily   guaiFENesin   1,200 mg Oral BID   levothyroxine   75 mcg Oral Q0600   methylPREDNISolone  (SOLU-MEDROL ) injection  80 mg Intravenous Q12H   metoprolol  succinate  25 mg Oral Daily   pantoprazole   40 mg Oral BID   polyethylene glycol  17 g Oral Daily   revefenacin   175 mcg Nebulization Daily   senna-docusate  2 tablet Oral BID   sodium chloride  HYPERTONIC  4 mL Nebulization BID   venlafaxine   50 mg Oral BID   Continuous Infusions:  levofloxacin  (LEVAQUIN ) IV Stopped (06/03/23 1757)          Audria Leather, MD [facility] 06/04/2023, 7:53 AM

## 2023-06-04 NOTE — Progress Notes (Signed)
 Removed bipap placed pt back on HHFNC 50L 100%. O2 saturations are 94-97% heart rate 66. Pt is in no distress at this time.RN notified of change.

## 2023-06-04 NOTE — TOC Initial Note (Signed)
 Transition of Care [facility]) - Initial/Assessment Note    Patient Details  Name: Priscilla Cantrell MRN: [id number] Date of Birth: [birth year]  Transition of Care [facility]) CM/SW Contact:    Gertha Ku, LCSW Phone Number: 06/04/2023, 11:18 AM  Clinical Narrative:                 CSW met with pt and pt's POA to discuss recommendations for home health services. Pt reports she is active with [facility]  Pt reports she has a walker ,cane and 3in1. She denies additional DME needs. Pt denies the need for transportation. Confirmed with [facility] pt is active with HHPT/OT will need resumption of care orders. TOC to follow.   Expected Discharge Plan: Home w Home Health Services Barriers to Discharge: Continued Medical Work up   Patient Goals and CMS Choice            Expected Discharge Plan and Services       Living arrangements for the past 2 months: Single Family Home                                      Prior Living Arrangements/Services Living arrangements for the past 2 months: Single Family Home Lives with:: Self, Spouse Patient language and need for interpreter reviewed:: Yes Do you feel safe going back to the place where you live?: Yes      Need for Family Participation in Patient Care: No (Comment) Care giver support system in place?: Yes (comment) Current home services: DME, Home PT Criminal Activity/Legal Involvement Pertinent to Current Situation/Hospitalization: No - Comment as needed  Activities of Daily Living   ADL Screening (condition at time of admission) Independently performs ADLs?: Yes (appropriate for developmental age) Is the patient deaf or have difficulty hearing?: No Does the patient have difficulty seeing, even when wearing glasses/contacts?: No Does the patient have difficulty concentrating, remembering, or making decisions?: Yes  Permission Sought/Granted                  Emotional Assessment Appearance:: Appears stated  age Attitude/Demeanor/Rapport: Gracious Affect (typically observed): Accepting Orientation: : Oriented to  Time, Oriented to Place, Oriented to Situation, Oriented to Self   Psych Involvement: No (comment)  Admission diagnosis:  COPD exacerbation (HCC) [J44.1] Sepsis due to pneumonia (HCC) [J18.9, A41.9] Sepsis, due to unspecified organism, unspecified whether acute organ dysfunction present [facility]) [A41.9] Patient Active Problem List   Diagnosis Date Noted   Acute diastolic CHF (congestive heart failure) (HCC) 06/02/2023   Atrial fibrillation with RVR (HCC) 06/02/2023   SVT (supraventricular tachycardia) (HCC) 06/02/2023   COPD exacerbation (HCC) 06/02/2023   Sepsis due to pneumonia (HCC) 05/30/2023   Hip dislocation, right, initial encounter (HCC) 02/10/2023   Sepsis (HCC) 02/22/2022   Acute on chronic respiratory failure with hypoxia (HCC) 02/21/2022   Influenza A 02/21/2022   Shortness of breath 12/08/2019   Swelling of lower extremity 12/08/2019   Stress due to family tension 12/08/2019   Chronic cystitis with hematuria 08/15/2019   Urinary urgency 08/15/2019   Multiple closed fractures of ribs of right side 10/05/2018   Healthcare maintenance 10/05/2018   Medication management 07/29/2018   Overweight (BMI 25.0-29.9) 12/09/2017   S/P left KA 12/08/2017   S/P left THA, AA 12/08/2017   Postherpetic neuralgia 03/07/2015   Constipation 02/28/2015   Renal mass 02/28/2015   Acute pyelonephritis 02/20/2015   Oral thrush  02/08/2015   Chronic hypoxemic respiratory failure (HCC) 02/02/2015   SOB (shortness of breath)    Essential hypertension 01/24/2015   UTI (urinary tract infection) 01/09/2015   Pain and swelling of right lower leg 01/09/2015   H/O total hip arthroplasty 11/01/2014   Spinal stenosis, lumbar region, with neurogenic claudication 01/04/2014   Osteoarthritis, hip, bilateral 07/27/2013   Sciatica associated with disorder of lumbosacral spine 04/19/2013   Atrophic  vaginitis 01/12/2012   Periodic limb movement disorder 11/24/2011   Hypoxia 05/22/2011   Depression with anxiety 04/15/2011   Chronic venous insufficiency 07/29/2010   Hypothyroidism due to Hashimoto's thyroiditis 01/07/2010   SPINAL STENOSIS 01/07/2010   Allergic rhinitis 08/09/2007   COPD GOLD III B 08/09/2007   PCP:  Shelvia Dick, MD Pharmacy:   [facility] [phone] - [place], [place] - [address] [place] [address] Phone: [phone] Fax: [phone]     Social Drivers of Health (SDOH) Social History: SDOH Screenings   Food Insecurity: No Food Insecurity (05/30/2023)  Housing: Low Risk  (05/30/2023)  Transportation Needs: No Transportation Needs (05/30/2023)  Utilities: Not At Risk (05/30/2023)  Alcohol  Screen: Low Risk  (02/01/2020)  Depression (PHQ2-9): Medium Risk (04/22/2023)  Financial Resource Strain: Low Risk  (03/19/2022)  Physical Activity: Sufficiently Active (03/19/2022)  Social Connections: Socially Integrated (05/30/2023)  Stress: No Stress Concern Present (03/19/2022)  Tobacco Use: Medium Risk (05/30/2023)   SDOH Interventions:     Readmission Risk Interventions     No data to display

## 2023-06-04 NOTE — Progress Notes (Signed)
 Pt placed on Bipap at this time due to low O2 Saturations in the mid to low 80"s while on HHFNC 50L 100%. Pts O2 saturations rose to 98% when placed on BiPAP.  Pt is tolerating well. RN notified of change. Will continue to monitor pt.     06/04/23 0411  BiPAP/CPAP/SIPAP  BiPAP/CPAP/SIPAP Pt Type Adult  BiPAP/CPAP/SIPAP SERVO  Mask Type Full face mask  Dentures removed? Not applicable  Mask Size Small  Set Rate 15 breaths/min  Respiratory Rate 28 breaths/min  IPAP 8 cmH20  PEEP 6 cmH20  FiO2 (%) (S)  80 %  Minute Ventilation 17.4  Leak 31  Peak Inspiratory Pressure (PIP) 18  Tidal Volume (Vt) 618  Patient Home Machine No  Patient Home Mask No  Patient Home Tubing No  Auto Titrate No  Press High Alarm (S)  30 cmH2O  CPAP/SIPAP surface wiped down Yes  Device Plugged into RED Power Outlet Yes  BiPAP/CPAP /SiPAP Vitals  Pulse Rate 80  Resp (!) 28  SpO2 98 %  Bilateral Breath Sounds Diminished  MEWS Score/Color  MEWS Score 2  MEWS Score Color Yellow

## 2023-06-04 NOTE — Progress Notes (Signed)
 NAME:  Priscilla Cantrell, MRN:  [id number], DOB:  [birth year], LOS: 5 ADMISSION DATE:  05/30/2023, CONSULTATION DATE:  06/04/2023  REFERRING MD:  Livia Riffle , CHIEF COMPLAINT: Respiratory distress  History of Present Illness:  88 year old woman with severe COPD and chronic respiratory failure with hypoxia.  At baseline she uses 2 L at rest and 4 L with exertion.  She was admitted with 2 weeks of shortness of breath and cough with green sputum production and hypoxia to 70% on her baseline 2 L oxygen .  Chest x-ray showed bibasilar infiltrates, urine strep antigen was negative.  She was febrile tachycardic with leukocytosis. She was started on Levaquin  treated with BiPAP Course was complicated by new onset atrial fibrillation with RVR and elevated BNP requiring diuretics.  Cardiology was consulted, echo showed normal LVEF with moderate RV enlargement, moderate to severe TR.  Her weight decreased 5 pounds with diuresis but she continued to be more hypoxic and has required HHFNC. Hence PCCM consulted  Pertinent  Medical History  hypothyroidism, HTN, depression,  PFT 10/29/18 >> FEV1 1.05 (68%), FEV1% 52, TLC 4.29 (96%), DLCO 44%   Significant Hospital Events: Including procedures, antibiotic start and stop dates in addition to other pertinent events   4/30 On HHFNC 35 L / 90%  Interim History / Subjective:   Desaturations early morning, FiO2 increased to 100% and transiently needed BiPAP Afebrile Nonproductive cough   Objective   Blood pressure (!) 158/86, pulse 66, temperature 98 F (36.7 C), temperature source Oral, resp. rate 20, height 5\' 2"  (1.575 m), weight 54.1 kg, SpO2 94%.    FiO2 (%):  [80 %-100 %] 100 % PEEP:  [6 cmH20] 6 cmH20   Intake/Output Summary (Last 24 hours) at 06/04/2023 1209 Last data filed at 06/04/2023 0747 Gross per 24 hour  Intake 340 ml  Output 1675 ml  Net -1335 ml   Filed Weights   05/30/23 1049 06/03/23 0500 06/04/23 0436  Weight: 55.3 kg 53.1 kg 54.1 kg     Examination: Gen. Pleasant, elderly, thin woman in no distress, normal affect, , on HHFNC ENT - no pallor,icterus, no post nasal drip Neck: No JVD, no thyromegaly, no carotid bruits Lungs: Decreased breath sounds bilateral, right basal crackles, no accessory muscle use Cardiovascular: S1-S2 regular Abdomen: soft and non-tender, no hepatosplenomegaly, BS normal. Musculoskeletal: No deformities, no cyanosis or clubbing Neuro:  alert, non focal  Labs show normal electrolytes, leukocytosis, BNP decreased from 834-433   Resolved Hospital Problem list     Assessment & Plan:  Acute on chronic hypoxic respiratory failure Bibasilar pneumonia/community-acquired with worsening infiltrates Her S/O was also sick with similar symptoms  -Continue HHFNC , BiPAP as needed -Encourage pulm toilet with flutter valve, chest PT and hypertonic saline nebs - Continue IV Levaquin , check urine Legionella and RVP for completion - Triple therapy nebs  Atrial fibrillation/RVR -rate controlled on Cardizem  and Toprol , cardiology following  Prognosis guarded given high oxygen  requirements but hopeful for recovery  Best Practice (right click and "Reselect all SmartList Selections" daily)    Code Status:  full code Last date of multidisciplinary goals of care discussion [NA]  Labs   CBC: Recent Labs  Lab 05/30/23 0042 05/31/23 0411 06/01/23 0337  WBC 18.8* 19.2* 18.4*  NEUTROABS 16.8*  --   --   HGB 11.2* 10.8* 11.2*  HCT 34.2* 34.3* 36.1  MCV 91.2 92.0 92.3  PLT 269 249 240    Basic Metabolic Panel: Recent Labs  Lab 05/31/23 0411 05/31/23 1650  06/01/23 0837 06/02/23 0348 06/03/23 0416 06/04/23 0419  NA 136 139 142 142 139  --   K 3.8 4.2 3.8 3.5 3.9  --   CL 103 106 101 101 99  --   CO2 24 24 29  32 29  --   GLUCOSE 112* 125* 142* 160* 153*  --   BUN 28* 26* 31* 38* 39*  --   CREATININE 0.70 0.59 0.71 0.83 0.80  --   CALCIUM  8.5* 8.4* 8.8* 8.7* 8.8*  --   MG  --   --   --   --   1.9 2.3   GFR: Estimated Creatinine Clearance: 39.9 mL/min (by C-G formula based on SCr of 0.8 mg/dL). Recent Labs  Lab 05/30/23 0042 05/30/23 0113 05/31/23 0411 05/31/23 1026 06/01/23 0337  PROCALCITON  --   --   --  2.70  --   WBC 18.8*  --  19.2*  --  18.4*  LATICACIDVEN  --  1.0  --   --   --     Liver Function Tests: Recent Labs  Lab 05/30/23 0042 05/31/23 1650  AST 19 16  ALT 21 24  ALKPHOS 66 56  BILITOT 1.0 0.8  PROT 6.1* 5.1*  ALBUMIN 3.1* 2.3*   No results for input(s): "LIPASE", "AMYLASE" in the last 168 hours. No results for input(s): "AMMONIA" in the last 168 hours.  ABG    Component Value Date/Time   PHART 7.4 05/31/2023 0120   PCO2ART 45 05/31/2023 0120   PO2ART 53 (L) 05/31/2023 0120   HCO3 27.8 05/31/2023 0120   TCO2 22.9 01/24/2015 2336   ACIDBASEDEF 2.5 (H) 01/24/2015 2336   O2SAT 91.2 05/31/2023 0120     Coagulation Profile: Recent Labs  Lab 05/30/23 0042  INR 1.1    Cardiac Enzymes: No results for input(s): "CKTOTAL", "CKMB", "CKMBINDEX", "TROPONINI" in the last 168 hours.  HbA1C: Hgb A1c MFr Bld  Date/Time Value Ref Range Status  10/29/2011 03:58 PM 5.7 4.6 - 6.5 % Final    Comment:    Glycemic Control Guidelines for People with Diabetes:Non Diabetic:  <6%Goal of Therapy: <7%Additional Action Suggested:  >8%     CBG: No results for input(s): "GLUCAP" in the last 168 hours.  Celene Coins MD. Hazle Lites. [facility] Pager : [phone]  If no response to pager , please call [phone] until 7 pm After 7:00 pm call Elink  [phone]   06/04/2023

## 2023-06-04 NOTE — Progress Notes (Deleted)
 OFFICE VISIT  06/04/2023  CC: No chief complaint on file.   Patient is a 88 y.o. female who presents for ***  INTERIM HX: ***  Past Medical History:  Diagnosis Date   Allergic rhinitis    with upper airway cough: as of 07/2017 pulm f/u she was instructed to use astelin , flonase , and saline more consistently   BPPV (benign paroxysmal positional vertigo) 03/2018   C. difficile colitis    Chest pain, non-cardiac    Cardiac CT showed no signif obstructive dz, mildly elevated calcium  level (Dr. Audery Blazing).   Chronic cystitis with hematuria    Dr. Lyndol Santee   Chronic hypoxemic respiratory failure (HCC) 02/02/2015   2L oxygen  24/7   Chronic renal insufficiency, stage 2 (mild)    GFR approx 60   Cold hands    NCS/EMGs normal.  Hand dysesthesias per neuro--reassured   COPD (chronic obstructive pulmonary disease) (HCC)    GOLD 4.  spirometry 01/10/09 FEV 0.97(52%), FEV1% 47.  With chronic bronchitis as of 07/2017.   DDD (degenerative disc disease), cervical    1998 MRI C5-6 impingemt (left).  MRI 2021 (neurol) 1 level canal stenosis, 1 level foraminal stenosis--->PT   DDD (degenerative disc disease), lumbar    Depression with anxiety 04/15/2011   Diarrheal disease Summer 2017   ? 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 Dr Andriette Keeling).  As of 10/05/15 pt reports GI dx'd her with C diff and rx'd more flagyl  and she is also on cholestyramine .  As of 02/2016, pt's sx's resolved completely.   Diverticulosis of colon    Procto '97 and colonoscopy 2002   Dysplastic nevus of upper extremity 04/2014   R tricep (Dr. Steen Eden)   Edema of both lower extremities due to peripheral venous insufficiency    Varicosities bilat, swelling L>R.  R baker's cyst.  Got vein clinic eval 02/2018->sclerotherapy recommended but since no hemorrhage or ulceration, insurance will not cover the procedure.   Family history of  adverse reaction to anesthesia    mother died when patient age 77 receiving anesthesia for thyroid  surgery   Fibrocystic breast disease    w/fibroadenoma.  Bx's showed NO atypia (Dr. Linell Rhymes).  Mammo neg 2008.   GERD (gastroesophageal reflux disease)    History of double vision    Ophthalmologist, Dr. Lasandra Points, is further evaluating this with MRI  orbits and limited brain (myesthenia gravis testing neg 07/2010)   History of home oxygen  therapy    uses 2 liters at hs   Hypertension    EKG 03/2010 normal   Hypothyroidism    Hashimoto's, dx'd 1989   Idiopathic peripheral neuropathy 04/2018   Sensory (numb feet) S1 distribution bilat, c/w spinal stenosis sensory neuropathy (no pain). 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  Labs [place], MRI C spine->some spondylosis/stenosis.  UE NCS/EMS: normal 04/2019.   Lesion of right native kidney 2017   found on CT 02/2015.  F/u MRI 03/31/15 showed it to be smaller and less likely of any significance but repeat CT renal protocol in 98mo was recommended by radiology.  This was done 10/26/15 and showed no interval change in the lesion, but b/c the lesion enhances with contrast, radiol rec'd urol consult (b/c pap renal RCC could not be excluded).  Saw urol 03/06/16--stable on u/s 09/2016 and 09/2017.   OAB (overactive bladder)    Osteoarthritis, hip, bilateral    she is s/p bilat THA  as of 02/2018.   Osteoporosis    radius, T score -2.5->rec'd alendronate  08/2019->plan rpt DEXA 08/2021.   Pneumonia    Postmenopausal atrophic vaginitis    improved with estrace 2 X/week.   Prolapse of vaginal cuff after hysterectomy    10/2018: Dr. Zackary Heron. Mild colpocele 04/2019 per GYN (no surgery)   Pulmonary nodule    CT chest 12/10, June 2011.  No nodule on CT 06/2010.   Recurrent Clostridium difficile diarrhea    Episode 09/19/2016: resolved with prolonged course of flagyl .  11/2016 recurrence treated with 10d of Dificid  (Fidaxomicin) by GI (Dr. Andriette Keeling).   Recurrent UTI    likely related to pt's atrophic vaginitis.  Urol started vaginal estrace 03/2016.   Rib fracture 09/2018   R 7th, laterally-->sustained when her dog pulled her over and she fell.   RLS (restless legs syndrome)    neuro->gabapentin  trial 2020/21   Shingles 02/2014   R abd/flank   Spinal stenosis of lumbar region with neurogenic claudication    2008 MRI (L3-4, L4-5, L5-S1).  +S1 distribution sensory loss bilat    Past Surgical History:  Procedure Laterality Date   ABDOMINAL HYSTERECTOMY  1978   no BSO per pt--nonmalignant reasons   APPENDECTOMY     BREAST CYST EXCISION     Last screening mammogram 10/2010 was normal.   CHOLECYSTECTOMY  2001   COLONOSCOPY  04/2005; 12/06/2015   2007 NORMAL.  2017 adenomatous polyp x 1.  Mod sigmoid diverticulosis (Dr. Andriette Keeling).     DEXA  05/18/2017; 08/7019   2019 NORMAL (T-score 0.1).  08/2019->T score -2.5->alendronate  started.  03/2022 T score -2.7   HIP CLOSED REDUCTION Right 12/25/2015   Procedure: CLOSED REDUCTION HIP;  Surgeon: Janeth Medicus, MD;  Location: [facility];  Service: Orthopedics;  Laterality: Right;   NCV/EMS  10/2018   chronic and symmetric sensorimotor axonal polyneuropathy affecting the lower extremities   TONSILLECTOMY     TOTAL HIP ARTHROPLASTY Right 11/01/2014   Procedure: RIGHT TOTAL HIP ARTHROPLASTY;  Surgeon: Hazle Lites, MD;  Location: [facility];  Service: Orthopedics;  Laterality: Right;   TOTAL HIP ARTHROPLASTY Left 12/08/2017   Procedure: LEFT TOTAL HIP ARTHROPLASTY ANTERIOR APPROACH;  Surgeon: Claiborne Crew, MD;  Location: [facility];  Service: Orthopedics;  Laterality: Left;  70 mins   TOTAL HIP REVISION Right 02/10/2023   Procedure: RIGHT TOTAL HIP REVISION;  Surgeon: Claiborne Crew, MD;  Location: [facility];  Service: Orthopedics;  Laterality: Right;   TRANSTHORACIC ECHOCARDIOGRAM  08/2014   2016 EF 60-65%, grade I diast dysfxn. 06/21/21 unchanged.   TUBAL LIGATION  1974     Facility-Administered Medications Prior to Visit  Medication Dose Route Frequency Provider Last Rate Last Admin   acetaminophen  (TYLENOL ) tablet 650 mg  650 mg Oral Q6H PRN Jannette Mend, Mir M, MD   650 mg at 06/04/23 0343   Or   acetaminophen  (TYLENOL ) suppository 650 mg  650 mg Rectal Q6H PRN Gaylin Ke, MD       apixaban  (ELIQUIS ) tablet 2.5 mg  2.5 mg Oral BID Delpha Fickle, RPH   2.5 mg at 06/03/23 2010   arformoterol  (BROVANA ) nebulizer solution 15 mcg  15 mcg Nebulization BID Alva, Rakesh V, MD   15 mcg at 06/03/23 2011   budesonide  (PULMICORT ) nebulizer solution 0.5 mg  0.5 mg Nebulization BID Audria Leather, MD   0.5 mg at 06/03/23 2011   diltiazem  (CARDIZEM  CD) 24 hr capsule 180 mg  180 mg Oral Daily Turner, Traci  R, MD   180 mg at 06/03/23 1103   fluticasone  (FLONASE ) 50 MCG/ACT nasal spray 2 spray  2 spray Each Nare Daily Jannette Mend, Mir M, MD   2 spray at 06/03/23 1104   furosemide  (LASIX ) injection 40 mg  40 mg Intravenous Daily Zhao, Xika, NP   40 mg at 06/03/23 1102   guaiFENesin  (MUCINEX ) 12 hr tablet 1,200 mg  1,200 mg Oral BID Lama, Gagan S, MD   1,200 mg at 06/03/23 2010   levalbuterol  (XOPENEX ) nebulizer solution 0.63 mg  0.63 mg Nebulization Q6H PRN Lind Repine, MD       levofloxacin  (LEVAQUIN ) IVPB 500 mg  500 mg Intravenous Q24H Ozell Blunt, MD 100 mL/hr at 06/03/23 1653 500 mg at 06/03/23 1653   levothyroxine  (SYNTHROID ) tablet 75 mcg  75 mcg Oral Q0600 Lama, Gagan S, MD   75 mcg at 06/04/23 0544   methylPREDNISolone  sodium succinate (SOLU-MEDROL ) 125 mg/2 mL injection 80 mg  80 mg Intravenous Q12H Audria Leather, MD   80 mg at 06/03/23 2302   metoprolol  succinate (TOPROL -XL) 24 hr tablet 25 mg  25 mg Oral Daily Turner, Traci R, MD   25 mg at 06/03/23 1103   ondansetron  (ZOFRAN ) tablet 4 mg  4 mg Oral Q6H PRN Gaylin Ke, MD       Or   ondansetron  (ZOFRAN ) injection 4 mg  4 mg Intravenous Q6H PRN Jannette Mend, Mir M, MD       pantoprazole  (PROTONIX )  EC tablet 40 mg  40 mg Oral BID Ikramullah, Mir M, MD   40 mg at 06/03/23 2010   polyethylene glycol (MIRALAX  / GLYCOLAX ) packet 17 g  17 g Oral Daily Kyere, Belinda K, NP   17 g at 05/31/23 0981   revefenacin  (YUPELRI ) nebulizer solution 175 mcg  175 mcg Nebulization Daily Alva, Rakesh V, MD   175 mcg at 06/03/23 2011   senna-docusate (Senokot-S) tablet 2 tablet  2 tablet Oral BID Kyere, Belinda K, NP   2 tablet at 06/03/23 2010   sodium chloride  HYPERTONIC 3 % nebulizer solution 4 mL  4 mL Nebulization BID Lind Repine, MD   4 mL at 06/03/23 2011   venlafaxine  (EFFEXOR ) tablet 50 mg  50 mg Oral BID Ikramullah, Mir M, MD   50 mg at 06/03/23 2118   Outpatient Medications Prior to Visit  Medication Sig Dispense Refill   acetaminophen  (TYLENOL ) 325 MG tablet Take 2 tablets (650 mg total) by mouth every 6 (six) hours. (Patient taking differently: Take 650 mg by mouth every 6 (six) hours as needed for moderate pain (pain score 4-6) or fever.)     albuterol  (PROAIR  HFA) 108 (90 Base) MCG/ACT inhaler Inhale 2 puffs into the lungs every 4 (four) hours as needed for wheezing or shortness of breath. 17 each 1   albuterol  (PROVENTIL ) (2.5 MG/3ML) 0.083% nebulizer solution Take 2.5 mg by nebulization every 6 (six) hours as needed for wheezing or shortness of breath.     amLODipine  (NORVASC ) 10 MG tablet Take 1 tablet (10 mg total) by mouth daily. 30 tablet 1   azithromycin  (ZITHROMAX ) 250 MG tablet 2 tabs po qd x 1d, then 1 tab po qd x 4d 6 tablet 0   fluticasone  (FLONASE ) 50 MCG/ACT nasal spray Place 2 sprays into both nostrils daily. 16 g 6   Fluticasone -Umeclidin-Vilant (TRELEGY ELLIPTA ) 100-62.5-25 MCG/ACT AEPB Inhale 1 puff into the lungs daily. 180 each 3   levothyroxine  (SYNTHROID ) 100 MCG tablet TAKE  1 TABLET BY MOUTH EVERY DAY 90 tablet 0   lidocaine  (XYLOCAINE ) 2 % solution Apply 1/2 ml to affected area of mouth 3 times per day as needed for pain 20 mL 2   Misc. Devices (PULSE OXIMETER) MISC Check  oxygen  level as needed 1 each 1   pantoprazole  (PROTONIX ) 40 MG tablet Take 1 tablet (40 mg total) by mouth 2 (two) times daily. 180 tablet 1   predniSONE  (DELTASONE ) 10 MG tablet 4 tabs po every day x 4d, then 3 tabs po every day x 4d, then 2 tabs po every day x 4d, then 1 tab po every day x 4d 40 tablet 0   venlafaxine  (EFFEXOR ) 50 MG tablet Take 50 mg by mouth 2 (two) times daily.      Allergies  Allergen Reactions   Losartan  Other (See Comments)    hyperkalemia   Penicillins Hives, Swelling and Rash    Has patient had a PCN reaction causing immediate rash, facial/tongue/throat swelling, SOB or lightheadedness with hypotension: YES Has patient had a PCN reaction causing severe rash involving mucus membranes or skin necrosis: NO Has patient had a PCN reaction that required hospitalization NO Has patient had a PCN reaction occurring within the last 10 years: NO If all of the above answers are "NO", then may proceed with Cephalosporin use.    Cefixime [Kdc:Cefixime] Other (See Comments)    unspecified   Gabapentin  Other (See Comments)    Feels woozy   Indocin [Indomethacin] Other (See Comments)    Painful tongue and throat   Singulair  [Montelukast  Sodium]     Chest tightness, decreased mental clarity   Sulfa Antibiotics Other (See Comments)    unspecified   Ace Inhibitors Other (See Comments)    cough   Statins Other (See Comments)    Myalgias     Review of Systems As per HPI  PE:    06/04/2023    6:25 AM 06/04/2023    4:36 AM 06/04/2023    4:11 AM  Vitals with BMI  Weight  119 lbs 4 oz   BMI  21.81   Systolic 158 176   Diastolic 86 98   Pulse 66 76 80     Physical Exam  ***  LABS:  {Labs (Optional):23779}  IMPRESSION AND PLAN:  No problem-specific Assessment & Plan notes found for this encounter.   An After Visit Summary was printed and given to the patient.  FOLLOW UP: No follow-ups on file.  @esig @

## 2023-06-04 NOTE — Plan of Care (Signed)
   Problem: Education: Goal: Knowledge of General Education information will improve Description: Including pain rating scale, medication(s)/side effects and non-pharmacologic comfort measures Outcome: Progressing   Problem: Health Behavior/Discharge Planning: Goal: Ability to manage health-related needs will improve Outcome: Progressing   Problem: Clinical Measurements: Goal: Respiratory complications will improve Outcome: Progressing   Problem: Nutrition: Goal: Adequate nutrition will be maintained Outcome: Progressing   Problem: Coping: Goal: Level of anxiety will decrease Outcome: Progressing

## 2023-06-04 NOTE — Plan of Care (Signed)

## 2023-06-04 NOTE — Progress Notes (Addendum)
 Progress Note  Patient Name: ADALAE KAAI Date of Encounter: 06/04/2023  [facility] Cardiologist: None   Patient Profile     Subjective   on BiPAP overnight but now back on HHFNC 50L.  Telemetry shows normal sinus rhythm with no further atrial arrhythmias Inpatient Medications    Scheduled Meds:  apixaban   2.5 mg Oral BID   arformoterol   15 mcg Nebulization BID   budesonide  (PULMICORT ) nebulizer solution  0.5 mg Nebulization BID   diltiazem   180 mg Oral Daily   feeding supplement  237 mL Oral BID BM   fluticasone   2 spray Each Nare Daily   furosemide   40 mg Intravenous Daily   guaiFENesin   1,200 mg Oral BID   levothyroxine   75 mcg Oral Q0600   methylPREDNISolone  (SOLU-MEDROL ) injection  80 mg Intravenous Q12H   metoprolol  succinate  25 mg Oral Daily   pantoprazole   40 mg Oral BID   polyethylene glycol  17 g Oral Daily   revefenacin   175 mcg Nebulization Daily   senna-docusate  2 tablet Oral BID   sodium chloride  HYPERTONIC  4 mL Nebulization BID   venlafaxine   50 mg Oral BID   Continuous Infusions:  levofloxacin  (LEVAQUIN ) IV Stopped (06/03/23 1757)   PRN Meds: acetaminophen  **OR** acetaminophen , levalbuterol , ondansetron  **OR** ondansetron  (ZOFRAN ) IV   Vital Signs    Vitals:   06/03/23 2049 06/04/23 0411 06/04/23 0436 06/04/23 0625  BP: (!) 148/73  (!) 176/98 (!) 158/86  Pulse: (!) 59 80 76 66  Resp: 15 (!) 28 20   Temp: 97.7 F (36.5 C)  98 F (36.7 C)   TempSrc: Oral  Oral   SpO2: 93% 98% 94%   Weight:   54.1 kg   Height:        Intake/Output Summary (Last 24 hours) at 06/04/2023 1102 Last data filed at 06/04/2023 0747 Gross per 24 hour  Intake 340 ml  Output 1675 ml  Net -1335 ml      06/04/2023    4:36 AM 06/03/2023    5:00 AM 05/30/2023   10:49 AM  Last 3 Weights  Weight (lbs) 119 lb 4.3 oz 117 lb 1 oz 121 lb 14.6 oz  Weight (kg) 54.1 kg 53.1 kg 55.3 kg      Telemetry    Normal sinus rhythm personally Reviewed  ECG    No new  EKG to review- Personally Reviewed  Physical Exam   GEN: Well nourished, well developed in no acute distress HEENT: Normal NECK: No JVD; No carotid bruits LYMPHATICS: No lymphadenopathy CARDIAC:RRR, no murmurs, rubs, gallops RESPIRATORY:  crackles at bases with decreased BS ABDOMEN: Soft, non-tender, non-distended MUSCULOSKELETAL:  No edema; No deformity  SKIN: Warm and dry NEUROLOGIC:  Alert and oriented x 3 PSYCHIATRIC:  Normal affect  Labs    High Sensitivity Troponin:  No results for input(s): "TROPONINIHS" in the last 720 hours.    Chemistry Recent Labs  Lab 05/30/23 0042 05/31/23 0411 05/31/23 1650 06/01/23 0837 06/02/23 0348 06/03/23 0416  NA 136   < > 139 142 142 139  K 3.7   < > 4.2 3.8 3.5 3.9  CL 99   < > 106 101 101 99  CO2 24   < > 24 29 32 29  GLUCOSE 153*   < > 125* 142* 160* 153*  BUN 24*   < > 26* 31* 38* 39*  CREATININE 0.94   < > 0.59 0.71 0.83 0.80  CALCIUM  8.5*   < >  8.4* 8.8* 8.7* 8.8*  PROT 6.1*  --  5.1*  --   --   --   ALBUMIN 3.1*  --  2.3*  --   --   --   AST 19  --  16  --   --   --   ALT 21  --  24  --   --   --   ALKPHOS 66  --  56  --   --   --   BILITOT 1.0  --  0.8  --   --   --   GFRNONAA 59*   < > >60 >60 >60 >60  ANIONGAP 13   < > 9 12 9 11    < > = values in this interval not displayed.     Hematology Recent Labs  Lab 05/30/23 0042 05/31/23 0411 06/01/23 0337  WBC 18.8* 19.2* 18.4*  RBC 3.75* 3.73* 3.91  HGB 11.2* 10.8* 11.2*  HCT 34.2* 34.3* 36.1  MCV 91.2 92.0 92.3  MCH 29.9 29.0 28.6  MCHC 32.7 31.5 31.0  RDW 13.1 13.1 13.0  PLT 269 249 240    BNP Recent Labs  Lab 05/31/23 1026 06/03/23 0416  BNP 834.8* 433.8*     DDimer No results for input(s): "DDIMER" in the last 168 hours.   CHA2DS2-VASc Score = 4   This indicates a 4.8% annual risk of stroke. The patient's score is based upon: CHF History: 0 HTN History: 1 Diabetes History: 0 Stroke History: 0 Vascular Disease History: 0 Age Score: 2 Gender  Score: 1   Radiology    DG CHEST PORT 1 VIEW Result Date: 06/03/2023 CLINICAL DATA:  Dyspnea EXAM: PORTABLE CHEST 1 VIEW COMPARISON:  05/30/2023 FINDINGS: Cardiac shadow is stable. Aortic calcifications are noted. Increasing basilar airspace opacity and small effusions are noted when compared with the prior exam. No acute bony abnormality is noted. IMPRESSION: Increasing bibasilar airspace opacities with associated effusions. Electronically Signed   By: Violeta Grey M.D.   On: 06/03/2023 11:01   ECHOCARDIOGRAM COMPLETE Result Date: 06/02/2023    ECHOCARDIOGRAM REPORT   Patient Name:   AYNARA URUETA Date of Exam: 06/02/2023 Medical Rec #:  [id number]        Height:       62.0 in Accession #:    [id number]       Weight:       121.9 lb Date of Birth:  19-Nov-1935         BSA:          1.549 m Patient Age:    88 years         BP:           147/80 mmHg Patient Gender: F                HR:           76 bpm. Exam Location:  Inpatient Procedure: 2D Echo, Color Doppler and Cardiac Doppler (Both Spectral and Color            Flow Doppler were utilized during procedure). Indications:    I50.31 Acute diastolic (congestive) heart failure  History:        Patient has prior history of Echocardiogram examinations, most                 recent 06/21/2021. Signs/Symptoms:Shortness of Breath; Risk                 Factors:Hypertension.  Sonographer:  Andrena Bang Referring Phys: [phone] Trinitee Horgan R Waylyn Tenbrink  Sonographer Comments: Pt sitting up. SOB and coughing throughout exam. IMPRESSIONS  1. Left ventricular ejection fraction, by estimation, is 60 to 65%. The left ventricle has normal function. The left ventricle has no regional wall motion abnormalities. Left ventricular diastolic parameters are consistent with Grade I diastolic dysfunction (impaired relaxation). Elevated left atrial pressure.  2. Right ventricular systolic function is normal. The right ventricular size is moderately enlarged. There is severely elevated pulmonary artery  systolic pressure.  3. Right atrial size was moderately dilated.  4. The mitral valve is normal in structure. No evidence of mitral valve regurgitation.  5. Tricuspid valve regurgitation is moderate to severe.  6. The aortic valve is tricuspid. Aortic valve regurgitation is not visualized. No aortic stenosis is present.  7. There is borderline dilatation of the ascending aorta, measuring 38 mm.  8. The inferior vena cava is dilated in size with <50% respiratory variability, suggesting right atrial pressure of 15 mmHg. Comparison(s): Prior images reviewed side by side. There is interval dilation of the right ventricle, worsened tricuspid insufficiency and higher systolic pulmonary artery pressure. FINDINGS  Left Ventricle: Left ventricular ejection fraction, by estimation, is 60 to 65%. The left ventricle has normal function. The left ventricle has no regional wall motion abnormalities. The left ventricular internal cavity size was normal in size. There is  no left ventricular hypertrophy. Left ventricular diastolic parameters are consistent with Grade I diastolic dysfunction (impaired relaxation). Elevated left atrial pressure. Right Ventricle: The right ventricular size is moderately enlarged. Right vetricular wall thickness was not well visualized. Right ventricular systolic function is normal. There is severely elevated pulmonary artery systolic pressure. The tricuspid regurgitant velocity is 3.49 m/s, and with an assumed right atrial pressure of 15 mmHg, the estimated right ventricular systolic pressure is 63.7 mmHg. Left Atrium: Left atrial size was normal in size. Right Atrium: Right atrial size was moderately dilated. Pericardium: There is no evidence of pericardial effusion. Mitral Valve: The mitral valve is normal in structure. No evidence of mitral valve regurgitation. Tricuspid Valve: The tricuspid valve is normal in structure. Tricuspid valve regurgitation is moderate to severe. Aortic Valve: The aortic  valve is tricuspid. Aortic valve regurgitation is not visualized. No aortic stenosis is present. Aortic valve mean gradient measures 4.0 mmHg. Aortic valve peak gradient measures 8.6 mmHg. Aortic valve area, by VTI measures 2.12 cm. Pulmonic Valve: The pulmonic valve was grossly normal. Pulmonic valve regurgitation is trivial. No evidence of pulmonic stenosis. Aorta: The aortic root is normal in size and structure. There is borderline dilatation of the ascending aorta, measuring 38 mm. Venous: The inferior vena cava is dilated in size with less than 50% respiratory variability, suggesting right atrial pressure of 15 mmHg. IAS/Shunts: The interatrial septum was not well visualized.  LEFT VENTRICLE PLAX 2D LVIDd:         4.30 cm     Diastology LVIDs:         2.90 cm     LV e' medial:    3.70 cm/s LV PW:         0.90 cm     LV E/e' medial:  22.3 LV IVS:        0.70 cm     LV e' lateral:   8.49 cm/s LVOT diam:     2.00 cm     LV E/e' lateral: 9.7 LV SV:         62 LV SV Index:  40 LVOT Area:     3.14 cm  LV Volumes (MOD) LV vol d, MOD A2C: 55.8 ml LV vol d, MOD A4C: 72.3 ml LV vol s, MOD A2C: 22.0 ml LV vol s, MOD A4C: 19.5 ml LV SV MOD A2C:     33.8 ml LV SV MOD A4C:     72.3 ml LV SV MOD BP:      42.9 ml LEFT ATRIUM             Index LA diam:        3.80 cm 2.45 cm/m LA Vol (A2C):   40.3 ml 26.02 ml/m LA Vol (A4C):   22.7 ml 14.65 ml/m LA Biplane Vol: 30.6 ml 19.75 ml/m  AORTIC VALVE AV Area (Vmax):    1.87 cm AV Area (Vmean):   1.82 cm AV Area (VTI):     2.12 cm AV Vmax:           147.00 cm/s AV Vmean:          85.900 cm/s AV VTI:            0.290 m AV Peak Grad:      8.6 mmHg AV Mean Grad:      4.0 mmHg LVOT Vmax:         87.70 cm/s LVOT Vmean:        49.700 cm/s LVOT VTI:          0.196 m LVOT/AV VTI ratio: 0.68  AORTA Ao Asc diam: 3.80 cm MITRAL VALVE               TRICUSPID VALVE MV Area (PHT): 3.81 cm    TR Peak grad:   48.7 mmHg MV Decel Time: 199 msec    TR Vmax:        349.00 cm/s MV E velocity:  82.60 cm/s MV A velocity: 93.60 cm/s  SHUNTS MV E/A ratio:  0.88        Systemic VTI:  0.20 m                            Systemic Diam: 2.00 cm Karyl Paget Croitoru MD Electronically signed by Luana Rumple MD Signature Date/Time: 06/02/2023/4:00:03 PM    Final     Patient Profile     88 y.o. female with a hx of oxygen  dependent COPD with 2LNC, hypothyroidism, HTN, depression,  who is being seen 06/01/2023 for the evaluation of CHF at the request of Dr Alfonse Angle.   Assessment & Plan    Acute on Chronic Hypoxemic Respiratory Failure COPD exacerbation/CAP ?Acute diastolic CHF Symptoms most consistent with COPD exacerbation due to CAP (patient had fever of 102 with bilateral lower lobe airspace opacities and leukocytosis) Currently on IV antibiotics, Mucomyst , Xopenex  and Atrovent  nebs as well as IV Solu-Medrol  Currently on Lasix  40mg  IV daily weight down 3lbs from admission She put out 1.6 L yesterday and net -3.2 L since admission being BMET pending today>> mag 2.3 2D echo EF 60 to 65% with G1 DD and elevated LAP, moderate RV enlargement with normal RV systolic function, moderate RAE, moderate to severe TR and RAP estimated at 15 mmHg Suspect she does have a component of acute diastolic CHF likely triggered by her COPD exacerbation and pneumonia On BiPAP at night but currently on HFNC at 50 L  BNP has decreased from 834->>433 Chest x-ray yesterday with increasing bibasilar airspace opacities and effusions  Echo showed RAP estimated at  15 mmHg consistent with persistent volume overload  Remains volume overloaded on exam Increase Lasix  to 40 mg IV twice daily Strict I's and O's, daily weights and renal function while diuresing Replete potassium to keep >4 Replete mag to keep >2   New onset atrial fibrillation with RVR Paroxysmal SVT She has no history of atrial fibrillation in the past normal sinus rhythm on admission but EKG 4/27 showed atrial fibrillation Telemetry has shown normal sinus rhythm  with frequent PVCs and short bursts of SVT that are consistent with nonsustained atrial tachycardia.  With improvement in O2 saturations arrhythmias have resolved and now normal sinus rhythm on telemetry TSH minimally reduced at 0.313>> T4 significantly elevated at 2.15 which may be driving her A-fib [id number] score is 7  (hypertension, CHF, hypertension, CAD, age> 46, female) She is at significant increased risk for cardioembolic events and has been started on Eliquis  2.5 mg twice daily (dose reduced due to age >28 and weight <60 kg) Continue Cardizem  CD 180 mg daily and Toprol  XL 25 mg daily for suppression of SVT    I spent 35 minutes caring for this patient today face to face, ordering and reviewing labs, reviewing records from 2D echo  2023, Cxray this admit, seeing the patient, documenting in the record and arranging for repeat echo and chest xray  For questions or updates, please contact [facility] Please consult [email or web address] for contact info under        Signed, Gaylyn Keas, MD  06/04/2023, 11:02 AM

## 2023-06-05 ENCOUNTER — Inpatient Hospital Stay (HOSPITAL_COMMUNITY)

## 2023-06-05 DIAGNOSIS — J129 Viral pneumonia, unspecified: Secondary | ICD-10-CM | POA: Diagnosis not present

## 2023-06-05 DIAGNOSIS — J9621 Acute and chronic respiratory failure with hypoxia: Secondary | ICD-10-CM | POA: Diagnosis not present

## 2023-06-05 DIAGNOSIS — A419 Sepsis, unspecified organism: Secondary | ICD-10-CM | POA: Diagnosis not present

## 2023-06-05 DIAGNOSIS — I471 Supraventricular tachycardia, unspecified: Secondary | ICD-10-CM | POA: Diagnosis not present

## 2023-06-05 DIAGNOSIS — J189 Pneumonia, unspecified organism: Secondary | ICD-10-CM | POA: Diagnosis not present

## 2023-06-05 DIAGNOSIS — B348 Other viral infections of unspecified site: Secondary | ICD-10-CM

## 2023-06-05 DIAGNOSIS — I4891 Unspecified atrial fibrillation: Secondary | ICD-10-CM | POA: Diagnosis not present

## 2023-06-05 DIAGNOSIS — I5031 Acute diastolic (congestive) heart failure: Secondary | ICD-10-CM | POA: Diagnosis not present

## 2023-06-05 LAB — COMPREHENSIVE METABOLIC PANEL WITH GFR
ALT: 28 U/L (ref 0–44)
AST: 22 U/L (ref 15–41)
Albumin: 2.7 g/dL — ABNORMAL LOW (ref 3.5–5.0)
Alkaline Phosphatase: 64 U/L (ref 38–126)
Anion gap: 12 (ref 5–15)
BUN: 33 mg/dL — ABNORMAL HIGH (ref 8–23)
CO2: 32 mmol/L (ref 22–32)
Calcium: 8.6 mg/dL — ABNORMAL LOW (ref 8.9–10.3)
Chloride: 93 mmol/L — ABNORMAL LOW (ref 98–111)
Creatinine, Ser: 0.82 mg/dL (ref 0.44–1.00)
GFR, Estimated: >60 mL/min (ref >60)
Glucose, Bld: 151 mg/dL — ABNORMAL HIGH (ref 70–99)
Potassium: 3.5 mmol/L (ref 3.5–5.1)
Sodium: 137 mmol/L (ref 135–145)
Total Bilirubin: 1 mg/dL (ref 0.0–1.2)
Total Protein: 5.8 g/dL — ABNORMAL LOW (ref 6.5–8.1)

## 2023-06-05 LAB — CBC WITH DIFFERENTIAL/PLATELET
Abs Immature Granulocytes: 0.6 10*3/uL — ABNORMAL HIGH (ref 0.00–0.07)
Basophils Absolute: 0.1 10*3/uL (ref 0.0–0.1)
Basophils Relative: 1 %
Eosinophils Absolute: 0 10*3/uL (ref 0.0–0.5)
Eosinophils Relative: 0 %
HCT: 38.6 % (ref 36.0–46.0)
Hemoglobin: 12.6 g/dL (ref 12.0–15.0)
Immature Granulocytes: 4 %
Lymphocytes Relative: 3 %
Lymphs Abs: 0.5 10*3/uL — ABNORMAL LOW (ref 0.7–4.0)
MCH: 29.2 pg (ref 26.0–34.0)
MCHC: 32.6 g/dL (ref 30.0–36.0)
MCV: 89.4 fL (ref 80.0–100.0)
Monocytes Absolute: 0.5 10*3/uL (ref 0.1–1.0)
Monocytes Relative: 3 %
Neutro Abs: 14 10*3/uL — ABNORMAL HIGH (ref 1.7–7.7)
Neutrophils Relative %: 89 %
Platelets: 305 10*3/uL (ref 150–400)
RBC: 4.32 MIL/uL (ref 3.87–5.11)
RDW: 12.8 % (ref 11.5–15.5)
WBC: 15.8 10*3/uL — ABNORMAL HIGH (ref 4.0–10.5)
nRBC: 0 % (ref 0.0–0.2)

## 2023-06-05 LAB — MAGNESIUM: Magnesium: 2.2 mg/dL (ref 1.7–2.4)

## 2023-06-05 MED ORDER — FUROSEMIDE 10 MG/ML IJ SOLN
60.0000 mg | Freq: Two times a day (BID) | INTRAMUSCULAR | Status: AC
  Administered 2023-06-05 – 2023-06-07 (×5): 60 mg via INTRAVENOUS
  Filled 2023-06-05 (×5): qty 6

## 2023-06-05 MED ORDER — LEVOFLOXACIN IN D5W 750 MG/150ML IV SOLN
750.0000 mg | INTRAVENOUS | Status: DC
  Administered 2023-06-05: 750 mg via INTRAVENOUS
  Filled 2023-06-05: qty 150

## 2023-06-05 NOTE — Progress Notes (Signed)
 NAME:  Priscilla Cantrell, MRN:  [id number], DOB:  December 28, 1935, LOS: 6 ADMISSION DATE:  05/30/2023, CONSULTATION DATE:  06/05/2023  REFERRING MD:  Livia Riffle , CHIEF COMPLAINT: Respiratory distress  History of Present Illness:  88 year old woman with severe COPD and chronic respiratory failure with hypoxia.  At baseline she uses 2 L at rest and 4 L with exertion.  She was admitted with 2 weeks of shortness of breath and cough with green sputum production and hypoxia to 70% on her baseline 2 L oxygen .  Chest x-ray showed bibasilar infiltrates, urine strep antigen was negative.  She was febrile tachycardic with leukocytosis. She was started on Levaquin  treated with BiPAP Course was complicated by new onset atrial fibrillation with RVR and elevated BNP requiring diuretics.  Cardiology was consulted, echo showed normal LVEF with moderate RV enlargement, moderate to severe TR.  Her weight decreased 5 pounds with diuresis but she continued to be more hypoxic and has required HHFNC. Hence PCCM consulted  Pertinent  Medical History  hypothyroidism, HTN, depression,  PFT 10/29/18 >> FEV1 1.05 (68%), FEV1% 52, TLC 4.29 (96%), DLCO 44%   Significant Hospital Events: Including procedures, antibiotic start and stop dates in addition to other pertinent events   4/30 On HHFNC 35 L / 90%  Interim History / Subjective:  She denies complaints. She remains on HHFNC.  Objective   Blood pressure 135/79, pulse 72, temperature 97.7 F (36.5 C), temperature source Oral, resp. rate (!) 23, height 5\' 2"  (1.575 m), weight 54.1 kg, SpO2 92%.    FiO2 (%):  [80 %-100 %] 100 % PEEP:  [6 cmH20] 6 cmH20 Pressure Support:  [8 cmH20] 8 cmH20   Intake/Output Summary (Last 24 hours) at 06/05/2023 1003 Last data filed at 06/04/2023 2100 Gross per 24 hour  Intake 120 ml  Output 650 ml  Net -530 ml   Filed Weights   05/30/23 1049 06/03/23 0500 06/04/23 0436  Weight: 55.3 kg 53.1 kg 54.1 kg    Examination: Gen: chronically  ill appearing woman sitting up in bed eating breakfast in AND HEENT: [place]/AT, eyes anicteric Lungs: basilar rhales, no tachypnea or accessory muscle use, no conversational dyspnea.  Cardiovascular: S1S2, RRR Abdomen: soft, ND Musculoskeletal: no c/c/e Neuro: awake and alert, moving all extremities, answering questions appropriately.    RVP + parainfluenza  Resolved Hospital Problem list     Assessment & Plan:  Acute on chronic hypoxic respiratory failure due to parainfleunza pneumonia; cannot rule out superimposed bacterial pneumonia as well Bibasilar pneumonia/community-acquired with worsening infiltrates Her S/O was also sick with similar symptoms -con't HHFNC; wean as tolerated -IS, OOB mobility -con't steroids and empiric antibiotics> d/w PharmD changing to pneumonia dosing of levofloxacin  -con't nebs -unfortunately viral pneumonias are often slow to resolve and she will likely have oxygen  requirements for a while, potentially several weeks  Prognosis guarded given high oxygen  requirements but hopeful for recovery. Need to avoid deconditioning as much as possible.   Best Practice (right click and "Reselect all SmartList Selections" daily)    Code Status:  full code Last date of multidisciplinary goals of care discussion [NA]  Labs   CBC: Recent Labs  Lab 05/30/23 0042 05/31/23 0411 06/01/23 0337 06/05/23 0404  WBC 18.8* 19.2* 18.4* 15.8*  NEUTROABS 16.8*  --   --  14.0*  HGB 11.2* 10.8* 11.2* 12.6  HCT 34.2* 34.3* 36.1 38.6  MCV 91.2 92.0 92.3 89.4  PLT 269 249 240 305    Basic Metabolic Panel: Recent Labs  Lab 05/31/23 1650 06/01/23 0837 06/02/23 0348 06/03/23 0416 06/04/23 0419 06/05/23 0404  NA 139 142 142 139  --  137  K 4.2 3.8 3.5 3.9  --  3.5  CL 106 101 101 99  --  93*  CO2 24 29 32 29  --  32  GLUCOSE 125* 142* 160* 153*  --  151*  BUN 26* 31* 38* 39*  --  33*  CREATININE 0.59 0.71 0.83 0.80  --  0.82  CALCIUM  8.4* 8.8* 8.7* 8.8*  --  8.6*   MG  --   --   --  1.9 2.3 2.2   GFR: Estimated Creatinine Clearance: 38.9 mL/min (by C-G formula based on SCr of 0.82 mg/dL). Recent Labs  Lab 05/30/23 0042 05/30/23 0113 05/31/23 0411 05/31/23 1026 06/01/23 0337 06/05/23 0404  PROCALCITON  --   --   --  2.70  --   --   WBC 18.8*  --  19.2*  --  18.4* 15.8*  LATICACIDVEN  --  1.0  --   --   --   --      Joesph Mussel, DO 06/05/23 11:25 AM [facility]  For contact information, see Amion. If no response to pager, please call PCCM consult pager. After hours, 7PM- 7AM, please call Elink.

## 2023-06-05 NOTE — Progress Notes (Signed)
 PROGRESS NOTE    Priscilla Cantrell  WGN:[id number] DOB: 10/06/1935 DOA: 05/30/2023 PCP: Shelvia Dick, MD   Brief Narrative:  88 y.o. female with medical history significant for CKD stage II, depression, chronic hypoxic respiratory failure due to COPD on 2 L at baseline presented with worsening shortness of breath and cough and was admitted for sepsis due to community-acquired pneumonia along with acute on chronic hypoxic respiratory failure and COPD and CHF exacerbation.  She required BiPAP on presentation.  She has been on IV antibiotics, Solu-Medrol  and Lasix .  Cardiology following.  Patient continues to require high flow nasal cannula oxygen  during the day and BiPAP at night.  Pulmonary consulted as well.  Assessment & Plan:   Acute on chronic hypoxic respiratory failure Sepsis: Present on admission Community-acquired pneumonia Possible COPD exacerbation Acute on chronic diastolic CHF -Respiratory status has not improved.  Required BiPAP on presentation and is still requiring BiPAP at night and 50 L high flow nasal cannula oxygen  during daytime.  Patient has been on IV Levaquin  along with IV Solu-Medrol  and Lasix .  Continue IV Solu-Medrol .  Continue current nebs.  Pulmonary following. - Cardiology following.  Diuretics as per cardiology.  Echo showed EF of 60 to 65% with grade 1 diastolic dysfunction.  Continue strict input and output.  Daily weight.  Fluid restriction.  Negative balance of 4219.4 cc since admission. - Palliative care has been consulted for goals of care discussion  Paroxysmal A-fib with RVR - Currently rate controlled.  Patient has been started on Eliquis  age appropriate dosing by cardiology.  Continue metoprolol  succinate and Cardizem   Leukocytosis - Improving.  Monitor  CKD stage II - Creatinine stable.  Monitor  Hypothyroidism -Continue levothyroxine   Hypertension -Monitor blood pressure.  Continue Cardizem , metoprolol  succinate, Lasix .  Amlodipine  on hold  currently  GERD -Continue Protonix   Physical deconditioning - Will need PT eval once respiratory status improves   DVT prophylaxis: Eliquis  Code Status: Full Family Communication: Husband on phone Disposition Plan: Status is: Inpatient Remains inpatient appropriate because: Of severity of illness  Consultants: Cardiology.  pulmonary.  Palliative care  Procedures: 2D echo  Antimicrobials:  Anti-infectives (From admission, onward)    Start     Dose/Rate Route Frequency Ordered Stop   05/31/23 1730  levofloxacin  (LEVAQUIN ) IVPB 500 mg        500 mg 100 mL/hr over 60 Minutes Intravenous Every 24 hours 05/31/23 1631     05/30/23 0900  doxycycline  (VIBRAMYCIN ) 100 mg in sodium chloride  0.9 % 250 mL IVPB  Status:  Discontinued        100 mg 125 mL/hr over 120 Minutes Intravenous Every 12 hours 05/30/23 0848 05/31/23 1631   05/30/23 0045  aztreonam  (AZACTAM ) 2 g in sodium chloride  0.9 % 100 mL IVPB        2 g 200 mL/hr over 30 Minutes Intravenous  Once 05/30/23 0037 05/30/23 0323   05/30/23 0045  metroNIDAZOLE  (FLAGYL ) IVPB 500 mg        500 mg 100 mL/hr over 60 Minutes Intravenous  Once 05/30/23 0037 05/30/23 0323   05/30/23 0045  vancomycin  (VANCOCIN ) IVPB 1000 mg/200 mL premix        1,000 mg 200 mL/hr over 60 Minutes Intravenous  Once 05/30/23 0037 05/30/23 0323        Subjective: Patient seen and examined at bedside.  Still short of breath with even minimal exertion with intermittent cough.  No fever, agitation, vomiting reported. Objective: Vitals:   06/04/23 2205  06/05/23 0329 06/05/23 0400 06/05/23 0500  BP: (!) 137/95   135/79  Pulse: 63 (!) 57 62 72  Resp: 16 (!) 29 (!) 23 (!) 23  Temp: 98.4 F (36.9 C)   97.7 F (36.5 C)  TempSrc:    Oral  SpO2: 91% 95% 95% 97%  Weight:      Height:        Intake/Output Summary (Last 24 hours) at 06/05/2023 0758 Last data filed at 06/04/2023 2100 Gross per 24 hour  Intake 120 ml  Output 1150 ml  Net -1030 ml   Filed  Weights   05/30/23 1049 06/03/23 0500 06/04/23 0436  Weight: 55.3 kg 53.1 kg 54.1 kg    Examination:  General: No acute distress.  Currently on BiPAP.  Looks chronically ill and deconditioned. ENT/neck: No elevated JVD or palpable neck masses noted  respiratory: Bilateral decreased breath sounds at bases with scattered crackles and tachypnea CVS: Rate mostly controlled; S1 and S2 are heard  abdominal: Soft, nontender, distended mildly; no organomegaly, bowel sounds are heard normally Extremities: No clubbing; mild lower extremity edema present  CNS: Still slow to respond.  Alert and oriented.  No focal neurologic deficit.  Able to move extremities Lymph: No obvious palpable lymphadenopathy Skin: No obvious petechia/rashes  psych: Showing no signs of agitation.  Flat affect mostly. Musculoskeletal: No obvious joint erythema/tenderness  Data Reviewed: I have personally reviewed following labs and imaging studies  CBC: Recent Labs  Lab 05/30/23 0042 05/31/23 0411 06/01/23 0337 06/05/23 0404  WBC 18.8* 19.2* 18.4* 15.8*  NEUTROABS 16.8*  --   --  14.0*  HGB 11.2* 10.8* 11.2* 12.6  HCT 34.2* 34.3* 36.1 38.6  MCV 91.2 92.0 92.3 89.4  PLT 269 249 240 305   Basic Metabolic Panel: Recent Labs  Lab 05/31/23 1650 06/01/23 0837 06/02/23 0348 06/03/23 0416 06/04/23 0419 06/05/23 0404  NA 139 142 142 139  --  137  K 4.2 3.8 3.5 3.9  --  3.5  CL 106 101 101 99  --  93*  CO2 24 29 32 29  --  32  GLUCOSE 125* 142* 160* 153*  --  151*  BUN 26* 31* 38* 39*  --  33*  CREATININE 0.59 0.71 0.83 0.80  --  0.82  CALCIUM  8.4* 8.8* 8.7* 8.8*  --  8.6*  MG  --   --   --  1.9 2.3 2.2   GFR: Estimated Creatinine Clearance: 38.9 mL/min (by C-G formula based on SCr of 0.82 mg/dL). Liver Function Tests: Recent Labs  Lab 05/30/23 0042 05/31/23 1650 06/05/23 0404  AST 19 16 22   ALT 21 24 28   ALKPHOS 66 56 64  BILITOT 1.0 0.8 1.0  PROT 6.1* 5.1* 5.8*  ALBUMIN 3.1* 2.3* 2.7*   No  results for input(s): "LIPASE", "AMYLASE" in the last 168 hours. No results for input(s): "AMMONIA" in the last 168 hours. Coagulation Profile: Recent Labs  Lab 05/30/23 0042  INR 1.1   Cardiac Enzymes: No results for input(s): "CKTOTAL", "CKMB", "CKMBINDEX", "TROPONINI" in the last 168 hours. BNP (last 3 results) No results for input(s): "PROBNP" in the last 8760 hours. HbA1C: No results for input(s): "HGBA1C" in the last 72 hours. CBG: No results for input(s): "GLUCAP" in the last 168 hours. Lipid Profile: No results for input(s): "CHOL", "HDL", "LDLCALC", "TRIG", "CHOLHDL", "LDLDIRECT" in the last 72 hours. Thyroid  Function Tests: No results for input(s): "TSH", "T4TOTAL", "FREET4", "T3FREE", "THYROIDAB" in the last 72 hours.  Anemia  Panel: No results for input(s): "VITAMINB12", "FOLATE", "FERRITIN", "TIBC", "IRON", "RETICCTPCT" in the last 72 hours. Sepsis Labs: Recent Labs  Lab 05/30/23 0113 05/31/23 1026  PROCALCITON  --  2.70  LATICACIDVEN 1.0  --     Recent Results (from the past 240 hours)  Resp panel by RT-PCR (RSV, Flu A&B, Covid) Anterior Nasal Swab     Status: None   Collection Time: 05/30/23 12:42 AM   Specimen: Anterior Nasal Swab  Result Value Ref Range Status   SARS Coronavirus 2 by RT PCR NEGATIVE NEGATIVE Final    Comment: (NOTE) SARS-CoV-2 target nucleic acids are NOT DETECTED.  The SARS-CoV-2 RNA is generally detectable in upper respiratory specimens during the acute phase of infection. The lowest concentration of SARS-CoV-2 viral copies this assay can detect is 138 copies/mL. A negative result does not preclude SARS-Cov-2 infection and should not be used as the sole basis for treatment or other patient management decisions. A negative result may occur with  improper specimen collection/handling, submission of specimen other than nasopharyngeal swab, presence of viral mutation(s) within the areas targeted by this assay, and inadequate number of  viral copies(<138 copies/mL). A negative result must be combined with clinical observations, patient history, and epidemiological information. The expected result is Negative.  Fact Sheet for Patients:  BloggerCourse.com  Fact Sheet for Healthcare Providers:  SeriousBroker.it  This test is no t yet approved or cleared by the [place]  FDA and  has been authorized for detection and/or diagnosis of SARS-CoV-2 by FDA under an Emergency Use Authorization (EUA). This EUA will remain  in effect (meaning this test can be used) for the duration of the COVID-19 declaration under Section 564(b)(1) of the Act, 21 U.S.C.section 360bbb-3(b)(1), unless the authorization is terminated  or revoked sooner.       Influenza A by PCR NEGATIVE NEGATIVE Final   Influenza B by PCR NEGATIVE NEGATIVE Final    Comment: (NOTE) The Xpert Xpress SARS-CoV-2/FLU/RSV plus assay is intended as an aid in the diagnosis of influenza from Nasopharyngeal swab specimens and should not be used as a sole basis for treatment. Nasal washings and aspirates are unacceptable for Xpert Xpress SARS-CoV-2/FLU/RSV testing.  Fact Sheet for Patients: BloggerCourse.com  Fact Sheet for Healthcare Providers: SeriousBroker.it  This test is not yet approved or cleared by the [place]  FDA and has been authorized for detection and/or diagnosis of SARS-CoV-2 by FDA under an Emergency Use Authorization (EUA). This EUA will remain in effect (meaning this test can be used) for the duration of the COVID-19 declaration under Section 564(b)(1) of the Act, 21 U.S.C. section 360bbb-3(b)(1), unless the authorization is terminated or revoked.     Resp Syncytial Virus by PCR NEGATIVE NEGATIVE Final    Comment: (NOTE) Fact Sheet for Patients: BloggerCourse.com  Fact Sheet for Healthcare  Providers: SeriousBroker.it  This test is not yet approved or cleared by the [place]  FDA and has been authorized for detection and/or diagnosis of SARS-CoV-2 by FDA under an Emergency Use Authorization (EUA). This EUA will remain in effect (meaning this test can be used) for the duration of the COVID-19 declaration under Section 564(b)(1) of the Act, 21 U.S.C. section 360bbb-3(b)(1), unless the authorization is terminated or revoked.  Performed at [facility], [address], [place], [place] [address]   Urine culture     Status: Abnormal   Collection Time: 05/30/23 12:42 AM   Specimen: Urine, Clean Catch  Result Value Ref Range Status   Specimen Description  Final    URINE, CLEAN CATCH Performed at [facility], [address], [place], [place] [address]    Special Requests   Final    NONE Performed at [facility], [address] [place], [place] [address]    Culture (A)  Final    <10,000 COLONIES/mL INSIGNIFICANT GROWTH Performed at [facility], [address], [place], [place] [address]    Report Status 05/31/2023 FINAL  Final  Blood Culture (routine x 2)     Status: None   Collection Time: 05/30/23  1:22 AM   Specimen: BLOOD RIGHT FOREARM  Result Value Ref Range Status   Specimen Description   Final    BLOOD RIGHT FOREARM Performed at [facility], [address] [place] [address] [place], [place] [address]    Special Requests   Final    BOTTLES DRAWN AEROBIC AND ANAEROBIC Blood Culture adequate volume Performed at [facility], [address], [place], [place] [address]    Culture   Final    NO GROWTH 5 DAYS Performed at [facility], [address] [place], [place] [address]    Report Status 06/04/2023 FINAL  Final  Blood Culture (routine x 2)     Status: None   Collection Time: 05/30/23  1:32 AM   Specimen: BLOOD RIGHT HAND  Result  Value Ref Range Status   Specimen Description   Final    BLOOD RIGHT HAND Performed at [facility], [address], [place], [place] [address]    Special Requests   Final    BOTTLES DRAWN AEROBIC AND ANAEROBIC Blood Culture adequate volume Performed at [facility], [address] [place], [place] [address]    Culture   Final    NO GROWTH 5 DAYS Performed at [facility], [address], [place], [place] [address]    Report Status 06/04/2023 FINAL  Final  Respiratory (~20 pathogens) panel by PCR     Status: Abnormal   Collection Time: 06/04/23  1:50 PM   Specimen: Nasopharyngeal Swab; Respiratory  Result Value Ref Range Status   Adenovirus NOT DETECTED NOT DETECTED Final   Coronavirus 229E NOT DETECTED NOT DETECTED Final    Comment: (NOTE) The Coronavirus on the Respiratory Panel, DOES NOT test for the novel  Coronavirus (2019 nCoV)    Coronavirus HKU1 NOT DETECTED NOT DETECTED Final   Coronavirus NL63 NOT DETECTED NOT DETECTED Final   Coronavirus OC43 NOT DETECTED NOT DETECTED Final   Metapneumovirus NOT DETECTED NOT DETECTED Final   Rhinovirus / Enterovirus NOT DETECTED NOT DETECTED Final   Influenza A NOT DETECTED NOT DETECTED Final   Influenza B NOT DETECTED NOT DETECTED Final   Parainfluenza Virus 1 NOT DETECTED NOT DETECTED Final   Parainfluenza Virus 2 NOT DETECTED NOT DETECTED Final   Parainfluenza Virus 3 DETECTED (A) NOT DETECTED Final   Parainfluenza Virus 4 NOT DETECTED NOT DETECTED Final   Respiratory Syncytial Virus NOT DETECTED NOT DETECTED Final   Bordetella pertussis NOT DETECTED NOT DETECTED Final   Bordetella Parapertussis NOT DETECTED NOT DETECTED Final   Chlamydophila pneumoniae NOT DETECTED NOT DETECTED Final   Mycoplasma pneumoniae NOT DETECTED NOT DETECTED Final    Comment: Performed at [facility], [address] [place], [place] [address]         Radiology Studies: DG CHEST PORT 1 VIEW Result Date:  06/03/2023 CLINICAL DATA:  Dyspnea EXAM: PORTABLE CHEST  1 VIEW COMPARISON:  05/30/2023 FINDINGS: Cardiac shadow is stable. Aortic calcifications are noted. Increasing basilar airspace opacity and small effusions are noted when compared with the prior exam. No acute bony abnormality is noted. IMPRESSION: Increasing bibasilar airspace opacities with associated effusions. Electronically Signed   By: Violeta Grey M.D.   On: 06/03/2023 11:01        Scheduled Meds:  apixaban   2.5 mg Oral BID   arformoterol   15 mcg Nebulization BID   budesonide  (PULMICORT ) nebulizer solution  0.5 mg Nebulization BID   diltiazem   180 mg Oral Daily   feeding supplement  237 mL Oral BID BM   fluticasone   2 spray Each Nare Daily   furosemide   40 mg Intravenous BID   guaiFENesin   1,200 mg Oral BID   levothyroxine   75 mcg Oral Q0600   methylPREDNISolone  (SOLU-MEDROL ) injection  80 mg Intravenous Q12H   metoprolol  succinate  25 mg Oral Daily   pantoprazole   40 mg Oral BID   polyethylene glycol  17 g Oral Daily   revefenacin   175 mcg Nebulization Daily   senna-docusate  2 tablet Oral BID   sodium chloride  HYPERTONIC  4 mL Nebulization BID   venlafaxine   50 mg Oral BID   Continuous Infusions:  levofloxacin  (LEVAQUIN ) IV 500 mg (06/04/23 1750)          Audria Leather, MD [facility] 06/05/2023, 7:58 AM

## 2023-06-05 NOTE — Plan of Care (Signed)

## 2023-06-05 NOTE — Consult Note (Addendum)
 Consultation Note Date: 06/05/2023   Patient Name: Priscilla Cantrell  DOB: 1935-07-12  MRN: [id number]  Age / Sex: 88 y.o., female  PCP: Priscilla Dick, MD Referring Physician: Audria Leather, MD  Reason for Consultation: Establishing goals of care  HPI/Patient Profile: 88 y.o. female admitted on 05/30/2023  88 year old lady with severe COPD, chronic hypoxic respiratory failure who lives at home with her significant other in [place], Priscilla Cantrell . Patient admitted to hospital medicine service with cardiology and pulmonary colleagues following.  Clinical Assessment and Goals of Care: Patient currently on heated high flow nasal cannula, acute on chronic hypoxic respiratory failure due to parainfluenza pneumonia, possible superimposed bacterial pneumonia, has also required BiPAP earlier in this hospitalization, concern for acute diastolic congestive heart failure exacerbation, concern for new onset atrial fibrillation with rapid ventricular response and paroxysmal SVT in this hospitalization.  Both cardiac and pulmonary colleagues following and optimizing her medication regimen and providing recommendations. Palliative consult for CODE STATUS and broad goals of care discussions has been requested. Chart reviewed, patient seen and examined.  Respiratory therapy colleague also present in the room.  Patient on heated high flow nasal cannula.  Introduced myself and palliative care as follows: Palliative medicine is specialized medical care for people living with serious illness. It focuses on providing relief from the symptoms and stress of a serious illness. The goal is to improve quality of life for both the patient and the family. Goals of care: Broad aims of medical therapy in relation to the patient's values and preferences. Our aim is to provide medical care aimed at enabling patients to achieve the goals that  matter most to them, given the circumstances of their particular medical situation and their constraints.    LEGAL GUARDIAN Priscilla Cantrell [phone]  SUMMARY OF RECOMMENDATIONS   CODE STATUS and goals of care discussions: Chart reviewed, in the advance care planning documents section there is a living will document scanned and available that lists patient's significant other Priscilla Cantrell as her designated healthcare power of attorney agent.  Discussed about CODE STATUS with the patient.  Patient states that she does not want to discuss about it at present.  She states that she has a lot on her mind today.  Discussed with the bedside RN colleague and found out that patient's dog is being put down today.  Call placed but unable to reach Mr. Priscilla Cantrell at this time.  Will allow some time and space for today and palliative will reattempt CODE STATUS and broad goals of care discussions over the weekend.  Continue current mode of care for now, monitor hospital course and overall disease trajectory of illness.  Initial thoughts are that the patient will require at least outpatient palliative support towards the end of this hospitalization. Thank you for the consult.  Code Status/Advance Care Planning: Full code   Symptom Management:     Palliative Prophylaxis:  Delirium Protocol  Additional Recommendations (Limitations, Scope, Preferences): Full Scope Treatment  Psycho-social/Spiritual:  Desire for further Chaplaincy support:yes  Additional Recommendations: Caregiving  Support/Resources  Prognosis:  Unable to determine  Discharge Planning: To Be Determined      Primary Diagnoses: Present on Admission:  Sepsis due to pneumonia [facility])  Acute respiratory failure with hypoxia (HCC)   I have reviewed the medical record, interviewed the patient and family, and examined the patient. The following aspects are pertinent.  Past Medical History:  Diagnosis Date   Allergic rhinitis     with upper airway cough: as of 07/2017 pulm f/u she was instructed to use astelin , flonase , and saline more consistently   BPPV (benign paroxysmal positional vertigo) 03/2018   C. difficile colitis    Chest pain, non-cardiac    Cardiac CT showed no signif obstructive dz, mildly elevated calcium  level (Dr. Audery Cantrell).   Chronic cystitis with hematuria    Priscilla Cantrell   Chronic hypoxemic respiratory failure (HCC) 02/02/2015   2L oxygen  24/7   Chronic renal insufficiency, stage 2 (mild)    GFR approx 60   Cold hands    NCS/EMGs normal.  Hand dysesthesias per neuro--reassured   COPD (chronic obstructive pulmonary disease) (HCC)    GOLD 4.  spirometry 01/10/09 FEV 0.97(52%), FEV1% 47.  With chronic bronchitis as of 07/2017.   DDD (degenerative disc disease), cervical    1998 MRI C5-6 impingemt (left).  MRI 2021 (neurol) 1 level canal stenosis, 1 level foraminal stenosis--->PT   DDD (degenerative disc disease), lumbar    Depression with anxiety 04/15/2011   Diarrheal disease Summer 2017   ? 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 Dr Priscilla Cantrell).  As of 10/05/15 pt reports GI dx'd her with C diff and rx'd more flagyl  and she is also on cholestyramine .  As of 02/2016, pt's sx's resolved completely.   Diverticulosis of colon    Procto '97 and colonoscopy 2002   Dysplastic nevus of upper extremity 04/2014   R tricep (Dr. Steen Cantrell)   Edema of both lower extremities due to peripheral venous insufficiency    Varicosities bilat, swelling L>R.  R baker's cyst.  Got vein clinic eval 02/2018->sclerotherapy recommended but since no hemorrhage or ulceration, insurance will not cover the procedure.   Family history of adverse reaction to anesthesia    mother died when patient age 81 receiving anesthesia for thyroid  surgery   Fibrocystic breast disease    w/fibroadenoma.  Bx's showed NO atypia (Dr. Linell Cantrell).  Mammo  neg 2008.   GERD (gastroesophageal reflux disease)    History of double vision    Ophthalmologist, Dr. Lasandra Cantrell, is further evaluating this with MRI  orbits and limited brain (myesthenia gravis testing neg 07/2010)   History of home oxygen  therapy    uses 2 liters at hs   Hypertension    EKG 03/2010 normal   Hypothyroidism    Hashimoto's, dx'd 1989   Idiopathic peripheral neuropathy 04/2018   Sensory (numb feet) S1 distribution bilat, c/w spinal stenosis sensory neuropathy (no pain). 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  Labs Alsen, MRI C spine->some spondylosis/stenosis.  UE NCS/EMS: normal 04/2019.   Lesion of right native kidney 2017   found on CT 02/2015.  F/u MRI 03/31/15 showed it to be smaller and less likely of any significance but repeat CT renal protocol in 44mo was recommended by radiology.  This was done 10/26/15 and showed no interval change in the lesion, but b/c the lesion enhances with contrast, radiol rec'd urol consult (  b/c pap renal RCC could not be excluded).  Saw urol 03/06/16--stable on u/s 09/2016 and 09/2017.   OAB (overactive bladder)    Osteoarthritis, hip, bilateral    she is s/p bilat THA as of 02/2018.   Osteoporosis    radius, T score -2.5->rec'd alendronate  08/2019->plan rpt DEXA 08/2021.   Pneumonia    Postmenopausal atrophic vaginitis    improved with estrace 2 X/week.   Prolapse of vaginal cuff after hysterectomy    10/2018: Dr. Zackary Heron. Mild colpocele 04/2019 per GYN (no surgery)   Pulmonary nodule    CT chest 12/10, June 2011.  No nodule on CT 06/2010.   Recurrent Clostridium difficile diarrhea    Episode 09/19/2016: resolved with prolonged course of flagyl .  11/2016 recurrence treated with 10d of Dificid (Fidaxomicin) by GI (Dr. Andriette Cantrell).   Recurrent UTI    likely related to pt's atrophic vaginitis.  Urol started vaginal estrace 03/2016.   Rib fracture 09/2018   R 7th, laterally-->sustained when her dog  pulled her over and she fell.   RLS (restless legs syndrome)    neuro->gabapentin  trial 2020/21   Shingles 02/2014   R abd/flank   Spinal stenosis of lumbar region with neurogenic claudication    2008 MRI (L3-4, L4-5, L5-S1).  +S1 distribution sensory loss bilat   Social History   Socioeconomic History   Marital status: Widowed    Spouse name: Not on file   Number of children: 1   Years of education: Not on file   Highest education level: Not on file  Occupational History   Occupation: SECRETARY    Employer: [employer]  Tobacco Use   Smoking status: Former    Current packs/day: 0.00    Average packs/day: 1.5 packs/day for 34.0 years (51.0 ttl pk-yrs)    Types: Cigarettes    Start date: 49    Quit date: 02/03/1990    Years since quitting: 33.3   Smokeless tobacco: Never  Vaping Use   Vaping status: Never Used  Substance and Sexual Activity   Alcohol  use: Yes    Comment: Rarely   Drug use: No   Sexual activity: Not Currently    Comment: 1st intercourse 49 yo-1 partner  Other Topics Concern   Not on file  Social History Narrative   Widowed since fall 2014   Lives in [place], [place].      Worked as Radiographer, therapeutic, retired 2017.   She has support of her daughter and grandson. She is independent with all care needs and transportation at this time. 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    She reports she is on SSI and receives another separate check    She reports her work hx included working in Education officer, environmental for financial and Haematologist businesses and volunteered in a [employer]    She reports having a short hair dog for 2 years  but denies noting any allergic reactions   Right handed   Two story home   Social Drivers of Corporate investment banker Strain: Low Risk  (03/19/2022)   Overall Financial Resource Strain (CARDIA)    Difficulty of Paying Living Expenses: Not hard at all  Food  Insecurity: No Food Insecurity (05/30/2023)   Hunger Vital Sign    Worried About Running Out of Food in the Last Year: Never true    Ran Out of Food in the Last Year: Never  true  Transportation Needs: No Transportation Needs (05/30/2023)   PRAPARE - Administrator, Civil Service (Medical): No    Lack of Transportation (Non-Medical): No  Physical Activity: Sufficiently Active (03/19/2022)   Exercise Vital Sign    Days of Exercise per Week: 7 days    Minutes of Exercise per Session: 150+ min  Stress: No Stress Concern Present (03/19/2022)   [employer] of Occupational Health - Occupational Stress Questionnaire    Feeling of Stress : Not at all  Social Connections: Socially Integrated (05/30/2023)   Social Connection and Isolation Panel [NHANES]    Frequency of Communication with Friends and Family: More than three times a week    Frequency of Social Gatherings with Friends and Family: More than three times a week    Attends Religious Services: More than 4 times per year    Active Member of [employer] or Organizations: Yes    Attends Engineer, structural: More than 4 times per year    Marital Status: Living with partner   Family History  Problem Relation Age of Onset   Goiter Mother    Psoriasis Father        od liver   Cirrhosis Father    Diabetes Daughter        Type 2   Stroke Maternal Grandfather    Stroke Paternal Grandmother    Hypertension Paternal Grandmother    Hernia Paternal Grandmother    Cancer Paternal Grandfather        lung   Cancer Maternal Aunt        cancer of the stomach   Scheduled Meds:  apixaban   2.5 mg Oral BID   arformoterol   15 mcg Nebulization BID   budesonide  (PULMICORT ) nebulizer solution  0.5 mg Nebulization BID   diltiazem   180 mg Oral Daily   feeding supplement  237 mL Oral BID BM   fluticasone   2 spray Each Nare Daily   furosemide   60 mg Intravenous BID   guaiFENesin   1,200 mg Oral BID   levothyroxine   75 mcg Oral Q0600    methylPREDNISolone  (SOLU-MEDROL ) injection  80 mg Intravenous Q12H   metoprolol  succinate  25 mg Oral Daily   pantoprazole   40 mg Oral BID   polyethylene glycol  17 g Oral Daily   revefenacin   175 mcg Nebulization Daily   senna-docusate  2 tablet Oral BID   sodium chloride  HYPERTONIC  4 mL Nebulization BID   venlafaxine   50 mg Oral BID   Continuous Infusions:  levofloxacin  (LEVAQUIN ) IV     PRN Meds:.acetaminophen  **OR** acetaminophen , levalbuterol , ondansetron  **OR** ondansetron  (ZOFRAN ) IV Medications Prior to Admission:  Prior to Admission medications   Medication Sig Start Date End Date Taking? Authorizing Provider  acetaminophen  (TYLENOL ) 325 MG tablet Take 2 tablets (650 mg total) by mouth every 6 (six) hours. Patient taking differently: Take 650 mg by mouth every 6 (six) hours as needed for moderate pain (pain score 4-6) or fever. 02/14/23  Yes Gonfa, Taye T, MD  albuterol  (PROAIR  HFA) 108 (90 Base) MCG/ACT inhaler Inhale 2 puffs into the lungs every 4 (four) hours as needed for wheezing or shortness of breath. 12/30/22  Yes Mannam, Praveen, MD  albuterol  (PROVENTIL ) (2.5 MG/3ML) 0.083% nebulizer solution Take 2.5 mg by nebulization every 6 (six) hours as needed for wheezing or shortness of breath.   Yes [provider]  amLODipine  (NORVASC ) 10 MG tablet Take 1 tablet (10 mg total) by mouth daily. 04/14/23  Yes McGowen, Minetta Aly, MD  azithromycin  (ZITHROMAX ) 250 MG tablet 2 tabs po qd x 1d, then 1 tab po qd x 4d 05/28/23  Yes McGowen, Minetta Aly, MD  fluticasone  (FLONASE ) 50 MCG/ACT nasal spray Place 2 sprays into both nostrils daily. 05/28/23  Yes McGowen, Minetta Aly, MD  Fluticasone -Umeclidin-Vilant (TRELEGY ELLIPTA ) 100-62.5-25 MCG/ACT AEPB Inhale 1 puff into the lungs daily. 05/07/23  Yes Antonio Baumgarten, NP  levothyroxine  (SYNTHROID ) 100 MCG tablet TAKE 1 TABLET BY MOUTH EVERY DAY 04/14/23  Yes McGowen, Minetta Aly, MD  lidocaine  (XYLOCAINE ) 2 % solution Apply 1/2 ml to affected  area of mouth 3 times per day as needed for pain 03/05/23  Yes McGowen, Minetta Aly, MD  pantoprazole  (PROTONIX ) 40 MG tablet Take 1 tablet (40 mg total) by mouth 2 (two) times daily. 04/22/23  Yes McGowen, Minetta Aly, MD  predniSONE  (DELTASONE ) 10 MG tablet 4 tabs po every day x 4d, then 3 tabs po every day x 4d, then 2 tabs po every day x 4d, then 1 tab po every day x 4d 05/28/23  Yes McGowen, Minetta Aly, MD  venlafaxine  (EFFEXOR ) 50 MG tablet Take 50 mg by mouth 2 (two) times daily. 11/28/22  Yes [provider]  Misc. Devices (PULSE OXIMETER) MISC Check oxygen  level as needed 03/12/22   McGowen, Minetta Aly, MD   Allergies  Allergen Reactions   Losartan  Other (See Comments)    hyperkalemia   Penicillins Hives, Swelling and Rash    Has patient had a PCN reaction causing immediate rash, facial/tongue/throat swelling, SOB or lightheadedness with hypotension: YES Has patient had a PCN reaction causing severe rash involving mucus membranes or skin necrosis: NO Has patient had a PCN reaction that required hospitalization NO Has patient had a PCN reaction occurring within the last 10 years: NO If all of the above answers are "NO", then may proceed with Cephalosporin use.    Cefixime [Kdc:Cefixime] Other (See Comments)    unspecified   Gabapentin  Other (See Comments)    Feels woozy   Indocin [Indomethacin] Other (See Comments)    Painful tongue and throat   Singulair  [Montelukast  Sodium]     Chest tightness, decreased mental clarity   Sulfa Antibiotics Other (See Comments)    unspecified   Ace Inhibitors Other (See Comments)    cough   Statins Other (See Comments)    Myalgias    Review of Systems Weakness Shortness of breath Physical Exam Elderly appearing lady resting in bed On heated high flow nasal cannula Does not have tachypnea or accessory muscle use however does appear visibly in mild distress, some look of apprehension/PR on face Awake alert answers questions  appropriately  Vital Signs: BP 135/79 (BP Location: Left Arm)   Pulse (!) 59   Temp 97.7 F (36.5 C) (Oral)   Resp (!) 21   Ht 5\' 2"  (1.575 m)   Wt 54.1 kg   SpO2 98%   BMI 21.81 kg/m  Pain Scale: 0-10   Pain Score: 0-No pain   SpO2: SpO2: 98 % O2 Device:SpO2: 98 % O2 Flow Rate: .O2 Flow Rate (L/min): 50 L/min  IO: Intake/output summary:  Intake/Output Summary (Last 24 hours) at 06/05/2023 1215 Last data filed at 06/04/2023 2100 Gross per 24 hour  Intake --  Output 650 ml  Net -650 ml    LBM: Last BM Date : 06/04/23 Baseline Weight: Weight: 55.3 kg Most recent weight: Weight: 54.1 kg     Palliative Assessment/Data:  Palliative performance scale 50%  Time In: 11 Time Out: 12 Time Total: 60 Greater than 50%  of this time was spent counseling and coordinating care related to the above assessment and plan.  Signed by: Lujean Sake, MD   Please contact Palliative Medicine Team phone at [phone] for questions and concerns.  For individual provider: See Tilford Foley

## 2023-06-05 NOTE — Progress Notes (Signed)
   06/05/23 2307  BiPAP/CPAP/SIPAP  BiPAP/CPAP/SIPAP Pt Type Adult  BiPAP/CPAP/SIPAP SERVO  Mask Type Full face mask  Dentures removed? Not applicable  Mask Size Small  Set Rate 15 breaths/min  Respiratory Rate 24 breaths/min  IPAP 14 cmH20  EPAP 6 cmH2O  Pressure Support 8 cmH20  PEEP 6 cmH20  FiO2 (%) 80 %  Minute Ventilation 12.7  Leak 14  Peak Inspiratory Pressure (PIP) 16  Tidal Volume (Vt) 466  Patient Home Machine No  Patient Home Mask No  Patient Home Tubing No  Auto Titrate No  Press High Alarm 30 cmH2O  Press Low Alarm 5 cmH2O  Device Plugged into RED Power Outlet Yes  Oxygen  Percent 80 %

## 2023-06-05 NOTE — Plan of Care (Signed)
  Problem: Education: Goal: Knowledge of General Education information will improve Description: Including pain rating scale, medication(s)/side effects and non-pharmacologic comfort measures Outcome: Progressing   Problem: Clinical Measurements: Goal: Diagnostic test results will improve Outcome: Progressing Goal: Respiratory complications will improve Outcome: Progressing   Problem: Nutrition: Goal: Adequate nutrition will be maintained Outcome: Progressing   Problem: Coping: Goal: Level of anxiety will decrease Outcome: Progressing

## 2023-06-05 NOTE — Progress Notes (Addendum)
 Progress Note  Patient Name: Priscilla Cantrell Date of Encounter: 06/05/2023  [facility] Cardiologist: None   Patient Profile     Subjective   Increased O2 needs yesterday.  On BiPAP overnight with O2 sats 97%  Inpatient Medications    Scheduled Meds:  apixaban   2.5 mg Oral BID   arformoterol   15 mcg Nebulization BID   budesonide  (PULMICORT ) nebulizer solution  0.5 mg Nebulization BID   diltiazem   180 mg Oral Daily   feeding supplement  237 mL Oral BID BM   fluticasone   2 spray Each Nare Daily   furosemide   40 mg Intravenous BID   guaiFENesin   1,200 mg Oral BID   levothyroxine   75 mcg Oral Q0600   methylPREDNISolone  (SOLU-MEDROL ) injection  80 mg Intravenous Q12H   metoprolol  succinate  25 mg Oral Daily   pantoprazole   40 mg Oral BID   polyethylene glycol  17 g Oral Daily   revefenacin   175 mcg Nebulization Daily   senna-docusate  2 tablet Oral BID   sodium chloride  HYPERTONIC  4 mL Nebulization BID   venlafaxine   50 mg Oral BID   Continuous Infusions:  levofloxacin  (LEVAQUIN ) IV 500 mg (06/04/23 1750)   PRN Meds: acetaminophen  **OR** acetaminophen , levalbuterol , ondansetron  **OR** ondansetron  (ZOFRAN ) IV   Vital Signs    Vitals:   06/04/23 2205 06/05/23 0329 06/05/23 0400 06/05/23 0500  BP: (!) 137/95   135/79  Pulse: 63 (!) 57 62 72  Resp: 16 (!) 29 (!) 23 (!) 23  Temp: 98.4 F (36.9 C)   97.7 F (36.5 C)  TempSrc:    Oral  SpO2: 91% 95% 95% 97%  Weight:      Height:        Intake/Output Summary (Last 24 hours) at 06/05/2023 0809 Last data filed at 06/04/2023 2100 Gross per 24 hour  Intake 120 ml  Output 650 ml  Net -530 ml      06/04/2023    4:36 AM 06/03/2023    5:00 AM 05/30/2023   10:49 AM  Last 3 Weights  Weight (lbs) 119 lb 4.3 oz 117 lb 1 oz 121 lb 14.6 oz  Weight (kg) 54.1 kg 53.1 kg 55.3 kg      Telemetry    NSR with frequent PACs-  personally Reviewed  ECG    No new EKG to review- Personally Reviewed  Physical Exam   GEN:  thin and ill appearing HEENT: Normal NECK: No JVD; No carotid bruits LYMPHATICS: No lymphadenopathy CARDIAC:RRR, no murmurs, rubs, gallops RESPIRATORY:  decreased BS at bases with crackles ABDOMEN: Soft, non-tender, non-distended MUSCULOSKELETAL:  No edema; No deformity  SKIN: Warm and dry NEUROLOGIC:  Alert and oriented x 3 PSYCHIATRIC:  Normal affect  Labs    High Sensitivity Troponin:  No results for input(s): "TROPONINIHS" in the last 720 hours.    Chemistry Recent Labs  Lab 05/30/23 0042 05/31/23 0411 05/31/23 1650 06/01/23 0837 06/02/23 0348 06/03/23 0416 06/05/23 0404  NA 136   < > 139   < > 142 139 137  K 3.7   < > 4.2   < > 3.5 3.9 3.5  CL 99   < > 106   < > 101 99 93*  CO2 24   < > 24   < > 32 29 32  GLUCOSE 153*   < > 125*   < > 160* 153* 151*  BUN 24*   < > 26*   < > 38*  39* 33*  CREATININE 0.94   < > 0.59   < > 0.83 0.80 0.82  CALCIUM  8.5*   < > 8.4*   < > 8.7* 8.8* 8.6*  PROT 6.1*  --  5.1*  --   --   --  5.8*  ALBUMIN 3.1*  --  2.3*  --   --   --  2.7*  AST 19  --  16  --   --   --  22  ALT 21  --  24  --   --   --  28  ALKPHOS 66  --  56  --   --   --  64  BILITOT 1.0  --  0.8  --   --   --  1.0  GFRNONAA 59*   < > >60   < > >60 >60 >60  ANIONGAP 13   < > 9   < > 9 11 12    < > = values in this interval not displayed.     Hematology Recent Labs  Lab 05/31/23 0411 06/01/23 0337 06/05/23 0404  WBC 19.2* 18.4* 15.8*  RBC 3.73* 3.91 4.32  HGB 10.8* 11.2* 12.6  HCT 34.3* 36.1 38.6  MCV 92.0 92.3 89.4  MCH 29.0 28.6 29.2  MCHC 31.5 31.0 32.6  RDW 13.1 13.0 12.8  PLT 249 240 305    BNP Recent Labs  Lab 05/31/23 1026 06/03/23 0416  BNP 834.8* 433.8*     DDimer No results for input(s): "DDIMER" in the last 168 hours.   CHA2DS2-VASc Score = 4   This indicates a 4.8% annual risk of stroke. The patient's score is based upon: CHF History: 0 HTN History: 1 Diabetes History: 0 Stroke History: 0 Vascular Disease History: 0 Age Score:  2 Gender Score: 1   Radiology    DG CHEST PORT 1 VIEW Result Date: 06/03/2023 CLINICAL DATA:  Dyspnea EXAM: PORTABLE CHEST 1 VIEW COMPARISON:  05/30/2023 FINDINGS: Cardiac shadow is stable. Aortic calcifications are noted. Increasing basilar airspace opacity and small effusions are noted when compared with the prior exam. No acute bony abnormality is noted. IMPRESSION: Increasing bibasilar airspace opacities with associated effusions. Electronically Signed   By: Violeta Grey M.D.   On: 06/03/2023 11:01    Patient Profile     88 y.o. female with a hx of oxygen  dependent COPD with 2LNC, hypothyroidism, HTN, depression,  who is being seen 06/01/2023 for the evaluation of CHF at the request of Dr Alfonse Angle.   Assessment & Plan    Acute on Chronic Hypoxemic Respiratory Failure COPD exacerbation/CAP ?Acute diastolic CHF Symptoms most consistent with COPD exacerbation due to CAP (patient had fever of 102 with bilateral lower lobe airspace opacities and leukocytosis) Currently on IV antibiotics, Mucomyst , Xopenex  and Atrovent  nebs as well as IV Solu-Medrol  Currently on Lasix  40mg  IV daily weight down 3lbs from admission>>no weight today UOP 1.15L yesterday and net - 4.2L since admit K_+ 3.5>>replete to keep >4 Mag 2.2 2D echo EF 60 to 65% with G1 DD and elevated LAP, moderate RV enlargement with normal RV systolic function, moderate RAE, moderate to severe TR and RAP estimated at 15 mmHg Suspect she does have a component of acute diastolic CHF likely triggered by her COPD exacerbation and pneumonia On BiPAP overnight with increased O2 demands yesterday and Cxray with increased opacities>>repeat cxray pending BNP has decreased from 834->>433 Echo showed RAP estimated at 15 mmHg consistent with persistent volume overload  Increase lasix  to  60mg  IV BID>>with her poor lung function from COPD need to keep as dry as possible Strict I's and O's, daily weights and renal function while diuresing   New  onset atrial fibrillation with RVR Paroxysmal SVT She has no history of atrial fibrillation in the past normal sinus rhythm on admission but EKG 4/27 showed atrial fibrillation Telemetry has shown normal sinus rhythm with frequent PVCs and short bursts of SVT that are consistent with nonsustained atrial tachycardia.  With improvement in O2 saturations arrhythmias have resolved and now normal sinus rhythm on telemetry TSH minimally reduced at 0.313>> T4 significantly elevated at 2.15 which may be driving her A-fib [id number] score is 7  (hypertension, CHF, hypertension, CAD, age> 9, female) She is at significant increased risk for cardioembolic events and has been started on Eliquis  2.5 mg twice daily (dose reduced due to age >3 and weight <60 kg) Continue Cardizem  CD 180mg  daily and Toprol  XL 25mg  daily  Hyperthyroidism Synthroid  dose has been decreased    I spent 35 minutes caring for this patient today face to face, ordering and reviewing labs, reviewing records from 2D echo  2023, Cxray this admit, seeing the patient, documenting in the record and arranging for repeat echo and chest xray  For questions or updates, please contact [facility] Please consult [email or web address] for contact info under        Signed, Gaylyn Keas, MD  06/05/2023, 8:09 AM

## 2023-06-05 NOTE — Progress Notes (Signed)
 Physical Therapy Treatment Patient Details Name: Priscilla Cantrell MRN: [id number] DOB: May 03, 1935 Today's Date: 06/05/2023   History of Present Illness Patient is a 88 year old female who presented on 4/26 with SOB. Patient was admitted with sepsis, a fib with RVR, acute on chronic hypoxic respiratory failure, COPD exacerbation, acute on chronic diastolic CHF and CAP. Pt requiring HHFNC. 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    PT Comments  Pt now on increased O2 (50 L at 100% FiO2 HHFNC) compared to last visit. Spoke with nurse and agreed on therapy treatment with gradual progression and O2 monitored.  Pt tolerating supine bed exercises well with rest breaks so proceed to sitting EOB with min A and VSS.  Pt's sheets noted to bed wet and pt able to perform stand pivot to recliner with min A, sheets changed, and then stand pivot back to bed.  Pt's sats were 91-93% at rest and dropped to 87% at lowest during session (see comments) but improved with rest breaks.   Pt with slower progress due to resp status - updated recommendation to Patient will benefit from continued inpatient follow up therapy, <3 hours/day until able to progress.    If plan is discharge home, recommend the following: A little help with walking and/or transfers;A little help with bathing/dressing/bathroom;Assistance with cooking/housework;Assist for transportation;Help with stairs or ramp for entrance   Can travel by private vehicle        Equipment Recommendations  None recommended by PT    Recommendations for Other Services       Precautions / Restrictions Precautions Precautions: Fall;Posterior Hip Precaution/Restrictions Comments: monitor O2 and HR. ideal range 88% to 92%; h/o multiple hip dislocations, reviewed posterior precautions (pt was able to recall 1 of 3)     Mobility  Bed Mobility Overal bed  mobility: Needs Assistance Bed Mobility: Supine to Sit, Sit to Supine     Supine to sit: Min assist Sit to supine: Contact guard assist   General bed mobility comments: with increased time and using bed rail.    Transfers Overall transfer level: Needs assistance Equipment used: 1 person hand held assist Transfers: Sit to/from Stand, Bed to chair/wheelchair/BSC Sit to Stand: Min assist     Squat pivot transfers: Min assist     General transfer comment: Proceeded cautiously with transfers with O2 monitored and rest breaks as needed (see comments).  Initially had pt sit EOB, noted bed was wet (Purewick leaked) and pt's sats were stable so transferred to chair, changed bed, and transferred back to bed.    Ambulation/Gait               General Gait Details: defered due to resp status   Stairs             Wheelchair Mobility     Tilt Bed    Modified Rankin (Stroke Patients Only)       Balance Overall balance assessment: Needs assistance Sitting-balance support: No upper extremity supported Sitting balance-Leahy Scale: Good     Standing balance support: Single extremity supported Standing balance-Leahy Scale: Poor                              Communication    Cognition Arousal: Alert Behavior During Therapy: WFL for tasks assessed/performed   PT - Cognitive impairments: No apparent impairments  Cueing    Exercises Other Exercises Other Exercises: AROM 10 reps supine bil : chest press, shoulder retraction with deep breaths, SLR, heel slides with rest breaks after each exercises Other Exercises: AROM 10 reps seated bil: LAQ, trunk rotation with reaching    General Comments General comments (skin integrity, edema, etc.): Pt on 50 L 100% FiO2 HHFNC with sats 91-93% rest.  Sats dropping to 87% with supine UE exercises but recovers with rest breaks.  With transfer to EOB and to chair sats  maintaining 90%.  When pt back to bed sats 92%      Pertinent Vitals/Pain Pain Assessment Pain Assessment: No/denies pain    Home Living                          Prior Function            PT Goals (current goals can now be found in the care plan section) Progress towards PT goals: Progressing toward goals    Frequency    Min 3X/week      PT Plan      Co-evaluation              AM-PAC PT "6 Clicks" Mobility   Outcome Measure  Help needed turning from your back to your side while in a flat bed without using bedrails?: A Little Help needed moving from lying on your back to sitting on the side of a flat bed without using bedrails?: A Little Help needed moving to and from a bed to a chair (including a wheelchair)?: A Little Help needed standing up from a chair using your arms (e.g., wheelchair or bedside chair)?: A Little Help needed to walk in hospital room?: Total Help needed climbing 3-5 steps with a railing? : Total 6 Click Score: 14    End of Session Equipment Utilized During Treatment: Gait belt Activity Tolerance: Patient tolerated treatment well Patient left: with call bell/phone within reach;in bed;with bed alarm set Nurse Communication: Mobility status PT Visit Diagnosis: Difficulty in walking, not elsewhere classified (R26.2);History of falling (Z91.81)     Time: 1610-9604 PT Time Calculation (min) (ACUTE ONLY): 28 min  Charges:    $Therapeutic Exercise: 8-22 mins $Therapeutic Activity: 8-22 mins PT General Charges $$ ACUTE PT VISIT: 1 Visit                     Cyd Dowse, PT Acute Rehab [facility] [phone]    Carolynn Citrin 06/05/2023, 1:57 PM

## 2023-06-06 DIAGNOSIS — J129 Viral pneumonia, unspecified: Secondary | ICD-10-CM | POA: Diagnosis not present

## 2023-06-06 DIAGNOSIS — J9621 Acute and chronic respiratory failure with hypoxia: Secondary | ICD-10-CM

## 2023-06-06 DIAGNOSIS — I5031 Acute diastolic (congestive) heart failure: Secondary | ICD-10-CM | POA: Diagnosis not present

## 2023-06-06 DIAGNOSIS — L8990 Pressure ulcer of unspecified site, unspecified stage: Secondary | ICD-10-CM | POA: Insufficient documentation

## 2023-06-06 LAB — BASIC METABOLIC PANEL WITH GFR
Anion gap: 13 (ref 5–15)
BUN: 36 mg/dL — ABNORMAL HIGH (ref 8–23)
CO2: 35 mmol/L — ABNORMAL HIGH (ref 22–32)
Calcium: 8.5 mg/dL — ABNORMAL LOW (ref 8.9–10.3)
Chloride: 89 mmol/L — ABNORMAL LOW (ref 98–111)
Creatinine, Ser: 0.93 mg/dL (ref 0.44–1.00)
GFR, Estimated: 60 mL/min — ABNORMAL LOW (ref >60)
Glucose, Bld: 200 mg/dL — ABNORMAL HIGH (ref 70–99)
Potassium: 3.3 mmol/L — ABNORMAL LOW (ref 3.5–5.1)
Sodium: 137 mmol/L (ref 135–145)

## 2023-06-06 LAB — CBC WITH DIFFERENTIAL/PLATELET
Abs Immature Granulocytes: 0.46 10*3/uL — ABNORMAL HIGH (ref 0.00–0.07)
Basophils Absolute: 0.1 10*3/uL (ref 0.0–0.1)
Basophils Relative: 0 %
Eosinophils Absolute: 0 10*3/uL (ref 0.0–0.5)
Eosinophils Relative: 0 %
HCT: 41.1 % (ref 36.0–46.0)
Hemoglobin: 12.9 g/dL (ref 12.0–15.0)
Immature Granulocytes: 3 %
Lymphocytes Relative: 2 %
Lymphs Abs: 0.4 10*3/uL — ABNORMAL LOW (ref 0.7–4.0)
MCH: 28.5 pg (ref 26.0–34.0)
MCHC: 31.4 g/dL (ref 30.0–36.0)
MCV: 90.7 fL (ref 80.0–100.0)
Monocytes Absolute: 0.4 10*3/uL (ref 0.1–1.0)
Monocytes Relative: 3 %
Neutro Abs: 14.1 10*3/uL — ABNORMAL HIGH (ref 1.7–7.7)
Neutrophils Relative %: 92 %
Platelets: 304 10*3/uL (ref 150–400)
RBC: 4.53 MIL/uL (ref 3.87–5.11)
RDW: 12.9 % (ref 11.5–15.5)
WBC: 15.4 10*3/uL — ABNORMAL HIGH (ref 4.0–10.5)
nRBC: 0 % (ref 0.0–0.2)

## 2023-06-06 LAB — MAGNESIUM: Magnesium: 2.4 mg/dL (ref 1.7–2.4)

## 2023-06-06 MED ORDER — POTASSIUM CHLORIDE CRYS ER 20 MEQ PO TBCR
40.0000 meq | EXTENDED_RELEASE_TABLET | Freq: Once | ORAL | Status: AC
  Administered 2023-06-06: 40 meq via ORAL
  Filled 2023-06-06: qty 2

## 2023-06-06 MED ORDER — NYSTATIN 100000 UNIT/ML MT SUSP
5.0000 mL | Freq: Four times a day (QID) | OROMUCOSAL | Status: DC
  Administered 2023-06-06 – 2023-06-17 (×44): 500000 [IU] via ORAL
  Filled 2023-06-06 (×43): qty 5

## 2023-06-06 MED ORDER — WHITE PETROLATUM EX OINT
1.0000 | TOPICAL_OINTMENT | Freq: Every day | CUTANEOUS | Status: DC
  Filled 2023-06-06: qty 5

## 2023-06-06 NOTE — Progress Notes (Signed)
   06/06/23 0305  BiPAP/CPAP/SIPAP  BiPAP/CPAP/SIPAP Pt Type Adult  BiPAP/CPAP/SIPAP SERVO  Mask Type Full face mask  Mask Size Small  Set Rate 15 breaths/min  Respiratory Rate 23 breaths/min  IPAP 14 cmH20  EPAP 6 cmH2O  Pressure Support 8 cmH20  PEEP 6 cmH20  FiO2 (%) 80 %  Minute Ventilation 10.6  Leak 38  Peak Inspiratory Pressure (PIP) 14  Tidal Volume (Vt) 446  Patient Home Machine No  Patient Home Mask No  Patient Home Tubing No  Auto Titrate No  Press High Alarm 30 cmH2O  Press Low Alarm 5 cmH2O  Device Plugged into RED Power Outlet Yes

## 2023-06-06 NOTE — Plan of Care (Signed)

## 2023-06-06 NOTE — Consult Note (Signed)
 WOC Nurse Consult Note: Reason for Consult: nare; pressure injury Wound type: Medical Device Related Pressure Injury (MDRPI) Pressure Injury POA: No Measurement: see nursing flowsheets Wound bed: dark purple Drainage (amount, consistency, odor) see nursing flow sheet Periwound:intact  Dressing procedure/placement/frequency: Apply Vaseline daily  Monitor for acute changes.   Priscilla Cantrell [facility], CNS, [employer] [phone]

## 2023-06-06 NOTE — Progress Notes (Signed)
 NAME:  Priscilla Cantrell, MRN:  [id number], DOB:  08/12/1935, LOS: 7 ADMISSION DATE:  05/30/2023, CONSULTATION DATE:  06/06/2023  REFERRING MD:  Livia Riffle , CHIEF COMPLAINT: Respiratory distress  History of Present Illness:  88 year old woman with severe COPD and chronic respiratory failure with hypoxia.  At baseline she uses 2 L at rest and 4 L with exertion.  She was admitted with 2 weeks of shortness of breath and cough with green sputum production and hypoxia to 70% on her baseline 2 L oxygen .  Chest x-ray showed bibasilar infiltrates, urine strep antigen was negative.  She was febrile tachycardic with leukocytosis. She was started on Levaquin  treated with BiPAP Course was complicated by new onset atrial fibrillation with RVR and elevated BNP requiring diuretics.  Cardiology was consulted, echo showed normal LVEF with moderate RV enlargement, moderate to severe TR.  Her weight decreased 5 pounds with diuresis but she continued to be more hypoxic and has required HHFNC. Hence PCCM consulted  Pertinent  Medical History  hypothyroidism, HTN, depression,  PFT 10/29/18 >> FEV1 1.05 (68%), FEV1% 52, TLC 4.29 (96%), DLCO 44%   Significant Hospital Events: Including procedures, antibiotic start and stop dates in addition to other pertinent events   4/30 On HHFNC 35 L / 90%  Interim History / Subjective:  Was up in the chair this morning per tech at bedside. Still on HHFNC.  Objective   Blood pressure 121/61, pulse 72, temperature 97.8 F (36.6 C), temperature source Oral, resp. rate (!) 23, height 5\' 2"  (1.575 m), weight 49.3 kg, SpO2 94%.    FiO2 (%):  [70 %-80 %] 70 % PEEP:  [6 cmH20] 6 cmH20 Pressure Support:  [8 cmH20] 8 cmH20   Intake/Output Summary (Last 24 hours) at 06/06/2023 1345 Last data filed at 06/06/2023 1214 Gross per 24 hour  Intake 509.93 ml  Output 1500 ml  Net -990.07 ml   Filed Weights   06/03/23 0500 06/04/23 0436 06/06/23 0630  Weight: 53.1 kg 54.1 kg 49.3 kg     Examination: Gen: frail, chronically ill appearing woman sitting up in bed in NAD HEENT: [place]/AT, eyes anicteric Lungs: breathing comfortably on HHF, faint rhales, no rhonchi, no accessory muscle use.  Cardiovascular: S1 S2, regular rate and rhythm Abdomen: Nondistended Musculoskeletal: Clubbing, cyanosis, edema Neuro: Awake and alert, answering questions inaccurately, moving extremities   WBC 15.4  RVP + parainfluenza  Resolved Hospital Problem list     Assessment & Plan:  Acute on chronic hypoxic respiratory failure due to parainfleunza pneumonia; cannot rule out superimposed bacterial pneumonia as well Bibasilar pneumonia/community-acquired with worsening infiltrates Her S/O was also sick with similar symptoms - Continue heated high flow nasal cannula, weaned down to 70%, still on 50 L - Incentive spirometry, out of bed mobility as able - Continue steroids and empiric antibiotics, pneumonia dosing of levofloxacin  - Continue triple inhaled therapy-Pulmicort , Brovana , Yupelri  - Anticipate a slow recovery.  Continue wean oxygen  as able.  Unfortunately viral pneumonias are often slow to resolve and she will likely have oxygen  requirements for a while, potentially several weeks  Prognosis guarded given high oxygen  requirements but hopeful for recovery. Need to avoid deconditioning as much as possible.   Best Practice (right click and "Reselect all SmartList Selections" daily)    Code Status:  full code Last date of multidisciplinary goals of care discussion [NA]  Labs   CBC: Recent Labs  Lab 05/31/23 0411 06/01/23 0337 06/05/23 0404 06/06/23 0428  WBC 19.2* 18.4* 15.8* 15.4*  NEUTROABS  --   --  14.0* 14.1*  HGB 10.8* 11.2* 12.6 12.9  HCT 34.3* 36.1 38.6 41.1  MCV 92.0 92.3 89.4 90.7  PLT 249 240 305 304    Basic Metabolic Panel: Recent Labs  Lab 06/01/23 0837 06/02/23 0348 06/03/23 0416 06/04/23 0419 06/05/23 0404 06/06/23 0428  NA 142 142 139  --  137  137  K 3.8 3.5 3.9  --  3.5 3.3*  CL 101 101 99  --  93* 89*  CO2 29 32 29  --  32 35*  GLUCOSE 142* 160* 153*  --  151* 200*  BUN 31* 38* 39*  --  33* 36*  CREATININE 0.71 0.83 0.80  --  0.82 0.93  CALCIUM  8.8* 8.7* 8.8*  --  8.6* 8.5*  MG  --   --  1.9 2.3 2.2 2.4   GFR: Estimated Creatinine Clearance: 33.8 mL/min (by C-G formula based on SCr of 0.93 mg/dL). Recent Labs  Lab 05/31/23 0411 05/31/23 1026 06/01/23 0337 06/05/23 0404 06/06/23 0428  PROCALCITON  --  2.70  --   --   --   WBC 19.2*  --  18.4* 15.8* 15.4*     Joesph Mussel, DO 06/06/23 1:51 PM [facility]  For contact information, see Amion. If no response to pager, please call PCCM consult pager. After hours, 7PM- 7AM, please call Elink.

## 2023-06-06 NOTE — Progress Notes (Signed)
 Rounding Note    Patient Name: Priscilla Cantrell Date of Encounter: 06/06/2023  [facility] Cardiologist: Audery Blazing, last seen 2014  Subjective   Ongoing SOB  Inpatient Medications    Scheduled Meds:  apixaban   2.5 mg Oral BID   arformoterol   15 mcg Nebulization BID   budesonide  (PULMICORT ) nebulizer solution  0.5 mg Nebulization BID   diltiazem   180 mg Oral Daily   feeding supplement  237 mL Oral BID BM   fluticasone   2 spray Each Nare Daily   furosemide   60 mg Intravenous BID   guaiFENesin   1,200 mg Oral BID   levothyroxine   75 mcg Oral Q0600   methylPREDNISolone  (SOLU-MEDROL ) injection  80 mg Intravenous Q12H   metoprolol  succinate  25 mg Oral Daily   pantoprazole   40 mg Oral BID   polyethylene glycol  17 g Oral Daily   potassium chloride   40 mEq Oral Once   revefenacin   175 mcg Nebulization Daily   senna-docusate  2 tablet Oral BID   venlafaxine   50 mg Oral BID   Continuous Infusions:  levofloxacin  (LEVAQUIN ) IV Stopped (06/05/23 1901)   PRN Meds: acetaminophen  **OR** acetaminophen , levalbuterol , ondansetron  **OR** ondansetron  (ZOFRAN ) IV   Vital Signs    Vitals:   06/06/23 0730 06/06/23 0734 06/06/23 0749 06/06/23 0802  BP:      Pulse:   65   Resp:   20   Temp:      TempSrc:      SpO2: (!) 82% 90% 93% 94%  Weight:      Height:        Intake/Output Summary (Last 24 hours) at 06/06/2023 0839 Last data filed at 06/06/2023 0600 Gross per 24 hour  Intake 629.93 ml  Output 1650 ml  Net -1020.07 ml      06/06/2023    6:30 AM 06/04/2023    4:36 AM 06/03/2023    5:00 AM  Last 3 Weights  Weight (lbs) 108 lb 11.2 oz 119 lb 4.3 oz 117 lb 1 oz  Weight (kg) 49.306 kg 54.1 kg 53.1 kg      Telemetry    SR, PACs - Personally Reviewed  ECG    N/a - Personally Reviewed  Physical Exam   GEN: No acute distress.   Neck: No JVD Cardiac: RRR, no murmurs, rubs, or gallops.  Respiratory: Clear to auscultation bilaterally. GI: Soft, nontender,  non-distended  MS: No edema; No deformity. Neuro:  Nonfocal  Psych: Normal affect   Labs    High Sensitivity Troponin:  No results for input(s): "TROPONINIHS" in the last 720 hours.   Chemistry Recent Labs  Lab 05/31/23 1650 06/01/23 0837 06/03/23 0416 06/04/23 0419 06/05/23 0404 06/06/23 0428  NA 139   < > 139  --  137 137  K 4.2   < > 3.9  --  3.5 3.3*  CL 106   < > 99  --  93* 89*  CO2 24   < > 29  --  32 35*  GLUCOSE 125*   < > 153*  --  151* 200*  BUN 26*   < > 39*  --  33* 36*  CREATININE 0.59   < > 0.80  --  0.82 0.93  CALCIUM  8.4*   < > 8.8*  --  8.6* 8.5*  MG  --    < > 1.9 2.3 2.2 2.4  PROT 5.1*  --   --   --  5.8*  --  ALBUMIN 2.3*  --   --   --  2.7*  --   AST 16  --   --   --  22  --   ALT 24  --   --   --  28  --   ALKPHOS 56  --   --   --  64  --   BILITOT 0.8  --   --   --  1.0  --   GFRNONAA >60   < > >60  --  >60 60*  ANIONGAP 9   < > 11  --  12 13   < > = values in this interval not displayed.    Lipids No results for input(s): "CHOL", "TRIG", "HDL", "LABVLDL", "LDLCALC", "CHOLHDL" in the last 168 hours.  Hematology Recent Labs  Lab 06/01/23 0337 06/05/23 0404 06/06/23 0428  WBC 18.4* 15.8* 15.4*  RBC 3.91 4.32 4.53  HGB 11.2* 12.6 12.9  HCT 36.1 38.6 41.1  MCV 92.3 89.4 90.7  MCH 28.6 29.2 28.5  MCHC 31.0 32.6 31.4  RDW 13.0 12.8 12.9  PLT 240 305 304   Thyroid   Recent Labs  Lab 05/31/23 1026 06/01/23 1502  TSH 0.313*  --   FREET4  --  2.15*    BNP Recent Labs  Lab 05/31/23 1026 06/03/23 0416  BNP 834.8* 433.8*    DDimer No results for input(s): "DDIMER" in the last 168 hours.   Radiology    DG Chest Port 1 View Result Date: 06/05/2023 CLINICAL DATA:  Acute respiratory failure. EXAM: PORTABLE CHEST 1 VIEW COMPARISON:  One-view chest x-ray 06/03/2023 FINDINGS: The heart is enlarged. Arch atherosclerotic changes are again noted at the aortic arch. Aeration of both lungs is markedly improved. Bilateral pleural effusions remain.  Residual bibasilar airspace opacities are worse right than left. Neck is Mild degenerative changes are present in the shoulders and thoracic spine. IMPRESSION: 1. Marked improvement in aeration of both lungs. 2. Residual bibasilar airspace opacities are worse right than left. This most likely reflects atelectasis. Infection is not excluded. 3. Bilateral pleural effusions. Electronically Signed   By: Audree Leas M.D.   On: 06/05/2023 11:29    Cardiac Studies     Patient Profile     Priscilla Cantrell is a 88 y.o. female with a hx of oxygen  dependent COPD with 2LNC, hypothyroidism, HTN, depression,  who is being seen 06/01/2023 for the evaluation of CHF at the request of Dr Alfonse Angle.   Assessment & Plan    1.COPD exacerbation/Pneumonia - per primary team   2. Acute HFpEF - 05/2023 echo: LVEF 60-65%, grade I dd. Mod RV enlargement with normal function, severe pulm HTN PASP 64 mmHg, mod to severe TR, dilated fixed IVC   -CXR bibasilar opacites, small effusions. BNP 835 - received IV lasix  40mg  and then 60mg  yesterday, scheduled for 60mg  bid today. Negative 1 L yesterday and 5.2 L since admission. Weights reported 121-->108 lbs. Slight uptrend in Cr but still WNL. Continue IV lasix  today - consider SGLT2i closer to discharge.    3.Pulmonary HTN - - 2023 echo PASP 40 mmHg.  - PASP 64 by echo - - likely group II and III pulmonary HTN, echo was in setting of volume overload as well with elevated left sided pressures and pneumonia/COPD with significant hypoxia - would repeat echo in the future when euvolemic and acute respiratory issues resolved to reassess PASP  3. New onset afib - new diagnosis this admission, afib with RVR in  setting of pneumonia and COPD exacerbation, also hyperthyroid TSH 0.313 - on dilt 180, eliquis  for stroke prevention     For questions or updates, please contact [facility] Please consult [email or web address] for contact info under         Signed, Armida Lander, MD  06/06/2023, 8:39 AM

## 2023-06-06 NOTE — Progress Notes (Signed)
 PROGRESS NOTE    Priscilla Cantrell  LOV:[id number] DOB: 02-02-1936 DOA: 05/30/2023 PCP: Shelvia Dick, MD   Brief Narrative:  88 y.o. female with medical history significant for CKD stage II, depression, chronic hypoxic respiratory failure due to COPD on 2 L at baseline presented with worsening shortness of breath and cough and was admitted for sepsis due to community-acquired pneumonia along with acute on chronic hypoxic respiratory failure and COPD and CHF exacerbation.  She required BiPAP on presentation.  She has been on IV antibiotics, Solu-Medrol  and Lasix .  Cardiology following.  Patient continues to require high flow nasal cannula oxygen  during the day and BiPAP at night.  Pulmonary consulted as well.  Palliative care consulted for goals of care discussion.  Assessment & Plan:   Acute on chronic hypoxic respiratory failure Sepsis: Present on admission Community-acquired pneumonia Possible COPD exacerbation Acute on chronic diastolic CHF -Respiratory status has not improved.  Required BiPAP on presentation and is still requiring BiPAP at night and 50 L high flow nasal cannula oxygen  during daytime.  Patient has been on IV Levaquin  along with IV Solu-Medrol  and Lasix .  Continue IV Solu-Medrol .  Continue current nebs.  Pulmonary following.  Respiratory panel PCR positive for parainfluenza virus 3 - Cardiology following.  Diuretics as per cardiology.  Echo showed EF of 60 to 65% with grade 1 diastolic dysfunction.  Continue strict input and output.  Daily weight.  Fluid restriction.  Negative balance of 5239.4 cc since admission. - Palliative care following.  Patient remains full code.  Paroxysmal A-fib with RVR - Currently rate controlled mostly.  Patient has been started on Eliquis  age appropriate dosing by cardiology.  Continue metoprolol  succinate and Cardizem   Leukocytosis - Monitor  CKD stage II - Creatinine stable.  Monitor  Hypokalemia - Replace.  Repeat a.m.  labs  Hypothyroidism -Continue levothyroxine   Hypertension -Monitor blood pressure.  Continue Cardizem , metoprolol  succinate, Lasix .  Amlodipine  on hold currently  GERD -Continue Protonix   Physical deconditioning - PT recommending SNF placement.  Will consult TOC once respiratory status improves  Oral thrush - Start nystatin    DVT prophylaxis: Eliquis  Code Status: Full Family Communication: Husband on phone on 06/05/23 Disposition Plan: Status is: Inpatient Remains inpatient appropriate because: Of severity of illness  Consultants: Cardiology.  pulmonary.  Palliative care  Procedures: 2D echo  Antimicrobials:  Anti-infectives (From admission, onward)    Start     Dose/Rate Route Frequency Ordered Stop   06/05/23 1800  levofloxacin  (LEVAQUIN ) IVPB 750 mg        750 mg 100 mL/hr over 90 Minutes Intravenous Every 48 hours 06/05/23 1142     05/31/23 1730  levofloxacin  (LEVAQUIN ) IVPB 500 mg  Status:  Discontinued        500 mg 100 mL/hr over 60 Minutes Intravenous Every 24 hours 05/31/23 1631 06/05/23 1142   05/30/23 0900  doxycycline  (VIBRAMYCIN ) 100 mg in sodium chloride  0.9 % 250 mL IVPB  Status:  Discontinued        100 mg 125 mL/hr over 120 Minutes Intravenous Every 12 hours 05/30/23 0848 05/31/23 1631   05/30/23 0045  aztreonam  (AZACTAM ) 2 g in sodium chloride  0.9 % 100 mL IVPB        2 g 200 mL/hr over 30 Minutes Intravenous  Once 05/30/23 0037 05/30/23 0323   05/30/23 0045  metroNIDAZOLE  (FLAGYL ) IVPB 500 mg        500 mg 100 mL/hr over 60 Minutes Intravenous  Once 05/30/23 0037 05/30/23 0323  05/30/23 0045  vancomycin  (VANCOCIN ) IVPB 1000 mg/200 mL premix        1,000 mg 200 mL/hr over 60 Minutes Intravenous  Once 05/30/23 0037 05/30/23 0323        Subjective: Patient seen and examined at bedside.  Remains short of breath with minimal exertion.  No chest pain, fever or vomiting reported.  Objective: Vitals:   06/06/23 0730 06/06/23 0734 06/06/23 0749  06/06/23 0802  BP:      Pulse:   65   Resp:   20   Temp:      TempSrc:      SpO2: (!) 82% 90% 93% 94%  Weight:      Height:        Intake/Output Summary (Last 24 hours) at 06/06/2023 0818 Last data filed at 06/06/2023 0600 Gross per 24 hour  Intake 629.93 ml  Output 1650 ml  Net -1020.07 ml   Filed Weights   06/03/23 0500 06/04/23 0436 06/06/23 0630  Weight: 53.1 kg 54.1 kg 49.3 kg    Examination:  General: On 50 L high flow nasal cannula oxygen .  No distress.  Looks chronically ill and deconditioned. ENT/neck: No palpable thyromegaly or JVD elevation noted.  Tongue is white and coated respiratory: Decreased breath sounds at bases bilaterally with some crackles  CVS: S1-S2 heard; currently rate controlled abdominal: Soft, nontender, distended; no organomegaly, normal bowel sounds heard Extremities: Trace lower extremity edema; no cyanosis CNS: Awake and alert.  Remains slow to respond.  No obvious focal neurologic deficit.  Lymph: No palpable lymphadenopathy Skin: No obvious ecchymosis/rashes psych: Flat affect currently and not agitated  musculoskeletal: No obvious joint deformity/erythema  Data Reviewed: I have personally reviewed following labs and imaging studies  CBC: Recent Labs  Lab 05/31/23 0411 06/01/23 0337 06/05/23 0404 06/06/23 0428  WBC 19.2* 18.4* 15.8* 15.4*  NEUTROABS  --   --  14.0* 14.1*  HGB 10.8* 11.2* 12.6 12.9  HCT 34.3* 36.1 38.6 41.1  MCV 92.0 92.3 89.4 90.7  PLT 249 240 305 304   Basic Metabolic Panel: Recent Labs  Lab 06/01/23 0837 06/02/23 0348 06/03/23 0416 06/04/23 0419 06/05/23 0404 06/06/23 0428  NA 142 142 139  --  137 137  K 3.8 3.5 3.9  --  3.5 3.3*  CL 101 101 99  --  93* 89*  CO2 29 32 29  --  32 35*  GLUCOSE 142* 160* 153*  --  151* 200*  BUN 31* 38* 39*  --  33* 36*  CREATININE 0.71 0.83 0.80  --  0.82 0.93  CALCIUM  8.8* 8.7* 8.8*  --  8.6* 8.5*  MG  --   --  1.9 2.3 2.2 2.4   GFR: Estimated Creatinine  Clearance: 33.8 mL/min (by C-G formula based on SCr of 0.93 mg/dL). Liver Function Tests: Recent Labs  Lab 05/31/23 1650 06/05/23 0404  AST 16 22  ALT 24 28  ALKPHOS 56 64  BILITOT 0.8 1.0  PROT 5.1* 5.8*  ALBUMIN 2.3* 2.7*   No results for input(s): "LIPASE", "AMYLASE" in the last 168 hours. No results for input(s): "AMMONIA" in the last 168 hours. Coagulation Profile: No results for input(s): "INR", "PROTIME" in the last 168 hours.  Cardiac Enzymes: No results for input(s): "CKTOTAL", "CKMB", "CKMBINDEX", "TROPONINI" in the last 168 hours. BNP (last 3 results) No results for input(s): "PROBNP" in the last 8760 hours. HbA1C: No results for input(s): "HGBA1C" in the last 72 hours. CBG: No results for input(s): "GLUCAP"  in the last 168 hours. Lipid Profile: No results for input(s): "CHOL", "HDL", "LDLCALC", "TRIG", "CHOLHDL", "LDLDIRECT" in the last 72 hours. Thyroid  Function Tests: No results for input(s): "TSH", "T4TOTAL", "FREET4", "T3FREE", "THYROIDAB" in the last 72 hours.  Anemia Panel: No results for input(s): "VITAMINB12", "FOLATE", "FERRITIN", "TIBC", "IRON", "RETICCTPCT" in the last 72 hours. Sepsis Labs: Recent Labs  Lab 05/31/23 1026  PROCALCITON 2.70    Recent Results (from the past 240 hours)  Resp panel by RT-PCR (RSV, Flu A&B, Covid) Anterior Nasal Swab     Status: None   Collection Time: 05/30/23 12:42 AM   Specimen: Anterior Nasal Swab  Result Value Ref Range Status   SARS Coronavirus 2 by RT PCR NEGATIVE NEGATIVE Final    Comment: (NOTE) SARS-CoV-2 target nucleic acids are NOT DETECTED.  The SARS-CoV-2 RNA is generally detectable in upper respiratory specimens during the acute phase of infection. The lowest concentration of SARS-CoV-2 viral copies this assay can detect is 138 copies/mL. A negative result does not preclude SARS-Cov-2 infection and should not be used as the sole basis for treatment or other patient management decisions. A negative  result may occur with  improper specimen collection/handling, submission of specimen other than nasopharyngeal swab, presence of viral mutation(s) within the areas targeted by this assay, and inadequate number of viral copies(<138 copies/mL). A negative result must be combined with clinical observations, patient history, and epidemiological information. The expected result is Negative.  Fact Sheet for Patients:  BloggerCourse.com  Fact Sheet for Healthcare Providers:  SeriousBroker.it  This test is no t yet approved or cleared by the [place]  FDA and  has been authorized for detection and/or diagnosis of SARS-CoV-2 by FDA under an Emergency Use Authorization (EUA). This EUA will remain  in effect (meaning this test can be used) for the duration of the COVID-19 declaration under Section 564(b)(1) of the Act, 21 U.S.C.section 360bbb-3(b)(1), unless the authorization is terminated  or revoked sooner.       Influenza A by PCR NEGATIVE NEGATIVE Final   Influenza B by PCR NEGATIVE NEGATIVE Final    Comment: (NOTE) The Xpert Xpress SARS-CoV-2/FLU/RSV plus assay is intended as an aid in the diagnosis of influenza from Nasopharyngeal swab specimens and should not be used as a sole basis for treatment. Nasal washings and aspirates are unacceptable for Xpert Xpress SARS-CoV-2/FLU/RSV testing.  Fact Sheet for Patients: BloggerCourse.com  Fact Sheet for Healthcare Providers: SeriousBroker.it  This test is not yet approved or cleared by the [place]  FDA and has been authorized for detection and/or diagnosis of SARS-CoV-2 by FDA under an Emergency Use Authorization (EUA). This EUA will remain in effect (meaning this test can be used) for the duration of the COVID-19 declaration under Section 564(b)(1) of the Act, 21 U.S.C. section 360bbb-3(b)(1), unless the authorization is  terminated or revoked.     Resp Syncytial Virus by PCR NEGATIVE NEGATIVE Final    Comment: (NOTE) Fact Sheet for Patients: BloggerCourse.com  Fact Sheet for Healthcare Providers: SeriousBroker.it  This test is not yet approved or cleared by the [place]  FDA and has been authorized for detection and/or diagnosis of SARS-CoV-2 by FDA under an Emergency Use Authorization (EUA). This EUA will remain in effect (meaning this test can be used) for the duration of the COVID-19 declaration under Section 564(b)(1) of the Act, 21 U.S.C. section 360bbb-3(b)(1), unless the authorization is terminated or revoked.  Performed at [facility], [address] [place], [place] [address]   Urine  culture     Status: Abnormal   Collection Time: 05/30/23 12:42 AM   Specimen: Urine, Clean Catch  Result Value Ref Range Status   Specimen Description   Final    URINE, CLEAN CATCH Performed at [facility], [address], [place], [place] [address]    Special Requests   Final    NONE Performed at [facility], [address] [place], [place] [address]    Culture (A)  Final    <10,000 COLONIES/mL INSIGNIFICANT GROWTH Performed at [facility], [address], [place], [place] [address]    Report Status 05/31/2023 FINAL  Final  Blood Culture (routine x 2)     Status: None   Collection Time: 05/30/23  1:22 AM   Specimen: BLOOD RIGHT FOREARM  Result Value Ref Range Status   Specimen Description   Final    BLOOD RIGHT FOREARM Performed at [facility], [address] [place], [place] [address]    Special Requests   Final    BOTTLES DRAWN AEROBIC AND ANAEROBIC Blood Culture adequate volume Performed at [facility], [address], [place], [place] [address]    Culture   Final    NO GROWTH 5 DAYS Performed at [facility], [address] [place], [place] [address]    Report Status 06/04/2023 FINAL  Final  Blood Culture (routine x 2)     Status: None   Collection Time: 05/30/23  1:32 AM   Specimen: BLOOD RIGHT HAND  Result Value Ref Range Status   Specimen Description   Final    BLOOD RIGHT HAND Performed at [facility], [address], [place], [place] [address]    Special Requests   Final    BOTTLES DRAWN AEROBIC AND ANAEROBIC Blood Culture adequate volume Performed at [facility], [address], [place], [place] [address]    Culture   Final    NO GROWTH 5 DAYS Performed at [facility], [address], [place], [place] [address]    Report Status 06/04/2023 FINAL  Final  Respiratory (~20 pathogens) panel by PCR     Status: Abnormal   Collection Time: 06/04/23  1:50 PM   Specimen: Nasopharyngeal Swab; Respiratory  Result Value Ref Range Status   Adenovirus NOT DETECTED NOT DETECTED Final   Coronavirus 229E NOT DETECTED NOT DETECTED Final    Comment: (NOTE) The Coronavirus on the Respiratory Panel, DOES NOT test for the novel  Coronavirus (2019 nCoV)    Coronavirus HKU1 NOT DETECTED NOT DETECTED Final   Coronavirus NL63 NOT DETECTED NOT DETECTED Final   Coronavirus OC43 NOT DETECTED NOT DETECTED Final   Metapneumovirus NOT DETECTED NOT DETECTED Final   Rhinovirus / Enterovirus NOT DETECTED NOT DETECTED Final   Influenza A NOT DETECTED NOT DETECTED Final   Influenza B NOT DETECTED NOT DETECTED Final   Parainfluenza Virus 1 NOT DETECTED NOT DETECTED Final   Parainfluenza Virus 2 NOT DETECTED NOT DETECTED Final   Parainfluenza Virus 3 DETECTED (A) NOT DETECTED Final   Parainfluenza Virus 4 NOT DETECTED NOT DETECTED Final   Respiratory Syncytial Virus NOT DETECTED NOT DETECTED Final   Bordetella pertussis NOT DETECTED NOT DETECTED Final   Bordetella Parapertussis NOT DETECTED NOT DETECTED Final   Chlamydophila pneumoniae NOT DETECTED NOT DETECTED Final   Mycoplasma  pneumoniae NOT DETECTED NOT DETECTED Final    Comment: Performed at [facility],  [address] [place], [place] [address]         Radiology Studies: DG Chest Port 1 View Result Date: 06/05/2023 CLINICAL DATA:  Acute respiratory failure. EXAM: PORTABLE CHEST 1 VIEW COMPARISON:  One-view chest x-ray 06/03/2023 FINDINGS: The heart is enlarged. Arch atherosclerotic changes are again noted at the aortic arch. Aeration of both lungs is markedly improved. Bilateral pleural effusions remain. Residual bibasilar airspace opacities are worse right than left. Neck is Mild degenerative changes are present in the shoulders and thoracic spine. IMPRESSION: 1. Marked improvement in aeration of both lungs. 2. Residual bibasilar airspace opacities are worse right than left. This most likely reflects atelectasis. Infection is not excluded. 3. Bilateral pleural effusions. Electronically Signed   By: Audree Leas M.D.   On: 06/05/2023 11:29        Scheduled Meds:  apixaban   2.5 mg Oral BID   arformoterol   15 mcg Nebulization BID   budesonide  (PULMICORT ) nebulizer solution  0.5 mg Nebulization BID   diltiazem   180 mg Oral Daily   feeding supplement  237 mL Oral BID BM   fluticasone   2 spray Each Nare Daily   furosemide   60 mg Intravenous BID   guaiFENesin   1,200 mg Oral BID   levothyroxine   75 mcg Oral Q0600   methylPREDNISolone  (SOLU-MEDROL ) injection  80 mg Intravenous Q12H   metoprolol  succinate  25 mg Oral Daily   pantoprazole   40 mg Oral BID   polyethylene glycol  17 g Oral Daily   revefenacin   175 mcg Nebulization Daily   senna-docusate  2 tablet Oral BID   venlafaxine   50 mg Oral BID   Continuous Infusions:  levofloxacin  (LEVAQUIN ) IV Stopped (06/05/23 1901)          Audria Leather, MD [facility] 06/06/2023, 8:18 AM

## 2023-06-07 DIAGNOSIS — D72829 Elevated white blood cell count, unspecified: Secondary | ICD-10-CM

## 2023-06-07 DIAGNOSIS — J189 Pneumonia, unspecified organism: Secondary | ICD-10-CM | POA: Diagnosis not present

## 2023-06-07 DIAGNOSIS — J129 Viral pneumonia, unspecified: Secondary | ICD-10-CM | POA: Diagnosis not present

## 2023-06-07 DIAGNOSIS — J9621 Acute and chronic respiratory failure with hypoxia: Secondary | ICD-10-CM | POA: Diagnosis not present

## 2023-06-07 DIAGNOSIS — A419 Sepsis, unspecified organism: Secondary | ICD-10-CM | POA: Diagnosis not present

## 2023-06-07 DIAGNOSIS — I5031 Acute diastolic (congestive) heart failure: Secondary | ICD-10-CM | POA: Diagnosis not present

## 2023-06-07 LAB — CBC WITH DIFFERENTIAL/PLATELET
Abs Immature Granulocytes: 0.3 10*3/uL — ABNORMAL HIGH (ref 0.00–0.07)
Basophils Absolute: 0.1 10*3/uL (ref 0.0–0.1)
Basophils Relative: 0 %
Eosinophils Absolute: 0 10*3/uL (ref 0.0–0.5)
Eosinophils Relative: 0 %
HCT: 41.5 % (ref 36.0–46.0)
Hemoglobin: 13.5 g/dL (ref 12.0–15.0)
Immature Granulocytes: 2 %
Lymphocytes Relative: 3 %
Lymphs Abs: 0.4 10*3/uL — ABNORMAL LOW (ref 0.7–4.0)
MCH: 28.8 pg (ref 26.0–34.0)
MCHC: 32.5 g/dL (ref 30.0–36.0)
MCV: 88.5 fL (ref 80.0–100.0)
Monocytes Absolute: 0.4 10*3/uL (ref 0.1–1.0)
Monocytes Relative: 2 %
Neutro Abs: 15.9 10*3/uL — ABNORMAL HIGH (ref 1.7–7.7)
Neutrophils Relative %: 93 %
Platelets: 300 10*3/uL (ref 150–400)
RBC: 4.69 MIL/uL (ref 3.87–5.11)
RDW: 13 % (ref 11.5–15.5)
WBC: 17 10*3/uL — ABNORMAL HIGH (ref 4.0–10.5)
nRBC: 0 % (ref 0.0–0.2)

## 2023-06-07 LAB — BASIC METABOLIC PANEL WITH GFR
Anion gap: 11 (ref 5–15)
BUN: 38 mg/dL — ABNORMAL HIGH (ref 8–23)
CO2: 37 mmol/L — ABNORMAL HIGH (ref 22–32)
Calcium: 8.5 mg/dL — ABNORMAL LOW (ref 8.9–10.3)
Chloride: 91 mmol/L — ABNORMAL LOW (ref 98–111)
Creatinine, Ser: 0.76 mg/dL (ref 0.44–1.00)
GFR, Estimated: >60 mL/min (ref >60)
Glucose, Bld: 209 mg/dL — ABNORMAL HIGH (ref 70–99)
Potassium: 3.4 mmol/L — ABNORMAL LOW (ref 3.5–5.1)
Sodium: 139 mmol/L (ref 135–145)

## 2023-06-07 LAB — LEGIONELLA PNEUMOPHILA SEROGP 1 UR AG: L. pneumophila Serogp 1 Ur Ag: NEGATIVE

## 2023-06-07 LAB — MAGNESIUM: Magnesium: 2.3 mg/dL (ref 1.7–2.4)

## 2023-06-07 MED ORDER — POTASSIUM CHLORIDE CRYS ER 20 MEQ PO TBCR
40.0000 meq | EXTENDED_RELEASE_TABLET | Freq: Four times a day (QID) | ORAL | Status: AC
  Administered 2023-06-07 (×2): 40 meq via ORAL
  Filled 2023-06-07 (×2): qty 2

## 2023-06-07 MED ORDER — DILTIAZEM HCL ER COATED BEADS 240 MG PO CP24
240.0000 mg | ORAL_CAPSULE | Freq: Every day | ORAL | Status: DC
  Administered 2023-06-07 – 2023-06-17 (×10): 240 mg via ORAL
  Filled 2023-06-07 (×11): qty 1

## 2023-06-07 MED ORDER — METOPROLOL TARTRATE 5 MG/5ML IV SOLN: 2.5000 mg | Freq: Once | INTRAVENOUS | Status: DC

## 2023-06-07 MED ORDER — BENZOCAINE 10 % MT GEL
Freq: Four times a day (QID) | OROMUCOSAL | Status: DC | PRN
  Filled 2023-06-07 (×2): qty 9.4

## 2023-06-07 MED ORDER — ORAL CARE MOUTH RINSE: 15.0000 mL | OROMUCOSAL | Status: DC | PRN

## 2023-06-07 NOTE — Plan of Care (Signed)

## 2023-06-07 NOTE — Progress Notes (Signed)
 Daily Progress Note   Patient Name: Priscilla Cantrell       Date: 06/07/2023 DOB: 07-04-1935  Age: 88 y.o. MRN#: [id number] Attending Physician: Audria Leather, MD Primary Care Physician: Shelvia Dick, MD Admit Date: 05/30/2023  Reason for Consultation/Follow-up: Establishing goals of care  Subjective:  Awake alert, still on HHFNC, O2 sats 96%, is able to speak in full sentences and for longer time, than what she was able to do at the time of initial consult on 06-05-23. In a chair, she is on 2 L O2 [place] 24/7 at home.   Length of Stay: 8  Current Medications: Scheduled Meds:   apixaban   2.5 mg Oral BID   arformoterol   15 mcg Nebulization BID   budesonide  (PULMICORT ) nebulizer solution  0.5 mg Nebulization BID   diltiazem   240 mg Oral Daily   feeding supplement  237 mL Oral BID BM   fluticasone   2 spray Each Nare Daily   furosemide   60 mg Intravenous BID   guaiFENesin   1,200 mg Oral BID   levothyroxine   75 mcg Oral Q0600   methylPREDNISolone  (SOLU-MEDROL ) injection  80 mg Intravenous Q12H   nystatin   5 mL Oral QID   pantoprazole   40 mg Oral BID   polyethylene glycol  17 g Oral Daily   potassium chloride   40 mEq Oral Q6H   revefenacin   175 mcg Nebulization Daily   senna-docusate  2 tablet Oral BID   venlafaxine   50 mg Oral BID   white petrolatum  1 Application Topical Daily    Continuous Infusions:   PRN Meds: acetaminophen  **OR** acetaminophen , benzocaine, levalbuterol , ondansetron  **OR** ondansetron  (ZOFRAN ) IV, mouth rinse  Physical Exam         Awake alert On HHFNC No distress Regular work of breathing  Vital Signs: BP (!) 124/57 (BP Location: Right Arm)   Pulse 69   Temp 98.4 F (36.9 C)   Resp 20   Ht 5\' 2"  (1.575 m)   Wt 48.2 kg   SpO2 96%   BMI 19.42 kg/m   SpO2: SpO2: 96 % O2 Device: O2 Device: Heated High Flow Nasal Cannula O2 Flow Rate: O2 Flow Rate (L/min): 45 L/min  Intake/output summary:  Intake/Output Summary (Last 24 hours) at 06/07/2023 1448 Last data filed at 06/07/2023 0846 Gross per 24 hour  Intake 600 ml  Output 800 ml  Net -200 ml   LBM: Last BM Date : 06/07/23 Baseline Weight: Weight: 55.3 kg Most recent weight: Weight: 48.2 kg       Palliative Assessment/Data:      Patient Active Problem List   Diagnosis Date Noted   Acute on chronic respiratory failure with hypoxia (HCC) 06/06/2023   Pressure injury of skin 06/06/2023   Infection due to parainfluenza virus 3 06/05/2023   Viral pneumonia 06/05/2023   Multifocal pneumonia 06/04/2023   Acute diastolic CHF (congestive heart failure) (HCC) 06/02/2023   Atrial fibrillation with RVR (HCC) 06/02/2023   SVT (supraventricular tachycardia) (HCC) 06/02/2023   COPD exacerbation (HCC) 06/02/2023   Sepsis due to pneumonia (HCC) 05/30/2023   Hip dislocation, right, initial encounter (HCC) 02/10/2023   Sepsis (HCC) 02/22/2022   Acute respiratory failure with hypoxia (HCC) 02/21/2022   Influenza A 02/21/2022   Shortness of breath 12/08/2019   Swelling of lower extremity 12/08/2019   Stress due to family tension 12/08/2019   Chronic cystitis with hematuria 08/15/2019   Urinary urgency 08/15/2019   Multiple closed fractures of ribs of right side 10/05/2018   Healthcare maintenance 10/05/2018   Medication management 07/29/2018   Overweight (BMI 25.0-29.9) 12/09/2017   S/P left KA 12/08/2017   S/P left THA, AA 12/08/2017   Postherpetic neuralgia 03/07/2015   Constipation 02/28/2015   Renal mass 02/28/2015   Acute pyelonephritis 02/20/2015   Oral thrush 02/08/2015   Chronic hypoxemic respiratory failure (HCC) 02/02/2015   SOB (shortness of breath)    Essential hypertension 01/24/2015   UTI (urinary tract infection) 01/09/2015   Pain and swelling of right lower leg  01/09/2015   H/O total hip arthroplasty 11/01/2014   Spinal stenosis, lumbar region, with neurogenic claudication 01/04/2014   Osteoarthritis, hip, bilateral 07/27/2013   Sciatica associated with disorder of lumbosacral spine 04/19/2013   Atrophic vaginitis 01/12/2012   Periodic limb movement disorder 11/24/2011   Hypoxia 05/22/2011   Depression with anxiety 04/15/2011   Chronic venous insufficiency 07/29/2010   Hypothyroidism due to Hashimoto's thyroiditis 01/07/2010   SPINAL STENOSIS 01/07/2010   Allergic rhinitis 08/09/2007   COPD GOLD III B 08/09/2007    Palliative Care Assessment & Plan   Patient Profile:    Assessment:  Acute on chronic hypoxic respiratory failure due to parainfleunza pneumonia; cannot rule out superimposed bacterial pneumonia as well Bibasilar pneumonia/community-acquired with worsening infiltrates Acute HFpEF  Pulmonary HTN New onset A fib.   Recommendations/Plan:  Wants to continue current mode of care, full code, full scope, not opposed to SNF rehab with palliative on discharge.    Code Status:    Code Status Orders  (From admission, onward)           Start     Ordered   05/30/23 0904  Full code  Continuous       Question:  By:  Answer:  Consent: discussion documented in EHR   05/30/23 0904           Code Status History     Date Active Date Inactive Code Status Order ID Comments User Context   02/10/2023 0842 02/17/2023 1820 Full Code [id number]  Heddy Liverpool, NP Inpatient   02/21/2022 1915 02/24/2022 1531 Full Code [id number]  Kenny Peals, MD ED   12/08/2017 1546 12/10/2017 1731 Full Code [id number]  Sheilah Denver Inpatient   02/20/2015 2156 02/22/2015 2019 Full Code [id number]  Danford, Willis Harter, MD Inpatient  01/24/2015 1506 01/27/2015 1549 Full Code [id number]  Otilia Bloch, MD ED   11/01/2014 1145 11/04/2014 1739 Full Code [id number]  Hazle Lites, MD Inpatient   08/26/2013 1354 08/27/2013 0335 Full Code [id number]   Clint Dana, MD HOV      Advance Directive Documentation    Flowsheet Row Most Recent Value  Type of Advance Directive Living will  Pre-existing out of facility DNR order (yellow form or pink MOST form) --  "MOST" Form in Place? --       Prognosis:  Unable to determine  Discharge Planning: Skilled Nursing Facility for rehab with Palliative care service follow-up  Care plan was discussed with patient, call placed but unable to reach her significant other HCPOA agent Mr Cortland Ding.   Thank you for allowing the Palliative Medicine Team to assist in the care of this patient. Mod MDM.      Greater than 50%  of this time was spent counseling and coordinating care related to the above assessment and plan.  Lujean Sake, MD  Please contact Palliative Medicine Team phone at [phone] for questions and concerns.

## 2023-06-07 NOTE — TOC Progression Note (Signed)
 Transition of Care [facility]) - Progression Note    Patient Details  Name: Priscilla Cantrell MRN: [id number] Date of Birth: 11/13/1935  [facility] CM/SW Contact  Gertha Ku, LCSW Phone Number: 06/07/2023, 12:09 PM  Clinical Narrative:     CSW met with the pt to discuss recommendations for rehab. CSW explained the process, noting that insurance authorization would be required. The pt reported she has previously been to rehab at [facility]. She is somewhat hesitant and would like to speak with her spouse before making a decision. CSW will follow up after the pt has had an opportunity to discuss it.   Barriers to Discharge: Continued Medical Work up  Expected Discharge Plan and Services       Living arrangements for the past 2 months: Single Family Home                                       Social Determinants of Health (SDOH) Interventions SDOH Screenings   Food Insecurity: No Food Insecurity (05/30/2023)  Housing: Low Risk  (05/30/2023)  Transportation Needs: No Transportation Needs (05/30/2023)  Utilities: Not At Risk (05/30/2023)  Alcohol  Screen: Low Risk  (02/01/2020)  Depression (PHQ2-9): Medium Risk (04/22/2023)  Financial Resource Strain: Low Risk  (03/19/2022)  Physical Activity: Sufficiently Active (03/19/2022)  Social Connections: Socially Integrated (05/30/2023)  Stress: No Stress Concern Present (03/19/2022)  Tobacco Use: Medium Risk (05/30/2023)    Readmission Risk Interventions     No data to display

## 2023-06-07 NOTE — Progress Notes (Signed)
   06/07/23 2242  BiPAP/CPAP/SIPAP  $ Non-Invasive Ventilator  Non-Invasive Vent Subsequent  BiPAP/CPAP/SIPAP Pt Type Adult  BiPAP/CPAP/SIPAP SERVO  Mask Type Full face mask  Dentures removed? Yes - Placed in denture cup  Mask Size Small  Set Rate 15 breaths/min  Respiratory Rate 20 breaths/min  IPAP 14 cmH20  EPAP 8 cmH2O  Pressure Support 8 cmH20  PEEP 6 cmH20  FiO2 (%) 40 %  Minute Ventilation 10  Leak 65  Peak Inspiratory Pressure (PIP) 30  Tidal Volume (Vt) 490  Patient Home Machine No  Patient Home Mask No  Patient Home Tubing No  Auto Titrate No  Press High Alarm 30 cmH2O  Press Low Alarm 5 cmH2O  CPAP/SIPAP surface wiped down Yes  Device Plugged into RED Power Outlet Yes  Oxygen  Percent 40 %  BiPAP/CPAP /SiPAP Vitals  Resp 19  SpO2 93 %  Bilateral Breath Sounds Clear;Diminished  MEWS Score/Color  MEWS Score 0  MEWS Score Color Green

## 2023-06-07 NOTE — Progress Notes (Signed)
 Rounding Note    Patient Name: Priscilla Cantrell Date of Encounter: 06/07/2023  [facility] Cardiologist: New  Subjective   Ongoing SOB  Inpatient Medications    Scheduled Meds:  apixaban   2.5 mg Oral BID   arformoterol   15 mcg Nebulization BID   budesonide  (PULMICORT ) nebulizer solution  0.5 mg Nebulization BID   diltiazem   180 mg Oral Daily   feeding supplement  237 mL Oral BID BM   fluticasone   2 spray Each Nare Daily   furosemide   60 mg Intravenous BID   guaiFENesin   1,200 mg Oral BID   levothyroxine   75 mcg Oral Q0600   methylPREDNISolone  (SOLU-MEDROL ) injection  80 mg Intravenous Q12H   metoprolol  succinate  25 mg Oral Daily   nystatin   5 mL Oral QID   pantoprazole   40 mg Oral BID   polyethylene glycol  17 g Oral Daily   revefenacin   175 mcg Nebulization Daily   senna-docusate  2 tablet Oral BID   venlafaxine   50 mg Oral BID   white petrolatum  1 Application Topical Daily   Continuous Infusions:  levofloxacin  (LEVAQUIN ) IV Stopped (06/05/23 1901)   PRN Meds: acetaminophen  **OR** acetaminophen , levalbuterol , ondansetron  **OR** ondansetron  (ZOFRAN ) IV   Vital Signs    Vitals:   06/06/23 2047 06/07/23 0042 06/07/23 0448 06/07/23 0518  BP: (!) 126/59   133/67  Pulse: 66 73  68  Resp: 20 20  20   Temp: 97.9 F (36.6 C)   98 F (36.7 C)  TempSrc: Oral   Oral  SpO2: 90% 95% 96% 95%  Weight:    48.2 kg  Height:        Intake/Output Summary (Last 24 hours) at 06/07/2023 0837 Last data filed at 06/07/2023 0520 Gross per 24 hour  Intake 590 ml  Output 1450 ml  Net -860 ml      06/07/2023    5:18 AM 06/06/2023    6:30 AM 06/04/2023    4:36 AM  Last 3 Weights  Weight (lbs) 106 lb 3.2 oz 108 lb 11.2 oz 119 lb 4.3 oz  Weight (kg) 48.172 kg 49.306 kg 54.1 kg      Telemetry    SR, PACs, runs of SVT likely atach - Personally Reviewed  ECG    N/a - Personally Reviewed  Physical Exam   GEN: No acute distress.   Neck: No JVD Cardiac: RRR, no  murmurs, rubs, or gallops.  Respiratory: Clear to auscultation bilaterally. GI: Soft, nontender, non-distended  MS: No edema; No deformity. Neuro:  Nonfocal  Psych: Normal affect   Labs    High Sensitivity Troponin:  No results for input(s): "TROPONINIHS" in the last 720 hours.   Chemistry Recent Labs  Lab 05/31/23 1650 06/01/23 0837 06/05/23 0404 06/06/23 0428 06/07/23 0349  NA 139   < > 137 137 139  K 4.2   < > 3.5 3.3* 3.4*  CL 106   < > 93* 89* 91*  CO2 24   < > 32 35* 37*  GLUCOSE 125*   < > 151* 200* 209*  BUN 26*   < > 33* 36* 38*  CREATININE 0.59   < > 0.82 0.93 0.76  CALCIUM  8.4*   < > 8.6* 8.5* 8.5*  MG  --    < > 2.2 2.4 2.3  PROT 5.1*  --  5.8*  --   --   ALBUMIN 2.3*  --  2.7*  --   --  AST 16  --  22  --   --   ALT 24  --  28  --   --   ALKPHOS 56  --  64  --   --   BILITOT 0.8  --  1.0  --   --   GFRNONAA >60   < > >60 60* >60  ANIONGAP 9   < > 12 13 11    < > = values in this interval not displayed.    Lipids No results for input(s): "CHOL", "TRIG", "HDL", "LABVLDL", "LDLCALC", "CHOLHDL" in the last 168 hours.  Hematology Recent Labs  Lab 06/05/23 0404 06/06/23 0428 06/07/23 0349  WBC 15.8* 15.4* 17.0*  RBC 4.32 4.53 4.69  HGB 12.6 12.9 13.5  HCT 38.6 41.1 41.5  MCV 89.4 90.7 88.5  MCH 29.2 28.5 28.8  MCHC 32.6 31.4 32.5  RDW 12.8 12.9 13.0  PLT 305 304 300   Thyroid   Recent Labs  Lab 05/31/23 1026 06/01/23 1502  TSH 0.313*  --   FREET4  --  2.15*    BNP Recent Labs  Lab 05/31/23 1026 06/03/23 0416  BNP 834.8* 433.8*    DDimer No results for input(s): "DDIMER" in the last 168 hours.   Radiology    No results found.  Cardiac Studies     Patient Profile     Priscilla Cantrell is a 88 y.o. female with a hx of oxygen  dependent COPD with 2LNC, hypothyroidism, HTN, depression, who is being seen 06/01/2023 for the evaluation of CHF at the request of Dr Alfonse Angle.   Assessment & Plan    1.COPD exacerbation/Pneumonia - per primary  team     2. Acute HFpEF - 05/2023 echo: LVEF 60-65%, grade I dd. Mod RV enlargement with normal function, severe pulm HTN PASP 64 mmHg, mod to severe TR, dilated fixed IVC    -CXR bibasilar opacites, small effusions. BNP 835  - she is on IV lasix  60mg  bid. Negative yesterday, negative 6L since admission. Stable renal function.  Weights reported 121-->106 lbs.  - consider SGLT2i closer to discharge.  - looks to be nearing euvolemia, hold IV lasix  after tonights dose and reassess tomorrow AM for continued IV vs oral.      3.Pulmonary HTN - - 2023 echo PASP 40 mmHg.  - PASP 64 by echo - - likely group II and III pulmonary HTN, echo was in setting of volume overload as well with elevated left sided pressures and pneumonia/COPD with significant hypoxia - would repeat echo in the future when euvolemic and acute respiratory issues resolved to reassess PASP.    3. New onset afib - new diagnosis this admission, afib with RVR in setting of pneumonia and COPD exacerbation, also hyperthyroid TSH 0.313 - on dilt 180, eliquis  for stroke prevention. Also on toprol  25. Can d/c toprol  increase dilt to 240mg  daily - tele shows SR, PACs, runs of SVT this morning looks like atach, had ordered dose of IV lopressor  2.5mg  but she has come out of it on her own. . K 3.4 this AM, will order oral replacement.       For questions or updates, please contact [facility] Please consult [email or web address] for contact info under        Signed, Armida Lander, MD  06/07/2023, 8:37 AM

## 2023-06-07 NOTE — Progress Notes (Signed)
 Patient is currently on her Heated High flow at 40L /75%  The BIPAP is only in the room on standby. She is tolerating well at this time with nebulizer treatments ran in line

## 2023-06-07 NOTE — Progress Notes (Signed)
 PROGRESS NOTE    Priscilla Cantrell  OZH:[id number] DOB: October 01, 1935 DOA: 05/30/2023 PCP: Shelvia Dick, MD   Brief Narrative:  88 y.o. female with medical history significant for CKD stage II, depression, chronic hypoxic respiratory failure due to COPD on 2 L at baseline presented with worsening shortness of breath and cough and was admitted for sepsis due to community-acquired pneumonia along with acute on chronic hypoxic respiratory failure and COPD and CHF exacerbation.  She required BiPAP on presentation.  She has been on IV antibiotics, Solu-Medrol  and Lasix .  Cardiology following.  Patient continues to require high flow nasal cannula oxygen  during the day and BiPAP at night.  Pulmonary consulted as well.  Palliative care consulted for goals of care discussion.  Assessment & Plan:   Acute on chronic hypoxic respiratory failure Sepsis: Present on admission Community-acquired pneumonia Possible COPD exacerbation Acute on chronic diastolic CHF -Respiratory status has not improved.  Required BiPAP on presentation and is still requiring BiPAP at night and 45 L high flow nasal cannula oxygen  during daytime.  Patient has been on IV Levaquin  along with IV Solu-Medrol  and Lasix .  Continue IV Solu-Medrol .  Continue current nebs.  Pulmonary following.  Respiratory panel PCR positive for parainfluenza virus 3 - Cardiology following.  Diuretics as per cardiology.  Echo showed EF of 60 to 65% with grade 1 diastolic dysfunction.  Continue strict input and output.  Daily weight.  Fluid restriction.  Negative balance of 6099.4 cc since admission. - Palliative care following.  Patient remains full code.  Paroxysmal A-fib with RVR - Currently rate controlled mostly.  Patient has been started on Eliquis  age appropriate dosing by cardiology.  Continue metoprolol  succinate and Cardizem   Leukocytosis - Monitor  CKD stage II - Creatinine stable.  Monitor  Hypokalemia - Replace.  Repeat a.m.  labs  Hypothyroidism -Continue levothyroxine   Hypertension -Monitor blood pressure.  Continue Cardizem , metoprolol  succinate, Lasix .  Amlodipine  on hold currently  GERD -Continue Protonix   Physical deconditioning - PT recommending SNF placement.  Will consult TOC once respiratory status improves  Oral thrush - Start nystatin    DVT prophylaxis: Eliquis  Code Status: Full Family Communication: Husband on phone on 06/05/23 Disposition Plan: Status is: Inpatient Remains inpatient appropriate because: Of severity of illness  Consultants: Cardiology.  pulmonary.  Palliative care  Procedures: 2D echo  Antimicrobials:  Anti-infectives (From admission, onward)    Start     Dose/Rate Route Frequency Ordered Stop   06/05/23 1800  levofloxacin  (LEVAQUIN ) IVPB 750 mg        750 mg 100 mL/hr over 90 Minutes Intravenous Every 48 hours 06/05/23 1142     05/31/23 1730  levofloxacin  (LEVAQUIN ) IVPB 500 mg  Status:  Discontinued        500 mg 100 mL/hr over 60 Minutes Intravenous Every 24 hours 05/31/23 1631 06/05/23 1142   05/30/23 0900  doxycycline  (VIBRAMYCIN ) 100 mg in sodium chloride  0.9 % 250 mL IVPB  Status:  Discontinued        100 mg 125 mL/hr over 120 Minutes Intravenous Every 12 hours 05/30/23 0848 05/31/23 1631   05/30/23 0045  aztreonam  (AZACTAM ) 2 g in sodium chloride  0.9 % 100 mL IVPB        2 g 200 mL/hr over 30 Minutes Intravenous  Once 05/30/23 0037 05/30/23 0323   05/30/23 0045  metroNIDAZOLE  (FLAGYL ) IVPB 500 mg        500 mg 100 mL/hr over 60 Minutes Intravenous  Once 05/30/23 0037 05/30/23 0323  05/30/23 0045  vancomycin  (VANCOCIN ) IVPB 1000 mg/200 mL premix        1,000 mg 200 mL/hr over 60 Minutes Intravenous  Once 05/30/23 0037 05/30/23 0323        Subjective: Patient seen and examined at bedside.  Still short of breath with slight exertion.  Denies any current chest pain, nausea vomiting or fever.   Objective: Vitals:   06/06/23 2047 06/07/23 0042  06/07/23 0448 06/07/23 0518  BP: (!) 126/59   133/67  Pulse: 66 73  68  Resp: 20 20  20   Temp: 97.9 F (36.6 C)   98 F (36.7 C)  TempSrc: Oral   Oral  SpO2: 90% 95% 96% 95%  Weight:    48.2 kg  Height:        Intake/Output Summary (Last 24 hours) at 06/07/2023 0747 Last data filed at 06/07/2023 0520 Gross per 24 hour  Intake 590 ml  Output 1450 ml  Net -860 ml   Filed Weights   06/04/23 0436 06/06/23 0630 06/07/23 0518  Weight: 54.1 kg 49.3 kg 48.2 kg    Examination:  General: On 45 L high flow nasal cannula oxygen .  No acute distress.  Looks chronically ill and deconditioned. ENT/neck: No elevated JVD or palpable neck masses noted  respiratory: Bilateral decreased breath sounds at bases with scattered crackles  CVS: Rate mostly controlled; S1 and S2 are heard  abdominal: Soft, nontender, remains slightly distended; no organomegaly, bowel sounds are heard Extremities: No clubbing; mild lower extremity edema present CNS: Alert and oriented.  Still slow to respond.  No obvious focal neurologic deficit.  Lymph: No lymphadenopathy palpable  skin: No obvious ecchymosis/petechiae  psych: Not agitated with flat affect   musculoskeletal: No obvious joint swelling/tenderness  Data Reviewed: I have personally reviewed following labs and imaging studies  CBC: Recent Labs  Lab 06/01/23 0337 06/05/23 0404 06/06/23 0428 06/07/23 0349  WBC 18.4* 15.8* 15.4* 17.0*  NEUTROABS  --  14.0* 14.1* 15.9*  HGB 11.2* 12.6 12.9 13.5  HCT 36.1 38.6 41.1 41.5  MCV 92.3 89.4 90.7 88.5  PLT 240 305 304 300   Basic Metabolic Panel: Recent Labs  Lab 06/02/23 0348 06/03/23 0416 06/04/23 0419 06/05/23 0404 06/06/23 0428 06/07/23 0349  NA 142 139  --  137 137 139  K 3.5 3.9  --  3.5 3.3* 3.4*  CL 101 99  --  93* 89* 91*  CO2 32 29  --  32 35* 37*  GLUCOSE 160* 153*  --  151* 200* 209*  BUN 38* 39*  --  33* 36* 38*  CREATININE 0.83 0.80  --  0.82 0.93 0.76  CALCIUM  8.7* 8.8*  --  8.6*  8.5* 8.5*  MG  --  1.9 2.3 2.2 2.4 2.3   GFR: Estimated Creatinine Clearance: 38.4 mL/min (by C-G formula based on SCr of 0.76 mg/dL). Liver Function Tests: Recent Labs  Lab 05/31/23 1650 06/05/23 0404  AST 16 22  ALT 24 28  ALKPHOS 56 64  BILITOT 0.8 1.0  PROT 5.1* 5.8*  ALBUMIN 2.3* 2.7*   No results for input(s): "LIPASE", "AMYLASE" in the last 168 hours. No results for input(s): "AMMONIA" in the last 168 hours. Coagulation Profile: No results for input(s): "INR", "PROTIME" in the last 168 hours.  Cardiac Enzymes: No results for input(s): "CKTOTAL", "CKMB", "CKMBINDEX", "TROPONINI" in the last 168 hours. BNP (last 3 results) No results for input(s): "PROBNP" in the last 8760 hours. HbA1C: No results for input(s): "  HGBA1C" in the last 72 hours. CBG: No results for input(s): "GLUCAP" in the last 168 hours. Lipid Profile: No results for input(s): "CHOL", "HDL", "LDLCALC", "TRIG", "CHOLHDL", "LDLDIRECT" in the last 72 hours. Thyroid  Function Tests: No results for input(s): "TSH", "T4TOTAL", "FREET4", "T3FREE", "THYROIDAB" in the last 72 hours.  Anemia Panel: No results for input(s): "VITAMINB12", "FOLATE", "FERRITIN", "TIBC", "IRON", "RETICCTPCT" in the last 72 hours. Sepsis Labs: Recent Labs  Lab 05/31/23 1026  PROCALCITON 2.70    Recent Results (from the past 240 hours)  Resp panel by RT-PCR (RSV, Flu A&B, Covid) Anterior Nasal Swab     Status: None   Collection Time: 05/30/23 12:42 AM   Specimen: Anterior Nasal Swab  Result Value Ref Range Status   SARS Coronavirus 2 by RT PCR NEGATIVE NEGATIVE Final    Comment: (NOTE) SARS-CoV-2 target nucleic acids are NOT DETECTED.  The SARS-CoV-2 RNA is generally detectable in upper respiratory specimens during the acute phase of infection. The lowest concentration of SARS-CoV-2 viral copies this assay can detect is 138 copies/mL. A negative result does not preclude SARS-Cov-2 infection and should not be used as the sole  basis for treatment or other patient management decisions. A negative result may occur with  improper specimen collection/handling, submission of specimen other than nasopharyngeal swab, presence of viral mutation(s) within the areas targeted by this assay, and inadequate number of viral copies(<138 copies/mL). A negative result must be combined with clinical observations, patient history, and epidemiological information. The expected result is Negative.  Fact Sheet for Patients:  BloggerCourse.com  Fact Sheet for Healthcare Providers:  SeriousBroker.it  This test is no t yet approved or cleared by the [place]  FDA and  has been authorized for detection and/or diagnosis of SARS-CoV-2 by FDA under an Emergency Use Authorization (EUA). This EUA will remain  in effect (meaning this test can be used) for the duration of the COVID-19 declaration under Section 564(b)(1) of the Act, 21 U.S.C.section 360bbb-3(b)(1), unless the authorization is terminated  or revoked sooner.       Influenza A by PCR NEGATIVE NEGATIVE Final   Influenza B by PCR NEGATIVE NEGATIVE Final    Comment: (NOTE) The Xpert Xpress SARS-CoV-2/FLU/RSV plus assay is intended as an aid in the diagnosis of influenza from Nasopharyngeal swab specimens and should not be used as a sole basis for treatment. Nasal washings and aspirates are unacceptable for Xpert Xpress SARS-CoV-2/FLU/RSV testing.  Fact Sheet for Patients: BloggerCourse.com  Fact Sheet for Healthcare Providers: SeriousBroker.it  This test is not yet approved or cleared by the [place]  FDA and has been authorized for detection and/or diagnosis of SARS-CoV-2 by FDA under an Emergency Use Authorization (EUA). This EUA will remain in effect (meaning this test can be used) for the duration of the COVID-19 declaration under Section 564(b)(1) of the Act,  21 U.S.C. section 360bbb-3(b)(1), unless the authorization is terminated or revoked.     Resp Syncytial Virus by PCR NEGATIVE NEGATIVE Final    Comment: (NOTE) Fact Sheet for Patients: BloggerCourse.com  Fact Sheet for Healthcare Providers: SeriousBroker.it  This test is not yet approved or cleared by the [place]  FDA and has been authorized for detection and/or diagnosis of SARS-CoV-2 by FDA under an Emergency Use Authorization (EUA). This EUA will remain in effect (meaning this test can be used) for the duration of the COVID-19 declaration under Section 564(b)(1) of the Act, 21 U.S.C. section 360bbb-3(b)(1), unless the authorization is terminated or revoked.  Performed at [facility], [address], [place], [place] [address]   Urine culture     Status: Abnormal   Collection Time: 05/30/23 12:42 AM   Specimen: Urine, Clean Catch  Result Value Ref Range Status   Specimen Description   Final    URINE, CLEAN CATCH Performed at [facility], [address], [place], [place] [address]    Special Requests   Final    NONE Performed at [facility], [address], [place], [place] [address]    Culture (A)  Final    <10,000 COLONIES/mL INSIGNIFICANT GROWTH Performed at [facility], [address], [place], [place] [address]    Report Status 05/31/2023 FINAL  Final  Blood Culture (routine x 2)     Status: None   Collection Time: 05/30/23  1:22 AM   Specimen: BLOOD RIGHT FOREARM  Result Value Ref Range Status   Specimen Description   Final    BLOOD RIGHT FOREARM Performed at [facility], [address], [place], [place] [address]    Special Requests   Final    BOTTLES DRAWN AEROBIC AND ANAEROBIC Blood Culture adequate volume Performed at [facility], [address] [place], [place] [address]    Culture   Final     NO GROWTH 5 DAYS Performed at [facility], [address], [place], [place] [address]    Report Status 06/04/2023 FINAL  Final  Blood Culture (routine x 2)     Status: None   Collection Time: 05/30/23  1:32 AM   Specimen: BLOOD RIGHT HAND  Result Value Ref Range Status   Specimen Description   Final    BLOOD RIGHT HAND Performed at [facility], [address] [place], [place] [address]    Special Requests   Final    BOTTLES DRAWN AEROBIC AND ANAEROBIC Blood Culture adequate volume Performed at [facility], [address], [place], [place] [address]    Culture   Final    NO GROWTH 5 DAYS Performed at [facility], [address] [place], [place] [address]    Report Status 06/04/2023 FINAL  Final  Respiratory (~20 pathogens) panel by PCR     Status: Abnormal   Collection Time: 06/04/23  1:50 PM   Specimen: Nasopharyngeal Swab; Respiratory  Result Value Ref Range Status   Adenovirus NOT DETECTED NOT DETECTED Final   Coronavirus 229E NOT DETECTED NOT DETECTED Final    Comment: (NOTE) The Coronavirus on the Respiratory Panel, DOES NOT test for the novel  Coronavirus (2019 nCoV)    Coronavirus HKU1 NOT DETECTED NOT DETECTED Final   Coronavirus NL63 NOT DETECTED NOT DETECTED Final   Coronavirus OC43 NOT DETECTED NOT DETECTED Final   Metapneumovirus NOT DETECTED NOT DETECTED Final   Rhinovirus / Enterovirus NOT DETECTED NOT DETECTED Final   Influenza A NOT DETECTED NOT DETECTED Final   Influenza B NOT DETECTED NOT DETECTED Final   Parainfluenza Virus 1 NOT DETECTED NOT DETECTED Final   Parainfluenza Virus 2 NOT DETECTED NOT DETECTED Final   Parainfluenza Virus 3 DETECTED (A) NOT DETECTED Final   Parainfluenza Virus 4 NOT DETECTED NOT DETECTED Final   Respiratory Syncytial Virus NOT DETECTED NOT DETECTED Final   Bordetella pertussis NOT DETECTED NOT DETECTED Final   Bordetella Parapertussis NOT DETECTED NOT DETECTED Final   Chlamydophila  pneumoniae NOT DETECTED NOT DETECTED Final   Mycoplasma pneumoniae NOT DETECTED NOT  DETECTED Final    Comment: Performed at [facility], [address] [place], [place] [address]         Radiology Studies: No results found.       Scheduled Meds:  apixaban   2.5 mg Oral BID   arformoterol   15 mcg Nebulization BID   budesonide  (PULMICORT ) nebulizer solution  0.5 mg Nebulization BID   diltiazem   180 mg Oral Daily   feeding supplement  237 mL Oral BID BM   fluticasone   2 spray Each Nare Daily   furosemide   60 mg Intravenous BID   guaiFENesin   1,200 mg Oral BID   levothyroxine   75 mcg Oral Q0600   methylPREDNISolone  (SOLU-MEDROL ) injection  80 mg Intravenous Q12H   metoprolol  succinate  25 mg Oral Daily   nystatin   5 mL Oral QID   pantoprazole   40 mg Oral BID   polyethylene glycol  17 g Oral Daily   revefenacin   175 mcg Nebulization Daily   senna-docusate  2 tablet Oral BID   venlafaxine   50 mg Oral BID   white petrolatum  1 Application Topical Daily   Continuous Infusions:  levofloxacin  (LEVAQUIN ) IV Stopped (06/05/23 1901)          Audria Leather, MD [facility] 06/07/2023, 7:47 AM

## 2023-06-07 NOTE — Progress Notes (Signed)
 NAME:  Priscilla Cantrell, MRN:  [id number], DOB:  06/17/1935, LOS: 8 ADMISSION DATE:  05/30/2023, CONSULTATION DATE:  06/07/2023  REFERRING MD:  Livia Riffle , CHIEF COMPLAINT: Respiratory distress  History of Present Illness:  88 year old woman with severe COPD and chronic respiratory failure with hypoxia.  At baseline she uses 2 L at rest and 4 L with exertion.  She was admitted with 2 weeks of shortness of breath and cough with green sputum production and hypoxia to 70% on her baseline 2 L oxygen .  Chest x-ray showed bibasilar infiltrates, urine strep antigen was negative.  She was febrile tachycardic with leukocytosis. She was started on Levaquin  treated with BiPAP Course was complicated by new onset atrial fibrillation with RVR and elevated BNP requiring diuretics.  Cardiology was consulted, echo showed normal LVEF with moderate RV enlargement, moderate to severe TR.  Her weight decreased 5 pounds with diuresis but she continued to be more hypoxic and has required HHFNC. Hence PCCM consulted  Pertinent  Medical History  hypothyroidism, HTN, depression,  PFT 10/29/18 >> FEV1 1.05 (68%), FEV1% 52, TLC 4.29 (96%), DLCO 44%   Significant Hospital Events: Including procedures, antibiotic start and stop dates in addition to other pertinent events   4/30 On HHFNC 35 L / 90% 5/4 on HHFNC 45L, 70%, coming down  Interim History / Subjective:  She denies complaints.   Objective   Blood pressure 133/67, pulse 68, temperature 98 F (36.7 C), temperature source Oral, resp. rate 20, height 5\' 2"  (1.575 m), weight 48.2 kg, SpO2 96%.    FiO2 (%):  [45 %-70 %] 70 %   Intake/Output Summary (Last 24 hours) at 06/07/2023 1028 Last data filed at 06/07/2023 0520 Gross per 24 hour  Intake 590 ml  Output 1450 ml  Net -860 ml   Filed Weights   06/04/23 0436 06/06/23 0630 06/07/23 0518  Weight: 54.1 kg 49.3 kg 48.2 kg    Examination: Gen: chronically ill, frail-appearing elderly woman sitting up in the  recliner in NAD HEENT: [place]/AT, eyes anicteric Lungs: breathing comfortably on HHFNC, faint basilar rhales, no accessory muscle use Cardiovascular: S1S2, RRR Abdomen: soft, NT Musculoskeletal: no edema or cyanosis Neuro: awake, alert, moving all extremities but globally weak   WBC 17  RVP + parainfluenza  Resolved Hospital Problem list     Assessment & Plan:  Acute on chronic hypoxic respiratory failure due to parainfleunza pneumonia; cannot rule out superimposed bacterial pneumonia as well Bibasilar pneumonia/community-acquired with worsening infiltrates Her S/O was also sick with similar symptoms Worsening leukocytosis likely attributable to steroids, although should plateau soon - con't HHFNC; weaning down as able -IS, OOB mobility as able -con't steroids -con't triple inhaled therapy- pulmicort , brovana , yupelri  -likely can d/c steroids since this is viral pneumonia and she has had several days, although was lower dose - Anticipate a slow recovery.  Continue wean oxygen  as able.  Unfortunately viral pneumonias are often slow to resolve and she will likely have oxygen  requirements for a while, potentially several weeks  Prognosis guarded given high oxygen  requirements but hopeful for recovery. Need to avoid deconditioning as much as possible.   Best Practice (right click and "Reselect all SmartList Selections" daily)    Code Status:  full code Last date of multidisciplinary goals of care discussion [NA]  Labs   CBC: Recent Labs  Lab 06/01/23 0337 06/05/23 0404 06/06/23 0428 06/07/23 0349  WBC 18.4* 15.8* 15.4* 17.0*  NEUTROABS  --  14.0* 14.1* 15.9*  HGB 11.2*  12.6 12.9 13.5  HCT 36.1 38.6 41.1 41.5  MCV 92.3 89.4 90.7 88.5  PLT 240 305 304 300    Basic Metabolic Panel: Recent Labs  Lab 06/02/23 0348 06/03/23 0416 06/04/23 0419 06/05/23 0404 06/06/23 0428 06/07/23 0349  NA 142 139  --  137 137 139  K 3.5 3.9  --  3.5 3.3* 3.4*  CL 101 99  --  93* 89* 91*   CO2 32 29  --  32 35* 37*  GLUCOSE 160* 153*  --  151* 200* 209*  BUN 38* 39*  --  33* 36* 38*  CREATININE 0.83 0.80  --  0.82 0.93 0.76  CALCIUM  8.7* 8.8*  --  8.6* 8.5* 8.5*  MG  --  1.9 2.3 2.2 2.4 2.3   GFR: Estimated Creatinine Clearance: 38.4 mL/min (by C-G formula based on SCr of 0.76 mg/dL). Recent Labs  Lab 06/01/23 0337 06/05/23 0404 06/06/23 0428 06/07/23 0349  WBC 18.4* 15.8* 15.4* 17.0*     Joesph Mussel, DO 06/07/23 11:24 AM [facility]  For contact information, see Amion. If no response to pager, please call PCCM consult pager. After hours, 7PM- 7AM, please call Elink.

## 2023-06-08 DIAGNOSIS — A419 Sepsis, unspecified organism: Secondary | ICD-10-CM | POA: Diagnosis not present

## 2023-06-08 DIAGNOSIS — J189 Pneumonia, unspecified organism: Secondary | ICD-10-CM | POA: Diagnosis not present

## 2023-06-08 MED ORDER — LIDOCAINE VISCOUS HCL 2 % MT SOLN
15.0000 mL | Freq: Once | OROMUCOSAL | Status: AC
  Administered 2023-06-08: 15 mL via OROMUCOSAL
  Filled 2023-06-08: qty 15

## 2023-06-08 MED ORDER — METOPROLOL SUCCINATE ER 25 MG PO TB24
25.0000 mg | ORAL_TABLET | Freq: Every day | ORAL | Status: DC
  Administered 2023-06-08 – 2023-06-17 (×9): 25 mg via ORAL
  Filled 2023-06-08 (×10): qty 1

## 2023-06-08 NOTE — Progress Notes (Signed)
 PROGRESS NOTE    Priscilla Cantrell  ONG:[id number] DOB: 1935/07/31 DOA: 05/30/2023 PCP: Shelvia Dick, MD   Brief Narrative:  88 y.o. female with medical history significant for CKD stage II, depression, chronic hypoxic respiratory failure due to COPD on 2 L at baseline presented with worsening shortness of breath and cough and was admitted for sepsis due to community-acquired pneumonia along with acute on chronic hypoxic respiratory failure and COPD and CHF exacerbation.  She required BiPAP on presentation.  She has been on IV antibiotics, Solu-Medrol  and Lasix .  Cardiology following.  Patient continues to require high flow nasal cannula oxygen  during the day and BiPAP at night.  Pulmonary consulted as well.  Palliative care consulted for goals of care discussion.  Assessment & Plan:   Acute on chronic hypoxic respiratory failure Sepsis: Present on admission Community-acquired pneumonia Possible COPD exacerbation Acute on chronic diastolic CHF - Currently still requiring BiPAP at night and 40 L high flow nasal cannula oxygen  during daytime.  Completed a course of IV Levaquin  and discontinued on 06/07/2023.  Continue IV Solu-Medrol .  Continue current nebs.  Pulmonary following.  Respiratory panel PCR positive for parainfluenza virus 3 - Cardiology following.  Diuretics as per cardiology.  Echo showed EF of 60 to 65% with grade 1 diastolic dysfunction.  Continue strict input and output.  Daily weight.  Fluid restriction.  Negative balance of 6229.4 cc since admission. - Palliative care following.  Patient remains full code.  Paroxysmal A-fib with RVR - Currently rate controlled mostly.  Patient has been started on Eliquis  age appropriate dosing by cardiology.  Continue metoprolol  succinate and Cardizem   Leukocytosis - Monitor  CKD stage II - Creatinine stable.  Monitor  Hypokalemia - Labs pending today  Hypothyroidism -Continue levothyroxine   Hypertension -Monitor blood pressure.   Continue Cardizem , metoprolol  succinate, Lasix .  Amlodipine  on hold currently  GERD -Continue Protonix   Physical deconditioning - PT recommending SNF placement.  Will consult TOC once respiratory status improves  Oral thrush - Continue nystatin    DVT prophylaxis: Eliquis  Code Status: Full Family Communication: Husband on phone on 06/05/23 Disposition Plan: Status is: Inpatient Remains inpatient appropriate because: Of severity of illness  Consultants: Cardiology.  pulmonary.  Palliative care  Procedures: 2D echo  Antimicrobials:  Anti-infectives (From admission, onward)    Start     Dose/Rate Route Frequency Ordered Stop   06/05/23 1800  levofloxacin  (LEVAQUIN ) IVPB 750 mg  Status:  Discontinued        750 mg 100 mL/hr over 90 Minutes Intravenous Every 48 hours 06/05/23 1142 06/07/23 1135   05/31/23 1730  levofloxacin  (LEVAQUIN ) IVPB 500 mg  Status:  Discontinued        500 mg 100 mL/hr over 60 Minutes Intravenous Every 24 hours 05/31/23 1631 06/05/23 1142   05/30/23 0900  doxycycline  (VIBRAMYCIN ) 100 mg in sodium chloride  0.9 % 250 mL IVPB  Status:  Discontinued        100 mg 125 mL/hr over 120 Minutes Intravenous Every 12 hours 05/30/23 0848 05/31/23 1631   05/30/23 0045  aztreonam  (AZACTAM ) 2 g in sodium chloride  0.9 % 100 mL IVPB        2 g 200 mL/hr over 30 Minutes Intravenous  Once 05/30/23 0037 05/30/23 0323   05/30/23 0045  metroNIDAZOLE  (FLAGYL ) IVPB 500 mg        500 mg 100 mL/hr over 60 Minutes Intravenous  Once 05/30/23 0037 05/30/23 0323   05/30/23 0045  vancomycin  (VANCOCIN ) IVPB 1000 mg/200  mL premix        1,000 mg 200 mL/hr over 60 Minutes Intravenous  Once 05/30/23 0037 05/30/23 0323        Subjective: Patient seen and examined at bedside.  No fever, agitation, seizures or vomiting reported.   Objective: Vitals:   06/08/23 0310 06/08/23 0429 06/08/23 0500 06/08/23 0700  BP:  123/70    Pulse: 61 67    Resp: 16 (!) 21  20  Temp:  97.7 F (36.5  C)    TempSrc:  Oral    SpO2: 91% 94%    Weight:   50.6 kg   Height:        Intake/Output Summary (Last 24 hours) at 06/08/2023 0738 Last data filed at 06/08/2023 0600 Gross per 24 hour  Intake 840 ml  Output 970 ml  Net -130 ml   Filed Weights   06/06/23 0630 06/07/23 0518 06/08/23 0500  Weight: 49.3 kg 48.2 kg 50.6 kg    Examination:  General: On 40 L high flow nasal cannula oxygen .  No distress.  Looks chronically ill and deconditioned. ENT/neck: No palpable thyromegaly or JVD elevation noted  respiratory: Decreased breath sounds at bases bilaterally with some wheezing CVS: Currently rate controlled; S1 and S2 heard  abdominal: Soft, nontender, distended slightly; no organomegaly, bowel sounds are heard normally Extremities: Lower extremity trace edema present bilaterally; no cyanosis  CNS: Wakes up slightly, remains slow to respond.  No focal deficits  lymph: No palpable lymphadenopathy skin: No rashes/lesions  psych: Mostly flat affect with no signs of agitation  musculoskeletal: No obvious joint erythema/deformity   data Reviewed: I have personally reviewed following labs and imaging studies  CBC: Recent Labs  Lab 06/05/23 0404 06/06/23 0428 06/07/23 0349  WBC 15.8* 15.4* 17.0*  NEUTROABS 14.0* 14.1* 15.9*  HGB 12.6 12.9 13.5  HCT 38.6 41.1 41.5  MCV 89.4 90.7 88.5  PLT 305 304 300   Basic Metabolic Panel: Recent Labs  Lab 06/02/23 0348 06/03/23 0416 06/04/23 0419 06/05/23 0404 06/06/23 0428 06/07/23 0349  NA 142 139  --  137 137 139  K 3.5 3.9  --  3.5 3.3* 3.4*  CL 101 99  --  93* 89* 91*  CO2 32 29  --  32 35* 37*  GLUCOSE 160* 153*  --  151* 200* 209*  BUN 38* 39*  --  33* 36* 38*  CREATININE 0.83 0.80  --  0.82 0.93 0.76  CALCIUM  8.7* 8.8*  --  8.6* 8.5* 8.5*  MG  --  1.9 2.3 2.2 2.4 2.3   GFR: Estimated Creatinine Clearance: 39.9 mL/min (by C-G formula based on SCr of 0.76 mg/dL). Liver Function Tests: Recent Labs  Lab 06/05/23 0404   AST 22  ALT 28  ALKPHOS 64  BILITOT 1.0  PROT 5.8*  ALBUMIN 2.7*   No results for input(s): "LIPASE", "AMYLASE" in the last 168 hours. No results for input(s): "AMMONIA" in the last 168 hours. Coagulation Profile: No results for input(s): "INR", "PROTIME" in the last 168 hours.  Cardiac Enzymes: No results for input(s): "CKTOTAL", "CKMB", "CKMBINDEX", "TROPONINI" in the last 168 hours. BNP (last 3 results) No results for input(s): "PROBNP" in the last 8760 hours. HbA1C: No results for input(s): "HGBA1C" in the last 72 hours. CBG: No results for input(s): "GLUCAP" in the last 168 hours. Lipid Profile: No results for input(s): "CHOL", "HDL", "LDLCALC", "TRIG", "CHOLHDL", "LDLDIRECT" in the last 72 hours. Thyroid  Function Tests: No results for input(s): "TSH", "T4TOTAL", "  FREET4", "T3FREE", "THYROIDAB" in the last 72 hours.  Anemia Panel: No results for input(s): "VITAMINB12", "FOLATE", "FERRITIN", "TIBC", "IRON", "RETICCTPCT" in the last 72 hours. Sepsis Labs: No results for input(s): "PROCALCITON", "LATICACIDVEN" in the last 168 hours.   Recent Results (from the past 240 hours)  Resp panel by RT-PCR (RSV, Flu A&B, Covid) Anterior Nasal Swab     Status: None   Collection Time: 05/30/23 12:42 AM   Specimen: Anterior Nasal Swab  Result Value Ref Range Status   SARS Coronavirus 2 by RT PCR NEGATIVE NEGATIVE Final    Comment: (NOTE) SARS-CoV-2 target nucleic acids are NOT DETECTED.  The SARS-CoV-2 RNA is generally detectable in upper respiratory specimens during the acute phase of infection. The lowest concentration of SARS-CoV-2 viral copies this assay can detect is 138 copies/mL. A negative result does not preclude SARS-Cov-2 infection and should not be used as the sole basis for treatment or other patient management decisions. A negative result may occur with  improper specimen collection/handling, submission of specimen other than nasopharyngeal swab, presence of viral  mutation(s) within the areas targeted by this assay, and inadequate number of viral copies(<138 copies/mL). A negative result must be combined with clinical observations, patient history, and epidemiological information. The expected result is Negative.  Fact Sheet for Patients:  BloggerCourse.com  Fact Sheet for Healthcare Providers:  SeriousBroker.it  This test is no t yet approved or cleared by the [place]  FDA and  has been authorized for detection and/or diagnosis of SARS-CoV-2 by FDA under an Emergency Use Authorization (EUA). This EUA will remain  in effect (meaning this test can be used) for the duration of the COVID-19 declaration under Section 564(b)(1) of the Act, 21 U.S.C.section 360bbb-3(b)(1), unless the authorization is terminated  or revoked sooner.       Influenza A by PCR NEGATIVE NEGATIVE Final   Influenza B by PCR NEGATIVE NEGATIVE Final    Comment: (NOTE) The Xpert Xpress SARS-CoV-2/FLU/RSV plus assay is intended as an aid in the diagnosis of influenza from Nasopharyngeal swab specimens and should not be used as a sole basis for treatment. Nasal washings and aspirates are unacceptable for Xpert Xpress SARS-CoV-2/FLU/RSV testing.  Fact Sheet for Patients: BloggerCourse.com  Fact Sheet for Healthcare Providers: SeriousBroker.it  This test is not yet approved or cleared by the [place]  FDA and has been authorized for detection and/or diagnosis of SARS-CoV-2 by FDA under an Emergency Use Authorization (EUA). This EUA will remain in effect (meaning this test can be used) for the duration of the COVID-19 declaration under Section 564(b)(1) of the Act, 21 U.S.C. section 360bbb-3(b)(1), unless the authorization is terminated or revoked.     Resp Syncytial Virus by PCR NEGATIVE NEGATIVE Final    Comment: (NOTE) Fact Sheet for  Patients: BloggerCourse.com  Fact Sheet for Healthcare Providers: SeriousBroker.it  This test is not yet approved or cleared by the [place]  FDA and has been authorized for detection and/or diagnosis of SARS-CoV-2 by FDA under an Emergency Use Authorization (EUA). This EUA will remain in effect (meaning this test can be used) for the duration of the COVID-19 declaration under Section 564(b)(1) of the Act, 21 U.S.C. section 360bbb-3(b)(1), unless the authorization is terminated or revoked.  Performed at [facility], [address] [place], [place] [address]   Urine culture     Status: Abnormal   Collection Time: 05/30/23 12:42 AM   Specimen: Urine, Clean Catch  Result Value Ref Range Status   Specimen Description  Final    URINE, CLEAN CATCH Performed at [facility], [address], [place], [place] [address]    Special Requests   Final    NONE Performed at [facility], [address] [place], [place] [address]    Culture (A)  Final    <10,000 COLONIES/mL INSIGNIFICANT GROWTH Performed at [facility], [address], [place], [place] [address]    Report Status 05/31/2023 FINAL  Final  Blood Culture (routine x 2)     Status: None   Collection Time: 05/30/23  1:22 AM   Specimen: BLOOD RIGHT FOREARM  Result Value Ref Range Status   Specimen Description   Final    BLOOD RIGHT FOREARM Performed at [facility], [address], [place], [place] [address]    Special Requests   Final    BOTTLES DRAWN AEROBIC AND ANAEROBIC Blood Culture adequate volume Performed at [facility], [address] [place], [place] [address]    Culture   Final    NO GROWTH 5 DAYS Performed at [facility], [address], [place], [place] [address]    Report Status 06/04/2023 FINAL  Final  Blood Culture (routine x 2)     Status:  None   Collection Time: 05/30/23  1:32 AM   Specimen: BLOOD RIGHT HAND  Result Value Ref Range Status   Specimen Description   Final    BLOOD RIGHT HAND Performed at [facility], [address], [place], [place] [address]    Special Requests   Final    BOTTLES DRAWN AEROBIC AND ANAEROBIC Blood Culture adequate volume Performed at [facility], [address], [place], [place] [address]    Culture   Final    NO GROWTH 5 DAYS Performed at [facility], [address] [place], [place] [address]    Report Status 06/04/2023 FINAL  Final  Respiratory (~20 pathogens) panel by PCR     Status: Abnormal   Collection Time: 06/04/23  1:50 PM   Specimen: Nasopharyngeal Swab; Respiratory  Result Value Ref Range Status   Adenovirus NOT DETECTED NOT DETECTED Final   Coronavirus 229E NOT DETECTED NOT DETECTED Final    Comment: (NOTE) The Coronavirus on the Respiratory Panel, DOES NOT test for the novel  Coronavirus (2019 nCoV)    Coronavirus HKU1 NOT DETECTED NOT DETECTED Final   Coronavirus NL63 NOT DETECTED NOT DETECTED Final   Coronavirus OC43 NOT DETECTED NOT DETECTED Final   Metapneumovirus NOT DETECTED NOT DETECTED Final   Rhinovirus / Enterovirus NOT DETECTED NOT DETECTED Final   Influenza A NOT DETECTED NOT DETECTED Final   Influenza B NOT DETECTED NOT DETECTED Final   Parainfluenza Virus 1 NOT DETECTED NOT DETECTED Final   Parainfluenza Virus 2 NOT DETECTED NOT DETECTED Final   Parainfluenza Virus 3 DETECTED (A) NOT DETECTED Final   Parainfluenza Virus 4 NOT DETECTED NOT DETECTED Final   Respiratory Syncytial Virus NOT DETECTED NOT DETECTED Final   Bordetella pertussis NOT DETECTED NOT DETECTED Final   Bordetella Parapertussis NOT DETECTED NOT DETECTED Final   Chlamydophila pneumoniae NOT DETECTED NOT DETECTED Final   Mycoplasma pneumoniae NOT DETECTED NOT DETECTED Final    Comment: Performed at [facility], [address] [place],  [place] [address]         Radiology Studies: No results found.       Scheduled Meds:  apixaban   2.5  mg Oral BID   arformoterol   15 mcg Nebulization BID   budesonide  (PULMICORT ) nebulizer solution  0.5 mg Nebulization BID   diltiazem   240 mg Oral Daily   feeding supplement  237 mL Oral BID BM   fluticasone   2 spray Each Nare Daily   guaiFENesin   1,200 mg Oral BID   levothyroxine   75 mcg Oral Q0600   methylPREDNISolone  (SOLU-MEDROL ) injection  80 mg Intravenous Q12H   nystatin   5 mL Oral QID   pantoprazole   40 mg Oral BID   polyethylene glycol  17 g Oral Daily   revefenacin   175 mcg Nebulization Daily   senna-docusate  2 tablet Oral BID   venlafaxine   50 mg Oral BID   Continuous Infusions:          Audria Leather, MD [facility] 06/08/2023, 7:38 AM

## 2023-06-08 NOTE — Progress Notes (Signed)
 Rounding Note    Patient Name: Priscilla Cantrell Date of Encounter: 06/08/2023  [facility] Cardiologist: New  Subjective   PT denies palpitations  No CP    Inpatient Medications    Scheduled Meds:  apixaban   2.5 mg Oral BID   arformoterol   15 mcg Nebulization BID   budesonide  (PULMICORT ) nebulizer solution  0.5 mg Nebulization BID   diltiazem   240 mg Oral Daily   feeding supplement  237 mL Oral BID BM   fluticasone   2 spray Each Nare Daily   guaiFENesin   1,200 mg Oral BID   levothyroxine   75 mcg Oral Q0600   methylPREDNISolone  (SOLU-MEDROL ) injection  80 mg Intravenous Q12H   nystatin   5 mL Oral QID   pantoprazole   40 mg Oral BID   polyethylene glycol  17 g Oral Daily   revefenacin   175 mcg Nebulization Daily   senna-docusate  2 tablet Oral BID   venlafaxine   50 mg Oral BID   Continuous Infusions:   PRN Meds: acetaminophen  **OR** acetaminophen , benzocaine, levalbuterol , ondansetron  **OR** ondansetron  (ZOFRAN ) IV, mouth rinse   Vital Signs    Vitals:   06/08/23 0429 06/08/23 0500 06/08/23 0700 06/08/23 0736  BP: 123/70     Pulse: 67     Resp: (!) 21  20   Temp: 97.7 F (36.5 C)     TempSrc: Oral     SpO2: 94%   95%  Weight:  50.6 kg    Height:        Intake/Output Summary (Last 24 hours) at 06/08/2023 0956 Last data filed at 06/08/2023 0600 Gross per 24 hour  Intake 600 ml  Output 970 ml  Net -370 ml      06/08/2023    5:00 AM 06/07/2023    5:18 AM 06/06/2023    6:30 AM  Last 3 Weights  Weight (lbs) 111 lb 8.8 oz 106 lb 3.2 oz 108 lb 11.2 oz  Weight (kg) 50.6 kg 48.172 kg 49.306 kg      Telemetry    SR, PACs  Bursts of SVT   Rates 160s  Very brief (1 min)   Personally Reviewed  ECG    N/a - Personally Reviewed  Physical Exam   GEN: No acute distress.   Neck: No JVD Cardiac: RRR, no murmurs, Respiratory: Clear to auscultation  Decreased airflow  GI: Soft, nontender,  MS: No LE edema;  Labs    High Sensitivity Troponin:  No  results for input(s): "TROPONINIHS" in the last 720 hours.   Chemistry Recent Labs  Lab 06/05/23 0404 06/06/23 0428 06/07/23 0349  NA 137 137 139  K 3.5 3.3* 3.4*  CL 93* 89* 91*  CO2 32 35* 37*  GLUCOSE 151* 200* 209*  BUN 33* 36* 38*  CREATININE 0.82 0.93 0.76  CALCIUM  8.6* 8.5* 8.5*  MG 2.2 2.4 2.3  PROT 5.8*  --   --   ALBUMIN 2.7*  --   --   AST 22  --   --   ALT 28  --   --   ALKPHOS 64  --   --   BILITOT 1.0  --   --   GFRNONAA >60 60* >60  ANIONGAP 12 13 11     Lipids No results for input(s): "CHOL", "TRIG", "HDL", "LABVLDL", "LDLCALC", "CHOLHDL" in the last 168 hours.  Hematology Recent Labs  Lab 06/05/23 0404 06/06/23 0428 06/07/23 0349  WBC 15.8* 15.4* 17.0*  RBC 4.32 4.53 4.69  HGB  12.6 12.9 13.5  HCT 38.6 41.1 41.5  MCV 89.4 90.7 88.5  MCH 29.2 28.5 28.8  MCHC 32.6 31.4 32.5  RDW 12.8 12.9 13.0  PLT 305 304 300   Thyroid   Recent Labs  Lab 06/01/23 1502  FREET4 2.15*    BNP Recent Labs  Lab 06/03/23 0416  BNP 433.8*    DDimer No results for input(s): "DDIMER" in the last 168 hours.   Radiology    No results found.  Cardiac Studies   Echo 06/02/23   1. Left ventricular ejection fraction, by estimation, is 60 to 65%. The  left ventricle has normal function. The left ventricle has no regional  wall motion abnormalities. Left ventricular diastolic parameters are  consistent with Grade I diastolic  dysfunction (impaired relaxation). Elevated left atrial pressure.   2. Right ventricular systolic function is normal. The right ventricular  size is moderately enlarged. There is severely elevated pulmonary artery  systolic pressure.   3. Right atrial size was moderately dilated.   4. The mitral valve is normal in structure. No evidence of mitral valve  regurgitation.   5. Tricuspid valve regurgitation is moderate to severe.   6. The aortic valve is tricuspid. Aortic valve regurgitation is not  visualized. No aortic stenosis is present.   7.  There is borderline dilatation of the ascending aorta, measuring 38  mm.   8. The inferior vena cava is dilated in size with <50% respiratory  variability, suggesting right atrial pressure of 15 mmHg.   Comparison(s): Prior images reviewed side by side. There is interval  dilation of the right ventricle, worsened tricuspid insufficiency and  higher systolic pulmonary artery pressure.    Patient Profile     Priscilla Cantrell is a 88 y.o. female with a hx of oxygen  dependent COPD with 2LNC, hypothyroidism, HTN, depression, who is being seen 06/01/2023 for the evaluation of CHF at the request of Dr Alfonse Angle.   Assessment & Plan  1. Acute HFpEF - 05/2023 echo: LVEF 60-65%, grade I dd. Mod RV enlargement with normal function, severe pulm HTN PASP 64 mmHg, mod to severe TR, dilated fixed IVC She has diuresed with IV lasix   Wt down  Neg 5.2 L I would recomm PO 60 mg lasix  with 20 KCL   Follow renal function I would not recomm SGLT 2 inhibitor given age, frailty and lmited mobility  Avoid UTI  2  Pulmonary HTN - - 2023 echo PASP 40 mmHg.  - PASP 64 by echo - - likely group II and III pulmonary HTN, echo was in setting of volume overload as well with elevated left sided pressures and pneumonia/COPD with significant hypoxia Follow for now     3 . New onset afib - new diagnosis this admission, afib with RVR in setting of pneumonia and COPD exacerbation, also hyperthyroid TSH 0.313 - on dilt 240, eliquis  for stroke prevention   4  SVT  Pt with short bursts of SVT   Last about 1 min  or less   Follow  ON diltiazem  Would readd b blocker   Toprol  XL 25    5  .COPD exacerbation/Pneumonia - per primary team    For questions or updates, please contact [facility] Please consult [email or web address] for contact info under        Signed, Ola Berger, MD  06/08/2023, 9:56 AM

## 2023-06-08 NOTE — Progress Notes (Signed)
 Daily Progress Note   Patient Name: Priscilla Cantrell       Date: 06/08/2023 DOB: [birth year]  Age: 88 y.o. MRN#: [id number] Attending Physician: Audria Leather, MD Primary Care Physician: Shelvia Dick, MD Admit Date: 05/30/2023  Reason for Consultation/Follow-up: Establishing goals of care  Subjective:  Attempts being made to wean down her O2.cardiology following.   Length of Stay: 9  Current Medications: Scheduled Meds:   apixaban   2.5 mg Oral BID   arformoterol   15 mcg Nebulization BID   budesonide  (PULMICORT ) nebulizer solution  0.5 mg Nebulization BID   diltiazem   240 mg Oral Daily   feeding supplement  237 mL Oral BID BM   fluticasone   2 spray Each Nare Daily   guaiFENesin   1,200 mg Oral BID   levothyroxine   75 mcg Oral Q0600   methylPREDNISolone  (SOLU-MEDROL ) injection  80 mg Intravenous Q12H   metoprolol  succinate  25 mg Oral Daily   nystatin   5 mL Oral QID   pantoprazole   40 mg Oral BID   polyethylene glycol  17 g Oral Daily   revefenacin   175 mcg Nebulization Daily   senna-docusate  2 tablet Oral BID   venlafaxine   50 mg Oral BID    Continuous Infusions:   PRN Meds: acetaminophen  **OR** acetaminophen , benzocaine, levalbuterol , ondansetron  **OR** ondansetron  (ZOFRAN ) IV, mouth rinse  Physical Exam         Awake alert On HHFNC No distress Regular work of breathing  Vital Signs: BP 121/64 (BP Location: Left Arm)   Pulse 80   Temp 98.6 F (37 C) (Oral)   Resp 20   Ht 5\' 2"  (1.575 m)   Wt 50.6 kg   SpO2 99%   BMI 20.40 kg/m  SpO2: SpO2: 99 % O2 Device: O2 Device: Nasal Cannula O2 Flow Rate: O2 Flow Rate (L/min): 45 L/min  Intake/output summary:  Intake/Output Summary (Last 24 hours) at 06/08/2023 1525 Last data filed at 06/08/2023 0600 Gross per 24 hour   Intake 600 ml  Output 970 ml  Net -370 ml   LBM: Last BM Date : 06/07/23 Baseline Weight: Weight: 55.3 kg Most recent weight: Weight: 50.6 kg       Palliative Assessment/Data:      Patient Active Problem List   Diagnosis Date Noted   Acute on chronic  respiratory failure with hypoxia (HCC) 06/06/2023   Pressure injury of skin 06/06/2023   Infection due to parainfluenza virus 3 06/05/2023   Viral pneumonia 06/05/2023   Multifocal pneumonia 06/04/2023   Acute diastolic CHF (congestive heart failure) (HCC) 06/02/2023   Atrial fibrillation with RVR (HCC) 06/02/2023   SVT (supraventricular tachycardia) (HCC) 06/02/2023   COPD exacerbation (HCC) 06/02/2023   Sepsis due to pneumonia (HCC) 05/30/2023   Hip dislocation, right, initial encounter (HCC) 02/10/2023   Sepsis (HCC) 02/22/2022   Acute respiratory failure with hypoxia (HCC) 02/21/2022   Influenza A 02/21/2022   Shortness of breath 12/08/2019   Swelling of lower extremity 12/08/2019   Stress due to family tension 12/08/2019   Chronic cystitis with hematuria 08/15/2019   Urinary urgency 08/15/2019   Multiple closed fractures of ribs of right side 10/05/2018   Healthcare maintenance 10/05/2018   Medication management 07/29/2018   Overweight (BMI 25.0-29.9) 12/09/2017   S/P left KA 12/08/2017   S/P left THA, AA 12/08/2017   Postherpetic neuralgia 03/07/2015   Constipation 02/28/2015   Renal mass 02/28/2015   Acute pyelonephritis 02/20/2015   Oral thrush 02/08/2015   Chronic hypoxemic respiratory failure (HCC) 02/02/2015   SOB (shortness of breath)    Essential hypertension 01/24/2015   UTI (urinary tract infection) 01/09/2015   Pain and swelling of right lower leg 01/09/2015   H/O total hip arthroplasty 11/01/2014   Spinal stenosis, lumbar region, with neurogenic claudication 01/04/2014   Osteoarthritis, hip, bilateral 07/27/2013   Sciatica associated with disorder of lumbosacral spine 04/19/2013   Atrophic  vaginitis 01/12/2012   Periodic limb movement disorder 11/24/2011   Hypoxia 05/22/2011   Depression with anxiety 04/15/2011   Chronic venous insufficiency 07/29/2010   Hypothyroidism due to Hashimoto's thyroiditis 01/07/2010   SPINAL STENOSIS 01/07/2010   Allergic rhinitis 08/09/2007   COPD GOLD III B 08/09/2007    Palliative Care Assessment & Plan   Patient Profile:    Assessment:  Acute on chronic hypoxic respiratory failure due to parainfleunza pneumonia; cannot rule out superimposed bacterial pneumonia as well Bibasilar pneumonia/community-acquired with worsening infiltrates Acute HFpEF  Pulmonary HTN New onset A fib.   Recommendations/Plan:  Wants to continue current mode of care, full code, full scope, not opposed to SNF rehab with palliative on discharge.    Code Status:    Code Status Orders  (From admission, onward)           Start     Ordered   05/30/23 0904  Full code  Continuous       Question:  By:  Answer:  Consent: discussion documented in EHR   05/30/23 0904           Code Status History     Date Active Date Inactive Code Status Order ID Comments User Context   02/10/2023 0842 02/17/2023 1820 Full Code [id number]  Heddy Liverpool, NP Inpatient   02/21/2022 1915 02/24/2022 1531 Full Code [id number]  Kenny Peals, MD ED   12/08/2017 1546 12/10/2017 1731 Full Code [id number]  Sheilah Denver Inpatient   02/20/2015 2156 02/22/2015 2019 Full Code [id number]  Ephriam Hashimoto, MD Inpatient   01/24/2015 1506 01/27/2015 1549 Full Code [id number]  Otilia Bloch, MD ED   11/01/2014 1145 11/04/2014 1739 Full Code [id number]  Hazle Lites, MD Inpatient   08/26/2013 1354 08/27/2013 0335 Full Code [id number]  Shogry, Marcus Sewer, MD HOV      Advance Directive Documentation    Flowsheet Row Most Recent  Value  Type of Advance Directive Living will  Pre-existing out of facility DNR order (yellow form or pink MOST form) --  "MOST" Form in Place? --        Prognosis:  Unable to determine  Discharge Planning: Skilled Nursing Facility for rehab with Palliative care service follow-up  Care plan was discussed with IDT  Thank you for allowing the Palliative Medicine Team to assist in the care of this patient. low MDM.      Greater than 50%  of this time was spent counseling and coordinating care related to the above assessment and plan.  Lujean Sake, MD  Please contact Palliative Medicine Team phone at [phone] for questions and concerns.

## 2023-06-08 NOTE — Progress Notes (Signed)
 PT Cancellation Note  Patient Details Name: Priscilla Cantrell MRN: [id number] DOB: 10-Nov-1935   Cancelled Treatment:     Attempted x 2, pt had just transferred to bedside chair and fatigued, returned in pm, pt still eating. Will plan to progress with PT tomorrow per POC.   Diona Franklin 06/08/2023, 4:50 PM

## 2023-06-08 NOTE — Plan of Care (Signed)

## 2023-06-09 ENCOUNTER — Other Ambulatory Visit: Payer: Self-pay | Admitting: Physician Assistant

## 2023-06-09 DIAGNOSIS — J9622 Acute and chronic respiratory failure with hypercapnia: Secondary | ICD-10-CM | POA: Diagnosis not present

## 2023-06-09 DIAGNOSIS — J449 Chronic obstructive pulmonary disease, unspecified: Secondary | ICD-10-CM

## 2023-06-09 DIAGNOSIS — J9621 Acute and chronic respiratory failure with hypoxia: Secondary | ICD-10-CM | POA: Diagnosis not present

## 2023-06-09 DIAGNOSIS — J1289 Other viral pneumonia: Secondary | ICD-10-CM | POA: Diagnosis not present

## 2023-06-09 DIAGNOSIS — I5032 Chronic diastolic (congestive) heart failure: Secondary | ICD-10-CM

## 2023-06-09 DIAGNOSIS — I5031 Acute diastolic (congestive) heart failure: Secondary | ICD-10-CM

## 2023-06-09 DIAGNOSIS — B348 Other viral infections of unspecified site: Secondary | ICD-10-CM | POA: Diagnosis not present

## 2023-06-09 DIAGNOSIS — Z515 Encounter for palliative care: Secondary | ICD-10-CM

## 2023-06-09 DIAGNOSIS — J189 Pneumonia, unspecified organism: Secondary | ICD-10-CM | POA: Diagnosis not present

## 2023-06-09 DIAGNOSIS — J441 Chronic obstructive pulmonary disease with (acute) exacerbation: Secondary | ICD-10-CM | POA: Diagnosis not present

## 2023-06-09 DIAGNOSIS — Z7189 Other specified counseling: Secondary | ICD-10-CM

## 2023-06-09 DIAGNOSIS — A419 Sepsis, unspecified organism: Secondary | ICD-10-CM | POA: Diagnosis not present

## 2023-06-09 LAB — CBC WITH DIFFERENTIAL/PLATELET
Abs Immature Granulocytes: 0.47 10*3/uL — ABNORMAL HIGH (ref 0.00–0.07)
Basophils Absolute: 0.1 10*3/uL (ref 0.0–0.1)
Basophils Relative: 0 %
Eosinophils Absolute: 0 10*3/uL (ref 0.0–0.5)
Eosinophils Relative: 0 %
HCT: 44.6 % (ref 36.0–46.0)
Hemoglobin: 14.2 g/dL (ref 12.0–15.0)
Immature Granulocytes: 2 %
Lymphocytes Relative: 2 %
Lymphs Abs: 0.6 10*3/uL — ABNORMAL LOW (ref 0.7–4.0)
MCH: 28.6 pg (ref 26.0–34.0)
MCHC: 31.8 g/dL (ref 30.0–36.0)
MCV: 89.7 fL (ref 80.0–100.0)
Monocytes Absolute: 0.6 10*3/uL (ref 0.1–1.0)
Monocytes Relative: 2 %
Neutro Abs: 27.9 10*3/uL — ABNORMAL HIGH (ref 1.7–7.7)
Neutrophils Relative %: 94 %
Platelets: 279 10*3/uL (ref 150–400)
RBC: 4.97 MIL/uL (ref 3.87–5.11)
RDW: 13.2 % (ref 11.5–15.5)
WBC: 29.7 10*3/uL — ABNORMAL HIGH (ref 4.0–10.5)
nRBC: 0 % (ref 0.0–0.2)

## 2023-06-09 LAB — BASIC METABOLIC PANEL WITH GFR
Anion gap: 12 (ref 5–15)
BUN: 51 mg/dL — ABNORMAL HIGH (ref 8–23)
CO2: 33 mmol/L — ABNORMAL HIGH (ref 22–32)
Calcium: 8.7 mg/dL — ABNORMAL LOW (ref 8.9–10.3)
Chloride: 88 mmol/L — ABNORMAL LOW (ref 98–111)
Creatinine, Ser: 0.87 mg/dL (ref 0.44–1.00)
GFR, Estimated: >60 mL/min (ref >60)
Glucose, Bld: 203 mg/dL — ABNORMAL HIGH (ref 70–99)
Potassium: 3.4 mmol/L — ABNORMAL LOW (ref 3.5–5.1)
Sodium: 133 mmol/L — ABNORMAL LOW (ref 135–145)

## 2023-06-09 LAB — MAGNESIUM: Magnesium: 2.6 mg/dL — ABNORMAL HIGH (ref 1.7–2.4)

## 2023-06-09 MED ORDER — LIDOCAINE VISCOUS HCL 2 % MT SOLN
15.0000 mL | Freq: Four times a day (QID) | OROMUCOSAL | Status: AC | PRN
  Administered 2023-06-09 – 2023-06-10 (×2): 15 mL via OROMUCOSAL
  Filled 2023-06-09 (×4): qty 15

## 2023-06-09 MED ORDER — POTASSIUM CHLORIDE CRYS ER 20 MEQ PO TBCR
20.0000 meq | EXTENDED_RELEASE_TABLET | Freq: Every day | ORAL | Status: DC
  Administered 2023-06-10 – 2023-06-17 (×8): 20 meq via ORAL
  Filled 2023-06-09 (×8): qty 1

## 2023-06-09 MED ORDER — METHYLPREDNISOLONE SODIUM SUCC 40 MG IJ SOLR
40.0000 mg | Freq: Two times a day (BID) | INTRAMUSCULAR | Status: DC
  Administered 2023-06-09 – 2023-06-11 (×6): 40 mg via INTRAVENOUS
  Filled 2023-06-09 (×6): qty 1

## 2023-06-09 MED ORDER — FUROSEMIDE 40 MG PO TABS
60.0000 mg | ORAL_TABLET | Freq: Every day | ORAL | Status: DC
  Administered 2023-06-09 – 2023-06-17 (×9): 60 mg via ORAL
  Filled 2023-06-09 (×9): qty 1

## 2023-06-09 MED ORDER — POTASSIUM CHLORIDE CRYS ER 20 MEQ PO TBCR
40.0000 meq | EXTENDED_RELEASE_TABLET | Freq: Once | ORAL | Status: AC
  Administered 2023-06-09: 40 meq via ORAL
  Filled 2023-06-09: qty 2

## 2023-06-09 NOTE — NC FL2 (Signed)
 [place]  MEDICAID FL2 LEVEL OF CARE FORM     IDENTIFICATION  Patient Name: Priscilla Cantrell Birthdate: September 23, 1935 Sex: female Admission Date (Current Location): 05/30/2023  [facility] and [place] Number:  Producer, television/film/video and Address:  [facility],  [address], [place] [address]      Provider Number: [id number]  Attending Physician Name and Address:  Audria Leather, MD  Relative Name and Phone Number:  [name] Belvia Boyer)  [phone] [facility])    Current Level of Care: Hospital Recommended Level of Care: Skilled Nursing Facility Prior Approval Number:    Date Approved/Denied:   PASRR Number: [id number]  Discharge Plan: SNF    Current Diagnoses: Patient Active Problem List   Diagnosis Date Noted   Acute on chronic respiratory failure with hypoxia (HCC) 06/06/2023   Pressure injury of skin 06/06/2023   Infection due to parainfluenza virus 3 06/05/2023   Viral pneumonia 06/05/2023   Multifocal pneumonia 06/04/2023   Acute diastolic CHF (congestive heart failure) (HCC) 06/02/2023   Atrial fibrillation with RVR (HCC) 06/02/2023   SVT (supraventricular tachycardia) (HCC) 06/02/2023   COPD exacerbation (HCC) 06/02/2023   Sepsis due to pneumonia (HCC) 05/30/2023   Hip dislocation, right, initial encounter (HCC) 02/10/2023   Sepsis (HCC) 02/22/2022   Acute respiratory failure with hypoxia (HCC) 02/21/2022   Influenza A 02/21/2022   Shortness of breath 12/08/2019   Swelling of lower extremity 12/08/2019   Stress due to family tension 12/08/2019   Chronic cystitis with hematuria 08/15/2019   Urinary urgency 08/15/2019   Multiple closed fractures of ribs of right side 10/05/2018   Healthcare maintenance 10/05/2018   Medication management 07/29/2018   Overweight (BMI 25.0-29.9) 12/09/2017   S/P left KA 12/08/2017   S/P left THA, AA 12/08/2017   Postherpetic neuralgia 03/07/2015   Constipation 02/28/2015   Renal mass 02/28/2015   Acute  pyelonephritis 02/20/2015   Oral thrush 02/08/2015   Chronic hypoxemic respiratory failure (HCC) 02/02/2015   SOB (shortness of breath)    Essential hypertension 01/24/2015   UTI (urinary tract infection) 01/09/2015   Pain and swelling of right lower leg 01/09/2015   H/O total hip arthroplasty 11/01/2014   Spinal stenosis, lumbar region, with neurogenic claudication 01/04/2014   Osteoarthritis, hip, bilateral 07/27/2013   Sciatica associated with disorder of lumbosacral spine 04/19/2013   Atrophic vaginitis 01/12/2012   Periodic limb movement disorder 11/24/2011   Hypoxia 05/22/2011   Depression with anxiety 04/15/2011   Chronic venous insufficiency 07/29/2010   Hypothyroidism due to Hashimoto's thyroiditis 01/07/2010   SPINAL STENOSIS 01/07/2010   Allergic rhinitis 08/09/2007   COPD GOLD III B 08/09/2007    Orientation RESPIRATION BLADDER Height & Weight     Self, Time, Situation, Place  O2 (HFNC 5L) Continent Weight: 106 lb 3.2 oz (48.2 kg) Height:  5\' 2"  (157.5 cm)  BEHAVIORAL SYMPTOMS/MOOD NEUROLOGICAL BOWEL NUTRITION STATUS      Continent Diet (regular)  AMBULATORY STATUS COMMUNICATION OF NEEDS Skin   Limited Assist Verbally PU Stage and Appropriate Care (L nare)                       Personal Care Assistance Level of Assistance  Bathing, Feeding, Dressing Bathing Assistance: Limited assistance Feeding assistance: Independent Dressing Assistance: Limited assistance     Functional Limitations Info  Sight, Hearing, Speech Sight Info: Adequate Hearing Info: Adequate Speech Info: Adequate    SPECIAL CARE FACTORS FREQUENCY  PT (By licensed PT), OT (By licensed OT)  PT Frequency: 5 x a week OT Frequency: 5 x a week            Contractures Contractures Info: Not present    Additional Factors Info  Code Status, Allergies Code Status Info: full Allergies Info: Losartan   Penicillins  Cefixime (Kdc:cefixime)  Gabapentin   Indocin (Indomethacin)   Singulair  (Montelukast  Sodium)  Sulfa Antibiotics  Ace Inhibitors  Statins           Current Medications (06/09/2023):  This is the current hospital active medication list Current Facility-Administered Medications  Medication Dose Route Frequency Provider Last Rate Last Admin   acetaminophen  (TYLENOL ) tablet 650 mg  650 mg Oral Q6H PRN Jannette Mend, Mir M, MD   650 mg at 06/04/23 0109   Or   acetaminophen  (TYLENOL ) suppository 650 mg  650 mg Rectal Q6H PRN Gaylin Ke, MD       apixaban  (ELIQUIS ) tablet 2.5 mg  2.5 mg Oral BID Delpha Fickle, RPH   2.5 mg at 06/09/23 1009   arformoterol  (BROVANA ) nebulizer solution 15 mcg  15 mcg Nebulization BID Alva, Rakesh V, MD   15 mcg at 06/09/23 0838   benzocaine (ORAJEL) 10 % mucosal gel   Mouth/Throat QID PRN Audria Leather, MD   Given at 06/07/23 1722   budesonide  (PULMICORT ) nebulizer solution 0.5 mg  0.5 mg Nebulization BID Audria Leather, MD   0.5 mg at 06/09/23 3235   diltiazem  (CARDIZEM  CD) 24 hr capsule 240 mg  240 mg Oral Daily Branch, Jonathan F, MD   240 mg at 06/09/23 1009   feeding supplement (ENSURE ENLIVE / ENSURE PLUS) liquid 237 mL  237 mL Oral BID BM Alekh, Kshitiz, MD   237 mL at 06/09/23 1013   fluticasone  (FLONASE ) 50 MCG/ACT nasal spray 2 spray  2 spray Each Nare Daily Jannette Mend, Mir M, MD   2 spray at 06/09/23 1008   furosemide  (LASIX ) tablet 60 mg  60 mg Oral Daily Meng, Hao, PA   60 mg at 06/09/23 1009   guaiFENesin  (MUCINEX ) 12 hr tablet 1,200 mg  1,200 mg Oral BID Lama, Gagan S, MD   1,200 mg at 06/09/23 1009   levalbuterol  (XOPENEX ) nebulizer solution 0.63 mg  0.63 mg Nebulization Q6H PRN Lind Repine, MD       levothyroxine  (SYNTHROID ) tablet 75 mcg  75 mcg Oral Q0600 Ozell Blunt, MD   75 mcg at 06/09/23 5732   methylPREDNISolone  sodium succinate (SOLU-MEDROL ) 40 mg/mL injection 40 mg  40 mg Intravenous Q12H Alekh, Kshitiz, MD   40 mg at 06/09/23 1015   metoprolol  succinate (TOPROL -XL) 24 hr tablet 25 mg  25 mg Oral  Daily Ross, Paula V, MD   25 mg at 06/09/23 1008   nystatin  (MYCOSTATIN ) 100000 UNIT/ML suspension 500,000 Units  5 mL Oral QID Audria Leather, MD   500,000 Units at 06/09/23 1009   ondansetron  (ZOFRAN ) tablet 4 mg  4 mg Oral Q6H PRN Jannette Mend, Mir M, MD       Or   ondansetron  (ZOFRAN ) injection 4 mg  4 mg Intravenous Q6H PRN Jannette Mend, Mir M, MD       Oral care mouth rinse  15 mL Mouth Rinse PRN Audria Leather, MD       pantoprazole  (PROTONIX ) EC tablet 40 mg  40 mg Oral BID Ikramullah, Mir M, MD   40 mg at 06/09/23 1009   polyethylene glycol (MIRALAX  / GLYCOLAX ) packet 17 g  17 g Oral Daily Kyere, Belinda K, NP  17 g at 06/06/23 0827   [START ON 06/10/2023] potassium chloride  SA (KLOR-CON  M) CR tablet 20 mEq  20 mEq Oral Daily Meng, Hao, [place]       revefenacin  (YUPELRI ) nebulizer solution 175 mcg  175 mcg Nebulization Daily Alva, Rakesh V, MD   175 mcg at 06/09/23 0838   senna-docusate (Senokot-S) tablet 2 tablet  2 tablet Oral BID Kyere, Belinda K, NP   2 tablet at 06/08/23 4098   venlafaxine  (EFFEXOR ) tablet 50 mg  50 mg Oral BID Gaylin Ke, MD   50 mg at 06/09/23 1009     Discharge Medications: Please see discharge summary for a list of discharge medications.  Relevant Imaging Results:  Relevant Lab Results:   Additional Information SSN :[id number]  Arta Lark Jerianne Anselmo, LCSW

## 2023-06-09 NOTE — Progress Notes (Addendum)
 Rounding Note    Patient Name: Priscilla Cantrell Date of Encounter: 06/09/2023  [facility] Cardiologist: None   Subjective   Denies any CP, breathing is stable.   Inpatient Medications    Scheduled Meds:  apixaban   2.5 mg Oral BID   arformoterol   15 mcg Nebulization BID   budesonide  (PULMICORT ) nebulizer solution  0.5 mg Nebulization BID   diltiazem   240 mg Oral Daily   feeding supplement  237 mL Oral BID BM   fluticasone   2 spray Each Nare Daily   guaiFENesin   1,200 mg Oral BID   levothyroxine   75 mcg Oral Q0600   methylPREDNISolone  (SOLU-MEDROL ) injection  40 mg Intravenous Q12H   metoprolol  succinate  25 mg Oral Daily   nystatin   5 mL Oral QID   pantoprazole   40 mg Oral BID   polyethylene glycol  17 g Oral Daily   potassium chloride   40 mEq Oral Once   revefenacin   175 mcg Nebulization Daily   senna-docusate  2 tablet Oral BID   venlafaxine   50 mg Oral BID   Continuous Infusions:  PRN Meds: acetaminophen  **OR** acetaminophen , benzocaine, levalbuterol , ondansetron  **OR** ondansetron  (ZOFRAN ) IV, mouth rinse   Vital Signs    Vitals:   06/09/23 0600 06/09/23 0700 06/09/23 0805 06/09/23 0839  BP:      Pulse:      Resp:      Temp:      TempSrc:      SpO2: 92% 98% 93% 95%  Weight:      Height:        Intake/Output Summary (Last 24 hours) at 06/09/2023 0927 Last data filed at 06/09/2023 1610 Gross per 24 hour  Intake 717 ml  Output 600 ml  Net 117 ml      06/09/2023    5:00 AM 06/08/2023    5:00 AM 06/07/2023    5:18 AM  Last 3 Weights  Weight (lbs) 106 lb 3.2 oz 111 lb 8.8 oz 106 lb 3.2 oz  Weight (kg) 48.172 kg 50.6 kg 48.172 kg      Telemetry    NSR with single burst of either SVT vs 2:1 atrial flutter - Personally Reviewed  ECG     Afib with RVR. RBBB 05/31/2023 - Personally Reviewed  Physical Exam   GEN: No acute distress.   Neck: No JVD Cardiac: RRR, no murmurs, rubs, or gallops.  Respiratory: Clear to auscultation  bilaterally. GI: Soft, nontender, non-distended  MS: No edema; No deformity. Neuro:  Nonfocal  Psych: Normal affect   Labs    High Sensitivity Troponin:  No results for input(s): "TROPONINIHS" in the last 720 hours.   Chemistry Recent Labs  Lab 06/05/23 0404 06/06/23 0428 06/07/23 0349 06/09/23 0640  NA 137 137 139 133*  K 3.5 3.3* 3.4* 3.4*  CL 93* 89* 91* 88*  CO2 32 35* 37* 33*  GLUCOSE 151* 200* 209* 203*  BUN 33* 36* 38* 51*  CREATININE 0.82 0.93 0.76 0.87  CALCIUM  8.6* 8.5* 8.5* 8.7*  MG 2.2 2.4 2.3 2.6*  PROT 5.8*  --   --   --   ALBUMIN 2.7*  --   --   --   AST 22  --   --   --   ALT 28  --   --   --   ALKPHOS 64  --   --   --   BILITOT 1.0  --   --   --  GFRNONAA >60 60* >60 >60  ANIONGAP 12 13 11 12     Lipids No results for input(s): "CHOL", "TRIG", "HDL", "LABVLDL", "LDLCALC", "CHOLHDL" in the last 168 hours.  Hematology Recent Labs  Lab 06/06/23 0428 06/07/23 0349 06/09/23 0640  WBC 15.4* 17.0* 29.7*  RBC 4.53 4.69 4.97  HGB 12.9 13.5 14.2  HCT 41.1 41.5 44.6  MCV 90.7 88.5 89.7  MCH 28.5 28.8 28.6  MCHC 31.4 32.5 31.8  RDW 12.9 13.0 13.2  PLT 304 300 279   Thyroid  No results for input(s): "TSH", "FREET4" in the last 168 hours.  BNP Recent Labs  Lab 06/03/23 0416  BNP 433.8*    DDimer No results for input(s): "DDIMER" in the last 168 hours.   Radiology    No results found.  Cardiac Studies   Echo 06/02/2023  1. Left ventricular ejection fraction, by estimation, is 60 to 65%. The  left ventricle has normal function. The left ventricle has no regional  wall motion abnormalities. Left ventricular diastolic parameters are  consistent with Grade I diastolic  dysfunction (impaired relaxation). Elevated left atrial pressure.   2. Right ventricular systolic function is normal. The right ventricular  size is moderately enlarged. There is severely elevated pulmonary artery  systolic pressure.   3. Right atrial size was moderately dilated.    4. The mitral valve is normal in structure. No evidence of mitral valve  regurgitation.   5. Tricuspid valve regurgitation is moderate to severe.   6. The aortic valve is tricuspid. Aortic valve regurgitation is not  visualized. No aortic stenosis is present.   7. There is borderline dilatation of the ascending aorta, measuring 38  mm.   8. The inferior vena cava is dilated in size with <50% respiratory  variability, suggesting right atrial pressure of 15 mmHg.   Comparison(s): Prior images reviewed side by side. There is interval  dilation of the right ventricle, worsened tricuspid insufficiency and  higher systolic pulmonary artery pressure.   Patient Profile     88 y.o. female with PMH of O2 dependent COPD with 2L [place], hypothyroidism, HTN, and depression who presented with progressive SOB, admitted on 4/26. Treated for COPD exacerbation and PNA given leukocytosis and fever, cardiology consulted due to concern for CHF and new afib.   Assessment & Plan    Acute on chronic hypoxemic respiratory failure  - felt to be due to combination of COPD/CAP and acute diastolic CHF   COPD exacerbation/CAP: per primary team  Acute diastolic CHF  -  Echo 05/2023 EF 60-65%, grade 1 DD, moderate RV enlargement with normal function, severe pulm HTN PASP 64 mmHg, moderate to severe TR, dilated fixed IVC  - started on PO lasix  and potassium supplement. Patient is euvolemic on exam. Need BMET in 1-2 weeks to make sure she is able to tolerate diuretic.   New onset of atrial fibrillation with RVR  - CHA2DS2-VASc score is 7 (hypertension, CHF, hypertension, CAD, age> 75, female)    - free T4 high  - started on eliquis  2.5mg  BID and rate control therapy (metop and dilt). Self converted to NSR  - burst of either 2:1 atrial flutter or SVT last night, currently in NSR  5. Hyperthyroidism  Synthroid  dose decreased  this admit       For questions or updates, please contact  [facility] Please  consult [email or web address] for contact info under        Signed, Ervin Heath, PA  06/09/2023, 9:27 AM  PT seen / examned   I agree with findings as noted above by Rosetta Cons  PT is comfortable in chair eating lunch  ON 4 L O2 Lungs   Moving air  Cardiac exam   RRR  No S3  NO murmurs  Abd is benign Ext   Without edema  HFpEF    Volume status overall good   On oral lasix  and K   Agree with albs in a couple weeks  Rhythm   Pt with PAF and SVT    Currently in SR     ON Eliquis      Thyroid  dysfunction may be contributing    Follow over time   WIll sign off   We will make sure pt has follow up for labs and follow up in clinic   Please call with questions   Ola Berger MD

## 2023-06-09 NOTE — Progress Notes (Signed)
   06/09/23 0001  BiPAP/CPAP/SIPAP  BiPAP/CPAP/SIPAP Pt Type Adult  BiPAP/CPAP/SIPAP SERVO  Mask Type Full face mask  Dentures removed? Yes - Placed in denture cup  Mask Size Small  Set Rate 15 breaths/min  Respiratory Rate 30 breaths/min  IPAP 14 cmH20  EPAP 6 cmH2O  Pressure Support 8 cmH20  PEEP 6 cmH20  FiO2 (%) 40 %  Minute Ventilation 12.9  Leak 53  Peak Inspiratory Pressure (PIP) 15  Tidal Volume (Vt) 684  Patient Home Machine No  Patient Home Mask No  Patient Home Tubing No  Auto Titrate No  Press High Alarm 30 cmH2O  Press Low Alarm 5 cmH2O  Nasal massage performed No (comment)  Device Plugged into RED Power Outlet Yes

## 2023-06-09 NOTE — Progress Notes (Signed)
 Physical Therapy Treatment Patient Details Name: Priscilla Cantrell MRN: [id number] DOB: [birth year] Today's Date: 06/09/2023   History of Present Illness Patient is a 88 year old female who presented on 4/26 with SOB. Patient was admitted with sepsis, a fib with RVR, acute on chronic hypoxic respiratory failure, COPD exacerbation, acute on chronic diastolic CHF and CAP. Pt requiring HHFNC. 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    PT Comments  Pt received up in chair on 5L O2, SpO2 93% at rest. Pt declined gait training, stating she ambulated to elevators with nursing earlier this am. PT session consisted of B LE strengthening exercises, sit<>stand x5 with several prolonged rest breaks due to SpO2 desating 84% upon exertion. Pt able to recover with PLB technique after 1-2 minutes. Reviewed R Post. Hip Precautions with good recall, educated pt on reducing falls at home and energy conservation techniques. Will continue to progress acutely per POC.    If plan is discharge home, recommend the following: A little help with walking and/or transfers;A little help with bathing/dressing/bathroom;Assistance with cooking/housework;Assist for transportation;Help with stairs or ramp for entrance   Can travel by private vehicle     Yes  Equipment Recommendations  None recommended by PT    Recommendations for Other Services       Precautions / Restrictions Precautions Precautions: Fall;Posterior Hip Precaution Booklet Issued: No Recall of Precautions/Restrictions: Impaired Precaution/Restrictions Comments: Pt able to recall 3/3 Post. Hip Precautions Restrictions Weight Bearing Restrictions Per Provider Order: No Other Position/Activity Restrictions: Right Posterior hip Revision after dislocation on 02/10/23     Mobility  Bed Mobility               General bed mobility comments:  N/T in chair pre/post session    Transfers Overall transfer level: Needs assistance Equipment used: Rolling walker (2 wheels) Transfers: Sit to/from Stand Sit to Stand: Contact guard assist, From elevated surface           General transfer comment: Able to stand on first attempt from raised seat    Ambulation/Gait               General Gait Details: Pt too fatigued after ambulating with nursing in hallway   Stairs             Wheelchair Mobility     Tilt Bed    Modified Rankin (Stroke Patients Only)       Balance Overall balance assessment: Needs assistance Sitting-balance support: No upper extremity supported Sitting balance-Leahy Scale: Good     Standing balance support: Bilateral upper extremity supported, During functional activity, Reliant on assistive device for balance Standing balance-Leahy Scale: Fair Standing balance comment: S/CGA for gait with RW per nursing                            Communication Communication Communication: No apparent difficulties  Cognition Arousal: Alert Behavior During Therapy: WFL for tasks assessed/performed   PT - Cognitive impairments: No apparent impairments                       PT - Cognition Comments: pt stated, "I have dementia." Following commands: Intact      Cueing Cueing Techniques: Verbal cues  Exercises General Exercises - Lower Extremity Ankle Circles/Pumps: AROM, Both, 20 reps, Seated Long Arc Quad: AROM, Both, 20 reps, Seated Hip  Flexion/Marching: AROM, Left, 10 reps, Seated Other Exercises Other Exercises: Pt educated on PLB and relaxation technique with good understanding and teach back.    General Comments General comments (skin integrity, edema, etc.): Pt educated on role of PT, benefits of STR, and remaining functional goals      Pertinent Vitals/Pain Pain Assessment Pain Assessment: Faces Faces Pain Scale: Hurts a little bit Pain Location: R hip  occassionaly Pain Descriptors / Indicators: Aching, Discomfort Pain Intervention(s): Limited activity within patient's tolerance    Home Living                          Prior Function            PT Goals (current goals can now be found in the care plan section) Acute Rehab PT Goals Patient Stated Goal: likes to walk Progress towards PT goals: Progressing toward goals    Frequency    Min 3X/week      PT Plan      Co-evaluation              AM-PAC PT "6 Clicks" Mobility   Outcome Measure  Help needed turning from your back to your side while in a flat bed without using bedrails?: A Little Help needed moving from lying on your back to sitting on the side of a flat bed without using bedrails?: A Little Help needed moving to and from a bed to a chair (including a wheelchair)?: A Little Help needed standing up from a chair using your arms (e.g., wheelchair or bedside chair)?: A Little Help needed to walk in hospital room?: A Little Help needed climbing 3-5 steps with a railing? : A Lot 6 Click Score: 17    End of Session Equipment Utilized During Treatment: Oxygen  (5L O2 [place]) Activity Tolerance: Patient tolerated treatment well Patient left: in chair;with call bell/phone within reach Nurse Communication: Mobility status;Other (comment) (c/o thrush) PT Visit Diagnosis: Difficulty in walking, not elsewhere classified (R26.2);History of falling (Z91.81)     Time: 4098-1191 PT Time Calculation (min) (ACUTE ONLY): 33 min  Charges:    $Therapeutic Exercise: 8-22 mins $Therapeutic Activity: 8-22 mins PT General Charges $$ ACUTE PT VISIT: 1 Visit                    Melvyn Stagers, PTA  Diona Franklin 06/09/2023, 12:23 PM

## 2023-06-09 NOTE — Progress Notes (Signed)
 NAME:  Priscilla Cantrell, MRN:  [id number], DOB:  March 13, 1935, LOS: 10 ADMISSION DATE:  05/30/2023, CONSULTATION DATE:  06/09/2023 PCP McGowen, Priscilla Aly, MD [facility] [address] Priscilla Cantrell [phone]   REFERRING MD:  Priscilla Cantrell , CHIEF COMPLAINT: Respiratory distress  BRIEF  88 year old woman with severe COPD and chronic respiratory failure with hypoxia.  At baseline she uses 2 L at rest and 4 L with exertion.  She was admitted with 2 weeks of shortness of breath and cough with green sputum production and hypoxia to 70% on her baseline 2 L oxygen .  Chest x-ray showed bibasilar infiltrates, urine strep antigen was negative.  She was febrile tachycardic with leukocytosis. She was started on Levaquin  treated with BiPAP Course was complicated by new onset atrial fibrillation with RVR and elevated BNP requiring diuretics.  Cardiology was consulted, echo showed normal LVEF with moderate RV enlargement, moderate to severe TR.  Her weight decreased 5 pounds with diuresis but she continued to be more hypoxic and has required HHFNC. Hence PCCM consulted    Latest Reference Range & Units 01/24/15 15:23 01/24/15 23:36 05/30/23 02:29  pH, Ven 7.25 - 7.43    7.32  pCO2, Ven 44 - 60 mmHg   53  pO2, Ven 32 - 45 mmHg   36  TCO2 0 - 100 mmol/L 22 22.9     Pertinent  Medical History  hypothyroidism, HTN, depression,  PFT 10/29/18 >> FEV1 1.05 (68%), FEV1% 52, TLC 4.29 (96%), DLCO 44%      Significant Hospital Events: Including procedures, antibiotic start and stop dates in addition to other pertinent events   05/30/2023 - ADMIT 4/29 _ ECHO Gr1 DDx and also slight increaesd in PAP + severe Troicuspid reguf 4/30 On HHFNC 35 L / 90%. CNSULT BY DR Priscilla Cantrell 5/1 - RVP POSITIVE PIV 3 5/4 on HHFNC 45L, 70%, coming down  Interim History / Subjective:     5/6 - downt to 5L [place] at rest. Pulse ox 95%. Afebriole.  On solumedrol 40mg  IV q12h and BD nebs. Btter but very deconditioned. Sittin gi in Radiographer, therapeutic At  bedside. Wanting Mayonnaise for egg. RN says she is using BIPAP regularly. SEe by Cards -0  AFIB - [place] 7 - on eliuqis  LAST CXR 06/05/23  IMPRESSION: 1. Marked improvement in aeration of both lungs. 2. Residual bibasilar airspace opacities are worse right than left. This most likely reflects atelectasis. Infection is not excluded. 3. Bilateral pleural effusions.       Objective   Blood pressure (!) 150/71, pulse (!) 51, temperature (!) 97.5 F (36.4 C), temperature source Axillary, resp. rate 18, height 5\' 2"  (1.575 m), weight 48.2 kg, SpO2 95%.    FiO2 (%):  [30 %-70 %] 36 % PEEP:  [6 cmH20] 6 cmH20 Pressure Support:  [8 cmH20] 8 cmH20   Intake/Output Summary (Last 24 hours) at 06/09/2023 1002 Last data filed at 06/09/2023 0807 Gross per 24 hour  Intake 717 ml  Output 600 ml  Net 117 ml   Filed Weights   06/07/23 0518 06/08/23 0500 06/09/23 0500  Weight: 48.2 kg 50.6 kg 48.2 kg    Examination: General: No distress. BUT LOOKS VERY WEAK. SITTING IN CHAIR. O2 on. BIPAP at side. EATING BREAKFST Neuro: Alert and Oriented x 3. GCS 15. Speech normal Psych: Pleasant Resp:  Barrel Chest - YES.  Wheeze - no, Crackles - no, No overt respiratory distress CVS: Normal heart sounds. Murmurs - no Ext: Stigmata of Connective  Tissue Disease - no HEENT: Normal upper airway. PEERL +. No post nasal drip    LABS    PULMONARY No results for input(s): "PHART", "PCO2ART", "PO2ART", "HCO3", "TCO2", "O2SAT" in the last 168 hours.  Invalid input(s): "PCO2", "PO2"  CBC Recent Labs  Lab 06/06/23 0428 06/07/23 0349 06/09/23 0640  HGB 12.9 13.5 14.2  HCT 41.1 41.5 44.6  WBC 15.4* 17.0* 29.7*  PLT 304 300 279    COAGULATION No results for input(s): "INR" in the last 168 hours.  CARDIAC  No results for input(s): "TROPONINI" in the last 168 hours. No results for input(s): "PROBNP" in the last 168 hours.   CHEMISTRY Recent Labs  Lab 06/03/23 0416 06/04/23 0419 06/05/23 0404  06/06/23 0428 06/07/23 0349 06/09/23 0640  NA 139  --  137 137 139 133*  K 3.9  --  3.5 3.3* 3.4* 3.4*  CL 99  --  93* 89* 91* 88*  CO2 29  --  32 35* 37* 33*  GLUCOSE 153*  --  151* 200* 209* 203*  BUN 39*  --  33* 36* 38* 51*  CREATININE 0.80  --  0.82 0.93 0.76 0.87  CALCIUM  8.8*  --  8.6* 8.5* 8.5* 8.7*  MG 1.9 2.3 2.2 2.4 2.3 2.6*   Estimated Creatinine Clearance: 35.3 mL/min (by C-G formula based on SCr of 0.87 mg/dL).   LIVER Recent Labs  Lab 06/05/23 0404  AST 22  ALT 28  ALKPHOS 64  BILITOT 1.0  PROT 5.8*  ALBUMIN 2.7*     INFECTIOUS No results for input(s): "LATICACIDVEN", "PROCALCITON" in the last 168 hours.   ENDOCRINE CBG (last 3)  No results for input(s): "GLUCAP" in the last 72 hours.       IMAGING x48h  - image(s) personally visualized  -   highlighted in bold No results found.    Resolved Hospital Problem list     Assessment & Plan:  Acute on chronic hypoxic and HYPERCAPNIS respiratory failure due to parainfleunza pneumonia; and Baseline COPD Physical deocnditioning   5/6 - improved to 5L Priscilla Cantrell at rest but very deconditioned  PLAN - continue night BiPAP  - Priscilla Cantrell  was set up for an ABG done on below date and the results showed showed chronic hypercarbia  as above. Priscilla Cantrell has  chronic respiratory failure is due to  Very severe COPD  That  is life threatening.  Previous ABG's have documented high PCO2 and spirometry reveals very severe obstructive ventilatory defect c/w GOLD STAGE 4 COPD.   Patient WILL benefit from non-invasive ventilation.  Without this therapy, the patient is at high risk of ending up with worsening symptoms, worsened respiratory failure, need for ER visits and/or recurrent hospitalizations.  Bilevel device unable to adequately support patient's nocturnal ventilation needs.  Patient would benefit from NIV therapy with set tidal volumes and pressure.    - continue BD regiment (yupelri , xopenex ,  pulmicort , brovana ) - ok for triad to change Solumedrol to prednisone  and finish - Needs SNF REHAB - Opd Dr Priscilla Cantrell as below  - consider Priscilla Cantrell  or Priscilla Cantrell - Aprpeciate Priscilla Cantrell  Best Practice (right click and "Reselect all SmartList Selections" daily)    Code Status:  full code Last date of multidisciplinary goals of care discussion [NA]   FU _ K COBB and then DR Priscilla Cantrell   Future Appointments  Date Time Provider Department Center  07/15/2023  8:30 AM Roetta Clarke, NP [facility]  07/22/2023  1:40 PM McGowen, Priscilla Aly, MD [facility]   PCCM will sign off  SIGNATURE    Dr. Maire Scot, M.D., F.C.C.P,  Pulmonary and Critical Care Medicine Staff Physician, [facility] Center Director - Interstitial Lung Disease  Program  Pulmonary Fibrosis [facility] at [facility] [place], [place], [address]   Pager: [phone], If no answer  -> Check AMION or Try [phone] Telephone (clinical office): [phone] Telephone (research): [phone]  10:16 AM 06/09/2023

## 2023-06-09 NOTE — Plan of Care (Signed)

## 2023-06-09 NOTE — Progress Notes (Signed)
 Pt placed on BIPAP at this time. Pt is tolerating well with no distress noted. RN notified. Will continue to monitor pt     06/09/23 2322  BiPAP/CPAP/SIPAP  BiPAP/CPAP/SIPAP Pt Type Adult  BiPAP/CPAP/SIPAP SERVO  Mask Type Full face mask  Dentures removed? Yes - Placed in denture cup  Mask Size Small  Set Rate 15 breaths/min  Respiratory Rate 20 breaths/min  IPAP 14 cmH20  EPAP 6 cmH2O  Pressure Support 8 cmH20  PEEP 6 cmH20  FiO2 (%) 40 %  Minute Ventilation 10.2  Leak 38  Peak Inspiratory Pressure (PIP) 14  Tidal Volume (Vt) 457  Patient Home Machine No  Patient Home Mask No  Patient Home Tubing No  Auto Titrate No  Press High Alarm 30 cmH2O  Press Low Alarm 5 cmH2O  Nasal massage performed No (comment)  CPAP/SIPAP surface wiped down Yes  Device Plugged into RED Power Outlet Yes  Oxygen  Percent 40 %  BiPAP/CPAP /SiPAP Vitals  Pulse Rate 61  Resp 20  SpO2 96 %  MEWS Score/Color  MEWS Score 0  MEWS Score Color Green

## 2023-06-09 NOTE — Progress Notes (Signed)
 PROGRESS NOTE    Priscilla Cantrell  ION:[id number] DOB: 1935/04/20 DOA: 05/30/2023 PCP: Shelvia Dick, MD   Brief Narrative:  88 y.o. female with medical history significant for CKD stage II, depression, chronic hypoxic respiratory failure due to COPD on 2 L at baseline presented with worsening shortness of breath and cough and was admitted for sepsis due to community-acquired pneumonia along with acute on chronic hypoxic respiratory failure and COPD and CHF exacerbation.  She required BiPAP on presentation.  She has been on IV antibiotics, Solu-Medrol  and Lasix .  Cardiology following.  Patient continues to require high flow nasal cannula oxygen  during the day and BiPAP at night.  Pulmonary consulted as well.  Palliative care consulted for goals of care discussion.  Assessment & Plan:   Acute on chronic hypoxic respiratory failure Sepsis: Present on admission Community-acquired pneumonia Possible COPD exacerbation Acute on chronic diastolic CHF - Respiratory status improving.  Was requiring up to 50L high flow nasal cannula oxygen  but this morning she is at 4-6 L-nasal cannula oxygen .  Still requiring BiPAP at night.  Completed a course of IV Levaquin  and discontinued on 06/07/2023.  Decrease IV Solu-Medrol  to 40 mg every 12 hours.  Continue current nebs.  Pulmonary following.  Respiratory panel PCR positive for parainfluenza virus 3 - Cardiology following.  Diuretics as per cardiology.  Echo showed EF of 60 to 65% with grade 1 diastolic dysfunction.  Continue strict input and output.  Daily weight.  Fluid restriction.  Negative balance of 5625.4 cc since admission. - Palliative care following.  Patient remains full code.  Paroxysmal A-fib with RVR - Currently rate controlled mostly.  Patient has been started on Eliquis  age appropriate dosing by cardiology.  Continue metoprolol  succinate and Cardizem   Leukocytosis - Worsening.  Possibly from steroid use.  Monitor.  CKD stage II -  Creatinine stable.  Monitor  Hypokalemia - Replace.  Repeat a.m. labs.  Hyponatremia - Mild.  Monitor.  Encourage oral intake.  Hypothyroidism -Continue levothyroxine   Hypertension -Monitor blood pressure.  Continue Cardizem , metoprolol  succinate.  Amlodipine  on hold currently  GERD -Continue Protonix   Physical deconditioning - PT recommending SNF placement.  Will consult TOC once respiratory status improves  Oral thrush - Continue nystatin    DVT prophylaxis: Eliquis  Code Status: Full Family Communication: Husband on phone on 06/05/23 Disposition Plan: Status is: Inpatient Remains inpatient appropriate because: Of severity of illness  Consultants: Cardiology.  pulmonary.  Palliative care  Procedures: 2D echo  Antimicrobials:  Anti-infectives (From admission, onward)    Start     Dose/Rate Route Frequency Ordered Stop   06/05/23 1800  levofloxacin  (LEVAQUIN ) IVPB 750 mg  Status:  Discontinued        750 mg 100 mL/hr over 90 Minutes Intravenous Every 48 hours 06/05/23 1142 06/07/23 1135   05/31/23 1730  levofloxacin  (LEVAQUIN ) IVPB 500 mg  Status:  Discontinued        500 mg 100 mL/hr over 60 Minutes Intravenous Every 24 hours 05/31/23 1631 06/05/23 1142   05/30/23 0900  doxycycline  (VIBRAMYCIN ) 100 mg in sodium chloride  0.9 % 250 mL IVPB  Status:  Discontinued        100 mg 125 mL/hr over 120 Minutes Intravenous Every 12 hours 05/30/23 0848 05/31/23 1631   05/30/23 0045  aztreonam  (AZACTAM ) 2 g in sodium chloride  0.9 % 100 mL IVPB        2 g 200 mL/hr over 30 Minutes Intravenous  Once 05/30/23 0037 05/30/23 0323   05/30/23  0045  metroNIDAZOLE  (FLAGYL ) IVPB 500 mg        500 mg 100 mL/hr over 60 Minutes Intravenous  Once 05/30/23 0037 05/30/23 0323   05/30/23 0045  vancomycin  (VANCOCIN ) IVPB 1000 mg/200 mL premix        1,000 mg 200 mL/hr over 60 Minutes Intravenous  Once 05/30/23 0037 05/30/23 0323        Subjective: Patient seen and examined at bedside.   Breathing is slightly improving.  No chest pain, fever, vomiting reported.   Objective: Vitals:   06/09/23 0500 06/09/23 0505 06/09/23 0600 06/09/23 0700  BP:  (!) 150/71    Pulse:  (!) 51    Resp:  18    Temp:  (!) 97.5 F (36.4 C)    TempSrc:  Axillary    SpO2:  90% 92% 98%  Weight: 48.2 kg     Height:        Intake/Output Summary (Last 24 hours) at 06/09/2023 0748 Last data filed at 06/09/2023 0600 Gross per 24 hour  Intake 1054 ml  Output 450 ml  Net 604 ml   Filed Weights   06/07/23 0518 06/08/23 0500 06/09/23 0500  Weight: 48.2 kg 50.6 kg 48.2 kg    Examination:  General: On 4 to 6 L high flow nasal cannula oxygen .  No acute distress.  Looks chronically ill and deconditioned. ENT/neck: No obvious elevated JVD or palpable neck masses noted  respiratory: Bilateral decreased breath sounds at bases with scattered crackles and some wheezing CVS: Mild intermittent bradycardia present; S1-S2 heard  abdominal: Soft, nontender, remains slightly distended; no organomegaly, normal bowel sounds heard Extremities: No clubbing; mild lower extremity edema present CNS: Still slow to respond and a poor historian.  Awake.  No obvious focal deficits  lymph: No lymphadenopathy palpable skin: No obvious petechia/lesions psych: Currently not agitated with mostly flat affect.   musculoskeletal: No obvious joint tenderness/deformity  data Reviewed: I have personally reviewed following labs and imaging studies  CBC: Recent Labs  Lab 06/05/23 0404 06/06/23 0428 06/07/23 0349 06/09/23 0640  WBC 15.8* 15.4* 17.0* 29.7*  NEUTROABS 14.0* 14.1* 15.9* PENDING  HGB 12.6 12.9 13.5 14.2  HCT 38.6 41.1 41.5 44.6  MCV 89.4 90.7 88.5 89.7  PLT 305 304 300 279   Basic Metabolic Panel: Recent Labs  Lab 06/03/23 0416 06/04/23 0419 06/05/23 0404 06/06/23 0428 06/07/23 0349 06/09/23 0640  NA 139  --  137 137 139 133*  K 3.9  --  3.5 3.3* 3.4* 3.4*  CL 99  --  93* 89* 91* 88*  CO2 29  --   32 35* 37* 33*  GLUCOSE 153*  --  151* 200* 209* 203*  BUN 39*  --  33* 36* 38* 51*  CREATININE 0.80  --  0.82 0.93 0.76 0.87  CALCIUM  8.8*  --  8.6* 8.5* 8.5* 8.7*  MG 1.9 2.3 2.2 2.4 2.3 2.6*   GFR: Estimated Creatinine Clearance: 35.3 mL/min (by C-G formula based on SCr of 0.87 mg/dL). Liver Function Tests: Recent Labs  Lab 06/05/23 0404  AST 22  ALT 28  ALKPHOS 64  BILITOT 1.0  PROT 5.8*  ALBUMIN 2.7*   No results for input(s): "LIPASE", "AMYLASE" in the last 168 hours. No results for input(s): "AMMONIA" in the last 168 hours. Coagulation Profile: No results for input(s): "INR", "PROTIME" in the last 168 hours.  Cardiac Enzymes: No results for input(s): "CKTOTAL", "CKMB", "CKMBINDEX", "TROPONINI" in the last 168 hours. BNP (last 3 results)  No results for input(s): "PROBNP" in the last 8760 hours. HbA1C: No results for input(s): "HGBA1C" in the last 72 hours. CBG: No results for input(s): "GLUCAP" in the last 168 hours. Lipid Profile: No results for input(s): "CHOL", "HDL", "LDLCALC", "TRIG", "CHOLHDL", "LDLDIRECT" in the last 72 hours. Thyroid  Function Tests: No results for input(s): "TSH", "T4TOTAL", "FREET4", "T3FREE", "THYROIDAB" in the last 72 hours.  Anemia Panel: No results for input(s): "VITAMINB12", "FOLATE", "FERRITIN", "TIBC", "IRON", "RETICCTPCT" in the last 72 hours. Sepsis Labs: No results for input(s): "PROCALCITON", "LATICACIDVEN" in the last 168 hours.   Recent Results (from the past 240 hours)  Respiratory (~20 pathogens) panel by PCR     Status: Abnormal   Collection Time: 06/04/23  1:50 PM   Specimen: Nasopharyngeal Swab; Respiratory  Result Value Ref Range Status   Adenovirus NOT DETECTED NOT DETECTED Final   Coronavirus 229E NOT DETECTED NOT DETECTED Final    Comment: (NOTE) The Coronavirus on the Respiratory Panel, DOES NOT test for the novel  Coronavirus (2019 nCoV)    Coronavirus HKU1 NOT DETECTED NOT DETECTED Final   Coronavirus  NL63 NOT DETECTED NOT DETECTED Final   Coronavirus OC43 NOT DETECTED NOT DETECTED Final   Metapneumovirus NOT DETECTED NOT DETECTED Final   Rhinovirus / Enterovirus NOT DETECTED NOT DETECTED Final   Influenza A NOT DETECTED NOT DETECTED Final   Influenza B NOT DETECTED NOT DETECTED Final   Parainfluenza Virus 1 NOT DETECTED NOT DETECTED Final   Parainfluenza Virus 2 NOT DETECTED NOT DETECTED Final   Parainfluenza Virus 3 DETECTED (A) NOT DETECTED Final   Parainfluenza Virus 4 NOT DETECTED NOT DETECTED Final   Respiratory Syncytial Virus NOT DETECTED NOT DETECTED Final   Bordetella pertussis NOT DETECTED NOT DETECTED Final   Bordetella Parapertussis NOT DETECTED NOT DETECTED Final   Chlamydophila pneumoniae NOT DETECTED NOT DETECTED Final   Mycoplasma pneumoniae NOT DETECTED NOT DETECTED Final    Comment: Performed at [facility], [address], [place], [place] [address]         Radiology Studies: No results found.       Scheduled Meds:  apixaban   2.5 mg Oral BID   arformoterol   15 mcg Nebulization BID   budesonide  (PULMICORT ) nebulizer solution  0.5 mg Nebulization BID   diltiazem   240 mg Oral Daily   feeding supplement  237 mL Oral BID BM   fluticasone   2 spray Each Nare Daily   guaiFENesin   1,200 mg Oral BID   levothyroxine   75 mcg Oral Q0600   methylPREDNISolone  (SOLU-MEDROL ) injection  80 mg Intravenous Q12H   metoprolol  succinate  25 mg Oral Daily   nystatin   5 mL Oral QID   pantoprazole   40 mg Oral BID   polyethylene glycol  17 g Oral Daily   revefenacin   175 mcg Nebulization Daily   senna-docusate  2 tablet Oral BID   venlafaxine   50 mg Oral BID   Continuous Infusions:          Audria Leather, MD [facility] 06/09/2023, 7:48 AM

## 2023-06-09 NOTE — Progress Notes (Signed)
 Daily Progress Note   Patient Name: Priscilla Cantrell       Date: 06/09/2023 DOB: November 21, 1935  Age: 88 y.o. MRN#: [id number] Attending Physician: Audria Leather, MD Primary Care Physician: Shelvia Dick, MD Admit Date: 05/30/2023 Length of Stay: 10 days  Reason for Consultation/Follow-up: Establishing goals of care  Subjective:   CC: Patient notes breathing feeling better every day.  Following up regarding complex medical decision making.  Subjective:  Reviewed EMR prior to presenting to bedside.  At time of EMR review patient's oxygen  requirements had weaned to HFNC 5L/min.  Patient receiving BiPAP support at night.  Patient continues to receive Solu-Medrol  as well for management. Reviewed cardiology note and hospitalist note for today.  Patient noted to be euvolemic on examination from cardiology and started on p.o. Lasix  and potassium supplement.  Presents to bedside to see patient.  Physical therapy present at bedside working with patient.  Patient and physical therapist noted patient worked with RN this morning walked down the hallway to the elevators.  Patient noting that she feels her breathing while still recovering is getting better daily.  Patient is wanting to work to feel better so she can eventually get home.  Patient is agreeable with going to rehab to regain her strength. Again reviewed with patient importance of continuing conversations moving forward.  Patient has become easily overwhelmed when attempting to discuss CODE STATUS and would want her husband involved in these discussions though he has had difficulty getting to the hospital as well at times as he became ill too.  Discussed referral to outpatient palliative medicine to continue important conversations moving forward.  Patient agreement with this plan.  Spent time providing emotional support via active listening.  All questions answered at that time.  Noted palliative medicine team to continue to follow with patient's  medical journey.  Objective:   Vital Signs:  BP (!) 150/71 (BP Location: Left Arm)   Pulse (!) 51   Temp (!) 97.5 F (36.4 C) (Axillary)   Resp 18   Ht 5\' 2"  (1.575 m)   Wt 48.2 kg   SpO2 95%   BMI 19.42 kg/m   Physical Exam: General: NAD, alert, sitting up in chair at bedside, frail Cardiovascular: RRR Respiratory: Slightly increased work of breathing noted, on O2 HFNC Neuro: A&Ox4, following commands easily Psych: appropriately answers all questions  Imaging:  I personally reviewed recent imaging.   Assessment & Plan:   Assessment: Patient is an 88 year old female with a past medical history of acute on chronic hypoxic respiratory failure due to parainfluenza pneumonia, acute HFpEF, pulmonary hypertension, and new onset A-fib.  PCCM and cardiology consulted for recommendations.  Palliative medicine team consulted to assist with complex medical decision making.  Recommendations/Plan: # Complex medical decision making/goals of care:  - Discussed care with patient as detailed above in HPI.  Patient can become overwhelmed and anxious when discussing advance care planning, particularly CODE STATUS.  Patient has stated that she would want her significant other Priscilla Cantrell involved in these conversations and it has been difficult to reach him as he also became ill with the same viral illness affecting the patient.  Encouraged patient regarding discussions for medical planning moving forward.  Patient agreeing with outpatient palliative medicine team referral for follow-up.  Palliative medicine team will continue to follow along and engage in conversations as able and appropriate.  - Patient does have ACP documentation naming Priscilla Cantrell as her HCPOA.  Documents also notes granting discretion of  life-prolonging measures to her healthcare agent if she has an incurable or irreversible condition that would result in her death within a relatively short period of time or if she is unconscious  into a high degree of medical certainty will never regaining consciousness.  -  Code Status: Full Code  # Psychosocial Support:  - Significant Priscilla Cantrell  # Discharge Planning: Skilled Nursing Facility for rehab with Palliative care service follow-up  - Placed Priscilla Cantrell consult to assist with outpatient palliative medicine referral.  Discussed with: Patient, PT  Thank you for allowing the palliative care team to participate in the care Priscilla Cantrell.  Priscilla Libel, DO Palliative Care Provider PMT # [phone]  If patient remains symptomatic despite maximum doses, please call PMT at [phone] between 0700 and 1900. Outside of these hours, please call attending, as PMT does not have night coverage.

## 2023-06-09 NOTE — Progress Notes (Signed)
 Occupational Therapy Treatment Patient Details Name: Priscilla Cantrell MRN: [id number] DOB: Jun 22, 1935 Today's Date: 06/09/2023   History of present illness Patient is a 88 year old female who presented on 4/26 with SOB. Patient was admitted with sepsis, a fib with RVR, acute on chronic hypoxic respiratory failure, COPD exacerbation, acute on chronic diastolic CHF and CAP. Pt requiring HHFNC. 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   OT comments  Patient was able to engage in move movement during session compared to evaluation with signigicantly less O2. Patient was able to remain on 5L/min with drop to 84% with 30 seconds or less of deep breathing to return to 90s. . Patient will benefit from continued inpatient follow up therapy, <3 hours/day.       If plan is discharge home, recommend the following:  A lot of help with bathing/dressing/bathroom;A lot of help with walking and/or transfers;Assistance with cooking/housework;Direct supervision/assist for medications management;Assist for transportation;Help with stairs or ramp for entrance;Direct supervision/assist for financial management   Equipment Recommendations  None recommended by OT       Precautions / Restrictions Precautions Precautions: Fall;Posterior Hip Recall of Precautions/Restrictions: Impaired Precaution/Restrictions Comments: monitor O2 and HR. ideal range 88% to 92%; h/o multiple hip dislocations, reviewed posterior precautions (pt was able to recall 1 of 3) Restrictions Weight Bearing Restrictions Per Provider Order: No       Mobility Bed Mobility               General bed mobility comments: patient was up in recliner and returned to the same.            Balance Overall balance assessment: Needs assistance Sitting-balance support: No upper extremity supported Sitting balance-Leahy Scale:  Good     Standing balance support: Single extremity supported Standing balance-Leahy Scale: Poor                             ADL either performed or assessed with clinical judgement   ADL Overall ADL's : Needs assistance/impaired     Toilet Transfer: Contact guard assist;Rolling walker (2 wheels) Toilet Transfer Details (indicate cue type and reason): with cues for proper hand placement to SPT to and from [facility]. patient had to stand x2 for hygiene with patient noted to have loose stool. Toileting- Clothing Manipulation and Hygiene: Total assistance;Sit to/from stand Toileting - Clothing Manipulation Details (indicate cue type and reason): patient unable to manage breathing techniques needed to maintain o2 and engage in hygiene while standing at this time.              Cognition Arousal: Alert Behavior During Therapy: WFL for tasks assessed/performed Cognition: No apparent impairments           Following commands: Intact                 General Comments patient was on 5L/min but able to recover within 30 seconds with education on deep breathing. patient dropping to 83% on 5L/min with standing for hygiene and transfers.    Pertinent Vitals/ Pain       Pain Assessment Pain Assessment: No/denies pain         Frequency  Min 2X/week        Progress Toward Goals  OT Goals(current goals can now be found in the care plan section)  Progress towards OT goals: Progressing toward goals  Plan         AM-PAC OT "6 Clicks" Daily Activity     Outcome Measure   Help from another person eating meals?: A Little Help from another person taking care of personal grooming?: A Little Help from another person toileting, which includes using toliet, bedpan, or urinal?: A Lot Help from another person bathing (including washing, rinsing, drying)?: A Lot Help from another person to put on and taking off regular upper body clothing?: A Little Help from another  person to put on and taking off regular lower body clothing?: A Lot 6 Click Score: 15    End of Session Equipment Utilized During Treatment: Oxygen   OT Visit Diagnosis: Other abnormalities of gait and mobility (R26.89);Muscle weakness (generalized) (M62.81);History of falling (Z91.81)   Activity Tolerance Patient limited by fatigue   Patient Left in chair;with call bell/phone within reach   Nurse Communication Other (comment) (ok to participate and updated afterwards on voiding.)        Time: 1023-1040 OT Time Calculation (min): 17 min  Charges: OT General Charges $OT Visit: 1 Visit OT Treatments $Self Care/Home Management : 8-22 mins  Wynette Heckler, MS Acute Rehabilitation Department Office# [phone]   Jame Maze 06/09/2023, 11:30 AM

## 2023-06-09 NOTE — TOC Progression Note (Signed)
 Transition of Care [facility]) - Progression Note    Patient Details  Name: Priscilla Cantrell MRN: [id number] Date of Birth: 02/23/1935  [facility] CM/SW Contact  Gertha Ku, LCSW Phone Number: 06/09/2023, 11:44 AM  Clinical Narrative:    CSW spoke with the pt's spouse, Priscilla Cantrell, who reported that he and the pt have discussed the plan and now agree with SNF placement. He stated that they would prefer [facility], as the pt was previously there. CSW discuss the process noting pt will need insurance auth. CSW to work pt up for SNF placement. TOC to follow.    Expected Discharge Plan: Skilled Nursing Facility Barriers to Discharge: Continued Medical Work up  Expected Discharge Plan and Services       Living arrangements for the past 2 months: Single Family Home                                       Social Determinants of Health (SDOH) Interventions SDOH Screenings   Food Insecurity: No Food Insecurity (05/30/2023)  Housing: Low Risk  (05/30/2023)  Transportation Needs: No Transportation Needs (05/30/2023)  Utilities: Not At Risk (05/30/2023)  Alcohol  Screen: Low Risk  (02/01/2020)  Depression (PHQ2-9): Medium Risk (04/22/2023)  Financial Resource Strain: Low Risk  (03/19/2022)  Physical Activity: Sufficiently Active (03/19/2022)  Social Connections: Socially Integrated (05/30/2023)  Stress: No Stress Concern Present (03/19/2022)  Tobacco Use: Medium Risk (05/30/2023)    Readmission Risk Interventions     No data to display

## 2023-06-10 DIAGNOSIS — J189 Pneumonia, unspecified organism: Secondary | ICD-10-CM | POA: Diagnosis not present

## 2023-06-10 DIAGNOSIS — A419 Sepsis, unspecified organism: Secondary | ICD-10-CM | POA: Diagnosis not present

## 2023-06-10 LAB — CBC WITH DIFFERENTIAL/PLATELET
Abs Immature Granulocytes: 0.17 10*3/uL — ABNORMAL HIGH (ref 0.00–0.07)
Basophils Absolute: 0 10*3/uL (ref 0.0–0.1)
Basophils Relative: 0 %
Eosinophils Absolute: 0 10*3/uL (ref 0.0–0.5)
Eosinophils Relative: 0 %
HCT: 41 % (ref 36.0–46.0)
Hemoglobin: 13.3 g/dL (ref 12.0–15.0)
Immature Granulocytes: 1 %
Lymphocytes Relative: 2 %
Lymphs Abs: 0.3 10*3/uL — ABNORMAL LOW (ref 0.7–4.0)
MCH: 28.8 pg (ref 26.0–34.0)
MCHC: 32.4 g/dL (ref 30.0–36.0)
MCV: 88.7 fL (ref 80.0–100.0)
Monocytes Absolute: 0.6 10*3/uL (ref 0.1–1.0)
Monocytes Relative: 3 %
Neutro Abs: 18.1 10*3/uL — ABNORMAL HIGH (ref 1.7–7.7)
Neutrophils Relative %: 94 %
Platelets: 256 10*3/uL (ref 150–400)
RBC: 4.62 MIL/uL (ref 3.87–5.11)
RDW: 13.2 % (ref 11.5–15.5)
WBC: 19.2 10*3/uL — ABNORMAL HIGH (ref 4.0–10.5)
nRBC: 0 % (ref 0.0–0.2)

## 2023-06-10 LAB — BASIC METABOLIC PANEL WITH GFR
Anion gap: 11 (ref 5–15)
BUN: 56 mg/dL — ABNORMAL HIGH (ref 8–23)
CO2: 33 mmol/L — ABNORMAL HIGH (ref 22–32)
Calcium: 8.3 mg/dL — ABNORMAL LOW (ref 8.9–10.3)
Chloride: 88 mmol/L — ABNORMAL LOW (ref 98–111)
Creatinine, Ser: 0.94 mg/dL (ref 0.44–1.00)
GFR, Estimated: 59 mL/min — ABNORMAL LOW (ref >60)
Glucose, Bld: 216 mg/dL — ABNORMAL HIGH (ref 70–99)
Potassium: 4 mmol/L (ref 3.5–5.1)
Sodium: 132 mmol/L — ABNORMAL LOW (ref 135–145)

## 2023-06-10 LAB — MAGNESIUM: Magnesium: 2.6 mg/dL — ABNORMAL HIGH (ref 1.7–2.4)

## 2023-06-10 NOTE — TOC Progression Note (Signed)
 Transition of Care [facility]) - Progression Note    Patient Details  Name: Priscilla Cantrell MRN: [id number] Date of Birth: August 30, 1935  Transition of Care [facility]) CM/SW Contact  Gertha Ku, LCSW Phone Number: 06/10/2023, 2:59 PM  Clinical Narrative:     CSW has presented bed offers , pt has chosen [facility] for SNF placement. TOC to follow.    Expected Discharge Plan: Skilled Nursing Facility Barriers to Discharge: Continued Medical Work up  Expected Discharge Plan and Services       Living arrangements for the past 2 months: Single Family Home                                       Social Determinants of Health (SDOH) Interventions SDOH Screenings   Food Insecurity: No Food Insecurity (05/30/2023)  Housing: Low Risk  (05/30/2023)  Transportation Needs: No Transportation Needs (05/30/2023)  Utilities: Not At Risk (05/30/2023)  Alcohol  Screen: Low Risk  (02/01/2020)  Depression (PHQ2-9): Medium Risk (04/22/2023)  Financial Resource Strain: Low Risk  (03/19/2022)  Physical Activity: Sufficiently Active (03/19/2022)  Social Connections: Socially Integrated (05/30/2023)  Stress: No Stress Concern Present (03/19/2022)  Tobacco Use: Medium Risk (05/30/2023)    Readmission Risk Interventions     No data to display

## 2023-06-10 NOTE — Progress Notes (Signed)
 PROGRESS NOTE    Priscilla Cantrell  ZOX:[id number] DOB: 09/22/1935 DOA: 05/30/2023 PCP: Shelvia Dick, MD   Brief Narrative:  This 89 y.o. female with medical history significant for CKD stage II, depression, chronic hypoxic respiratory failure due to COPD on 2 L at baseline presented with worsening shortness of breath and cough and was admitted for sepsis due to community-acquired pneumonia along with acute on chronic hypoxic respiratory failure and COPD and CHF exacerbation.  She required BiPAP on presentation.  She has been on IV antibiotics, Solu-Medrol  and Lasix .  Cardiology following.  Patient continues to require high flow nasal cannula oxygen  during the day and BiPAP at night.  Pulmonary consulted as well.  Palliative care consulted for goals of care discussion.  Assessment & Plan:   Principal Problem:   Sepsis due to pneumonia [facility]) Active Problems:   Acute respiratory failure with hypoxia (HCC)   Acute diastolic CHF (congestive heart failure) (HCC)   Atrial fibrillation with RVR (HCC)   SVT (supraventricular tachycardia) (HCC)   COPD exacerbation (HCC)   Multifocal pneumonia   Infection due to parainfluenza virus 3   Viral pneumonia   Acute on chronic respiratory failure with hypoxia (HCC)   Pressure injury of skin   Goals of care, counseling/discussion   Palliative care encounter   Acute on chronic hypoxic respiratory failure: Sepsis secondary to community-acquired pneumonia: POA Possible COPD exacerbation: Acute on chronic diastolic CHF: -Respiratory status improving.  She was requiring up to 50L high flow nasal cannula oxygen  but this morning she is at 4-6 L-nasal cannula oxygen .  Still requiring BiPAP at night.  Completed a course of IV Levaquin  and ABX discontinued on 06/07/2023.  Decreased IV Solu-Medrol  to 40 mg every 12 hours.  Continue current nebs.  Pulmonary following.  Respiratory panel PCR positive for parainfluenza virus 3 -Cardiology following. Continue  Diuretics as per cardiology.  Echo showed EF of 60 to 65% with grade 1 diastolic dysfunction.  Continue strict input and output.  Daily weight.  Fluid restriction.  Negative balance of 5625.4 cc since admission. - Palliative care following.  Patient remains full code. -Cardiology signed off.  Continue oral Lasix . - Pulmonology recommended patient will benefit from NIV ventilation.  Patient is at high risk for ending up with worsening symptoms worsening respiratory failure recurrent ER visits recurrent hospitalizations without NIV.    Paroxysmal A-fib with RVR: -Currently heart rate well-controlled.   Patient has been started on Eliquis  age appropriate dosing by cardiology.   Continue metoprolol  succinate and Cardizem .   Leukocytosis: - Now improving. Possibly from steroid use.   - Continue to monitor.   CKD stage II: - Creatinine stable.  Creatinine at baseline.   Hypokalemia: - Replaced.  Resolved.   Hyponatremia: - Mild.  Monitor.  Encourage oral intake.   Hypothyroidism: -Continue levothyroxine    Hypertension: -Monitor blood pressure.  Continue Cardizem , metoprolol  succinate.   Amlodipine  on hold currently   GERD: -Continue Protonix    Physical deconditioning: - PT recommending SNF placement.  Will consult TOC once respiratory status improves   Oral thrush: - Continue nystatin     DVT prophylaxis: Eliquis  Code Status: Full code Family Communication:  No family at bed side Disposition Plan:   Status is: Inpatient Remains inpatient appropriate because: Severity of illness.     Consultants:  Cardiology Pulmonology  Procedures: None  Antimicrobials:  Anti-infectives (From admission, onward)    Start     Dose/Rate Route Frequency Ordered Stop   06/05/23 1800  levofloxacin  (  LEVAQUIN ) IVPB 750 mg  Status:  Discontinued        750 mg 100 mL/hr over 90 Minutes Intravenous Every 48 hours 06/05/23 1142 06/07/23 1135   05/31/23 1730  levofloxacin  (LEVAQUIN ) IVPB  500 mg  Status:  Discontinued        500 mg 100 mL/hr over 60 Minutes Intravenous Every 24 hours 05/31/23 1631 06/05/23 1142   05/30/23 0900  doxycycline  (VIBRAMYCIN ) 100 mg in sodium chloride  0.9 % 250 mL IVPB  Status:  Discontinued        100 mg 125 mL/hr over 120 Minutes Intravenous Every 12 hours 05/30/23 0848 05/31/23 1631   05/30/23 0045  aztreonam  (AZACTAM ) 2 g in sodium chloride  0.9 % 100 mL IVPB        2 g 200 mL/hr over 30 Minutes Intravenous  Once 05/30/23 0037 05/30/23 0323   05/30/23 0045  metroNIDAZOLE  (FLAGYL ) IVPB 500 mg        500 mg 100 mL/hr over 60 Minutes Intravenous  Once 05/30/23 0037 05/30/23 0323   05/30/23 0045  vancomycin  (VANCOCIN ) IVPB 1000 mg/200 mL premix        1,000 mg 200 mL/hr over 60 Minutes Intravenous  Once 05/30/23 0037 05/30/23 0323      Subjective: Patient was seen and examined at bedside.  Overnight events noted. Patient appears much improved.  She is alert and oriented,  She still remains on 4 L of oxygen . She asked when she wants to be discharged.  Objective: Vitals:   06/10/23 0500 06/10/23 0522 06/10/23 0835 06/10/23 1215  BP:    130/63  Pulse:    65  Resp:    19  Temp:    98.4 F (36.9 C)  TempSrc:    Oral  SpO2:  92% 93% 96%  Weight: 49.2 kg     Height:        Intake/Output Summary (Last 24 hours) at 06/10/2023 1259 Last data filed at 06/10/2023 0900 Gross per 24 hour  Intake 240 ml  Output --  Net 240 ml   Filed Weights   06/08/23 0500 06/09/23 0500 06/10/23 0500  Weight: 50.6 kg 48.2 kg 49.2 kg    Examination:  General exam: Appears calm and comfortable, deconditioned, not in any acute distress. Respiratory system: Clear to auscultation. Respiratory effort normal.  RR 16 Cardiovascular system: S1 & S2 heard, RRR. No JVD, murmurs, rubs, gallops or clicks.  Gastrointestinal system: Abdomen is nondistended, soft and nontender.  Normal bowel sounds heard. Central nervous system: Alert and oriented X 3. No focal  neurological deficits. Extremities: Symmetric 5 x 5 power. Skin: No rashes, lesions or ulcers Psychiatry: Judgement and insight appear normal. Mood & affect appropriate.     Data Reviewed: I have personally reviewed following labs and imaging studies  CBC: Recent Labs  Lab 06/05/23 0404 06/06/23 0428 06/07/23 0349 06/09/23 0640 06/10/23 0429  WBC 15.8* 15.4* 17.0* 29.7* 19.2*  NEUTROABS 14.0* 14.1* 15.9* 27.9* 18.1*  HGB 12.6 12.9 13.5 14.2 13.3  HCT 38.6 41.1 41.5 44.6 41.0  MCV 89.4 90.7 88.5 89.7 88.7  PLT 305 304 300 279 256   Basic Metabolic Panel: Recent Labs  Lab 06/05/23 0404 06/06/23 0428 06/07/23 0349 06/09/23 0640 06/10/23 0429  NA 137 137 139 133* 132*  K 3.5 3.3* 3.4* 3.4* 4.0  CL 93* 89* 91* 88* 88*  CO2 32 35* 37* 33* 33*  GLUCOSE 151* 200* 209* 203* 216*  BUN 33* 36* 38* 51* 56*  CREATININE  0.82 0.93 0.76 0.87 0.94  CALCIUM  8.6* 8.5* 8.5* 8.7* 8.3*  MG 2.2 2.4 2.3 2.6* 2.6*   GFR: Estimated Creatinine Clearance: 33.4 mL/min (by C-G formula based on SCr of 0.94 mg/dL). Liver Function Tests: Recent Labs  Lab 06/05/23 0404  AST 22  ALT 28  ALKPHOS 64  BILITOT 1.0  PROT 5.8*  ALBUMIN 2.7*   No results for input(s): "LIPASE", "AMYLASE" in the last 168 hours. No results for input(s): "AMMONIA" in the last 168 hours. Coagulation Profile: No results for input(s): "INR", "PROTIME" in the last 168 hours. Cardiac Enzymes: No results for input(s): "CKTOTAL", "CKMB", "CKMBINDEX", "TROPONINI" in the last 168 hours. BNP (last 3 results) No results for input(s): "PROBNP" in the last 8760 hours. HbA1C: No results for input(s): "HGBA1C" in the last 72 hours. CBG: No results for input(s): "GLUCAP" in the last 168 hours. Lipid Profile: No results for input(s): "CHOL", "HDL", "LDLCALC", "TRIG", "CHOLHDL", "LDLDIRECT" in the last 72 hours. Thyroid  Function Tests: No results for input(s): "TSH", "T4TOTAL", "FREET4", "T3FREE", "THYROIDAB" in the last 72  hours. Anemia Panel: No results for input(s): "VITAMINB12", "FOLATE", "FERRITIN", "TIBC", "IRON", "RETICCTPCT" in the last 72 hours. Sepsis Labs: No results for input(s): "PROCALCITON", "LATICACIDVEN" in the last 168 hours.  Recent Results (from the past 240 hours)  Respiratory (~20 pathogens) panel by PCR     Status: Abnormal   Collection Time: 06/04/23  1:50 PM   Specimen: Nasopharyngeal Swab; Respiratory  Result Value Ref Range Status   Adenovirus NOT DETECTED NOT DETECTED Final   Coronavirus 229E NOT DETECTED NOT DETECTED Final    Comment: (NOTE) The Coronavirus on the Respiratory Panel, DOES NOT test for the novel  Coronavirus (2019 nCoV)    Coronavirus HKU1 NOT DETECTED NOT DETECTED Final   Coronavirus NL63 NOT DETECTED NOT DETECTED Final   Coronavirus OC43 NOT DETECTED NOT DETECTED Final   Metapneumovirus NOT DETECTED NOT DETECTED Final   Rhinovirus / Enterovirus NOT DETECTED NOT DETECTED Final   Influenza A NOT DETECTED NOT DETECTED Final   Influenza B NOT DETECTED NOT DETECTED Final   Parainfluenza Virus 1 NOT DETECTED NOT DETECTED Final   Parainfluenza Virus 2 NOT DETECTED NOT DETECTED Final   Parainfluenza Virus 3 DETECTED (A) NOT DETECTED Final   Parainfluenza Virus 4 NOT DETECTED NOT DETECTED Final   Respiratory Syncytial Virus NOT DETECTED NOT DETECTED Final   Bordetella pertussis NOT DETECTED NOT DETECTED Final   Bordetella Parapertussis NOT DETECTED NOT DETECTED Final   Chlamydophila pneumoniae NOT DETECTED NOT DETECTED Final   Mycoplasma pneumoniae NOT DETECTED NOT DETECTED Final    Comment: Performed at [facility], [address] [place], [place] [address]    Radiology Studies: No results found. Scheduled Meds:  apixaban   2.5 mg Oral BID   arformoterol   15 mcg Nebulization BID   budesonide  (PULMICORT ) nebulizer solution  0.5 mg Nebulization BID   diltiazem   240 mg Oral Daily   feeding supplement  237 mL Oral BID BM   fluticasone   2 spray Each Nare  Daily   furosemide   60 mg Oral Daily   guaiFENesin   1,200 mg Oral BID   levothyroxine   75 mcg Oral Q0600   methylPREDNISolone  (SOLU-MEDROL ) injection  40 mg Intravenous Q12H   metoprolol  succinate  25 mg Oral Daily   nystatin   5 mL Oral QID   pantoprazole   40 mg Oral BID   polyethylene glycol  17 g Oral Daily   potassium chloride   20 mEq  Oral Daily   revefenacin   175 mcg Nebulization Daily   senna-docusate  2 tablet Oral BID   venlafaxine   50 mg Oral BID   Continuous Infusions:   LOS: 11 days    Time spent: 50 mins    Magdalene School, MD [facility]   If 7PM-7AM, please contact night-coverage

## 2023-06-10 NOTE — Plan of Care (Signed)
 Pt up to chair Family at bedside All scheduled meds given   Problem: Education: Goal: Knowledge of General Education information will improve Description: Including pain rating scale, medication(s)/side effects and non-pharmacologic comfort measures Outcome: Progressing   Problem: Clinical Measurements: Goal: Ability to maintain clinical measurements within normal limits will improve Outcome: Progressing

## 2023-06-10 NOTE — Progress Notes (Signed)
   06/10/23 2200  BiPAP/CPAP/SIPAP  $ Face Mask Small Yes  BiPAP/CPAP/SIPAP Pt Type Adult  BiPAP/CPAP/SIPAP SERVO  Mask Type Full face mask  Dentures removed? Yes - Placed in denture cup  Mask Size Small  Set Rate 15 breaths/min  Respiratory Rate 22 breaths/min  IPAP 14 cmH20  EPAP 6 cmH2O  FiO2 (%) 40 %  Minute Ventilation 11.1  Leak 25  Peak Inspiratory Pressure (PIP) 16  Tidal Volume (Vt) 422  Patient Home Machine No  Patient Home Mask No  Patient Home Tubing No  Auto Titrate No  Press High Alarm 30 cmH2O  Press Low Alarm 5 cmH2O  Nasal massage performed No (comment)  CPAP/SIPAP surface wiped down Yes  Device Plugged into RED Power Outlet Yes  Oxygen  Percent 40 %  BiPAP/CPAP /SiPAP Vitals  Pulse Rate 66  Resp 20  BP 130/63  SpO2 96 %  Bilateral Breath Sounds Clear;Diminished  MEWS Score/Color  MEWS Score 0  MEWS Score Color Green

## 2023-06-11 DIAGNOSIS — A419 Sepsis, unspecified organism: Secondary | ICD-10-CM | POA: Diagnosis not present

## 2023-06-11 DIAGNOSIS — J189 Pneumonia, unspecified organism: Secondary | ICD-10-CM | POA: Diagnosis not present

## 2023-06-11 MED ORDER — PHENOL 1.4 % MT LIQD
1.0000 | OROMUCOSAL | Status: DC | PRN
  Administered 2023-06-11: 1 via OROMUCOSAL
  Filled 2023-06-11: qty 177

## 2023-06-11 MED ORDER — LIDOCAINE VISCOUS HCL 2 % MT SOLN
15.0000 mL | Freq: Four times a day (QID) | OROMUCOSAL | Status: AC | PRN
  Administered 2023-06-11 (×2): 15 mL via OROMUCOSAL
  Filled 2023-06-11 (×3): qty 15

## 2023-06-11 NOTE — Progress Notes (Signed)
 PROGRESS NOTE    Priscilla Cantrell  GEX:[id number] DOB: 1935-07-31 DOA: 05/30/2023 PCP: Shelvia Dick, MD   Brief Narrative:  This 88 y.o. female with medical history significant for CKD stage II, depression, chronic hypoxic respiratory failure due to COPD on 2 L at baseline presented with worsening shortness of breath and cough and was admitted for sepsis due to community-acquired pneumonia along with acute on chronic hypoxic respiratory failure and COPD and CHF exacerbation.  She required BiPAP on presentation.  She has been on IV antibiotics, Solu-Medrol  and Lasix .  Cardiology following.  Patient continues to require high flow nasal cannula oxygen  during the day and BiPAP at night.  Pulmonary consulted as well.  Palliative care consulted for goals of care discussion.  Assessment & Plan:   Principal Problem:   Sepsis due to pneumonia [facility]) Active Problems:   Acute respiratory failure with hypoxia (HCC)   Acute diastolic CHF (congestive heart failure) (HCC)   Atrial fibrillation with RVR (HCC)   SVT (supraventricular tachycardia) (HCC)   COPD exacerbation (HCC)   Multifocal pneumonia   Infection due to parainfluenza virus 3   Viral pneumonia   Acute on chronic respiratory failure with hypoxia (HCC)   Pressure injury of skin   Goals of care, counseling/discussion   Palliative care encounter   Acute on chronic hypoxic respiratory failure: Sepsis secondary to community-acquired pneumonia: POA Possible COPD exacerbation: Acute on chronic diastolic CHF: -Respiratory status improving.  She was requiring up to 50L high flow nasal cannula oxygen  but this morning she is at 4-6 L-nasal cannula oxygen .  Still requiring BiPAP at night.  Completed a course of IV Levaquin  and ABX discontinued on 06/07/2023.  Decreased IV Solu-Medrol  to 40 mg every 12 hours.  Continue current nebs.  Pulmonary following.  Respiratory panel PCR positive for parainfluenza virus 3 -Cardiology following. Continue  Diuretics as per cardiology.  Echo showed EF of 60 to 65% with grade 1 diastolic dysfunction.  Continue strict input and output.  Daily weight.  Fluid restriction.  Negative balance of 5625.4 cc since admission. - Palliative care following.  Patient remains full code. -Cardiology signed off.  Continue oral Lasix . - Pulmonology recommended patient will benefit from NIV ventilation.  Patient is at high risk for ending up with worsening symptoms worsening respiratory failure recurrent ER visits recurrent hospitalizations without NIV.    Paroxysmal A-fib with RVR: -Currently heart rate well-controlled.   Patient has been started on Eliquis  age appropriate dosing by cardiology.   Continue metoprolol  succinate and Cardizem .   Leukocytosis: - Now improving. Possibly from steroid use.   - Continue to monitor.   CKD stage II: - Creatinine stable.  Creatinine at baseline.   Hypokalemia: - Replaced.  Resolved.   Hyponatremia: - Mild.  Monitor.  Encourage oral intake.   Hypothyroidism: -Continue levothyroxine    Hypertension: -Monitor blood pressure.  Continue Cardizem , metoprolol  succinate.   Amlodipine  on hold currently   GERD: -Continue Protonix    Physical deconditioning: - PT recommending SNF placement.  Will consult TOC once respiratory status improves   Oral thrush: - Continue nystatin     DVT prophylaxis: Eliquis  Code Status: Full code Family Communication:  No family at bed side Disposition Plan:   Status is: Inpatient Remains inpatient appropriate because: Severity of illness.     Consultants:  Cardiology Pulmonology  Procedures: None  Antimicrobials:  Anti-infectives (From admission, onward)    Start     Dose/Rate Route Frequency Ordered Stop   06/05/23 1800  levofloxacin  (  LEVAQUIN ) IVPB 750 mg  Status:  Discontinued        750 mg 100 mL/hr over 90 Minutes Intravenous Every 48 hours 06/05/23 1142 06/07/23 1135   05/31/23 1730  levofloxacin  (LEVAQUIN ) IVPB  500 mg  Status:  Discontinued        500 mg 100 mL/hr over 60 Minutes Intravenous Every 24 hours 05/31/23 1631 06/05/23 1142   05/30/23 0900  doxycycline  (VIBRAMYCIN ) 100 mg in sodium chloride  0.9 % 250 mL IVPB  Status:  Discontinued        100 mg 125 mL/hr over 120 Minutes Intravenous Every 12 hours 05/30/23 0848 05/31/23 1631   05/30/23 0045  aztreonam  (AZACTAM ) 2 g in sodium chloride  0.9 % 100 mL IVPB        2 g 200 mL/hr over 30 Minutes Intravenous  Once 05/30/23 0037 05/30/23 0323   05/30/23 0045  metroNIDAZOLE  (FLAGYL ) IVPB 500 mg        500 mg 100 mL/hr over 60 Minutes Intravenous  Once 05/30/23 0037 05/30/23 0323   05/30/23 0045  vancomycin  (VANCOCIN ) IVPB 1000 mg/200 mL premix        1,000 mg 200 mL/hr over 60 Minutes Intravenous  Once 05/30/23 0037 05/30/23 0323      Subjective: Patient was seen and examined at bedside.  Overnight events noted. Patient appears much improved.  She is alert and oriented,  She still remains on 6 L of oxygen . Patient wants to participate in therapy.  Objective: Vitals:   06/10/23 2200 06/11/23 0306 06/11/23 0500 06/11/23 0946  BP: 130/63 125/77    Pulse: 66 66    Resp: 20 18    Temp:  98.2 F (36.8 C)    TempSrc:  Oral    SpO2: 96% 92%  92%  Weight:   49.8 kg   Height:        Intake/Output Summary (Last 24 hours) at 06/11/2023 1129 Last data filed at 06/10/2023 1350 Gross per 24 hour  Intake 170 ml  Output --  Net 170 ml   Filed Weights   06/09/23 0500 06/10/23 0500 06/11/23 0500  Weight: 48.2 kg 49.2 kg 49.8 kg    Examination:  General exam: Appears calm and comfortable, deconditioned, not in any acute distress. Respiratory system: CTA Bilaterally . Respiratory effort normal.  RR 16 Cardiovascular system: S1 & S2 heard, RRR. No JVD, murmurs, rubs, gallops or clicks.  Gastrointestinal system: Abdomen is nondistended, soft and nontender.  Normal bowel sounds heard. Central nervous system: Alert and oriented X 3. No focal  neurological deficits. Extremities: Symmetric 5 x 5 power. Skin: No rashes, lesions or ulcers Psychiatry: Judgement and insight appear normal. Mood & affect appropriate.     Data Reviewed: I have personally reviewed following labs and imaging studies  CBC: Recent Labs  Lab 06/05/23 0404 06/06/23 0428 06/07/23 0349 06/09/23 0640 06/10/23 0429  WBC 15.8* 15.4* 17.0* 29.7* 19.2*  NEUTROABS 14.0* 14.1* 15.9* 27.9* 18.1*  HGB 12.6 12.9 13.5 14.2 13.3  HCT 38.6 41.1 41.5 44.6 41.0  MCV 89.4 90.7 88.5 89.7 88.7  PLT 305 304 300 279 256   Basic Metabolic Panel: Recent Labs  Lab 06/05/23 0404 06/06/23 0428 06/07/23 0349 06/09/23 0640 06/10/23 0429  NA 137 137 139 133* 132*  K 3.5 3.3* 3.4* 3.4* 4.0  CL 93* 89* 91* 88* 88*  CO2 32 35* 37* 33* 33*  GLUCOSE 151* 200* 209* 203* 216*  BUN 33* 36* 38* 51* 56*  CREATININE 0.82 0.93  0.76 0.87 0.94  CALCIUM  8.6* 8.5* 8.5* 8.7* 8.3*  MG 2.2 2.4 2.3 2.6* 2.6*   GFR: Estimated Creatinine Clearance: 33.8 mL/min (by C-G formula based on SCr of 0.94 mg/dL). Liver Function Tests: Recent Labs  Lab 06/05/23 0404  AST 22  ALT 28  ALKPHOS 64  BILITOT 1.0  PROT 5.8*  ALBUMIN 2.7*   No results for input(s): "LIPASE", "AMYLASE" in the last 168 hours. No results for input(s): "AMMONIA" in the last 168 hours. Coagulation Profile: No results for input(s): "INR", "PROTIME" in the last 168 hours. Cardiac Enzymes: No results for input(s): "CKTOTAL", "CKMB", "CKMBINDEX", "TROPONINI" in the last 168 hours. BNP (last 3 results) No results for input(s): "PROBNP" in the last 8760 hours. HbA1C: No results for input(s): "HGBA1C" in the last 72 hours. CBG: No results for input(s): "GLUCAP" in the last 168 hours. Lipid Profile: No results for input(s): "CHOL", "HDL", "LDLCALC", "TRIG", "CHOLHDL", "LDLDIRECT" in the last 72 hours. Thyroid  Function Tests: No results for input(s): "TSH", "T4TOTAL", "FREET4", "T3FREE", "THYROIDAB" in the last 72  hours. Anemia Panel: No results for input(s): "VITAMINB12", "FOLATE", "FERRITIN", "TIBC", "IRON", "RETICCTPCT" in the last 72 hours. Sepsis Labs: No results for input(s): "PROCALCITON", "LATICACIDVEN" in the last 168 hours.  Recent Results (from the past 240 hours)  Respiratory (~20 pathogens) panel by PCR     Status: Abnormal   Collection Time: 06/04/23  1:50 PM   Specimen: Nasopharyngeal Swab; Respiratory  Result Value Ref Range Status   Adenovirus NOT DETECTED NOT DETECTED Final   Coronavirus 229E NOT DETECTED NOT DETECTED Final    Comment: (NOTE) The Coronavirus on the Respiratory Panel, DOES NOT test for the novel  Coronavirus (2019 nCoV)    Coronavirus HKU1 NOT DETECTED NOT DETECTED Final   Coronavirus NL63 NOT DETECTED NOT DETECTED Final   Coronavirus OC43 NOT DETECTED NOT DETECTED Final   Metapneumovirus NOT DETECTED NOT DETECTED Final   Rhinovirus / Enterovirus NOT DETECTED NOT DETECTED Final   Influenza A NOT DETECTED NOT DETECTED Final   Influenza B NOT DETECTED NOT DETECTED Final   Parainfluenza Virus 1 NOT DETECTED NOT DETECTED Final   Parainfluenza Virus 2 NOT DETECTED NOT DETECTED Final   Parainfluenza Virus 3 DETECTED (A) NOT DETECTED Final   Parainfluenza Virus 4 NOT DETECTED NOT DETECTED Final   Respiratory Syncytial Virus NOT DETECTED NOT DETECTED Final   Bordetella pertussis NOT DETECTED NOT DETECTED Final   Bordetella Parapertussis NOT DETECTED NOT DETECTED Final   Chlamydophila pneumoniae NOT DETECTED NOT DETECTED Final   Mycoplasma pneumoniae NOT DETECTED NOT DETECTED Final    Comment: Performed at [facility], [address] [place], [place] [address]    Radiology Studies: No results found. Scheduled Meds:  apixaban   2.5 mg Oral BID   arformoterol   15 mcg Nebulization BID   budesonide  (PULMICORT ) nebulizer solution  0.5 mg Nebulization BID   diltiazem   240 mg Oral Daily   feeding supplement  237 mL Oral BID BM   fluticasone   2 spray Each Nare  Daily   furosemide   60 mg Oral Daily   guaiFENesin   1,200 mg Oral BID   levothyroxine   75 mcg Oral Q0600   methylPREDNISolone  (SOLU-MEDROL ) injection  40 mg Intravenous Q12H   metoprolol  succinate  25 mg Oral Daily   nystatin   5 mL Oral QID   pantoprazole   40 mg Oral BID   polyethylene glycol  17 g Oral Daily   potassium chloride   20 mEq Oral Daily  revefenacin   175 mcg Nebulization Daily   senna-docusate  2 tablet Oral BID   venlafaxine   50 mg Oral BID   Continuous Infusions:   LOS: 12 days    Time spent: 35 mins    Magdalene School, MD [facility]   If 7PM-7AM, please contact night-coverage

## 2023-06-11 NOTE — Plan of Care (Signed)
   Problem: Health Behavior/Discharge Planning: Goal: Ability to manage health-related needs will improve Outcome: Progressing   Problem: Clinical Measurements: Goal: Will remain free from infection Outcome: Progressing

## 2023-06-11 NOTE — Progress Notes (Signed)
 Physical Therapy Treatment Patient Details Name: Priscilla Cantrell MRN: [id number] DOB: [birth year] Today's Date: 06/11/2023   History of Present Illness Patient is a 88 year old female who presented on 4/26 with SOB. Patient was admitted with sepsis, a fib with RVR, acute on chronic hypoxic respiratory failure, COPD exacerbation, acute on chronic diastolic CHF and CAP. Pt requiring HHFNC. 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    PT Comments  Pt tolerated increased activity, she ambulated 55' with RW and 6L O2, SpO2 89% walking. Reviewed posterior hip precautions. Performed RLE exercises with assistance.     If plan is discharge home, recommend the following: A little help with walking and/or transfers;A little help with bathing/dressing/bathroom;Assistance with cooking/housework;Assist for transportation;Help with stairs or ramp for entrance   Can travel by private vehicle        Equipment Recommendations  None recommended by PT    Recommendations for Other Services       Precautions / Restrictions Precautions Precautions: Fall;Posterior Hip Precaution Booklet Issued: No Recall of Precautions/Restrictions: Impaired Precaution/Restrictions Comments: Pt able to recall 1/3 Post. Hip Precautions. Reviewed hip precautions Restrictions Weight Bearing Restrictions Per Provider Order: No Other Position/Activity Restrictions: Right Posterior hip Revision after dislocation on 02/10/23     Mobility  Bed Mobility Overal bed mobility: Needs Assistance Bed Mobility: Supine to Sit     Supine to sit: Min assist     General bed mobility comments: min A to raise trunk    Transfers Overall transfer level: Needs assistance Equipment used: Rolling walker (2 wheels) Transfers: Sit to/from Stand Sit to Stand: Contact guard assist           General transfer comment: VCs  hand placement    Ambulation/Gait Ambulation/Gait assistance: Contact guard assist Gait Distance (Feet): 18 Feet Assistive device: Rolling walker (2 wheels) Gait Pattern/deviations: Step-to pattern, Decreased step length - right, Decreased step length - left Gait velocity: decr     General Gait Details: SpO2 89% on 6L O2 walking, distance limited by 3/4 dyspnea, no loss of balance   Stairs             Wheelchair Mobility     Tilt Bed    Modified Rankin (Stroke Patients Only)       Balance Overall balance assessment: Needs assistance Sitting-balance support: No upper extremity supported Sitting balance-Leahy Scale: Good     Standing balance support: Bilateral upper extremity supported, During functional activity, Reliant on assistive device for balance Standing balance-Leahy Scale: Fair Standing balance comment: S/CGA for gait with RW per nursing                            Communication Communication Communication: No apparent difficulties  Cognition Arousal: Alert Behavior During Therapy: WFL for tasks assessed/performed   PT - Cognitive impairments: No apparent impairments                       PT - Cognition Comments: pt stated, "I have dementia." Following commands: Intact      Cueing Cueing Techniques: Verbal cues  Exercises General Exercises - Lower Extremity Ankle Circles/Pumps: AROM, Both, 20 reps, Seated Short Arc Quad: AROM, Right, 10 reps, Supine Heel Slides: AAROM, Right, 15 reps, Supine Hip ABduction/ADduction: AAROM, Right, 10 reps, Supine Hip Flexion/Marching: AROM, Left, 10 reps, Seated    General Comments  Pertinent Vitals/Pain Pain Assessment Pain Assessment: 0-10 Pain Score: 0-No pain Faces Pain Scale: Hurts little more Pain Location: tongue Pain Descriptors / Indicators: Aching, Discomfort Pain Intervention(s): Limited activity within patient's tolerance, Monitored during session    Home Living                           Prior Function            PT Goals (current goals can now be found in the care plan section) Acute Rehab PT Goals Patient Stated Goal: likes to walk PT Goal Formulation: With patient Time For Goal Achievement: 06/17/23 Potential to Achieve Goals: Good Progress towards PT goals: Progressing toward goals    Frequency    Min 3X/week      PT Plan      Co-evaluation              AM-PAC PT "6 Clicks" Mobility   Outcome Measure  Help needed turning from your back to your side while in a flat bed without using bedrails?: A Little Help needed moving from lying on your back to sitting on the side of a flat bed without using bedrails?: A Little Help needed moving to and from a bed to a chair (including a wheelchair)?: A Little Help needed standing up from a chair using your arms (e.g., wheelchair or bedside chair)?: A Little Help needed to walk in hospital room?: A Little Help needed climbing 3-5 steps with a railing? : A Lot 6 Click Score: 17    End of Session Equipment Utilized During Treatment: Oxygen  Activity Tolerance: Patient tolerated treatment well Patient left: in chair;with call bell/phone within reach;with chair alarm set Nurse Communication: Mobility status PT Visit Diagnosis: Difficulty in walking, not elsewhere classified (R26.2);History of falling (Z91.81)     Time: 1610-9604 PT Time Calculation (min) (ACUTE ONLY): 21 min  Charges:    $Gait Training: 8-22 mins PT General Charges $$ ACUTE PT VISIT: 1 Visit                     Daymon Evans PT 06/11/2023  Acute Rehabilitation Services  Office [phone]

## 2023-06-11 NOTE — TOC Progression Note (Signed)
 Transition of Care [facility]) - Progression Note    Patient Details  Name: Priscilla Cantrell MRN: [id number] Date of Birth: Jun 05, 1935  [facility]) CM/SW Contact  Gertha Ku, LCSW Phone Number: 06/11/2023, 9:43 AM  Clinical Narrative:     Pt's insurance [name] is pending. TOC to follow.   Expected Discharge Plan: Skilled Nursing Facility Barriers to Discharge: Continued Medical Work up  Expected Discharge Plan and Services       Living arrangements for the past 2 months: Single Family Home                                       Social Determinants of Health (SDOH) Interventions SDOH Screenings   Food Insecurity: No Food Insecurity (05/30/2023)  Housing: Low Risk  (05/30/2023)  Transportation Needs: No Transportation Needs (05/30/2023)  Utilities: Not At Risk (05/30/2023)  Alcohol  Screen: Low Risk  (02/01/2020)  Depression (PHQ2-9): Medium Risk (04/22/2023)  Financial Resource Strain: Low Risk  (03/19/2022)  Physical Activity: Sufficiently Active (03/19/2022)  Social Connections: Socially Integrated (05/30/2023)  Stress: No Stress Concern Present (03/19/2022)  Tobacco Use: Medium Risk (05/30/2023)    Readmission Risk Interventions     No data to display

## 2023-06-12 DIAGNOSIS — J189 Pneumonia, unspecified organism: Secondary | ICD-10-CM | POA: Diagnosis not present

## 2023-06-12 DIAGNOSIS — A419 Sepsis, unspecified organism: Secondary | ICD-10-CM | POA: Diagnosis not present

## 2023-06-12 LAB — CBC
HCT: 43.1 % (ref 36.0–46.0)
Hemoglobin: 13.4 g/dL (ref 12.0–15.0)
MCH: 28.6 pg (ref 26.0–34.0)
MCHC: 31.1 g/dL (ref 30.0–36.0)
MCV: 92.1 fL (ref 80.0–100.0)
Platelets: 214 10*3/uL (ref 150–400)
RBC: 4.68 MIL/uL (ref 3.87–5.11)
RDW: 13.2 % (ref 11.5–15.5)
WBC: 17.2 10*3/uL — ABNORMAL HIGH (ref 4.0–10.5)
nRBC: 0 % (ref 0.0–0.2)

## 2023-06-12 LAB — BASIC METABOLIC PANEL WITH GFR
Anion gap: 11 (ref 5–15)
BUN: 45 mg/dL — ABNORMAL HIGH (ref 8–23)
CO2: 31 mmol/L (ref 22–32)
Calcium: 8.3 mg/dL — ABNORMAL LOW (ref 8.9–10.3)
Chloride: 92 mmol/L — ABNORMAL LOW (ref 98–111)
Creatinine, Ser: 0.76 mg/dL (ref 0.44–1.00)
GFR, Estimated: >60 mL/min (ref >60)
Glucose, Bld: 210 mg/dL — ABNORMAL HIGH (ref 70–99)
Potassium: 4.4 mmol/L (ref 3.5–5.1)
Sodium: 134 mmol/L — ABNORMAL LOW (ref 135–145)

## 2023-06-12 LAB — PHOSPHORUS: Phosphorus: 3.8 mg/dL (ref 2.5–4.6)

## 2023-06-12 LAB — MAGNESIUM: Magnesium: 2.5 mg/dL — ABNORMAL HIGH (ref 1.7–2.4)

## 2023-06-12 MED ORDER — PREDNISONE 20 MG PO TABS
30.0000 mg | ORAL_TABLET | Freq: Every day | ORAL | Status: DC
  Administered 2023-06-16 – 2023-06-17 (×2): 30 mg via ORAL
  Filled 2023-06-12 (×2): qty 1

## 2023-06-12 MED ORDER — PREDNISONE 10 MG PO TABS: 10.0000 mg | ORAL_TABLET | Freq: Every day | ORAL | Status: DC

## 2023-06-12 MED ORDER — PREDNISONE 20 MG PO TABS
40.0000 mg | ORAL_TABLET | Freq: Every day | ORAL | Status: AC
  Administered 2023-06-12 – 2023-06-15 (×4): 40 mg via ORAL
  Filled 2023-06-12 (×4): qty 2

## 2023-06-12 MED ORDER — PREDNISONE 20 MG PO TABS: 20.0000 mg | ORAL_TABLET | Freq: Every day | ORAL | Status: DC

## 2023-06-12 NOTE — Plan of Care (Signed)
 Pt up to chair 6L [place] No acute changes   Problem: Education: Goal: Knowledge of General Education information will improve Description: Including pain rating scale, medication(s)/side effects and non-pharmacologic comfort measures Outcome: Progressing   Problem: Clinical Measurements: Goal: Will remain free from infection Outcome: Progressing

## 2023-06-12 NOTE — Progress Notes (Signed)
 PROGRESS NOTE    Priscilla Cantrell  ZOX:[id number] DOB: [birth year] DOA: 05/30/2023 PCP: Shelvia Dick, MD   Brief Narrative:  This 88 y.o. female with medical history significant for CKD stage II, depression, chronic hypoxic respiratory failure due to COPD on 2 L at baseline presented with worsening shortness of breath and cough and was admitted for sepsis due to community-acquired pneumonia along with acute on chronic hypoxic respiratory failure and COPD and CHF exacerbation.  She required BiPAP on presentation.  She has been on IV antibiotics, Solu-Medrol  and Lasix .  Cardiology following.  Patient continues to require high flow nasal cannula oxygen  during the day and BiPAP at night.  Pulmonary consulted as well.  Palliative care consulted for goals of care discussion.  Assessment & Plan:   Principal Problem:   Sepsis due to pneumonia [facility]) Active Problems:   Acute respiratory failure with hypoxia (HCC)   Acute diastolic CHF (congestive heart failure) (HCC)   Atrial fibrillation with RVR (HCC)   SVT (supraventricular tachycardia) (HCC)   COPD exacerbation (HCC)   Multifocal pneumonia   Infection due to parainfluenza virus 3   Viral pneumonia   Acute on chronic respiratory failure with hypoxia (HCC)   Pressure injury of skin   Goals of care, counseling/discussion   Palliative care encounter   Acute on chronic hypoxic respiratory failure: Sepsis secondary to community-acquired pneumonia: POA Possible COPD exacerbation: Acute on chronic diastolic CHF: -Respiratory status improving.  She was requiring up to 50L high flow nasal cannula oxygen  but this morning she is at 4-6 L-nasal cannula oxygen .  Still requiring BiPAP at night.  Completed a course of IV Levaquin  and ABX discontinued on 06/07/2023.  Stopped IV Solu-Medrol  to 40 mg every 12 hour, changed to prednisone  taper.  Continue current nebs.  Pulmonary following.  Respiratory panel PCR positive for parainfluenza virus 3 -Cardiology  following. Continue Diuretics as per cardiology.  Echo showed EF of 60 to 65% with grade 1 diastolic dysfunction.  Continue strict input and output.  Daily weight.  Fluid restriction.  Negative balance of 5625.4 cc since admission. - Palliative care following.  Patient remains full code. - Cardiology signed off.  Continue oral Lasix . - Pulmonology recommended patient will benefit from NIV ventilation.  Patient is at high risk for ending up with worsening symptoms, worsening respiratory failure ,recurrent ER visits, recurrent hospitalizations without NIV. - TOC notified to work on arranging trilogy before discharge.  Paroxysmal A-fib with RVR: -Currently heart rate is well-controlled.   Patient has been started on Eliquis  age appropriate dosing by cardiology.   Continue metoprolol  succinate and Cardizem .   Leukocytosis: - Now improving. Possibly from steroid use.   - Continue to monitor.   CKD stage II: - Creatinine stable.  Creatinine at baseline.   Hypokalemia: - Replaced.  Resolved.   Hyponatremia: - Mild.  Monitor.  Encourage oral intake.   Hypothyroidism: -Continue levothyroxine    Hypertension: -Monitor blood pressure.  Continue Cardizem , metoprolol  succinate.   Amlodipine  on hold currently   GERD: -Continue Protonix    Physical deconditioning: - PT recommending SNF placement.  Will consult TOC once respiratory status improves   Oral thrush: - Continue nystatin     DVT prophylaxis: Eliquis  Code Status: Full code Family Communication:  No family at bed side Disposition Plan:   Status is: Inpatient Remains inpatient appropriate because: Severity of illness.     Consultants:  Cardiology Pulmonology  Procedures: None  Antimicrobials:  Anti-infectives (From admission, onward)    Start  Dose/Rate Route Frequency Ordered Stop   06/05/23 1800  levofloxacin  (LEVAQUIN ) IVPB 750 mg  Status:  Discontinued        750 mg 100 mL/hr over 90 Minutes Intravenous  Every 48 hours 06/05/23 1142 06/07/23 1135   05/31/23 1730  levofloxacin  (LEVAQUIN ) IVPB 500 mg  Status:  Discontinued        500 mg 100 mL/hr over 60 Minutes Intravenous Every 24 hours 05/31/23 1631 06/05/23 1142   05/30/23 0900  doxycycline  (VIBRAMYCIN ) 100 mg in sodium chloride  0.9 % 250 mL IVPB  Status:  Discontinued        100 mg 125 mL/hr over 120 Minutes Intravenous Every 12 hours 05/30/23 0848 05/31/23 1631   05/30/23 0045  aztreonam  (AZACTAM ) 2 g in sodium chloride  0.9 % 100 mL IVPB        2 g 200 mL/hr over 30 Minutes Intravenous  Once 05/30/23 0037 05/30/23 0323   05/30/23 0045  metroNIDAZOLE  (FLAGYL ) IVPB 500 mg        500 mg 100 mL/hr over 60 Minutes Intravenous  Once 05/30/23 0037 05/30/23 0323   05/30/23 0045  vancomycin  (VANCOCIN ) IVPB 1000 mg/200 mL premix        1,000 mg 200 mL/hr over 60 Minutes Intravenous  Once 05/30/23 0037 05/30/23 0323      Subjective: Patient was seen and examined at bedside.  Overnight events noted. Patient appears much improved. She is alert and oriented,  she still remains on 4 L of supplemental oxygen . Patient wants to participate in therapy.  Objective: Vitals:   06/11/23 2039 06/12/23 0500 06/12/23 0520 06/12/23 0610  BP: (!) 121/59  (!) 155/73   Pulse: 62  (!) 54   Resp: 20  19   Temp: 97.7 F (36.5 C)  98 F (36.7 C)   TempSrc: Oral  Oral   SpO2: 95%  98% 100%  Weight:  47.7 kg    Height:        Intake/Output Summary (Last 24 hours) at 06/12/2023 1207 Last data filed at 06/11/2023 2200 Gross per 24 hour  Intake --  Output 750 ml  Net -750 ml   Filed Weights   06/10/23 0500 06/11/23 0500 06/12/23 0500  Weight: 49.2 kg 49.8 kg 47.7 kg    Examination:  General exam: Appears calm and comfortable, deconditioned, not in any acute distress. Respiratory system: CTA Bilaterally . Respiratory effort normal.  RR 16 Cardiovascular system: S1 & S2 heard, RRR. No JVD, murmurs, rubs, gallops or clicks.  Gastrointestinal system:  Abdomen is nondistended, soft and nontender.  Normal bowel sounds heard. Central nervous system: Alert and oriented X 3. No focal neurological deficits. Extremities: Symmetric 5 x 5 power. Skin: No rashes, lesions or ulcers Psychiatry: Judgement and insight appear normal. Mood & affect appropriate.     Data Reviewed: I have personally reviewed following labs and imaging studies  CBC: Recent Labs  Lab 06/06/23 0428 06/07/23 0349 06/09/23 0640 06/10/23 0429 06/12/23 0411  WBC 15.4* 17.0* 29.7* 19.2* 17.2*  NEUTROABS 14.1* 15.9* 27.9* 18.1*  --   HGB 12.9 13.5 14.2 13.3 13.4  HCT 41.1 41.5 44.6 41.0 43.1  MCV 90.7 88.5 89.7 88.7 92.1  PLT 304 300 279 256 214   Basic Metabolic Panel: Recent Labs  Lab 06/06/23 0428 06/07/23 0349 06/09/23 0640 06/10/23 0429 06/12/23 0411  NA 137 139 133* 132* 134*  K 3.3* 3.4* 3.4* 4.0 4.4  CL 89* 91* 88* 88* 92*  CO2 35* 37* 33* 33*  31  GLUCOSE 200* 209* 203* 216* 210*  BUN 36* 38* 51* 56* 45*  CREATININE 0.93 0.76 0.87 0.94 0.76  CALCIUM  8.5* 8.5* 8.7* 8.3* 8.3*  MG 2.4 2.3 2.6* 2.6* 2.5*  PHOS  --   --   --   --  3.8   GFR: Estimated Creatinine Clearance: 38 mL/min (by C-G formula based on SCr of 0.76 mg/dL). Liver Function Tests: No results for input(s): "AST", "ALT", "ALKPHOS", "BILITOT", "PROT", "ALBUMIN" in the last 168 hours.  No results for input(s): "LIPASE", "AMYLASE" in the last 168 hours. No results for input(s): "AMMONIA" in the last 168 hours. Coagulation Profile: No results for input(s): "INR", "PROTIME" in the last 168 hours. Cardiac Enzymes: No results for input(s): "CKTOTAL", "CKMB", "CKMBINDEX", "TROPONINI" in the last 168 hours. BNP (last 3 results) No results for input(s): "PROBNP" in the last 8760 hours. HbA1C: No results for input(s): "HGBA1C" in the last 72 hours. CBG: No results for input(s): "GLUCAP" in the last 168 hours. Lipid Profile: No results for input(s): "CHOL", "HDL", "LDLCALC", "TRIG",  "CHOLHDL", "LDLDIRECT" in the last 72 hours. Thyroid  Function Tests: No results for input(s): "TSH", "T4TOTAL", "FREET4", "T3FREE", "THYROIDAB" in the last 72 hours. Anemia Panel: No results for input(s): "VITAMINB12", "FOLATE", "FERRITIN", "TIBC", "IRON", "RETICCTPCT" in the last 72 hours. Sepsis Labs: No results for input(s): "PROCALCITON", "LATICACIDVEN" in the last 168 hours.  Recent Results (from the past 240 hours)  Respiratory (~20 pathogens) panel by PCR     Status: Abnormal   Collection Time: 06/04/23  1:50 PM   Specimen: Nasopharyngeal Swab; Respiratory  Result Value Ref Range Status   Adenovirus NOT DETECTED NOT DETECTED Final   Coronavirus 229E NOT DETECTED NOT DETECTED Final    Comment: (NOTE) The Coronavirus on the Respiratory Panel, DOES NOT test for the novel  Coronavirus (2019 nCoV)    Coronavirus HKU1 NOT DETECTED NOT DETECTED Final   Coronavirus NL63 NOT DETECTED NOT DETECTED Final   Coronavirus OC43 NOT DETECTED NOT DETECTED Final   Metapneumovirus NOT DETECTED NOT DETECTED Final   Rhinovirus / Enterovirus NOT DETECTED NOT DETECTED Final   Influenza A NOT DETECTED NOT DETECTED Final   Influenza B NOT DETECTED NOT DETECTED Final   Parainfluenza Virus 1 NOT DETECTED NOT DETECTED Final   Parainfluenza Virus 2 NOT DETECTED NOT DETECTED Final   Parainfluenza Virus 3 DETECTED (A) NOT DETECTED Final   Parainfluenza Virus 4 NOT DETECTED NOT DETECTED Final   Respiratory Syncytial Virus NOT DETECTED NOT DETECTED Final   Bordetella pertussis NOT DETECTED NOT DETECTED Final   Bordetella Parapertussis NOT DETECTED NOT DETECTED Final   Chlamydophila pneumoniae NOT DETECTED NOT DETECTED Final   Mycoplasma pneumoniae NOT DETECTED NOT DETECTED Final    Comment: Performed at [facility], [address], [place], [place] [address]    Radiology Studies: No results found. Scheduled Meds:  apixaban   2.5 mg Oral BID   arformoterol   15 mcg Nebulization BID   budesonide   (PULMICORT ) nebulizer solution  0.5 mg Nebulization BID   diltiazem   240 mg Oral Daily   feeding supplement  237 mL Oral BID BM   fluticasone   2 spray Each Nare Daily   furosemide   60 mg Oral Daily   guaiFENesin   1,200 mg Oral BID   levothyroxine   75 mcg Oral Q0600   metoprolol  succinate  25 mg Oral Daily   nystatin   5 mL Oral QID   pantoprazole   40 mg Oral BID   polyethylene glycol  17 g Oral Daily   potassium chloride   20 mEq Oral Daily   predniSONE   40 mg Oral Q breakfast   Followed by   Cecily Cohen ON 06/16/2023] predniSONE   30 mg Oral Q breakfast   Followed by   Cecily Cohen ON 06/20/2023] predniSONE   20 mg Oral Q breakfast   Followed by   Cecily Cohen ON 06/24/2023] predniSONE   10 mg Oral Q breakfast   revefenacin   175 mcg Nebulization Daily   senna-docusate  2 tablet Oral BID   venlafaxine   50 mg Oral BID   Continuous Infusions:   LOS: 13 days    Time spent: 35 mins    Magdalene School, MD [facility]   If 7PM-7AM, please contact night-coverage

## 2023-06-12 NOTE — Progress Notes (Signed)
 Physical Therapy Treatment Patient Details Name: Priscilla Cantrell MRN: [id number] DOB: 11-23-1935 Today's Date: 06/12/2023   History of Present Illness Patient is a 88 year old female who presented on 4/26 with SOB. Patient was admitted with sepsis, a fib with RVR, acute on chronic hypoxic respiratory failure, COPD exacerbation, acute on chronic diastolic CHF and CAP. Pt required HHFNC.  Pt's respiratory status improving but currently on 6L however requiring BiPAP at night. 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    PT Comments  Pt very agreeable to ambulate however distance limited by 3/4 dyspnea.  Pt was able to progress to 30 ft of ambulation today and remained on 6L O2 [place] with SPO2 94-100% during ambulation.  Current d/c plan for SNF.     If plan is discharge home, recommend the following: A little help with walking and/or transfers;A little help with bathing/dressing/bathroom;Assistance with cooking/housework;Assist for transportation;Help with stairs or ramp for entrance   Can travel by private vehicle     Yes  Equipment Recommendations  None recommended by PT    Recommendations for Other Services       Precautions / Restrictions Precautions Precautions: Fall;Posterior Hip Recall of Precautions/Restrictions: Impaired Precaution/Restrictions Comments: Pt able to recall 2/3 Post. Hip Precautions. Reviewed hip precautions     Mobility  Bed Mobility               General bed mobility comments: pt in recliner    Transfers Overall transfer level: Needs assistance Equipment used: Rolling walker (2 wheels) Transfers: Sit to/from Stand Sit to Stand: Contact guard assist           General transfer comment: verbal cues for hand placement and precautions, pt pressing knees together to perform sit <->stands    Ambulation/Gait Ambulation/Gait assistance:  Contact guard assist Gait Distance (Feet): 30 Feet Assistive device: Rolling walker (2 wheels) Gait Pattern/deviations: Decreased stride length, Step-through pattern Gait velocity: decr     General Gait Details: SpO2 94-100% on 6L O2 [place], distance limited by 3/4 dyspnea and increased work of breathing   Stairs             Wheelchair Mobility     Tilt Bed    Modified Rankin (Stroke Patients Only)       Balance Overall balance assessment: Needs assistance         Standing balance support: Bilateral upper extremity supported, During functional activity, Reliant on assistive device for balance Standing balance-Leahy Scale: Poor                              Communication Communication Communication: No apparent difficulties  Cognition Arousal: Alert Behavior During Therapy: WFL for tasks assessed/performed   PT - Cognitive impairments: Problem solving, Safety/Judgement                       PT - Cognition Comments: still not able to to recall all her posterior hip precautions Following commands: Intact      Cueing Cueing Techniques: Verbal cues  Exercises      General Comments        Pertinent Vitals/Pain Pain Assessment Pain Assessment: Faces Faces Pain Scale: Hurts little more Pain Location: headache end of session Pain Descriptors / Indicators: Headache Pain Intervention(s): Repositioned, Monitored during session, Patient requesting pain meds-RN notified    Home Living  Prior Function            PT Goals (current goals can now be found in the care plan section) Progress towards PT goals: Progressing toward goals    Frequency    Min 3X/week      PT Plan      Co-evaluation              AM-PAC PT "6 Clicks" Mobility   Outcome Measure  Help needed turning from your back to your side while in a flat bed without using bedrails?: A Little Help needed moving from lying on  your back to sitting on the side of a flat bed without using bedrails?: A Little Help needed moving to and from a bed to a chair (including a wheelchair)?: A Little Help needed standing up from a chair using your arms (e.g., wheelchair or bedside chair)?: A Little Help needed to walk in hospital room?: A Little Help needed climbing 3-5 steps with a railing? : A Lot 6 Click Score: 17    End of Session Equipment Utilized During Treatment: Gait belt;Oxygen  Activity Tolerance: Patient tolerated treatment well;Patient limited by fatigue Patient left: in chair;with call bell/phone within reach;with chair alarm set Nurse Communication: Mobility status PT Visit Diagnosis: Difficulty in walking, not elsewhere classified (R26.2);History of falling (Z91.81)     Time: 1610-9604 PT Time Calculation (min) (ACUTE ONLY): 13 min  Charges:    $Gait Training: 8-22 mins PT General Charges $$ ACUTE PT VISIT: 1 Visit                     Blanch Bunde, DPT Physical Therapist Acute Rehabilitation Services Office: [phone]    Priscilla Cantrell 06/12/2023, 12:52 PM

## 2023-06-12 NOTE — TOC Progression Note (Addendum)
 Transition of Care [facility]) - Progression Note    Patient Details  Name: Priscilla Cantrell MRN: [id number] Date of Birth: [birth year]  Transition of Care [facility]) CM/SW Contact  Gertha Ku, LCSW Phone Number: 06/12/2023, 9:28 AM  Clinical Narrative:     Pt's insurance [name] is pending.   Pt is recommended for NIV , CSW contact [facility] to initiate the process. TOC to follow.  [place]  10:00am CSW received call from [facility], they have rescinded their bed offers. They can not take pt with an NIV.   CSW met with the pt and provided an update. The pt requested that CSW speak with her significant other. CSW spoke with Mr. Royston Cornea, who agreed to have the pt's information faxed out to additional facilities for review. CSW discussed the barriers to SNF placement with both the pt and her significant other. TOC to follow.  11:30am Pt insurance auth went to peer to peer, due by 3:30pm today. [phone] option 5. MD made aware. TOC to follow.   12:30pm  Per Respiratory Therapy, the pt is on a BiPAP, not NIV. CSW contacted [facility] to update the order and requested that the BiPAP be delivered to the pt's room prior to discharge.  CSW spoke with Kristin from [facility], who stated that they can accept the pt with a BiPAP, but only if the pt brings her own. If authorization is approved, the pt can be discharged to the facility on Monday.  Pt's insurance auth still pending at this time.TOC to follow.  4:00pm Pt's insurance [name] was denied for SNF placement. Will need to resubmit once pt is medically stable for d/c.   Per [facility] pt will need to qualify for Bipap-Dx:like COPD,ABG'spCO2 of 52 or greater, currently 45,also pt will need overnight oximetry. on prescribed liter flow, MD made aware. TOC to follow.    Expected Discharge Plan: Skilled Nursing Facility   Expected Discharge Plan and Services       Living arrangements for the past 2 months: Single  Family Home                                       Social Determinants of Health (SDOH) Interventions SDOH Screenings   Food Insecurity: No Food Insecurity (05/30/2023)  Housing: Low Risk  (05/30/2023)  Transportation Needs: No Transportation Needs (05/30/2023)  Utilities: Not At Risk (05/30/2023)  Alcohol  Screen: Low Risk  (02/01/2020)  Depression (PHQ2-9): Medium Risk (04/22/2023)  Financial Resource Strain: Low Risk  (03/19/2022)  Physical Activity: Sufficiently Active (03/19/2022)  Social Connections: Socially Integrated (05/30/2023)  Stress: No Stress Concern Present (03/19/2022)  Tobacco Use: Medium Risk (05/30/2023)    Readmission Risk Interventions     No data to display

## 2023-06-12 NOTE — Progress Notes (Signed)
   06/12/23 2252  BiPAP/CPAP/SIPAP  BiPAP/CPAP/SIPAP Pt Type Adult  BiPAP/CPAP/SIPAP SERVO (NIVPC)  Mask Type Full face mask  Dentures removed? Yes - Placed in denture cup  Mask Size Small  Set Rate 15 breaths/min  Respiratory Rate 23 breaths/min  IPAP 14 cmH20  EPAP 6 cmH2O  PEEP 6 cmH20  FiO2 (%) 40 %  Minute Ventilation 10.9  Peak Inspiratory Pressure (PIP) 16  Tidal Volume (Vt) 453  Patient Home Machine No  Patient Home Mask No  Patient Home Tubing No  Auto Titrate No  Press High Alarm 25 cmH2O  Press Low Alarm 3 cmH2O  CPAP/SIPAP surface wiped down Yes  Device Plugged into RED Power Outlet Yes  Oxygen  Percent 40 %  BiPAP/CPAP /SiPAP Vitals  Pulse Rate (!) 52  Resp (!) 23  SpO2 98 %  Bilateral Breath Sounds Diminished;Clear  MEWS Score/Color  MEWS Score 1  MEWS Score Color Green

## 2023-06-13 DIAGNOSIS — J189 Pneumonia, unspecified organism: Secondary | ICD-10-CM | POA: Diagnosis not present

## 2023-06-13 DIAGNOSIS — A419 Sepsis, unspecified organism: Secondary | ICD-10-CM | POA: Diagnosis not present

## 2023-06-13 NOTE — Progress Notes (Signed)
 PROGRESS NOTE    Priscilla Cantrell  XBJ:[id number] DOB: 13-Apr-1935 DOA: 05/30/2023 PCP: Shelvia Dick, MD   Brief Narrative:  This 88 y.o. female with medical history significant for CKD stage II, depression, chronic hypoxic respiratory failure due to COPD on 2 L at baseline presented with worsening shortness of breath and cough and was admitted for sepsis due to community-acquired pneumonia along with acute on chronic hypoxic respiratory failure and COPD and CHF exacerbation.  She required BiPAP on presentation.  She has been on IV antibiotics, Solu-Medrol  and Lasix .  Cardiology following.  Patient continues to require high flow nasal cannula oxygen  during the day and BiPAP at night.  Pulmonary consulted as well.  Palliative care consulted for goals of care discussion.  Assessment & Plan:   Principal Problem:   Sepsis due to pneumonia [facility]) Active Problems:   Acute respiratory failure with hypoxia (HCC)   Acute diastolic CHF (congestive heart failure) (HCC)   Atrial fibrillation with RVR (HCC)   SVT (supraventricular tachycardia) (HCC)   COPD exacerbation (HCC)   Multifocal pneumonia   Infection due to parainfluenza virus 3   Viral pneumonia   Acute on chronic respiratory failure with hypoxia (HCC)   Pressure injury of skin   Goals of care, counseling/discussion   Palliative care encounter   Acute on chronic hypoxic respiratory failure: Sepsis secondary to community-acquired pneumonia: POA Possible COPD exacerbation: Acute on chronic diastolic CHF: -Respiratory status improving.  She was requiring up to 50L high flow nasal cannula oxygen  but this morning she is at 4-6 L-nasal cannula oxygen .  Still requiring BiPAP at night.  Completed a course of IV Levaquin  and ABX discontinued on 06/07/2023.  Stopped IV Solu-Medrol  to 40 mg every 12 hour, changed to prednisone  taper.  Continue current nebs.  Pulmonary following.  Respiratory panel PCR positive for parainfluenza virus 3 -Cardiology  following. Continue Diuretics as per cardiology.  Echo showed EF of 60 to 65% with grade 1 diastolic dysfunction.  Continue strict input and output.  Daily weight.  Fluid restriction.  - Palliative care following.  Patient remains full code. - Cardiology signed off.  Continue oral Lasix . - Pulmonology recommended patient will benefit from NIV ventilation.  Patient is at high risk for ending up with worsening symptoms, worsening respiratory failure ,recurrent ER visits, recurrent hospitalizations without NIV. - TOC notified to work on arranging [facility] before discharge.  Paroxysmal A-fib with RVR: -Currently heart rate is well-controlled.   Patient has been started on Eliquis  age appropriate dosing by cardiology.   Continue metoprolol  succinate and Cardizem .   Leukocytosis: - Now improving. Possibly from steroid use.   - Continue to monitor.   CKD stage II: - Creatinine stable.  Creatinine at baseline.   Hypokalemia: - Replaced.  Resolved.   Hyponatremia: - Mild.  Monitor.  Encourage oral intake.   Hypothyroidism: -Continue levothyroxine    Hypertension: -Monitor blood pressure.  Continue Cardizem , metoprolol  succinate.   Amlodipine  on hold currently   GERD: -Continue Protonix    Physical deconditioning: - PT recommending SNF placement.  Will consult TOC once respiratory status improves   Oral thrush: - Continue nystatin     DVT prophylaxis: Eliquis  Code Status: Full code Family Communication:  No family at bed side Disposition Plan:   Status is: Inpatient Remains inpatient appropriate because: Severity of illness.     Consultants:  Cardiology Pulmonology  Procedures: None  Antimicrobials:  Anti-infectives (From admission, onward)    Start     Dose/Rate Route Frequency Ordered  Stop   06/05/23 1800  levofloxacin  (LEVAQUIN ) IVPB 750 mg  Status:  Discontinued        750 mg 100 mL/hr over 90 Minutes Intravenous Every 48 hours 06/05/23 1142 06/07/23 1135    05/31/23 1730  levofloxacin  (LEVAQUIN ) IVPB 500 mg  Status:  Discontinued        500 mg 100 mL/hr over 60 Minutes Intravenous Every 24 hours 05/31/23 1631 06/05/23 1142   05/30/23 0900  doxycycline  (VIBRAMYCIN ) 100 mg in sodium chloride  0.9 % 250 mL IVPB  Status:  Discontinued        100 mg 125 mL/hr over 120 Minutes Intravenous Every 12 hours 05/30/23 0848 05/31/23 1631   05/30/23 0045  aztreonam  (AZACTAM ) 2 g in sodium chloride  0.9 % 100 mL IVPB        2 g 200 mL/hr over 30 Minutes Intravenous  Once 05/30/23 0037 05/30/23 0323   05/30/23 0045  metroNIDAZOLE  (FLAGYL ) IVPB 500 mg        500 mg 100 mL/hr over 60 Minutes Intravenous  Once 05/30/23 0037 05/30/23 0323   05/30/23 0045  vancomycin  (VANCOCIN ) IVPB 1000 mg/200 mL premix        1,000 mg 200 mL/hr over 60 Minutes Intravenous  Once 05/30/23 0037 05/30/23 0323      Subjective: Patient was seen and examined at bedside. Overnight events noted. Patient appears much improved she is alert and oriented. She still remains on 6 L of supplemental oxygen .   Objective: Vitals:   06/13/23 0511 06/13/23 0603 06/13/23 0840 06/13/23 1051  BP: 131/70  (!) 120/59 126/74  Pulse: 62  70 67  Resp: 20   18  Temp: 97.8 F (36.6 C)   98.1 F (36.7 C)  TempSrc: Oral   Oral  SpO2: 91% 100%  92%  Weight:      Height:        Intake/Output Summary (Last 24 hours) at 06/13/2023 1140 Last data filed at 06/13/2023 0533 Gross per 24 hour  Intake 360 ml  Output 1100 ml  Net -740 ml   Filed Weights   06/11/23 0500 06/12/23 0500 06/13/23 0500  Weight: 49.8 kg 47.7 kg 48.2 kg    Examination:  General exam: Appears calm and comfortable, deconditioned, not in any acute distress. Respiratory system: CTA Bilaterally . Respiratory effort normal.  RR 16 Cardiovascular system: S1 & S2 heard, RRR. No JVD, murmurs, rubs, gallops or clicks.  Gastrointestinal system: Abdomen is nondistended, soft and nontender.  Normal bowel sounds heard. Central nervous  system: Alert and oriented X 3. No focal neurological deficits. Extremities: No edema, no cyanosis, no clubbing. Skin: No rashes, lesions or ulcers Psychiatry: Judgement and insight appear normal. Mood & affect appropriate.     Data Reviewed: I have personally reviewed following labs and imaging studies  CBC: Recent Labs  Lab 06/07/23 0349 06/09/23 0640 06/10/23 0429 06/12/23 0411  WBC 17.0* 29.7* 19.2* 17.2*  NEUTROABS 15.9* 27.9* 18.1*  --   HGB 13.5 14.2 13.3 13.4  HCT 41.5 44.6 41.0 43.1  MCV 88.5 89.7 88.7 92.1  PLT 300 279 256 214   Basic Metabolic Panel: Recent Labs  Lab 06/07/23 0349 06/09/23 0640 06/10/23 0429 06/12/23 0411  NA 139 133* 132* 134*  K 3.4* 3.4* 4.0 4.4  CL 91* 88* 88* 92*  CO2 37* 33* 33* 31  GLUCOSE 209* 203* 216* 210*  BUN 38* 51* 56* 45*  CREATININE 0.76 0.87 0.94 0.76  CALCIUM  8.5* 8.7* 8.3* 8.3*  MG 2.3 2.6* 2.6* 2.5*  PHOS  --   --   --  3.8   GFR: Estimated Creatinine Clearance: 38.4 mL/min (by C-G formula based on SCr of 0.76 mg/dL). Liver Function Tests: No results for input(s): "AST", "ALT", "ALKPHOS", "BILITOT", "PROT", "ALBUMIN" in the last 168 hours.  No results for input(s): "LIPASE", "AMYLASE" in the last 168 hours. No results for input(s): "AMMONIA" in the last 168 hours. Coagulation Profile: No results for input(s): "INR", "PROTIME" in the last 168 hours. Cardiac Enzymes: No results for input(s): "CKTOTAL", "CKMB", "CKMBINDEX", "TROPONINI" in the last 168 hours. BNP (last 3 results) No results for input(s): "PROBNP" in the last 8760 hours. HbA1C: No results for input(s): "HGBA1C" in the last 72 hours. CBG: No results for input(s): "GLUCAP" in the last 168 hours. Lipid Profile: No results for input(s): "CHOL", "HDL", "LDLCALC", "TRIG", "CHOLHDL", "LDLDIRECT" in the last 72 hours. Thyroid  Function Tests: No results for input(s): "TSH", "T4TOTAL", "FREET4", "T3FREE", "THYROIDAB" in the last 72 hours. Anemia Panel: No  results for input(s): "VITAMINB12", "FOLATE", "FERRITIN", "TIBC", "IRON", "RETICCTPCT" in the last 72 hours. Sepsis Labs: No results for input(s): "PROCALCITON", "LATICACIDVEN" in the last 168 hours.  Recent Results (from the past 240 hours)  Respiratory (~20 pathogens) panel by PCR     Status: Abnormal   Collection Time: 06/04/23  1:50 PM   Specimen: Nasopharyngeal Swab; Respiratory  Result Value Ref Range Status   Adenovirus NOT DETECTED NOT DETECTED Final   Coronavirus 229E NOT DETECTED NOT DETECTED Final    Comment: (NOTE) The Coronavirus on the Respiratory Panel, DOES NOT test for the novel  Coronavirus (2019 nCoV)    Coronavirus HKU1 NOT DETECTED NOT DETECTED Final   Coronavirus NL63 NOT DETECTED NOT DETECTED Final   Coronavirus OC43 NOT DETECTED NOT DETECTED Final   Metapneumovirus NOT DETECTED NOT DETECTED Final   Rhinovirus / Enterovirus NOT DETECTED NOT DETECTED Final   Influenza A NOT DETECTED NOT DETECTED Final   Influenza B NOT DETECTED NOT DETECTED Final   Parainfluenza Virus 1 NOT DETECTED NOT DETECTED Final   Parainfluenza Virus 2 NOT DETECTED NOT DETECTED Final   Parainfluenza Virus 3 DETECTED (A) NOT DETECTED Final   Parainfluenza Virus 4 NOT DETECTED NOT DETECTED Final   Respiratory Syncytial Virus NOT DETECTED NOT DETECTED Final   Bordetella pertussis NOT DETECTED NOT DETECTED Final   Bordetella Parapertussis NOT DETECTED NOT DETECTED Final   Chlamydophila pneumoniae NOT DETECTED NOT DETECTED Final   Mycoplasma pneumoniae NOT DETECTED NOT DETECTED Final    Comment: Performed at [facility], [address], [place], [place] [address]    Radiology Studies: No results found. Scheduled Meds:  apixaban   2.5 mg Oral BID   arformoterol   15 mcg Nebulization BID   budesonide  (PULMICORT ) nebulizer solution  0.5 mg Nebulization BID   diltiazem   240 mg Oral Daily   feeding supplement  237 mL Oral BID BM   fluticasone   2 spray Each Nare Daily   furosemide   60  mg Oral Daily   guaiFENesin   1,200 mg Oral BID   levothyroxine   75 mcg Oral Q0600   metoprolol  succinate  25 mg Oral Daily   nystatin   5 mL Oral QID   pantoprazole   40 mg Oral BID   polyethylene glycol  17 g Oral Daily   potassium chloride   20 mEq Oral Daily   predniSONE   40 mg Oral Q breakfast   Followed by   Cecily Cohen ON 06/16/2023] predniSONE   30 mg Oral Q breakfast   Followed by   Cecily Cohen ON 06/20/2023] predniSONE   20 mg Oral Q breakfast   Followed by   Cecily Cohen ON 06/24/2023] predniSONE   10 mg Oral Q breakfast   revefenacin   175 mcg Nebulization Daily   senna-docusate  2 tablet Oral BID   venlafaxine   50 mg Oral BID   Continuous Infusions:   LOS: 14 days    Time spent: 35 mins    Magdalene School, MD [facility]   If 7PM-7AM, please contact night-coverage

## 2023-06-13 NOTE — Progress Notes (Signed)
   06/13/23 0144  BiPAP/CPAP/SIPAP  BiPAP/CPAP/SIPAP Pt Type Adult (Patient not tolerating BiPAP. RN placed back on 6L High FLow Nasal O2.)

## 2023-06-13 NOTE — Progress Notes (Signed)
   06/13/23 2126  BiPAP/CPAP/SIPAP  BiPAP/CPAP/SIPAP Pt Type Adult  BiPAP/CPAP/SIPAP SERVO (NIVPC)  Mask Type Full face mask  Dentures removed? Yes - Placed in denture cup  Mask Size Small  Set Rate 15 breaths/min  Respiratory Rate 29 breaths/min  IPAP 14 cmH20  EPAP 6 cmH2O  Pressure Support 8 cmH20  PEEP 6 cmH20  FiO2 (%) 40 %  Minute Ventilation 17.8  Peak Inspiratory Pressure (PIP) 16  Tidal Volume (Vt) 522  Patient Home Machine No  Patient Home Mask No  Patient Home Tubing No  Auto Titrate No  Press High Alarm 25 cmH2O  Press Low Alarm 3 cmH2O  CPAP/SIPAP surface wiped down Yes  Device Plugged into RED Power Outlet Yes  Oxygen  Percent 40 %  BiPAP/CPAP /SiPAP Vitals  Pulse Rate 77  Resp (!) 29  SpO2 94 %  Bilateral Breath Sounds Clear;Diminished  MEWS Score/Color  MEWS Score 3  MEWS Score Color Yellow

## 2023-06-13 NOTE — Progress Notes (Signed)
 Mobility Specialist - Progress Note   06/13/23 1030  Mobility  Activity Transferred to/from [facility]  Level of Assistance Minimal assist, patient does 75% or more  Assistive Device Front wheel walker  Range of Motion/Exercises Active Assistive  Activity Response Tolerated well  Mobility Referral Yes  Mobility visit 1 Mobility  Mobility Specialist Start Time (ACUTE ONLY) 1030  Mobility Specialist Stop Time (ACUTE ONLY) 1045  Mobility Specialist Time Calculation (min) (ACUTE ONLY) 15 min   Pt was found in bed and agreeable to ambulate. Stated needing to use BSC prior to ambulation. Assisted with transfer and afterwards stated feeling too fatigued to ambulate. Pt returned to bed with all needs met. Call bell in reach and NT notified.  Lorna Rose Mobility Specialist

## 2023-06-14 DIAGNOSIS — A419 Sepsis, unspecified organism: Secondary | ICD-10-CM | POA: Diagnosis not present

## 2023-06-14 DIAGNOSIS — J189 Pneumonia, unspecified organism: Secondary | ICD-10-CM | POA: Diagnosis not present

## 2023-06-14 NOTE — Progress Notes (Signed)
 PROGRESS NOTE    Priscilla Cantrell  YQI:[id number] DOB: 08-20-1935 DOA: 05/30/2023 PCP: Shelvia Dick, MD   Brief Narrative:  This 88 y.o. female with medical history significant for CKD stage II, depression, chronic hypoxic respiratory failure due to COPD on 2 L at baseline presented with worsening shortness of breath and cough and was admitted for sepsis due to community-acquired pneumonia along with acute on chronic hypoxic respiratory failure and COPD and CHF exacerbation.  She required BiPAP on presentation.  She has been on IV antibiotics, Solu-Medrol  and Lasix .  Cardiology following.  Patient continues to require high flow nasal cannula oxygen  during the day and BiPAP at night.  Pulmonary consulted as well.  Palliative care consulted for goals of care discussion.  Assessment & Plan:   Principal Problem:   Sepsis due to pneumonia [facility]) Active Problems:   Acute respiratory failure with hypoxia (HCC)   Acute diastolic CHF (congestive heart failure) (HCC)   Atrial fibrillation with RVR (HCC)   SVT (supraventricular tachycardia) (HCC)   COPD exacerbation (HCC)   Multifocal pneumonia   Infection due to parainfluenza virus 3   Viral pneumonia   Acute on chronic respiratory failure with hypoxia (HCC)   Pressure injury of skin   Goals of care, counseling/discussion   Palliative care encounter   Acute on chronic hypoxic respiratory failure: Sepsis secondary to community-acquired pneumonia: POA Possible COPD exacerbation: Acute on chronic diastolic CHF: -Respiratory status improving.  She was requiring up to 50L high flow nasal cannula oxygen  but this morning she is at 4-6 L-nasal cannula oxygen .  Still requiring BiPAP at night.  Completed a course of IV Levaquin  and ABX discontinued on 06/07/2023.  Stopped IV Solu-Medrol  to 40 mg every 12 hour, changed to prednisone  taper.  Continue current nebs.  Pulmonary following.  Respiratory panel PCR positive for parainfluenza virus 3 -Cardiology  following. Continue Diuretics as per cardiology.  Echo showed EF of 60 to 65% with grade 1 diastolic dysfunction.  Continue strict input and output.  Daily weight.  Fluid restriction.  - Palliative care following.  Patient remains full code. - Cardiology signed off.  Continue oral Lasix . - Pulmonology recommended patient will benefit from NIV ventilation. Patient is at high risk for ending up with worsening symptoms, worsening respiratory failure ,recurrent ER visits, recurrent hospitalizations without NIV. - TOC notified to work on arranging trilogy before discharge.  - Patient needs BIPAP, has to qualify , ABG pCO2 of 52 or higher.  Paroxysmal A-fib with RVR: -Currently heart rate is well-controlled.   Patient has been started on Eliquis  age appropriate dosing by cardiology.   Continue metoprolol  succinate and Cardizem .   Leukocytosis: - Now improving. Possibly from steroid use.   - Continue to monitor.   CKD stage II: - Creatinine stable.  Creatinine at baseline.   Hypokalemia: - Replaced.  Resolved.   Hyponatremia: - Mild.  Monitor.  Encourage oral intake.   Hypothyroidism: -Continue levothyroxine .   Hypertension: -Monitor blood pressure.  Continue Cardizem , metoprolol  succinate.   Amlodipine  on hold currently   GERD: -Continue Protonix    Physical deconditioning: - PT recommending SNF placement.  Will consult TOC once respiratory status improves   Oral thrush: - Continue nystatin     DVT prophylaxis: Eliquis  Code Status: Full code Family Communication:  No family at bed side Disposition Plan:   Status is: Inpatient Remains inpatient appropriate because: Severity of illness.   TOC notified to work on arranging trilogy before discharge.  - Patient needs BIPAP,  has to qualify , ABG pCO2 of 52 or higher.   Consultants:  Cardiology Pulmonology  Procedures: None  Antimicrobials:  Anti-infectives (From admission, onward)    Start     Dose/Rate Route  Frequency Ordered Stop   06/05/23 1800  levofloxacin  (LEVAQUIN ) IVPB 750 mg  Status:  Discontinued        750 mg 100 mL/hr over 90 Minutes Intravenous Every 48 hours 06/05/23 1142 06/07/23 1135   05/31/23 1730  levofloxacin  (LEVAQUIN ) IVPB 500 mg  Status:  Discontinued        500 mg 100 mL/hr over 60 Minutes Intravenous Every 24 hours 05/31/23 1631 06/05/23 1142   05/30/23 0900  doxycycline  (VIBRAMYCIN ) 100 mg in sodium chloride  0.9 % 250 mL IVPB  Status:  Discontinued        100 mg 125 mL/hr over 120 Minutes Intravenous Every 12 hours 05/30/23 0848 05/31/23 1631   05/30/23 0045  aztreonam  (AZACTAM ) 2 g in sodium chloride  0.9 % 100 mL IVPB        2 g 200 mL/hr over 30 Minutes Intravenous  Once 05/30/23 0037 05/30/23 0323   05/30/23 0045  metroNIDAZOLE  (FLAGYL ) IVPB 500 mg        500 mg 100 mL/hr over 60 Minutes Intravenous  Once 05/30/23 0037 05/30/23 0323   05/30/23 0045  vancomycin  (VANCOCIN ) IVPB 1000 mg/200 mL premix        1,000 mg 200 mL/hr over 60 Minutes Intravenous  Once 05/30/23 0037 05/30/23 0323      Subjective: Patient was seen and examined at bedside. Overnight events noted. Patient appears much improved.  She is alert and oriented and she stays in the chair. She still remains on 6 L of supplemental oxygen .   Objective: Vitals:   06/13/23 2200 06/14/23 0500 06/14/23 0503 06/14/23 1015  BP: (!) 143/64  126/67   Pulse: (!) 57  (!) 58   Resp: 20  19   Temp: (!) 97.4 F (36.3 C)  97.9 F (36.6 C)   TempSrc: Oral  Oral   SpO2: 99%  96% 95%  Weight:  48 kg    Height:        Intake/Output Summary (Last 24 hours) at 06/14/2023 1124 Last data filed at 06/14/2023 0900 Gross per 24 hour  Intake 700 ml  Output --  Net 700 ml   Filed Weights   06/12/23 0500 06/13/23 0500 06/14/23 0500  Weight: 47.7 kg 48.2 kg 48 kg    Examination:  General exam: Appears calm and comfortable, deconditioned, not in any acute distress. Respiratory system: CTA Bilaterally .  Respiratory effort normal.  RR 14 Cardiovascular system: S1 & S2 heard, RRR. No JVD, murmurs, rubs, gallops or clicks.  Gastrointestinal system: Abdomen is nondistended, soft and nontender.  Normal bowel sounds heard. Central nervous system: Alert and oriented X 3. No focal neurological deficits. Extremities: No edema, no cyanosis, no clubbing. Skin: No rashes, lesions or ulcers Psychiatry: Judgement and insight appear normal. Mood & affect appropriate.    Data Reviewed: I have personally reviewed following labs and imaging studies  CBC: Recent Labs  Lab 06/09/23 0640 06/10/23 0429 06/12/23 0411  WBC 29.7* 19.2* 17.2*  NEUTROABS 27.9* 18.1*  --   HGB 14.2 13.3 13.4  HCT 44.6 41.0 43.1  MCV 89.7 88.7 92.1  PLT 279 256 214   Basic Metabolic Panel: Recent Labs  Lab 06/09/23 0640 06/10/23 0429 06/12/23 0411  NA 133* 132* 134*  K 3.4* 4.0 4.4  CL  88* 88* 92*  CO2 33* 33* 31  GLUCOSE 203* 216* 210*  BUN 51* 56* 45*  CREATININE 0.87 0.94 0.76  CALCIUM  8.7* 8.3* 8.3*  MG 2.6* 2.6* 2.5*  PHOS  --   --  3.8   GFR: Estimated Creatinine Clearance: 38.3 mL/min (by C-G formula based on SCr of 0.76 mg/dL). Liver Function Tests: No results for input(s): "AST", "ALT", "ALKPHOS", "BILITOT", "PROT", "ALBUMIN" in the last 168 hours.  No results for input(s): "LIPASE", "AMYLASE" in the last 168 hours. No results for input(s): "AMMONIA" in the last 168 hours. Coagulation Profile: No results for input(s): "INR", "PROTIME" in the last 168 hours. Cardiac Enzymes: No results for input(s): "CKTOTAL", "CKMB", "CKMBINDEX", "TROPONINI" in the last 168 hours. BNP (last 3 results) No results for input(s): "PROBNP" in the last 8760 hours. HbA1C: No results for input(s): "HGBA1C" in the last 72 hours. CBG: No results for input(s): "GLUCAP" in the last 168 hours. Lipid Profile: No results for input(s): "CHOL", "HDL", "LDLCALC", "TRIG", "CHOLHDL", "LDLDIRECT" in the last 72 hours. Thyroid   Function Tests: No results for input(s): "TSH", "T4TOTAL", "FREET4", "T3FREE", "THYROIDAB" in the last 72 hours. Anemia Panel: No results for input(s): "VITAMINB12", "FOLATE", "FERRITIN", "TIBC", "IRON", "RETICCTPCT" in the last 72 hours. Sepsis Labs: No results for input(s): "PROCALCITON", "LATICACIDVEN" in the last 168 hours.  Recent Results (from the past 240 hours)  Respiratory (~20 pathogens) panel by PCR     Status: Abnormal   Collection Time: 06/04/23  1:50 PM   Specimen: Nasopharyngeal Swab; Respiratory  Result Value Ref Range Status   Adenovirus NOT DETECTED NOT DETECTED Final   Coronavirus 229E NOT DETECTED NOT DETECTED Final    Comment: (NOTE) The Coronavirus on the Respiratory Panel, DOES NOT test for the novel  Coronavirus (2019 nCoV)    Coronavirus HKU1 NOT DETECTED NOT DETECTED Final   Coronavirus NL63 NOT DETECTED NOT DETECTED Final   Coronavirus OC43 NOT DETECTED NOT DETECTED Final   Metapneumovirus NOT DETECTED NOT DETECTED Final   Rhinovirus / Enterovirus NOT DETECTED NOT DETECTED Final   Influenza A NOT DETECTED NOT DETECTED Final   Influenza B NOT DETECTED NOT DETECTED Final   Parainfluenza Virus 1 NOT DETECTED NOT DETECTED Final   Parainfluenza Virus 2 NOT DETECTED NOT DETECTED Final   Parainfluenza Virus 3 DETECTED (A) NOT DETECTED Final   Parainfluenza Virus 4 NOT DETECTED NOT DETECTED Final   Respiratory Syncytial Virus NOT DETECTED NOT DETECTED Final   Bordetella pertussis NOT DETECTED NOT DETECTED Final   Bordetella Parapertussis NOT DETECTED NOT DETECTED Final   Chlamydophila pneumoniae NOT DETECTED NOT DETECTED Final   Mycoplasma pneumoniae NOT DETECTED NOT DETECTED Final    Comment: Performed at [facility], [address] [place], [place] [address]    Radiology Studies: No results found. Scheduled Meds:  apixaban   2.5 mg Oral BID   arformoterol   15 mcg Nebulization BID   budesonide  (PULMICORT ) nebulizer solution  0.5 mg Nebulization BID    diltiazem   240 mg Oral Daily   feeding supplement  237 mL Oral BID BM   fluticasone   2 spray Each Nare Daily   furosemide   60 mg Oral Daily   guaiFENesin   1,200 mg Oral BID   levothyroxine   75 mcg Oral Q0600   metoprolol  succinate  25 mg Oral Daily   nystatin   5 mL Oral QID   pantoprazole   40 mg Oral BID   polyethylene glycol  17 g Oral Daily   potassium chloride   20 mEq Oral Daily   predniSONE   40 mg Oral Q breakfast   Followed by   Cecily Cohen ON 06/16/2023] predniSONE   30 mg Oral Q breakfast   Followed by   Cecily Cohen ON 06/20/2023] predniSONE   20 mg Oral Q breakfast   Followed by   Cecily Cohen ON 06/24/2023] predniSONE   10 mg Oral Q breakfast   revefenacin   175 mcg Nebulization Daily   senna-docusate  2 tablet Oral BID   venlafaxine   50 mg Oral BID   Continuous Infusions:   LOS: 15 days    Time spent: 35 mins    Magdalene School, MD [facility]   If 7PM-7AM, please contact night-coverage

## 2023-06-14 NOTE — Progress Notes (Signed)
 Patient removed BiPAP. She could no longer tolerate it. Pt is back on 5L High Flow Nasal O2 SpO2 95%.

## 2023-06-15 DIAGNOSIS — J189 Pneumonia, unspecified organism: Secondary | ICD-10-CM | POA: Diagnosis not present

## 2023-06-15 DIAGNOSIS — A419 Sepsis, unspecified organism: Secondary | ICD-10-CM | POA: Diagnosis not present

## 2023-06-15 LAB — BASIC METABOLIC PANEL WITH GFR
Anion gap: 9 (ref 5–15)
BUN: 31 mg/dL — ABNORMAL HIGH (ref 8–23)
CO2: 32 mmol/L (ref 22–32)
Calcium: 8.2 mg/dL — ABNORMAL LOW (ref 8.9–10.3)
Chloride: 93 mmol/L — ABNORMAL LOW (ref 98–111)
Creatinine, Ser: 0.91 mg/dL (ref 0.44–1.00)
GFR, Estimated: >60 mL/min (ref >60)
Glucose, Bld: 142 mg/dL — ABNORMAL HIGH (ref 70–99)
Potassium: 3.6 mmol/L (ref 3.5–5.1)
Sodium: 134 mmol/L — ABNORMAL LOW (ref 135–145)

## 2023-06-15 LAB — CBC
HCT: 40.1 % (ref 36.0–46.0)
Hemoglobin: 12.5 g/dL (ref 12.0–15.0)
MCH: 28.5 pg (ref 26.0–34.0)
MCHC: 31.2 g/dL (ref 30.0–36.0)
MCV: 91.3 fL (ref 80.0–100.0)
Platelets: 238 10*3/uL (ref 150–400)
RBC: 4.39 MIL/uL (ref 3.87–5.11)
RDW: 13.1 % (ref 11.5–15.5)
WBC: 18.1 10*3/uL — ABNORMAL HIGH (ref 4.0–10.5)
nRBC: 0 % (ref 0.0–0.2)

## 2023-06-15 LAB — PHOSPHORUS: Phosphorus: 2.5 mg/dL (ref 2.5–4.6)

## 2023-06-15 LAB — MAGNESIUM: Magnesium: 2.3 mg/dL (ref 1.7–2.4)

## 2023-06-15 NOTE — Progress Notes (Signed)
 PROGRESS NOTE    Priscilla Cantrell  ZOX:[id number] DOB: 1935-12-21 DOA: 05/30/2023 PCP: Shelvia Dick, MD   Brief Narrative:  This 88 y.o. female with medical history significant for CKD stage II, depression, chronic hypoxic respiratory failure due to COPD on 2 L at baseline presented with worsening shortness of breath and cough and was admitted for sepsis due to community-acquired pneumonia along with acute on chronic hypoxic respiratory failure and COPD and CHF exacerbation.  She required BiPAP on presentation.  She has been on IV antibiotics, Solu-Medrol  and Lasix .  Cardiology following.  Patient continues to require high flow nasal cannula oxygen  during the day and BiPAP at night.  Pulmonary consulted as well.  Palliative care consulted for goals of care discussion.  Assessment & Plan:   Principal Problem:   Sepsis due to pneumonia [facility]) Active Problems:   Acute respiratory failure with hypoxia (HCC)   Acute diastolic CHF (congestive heart failure) (HCC)   Atrial fibrillation with RVR (HCC)   SVT (supraventricular tachycardia) (HCC)   COPD exacerbation (HCC)   Multifocal pneumonia   Infection due to parainfluenza virus 3   Viral pneumonia   Acute on chronic respiratory failure with hypoxia (HCC)   Pressure injury of skin   Goals of care, counseling/discussion   Palliative care encounter   Acute on chronic hypoxic respiratory failure: Sepsis secondary to community-acquired pneumonia: POA Possible COPD exacerbation: Acute on chronic diastolic CHF: -Respiratory status improving.  She was requiring up to 50L high flow nasal cannula oxygen  but now she is at 4-6 L-nasal cannula oxygen .  Still requiring BiPAP at night.  Completed a course of IV Levaquin  and ABX discontinued on 06/07/2023.  Stopped IV Solu-Medrol  to 40 mg every 12 hour, changed to prednisone  taper.  Continue current nebs.  Pulmonary following.  Respiratory panel PCR positive for parainfluenza virus 3 -Cardiology  following. Continue Diuretics as per cardiology.  Echo showed EF of 60 to 65% with grade 1 diastolic dysfunction.  Continue strict input and output.  Daily weight.  Fluid restriction.  - Palliative care following.  Patient remains full code. - Cardiology signed off.  Continue oral Lasix . - Pulmonology recommended patient will benefit from NIV ventilation. Patient is at high risk for ending up with worsening symptoms, worsening respiratory failure ,recurrent ER visits, recurrent hospitalizations without NIV. - TOC notified to work on arranging trilogy before discharge.  - Patient needs BIPAP, has to qualify , ABG pCO2 of 52 or higher.  Paroxysmal A-fib with RVR: -Currently heart rate is well-controlled.   Patient has been started on Eliquis  age appropriate dosing by cardiology.   Continue metoprolol  succinate and Cardizem .   Leukocytosis: - Now improving. Possibly from steroid use.   - Continue to monitor.   CKD stage II: - Creatinine stable.  Creatinine at baseline.   Hypokalemia: - Replaced.  Resolved.   Hyponatremia: - Mild.  Monitor.  Encourage oral intake.   Hypothyroidism: -Continue levothyroxine .   Hypertension: -Monitor blood pressure.  Continue Cardizem , metoprolol  succinate.   Amlodipine  on hold currently   GERD: -Continue Protonix    Physical deconditioning: - PT recommending SNF placement.  Will consult TOC once respiratory status improves   Oral thrush: - Continue nystatin     DVT prophylaxis: Eliquis  Code Status: Full code Family Communication:  No family at bed side Disposition Plan:   Status is: Inpatient Remains inpatient appropriate because: Severity of illness.   TOC notified to work on arranging trilogy before discharge.  - Patient needs BIPAP, has  to qualify , ABG pCO2 of 52 or higher.   Consultants:  Cardiology Pulmonology  Procedures: None  Antimicrobials:  Anti-infectives (From admission, onward)    Start     Dose/Rate Route  Frequency Ordered Stop   06/05/23 1800  levofloxacin  (LEVAQUIN ) IVPB 750 mg  Status:  Discontinued        750 mg 100 mL/hr over 90 Minutes Intravenous Every 48 hours 06/05/23 1142 06/07/23 1135   05/31/23 1730  levofloxacin  (LEVAQUIN ) IVPB 500 mg  Status:  Discontinued        500 mg 100 mL/hr over 60 Minutes Intravenous Every 24 hours 05/31/23 1631 06/05/23 1142   05/30/23 0900  doxycycline  (VIBRAMYCIN ) 100 mg in sodium chloride  0.9 % 250 mL IVPB  Status:  Discontinued        100 mg 125 mL/hr over 120 Minutes Intravenous Every 12 hours 05/30/23 0848 05/31/23 1631   05/30/23 0045  aztreonam  (AZACTAM ) 2 g in sodium chloride  0.9 % 100 mL IVPB        2 g 200 mL/hr over 30 Minutes Intravenous  Once 05/30/23 0037 05/30/23 0323   05/30/23 0045  metroNIDAZOLE  (FLAGYL ) IVPB 500 mg        500 mg 100 mL/hr over 60 Minutes Intravenous  Once 05/30/23 0037 05/30/23 0323   05/30/23 0045  vancomycin  (VANCOCIN ) IVPB 1000 mg/200 mL premix        1,000 mg 200 mL/hr over 60 Minutes Intravenous  Once 05/30/23 0037 05/30/23 0323      Subjective: Patient was seen and examined at bedside. Overnight events noted. Patient appears much improved.  She is alert and oriented , She is sitting in the chair,  having nebulization treatment. Weaned down to 3 L of supplemental oxygen .  Objective: Vitals:   06/15/23 0347 06/15/23 0500 06/15/23 0847 06/15/23 1014  BP: (!) 112/59   (!) 99/51  Pulse: 66   67  Resp: 16     Temp: 98.4 F (36.9 C)     TempSrc: Oral     SpO2: 99%  97%   Weight:  48.5 kg    Height:        Intake/Output Summary (Last 24 hours) at 06/15/2023 1107 Last data filed at 06/15/2023 1610 Gross per 24 hour  Intake 300 ml  Output --  Net 300 ml   Filed Weights   06/13/23 0500 06/14/23 0500 06/15/23 0500  Weight: 48.2 kg 48 kg 48.5 kg    Examination:  General exam: Appears calm and comfortable, deconditioned, not in any acute distress. Respiratory system: CTA Bilaterally . Respiratory  effort normal.  RR 15 Cardiovascular system: S1 & S2 heard, RRR. No JVD, murmurs, rubs, gallops or clicks.  Gastrointestinal system: Abdomen is nondistended, soft and nontender.  Normal bowel sounds heard. Central nervous system: Alert and oriented X 3. No focal neurological deficits. Extremities: No edema, no cyanosis, no clubbing. Skin: No rashes, lesions or ulcers Psychiatry: Judgement and insight appear normal. Mood & affect appropriate.    Data Reviewed: I have personally reviewed following labs and imaging studies  CBC: Recent Labs  Lab 06/09/23 0640 06/10/23 0429 06/12/23 0411 06/15/23 0348  WBC 29.7* 19.2* 17.2* 18.1*  NEUTROABS 27.9* 18.1*  --   --   HGB 14.2 13.3 13.4 12.5  HCT 44.6 41.0 43.1 40.1  MCV 89.7 88.7 92.1 91.3  PLT 279 256 214 238   Basic Metabolic Panel: Recent Labs  Lab 06/09/23 0640 06/10/23 0429 06/12/23 0411 06/15/23 0348  NA 133*  132* 134* 134*  K 3.4* 4.0 4.4 3.6  CL 88* 88* 92* 93*  CO2 33* 33* 31 32  GLUCOSE 203* 216* 210* 142*  BUN 51* 56* 45* 31*  CREATININE 0.87 0.94 0.76 0.91  CALCIUM  8.7* 8.3* 8.3* 8.2*  MG 2.6* 2.6* 2.5* 2.3  PHOS  --   --  3.8 2.5   GFR: Estimated Creatinine Clearance: 34 mL/min (by C-G formula based on SCr of 0.91 mg/dL). Liver Function Tests: No results for input(s): "AST", "ALT", "ALKPHOS", "BILITOT", "PROT", "ALBUMIN" in the last 168 hours.  No results for input(s): "LIPASE", "AMYLASE" in the last 168 hours. No results for input(s): "AMMONIA" in the last 168 hours. Coagulation Profile: No results for input(s): "INR", "PROTIME" in the last 168 hours. Cardiac Enzymes: No results for input(s): "CKTOTAL", "CKMB", "CKMBINDEX", "TROPONINI" in the last 168 hours. BNP (last 3 results) No results for input(s): "PROBNP" in the last 8760 hours. HbA1C: No results for input(s): "HGBA1C" in the last 72 hours. CBG: No results for input(s): "GLUCAP" in the last 168 hours. Lipid Profile: No results for input(s):  "CHOL", "HDL", "LDLCALC", "TRIG", "CHOLHDL", "LDLDIRECT" in the last 72 hours. Thyroid  Function Tests: No results for input(s): "TSH", "T4TOTAL", "FREET4", "T3FREE", "THYROIDAB" in the last 72 hours. Anemia Panel: No results for input(s): "VITAMINB12", "FOLATE", "FERRITIN", "TIBC", "IRON", "RETICCTPCT" in the last 72 hours. Sepsis Labs: No results for input(s): "PROCALCITON", "LATICACIDVEN" in the last 168 hours.  No results found for this or any previous visit (from the past 240 hours).   Radiology Studies: No results found. Scheduled Meds:  apixaban   2.5 mg Oral BID   arformoterol   15 mcg Nebulization BID   budesonide  (PULMICORT ) nebulizer solution  0.5 mg Nebulization BID   diltiazem   240 mg Oral Daily   feeding supplement  237 mL Oral BID BM   fluticasone   2 spray Each Nare Daily   furosemide   60 mg Oral Daily   guaiFENesin   1,200 mg Oral BID   levothyroxine   75 mcg Oral Q0600   metoprolol  succinate  25 mg Oral Daily   nystatin   5 mL Oral QID   pantoprazole   40 mg Oral BID   polyethylene glycol  17 g Oral Daily   potassium chloride   20 mEq Oral Daily   [START ON 06/16/2023] predniSONE   30 mg Oral Q breakfast   Followed by   Cecily Cohen ON 06/20/2023] predniSONE   20 mg Oral Q breakfast   Followed by   Cecily Cohen ON 06/24/2023] predniSONE   10 mg Oral Q breakfast   revefenacin   175 mcg Nebulization Daily   senna-docusate  2 tablet Oral BID   venlafaxine   50 mg Oral BID   Continuous Infusions:   LOS: 16 days    Time spent: 35 mins    Magdalene School, MD [facility]   If 7PM-7AM, please contact night-coverage

## 2023-06-15 NOTE — Progress Notes (Addendum)
 Patient reports discomfort from BiPAP. Adjusted BiPAP to pt's liking. After about an hour, patient adamantly requests to remove BiPAP completely. Pt is back on 5L HFNC SpO2 94-100%.

## 2023-06-15 NOTE — TOC Progression Note (Signed)
 Transition of Care [facility]) - Progression Note    Patient Details  Name: Priscilla Cantrell MRN: [id number] Date of Birth: 02/03/1936  Transition of [facility]) CM/SW Contact  Gertha Ku, LCSW Phone Number: 06/15/2023, 10:41 AM  Clinical Narrative:    Pt's insurance auth for [employer] [facility]  was resubmitted and is pending. TOC to follow.    Expected Discharge Plan: Skilled Nursing Facility Barriers to Discharge: English as a second language teacher, ED DME delivery  Expected Discharge Plan and Services       Living arrangements for the past 2 months: Single Family Home                                       Social Determinants of Health (SDOH) Interventions SDOH Screenings   Food Insecurity: No Food Insecurity (05/30/2023)  Housing: Low Risk  (05/30/2023)  Transportation Needs: No Transportation Needs (05/30/2023)  Utilities: Not At Risk (05/30/2023)  Alcohol  Screen: Low Risk  (02/01/2020)  Depression (PHQ2-9): Medium Risk (04/22/2023)  Financial Resource Strain: Low Risk  (03/19/2022)  Physical Activity: Sufficiently Active (03/19/2022)  Social Connections: Socially Integrated (05/30/2023)  Stress: No Stress Concern Present (03/19/2022)  Tobacco Use: Medium Risk (05/30/2023)    Readmission Risk Interventions     No data to display

## 2023-06-15 NOTE — Progress Notes (Signed)
 Physical Therapy Treatment Patient Details Name: Priscilla Cantrell MRN: [id number] DOB: 02-Jun-1935 Today's Date: 06/15/2023   History of Present Illness Patient is a 88 year old female who presented on 4/26 with SOB. Patient was admitted with sepsis, a fib with RVR, acute on chronic hypoxic respiratory failure, COPD exacerbation, acute on chronic diastolic CHF and CAP. Pt required HHFNC.  Pt's respiratory status improving but currently on 6L however requiring BiPAP at night. 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    PT Comments  Patient resting in recliner and start of session and agreeable to participate in therapy visit. Spouse present and provided chair follow during gait. Pt demonstrates good recall for safe transfer technique with sit<>stand to RW, CGA for safety. Pt tolerated increased gait distance with 2 standing rest breaks to amb ~90', CGA for safety, assist for O2 lines management. pt on 3L/min; SpO2 dropping to 70's however suspect from use of RW as pt recovered to 93% with standing break. EOS reviewed posterior hip precautions, pt recalled 2/3. Reviewed exercises for circulation, strengthening, and breathing. Will continue to progress pt as able during acute stay.     If plan is discharge home, recommend the following: A little help with walking and/or transfers;A little help with bathing/dressing/bathroom;Assistance with cooking/housework;Assist for transportation;Help with stairs or ramp for entrance   Can travel by private vehicle     Yes  Equipment Recommendations  None recommended by PT    Recommendations for Other Services       Precautions / Restrictions Precautions Precautions: Fall;Posterior Hip Precaution Booklet Issued: No Recall of Precautions/Restrictions: Impaired Precaution/Restrictions Comments: Pt able to recall 2/3 Post. Hip Precautions.  Reviewed hip precautions Restrictions Weight Bearing Restrictions Per Provider Order: No Other Position/Activity Restrictions: Right Posterior hip Revision after dislocation on 02/10/23     Mobility  Bed Mobility               General bed mobility comments: pt in recliner    Transfers Overall transfer level: Needs assistance Equipment used: Rolling walker (2 wheels) Transfers: Sit to/from Stand Sit to Stand: Contact guard assist           General transfer comment: pt with good recall for hand placement, CGA for safety with power up. no reach back to sit when prompted.    Ambulation/Gait Ambulation/Gait assistance: Contact guard assist Gait Distance (Feet): 90 Feet Assistive device: Rolling walker (2 wheels) Gait Pattern/deviations: Decreased stride length, Step-through pattern Gait velocity: decr     General Gait Details: pt on 3L/min. SpO2 dropping to 70's however suspect from use of RW, recovered to 93% with standign break. Suspect pt may drop into high 80's but recovers quickly on 3L/min. Pt maintained safe proximity to RW throughout. No overt LOB noted, chair follow for safety.   Stairs             Wheelchair Mobility     Tilt Bed    Modified Rankin (Stroke Patients Only)       Balance Overall balance assessment: Needs assistance Sitting-balance support: No upper extremity supported Sitting balance-Leahy Scale: Good     Standing balance support: Bilateral upper extremity supported, During functional activity, Reliant on assistive device for balance Standing balance-Leahy Scale: Poor                              Communication Communication Communication:  No apparent difficulties  Cognition Arousal: Alert Behavior During Therapy: WFL for tasks assessed/performed   PT - Cognitive impairments: Problem solving, Safety/Judgement                       PT - Cognition Comments: still not able to to recall all her posterior  hip precautions Following commands: Intact      Cueing Cueing Techniques: Verbal cues  Exercises General Exercises - Lower Extremity Ankle Circles/Pumps: AROM, Both, 20 reps, Seated Long Arc Quad: AROM, Both, 20 reps, Seated Other Exercises Other Exercises: 2x5 reps flutter valve    General Comments        Pertinent Vitals/Pain Pain Assessment Pain Assessment: No/denies pain Pain Intervention(s): Monitored during session    Home Living                          Prior Function            PT Goals (current goals can now be found in the care plan section) Acute Rehab PT Goals Patient Stated Goal: likes to walk PT Goal Formulation: With patient Time For Goal Achievement: 06/17/23 Potential to Achieve Goals: Good Progress towards PT goals: Progressing toward goals    Frequency    Min 3X/week      PT Plan      Co-evaluation              AM-PAC PT "6 Clicks" Mobility   Outcome Measure  Help needed turning from your back to your side while in a flat bed without using bedrails?: A Little Help needed moving from lying on your back to sitting on the side of a flat bed without using bedrails?: A Little Help needed moving to and from a bed to a chair (including a wheelchair)?: A Little Help needed standing up from a chair using your arms (e.g., wheelchair or bedside chair)?: A Little Help needed to walk in hospital room?: A Little Help needed climbing 3-5 steps with a railing? : A Lot 6 Click Score: 17    End of Session Equipment Utilized During Treatment: Gait belt;Oxygen  Activity Tolerance: Patient tolerated treatment well Patient left: in chair;with call bell/phone within reach;with family/visitor present Nurse Communication: Mobility status PT Visit Diagnosis: Difficulty in walking, not elsewhere classified (R26.2);History of falling (Z91.81)     Time: 1610-9604 PT Time Calculation (min) (ACUTE ONLY): 23 min  Charges:    $Gait Training:  8-22 mins $Therapeutic Exercise: 8-22 mins PT General Charges $$ ACUTE PT VISIT: 1 Visit                     Tish Forge, DPT Acute Rehabilitation Services Office [phone]  06/15/23 12:11 PM

## 2023-06-16 LAB — BASIC METABOLIC PANEL WITH GFR
Anion gap: 9 (ref 5–15)
BUN: 29 mg/dL — ABNORMAL HIGH (ref 8–23)
CO2: 31 mmol/L (ref 22–32)
Calcium: 8.2 mg/dL — ABNORMAL LOW (ref 8.9–10.3)
Chloride: 95 mmol/L — ABNORMAL LOW (ref 98–111)
Creatinine, Ser: 0.58 mg/dL (ref 0.44–1.00)
GFR, Estimated: >60 mL/min (ref >60)
Glucose, Bld: 110 mg/dL — ABNORMAL HIGH (ref 70–99)
Potassium: 3.5 mmol/L (ref 3.5–5.1)
Sodium: 135 mmol/L (ref 135–145)

## 2023-06-16 LAB — CBC
HCT: 38.3 % (ref 36.0–46.0)
Hemoglobin: 12.3 g/dL (ref 12.0–15.0)
MCH: 28.4 pg (ref 26.0–34.0)
MCHC: 32.1 g/dL (ref 30.0–36.0)
MCV: 88.5 fL (ref 80.0–100.0)
Platelets: 259 10*3/uL (ref 150–400)
RBC: 4.33 MIL/uL (ref 3.87–5.11)
RDW: 12.8 % (ref 11.5–15.5)
WBC: 16.3 10*3/uL — ABNORMAL HIGH (ref 4.0–10.5)
nRBC: 0 % (ref 0.0–0.2)

## 2023-06-16 LAB — PHOSPHORUS: Phosphorus: 3 mg/dL (ref 2.5–4.6)

## 2023-06-16 LAB — MAGNESIUM: Magnesium: 2.2 mg/dL (ref 1.7–2.4)

## 2023-06-16 NOTE — Plan of Care (Signed)

## 2023-06-16 NOTE — Progress Notes (Signed)
 Pt placed on overnight pulse ox study for the night.

## 2023-06-16 NOTE — TOC Progression Note (Signed)
 Transition of Care [facility]) - Progression Note    Patient Details  Name: Priscilla Cantrell MRN: [id number] Date of Birth: 07/28/1935  Transition of Care [facility]) CM/SW Contact  Gertha Ku, LCSW Phone Number: 06/16/2023, 12:01 PM  Clinical Narrative:     Pt's insurance authorization from [employer] was approved. Pt will need a BiPAP if she qualify. A message was sent to the treatment team regarding what is needed for the DME company to arrange. TOC to follow.     Expected Discharge Plan: Skilled Nursing Facility   Expected Discharge Plan and Services       Living arrangements for the past 2 months: Single Family Home                                       Social Determinants of Health (SDOH) Interventions SDOH Screenings   Food Insecurity: No Food Insecurity (05/30/2023)  Housing: Low Risk  (05/30/2023)  Transportation Needs: No Transportation Needs (05/30/2023)  Utilities: Not At Risk (05/30/2023)  Alcohol  Screen: Low Risk  (02/01/2020)  Depression (PHQ2-9): Medium Risk (04/22/2023)  Financial Resource Strain: Low Risk  (03/19/2022)  Physical Activity: Sufficiently Active (03/19/2022)  Social Connections: Socially Integrated (05/30/2023)  Stress: No Stress Concern Present (03/19/2022)  Tobacco Use: Medium Risk (05/30/2023)    Readmission Risk Interventions     No data to display

## 2023-06-16 NOTE — Progress Notes (Signed)
 PROGRESS NOTE    Priscilla Cantrell  ZOX:[id number] DOB: 12/24/1935 DOA: 05/30/2023 PCP: Shelvia Dick, MD   Brief Narrative:  This 88 y.o. female with medical history significant for CKD stage II, depression, chronic hypoxic respiratory failure due to COPD on 2 L at baseline presented with worsening shortness of breath and cough and was admitted for sepsis due to community-acquired pneumonia along with acute on chronic hypoxic respiratory failure and COPD and CHF exacerbation.  She required BiPAP on presentation.  She has been on IV antibiotics, Solu-Medrol  and Lasix .  Cardiology following.  Patient continues to require high flow nasal cannula oxygen  during the day and BiPAP at night.  Pulmonary consulted as well.  Palliative care consulted for goals of care discussion.  Assessment & Plan:   Principal Problem:   Sepsis due to pneumonia [facility]) Active Problems:   Acute respiratory failure with hypoxia (HCC)   Acute diastolic CHF (congestive heart failure) (HCC)   Atrial fibrillation with RVR (HCC)   SVT (supraventricular tachycardia) (HCC)   COPD exacerbation (HCC)   Multifocal pneumonia   Infection due to parainfluenza virus 3   Viral pneumonia   Acute on chronic respiratory failure with hypoxia (HCC)   Pressure injury of skin   Goals of care, counseling/discussion   Palliative care encounter   Acute on chronic hypoxic respiratory failure: Sepsis secondary to community-acquired pneumonia: POA Possible COPD exacerbation: Acute on chronic diastolic CHF: -Respiratory status improving.  She was requiring up to 50L high flow nasal cannula oxygen  but now she is at 4-6 L-nasal cannula oxygen .  Still requiring BiPAP at night.  Completed a course of IV Levaquin  and ABX discontinued on 06/07/2023.  Stopped IV Solu-Medrol  to 40 mg every 12 hour, changed to prednisone  taper.  Continue current nebs.  Pulmonary following.  Respiratory panel PCR positive for parainfluenza virus 3 -Cardiology  following. Continue Diuretics as per cardiology.  Echo showed EF of 60 to 65% with grade 1 diastolic dysfunction.  Continue strict input and output.  Daily weight.  Fluid restriction.  - Palliative care following.  Patient remains full code. - Cardiology signed off.  Continue oral Lasix . - Pulmonology recommended patient will benefit from NIV ventilation. Patient is at high risk for ending up with worsening symptoms, worsening respiratory failure ,recurrent ER visits, recurrent hospitalizations without NIV. - TOC notified to work on arranging trilogy before discharge.  - Patient needs BIPAP, has to qualify , ABG pCO2 of 52 or higher. - Check overnight pulse oximetry.  Paroxysmal A-fib with RVR: -Currently heart rate is well-controlled.   Patient has been started on Eliquis  age appropriate dosing by cardiology.   Continue metoprolol  succinate and Cardizem .   Leukocytosis: - Now improving. Possibly from steroid use.   - Continue to monitor.   CKD stage II: - Creatinine stable.  Creatinine at baseline.   Hypokalemia: - Replaced.  Resolved.   Hyponatremia: - Mild.  Monitor.  Encourage oral intake.   Hypothyroidism: -Continue levothyroxine .   Hypertension: -Monitor blood pressure.  Continue Cardizem , metoprolol  succinate.   Amlodipine  on hold currently   GERD: -Continue Protonix    Physical deconditioning: - PT recommending SNF placement.  Will consult TOC once respiratory status improves   Oral thrush: - Continue nystatin     DVT prophylaxis: Eliquis  Code Status: Full code Family Communication:  No family at bed side Disposition Plan:   Status is: Inpatient Remains inpatient appropriate because: Severity of illness.   TOC notified to work on arranging trilogy before discharge.  -  Patient needs BIPAP, has to qualify , check overnight oximetry.  Consultants:  Cardiology Pulmonology  Procedures: None  Antimicrobials:  Anti-infectives (From admission, onward)     Start     Dose/Rate Route Frequency Ordered Stop   06/05/23 1800  levofloxacin  (LEVAQUIN ) IVPB 750 mg  Status:  Discontinued        750 mg 100 mL/hr over 90 Minutes Intravenous Every 48 hours 06/05/23 1142 06/07/23 1135   05/31/23 1730  levofloxacin  (LEVAQUIN ) IVPB 500 mg  Status:  Discontinued        500 mg 100 mL/hr over 60 Minutes Intravenous Every 24 hours 05/31/23 1631 06/05/23 1142   05/30/23 0900  doxycycline  (VIBRAMYCIN ) 100 mg in sodium chloride  0.9 % 250 mL IVPB  Status:  Discontinued        100 mg 125 mL/hr over 120 Minutes Intravenous Every 12 hours 05/30/23 0848 05/31/23 1631   05/30/23 0045  aztreonam  (AZACTAM ) 2 g in sodium chloride  0.9 % 100 mL IVPB        2 g 200 mL/hr over 30 Minutes Intravenous  Once 05/30/23 0037 05/30/23 0323   05/30/23 0045  metroNIDAZOLE  (FLAGYL ) IVPB 500 mg        500 mg 100 mL/hr over 60 Minutes Intravenous  Once 05/30/23 0037 05/30/23 0323   05/30/23 0045  vancomycin  (VANCOCIN ) IVPB 1000 mg/200 mL premix        1,000 mg 200 mL/hr over 60 Minutes Intravenous  Once 05/30/23 0037 05/30/23 0323      Subjective: Patient was seen and examined at bedside. Overnight events noted. Patient appears much improved.  She was sitting in the chair,  having breakfast. Weaned down to 3 L of supplemental oxygen .  Objective: Vitals:   06/16/23 0422 06/16/23 0500 06/16/23 0957 06/16/23 1321  BP: (!) 155/70   115/75  Pulse: 82   73  Resp: 16   16  Temp: 97.8 F (36.6 C)   (!) 97.4 F (36.3 C)  TempSrc: Oral   Oral  SpO2: 95%  93% 99%  Weight:  45.1 kg    Height:       No intake or output data in the 24 hours ending 06/16/23 1333  Filed Weights   06/14/23 0500 06/15/23 0500 06/16/23 0500  Weight: 48 kg 48.5 kg 45.1 kg    Examination:  General exam: Appears calm and comfortable, deconditioned, not in any acute distress. Respiratory system: CTA Bilaterally . Respiratory effort normal.  RR 15 Cardiovascular system: S1 & S2 heard, RRR. No JVD,  murmurs, rubs, gallops or clicks.  Gastrointestinal system: Abdomen is nondistended, soft and nontender.  Normal bowel sounds heard. Central nervous system: Alert and oriented X 3. No focal neurological deficits. Extremities: No edema, no cyanosis, no clubbing. Skin: No rashes, lesions or ulcers Psychiatry: Judgement and insight appear normal. Mood & affect appropriate.    Data Reviewed: I have personally reviewed following labs and imaging studies  CBC: Recent Labs  Lab 06/10/23 0429 06/12/23 0411 06/15/23 0348 06/16/23 0411  WBC 19.2* 17.2* 18.1* 16.3*  NEUTROABS 18.1*  --   --   --   HGB 13.3 13.4 12.5 12.3  HCT 41.0 43.1 40.1 38.3  MCV 88.7 92.1 91.3 88.5  PLT 256 214 238 259   Basic Metabolic Panel: Recent Labs  Lab 06/10/23 0429 06/12/23 0411 06/15/23 0348 06/16/23 0411  NA 132* 134* 134* 135  K 4.0 4.4 3.6 3.5  CL 88* 92* 93* 95*  CO2 33* 31 32 31  GLUCOSE 216* 210* 142* 110*  BUN 56* 45* 31* 29*  CREATININE 0.94 0.76 0.91 0.58  CALCIUM  8.3* 8.3* 8.2* 8.2*  MG 2.6* 2.5* 2.3 2.2  PHOS  --  3.8 2.5 3.0   GFR: Estimated Creatinine Clearance: 35.9 mL/min (by C-G formula based on SCr of 0.58 mg/dL). Liver Function Tests: No results for input(s): "AST", "ALT", "ALKPHOS", "BILITOT", "PROT", "ALBUMIN" in the last 168 hours.  No results for input(s): "LIPASE", "AMYLASE" in the last 168 hours. No results for input(s): "AMMONIA" in the last 168 hours. Coagulation Profile: No results for input(s): "INR", "PROTIME" in the last 168 hours. Cardiac Enzymes: No results for input(s): "CKTOTAL", "CKMB", "CKMBINDEX", "TROPONINI" in the last 168 hours. BNP (last 3 results) No results for input(s): "PROBNP" in the last 8760 hours. HbA1C: No results for input(s): "HGBA1C" in the last 72 hours. CBG: No results for input(s): "GLUCAP" in the last 168 hours. Lipid Profile: No results for input(s): "CHOL", "HDL", "LDLCALC", "TRIG", "CHOLHDL", "LDLDIRECT" in the last 72  hours. Thyroid  Function Tests: No results for input(s): "TSH", "T4TOTAL", "FREET4", "T3FREE", "THYROIDAB" in the last 72 hours. Anemia Panel: No results for input(s): "VITAMINB12", "FOLATE", "FERRITIN", "TIBC", "IRON", "RETICCTPCT" in the last 72 hours. Sepsis Labs: No results for input(s): "PROCALCITON", "LATICACIDVEN" in the last 168 hours.  No results found for this or any previous visit (from the past 240 hours).   Radiology Studies: No results found. Scheduled Meds:  apixaban   2.5 mg Oral BID   arformoterol   15 mcg Nebulization BID   budesonide  (PULMICORT ) nebulizer solution  0.5 mg Nebulization BID   diltiazem   240 mg Oral Daily   feeding supplement  237 mL Oral BID BM   fluticasone   2 spray Each Nare Daily   furosemide   60 mg Oral Daily   guaiFENesin   1,200 mg Oral BID   levothyroxine   75 mcg Oral Q0600   metoprolol  succinate  25 mg Oral Daily   nystatin   5 mL Oral QID   pantoprazole   40 mg Oral BID   polyethylene glycol  17 g Oral Daily   potassium chloride   20 mEq Oral Daily   predniSONE   30 mg Oral Q breakfast   Followed by   Cecily Cohen ON 06/20/2023] predniSONE   20 mg Oral Q breakfast   Followed by   Cecily Cohen ON 06/24/2023] predniSONE   10 mg Oral Q breakfast   revefenacin   175 mcg Nebulization Daily   senna-docusate  2 tablet Oral BID   venlafaxine   50 mg Oral BID   Continuous Infusions:   LOS: 17 days    Time spent: 35 mins    Magdalene School, MD [facility]   If 7PM-7AM, please contact night-coverage

## 2023-06-16 NOTE — Progress Notes (Signed)
 Occupational Therapy Treatment Patient Details Name: Priscilla Cantrell MRN: [id number] DOB: 10-24-1935 Today's Date: 06/16/2023   History of present illness Patient is a 88 year old female who presented on 4/26 with SOB. Patient was admitted with sepsis, a fib with RVR, acute on chronic hypoxic respiratory failure, COPD exacerbation, acute on chronic diastolic CHF and CAP. Pt required HHFNC.  Pt's respiratory status improving but currently on 6L however requiring BiPAP at night. 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   OT comments  Patient was able to engage in toileting in bathroom today while maintaining vitals on 3L/min with cues for deep breathing with some SOB noted. Patient was educated on deep breathing sequencing with voiding to avoid holding breath. Patient demonstrated understanding. Patient returned to bed at end of session per nurse for enema. Patient will benefit from continued inpatient follow up therapy, <3 hours/day. [name] discharge plan remains appropriate at this time. OT will continue to follow acutely.        If plan is discharge home, recommend the following:  A lot of help with bathing/dressing/bathroom;A lot of help with walking and/or transfers;Assistance with cooking/housework;Direct supervision/assist for medications management;Assist for transportation;Help with stairs or ramp for entrance;Direct supervision/assist for financial management   Equipment Recommendations  None recommended by OT       Precautions / Restrictions Precautions Precautions: Fall;Posterior Hip Precaution Booklet Issued: No Recall of Precautions/Restrictions: Impaired Precaution/Restrictions Comments: Pt able to recall 2/3 Post. Hip Precautions. Reviewed hip precautions Restrictions Weight Bearing Restrictions Per Provider Order: No Other Position/Activity Restrictions:  Right Posterior hip Revision after dislocation on 02/10/23       Mobility Bed Mobility Overal bed mobility: Needs Assistance Bed Mobility: Supine to Sit, Sit to Supine     Supine to sit: Min assist Sit to supine: Contact guard assist                    ADL either performed or assessed with clinical judgement   ADL Overall ADL's : Needs assistance/impaired       Toilet Transfer: Contact guard assist;Rolling walker (2 wheels);Ambulation Toilet Transfer Details (indicate cue type and reason): to bathroom and then to recliner with increased time and cues to keep walker close. Toileting- Clothing Manipulation and Hygiene: Sit to/from stand;Minimal assistance Toileting - Clothing Manipulation Details (indicate cue type and reason): for clothing management and max A for hygiene with patient reporting increased pain with standing after passing small BM. patient was educated on importance of movement for BM and patient verbalized understanding.       General ADL Comments: patietn was lying in bed reporting pain in abdomen and LLE. patient reporting that she needed to "keep them warm" to be able to void her bladder and bowels. patient reported that she had not voided her bladder today. patient was asked if she had anything to drink today while advancing to EOB. patient reported " i dont have anything in front of me". patient's tray was moved from her side to be able to progress to EOB and bathroom as per patient request. patient was unable to engage in i love youy massage technique for BM stimulation with patient unable to follow commands to complete. patient not following R to L technique and not applying any pressure to area.      Cognition Arousal: Alert Behavior During Therapy: WFL for tasks assessed/performed Cognition: No apparent impairments  Pertinent Vitals/ Pain       Pain Assessment Pain Assessment: Faces Faces Pain Scale: Hurts a  little bit Pain Location: abdomen (patient reported from needing to have BM) Pain Descriptors / Indicators: Grimacing Pain Intervention(s): Monitored during session, Repositioned         Frequency  Min 2X/week        Progress Toward Goals  OT Goals(current goals can now be found in the care plan section)  Progress towards OT goals: Progressing toward goals     Plan         AM-PAC OT "6 Clicks" Daily Activity     Outcome Measure   Help from another person eating meals?: A Little Help from another person taking care of personal grooming?: A Little Help from another person toileting, which includes using toliet, bedpan, or urinal?: A Lot Help from another person bathing (including washing, rinsing, drying)?: A Lot Help from another person to put on and taking off regular upper body clothing?: A Little Help from another person to put on and taking off regular lower body clothing?: A Lot 6 Click Score: 15    End of Session Equipment Utilized During Treatment: Oxygen ;Gait belt;Rolling walker (2 wheels)  OT Visit Diagnosis: Other abnormalities of gait and mobility (R26.89);Muscle weakness (generalized) (M62.81);History of falling (Z91.81)   Activity Tolerance Patient limited by fatigue   Patient Left in bed;with call bell/phone within reach;Other (comment) (with nurse in room)   Nurse Communication Other (comment) (pill found in room on floor , and patients reporting pain in abodmen and desire to void more)        Time: 5784-6962 OT Time Calculation (min): 26 min  Charges: OT General Charges $OT Visit: 1 Visit OT Treatments $Self Care/Home Management : 23-37 mins  Wynette Heckler, MS Acute Rehabilitation Department Office# [phone]   Jame Maze 06/16/2023, 12:41 PM

## 2023-06-16 NOTE — Progress Notes (Signed)
   06/16/23 2203  Vent Select  Invasive or Noninvasive Noninvasive  Adult Vent Y  Adult Ventilator Settings  Vent Type Servo-air  Vent Mode BIPAP  Set Rate 15 bmp  FiO2 (%) 40 %  I Time 0.9 Sec(s)  Pressure Control 8 cmH20  PEEP 6 cmH20  Adult Ventilator Measurements  Peak Airway Pressure 13 L/min  Mean Airway Pressure 10 cmH20  Resp Rate Spontaneous 6 br/min  Resp Rate Total 21 br/min  Exhaled Vt 729 mL  Measured Ve 14.2 L  I:E Ratio Measured 1:2.0  Auto PEEP 0 cmH20  Total PEEP 6 cmH20  SpO2 94 %  Adult Ventilator Alarms  Alarms On Y  Ve High Alarm 24 L/min  Ve Low Alarm 5 L/min  Resp Rate High Alarm 36 br/min  Resp Rate Low Alarm 8  PEEP Low Alarm 3 cmH2O  Press High Alarm 25 cmH2O  VAP Prevention  HOB> 30 Degrees Y

## 2023-06-16 NOTE — Plan of Care (Signed)

## 2023-06-17 ENCOUNTER — Other Ambulatory Visit: Payer: Self-pay | Admitting: Family Medicine

## 2023-06-17 DIAGNOSIS — R41841 Cognitive communication deficit: Secondary | ICD-10-CM | POA: Diagnosis not present

## 2023-06-17 DIAGNOSIS — J9611 Chronic respiratory failure with hypoxia: Secondary | ICD-10-CM | POA: Diagnosis not present

## 2023-06-17 DIAGNOSIS — J449 Chronic obstructive pulmonary disease, unspecified: Secondary | ICD-10-CM | POA: Diagnosis not present

## 2023-06-17 DIAGNOSIS — R1310 Dysphagia, unspecified: Secondary | ICD-10-CM | POA: Diagnosis not present

## 2023-06-17 DIAGNOSIS — I480 Paroxysmal atrial fibrillation: Secondary | ICD-10-CM | POA: Diagnosis not present

## 2023-06-17 DIAGNOSIS — R300 Dysuria: Secondary | ICD-10-CM | POA: Diagnosis not present

## 2023-06-17 DIAGNOSIS — Z515 Encounter for palliative care: Secondary | ICD-10-CM | POA: Diagnosis not present

## 2023-06-17 DIAGNOSIS — I5032 Chronic diastolic (congestive) heart failure: Secondary | ICD-10-CM | POA: Diagnosis not present

## 2023-06-17 DIAGNOSIS — N189 Chronic kidney disease, unspecified: Secondary | ICD-10-CM | POA: Diagnosis not present

## 2023-06-17 DIAGNOSIS — I10 Essential (primary) hypertension: Secondary | ICD-10-CM | POA: Diagnosis not present

## 2023-06-17 DIAGNOSIS — J189 Pneumonia, unspecified organism: Secondary | ICD-10-CM | POA: Diagnosis not present

## 2023-06-17 DIAGNOSIS — R262 Difficulty in walking, not elsewhere classified: Secondary | ICD-10-CM | POA: Diagnosis not present

## 2023-06-17 DIAGNOSIS — G4700 Insomnia, unspecified: Secondary | ICD-10-CM | POA: Diagnosis not present

## 2023-06-17 DIAGNOSIS — E119 Type 2 diabetes mellitus without complications: Secondary | ICD-10-CM | POA: Diagnosis not present

## 2023-06-17 DIAGNOSIS — Z471 Aftercare following joint replacement surgery: Secondary | ICD-10-CM | POA: Diagnosis not present

## 2023-06-17 DIAGNOSIS — D649 Anemia, unspecified: Secondary | ICD-10-CM | POA: Diagnosis not present

## 2023-06-17 DIAGNOSIS — Z7401 Bed confinement status: Secondary | ICD-10-CM | POA: Diagnosis not present

## 2023-06-17 DIAGNOSIS — M24451 Recurrent dislocation, right hip: Secondary | ICD-10-CM | POA: Diagnosis not present

## 2023-06-17 DIAGNOSIS — R2681 Unsteadiness on feet: Secondary | ICD-10-CM | POA: Diagnosis not present

## 2023-06-17 DIAGNOSIS — A419 Sepsis, unspecified organism: Secondary | ICD-10-CM | POA: Diagnosis not present

## 2023-06-17 DIAGNOSIS — J9610 Chronic respiratory failure, unspecified whether with hypoxia or hypercapnia: Secondary | ICD-10-CM | POA: Diagnosis not present

## 2023-06-17 DIAGNOSIS — M6281 Muscle weakness (generalized): Secondary | ICD-10-CM | POA: Diagnosis not present

## 2023-06-17 DIAGNOSIS — F0390 Unspecified dementia without behavioral disturbance: Secondary | ICD-10-CM | POA: Diagnosis not present

## 2023-06-17 MED ORDER — FUROSEMIDE 20 MG PO TABS: 60.0000 mg | ORAL_TABLET | Freq: Every day | ORAL | 0 refills | Status: DC

## 2023-06-17 MED ORDER — SENNOSIDES-DOCUSATE SODIUM 8.6-50 MG PO TABS: 2.0000 | ORAL_TABLET | Freq: Two times a day (BID) | ORAL | 0 refills | Status: DC

## 2023-06-17 MED ORDER — APIXABAN 2.5 MG PO TABS: 2.5000 mg | ORAL_TABLET | Freq: Two times a day (BID) | ORAL | 0 refills | Status: DC

## 2023-06-17 MED ORDER — POLYETHYLENE GLYCOL 3350 17 G PO PACK: 17.0000 g | PACK | Freq: Every day | ORAL | 0 refills | Status: DC | PRN

## 2023-06-17 MED ORDER — METOPROLOL SUCCINATE ER 25 MG PO TB24: 25.0000 mg | ORAL_TABLET | Freq: Every day | ORAL | 0 refills | Status: AC

## 2023-06-17 MED ORDER — POTASSIUM CHLORIDE CRYS ER 20 MEQ PO TBCR: 20.0000 meq | EXTENDED_RELEASE_TABLET | Freq: Every day | ORAL | 0 refills | Status: AC

## 2023-06-17 MED ORDER — PREDNISONE 10 MG PO TABS: ORAL_TABLET | ORAL | 0 refills | Status: DC

## 2023-06-17 MED ORDER — GUAIFENESIN ER 600 MG PO TB12: 1200.0000 mg | ORAL_TABLET | Freq: Two times a day (BID) | ORAL | 0 refills | Status: AC

## 2023-06-17 MED ORDER — DILTIAZEM HCL ER COATED BEADS 240 MG PO CP24: 240.0000 mg | ORAL_CAPSULE | Freq: Every day | ORAL | 0 refills | Status: AC

## 2023-06-17 NOTE — Progress Notes (Signed)
  Daily Progress Note   Patient Name: Priscilla Cantrell       Date: 06/17/2023 DOB: 10/07/1935  Age: 88 y.o. MRN#: [id number] Attending Physician: Audria Leather, MD Primary Care Physician: Shelvia Dick, MD Admit Date: 05/30/2023 Length of Stay: 18 days  Reason for Consultation/Follow-up: Establishing goals of care  Subjective:   CC: Patient notes breathing feeling better every day.   Significant other Priscilla Cantrell at bedside.  Following up regarding complex medical decision making.  Subjective:  Reviewed EMR prior to presenting to bedside.  O2 requirements noted.     Objective:   Vital Signs:  BP 126/62 (BP Location: Left Arm)   Pulse 67   Temp 97.7 F (36.5 C) (Oral)   Resp 18   Ht 5\' 2"  (1.575 m)   Wt 46.1 kg   SpO2 97%   BMI 18.59 kg/m   Physical Exam: General: NAD, alert, sitting up in chair at bedside, frail Cardiovascular: RRR Respiratory: does not appear dyspneic, O2 requirements noted.  Neuro: A&Ox4, following commands easily Psych: appropriately answers all questions  Imaging:  I personally reviewed recent imaging.   Assessment & Plan:   Assessment: Patient is an 88 year old female with a past medical history of acute on chronic hypoxic respiratory failure due to parainfluenza pneumonia, acute HFpEF, pulmonary hypertension, and new onset A-fib.  PCCM and cardiology consulted for recommendations.  Palliative medicine team consulted to assist with complex medical decision making.  Recommendations/Plan: # Complex medical decision making/goals of care:  -full code   -  Code Status: Full Code  # Psychosocial Support:  - Significant Priscilla Cantrell  # Discharge Planning: Skilled Nursing Facility for rehab with Palliative care service follow-up  - [facility] consult to assist with outpatient palliative medicine referral. Recommend outpatient pulmonary medicine follow up.   Discussed with: Patient, significant other Priscilla Cantrell.   Thank you for  allowing the palliative care team to participate in the care Priscilla Cantrell.  Mod MDM Lujean Sake MD.  Palliative Care Provider PMT # [phone]  If patient remains symptomatic despite maximum doses, please call PMT at [phone] between 0700 and 1900. Outside of these hours, please call attending, as PMT does not have night coverage.

## 2023-06-17 NOTE — TOC Transition Note (Signed)
 Transition of Care [facility]) - Discharge Note   Patient Details  Name: Priscilla Cantrell MRN: [id number] Date of Birth: August 22, 1935  Transition of Care [facility]) CM/SW Contact:  Gertha Ku, LCSW Phone Number: 06/17/2023, 11:48 AM   Clinical Narrative:     CSW spoke with Kristin from Admissions at [facility] to inform her that the pt does not qualify for BiPAP. Kristin stated that the pt can be admitted to the facility today, but the BiPAP must be discontinued and noted in the pt's discharge summary.  Pt to d/c to [facility] , room 39, RN to call report [phone]. Facility will make OP palliative referral. PTAR called ,no further TOC needs, TOC sign off.   Final next level of care: Skilled Nursing Facility Barriers to Discharge: Barriers Resolved   Patient Goals and CMS Choice Patient states their goals for this hospitalization and ongoing recovery are:: SNF to get stonger          Discharge Placement              Patient chooses bed at: [facility] [address] Patient to be transferred to facility by: [facility] Name of family member notified: Craig,James (Friend)  [phone] (Mobile Patient and family notified of of transfer: 06/17/23  Discharge Plan and Services Additional resources added to the After Visit Summary for                                       Social Drivers of Health (SDOH) Interventions SDOH Screenings   Food Insecurity: No Food Insecurity (05/30/2023)  Housing: Low Risk  (05/30/2023)  Transportation Needs: No Transportation Needs (05/30/2023)  Utilities: Not At Risk (05/30/2023)  Alcohol  Screen: Low Risk  (02/01/2020)  Depression (PHQ2-9): Medium Risk (04/22/2023)  Financial Resource Strain: Low Risk  (03/19/2022)  Physical Activity: Sufficiently Active (03/19/2022)  Social Connections: Socially Integrated (05/30/2023)  Stress: No Stress Concern Present (03/19/2022)  Tobacco Use: Medium Risk (05/30/2023)     Readmission Risk  Interventions     No data to display

## 2023-06-17 NOTE — Progress Notes (Signed)
 Physical Therapy Treatment Patient Details Name: Priscilla Cantrell MRN: [id number] DOB: [birth year] Today's Date: 06/17/2023   History of Present Illness Patient is a 88 year old female who presented on 4/26 with SOB. Patient was admitted with sepsis, a fib with RVR, acute on chronic hypoxic respiratory failure, COPD exacerbation, acute on chronic diastolic CHF and CAP. Pt required HHFNC.  Pt's respiratory status improving but currently on 6L however requiring BiPAP at night. 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    PT Comments   Pt admitted with above diagnosis.  Pt currently with functional limitations due to the deficits listed below (see PT Problem List). Pt seated in recliner when PT arrived, visitor present. Pt indicated she might d/c to SNF today. Pt reports she wants to do what she needs to get better. Pt required S for sit to stand  from recliner and min cues for AD and UE placement, gait tasks in hallway 80 feet with RW, CGA and min cues. Pt on 4 L/min when PT arrived and 97% and 4 L/min throughout intervention and maintained 97% with pt preforming return demonstration of pursed lip breathing. Pt left seated in recliner, all needs in place, visitor present and respiratory therapist. Patient will benefit from continued inpatient follow up therapy, <3 hours/day.  Pt will benefit from acute skilled PT to increase their independence and safety with mobility to allow discharge.      If plan is discharge home, recommend the following: A little help with walking and/or transfers;A little help with bathing/dressing/bathroom;Assistance with cooking/housework;Assist for transportation;Help with stairs or ramp for entrance   Can travel by private vehicle     Yes  Equipment Recommendations  None recommended by PT    Recommendations for Other Services       Precautions /  Restrictions Precautions Precautions: Fall;Posterior Hip Precaution Booklet Issued: No Recall of Precautions/Restrictions: Impaired Precaution/Restrictions Comments: pt able to observe R posterior hip precuations with functional mobiltiy tasks however unable to recall 3/3 (2/3) Restrictions Weight Bearing Restrictions Per Provider Order: No Other Position/Activity Restrictions: Right Posterior hip Revision after dislocation on 02/10/23     Mobility  Bed Mobility               General bed mobility comments: pt seated in recliner when PT arrived    Transfers Overall transfer level: Needs assistance Equipment used: Rolling walker (2 wheels) Transfers: Sit to/from Stand Sit to Stand: Supervision           General transfer comment: pt required min cues for safety and proper AD and UE placement    Ambulation/Gait Ambulation/Gait assistance: Contact guard assist Gait Distance (Feet): 80 Feet Assistive device: Rolling walker (2 wheels) Gait Pattern/deviations: Decreased stride length, Step-through pattern Gait velocity: decreased     General Gait Details: pt on 4 L/min when PT arrived and 97%, pt remained on 4 L/min with gait tasks and maintained O2 saturation of 97% and able to perform return demonstration with pursed lip breathing, cues for safety and posture   Stairs             Wheelchair Mobility     Tilt Bed    Modified Rankin (Stroke Patients Only)       Balance Overall balance assessment: Needs assistance Sitting-balance support: No upper extremity supported Sitting balance-Leahy Scale: Good     Standing balance support: Bilateral upper extremity supported, During functional activity, Reliant on  assistive device for balance Standing balance-Leahy Scale: Poor Standing balance comment: B UE support at RW with close S                            Communication Communication Communication: No apparent difficulties  Cognition Arousal:  Alert Behavior During Therapy: WFL for tasks assessed/performed   PT - Cognitive impairments: Problem solving, Safety/Judgement                         Following commands: Intact      Cueing Cueing Techniques: Verbal cues  Exercises      General Comments        Pertinent Vitals/Pain Pain Assessment Pain Assessment: 0-10 Pain Score: 2  Pain Location: abdomen pt seated with hot packs on abdomen Pain Descriptors / Indicators: Grimacing Pain Intervention(s): Limited activity within patient's tolerance, Monitored during session, Repositioned    Home Living                          Prior Function            PT Goals (current goals can now be found in the care plan section) Acute Rehab PT Goals Patient Stated Goal: likes to walk PT Goal Formulation: With patient Time For Goal Achievement: 06/17/23 Potential to Achieve Goals: Good Progress towards PT goals: Progressing toward goals    Frequency    Min 3X/week      PT Plan      Co-evaluation              AM-PAC PT "6 Clicks" Mobility   Outcome Measure  Help needed turning from your back to your side while in a flat bed without using bedrails?: A Little Help needed moving from lying on your back to sitting on the side of a flat bed without using bedrails?: A Little Help needed moving to and from a bed to a chair (including a wheelchair)?: A Little Help needed standing up from a chair using your arms (e.g., wheelchair or bedside chair)?: A Little Help needed to walk in hospital room?: A Little Help needed climbing 3-5 steps with a railing? : A Lot 6 Click Score: 17    End of Session Equipment Utilized During Treatment: Gait belt;Oxygen  Activity Tolerance: Patient tolerated treatment well Patient left: in chair;with call bell/phone within reach;with family/visitor present Nurse Communication: Mobility status PT Visit Diagnosis: Difficulty in walking, not elsewhere classified  (R26.2);History of falling (Z91.81)     Time: 2956-2130 PT Time Calculation (min) (ACUTE ONLY): 21 min  Charges:    $Gait Training: 8-22 mins PT General Charges $$ ACUTE PT VISIT: 1 Visit                     Cary Clarks, PT Acute Rehab    Priscilla Cantrell 06/17/2023, 12:20 PM

## 2023-06-17 NOTE — Plan of Care (Signed)
 Report called to [facility] Pt discharge folder ready Ptar at bedside to transport Family left with pt belongings   Problem: Clinical Measurements: Goal: Ability to maintain clinical measurements within normal limits will improve Outcome: Adequate for Discharge   Problem: Clinical Measurements: Goal: Diagnostic test results will improve Outcome: Adequate for Discharge

## 2023-06-17 NOTE — Discharge Summary (Signed)
 Physician Discharge Summary  Priscilla Cantrell ZOX:[id number] DOB: [birth year] DOA: 05/30/2023  PCP: Shelvia Dick, MD  Admit date: 05/30/2023 Discharge date: 06/17/2023  Admitted From: Home Disposition: SNF  Recommendations for Outpatient Follow-up:  Follow up with SNF provider at earliest convenience Outpatient follow-up with pulmonary, cardiology and palliative care Follow up in ED if symptoms worsen or new appear   Home Health: No Equipment/Devices: Continue supplemental oxygen  by nasal cannula Discharge Condition: Guarded CODE STATUS: Full Diet recommendation: Heart healthy  Brief/Interim Summary: 88 y.o. female with medical history significant for CKD stage II, depression, chronic hypoxic respiratory failure due to COPD on 2 L at baseline presented with worsening shortness of breath and cough and was admitted for sepsis due to community-acquired pneumonia along with acute on chronic hypoxic respiratory failure and COPD and CHF exacerbation.  She required BiPAP on presentation.  She has been on IV antibiotics, Solu-Medrol  and Lasix .  Cardiology following.  Patient continues to require high flow nasal cannula oxygen  during the day and BiPAP at night.  Pulmonary consulted as well.  Palliative care consulted for goals of care discussion.  She has had a long hospitalization.  She has already been switched to oral Lasix  and has been started on oral Eliquis  by cardiology and cardiology has signed off.  Pulmonary recommended tapering oral prednisone  and has signed off.  Patient apparently did not qualify for BiPAP.  She will need to follow-up with pulmonary as an outpatient.  PT recommended SNF placement.  She will be discharged to SNF today if bed is available.  Discharge Diagnoses:   Acute on chronic hypoxic respiratory failure Sepsis: Present on admission: Resolved Community-acquired pneumonia Possible COPD exacerbation Acute on chronic diastolic CHF - Respiratory status improving.  Was  requiring up to 50L high flow nasal cannula oxygen  during the hospitalization but currently on 3 to 4 L oxygen  via nasal cannula.  Required BiPAP at night.  Completed a course of IV Levaquin  and discontinued on 06/07/2023.  Treated with IV Solu-Medrol  and currently on tapering doses of oral prednisone  as per pulmonary recommendations.  Treated with nebs.  Respiratory panel PCR positive for parainfluenza virus 3 - Treated with IV diuretics and subsequently switched to oral Lasix  by cardiology.  Echo showed EF of 60 to 65% with grade 1 diastolic dysfunction.  Continue diet and fluid restriction.  Has diuresed well during this hospitalization.  Cardiology has already signed off.  Outpatient follow-up with cardiology.  Continue Toprol -XL - Palliative care evaluated the patient during this hospitalization.  Patient remains full code.  Outpatient follow-up with palliative care  -Patient apparently did not qualify for BiPAP.  She will need to follow-up with pulmonary as an outpatient.  - PT recommended SNF placement.  Discharge to SNF today if bed is available.  paroxysmal A-fib with RVR - Currently rate controlled mostly.  Patient has been started on Eliquis  age appropriate dosing by cardiology.  Continue metoprolol  succinate and Cardizem    Leukocytosis - Improving.  Possibly from steroid use.  Monitor intermittently as an outpatient.   CKD stage II - Creatinine stable.     Hypokalemia - Improving.  Continue supplementation.   Hyponatremia - Improved.   Hypothyroidism -Continue levothyroxine    Hypertension -Continue Cardizem , Lasix  and metoprolol  succinate.  Amlodipine  on hold currently   GERD -Continue Protonix    Physical deconditioning - PT recommending SNF placement.  Will consult TOC once respiratory status improves   Oral thrush - Treated with nystatin .  This can be resumed at [facility]  if needed.   Discharge Instructions  Discharge Instructions     Diet general   Complete by: As  directed    Increase activity slowly   Complete by: As directed    No wound care   Complete by: As directed    Pulmonary Visit   Complete by: As directed    Hospital follow-up   Reason for referral: Other Pulmonary      Allergies as of 06/17/2023       Reactions   Losartan  Other (See Comments)   hyperkalemia   Penicillins Hives, Swelling, Rash   Has patient had a PCN reaction causing immediate rash, facial/tongue/throat swelling, SOB or lightheadedness with hypotension: YES Has patient had a PCN reaction causing severe rash involving mucus membranes or skin necrosis: NO Has patient had a PCN reaction that required hospitalization NO Has patient had a PCN reaction occurring within the last 10 years: NO If all of the above answers are "NO", then may proceed with Cephalosporin use.   Cefixime [kdc:cefixime] Other (See Comments)   unspecified   Gabapentin  Other (See Comments)   Feels woozy   Indocin [indomethacin] Other (See Comments)   Painful tongue and throat   Singulair  [montelukast  Sodium]    Chest tightness, decreased mental clarity   Sulfa Antibiotics Other (See Comments)   unspecified   Ace Inhibitors Other (See Comments)   cough   Statins Other (See Comments)   Myalgias        Medication List     STOP taking these medications    amLODipine  10 MG tablet Commonly known as: NORVASC    azithromycin  250 MG tablet Commonly known as: ZITHROMAX        TAKE these medications    acetaminophen  325 MG tablet Commonly known as: TYLENOL  Take 2 tablets (650 mg total) by mouth every 6 (six) hours. What changed:  when to take this reasons to take this   albuterol  (2.5 MG/3ML) 0.083% nebulizer solution Commonly known as: PROVENTIL  Take 2.5 mg by nebulization every 6 (six) hours as needed for wheezing or shortness of breath.   albuterol  108 (90 Base) MCG/ACT inhaler Commonly known as: ProAir  HFA Inhale 2 puffs into the lungs every 4 (four) hours as needed for  wheezing or shortness of breath.   apixaban  2.5 MG Tabs tablet Commonly known as: ELIQUIS  Take 1 tablet (2.5 mg total) by mouth 2 (two) times daily.   diltiazem  240 MG 24 hr capsule Commonly known as: CARDIZEM  CD Take 1 capsule (240 mg total) by mouth daily. Start taking on: Jun 18, 2023   fluticasone  50 MCG/ACT nasal spray Commonly known as: FLONASE  Place 2 sprays into both nostrils daily.   furosemide  20 MG tablet Commonly known as: LASIX  Take 3 tablets (60 mg total) by mouth daily. Start taking on: Jun 18, 2023   guaiFENesin  600 MG 12 hr tablet Commonly known as: MUCINEX  Take 2 tablets (1,200 mg total) by mouth 2 (two) times daily.   levothyroxine  100 MCG tablet Commonly known as: SYNTHROID  TAKE 1 TABLET BY MOUTH EVERY DAY   lidocaine  2 % solution Commonly known as: XYLOCAINE  Apply 1/2 ml to affected area of mouth 3 times per day as needed for pain   metoprolol  succinate 25 MG 24 hr tablet Commonly known as: TOPROL -XL Take 1 tablet (25 mg total) by mouth daily. Start taking on: Jun 18, 2023   pantoprazole  40 MG tablet Commonly known as: PROTONIX  Take 1 tablet (40 mg total) by mouth 2 (two) times daily.  polyethylene glycol 17 g packet Commonly known as: MIRALAX  / GLYCOLAX  Take 17 g by mouth daily as needed.   potassium chloride  SA 20 MEQ tablet Commonly known as: KLOR-CON  M Take 1 tablet (20 mEq total) by mouth daily. Start taking on: Jun 18, 2023   predniSONE  10 MG tablet Commonly known as: DELTASONE  3 tabs po every day x 2d, then 2 tabs po every day x 4d, then 1 tab po every day x 4d then stop Start taking on: Jun 18, 2023 What changed: additional instructions   Pulse Oximeter Misc Check oxygen  level as needed   senna-docusate 8.6-50 MG tablet Commonly known as: Senokot-S Take 2 tablets by mouth 2 (two) times daily.   Trelegy Ellipta  100-62.5-25 MCG/ACT Aepb Generic drug: Fluticasone -Umeclidin-Vilant Inhale 1 puff into the lungs daily.    venlafaxine  50 MG tablet Commonly known as: EFFEXOR  Take 50 mg by mouth 2 (two) times daily.        Follow-up Information     Ervin Heath, [place] Follow up on 07/02/2023.   Specialties: Cardiology, Radiology Why: 8:50AM. Post hospital follow up Contact information: [address] [place] [address] [phone]         [facility] Follow up on 06/16/2023.   Specialty: Cardiology Why: 1 week BMET blood work in [employer] on the first floor. Contact information: [address] [place]  [address] [phone]               Allergies  Allergen Reactions   Losartan  Other (See Comments)    hyperkalemia   Penicillins Hives, Swelling and Rash    Has patient had a PCN reaction causing immediate rash, facial/tongue/throat swelling, SOB or lightheadedness with hypotension: YES Has patient had a PCN reaction causing severe rash involving mucus membranes or skin necrosis: NO Has patient had a PCN reaction that required hospitalization NO Has patient had a PCN reaction occurring within the last 10 years: NO If all of the above answers are "NO", then may proceed with Cephalosporin use.    Cefixime [Kdc:Cefixime] Other (See Comments)    unspecified   Gabapentin  Other (See Comments)    Feels woozy   Indocin [Indomethacin] Other (See Comments)    Painful tongue and throat   Singulair  [Montelukast  Sodium]     Chest tightness, decreased mental clarity   Sulfa Antibiotics Other (See Comments)    unspecified   Ace Inhibitors Other (See Comments)    cough   Statins Other (See Comments)    Myalgias     Consultations: Cardiology/pulmonary   Procedures/Studies: DG Chest Port 1 View Result Date: 06/05/2023 CLINICAL DATA:  Acute respiratory failure. EXAM: PORTABLE CHEST 1 VIEW COMPARISON:  One-view chest x-ray 06/03/2023 FINDINGS: The heart is enlarged. Arch atherosclerotic changes are again noted at the aortic  arch. Aeration of both lungs is markedly improved. Bilateral pleural effusions remain. Residual bibasilar airspace opacities are worse right than left. Neck is Mild degenerative changes are present in the shoulders and thoracic spine. IMPRESSION: 1. Marked improvement in aeration of both lungs. 2. Residual bibasilar airspace opacities are worse right than left. This most likely reflects atelectasis. Infection is not excluded. 3. Bilateral pleural effusions. Electronically Signed   By: Audree Leas M.D.   On: 06/05/2023 11:29   DG CHEST PORT 1 VIEW Result Date: 06/03/2023 CLINICAL DATA:  Dyspnea EXAM: PORTABLE CHEST 1 VIEW COMPARISON:  05/30/2023 FINDINGS: Cardiac shadow is stable. Aortic calcifications are  noted. Increasing basilar airspace opacity and small effusions are noted when compared with the prior exam. No acute bony abnormality is noted. IMPRESSION: Increasing bibasilar airspace opacities with associated effusions. Electronically Signed   By: Violeta Grey M.D.   On: 06/03/2023 11:01   ECHOCARDIOGRAM COMPLETE Result Date: 06/02/2023    ECHOCARDIOGRAM REPORT   Patient Name:   MICHAELAH HELLARD Date of Exam: 06/02/2023 Medical Rec #:  [id number]        Height:       62.0 in Accession #:    [id number]       Weight:       121.9 lb Date of Birth:  16-Dec-1935         BSA:          1.549 m Patient Age:    88 years         BP:           147/80 mmHg Patient Gender: F                HR:           76 bpm. Exam Location:  Inpatient Procedure: 2D Echo, Color Doppler and Cardiac Doppler (Both Spectral and Color            Flow Doppler were utilized during procedure). Indications:    I50.31 Acute diastolic (congestive) heart failure  History:        Patient has prior history of Echocardiogram examinations, most                 recent 06/21/2021. Signs/Symptoms:Shortness of Breath; Risk                 Factors:Hypertension.  Sonographer:    Andrena Bang Referring Phys: [id number] TRACI R TURNER  Sonographer Comments: Pt  sitting up. SOB and coughing throughout exam. IMPRESSIONS  1. Left ventricular ejection fraction, by estimation, is 60 to 65%. The left ventricle has normal function. The left ventricle has no regional wall motion abnormalities. Left ventricular diastolic parameters are consistent with Grade I diastolic dysfunction (impaired relaxation). Elevated left atrial pressure.  2. Right ventricular systolic function is normal. The right ventricular size is moderately enlarged. There is severely elevated pulmonary artery systolic pressure.  3. Right atrial size was moderately dilated.  4. The mitral valve is normal in structure. No evidence of mitral valve regurgitation.  5. Tricuspid valve regurgitation is moderate to severe.  6. The aortic valve is tricuspid. Aortic valve regurgitation is not visualized. No aortic stenosis is present.  7. There is borderline dilatation of the ascending aorta, measuring 38 mm.  8. The inferior vena cava is dilated in size with <50% respiratory variability, suggesting right atrial pressure of 15 mmHg. Comparison(s): Prior images reviewed side by side. There is interval dilation of the right ventricle, worsened tricuspid insufficiency and higher systolic pulmonary artery pressure. FINDINGS  Left Ventricle: Left ventricular ejection fraction, by estimation, is 60 to 65%. The left ventricle has normal function. The left ventricle has no regional wall motion abnormalities. The left ventricular internal cavity size was normal in size. There is  no left ventricular hypertrophy. Left ventricular diastolic parameters are consistent with Grade I diastolic dysfunction (impaired relaxation). Elevated left atrial pressure. Right Ventricle: The right ventricular size is moderately enlarged. Right vetricular wall thickness was not well visualized. Right ventricular systolic function is normal. There is severely elevated pulmonary artery systolic pressure. The tricuspid regurgitant velocity is 3.49 m/s, and  with an  assumed right atrial pressure of 15 mmHg, the estimated right ventricular systolic pressure is 63.7 mmHg. Left Atrium: Left atrial size was normal in size. Right Atrium: Right atrial size was moderately dilated. Pericardium: There is no evidence of pericardial effusion. Mitral Valve: The mitral valve is normal in structure. No evidence of mitral valve regurgitation. Tricuspid Valve: The tricuspid valve is normal in structure. Tricuspid valve regurgitation is moderate to severe. Aortic Valve: The aortic valve is tricuspid. Aortic valve regurgitation is not visualized. No aortic stenosis is present. Aortic valve mean gradient measures 4.0 mmHg. Aortic valve peak gradient measures 8.6 mmHg. Aortic valve area, by VTI measures 2.12 cm. Pulmonic Valve: The pulmonic valve was grossly normal. Pulmonic valve regurgitation is trivial. No evidence of pulmonic stenosis. Aorta: The aortic root is normal in size and structure. There is borderline dilatation of the ascending aorta, measuring 38 mm. Venous: The inferior vena cava is dilated in size with less than 50% respiratory variability, suggesting right atrial pressure of 15 mmHg. IAS/Shunts: The interatrial septum was not well visualized.  LEFT VENTRICLE PLAX 2D LVIDd:         4.30 cm     Diastology LVIDs:         2.90 cm     LV e' medial:    3.70 cm/s LV PW:         0.90 cm     LV E/e' medial:  22.3 LV IVS:        0.70 cm     LV e' lateral:   8.49 cm/s LVOT diam:     2.00 cm     LV E/e' lateral: 9.7 LV SV:         62 LV SV Index:   40 LVOT Area:     3.14 cm  LV Volumes (MOD) LV vol d, MOD A2C: 55.8 ml LV vol d, MOD A4C: 72.3 ml LV vol s, MOD A2C: 22.0 ml LV vol s, MOD A4C: 19.5 ml LV SV MOD A2C:     33.8 ml LV SV MOD A4C:     72.3 ml LV SV MOD BP:      42.9 ml LEFT ATRIUM             Index LA diam:        3.80 cm 2.45 cm/m LA Vol (A2C):   40.3 ml 26.02 ml/m LA Vol (A4C):   22.7 ml 14.65 ml/m LA Biplane Vol: 30.6 ml 19.75 ml/m  AORTIC VALVE AV Area (Vmax):     1.87 cm AV Area (Vmean):   1.82 cm AV Area (VTI):     2.12 cm AV Vmax:           147.00 cm/s AV Vmean:          85.900 cm/s AV VTI:            0.290 m AV Peak Grad:      8.6 mmHg AV Mean Grad:      4.0 mmHg LVOT Vmax:         87.70 cm/s LVOT Vmean:        49.700 cm/s LVOT VTI:          0.196 m LVOT/AV VTI ratio: 0.68  AORTA Ao Asc diam: 3.80 cm MITRAL VALVE               TRICUSPID VALVE MV Area (PHT): 3.81 cm    TR Peak grad:   48.7 mmHg MV Decel Time: 199 msec  TR Vmax:        349.00 cm/s MV E velocity: 82.60 cm/s MV A velocity: 93.60 cm/s  SHUNTS MV E/A ratio:  0.88        Systemic VTI:  0.20 m                            Systemic Diam: 2.00 cm Luana Rumple MD Electronically signed by Luana Rumple MD Signature Date/Time: 06/02/2023/4:00:03 PM    Final    DG Chest Port 1 View Result Date: 05/30/2023 CLINICAL DATA:  Questionable sepsis - evaluate for abnormality. Dyspnea EXAM: PORTABLE CHEST 1 VIEW COMPARISON:  Chest x-ray 05/21/2023, CT cardiac 06/18/2010 FINDINGS: The heart and mediastinal contours are unchanged. Atherosclerotic plaque. Interval development of bilateral lower lobes, right greater than left interstitial and airspace opacities. Chronic coarsened interstitial markings. Bilateral trace pleural effusion. No pneumothorax. No acute osseous abnormality. IMPRESSION: 1. Interval development of bilateral lower lobes, right greater than left interstitial and airspace opacities. 2. Bilateral trace pleural effusion. 3. Aortic Atherosclerosis (ICD10-I70.0) and Emphysema (ICD10-J43.9). Electronically Signed   By: Morgane  Naveau M.D.   On: 05/30/2023 01:21   DG Chest 2 View Result Date: 05/21/2023 CLINICAL DATA:  SOB. EXAM: CHEST - 2 VIEW COMPARISON:  05/07/2022. FINDINGS: Bilateral lungs appear hyperexpanded and hyperlucent with coarse bronchovascular markings, in keeping with COPD. Bilateral lungs otherwise appear clear. No dense consolidation or lung collapse. Bilateral costophrenic angles are  clear. Stable cardio-mediastinal silhouette. No acute osseous abnormalities. The soft tissues are within normal limits. IMPRESSION: No active cardiopulmonary disease. COPD. Electronically Signed   By: Beula Brunswick M.D.   On: 05/21/2023 18:08      Subjective: Patient seen and examined at bedside.  Still feels weak and tired.  No fever, vomiting, chest pain reported.  Discharge Exam: Vitals:   06/17/23 0510 06/17/23 1113  BP: 126/62   Pulse: 67   Resp: 18   Temp: 97.7 F (36.5 C)   SpO2: 98% 97%    General: Pt is alert, awake, not in acute distress.  Looks chronically ill and deconditioned.  On 4 L oxygen  via nasal cannula. Cardiovascular: rate controlled, S1/S2 + Respiratory: bilateral decreased breath sounds at bases with scattered crackles Abdominal: Soft, NT, ND, bowel sounds + Extremities: Trace lower extremity edema; no cyanosis    The results of significant diagnostics from this hospitalization (including imaging, microbiology, ancillary and laboratory) are listed below for reference.     Microbiology: No results found for this or any previous visit (from the past 240 hours).   Labs: BNP (last 3 results) Recent Labs    05/31/23 1026 06/03/23 0416  BNP 834.8* 433.8*   Basic Metabolic Panel: Recent Labs  Lab 06/12/23 0411 06/15/23 0348 06/16/23 0411  NA 134* 134* 135  K 4.4 3.6 3.5  CL 92* 93* 95*  CO2 31 32 31  GLUCOSE 210* 142* 110*  BUN 45* 31* 29*  CREATININE 0.76 0.91 0.58  CALCIUM  8.3* 8.2* 8.2*  MG 2.5* 2.3 2.2  PHOS 3.8 2.5 3.0   Liver Function Tests: No results for input(s): "AST", "ALT", "ALKPHOS", "BILITOT", "PROT", "ALBUMIN" in the last 168 hours. No results for input(s): "LIPASE", "AMYLASE" in the last 168 hours. No results for input(s): "AMMONIA" in the last 168 hours. CBC: Recent Labs  Lab 06/12/23 0411 06/15/23 0348 06/16/23 0411  WBC 17.2* 18.1* 16.3*  HGB 13.4 12.5 12.3  HCT 43.1 40.1 38.3  MCV 92.1 91.3 88.5  PLT 214 238  259   Cardiac Enzymes: No results for input(s): "CKTOTAL", "CKMB", "CKMBINDEX", "TROPONINI" in the last 168 hours. BNP: Invalid input(s): "POCBNP" CBG: No results for input(s): "GLUCAP" in the last 168 hours. D-Dimer No results for input(s): "DDIMER" in the last 72 hours. Hgb A1c No results for input(s): "HGBA1C" in the last 72 hours. Lipid Profile No results for input(s): "CHOL", "HDL", "LDLCALC", "TRIG", "CHOLHDL", "LDLDIRECT" in the last 72 hours. Thyroid  function studies No results for input(s): "TSH", "T4TOTAL", "T3FREE", "THYROIDAB" in the last 72 hours.  Invalid input(s): "FREET3" Anemia work up No results for input(s): "VITAMINB12", "FOLATE", "FERRITIN", "TIBC", "IRON", "RETICCTPCT" in the last 72 hours. Urinalysis    Component Value Date/Time   COLORURINE YELLOW 05/30/2023 0042   APPEARANCEUR HAZY (A) 05/30/2023 0042   APPEARANCEUR Clear 11/23/2020 1121   LABSPEC 1.010 05/30/2023 0042   PHURINE 5.0 05/30/2023 0042   GLUCOSEU NEGATIVE 05/30/2023 0042   GLUCOSEU NEGATIVE 06/14/2015 1452   HGBUR SMALL (A) 05/30/2023 0042   BILIRUBINUR NEGATIVE 05/30/2023 0042   BILIRUBINUR Negative 09/11/2022 1404   BILIRUBINUR Negative 11/23/2020 1121   KETONESUR NEGATIVE 05/30/2023 0042   PROTEINUR NEGATIVE 05/30/2023 0042   UROBILINOGEN 1.0 09/11/2022 1404   UROBILINOGEN 0.2 06/14/2015 1452   NITRITE NEGATIVE 05/30/2023 0042   LEUKOCYTESUR SMALL (A) 05/30/2023 0042   Sepsis Labs Recent Labs  Lab 06/12/23 0411 06/15/23 0348 06/16/23 0411  WBC 17.2* 18.1* 16.3*   Microbiology No results found for this or any previous visit (from the past 240 hours).   Time coordinating discharge: 35 minutes  SIGNED:   Audria Leather, MD  [facility] 06/17/2023, 11:43 AM

## 2023-06-18 DIAGNOSIS — R300 Dysuria: Secondary | ICD-10-CM | POA: Diagnosis not present

## 2023-06-21 DIAGNOSIS — Z515 Encounter for palliative care: Secondary | ICD-10-CM | POA: Diagnosis not present

## 2023-06-22 DIAGNOSIS — G4700 Insomnia, unspecified: Secondary | ICD-10-CM | POA: Diagnosis not present

## 2023-06-22 DIAGNOSIS — J189 Pneumonia, unspecified organism: Secondary | ICD-10-CM | POA: Diagnosis not present

## 2023-06-22 DIAGNOSIS — J449 Chronic obstructive pulmonary disease, unspecified: Secondary | ICD-10-CM | POA: Diagnosis not present

## 2023-06-22 DIAGNOSIS — I10 Essential (primary) hypertension: Secondary | ICD-10-CM | POA: Diagnosis not present

## 2023-06-22 DIAGNOSIS — I480 Paroxysmal atrial fibrillation: Secondary | ICD-10-CM | POA: Diagnosis not present

## 2023-06-22 DIAGNOSIS — D649 Anemia, unspecified: Secondary | ICD-10-CM | POA: Diagnosis not present

## 2023-06-22 DIAGNOSIS — N189 Chronic kidney disease, unspecified: Secondary | ICD-10-CM | POA: Diagnosis not present

## 2023-06-22 DIAGNOSIS — I5032 Chronic diastolic (congestive) heart failure: Secondary | ICD-10-CM | POA: Diagnosis not present

## 2023-06-22 DIAGNOSIS — J9610 Chronic respiratory failure, unspecified whether with hypoxia or hypercapnia: Secondary | ICD-10-CM | POA: Diagnosis not present

## 2023-06-25 DIAGNOSIS — I5032 Chronic diastolic (congestive) heart failure: Secondary | ICD-10-CM | POA: Diagnosis not present

## 2023-06-25 DIAGNOSIS — J449 Chronic obstructive pulmonary disease, unspecified: Secondary | ICD-10-CM | POA: Diagnosis not present

## 2023-06-25 DIAGNOSIS — G4700 Insomnia, unspecified: Secondary | ICD-10-CM | POA: Diagnosis not present

## 2023-06-25 DIAGNOSIS — D649 Anemia, unspecified: Secondary | ICD-10-CM | POA: Diagnosis not present

## 2023-06-25 DIAGNOSIS — I480 Paroxysmal atrial fibrillation: Secondary | ICD-10-CM | POA: Diagnosis not present

## 2023-06-25 DIAGNOSIS — E119 Type 2 diabetes mellitus without complications: Secondary | ICD-10-CM | POA: Diagnosis not present

## 2023-06-25 DIAGNOSIS — J9610 Chronic respiratory failure, unspecified whether with hypoxia or hypercapnia: Secondary | ICD-10-CM | POA: Diagnosis not present

## 2023-06-25 DIAGNOSIS — J189 Pneumonia, unspecified organism: Secondary | ICD-10-CM | POA: Diagnosis not present

## 2023-06-25 DIAGNOSIS — I10 Essential (primary) hypertension: Secondary | ICD-10-CM | POA: Diagnosis not present

## 2023-07-02 ENCOUNTER — Ambulatory Visit: Attending: Physician Assistant | Admitting: Physician Assistant

## 2023-07-03 NOTE — Progress Notes (Signed)
 This encounter was created in error - please disregard.

## 2023-07-09 DIAGNOSIS — Z515 Encounter for palliative care: Secondary | ICD-10-CM | POA: Diagnosis not present

## 2023-07-12 DIAGNOSIS — E1122 Type 2 diabetes mellitus with diabetic chronic kidney disease: Secondary | ICD-10-CM | POA: Diagnosis not present

## 2023-07-12 DIAGNOSIS — I5032 Chronic diastolic (congestive) heart failure: Secondary | ICD-10-CM | POA: Diagnosis not present

## 2023-07-12 DIAGNOSIS — A419 Sepsis, unspecified organism: Secondary | ICD-10-CM | POA: Diagnosis not present

## 2023-07-12 DIAGNOSIS — J9621 Acute and chronic respiratory failure with hypoxia: Secondary | ICD-10-CM | POA: Diagnosis not present

## 2023-07-12 DIAGNOSIS — J440 Chronic obstructive pulmonary disease with acute lower respiratory infection: Secondary | ICD-10-CM | POA: Diagnosis not present

## 2023-07-12 DIAGNOSIS — N189 Chronic kidney disease, unspecified: Secondary | ICD-10-CM | POA: Diagnosis not present

## 2023-07-12 DIAGNOSIS — D631 Anemia in chronic kidney disease: Secondary | ICD-10-CM | POA: Diagnosis not present

## 2023-07-12 DIAGNOSIS — I130 Hypertensive heart and chronic kidney disease with heart failure and stage 1 through stage 4 chronic kidney disease, or unspecified chronic kidney disease: Secondary | ICD-10-CM | POA: Diagnosis not present

## 2023-07-12 DIAGNOSIS — J188 Other pneumonia, unspecified organism: Secondary | ICD-10-CM | POA: Diagnosis not present

## 2023-07-15 ENCOUNTER — Encounter (HOSPITAL_BASED_OUTPATIENT_CLINIC_OR_DEPARTMENT_OTHER): Payer: Self-pay | Admitting: Nurse Practitioner

## 2023-07-15 ENCOUNTER — Inpatient Hospital Stay (HOSPITAL_BASED_OUTPATIENT_CLINIC_OR_DEPARTMENT_OTHER): Admitting: Nurse Practitioner

## 2023-07-16 ENCOUNTER — Ambulatory Visit: Admitting: Family Medicine

## 2023-07-16 ENCOUNTER — Telehealth: Payer: Self-pay

## 2023-07-16 DIAGNOSIS — Z7409 Other reduced mobility: Secondary | ICD-10-CM

## 2023-07-16 DIAGNOSIS — J440 Chronic obstructive pulmonary disease with acute lower respiratory infection: Secondary | ICD-10-CM | POA: Diagnosis not present

## 2023-07-16 DIAGNOSIS — J9621 Acute and chronic respiratory failure with hypoxia: Secondary | ICD-10-CM | POA: Diagnosis not present

## 2023-07-16 DIAGNOSIS — R2681 Unsteadiness on feet: Secondary | ICD-10-CM

## 2023-07-16 DIAGNOSIS — I5032 Chronic diastolic (congestive) heart failure: Secondary | ICD-10-CM | POA: Diagnosis not present

## 2023-07-16 DIAGNOSIS — J188 Other pneumonia, unspecified organism: Secondary | ICD-10-CM | POA: Diagnosis not present

## 2023-07-16 DIAGNOSIS — N189 Chronic kidney disease, unspecified: Secondary | ICD-10-CM | POA: Diagnosis not present

## 2023-07-16 DIAGNOSIS — A419 Sepsis, unspecified organism: Secondary | ICD-10-CM | POA: Diagnosis not present

## 2023-07-16 DIAGNOSIS — I130 Hypertensive heart and chronic kidney disease with heart failure and stage 1 through stage 4 chronic kidney disease, or unspecified chronic kidney disease: Secondary | ICD-10-CM | POA: Diagnosis not present

## 2023-07-16 DIAGNOSIS — E1122 Type 2 diabetes mellitus with diabetic chronic kidney disease: Secondary | ICD-10-CM | POA: Diagnosis not present

## 2023-07-16 DIAGNOSIS — D631 Anemia in chronic kidney disease: Secondary | ICD-10-CM | POA: Diagnosis not present

## 2023-07-16 NOTE — Addendum Note (Signed)
 Addended by: Terris Fickle D on: 07/16/2023 10:19 AM   Modules accepted: Orders

## 2023-07-16 NOTE — Telephone Encounter (Signed)
 Please do prescription for manual wheelchair. Diagnosis is impaired mobility, unsteady gait.

## 2023-07-16 NOTE — Telephone Encounter (Signed)
 Copied from CRM [id number]. Topic: General - Other >> Jul 16, 2023  9:06 AM Priscilla Cantrell wrote: Reason for CRM: patient husband is requesting a wheelchair for the patient stated patient is not able to walk far and is getting short of breath when she is out patient husband would like a call back regarding this.  Pt's next appt is 6/18

## 2023-07-16 NOTE — Telephone Encounter (Signed)
 DME order completed, patient is will be coming to pick up written rx

## 2023-07-17 DIAGNOSIS — N189 Chronic kidney disease, unspecified: Secondary | ICD-10-CM | POA: Diagnosis not present

## 2023-07-17 DIAGNOSIS — J188 Other pneumonia, unspecified organism: Secondary | ICD-10-CM | POA: Diagnosis not present

## 2023-07-17 DIAGNOSIS — D631 Anemia in chronic kidney disease: Secondary | ICD-10-CM | POA: Diagnosis not present

## 2023-07-17 DIAGNOSIS — J440 Chronic obstructive pulmonary disease with acute lower respiratory infection: Secondary | ICD-10-CM | POA: Diagnosis not present

## 2023-07-17 DIAGNOSIS — A419 Sepsis, unspecified organism: Secondary | ICD-10-CM | POA: Diagnosis not present

## 2023-07-17 DIAGNOSIS — I5032 Chronic diastolic (congestive) heart failure: Secondary | ICD-10-CM | POA: Diagnosis not present

## 2023-07-17 DIAGNOSIS — E1122 Type 2 diabetes mellitus with diabetic chronic kidney disease: Secondary | ICD-10-CM | POA: Diagnosis not present

## 2023-07-17 DIAGNOSIS — I130 Hypertensive heart and chronic kidney disease with heart failure and stage 1 through stage 4 chronic kidney disease, or unspecified chronic kidney disease: Secondary | ICD-10-CM | POA: Diagnosis not present

## 2023-07-17 DIAGNOSIS — J9621 Acute and chronic respiratory failure with hypoxia: Secondary | ICD-10-CM | POA: Diagnosis not present

## 2023-07-18 ENCOUNTER — Other Ambulatory Visit: Payer: Self-pay | Admitting: Family Medicine

## 2023-07-20 ENCOUNTER — Telehealth: Payer: Self-pay

## 2023-07-20 DIAGNOSIS — J188 Other pneumonia, unspecified organism: Secondary | ICD-10-CM | POA: Diagnosis not present

## 2023-07-20 DIAGNOSIS — A419 Sepsis, unspecified organism: Secondary | ICD-10-CM | POA: Diagnosis not present

## 2023-07-20 DIAGNOSIS — J9621 Acute and chronic respiratory failure with hypoxia: Secondary | ICD-10-CM | POA: Diagnosis not present

## 2023-07-20 DIAGNOSIS — J440 Chronic obstructive pulmonary disease with acute lower respiratory infection: Secondary | ICD-10-CM | POA: Diagnosis not present

## 2023-07-20 DIAGNOSIS — F02A4 Dementia in other diseases classified elsewhere, mild, with anxiety: Secondary | ICD-10-CM

## 2023-07-20 DIAGNOSIS — N189 Chronic kidney disease, unspecified: Secondary | ICD-10-CM | POA: Diagnosis not present

## 2023-07-20 DIAGNOSIS — E039 Hypothyroidism, unspecified: Secondary | ICD-10-CM

## 2023-07-20 DIAGNOSIS — I130 Hypertensive heart and chronic kidney disease with heart failure and stage 1 through stage 4 chronic kidney disease, or unspecified chronic kidney disease: Secondary | ICD-10-CM | POA: Diagnosis not present

## 2023-07-20 DIAGNOSIS — E1122 Type 2 diabetes mellitus with diabetic chronic kidney disease: Secondary | ICD-10-CM | POA: Diagnosis not present

## 2023-07-20 DIAGNOSIS — D631 Anemia in chronic kidney disease: Secondary | ICD-10-CM | POA: Diagnosis not present

## 2023-07-20 DIAGNOSIS — I5032 Chronic diastolic (congestive) heart failure: Secondary | ICD-10-CM | POA: Diagnosis not present

## 2023-07-20 DIAGNOSIS — F02A3 Dementia in other diseases classified elsewhere, mild, with mood disturbance: Secondary | ICD-10-CM

## 2023-07-20 NOTE — Telephone Encounter (Signed)
 Copied from CRM [id number]. Topic: Medical Record Request - Provider/Facility Request >> Jul 20, 2023  2:55 PM Antonieta Kitten wrote: Reason for CRM: Catitlyn from [facility] calling to see if faxed forms have been received. Order# on the forms are [id number] and [id number]. Any questions please give her a call at [phone]   Confirmed with Catitlyn we received faxed orders

## 2023-07-22 ENCOUNTER — Ambulatory Visit (INDEPENDENT_AMBULATORY_CARE_PROVIDER_SITE_OTHER): Admitting: Family Medicine

## 2023-07-22 ENCOUNTER — Encounter: Payer: Self-pay | Admitting: Family Medicine

## 2023-07-22 VITALS — BP 149/68 | HR 64 | Ht 62.0 in | Wt 109.2 lb

## 2023-07-22 DIAGNOSIS — I5032 Chronic diastolic (congestive) heart failure: Secondary | ICD-10-CM | POA: Diagnosis not present

## 2023-07-22 DIAGNOSIS — R5381 Other malaise: Secondary | ICD-10-CM

## 2023-07-22 DIAGNOSIS — J9621 Acute and chronic respiratory failure with hypoxia: Secondary | ICD-10-CM | POA: Diagnosis not present

## 2023-07-22 DIAGNOSIS — D631 Anemia in chronic kidney disease: Secondary | ICD-10-CM | POA: Diagnosis not present

## 2023-07-22 DIAGNOSIS — R739 Hyperglycemia, unspecified: Secondary | ICD-10-CM

## 2023-07-22 DIAGNOSIS — E876 Hypokalemia: Secondary | ICD-10-CM | POA: Diagnosis not present

## 2023-07-22 DIAGNOSIS — E039 Hypothyroidism, unspecified: Secondary | ICD-10-CM | POA: Diagnosis not present

## 2023-07-22 DIAGNOSIS — J9611 Chronic respiratory failure with hypoxia: Secondary | ICD-10-CM

## 2023-07-22 DIAGNOSIS — E1122 Type 2 diabetes mellitus with diabetic chronic kidney disease: Secondary | ICD-10-CM | POA: Diagnosis not present

## 2023-07-22 DIAGNOSIS — A419 Sepsis, unspecified organism: Secondary | ICD-10-CM | POA: Diagnosis not present

## 2023-07-22 DIAGNOSIS — I480 Paroxysmal atrial fibrillation: Secondary | ICD-10-CM | POA: Diagnosis not present

## 2023-07-22 DIAGNOSIS — J449 Chronic obstructive pulmonary disease, unspecified: Secondary | ICD-10-CM | POA: Diagnosis not present

## 2023-07-22 DIAGNOSIS — N189 Chronic kidney disease, unspecified: Secondary | ICD-10-CM | POA: Diagnosis not present

## 2023-07-22 DIAGNOSIS — J188 Other pneumonia, unspecified organism: Secondary | ICD-10-CM | POA: Diagnosis not present

## 2023-07-22 DIAGNOSIS — J440 Chronic obstructive pulmonary disease with acute lower respiratory infection: Secondary | ICD-10-CM | POA: Diagnosis not present

## 2023-07-22 DIAGNOSIS — I130 Hypertensive heart and chronic kidney disease with heart failure and stage 1 through stage 4 chronic kidney disease, or unspecified chronic kidney disease: Secondary | ICD-10-CM | POA: Diagnosis not present

## 2023-07-22 LAB — POCT GLYCOSYLATED HEMOGLOBIN (HGB A1C)
HbA1c POC (<> result, manual entry): 5.5 % (ref 4.0–5.6)
HbA1c, POC (controlled diabetic range): 5.5 % (ref 0.0–7.0)
HbA1c, POC (prediabetic range): 5.5 % — AB (ref 5.7–6.4)
Hemoglobin A1C: 5.5 % (ref 4.0–5.6)

## 2023-07-22 MED ORDER — APIXABAN 2.5 MG PO TABS: 2.5000 mg | ORAL_TABLET | Freq: Two times a day (BID) | ORAL | 3 refills | Status: AC

## 2023-07-22 MED ORDER — FUROSEMIDE 20 MG PO TABS: ORAL_TABLET | ORAL | Status: AC

## 2023-07-22 NOTE — Patient Instructions (Signed)
 Stop metformin (glucophage).

## 2023-07-22 NOTE — Progress Notes (Signed)
 OFFICE VISIT  07/22/2023  CC:  Chief Complaint  Patient presents with   Medical Management of Chronic Issues    Patient is a 88 y.o. female who presents accompanied by her boyfriend Royston Cornea for follow-up chronic hypoxic respiratory failure, COPD, hypertension, and unsteady gait.  INTERIM HX: She was admitted to the hospital in April this year for acute on chronic hypoxic respiratory failure that was felt to be due to a combination of COPD/CAP and acute diastolic heart failure. She was started on Lasix  and potassium supplement. She had new onset of A-fib with rapid ventricular response.  She was started on Eliquis  2.5 mg twice daily (CHA2DS2-VASc score 7).   Records states that her TSH was a little bit low and free T4 was a little bit high so it says her thyroid  dosing was adjusted.  She was on 100 mcg a day on admission.  She shows me her pill pack today and it has 125 mcg levothyroxine  tabs and states take 1/day.  I do not know if there was a dosing change made in her rehab facility or not.  She went to rehab at [facility] after the hospitalization and got out 6 days ago. She was discharged home on 3 L of supplemental oxygen , which is the same as she was on before admission. She is weak and she does need assistance to stand but can use a rolling walker of approximately 20 to 30 feet before having to stop to rest.  Home health PT supposed to start this week.  Her boyfriend Royston Cornea stopped giving her Lasix  (she was discharged home on 60 mg a day) fairly recently because he felt like she was getting dehydrated.  She has not had any lower extremity swelling. Fairly well and drink fluids well--sips of tea throughout her day.  She is currently on metformin.  This was not on her hospital discharge medication list.  She must of been put on this in rehab. Her glucoses were elevated in the hospital, suspect this was due to high-dose steroids.  She currently feels like her breathing is okay,  baseline. She has not had any chest pain, fever, productive cough, rash, abdominal pain, nausea or vomiting or diarrhea, or focal weakness.  Past Medical History:  Diagnosis Date   Allergic rhinitis    with upper airway cough: as of 07/2017 pulm f/u she was instructed to use astelin , flonase , and saline more consistently   BPPV (benign paroxysmal positional vertigo) 03/2018   C. difficile colitis    Chest pain, non-cardiac    Cardiac CT showed no signif obstructive dz, mildly elevated calcium  level (Dr. Audery Blazing).   Chronic cystitis with hematuria    Dr. Lyndol Santee   Chronic diastolic heart failure (HCC)    Chronic hypoxemic respiratory failure (HCC) 02/02/2015   2L oxygen  24/7   Chronic renal insufficiency, stage 2 (mild)    GFR approx 60   Cold hands    NCS/EMGs normal.  Hand dysesthesias per neuro--reassured   COPD (chronic obstructive pulmonary disease) (HCC)    GOLD 4.  spirometry 01/10/09 FEV 0.97(52%), FEV1% 47.  With chronic bronchitis as of 07/2017.   DDD (degenerative disc disease), cervical    1998 MRI C5-6 impingemt (left).  MRI 2021 (neurol) 1 level canal stenosis, 1 level foraminal stenosis--->PT   DDD (degenerative disc disease), lumbar    Depression with anxiety 04/15/2011   Diarrheal disease Summer 2017   ? refractor C diff vs post infectious diarrhea predominant syndrome--GI told her to  avoid lactose, sorbitol, and caffeine (08/30/15--Dr. Dr Andriette Keeling).  As of 10/05/15 pt reports GI dx'd her with C diff and rx'd more flagyl  and she is also on cholestyramine .  As of 02/2016, pt's sx's resolved completely.   Diverticulosis of colon    Procto '97 and colonoscopy 2002   Dysplastic nevus of upper extremity 04/2014   R tricep (Dr. Steen Eden)   Edema of both lower extremities due to peripheral venous insufficiency    Varicosities bilat, swelling L>R.  R baker's cyst.  Got vein clinic eval 02/2018->sclerotherapy recommended but since no hemorrhage or ulceration, insurance will  not cover the procedure.   Family history of adverse reaction to anesthesia    mother died when patient age 81 receiving anesthesia for thyroid  surgery   Fibrocystic breast disease    w/fibroadenoma.  Bx's showed NO atypia (Dr. Linell Rhymes).  Mammo neg 2008.   GERD (gastroesophageal reflux disease)    History of double vision    Ophthalmologist, Dr. Lasandra Points, is further evaluating this with MRI  orbits and limited brain (myesthenia gravis testing neg 07/2010)   History of home oxygen  therapy    uses 2 liters at hs   Hypertension    EKG 03/2010 normal   Hypothyroidism    Hashimoto's, dx'd 1989   Idiopathic peripheral neuropathy 04/2018   Sensory (numb feet) S1 distribution bilat, c/w spinal stenosis sensory neuropathy (no pain). 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  Labs [place], MRI C spine->some spondylosis/stenosis.  UE NCS/EMS: normal 04/2019.   Lesion of right native kidney 2017   found on CT 02/2015.  F/u MRI 03/31/15 showed it to be smaller and less likely of any significance but repeat CT renal protocol in 72mo was recommended by radiology.  This was done 10/26/15 and showed no interval change in the lesion, but b/c the lesion enhances with contrast, radiol rec'd urol consult (b/c pap renal RCC could not be excluded).  Saw urol 03/06/16--stable on u/s 09/2016 and 09/2017.   OAB (overactive bladder)    Osteoarthritis, hip, bilateral    she is s/p bilat THA as of 02/2018.   Osteoporosis    radius, T score -2.5->rec'd alendronate  08/2019->plan rpt DEXA 08/2021.   PAF (paroxysmal atrial fibrillation) (HCC)    Pneumonia    Postmenopausal atrophic vaginitis    improved with estrace 2 X/week.   Prolapse of vaginal cuff after hysterectomy    10/2018: Dr. Zackary Heron. Mild colpocele 04/2019 per GYN (no surgery)   Pulmonary nodule    CT chest 12/10, June 2011.  No nodule on CT 06/2010.   Recurrent Clostridium difficile diarrhea    Episode 09/19/2016:  resolved with prolonged course of flagyl .  11/2016 recurrence treated with 10d of Dificid (Fidaxomicin) by GI (Dr. Andriette Keeling).   Recurrent UTI    likely related to pt's atrophic vaginitis.  Urol started vaginal estrace 03/2016.   Rib fracture 09/2018   R 7th, laterally-->sustained when her dog pulled her over and she fell.   RLS (restless legs syndrome)    neuro->gabapentin  trial 2020/21   Shingles 02/2014   R abd/flank   Spinal stenosis of lumbar region with neurogenic claudication    2008 MRI (L3-4, L4-5, L5-S1).  +S1 distribution sensory loss bilat    Past Surgical History:  Procedure Laterality Date   ABDOMINAL HYSTERECTOMY  1978   no BSO per pt--nonmalignant reasons   APPENDECTOMY     BREAST CYST EXCISION     Last screening mammogram 10/2010 was  normal.   CHOLECYSTECTOMY  2001   COLONOSCOPY  04/2005; 12/06/2015   2007 NORMAL.  2017 adenomatous polyp x 1.  Mod sigmoid diverticulosis (Dr. Andriette Keeling).     DEXA  05/18/2017; 08/7019   2019 NORMAL (T-score 0.1).  08/2019->T score -2.5->alendronate  started.  03/2022 T score -2.7   HIP CLOSED REDUCTION Right 12/25/2015   Procedure: CLOSED REDUCTION HIP;  Surgeon: Janeth Medicus, MD;  Location: [facility];  Service: Orthopedics;  Laterality: Right;   NCV/EMS  10/2018   chronic and symmetric sensorimotor axonal polyneuropathy affecting the lower extremities   TONSILLECTOMY     TOTAL HIP ARTHROPLASTY Right 11/01/2014   Procedure: RIGHT TOTAL HIP ARTHROPLASTY;  Surgeon: Hazle Lites, MD;  Location: [facility];  Service: Orthopedics;  Laterality: Right;   TOTAL HIP ARTHROPLASTY Left 12/08/2017   Procedure: LEFT TOTAL HIP ARTHROPLASTY ANTERIOR APPROACH;  Surgeon: Claiborne Crew, MD;  Location: [facility];  Service: Orthopedics;  Laterality: Left;  70 mins   TOTAL HIP REVISION Right 02/10/2023   Procedure: RIGHT TOTAL HIP REVISION;  Surgeon: Claiborne Crew, MD;  Location: [facility];  Service: Orthopedics;  Laterality: Right;   TRANSTHORACIC ECHOCARDIOGRAM   08/2014   2016 EF 60-65%, grade I diast dysfxn. 06/21/21 unchanged.  05/2023 EF 60-65%, grd I DD, sev elev pulm art pressure, severe tricusp regurg   TUBAL LIGATION  1974    Outpatient Medications Prior to Visit  Medication Sig Dispense Refill   acetaminophen  (TYLENOL ) 325 MG tablet Take 2 tablets (650 mg total) by mouth every 6 (six) hours.     albuterol  (PROAIR  HFA) 108 (90 Base) MCG/ACT inhaler Inhale 2 puffs into the lungs every 4 (four) hours as needed for wheezing or shortness of breath. 17 each 1   albuterol  (PROVENTIL ) (2.5 MG/3ML) 0.083% nebulizer solution Take 2.5 mg by nebulization every 6 (six) hours as needed for wheezing or shortness of breath.     diltiazem  (CARDIZEM  CD) 240 MG 24 hr capsule Take 1 capsule (240 mg total) by mouth daily. 30 capsule 0   fluticasone  (FLONASE ) 50 MCG/ACT nasal spray Place 2 sprays into both nostrils daily. 16 g 6   Fluticasone -Umeclidin-Vilant (TRELEGY ELLIPTA ) 100-62.5-25 MCG/ACT AEPB Inhale 1 puff into the lungs daily. 180 each 3   guaiFENesin  (MUCINEX ) 600 MG 12 hr tablet Take 2 tablets (1,200 mg total) by mouth 2 (two) times daily. 30 tablet 0   levothyroxine  (SYNTHROID ) 100 MCG tablet TAKE 1 TABLET BY MOUTH EVERY DAY 30 tablet 0   metoprolol  succinate (TOPROL -XL) 25 MG 24 hr tablet Take 1 tablet (25 mg total) by mouth daily. 30 tablet 0   Misc. Devices (PULSE OXIMETER) MISC Check oxygen  level as needed 1 each 1   pantoprazole  (PROTONIX ) 40 MG tablet Take 1 tablet (40 mg total) by mouth 2 (two) times daily. 180 tablet 1   potassium chloride  SA (KLOR-CON  M) 20 MEQ tablet Take 1 tablet (20 mEq total) by mouth daily. 30 tablet 0   venlafaxine  (EFFEXOR ) 50 MG tablet Take 50 mg by mouth 2 (two) times daily.     apixaban  (ELIQUIS ) 2.5 MG TABS tablet Take 1 tablet (2.5 mg total) by mouth 2 (two) times daily. 60 tablet 0   metFORMIN (GLUCOPHAGE) 500 MG tablet Take 500 mg by mouth 2 (two) times daily.     furosemide  (LASIX ) 20 MG tablet Take 3 tablets (60  mg total) by mouth daily. (Patient not taking: Reported on 07/22/2023) 90 tablet 0   lidocaine  (XYLOCAINE ) 2 % solution Apply  1/2 ml to affected area of mouth 3 times per day as needed for pain 20 mL 2   polyethylene glycol (MIRALAX  / GLYCOLAX ) 17 g packet Take 17 g by mouth daily as needed. 14 each 0   predniSONE  (DELTASONE ) 10 MG tablet 3 tabs po every day x 2d, then 2 tabs po every day x 4d, then 1 tab po every day x 4d then stop 18 tablet 0   senna-docusate (SENOKOT-S) 8.6-50 MG tablet Take 2 tablets by mouth 2 (two) times daily. 30 tablet 0   No facility-administered medications prior to visit.    Allergies  Allergen Reactions   Losartan  Other (See Comments)    hyperkalemia   Penicillins Hives, Swelling and Rash    Has patient had a PCN reaction causing immediate rash, facial/tongue/throat swelling, SOB or lightheadedness with hypotension: YES Has patient had a PCN reaction causing severe rash involving mucus membranes or skin necrosis: NO Has patient had a PCN reaction that required hospitalization NO Has patient had a PCN reaction occurring within the last 10 years: NO If all of the above answers are NO, then may proceed with Cephalosporin use.    Cefixime [Kdc:Cefixime] Other (See Comments)    unspecified   Gabapentin  Other (See Comments)    Feels woozy   Indocin [Indomethacin] Other (See Comments)    Painful tongue and throat   Singulair  [Montelukast  Sodium]     Chest tightness, decreased mental clarity   Sulfa Antibiotics Other (See Comments)    unspecified   Ace Inhibitors Other (See Comments)    cough   Statins Other (See Comments)    Myalgias     Review of Systems As per HPI  PE:    07/22/2023    1:50 PM 07/22/2023    1:35 PM 06/17/2023    1:01 PM  Vitals with BMI  Height  5' 2   Weight  109 lbs 3 oz   BMI  19.97   Systolic 149 151 324  Diastolic 68 66 54  Pulse  64 74     Physical Exam  General: Alert and well-appearing.  Sitting in wheelchair  with nasal cannula on. Pleasant affect.  Lucid thought and speech. ENT: PERRL.  Extraocular movements intact.  Oropharynx moist and pink. Neck without lymphadenopathy or thyromegaly. Lungs are clear bilaterally, aeration slightly diminished on exhalation diffusely.  Exhalation phase is not significantly prolonged.  Breathing is nonlabored. Abdomen is soft and nontender and nondistended. Extremities: No cyanosis or edema.  LABS:  Last CBC Lab Results  Component Value Date   WBC 16.3 (H) 06/16/2023   HGB 12.3 06/16/2023   HCT 38.3 06/16/2023   MCV 88.5 06/16/2023   MCH 28.4 06/16/2023   RDW 12.8 06/16/2023   PLT 259 06/16/2023   Last metabolic panel Lab Results  Component Value Date   GLUCOSE 110 (H) 06/16/2023   NA 135 06/16/2023   K 3.5 06/16/2023   CL 95 (L) 06/16/2023   CO2 31 06/16/2023   BUN 29 (H) 06/16/2023   CREATININE 0.58 06/16/2023   GFRNONAA >60 06/16/2023   CALCIUM  8.2 (L) 06/16/2023   PHOS 3.0 06/16/2023   PROT 5.8 (L) 06/05/2023   ALBUMIN 2.7 (L) 06/05/2023   BILITOT 1.0 06/05/2023   ALKPHOS 64 06/05/2023   AST 22 06/05/2023   ALT 28 06/05/2023   ANIONGAP 9 06/16/2023   Last lipids Lab Results  Component Value Date   CHOL 198 12/07/2020   HDL 66 12/07/2020   LDLCALC  113 (H) 12/07/2020   LDLDIRECT 131.3 03/07/2010   TRIG 88 12/07/2020   CHOLHDL 3.0 12/07/2020   Last hemoglobin A1c Lab Results  Component Value Date   HGBA1C 5.5 07/22/2023   HGBA1C 5.5 07/22/2023   HGBA1C 5.5 (A) 07/22/2023   HGBA1C 5.5 07/22/2023   Last thyroid  functions Lab Results  Component Value Date   TSH 0.313 (L) 05/31/2023   T3TOTAL 90 12/25/2022   T4TOTAL 12.4 07/11/2010   Last vitamin D  Lab Results  Component Value Date   VD25OH 28.09 (L) 05/07/2022   Last vitamin B12 and Folate Lab Results  Component Value Date   VITAMINB12 528 09/11/2022   FOLATE 15.8 10/12/2018   IMPRESSION AND PLAN:  #1 chronic hypoxic respiratory failure. She is recovered from  her recent acute exacerbation. She will continue Trelegy Ellipta  1 puff daily and albuterol  every 6 hours as needed. Continue 3 L of oxygen  nasal cannula 24/7.  #2 chronic diastolic heart failure with pulmonary hypertension. She is on Toprol -XL 25 mg a day and was on Lasix  60 mg a day.  No Lasix  for at least the last 5 or 6 days due to fear of dehydration.  No swelling at this time.  Current weight is 109 pounds (her weight prior to admission was 118 pounds). Will hold off on restarting Lasix .  Check basic metabolic panel today.  3.  Paroxysmal atrial fibrillation. Rapid ventricular response while in the hospital. Rate controlled on Toprol -XL 25 mg a day and Cardizem  CD 240 mg a day. She is on Eliquis  2.5 mg twice daily. Discussed potential adverse effects of Eliquis .  Reviewed signs/symptoms of bleeding.  Recommended strict fall precautions.  4.  Hyperglycemia. Suspect all due to high-dose steroids in the hospital. Hemoglobin A1c here today is 5.5%. Will see what her glucose is on her metabolic panel today. Will discontinue metformin today.  5.  Hypothyroidism. TSH 0.3 upon admission, T4 at upper limit of normal at that time (end of April). It is not clear whether a dose adjustment was made in the hospital.  She was on 100 mcg a day prior to admission. Somehow, her current dosing in her pill pack is 125 mcg every day (?  Dose change while in rehab?). Monitor TSH today.  #6 chronic debilitation. She will be getting home health PT and OT.  Currently she is wheelchair-bound for the most part. Monitor albumin.  An After Visit Summary was printed and given to the patient.  FOLLOW UP: Return in about 2 weeks (around 08/05/2023) for routine chronic illness f/u.  Signed:  Arletha Lady, MD           07/22/2023

## 2023-07-23 DIAGNOSIS — I130 Hypertensive heart and chronic kidney disease with heart failure and stage 1 through stage 4 chronic kidney disease, or unspecified chronic kidney disease: Secondary | ICD-10-CM | POA: Diagnosis not present

## 2023-07-23 DIAGNOSIS — D631 Anemia in chronic kidney disease: Secondary | ICD-10-CM | POA: Diagnosis not present

## 2023-07-23 DIAGNOSIS — I5032 Chronic diastolic (congestive) heart failure: Secondary | ICD-10-CM | POA: Diagnosis not present

## 2023-07-23 DIAGNOSIS — J9621 Acute and chronic respiratory failure with hypoxia: Secondary | ICD-10-CM | POA: Diagnosis not present

## 2023-07-23 DIAGNOSIS — J188 Other pneumonia, unspecified organism: Secondary | ICD-10-CM | POA: Diagnosis not present

## 2023-07-23 DIAGNOSIS — A419 Sepsis, unspecified organism: Secondary | ICD-10-CM | POA: Diagnosis not present

## 2023-07-23 DIAGNOSIS — J440 Chronic obstructive pulmonary disease with acute lower respiratory infection: Secondary | ICD-10-CM | POA: Diagnosis not present

## 2023-07-23 DIAGNOSIS — E1122 Type 2 diabetes mellitus with diabetic chronic kidney disease: Secondary | ICD-10-CM | POA: Diagnosis not present

## 2023-07-23 DIAGNOSIS — N189 Chronic kidney disease, unspecified: Secondary | ICD-10-CM | POA: Diagnosis not present

## 2023-07-23 LAB — COMPREHENSIVE METABOLIC PANEL WITH GFR
ALT: 9 U/L (ref 0–35)
AST: 13 U/L (ref 0–37)
Albumin: 3.8 g/dL (ref 3.5–5.2)
Alkaline Phosphatase: 80 U/L (ref 39–117)
BUN: 13 mg/dL (ref 6–23)
CO2: 30 meq/L (ref 19–32)
Calcium: 8.8 mg/dL (ref 8.4–10.5)
Chloride: 104 meq/L (ref 96–112)
Creatinine, Ser: 0.71 mg/dL (ref 0.40–1.20)
GFR: 77.1 mL/min (ref >60.00)
Glucose, Bld: 81 mg/dL (ref 70–99)
Potassium: 3.8 meq/L (ref 3.5–5.1)
Sodium: 142 meq/L (ref 135–145)
Total Bilirubin: 0.5 mg/dL (ref 0.2–1.2)
Total Protein: 6 g/dL (ref 6.0–8.3)

## 2023-07-23 LAB — TSH: TSH: 0.14 u[IU]/mL — ABNORMAL LOW (ref 0.35–5.50)

## 2023-07-24 ENCOUNTER — Ambulatory Visit: Payer: Self-pay | Admitting: Family Medicine

## 2023-07-24 DIAGNOSIS — E039 Hypothyroidism, unspecified: Secondary | ICD-10-CM

## 2023-07-24 MED ORDER — LEVOTHYROXINE SODIUM 100 MCG PO TABS: 100.0000 ug | ORAL_TABLET | Freq: Every day | ORAL | 1 refills | Status: AC

## 2023-07-29 DIAGNOSIS — J9621 Acute and chronic respiratory failure with hypoxia: Secondary | ICD-10-CM | POA: Diagnosis not present

## 2023-07-29 DIAGNOSIS — D631 Anemia in chronic kidney disease: Secondary | ICD-10-CM | POA: Diagnosis not present

## 2023-07-29 DIAGNOSIS — N189 Chronic kidney disease, unspecified: Secondary | ICD-10-CM | POA: Diagnosis not present

## 2023-07-29 DIAGNOSIS — A419 Sepsis, unspecified organism: Secondary | ICD-10-CM | POA: Diagnosis not present

## 2023-07-29 DIAGNOSIS — E1122 Type 2 diabetes mellitus with diabetic chronic kidney disease: Secondary | ICD-10-CM | POA: Diagnosis not present

## 2023-07-29 DIAGNOSIS — J188 Other pneumonia, unspecified organism: Secondary | ICD-10-CM | POA: Diagnosis not present

## 2023-07-29 DIAGNOSIS — I5032 Chronic diastolic (congestive) heart failure: Secondary | ICD-10-CM | POA: Diagnosis not present

## 2023-07-29 DIAGNOSIS — I130 Hypertensive heart and chronic kidney disease with heart failure and stage 1 through stage 4 chronic kidney disease, or unspecified chronic kidney disease: Secondary | ICD-10-CM | POA: Diagnosis not present

## 2023-07-29 DIAGNOSIS — J440 Chronic obstructive pulmonary disease with acute lower respiratory infection: Secondary | ICD-10-CM | POA: Diagnosis not present

## 2023-07-31 DIAGNOSIS — J440 Chronic obstructive pulmonary disease with acute lower respiratory infection: Secondary | ICD-10-CM | POA: Diagnosis not present

## 2023-07-31 DIAGNOSIS — J188 Other pneumonia, unspecified organism: Secondary | ICD-10-CM | POA: Diagnosis not present

## 2023-07-31 DIAGNOSIS — I130 Hypertensive heart and chronic kidney disease with heart failure and stage 1 through stage 4 chronic kidney disease, or unspecified chronic kidney disease: Secondary | ICD-10-CM | POA: Diagnosis not present

## 2023-07-31 DIAGNOSIS — D631 Anemia in chronic kidney disease: Secondary | ICD-10-CM | POA: Diagnosis not present

## 2023-07-31 DIAGNOSIS — A419 Sepsis, unspecified organism: Secondary | ICD-10-CM | POA: Diagnosis not present

## 2023-07-31 DIAGNOSIS — I5032 Chronic diastolic (congestive) heart failure: Secondary | ICD-10-CM | POA: Diagnosis not present

## 2023-07-31 DIAGNOSIS — E1122 Type 2 diabetes mellitus with diabetic chronic kidney disease: Secondary | ICD-10-CM | POA: Diagnosis not present

## 2023-07-31 DIAGNOSIS — J9621 Acute and chronic respiratory failure with hypoxia: Secondary | ICD-10-CM | POA: Diagnosis not present

## 2023-07-31 DIAGNOSIS — N189 Chronic kidney disease, unspecified: Secondary | ICD-10-CM | POA: Diagnosis not present

## 2023-08-04 ENCOUNTER — Telehealth: Payer: Self-pay

## 2023-08-04 DIAGNOSIS — J188 Other pneumonia, unspecified organism: Secondary | ICD-10-CM | POA: Diagnosis not present

## 2023-08-04 DIAGNOSIS — N189 Chronic kidney disease, unspecified: Secondary | ICD-10-CM | POA: Diagnosis not present

## 2023-08-04 DIAGNOSIS — A419 Sepsis, unspecified organism: Secondary | ICD-10-CM | POA: Diagnosis not present

## 2023-08-04 DIAGNOSIS — I5032 Chronic diastolic (congestive) heart failure: Secondary | ICD-10-CM | POA: Diagnosis not present

## 2023-08-04 DIAGNOSIS — J440 Chronic obstructive pulmonary disease with acute lower respiratory infection: Secondary | ICD-10-CM | POA: Diagnosis not present

## 2023-08-04 DIAGNOSIS — D631 Anemia in chronic kidney disease: Secondary | ICD-10-CM | POA: Diagnosis not present

## 2023-08-04 DIAGNOSIS — I130 Hypertensive heart and chronic kidney disease with heart failure and stage 1 through stage 4 chronic kidney disease, or unspecified chronic kidney disease: Secondary | ICD-10-CM | POA: Diagnosis not present

## 2023-08-04 DIAGNOSIS — E1122 Type 2 diabetes mellitus with diabetic chronic kidney disease: Secondary | ICD-10-CM | POA: Diagnosis not present

## 2023-08-04 DIAGNOSIS — J9621 Acute and chronic respiratory failure with hypoxia: Secondary | ICD-10-CM | POA: Diagnosis not present

## 2023-08-04 NOTE — Telephone Encounter (Signed)
 Home health orders received 08/04/23 for [facility] health initiation orders: Yes.  Home health re-certification orders: No. Patient last seen by ordering physician for this condition: 07/22/23. Must be less than 90 days for re-certification and less than 30 days prior for initiation. Visit must have been for the condition the orders are being placed.  Patient meets criteria for Physician to sign orders: Yes.        Current med list has been attached: Yes        Orders placed on physicians desk for signature: 08/04/23 (date) If patient does not meet criteria for orders to be signed: pt was called to schedule appt. Appt is scheduled for 08/06/23.   Placed on PCP desk to review and sign, if appropriate.   Nola Botkins D Lovely Kerins

## 2023-08-06 ENCOUNTER — Ambulatory Visit (INDEPENDENT_AMBULATORY_CARE_PROVIDER_SITE_OTHER): Admitting: Family Medicine

## 2023-08-06 ENCOUNTER — Encounter: Payer: Self-pay | Admitting: Family Medicine

## 2023-08-06 VITALS — BP 106/56 | HR 88 | Temp 98.2°F | Ht 62.0 in | Wt 106.4 lb

## 2023-08-06 DIAGNOSIS — J441 Chronic obstructive pulmonary disease with (acute) exacerbation: Secondary | ICD-10-CM

## 2023-08-06 DIAGNOSIS — I4891 Unspecified atrial fibrillation: Secondary | ICD-10-CM | POA: Diagnosis not present

## 2023-08-06 DIAGNOSIS — E039 Hypothyroidism, unspecified: Secondary | ICD-10-CM

## 2023-08-06 DIAGNOSIS — J9611 Chronic respiratory failure with hypoxia: Secondary | ICD-10-CM

## 2023-08-06 DIAGNOSIS — I5032 Chronic diastolic (congestive) heart failure: Secondary | ICD-10-CM

## 2023-08-06 NOTE — Progress Notes (Signed)
 OFFICE VISIT  09/05/2023  CC:  Chief Complaint  Patient presents with   Medical Management of Chronic Issues    Patient is a 88 y.o. female who presents accompanied by her boyfriend [name] for 2-week follow-up COPD, chronic diastolic heart failure with pulmonary hypertension, A-fib, and chronic debilitation.  INTERIM HX: TSH low last visit, decreased levothyroxine  to 100 mcg Once a day.  She is at her baseline level of dyspnea on exertion. Chronic 3 L O2 use. No recent cough or wheeze. She is not requiring any Lasix . Appetite is down some.  No abdominal pain.  No nausea, vomiting, or diarrhea.  No blood in stool, no black stool. She ambulates in her home without a cane or walker.  She has a little bit of doldrums some days but denies significant or prolonged depressed mood. Forgetfulness/short-term memory continues to gradually get worse.  Denies palpitations or any feeling of racing heart.  No dizziness.  No chest pain.  No lower extremity swelling.  Past Medical History:  Diagnosis Date   Allergic rhinitis    with upper airway cough: as of 07/2017 pulm f/u she was instructed to use astelin , flonase , and saline more consistently   BPPV (benign paroxysmal positional vertigo) 03/2018   C. difficile colitis    Chest pain, non-cardiac    Cardiac CT showed no signif obstructive dz, mildly elevated calcium  level (Dr. Pietro).   Chronic cystitis with hematuria    Dr. Roman   Chronic diastolic heart failure (HCC)    Chronic hypoxemic respiratory failure (HCC) 02/02/2015   2L oxygen  24/7   Chronic renal insufficiency, stage 2 (mild)    GFR approx 60   Cold hands    NCS/EMGs normal.  Hand dysesthesias per neuro--reassured   COPD (chronic obstructive pulmonary disease) (HCC)    GOLD 4.  spirometry 01/10/09 FEV 0.97(52%), FEV1% 47.  With chronic bronchitis as of 07/2017.   DDD (degenerative disc disease), cervical    1998 MRI C5-6 impingemt (left).  MRI 2021 (neurol) 1  level canal stenosis, 1 level foraminal stenosis--->PT   DDD (degenerative disc disease), lumbar    Depression with anxiety 04/15/2011   Diarrheal disease Summer 2017   ? 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 Dr Luis).  As of 10/05/15 pt reports GI dx'd her with C diff and rx'd more flagyl  and she is also on cholestyramine .  As of 02/2016, pt's sx's resolved completely.   Diverticulosis of colon    Procto '97 and colonoscopy 2002   Dysplastic nevus of upper extremity 04/2014   R tricep (Dr. Livingston)   Edema of both lower extremities due to peripheral venous insufficiency    Varicosities bilat, swelling L>R.  R baker's cyst.  Got vein clinic eval 02/2018->sclerotherapy recommended but since no hemorrhage or ulceration, insurance will not cover the procedure.   Family history of adverse reaction to anesthesia    mother died when patient age 26 receiving anesthesia for thyroid  surgery   Fibrocystic breast disease    w/fibroadenoma.  Bx's showed NO atypia (Dr. Merrilyn).  Mammo neg 2008.   GERD (gastroesophageal reflux disease)    History of double vision    Ophthalmologist, Dr. Cleatus, is further evaluating this with MRI  orbits and limited brain (myesthenia gravis testing neg 07/2010)   History of home oxygen  therapy    uses 2 liters at hs   Hypertension    EKG 03/2010 normal  Hypothyroidism    Hashimoto's, dx'd 1989   Idiopathic peripheral neuropathy 04/2018   Sensory (numb feet) S1 distribution bilat, c/w spinal stenosis sensory neuropathy (no pain). 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  Labs Canalou, MRI C spine->some spondylosis/stenosis.  UE NCS/EMS: normal 04/2019.   Lesion of right native kidney 2017   found on CT 02/2015.  F/u MRI 03/31/15 showed it to be smaller and less likely of any significance but repeat CT renal protocol in 68mo  was recommended by radiology.  This was done 10/26/15 and showed no interval change in the lesion, but b/c the lesion enhances with contrast, radiol rec'd urol consult (b/c pap renal RCC could not be excluded).  Saw urol 03/06/16--stable on u/s 09/2016 and 09/2017.   OAB (overactive bladder)    Osteoarthritis, hip, bilateral    she is s/p bilat THA as of 02/2018.   Osteoporosis    radius, T score -2.5->rec'd alendronate  08/2019->plan rpt DEXA 08/2021.   PAF (paroxysmal atrial fibrillation) (HCC)    Pneumonia    Postmenopausal atrophic vaginitis    improved with estrace 2 X/week.   Prolapse of vaginal cuff after hysterectomy    10/2018: Dr. Louvenia. Mild colpocele 04/2019 per GYN (no surgery)   Pulmonary nodule    CT chest 12/10, June 2011.  No nodule on CT 06/2010.   Recurrent Clostridium difficile diarrhea    Episode 09/19/2016: resolved with prolonged course of flagyl .  11/2016 recurrence treated with 10d of Dificid (Fidaxomicin) by GI (Dr. Luis).   Recurrent UTI    likely related to pt's atrophic vaginitis.  Urol started vaginal estrace 03/2016.   Rib fracture 09/2018   R 7th, laterally-->sustained when her dog pulled her over and she fell.   RLS (restless legs syndrome)    neuro->gabapentin  trial 2020/21   Shingles 02/2014   R abd/flank   Spinal stenosis of lumbar region with neurogenic claudication    2008 MRI (L3-4, L4-5, L5-S1).  +S1 distribution sensory loss bilat    Past Surgical History:  Procedure Laterality Date   ABDOMINAL HYSTERECTOMY  1978   no BSO per pt--nonmalignant reasons   APPENDECTOMY     BREAST CYST EXCISION     Last screening mammogram 10/2010 was normal.   CHOLECYSTECTOMY  2001   COLONOSCOPY  04/2005; 12/06/2015   2007 NORMAL.  2017 adenomatous polyp x 1.  Mod sigmoid diverticulosis (Dr. Luis).     DEXA  05/18/2017; 08/7019   2019 NORMAL (T-score 0.1).  08/2019->T score -2.5->alendronate  started.  03/2022 T score -2.7   HIP CLOSED REDUCTION Right 12/25/2015    Procedure: CLOSED REDUCTION HIP;  Surgeon: Selinda Belvie Gosling, MD;  Location: [facility];  Service: Orthopedics;  Laterality: Right;   NCV/EMS  10/2018   chronic and symmetric sensorimotor axonal polyneuropathy affecting the lower extremities   TONSILLECTOMY     TOTAL HIP ARTHROPLASTY Right 11/01/2014   Procedure: RIGHT TOTAL HIP ARTHROPLASTY;  Surgeon: Tanda Heading, MD;  Location: [facility];  Service: Orthopedics;  Laterality: Right;   TOTAL HIP ARTHROPLASTY Left 12/08/2017   Procedure: LEFT TOTAL HIP ARTHROPLASTY ANTERIOR APPROACH;  Surgeon: Ernie Cough, MD;  Location: [facility];  Service: Orthopedics;  Laterality: Left;  70 mins   TOTAL HIP REVISION Right 02/10/2023   Procedure: RIGHT TOTAL HIP REVISION;  Surgeon: Ernie Cough, MD;  Location: [facility];  Service: Orthopedics;  Laterality: Right;   TRANSTHORACIC ECHOCARDIOGRAM  08/2014   2016 EF 60-65%, grade I diast dysfxn. 06/21/21  unchanged.  05/2023 EF 60-65%, grd I DD, sev elev pulm art pressure, severe tricusp regurg   TUBAL LIGATION  1974    Outpatient Medications Prior to Visit  Medication Sig Dispense Refill   acetaminophen  (TYLENOL ) 325 MG tablet Take 2 tablets (650 mg total) by mouth every 6 (six) hours.     albuterol  (PROVENTIL ) (2.5 MG/3ML) 0.083% nebulizer solution Take 2.5 mg by nebulization every 6 (six) hours as needed for wheezing or shortness of breath.     apixaban  (ELIQUIS ) 2.5 MG TABS tablet Take 1 tablet (2.5 mg total) by mouth 2 (two) times daily. 180 tablet 3   diltiazem  (CARDIZEM  CD) 240 MG 24 hr capsule Take 1 capsule (240 mg total) by mouth daily. 30 capsule 0   fluticasone  (FLONASE ) 50 MCG/ACT nasal spray Place 2 sprays into both nostrils daily. 16 g 6   Fluticasone -Umeclidin-Vilant (TRELEGY ELLIPTA ) 100-62.5-25 MCG/ACT AEPB Inhale 1 puff into the lungs daily. 180 each 3   furosemide  (LASIX ) 20 MG tablet 1-3 tabs po every day as needed for legs swelling     guaiFENesin  (MUCINEX ) 600 MG 12 hr tablet Take 2 tablets (1,200  mg total) by mouth 2 (two) times daily. 30 tablet 0   levothyroxine  (SYNTHROID ) 100 MCG tablet Take 1 tablet (100 mcg total) by mouth daily. 30 tablet 1   metoprolol  succinate (TOPROL -XL) 25 MG 24 hr tablet Take 1 tablet (25 mg total) by mouth daily. 30 tablet 0   Misc. Devices (PULSE OXIMETER) MISC Check oxygen  level as needed 1 each 1   pantoprazole  (PROTONIX ) 40 MG tablet Take 1 tablet (40 mg total) by mouth 2 (two) times daily. 180 tablet 1   potassium chloride  SA (KLOR-CON  M) 20 MEQ tablet Take 1 tablet (20 mEq total) by mouth daily. 30 tablet 0   venlafaxine  (EFFEXOR ) 50 MG tablet Take 50 mg by mouth 2 (two) times daily.     albuterol  (PROAIR  HFA) 108 (90 Base) MCG/ACT inhaler Inhale 2 puffs into the lungs every 4 (four) hours as needed for wheezing or shortness of breath. 17 each 1   levothyroxine  (SYNTHROID ) 100 MCG tablet TAKE 1 TABLET BY MOUTH EVERY DAY 30 tablet 0   No facility-administered medications prior to visit.    Allergies  Allergen Reactions   Losartan  Other (See Comments)    hyperkalemia   Penicillins Hives, Swelling and Rash    Has patient had a PCN reaction causing immediate rash, facial/tongue/throat swelling, SOB or lightheadedness with hypotension: YES Has patient had a PCN reaction causing severe rash involving mucus membranes or skin necrosis: NO Has patient had a PCN reaction that required hospitalization NO Has patient had a PCN reaction occurring within the last 10 years: NO If all of the above answers are NO, then may proceed with Cephalosporin use.    Cefixime [Kdc:Cefixime] Other (See Comments)    unspecified   Gabapentin  Other (See Comments)    Feels woozy   Indocin [Indomethacin] Other (See Comments)    Painful tongue and throat   Singulair  [Montelukast  Sodium]     Chest tightness, decreased mental clarity   Sulfa Antibiotics Other (See Comments)    unspecified   Ace Inhibitors Other (See Comments)    cough   Statins Other (See Comments)     Myalgias     Review of Systems As per HPI  PE:    08/06/2023    1:10 PM 07/22/2023    1:50 PM 07/22/2023    1:35 PM  Vitals with  BMI  Height 5' 2  5' 2  Weight 106 lbs 6 oz  109 lbs 3 oz  BMI 19.46  19.97  Systolic 106 149 848  Diastolic 56 68 66  Pulse 88  64   02 sat 91% on 3L oxygen  today  Physical Exam  Gen: Alert, well appearing.  Patient is oriented to person, place, time, and situation. AFFECT: pleasant, lucid thought and speech. Cardiovascular: Irregularly irregular, rate approximately 85-90, no murmur. Lungs are clear to auscultation bilaterally, nonlabored respirations.  Expiratory phase is not prolonged. Extremities: No cyanosis or edema.  LABS:  Last CBC Lab Results  Component Value Date   WBC 16.3 (H) 06/16/2023   HGB 12.3 06/16/2023   HCT 38.3 06/16/2023   MCV 88.5 06/16/2023   MCH 28.4 06/16/2023   RDW 12.8 06/16/2023   PLT 259 06/16/2023   Last metabolic panel Lab Results  Component Value Date   GLUCOSE 81 07/22/2023   NA 142 07/22/2023   K 3.8 07/22/2023   CL 104 07/22/2023   CO2 30 07/22/2023   BUN 13 07/22/2023   CREATININE 0.71 07/22/2023   GFR 77.10 07/22/2023   CALCIUM  8.8 07/22/2023   PHOS 3.0 06/16/2023   PROT 6.0 07/22/2023   ALBUMIN 3.8 07/22/2023   BILITOT 0.5 07/22/2023   ALKPHOS 80 07/22/2023   AST 13 07/22/2023   ALT 9 07/22/2023   ANIONGAP 9 06/16/2023   Last lipids Lab Results  Component Value Date   CHOL 198 12/07/2020   HDL 66 12/07/2020   LDLCALC 113 (H) 12/07/2020   LDLDIRECT 131.3 03/07/2010   TRIG 88 12/07/2020   CHOLHDL 3.0 12/07/2020   Last hemoglobin A1c Lab Results  Component Value Date   HGBA1C 5.5 07/22/2023   HGBA1C 5.5 07/22/2023   HGBA1C 5.5 (A) 07/22/2023   HGBA1C 5.5 07/22/2023   Last thyroid  functions Lab Results  Component Value Date   TSH 0.14 (L) 07/22/2023   T3TOTAL 90 12/25/2022   T4TOTAL 12.4 07/11/2010   Last vitamin D  Lab Results  Component Value Date   VD25OH 28.09 (L)  05/07/2022   Last vitamin B12 and Folate Lab Results  Component Value Date   VITAMINB12 528 09/11/2022   FOLATE 15.8 10/12/2018   IMPRESSION AND PLAN:  #1 chronic hypoxemic respiratory failure due to COPD, recent exacerbation resolved. She requests a referral to pulmonology to get established with Dr. Jude.  #2 chronic diastolic heart failure with pulmonary hypertension. Continue Toprol -XL 25 mg a day and Lasix  20 mg on a as needed basis.  3.  Acquired hypothyroidism. TSH low 2 weeks ago.  Dose adjusted at that time. Plan repeat TSH 4 weeks.  #4 A-fib.  Rate controlled with Cardizem  CD 240 mg a day and Toprol -XL 25 mg a day. Continue Eliquis  2.5 mg twice daily for stroke prophylaxis.  #5 debilitated patient with memory impairment. Stable. HH PT.  An After Visit Summary was printed and given to the patient.  FOLLOW UP: Return in about 4 weeks (around 09/03/2023) for routine chronic illness f/u.  Signed:  Gerlene Hockey, MD           09/05/2023

## 2023-08-06 NOTE — Patient Instructions (Signed)
 Priscilla Cantrell

## 2023-08-11 DIAGNOSIS — E1122 Type 2 diabetes mellitus with diabetic chronic kidney disease: Secondary | ICD-10-CM | POA: Diagnosis not present

## 2023-08-11 DIAGNOSIS — I5032 Chronic diastolic (congestive) heart failure: Secondary | ICD-10-CM | POA: Diagnosis not present

## 2023-08-11 DIAGNOSIS — J188 Other pneumonia, unspecified organism: Secondary | ICD-10-CM | POA: Diagnosis not present

## 2023-08-11 DIAGNOSIS — J9621 Acute and chronic respiratory failure with hypoxia: Secondary | ICD-10-CM | POA: Diagnosis not present

## 2023-08-11 DIAGNOSIS — D631 Anemia in chronic kidney disease: Secondary | ICD-10-CM | POA: Diagnosis not present

## 2023-08-11 DIAGNOSIS — J440 Chronic obstructive pulmonary disease with acute lower respiratory infection: Secondary | ICD-10-CM | POA: Diagnosis not present

## 2023-08-11 DIAGNOSIS — N189 Chronic kidney disease, unspecified: Secondary | ICD-10-CM | POA: Diagnosis not present

## 2023-08-11 DIAGNOSIS — I130 Hypertensive heart and chronic kidney disease with heart failure and stage 1 through stage 4 chronic kidney disease, or unspecified chronic kidney disease: Secondary | ICD-10-CM | POA: Diagnosis not present

## 2023-08-11 DIAGNOSIS — A419 Sepsis, unspecified organism: Secondary | ICD-10-CM | POA: Diagnosis not present

## 2023-08-13 DIAGNOSIS — N189 Chronic kidney disease, unspecified: Secondary | ICD-10-CM | POA: Diagnosis not present

## 2023-08-13 DIAGNOSIS — I130 Hypertensive heart and chronic kidney disease with heart failure and stage 1 through stage 4 chronic kidney disease, or unspecified chronic kidney disease: Secondary | ICD-10-CM | POA: Diagnosis not present

## 2023-08-13 DIAGNOSIS — J188 Other pneumonia, unspecified organism: Secondary | ICD-10-CM | POA: Diagnosis not present

## 2023-08-13 DIAGNOSIS — I5032 Chronic diastolic (congestive) heart failure: Secondary | ICD-10-CM | POA: Diagnosis not present

## 2023-08-13 DIAGNOSIS — D631 Anemia in chronic kidney disease: Secondary | ICD-10-CM | POA: Diagnosis not present

## 2023-08-13 DIAGNOSIS — J440 Chronic obstructive pulmonary disease with acute lower respiratory infection: Secondary | ICD-10-CM | POA: Diagnosis not present

## 2023-08-13 DIAGNOSIS — J9621 Acute and chronic respiratory failure with hypoxia: Secondary | ICD-10-CM | POA: Diagnosis not present

## 2023-08-13 DIAGNOSIS — E1122 Type 2 diabetes mellitus with diabetic chronic kidney disease: Secondary | ICD-10-CM | POA: Diagnosis not present

## 2023-08-13 DIAGNOSIS — A419 Sepsis, unspecified organism: Secondary | ICD-10-CM | POA: Diagnosis not present

## 2023-08-14 ENCOUNTER — Telehealth: Payer: Self-pay

## 2023-08-14 DIAGNOSIS — I5032 Chronic diastolic (congestive) heart failure: Secondary | ICD-10-CM | POA: Diagnosis not present

## 2023-08-14 DIAGNOSIS — J9621 Acute and chronic respiratory failure with hypoxia: Secondary | ICD-10-CM | POA: Diagnosis not present

## 2023-08-14 DIAGNOSIS — N189 Chronic kidney disease, unspecified: Secondary | ICD-10-CM | POA: Diagnosis not present

## 2023-08-14 DIAGNOSIS — J440 Chronic obstructive pulmonary disease with acute lower respiratory infection: Secondary | ICD-10-CM | POA: Diagnosis not present

## 2023-08-14 DIAGNOSIS — I130 Hypertensive heart and chronic kidney disease with heart failure and stage 1 through stage 4 chronic kidney disease, or unspecified chronic kidney disease: Secondary | ICD-10-CM | POA: Diagnosis not present

## 2023-08-14 DIAGNOSIS — E1122 Type 2 diabetes mellitus with diabetic chronic kidney disease: Secondary | ICD-10-CM | POA: Diagnosis not present

## 2023-08-14 DIAGNOSIS — A419 Sepsis, unspecified organism: Secondary | ICD-10-CM | POA: Diagnosis not present

## 2023-08-14 DIAGNOSIS — D631 Anemia in chronic kidney disease: Secondary | ICD-10-CM | POA: Diagnosis not present

## 2023-08-14 DIAGNOSIS — J188 Other pneumonia, unspecified organism: Secondary | ICD-10-CM | POA: Diagnosis not present

## 2023-08-14 NOTE — Telephone Encounter (Signed)
 Copied from CRM [id number]. Topic: General - Other >> Aug 14, 2023  2:19 PM Aisha D wrote: Reason for CRM: Lindajo with [facility] home health wanted to inform Dr.McGowen that the pt's significant other stated that she almost collapse yesterday due to her forgetting to take her oxygen . Lindajo stated that she checked the pt's vital signs today and they are stable.  PCP verbally made aware.

## 2023-08-14 NOTE — Telephone Encounter (Signed)
 Home health orders received 08/14/23 for Priscilla Cantrell health initiation orders: Yes.  Home health re-certification orders: No. Patient last seen by ordering physician for this condition: 08/06/23. Must be less than 90 days for re-certification and less than 30 days prior for initiation. Visit must have been for the condition the orders are being placed.  Patient meets criteria for Physician to sign orders: Yes.        Current med list has been attached: Yes        Orders placed on physicians desk for signature: 08/14/23 (date) If patient does not meet criteria for orders to be signed: pt was called to schedule appt. Appt is scheduled for 08/06/23.    Placed on PCP desk to review and sign, if appropriate.

## 2023-08-18 ENCOUNTER — Telehealth: Payer: Self-pay

## 2023-08-18 DIAGNOSIS — I130 Hypertensive heart and chronic kidney disease with heart failure and stage 1 through stage 4 chronic kidney disease, or unspecified chronic kidney disease: Secondary | ICD-10-CM | POA: Diagnosis not present

## 2023-08-18 DIAGNOSIS — J188 Other pneumonia, unspecified organism: Secondary | ICD-10-CM | POA: Diagnosis not present

## 2023-08-18 DIAGNOSIS — I5032 Chronic diastolic (congestive) heart failure: Secondary | ICD-10-CM | POA: Diagnosis not present

## 2023-08-18 DIAGNOSIS — D631 Anemia in chronic kidney disease: Secondary | ICD-10-CM | POA: Diagnosis not present

## 2023-08-18 DIAGNOSIS — J9621 Acute and chronic respiratory failure with hypoxia: Secondary | ICD-10-CM | POA: Diagnosis not present

## 2023-08-18 DIAGNOSIS — A419 Sepsis, unspecified organism: Secondary | ICD-10-CM | POA: Diagnosis not present

## 2023-08-18 DIAGNOSIS — J440 Chronic obstructive pulmonary disease with acute lower respiratory infection: Secondary | ICD-10-CM | POA: Diagnosis not present

## 2023-08-18 DIAGNOSIS — N189 Chronic kidney disease, unspecified: Secondary | ICD-10-CM | POA: Diagnosis not present

## 2023-08-18 DIAGNOSIS — E1122 Type 2 diabetes mellitus with diabetic chronic kidney disease: Secondary | ICD-10-CM | POA: Diagnosis not present

## 2023-08-18 NOTE — Telephone Encounter (Signed)
 Communication  Reason for CRM: Bari, PT assistant with [facility], called in to relay an episode that the patient had on July 11th. Patient almost passed out as she was without supplemental oxygen  for 30 minutes, husband noticed and was able to give patient oxygen  and she recovered just fine. Bari saw patient today 7/15 and there are no new concerns, she just wanted to relay episode to PCP. Callback number for Bari is [phone] with any further questions.    FYI.

## 2023-08-20 DIAGNOSIS — D631 Anemia in chronic kidney disease: Secondary | ICD-10-CM | POA: Diagnosis not present

## 2023-08-20 DIAGNOSIS — J9621 Acute and chronic respiratory failure with hypoxia: Secondary | ICD-10-CM | POA: Diagnosis not present

## 2023-08-20 DIAGNOSIS — I130 Hypertensive heart and chronic kidney disease with heart failure and stage 1 through stage 4 chronic kidney disease, or unspecified chronic kidney disease: Secondary | ICD-10-CM | POA: Diagnosis not present

## 2023-08-20 DIAGNOSIS — I5032 Chronic diastolic (congestive) heart failure: Secondary | ICD-10-CM | POA: Diagnosis not present

## 2023-08-20 DIAGNOSIS — A419 Sepsis, unspecified organism: Secondary | ICD-10-CM | POA: Diagnosis not present

## 2023-08-20 DIAGNOSIS — J188 Other pneumonia, unspecified organism: Secondary | ICD-10-CM | POA: Diagnosis not present

## 2023-08-20 DIAGNOSIS — E1122 Type 2 diabetes mellitus with diabetic chronic kidney disease: Secondary | ICD-10-CM | POA: Diagnosis not present

## 2023-08-20 DIAGNOSIS — J440 Chronic obstructive pulmonary disease with acute lower respiratory infection: Secondary | ICD-10-CM | POA: Diagnosis not present

## 2023-08-20 DIAGNOSIS — N189 Chronic kidney disease, unspecified: Secondary | ICD-10-CM | POA: Diagnosis not present

## 2023-08-21 ENCOUNTER — Telehealth: Payer: Self-pay

## 2023-08-21 ENCOUNTER — Other Ambulatory Visit: Payer: Self-pay | Admitting: Family Medicine

## 2023-08-21 ENCOUNTER — Other Ambulatory Visit: Payer: Self-pay | Admitting: Pulmonary Disease

## 2023-08-21 ENCOUNTER — Other Ambulatory Visit: Payer: Self-pay

## 2023-08-21 DIAGNOSIS — E1122 Type 2 diabetes mellitus with diabetic chronic kidney disease: Secondary | ICD-10-CM | POA: Diagnosis not present

## 2023-08-21 DIAGNOSIS — I130 Hypertensive heart and chronic kidney disease with heart failure and stage 1 through stage 4 chronic kidney disease, or unspecified chronic kidney disease: Secondary | ICD-10-CM | POA: Diagnosis not present

## 2023-08-21 DIAGNOSIS — J188 Other pneumonia, unspecified organism: Secondary | ICD-10-CM | POA: Diagnosis not present

## 2023-08-21 DIAGNOSIS — D631 Anemia in chronic kidney disease: Secondary | ICD-10-CM | POA: Diagnosis not present

## 2023-08-21 DIAGNOSIS — J440 Chronic obstructive pulmonary disease with acute lower respiratory infection: Secondary | ICD-10-CM | POA: Diagnosis not present

## 2023-08-21 DIAGNOSIS — A419 Sepsis, unspecified organism: Secondary | ICD-10-CM | POA: Diagnosis not present

## 2023-08-21 DIAGNOSIS — J9621 Acute and chronic respiratory failure with hypoxia: Secondary | ICD-10-CM | POA: Diagnosis not present

## 2023-08-21 DIAGNOSIS — I5032 Chronic diastolic (congestive) heart failure: Secondary | ICD-10-CM | POA: Diagnosis not present

## 2023-08-21 DIAGNOSIS — N189 Chronic kidney disease, unspecified: Secondary | ICD-10-CM | POA: Diagnosis not present

## 2023-08-21 NOTE — Telephone Encounter (Signed)
 [facility] faxed 1 form to be sign by Dr. McGowen.  Order [phone]

## 2023-08-21 NOTE — Telephone Encounter (Signed)
 Home health orders received 08/21/23 for Priscilla Cantrell health initiation orders: Yes.  Home health re-certification orders: No. Patient last seen by ordering physician for this condition: 08/06/23. Must be less than 90 days for re-certification and less than 30 days prior for initiation. Visit must have been for the condition the orders are being placed.  Patient meets criteria for Physician to sign orders: Yes.        Current med list has been attached: No        Orders placed on physicians desk for signature: 08/21/23 (date) If patient does not meet criteria for orders to be signed: pt was called to schedule appt. Appt is scheduled for 09/04/23.   Placed on PCP desk to review and sign, if appropriate.   Haniyah Maciolek D Chihiro Frey

## 2023-08-25 DIAGNOSIS — D631 Anemia in chronic kidney disease: Secondary | ICD-10-CM | POA: Diagnosis not present

## 2023-08-25 DIAGNOSIS — E1122 Type 2 diabetes mellitus with diabetic chronic kidney disease: Secondary | ICD-10-CM | POA: Diagnosis not present

## 2023-08-25 DIAGNOSIS — J440 Chronic obstructive pulmonary disease with acute lower respiratory infection: Secondary | ICD-10-CM | POA: Diagnosis not present

## 2023-08-25 DIAGNOSIS — N189 Chronic kidney disease, unspecified: Secondary | ICD-10-CM | POA: Diagnosis not present

## 2023-08-25 DIAGNOSIS — J9621 Acute and chronic respiratory failure with hypoxia: Secondary | ICD-10-CM | POA: Diagnosis not present

## 2023-08-25 DIAGNOSIS — A419 Sepsis, unspecified organism: Secondary | ICD-10-CM | POA: Diagnosis not present

## 2023-08-25 DIAGNOSIS — I5032 Chronic diastolic (congestive) heart failure: Secondary | ICD-10-CM | POA: Diagnosis not present

## 2023-08-25 DIAGNOSIS — J188 Other pneumonia, unspecified organism: Secondary | ICD-10-CM | POA: Diagnosis not present

## 2023-08-25 DIAGNOSIS — I130 Hypertensive heart and chronic kidney disease with heart failure and stage 1 through stage 4 chronic kidney disease, or unspecified chronic kidney disease: Secondary | ICD-10-CM | POA: Diagnosis not present

## 2023-08-27 ENCOUNTER — Telehealth: Payer: Self-pay | Admitting: Family Medicine

## 2023-08-27 DIAGNOSIS — N189 Chronic kidney disease, unspecified: Secondary | ICD-10-CM | POA: Diagnosis not present

## 2023-08-27 DIAGNOSIS — I130 Hypertensive heart and chronic kidney disease with heart failure and stage 1 through stage 4 chronic kidney disease, or unspecified chronic kidney disease: Secondary | ICD-10-CM | POA: Diagnosis not present

## 2023-08-27 DIAGNOSIS — D631 Anemia in chronic kidney disease: Secondary | ICD-10-CM | POA: Diagnosis not present

## 2023-08-27 DIAGNOSIS — I5032 Chronic diastolic (congestive) heart failure: Secondary | ICD-10-CM | POA: Diagnosis not present

## 2023-08-27 DIAGNOSIS — E1122 Type 2 diabetes mellitus with diabetic chronic kidney disease: Secondary | ICD-10-CM | POA: Diagnosis not present

## 2023-08-27 DIAGNOSIS — A419 Sepsis, unspecified organism: Secondary | ICD-10-CM | POA: Diagnosis not present

## 2023-08-27 DIAGNOSIS — J440 Chronic obstructive pulmonary disease with acute lower respiratory infection: Secondary | ICD-10-CM | POA: Diagnosis not present

## 2023-08-27 DIAGNOSIS — J9621 Acute and chronic respiratory failure with hypoxia: Secondary | ICD-10-CM | POA: Diagnosis not present

## 2023-08-27 DIAGNOSIS — J188 Other pneumonia, unspecified organism: Secondary | ICD-10-CM | POA: Diagnosis not present

## 2023-08-27 NOTE — Telephone Encounter (Signed)
 Copied from CRM [id number]. Topic: Clinical - Medical Advice >> Aug 27, 2023 10:45 AM Gennette ORN wrote: Reason for CRM: Melissa from [facility] is calling she wanting to know what to do about the 10 lb weight gain the patient has gained. Her callback number is [phone].

## 2023-08-27 NOTE — Telephone Encounter (Signed)
 Spoke with Melissa regarding weight gain, she recently spoke with patient's boyfriend [name] and their home scale is 10 pounds off, inaccurate weight given to Melissa.  No further action needed from us  at this time.

## 2023-08-31 DIAGNOSIS — A419 Sepsis, unspecified organism: Secondary | ICD-10-CM | POA: Diagnosis not present

## 2023-08-31 DIAGNOSIS — I130 Hypertensive heart and chronic kidney disease with heart failure and stage 1 through stage 4 chronic kidney disease, or unspecified chronic kidney disease: Secondary | ICD-10-CM | POA: Diagnosis not present

## 2023-08-31 DIAGNOSIS — J9621 Acute and chronic respiratory failure with hypoxia: Secondary | ICD-10-CM | POA: Diagnosis not present

## 2023-08-31 DIAGNOSIS — E1122 Type 2 diabetes mellitus with diabetic chronic kidney disease: Secondary | ICD-10-CM | POA: Diagnosis not present

## 2023-08-31 DIAGNOSIS — J440 Chronic obstructive pulmonary disease with acute lower respiratory infection: Secondary | ICD-10-CM | POA: Diagnosis not present

## 2023-08-31 DIAGNOSIS — D631 Anemia in chronic kidney disease: Secondary | ICD-10-CM | POA: Diagnosis not present

## 2023-08-31 DIAGNOSIS — J188 Other pneumonia, unspecified organism: Secondary | ICD-10-CM | POA: Diagnosis not present

## 2023-08-31 DIAGNOSIS — I5032 Chronic diastolic (congestive) heart failure: Secondary | ICD-10-CM | POA: Diagnosis not present

## 2023-08-31 DIAGNOSIS — N189 Chronic kidney disease, unspecified: Secondary | ICD-10-CM | POA: Diagnosis not present

## 2023-09-01 DIAGNOSIS — N189 Chronic kidney disease, unspecified: Secondary | ICD-10-CM | POA: Diagnosis not present

## 2023-09-01 DIAGNOSIS — J440 Chronic obstructive pulmonary disease with acute lower respiratory infection: Secondary | ICD-10-CM | POA: Diagnosis not present

## 2023-09-01 DIAGNOSIS — J188 Other pneumonia, unspecified organism: Secondary | ICD-10-CM | POA: Diagnosis not present

## 2023-09-01 DIAGNOSIS — J9621 Acute and chronic respiratory failure with hypoxia: Secondary | ICD-10-CM | POA: Diagnosis not present

## 2023-09-01 DIAGNOSIS — E1122 Type 2 diabetes mellitus with diabetic chronic kidney disease: Secondary | ICD-10-CM | POA: Diagnosis not present

## 2023-09-01 DIAGNOSIS — D631 Anemia in chronic kidney disease: Secondary | ICD-10-CM | POA: Diagnosis not present

## 2023-09-01 DIAGNOSIS — I5032 Chronic diastolic (congestive) heart failure: Secondary | ICD-10-CM | POA: Diagnosis not present

## 2023-09-01 DIAGNOSIS — I130 Hypertensive heart and chronic kidney disease with heart failure and stage 1 through stage 4 chronic kidney disease, or unspecified chronic kidney disease: Secondary | ICD-10-CM | POA: Diagnosis not present

## 2023-09-01 DIAGNOSIS — A419 Sepsis, unspecified organism: Secondary | ICD-10-CM | POA: Diagnosis not present

## 2023-09-02 DIAGNOSIS — J9621 Acute and chronic respiratory failure with hypoxia: Secondary | ICD-10-CM | POA: Diagnosis not present

## 2023-09-02 DIAGNOSIS — I5032 Chronic diastolic (congestive) heart failure: Secondary | ICD-10-CM | POA: Diagnosis not present

## 2023-09-02 DIAGNOSIS — J440 Chronic obstructive pulmonary disease with acute lower respiratory infection: Secondary | ICD-10-CM | POA: Diagnosis not present

## 2023-09-02 DIAGNOSIS — I130 Hypertensive heart and chronic kidney disease with heart failure and stage 1 through stage 4 chronic kidney disease, or unspecified chronic kidney disease: Secondary | ICD-10-CM | POA: Diagnosis not present

## 2023-09-02 DIAGNOSIS — D631 Anemia in chronic kidney disease: Secondary | ICD-10-CM | POA: Diagnosis not present

## 2023-09-02 DIAGNOSIS — E1122 Type 2 diabetes mellitus with diabetic chronic kidney disease: Secondary | ICD-10-CM | POA: Diagnosis not present

## 2023-09-02 DIAGNOSIS — N189 Chronic kidney disease, unspecified: Secondary | ICD-10-CM | POA: Diagnosis not present

## 2023-09-02 DIAGNOSIS — J188 Other pneumonia, unspecified organism: Secondary | ICD-10-CM | POA: Diagnosis not present

## 2023-09-02 DIAGNOSIS — A419 Sepsis, unspecified organism: Secondary | ICD-10-CM | POA: Diagnosis not present

## 2023-09-04 ENCOUNTER — Other Ambulatory Visit

## 2023-09-04 ENCOUNTER — Ambulatory Visit (INDEPENDENT_AMBULATORY_CARE_PROVIDER_SITE_OTHER): Admitting: Family Medicine

## 2023-09-04 DIAGNOSIS — E039 Hypothyroidism, unspecified: Secondary | ICD-10-CM

## 2023-09-04 NOTE — Progress Notes (Signed)
 No show

## 2023-09-07 DIAGNOSIS — A419 Sepsis, unspecified organism: Secondary | ICD-10-CM | POA: Diagnosis not present

## 2023-09-07 DIAGNOSIS — J440 Chronic obstructive pulmonary disease with acute lower respiratory infection: Secondary | ICD-10-CM | POA: Diagnosis not present

## 2023-09-07 DIAGNOSIS — J9621 Acute and chronic respiratory failure with hypoxia: Secondary | ICD-10-CM | POA: Diagnosis not present

## 2023-09-07 DIAGNOSIS — I130 Hypertensive heart and chronic kidney disease with heart failure and stage 1 through stage 4 chronic kidney disease, or unspecified chronic kidney disease: Secondary | ICD-10-CM | POA: Diagnosis not present

## 2023-09-07 DIAGNOSIS — N189 Chronic kidney disease, unspecified: Secondary | ICD-10-CM | POA: Diagnosis not present

## 2023-09-07 DIAGNOSIS — I5032 Chronic diastolic (congestive) heart failure: Secondary | ICD-10-CM | POA: Diagnosis not present

## 2023-09-07 DIAGNOSIS — D631 Anemia in chronic kidney disease: Secondary | ICD-10-CM | POA: Diagnosis not present

## 2023-09-07 DIAGNOSIS — J188 Other pneumonia, unspecified organism: Secondary | ICD-10-CM | POA: Diagnosis not present

## 2023-09-07 DIAGNOSIS — E1122 Type 2 diabetes mellitus with diabetic chronic kidney disease: Secondary | ICD-10-CM | POA: Diagnosis not present

## 2023-09-09 ENCOUNTER — Encounter: Payer: Self-pay | Admitting: Family Medicine

## 2023-09-09 DIAGNOSIS — J440 Chronic obstructive pulmonary disease with acute lower respiratory infection: Secondary | ICD-10-CM | POA: Diagnosis not present

## 2023-09-09 DIAGNOSIS — N189 Chronic kidney disease, unspecified: Secondary | ICD-10-CM | POA: Diagnosis not present

## 2023-09-09 DIAGNOSIS — A419 Sepsis, unspecified organism: Secondary | ICD-10-CM | POA: Diagnosis not present

## 2023-09-09 DIAGNOSIS — J188 Other pneumonia, unspecified organism: Secondary | ICD-10-CM | POA: Diagnosis not present

## 2023-09-09 DIAGNOSIS — I130 Hypertensive heart and chronic kidney disease with heart failure and stage 1 through stage 4 chronic kidney disease, or unspecified chronic kidney disease: Secondary | ICD-10-CM | POA: Diagnosis not present

## 2023-09-09 DIAGNOSIS — I5032 Chronic diastolic (congestive) heart failure: Secondary | ICD-10-CM | POA: Diagnosis not present

## 2023-09-09 DIAGNOSIS — J9621 Acute and chronic respiratory failure with hypoxia: Secondary | ICD-10-CM | POA: Diagnosis not present

## 2023-09-09 DIAGNOSIS — D631 Anemia in chronic kidney disease: Secondary | ICD-10-CM | POA: Diagnosis not present

## 2023-09-09 DIAGNOSIS — E1122 Type 2 diabetes mellitus with diabetic chronic kidney disease: Secondary | ICD-10-CM | POA: Diagnosis not present

## 2023-09-14 ENCOUNTER — Telehealth: Payer: Self-pay

## 2023-09-14 DIAGNOSIS — J188 Other pneumonia, unspecified organism: Secondary | ICD-10-CM | POA: Diagnosis not present

## 2023-09-14 DIAGNOSIS — I130 Hypertensive heart and chronic kidney disease with heart failure and stage 1 through stage 4 chronic kidney disease, or unspecified chronic kidney disease: Secondary | ICD-10-CM | POA: Diagnosis not present

## 2023-09-14 DIAGNOSIS — I5032 Chronic diastolic (congestive) heart failure: Secondary | ICD-10-CM | POA: Diagnosis not present

## 2023-09-14 DIAGNOSIS — E1122 Type 2 diabetes mellitus with diabetic chronic kidney disease: Secondary | ICD-10-CM | POA: Diagnosis not present

## 2023-09-14 DIAGNOSIS — D631 Anemia in chronic kidney disease: Secondary | ICD-10-CM | POA: Diagnosis not present

## 2023-09-14 DIAGNOSIS — N189 Chronic kidney disease, unspecified: Secondary | ICD-10-CM | POA: Diagnosis not present

## 2023-09-14 DIAGNOSIS — A419 Sepsis, unspecified organism: Secondary | ICD-10-CM | POA: Diagnosis not present

## 2023-09-14 DIAGNOSIS — J440 Chronic obstructive pulmonary disease with acute lower respiratory infection: Secondary | ICD-10-CM | POA: Diagnosis not present

## 2023-09-14 DIAGNOSIS — J9611 Chronic respiratory failure with hypoxia: Secondary | ICD-10-CM | POA: Diagnosis not present

## 2023-09-14 NOTE — Telephone Encounter (Signed)
 [facility] faxed form to be reviewed and signed by provider.  Order [phone]  McGowen inbox front office

## 2023-09-14 NOTE — Telephone Encounter (Signed)
 Forms received and placed on Britt's desk

## 2023-09-15 DIAGNOSIS — I5032 Chronic diastolic (congestive) heart failure: Secondary | ICD-10-CM | POA: Diagnosis not present

## 2023-09-15 DIAGNOSIS — I130 Hypertensive heart and chronic kidney disease with heart failure and stage 1 through stage 4 chronic kidney disease, or unspecified chronic kidney disease: Secondary | ICD-10-CM | POA: Diagnosis not present

## 2023-09-15 DIAGNOSIS — E1122 Type 2 diabetes mellitus with diabetic chronic kidney disease: Secondary | ICD-10-CM | POA: Diagnosis not present

## 2023-09-15 DIAGNOSIS — N189 Chronic kidney disease, unspecified: Secondary | ICD-10-CM | POA: Diagnosis not present

## 2023-09-15 DIAGNOSIS — F02A3 Dementia in other diseases classified elsewhere, mild, with mood disturbance: Secondary | ICD-10-CM

## 2023-09-15 DIAGNOSIS — J188 Other pneumonia, unspecified organism: Secondary | ICD-10-CM | POA: Diagnosis not present

## 2023-09-15 DIAGNOSIS — A419 Sepsis, unspecified organism: Secondary | ICD-10-CM | POA: Diagnosis not present

## 2023-09-15 DIAGNOSIS — F02A4 Dementia in other diseases classified elsewhere, mild, with anxiety: Secondary | ICD-10-CM

## 2023-09-15 DIAGNOSIS — J440 Chronic obstructive pulmonary disease with acute lower respiratory infection: Secondary | ICD-10-CM | POA: Diagnosis not present

## 2023-09-15 DIAGNOSIS — E039 Hypothyroidism, unspecified: Secondary | ICD-10-CM

## 2023-09-15 DIAGNOSIS — J9611 Chronic respiratory failure with hypoxia: Secondary | ICD-10-CM | POA: Diagnosis not present

## 2023-09-15 DIAGNOSIS — D631 Anemia in chronic kidney disease: Secondary | ICD-10-CM | POA: Diagnosis not present

## 2023-09-15 NOTE — Telephone Encounter (Signed)
 Forms signed and faxed back.

## 2023-09-15 NOTE — Telephone Encounter (Signed)
 Reason for CRM: [facility] calling to advise of a fax they are sending. 1 is for a POC and the other is a Physician Order to move a visit to a different day. Order Numbers are: #[id number], [phone]

## 2023-09-17 DIAGNOSIS — I130 Hypertensive heart and chronic kidney disease with heart failure and stage 1 through stage 4 chronic kidney disease, or unspecified chronic kidney disease: Secondary | ICD-10-CM | POA: Diagnosis not present

## 2023-09-17 DIAGNOSIS — A419 Sepsis, unspecified organism: Secondary | ICD-10-CM | POA: Diagnosis not present

## 2023-09-17 DIAGNOSIS — J188 Other pneumonia, unspecified organism: Secondary | ICD-10-CM | POA: Diagnosis not present

## 2023-09-17 DIAGNOSIS — D631 Anemia in chronic kidney disease: Secondary | ICD-10-CM | POA: Diagnosis not present

## 2023-09-17 DIAGNOSIS — I5032 Chronic diastolic (congestive) heart failure: Secondary | ICD-10-CM | POA: Diagnosis not present

## 2023-09-17 DIAGNOSIS — N189 Chronic kidney disease, unspecified: Secondary | ICD-10-CM | POA: Diagnosis not present

## 2023-09-17 DIAGNOSIS — E1122 Type 2 diabetes mellitus with diabetic chronic kidney disease: Secondary | ICD-10-CM | POA: Diagnosis not present

## 2023-09-17 DIAGNOSIS — J9611 Chronic respiratory failure with hypoxia: Secondary | ICD-10-CM | POA: Diagnosis not present

## 2023-09-17 DIAGNOSIS — J440 Chronic obstructive pulmonary disease with acute lower respiratory infection: Secondary | ICD-10-CM | POA: Diagnosis not present

## 2023-09-21 DIAGNOSIS — I130 Hypertensive heart and chronic kidney disease with heart failure and stage 1 through stage 4 chronic kidney disease, or unspecified chronic kidney disease: Secondary | ICD-10-CM | POA: Diagnosis not present

## 2023-09-21 DIAGNOSIS — E1122 Type 2 diabetes mellitus with diabetic chronic kidney disease: Secondary | ICD-10-CM | POA: Diagnosis not present

## 2023-09-21 DIAGNOSIS — J9611 Chronic respiratory failure with hypoxia: Secondary | ICD-10-CM | POA: Diagnosis not present

## 2023-09-21 DIAGNOSIS — D631 Anemia in chronic kidney disease: Secondary | ICD-10-CM | POA: Diagnosis not present

## 2023-09-21 DIAGNOSIS — J188 Other pneumonia, unspecified organism: Secondary | ICD-10-CM | POA: Diagnosis not present

## 2023-09-21 DIAGNOSIS — I5032 Chronic diastolic (congestive) heart failure: Secondary | ICD-10-CM | POA: Diagnosis not present

## 2023-09-21 DIAGNOSIS — J440 Chronic obstructive pulmonary disease with acute lower respiratory infection: Secondary | ICD-10-CM | POA: Diagnosis not present

## 2023-09-21 DIAGNOSIS — A419 Sepsis, unspecified organism: Secondary | ICD-10-CM | POA: Diagnosis not present

## 2023-09-21 DIAGNOSIS — N189 Chronic kidney disease, unspecified: Secondary | ICD-10-CM | POA: Diagnosis not present

## 2023-09-23 DIAGNOSIS — J188 Other pneumonia, unspecified organism: Secondary | ICD-10-CM | POA: Diagnosis not present

## 2023-09-23 DIAGNOSIS — I130 Hypertensive heart and chronic kidney disease with heart failure and stage 1 through stage 4 chronic kidney disease, or unspecified chronic kidney disease: Secondary | ICD-10-CM | POA: Diagnosis not present

## 2023-09-23 DIAGNOSIS — I5032 Chronic diastolic (congestive) heart failure: Secondary | ICD-10-CM | POA: Diagnosis not present

## 2023-09-23 DIAGNOSIS — J9611 Chronic respiratory failure with hypoxia: Secondary | ICD-10-CM | POA: Diagnosis not present

## 2023-09-23 DIAGNOSIS — J440 Chronic obstructive pulmonary disease with acute lower respiratory infection: Secondary | ICD-10-CM | POA: Diagnosis not present

## 2023-09-23 DIAGNOSIS — A419 Sepsis, unspecified organism: Secondary | ICD-10-CM | POA: Diagnosis not present

## 2023-09-23 DIAGNOSIS — D631 Anemia in chronic kidney disease: Secondary | ICD-10-CM | POA: Diagnosis not present

## 2023-09-23 DIAGNOSIS — N189 Chronic kidney disease, unspecified: Secondary | ICD-10-CM | POA: Diagnosis not present

## 2023-09-23 DIAGNOSIS — E1122 Type 2 diabetes mellitus with diabetic chronic kidney disease: Secondary | ICD-10-CM | POA: Diagnosis not present

## 2023-09-24 DIAGNOSIS — N189 Chronic kidney disease, unspecified: Secondary | ICD-10-CM | POA: Diagnosis not present

## 2023-09-24 DIAGNOSIS — I130 Hypertensive heart and chronic kidney disease with heart failure and stage 1 through stage 4 chronic kidney disease, or unspecified chronic kidney disease: Secondary | ICD-10-CM | POA: Diagnosis not present

## 2023-09-24 DIAGNOSIS — J9611 Chronic respiratory failure with hypoxia: Secondary | ICD-10-CM | POA: Diagnosis not present

## 2023-09-24 DIAGNOSIS — E1122 Type 2 diabetes mellitus with diabetic chronic kidney disease: Secondary | ICD-10-CM | POA: Diagnosis not present

## 2023-09-24 DIAGNOSIS — I5032 Chronic diastolic (congestive) heart failure: Secondary | ICD-10-CM | POA: Diagnosis not present

## 2023-09-24 DIAGNOSIS — A419 Sepsis, unspecified organism: Secondary | ICD-10-CM | POA: Diagnosis not present

## 2023-09-24 DIAGNOSIS — J188 Other pneumonia, unspecified organism: Secondary | ICD-10-CM | POA: Diagnosis not present

## 2023-09-24 DIAGNOSIS — J440 Chronic obstructive pulmonary disease with acute lower respiratory infection: Secondary | ICD-10-CM | POA: Diagnosis not present

## 2023-09-24 DIAGNOSIS — D631 Anemia in chronic kidney disease: Secondary | ICD-10-CM | POA: Diagnosis not present

## 2023-09-30 DIAGNOSIS — J440 Chronic obstructive pulmonary disease with acute lower respiratory infection: Secondary | ICD-10-CM | POA: Diagnosis not present

## 2023-09-30 DIAGNOSIS — E1122 Type 2 diabetes mellitus with diabetic chronic kidney disease: Secondary | ICD-10-CM | POA: Diagnosis not present

## 2023-09-30 DIAGNOSIS — J188 Other pneumonia, unspecified organism: Secondary | ICD-10-CM | POA: Diagnosis not present

## 2023-09-30 DIAGNOSIS — J9611 Chronic respiratory failure with hypoxia: Secondary | ICD-10-CM | POA: Diagnosis not present

## 2023-09-30 DIAGNOSIS — A419 Sepsis, unspecified organism: Secondary | ICD-10-CM | POA: Diagnosis not present

## 2023-09-30 DIAGNOSIS — I130 Hypertensive heart and chronic kidney disease with heart failure and stage 1 through stage 4 chronic kidney disease, or unspecified chronic kidney disease: Secondary | ICD-10-CM | POA: Diagnosis not present

## 2023-09-30 DIAGNOSIS — I5032 Chronic diastolic (congestive) heart failure: Secondary | ICD-10-CM | POA: Diagnosis not present

## 2023-09-30 DIAGNOSIS — D631 Anemia in chronic kidney disease: Secondary | ICD-10-CM | POA: Diagnosis not present

## 2023-09-30 DIAGNOSIS — N189 Chronic kidney disease, unspecified: Secondary | ICD-10-CM | POA: Diagnosis not present

## 2023-10-01 DIAGNOSIS — A419 Sepsis, unspecified organism: Secondary | ICD-10-CM | POA: Diagnosis not present

## 2023-10-01 DIAGNOSIS — J9611 Chronic respiratory failure with hypoxia: Secondary | ICD-10-CM | POA: Diagnosis not present

## 2023-10-01 DIAGNOSIS — J440 Chronic obstructive pulmonary disease with acute lower respiratory infection: Secondary | ICD-10-CM | POA: Diagnosis not present

## 2023-10-01 DIAGNOSIS — I5032 Chronic diastolic (congestive) heart failure: Secondary | ICD-10-CM | POA: Diagnosis not present

## 2023-10-01 DIAGNOSIS — E1122 Type 2 diabetes mellitus with diabetic chronic kidney disease: Secondary | ICD-10-CM | POA: Diagnosis not present

## 2023-10-01 DIAGNOSIS — N189 Chronic kidney disease, unspecified: Secondary | ICD-10-CM | POA: Diagnosis not present

## 2023-10-01 DIAGNOSIS — D631 Anemia in chronic kidney disease: Secondary | ICD-10-CM | POA: Diagnosis not present

## 2023-10-01 DIAGNOSIS — J188 Other pneumonia, unspecified organism: Secondary | ICD-10-CM | POA: Diagnosis not present

## 2023-10-01 DIAGNOSIS — I130 Hypertensive heart and chronic kidney disease with heart failure and stage 1 through stage 4 chronic kidney disease, or unspecified chronic kidney disease: Secondary | ICD-10-CM | POA: Diagnosis not present

## 2023-10-05 DIAGNOSIS — D631 Anemia in chronic kidney disease: Secondary | ICD-10-CM | POA: Diagnosis not present

## 2023-10-05 DIAGNOSIS — J188 Other pneumonia, unspecified organism: Secondary | ICD-10-CM | POA: Diagnosis not present

## 2023-10-05 DIAGNOSIS — I130 Hypertensive heart and chronic kidney disease with heart failure and stage 1 through stage 4 chronic kidney disease, or unspecified chronic kidney disease: Secondary | ICD-10-CM | POA: Diagnosis not present

## 2023-10-05 DIAGNOSIS — N189 Chronic kidney disease, unspecified: Secondary | ICD-10-CM | POA: Diagnosis not present

## 2023-10-05 DIAGNOSIS — I5032 Chronic diastolic (congestive) heart failure: Secondary | ICD-10-CM | POA: Diagnosis not present

## 2023-10-05 DIAGNOSIS — E1122 Type 2 diabetes mellitus with diabetic chronic kidney disease: Secondary | ICD-10-CM | POA: Diagnosis not present

## 2023-10-05 DIAGNOSIS — J9611 Chronic respiratory failure with hypoxia: Secondary | ICD-10-CM | POA: Diagnosis not present

## 2023-10-05 DIAGNOSIS — J440 Chronic obstructive pulmonary disease with acute lower respiratory infection: Secondary | ICD-10-CM | POA: Diagnosis not present

## 2023-10-05 DIAGNOSIS — A419 Sepsis, unspecified organism: Secondary | ICD-10-CM | POA: Diagnosis not present

## 2023-10-06 DIAGNOSIS — I130 Hypertensive heart and chronic kidney disease with heart failure and stage 1 through stage 4 chronic kidney disease, or unspecified chronic kidney disease: Secondary | ICD-10-CM | POA: Diagnosis not present

## 2023-10-06 DIAGNOSIS — J440 Chronic obstructive pulmonary disease with acute lower respiratory infection: Secondary | ICD-10-CM | POA: Diagnosis not present

## 2023-10-06 DIAGNOSIS — J188 Other pneumonia, unspecified organism: Secondary | ICD-10-CM | POA: Diagnosis not present

## 2023-10-06 DIAGNOSIS — D631 Anemia in chronic kidney disease: Secondary | ICD-10-CM | POA: Diagnosis not present

## 2023-10-06 DIAGNOSIS — I5032 Chronic diastolic (congestive) heart failure: Secondary | ICD-10-CM | POA: Diagnosis not present

## 2023-10-06 DIAGNOSIS — J9611 Chronic respiratory failure with hypoxia: Secondary | ICD-10-CM | POA: Diagnosis not present

## 2023-10-06 DIAGNOSIS — A419 Sepsis, unspecified organism: Secondary | ICD-10-CM | POA: Diagnosis not present

## 2023-10-06 DIAGNOSIS — N189 Chronic kidney disease, unspecified: Secondary | ICD-10-CM | POA: Diagnosis not present

## 2023-10-06 DIAGNOSIS — E1122 Type 2 diabetes mellitus with diabetic chronic kidney disease: Secondary | ICD-10-CM | POA: Diagnosis not present

## 2023-10-07 ENCOUNTER — Telehealth: Payer: Self-pay

## 2023-10-07 NOTE — Telephone Encounter (Signed)
 Speech therapy home health orders received 10/07/23 for Priscilla Cantrell health initiation orders: Yes.  Home health re-certification orders: No. Patient last seen by ordering physician for this condition:08/06/23. Must be less than 90 days for re-certification and less than 30 days prior for initiation. Visit must have been for the condition the orders are being placed.  Patient meets criteria for Physician to sign orders: Yes.        Current med list has been attached: No        Orders placed on physicians desk for signature: 10/07/23 (date) If patient does not meet criteria for orders to be signed: pt was called to schedule appt. Appt is scheduled for N/A.   [name] D [name]  Placed on PCP desk to review and sign, if appropriate.

## 2023-10-07 NOTE — Telephone Encounter (Signed)
 [facility] faxed 1 form for review and signature from provider.  Order # [phone]  McGowen inbox front office

## 2023-10-07 NOTE — Telephone Encounter (Signed)
 Signed and put in box to go up front. Signed:  Gerlene Hockey, MD           10/07/2023

## 2023-10-08 DIAGNOSIS — E1122 Type 2 diabetes mellitus with diabetic chronic kidney disease: Secondary | ICD-10-CM | POA: Diagnosis not present

## 2023-10-08 DIAGNOSIS — J440 Chronic obstructive pulmonary disease with acute lower respiratory infection: Secondary | ICD-10-CM | POA: Diagnosis not present

## 2023-10-08 DIAGNOSIS — N189 Chronic kidney disease, unspecified: Secondary | ICD-10-CM | POA: Diagnosis not present

## 2023-10-08 DIAGNOSIS — J188 Other pneumonia, unspecified organism: Secondary | ICD-10-CM | POA: Diagnosis not present

## 2023-10-08 DIAGNOSIS — J9611 Chronic respiratory failure with hypoxia: Secondary | ICD-10-CM | POA: Diagnosis not present

## 2023-10-08 DIAGNOSIS — A419 Sepsis, unspecified organism: Secondary | ICD-10-CM | POA: Diagnosis not present

## 2023-10-08 DIAGNOSIS — I5032 Chronic diastolic (congestive) heart failure: Secondary | ICD-10-CM | POA: Diagnosis not present

## 2023-10-08 DIAGNOSIS — I130 Hypertensive heart and chronic kidney disease with heart failure and stage 1 through stage 4 chronic kidney disease, or unspecified chronic kidney disease: Secondary | ICD-10-CM | POA: Diagnosis not present

## 2023-10-08 DIAGNOSIS — D631 Anemia in chronic kidney disease: Secondary | ICD-10-CM | POA: Diagnosis not present

## 2023-10-09 ENCOUNTER — Telehealth: Payer: Self-pay

## 2023-10-09 NOTE — Telephone Encounter (Signed)
Placed on PCP desk to review and sign, if appropriate.  

## 2023-10-09 NOTE — Telephone Encounter (Signed)
 [facility] faxed 1 form for review and signature from provider.   Order # [phone]   McGowen inbox front office

## 2023-10-13 DIAGNOSIS — I130 Hypertensive heart and chronic kidney disease with heart failure and stage 1 through stage 4 chronic kidney disease, or unspecified chronic kidney disease: Secondary | ICD-10-CM | POA: Diagnosis not present

## 2023-10-13 DIAGNOSIS — D631 Anemia in chronic kidney disease: Secondary | ICD-10-CM | POA: Diagnosis not present

## 2023-10-13 DIAGNOSIS — I5032 Chronic diastolic (congestive) heart failure: Secondary | ICD-10-CM | POA: Diagnosis not present

## 2023-10-13 DIAGNOSIS — J188 Other pneumonia, unspecified organism: Secondary | ICD-10-CM | POA: Diagnosis not present

## 2023-10-13 DIAGNOSIS — A419 Sepsis, unspecified organism: Secondary | ICD-10-CM | POA: Diagnosis not present

## 2023-10-13 DIAGNOSIS — J440 Chronic obstructive pulmonary disease with acute lower respiratory infection: Secondary | ICD-10-CM | POA: Diagnosis not present

## 2023-10-13 DIAGNOSIS — N189 Chronic kidney disease, unspecified: Secondary | ICD-10-CM | POA: Diagnosis not present

## 2023-10-13 DIAGNOSIS — E1122 Type 2 diabetes mellitus with diabetic chronic kidney disease: Secondary | ICD-10-CM | POA: Diagnosis not present

## 2023-10-13 DIAGNOSIS — J9611 Chronic respiratory failure with hypoxia: Secondary | ICD-10-CM | POA: Diagnosis not present

## 2023-10-20 DIAGNOSIS — D631 Anemia in chronic kidney disease: Secondary | ICD-10-CM | POA: Diagnosis not present

## 2023-10-20 DIAGNOSIS — J9611 Chronic respiratory failure with hypoxia: Secondary | ICD-10-CM | POA: Diagnosis not present

## 2023-10-20 DIAGNOSIS — J188 Other pneumonia, unspecified organism: Secondary | ICD-10-CM | POA: Diagnosis not present

## 2023-10-20 DIAGNOSIS — A419 Sepsis, unspecified organism: Secondary | ICD-10-CM | POA: Diagnosis not present

## 2023-10-20 DIAGNOSIS — N189 Chronic kidney disease, unspecified: Secondary | ICD-10-CM | POA: Diagnosis not present

## 2023-10-20 DIAGNOSIS — I130 Hypertensive heart and chronic kidney disease with heart failure and stage 1 through stage 4 chronic kidney disease, or unspecified chronic kidney disease: Secondary | ICD-10-CM | POA: Diagnosis not present

## 2023-10-20 DIAGNOSIS — I5032 Chronic diastolic (congestive) heart failure: Secondary | ICD-10-CM | POA: Diagnosis not present

## 2023-10-20 DIAGNOSIS — E1122 Type 2 diabetes mellitus with diabetic chronic kidney disease: Secondary | ICD-10-CM | POA: Diagnosis not present

## 2023-10-22 DIAGNOSIS — I5032 Chronic diastolic (congestive) heart failure: Secondary | ICD-10-CM | POA: Diagnosis not present

## 2023-10-22 DIAGNOSIS — I130 Hypertensive heart and chronic kidney disease with heart failure and stage 1 through stage 4 chronic kidney disease, or unspecified chronic kidney disease: Secondary | ICD-10-CM | POA: Diagnosis not present

## 2023-10-22 DIAGNOSIS — D631 Anemia in chronic kidney disease: Secondary | ICD-10-CM | POA: Diagnosis not present

## 2023-10-22 DIAGNOSIS — E1122 Type 2 diabetes mellitus with diabetic chronic kidney disease: Secondary | ICD-10-CM | POA: Diagnosis not present

## 2023-10-22 DIAGNOSIS — J188 Other pneumonia, unspecified organism: Secondary | ICD-10-CM | POA: Diagnosis not present

## 2023-10-22 DIAGNOSIS — J9611 Chronic respiratory failure with hypoxia: Secondary | ICD-10-CM | POA: Diagnosis not present

## 2023-10-22 DIAGNOSIS — A419 Sepsis, unspecified organism: Secondary | ICD-10-CM | POA: Diagnosis not present

## 2023-10-22 DIAGNOSIS — N189 Chronic kidney disease, unspecified: Secondary | ICD-10-CM | POA: Diagnosis not present

## 2023-10-22 DIAGNOSIS — J440 Chronic obstructive pulmonary disease with acute lower respiratory infection: Secondary | ICD-10-CM | POA: Diagnosis not present

## 2023-10-23 DIAGNOSIS — J188 Other pneumonia, unspecified organism: Secondary | ICD-10-CM | POA: Diagnosis not present

## 2023-10-23 DIAGNOSIS — J9611 Chronic respiratory failure with hypoxia: Secondary | ICD-10-CM | POA: Diagnosis not present

## 2023-10-23 DIAGNOSIS — N189 Chronic kidney disease, unspecified: Secondary | ICD-10-CM | POA: Diagnosis not present

## 2023-10-23 DIAGNOSIS — D631 Anemia in chronic kidney disease: Secondary | ICD-10-CM | POA: Diagnosis not present

## 2023-10-23 DIAGNOSIS — E1122 Type 2 diabetes mellitus with diabetic chronic kidney disease: Secondary | ICD-10-CM | POA: Diagnosis not present

## 2023-10-23 DIAGNOSIS — I130 Hypertensive heart and chronic kidney disease with heart failure and stage 1 through stage 4 chronic kidney disease, or unspecified chronic kidney disease: Secondary | ICD-10-CM | POA: Diagnosis not present

## 2023-10-23 DIAGNOSIS — I5032 Chronic diastolic (congestive) heart failure: Secondary | ICD-10-CM | POA: Diagnosis not present

## 2023-10-23 DIAGNOSIS — A419 Sepsis, unspecified organism: Secondary | ICD-10-CM | POA: Diagnosis not present

## 2023-10-23 DIAGNOSIS — J440 Chronic obstructive pulmonary disease with acute lower respiratory infection: Secondary | ICD-10-CM | POA: Diagnosis not present

## 2023-10-26 ENCOUNTER — Encounter: Payer: Self-pay | Admitting: Family Medicine

## 2023-10-26 ENCOUNTER — Ambulatory Visit (INDEPENDENT_AMBULATORY_CARE_PROVIDER_SITE_OTHER): Admitting: Family Medicine

## 2023-10-26 VITALS — BP 164/73 | HR 65 | Temp 98.0°F | Ht 62.0 in | Wt 108.4 lb

## 2023-10-26 DIAGNOSIS — E039 Hypothyroidism, unspecified: Secondary | ICD-10-CM | POA: Diagnosis not present

## 2023-10-26 DIAGNOSIS — F330 Major depressive disorder, recurrent, mild: Secondary | ICD-10-CM

## 2023-10-26 DIAGNOSIS — F411 Generalized anxiety disorder: Secondary | ICD-10-CM

## 2023-10-26 DIAGNOSIS — R350 Frequency of micturition: Secondary | ICD-10-CM

## 2023-10-26 DIAGNOSIS — J9611 Chronic respiratory failure with hypoxia: Secondary | ICD-10-CM | POA: Diagnosis not present

## 2023-10-26 DIAGNOSIS — F03A18 Unspecified dementia, mild, with other behavioral disturbance: Secondary | ICD-10-CM

## 2023-10-26 DIAGNOSIS — J449 Chronic obstructive pulmonary disease, unspecified: Secondary | ICD-10-CM | POA: Diagnosis not present

## 2023-10-26 LAB — POCT URINALYSIS DIPSTICK
Bilirubin, UA: NEGATIVE
Blood, UA: NEGATIVE
Glucose, UA: NEGATIVE
Ketones, UA: NEGATIVE
Leukocytes, UA: NEGATIVE
Nitrite, UA: NEGATIVE
Protein, UA: POSITIVE — AB
Spec Grav, UA: 1.025 (ref 1.010–1.025)
Urobilinogen, UA: 0.2 U/dL
pH, UA: 6 (ref 5.0–8.0)

## 2023-10-26 MED ORDER — CIPROFLOXACIN HCL 250 MG PO TABS: 250.0000 mg | ORAL_TABLET | Freq: Two times a day (BID) | ORAL | 0 refills | Status: AC

## 2023-10-26 MED ORDER — VENLAFAXINE HCL 50 MG PO TABS: ORAL_TABLET | ORAL | 1 refills | Status: DC

## 2023-10-26 NOTE — Progress Notes (Signed)
 OFFICE VISIT  10/26/2023  CC:  Chief Complaint  Patient presents with   Medical Management of Chronic Issues   HPI: Patient is a 88 y.o. female who presents accompanied by boyfriend [name] for ongoing cognitive impairment and memory problems.  More depression and anxiety lately.  Cries more easily.  Acute periods of anxiety during the day, increased periods of confusion, particularly late afternoons and evenings.  Often says her boyfriend is some other person.  Talks about things that have happened a long time ago as if they just happened, etc.  Also having more urinary urgency and frequency lately. No dysuria. No abdominal pain or fever.  Denies cough, wheezing, shortness of breath, or chest pain. No lower extremity swelling. No headaches, no dizziness, no rash, no tremor, no recent falls.   Past Medical History:  Diagnosis Date   Allergic rhinitis    with upper airway cough: as of 07/2017 pulm f/u she was instructed to use astelin , flonase , and saline more consistently   BPPV (benign paroxysmal positional vertigo) 03/2018   C. difficile colitis    Chest pain, non-cardiac    Cardiac CT showed no signif obstructive dz, mildly elevated calcium  level (Dr. Pietro).   Chronic cystitis with hematuria    Dr. Roman   Chronic diastolic heart failure (HCC)    Chronic hypoxemic respiratory failure (HCC) 02/02/2015   2L oxygen  24/7   Chronic renal insufficiency, stage 2 (mild)    GFR approx 60   Cold hands    NCS/EMGs normal.  Hand dysesthesias per neuro--reassured   COPD (chronic obstructive pulmonary disease) (HCC)    GOLD 4.  spirometry 01/10/09 FEV 0.97(52%), FEV1% 47.  With chronic bronchitis as of 07/2017.   DDD (degenerative disc disease), cervical    1998 MRI C5-6 impingemt (left).  MRI 2021 (neurol) 1 level canal stenosis, 1 level foraminal stenosis--->PT   DDD (degenerative disc disease), lumbar    Depression with anxiety 04/15/2011   Diarrheal disease Summer  2017   ? 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 Dr Luis).  As of 10/05/15 pt reports GI dx'd her with C diff and rx'd more flagyl  and she is also on cholestyramine .  As of 02/2016, pt's sx's resolved completely.   Diverticulosis of colon    Procto '97 and colonoscopy 2002   Dysplastic nevus of upper extremity 04/2014   R tricep (Dr. Livingston)   Edema of both lower extremities due to peripheral venous insufficiency    Varicosities bilat, swelling L>R.  R baker's cyst.  Got vein clinic eval 02/2018->sclerotherapy recommended but since no hemorrhage or ulceration, insurance will not cover the procedure.   Family history of adverse reaction to anesthesia    mother died when patient age 12 receiving anesthesia for thyroid  surgery   Fibrocystic breast disease    w/fibroadenoma.  Bx's showed NO atypia (Dr. Merrilyn).  Mammo neg 2008.   GERD (gastroesophageal reflux disease)    History of double vision    Ophthalmologist, Dr. Cleatus, is further evaluating this with MRI  orbits and limited brain (myesthenia gravis testing neg 07/2010)   History of home oxygen  therapy    uses 2 liters at hs   Hypertension    EKG 03/2010 normal   Hypothyroidism    Hashimoto's, dx'd 1989   Idiopathic peripheral neuropathy 04/2018   Sensory (numb feet) S1 distribution bilat, c/w spinal stenosis sensory neuropathy (no pain). NCS/EMG 10/2018-> Chronic symmetric sensorimotor  axonal polyneuropathy affecting the lower extremities.  Labs [place], MRI C spine->some spondylosis/stenosis.  UE NCS/EMS: normal 04/2019.   Lesion of right native kidney 2017   found on CT 02/2015.  F/u MRI 03/31/15 showed it to be smaller and less likely of any significance but repeat CT renal protocol in 45mo was recommended by radiology.  This was done 10/26/15 and showed no interval change in the lesion, but b/c the lesion enhances with contrast, radiol rec'd urol  consult (b/c pap renal RCC could not be excluded).  Saw urol 03/06/16--stable on u/s 09/2016 and 09/2017.   OAB (overactive bladder)    Osteoarthritis, hip, bilateral    she is s/p bilat THA as of 02/2018.   Osteoporosis    radius, T score -2.5->rec'd alendronate  08/2019->plan rpt DEXA 08/2021.   PAF (paroxysmal atrial fibrillation) (HCC)    Pneumonia    Postmenopausal atrophic vaginitis    improved with estrace 2 X/week.   Prolapse of vaginal cuff after hysterectomy    10/2018: Dr. Louvenia. Mild colpocele 04/2019 per GYN (no surgery)   Pulmonary nodule    CT chest 12/10, June 2011.  No nodule on CT 06/2010.   Recurrent Clostridium difficile diarrhea    Episode 09/19/2016: resolved with prolonged course of flagyl .  11/2016 recurrence treated with 10d of Dificid (Fidaxomicin) by GI (Dr. Luis).   Recurrent UTI    likely related to pt's atrophic vaginitis.  Urol started vaginal estrace 03/2016.   Rib fracture 09/2018   R 7th, laterally-->sustained when her dog pulled her over and she fell.   RLS (restless legs syndrome)    neuro->gabapentin  trial 2020/21   Shingles 02/2014   R abd/flank   Spinal stenosis of lumbar region with neurogenic claudication    2008 MRI (L3-4, L4-5, L5-S1).  +S1 distribution sensory loss bilat    Past Surgical History:  Procedure Laterality Date   ABDOMINAL HYSTERECTOMY  1978   no BSO per pt--nonmalignant reasons   APPENDECTOMY     BREAST CYST EXCISION     Last screening mammogram 10/2010 was normal.   CHOLECYSTECTOMY  2001   COLONOSCOPY  04/2005; 12/06/2015   2007 NORMAL.  2017 adenomatous polyp x 1.  Mod sigmoid diverticulosis (Dr. Luis).     DEXA  05/18/2017; 08/7019   2019 NORMAL (T-score 0.1).  08/2019->T score -2.5->alendronate  started.  03/2022 T score -2.7   HIP CLOSED REDUCTION Right 12/25/2015   Procedure: CLOSED REDUCTION HIP;  Surgeon: Selinda Belvie Gosling, MD;  Location: [facility];  Service: Orthopedics;  Laterality: Right;   NCV/EMS  10/2018   chronic and  symmetric sensorimotor axonal polyneuropathy affecting the lower extremities   TONSILLECTOMY     TOTAL HIP ARTHROPLASTY Right 11/01/2014   Procedure: RIGHT TOTAL HIP ARTHROPLASTY;  Surgeon: Tanda Heading, MD;  Location: [facility];  Service: Orthopedics;  Laterality: Right;   TOTAL HIP ARTHROPLASTY Left 12/08/2017   Procedure: LEFT TOTAL HIP ARTHROPLASTY ANTERIOR APPROACH;  Surgeon: Ernie Cough, MD;  Location: [facility];  Service: Orthopedics;  Laterality: Left;  70 mins   TOTAL HIP REVISION Right 02/10/2023   Procedure: RIGHT TOTAL HIP REVISION;  Surgeon: Ernie Cough, MD;  Location: [facility];  Service: Orthopedics;  Laterality: Right;   TRANSTHORACIC ECHOCARDIOGRAM  08/2014   2016 EF 60-65%, grade I diast dysfxn. 06/21/21 unchanged.  05/2023 EF 60-65%, grd I DD, sev elev pulm art pressure, severe tricusp regurg   TUBAL LIGATION  1974    Outpatient Medications Prior to Visit  Medication Sig  Dispense Refill   acetaminophen  (TYLENOL ) 325 MG tablet Take 2 tablets (650 mg total) by mouth every 6 (six) hours.     albuterol  (PROVENTIL ) (2.5 MG/3ML) 0.083% nebulizer solution Take 2.5 mg by nebulization every 6 (six) hours as needed for wheezing or shortness of breath.     albuterol  (VENTOLIN  HFA) 108 (90 Base) MCG/ACT inhaler INHALE 2 PUFFS EVERY 4 HOURS AS NEEDED FOR WHEEZING OR SHORTNESS OF BREATH 17 g 1   apixaban  (ELIQUIS ) 2.5 MG TABS tablet Take 1 tablet (2.5 mg total) by mouth 2 (two) times daily. 180 tablet 3   diltiazem  (CARDIZEM  CD) 240 MG 24 hr capsule Take 1 capsule (240 mg total) by mouth daily. 30 capsule 0   fluticasone  (FLONASE ) 50 MCG/ACT nasal spray Place 2 sprays into both nostrils daily. 16 g 6   Fluticasone -Umeclidin-Vilant (TRELEGY ELLIPTA ) 100-62.5-25 MCG/ACT AEPB Inhale 1 puff into the lungs daily. 180 each 3   furosemide  (LASIX ) 20 MG tablet 1-3 tabs po every day as needed for legs swelling     guaiFENesin  (MUCINEX ) 600 MG 12 hr tablet Take 2 tablets (1,200 mg total) by mouth 2  (two) times daily. 30 tablet 0   levothyroxine  (SYNTHROID ) 100 MCG tablet Take 1 tablet (100 mcg total) by mouth daily. 30 tablet 1   metoprolol  succinate (TOPROL -XL) 25 MG 24 hr tablet Take 1 tablet (25 mg total) by mouth daily. 30 tablet 0   Misc. Devices (PULSE OXIMETER) MISC Check oxygen  level as needed 1 each 1   pantoprazole  (PROTONIX ) 40 MG tablet Take 1 tablet (40 mg total) by mouth 2 (two) times daily. 180 tablet 1   potassium chloride  SA (KLOR-CON  M) 20 MEQ tablet Take 1 tablet (20 mEq total) by mouth daily. 30 tablet 0   venlafaxine  (EFFEXOR ) 50 MG tablet Take 50 mg by mouth 2 (two) times daily.     No facility-administered medications prior to visit.    Allergies  Allergen Reactions   Losartan  Other (See Comments)    hyperkalemia   Penicillins Hives, Swelling and Rash    Has patient had a PCN reaction causing immediate rash, facial/tongue/throat swelling, SOB or lightheadedness with hypotension: YES Has patient had a PCN reaction causing severe rash involving mucus membranes or skin necrosis: NO Has patient had a PCN reaction that required hospitalization NO Has patient had a PCN reaction occurring within the last 10 years: NO If all of the above answers are NO, then may proceed with Cephalosporin use.    Cefixime [Kdc:Cefixime] Other (See Comments)    unspecified   Gabapentin  Other (See Comments)    Feels woozy   Indocin [Indomethacin] Other (See Comments)    Painful tongue and throat   Singulair  [Montelukast  Sodium]     Chest tightness, decreased mental clarity   Sulfa Antibiotics Other (See Comments)    unspecified   Ace Inhibitors Other (See Comments)    cough   Statins Other (See Comments)    Myalgias     Review of Systems As per HPI  PE:    10/26/2023    3:33 PM 10/26/2023    3:31 PM 08/06/2023    1:10 PM  Vitals with BMI  Height  5' 2 5' 2  Weight  108 lbs 6 oz 106 lbs 6 oz  BMI  19.82 19.46  Systolic 164 183 893  Diastolic 73 75 56  Pulse  65  88     Physical Exam  Gen: Alert, well appearing.  Patient is oriented  to person, place, time, and situation. AFFECT: pleasant, lucid thought and speech.   LABS:  Last CBC Lab Results  Component Value Date   WBC 16.3 (H) 06/16/2023   HGB 12.3 06/16/2023   HCT 38.3 06/16/2023   MCV 88.5 06/16/2023   MCH 28.4 06/16/2023   RDW 12.8 06/16/2023   PLT 259 06/16/2023   Last metabolic panel Lab Results  Component Value Date   GLUCOSE 81 07/22/2023   NA 142 07/22/2023   K 3.8 07/22/2023   CL 104 07/22/2023   CO2 30 07/22/2023   BUN 13 07/22/2023   CREATININE 0.71 07/22/2023   GFR 77.10 07/22/2023   CALCIUM  8.8 07/22/2023   PHOS 3.0 06/16/2023   PROT 6.0 07/22/2023   ALBUMIN 3.8 07/22/2023   BILITOT 0.5 07/22/2023   ALKPHOS 80 07/22/2023   AST 13 07/22/2023   ALT 9 07/22/2023   ANIONGAP 9 06/16/2023   Last hemoglobin A1c Lab Results  Component Value Date   HGBA1C 5.5 07/22/2023   HGBA1C 5.5 07/22/2023   HGBA1C 5.5 (A) 07/22/2023   HGBA1C 5.5 07/22/2023   Last thyroid  functions Lab Results  Component Value Date   TSH 0.14 (L) 07/22/2023   T3TOTAL 90 12/25/2022   T4TOTAL 12.4 07/11/2010   Lab Results  Component Value Date   VITAMINB12 528 09/11/2022   IMPRESSION AND PLAN:  #1 dementia with some behavioral/personality changes, depression, and increased anxiety. Increase venlafaxine  to two of the 50 mg tabs in the morning and continue 1 in the evening.  #2 urinary urgency and frequency. History of recurrent UTI. Start Cipro  250 mg twice daily for 5 days. Sent urine specimen for culture.  #3 chronic hypoxic respiratory failure due to COPD, stable on Trelegy Ellipta  1 puff daily and 2 L oxygen  24/7. She has initial consult appointment with Dr. Jude next month (she formerly had seen Dr. Shellia).  She mentioned interest in Dupixent.  #4 hypothyroidism. Last TSH on 07/22/2023 was slightly low.  Dose was adjusted at that time. Repeat TSH today.  An After Visit  Summary was printed and given to the patient.  FOLLOW UP: Return in about 4 weeks (around 11/23/2023) for f/u mood/anx.  Signed:  Gerlene Hockey, MD           10/26/2023

## 2023-10-27 DIAGNOSIS — N189 Chronic kidney disease, unspecified: Secondary | ICD-10-CM | POA: Diagnosis not present

## 2023-10-27 DIAGNOSIS — I130 Hypertensive heart and chronic kidney disease with heart failure and stage 1 through stage 4 chronic kidney disease, or unspecified chronic kidney disease: Secondary | ICD-10-CM | POA: Diagnosis not present

## 2023-10-27 DIAGNOSIS — I5032 Chronic diastolic (congestive) heart failure: Secondary | ICD-10-CM | POA: Diagnosis not present

## 2023-10-27 DIAGNOSIS — L648 Other androgenic alopecia: Secondary | ICD-10-CM | POA: Diagnosis not present

## 2023-10-27 DIAGNOSIS — J9611 Chronic respiratory failure with hypoxia: Secondary | ICD-10-CM | POA: Diagnosis not present

## 2023-10-27 DIAGNOSIS — D631 Anemia in chronic kidney disease: Secondary | ICD-10-CM | POA: Diagnosis not present

## 2023-10-27 DIAGNOSIS — A419 Sepsis, unspecified organism: Secondary | ICD-10-CM | POA: Diagnosis not present

## 2023-10-27 DIAGNOSIS — J440 Chronic obstructive pulmonary disease with acute lower respiratory infection: Secondary | ICD-10-CM | POA: Diagnosis not present

## 2023-10-27 DIAGNOSIS — E1122 Type 2 diabetes mellitus with diabetic chronic kidney disease: Secondary | ICD-10-CM | POA: Diagnosis not present

## 2023-10-27 DIAGNOSIS — J188 Other pneumonia, unspecified organism: Secondary | ICD-10-CM | POA: Diagnosis not present

## 2023-10-27 LAB — URINE CULTURE
MICRO NUMBER:: 16999122
Result:: NO GROWTH
SPECIMEN QUALITY:: ADEQUATE

## 2023-10-27 LAB — TSH: TSH: 0.14 u[IU]/mL — ABNORMAL LOW (ref 0.35–5.50)

## 2023-10-28 ENCOUNTER — Ambulatory Visit: Payer: Self-pay | Admitting: Family Medicine

## 2023-10-29 ENCOUNTER — Telehealth: Payer: Self-pay

## 2023-10-29 NOTE — Telephone Encounter (Signed)
 Copied from CRM [id number]. Topic: Clinical - Lab/Test Results >> Oct 29, 2023 12:50 PM Chiquita SQUIBB wrote: Reason for CRM: Heron from [facility] is calling in requesting the results of the urine culture, urine dipstick and also the TSH results. Please advise her at [phone].

## 2023-10-30 ENCOUNTER — Other Ambulatory Visit: Payer: Self-pay

## 2023-10-30 ENCOUNTER — Emergency Department (HOSPITAL_BASED_OUTPATIENT_CLINIC_OR_DEPARTMENT_OTHER)
Admission: EM | Admit: 2023-10-30 | Discharge: 2023-10-30 | Discharge status: Against Medical Advice | Source: Ambulatory Visit | Attending: Emergency Medicine | Admitting: Emergency Medicine

## 2023-10-30 ENCOUNTER — Ambulatory Visit: Payer: Self-pay

## 2023-10-30 DIAGNOSIS — Z5321 Procedure and treatment not carried out due to patient leaving prior to being seen by health care provider: Secondary | ICD-10-CM | POA: Insufficient documentation

## 2023-10-30 DIAGNOSIS — I10 Essential (primary) hypertension: Secondary | ICD-10-CM | POA: Diagnosis not present

## 2023-10-30 NOTE — Telephone Encounter (Signed)
 Spoke with Heron and advised of recent results, she will update her records.

## 2023-10-30 NOTE — Telephone Encounter (Signed)
 FYI Only or Action Required?: FYI only for provider.  Patient was last seen in primary care on 10/26/2023 by McGowen, Aleene DEL, MD.  Called Nurse Triage reporting Hypertension.  Symptoms began today.  Interventions attempted: Prescription medications: Metoprolol .  Symptoms are: unchanged.  Triage Disposition: Go to ED Now (Notify PCP)  Patient/caregiver understands and will follow disposition?: Yes Reason for Disposition  [1] Systolic BP >= 160 OR Diastolic >= 100 AND [2] cardiac (e.g., breathing difficulty, chest pain) or neurologic symptoms (e.g., new-onset blurred or double vision, unsteady gait)  Answer Assessment - Initial Assessment Questions Ulla, patients niece calling on behalf of patient.   1. BLOOD PRESSURE: What is your blood pressure? Did you take at least two measurements 5 minutes apart?     200/107 at 12pm, 163/83 at 1230pm, 169/97 5 minutes ago  2. ONSET: When did you take your blood pressure?     This past week having problems with it being high   3. HOW: How did you take your blood pressure? (e.g., automatic home BP monitor, visiting nurse)     Automatic BP monitor  4. HISTORY: Do you have a history of high blood pressure?     No  5. MEDICINES: Are you taking any medicines for blood pressure? Have you missed any doses recently?     Metoprolol , taken this morning  6. OTHER SYMPTOMS: Do you have any symptoms? (e.g., blurred vision, chest pain, difficulty breathing, headache, weakness)     Anxiety up, but more fatigued, weak  Protocols used: Blood Pressure - High-A-AH  Copied from CRM #[id number]. Topic: Clinical - Red Word Triage >> Oct 30, 2023  1:17 PM Taleah C wrote: Red Word that prompted transfer to Nurse Triage: high bp, since 11:55 200/109 , pulse at 87. Tiredness/sleepy

## 2023-10-30 NOTE — Telephone Encounter (Signed)
 FYI  Please see below

## 2023-10-30 NOTE — ED Triage Notes (Addendum)
 Patient states BP was high at PCP today. Denies headache, dizziness, or any other symptoms. Wants to be evaluated. Taking BP medications as prescribed. On 2L oxygen  [place] at baseline.

## 2023-11-06 ENCOUNTER — Other Ambulatory Visit: Payer: Self-pay

## 2023-11-06 DIAGNOSIS — A419 Sepsis, unspecified organism: Secondary | ICD-10-CM | POA: Diagnosis not present

## 2023-11-06 DIAGNOSIS — J188 Other pneumonia, unspecified organism: Secondary | ICD-10-CM | POA: Diagnosis not present

## 2023-11-06 DIAGNOSIS — I130 Hypertensive heart and chronic kidney disease with heart failure and stage 1 through stage 4 chronic kidney disease, or unspecified chronic kidney disease: Secondary | ICD-10-CM | POA: Diagnosis not present

## 2023-11-06 DIAGNOSIS — J9611 Chronic respiratory failure with hypoxia: Secondary | ICD-10-CM | POA: Diagnosis not present

## 2023-11-06 DIAGNOSIS — E1122 Type 2 diabetes mellitus with diabetic chronic kidney disease: Secondary | ICD-10-CM | POA: Diagnosis not present

## 2023-11-06 DIAGNOSIS — J440 Chronic obstructive pulmonary disease with acute lower respiratory infection: Secondary | ICD-10-CM | POA: Diagnosis not present

## 2023-11-06 DIAGNOSIS — I5032 Chronic diastolic (congestive) heart failure: Secondary | ICD-10-CM | POA: Diagnosis not present

## 2023-11-06 DIAGNOSIS — N189 Chronic kidney disease, unspecified: Secondary | ICD-10-CM | POA: Diagnosis not present

## 2023-11-06 DIAGNOSIS — D631 Anemia in chronic kidney disease: Secondary | ICD-10-CM | POA: Diagnosis not present

## 2023-11-06 MED ORDER — PANTOPRAZOLE SODIUM 40 MG PO TBEC: 40.0000 mg | DELAYED_RELEASE_TABLET | Freq: Two times a day (BID) | ORAL | 1 refills | Status: DC

## 2023-11-09 ENCOUNTER — Other Ambulatory Visit: Payer: Self-pay | Admitting: Pulmonary Disease

## 2023-11-19 ENCOUNTER — Encounter (HOSPITAL_BASED_OUTPATIENT_CLINIC_OR_DEPARTMENT_OTHER): Payer: Self-pay | Admitting: Pulmonary Disease

## 2023-11-19 ENCOUNTER — Ambulatory Visit (HOSPITAL_BASED_OUTPATIENT_CLINIC_OR_DEPARTMENT_OTHER): Admitting: Pulmonary Disease

## 2023-11-19 VITALS — BP 170/83 | HR 62 | Ht 62.0 in | Wt 106.4 lb

## 2023-11-19 DIAGNOSIS — J439 Emphysema, unspecified: Secondary | ICD-10-CM

## 2023-11-19 DIAGNOSIS — J4489 Other specified chronic obstructive pulmonary disease: Secondary | ICD-10-CM | POA: Diagnosis not present

## 2023-11-19 DIAGNOSIS — J9611 Chronic respiratory failure with hypoxia: Secondary | ICD-10-CM | POA: Diagnosis not present

## 2023-11-19 NOTE — Patient Instructions (Addendum)
 X Ohtuwayre paperwork  X Repeat BP   VISIT SUMMARY: You came in today for a follow-up visit to establish care for your COPD. We discussed your current symptoms, medications, and recent health events, including your ER visit and elevated blood pressure.  YOUR PLAN: -CHRONIC OBSTRUCTIVE PULMONARY DISEASE (COPD) WITH CHRONIC RESPIRATORY FAILURE AND HYPOXIA: COPD is a chronic lung condition that makes it hard to breathe and can lead to respiratory failure and low oxygen  levels. You will continue using your current medications, Trelegy and albuterol  nebulizer, and we will start paperwork for a new nebulizer medication called Ohtuwayre. Please monitor your sputum color and contact us  if it turns yellow or green, as this may require antibiotics. Also, watch for worsening wheezing and let us  know if it happens, as you may need prednisone .  INSTRUCTIONS: Please follow up with your primary care physician next week for hypertension management.                      Contains text generated by Abridge.                                 Contains text generated by Abridge.

## 2023-11-19 NOTE — Progress Notes (Signed)
 Subjective:    Patient ID: Priscilla Cantrell, female    DOB: [birth year], 88 y.o.   MRN: [id number]   88 yo ex-smoker for FU of  GOLD -4 COPD and nocturnal Oxygen .    PMH : Chronic cystitis, DDD, Anxiety, Depression, C diff 2017 and 2018, Diverticulosis, GERD, HTN, Hypothyroidism, PNA, Shingles, Spinal stenosis   05/2023 hosp for para influenza infection,copd exac  Discussed the use of AI scribe software for clinical note transcription with the patient, who gave verbal consent to proceed.  History of Present Illness Priscilla Cantrell is an 88 year old female with COPD who presents for a follow-up visit to establish care. She was previously under the care of Dr. Shellia.  She is on nocturnal oxygen  therapy and has been using oxygen  for two years. She experiences ongoing dyspnea with variability in ambulating short distances. She recalls a previous episode of severe bronchitis or COPD exacerbation managed with prednisone . Her current medications include Trelegy and Lasix , though she is uncertain about the necessity of Lasix . She also uses a nebulizer with albuterol  at home.  She has a cough with clear phlegm and no recent changes in the color of the phlegm. Her blood pressure was elevated today, and she is on two antihypertensive medications. She visited the ER two weeks ago due to prolonged wait times and did not receive treatment for her blood pressure. She has a follow-up appointment with her primary care physician next week.     Significant tests/ events reviewed   Spirometry 01/10/09>>FEV1 0.97(52%), FEV1% 47 RAST 05/04/17 >> negative, IgE 8 Spirometry 05/14/17 >> FEV1 0.91 (55%), FEV1% 48 PFT 10/29/18 >> FEV1 1.05 (68%), FEV1% 52, TLC 4.29 (96%), DLCO 44% PSG 11/14/11 >> AHI 0.3  ONO with RA 07/31/16 >> test time 6 hrs 41 min.  Average SpO2 87%, low SpO2 74%.  Spent 4 hrs 13 min with SpO2 < 88%. ONO with 2 liters 07/24/21 >> test time 3 hrs 58 min.  Baseline SpO2 97%, low SpO2 84%.  Spent 19  sec with SpO2 < 88%.  Review of Systems  neg for any significant sore throat, dysphagia, itching, sneezing, nasal congestion or excess/ purulent secretions, fever, chills, sweats, unintended wt loss, pleuritic or exertional cp, hempoptysis, orthopnea pnd or change in chronic leg swelling. Also denies presyncope, palpitations, heartburn, abdominal pain, nausea, vomiting, diarrhea or change in bowel or urinary habits, dysuria,hematuria, rash, arthralgias, visual complaints, headache, numbness weakness or ataxia.      Objective:   Physical Exam  Gen. Pleasant, elderly, petite, well-nourished, in no distress ENT - no thrush, no pallor/icterus,no post nasal drip Neck: No JVD, no thyromegaly, no carotid bruits Lungs: no use of accessory muscles, no dullness to percussion, clear without rales or rhonchi  Cardiovascular: Rhythm regular, heart sounds  normal, no murmurs or gallops, no peripheral edema Musculoskeletal: No deformities, no cyanosis or clubbing        Assessment & Plan:   Assessment and Plan Assessment & Plan Chronic obstructive pulmonary disease (COPD) with chronic respiratory failure and hypoxia She has COPD with chronic respiratory failure and hypoxia, requiring nocturnal oxygen . Reports ongoing dyspnea, variable exercise tolerance, and a productive cough with clear sputum. Currently on Trelegy and albuterol  nebulizer. Discussed new nebulizer medication with minimal side effects, such as back pain and hypertension. Advised against injectable medication due to administration difficulty.   - Initiate paperwork for new nebulizer medication (O2 Wear) for COPD management - Monitor for changes in sputum color; if yellow  or green, contact for antibiotic prescription - Monitor for worsening wheezing; if it occurs, contact for prednisone  prescription - Continue current medications: Trelegy and albuterol  nebulizer  Hypertension-  lower on rpt check - Follow up with primary care for  hypertension management next week

## 2023-11-24 DIAGNOSIS — N189 Chronic kidney disease, unspecified: Secondary | ICD-10-CM | POA: Diagnosis not present

## 2023-11-24 DIAGNOSIS — J9611 Chronic respiratory failure with hypoxia: Secondary | ICD-10-CM | POA: Diagnosis not present

## 2023-11-24 DIAGNOSIS — D631 Anemia in chronic kidney disease: Secondary | ICD-10-CM | POA: Diagnosis not present

## 2023-11-24 DIAGNOSIS — I5032 Chronic diastolic (congestive) heart failure: Secondary | ICD-10-CM | POA: Diagnosis not present

## 2023-11-24 DIAGNOSIS — E1142 Type 2 diabetes mellitus with diabetic polyneuropathy: Secondary | ICD-10-CM | POA: Diagnosis not present

## 2023-11-24 DIAGNOSIS — E1122 Type 2 diabetes mellitus with diabetic chronic kidney disease: Secondary | ICD-10-CM | POA: Diagnosis not present

## 2023-11-24 DIAGNOSIS — J449 Chronic obstructive pulmonary disease, unspecified: Secondary | ICD-10-CM | POA: Diagnosis not present

## 2023-11-24 DIAGNOSIS — I130 Hypertensive heart and chronic kidney disease with heart failure and stage 1 through stage 4 chronic kidney disease, or unspecified chronic kidney disease: Secondary | ICD-10-CM | POA: Diagnosis not present

## 2023-11-24 DIAGNOSIS — E039 Hypothyroidism, unspecified: Secondary | ICD-10-CM | POA: Diagnosis not present

## 2023-11-26 ENCOUNTER — Telehealth (HOSPITAL_BASED_OUTPATIENT_CLINIC_OR_DEPARTMENT_OTHER): Payer: Self-pay

## 2023-11-26 ENCOUNTER — Ambulatory Visit: Admitting: Family Medicine

## 2023-11-26 ENCOUNTER — Encounter: Payer: Self-pay | Admitting: Family Medicine

## 2023-11-26 VITALS — BP 182/70 | HR 63 | Temp 97.6°F | Wt 106.6 lb

## 2023-11-26 DIAGNOSIS — J9611 Chronic respiratory failure with hypoxia: Secondary | ICD-10-CM | POA: Diagnosis not present

## 2023-11-26 DIAGNOSIS — E039 Hypothyroidism, unspecified: Secondary | ICD-10-CM

## 2023-11-26 DIAGNOSIS — F03918 Unspecified dementia, unspecified severity, with other behavioral disturbance: Secondary | ICD-10-CM

## 2023-11-26 DIAGNOSIS — I10 Essential (primary) hypertension: Secondary | ICD-10-CM | POA: Diagnosis not present

## 2023-11-26 DIAGNOSIS — J441 Chronic obstructive pulmonary disease with (acute) exacerbation: Secondary | ICD-10-CM | POA: Diagnosis not present

## 2023-11-26 MED ORDER — PREDNISONE 20 MG PO TABS: ORAL_TABLET | ORAL | 0 refills | Status: DC

## 2023-11-26 MED ORDER — METHYLPREDNISOLONE ACETATE 80 MG/ML IJ SUSP
80.0000 mg | Freq: Once | INTRAMUSCULAR | Status: AC
  Administered 2023-11-26: 80 mg via INTRAMUSCULAR

## 2023-11-26 MED ORDER — AZITHROMYCIN 250 MG PO TABS: ORAL_TABLET | ORAL | 0 refills | Status: DC

## 2023-11-26 MED ORDER — LEVOFLOXACIN 500 MG PO TABS: 500.0000 mg | ORAL_TABLET | Freq: Every day | ORAL | 0 refills | Status: AC

## 2023-11-26 MED ORDER — IRBESARTAN 75 MG PO TABS: ORAL_TABLET | ORAL | 0 refills | Status: DC

## 2023-11-26 NOTE — Progress Notes (Signed)
 OFFICE VISIT  11/26/2023  CC:  Chief Complaint  Patient presents with   Medical Management of Chronic Issues    Patient is a 88 y.o. female who presents for 1 month follow-up labile hypertension, dementia with behavioral changes, history of recurrent UTI, and COPD with chronic hypoxic respiratory failure. A/P as of last visit: #1 dementia with some behavioral/personality changes, depression, and increased anxiety. Increase venlafaxine  to two of the 50 mg tabs in the morning and continue 1 in the evening.   #2 urinary urgency and frequency. History of recurrent UTI. Start Cipro  250 mg twice daily for 5 days. Sent urine specimen for culture.   #3 chronic hypoxic respiratory failure due to COPD, stable on Trelegy Ellipta  1 puff daily and 2 L oxygen  24/7. She has initial consult appointment with Dr. Jude next month (she formerly had seen Dr. Shellia).  She mentioned interest in Dupixent.   #4 hypothyroidism. Last TSH on 07/22/2023 was slightly low.  Dose was adjusted at that time. Repeat TSH today.  INTERIM HX: TSH a little bit low at last check a month ago.  Changed dosing to one half of the 100 mcg tab 2 days a week and 1 whole tab all other days of the week.  Urine culture last visit showed no growth.  She is having increased cough with production of yellow phlegm for the last couple of days. Has increased wheezing and little bit increased work of breathing.  Still only requiring 1 to 2 L of oxygen .  No known fever. Has not had a nebulizer treatment today.  Her dementia is progressing.  She has frequent hallucinations and thinks that people live in her home with her and her boyfriend.  She talks to them. If she is left alone for even an hour she often ends up with her oxygen  often tangled in it and sometimes wandering outside the house.  Her blood pressure was significantly elevated at home on 10/30/2023 and she went to the emergency department.  It appears she may have left prior  to being seen by a provider.  ROS as above, plus--> no chest pain no dizziness, no HAs, no rashes, no melena/hematochezia.  No polyuria or polydipsia.  No myalgias or arthralgias.  No focal weakness, paresthesias, or tremors.  No acute vision or hearing abnormalities.  No dysuria or unusual/new urinary urgency or frequency.  No recent changes in lower legs. No n/v/d or abd pain.  No palpitations.    Past Medical History:  Diagnosis Date   Allergic rhinitis    with upper airway cough: as of 07/2017 pulm f/u she was instructed to use astelin , flonase , and saline more consistently   BPPV (benign paroxysmal positional vertigo) 03/2018   C. difficile colitis    Chest pain, non-cardiac    Cardiac CT showed no signif obstructive dz, mildly elevated calcium  level (Dr. Pietro).   Chronic cystitis with hematuria    Dr. Roman   Chronic diastolic heart failure (HCC)    Chronic hypoxemic respiratory failure (HCC) 02/02/2015   2L oxygen  24/7   Chronic renal insufficiency, stage 2 (mild)    GFR approx 60   Cold hands    NCS/EMGs normal.  Hand dysesthesias per neuro--reassured   COPD (chronic obstructive pulmonary disease) (HCC)    GOLD 4.  spirometry 01/10/09 FEV 0.97(52%), FEV1% 47.  With chronic bronchitis as of 07/2017.   DDD (degenerative disc disease), cervical    1998 MRI C5-6 impingemt (left).  MRI 2021 (neurol) 1 level  canal stenosis, 1 level foraminal stenosis--->PT   DDD (degenerative disc disease), lumbar    Depression with anxiety 04/15/2011   Diarrheal disease Summer 2017   ? 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 Dr Luis).  As of 10/05/15 pt reports GI dx'd her with C diff and rx'd more flagyl  and she is also on cholestyramine .  As of 02/2016, pt's sx's resolved completely.   Diverticulosis of colon    Procto '97 and colonoscopy 2002   Dysplastic nevus of upper extremity 04/2014    R tricep (Dr. Livingston)   Edema of both lower extremities due to peripheral venous insufficiency    Varicosities bilat, swelling L>R.  R baker's cyst.  Got vein clinic eval 02/2018->sclerotherapy recommended but since no hemorrhage or ulceration, insurance will not cover the procedure.   Family history of adverse reaction to anesthesia    mother died when patient age 22 receiving anesthesia for thyroid  surgery   Fibrocystic breast disease    w/fibroadenoma.  Bx's showed NO atypia (Dr. Merrilyn).  Mammo neg 2008.   GERD (gastroesophageal reflux disease)    History of double vision    Ophthalmologist, Dr. Cleatus, is further evaluating this with MRI  orbits and limited brain (myesthenia gravis testing neg 07/2010)   History of home oxygen  therapy    uses 2 liters at hs   Hypertension    EKG 03/2010 normal   Hypothyroidism    Hashimoto's, dx'd 1989   Idiopathic peripheral neuropathy 04/2018   Sensory (numb feet) S1 distribution bilat, c/w spinal stenosis sensory neuropathy (no pain). 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  Labs [place], MRI C spine->some spondylosis/stenosis.  UE NCS/EMS: normal 04/2019.   Lesion of right native kidney 2017   found on CT 02/2015.  F/u MRI 03/31/15 showed it to be smaller and less likely of any significance but repeat CT renal protocol in 61mo was recommended by radiology.  This was done 10/26/15 and showed no interval change in the lesion, but b/c the lesion enhances with contrast, radiol rec'd urol consult (b/c pap renal RCC could not be excluded).  Saw urol 03/06/16--stable on u/s 09/2016 and 09/2017.   OAB (overactive bladder)    Osteoarthritis, hip, bilateral    she is s/p bilat THA as of 02/2018.   Osteoporosis    radius, T score -2.5->rec'd alendronate  08/2019->plan rpt DEXA 08/2021.   PAF (paroxysmal atrial fibrillation) (HCC)    Pneumonia    Postmenopausal atrophic vaginitis    improved with estrace 2  X/week.   Prolapse of vaginal cuff after hysterectomy    10/2018: Dr. Louvenia. Mild colpocele 04/2019 per GYN (no surgery)   Pulmonary nodule    CT chest 12/10, June 2011.  No nodule on CT 06/2010.   Recurrent Clostridium difficile diarrhea    Episode 09/19/2016: resolved with prolonged course of flagyl .  11/2016 recurrence treated with 10d of Dificid (Fidaxomicin) by GI (Dr. Luis).   Recurrent UTI    likely related to pt's atrophic vaginitis.  Urol started vaginal estrace 03/2016.   Rib fracture 09/2018   R 7th, laterally-->sustained when her dog pulled her over and she fell.   RLS (restless legs syndrome)    neuro->gabapentin  trial 2020/21   Shingles 02/2014   R abd/flank   Spinal stenosis of lumbar region with neurogenic claudication    2008 MRI (L3-4, L4-5, L5-S1).  +S1 distribution sensory loss bilat  Past Surgical History:  Procedure Laterality Date   ABDOMINAL HYSTERECTOMY  1978   no BSO per pt--nonmalignant reasons   APPENDECTOMY     BREAST CYST EXCISION     Last screening mammogram 10/2010 was normal.   CHOLECYSTECTOMY  2001   COLONOSCOPY  04/2005; 12/06/2015   2007 NORMAL.  2017 adenomatous polyp x 1.  Mod sigmoid diverticulosis (Dr. Luis).     DEXA  05/18/2017; 08/7019   2019 NORMAL (T-score 0.1).  08/2019->T score -2.5->alendronate  started.  03/2022 T score -2.7   HIP CLOSED REDUCTION Right 12/25/2015   Procedure: CLOSED REDUCTION HIP;  Surgeon: Selinda Belvie Gosling, MD;  Location: [facility];  Service: Orthopedics;  Laterality: Right;   NCV/EMS  10/2018   chronic and symmetric sensorimotor axonal polyneuropathy affecting the lower extremities   TONSILLECTOMY     TOTAL HIP ARTHROPLASTY Right 11/01/2014   Procedure: RIGHT TOTAL HIP ARTHROPLASTY;  Surgeon: Tanda Heading, MD;  Location: [facility];  Service: Orthopedics;  Laterality: Right;   TOTAL HIP ARTHROPLASTY Left 12/08/2017   Procedure: LEFT TOTAL HIP ARTHROPLASTY ANTERIOR APPROACH;  Surgeon: Ernie Cough, MD;  Location:  [facility];  Service: Orthopedics;  Laterality: Left;  70 mins   TOTAL HIP REVISION Right 02/10/2023   Procedure: RIGHT TOTAL HIP REVISION;  Surgeon: Ernie Cough, MD;  Location: [facility];  Service: Orthopedics;  Laterality: Right;   TRANSTHORACIC ECHOCARDIOGRAM  08/2014   2016 EF 60-65%, grade I diast dysfxn. 06/21/21 unchanged.  05/2023 EF 60-65%, grd I DD, sev elev pulm art pressure, severe tricusp regurg   TUBAL LIGATION  1974    Outpatient Medications Prior to Visit  Medication Sig Dispense Refill   acetaminophen  (TYLENOL ) 325 MG tablet Take 2 tablets (650 mg total) by mouth every 6 (six) hours.     albuterol  (PROVENTIL ) (2.5 MG/3ML) 0.083% nebulizer solution Take 2.5 mg by nebulization every 6 (six) hours as needed for wheezing or shortness of breath.     albuterol  (VENTOLIN  HFA) 108 (90 Base) MCG/ACT inhaler INHALE 2 PUFFS EVERY 4 HOURS AS NEEDED FOR WHEEZING OR SHORTNESS OF BREATH 17 g 0   apixaban  (ELIQUIS ) 2.5 MG TABS tablet Take 1 tablet (2.5 mg total) by mouth 2 (two) times daily. 180 tablet 3   diltiazem  (CARDIZEM  CD) 240 MG 24 hr capsule Take 1 capsule (240 mg total) by mouth daily. 30 capsule 0   fluticasone  (FLONASE ) 50 MCG/ACT nasal spray Place 2 sprays into both nostrils daily. 16 g 6   Fluticasone -Umeclidin-Vilant (TRELEGY ELLIPTA ) 100-62.5-25 MCG/ACT AEPB Inhale 1 puff into the lungs daily. 180 each 3   furosemide  (LASIX ) 20 MG tablet 1-3 tabs po every day as needed for legs swelling     guaiFENesin  (MUCINEX ) 600 MG 12 hr tablet Take 2 tablets (1,200 mg total) by mouth 2 (two) times daily. 30 tablet 0   levothyroxine  (SYNTHROID ) 100 MCG tablet Take 1 tablet (100 mcg total) by mouth daily. 30 tablet 1   metoprolol  succinate (TOPROL -XL) 25 MG 24 hr tablet Take 1 tablet (25 mg total) by mouth daily. 30 tablet 0   Misc. Devices (PULSE OXIMETER) MISC Check oxygen  level as needed 1 each 1   pantoprazole  (PROTONIX ) 40 MG tablet Take 1 tablet (40 mg total) by mouth 2 (two) times daily. 180  tablet 1   potassium chloride  SA (KLOR-CON  M) 20 MEQ tablet Take 1 tablet (20 mEq total) by mouth daily. 30 tablet 0   venlafaxine  (EFFEXOR ) 50 MG tablet 2 tabs po qAM and 1  tab po qhs 90 tablet 1   azithromycin  (ZITHROMAX ) 250 MG tablet Take 2 tabs day one then 1 tab daily until finished 6 tablet 0   No facility-administered medications prior to visit.    Allergies  Allergen Reactions   Losartan  Other (See Comments)    hyperkalemia   Penicillins Hives, Swelling and Rash    Has patient had a PCN reaction causing immediate rash, facial/tongue/throat swelling, SOB or lightheadedness with hypotension: YES Has patient had a PCN reaction causing severe rash involving mucus membranes or skin necrosis: NO Has patient had a PCN reaction that required hospitalization NO Has patient had a PCN reaction occurring within the last 10 years: NO If all of the above answers are NO, then may proceed with Cephalosporin use.    Cefixime [Kdc:Cefixime] Other (See Comments)    unspecified   Gabapentin  Other (See Comments)    Feels woozy   Indocin [Indomethacin] Other (See Comments)    Painful tongue and throat   Singulair  [Montelukast  Sodium]     Chest tightness, decreased mental clarity   Sulfa Antibiotics Other (See Comments)    unspecified   Ace Inhibitors Other (See Comments)    cough   Statins Other (See Comments)    Myalgias     Review of Systems As per HPI  PE:    11/26/2023    3:35 PM 11/26/2023    3:28 PM 11/19/2023    3:02 PM  Vitals with BMI  Weight  106 lbs 10 oz   BMI  19.49   Systolic 182 185 829  Diastolic 70 76 83  Pulse  63      Physical Exam  Gen: Alert, well appearing.  Patient is oriented to person, place, time, and situation. She answers questions and interacts appropriately but also talks about people living in her house and gets defensive about the fact that she talks to them. Oropharynx is pink and moist mucosa. Cardiovascular: Regular rhythm and rate  without murmur. Lungs show bibasilar inspiratory crackles with decreased aeration diffusely.  Mild prolongation of expiratory phase.  No wheezing.  Breathing is nonlabored. Extremities: No cyanosis or edema.  LABS:  Last CBC Lab Results  Component Value Date   WBC 16.3 (H) 06/16/2023   HGB 12.3 06/16/2023   HCT 38.3 06/16/2023   MCV 88.5 06/16/2023   MCH 28.4 06/16/2023   RDW 12.8 06/16/2023   PLT 259 06/16/2023   Last metabolic panel Lab Results  Component Value Date   GLUCOSE 81 07/22/2023   NA 142 07/22/2023   K 3.8 07/22/2023   CL 104 07/22/2023   CO2 30 07/22/2023   BUN 13 07/22/2023   CREATININE 0.71 07/22/2023   GFR 77.10 07/22/2023   CALCIUM  8.8 07/22/2023   PHOS 3.0 06/16/2023   PROT 6.0 07/22/2023   ALBUMIN 3.8 07/22/2023   BILITOT 0.5 07/22/2023   ALKPHOS 80 07/22/2023   AST 13 07/22/2023   ALT 9 07/22/2023   ANIONGAP 9 06/16/2023   Last hemoglobin A1c Lab Results  Component Value Date   HGBA1C 5.5 07/22/2023   HGBA1C 5.5 07/22/2023   HGBA1C 5.5 (A) 07/22/2023   HGBA1C 5.5 07/22/2023   Last thyroid  functions Lab Results  Component Value Date   TSH 0.14 (L) 10/26/2023   T3TOTAL 90 12/25/2022   T4TOTAL 12.4 07/11/2010   FREET4 2.15 (H) 06/01/2023   IMPRESSION AND PLAN:  #1 chronic hypoxic respiratory failure with acute COPD exacerbation. Depo-Medrol  80 mg IM today here. Start Levaquin  500  mg a day x 7 days. Tomorrow start prednisone  40 mg a day x 5 days then 20 mg a day x 5 days. Continue albuterol  nebulizer every 6 hours as needed. Continue to liter oxygen  24/7.  #2 uncontrolled hypertension. Continue Toprol -XL 25 mg a day and today we will add irbesartan 75 mg a day (she has a remote history of losartan  use and it may have caused hyperkalemia.  This was over 10 years ago.  Her potassium is normal and her renal function is normal.)  #3 dementia with behavioral disturbance. Progressing, prominent hallucination component->? Lewy body  dementia? Needs 24/7 care.  Her boyfriend [name] is doing a great job.  #4 hypothyroidism. TSH a little bit low at last check a month ago.  Changed dosing to one half of the 100 mcg tab 2 days a week and 1 whole tab all other days of the week.  Plan repeat TSH at next follow-up in 1 week.  An After Visit Summary was printed and given to the patient.  FOLLOW UP: Return in about 1 week (around 12/03/2023) for f/u COPD.  Signed:  Gerlene Hockey, MD           11/26/2023

## 2023-11-26 NOTE — Telephone Encounter (Signed)
 Rx sent to pharmacy but unable to notify pt as VM box is full

## 2023-11-26 NOTE — Telephone Encounter (Signed)
 Copied from CRM [id number]. Topic: Clinical - Medication Question >> Nov 25, 2023  4:09 PM Devaughn RAMAN wrote: Reason for CRM: Pt friend POA [name] called in regarding patient coughing up yellow phlegm. [name] stated Dr.Alva advised for the pt to f/u if she started coughing up yellow phelgm and he would send a prescription into the pharmacy on file. [name] confirmed [employer] on file is the Physiological scientist.

## 2023-11-27 ENCOUNTER — Telehealth: Payer: Self-pay

## 2023-11-27 ENCOUNTER — Other Ambulatory Visit: Payer: Self-pay | Admitting: Family Medicine

## 2023-11-27 DIAGNOSIS — E1142 Type 2 diabetes mellitus with diabetic polyneuropathy: Secondary | ICD-10-CM

## 2023-11-27 DIAGNOSIS — F32A Depression, unspecified: Secondary | ICD-10-CM

## 2023-11-27 DIAGNOSIS — F02A3 Dementia in other diseases classified elsewhere, mild, with mood disturbance: Secondary | ICD-10-CM

## 2023-11-27 DIAGNOSIS — F02A4 Dementia in other diseases classified elsewhere, mild, with anxiety: Secondary | ICD-10-CM

## 2023-11-27 DIAGNOSIS — N189 Chronic kidney disease, unspecified: Secondary | ICD-10-CM | POA: Diagnosis not present

## 2023-11-27 DIAGNOSIS — J9611 Chronic respiratory failure with hypoxia: Secondary | ICD-10-CM | POA: Diagnosis not present

## 2023-11-27 DIAGNOSIS — D631 Anemia in chronic kidney disease: Secondary | ICD-10-CM | POA: Diagnosis not present

## 2023-11-27 DIAGNOSIS — E1122 Type 2 diabetes mellitus with diabetic chronic kidney disease: Secondary | ICD-10-CM | POA: Diagnosis not present

## 2023-11-27 DIAGNOSIS — I130 Hypertensive heart and chronic kidney disease with heart failure and stage 1 through stage 4 chronic kidney disease, or unspecified chronic kidney disease: Secondary | ICD-10-CM | POA: Diagnosis not present

## 2023-11-27 DIAGNOSIS — E039 Hypothyroidism, unspecified: Secondary | ICD-10-CM | POA: Diagnosis not present

## 2023-11-27 DIAGNOSIS — J449 Chronic obstructive pulmonary disease, unspecified: Secondary | ICD-10-CM | POA: Diagnosis not present

## 2023-11-27 DIAGNOSIS — I5032 Chronic diastolic (congestive) heart failure: Secondary | ICD-10-CM | POA: Diagnosis not present

## 2023-11-27 NOTE — Telephone Encounter (Signed)
 Received fax from [facility] Certification and POC 11/09/23 to 01/07/24  Order # [id number]

## 2023-11-27 NOTE — Telephone Encounter (Signed)
Placed on PCP desk to review and sign, if appropriate.  

## 2023-11-27 NOTE — Telephone Encounter (Signed)
 Signed and put in box to go up front. Signed:  Gerlene Hockey, MD           11/27/2023

## 2023-11-27 NOTE — Telephone Encounter (Signed)
 Forms faxed back.

## 2023-11-27 NOTE — Telephone Encounter (Signed)
 Forms picked up and placed

## 2023-11-30 ENCOUNTER — Telehealth: Payer: Self-pay

## 2023-11-30 DIAGNOSIS — J4489 Other specified chronic obstructive pulmonary disease: Secondary | ICD-10-CM

## 2023-11-30 NOTE — Telephone Encounter (Signed)
 Received message via [employer] that patient was referred to pharmacy team for new start [place] . Referral did not make it to pharmacy WQ. Will re-enter referral.   Opening benefits investigation in this thread.

## 2023-11-30 NOTE — Telephone Encounter (Signed)
 Priscilla Cantrell from [facility] called to check on then status of order faxed to the office. Advised order currently awaiting approval.

## 2023-11-30 NOTE — Telephone Encounter (Signed)
 Home health orders received 11/30/23 for Priscilla Cantrell health initiation orders: Yes.  Home health re-certification orders: No. Patient last seen by ordering physician for this condition: 11/26/23. Must be less than 90 days for re-certification and less than 30 days prior for initiation. Visit must have been for the condition the orders are being placed.  Patient meets criteria for Physician to sign orders: Yes.        Current med list has been attached: No        Orders placed on physicians desk for signature: 11/10/23 (date) If patient does not meet criteria for orders to be signed: pt was called to schedule appt. Appt is scheduled for n/a.   Priscilla Cantrell

## 2023-12-01 DIAGNOSIS — J449 Chronic obstructive pulmonary disease, unspecified: Secondary | ICD-10-CM | POA: Diagnosis not present

## 2023-12-01 DIAGNOSIS — E039 Hypothyroidism, unspecified: Secondary | ICD-10-CM | POA: Diagnosis not present

## 2023-12-01 DIAGNOSIS — N189 Chronic kidney disease, unspecified: Secondary | ICD-10-CM | POA: Diagnosis not present

## 2023-12-01 DIAGNOSIS — E1122 Type 2 diabetes mellitus with diabetic chronic kidney disease: Secondary | ICD-10-CM | POA: Diagnosis not present

## 2023-12-01 DIAGNOSIS — I5032 Chronic diastolic (congestive) heart failure: Secondary | ICD-10-CM | POA: Diagnosis not present

## 2023-12-01 DIAGNOSIS — E1142 Type 2 diabetes mellitus with diabetic polyneuropathy: Secondary | ICD-10-CM | POA: Diagnosis not present

## 2023-12-01 DIAGNOSIS — D631 Anemia in chronic kidney disease: Secondary | ICD-10-CM | POA: Diagnosis not present

## 2023-12-01 DIAGNOSIS — J9611 Chronic respiratory failure with hypoxia: Secondary | ICD-10-CM | POA: Diagnosis not present

## 2023-12-01 DIAGNOSIS — I130 Hypertensive heart and chronic kidney disease with heart failure and stage 1 through stage 4 chronic kidney disease, or unspecified chronic kidney disease: Secondary | ICD-10-CM | POA: Diagnosis not present

## 2023-12-01 NOTE — Telephone Encounter (Signed)
 Forms faxed back.

## 2023-12-01 NOTE — Telephone Encounter (Signed)
 Signed and put in box to go up front. Signed:  Gerlene Hockey, MD           12/01/2023

## 2023-12-03 ENCOUNTER — Telehealth: Payer: Self-pay

## 2023-12-03 NOTE — Telephone Encounter (Addendum)
 Copied from CRM #[id number]. Topic: Clinical - Medical Advice >> Dec 03, 2023 12:07 PM Shereese L wrote: Reason for CRM: Amy from [facility] is reporting that the patient is on blood thinners and has fallen and hit her head. Patient didn't want to go to the ER because she has an appt 10/31.   Sending as FYI  Pt is currently on Eliquis . She had a spot on the back on her head. [name] boyfriend cleaned the area and stated the area is better. ED evaluation was declined.CMA did advise him that if bleeding resumes, headache develops or major dizziness that patient will need to go to the ED.

## 2023-12-04 ENCOUNTER — Encounter: Payer: Self-pay | Admitting: Family Medicine

## 2023-12-04 ENCOUNTER — Ambulatory Visit: Admitting: Family Medicine

## 2023-12-04 VITALS — BP 130/76 | HR 71 | Temp 97.2°F | Ht 62.0 in | Wt 108.6 lb

## 2023-12-04 DIAGNOSIS — J9611 Chronic respiratory failure with hypoxia: Secondary | ICD-10-CM | POA: Diagnosis not present

## 2023-12-04 DIAGNOSIS — I5032 Chronic diastolic (congestive) heart failure: Secondary | ICD-10-CM | POA: Diagnosis not present

## 2023-12-04 DIAGNOSIS — J441 Chronic obstructive pulmonary disease with (acute) exacerbation: Secondary | ICD-10-CM | POA: Diagnosis not present

## 2023-12-04 DIAGNOSIS — E039 Hypothyroidism, unspecified: Secondary | ICD-10-CM

## 2023-12-04 DIAGNOSIS — D631 Anemia in chronic kidney disease: Secondary | ICD-10-CM | POA: Diagnosis not present

## 2023-12-04 DIAGNOSIS — J449 Chronic obstructive pulmonary disease, unspecified: Secondary | ICD-10-CM | POA: Diagnosis not present

## 2023-12-04 DIAGNOSIS — I130 Hypertensive heart and chronic kidney disease with heart failure and stage 1 through stage 4 chronic kidney disease, or unspecified chronic kidney disease: Secondary | ICD-10-CM | POA: Diagnosis not present

## 2023-12-04 DIAGNOSIS — N189 Chronic kidney disease, unspecified: Secondary | ICD-10-CM | POA: Diagnosis not present

## 2023-12-04 DIAGNOSIS — E1142 Type 2 diabetes mellitus with diabetic polyneuropathy: Secondary | ICD-10-CM | POA: Diagnosis not present

## 2023-12-04 DIAGNOSIS — E1122 Type 2 diabetes mellitus with diabetic chronic kidney disease: Secondary | ICD-10-CM | POA: Diagnosis not present

## 2023-12-04 DIAGNOSIS — W19XXXA Unspecified fall, initial encounter: Secondary | ICD-10-CM

## 2023-12-04 NOTE — Telephone Encounter (Signed)
 Noted

## 2023-12-04 NOTE — Progress Notes (Signed)
 OFFICE VISIT  12/04/2023  CC:  Chief Complaint  Patient presents with   Follow-up    Patient is a 88 y.o. female who presents for 1 week follow-up COPD exacerbation. A/P as of last visit: 1 chronic hypoxic respiratory failure with acute COPD exacerbation. Depo-Medrol  80 mg IM today here. Start Levaquin  500 mg a day x 7 days. Tomorrow start prednisone  40 mg a day x 5 days then 20 mg a day x 5 days. Continue albuterol  nebulizer every 6 hours as needed. Continue 2 liter oxygen  24/7.   #2 uncontrolled hypertension. Continue Toprol -XL 25 mg a day and today we will add irbesartan 75 mg a day (she has a remote history of losartan  use and it may have caused hyperkalemia.  This was over 10 years ago.  Her potassium is normal and her renal function is normal.)   #3 dementia with behavioral disturbance. Progressing, prominent hallucination component->? Lewy body dementia? Needs 24/7 care.  Her boyfriend [name] is doing a great job.   #4 hypothyroidism. TSH a little bit low at last check a month ago.  Changed dosing to one half of the 100 mcg tab 2 days a week and 1 whole tab all other days of the week.  Plan repeat TSH at next follow-up in 1 week.  INTERIM HX: She is improved. Less wheezing, less shortness of breath, and less cough.  No fevers.  She did fall recently when trying to get her legs out of her pants before getting in the shower--> foot got caught and she fell back and her head hit the tub.  The shower curtains did provide some padding between her head in the tub though. She had no dizziness or headache.  She had a little cut and bled just a little bit.  She has been fine since. She has not had any falls recently other than that.  Past Medical History:  Diagnosis Date   Allergic rhinitis    with upper airway cough: as of 07/2017 pulm f/u she was instructed to use astelin , flonase , and saline more consistently   BPPV (benign paroxysmal positional vertigo) 03/2018   C.  difficile colitis    Chest pain, non-cardiac    Cardiac CT showed no signif obstructive dz, mildly elevated calcium  level (Dr. Pietro).   Chronic cystitis with hematuria    Dr. Roman   Chronic diastolic heart failure (HCC)    Chronic hypoxemic respiratory failure (HCC) 02/02/2015   2L oxygen  24/7   Chronic renal insufficiency, stage 2 (mild)    GFR approx 60   Cold hands    NCS/EMGs normal.  Hand dysesthesias per neuro--reassured   COPD (chronic obstructive pulmonary disease) (HCC)    GOLD 4.  spirometry 01/10/09 FEV 0.97(52%), FEV1% 47.  With chronic bronchitis as of 07/2017.   DDD (degenerative disc disease), cervical    1998 MRI C5-6 impingemt (left).  MRI 2021 (neurol) 1 level canal stenosis, 1 level foraminal stenosis--->PT   DDD (degenerative disc disease), lumbar    Depression with anxiety 04/15/2011   Diarrheal disease Summer 2017   ? 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 Dr Luis).  As of 10/05/15 pt reports GI dx'd her with C diff and rx'd more flagyl  and she is also on cholestyramine .  As of 02/2016, pt's sx's resolved completely.   Diverticulosis of colon    Procto '97 and colonoscopy 2002   Dysplastic nevus of upper extremity 04/2014  R tricep (Dr. Livingston)   Edema of both lower extremities due to peripheral venous insufficiency    Varicosities bilat, swelling L>R.  R baker's cyst.  Got vein clinic eval 02/2018->sclerotherapy recommended but since no hemorrhage or ulceration, insurance will not cover the procedure.   Family history of adverse reaction to anesthesia    mother died when patient age 46 receiving anesthesia for thyroid  surgery   Fibrocystic breast disease    w/fibroadenoma.  Bx's showed NO atypia (Dr. Merrilyn).  Mammo neg 2008.   GERD (gastroesophageal reflux disease)    History of double vision    Ophthalmologist, Dr. Cleatus, is further evaluating this  with MRI  orbits and limited brain (myesthenia gravis testing neg 07/2010)   History of home oxygen  therapy    uses 2 liters at hs   Hypertension    EKG 03/2010 normal   Hypothyroidism    Hashimoto's, dx'd 1989   Idiopathic peripheral neuropathy 04/2018   Sensory (numb feet) S1 distribution bilat, c/w spinal stenosis sensory neuropathy (no pain). 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  Labs [place], MRI C spine->some spondylosis/stenosis.  UE NCS/EMS: normal 04/2019.   Lesion of right native kidney 2017   found on CT 02/2015.  F/u MRI 03/31/15 showed it to be smaller and less likely of any significance but repeat CT renal protocol in 97mo was recommended by radiology.  This was done 10/26/15 and showed no interval change in the lesion, but b/c the lesion enhances with contrast, radiol rec'd urol consult (b/c pap renal RCC could not be excluded).  Saw urol 03/06/16--stable on u/s 09/2016 and 09/2017.   OAB (overactive bladder)    Osteoarthritis, hip, bilateral    she is s/p bilat THA as of 02/2018.   Osteoporosis    radius, T score -2.5->rec'd alendronate  08/2019->plan rpt DEXA 08/2021.   PAF (paroxysmal atrial fibrillation) (HCC)    Pneumonia    Postmenopausal atrophic vaginitis    improved with estrace 2 X/week.   Prolapse of vaginal cuff after hysterectomy    10/2018: Dr. Louvenia. Mild colpocele 04/2019 per GYN (no surgery)   Pulmonary nodule    CT chest 12/10, June 2011.  No nodule on CT 06/2010.   Recurrent Clostridium difficile diarrhea    Episode 09/19/2016: resolved with prolonged course of flagyl .  11/2016 recurrence treated with 10d of Dificid (Fidaxomicin) by GI (Dr. Luis).   Recurrent UTI    likely related to pt's atrophic vaginitis.  Urol started vaginal estrace 03/2016.   Rib fracture 09/2018   R 7th, laterally-->sustained when her dog pulled her over and she fell.   RLS (restless legs syndrome)    neuro->gabapentin  trial  2020/21   Shingles 02/2014   R abd/flank   Spinal stenosis of lumbar region with neurogenic claudication    2008 MRI (L3-4, L4-5, L5-S1).  +S1 distribution sensory loss bilat    Past Surgical History:  Procedure Laterality Date   ABDOMINAL HYSTERECTOMY  1978   no BSO per pt--nonmalignant reasons   APPENDECTOMY     BREAST CYST EXCISION     Last screening mammogram 10/2010 was normal.   CHOLECYSTECTOMY  2001   COLONOSCOPY  04/2005; 12/06/2015   2007 NORMAL.  2017 adenomatous polyp x 1.  Mod sigmoid diverticulosis (Dr. Luis).     DEXA  05/18/2017; 08/7019   2019 NORMAL (T-score 0.1).  08/2019->T score -2.5->alendronate  started.  03/2022 T score -2.7   HIP CLOSED REDUCTION Right 12/25/2015  Procedure: CLOSED REDUCTION HIP;  Surgeon: Selinda Belvie Gosling, MD;  Location: [facility];  Service: Orthopedics;  Laterality: Right;   NCV/EMS  10/2018   chronic and symmetric sensorimotor axonal polyneuropathy affecting the lower extremities   TONSILLECTOMY     TOTAL HIP ARTHROPLASTY Right 11/01/2014   Procedure: RIGHT TOTAL HIP ARTHROPLASTY;  Surgeon: Tanda Heading, MD;  Location: [facility];  Service: Orthopedics;  Laterality: Right;   TOTAL HIP ARTHROPLASTY Left 12/08/2017   Procedure: LEFT TOTAL HIP ARTHROPLASTY ANTERIOR APPROACH;  Surgeon: Ernie Cough, MD;  Location: [facility];  Service: Orthopedics;  Laterality: Left;  70 mins   TOTAL HIP REVISION Right 02/10/2023   Procedure: RIGHT TOTAL HIP REVISION;  Surgeon: Ernie Cough, MD;  Location: [facility];  Service: Orthopedics;  Laterality: Right;   TRANSTHORACIC ECHOCARDIOGRAM  08/2014   2016 EF 60-65%, grade I diast dysfxn. 06/21/21 unchanged.  05/2023 EF 60-65%, grd I DD, sev elev pulm art pressure, severe tricusp regurg   TUBAL LIGATION  1974    Outpatient Medications Prior to Visit  Medication Sig Dispense Refill   acetaminophen  (TYLENOL ) 325 MG tablet Take 2 tablets (650 mg total) by mouth every 6 (six) hours.     albuterol  (PROVENTIL ) (2.5 MG/3ML)  0.083% nebulizer solution Take 2.5 mg by nebulization every 6 (six) hours as needed for wheezing or shortness of breath.     albuterol  (VENTOLIN  HFA) 108 (90 Base) MCG/ACT inhaler INHALE 2 PUFFS EVERY 4 HOURS AS NEEDED FOR WHEEZING OR SHORTNESS OF BREATH 17 g 0   apixaban  (ELIQUIS ) 2.5 MG TABS tablet Take 1 tablet (2.5 mg total) by mouth 2 (two) times daily. 180 tablet 3   diltiazem  (CARDIZEM  CD) 240 MG 24 hr capsule Take 1 capsule (240 mg total) by mouth daily. 30 capsule 0   fluticasone  (FLONASE ) 50 MCG/ACT nasal spray Place 2 sprays into both nostrils daily. 16 g 6   Fluticasone -Umeclidin-Vilant (TRELEGY ELLIPTA ) 100-62.5-25 MCG/ACT AEPB Inhale 1 puff into the lungs daily. 180 each 3   furosemide  (LASIX ) 20 MG tablet 1-3 tabs po every day as needed for legs swelling     guaiFENesin  (MUCINEX ) 600 MG 12 hr tablet Take 2 tablets (1,200 mg total) by mouth 2 (two) times daily. 30 tablet 0   irbesartan (AVAPRO) 75 MG tablet 1 tab po qd 30 tablet 0   levothyroxine  (SYNTHROID ) 100 MCG tablet Take 1 tablet (100 mcg total) by mouth daily. 30 tablet 1   metoprolol  succinate (TOPROL -XL) 25 MG 24 hr tablet Take 1 tablet (25 mg total) by mouth daily. 30 tablet 0   Misc. Devices (PULSE OXIMETER) MISC Check oxygen  level as needed 1 each 1   pantoprazole  (PROTONIX ) 40 MG tablet Take 1 tablet (40 mg total) by mouth 2 (two) times daily. 180 tablet 1   potassium chloride  SA (KLOR-CON  M) 20 MEQ tablet Take 1 tablet (20 mEq total) by mouth daily. 30 tablet 0   venlafaxine  (EFFEXOR ) 50 MG tablet 2 tabs po qAM and 1 tab po qhs 90 tablet 1   predniSONE  (DELTASONE ) 20 MG tablet 2 tabs po every day x 5d then 1 tab po every day x 5d (Patient not taking: Reported on 12/04/2023) 15 tablet 0   No facility-administered medications prior to visit.    Allergies  Allergen Reactions   Losartan  Other (See Comments)    hyperkalemia   Penicillins Hives, Swelling and Rash    Has patient had a PCN reaction causing immediate  rash, facial/tongue/throat swelling, SOB or lightheadedness  with hypotension: YES Has patient had a PCN reaction causing severe rash involving mucus membranes or skin necrosis: NO Has patient had a PCN reaction that required hospitalization NO Has patient had a PCN reaction occurring within the last 10 years: NO If all of the above answers are NO, then may proceed with Cephalosporin use.    Cefixime [Kdc:Cefixime] Other (See Comments)    unspecified   Gabapentin  Other (See Comments)    Feels woozy   Indocin [Indomethacin] Other (See Comments)    Painful tongue and throat   Singulair  [Montelukast  Sodium]     Chest tightness, decreased mental clarity   Sulfa Antibiotics Other (See Comments)    unspecified   Ace Inhibitors Other (See Comments)    cough   Statins Other (See Comments)    Myalgias     Review of Systems As per HPI  PE:    12/04/2023    4:10 PM 12/04/2023    4:00 PM 11/26/2023    3:35 PM  Vitals with BMI  Height  5' 2   Weight  108 lbs 10 oz   BMI  19.86   Systolic 130 152 817  Diastolic 76 79 70  Pulse  71      Physical Exam  General: Alert and well-appearing Affect is pleasant, thought and speech are lucid. Head: Right occipital region with small superficial laceration with edges well-approximated.  Minimal surrounding bruising.  No hematoma.  No tenderness to palpation. Cardiovascular: Regular rhythm and rate without murmur Lungs have just a trace of dry inspiratory crackles in both bases.  No wheezing.  Breathing is nonlabored.  Good aeration. Extremities: No edema  LABS:  Last CBC Lab Results  Component Value Date   WBC 16.3 (H) 06/16/2023   HGB 12.3 06/16/2023   HCT 38.3 06/16/2023   MCV 88.5 06/16/2023   MCH 28.4 06/16/2023   RDW 12.8 06/16/2023   PLT 259 06/16/2023   Last metabolic panel Lab Results  Component Value Date   GLUCOSE 81 07/22/2023   NA 142 07/22/2023   K 3.8 07/22/2023   CL 104 07/22/2023   CO2 30 07/22/2023    BUN 13 07/22/2023   CREATININE 0.71 07/22/2023   GFR 77.10 07/22/2023   CALCIUM  8.8 07/22/2023   PHOS 3.0 06/16/2023   PROT 6.0 07/22/2023   ALBUMIN 3.8 07/22/2023   BILITOT 0.5 07/22/2023   ALKPHOS 80 07/22/2023   AST 13 07/22/2023   ALT 9 07/22/2023   ANIONGAP 9 06/16/2023   Lab Results  Component Value Date   TSH 0.14 (L) 10/26/2023   IMPRESSION AND PLAN:  1 chronic hypoxic respiratory failure with acute COPD exacerbation. Improved.  She is status post a course of Levaquin .  She will finish out her prednisone .   Continue albuterol  nebulizer every 6 hours as needed and Trelegy Ellipta  daily. Continue 2 liter oxygen  24/7.   #2 uncontrolled hypertension. Continue Toprol -XL 25 mg a day and irbesartan 75 mg a day.  #3 hypothyroidism. TSH a little bit low at last check 5 wks ago.  Changed dosing to one half of the 100 mcg tab 2 days a week and 1 whole tab all other days of the week.  TSH today.  #4 Fall. No significant injury, fortunately. Discussed need to continually assess risks and benefits of Eliquis .  At this point she will go ahead and stay on this medicine.  An After Visit Summary was printed and given to the patient.  FOLLOW UP: Return in about  4 weeks (around 01/01/2024) for routine chronic illness f/u.  Signed:  Gerlene Hockey, MD           12/04/2023

## 2023-12-05 LAB — TSH: TSH: 1.43 m[IU]/L (ref 0.40–4.50)

## 2023-12-07 ENCOUNTER — Telehealth: Payer: Self-pay

## 2023-12-07 ENCOUNTER — Ambulatory Visit: Payer: Self-pay | Admitting: Family Medicine

## 2023-12-07 NOTE — Telephone Encounter (Signed)
 Received fax from [facility]   Order # [phone]  [place] inbox front office

## 2023-12-07 NOTE — Telephone Encounter (Signed)
 Forms faxed back.

## 2023-12-07 NOTE — Telephone Encounter (Signed)
 Home health orders received 12/07/23 for [facility] health initiation orders: Yes.  Home health re-certification orders: No. Patient last seen by ordering physician for this condition: 12/04/23. Must be less than 90 days for re-certification and less than 30 days prior for initiation. Visit must have been for the condition the orders are being placed.  Patient meets criteria for Physician to sign orders: Yes.        Current med list has been attached: No        Orders placed on physicians desk for signature: 12/07/23 (date) If patient does not meet criteria for orders to be signed: pt was called to schedule appt. Appt is scheduled for 01/04/24.    Placed on PCP desk to review and sign, if appropriate.  Caulder Wehner D Draken Farrior

## 2023-12-07 NOTE — Telephone Encounter (Signed)
 Signed and put in box to go up front. Signed:  Gerlene Hockey, MD           12/07/2023

## 2023-12-08 NOTE — Telephone Encounter (Signed)
 Received [employer]  new start paperwork. Completed form and faxed with clinicals and insurance card copy to [facility]   Phone#: [phone] Fax#: [phone]

## 2023-12-08 NOTE — Telephone Encounter (Signed)
 Received fax from [employer] - prescription form received.   Patient ID [id number]

## 2023-12-09 DIAGNOSIS — Z1231 Encounter for screening mammogram for malignant neoplasm of breast: Secondary | ICD-10-CM | POA: Diagnosis not present

## 2023-12-09 LAB — HM MAMMOGRAPHY

## 2023-12-10 DIAGNOSIS — I5032 Chronic diastolic (congestive) heart failure: Secondary | ICD-10-CM | POA: Diagnosis not present

## 2023-12-10 DIAGNOSIS — J449 Chronic obstructive pulmonary disease, unspecified: Secondary | ICD-10-CM | POA: Diagnosis not present

## 2023-12-10 DIAGNOSIS — I130 Hypertensive heart and chronic kidney disease with heart failure and stage 1 through stage 4 chronic kidney disease, or unspecified chronic kidney disease: Secondary | ICD-10-CM | POA: Diagnosis not present

## 2023-12-10 DIAGNOSIS — D631 Anemia in chronic kidney disease: Secondary | ICD-10-CM | POA: Diagnosis not present

## 2023-12-10 DIAGNOSIS — E039 Hypothyroidism, unspecified: Secondary | ICD-10-CM | POA: Diagnosis not present

## 2023-12-10 DIAGNOSIS — J9611 Chronic respiratory failure with hypoxia: Secondary | ICD-10-CM | POA: Diagnosis not present

## 2023-12-10 DIAGNOSIS — E1142 Type 2 diabetes mellitus with diabetic polyneuropathy: Secondary | ICD-10-CM | POA: Diagnosis not present

## 2023-12-10 DIAGNOSIS — N189 Chronic kidney disease, unspecified: Secondary | ICD-10-CM | POA: Diagnosis not present

## 2023-12-10 DIAGNOSIS — E1122 Type 2 diabetes mellitus with diabetic chronic kidney disease: Secondary | ICD-10-CM | POA: Diagnosis not present

## 2023-12-10 NOTE — Telephone Encounter (Signed)
 Received fax from [employer] with summary of benefits. Referral form for [place]  received. Rx will be triaged to [facility].. Once benefits investigation completed, pharmacy will reach out the patient to schedule shipment. If medication is unaffordable, patient will need to express financial hardship to be referred back to [facility] for patient assistance program pre-screening.   Patient ID: [id number] Pharmacy phone: [phone] [facility] Phone#: [phone]

## 2023-12-11 DIAGNOSIS — N189 Chronic kidney disease, unspecified: Secondary | ICD-10-CM | POA: Diagnosis not present

## 2023-12-11 DIAGNOSIS — I5032 Chronic diastolic (congestive) heart failure: Secondary | ICD-10-CM | POA: Diagnosis not present

## 2023-12-11 DIAGNOSIS — I130 Hypertensive heart and chronic kidney disease with heart failure and stage 1 through stage 4 chronic kidney disease, or unspecified chronic kidney disease: Secondary | ICD-10-CM | POA: Diagnosis not present

## 2023-12-11 DIAGNOSIS — E1122 Type 2 diabetes mellitus with diabetic chronic kidney disease: Secondary | ICD-10-CM | POA: Diagnosis not present

## 2023-12-11 DIAGNOSIS — J9611 Chronic respiratory failure with hypoxia: Secondary | ICD-10-CM | POA: Diagnosis not present

## 2023-12-11 DIAGNOSIS — E1142 Type 2 diabetes mellitus with diabetic polyneuropathy: Secondary | ICD-10-CM | POA: Diagnosis not present

## 2023-12-11 DIAGNOSIS — D631 Anemia in chronic kidney disease: Secondary | ICD-10-CM | POA: Diagnosis not present

## 2023-12-11 DIAGNOSIS — J449 Chronic obstructive pulmonary disease, unspecified: Secondary | ICD-10-CM | POA: Diagnosis not present

## 2023-12-11 DIAGNOSIS — E039 Hypothyroidism, unspecified: Secondary | ICD-10-CM | POA: Diagnosis not present

## 2023-12-12 ENCOUNTER — Other Ambulatory Visit: Payer: Self-pay | Admitting: Pulmonary Disease

## 2023-12-21 ENCOUNTER — Telehealth: Payer: Self-pay

## 2023-12-21 NOTE — Telephone Encounter (Signed)
 Received fax from [facility]   Order # [phone]   [place] inbox front office

## 2023-12-22 ENCOUNTER — Other Ambulatory Visit: Payer: Self-pay | Admitting: Family Medicine

## 2023-12-22 ENCOUNTER — Telehealth: Payer: Self-pay

## 2023-12-22 NOTE — Telephone Encounter (Signed)
Placed on PCP desk to review and sign, if appropriate.  

## 2023-12-22 NOTE — Telephone Encounter (Signed)
 Forms faxed back.

## 2023-12-22 NOTE — Telephone Encounter (Signed)
 Home health orders received 12/22/23 for [facility] health initiation orders: No.  Home health re-certification orders: Yes. Patient last seen by ordering physician for this condition: 12/04/23. Must be less than 90 days for re-certification and less than 30 days prior for initiation. Visit must have been for the condition the orders are being placed.  Patient meets criteria for Physician to sign orders: Yes.        Current med list has been attached: No        Orders placed on physicians desk for signature: 12/22/23 (date) If patient does not meet criteria for orders to be signed: pt was called to schedule appt. Appt is scheduled for n/a.   Shanda LELON Sharps

## 2023-12-22 NOTE — Telephone Encounter (Signed)
 Received fax from [facility]   Order # [phone]   [place] inbox front office

## 2023-12-22 NOTE — Telephone Encounter (Signed)
 Received fax from [facility]   Order # [phone]   [place] inbox front office  **this is a duplicate fax rec'd from 11/17**

## 2023-12-23 ENCOUNTER — Other Ambulatory Visit: Payer: Self-pay | Admitting: Family Medicine

## 2023-12-24 ENCOUNTER — Emergency Department (HOSPITAL_BASED_OUTPATIENT_CLINIC_OR_DEPARTMENT_OTHER)

## 2023-12-24 ENCOUNTER — Encounter (HOSPITAL_BASED_OUTPATIENT_CLINIC_OR_DEPARTMENT_OTHER): Payer: Self-pay | Admitting: Urology

## 2023-12-24 ENCOUNTER — Other Ambulatory Visit: Payer: Self-pay

## 2023-12-24 ENCOUNTER — Encounter: Payer: Self-pay | Admitting: Sports Medicine

## 2023-12-24 ENCOUNTER — Emergency Department (HOSPITAL_BASED_OUTPATIENT_CLINIC_OR_DEPARTMENT_OTHER)
Admission: EM | Admit: 2023-12-24 | Discharge: 2023-12-24 | Disposition: A | Discharge status: Home / Self Care | Attending: Emergency Medicine | Admitting: Emergency Medicine

## 2023-12-24 ENCOUNTER — Ambulatory Visit (INDEPENDENT_AMBULATORY_CARE_PROVIDER_SITE_OTHER): Admitting: Sports Medicine

## 2023-12-24 ENCOUNTER — Emergency Department (HOSPITAL_COMMUNITY)

## 2023-12-24 VITALS — BP 225/103 | HR 60 | Temp 97.7°F | Wt 106.0 lb

## 2023-12-24 DIAGNOSIS — Z741 Need for assistance with personal care: Secondary | ICD-10-CM | POA: Insufficient documentation

## 2023-12-24 DIAGNOSIS — Z7951 Long term (current) use of inhaled steroids: Secondary | ICD-10-CM | POA: Diagnosis not present

## 2023-12-24 DIAGNOSIS — Z7901 Long term (current) use of anticoagulants: Secondary | ICD-10-CM | POA: Insufficient documentation

## 2023-12-24 DIAGNOSIS — I110 Hypertensive heart disease with heart failure: Secondary | ICD-10-CM | POA: Insufficient documentation

## 2023-12-24 DIAGNOSIS — E039 Hypothyroidism, unspecified: Secondary | ICD-10-CM | POA: Insufficient documentation

## 2023-12-24 DIAGNOSIS — I5032 Chronic diastolic (congestive) heart failure: Secondary | ICD-10-CM | POA: Diagnosis not present

## 2023-12-24 DIAGNOSIS — F03918 Unspecified dementia, unspecified severity, with other behavioral disturbance: Secondary | ICD-10-CM

## 2023-12-24 DIAGNOSIS — R9082 White matter disease, unspecified: Secondary | ICD-10-CM | POA: Diagnosis not present

## 2023-12-24 DIAGNOSIS — I480 Paroxysmal atrial fibrillation: Secondary | ICD-10-CM | POA: Insufficient documentation

## 2023-12-24 DIAGNOSIS — I6782 Cerebral ischemia: Secondary | ICD-10-CM | POA: Diagnosis not present

## 2023-12-24 DIAGNOSIS — Z79899 Other long term (current) drug therapy: Secondary | ICD-10-CM | POA: Insufficient documentation

## 2023-12-24 DIAGNOSIS — I700 Atherosclerosis of aorta: Secondary | ICD-10-CM | POA: Diagnosis not present

## 2023-12-24 DIAGNOSIS — Z789 Other specified health status: Secondary | ICD-10-CM

## 2023-12-24 DIAGNOSIS — R4182 Altered mental status, unspecified: Secondary | ICD-10-CM | POA: Diagnosis not present

## 2023-12-24 DIAGNOSIS — R410 Disorientation, unspecified: Secondary | ICD-10-CM | POA: Insufficient documentation

## 2023-12-24 DIAGNOSIS — J449 Chronic obstructive pulmonary disease, unspecified: Secondary | ICD-10-CM | POA: Insufficient documentation

## 2023-12-24 DIAGNOSIS — F0390 Unspecified dementia without behavioral disturbance: Secondary | ICD-10-CM | POA: Diagnosis not present

## 2023-12-24 DIAGNOSIS — I160 Hypertensive urgency: Secondary | ICD-10-CM | POA: Diagnosis not present

## 2023-12-24 DIAGNOSIS — I10 Essential (primary) hypertension: Secondary | ICD-10-CM | POA: Diagnosis not present

## 2023-12-24 DIAGNOSIS — R9089 Other abnormal findings on diagnostic imaging of central nervous system: Secondary | ICD-10-CM | POA: Diagnosis not present

## 2023-12-24 DIAGNOSIS — Z743 Need for continuous supervision: Secondary | ICD-10-CM | POA: Diagnosis not present

## 2023-12-24 DIAGNOSIS — R918 Other nonspecific abnormal finding of lung field: Secondary | ICD-10-CM | POA: Diagnosis not present

## 2023-12-24 LAB — CBC WITH DIFFERENTIAL/PLATELET
Abs Immature Granulocytes: 0.04 K/uL (ref 0.00–0.07)
Basophils Absolute: 0 K/uL (ref 0.0–0.1)
Basophils Relative: 1 %
Eosinophils Absolute: 0.1 K/uL (ref 0.0–0.5)
Eosinophils Relative: 2 %
HCT: 40.5 % (ref 36.0–46.0)
Hemoglobin: 13.1 g/dL (ref 12.0–15.0)
Immature Granulocytes: 1 %
Lymphocytes Relative: 17 %
Lymphs Abs: 1.1 K/uL (ref 0.7–4.0)
MCH: 28.3 pg (ref 26.0–34.0)
MCHC: 32.3 g/dL (ref 30.0–36.0)
MCV: 87.5 fL (ref 80.0–100.0)
Monocytes Absolute: 0.6 K/uL (ref 0.1–1.0)
Monocytes Relative: 10 %
Neutro Abs: 4.7 K/uL (ref 1.7–7.7)
Neutrophils Relative %: 69 %
Platelets: 236 K/uL (ref 150–400)
RBC: 4.63 MIL/uL (ref 3.87–5.11)
RDW: 14.9 % (ref 11.5–15.5)
WBC: 6.6 K/uL (ref 4.0–10.5)
nRBC: 0 % (ref 0.0–0.2)

## 2023-12-24 LAB — COMPREHENSIVE METABOLIC PANEL WITH GFR
ALT: 13 U/L (ref 0–44)
AST: 18 U/L (ref 15–41)
Albumin: 4.4 g/dL (ref 3.5–5.0)
Alkaline Phosphatase: 97 U/L (ref 38–126)
Anion gap: 12 (ref 5–15)
BUN: 16 mg/dL (ref 8–23)
CO2: 26 mmol/L (ref 22–32)
Calcium: 9.5 mg/dL (ref 8.9–10.3)
Chloride: 105 mmol/L (ref 98–111)
Creatinine, Ser: 0.73 mg/dL (ref 0.44–1.00)
GFR, Estimated: >60 mL/min (ref >60)
Glucose, Bld: 89 mg/dL (ref 70–99)
Potassium: 3.7 mmol/L (ref 3.5–5.1)
Sodium: 142 mmol/L (ref 135–145)
Total Bilirubin: 0.6 mg/dL (ref 0.0–1.2)
Total Protein: 6.6 g/dL (ref 6.5–8.1)

## 2023-12-24 LAB — URINALYSIS, W/ REFLEX TO CULTURE (INFECTION SUSPECTED)
Bilirubin Urine: NEGATIVE
Glucose, UA: NEGATIVE mg/dL
Hgb urine dipstick: NEGATIVE
Ketones, ur: NEGATIVE mg/dL
Leukocytes,Ua: NEGATIVE
Nitrite: NEGATIVE
Protein, ur: NEGATIVE mg/dL
Specific Gravity, Urine: 1.01 (ref 1.005–1.030)
pH: 6 (ref 5.0–8.0)

## 2023-12-24 LAB — MAGNESIUM: Magnesium: 2.1 mg/dL (ref 1.7–2.4)

## 2023-12-24 LAB — TROPONIN T, HIGH SENSITIVITY
Troponin T High Sensitivity: 19 ng/L (ref 0–19)
Troponin T High Sensitivity: 19 ng/L (ref 0–19)

## 2023-12-24 MED ORDER — IRBESARTAN 150 MG PO TABS
75.0000 mg | ORAL_TABLET | Freq: Every day | ORAL | Status: DC
  Administered 2023-12-24: 75 mg via ORAL
  Filled 2023-12-24: qty 1

## 2023-12-24 MED ORDER — HYDRALAZINE HCL 20 MG/ML IJ SOLN
5.0000 mg | Freq: Once | INTRAMUSCULAR | Status: AC
  Administered 2023-12-24: 5 mg via INTRAVENOUS
  Filled 2023-12-24: qty 1

## 2023-12-24 NOTE — ED Notes (Signed)
 Attempted report to Consulting Civil Engineer at [facility], no answer.

## 2023-12-24 NOTE — Progress Notes (Signed)
 Careteam: Patient Care Team: Candise Aleene DEL, MD as PCP - General Pietro, Redell RAMAN, MD as Consulting Physician (Cardiology) Cleatus Collar, MD as Consulting Physician (Ophthalmology) Shellia Oh, MD as Consulting Physician (Pulmonary Disease) Heide Ingle, MD as Consulting Physician (Orthopedic Surgery) McKenzie, Belvie CROME, MD as Consulting Physician (Urology) Ernie Cough, MD as Consulting Physician (Orthopedic Surgery) Patty Anes, MD as Consulting Physician (Family Medicine) Tobie Tonita POUR, DO as Consulting Physician (Neurology) Fontaine, Evalene SQUIBB, MD (Inactive) as Consulting Physician (Gynecology) Clark-Burning, Delon, PA-C (Inactive) (Dermatology) Sheffield, Andrez SAUNDERS, PA-C (Inactive) as Physician Assistant (Dermatology) Aniceto Pfeiffer, PA-C as Consulting Physician (Gastroenterology) Aniceto Pfeiffer, PA-C as Physician Assistant (Gastroenterology) Livingston Rigg, MD as Consulting Physician (Dermatology) Tobie Tonita POUR, DO as Consulting Physician (Neurology)  Allergies  Allergen Reactions   Losartan  Other (See Comments)    hyperkalemia   Penicillins Hives, Swelling and Rash    Has patient had a PCN reaction causing immediate rash, facial/tongue/throat swelling, SOB or lightheadedness with hypotension: YES Has patient had a PCN reaction causing severe rash involving mucus membranes or skin necrosis: NO Has patient had a PCN reaction that required hospitalization NO Has patient had a PCN reaction occurring within the last 10 years: NO If all of the above answers are NO, then may proceed with Cephalosporin use.    Cefixime [Kdc:Cefixime] Other (See Comments)    unspecified   Gabapentin  Other (See Comments)    Feels woozy   Indocin [Indomethacin] Other (See Comments)    Painful tongue and throat   Singulair  [Montelukast  Sodium]     Chest tightness, decreased mental clarity   Sulfa Antibiotics Other (See Comments)    unspecified   Ace Inhibitors Other  (See Comments)    cough   Statins Other (See Comments)    Myalgias     No chief complaint on file.   Discussed the use of AI scribe software for clinical note transcription with the patient, who gave verbal consent to proceed.  History of Present Illness   Priscilla Cantrell is an 88 year old female with dementia and COPD who presents with acute memory loss and confusion. She is accompanied by her partner, [name] Banks, who is her primary caregiver.  Since Monday, she has experienced a significant decline in cognitive function, characterized by acute memory loss and confusion. She is unable to recognize familiar people, including family members, and does not remember basic tasks such as locating the bathroom. This is a new development, although she has had minor episodes of forgetfulness in the past. She requires assistance with all activities of daily living, including feeding, dressing, and personal hygiene. No signs of agitation, hallucinations, or talking to herself, and she has not been taking any over-the-counter medications like Benadryl  or Tylenol  PM recently.  she had a nurse visit yesterday who reported normal readings the previous day. There are no complaints of headache, dizziness, or nausea associated with the elevated blood pressure.  She has a history of COPD and uses supplemental oxygen  continuously. She has not reported any recent changes in her breathing pattern or increased shortness of breath when using oxygen . She occasionally removes her oxygen  without remembering to put it back on.  Her balance has worsened recently, although she has not experienced any falls. She does not use any assistive devices for mobility and moves independently around the house.  She has been eating well, with no issues of nausea, vomiting, or difficulty swallowing. She reports good sleep.  Pt is not able to give  the urine sample  Knows her name  Can recognise her partner Orineted to sel,  place  Cannot remember what she had for dinner     Review of Systems:  Review of Systems  Constitutional:  Negative for chills and fever.  HENT:  Negative for congestion, ear pain and sore throat.   Respiratory:  Positive for shortness of breath (chronic at baseline). Negative for cough.   Cardiovascular:  Negative for chest pain, palpitations and leg swelling.  Gastrointestinal:  Negative for abdominal pain, blood in stool, heartburn and vomiting.  Genitourinary:  Negative for dysuria.  Musculoskeletal:  Negative for falls.  Neurological:  Negative for dizziness.  Psychiatric/Behavioral:  Positive for memory loss. Negative for hallucinations.    Negative unless indicated in HPI.   Patient Active Problem List   Diagnosis Date Noted   Goals of care, counseling/discussion 06/09/2023   Palliative care encounter 06/09/2023   Acute on chronic respiratory failure with hypoxia (HCC) 06/06/2023   Pressure injury of skin 06/06/2023   Infection due to parainfluenza virus 3 06/05/2023   Viral pneumonia 06/05/2023   Multifocal pneumonia 06/04/2023   Acute diastolic CHF (congestive heart failure) (HCC) 06/02/2023   Atrial fibrillation with RVR (HCC) 06/02/2023   SVT (supraventricular tachycardia) 06/02/2023   COPD exacerbation (HCC) 06/02/2023   Sepsis due to pneumonia (HCC) 05/30/2023   Hip dislocation, right, initial encounter (HCC) 02/10/2023   Sepsis (HCC) 02/22/2022   Acute respiratory failure with hypoxia (HCC) 02/21/2022   Influenza A 02/21/2022   Shortness of breath 12/08/2019   Swelling of lower extremity 12/08/2019   Stress due to family tension 12/08/2019   Chronic cystitis with hematuria 08/15/2019   Urinary urgency 08/15/2019   Multiple closed fractures of ribs of right side 10/05/2018   Healthcare maintenance 10/05/2018   Medication management 07/29/2018   Overweight (BMI 25.0-29.9) 12/09/2017   S/P left KA 12/08/2017   S/P left THA, AA 12/08/2017   Postherpetic  neuralgia 03/07/2015   Constipation 02/28/2015   Renal mass 02/28/2015   Acute pyelonephritis 02/20/2015   Oral thrush 02/08/2015   Chronic hypoxemic respiratory failure (HCC) 02/02/2015   SOB (shortness of breath)    Essential hypertension 01/24/2015   UTI (urinary tract infection) 01/09/2015   Pain and swelling of right lower leg 01/09/2015   H/O total hip arthroplasty 11/01/2014   Spinal stenosis, lumbar region, with neurogenic claudication 01/04/2014   Osteoarthritis, hip, bilateral 07/27/2013   Sciatica associated with disorder of lumbosacral spine 04/19/2013   Atrophic vaginitis 01/12/2012   Periodic limb movement disorder 11/24/2011   Hypoxia 05/22/2011   Depression with anxiety 04/15/2011   Chronic venous insufficiency 07/29/2010   Hypothyroidism due to Hashimoto's thyroiditis 01/07/2010   SPINAL STENOSIS 01/07/2010   Allergic rhinitis 08/09/2007   COPD GOLD III B 08/09/2007   Past Medical History:  Diagnosis Date   Allergic rhinitis    with upper airway cough: as of 07/2017 pulm f/u she was instructed to use astelin , flonase , and saline more consistently   BPPV (benign paroxysmal positional vertigo) 03/2018   C. difficile colitis    Chest pain, non-cardiac    Cardiac CT showed no signif obstructive dz, mildly elevated calcium  level (Dr. Pietro).   Chronic cystitis with hematuria    Dr. Roman   Chronic diastolic heart failure (HCC)    Chronic hypoxemic respiratory failure (HCC) 02/02/2015   2L oxygen  24/7   Chronic renal insufficiency, stage 2 (mild)    GFR approx 60   Cold hands  NCS/EMGs normal.  Hand dysesthesias per neuro--reassured   COPD (chronic obstructive pulmonary disease) (HCC)    GOLD 4.  spirometry 01/10/09 FEV 0.97(52%), FEV1% 47.  With chronic bronchitis as of 07/2017.   DDD (degenerative disc disease), cervical    1998 MRI C5-6 impingemt (left).  MRI 2021 (neurol) 1 level canal stenosis, 1 level foraminal stenosis--->PT   DDD  (degenerative disc disease), lumbar    Depression with anxiety 04/15/2011   Diarrheal disease Summer 2017   ? 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 Dr Luis).  As of 10/05/15 pt reports GI dx'd her with C diff and rx'd more flagyl  and she is also on cholestyramine .  As of 02/2016, pt's sx's resolved completely.   Diverticulosis of colon    Procto '97 and colonoscopy 2002   Dysplastic nevus of upper extremity 04/2014   R tricep (Dr. Livingston)   Edema of both lower extremities due to peripheral venous insufficiency    Varicosities bilat, swelling L>R.  R baker's cyst.  Got vein clinic eval 02/2018->sclerotherapy recommended but since no hemorrhage or ulceration, insurance will not cover the procedure.   Family history of adverse reaction to anesthesia    mother died when patient age 9 receiving anesthesia for thyroid  surgery   Fibrocystic breast disease    w/fibroadenoma.  Bx's showed NO atypia (Dr. Merrilyn).  Mammo neg 2008.   GERD (gastroesophageal reflux disease)    History of double vision    Ophthalmologist, Dr. Cleatus, is further evaluating this with MRI  orbits and limited brain (myesthenia gravis testing neg 07/2010)   History of home oxygen  therapy    uses 2 liters at hs   Hypertension    EKG 03/2010 normal   Hypothyroidism    Hashimoto's, dx'd 1989   Idiopathic peripheral neuropathy 04/2018   Sensory (numb feet) S1 distribution bilat, c/w spinal stenosis sensory neuropathy (no pain). 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  Labs [place], MRI C spine->some spondylosis/stenosis.  UE NCS/EMS: normal 04/2019.   Lesion of right native kidney 2017   found on CT 02/2015.  F/u MRI 03/31/15 showed it to be smaller and less likely of any significance but repeat CT renal protocol in 109mo was recommended by radiology.  This was done 10/26/15 and  showed no interval change in the lesion, but b/c the lesion enhances with contrast, radiol rec'd urol consult (b/c pap renal RCC could not be excluded).  Saw urol 03/06/16--stable on u/s 09/2016 and 09/2017.   OAB (overactive bladder)    Osteoarthritis, hip, bilateral    she is s/p bilat THA as of 02/2018.   Osteoporosis    radius, T score -2.5->rec'd alendronate  08/2019->plan rpt DEXA 08/2021.   PAF (paroxysmal atrial fibrillation) (HCC)    Pneumonia    Postmenopausal atrophic vaginitis    improved with estrace 2 X/week.   Prolapse of vaginal cuff after hysterectomy    10/2018: Dr. Louvenia. Mild colpocele 04/2019 per GYN (no surgery)   Pulmonary nodule    CT chest 12/10, June 2011.  No nodule on CT 06/2010.   Recurrent Clostridium difficile diarrhea    Episode 09/19/2016: resolved with prolonged course of flagyl .  11/2016 recurrence treated with 10d of Dificid (Fidaxomicin) by GI (Dr. Luis).   Recurrent UTI    likely related to pt's atrophic vaginitis.  Urol started vaginal estrace 03/2016.   Rib fracture 09/2018   R 7th,  laterally-->sustained when her dog pulled her over and she fell.   RLS (restless legs syndrome)    neuro->gabapentin  trial 2020/21   Shingles 02/2014   R abd/flank   Spinal stenosis of lumbar region with neurogenic claudication    2008 MRI (L3-4, L4-5, L5-S1).  +S1 distribution sensory loss bilat   Past Surgical History:  Procedure Laterality Date   ABDOMINAL HYSTERECTOMY  1978   no BSO per pt--nonmalignant reasons   APPENDECTOMY     BREAST CYST EXCISION     Last screening mammogram 10/2010 was normal.   CHOLECYSTECTOMY  2001   COLONOSCOPY  04/2005; 12/06/2015   2007 NORMAL.  2017 adenomatous polyp x 1.  Mod sigmoid diverticulosis (Dr. Luis).     DEXA  05/18/2017; 08/7019   2019 NORMAL (T-score 0.1).  08/2019->T score -2.5->alendronate  started.  03/2022 T score -2.7   HIP CLOSED REDUCTION Right 12/25/2015   Procedure: CLOSED REDUCTION HIP;  Surgeon: Selinda Belvie Gosling,  MD;  Location: [facility];  Service: Orthopedics;  Laterality: Right;   NCV/EMS  10/2018   chronic and symmetric sensorimotor axonal polyneuropathy affecting the lower extremities   TONSILLECTOMY     TOTAL HIP ARTHROPLASTY Right 11/01/2014   Procedure: RIGHT TOTAL HIP ARTHROPLASTY;  Surgeon: Tanda Heading, MD;  Location: [facility];  Service: Orthopedics;  Laterality: Right;   TOTAL HIP ARTHROPLASTY Left 12/08/2017   Procedure: LEFT TOTAL HIP ARTHROPLASTY ANTERIOR APPROACH;  Surgeon: Ernie Cough, MD;  Location: [facility];  Service: Orthopedics;  Laterality: Left;  70 mins   TOTAL HIP REVISION Right 02/10/2023   Procedure: RIGHT TOTAL HIP REVISION;  Surgeon: Ernie Cough, MD;  Location: [facility];  Service: Orthopedics;  Laterality: Right;   TRANSTHORACIC ECHOCARDIOGRAM  08/2014   2016 EF 60-65%, grade I diast dysfxn. 06/21/21 unchanged.  05/2023 EF 60-65%, grd I DD, sev elev pulm art pressure, severe tricusp regurg   TUBAL LIGATION  1974   Social History   Tobacco Use   Smoking status: Former    Current packs/day: 0.00    Average packs/day: 1.5 packs/day for 34.0 years (51.0 ttl pk-yrs)    Types: Cigarettes    Start date: 75    Quit date: 02/03/1990    Years since quitting: 33.9   Smokeless tobacco: Never  Vaping Use   Vaping status: Never Used  Substance Use Topics   Alcohol  use: Yes    Comment: Rarely   Drug use: No   Family History  Problem Relation Age of Onset   Goiter Mother    Psoriasis Father        od liver   Cirrhosis Father    Diabetes Daughter        Type 2   Stroke Maternal Grandfather    Stroke Paternal Grandmother    Hypertension Paternal Grandmother    Hernia Paternal Grandmother    Cancer Paternal Grandfather        lung   Cancer Maternal Aunt        cancer of the stomach   Allergies  Allergen Reactions   Losartan  Other (See Comments)    hyperkalemia   Penicillins Hives, Swelling and Rash    Has patient had a PCN reaction causing immediate rash,  facial/tongue/throat swelling, SOB or lightheadedness with hypotension: YES Has patient had a PCN reaction causing severe rash involving mucus membranes or skin necrosis: NO Has patient had a PCN reaction that required hospitalization NO Has patient had a PCN reaction occurring within the last 10 years: NO  If all of the above answers are NO, then may proceed with Cephalosporin use.    Cefixime [Kdc:Cefixime] Other (See Comments)    unspecified   Gabapentin  Other (See Comments)    Feels woozy   Indocin [Indomethacin] Other (See Comments)    Painful tongue and throat   Singulair  [Montelukast  Sodium]     Chest tightness, decreased mental clarity   Sulfa Antibiotics Other (See Comments)    unspecified   Ace Inhibitors Other (See Comments)    cough   Statins Other (See Comments)    Myalgias     Medications: [name] Medications  New Prescriptions   No medications on file  Previous Medications   ACETAMINOPHEN  (TYLENOL ) 325 MG TABLET    Take 2 tablets (650 mg total) by mouth every 6 (six) hours.   ALBUTEROL  (PROVENTIL ) (2.5 MG/3ML) 0.083% NEBULIZER SOLUTION    Take 2.5 mg by nebulization every 6 (six) hours as needed for wheezing or shortness of breath.   ALBUTEROL  (VENTOLIN  HFA) 108 (90 BASE) MCG/ACT INHALER    INHALE 2 PUFFS BY MOUTH EVERY 4 HOURS AS NEEDED FOR WHEEZING OR SHORTNESS OF BREATH   APIXABAN  (ELIQUIS ) 2.5 MG TABS TABLET    Take 1 tablet (2.5 mg total) by mouth 2 (two) times daily.   DILTIAZEM  (CARDIZEM  CD) 240 MG 24 HR CAPSULE    Take 1 capsule (240 mg total) by mouth daily.   FLUTICASONE  (FLONASE ) 50 MCG/ACT NASAL SPRAY    Place 2 sprays into both nostrils daily.   FLUTICASONE -UMECLIDIN-VILANT (TRELEGY ELLIPTA ) 100-62.5-25 MCG/ACT AEPB    Inhale 1 puff into the lungs daily.   FUROSEMIDE  (LASIX ) 20 MG TABLET    1-3 tabs po every day as needed for legs swelling   GUAIFENESIN  (MUCINEX ) 600 MG 12 HR TABLET    Take 2 tablets (1,200 mg total) by mouth 2 (two) times daily.    IRBESARTAN  (AVAPRO ) 75 MG TABLET    TAKE 1 TABLET BY MOUTH EVERY DAY   LEVOTHYROXINE  (SYNTHROID ) 100 MCG TABLET    Take 1 tablet (100 mcg total) by mouth daily.   METOPROLOL  SUCCINATE (TOPROL -XL) 25 MG 24 HR TABLET    Take 1 tablet (25 mg total) by mouth daily.   MISC. DEVICES (PULSE OXIMETER) MISC    Check oxygen  level as needed   PANTOPRAZOLE  (PROTONIX ) 40 MG TABLET    TAKE 1 TABLET(40 MG) BY MOUTH TWICE DAILY   POTASSIUM CHLORIDE  SA (KLOR-CON  M) 20 MEQ TABLET    Take 1 tablet (20 mEq total) by mouth daily.   VENLAFAXINE  (EFFEXOR ) 50 MG TABLET    2 tabs po qAM and 1 tab po qhs  Modified Medications   No medications on file  Discontinued Medications   No medications on file    Physical Exam: Vitals:   12/24/23 1003 12/24/23 1024  BP: (!) 226/100 (!) 225/103  Pulse: 60   Temp: 97.7 F (36.5 C)   TempSrc: Oral   SpO2: 96%   Weight: 106 lb (48.1 kg)    Body mass index is 19.39 kg/m. BP Readings from Last 3 Encounters:  12/24/23 (!) 134/96  12/24/23 (!) 225/103  12/04/23 130/76   Wt Readings from Last 3 Encounters:  12/24/23 105 lb 13.1 oz (48 kg)  12/24/23 106 lb (48.1 kg)  12/04/23 108 lb 9.6 oz (49.3 kg)    Physical Exam Constitutional:      Appearance: Normal appearance.  HENT:     Head: Normocephalic and atraumatic.  Cardiovascular:  Rate and Rhythm: Normal rate and regular rhythm.  Pulmonary:     Effort: Pulmonary effort is normal. No respiratory distress.     Breath sounds: Normal breath sounds. No wheezing.  Abdominal:     General: Bowel sounds are normal. There is no distension.     Tenderness: There is no abdominal tenderness. There is no guarding or rebound.     Comments:    Musculoskeletal:        General: No swelling or tenderness.  Neurological:     Mental Status: She is alert. Mental status is at baseline.     Motor: No weakness.     Comments: Eomi  No nystagmus Finger nose neg Strength intact      Labs reviewed: Basic Metabolic  Panel: Recent Labs    06/12/23 0411 06/15/23 0348 06/16/23 0411 07/22/23 1422 10/26/23 1608 12/04/23 1636  NA 134* 134* 135 142  --   --   K 4.4 3.6 3.5 3.8  --   --   CL 92* 93* 95* 104  --   --   CO2 31 32 31 30  --   --   GLUCOSE 210* 142* 110* 81  --   --   BUN 45* 31* 29* 13  --   --   CREATININE 0.76 0.91 0.58 0.71  --   --   CALCIUM  8.3* 8.2* 8.2* 8.8  --   --   MG 2.5* 2.3 2.2  --   --   --   PHOS 3.8 2.5 3.0  --   --   --   TSH  --   --   --  0.14* 0.14* 1.43   Liver Function Tests: Recent Labs    05/31/23 1650 06/05/23 0404 07/22/23 1422  AST 16 22 13   ALT 24 28 9   ALKPHOS 56 64 80  BILITOT 0.8 1.0 0.5  PROT 5.1* 5.8* 6.0  ALBUMIN 2.3* 2.7* 3.8   No results for input(s): LIPASE, AMYLASE in the last 8760 hours. No results for input(s): AMMONIA in the last 8760 hours. CBC: Recent Labs    06/07/23 0349 06/09/23 0640 06/10/23 0429 06/12/23 0411 06/15/23 0348 06/16/23 0411  WBC 17.0* 29.7* 19.2* 17.2* 18.1* 16.3*  NEUTROABS 15.9* 27.9* 18.1*  --   --   --   HGB 13.5 14.2 13.3 13.4 12.5 12.3  HCT 41.5 44.6 41.0 43.1 40.1 38.3  MCV 88.5 89.7 88.7 92.1 91.3 88.5  PLT 300 279 256 214 238 259   Lipid Panel: No results for input(s): CHOL, HDL, LDLCALC, TRIG, CHOLHDL, LDLDIRECT in the last 8760 hours. TSH: Recent Labs    07/22/23 1422 10/26/23 1608 12/04/23 1636  TSH 0.14* 0.14* 1.43   A1C: Lab Results  Component Value Date   HGBA1C 5.5 07/22/2023   HGBA1C 5.5 07/22/2023   HGBA1C 5.5 (A) 07/22/2023   HGBA1C 5.5 07/22/2023    Assessment & Plan Confusion Caregiver reports increased confusion from baseline She is alert and  oriented to time and place Can recognise her partner Cannot remember what she had for dinner Pt unable to give urine sample Orders:   POCT Urinalysis Dipstick (Automated)   Urine Culture   Basic Metabolic Panel (BMET)   CBC with Differential/Platelet  Dementia with behavioral disturbance  (HCC) Worsening baseline confusion  Labs cbc, bmp UA    Essential hypertension Bp high Repeat bp still high 225/103  Pt denies chest pain, palpitations, SOB, dizziness Will send the patient to ED for further  evaluation    Chronic obstructive pulmonary disease, unspecified COPD type (HCC) No wheezing

## 2023-12-24 NOTE — ED Provider Notes (Signed)
 [facility] Provider Note   CSN: [id number] Arrival date & time: 12/24/23  1101     Patient presents with: Hypertension   Priscilla Cantrell is a 88 y.o. female.   HPI    88 year old female with a history of dementia, COPD on 2 L of oxygen  chronically, chronic diastolic heart failure, paroxysmal atrial fibrillation, hypothyroidism, hypertension who presents with concern for increased confusion.  Per family, she does have baseline confusion with her dementia, however is able to dress herself, recognize all of her family members.  Starting relatively acutely on Monday, she has been more confused, unable to dress herself, like she suddenly does not remember how to do it.  She also cannot remember the names of family, and has just been more confused than usual.  She otherwise has not had changes, no chest pain or shortness of breath, no cough, no fever, no urinary symptoms, no abdominal pain, diarrhea.  She has been eating okay.  No new medication changes.  She saw her primary care doctor today who recommended close the emergency department due to her increased confusion and hypertension.  Past Medical History:  Diagnosis Date   Allergic rhinitis    with upper airway cough: as of 07/2017 pulm f/u she was instructed to use astelin , flonase , and saline more consistently   BPPV (benign paroxysmal positional vertigo) 03/2018   C. difficile colitis    Chest pain, non-cardiac    Cardiac CT showed no signif obstructive dz, mildly elevated calcium  level (Dr. Pietro).   Chronic cystitis with hematuria    Dr. Roman   Chronic diastolic heart failure (HCC)    Chronic hypoxemic respiratory failure (HCC) 02/02/2015   2L oxygen  24/7   Chronic renal insufficiency, stage 2 (mild)    GFR approx 60   Cold hands    NCS/EMGs normal.  Hand dysesthesias per neuro--reassured   COPD (chronic obstructive pulmonary disease) (HCC)    GOLD 4.   spirometry 01/10/09 FEV 0.97(52%), FEV1% 47.  With chronic bronchitis as of 07/2017.   DDD (degenerative disc disease), cervical    1998 MRI C5-6 impingemt (left).  MRI 2021 (neurol) 1 level canal stenosis, 1 level foraminal stenosis--->PT   DDD (degenerative disc disease), lumbar    Depression with anxiety 04/15/2011   Diarrheal disease Summer 2017   ? 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 Dr Luis).  As of 10/05/15 pt reports GI dx'd her with C diff and rx'd more flagyl  and she is also on cholestyramine .  As of 02/2016, pt's sx's resolved completely.   Diverticulosis of colon    Procto '97 and colonoscopy 2002   Dysplastic nevus of upper extremity 04/2014   R tricep (Dr. Livingston)   Edema of both lower extremities due to peripheral venous insufficiency    Varicosities bilat, swelling L>R.  R baker's cyst.  Got vein clinic eval 02/2018->sclerotherapy recommended but since no hemorrhage or ulceration, insurance will not cover the procedure.   Family history of adverse reaction to anesthesia    mother died when patient age 59 receiving anesthesia for thyroid  surgery   Fibrocystic breast disease    w/fibroadenoma.  Bx's showed NO atypia (Dr. Merrilyn).  Mammo neg 2008.   GERD (gastroesophageal reflux disease)    History of double vision    Ophthalmologist, Dr. Cleatus, is further evaluating this with MRI  orbits and limited brain (myesthenia gravis testing neg  07/2010)   History of home oxygen  therapy    uses 2 liters at hs   Hypertension    EKG 03/2010 normal   Hypothyroidism    Hashimoto's, dx'd 1989   Idiopathic peripheral neuropathy 04/2018   Sensory (numb feet) S1 distribution bilat, c/w spinal stenosis sensory neuropathy (no pain). 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  Labs [place], MRI C spine->some spondylosis/stenosis.  UE NCS/EMS:  normal 04/2019.   Lesion of right native kidney 2017   found on CT 02/2015.  F/u MRI 03/31/15 showed it to be smaller and less likely of any significance but repeat CT renal protocol in 42mo was recommended by radiology.  This was done 10/26/15 and showed no interval change in the lesion, but b/c the lesion enhances with contrast, radiol rec'd urol consult (b/c pap renal RCC could not be excluded).  Saw urol 03/06/16--stable on u/s 09/2016 and 09/2017.   OAB (overactive bladder)    Osteoarthritis, hip, bilateral    she is s/p bilat THA as of 02/2018.   Osteoporosis    radius, T score -2.5->rec'd alendronate  08/2019->plan rpt DEXA 08/2021.   PAF (paroxysmal atrial fibrillation) (HCC)    Pneumonia    Postmenopausal atrophic vaginitis    improved with estrace 2 X/week.   Prolapse of vaginal cuff after hysterectomy    10/2018: Dr. Louvenia. Mild colpocele 04/2019 per GYN (no surgery)   Pulmonary nodule    CT chest 12/10, June 2011.  No nodule on CT 06/2010.   Recurrent Clostridium difficile diarrhea    Episode 09/19/2016: resolved with prolonged course of flagyl .  11/2016 recurrence treated with 10d of Dificid (Fidaxomicin) by GI (Dr. Luis).   Recurrent UTI    likely related to pt's atrophic vaginitis.  Urol started vaginal estrace 03/2016.   Rib fracture 09/2018   R 7th, laterally-->sustained when her dog pulled her over and she fell.   RLS (restless legs syndrome)    neuro->gabapentin  trial 2020/21   Shingles 02/2014   R abd/flank   Spinal stenosis of lumbar region with neurogenic claudication    2008 MRI (L3-4, L4-5, L5-S1).  +S1 distribution sensory loss bilat     Prior to Admission medications   Medication Sig Start Date End Date Taking? Authorizing Provider  acetaminophen  (TYLENOL ) 325 MG tablet Take 2 tablets (650 mg total) by mouth every 6 (six) hours. 02/14/23   Gonfa, Taye T, MD  albuterol  (PROVENTIL ) (2.5 MG/3ML) 0.083% nebulizer solution Take 2.5 mg by nebulization every 6 (six) hours as  needed for wheezing or shortness of breath.    [provider]  albuterol  (VENTOLIN  HFA) 108 (90 Base) MCG/ACT inhaler INHALE 2 PUFFS BY MOUTH EVERY 4 HOURS AS NEEDED FOR WHEEZING OR SHORTNESS OF BREATH 12/15/23   Mannam, Praveen, MD  apixaban  (ELIQUIS ) 2.5 MG TABS tablet Take 1 tablet (2.5 mg total) by mouth 2 (two) times daily. 07/22/23   McGowen, Aleene DEL, MD  diltiazem  (CARDIZEM  CD) 240 MG 24 hr capsule Take 1 capsule (240 mg total) by mouth daily. 06/18/23   Cheryle Page, MD  fluticasone  (FLONASE ) 50 MCG/ACT nasal spray Place 2 sprays into both nostrils daily. 05/28/23   McGowen, Aleene DEL, MD  Fluticasone -Umeclidin-Vilant (TRELEGY ELLIPTA ) 100-62.5-25 MCG/ACT AEPB Inhale 1 puff into the lungs daily. 05/07/23   Hope Almarie ORN, NP  furosemide  (LASIX ) 20 MG tablet 1-3 tabs po every day as needed for legs swelling 07/22/23   McGowen, Aleene DEL, MD  guaiFENesin  (MUCINEX ) 600 MG  12 hr tablet Take 2 tablets (1,200 mg total) by mouth 2 (two) times daily. 06/17/23   Cheryle Page, MD  irbesartan  (AVAPRO ) 75 MG tablet TAKE 1 TABLET BY MOUTH EVERY DAY 12/23/23   McGowen, Aleene DEL, MD  levothyroxine  (SYNTHROID ) 100 MCG tablet Take 1 tablet (100 mcg total) by mouth daily. 07/24/23   McGowen, Aleene DEL, MD  metoprolol  succinate (TOPROL -XL) 25 MG 24 hr tablet Take 1 tablet (25 mg total) by mouth daily. 06/18/23   Cheryle Page, MD  Misc. Devices (PULSE OXIMETER) MISC Check oxygen  level as needed 03/12/22   McGowen, Aleene DEL, MD  pantoprazole  (PROTONIX ) 40 MG tablet TAKE 1 TABLET(40 MG) BY MOUTH TWICE DAILY 12/22/23   McGowen, Aleene DEL, MD  potassium chloride  SA (KLOR-CON  M) 20 MEQ tablet Take 1 tablet (20 mEq total) by mouth daily. 06/18/23   Cheryle Page, MD  venlafaxine  (EFFEXOR ) 50 MG tablet 2 tabs po qAM and 1 tab po qhs 10/26/23   McGowen, Aleene DEL, MD    Allergies: Losartan , Penicillins, Cefixime [kdc:cefixime], Gabapentin , Indocin [indomethacin], Singulair  [montelukast  sodium], Sulfa antibiotics,  Ace inhibitors, and Statins    Review of Systems  Updated Vital Signs BP (!) 183/85   Pulse 64   Temp 98 F (36.7 C) (Oral)   Resp 18   Ht 5' 2 (1.575 m)   Wt 48 kg   SpO2 98%   BMI 19.35 kg/m   Physical Exam Constitutional:      General: She is not in acute distress.    Appearance: Normal appearance. She is not ill-appearing.  HENT:     Head: Normocephalic and atraumatic.  Eyes:     General: No visual field deficit.    Extraocular Movements: Extraocular movements intact.     Conjunctiva/sclera: Conjunctivae normal.     Pupils: Pupils are equal, round, and reactive to light.  Cardiovascular:     Rate and Rhythm: Normal rate and regular rhythm.     Pulses: Normal pulses.  Pulmonary:     Effort: Pulmonary effort is normal. No respiratory distress.  Musculoskeletal:        General: No swelling or tenderness.     Cervical back: Normal range of motion.  Skin:    General: Skin is warm and dry.     Findings: No erythema or rash.  Neurological:     General: No focal deficit present.     Mental Status: She is alert.     GCS: GCS eye subscore is 4. GCS verbal subscore is 5. GCS motor subscore is 6.     Cranial Nerves: No cranial nerve deficit, dysarthria or facial asymmetry.     Sensory: No sensory deficit.     Motor: No weakness or tremor.     Coordination: Coordination normal. Finger-Nose-Finger Test normal.     Gait: Gait normal.     Comments: Oriented to self Location      (all labs ordered are listed, but only abnormal results are displayed) Labs Reviewed  URINALYSIS, W/ REFLEX TO CULTURE (INFECTION SUSPECTED) - Abnormal; Notable for the following components:      Result Value   Color, Urine STRAW (*)    Bacteria, UA RARE (*)    All other components within normal limits  CBC WITH DIFFERENTIAL/PLATELET  COMPREHENSIVE METABOLIC PANEL WITH GFR  MAGNESIUM   TROPONIN T, HIGH SENSITIVITY  TROPONIN T, HIGH SENSITIVITY    EKG: None  Radiology: MR BRAIN WO  CONTRAST Result Date: 12/24/2023 EXAM: MRI BRAIN WITHOUT CONTRAST 12/24/2023  05:51:37 PM TECHNIQUE: Multiplanar multisequence MRI of the head/brain was performed without the administration of intravenous contrast. COMPARISON: Same day CT head. CLINICAL HISTORY: Mental status change, unknown cause; acute change monday,?apraxia/?CVA. FINDINGS: BRAIN AND VENTRICLES: T2 and FLAIR hyperintensity throughout the periventricular and subcortical white matter as well as within the thalami and pons suggestive of extensive chronic microvascular ischemic changes. Mild age related parenchymal volume loss. No evidence of acute infarct. No intracranial hemorrhage. No mass. No midline shift. No hydrocephalus. The sella is unremarkable. Normal flow voids. ORBITS: Bilateral lens replacements. SINUSES AND MASTOIDS: No acute abnormality. BONES AND SOFT TISSUES: Normal marrow signal. No acute soft tissue abnormality. Degenerative changes in the visualized upper cervical spine. There is at least mild spinal canal stenosis at C3-C4. Suspect at least moderate spinal canal stenosis at C4-C5. IMPRESSION: 1. No acute intracranial abnormality. 2. Extensive chronic microvascular ischemic changes. 3. Mild age-related parenchymal volume loss. 4. Degenerative changes in the visualized upper cervical spine with at least mild spinal canal stenosis at C3-C4 and suspected at least moderate spinal canal stenosis at C4-C5. Electronically signed by: Donnice Mania MD 12/24/2023 07:05 PM EST RP Workstation: [id number]   DG Chest 2 View Result Date: 12/24/2023 EXAM: 2 VIEW(S) XRAY OF THE CHEST 12/24/2023 01:12:33 PM COMPARISON: 06/05/2023 CLINICAL HISTORY: AMS, Hypertension FINDINGS: LUNGS AND PLEURA: Right middle lobe opacity is identified, best seen on the lateral projection radiograph which may reflect atelectasis, scarring, or early pneumonia. No pleural effusion. No pneumothorax. HEART AND MEDIASTINUM: Aortic calcification. No acute abnormality of  the cardiac and mediastinal silhouettes. BONES AND SOFT TISSUES: Degenerative changes of left shoulder. Healed right rib fractures. IMPRESSION: 1. Right middle lobe opacity on lateral view, indeterminate etiology including atelectasis, scarring, or early pneumonia. Electronically signed by: Waddell Calk MD 12/24/2023 01:58 PM EST RP Workstation: [id number]   CT Head Wo Contrast Result Date: 12/24/2023 EXAM: CT HEAD WITHOUT CONTRAST 12/24/2023 12:22:00 PM TECHNIQUE: CT of the head was performed without the administration of intravenous contrast. Automated exposure control, iterative reconstruction, and/or weight based adjustment of the mA/kV was utilized to reduce the radiation dose to as low as reasonably achievable. COMPARISON: CT Head 10/29/2021. CLINICAL HISTORY: 88 year old female with delirium and hypertension (systolic 225). FINDINGS: BRAIN AND VENTRICLES: No acute hemorrhage. No evidence of acute infarct. No hydrocephalus. No extra-axial collection. No mass effect or midline shift. Stable brain volume. Confluent, moderate bilateral cerebral white matter hypodensity in both hemispheres is chronic but mildly progressed since 2023. Otherwise stable and maintained gray white differentiation. Calcified atherosclerosis at the skull base is mild for age. No suspicious intracranial vascular hyperdensity. ORBITS: No acute abnormality. SINUSES: Paranasal sinuses, middle ears and mastoids remain well aerated. SOFT TISSUES AND SKULL: No acute soft tissue abnormality. No skull fracture. IMPRESSION: 1. No acute intracranial abnormality. 2. Moderate Chronic cerebral white matter disease, mildly progressed since 2023. Electronically signed by: Helayne Hurst MD 12/24/2023 12:35 PM EST RP Workstation: [id number]     Procedures   Medications Ordered in the ED  hydrALAZINE  (APRESOLINE ) injection 5 mg (5 mg Intravenous Given 12/24/23 1333)                                     88 year old female with a history of  dementia, COPD on 2 L of oxygen  chronically, chronic diastolic heart failure, paroxysmal atrial fibrillation, hypothyroidism, hypertension who presents with concern for increased confusion.  DDx includes ICH, CVA, infection, metabolic abnormalities,  medications, hypertensive encephalopathy, worsening dementia.  Labs completed personally by and interpreted by me show no evidence of UTI, no leukocytosis or anemia, no clinically significant electrolyte abnormalities, troponin within normal limits, magnesium  normal.  CT head completed shows no acute abnormalities.  Blood pressure improved after medication in ED> History and presentation overall not consistent with hypertensive encephalopathy.  Given her relatively sudden apraxia starting this Monday, will send to [facility] for MRI.     Final diagnoses:  Confusion  Difficulty dressing self  Hypertensive urgency    ED Discharge Orders     None          Dreama Longs, MD 12/25/23 1008

## 2023-12-24 NOTE — ED Triage Notes (Signed)
 Pt in wheelchair to triage, on Oxygen  2L at baseline  Was seen at pcp this am for memory loss, states BP 225/103  Was sent here  Denies pain  Takes BP meds but did not take them today prior to appointment

## 2023-12-24 NOTE — ED Notes (Signed)
 Called [facility] for transport to [facility] ED for MRI @13 :19.  Spoke with Priscilla Cantrell

## 2023-12-24 NOTE — ED Notes (Signed)
 Upon trying to DC pt, husband expressed many questions for provider. Attempted to answer any questions however husband reports still wanting to speak with provider. Provider messaged.

## 2023-12-24 NOTE — Assessment & Plan Note (Addendum)
 Bp high Repeat bp still high 225/103  Pt denies chest pain, palpitations, SOB, dizziness Will send the patient to ED for further evaluation

## 2023-12-24 NOTE — ED Notes (Signed)
Pt ambulatory to bathroom, one person assistance.  

## 2023-12-24 NOTE — ED Provider Notes (Signed)
 [place] EMERGENCY DEPARTMENT AT [facility] Provider Note   CSN: [id number] Arrival date & time: 12/24/23  1101     Patient presents with: Hypertension   Priscilla Cantrell is a 88 y.o. female.   88 year old female sent here for MRI.  She is with her husband.  Patient states that she was at that her primary care doctor's office and found of hypertension this morning.  States yesterday it was normal.  She did not take her morning medicines.  Husband states he forgot to give them to her before their appointment today.  She send did not another ER where she had some complaints of worsening confusion since Monday and has felt somewhat off balance.  Husband states she seems overall weaker and is having some difficulty walking.  Patient denies any headaches, chest pain, or any other symptoms or concerns at this time.   Hypertension Pertinent negatives include no chest pain, no abdominal pain and no shortness of breath.       Prior to Admission medications   Medication Sig Start Date End Date Taking? Authorizing Provider  acetaminophen  (TYLENOL ) 325 MG tablet Take 2 tablets (650 mg total) by mouth every 6 (six) hours. 02/14/23   Gonfa, Taye T, MD  albuterol  (PROVENTIL ) (2.5 MG/3ML) 0.083% nebulizer solution Take 2.5 mg by nebulization every 6 (six) hours as needed for wheezing or shortness of breath.    [provider]  albuterol  (VENTOLIN  HFA) 108 (90 Base) MCG/ACT inhaler INHALE 2 PUFFS BY MOUTH EVERY 4 HOURS AS NEEDED FOR WHEEZING OR SHORTNESS OF BREATH 12/15/23   Mannam, Praveen, MD  apixaban  (ELIQUIS ) 2.5 MG TABS tablet Take 1 tablet (2.5 mg total) by mouth 2 (two) times daily. 07/22/23   McGowen, Aleene DEL, MD  diltiazem  (CARDIZEM  CD) 240 MG 24 hr capsule Take 1 capsule (240 mg total) by mouth daily. 06/18/23   Cheryle Page, MD  fluticasone  (FLONASE ) 50 MCG/ACT nasal spray Place 2 sprays into both nostrils daily. 05/28/23   McGowen, Aleene DEL, MD   Fluticasone -Umeclidin-Vilant (TRELEGY ELLIPTA ) 100-62.5-25 MCG/ACT AEPB Inhale 1 puff into the lungs daily. 05/07/23   Hope Almarie ORN, NP  furosemide  (LASIX ) 20 MG tablet 1-3 tabs po every day as needed for legs swelling 07/22/23   McGowen, Aleene DEL, MD  guaiFENesin  (MUCINEX ) 600 MG 12 hr tablet Take 2 tablets (1,200 mg total) by mouth 2 (two) times daily. 06/17/23   Cheryle Page, MD  irbesartan (AVAPRO) 75 MG tablet TAKE 1 TABLET BY MOUTH EVERY DAY 12/23/23   McGowen, Aleene DEL, MD  levothyroxine  (SYNTHROID ) 100 MCG tablet Take 1 tablet (100 mcg total) by mouth daily. 07/24/23   McGowen, Aleene DEL, MD  metoprolol  succinate (TOPROL -XL) 25 MG 24 hr tablet Take 1 tablet (25 mg total) by mouth daily. 06/18/23   Cheryle Page, MD  Misc. Devices (PULSE OXIMETER) MISC Check oxygen  level as needed 03/12/22   McGowen, Aleene DEL, MD  pantoprazole  (PROTONIX ) 40 MG tablet TAKE 1 TABLET(40 MG) BY MOUTH TWICE DAILY 12/22/23   McGowen, Aleene DEL, MD  potassium chloride  SA (KLOR-CON  M) 20 MEQ tablet Take 1 tablet (20 mEq total) by mouth daily. 06/18/23   Cheryle Page, MD  venlafaxine  (EFFEXOR ) 50 MG tablet 2 tabs po qAM and 1 tab po qhs 10/26/23   McGowen, Aleene DEL, MD    Allergies: Losartan , Penicillins, Cefixime [kdc:cefixime], Gabapentin , Indocin [indomethacin], Singulair  [montelukast  sodium], Sulfa antibiotics, Ace inhibitors, and Statins    Review of Systems  Constitutional:  Negative  for chills and fever.  HENT:  Negative for ear pain and sore throat.   Eyes:  Negative for pain and visual disturbance.  Respiratory:  Negative for cough and shortness of breath.   Cardiovascular:  Negative for chest pain and palpitations.  Gastrointestinal:  Negative for abdominal pain and vomiting.  Genitourinary:  Negative for dysuria and hematuria.  Musculoskeletal:  Negative for arthralgias and back pain.  Skin:  Negative for color change and rash.  Neurological:  Positive for weakness. Negative for seizures and  syncope.  All other systems reviewed and are negative.   Updated Vital Signs BP (!) 183/85   Pulse 64   Temp 98 F (36.7 C) (Oral)   Resp 18   Ht 5' 2 (1.575 m)   Wt 48 kg   SpO2 98%   BMI 19.35 kg/m   Physical Exam Vitals and nursing note reviewed.  Constitutional:      General: She is not in acute distress.    Appearance: Normal appearance. She is well-developed. She is not ill-appearing.  HENT:     Head: Normocephalic and atraumatic.  Eyes:     Conjunctiva/sclera: Conjunctivae normal.  Cardiovascular:     Rate and Rhythm: Normal rate and regular rhythm.     Heart sounds: No murmur heard. Pulmonary:     Effort: Pulmonary effort is normal. No respiratory distress.     Breath sounds: Normal breath sounds.  Abdominal:     Palpations: Abdomen is soft.     Tenderness: There is no abdominal tenderness.  Musculoskeletal:        General: No swelling.     Cervical back: Neck supple.  Skin:    General: Skin is warm and dry.     Capillary Refill: Capillary refill takes less than 2 seconds.  Neurological:     Mental Status: She is alert.  Psychiatric:        Mood and Affect: Mood normal.     (all labs ordered are listed, but only abnormal results are displayed) Labs Reviewed  URINALYSIS, W/ REFLEX TO CULTURE (INFECTION SUSPECTED) - Abnormal; Notable for the following components:      Result Value   Color, Urine STRAW (*)    Bacteria, UA RARE (*)    All other components within normal limits  CBC WITH DIFFERENTIAL/PLATELET  COMPREHENSIVE METABOLIC PANEL WITH GFR  MAGNESIUM   TROPONIN T, HIGH SENSITIVITY  TROPONIN T, HIGH SENSITIVITY    EKG: None  Radiology: MR BRAIN WO CONTRAST Result Date: 12/24/2023 EXAM: MRI BRAIN WITHOUT CONTRAST 12/24/2023 05:51:37 PM TECHNIQUE: Multiplanar multisequence MRI of the head/brain was performed without the administration of intravenous contrast. COMPARISON: Same day CT head. CLINICAL HISTORY: Mental status change, unknown cause;  acute change monday,?apraxia/?CVA. FINDINGS: BRAIN AND VENTRICLES: T2 and FLAIR hyperintensity throughout the periventricular and subcortical white matter as well as within the thalami and pons suggestive of extensive chronic microvascular ischemic changes. Mild age related parenchymal volume loss. No evidence of acute infarct. No intracranial hemorrhage. No mass. No midline shift. No hydrocephalus. The sella is unremarkable. Normal flow voids. ORBITS: Bilateral lens replacements. SINUSES AND MASTOIDS: No acute abnormality. BONES AND SOFT TISSUES: Normal marrow signal. No acute soft tissue abnormality. Degenerative changes in the visualized upper cervical spine. There is at least mild spinal canal stenosis at C3-C4. Suspect at least moderate spinal canal stenosis at C4-C5. IMPRESSION: 1. No acute intracranial abnormality. 2. Extensive chronic microvascular ischemic changes. 3. Mild age-related parenchymal volume loss. 4. Degenerative changes in the visualized  upper cervical spine with at least mild spinal canal stenosis at C3-C4 and suspected at least moderate spinal canal stenosis at C4-C5. Electronically signed by: Donnice Mania MD 12/24/2023 07:05 PM EST RP Workstation: [id number]   DG Chest 2 View Result Date: 12/24/2023 EXAM: 2 VIEW(S) XRAY OF THE CHEST 12/24/2023 01:12:33 PM COMPARISON: 06/05/2023 CLINICAL HISTORY: AMS, Hypertension FINDINGS: LUNGS AND PLEURA: Right middle lobe opacity is identified, best seen on the lateral projection radiograph which may reflect atelectasis, scarring, or early pneumonia. No pleural effusion. No pneumothorax. HEART AND MEDIASTINUM: Aortic calcification. No acute abnormality of the cardiac and mediastinal silhouettes. BONES AND SOFT TISSUES: Degenerative changes of left shoulder. Healed right rib fractures. IMPRESSION: 1. Right middle lobe opacity on lateral view, indeterminate etiology including atelectasis, scarring, or early pneumonia. Electronically signed by: Waddell Calk MD 12/24/2023 01:58 PM EST RP Workstation: [id number]   CT Head Wo Contrast Result Date: 12/24/2023 EXAM: CT HEAD WITHOUT CONTRAST 12/24/2023 12:22:00 PM TECHNIQUE: CT of the head was performed without the administration of intravenous contrast. Automated exposure control, iterative reconstruction, and/or weight based adjustment of the mA/kV was utilized to reduce the radiation dose to as low as reasonably achievable. COMPARISON: CT Head 10/29/2021. CLINICAL HISTORY: 88 year old female with delirium and hypertension (systolic 225). FINDINGS: BRAIN AND VENTRICLES: No acute hemorrhage. No evidence of acute infarct. No hydrocephalus. No extra-axial collection. No mass effect or midline shift. Stable brain volume. Confluent, moderate bilateral cerebral white matter hypodensity in both hemispheres is chronic but mildly progressed since 2023. Otherwise stable and maintained gray white differentiation. Calcified atherosclerosis at the skull base is mild for age. No suspicious intracranial vascular hyperdensity. ORBITS: No acute abnormality. SINUSES: Paranasal sinuses, middle ears and mastoids remain well aerated. SOFT TISSUES AND SKULL: No acute soft tissue abnormality. No skull fracture. IMPRESSION: 1. No acute intracranial abnormality. 2. Moderate Chronic cerebral white matter disease, mildly progressed since 2023. Electronically signed by: Helayne Hurst MD 12/24/2023 12:35 PM EST RP Workstation: [id number]     Procedures   Medications Ordered in the ED  irbesartan  (AVAPRO ) tablet 75 mg (75 mg Oral Given 12/24/23 1639)  hydrALAZINE  (APRESOLINE ) injection 5 mg (5 mg Intravenous Given 12/24/23 1333)                                    Medical Decision Making Social determinants of health: History of dementia  Patient here with her husband.  She was transferred from under ER for MRI patient had some confusion that started earlier this week.  She does have a history of dementia and has been states  that since Monday she has been more confused about who people are.  She also did not take her blood pressure medication this morning.  I gave her a dose in the ER and her blood pressure did improve.  She is fairly asymptomatic otherwise.  Lab workup reviewed by me and unremarkable.  MRI ordered and reviewed and shows no acute abnormality but chronic degenerative changes.  Advise very close follow-up with primary care.  Advised husband to call the office tomorrow morning to make an appointment for next week, and to continue the patient's medications as prescribed.  Advised return for any new or worsening symptoms.  Patient feels comfortable with the plan to be discharged home.  I discussed all results and plan with patient and family at bedside.  Problems Addressed: Confusion: acute illness or injury Difficulty dressing self: acute  illness or injury Hypertensive urgency: acute illness or injury  Amount and/or Complexity of Data Reviewed External Data Reviewed: notes.    Details: Prior ED records reviewed and patient was sent here for MRI Labs: ordered. Decision-making details documented in ED Course.    Details: Ordered and reviewed by me and unremarkable Radiology: ordered and independent interpretation performed. Decision-making details documented in ED Course.    Details: Ordered and reviewed by me MRI shows age-related changes but no acute abnormality  Risk OTC drugs. Prescription drug management. Diagnosis or treatment significantly limited by social determinants of health.     Final diagnoses:  Confusion  Difficulty dressing self  Hypertensive urgency    ED Discharge Orders     None          Gennaro Duwaine CROME, DO 12/24/23 2148

## 2023-12-24 NOTE — ED Notes (Signed)
 [facility] at bedside

## 2023-12-24 NOTE — Discharge Instructions (Addendum)
 Take your medications as prescribed.  Call and follow-up with your primary care doctor in 1 to 2 weeks.

## 2023-12-25 ENCOUNTER — Telehealth: Payer: Self-pay

## 2023-12-25 DIAGNOSIS — D631 Anemia in chronic kidney disease: Secondary | ICD-10-CM | POA: Diagnosis not present

## 2023-12-25 DIAGNOSIS — I130 Hypertensive heart and chronic kidney disease with heart failure and stage 1 through stage 4 chronic kidney disease, or unspecified chronic kidney disease: Secondary | ICD-10-CM | POA: Diagnosis not present

## 2023-12-25 DIAGNOSIS — J449 Chronic obstructive pulmonary disease, unspecified: Secondary | ICD-10-CM | POA: Diagnosis not present

## 2023-12-25 DIAGNOSIS — I5032 Chronic diastolic (congestive) heart failure: Secondary | ICD-10-CM | POA: Diagnosis not present

## 2023-12-25 DIAGNOSIS — E1142 Type 2 diabetes mellitus with diabetic polyneuropathy: Secondary | ICD-10-CM | POA: Diagnosis not present

## 2023-12-25 DIAGNOSIS — E039 Hypothyroidism, unspecified: Secondary | ICD-10-CM | POA: Diagnosis not present

## 2023-12-25 DIAGNOSIS — N189 Chronic kidney disease, unspecified: Secondary | ICD-10-CM | POA: Diagnosis not present

## 2023-12-25 DIAGNOSIS — E1122 Type 2 diabetes mellitus with diabetic chronic kidney disease: Secondary | ICD-10-CM | POA: Diagnosis not present

## 2023-12-25 DIAGNOSIS — J9611 Chronic respiratory failure with hypoxia: Secondary | ICD-10-CM | POA: Diagnosis not present

## 2023-12-25 NOTE — Telephone Encounter (Signed)
 Home health orders received 12/25/23 for [facility] initiation orders: Yes.  Home health re-certification orders: No. Patient last seen by ordering physician for this condition: 12/04/23. Must be less than 90 days for re-certification and less than 30 days prior for initiation. Visit must have been for the condition the orders are being placed.  Patient meets criteria for Physician to sign orders: Yes.        Current med list has been attached: No        Orders placed on physicians desk for signature: 12/25/23 (date) If patient does not meet criteria for orders to be signed: pt was called to schedule appt. Appt is scheduled for 12/30/23.   Placed on PCP desk to review and sign, if appropriate.   Priscilla Cantrell

## 2023-12-25 NOTE — Telephone Encounter (Signed)
 Received fax from [facility]   Order # [phone]   McGowen inbox front office

## 2023-12-29 DIAGNOSIS — N189 Chronic kidney disease, unspecified: Secondary | ICD-10-CM | POA: Diagnosis not present

## 2023-12-29 DIAGNOSIS — J449 Chronic obstructive pulmonary disease, unspecified: Secondary | ICD-10-CM | POA: Diagnosis not present

## 2023-12-29 DIAGNOSIS — E1142 Type 2 diabetes mellitus with diabetic polyneuropathy: Secondary | ICD-10-CM | POA: Diagnosis not present

## 2023-12-29 DIAGNOSIS — I130 Hypertensive heart and chronic kidney disease with heart failure and stage 1 through stage 4 chronic kidney disease, or unspecified chronic kidney disease: Secondary | ICD-10-CM | POA: Diagnosis not present

## 2023-12-29 DIAGNOSIS — E1122 Type 2 diabetes mellitus with diabetic chronic kidney disease: Secondary | ICD-10-CM | POA: Diagnosis not present

## 2023-12-29 DIAGNOSIS — E039 Hypothyroidism, unspecified: Secondary | ICD-10-CM | POA: Diagnosis not present

## 2023-12-29 DIAGNOSIS — I5032 Chronic diastolic (congestive) heart failure: Secondary | ICD-10-CM | POA: Diagnosis not present

## 2023-12-29 DIAGNOSIS — J9611 Chronic respiratory failure with hypoxia: Secondary | ICD-10-CM | POA: Diagnosis not present

## 2023-12-29 DIAGNOSIS — D631 Anemia in chronic kidney disease: Secondary | ICD-10-CM | POA: Diagnosis not present

## 2023-12-30 ENCOUNTER — Ambulatory Visit: Admitting: Family Medicine

## 2023-12-30 NOTE — Progress Notes (Deleted)
 OFFICE VISIT  12/30/2023  CC: No chief complaint on file.   Patient is a 88 y.o. female who presents for 6-day emergency department follow-up for confusion and severe elevation of blood pressure.  HPI: Dr. Sherlynn saw her 12/24/2023 in the office for acute worsening of memory loss and confusion.  Blood pressure at that time was 225/103.  She was sent to the emergency department for further evaluation. Her blood pressure was brought under control in the emergency department and MRI brain showed no acute abnormality, chronic degenerative changes noted.  Past Medical History:  Diagnosis Date   Allergic rhinitis    with upper airway cough: as of 07/2017 pulm f/u she was instructed to use astelin , flonase , and saline more consistently   BPPV (benign paroxysmal positional vertigo) 03/2018   C. difficile colitis    Chest pain, non-cardiac    Cardiac CT showed no signif obstructive dz, mildly elevated calcium  level (Dr. Pietro).   Chronic cystitis with hematuria    Dr. Roman   Chronic diastolic heart failure (HCC)    Chronic hypoxemic respiratory failure (HCC) 02/02/2015   2L oxygen  24/7   Chronic renal insufficiency, stage 2 (mild)    GFR approx 60   Cold hands    NCS/EMGs normal.  Hand dysesthesias per neuro--reassured   COPD (chronic obstructive pulmonary disease) (HCC)    GOLD 4.  spirometry 01/10/09 FEV 0.97(52%), FEV1% 47.  With chronic bronchitis as of 07/2017.   DDD (degenerative disc disease), cervical    1998 MRI C5-6 impingemt (left).  MRI 2021 (neurol) 1 level canal stenosis, 1 level foraminal stenosis--->PT   DDD (degenerative disc disease), lumbar    Depression with anxiety 04/15/2011   Diarrheal disease Summer 2017   ? 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 Dr Luis).  As of 10/05/15 pt reports GI dx'd her with C diff and rx'd more flagyl  and she is also on  cholestyramine .  As of 02/2016, pt's sx's resolved completely.   Diverticulosis of colon    Procto '97 and colonoscopy 2002   Dysplastic nevus of upper extremity 04/2014   R tricep (Dr. Livingston)   Edema of both lower extremities due to peripheral venous insufficiency    Varicosities bilat, swelling L>R.  R baker's cyst.  Got vein clinic eval 02/2018->sclerotherapy recommended but since no hemorrhage or ulceration, insurance will not cover the procedure.   Family history of adverse reaction to anesthesia    mother died when patient age 10 receiving anesthesia for thyroid  surgery   Fibrocystic breast disease    w/fibroadenoma.  Bx's showed NO atypia (Dr. Merrilyn).  Mammo neg 2008.   GERD (gastroesophageal reflux disease)    History of double vision    Ophthalmologist, Dr. Cleatus, is further evaluating this with MRI  orbits and limited brain (myesthenia gravis testing neg 07/2010)   History of home oxygen  therapy    uses 2 liters at hs   Hypertension    EKG 03/2010 normal   Hypothyroidism    Hashimoto's, dx'd 1989   Idiopathic peripheral neuropathy 04/2018   Sensory (numb feet) S1 distribution bilat, c/w spinal stenosis sensory neuropathy (no pain). 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  Labs [place], MRI C spine->some spondylosis/stenosis.  UE NCS/EMS: normal 04/2019.   Lesion of right native kidney 2017   found on CT 02/2015.  F/u MRI 03/31/15 showed it to be smaller and less likely  of any significance but repeat CT renal protocol in 67mo was recommended by radiology.  This was done 10/26/15 and showed no interval change in the lesion, but b/c the lesion enhances with contrast, radiol rec'd urol consult (b/c pap renal RCC could not be excluded).  Saw urol 03/06/16--stable on u/s 09/2016 and 09/2017.   OAB (overactive bladder)    Osteoarthritis, hip, bilateral    she is s/p bilat THA as of 02/2018.   Osteoporosis    radius, T score  -2.5->rec'd alendronate  08/2019->plan rpt DEXA 08/2021.   PAF (paroxysmal atrial fibrillation) (HCC)    Pneumonia    Postmenopausal atrophic vaginitis    improved with estrace 2 X/week.   Prolapse of vaginal cuff after hysterectomy    10/2018: Dr. Louvenia. Mild colpocele 04/2019 per GYN (no surgery)   Pulmonary nodule    CT chest 12/10, June 2011.  No nodule on CT 06/2010.   Recurrent Clostridium difficile diarrhea    Episode 09/19/2016: resolved with prolonged course of flagyl .  11/2016 recurrence treated with 10d of Dificid (Fidaxomicin) by GI (Dr. Luis).   Recurrent UTI    likely related to pt's atrophic vaginitis.  Urol started vaginal estrace 03/2016.   Rib fracture 09/2018   R 7th, laterally-->sustained when her dog pulled her over and she fell.   RLS (restless legs syndrome)    neuro->gabapentin  trial 2020/21   Shingles 02/2014   R abd/flank   Spinal stenosis of lumbar region with neurogenic claudication    2008 MRI (L3-4, L4-5, L5-S1).  +S1 distribution sensory loss bilat    Past Surgical History:  Procedure Laterality Date   ABDOMINAL HYSTERECTOMY  1978   no BSO per pt--nonmalignant reasons   APPENDECTOMY     BREAST CYST EXCISION     Last screening mammogram 10/2010 was normal.   CHOLECYSTECTOMY  2001   COLONOSCOPY  04/2005; 12/06/2015   2007 NORMAL.  2017 adenomatous polyp x 1.  Mod sigmoid diverticulosis (Dr. Luis).     DEXA  05/18/2017; 08/7019   2019 NORMAL (T-score 0.1).  08/2019->T score -2.5->alendronate  started.  03/2022 T score -2.7   HIP CLOSED REDUCTION Right 12/25/2015   Procedure: CLOSED REDUCTION HIP;  Surgeon: Selinda Belvie Gosling, MD;  Location: [facility];  Service: Orthopedics;  Laterality: Right;   NCV/EMS  10/2018   chronic and symmetric sensorimotor axonal polyneuropathy affecting the lower extremities   TONSILLECTOMY     TOTAL HIP ARTHROPLASTY Right 11/01/2014   Procedure: RIGHT TOTAL HIP ARTHROPLASTY;  Surgeon: Tanda Heading, MD;  Location: [facility];   Service: Orthopedics;  Laterality: Right;   TOTAL HIP ARTHROPLASTY Left 12/08/2017   Procedure: LEFT TOTAL HIP ARTHROPLASTY ANTERIOR APPROACH;  Surgeon: Ernie Cough, MD;  Location: [facility];  Service: Orthopedics;  Laterality: Left;  70 mins   TOTAL HIP REVISION Right 02/10/2023   Procedure: RIGHT TOTAL HIP REVISION;  Surgeon: Ernie Cough, MD;  Location: [facility];  Service: Orthopedics;  Laterality: Right;   TRANSTHORACIC ECHOCARDIOGRAM  08/2014   2016 EF 60-65%, grade I diast dysfxn. 06/21/21 unchanged.  05/2023 EF 60-65%, grd I DD, sev elev pulm art pressure, severe tricusp regurg   TUBAL LIGATION  1974    Outpatient Medications Prior to Visit  Medication Sig Dispense Refill   acetaminophen  (TYLENOL ) 325 MG tablet Take 2 tablets (650 mg total) by mouth every 6 (six) hours.     albuterol  (PROVENTIL ) (2.5 MG/3ML) 0.083% nebulizer solution Take 2.5 mg by nebulization every 6 (six) hours as needed for  wheezing or shortness of breath.     albuterol  (VENTOLIN  HFA) 108 (90 Base) MCG/ACT inhaler INHALE 2 PUFFS BY MOUTH EVERY 4 HOURS AS NEEDED FOR WHEEZING OR SHORTNESS OF BREATH 17 g 3   apixaban  (ELIQUIS ) 2.5 MG TABS tablet Take 1 tablet (2.5 mg total) by mouth 2 (two) times daily. 180 tablet 3   diltiazem  (CARDIZEM  CD) 240 MG 24 hr capsule Take 1 capsule (240 mg total) by mouth daily. 30 capsule 0   fluticasone  (FLONASE ) 50 MCG/ACT nasal spray Place 2 sprays into both nostrils daily. 16 g 6   Fluticasone -Umeclidin-Vilant (TRELEGY ELLIPTA ) 100-62.5-25 MCG/ACT AEPB Inhale 1 puff into the lungs daily. 180 each 3   furosemide  (LASIX ) 20 MG tablet 1-3 tabs po every day as needed for legs swelling     guaiFENesin  (MUCINEX ) 600 MG 12 hr tablet Take 2 tablets (1,200 mg total) by mouth 2 (two) times daily. 30 tablet 0   irbesartan  (AVAPRO ) 75 MG tablet TAKE 1 TABLET BY MOUTH EVERY DAY 30 tablet 0   levothyroxine  (SYNTHROID ) 100 MCG tablet Take 1 tablet (100 mcg total) by mouth daily. 30 tablet 1   metoprolol   succinate (TOPROL -XL) 25 MG 24 hr tablet Take 1 tablet (25 mg total) by mouth daily. 30 tablet 0   Misc. Devices (PULSE OXIMETER) MISC Check oxygen  level as needed 1 each 1   pantoprazole  (PROTONIX ) 40 MG tablet TAKE 1 TABLET(40 MG) BY MOUTH TWICE DAILY 180 tablet 1   potassium chloride  SA (KLOR-CON  M) 20 MEQ tablet Take 1 tablet (20 mEq total) by mouth daily. 30 tablet 0   venlafaxine  (EFFEXOR ) 50 MG tablet 2 tabs po qAM and 1 tab po qhs 90 tablet 1   No facility-administered medications prior to visit.    Allergies  Allergen Reactions   Losartan  Other (See Comments)    hyperkalemia   Penicillins Hives, Swelling and Rash    Has patient had a PCN reaction causing immediate rash, facial/tongue/throat swelling, SOB or lightheadedness with hypotension: YES Has patient had a PCN reaction causing severe rash involving mucus membranes or skin necrosis: NO Has patient had a PCN reaction that required hospitalization NO Has patient had a PCN reaction occurring within the last 10 years: NO If all of the above answers are NO, then may proceed with Cephalosporin use.    Cefixime [Kdc:Cefixime] Other (See Comments)    unspecified   Gabapentin  Other (See Comments)    Feels woozy   Indocin [Indomethacin] Other (See Comments)    Painful tongue and throat   Singulair  [Montelukast  Sodium]     Chest tightness, decreased mental clarity   Sulfa Antibiotics Other (See Comments)    unspecified   Ace Inhibitors Other (See Comments)    cough   Statins Other (See Comments)    Myalgias     Review of Systems As per HPI  PE:    12/24/2023    9:08 PM 12/24/2023    6:20 PM 12/24/2023    3:38 PM  Vitals with BMI  Systolic 183 176 815  Diastolic 85 95 88  Pulse 64 63 64     Physical Exam  ***  LABS:  Last CBC Lab Results  Component Value Date   WBC 6.6 12/24/2023   HGB 13.1 12/24/2023   HCT 40.5 12/24/2023   MCV 87.5 12/24/2023   MCH 28.3 12/24/2023   RDW 14.9 12/24/2023   PLT  236 12/24/2023   Last metabolic panel Lab Results  Component Value Date  GLUCOSE 89 12/24/2023   NA 142 12/24/2023   K 3.7 12/24/2023   CL 105 12/24/2023   CO2 26 12/24/2023   BUN 16 12/24/2023   CREATININE 0.73 12/24/2023   GFRNONAA >60 12/24/2023   CALCIUM  9.5 12/24/2023   PHOS 3.0 06/16/2023   PROT 6.6 12/24/2023   ALBUMIN 4.4 12/24/2023   BILITOT 0.6 12/24/2023   ALKPHOS 97 12/24/2023   AST 18 12/24/2023   ALT 13 12/24/2023   ANIONGAP 12 12/24/2023   Lab Results  Component Value Date   TSH 1.43 12/04/2023   IMPRESSION AND PLAN:  No problem-specific Assessment & Plan notes found for this encounter.  No labs needed?  An After Visit Summary was printed and given to the patient.  FOLLOW UP: No follow-ups on file.  Signed:  Gerlene Hockey, MD           12/30/2023

## 2024-01-04 ENCOUNTER — Ambulatory Visit (HOSPITAL_BASED_OUTPATIENT_CLINIC_OR_DEPARTMENT_OTHER)
Admission: RE | Admit: 2024-01-04 | Discharge: 2024-01-04 | Disposition: A | Source: Ambulatory Visit | Attending: Family Medicine | Admitting: Family Medicine

## 2024-01-04 ENCOUNTER — Ambulatory Visit: Admitting: Family Medicine

## 2024-01-04 ENCOUNTER — Encounter: Payer: Self-pay | Admitting: Family Medicine

## 2024-01-04 VITALS — BP 116/78 | HR 76 | Temp 97.3°F | Ht 62.0 in | Wt 107.4 lb

## 2024-01-04 DIAGNOSIS — S46212A Strain of muscle, fascia and tendon of other parts of biceps, left arm, initial encounter: Secondary | ICD-10-CM | POA: Diagnosis not present

## 2024-01-04 DIAGNOSIS — R0989 Other specified symptoms and signs involving the circulatory and respiratory systems: Secondary | ICD-10-CM | POA: Diagnosis not present

## 2024-01-04 DIAGNOSIS — G8929 Other chronic pain: Secondary | ICD-10-CM

## 2024-01-04 DIAGNOSIS — M12812 Other specific arthropathies, not elsewhere classified, left shoulder: Secondary | ICD-10-CM

## 2024-01-04 DIAGNOSIS — M25512 Pain in left shoulder: Secondary | ICD-10-CM

## 2024-01-04 DIAGNOSIS — F03918 Unspecified dementia, unspecified severity, with other behavioral disturbance: Secondary | ICD-10-CM

## 2024-01-04 DIAGNOSIS — M19012 Primary osteoarthritis, left shoulder: Secondary | ICD-10-CM | POA: Diagnosis not present

## 2024-01-04 NOTE — Patient Instructions (Signed)
 No medication changes today.

## 2024-01-04 NOTE — Progress Notes (Signed)
 OFFICE VISIT  01/04/2024  CC:  Chief Complaint  Patient presents with   Medical Management of Chronic Issues    Patient is a 88 y.o. female who presents for 10-day follow-up altered mental status and uncontrolled hypertension.  She was sent from our office to the emergency department.  Hydralazine  5 mg IV was given. CT head negative acute. MRI brain showed no acute intracranial abnormality.  Extensive chronic microvascular ischemic changes noted. Urinalysis normal.  CBC and complete metabolic panel normal.  Troponins normal.  Magnesium  normal. Chest x-ray showed a right middle lobe opacity on lateral view, indeterminate etiology including atelectasis, scarring, or early pneumonia. She was discharged home without any new medications or changes in old medications.  INTERIM HX: She is still struggling more gradually with her memory and having periods of confusion and taking her oxygen  off and wandering outside of her house if she is not monitored by her boyfriend. These things have become her cognitive and behavioral baseline.  Her main concern today is gradually worsening chronic left shoulder pain. No recent injury. Hurts all over.  No neck pain.  The pain goes over the deltoid and proximal biceps but nothing further down the arm.  No arm paresthesias or weakness.  She has only mild intermittent pain in the right shoulder. Right shoulder x-ray from 12/11/2020 shows mild glenohumeral osteoarthritis. There is no imaging of the left shoulder in the EMR.  ROS as above, plus--> no fevers, no CP, no SOB, no wheezing, no cough, no dizziness, no HAs, no rashes, no melena/hematochezia.  No polyuria or polydipsia.  No joint swelling.  No focal weakness, paresthesias, or tremors.  No acute vision or hearing abnormalities.  No dysuria or unusual/new urinary urgency or frequency.  No recent changes in lower legs. No n/v/d or abd pain.  No palpitations.    Past Medical History:  Diagnosis Date    Allergic rhinitis    with upper airway cough: as of 07/2017 pulm f/u she was instructed to use astelin , flonase , and saline more consistently   BPPV (benign paroxysmal positional vertigo) 03/2018   C. difficile colitis    Chest pain, non-cardiac    Cardiac CT showed no signif obstructive dz, mildly elevated calcium  level (Dr. Pietro).   Chronic cystitis with hematuria    Dr. Roman   Chronic diastolic heart failure (HCC)    Chronic hypoxemic respiratory failure (HCC) 02/02/2015   2L oxygen  24/7   Chronic renal insufficiency, stage 2 (mild)    GFR approx 60   Cold hands    NCS/EMGs normal.  Hand dysesthesias per neuro--reassured   COPD (chronic obstructive pulmonary disease) (HCC)    GOLD 4.  spirometry 01/10/09 FEV 0.97(52%), FEV1% 47.  With chronic bronchitis as of 07/2017.   DDD (degenerative disc disease), cervical    1998 MRI C5-6 impingemt (left).  MRI 2021 (neurol) 1 level canal stenosis, 1 level foraminal stenosis--->PT   DDD (degenerative disc disease), lumbar    Depression with anxiety 04/15/2011   Diarrheal disease Summer 2017   ? 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 Dr Luis).  As of 10/05/15 pt reports GI dx'd her with C diff and rx'd more flagyl  and she is also on cholestyramine .  As of 02/2016, pt's sx's resolved completely.   Diverticulosis of colon    Procto '97 and colonoscopy 2002   Dysplastic nevus of upper extremity 04/2014   R tricep (Dr. Livingston)  Edema of both lower extremities due to peripheral venous insufficiency    Varicosities bilat, swelling L>R.  R baker's cyst.  Got vein clinic eval 02/2018->sclerotherapy recommended but since no hemorrhage or ulceration, insurance will not cover the procedure.   Family history of adverse reaction to anesthesia    mother died when patient age 5 receiving anesthesia for thyroid  surgery   Fibrocystic breast disease     w/fibroadenoma.  Bx's showed NO atypia (Dr. Merrilyn).  Mammo neg 2008.   GERD (gastroesophageal reflux disease)    History of double vision    Ophthalmologist, Dr. Cleatus, is further evaluating this with MRI  orbits and limited brain (myesthenia gravis testing neg 07/2010)   History of home oxygen  therapy    uses 2 liters at hs   Hypertension    EKG 03/2010 normal   Hypothyroidism    Hashimoto's, dx'd 1989   Idiopathic peripheral neuropathy 04/2018   Sensory (numb feet) S1 distribution bilat, c/w spinal stenosis sensory neuropathy (no pain). 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  Labs Stanton, MRI C spine->some spondylosis/stenosis.  UE NCS/EMS: normal 04/2019.   Lesion of right native kidney 2017   found on CT 02/2015.  F/u MRI 03/31/15 showed it to be smaller and less likely of any significance but repeat CT renal protocol in 63mo was recommended by radiology.  This was done 10/26/15 and showed no interval change in the lesion, but b/c the lesion enhances with contrast, radiol rec'd urol consult (b/c pap renal RCC could not be excluded).  Saw urol 03/06/16--stable on u/s 09/2016 and 09/2017.   OAB (overactive bladder)    Osteoarthritis, hip, bilateral    she is s/p bilat THA as of 02/2018.   Osteoporosis    radius, T score -2.5->rec'd alendronate  08/2019->plan rpt DEXA 08/2021.   PAF (paroxysmal atrial fibrillation) (HCC)    Pneumonia    Postmenopausal atrophic vaginitis    improved with estrace 2 X/week.   Prolapse of vaginal cuff after hysterectomy    10/2018: Dr. Louvenia. Mild colpocele 04/2019 per GYN (no surgery)   Pulmonary nodule    CT chest 12/10, June 2011.  No nodule on CT 06/2010.   Recurrent Clostridium difficile diarrhea    Episode 09/19/2016: resolved with prolonged course of flagyl .  11/2016 recurrence treated with 10d of Dificid (Fidaxomicin) by GI (Dr. Luis).   Recurrent UTI    likely related to pt's atrophic vaginitis.   Urol started vaginal estrace 03/2016.   Rib fracture 09/2018   R 7th, laterally-->sustained when her dog pulled her over and she fell.   RLS (restless legs syndrome)    neuro->gabapentin  trial 2020/21   Shingles 02/2014   R abd/flank   Spinal stenosis of lumbar region with neurogenic claudication    2008 MRI (L3-4, L4-5, L5-S1).  +S1 distribution sensory loss bilat    Past Surgical History:  Procedure Laterality Date   ABDOMINAL HYSTERECTOMY  1978   no BSO per pt--nonmalignant reasons   APPENDECTOMY     BREAST CYST EXCISION     Last screening mammogram 10/2010 was normal.   CHOLECYSTECTOMY  2001   COLONOSCOPY  04/2005; 12/06/2015   2007 NORMAL.  2017 adenomatous polyp x 1.  Mod sigmoid diverticulosis (Dr. Luis).     DEXA  05/18/2017; 08/7019   2019 NORMAL (T-score 0.1).  08/2019->T score -2.5->alendronate  started.  03/2022 T score -2.7   HIP CLOSED REDUCTION Right 12/25/2015   Procedure: CLOSED REDUCTION HIP;  Surgeon:  Selinda Belvie Gosling, MD;  Location: [facility];  Service: Orthopedics;  Laterality: Right;   NCV/EMS  10/2018   chronic and symmetric sensorimotor axonal polyneuropathy affecting the lower extremities   TONSILLECTOMY     TOTAL HIP ARTHROPLASTY Right 11/01/2014   Procedure: RIGHT TOTAL HIP ARTHROPLASTY;  Surgeon: Tanda Heading, MD;  Location: [facility];  Service: Orthopedics;  Laterality: Right;   TOTAL HIP ARTHROPLASTY Left 12/08/2017   Procedure: LEFT TOTAL HIP ARTHROPLASTY ANTERIOR APPROACH;  Surgeon: Ernie Cough, MD;  Location: [facility];  Service: Orthopedics;  Laterality: Left;  70 mins   TOTAL HIP REVISION Right 02/10/2023   Procedure: RIGHT TOTAL HIP REVISION;  Surgeon: Ernie Cough, MD;  Location: [facility];  Service: Orthopedics;  Laterality: Right;   TRANSTHORACIC ECHOCARDIOGRAM  08/2014   2016 EF 60-65%, grade I diast dysfxn. 06/21/21 unchanged.  05/2023 EF 60-65%, grd I DD, sev elev pulm art pressure, severe tricusp regurg   TUBAL LIGATION  1974    Outpatient  Medications Prior to Visit  Medication Sig Dispense Refill   acetaminophen  (TYLENOL ) 325 MG tablet Take 2 tablets (650 mg total) by mouth every 6 (six) hours.     albuterol  (PROVENTIL ) (2.5 MG/3ML) 0.083% nebulizer solution Take 2.5 mg by nebulization every 6 (six) hours as needed for wheezing or shortness of breath.     albuterol  (VENTOLIN  HFA) 108 (90 Base) MCG/ACT inhaler INHALE 2 PUFFS BY MOUTH EVERY 4 HOURS AS NEEDED FOR WHEEZING OR SHORTNESS OF BREATH 17 g 3   apixaban  (ELIQUIS ) 2.5 MG TABS tablet Take 1 tablet (2.5 mg total) by mouth 2 (two) times daily. 180 tablet 3   diltiazem  (CARDIZEM  CD) 240 MG 24 hr capsule Take 1 capsule (240 mg total) by mouth daily. 30 capsule 0   fluticasone  (FLONASE ) 50 MCG/ACT nasal spray Place 2 sprays into both nostrils daily. 16 g 6   Fluticasone -Umeclidin-Vilant (TRELEGY ELLIPTA ) 100-62.5-25 MCG/ACT AEPB Inhale 1 puff into the lungs daily. 180 each 3   furosemide  (LASIX ) 20 MG tablet 1-3 tabs po every day as needed for legs swelling     guaiFENesin  (MUCINEX ) 600 MG 12 hr tablet Take 2 tablets (1,200 mg total) by mouth 2 (two) times daily. 30 tablet 0   irbesartan  (AVAPRO ) 75 MG tablet TAKE 1 TABLET BY MOUTH EVERY DAY 30 tablet 0   levothyroxine  (SYNTHROID ) 100 MCG tablet Take 1 tablet (100 mcg total) by mouth daily. 30 tablet 1   metoprolol  succinate (TOPROL -XL) 25 MG 24 hr tablet Take 1 tablet (25 mg total) by mouth daily. 30 tablet 0   Misc. Devices (PULSE OXIMETER) MISC Check oxygen  level as needed 1 each 1   pantoprazole  (PROTONIX ) 40 MG tablet TAKE 1 TABLET(40 MG) BY MOUTH TWICE DAILY 180 tablet 1   potassium chloride  SA (KLOR-CON  M) 20 MEQ tablet Take 1 tablet (20 mEq total) by mouth daily. 30 tablet 0   venlafaxine  (EFFEXOR ) 50 MG tablet 2 tabs po qAM and 1 tab po qhs 90 tablet 1   No facility-administered medications prior to visit.    Allergies  Allergen Reactions   Losartan  Other (See Comments)    hyperkalemia   Penicillins Hives, Swelling  and Rash    Has patient had a PCN reaction causing immediate rash, facial/tongue/throat swelling, SOB or lightheadedness with hypotension: YES Has patient had a PCN reaction causing severe rash involving mucus membranes or skin necrosis: NO Has patient had a PCN reaction that required hospitalization NO Has patient had a PCN reaction occurring  within the last 10 years: NO If all of the above answers are NO, then may proceed with Cephalosporin use.    Cefixime [Kdc:Cefixime] Other (See Comments)    unspecified   Gabapentin  Other (See Comments)    Feels woozy   Indocin [Indomethacin] Other (See Comments)    Painful tongue and throat   Singulair  [Montelukast  Sodium]     Chest tightness, decreased mental clarity   Sulfa Antibiotics Other (See Comments)    unspecified   Ace Inhibitors Other (See Comments)    cough   Statins Other (See Comments)    Myalgias     Review of Systems As per HPI  PE:    01/04/2024    2:40 PM 01/04/2024    2:29 PM 12/24/2023    9:08 PM  Vitals with BMI  Height  5' 2   Weight  107 lbs 6 oz   BMI  19.64   Systolic 116 154 816  Diastolic 78 83 85  Pulse  76 64  02 sat 87% on 2L oxygen  after walking to room  Physical Exam  General: Alert, no distress. Oriented to person, place, situation. Left shoulder with mild discomfort to palpation anterolaterally.  No AC joint tenderness.  Hawkins positive, Neer's and speeds positive.   LABS:  Last CBC Lab Results  Component Value Date   WBC 6.6 12/24/2023   HGB 13.1 12/24/2023   HCT 40.5 12/24/2023   MCV 87.5 12/24/2023   MCH 28.3 12/24/2023   RDW 14.9 12/24/2023   PLT 236 12/24/2023   Last metabolic panel Lab Results  Component Value Date   GLUCOSE 89 12/24/2023   NA 142 12/24/2023   K 3.7 12/24/2023   CL 105 12/24/2023   CO2 26 12/24/2023   BUN 16 12/24/2023   CREATININE 0.73 12/24/2023   GFRNONAA >60 12/24/2023   CALCIUM  9.5 12/24/2023   PHOS 3.0 06/16/2023   PROT 6.6 12/24/2023    ALBUMIN 4.4 12/24/2023   BILITOT 0.6 12/24/2023   ALKPHOS 97 12/24/2023   AST 18 12/24/2023   ALT 13 12/24/2023   ANIONGAP 12 12/24/2023   Lab Results  Component Value Date   TSH 1.43 12/04/2023   IMPRESSION AND PLAN:  #1 hypertension, labile. Currently well-controlled on irbesartan  75 mg daily and Toprol -XL 25 mg daily.  #2 dementia.  Recent acute worsening has resolved. Reassured.  #3 chronic left shoulder pain-> suspect combination of rotator cuff pathology as well as osteoarthritis. ([facility] ultrasound today consistent with complete supraspinatus tear, effusion and biceps tendon sheath.)  Will check radiographs of the left shoulder. Will do right shoulder ultrasound for comparison at next follow-up in a few days, at which time we will consider left shoulder steroid injection.  An After Visit Summary was printed and given to the patient.  FOLLOW UP: No follow-ups on file.  Signed:  Gerlene Hockey, MD           01/04/2024

## 2024-01-05 ENCOUNTER — Ambulatory Visit: Payer: Self-pay | Admitting: Family Medicine

## 2024-01-05 ENCOUNTER — Telehealth: Payer: Self-pay

## 2024-01-05 ENCOUNTER — Encounter: Payer: Self-pay | Admitting: Family Medicine

## 2024-01-05 DIAGNOSIS — I130 Hypertensive heart and chronic kidney disease with heart failure and stage 1 through stage 4 chronic kidney disease, or unspecified chronic kidney disease: Secondary | ICD-10-CM | POA: Diagnosis not present

## 2024-01-05 DIAGNOSIS — I5032 Chronic diastolic (congestive) heart failure: Secondary | ICD-10-CM | POA: Diagnosis not present

## 2024-01-05 DIAGNOSIS — E1142 Type 2 diabetes mellitus with diabetic polyneuropathy: Secondary | ICD-10-CM | POA: Diagnosis not present

## 2024-01-05 DIAGNOSIS — N189 Chronic kidney disease, unspecified: Secondary | ICD-10-CM | POA: Diagnosis not present

## 2024-01-05 NOTE — Telephone Encounter (Signed)
 Copied from CRM [id number]. Topic: Clinical - Lab/Test Results >> Jan 05, 2024  2:35 PM Mercedes MATSU wrote: Reason for CRM: Patient called in to go over patients xray results. Patient states she can be reached at [phone].

## 2024-01-05 NOTE — Telephone Encounter (Signed)
 Spoke with patient regarding results/recommendations.

## 2024-01-07 ENCOUNTER — Telehealth: Payer: Self-pay

## 2024-01-07 NOTE — Telephone Encounter (Signed)
 Received fax from [facility]   Client Coordination Note Report   [place] inbox front office

## 2024-01-07 NOTE — Telephone Encounter (Signed)
Placed on PCP desk to review   

## 2024-01-08 ENCOUNTER — Ambulatory Visit: Admitting: Family Medicine

## 2024-01-08 ENCOUNTER — Encounter: Payer: Self-pay | Admitting: Family Medicine

## 2024-01-08 VITALS — BP 163/79 | HR 64 | Temp 97.7°F | Ht 62.0 in | Wt 108.6 lb

## 2024-01-08 DIAGNOSIS — M12812 Other specific arthropathies, not elsewhere classified, left shoulder: Secondary | ICD-10-CM

## 2024-01-08 DIAGNOSIS — G8929 Other chronic pain: Secondary | ICD-10-CM

## 2024-01-08 MED ORDER — METHYLPREDNISOLONE ACETATE 80 MG/ML IJ SUSP: 40.0000 mg | Freq: Once | INTRAMUSCULAR | Status: DC

## 2024-01-08 MED ORDER — METHYLPREDNISOLONE ACETATE 80 MG/ML IJ SUSP
40.0000 mg | Freq: Once | INTRAMUSCULAR | Status: AC
  Administered 2024-01-08: 40 mg via INTRA_ARTICULAR

## 2024-01-08 NOTE — Addendum Note (Signed)
 Addended by: FLETA CARE D on: 01/08/2024 04:49 PM   Modules accepted: Orders

## 2024-01-08 NOTE — Progress Notes (Signed)
 OFFICE VISIT  01/08/2024  CC:  Chief Complaint  Patient presents with   Follow-up    Shoulder pain; injection today    Patient is a 88 y.o. female who presents for 4-day follow-up left shoulder pain. On 01/04/2024 I saw her for chronic progressive left shoulder pain. Suspect multifactorial--> rotator cuff tear, degenerative osteoarthritis of AC and GH joints.  INTERIM HX: Left shoulder x-ray 01/04/2024 showed severe glenohumeral joint osteoarthritis.  Moderate AC joint space loss.  No change in chronic severe left shoulder pain.  Past Medical History:  Diagnosis Date   Allergic rhinitis    with upper airway cough: as of 07/2017 pulm f/u she was instructed to use astelin , flonase , and saline more consistently   Arthritis of left shoulder    01/2024   BPPV (benign paroxysmal positional vertigo) 03/2018   C. difficile colitis    Chest pain, non-cardiac    Cardiac CT showed no signif obstructive dz, mildly elevated calcium  level (Dr. Pietro).   Chronic cystitis with hematuria    Dr. Roman   Chronic diastolic heart failure (HCC)    Chronic hypoxemic respiratory failure (HCC) 02/02/2015   2L oxygen  24/7   Chronic renal insufficiency, stage 2 (mild)    GFR approx 60   Cold hands    NCS/EMGs normal.  Hand dysesthesias per neuro--reassured   COPD (chronic obstructive pulmonary disease) (HCC)    GOLD 4.  spirometry 01/10/09 FEV 0.97(52%), FEV1% 47.  With chronic bronchitis as of 07/2017.   DDD (degenerative disc disease), cervical    1998 MRI C5-6 impingemt (left).  MRI 2021 (neurol) 1 level canal stenosis, 1 level foraminal stenosis--->PT   DDD (degenerative disc disease), lumbar    Depression with anxiety 04/15/2011   Diarrheal disease Summer 2017   ? 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 Dr Luis).  As of 10/05/15 pt reports GI dx'd her with C diff and rx'd more flagyl  and  she is also on cholestyramine .  As of 02/2016, pt's sx's resolved completely.   Diverticulosis of colon    Procto '97 and colonoscopy 2002   Dysplastic nevus of upper extremity 04/2014   R tricep (Dr. Livingston)   Edema of both lower extremities due to peripheral venous insufficiency    Varicosities bilat, swelling L>R.  R baker's cyst.  Got vein clinic eval 02/2018->sclerotherapy recommended but since no hemorrhage or ulceration, insurance will not cover the procedure.   Family history of adverse reaction to anesthesia    mother died when patient age 12 receiving anesthesia for thyroid  surgery   Fibrocystic breast disease    w/fibroadenoma.  Bx's showed NO atypia (Dr. Merrilyn).  Mammo neg 2008.   GERD (gastroesophageal reflux disease)    History of double vision    Ophthalmologist, Dr. Cleatus, is further evaluating this with MRI  orbits and limited brain (myesthenia gravis testing neg 07/2010)   History of home oxygen  therapy    uses 2 liters at hs   Hypertension    EKG 03/2010 normal   Hypothyroidism    Hashimoto's, dx'd 1989   Idiopathic peripheral neuropathy 04/2018   Sensory (numb feet) S1 distribution bilat, c/w spinal stenosis sensory neuropathy (no pain). 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  Labs [place], MRI C spine->some spondylosis/stenosis.  UE NCS/EMS: normal 04/2019.   Lesion of right native kidney 2017   found on CT 02/2015.  F/u MRI 03/31/15 showed  it to be smaller and less likely of any significance but repeat CT renal protocol in 70mo was recommended by radiology.  This was done 10/26/15 and showed no interval change in the lesion, but b/c the lesion enhances with contrast, radiol rec'd urol consult (b/c pap renal RCC could not be excluded).  Saw urol 03/06/16--stable on u/s 09/2016 and 09/2017.   OAB (overactive bladder)    Osteoarthritis, hip, bilateral    she is s/p bilat THA as of 02/2018.   Osteoporosis    radius, T  score -2.5->rec'd alendronate  08/2019->plan rpt DEXA 08/2021.   PAF (paroxysmal atrial fibrillation) (HCC)    Pneumonia    Postmenopausal atrophic vaginitis    improved with estrace 2 X/week.   Prolapse of vaginal cuff after hysterectomy    10/2018: Dr. Louvenia. Mild colpocele 04/2019 per GYN (no surgery)   Pulmonary nodule    CT chest 12/10, June 2011.  No nodule on CT 06/2010.   Recurrent Clostridium difficile diarrhea    Episode 09/19/2016: resolved with prolonged course of flagyl .  11/2016 recurrence treated with 10d of Dificid (Fidaxomicin) by GI (Dr. Luis).   Recurrent UTI    likely related to pt's atrophic vaginitis.  Urol started vaginal estrace 03/2016.   Rib fracture 09/2018   R 7th, laterally-->sustained when her dog pulled her over and she fell.   RLS (restless legs syndrome)    neuro->gabapentin  trial 2020/21   Shingles 02/2014   R abd/flank   Spinal stenosis of lumbar region with neurogenic claudication    2008 MRI (L3-4, L4-5, L5-S1).  +S1 distribution sensory loss bilat    Past Surgical History:  Procedure Laterality Date   ABDOMINAL HYSTERECTOMY  1978   no BSO per pt--nonmalignant reasons   APPENDECTOMY     BREAST CYST EXCISION     Last screening mammogram 10/2010 was normal.   CHOLECYSTECTOMY  2001   COLONOSCOPY  04/2005; 12/06/2015   2007 NORMAL.  2017 adenomatous polyp x 1.  Mod sigmoid diverticulosis (Dr. Luis).     DEXA  05/18/2017; 08/7019   2019 NORMAL (T-score 0.1).  08/2019->T score -2.5->alendronate  started.  03/2022 T score -2.7   HIP CLOSED REDUCTION Right 12/25/2015   Procedure: CLOSED REDUCTION HIP;  Surgeon: Selinda Belvie Gosling, MD;  Location: [facility];  Service: Orthopedics;  Laterality: Right;   NCV/EMS  10/2018   chronic and symmetric sensorimotor axonal polyneuropathy affecting the lower extremities   TONSILLECTOMY     TOTAL HIP ARTHROPLASTY Right 11/01/2014   Procedure: RIGHT TOTAL HIP ARTHROPLASTY;  Surgeon: Tanda Heading, MD;  Location: [facility];   Service: Orthopedics;  Laterality: Right;   TOTAL HIP ARTHROPLASTY Left 12/08/2017   Procedure: LEFT TOTAL HIP ARTHROPLASTY ANTERIOR APPROACH;  Surgeon: Ernie Cough, MD;  Location: [facility];  Service: Orthopedics;  Laterality: Left;  70 mins   TOTAL HIP REVISION Right 02/10/2023   Procedure: RIGHT TOTAL HIP REVISION;  Surgeon: Ernie Cough, MD;  Location: [facility];  Service: Orthopedics;  Laterality: Right;   TRANSTHORACIC ECHOCARDIOGRAM  08/2014   2016 EF 60-65%, grade I diast dysfxn. 06/21/21 unchanged.  05/2023 EF 60-65%, grd I DD, sev elev pulm art pressure, severe tricusp regurg   TUBAL LIGATION  1974    Outpatient Medications Prior to Visit  Medication Sig Dispense Refill   acetaminophen  (TYLENOL ) 325 MG tablet Take 2 tablets (650 mg total) by mouth every 6 (six) hours.     albuterol  (PROVENTIL ) (2.5 MG/3ML) 0.083% nebulizer solution Take 2.5 mg by nebulization  every 6 (six) hours as needed for wheezing or shortness of breath.     albuterol  (VENTOLIN  HFA) 108 (90 Base) MCG/ACT inhaler INHALE 2 PUFFS BY MOUTH EVERY 4 HOURS AS NEEDED FOR WHEEZING OR SHORTNESS OF BREATH 17 g 3   apixaban  (ELIQUIS ) 2.5 MG TABS tablet Take 1 tablet (2.5 mg total) by mouth 2 (two) times daily. 180 tablet 3   diltiazem  (CARDIZEM  CD) 240 MG 24 hr capsule Take 1 capsule (240 mg total) by mouth daily. 30 capsule 0   fluticasone  (FLONASE ) 50 MCG/ACT nasal spray Place 2 sprays into both nostrils daily. 16 g 6   Fluticasone -Umeclidin-Vilant (TRELEGY ELLIPTA ) 100-62.5-25 MCG/ACT AEPB Inhale 1 puff into the lungs daily. 180 each 3   furosemide  (LASIX ) 20 MG tablet 1-3 tabs po every day as needed for legs swelling     guaiFENesin  (MUCINEX ) 600 MG 12 hr tablet Take 2 tablets (1,200 mg total) by mouth 2 (two) times daily. 30 tablet 0   irbesartan  (AVAPRO ) 75 MG tablet TAKE 1 TABLET BY MOUTH EVERY DAY 30 tablet 0   levothyroxine  (SYNTHROID ) 100 MCG tablet Take 1 tablet (100 mcg total) by mouth daily. 30 tablet 1   metoprolol   succinate (TOPROL -XL) 25 MG 24 hr tablet Take 1 tablet (25 mg total) by mouth daily. 30 tablet 0   Misc. Devices (PULSE OXIMETER) MISC Check oxygen  level as needed 1 each 1   pantoprazole  (PROTONIX ) 40 MG tablet TAKE 1 TABLET(40 MG) BY MOUTH TWICE DAILY 180 tablet 1   potassium chloride  SA (KLOR-CON  M) 20 MEQ tablet Take 1 tablet (20 mEq total) by mouth daily. 30 tablet 0   venlafaxine  (EFFEXOR ) 50 MG tablet 2 tabs po qAM and 1 tab po qhs 90 tablet 1   No facility-administered medications prior to visit.    Allergies  Allergen Reactions   Losartan  Other (See Comments)    hyperkalemia   Penicillins Hives, Swelling and Rash    Has patient had a PCN reaction causing immediate rash, facial/tongue/throat swelling, SOB or lightheadedness with hypotension: YES Has patient had a PCN reaction causing severe rash involving mucus membranes or skin necrosis: NO Has patient had a PCN reaction that required hospitalization NO Has patient had a PCN reaction occurring within the last 10 years: NO If all of the above answers are NO, then may proceed with Cephalosporin use.    Cefixime [Kdc:Cefixime] Other (See Comments)    unspecified   Gabapentin  Other (See Comments)    Feels woozy   Indocin [Indomethacin] Other (See Comments)    Painful tongue and throat   Singulair  [Montelukast  Sodium]     Chest tightness, decreased mental clarity   Sulfa Antibiotics Other (See Comments)    unspecified   Ace Inhibitors Other (See Comments)    cough   Statins Other (See Comments)    Myalgias     Review of Systems As per HPI  PE:    01/08/2024    4:05 PM 01/08/2024    4:00 PM 01/04/2024    2:40 PM  Vitals with BMI  Height  5' 2   Weight  108 lbs 10 oz   BMI  19.86   Systolic 163 156 883  Diastolic 79 86 78  Pulse 64 93      Physical Exam  Gen: alert, well appearing L shoulder w/out swelling or erythema. ROM limited in all motions d/t pain.  LABS:  Last CBC Lab Results  Component Value  Date   WBC 6.6 12/24/2023  HGB 13.1 12/24/2023   HCT 40.5 12/24/2023   MCV 87.5 12/24/2023   MCH 28.3 12/24/2023   RDW 14.9 12/24/2023   PLT 236 12/24/2023   Last metabolic panel Lab Results  Component Value Date   GLUCOSE 89 12/24/2023   NA 142 12/24/2023   K 3.7 12/24/2023   CL 105 12/24/2023   CO2 26 12/24/2023   BUN 16 12/24/2023   CREATININE 0.73 12/24/2023   GFRNONAA >60 12/24/2023   CALCIUM  9.5 12/24/2023   PHOS 3.0 06/16/2023   PROT 6.6 12/24/2023   ALBUMIN 4.4 12/24/2023   BILITOT 0.6 12/24/2023   ALKPHOS 97 12/24/2023   AST 18 12/24/2023   ALT 13 12/24/2023   ANIONGAP 12 12/24/2023   IMPRESSION AND PLAN:  Chronic progressive left shoulder pain due to severe glenohumeral osteoarthritis. Patient elected to proceed with steroid injection today.  Ultrasound-guided injection is preferred based on studies that show increased duration, increased effect, greater accuracy, decreased procedural pain, increased response rate, and decreased cost with ultrasound-guided versus blind injection. Procedure: Real-time ultrasound guided injection of left shoulder glenohumeral joint. Device: GE [employer] informed consent obtained.  Timeout conducted.  No overlying erythema, induration, or other signs of local infection. After sterile prep with Betadine , injected a mixture of 40 mg Depo-Medrol  and 3 mL of 1% plain lidocaine .  Injectate seen filling shoulder capsule. Patient tolerated the procedure well.  No immediate complications.  Post-injection care discussed. Advised to call if fever/chills, erythema, drainage, or persistent bleeding.  Impression: Technically successful ultrasound-guided injection.  An After Visit Summary was printed and given to the patient.  FOLLOW UP: Return in about 4 weeks (around 02/05/2024) for routine chronic illness f/u.  Signed:  Gerlene Hockey, MD           01/08/2024

## 2024-01-12 ENCOUNTER — Telehealth: Payer: Self-pay

## 2024-01-12 NOTE — Telephone Encounter (Signed)
 Received fax from [facility]   Order #  [phone]   [place] inbox front office

## 2024-01-13 ENCOUNTER — Ambulatory Visit: Admitting: Family Medicine

## 2024-01-17 ENCOUNTER — Other Ambulatory Visit: Payer: Self-pay | Admitting: Family Medicine

## 2024-01-21 ENCOUNTER — Other Ambulatory Visit: Payer: Self-pay | Admitting: Family Medicine

## 2024-02-08 ENCOUNTER — Ambulatory Visit (INDEPENDENT_AMBULATORY_CARE_PROVIDER_SITE_OTHER): Admitting: Family Medicine

## 2024-02-08 ENCOUNTER — Encounter: Payer: Self-pay | Admitting: Family Medicine

## 2024-02-08 VITALS — BP 133/83 | HR 68 | Temp 97.6°F | Ht 62.0 in | Wt 106.4 lb

## 2024-02-08 DIAGNOSIS — R0989 Other specified symptoms and signs involving the circulatory and respiratory systems: Secondary | ICD-10-CM | POA: Diagnosis not present

## 2024-02-08 DIAGNOSIS — G8929 Other chronic pain: Secondary | ICD-10-CM

## 2024-02-08 DIAGNOSIS — M19012 Primary osteoarthritis, left shoulder: Secondary | ICD-10-CM

## 2024-02-08 DIAGNOSIS — J441 Chronic obstructive pulmonary disease with (acute) exacerbation: Secondary | ICD-10-CM

## 2024-02-08 DIAGNOSIS — M25512 Pain in left shoulder: Secondary | ICD-10-CM | POA: Diagnosis not present

## 2024-02-08 MED ORDER — PREDNISONE 10 MG PO TABS: ORAL_TABLET | ORAL | 0 refills | Status: AC

## 2024-02-08 NOTE — Progress Notes (Signed)
 OFFICE VISIT  02/08/2024  CC:  Chief Complaint  Patient presents with   Medical Management of Chronic Issues    Pt reports the last 2 days, she has been using Biofreeze; received shoulder inj 12/5.     Patient is a 89 y.o. female who presents for 1 mo f/u chronic left shoulder pain, copd,and labile HTN.  INTERIM HX: About 2 weeks ago she started to get nasal congestion, runny nose, postnasal drip, and cough.  Went to urgent care-->Doxy, tamiflu , and tessalon  rx'd/taken, not much improvement regarding rattly cough, particularly in afternoon and night. No signif SOB or wheezing.  No fevers or bodyaches.  No change in chronic oxygen  requirement, 3L.  I did steroid injection into LEFT GH joint 1 mo ago.  This did not help her pain or functioning all that much.  She uses Biofreeze and Tylenol .  She says today is a good day and it did not hurt at all.  ROS as above, plus--> no CP, no dizziness, no HAs, no rashes, no melena/hematochezia.  No polyuria or polydipsia.  No focal weakness, paresthesias, or tremors.  No acute vision or hearing abnormalities.  No dysuria or unusual/new urinary urgency or frequency.  No recent changes in lower legs. No n/v/d or abd pain.  No palpitations.    Past Medical History:  Diagnosis Date   Allergic rhinitis    with upper airway cough: as of 07/2017 pulm f/u she was instructed to use astelin , flonase , and saline more consistently   Arthritis of left shoulder    01/2024   BPPV (benign paroxysmal positional vertigo) 03/2018   C. difficile colitis    Chest pain, non-cardiac    Cardiac CT showed no signif obstructive dz, mildly elevated calcium  level (Dr. Pietro).   Chronic cystitis with hematuria    Dr. Roman   Chronic diastolic heart failure (HCC)    Chronic hypoxemic respiratory failure (HCC) 02/02/2015   2L oxygen  24/7   Chronic renal insufficiency, stage 2 (mild)    GFR approx 60   Cold hands    NCS/EMGs normal.  Hand dysesthesias per  neuro--reassured   COPD (chronic obstructive pulmonary disease) (HCC)    GOLD 4.  spirometry 01/10/09 FEV 0.97(52%), FEV1% 47.  With chronic bronchitis as of 07/2017.   DDD (degenerative disc disease), cervical    1998 MRI C5-6 impingemt (left).  MRI 2021 (neurol) 1 level canal stenosis, 1 level foraminal stenosis--->PT   DDD (degenerative disc disease), lumbar    Depression with anxiety 04/15/2011   Diarrheal disease Summer 2017   ? 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 Dr Luis).  As of 10/05/15 pt reports GI dx'd her with C diff and rx'd more flagyl  and she is also on cholestyramine .  As of 02/2016, pt's sx's resolved completely.   Diverticulosis of colon    Procto '97 and colonoscopy 2002   Dysplastic nevus of upper extremity 04/2014   R tricep (Dr. Livingston)   Edema of both lower extremities due to peripheral venous insufficiency    Varicosities bilat, swelling L>R.  R baker's cyst.  Got vein clinic eval 02/2018->sclerotherapy recommended but since no hemorrhage or ulceration, insurance will not cover the procedure.   Family history of adverse reaction to anesthesia    mother died when patient age 14 receiving anesthesia for thyroid  surgery   Fibrocystic breast disease    w/fibroadenoma.  Bx's showed NO atypia (Dr. Merrilyn).  Mammo  neg 2008.   GERD (gastroesophageal reflux disease)    History of double vision    Ophthalmologist, Dr. Cleatus, is further evaluating this with MRI  orbits and limited brain (myesthenia gravis testing neg 07/2010)   History of home oxygen  therapy    uses 2 liters at hs   Hypertension    EKG 03/2010 normal   Hypothyroidism    Hashimoto's, dx'd 1989   Idiopathic peripheral neuropathy 04/2018   Sensory (numb feet) S1 distribution bilat, c/w spinal stenosis sensory neuropathy (no pain). 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  Labs [place], MRI C spine->some spondylosis/stenosis.  UE NCS/EMS: normal 04/2019.   Lesion of right native kidney 2017   found on CT 02/2015.  F/u MRI 03/31/15 showed it to be smaller and less likely of any significance but repeat CT renal protocol in 78mo was recommended by radiology.  This was done 10/26/15 and showed no interval change in the lesion, but b/c the lesion enhances with contrast, radiol rec'd urol consult (b/c pap renal RCC could not be excluded).  Saw urol 03/06/16--stable on u/s 09/2016 and 09/2017.   OAB (overactive bladder)    Osteoarthritis, hip, bilateral    she is s/p bilat THA as of 02/2018.   Osteoporosis    radius, T score -2.5->rec'd alendronate  08/2019->plan rpt DEXA 08/2021.   PAF (paroxysmal atrial fibrillation) (HCC)    Pneumonia    Postmenopausal atrophic vaginitis    improved with estrace 2 X/week.   Prolapse of vaginal cuff after hysterectomy    10/2018: Dr. Louvenia. Mild colpocele 04/2019 per GYN (no surgery)   Pulmonary nodule    CT chest 12/10, June 2011.  No nodule on CT 06/2010.   Recurrent Clostridium difficile diarrhea    Episode 09/19/2016: resolved with prolonged course of flagyl .  11/2016 recurrence treated with 10d of Dificid (Fidaxomicin) by GI (Dr. Luis).   Recurrent UTI    likely related to pt's atrophic vaginitis.  Urol started vaginal estrace 03/2016.   Rib fracture 09/2018   R 7th, laterally-->sustained when her dog pulled her over and she fell.   RLS (restless legs syndrome)    neuro->gabapentin  trial 2020/21   Shingles 02/2014   R abd/flank   Spinal stenosis of lumbar region with neurogenic claudication    2008 MRI (L3-4, L4-5, L5-S1).  +S1 distribution sensory loss bilat    Past Surgical History:  Procedure Laterality Date   ABDOMINAL HYSTERECTOMY  1978   no BSO per pt--nonmalignant reasons   APPENDECTOMY     BREAST CYST EXCISION     Last screening mammogram 10/2010 was normal.   CHOLECYSTECTOMY  2001   COLONOSCOPY  04/2005;  12/06/2015   2007 NORMAL.  2017 adenomatous polyp x 1.  Mod sigmoid diverticulosis (Dr. Luis).     DEXA  05/18/2017; 08/7019   2019 NORMAL (T-score 0.1).  08/2019->T score -2.5->alendronate  started.  03/2022 T score -2.7   HIP CLOSED REDUCTION Right 12/25/2015   Procedure: CLOSED REDUCTION HIP;  Surgeon: Selinda Belvie Gosling, MD;  Location: [facility];  Service: Orthopedics;  Laterality: Right;   NCV/EMS  10/2018   chronic and symmetric sensorimotor axonal polyneuropathy affecting the lower extremities   TONSILLECTOMY     TOTAL HIP ARTHROPLASTY Right 11/01/2014   Procedure: RIGHT TOTAL HIP ARTHROPLASTY;  Surgeon: Tanda Heading, MD;  Location: [facility];  Service: Orthopedics;  Laterality: Right;   TOTAL HIP ARTHROPLASTY Left 12/08/2017   Procedure: LEFT TOTAL HIP ARTHROPLASTY ANTERIOR APPROACH;  Surgeon: Ernie Cough, MD;  Location: [facility];  Service: Orthopedics;  Laterality: Left;  70 mins   TOTAL HIP REVISION Right 02/10/2023   Procedure: RIGHT TOTAL HIP REVISION;  Surgeon: Ernie Cough, MD;  Location: [facility];  Service: Orthopedics;  Laterality: Right;   TRANSTHORACIC ECHOCARDIOGRAM  08/2014   2016 EF 60-65%, grade I diast dysfxn. 06/21/21 unchanged.  05/2023 EF 60-65%, grd I DD, sev elev pulm art pressure, severe tricusp regurg   TUBAL LIGATION  1974    Outpatient Medications Prior to Visit  Medication Sig Dispense Refill   acetaminophen  (TYLENOL ) 325 MG tablet Take 2 tablets (650 mg total) by mouth every 6 (six) hours.     albuterol  (PROVENTIL ) (2.5 MG/3ML) 0.083% nebulizer solution Take 2.5 mg by nebulization every 6 (six) hours as needed for wheezing or shortness of breath.     albuterol  (VENTOLIN  HFA) 108 (90 Base) MCG/ACT inhaler INHALE 2 PUFFS BY MOUTH EVERY 4 HOURS AS NEEDED FOR WHEEZING OR SHORTNESS OF BREATH 17 g 3   apixaban  (ELIQUIS ) 2.5 MG TABS tablet Take 1 tablet (2.5 mg total) by mouth 2 (two) times daily. 180 tablet 3   diltiazem  (CARDIZEM  CD) 240 MG 24 hr capsule Take 1 capsule  (240 mg total) by mouth daily. 30 capsule 0   fluticasone  (FLONASE ) 50 MCG/ACT nasal spray Place 2 sprays into both nostrils daily. 16 g 6   Fluticasone -Umeclidin-Vilant (TRELEGY ELLIPTA ) 100-62.5-25 MCG/ACT AEPB Inhale 1 puff into the lungs daily. 180 each 3   furosemide  (LASIX ) 20 MG tablet 1-3 tabs po every day as needed for legs swelling     guaiFENesin  (MUCINEX ) 600 MG 12 hr tablet Take 2 tablets (1,200 mg total) by mouth 2 (two) times daily. 30 tablet 0   irbesartan  (AVAPRO ) 75 MG tablet TAKE 1 TABLET BY MOUTH EVERY DAY 30 tablet 0   levothyroxine  (SYNTHROID ) 100 MCG tablet Take 1 tablet (100 mcg total) by mouth daily. 30 tablet 1   metoprolol  succinate (TOPROL -XL) 25 MG 24 hr tablet Take 1 tablet (25 mg total) by mouth daily. 30 tablet 0   Misc. Devices (PULSE OXIMETER) MISC Check oxygen  level as needed 1 each 1   pantoprazole  (PROTONIX ) 40 MG tablet TAKE 1 TABLET(40 MG) BY MOUTH TWICE DAILY 180 tablet 1   potassium chloride  SA (KLOR-CON  M) 20 MEQ tablet Take 1 tablet (20 mEq total) by mouth daily. 30 tablet 0   venlafaxine  (EFFEXOR ) 50 MG tablet TAKE 2 TABLETS BY MOUTH EVERY MORNING AND 1 TABLET EVERY NIGHT AT BEDTIME 90 tablet 1   No facility-administered medications prior to visit.    Allergies[1]  Review of Systems As per HPI  PE:    02/08/2024    2:40 PM 01/08/2024    4:05 PM 01/08/2024    4:00 PM  Vitals with BMI  Height 5' 2  5' 2  Weight 106 lbs 6 oz  108 lbs 10 oz  BMI 19.46  19.86  Systolic 133 163 843  Diastolic 83 79 86  Pulse 68 64 93   98% on 3L oxygen   Physical Exam  Gen: Alert, well appearing.  Patient is oriented to person, place, time, and situation. AFFECT: pleasant, lucid thought and speech. ZWU:Zbzd: no injection, icteris, swelling, or exudate.  EOMI, PERRLA. Mouth: lips without lesion/swelling.  Oral mucosa pink and moist. Oropharynx without erythema, exudate, or swelling.  CV: RRR, no m/r/g.   LUNGS: CTA bilat, nonlabored resps, good aeration in  all lung fields. Extremities: No edema or  cyanosis.  LABS:  Last CBC Lab Results  Component Value Date   WBC 6.6 12/24/2023   HGB 13.1 12/24/2023   HCT 40.5 12/24/2023   MCV 87.5 12/24/2023   MCH 28.3 12/24/2023   RDW 14.9 12/24/2023   PLT 236 12/24/2023   Last metabolic panel Lab Results  Component Value Date   GLUCOSE 89 12/24/2023   NA 142 12/24/2023   K 3.7 12/24/2023   CL 105 12/24/2023   CO2 26 12/24/2023   BUN 16 12/24/2023   CREATININE 0.73 12/24/2023   GFRNONAA >60 12/24/2023   CALCIUM  9.5 12/24/2023   PHOS 3.0 06/16/2023   PROT 6.6 12/24/2023   ALBUMIN 4.4 12/24/2023   BILITOT 0.6 12/24/2023   ALKPHOS 97 12/24/2023   AST 18 12/24/2023   ALT 13 12/24/2023   ANIONGAP 12 12/24/2023   Last thyroid  functions Lab Results  Component Value Date   TSH 1.43 12/04/2023   T3TOTAL 90 12/25/2022   T4TOTAL 12.4 07/11/2010   FREET4 2.15 (H) 06/01/2023   IMPRESSION AND PLAN:  #1 URI with acute exacerbation of COPD. Prednisone  taper prescribed--> 40 mg a day x 2 days, 30 mg a day x 2 days, 20 mg a day x 2 days, then 10 mg a day x 2 days. Continue albuterol  every 6 hours as needed and Trelegy Ellipta  daily.  Continue oxygen  3 L nasal cannula 24/7.  #2 labile hypertension. Stable on irbesartan  75 mg a day and Toprol -XL 25 mg a day.  3.  Chronic left shoulder pain due to severe osteoarthritis. Not much improvement with GH steroid injection 1 month ago. She will continue Tylenol  and Biofreeze. Consider repeat injection in 2 months.  An After Visit Summary was printed and given to the patient.  FOLLOW UP: Return in about 2 weeks (around 02/22/2024) for f/u copd.  Signed:  Gerlene Hockey, MD           02/08/2024     [1]  Allergies Allergen Reactions   Losartan  Other (See Comments)    hyperkalemia   Penicillins Hives, Swelling and Rash    Has patient had a PCN reaction causing immediate rash, facial/tongue/throat swelling, SOB or lightheadedness with hypotension:  YES Has patient had a PCN reaction causing severe rash involving mucus membranes or skin necrosis: NO Has patient had a PCN reaction that required hospitalization NO Has patient had a PCN reaction occurring within the last 10 years: NO If all of the above answers are NO, then may proceed with Cephalosporin use.    Cefixime [Kdc:Cefixime] Other (See Comments)    unspecified   Gabapentin  Other (See Comments)    Feels woozy   Indocin [Indomethacin] Other (See Comments)    Painful tongue and throat   Singulair  [Montelukast  Sodium]     Chest tightness, decreased mental clarity   Sulfa Antibiotics Other (See Comments)    unspecified   Ace Inhibitors Other (See Comments)    cough   Statins Other (See Comments)    Myalgias

## 2024-02-11 ENCOUNTER — Other Ambulatory Visit: Payer: Self-pay | Admitting: Primary Care

## 2024-02-17 ENCOUNTER — Ambulatory Visit (HOSPITAL_BASED_OUTPATIENT_CLINIC_OR_DEPARTMENT_OTHER)

## 2024-02-24 ENCOUNTER — Ambulatory Visit (HOSPITAL_BASED_OUTPATIENT_CLINIC_OR_DEPARTMENT_OTHER)

## 2024-02-24 ENCOUNTER — Encounter (HOSPITAL_BASED_OUTPATIENT_CLINIC_OR_DEPARTMENT_OTHER): Payer: Self-pay

## 2024-02-24 ENCOUNTER — Other Ambulatory Visit: Payer: Self-pay | Admitting: Family Medicine

## 2024-02-24 ENCOUNTER — Ambulatory Visit: Admitting: Family Medicine

## 2024-02-24 ENCOUNTER — Encounter: Payer: Self-pay | Admitting: Family Medicine

## 2024-02-24 VITALS — BP 161/82 | HR 63 | Ht 62.0 in | Wt 110.0 lb

## 2024-02-24 VITALS — BP 170/80 | HR 61 | Temp 97.8°F | Ht 62.0 in | Wt 110.4 lb

## 2024-02-24 DIAGNOSIS — J9611 Chronic respiratory failure with hypoxia: Secondary | ICD-10-CM | POA: Diagnosis not present

## 2024-02-24 DIAGNOSIS — J441 Chronic obstructive pulmonary disease with (acute) exacerbation: Secondary | ICD-10-CM

## 2024-02-24 DIAGNOSIS — B370 Candidal stomatitis: Secondary | ICD-10-CM

## 2024-02-24 DIAGNOSIS — Z87891 Personal history of nicotine dependence: Secondary | ICD-10-CM | POA: Diagnosis not present

## 2024-02-24 DIAGNOSIS — I10 Essential (primary) hypertension: Secondary | ICD-10-CM

## 2024-02-24 DIAGNOSIS — J4489 Other specified chronic obstructive pulmonary disease: Secondary | ICD-10-CM | POA: Diagnosis not present

## 2024-02-24 DIAGNOSIS — J439 Emphysema, unspecified: Secondary | ICD-10-CM | POA: Diagnosis not present

## 2024-02-24 MED ORDER — IRBESARTAN 150 MG PO TABS: 150.0000 mg | ORAL_TABLET | Freq: Every day | ORAL | 0 refills | Status: DC

## 2024-02-24 MED ORDER — NYSTATIN 100000 UNIT/ML MT SUSP: 5.0000 mL | Freq: Four times a day (QID) | OROMUCOSAL | 0 refills | Status: AC

## 2024-02-24 MED ORDER — TRELEGY ELLIPTA 100-62.5-25 MCG/ACT IN AEPB: 1.0000 | INHALATION_SPRAY | Freq: Every day | RESPIRATORY_TRACT | 0 refills | Status: AC

## 2024-02-24 NOTE — Progress Notes (Signed)
 OFFICE VISIT  02/24/2024  CC:  Chief Complaint  Patient presents with   Medical Management of Chronic Issues    Patient is a 89 y.o. female who presents accompanied by boyfriend Carlin for 2-week follow-up COPD exacerbation. A/P as of last visit: #1 URI with acute exacerbation of COPD. Prednisone  taper prescribed--> 40 mg a day x 2 days, 30 mg a day x 2 days, 20 mg a day x 2 days, then 10 mg a day x 2 days. Continue albuterol  every 6 hours as needed and Trelegy Ellipta  daily.  Continue oxygen  3 L nasal cannula 24/7.   #2 labile hypertension. Stable on irbesartan  75 mg a day and Toprol -XL 25 mg a day.   3.  Chronic left shoulder pain due to severe osteoarthritis. Not much improvement with GH steroid injection 1 month ago. She will continue Tylenol  and Biofreeze. Consider repeat injection in 2 months.  INTERIM HX: Doing much better, is back to baseline resp status, 2L oxygen  24/7.  Finished prednisone . BP at pulm MD today 179/72. No home bp monitoring at all lately.  No headaches or visual abnormalities, no chest pain, no lower extremity swelling.  She does feel discomfort in her mouth and says there is soreness under her tongue and she sometimes sees plaque on her tongue and inside her cheeks. She has been using hydrogen peroxide swish and spit.  Past Medical History:  Diagnosis Date   Allergic rhinitis    with upper airway cough: as of 07/2017 pulm f/u she was instructed to use astelin , flonase , and saline more consistently   Arthritis of left shoulder    01/2024   BPPV (benign paroxysmal positional vertigo) 03/2018   C. difficile colitis    Chest pain, non-cardiac    Cardiac CT showed no signif obstructive dz, mildly elevated calcium  level (Dr. Pietro).   Chronic cystitis with hematuria    Dr. Roman   Chronic diastolic heart failure (HCC)    Chronic hypoxemic respiratory failure (HCC) 02/02/2015   2L oxygen  24/7   Chronic renal insufficiency, stage 2  (mild)    GFR approx 60   Cold hands    NCS/EMGs normal.  Hand dysesthesias per neuro--reassured   COPD (chronic obstructive pulmonary disease) (HCC)    GOLD 4.  spirometry 01/10/09 FEV 0.97(52%), FEV1% 47.  With chronic bronchitis as of 07/2017.   DDD (degenerative disc disease), cervical    1998 MRI C5-6 impingemt (left).  MRI 2021 (neurol) 1 level canal stenosis, 1 level foraminal stenosis--->PT   DDD (degenerative disc disease), lumbar    Depression with anxiety 04/15/2011   Diarrheal disease Summer 2017   ? 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 Dr Luis).  As of 10/05/15 pt reports GI dx'd her with C diff and rx'd more flagyl  and she is also on cholestyramine .  As of 02/2016, pt's sx's resolved completely.   Diverticulosis of colon    Procto '97 and colonoscopy 2002   Dysplastic nevus of upper extremity 04/2014   R tricep (Dr. Livingston)   Edema of both lower extremities due to peripheral venous insufficiency    Varicosities bilat, swelling L>R.  R baker's cyst.  Got vein clinic eval 02/2018->sclerotherapy recommended but since no hemorrhage or ulceration, insurance will not cover the procedure.   Family history of adverse reaction to anesthesia    mother died when patient age 89 receiving anesthesia for thyroid  surgery   Fibrocystic breast disease  w/fibroadenoma.  Bx's showed NO atypia (Dr. Merrilyn).  Mammo neg 2008.   GERD (gastroesophageal reflux disease)    History of double vision    Ophthalmologist, Dr. Cleatus, is further evaluating this with MRI  orbits and limited brain (myesthenia gravis testing neg 07/2010)   History of home oxygen  therapy    uses 2 liters at hs   Hypertension    EKG 03/2010 normal   Hypothyroidism    Hashimoto's, dx'd 1989   Idiopathic peripheral neuropathy 04/2018   Sensory (numb feet) S1 distribution bilat, c/w spinal stenosis sensory neuropathy (no pain). 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  Labs [place], MRI C spine->some spondylosis/stenosis.  UE NCS/EMS: normal 04/2019.   Lesion of right native kidney 2017   found on CT 02/2015.  F/u MRI 03/31/15 showed it to be smaller and less likely of any significance but repeat CT renal protocol in 14mo was recommended by radiology.  This was done 10/26/15 and showed no interval change in the lesion, but b/c the lesion enhances with contrast, radiol rec'd urol consult (b/c pap renal RCC could not be excluded).  Saw urol 03/06/16--stable on u/s 09/2016 and 09/2017.   OAB (overactive bladder)    Osteoarthritis, hip, bilateral    she is s/p bilat THA as of 02/2018.   Osteoporosis    radius, T score -2.5->rec'd alendronate  08/2019->plan rpt DEXA 08/2021.   PAF (paroxysmal atrial fibrillation) (HCC)    Pneumonia    Postmenopausal atrophic vaginitis    improved with estrace 2 X/week.   Prolapse of vaginal cuff after hysterectomy    10/2018: Dr. Louvenia. Mild colpocele 04/2019 per GYN (no surgery)   Pulmonary nodule    CT chest 12/10, June 2011.  No nodule on CT 06/2010.   Recurrent Clostridium difficile diarrhea    Episode 09/19/2016: resolved with prolonged course of flagyl .  11/2016 recurrence treated with 10d of Dificid (Fidaxomicin) by GI (Dr. Luis).   Recurrent UTI    likely related to pt's atrophic vaginitis.  Urol started vaginal estrace 03/2016.   Rib fracture 09/2018   R 7th, laterally-->sustained when her dog pulled her over and she fell.   RLS (restless legs syndrome)    neuro->gabapentin  trial 2020/21   Shingles 02/2014   R abd/flank   Spinal stenosis of lumbar region with neurogenic claudication    2008 MRI (L3-4, L4-5, L5-S1).  +S1 distribution sensory loss bilat    Past Surgical History:  Procedure Laterality Date   ABDOMINAL HYSTERECTOMY  1978   no BSO per pt--nonmalignant reasons   APPENDECTOMY     BREAST CYST EXCISION     Last  screening mammogram 10/2010 was normal.   CHOLECYSTECTOMY  2001   COLONOSCOPY  04/2005; 12/06/2015   2007 NORMAL.  2017 adenomatous polyp x 1.  Mod sigmoid diverticulosis (Dr. Luis).     DEXA  05/18/2017; 08/7019   2019 NORMAL (T-score 0.1).  08/2019->T score -2.5->alendronate  started.  03/2022 T score -2.7   HIP CLOSED REDUCTION Right 12/25/2015   Procedure: CLOSED REDUCTION HIP;  Surgeon: Selinda Belvie Gosling, MD;  Location: [facility];  Service: Orthopedics;  Laterality: Right;   NCV/EMS  10/2018   chronic and symmetric sensorimotor axonal polyneuropathy affecting the lower extremities   TONSILLECTOMY     TOTAL HIP ARTHROPLASTY Right 11/01/2014   Procedure: RIGHT TOTAL HIP ARTHROPLASTY;  Surgeon: Tanda Heading, MD;  Location: [facility];  Service: Orthopedics;  Laterality: Right;   TOTAL HIP ARTHROPLASTY Left  12/08/2017   Procedure: LEFT TOTAL HIP ARTHROPLASTY ANTERIOR APPROACH;  Surgeon: Ernie Cough, MD;  Location: [facility];  Service: Orthopedics;  Laterality: Left;  70 mins   TOTAL HIP REVISION Right 02/10/2023   Procedure: RIGHT TOTAL HIP REVISION;  Surgeon: Ernie Cough, MD;  Location: [facility];  Service: Orthopedics;  Laterality: Right;   TRANSTHORACIC ECHOCARDIOGRAM  08/2014   2016 EF 60-65%, grade I diast dysfxn. 06/21/21 unchanged.  05/2023 EF 60-65%, grd I DD, sev elev pulm art pressure, severe tricusp regurg   TUBAL LIGATION  1974    Outpatient Medications Prior to Visit  Medication Sig Dispense Refill   acetaminophen  (TYLENOL ) 325 MG tablet Take 2 tablets (650 mg total) by mouth every 6 (six) hours.     albuterol  (PROVENTIL ) (2.5 MG/3ML) 0.083% nebulizer solution Take 2.5 mg by nebulization every 6 (six) hours as needed for wheezing or shortness of breath.     albuterol  (VENTOLIN  HFA) 108 (90 Base) MCG/ACT inhaler INHALE 2 PUFFS BY MOUTH EVERY 4 HOURS AS NEEDED FOR WHEEZING OR SHORTNESS OF BREATH 17 g 3   apixaban  (ELIQUIS ) 2.5 MG TABS tablet Take 1 tablet (2.5 mg total) by mouth 2 (two)  times daily. 180 tablet 3   diltiazem  (CARDIZEM  CD) 240 MG 24 hr capsule Take 1 capsule (240 mg total) by mouth daily. 30 capsule 0   fluticasone  (FLONASE ) 50 MCG/ACT nasal spray Place 2 sprays into both nostrils daily. 16 g 6   Fluticasone -Umeclidin-Vilant (TRELEGY ELLIPTA ) 100-62.5-25 MCG/ACT AEPB Inhale 1 puff into the lungs daily. 180 each 0   furosemide  (LASIX ) 20 MG tablet 1-3 tabs po every day as needed for legs swelling     guaiFENesin  (MUCINEX ) 600 MG 12 hr tablet Take 2 tablets (1,200 mg total) by mouth 2 (two) times daily. 30 tablet 0   levothyroxine  (SYNTHROID ) 100 MCG tablet Take 1 tablet (100 mcg total) by mouth daily. 30 tablet 1   metoprolol  succinate (TOPROL -XL) 25 MG 24 hr tablet Take 1 tablet (25 mg total) by mouth daily. 30 tablet 0   Misc. Devices (PULSE OXIMETER) MISC Check oxygen  level as needed 1 each 1   pantoprazole  (PROTONIX ) 40 MG tablet TAKE 1 TABLET(40 MG) BY MOUTH TWICE DAILY 180 tablet 1   potassium chloride  SA (KLOR-CON  M) 20 MEQ tablet Take 1 tablet (20 mEq total) by mouth daily. 30 tablet 0   predniSONE  (DELTASONE ) 10 MG tablet 4 every day x 2d, 3 every day x 2d, 2 every day x 2d, 1 every day x 2d 20 tablet 0   venlafaxine  (EFFEXOR ) 50 MG tablet TAKE 2 TABLETS BY MOUTH EVERY MORNING AND 1 TABLET EVERY NIGHT AT BEDTIME 90 tablet 1   irbesartan  (AVAPRO ) 75 MG tablet TAKE 1 TABLET BY MOUTH EVERY DAY 30 tablet 0   No facility-administered medications prior to visit.    Allergies[1]  Review of Systems As per HPI  PE:    02/24/2024    3:02 PM 02/24/2024    2:51 PM 02/24/2024    2:45 PM  Vitals with BMI  Height   5' 2  Weight   110 lbs 6 oz  BMI   20.19  Systolic 170 197 795  Diastolic 80 95 102  Pulse   61  95% 2L oxygen   Physical Exam  Gen: alert, well-appearing.  Oriented x 4, focus/attention is good. Oral: no lesion/plaque CV: RRR, no m/r/g.   LUNGS: CTA bilat, nonlabored resps, good aeration in all lung fields. EXT: no edema  LABS:  Last  metabolic panel Lab Results  Component Value Date   GLUCOSE 89 12/24/2023   NA 142 12/24/2023   K 3.7 12/24/2023   CL 105 12/24/2023   CO2 26 12/24/2023   BUN 16 12/24/2023   CREATININE 0.73 12/24/2023   GFRNONAA >60 12/24/2023   CALCIUM  9.5 12/24/2023   PHOS 3.0 06/16/2023   PROT 6.6 12/24/2023   ALBUMIN 4.4 12/24/2023   BILITOT 0.6 12/24/2023   ALKPHOS 97 12/24/2023   AST 18 12/24/2023   ALT 13 12/24/2023   ANIONGAP 12 12/24/2023   Lab Results  Component Value Date   HGBA1C 5.5 07/22/2023   HGBA1C 5.5 07/22/2023   HGBA1C 5.5 (A) 07/22/2023   HGBA1C 5.5 07/22/2023   Lab Results  Component Value Date   WBC 6.6 12/24/2023   HGB 13.1 12/24/2023   HCT 40.5 12/24/2023   MCV 87.5 12/24/2023   PLT 236 12/24/2023   Lab Results  Component Value Date   TSH 1.43 12/04/2023   IMPRESSION AND PLAN:  #1 COPD exacerbation, resolved. Continue Trelegy Ellipta  1 puff daily and albuterol  every 6 hours as needed.  Continue 2 L O2 24/7.  2.  Uncontrolled hypertension. Increase irbesartan  to 150 mg daily. She will try to get a blood pressure cuff and monitor at home periodically.  Will see her back in 2 weeks.  Check bmet at that time.  #3 thrush. Nystatin  suspension, 1 teaspoon 4 times daily x 15 days prescribed today.  An After Visit Summary was printed and given to the patient.  FOLLOW UP: Return in about 2 weeks (around 03/09/2024) for f/u HTN.  Signed:  Gerlene Hockey, MD           02/24/2024     [1]  Allergies Allergen Reactions   Losartan  Other (See Comments)    hyperkalemia   Penicillins Hives, Swelling and Rash    Has patient had a PCN reaction causing immediate rash, facial/tongue/throat swelling, SOB or lightheadedness with hypotension: YES Has patient had a PCN reaction causing severe rash involving mucus membranes or skin necrosis: NO Has patient had a PCN reaction that required hospitalization NO Has patient had a PCN reaction occurring within the last 10  years: NO If all of the above answers are NO, then may proceed with Cephalosporin use.    Cefixime [Kdc:Cefixime] Other (See Comments)    unspecified   Gabapentin  Other (See Comments)    Feels woozy   Indocin [Indomethacin] Other (See Comments)    Painful tongue and throat   Singulair  [Montelukast  Sodium]     Chest tightness, decreased mental clarity   Sulfa Antibiotics Other (See Comments)    unspecified   Ace Inhibitors Other (See Comments)    cough   Statins Other (See Comments)    Myalgias

## 2024-02-24 NOTE — Patient Instructions (Signed)
 Continue Trelegy once daily.  Continue Albuterol  every 4-6 hours as needed.  Follow up in 6 months; sooner if new or worsening symptoms.

## 2024-02-24 NOTE — Progress Notes (Unsigned)
 "  @Patient  ID: Priscilla Cantrell, female    DOB: June 01, 1935, 89 y.o.   MRN: [id number]  No chief complaint on file.   Referring provider: Candise Aleene DEL, MD  HPI: Discussed the use of AI scribe software for clinical note transcription with the patient, who gave verbal consent to proceed.  History of Present Illness     Last OV 11/19/2023: 89 yo ex-smoker for FU of  GOLD -4 COPD and nocturnal Oxygen .  PMH : Chronic cystitis, DDD, Anxiety, Depression, C diff 2017 and 2018, Diverticulosis, GERD, HTN, Hypothyroidism, PNA, Shingles, Spinal stenosis  05/2023 hosp for para influenza infection,copd exac  Priscilla Cantrell is an 89 year old female with COPD who presents for a follow-up visit to establish care. She was previously under the care of Dr. Shellia.   She is on nocturnal oxygen  therapy and has been using oxygen  for two years. She experiences ongoing dyspnea with variability in ambulating short distances. She recalls a previous episode of severe bronchitis or COPD exacerbation managed with prednisone . Her current medications include Trelegy and Lasix , though she is uncertain about the necessity of Lasix . She also uses a nebulizer with albuterol  at home.   She has a cough with clear phlegm and no recent changes in the color of the phlegm. Her blood pressure was elevated today, and she is on two antihypertensive medications. She visited the ER two weeks ago due to prolonged wait times and did not receive treatment for her blood pressure. She has a follow-up appointment with her primary care physician next week.       Significant tests/ events reviewed   Spirometry 01/10/09>>FEV1 0.97(52%), FEV1% 47 RAST 05/04/17 >> negative, IgE 8 Spirometry 05/14/17 >> FEV1 0.91 (55%), FEV1% 48 PFT 10/29/18 >> FEV1 1.05 (68%), FEV1% 52, TLC 4.29 (96%), DLCO 44% PSG 11/14/11 >> AHI 0.3  ONO with RA 07/31/16 >> test time 6 hrs 41 min.  Average SpO2 87%, low SpO2 74%.  Spent 4 hrs 13 min with SpO2 < 88%. ONO  with 2 liters 07/24/21 >> test time 3 hrs 58 min.  Baseline SpO2 97%, low SpO2 84%.  Spent 19 sec with SpO2 < 88%.    Allergies[1]  Immunization History  Administered Date(s) Administered   Fluad Quad(high Dose 65+) 10/05/2018, 10/25/2019, 12/07/2020   Fluad Trivalent(High Dose 65+) 10/23/2022   INFLUENZA, HIGH DOSE SEASONAL PF 11/07/2016, 11/04/2017   Influenza Split 11/04/2010, 10/23/2011   Influenza Whole 10/04/2009   Influenza,inj,Quad PF,6+ Mos 10/07/2012, 10/11/2014   Influenza-Unspecified 11/07/2013, 11/01/2015   Moderna Sars-Covid-2 Vaccination 02/14/2019, 03/15/2019, 12/15/2019   PNEUMOCOCCAL CONJUGATE-20 07/17/2020   Pfizer Covid-19 Vaccine Bivalent Booster 83yrs & up 12/19/2020   Pneumococcal Conjugate-13 04/29/2013   Pneumococcal Polysaccharide-23 02/03/2005, 11/28/2010, 11/01/2018   Td 02/04/2008, 03/07/2010   Tdap 03/07/2020   Zoster Recombinant(Shingrix) 10/30/2016, 12/29/2016   Zoster, Live 02/10/2013    Past Medical History:  Diagnosis Date   Allergic rhinitis    with upper airway cough: as of 07/2017 pulm f/u she was instructed to use astelin , flonase , and saline more consistently   Arthritis of left shoulder    01/2024   BPPV (benign paroxysmal positional vertigo) 03/2018   C. difficile colitis    Chest pain, non-cardiac    Cardiac CT showed no signif obstructive dz, mildly elevated calcium  level (Dr. Pietro).   Chronic cystitis with hematuria    Dr. Roman   Chronic diastolic heart failure (HCC)    Chronic hypoxemic respiratory failure (HCC) 02/02/2015  2L oxygen  24/7   Chronic renal insufficiency, stage 2 (mild)    GFR approx 60   Cold hands    NCS/EMGs normal.  Hand dysesthesias per neuro--reassured   COPD (chronic obstructive pulmonary disease) (HCC)    GOLD 4.  spirometry 01/10/09 FEV 0.97(52%), FEV1% 47.  With chronic bronchitis as of 07/2017.   DDD (degenerative disc disease), cervical    1998 MRI C5-6 impingemt (left).  MRI 2021  (neurol) 1 level canal stenosis, 1 level foraminal stenosis--->PT   DDD (degenerative disc disease), lumbar    Depression with anxiety 04/15/2011   Diarrheal disease Summer 2017   ? 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 Dr Luis).  As of 10/05/15 pt reports GI dx'd her with C diff and rx'd more flagyl  and she is also on cholestyramine .  As of 02/2016, pt's sx's resolved completely.   Diverticulosis of colon    Procto '97 and colonoscopy 2002   Dysplastic nevus of upper extremity 04/2014   R tricep (Dr. Livingston)   Edema of both lower extremities due to peripheral venous insufficiency    Varicosities bilat, swelling L>R.  R baker's cyst.  Got vein clinic eval 02/2018->sclerotherapy recommended but since no hemorrhage or ulceration, insurance will not cover the procedure.   Family history of adverse reaction to anesthesia    mother died when patient age 23 receiving anesthesia for thyroid  surgery   Fibrocystic breast disease    w/fibroadenoma.  Bx's showed NO atypia (Dr. Merrilyn).  Mammo neg 2008.   GERD (gastroesophageal reflux disease)    History of double vision    Ophthalmologist, Dr. Cleatus, is further evaluating this with MRI  orbits and limited brain (myesthenia gravis testing neg 07/2010)   History of home oxygen  therapy    uses 2 liters at hs   Hypertension    EKG 03/2010 normal   Hypothyroidism    Hashimoto's, dx'd 1989   Idiopathic peripheral neuropathy 04/2018   Sensory (numb feet) S1 distribution bilat, c/w spinal stenosis sensory neuropathy (no pain). 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  Labs [place], MRI C spine->some spondylosis/stenosis.  UE NCS/EMS: normal 04/2019.   Lesion of right native kidney 2017   found on CT 02/2015.  F/u MRI 03/31/15 showed it to be smaller and less likely of any significance but repeat CT renal  protocol in 42mo was recommended by radiology.  This was done 10/26/15 and showed no interval change in the lesion, but b/c the lesion enhances with contrast, radiol rec'd urol consult (b/c pap renal RCC could not be excluded).  Saw urol 03/06/16--stable on u/s 09/2016 and 09/2017.   OAB (overactive bladder)    Osteoarthritis, hip, bilateral    she is s/p bilat THA as of 02/2018.   Osteoporosis    radius, T score -2.5->rec'd alendronate  08/2019->plan rpt DEXA 08/2021.   PAF (paroxysmal atrial fibrillation) (HCC)    Pneumonia    Postmenopausal atrophic vaginitis    improved with estrace 2 X/week.   Prolapse of vaginal cuff after hysterectomy    10/2018: Dr. Louvenia. Mild colpocele 04/2019 per GYN (no surgery)   Pulmonary nodule    CT chest 12/10, June 2011.  No nodule on CT 06/2010.   Recurrent Clostridium difficile diarrhea    Episode 09/19/2016: resolved with prolonged course of flagyl .  11/2016 recurrence treated with 10d of Dificid (Fidaxomicin) by GI (Dr. Luis).   Recurrent UTI  likely related to pt's atrophic vaginitis.  Urol started vaginal estrace 03/2016.   Rib fracture 09/2018   R 7th, laterally-->sustained when her dog pulled her over and she fell.   RLS (restless legs syndrome)    neuro->gabapentin  trial 2020/21   Shingles 02/2014   R abd/flank   Spinal stenosis of lumbar region with neurogenic claudication    2008 MRI (L3-4, L4-5, L5-S1).  +S1 distribution sensory loss bilat    Tobacco History: Tobacco Use History[2] Counseling given: Not Answered   Outpatient Medications Prior to Visit  Medication Sig Dispense Refill   acetaminophen  (TYLENOL ) 325 MG tablet Take 2 tablets (650 mg total) by mouth every 6 (six) hours.     albuterol  (PROVENTIL ) (2.5 MG/3ML) 0.083% nebulizer solution Take 2.5 mg by nebulization every 6 (six) hours as needed for wheezing or shortness of breath.     albuterol  (VENTOLIN  HFA) 108 (90 Base) MCG/ACT inhaler INHALE 2 PUFFS BY MOUTH EVERY 4 HOURS AS NEEDED  FOR WHEEZING OR SHORTNESS OF BREATH 17 g 3   apixaban  (ELIQUIS ) 2.5 MG TABS tablet Take 1 tablet (2.5 mg total) by mouth 2 (two) times daily. 180 tablet 3   diltiazem  (CARDIZEM  CD) 240 MG 24 hr capsule Take 1 capsule (240 mg total) by mouth daily. 30 capsule 0   fluticasone  (FLONASE ) 50 MCG/ACT nasal spray Place 2 sprays into both nostrils daily. 16 g 6   Fluticasone -Umeclidin-Vilant (TRELEGY ELLIPTA ) 100-62.5-25 MCG/ACT AEPB INHALE 1 PUFF INTO THE LUNGS DAILY 180 each 0   furosemide  (LASIX ) 20 MG tablet 1-3 tabs po every day as needed for legs swelling     guaiFENesin  (MUCINEX ) 600 MG 12 hr tablet Take 2 tablets (1,200 mg total) by mouth 2 (two) times daily. 30 tablet 0   irbesartan  (AVAPRO ) 75 MG tablet TAKE 1 TABLET BY MOUTH EVERY DAY 30 tablet 0   levothyroxine  (SYNTHROID ) 100 MCG tablet Take 1 tablet (100 mcg total) by mouth daily. 30 tablet 1   metoprolol  succinate (TOPROL -XL) 25 MG 24 hr tablet Take 1 tablet (25 mg total) by mouth daily. 30 tablet 0   Misc. Devices (PULSE OXIMETER) MISC Check oxygen  level as needed 1 each 1   pantoprazole  (PROTONIX ) 40 MG tablet TAKE 1 TABLET(40 MG) BY MOUTH TWICE DAILY 180 tablet 1   potassium chloride  SA (KLOR-CON  M) 20 MEQ tablet Take 1 tablet (20 mEq total) by mouth daily. 30 tablet 0   predniSONE  (DELTASONE ) 10 MG tablet 4 every day x 2d, 3 every day x 2d, 2 every day x 2d, 1 every day x 2d 20 tablet 0   venlafaxine  (EFFEXOR ) 50 MG tablet TAKE 2 TABLETS BY MOUTH EVERY MORNING AND 1 TABLET EVERY NIGHT AT BEDTIME 90 tablet 1   No facility-administered medications prior to visit.     Review of Systems:   Constitutional:   No  weight loss, night sweats,  Fevers, chills, fatigue, or  lassitude.  HEENT:   No headaches,  Difficulty swallowing,  Tooth/dental problems, or  Sore throat,                No sneezing, itching, ear ache, nasal congestion, post nasal drip,   CV:  No chest pain,  Orthopnea, PND, swelling in lower extremities, anasarca,  dizziness, palpitations, syncope.   GI  No heartburn, indigestion, abdominal pain, nausea, vomiting, diarrhea, change in bowel habits, loss of appetite, bloody stools.   Resp: No shortness of breath with exertion or at rest.  No excess mucus, no  productive cough,  No non-productive cough,  No coughing up of blood.  No change in color of mucus.  No wheezing.  No chest wall deformity  Skin: no rash or lesions.  GU: no dysuria, change in color of urine, no urgency or frequency.  No flank pain, no hematuria   MS:  No joint pain or swelling.  No decreased range of motion.  No back pain.    Physical Exam  There were no vitals taken for this visit.  GEN: A/Ox3; pleasant , NAD, well nourished    HEENT:  [place]/AT,  EACs-clear, TMs-wnl, NOSE-clear, THROAT-clear, no lesions, no postnasal drip or exudate noted.   NECK:  Supple w/ fair ROM; no JVD; normal carotid impulses w/o bruits; no thyromegaly or nodules palpated; no lymphadenopathy.    RESP  Clear  P & A; w/o, wheezes/ rales/ or rhonchi. no accessory muscle use, no dullness to percussion  CARD:  RRR, no m/r/g, no peripheral edema, pulses intact, no cyanosis or clubbing.  GI:   Soft & nt; nml bowel sounds; no organomegaly or masses detected.   Musco: Warm bil, no deformities or joint swelling noted.   Neuro: alert, no focal deficits noted.    Skin: Warm, no lesions or rashes    Lab Results:  CBC    Component Value Date/Time   WBC 6.6 12/24/2023 1133   RBC 4.63 12/24/2023 1133   HGB 13.1 12/24/2023 1133   HCT 40.5 12/24/2023 1133   PLT 236 12/24/2023 1133   MCV 87.5 12/24/2023 1133   MCH 28.3 12/24/2023 1133   MCHC 32.3 12/24/2023 1133   RDW 14.9 12/24/2023 1133   LYMPHSABS 1.1 12/24/2023 1133   MONOABS 0.6 12/24/2023 1133   EOSABS 0.1 12/24/2023 1133   BASOSABS 0.0 12/24/2023 1133    BMET    Component Value Date/Time   NA 142 12/24/2023 1133   K 3.7 12/24/2023 1133   CL 105 12/24/2023 1133   CO2 26 12/24/2023 1133    GLUCOSE 89 12/24/2023 1133   BUN 16 12/24/2023 1133   CREATININE 0.73 12/24/2023 1133   CREATININE 0.95 06/28/2021 1553   CALCIUM  9.5 12/24/2023 1133   GFRNONAA >60 12/24/2023 1133   GFRAA >60 12/10/2017 0559    BNP    Component Value Date/Time   BNP 433.8 (H) 06/03/2023 0416    ProBNP No results found for: PROBNP  Imaging: No results found.  methylPREDNISolone  acetate (DEPO-MEDROL ) injection 40 mg     Date Action Dose Route User   01/08/2024 1649 Given 40 mg Intra-articular (Left Arm) Fleta Care D, CMA          Latest Ref Rng & Units 10/29/2018    1:56 PM  PFT Results  FVC-Pre L 2.01   FVC-Predicted Pre % 96   FVC-Post L 2.03   FVC-Predicted Post % 97   Pre FEV1/FVC % % 52   Post FEV1/FCV % % 52   FEV1-Pre L 1.04   FEV1-Predicted Pre % 67   FEV1-Post L 1.05   DLCO uncorrected ml/min/mmHg 7.25   DLCO UNC% % 44   DLVA Predicted % 51   TLC L 4.29   TLC % Predicted % 96   RV % Predicted % 76     No results found for: NITRICOXIDE   Assessment & Plan:   Assessment & Plan    No follow-ups on file.  Candis Dandy, PA-C 02/24/2024      [1]  Allergies Allergen Reactions   Losartan  Other (  See Comments)    hyperkalemia   Penicillins Hives, Swelling and Rash    Has patient had a PCN reaction causing immediate rash, facial/tongue/throat swelling, SOB or lightheadedness with hypotension: YES Has patient had a PCN reaction causing severe rash involving mucus membranes or skin necrosis: NO Has patient had a PCN reaction that required hospitalization NO Has patient had a PCN reaction occurring within the last 10 years: NO If all of the above answers are NO, then may proceed with Cephalosporin use.    Cefixime [Kdc:Cefixime] Other (See Comments)    unspecified   Gabapentin  Other (See Comments)    Feels woozy   Indocin [Indomethacin] Other (See Comments)    Painful tongue and throat   Singulair  [Montelukast  Sodium]     Chest tightness,  decreased mental clarity   Sulfa Antibiotics Other (See Comments)    unspecified   Ace Inhibitors Other (See Comments)    cough   Statins Other (See Comments)    Myalgias   [2]  Social History Tobacco Use  Smoking Status Former   Current packs/day: 0.00   Average packs/day: 1.5 packs/day for 34.0 years (51.0 ttl pk-yrs)   Types: Cigarettes   Start date: 63   Quit date: 02/03/1990   Years since quitting: 34.0  Smokeless Tobacco Never   "

## 2024-02-24 NOTE — Progress Notes (Signed)
 "  @Patient  ID: Priscilla Cantrell, female    DOB: 11/17/1935, 89 y.o.   MRN: [id number]  Chief Complaint  Patient presents with   COPD    Referring provider: Candise Aleene DEL, MD  HPI: Discussed the use of AI scribe software for clinical note transcription with the patient, who gave verbal consent to proceed.  History of Present Illness Priscilla Cantrell is an 89 year old female with COPD who presents for follow-up of her respiratory condition.  Her breathing has returned to normal after experiencing a respiratory infection earlier in the month. Extremely cold weather can sometimes exacerbate her symptoms.   She continues to use Trelegy 100, one puff daily, and has albuterol  available for use as needed. She had a prednisone  taper at the beginning of the month due to a respiratory infection, which has since resolved. Her breathing has improved.    There was an incident where she was disconnected from her large oxygen  tank while her caregiver was out, but she managed to reconnect without harm. She does not currently require any refills for her medications and has not used a nebulizer treatment recently.   Last OV 11/19/2023: 89 yo ex-smoker for FU of  GOLD -4 COPD and nocturnal Oxygen .  PMH : Chronic cystitis, DDD, Anxiety, Depression, C diff 2017 and 2018, Diverticulosis, GERD, HTN, Hypothyroidism, PNA, Shingles, Spinal stenosis  05/2023 hosp for para influenza infection,copd exac  Priscilla Cantrell is an 89 year old female with COPD who presents for a follow-up visit to establish care. She was previously under the care of Dr. Shellia.   She is on nocturnal oxygen  therapy and has been using oxygen  for two years. She experiences ongoing dyspnea with variability in ambulating short distances. She recalls a previous episode of severe bronchitis or COPD exacerbation managed with prednisone . Her current medications include Trelegy and Lasix , though she is uncertain about the necessity of Lasix . She  also uses a nebulizer with albuterol  at home.   She has a cough with clear phlegm and no recent changes in the color of the phlegm. Her blood pressure was elevated today, and she is on two antihypertensive medications. She visited the ER two weeks ago due to prolonged wait times and did not receive treatment for her blood pressure. She has a follow-up appointment with her primary care physician next week.       Significant tests/ events reviewed   Spirometry 01/10/09>>FEV1 0.97(52%), FEV1% 47 RAST 05/04/17 >> negative, IgE 8 Spirometry 05/14/17 >> FEV1 0.91 (55%), FEV1% 48 PFT 10/29/18 >> FEV1 1.05 (68%), FEV1% 52, TLC 4.29 (96%), DLCO 44% PSG 11/14/11 >> AHI 0.3  ONO with RA 07/31/16 >> test time 6 hrs 41 min.  Average SpO2 87%, low SpO2 74%.  Spent 4 hrs 13 min with SpO2 < 88%. ONO with 2 liters 07/24/21 >> test time 3 hrs 58 min.  Baseline SpO2 97%, low SpO2 84%.  Spent 19 sec with SpO2 < 88%.     TEST/EVENTS :   Allergies[1]  Immunization History  Administered Date(s) Administered   Fluad Quad(high Dose 65+) 10/05/2018, 10/25/2019, 12/07/2020   Fluad Trivalent(High Dose 65+) 10/23/2022   INFLUENZA, HIGH DOSE SEASONAL PF 11/07/2016, 11/04/2017   Influenza Split 11/04/2010, 10/23/2011   Influenza Whole 10/04/2009   Influenza,inj,Quad PF,6+ Mos 10/07/2012, 10/11/2014   Influenza-Unspecified 11/07/2013, 11/01/2015   Moderna Sars-Covid-2 Vaccination 02/14/2019, 03/15/2019, 12/15/2019   PNEUMOCOCCAL CONJUGATE-20 07/17/2020   Pfizer Covid-19 Vaccine Bivalent Booster 59yrs & up 12/19/2020  Pneumococcal Conjugate-13 04/29/2013   Pneumococcal Polysaccharide-23 02/03/2005, 11/28/2010, 11/01/2018   Td 02/04/2008, 03/07/2010   Tdap 03/07/2020   Zoster Recombinant(Shingrix) 10/30/2016, 12/29/2016   Zoster, Live 02/10/2013    Past Medical History:  Diagnosis Date   Allergic rhinitis    with upper airway cough: as of 07/2017 pulm f/u she was instructed to use astelin , flonase , and saline  more consistently   Arthritis of left shoulder    01/2024   BPPV (benign paroxysmal positional vertigo) 03/2018   C. difficile colitis    Chest pain, non-cardiac    Cardiac CT showed no signif obstructive dz, mildly elevated calcium  level (Dr. Pietro).   Chronic cystitis with hematuria    Dr. Roman   Chronic diastolic heart failure (HCC)    Chronic hypoxemic respiratory failure (HCC) 02/02/2015   2L oxygen  24/7   Chronic renal insufficiency, stage 2 (mild)    GFR approx 60   Cold hands    NCS/EMGs normal.  Hand dysesthesias per neuro--reassured   COPD (chronic obstructive pulmonary disease) (HCC)    GOLD 4.  spirometry 01/10/09 FEV 0.97(52%), FEV1% 47.  With chronic bronchitis as of 07/2017.   DDD (degenerative disc disease), cervical    1998 MRI C5-6 impingemt (left).  MRI 2021 (neurol) 1 level canal stenosis, 1 level foraminal stenosis--->PT   DDD (degenerative disc disease), lumbar    Depression with anxiety 04/15/2011   Diarrheal disease Summer 2017   ? 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 Dr Luis).  As of 10/05/15 pt reports GI dx'd her with C diff and rx'd more flagyl  and she is also on cholestyramine .  As of 02/2016, pt's sx's resolved completely.   Diverticulosis of colon    Procto '97 and colonoscopy 2002   Dysplastic nevus of upper extremity 04/2014   R tricep (Dr. Livingston)   Edema of both lower extremities due to peripheral venous insufficiency    Varicosities bilat, swelling L>R.  R baker's cyst.  Got vein clinic eval 02/2018->sclerotherapy recommended but since no hemorrhage or ulceration, insurance will not cover the procedure.   Family history of adverse reaction to anesthesia    mother died when patient age 67 receiving anesthesia for thyroid  surgery   Fibrocystic breast disease    w/fibroadenoma.  Bx's showed NO atypia (Dr. Merrilyn).  Mammo neg 2008.   GERD  (gastroesophageal reflux disease)    History of double vision    Ophthalmologist, Dr. Cleatus, is further evaluating this with MRI  orbits and limited brain (myesthenia gravis testing neg 07/2010)   History of home oxygen  therapy    uses 2 liters at hs   Hypertension    EKG 03/2010 normal   Hypothyroidism    Hashimoto's, dx'd 1989   Idiopathic peripheral neuropathy 04/2018   Sensory (numb feet) S1 distribution bilat, c/w spinal stenosis sensory neuropathy (no pain). 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  Labs [place], MRI C spine->some spondylosis/stenosis.  UE NCS/EMS: normal 04/2019.   Lesion of right native kidney 2017   found on CT 02/2015.  F/u MRI 03/31/15 showed it to be smaller and less likely of any significance but repeat CT renal protocol in 71mo was recommended by radiology.  This was done 10/26/15 and showed no interval change in the lesion, but b/c the lesion enhances with contrast, radiol rec'd urol consult (b/c pap renal RCC could not be excluded).  Saw urol 03/06/16--stable  on u/s 09/2016 and 09/2017.   OAB (overactive bladder)    Osteoarthritis, hip, bilateral    she is s/p bilat THA as of 02/2018.   Osteoporosis    radius, T score -2.5->rec'd alendronate  08/2019->plan rpt DEXA 08/2021.   PAF (paroxysmal atrial fibrillation) (HCC)    Pneumonia    Postmenopausal atrophic vaginitis    improved with estrace 2 X/week.   Prolapse of vaginal cuff after hysterectomy    10/2018: Dr. Louvenia. Mild colpocele 04/2019 per GYN (no surgery)   Pulmonary nodule    CT chest 12/10, June 2011.  No nodule on CT 06/2010.   Recurrent Clostridium difficile diarrhea    Episode 09/19/2016: resolved with prolonged course of flagyl .  11/2016 recurrence treated with 10d of Dificid (Fidaxomicin) by GI (Dr. Luis).   Recurrent UTI    likely related to pt's atrophic vaginitis.  Urol started vaginal estrace 03/2016.   Rib fracture 09/2018   R 7th,  laterally-->sustained when her dog pulled her over and she fell.   RLS (restless legs syndrome)    neuro->gabapentin  trial 2020/21   Shingles 02/2014   R abd/flank   Spinal stenosis of lumbar region with neurogenic claudication    2008 MRI (L3-4, L4-5, L5-S1).  +S1 distribution sensory loss bilat    Tobacco History: Tobacco Use History[2] Counseling given: Not Answered   Outpatient Medications Prior to Visit  Medication Sig Dispense Refill   acetaminophen  (TYLENOL ) 325 MG tablet Take 2 tablets (650 mg total) by mouth every 6 (six) hours.     albuterol  (PROVENTIL ) (2.5 MG/3ML) 0.083% nebulizer solution Take 2.5 mg by nebulization every 6 (six) hours as needed for wheezing or shortness of breath.     albuterol  (VENTOLIN  HFA) 108 (90 Base) MCG/ACT inhaler INHALE 2 PUFFS BY MOUTH EVERY 4 HOURS AS NEEDED FOR WHEEZING OR SHORTNESS OF BREATH 17 g 3   apixaban  (ELIQUIS ) 2.5 MG TABS tablet Take 1 tablet (2.5 mg total) by mouth 2 (two) times daily. 180 tablet 3   diltiazem  (CARDIZEM  CD) 240 MG 24 hr capsule Take 1 capsule (240 mg total) by mouth daily. 30 capsule 0   fluticasone  (FLONASE ) 50 MCG/ACT nasal spray Place 2 sprays into both nostrils daily. 16 g 6   furosemide  (LASIX ) 20 MG tablet 1-3 tabs po every day as needed for legs swelling     guaiFENesin  (MUCINEX ) 600 MG 12 hr tablet Take 2 tablets (1,200 mg total) by mouth 2 (two) times daily. 30 tablet 0   irbesartan  (AVAPRO ) 75 MG tablet TAKE 1 TABLET BY MOUTH EVERY DAY 30 tablet 0   levothyroxine  (SYNTHROID ) 100 MCG tablet Take 1 tablet (100 mcg total) by mouth daily. 30 tablet 1   metoprolol  succinate (TOPROL -XL) 25 MG 24 hr tablet Take 1 tablet (25 mg total) by mouth daily. 30 tablet 0   Misc. Devices (PULSE OXIMETER) MISC Check oxygen  level as needed 1 each 1   pantoprazole  (PROTONIX ) 40 MG tablet TAKE 1 TABLET(40 MG) BY MOUTH TWICE DAILY 180 tablet 1   potassium chloride  SA (KLOR-CON  M) 20 MEQ tablet Take 1 tablet (20 mEq total) by mouth  daily. 30 tablet 0   predniSONE  (DELTASONE ) 10 MG tablet 4 every day x 2d, 3 every day x 2d, 2 every day x 2d, 1 every day x 2d 20 tablet 0   venlafaxine  (EFFEXOR ) 50 MG tablet TAKE 2 TABLETS BY MOUTH EVERY MORNING AND 1 TABLET EVERY NIGHT AT BEDTIME 90 tablet 1   Fluticasone -Umeclidin-Vilant (TRELEGY ELLIPTA )  100-62.5-25 MCG/ACT AEPB INHALE 1 PUFF INTO THE LUNGS DAILY 180 each 0   No facility-administered medications prior to visit.     Review of Systems: as per hpi  Constitutional:   No  weight loss, night sweats,  Fevers, chills, fatigue, or  lassitude.  HEENT:   No headaches,  Difficulty swallowing,  Tooth/dental problems, or  Sore throat,                No sneezing, itching, ear ache, nasal congestion, post nasal drip,   CV:  No chest pain,  Orthopnea, PND, swelling in lower extremities, anasarca, dizziness, palpitations, syncope.   GI  No heartburn, indigestion, abdominal pain, nausea, vomiting, diarrhea, change in bowel habits, loss of appetite, bloody stools.   Resp: No shortness of breath with exertion or at rest.  No excess mucus, no productive cough,  No non-productive cough,  No coughing up of blood.  No change in color of mucus.  No wheezing.  No chest wall deformity  Skin: no rash or lesions.  GU: no dysuria, change in color of urine, no urgency or frequency.  No flank pain, no hematuria   MS:  No joint pain or swelling.  No decreased range of motion.  No back pain.    Physical Exam  BP (!) 179/72   Pulse 63   Ht 5' 2 (1.575 m)   Wt 110 lb (49.9 kg)   SpO2 91% Comment: 2 liters  BMI 20.12 kg/m   GEN: A/Ox3; pleasant , NAD, thin.  Sitting in wheelchair wearing oxygen    HEENT:  Animas/AT,  EACs-clear, TMs-wnl, NOSE-clear, THROAT-clear, no lesions, no postnasal drip or exudate noted.   NECK:  Supple w/ fair ROM; no JVD; normal carotid impulses w/o bruits; no thyromegaly or nodules palpated; no lymphadenopathy.    RESP  Clear  P & A; w/o, wheezes/ rales/ or  rhonchi. no accessory muscle use, no dullness to percussion  CARD:  RRR, no m/r/g, no peripheral edema, pulses intact, no cyanosis or clubbing.  GI:   Soft & nt; nml bowel sounds; no organomegaly or masses detected.   Musco: Warm bil, no deformities or joint swelling noted.   Neuro: alert, no focal deficits noted.    Skin: Warm, no lesions or rashes    Lab Results:  CBC    Component Value Date/Time   WBC 6.6 12/24/2023 1133   RBC 4.63 12/24/2023 1133   HGB 13.1 12/24/2023 1133   HCT 40.5 12/24/2023 1133   PLT 236 12/24/2023 1133   MCV 87.5 12/24/2023 1133   MCH 28.3 12/24/2023 1133   MCHC 32.3 12/24/2023 1133   RDW 14.9 12/24/2023 1133   LYMPHSABS 1.1 12/24/2023 1133   MONOABS 0.6 12/24/2023 1133   EOSABS 0.1 12/24/2023 1133   BASOSABS 0.0 12/24/2023 1133    BMET    Component Value Date/Time   NA 142 12/24/2023 1133   K 3.7 12/24/2023 1133   CL 105 12/24/2023 1133   CO2 26 12/24/2023 1133   GLUCOSE 89 12/24/2023 1133   BUN 16 12/24/2023 1133   CREATININE 0.73 12/24/2023 1133   CREATININE 0.95 06/28/2021 1553   CALCIUM  9.5 12/24/2023 1133   GFRNONAA >60 12/24/2023 1133   GFRAA >60 12/10/2017 0559    BNP    Component Value Date/Time   BNP 433.8 (H) 06/03/2023 0416    ProBNP No results found for: PROBNP  Imaging: No results found.  methylPREDNISolone  acetate (DEPO-MEDROL ) injection 40 mg     Date  Action Dose Route User   01/08/2024 1649 Given 40 mg Intra-articular (Left Arm) Fleta Care D, CMA          Latest Ref Rng & Units 10/29/2018    1:56 PM  PFT Results  FVC-Pre L 2.01   FVC-Predicted Pre % 96   FVC-Post L 2.03   FVC-Predicted Post % 97   Pre FEV1/FVC % % 52   Post FEV1/FCV % % 52   FEV1-Pre L 1.04   FEV1-Predicted Pre % 67   FEV1-Post L 1.05   DLCO uncorrected ml/min/mmHg 7.25   DLCO UNC% % 44   DLVA Predicted % 51   TLC L 4.29   TLC % Predicted % 96   RV % Predicted % 76     No results found for:  NITRICOXIDE   Assessment & Plan:   Assessment & Plan COPD with chronic bronchitis and emphysema (HCC)  Chronic respiratory failure with hypoxia (HCC)  Assessment and Plan Assessment & Plan Chronic obstructive pulmonary disease COPD stable, no exacerbation, breathing at baseline, resolved recent URI. - Continue Trelegy 100 mcg, one puff daily. - Use albuterol  as needed for bronchospasm. - No current need for prednisone .   Return in about 6 months (around 08/23/2024) for w/ Dr. Jude, COPD.  Candis Dandy, PA-C 02/24/2024      [1]  Allergies Allergen Reactions   Losartan  Other (See Comments)    hyperkalemia   Penicillins Hives, Swelling and Rash    Has patient had a PCN reaction causing immediate rash, facial/tongue/throat swelling, SOB or lightheadedness with hypotension: YES Has patient had a PCN reaction causing severe rash involving mucus membranes or skin necrosis: NO Has patient had a PCN reaction that required hospitalization NO Has patient had a PCN reaction occurring within the last 10 years: NO If all of the above answers are NO, then may proceed with Cephalosporin use.    Cefixime [Kdc:Cefixime] Other (See Comments)    unspecified   Gabapentin  Other (See Comments)    Feels woozy   Indocin [Indomethacin] Other (See Comments)    Painful tongue and throat   Singulair  [Montelukast  Sodium]     Chest tightness, decreased mental clarity   Sulfa Antibiotics Other (See Comments)    unspecified   Ace Inhibitors Other (See Comments)    cough   Statins Other (See Comments)    Myalgias   [2]  Social History Tobacco Use  Smoking Status Former   Current packs/day: 0.00   Average packs/day: 1.5 packs/day for 34.0 years (51.0 ttl pk-yrs)   Types: Cigarettes   Start date: 39   Quit date: 02/03/1990   Years since quitting: 34.0  Smokeless Tobacco Never   "

## 2024-02-24 NOTE — Patient Instructions (Signed)
 Take 150mg  of irbesartan  once a day.

## 2024-03-09 ENCOUNTER — Encounter: Payer: Self-pay | Admitting: Family Medicine

## 2024-03-09 ENCOUNTER — Ambulatory Visit: Admitting: Family Medicine

## 2024-03-09 VITALS — BP 168/90 | HR 69 | Temp 97.3°F | Ht 62.0 in | Wt 114.8 lb

## 2024-03-09 DIAGNOSIS — I10 Essential (primary) hypertension: Secondary | ICD-10-CM | POA: Diagnosis not present

## 2024-03-09 MED ORDER — IRBESARTAN 150 MG PO TABS: ORAL_TABLET | ORAL | 0 refills | Status: AC

## 2024-03-09 NOTE — Progress Notes (Signed)
 OFFICE VISIT  03/09/2024  CC:  Chief Complaint  Patient presents with   Medical Management of Chronic Issues    Patient is a 89 y.o. female who presents accompanied by boyfriend Carlin for 2-week follow-up uncontrolled hypertension. A/P as of last visit: #1 COPD exacerbation, resolved. Continue Trelegy Ellipta  1 puff daily and albuterol  every 6 hours as needed.  Continue 2 L O2 24/7.   2.  Uncontrolled hypertension. Increase irbesartan  to 150 mg daily. She will try to get a blood pressure cuff and monitor at home periodically.  Will see her back in 2 weeks.  Check bmet at that time.   #3 thrush. Nystatin  suspension, 1 teaspoon 4 times daily x 15 days prescribed today.  INTERIM HX: No acute concerns.  Has not checked her blood pressure at home because she still needs to order a small cuff.  Breathing feels stable. No swelling in the legs.  Past Medical History:  Diagnosis Date   Allergic rhinitis    with upper airway cough: as of 07/2017 pulm f/u she was instructed to use astelin , flonase , and saline more consistently   Arthritis of left shoulder    01/2024   BPPV (benign paroxysmal positional vertigo) 03/2018   C. difficile colitis    Chest pain, non-cardiac    Cardiac CT showed no signif obstructive dz, mildly elevated calcium  level (Dr. Pietro).   Chronic cystitis with hematuria    Dr. Roman   Chronic diastolic heart failure (HCC)    Chronic hypoxemic respiratory failure (HCC) 02/02/2015   2L oxygen  24/7   Chronic renal insufficiency, stage 2 (mild)    GFR approx 60   Cold hands    NCS/EMGs normal.  Hand dysesthesias per neuro--reassured   COPD (chronic obstructive pulmonary disease) (HCC)    GOLD 4.  spirometry 01/10/09 FEV 0.97(52%), FEV1% 47.  With chronic bronchitis as of 07/2017.   DDD (degenerative disc disease), cervical    1998 MRI C5-6 impingemt (left).  MRI 2021 (neurol) 1 level canal stenosis, 1 level foraminal stenosis--->PT   DDD  (degenerative disc disease), lumbar    Depression with anxiety 04/15/2011   Diarrheal disease Summer 2017   ? 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 Dr Luis).  As of 10/05/15 pt reports GI dx'd her with C diff and rx'd more flagyl  and she is also on cholestyramine .  As of 02/2016, pt's sx's resolved completely.   Diverticulosis of colon    Procto '97 and colonoscopy 2002   Dysplastic nevus of upper extremity 04/2014   R tricep (Dr. Livingston)   Edema of both lower extremities due to peripheral venous insufficiency    Varicosities bilat, swelling L>R.  R baker's cyst.  Got vein clinic eval 02/2018->sclerotherapy recommended but since no hemorrhage or ulceration, insurance will not cover the procedure.   Family history of adverse reaction to anesthesia    mother died when patient age 28 receiving anesthesia for thyroid  surgery   Fibrocystic breast disease    w/fibroadenoma.  Bx's showed NO atypia (Dr. Merrilyn).  Mammo neg 2008.   GERD (gastroesophageal reflux disease)    History of double vision    Ophthalmologist, Dr. Cleatus, is further evaluating this with MRI  orbits and limited brain (myesthenia gravis testing neg 07/2010)   History of home oxygen  therapy    uses 2 liters at hs   Hypertension    EKG 03/2010 normal   Hypothyroidism  Hashimoto's, dx'd 1989   Idiopathic peripheral neuropathy 04/2018   Sensory (numb feet) S1 distribution bilat, c/w spinal stenosis sensory neuropathy (no pain). 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  Labs Cowgill, MRI C spine->some spondylosis/stenosis.  UE NCS/EMS: normal 04/2019.   Lesion of right native kidney 2017   found on CT 02/2015.  F/u MRI 03/31/15 showed it to be smaller and less likely of any significance but repeat CT renal protocol in 34mo was recommended by radiology.  This was done 10/26/15 and  showed no interval change in the lesion, but b/c the lesion enhances with contrast, radiol rec'd urol consult (b/c pap renal RCC could not be excluded).  Saw urol 03/06/16--stable on u/s 09/2016 and 09/2017.   OAB (overactive bladder)    Osteoarthritis, hip, bilateral    she is s/p bilat THA as of 02/2018.   Osteoporosis    radius, T score -2.5->rec'd alendronate  08/2019->plan rpt DEXA 08/2021.   PAF (paroxysmal atrial fibrillation) (HCC)    Pneumonia    Postmenopausal atrophic vaginitis    improved with estrace 2 X/week.   Prolapse of vaginal cuff after hysterectomy    10/2018: Dr. Louvenia. Mild colpocele 04/2019 per GYN (no surgery)   Pulmonary nodule    CT chest 12/10, June 2011.  No nodule on CT 06/2010.   Recurrent Clostridium difficile diarrhea    Episode 09/19/2016: resolved with prolonged course of flagyl .  11/2016 recurrence treated with 10d of Dificid (Fidaxomicin) by GI (Dr. Luis).   Recurrent UTI    likely related to pt's atrophic vaginitis.  Urol started vaginal estrace 03/2016.   Rib fracture 09/2018   R 7th, laterally-->sustained when her dog pulled her over and she fell.   RLS (restless legs syndrome)    neuro->gabapentin  trial 2020/21   Shingles 02/2014   R abd/flank   Spinal stenosis of lumbar region with neurogenic claudication    2008 MRI (L3-4, L4-5, L5-S1).  +S1 distribution sensory loss bilat    Past Surgical History:  Procedure Laterality Date   ABDOMINAL HYSTERECTOMY  1978   no BSO per pt--nonmalignant reasons   APPENDECTOMY     BREAST CYST EXCISION     Last screening mammogram 10/2010 was normal.   CHOLECYSTECTOMY  2001   COLONOSCOPY  04/2005; 12/06/2015   2007 NORMAL.  2017 adenomatous polyp x 1.  Mod sigmoid diverticulosis (Dr. Luis).     DEXA  05/18/2017; 08/7019   2019 NORMAL (T-score 0.1).  08/2019->T score -2.5->alendronate  started.  03/2022 T score -2.7   HIP CLOSED REDUCTION Right 12/25/2015   Procedure: CLOSED REDUCTION HIP;  Surgeon: Selinda Belvie Gosling, MD;  Location: [facility];  Service: Orthopedics;  Laterality: Right;   NCV/EMS  10/2018   chronic and symmetric sensorimotor axonal polyneuropathy affecting the lower extremities   TONSILLECTOMY     TOTAL HIP ARTHROPLASTY Right 11/01/2014   Procedure: RIGHT TOTAL HIP ARTHROPLASTY;  Surgeon: Tanda Heading, MD;  Location: [facility];  Service: Orthopedics;  Laterality: Right;   TOTAL HIP ARTHROPLASTY Left 12/08/2017   Procedure: LEFT TOTAL HIP ARTHROPLASTY ANTERIOR APPROACH;  Surgeon: Ernie Cough, MD;  Location: [facility];  Service: Orthopedics;  Laterality: Left;  70 mins   TOTAL HIP REVISION Right 02/10/2023   Procedure: RIGHT TOTAL HIP REVISION;  Surgeon: Ernie Cough, MD;  Location: [facility];  Service: Orthopedics;  Laterality: Right;   TRANSTHORACIC ECHOCARDIOGRAM  08/2014   2016 EF 60-65%, grade I diast dysfxn. 06/21/21 unchanged.  05/2023 EF  60-65%, grd I DD, sev elev pulm art pressure, severe tricusp regurg   TUBAL LIGATION  1974    Outpatient Medications Prior to Visit  Medication Sig Dispense Refill   acetaminophen  (TYLENOL ) 325 MG tablet Take 2 tablets (650 mg total) by mouth every 6 (six) hours.     albuterol  (PROVENTIL ) (2.5 MG/3ML) 0.083% nebulizer solution Take 2.5 mg by nebulization every 6 (six) hours as needed for wheezing or shortness of breath.     albuterol  (VENTOLIN  HFA) 108 (90 Base) MCG/ACT inhaler INHALE 2 PUFFS BY MOUTH EVERY 4 HOURS AS NEEDED FOR WHEEZING OR SHORTNESS OF BREATH 17 g 3   apixaban  (ELIQUIS ) 2.5 MG TABS tablet Take 1 tablet (2.5 mg total) by mouth 2 (two) times daily. 180 tablet 3   diltiazem  (CARDIZEM  CD) 240 MG 24 hr capsule Take 1 capsule (240 mg total) by mouth daily. 30 capsule 0   fluticasone  (FLONASE ) 50 MCG/ACT nasal spray Place 2 sprays into both nostrils daily. 16 g 6   Fluticasone -Umeclidin-Vilant (TRELEGY ELLIPTA ) 100-62.5-25 MCG/ACT AEPB Inhale 1 puff into the lungs daily. 180 each 0   furosemide  (LASIX ) 20 MG tablet 1-3 tabs po every day as  needed for legs swelling     guaiFENesin  (MUCINEX ) 600 MG 12 hr tablet Take 2 tablets (1,200 mg total) by mouth 2 (two) times daily. 30 tablet 0   irbesartan  (AVAPRO ) 150 MG tablet Take 1 tablet (150 mg total) by mouth daily. 30 tablet 0   levothyroxine  (SYNTHROID ) 100 MCG tablet Take 1 tablet (100 mcg total) by mouth daily. 30 tablet 1   metoprolol  succinate (TOPROL -XL) 25 MG 24 hr tablet Take 1 tablet (25 mg total) by mouth daily. 30 tablet 0   Misc. Devices (PULSE OXIMETER) MISC Check oxygen  level as needed 1 each 1   nystatin  (MYCOSTATIN ) 100000 UNIT/ML suspension Take 5 mLs (500,000 Units total) by mouth 4 (four) times daily. For 15 days 300 mL 0   pantoprazole  (PROTONIX ) 40 MG tablet TAKE 1 TABLET(40 MG) BY MOUTH TWICE DAILY 180 tablet 1   potassium chloride  SA (KLOR-CON  M) 20 MEQ tablet Take 1 tablet (20 mEq total) by mouth daily. 30 tablet 0   predniSONE  (DELTASONE ) 10 MG tablet 4 every day x 2d, 3 every day x 2d, 2 every day x 2d, 1 every day x 2d 20 tablet 0   venlafaxine  (EFFEXOR ) 50 MG tablet TAKE 2 TABLETS BY MOUTH EVERY MORNING AND 1 TABLET EVERY NIGHT AT BEDTIME 90 tablet 1   No facility-administered medications prior to visit.    Allergies[1]  Review of Systems As per HPI  PE:    03/09/2024    2:44 PM 02/24/2024    3:02 PM 02/24/2024    2:51 PM  Vitals with BMI  Height 5' 2    Weight 114 lbs 13 oz    BMI 20.99    Systolic  170 197  Diastolic  80 95     Physical Exam  Gen: Alert, well appearing.  Patient is oriented to person, place, time, and situation. AFFECT: pleasant, lucid thought and speech. CV: RRR, no m/r/g.   LUNGS: CTA bilat, nonlabored resps, good aeration in all lung fields. EXT: no clubbing or cyanosis.  no edema.    LABS:  Last metabolic panel Lab Results  Component Value Date   GLUCOSE 89 12/24/2023   NA 142 12/24/2023   K 3.7 12/24/2023   CL 105 12/24/2023   CO2 26 12/24/2023   BUN 16 12/24/2023  CREATININE 0.73 12/24/2023   GFRNONAA  >60 12/24/2023   CALCIUM  9.5 12/24/2023   PHOS 3.0 06/16/2023   PROT 6.6 12/24/2023   ALBUMIN 4.4 12/24/2023   BILITOT 0.6 12/24/2023   ALKPHOS 97 12/24/2023   AST 18 12/24/2023   ALT 13 12/24/2023   ANIONGAP 12 12/24/2023   IMPRESSION AND PLAN:   Uncontrolled hypertension. Increase irbesartan  to 1 and 1/2 of the 150 mg tabs daily. She will continue to try to get a blood pressure cuff and monitor at home periodically.  Will see her back in 2 weeks.   Basic metabolic panel today.  An After Visit Summary was printed and given to the patient.  FOLLOW UP: No follow-ups on file.  Signed:  Gerlene Hockey, MD           03/09/2024      [1]  Allergies Allergen Reactions   Losartan  Other (See Comments)    hyperkalemia   Penicillins Hives, Swelling and Rash    Has patient had a PCN reaction causing immediate rash, facial/tongue/throat swelling, SOB or lightheadedness with hypotension: YES Has patient had a PCN reaction causing severe rash involving mucus membranes or skin necrosis: NO Has patient had a PCN reaction that required hospitalization NO Has patient had a PCN reaction occurring within the last 10 years: NO If all of the above answers are NO, then may proceed with Cephalosporin use.    Cefixime [Kdc:Cefixime] Other (See Comments)    unspecified   Gabapentin  Other (See Comments)    Feels woozy   Indocin [Indomethacin] Other (See Comments)    Painful tongue and throat   Singulair  [Montelukast  Sodium]     Chest tightness, decreased mental clarity   Sulfa Antibiotics Other (See Comments)    unspecified   Ace Inhibitors Other (See Comments)    cough   Statins Other (See Comments)    Myalgias

## 2024-03-10 ENCOUNTER — Ambulatory Visit: Payer: Self-pay | Admitting: Family Medicine

## 2024-03-10 LAB — BASIC METABOLIC PANEL WITH GFR
BUN: 17 mg/dL (ref 6–23)
CO2: 31 meq/L (ref 19–32)
Calcium: 9.2 mg/dL (ref 8.4–10.5)
Chloride: 102 meq/L (ref 96–112)
Creatinine, Ser: 0.91 mg/dL (ref 0.40–1.20)
GFR: 56.99 mL/min — ABNORMAL LOW
Glucose, Bld: 78 mg/dL (ref 70–99)
Potassium: 4.2 meq/L (ref 3.5–5.1)
Sodium: 141 meq/L (ref 135–145)

## 2024-03-23 ENCOUNTER — Ambulatory Visit: Admitting: Family Medicine
